# Patient Record
Sex: Male | Born: 1965 | Race: Black or African American | Hispanic: No | Marital: Single | Smoking: Current some day smoker
Health system: Southern US, Community
[De-identification: ages and names within clinical notes are randomized; demographics above are authoritative.]

## PROBLEM LIST (undated history)

## (undated) DIAGNOSIS — G709 Myoneural disorder, unspecified: Secondary | ICD-10-CM

## (undated) DIAGNOSIS — R7303 Prediabetes: Secondary | ICD-10-CM

## (undated) DIAGNOSIS — N179 Acute kidney failure, unspecified: Secondary | ICD-10-CM

## (undated) DIAGNOSIS — F319 Bipolar disorder, unspecified: Secondary | ICD-10-CM

## (undated) DIAGNOSIS — E785 Hyperlipidemia, unspecified: Secondary | ICD-10-CM

## (undated) DIAGNOSIS — F419 Anxiety disorder, unspecified: Secondary | ICD-10-CM

## (undated) DIAGNOSIS — K635 Polyp of colon: Secondary | ICD-10-CM

## (undated) DIAGNOSIS — F32A Depression, unspecified: Secondary | ICD-10-CM

## (undated) DIAGNOSIS — M1990 Unspecified osteoarthritis, unspecified site: Secondary | ICD-10-CM

## (undated) DIAGNOSIS — G629 Polyneuropathy, unspecified: Secondary | ICD-10-CM

## (undated) DIAGNOSIS — K219 Gastro-esophageal reflux disease without esophagitis: Secondary | ICD-10-CM

## (undated) DIAGNOSIS — F209 Schizophrenia, unspecified: Secondary | ICD-10-CM

## (undated) DIAGNOSIS — B1910 Unspecified viral hepatitis B without hepatic coma: Secondary | ICD-10-CM

## (undated) DIAGNOSIS — F329 Major depressive disorder, single episode, unspecified: Secondary | ICD-10-CM

## (undated) DIAGNOSIS — I10 Essential (primary) hypertension: Secondary | ICD-10-CM

## (undated) DIAGNOSIS — F250 Schizoaffective disorder, bipolar type: Secondary | ICD-10-CM

## (undated) DIAGNOSIS — Z21 Asymptomatic human immunodeficiency virus [HIV] infection status: Secondary | ICD-10-CM

## (undated) DIAGNOSIS — B20 Human immunodeficiency virus [HIV] disease: Secondary | ICD-10-CM

## (undated) HISTORY — DX: Hyperlipidemia, unspecified: E78.5

## (undated) HISTORY — PX: HEMORROIDECTOMY: SUR656

## (undated) HISTORY — DX: Polyp of colon: K63.5

## (undated) HISTORY — DX: Essential (primary) hypertension: I10

## (undated) HISTORY — PX: TUMOR REMOVAL: SHX12

## (undated) HISTORY — DX: Anxiety disorder, unspecified: F41.9

## (undated) HISTORY — PX: COLONOSCOPY: SHX174

## (undated) HISTORY — DX: Gastro-esophageal reflux disease without esophagitis: K21.9

---

## 1997-09-12 ENCOUNTER — Encounter: Payer: Self-pay | Admitting: Emergency Medicine

## 1997-09-12 ENCOUNTER — Emergency Department (HOSPITAL_COMMUNITY): Admission: EM | Admit: 1997-09-12 | Discharge: 1997-09-12 | Payer: Self-pay | Admitting: Emergency Medicine

## 2000-01-11 ENCOUNTER — Emergency Department (HOSPITAL_COMMUNITY): Admission: EM | Admit: 2000-01-11 | Discharge: 2000-01-11 | Payer: Self-pay

## 2000-03-31 ENCOUNTER — Encounter: Payer: Self-pay | Admitting: Family Medicine

## 2000-03-31 ENCOUNTER — Ambulatory Visit (HOSPITAL_COMMUNITY): Admission: RE | Admit: 2000-03-31 | Discharge: 2000-03-31 | Payer: Self-pay | Admitting: Family Medicine

## 2000-04-15 ENCOUNTER — Emergency Department (HOSPITAL_COMMUNITY): Admission: EM | Admit: 2000-04-15 | Discharge: 2000-04-15 | Payer: Self-pay

## 2000-06-28 ENCOUNTER — Encounter: Payer: Self-pay | Admitting: Emergency Medicine

## 2000-06-28 ENCOUNTER — Inpatient Hospital Stay (HOSPITAL_COMMUNITY): Admission: EM | Admit: 2000-06-28 | Discharge: 2000-06-30 | Payer: Self-pay | Admitting: Emergency Medicine

## 2000-06-28 ENCOUNTER — Encounter: Payer: Self-pay | Admitting: Internal Medicine

## 2001-01-24 ENCOUNTER — Emergency Department (HOSPITAL_COMMUNITY): Admission: EM | Admit: 2001-01-24 | Discharge: 2001-01-24 | Payer: Self-pay | Admitting: Emergency Medicine

## 2001-07-16 ENCOUNTER — Emergency Department (HOSPITAL_COMMUNITY): Admission: EM | Admit: 2001-07-16 | Discharge: 2001-07-16 | Payer: Self-pay | Admitting: Emergency Medicine

## 2001-07-16 ENCOUNTER — Encounter: Payer: Self-pay | Admitting: Emergency Medicine

## 2011-08-11 ENCOUNTER — Emergency Department (HOSPITAL_COMMUNITY)
Admission: EM | Admit: 2011-08-11 | Discharge: 2011-08-11 | Disposition: A | Discharge status: Home / Self Care | Payer: Medicaid Other | Attending: Emergency Medicine | Admitting: Emergency Medicine

## 2011-08-11 ENCOUNTER — Encounter (HOSPITAL_COMMUNITY): Payer: Self-pay | Admitting: Family Medicine

## 2011-08-11 DIAGNOSIS — Z21 Asymptomatic human immunodeficiency virus [HIV] infection status: Secondary | ICD-10-CM | POA: Insufficient documentation

## 2011-08-11 DIAGNOSIS — F259 Schizoaffective disorder, unspecified: Secondary | ICD-10-CM

## 2011-08-11 DIAGNOSIS — Z79899 Other long term (current) drug therapy: Secondary | ICD-10-CM | POA: Insufficient documentation

## 2011-08-11 DIAGNOSIS — F1410 Cocaine abuse, uncomplicated: Secondary | ICD-10-CM

## 2011-08-11 HISTORY — DX: Asymptomatic human immunodeficiency virus (hiv) infection status: Z21

## 2011-08-11 HISTORY — DX: Human immunodeficiency virus (HIV) disease: B20

## 2011-08-11 LAB — CBC WITH DIFFERENTIAL/PLATELET
Basophils Absolute: 0 10*3/uL (ref 0.0–0.1)
Basophils Relative: 0 % (ref 0–1)
Eosinophils Relative: 0 % (ref 0–5)
HCT: 41.8 % (ref 39.0–52.0)
MCHC: 35.2 g/dL (ref 30.0–36.0)
MCV: 87.3 fL (ref 78.0–100.0)
Monocytes Absolute: 0.6 10*3/uL (ref 0.1–1.0)
RDW: 12.5 % (ref 11.5–15.5)

## 2011-08-11 LAB — POCT I-STAT, CHEM 8
HCT: 44 % (ref 39.0–52.0)
Hemoglobin: 15 g/dL (ref 13.0–17.0)
Potassium: 4 mEq/L (ref 3.5–5.1)
Sodium: 142 mEq/L (ref 135–145)

## 2011-08-11 LAB — ACETAMINOPHEN LEVEL: Acetaminophen (Tylenol), Serum: <15 ug/mL (ref 10–30)

## 2011-08-11 LAB — ETHANOL: Alcohol, Ethyl (B): <11 mg/dL (ref 0–11)

## 2011-08-11 MED ORDER — EMTRICITABINE-TENOFOVIR DF 200-300 MG PO TABS
1.0000 | ORAL_TABLET | Freq: Every day | ORAL | Status: DC
  Administered 2011-08-11: 1 via ORAL
  Filled 2011-08-11: qty 1

## 2011-08-11 MED ORDER — ACETAMINOPHEN 325 MG PO TABS: 650.0000 mg | ORAL_TABLET | ORAL | Status: DC | PRN

## 2011-08-11 MED ORDER — LORAZEPAM 1 MG PO TABS
1.0000 mg | ORAL_TABLET | Freq: Three times a day (TID) | ORAL | Status: DC | PRN
  Administered 2011-08-11: 1 mg via ORAL
  Filled 2011-08-11: qty 1

## 2011-08-11 MED ORDER — RITONAVIR 100 MG PO TABS
100.0000 mg | ORAL_TABLET | Freq: Two times a day (BID) | ORAL | Status: DC
  Filled 2011-08-11 (×2): qty 1

## 2011-08-11 MED ORDER — LURASIDONE HCL 40 MG PO TABS
40.0000 mg | ORAL_TABLET | Freq: Every day | ORAL | Status: DC
  Administered 2011-08-11: 40 mg via ORAL
  Filled 2011-08-11 (×4): qty 1

## 2011-08-11 MED ORDER — IBUPROFEN 600 MG PO TABS: 600.0000 mg | ORAL_TABLET | Freq: Three times a day (TID) | ORAL | Status: DC | PRN

## 2011-08-11 MED ORDER — ONDANSETRON HCL 4 MG PO TABS: 4.0000 mg | ORAL_TABLET | Freq: Three times a day (TID) | ORAL | Status: DC | PRN

## 2011-08-11 MED ORDER — NICOTINE 21 MG/24HR TD PT24: 21.0000 mg | MEDICATED_PATCH | Freq: Every day | TRANSDERMAL | Status: DC

## 2011-08-11 MED ORDER — ATAZANAVIR SULFATE 150 MG PO CAPS
300.0000 mg | ORAL_CAPSULE | Freq: Every day | ORAL | Status: DC
Start: 2011-08-11 — End: 2011-08-11
  Administered 2011-08-11: 300 mg via ORAL
  Filled 2011-08-11 (×2): qty 2

## 2011-08-11 MED ORDER — RITONAVIR 100 MG PO TABS
100.0000 mg | ORAL_TABLET | Freq: Two times a day (BID) | ORAL | Status: DC
  Administered 2011-08-11: 100 mg via ORAL
  Filled 2011-08-11 (×3): qty 1

## 2011-08-11 NOTE — ED Notes (Signed)
Patient anxious. States that "I wanted death." States that he is hearing voices and that the police just "snatched him up."

## 2011-08-11 NOTE — ED Notes (Signed)
Pt belongings placed in locker Psych 52.

## 2011-08-11 NOTE — ED Notes (Signed)
Pt up to bathroom and giving urine sample as well.

## 2011-08-11 NOTE — ED Provider Notes (Signed)
Patient has been cleared for discharge by Dr. Lenna Sciara of psychiatry. He denies any paranoia, SI or HI.  BP 139/82  Pulse 92  Temp 98.4 F (36.9 C) (Oral)  Resp 18  SpO2 97%   Ezequiel Essex, MD 08/11/11 1145

## 2011-08-11 NOTE — ED Notes (Signed)
Patient up to the desk asking for his clothes. Explained to patient that back in this section(psych ed) that he was only allowed to wear the blue scrubs. Patient states "I want to leave." Explained to him I would let the nurse know. RN Elmyra Ricks notified, will notify patient's nurse, Kenney Houseman.

## 2011-08-11 NOTE — ED Notes (Signed)
Pt could not urinate.

## 2011-08-11 NOTE — ED Notes (Signed)
Pt just walked up to doors leading out of the Psych ED and pushed on it to try to open it. Writer explained to pt that he cannot leave without the MD's permission. Pt said he can't stay here and where's the MD. Writer ensured pt that people are working on his case.

## 2011-08-11 NOTE — ED Notes (Signed)
Pt and belongings searched and wand by security. Placed in locker 1 triage. 1 white bag and a blue back pk.

## 2011-08-11 NOTE — ED Provider Notes (Signed)
History     CSN: [id number]  Arrival date & time 08/11/11  0354   First MD Initiated Contact with Patient 08/11/11 [phone]      Chief Complaint  Patient presents with  . Medical Clearance    (Consider location/radiation/quality/duration/timing/severity/associated sxs/prior treatment) Patient is a 46 y.o. male presenting with mental health disorder. The history is provided by the patient and the police. No language interpreter was used.  Mental Health Problem The primary symptoms include hallucinations. Episode onset: unknown. Chronicity: unknown.  The hallucinations began more than 1 month ago. The hallucinations appear to have been worsening since their onset. He has auditory hallucinations.  Precipitated by: unknown. The degree of incapacity that he is experiencing as a consequence of his illness is severe. Sequelae of the illness include an inability to care for self. Additional symptoms of the illness include flight of ideas and patient exhibits inattention/distractibility. Additional symptoms of the illness do not include no anhedonia or no insomnia. He admits to suicidal ideas. He does not have a plan to commit suicide. He does not contemplate harming himself. He has not already injured self. He does not contemplate injuring another person. He has not already  injured another person. Risk factors: none.    Past Medical History  Diagnosis Date  . Human immunodeficiency virus (HIV)     Past Surgical History  Procedure Date  . Tumor removal     From Chest    No family history on file.  History  Substance Use Topics  . Smoking status: Never Smoker   . Smokeless tobacco: Not on file  . Alcohol Use: No      Review of Systems  Psychiatric/Behavioral: Positive for hallucinations. Negative for self-injury. The patient does not have insomnia.   All other systems reviewed and are negative.    Allergies  Penicillins  Home Medications   Current Outpatient Rx  Name Route  Sig Dispense Refill  . ATAZANAVIR SULFATE 300 MG PO CAPS Oral Take 300 mg by mouth daily with breakfast.    . EMTRICITABINE-TENOFOVIR 200-300 MG PO TABS Oral Take 1 tablet by mouth daily.    Marland Kitchen LURASIDONE HCL 40 MG PO TABS Oral Take 40 mg by mouth daily with breakfast.    . RITONAVIR 100 MG PO CAPS Oral Take 100 mg by mouth 2 (two) times daily.      BP 139/82  Pulse 92  Temp 98.4 F (36.9 C) (Oral)  Resp 18  SpO2 97%  Physical Exam  Constitutional: He appears well-developed and well-nourished. No distress.  HENT:  Head: Normocephalic and atraumatic.  Mouth/Throat: Oropharynx is clear and moist.  Eyes: EOM are normal. Pupils are equal, round, and reactive to light.  Neck: Normal range of motion. Neck supple.  Cardiovascular: Normal rate and regular rhythm.   Pulmonary/Chest: Effort normal and breath sounds normal. He has no wheezes. He has no rales.  Abdominal: Soft. Bowel sounds are normal. There is no tenderness. There is no rebound and no guarding.  Musculoskeletal: Normal range of motion.  Neurological: He is alert.  Skin: Skin is warm and dry.  Psychiatric: He is agitated.    ED Course  Procedures (including critical care time)   Labs Reviewed  CBC WITH DIFFERENTIAL  URINE RAPID DRUG SCREEN (HOSP PERFORMED)  ETHANOL  ACETAMINOPHEN LEVEL  SALICYLATE LEVEL   No results found.   No diagnosis found.    MDM  Will need inpatient psychiatric stabilization        Jabree Pernice K  Doyle, MD 08/11/11 [phone]

## 2011-08-11 NOTE — ED Notes (Signed)
Pt up to desk calling the [facility]

## 2011-08-11 NOTE — ED Notes (Signed)
Psychiatrist in talking with pt.

## 2011-08-11 NOTE — ED Notes (Signed)
Spoke to EDP about pt wanting to leave instead of being placed for treatment. EDP said to put him on IVC, he cannot leave. Also, spoke to ACT to let them know what the EDP said and also that the pt is concerned about losing his bed at [facility]. ACT will talk to Dr Lenna Sciara and let me know how to proceed.

## 2011-08-11 NOTE — ED Notes (Signed)
Per [employer], patient was picked up from the middle of [place] saying that "people are trying to kill me." Patient anxious.

## 2011-08-11 NOTE — ED Notes (Signed)
Palumbo, MD at bedside. 

## 2011-08-11 NOTE — ED Notes (Addendum)
Pt said he takes Norvir as well and that he has to take all three pills in order for them to work. Called EDP and he will order medication for pt. Pt also wants to call [facility] to have them hold his clothes and bed.

## 2011-08-11 NOTE — Consult Note (Signed)
Reason for Consult: Schizophrenia and cocaine abuse  Referring Physician: Dr. Leonie Green is an 46 y.o. male.  HPI: Patient was seen and chart reviewed. Patient has been suffering with the chronic schizophrenia and he he . paranoid type, who has been partially compliant with his medication. Patient wants to take a break from taking regular medications for few days at time and at the same time he want to socialize with the people who has been smoking pot or cocaine. Patient was found wandering in the middle of the street by the police, who brought him to the hospital. Patient was reportedly noncooperative and aggressive with the police officers. Patient has denied symptoms of depression, mania, anxiety and psychosis. He has no suicidal ideations or homicidal ideations. He was trying to keep himself safe by walking in the middle of the street. Patient endorses he used the Cocaine with the friends before hallucinations, paranoid and not taken medication for few days.   Patient has been receiving outpatient psychiatric services from the [facility]. He was positive for HIV for several years and been treated with medication. Patient has a sister, who is working as a Research officer, trade union but patient does not want to bother her. He was intended to live by himself, and currently staying at  [facility] for the last one week. Patient is concerned and worried about losing his position at [employer] if he stay in hospital. Patient contract for safety. He has a her mother and the 72 years old son who lives in [place]. Patient has been living in [place] since 1997. Reportedly he was on probation in the past, which he does not want to give details about it.   Past Medical History  Diagnosis Date  . Human immunodeficiency virus (HIV)     Past Surgical History  Procedure Date  . Tumor removal     From Chest    No family history on file.  Social History:  reports that he has  never smoked. He does not have any smokeless tobacco history on file. He reports that he does not drink alcohol or use illicit drugs.  Allergies:  Allergies  Allergen Reactions  . Penicillins     Medications: I have reviewed the patient's current medications.  Results for orders placed during the hospital encounter of 08/11/11 (from the past 48 hour(s))  CBC WITH DIFFERENTIAL     Status: Normal   Collection Time   08/11/11  4:50 AM      Component Value Range Comment   WBC 9.0  4.0 - 10.5 K/uL    RBC 4.79  4.22 - 5.81 MIL/uL    Hemoglobin 14.7  13.0 - 17.0 g/dL    HCT 41.8  39.0 - 52.0 %    MCV 87.3  78.0 - 100.0 fL    MCH 30.7  26.0 - 34.0 pg    MCHC 35.2  30.0 - 36.0 g/dL    RDW 12.5  11.5 - 15.5 %    Platelets 225  150 - 400 K/uL    Neutrophils Relative 75  43 - 77 %    Neutro Abs 6.8  1.7 - 7.7 K/uL    Lymphocytes Relative 18  12 - 46 %    Lymphs Abs 1.6  0.7 - 4.0 K/uL    Monocytes Relative 6  3 - 12 %    Monocytes Absolute 0.6  0.1 - 1.0 K/uL    Eosinophils Relative 0  0 -  5 %    Eosinophils Absolute 0.0  0.0 - 0.7 K/uL    Basophils Relative 0  0 - 1 %    Basophils Absolute 0.0  0.0 - 0.1 K/uL   ETHANOL     Status: Normal   Collection Time   08/11/11  4:50 AM      Component Value Range Comment   Alcohol, Ethyl (B) <11  0 - 11 mg/dL   ACETAMINOPHEN LEVEL     Status: Normal   Collection Time   08/11/11  4:50 AM      Component Value Range Comment   Acetaminophen (Tylenol), Serum <15.0  10 - 30 ug/mL   SALICYLATE LEVEL     Status: Abnormal   Collection Time   08/11/11  4:50 AM      Component Value Range Comment   Salicylate Lvl 123456 (*) 2.8 - 20.0 mg/dL   URINE RAPID DRUG SCREEN (HOSP PERFORMED)     Status: Abnormal   Collection Time   08/11/11  8:17 AM      Component Value Range Comment   Opiates NONE DETECTED  NONE DETECTED    Cocaine POSITIVE (*) NONE DETECTED    Benzodiazepines NONE DETECTED  NONE DETECTED    Amphetamines NONE DETECTED  NONE DETECTED     Tetrahydrocannabinol NONE DETECTED  NONE DETECTED    Barbiturates NONE DETECTED  NONE DETECTED     No results found.  No depression, No psychosis and Positive for illegal drug usage and non compliance Blood pressure 139/82, pulse 92, temperature 98.4 F (36.9 C), temperature source Oral, resp. rate 18, SpO2 97.00%.   Assessment/Plan: Schizoaffective disorder Cocaine abuse Partially compliant with medications  Paln; Discharge to out patient psychiatric services at [facility] and has no medication changes made during this visit. His current medication is Latuda 40mg  Qam for psychosis. No changes were made during this visit.  Juris Gosnell,JANARDHAHA R. 08/11/2011, 11:07 AM

## 2011-08-11 NOTE — ED Notes (Signed)
Pharmacy is putting in a Norvir dose to be given now.

## 2011-08-11 NOTE — BH Assessment (Signed)
Assessment Note   Dylan [name] is an 46 y.o. male. Patient reports that people from the grave are following him. Patient reports that he has not taken his medicine for 2 or 3 days. Patient was found wandering and was brought in by the police. Documentation in the file reports that he became aggressive with the police officers. Patient reports that he is not able to see the people following him. Patient states that he can only hear them. Patient denies any HI. Patient reports SI without a plan to hurt himself. Patient reports that he just wants to die. He does not want to hurt anyone else. Patient speech was rapid with flight of ideas and word salad. Patient was not able to maintain a single thought during the assessment. Patient denies any substance abuse. Patient reports receiving mental health services with a psychiatrist for medication management.   Pt had psychiatric evaluation with Dr. Louretta Shorten who recommended pt to be d/c with instructions to follow up with [facility], his current provider.  Axis I: Schizophrenia, unspecified and Polysubstance Abuse Axis II: Deferred Axis III:  Past Medical History  Diagnosis Date  . Human immunodeficiency virus (HIV)    Axis IV: economic problems, housing problems and other psychosocial or environmental problems Axis V: Please refer to Consult Note  Past Medical History:  Past Medical History  Diagnosis Date  . Human immunodeficiency virus (HIV)     Past Surgical History  Procedure Date  . 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  Social History:  reports that he has never smoked. He does not have any smokeless tobacco history on file. He reports that he does not drink alcohol or use illicit drugs.  Additional Social History:     CIWA: CIWA-Ar BP: 109/59 mmHg Pulse Rate: 76  COWS:    Allergies:  Allergies  Allergen Reactions  . Penicillins     Home Medications:  (Not in a hospital  admission)  OB/GYN Status:  No LMP for male patient.  General Assessment Data Location of Assessment: [facility] ACT Assessment: Yes Living Arrangements: Other (Comment) (Homeless - staying at [facility]) Can pt return to current living arrangement?: Yes Admission Status: Voluntary Is patient capable of signing voluntary admission?: Yes Transfer from: [facility] Referral Source: Self/Family/Friend  Education Status Is patient currently in school?: No  Risk to self Suicidal Ideation: Yes-Currently Present Suicidal Intent: Yes-Currently Present Is patient at risk for suicide?: Yes Suicidal Plan?: No-Not Currently/Within Last 6 Months Access to Means: No What has been your use of drugs/alcohol within the last 12 months?: none reported Previous Attempts/Gestures: No How many times?: 0  Other Self Harm Risks: 0 Triggers for Past Attempts: Unpredictable;Unknown Intentional Self Injurious Behavior: None Family Suicide History: Unknown Recent stressful life event(s): Other (Comment) Persecutory voices/beliefs?: Yes Depression: Yes Depression Symptoms: Fatigue;Isolating Substance abuse history and/or treatment for substance abuse?: No Suicide prevention information given to non-admitted patients: Yes  Risk to Others Homicidal Ideation: No Thoughts of Harm to Others: No Current Homicidal Intent: No Current Homicidal Plan: No Access to Homicidal Means: No Identified Victim: none reported History of harm to others?: No Assessment of Violence: On admission Violent Behavior Description: fighting with the police officer Does patient have access to weapons?: No Criminal Charges Pending?: No Does patient have a court date: No  Psychosis Hallucinations: Auditory Delusions: None noted  Mental Status Report Appear/Hygiene: Other (Comment) Eye Contact: Poor Motor Activity: Freedom  of movement Speech: Incoherent;Tangential;Word salad Level of Consciousness: Quiet/awake Mood:  Anxious;Depressed Affect: Apprehensive;Irritable Anxiety Level: Minimal Thought Processes: Circumstantial;Tangential;Flight of Ideas Judgement: Impaired Orientation: Not oriented Obsessive Compulsive Thoughts/Behaviors: None  Cognitive Functioning Concentration: Decreased Memory: Recent Impaired;Remote Impaired IQ: Average Insight: Poor Impulse Control: Poor Appetite: Fair Weight Loss: 0  Weight Gain: 0  Sleep: Decreased Total Hours of Sleep: 5  Vegetative Symptoms: None  ADLScreening [facility] Assessment Services) Patient's cognitive ability adequate to safely complete daily activities?: Yes Patient able to express need for assistance with ADLs?: Yes Independently performs ADLs?: Yes  Abuse/Neglect [facility]) Physical Abuse: Denies Verbal Abuse: Denies Sexual Abuse: Denies  Prior Inpatient Therapy Prior Inpatient Therapy: No Prior Therapy Dates: na Prior Therapy Facilty/Provider(s): na Reason for Treatment: na  Prior Outpatient Therapy Prior Outpatient Therapy: Yes Prior Therapy Dates: on going Prior Therapy Facilty/Provider(s): unable to remember Reason for Treatment: medication management   ADL Screening (condition at time of admission) Patient's cognitive ability adequate to safely complete daily activities?: Yes Patient able to express need for assistance with ADLs?: Yes Independently performs ADLs?: Yes       Abuse/Neglect Assessment (Assessment to be complete while patient is alone) Physical Abuse: Denies Verbal Abuse: Denies Sexual Abuse: Denies Values / Beliefs Cultural Requests During Hospitalization: None Spiritual Requests During Hospitalization: None        Additional Information 1:1 In Past 12 Months?: No CIRT Risk: No Elopement Risk: No Does patient have medical clearance?: Yes     Disposition:  Disposition Disposition of Patient: Referred to (Follow up with [facility]) Patient referred to: Other (Comment) (Psych MD referred pt back to  [facility])  On Site Evaluation by:   Reviewed with Physician:     Milagros Evener 08/11/2011 12:21 PM

## 2011-08-11 NOTE — BH Assessment (Signed)
Assessment Note   Dylan [name] is an 46 y.o. male.   Patient reports that people from the grave are following him.  Patient reports that he has not taken his medicine for 2 or 3 days.  Patient was found wandering and was brought in by the police.  Documentation in the file reports that he became aggressive with the police officers.  Patient reports that he is not able to see the people following him.  Patient states that he can only hear them.  Patient denies any HI. Patient reports SI without a plan to hurt himself. Patient reports that he just wants to die. He does not want to hurt anyone else. Patient speech was rapid with flight of ideas and word salad. Patient was not able to maintain a single thought during the assessment.  Patient denies any substance abuse. Patient reports receiving mental health services with a psychiatrist for medication management.   Axis I: Schizoaffective Disorder Axis II: Deferred Axis III:  Past Medical History  Diagnosis Date  . Human immunodeficiency virus (HIV)    Axis IV: economic problems, housing problems, occupational problems, other psychosocial or environmental problems, problems related to social environment, problems with access to health care services and problems with primary support group Axis V: 11-20 some danger of hurting self or others possible OR occasionally fails to maintain minimal personal hygiene OR gross impairment in communication  Past Medical History:  Past Medical History  Diagnosis Date  . Human immunodeficiency virus (HIV)     Past Surgical History  Procedure Date  . 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  Social History:  reports that he has never smoked. He does not have any smokeless tobacco history on file. He reports that he does not drink alcohol or use illicit drugs.  Additional Social History:     CIWA: CIWA-Ar BP: 139/82 mmHg Pulse Rate: 92  COWS:    Allergies:    Allergies  Allergen Reactions  . Penicillins     Home Medications:  (Not in a hospital admission)  OB/GYN Status:  No LMP for male patient.  General Assessment Data [facility] ACT Assessment: Yes Living Arrangements: Other (Comment) Can pt return to current living arrangement?: Yes Admission Status: Voluntary Is patient capable of signing voluntary admission?: Yes Transfer from: Other (Comment) Referral Source: Other  Education Status Is patient currently in school?: No  Risk to self Suicidal Ideation: Yes-Currently Present Suicidal Intent: Yes-Currently Present Is patient at risk for suicide?: Yes Suicidal Plan?: No-Not Currently/Within Last 6 Months Access to Means: No What has been your use of drugs/alcohol within the last 12 months?: none reported Previous Attempts/Gestures: No How many times?: 0  Other Self Harm Risks: 0 Triggers for Past Attempts: Unpredictable;Unknown Intentional Self Injurious Behavior: None Family Suicide History: Unknown Recent stressful life event(s): Other (Comment) Persecutory voices/beliefs?: Yes Depression: Yes Depression Symptoms: Fatigue;Isolating Substance abuse history and/or treatment for substance abuse?: No Suicide prevention information given to non-admitted patients: Yes  Risk to Others Homicidal Ideation: No Thoughts of Harm to Others: No Current Homicidal Intent: No Current Homicidal Plan: No Access to Homicidal Means: No Identified Victim: none reported History of harm to others?: No Assessment of Violence: On admission Violent Behavior Description: fighting with the police officer Does patient have access to weapons?: No Criminal Charges Pending?: No Does patient have a court date: No  Psychosis Hallucinations: Auditory Delusions: None noted  Mental Status Report Appear/Hygiene: Other (Comment) Eye Contact: Poor Speech: Incoherent;Tangential;Word salad Level of Consciousness:  Quiet/awake Mood: Anxious;Depressed Affect: Apprehensive;Irritable Anxiety Level: Minimal Thought Processes: Circumstantial;Tangential;Flight of Ideas Judgement: Impaired Orientation: Not oriented Obsessive Compulsive Thoughts/Behaviors: None  Cognitive Functioning Concentration: Decreased Memory: Recent Impaired;Remote Impaired IQ: Average Insight: Poor Impulse Control: Poor Appetite: Fair Weight Loss: 0  Weight Gain: 0  Sleep: Decreased Total Hours of Sleep: 5  Vegetative Symptoms: None  ADLScreening [facility] Assessment Services) Patient's cognitive ability adequate to safely complete daily activities?: Yes Patient able to express need for assistance with ADLs?: Yes Independently performs ADLs?: Yes  Abuse/Neglect [facility]) Physical Abuse: Denies Verbal Abuse: Denies Sexual Abuse: Denies  Prior Inpatient Therapy Prior Inpatient Therapy: No Prior Therapy Dates: na Prior Therapy Facilty/Provider(s): na Reason for Treatment: na  Prior Outpatient Therapy Prior Outpatient Therapy: Yes Prior Therapy Dates: on going Prior Therapy Facilty/Provider(s): unable to remember Reason for Treatment: medication management   ADL Screening (condition at time of admission) Patient's cognitive ability adequate to safely complete daily activities?: Yes Patient able to express need for assistance with ADLs?: Yes Independently performs ADLs?: Yes       Abuse/Neglect Assessment (Assessment to be complete while patient is alone) Physical Abuse: Denies Verbal Abuse: Denies Sexual Abuse: Denies Values / Beliefs Cultural Requests During Hospitalization: None Spiritual Requests During Hospitalization: None        Additional Information 1:1 In Past 12 Months?: No CIRT Risk: No Elopement Risk: No Does patient have medical clearance?: Yes     Disposition: Pending [facility] placement. Disposition Disposition of Patient: Referred to Patient referred to:  [facility])  On Site Evaluation by:    Reviewed with Physician:     Graciella Freer LaVerne 08/11/2011 5:16 AM

## 2011-08-17 ENCOUNTER — Encounter: Payer: Self-pay | Admitting: *Deleted

## 2011-08-17 DIAGNOSIS — Z21 Asymptomatic human immunodeficiency virus [HIV] infection status: Secondary | ICD-10-CM | POA: Insufficient documentation

## 2011-08-17 DIAGNOSIS — B20 Human immunodeficiency virus [HIV] disease: Secondary | ICD-10-CM | POA: Insufficient documentation

## 2011-08-18 ENCOUNTER — Encounter: Payer: Self-pay | Admitting: Internal Medicine

## 2011-08-18 ENCOUNTER — Ambulatory Visit (INDEPENDENT_AMBULATORY_CARE_PROVIDER_SITE_OTHER): Payer: Self-pay | Admitting: Internal Medicine

## 2011-08-18 VITALS — BP 127/83 | HR 80 | Temp 98.4°F | Ht 74.0 in | Wt 211.0 lb

## 2011-08-18 DIAGNOSIS — B20 Human immunodeficiency virus [HIV] disease: Secondary | ICD-10-CM

## 2011-08-18 DIAGNOSIS — Z113 Encounter for screening for infections with a predominantly sexual mode of transmission: Secondary | ICD-10-CM

## 2011-08-18 DIAGNOSIS — F259 Schizoaffective disorder, unspecified: Secondary | ICD-10-CM

## 2011-08-18 DIAGNOSIS — F319 Bipolar disorder, unspecified: Secondary | ICD-10-CM

## 2011-08-18 DIAGNOSIS — Z21 Asymptomatic human immunodeficiency virus [HIV] infection status: Secondary | ICD-10-CM

## 2011-08-18 DIAGNOSIS — IMO0002 Reserved for concepts with insufficient information to code with codable children: Secondary | ICD-10-CM

## 2011-08-18 LAB — CBC WITH DIFFERENTIAL/PLATELET
Basophils Relative: 0 % (ref 0–1)
Hemoglobin: 14.4 g/dL (ref 13.0–17.0)
Lymphs Abs: 1.9 10*3/uL (ref 0.7–4.0)
MCHC: 34.4 g/dL (ref 30.0–36.0)
Monocytes Relative: 9 % (ref 3–12)
Neutro Abs: 2.4 10*3/uL (ref 1.7–7.7)
Neutrophils Relative %: 50 % (ref 43–77)
RBC: 4.72 MIL/uL (ref 4.22–5.81)

## 2011-08-18 LAB — COMPREHENSIVE METABOLIC PANEL
ALT: 52 U/L (ref 0–53)
Albumin: 4 g/dL (ref 3.5–5.2)
Alkaline Phosphatase: 96 U/L (ref 39–117)
Glucose, Bld: 72 mg/dL (ref 70–99)
Potassium: 4.2 mEq/L (ref 3.5–5.3)
Sodium: 140 mEq/L (ref 135–145)
Total Protein: 7 g/dL (ref 6.0–8.3)

## 2011-08-18 MED ORDER — RITONAVIR 100 MG PO CAPS: 100.0000 mg | ORAL_CAPSULE | Freq: Two times a day (BID) | ORAL | Status: DC

## 2011-08-18 MED ORDER — ATAZANAVIR SULFATE 300 MG PO CAPS: 300.0000 mg | ORAL_CAPSULE | Freq: Every day | ORAL | Status: DC

## 2011-08-18 MED ORDER — EMTRICITABINE-TENOFOVIR DF 200-300 MG PO TABS: 1.0000 | ORAL_TABLET | Freq: Every day | ORAL | Status: DC

## 2011-08-19 LAB — HEPATITIS B CORE ANTIBODY, TOTAL: Hep B Core Total Ab: POSITIVE — AB

## 2011-08-21 DIAGNOSIS — IMO0002 Reserved for concepts with insufficient information to code with codable children: Secondary | ICD-10-CM | POA: Insufficient documentation

## 2011-08-21 LAB — HIV-1 RNA QUANT-NO REFLEX-BLD: HIV-1 RNA Quant, Log: <1.3 {Log} (ref <1.30)

## 2011-08-21 NOTE — Progress Notes (Signed)
HIV CLINIC   RFV: re-establishing care Subjective:    Patient ID: Dylan [name], male    DOB: May 03, 1965, 46 y.o.   MRN: [id number]  HPI 46 yo Male with HIV diagnosed in 1989, most recently on truvada/ATZ/r but stopped in late July when he was released from [place]. At that time his CD 4 count was 800s with undetectable VL. He also suffers from biplor/schizoaffective disorder recently seen in ED for auditory hallucinations. He is currently on lurasidone which helps manage his psychiatrict symptoms. He is to see a psychiatrist at the end of the month. He is currently living at [facility] , he can stay up to 67 days, currently been there for 3 wks. HE is going to [employer] to help with job search. He is doing well over all. He states that he has considerable back pain for DDD which he takes naprosyn. He is to meet with counselor to do ADAP paperwork.  Current Outpatient Prescriptions on File Prior to Visit  Medication Sig Dispense Refill  . lurasidone (LATUDA) 40 MG TABS Take 40 mg by mouth daily with breakfast.      - naprosyn PRN Active Ambulatory Problems    Diagnosis Date Noted  . HIV disease 08/17/2011   Resolved Ambulatory Problems    Diagnosis Date Noted  . No Resolved Ambulatory Problems   Past Medical History  Diagnosis Date  . Human immunodeficiency virus (HIV)     reports that he has never smoked. He uses smokeless tobacco. He reports that he does not drink alcohol or use illicit drugs.  family hx = diabetes  Review of Systems Review of Systems  Constitutional: Negative for fever, chills, diaphoresis, activity change, appetite change, fatigue and unexpected weight change.  HENT: Negative for congestion, sore throat, rhinorrhea, sneezing, trouble swallowing and sinus pressure.  Eyes: Negative for photophobia and visual disturbance.  Respiratory: Negative for cough, chest tightness, shortness of breath, wheezing and stridor.  Cardiovascular:  Negative for chest pain, palpitations and leg swelling.  Gastrointestinal: Negative for nausea, vomiting, abdominal pain, diarrhea, constipation, blood in stool, abdominal distention and anal bleeding.  Genitourinary: Negative for dysuria, hematuria, flank pain and difficulty urinating.  Musculoskeletal: positive for low back pain  Skin: Negative for color change, pallor, rash and wound.  Neurological: Negative for dizziness, tremors, weakness and light-headedness.  Hematological: Negative for adenopathy. Does not bruise/bleed easily.  Psychiatric/Behavioral: Negative for behavioral problems, confusion, sleep disturbance, dysphoric mood, decreased concentration and agitation.       Objective:   Physical Exam BP 127/83  Pulse 80  Temp 98.4 F (36.9 C) (Oral)  Ht 6\' 2"  (1.88 m)  Wt 211 lb (95.709 kg)  BMI 27.09 kg/m2 Physical Exam  Constitutional: He is oriented to person, place, and time. He appears well-developed and well-nourished. No distress.  HENT:  Mouth/Throat: Oropharynx is clear and moist. No oropharyngeal exudate.  Cardiovascular: Normal rate, regular rhythm and normal heart sounds. Exam reveals no gallop and no friction rub.  No murmur heard.  Pulmonary/Chest: Effort normal and breath sounds normal. No respiratory distress. He has no wheezes.  Abdominal: Soft. Bowel sounds are normal. He exhibits no distension. There is no tenderness.  Lymphadenopathy:  He has no cervical adenopathy.  Neurological: He is alert and oriented to person, place, and time.  Skin: Skin is warm and dry. No rash noted. No erythema.  Psychiatric: He has a normal mood and affect. His behavior is normal.      Assessment & Plan:  HIV = will restart is old regimen of truvada, atazanavir, ritonavir. Will get labs today including hiv genotype.  Bipolar/schizoaffective disorder = will ask patient how his psych appt went on follow up and if any new meds added on for his management  Health maintenance  = will check hepatitis a,b,c and see what he needs to be immunized against. Get flu vax at next visit   rtc in 6 wks.

## 2011-09-01 ENCOUNTER — Encounter (HOSPITAL_COMMUNITY): Payer: Self-pay | Admitting: *Deleted

## 2011-09-01 ENCOUNTER — Emergency Department (HOSPITAL_COMMUNITY)
Admission: EM | Admit: 2011-09-01 | Discharge: 2011-09-04 | Disposition: A | Discharge status: Other Acute Inpatient Hospital | Payer: Medicaid Other | Attending: Emergency Medicine | Admitting: Emergency Medicine

## 2011-09-01 DIAGNOSIS — F319 Bipolar disorder, unspecified: Secondary | ICD-10-CM | POA: Insufficient documentation

## 2011-09-01 DIAGNOSIS — F209 Schizophrenia, unspecified: Secondary | ICD-10-CM

## 2011-09-01 DIAGNOSIS — R45851 Suicidal ideations: Secondary | ICD-10-CM | POA: Insufficient documentation

## 2011-09-01 DIAGNOSIS — Z8659 Personal history of other mental and behavioral disorders: Secondary | ICD-10-CM | POA: Insufficient documentation

## 2011-09-01 DIAGNOSIS — Z21 Asymptomatic human immunodeficiency virus [HIV] infection status: Secondary | ICD-10-CM | POA: Insufficient documentation

## 2011-09-01 HISTORY — DX: Bipolar disorder, unspecified: F31.9

## 2011-09-01 HISTORY — DX: Unspecified viral hepatitis B without hepatic coma: B19.10

## 2011-09-01 HISTORY — DX: Schizophrenia, unspecified: F20.9

## 2011-09-01 LAB — COMPREHENSIVE METABOLIC PANEL
ALT: 73 U/L — ABNORMAL HIGH (ref 0–53)
AST: 129 U/L — ABNORMAL HIGH (ref 0–37)
Albumin: 3.9 g/dL (ref 3.5–5.2)
Alkaline Phosphatase: 97 U/L (ref 39–117)
Chloride: 104 mEq/L (ref 96–112)
Potassium: 4 mEq/L (ref 3.5–5.1)
Total Bilirubin: 0.7 mg/dL (ref 0.3–1.2)

## 2011-09-01 LAB — CBC
HCT: 41.5 % (ref 39.0–52.0)
Platelets: 158 10*3/uL (ref 150–400)
RDW: 13 % (ref 11.5–15.5)
WBC: 7.1 10*3/uL (ref 4.0–10.5)

## 2011-09-01 LAB — RAPID URINE DRUG SCREEN, HOSP PERFORMED
Amphetamines: NOT DETECTED
Tetrahydrocannabinol: NOT DETECTED

## 2011-09-01 MED ORDER — ATAZANAVIR SULFATE 150 MG PO CAPS
300.0000 mg | ORAL_CAPSULE | Freq: Every day | ORAL | Status: DC
  Administered 2011-09-01 – 2011-09-04 (×4): 300 mg via ORAL
  Filled 2011-09-01 (×5): qty 2

## 2011-09-01 MED ORDER — NAPROXEN 375 MG PO TABS
375.0000 mg | ORAL_TABLET | Freq: Two times a day (BID) | ORAL | Status: DC
  Administered 2011-09-01 – 2011-09-04 (×7): 375 mg via ORAL
  Filled 2011-09-01 (×8): qty 1

## 2011-09-01 MED ORDER — RITONAVIR 100 MG PO TABS
100.0000 mg | ORAL_TABLET | Freq: Every day | ORAL | Status: DC
  Administered 2011-09-02 – 2011-09-04 (×3): 100 mg via ORAL
  Filled 2011-09-01 (×4): qty 1

## 2011-09-01 MED ORDER — LURASIDONE HCL 40 MG PO TABS
40.0000 mg | ORAL_TABLET | Freq: Every day | ORAL | Status: DC
  Administered 2011-09-01 – 2011-09-04 (×4): 40 mg via ORAL
  Filled 2011-09-01 (×5): qty 1

## 2011-09-01 MED ORDER — RITONAVIR 100 MG PO TABS
100.0000 mg | ORAL_TABLET | Freq: Two times a day (BID) | ORAL | Status: DC
  Administered 2011-09-01: 100 mg via ORAL
  Filled 2011-09-01 (×2): qty 1

## 2011-09-01 MED ORDER — EMTRICITABINE-TENOFOVIR DF 200-300 MG PO TABS
1.0000 | ORAL_TABLET | Freq: Every day | ORAL | Status: DC
  Administered 2011-09-01 – 2011-09-04 (×4): 1 via ORAL
  Filled 2011-09-01 (×4): qty 1

## 2011-09-01 NOTE — ED Notes (Signed)
[id number] Expected date:09/01/11<BR> Expected time: 6:33 AM<BR> Means of arrival:Ambulance<BR> Comments:<BR> suicidal

## 2011-09-01 NOTE — ED Notes (Signed)
Pt belongings in 2 bags.   Clothing, wallet, and personal items.

## 2011-09-01 NOTE — ED Notes (Signed)
Pt. Belongings locked in lower cabinet in room 22.

## 2011-09-01 NOTE — ED Notes (Signed)
Care of pt assumed. EDP and RN in to assess pt. Pt reports SI r/t "the shadows following him." Denies HI. Sts he has been feeling this way "off and on for a few weeks" and has missed "a few of his med doses off and on."

## 2011-09-01 NOTE — ED Notes (Signed)
Per EMS called to [facility] by [employer]; [employer] had found pt in road that felt people from the graveyard was following him and to get away he had to fly; told [employer] he was going to a parking deck to fly

## 2011-09-01 NOTE — ED Provider Notes (Signed)
History     CSN: [id number]  Arrival date & time 09/01/11  [id number]   First MD Initiated Contact with Patient 09/01/11 0710      Chief Complaint  Patient presents with  . Medical Clearance    (Consider location/radiation/quality/duration/timing/severity/associated sxs/prior treatment) The history is provided by the patient and medical records.   55, old male, with history of schizophrenia, and HIV, presents to emergency department complaining of suicidal thoughts.  He, says shadows are following him and he feels as if he is to kill himself.  He was planning to jump off a parking back to kill himself.  He states that he takes antipsychotics, but has not had them recently.  Use cocaine 4 days ago, and alcohol.  2 days ago.  He denies other alcohol or drug use.  He denies recent illness.  Presently, he is asymptomatic.  Other than seeing shadows  Past Medical History  Diagnosis Date  . Human immunodeficiency virus (HIV)   . Hepatitis B   . Schizophrenia   . Bipolar 1 disorder     Past Surgical History  Procedure Date  . Tumor removal     From Chest    No family history on file.  History  Substance Use Topics  . Smoking status: Never Smoker   . Smokeless tobacco: Current User  . Alcohol Use: No     past      Review of Systems  Psychiatric/Behavioral:       Visual hallucinations SI  All other systems reviewed and are negative.    Allergies  Penicillins and Tape  Home Medications   Current Outpatient Rx  Name Route Sig Dispense Refill  . ATAZANAVIR SULFATE 300 MG PO CAPS Oral Take 1 capsule (300 mg total) by mouth daily with breakfast. 30 capsule 0  . EMTRICITABINE-TENOFOVIR 200-300 MG PO TABS Oral Take 1 tablet by mouth daily. 30 tablet 0  . LURASIDONE HCL 40 MG PO TABS Oral Take 40 mg by mouth daily with breakfast.    . NAPROXEN 375 MG PO TABS Oral Take 375 mg by mouth 2 (two) times daily with a meal. pain    . RITONAVIR 100 MG PO CAPS Oral Take 1 capsule (100  mg total) by mouth 2 (two) times daily. 60 capsule 0    BP 116/70  Pulse 85  Temp 98.4 F (36.9 C)  Resp 20  SpO2 97%  Physical Exam  Nursing note and vitals reviewed. Constitutional: He is oriented to person, place, and time. He appears well-developed and well-nourished. No distress.  HENT:  Head: Normocephalic and atraumatic.  Eyes: Conjunctivae are normal.  Neck: Normal range of motion. Neck supple.  Cardiovascular: Normal rate and intact distal pulses.   No murmur heard. Pulmonary/Chest: Effort normal and breath sounds normal.  Abdominal: Soft. He exhibits no distension. There is no tenderness.  Musculoskeletal: Normal range of motion.  Neurological: He is alert and oriented to person, place, and time.  Skin: Skin is warm and dry.  Psychiatric: He has a normal mood and affect. Thought content normal.    ED Course  Procedures (including critical care time) Acute psychosis with visual hallucinations and si.  Will do med clearance and consult psych.   Labs Reviewed  CBC  COMPREHENSIVE METABOLIC PANEL  ETHANOL  URINE RAPID DRUG SCREEN (HOSP PERFORMED)   No results found.   No diagnosis found.    MDM  Visual hallicinations SI        Barbara Cower, MD  09/01/11 0806 

## 2011-09-02 MED ORDER — LORAZEPAM 1 MG PO TABS
1.0000 mg | ORAL_TABLET | Freq: Four times a day (QID) | ORAL | Status: DC | PRN
  Administered 2011-09-02 – 2011-09-04 (×3): 1 mg via ORAL
  Filled 2011-09-02 (×3): qty 1

## 2011-09-02 NOTE — ED Notes (Signed)
Up in room pacing, reports he is having nervous energy

## 2011-09-02 NOTE — BH Assessment (Addendum)
Assessment Note   Dylan [name] is a 46 y.o. male who presents to [facility] voluntarily endorsing SI with a plan to jump off a bridge. Pt states he has been feeling this way for a few weeks. He reports he is "tired of life." Pt states he was brought to the ED by [employer] because they found him attempting to jump. He reporeated several times that he feels hopeless due to chronic illness. Pt also endorses [facility], stating he sees shadows and hears voices whispering and yelling at him. He states he is unable to make out what voices are telling him. He denies HI.   Pt endorses SA stating he used cocaine for the first time in ten years. He reports using 50 dollars worth "a few days ago." He reports he has no social supports in the area and currently lives in a shelter.   Telepsych has been requested to assist with disposition.     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 Human immunodeficiency virus (HIV)   . Hepatitis B   . Schizophrenia   . Bipolar 1 disorder    Axis IV: housing problems, other psychosocial or environmental problems and problems with primary support group Axis V: 31-40 impairment in reality testing  Past Medical History:  Past Medical History  Diagnosis Date  . Human immunodeficiency virus (HIV)   . Hepatitis B   . Schizophrenia   . Bipolar 1 disorder     Past Surgical History  Procedure Date  . 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  Social History:  reports that he has never smoked. He uses smokeless tobacco. He reports that he does not drink alcohol or use illicit drugs.  Additional Social History:  Alcohol / Drug Use History of alcohol / drug use?: Yes Substance #1 Name of Substance 1: alcohol 1 - Age of First Use: 20s 1 - Amount (size/oz): a couple of beers 1 - Frequency: 1-2 times weekly 1 - Duration: for several years 1 - Last Use / Amount:  09/01/11 Substance #2 Name of Substance 2: cocaine 2 - Age of First Use: 26 2 - Amount (size/oz): 50 dollars worth 2 - Frequency: hx of daily use, states relapsed after 10 years clean 2 - Duration: 3 days 2 - Last Use / Amount: 8/22  CIWA: CIWA-Ar BP: 123/74 mmHg Pulse Rate: 67  COWS:    Allergies:  Allergies  Allergen Reactions  . Penicillins Other (See Comments)    Childhood allergy  . Tape Rash    Home Medications:  (Not in a hospital admission)  OB/GYN Status:  No LMP for male patient.  General Assessment Data Location of Assessment: [facility] ED Living Arrangements: Other (Comment) (shelter) Can pt return to current living arrangement?: Yes Admission Status: Voluntary Is patient capable of signing voluntary admission?: Yes Transfer from: [facility] Referral Source: MD  Education Status Is patient currently in school?: No Highest grade of school patient has completed: 12  Risk to self Suicidal Ideation: Yes-Currently Present Suicidal Intent: Yes-Currently Present Is patient at risk for suicide?: Yes Suicidal Plan?: Yes-Currently Present Specify Current Suicidal Plan: plan to jump off bridge Access to Means: Yes Specify Access to Suicidal Means: bridge What has been your use of drugs/alcohol within the last 12 months?: cocaine and alcohol  Previous Attempts/Gestures: Yes How many times?: 1  Other  Self Harm Risks: none Triggers for Past Attempts: Unpredictable Intentional Self Injurious Behavior: None Family Suicide History: No Recent stressful life event(s): Other (Comment) (chronic health) Persecutory voices/beliefs?: No Depression: Yes Depression Symptoms: Despondent;Loss of interest in usual pleasures;Feeling worthless/self pity Substance abuse history and/or treatment for substance abuse?: No Suicide prevention information given to non-admitted patients: Not applicable  Risk to Others Homicidal Ideation: No Thoughts of Harm to Others: No Current  Homicidal Intent: No Current Homicidal Plan: No Access to Homicidal Means: No Identified Victim: none History of harm to others?: No Assessment of Violence: None Noted Violent Behavior Description: coopeative Does patient have access to weapons?: No Criminal Charges Pending?: Yes Describe Pending Criminal Charges: panhandling Does patient have a court date: Yes Court Date: 10/09/11  Psychosis Hallucinations: Auditory;Tactile Delusions: None noted  Mental Status Report Appear/Hygiene: Disheveled Eye Contact: Fair Motor Activity: Unremarkable Speech: Logical/coherent Level of Consciousness: Quiet/awake Mood: Depressed Affect: Appropriate to circumstance;Depressed Anxiety Level: None Thought Processes: Coherent;Relevant Judgement: Impaired Orientation: Person;Place;Time;Situation Obsessive Compulsive Thoughts/Behaviors: None  Cognitive Functioning Concentration: Normal Memory: Recent Intact;Remote Intact IQ: Average Insight: Fair Impulse Control: Fair Appetite: Fair Weight Loss: 0  Weight Gain: 0  Sleep: No Change Vegetative Symptoms: None  ADLScreening [facility] Assessment Services) Patient's cognitive ability adequate to safely complete daily activities?: Yes Patient able to express need for assistance with ADLs?: Yes Independently performs ADLs?: Yes (appropriate for developmental age)  Abuse/Neglect [facility]) Physical Abuse: Denies Verbal Abuse: Denies Sexual Abuse: Denies  Prior Inpatient Therapy Prior Inpatient Therapy: No Prior Therapy Dates: na Prior Therapy Facilty/Provider(s): na Reason for Treatment: na  Prior Outpatient Therapy Prior Outpatient Therapy: Yes Prior Therapy Dates: on going Prior Therapy Facilty/Provider(s): [facility] Reason for Treatment: medication managment  ADL Screening (condition at time of admission) Patient's cognitive ability adequate to safely complete daily activities?: Yes Patient able to express need for assistance with ADLs?:  Yes Independently performs ADLs?: Yes (appropriate for developmental age) Weakness of Legs: None Weakness of Arms/Hands: None  Home Assistive Devices/Equipment Home Assistive Devices/Equipment: None    Abuse/Neglect Assessment (Assessment to be complete while patient is alone) Physical Abuse: Denies Verbal Abuse: Denies Sexual Abuse: Denies Exploitation of patient/patient's resources: Denies Self-Neglect: Denies Values / Beliefs Cultural Requests During Hospitalization: None Spiritual Requests During Hospitalization: None   Advance Directives (For Healthcare) Advance Directive: Patient does not have advance directive;Patient would not like information Pre-existing out of facility DNR order (yellow form or pink MOST form): No Nutrition Screen- [facility] Patient's home diet: Regular Have you recently lost weight without trying?: No Have you been eating poorly because of a decreased appetite?: No Malnutrition Screening Tool Score: 0   Additional Information 1:1 In Past 12 Months?: No CIRT Risk: No Elopement Risk: No Does patient have medical clearance?: Yes     Disposition:  Disposition Disposition of Patient: Other dispositions (pending telepsych) telepsych recommends inpatient psychiatric treatment. On Site Evaluation by:   Reviewed with Physician:     Kaylyn Layer A 09/02/2011 3:43 AM

## 2011-09-02 NOTE — ED Notes (Signed)
Up to the bathroom 

## 2011-09-02 NOTE — ED Provider Notes (Addendum)
Pt resting comfortably, nad, vitals normal. Discussed w act team - awaiting placement.   Mirna Mires, MD 09/02/11 0757  Vitals normal, nad, resting. Awaiting act placement.   Mirna Mires, MD 09/04/11 [phone]

## 2011-09-03 NOTE — ED Notes (Signed)
Attempted to contact the specialist on call for the tele- psych and was unable to reach any on. Number called was busy- act team is aware. Will make day shift nurse aware

## 2011-09-03 NOTE — ED Notes (Signed)
Up to the bathroom 

## 2011-09-03 NOTE — ED Notes (Signed)
Up to the desk on the phone 

## 2011-09-03 NOTE — ED Notes (Signed)
Patient request to take a bath- gave towels and wash clothes. And changes of paper scrubs

## 2011-09-03 NOTE — ED Provider Notes (Addendum)
Pt is awake, in bed, NAD. Note stable VS.  No acute events reported and pt denies any complaints at this time.  Pt awaiting placement.  Perlie Mayo, MD 09/03/11 0819  1000.  Telepsych eval performed, pt recommended for inpt mgmnt.    Perlie Mayo, MD 09/03/11 1002

## 2011-09-04 ENCOUNTER — Encounter (HOSPITAL_COMMUNITY): Payer: Self-pay

## 2011-09-04 ENCOUNTER — Inpatient Hospital Stay (HOSPITAL_COMMUNITY)
Admission: AD | Admit: 2011-09-04 | Discharge: 2011-09-14 | DRG: 885 | Disposition: A | Discharge status: Home / Self Care | Payer: Federal, State, Local not specified - Other | Source: Ambulatory Visit | Attending: Psychiatry | Admitting: Psychiatry

## 2011-09-04 DIAGNOSIS — Z79899 Other long term (current) drug therapy: Secondary | ICD-10-CM

## 2011-09-04 DIAGNOSIS — IMO0002 Reserved for concepts with insufficient information to code with codable children: Secondary | ICD-10-CM | POA: Diagnosis present

## 2011-09-04 DIAGNOSIS — F259 Schizoaffective disorder, unspecified: Principal | ICD-10-CM | POA: Diagnosis present

## 2011-09-04 DIAGNOSIS — F1410 Cocaine abuse, uncomplicated: Secondary | ICD-10-CM | POA: Diagnosis present

## 2011-09-04 DIAGNOSIS — B1910 Unspecified viral hepatitis B without hepatic coma: Secondary | ICD-10-CM | POA: Diagnosis present

## 2011-09-04 DIAGNOSIS — B20 Human immunodeficiency virus [HIV] disease: Secondary | ICD-10-CM | POA: Diagnosis present

## 2011-09-04 DIAGNOSIS — F251 Schizoaffective disorder, depressive type: Secondary | ICD-10-CM | POA: Diagnosis present

## 2011-09-04 DIAGNOSIS — F14159 Cocaine abuse with cocaine-induced psychotic disorder, unspecified: Secondary | ICD-10-CM | POA: Diagnosis present

## 2011-09-04 MED ORDER — NICOTINE 21 MG/24HR TD PT24
21.0000 mg | MEDICATED_PATCH | Freq: Every day | TRANSDERMAL | Status: DC
  Filled 2011-09-04 (×4): qty 1

## 2011-09-04 MED ORDER — MAGNESIUM HYDROXIDE 400 MG/5ML PO SUSP
30.0000 mL | Freq: Every day | ORAL | Status: DC | PRN
  Administered 2011-09-13: 30 mL via ORAL

## 2011-09-04 MED ORDER — QUETIAPINE FUMARATE 25 MG PO TABS: 25.0000 mg | ORAL_TABLET | Freq: Four times a day (QID) | ORAL | Status: DC | PRN

## 2011-09-04 MED ORDER — ATAZANAVIR SULFATE 150 MG PO CAPS
300.0000 mg | ORAL_CAPSULE | Freq: Every day | ORAL | Status: DC
  Administered 2011-09-05 – 2011-09-14 (×10): 300 mg via ORAL
  Filled 2011-09-04 (×11): qty 2

## 2011-09-04 MED ORDER — RITONAVIR 100 MG PO CAPS
100.0000 mg | ORAL_CAPSULE | Freq: Every day | ORAL | Status: DC
  Administered 2011-09-05 – 2011-09-14 (×10): 100 mg via ORAL
  Filled 2011-09-04 (×11): qty 1

## 2011-09-04 MED ORDER — LURASIDONE HCL 40 MG PO TABS
40.0000 mg | ORAL_TABLET | Freq: Every day | ORAL | Status: DC
  Administered 2011-09-05 – 2011-09-07 (×3): 40 mg via ORAL
  Filled 2011-09-04 (×4): qty 1

## 2011-09-04 MED ORDER — ACETAMINOPHEN 325 MG PO TABS: 650.0000 mg | ORAL_TABLET | Freq: Four times a day (QID) | ORAL | Status: DC | PRN

## 2011-09-04 MED ORDER — NAPROXEN 375 MG PO TABS
375.0000 mg | ORAL_TABLET | Freq: Two times a day (BID) | ORAL | Status: DC
Start: 2011-09-05 — End: 2011-09-14
  Administered 2011-09-05 – 2011-09-14 (×17): 375 mg via ORAL
  Filled 2011-09-04 (×21): qty 1

## 2011-09-04 MED ORDER — RITONAVIR 100 MG PO CAPS
100.0000 mg | ORAL_CAPSULE | Freq: Two times a day (BID) | ORAL | Status: DC
  Filled 2011-09-04 (×3): qty 1

## 2011-09-04 MED ORDER — ALUM & MAG HYDROXIDE-SIMETH 200-200-20 MG/5ML PO SUSP
30.0000 mL | ORAL | Status: DC | PRN
  Administered 2011-09-11 – 2011-09-12 (×2): 30 mL via ORAL

## 2011-09-04 MED ORDER — TRAZODONE HCL 50 MG PO TABS
50.0000 mg | ORAL_TABLET | Freq: Every evening | ORAL | Status: DC | PRN
  Administered 2011-09-04 – 2011-09-06 (×5): 50 mg via ORAL
  Filled 2011-09-04 (×9): qty 1

## 2011-09-04 MED ORDER — EMTRICITABINE-TENOFOVIR DF 200-300 MG PO TABS
1.0000 | ORAL_TABLET | Freq: Every day | ORAL | Status: DC
  Administered 2011-09-05 – 2011-09-14 (×10): 1 via ORAL
  Filled 2011-09-04 (×11): qty 1

## 2011-09-04 NOTE — Progress Notes (Signed)
Voluntary admission for a 46 y.o. Male with flat affect, depressed mood.  Pt. Reports that he has been drinking a 6 pack of beer daily and uses cocaine.  Reports that he had a plan to commit suicide by jumping off the highest building he could find.  Reports that he sees shadows that make him afraid.  Pt. Denies SI/HI and denies Auditory hallucinations and contracts for safety.  States that he has had a prior suicide attempt.  Pt. Given food and oriented to unit.

## 2011-09-04 NOTE — BH Assessment (Signed)
Assessment Note   Dylan [name] is an 46 y.o. male that has been accepted for inpatient treatment to [facility].  Pt continues to endorse SI with a plan to jump off of a bridge. Pt is willing and agreeable to treatment for mental health and substance abuse.  Notified [name] and nursing staff who are agreeable with transfer.  All paperwork completed and faxed to [facility].    Dylan [name] is a 46 y.o. male who presents to [facility] voluntarily endorsing SI with a plan to jump off a bridge. Pt states he has been feeling this way for a few weeks. He reports he is "tired of life." Pt states he was brought to the ED by [employer] because they found him attempting to jump. He reporeated several times that he feels hopeless due to chronic illness. Pt also endorses [facility], stating he sees shadows and hears voices whispering and yelling at him. He states he is unable to make out what voices are telling him. He denies HI.  Pt endorses SA stating he used cocaine for the first time in ten years. He reports using 50 dollars worth "a few days ago." He reports he has no social supports in the area and currently lives in a shelter.      Axis I: Bipolar, Depressed and Substance Axis II: Deferred Axis III:  Past Medical History  Diagnosis Date  . Human immunodeficiency virus (HIV)   . Hepatitis B   . Schizophrenia   . Bipolar 1 disorder    Axis IV: economic problems, other psychosocial or environmental problems, problems related to legal system/crime, problems related to social environment, problems with access to health care services and problems with primary support group Axis V: 21-30 behavior considerably influenced by delusions or hallucinations OR serious impairment in judgment, communication OR inability to function in almost all areas  Past Medical History:  Past Medical History  Diagnosis Date  . Human immunodeficiency virus (HIV)   . Hepatitis B   . Schizophrenia   . Bipolar 1 disorder     Past Surgical  History  Procedure Date  . 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  Social History:  reports that he has never smoked. He uses smokeless tobacco. He reports that he does not drink alcohol or use illicit drugs.  Additional Social History:  Alcohol / Drug Use History of alcohol / drug use?: Yes Substance #1 Name of Substance 1: alcohol 1 - Age of First Use: 20s 1 - Amount (size/oz): a couple of beers 1 - Frequency: 1-2 times weekly 1 - Duration: for several years 1 - Last Use / Amount: 09/01/11 Substance #2 Name of Substance 2: cocaine 2 - Age of First Use: 26 2 - Amount (size/oz): 50 dollars worth 2 - Frequency: hx of daily use, states relapsed after 10 years clean 2 - Duration: 3 days 2 - Last Use / Amount: 8/22  CIWA: CIWA-Ar BP: 114/66 mmHg Pulse Rate: 67  COWS:    Allergies:  Allergies  Allergen Reactions  . Penicillins Other (See Comments)    Childhood allergy  . Tape Rash    Home Medications:  (Not in a hospital admission)  OB/GYN Status:  No LMP for male patient.  General Assessment Data Location of Assessment: [facility] ED ACT Assessment: Yes Living Arrangements:  (Homeless shelter) Can pt return to current living arrangement?: Yes Admission Status: Voluntary Is patient capable of signing  voluntary admission?: Yes Transfer from: Acute Hospital Referral Source: MD  Education Status Is patient currently in school?: No Highest grade of school patient has completed: 12  Risk to self Suicidal Ideation: Yes-Currently Present Suicidal Intent: Yes-Currently Present Is patient at risk for suicide?: Yes Suicidal Plan?: Yes-Currently Present Specify Current Suicidal Plan: plans to jump off a bridge Access to Means: Yes Specify Access to Suicidal Means: bridges available when not in ED What has been your use of drugs/alcohol within the last 12 months?: Cocaine and Alcohol Previous Attempts/Gestures: Yes How many times?: 1    Other Self Harm Risks: none Triggers for Past Attempts: Unpredictable Intentional Self Injurious Behavior: None Family Suicide History: No Recent stressful life event(s): Recent negative physical changes Persecutory voices/beliefs?: No Depression: Yes Depression Symptoms: Guilt;Loss of interest in usual pleasures;Feeling worthless/self pity;Feeling angry/irritable Substance abuse history and/or treatment for substance abuse?: Yes Suicide prevention information given to non-admitted patients: Not applicable  Risk to Others Homicidal Ideation: No Thoughts of Harm to Others: No Current Homicidal Intent: No Current Homicidal Plan: No Access to Homicidal Means: No Identified Victim: none per pt History of harm to others?: No Assessment of Violence: None Noted Violent Behavior Description: none per pt Does patient have access to weapons?: No Criminal Charges Pending?: Yes Describe Pending Criminal Charges: Panhandling Does patient have a court date: Yes Court Date: 10/09/11  Psychosis Hallucinations: Auditory;Tactile;With command Delusions: None noted  Mental Status Report Appear/Hygiene: Improved Eye Contact: Fair Motor Activity: Unremarkable Speech: Logical/coherent Level of Consciousness: Quiet/awake Mood: Depressed;Ambivalent;Apathetic;Worthless, low self-esteem Affect: Appropriate to circumstance;Depressed Anxiety Level: None Thought Processes: Relevant Judgement: Impaired Orientation: Person;Place;Time;Situation Obsessive Compulsive Thoughts/Behaviors: None  Cognitive Functioning Concentration: Normal Memory: Recent Intact;Remote Intact IQ: Average Insight: Fair Impulse Control: Fair Appetite: Fair Weight Loss: 0  Weight Gain: 0  Sleep: No Change Total Hours of Sleep: 5  Vegetative Symptoms: None  ADLScreening [facility] Assessment Services) Patient's cognitive ability adequate to safely complete daily activities?: Yes Patient able to express need for  assistance with ADLs?: Yes Independently performs ADLs?: Yes (appropriate for developmental age)  Abuse/Neglect [facility]) Physical Abuse: Denies Verbal Abuse: Denies Sexual Abuse: Denies  Prior Inpatient Therapy Prior Inpatient Therapy: No Prior Therapy Dates: na Prior Therapy Facilty/Provider(s): na Reason for Treatment: na  Prior Outpatient Therapy Prior Outpatient Therapy: Yes Prior Therapy Dates: on going Prior Therapy Facilty/Provider(s): [facility] Reason for Treatment: medication managment  ADL Screening (condition at time of admission) Patient's cognitive ability adequate to safely complete daily activities?: Yes Patient able to express need for assistance with ADLs?: Yes Independently performs ADLs?: Yes (appropriate for developmental age) Weakness of Legs: None Weakness of Arms/Hands: None  Home Assistive Devices/Equipment Home Assistive Devices/Equipment: None    Abuse/Neglect Assessment (Assessment to be complete while patient is alone) Physical Abuse: Denies Verbal Abuse: Denies Sexual Abuse: Denies Exploitation of patient/patient's resources: Denies Self-Neglect: Denies Values / Beliefs Cultural Requests During Hospitalization: None Spiritual Requests During Hospitalization: None   Advance Directives (For Healthcare) Advance Directive: Patient does not have advance directive;Patient would not like information Pre-existing out of facility DNR order (yellow form or pink MOST form): No Nutrition Screen- [facility] Patient's home diet: Regular Have you recently lost weight without trying?: No Have you been eating poorly because of a decreased appetite?: No Malnutrition Screening Tool Score: 0   Additional Information 1:1 In Past 12 Months?: No CIRT Risk: No Elopement Risk: No Does patient have medical clearance?: Yes     Disposition: Awaiting transfer to [place]. Disposition Disposition  of Patient: Inpatient treatment program Type of inpatient  treatment program: Adult  On Site Evaluation by:   Reviewed with Physician:     Herbert Moors 09/04/2011 5:01 PM

## 2011-09-04 NOTE — Tx Team (Signed)
Initial Interdisciplinary Treatment Plan  PATIENT STRENGTHS: (choose at least two) Ability for insight Average or above average intelligence  PATIENT STRESSORS: Financial difficulties Legal issue Substance abuse   PROBLEM LIST: Problem List/Patient Goals Date to be addressed Date deferred Reason deferred Estimated date of resolution  Substance Abuse 09/04/11     Depression 09/04/11                                                DISCHARGE CRITERIA:  Improved stabilization in mood, thinking, and/or behavior Need for constant or close observation no longer present Reduction of life-threatening or endangering symptoms to within safe limits Verbal commitment to aftercare and medication compliance  PRELIMINARY DISCHARGE PLAN: Attend 12-step recovery group Return to previous living arrangement  PATIENT/FAMIILY INVOLVEMENT: This treatment plan has been presented to and reviewed with the patient, FENDER STANSBERRY, and/or family member The patient and family have been given the opportunity to ask questions and make suggestions.  Shourya Macpherson Dawkins 09/04/2011, 7:23 PM

## 2011-09-05 DIAGNOSIS — F1410 Cocaine abuse, uncomplicated: Secondary | ICD-10-CM

## 2011-09-05 DIAGNOSIS — F259 Schizoaffective disorder, unspecified: Principal | ICD-10-CM

## 2011-09-05 DIAGNOSIS — F251 Schizoaffective disorder, depressive type: Secondary | ICD-10-CM | POA: Diagnosis present

## 2011-09-05 DIAGNOSIS — F14159 Cocaine abuse with cocaine-induced psychotic disorder, unspecified: Secondary | ICD-10-CM | POA: Diagnosis present

## 2011-09-05 LAB — TSH: TSH: 2.189 u[IU]/mL (ref 0.350–4.500)

## 2011-09-05 MED ORDER — CITALOPRAM HYDROBROMIDE 20 MG PO TABS
20.0000 mg | ORAL_TABLET | Freq: Every day | ORAL | Status: DC
  Administered 2011-09-06 – 2011-09-11 (×6): 20 mg via ORAL
  Filled 2011-09-05 (×9): qty 1

## 2011-09-05 NOTE — Progress Notes (Signed)
Psychoeducational Group Note  Date:  09/05/2011 Time:  2000  Group Topic/Focus:   Wrap-Up Group:   The focus of this group is to help patients review their daily goal of treatment and discuss progress on daily workbooks.  Participation Level: Did Not Attend  Participation Quality:  Not Applicable  Affect:  Not Applicable  Cognitive:  Not Applicable  Insight:  Not Applicable  Engagement in Group: Not Applicable  Additional Comments:   Toney Sang 09/05/2011, 8:31 PM
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
## 2011-09-05 NOTE — Progress Notes (Signed)
[facility] Group Notes:  (Counselor/Nursing/MHT/Case Management/Adjunct)  09/05/2011 1:32 PM  Type of Therapy:  Group Therapy  Participation Level:  Active  Participation Quality:  Attentive and Sharing  Affect:  Appropriate  Cognitive:  Oriented  Insight:  Good  Engagement in Group:  Good  Engagement in Therapy:  Good  Modes of Intervention:  Education, Engineer, maintenance (IT), Support and Exploration  Summary of Progress/Problems: He talked about wishing he had known about community resources before coming into the hospital. He stated that being from [place], he could just walk out the door and have resources.   Marlen Koman, Caren Griffins 09/05/2011, 1:32 PM
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
## 2011-09-05 NOTE — H&P (Signed)
Psychiatric Admission Assessment Adult  Patient Identification:  Dylan [name] Date of Evaluation:  09/05/2011 Chief Complaint:  Psychotic Disorder NOS; Cocaine Abuse  History of Present Illness:  Time was spent today discussing with the patient his current symptoms. The patient states that he having difficulty initiating and maintaining sleep and reports a good appetite. He reports moderate feelings of sadness, anhedonia and depressed mood and denies current suicidal or homicidal ideations. He states he was experiencing suicidal ideations prior to admission but states these have resolved since admission. He denies any auditory or visual hallucinations or delusional thinking but states he was experiencing auditory hallucinations in the past. He reports mild anxiety symptoms today.   The patient reports that he was recently released from prison after serving 10 years for 1st Degree Kidnapping. He states he is homeless and needs assistance with housing. He also states to using cocaine prior to admission and has pending charges for drug paraphilia and panhandling.   Past Medical History:   Past Medical History  Diagnosis Date  . Human immunodeficiency virus (HIV)   . Hepatitis B   . Schizophrenia   . Bipolar 1 disorder    Allergies:   Allergies  Allergen Reactions  . Penicillins Other (See Comments)    Childhood allergy  . Tape Rash   PTA Medications: Prescriptions prior to admission  Medication Sig Dispense Refill  . atazanavir (REYATAZ) 300 MG capsule Take 1 capsule (300 mg total) by mouth daily with breakfast.  30 capsule  0  . emtricitabine-tenofovir (TRUVADA) 200-300 MG per tablet Take 1 tablet by mouth daily.  30 tablet  0  . lurasidone (LATUDA) 40 MG TABS Take 40 mg by mouth daily with breakfast.      . naproxen (NAPROSYN) 375 MG tablet Take 375 mg by mouth 2 (two) times daily with a meal. pain      . ritonavir (NORVIR) 100 MG capsule Take 1 capsule (100 mg total) by mouth 2  (two) times daily.  60 capsule  0   Consequences of Substance Abuse: Legal Consequences:    Social History: Please see social workers assessment.l  Family History:  History reviewed. No pertinent family history.  Mental Status Examination/Evaluation:  Demographic factors: Assessment Details  Time of Assessment: Admission  Information Obtained From: Patient   Current Mental Status: Current Mental Status: (denies)  Loss Factors: Loss Factors: Legal issues;Financial problems / change in socioeconomic status  Historical Factors: Historical Factors: Prior suicide attempts  Risk Reduction Factors: Risk Reduction Factors: Religious beliefs about death   CLINICAL FACTORS:  Depression: Anhedonia  Comorbid alcohol abuse/dependence  Hopelessness  Insomnia  Alcohol/Substance Abuse/Dependencies  More than one psychiatric diagnosis  Unstable or Poor Therapeutic Relationship  Previous Psychiatric Diagnoses and Treatments  Medical Diagnoses and Treatments/Surgeries   COGNITIVE FEATURES THAT CONTRIBUTE TO RISK:  None Noted.  Current Mental Status Per Physician:   Diagnosis:  Axis I:   Schizoaffective Disorder - Depressed Type.    Cocaine Abuse.  Axis II:  Deferred. Axis III: 1.  HIV Positive.   2.  Hepatitis A Positive.   3.  Hepatitis B Positive.   4.  Degenerative Disk Disease. Axis IV: Chronic Mental Illness.  Chronic Serious Health Issues.  Unemployment.  Homelessness.  Film/video editor. Axis V:  GAF at time of admission approximately 35.  Highest GAF in the past year approximately 55.  The patient was seen today and reports the following:   ADL's: Intact.  Sleep: The patient reports to having  significant difficulty initiating and maintaining sleep.  Appetite: The patient reports a good appetite today.   Mild>(1-10) >Severe  Hopelessness (1-10): 4  Depression (1-10): 5  Anxiety (1-10): 3   Suicidal Ideation: The patient denies any suicidal ideations today.  Plan: No   Intent: No  Means: No   Homicidal Ideation: The patient denies any homicidal ideations today.  Plan: No  Intent: No.  Means: No   General Appearance/Behavior: The patient was friendly and cooperative today with this provider.  Eye Contact: Good.  Speech: Appropriate in rate and volume with no pressuring of speech noted today.  Motor Behavior: wnl.  Level of Consciousness: Alert and Oriented x 3.  Mental Status: Alert and Oriented x 3.  Mood: Moderately Depressed.  Affect: Moderately Constricted.  Anxiety Level: Mild anxiety reported today.  Thought Process: wnl.  Thought Content: The patient denies any current auditory or visual hallucinations or delusional thinking.  Perception: wnl.  Judgment: Fair to Good.  Insight: Fair to Good.  Cognition: Oriented to person, place and time.   Current Medications:   .  atazanavir  300 mg  Oral  Q breakfast   .  emtricitabine-tenofovir  1 tablet  Oral  Daily   .  lurasidone  40 mg  Oral  Q breakfast   .  naproxen  375 mg  Oral  BID WC   .  nicotine  21 mg  Transdermal  Q0600   .  ritonavir  100 mg  Oral  Q breakfast   .  traZODone  50 mg  Oral  QHS,MR X 1   .  DISCONTD: ritonavir  100 mg  Oral  BID    Review of Systems:  Neurological: No headaches, seizures or dizziness reported.  G.I.: The patient denies any constipation or stomach upset today.  Musculoskeletal: The patient reports chronic back pain from degenerative disk disease.   Time was spent today discussing with the patient his current symptoms. The patient states that he having difficulty initiating and maintaining sleep and reports a good appetite. He reports moderate feelings of sadness, anhedonia and depressed mood and denies current suicidal or homicidal ideations. He states he was experiencing suicidal ideations prior to admission but states these have resolved since admission. He denies any auditory or visual hallucinations or delusional thinking but states he was  experiencing auditory hallucinations in the past. He reports mild anxiety symptoms today.  The patient reports that he was recently released from prison after serving 10 years for 1st Degree Kidnapping. He states he is homeless and needs assistance with housing. He also states to using cocaine prior to admission and has pending charges for drug paraphilia and panhandling.   Treatment Plan Summary:  1. Daily contact with patient to assess and evaluate symptoms and progress in treatment.  2. Medication management  3. The patient will deny suicidal ideations or homicidal ideations for 48 hours prior to discharge and have a depression and anxiety rating of 3 or less. The patient will also deny any auditory or visual hallucinations or delusional thinking.  4. The patient will deny any symptoms of substance withdrawal at time of discharge.   Plan:  1. Will restart the medication atazanavir 300 mgs po q am for HIV.  2. Will restart the medication emtricitabine-tenofovir 1 tablet po q day for HIV.  3. Will restart the medication ritonavir 100 mgs po q am for HIV.  4. Will restart the medication lurasidone 40 mgs po q am for auditory  hallucinations.  5. Will continue the medication naproxen 375 mgs po BID-WC for chronic back pain.  6. Will continue the medication trazodone at 50 mgs po qhs for sleep with a repeat.  7. Will start Celexa at 20 mgs po q am for depression and anxiety.  8. Laboratory Studies reviewed.  9. Will continue to monitor.   SUICIDE RISK:  Minimal: No identifiable suicidal ideation. Patients presenting with no risk factors but with morbid ruminations; may be classified as minimal risk based on the severity of the depressive symptoms.  Current Medications:  Current Facility-Administered Medications  Medication Dose Route Frequency Provider Last Rate Last Dose  . acetaminophen (TYLENOL) tablet 650 mg  650 mg Oral Q6H PRN Laverle Hobby, PA      . alum & mag hydroxide-simeth  (MAALOX/MYLANTA) 200-200-20 MG/5ML suspension 30 mL  30 mL Oral Q4H PRN Laverle Hobby, PA      . atazanavir (REYATAZ) capsule 300 mg  300 mg Oral Q breakfast Milana Huntsman Brandey Vandalen, MD   300 mg at 09/05/11 0937  . citalopram (CELEXA) tablet 20 mg  20 mg Oral Q breakfast Milana Huntsman Robbi Scurlock, MD      . emtricitabine-tenofovir (TRUVADA) 200-300 MG per tablet 1 tablet  1 tablet Oral Daily Mellissa Kohut, MD   1 tablet at 09/05/11 [phone]  . lurasidone (LATUDA) tablet 40 mg  40 mg Oral Q breakfast Milana Huntsman Shakeena Kafer, MD   40 mg at 09/05/11 0937  . magnesium hydroxide (MILK OF MAGNESIA) suspension 30 mL  30 mL Oral Daily PRN Laverle Hobby, PA      . naproxen (NAPROSYN) tablet 375 mg  375 mg Oral BID WC Milana Huntsman Cadee Agro, MD   375 mg at 09/05/11 1706  . nicotine (NICODERM CQ - dosed in mg/24 hours) patch 21 mg  21 mg Transdermal Q0600 Milana Huntsman Catelyn Friel, MD      . ritonavir (NORVIR) capsule 100 mg  100 mg Oral Q breakfast Milana Huntsman Gennie Dib, MD   100 mg at 09/05/11 0936  . traZODone (DESYREL) tablet 50 mg  50 mg Oral QHS,MR X 1 Milana Huntsman Seanmichael Salmons, MD   50 mg at 09/04/11 2125  . DISCONTD: QUEtiapine (SEROQUEL) tablet 25 mg  25 mg Oral QID PRN Laverle Hobby, PA      . DISCONTD: ritonavir (NORVIR) capsule 100 mg  100 mg Oral BID Laverle Hobby, PA       Facility-Administered Medications Ordered in Other Encounters  Medication Dose Route Frequency Provider Last Rate Last Dose  . DISCONTD: atazanavir (REYATAZ) capsule 300 mg  300 mg Oral Q breakfast Barbara Cower, MD   300 mg at 09/04/11 0839  . DISCONTD: emtricitabine-tenofovir (TRUVADA) 200-300 MG per tablet 1 tablet  1 tablet Oral Daily Barbara Cower, MD   1 tablet at 09/04/11 [phone]  . DISCONTD: LORazepam (ATIVAN) tablet 1 mg  1 mg Oral Q6H PRN Mirna Mires, MD   1 mg at 09/04/11 0934  . DISCONTD: lurasidone (LATUDA) tablet 40 mg  40 mg Oral Q breakfast Barbara Cower, MD   40 mg at 09/04/11 0839  . DISCONTD: naproxen (NAPROSYN) tablet 375 mg  375 mg Oral  BID WC Barbara Cower, MD   375 mg at 09/04/11 1628  . DISCONTD: ritonavir (NORVIR) tablet 100 mg  100 mg Oral Q breakfast Barbara Cower, MD   100 mg at 09/04/11 [id number]    Physical Examination:  The patient was seen and evaluated in the Emergency  Department prior to his admission to [facility].  Please see the ED notes for more details.  Colton Tassin 8/27/20136:03 PM

## 2011-09-05 NOTE — Progress Notes (Signed)
Psychoeducational Group Note  Date:  09/05/2011 Time:  1100  Group Topic/Focus:  Recovery Goals:   The focus of this group is to identify appropriate goals for recovery and establish a plan to achieve them.  Participation Level:  Minimal  Participation Quality:  Drowsy and Inattentive  Affect:  Flat  Cognitive:  Oriented  Insight:  None  Engagement in Group:  None  Additional Comments:  Pt attended group this morning but appeared to be sleepy as he had a hard time staying awake.  Lukka Black E 09/05/2011, 1:33 PM

## 2011-09-05 NOTE — [facility] Suicide Risk Assessment (Signed)
Suicide Risk Assessment  Admission Assessment     Demographic factors:  Assessment Details Time of Assessment: Admission Information Obtained From: Patient Current Mental Status:  Current Mental Status:  (denies) Loss Factors:  Loss Factors: Legal issues;Financial problems / change in socioeconomic status Historical Factors:  Historical Factors: Prior suicide attempts Risk Reduction Factors:  Risk Reduction Factors: Religious beliefs about death  CLINICAL FACTORS:   Depression:   Anhedonia Comorbid alcohol abuse/dependence Hopelessness Insomnia Alcohol/Substance Abuse/Dependencies More than one psychiatric diagnosis Unstable or Poor Therapeutic Relationship Previous Psychiatric Diagnoses and Treatments Medical Diagnoses and Treatments/Surgeries  COGNITIVE FEATURES THAT CONTRIBUTE TO RISK:  None Noted.   Current Mental Status Per Physician:   Diagnosis:  Axis I: Schizoaffective Disorder - Depressed Type.  Cocaine Abuse.  The patient was seen today and reports the following:   ADL's: Intact.  Sleep: The patient reports to having significant difficulty initiating and maintaining sleep.  Appetite: The patient reports a good appetite today.   Mild>(1-10) >Severe  Hopelessness (1-10): 4  Depression (1-10): 5  Anxiety (1-10): 3   Suicidal Ideation: The patient denies any suicidal ideations today.  Plan: No  Intent: No  Means: No   Homicidal Ideation: The patient denies any homicidal ideations today.  Plan: No  Intent: No.  Means: No   General Appearance/Behavior: The patient was friendly and cooperative today with this provider.  Eye Contact: Good.  Speech: Appropriate in rate and volume with no pressuring of speech noted today.  Motor Behavior: wnl.  Level of Consciousness: Alert and Oriented x 3.  Mental Status: Alert and Oriented x 3.  Mood: Moderately Depressed.  Affect: Moderately Constricted.  Anxiety Level: Mild anxiety reported today.  Thought Process:   wnl. Thought Content: The patient denies any current auditory or visual hallucinations or delusional thinking.  Perception:  wnl.  Judgment: Fair to Good.  Insight:  Fair to Good.  Cognition: Oriented to person, place and time.   Current Medications:    . atazanavir  300 mg Oral Q breakfast  . emtricitabine-tenofovir  1 tablet Oral Daily  . lurasidone  40 mg Oral Q breakfast  . naproxen  375 mg Oral BID WC  . nicotine  21 mg Transdermal Q0600  . ritonavir  100 mg Oral Q breakfast  . traZODone  50 mg Oral QHS,MR X 1  . DISCONTD: ritonavir  100 mg Oral BID   Review of Systems:  Neurological: No headaches, seizures or dizziness reported.  G.I.: The patient denies any constipation or stomach upset today.  Musculoskeletal: The patient reports chronic back pain from degenerative disk disease.   Time was spent today discussing with the patient his current symptoms.  The patient states that he having difficulty initiating and maintaining sleep and reports a good appetite.  He reports moderate feelings of sadness, anhedonia and depressed mood and denies current suicidal or homicidal ideations.  He states he was experiencing suicidal ideations prior to admission but states these have resolved since admission.  He denies any auditory or visual hallucinations or delusional thinking but states he was experiencing auditory hallucinations in the past.  He reports mild anxiety symptoms today.  The patient reports that he was recently released from prison after serving 10 years for 1st Degree Kidnapping.  He states he is homeless and needs assistance with housing. He also states to using cocaine prior to admission and has pending charges for drug paraphilia and panhandling.  Treatment Plan Summary:  1. Daily contact with patient to assess  and evaluate symptoms and progress in treatment.  2. Medication management  3. The patient will deny suicidal ideations or homicidal ideations for 48 hours prior to  discharge and have a depression and anxiety rating of 3 or less. The patient will also deny any auditory or visual hallucinations or delusional thinking.  4. The patient will deny any symptoms of substance withdrawal at time of discharge.   Plan:  1. Will restart the medication atazanavir 300 mgs po q am for HIV. 2. Will restart the medication emtricitabine-tenofovir 1 tablet po q day for HIV. 3. Will restart the medication ritonavir 100 mgs po q am for HIV. 4. Will restart the medication lurasidone 40 mgs po q am for auditory hallucinations. 5. Will continue the medication naproxen 375 mgs po BID-WC for chronic back pain. 6. Will continue the medication trazodone at 50 mgs po qhs for sleep with a repeat. 7. Will start Celexa at 20 mgs po q am for depression and anxiety. 8. Laboratory Studies reviewed.  9. Will continue to monitor.   SUICIDE RISK:  Minimal: No identifiable suicidal ideation.  Patients presenting with no risk factors but with morbid ruminations; may be classified as minimal risk based on the severity of the depressive symptoms.   Devron Cohick 09/05/2011, 5:40 PM

## 2011-09-05 NOTE — Progress Notes (Signed)
Patient has been calm and cooperative today; he has been attending groups today.  He requests treatment for his substance abuse.  He has an intake assessment at [facility] on 9/4.  He denies any SI/HI/AVH today.  Patient rates his depression as a 5; hopelessness as a 4.  He states he can return to the homeless shelter at [facility] if he can prove that he has been hospitalized.  Continue to monitor medication management and MD orders.  Safety checks continued every 15 minutes.    Encourage patient to attend group and participate in his treatment.

## 2011-09-05 NOTE — Progress Notes (Signed)
D: Patient in room on approach sitting on the side of the bed.  Patient states his day was all right.  Patient states he is learning how to stay positive.  Patient states his depression is low.  Patient denies SI/HI and denies AVH. Patient contracts for safety on the unit.  Patient has been interacting with peers and smiling. Patient has complained of not sleeping well at night. A: Staff to monitor Q 15 mins for safety.  Scheduled medications administered per orders. Encouragement and support offered.   R: Patient remains calm, cooperative and safe on the unit.  Taking administered medications.

## 2011-09-06 NOTE — Progress Notes (Addendum)
D: Patient about on the unit today.  He presents with flat affect/depressed mood.  He said that he slept well last night.  Denies SI/HI.  He stated that his energy level is normal and that his appetite is good per self inventory.  He rates his depression as a 5/10 and feelings of hopelessness as a 2/10.  He denies A/V hallucinations.  He says that he plans to think more positive when he is discharged to better take care of hisself. A:  Offered support and encouragement.  Given meds as prescribed.  R:    Receptive to staff.  Remains on q 15 minute checks for safety.  Will continue to monitor.

## 2011-09-06 NOTE — Care Management (Signed)
Patient does not want long term treatment at this time because he has a disability appointment on 09/21/11 and anticipates having his disability started at this appointment. His disability was approved while he was incarcerated and the judge ordered benefits to be held until he was out of prison. Patient also has two court dates pending in September. He will stay at [facility] until he can make more permanent arrangements for low income housing. Foster Simpson RN MS EdS 09/06/2011  9:43 AM

## 2011-09-06 NOTE — Progress Notes (Signed)
D: Patient cooperative. Patient stated he had a good day.  Patient states the answer is no to all of the questions you are going to ask.  With clarification patient denies SI/HI and denies AVH.  Patient states today he learned more about what schizoaffective disorder is. Patient has been visible in the milieu and interacting with peers. Patient spoke briefly about when he was in prison.  Patient states he has been out for a little over a month and he was in prison for 8 years.  Patient also stated he got into an argument with his wife on the telephone because he has not been the best husband.  Patient states he is going to try to do better.        A: Staff to monitor Q 15 mins for safety.  Support and encouragement offered.  Scheduled medications administered per orders. R: Patient remains safe on the unit. Taking administered medications and patient is calm and cooperative.

## 2011-09-06 NOTE — Progress Notes (Signed)
09/06/2011         Time: 0930      Group Topic/Focus: The focus of this group is on enhancing the patient's understanding of leisure, barriers to leisure, and the importance of engaging in positive leisure activities upon discharge for improved total health.  Participation Level: Minimal  Participation Quality: Attentive  Affect: Appropriate  Cognitive: Alert   Additional Comments: Patient missed half of group as he was with his MD. Patient appeared very appropriate throughout much of group, then began making statements about having his mouth duct-taped shut in court.     Brayen Bunn 09/06/2011 11:49 AM

## 2011-09-06 NOTE — Progress Notes (Signed)
[facility] MD Progress Note  09/06/2011 12:05 PM  S: This is my second day here in this hospital. I am a recovering addict. I was sober x 10 years, then recently relapsed. I started having hallucinations because of hardship. I am homeless, and out of job. I decided to use drugs to feel better. I was seeing shadows, and hearing some voices. The hallucination has gotten better since I started on my medications. My appetite is great. I slept very well last night. My mood is positive now. I will like to go to an outpatient treatment center for recovering addict after my discharge from here. No bad thoughts (SIHI)".   Diagnosis:   Axis I: Schizoaffective disorder, depressive type, Cocaine abuse Axis II: Deferred Axis III:  Past Medical History  Diagnosis Date  . Human immunodeficiency virus (HIV)   . Hepatitis B   . Schizophrenia   . Bipolar 1 disorder    Axis IV: economic problems, housing problems, occupational problems and other psychosocial or environmental problems Axis V: 41-50 serious symptoms  ADL's:  Intact  Sleep: Good  Appetite:  Good  Suicidal Ideation: "No" Plan:  No Intent:  No Means:  no Homicidal Ideation: "No" Plan:  No Intent:  no Means:  no  AEB (as evidenced by): Per patient's reports.  Mental Status Examination/Evaluation: Objective:  Appearance: Casual  Eye Contact::  Good  Speech:  Clear and Coherent  Volume:  Normal  Mood:  "My mood is positive"  Affect:  Appropriate  Thought Process:  Coherent and Intact  Orientation:  Full  Thought Content:  Rumination, mild hallucinations  Suicidal Thoughts:  No  Homicidal Thoughts:  No  Memory:  Immediate;   Good Recent;   Good Remote;   Good  Judgement:  Fair  Insight:  Good  Psychomotor Activity:  Normal  Concentration:  Good  Recall:  Good  Akathisia:  No  Handed:  Right  AIMS (if indicated):     Assets:  Desire for Improvement  Sleep:  Number of Hours: 6.75    Vital Signs:Blood pressure 106/64, pulse  86, temperature 97.3 F (36.3 C), temperature source Oral, resp. rate 17, height 6\' 2"  (1.88 m), weight 91.627 kg (202 lb). Current Medications: Current Facility-Administered Medications  Medication Dose Route Frequency Provider Last Rate Last Dose  . acetaminophen (TYLENOL) tablet 650 mg  650 mg Oral Q6H PRN Laverle Hobby, PA      . alum & mag hydroxide-simeth (MAALOX/MYLANTA) 200-200-20 MG/5ML suspension 30 mL  30 mL Oral Q4H PRN Laverle Hobby, PA      . atazanavir (REYATAZ) capsule 300 mg  300 mg Oral Q breakfast Milana Huntsman Readling, MD   300 mg at 09/06/11 0825  . citalopram (CELEXA) tablet 20 mg  20 mg Oral Q breakfast Milana Huntsman Readling, MD   20 mg at 09/06/11 0825  . emtricitabine-tenofovir (TRUVADA) 200-300 MG per tablet 1 tablet  1 tablet Oral Daily Mellissa Kohut, MD   1 tablet at 09/06/11 0825  . lurasidone (LATUDA) tablet 40 mg  40 mg Oral Q breakfast Milana Huntsman Readling, MD   40 mg at 09/06/11 0825  . magnesium hydroxide (MILK OF MAGNESIA) suspension 30 mL  30 mL Oral Daily PRN Laverle Hobby, PA      . naproxen (NAPROSYN) tablet 375 mg  375 mg Oral BID WC Milana Huntsman Readling, MD   375 mg at 09/06/11 0825  . nicotine (NICODERM CQ - dosed in mg/24 hours) patch 21  mg  21 mg Transdermal Q0600 Milana Huntsman Readling, MD      . ritonavir (NORVIR) capsule 100 mg  100 mg Oral Q breakfast Milana Huntsman Readling, MD   100 mg at 09/06/11 0825  . traZODone (DESYREL) tablet 50 mg  50 mg Oral QHS,MR X 1 Milana Huntsman Readling, MD   50 mg at 09/05/11 2214  . DISCONTD: QUEtiapine (SEROQUEL) tablet 25 mg  25 mg Oral QID PRN Laverle Hobby, PA        Lab Results:  Results for orders placed during the hospital encounter of 09/04/11 (from the past 48 hour(s))  TSH     Status: Normal   Collection Time   09/05/11  6:08 AM      Component Value Range Comment   TSH 2.189  0.350 - 4.500 uIU/mL     Physical Findings: AIMS:  , ,  ,  ,    CIWA:  CIWA-Ar Total: 1  COWS:  COWS Total Score: 1   Treatment Plan  Summary: Daily contact with patient to assess and evaluate symptoms and progress in treatment Medication management  Plan: No changes made on the current treatment regimen. Continue current treatment plan.  Lindell Spar I 09/06/2011, 12:05 PM

## 2011-09-07 MED ORDER — DOCUSATE SODIUM 100 MG PO CAPS
100.0000 mg | ORAL_CAPSULE | ORAL | Status: DC
  Administered 2011-09-08 – 2011-09-14 (×13): 100 mg via ORAL
  Filled 2011-09-07 (×16): qty 1

## 2011-09-07 MED ORDER — LURASIDONE HCL 40 MG PO TABS
20.0000 mg | ORAL_TABLET | ORAL | Status: DC
  Administered 2011-09-08 – 2011-09-14 (×13): 20 mg via ORAL
  Filled 2011-09-07 (×15): qty 1

## 2011-09-07 MED ORDER — DSS 100 MG PO CAPS: 100.0000 mg | ORAL_CAPSULE | ORAL | Status: DC

## 2011-09-07 MED ORDER — TRAZODONE HCL 100 MG PO TABS
100.0000 mg | ORAL_TABLET | Freq: Every day | ORAL | Status: DC
  Administered 2011-09-07 – 2011-09-10 (×4): 100 mg via ORAL
  Filled 2011-09-07 (×6): qty 1

## 2011-09-07 MED ORDER — ALBUTEROL SULFATE HFA 108 (90 BASE) MCG/ACT IN AERS
2.0000 | INHALATION_SPRAY | Freq: Four times a day (QID) | RESPIRATORY_TRACT | Status: DC | PRN
  Administered 2011-09-12: 2 via RESPIRATORY_TRACT
  Filled 2011-09-07: qty 6.7

## 2011-09-07 MED ORDER — MAGNESIUM CITRATE PO SOLN
1.0000 | Freq: Once | ORAL | Status: AC
  Administered 2011-09-07: 1 via ORAL

## 2011-09-07 NOTE — Care Management (Signed)
Patient attended After-care planning group. Group discussed Leisure and Life Style activities as they relate to rediscovering joy, overcoming stress and self-esteem. Patient participated actively in discussion and seemed lucid throughout group. He was able to give and receive feedback in group without incident. Patient still plans to discharge to the [facility] with f/u at [place]. Foster Simpson RN MS EdS 09/07/2011  4:30 PM

## 2011-09-07 NOTE — Progress Notes (Signed)
[facility] MD Progress Note  09/07/2011 12:55 PM  Current Mental Status Per Physician:   Diagnosis:  Axis I: Schizoaffective Disorder - Depressed Type.  Cocaine Abuse.   The patient was seen today and reports the following:   ADL's: Intact.  Sleep: The patient reports to having significant difficulty initiating and maintaining sleep and is concerned about the dosing of his Trazodone.  Appetite: The patient reports that his appetite is improving.   Mild>(1-10) >Severe  Hopelessness (1-10): 0 Depression (1-10): 0  Anxiety (1-10): 3   Suicidal Ideation: The patient denies any suicidal ideations today.  Plan: No  Intent: No  Means: No   Homicidal Ideation: The patient denies any homicidal ideations today.  Plan: No  Intent: No.  Means: No   General Appearance/Behavior: The patient was cooperative today with this provider but with several concerns about his care.  Eye Contact: Good.  Speech: Appropriate in rate and volume with no pressuring of speech noted today.  Motor Behavior: wnl.  Level of Consciousness: Alert and Oriented x 3.  Mental Status: Alert and Oriented x 3.  Mood: Euthymic.  Affect: Mildly Constricted.  Anxiety Level: Mild anxiety reported today.  Thought Process: wnl.  Thought Content: The patient denies any auditory or visual hallucinations or delusional thinking today.  Perception: wnl.  Judgment: Fair to Good.  Insight: Fair to Good.  Cognition: Oriented to person, place and time.  Sleep:  Number of Hours: 6.25    Vital Signs:Blood pressure 126/77, pulse 82, temperature 97.1 F (36.2 C), temperature source Oral, resp. rate 16, height 6\' 2"  (1.88 m), weight 91.627 kg (202 lb).  Current Medications: Current Facility-Administered Medications  Medication Dose Route Frequency Provider Last Rate Last Dose  . acetaminophen (TYLENOL) tablet 650 mg  650 mg Oral Q6H PRN Laverle Hobby, PA      . albuterol (PROVENTIL HFA;VENTOLIN HFA) 108 (90 BASE) MCG/ACT inhaler 2  puff  2 puff Inhalation Q6H PRN Mellissa Kohut, MD      . alum & mag hydroxide-simeth (MAALOX/MYLANTA) 200-200-20 MG/5ML suspension 30 mL  30 mL Oral Q4H PRN Laverle Hobby, PA      . atazanavir (REYATAZ) capsule 300 mg  300 mg Oral Q breakfast Milana Huntsman Jeffry Vogelsang, MD   300 mg at 09/07/11 0815  . citalopram (CELEXA) tablet 20 mg  20 mg Oral Q breakfast Milana Huntsman Vaughn Beaumier, MD   20 mg at 09/07/11 0815  . docusate sodium (COLACE) capsule 100 mg  100 mg Oral BH-qamhs Del Overfelt D Juliannah Ohmann, MD      . emtricitabine-tenofovir (TRUVADA) 200-300 MG per tablet 1 tablet  1 tablet Oral Daily Mellissa Kohut, MD   1 tablet at 09/07/11 0815  . lurasidone (LATUDA) tablet 20 mg  20 mg Oral BH-qamhs Fareeha Evon D Jerome Otter, MD      . magnesium citrate solution 1 Bottle  1 Bottle Oral Once Milana Huntsman Oneal Schoenberger, MD      . magnesium hydroxide (MILK OF MAGNESIA) suspension 30 mL  30 mL Oral Daily PRN Laverle Hobby, PA      . naproxen (NAPROSYN) tablet 375 mg  375 mg Oral BID WC Milana Huntsman Bliss Behnke, MD   375 mg at 09/07/11 0815  . ritonavir (NORVIR) capsule 100 mg  100 mg Oral Q breakfast Milana Huntsman Indea Dearman, MD   100 mg at 09/07/11 0815  . traZODone (DESYREL) tablet 100 mg  100 mg Oral QHS Mellissa Kohut, MD      . DISCONTD:  lurasidone (LATUDA) tablet 40 mg  40 mg Oral Q breakfast Milana Huntsman Keyera Hattabaugh, MD   40 mg at 09/07/11 0815  . DISCONTD: nicotine (NICODERM CQ - dosed in mg/24 hours) patch 21 mg  21 mg Transdermal Q0600 Mellissa Kohut, MD      . DISCONTD: traZODone (DESYREL) tablet 50 mg  50 mg Oral QHS,MR X 1 Milana Huntsman Smt Lokey, MD   50 mg at 09/06/11 2203   Lab Results: No results found for this or any previous visit (from the past 48 hour(s)).  Review of Systems:  Neurological: No headaches, seizures or dizziness reported.  G.I.: The patient reports constipation today but with no stomach upset.  Musculoskeletal: The patient reports chronic back pain from degenerative disk disease which is under good control.   Time was spent  today discussing with the patient his current symptoms. The patient states that he having difficulty initiating and maintaining sleep and starts that he would prefer to have all of his Trazodone dosed at one time rather than with a repeat.  He states that his appetite is improving and he denies any significant feelings of sadness, anhedonia and depressed mood.  He also denies any suicidal or homicidal ideations and denies any auditory or visual hallucinations or delusional thinking.  He states that he is having mild anxiety symptoms and denies any medication related side effects or symptoms or substance withdrawal.  The patient states that he would prefer to have his Latuda split into 20 mgs po q am and hs rather than 40 mgs as one dose.  He would like to have his nighttime Trazodone combined into one dose and he would like something for constipation and as a stool softener.  He also requests an Albuterol Inhaler - prn for shortness of breath.  All of these will be ordered.   Treatment Plan Summary:  1. Daily contact with patient to assess and evaluate symptoms and progress in treatment.  2. Medication management  3. The patient will deny suicidal ideations or homicidal ideations for 48 hours prior to discharge and have a depression and anxiety rating of 3 or less. The patient will also deny any auditory or visual hallucinations or delusional thinking.  4. The patient will deny any symptoms of substance withdrawal at time of discharge.   Plan:  1. Will continue the medication atazanavir at 300 mgs po q am for HIV.  2. Will continue the medication emtricitabine-tenofovir at 1 tablet po q day for HIV.  3. Will continue the medication ritonavir at 100 mgs po q am for HIV.  4. Will continue the medication lurasidone 40 mgs po q am for auditory hallucinations.  5. Will continue the medication naproxen 375 mgs po BID-WC for chronic back pain.  6. Will change the medication Trazodone to100 mgs po qhs.  7.  Will continue the medication Celexa at 20 mgs po q am for depression and anxiety.  8. Will restart Albuterol Inhaler 2 puff q 6 hours - prn for shortness of breath. 9. Will start Colace 100 mgs po q am and hs as a stool softener. 10. Will order Mag Citrate 1 bottle po NOW for constipation. 11. Laboratory Studies reviewed.  12. Will continue to monitor.   Dylan [name] 09/07/2011, 12:55 PM

## 2011-09-07 NOTE — Progress Notes (Signed)
[facility] Group Notes:  (Counselor/Nursing/MHT/Case Management/Adjunct)  09/07/2011 2:15 PM  Type of Therapy:  Music Therapy 1:15  Participation Level:  Minimal  Participation Quality:  Drowsy  Affect:  Irritable  Cognitive:  Oriented  Insight:  Limited  Engagement in Group:  Limited  Engagement in Therapy:  Limited  Modes of Intervention:  Education, Support and Music  Summary of Progress/Problems: Patient was not engaged in group today and even at one point stated that he was not listening. He could not identify ways that he would move forward toward his future. Patient was irritable when counselor pursued identifying coping.    Eula Jaster, Caren Griffins 09/07/2011, 2:15 PM

## 2011-09-07 NOTE — Progress Notes (Signed)
Patient has been calm and cooperative.  He denies any SI/HI/AVH.  He denies any depression today; very little anxiety.  He requested an albuterol inhaler for his asthma.  He attended treatment team meeting and he will be followed up with [facility] and [facility].  He has a room at [facility].  Continue to monitor medication management and MD orders.  Safety checks completed every 15 minutes.  Collaborate with treatment team members regarding patient's POC.  Patient is interacting well with staff and peers.  His behavior is appropriate.  Encourage patient to attend groups and participate in his treatment.

## 2011-09-07 NOTE — Progress Notes (Signed)
[place] Group Notes:  (Counselor/Nursing/MHT/Case Management/Adjunct)  09/07/2011 12:53 PM  Type of Therapy:  Group Therapy  Participation Level:  Active  Participation Quality:  Attentive and Sharing  Affect:  Appropriate  Cognitive:  Oriented  Insight:  Limited  Engagement in Group:  Good  Engagement in Therapy:  Good  Modes of Intervention:  Education, Problem-solving and Support  Summary of Progress/Problems: Patient talked about while he was in prison they told him that he was not mentally ill. Resented the fact that they would not help him for 10 years. Currently, he expressed concern that his medications had been changed without telling him. Encouraged him to address this with the staff.   Dylan [name] 09/07/2011, 12:53 PM

## 2011-09-07 NOTE — Progress Notes (Signed)
[facility] Adult Inpatient Family/Significant Other Suicide Prevention Education  Suicide Prevention Education:  Patient Refusal for Family/Significant Other Suicide Prevention Education: The patient Dylan [name] has refused to provide written consent for family/significant other to be provided Family/Significant Other Suicide Prevention Education during admission and/or prior to discharge.  Physician notified. Patient denied having any support to call.  HartisCaren Griffins 09/07/2011, 12:45 PM

## 2011-09-07 NOTE — Tx Team (Signed)
Interdisciplinary Treatment Plan Update (Adult)  Date: 09/07/2011  Time Reviewed: 1000  Progress in Treatment: Attending groups: Yes Participating in groups:  Yes Taking medication as prescribed: Yes Tolerating medication:  Yes Family/Significant othe contact made:  Patient states that he does not have anyone that can be contacted. Patient understands diagnosis:  Yes Discussing patient identified problems/goals with staff:  Yes Medical problems stabilized or resolved: Started on Celexa 09/07/2011  Denies suicidal/homicidal ideation: Yes Issues/concerns per patient self-inventory:  None identified Other: N/A  New problem(s) identified: "not sleeping good"  Reason for Continuation of Hospitalization: Medication stabilization   Interventions implemented related to continuation of hospitalization: mood stabilization, medication monitoring and adjustment, group therapy and psycho education, safety checks q 15 mins  Additional comments: Started on Celexa today. Rates depression at 0, anxiety 3 and hopeless 0. Patient requested an Albuteral inhaler for SOB. Requested the Latuda be separated in to two doses. Complaints of constipation  Estimated length of stay: 1-2 days  Discharge Plan: SW is assessing for appropriate referrals.   New goal(s): N/A  Review of initial/current patient goals per problem list:   1.  Goal(s): Reduce depressive symptoms  Met:  Yes  As evidenced by: Reducing depression from a 10 to a 0 as reported by pt.   2.  Goal (s): Eliminate Suicidal Ideation  Met:  Yes  As evidenced by: Eliminate suicidal ideation.   3.  Goal(s): Reduce Psychosis  Met:  Yes  As evidenced by: Reduce psychotic symptoms to baseline, as reported by pt.        Attendees: Patient:  Dylan [name] 09/07/2011 1000  Family:     Physician:  Carloyn Jaeger, MD 09/07/2011 1000  Nursing:      Case Manager:  Arna Snipe RN [place] 09/07/2011 1000  Counselor:  Leandrew Koyanagi, MT-BC  09/07/2011 1000  Other:  Fidel Levy RN 09/07/2011 1000  Other:  Mayra Neer 09/07/2011 1000  Other:     Other:      Scribe for Treatment Team:   Arna Snipe 09/07/2011 1000

## 2011-09-07 NOTE — Progress Notes (Signed)
Psychoeducational Group Note  Date:  09/07/2011 Time:  1100  Group Topic/Focus:  Rediscovering Joy:   The focus of this group is to explore various ways to relieve stress in a positive manner.  Participation Level:  Minimal  Participation Quality:  Drowsy and Inattentive  Affect:  Flat  Cognitive:  Oriented  Insight:  None  Engagement in Group:  Limited  Additional Comments:  Pt attended group but appeared to be sleepy as he had a hard time keeping his eyes open. Dylan [name] E 09/07/2011, 12:59 PM

## 2011-09-08 NOTE — Progress Notes (Signed)
[place] Group Notes:  (Counselor/Nursing/MHT/Case Management/Adjunct)  09/08/2011 9:16 PM  Type of Therapy:  Psychoeducational Skills  Participation Level:  Active  Participation Quality:  Appropriate  Affect:  Blunted  Cognitive:  Appropriate  Insight:  Good  Engagement in Group:  Good  Engagement in Therapy:  Good  Modes of Intervention:  Education  Summary of Progress/Problems:The patient verbalized that he had a better day today since he was able to get more rest. His goal for tomorrow is to attend more groups.    Archie Balboa S 09/08/2011, 9:16 PM

## 2011-09-08 NOTE — Progress Notes (Signed)
Pt reports a decrease in appetite and he rates depression as a 3 on 1-10 scale with 10 being the most depressed. Pt denies hallucinations at the present time and reports that he had hallucinations during the night of seeing people that were not in his room. Monitored q 15 minute checks, Encouraged pt to discuss feeling and attend groups. Gave medications as ordered. Pt taking medications as ordered and tolerating well. He denies si and hi. Safety maintained on unit.

## 2011-09-08 NOTE — Progress Notes (Signed)
09/08/2011         Time: 0930      Group Topic/Focus: The focus of the group is on enhancing the patients' ability to utilize positive relaxation strategies by practicing several that can be used at discharge.  Participation Level: Did not attend  Participation Quality: Not Applicable  Affect: Not Applicable  Cognitive: Not Applicable   Additional Comments: Patient refused group.    Croix Presley 09/08/2011 10:21 AM

## 2011-09-08 NOTE — Progress Notes (Signed)
Psychoeducational Group Note  Date:  09/08/2011 Time:  1100  Group Topic/Focus:  Relapse Prevention Planning:   The focus of this group is to define relapse and discuss the need for planning to combat relapse.  Participation Level:  Did Not Attend  Participation Quality:    Affect:    Cognitive:    Insight:    Engagement in Group:    Additional Comments:  Pt was sleeping.  Pricilla Larsson M 09/08/2011, 1:36 PM

## 2011-09-08 NOTE — Progress Notes (Addendum)
Patient ID: Dylan [name], male   DOB: 18-Feb-1965, 46 y.o.   MRN: [id number]  D)  Didn't attend karaoke this evening, stated he had taken a laxative earlier(mag citrate) and didn't want to be too far from his room.Marland Kitchen Has been out on the hall pacing, but cooperative and pleasant, quiet..  Affect is rather flat and sad, interactions are minimal, but makes eye contact.   Came to med window promptly at 9 pm for his meds, didn't want to stay up, stated was ready for bed, compliant. Stated wasn't hearing voices, but that he does see people in his room sometimes. A) will continue to monitor for safety, continue POC.  R) Receptive.

## 2011-09-08 NOTE — Progress Notes (Signed)
Psychoeducational Group Note  Date:  09/08/2011 Time:  2000 Group Topic/Focus:  Karaoke   Participation Level:  Did not attend   Participation Quality:  Did not attend   Affect:  Did not attend   Cognitive:  Did not attend   Insight:  Did not attend   Engagement in Group:  Did not attend   Additional Comments:  Pt didn't attend karaoke this evening.   [name] A 09/08/2011, 1:15 AM

## 2011-09-08 NOTE — Progress Notes (Signed)
[facility] MD Progress Note  09/08/2011 4:32 PM  Current Mental Status Per Physician:  Diagnosis:  Axis I: Schizoaffective Disorder - Depressed Type.  Cocaine Abuse.   The patient was seen today and reports the following:   ADL's: Intact.  Sleep: The patient reports to sleeping "ok" last night.  Appetite: The patient reports that his appetite is improving but remains decreased.   Mild>(1-10) >Severe  Hopelessness (1-10): 0  Depression (1-10): 3  Anxiety (1-10): 0   Suicidal Ideation: The patient denies any suicidal ideations today.  Plan: No  Intent: No  Means: No   Homicidal Ideation: The patient denies any homicidal ideations today.  Plan: No  Intent: No.  Means: No   General Appearance/Behavior: The patient was cooperative today with this provider.  Eye Contact: Good.  Speech: Appropriate in rate and volume with no pressuring of speech noted today.  Motor Behavior: wnl.  Level of Consciousness: Alert and Oriented x 3.  Mental Status: Alert and Oriented x 3.  Mood: Mildly depressed.  Affect: Mildly Constricted.  Anxiety Level: No anxiety reported today.  Thought Process: wnl.  Thought Content: The patient denies any auditory or visual hallucinations or delusional thinking today.  Perception: wnl.  Judgment: Fair to Good.  Insight: Fair to Good.  Cognition: Oriented to person, place and time.  Sleep:  Number of Hours: 6.5    Vital Signs:Blood pressure 124/72, pulse 80, temperature 97.3 F (36.3 C), temperature source Oral, resp. rate 16, height 6\' 2"  (1.88 m), weight 91.627 kg (202 lb).  Current Medications: Current Facility-Administered Medications  Medication Dose Route Frequency Provider Last Rate Last Dose  . acetaminophen (TYLENOL) tablet 650 mg  650 mg Oral Q6H PRN Laverle Hobby, PA      . albuterol (PROVENTIL HFA;VENTOLIN HFA) 108 (90 BASE) MCG/ACT inhaler 2 puff  2 puff Inhalation Q6H PRN Mellissa Kohut, MD      . alum & mag hydroxide-simeth  (MAALOX/MYLANTA) 200-200-20 MG/5ML suspension 30 mL  30 mL Oral Q4H PRN Laverle Hobby, PA      . atazanavir (REYATAZ) capsule 300 mg  300 mg Oral Q breakfast Milana Huntsman Kalisha Keadle, MD   300 mg at 09/08/11 0757  . citalopram (CELEXA) tablet 20 mg  20 mg Oral Q breakfast Milana Huntsman Janeane Cozart, MD   20 mg at 09/08/11 0758  . docusate sodium (COLACE) capsule 100 mg  100 mg Oral BH-qamhs Milana Huntsman Soriya Worster, MD   100 mg at 09/08/11 0758  . emtricitabine-tenofovir (TRUVADA) 200-300 MG per tablet 1 tablet  1 tablet Oral Daily Mellissa Kohut, MD   1 tablet at 09/08/11 0758  . lurasidone (LATUDA) tablet 20 mg  20 mg Oral BH-qamhs Milana Huntsman Richardine Peppers, MD   20 mg at 09/08/11 0759  . magnesium hydroxide (MILK OF MAGNESIA) suspension 30 mL  30 mL Oral Daily PRN Laverle Hobby, PA      . naproxen (NAPROSYN) tablet 375 mg  375 mg Oral BID WC Milana Huntsman Khamia Stambaugh, MD   375 mg at 09/08/11 0758  . ritonavir (NORVIR) capsule 100 mg  100 mg Oral Q breakfast Milana Huntsman Breklyn Fabrizio, MD   100 mg at 09/08/11 0758  . traZODone (DESYREL) tablet 100 mg  100 mg Oral QHS Milana Huntsman Daijha Leggio, MD   100 mg at 09/07/11 2103   Lab Results: No results found for this or any previous visit (from the past 48 hour(s)).  Physical Findings: AIMS: Facial and Oral Movements Muscles of Facial  Expression: None, normal Lips and Perioral Area: None, normal Jaw: None, normal Tongue: None, normal,Extremity Movements Upper (arms, wrists, hands, fingers): None, normal Lower (legs, knees, ankles, toes): None, normal, Trunk Movements Neck, shoulders, hips: None, normal, Overall Severity Severity of abnormal movements (highest score from questions above): None, normal Incapacitation due to abnormal movements: None, normal Patient's awareness of abnormal movements (rate only patient's report): No Awareness, Dental Status Current problems with teeth and/or dentures?: No Does patient usually wear dentures?: No  CIWA:  CIWA-Ar Total: 1  COWS:  COWS Total Score: 1    Review of Systems:  Neurological: No headaches, seizures or dizziness reported.  G.I.: The patient reports constipation today but with no stomach upset.  Musculoskeletal: The patient reports chronic back pain from degenerative disk disease which is under good control.   Time was spent today discussing with the patient his current symptoms. The patient states that he slept "ok" last night and reports his appetite is improving.  He reports mild feelings of sadness, anhedonia and depressed mood but denies any suicidal or homicidal ideations.  He also denies any auditory or visual hallucinations or delusional thinking as well as any anxiety symptoms today.  He denies any medication related side effects or other concerns today.  Treatment Plan Summary:  1. Daily contact with patient to assess and evaluate symptoms and progress in treatment.  2. Medication management  3. The patient will deny suicidal ideations or homicidal ideations for 48 hours prior to discharge and have a depression and anxiety rating of 3 or less. The patient will also deny any auditory or visual hallucinations or delusional thinking.  4. The patient will deny any symptoms of substance withdrawal at time of discharge.   Plan:  1. Will continue the medication atazanavir at 300 mgs po q am for HIV.  2. Will continue the medication emtricitabine-tenofovir at 1 tablet po q day for HIV.  3. Will continue the medication ritonavir at 100 mgs po q am for HIV.  4. Will continue the medication lurasidone 40 mgs po q am for auditory hallucinations.  5. Will continue the medication naproxen 375 mgs po BID-WC for chronic back pain.  6. Will continue the medication Trazodone at 100 mgs po qhs.  7. Will continue the medication Celexa at 20 mgs po q am for depression and anxiety.  8. Laboratory Studies reviewed.  9. Will continue to monitor.   Verlean Allport 09/08/2011, 4:32 PM

## 2011-09-08 NOTE — Progress Notes (Signed)
[facility] Group Notes:  (Counselor/Nursing/MHT/Case Management/Adjunct)  09/08/2011 2:09 PM  Type of Therapy:  Group Therapy  Participation Level:  Did Not Attend    Dylan [name] 09/08/2011, 2:09 PM

## 2011-09-08 NOTE — Care Management (Signed)
Did not attend After-Care planning group this AM. Discharge plan remains f/u with [facility]. He will need a bus pass at discharge and a supply of medications. Patient plans to return to [employer] [employer]. Foster Simpson RN MS EdS 09/08/2011  12:34 PM

## 2011-09-09 DIAGNOSIS — B20 Human immunodeficiency virus [HIV] disease: Secondary | ICD-10-CM

## 2011-09-09 MED ORDER — QUETIAPINE FUMARATE 50 MG PO TABS
50.0000 mg | ORAL_TABLET | Freq: Once | ORAL | Status: AC
  Administered 2011-09-09: 50 mg via ORAL
  Filled 2011-09-09 (×2): qty 1

## 2011-09-09 NOTE — Progress Notes (Signed)
[facility] MD Progress Note                                         09/09/2011    Dylan [name] [birth year]    [id number] Hospital day #5  1. Human immunodeficiency virus (HIV) disease   2. Schizoaffective disorder, depressive type   3. Cocaine abuse    The patient was seen today and reports the following:  Sleep: OK Appetite:  So-so  Mild (1-10) Severe  Depression (1-10): 6 Anxiety (1-10): 7 Hopelessness (1-10): 0   Suicidal Ideation: The patient denies suicidal ideation. Plan: None Intent: None Means: None  Homicidal Ideation: The patient denies homicidal ideation. Plan: None Intent: None Means: None  Eye Contact:  good General Appearance: fairly groomed Behavior:  Cooperative  Motor Behavior: pacing in the hall ways Speech: clear and goal directed  Mental Status:  Orientation x 3 Level of Consciousness:   alert Mood: depressed Affect: flat   Thought Process: linear Thought Content:  + responding to internal stimulation AH Perception: intact  Judgment: impaired Insight: lacking Cognition: at least average  VS: height is 6\' 2"  (1.88 m) and weight is 91.627 kg (202 lb). His oral temperature is 98 F (36.7 C). His blood pressure is 107/60 and his pulse is 88. His respiration is 16.  Current Medication:  . atazanavir  300 mg Oral Q breakfast  . citalopram  20 mg Oral Q breakfast  . docusate sodium  100 mg Oral BH-qamhs  . emtricitabine-tenofovir  1 tablet Oral Daily  . lurasidone  20 mg Oral BH-qamhs  . naproxen  375 mg Oral BID WC  . ritonavir  100 mg Oral Q breakfast  . traZODone  100 mg Oral QHS    Lab results: No results found for this or any previous visit (from the past 48 hour(s)).  Group attendance: 2 ROS:    Constitutional: WDWN Adult in NAD   GI: Negative for N,V,D,C   Neuro: Negative for dizziness, blurred vision, visual changes, headaches   Resp: Negative for wheezing, SOB, cough   Cardio: Negative for CP, diaphoresis, fatigue   MSK:  Negative for joint pain, swelling, DROM, or ambulatory difficulties.  Time was spent with the patient discussing the current symptoms and the response to treatment. He states he is doing "ok" and is requesting his inhaler.  He reports no new symptoms.  Treatment Summary: 1. Admit for crisis management and stabilization. 2. Medication management to reduce current symptoms to base line and improve the     patient's overall level of functioning 3. Treat health problems as indicated. 4. Develop treatment plan to decrease risk of relapse upon discharge and the need for readmission. 5. Psycho-social education regarding relapse prevention and self care. 6. Health care follow up as needed for medical problems. 7. Restart home medications where appropriate.   Treatment Plan:  Plan:  1. Will continue the medication atazanavir at 300 mgs po q am for HIV.  2. Will continue the medication emtricitabine-tenofovir at 1 tablet po q day for HIV.  3. Will continue the medication ritonavir at 100 mgs po q am for HIV.  4. Will continue the medication lurasidone 40 mgs po q am for auditory hallucinations.  5. Will continue the medication naproxen 375 mgs po BID-WC for chronic back pain.  6. Will continue the medication Trazodone at 100 mgs po qhs.  7.  Will continue the medication Celexa at 20 mgs po q am for depression and anxiety.  8. Laboratory Studies reviewed.  9. Will continue to monitor.  Marlane Hatcher. Dylan [name] [facility] 09/09/2011

## 2011-09-09 NOTE — Progress Notes (Signed)
Psychoeducational Group Note  Date:  09/09/2011 Time:  1515  Group Topic/Focus:  Healthy Communication:   The focus of this group is to discuss communication, barriers to communication, as well as healthy ways to communicate with others.  Participation Level:  Did Not Attend  Participation Quality:    Affect:    Cognitive:    Insight:    Engagement in Group:    Additional Comments:  none  Kathlen Brunswick, Amayrani Bennick 09/09/2011, 6:55 PM

## 2011-09-09 NOTE — Progress Notes (Signed)
[place] Group Notes:  (Counselor/Nursing/MHT/Case Management/Adjunct)  09/09/2011 11:25 PM  Type of Therapy:  Psychoeducational Skills  Participation Level:  Minimal  Participation Quality:  Inattentive  Affect:  Blunted  Cognitive:  Appropriate  Insight:  Limited  Engagement in Group:  Limited  Engagement in Therapy:  Limited  Modes of Intervention:  Education  Summary of Progress/Problems: The patient was initially hesitant to respond in group. He admits to becoming agitated earlier in the day regarding playing with the piano. His goal for tomorrow is to attend more groups.    Archie Balboa S 09/09/2011, 11:25 PM

## 2011-09-09 NOTE — Progress Notes (Signed)
D   Pt has been pacing the hall and responding to internal stimuli  He has minimal interaction with others and can get argumentative at times  He responds well to redirection  He attends groups but participates minimally due to having problems focusing for long periods of time A   Verbal support given  Medications administered and effectiveness monitored  Q 15 min checks  Redirection when needed R   Pt safe at present

## 2011-09-09 NOTE — Progress Notes (Signed)
Patient ID: Dylan [name], male   DOB: 1966-01-09, 46 y.o.   MRN: [id number]  Problem: Schizoaffective disorder, Cocaine abuse  D: Pt interacting well with peers in milieu; no inappropriate behaviors noted.  A: Monitor patient Q 15 minutes for safety, encourage staff/peer interaction, administer medications as ordered.  R: Pt compliant with medications, pt bright on approach, friendly with staff/peers. Pt denies SI or plans to harm himself at this time.

## 2011-09-09 NOTE — Progress Notes (Addendum)
Psychoeducational Group Note  Date: 09/09/2011 Time: 1015  Group Topic/Focus:  Identifying Needs:   The focus of this group is to help patients identify their personal needs that have been historically problematic and identify healthy behaviors to address their needs.  Participation Level:  minimal Participation Quality: poor Affect: flat Cognitive poor  Insight:  poor  Engagement in Group: appeared interested but pt had difficulty attending and poor attention span.  Additional Comments:  [place]

## 2011-09-09 NOTE — Progress Notes (Signed)
Writer met with patient and he reports having had a good day. Patient voiced no complaints and is agreeable to taking his scheduled medications. Patient denies si/hi/a/v hall. Patient observed to have minimal interaction with peers and is isolative and guarded. Patient offered support, safety maintained on unit, will continue to monitor.

## 2011-09-10 NOTE — Progress Notes (Signed)
Psychoeducational Group Note  Date:  09/10/2011 Time:  0945 am  Group Topic/Focus:  Making Healthy Choices:   The focus of this group is to help patients identify negative/unhealthy choices they were using prior to admission and identify positive/healthier coping strategies to replace them upon discharge.  Participation Level:  Did Not Attend   Migdalia Dk 09/10/2011, 10:29 AM

## 2011-09-10 NOTE — Progress Notes (Signed)
D   Pt is less irritable and more approachable today  Discussed discharge plans with pt and he said he would be leaving Tuesday  After the holidays   He walks the halls and is more engaging in conversation   Pt does report feeling better than when he first came into the hospital A   Verbal support given  Medications administered and effectiveness monitored   Q 15 min check  R   Pt safe at present time

## 2011-09-10 NOTE — Progress Notes (Signed)
  Dylan [name] is a 46 y.o. male [id number] 01/07/1966  09/04/2011 Principal Problem:  *Schizoaffective disorder, depressive type Active Problems:  Cocaine abuse   Mental Status: Alert mood is good denies SI/HI/AVH.   Subjective/Objective:  Slept well walking the hallways. Denies a need for med change or adjustment.   Filed Vitals:   09/10/11 0759  BP: 114/72  Pulse: 76  Temp:   Resp:     Lab Results:   BMET    Component Value Date/Time   NA 140 09/01/2011 0720   K 4.0 09/01/2011 0720   CL 104 09/01/2011 0720   CO2 26 09/01/2011 0720   GLUCOSE 89 09/01/2011 0720   BUN 21 09/01/2011 0720   CREATININE 1.14 09/01/2011 0720   CREATININE 0.98 08/18/2011 1155   CALCIUM 9.2 09/01/2011 0720   GFRNONAA 76* 09/01/2011 0720   GFRAA 88* 09/01/2011 0720    Medications:  Scheduled:     . atazanavir  300 mg Oral Q breakfast  . citalopram  20 mg Oral Q breakfast  . docusate sodium  100 mg Oral BH-qamhs  . emtricitabine-tenofovir  1 tablet Oral Daily  . lurasidone  20 mg Oral BH-qamhs  . naproxen  375 mg Oral BID WC  . QUEtiapine  50 mg Oral Once  . ritonavir  100 mg Oral Q breakfast  . traZODone  100 mg Oral QHS     PRN Meds acetaminophen, albuterol, alum & mag hydroxide-simeth, magnesium hydroxide   Plan: continue current plan of care.   Azaylah Stailey,MICKIE D. 09/10/2011

## 2011-09-10 NOTE — Progress Notes (Signed)
Patient came to medication window and spoke with Probation officer. Patient reports having had a good day. Patient reported that last night he was hearing voices and had trouble sleeping but has not had that problem with voices today. Patient was pleasant and cheerful, currently denies si/hi/a/v hall. Patient informed to let writer know if he had that problem again tonight and Probation officer would contact doctor on call for an order. Support offered, safety maintained on unit, will continue to monitor.

## 2011-09-10 NOTE — Progress Notes (Signed)
Patient ID: Dylan [name], male   DOB: 1965/03/01, 46 y.o.   MRN: [id number]  [facility] Group Notes:  (Counselor/Nursing/MHT/Case Management/Adjunct)  09/10/2011 11 AM  Type of Therapy:  Aftercare Planning, Group Therapy, Dance/Movement Therapy   Participation Level:  Minimal  Participation Quality:  Drowsy  Affect:  Flat  Cognitive:  Confused  Insight:  Limited  Engagement in Group:  Limited  Engagement in Therapy:  Limited  Modes of Intervention:  Clarification, Problem-solving, Role-play, Socialization and Support  Summary of Progress/Problems: After Care: Pt. attended and participated in aftercare planning group. Pt. accepted information on suicide prevention, warning signs to look for with suicide and crisis line numbers to use. The pt. agreed to call crisis line numbers if having warning signs or having thoughts of suicide. Pt. listed their current anxiety level as a "seven."   Counseling:  Pt. Left during group then returned.  Pt. Did did not actively participate in group and did not maintain eye contact with counselor.  Pt. dd not take weekly handout but did accept information on suicide prevention and warning signs. Pt spoke about Beverly Sessions being a support that he uses in his life  [facility] 09/10/2011. 11:54 AM

## 2011-09-11 MED ORDER — TRAZODONE HCL 100 MG PO TABS
150.0000 mg | ORAL_TABLET | Freq: Every day | ORAL | Status: DC
  Administered 2011-09-11 – 2011-09-13 (×3): 150 mg via ORAL
  Filled 2011-09-11 (×4): qty 1

## 2011-09-11 MED ORDER — BENZTROPINE MESYLATE 0.5 MG PO TABS
0.5000 mg | ORAL_TABLET | ORAL | Status: DC
  Administered 2011-09-11 – 2011-09-14 (×6): 0.5 mg via ORAL
  Filled 2011-09-11 (×8): qty 1

## 2011-09-11 MED ORDER — CITALOPRAM HYDROBROMIDE 40 MG PO TABS
40.0000 mg | ORAL_TABLET | Freq: Every day | ORAL | Status: DC
  Administered 2011-09-12 – 2011-09-13 (×2): 40 mg via ORAL
  Filled 2011-09-11 (×4): qty 1

## 2011-09-11 NOTE — Progress Notes (Signed)
Psychoeducational Group Note  Date:  09/11/2011 Time:  1100  Group Topic/Focus:  Self Care:   The focus of this group is to help patients understand the importance of self-care in order to improve or restore emotional, physical, spiritual, interpersonal, and financial health.  Participation Level:  Active  Participation Quality:  Appropriate  Affect:  Appropriate  Cognitive:  Appropriate  Insight:  Good  Engagement in Group:  Good  Additional Comments:  Pt participated and processed in group.  Kathlen Brunswick, Jacqulynn Shappell 09/11/2011, 1:07 PM

## 2011-09-11 NOTE — Progress Notes (Signed)
D   Pt has been walking the hallways and talking to a certain male pt   He is pleasant on approach and cooperative with treatment  He denies suicidal and homicidal ideation and auditory and visual hallucinations A   Verbal support given   Medications administered and effectiveness monitored  Q 15 min checks R   Pt safe at present

## 2011-09-11 NOTE — Progress Notes (Signed)
Psychoeducational Group Note  Date:  09/11/2011 Time:  2000  Group Topic/Focus:  Wrap-Up Group:   The focus of this group is to help patients review their daily goal of treatment and discuss progress on daily workbooks.  Participation Level:  Minimal  Participation Quality:  Appropriate  Affect:  Appropriate  Cognitive:  Appropriate  Insight:  Good  Engagement in Group:  Good  Additional Comments:  Patient attended and participated in group tonight. He reported having a good day today. He walked, attended groups and socialized with peers. Patient advised that he takes care of his wellness by socializing, walking and sometimes eat well.  Salley Scarlet [facility] 09/11/2011, 10:33 PM

## 2011-09-11 NOTE — Progress Notes (Signed)
[place] Group Notes:  (Counselor/Nursing/MHT/Case Management/Adjunct)  09/11/2011 12:27 AM  Type of Therapy:  Psychoeducational Skills  Participation Level:  Active  Participation Quality:  Attentive  Affect:  Appropriate  Cognitive:  Appropriate  Insight:  Good  Engagement in Group:  Good  Engagement in Therapy:  Good  Modes of Intervention:  Education  Summary of Progress/Problems: We discussed this evening what the patients learned today. The pt. Verbalized that he learned the importance of listening. His goal for tomorrow  Is to attend more groups and to observe others.    Dylan [name] S 09/11/2011, 12:27 AM

## 2011-09-11 NOTE — Progress Notes (Signed)
Patient ID: Dylan [name], male   DOB: 05-01-1965, 46 y.o.   MRN: [id number] D- Patient reports he slept ffair and his appetite is improving.  His energy level is low and his ability to pay attention is improving.  His depression is a 7 and he denies hearing voices or having thoughts of self harm.  A- supported patient. R- he is expressing concern about how he will get his medications.

## 2011-09-11 NOTE — Progress Notes (Signed)
[facility] MD Progress Note  09/11/2011 2:37 PM  Current Mental Status Per Physician:  Diagnosis:  Axis I: Schizoaffective Disorder - Depressed Type.  Cocaine Abuse.   The patient was seen today and reports the following:   ADL's: Intact.  Sleep: The patient reports to "sleeping on and off" last night. Appetite: The patient reports that his appetite is improving but remains decreased.   Mild>(1-10) >Severe  Hopelessness (1-10): 0  Depression (1-10): 7  Anxiety (1-10): 0   Suicidal Ideation: The patient denies any suicidal ideations today.  Plan: No  Intent: No  Means: No   Homicidal Ideation: The patient denies any homicidal ideations today.  Plan: No  Intent: No.  Means: No   General Appearance/Behavior: The patient was cooperative today with this provider.  Eye Contact: Good.  Speech: Appropriate in rate and volume with no pressuring of speech noted today.  Motor Behavior: wnl.  Level of Consciousness: Alert and Oriented x 3.  Mental Status: Alert and Oriented x 3.  Mood: Moderately depressed.  Affect: Moderately Constricted.  Anxiety Level: No anxiety reported today.  Thought Process: wnl.  Thought Content: The patient denies any auditory or visual hallucinations or delusional thinking today.  Perception: wnl.  Judgment: Fair to Good.  Insight: Fair to Good.  Cognition: Oriented to person, place and time.  Sleep:  Number of Hours: 6.5    Vital Signs:Blood pressure 136/67, pulse 89, temperature 97.8 F (36.6 C), temperature source Oral, resp. rate 16, height 6\' 2"  (1.88 m), weight 91.627 kg (202 lb).  Current Medications: Current Facility-Administered Medications  Medication Dose Route Frequency Provider Last Rate Last Dose  . acetaminophen (TYLENOL) tablet 650 mg  650 mg Oral Q6H PRN Laverle Hobby, PA      . albuterol (PROVENTIL HFA;VENTOLIN HFA) 108 (90 BASE) MCG/ACT inhaler 2 puff  2 puff Inhalation Q6H PRN Mellissa Kohut, MD      . alum & mag hydroxide-simeth  (MAALOX/MYLANTA) 200-200-20 MG/5ML suspension 30 mL  30 mL Oral Q4H PRN Laverle Hobby, PA      . atazanavir (REYATAZ) capsule 300 mg  300 mg Oral Q breakfast Milana Huntsman Amelie Caracci, MD   300 mg at 09/11/11 0806  . benztropine (COGENTIN) tablet 0.5 mg  0.5 mg Oral BH-qamhs Alesandra Smart D Manasvi Dickard, MD      . citalopram (CELEXA) tablet 40 mg  40 mg Oral Q breakfast Milana Huntsman Tajh Livsey, MD      . docusate sodium (COLACE) capsule 100 mg  100 mg Oral BH-qamhs Milana Huntsman Fumi Guadron, MD   100 mg at 09/11/11 0807  . emtricitabine-tenofovir (TRUVADA) 200-300 MG per tablet 1 tablet  1 tablet Oral Daily Mellissa Kohut, MD   1 tablet at 09/11/11 0806  . lurasidone (LATUDA) tablet 20 mg  20 mg Oral BH-qamhs Milana Huntsman Rachell Druckenmiller, MD   20 mg at 09/11/11 0807  . magnesium hydroxide (MILK OF MAGNESIA) suspension 30 mL  30 mL Oral Daily PRN Laverle Hobby, PA      . naproxen (NAPROSYN) tablet 375 mg  375 mg Oral BID WC Milana Huntsman Hayes Czaja, MD   375 mg at 09/11/11 0807  . ritonavir (NORVIR) capsule 100 mg  100 mg Oral Q breakfast Milana Huntsman Eldoris Beiser, MD   100 mg at 09/11/11 0806  . traZODone (DESYREL) tablet 150 mg  150 mg Oral QHS Milana Huntsman Melana Hingle, MD      . DISCONTD: citalopram (CELEXA) tablet 20 mg  20 mg Oral Q breakfast  Milana Huntsman Gerhardt Gleed, MD   20 mg at 09/11/11 0807  . DISCONTD: traZODone (DESYREL) tablet 100 mg  100 mg Oral QHS Milana Huntsman Havanna Groner, MD   100 mg at 09/10/11 2114   Lab Results: No results found for this or any previous visit (from the past 48 hour(s)).  Physical Findings: AIMS: Facial and Oral Movements Muscles of Facial Expression: None, normal Lips and Perioral Area: None, normal Jaw: None, normal Tongue: None, normal,Extremity Movements Upper (arms, wrists, hands, fingers): None, normal Lower (legs, knees, ankles, toes): None, normal, Trunk Movements Neck, shoulders, hips: None, normal, Overall Severity Severity of abnormal movements (highest score from questions above): None, normal Incapacitation due to abnormal  movements: None, normal Patient's awareness of abnormal movements (rate only patient's report): No Awareness, Dental Status Current problems with teeth and/or dentures?: No Does patient usually wear dentures?: No  CIWA:  CIWA-Ar Total: 1  COWS:  COWS Total Score: 1   Review of Systems:  Neurological: No headaches, seizures or dizziness reported.  G.I.: The patient reports constipation today but with no stomach upset.  Musculoskeletal: The patient reports chronic back pain from degenerative disk disease which is under good control.   Time was spent today discussing with the patient his current symptoms. The patient states that he slept "on and off" last night and reports his appetite is improving. He reports moderate feelings of sadness, anhedonia and depressed mood but denies any suicidal or homicidal ideations. He also denies any auditory or visual hallucinations or delusional thinking as well as any anxiety symptoms today. He denies any anxiety symptoms and denies any medication related side effects today. The patient does state that he feels "antsy" today but is unsure if this is related to medication side effects.  Treatment Plan Summary:  1. Daily contact with patient to assess and evaluate symptoms and progress in treatment.  2. Medication management  3. The patient will deny suicidal ideations or homicidal ideations for 48 hours prior to discharge and have a depression and anxiety rating of 3 or less. The patient will also deny any auditory or visual hallucinations or delusional thinking.  4. The patient will deny any symptoms of substance withdrawal at time of discharge.   Plan:  1. Will continue the medication atazanavir at 300 mgs po q am for HIV.  2. Will continue the medication emtricitabine-tenofovir at 1 tablet po q day for HIV.  3. Will continue the medication ritonavir at 100 mgs po q am for HIV.  4. Will continue the medication lurasidone 40 mgs po q am for auditory  hallucinations.  5. Will continue the medication naproxen 375 mgs po BID-WC for chronic back pain.  6. Will increase the medication Trazodone to 150 mgs po qhs for sleep.  7. Will increase the medication Celexa to 40 mgs po q am for depression and anxiety.  8. Will start the medication Cogentin at 0.5 mgs po q am and hs for possible EPS. 9. Laboratory Studies reviewed.  10. Will continue to monitor.   Nautica Hotz 09/11/2011, 2:37 PM

## 2011-09-12 NOTE — Progress Notes (Signed)
Patient resting quietly with eyes closed. Respirations even and unlabored, no distress noted. Q15 minute check continues to maintain safety.

## 2011-09-12 NOTE — Tx Team (Signed)
Interdisciplinary Treatment Plan Update (Adult) Date: 09/12/2011  Time Reviewed: 9:42 AM  Progress in Treatment: Attending groups: Yes Participating in groups: Yes Taking medication as prescribed: Yes Tolerating medication: Yes Family/Significant othe contact made: Counselor to assess for appropriate contact. Patient understands diagnosis: Yes Discussing patient identified problems/goals with staff: Yes Medical problems stabilized or resolved: Yes Denies suicidal/homicidal ideation: Yes Issues/concerns per patient self-inventory: None identified Other: N/A New problem(s) identified: None Identified Reason for Continuation of Hospitalization: Anxiety Depression Hallucinations Interventions implemented related to continuation of hospitalization: mood stabilization, medication monitoring and adjustment, group therapy and psycho education, safety checks q 15 mins Additional comments: N/A Estimated length of stay:  Discharge Plan: SW is assessing for appropriate referrals.  New goal(s): N/A Review of initial/current patient goals per problem list:  1. Goal(s): Reduce depressive symptoms  Met: No  Target date: by discharge  As evidenced by: Reducing depression from a 10 to a 3 as reported by pt.  2. Goal (s): Eliminate Suicidal Ideation  Met: No  Target date: by discharge  As evidenced by: Eliminate suicidal ideation.  3. Goal(s): Reduce Psychosis  Met: No  Target date: by discharge  As evidenced by: Reduce psychotic symptoms to baseline, as reported by pt.   Attendees: Patient: Dylan [name]   Family:    Physician: Carloyn Jaeger, MD 09/12/2011 9:42 AM   Nursing:    Case Manager: Ruthe Mannan, [place] 09/12/2011 9:42 AM   Counselor: Leandrew Koyanagi, MT-BC 09/12/2011 9:42 AM   Other: Mayra Neer, RN 09/12/2011 9:42 AM   Other:    Other:    Other:    Scribe for Treatment Team:  Ruthe Mannan, [place] 09/12/2011 9:42 AM

## 2011-09-12 NOTE — Progress Notes (Signed)
D:  Behavior appropriate.  Mood depressed and sad. A:  Medications given per MD order.   Support and encouragement given throughout the day.  Safety checks continue. R:  Denied SI and HI.  Denied A/V hallucinations.  Contracts for safety.   Patient remains safe and receptive on unit.

## 2011-09-12 NOTE — Discharge Planning (Signed)
09/12/2011  SW met with patient in discharge planning group.  SW found pt. Is currently staying in [facility], and they are reportedly holding pt bed.  Pt. To go to [facility] for treatment.  Pt. May have pending legal charges. SW to continue to monitor and assess for more information.    Ruthe Mannan, LCASA 09/12/2011, 5:57 PM

## 2011-09-12 NOTE — Progress Notes (Signed)
Patient ID: Dylan [name], male   DOB: Jan 19, 1965, 46 y.o.   MRN: [id number]  Pt asleep; no s/s of distress noted.

## 2011-09-12 NOTE — Progress Notes (Signed)
[facility] MD Progress Note  09/12/2011 12:00 PM  S: "I am doing fine. My mood is getting better. I slept better last night. I think the reason was because I stayed up longer during the day. I still hear voices, a lot of chattering going on. I could not make out what they are saying. The voices are more during the day sometimes. I am learning to try to block block the voices".  Diagnosis:   Axis I: Schizoaffective disorder, depressive type Axis II: Deferred Axis III:  Past Medical History  Diagnosis Date  . Human immunodeficiency virus (HIV)   . Hepatitis B   . Schizophrenia   . Bipolar 1 disorder    Axis IV: other psychosocial or environmental problems Axis V: 41-50 serious symptoms  ADL's:  Intact  Sleep: Good  Appetite:  Good  Suicidal Ideation: "No" Plan:  No Intent:  no Means:  no Homicidal Ideation:  Plan:  No Intent:  No Means:  No  AEB (as evidenced by): Per patient's reports.  Mental Status Examination/Evaluation: Objective:  Appearance: Casual  Eye Contact::  Good  Speech:  Clear and Coherent  Volume:  Normal  Mood:  "My mood is getting better"  Affect:  Appropriate  Thought Process:  Coherent  Orientation:  Full  Thought Content:  Hallucinations: Auditory  Suicidal Thoughts:  No  Homicidal Thoughts:  No  Memory:  Immediate;   Good Recent;   Good Remote;   Good  Judgement:  Fair  Insight:  Fair  Psychomotor Activity:  Normal  Concentration:  Good  Recall:  Good  Akathisia:  No  Handed:  Right  AIMS (if indicated):     Assets:  Desire for Improvement  Sleep:  Number of Hours: 6.25    Vital Signs:Blood pressure 112/72, pulse 82, temperature 97.7 F (36.5 C), temperature source Oral, resp. rate 19, height 6\' 2"  (1.88 m), weight 91.627 kg (202 lb). Current Medications: Current Facility-Administered Medications  Medication Dose Route Frequency Provider Last Rate Last Dose  . acetaminophen (TYLENOL) tablet 650 mg  650 mg Oral Q6H PRN Laverle Hobby,  PA      . albuterol (PROVENTIL HFA;VENTOLIN HFA) 108 (90 BASE) MCG/ACT inhaler 2 puff  2 puff Inhalation Q6H PRN Mellissa Kohut, MD   2 puff at 09/12/11 0743  . alum & mag hydroxide-simeth (MAALOX/MYLANTA) 200-200-20 MG/5ML suspension 30 mL  30 mL Oral Q4H PRN Laverle Hobby, PA   30 mL at 09/11/11 2016  . atazanavir (REYATAZ) capsule 300 mg  300 mg Oral Q breakfast Milana Huntsman Readling, MD   300 mg at 09/12/11 0741  . benztropine (COGENTIN) tablet 0.5 mg  0.5 mg Oral BH-qamhs Milana Huntsman Readling, MD   0.5 mg at 09/12/11 0742  . citalopram (CELEXA) tablet 40 mg  40 mg Oral Q breakfast Milana Huntsman Readling, MD   40 mg at 09/12/11 0741  . docusate sodium (COLACE) capsule 100 mg  100 mg Oral BH-qamhs Milana Huntsman Readling, MD   100 mg at 09/12/11 0741  . emtricitabine-tenofovir (TRUVADA) 200-300 MG per tablet 1 tablet  1 tablet Oral Daily Mellissa Kohut, MD   1 tablet at 09/12/11 0742  . lurasidone (LATUDA) tablet 20 mg  20 mg Oral BH-qamhs Milana Huntsman Readling, MD   20 mg at 09/12/11 0741  . magnesium hydroxide (MILK OF MAGNESIA) suspension 30 mL  30 mL Oral Daily PRN Laverle Hobby, PA      . naproxen (NAPROSYN) tablet 375  mg  375 mg Oral BID WC Milana Huntsman Readling, MD   375 mg at 09/12/11 0741  . ritonavir (NORVIR) capsule 100 mg  100 mg Oral Q breakfast Milana Huntsman Readling, MD   100 mg at 09/12/11 0741  . traZODone (DESYREL) tablet 150 mg  150 mg Oral QHS Milana Huntsman Readling, MD   150 mg at 09/11/11 2205  . DISCONTD: citalopram (CELEXA) tablet 20 mg  20 mg Oral Q breakfast Milana Huntsman Readling, MD   20 mg at 09/11/11 0807  . DISCONTD: traZODone (DESYREL) tablet 100 mg  100 mg Oral QHS Milana Huntsman Readling, MD   100 mg at 09/10/11 2114    Lab Results: No results found for this or any previous visit (from the past 48 hour(s)).  Physical Findings: AIMS: Facial and Oral Movements Muscles of Facial Expression: None, normal Lips and Perioral Area: None, normal Jaw: None, normal Tongue: None, normal,Extremity Movements Upper  (arms, wrists, hands, fingers): None, normal Lower (legs, knees, ankles, toes): None, normal, Trunk Movements Neck, shoulders, hips: None, normal, Overall Severity Severity of abnormal movements (highest score from questions above): None, normal Incapacitation due to abnormal movements: None, normal Patient's awareness of abnormal movements (rate only patient's report): No Awareness, Dental Status Current problems with teeth and/or dentures?: No Does patient usually wear dentures?: No  CIWA:  CIWA-Ar Total: 1  COWS:  COWS Total Score: 1   Treatment Plan Summary: Daily contact with patient to assess and evaluate symptoms and progress in treatment Medication management  Plan: No new changes made on patient's current treatment regimen. Continue current treatment plan.  Lindell Spar I 09/12/2011, 12:00 PM

## 2011-09-12 NOTE — Progress Notes (Signed)
[facility] Group Notes:  (Counselor/Nursing/MHT/Case Management/Adjunct)  09/12/2011 4:58 PM  Type of Therapy:  Group Therapy  Participation Level:  None  Participation Quality:  Patient came into group at the last 10 minutes. He did not want to join discussion about recovery.     HartisCaren Griffins 09/12/2011, 4:58 PM

## 2011-09-12 NOTE — Progress Notes (Signed)
Psychoeducational Group Note  Date:  09/12/2011 Time:  0930  Group Topic/Focus:  Recovery Goals:   The focus of this group is to identify appropriate goals for recovery and establish a plan to achieve them.  Participation Level:  Minimal  Participation Quality:  Attentive  Affect:  Appropriate  Cognitive:  Appropriate  Insight:  Limited  Engagement in Group:  Limited  Additional Comments:  Pt attended group but did not participate much.  Poonam Woehrle E 09/12/2011, 11:43 AM

## 2011-09-12 NOTE — Progress Notes (Signed)
Patient ID: Dylan [name], male   DOB: 03/04/1965, 46 y.o.   MRN: [id number] D-Patient reports fair sleep and improving appetite.  He denies thoughts of self harm.  He also denies hearing voices this am, stating "I hear voices at night".  He did ask to use his inhaler this am, but no SOB noted.  A- Supported patient.  R-  Patient continues to have flat affect He frequently walks in the hall.

## 2011-09-13 MED ORDER — VENLAFAXINE HCL ER 37.5 MG PO CP24
37.5000 mg | ORAL_CAPSULE | Freq: Every day | ORAL | Status: DC
  Administered 2011-09-14: 37.5 mg via ORAL
  Filled 2011-09-13 (×2): qty 1

## 2011-09-13 NOTE — Progress Notes (Signed)
D: Pt about on the unit today.  He states he is doing OK.  "Finally slept a good nights sleep last night".  He denies SI/HI and denies A/V hallucinations.  He rates his depression as a 8/10 and hopelessness as a 6/10.  He reports that these are less after he met with treatment team knowing he can go back to the shelter and have a bed today or tomorrow.  CW to check regarding a bed there on Friday if it will be available.  He paces in the hallway a lot during the day.  Flat affect /Depressed mood A:  Offered support and encouragement.  Medications given as prescribed.  R:  Pt. receptive to staff.  Remains on q 15 minute checks for safety.  Will continue to monitor.

## 2011-09-13 NOTE — Progress Notes (Signed)
The focus of this group is to help patients review their daily goal of treatment and discuss progress on daily workbooks. Pt entered the Group late due to a recent shower, but participated once in Group. Pt reported that today was a good day because he woke up clean and sober.

## 2011-09-13 NOTE — Progress Notes (Signed)
Pt. Did not attend group and has been lying in bed this evening.  Pt. Is  Isolative. Compliant with his medications  But would not come to the medications window when encouraged to do so. Writer  Carried medications to pt. Room.

## 2011-09-13 NOTE — Discharge Planning (Signed)
09/13/2011  SW met with patient in discharge planning group.  Pt inquired about prescriptions at [facility]  SW provided patient with contact numbers for his physician to call and request his prescriptions to be sent over to [facility].  SW contacted [employer] and confirmed patient can return and stay through 10-09-11.  Pt to follow-up with [facility] for aftercare.  SW to continue to monitor and assess for referrals.  Ruthe Mannan, LCASA 09/13/2011, 5:34 PM

## 2011-09-13 NOTE — Progress Notes (Signed)
EKG completed and transmitted. Reading placed on front of chart.

## 2011-09-13 NOTE — Progress Notes (Signed)
D.  Pt. Denies SI/HI and denies A/V hallucinations.  No concerns or issues voiced.  Denies pain.  A.  Encouragement given. R. Pt. Receptive.

## 2011-09-13 NOTE — Progress Notes (Signed)
09/13/2011         Time: 0930      Group Topic/Focus: The focus of this group is on enhancing the patient's understanding of leisure, barriers to leisure, and the importance of engaging in positive leisure activities upon discharge for improved total health.  Participation Level: Active  Participation Quality: Attentive  Affect: Blunted  Cognitive: Alert   Additional Comments: Patient flat, engages in group with encouragement. Patient able to identify positive leisure interests she can engage in upon discharge.   Dylan [name] 09/13/2011 10:13 AM

## 2011-09-13 NOTE — Progress Notes (Signed)
[facility] MD Progress Note  09/13/2011 12:45 PM  Current Mental Status Per Physician:  Diagnosis:  Axis I: Schizoaffective Disorder - Depressed Type.  Cocaine Abuse.   The patient was seen today and reports the following:   ADL's: Intact.  Sleep: The patient reports to sleeping well last night for the first time since admission. Appetite: The patient reports that his appetite is improving but remains decreased.   Mild>(1-10) >Severe  Hopelessness (1-10): 0  Depression (1-10): 7  Anxiety (1-10): 0   Suicidal Ideation: The patient denies any suicidal ideations today.  Plan: No  Intent: No  Means: No   Homicidal Ideation: The patient denies any homicidal ideations today.  Plan: No  Intent: No.  Means: No   General Appearance/Behavior: The patient was friendly and cooperative today with this provider.  Eye Contact: Good.  Speech: Appropriate in rate and volume with no pressuring of speech noted today.  Motor Behavior: wnl.  Level of Consciousness: Alert and Oriented x 3.  Mental Status: Alert and Oriented x 3.  Mood: Moderately depressed.  Affect: Moderately Constricted.  Anxiety Level: No anxiety reported today.  Thought Process: wnl.  Thought Content: The patient denies any auditory or visual hallucinations or delusional thinking today.  Perception: wnl.  Judgment: Fair to Good.  Insight: Fair to Good.  Cognition: Oriented to person, place and time.  Sleep:  Number of Hours: 6.75    Vital Signs:Blood pressure 130/75, pulse 72, temperature 97.6 F (36.4 C), temperature source Oral, resp. rate 22, height 6\' 2"  (1.88 m), weight 91.627 kg (202 lb).  Current Medications: Current Facility-Administered Medications  Medication Dose Route Frequency Provider Last Rate Last Dose  . acetaminophen (TYLENOL) tablet 650 mg  650 mg Oral Q6H PRN Laverle Hobby, PA      . albuterol (PROVENTIL HFA;VENTOLIN HFA) 108 (90 BASE) MCG/ACT inhaler 2 puff  2 puff Inhalation Q6H PRN Mellissa Kohut, MD   2 puff at 09/12/11 0743  . alum & mag hydroxide-simeth (MAALOX/MYLANTA) 200-200-20 MG/5ML suspension 30 mL  30 mL Oral Q4H PRN Laverle Hobby, PA   30 mL at 09/12/11 2002  . atazanavir (REYATAZ) capsule 300 mg  300 mg Oral Q breakfast Milana Huntsman Analei Whinery, MD   300 mg at 09/13/11 0754  . benztropine (COGENTIN) tablet 0.5 mg  0.5 mg Oral BH-qamhs Milana Huntsman Starsky Nanna, MD   0.5 mg at 09/13/11 0754  . citalopram (CELEXA) tablet 40 mg  40 mg Oral Q breakfast Milana Huntsman Ac Colan, MD   40 mg at 09/13/11 0755  . docusate sodium (COLACE) capsule 100 mg  100 mg Oral BH-qamhs Milana Huntsman Namir Neto, MD   100 mg at 09/13/11 0755  . emtricitabine-tenofovir (TRUVADA) 200-300 MG per tablet 1 tablet  1 tablet Oral Daily Mellissa Kohut, MD   1 tablet at 09/13/11 0754  . lurasidone (LATUDA) tablet 20 mg  20 mg Oral BH-qamhs Milana Huntsman Sharifa Bucholz, MD   20 mg at 09/13/11 0754  . magnesium hydroxide (MILK OF MAGNESIA) suspension 30 mL  30 mL Oral Daily PRN Laverle Hobby, PA      . naproxen (NAPROSYN) tablet 375 mg  375 mg Oral BID WC Milana Huntsman Amere Bricco, MD   375 mg at 09/13/11 0754  . ritonavir (NORVIR) capsule 100 mg  100 mg Oral Q breakfast Milana Huntsman Lelani Garnett, MD   100 mg at 09/13/11 0754  . traZODone (DESYREL) tablet 150 mg  150 mg Oral QHS Mellissa Kohut, MD  150 mg at 09/12/11 2123  . venlafaxine XR (EFFEXOR-XR) 24 hr capsule 37.5 mg  37.5 mg Oral Q breakfast Mellissa Kohut, MD       Lab Results: No results found for this or any previous visit (from the past 48 hour(s)).  Physical Findings: AIMS: Facial and Oral Movements Muscles of Facial Expression: None, normal Lips and Perioral Area: None, normal Jaw: None, normal Tongue: None, normal,Extremity Movements Upper (arms, wrists, hands, fingers): None, normal Lower (legs, knees, ankles, toes): None, normal, Trunk Movements Neck, shoulders, hips: None, normal, Overall Severity Severity of abnormal movements (highest score from questions above): None,  normal Incapacitation due to abnormal movements: None, normal Patient's awareness of abnormal movements (rate only patient's report): No Awareness, Dental Status Current problems with teeth and/or dentures?: No Does patient usually wear dentures?: No  CIWA:  CIWA-Ar Total: 0  COWS:  COWS Total Score: 0   Review of Systems:  Neurological: No headaches, seizures or dizziness reported.  G.I.: The patient reports constipation today but with no stomach upset.  Musculoskeletal: The patient reports chronic back pain from degenerative disk disease which is under good control.   Time was spent today discussing with the patient his current symptoms. The patient states that he slept well last night for the first time since admission.  He reports his appetite is improving but remains decreased and reports ongoing moderate feelings of sadness, anhedonia and depressed mood.  The patient denies any suicidal or homicidal ideations and also denies any auditory or visual hallucinations or delusional thinking.  The patient also denies any anxiety symptoms today or any medication related side effects.  Discharge planning was discussed and it was agreed that the patient would return to the [facility] either Thursday or Friday of this week.  Treatment Plan Summary:  1. Daily contact with patient to assess and evaluate symptoms and progress in treatment.  2. Medication management  3. The patient will deny suicidal ideations or homicidal ideations for 48 hours prior to discharge and have a depression and anxiety rating of 3 or less. The patient will also deny any auditory or visual hallucinations or delusional thinking.  4. The patient will deny any symptoms of substance withdrawal at time of discharge.   Plan:  1. Will continue the medication atazanavir at 300 mgs po q am for HIV.  2. Will continue the medication emtricitabine-tenofovir at 1 tablet po q day for HIV.  3. Will continue the medication ritonavir  at 100 mgs po q am for HIV.  4. Will continue the medication lurasidone 40 mgs po q am for auditory hallucinations.  5. Will continue the medication naproxen 375 mgs po BID-WC for chronic back pain.  6. Will continue the medication Trazodone to 150 mgs po qhs for sleep.  7. Will continue the medication Celexa to 40 mgs po q am for depression and anxiety.  8. Will continue the medication Cogentin at 0.5 mgs po q am and hs for possible EPS.  9. Will start the medication Effexor XR at 37.5 mgs po q am to also address his depressive symptoms. 10. Laboratory Studies reviewed.  11. Will continue to monitor.   Summer Mccolgan 09/13/2011, 12:45 PM

## 2011-09-13 NOTE — Progress Notes (Signed)
[place] Group Notes:  (Counselor/Nursing/MHT/Case Management/Adjunct)  09/13/2011 1:34 PM  Type of Therapy:  Psychoeducational Skills  Participation Level:  Did Not Attend    Leandrew Koyanagi 09/13/2011, 1:34 PM

## 2011-09-14 MED ORDER — ALBUTEROL SULFATE HFA 108 (90 BASE) MCG/ACT IN AERS: 2.0000 | INHALATION_SPRAY | Freq: Four times a day (QID) | RESPIRATORY_TRACT | Status: DC | PRN

## 2011-09-14 MED ORDER — LURASIDONE HCL 20 MG PO TABS: 20.0000 mg | ORAL_TABLET | ORAL | Status: DC

## 2011-09-14 MED ORDER — RITONAVIR 100 MG PO CAPS: 100.0000 mg | ORAL_CAPSULE | Freq: Every day | ORAL | Status: DC

## 2011-09-14 MED ORDER — CITALOPRAM HYDROBROMIDE 40 MG PO TABS: 40.0000 mg | ORAL_TABLET | Freq: Every day | ORAL | Status: DC

## 2011-09-14 MED ORDER — TRAZODONE HCL 150 MG PO TABS: 150.0000 mg | ORAL_TABLET | Freq: Every day | ORAL | Status: DC

## 2011-09-14 MED ORDER — ATAZANAVIR SULFATE 300 MG PO CAPS: 300.0000 mg | ORAL_CAPSULE | Freq: Every day | ORAL | Status: DC

## 2011-09-14 MED ORDER — DSS 100 MG PO CAPS: 100.0000 mg | ORAL_CAPSULE | ORAL | Status: AC

## 2011-09-14 MED ORDER — BENZTROPINE MESYLATE 0.5 MG PO TABS: 0.5000 mg | ORAL_TABLET | ORAL | Status: DC

## 2011-09-14 MED ORDER — VENLAFAXINE HCL ER 37.5 MG PO CP24: 37.5000 mg | ORAL_CAPSULE | Freq: Every day | ORAL | Status: DC

## 2011-09-14 MED ORDER — EMTRICITABINE-TENOFOVIR DF 200-300 MG PO TABS: 1.0000 | ORAL_TABLET | Freq: Every day | ORAL | Status: DC

## 2011-09-14 MED ORDER — NAPROXEN 375 MG PO TABS: 375.0000 mg | ORAL_TABLET | Freq: Two times a day (BID) | ORAL | Status: DC

## 2011-09-14 NOTE — Progress Notes (Signed)
[facility] Group Notes:  (Counselor/Nursing/MHT/Case Management/Adjunct)  09/14/2011 12:29 PM  Type of Therapy:  Group Therapy  Participation Level:  Active  Participation Quality:  Attentive and Sharing  Affect:  Blunted  Cognitive:  Oriented  Insight:  Limited  Engagement in Group:  Good  Engagement in Therapy:  Good  Modes of Intervention:  Education, Problem-solving, Support and Exploration  Summary of Progress/Problems: Patient did not initiate interaction but was attentive. When questioned he talked about asking for help but that [facility] treated him as if he was just trying to get out of the rain instead of serious about treatment. He talked about maybe that he could ask for help sooner when he was first getting depressed, instead of waiting too long and then feeling like he was in a deep hole. Feeling encouraged by resources and discharge from the hospital.   Dylan [name] 09/14/2011, 12:29 PM

## 2011-09-14 NOTE — Progress Notes (Signed)
Pt laying in bed resting with eyes closed. Respirations even and unlabored. No distress noted.

## 2011-09-14 NOTE — Progress Notes (Signed)
Psychoeducational Group Note  Date:  09/14/2011 Time:  0930  Group Topic/Focus:  Rediscovering Joy:   The focus of this group is to explore various ways to relieve stress in a positive manner.  Participation Level:  Minimal  Participation Quality:  Appropriate  Affect:  Flat  Cognitive:  Oriented  Insight:  Limited  Engagement in Group:  Limited  Additional Comments:  Pt's participation was very minimal during group this morning.  Ninel Abdella E 09/14/2011, 11:52 AM

## 2011-09-14 NOTE — [facility] Suicide Risk Assessment (Signed)
Suicide Risk Assessment  Discharge Assessment     Demographic Factors:  Male, Low socioeconomic status and Unemployed  Mental Status Per Nursing Assessment::   On Admission:   (denies) At time of Discharge:  Time was spent today discussing with the patient his current symptoms. The patient states that he slept well last night without difficulty. He reports his appetite is good today and he reports some mild residual feelings of sadness, anhedonia and depressed mood. The patient adamantly denies any suicidal or homicidal ideations and also denies any auditory or visual hallucinations or delusional thinking. The patient also denies any anxiety symptoms today or any medication related side effects.   The patient states today that he is requesting discharge and would like a bus pass to transport him to the [facility].  This will be ordered at the patient's request.   Current Mental Status Per Physician:  Diagnosis:  Axis I: Schizoaffective Disorder - Depressed Type.  Cocaine Abuse.   The patient was seen today and reports the following:   ADL's: Intact.  Sleep: The patient reports to sleeping well last night without difficulty.  Appetite: The patient reports that his appetite is good today.   Mild>(1-10) >Severe  Hopelessness (1-10): 0  Depression (1-10): 3  Anxiety (1-10): 0   Suicidal Ideation: The patient adamantly denies any suicidal ideations today.  Plan: No  Intent: No  Means: No   Homicidal Ideation: The patient adamantly denies any homicidal ideations today.  Plan: No  Intent: No.  Means: No   General Appearance/Behavior: The patient remained friendly and cooperative today with this provider.  Eye Contact: Good.  Speech: Appropriate in rate and volume with no pressuring of speech noted today.  Motor Behavior: wnl.  Level of Consciousness: Alert and Oriented x 3.  Mental Status: Alert and Oriented x 3.  Mood: Mildly depressed.  Affect: Full today.  Anxiety  Level: No anxiety reported today.  Thought Process: wnl.  Thought Content: The patient denies any auditory or visual hallucinations or delusional thinking today.  Perception: wnl.  Judgment: Fair to Good.  Insight: Fair to Good.  Cognition: Oriented to person, place and time.   Loss Factors: The patient is homeless.  The patient is unemployed.  Serious Non-psychiatric Medical Issues.  Historical Factors: Long history of mental illness. Substance abuse issues.  Risk Reduction Factors:   Good access to healthcare.  Good knowledge of community services.  Continued Clinical Symptoms:  Alcohol/Substance Abuse/Dependencies More than one psychiatric diagnosis Previous Psychiatric Diagnoses and Treatments Medical Diagnoses and Treatments/Surgeries Schizoaffective Disorder - Depressed Type.  Discharge Diagnoses:   AXIS I:   Schizoaffective Disorder - Depressed Type.    Cocaine Abuse.  AXIS II:   Deferred. AXIS III:   1. Hepatitis B Antibody Positive.   2. HIV Positive. AXIS IV:   The patient is homeless.  The patient is unemployed.  Serious Non-psychiatric Medical Issues.  Chronic Mental Health Issues.  Substance Abuse Issues.  AXIS V:   GAF at time of admission approximately 35.  GAF at time of discharge approximately 55.  Cognitive Features That Contribute To Risk:  None Noted.    Current Medications:  Current Facility-Administered Medications   Medication  Dose  Route  Frequency  Provider  Last Rate  Last Dose   .  acetaminophen (TYLENOL) tablet 650 mg  650 mg  Oral  Q6H PRN  Laverle Hobby, PA     .  albuterol (PROVENTIL HFA;VENTOLIN HFA) 108 (90 BASE) MCG/ACT  inhaler 2 puff  2 puff  Inhalation  Q6H PRN  Mellissa Kohut, MD   2 puff at 09/12/11 0743   .  alum & mag hydroxide-simeth (MAALOX/MYLANTA) 200-200-20 MG/5ML suspension 30 mL  30 mL  Oral  Q4H PRN  Laverle Hobby, PA   30 mL at 09/12/11 2002   .  atazanavir (REYATAZ) capsule 300 mg  300 mg  Oral  Q breakfast  Milana Huntsman  Aliani Caccavale, MD   300 mg at 09/13/11 0754   .  benztropine (COGENTIN) tablet 0.5 mg  0.5 mg  Oral  BH-[facility]hs  Milana Huntsman Lee-Ann Gal, MD   0.5 mg at 09/13/11 0754   .  citalopram (CELEXA) tablet 40 mg  40 mg  Oral  Q breakfast  Milana Huntsman Jihaad Bruschi, MD   40 mg at 09/13/11 0755   .  docusate sodium (COLACE) capsule 100 mg  100 mg  Oral  BH-qamhs  Milana Huntsman Nylia Gavina, MD   100 mg at 09/13/11 0755   .  emtricitabine-tenofovir (TRUVADA) 200-300 MG per tablet 1 tablet  1 tablet  Oral  Daily  Mellissa Kohut, MD   1 tablet at 09/13/11 0754   .  lurasidone (LATUDA) tablet 20 mg  20 mg  Oral  BH-qamhs  Milana Huntsman Mccoy Testa, MD   20 mg at 09/13/11 0754   .  magnesium hydroxide (MILK OF MAGNESIA) suspension 30 mL  30 mL  Oral  Daily PRN  Laverle Hobby, PA     .  naproxen (NAPROSYN) tablet 375 mg  375 mg  Oral  BID WC  Milana Huntsman Starlin Steib, MD   375 mg at 09/13/11 0754   .  ritonavir (NORVIR) capsule 100 mg  100 mg  Oral  Q breakfast  Milana Huntsman Maecy Podgurski, MD   100 mg at 09/13/11 0754   .  traZODone (DESYREL) tablet 150 mg  150 mg  Oral  QHS  Milana Huntsman Gabryel Files, MD   150 mg at 09/12/11 2123   .  venlafaxine XR (EFFEXOR-XR) 24 hr capsule 37.5 mg  37.5 mg  Oral  Q breakfast  Mellissa Kohut, MD      Lab Results: No results found for this or any previous visit (from the past 48 hour(s)).   Physical Findings:  AIMS: Facial and Oral Movements  Muscles of Facial Expression: None, normal  Lips and Perioral Area: None, normal  Jaw: None, normal  Tongue: None, normal,Extremity Movements  Upper (arms, wrists, hands, fingers): None, normal  Lower (legs, knees, ankles, toes): None, normal, Trunk Movements  Neck, shoulders, hips: None, normal, Overall Severity  Severity of abnormal movements (highest score from questions above): None, normal  Incapacitation due to abnormal movements: None, normal  Patient's awareness of abnormal movements (rate only patient's report): No Awareness, Dental Status  Current problems with teeth and/or  dentures?: No  Does patient usually wear dentures?: No  CIWA: CIWA-Ar Total: 0  COWS: COWS Total Score: 0   Review of Systems:  Neurological: No headaches, seizures or dizziness reported.  G.I.: The patient reports constipation today but with no stomach upset.  Musculoskeletal: The patient reports chronic back pain from degenerative disk disease which is under good control.   Time was spent today discussing with the patient his current symptoms. The patient states that he slept well last night without difficulty. He reports his appetite is good today and he reports some mild residual feelings of sadness, anhedonia and depressed mood. The patient adamantly  denies any suicidal or homicidal ideations and also denies any auditory or visual hallucinations or delusional thinking. The patient also denies any anxiety symptoms today or any medication related side effects.   The patient states today that he is requesting discharge and would like a bus pass to transport him to the [facility].  This will be ordered at the patient's request.  Treatment Plan Summary:  1. Daily contact with patient to assess and evaluate symptoms and progress in treatment.  2. Medication management  3. The patient will deny suicidal ideations or homicidal ideations for 48 hours prior to discharge and have a depression and anxiety rating of 3 or less. The patient will also deny any auditory or visual hallucinations or delusional thinking.  4. The patient will deny any symptoms of substance withdrawal at time of discharge.   Plan:  1. Will continue the medication atazanavir at 300 mgs po q am for HIV.  2. Will continue the medication emtricitabine-tenofovir at 1 tablet po q day for HIV.  3. Will continue the medication ritonavir at 100 mgs po q am for HIV.  4. Will continue the medication lurasidone 40 mgs po q am for auditory hallucinations.  5. Will continue the medication naproxen 375 mgs po BID-WC for chronic back  pain.  6. Will continue the medication Trazodone to 150 mgs po qhs for sleep.  7. Will continue the medication Celexa to 40 mgs po q am for depression and anxiety.  8. Will continue the medication Cogentin at 0.5 mgs po q am and hs for possible EPS.  9. Will continue the medication Effexor XR at 37.5 mgs po q am to also address his depressive symptoms.  10. Laboratory Studies reviewed.  11. EKG reviewed. 12. Will continue to monitor.  13. Will discharge the patient today as requested to outpatient follow up.  Suicide Risk:  Minimal: No identifiable suicidal ideation.  Patients presenting with no risk factors but with morbid ruminations; may be classified as minimal risk based on the severity of the depressive symptoms  Plan Of Care/Follow-up recommendations:  Activity:  As tolerated. Diet:  Heart Healthy Diet. Other:  Please take all medications only as directed and keep all scheduled follow up appointments.  Please abstain from any use of alcohol or illicit drugs and return to your local Emergency Department should you have any thoughts of harming yourself or others.  Marcellus Pulliam 09/14/2011, 11:40 AM

## 2011-09-14 NOTE — Tx Team (Signed)
Interdisciplinary Treatment Plan Update (Adult)  Date: 09/14/2011  Time Reviewed: 9:42 AM  Progress in Treatment:  Attending groups: Yes  Participating in groups: Yes  Taking medication as prescribed: Yes  Tolerating medication: Yes  Family/Significant othe contact made: Counselor to assess for appropriate contact.  Patient understands diagnosis: Yes  Discussing patient identified problems/goals with staff: Yes  Medical problems stabilized or resolved: Yes  Denies suicidal/homicidal ideation: Yes  Issues/concerns per patient self-inventory: None identified  Other: N/A  New problem(s) identified: None Identified  Patient discharging today. Interventions implemented related to continuation of hospitalization: mood stabilization, medication monitoring and adjustment, group therapy and psycho education, safety checks q 15 mins  Additional comments: N/A  Estimated length of stay:  Discharge Plan: Pt discharging and returning to [facility].  Pt to follow-up with [facility] [place] at the [facility]  New goal(s): N/A  Review of initial/current patient goals per problem list:  1. Goal(s): Reduce depressive symptoms   Met: Yes   Target date: by discharge   As evidenced by: Reducing depression from a 10 to a 3 as reported by pt.  2. Goal (s): Eliminate Suicidal Ideation   Met: Yes   Target date: by discharge   As evidenced by: Eliminate suicidal ideation.  3. Goal(s): Reduce Psychosis   Met: Yes  Target date: by discharge   As evidenced by: Reduce psychotic symptoms to baseline, as reported by pt. : Attendees: Patient: Dylan [name]   Family:    Physician: Carloyn Jaeger, MD 09/14/2011 4:01 PM   Nursing:    Case Manager: Ruthe Mannan, [place] 09/14/2011 4:01 PM   Counselor: Leandrew Koyanagi, MT-BC 09/14/2011 4:01 PM   Other: Mayra Neer, RN 09/14/2011 4:01 PM   Other:    Other:    Other:    Scribe for Treatment Team:  Ruthe Mannan, [place] 09/14/2011  4:01 PM

## 2011-09-14 NOTE — Progress Notes (Signed)
[facility] Case Management Discharge Plan:  Will you be returning to the same living situation after discharge: Yes,    At discharge, do you have transportation home?:Yes,   via Bus Do you have the ability to pay for your medications:Yes,     Interagency Information:     Release of information consent forms completed and in the chart;  Patient's signature needed at discharge.  Patient to Follow up at:  Follow-up Information    Follow up with [facility].  This appointment is to get set up for counseling. on 09/15/2011. (8 AM; Walk-in clinic 8-12 am and 1-3 pm as needed)    Contact information:   [name] [address] [phone]      Follow up with [facility] - this appointment is for medication management on 09/15/2011. (Walk-in clinic is available Monday through Friday from 8-9 AM. Please bring all of your  discharge paperwork with you to your appointment. This will assist  you with getting your medications.)    Contact information:   [place], [place]      Follow up with [facility] on 10/02/2011. (Appt. with Dr. Graylon Good at [facility].)    Contact information:   [address], [place] [address] [phone] fax [phone]         Patient denies SI/HI:   Yes,       Safety Planning and Suicide Prevention discussed:  Yes,     Barrier to discharge identified:No.  Summary and Recommendations:   Tammy Sours 09/14/2011, 4:04 PM

## 2011-09-14 NOTE — Progress Notes (Signed)
Nursing Discharge Note:  Patient discharged to [facility] per MD order.  Patient to take bus to [facility] where a bed is being held for him today.  He received all his sample medications, prescriptions and belongings.  Discharge instructions were reviewed with patient and he understood same.  He denies SI/HI/AVH. He participated well in here treatment.  He was appreciative of staff's assistance.  Patient left ambulatory.

## 2011-09-15 NOTE — Progress Notes (Signed)
Patient Discharge Instructions:  After Visit Summary (AVS):   Faxed to:  09/15/2011 Access to EMR:  09/15/2011 Psychiatric Admission Assessment Note:   Faxed to:  09/15/2011 Access to EMR:  09/15/2011 Suicide Risk Assessment - Discharge Assessment:   Faxed to:  09/15/2011 Access to EMR:  09/15/2011 Faxed/Sent to the Next Level Care provider:  09/15/2011 Next Level Care Provider Has Access to the EMR, 09/15/2011  Records provided to [facility] - Dr. Graylon Good via [facility]/Epic access And faxed to [facility] @ [phone] And to [facility] of the [facility] @ [phone]  Jola Baptist, 09/15/2011, 5:27 PM

## 2011-10-01 NOTE — Discharge Summary (Signed)
Physician Discharge Summary Note  Patient:  Dylan [name] is an 46 y.o., male MRN:  [id number] DOB:  Nov 22, 1965 Patient phone:  [phone] (home)  Patient address:   [address] [place] [address]   Date of Admission:  09/04/2011 Date of Discharge: 09/14/2011  Discharge Diagnoses: Principal Problem:  *Schizoaffective disorder, depressive type Active Problems:  Cocaine abuse  Axis Diagnosis:  AXIS I: Schizoaffective Disorder - Depressed Type.  Cocaine Abuse.  AXIS II: Deferred.  AXIS III: 1. Hepatitis B Antibody Positive.  2. HIV Positive.  AXIS IV: The patient is homeless. The patient is unemployed. Serious Non-psychiatric Medical Issues. Chronic Mental Health Issues. Substance Abuse Issues.  AXIS V: GAF at time of admission approximately 35. GAF at time of discharge approximately 55.   Level of Care:  Inpatient Hospitalization.  Reason for Admission: Time was spent today discussing with the patient his current symptoms. The patient states that he having difficulty initiating and maintaining sleep and reports a good appetite. He reports moderate feelings of sadness, anhedonia and depressed mood and denies current suicidal or homicidal ideations. He states he was experiencing suicidal ideations prior to admission but states these have resolved since admission. He denies any auditory or visual hallucinations or delusional thinking but states he was experiencing auditory hallucinations in the past. He reports mild anxiety symptoms today.  The patient reports that he was recently released from prison after serving 10 years for 1st Degree Kidnapping. He states he is homeless and needs assistance with housing. He also states to using cocaine prior to admission and has pending charges for drug paraphilia and panhandling.   Hospital Course:   The patient attended treatment team meeting this am and met with treatment team members. The patient's symptoms, treatment plan and response to  treatment was discussed. The patient endorsed that their symptoms have improved. The patient also stated that they felt stable for discharge.  They reported that from this hospital stay they had learned many coping skills.  In other to maintain their psychiatric stability, they will continue psychiatric care on an outpatient basis. They will follow-up as outlined below.  In addition they were instructed  to take all your medications as prescribed by their mental healthcare provider and to report any adverse effects and or reactions from your medicines to their outpatient provider promptly.  The patient is also instructed and cautioned to not engage in alcohol and or illegal drug use while on prescription medicines.  In the event of worsening symptoms the patient is instructed to call the crisis hotline, 911 and or go to the nearest ED for appropriate evaluation and treatment of symptoms.   Also while a patient in this hospital, the patient received medication management for his psychiatric symptoms. They were ordered and received as outlined below:    Medication List     As of 10/01/2011  9:15 PM    TAKE these medications      Indication    albuterol 108 (90 BASE) MCG/ACT inhaler   Commonly known as: PROVENTIL HFA;VENTOLIN HFA   Inhale 2 puffs into the lungs every 6 (six) hours as needed for wheezing or shortness of breath.       atazanavir 300 MG capsule   Commonly known as: REYATAZ   Take 1 capsule (300 mg total) by mouth daily with breakfast. For viral infection.       benztropine 0.5 MG tablet   Commonly known as: COGENTIN   Take 1 tablet (0.5 mg  total) by mouth 2 (two) times daily in the am and at bedtime.. For possible EPS.       citalopram 40 MG tablet   Commonly known as: CELEXA   Take 1 tablet (40 mg total) by mouth daily with breakfast. For depression.       emtricitabine-tenofovir 200-300 MG per tablet   Commonly known as: TRUVADA   Take 1 tablet by mouth daily. For viral  infection.       Lurasidone HCl 20 MG Tabs   Take 1 tablet (20 mg total) by mouth 2 (two) times daily in the am and at bedtime.. Please take with food for auditory hallucinations.       naproxen 375 MG tablet   Commonly known as: NAPROSYN   Take 1 tablet (375 mg total) by mouth 2 (two) times daily with a meal. For back pain.       ritonavir 100 MG capsule   Commonly known as: NORVIR   Take 1 capsule (100 mg total) by mouth daily with breakfast. For viral infection.       traZODone 150 MG tablet   Commonly known as: DESYREL   Take 1 tablet (150 mg total) by mouth at bedtime. For sleep.       venlafaxine XR 37.5 MG 24 hr capsule   Commonly known as: EFFEXOR-XR   Take 1 capsule (37.5 mg total) by mouth daily with breakfast. To further address his depressive symptoms.        They were also enrolled in group counseling sessions and activities in which they participated actively.       Follow-up Information    Follow up with [facility].  This appointment is to get set up for counseling. on 09/15/2011. (8 AM; Walk-in clinic 8-12 am and 1-3 pm as needed)    Contact information:   [name] [address] [phone]      Follow up with [facility] Sessions - this appointment is for medication management on 09/15/2011. (Walk-in clinic is available Monday through Friday from 8-9 AM. Please bring all of your  discharge paperwork with you to your appointment. This will assist  you with getting your medications.)    Contact information:   [place], [place]      [facility] on 10[facility]02/2011. ([facility]    Contact information:   [place], [place] [address] [phone] fax [phone]         Upon discharge, patient adamantly denies suicidal, homicidal ideations, auditory, visual hallucinations and or delusional thinking. They left [facility] with all personal belongings  via personal transportation in no apparent distress.  Consults:  Please see electronic medical record for details.  Significant Diagnostic Studies:  Please see electronic medical record for details.  Discharge Vitals:   Blood pressure 103/66, pulse 87, temperature 97.5 F (36.4 C), temperature source Oral, resp. rate 21, height 6\' 2"  (1.88 m), weight 91.627 kg (202 lb)..  Mental Status Exam: Demographic Factors:  Male, Low socioeconomic status and Unemployed  Mental Status Per Nursing Assessment::  On Admission: (denies)  At time of Discharge: Time was spent today discussing with the patient his current symptoms. The patient states that he slept well last night without difficulty. He reports his appetite is good today and he reports some mild residual feelings of sadness, anhedonia and depressed mood. The patient adamantly denies any suicidal or homicidal ideations  and also denies any auditory or visual hallucinations or delusional thinking. The patient also denies any anxiety symptoms today or any medication related side effects.  The patient states today that he is requesting discharge and would like a bus pass to transport him to the [employer]. This will be ordered at the patient's request.  Current Mental Status Per Physician:  Diagnosis:  Axis I: Schizoaffective Disorder - Depressed Type.  Cocaine Abuse.  The patient was seen today and reports the following:  ADL's: Intact.  Sleep: The patient reports to sleeping well last night without difficulty.  Appetite: The patient reports that his appetite is good today.  Mild>(1-10) >Severe  Hopelessness (1-10): 0  Depression (1-10): 3  Anxiety (1-10): 0  Suicidal Ideation: The patient adamantly denies any suicidal ideations today.  Plan: No  Intent: No  Means: No  Homicidal Ideation: The patient adamantly denies any homicidal ideations today.  Plan: No  Intent: No.  Means: No  General Appearance/Behavior: The patient remained  friendly and cooperative today with this provider.  Eye Contact: Good.  Speech: Appropriate in rate and volume with no pressuring of speech noted today.  Motor Behavior: wnl.  Level of Consciousness: Alert and Oriented x 3.  Mental Status: Alert and Oriented x 3.  Mood: Mildly depressed.  Affect: Full today.  Anxiety Level: No anxiety reported today.  Thought Process: wnl.  Thought Content: The patient denies any auditory or visual hallucinations or delusional thinking today.  Perception: wnl.  Judgment: Fair to Good.  Insight: Fair to Good.  Cognition: Oriented to person, place and time.  Loss Factors:  The patient is homeless. The patient is unemployed. Serious Non-psychiatric Medical Issues.  Historical Factors:  Long history of mental illness. Substance abuse issues.  Risk Reduction Factors:  Good access to healthcare. Good knowledge of community services.  Continued Clinical Symptoms:  Alcohol/Substance Abuse/Dependencies  More than one psychiatric diagnosis  Previous Psychiatric Diagnoses and Treatments  Medical Diagnoses and Treatments/Surgeries  Schizoaffective Disorder - Depressed Type.  Discharge Diagnoses:  AXIS I: Schizoaffective Disorder - Depressed Type.  Cocaine Abuse.  AXIS II: Deferred.  AXIS III: 1. Hepatitis B Antibody Positive.  2. HIV Positive.  AXIS IV: The patient is homeless. The patient is unemployed. Serious Non-psychiatric Medical Issues. Chronic Mental Health Issues. Substance Abuse Issues.  AXIS V: GAF at time of admission approximately 35. GAF at time of discharge approximately 55.  Cognitive Features That Contribute To Risk:  None Noted.  Current Medications:  Current Facility-Administered Medications   Medication  Dose  Route  Frequency  Provider  Last Rate  Last Dose   .  acetaminophen (TYLENOL) tablet 650 mg  650 mg  Oral  Q6H PRN  Laverle Hobby, PA     .  albuterol (PROVENTIL HFA;VENTOLIN HFA) 108 (90 BASE) MCG/ACT inhaler 2 puff  2 puff   Inhalation  Q6H PRN  Mellissa Kohut, MD   2 puff at 09/12/11 0743   .  alum & mag hydroxide-simeth (MAALOX/MYLANTA) 200-200-20 MG/5ML suspension 30 mL  30 mL  Oral  Q4H PRN  Laverle Hobby, PA   30 mL at 09/12/11 2002   .  atazanavir (REYATAZ) capsule 300 mg  300 mg  Oral  Q breakfast  Milana Huntsman Marlina Cataldi, MD   300 mg at 09/13/11 0754   .  benztropine (COGENTIN) tablet 0.5 mg  0.5 mg  Oral  BH-qamhs  Milana Huntsman Kaylise Blakeley, MD   0.5 mg at 09/13/11 0754   .  citalopram (CELEXA) tablet 40 mg  40 mg  Oral  Q breakfast  Milana Huntsman Arrie Borrelli, MD   40 mg at 09/13/11 0755   .  docusate sodium (COLACE) capsule 100 mg  100 mg  Oral  BH-qamhs  Milana Huntsman Tiare Rohlman, MD   100 mg at 09/13/11 0755   .  emtricitabine-tenofovir (TRUVADA) 200-300 MG per tablet 1 tablet  1 tablet  Oral  Daily  Mellissa Kohut, MD   1 tablet at 09/13/11 0754   .  lurasidone (LATUDA) tablet 20 mg  20 mg  Oral  BH-qamhs  Milana Huntsman Herchel Hopkin, MD   20 mg at 09/13/11 0754   .  magnesium hydroxide (MILK OF MAGNESIA) suspension 30 mL  30 mL  Oral  Daily PRN  Laverle Hobby, PA     .  naproxen (NAPROSYN) tablet 375 mg  375 mg  Oral  BID WC  Milana Huntsman Kenyah Luba, MD   375 mg at 09/13/11 0754   .  ritonavir (NORVIR) capsule 100 mg  100 mg  Oral  Q breakfast  Milana Huntsman Felicie Kocher, MD   100 mg at 09/13/11 0754   .  traZODone (DESYREL) tablet 150 mg  150 mg  Oral  QHS  Milana Huntsman Marinell Igarashi, MD   150 mg at 09/12/11 2123   .  venlafaxine XR (EFFEXOR-XR) 24 hr capsule 37.5 mg  37.5 mg  Oral  Q breakfast  Mellissa Kohut, MD     Lab Results: No results found for this or any previous visit (from the past 48 hour(s)).  Physical Findings:  AIMS: Facial and Oral Movements  Muscles of Facial Expression: None, normal  Lips and Perioral Area: None, normal  Jaw: None, normal  Tongue: None, normal,Extremity Movements  Upper (arms, wrists, hands, fingers): None, normal  Lower (legs, knees, ankles, toes): None, normal, Trunk Movements  Neck, shoulders, hips: None, normal, Overall  Severity  Severity of abnormal movements (highest score from questions above): None, normal  Incapacitation due to abnormal movements: None, normal  Patient's awareness of abnormal movements (rate only patient's report): No Awareness, Dental Status  Current problems with teeth and/or dentures?: No  Does patient usually wear dentures?: No  CIWA: CIWA-Ar Total: 0  COWS: COWS Total Score: 0  Review of Systems:  Neurological: No headaches, seizures or dizziness reported.  G.I.: The patient reports constipation today but with no stomach upset.  Musculoskeletal: The patient reports chronic back pain from degenerative disk disease which is under good control.  Time was spent today discussing with the patient his current symptoms. The patient states that he slept well last night without difficulty. He reports his appetite is good today and he reports some mild residual feelings of sadness, anhedonia and depressed mood. The patient adamantly denies any suicidal or homicidal ideations and also denies any auditory or visual hallucinations or delusional thinking. The patient also denies any anxiety symptoms today or any medication related side effects.  The patient states today that he is requesting discharge and would like a bus pass to transport him to the [employer]. This will be ordered at the patient's request.  Treatment Plan Summary:  1. Daily contact with patient to assess and evaluate symptoms and progress in treatment.  2. Medication management  3. The patient will deny suicidal ideations or homicidal ideations for 48 hours prior to discharge and have a depression and anxiety rating of 3 or less. The patient will also deny any auditory or visual hallucinations  or delusional thinking.  4. The patient will deny any symptoms of substance withdrawal at time of discharge.  Plan:  1. Will continue the medication atazanavir at 300 mgs po q am for HIV.  2. Will continue the medication  emtricitabine-tenofovir at 1 tablet po q day for HIV.  3. Will continue the medication ritonavir at 100 mgs po q am for HIV.  4. Will continue the medication lurasidone 40 mgs po q am for auditory hallucinations.  5. Will continue the medication naproxen 375 mgs po BID-WC for chronic back pain.  6. Will continue the medication Trazodone to 150 mgs po qhs for sleep.  7. Will continue the medication Celexa to 40 mgs po q am for depression and anxiety.  8. Will continue the medication Cogentin at 0.5 mgs po q am and hs for possible EPS.  9. Will continue the medication Effexor XR at 37.5 mgs po q am to also address his depressive symptoms.  10. Laboratory Studies reviewed.  11. EKG reviewed.  12. Will continue to monitor.  13. Will discharge the patient today as requested to outpatient follow up.  Suicide Risk:  Minimal: No identifiable suicidal ideation. Patients presenting with no risk factors but with morbid ruminations; may be classified as minimal risk based on the severity of the depressive symptoms  Plan Of Care/Follow-up recommendations:  Activity: As tolerated.  Diet: Heart Healthy Diet.  Other: Please take all medications only as directed and keep all scheduled follow up appointments. Please abstain from any use of alcohol or illicit drugs and return to your local Emergency Department should you have any thoughts of harming yourself or others.  Discharge destination:  Home  Is patient on multiple antipsychotic therapies at discharge:  No  Has Patient had three or more failed trials of antipsychotic monotherapy by history: N/A Recommended Plan for Multiple Antipsychotic Therapies: N/A Discharge Orders    Future Appointments: Provider: Department: Dept Phone: Center:   10/02/2011 3:00 PM Carlyle Basques, MD [facility] [id number] [facility]     Future Orders Please Complete By Expires   Diet - low sodium heart healthy      Increase activity slowly      Discharge instructions       Comments:   Please take all medications only as directed and keep all scheduled follow up appointments.  Please abstain from any illicit drugs or alcohol and return to your local Emergency Room should you have any thoughts of harming yourself or others.       Medication List     As of 10/01/2011  9:15 PM    TAKE these medications      Indication    albuterol 108 (90 BASE) MCG/ACT inhaler   Commonly known as: PROVENTIL HFA;VENTOLIN HFA   Inhale 2 puffs into the lungs every 6 (six) hours as needed for wheezing or shortness of breath.       atazanavir 300 MG capsule   Commonly known as: REYATAZ   Take 1 capsule (300 mg total) by mouth daily with breakfast. For viral infection.       benztropine 0.5 MG tablet   Commonly known as: COGENTIN   Take 1 tablet (0.5 mg total) by mouth 2 (two) times daily in the am and at bedtime.. For possible EPS.       citalopram 40 MG tablet   Commonly known as: CELEXA   Take 1 tablet (40 mg total) by mouth daily with breakfast. For depression.  emtricitabine-tenofovir 200-300 MG per tablet   Commonly known as: TRUVADA   Take 1 tablet by mouth daily. For viral infection.       Lurasidone HCl 20 MG Tabs   Take 1 tablet (20 mg total) by mouth 2 (two) times daily in the am and at bedtime.. Please take with food for auditory hallucinations.       naproxen 375 MG tablet   Commonly known as: NAPROSYN   Take 1 tablet (375 mg total) by mouth 2 (two) times daily with a meal. For back pain.       ritonavir 100 MG capsule   Commonly known as: NORVIR   Take 1 capsule (100 mg total) by mouth daily with breakfast. For viral infection.       traZODone 150 MG tablet   Commonly known as: DESYREL   Take 1 tablet (150 mg total) by mouth at bedtime. For sleep.       venlafaxine XR 37.5 MG 24 hr capsule   Commonly known as: EFFEXOR-XR   Take 1 capsule (37.5 mg total) by mouth daily with breakfast. To further address his depressive symptoms.              Follow-up Information    Follow up with [facility].  This appointment is to get set up for counseling. on 09/15/2011. (8 AM; Walk-in clinic 8-12 am and 1-3 pm as needed)    Contact information:   [name] [address] [phone]      Follow up with [facility] Sessions - this appointment is for medication management on 09/15/2011. (Walk-in clinic is available Monday through Friday from 8-9 AM. Please bring all of your  discharge paperwork with you to your appointment. This will assist  you with getting your medications.)    Contact information:   [place], [place]      Follow up with [facility] on 10/02/2011. (Appt. with Dr. Graylon Good at [facility].)    Contact information:   [place], [place] [address] [phone] fax [phone]        Follow-up recommendations:   Activities: Resume typical activities Diet: Resume typical diet Other: Follow up with outpatient provider and report any side effects to out patient prescriber.  Comments:  Take all your medications as prescribed by your mental healthcare provider. Report any adverse effects and or reactions from your medicines to your outpatient provider promptly. Patient is instructed and cautioned to not engage in alcohol and or illegal drug use while on prescription medicines. In the event of worsening symptoms, patient is instructed to call the crisis hotline, 911 and or go to the nearest ED for appropriate evaluation and treatment of symptoms. Follow-up with your primary care provider for your other medical issues, concerns and or health care needs.  Signed: Carloyn Jaeger 10/01/2011 9:15 PM

## 2011-10-02 ENCOUNTER — Ambulatory Visit: Payer: Self-pay | Admitting: Internal Medicine

## 2011-10-02 ENCOUNTER — Telehealth: Payer: Self-pay | Admitting: *Deleted

## 2011-10-02 NOTE — Telephone Encounter (Signed)
Called and left patient a voice mail to call the clinic to reschedule his appt, he no showed today. Myrtis Hopping CMA

## 2011-10-03 ENCOUNTER — Other Ambulatory Visit: Payer: Self-pay | Admitting: *Deleted

## 2011-10-03 DIAGNOSIS — B20 Human immunodeficiency virus [HIV] disease: Secondary | ICD-10-CM

## 2011-10-03 MED ORDER — ATAZANAVIR SULFATE 300 MG PO CAPS: 300.0000 mg | ORAL_CAPSULE | Freq: Every day | ORAL | Status: DC

## 2011-10-03 MED ORDER — EMTRICITABINE-TENOFOVIR DF 200-300 MG PO TABS: 1.0000 | ORAL_TABLET | Freq: Every day | ORAL | Status: DC

## 2011-10-03 MED ORDER — RITONAVIR 100 MG PO CAPS: 100.0000 mg | ORAL_CAPSULE | Freq: Every day | ORAL | Status: DC

## 2011-11-09 ENCOUNTER — Other Ambulatory Visit: Payer: Self-pay | Admitting: Internal Medicine

## 2011-11-09 ENCOUNTER — Ambulatory Visit (INDEPENDENT_AMBULATORY_CARE_PROVIDER_SITE_OTHER): Payer: Federal, State, Local not specified - Other | Admitting: Internal Medicine

## 2011-11-09 ENCOUNTER — Encounter: Payer: Self-pay | Admitting: Internal Medicine

## 2011-11-09 VITALS — BP 135/80 | HR 89 | Temp 97.8°F | Wt 216.0 lb

## 2011-11-09 DIAGNOSIS — Z23 Encounter for immunization: Secondary | ICD-10-CM

## 2011-11-09 DIAGNOSIS — B86 Scabies: Secondary | ICD-10-CM

## 2011-11-09 DIAGNOSIS — Z21 Asymptomatic human immunodeficiency virus [HIV] infection status: Secondary | ICD-10-CM

## 2011-11-09 DIAGNOSIS — B20 Human immunodeficiency virus [HIV] disease: Secondary | ICD-10-CM

## 2011-11-09 LAB — CBC WITH DIFFERENTIAL/PLATELET
Eosinophils Relative: 6 % — ABNORMAL HIGH (ref 0–5)
HCT: 42 % (ref 39.0–52.0)
Hemoglobin: 14.9 g/dL (ref 13.0–17.0)
Lymphocytes Relative: 39 % (ref 12–46)
MCHC: 35.5 g/dL (ref 30.0–36.0)
MCV: 85.4 fL (ref 78.0–100.0)
Monocytes Absolute: 0.6 10*3/uL (ref 0.1–1.0)
Monocytes Relative: 12 % (ref 3–12)
Neutro Abs: 2.2 10*3/uL (ref 1.7–7.7)

## 2011-11-09 LAB — COMPREHENSIVE METABOLIC PANEL
Albumin: 4.1 g/dL (ref 3.5–5.2)
BUN: 12 mg/dL (ref 6–23)
Calcium: 9.4 mg/dL (ref 8.4–10.5)
Chloride: 105 mEq/L (ref 96–112)
Glucose, Bld: 123 mg/dL — ABNORMAL HIGH (ref 70–99)
Potassium: 4 mEq/L (ref 3.5–5.3)

## 2011-11-09 MED ORDER — PERMETHRIN 5 % EX CREA: TOPICAL_CREAM | Freq: Once | CUTANEOUS | Status: DC

## 2011-11-09 NOTE — Progress Notes (Signed)
HIV CLINIC NOTE   RFV: routine follow up Subjective:    Patient ID: Dylan [name], male    DOB: 25-Jul-1965, 46 y.o.   MRN: [id number]  HPI 46 yo Male with HIV diagnosed in 1989, most recently on truvada/ATZ/r but stopped in late July when he was released from [place]. At that time his CD 4 count was 800s with undetectable VL. He also suffers from biplor/schizoaffective disorder where he has been in psych ED/hospitalized for [facility]. He is doing well. Staying in hotel, trying to find place to rent. He is back with his wife but they have had to stay at different shelters when they have been homeless, now they are living at hotel. He has a pruritic rash for which he thinks he has scabies, since wife recently treated and improved but he still has pruritic rash.mostly on arms and scattered lesions on back.  Otherwise, he is doing well with his HIV meds. He states that he is tacking them routinely. Occasionally have been off a few hours.  He is learning how to prioritize his appts, and responsibilities, like picking up his disability check, paying rent, "i've been institutionalized for a while". He only has 3 days left of his psych meds and needs to get refills  Current Outpatient Prescriptions on File Prior to Visit  Medication Sig Dispense Refill  . albuterol (PROVENTIL HFA;VENTOLIN HFA) 108 (90 BASE) MCG/ACT inhaler Inhale 2 puffs into the lungs every 6 (six) hours as needed for wheezing or shortness of breath.  8.5 g  0  . atazanavir (REYATAZ) 300 MG capsule Take 1 capsule (300 mg total) by mouth daily with breakfast. For viral infection.  30 capsule  3  . benztropine (COGENTIN) 0.5 MG tablet Take 1 tablet (0.5 mg total) by mouth 2 (two) times daily in the am and at bedtime.. For possible EPS.  60 tablet  0  . citalopram (CELEXA) 40 MG tablet Take 1 tablet (40 mg total) by mouth daily with breakfast. For depression.  30 tablet  0  . emtricitabine-tenofovir (TRUVADA) 200-300 MG per tablet Take 1  tablet by mouth daily. For viral infection.  30 tablet  3  . lurasidone 20 MG TABS Take 1 tablet (20 mg total) by mouth 2 (two) times daily in the am and at bedtime.. Please take with food for auditory hallucinations.  60 tablet  0  . naproxen (NAPROSYN) 375 MG tablet Take 1 tablet (375 mg total) by mouth 2 (two) times daily with a meal. For back pain.  60 tablet  0  . ritonavir (NORVIR) 100 MG capsule Take 1 capsule (100 mg total) by mouth daily with breakfast. For viral infection.  30 capsule  3  . traZODone (DESYREL) 150 MG tablet Take 150 mg by mouth at bedtime. For sleep.      Marland Kitchen venlafaxine XR (EFFEXOR-XR) 37.5 MG 24 hr capsule Take 1 capsule (37.5 mg total) by mouth daily with breakfast. To further address his depressive symptoms.  30 capsule  0   Active Ambulatory Problems    Diagnosis Date Noted  . HIV disease 08/17/2011  . Degenerative disc disease 08/21/2011  . Schizoaffective disorder, depressive type 09/05/2011  . Cocaine abuse 09/05/2011   Resolved Ambulatory Problems    Diagnosis Date Noted  . No Resolved Ambulatory Problems   Past Medical History  Diagnosis Date  . Human immunodeficiency virus (HIV)   . Hepatitis B   . Schizophrenia   . Bipolar 1 disorder  Review of Systems 10 point ROS negative except for rash    Objective:   Physical Exam BP 135/80  Pulse 89  Temp 97.8 F (36.6 C) (Oral)  Wt 216 lb (97.977 kg) Physical Exam  Constitutional:  oriented to person, place, and time. He appears well-developed and well-nourished. No distress.  HENT:  Mouth/Throat: Oropharynx is clear and moist. No oropharyngeal exudate.  Cardiovascular: Normal rate, regular rhythm and normal heart sounds. Exam reveals no gallop and no friction rub.  No murmur heard.  Pulmonary/Chest: Effort normal and breath sounds normal. No respiratory distress. He has no wheezes.  Lymphadenopathy:  no cervical adenopathy.  Neurological: He is alert and oriented to person, place, and  time.  Skin: Scattered small 1-48mm ulcerative lesions due to excoriation. He does have scattered raised submm lesions to arms mostly. No visible lesions on back Psychiatric: He has a normal mood and affect. His behavior is normal.         Assessment & Plan:  Hiv= check labs today, continue on his current regimen of truvada boosted atazanavir.  Health maintenance = getting flu shot today  Bipolar/schizoaffective disorder = discussed that since he has a week of hiv meds, that he should prioritize going to [facility] to get his psych meds before the weekend/before running out.  Scabies = rash could be c/w scabies. Some areas not completely consistent since he has excoriated the area. will do trial of elemite, gave directions to how to use.  rtc in 3 months

## 2011-11-10 LAB — HIV-1 RNA QUANT-NO REFLEX-BLD
HIV 1 RNA Quant: 5918 copies/mL — ABNORMAL HIGH (ref <20)
HIV-1 RNA Quant, Log: 3.77 {Log} — ABNORMAL HIGH (ref <1.30)

## 2011-11-10 LAB — T-HELPER CELL (CD4) - (RCID CLINIC ONLY): CD4 T Cell Abs: 500 uL (ref 400–2700)

## 2011-12-10 ENCOUNTER — Encounter (HOSPITAL_COMMUNITY): Payer: Self-pay | Admitting: Emergency Medicine

## 2011-12-10 ENCOUNTER — Emergency Department (HOSPITAL_COMMUNITY)
Admission: EM | Admit: 2011-12-10 | Discharge: 2011-12-12 | Disposition: A | Discharge status: Home / Self Care | Payer: Medicaid Other | Attending: Emergency Medicine | Admitting: Emergency Medicine

## 2011-12-10 DIAGNOSIS — F205 Residual schizophrenia: Secondary | ICD-10-CM | POA: Insufficient documentation

## 2011-12-10 DIAGNOSIS — F3289 Other specified depressive episodes: Secondary | ICD-10-CM | POA: Insufficient documentation

## 2011-12-10 DIAGNOSIS — F329 Major depressive disorder, single episode, unspecified: Secondary | ICD-10-CM | POA: Insufficient documentation

## 2011-12-10 DIAGNOSIS — F17200 Nicotine dependence, unspecified, uncomplicated: Secondary | ICD-10-CM | POA: Insufficient documentation

## 2011-12-10 DIAGNOSIS — B20 Human immunodeficiency virus [HIV] disease: Secondary | ICD-10-CM

## 2011-12-10 DIAGNOSIS — R443 Hallucinations, unspecified: Secondary | ICD-10-CM | POA: Insufficient documentation

## 2011-12-10 DIAGNOSIS — R45851 Suicidal ideations: Secondary | ICD-10-CM

## 2011-12-10 DIAGNOSIS — Z8619 Personal history of other infectious and parasitic diseases: Secondary | ICD-10-CM | POA: Insufficient documentation

## 2011-12-10 DIAGNOSIS — F319 Bipolar disorder, unspecified: Secondary | ICD-10-CM | POA: Insufficient documentation

## 2011-12-10 DIAGNOSIS — Z79899 Other long term (current) drug therapy: Secondary | ICD-10-CM | POA: Insufficient documentation

## 2011-12-10 LAB — COMPREHENSIVE METABOLIC PANEL
CO2: 26 mEq/L (ref 19–32)
Calcium: 9.5 mg/dL (ref 8.4–10.5)
Creatinine, Ser: 0.95 mg/dL (ref 0.50–1.35)
GFR calc Af Amer: >90 mL/min (ref >90)
GFR calc non Af Amer: >90 mL/min (ref >90)
Glucose, Bld: 103 mg/dL — ABNORMAL HIGH (ref 70–99)
Total Protein: 7.5 g/dL (ref 6.0–8.3)

## 2011-12-10 LAB — CBC
HCT: 42.9 % (ref 39.0–52.0)
Hemoglobin: 14.3 g/dL (ref 13.0–17.0)
MCH: 29.4 pg (ref 26.0–34.0)
MCV: 88.3 fL (ref 78.0–100.0)
RBC: 4.86 MIL/uL (ref 4.22–5.81)

## 2011-12-10 LAB — ETHANOL: Alcohol, Ethyl (B): <11 mg/dL (ref 0–11)

## 2011-12-10 LAB — RAPID URINE DRUG SCREEN, HOSP PERFORMED: Opiates: NOT DETECTED

## 2011-12-10 LAB — SALICYLATE LEVEL: Salicylate Lvl: <2 mg/dL — ABNORMAL LOW (ref 2.8–20.0)

## 2011-12-10 MED ORDER — ATAZANAVIR SULFATE 150 MG PO CAPS
300.0000 mg | ORAL_CAPSULE | Freq: Every evening | ORAL | Status: DC
  Administered 2011-12-10: 300 mg via ORAL
  Administered 2011-12-12: 150 mg via ORAL
  Filled 2011-12-10 (×3): qty 2

## 2011-12-10 MED ORDER — EMTRICITABINE-TENOFOVIR DF 200-300 MG PO TABS
1.0000 | ORAL_TABLET | Freq: Every evening | ORAL | Status: DC
  Administered 2011-12-10 – 2011-12-12 (×2): 1 via ORAL
  Filled 2011-12-10 (×3): qty 1

## 2011-12-10 MED ORDER — NAPROXEN 375 MG PO TABS
375.0000 mg | ORAL_TABLET | Freq: Two times a day (BID) | ORAL | Status: DC
  Administered 2011-12-11 – 2011-12-12 (×4): 375 mg via ORAL
  Filled 2011-12-10 (×7): qty 1

## 2011-12-10 MED ORDER — RITONAVIR 100 MG PO CAPS
100.0000 mg | ORAL_CAPSULE | Freq: Every day | ORAL | Status: DC
  Administered 2011-12-10 – 2011-12-11 (×2): 100 mg via ORAL
  Filled 2011-12-10 (×3): qty 1

## 2011-12-10 MED ORDER — ONDANSETRON HCL 4 MG PO TABS: 4.0000 mg | ORAL_TABLET | Freq: Three times a day (TID) | ORAL | Status: DC | PRN

## 2011-12-10 MED ORDER — IBUPROFEN 600 MG PO TABS: 600.0000 mg | ORAL_TABLET | Freq: Three times a day (TID) | ORAL | Status: DC | PRN

## 2011-12-10 MED ORDER — ALBUTEROL SULFATE HFA 108 (90 BASE) MCG/ACT IN AERS: 2.0000 | INHALATION_SPRAY | Freq: Four times a day (QID) | RESPIRATORY_TRACT | Status: DC | PRN

## 2011-12-10 MED ORDER — BENZTROPINE MESYLATE 1 MG PO TABS
0.5000 mg | ORAL_TABLET | ORAL | Status: DC
  Administered 2011-12-10 – 2011-12-11 (×2): 0.5 mg via ORAL
  Administered 2011-12-11 – 2011-12-12 (×2): 1 mg via ORAL
  Filled 2011-12-10 (×4): qty 1

## 2011-12-10 MED ORDER — CITALOPRAM HYDROBROMIDE 40 MG PO TABS
40.0000 mg | ORAL_TABLET | Freq: Every day | ORAL | Status: DC
  Administered 2011-12-11: 40 mg via ORAL
  Filled 2011-12-10 (×5): qty 1

## 2011-12-10 MED ORDER — VENLAFAXINE HCL ER 37.5 MG PO CP24: 37.5000 mg | ORAL_CAPSULE | Freq: Every day | ORAL | Status: DC

## 2011-12-10 MED ORDER — QUETIAPINE FUMARATE ER 300 MG PO TB24
300.0000 mg | ORAL_TABLET | Freq: Every day | ORAL | Status: DC
  Administered 2011-12-10 – 2011-12-11 (×2): 300 mg via ORAL
  Filled 2011-12-10 (×3): qty 1

## 2011-12-10 MED ORDER — NICOTINE 21 MG/24HR TD PT24
21.0000 mg | MEDICATED_PATCH | Freq: Every day | TRANSDERMAL | Status: DC
  Administered 2011-12-10 – 2011-12-12 (×3): 21 mg via TRANSDERMAL
  Filled 2011-12-10 (×3): qty 1

## 2011-12-10 MED ORDER — ALUM & MAG HYDROXIDE-SIMETH 200-200-20 MG/5ML PO SUSP: 30.0000 mL | ORAL | Status: DC | PRN

## 2011-12-10 MED ORDER — LORAZEPAM 1 MG PO TABS
1.0000 mg | ORAL_TABLET | Freq: Three times a day (TID) | ORAL | Status: DC | PRN
  Administered 2011-12-10 – 2011-12-11 (×2): 1 mg via ORAL
  Filled 2011-12-10 (×2): qty 1

## 2011-12-10 MED ORDER — ACETAMINOPHEN 325 MG PO TABS
650.0000 mg | ORAL_TABLET | ORAL | Status: DC | PRN
  Administered 2011-12-10: 650 mg via ORAL
  Filled 2011-12-10: qty 2

## 2011-12-10 MED ORDER — ZOLPIDEM TARTRATE 5 MG PO TABS: 5.0000 mg | ORAL_TABLET | Freq: Every evening | ORAL | Status: DC | PRN

## 2011-12-10 NOTE — ED Notes (Signed)
Patient states that his sister made him come here because he having SI. The patient is talking to the "shadow people" in the room. The patient also reports that they are telling him to hurt himself. The patient denies a plan but states "I would find a plan"

## 2011-12-10 NOTE — ED Notes (Signed)
Pt reports that he feels "calm." PA made aware.

## 2011-12-10 NOTE — ED Provider Notes (Signed)
History     CSN: [id number]  Arrival date & time 12/10/11  1742   First MD Initiated Contact with Patient 12/10/11 1947      Chief Complaint  Patient presents with  . Suicidal    (Consider location/radiation/quality/duration/timing/severity/associated sxs/prior treatment) HPI Comments: Patient presents to the ED voluntarily due to having suicidal thoughts.  He reports that he was attempting to find his sister's gun in order to shoot himself.  He also reports that he wanted to shoot the "shadows" around him.  He has been seeing shadows.  He has a history of Schizophrenia and has been noncompliant with his psychiatric medications.  He reports visual hallucinations and auditory hallucinations.  He states that he is hearing voices telling him to hurt himself.  He denies HI.  He denies any recent alcohol or recreational drug use.  The history is provided by the patient.    Past Medical History  Diagnosis Date  . Human immunodeficiency virus (HIV)   . Hepatitis B   . Schizophrenia   . Bipolar 1 disorder     Past Surgical History  Procedure Date  . Tumor removal     From Chest    No family history on file.  History  Substance Use Topics  . Smoking status: Current Every Day Smoker -- 0.4 packs/day for 15 years  . Smokeless tobacco: Current User    Types: Snuff  . Alcohol Use: 21.6 oz/week    36 Cans of beer per week     Comment: past      Review of Systems  Psychiatric/Behavioral: Positive for suicidal ideas, hallucinations, sleep disturbance and dysphoric mood. Negative for confusion and self-injury. The patient is nervous/anxious.   All other systems reviewed and are negative.    Allergies  Penicillins; Latex; and Tape  Home Medications   Current Outpatient Rx  Name  Route  Sig  Dispense  Refill  . ALBUTEROL SULFATE HFA 108 (90 BASE) MCG/ACT IN AERS   Inhalation   Inhale 2 puffs into the lungs every 6 (six) hours as needed for wheezing or shortness of  breath.   8.5 g   0   . ATAZANAVIR SULFATE 300 MG PO CAPS   Oral   Take 300 mg by mouth every evening. For viral infection.         Marland Kitchen BENZTROPINE MESYLATE 0.5 MG PO TABS   Oral   Take 1 tablet (0.5 mg total) by mouth 2 (two) times daily in the am and at bedtime.. For possible EPS.   60 tablet   0   . CITALOPRAM HYDROBROMIDE 40 MG PO TABS   Oral   Take 1 tablet (40 mg total) by mouth daily with breakfast. For depression.   30 tablet   0   . EMTRICITABINE-TENOFOVIR 200-300 MG PO TABS   Oral   Take 1 tablet by mouth every evening. For viral infection.         Marland Kitchen NAPROXEN 375 MG PO TABS   Oral   Take 1 tablet (375 mg total) by mouth 2 (two) times daily with a meal. For back pain.   60 tablet   0   . QUETIAPINE FUMARATE ER 150 MG PO TB24   Oral   Take 300 mg by mouth at bedtime.         Marland Kitchen RITONAVIR 100 MG PO CAPS   Oral   Take 100 mg by mouth at bedtime. For viral infection.  BP 128/88  Pulse 66  Temp 98.5 F (36.9 C) (Oral)  Resp 18  SpO2 99%  Physical Exam  Nursing note and vitals reviewed. Constitutional: He appears well-developed and well-nourished.  HENT:  Head: Normocephalic and atraumatic.  Mouth/Throat: Oropharynx is clear and moist.  Eyes: EOM are normal.  Neck: Normal range of motion. Neck supple.  Cardiovascular: Normal rate, regular rhythm and normal heart sounds.   Pulmonary/Chest: Effort normal and breath sounds normal.  Musculoskeletal: Normal range of motion.  Neurological: He is alert.  Skin: Skin is warm and dry.  Psychiatric: His speech is normal. He is not agitated and not aggressive. Cognition and memory are normal. He exhibits a depressed mood. He expresses suicidal ideation. He expresses no homicidal ideation. He expresses suicidal plans. He expresses no homicidal plans.    ED Course  Procedures (including critical care time)  Labs Reviewed  COMPREHENSIVE METABOLIC PANEL - Abnormal; Notable for the following:     Glucose, Bld 103 (*)     All other components within normal limits  SALICYLATE LEVEL - Abnormal; Notable for the following:    Salicylate Lvl 123456 (*)     All other components within normal limits  URINE RAPID DRUG SCREEN (HOSP PERFORMED) - Abnormal; Notable for the following:    Cocaine POSITIVE (*)     All other components within normal limits  ACETAMINOPHEN LEVEL  CBC  ETHANOL   No results found.   No diagnosis found.    MDM  Patient presenting with visual hallucinations and suicidal ideation.  He had a plan to shoot himself with a gun.  ACT team has been notified.  Psych holding orders have been placed.  Med rec orders have also been placed.        Sherlyn Lees Balm, PA-C 12/11/11 [phone]

## 2011-12-11 NOTE — ED Notes (Signed)
Pt refused Truvada and Reyataz stated that "medication should be take with Norvir to work properly".

## 2011-12-11 NOTE — ED Provider Notes (Signed)
Pt resting, nad.  Discussed w act team - placement pending.   Mirna Mires, MD 12/11/11 [phone]

## 2011-12-11 NOTE — ED Notes (Signed)
Pt wandering hallway stating that he cannot stay in his room because he feels like he will go crazy. Pt informed that he will be transferred to [facility] shortly.
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
## 2011-12-12 ENCOUNTER — Inpatient Hospital Stay (HOSPITAL_COMMUNITY)
Admission: EM | Admit: 2011-12-12 | Discharge: 2011-12-19 | DRG: 885 | Disposition: A | Discharge status: Home / Self Care | Payer: Medicaid Other | Source: Ambulatory Visit | Attending: Psychiatry | Admitting: Psychiatry

## 2011-12-12 ENCOUNTER — Encounter (HOSPITAL_COMMUNITY): Payer: Self-pay

## 2011-12-12 DIAGNOSIS — M129 Arthropathy, unspecified: Secondary | ICD-10-CM | POA: Diagnosis present

## 2011-12-12 DIAGNOSIS — Z21 Asymptomatic human immunodeficiency virus [HIV] infection status: Secondary | ICD-10-CM | POA: Diagnosis present

## 2011-12-12 DIAGNOSIS — F1410 Cocaine abuse, uncomplicated: Secondary | ICD-10-CM | POA: Diagnosis present

## 2011-12-12 DIAGNOSIS — R45851 Suicidal ideations: Secondary | ICD-10-CM

## 2011-12-12 DIAGNOSIS — B1910 Unspecified viral hepatitis B without hepatic coma: Secondary | ICD-10-CM | POA: Diagnosis present

## 2011-12-12 DIAGNOSIS — IMO0002 Reserved for concepts with insufficient information to code with codable children: Secondary | ICD-10-CM

## 2011-12-12 DIAGNOSIS — F259 Schizoaffective disorder, unspecified: Principal | ICD-10-CM | POA: Diagnosis present

## 2011-12-12 DIAGNOSIS — F251 Schizoaffective disorder, depressive type: Secondary | ICD-10-CM | POA: Diagnosis present

## 2011-12-12 DIAGNOSIS — B20 Human immunodeficiency virus [HIV] disease: Secondary | ICD-10-CM

## 2011-12-12 HISTORY — DX: Depression, unspecified: F32.A

## 2011-12-12 HISTORY — DX: Unspecified osteoarthritis, unspecified site: M19.90

## 2011-12-12 HISTORY — DX: Major depressive disorder, single episode, unspecified: F32.9

## 2011-12-12 MED ORDER — NICOTINE 21 MG/24HR TD PT24
21.0000 mg | MEDICATED_PATCH | Freq: Every day | TRANSDERMAL | Status: DC
  Filled 2011-12-12: qty 1

## 2011-12-12 MED ORDER — NAPROXEN 375 MG PO TABS
375.0000 mg | ORAL_TABLET | Freq: Two times a day (BID) | ORAL | Status: DC
  Administered 2011-12-13 – 2011-12-19 (×13): 375 mg via ORAL
  Filled 2011-12-12 (×17): qty 1

## 2011-12-12 MED ORDER — CITALOPRAM HYDROBROMIDE 20 MG PO TABS
30.0000 mg | ORAL_TABLET | Freq: Every morning | ORAL | Status: DC
  Administered 2011-12-12: 40 mg via ORAL
  Filled 2011-12-12: qty 1

## 2011-12-12 MED ORDER — NAPROXEN 500 MG PO TABS
500.0000 mg | ORAL_TABLET | Freq: Once | ORAL | Status: AC
  Administered 2011-12-12: 500 mg via ORAL
  Filled 2011-12-12 (×2): qty 1

## 2011-12-12 MED ORDER — BENZTROPINE MESYLATE 0.5 MG PO TABS
0.5000 mg | ORAL_TABLET | ORAL | Status: DC
  Administered 2011-12-13: 0.5 mg via ORAL
  Filled 2011-12-12 (×4): qty 1

## 2011-12-12 MED ORDER — MAGNESIUM HYDROXIDE 400 MG/5ML PO SUSP
30.0000 mL | Freq: Every day | ORAL | Status: DC | PRN
  Administered 2011-12-15 – 2011-12-16 (×2): 30 mL via ORAL

## 2011-12-12 MED ORDER — QUETIAPINE FUMARATE ER 300 MG PO TB24
300.0000 mg | ORAL_TABLET | Freq: Every day | ORAL | Status: DC
  Administered 2011-12-12: 300 mg via ORAL
  Filled 2011-12-12 (×3): qty 1

## 2011-12-12 MED ORDER — ATAZANAVIR SULFATE 150 MG PO CAPS
300.0000 mg | ORAL_CAPSULE | Freq: Every evening | ORAL | Status: DC
  Administered 2011-12-12 – 2011-12-18 (×7): 300 mg via ORAL
  Filled 2011-12-12 (×9): qty 2

## 2011-12-12 MED ORDER — RITONAVIR 100 MG PO CAPS
100.0000 mg | ORAL_CAPSULE | Freq: Every day | ORAL | Status: DC
  Administered 2011-12-12 – 2011-12-13 (×2): 100 mg via ORAL
  Filled 2011-12-12 (×5): qty 1

## 2011-12-12 MED ORDER — ALUM & MAG HYDROXIDE-SIMETH 200-200-20 MG/5ML PO SUSP: 30.0000 mL | ORAL | Status: DC | PRN

## 2011-12-12 MED ORDER — CITALOPRAM HYDROBROMIDE 40 MG PO TABS
40.0000 mg | ORAL_TABLET | Freq: Every day | ORAL | Status: DC
  Administered 2011-12-13 – 2011-12-19 (×7): 40 mg via ORAL
  Filled 2011-12-12 (×7): qty 1
  Filled 2011-12-12: qty 7
  Filled 2011-12-12 (×2): qty 1

## 2011-12-12 MED ORDER — ACETAMINOPHEN 325 MG PO TABS: 650.0000 mg | ORAL_TABLET | Freq: Four times a day (QID) | ORAL | Status: DC | PRN

## 2011-12-12 MED ORDER — ALBUTEROL SULFATE HFA 108 (90 BASE) MCG/ACT IN AERS: 2.0000 | INHALATION_SPRAY | Freq: Four times a day (QID) | RESPIRATORY_TRACT | Status: DC | PRN

## 2011-12-12 MED ORDER — ZIPRASIDONE HCL 20 MG PO CAPS
40.0000 mg | ORAL_CAPSULE | Freq: Two times a day (BID) | ORAL | Status: DC
  Administered 2011-12-12 (×2): 40 mg via ORAL
  Filled 2011-12-12: qty 2
  Filled 2011-12-12: qty 1

## 2011-12-12 MED ORDER — EMTRICITABINE-TENOFOVIR DF 200-300 MG PO TABS
1.0000 | ORAL_TABLET | Freq: Every evening | ORAL | Status: DC
  Administered 2011-12-13 – 2011-12-18 (×6): 1 via ORAL
  Filled 2011-12-12 (×8): qty 1

## 2011-12-12 NOTE — BHH Counselor (Signed)
Patient accepted to [facility] by Nena Polio, NP to Dr. Corena Pilgrim. Patient's room assignment is 400-1. The EDP (Dr. Lita Mains) was notified of patients disposition and agreed to plan of care. Patient's nurse also notified and will call report to [phone].

## 2011-12-12 NOTE — Progress Notes (Signed)
Patient ID: Dylan [name], male   DOB: October 22, 1965, 46 y.o.   MRN: [id number]  Admission Note:   D:46 yr male who presents VC in no acute distress for the treatment of SI and Depression. Pt appears flat and depressed. Pt was calm and cooperative with admission process. Pt presents with passive SI and contracts for safety upon admission. Pt positive for AVH, Pt states he was tired of hearing the voices and seeing the shadows  . Pt has Past medical Hx  HIV, Hepatitis B,  of tumor removal from chest , Depression . Skin was assessed upon admission. POC and unit policies explained and understanding verbalized. Consents obtained. Food and fluids offered, and fluids and snacks accepted. Pt had no additional questions or concerns. Pt complained of chronic back, knee, hip pain that was 8 out of 10.   A: 15 min checks for safety started. Pt given scheduled medications. Pt given naproxen 500 mg for back, hip, and knee pain.  R: Pt is taking medication. Pt has no complaints at this time.Pt receptive to treatment and safety maintained on unit.

## 2011-12-12 NOTE — ED Provider Notes (Signed)
BP 104/69  Pulse 61  Temp 97.3 F (36.3 C) (Oral)  Resp 18  SpO2 95% Recommends IVC and inpt hospitalization per telepsych. Accepted at [facility]. Awaiting bed  Julianne Rice, MD 12/12/11 [phone]

## 2011-12-12 NOTE — ED Notes (Signed)
Call to give report, was told that pt was not d/c at this time will called me when bed is ready

## 2011-12-12 NOTE — ED Provider Notes (Signed)
Medical screening examination/treatment/procedure(s) were performed by non-physician practitioner and as supervising physician I was immediately available for consultation/collaboration.  Carmin Muskrat, MD 12/12/11 1455

## 2011-12-12 NOTE — Progress Notes (Signed)
Psychoeducational Group Note  Date:  12/12/2011 Time:  2000  Group Topic/Focus:  Wrap-Up Group:   The focus of this group is to help patients review their daily goal of treatment and discuss progress on daily workbooks.  Participation Level:  Did Not Attend  Participation Quality:  N/A Patient did not attend  Affect:    Cognitive:    Insight:    Engagement in Group:    Additional Comments:  Patient did not attend wrap-up group this evening as patient was in bed, awake and asleep, for the duration of wrap-up group tonight.  Farheen Pfahler, Mertha Finders 12/12/2011, 8:34 PM

## 2011-12-12 NOTE — Tx Team (Signed)
Initial Interdisciplinary Treatment Plan  PATIENT STRENGTHS: (choose at least two) Ability for insight Communication skills General fund of knowledge  PATIENT STRESSORS: Health problems Legal issue Marital or family conflict Substance abuse   PROBLEM LIST: Problem List/Patient Goals Date to be addressed Date deferred Reason deferred Estimated date of resolution  Suicide      Depression                                                 DISCHARGE CRITERIA:  Ability to meet basic life and health needs Improved stabilization in mood, thinking, and/or behavior Motivation to continue treatment in a less acute level of care Reduction of life-threatening or endangering symptoms to within safe limits  PRELIMINARY DISCHARGE PLAN: Attend aftercare/continuing care group  PATIENT/FAMIILY INVOLVEMENT: This treatment plan has been presented to and reviewed with the patient, Dylan [name].  The patient and family have been given the opportunity to ask questions and make suggestions.  Vonzella Nipple A 12/12/2011, 11:56 PM

## 2011-12-13 ENCOUNTER — Encounter (HOSPITAL_COMMUNITY): Payer: Self-pay | Admitting: Psychiatry

## 2011-12-13 LAB — LIPID PANEL
Cholesterol: 210 mg/dL — ABNORMAL HIGH (ref 0–200)
HDL: 38 mg/dL — ABNORMAL LOW (ref >39)
Total CHOL/HDL Ratio: 5.5 RATIO
Triglycerides: 172 mg/dL — ABNORMAL HIGH (ref <150)

## 2011-12-13 LAB — TSH: TSH: 1.291 u[IU]/mL (ref 0.350–4.500)

## 2011-12-13 MED ORDER — NICOTINE POLACRILEX 2 MG MT GUM
2.0000 mg | CHEWING_GUM | OROMUCOSAL | Status: DC | PRN
  Administered 2011-12-13 – 2011-12-19 (×12): 2 mg via ORAL

## 2011-12-13 MED ORDER — RISPERIDONE 2 MG PO TBDP
2.0000 mg | ORAL_TABLET | Freq: Every day | ORAL | Status: DC
  Administered 2011-12-13 – 2011-12-14 (×2): 2 mg via ORAL
  Filled 2011-12-13 (×3): qty 1

## 2011-12-13 MED ORDER — BENZTROPINE MESYLATE 1 MG PO TABS
1.0000 mg | ORAL_TABLET | ORAL | Status: DC
  Administered 2011-12-13 – 2011-12-19 (×12): 1 mg via ORAL
  Filled 2011-12-13 (×3): qty 1
  Filled 2011-12-13: qty 14
  Filled 2011-12-13 (×9): qty 1
  Filled 2011-12-13: qty 14
  Filled 2011-12-13 (×2): qty 1

## 2011-12-13 MED ORDER — TRAZODONE HCL 50 MG PO TABS
50.0000 mg | ORAL_TABLET | Freq: Every evening | ORAL | Status: DC | PRN
  Administered 2011-12-13 – 2011-12-14 (×4): 50 mg via ORAL
  Filled 2011-12-13 (×8): qty 1

## 2011-12-13 NOTE — Clinical Social Work Note (Signed)
[facility] Notes:  (Counselor/Nursing/MHT/Case Management/Adjunct)  11/24/2011 12:00 PM  Type of Therapy:  Group Therapy  Participation Level:  Did Not Attend  [name], Dylan Relic 11/24/2011, 12:00 PM

## 2011-12-13 NOTE — Progress Notes (Signed)
Patient ID: Dylan [name], male   DOB: 05-14-1965, 46 y.o.   MRN: [id number] D: Patient in room on approach. Pt presented with depressed mood. Passive SI and contracts for safety. Pt continues to have auditory and visual hallucination but denies HI and pain. Pt attended evening wrap up group and Interacted appropriately with peers. Calm and cooperative with assessment. No acute distressed noted at this time. Pt encouraged to come to staff with any question or concerns   A: Met with pt 1:1. Medications administered as prescribed. Safety has been maintained with Q15 minutes observation. Supported and encouragement provided to attend groups.   R: Patient remains safe. He is complaint with medications and group programming. Safety has been maintained Q15 and continue current POC
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
## 2011-12-13 NOTE — Progress Notes (Signed)
[facility] LCSW Aftercare Discharge Planning Group Note  12/13/2011 10:00 AM  Participation Quality:  Did not attend despite invitation    Trish Mage 12/13/2011, 10:00 AM

## 2011-12-13 NOTE — Progress Notes (Signed)
Pt endorse SI but contracts for safety. Pt denies HI. Pt endorse AVH, reported hearing voices and seeing shadows. Pt reports back pain (chronic). Pt has been cooperative. Compliant with meds. Minimal interaction on the unit. Pacing in the hallway. Pt  depressed, pt stating "I can't seem to live a normal life".   Medications given as ordered per MD. Verbal support given. Pt encouraged to attend groups. 15 minute checks performed for safety. Shift assessment performed.  Pt is safe. Isolative. Depressed. Pleasant.

## 2011-12-13 NOTE — Progress Notes (Signed)
Psychoeducational Group Note  Date:  12/13/2011 Time:  2000  Group Topic/Focus:  Wrap-Up Group:   The focus of this group is to help patients review their daily goal of treatment and discuss progress on daily workbooks.  Participation Level:  Active  Participation Quality:  Appropriate  Affect:  Appropriate  Cognitive:  Appropriate  Insight:  Engaged  Engagement in Group:  Engaged  Additional Comments:  Pt stated that his day has been on and off good. His goal for tomorrow is to continue to go to groups.  Wynelle Fanny R 12/13/2011, 8:45 PM

## 2011-12-13 NOTE — Treatment Plan (Signed)
Interdisciplinary Treatment Plan Update (Adult)  Date: 12/13/2011  Time Reviewed: 8:28 AM   Progress in Treatment: Attending groups: No Participating in groups: No Taking medication as prescribed: Yes Tolerating medication: Yes   Family/Significant other contact made:  No Patient understands diagnosis:  Yes  As evidenced by asking help for mood stabilization and psychosis Discussing patient identified problems/goals with staff:  Yes See below Medical problems stabilized or resolved:  Yes Denies suicidal/homicidal ideation: Yes  In tx team Issues/concerns per patient self-inventory:  Not filled out Other:  New problem(s) identified: N/A  Reason for Continuation of Hospitalization: Depression Hallucinations Medication stabilization Other; describe Paranoia  Interventions implemented related to continuation of hospitalization:  Medication stabilization encourage group attendance and participation    Additional comments:  Estimated length of stay: 3-5 days  Discharge Plan: unknown, open to referral to   New goal(s): N/A  Review of initial/current patient goals per problem list:   1.  Goal(s): Eliminate SI  Met:  Yes  Target date:12/4  As evidenced [id number] report  2.  Goal (s):Stabilize mood  Met:  No  Target date:12/9  As evidenced by: Will rate depression and anxiety at 3 or less on self inventory  3.  Goal(s): Eliminate psychosis  Met:  No  Target date:12/9  As evidenced [id number] report/observation  4.  Goal(s): Identify comprehensive sobriety, mental wellness plan  Met:  No  Target date:12/9  As evidenced [id number] report with support from CM  Attendees: Patient:  Dylan [name] 12/13/2011 8:28 AM  Family:     Physician:  Corena Pilgrim 12/13/2011 8:28 AM   Nursing:  Darrol Angel RN  12/13/2011 8:28 AM   Clinical Social Worker:  Ripley Fraise 12/13/2011 8:28 AM   Extender:  Nena Polio PA 12/13/2011 8:28 AM   Other:     Other:     Other:     Other:       Scribe for Treatment Team:   Trish Mage, 12/13/2011 8:28 AM

## 2011-12-13 NOTE — H&P (Signed)
Psychiatric Admission Assessment Adult  Patient Identification:  Dylan [name] Date of Evaluation:  12/13/2011 Chief Complaint:  Auditory, visual hallucinations, paranoid delusion, depression and suicidal ideations.  History of Present Illness: Patient is a 46 year old man, with long history of mental illness who is currently unemployed, single and a registered sex offender. Patient presents with suicidal ideations, depression, psychosis and delusions. He reports command auditory hallucinations; hearing voices telling him to take his sister's gun shoot the shadow that he is seeing and then shoot himself. He reports that he was released from [place] 16 days ago and on probation for one year. Mood Symptoms:  Appetite, Depression, Guilt, Hopelessness, Psychomotor Retardation, SI, Depression Symptoms:  anhedonia, feelings of worthlessness/guilt, suicidal thoughts with specific plan, (Hypo) Manic Symptoms:  Delusions, Hallucinations, Anxiety Symptoms:  None reported Psychotic Symptoms:  Delusions, Hallucinations: Auditory Command:  hearing voices telling him to hurt himself Visual Paranoia,  PTSD Symptoms: None reported by the patient.  Past Psychiatric History: Diagnosis:Bipolar disorder, depression  [facility]  Outpatient Care:[facility]  Substance Abuse Care:drinks alcohol occasionally, sporadic use of crack cocaine  Self-Mutilation:denies  Suicidal Attempts:denies  Violent Behaviors:denies   Past Medical History:   Past Medical History  Diagnosis Date  . Human immunodeficiency virus (HIV)   . Hepatitis B   . Arthritis     Allergies:   Allergies  Allergen Reactions  . Penicillins Other (See Comments)    Childhood allergy  . Latex Rash  . Tape Rash   PTA Medications: Prescriptions prior to admission  Medication Sig Dispense Refill  . albuterol (PROVENTIL HFA;VENTOLIN HFA) 108 (90 BASE) MCG/ACT inhaler Inhale 2 puffs into the  lungs every 6 (six) hours as needed for wheezing or shortness of breath.  8.5 g  0  . atazanavir (REYATAZ) 300 MG capsule Take 300 mg by mouth every evening. For viral infection.      . benztropine (COGENTIN) 0.5 MG tablet Take 1 tablet (0.5 mg total) by mouth 2 (two) times daily in the am and at bedtime.. For possible EPS.  60 tablet  0  . citalopram (CELEXA) 40 MG tablet Take 1 tablet (40 mg total) by mouth daily with breakfast. For depression.  30 tablet  0  . emtricitabine-tenofovir (TRUVADA) 200-300 MG per tablet Take 1 tablet by mouth every evening. For viral infection.      . naproxen (NAPROSYN) 375 MG tablet Take 1 tablet (375 mg total) by mouth 2 (two) times daily with a meal. For back pain.  60 tablet  0  . QUEtiapine Fumarate (SEROQUEL XR) 150 MG 24 hr tablet Take 300 mg by mouth at bedtime.      . ritonavir (NORVIR) 100 MG capsule Take 100 mg by mouth at bedtime. For viral infection.        Previous Psychotropic Medications:  Medication/Dose                 Substance Abuse History in the last 12 months: Substance Age of 1st Use Last Use Amount Specific Type  Nicotine      Alcohol      Cannabis      Opiates      Cocaine      Methamphetamines      LSD      Ecstasy      Benzodiazepines      Caffeine      Inhalants      Others:  Consequences of Substance Abuse: Legal Consequences:  history of being charged with disorderly conduct after alcohol intoxication  Social History: Current Place of Residence:   Place of Birth:   Family Members: Marital Status:  Single Children:  Sons:  Daughters: Relationships: Education:  [employer] Problems/Performance: Religious Beliefs/Practices: History of Abuse (Emotional/Phsycial/Sexual) Occupational Experiences; Military History:  None. Legal History: Hobbies/Interests:  Family History:  History reviewed. No pertinent family history.  Mental Status  Examination/Evaluation: Objective:  Appearance: Casual and Disheveled  Eye Contact::  Minimal  Speech:  Slow and clear  Volume:  Decreased  Mood:  Depressed  Affect:  Constricted  Thought Process:  Coherent  Orientation:  Full  Thought Content:  Delusions, Hallucinations: Auditory Visual and Paranoid Ideation  Suicidal Thoughts:  Yes.  with intent/plan  Homicidal Thoughts:  No  Memory:  Immediate;   Fair Recent;   Fair Remote;   Fair  Judgement:  Impaired  Insight:  Shallow  Psychomotor Activity:  Psychomotor Retardation  Concentration:  Fair  Recall:  Fair  Akathisia:  No  Handed:  Right  AIMS (if indicated):     Assets:  Communication Skills Desire for Improvement  Sleep:  Number of Hours: 5.75     Laboratory/X-Ray Psychological Evaluation(s)      Assessment:    AXIS I:  Schizoaffective Disorder, depressed type. Cocaine use disorder AXIS II:  Deferred AXIS III:   Past Medical History  Diagnosis Date  . Human immunodeficiency virus (HIV)   . Hepatitis B   . Arthritis    AXIS IV:  economic problems, housing problems, occupational problems, other psychosocial or environmental problems, problems related to legal system/crime and problems related to social environment AXIS V:  21-30 behavior considerably influenced by delusions or hallucinations OR serious impairment in judgment, communication OR inability to function in almost all areas  Treatment Plan/Recommendations:1. Admit for crisis management and stabilization. 2. Medication management to reduce current symptoms to base line and improve the patient's overall level of functioning 3. Treat health problems as indicated. 4. Develop treatment plan to decrease risk of relapse upon discharge and the need for readmission. 5. Psycho-social education regarding relapse prevention and self care. 6. Health care follow up as needed for medical problems. 7. Restart home medications where appropriate.    Treatment Plan  Summary: Daily contact with patient to assess and evaluate symptoms and progress in treatment Medication management Current Medications:  Current Facility-Administered Medications  Medication Dose Route Frequency Provider Last Rate Last Dose  . acetaminophen (TYLENOL) tablet 650 mg  650 mg Oral Q6H PRN Laverle Hobby, PA      . albuterol (PROVENTIL HFA;VENTOLIN HFA) 108 (90 BASE) MCG/ACT inhaler 2 puff  2 puff Inhalation Q6H PRN Laverle Hobby, PA      . alum & mag hydroxide-simeth (MAALOX/MYLANTA) 200-200-20 MG/5ML suspension 30 mL  30 mL Oral Q4H PRN Laverle Hobby, PA      . atazanavir (REYATAZ) capsule 300 mg  300 mg Oral QPM Laverle Hobby, PA   300 mg at 12/12/11 2335  . benztropine (COGENTIN) tablet 0.5 mg  0.5 mg Oral BH-qamhs Laverle Hobby, PA   0.5 mg at 12/13/11 0810  . citalopram (CELEXA) tablet 40 mg  40 mg Oral Q breakfast Laverle Hobby, PA   40 mg at 12/13/11 0810  . emtricitabine-tenofovir (TRUVADA) 200-300 MG per tablet 1 tablet  1 tablet Oral QPM Laverle Hobby, PA      . magnesium hydroxide (MILK  OF MAGNESIA) suspension 30 mL  30 mL Oral Daily PRN Laverle Hobby, PA      . naproxen (NAPROSYN) tablet 375 mg  375 mg Oral BID WC Laverle Hobby, PA   375 mg at 12/13/11 0810  . [COMPLETED] naproxen (NAPROSYN) tablet 500 mg  500 mg Oral Once Laverle Hobby, PA   500 mg at 12/12/11 2351  . nicotine polacrilex (NICORETTE) gum 2 mg  2 mg Oral PRN Lyllie Cobbins   2 mg at 12/13/11 0651  . QUEtiapine (SEROQUEL XR) 24 hr tablet 300 mg  300 mg Oral QHS Laverle Hobby, PA   300 mg at 12/12/11 2335  . ritonavir (NORVIR) capsule 100 mg  100 mg Oral QHS Laverle Hobby, PA   100 mg at 12/12/11 2335  . [DISCONTINUED] nicotine (NICODERM CQ - dosed in mg/24 hours) patch 21 mg  21 mg Transdermal Q0600 Laverle Hobby, PA       Facility-Administered Medications Ordered in Other Encounters  Medication Dose Route Frequency Provider Last Rate Last Dose  . [DISCONTINUED] acetaminophen  (TYLENOL) tablet 650 mg  650 mg Oral Q4H PRN Sherlyn Lees Wingen, PA-C   650 mg at 12/10/11 2310  . [DISCONTINUED] albuterol (PROVENTIL HFA;VENTOLIN HFA) 108 (90 BASE) MCG/ACT inhaler 2 puff  2 puff Inhalation Q6H PRN Teofilo Pod, PA-C      . [DISCONTINUED] alum & mag hydroxide-simeth (MAALOX/MYLANTA) 200-200-20 MG/5ML suspension 30 mL  30 mL Oral PRN Sherlyn Lees Wingen, PA-C      . [DISCONTINUED] atazanavir (REYATAZ) capsule 300 mg  300 mg Oral QPM Sherlyn Lees Wingen, PA-C   150 mg at 12/12/11 1756  . [DISCONTINUED] benztropine (COGENTIN) tablet 0.5 mg  0.5 mg Oral BH-qamhs Heather Van Lyons, PA-C   1 mg at 12/12/11 [id number]  . [DISCONTINUED] citalopram (CELEXA) tablet 30 mg  30 mg Oral q morning - 10a Julianne Rice, MD   40 mg at 12/12/11 0903  . [DISCONTINUED] emtricitabine-tenofovir (TRUVADA) 200-300 MG per tablet 1 tablet  1 tablet Oral QPM Teofilo Pod, PA-C   1 tablet at 12/12/11 1757  . [DISCONTINUED] ibuprofen (ADVIL,MOTRIN) tablet 600 mg  600 mg Oral Q8H PRN Sherlyn Lees Wingen, PA-C      . [DISCONTINUED] LORazepam (ATIVAN) tablet 1 mg  1 mg Oral Q8H PRN Teofilo Pod, PA-C   1 mg at 12/11/11 2138  . [DISCONTINUED] naproxen (NAPROSYN) tablet 375 mg  375 mg Oral BID WC Sherlyn Lees Wingen, PA-C   375 mg at 12/12/11 1756  . [DISCONTINUED] nicotine (NICODERM CQ - dosed in mg/24 hours) patch 21 mg  21 mg Transdermal Daily Sherlyn Lees Wingen, PA-C   21 mg at 12/12/11 1802  . [DISCONTINUED] ondansetron (ZOFRAN) tablet 4 mg  4 mg Oral Q8H PRN Sherlyn Lees Wingen, PA-C      . [DISCONTINUED] QUEtiapine (SEROQUEL XR) 24 hr tablet 300 mg  300 mg Oral QHS Teofilo Pod, PA-C   300 mg at 12/11/11 2138  . [DISCONTINUED] ritonavir (NORVIR) capsule 100 mg  100 mg Oral QHS Teofilo Pod, PA-C   100 mg at 12/11/11 2137  . [DISCONTINUED] ziprasidone (GEODON) capsule 40 mg  40 mg Oral BID WC Julianne Rice, MD   40 mg at 12/12/11 1756  . [DISCONTINUED] zolpidem (AMBIEN) tablet 5 mg  5 mg  Oral QHS PRN Teofilo Pod, PA-C        Observation Level/Precautions:  C.O.  Laboratory:  routine  Psychotherapy:  Medications:    Routine PRN Medications:  Yes  Consultations:    Discharge Concerns:    Other:     Corena Pilgrim, MD 12/4/201311:27 AM

## 2011-12-14 MED ORDER — RITONAVIR 100 MG PO CAPS
100.0000 mg | ORAL_CAPSULE | Freq: Every day | ORAL | Status: DC
  Administered 2011-12-14 – 2011-12-18 (×5): 100 mg via ORAL
  Filled 2011-12-14 (×6): qty 1

## 2011-12-14 NOTE — Progress Notes (Signed)
Patient ID: Dylan [name], male   DOB: 03/17/1965, 46 y.o.   MRN: [id number] Patient remains with depressed mood; sad and flat affect.  Patient met with MD, CW and nurse this morning.  His discharge was discussed and he was told that he has been accepted at [facility] to a 28-day program; however, his admission is not until 12-17.  Patient was asked if he could stay at his sister's until he could start the program and he said no, his sister had packed his things and he could not go back there.  Patient stated he had put a gun to his head and threatened to kill himself and did not want to talk to himself.  He reports passive SI this morning and some AH.  He denies HI.  It was suggested that he go to a shelter until he is admitted to [facility].  Patient was not receptive to that idea.  Continue to monitor medication management and MD orders.  Collaborate with treatment team members.  Safety checks continued every 15 minutes per protocol.  Patient's behavior is appropriate; encourage and support patient.

## 2011-12-14 NOTE — Progress Notes (Signed)
[facility] MD Progress Note  12/14/2011 11:35 AM Dylan [name]  MRN:  [id number] Subjective: " I am still feeling depressed and hearing voices" Diagnosis:  Axis I: Schizoaffective Disorder, depressed type                    Cocaine use disorder  ADL's:  Intact  Sleep: Fair  Appetite:  Fair  Suicidal Ideation: yes Plan:  denies  Intent:  denies Means:  denies Homicidal Ideation: no Plan:  denies Intent:  denies Means:  denies AEB (as evidenced by): Patient continue to report hearing voices telling him to hurt himself and visual hallucinations. He also reports paranoia and violent thoughts. Patient states that he is frustrated due to not been able to return home to his sister. Case manager is trying to get him placed in [facility] recovery program upon stabilization. Patient is yet committed to that plan. He is compliant with his medications and has not reported any adverse reactions to it.  Psychiatric Specialty Exam: Review of Systems  Constitutional: Negative.   HENT: Negative.   Eyes: Negative.   Respiratory: Negative.   Cardiovascular: Negative.   Gastrointestinal: Negative.   Genitourinary: Negative.   Musculoskeletal: Negative for myalgias.  Skin: Negative.   Neurological: Negative.   Endo/Heme/Allergies: Negative.   Psychiatric/Behavioral: Negative for depression, suicidal ideas, hallucinations and substance abuse. The patient does not have insomnia.     Blood pressure 106/60, pulse 53, temperature 97.3 F (36.3 C), temperature source Oral, resp. rate 16, height 6\' 1"  (1.854 m), weight 91.627 kg (202 lb).Body mass index is 26.65 kg/(m^2).  General Appearance: Casual and Fairly Groomed  Engineer, water::  Fair  Speech:  Clear and Coherent  Volume:  Decreased  Mood:  Depressed  Affect:  Restricted  Thought Process:  Coherent and Linear  Orientation:  Full (Time, Place, and Person)  Thought Content:  Delusions and Hallucinations: Auditory Visual  Suicidal Thoughts:  Yes.   without intent/plan  Homicidal Thoughts:  No  Memory:  Immediate;   Fair Recent;   Fair Remote;   Fair  Judgement:  Impaired  Insight:  Shallow  Psychomotor Activity:  Decreased  Concentration:  Fair  Recall:  Fair  Akathisia:  No  Handed:  Right  AIMS (if indicated):     Assets:  Communication Skills Desire for Improvement  Sleep:  Number of Hours: 5.75    Current Medications: Current Facility-Administered Medications  Medication Dose Route Frequency Provider Last Rate Last Dose  . acetaminophen (TYLENOL) tablet 650 mg  650 mg Oral Q6H PRN Laverle Hobby, PA      . albuterol (PROVENTIL HFA;VENTOLIN HFA) 108 (90 BASE) MCG/ACT inhaler 2 puff  2 puff Inhalation Q6H PRN Laverle Hobby, PA      . alum & mag hydroxide-simeth (MAALOX/MYLANTA) 200-200-20 MG/5ML suspension 30 mL  30 mL Oral Q4H PRN Laverle Hobby, PA      . atazanavir (REYATAZ) capsule 300 mg  300 mg Oral QPM Laverle Hobby, PA   300 mg at 12/13/11 1710  . benztropine (COGENTIN) tablet 1 mg  1 mg Oral BH-qamhs Kyeisha Janowicz   1 mg at 12/14/11 0815  . citalopram (CELEXA) tablet 40 mg  40 mg Oral Q breakfast Laverle Hobby, PA   40 mg at 12/14/11 0815  . emtricitabine-tenofovir (TRUVADA) 200-300 MG per tablet 1 tablet  1 tablet Oral QPM Laverle Hobby, PA   1 tablet at 12/13/11 1710  . magnesium hydroxide (MILK OF  MAGNESIA) suspension 30 mL  30 mL Oral Daily PRN Laverle Hobby, PA      . naproxen (NAPROSYN) tablet 375 mg  375 mg Oral BID WC Laverle Hobby, PA   375 mg at 12/14/11 0815  . nicotine polacrilex (NICORETTE) gum 2 mg  2 mg Oral PRN Caster Fayette   2 mg at 12/13/11 2227  . risperiDONE (RISPERDAL M-TABS) disintegrating tablet 2 mg  2 mg Oral QHS Kyvon Hu   2 mg at 12/13/11 2135  . ritonavir (NORVIR) capsule 100 mg  100 mg Oral QHS Laverle Hobby, PA   100 mg at 12/13/11 2135  . traZODone (DESYREL) tablet 50 mg  50 mg Oral QHS,MR X 1 Laverle Hobby, PA   50 mg at 12/13/11 2326  . [DISCONTINUED]  benztropine (COGENTIN) tablet 0.5 mg  0.5 mg Oral BH-qamhs Laverle Hobby, PA   0.5 mg at 12/13/11 0810  . [DISCONTINUED] QUEtiapine (SEROQUEL XR) 24 hr tablet 300 mg  300 mg Oral QHS Laverle Hobby, PA   300 mg at 12/12/11 2335    Lab Results:  Results for orders placed during the hospital encounter of 12/12/11 (from the past 48 hour(s))  TSH     Status: Normal   Collection Time   12/13/11  6:20 AM      Component Value Range Comment   TSH 1.291  0.350 - 4.500 uIU/mL   LIPID PANEL     Status: Abnormal   Collection Time   12/13/11  6:20 AM      Component Value Range Comment   Cholesterol 210 (*) 0 - 200 mg/dL    Triglycerides 172 (*) <150 mg/dL    HDL 38 (*) >39 mg/dL    Total CHOL/HDL Ratio 5.5      VLDL 34  0 - 40 mg/dL    LDL Cholesterol 138 (*) 0 - 99 mg/dL     Physical Findings: AIMS: Facial and Oral Movements Muscles of Facial Expression: None, normal Lips and Perioral Area: None, normal Jaw: None, normal Tongue: None, normal,Extremity Movements Upper (arms, wrists, hands, fingers): None, normal Lower (legs, knees, ankles, toes): None, normal, Trunk Movements Neck, shoulders, hips: None, normal, Overall Severity Severity of abnormal movements (highest score from questions above): None, normal Incapacitation due to abnormal movements: None, normal Patient's awareness of abnormal movements (rate only patient's report): No Awareness, Dental Status Current problems with teeth and/or dentures?: No Does patient usually wear dentures?: No  CIWA:  CIWA-Ar Total: 0  COWS:  COWS Total Score: 1   Treatment Plan Summary: Daily contact with patient to assess and evaluate symptoms and progress in treatment Medication management  Plan:  Medical Decision Making Problem Points:  Established problem, stable/improving (1), Review of last therapy session (1), Review of psycho-social stressors (1) and Self-limited or minor (1) Data Points:  Decision to obtain old records (1) Order  Aims Assessment (2) Review or order clinical lab tests (1) Review of medication regiment & side effects (2)  I certify that inpatient services furnished can reasonably be expected to improve the patient's condition.   Corena Pilgrim, MD 12/14/2011, 11:35 AM

## 2011-12-14 NOTE — Progress Notes (Signed)
Adult Psychosocial Assessment Update Interdisciplinary Team  Previous [facility] admissions/discharges:  Admissions Discharges  Date:current Date:  Date:09/04/11 Date:  Date: Date:  Date: Date:  Date: Date:   Changes since the last Psychosocial Assessment (including adherence to outpatient mental health and/or substance abuse treatment, situational issues contributing to decompensation and/or relapse). Dylan [name] has been living pillar to post.  He stays with his sister periodically until he wears out his welcome.  He says he was most recently staying there when he demanded she give him her gun so he could kill himself.  She packed his things and told him to get out.  He has not been going to follow up appointments, has not been taking his meds, and has been using cocaine and alcohol.             Discharge Plan 1. Will you be returning to the same living situation after discharge?   Yes: No:  X    If no, what is your plan? Wants to go to rehab.           2. Would you like a referral for services when you are discharged? Yes:X     If yes, for what services?  No:       See above       Summary and Recommendations (to be completed by the evaluator) Restart meds that Shana reports have worked in the past.  Audiological scientist group attendance and participation,  Refer to [employer].  Set up for follow up at mental health, identify place to go while waiting for bed at [facility].                       Signature:  Trish Mage, 12/14/2011 2:28 PM

## 2011-12-14 NOTE — Progress Notes (Signed)
[facility] LCSW Aftercare Discharge Planning Group Note  12/14/2011 9:30 AM  Participation Quality: DID NOT ATTEND GROUP.   Smart, Heather N 12/14/2011, 9:30 AM

## 2011-12-14 NOTE — Clinical Social Work Note (Signed)
[facility] Group Notes:  (Counselor/Nursing/MHT/Case Management/Adjunct)  12/14/2011 2:13 PM   Type of Therapy:  Group Therapy  Participation Level:  Active  Participation Quality:  Appropriate and Sharing  Affect:  Appropriate  Cognitive:  Alert and Appropriate  Insight:  Engaged  Engagement in Group:  Engaged  Engagement in Therapy:  Engaged  Modes of Intervention:  Activity, Discussion, Education and Support  Summary of Progress/Problems: Topic-Guided Meditation, Balance, and Discussion Activity: The topic for group was balance in life through guided meditation activity. Pt participated in meditation activity and discussed how this was helpful to him.Patient participated in group discussion-the ungame- and talked about a time in the past three months where he felt proud of himself. He felt proud for making amends with several family members and for deciding to get treatment for mental illness and substance abuse problems.    Smart, Heather N 11/23/2011, 2:34 PM

## 2011-12-14 NOTE — Progress Notes (Signed)
[facility] Group Notes:  (Counselor/Nursing/MHT/Case Management/Adjunct)  12/14/2011 11:59 AM  Type of Therapy:  Psychoeducational Skills  Participation Level:  Active  Participation Quality:  Appropriate  Affect:  Flat  Cognitive:  Delusional  Insight:  Lacking  Engagement in Group:  Limited  Engagement in Therapy:  n/a  Modes of Intervention:  Activity, Discussion and Socialization  Summary of Progress/Problems: Dylan [name] participated in sharing activity with group. Dylan [name] stated that one goal he would like to accomplish is today is to get better by taking the medication the doctor gives him and going to groups, his favorite holiday food is "sweet potato pie made by his wife, mother, or sister", and one thing that makes him unique is that he "will not give up". Dylan [name] participated in activity writing something unique about himself on a snowflake and hung it in the hall "tree".   Dylan [name] 12/14/2011, 11:59 AM

## 2011-12-14 NOTE — Progress Notes (Signed)
Patient did attend the evening karaoke group.  

## 2011-12-14 NOTE — Clinical Social Work Note (Signed)
Pt attended tx team this morning. Pt informed that he is eligible for admittance at [place]. Tx team and pt discussed living arrangement options for time between discharge at the hospital and admittance into [facility]. Pt stated that returning to his sister's home is not an option and he is unwilling to speak with her at this time. Pt reluctant to go to shelter in [facility] but after discussing lack of alternative choices, became more receptive to this option. Pt informed that he could receive bus pass and part bus money needed for travel to [name] after discharge from hospital. Pt encouraged to attend and participate in groups.

## 2011-12-14 NOTE — Progress Notes (Signed)
Patient ID: Dylan [name], male   DOB: [birth year], 46 y.o.   MRN: [id number] D: Patient in dayroom on approach. Pt presented with depressed mood and flat affect. Pt continues to have auditory hallucination and passive SI but contracts for safety.  Pt denies HI and pain. Pt attended karaoke this evening and interacted appropriately with peers.  No acute distressed noted. Cooperative with assessment. Pt encouraged to come to staff with any question or concerns  A: Met with pt 1:1. Medications administered as prescribed. Safety has been maintained with Q15 minutes observation. Supported and encouragement provided to attend groups.  R: Patient remains safe. He is complaint with medications and group programming. Safety has been maintained Q15 and continue current POC.

## 2011-12-15 MED ORDER — RISPERIDONE 2 MG PO TBDP
3.0000 mg | ORAL_TABLET | Freq: Every day | ORAL | Status: DC
  Administered 2011-12-15 – 2011-12-17 (×3): 3 mg via ORAL
  Filled 2011-12-15 (×4): qty 1

## 2011-12-15 MED ORDER — TRAZODONE HCL 100 MG PO TABS
100.0000 mg | ORAL_TABLET | Freq: Every evening | ORAL | Status: DC | PRN
  Administered 2011-12-15: 100 mg via ORAL
  Filled 2011-12-15 (×6): qty 1

## 2011-12-15 MED ORDER — RISPERIDONE 1 MG PO TBDP
1.0000 mg | ORAL_TABLET | Freq: Once | ORAL | Status: AC
  Administered 2011-12-15: 1 mg via ORAL
  Filled 2011-12-15 (×2): qty 1

## 2011-12-15 NOTE — Progress Notes (Signed)
The patient attended the A. A. Meeting on the 300 hallway.

## 2011-12-15 NOTE — Progress Notes (Signed)
[facility] Group Notes:  (Counselor/Nursing/MHT/Case Management/Adjunct)  12/15/2011 8:17 PM  Type of Therapy:  Group Therapy  Participation Level:  Active  Participation Quality:  Appropriate, Attentive and Sharing  Affect:  Appropriate  Cognitive:  Appropriate  Insight:  Good  Engagement in Group:  Engaged  Engagement in Therapy:  Engaged  Modes of Intervention:  Activity, Clarification, Socialization and Support  Summary of Progress/Problems:  Topic for group was balance and what that might look like for individuals after discharge.  Patients were given opportunity to choose a photograph from a selection and share how it relates to balance for them.  Pt choose a photograph of some freshly painted pottery and shared that he needed to create som good leisure time plans as "I've been hanging out with nothing to do and then finding things to do that are not so good for me."  Pt shared how he felt overwhelmed when released from prison after 10 years and had multiple people and places to followup with and things just felt overwhelming so he did nothing.   Lyla Glassing 12/15/2011, 8:17 PM

## 2011-12-15 NOTE — Progress Notes (Signed)
Patient ID: Dylan [name], male   DOB: 12-Oct-1965, 46 y.o.   MRN: [id number] [facility] MD Progress Note  12/15/2011 9:14 AM JELANI URVINA  MRN:  [id number] Subjective: " I did not sleep well last night and still feeling depressd" Diagnosis:  Axis I: Schizoaffective Disorder, depressed type                    Cocaine use disorder  ADL's:  Intact  Sleep: Fair  Appetite:  Fair  Suicidal Ideation: yes Plan:  denies  Intent:  denies Means:  denies Homicidal Ideation: no Plan:  denies Intent:  denies Means:  denies AEB (as evidenced by): Patient report ongoing auditory hallucinations. He reports hearing voices but the voices are no longer telling him to hurt himself. He states that he continues to see shadow. He reports paranoia and violent thoughts. He states that he is still feeling depressed, has fleeting suicidal thoughts, has no motivation and has difficulty sleeping. He continues to feel frustrated about not having a stable housing. However, patient is compliant with his medications and has not reported any adverse reactions to it.  Psychiatric Specialty Exam: Review of Systems  Constitutional: Negative.   HENT: Negative.   Eyes: Negative.   Respiratory: Negative.   Cardiovascular: Negative.   Gastrointestinal: Negative.   Genitourinary: Negative.   Musculoskeletal: Negative for myalgias.  Skin: Negative.   Neurological: Negative.   Endo/Heme/Allergies: Negative.   Psychiatric/Behavioral: Negative for depression, suicidal ideas, hallucinations and substance abuse. The patient does not have insomnia.     Blood pressure 106/60, pulse 53, temperature 97.3 F (36.3 C), temperature source Oral, resp. rate 16, height 6\' 1"  (1.854 m), weight 91.627 kg (202 lb).Body mass index is 26.65 kg/(m^2).  General Appearance: Casual and Fairly Groomed  Engineer, water::  Fair  Speech:  Clear and Coherent  Volume:  Decreased  Mood:  Depressed  Affect:  Restricted  Thought Process:  Coherent and  Linear  Orientation:  Full (Time, Place, and Person)  Thought Content:  Delusions and Hallucinations: Auditory Visual  Suicidal Thoughts:  Yes.  without intent/plan  Homicidal Thoughts:  No  Memory:  Immediate;   Fair Recent;   Fair Remote;   Fair  Judgement:  Impaired  Insight:  Shallow  Psychomotor Activity:  Decreased  Concentration:  Fair  Recall:  Fair  Akathisia:  No  Handed:  Right  AIMS (if indicated):     Assets:  Communication Skills Desire for Improvement  Sleep:  Number of Hours: 4.5    Current Medications: Current Facility-Administered Medications  Medication Dose Route Frequency Provider Last Rate Last Dose  . acetaminophen (TYLENOL) tablet 650 mg  650 mg Oral Q6H PRN Laverle Hobby, PA      . albuterol (PROVENTIL HFA;VENTOLIN HFA) 108 (90 BASE) MCG/ACT inhaler 2 puff  2 puff Inhalation Q6H PRN Laverle Hobby, PA      . alum & mag hydroxide-simeth (MAALOX/MYLANTA) 200-200-20 MG/5ML suspension 30 mL  30 mL Oral Q4H PRN Laverle Hobby, PA      . atazanavir (REYATAZ) capsule 300 mg  300 mg Oral QPM Laverle Hobby, PA   300 mg at 12/14/11 1718  . benztropine (COGENTIN) tablet 1 mg  1 mg Oral BH-qamhs Pellegrino Kennard   1 mg at 12/15/11 0757  . citalopram (CELEXA) tablet 40 mg  40 mg Oral Q breakfast Laverle Hobby, PA   40 mg at 12/15/11 0757  . emtricitabine-tenofovir (TRUVADA) 200-300 MG per  tablet 1 tablet  1 tablet Oral QPM Laverle Hobby, PA   1 tablet at 12/14/11 1718  . magnesium hydroxide (MILK OF MAGNESIA) suspension 30 mL  30 mL Oral Daily PRN Laverle Hobby, PA      . naproxen (NAPROSYN) tablet 375 mg  375 mg Oral BID WC Laverle Hobby, PA   375 mg at 12/15/11 0757  . nicotine polacrilex (NICORETTE) gum 2 mg  2 mg Oral PRN Shanterica Biehler   2 mg at 12/15/11 0757  . [COMPLETED] risperiDONE (RISPERDAL M-TABS) disintegrating tablet 1 mg  1 mg Oral Once Darrol Jump, MD   1 mg at 12/15/11 0538  . risperiDONE (RISPERDAL M-TABS) disintegrating tablet 2 mg  2  mg Oral QHS Raynell Scott   2 mg at 12/14/11 2152  . ritonavir (NORVIR) capsule 100 mg  100 mg Oral QHS Taurus Willis   100 mg at 12/14/11 1730  . traZODone (DESYREL) tablet 100 mg  100 mg Oral QHS,MR X 1 Margee Trentham      . [DISCONTINUED] ritonavir (NORVIR) capsule 100 mg  100 mg Oral QHS Laverle Hobby, PA   100 mg at 12/13/11 2135  . [DISCONTINUED] traZODone (DESYREL) tablet 50 mg  50 mg Oral QHS,MR X 1 Laverle Hobby, PA   50 mg at 12/14/11 2303    Lab Results:  No results found for this or any previous visit (from the past 48 hour(s)).  Physical Findings: AIMS: Facial and Oral Movements Muscles of Facial Expression: None, normal Lips and Perioral Area: None, normal Jaw: None, normal Tongue: None, normal,Extremity Movements Upper (arms, wrists, hands, fingers): None, normal Lower (legs, knees, ankles, toes): None, normal, Trunk Movements Neck, shoulders, hips: None, normal, Overall Severity Severity of abnormal movements (highest score from questions above): None, normal Incapacitation due to abnormal movements: None, normal Patient's awareness of abnormal movements (rate only patient's report): No Awareness, Dental Status Current problems with teeth and/or dentures?: No Does patient usually wear dentures?: No  CIWA:  CIWA-Ar Total: 0  COWS:  COWS Total Score: 1   Treatment Plan Summary: Daily contact with patient to assess and evaluate symptoms and progress in treatment Medication management  Plan: 1. I will increase Risperdal M-TAB to 3mg  twice a day to address psychosis and delusions 2. I will continue Celexa 40mg  daily for depression 3. I will continue Benztropin 1mg  twice daily for EPD prevention 4. CM will continue to work on disposition 5. Patient encourage to attend group therapy and other [place].  Medical Decision Making Problem Points:  Established problem, stable/improving (1), Review of last therapy session (1), Review of psycho-social stressors (1)  and Self-limited or minor (1) Data Points:  Decision to obtain old records (1) Order Aims Assessment (2) Review or order clinical lab tests (1) Review of medication regiment & side effects (2)  I certify that inpatient services furnished can reasonably be expected to improve the patient's condition.   Corena Pilgrim, MD 12/15/2011, 9:14 AM

## 2011-12-15 NOTE — Progress Notes (Signed)
D- Olamide is out on unit interacting with peers and attending groups. Behavior is appropriate. Compliant with scheduled medications and no prn's requested. Rates depression at 7 and hopelessness at5.  "Off and on" SI and contracts for safety.  Energy is low and low ability to pat attention.  A- Support and encouragement given.  12 step concepts reenforced.  Continue with current POC and evaluation of treatment goals. Continue 15' checks for safety. R- Remains safe. Appropriate affect/mood.

## 2011-12-15 NOTE — Progress Notes (Signed)
[place] INPATIENT:  Family/Significant Other Suicide Prevention Education  Suicide Prevention Education:  Patient Refusal for Family/Significant Other Suicide Prevention Education: The patient ZYIER VIATOR has refused to provide written consent for family/significant other to be provided Family/Significant Other Suicide Prevention Education during admission and/or prior to discharge.  Physician notified. Avitaj was staying with his sister prior to admission.  He knows he burnt that bridge, and does not want to talk to her or allow me to call either.  She is the only family member/friend that he can identify.  He is planning on staying at [facility] prior to admission to [facility] rehab.  I went over suicide prevention information with [name].  Roque Lias B 12/15/2011, 1:14 PM

## 2011-12-15 NOTE — Progress Notes (Signed)
[place] Group Notes:  (Counselor/Nursing/MHT/Case Management/Adjunct)  12/15/2011 11:43 AM  Type of Therapy:  Discharge Planning  Participation Level:  Minimal  Participation Quality:  Attentive  Affect:  Flat  Cognitive:  Oriented  Insight:  Limited  Engagement in Group:  Limited  Engagement in Therapy:  Limited  Modes of Intervention:  Clarification, Exploration and Support  Summary of Progress/Problems:  Patient shared that he is experiencing no suicidal or homicidal ideation.  He rates both his depression and anxiety at a 7 on a scale of 1 to 10 with 10 being the highest level. Dylan [name] was present and attentive for entire group.    Lyla Glassing 12/15/2011, 11:43 AM

## 2011-12-15 NOTE — Progress Notes (Signed)
Patient ID: Dylan [name], male   DOB: 14-Jul-1965, 46 y.o.   MRN: [id number] D. The patient has a flat affect and depressed mood. Spent a great deal of time talking on the phone. Requested to attend 300 hall substance abuse group this evening. A. Met with patient 1:1 to assess.  Reviewed and administered HS medications. R. Denied any suicidal ideation. Admits to sometimes still hearing voices, but denies any at present. Stated that the evening A.A. Meeting was very good and he enjoyed attending.

## 2011-12-16 DIAGNOSIS — F1410 Cocaine abuse, uncomplicated: Secondary | ICD-10-CM

## 2011-12-16 DIAGNOSIS — F259 Schizoaffective disorder, unspecified: Principal | ICD-10-CM

## 2011-12-16 MED ORDER — TRAZODONE HCL 150 MG PO TABS
150.0000 mg | ORAL_TABLET | Freq: Every evening | ORAL | Status: DC | PRN
  Administered 2011-12-16 – 2011-12-17 (×2): 150 mg via ORAL
  Filled 2011-12-16 (×7): qty 1

## 2011-12-16 NOTE — Progress Notes (Signed)
Psychoeducational Group Note  Date:  12/16/2011 Time:  1030  Group Topic/Focus:  Making Healthy Choices:   The focus of this group is to help patients identify negative/unhealthy choices they were using prior to admission and identify positive/healthier coping strategies to replace them upon discharge.  Participation Level:  Active  Participation Quality:  Appropriate and Attentive  Affect:  Appropriate  Cognitive:  Alert and Appropriate  Insight:  Developing/Improving  Engagement in Group:  Engaged  Additional Comments:  Jobe Igo 12/16/2011, 11:37 AM

## 2011-12-16 NOTE — Progress Notes (Signed)
D- Patient is out in miliue interacting with peers and attending groups with good participation. Is insightful with recovery process and reenforcement of 12 steps done. Rates depression and hopelessness at 6. Denies SI. Reports poor sleep and improving appetite. A- Positive support and feedback given. No s/s of withdrawal. Compliant with scheduled medications and no prn's required. Continue current POC and evaluation of treatment goals. Continue 15' checks for safety.  R- Remains safe. Appropriate affect/mood/behavior.

## 2011-12-16 NOTE — Clinical Social Work Note (Signed)
[place] Group Notes:  (Clinical Social Work)  12/16/2011  11:15-11:45AM  Summary of Progress/Problems:   The main focus of today's process group was for the patient to identify ways in which they have in the past sabotaged their own recovery and reasons they may have done this/what they received from doing it.  We then worked to identify a specific plan to avoid doing this when discharged from the hospital for this admission.  The patient expressed that he has two ways of sabotaging himself typically:  1) he thinks he is "alright" and stops taking his medications; and 2) he thinks he can handle just one beer and ends up escalating to excessive alcohol and drug use.  He talked about needing more than himself, and hopes to get a sponsor and attend [place] groups.  He also will consider allowing his wife to take charge of his medication and administer them to him, as he initially stated maybe he should put up a sign but acknowledged this may not be sufficient.  Type of Therapy:  Group Therapy - Process  Participation Level:  Active  Participation Quality:  Appropriate, Sharing and Supportive  Affect:  Appropriate  Cognitive:  Alert, Appropriate and Oriented  Insight:  Engaged  Engagement in Group:  Engaged  Engagement in Therapy:  Engaged  Modes of Intervention:  Clarification, Education, Limit-setting, Problem-solving, Socialization, Support and Processing   Dylan [name], [place] 12/16/2011

## 2011-12-16 NOTE — Progress Notes (Signed)
Patient ID: Dylan [name], male   DOB: 07-Jul-1965, 46 y.o.   MRN: [id number] [facility] MD Progress Note  12/16/2011 6:51 PM DEKARI KOETTING  MRN:  [id number] Subjective: " I did not sleep well last night and still feeling depressd" Diagnosis:  Axis I: Schizoaffective Disorder, depressed type                    Cocaine use disorder  ADL's:  Intact  Sleep:poor  Appetite:  Fair  Suicidal Ideation: yes Plan:  denies  Intent:  denies Means:  denies Homicidal Ideation: no Plan:  denies Intent:  denies Means:  denies AEB (as evidenced by): Patient report no auditory hallucinations now and no VH now. Reports poor sleep. Thinks he is angry today for some reason  Psychiatric Specialty Exam: Review of Systems  Constitutional: Negative.   HENT: Negative.   Eyes: Negative.   Respiratory: Negative.   Cardiovascular: Negative.   Gastrointestinal: Negative.   Genitourinary: Negative.   Musculoskeletal: Negative for myalgias.  Skin: Negative.   Neurological: Negative.   Endo/Heme/Allergies: Negative.   Psychiatric/Behavioral: Negative for depression, suicidal ideas, hallucinations and substance abuse. The patient does not have insomnia.     Blood pressure 133/69, pulse 59, temperature 97.5 F (36.4 C), temperature source Oral, resp. rate 18, height 6\' 1"  (1.854 m), weight 91.627 kg (202 lb).Body mass index is 26.65 kg/(m^2).  General Appearance: Casual and Fairly Groomed  Engineer, water::  Fair  Speech:  Clear and Coherent  Volume:  Decreased  Mood:  Depressed  Affect:  Restricted  Thought Process:  Coherent and Linear  Orientation:  Full (Time, Place, and Person)  Thought Content:  No avh  Suicidal Thoughts:  no  Homicidal Thoughts:  No  Memory:  Immediate;   Fair Recent;   Fair Remote;   Fair  Judgement:  Impaired  Insight:  Shallow  Psychomotor Activity:  Decreased  Concentration:  Fair  Recall:  Fair  Akathisia:  No  Handed:  Right  AIMS (if indicated):     Assets:   Communication Skills Desire for Improvement  Sleep:  Number of Hours: 5.5    Current Medications: Current Facility-Administered Medications  Medication Dose Route Frequency Provider Last Rate Last Dose  . acetaminophen (TYLENOL) tablet 650 mg  650 mg Oral Q6H PRN Laverle Hobby, PA      . albuterol (PROVENTIL HFA;VENTOLIN HFA) 108 (90 BASE) MCG/ACT inhaler 2 puff  2 puff Inhalation Q6H PRN Laverle Hobby, PA      . alum & mag hydroxide-simeth (MAALOX/MYLANTA) 200-200-20 MG/5ML suspension 30 mL  30 mL Oral Q4H PRN Laverle Hobby, PA      . atazanavir (REYATAZ) capsule 300 mg  300 mg Oral QPM Laverle Hobby, PA   300 mg at 12/16/11 1714  . benztropine (COGENTIN) tablet 1 mg  1 mg Oral BH-qamhs Mojeed Akintayo   1 mg at 12/16/11 0928  . citalopram (CELEXA) tablet 40 mg  40 mg Oral Q breakfast Laverle Hobby, PA   40 mg at 12/16/11 [id number]  . emtricitabine-tenofovir (TRUVADA) 200-300 MG per tablet 1 tablet  1 tablet Oral QPM Laverle Hobby, PA   1 tablet at 12/16/11 1715  . magnesium hydroxide (MILK OF MAGNESIA) suspension 30 mL  30 mL Oral Daily PRN Laverle Hobby, PA   30 mL at 12/16/11 0550  . naproxen (NAPROSYN) tablet 375 mg  375 mg Oral BID WC Laverle Hobby, PA   375  mg at 12/16/11 1715  . nicotine polacrilex (NICORETTE) gum 2 mg  2 mg Oral PRN Mojeed Akintayo   2 mg at 12/16/11 0929  . risperiDONE (RISPERDAL M-TABS) disintegrating tablet 3 mg  3 mg Oral QHS Mojeed Akintayo   3 mg at 12/15/11 2148  . ritonavir (NORVIR) capsule 100 mg  100 mg Oral QHS Mojeed Akintayo   100 mg at 12/16/11 1714  . traZODone (DESYREL) tablet 150 mg  150 mg Oral QHS,MR X 1 Raiford Simmonds, MD      . [DISCONTINUED] traZODone (DESYREL) tablet 100 mg  100 mg Oral QHS,MR X 1 Mojeed Akintayo   100 mg at 12/15/11 2149    Lab Results:  No results found for this or any previous visit (from the past 57 hour(s)).  Physical Findings: AIMS: Facial and Oral Movements Muscles of Facial Expression: None, normal Lips and  Perioral Area: None, normal Jaw: None, normal Tongue: None, normal,Extremity Movements Upper (arms, wrists, hands, fingers): None, normal Lower (legs, knees, ankles, toes): None, normal, Trunk Movements Neck, shoulders, hips: None, normal, Overall Severity Severity of abnormal movements (highest score from questions above): None, normal Incapacitation due to abnormal movements: None, normal Patient's awareness of abnormal movements (rate only patient's report): No Awareness, Dental Status Current problems with teeth and/or dentures?: No Does patient usually wear dentures?: No  CIWA:  CIWA-Ar Total: 0  COWS:  COWS Total Score: 1   Treatment Plan Summary: Daily contact with patient to assess and evaluate symptoms and progress in treatment Medication management  Plan: 1. I will increase trazoodne to 150 mg qhs for sleep.  Medical Decision Making Problem Points:  Established problem, stable/improving (1), Review of last therapy session (1), Review of psycho-social stressors (1) and Self-limited or minor (1) Data Points:  Decision to obtain old records (1) Order Aims Assessment (2) Review or order clinical lab tests (1) Review of medication regiment & side effects (2)  I certify that inpatient services furnished can reasonably be expected to improve the patient's condition.   Raiford Simmonds, MD 12/16/2011, 6:51 PM

## 2011-12-17 NOTE — Progress Notes (Signed)
Patient ID: Dylan [name], male   DOB: 1965/12/14, 46 y.o.   MRN: [id number] D. The patient has a brighter affect tonight. Interacting appropriately in the milieu. Requested to attend 400 hall substance abuse group. A. Met with patient 1:1. Discussed discharge plan. Administered HS medication. R. The patient enjoyed the A.A. Meeting. Stated the speaker was very good. Denied any suicidal ideation. Looking forward to attending [facility] after discharge. Compliant with medication.

## 2011-12-17 NOTE — Progress Notes (Signed)
Patient ID: Dylan [name], male   DOB: 24-Apr-1965, 46 y.o.   MRN: [id number] D. The patient has a blunted affect. He is concerned about where he will be staying before his bed is available at [facility]. He knows he will be going to a shelter in [facility], but doesn't know how he will get there or anything about the area. Interacting appropriately in the milieu. A. Met with patient 1:1. Gave verbal support and encouraged him to speak with his case manager tomorrow regarding his discharge arrangements. Administered medications. R. Compliant with medications. Denies any A/V hallucinations. Denies any suicidal thoughts. Attended group on the 400 hall this evening because he forgot to go to the 300 hall A.A. meeting.

## 2011-12-17 NOTE — Progress Notes (Signed)
Patient ID: Dylan [name], male   DOB: 07/29/1965, 46 y.o.   MRN: [id number] [facility] MD Progress Note  12/17/2011 5:43 PM Dylan [name]  MRN:  [id number]   Subjective:   Good sleep. Patient report no AVH today.  Thinks he is getting better.   Diagnosis:  Axis I: Schizoaffective Disorder, depressed type                    Cocaine use disorder  ADL's:  Intact  Sleep: fair  Appetite:  Fair  Suicidal Ideation: yes Plan:  denies  Intent:  denies Means:  denies Homicidal Ideation: no Plan:  denies Intent:  denies Means:  denies AEB (as evidenced by):   Psychiatric Specialty Exam: Review of Systems  Constitutional: Negative.   HENT: Negative.   Eyes: Negative.   Respiratory: Negative.   Cardiovascular: Negative.   Gastrointestinal: Negative.   Genitourinary: Negative.   Musculoskeletal: Negative for myalgias.  Skin: Negative.   Neurological: Negative.   Endo/Heme/Allergies: Negative.   Psychiatric/Behavioral: Negative for depression, suicidal ideas, hallucinations and substance abuse. The patient does not have insomnia.     Blood pressure 129/74, pulse 65, temperature 96.5 F (35.8 C), temperature source Oral, resp. rate 18, height 6\' 1"  (1.854 m), weight 91.627 kg (202 lb).Body mass index is 26.65 kg/(m^2).  General Appearance: Casual and Fairly Groomed  Engineer, water::  Fair  Speech:  Clear and Coherent  Volume:  Decreased  Mood:  Depressed  Affect:  Restricted  Thought Process:  Coherent and Linear  Orientation:  Full (Time, Place, and Person)  Thought Content:  No avh  Suicidal Thoughts:  no  Homicidal Thoughts:  No  Memory:  Immediate;   Fair Recent;   Fair Remote;   Fair  Judgement:  Impaired  Insight:  Shallow  Psychomotor Activity:  Decreased  Concentration:  Fair  Recall:  Fair  Akathisia:  No  Handed:  Right  AIMS (if indicated):     Assets:  Communication Skills Desire for Improvement  Sleep:  Number of Hours: 5.25    Current  Medications: Current Facility-Administered Medications  Medication Dose Route Frequency Provider Last Rate Last Dose  . acetaminophen (TYLENOL) tablet 650 mg  650 mg Oral Q6H PRN Laverle Hobby, PA      . albuterol (PROVENTIL HFA;VENTOLIN HFA) 108 (90 BASE) MCG/ACT inhaler 2 puff  2 puff Inhalation Q6H PRN Laverle Hobby, PA      . alum & mag hydroxide-simeth (MAALOX/MYLANTA) 200-200-20 MG/5ML suspension 30 mL  30 mL Oral Q4H PRN Laverle Hobby, PA      . atazanavir (REYATAZ) capsule 300 mg  300 mg Oral QPM Laverle Hobby, PA   300 mg at 12/17/11 1613  . benztropine (COGENTIN) tablet 1 mg  1 mg Oral BH-qamhs Mojeed Akintayo   1 mg at 12/17/11 0848  . citalopram (CELEXA) tablet 40 mg  40 mg Oral Q breakfast Laverle Hobby, PA   40 mg at 12/17/11 0848  . emtricitabine-tenofovir (TRUVADA) 200-300 MG per tablet 1 tablet  1 tablet Oral QPM Laverle Hobby, PA   1 tablet at 12/17/11 1614  . magnesium hydroxide (MILK OF MAGNESIA) suspension 30 mL  30 mL Oral Daily PRN Laverle Hobby, PA   30 mL at 12/16/11 0550  . naproxen (NAPROSYN) tablet 375 mg  375 mg Oral BID WC Laverle Hobby, PA   375 mg at 12/17/11 1614  . nicotine polacrilex (NICORETTE) gum 2 mg  2 mg Oral PRN Mojeed Akintayo   2 mg at 12/17/11 1338  . risperiDONE (RISPERDAL M-TABS) disintegrating tablet 3 mg  3 mg Oral QHS Mojeed Akintayo   3 mg at 12/16/11 2118  . ritonavir (NORVIR) capsule 100 mg  100 mg Oral QHS Mojeed Akintayo   100 mg at 12/17/11 1614  . traZODone (DESYREL) tablet 150 mg  150 mg Oral QHS,MR X 1 Raiford Simmonds, MD   150 mg at 12/16/11 2118    Lab Results:  No results found for this or any previous visit (from the past 48 hour(s)).  Physical Findings: AIMS: Facial and Oral Movements Muscles of Facial Expression: None, normal Lips and Perioral Area: None, normal Jaw: None, normal Tongue: None, normal,Extremity Movements Upper (arms, wrists, hands, fingers): None, normal Lower (legs, knees, ankles, toes): None,  normal, Trunk Movements Neck, shoulders, hips: None, normal, Overall Severity Severity of abnormal movements (highest score from questions above): None, normal Incapacitation due to abnormal movements: None, normal Patient's awareness of abnormal movements (rate only patient's report): No Awareness, Dental Status Current problems with teeth and/or dentures?: No Does patient usually wear dentures?: No  CIWA:  CIWA-Ar Total: 0  COWS:  COWS Total Score: 1   Treatment Plan Summary: Daily contact with patient to assess and evaluate symptoms and progress in treatment Medication management  Plan:   1. continue current meds  Medical Decision Making Problem Points:  Established problem, stable/improving (1), Review of last therapy session (1), Review of psycho-social stressors (1) and Self-limited or minor (1) Data Points:  Decision to obtain old records (1) Order Aims Assessment (2) Review or order clinical lab tests (1) Review of medication regiment & side effects (2)  I certify that inpatient services furnished can reasonably be expected to improve the patient's condition.   Raiford Simmonds, MD 12/17/2011, 5:43 PM

## 2011-12-17 NOTE — Progress Notes (Signed)
The focus of this group is to help patients review their daily goal of treatment and discuss progress on daily workbooks. Pt attended the evening group and actively participated in the discussion about expressing needs and finding positives.

## 2011-12-17 NOTE — Progress Notes (Signed)
Patient ID: Dylan [name], male   DOB: Jun 01, 1965, 46 y.o.   MRN: [id number] Psychoeducational Group Note  Date:  12/17/2011 Time:  1000am  Group Topic/Focus:  Making Healthy Choices:   The focus of this group is to help patients identify negative/unhealthy choices they were using prior to admission and identify positive/healthier coping strategies to replace them upon discharge.  Participation Level:  Did Not Attend  Participation Quality:    Affect:   Additional Comments:   Pricilla Larsson 12/17/2011,1:43 PM

## 2011-12-17 NOTE — Progress Notes (Addendum)
Psychoeducational Group Note  Date:  12/17/2011 Time:  0930am  Group Topic/Focus:  Making Healthy Choices:   The focus of this group is to help patients identify negative/unhealthy choices they were using prior to admission and identify positive/healthier coping strategies to replace them upon discharge.  Participation Level:  Did Not Attend  Participation Quality:    Affect:   Additional Comments: self inventory group   Pricilla Larsson 12/17/2011,10:01 AM

## 2011-12-17 NOTE — Progress Notes (Signed)
Patient ID: Dylan [name], male   DOB: 07/23/1965, 46 y.o.   MRN: [id number] 12-17-11 nursing shift note: D: pt came to the medication window this am and was asked to attend group.  A: he took his am medication with encouragement. R: he did not come to groups as requested. rn will continue to support and encourage. q 15 min checks continue.

## 2011-12-17 NOTE — Clinical Social Work Note (Signed)
[place] Group Notes: (Clinical Social Work)   12/17/2011   11:15-11:45am   Type of Therapy:  Group Therapy   Participation Level:  Did Not Attend    Selmer Dominion, LCSW 12/17/2011, 12:33 PM

## 2011-12-18 MED ORDER — TRAZODONE HCL 100 MG PO TABS
200.0000 mg | ORAL_TABLET | Freq: Every day | ORAL | Status: DC
  Administered 2011-12-18: 200 mg via ORAL
  Filled 2011-12-18 (×2): qty 2
  Filled 2011-12-18: qty 14

## 2011-12-18 MED ORDER — RISPERIDONE 2 MG PO TBDP
4.0000 mg | ORAL_TABLET | Freq: Every day | ORAL | Status: DC
  Administered 2011-12-18: 4 mg via ORAL
  Filled 2011-12-18 (×2): qty 2
  Filled 2011-12-18: qty 14

## 2011-12-18 NOTE — Progress Notes (Signed)
D: Patient sitting in the dayroom on approach.  Patient states he had a good day.  Patient states he went to his groups but did not learn anything.  Patient states no longer has anxiety or depression. Patient denies SI/HI and denies AVH.  Patient states he is ready to go home.  A: Staff to monitor Q 15 mins for safety.  Encouragement and support offered.  Scheduled medications administered per orders. R: Patient remains safe on the unit.  Patient calm, cooperative and taking administered medications.  Patient attended group tonight.

## 2011-12-18 NOTE — Progress Notes (Signed)
Psychoeducational Group Note  Date:  12/18/2011 Time:  1130   Group Topic/Focus:  Self Care:   The focus of this group is to help patients understand the importance of self-care in order to improve or restore emotional, physical, spiritual, interpersonal, and financial health.  Participation Level:  Active  Participation Quality:  Appropriate  Affect:  Appropriate  Cognitive:  Appropriate  Insight:  Improving  Engagement in Group:  Limited  Additional Comments:    [name] 12/18/2011, 7:03 PM

## 2011-12-18 NOTE — Progress Notes (Signed)
D: Pt denies SI/HI/VH. Pt endorse AH occasionally. Pt c/o of chronic back pain. Pt also c/o not sleeping well last night, reporting that he woke up at 2:30 am. Pt participated in groups today. Pt walking around with earplugs and hoodie over his head. Pt has flat affect, depressed mood and slow interaction. Pt is cooperative and pleasant to staff and others on the milieu. Pt compliant with medications and treatment. Pt reported that he cannot go live with his sister after d/c, "that's not an options".   A: Medications administered as ordered per MD. Verbal support given. Pt encouraged to attend groups. 15 minute checks performed for safety. Shift assessment performed.  R: Pt is safe. Depressed mood. Minimal interaction.

## 2011-12-18 NOTE — Progress Notes (Signed)
[place] LCSW Group Therapy  12/18/2011 2:54 PM  Type of Therapy:  Group Therapy  Participation Level:  Active  Participation Quality:  Appropriate and Attentive  Affect:  Appropriate  Cognitive:  Alert and Appropriate  Insight:  Good  Engagement in Therapy:  Engaged  Modes of Intervention:  Activity, Discussion, Problem-solving and Socialization  Summary of Progress/Problems: Today's group focused on overcoming obstacles by first identifying them, and then problem solving about what to do about them.  Dylan [name] shared that his main obstacle is taking meds outside of here.  He named challenges of running out, not going to follow up appointments, forgetting and feeling like he does not need them.  He also finally admitted that spending 10 years in prison makes it difficult to figure out how to take responsibility for self as everything was dictated for him for 10 years.  Finally, he also identified unstable living situation as another obstacle, saying that when he was released from prison, he was dropped off in front of the [facility] and told goo luck.  They had no beds.  He shared that he had recently seen on television that there are more shelters for animals than there are for humans.  Dylan [name] 12/18/2011, 2:54 PM

## 2011-12-18 NOTE — Progress Notes (Deleted)
Patient did attend the evening speaker AA meeting.  

## 2011-12-18 NOTE — Treatment Plan (Signed)
Interdisciplinary Treatment Plan Update (Adult)  Date: 12/18/2011  Time Reviewed: 10:30 AM   Progress in Treatment: Attending groups: Yes Participating in groups: Yes Taking medication as prescribed: Yes Tolerating medication: Yes   Family/Significant other contact made:   Patient understands diagnosis:  Yes Discussing patient identified problems/goals with staff:  Yes Medical problems stabilized or resolved:  Yes Denies suicidal/homicidal ideation: Yes  In tx team Issues/concerns per patient self-inventory:  Yes  States he slept poorly Other:  New problem(s) identified: N/A  Reason for Continuation of Hospitalization: Medication stabilization  Interventions implemented related to continuation of hospitalization: Increase sleep medication  Encourage group attendance and participation  Additional comments:  Estimated length of stay: 1-2 days  Discharge Plan: see below  New goal(s): Ryleigh is complaining of poor sleep  Review of initial/current patient goals per problem list:   1.  Goal(s):Eliminate SI  Met:  Yes  Target date:see previous tx plan  As evidenced by:  2.  Goal (s):Stabilize mood  Met:  Yes  Target date:12/9  As evidenced [id number] rates his depression and anxiety at 2's  3.  Goal(s):Eliminate psychosis  Met:  Yes  Target date:12/9  As evidenced [id number] states he no longer hears voices  4.  Goal(s):Identify comprehensive mental wellness and sobriety plan  Met:  Yes  Target date:12/9  As evidenced [id number] plans to [facility] the [facility] and follow up at [facility] before he gets into [place] rehab on 12/17  Attendees: Patient:  Dylan [name] 12/18/2011 10:31 AM   Family:     Physician:  Corena Pilgrim 12/18/2011 10:30 AM   Nursing: Darrol Angel RN   12/18/2011 10:30 AM   Clinical Social Worker:  Ripley Fraise 12/18/2011 10:30 AM   Extender:  Nena Polio PA 12/18/2011 10:30 AM   Other:     Other:     Other:     Other:      Scribe for  Treatment Team:   Roque Lias B, 12/18/2011 10:30 AM

## 2011-12-18 NOTE — Progress Notes (Signed)
Patient ID: Dylan [name], male   DOB: 1965/07/30, 46 y.o.   MRN: [id number] [facility] MD Progress Note  12/18/2011 10:50 AM Dylan [name]  MRN:  [id number] Subjective: " I did not sleep well last night and still feeling depressd" Diagnosis:  Axis I: Schizoaffective Disorder, depressed type                    Cocaine use disorder  ADL's:  Intact  Sleep: Fair  Appetite:  Fair  Suicidal Ideation: yes Plan:  denies  Intent:  denies Means:  denies Homicidal Ideation: no Plan:  denies Intent:  denies Means:  denies AEB (as evidenced by): Patient report hearing voices occasionally and difficulty sleeping. He denies suicidal ideations, visual hallucinations and he feels less depressed. Patient is compliant with his medications with no adverse reaction reported.   Psychiatric Specialty Exam: Review of Systems  Constitutional: Negative.   HENT: Negative.   Eyes: Negative.   Respiratory: Negative.   Cardiovascular: Negative.   Gastrointestinal: Negative.   Genitourinary: Negative.   Musculoskeletal: Negative for myalgias.  Skin: Negative.   Neurological: Negative.   Endo/Heme/Allergies: Negative.   Psychiatric/Behavioral: Positive for hallucinations. The patient has insomnia.     Blood pressure 128/62, pulse 83, temperature 98 F (36.7 C), temperature source Oral, resp. rate 20, height 6\' 1"  (1.854 m), weight 91.627 kg (202 lb).Body mass index is 26.65 kg/(m^2).  General Appearance: Casual and Fairly Groomed  Engineer, water::  Fair  Speech:  Clear and Coherent  Volume:  Decreased  Mood:  Depressed  Affect:  Restricted  Thought Process:  Coherent and Linear  Orientation:  Full (Time, Place, and Person)  Thought Content:  Delusions and Hallucinations: Auditory Visual  Suicidal Thoughts:  Yes.  without intent/plan  Homicidal Thoughts:  No  Memory:  Immediate;   Fair Recent;   Fair Remote;   Fair  Judgement:  Impaired  Insight:  Shallow  Psychomotor Activity:  Decreased   Concentration:  Fair  Recall:  Fair  Akathisia:  No  Handed:  Right  AIMS (if indicated):     Assets:  Communication Skills Desire for Improvement  Sleep:  Number of Hours: 6    Current Medications: Current Facility-Administered Medications  Medication Dose Route Frequency Provider Last Rate Last Dose  . acetaminophen (TYLENOL) tablet 650 mg  650 mg Oral Q6H PRN Laverle Hobby, PA      . albuterol (PROVENTIL HFA;VENTOLIN HFA) 108 (90 BASE) MCG/ACT inhaler 2 puff  2 puff Inhalation Q6H PRN Laverle Hobby, PA      . alum & mag hydroxide-simeth (MAALOX/MYLANTA) 200-200-20 MG/5ML suspension 30 mL  30 mL Oral Q4H PRN Laverle Hobby, PA      . atazanavir (REYATAZ) capsule 300 mg  300 mg Oral QPM Laverle Hobby, PA   300 mg at 12/17/11 1613  . benztropine (COGENTIN) tablet 1 mg  1 mg Oral BH-qamhs Keyston Ardolino   1 mg at 12/18/11 0806  . citalopram (CELEXA) tablet 40 mg  40 mg Oral Q breakfast Laverle Hobby, PA   40 mg at 12/18/11 0806  . emtricitabine-tenofovir (TRUVADA) 200-300 MG per tablet 1 tablet  1 tablet Oral QPM Laverle Hobby, PA   1 tablet at 12/17/11 1614  . magnesium hydroxide (MILK OF MAGNESIA) suspension 30 mL  30 mL Oral Daily PRN Laverle Hobby, PA   30 mL at 12/16/11 0550  . naproxen (NAPROSYN) tablet 375 mg  375 mg Oral  BID WC Laverle Hobby, PA   375 mg at 12/18/11 0806  . nicotine polacrilex (NICORETTE) gum 2 mg  2 mg Oral PRN Ellory Khurana   2 mg at 12/18/11 0926  . risperiDONE (RISPERDAL M-TABS) disintegrating tablet 4 mg  4 mg Oral QHS Timarion Agcaoili      . ritonavir (NORVIR) capsule 100 mg  100 mg Oral QHS Joane Postel   100 mg at 12/17/11 1614  . traZODone (DESYREL) tablet 200 mg  200 mg Oral QHS Kameron Glazebrook      . [DISCONTINUED] risperiDONE (RISPERDAL M-TABS) disintegrating tablet 3 mg  3 mg Oral QHS Tesla Keeler   3 mg at 12/17/11 2204  . [DISCONTINUED] traZODone (DESYREL) tablet 150 mg  150 mg Oral QHS,MR X 1 Raiford Simmonds, MD   150 mg at  12/17/11 2204    Lab Results:  No results found for this or any previous visit (from the past 48 hour(s)).  Physical Findings: AIMS: Facial and Oral Movements Muscles of Facial Expression: None, normal Lips and Perioral Area: None, normal Jaw: None, normal Tongue: None, normal,Extremity Movements Upper (arms, wrists, hands, fingers): None, normal Lower (legs, knees, ankles, toes): None, normal, Trunk Movements Neck, shoulders, hips: None, normal, Overall Severity Severity of abnormal movements (highest score from questions above): None, normal Incapacitation due to abnormal movements: None, normal Patient's awareness of abnormal movements (rate only patient's report): No Awareness, Dental Status Current problems with teeth and/or dentures?: No Does patient usually wear dentures?: No  CIWA:  CIWA-Ar Total: 0  COWS:  COWS Total Score: 1   Treatment Plan Summary: Daily contact with patient to assess and evaluate symptoms and progress in treatment Medication management  Plan: 1. I will increase Risperdal M-TAB to 4mg  daily at bedtime for psychosis. 2. I will continue Celexa 40mg  daily for depression 3. I will continue Benztropin 1mg  twice daily for EPD prevention 4. CM will continue to work on disposition 5. Patient encourage to attend group therapy and other [place].  Medical Decision Making Problem Points:  Established problem, stable/improving (1), Review of last therapy session (1), Review of psycho-social stressors (1) and Self-limited or minor (1) Data Points:  Decision to obtain old records (1) Order Aims Assessment (2) Review or order clinical lab tests (1) Review of medication regiment & side effects (2)  I certify that inpatient services furnished can reasonably be expected to improve the patient's condition.   Corena Pilgrim, MD 12/18/2011, 10:50 AM

## 2011-12-19 MED ORDER — TRAZODONE HCL 100 MG PO TABS: 200.0000 mg | ORAL_TABLET | Freq: Every day | ORAL | Status: DC

## 2011-12-19 MED ORDER — BENZTROPINE MESYLATE 1 MG PO TABS: 1.0000 mg | ORAL_TABLET | ORAL | Status: DC

## 2011-12-19 MED ORDER — CITALOPRAM HYDROBROMIDE 40 MG PO TABS: 40.0000 mg | ORAL_TABLET | Freq: Every day | ORAL | Status: DC

## 2011-12-19 MED ORDER — RISPERIDONE 4 MG PO TBDP: 4.0000 mg | ORAL_TABLET | Freq: Every day | ORAL | Status: DC

## 2011-12-19 NOTE — [facility] Suicide Risk Assessment (Signed)
Suicide Risk Assessment  Discharge Assessment     Demographic Factors:  Male  Mental Status Per Nursing Assessment::   On Admission:  Suicidal ideation indicated by patient;Suicide plan;Self-harm thoughts;Intention to act on suicide plan  Current Mental Status by Physician: patient currently denies suicidal ideations, intent and plan.  Loss Factors: Decrease in vocational status and Financial problems/change in socioeconomic status  Historical Factors: not reported by the patient.  Risk Reduction Factors:   Positive social support and Positive therapeutic relationship  Continued Clinical Symptoms:  Resolving depressive symptoms.  Cognitive Features That Contribute To Risk:  Closed-mindedness Thought constriction (tunnel vision)    Suicide Risk:  Minimal: No identifiable suicidal ideation.  Patients presenting with no risk factors but with morbid ruminations; may be classified as minimal risk based on the severity of the depressive symptoms  Discharge Diagnoses:   AXIS I:  Schizoaffective Disorder. Cocaine use disorder AXIS II:  Deferred AXIS III:   Past Medical History  Diagnosis Date  . Human immunodeficiency virus (HIV)   . Hepatitis B   . Arthritis    AXIS IV:  economic problems, housing problems, other psychosocial or environmental problems, problems related to legal system/crime and problems related to social environment AXIS V:  61-70 mild symptoms  Plan Of Care/Follow-up recommendations:  Activity:  as tolerated Diet:  healthy Tests:  routine Other:  patient to keep after care appointment and takes his medications as recommended.  Is patient on multiple antipsychotic therapies at discharge:  No   Has Patient had three or more failed trials of antipsychotic monotherapy by history:  No  Recommended Plan for Multiple Antipsychotic Therapies: N/A  Corena Pilgrim, MD 12/19/2011, 10:13 AM

## 2011-12-19 NOTE — Progress Notes (Signed)
Discharged Note: pt discharged to self. Pt received both verbal and written discharged instructions. Pt agreed to f/u treatment and medication regimen. Pt denies SI/HI/AVH. Pt denies pain and show no s/s of distress. Pt received all belongings from room and locker. Pt was given sample meds, bus pass and directions to shelter. Pt was cooperative and pleasant during session.

## 2011-12-19 NOTE — Discharge Summary (Signed)
Physician Discharge Summary Note  Patient:  Dylan [name] is an 46 y.o., male MRN:  [id number] DOB:  03/28/1965 Patient phone:  [phone] (home)  Patient address:   [address] [place] [address],   Date of Admission:  12/12/2011 Date of Discharge: 12/19/2011  Reason for Admission:  Psychosis Discharge Diagnoses: Principal Problem:  *Schizoaffective disorder, depressive type Discharge Diagnoses:  AXIS I: Schizoaffective Disorder. Cocaine use disorder  AXIS II: Deferred  AXIS III:  Past Medical History   Diagnosis  Date   .  Human immunodeficiency virus (HIV)    .  Hepatitis B    .  Arthritis    AXIS IV: economic problems, housing problems, other psychosocial or environmental problems, problems related to legal system/crime and problems related to social environment  AXIS V: 61-70 mild symptoms  Review of Systems  Constitutional: Negative.  Negative for fever, chills, weight loss, malaise/fatigue and diaphoresis.  HENT: Negative for congestion and sore throat.   Eyes: Negative for blurred vision, double vision and photophobia.  Respiratory: Negative for cough, shortness of breath and wheezing.   Cardiovascular: Negative for chest pain, palpitations and PND.  Gastrointestinal: Negative for heartburn, nausea, vomiting, abdominal pain, diarrhea and constipation.  Musculoskeletal: Negative for myalgias, joint pain and falls.  Neurological: Negative for dizziness, tingling, tremors, sensory change, speech change, focal weakness, seizures, loss of consciousness, weakness and headaches.  Endo/Heme/Allergies: Negative for polydipsia. Does not bruise/bleed easily.  Psychiatric/Behavioral: Negative for depression, suicidal ideas, hallucinations, memory loss and substance abuse. The patient is not nervous/anxious and does not have insomnia.    Level of Care:  OP  Hospital Course:  Dylan [name] was voluntarily admitted for stabilization and crisis management when he reported  to the ED stating he had suicidal ideation. He was given medical clearance and transferred to [facility].  He reported his symptoms of auditory hallucinations, depression, delusions, guilt, hopelessness, anhedonia, suicidal ideation,paranoia, and command hallucinations to harm himself.     Dylan [name] was just released from jail 16 days prior to admission and reported that he had been off of his medications. He has a long history of mental illness and is currently unemployed, single and a registered sex offender.  He is currently on probation for one year.  He is HIV +, and has a history of schizoaffective disorder, non-compliance and cocaine abuse.      His symptoms were treated with Risperdal M tabs titrated to 4mg , Celexa 40mg  for depression and Cogenting for any EPS.  He was evaluated daily by clinical providers and his response to treatment was rapid. He reported a reduction in symptoms and felt stable enough for discharge with in 72 hours. On the day of discharge he was in much improved condition, reported no SI/HI, denied AVH and was in full contact with reality.  Consults:  None  Significant Diagnostic Studies:  None  Discharge Vitals:   Blood pressure 119/73, pulse 72, temperature 97 F (36.1 C), temperature source Oral, resp. rate 20, height 6\' 1"  (1.854 m), weight 91.627 kg (202 lb). Body mass index is 26.65 kg/(m^2). Lab Results:   No results found for this or any previous visit (from the past 72 hour(s)).  Physical Findings: AIMS: Facial and Oral Movements Muscles of Facial Expression: None, normal Lips and Perioral Area: None, normal Jaw: None, normal Tongue: None, normal,Extremity Movements Upper (arms, wrists, hands, fingers): None, normal Lower (legs, knees, ankles, toes): None, normal, Trunk Movements Neck, shoulders, hips: None, normal, Overall Severity Severity of abnormal  movements (highest score from questions above): None, normal Incapacitation due to abnormal movements: None,  normal Patient's awareness of abnormal movements (rate only patient's report): No Awareness, Dental Status Current problems with teeth and/or dentures?: No Does patient usually wear dentures?: No  CIWA:  CIWA-Ar Total: 0  COWS:  COWS Total Score: 1   Psychiatric Specialty Exam: See Psychiatric Specialty Exam and Suicide Risk Assessment completed by Attending Physician prior to discharge.  Discharge destination:  Home  Is patient on multiple antipsychotic therapies at discharge:  No   Has Patient had three or more failed trials of antipsychotic monotherapy by history:  No  Recommended Plan for Multiple Antipsychotic Therapies: not applicable   Discharge Orders    Future Appointments: Provider: Department: Dept Phone: Center:   02/08/2012 9:00 AM [facility] [phone] [facility]   02/22/2012 8:45 AM Carlyle Basques, MD [facility] [phone] [facility]     Future Orders Please Complete By Expires   Diet - low sodium heart healthy      Increase activity slowly      Discharge instructions      Comments:   Take all of your medications as prescribed.  Be sure to keep ALL follow up appointments as scheduled. This is to ensure getting your refills on time to avoid any interruption in your medication.  If you find that you can not keep your appointment, call and reschedule. Be sure to tell the nurse if you will need a refill before your appointment.       Medication List     As of 12/19/2011  1:02 PM    STOP taking these medications         SEROQUEL XR 150 MG 24 hr tablet   Generic drug: QUEtiapine Fumarate      TAKE these medications      Indication    albuterol 108 (90 BASE) MCG/ACT inhaler   Commonly known as: PROVENTIL HFA;VENTOLIN HFA   Inhale 2 puffs into the lungs every 6 (six) hours as needed for wheezing or shortness of breath.       atazanavir 300 MG capsule   Commonly known as: REYATAZ    Take 300 mg by mouth every evening. For viral infection.       benztropine 1 MG tablet   Commonly known as: COGENTIN   Take 1 tablet (1 mg total) by mouth 2 (two) times daily in the am and at bedtime.. For EPS.    Indication: Extrapyramidal Reaction caused by Medications      citalopram 40 MG tablet   Commonly known as: CELEXA   Take 1 tablet (40 mg total) by mouth daily with breakfast. For depression.    Indication: Depression      emtricitabine-tenofovir 200-300 MG per tablet   Commonly known as: TRUVADA   Take 1 tablet by mouth every evening. For viral infection.       naproxen 375 MG tablet   Commonly known as: NAPROSYN   Take 1 tablet (375 mg total) by mouth 2 (two) times daily with a meal. For back pain.       risperiDONE 4 MG disintegrating tablet   Commonly known as: RISPERDAL M-TABS   Take 1 tablet (4 mg total) by mouth at bedtime. For psychosis.    Indication: Schizophrenia      ritonavir 100 MG capsule   Commonly known as: NORVIR   Take 100 mg by mouth at  bedtime. For viral infection.       traZODone 100 MG tablet   Commonly known as: DESYREL   Take 2 tablets (200 mg total) by mouth at bedtime. For insomnia.    Indication: Trouble Sleeping           Follow-up Information    Follow up with [facility]. On 12/26/2011. (Arrive at [address] [facility].  Bring a supply of meds  Call Bonney Leitz at [phone] this week to confirm that you still want the bed, that you are staying at the shleter, and that you need them to pick you up on Tues. AM)    Contact information:   [place] [phone]      Follow up with [facility]. On 12/20/2011. (10:30AM  This is where you will see a Dr for your medication.  Make sure you ask for a supply of medication as you will need to take at least a week's worth of medication at [facility].)    Contact information:   [place] [phone]      Follow up with [facility]. (This is the place I was  telling you about that has substance abuse groups every day at 9:00 run by Aaron Edelman.  )    Contact information:   [address]  [phone]         Follow-up recommendations:  As directed above  Comments:    Total Discharge Time:  >30 minutes  Signed:  Milta Deiters T. Taira Knabe PAC 12/19/2011, 1:02 PM

## 2011-12-19 NOTE — Progress Notes (Signed)
[place] Group Notes:  (Counselor/Nursing/MHT/Case Management/Adjunct)  12/19/2011 6:57 PM  Type of Therapy:  Discharge Planning  Participation Level:  Minimal  Participation Quality:  Sharing  Affect:  Flat, drowsy  Cognitive:  Oriented  Insight:  Limited  Engagement in Group:  Limited  Engagement in Therapy:  Limited  Modes of Intervention:  Exploration, Socialization and Support  Summary of Progress/Problems:  Quienton shared that he doesn't feel he is getting enough sleep as he wakes really early.  He is anticipating December 17th admit to [facility] but remains uncertain as to where to be meantime.  Pt agreed to discuss with peers and CSW if needed.    Lyla Glassing 12/19/2011, 6:57 PM

## 2011-12-19 NOTE — Progress Notes (Signed)
[facility] Group Notes:  (Counselor/Nursing/MHT/Case Management/Adjunct)  12/19/2011 6:45 PM  Type of Therapy:  Group Therapy at 11:00AM  Participation Level:  Active  Participation Quality:  Appropriate and Inattentive  Affect:  Flat  Cognitive:  Oriented  Insight:  Limited  Engagement in Group:  Limited  Engagement in Therapy:  Limited  Modes of Intervention:  Clarification, Orientation and Socialization  Summary of Progress/Problems:  Group activity involved group members choosing a question to ask from the "ungame" and later sharing answer to "What is one thing you would change in your life?" Dylan [name] shared that he could be a better husband and shared that that one change would improve his life greatly.    Dylan [name] 12/19/2011, 6:45 PM

## 2011-12-19 NOTE — Progress Notes (Signed)
Psychoeducational Group Note  Date:  12/19/2011 Time:  0930  Group Topic/Focus:  Recovery Goals:   The focus of this group is to identify appropriate goals for recovery and establish a plan to achieve them.  Participation Level:  Active  Participation Quality:  Appropriate  Affect:  Appropriate  Cognitive:  Appropriate  Insight:  Engaged  Engagement in Group:  Engaged  Additional Comments:  Pt attended group and shared positively.  Clark Clowdus E 12/19/2011, 1:42 PM

## 2011-12-19 NOTE — Progress Notes (Signed)
[facility] Adult Case Management Discharge Plan :  Will you be returning to the same living situation after discharge: No. At discharge, do you have transportation home?:Yes,  bus pass Do you have the ability to pay for your medications:Yes,  mental health  Release of information consent forms completed and in the chart;  Patient's signature needed at discharge.  Patient to Follow up at: Follow-up Information    Follow up with [facility]. On 12/26/2011. (Arrive at [address].  Bring a supply of meds  Call Bonney Leitz at [phone] this week to confirm that you still want the bed, that you are staying at the shleter, and that you need them to pick you up on Tues. AM)    Contact information:   [place] [phone]      Follow up with [facility]. On 12/20/2011. (10:30AM  This is where you will see a Dr for your medication.  Make sure you ask for a supply of medication as you will need to take at least a week's worth of medication at [facility].)    Contact information:   [place] [phone]      Follow up with [facility]. (This is the place I was telling you about that has substance abuse groups every day at 9:00 run by Aaron Edelman.  )    Contact information:   [address]  [[phone]         Patient denies SI/HI:   Yes,  Yes    Safety Planning and Suicide Prevention discussed:  Yes,  Yes  Roque Lias B 12/19/2011, 11:17 AM

## 2011-12-26 NOTE — Discharge Summary (Signed)
Seen and agreed. Philomene Haff, MD 

## 2012-02-01 ENCOUNTER — Other Ambulatory Visit: Payer: Self-pay

## 2012-02-08 ENCOUNTER — Emergency Department (HOSPITAL_COMMUNITY)
Admission: EM | Admit: 2012-02-08 | Discharge: 2012-02-08 | Disposition: A | Discharge status: Home / Self Care | Payer: Medicaid Other | Attending: Emergency Medicine | Admitting: Emergency Medicine

## 2012-02-08 ENCOUNTER — Encounter (HOSPITAL_COMMUNITY): Payer: Self-pay | Admitting: *Deleted

## 2012-02-08 ENCOUNTER — Other Ambulatory Visit (INDEPENDENT_AMBULATORY_CARE_PROVIDER_SITE_OTHER): Payer: Medicaid Other

## 2012-02-08 DIAGNOSIS — Z113 Encounter for screening for infections with a predominantly sexual mode of transmission: Secondary | ICD-10-CM

## 2012-02-08 DIAGNOSIS — Z79899 Other long term (current) drug therapy: Secondary | ICD-10-CM | POA: Insufficient documentation

## 2012-02-08 DIAGNOSIS — F17200 Nicotine dependence, unspecified, uncomplicated: Secondary | ICD-10-CM | POA: Insufficient documentation

## 2012-02-08 DIAGNOSIS — Z21 Asymptomatic human immunodeficiency virus [HIV] infection status: Secondary | ICD-10-CM | POA: Insufficient documentation

## 2012-02-08 DIAGNOSIS — M5416 Radiculopathy, lumbar region: Secondary | ICD-10-CM

## 2012-02-08 DIAGNOSIS — F3289 Other specified depressive episodes: Secondary | ICD-10-CM | POA: Insufficient documentation

## 2012-02-08 DIAGNOSIS — IMO0002 Reserved for concepts with insufficient information to code with codable children: Secondary | ICD-10-CM | POA: Insufficient documentation

## 2012-02-08 DIAGNOSIS — F329 Major depressive disorder, single episode, unspecified: Secondary | ICD-10-CM | POA: Insufficient documentation

## 2012-02-08 DIAGNOSIS — Z8739 Personal history of other diseases of the musculoskeletal system and connective tissue: Secondary | ICD-10-CM | POA: Insufficient documentation

## 2012-02-08 DIAGNOSIS — F319 Bipolar disorder, unspecified: Secondary | ICD-10-CM | POA: Insufficient documentation

## 2012-02-08 DIAGNOSIS — B20 Human immunodeficiency virus [HIV] disease: Secondary | ICD-10-CM

## 2012-02-08 DIAGNOSIS — Z8619 Personal history of other infectious and parasitic diseases: Secondary | ICD-10-CM | POA: Insufficient documentation

## 2012-02-08 DIAGNOSIS — F209 Schizophrenia, unspecified: Secondary | ICD-10-CM | POA: Insufficient documentation

## 2012-02-08 LAB — COMPREHENSIVE METABOLIC PANEL
ALT: 28 U/L (ref 0–53)
AST: 21 U/L (ref 0–37)
BUN: 13 mg/dL (ref 6–23)
CO2: 29 mEq/L (ref 19–32)
Calcium: 9.5 mg/dL (ref 8.4–10.5)
Creat: 1.06 mg/dL (ref 0.50–1.35)
Total Bilirubin: 1.2 mg/dL (ref 0.3–1.2)

## 2012-02-08 LAB — CBC WITH DIFFERENTIAL/PLATELET
Basophils Absolute: 0 10*3/uL (ref 0.0–0.1)
Eosinophils Absolute: 0.2 10*3/uL (ref 0.0–0.7)
Eosinophils Relative: 3 % (ref 0–5)
HCT: 43.4 % (ref 39.0–52.0)
MCH: 31.1 pg (ref 26.0–34.0)
MCV: 89.3 fL (ref 78.0–100.0)
Monocytes Absolute: 0.5 10*3/uL (ref 0.1–1.0)
Platelets: 211 10*3/uL (ref 150–400)
RDW: 13.9 % (ref 11.5–15.5)

## 2012-02-08 MED ORDER — METHOCARBAMOL 500 MG PO TABS: 500.0000 mg | ORAL_TABLET | Freq: Two times a day (BID) | ORAL | Status: DC

## 2012-02-08 MED ORDER — NAPROXEN 500 MG PO TABS: 500.0000 mg | ORAL_TABLET | Freq: Two times a day (BID) | ORAL | Status: DC

## 2012-02-08 MED ORDER — HYDROCODONE-ACETAMINOPHEN 5-325 MG PO TABS: 1.0000 | ORAL_TABLET | Freq: Four times a day (QID) | ORAL | Status: DC | PRN

## 2012-02-08 NOTE — ED Notes (Signed)
Pt with hx of degenerative disc disease is here for increasing back pain.  Pt states that today the pain has been worse.  No new injury or trauma.  Pt has also been having intermittent weakness in his legs for a year.  Today while outside in the cold his legs gave out (this has been occuring intermittently for a year).  Pt states that he is incontinent intermittently and this has been going on for over a year, no incontinence today. Pt is ambulatory in triage.

## 2012-02-08 NOTE — ED Provider Notes (Signed)
History    Scribed for Saddie Benders. Ghim, MD, the patient was seen in room TR09C/TR09C . This chart was scribed by Denice Bors.  CSN: [id number]  Arrival date & time 02/08/12  1444   First MD Initiated Contact with Patient 02/08/12 1610      Chief Complaint  Patient presents with  . Back Pain    (Consider location/radiation/quality/duration/timing/severity/associated sxs/prior treatment) HPI Dylan [name] is a 47 y.o. male who presents to the Emergency Department complaining of intermittent worsening left buttock pain radiating down left leg onset 15 years. Pt reports his pain is predominantly left-sided and "left leg just gave out today". Pt reports pain is increasing in frequency and severity. Pt reports pain increases when walking up and down steps. Pt reports pain is improved by nothing. Pt denies new injury or trauma. Pt denies numbness or tingling. Pt reports he ambulates well but slower due to pain. Pt reports stress incontinence with urination only. Pt reports hx of degenerative disc disease and back pain.   Past Medical History  Diagnosis Date  . Human immunodeficiency virus (HIV)   . Hepatitis B   . Schizophrenia   . Bipolar 1 disorder   . Arthritis   . Depression     Past Surgical History  Procedure Date  . Tumor removal     From Chest    No family history on file.  History  Substance Use Topics  . Smoking status: Current Every Day Smoker -- 0.4 packs/day for 15 years  . Smokeless tobacco: Current User    Types: Snuff  . Alcohol Use: 21.6 oz/week    36 Cans of beer per week     Comment: past      Review of Systems  Constitutional: Negative.   HENT: Negative.   Respiratory: Negative.   Cardiovascular: Negative.   Gastrointestinal: Negative.   Musculoskeletal: Positive for back pain.  Skin: Negative.   Neurological: Negative.   Hematological: Negative.   Psychiatric/Behavioral: Negative.   All other systems reviewed and are negative.  A  complete 10 system review of systems was obtained and all systems are negative except as noted in the HPI and PMH.    Allergies  Penicillins; Latex; and Tape  Home Medications   Current Outpatient Rx  Name  Route  Sig  Dispense  Refill  . ALBUTEROL SULFATE HFA 108 (90 BASE) MCG/ACT IN AERS   Inhalation   Inhale 2 puffs into the lungs every 6 (six) hours as needed for wheezing or shortness of breath.   8.5 g   0   . ATAZANAVIR SULFATE 300 MG PO CAPS   Oral   Take 300 mg by mouth every evening. For viral infection.         Marland Kitchen BENZTROPINE MESYLATE 1 MG PO TABS   Oral   Take 1 tablet (1 mg total) by mouth 2 (two) times daily in the am and at bedtime.. For EPS.   60 tablet   0   . CITALOPRAM HYDROBROMIDE 40 MG PO TABS   Oral   Take 1 tablet (40 mg total) by mouth daily with breakfast. For depression.   30 tablet   0   . QUETIAPINE FUMARATE ER 50 MG PO TB24   Oral   Take 100 mg by mouth at bedtime.         Marland Kitchen RITONAVIR 100 MG PO CAPS   Oral   Take 100 mg by mouth at bedtime. For viral infection.  BP 136/84  Pulse 86  Temp 97.8 F (36.6 C) (Oral)  Resp 20  Ht 6\' 1"  (1.854 m)  Wt 210 lb (95.255 kg)  BMI 27.71 kg/m2  SpO2 98%  Physical Exam  Nursing note and vitals reviewed. Constitutional: He is oriented to person, place, and time. He appears well-developed and well-nourished.  Non-toxic appearance. He does not appear ill. No distress.  HENT:  Head: Normocephalic and atraumatic.  Right Ear: External ear normal.  Left Ear: External ear normal.  Nose: Nose normal. No mucosal edema or rhinorrhea.  Mouth/Throat: Oropharynx is clear and moist and mucous membranes are normal. No dental abscesses or uvula swelling. No oropharyngeal exudate.  Eyes: Conjunctivae normal and EOM are normal. Pupils are equal, round, and reactive to light.  Neck: Normal range of motion and full passive range of motion without pain. Neck supple.       Full ROM without pain   Cardiovascular: Normal rate, regular rhythm, normal heart sounds and intact distal pulses.  Exam reveals no gallop and no friction rub.   No murmur heard. Pulmonary/Chest: Effort normal and breath sounds normal. No respiratory distress. He has no wheezes. He has no rhonchi. He has no rales. He exhibits no tenderness and no crepitus.  Abdominal: Soft. Normal appearance and bowel sounds are normal. He exhibits no distension. There is no tenderness. There is no rebound and no guarding.  Musculoskeletal: Normal range of motion. He exhibits no edema and no tenderness.       Moves all extremities well.   Lymphadenopathy:    He has no cervical adenopathy.  Neurological: He is alert and oriented to person, place, and time. He has normal strength. No cranial nerve deficit.       Speech is clear and goal oriented, follows commands Normal strength in upper and lower extremities bilaterally including dorsiflexion and plantar flexion, strong and equal grip strength Sensation normal to light and sharp touch Moves extremities without ataxia, coordination intact Normal gait Normal balance   Skin: Skin is warm, dry and intact. No rash noted. He is not diaphoretic. No erythema. No pallor.  Psychiatric: He has a normal mood and affect. His speech is normal and behavior is normal. His mood appears not anxious.    ED Course  Procedures (including critical care time)  Labs Reviewed - No data to display No results found.   No diagnosis found.  History of DDD, frequent bouts of sciatica.  Rx for NSAID, muscle relaxant.   MDM         I personally performed the services described in this documentation, which was scribed in my presence. The recorded information has been reviewed and is accurate.   Norman Herrlich, NP 02/09/12 [phone]

## 2012-02-09 LAB — HIV-1 RNA QUANT-NO REFLEX-BLD: HIV 1 RNA Quant: 107 copies/mL — ABNORMAL HIGH (ref <20)

## 2012-02-09 LAB — T-HELPER CELL (CD4) - (RCID CLINIC ONLY)
CD4 % Helper T Cell: 24 % — ABNORMAL LOW (ref 33–55)
CD4 T Cell Abs: 600 uL (ref 400–2700)

## 2012-02-11 NOTE — ED Provider Notes (Signed)
Medical screening examination/treatment/procedure(s) were performed by non-physician practitioner and as supervising physician I was immediately available for consultation/collaboration.   Saddie Benders. Kahlie Deutscher, MD 02/11/12 1630

## 2012-02-22 ENCOUNTER — Ambulatory Visit: Payer: Self-pay | Admitting: Internal Medicine

## 2012-02-29 ENCOUNTER — Ambulatory Visit: Payer: Self-pay | Admitting: Internal Medicine

## 2012-03-26 ENCOUNTER — Encounter: Payer: Self-pay | Admitting: Internal Medicine

## 2012-03-26 ENCOUNTER — Ambulatory Visit (INDEPENDENT_AMBULATORY_CARE_PROVIDER_SITE_OTHER): Payer: Medicaid Other | Admitting: Internal Medicine

## 2012-03-26 VITALS — BP 133/82 | HR 69 | Temp 97.9°F | Ht 75.0 in | Wt 217.0 lb

## 2012-03-26 DIAGNOSIS — Z0000 Encounter for general adult medical examination without abnormal findings: Secondary | ICD-10-CM

## 2012-03-26 DIAGNOSIS — M545 Low back pain, unspecified: Secondary | ICD-10-CM

## 2012-03-26 DIAGNOSIS — Z23 Encounter for immunization: Secondary | ICD-10-CM

## 2012-03-26 DIAGNOSIS — B20 Human immunodeficiency virus [HIV] disease: Secondary | ICD-10-CM

## 2012-03-26 DIAGNOSIS — B1910 Unspecified viral hepatitis B without hepatic coma: Secondary | ICD-10-CM

## 2012-03-26 LAB — BASIC METABOLIC PANEL WITH GFR
CO2: 25 mEq/L (ref 19–32)
Calcium: 9.5 mg/dL (ref 8.4–10.5)
Chloride: 103 mEq/L (ref 96–112)
Glucose, Bld: 91 mg/dL (ref 70–99)
Sodium: 135 mEq/L (ref 135–145)

## 2012-03-26 NOTE — Progress Notes (Signed)
[facility] CLIN[facility] NOTE  RFV:  Routine care ? Last seen in Oct Subjective:    Patient ID: Dylan [name], male    DOB: Nov 28, 1965, 47 y.o.   MRN: [id number]  HPI 47 yo Male with  HIV/HBV co-infection in addition to schizoaffective d/o, currently on truvada/ATVr,    Hx of ddd L4-L5 area. Also has element of sciatica, radiating down left leg. He notices pain worse when bending over, back "goes out" when in the process of sitting down.   Review of Systems     Objective:   Physical Exam BP 133/82  Pulse 69  Temp(Src) 97.9 F (36.6 C) (Oral)  Ht 6\' 3"  (1.905 m)  Wt 217 lb (98.431 kg)  BMI 27.12 kg/m2 Physical Exam  Constitutional: He is oriented to person, place, and time. He appears well-developed and well-nourished. No distress.  HENT:  Mouth/Throat: Oropharynx is clear and moist. No oropharyngeal exudate.  Cardiovascular: Normal rate, regular rhythm and normal heart sounds. Exam reveals no gallop and no friction rub.  No murmur heard.  Pulmonary/Chest: Effort normal and breath sounds normal. No respiratory distress. He has no wheezes.  Abdominal: Soft. Bowel sounds are normal. He exhibits no distension. There is no tenderness.  Lymphadenopathy:  He has no cervical adenopathy.  Neurological: He is alert and oriented to person, place, and time.  Skin: Skin is warm and dry. No rash noted. No erythema.  Psychiatric: He has a normal mood and affect. His behavior is normal.          Assessment & Plan:  HIV = continue on current ART regimen, recheck numbers today  HBV= will check HB VL  Health promotion = will give pneumococcal vaccine today  Schizoaffective disorder = xx  Low back pain = will CT scan, back pain appears to impede basic movements and ADLs. Has attempted to wear back brace without difficulty. Still continue to take narpoxyn.   [facility]-6wk.

## 2012-03-27 LAB — HIV-1 RNA QUANT-NO REFLEX-BLD: HIV-1 RNA Quant, Log: 1.36 {Log} — ABNORMAL HIGH (ref <1.30)

## 2012-03-28 LAB — HEPATITIS B DNA, ULTRAQUANTITATIVE, PCR: Hepatitis B DNA: NOT DETECTED IU/mL (ref <20)

## 2012-04-01 NOTE — Progress Notes (Signed)
HPI: Dylan [name] is a 47 y.o. male with HIV who is here for his follow up visit.   Allergies: Allergies  Allergen Reactions  . Penicillins Other (See Comments)    Childhood allergy  . Latex Rash  . Tape Rash    Vitals:    Past Medical History: Past Medical History  Diagnosis Date  . Human immunodeficiency virus (HIV)   . Hepatitis B   . Schizophrenia   . Bipolar 1 disorder   . Arthritis   . Depression     Social History: History   Social History  . Marital Status: Married    Spouse Name: N/A    Number of Children: N/A  . Years of Education: N/A   Social History Main Topics  . Smoking status: Current Every Day Smoker -- 0.40 packs/day for 15 years  . Smokeless tobacco: Current User    Types: Snuff  . Alcohol Use: No     Comment: past  . Drug Use: No     Comment: past  . Sexually Active: No   Other Topics Concern  . None   Social History Narrative  . None    Current Regimen: ATV/r + Truvada  Labs: HIV 1 RNA Quant (copies/mL)  Date Value  03/26/2012 23*  02/08/2012 107*  11/09/2011 5918*     CD4 T Cell Abs (cmm)  Date Value  02/08/2012 600   11/09/2011 500   08/18/2011 560      Hep B S Ab (no units)  Date Value  08/18/2011 NEG      Hepatitis B Surface Ag (no units)  Date Value  08/18/2011 POSITIVE*     HCV Ab (no units)  Date Value  08/18/2011 NEGATIVE     CrCl: Estimated Creatinine Clearance: 131.3 ml/min (by C-G formula based on Cr of 0.84).  Lipids:    Component Value Date/Time   CHOL 210* 12/13/2011 0620   TRIG 172* 12/13/2011 0620   HDL 38* 12/13/2011 0620   CHOLHDL 5.5 12/13/2011 0620   VLDL 34 12/13/2011 0620   LDLCALC 138* 12/13/2011 0620    Assessment: Pt is here for follow up visit. His VL looks good and CD4 is high. He had a previous bump in his VL in Oct. He was off of therapy during that time after being released from prison. Since then he is doing really well and compliance has been good. 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  Recommendations: Cont ATV/r + Truvada  Wilfred Lacy, PharmD Clinical Infectious [facility] 04/01/2012, 11:38 PM

## 2012-04-11 ENCOUNTER — Emergency Department (HOSPITAL_COMMUNITY): Payer: Medicaid Other

## 2012-04-11 ENCOUNTER — Encounter (HOSPITAL_COMMUNITY): Payer: Self-pay | Admitting: Emergency Medicine

## 2012-04-11 ENCOUNTER — Emergency Department (HOSPITAL_COMMUNITY)
Admission: EM | Admit: 2012-04-11 | Discharge: 2012-04-12 | Disposition: A | Discharge status: Other Acute Inpatient Hospital | Payer: Medicaid Other | Attending: Emergency Medicine | Admitting: Emergency Medicine

## 2012-04-11 DIAGNOSIS — R059 Cough, unspecified: Secondary | ICD-10-CM | POA: Insufficient documentation

## 2012-04-11 DIAGNOSIS — T43501A Poisoning by unspecified antipsychotics and neuroleptics, accidental (unintentional), initial encounter: Secondary | ICD-10-CM | POA: Insufficient documentation

## 2012-04-11 DIAGNOSIS — F17200 Nicotine dependence, unspecified, uncomplicated: Secondary | ICD-10-CM | POA: Insufficient documentation

## 2012-04-11 DIAGNOSIS — T43502A Poisoning by unspecified antipsychotics and neuroleptics, intentional self-harm, initial encounter: Secondary | ICD-10-CM | POA: Insufficient documentation

## 2012-04-11 DIAGNOSIS — Z8619 Personal history of other infectious and parasitic diseases: Secondary | ICD-10-CM | POA: Insufficient documentation

## 2012-04-11 DIAGNOSIS — R05 Cough: Secondary | ICD-10-CM | POA: Insufficient documentation

## 2012-04-11 DIAGNOSIS — F209 Schizophrenia, unspecified: Secondary | ICD-10-CM | POA: Insufficient documentation

## 2012-04-11 DIAGNOSIS — Z21 Asymptomatic human immunodeficiency virus [HIV] infection status: Secondary | ICD-10-CM | POA: Insufficient documentation

## 2012-04-11 DIAGNOSIS — Z79899 Other long term (current) drug therapy: Secondary | ICD-10-CM | POA: Insufficient documentation

## 2012-04-11 DIAGNOSIS — R0789 Other chest pain: Secondary | ICD-10-CM | POA: Insufficient documentation

## 2012-04-11 DIAGNOSIS — Z8659 Personal history of other mental and behavioral disorders: Secondary | ICD-10-CM | POA: Insufficient documentation

## 2012-04-11 DIAGNOSIS — F1210 Cannabis abuse, uncomplicated: Secondary | ICD-10-CM | POA: Insufficient documentation

## 2012-04-11 DIAGNOSIS — F1410 Cocaine abuse, uncomplicated: Secondary | ICD-10-CM | POA: Insufficient documentation

## 2012-04-11 DIAGNOSIS — F319 Bipolar disorder, unspecified: Secondary | ICD-10-CM | POA: Insufficient documentation

## 2012-04-11 DIAGNOSIS — T438X2A Poisoning by other psychotropic drugs, intentional self-harm, initial encounter: Secondary | ICD-10-CM | POA: Insufficient documentation

## 2012-04-11 LAB — CBC WITH DIFFERENTIAL/PLATELET
Basophils Absolute: 0 10*3/uL (ref 0.0–0.1)
Eosinophils Absolute: 0.3 10*3/uL (ref 0.0–0.7)
Eosinophils Relative: 3 % (ref 0–5)
MCH: 31.9 pg (ref 26.0–34.0)
MCV: 89.1 fL (ref 78.0–100.0)
Platelets: 169 10*3/uL (ref 150–400)
RDW: 13.3 % (ref 11.5–15.5)
WBC: 9.2 10*3/uL (ref 4.0–10.5)

## 2012-04-11 LAB — RAPID URINE DRUG SCREEN, HOSP PERFORMED
Amphetamines: NOT DETECTED
Benzodiazepines: NOT DETECTED
Opiates: NOT DETECTED

## 2012-04-11 LAB — COMPREHENSIVE METABOLIC PANEL
ALT: 27 U/L (ref 0–53)
AST: 32 U/L (ref 0–37)
Albumin: 3.7 g/dL (ref 3.5–5.2)
Calcium: 9.7 mg/dL (ref 8.4–10.5)
GFR calc Af Amer: >90 mL/min (ref >90)
Sodium: 138 mEq/L (ref 135–145)
Total Protein: 7.4 g/dL (ref 6.0–8.3)

## 2012-04-11 LAB — POCT I-STAT TROPONIN I

## 2012-04-11 LAB — ETHANOL: Alcohol, Ethyl (B): <11 mg/dL (ref 0–11)

## 2012-04-11 NOTE — ED Provider Notes (Signed)
History     CSN: [id number]  Arrival date & time 04/11/12  1147   First MD Initiated Contact with Patient 04/11/12 1156      Chief Complaint  Patient presents with  . Chest Pain  . Drug Overdose    (Consider location/radiation/quality/duration/timing/severity/associated sxs/prior treatment) The history is provided by the patient.   patient overdosed on Seroquel today. He reportedly took 10 pills of 115 mg generally Seroquel at around 10:30. He states it was done so to go to heaven. He denies other ingestion. He states he did have some chest pain. He was short. He states he has been off him. He states is gone now. He states he's had a cough. No fevers. No abdominal pain. Patient states he has used cocaine within the last week. He states he also also marijuana. He states he still wants to go to heaven.  Past Medical History  Diagnosis Date  . Human immunodeficiency virus (HIV)   . Hepatitis B   . Schizophrenia   . Bipolar 1 disorder   . Arthritis   . Depression     Past Surgical History  Procedure Laterality Date  . Tumor removal      From Chest    No family history on file.  History  Substance Use Topics  . Smoking status: Current Every Day Smoker -- 0.40 packs/day for 15 years  . Smokeless tobacco: Current User    Types: Snuff  . Alcohol Use: No     Comment: past      Review of Systems  Constitutional: Negative for activity change and appetite change.  HENT: Negative for neck stiffness.   Eyes: Negative for pain.  Respiratory: Positive for cough. Negative for chest tightness and shortness of breath.   Cardiovascular: Positive for chest pain. Negative for leg swelling.  Gastrointestinal: Negative for nausea, vomiting, abdominal pain and diarrhea.  Genitourinary: Negative for flank pain.  Musculoskeletal: Negative for back pain.  Skin: Negative for rash.  Neurological: Negative for weakness, numbness and headaches.  Psychiatric/Behavioral: Positive for suicidal  ideas. Negative for behavioral problems.    Allergies  Penicillins; Latex; and Tape  Home Medications   Current Outpatient Rx  Name  Route  Sig  Dispense  Refill  . albuterol (PROVENTIL HFA;VENTOLIN HFA) 108 (90 BASE) MCG/ACT inhaler   Inhalation   Inhale 2 puffs into the lungs every 6 (six) hours as needed for wheezing or shortness of breath.   8.5 g   0   . atazanavir (REYATAZ) 300 MG capsule   Oral   Take 300 mg by mouth every evening. For viral infection.         . benztropine (COGENTIN) 1 MG tablet   Oral   Take 1 tablet (1 mg total) by mouth 2 (two) times daily in the am and at bedtime.. For EPS.   60 tablet   0   . citalopram (CELEXA) 40 MG tablet   Oral   Take 1 tablet (40 mg total) by mouth daily with breakfast. For depression.   30 tablet   0   . HYDROcodone-acetaminophen (NORCO/VICODIN) 5-325 MG per tablet   Oral   Take 1 tablet by mouth every 6 (six) hours as needed for pain.   10 tablet   0   . methocarbamol (ROBAXIN) 500 MG tablet   Oral   Take 1 tablet (500 mg total) by mouth 2 (two) times daily.   20 tablet   0   . naproxen (NAPROSYN) 500  MG tablet   Oral   Take 1 tablet (500 mg total) by mouth 2 (two) times daily with a meal.   20 tablet   0   . QUEtiapine (SEROQUEL XR) 50 MG TB24   Oral   Take 100 mg by mouth at bedtime.         . ritonavir (NORVIR) 100 MG capsule   Oral   Take 100 mg by mouth at bedtime. For viral infection.           BP 115/66  Pulse 77  Temp(Src) 97.6 F (36.4 C) (Oral)  Resp 11  Ht 6\' 3"  (1.905 m)  Wt 217 lb (98.431 kg)  BMI 27.12 kg/m2  SpO2 97%  Physical Exam  Nursing note and vitals reviewed. Constitutional: He is oriented to person, place, and time. He appears well-developed and well-nourished.  HENT:  Head: Normocephalic and atraumatic.  Eyes: EOM are normal. Pupils are equal, round, and reactive to light.  Neck: Normal range of motion. Neck supple.  Cardiovascular: Normal rate, regular  rhythm and normal heart sounds.   No murmur heard. Pulmonary/Chest: Effort normal. He has wheezes.  Mild diffuse wheezes.  Abdominal: Soft. Bowel sounds are normal. He exhibits no distension and no mass. There is no tenderness. There is no rebound and no guarding.  Musculoskeletal: Normal range of motion. He exhibits no edema.  Neurological: He is alert and oriented to person, place, and time. No cranial nerve deficit.  Skin: Skin is warm and dry.  Psychiatric: He has a normal mood and affect.    ED Course  Procedures (including critical care time)  Labs Reviewed  COMPREHENSIVE METABOLIC PANEL - Abnormal; Notable for the following:    Alkaline Phosphatase 124 (*)    Total Bilirubin 2.2 (*)    GFR calc non Af Amer 86 (*)    All other components within normal limits  SALICYLATE LEVEL - Abnormal; Notable for the following:    Salicylate Lvl 123456 (*)    All other components within normal limits  CBC WITH DIFFERENTIAL  ACETAMINOPHEN LEVEL  ETHANOL  URINE RAPID DRUG SCREEN (HOSP PERFORMED)  POCT I-STAT TROPONIN I   Dg Chest Port 1 View  04/11/2012  *RADIOLOGY REPORT*  Clinical Data: Overdose  PORTABLE CHEST - 1 VIEW  Comparison: None.  Findings: Borderline enlarged cardiac silhouette, possibly accentuated due to decreased lung volumes and AP projection. Normal mediastinal contours.  Minimal bibasilar opacities favored to represent atelectasis.  No focal airspace opacity.  No definite pleural effusion or pneumothorax.  No evidence of edema.  No acute osseous abnormalities.  IMPRESSION: No definite acute cardiopulmonary disease on this low volume AP portable examination.   Original Report Authenticated By: Jake Seats, MD      1. Overdose, initial encounter      Date: 04/11/2012  Rate: 80  Rhythm: normal sinus rhythm  QRS Axis: normal  Intervals: normal  ST/T Wave abnormalities: normal  Conduction Disutrbances:none  Narrative Interpretation:   Old EKG Reviewed: none  available    MDM  Patient with an overdose on Seroquel. He was taken at 10:30 this morning. After discussion with poison control the patient will need a repeat EKG at 6:30. If it is still reassuring he'll be medically cleared for psychiatric placement. Patient does not appear to have worsened chest pain. EKG is reassuring and lab work is negative. Troponin, acetaminophen and salicylate are negative.         Jasper Riling. Alvino Chapel, MD 04/11/12 1530

## 2012-04-11 NOTE — ED Notes (Addendum)
Sitter remains at bedside with patient.  Suicide precautions maintained.   Patient given water per okay of MD.  Patient advised again that we need a urine specimen.  Patient claims cannot give sample at this time.

## 2012-04-11 NOTE — ED Notes (Signed)
Pt eating tray; sitter at bedside; pt alert and mentating appropriately

## 2012-04-11 NOTE — ED Provider Notes (Signed)
Repeat EKG     Date: 04/11/2012  Rate: 87  Rhythm: normal sinus rhythm  QRS Axis: normal  Intervals: normal  ST/T Wave abnormalities: normal  Conduction Disutrbances:none  Narrative Interpretation:   Old EKG Reviewed: unchanged  Will move to C pod for psych eval.      Julianne Rice, MD 04/12/12 [phone]

## 2012-04-11 NOTE — ED Notes (Signed)
Spoke with [name] from [facility] to update on pt condition. Representative states she was closing his file.

## 2012-04-11 NOTE — ED Notes (Signed)
Sitter at bedside with patient.

## 2012-04-11 NOTE — ED Notes (Signed)
Tray ordered.

## 2012-04-11 NOTE — ED Notes (Signed)
Per EMS, patient took 10 150mg  Seroquel around 1030a.m. Today.   Patient told EMS that "I just want to go to [facility]".  Patient did express intent to harm himself.   Patient was given ASA 324mg  and Zofran 4 mg en route to [facility].   Patient's chest pain resolved with EMS and patient claims chest pain gone before he came to ED.

## 2012-04-11 NOTE — ED Notes (Signed)
Poison control called to check on patient.   They had been given original information by Dr. Alvino Chapel.  Patient status unchanged.
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
## 2012-04-11 NOTE — ED Notes (Signed)
Sitter at bedside; pt resting; respirations equal and unlabored; NAD noted at this time

## 2012-04-12 ENCOUNTER — Inpatient Hospital Stay (HOSPITAL_COMMUNITY)
Admission: AD | Admit: 2012-04-12 | Discharge: 2012-04-19 | DRG: 885 | Disposition: A | Discharge status: Home / Self Care | Payer: Medicaid Other | Source: Intra-hospital | Attending: Psychiatry | Admitting: Psychiatry

## 2012-04-12 ENCOUNTER — Encounter (HOSPITAL_COMMUNITY): Payer: Self-pay | Admitting: *Deleted

## 2012-04-12 DIAGNOSIS — Z79899 Other long term (current) drug therapy: Secondary | ICD-10-CM

## 2012-04-12 DIAGNOSIS — Z21 Asymptomatic human immunodeficiency virus [HIV] infection status: Secondary | ICD-10-CM | POA: Diagnosis present

## 2012-04-12 DIAGNOSIS — B20 Human immunodeficiency virus [HIV] disease: Secondary | ICD-10-CM

## 2012-04-12 DIAGNOSIS — R45851 Suicidal ideations: Secondary | ICD-10-CM

## 2012-04-12 DIAGNOSIS — F251 Schizoaffective disorder, depressive type: Secondary | ICD-10-CM

## 2012-04-12 DIAGNOSIS — F1410 Cocaine abuse, uncomplicated: Secondary | ICD-10-CM

## 2012-04-12 DIAGNOSIS — F259 Schizoaffective disorder, unspecified: Principal | ICD-10-CM | POA: Diagnosis present

## 2012-04-12 DIAGNOSIS — B1910 Unspecified viral hepatitis B without hepatic coma: Secondary | ICD-10-CM | POA: Diagnosis present

## 2012-04-12 MED ORDER — METHOCARBAMOL 500 MG PO TABS
500.0000 mg | ORAL_TABLET | Freq: Two times a day (BID) | ORAL | Status: DC
  Administered 2012-04-12 – 2012-04-19 (×14): 500 mg via ORAL
  Filled 2012-04-12 (×19): qty 1

## 2012-04-12 MED ORDER — ATAZANAVIR SULFATE 150 MG PO CAPS
300.0000 mg | ORAL_CAPSULE | Freq: Every day | ORAL | Status: DC
  Administered 2012-04-13 – 2012-04-19 (×7): 300 mg via ORAL
  Filled 2012-04-12 (×9): qty 2

## 2012-04-12 MED ORDER — CITALOPRAM HYDROBROMIDE 40 MG PO TABS
40.0000 mg | ORAL_TABLET | Freq: Every day | ORAL | Status: DC
  Administered 2012-04-13 – 2012-04-19 (×7): 40 mg via ORAL
  Filled 2012-04-12: qty 14
  Filled 2012-04-12 (×5): qty 1
  Filled 2012-04-12: qty 14
  Filled 2012-04-12 (×3): qty 1

## 2012-04-12 MED ORDER — EMTRICITABINE-TENOFOVIR DF 200-300 MG PO TABS
1.0000 | ORAL_TABLET | Freq: Every day | ORAL | Status: DC
  Administered 2012-04-13 – 2012-04-19 (×7): 1 via ORAL
  Filled 2012-04-12 (×9): qty 1

## 2012-04-12 MED ORDER — QUETIAPINE FUMARATE ER 50 MG PO TB24
100.0000 mg | ORAL_TABLET | Freq: Every day | ORAL | Status: DC
  Filled 2012-04-12 (×3): qty 2

## 2012-04-12 MED ORDER — RITONAVIR 100 MG PO TABS
100.0000 mg | ORAL_TABLET | Freq: Every day | ORAL | Status: DC
  Administered 2012-04-13 – 2012-04-19 (×7): 100 mg via ORAL
  Filled 2012-04-12 (×9): qty 1

## 2012-04-12 MED ORDER — ALBUTEROL SULFATE HFA 108 (90 BASE) MCG/ACT IN AERS: 2.0000 | INHALATION_SPRAY | Freq: Four times a day (QID) | RESPIRATORY_TRACT | Status: DC | PRN

## 2012-04-12 MED ORDER — CITALOPRAM HYDROBROMIDE 10 MG PO TABS
40.0000 mg | ORAL_TABLET | Freq: Every day | ORAL | Status: DC
  Administered 2012-04-12: 40 mg via ORAL
  Filled 2012-04-12 (×2): qty 4

## 2012-04-12 MED ORDER — MAGNESIUM HYDROXIDE 400 MG/5ML PO SUSP: 30.0000 mL | Freq: Every day | ORAL | Status: DC | PRN

## 2012-04-12 MED ORDER — METHOCARBAMOL 500 MG PO TABS
500.0000 mg | ORAL_TABLET | Freq: Two times a day (BID) | ORAL | Status: DC
  Administered 2012-04-12: 500 mg via ORAL
  Filled 2012-04-12 (×2): qty 1

## 2012-04-12 MED ORDER — ACETAMINOPHEN 325 MG PO TABS: 650.0000 mg | ORAL_TABLET | Freq: Four times a day (QID) | ORAL | Status: DC | PRN

## 2012-04-12 MED ORDER — ATAZANAVIR SULFATE 150 MG PO CAPS
300.0000 mg | ORAL_CAPSULE | Freq: Every evening | ORAL | Status: DC
  Administered 2012-04-12: 300 mg via ORAL
  Filled 2012-04-12 (×3): qty 2

## 2012-04-12 MED ORDER — BENZTROPINE MESYLATE 1 MG PO TABS
1.0000 mg | ORAL_TABLET | ORAL | Status: DC
  Administered 2012-04-12 – 2012-04-19 (×14): 1 mg via ORAL
  Filled 2012-04-12: qty 28
  Filled 2012-04-12 (×8): qty 1
  Filled 2012-04-12 (×3): qty 28
  Filled 2012-04-12 (×9): qty 1

## 2012-04-12 MED ORDER — ALUM & MAG HYDROXIDE-SIMETH 200-200-20 MG/5ML PO SUSP
30.0000 mL | ORAL | Status: DC | PRN
  Administered 2012-04-16: 30 mL via ORAL

## 2012-04-12 MED ORDER — RITONAVIR 100 MG PO CAPS
100.0000 mg | ORAL_CAPSULE | Freq: Every day | ORAL | Status: DC
Start: 2012-04-12 — End: 2012-04-12
  Administered 2012-04-12: 100 mg via ORAL
  Filled 2012-04-12 (×3): qty 1

## 2012-04-12 MED ORDER — EMTRICITABINE-TENOFOVIR DF 200-300 MG PO TABS
1.0000 | ORAL_TABLET | Freq: Every day | ORAL | Status: DC
  Administered 2012-04-12: 1 via ORAL
  Filled 2012-04-12 (×2): qty 1

## 2012-04-12 MED ORDER — BENZTROPINE MESYLATE 1 MG PO TABS
1.0000 mg | ORAL_TABLET | ORAL | Status: DC
  Administered 2012-04-12: 1 mg via ORAL
  Filled 2012-04-12 (×2): qty 1

## 2012-04-12 NOTE — ED Provider Notes (Signed)
Filed Vitals:   04/12/12 0651  BP: 108/66  Pulse: 62  Temp: 97.7 F (36.5 C)  Resp: 17   No acute issues overnight. Patient is awaiting psychiatric placement.  Kathalene Frames, MD 04/12/12 [phone]

## 2012-04-12 NOTE — ED Notes (Signed)
Sitter went to lunch

## 2012-04-12 NOTE — Tx Team (Signed)
Initial Interdisciplinary Treatment Plan  PATIENT STRENGTHS: (choose at least two) Average or above average intelligence Capable of independent living Financial means General fund of knowledge  PATIENT STRESSORS: Health problems Legal issue Loss of anniversary of the death of his 29 month old daughter Substance abuse   PROBLEM LIST: Problem List/Patient Goals Date to be addressed Date deferred Reason deferred Estimated date of resolution  Suicide attempt and ongoing thoughts 04/12/12     Auditory hallucinations 04/12/12     Depression 04/22/12                                          DISCHARGE CRITERIA:  Improved stabilization in mood, thinking, and/or behavior Need for constant or close observation no longer present Reduction of life-threatening or endangering symptoms to within safe limits Verbal commitment to aftercare and medication compliance  PRELIMINARY DISCHARGE PLAN: Attend 12-step recovery group Outpatient therapy Return to previous living arrangement  PATIENT/FAMIILY INVOLVEMENT: This treatment plan has been presented to and reviewed with the patient, Dylan, [name] 04/12/2012, 8:14 PM
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
## 2012-04-12 NOTE — BH Assessment (Signed)
[place] Assessment Progress Note        Called area facilities to attempt to find bed availability.  Neoma Laming at [facility] reports only geriatric beds.  Left messages at [facility] and [place] at [facility] reports no sponsorship or medicaid beds.  Helene Kelp at [place] reports no available beds.  Iona Beard at [name] says there are male beds available.  Referrals faxed.

## 2012-04-12 NOTE — ED Notes (Signed)
Patient is resting comfortably. 

## 2012-04-12 NOTE — Progress Notes (Signed)
Patient was informed of unit rules/policies. Pt. Skin was searched and brought back to the unit.

## 2012-04-12 NOTE — ED Notes (Addendum)
Patient awakened from sleep for morning meds. He is cooperative. Requests to sleep a bit longer.

## 2012-04-12 NOTE — ED Notes (Signed)
Unable to give pt meds due to not available from pharmacy

## 2012-04-12 NOTE — Progress Notes (Signed)
Patient ID: Dylan [name], male   DOB: August 27, 1965, 47 y.o.   MRN: [id number] This is 47 y.o.M/ A.A./M voluntary admission with a Dx of Schizoaffective D/O, Depressive type. The patient states he over dosed on his medication because he wanted to die. He is unable to verbally elaborate at this time what led up to this suicide attempt. The patient is HIV positive and lives alone in a motel. His wife lives in an assistant living facility. He is in the process of trying to also get into an [name]. The patient admits to still feeling suicidal, but is able to contract for safety. Tomorrow is the anniversary of his only child's death a few years ago. She was 41 months old when she died related to a heart defect. The patient presents with a depressed mood and affect and is somewhat irritable. Denies frequent use of alcohol and claims he has not used cocaine recently. However, UDS is positive for cocaine. Admits to having auditory hallucinations, but is unable to express what voices are telling him. HIV medications need to be verified by pharmacy. Search and V.S. completed by day shift. Fall risk plan reviewed with the patient.

## 2012-04-12 NOTE — Progress Notes (Signed)
Patient arrived with peripheral IV still in left forearm. No documentation on IV found in computer. IV removed by RN per facility policies and procedures.

## 2012-04-12 NOTE — BH Assessment (Signed)
[place] Assessment Progress Note      Consulted with Shuvon Rankin NP re admission to the [facility] unit from [facility]. He has been accepted to a 400 hall bed when one becomes available.

## 2012-04-12 NOTE — BHH Counselor (Signed)
This patient was reviewed by the [facility] and Jimmye Norman, PA and was deferred because we do not have an appropriate bed at this time. The patient will be presented for review again when a 400 hall bed becomes available.

## 2012-04-13 DIAGNOSIS — F1910 Other psychoactive substance abuse, uncomplicated: Secondary | ICD-10-CM

## 2012-04-13 MED ORDER — TRAZODONE HCL 100 MG PO TABS
100.0000 mg | ORAL_TABLET | Freq: Every evening | ORAL | Status: DC | PRN
  Administered 2012-04-13 – 2012-04-16 (×6): 100 mg via ORAL
  Filled 2012-04-13 (×6): qty 1
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
## 2012-04-13 NOTE — Progress Notes (Addendum)
Patient ID: GEE KERBEL, male   DOB: [birth year], 47 y.o.   MRN: [id number]  D: Pt endorses SI/AVH, but contracts for safety. Pt is pleasant and cooperative. Pt complained of back pain from DDD that was 6 out of 10. Pt states voices say "stay at 45 degree angle, watch my back". Pt states he is in an environment that is very rough.   A: Pt was offered support and encouragement. Pt was given scheduled medications. Q 15 minute checks were done for safety. Pt given heat pack for pt back pain.  R: Pt is taking medication. Pt has no complaints at this time.Pt receptive to treatment and safety maintained on unit.

## 2012-04-13 NOTE — H&P (Signed)
Psychiatric Admission Assessment Adult  Patient Identification:  Dylan [name] Date of Evaluation:  04/13/2012 Chief Complaint:  Psychosis NOS   Depressive Disorder NOS   Schizoeffective   Dylan [name] is an 47 y.o. male brought to [facility] post overdose on his medications because he wanted to "go to heaven."  He reports that he overdosed because he felt like something was gnawing on his flesh. He states he had one previous attempt, but he cannot remember the method, due to the same problem. He also endorses auditory command hallucinations, but cannot what the voices are saying at this time. He endorses increased depression and denies HI.Marland Kitchen He also admits to drinking regularly, but states it's been several days since his last drink, and reports cocaine use a few times a week. He also feels like people are after him to hurt him now.   Psychiatric Specialty Exam: Physical Exam  ROS  Blood pressure 110/61, pulse 83, temperature 97.8 F (36.6 C), temperature source Oral, resp. rate 20.There is no weight on file to calculate BMI.                                              Mental Status Examination/Evaluation:  Appearance: on bed  Eye Contact::poor  Speech: normal  Volume: Normal  Mood: depressed  Affect: ristricted  Thought Process: disorganized  Orientation: Full  Thought Content: NO AVH  Suicidal Thoughts: yes  No plan  Homicidal Thoughts: no  Memory: Recent; Poor  Judgement: Impaired  Insight: Lacking  Psychomotor Activity: Normal  Concentration: Fair  Recall: Fair  Akathisia: No  Past Psychiatric History: Diagnosis: cocaine abuse  Hospitalizations: yes  Outpatient Care:unknown  Substance Abuse Care: denies  Self-Mutilation:  Suicidal Attempts:yes many  Violent Behaviors:denies   Past Medical History:   Past Medical History  Diagnosis Date  . Human immunodeficiency virus (HIV)   . Hepatitis B   . Schizophrenia   . Bipolar 1  disorder   . Arthritis   . Depression     Allergies:   Allergies  Allergen Reactions  . Penicillins Other (See Comments)    Childhood allergy  . Latex Rash  . Tape Rash   PTA Medications: Prescriptions prior to admission  Medication Sig Dispense Refill  . albuterol (PROVENTIL HFA;VENTOLIN HFA) 108 (90 BASE) MCG/ACT inhaler Inhale 2 puffs into the lungs every 6 (six) hours as needed for wheezing or shortness of breath.  8.5 g  0  . atazanavir (REYATAZ) 300 MG capsule Take 300 mg by mouth every evening. For viral infection.      . benztropine (COGENTIN) 1 MG tablet Take 1 tablet (1 mg total) by mouth 2 (two) times daily in the am and at bedtime.. For EPS.  60 tablet  0  . citalopram (CELEXA) 40 MG tablet Take 1 tablet (40 mg total) by mouth daily with breakfast. For depression.  30 tablet  0  . emtricitabine-tenofovir (TRUVADA) 200-300 MG per tablet Take 1 tablet by mouth daily.      Marland Kitchen HYDROcodone-acetaminophen (NORCO/VICODIN) 5-325 MG per tablet Take 1 tablet by mouth every 6 (six) hours as needed for pain. For pain      . methocarbamol (ROBAXIN) 500 MG tablet Take 1 tablet (500 mg total) by mouth 2 (two) times daily.  20 tablet  0  . QUEtiapine (SEROQUEL XR) 50 MG TB24 Take 100 mg  by mouth at bedtime.      . ritonavir (NORVIR) 100 MG capsule Take 100 mg by mouth every evening. For viral infection.        Previous Psychotropic Medications:  Medication/Dose                 Substance Abuse History in the last 12 months:  yes  Consequences of Substance Abuse: unable to function  Social History:  reports that he has been smoking Cigarettes.  He has a 15 pack-year smoking history. He uses smokeless tobacco. He reports that he uses illicit drugs (Cocaine). He reports that he does not drink alcohol. Additional Social History:                      Current Place of Residence:   Place of Birth:   Family Members: Marital Status:   Single Children:  Sons:  Daughters: Relationships: Education:  some school Educational Problems/Performance: Religious Beliefs/Practices: History of Abuse (Emotional/Phsycial/Sexual) Ship broker History:  None. Legal History: Hobbies/Interests:  Family History:  History reviewed. No pertinent family history.  Results for orders placed during the hospital encounter of 04/11/12 (from the past 72 hour(s))  CBC WITH DIFFERENTIAL     Status: None   Collection Time    04/11/12 12:30 PM      Result Value Range   WBC 9.2  4.0 - 10.5 K/uL   RBC 4.70  4.22 - 5.81 MIL/uL   Hemoglobin 15.0  13.0 - 17.0 g/dL   HCT 41.9  39.0 - 52.0 %   MCV 89.1  78.0 - 100.0 fL   MCH 31.9  26.0 - 34.0 pg   MCHC 35.8  30.0 - 36.0 g/dL   RDW 13.3  11.5 - 15.5 %   Platelets 169  150 - 400 K/uL   Neutrophils Relative 64  43 - 77 %   Neutro Abs 5.9  1.7 - 7.7 K/uL   Lymphocytes Relative 25  12 - 46 %   Lymphs Abs 2.3  0.7 - 4.0 K/uL   Monocytes Relative 8  3 - 12 %   Monocytes Absolute 0.7  0.1 - 1.0 K/uL   Eosinophils Relative 3  0 - 5 %   Eosinophils Absolute 0.3  0.0 - 0.7 K/uL   Basophils Relative 0  0 - 1 %   Basophils Absolute 0.0  0.0 - 0.1 K/uL  COMPREHENSIVE METABOLIC PANEL     Status: Abnormal   Collection Time    04/11/12 12:30 PM      Result Value Range   Sodium 138  135 - 145 mEq/L   Potassium 3.8  3.5 - 5.1 mEq/L   Chloride 103  96 - 112 mEq/L   CO2 26  19 - 32 mEq/L   Glucose, Bld 91  70 - 99 mg/dL   BUN 12  6 - 23 mg/dL   Creatinine, Ser 1.02  0.50 - 1.35 mg/dL   Calcium 9.7  8.4 - 10.5 mg/dL   Total Protein 7.4  6.0 - 8.3 g/dL   Albumin 3.7  3.5 - 5.2 g/dL   AST 32  0 - 37 U/L   ALT 27  0 - 53 U/L   Alkaline Phosphatase 124 (*) 39 - 117 U/L   Total Bilirubin 2.2 (*) 0.3 - 1.2 mg/dL   GFR calc non Af Amer 86 (*) >90 mL/min   GFR calc Af Amer >90  >90 mL/min   Comment:  The eGFR has been calculated     using the CKD EPI equation.     This  calculation has not been     validated in all clinical     situations.     eGFR's persistently     <90 mL/min signify     possible Chronic Kidney Disease.  ACETAMINOPHEN LEVEL     Status: None   Collection Time    04/11/12 12:30 PM      Result Value Range   Acetaminophen (Tylenol), Serum <15.0  10 - 30 ug/mL   Comment:            THERAPEUTIC CONCENTRATIONS VARY     SIGNIFICANTLY. A RANGE OF 10-30     ug/mL MAY BE AN EFFECTIVE     CONCENTRATION FOR MANY PATIENTS.     HOWEVER, SOME ARE BEST TREATED     AT CONCENTRATIONS OUTSIDE THIS     RANGE.     ACETAMINOPHEN CONCENTRATIONS     >150 ug/mL AT 4 HOURS AFTER     INGESTION AND >50 ug/mL AT 12     HOURS AFTER INGESTION ARE     OFTEN ASSOCIATED WITH TOXIC     REACTIONS.  SALICYLATE LEVEL     Status: Abnormal   Collection Time    04/11/12 12:30 PM      Result Value Range   Salicylate Lvl 123456 (*) 2.8 - 20.0 mg/dL  ETHANOL     Status: None   Collection Time    04/11/12 12:30 PM      Result Value Range   Alcohol, Ethyl (B) <11  0 - 11 mg/dL   Comment:            LOWEST DETECTABLE LIMIT FOR     SERUM ALCOHOL IS 11 mg/dL     FOR MEDICAL PURPOSES ONLY  POCT I-STAT TROPONIN I     Status: None   Collection Time    04/11/12 12:45 PM      Result Value Range   Troponin i, poc 0.01  0.00 - 0.08 ng/mL   Comment 3            Comment: Due to the release kinetics of cTnI,     a negative result within the first hours     of the onset of symptoms does not rule out     myocardial infarction with certainty.     If myocardial infarction is still suspected,     repeat the test at appropriate intervals.  URINE RAPID DRUG SCREEN (HOSP PERFORMED)     Status: Abnormal   Collection Time    04/11/12  3:58 PM      Result Value Range   Opiates NONE DETECTED  NONE DETECTED   Cocaine POSITIVE (*) NONE DETECTED   Benzodiazepines NONE DETECTED  NONE DETECTED   Amphetamines NONE DETECTED  NONE DETECTED   Tetrahydrocannabinol NONE DETECTED  NONE  DETECTED   Barbiturates NONE DETECTED  NONE DETECTED   Comment:            DRUG SCREEN FOR MEDICAL PURPOSES     ONLY.  IF CONFIRMATION IS NEEDED     FOR ANY PURPOSE, NOTIFY LAB     WITHIN 5 DAYS.                LOWEST DETECTABLE LIMITS     FOR URINE DRUG SCREEN     Drug Class       Cutoff (ng/mL)  Amphetamine      1000     Barbiturate      200     Benzodiazepine   [id number]     Tricyclics       [id number]     Opiates          300     Cocaine          300     THC              50   Psychological Evaluations:  Assessment:   AXIS I:  Substance Abuse, hx of schizoaffective d/o AXIS II:  Deferred AXIS III:   Past Medical History  Diagnosis Date  . Human immunodeficiency virus (HIV)   . Hepatitis B   . Schizophrenia   . Bipolar 1 disorder   . Arthritis   . Depression    AXIS IV:  other psychosocial or environmental problems AXIS V:  21-30 behavior considerably influenced by delusions or hallucinations OR serious impairment in judgment, communication OR inability to function in almost all areas  Treatment Plan/Recommendations:     Continue current meds  Treatment Plan Summary: Daily contact with patient to assess and evaluate symptoms and progress in treatment Current Medications:  Current Facility-Administered Medications  Medication Dose Route Frequency Provider Last Rate Last Dose  . acetaminophen (TYLENOL) tablet 650 mg  650 mg Oral Q6H PRN Shuvon Rankin, NP      . alum & mag hydroxide-simeth (MAALOX/MYLANTA) 200-200-20 MG/5ML suspension 30 mL  30 mL Oral Q4H PRN Shuvon Rankin, NP      . atazanavir (REYATAZ) capsule 300 mg  300 mg Oral Q breakfast Waylan Boga, NP      . benztropine (COGENTIN) tablet 1 mg  1 mg Oral BH-qamhs Shuvon Rankin, NP   1 mg at 04/12/12 2333  . citalopram (CELEXA) tablet 40 mg  40 mg Oral Q breakfast Shuvon Rankin, NP      . emtricitabine-tenofovir (TRUVADA) 200-300 MG per tablet 1 tablet  1 tablet Oral Daily Waylan Boga, NP      . magnesium hydroxide  (MILK OF MAGNESIA) suspension 30 mL  30 mL Oral Daily PRN Shuvon Rankin, NP      . methocarbamol (ROBAXIN) tablet 500 mg  500 mg Oral BID Shuvon Rankin, NP   500 mg at 04/12/12 2333  . ritonavir (NORVIR) tablet 100 mg  100 mg Oral Q breakfast Waylan Boga, NP      . traZODone (DESYREL) tablet 100 mg  100 mg Oral QHS PRN,MR X 1 Waylan Boga, NP   100 mg at 04/13/12 0107    Observation Level/Precautions:  15 minute checks  Laboratory:  lipid profile later if needed  Psychotherapy:    Medications:    Consultations:    Discharge Concerns:    Estimated LOS: 7 days  Other:     I certify that inpatient services furnished can reasonably be expected to improve the patient's condition.   Raiford Simmonds 4/5/201411:05 AM

## 2012-04-13 NOTE — B[facility] Group Notes (Signed)
Guadalupe Guerra LCSW Group Therapy  04/13/2012   Did not attend - came in the last 5 minutes.  Lysle Dingwall 04/13/2012, 12:20 PM

## 2012-04-13 NOTE — Progress Notes (Signed)
D   Pt was in his room this morning talking to himself and told this writer he tries to stay away from people because he thinks others are trying to hurt him   He reports poor sleep   He did come out of his room after lunch and has watched TV and appears to be less paranoid as he is interacting some with select pts   He is compliant with medications A   Verbal support given  Medications administered and effectiveness monitored  Q 15 min checks R   Pt safe at present

## 2012-04-14 MED ORDER — RISPERIDONE 0.5 MG PO TABS
0.5000 mg | ORAL_TABLET | Freq: Every day | ORAL | Status: DC
  Administered 2012-04-14 – 2012-04-15 (×2): 0.5 mg via ORAL
  Filled 2012-04-14 (×4): qty 1

## 2012-04-14 MED ORDER — NICOTINE POLACRILEX 2 MG MT GUM
2.0000 mg | CHEWING_GUM | OROMUCOSAL | Status: DC | PRN
  Administered 2012-04-14 – 2012-04-15 (×3): 2 mg via ORAL

## 2012-04-14 NOTE — Progress Notes (Signed)
Patient ID: Dylan [name], male   DOB: Dec 10, 1965, 47 y.o.   MRN: [id number]  S;  Seen today. Still thinks he feels like people after him. Less depressed now. Fair sleep but feels sleepy now.  Mental Status Examination/Evaluation:  Appearance: on bed  Eye Contact::poor  Speech: normal  Volume: Normal  Mood: depressed  Affect: ristricted  Thought Process: disorganized  Orientation: Full  Thought Content: NO AVH, paranoid Suicidal Thoughts: denies  Homicidal Thoughts: no  Memory: Recent; Poor  Judgement: Impaired  Insight: Lacking  Psychomotor Activity: Normal  Concentration: Fair  Recall: Fair  Akathisia: No   Assessment:  AXIS I: Substance Abuse, hx of schizoaffective d/o  AXIS II: Deferred  AXIS III:  Past Medical History   Diagnosis  Date   .  Human immunodeficiency virus (HIV)    .  Hepatitis B    .  Schizophrenia    .  Bipolar 1 disorder    .  Arthritis    .  Depression     AXIS IV: other psychosocial or environmental problems  AXIS V: 21-30 behavior considerably influenced by delusions or hallucinations OR serious impairment in judgment, communication OR inability to function in almost all areas   Treatment Plan/Recommendations:   Start risperidone 0.5 mg qhs for paranoid thoughts

## 2012-04-14 NOTE — Progress Notes (Signed)
Psychoeducational Group Note  Date:  04/14/2012 Time:  0945 am  Group Topic/Focus:  Making Healthy Choices:   The focus of this group is to help patients identify negative/unhealthy choices they were using prior to admission and identify positive/healthier coping strategies to replace them upon discharge.  Participation Level:  Active  Participation Quality:  Appropriate  Affect:  Appropriate  Cognitive:  Alert  Insight:  Improving  Engagement in Group:  Engaged  Additional Comments:    Migdalia Dk 04/14/2012, 10:29 AM

## 2012-04-14 NOTE — Progress Notes (Signed)
D:  Patient's self inventory sheet, patient has fair sleep, improving appetite, low energy level, improving attention span.  Rated depression, anxiety and hopelessness #10.  Denied withdrawals. SI, contracts for safety.  Has felt lightheaded, pain in past 24 hours.  "People keep following me around, shadows are here."  Denied voices at this time.  Needs assistance after discharge. A:  Medications administered per MD order.  Support given throughout day. R:  Following treatment plan.  Denied HI.  Denied auditory hallucinations.  Contracts for safety.  Stated he has seen people/shadows following him today.

## 2012-04-14 NOTE — B[facility] Group Notes (Signed)
Lamar LCSW Group Therapy  04/14/2012   Did not attend  Lysle Dingwall 04/14/2012, 12:37 PM

## 2012-04-14 NOTE — Progress Notes (Signed)
Patient ID: Dylan [name], male   DOB: [birth year], 47 y.o.   MRN: [id number] D. The patient has a depressed mood and affect. He spent the evening in the dayroom watching TV, but with minimal interaction with the milieu. He looks suspiciously at others.  A. Met with patient to assess. Reviewed and administered medications.  R. The patient states he is still experiencing auditory hallucinations. Denies any suicidal ideation at present and can verbally contract for safety.

## 2012-04-14 NOTE — Progress Notes (Signed)
D. Pt has been up and visible in the milieu today, has participated in some milieu activities. Pt presents with a somewhat paranoid thought content and thinks people are following him. Pt did receive his medications this evening without incident and has not verbalized any further complaints. A. Support and encouragement provided. R. Will continue to monitor.

## 2012-04-15 DIAGNOSIS — F259 Schizoaffective disorder, unspecified: Principal | ICD-10-CM

## 2012-04-15 DIAGNOSIS — F1410 Cocaine abuse, uncomplicated: Secondary | ICD-10-CM

## 2012-04-15 DIAGNOSIS — B20 Human immunodeficiency virus [HIV] disease: Secondary | ICD-10-CM

## 2012-04-15 NOTE — Progress Notes (Signed)
[facility] MD Progress Note  04/15/2012 5:17 PM Dylan [name]  MRN:  [id number] Subjective:  "I'm so-so." Objective: Patient is up and active in the unit miliue. This is his 3rd or 4th admission to [facility] since August or 2013. Once again Dylan [name] has been using cocaine. Once again he reported to the ED that he had taken an Overdose of Seroquel.  He was observed for 1.5 days in the ED and transferred to [facility] for further treatment and care.  Dylan [name] states " I just don't want to be here anymore."  Diagnosis:  Schizoaffective disorder, OD, HIV +, Cocaine abuse  ADL's:  Intact  Sleep: Poor  Appetite:  Poor  Suicidal Ideation:  denies Homicidal Ideation:  + "toward the voices I hear each day." AEB (as evidenced by): Patient's report of reduced symptoms.  Psychiatric Specialty Exam: Review of Systems  Constitutional: Negative.  Negative for fever, chills, weight loss, malaise/fatigue and diaphoresis.  HENT: Negative for congestion and sore throat.   Eyes: Negative for blurred vision, double vision and photophobia.  Respiratory: Negative for cough, shortness of breath and wheezing.   Cardiovascular: Negative for chest pain, palpitations and PND.  Gastrointestinal: Negative for heartburn, nausea, vomiting, abdominal pain, diarrhea and constipation.  Musculoskeletal: Negative for myalgias, joint pain and falls.  Neurological: Negative for dizziness, tingling, tremors, sensory change, speech change, focal weakness, seizures, loss of consciousness, weakness and headaches.  Endo/Heme/Allergies: Negative for polydipsia. Does not bruise/bleed easily.  Psychiatric/Behavioral: Negative for depression, suicidal ideas, hallucinations, memory loss and substance abuse. The patient is not nervous/anxious and does not have insomnia.     Blood pressure 106/61, pulse 78, temperature 97.6 F (36.4 C), temperature source Oral, resp. rate 20.There is no weight on file to calculate BMI.  General Appearance: Casual  Eye  Contact::  Fair  Speech:  Clear and Coherent  Volume:  Normal  Mood:  Depressed  Affect:  Congruent  Thought Process:  Goal Directed  Orientation:  Full (Time, Place, and Person)  Thought Content:  Paranoid Ideation  Suicidal Thoughts:  denies  Homicidal Thoughts:  Yes.  without intent/plan  Memory:  Immediate;   Fair  Judgement:  Impaired  Insight:  Lacking  Psychomotor Activity:  Normal  Concentration:  Poor  Recall:  Poor  Akathisia:  No  Handed:  Right  AIMS (if indicated):     Assets:  Social Support  Sleep:  Number of Hours: 6.5   Current Medications: Current Facility-Administered Medications  Medication Dose Route Frequency Provider Last Rate Last Dose  . acetaminophen (TYLENOL) tablet 650 mg  650 mg Oral Q6H PRN Shuvon Rankin, NP      . alum & mag hydroxide-simeth (MAALOX/MYLANTA) 200-200-20 MG/5ML suspension 30 mL  30 mL Oral Q4H PRN Shuvon Rankin, NP      . atazanavir (REYATAZ) capsule 300 mg  300 mg Oral Q breakfast Waylan Boga, NP   300 mg at 04/15/12 0810  . benztropine (COGENTIN) tablet 1 mg  1 mg Oral BH-qamhs Shuvon Rankin, NP   1 mg at 04/15/12 0810  . citalopram (CELEXA) tablet 40 mg  40 mg Oral Q breakfast Shuvon Rankin, NP   40 mg at 04/15/12 0810  . emtricitabine-tenofovir (TRUVADA) 200-300 MG per tablet 1 tablet  1 tablet Oral Daily Waylan Boga, NP   1 tablet at 04/15/12 0810  . magnesium hydroxide (MILK OF MAGNESIA) suspension 30 mL  30 mL Oral Daily PRN Shuvon Rankin, NP      . methocarbamol (ROBAXIN) tablet  500 mg  500 mg Oral BID Shuvon Rankin, NP   500 mg at 04/15/12 1703  . nicotine polacrilex (NICORETTE) gum 2 mg  2 mg Oral PRN Mojeed Akintayo   2 mg at 04/15/12 1704  . risperiDONE (RISPERDAL) tablet 0.5 mg  0.5 mg Oral QHS Raiford Simmonds, MD   0.5 mg at 04/14/12 2148  . ritonavir (NORVIR) tablet 100 mg  100 mg Oral Q breakfast Waylan Boga, NP   100 mg at 04/15/12 0810  . traZODone (DESYREL) tablet 100 mg  100 mg Oral QHS PRN,MR X 1 Waylan Boga, NP    100 mg at 04/14/12 2149    Lab Results: No results found for this or any previous visit (from the past 48 hour(s)).  Physical Findings: AIMS: Facial and Oral Movements Muscles of Facial Expression: None, normal Lips and Perioral Area: None, normal Jaw: None, normal Tongue: None, normal,Extremity Movements Upper (arms, wrists, hands, fingers): None, normal Lower (legs, knees, ankles, toes): None, normal, Trunk Movements Neck, shoulders, hips: None, normal, Overall Severity Severity of abnormal movements (highest score from questions above): None, normal Incapacitation due to abnormal movements: None, normal Patient's awareness of abnormal movements (rate only patient's report): No Awareness, Dental Status Current problems with teeth and/or dentures?: No Does patient usually wear dentures?: No  CIWA:  CIWA-Ar Total: 3 COWS:  COWS Total Score: 1  Treatment Plan Summary: Daily contact with patient to assess and evaluate symptoms and progress in treatment  Plan: 1. Continue crisis management and stabilization. 2. Medication management to reduce current symptoms to base line and improve patient's overall level of functioning 3. Treat health problems as indicated. 4. Develop treatment plan to decrease risk of relapse upon discharge and the need for     readmission. 5. Psycho-social education regarding relapse prevention and self care. 6. Health care follow up as needed for medical problems. 7. Continue home medications where appropriate. 8.ELOS: 1-2 days. 9. Recommend D/C unless patient wants residential tx for substance abuse. It is this provider's opinion that he is likely at baseline and will return to using cocaine shortly upon discharge.  It is also recommended that he will need placement elsewhere should he need admission in the near future. Medical Decision Making Problem Points:  Established problem, stable/improving (1) Data Points:  Review or order medicine tests (1)  I  certify that inpatient services furnished can reasonably be expected to improve the patient's condition.  Marlane Hatcher. Nilam Quakenbush RPAC 9:04 PM 04/15/2012

## 2012-04-15 NOTE — Progress Notes (Signed)
The focus of this group is to help patients review their daily goal of treatment and discuss progress on daily workbooks. Pt attended the evening group session and responded to all discussion prompts. Pt expressed embarrassment at having to return to [facility] "once again," but said that he had an overall good day. Pt said that he met his daily goals of eating healthy and attending all groups. The Writer offered encouragement to the Pt by reminding him that mental health treatment sometimes encompasses multiple inpatient visits and that there was no shame in it. His peers agreed and offered support as well. Pt smiled throughout group, offered constructive comments to his peers and appeared engaged in the discussion.

## 2012-04-15 NOTE — Progress Notes (Signed)
Pt sitting in the dayroom watching TV and eating a snack.  Pt reports he is doing ok this evening.  He is waiting to hear if he will get housing through a program called [facility].  He says he is a little anxious until he hears something.  He denies SI/HI this evening.  Pt voiced no other needs or concerns at this time.  Pt encouraged to make his needs known.  Support and encouragement given.  Safety maintained with q15 minute checks.

## 2012-04-15 NOTE — Tx Team (Signed)
  Interdisciplinary Treatment Plan Update   Date Reviewed:  04/15/2012  Time Reviewed:  8:35 AM  Progress in Treatment:   Attending groups: Yes Participating in groups: Yes Taking medication as prescribed: Yes  Tolerating medication: Yes Family/Significant other contact made: No  Patient understands diagnosis: Yes  As evidenced by asking for help with psychosis, depression Discussing patient identified problems/goals with staff: Yes  See initial plan Medical problems stabilized or resolved: Yes Denies suicidal/homicidal ideation: Yes  In AM group Patient has not harmed self or others: Yes  For review of initial/current patient goals, please see plan of care.  Estimated Length of Stay:  3-4 days  Reason for Continuation of Hospitalization: Depression Hallucinations Medication stabilization  New Problems/Goals identified:  N/A  Discharge Plan or Barriers:   unknown  Additional Comments: Dylan [name] is an 47 y.o. male brought to [facility] post overdose on his medications because he wanted to "go to heaven." Upon assessment, Mr Spross is softspoken and drowsy, but primarily calm and cooperative, although he becomes irritable during certain questions. He reports that he overdosed because he felt like something was gnawing on his flesh. He states he had one previous attempt, but he cannot remember the method, due to the same problem. He also endorses auditory command hallucinations, but cannot tell me what the voices are saying at this time. He endorses increased depression and denies HI, but does admit to wanting to hurt people sometimes when he becomes angry although he says his last violent episode was a long time ago. He also admits to drinking regularly, but states it's been several days since his last drink, and reports cocaine use a few times a week   Attendees:  Signature: Corena Pilgrim, MD 04/15/2012 8:35 AM   Signature: Ripley Fraise, LCSW 04/15/2012 8:35 AM  Signature: Nena Polio,  PA 04/15/2012 8:35 AM  Signature: Mayra Neer, RN 04/15/2012 8:35 AM  Signature:  04/15/2012 8:35 AM  Signature:  04/15/2012 8:35 AM  Signature:   04/15/2012 8:35 AM  Signature:    Signature:    Signature:    Signature:    Signature:    Signature:      Scribe for Treatment Team:   Ripley Fraise, LCSW  04/15/2012 8:35 AM

## 2012-04-15 NOTE — BHH Group Notes (Signed)
[facility] LCSW Aftercare Discharge Planning Group Note   04/15/2012 4:02 PM  Participation Quality:  Engaged  Mood/Affect:  Depressed  Depression Rating:  unknown  Anxiety Rating:  unknown  Thoughts of Suicide:  No Will you contract for safety?   NA  Current AVH:  No  Plan for Discharge/Comments:  Unknown at this time  Guerin states he is open to [name].  I explained he cannot get in unless he has income, and he is still appealing disability.  Is working with [employer]" and on April 12 will hear if he has been approved for rent free housing.  Transportation Means: public transportation, foot  Supports: Formal only  [place], [place] B

## 2012-04-15 NOTE — Clinical Social Work Note (Signed)
  Type of Therapy: Process Group Therapy  Participation Level:  Active  Participation Quality:  Attentive  Affect:  Flat  Cognitive:  Oriented  Insight:  Improving  Engagement in Group:  Engaged  Engagement in Therapy:  Improving  Modes of Intervention:  Activity, Clarification, Education, Problem-solving and Support  Summary of Progress/Problems: Today's group addressed the issue of overcoming obstacles.  Patients were asked to identify their biggest obstacle post d/c that stands in the way of their on-going success, and then problem solve as to how to manage this.Agron states his biggest obstacle is "myself."  He went on to talk about homelessness, relapse and anniversaries of deaths of daughter, mother and marriage as April stressors.  Finally decided that homelessness is biggest obstacle, but does not appear to be overly concerned about it.  Will hear about "[employer]" decision on the 12th about possible entry into a rent-free apartment.  Was willing to hear feedback, and appeared to enjoy giving feedback to others.       Roque Lias B 04/15/2012   4:06 PM

## 2012-04-15 NOTE — Progress Notes (Signed)
D:  Pt refused to fill out self inventory sheet.  States that he woke up at 230am and was unable to fall back asleep, states that he is not eating well, c/o back pain, denies SI/HI/AVH, states that he had VH last night but has not had any this am.  A:  Emotional support provided, Encouraged pt to continue with treatment plan and attend all group activities, q15 min checks maintained for safety, prn pain meds given per orders.  R:  Pt forwards little, minimal participation.

## 2012-04-15 NOTE — Progress Notes (Signed)
Recreation Therapy Notes  Date: 04.07.2014  Time: 9:30am  Location: [address] Day Room   Group Topic/Focus: Memory   Participation Level:  Active   Participation Quality:  Appropriate   Affect:  Euthymic   Cognitive:  Appropriate   Additional Comments: Patient completed series of stretches demonstrated by LRT. Patient participated in memory game. Patient with peers and LRT developed sequence of actions to be completed by each patient. Sequence included: pat head x2, pat chest in "X" pattern x4, pat leg, snap fingers, bump right hand on top of left hand, hands over head in sweeping motion towards back, squat, kick left leg front, kick left leg back. Patient complained of knee pain and stated "I gotta sit down." Patient completed sequence from seated position for remainder of group session. Patient actively participated in group activity. Patient interacted appropriately with peers and LRT.    Laureen Ochs Yarelin Reichardt, LRT/CTRS  Jacorie Ernsberger L 04/15/2012 1:11 PM

## 2012-04-15 NOTE — Progress Notes (Signed)
Patient ID: Dylan [name], male   DOB: 04-Aug-1965, 47 y.o.   MRN: [id number]  D: Patient lying in bed. Respirations even and non-labored. A: Staff will check q 15 minutes, follow treatment plan, and give meds as ordered. R: Appears to be sleeping.

## 2012-04-16 ENCOUNTER — Encounter: Payer: Self-pay | Admitting: *Deleted

## 2012-04-16 DIAGNOSIS — Z21 Asymptomatic human immunodeficiency virus [HIV] infection status: Secondary | ICD-10-CM

## 2012-04-16 MED ORDER — HALOPERIDOL 2 MG PO TABS
2.0000 mg | ORAL_TABLET | Freq: Two times a day (BID) | ORAL | Status: DC
  Administered 2012-04-16 – 2012-04-19 (×6): 2 mg via ORAL
  Filled 2012-04-16: qty 1
  Filled 2012-04-16: qty 28
  Filled 2012-04-16 (×3): qty 1
  Filled 2012-04-16: qty 28
  Filled 2012-04-16 (×2): qty 1
  Filled 2012-04-16: qty 28
  Filled 2012-04-16 (×2): qty 1
  Filled 2012-04-16: qty 28

## 2012-04-16 MED ORDER — HALOPERIDOL DECANOATE 100 MG/ML IM SOLN
100.0000 mg | Freq: Once | INTRAMUSCULAR | Status: AC
  Administered 2012-04-16: 100 mg via INTRAMUSCULAR
  Filled 2012-04-16: qty 1

## 2012-04-16 MED ORDER — DIPHENHYDRAMINE HCL 50 MG/ML IJ SOLN
50.0000 mg | Freq: Once | INTRAMUSCULAR | Status: AC
  Administered 2012-04-16: 50 mg via INTRAMUSCULAR
  Filled 2012-04-16: qty 1

## 2012-04-16 NOTE — Progress Notes (Signed)
Pt reports he is doing well.  He started the Haldol Dec injection today and feels this will help him stay on his medication.  Staff answered his questions regarding the med.  Pt denies SI/HI, but still hears voices off and on.  He reports he is still waiting and is anxious about the application with [employer] which he will hear about on 4/12.  Pt is pleasant/cooperative.  He voices no needs/concerns at this time.  Support/encouragement offered.  Pt makes his needs known to staff.  Safety maintained with q15 minute checks.

## 2012-04-16 NOTE — Progress Notes (Signed)
The focus of this group is to help patients review their daily goal of treatment and discuss progress on daily workbooks. Pt attended the evening group session and responded to all discussion prompts. Pt stated that he had a good day today because he was able to go to the courtyard and enjoy nature. Pt also reported benefiting from being able to pray with one of his peers on the unit today, which he found great comfort in. Pt shared that in one year he would like to be living in an apartment with his wife. Pt smiled throughout group, offered constructive comments to his peers and appeared engaged in the discussion.

## 2012-04-16 NOTE — Progress Notes (Signed)
D:Pt denies SI/HI/AVH this morning at med window. Pt wrote on his self inventory sheet that he has off and on SI. Pt contracts for safety. Pt rated depression 7/10 and hopelessness 6/10. Pt reported that he spoke with his wife on the phone and she was laughing at him. Per pt, he does not remember having any bizarre behaviors that his wife accused him of. Pt is compliant with meds and treatment.  A: Medications administered as ordered per MD. Verbal support given. Pt encouraged to attend groups. 15 minute checks performed for safety.   R: Pt is cooperative and pleasant today. Minimal interaction on unit.

## 2012-04-16 NOTE — Clinical Social Work Note (Signed)
[place] LCSW Group Therapy  04/16/2012 , 1:21 PM   Type of Therapy:  Group Therapy  Participation Level:  Active  Participation Quality:  Attentive  Affect:  Appropriate  Cognitive:  Alert  Insight:  Improving  Engagement in Therapy:  Engaged  Modes of Intervention:  Discussion, Exploration and Socialization  Summary of Progress/Problems: Today's group focused on the term Diagnosis.  Participants were asked to define the term, and then pronounce whether it is a negative, positive or neutral term.Ioanis talked about self awareness and how he needs to py attention to how he is doing; particularly noticing symptoms of his illness.  At that point, he needs to address that, and added with a smile that it usually means he needs to get back on his medication.  Is open to injectable. Had good feedback for others.  Roque Lias B 04/16/2012 , 1:21 PM

## 2012-04-16 NOTE — Progress Notes (Signed)
Patient ID: Dylan [name], male   DOB: 12/14/1965, 47 y.o.   MRN: [id number] [facility] MD Progress Note  04/16/2012 10:34 AM Dylan [name]  MRN:  [id number] Subjective:  "I am ready to take injection once every months to treat my mental condition"  Objective: Patient reports decreased craving for drugs, anxiety and auditory hallucinations. He endorsed low energy level, lack of motivation, feeling hopeless and poor concentration. He report poor compliance with his medication and follow up treatment, but he is willing to take his medication in an injectable form once a month.  Diagnosis:  Schizoaffective disorder, OD, HIV +, Cocaine abuse  ADL's:  Intact  Sleep: Fair  Appetite:  Fair  Suicidal Ideation:  denies Homicidal Ideation:  + "toward the voices I hear each day." AEB (as evidenced by): Patient's report of reduced symptoms.  Psychiatric Specialty Exam: Review of Systems  Constitutional: Negative.  Negative for fever, chills, weight loss, malaise/fatigue and diaphoresis.  HENT: Negative for congestion and sore throat.   Eyes: Negative for blurred vision, double vision and photophobia.  Respiratory: Negative for cough, shortness of breath and wheezing.   Cardiovascular: Negative for chest pain, palpitations and PND.  Gastrointestinal: Negative for heartburn, nausea, vomiting, abdominal pain, diarrhea and constipation.  Musculoskeletal: Negative for myalgias, joint pain and falls.  Neurological: Negative for dizziness, tingling, tremors, sensory change, speech change, focal weakness, seizures, loss of consciousness, weakness and headaches.  Endo/Heme/Allergies: Negative for polydipsia. Does not bruise/bleed easily.  Psychiatric/Behavioral: Positive for suicidal ideas and substance abuse. Negative for depression, hallucinations and memory loss. The patient is nervous/anxious. The patient does not have insomnia.     Blood pressure 126/70, pulse 78, temperature 98.1 F (36.7 C),  temperature source Oral, resp. rate 17.There is no weight on file to calculate BMI.  General Appearance: Casual  Eye Contact::  Fair  Speech:  Clear and Coherent  Volume:  Normal  Mood:  Depressed  Affect:  Congruent  Thought Process:  Goal Directed  Orientation:  Full (Time, Place, and Person)  Thought Content:  Paranoid Ideation  Suicidal Thoughts:  denies  Homicidal Thoughts:  Yes.  without intent/plan  Memory:  Immediate;   Fair  Judgement:  Impaired  Insight:  Lacking  Psychomotor Activity:  Normal  Concentration:  Poor  Recall:  Poor  Akathisia:  No  Handed:  Right  AIMS (if indicated):     Assets:  Social Support  Sleep:  Number of Hours: 6.5   Current Medications: Current Facility-Administered Medications  Medication Dose Route Frequency Provider Last Rate Last Dose  . acetaminophen (TYLENOL) tablet 650 mg  650 mg Oral Q6H PRN Shuvon Rankin, NP      . alum & mag hydroxide-simeth (MAALOX/MYLANTA) 200-200-20 MG/5ML suspension 30 mL  30 mL Oral Q4H PRN Shuvon Rankin, NP      . atazanavir (REYATAZ) capsule 300 mg  300 mg Oral Q breakfast Waylan Boga, NP   300 mg at 04/16/12 0805  . benztropine (COGENTIN) tablet 1 mg  1 mg Oral BH-qamhs Shuvon Rankin, NP   1 mg at 04/16/12 0807  . citalopram (CELEXA) tablet 40 mg  40 mg Oral Q breakfast Shuvon Rankin, NP   40 mg at 04/16/12 0807  . diphenhydrAMINE (BENADRYL) injection 50 mg  50 mg Intramuscular Once Kristain Filo      . emtricitabine-tenofovir (TRUVADA) 200-300 MG per tablet 1 tablet  1 tablet Oral Daily Waylan Boga, NP   1 tablet at 04/16/12 0806  . haloperidol (HALDOL)  tablet 2 mg  2 mg Oral BID Kisean Rollo      . haloperidol decanoate (HALDOL DECANOATE) 100 MG/ML injection 100 mg  100 mg Intramuscular Once Emmogene Simson      . magnesium hydroxide (MILK OF MAGNESIA) suspension 30 mL  30 mL Oral Daily PRN Shuvon Rankin, NP      . methocarbamol (ROBAXIN) tablet 500 mg  500 mg Oral BID Shuvon Rankin, NP   500 mg at  04/16/12 0805  . nicotine polacrilex (NICORETTE) gum 2 mg  2 mg Oral PRN Ivette Castronova   2 mg at 04/15/12 1704  . ritonavir (NORVIR) tablet 100 mg  100 mg Oral Q breakfast Waylan Boga, NP   100 mg at 04/16/12 0805  . traZODone (DESYREL) tablet 100 mg  100 mg Oral QHS PRN,MR X 1 Waylan Boga, NP   100 mg at 04/16/12 [id number]    Lab Results: No results found for this or any previous visit (from the past 48 hour(s)).  Physical Findings: AIMS: Facial and Oral Movements Muscles of Facial Expression: None, normal Lips and Perioral Area: None, normal Jaw: None, normal Tongue: None, normal,Extremity Movements Upper (arms, wrists, hands, fingers): None, normal Lower (legs, knees, ankles, toes): None, normal, Trunk Movements Neck, shoulders, hips: None, normal, Overall Severity Severity of abnormal movements (highest score from questions above): None, normal Incapacitation due to abnormal movements: None, normal Patient's awareness of abnormal movements (rate only patient's report): No Awareness, Dental Status Current problems with teeth and/or dentures?: No Does patient usually wear dentures?: No  CIWA:  CIWA-Ar Total: 3 COWS:  COWS Total Score: 1  Treatment Plan Summary: Daily contact with patient to assess and evaluate symptoms and progress in treatment  Plan: 1. Continue crisis management and stabilization. 2. Medication management to reduce current symptoms to base line and improve patient's overall level of functioning 3. Treat health problems as indicated. 4. Develop treatment plan to decrease risk of relapse upon discharge and the need for     readmission. 5. Psycho-social education regarding relapse prevention and self care. 6. Health care follow up as needed for medical problems. 7. Continue home medications where appropriate. 8.ELOS: 3-5 days. 9. Haldol decanoate 100mg  IM x 1 dose plus Benadryl 50mg  IM at 1145 am today for psychosis/non-compliant with oral medication.  Medical  Decision Making Problem Points:  Established problem, stable/improving (1) Data Points:  Review or order medicine tests (1)  I certify that inpatient services furnished can reasonably be expected to improve the patient's condition.  Corena Pilgrim, MD 10:34 AM 04/16/2012

## 2012-04-17 MED ORDER — TRAZODONE HCL 100 MG PO TABS
200.0000 mg | ORAL_TABLET | Freq: Every day | ORAL | Status: DC
  Administered 2012-04-17 – 2012-04-18 (×2): 200 mg via ORAL
  Filled 2012-04-17 (×2): qty 28
  Filled 2012-04-17 (×3): qty 2

## 2012-04-17 NOTE — BHH Group Notes (Signed)
[facility] LCSW Aftercare Discharge Planning Group Note   04/17/2012 11:25 AM  Participation Quality:  Engaged  Mood/Affect:  Flat  Depression Rating:  unknown  Anxiety Rating:  unknown  Thoughts of Suicide:  No Will you contract for safety?   NA  Current AVH:  No  Plan for Discharge/Comments:  Shelter or as of yet unidentified friend.  Wants referral to ACT team  Transportation Means: public transportation  Supports: wife  [place], Ernestine Mcmurray

## 2012-04-17 NOTE — Progress Notes (Signed)
Recreation Therapy Notes  Date: 04.09.2014  Time: 9:30am  Location: [address] Day Room   Group Topic/Focus: Memory   Participation Level:  Active   Participation Quality:  Appropriate   Affect:  Euthymic   Cognitive:  Oriented   Additional Comments: Patient with peers played a "Catch the Balloon" a game that requires patients to keep a beach ball afloat. Each participant is required to state someone in the groups name as they tap the ball to keep it afloat. The person's name who is called is then responsible for keeping the ball afloat and stating another member of the group's name. The game continues until the ball hits the ground. Patient successfully kept the ball afloat, patient remembered 100% of time to state peer or LRT name after tapping the ball to keep it afloat. Patient played adapted "Memory." Game was played in 5 rounds. For 1st three rounds LRT held 4 cards in her hand, for last two rounds LRT held 5 cards to be identified. Each patient got to view the cards for 5 seconds. As a group patients were responsible for identifying the cards that were shown to them. Patient contributed answers to help group identify cards held by LRT. Patient actively participated in group session.   Laureen Ochs Kaaren Nass, LRT/CTRS  Len Kluver L 04/17/2012 2:14 PM

## 2012-04-17 NOTE — Progress Notes (Signed)
Pt denies SI/HI. Per pt, he occasionally hear voices. Pt denies any visual hallucinations. Pt is compliant with treatment and attend groups. Pt expressed concerns about taking haldol po. Per pt, he may not remember to take meds everyday. Pt education reinforce on pt receiving haldol decanoate IM and that he will only need to take haldol po temporally.  Medications administered as ordered per MD. Verbal support given. Pt encouraged to attend groups. Verbal support given. 15 minute checks performed for safety.  Pt is receptive to treatment.

## 2012-04-17 NOTE — Progress Notes (Signed)
Adult Psychoeducational Group Note  Date:  04/17/2012 Time:  12:04 PM  Group Topic/Focus:  Crisis Planning:   The purpose of this group is to help patients create a crisis plan for use upon discharge or in the future, as needed.  Participation Level:  Minimal  Participation Quality:  Appropriate  Affect:  Appropriate  Cognitive:  Appropriate  Insight: Appropriate and Limited  Engagement in Group:  Engaged and Limited  Modes of Intervention:  Discussion, Education and Support  Additional Comments:  Pt shared by defining a crisis as "when the stuff hits the fan." Staff did not observe pt completing "Crisis Plan" worksheet. Pt was attentive and respectful during group conversation.  Arbie Cookey K 04/17/2012, 12:04 PM

## 2012-04-17 NOTE — Progress Notes (Signed)
D.  Pt. Pleasant and cooperative.  Attending groups.  Interacting appropriately with peers and staff.  Denies SI/HI and denies A/V hallucinations.  Compliant with medications.  Reports "feeling good" A. Encouragement and support given. R.  Pt. Receptive.

## 2012-04-17 NOTE — Progress Notes (Addendum)
Patient ID: Dylan [name], male   DOB: 1965-09-20, 47 y.o.   MRN: [id number] [facility] MD Progress Note  04/17/2012 1:46 PM Dylan [name]  MRN:  [id number] Subjective: "I'm a little drowsy today after the injection, but doing ok." "I would rather have a higher dose of Trazodone than to have to come back to nursing station an hour later."  Objective: Dylan [name] shows a new level of motivation to pursue improved mental health. He is waiting on approval from housing to get his own place and he will know about that tomorrow. He states no side effects from the medication other than a little drowsiness after the injection.Marland Kitchen  He reports his appetite is improving, his SI is decreasing, and the auditory hallucinations are decreasing.  Diagnosis:  Schizoaffective disorder, OD, HIV +, Cocaine abuse  ADL's:  Intact  Sleep: Fair  Appetite:  Fair  Suicidal Ideation:  denies Homicidal Ideation:  + "toward the voices I hear each day." AEB (as evidenced by): Patient's report of reduced symptoms.  Psychiatric Specialty Exam: Review of Systems  Constitutional: Negative.  Negative for fever, chills, weight loss, malaise/fatigue and diaphoresis.  HENT: Negative for congestion and sore throat.   Eyes: Negative for blurred vision, double vision and photophobia.  Respiratory: Negative for cough, shortness of breath and wheezing.   Cardiovascular: Negative for chest pain, palpitations and PND.  Gastrointestinal: Negative for heartburn, nausea, vomiting, abdominal pain, diarrhea and constipation.  Musculoskeletal: Negative for myalgias, joint pain and falls.  Neurological: Negative for dizziness, tingling, tremors, sensory change, speech change, focal weakness, seizures, loss of consciousness, weakness and headaches.  Endo/Heme/Allergies: Negative for polydipsia. Does not bruise/bleed easily.  Psychiatric/Behavioral: Positive for suicidal ideas and substance abuse. Negative for depression, hallucinations and memory  loss. The patient is nervous/anxious. The patient does not have insomnia.     Blood pressure 116/70, pulse 76, temperature 97.1 F (36.2 C), temperature source Oral, resp. rate 14.There is no weight on file to calculate BMI.  General Appearance: Casual  Eye Contact::  Fair  Speech:  Clear and Coherent  Volume:  Normal  Mood:  Depressed  Affect:  Congruent  Thought Process:  Goal Directed  Orientation:  Full (Time, Place, and Person)  Thought Content:  Paranoid Ideation but improving  Suicidal Thoughts:  denies  Homicidal Thoughts:  No   Memory:  Immediate;   Fair  Judgement:  Impaired  Insight:  Lacking  Psychomotor Activity:  Normal  Concentration:  Poor  Recall:  Poor  Akathisia:  No  Handed:  Right  AIMS (if indicated):     Assets:  Social Support  Sleep:  Number of Hours: 6.5   Current Medications: Current Facility-Administered Medications  Medication Dose Route Frequency Provider Last Rate Last Dose  . acetaminophen (TYLENOL) tablet 650 mg  650 mg Oral Q6H PRN Shuvon Rankin, NP      . alum & mag hydroxide-simeth (MAALOX/MYLANTA) 200-200-20 MG/5ML suspension 30 mL  30 mL Oral Q4H PRN Shuvon Rankin, NP   30 mL at 04/16/12 1726  . atazanavir (REYATAZ) capsule 300 mg  300 mg Oral Q breakfast Waylan Boga, NP   300 mg at 04/17/12 0743  . benztropine (COGENTIN) tablet 1 mg  1 mg Oral BH-qamhs Shuvon Rankin, NP   1 mg at 04/17/12 0744  . citalopram (CELEXA) tablet 40 mg  40 mg Oral Q breakfast Shuvon Rankin, NP   40 mg at 04/17/12 0743  . emtricitabine-tenofovir (TRUVADA) 200-300 MG per tablet 1 tablet  1  tablet Oral Daily Waylan Boga, NP   1 tablet at 04/17/12 0744  . haloperidol (HALDOL) tablet 2 mg  2 mg Oral BID Mojeed Akintayo   2 mg at 04/17/12 0744  . magnesium hydroxide (MILK OF MAGNESIA) suspension 30 mL  30 mL Oral Daily PRN Shuvon Rankin, NP      . methocarbamol (ROBAXIN) tablet 500 mg  500 mg Oral BID Shuvon Rankin, NP   500 mg at 04/17/12 0744  . nicotine polacrilex  (NICORETTE) gum 2 mg  2 mg Oral PRN Mojeed Akintayo   2 mg at 04/15/12 1704  . ritonavir (NORVIR) tablet 100 mg  100 mg Oral Q breakfast Waylan Boga, NP   100 mg at 04/17/12 0743  . traZODone (DESYREL) tablet 100 mg  100 mg Oral QHS PRN,MR X 1 Waylan Boga, NP   100 mg at 04/16/12 2213    Lab Results: No results found for this or any previous visit (from the past 48 hour(s)).  Physical Findings: AIMS: Facial and Oral Movements Muscles of Facial Expression: None, normal Lips and Perioral Area: None, normal Jaw: None, normal Tongue: None, normal,Extremity Movements Upper (arms, wrists, hands, fingers): None, normal Lower (legs, knees, ankles, toes): None, normal, Trunk Movements Neck, shoulders, hips: None, normal, Overall Severity Severity of abnormal movements (highest score from questions above): None, normal Incapacitation due to abnormal movements: None, normal Patient's awareness of abnormal movements (rate only patient's report): No Awareness, Dental Status Current problems with teeth and/or dentures?: No Does patient usually wear dentures?: No  CIWA:  CIWA-Ar Total: 3 COWS:  COWS Total Score: 1  Treatment Plan Summary: Daily contact with patient to assess and evaluate symptoms and progress in treatment  Plan: 1. Continue crisis management and stabilization. 2. Medication management to reduce current symptoms to base line and improve patient's overall level of functioning 3. Treat health problems as indicated. 4. Develop treatment plan to decrease risk of relapse upon discharge and the need for     readmission. 5. Psycho-social education regarding relapse prevention and self care. 6. Health care follow up as needed for medical problems. 7. Continue home medications where appropriate. 8.ELOS: 1-2 days barring side effects from IM antipsychotic medication. 9. Continue with the oral Haldol 2mg  at hs each day. 11. Will increase Trazodone to 200mg  w/no repeat. 10. Anticipate  d/c out on Friday.   Medical Decision Making Problem Points:  Established problem, stable/improving (1) Data Points:  Review or order medicine tests (1)  I certify that inpatient services furnished can reasonably be expected to improve the patient's condition.   Marlane Hatcher. Derrious Bologna RPAC 1:51 PM 04/17/2012  1:46 PM 04/17/2012

## 2012-04-17 NOTE — Progress Notes (Signed)
The focus of this group is to help patients review their daily goal of treatment and discuss progress on daily workbooks. Pt attended the evening group session and reported the he had a good day and attended all of his groups. Pt also stated that being allowed to go outside to the courtyard once again was a great highlight of his day. Pt stated that his short term goal was to be well enough to discharge by the end of the week. Pt smiled throughout group and appeared engaged.

## 2012-04-17 NOTE — BHH Group Notes (Signed)
[facility] LCSW Group Therapy  11/24/2011 12:00 PM  Type of Therapy:  Group Therapy  Participation Level:  Minimal  Participation Quality:  Drowsy  Affect:  Appropriate  Cognitive:  Lacking  Insight:  Limited  Engagement in Group:  Limited  Engagement in Therapy:  Limited  Modes of Intervention:  Discussion, Education, Socialization and Support  Summary of Progress/Problems: MHA: Patient was attentive during [place] speaker's presentation and sharing of personal experience with mental illness. He left group after about 20 minutes and did not return.    Smart, Heather N 11/24/2011, 12:00 PM

## 2012-04-18 MED ORDER — ALBUTEROL SULFATE HFA 108 (90 BASE) MCG/ACT IN AERS
2.0000 | INHALATION_SPRAY | Freq: Four times a day (QID) | RESPIRATORY_TRACT | Status: DC | PRN
  Administered 2012-04-18: 2 via RESPIRATORY_TRACT

## 2012-04-18 MED ORDER — ALBUTEROL SULFATE HFA 108 (90 BASE) MCG/ACT IN AERS
INHALATION_SPRAY | RESPIRATORY_TRACT | Status: AC
  Administered 2012-04-18: 16:00:00
  Filled 2012-04-18: qty 6.7

## 2012-04-18 NOTE — Progress Notes (Signed)
D:Pt reports that he has chattering voices that have decreased and his thoughts are slowing since taking haldol yesterday. He is calm and appropriate on the unit. A:Supported pt to discuss feelings. Gave medications as ordered and 15 minute checks. R:Pt denies si and hi. Safety maintained on the unit.

## 2012-04-18 NOTE — BHH Suicide Risk Assessment (Signed)
[place] INPATIENT:  Family/Significant Other Suicide Prevention Education  Suicide Prevention Education:  Contact Attempts: Dylan [name], spouse, [phone] has been identified by the patient as the family member/significant other with whom the patient will be residing, and identified as the person(s) who will aid the patient in the event of a mental health crisis.  With written consent from the patient, two attempts were made to provide suicide prevention education, prior to and/or following the patient's discharge.  We were unsuccessful in providing suicide prevention education.  A suicide education pamphlet was given to the patient to share with family/significant other.  Date and time of first attempt: 04/17/12 4:30 Date and time of second attempt: 04/18/2012 8:37 AM   Dylan [name] 04/18/2012, 8:36 AM

## 2012-04-18 NOTE — Clinical Social Work Note (Signed)
[place] Group Notes:  (Counselor/Nursing/MHT/Case Management/Adjunct)  11/23/2011 2:20 PM  Type of Therapy:  Group Therapy  Participation Level:  Active  Participation Quality:  Attentive  Affect:  Appropriate  Cognitive:  Oriented  Insight:  Improving  Engagement in Group:  Engaged  Engagement in Therapy:  Improving  Modes of Intervention:  Discussion, Exploration and Socialization  Summary of Progress/Problems: .balance: The topic for group was balance in life.  Pt participated in the discussion about when their life was in balance and out of balance and how this feels.  Pt discussed ways to get back in balance and short term goals they can work on to get where they want to be. Dylan [name] feels imbalanced when he is in conflict with others, and he deals with this by physically removing himself.  He gains balance through prayer and meditation.  He shared that his purpose in life is to serve God, and be an example.  He wants to do this by helping others, but also realizes he needs to have his own life together in order to help others.   Dylan [name] 11/23/2011, 2:20 PM

## 2012-04-18 NOTE — Progress Notes (Signed)
[facility] Adult Case Management Discharge Plan :  Will you be returning to the same living situation after discharge: Yes,  hotel At discharge, do you have transportation home?:Yes,  bus pass Do you have the ability to pay for your medications:Yes,  ACT team  Release of information consent forms completed and in the chart;  Patient's signature needed at discharge.  Patient to Follow up at: Follow-up Information   Follow up with [facility] Act team. (Your referral has been forwarded to the ACT team.  Call them on Monday to determine an appointment time for them to see you)    Contact information:   [place] [phone] and ask for ACT team, or [phone]      Patient denies SI/HI:   Yes,  yes    Safety Planning and Suicide Prevention discussed:  Yes,  yes  Roque Lias B 04/18/2012, 4:03 PM

## 2012-04-18 NOTE — BHH Group Notes (Signed)
[facility] LCSW Aftercare Discharge Planning Group Note   04/18/2012 1:28 PM  Participation Quality:  Engaged  Mood/Affect:  Flat  Depression Rating:  denies  Anxiety Rating:  denies  Thoughts of Suicide:  No Will you contract for safety?   NA  Current AVH:  No  Plan for Discharge/Comments:  Jeremmy states that he is in contact with the folks from [facility], and is hopeful that he will get into a hotel from here.  States he needs to leave in the AM, and is hopeful that he will be d/ced tomorrow.  Transportation [employer]: public transportation  Supports:  Wife, formal  [place], [place]

## 2012-04-18 NOTE — Progress Notes (Signed)
D: Patient resting in bed with eyes closed.  Respirations even and unlabored.  Patient appears to be in no apparent distress. A: Staff to monitor Q 15 mins for safety.   R:Patient remains safe on the unit.  

## 2012-04-18 NOTE — Progress Notes (Addendum)
Patient ID: Dylan [name], male   DOB: 11/29/1965, 47 y.o.   MRN: [id number] [facility] MD Progress Note  04/18/2012 11:45 AM Dylan [name]  MRN:  [id number] Subjective: "I'm doing well" he states. "No plan B" Objective: Met with Dylan [name] to discuss his response to medication and treatment. He notes that he is anxious as he is being evaluated for housing today. He does not have a back up plan in place if this should fall through. Today was made aware of Zalman' hx of being a registered sexual offender. He reported no new symptoms, but was willing to consider his options for a plan B should his application be rejected for some reason.   Diagnosis:  Schizoaffective disorder, OD, HIV +, Cocaine abuse  ADL's:  Intact  Sleep: Fair  Appetite:  Fair  Suicidal Ideation:  denies Homicidal Ideation:  denies AEB (as evidenced by): Patient's report of reduced symptoms.  Psychiatric Specialty Exam: Review of Systems  Constitutional: Negative.  Negative for fever, chills, weight loss, malaise/fatigue and diaphoresis.  HENT: Negative for congestion and sore throat.   Eyes: Negative for blurred vision, double vision and photophobia.  Respiratory: Negative for cough, shortness of breath and wheezing.   Cardiovascular: Negative for chest pain, palpitations and PND.  Gastrointestinal: Negative for heartburn, nausea, vomiting, abdominal pain, diarrhea and constipation.  Musculoskeletal: Negative for myalgias, joint pain and falls.  Neurological: Negative for dizziness, tingling, tremors, sensory change, speech change, focal weakness, seizures, loss of consciousness, weakness and headaches.  Endo/Heme/Allergies: Negative for polydipsia. Does not bruise/bleed easily.  Psychiatric/Behavioral: Positive for suicidal ideas and substance abuse. Negative for depression, hallucinations and memory loss. The patient is nervous/anxious. The patient does not have insomnia.     Blood pressure 113/67, pulse 87,  temperature 98.6 F (37 C), temperature source Oral, resp. rate 14.There is no weight on file to calculate BMI.  General Appearance: Casual  Eye Contact::  Fair  Speech:  Clear and Coherent  Volume:  Normal  Mood:  Depressed  Affect:  Congruent  Thought Process:  Goal Directed  Orientation:  Full (Time, Place, and Person)  Thought Content: linear  Suicidal Thoughts:  denies  Homicidal Thoughts:  No   Memory:  Immediate;   Fair  Judgement:  Impaired  Insight:  improving  Psychomotor Activity:  Normal  Concentration:  Poor  Recall:  Poor  Akathisia:  No  Handed:  Right  AIMS (if indicated):     Assets:  Social Support  Sleep:  Number of Hours: 6.75   Current Medications: Current Facility-Administered Medications  Medication Dose Route Frequency Provider Last Rate Last Dose  . acetaminophen (TYLENOL) tablet 650 mg  650 mg Oral Q6H PRN Shuvon Rankin, NP      . alum & mag hydroxide-simeth (MAALOX/MYLANTA) 200-200-20 MG/5ML suspension 30 mL  30 mL Oral Q4H PRN Shuvon Rankin, NP   30 mL at 04/16/12 1726  . atazanavir (REYATAZ) capsule 300 mg  300 mg Oral Q breakfast Waylan Boga, NP   300 mg at 04/17/12 0743  . benztropine (COGENTIN) tablet 1 mg  1 mg Oral BH-qamhs Shuvon Rankin, NP   1 mg at 04/17/12 0744  . citalopram (CELEXA) tablet 40 mg  40 mg Oral Q breakfast Shuvon Rankin, NP   40 mg at 04/17/12 0743  . emtricitabine-tenofovir (TRUVADA) 200-300 MG per tablet 1 tablet  1 tablet Oral Daily Waylan Boga, NP   1 tablet at 04/17/12 0744  . haloperidol (HALDOL) tablet 2 mg  2  mg Oral BID Mojeed Akintayo   2 mg at 04/17/12 0744  . magnesium hydroxide (MILK OF MAGNESIA) suspension 30 mL  30 mL Oral Daily PRN Shuvon Rankin, NP      . methocarbamol (ROBAXIN) tablet 500 mg  500 mg Oral BID Shuvon Rankin, NP   500 mg at 04/17/12 0744  . nicotine polacrilex (NICORETTE) gum 2 mg  2 mg Oral PRN Mojeed Akintayo   2 mg at 04/15/12 1704  . ritonavir (NORVIR) tablet 100 mg  100 mg Oral Q breakfast  Waylan Boga, NP   100 mg at 04/17/12 0743  . traZODone (DESYREL) tablet 100 mg  100 mg Oral QHS PRN,MR X 1 Waylan Boga, NP   100 mg at 04/16/12 2213    Lab Results: No results found for this or any previous visit (from the past 48 hour(s)).  Physical Findings: AIMS: Facial and Oral Movements Muscles of Facial Expression: None, normal Lips and Perioral Area: None, normal Jaw: None, normal Tongue: None, normal,Extremity Movements Upper (arms, wrists, hands, fingers): None, normal Lower (legs, knees, ankles, toes): None, normal, Trunk Movements Neck, shoulders, hips: None, normal, Overall Severity Severity of abnormal movements (highest score from questions above): None, normal Incapacitation due to abnormal movements: None, normal Patient's awareness of abnormal movements (rate only patient's report): No Awareness, Dental Status Current problems with teeth and/or dentures?: No Does patient usually wear dentures?: No  CIWA:  CIWA-Ar Total: 3 COWS:  COWS Total Score: 1  Treatment Plan Summary: Daily contact with patient to assess and evaluate symptoms and progress in treatment  Plan: 1. Continue crisis management and stabilization. 2. Medication management to reduce current symptoms to base line and improve patient's overall level of functioning 3. Treat health problems as indicated. 4. Develop treatment plan to decrease risk of relapse upon discharge and the need for     readmission. 5. Psycho-social education regarding relapse prevention and self care. 6. Health care follow up as needed for medical problems. 7. Continue home medications where appropriate. 8. ELOS: 1-2 days barring side effects from IM antipsychotic medication. 9. Continue with the oral Haldol 2mg  at hs each day. 10. Anticipate d/c out on Friday.   Medical Decision Making Problem Points:  Established problem, stable/improving (1) Data Points:  Review or order medicine tests (1)  I certify that inpatient  services furnished can reasonably be expected to improve the patient's condition.   Marlane Hatcher. Hanadi Stanly RPAC 11:45 AM 04/18/2012  11:45 AM 04/18/2012

## 2012-04-18 NOTE — Progress Notes (Signed)
Patient ID: Dylan [name], male   DOB: [birth year], 47 y.o.   MRN: [id number] D: pt. Pacing in the hall, reports "got to keep my engine running". Pt. Denies depression. A: Pt. Will be monitored q16min for safety. Pt. Encouraged to attend karaoke. R:Pt. Is safe on the unit. Pt. Attended karaoke.

## 2012-04-19 MED ORDER — HALOPERIDOL 2 MG PO TABS: 2.0000 mg | ORAL_TABLET | Freq: Two times a day (BID) | ORAL | Status: DC

## 2012-04-19 MED ORDER — BENZTROPINE MESYLATE 1 MG PO TABS: 1.0000 mg | ORAL_TABLET | ORAL | Status: DC

## 2012-04-19 MED ORDER — EMTRICITABINE-TENOFOVIR DF 200-300 MG PO TABS: 1.0000 | ORAL_TABLET | Freq: Every day | ORAL | Status: DC

## 2012-04-19 MED ORDER — TRAZODONE HCL 100 MG PO TABS: 200.0000 mg | ORAL_TABLET | Freq: Every day | ORAL | Status: DC

## 2012-04-19 MED ORDER — CITALOPRAM HYDROBROMIDE 40 MG PO TABS: 40.0000 mg | ORAL_TABLET | Freq: Every day | ORAL | Status: DC

## 2012-04-19 NOTE — Progress Notes (Signed)
Recreation Therapy Notes  Date: 04.11.2014 Time: 9:30am Location: [address] Day Room      Group Topic/Focus: Teamwork, Goal Setting, Memory  Participation Level: Active  Participation Quality: Appropriate  Affect: Euthymic to Flat  Cognitive: Oriented   Additional Comments: Activities: Stretching; "Palm a Bean Bag" & "Memory Sequence." Explain ation: Stretching - patients were asked to do a series of stretches. Patient were asked to stretch arms, back, and legs. Stretching took approximately 10 minutes. "Palm a Bean Bag" - Patients were asked to hold a bean bag in hand with palm facing up. Patients were asked to pass the bean bag to the peer to their left by first going over their head and behind their back. Patients were asked to set a goal for how fast they could complete the activity following each round. "Memory Sequence" - Patients were asked to create a sequence of actions for each member of the group to complete. The entire group had to complete the sequence before a new action as added on. Each patient got a turn to add to the sequence of actions. Patients participated from a seated position.    Patient activity participated in stretching exercises. Patient participated in "Palm a Bean Bag." Patient with peers and LRT were able to pass the bean bag around the group in 30 seconds. Patient with peers set goal of 25 and 20 seconds for following rounds. When LRT attempted to introduce a second bean bag patient stated he did not want to participate in activity any longer. Patient participated in "Memory Sequence." Patient with peers created the following sequence: Clap once, Clap twice, Pat top of head once, Snap, Pat tops of legs, Thumbs up, Shuffle feet back and forth and in a criss cross pattern on the floor, Place hands on knees and Salute. Patient did not need assistance to complete the sequence created. Patient was thoughtful about completing actions in the correct order.   Patient  smiled during the beginning of group session. Following male peer interaction with LRT and MHT patient affect changed to flat and depressed. Patient interacted appropriately with peers and LRT.   Laureen Ochs Ophia Shamoon, LRT/CTRS  Lane Hacker 04/19/2012 12:58 PM

## 2012-04-19 NOTE — Progress Notes (Signed)
Nrsg DC Pt was given DC AVS. HE stated he understood instructions, that he is neither homicidal and / or suicidal and this is what he writes on his AM self inventory, as well. HE is given sample meds from the pharmacy as well as prescriptions for continuing care. All belongings previously locked up are returned to him, he is escorted to the bldg entrance and given bus pass and DC'd to self care.

## 2012-04-19 NOTE — BHH Suicide Risk Assessment (Signed)
Suicide Risk Assessment  Discharge Assessment     Demographic Factors:  Male, Low socioeconomic status and Unemployed  Mental Status Per Nursing Assessment::   On Admission:  Self-harm thoughts  Current Mental Status by Physician: patient denies suicide ideation, intent or plan  Loss Factors: Decrease in vocational status, Decline in physical health and Financial problems/change in socioeconomic status  Historical Factors: Impulsivity  Risk Reduction Factors:   Positive social support and Positive therapeutic relationship  Continued Clinical Symptoms:  Resolving depression and psychosis  Cognitive Features That Contribute To Risk:  Closed-mindedness Polarized thinking    Suicide Risk:  Minimal: No identifiable suicidal ideation.  Patients presenting with no risk factors but with morbid ruminations; may be classified as minimal risk based on the severity of the depressive symptoms  Discharge Diagnoses:   AXIS I:  Schizoaffective disorder-depressed type  AXIS II:  Deferred AXIS III:   Past Medical History  Diagnosis Date  . Human immunodeficiency virus (HIV)   . Hepatitis B   . Schizophrenia   . Bipolar 1 disorder   . Arthritis   . Depression    AXIS IV:  economic problems, housing problems, other psychosocial or environmental problems and problems related to social environment AXIS V:  61-70 mild symptoms  Plan Of Care/Follow-up recommendations:  Activity:  as tolerated Diet:  healthy Tests:  routine Other:  patient to keep his after care appointment  Is patient on multiple antipsychotic therapies at discharge:  No   Has Patient had three or more failed trials of antipsychotic monotherapy by history:  No  Recommended Plan for Multiple Antipsychotic Therapies: N/A  Angelee Bahr,MD 04/19/2012, 9:49 AM

## 2012-04-19 NOTE — Discharge Summary (Signed)
Physician Discharge Summary Note  Patient:  Dylan [name] is an 47 y.o., male MRN:  [id number] DOB:  Nov 14, 1965 Patient phone:  [phone] (home)  Patient address:   [place]  [address],   Date of Admission:  04/12/2012 Date of Discharge: 04/19/2012  Reason for Admission:  Psychosis and Cocaine abuse  Discharge Diagnoses: Active Problems:   * No active hospital problems. *  Review of Systems  Constitutional: Negative.  Negative for fever, chills, weight loss, malaise/fatigue and diaphoresis.  HENT: Negative for congestion and sore throat.   Eyes: Negative for blurred vision, double vision and photophobia.  Respiratory: Negative for cough, shortness of breath and wheezing.   Cardiovascular: Negative for chest pain, palpitations and PND.  Gastrointestinal: Negative for heartburn, nausea, vomiting, abdominal pain, diarrhea and constipation.  Musculoskeletal: Negative for myalgias, joint pain and falls.  Neurological: Negative for dizziness, tingling, tremors, sensory change, speech change, focal weakness, seizures, loss of consciousness, weakness and headaches.  Endo/Heme/Allergies: Negative for polydipsia. Does not bruise/bleed easily.  Psychiatric/Behavioral: Negative for depression, suicidal ideas, hallucinations, memory loss and substance abuse. The patient is not nervous/anxious and does not have insomnia.   Discharge Diagnoses:  AXIS I: Schizoaffective disorder-depressed type  AXIS II: Deferred  AXIS III:  Past Medical History   Diagnosis  Date   .  Human immunodeficiency virus (HIV)    .  Hepatitis B    .  Schizophrenia    .  Bipolar 1 disorder    .  Arthritis    .  Depression    AXIS IV: economic problems, housing problems, other psychosocial or environmental problems and problems related to social environment  AXIS V: 61-70 mild symptoms  Level of Care:  OP  Hospital Course:  Dylan [name] was admitted for symptoms of psychosis after relapsing on  cocaine shortly after his last discharge from [facility]. He was given medical clearance and transferred to [facility] for further stabilization and therapy.     Dylan [name] is well known to [facility] staff due to his recent admission and history of multiple admissions. He has a history of schizoaffective disorder depressed type, cocaine abuse, and is HIV+.  Dylan [name] has a decreased capacity for delayed gratification and poor coping skills. He tends to use the hospital as a fall back place for meds and a bed when he has exhausted his current living situation or run out of money.  He reported to the ED reporting suicidal ideation, increased depression and auditory and visual hallucinations. His UDS was + for cocaine.  He was restarted on his usual medications and responded fairly quickly. Dylan [name] was cooperative and pleasant and not a problem to the staff. He did attend unit programming while on the unit.      Dylan [name] was waiting to hear about being accepted for section 8 housing while admitted but did not seem overly anxious about the possibility of being denied placement. He has a history of being a registered sexual offender.  He was asked to consider a plan "B" while waiting to hear the results for his application for housing as an exercise in preparedness, but seemed to get very little out of it.      On the day of discharge Dylan [name] was in stable condition and reported no SI/HI, and denied AVH. He was discharged with the following plan of care.   Consults:  None  Significant Diagnostic Studies:  labs: CBC-diff, CMP, UA, UDS  Discharge Vitals:   Blood pressure 113/67, pulse 87,  temperature 98.6 F (37 C), temperature source Oral, resp. rate 14. There is no weight on file to calculate BMI. Lab Results:   No results found for this or any previous visit (from the past 72 hour(s)).  Physical Findings:Discharge Diagnoses:  AXIS I: Schizoaffective disorder-depressed type  AXIS II: Deferred  AXIS III:  Past Medical History    Diagnosis  Date   .  Human immunodeficiency virus (HIV)    .  Hepatitis B    .  Schizophrenia    .  Bipolar 1 disorder    .  Arthritis    .  Depression    AXIS IV: economic problems, housing problems, other psychosocial or environmental problems and problems related to social environment  AXIS V: 61-70 mild symptoms  AIMS: Facial and Oral Movements Muscles of Facial Expression: None, normal Lips and Perioral Area: None, normal Jaw: None, normal Tongue: None, normal,Extremity Movements Upper (arms, wrists, hands, fingers): None, normal Lower (legs, knees, ankles, toes): None, normal, Trunk Movements Neck, shoulders, hips: None, normal, Overall Severity Severity of abnormal movements (highest score from questions above): None, normal Incapacitation due to abnormal movements: None, normal Patient's awareness of abnormal movements (rate only patient's report): No Awareness, Dental Status Current problems with teeth and/or dentures?: No Does patient usually wear dentures?: No  CIWA:  CIWA-Ar Total: 3 COWS:  COWS Total Score: 1  Psychiatric Specialty Exam: See Psychiatric Specialty Exam and Suicide Risk Assessment completed by Attending Physician prior to discharge.  Discharge destination:  Home  Is patient on multiple antipsychotic therapies at discharge:  No   Do you recommend tapering to monotherapy for antipsychotics?  No   Has Patient had three or more failed trials of antipsychotic monotherapy by history:  No  Recommended Plan for Multiple Antipsychotic Therapies: Not applicable  Discharge Orders   Future Appointments Provider Department Dept Phone   04/30/2012 3:45 [place] [id number] [facility] for Infectious Disease [phone]   Future Orders Complete By Expires     Diet - low sodium heart healthy  As directed     Discharge instructions  As directed     Comments:      Take all of your medications as directed. Be sure to keep all of your follow up  appointments.  If you are unable to keep your follow up appointment, call your Doctor's office to let them know, and reschedule.  Make sure that you have enough medication to last until your appointment. Be sure to get plenty of rest. Going to bed at the same time each night will help. Try to avoid sleeping during the day.  Increase your activity as tolerated. Regular exercise will help you to sleep better and improve your mental health. Eating a heart healthy diet is recommended. Try to avoid salty or fried foods. Be sure to avoid all alcohol and illegal drugs.    Increase activity slowly  As directed         Medication List    STOP taking these medications       HYDROcodone-acetaminophen 5-325 MG per tablet  Commonly known as:  NORCO/VICODIN     methocarbamol 500 MG tablet  Commonly known as:  ROBAXIN     QUEtiapine 50 MG Tb24  Commonly known as:  SEROQUEL XR      TAKE these medications     Indication   albuterol 108 (90 BASE) MCG/ACT inhaler  Commonly known as:  PROVENTIL HFA;VENTOLIN HFA  Inhale 2 puffs into  the lungs every 6 (six) hours as needed for wheezing or shortness of breath.      atazanavir 300 MG capsule  Commonly known as:  REYATAZ  Take 300 mg by mouth every evening. For viral infection.      benztropine 1 MG tablet  Commonly known as:  COGENTIN  Take 1 tablet (1 mg total) by mouth 2 (two) times daily in the am and at bedtime.. For EPS.   Indication:  Extrapyramidal Reaction caused by Medications     citalopram 40 MG tablet  Commonly known as:  CELEXA  Take 1 tablet (40 mg total) by mouth daily with breakfast. For depression.   Indication:  Depression     emtricitabine-tenofovir 200-300 MG per tablet  Commonly known as:  TRUVADA  Take 1 tablet by mouth daily.   Indication:  HIV Disease     haloperidol 2 MG tablet  Commonly known as:  HALDOL  Take 1 tablet (2 mg total) by mouth 2 (two) times daily. For psychosis.   Indication:  Psychosis     ritonavir  100 MG capsule  Commonly known as:  NORVIR  Take 100 mg by mouth every evening. For viral infection.      traZODone 100 MG tablet  Commonly known as:  DESYREL  Take 2 tablets (200 mg total) by mouth at bedtime. For insomnia.   Indication:  Trouble Sleeping           Follow-up Information   Follow up with [facility] Act team. (Your referral has been forwarded to the ACT team.  Call them on Monday to determine an appointment time for them to see you)    Contact information:   [place] [phone] and ask for ACT team, or [phone]      Follow-up recommendations:   Activities: Resume activity as tolerated. Diet: Heart healthy low sodium diet Tests: Follow up testing will be determined by your out patient provider.  Comments:   Total Discharge Time:  Greater than 30 minutes.  Signed: Nijah Orlich 04/19/2012, 7:51 AM

## 2012-04-19 NOTE — Tx Team (Signed)
  Interdisciplinary Treatment Plan Update   Date Reviewed:  04/19/2012  Time Reviewed:  10:35 AM  Progress in Treatment:   Attending groups: Yes Participating in groups: Yes Taking medication as prescribed: Yes  Tolerating medication: Yes Family/Significant other contact made: No  Patient understands diagnosis: Yes  As evidenced by asking for help with psychosis, depression Discussing patient identified problems/goals with staff: Yes  See initial plan Medical problems stabilized or resolved: Yes Denies suicidal/homicidal ideation: Yes  In tx team Patient has not harmed self or others: Yes  For review of initial/current patient goals, please see plan of care.  Estimated Length of Stay:  D/C today  Reason for Continuation of Hospitalization:   New Problems/Goals identified:  N/A  Discharge Plan or Barriers:   unknown  Additional Comments:    Attendees:  Signature: Corena Pilgrim, MD 04/19/2012 10:35 AM   Signature: Ripley Fraise, LCSW 04/19/2012 10:35 AM  Signature:  04/19/2012 10:35 AM  Signature:  04/19/2012 10:35 AM  Signature:  04/19/2012 10:35 AM  Signature:  04/19/2012 10:35 AM  Signature:   04/19/2012 10:35 AM  Signature:    Signature:    Signature:    Signature:    Signature:    Signature:      Scribe for Treatment Team:   [employer], LCSW  04/19/2012 10:35 AM

## 2012-04-24 NOTE — Discharge Summary (Signed)
Seen and agreed. Ivelis Norgard, MD 

## 2012-04-24 NOTE — Progress Notes (Signed)
Patient Discharge Instructions:  After Visit Summary (AVS):   Faxed to:  04/24/12 Discharge Summary Note:   Faxed to:  04/24/12 Psychiatric Admission Assessment Note:   Faxed to:  04/24/12 Suicide Risk Assessment - Discharge Assessment:   Faxed to:  04/24/12 Faxed/Sent to the Next Level Care provider:  04/24/12 Faxed to [name] [place], 04/24/2012, 3:55 PM

## 2012-04-25 ENCOUNTER — Other Ambulatory Visit: Payer: Self-pay | Admitting: *Deleted

## 2012-04-25 DIAGNOSIS — B20 Human immunodeficiency virus [HIV] disease: Secondary | ICD-10-CM

## 2012-04-25 MED ORDER — EMTRICITABINE-TENOFOVIR DF 200-300 MG PO TABS: 1.0000 | ORAL_TABLET | Freq: Every day | ORAL | Status: DC

## 2012-04-25 MED ORDER — ATAZANAVIR SULFATE 300 MG PO CAPS: 300.0000 mg | ORAL_CAPSULE | Freq: Every evening | ORAL | Status: AC

## 2012-04-25 MED ORDER — RITONAVIR 100 MG PO CAPS: 100.0000 mg | ORAL_CAPSULE | Freq: Every evening | ORAL | Status: AC

## 2012-04-30 ENCOUNTER — Ambulatory Visit: Payer: Self-pay | Admitting: Internal Medicine

## 2012-04-30 ENCOUNTER — Ambulatory Visit: Payer: Self-pay

## 2012-08-15 ENCOUNTER — Ambulatory Visit: Payer: Self-pay | Admitting: Internal Medicine

## 2012-08-27 ENCOUNTER — Ambulatory Visit: Payer: Self-pay | Admitting: Internal Medicine

## 2012-11-18 ENCOUNTER — Telehealth: Payer: Self-pay | Admitting: *Deleted

## 2012-11-18 NOTE — Telephone Encounter (Signed)
Requested pt call for lab and MD appt.  Number left.

## 2017-02-19 ENCOUNTER — Inpatient Hospital Stay (HOSPITAL_COMMUNITY)
Admission: EM | Admit: 2017-02-19 | Discharge: 2017-03-14 | DRG: 682 | Disposition: A | Discharge status: Home / Self Care | Payer: Self-pay | Attending: Internal Medicine | Admitting: Internal Medicine

## 2017-02-19 ENCOUNTER — Emergency Department (HOSPITAL_COMMUNITY): Payer: Self-pay

## 2017-02-19 ENCOUNTER — Encounter (HOSPITAL_COMMUNITY): Payer: Self-pay | Admitting: Nurse Practitioner

## 2017-02-19 ENCOUNTER — Other Ambulatory Visit: Payer: Self-pay

## 2017-02-19 DIAGNOSIS — E875 Hyperkalemia: Secondary | ICD-10-CM | POA: Diagnosis not present

## 2017-02-19 DIAGNOSIS — T82898A Other specified complication of vascular prosthetic devices, implants and grafts, initial encounter: Secondary | ICD-10-CM

## 2017-02-19 DIAGNOSIS — R7989 Other specified abnormal findings of blood chemistry: Secondary | ICD-10-CM

## 2017-02-19 DIAGNOSIS — F251 Schizoaffective disorder, depressive type: Secondary | ICD-10-CM | POA: Diagnosis present

## 2017-02-19 DIAGNOSIS — N186 End stage renal disease: Secondary | ICD-10-CM

## 2017-02-19 DIAGNOSIS — R1110 Vomiting, unspecified: Secondary | ICD-10-CM

## 2017-02-19 DIAGNOSIS — F14159 Cocaine abuse with cocaine-induced psychotic disorder, unspecified: Secondary | ICD-10-CM | POA: Diagnosis present

## 2017-02-19 DIAGNOSIS — B179 Acute viral hepatitis, unspecified: Secondary | ICD-10-CM | POA: Diagnosis present

## 2017-02-19 DIAGNOSIS — Z880 Allergy status to penicillin: Secondary | ICD-10-CM

## 2017-02-19 DIAGNOSIS — N179 Acute kidney failure, unspecified: Secondary | ICD-10-CM

## 2017-02-19 DIAGNOSIS — F250 Schizoaffective disorder, bipolar type: Secondary | ICD-10-CM

## 2017-02-19 DIAGNOSIS — F1410 Cocaine abuse, uncomplicated: Secondary | ICD-10-CM | POA: Diagnosis present

## 2017-02-19 DIAGNOSIS — J189 Pneumonia, unspecified organism: Secondary | ICD-10-CM | POA: Diagnosis not present

## 2017-02-19 DIAGNOSIS — G4700 Insomnia, unspecified: Secondary | ICD-10-CM | POA: Diagnosis present

## 2017-02-19 DIAGNOSIS — N2581 Secondary hyperparathyroidism of renal origin: Secondary | ICD-10-CM | POA: Diagnosis present

## 2017-02-19 DIAGNOSIS — T391X2A Poisoning by 4-Aminophenol derivatives, intentional self-harm, initial encounter: Secondary | ICD-10-CM | POA: Diagnosis present

## 2017-02-19 DIAGNOSIS — N170 Acute kidney failure with tubular necrosis: Principal | ICD-10-CM | POA: Diagnosis present

## 2017-02-19 DIAGNOSIS — D649 Anemia, unspecified: Secondary | ICD-10-CM | POA: Diagnosis not present

## 2017-02-19 DIAGNOSIS — R821 Myoglobinuria: Secondary | ICD-10-CM | POA: Diagnosis present

## 2017-02-19 DIAGNOSIS — R509 Fever, unspecified: Secondary | ICD-10-CM

## 2017-02-19 DIAGNOSIS — R34 Anuria and oliguria: Secondary | ICD-10-CM | POA: Diagnosis present

## 2017-02-19 DIAGNOSIS — Z915 Personal history of self-harm: Secondary | ICD-10-CM

## 2017-02-19 DIAGNOSIS — R45851 Suicidal ideations: Secondary | ICD-10-CM

## 2017-02-19 DIAGNOSIS — Z09 Encounter for follow-up examination after completed treatment for conditions other than malignant neoplasm: Secondary | ICD-10-CM

## 2017-02-19 DIAGNOSIS — R945 Abnormal results of liver function studies: Secondary | ICD-10-CM

## 2017-02-19 DIAGNOSIS — K5900 Constipation, unspecified: Secondary | ICD-10-CM | POA: Diagnosis not present

## 2017-02-19 DIAGNOSIS — I120 Hypertensive chronic kidney disease with stage 5 chronic kidney disease or end stage renal disease: Secondary | ICD-10-CM | POA: Diagnosis present

## 2017-02-19 DIAGNOSIS — R45850 Homicidal ideations: Secondary | ICD-10-CM | POA: Insufficient documentation

## 2017-02-19 DIAGNOSIS — Z91040 Latex allergy status: Secondary | ICD-10-CM

## 2017-02-19 DIAGNOSIS — T50902A Poisoning by unspecified drugs, medicaments and biological substances, intentional self-harm, initial encounter: Secondary | ICD-10-CM | POA: Diagnosis present

## 2017-02-19 DIAGNOSIS — B181 Chronic viral hepatitis B without delta-agent: Secondary | ICD-10-CM

## 2017-02-19 DIAGNOSIS — S36119A Unspecified injury of liver, initial encounter: Secondary | ICD-10-CM | POA: Diagnosis present

## 2017-02-19 DIAGNOSIS — Z590 Homelessness: Secondary | ICD-10-CM

## 2017-02-19 DIAGNOSIS — Z21 Asymptomatic human immunodeficiency virus [HIV] infection status: Secondary | ICD-10-CM | POA: Diagnosis present

## 2017-02-19 DIAGNOSIS — M6282 Rhabdomyolysis: Secondary | ICD-10-CM | POA: Diagnosis present

## 2017-02-19 DIAGNOSIS — Y95 Nosocomial condition: Secondary | ICD-10-CM | POA: Diagnosis not present

## 2017-02-19 DIAGNOSIS — Z992 Dependence on renal dialysis: Secondary | ICD-10-CM

## 2017-02-19 DIAGNOSIS — B20 Human immunodeficiency virus [HIV] disease: Secondary | ICD-10-CM | POA: Diagnosis present

## 2017-02-19 DIAGNOSIS — F17210 Nicotine dependence, cigarettes, uncomplicated: Secondary | ICD-10-CM | POA: Diagnosis present

## 2017-02-19 DIAGNOSIS — Z91048 Other nonmedicinal substance allergy status: Secondary | ICD-10-CM

## 2017-02-19 HISTORY — DX: Rhabdomyolysis: M62.82

## 2017-02-19 LAB — CBC WITH DIFFERENTIAL/PLATELET
BASOS ABS: 0 10*3/uL (ref 0.0–0.1)
BASOS PCT: 0 %
EOS ABS: 0 10*3/uL (ref 0.0–0.7)
Eosinophils Relative: 0 %
HCT: 46.8 % (ref 39.0–52.0)
HEMOGLOBIN: 15.6 g/dL (ref 13.0–17.0)
Lymphocytes Relative: 10 %
Lymphs Abs: 1.8 10*3/uL (ref 0.7–4.0)
MCH: 29.9 pg (ref 26.0–34.0)
MCHC: 33.3 g/dL (ref 30.0–36.0)
MCV: 89.7 fL (ref 78.0–100.0)
MONO ABS: 1.8 10*3/uL — AB (ref 0.1–1.0)
MONOS PCT: 10 %
NEUTROS ABS: 14.8 10*3/uL — AB (ref 1.7–7.7)
NEUTROS PCT: 80 %
Platelets: 208 10*3/uL (ref 150–400)
RBC: 5.22 MIL/uL (ref 4.22–5.81)
RDW: 13.2 % (ref 11.5–15.5)
WBC: 18.4 10*3/uL — ABNORMAL HIGH (ref 4.0–10.5)

## 2017-02-19 LAB — SALICYLATE LEVEL

## 2017-02-19 LAB — CK: Total CK: >50000 U/L — ABNORMAL HIGH (ref 49–397)

## 2017-02-19 LAB — URINALYSIS, ROUTINE W REFLEX MICROSCOPIC
BILIRUBIN URINE: NEGATIVE
Glucose, UA: 50 mg/dL — AB
KETONES UR: NEGATIVE mg/dL
LEUKOCYTES UA: NEGATIVE
Nitrite: POSITIVE — AB
PROTEIN: 100 mg/dL — AB
SPECIFIC GRAVITY, URINE: 1.024 (ref 1.005–1.030)
pH: 6 (ref 5.0–8.0)

## 2017-02-19 LAB — ETHANOL: Alcohol, Ethyl (B): <10 mg/dL (ref <10)

## 2017-02-19 LAB — RAPID URINE DRUG SCREEN, HOSP PERFORMED
Amphetamines: NOT DETECTED
Barbiturates: NOT DETECTED
Benzodiazepines: NOT DETECTED
COCAINE: POSITIVE — AB
OPIATES: NOT DETECTED
TETRAHYDROCANNABINOL: NOT DETECTED

## 2017-02-19 LAB — AMMONIA: AMMONIA: 43 umol/L — AB (ref 9–35)

## 2017-02-19 LAB — ACETAMINOPHEN LEVEL

## 2017-02-19 LAB — LIPASE, BLOOD: LIPASE: 64 U/L — AB (ref 11–51)

## 2017-02-19 MED ORDER — ALBUTEROL SULFATE HFA 108 (90 BASE) MCG/ACT IN AERS: 2.0000 | INHALATION_SPRAY | Freq: Four times a day (QID) | RESPIRATORY_TRACT | Status: DC | PRN

## 2017-02-19 MED ORDER — SODIUM CHLORIDE 0.9 % IV SOLN: Freq: Once | INTRAVENOUS | Status: DC

## 2017-02-19 MED ORDER — ALBUTEROL SULFATE (2.5 MG/3ML) 0.083% IN NEBU: 2.5000 mg | INHALATION_SOLUTION | Freq: Four times a day (QID) | RESPIRATORY_TRACT | Status: DC | PRN

## 2017-02-19 MED ORDER — STERILE WATER FOR INJECTION IV SOLN
INTRAVENOUS | Status: DC
  Administered 2017-02-19 – 2017-02-20 (×4): via INTRAVENOUS
  Filled 2017-02-19 (×11): qty 850

## 2017-02-19 MED ORDER — SODIUM CHLORIDE 0.9 % IV BOLUS (SEPSIS)
1000.0000 mL | Freq: Once | INTRAVENOUS | Status: AC
  Administered 2017-02-19: 1000 mL via INTRAVENOUS

## 2017-02-19 NOTE — ED Notes (Signed)
Pt attempted to urinate x2, unsuccessful. Patient refusing I&O cath.

## 2017-02-19 NOTE — H&P (Signed)
History and Physical    Dylan [name]:[id number] DOB: 1965-11-14 DOA: 02/19/2017  PCP: Carlyle Basques, MD  Patient coming from: Home  Chief Complaint: Suicidal thoughts for 3 weeks  HPI: Dylan [name] is a 52 y.o. male with medical history significant of HIV positive, schizophrenia comes in with 3 weeks of suicidal and homicidal ideations.  Patient reports he took a bunch of pills from his friend 2 days ago and has no idea what the pills were.  He has been doing a lot of cocaine lately.  He feels like he wants to kill people and kill himself.  He came to the emergency department today because his urine was very very dark and he was hardly urinated at all.  He denies any fevers pain nausea vomiting or diarrhea.  Patient found to have acute liver and kidney injury and is referred for admission for both.  Review of Systems: As per HPI otherwise 10 point review of systems negative.   Past Medical History:  Diagnosis Date  . Arthritis   . Bipolar 1 disorder ([place])   . Depression   . Hepatitis B   . Human immunodeficiency virus (HIV) (Dylan [name])   . Schizophrenia [facility])     Past Surgical History:  Procedure Laterality Date  . TUMOR REMOVAL     From Chest     reports that he has been smoking cigarettes.  He has a 15.00 pack-year smoking history. He uses smokeless tobacco. He reports that he uses drugs. Drug: Cocaine. He reports that he does not drink alcohol.  Allergies  Allergen Reactions  . Penicillins Other (See Comments)    Childhood allergy  . Latex Rash  . Tape Rash    History reviewed. No pertinent family history.  Prior to Admission medications   Medication Sig Start Date End Date Taking? Authorizing Provider  benztropine (COGENTIN) 1 MG tablet Take 1 tablet (1 mg total) by mouth 2 (two) times daily in the am and at bedtime.. To prevent drug induced involuntary movement 04/19/12  Yes Nwoko, Herbert Pun I, NP  bictegravir-emtricitabine-tenofovir AF (BIKTARVY) 50-200-25 MG TABS  tablet Take 1 tablet by mouth daily.   Yes [provider]  traZODone (DESYREL) 100 MG tablet Take 2 tablets (200 mg total) by mouth at bedtime. For insomnia. 04/19/12  Yes Mashburn, Marlane Hatcher, PA-C  albuterol (PROVENTIL HFA;VENTOLIN HFA) 108 (90 BASE) MCG/ACT inhaler Inhale 2 puffs into the lungs every 6 (six) hours as needed for wheezing or shortness of breath. 09/14/11 09/13/12  Readling, Milana Huntsman, MD  citalopram (CELEXA) 40 MG tablet Take 1 tablet (40 mg total) by mouth daily with breakfast. For depression. 04/19/12 04/19/13  Lindell Spar I, NP    Physical Exam: Vitals:   02/19/17 1721 02/19/17 1722 02/19/17 1738  BP: 125/78  117/66  Pulse: 92  (!) 110  Resp: 16  17  Temp: 98.5 F (36.9 C)  98.3 F (36.8 C)  TempSrc: Oral  Oral  SpO2: 99%  98%  Weight:  97.5 kg (215 lb)   Height:  6\' 2"  (1.88 m)       Constitutional: NAD, calm, comfortable Vitals:   02/19/17 1721 02/19/17 1722 02/19/17 1738  BP: 125/78  117/66  Pulse: 92  (!) 110  Resp: 16  17  Temp: 98.5 F (36.9 C)  98.3 F (36.8 C)  TempSrc: Oral  Oral  SpO2: 99%  98%  Weight:  97.5 kg (215 lb)   Height:  6\' 2"  (1.88 m)  Eyes: PERRL, lids and conjunctivae normal ENMT: Mucous membranes are moist. Posterior pharynx clear of any exudate or lesions.Normal dentition.  Neck: normal, supple, no masses, no thyromegaly Respiratory: clear to auscultation bilaterally, no wheezing, no crackles. Normal respiratory effort. No accessory muscle use.  Cardiovascular: Regular rate and rhythm, no murmurs / rubs / gallops. No extremity edema. 2+ pedal pulses. No carotid bruits.  Abdomen: no tenderness, no masses palpated. No hepatosplenomegaly. Bowel sounds positive.  Musculoskeletal: no clubbing / cyanosis. No joint deformity upper and lower extremities. Good ROM, no contractures. Normal muscle tone.  Skin: no rashes, lesions, ulcers. No induration Neurologic: CN 2-12 grossly intact. Sensation intact, DTR normal. Strength 5/5 in all  4.  Psychiatric: Normal judgment and insight. Alert and oriented x 3. Normal mood.    Labs on Admission: I have personally reviewed following labs and imaging studies  CBC: Recent Labs  Lab 02/19/17 1742  WBC 18.4*  NEUTROABS 14.8*  HGB 15.6  HCT 46.8  MCV 89.7  PLT 324   Basic Metabolic Panel: Recent Labs  Lab 02/19/17 1742  NA 134*  K 4.9  CL 96*  CO2 18*  GLUCOSE 90  BUN 81*  CREATININE 4.30*  CALCIUM 7.8*   GFR: Estimated Creatinine Clearance: 23.6 mL/min (A) (by C-G formula based on SCr of 4.3 mg/dL (H)). Liver Function Tests: Recent Labs  Lab 02/19/17 1742  AST 2,317*  ALT 479*  ALKPHOS 100  BILITOT 1.1  PROT 8.4*  ALBUMIN 4.5   Recent Labs  Lab 02/19/17 1847  LIPASE 64*   Recent Labs  Lab 02/19/17 2025  AMMONIA 43*   Coagulation Profile: No results for input(s): INR, PROTIME in the last 168 hours. Cardiac Enzymes: Recent Labs  Lab 02/19/17 1742  CKTOTAL >50,000*   BNP (last 3 results) No results for input(s): PROBNP in the last 8760 hours. HbA1C: No results for input(s): HGBA1C in the last 72 hours. CBG: No results for input(s): GLUCAP in the last 168 hours. Lipid Profile: No results for input(s): CHOL, HDL, LDLCALC, TRIG, CHOLHDL, LDLDIRECT in the last 72 hours. Thyroid Function Tests: No results for input(s): TSH, T4TOTAL, FREET4, T3FREE, THYROIDAB in the last 72 hours. Anemia Panel: No results for input(s): VITAMINB12, FOLATE, FERRITIN, TIBC, IRON, RETICCTPCT in the last 72 hours. Urine analysis:    Component Value Date/Time   COLORURINE AMBER (A) 02/19/2017 1742   APPEARANCEUR CLOUDY (A) 02/19/2017 1742   LABSPEC 1.024 02/19/2017 1742   PHURINE 6.0 02/19/2017 1742   GLUCOSEU 50 (A) 02/19/2017 1742   HGBUR MODERATE (A) 02/19/2017 1742   BILIRUBINUR NEGATIVE 02/19/2017 1742   KETONESUR NEGATIVE 02/19/2017 1742   PROTEINUR 100 (A) 02/19/2017 1742   NITRITE POSITIVE (A) 02/19/2017 1742   LEUKOCYTESUR NEGATIVE 02/19/2017 1742    Sepsis Labs: !!!!!!!!!!!!!!!!!!!!!!!!!!!!!!!!!!!!!!!!!!!! @LABRCNTIP (procalcitonin:4,lacticidven:4) )No results found for this or any previous visit (from the past 240 hour(s)).   Radiological Exams on Admission: Dg Chest 2 View  Result Date: 02/19/2017 CLINICAL DATA:  52 year old male with a history of renal failure and leukocytosis EXAM: CHEST  2 VIEW COMPARISON:  04/21/2012 FINDINGS: Cardiomediastinal silhouette unchanged in size and contour. No evidence of central vascular congestion. No pneumothorax or pleural effusion. No confluent airspace disease. Coarsened interstitial markings. No displaced fracture IMPRESSION: Chronic lung changes without evidence of superimposed acute cardiopulmonary disease. Electronically Signed   By: Corrie Mckusick D.O.   On: 02/19/2017 21:06   Ct Head Wo Contrast  Result Date: 02/19/2017 CLINICAL DATA:  52 year old male with altered mental  status. Suicidal ideation. HIV. EXAM: CT HEAD WITHOUT CONTRAST TECHNIQUE: Contiguous axial images were obtained from the base of the skull through the vertex without intravenous contrast. COMPARISON:  Report of noncontrast head CT 06/28/2000 (no images available). FINDINGS: Brain: Cerebral volume is within normal limits. No midline shift, ventriculomegaly, mass effect, evidence of mass lesion, intracranial hemorrhage or evidence of cortically based acute infarction. Gray-white matter differentiation is within normal limits throughout the brain. Vascular: No suspicious intracranial vascular hyperdensity. Skull: Chronic left lamina papyracea fracture. No acute osseous abnormality identified. Sinuses/Orbits: Clear, aside from left ethmoid effacement due to the chronic lamina papyracea fracture. Other: Negative visible orbit and scalp soft tissues. IMPRESSION: Negative noncontrast head CT. Electronically Signed   By: Genevie Ann M.D.   On: 02/19/2017 20:59    Old chart reviewed Case discussed with EDP Chest x-ray reviewed personally no  edema or infiltrate  Assessment/Plan 52 year old male with suicidal and homicidal ideations comes in with drug overdose causing both acute kidney injury and liver injury Principal Problem:   AKI (acute kidney injury) (HCC)-CPK levels over 50,000 along with a creatinine up to 4.5.  Normal baseline creatinine of around 1.  Bicarb drip ordered by nephrology.  Obtain nephrology consult who will see him in the morning.  Measure urinary output closely.  Patient currently not uremic.  Active Problems:   Rhabdomyolysis-CPK level over 50,000.  Serial CPK levels every 8 hours.  Aggressive IV fluids as above with bicarb.   Suicidal overdose (HCC)-patient has a Actuary and is involuntarily committed   Acute hepatitis-AST and ALT also elevated at 2317 and 479.  Likely from ingestion from 2 days ago of unknown substances.  Repeat levels in the morning.   HIV disease (HCC)-noted clarify home meds   Schizoaffective disorder, depressive type (HCC)-noted   Cocaine abuse (HCC)-check urine drug screen    DVT prophylaxis: SCDs Code Status: Full Family Communication: None Disposition Plan: Per day team Consults called: Nephrology Admission status: Admission   DAVID,RACHAL A MD [facility]  If 7PM-7AM, please contact night-coverage [email or web address] Password [facility]  02/19/2017, 10:24 PM

## 2017-02-19 NOTE — ED Provider Notes (Signed)
[facility] DEPT Provider Note   CSN: [id number] Arrival date & time: 02/19/17  1705     History   Chief Complaint No chief complaint on file.   HPI Dylan [name] is a 52 y.o. male.  HPI  Dylan [name] is a 53 y.o. male history of bipolar disorder, depression, HIV disease, hepatitis B, schizophrenia, presents to emergency department with suicidal at home with complaint of dark colored urine.  Patient states that he has been feeling suicidal homicidal for about a week.  He refuses to elaborate any further on his plan.  He states he is taking some medications for his depression and currently compliant with his medicines.  He went to [facility] today and was sent here for further evaluation.  He states he took "a whole bunch of pills" 3 days ago.  He is unable to tell me what pills they were, he states he got them from a friend.  Here he admits to doing cocaine, last cocaine use yesterday.  He also admits full compliance with his HIV medications.  Admits that he just got out of jail 2 weeks ago, he was in prison for 3 years.  He states while in prison he has been receiving all of his medications and on every 6 months.  He has no other complaints.  Patient is a poor historian   Past Medical History:  Diagnosis Date  . Arthritis   . Bipolar 1 disorder ([place])   . Depression   . Hepatitis B   . Human immunodeficiency virus (HIV) ([place])   . Schizophrenia [facility])     Patient Active Problem List   Diagnosis Date Noted  . Schizoaffective disorder, depressive type ([place]) 09/05/2011  . Cocaine abuse ([place]) 09/05/2011  . Degenerative disc disease 08/21/2011  . HIV disease ([place]) 08/17/2011    Past Surgical History:  Procedure Laterality Date  . TUMOR REMOVAL     From Chest       Home Medications    Prior to Admission medications   Medication Sig Start Date End Date Taking? Authorizing Provider  albuterol (PROVENTIL HFA;VENTOLIN HFA) 108 (90 BASE) MCG/ACT  inhaler Inhale 2 puffs into the lungs every 6 (six) hours as needed for wheezing or shortness of breath. 09/14/11 09/13/12  Readling, Milana Huntsman, MD  benztropine (COGENTIN) 1 MG tablet Take 1 tablet (1 mg total) by mouth 2 (two) times daily in the am and at bedtime.. To prevent drug induced involuntary movement 04/19/12   Lindell Spar I, NP  citalopram (CELEXA) 40 MG tablet Take 1 tablet (40 mg total) by mouth daily with breakfast. For depression. 04/19/12 04/19/13  Lindell Spar I, NP  emtricitabine-tenofovir (TRUVADA) 200-300 MG per tablet Take 1 tablet by mouth daily. 04/25/12   Carlyle Basques, MD  haloperidol (HALDOL) 2 MG tablet Take 1 tablet (2 mg total) by mouth 2 (two) times daily. For mood control 04/19/12   Lindell Spar I, NP  traZODone (DESYREL) 100 MG tablet Take 2 tablets (200 mg total) by mouth at bedtime. For insomnia. 04/19/12   Ruben Im PA-C    Family History History reviewed. No pertinent family history.  Social History Social History   Tobacco Use  . Smoking status: Current Every Day Smoker    Packs/day: 1.00    Years: 15.00    Pack years: 15.00    Types: Cigarettes  . Smokeless tobacco: Current User  Substance Use Topics  . Alcohol use: No    Alcohol/week: 0.0  oz    Comment: past  . Drug use: Yes    Types: Cocaine    Comment: past     Allergies   Penicillins; Latex; and Tape   Review of Systems Review of Systems   Physical Exam Updated Vital Signs BP 117/66 (BP Location: Left Arm)   Pulse (!) 110   Temp 98.3 F (36.8 C) (Oral)   Resp 17   Ht 6\' 2"  (1.88 m)   Wt 97.5 kg (215 lb)   SpO2 98%   BMI 27.60 kg/m   Physical Exam   ED Treatments / Results  Labs (all labs ordered are listed, but only abnormal results are displayed) Labs Reviewed  CBC WITH DIFFERENTIAL/PLATELET - Abnormal; Notable for the following components:      Result Value   WBC 18.4 (*)    Neutro Abs 14.8 (*)    Monocytes Absolute 1.8 (*)    All other components within normal  limits  COMPREHENSIVE METABOLIC PANEL - Abnormal; Notable for the following components:   Sodium 134 (*)    Chloride 96 (*)    CO2 18 (*)    BUN 81 (*)    Creatinine, Ser 4.30 (*)    Calcium 7.8 (*)    Total Protein 8.4 (*)    AST 2,317 (*)    ALT 479 (*)    GFR calc non Af Amer 15 (*)    GFR calc Af Amer 17 (*)    Anion gap 20 (*)    All other components within normal limits  URINALYSIS, ROUTINE W REFLEX MICROSCOPIC - Abnormal; Notable for the following components:   Color, Urine AMBER (*)    APPearance CLOUDY (*)    Glucose, UA 50 (*)    Hgb urine dipstick MODERATE (*)    Protein, ur 100 (*)    Nitrite POSITIVE (*)    Bacteria, UA RARE (*)    Squamous Epithelial / LPF 0-5 (*)    All other components within normal limits  RAPID URINE DRUG SCREEN, HOSP PERFORMED - Abnormal; Notable for the following components:   Cocaine POSITIVE (*)    All other components within normal limits  ACETAMINOPHEN LEVEL - Abnormal; Notable for the following components:   Acetaminophen (Tylenol), Serum <10 (*)    All other components within normal limits  AMMONIA - Abnormal; Notable for the following components:   Ammonia 43 (*)    All other components within normal limits  CK - Abnormal; Notable for the following components:   Total CK >50,000 (*)    All other components within normal limits  LIPASE, BLOOD - Abnormal; Notable for the following components:   Lipase 64 (*)    All other components within normal limits  ETHANOL  SALICYLATE LEVEL  COMPREHENSIVE METABOLIC PANEL  CBC  PROTIME-INR  CK  CK    EKG ED ECG REPORT   Date: 02/19/2017  Rate: 93  Rhythm: normal sinus rhythm  QRS Axis: normal  Intervals: normal  ST/T Wave abnormalities: normal  Conduction Disutrbances:none  Narrative Interpretation:   Old EKG Reviewed: none available  I have personally reviewed the EKG tracing and agree with the computerized printout as noted.  Radiology Dg Chest 2 View  Result Date:  02/19/2017 CLINICAL DATA:  52 year old male with a history of renal failure and leukocytosis EXAM: CHEST  2 VIEW COMPARISON:  04/21/2012 FINDINGS: Cardiomediastinal silhouette unchanged in size and contour. No evidence of central vascular congestion. No pneumothorax or pleural effusion. No confluent airspace  disease. Coarsened interstitial markings. No displaced fracture IMPRESSION: Chronic lung changes without evidence of superimposed acute cardiopulmonary disease. Electronically Signed   By: Corrie Mckusick D.O.   On: 02/19/2017 21:06   Ct Head Wo Contrast  Result Date: 02/19/2017 CLINICAL DATA:  52 year old male with altered mental status. Suicidal ideation. HIV. EXAM: CT HEAD WITHOUT CONTRAST TECHNIQUE: Contiguous axial images were obtained from the base of the skull through the vertex without intravenous contrast. COMPARISON:  Report of noncontrast head CT 06/28/2000 (no images available). FINDINGS: Brain: Cerebral volume is within normal limits. No midline shift, ventriculomegaly, mass effect, evidence of mass lesion, intracranial hemorrhage or evidence of cortically based acute infarction. Gray-white matter differentiation is within normal limits throughout the brain. Vascular: No suspicious intracranial vascular hyperdensity. Skull: Chronic left lamina papyracea fracture. No acute osseous abnormality identified. Sinuses/Orbits: Clear, aside from left ethmoid effacement due to the chronic lamina papyracea fracture. Other: Negative visible orbit and scalp soft tissues. IMPRESSION: Negative noncontrast head CT. Electronically Signed   By: Genevie Ann M.D.   On: 02/19/2017 20:59    Procedures Procedures (including critical care time)  Medications Ordered in ED Medications - No data to display   Initial Impression / Assessment and Plan / ED Course  I have reviewed the triage vital signs and the nursing notes.  Pertinent labs & imaging results that were available during my care of the patient were  reviewed by me and considered in my medical decision making (see chart for details).     Patient in emergency department with complaint of dark colored urine as well as suicidal homicidal ideations.  He apparently just got out of prison 2 weeks ago.  He has been using cocaine since then.  Denies alcohol use.  He would not admit to me his plan of suicide and refuses to tell me who he has homicidal ideations towards.  He reports full compliance with his HIV meds.  I will check urinalysis and basic labs.  8:30 PM Patient's blood work showed creatinine of 4.3, his last creatinine was normal but that was 5 years ago.  He is last blood tests that we have in his system are all from 5 years ago.  He is LFTs are elevated today.  I will add salicylate and Tylenol levels.  We will add CK level and ammonia level.  Will start IV fluids.  Urine still pending  10:06 PM CK greater than 50,000. Spoke with medicine.  To speak with nephrology regarding patient's AK I and rhabdomyolysis.  I spoke with Dr. Jimmy Footman, with nephrology, who recommended starting patient on isotonic bicarb solution, at 250 an hour, watch urine output carefully, they will consult patient in the morning.  Okay to stay at [facility].  I spoke with admitting hospitalist again, and discussed recommendations.  Vitals:   02/19/17 1721 02/19/17 1722 02/19/17 1738  BP: 125/78  117/66  Pulse: 92  (!) 110  Resp: 16  17  Temp: 98.5 F (36.9 C)  98.3 F (36.8 C)  TempSrc: Oral  Oral  SpO2: 99%  98%  Weight:  97.5 kg (215 lb)   Height:  6\' 2"  (1.88 m)      Final Clinical Impressions(s) / ED Diagnoses   Final diagnoses:  Non-traumatic rhabdomyolysis  Acute kidney injury ([place])  Elevated LFTs  Suicidal ideations  Homicidal ideations    ED Discharge Orders    None       Jeannett Senior, PA-C 02/19/17 2322    Duffy Bruce,  MD 02/20/17 0000

## 2017-02-19 NOTE — ED Triage Notes (Signed)
Patient brought in by EMS from [facility] for SI and HI. [facility] sent him to be evaluated due to patient stated he took a bunch of pills Friday that he has no clue what they were and he has done cocaine yesterday.

## 2017-02-19 NOTE — ED Notes (Addendum)
Spoke with Nunzio Cory in the lab to add on acetaminophen, salicylate and CK levels

## 2017-02-19 NOTE — ED Notes (Signed)
Patient transported to X-ray 

## 2017-02-20 ENCOUNTER — Other Ambulatory Visit: Payer: Self-pay

## 2017-02-20 ENCOUNTER — Inpatient Hospital Stay (HOSPITAL_COMMUNITY): Payer: Self-pay

## 2017-02-20 LAB — COMPREHENSIVE METABOLIC PANEL
ALBUMIN: 4.5 g/dL (ref 3.5–5.0)
ALBUMIN: 3.2 g/dL — AB (ref 3.5–5.0)
ALK PHOS: 68 U/L (ref 38–126)
ALT: 479 U/L — ABNORMAL HIGH (ref 17–63)
ALT: 359 U/L — ABNORMAL HIGH (ref 17–63)
ANION GAP: 20 — AB (ref 5–15)
AST: 2317 U/L — AB (ref 15–41)
AST: 1449 U/L — AB (ref 15–41)
Alkaline Phosphatase: 100 U/L (ref 38–126)
Anion gap: 17 — ABNORMAL HIGH (ref 5–15)
BILIRUBIN TOTAL: 1.1 mg/dL (ref 0.3–1.2)
BILIRUBIN TOTAL: 0.6 mg/dL (ref 0.3–1.2)
BUN: 81 mg/dL — AB (ref 6–20)
BUN: 53 mg/dL — AB (ref 6–20)
CALCIUM: 6.8 mg/dL — AB (ref 8.9–10.3)
CHLORIDE: 96 mmol/L — AB (ref 101–111)
CO2: 18 mmol/L — AB (ref 22–32)
CO2: 22 mmol/L (ref 22–32)
CREATININE: 5.29 mg/dL — AB (ref 0.61–1.24)
Calcium: 7.8 mg/dL — ABNORMAL LOW (ref 8.9–10.3)
Chloride: 95 mmol/L — ABNORMAL LOW (ref 101–111)
Creatinine, Ser: 4.3 mg/dL — ABNORMAL HIGH (ref 0.61–1.24)
GFR calc Af Amer: 17 mL/min — ABNORMAL LOW (ref >60)
GFR calc Af Amer: 13 mL/min — ABNORMAL LOW (ref >60)
GFR calc non Af Amer: 15 mL/min — ABNORMAL LOW (ref >60)
GFR calc non Af Amer: 11 mL/min — ABNORMAL LOW (ref >60)
GLUCOSE: 120 mg/dL — AB (ref 65–99)
Glucose, Bld: 90 mg/dL (ref 65–99)
POTASSIUM: 4.9 mmol/L (ref 3.5–5.1)
Potassium: 3.8 mmol/L (ref 3.5–5.1)
SODIUM: 134 mmol/L — AB (ref 135–145)
Sodium: 134 mmol/L — ABNORMAL LOW (ref 135–145)
TOTAL PROTEIN: 6.1 g/dL — AB (ref 6.5–8.1)
Total Protein: 8.4 g/dL — ABNORMAL HIGH (ref 6.5–8.1)

## 2017-02-20 LAB — RENAL FUNCTION PANEL
Albumin: 2.8 g/dL — ABNORMAL LOW (ref 3.5–5.0)
Anion gap: 17 — ABNORMAL HIGH (ref 5–15)
BUN: 117 mg/dL — ABNORMAL HIGH (ref 6–20)
CHLORIDE: 85 mmol/L — AB (ref 101–111)
CO2: 30 mmol/L (ref 22–32)
Calcium: 6.6 mg/dL — ABNORMAL LOW (ref 8.9–10.3)
Creatinine, Ser: 7.06 mg/dL — ABNORMAL HIGH (ref 0.61–1.24)
GFR calc Af Amer: 9 mL/min — ABNORMAL LOW (ref >60)
GFR, EST NON AFRICAN AMERICAN: 8 mL/min — AB (ref >60)
Glucose, Bld: 110 mg/dL — ABNORMAL HIGH (ref 65–99)
POTASSIUM: 3.4 mmol/L — AB (ref 3.5–5.1)
Phosphorus: 6.1 mg/dL — ABNORMAL HIGH (ref 2.5–4.6)
Sodium: 132 mmol/L — ABNORMAL LOW (ref 135–145)

## 2017-02-20 LAB — PROTIME-INR
INR: 1.02
PROTHROMBIN TIME: 13.3 s (ref 11.4–15.2)

## 2017-02-20 LAB — SODIUM, URINE, RANDOM: Sodium, Ur: 19 mmol/L

## 2017-02-20 LAB — CK
Total CK: >50000 U/L — ABNORMAL HIGH (ref 49–397)
Total CK: >50000 U/L — ABNORMAL HIGH (ref 49–397)

## 2017-02-20 LAB — URINALYSIS, ROUTINE W REFLEX MICROSCOPIC
Bilirubin Urine: NEGATIVE
GLUCOSE, UA: 50 mg/dL — AB
KETONES UR: NEGATIVE mg/dL
Leukocytes, UA: NEGATIVE
Nitrite: NEGATIVE
PROTEIN: 100 mg/dL — AB
Specific Gravity, Urine: 1.02 (ref 1.005–1.030)
pH: 5 (ref 5.0–8.0)

## 2017-02-20 LAB — CBC
HCT: 37.1 % — ABNORMAL LOW (ref 39.0–52.0)
Hemoglobin: 13.1 g/dL (ref 13.0–17.0)
MCH: 31.2 pg (ref 26.0–34.0)
MCHC: 35.3 g/dL (ref 30.0–36.0)
MCV: 88.3 fL (ref 78.0–100.0)
Platelets: 142 10*3/uL — ABNORMAL LOW (ref 150–400)
RBC: 4.2 MIL/uL — ABNORMAL LOW (ref 4.22–5.81)
RDW: 13.1 % (ref 11.5–15.5)
WBC: 12.6 10*3/uL — ABNORMAL HIGH (ref 4.0–10.5)

## 2017-02-20 LAB — MAGNESIUM: Magnesium: 2.8 mg/dL — ABNORMAL HIGH (ref 1.7–2.4)

## 2017-02-20 LAB — GLUCOSE, CAPILLARY
GLUCOSE-CAPILLARY: 107 mg/dL — AB (ref 65–99)
Glucose-Capillary: 80 mg/dL (ref 65–99)

## 2017-02-20 MED ORDER — BICTEGRAVIR-EMTRICITAB-TENOFOV 50-200-25 MG PO TABS
1.0000 | ORAL_TABLET | Freq: Every day | ORAL | Status: DC
  Administered 2017-02-20 – 2017-02-21 (×2): 1 via ORAL
  Filled 2017-02-20 (×2): qty 1

## 2017-02-20 MED ORDER — HEPARIN SODIUM (PORCINE) 5000 UNIT/ML IJ SOLN
5000.0000 [IU] | Freq: Three times a day (TID) | INTRAMUSCULAR | Status: DC
  Administered 2017-02-20 – 2017-02-21 (×4): 5000 [IU] via SUBCUTANEOUS
  Filled 2017-02-20 (×2): qty 1

## 2017-02-20 MED ORDER — STERILE WATER FOR INJECTION IV SOLN
INTRAVENOUS | Status: DC
  Administered 2017-02-20: 22:00:00 via INTRAVENOUS
  Filled 2017-02-20 (×2): qty 850

## 2017-02-20 NOTE — Progress Notes (Addendum)
PROGRESS NOTE  Dylan [name]  HEN:[id number] DOB: Nov 13, 1965 DOA: 02/19/2017 PCP: Carlyle Basques, MD  Brief Narrative:   The patient is a 52 year old male with history of HIV, schizophrenia who presented with a 3-week history of suicidal and homicidal ideation.  He took a bunch of pills for friend 2 days prior to admission and had been doing cocaine.  He felt like he wanted to kill himself and other people.  He came to the emergency department because his urine was very dark and he had not been making much urine.  He was found to have rhabdomyolysis with acute renal failure.  He was started on IV fluids but his creatinine continued to rise and he was essentially anuric.  He is being transferred to [facility] for hemodialysis.  Assessment & Plan:  AKI (acute kidney injury) ([place]) secondary to rhabdomyolysis.  CPK levels consistently over 50,000.  Patient is anuric and creatinine is rising -  Continue bicarb drip -  Daily BMP and CPK -  Strict I/O  -  RUS: no hydronephrosis -  Transfer to [facility] for hemodialysis     Suicidal overdose (HCC)-patient has a Actuary and is involuntarily committed -  Psychiatry consultation  Transaminitis likely due to muscle injury/rhabdo.  Trending down with IVF.   - tylenol and ASA levels negative  HIV disease ([place])- states he had been compliant with his HIV medications -  Biktarvy:  Although uptodate states contraindicated in renal failure, per Dr. [name], those doses of tenofovir have been studied in other drugs/conditions and are safe.  He recommended continuing Biktarvy but if there were concerns, please contact ID to construct another regimen.   Schizoaffective disorder, depressive type ([place]) -  Holding psychiatric medications pending psychiatry evaluation  Cocaine abuse ([place]), UDS positive  DVT prophylaxis:  heparin Code Status:  Full code Family Communication:  Patient alone Disposition Plan:  Transfer to [facility] for  HD   Consultants:   Nephrology, Dr. Justin Mend  Procedures:  none  Antimicrobials:  Anti-infectives (From admission, onward)   None       Subjective:  Has some muscle tenderness just above his knees and his quadriceps.  He has had a mild cough below a little bit of shortness of breath but otherwise he has been feeling okay.  He ate dinner last night and breakfast again this morning.  He has not made any urine this morning.  His urine has been very dark for several days.  Objective: Vitals:   02/20/17 0100 02/20/17 0131 02/20/17 0200 02/20/17 0258  BP: 124/82 111/75 (!) 97/59 106/69  Pulse: 87 100 84 86  Resp: (!) 23 (!) 21 19 16   Temp:    98.1 F (36.7 C)  TempSrc:    Oral  SpO2: 96% 96% 100% 96%  Weight:    101.1 kg (222 lb 14.2 oz)  Height:    6\' 2"  (1.88 m)    Intake/Output Summary (Last 24 hours) at 02/20/2017 1435 Last data filed at 02/20/2017 0600 Gross per 24 hour  Intake 4693.33 ml  Output 32 ml  Net 4661.33 ml   Filed Weights   02/19/17 1722 02/20/17 0258  Weight: 97.5 kg (215 lb) 101.1 kg (222 lb 14.2 oz)    Examination:  General exam:  Adult male.  No acute distress.  HEENT:  NCAT, MMM Respiratory system: Clear to auscultation bilaterally Cardiovascular system: Regular rate and rhythm, normal S1/S2. No murmurs, rubs, gallops or clicks.  Warm extremities Gastrointestinal  system: Normal active bowel sounds, soft, nondistended, nontender. MSK:  Normal tone and bulk, no lower extremity edema.  Mild TTP just above the knees on quads bilaterally Neuro:  Grossly intact    Data Reviewed: I have personally reviewed following labs and imaging studies  CBC: Recent Labs  Lab 02/19/17 1742 02/20/17 0425  WBC 18.4* 12.6*  NEUTROABS 14.8*  --   HGB 15.6 13.1  HCT 46.8 37.1*  MCV 89.7 88.3  PLT 208 785*   Basic Metabolic Panel: Recent Labs  Lab 02/19/17 1742 02/20/17 0425  NA 134* 134*  K 4.9 3.8  CL 96* 95*  CO2 18* 22  GLUCOSE 90 120*  BUN 81*  53*  CREATININE 4.30* 5.29*  CALCIUM 7.8* 6.8*  MG  --  2.8*   GFR: Estimated Creatinine Clearance: 21 mL/min (A) (by C-G formula based on SCr of 5.29 mg/dL (H)). Liver Function Tests: Recent Labs  Lab 02/19/17 1742 02/20/17 0425  AST 2,317* 1,449*  ALT 479* 359*  ALKPHOS 100 68  BILITOT 1.1 0.6  PROT 8.4* 6.1*  ALBUMIN 4.5 3.2*   Recent Labs  Lab 02/19/17 1847  LIPASE 64*   Recent Labs  Lab 02/19/17 2025  AMMONIA 43*   Coagulation Profile: Recent Labs  Lab 02/20/17 0425  INR 1.02   Cardiac Enzymes: Recent Labs  Lab 02/19/17 1742 02/19/17 2323 02/20/17 0425  CKTOTAL >50,000* >50,000* >50,000*   BNP (last 3 results) No results for input(s): PROBNP in the last 8760 hours. HbA1C: No results for input(s): HGBA1C in the last 72 hours. CBG: Recent Labs  Lab 02/20/17 0730 02/20/17 1208  GLUCAP 107* 80   Lipid Profile: No results for input(s): CHOL, HDL, LDLCALC, TRIG, CHOLHDL, LDLDIRECT in the last 72 hours. Thyroid Function Tests: No results for input(s): TSH, T4TOTAL, FREET4, T3FREE, THYROIDAB in the last 72 hours. Anemia Panel: No results for input(s): VITAMINB12, FOLATE, FERRITIN, TIBC, IRON, RETICCTPCT in the last 72 hours. Urine analysis:    Component Value Date/Time   COLORURINE AMBER (A) 02/20/2017 1100   APPEARANCEUR HAZY (A) 02/20/2017 1100   LABSPEC 1.020 02/20/2017 1100   PHURINE 5.0 02/20/2017 1100   GLUCOSEU 50 (A) 02/20/2017 1100   HGBUR LARGE (A) 02/20/2017 1100   BILIRUBINUR NEGATIVE 02/20/2017 1100   KETONESUR NEGATIVE 02/20/2017 1100   PROTEINUR 100 (A) 02/20/2017 1100   NITRITE NEGATIVE 02/20/2017 1100   LEUKOCYTESUR NEGATIVE 02/20/2017 1100   Sepsis Labs: @LABRCNTIP (procalcitonin:4,lacticidven:4)  )No results found for this or any previous visit (from the past 240 hour(s)).    Radiology Studies: Dg Chest 2 View  Result Date: 02/19/2017 CLINICAL DATA:  52 year old male with a history of renal failure and leukocytosis  EXAM: CHEST  2 VIEW COMPARISON:  04/21/2012 FINDINGS: Cardiomediastinal silhouette unchanged in size and contour. No evidence of central vascular congestion. No pneumothorax or pleural effusion. No confluent airspace disease. Coarsened interstitial markings. No displaced fracture IMPRESSION: Chronic lung changes without evidence of superimposed acute cardiopulmonary disease. Electronically Signed   By: Corrie Mckusick D.O.   On: 02/19/2017 21:06   Ct Head Wo Contrast  Result Date: 02/19/2017 CLINICAL DATA:  52 year old male with altered mental status. Suicidal ideation. HIV. EXAM: CT HEAD WITHOUT CONTRAST TECHNIQUE: Contiguous axial images were obtained from the base of the skull through the vertex without intravenous contrast. COMPARISON:  Report of noncontrast head CT 06/28/2000 (no images available). FINDINGS: Brain: Cerebral volume is within normal limits. No midline shift, ventriculomegaly, mass effect, evidence of mass lesion, intracranial hemorrhage or  evidence of cortically based acute infarction. Gray-white matter differentiation is within normal limits throughout the brain. Vascular: No suspicious intracranial vascular hyperdensity. Skull: Chronic left lamina papyracea fracture. No acute osseous abnormality identified. Sinuses/Orbits: Clear, aside from left ethmoid effacement due to the chronic lamina papyracea fracture. Other: Negative visible orbit and scalp soft tissues. IMPRESSION: Negative noncontrast head CT. Electronically Signed   By: Genevie Ann M.D.   On: 02/19/2017 20:59   US Renal  Result Date: 02/20/2017 CLINICAL DATA:  Acute renal failure. EXAM: RENAL / URINARY TRACT ULTRASOUND COMPLETE COMPARISON:  No recent prior. FINDINGS: Right Kidney: Length: 12.7 cm. Echogenicity within normal limits. No mass or hydronephrosis visualized. Left Kidney: Length: 12.8 cm. Echogenicity within normal limits. No mass or hydronephrosis visualized. Bladder: Foley catheter in the bladder.  Bladder is  decompressed. IMPRESSION: No acute or focal abnormality identified. Electronically Signed   By: Marcello Moores  Register   On: 02/20/2017 11:41     Scheduled Meds: Continuous Infusions: .  sodium bicarbonate (isotonic) infusion in sterile water 250 mL/hr at 02/20/17 1258     LOS: 1 day    Time spent: 30 min    Janece Canterbury, MD [facility] Pager [phone]  If 7PM-7AM, please contact night-coverage [email or web address] Password [facility] 02/20/2017, 2:35 PM

## 2017-02-20 NOTE — ED Notes (Signed)
Spoke with Government social research officer, mid-level. Mag to be added on to morning labs and will continue to monitor patient

## 2017-02-20 NOTE — Consult Note (Signed)
Referring Provider: No ref. provider found Primary Care Physician:  Carlyle Basques, MD Primary Nephrologist:     Reason for Consultation:  Acute kidney injury   Rhabdomyolysis and drug overdose  HPI:  52 year old man with schizophrenia and HIV was admitted with acute kidney injury and elevated CPK > 50000 Baseline creatinine was 1 and increased to 4.5  Continues on IV fluids 250cc/hr  Has urge to urinate  No foley catheter S He has positive history for cocaine use    Also acute liver injury with AST and ALT elevation Continues on suicidal precautions  Past Medical History:  Diagnosis Date  . Arthritis   . Bipolar 1 disorder ([place])   . Depression   . Hepatitis B   . Human immunodeficiency virus (HIV) ([place])   . Schizophrenia [facility])     Past Surgical History:  Procedure Laterality Date  . TUMOR REMOVAL     From Chest    Prior to Admission medications   Medication Sig Start Date End Date Taking? Authorizing Provider  benztropine (COGENTIN) 1 MG tablet Take 1 tablet (1 mg total) by mouth 2 (two) times daily in the am and at bedtime.. To prevent drug induced involuntary movement 04/19/12  Yes Nwoko, Herbert Pun I, NP  bictegravir-emtricitabine-tenofovir AF (BIKTARVY) 50-200-25 MG TABS tablet Take 1 tablet by mouth daily.   Yes [provider]  traZODone (DESYREL) 100 MG tablet Take 2 tablets (200 mg total) by mouth at bedtime. For insomnia. 04/19/12  Yes Mashburn, Marlane Hatcher, PA-C  albuterol (PROVENTIL HFA;VENTOLIN HFA) 108 (90 BASE) MCG/ACT inhaler Inhale 2 puffs into the lungs every 6 (six) hours as needed for wheezing or shortness of breath. 09/14/11 09/13/12  Readling, Milana Huntsman, MD  citalopram (CELEXA) 40 MG tablet Take 1 tablet (40 mg total) by mouth daily with breakfast. For depression. 04/19/12 04/19/13  Encarnacion Slates, NP    Current Facility-Administered Medications  Medication Dose Route Frequency Provider Last Rate Last Dose  . albuterol (PROVENTIL) (2.5 MG/3ML) 0.083% nebulizer  solution 2.5 mg  2.5 mg Nebulization Q6H PRN Derrill Kay A, MD      . sodium bicarbonate 150 mEq in sterile water 1,000 mL infusion   Intravenous Continuous Kirichenko, Tatyana, PA-C 250 mL/hr at 02/20/17 0855      Allergies as of 02/19/2017 - Review Complete 02/19/2017  Allergen Reaction Noted  . Penicillins Other (See Comments) 08/11/2011  . Latex Rash 12/10/2011  . Tape Rash 08/18/2011    History reviewed. No pertinent family history.  Social History   Socioeconomic History  . Marital status: Married    Spouse name: Not on file  . Number of children: Not on file  . Years of education: Not on file  . Highest education level: Not on file  Social Needs  . Financial resource strain: Not on file  . Food insecurity - worry: Not on file  . Food insecurity - inability: Not on file  . Transportation needs - medical: Not on file  . Transportation needs - non-medical: Not on file  Occupational History  . Not on file  Tobacco Use  . Smoking status: Current Every Day Smoker    Packs/day: 1.00    Years: 15.00    Pack years: 15.00    Types: Cigarettes  . Smokeless tobacco: Current User  Substance and Sexual Activity  . Alcohol use: No    Alcohol/week: 0.0 oz    Comment: past  . Drug use: Yes    Types: Cocaine  Comment: past  . Sexual activity: No  Other Topics Concern  . Not on file  Social History Narrative  . Not on file    Review of Systems: Gen: Denies any fever, chills, sweats, anorexia, fatigue, weakness, malaise, weight loss, and sleep disorder HEENT: No visual complaints, No history of Retinopathy. Normal external appearance No Epistaxis or Sore throat. No sinusitis.   CV: Denies chest pain, angina, palpitations, syncope, orthopnea, PND, peripheral edema, and claudication. Resp: Denies dyspnea at rest, dyspnea with exercise, cough, sputum, wheezing, coughing up blood, and pleurisy. GI: Denies vomiting blood, jaundice, and fecal incontinence.   Denies dysphagia  or odynophagia. GU : Denies urinary burning, blood in urine, urinary frequency, urinary hesitancy, nocturnal urination, and urinary incontinence.  No renal calculi. MS: Denies joint pain, limitation of movement, and swelling, stiffness, low back pain, extremity pain. Denies muscle weakness, cramps, atrophy.  No use of non steroidal antiinflammatory drugs. Derm: Denies rash, itching, dry skin, hives, moles, warts, or unhealing ulcers.  Psych: Denies depression, anxiety, memory loss, suicidal ideation, hallucinations, paranoia, and confusion. Heme: Denies bruising, bleeding, and enlarged lymph nodes. Neuro: No headache.  No diplopia. No dysarthria.  No dysphasia.  No history of CVA.  No Seizures. No paresthesias.  No weakness. Endocrine No DM.  No Thyroid disease.  No Adrenal disease.  Physical Exam: Vital signs in last 24 hours: Temp:  [98.1 F (36.7 C)-98.5 F (36.9 C)] 98.1 F (36.7 C) (02/12 0258) Pulse Rate:  [84-110] 86 (02/12 0258) Resp:  [16-23] 16 (02/12 0258) BP: (97-125)/(59-82) 106/69 (02/12 0258) SpO2:  [96 %-100 %] 96 % (02/12 0258) Weight:  [215 lb (97.5 kg)-222 lb 14.2 oz (101.1 kg)] 222 lb 14.2 oz (101.1 kg) (02/12 0258) Last BM Date: 02/18/17 General:   Alert,  Well-developed, well-nourished, pleasant and cooperative in NAD Head:  Normocephalic and atraumatic. Eyes:  Sclera clear, no icterus.   Conjunctiva pink. Ears:  Normal auditory acuity. Nose:  No deformity, discharge,  or lesions. Mouth:  No deformity or lesions, dentition normal. Neck:  Supple; no masses or thyromegaly. JVP not elevated Lungs:  Clear throughout to auscultation.   No wheezes, crackles, or rhonchi. No acute distress. Heart:  Regular rate and rhythm; no murmurs, clicks, rubs,  or gallops. Abdomen:  Soft, nontender and nondistended. No masses, hepatosplenomegaly or hernias noted. Normal bowel sounds, without guarding, and without rebound.   Msk:  Symmetrical without gross deformities. Normal  posture. Pulses:  No carotid, renal, femoral bruits. DP and PT symmetrical and equal Extremities:  Without clubbing or edema. Neurologic:  Alert and  oriented x4;  grossly normal neurologically. Skin:  Intact without significant lesions or rashes. Cervical Nodes:  No significant cervical adenopathy. Psych:  Alert and cooperative. Normal mood and affect.  Intake/Output from previous day: 02/11 0701 - 02/12 0700 In: 4693.3 [P.O.:960; I.V.:1683.3; IV Piggyback:2000] Out: 32 [Urine:32] Intake/Output this shift: No intake/output data recorded.  Lab Results: Recent Labs    02/19/17 1742 02/20/17 0425  WBC 18.4* 12.6*  HGB 15.6 13.1  HCT 46.8 37.1*  PLT 208 142*   BMET Recent Labs    02/19/17 1742 02/20/17 0425  NA 134* 134*  K 4.9 3.8  CL 96* 95*  CO2 18* 22  GLUCOSE 90 120*  BUN 81* 53*  CREATININE 4.30* 5.29*  CALCIUM 7.8* 6.8*   LFT Recent Labs    02/20/17 0425  PROT 6.1*  ALBUMIN 3.2*  AST 1,449*  ALT 359*  ALKPHOS 68  BILITOT 0.6  PT/INR Recent Labs    02/20/17 0425  LABPROT 13.3  INR 1.02   Hepatitis Panel No results for input(s): HEPBSAG, HCVAB, HEPAIGM, HEPBIGM in the last 72 hours.  Studies/Results: Dg Chest 2 View  Result Date: 02/19/2017 CLINICAL DATA:  52 year old male with a history of renal failure and leukocytosis EXAM: CHEST  2 VIEW COMPARISON:  04/21/2012 FINDINGS: Cardiomediastinal silhouette unchanged in size and contour. No evidence of central vascular congestion. No pneumothorax or pleural effusion. No confluent airspace disease. Coarsened interstitial markings. No displaced fracture IMPRESSION: Chronic lung changes without evidence of superimposed acute cardiopulmonary disease. Electronically Signed   By: Corrie Mckusick D.O.   On: 02/19/2017 21:06   Ct Head Wo Contrast  Result Date: 02/19/2017 CLINICAL DATA:  52 year old male with altered mental status. Suicidal ideation. HIV. EXAM: CT HEAD WITHOUT CONTRAST TECHNIQUE: Contiguous axial  images were obtained from the base of the skull through the vertex without intravenous contrast. COMPARISON:  Report of noncontrast head CT 06/28/2000 (no images available). FINDINGS: Brain: Cerebral volume is within normal limits. No midline shift, ventriculomegaly, mass effect, evidence of mass lesion, intracranial hemorrhage or evidence of cortically based acute infarction. Gray-white matter differentiation is within normal limits throughout the brain. Vascular: No suspicious intracranial vascular hyperdensity. Skull: Chronic left lamina papyracea fracture. No acute osseous abnormality identified. Sinuses/Orbits: Clear, aside from left ethmoid effacement due to the chronic lamina papyracea fracture. Other: Negative visible orbit and scalp soft tissues. IMPRESSION: Negative noncontrast head CT. Electronically Signed   By: Genevie Ann M.D.   On: 02/19/2017 20:59    Assessment/Plan:  Acute kidney injury appears to have a CPK that is elevated tandem with increase in creatinine I suspect that this represents rhabdomyolysis   I think it prudent to transfer to [facility] for acute dialysis needs  Will check urine for u/a and lytes  Continue IV fluids  Recommend Foley catheter   LOS: 1 Ravinder Hofland W @TODAY @10 :23 AM

## 2017-02-20 NOTE — ED Notes (Signed)
ED TO INPATIENT HANDOFF REPORT  Name/Age/Gender Dylan [name] 52 y.o. male  Code Status    Code Status Orders  (From admission, onward)        Start     Ordered   02/19/17 2227  Full code  Continuous     02/19/17 2227    Code Status History    Date Active Date Inactive Code Status Order ID Comments User Context   04/11/2012 19:46 04/12/2012 20:32 Full Code [id number]  Julianne Rice, MD ED   12/10/2011 20:26 12/12/2011 21:49 Full Code [id number]  Teofilo Pod, PA-C ED   08/11/2011 04:49 08/11/2011 15:23 Full Code [id number]  Palumbo-Rasch, April K, MD ED      Home/SNF/Other Home  Chief Complaint SI/HI  Level of Care/Admitting Diagnosis ED Disposition    ED Disposition Condition [place]: [facility] [[id number]]  Level of Care: Telemetry [5]  Admit to tele based on following criteria: Complex arrhythmia (Bradycardia/Tachycardia)  Diagnosis: AKI (acute kidney injury) [facility]) [[id number]]  Admitting Physician: Phillips Grout [[id number]]  Attending Physician: Derrill Kay A [[id number]]  Estimated length of stay: 3 - 4 days  Certification:: I certify this patient will need inpatient services for at least 2 midnights  PT Class (Do Not Modify): Inpatient [101]  PT Acc Code (Do Not Modify): Private [1]       Medical History Past Medical History:  Diagnosis Date  . Arthritis   . Bipolar 1 disorder ([place])   . Depression   . Hepatitis B   . Human immunodeficiency virus (HIV) ([place])   . Schizophrenia ([place])     Allergies Allergies  Allergen Reactions  . Penicillins Other (See Comments)    Childhood allergy  . Latex Rash  . Tape Rash    IV Location/Drains/Wounds Patient Lines/Drains/Airways Status   Active Line/Drains/Airways    Name:   Placement date:   Placement time:   Site:   Days:   Peripheral IV 02/19/17 Right;Anterior Forearm   02/19/17    2031    Forearm   1          Labs/Imaging Results for orders placed or performed during  the hospital encounter of 02/19/17 (from the past 48 hour(s))  CBC with Differential     Status: Abnormal   Collection Time: 02/19/17  5:42 PM  Result Value Ref Range   WBC 18.4 (H) 4.0 - 10.5 K/uL   RBC 5.22 4.22 - 5.81 MIL/uL   Hemoglobin 15.6 13.0 - 17.0 g/dL   HCT 46.8 39.0 - 52.0 %   MCV 89.7 78.0 - 100.0 fL   MCH 29.9 26.0 - 34.0 pg   MCHC 33.3 30.0 - 36.0 g/dL   RDW 13.2 11.5 - 15.5 %   Platelets 208 150 - 400 K/uL   Neutrophils Relative % 80 %   Neutro Abs 14.8 (H) 1.7 - 7.7 K/uL   Lymphocytes Relative 10 %   Lymphs Abs 1.8 0.7 - 4.0 K/uL   Monocytes Relative 10 %   Monocytes Absolute 1.8 (H) 0.1 - 1.0 K/uL   Eosinophils Relative 0 %   Eosinophils Absolute 0.0 0.0 - 0.7 K/uL   Basophils Relative 0 %   Basophils Absolute 0.0 0.0 - 0.1 K/uL    Comment: Performed at [facility], [place] [address] [place], [place] [address]  Comprehensive metabolic panel     Status: Abnormal   Collection Time: 02/19/17  5:42 PM  Result Value Ref  Range   Sodium 134 (L) 135 - 145 mmol/L   Potassium 4.9 3.5 - 5.1 mmol/L   Chloride 96 (L) 101 - 111 mmol/L   CO2 18 (L) 22 - 32 mmol/L   Glucose, Bld 90 65 - 99 mg/dL   BUN 81 (H) 6 - 20 mg/dL   Creatinine, Ser 4.30 (H) 0.61 - 1.24 mg/dL   Calcium 7.8 (L) 8.9 - 10.3 mg/dL   Total Protein 8.4 (H) 6.5 - 8.1 g/dL   Albumin 4.5 3.5 - 5.0 g/dL   AST 2,317 (H) 15 - 41 U/L   ALT 479 (H) 17 - 63 U/L   Alkaline Phosphatase 100 38 - 126 U/L   Total Bilirubin 1.1 0.3 - 1.2 mg/dL   GFR calc non Af Amer 15 (L) >60 mL/min   GFR calc Af Amer 17 (L) >60 mL/min    Comment: (NOTE) The eGFR has been calculated using the CKD EPI equation. This calculation has not been validated in all clinical situations. eGFR's persistently <60 mL/min signify possible Chronic Kidney Disease.    Anion gap 20 (H) 5 - 15    Comment: Performed at [facility], [place] [address], [place], [place] [address]  Urinalysis, Routine w reflex  microscopic     Status: Abnormal   Collection Time: 02/19/17  5:42 PM  Result Value Ref Range   Color, Urine AMBER (A) YELLOW    Comment: BIOCHEMICALS MAY BE AFFECTED BY COLOR   APPearance CLOUDY (A) CLEAR   Specific Gravity, Urine 1.024 1.005 - 1.030   pH 6.0 5.0 - 8.0   Glucose, UA 50 (A) NEGATIVE mg/dL   Hgb urine dipstick MODERATE (A) NEGATIVE   Bilirubin Urine NEGATIVE NEGATIVE   Ketones, ur NEGATIVE NEGATIVE mg/dL   Protein, ur 100 (A) NEGATIVE mg/dL   Nitrite POSITIVE (A) NEGATIVE   Leukocytes, UA NEGATIVE NEGATIVE   RBC / HPF 0-5 0 - 5 RBC/hpf   WBC, UA 0-5 0 - 5 WBC/hpf   Bacteria, UA RARE (A) NONE SEEN   Squamous Epithelial / LPF 0-5 (A) NONE SEEN   Granular Casts, UA PRESENT    Amorphous Crystal PRESENT     Comment: Performed at [facility], [place] [address], [place], [place] [address]  Rapid urine drug screen (hospital performed)     Status: Abnormal   Collection Time: 02/19/17  5:42 PM  Result Value Ref Range   Opiates NONE DETECTED NONE DETECTED   Cocaine POSITIVE (A) NONE DETECTED   Benzodiazepines NONE DETECTED NONE DETECTED   Amphetamines NONE DETECTED NONE DETECTED   Tetrahydrocannabinol NONE DETECTED NONE DETECTED   Barbiturates NONE DETECTED NONE DETECTED    Comment: (NOTE) DRUG SCREEN FOR MEDICAL PURPOSES ONLY.  IF CONFIRMATION IS NEEDED FOR ANY PURPOSE, NOTIFY LAB WITHIN 5 DAYS. LOWEST DETECTABLE LIMITS FOR URINE DRUG SCREEN Drug Class                     Cutoff (ng/mL) Amphetamine and metabolites    1000 Barbiturate and metabolites    200 Benzodiazepine                 169 Tricyclics and metabolites     300 Opiates and metabolites        300 Cocaine and metabolites        300 THC  50 Performed at [facility], [place] [address], [place], [place] [address]   CK     Status: Abnormal   Collection Time: 02/19/17  5:42 PM  Result Value Ref Range   Total CK >50,000 (H) 49 - 397 U/L     Comment: RESULTS CONFIRMED BY MANUAL DILUTION Performed at [facility], [place] [address], [place], [place] [address]   Ethanol     Status: None   Collection Time: 02/19/17  6:30 PM  Result Value Ref Range   Alcohol, Ethyl (B) <10 <10 mg/dL    Comment:        LOWEST DETECTABLE LIMIT FOR SERUM ALCOHOL IS 10 mg/dL FOR MEDICAL PURPOSES ONLY Performed at [place] [address], [place], [name] [address]   Acetaminophen level     Status: Abnormal   Collection Time: 02/19/17  6:30 PM  Result Value Ref Range   Acetaminophen (Tylenol), Serum <10 (L) 10 - 30 ug/mL    Comment:        THERAPEUTIC CONCENTRATIONS VARY SIGNIFICANTLY. A RANGE OF 10-30 ug/mL MAY BE AN EFFECTIVE CONCENTRATION FOR MANY PATIENTS. HOWEVER, SOME ARE BEST TREATED AT CONCENTRATIONS OUTSIDE THIS RANGE. ACETAMINOPHEN CONCENTRATIONS >150 ug/mL AT 4 HOURS AFTER INGESTION AND >50 ug/mL AT 12 HOURS AFTER INGESTION ARE OFTEN ASSOCIATED WITH TOXIC REACTIONS. Performed at [facility], [place] [address], [place], [name] [address]   Salicylate level     Status: None   Collection Time: 02/19/17  6:30 PM  Result Value Ref Range   Salicylate Lvl <7.1 2.8 - 30.0 mg/dL    Comment: Performed at [facility], [place] [address] [place], [name] [address]  Lipase, blood     Status: Abnormal   Collection Time: 02/19/17  6:47 PM  Result Value Ref Range   Lipase 64 (H) 11 - 51 U/L    Comment: Performed at [facility], [place] [address], [place], [place] [address]  Ammonia     Status: Abnormal   Collection Time: 02/19/17  8:25 PM  Result Value Ref Range   Ammonia 43 (H) 9 - 35 umol/L    Comment: Performed at [facility], [place] [address], [place], [place] [address]  CK     Status: Abnormal   Collection Time: 02/19/17 11:23 PM  Result Value Ref Range   Total CK >50,000 (H) 49 - 397 U/L    Comment: RESULTS CONFIRMED BY  MANUAL DILUTION Performed at [facility], [place] [address] [place], [place] [address]    Dg Chest 2 View  Result Date: 02/19/2017 CLINICAL DATA:  52 year old male with a history of renal failure and leukocytosis EXAM: CHEST  2 VIEW COMPARISON:  04/21/2012 FINDINGS: Cardiomediastinal silhouette unchanged in size and contour. No evidence of central vascular congestion. No pneumothorax or pleural effusion. No confluent airspace disease. Coarsened interstitial markings. No displaced fracture IMPRESSION: Chronic lung changes without evidence of superimposed acute cardiopulmonary disease. Electronically Signed   By: Corrie Mckusick D.O.   On: 02/19/2017 21:06   Ct Head Wo Contrast  Result Date: 02/19/2017 CLINICAL DATA:  52 year old male with altered mental status. Suicidal ideation. HIV. EXAM: CT HEAD WITHOUT CONTRAST TECHNIQUE: Contiguous axial images were obtained from the base of the skull through the vertex without intravenous contrast. COMPARISON:  Report of noncontrast head CT 06/28/2000 (no images available). FINDINGS: Brain: Cerebral volume is within normal limits. No midline shift, ventriculomegaly, mass effect, evidence of mass  lesion, intracranial hemorrhage or evidence of cortically based acute infarction. Gray-white matter differentiation is within normal limits throughout the brain. Vascular: No suspicious intracranial vascular hyperdensity. Skull: Chronic left lamina papyracea fracture. No acute osseous abnormality identified. Sinuses/Orbits: Clear, aside from left ethmoid effacement due to the chronic lamina papyracea fracture. Other: Negative visible orbit and scalp soft tissues. IMPRESSION: Negative noncontrast head CT. Electronically Signed   By: Genevie Ann M.D.   On: 02/19/2017 20:59    Pending Labs Unresulted Labs (From admission, onward)   Start     Ordered   02/20/17 0500  Comprehensive metabolic panel  Tomorrow morning,   R     02/19/17 2227   02/20/17 0500  CBC   Tomorrow morning,   R     02/19/17 2227   02/20/17 0500  Protime-INR  Tomorrow morning,   R     02/19/17 2227   02/19/17 2250  CK  Now then every 8 hours,   R     02/19/17 2249      Vitals/Pain Today's Vitals   02/20/17 0000 02/20/17 0100 02/20/17 0131 02/20/17 0200  BP: 100/61 124/82 111/75 (!) 97/59  Pulse: 94 87 100 84  Resp: 19 (!) 23 (!) 21 19  Temp:      TempSrc:      SpO2: 100% 96% 96% 100%  Weight:      Height:        Isolation Precautions No active isolations  Medications Medications  sodium bicarbonate 150 mEq in sterile water 1,000 mL infusion ( Intravenous New Bag/Given 02/19/17 2316)  albuterol (PROVENTIL) (2.5 MG/3ML) 0.083% nebulizer solution 2.5 mg (not administered)  sodium chloride 0.9 % bolus 1,000 mL (0 mLs Intravenous Stopped 02/19/17 2146)  sodium chloride 0.9 % bolus 1,000 mL (0 mLs Intravenous Stopped 02/19/17 2233)    Mobility walks

## 2017-02-20 NOTE — ED Notes (Signed)
Floor coverage paged about EKG change, waiting for call back

## 2017-02-20 NOTE — ED Notes (Signed)
Patient noted to have EKG changes @ 0150. MD paged, waiting for call back

## 2017-02-20 NOTE — Progress Notes (Signed)
Patient unable to void or feel the urge to void during this shift. Patient voided in ED 6ml. This nurse bladder scanned the patient and 24ml was noted. Will continue to monitor patient.

## 2017-02-21 DIAGNOSIS — B20 Human immunodeficiency virus [HIV] disease: Secondary | ICD-10-CM

## 2017-02-21 DIAGNOSIS — Z880 Allergy status to penicillin: Secondary | ICD-10-CM

## 2017-02-21 DIAGNOSIS — F17210 Nicotine dependence, cigarettes, uncomplicated: Secondary | ICD-10-CM

## 2017-02-21 DIAGNOSIS — R45851 Suicidal ideations: Secondary | ICD-10-CM

## 2017-02-21 DIAGNOSIS — F250 Schizoaffective disorder, bipolar type: Secondary | ICD-10-CM

## 2017-02-21 DIAGNOSIS — F1490 Cocaine use, unspecified, uncomplicated: Secondary | ICD-10-CM

## 2017-02-21 DIAGNOSIS — B1910 Unspecified viral hepatitis B without hepatic coma: Secondary | ICD-10-CM

## 2017-02-21 DIAGNOSIS — R740 Nonspecific elevation of levels of transaminase and lactic acid dehydrogenase [LDH]: Secondary | ICD-10-CM

## 2017-02-21 DIAGNOSIS — M6282 Rhabdomyolysis: Secondary | ICD-10-CM

## 2017-02-21 DIAGNOSIS — Z590 Homelessness: Secondary | ICD-10-CM

## 2017-02-21 DIAGNOSIS — G4700 Insomnia, unspecified: Secondary | ICD-10-CM

## 2017-02-21 DIAGNOSIS — F419 Anxiety disorder, unspecified: Secondary | ICD-10-CM

## 2017-02-21 DIAGNOSIS — Z91048 Other nonmedicinal substance allergy status: Secondary | ICD-10-CM

## 2017-02-21 DIAGNOSIS — K7290 Hepatic failure, unspecified without coma: Secondary | ICD-10-CM

## 2017-02-21 DIAGNOSIS — F251 Schizoaffective disorder, depressive type: Secondary | ICD-10-CM

## 2017-02-21 DIAGNOSIS — Z91040 Latex allergy status: Secondary | ICD-10-CM

## 2017-02-21 DIAGNOSIS — R450 Nervousness: Secondary | ICD-10-CM

## 2017-02-21 DIAGNOSIS — T50902D Poisoning by unspecified drugs, medicaments and biological substances, intentional self-harm, subsequent encounter: Secondary | ICD-10-CM

## 2017-02-21 DIAGNOSIS — N179 Acute kidney failure, unspecified: Secondary | ICD-10-CM

## 2017-02-21 DIAGNOSIS — Z7189 Other specified counseling: Secondary | ICD-10-CM

## 2017-02-21 DIAGNOSIS — B181 Chronic viral hepatitis B without delta-agent: Secondary | ICD-10-CM

## 2017-02-21 DIAGNOSIS — T1491XD Suicide attempt, subsequent encounter: Secondary | ICD-10-CM

## 2017-02-21 LAB — RENAL FUNCTION PANEL
ALBUMIN: 2.9 g/dL — AB (ref 3.5–5.0)
Anion gap: 19 — ABNORMAL HIGH (ref 5–15)
BUN: 131 mg/dL — ABNORMAL HIGH (ref 6–20)
CALCIUM: 6.7 mg/dL — AB (ref 8.9–10.3)
CHLORIDE: 85 mmol/L — AB (ref 101–111)
CO2: 28 mmol/L (ref 22–32)
CREATININE: 8.43 mg/dL — AB (ref 0.61–1.24)
GFR, EST AFRICAN AMERICAN: 8 mL/min — AB (ref >60)
GFR, EST NON AFRICAN AMERICAN: 6 mL/min — AB (ref >60)
Glucose, Bld: 124 mg/dL — ABNORMAL HIGH (ref 65–99)
PHOSPHORUS: 6.2 mg/dL — AB (ref 2.5–4.6)
Potassium: 4.2 mmol/L (ref 3.5–5.1)
Sodium: 132 mmol/L — ABNORMAL LOW (ref 135–145)

## 2017-02-21 LAB — CK

## 2017-02-21 LAB — HEPATIC FUNCTION PANEL
ALT: 291 U/L — ABNORMAL HIGH (ref 17–63)
AST: 744 U/L — ABNORMAL HIGH (ref 15–41)
Albumin: 3 g/dL — ABNORMAL LOW (ref 3.5–5.0)
Alkaline Phosphatase: 66 U/L (ref 38–126)
BILIRUBIN INDIRECT: 0.7 mg/dL (ref 0.3–0.9)
Bilirubin, Direct: 0.2 mg/dL (ref 0.1–0.5)
TOTAL PROTEIN: 5.8 g/dL — AB (ref 6.5–8.1)
Total Bilirubin: 0.9 mg/dL (ref 0.3–1.2)

## 2017-02-21 MED ORDER — QUETIAPINE FUMARATE 50 MG PO TABS: 100.0000 mg | ORAL_TABLET | Freq: Every day | ORAL | Status: DC

## 2017-02-21 MED ORDER — OLANZAPINE 5 MG PO TABS: 2.5000 mg | ORAL_TABLET | Freq: Every day | ORAL | Status: DC | PRN

## 2017-02-21 MED ORDER — DEXTROSE 5 % IV SOLN
120.0000 mg | Freq: Once | INTRAVENOUS | Status: AC
  Administered 2017-02-21: 120 mg via INTRAVENOUS
  Filled 2017-02-21: qty 10

## 2017-02-21 MED ORDER — LAMIVUDINE 10 MG/ML PO SOLN
50.0000 mg | Freq: Every day | ORAL | Status: DC
  Administered 2017-02-22 – 2017-03-05 (×12): 50 mg via ORAL
  Filled 2017-02-21 (×15): qty 5

## 2017-02-21 MED ORDER — ZIDOVUDINE 100 MG PO CAPS
300.0000 mg | ORAL_CAPSULE | Freq: Every day | ORAL | Status: DC
  Administered 2017-02-22 – 2017-03-14 (×21): 300 mg via ORAL
  Filled 2017-02-21 (×23): qty 3

## 2017-02-21 MED ORDER — DARUNAVIR-COBICISTAT 800-150 MG PO TABS
1.0000 | ORAL_TABLET | Freq: Every day | ORAL | Status: DC
  Administered 2017-02-22 – 2017-03-05 (×12): 1 via ORAL
  Filled 2017-02-21 (×16): qty 1

## 2017-02-21 MED ORDER — BICTEGRAVIR-EMTRICITAB-TENOFOV 50-200-25 MG PO TABS: 1.0000 | ORAL_TABLET | Freq: Every day | ORAL | Status: DC

## 2017-02-21 MED ORDER — QUETIAPINE FUMARATE 25 MG PO TABS: 25.0000 mg | ORAL_TABLET | Freq: Three times a day (TID) | ORAL | Status: DC | PRN

## 2017-02-21 MED ORDER — FUROSEMIDE 10 MG/ML IJ SOLN
120.0000 mg | Freq: Once | INTRAVENOUS | Status: AC
  Administered 2017-02-21: 120 mg via INTRAVENOUS
  Filled 2017-02-21: qty 12

## 2017-02-21 MED ORDER — OLANZAPINE 5 MG PO TABS
5.0000 mg | ORAL_TABLET | Freq: Every day | ORAL | Status: DC
  Administered 2017-02-21 – 2017-03-05 (×12): 5 mg via ORAL
  Filled 2017-02-21 (×12): qty 1

## 2017-02-21 MED ORDER — DOLUTEGRAVIR SODIUM 50 MG PO TABS: 50.0000 mg | ORAL_TABLET | Freq: Every day | ORAL | Status: DC

## 2017-02-21 NOTE — Consult Note (Signed)
Referring Provider: No ref. provider found Primary Care Physician:  Carlyle Basques, MD Primary Nephrologist:     Reason for Consultation:  Acute kidney injury   Rhabdomyolysis and drug overdose  HPI:  52 year old man with schizophrenia and HIV was admitted with acute kidney injury and elevated CPK > 50000 Baseline creatinine was 1 and increased to  Greater than 7      He has a foley and very minimal urine output   He has positive history for cocaine use    Also acute liver injury with AST and ALT elevation Continues on suicidal precautions  We shall attempt some diuresis today with lasix and see if he has a response I have stopped IV fluids as it appears he has had sufficient volume and would not continue IV bicarbonate for fear of causing a metabolic alkalosis Renal function continues to decline and dialysis may be needed, labs are pending this morning  Past Medical History:  Diagnosis Date  . Arthritis   . Bipolar 1 disorder ([place])   . Depression   . Hepatitis B   . Human immunodeficiency virus (HIV) ([place])   . Schizophrenia [facility])     Past Surgical History:  Procedure Laterality Date  . TUMOR REMOVAL     From Chest    Prior to Admission medications   Medication Sig Start Date End Date Taking? Authorizing Provider  benztropine (COGENTIN) 1 MG tablet Take 1 tablet (1 mg total) by mouth 2 (two) times daily in the am and at bedtime.. To prevent drug induced involuntary movement 04/19/12  Yes Nwoko, Herbert Pun I, NP  bictegravir-emtricitabine-tenofovir AF (BIKTARVY) 50-200-25 MG TABS tablet Take 1 tablet by mouth daily.   Yes [provider]  traZODone (DESYREL) 100 MG tablet Take 2 tablets (200 mg total) by mouth at bedtime. For insomnia. 04/19/12  Yes Mashburn, Marlane Hatcher, PA-C  albuterol (PROVENTIL HFA;VENTOLIN HFA) 108 (90 BASE) MCG/ACT inhaler Inhale 2 puffs into the lungs every 6 (six) hours as needed for wheezing or shortness of breath. 09/14/11 09/13/12  Readling, Milana Huntsman, MD   citalopram (CELEXA) 40 MG tablet Take 1 tablet (40 mg total) by mouth daily with breakfast. For depression. 04/19/12 04/19/13  Encarnacion Slates, NP    Current Facility-Administered Medications  Medication Dose Route Frequency Provider Last Rate Last Dose  . albuterol (PROVENTIL) (2.5 MG/3ML) 0.083% nebulizer solution 2.5 mg  2.5 mg Nebulization Q6H PRN Phillips Grout, MD      . bictegravir-emtricitabine-tenofovir AF (BIKTARVY) 50-200-25 MG per tablet 1 tablet  1 tablet Oral Daily Short, Mackenzie, MD   1 tablet at 02/20/17 1625  . furosemide (LASIX) 120 mg in dextrose 5 % 50 mL IVPB  120 mg Intravenous Once Edrick Oh, MD      . heparin injection 5,000 Units  5,000 Units Subcutaneous Christiana Pellant, MD   5,000 Units at 02/20/17 2208    Allergies as of 02/19/2017 - Review Complete 02/19/2017  Allergen Reaction Noted  . Penicillins Other (See Comments) 08/11/2011  . Latex Rash 12/10/2011  . Tape Rash 08/18/2011    History reviewed. No pertinent family history.  Social History   Socioeconomic History  . Marital status: Married    Spouse name: Not on file  . Number of children: Not on file  . Years of education: Not on file  . Highest education level: Not on file  Social Needs  . Financial resource strain: Not on file  . Food insecurity - worry: Not on  file  . Food insecurity - inability: Not on file  . Transportation needs - medical: Not on file  . Transportation needs - non-medical: Not on file  Occupational History  . Not on file  Tobacco Use  . Smoking status: Current Every Day Smoker    Packs/day: 1.00    Years: 15.00    Pack years: 15.00    Types: Cigarettes  . Smokeless tobacco: Current User  Substance and Sexual Activity  . Alcohol use: No    Alcohol/week: 0.0 oz    Comment: past  . Drug use: Yes    Types: Cocaine    Comment: past  . Sexual activity: No  Other Topics Concern  . Not on file  Social History Narrative  . Not on file      Physical  Exam: Vital signs in last 24 hours: Temp:  [97.9 F (36.6 C)-98.9 F (37.2 C)] 97.9 F (36.6 C) (02/13 0645) Pulse Rate:  [70-84] 70 (02/13 0645) Resp:  [16-18] 16 (02/13 0645) BP: (108-111)/(64-75) 108/71 (02/13 0645) SpO2:  [93 %-95 %] 93 % (02/13 0645) Weight:  [223 lb 12.3 oz (101.5 kg)] 223 lb 12.3 oz (101.5 kg) (02/12 2119) Last BM Date: (P) 02/18/17  Alert pleasant and non distressed this morning No JVP and normal mucus membranes CVS RRR Lungs Clear AS Soft Ext no edema    Intake/Output from previous day: 02/12 0701 - 02/13 0700 In: 4080.8 [P.O.:130; I.V.:3950.8] Out: 200 [Urine:200] Intake/Output this shift: No intake/output data recorded.  Lab Results: Recent Labs    02/19/17 1742 02/20/17 0425  WBC 18.4* 12.6*  HGB 15.6 13.1  HCT 46.8 37.1*  PLT 208 142*   BMET Recent Labs    02/19/17 1742 02/20/17 0425 02/20/17 2047  NA 134* 134* 132*  K 4.9 3.8 3.4*  CL 96* 95* 85*  CO2 18* 22 30  GLUCOSE 90 120* 110*  BUN 81* 53* 117*  CREATININE 4.30* 5.29* 7.06*  CALCIUM 7.8* 6.8* 6.6*  PHOS  --   --  6.1*   LFT Recent Labs    02/20/17 0425 02/20/17 2047  PROT 6.1*  --   ALBUMIN 3.2* 2.8*  AST 1,449*  --   ALT 359*  --   ALKPHOS 68  --   BILITOT 0.6  --    PT/INR Recent Labs    02/20/17 0425  LABPROT 13.3  INR 1.02   Hepatitis Panel No results for input(s): HEPBSAG, HCVAB, HEPAIGM, HEPBIGM in the last 72 hours.  Studies/Results: Dg Chest 2 View  Result Date: 02/19/2017 CLINICAL DATA:  52 year old male with a history of renal failure and leukocytosis EXAM: CHEST  2 VIEW COMPARISON:  04/21/2012 FINDINGS: Cardiomediastinal silhouette unchanged in size and contour. No evidence of central vascular congestion. No pneumothorax or pleural effusion. No confluent airspace disease. Coarsened interstitial markings. No displaced fracture IMPRESSION: Chronic lung changes without evidence of superimposed acute cardiopulmonary disease. Electronically  Signed   By: Corrie Mckusick D.O.   On: 02/19/2017 21:06   Ct Head Wo Contrast  Result Date: 02/19/2017 CLINICAL DATA:  52 year old male with altered mental status. Suicidal ideation. HIV. EXAM: CT HEAD WITHOUT CONTRAST TECHNIQUE: Contiguous axial images were obtained from the base of the skull through the vertex without intravenous contrast. COMPARISON:  Report of noncontrast head CT 06/28/2000 (no images available). FINDINGS: Brain: Cerebral volume is within normal limits. No midline shift, ventriculomegaly, mass effect, evidence of mass lesion, intracranial hemorrhage or evidence of cortically based acute infarction. Gray-white matter  differentiation is within normal limits throughout the brain. Vascular: No suspicious intracranial vascular hyperdensity. Skull: Chronic left lamina papyracea fracture. No acute osseous abnormality identified. Sinuses/Orbits: Clear, aside from left ethmoid effacement due to the chronic lamina papyracea fracture. Other: Negative visible orbit and scalp soft tissues. IMPRESSION: Negative noncontrast head CT. Electronically Signed   By: Genevie Ann M.D.   On: 02/19/2017 20:59   US Renal  Result Date: 02/20/2017 CLINICAL DATA:  Acute renal failure. EXAM: RENAL / URINARY TRACT ULTRASOUND COMPLETE COMPARISON:  No recent prior. FINDINGS: Right Kidney: Length: 12.7 cm. Echogenicity within normal limits. No mass or hydronephrosis visualized. Left Kidney: Length: 12.8 cm. Echogenicity within normal limits. No mass or hydronephrosis visualized. Bladder: Foley catheter in the bladder.  Bladder is decompressed. IMPRESSION: No acute or focal abnormality identified. Electronically Signed   By: Marcello Moores  Register   On: 02/20/2017 11:41    Assessment/Plan:  Acute kidney injury appears to have a CPK that is elevated tandem with increase in creatinine I suspect that this represents rhabdomyolysis  Urine showed dipstick positive blood with no rbc. No hydronephrosis. Minimal urine output although  no signs of fluid overload at this time. Try lasix to promote diuresis today  No dialysis although if renal failure continues to be dense with no urine output, I suspect that he will need dialysis at some point.   STOPPED IVF LASIX 120 mg X 1   LOS: 2 Shermika Balthaser W @TODAY @9 :54 AM

## 2017-02-21 NOTE — Consult Note (Addendum)
[place] Psychiatry Consult   Reason for Consult:  SI/HI Referring Physician:  Dr. Tyrell Antonio Patient Identification: Dylan [name] MRN:  [id number] Principal Diagnosis: Schizoaffective disorder, depressive type [facility]) Diagnosis:   Patient Active Problem List   Diagnosis Date Noted  . Acute kidney injury ([place]) [N17.9] 02/19/2017  . Rhabdomyolysis [M62.82] 02/19/2017  . Suicidal overdose ([place]) [T50.902A] 02/19/2017  . Acute hepatitis [B17.9] 02/19/2017  . Elevated LFTs [R94.5]   . Homicidal ideations [R45.850]   . Schizoaffective disorder, depressive type ([place]) [F25.1] 09/05/2011  . Cocaine abuse ([place]) [F14.10] 09/05/2011  . Degenerative disc disease [IMO0002] 08/21/2011  . HIV disease ([place]) [B20] 08/17/2011    Total Time spent with patient: 1 hour  Subjective:   Dylan [name] is a 52 y.o. male patient admitted with AKI and possible rhabdomyolysis.   HPI:   Per chart review, patient has a history of schizophrenia and reports SI and HI on admission. He reportedly took multiple pills on Friday that he received from a friend. Patient was initially sent from [facility] on 2/11 to be evaluated by psychiatry for overdose and he was incidentally found to have his current medical condition. He is under IVC. He was released from jail 2 weeks ago. He was in prison for 3 years. He has been using cocaine. Home medications include Cogentin 1 mg BID, Celexa 40 mg daily and Trazodone 200 mg qhs.   On interview, Dylan [name] reports that "people were gnawing at his flesh." He was asked to clarify and reports AH that cause him to feel suicidal and homicidal. He denies a specific target. He denies current SI or HI but does report attempting suicide a week ago by ingesting an unknown amount of a friend's medications and using cocaine. He did not tell anyone about his suicide attempt until his follow up at [facility] on 2/11. He reports feeling sick although he did not seek medical attention. He denies  current AH and is able to distract himself by listening to music or watching television. He has not been taking his psychiatric medications. He reports poor sleep and appetite. His mood has not been good but he is unable to provide specific stressors but does report homelessness when asked about his living situation.   Past Psychiatric History: Schizoaffective disorder and cocaine abuse  Risk to Self: Is patient at risk for suicide?: Yes Risk to Others:  None. Denies HI.  Prior Inpatient Therapy:  He was hospitalized 4 years ago for psychosis.  Prior Outpatient Therapy:  He is followed at [facility]. He has received monthly Haldol decanoate 100 mg in the past.     Past Medical History:  Past Medical History:  Diagnosis Date  . Arthritis   . Bipolar 1 disorder ([place])   . Depression   . Hepatitis B   . Human immunodeficiency virus (HIV) ([place])   . Schizophrenia [facility])     Past Surgical History:  Procedure Laterality Date  . TUMOR REMOVAL     From Chest   Family History: History reviewed. No pertinent family history. Family Psychiatric  History: Mother-psychosis  Social History:  Social History   Substance and Sexual Activity  Alcohol Use No  . Alcohol/week: 0.0 oz   Comment: past     Social History   Substance and Sexual Activity  Drug Use Yes  . Types: Cocaine   Comment: past    Social History   Socioeconomic History  . Marital status: Married    Spouse name: None  .  Number of children: None  . Years of education: None  . Highest education level: None  Social Needs  . Financial resource strain: None  . Food insecurity - worry: None  . Food insecurity - inability: None  . Transportation needs - medical: None  . Transportation needs - non-medical: None  Occupational History  . None  Tobacco Use  . Smoking status: Current Every Day Smoker    Packs/day: 1.00    Years: 15.00    Pack years: 15.00    Types: Cigarettes  . Smokeless tobacco: Current User  Substance and  Sexual Activity  . Alcohol use: No    Alcohol/week: 0.0 oz    Comment: past  . Drug use: Yes    Types: Cocaine    Comment: past  . Sexual activity: No  Other Topics Concern  . None  Social History Narrative  . None   Additional Social History: He is homeless x 2 months. He reports recently using cocaine but was abstinent for 4 years. He denies alcohol use.     Allergies:   Allergies  Allergen Reactions  . Penicillins Other (See Comments)    Childhood allergy  . Latex Rash  . Tape Rash    Labs:  Results for orders placed or performed during the hospital encounter of 02/19/17 (from the past 48 hour(s))  CBC with Differential     Status: Abnormal   Collection Time: 02/19/17  5:42 PM  Result Value Ref Range   WBC 18.4 (H) 4.0 - 10.5 K/uL   RBC 5.22 4.22 - 5.81 MIL/uL   Hemoglobin 15.6 13.0 - 17.0 g/dL   HCT 46.8 39.0 - 52.0 %   MCV 89.7 78.0 - 100.0 fL   MCH 29.9 26.0 - 34.0 pg   MCHC 33.3 30.0 - 36.0 g/dL   RDW 13.2 11.5 - 15.5 %   Platelets 208 150 - 400 K/uL   Neutrophils Relative % 80 %   Neutro Abs 14.8 (H) 1.7 - 7.7 K/uL   Lymphocytes Relative 10 %   Lymphs Abs 1.8 0.7 - 4.0 K/uL   Monocytes Relative 10 %   Monocytes Absolute 1.8 (H) 0.1 - 1.0 K/uL   Eosinophils Relative 0 %   Eosinophils Absolute 0.0 0.0 - 0.7 K/uL   Basophils Relative 0 %   Basophils Absolute 0.0 0.0 - 0.1 K/uL    Comment: Performed at [facility], [place] [address] [place], [place] [address]  Comprehensive metabolic panel     Status: Abnormal   Collection Time: 02/19/17  5:42 PM  Result Value Ref Range   Sodium 134 (L) 135 - 145 mmol/L   Potassium 4.9 3.5 - 5.1 mmol/L   Chloride 96 (L) 101 - 111 mmol/L   CO2 18 (L) 22 - 32 mmol/L   Glucose, Bld 90 65 - 99 mg/dL   BUN 81 (H) 6 - 20 mg/dL   Creatinine, Ser 4.30 (H) 0.61 - 1.24 mg/dL   Calcium 7.8 (L) 8.9 - 10.3 mg/dL   Total Protein 8.4 (H) 6.5 - 8.1 g/dL   Albumin 4.5 3.5 - 5.0 g/dL   AST 2,317 (H) 15 - 41 U/L     Comment: RESULTS CONFIRMED BY MANUAL DILUTION   ALT 479 (H) 17 - 63 U/L   Alkaline Phosphatase 100 38 - 126 U/L   Total Bilirubin 1.1 0.3 - 1.2 mg/dL   GFR calc non Af Amer 15 (L) >60 mL/min   GFR calc Af Amer 17 (L) >  60 mL/min    Comment: (NOTE) The eGFR has been calculated using the CKD EPI equation. This calculation has not been validated in all clinical situations. eGFR's persistently <60 mL/min signify possible Chronic Kidney Disease.    Anion gap 20 (H) 5 - 15    Comment: Performed at [facility], [place] [address], [place], [place] [address]  Urinalysis, Routine w reflex microscopic     Status: Abnormal   Collection Time: 02/19/17  5:42 PM  Result Value Ref Range   Color, Urine AMBER (A) YELLOW    Comment: BIOCHEMICALS MAY BE AFFECTED BY COLOR   APPearance CLOUDY (A) CLEAR   Specific Gravity, Urine 1.024 1.005 - 1.030   pH 6.0 5.0 - 8.0   Glucose, UA 50 (A) NEGATIVE mg/dL   Hgb urine dipstick MODERATE (A) NEGATIVE   Bilirubin Urine NEGATIVE NEGATIVE   Ketones, ur NEGATIVE NEGATIVE mg/dL   Protein, ur 100 (A) NEGATIVE mg/dL   Nitrite POSITIVE (A) NEGATIVE   Leukocytes, UA NEGATIVE NEGATIVE   RBC / HPF 0-5 0 - 5 RBC/hpf   WBC, UA 0-5 0 - 5 WBC/hpf   Bacteria, UA RARE (A) NONE SEEN   Squamous Epithelial / LPF 0-5 (A) NONE SEEN   Granular Casts, UA PRESENT    Amorphous Crystal PRESENT     Comment: Performed at [facility], [place] [address], [place], [place] [address]  Rapid urine drug screen (hospital performed)     Status: Abnormal   Collection Time: 02/19/17  5:42 PM  Result Value Ref Range   Opiates NONE DETECTED NONE DETECTED   Cocaine POSITIVE (A) NONE DETECTED   Benzodiazepines NONE DETECTED NONE DETECTED   Amphetamines NONE DETECTED NONE DETECTED   Tetrahydrocannabinol NONE DETECTED NONE DETECTED   Barbiturates NONE DETECTED NONE DETECTED    Comment: (NOTE) DRUG SCREEN FOR MEDICAL PURPOSES ONLY.  IF CONFIRMATION IS  NEEDED FOR ANY PURPOSE, NOTIFY LAB WITHIN 5 DAYS. LOWEST DETECTABLE LIMITS FOR URINE DRUG SCREEN Drug Class                     Cutoff (ng/mL) Amphetamine and metabolites    1000 Barbiturate and metabolites    200 Benzodiazepine                 299 Tricyclics and metabolites     300 Opiates and metabolites        300 Cocaine and metabolites        300 THC                            50 Performed at [facility], [place] [address] [place], [place] [address]   CK     Status: Abnormal   Collection Time: 02/19/17  5:42 PM  Result Value Ref Range   Total CK >50,000 (H) 49 - 397 U/L    Comment: RESULTS CONFIRMED BY MANUAL DILUTION Performed at [facility], [place] [address] [place],  [address]   Ethanol     Status: None   Collection Time: 02/19/17  6:30 PM  Result Value Ref Range   Alcohol, Ethyl (B) <10 <10 mg/dL    Comment:        LOWEST DETECTABLE LIMIT FOR SERUM ALCOHOL IS 10 mg/dL FOR MEDICAL PURPOSES ONLY Performed at [place] [address], [place],  [address]   Acetaminophen level  Status: Abnormal   Collection Time: 02/19/17  6:30 PM  Result Value Ref Range   Acetaminophen (Tylenol), Serum <10 (L) 10 - 30 ug/mL    Comment:        THERAPEUTIC CONCENTRATIONS VARY SIGNIFICANTLY. A RANGE OF 10-30 ug/mL MAY BE AN EFFECTIVE CONCENTRATION FOR MANY PATIENTS. HOWEVER, SOME ARE BEST TREATED AT CONCENTRATIONS OUTSIDE THIS RANGE. ACETAMINOPHEN CONCENTRATIONS >150 ug/mL AT 4 HOURS AFTER INGESTION AND >50 ug/mL AT 12 HOURS AFTER INGESTION ARE OFTEN ASSOCIATED WITH TOXIC REACTIONS. Performed at [facility], [place] [address] [place], [name] [address]   Salicylate level     Status: None   Collection Time: 02/19/17  6:30 PM  Result Value Ref Range   Salicylate Lvl <2.2 2.8 - 30.0 mg/dL    Comment: Performed at [facility], [place] [address], [place], [name]  [address]  Lipase, blood     Status: Abnormal   Collection Time: 02/19/17  6:47 PM  Result Value Ref Range   Lipase 64 (H) 11 - 51 U/L    Comment: Performed at [facility], [place] [address], [place], [place] [address]  Ammonia     Status: Abnormal   Collection Time: 02/19/17  8:25 PM  Result Value Ref Range   Ammonia 43 (H) 9 - 35 umol/L    Comment: Performed at [facility], [place] [address], [place], [place] [address]  CK     Status: Abnormal   Collection Time: 02/19/17 11:23 PM  Result Value Ref Range   Total CK >50,000 (H) 49 - 397 U/L    Comment: RESULTS CONFIRMED BY MANUAL DILUTION Performed at [facility], [place] [address], [place], [place] [address]   Comprehensive metabolic panel     Status: Abnormal   Collection Time: 02/20/17  4:25 AM  Result Value Ref Range   Sodium 134 (L) 135 - 145 mmol/L   Potassium 3.8 3.5 - 5.1 mmol/L    Comment: DELTA CHECK NOTED REPEATED TO VERIFY NO VISIBLE HEMOLYSIS    Chloride 95 (L) 101 - 111 mmol/L   CO2 22 22 - 32 mmol/L   Glucose, Bld 120 (H) 65 - 99 mg/dL   BUN 53 (H) 6 - 20 mg/dL    Comment: RESULTS CONFIRMED BY MANUAL DILUTION   Creatinine, Ser 5.29 (H) 0.61 - 1.24 mg/dL   Calcium 6.8 (L) 8.9 - 10.3 mg/dL   Total Protein 6.1 (L) 6.5 - 8.1 g/dL   Albumin 3.2 (L) 3.5 - 5.0 g/dL   AST 1,449 (H) 15 - 41 U/L   ALT 359 (H) 17 - 63 U/L   Alkaline Phosphatase 68 38 - 126 U/L   Total Bilirubin 0.6 0.3 - 1.2 mg/dL   GFR calc non Af Amer 11 (L) >60 mL/min   GFR calc Af Amer 13 (L) >60 mL/min    Comment: (NOTE) The eGFR has been calculated using the CKD EPI equation. This calculation has not been validated in all clinical situations. eGFR's persistently <60 mL/min signify possible Chronic Kidney Disease.    Anion gap 17 (H) 5 - 15    Comment: Performed at [facility], [place] [address], [place], [place] [address]  CBC     Status: Abnormal   Collection Time: 02/20/17   4:25 AM  Result Value Ref Range   WBC 12.6 (H) 4.0 - 10.5 K/uL   RBC 4.20 (L) 4.22 - 5.81 MIL/uL   Hemoglobin 13.1 13.0 - 17.0  g/dL   HCT 37.1 (L) 39.0 - 52.0 %   MCV 88.3 78.0 - 100.0 fL   MCH 31.2 26.0 - 34.0 pg   MCHC 35.3 30.0 - 36.0 g/dL   RDW 13.1 11.5 - 15.5 %   Platelets 142 (L) 150 - 400 K/uL    Comment: Performed at [facility], [place] [address], [place], [place] [address]  Protime-INR     Status: None   Collection Time: 02/20/17  4:25 AM  Result Value Ref Range   Prothrombin Time 13.3 11.4 - 15.2 seconds   INR 1.02     Comment: Performed at [facility], [place] [address], [place], [place] [address]  CK     Status: Abnormal   Collection Time: 02/20/17  4:25 AM  Result Value Ref Range   Total CK >50,000 (H) 49 - 397 U/L    Comment: RESULTS CONFIRMED BY MANUAL DILUTION Performed at [facility], [place] [address] [place], [place] [address]   Magnesium     Status: Abnormal   Collection Time: 02/20/17  4:25 AM  Result Value Ref Range   Magnesium 2.8 (H) 1.7 - 2.4 mg/dL    Comment: Performed at [facility], [place] [address] [place], [place] [address]  Glucose, capillary     Status: Abnormal   Collection Time: 02/20/17  7:30 AM  Result Value Ref Range   Glucose-Capillary 107 (H) 65 - 99 mg/dL  Urinalysis, Routine w reflex microscopic     Status: Abnormal   Collection Time: 02/20/17 11:00 AM  Result Value Ref Range   Color, Urine AMBER (A) YELLOW    Comment: BIOCHEMICALS MAY BE AFFECTED BY COLOR   APPearance HAZY (A) CLEAR   Specific Gravity, Urine 1.020 1.005 - 1.030   pH 5.0 5.0 - 8.0   Glucose, UA 50 (A) NEGATIVE mg/dL   Hgb urine dipstick LARGE (A) NEGATIVE   Bilirubin Urine NEGATIVE NEGATIVE   Ketones, ur NEGATIVE NEGATIVE mg/dL   Protein, ur 100 (A) NEGATIVE mg/dL   Nitrite NEGATIVE NEGATIVE   Leukocytes, UA NEGATIVE NEGATIVE   RBC / HPF 0-5 0 - 5 RBC/hpf   WBC, UA 6-30 0 - 5 WBC/hpf    Bacteria, UA FEW (A) NONE SEEN   Squamous Epithelial / LPF 0-5 (A) NONE SEEN    Comment: Performed at [facility], [place] [address] [place], [place] [address]  Sodium, urine, random     Status: None   Collection Time: 02/20/17 11:00 AM  Result Value Ref Range   Sodium, Ur 19 mmol/L    Comment: Performed at [place] [address], [place], [name] [address]  Glucose, capillary     Status: None   Collection Time: 02/20/17 12:08 PM  Result Value Ref Range   Glucose-Capillary 80 65 - 99 mg/dL  Renal function panel     Status: Abnormal   Collection Time: 02/20/17  8:47 PM  Result Value Ref Range   Sodium 132 (L) 135 - 145 mmol/L   Potassium 3.4 (L) 3.5 - 5.1 mmol/L   Chloride 85 (L) 101 - 111 mmol/L   CO2 30 22 - 32 mmol/L   Glucose, Bld 110 (H) 65 - 99 mg/dL   BUN 117 (H) 6 - 20 mg/dL   Creatinine, Ser 7.06 (H) 0.61 - 1.24 mg/dL   Calcium 6.6 (L) 8.9 - 10.3 mg/dL   Phosphorus 6.1 (H) 2.5 - 4.6 mg/dL   Albumin  2.8 (L) 3.5 - 5.0 g/dL   GFR calc non Af Amer 8 (L) >60 mL/min   GFR calc Af Amer 9 (L) >60 mL/min    Comment: (NOTE) The eGFR has been calculated using the CKD EPI equation. This calculation has not been validated in all clinical situations. eGFR's persistently <60 mL/min signify possible Chronic Kidney Disease.    Anion gap 17 (H) 5 - 15    Comment: Performed at [facility], [place] [address], [place], [place] [address]  Renal function panel     Status: Abnormal   Collection Time: 02/21/17  9:31 AM  Result Value Ref Range   Sodium 132 (L) 135 - 145 mmol/L   Potassium 4.2 3.5 - 5.1 mmol/L   Chloride 85 (L) 101 - 111 mmol/L   CO2 28 22 - 32 mmol/L   Glucose, Bld 124 (H) 65 - 99 mg/dL   BUN 131 (H) 6 - 20 mg/dL   Creatinine, Ser 8.43 (H) 0.61 - 1.24 mg/dL   Calcium 6.7 (L) 8.9 - 10.3 mg/dL   Phosphorus 6.2 (H) 2.5 - 4.6 mg/dL   Albumin 2.9 (L) 3.5 - 5.0 g/dL   GFR calc non Af Amer 6 (L) >60 mL/min   GFR calc Af Amer 8 (L) >60 mL/min     Comment: (NOTE) The eGFR has been calculated using the CKD EPI equation. This calculation has not been validated in all clinical situations. eGFR's persistently <60 mL/min signify possible Chronic Kidney Disease.    Anion gap 19 (H) 5 - 15    Comment: Performed at [facility], [place] [address] [place], [place] [address]    Current Facility-Administered Medications  Medication Dose Route Frequency Provider Last Rate Last Dose  . albuterol (PROVENTIL) (2.5 MG/3ML) 0.083% nebulizer solution 2.5 mg  2.5 mg Nebulization Q6H PRN Phillips Grout, MD      . bictegravir-emtricitabine-tenofovir AF (BIKTARVY) 50-200-25 MG per tablet 1 tablet  1 tablet Oral Daily Short, Mackenzie, MD   1 tablet at 02/21/17 1034  . heparin injection 5,000 Units  5,000 Units Subcutaneous Christiana Pellant, MD   5,000 Units at 02/20/17 2208    Musculoskeletal: Strength & Muscle Tone: within normal limits Gait & Station: UTA since patient was lying in bed. Patient leans: N/A  Psychiatric Specialty Exam: Physical Exam  Nursing note and vitals reviewed. Constitutional: He is oriented to person, place, and time. He appears well-developed and well-nourished.  HENT:  Head: Normocephalic and atraumatic.  Neck: Normal range of motion.  Respiratory: Effort normal.  Musculoskeletal: Normal range of motion.  Neurological: He is alert and oriented to person, place, and time.  Psychiatric: His speech is normal and behavior is normal. Thought content normal. His affect is blunt. Cognition and memory are normal. He expresses impulsivity.    Review of Systems  Constitutional: Negative for chills and fever.  Cardiovascular: Negative for chest pain.  Gastrointestinal: Positive for constipation. Negative for abdominal pain, diarrhea, nausea and vomiting.  Psychiatric/Behavioral: Positive for depression, hallucinations (AH) and substance abuse. Negative for suicidal ideas. The patient has insomnia. The patient is not  nervous/anxious.   All other systems reviewed and are negative.   Blood pressure 108/71, pulse 70, temperature 97.9 F (36.6 C), resp. rate 16, height 6' 2" (1.88 m), weight 101.5 kg (223 lb 12.3 oz), SpO2 93 %.Body mass index is 28.73 kg/m.  General Appearance: Fairly Groomed, middle aged, obese, African American male, wearing a hospital gown and lying in  bed. NAD.   Eye Contact:  Good  Speech:  Clear and Coherent and Normal Rate  Volume:  Decreased  Mood:  Depressed  Affect:  Constricted  Thought Process:  Goal Directed, Linear and Descriptions of Associations: Intact  Orientation:  Full (Time, Place, and Person)  Thought Content:  Logical and Hallucinations: Auditory  Suicidal Thoughts:  No  Homicidal Thoughts:  No  Memory:  Immediate;   Good Recent;   Good Remote;   Good  Judgement:  Fair  Insight:  Fair  Psychomotor Activity:  Normal  Concentration:  Concentration: Good and Attention Span: Good  Recall:  Good  Fund of Knowledge:  Fair  Language:  Good  Akathisia:  No  Handed:  Right  AIMS (if indicated):   N/A  Assets:  Communication Skills Desire for Improvement  ADL's:  Intact  Cognition:  WNL  Sleep:   Poor   Assessment:  Dylan [name] is a 52 y.o. male who was admitted with AKI and possible rhabdomyolysis. He has a history of schizophrenia and reported SI and HI on admission. He reports intermittent AH that have been causing him to feel suicidal and homicidal. He denies current SI or HI but reports attempting suicide last week by overdosing. He warrants inpatient psychiatric hospitalization for stabilization and treatment.   Treatment Plan Summary: -Patient warrants inpatient psychiatric hospitalization given high risk of harm to self. -Continue Engineer, materials.  -Continue home medications: Start Zyprexa 5 mg qhs and 2.5 mg daily PRN for psychosis.  -Continue to closely monitor for QTc prolongation. QTc 474 on 2/11.  -Patient is under IVC so he cannot leave the  hospital. -Will sign off on patient at this time. Please consult psychiatry again as needed.    Disposition: Recommend psychiatric Inpatient admission when medically cleared.  Faythe Dingwall, DO 02/21/2017 12:09 PM

## 2017-02-21 NOTE — Progress Notes (Signed)
PROGRESS NOTE    Dylan [name]  UUV:[id number] DOB: 06/29/1965 DOA: 02/19/2017 PCP: Carlyle Basques, MD    Brief Narrative: The patient is a 52 year old male with history of HIV, schizophrenia who presented with a 3-week history of suicidal and homicidal ideation.  He took a bunch of pills for friend 2 days prior to admission and had been doing cocaine.  He felt like he wanted to kill himself and other people.  He came to the emergency department because his urine was very dark and he had not been making much urine.  He was found to have rhabdomyolysis with acute renal failure.  He was started on IV fluids but his creatinine continued to rise and he was essentially anuric.  He is being transferred to [facility] for hemodialysis.    Assessment & Plan:   Principal Problem:   Acute kidney injury ([place]) Active Problems:   HIV disease ([place])   Schizoaffective disorder, depressive type ([place])   Cocaine abuse ([place])   Rhabdomyolysis   Suicidal overdose ([place])   Acute hepatitis  1-AKI; Secondary to Rhabdomyolysis.  -CK above 50,000.  -Nephrology consulted. IV fluids discontinue. Patient to received one dose of lasix.  -Nurse will contact Nephrology regarding need for foley  catheter.  -Renal US; negative for hydronephrosis.   Suicidal overdose;  Sitter at bedside.  Continue to have suicidal thought. No IVC paper available, SW consulted to start process.  Psych consulted.   Transaminases; could be related to muscle injury.  Check hepatitis panel.   HIV; discussed with Dr [name] ok to continue with biktarvy regarding patient renal function. He will ask pharmacy to review literature  regarding liver function. I will resume meds, and will repeat LFT in am.   Schizoaffective disorder, depressive type ([place]) -  Holding psychiatric medications pending psychiatry evaluation  Cocaine use.    DVT prophylaxis: heparin Code Status: Full code.  Family Communication: no family at  bedside.  Disposition Plan: remain inpatient. Patient with suicidal thought, renal failure, IVC paper was not completed yesterday. Will start process. Will also consult Psych.    Consultants:   Dr [employer].    Procedures:   none   Antimicrobials:  none   Subjective: He is sleepy, wake up answer few questions, close eyes again.  He still having suicidal thought. He report taking overdose of medication, he doesn't know what medications.   Objective: Vitals:   02/20/17 0258 02/20/17 1512 02/20/17 2119 02/21/17 0645  BP: 106/69 111/75 109/64 108/71  Pulse: 86 78 84 70  Resp: 16 17 18 16   Temp: 98.1 F (36.7 C) 98.2 F (36.8 C) 98.9 F (37.2 C) 97.9 F (36.6 C)  TempSrc: Oral Oral Oral   SpO2: 96% 95% 95% 93%  Weight: 101.1 kg (222 lb 14.2 oz)  101.5 kg (223 lb 12.3 oz)   Height: 6\' 2"  (1.88 m)       Intake/Output Summary (Last 24 hours) at 02/21/2017 1243 Last data filed at 02/21/2017 0612 Gross per 24 hour  Intake 4080.83 ml  Output 200 ml  Net 3880.83 ml   Filed Weights   02/19/17 1722 02/20/17 0258 02/20/17 2119  Weight: 97.5 kg (215 lb) 101.1 kg (222 lb 14.2 oz) 101.5 kg (223 lb 12.3 oz)    Examination:  General exam: NAD, sleepy  Respiratory system: Clear to auscultation. Respiratory effort normal. Cardiovascular system: S1 & S2 heard, RRR. No JVD, murmurs, rubs, gallops or clicks. No pedal edema. Gastrointestinal system: Abdomen is  nondistended, soft and nontender. No organomegaly or masses felt. Normal bowel sounds heard.  Central nervous system: sleepy   Extremities: Symmetric 5 x 5 power. Skin: No rashes, lesions or ulcers    Data Reviewed: I have personally reviewed following labs and imaging studies  CBC: Recent Labs  Lab 02/19/17 1742 02/20/17 0425  WBC 18.4* 12.6*  NEUTROABS 14.8*  --   HGB 15.6 13.1  HCT 46.8 37.1*  MCV 89.7 88.3  PLT 208 008*   Basic Metabolic Panel: Recent Labs  Lab 02/19/17 1742 02/20/17 0425 02/20/17 2047  02/21/17 0931  NA 134* 134* 132* 132*  K 4.9 3.8 3.4* 4.2  CL 96* 95* 85* 85*  CO2 18* 22 30 28   GLUCOSE 90 120* 110* 124*  BUN 81* 53* 117* 131*  CREATININE 4.30* 5.29* 7.06* 8.43*  CALCIUM 7.8* 6.8* 6.6* 6.7*  MG  --  2.8*  --   --   PHOS  --   --  6.1* 6.2*   GFR: Estimated Creatinine Clearance: 13.2 mL/min (A) (by C-G formula based on SCr of 8.43 mg/dL (H)). Liver Function Tests: Recent Labs  Lab 02/19/17 1742 02/20/17 0425 02/20/17 2047 02/21/17 0931  AST 2,317* 1,449*  --   --   ALT 479* 359*  --   --   ALKPHOS 100 68  --   --   BILITOT 1.1 0.6  --   --   PROT 8.4* 6.1*  --   --   ALBUMIN 4.5 3.2* 2.8* 2.9*   Recent Labs  Lab 02/19/17 1847  LIPASE 64*   Recent Labs  Lab 02/19/17 2025  AMMONIA 43*   Coagulation Profile: Recent Labs  Lab 02/20/17 0425  INR 1.02   Cardiac Enzymes: Recent Labs  Lab 02/19/17 1742 02/19/17 2323 02/20/17 0425 02/21/17 0931  CKTOTAL >50,000* >50,000* >50,000* >50,000*   BNP (last 3 results) No results for input(s): PROBNP in the last 8760 hours. HbA1C: No results for input(s): HGBA1C in the last 72 hours. CBG: Recent Labs  Lab 02/20/17 0730 02/20/17 1208  GLUCAP 107* 80   Lipid Profile: No results for input(s): CHOL, HDL, LDLCALC, TRIG, CHOLHDL, LDLDIRECT in the last 72 hours. Thyroid Function Tests: No results for input(s): TSH, T4TOTAL, FREET4, T3FREE, THYROIDAB in the last 72 hours. Anemia Panel: No results for input(s): VITAMINB12, FOLATE, FERRITIN, TIBC, IRON, RETICCTPCT in the last 72 hours. Sepsis Labs: No results for input(s): PROCALCITON, LATICACIDVEN in the last 168 hours.  No results found for this or any previous visit (from the past 240 hour(s)).       Radiology Studies: Dg Chest 2 View  Result Date: 02/19/2017 CLINICAL DATA:  52 year old male with a history of renal failure and leukocytosis EXAM: CHEST  2 VIEW COMPARISON:  04/21/2012 FINDINGS: Cardiomediastinal silhouette unchanged in size  and contour. No evidence of central vascular congestion. No pneumothorax or pleural effusion. No confluent airspace disease. Coarsened interstitial markings. No displaced fracture IMPRESSION: Chronic lung changes without evidence of superimposed acute cardiopulmonary disease. Electronically Signed   By: Corrie Mckusick D.O.   On: 02/19/2017 21:06   Ct Head Wo Contrast  Result Date: 02/19/2017 CLINICAL DATA:  51 year old male with altered mental status. Suicidal ideation. HIV. EXAM: CT HEAD WITHOUT CONTRAST TECHNIQUE: Contiguous axial images were obtained from the base of the skull through the vertex without intravenous contrast. COMPARISON:  Report of noncontrast head CT 06/28/2000 (no images available). FINDINGS: Brain: Cerebral volume is within normal limits. No midline shift, ventriculomegaly, mass effect,  evidence of mass lesion, intracranial hemorrhage or evidence of cortically based acute infarction. Gray-white matter differentiation is within normal limits throughout the brain. Vascular: No suspicious intracranial vascular hyperdensity. Skull: Chronic left lamina papyracea fracture. No acute osseous abnormality identified. Sinuses/Orbits: Clear, aside from left ethmoid effacement due to the chronic lamina papyracea fracture. Other: Negative visible orbit and scalp soft tissues. IMPRESSION: Negative noncontrast head CT. Electronically Signed   By: Genevie Ann M.D.   On: 02/19/2017 20:59   US Renal  Result Date: 02/20/2017 CLINICAL DATA:  Acute renal failure. EXAM: RENAL / URINARY TRACT ULTRASOUND COMPLETE COMPARISON:  No recent prior. FINDINGS: Right Kidney: Length: 12.7 cm. Echogenicity within normal limits. No mass or hydronephrosis visualized. Left Kidney: Length: 12.8 cm. Echogenicity within normal limits. No mass or hydronephrosis visualized. Bladder: Foley catheter in the bladder.  Bladder is decompressed. IMPRESSION: No acute or focal abnormality identified. Electronically Signed   By: Marcello Moores   Register   On: 02/20/2017 11:41        Scheduled Meds: . heparin injection (subcutaneous)  5,000 Units Subcutaneous Q8H   Continuous Infusions:   LOS: 2 days    Time spent: 35 minutes    Elmarie Shiley, MD [facility] Pager [phone]  If 7PM-7AM, please contact night-coverage [email or web address] Password [facility] 02/21/2017, 12:43 PM

## 2017-02-21 NOTE — Consult Note (Signed)
Date of Admission:  02/19/2017          Reason for Consult: concern about safety of ARV in setting of Rhabdomyolysis and Acute renal failure     Referring Provider: Dr. Tyrell Antonio   Assessment: 1. Rhabdomyolysis: likely due to COCAINE use +/- prescription drug overdose and being immobile for 1 day with  2. Acute Renal Failure and imminent need for HD 3. Elevated transaminases likely secondary to muscle damage rather than liver damage 4. Concern regarding risk that integrase strand transfer inhibitor (INSTI) could be involved in causing rhabdomyolysis 5. Concern re the safety of Tenofovir Alafenamide in setting of ARF 6. Drug overdose 7. Heavy cocaine use 8, Suicide attempt 9 Suicidal ideation and homicidal ideation 10. Recent incarceration 11. Homelessness   Plan: 1. DC BIKTARVY 2. Change to Prezcobix and renally dosed AZT and 3TC 3. ONce he stabilized we will need to procure him an HIV regimen using pharmaceutical assistance while applying for "Emergency ADAP" program 4. Consult to Case management 5. Needs close attention from psychiatry 6. We could work with [facility] re housing but that would be in the outpatient arena 7. Check Ultra quant RNA to assess adherence to ARV along with CD4  Principal Problem:   Schizoaffective disorder, depressive type (HCC) Active Problems:   HIV disease (HCC)   Cocaine abuse ([place])   Acute kidney injury ([place])   Rhabdomyolysis   Suicidal overdose ([place])   Acute hepatitis   Schizoaffective disorder, bipolar type ([place])   Scheduled Meds: . [START ON 02/22/2017] darunavir-cobicistat  1 tablet Oral Q breakfast  . heparin injection (subcutaneous)  5,000 Units Subcutaneous Q8H  . [START ON 02/22/2017] lamiVUDine  50 mg Oral Daily  . OLANZapine  5 mg Oral QHS  . [START ON 02/22/2017] zidovudine  300 mg Oral Daily   Continuous Infusions: PRN Meds:.albuterol, OLANZapine  HPI: Dylan [name] is a 52 y.o. male with HIV and hepatitis B co-infection,  comorbid schizo-affective disorder whom Dr. Baxter Flattery began caring for in 2013 through 2014. He has extensive NRTI and NNRTI resistance but had maintained previous healthy virological suppression on boosted Atazanavir and Truvada.   He has not been in care with [place] since 2014 having been incarcerated. He was apparently changed to [facility] while in prison which is a regimen that like his prior boosted PI regimens technically only has 2 FULLY active agents but likely was keeping his virus under control--though I do not have ready access to his prison records.  Regardless he apparently had been recently released from prison and has been homeless. Per the admitting physicians notes the patient had been having a history of 3 weeks of suicidal and homicidal ideation. He was in a hotel where he had been doing quite a large amount of cocaine. He then took a "bunch:" or handful of pills from his friend. My understanding is that this was with intention of killing himself but this is now less clear to me.   He claimed to be followed in our clinic but has not been seen since 2014. I asked him if he was given sufficient ARV for him to still be on his [facility] and he said he was and that he only missed "a day."  He also told me that he was released from prison 4 days ago which does not seem likely to be accurate.  In any case after taking pills from his friend he apparently was immobile for a day and slept on a mattress  and rested there. He then noticed his urine turn dark and he was worried and his friend brought him to the ED at [facility].  In the ED he had evidence of frankl rhabdomyolysis with CPK >50K and ARF, and elevation of transaminases.   I was initially called over the phone about the patient and told that he was a highly adherent patient who had overdosed on illicit drugs and that this had caused rhabdomyolysis. The question was whether it was safe to continue his chronic ARV which was BIKTARVY.  When hearing  about the case over the phone I felt that it was exceedingly unlikely that the patients Tenofovir Alafenamide would be at risk of causing further nephrotoxicity. This drug is a much more efficient pro-drug of TNF and unlike TDF which can be a disaster in this type of situation, TAF concentrates 90% intracellular with only 10% of 25 mg in his drug being in plasma where it would be exposed to distal tubules. In clinical trials there has been ZERO nephrotoxicity attributed to TAF. There have been a handful of case reports but they have upon my review been marked by confounders that were more likely to be to blame.  I initially said it should be find to continue the [facility] and the patient has received 2 doses here so far.   Today I was called again due to concerns about giving BIKTARVY in the context of  Renal failure and hepatic failure.  I had extensive conversations with Onnie Boer, Pharm D one of most experienced ID and HIV pharmacists at [facility] and subsequently with Edrick Oh, MD.  I personally believe that this patient's rhabdo was highly likely caused by large cocaine consumptions +/- drugs he took from friend + immobility.   That being said there have been case reports of rhabdomyolysis with RALTEGRAVIR (ISENTRESS) and I believe DOLUTEGRAVIR (TIVICAY) though I could not readily find reference to the latter. BICTEGRAVIR is very similar structurally to DOLUTEGRAVIR rather than RALTEGRAVIR.   In interests of minimizing any drugs that could be contributing to his clinical presentation I think that we should take him off the INSTI.  With re to TAF I am skeptical that this drug will cause renal toxicity.  However with discussions with Dr. Justin Mend it is becoming apparent that this patient's chances of renal recovery are very small.  In this situation I would prefer to err on the side of being cautious and simply avoiding the TAF despite the fact that it has been found to be "kidney friendly  We will  check ultra HIV quant RNA to see if he truly has been on meds as he describes which will be important to his HIV care and also informing our understanding what has happened here.  Therefore I am switching him PREZCOBIX , and renally dosed AZT and 3tC.    Review of Systems: Review of Systems  Constitutional: Positive for diaphoresis and malaise/fatigue. Negative for chills, fever and weight loss.  HENT: Negative for congestion, hearing loss, sore throat and tinnitus.   Eyes: Negative for blurred vision and double vision.  Respiratory: Negative for cough, sputum production, shortness of breath and wheezing.   Cardiovascular: Negative for chest pain, palpitations and leg swelling.  Gastrointestinal: Positive for nausea. Negative for abdominal pain, blood in stool, constipation, diarrhea, heartburn, melena and vomiting.  Genitourinary: Positive for hematuria. Negative for dysuria and flank pain.  Musculoskeletal: Positive for joint pain and myalgias. Negative for back pain and falls.  Skin: Negative for  itching and rash.  Neurological: Positive for weakness. Negative for dizziness, sensory change, focal weakness, loss of consciousness and headaches.  Endo/Heme/Allergies: Does not bruise/bleed easily.  Psychiatric/Behavioral: Positive for depression, substance abuse and suicidal ideas. Negative for memory loss. The patient is nervous/anxious and has insomnia.     Past Medical History:  Diagnosis Date  . Arthritis   . Bipolar 1 disorder ([place])   . Depression   . Hepatitis B   . Human immunodeficiency virus (HIV) ([place])   . Schizophrenia ([place])     Social History   Tobacco Use  . Smoking status: Current Every Day Smoker    Packs/day: 1.00    Years: 15.00    Pack years: 15.00    Types: Cigarettes  . Smokeless tobacco: Current User  Substance Use Topics  . Alcohol use: No    Alcohol/week: 0.0 oz    Comment: past  . Drug use: Yes    Types: Cocaine    Comment: past    History  reviewed. No pertinent family history. Allergies  Allergen Reactions  . Penicillins Other (See Comments)    Childhood allergy  . Latex Rash  . Tape Rash    OBJECTIVE: Blood pressure 127/70, pulse 73, temperature 98.8 F (37.1 C), temperature source Oral, resp. rate 18, height 6\' 2"  (1.88 m), weight 223 lb 12.3 oz (101.5 kg), SpO2 95 %.  Physical Exam  Constitutional: He is oriented to person, place, and time. No distress.  HENT:  Head: Normocephalic and atraumatic.  Right Ear: External ear normal.  Left Ear: External ear normal.  Nose: Nose normal.  Mouth/Throat: Oropharynx is clear and moist. No oropharyngeal exudate.  Eyes: Conjunctivae and EOM are normal. Pupils are equal, round, and reactive to light. No scleral icterus.  Neck: Normal range of motion. Neck supple.  Cardiovascular: Normal rate, regular rhythm and normal heart sounds. Exam reveals no gallop and no friction rub.  No murmur heard. Pulmonary/Chest: Effort normal and breath sounds normal. No respiratory distress. He has no wheezes. He has no rales.  Abdominal: Soft. Bowel sounds are normal. He exhibits no distension. There is no tenderness. There is no rebound.  Musculoskeletal: Normal range of motion. He exhibits no edema or tenderness.  Lymphadenopathy:    He has no cervical adenopathy.  Neurological: He is alert and oriented to person, place, and time. Gait normal. Coordination normal.  Skin: Skin is warm and dry. No rash noted. He is not diaphoretic. No erythema. No pallor.  Psychiatric: Memory and judgment normal. He exhibits a depressed mood. He has a flat affect.    Lab Results Lab Results  Component Value Date   WBC 12.6 (H) 02/20/2017   HGB 13.1 02/20/2017   HCT 37.1 (L) 02/20/2017   MCV 88.3 02/20/2017   PLT 142 (L) 02/20/2017    Lab Results  Component Value Date   CREATININE 8.43 (H) 02/21/2017   BUN 131 (H) 02/21/2017   NA 132 (L) 02/21/2017   K 4.2 02/21/2017   CL 85 (L) 02/21/2017   CO2  28 02/21/2017    Lab Results  Component Value Date   ALT 291 (H) 02/21/2017   AST 744 (H) 02/21/2017   ALKPHOS 66 02/21/2017   BILITOT 0.9 02/21/2017     Microbiology: No results found for this or any previous visit (from the past 240 hour(s)).  Alcide Evener, [place] [facility] [phone] pager  02/21/2017, 7:58 PM

## 2017-02-21 NOTE — Clinical Social Work Note (Addendum)
CSW received consult to IV-C patient. Appropriate paperwork completed, signed by MD, notarized and faxed to [employer]. The [facility] has been hospital to serve patient and paperwork in chart. Reviewed chart this evening and psychiatry is recommending inpatient psych. CSW will initiate search when patient medically stable for discharge.  Renelle Stegenga Givens, MSW, LCSW Licensed Clinical Social Worker [place] [phone]

## 2017-02-22 ENCOUNTER — Encounter (HOSPITAL_COMMUNITY): Payer: Self-pay | Admitting: Interventional Radiology

## 2017-02-22 ENCOUNTER — Inpatient Hospital Stay (HOSPITAL_COMMUNITY): Payer: Self-pay

## 2017-02-22 DIAGNOSIS — R45850 Homicidal ideations: Secondary | ICD-10-CM

## 2017-02-22 DIAGNOSIS — R45851 Suicidal ideations: Secondary | ICD-10-CM

## 2017-02-22 DIAGNOSIS — B169 Acute hepatitis B without delta-agent and without hepatic coma: Secondary | ICD-10-CM

## 2017-02-22 HISTORY — PX: IR FLUORO GUIDE CV LINE RIGHT: IMG2283

## 2017-02-22 HISTORY — PX: IR US GUIDE VASC ACCESS RIGHT: IMG2390

## 2017-02-22 LAB — COMPREHENSIVE METABOLIC PANEL
ALBUMIN: 2.7 g/dL — AB (ref 3.5–5.0)
ALT: 236 U/L — ABNORMAL HIGH (ref 17–63)
ANION GAP: 21 — AB (ref 5–15)
AST: 451 U/L — AB (ref 15–41)
Alkaline Phosphatase: 60 U/L (ref 38–126)
BUN: 150 mg/dL — AB (ref 6–20)
CO2: 26 mmol/L (ref 22–32)
Calcium: 6.9 mg/dL — ABNORMAL LOW (ref 8.9–10.3)
Chloride: 85 mmol/L — ABNORMAL LOW (ref 101–111)
Creatinine, Ser: 10.9 mg/dL — ABNORMAL HIGH (ref 0.61–1.24)
GFR calc Af Amer: 6 mL/min — ABNORMAL LOW (ref >60)
GFR calc non Af Amer: 5 mL/min — ABNORMAL LOW (ref >60)
GLUCOSE: 96 mg/dL (ref 65–99)
POTASSIUM: 3.9 mmol/L (ref 3.5–5.1)
SODIUM: 132 mmol/L — AB (ref 135–145)
Total Bilirubin: 0.7 mg/dL (ref 0.3–1.2)
Total Protein: 5.6 g/dL — ABNORMAL LOW (ref 6.5–8.1)

## 2017-02-22 LAB — HIV-1 RNA QUANT-NO REFLEX-BLD: LOG10 HIV-1 RNA: UNDETERMINED log10copy/mL

## 2017-02-22 LAB — CK: Total CK: >50000 U/L — ABNORMAL HIGH (ref 49–397)

## 2017-02-22 LAB — CD4/CD8 (T-HELPER/T-SUPPRESSOR CELL)
CD4 absolute: 320 /uL — ABNORMAL LOW (ref 500–1900)
CD4%: 24 % — ABNORMAL LOW (ref 30.0–60.0)
CD8 T CELL ABS: 420 /uL (ref 230–1000)
CD8tox: 32 % (ref 15.0–40.0)
RATIO: 0.76 — AB (ref 1.0–3.0)
TOTAL LYMPHOCYTE COUNT: 1310 /uL (ref 1000–4000)

## 2017-02-22 LAB — BILIRUBIN, DIRECT: Bilirubin, Direct: 0.1 mg/dL (ref 0.1–0.5)

## 2017-02-22 MED ORDER — HEPARIN SODIUM (PORCINE) 1000 UNIT/ML IJ SOLN
INTRAMUSCULAR | Status: AC
  Filled 2017-02-22: qty 1

## 2017-02-22 MED ORDER — LIDOCAINE HCL 1 % IJ SOLN
INTRAMUSCULAR | Status: DC | PRN
  Administered 2017-02-22: 10 mL

## 2017-02-22 MED ORDER — SORBITOL 70 % SOLN
30.0000 mL | Freq: Once | Status: AC
  Administered 2017-02-22: 30 mL via ORAL
  Filled 2017-02-22: qty 30

## 2017-02-22 MED ORDER — HEPARIN SODIUM (PORCINE) 5000 UNIT/ML IJ SOLN
5000.0000 [IU] | Freq: Three times a day (TID) | INTRAMUSCULAR | Status: DC
  Administered 2017-02-23 – 2017-03-03 (×7): 5000 [IU] via SUBCUTANEOUS
  Filled 2017-02-22 (×19): qty 1

## 2017-02-22 MED ORDER — LIDOCAINE HCL 1 % IJ SOLN
INTRAMUSCULAR | Status: AC
  Filled 2017-02-22: qty 20

## 2017-02-22 MED ORDER — FUROSEMIDE 10 MG/ML IJ SOLN
120.0000 mg | Freq: Two times a day (BID) | INTRAMUSCULAR | Status: DC
  Administered 2017-02-22 – 2017-02-23 (×2): 120 mg via INTRAVENOUS
  Filled 2017-02-22: qty 12
  Filled 2017-02-22 (×2): qty 10

## 2017-02-22 NOTE — Procedures (Signed)
Interventional Radiology Procedure Note  Procedure: Image guided right IJ HD temp HD catheter.  Complications: None Recommendations:  - Ok to use - Do not submerge   - Routine catheter care   Signed,  Dulcy Fanny. Earleen Newport, DO

## 2017-02-22 NOTE — Progress Notes (Signed)
[facility] ROUNDING NOTE   Subjective:     52 year old man with schizophrenia and HIV was admitted with acute kidney injury and elevated CPK > 50000 Baseline creatinine was 1 and increased to  Greater than 7      He has a foley and very minimal urine output   He has positive history for cocaine use    Also acute liver injury with AST and ALT elevation Continues on suicidal precautions   I have stopped IV fluids as it appears he has had sufficient volume and would not continue IV bicarbonate for fear of causing a metabolic alkalosis Renal function continues to decline and dialysis may be needed, I have asked for placement of catheter We shall see how labs look tomorrow I will continue lasix 120mg  IV q 12 h     Objective:  Vital signs in last 24 hours:  Temp:  [98.5 F (36.9 C)-98.9 F (37.2 C)] 98.5 F (36.9 C) (02/14 0947) Pulse Rate:  [67-73] 67 (02/14 0947) Resp:  [18] 18 (02/14 0947) BP: (127-140)/(69-76) 129/76 (02/14 0947) SpO2:  [94 %-96 %] 96 % (02/14 0947)  Weight change:  Filed Weights   02/19/17 1722 02/20/17 0258 02/20/17 2119  Weight: 215 lb (97.5 kg) 222 lb 14.2 oz (101.1 kg) 223 lb 12.3 oz (101.5 kg)    Intake/Output: I/O last 3 completed shifts: In: 2272.5 [P.O.:730; I.V.:1342.5; IV Piggyback:200] Out: 125 [Urine:125]   Intake/Output this shift:  Total I/O In: 120 [P.O.:120] Out: -   CVS- RRR RS- CTA ABD- BS present soft non-distended EXT- no edema   Basic Metabolic Panel: Recent Labs  Lab 02/19/17 1742 02/20/17 0425 02/20/17 2047 02/21/17 0931 02/22/17 0536  NA 134* 134* 132* 132* 132*  K 4.9 3.8 3.4* 4.2 3.9  CL 96* 95* 85* 85* 85*  CO2 18* 22 30 28 26   GLUCOSE 90 120* 110* 124* 96  BUN 81* 53* 117* 131* 150*  CREATININE 4.30* 5.29* 7.06* 8.43* 10.90*  CALCIUM 7.8* 6.8* 6.6* 6.7* 6.9*  MG  --  2.8*  --   --   --   PHOS  --   --  6.1* 6.2*  --     Liver Function Tests: Recent Labs  Lab 02/19/17 1742  02/20/17 0425 02/20/17 2047 02/21/17 0931 02/22/17 0536  AST 2,317* 1,449*  --  744* 451*  ALT 479* 359*  --  291* 236*  ALKPHOS 100 68  --  66 60  BILITOT 1.1 0.6  --  0.9 0.7  PROT 8.4* 6.1*  --  5.8* 5.6*  ALBUMIN 4.5 3.2* 2.8* 3.0*  2.9* 2.7*   Recent Labs  Lab 02/19/17 1847  LIPASE 64*   Recent Labs  Lab 02/19/17 2025  AMMONIA 43*    CBC: Recent Labs  Lab 02/19/17 1742 02/20/17 0425  WBC 18.4* 12.6*  NEUTROABS 14.8*  --   HGB 15.6 13.1  HCT 46.8 37.1*  MCV 89.7 88.3  PLT 208 142*    Cardiac Enzymes: Recent Labs  Lab 02/19/17 1742 02/19/17 2323 02/20/17 0425 02/21/17 0931 02/22/17 0536  CKTOTAL >50,000* >50,000* >50,000* >50,000* >50,000*    BNP: Invalid input(s): POCBNP  CBG: Recent Labs  Lab 02/20/17 0730 02/20/17 1208  GLUCAP 107* 80    Microbiology: No results found for this or any previous visit.  Coagulation Studies: Recent Labs    02/20/17 0425  LABPROT 13.3  INR 1.02    Urinalysis: Recent Labs    02/19/17 1742 02/20/17  [place] 1.024 1.020  PHURINE 6.0 5.0  GLUCOSEU 50* 50*  HGBUR MODERATE* LARGE*  BILIRUBINUR NEGATIVE NEGATIVE  KETONESUR NEGATIVE NEGATIVE  PROTEINUR 100* 100*  NITRITE POSITIVE* NEGATIVE  LEUKOCYTESUR NEGATIVE NEGATIVE      Imaging: No results found.   Medications:   . furosemide     . darunavir-cobicistat  1 tablet Oral Q breakfast  . [START ON 02/23/2017] heparin injection (subcutaneous)  5,000 Units Subcutaneous Q8H  . lamiVUDine  50 mg Oral Daily  . OLANZapine  5 mg Oral QHS  . sorbitol  30 mL Oral Once  . zidovudine  300 mg Oral Daily   albuterol, OLANZapine  Assessment/ Plan:   Acute kidney injury appears to have a CPK that is elevated tandem with increase in creatinine I suspect that this represents rhabdomyolysis  Urine showed dipstick positive blood with no rbc. No hydronephrosis. Minimal urine output although no signs of fluid overload at this  time.   Continue diuresis today with IV lasix   He now has a dialysis catheter and I shall wait until tomorrow  There are no emergent indications for dialysis  ID  Appreciate Dr [name] input  That has been very helpful     LOS: 3 Scout Gumbs W @TODAY @1 :33 PM

## 2017-02-22 NOTE — Care Management Note (Addendum)
Case Management Note  Patient Details  Name: Dylan [name] MRN: [id number] Date of Birth: 01/10/1965  Subjective/Objective:          history of HIV, schizophrenia; Admitted for rhabdomyolysis with acute renal failure.     Action/Plan: Transferred to [facility] for hemodialysis. Infectious disease following. IV-C patient. Appropriate paperwork completed, signed by MD, notarized and faxed to [employer].  PCP is Dr. Harlan Stains.  Expected Discharge Date:                  [facility]  In-House Referral:  Clinical Social Work  Discharge planning Services  CM Consult  Status of Service:  In process, will continue to follow   Additional Comments: LSCW "Lorriane Shire" following for discharge placement.  Royetta Crochet. Mady Gemma, BSN, [place] [phone]  02/22/2017, 10:54 AM

## 2017-02-22 NOTE — Progress Notes (Signed)
Subjective:  He continues to complain of pain in his legs.   Antibiotics:  Anti-infectives (From admission, onward)   Start     Dose/Rate Route Frequency Ordered Stop   02/22/17 1000  bictegravir-emtricitabine-tenofovir AF (BIKTARVY) 50-200-25 MG per tablet 1 tablet  Status:  Discontinued     1 tablet Oral Daily 02/21/17 1418 02/21/17 1618   02/22/17 1000  zidovudine (RETROVIR) capsule 300 mg     300 mg Oral Daily 02/21/17 1951     02/22/17 1000  lamiVUDine (EPIVIR) 10 MG/ML solution 50 mg     50 mg Oral Daily 02/21/17 1951     02/22/17 0800  dolutegravir (TIVICAY) tablet 50 mg  Status:  Discontinued     50 mg Oral Daily with breakfast 02/21/17 1619 02/21/17 1948   02/22/17 0800  darunavir-cobicistat (PREZCOBIX) 800-150 MG per tablet 1 tablet     1 tablet Oral Daily with breakfast 02/21/17 1619     02/20/17 1700  bictegravir-emtricitabine-tenofovir AF (BIKTARVY) 50-200-25 MG per tablet 1 tablet  Status:  Discontinued     1 tablet Oral Daily 02/20/17 1503 02/21/17 1242      Medications: Scheduled Meds: . heparin      . lidocaine      . darunavir-cobicistat  1 tablet Oral Q breakfast  . [START ON 02/23/2017] heparin injection (subcutaneous)  5,000 Units Subcutaneous Q8H  . lamiVUDine  50 mg Oral Daily  . OLANZapine  5 mg Oral QHS  . sorbitol  30 mL Oral Once  . zidovudine  300 mg Oral Daily   Continuous Infusions: . furosemide     PRN Meds:.albuterol, lidocaine, OLANZapine    Objective: Weight change:   Intake/Output Summary (Last 24 hours) at 02/22/2017 1545 Last data filed at 02/22/2017 0900 Gross per 24 hour  Intake 920 ml  Output 75 ml  Net 845 ml   Blood pressure 129/76, pulse 67, temperature 98.5 F (36.9 C), temperature source Oral, resp. rate 18, height 6\' 2"  (1.88 m), weight 223 lb 12.3 oz (101.5 kg), SpO2 96 %. Temp:  [98.5 F (36.9 C)-98.9 F (37.2 C)] 98.5 F (36.9 C) (02/14 0947) Pulse Rate:  [67-73] 67 (02/14 0947) Resp:  [18] 18 (02/14  0947) BP: (127-140)/(69-76) 129/76 (02/14 0947) SpO2:  [94 %-96 %] 96 % (02/14 0947)  Physical Exam: General: Alert and awake, oriented x3, not in any acute distress, dysphoric HEENT: anicteric sclera, , EOMI CVS regular rate, normal r,  no murmur rubs or gallops Chest: clear to auscultation bilaterally, no wheezing, rales or rhonchi Abdomen: soft nontender, nondistended, normal bowel sounds, Extremities:  Skin: no rashes Neuro: nonfocal  CBC: CBC Latest Ref Rng & Units 02/20/2017 02/19/2017 04/11/2012  WBC 4.0 - 10.5 K/uL 12.6(H) 18.4(H) 9.2  Hemoglobin 13.0 - 17.0 g/dL 13.1 15.6 15.0  Hematocrit 39.0 - 52.0 % 37.1(L) 46.8 41.9  Platelets 150 - 400 K/uL 142(L) 208 169     BMET Recent Labs    02/21/17 0931 02/22/17 0536  NA 132* 132*  K 4.2 3.9  CL 85* 85*  CO2 28 26  GLUCOSE 124* 96  BUN 131* 150*  CREATININE 8.43* 10.90*  CALCIUM 6.7* 6.9*     Liver Panel  Recent Labs    02/21/17 0931 02/22/17 0536  PROT 5.8* 5.6*  ALBUMIN 3.0*  2.9* 2.7*  AST 744* 451*  ALT 291* 236*  ALKPHOS 66 60  BILITOT 0.9 0.7  BILIDIR 0.2 0.1  IBILI 0.7  --  Sedimentation Rate No results for input(s): ESRSEDRATE in the last 72 hours. C-Reactive Protein No results for input(s): CRP in the last 72 hours.  Micro Results: No results found for this or any previous visit (from the past 720 hour(s)).  Studies/Results: No results found.    Assessment/Plan:  INTERVAL HISTORY: pt renal fxn declines further CPK still elevated   Principal Problem:   Schizoaffective disorder, depressive type (HCC) Active Problems:   HIV disease (HCC)   Cocaine abuse ([place])   Acute kidney injury ([place])   Rhabdomyolysis   Suicidal overdose ([place])   Acute hepatitis   Schizoaffective disorder, bipolar type ([place])   Acute renal failure (ARF) ([place])   Suicidal ideations   Chronic 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 coma ([place])    Dylan [name] is a 52 y.o. male with  rhabdomyolysis after cocaine use and overdose of unknown prescription, with schizoaffective disorder homicidal and suicidal ideation and HIV disease that was being managed with BIKTARVY while he was incarcerated.  1.  Rhabdomyolysis: I continue to think that cocaine is most likely the precipitant for this.  As mentioned before there have been cases of integrase strand transfer inhibitors such as raltegravir M (ISENTRESS ) dolutegravir (TIVICAY) causing this but this is a very rare phenomena.  For clarity sake I did take him off his BIKTARVY.  As discussed previously I also do not have much concerned that TAF will present sufficent active TNF to cause renal dysfunction but have also removed it w DC of BIKTARVY  I am concerned his CPK continues to be greater than 50,000.  Could he have compartment syndrome somewhere that needs surgical attention to correct this?  2.  Acute renal failure due to #1: It appears like he is heading for imminent hemodialysis.  3.  HIV disease we will continue him on PREZCOBIX renally dosed Epivir and AZT.  He was not happy about hearing that he was on AZT again though I emphasized to him that this was just for the time being until he got clarity about his trajectory of his renal disease.  I spoke with Sharyn Lull from [facility] and she and Karn Pickler will work on trying to get him HIV medication assistance program.  I will also work with [place] and them to obtain antiretrovirals for the patient's before he leaves.  At present however is not clear what regimen he will be on and I would prefer to have him on a single tablet regimen in terms of simplicity of obtaining the drug in terms of his own satisfaction.  One option might be SYMTUZA (DRV/COBI-FTC-TAF) but not until we can figure this out and that drug wont be covered by [facility] when he gets through first month  Awaiting viral load results.  4.  Homelessness: This is something that [facility] [employer] can work on as an outpatient as well although I prefer him to have some type of stable housing before a discharge if it is at all possible.  5.  Comorbid hepatitis B: There is a risk of this flaring when he is off of tenofovir.  6.  Depression with schizoaffective disorder and suicidal and homicidal ideation: Deferred to psychiatry.  He will need close follow-up for this.     LOS: 3 days   Alcide Evener 02/22/2017, 3:45 PM

## 2017-02-22 NOTE — Progress Notes (Signed)
PROGRESS NOTE    Dylan [name]  VHQ:[id number] DOB: [birth year] DOA: 02/19/2017 PCP: Carlyle Basques, MD    Brief Narrative: The patient is a 52 year old male with history of HIV, schizophrenia who presented with a 3-week history of suicidal and homicidal ideation.  He took a bunch of pills for friend 2 days prior to admission and had been doing cocaine.  He felt like he wanted to kill himself and other people.  He came to the emergency department because his urine was very dark and he had not been making much urine.  He was found to have rhabdomyolysis with acute renal failure.  He was started on IV fluids but his creatinine continued to rise and he was essentially anuric.  He is being transferred to [facility] for hemodialysis.    Assessment & Plan:   Principal Problem:   Schizoaffective disorder, depressive type ([place]) Active Problems:   HIV disease ([place])   Cocaine abuse ([place])   Acute kidney injury ([place])   Rhabdomyolysis   Suicidal overdose ([place])   Acute hepatitis   Schizoaffective disorder, bipolar type ([place])   Acute renal failure (ARF) ([place])   Suicidal ideations   Chronic 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 coma ([place])  1-AKI; Secondary to Rhabdomyolysis.  -CK above 50,000.  -Nephrology consulted. IV fluids discontinue. Patient to received one dose of lasix.  -Nurse will contact Nephrology regarding need for foley  catheter.  -Renal US; negative for hydronephrosis.  Will likely need HD.   Feet pain. ; he is complaining of feet pain. No sign or symptoms of compartment syndrome. No LE pain on range of motion. He denies calf leg pain. He has feet pain. Pulse presents,.   Suicidal overdose;  Sitter at bedside.  Continue to have suicidal thought. Patient has been IVC.  Psych consulted. Recommend inpatient psychiatric admission.  Started on zyprexa  Transaminases; could be related to muscle injury.   hepatitis panel ordered.  Trending down.   HIV;  Appreciate Dr [name] help.  Now on Prezcobix and renally dose AZT and 3 TC.  He will need Emergency ADAP program   Schizoaffective disorder, depressive type ([place]) -  started on Zyprexa  Cocaine use.    DVT prophylaxis: heparin Code Status: Full code.  Family Communication: no family at bedside.  Disposition Plan: remain inpatient. Patient with suicidal thought, renal failure, IVC paper was not completed yesterday. Will start process. Will also consult Psych.    Consultants:   Dr [employer].    Procedures:   none   Antimicrobials:  none   Subjective: He denies pain calf, or thigh. Complaints of pain in his feet  Report hallucination.   Objective: Vitals:   02/21/17 1747 02/21/17 2136 02/22/17 0526 02/22/17 0947  BP: 127/70 140/74 135/69 129/76  Pulse: 73 67 69 67  Resp: 18 18 18 18   Temp: 98.8 F (37.1 C) 98.6 F (37 C) 98.9 F (37.2 C) 98.5 F (36.9 C)  TempSrc: Oral Oral Oral Oral  SpO2: 95% 96% 94% 96%  Weight:      Height:        Intake/Output Summary (Last 24 hours) at 02/22/2017 1131 Last data filed at 02/22/2017 0900 Gross per 24 hour  Intake 920 ml  Output 75 ml  Net 845 ml   Filed Weights   02/19/17 1722 02/20/17 0258 02/20/17 2119  Weight: 97.5 kg (215 lb) 101.1 kg (222 lb 14.2 oz) 101.5 kg (223 lb 12.3 oz)  Examination:  General exam: NAD Respiratory system: CTA Cardiovascular system: S 1, S 2 RRR Gastrointestinal system: BS present, soft, nt Central nervous system: alert Extremities: Symmetric 5 x 5 power. Skin: No rashes, lesions or ulcers    Data Reviewed: I have personally reviewed following labs and imaging studies  CBC: Recent Labs  Lab 02/19/17 1742 02/20/17 0425  WBC 18.4* 12.6*  NEUTROABS 14.8*  --   HGB 15.6 13.1  HCT 46.8 37.1*  MCV 89.7 88.3  PLT 208 016*   Basic Metabolic Panel: Recent Labs  Lab 02/19/17 1742 02/20/17 0425 02/20/17 2047 02/21/17 0931 02/22/17 0536  NA 134* 134* 132* 132* 132*  K 4.9  3.8 3.4* 4.2 3.9  CL 96* 95* 85* 85* 85*  CO2 18* 22 30 28 26   GLUCOSE 90 120* 110* 124* 96  BUN 81* 53* 117* 131* 150*  CREATININE 4.30* 5.29* 7.06* 8.43* 10.90*  CALCIUM 7.8* 6.8* 6.6* 6.7* 6.9*  MG  --  2.8*  --   --   --   PHOS  --   --  6.1* 6.2*  --    GFR: Estimated Creatinine Clearance: 10.2 mL/min (A) (by C-G formula based on SCr of 10.9 mg/dL (H)). Liver Function Tests: Recent Labs  Lab 02/19/17 1742 02/20/17 0425 02/20/17 2047 02/21/17 0931 02/22/17 0536  AST 2,317* 1,449*  --  744* 451*  ALT 479* 359*  --  291* 236*  ALKPHOS 100 68  --  66 60  BILITOT 1.1 0.6  --  0.9 0.7  PROT 8.4* 6.1*  --  5.8* 5.6*  ALBUMIN 4.5 3.2* 2.8* 3.0*  2.9* 2.7*   Recent Labs  Lab 02/19/17 1847  LIPASE 64*   Recent Labs  Lab 02/19/17 2025  AMMONIA 43*   Coagulation Profile: Recent Labs  Lab 02/20/17 0425  INR 1.02   Cardiac Enzymes: Recent Labs  Lab 02/19/17 1742 02/19/17 2323 02/20/17 0425 02/21/17 0931 02/22/17 0536  CKTOTAL >50,000* >50,000* >50,000* >50,000* >50,000*   BNP (last 3 results) No results for input(s): PROBNP in the last 8760 hours. HbA1C: No results for input(s): HGBA1C in the last 72 hours. CBG: Recent Labs  Lab 02/20/17 0730 02/20/17 1208  GLUCAP 107* 80   Lipid Profile: No results for input(s): CHOL, HDL, LDLCALC, TRIG, CHOLHDL, LDLDIRECT in the last 72 hours. Thyroid Function Tests: No results for input(s): TSH, T4TOTAL, FREET4, T3FREE, THYROIDAB in the last 72 hours. Anemia Panel: No results for input(s): VITAMINB12, FOLATE, FERRITIN, TIBC, IRON, RETICCTPCT in the last 72 hours. Sepsis Labs: No results for input(s): PROCALCITON, LATICACIDVEN in the last 168 hours.  No results found for this or any previous visit (from the past 240 hour(s)).       Radiology Studies: US Renal  Result Date: 02/20/2017 CLINICAL DATA:  Acute renal failure. EXAM: RENAL / URINARY TRACT ULTRASOUND COMPLETE COMPARISON:  No recent prior. FINDINGS:  Right Kidney: Length: 12.7 cm. Echogenicity within normal limits. No mass or hydronephrosis visualized. Left Kidney: Length: 12.8 cm. Echogenicity within normal limits. No mass or hydronephrosis visualized. Bladder: Foley catheter in the bladder.  Bladder is decompressed. IMPRESSION: No acute or focal abnormality identified. Electronically Signed   By: Marcello Moores  Register   On: 02/20/2017 11:41        Scheduled Meds: . darunavir-cobicistat  1 tablet Oral Q breakfast  . [START ON 02/23/2017] heparin injection (subcutaneous)  5,000 Units Subcutaneous Q8H  . lamiVUDine  50 mg Oral Daily  . OLANZapine  5 mg Oral QHS  .  zidovudine  300 mg Oral Daily   Continuous Infusions:   LOS: 3 days    Time spent: 35 minutes    Elmarie Shiley, MD [facility] Pager [phone]  If 7PM-7AM, please contact night-coverage [email or web address] Password [facility] 02/22/2017, 11:31 AM

## 2017-02-22 NOTE — H&P (Signed)
Chief Complaint: Patient was seen in consultation today for renal failure  Referring Physician(s): Dr. Justin Mend  Supervising Physician: Corrie Mckusick  Patient Status: [facility] - In-pt  History of Present Illness: Dylan [name] is a 52 y.o. male with past medical history of Hep B, HIV, schizophrenia who was admitted for [facility] for rhabdomyolysis and acute renal failure after suicide attempt. IR consulted for temporary dialysis catheter placement.   Patient has been NPO.  He is getting heparin injections.   Past Medical History:  Diagnosis Date  . Arthritis   . Bipolar 1 disorder ([place])   . Depression   . Hepatitis B   . Human immunodeficiency virus (HIV) ([place])   . Schizophrenia [facility])     Past Surgical History:  Procedure Laterality Date  . TUMOR REMOVAL     From Chest    Allergies: Penicillins; Latex; and Tape  Medications: Prior to Admission medications   Medication Sig Start Date End Date Taking? Authorizing Provider  benztropine (COGENTIN) 1 MG tablet Take 1 tablet (1 mg total) by mouth 2 (two) times daily in the am and at bedtime.. To prevent drug induced involuntary movement 04/19/12  Yes Nwoko, Herbert Pun I, NP  bictegravir-emtricitabine-tenofovir AF (BIKTARVY) 50-200-25 MG TABS tablet Take 1 tablet by mouth daily.   Yes [provider]  traZODone (DESYREL) 100 MG tablet Take 2 tablets (200 mg total) by mouth at bedtime. For insomnia. 04/19/12  Yes Mashburn, Marlane Hatcher, PA-C  albuterol (PROVENTIL HFA;VENTOLIN HFA) 108 (90 BASE) MCG/ACT inhaler Inhale 2 puffs into the lungs every 6 (six) hours as needed for wheezing or shortness of breath. 09/14/11 09/13/12  Readling, Milana Huntsman, MD  citalopram (CELEXA) 40 MG tablet Take 1 tablet (40 mg total) by mouth daily with breakfast. For depression. 04/19/12 04/19/13  Encarnacion Slates, NP     History reviewed. No pertinent family history.  Social History   Socioeconomic History  . Marital status: Married    Spouse name: None  . Number  of children: None  . Years of education: None  . Highest education level: None  Social Needs  . Financial resource strain: None  . Food insecurity - worry: None  . Food insecurity - inability: None  . Transportation needs - medical: None  . Transportation needs - non-medical: None  Occupational History  . None  Tobacco Use  . Smoking status: Current Every Day Smoker    Packs/day: 1.00    Years: 15.00    Pack years: 15.00    Types: Cigarettes  . Smokeless tobacco: Current User  Substance and Sexual Activity  . Alcohol use: No    Alcohol/week: 0.0 oz    Comment: past  . Drug use: Yes    Types: Cocaine    Comment: past  . Sexual activity: No  Other Topics Concern  . None  Social History Narrative  . None     Review of Systems: A 12 point ROS discussed and pertinent positives are indicated in the HPI above.  All other systems are negative.  Review of Systems  Constitutional: Positive for fatigue. Negative for fever.  Respiratory: Negative for cough and shortness of breath.   Cardiovascular: Negative for chest pain.  Skin: Negative for color change, pallor and rash.  Psychiatric/Behavioral: Negative for behavioral problems and confusion.    Vital Signs: BP 129/76 (BP Location: Left Arm)   Pulse 67   Temp 98.5 F (36.9 C) (Oral)   Resp 18   Ht 6\' 2"  (1.88  m)   Wt 223 lb 12.3 oz (101.5 kg)   SpO2 96%   BMI 28.73 kg/m   Physical Exam  Constitutional: He is oriented to person, place, and time. He appears well-developed.  Cardiovascular: Normal rate.  Pulmonary/Chest: Effort normal and breath sounds normal.  Musculoskeletal: Normal range of motion.  Neurological: He is alert and oriented to person, place, and time.  Skin: Skin is warm and dry.  Psychiatric: He has a normal mood and affect. His behavior is normal. Judgment and thought content normal.  Nursing note and vitals reviewed.      Imaging: Dg Chest 2 View  Result Date: 02/19/2017 CLINICAL DATA:   52 year old male with a history of renal failure and leukocytosis EXAM: CHEST  2 VIEW COMPARISON:  04/21/2012 FINDINGS: Cardiomediastinal silhouette unchanged in size and contour. No evidence of central vascular congestion. No pneumothorax or pleural effusion. No confluent airspace disease. Coarsened interstitial markings. No displaced fracture IMPRESSION: Chronic lung changes without evidence of superimposed acute cardiopulmonary disease. Electronically Signed   By: Corrie Mckusick D.O.   On: 02/19/2017 21:06   Ct Head Wo Contrast  Result Date: 02/19/2017 CLINICAL DATA:  52 year old male with altered mental status. Suicidal ideation. HIV. EXAM: CT HEAD WITHOUT CONTRAST TECHNIQUE: Contiguous axial images were obtained from the base of the skull through the vertex without intravenous contrast. COMPARISON:  Report of noncontrast head CT 06/28/2000 (no images available). FINDINGS: Brain: Cerebral volume is within normal limits. No midline shift, ventriculomegaly, mass effect, evidence of mass lesion, intracranial hemorrhage or evidence of cortically based acute infarction. Gray-white matter differentiation is within normal limits throughout the brain. Vascular: No suspicious intracranial vascular hyperdensity. Skull: Chronic left lamina papyracea fracture. No acute osseous abnormality identified. Sinuses/Orbits: Clear, aside from left ethmoid effacement due to the chronic lamina papyracea fracture. Other: Negative visible orbit and scalp soft tissues. IMPRESSION: Negative noncontrast head CT. Electronically Signed   By: Genevie Ann M.D.   On: 02/19/2017 20:59   US Renal  Result Date: 02/20/2017 CLINICAL DATA:  Acute renal failure. EXAM: RENAL / URINARY TRACT ULTRASOUND COMPLETE COMPARISON:  No recent prior. FINDINGS: Right Kidney: Length: 12.7 cm. Echogenicity within normal limits. No mass or hydronephrosis visualized. Left Kidney: Length: 12.8 cm. Echogenicity within normal limits. No mass or hydronephrosis  visualized. Bladder: Foley catheter in the bladder.  Bladder is decompressed. IMPRESSION: No acute or focal abnormality identified. Electronically Signed   By: Marcello Moores  Register   On: 02/20/2017 11:41    Labs:  CBC: Recent Labs    02/19/17 1742 02/20/17 0425  WBC 18.4* 12.6*  HGB 15.6 13.1  HCT 46.8 37.1*  PLT 208 142*    COAGS: Recent Labs    02/20/17 0425  INR 1.02    BMP: Recent Labs    02/20/17 0425 02/20/17 2047 02/21/17 0931 02/22/17 0536  NA 134* 132* 132* 132*  K 3.8 3.4* 4.2 3.9  CL 95* 85* 85* 85*  CO2 22 30 28 26   GLUCOSE 120* 110* 124* 96  BUN 53* 117* 131* 150*  CALCIUM 6.8* 6.6* 6.7* 6.9*  CREATININE 5.29* 7.06* 8.43* 10.90*  GFRNONAA 11* 8* 6* 5*  GFRAA 13* 9* 8* 6*    LIVER FUNCTION TESTS: Recent Labs    02/19/17 1742 02/20/17 0425 02/20/17 2047 02/21/17 0931 02/22/17 0536  BILITOT 1.1 0.6  --  0.9 0.7  AST 2,317* 1,449*  --  744* 451*  ALT 479* 359*  --  291* 236*  ALKPHOS 100 68  --  66 60  PROT 8.4* 6.1*  --  5.8* 5.6*  ALBUMIN 4.5 3.2* 2.8* 3.0*  2.9* 2.7*    TUMOR MARKERS: No results for input(s): AFPTM, CEA, CA199, CHROMGRNA in the last 8760 hours.  Assessment and Plan: Acute Renal Failure Patient in need of dialysis; request made to IR to place temporary catheter.  Patient has been evaluated by Psych.  He is to IVC for recent SI attempt but appears to have decision making capacity.  Risks and benefits discussed with the patient including, but not limited to bleeding, infection, vascular injury.  All of the patient's questions were answered, patient is agreeable to proceed. Consent signed and in chart.  Thank you for this interesting consult.  I greatly enjoyed meeting ROURKE MCQUITTY and look forward to participating in their care.  A copy of this report was sent to the requesting provider on this date.  Electronically Signed: Docia Barrier, PA 02/22/2017, 11:39 AM   I spent a total of 20 Minutes    in face  to face in clinical consultation, greater than 50% of which was counseling/coordinating care for acute renal failure.

## 2017-02-23 ENCOUNTER — Inpatient Hospital Stay (HOSPITAL_COMMUNITY): Payer: Self-pay

## 2017-02-23 DIAGNOSIS — B181 Chronic viral hepatitis B without delta-agent: Secondary | ICD-10-CM

## 2017-02-23 LAB — RENAL FUNCTION PANEL
ANION GAP: 24 — AB (ref 5–15)
Albumin: 2.8 g/dL — ABNORMAL LOW (ref 3.5–5.0)
BUN: 171 mg/dL — ABNORMAL HIGH (ref 6–20)
CALCIUM: 7 mg/dL — AB (ref 8.9–10.3)
CHLORIDE: 85 mmol/L — AB (ref 101–111)
CO2: 22 mmol/L (ref 22–32)
CREATININE: 13.4 mg/dL — AB (ref 0.61–1.24)
GFR, EST AFRICAN AMERICAN: 4 mL/min — AB (ref >60)
GFR, EST NON AFRICAN AMERICAN: 4 mL/min — AB (ref >60)
Glucose, Bld: 119 mg/dL — ABNORMAL HIGH (ref 65–99)
Phosphorus: 7.3 mg/dL — ABNORMAL HIGH (ref 2.5–4.6)
Potassium: 4 mmol/L (ref 3.5–5.1)
SODIUM: 131 mmol/L — AB (ref 135–145)

## 2017-02-23 LAB — CK: CK TOTAL: 25509 U/L — AB (ref 49–397)

## 2017-02-23 MED ORDER — SIMETHICONE 80 MG PO CHEW
80.0000 mg | CHEWABLE_TABLET | Freq: Four times a day (QID) | ORAL | Status: DC | PRN
  Administered 2017-02-23 – 2017-02-25 (×4): 80 mg via ORAL
  Filled 2017-02-23 (×4): qty 1

## 2017-02-23 MED ORDER — ONDANSETRON HCL 4 MG/2ML IJ SOLN
4.0000 mg | Freq: Four times a day (QID) | INTRAMUSCULAR | Status: DC | PRN
  Administered 2017-02-23: 4 mg via INTRAVENOUS
  Filled 2017-02-23: qty 2

## 2017-02-23 MED ORDER — TRAMADOL HCL 50 MG PO TABS
50.0000 mg | ORAL_TABLET | Freq: Three times a day (TID) | ORAL | Status: DC | PRN
  Administered 2017-02-23 – 2017-03-13 (×11): 50 mg via ORAL
  Filled 2017-02-23 (×12): qty 1

## 2017-02-23 NOTE — Progress Notes (Addendum)
Subjective:  Still has pain in his feet and his calves and thighs..   Antibiotics:  Anti-infectives (From admission, onward)   Start     Dose/Rate Route Frequency Ordered Stop   02/22/17 1000  bictegravir-emtricitabine-tenofovir AF (BIKTARVY) 50-200-25 MG per tablet 1 tablet  Status:  Discontinued     1 tablet Oral Daily 02/21/17 1418 02/21/17 1618   02/22/17 1000  zidovudine (RETROVIR) capsule 300 mg     300 mg Oral Daily 02/21/17 1951     02/22/17 1000  lamiVUDine (EPIVIR) 10 MG/ML solution 50 mg     50 mg Oral Daily 02/21/17 1951     02/22/17 0800  dolutegravir (TIVICAY) tablet 50 mg  Status:  Discontinued     50 mg Oral Daily with breakfast 02/21/17 1619 02/21/17 1948   02/22/17 0800  darunavir-cobicistat (PREZCOBIX) 800-150 MG per tablet 1 tablet     1 tablet Oral Daily with breakfast 02/21/17 1619     02/20/17 1700  bictegravir-emtricitabine-tenofovir AF (BIKTARVY) 50-200-25 MG per tablet 1 tablet  Status:  Discontinued     1 tablet Oral Daily 02/20/17 1503 02/21/17 1242      Medications: Scheduled Meds: . darunavir-cobicistat  1 tablet Oral Q breakfast  . heparin injection (subcutaneous)  5,000 Units Subcutaneous Q8H  . lamiVUDine  50 mg Oral Daily  . OLANZapine  5 mg Oral QHS  . zidovudine  300 mg Oral Daily   Continuous Infusions: . furosemide 120 mg (02/23/17 0323)   PRN Meds:.albuterol, lidocaine, ondansetron (ZOFRAN) IV, simethicone, traMADol    Objective: Weight change:   Intake/Output Summary (Last 24 hours) at 02/23/2017 1444 Last data filed at 02/23/2017 1300 Gross per 24 hour  Intake 1552 ml  Output 275 ml  Net 1277 ml   Blood pressure (!) 148/75, pulse 71, temperature 98.2 F (36.8 C), temperature source Oral, resp. rate 18, height '6\' 2"'$  (1.88 m), weight 242 lb 11.6 oz (110.1 kg), SpO2 91 %. Temp:  [98.1 F (36.7 C)-98.6 F (37 C)] 98.2 F (36.8 C) (02/15 0856) Pulse Rate:  [69-71] 71 (02/15 0856) Resp:  [17-18] 18 (02/15 0856) BP:  (136-151)/(56-75) 148/75 (02/15 0856) SpO2:  [91 %-99 %] 91 % (02/15 0856) Weight:  [242 lb 11.6 oz (110.1 kg)] 242 lb 11.6 oz (110.1 kg) (02/14 2040)  Physical Exam: General: Alert and awake, oriented x3, not in any acute distress, dysphoric HEENT: anicteric sclera, , EOMI CVS regular rate, normal r,  no murmur rubs or gallops Chest: clear to auscultation bilaterally, no wheezing, rales or rhonchi Abdomen: soft nontender, nondistended, normal bowel sounds, Extremities: He is tender to palpation on his thighs and calves but no clear evidence of compartment syndrome. Skin: no rashes Neuro: nonfocal  CBC: CBC Latest Ref Rng & Units 02/20/2017 02/19/2017 04/11/2012  WBC 4.0 - 10.5 K/uL 12.6(H) 18.4(H) 9.2  Hemoglobin 13.0 - 17.0 g/dL 13.1 15.6 15.0  Hematocrit 39.0 - 52.0 % 37.1(L) 46.8 41.9  Platelets 150 - 400 K/uL 142(L) 208 169     BMET Recent Labs    02/22/17 0536 02/23/17 0626  NA 132* 131*  K 3.9 4.0  CL 85* 85*  CO2 26 22  GLUCOSE 96 119*  BUN 150* 171*  CREATININE 10.90* 13.40*  CALCIUM 6.9* 7.0*     Liver Panel  Recent Labs    02/21/17 0931 02/22/17 0536 02/23/17 0626  PROT 5.8* 5.6*  --   ALBUMIN 3.0*  2.9* 2.7* 2.8*  AST 744*  451*  --   ALT 291* 236*  --   ALKPHOS 66 60  --   BILITOT 0.9 0.7  --   BILIDIR 0.2 0.1  --   IBILI 0.7  --   --        Sedimentation Rate No results for input(s): ESRSEDRATE in the last 72 hours. C-Reactive Protein No results for input(s): CRP in the last 72 hours.  Micro Results: No results found for this or any previous visit (from the past 720 hour(s)).  Studies/Results: Dg Abd 1 View  Result Date: 02/23/2017 CLINICAL DATA:  Increased vomiting EXAM: ABDOMEN - 1 VIEW COMPARISON:  None. FINDINGS: Scattered large and small bowel gas is noted. No free air is noted. No obstructive changes are seen. No bony abnormality is noted. IMPRESSION: No acute abnormality seen. Electronically Signed   By: Inez Catalina M.D.   On:  02/23/2017 08:36   Ir Fluoro Guide Cv Line Right  Result Date: 02/22/2017 INDICATION: 52 year old male with a history of renal disease EXAM: IMAGE GUIDED TEMPORARY HEMODIALYSIS CATHETER PLACEMENT MEDICATIONS: None ANESTHESIA/SEDATION: None FLUOROSCOPY TIME:  Fluoroscopy Time: 0 minutes 6 seconds (3 mGy). COMPLICATIONS: None PROCEDURE: Informed written consent was obtained from the patient after a discussion of the risks, benefits, and alternatives to treatment. Questions regarding the procedure were encouraged and answered. The right neck was prepped with chlorhexidine in a sterile fashion, and a sterile drape was applied covering the operative field. Maximum barrier sterile technique with sterile gowns and gloves were used for the procedure. A timeout was performed prior to the initiation of the procedure. A micropuncture kit was utilized to access the right internal jugular vein under direct, real-time ultrasound guidance after the overlying soft tissues were anesthetized with 1% lidocaine with epinephrine. Ultrasound image documentation was performed. The microwire was kinked to measure appropriate catheter length. A stiff glidewire was advanced to the level of the IVC. A 20 cm hemodialysis catheter was then placed over the wire. Final catheter positioning was confirmed and documented with a spot radiographic image. The catheter aspirates and flushes normally. The catheter was flushed with appropriate volume heparin dwells. Dressings were applied. The patient tolerated the procedure well without immediate post procedural complication. IMPRESSION: Status post placement of right IJ temporary hemodialysis catheter. Catheter may be converted if need be. Signed, Dulcy Fanny. Earleen Newport, DO [facility] Radiology Electronically Signed   By: Corrie Mckusick D.O.   On: 02/22/2017 17:14   Ir US Guide Vasc Access Right  Result Date: 02/22/2017 INDICATION: 52 year old male with  a history of renal disease EXAM: IMAGE GUIDED TEMPORARY HEMODIALYSIS CATHETER PLACEMENT MEDICATIONS: None ANESTHESIA/SEDATION: None FLUOROSCOPY TIME:  Fluoroscopy Time: 0 minutes 6 seconds (3 mGy). COMPLICATIONS: None PROCEDURE: Informed written consent was obtained from the patient after a discussion of the risks, benefits, and alternatives to treatment. Questions regarding the procedure were encouraged and answered. The right neck was prepped with chlorhexidine in a sterile fashion, and a sterile drape was applied covering the operative field. Maximum barrier sterile technique with sterile gowns and gloves were used for the procedure. A timeout was performed prior to the initiation of the procedure. A micropuncture kit was utilized to access the right internal jugular vein under direct, real-time ultrasound guidance after the overlying soft tissues were anesthetized with 1% lidocaine with epinephrine. Ultrasound image documentation was performed. The microwire was kinked to measure appropriate catheter length. A stiff glidewire was advanced to the level of the IVC. A 20 cm  hemodialysis catheter was then placed over the wire. Final catheter positioning was confirmed and documented with a spot radiographic image. The catheter aspirates and flushes normally. The catheter was flushed with appropriate volume heparin dwells. Dressings were applied. The patient tolerated the procedure well without immediate post procedural complication. IMPRESSION: Status post placement of right IJ temporary hemodialysis catheter. Catheter may be converted if need be. Signed, Dulcy Fanny. Earleen Newport, DO [facility] Radiology Electronically Signed   By: Corrie Mckusick D.O.   On: 02/22/2017 17:14      Assessment/Plan:  INTERVAL HISTORY: CPK is trending down but his renal function unfortunately has not improved and he is going to have dialysis today.   Principal Problem:   Schizoaffective  disorder, depressive type ([place]) Active Problems:   HIV disease ([place])   Cocaine abuse ([place])   AKI (acute kidney injury) ([place])   Rhabdomyolysis   Suicidal overdose ([place])   Acute hepatitis   Schizoaffective disorder, bipolar type ([place])   Acute renal failure (ARF) ([place])   Suicidal ideations   Chronic 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 coma ([place])    OLUWADAMILARE TOBLER is a 52 y.o. male with rhabdomyolysis after cocaine use and overdose of unknown prescription, with schizoaffective disorder homicidal and suicidal ideation and HIV disease that was being managed with BIKTARVY while he was incarcerated.  1.  Rhabdomyolysis: I continue to think that cocaine is most likely the precipitant for this.  As mentioned before there have been cases of integrase strand transfer inhibitors such as raltegravir M (ISENTRESS ) dolutegravir (TIVICAY) causing this but this is a very rare phenomena.  For clarity sake I did take him off his BIKTARVY.  As discussed previously I also do not have much concerned that TAF will present sufficent active TNF to cause renal dysfunction but have also removed it w DC of BIKTARVY    2.  Acute renal failure due to #1: It appears like he is heading for imminent hemodialysis.  Will be important to [place] in terms of management of his HIV disease to understand whether hemodialysis will be likely a long-term need for him or whether there is any hope of renal recovery in this patient.  3.  HIV disease we will continue him on PREZCOBIX renally dosed Epivir and AZT.  He was not happy about hearing that he was on AZT again though I emphasized to him that this was just for the time being until he got clarity about his trajectory of his renal disease.  I spoke with Alroy Dust from [place] and and he will work on trying to get him HIV medication assistance program.  I will also work with [place] and them to obtain antiretrovirals for the patient's before  he leaves.  At present however is not clear what regimen he will be on and I would prefer to have him on a single tablet regimen in terms of simplicity of obtaining the drug in terms of his own satisfaction.  One option might be SYMTUZA (DRV/COBI-FTC-TAF) but not until we can figure this out and that drug wont be covered by [facility] when he gets through first month  He has VL that is <20 so he was being up front about his adherence. CD4 320 but could be depressed from acute illness  4.  Homelessness: This is something that [facility] [employer] can work on as an outpatient as well although I prefer him to have some  type of stable housing before a discharge if it is at all possible.  Can case management work on this as the inpatient it seems incredibly impractical of this guy is discharged to living on the street and expected to follow-up regularly in dialysis.  5.  Comorbid hepatitis B: He was hepatitis B surface antigen positive in 2013 there is a risk of this flaring when he is off of tenofovir.  I would treat him as a positive hepatitis B patient in terms of his dialysis.  6.  Depression with schizoaffective disorder and suicidal and homicidal ideation: Deferred to psychiatry.  He will need close follow-up for this.  I spent greater than 35 minutes with the patient including greater than 50% of time in face to face counsel of the patient re his HIV disease, ARF, rhabdo and in coordination of his care with Scarlette Calico.   Available for questions over the weekend and Dr. Baxter Flattery will take over the service on Monday    LOS: 4 days   Alcide Evener 02/23/2017, 2:44 PM

## 2017-02-23 NOTE — Clinical Social Work Note (Signed)
CSW continuing to follow patient medical progress. Patient was IV-C'd on 02/21/17 and IV-C paperwork in patient's chart. Patient is need of dialysis and per nephrology note, "Renal function continues to decline and dialysis  Will be needed" CSW will continue to monitor patient's progress, and begin search for a psych bed once patient is medically stable.  Aidah Forquer Givens, MSW, LCSW Licensed Clinical Social Worker [place] [phone]

## 2017-02-23 NOTE — Progress Notes (Signed)
PROGRESS NOTE    Dylan [name]  XBW:[id number] DOB: 1966/01/06 DOA: 02/19/2017 PCP: Carlyle Basques, MD    Brief Narrative: The patient is a 52 year old male with history of HIV, schizophrenia who presented with a 3-week history of suicidal and homicidal ideation.  He took a bunch of pills for friend 2 days prior to admission and had been doing cocaine.  He felt like he wanted to kill himself and other people.  He came to the emergency department because his urine was very dark and he had not been making much urine.  He was found to have rhabdomyolysis with acute renal failure.  He was started on IV fluids but his creatinine continued to rise and he was essentially anuric.  He is being transferred to [facility] for hemodialysis.    Assessment & Plan:   Principal Problem:   Schizoaffective disorder, depressive type ([place]) Active Problems:   HIV disease ([place])   Cocaine abuse ([place])   AKI (acute kidney injury) ([place])   Rhabdomyolysis   Suicidal overdose ([place])   Acute hepatitis   Schizoaffective disorder, bipolar type ([place])   Acute renal failure (ARF) ([place])   Suicidal ideations   Chronic 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 coma ([place])  1-AKI; Secondary to Rhabdomyolysis. Secondary to cocaine.  -CK above 50,000. ---25,000 -Nephrology consulted. IV fluids discontinue. Patient to received one dose of lasix.  -Nurse will contact Nephrology regarding need for foley  catheter.  -Renal US; negative for hydronephrosis.  -plan to start HD>   Feet pain. ; he is complaining of feet pain. No sign or symptoms of compartment syndrome. No LE pain on range of motion. He denies calf, leg pain. He has feet pain. Pulse presents,.   Suicidal overdose;  Sitter at bedside.  Continue to have suicidal thought. Patient has been IVC.  Psych consulted. Recommend inpatient psychiatric admission.  Started on zyprexa. monitor QT on EKG. QT 480, repeat EKG in am.   Transaminases; could be  related to muscle injury.   hepatitis panel ordered.  Trending down.   HIV; Appreciate Dr [name] help.  Now on Prezcobix, Epivir, Retrovir.  He will need Emergency ADAP program  HIV medications at discharge to be determine, depending on need for HD.   Schizoaffective disorder, depressive type ([place]) -  started on Zyprexa  Abdominal pain, nausea, vomiting.  KUB negative,. Had BM.  Suspect uremic symptoms.   Hepatitis B;  Cocaine use.    DVT prophylaxis: heparin Code Status: Full code.  Family Communication: no family at bedside.  Disposition Plan: remain inpatient. Patient with suicidal thought, renal failure, IVC paper was not completed yesterday. Will start process. Will also consult Psych.    Consultants:   Dr [employer].    Procedures:   none   Antimicrobials:  none   Subjective: He is sitting in chair. He is complaining of nausea and vomited today. Complaints of abdominal cramps. Denies leg pain .  Objective: Vitals:   02/22/17 2040 02/22/17 2207 02/23/17 0516 02/23/17 0856  BP: 139/74 (!) 151/56 136/71 (!) 148/75  Pulse: 70 70 69 71  Resp: '18 17 18 18  '$ Temp: 98.6 F (37 C) 98.1 F (36.7 C) 98.1 F (36.7 C) 98.2 F (36.8 C)  TempSrc: Oral Oral Oral Oral  SpO2: 92% 99% 96% 91%  Weight: 110.1 kg (242 lb 11.6 oz)     Height:        Intake/Output Summary (Last 24 hours) at 02/23/2017 1544 Last  data filed at 02/23/2017 1300 Gross per 24 hour  Intake 1552 ml  Output 275 ml  Net 1277 ml   Filed Weights   02/20/17 0258 02/20/17 2119 02/22/17 2040  Weight: 101.1 kg (222 lb 14.2 oz) 101.5 kg (223 lb 12.3 oz) 110.1 kg (242 lb 11.6 oz)    Examination:  General exam: NAD Respiratory system: CTA Cardiovascular system: S 1, S 2 RRR Gastrointestinal system: BS present, soft, nt Central nervous system: alert  Extremities: Symmetric power.  Skin: no rash     Data Reviewed: I have personally reviewed following labs and imaging studies  CBC: Recent Labs   Lab 02/19/17 1742 02/20/17 0425  WBC 18.4* 12.6*  NEUTROABS 14.8*  --   HGB 15.6 13.1  HCT 46.8 37.1*  MCV 89.7 88.3  PLT 208 174*   Basic Metabolic Panel: Recent Labs  Lab 02/20/17 0425 02/20/17 2047 02/21/17 0931 02/22/17 0536 02/23/17 0626  NA 134* 132* 132* 132* 131*  K 3.8 3.4* 4.2 3.9 4.0  CL 95* 85* 85* 85* 85*  CO2 '22 30 28 26 22  '$ GLUCOSE 120* 110* 124* 96 119*  BUN 53* 117* 131* 150* 171*  CREATININE 5.29* 7.06* 8.43* 10.90* 13.40*  CALCIUM 6.8* 6.6* 6.7* 6.9* 7.0*  MG 2.8*  --   --   --   --   PHOS  --  6.1* 6.2*  --  7.3*   GFR: Estimated Creatinine Clearance: 8.6 mL/min (A) (by C-G formula based on SCr of 13.4 mg/dL (H)). Liver Function Tests: Recent Labs  Lab 02/19/17 1742 02/20/17 0425 02/20/17 2047 02/21/17 0931 02/22/17 0536 02/23/17 0626  AST 2,317* 1,449*  --  744* 451*  --   ALT 479* 359*  --  291* 236*  --   ALKPHOS 100 68  --  66 60  --   BILITOT 1.1 0.6  --  0.9 0.7  --   PROT 8.4* 6.1*  --  5.8* 5.6*  --   ALBUMIN 4.5 3.2* 2.8* 3.0*  2.9* 2.7* 2.8*   Recent Labs  Lab 02/19/17 1847  LIPASE 64*   Recent Labs  Lab 02/19/17 2025  AMMONIA 43*   Coagulation Profile: Recent Labs  Lab 02/20/17 0425  INR 1.02   Cardiac Enzymes: Recent Labs  Lab 02/19/17 2323 02/20/17 0425 02/21/17 0931 02/22/17 0536 02/23/17 0626  CKTOTAL >50,000* >50,000* >50,000* >50,000* 25,509*   BNP (last 3 results) No results for input(s): PROBNP in the last 8760 hours. HbA1C: No results for input(s): HGBA1C in the last 72 hours. CBG: Recent Labs  Lab 02/20/17 0730 02/20/17 1208  GLUCAP 107* 80   Lipid Profile: No results for input(s): CHOL, HDL, LDLCALC, TRIG, CHOLHDL, LDLDIRECT in the last 72 hours. Thyroid Function Tests: No results for input(s): TSH, T4TOTAL, FREET4, T3FREE, THYROIDAB in the last 72 hours. Anemia Panel: No results for input(s): VITAMINB12, FOLATE, FERRITIN, TIBC, IRON, RETICCTPCT in the last 72 hours. Sepsis Labs: No  results for input(s): PROCALCITON, LATICACIDVEN in the last 168 hours.  No results found for this or any previous visit (from the past 240 hour(s)).       Radiology Studies: Dg Abd 1 View  Result Date: 02/23/2017 CLINICAL DATA:  Increased vomiting EXAM: ABDOMEN - 1 VIEW COMPARISON:  None. FINDINGS: Scattered large and small bowel gas is noted. No free air is noted. No obstructive changes are seen. No bony abnormality is noted. IMPRESSION: No acute abnormality seen. Electronically Signed   By: Linus Mako.D.  On: 02/23/2017 08:36   Ir Fluoro Guide Cv Line Right  Result Date: 02/22/2017 INDICATION: 52 year old male with a history of renal disease EXAM: IMAGE GUIDED TEMPORARY HEMODIALYSIS CATHETER PLACEMENT MEDICATIONS: None ANESTHESIA/SEDATION: None FLUOROSCOPY TIME:  Fluoroscopy Time: 0 minutes 6 seconds (3 mGy). COMPLICATIONS: None PROCEDURE: Informed written consent was obtained from the patient after a discussion of the risks, benefits, and alternatives to treatment. Questions regarding the procedure were encouraged and answered. The right neck was prepped with chlorhexidine in a sterile fashion, and a sterile drape was applied covering the operative field. Maximum barrier sterile technique with sterile gowns and gloves were used for the procedure. A timeout was performed prior to the initiation of the procedure. A micropuncture kit was utilized to access the right internal jugular vein under direct, real-time ultrasound guidance after the overlying soft tissues were anesthetized with 1% lidocaine with epinephrine. Ultrasound image documentation was performed. The microwire was kinked to measure appropriate catheter length. A stiff glidewire was advanced to the level of the IVC. A 20 cm hemodialysis catheter was then placed over the wire. Final catheter positioning was confirmed and documented with a spot radiographic image. The catheter aspirates and flushes normally. The catheter was flushed  with appropriate volume heparin dwells. Dressings were applied. The patient tolerated the procedure well without immediate post procedural complication. IMPRESSION: Status post placement of right IJ temporary hemodialysis catheter. Catheter may be converted if need be. Signed, Dulcy Fanny. Earleen Newport, DO [facility] Radiology Electronically Signed   By: Corrie Mckusick D.O.   On: 02/22/2017 17:14   Ir US Guide Vasc Access Right  Result Date: 02/22/2017 INDICATION: 52 year old male with a history of renal disease EXAM: IMAGE GUIDED TEMPORARY HEMODIALYSIS CATHETER PLACEMENT MEDICATIONS: None ANESTHESIA/SEDATION: None FLUOROSCOPY TIME:  Fluoroscopy Time: 0 minutes 6 seconds (3 mGy). COMPLICATIONS: None PROCEDURE: Informed written consent was obtained from the patient after a discussion of the risks, benefits, and alternatives to treatment. Questions regarding the procedure were encouraged and answered. The right neck was prepped with chlorhexidine in a sterile fashion, and a sterile drape was applied covering the operative field. Maximum barrier sterile technique with sterile gowns and gloves were used for the procedure. A timeout was performed prior to the initiation of the procedure. A micropuncture kit was utilized to access the right internal jugular vein under direct, real-time ultrasound guidance after the overlying soft tissues were anesthetized with 1% lidocaine with epinephrine. Ultrasound image documentation was performed. The microwire was kinked to measure appropriate catheter length. A stiff glidewire was advanced to the level of the IVC. A 20 cm hemodialysis catheter was then placed over the wire. Final catheter positioning was confirmed and documented with a spot radiographic image. The catheter aspirates and flushes normally. The catheter was flushed with appropriate volume heparin dwells. Dressings were applied. The patient tolerated the procedure well without  immediate post procedural complication. IMPRESSION: Status post placement of right IJ temporary hemodialysis catheter. Catheter may be converted if need be. Signed, Dulcy Fanny. Earleen Newport, DO [facility] Radiology Electronically Signed   By: Corrie Mckusick D.O.   On: 02/22/2017 17:14        Scheduled Meds: . darunavir-cobicistat  1 tablet Oral Q breakfast  . heparin injection (subcutaneous)  5,000 Units Subcutaneous Q8H  . lamiVUDine  50 mg Oral Daily  . OLANZapine  5 mg Oral QHS  . zidovudine  300 mg Oral Daily   Continuous Infusions: . furosemide 120 mg (  02/23/17 0323)     LOS: 4 days    Time spent: 35 minutes    Elmarie Shiley, MD [facility] Pager [phone]  If 7PM-7AM, please contact night-coverage [email or web address] Password [facility] 02/23/2017, 3:44 PM

## 2017-02-23 NOTE — Progress Notes (Signed)
[facility] ROUNDING NOTE   Subjective:     52 year old man with schizophrenia and HIV was admitted with acute kidney injury and elevated CPK > 50000 Baseline creatinine was 1 and increased to  Greater than 7      He has a foley and very minimal urine output   He has positive history for cocaine use    Also acute liver injury with AST and ALT elevation Continues on suicidal precautions   I have stopped IV fluids as it appears he has had sufficient volume and would not continue IV bicarbonate for fear of causing a metabolic alkalosis Renal function continues to decline and dialysis  Will be needed    We shall proceed with dialysis      Objective:  Vital signs in last 24 hours:  Temp:  [98.1 F (36.7 C)-98.6 F (37 C)] 98.2 F (36.8 C) (02/15 0856) Pulse Rate:  [69-71] 71 (02/15 0856) Resp:  [17-18] 18 (02/15 0856) BP: (136-151)/(56-75) 148/75 (02/15 0856) SpO2:  [91 %-99 %] 91 % (02/15 0856) Weight:  [242 lb 11.6 oz (110.1 kg)] 242 lb 11.6 oz (110.1 kg) (02/14 2040)  Weight change:  Filed Weights   02/20/17 0258 02/20/17 2119 02/22/17 2040  Weight: 222 lb 14.2 oz (101.1 kg) 223 lb 12.3 oz (101.5 kg) 242 lb 11.6 oz (110.1 kg)    Intake/Output: I/O last 3 completed shifts: In: 3536 [P.O.:1320; Other:360; IV Piggyback:112] Out: 300 [Urine:300]   Intake/Output this shift:  No intake/output data recorded.  CVS- RRR RS- CTA ABD- BS present soft non-distended EXT- no edema   Basic Metabolic Panel: Recent Labs  Lab 02/20/17 0425 02/20/17 2047 02/21/17 0931 02/22/17 0536 02/23/17 0626  NA 134* 132* 132* 132* 131*  K 3.8 3.4* 4.2 3.9 4.0  CL 95* 85* 85* 85* 85*  CO2 '22 30 28 26 22  '$ GLUCOSE 120* 110* 124* 96 119*  BUN 53* 117* 131* 150* 171*  CREATININE 5.29* 7.06* 8.43* 10.90* 13.40*  CALCIUM 6.8* 6.6* 6.7* 6.9* 7.0*  MG 2.8*  --   --   --   --   PHOS  --  6.1* 6.2*  --  7.3*    Liver Function Tests: Recent Labs  Lab 02/19/17 1742  02/20/17 0425 02/20/17 2047 02/21/17 0931 02/22/17 0536 02/23/17 0626  AST 2,317* 1,449*  --  744* 451*  --   ALT 479* 359*  --  291* 236*  --   ALKPHOS 100 68  --  66 60  --   BILITOT 1.1 0.6  --  0.9 0.7  --   PROT 8.4* 6.1*  --  5.8* 5.6*  --   ALBUMIN 4.5 3.2* 2.8* 3.0*  2.9* 2.7* 2.8*   Recent Labs  Lab 02/19/17 1847  LIPASE 64*   Recent Labs  Lab 02/19/17 2025  AMMONIA 43*    CBC: Recent Labs  Lab 02/19/17 1742 02/20/17 0425  WBC 18.4* 12.6*  NEUTROABS 14.8*  --   HGB 15.6 13.1  HCT 46.8 37.1*  MCV 89.7 88.3  PLT 208 142*    Cardiac Enzymes: Recent Labs  Lab 02/19/17 2323 02/20/17 0425 02/21/17 0931 02/22/17 0536 02/23/17 0626  CKTOTAL >50,000* >50,000* >50,000* >50,000* 25,509*    BNP: Invalid input(s): POCBNP  CBG: Recent Labs  Lab 02/20/17 0730 02/20/17 1208  GLUCAP 107* 80    Microbiology: No results found for this or any previous visit.  Coagulation Studies: No results for input(s): LABPROT, INR in the  last 72 hours.  Urinalysis: No results for input(s): COLORURINE, LABSPEC, PHURINE, GLUCOSEU, HGBUR, BILIRUBINUR, KETONESUR, PROTEINUR, UROBILINOGEN, NITRITE, LEUKOCYTESUR in the last 72 hours.  Invalid input(s): APPERANCEUR    Imaging: Dg Abd 1 View  Result Date: 02/23/2017 CLINICAL DATA:  Increased vomiting EXAM: ABDOMEN - 1 VIEW COMPARISON:  None. FINDINGS: Scattered large and small bowel gas is noted. No free air is noted. No obstructive changes are seen. No bony abnormality is noted. IMPRESSION: No acute abnormality seen. Electronically Signed   By: Inez Catalina M.D.   On: 02/23/2017 08:36   Ir Fluoro Guide Cv Line Right  Result Date: 02/22/2017 INDICATION: 52 year old male with a history of renal disease EXAM: IMAGE GUIDED TEMPORARY HEMODIALYSIS CATHETER PLACEMENT MEDICATIONS: None ANESTHESIA/SEDATION: None FLUOROSCOPY TIME:  Fluoroscopy Time: 0 minutes 6 seconds (3 mGy). COMPLICATIONS: None PROCEDURE: Informed written  consent was obtained from the patient after a discussion of the risks, benefits, and alternatives to treatment. Questions regarding the procedure were encouraged and answered. The right neck was prepped with chlorhexidine in a sterile fashion, and a sterile drape was applied covering the operative field. Maximum barrier sterile technique with sterile gowns and gloves were used for the procedure. A timeout was performed prior to the initiation of the procedure. A micropuncture kit was utilized to access the right internal jugular vein under direct, real-time ultrasound guidance after the overlying soft tissues were anesthetized with 1% lidocaine with epinephrine. Ultrasound image documentation was performed. The microwire was kinked to measure appropriate catheter length. A stiff glidewire was advanced to the level of the IVC. A 20 cm hemodialysis catheter was then placed over the wire. Final catheter positioning was confirmed and documented with a spot radiographic image. The catheter aspirates and flushes normally. The catheter was flushed with appropriate volume heparin dwells. Dressings were applied. The patient tolerated the procedure well without immediate post procedural complication. IMPRESSION: Status post placement of right IJ temporary hemodialysis catheter. Catheter may be converted if need be. Signed, Dulcy Fanny. Earleen Newport, DO [facility] Radiology Electronically Signed   By: Corrie Mckusick D.O.   On: 02/22/2017 17:14   Ir US Guide Vasc Access Right  Result Date: 02/22/2017 INDICATION: 52 year old male with a history of renal disease EXAM: IMAGE GUIDED TEMPORARY HEMODIALYSIS CATHETER PLACEMENT MEDICATIONS: None ANESTHESIA/SEDATION: None FLUOROSCOPY TIME:  Fluoroscopy Time: 0 minutes 6 seconds (3 mGy). COMPLICATIONS: None PROCEDURE: Informed written consent was obtained from the patient after a discussion of the risks, benefits, and alternatives to treatment.  Questions regarding the procedure were encouraged and answered. The right neck was prepped with chlorhexidine in a sterile fashion, and a sterile drape was applied covering the operative field. Maximum barrier sterile technique with sterile gowns and gloves were used for the procedure. A timeout was performed prior to the initiation of the procedure. A micropuncture kit was utilized to access the right internal jugular vein under direct, real-time ultrasound guidance after the overlying soft tissues were anesthetized with 1% lidocaine with epinephrine. Ultrasound image documentation was performed. The microwire was kinked to measure appropriate catheter length. A stiff glidewire was advanced to the level of the IVC. A 20 cm hemodialysis catheter was then placed over the wire. Final catheter positioning was confirmed and documented with a spot radiographic image. The catheter aspirates and flushes normally. The catheter was flushed with appropriate volume heparin dwells. Dressings were applied. The patient tolerated the procedure well without immediate post procedural complication. IMPRESSION: Status post placement of right IJ temporary  hemodialysis catheter. Catheter may be converted if need be. Signed, Dulcy Fanny. Earleen Newport, DO [facility] Radiology Electronically Signed   By: Corrie Mckusick D.O.   On: 02/22/2017 17:14     Medications:   . furosemide 120 mg (02/23/17 0323)   . darunavir-cobicistat  1 tablet Oral Q breakfast  . heparin injection (subcutaneous)  5,000 Units Subcutaneous Q8H  . lamiVUDine  50 mg Oral Daily  . OLANZapine  5 mg Oral QHS  . zidovudine  300 mg Oral Daily   albuterol, lidocaine, ondansetron (ZOFRAN) IV, simethicone, traMADol  Assessment/ Plan:   Acute kidney injury appears to have a CPK that is elevated tandem with increase in creatinine I suspect that this represents rhabdomyolysis  Urine showed dipstick positive blood with no  rbc. No hydronephrosis. Minimal urine output although no signs of fluid overload at this time.   Will stop lasix and start dialysis  Will start dialysis appreciate IR help  ID  Appreciate Dr [name] input  That has been very helpful     LOS: 4 Clyda Smyth W '@TODAY''@11'$ :44 AM

## 2017-02-24 LAB — ALT: ALT: 173 U/L — ABNORMAL HIGH (ref 17–63)

## 2017-02-24 LAB — HEPATITIS PANEL, ACUTE
HCV Ab: 0.1 s/co ratio (ref 0.0–0.9)
HEP B S AG: POSITIVE — AB
Hep A IgM: NEGATIVE
Hep B C IgM: NEGATIVE

## 2017-02-24 LAB — RENAL FUNCTION PANEL
ALBUMIN: 2.6 g/dL — AB (ref 3.5–5.0)
Albumin: 2.6 g/dL — ABNORMAL LOW (ref 3.5–5.0)
Albumin: 2.6 g/dL — ABNORMAL LOW (ref 3.5–5.0)
Anion gap: 22 — ABNORMAL HIGH (ref 5–15)
Anion gap: 23 — ABNORMAL HIGH (ref 5–15)
Anion gap: 23 — ABNORMAL HIGH (ref 5–15)
BUN: 167 mg/dL — AB (ref 6–20)
BUN: 168 mg/dL — ABNORMAL HIGH (ref 6–20)
BUN: 193 mg/dL — AB (ref 6–20)
CHLORIDE: 87 mmol/L — AB (ref 101–111)
CO2: 21 mmol/L — AB (ref 22–32)
CO2: 22 mmol/L (ref 22–32)
CO2: 23 mmol/L (ref 22–32)
CREATININE: 13.45 mg/dL — AB (ref 0.61–1.24)
Calcium: 6.9 mg/dL — ABNORMAL LOW (ref 8.9–10.3)
Calcium: 7.1 mg/dL — ABNORMAL LOW (ref 8.9–10.3)
Calcium: 7.2 mg/dL — ABNORMAL LOW (ref 8.9–10.3)
Chloride: 84 mmol/L — ABNORMAL LOW (ref 101–111)
Chloride: 89 mmol/L — ABNORMAL LOW (ref 101–111)
Creatinine, Ser: 13.56 mg/dL — ABNORMAL HIGH (ref 0.61–1.24)
Creatinine, Ser: 14.68 mg/dL — ABNORMAL HIGH (ref 0.61–1.24)
GFR calc Af Amer: 4 mL/min — ABNORMAL LOW (ref >60)
GFR calc non Af Amer: 3 mL/min — ABNORMAL LOW (ref >60)
GFR, EST AFRICAN AMERICAN: 4 mL/min — AB (ref >60)
GFR, EST AFRICAN AMERICAN: 4 mL/min — AB (ref >60)
GFR, EST NON AFRICAN AMERICAN: 4 mL/min — AB (ref >60)
GFR, EST NON AFRICAN AMERICAN: 4 mL/min — AB (ref >60)
Glucose, Bld: 102 mg/dL — ABNORMAL HIGH (ref 65–99)
Glucose, Bld: 95 mg/dL (ref 65–99)
Glucose, Bld: 97 mg/dL (ref 65–99)
PHOSPHORUS: 8.6 mg/dL — AB (ref 2.5–4.6)
POTASSIUM: 4.1 mmol/L (ref 3.5–5.1)
POTASSIUM: 4.4 mmol/L (ref 3.5–5.1)
Phosphorus: 7.6 mg/dL — ABNORMAL HIGH (ref 2.5–4.6)
Phosphorus: 7.7 mg/dL — ABNORMAL HIGH (ref 2.5–4.6)
Potassium: 4.2 mmol/L (ref 3.5–5.1)
SODIUM: 130 mmol/L — AB (ref 135–145)
Sodium: 131 mmol/L — ABNORMAL LOW (ref 135–145)
Sodium: 133 mmol/L — ABNORMAL LOW (ref 135–145)

## 2017-02-24 LAB — CBC
HEMATOCRIT: 32.5 % — AB (ref 39.0–52.0)
HEMATOCRIT: 33.3 % — AB (ref 39.0–52.0)
Hemoglobin: 11.4 g/dL — ABNORMAL LOW (ref 13.0–17.0)
Hemoglobin: 11.6 g/dL — ABNORMAL LOW (ref 13.0–17.0)
MCH: 29.9 pg (ref 26.0–34.0)
MCH: 29.9 pg (ref 26.0–34.0)
MCHC: 34.8 g/dL (ref 30.0–36.0)
MCHC: 35.1 g/dL (ref 30.0–36.0)
MCV: 85.3 fL (ref 78.0–100.0)
MCV: 85.8 fL (ref 78.0–100.0)
PLATELETS: 141 10*3/uL — AB (ref 150–400)
Platelets: 147 10*3/uL — ABNORMAL LOW (ref 150–400)
RBC: 3.81 MIL/uL — ABNORMAL LOW (ref 4.22–5.81)
RBC: 3.88 MIL/uL — ABNORMAL LOW (ref 4.22–5.81)
RDW: 12.1 % (ref 11.5–15.5)
RDW: 12.2 % (ref 11.5–15.5)
WBC: 10 10*3/uL (ref 4.0–10.5)
WBC: 7.7 10*3/uL (ref 4.0–10.5)

## 2017-02-24 LAB — CK: CK TOTAL: 12577 U/L — AB (ref 49–397)

## 2017-02-24 MED ORDER — HEPARIN SODIUM (PORCINE) 1000 UNIT/ML DIALYSIS: 1000.0000 [IU] | INTRAMUSCULAR | Status: DC | PRN

## 2017-02-24 MED ORDER — SODIUM CHLORIDE 0.9 % IV SOLN: 100.0000 mL | INTRAVENOUS | Status: DC | PRN

## 2017-02-24 MED ORDER — PENTAFLUOROPROP-TETRAFLUOROETH EX AERO: 1.0000 "application " | INHALATION_SPRAY | CUTANEOUS | Status: DC | PRN

## 2017-02-24 MED ORDER — LIDOCAINE HCL (PF) 1 % IJ SOLN: 5.0000 mL | INTRAMUSCULAR | Status: DC | PRN

## 2017-02-24 MED ORDER — LIDOCAINE-PRILOCAINE 2.5-2.5 % EX CREA: 1.0000 "application " | TOPICAL_CREAM | CUTANEOUS | Status: DC | PRN

## 2017-02-24 MED ORDER — ALTEPLASE 2 MG IJ SOLR: 2.0000 mg | Freq: Once | INTRAMUSCULAR | Status: DC | PRN

## 2017-02-24 NOTE — Progress Notes (Signed)
PROGRESS NOTE    Dylan [name]  DZH:[id number] DOB: November 17, 1965 DOA: 02/19/2017 PCP: Carlyle Basques, MD    Brief Narrative: The patient is a 52 year old male with history of HIV, schizophrenia who presented with a 3-week history of suicidal and homicidal ideation.  He took a bunch of pills for friend 2 days prior to admission and had been doing cocaine.  He felt like he wanted to kill himself and other people.  He came to the emergency department because his urine was very dark and he had not been making much urine.  He was found to have rhabdomyolysis with acute renal failure.  He was started on IV fluids but his creatinine continued to rise and he was essentially anuric.  He is being transferred to [facility] for hemodialysis.    Assessment & Plan:   Principal Problem:   Schizoaffective disorder, depressive type ([place]) Active Problems:   HIV disease ([place])   Cocaine abuse ([place])   AKI (acute kidney injury) ([place])   Rhabdomyolysis   Suicidal overdose ([place])   Acute hepatitis   Schizoaffective disorder, bipolar type ([place])   Acute renal failure (ARF) ([place])   Suicidal ideations   Chronic 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 coma ([place])  1-AKI; Secondary to Rhabdomyolysis. Secondary to cocaine.  -CK above 50,000. ---25,000 -Nephrology consulted. IV fluids discontinue. Patient to received one dose of lasix.  -Nurse will contact Nephrology regarding need for foley  catheter.  -Renal US; negative for hydronephrosis.  -had HD 2-15, plan for HD today   Feet pain. ; he is complaining of feet pain. No sign or symptoms of compartment syndrome. No LE pain on range of motion. He denies calf, leg pain. He has feet pain. Pulse presents,.   Suicidal overdose;  Sitter at bedside.  Continue to have suicidal thought. Patient has been IVC.  Psych consulted. Recommend inpatient psychiatric admission.  Started on zyprexa. monitor QT on EKG. QT 480, repeat EKG today    Transaminases; could be related to muscle injury.   hepatitis panel ordered.  Trending down.   HIV; Appreciate Dr [name] help.  Now on Prezcobix, Epivir, Retrovir.  He will need Emergency ADAP program  HIV medications at discharge to be determine, depending on need for HD.   Schizoaffective disorder, depressive type ([place]) -  started on Zyprexa  Abdominal pain, nausea, vomiting.  KUB negative,. Had BM.  Suspect uremic symptoms.  Improved.   Hepatitis B;  Cocaine use.    DVT prophylaxis: heparin Code Status: Full code.  Family Communication: no family at bedside.  Disposition Plan: remain inpatient. Patient with suicidal thought, renal failure, IVC paper was not completed yesterday. Will start process. Will also consult Psych.    Consultants:   Dr [employer].    Procedures:   none   Antimicrobials:  none   Subjective: Report improvement of abdominal pain. He report feet pain, no calf or thigh pain.   Objective: Vitals:   02/24/17 0947 02/24/17 1300 02/24/17 1319 02/24/17 1324  BP: 134/80 132/69 131/76 135/76  Pulse: 69 69 68 72  Resp: _0 Temp: 98.6 F (37 C) 98.2 F (36.8 C)    TempSrc: Oral Oral    SpO2: 99% 96%    Weight:  109.8 kg (242 lb 1 oz)    Height:        Intake/Output Summary (Last 24 hours) at 02/24/2017 1414 Last data filed at 02/24/2017 0900 Gross per 24 hour  Intake 480 ml  Output -234 ml  Net 714 ml   Filed Weights   02/22/17 2040 02/24/17 0000 02/24/17 1300  Weight: 110.1 kg (242 lb 11.6 oz) 110.3 kg (243 lb 2.7 oz) 109.8 kg (242 lb 1 oz)    Examination:  General exam: NAD Respiratory system: CTA Cardiovascular system: S 1, S 2 RRR Gastrointestinal system: BS present, soft, nt Central nervous system: alert, non focal.  Extremities: symmetric power.      Data Reviewed: I have personally reviewed following labs and imaging studies  CBC: Recent Labs  Lab 02/19/17 1742 02/20/17 0425 02/24/17 0031  02/24/17 1231  WBC 18.4* 12.6* 10.0 7.7  NEUTROABS 14.8*  --   --   --   HGB 15.6 13.1 11.4* 11.6*  HCT 46.8 37.1* 32.5* 33.3*  MCV 89.7 88.3 85.3 85.8  PLT 208 142* 147* 826*   Basic Metabolic Panel: Recent Labs  Lab 02/20/17 0425 02/20/17 2047 02/21/17 0931 02/22/17 0536 02/23/17 0626 02/24/17 0031 02/24/17 0549  NA 134* 132* 132* 132* 131* 130* 131*  133*  K 3.8 3.4* 4.2 3.9 4.0 4.4 4.1  4.2  CL 95* 85* 85* 85* 85* 84* 87*  89*  CO2 _0 21*  GLUCOSE 120* 110* 124* 96 119* 102* 97  95  BUN 53* 117* 131* 150* 171* 193* 168*  167*  CREATININE 5.29* 7.06* 8.43* 10.90* 13.40* 14.68* 13.56*  13.45*  CALCIUM 6.8* 6.6* 6.7* 6.9* 7.0* 6.9* 7.1*  7.2*  MG 2.8*  --   --   --   --   --   --   PHOS  --  6.1* 6.2*  --  7.3* 8.6* 7.6*  7.7*   GFR: Estimated Creatinine Clearance: 8.5 mL/min (A) (by C-G formula based on SCr of 13.56 mg/dL (H)). Liver Function Tests: Recent Labs  Lab 02/19/17 1742 02/20/17 0425  02/21/17 0931 02/22/17 0536 02/23/17 0626 02/24/17 0031 02/24/17 0100 02/24/17 0549  AST 2,317* 1,449*  --  744* 451*  --   --   --   --   ALT 479* 359*  --  291* 236*  --   --  173*  --   ALKPHOS 100 68  --  66 60  --   --   --   --   BILITOT 1.1 0.6  --  0.9 0.7  --   --   --   --   PROT 8.4* 6.1*  --  5.8* 5.6*  --   --   --   --   ALBUMIN 4.5 3.2*   < > 3.0*  2.9* 2.7* 2.8* 2.6*  --  2.6*  2.6*   < > = values in this interval not displayed.   Recent Labs  Lab 02/19/17 1847  LIPASE 64*   Recent Labs  Lab 02/19/17 2025  AMMONIA 43*   Coagulation Profile: Recent Labs  Lab 02/20/17 0425  INR 1.02   Cardiac Enzymes: Recent Labs  Lab 02/20/17 0425 02/21/17 0931 02/22/17 0536 02/23/17 0626 02/24/17 0549  CKTOTAL >50,000* >50,000* >50,000* 25,509* 12,577*   BNP (last 3 results) No results for input(s): PROBNP in the last 8760 hours. HbA1C: No results for input(s): HGBA1C in the last 72 hours. CBG: Recent Labs  Lab  02/20/17 0730 02/20/17 1208  GLUCAP 107* 80   Lipid Profile: No results for input(s): CHOL, HDL, LDLCALC, TRIG, CHOLHDL, LDLDIRECT in the last 72 hours. Thyroid Function Tests: No results for  input(s): TSH, T4TOTAL, FREET4, T3FREE, THYROIDAB in the last 72 hours. Anemia Panel: No results for input(s): VITAMINB12, FOLATE, FERRITIN, TIBC, IRON, RETICCTPCT in the last 72 hours. Sepsis Labs: No results for input(s): PROCALCITON, LATICACIDVEN in the last 168 hours.  No results found for this or any previous visit (from the past 240 hour(s)).       Radiology Studies: Dg Abd 1 View  Result Date: 02/23/2017 CLINICAL DATA:  Increased vomiting EXAM: ABDOMEN - 1 VIEW COMPARISON:  None. FINDINGS: Scattered large and small bowel gas is noted. No free air is noted. No obstructive changes are seen. No bony abnormality is noted. IMPRESSION: No acute abnormality seen. Electronically Signed   By: Inez Catalina M.D.   On: 02/23/2017 08:36   Ir Fluoro Guide Cv Line Right  Result Date: 02/22/2017 INDICATION: 52 year old male with a history of renal disease EXAM: IMAGE GUIDED TEMPORARY HEMODIALYSIS CATHETER PLACEMENT MEDICATIONS: None ANESTHESIA/SEDATION: None FLUOROSCOPY TIME:  Fluoroscopy Time: 0 minutes 6 seconds (3 mGy). COMPLICATIONS: None PROCEDURE: Informed written consent was obtained from the patient after a discussion of the risks, benefits, and alternatives to treatment. Questions regarding the procedure were encouraged and answered. The right neck was prepped with chlorhexidine in a sterile fashion, and a sterile drape was applied covering the operative field. Maximum barrier sterile technique with sterile gowns and gloves were used for the procedure. A timeout was performed prior to the initiation of the procedure. A micropuncture kit was utilized to access the right internal jugular vein under direct, real-time ultrasound guidance after the overlying soft tissues were anesthetized with 1% lidocaine  with epinephrine. Ultrasound image documentation was performed. The microwire was kinked to measure appropriate catheter length. A stiff glidewire was advanced to the level of the IVC. A 20 cm hemodialysis catheter was then placed over the wire. Final catheter positioning was confirmed and documented with a spot radiographic image. The catheter aspirates and flushes normally. The catheter was flushed with appropriate volume heparin dwells. Dressings were applied. The patient tolerated the procedure well without immediate post procedural complication. IMPRESSION: Status post placement of right IJ temporary hemodialysis catheter. Catheter may be converted if need be. Signed, Dulcy Fanny. Earleen Newport, DO [facility] Radiology Electronically Signed   By: Corrie Mckusick D.O.   On: 02/22/2017 17:14   Ir US Guide Vasc Access Right  Result Date: 02/22/2017 INDICATION: 52 year old male with a history of renal disease EXAM: IMAGE GUIDED TEMPORARY HEMODIALYSIS CATHETER PLACEMENT MEDICATIONS: None ANESTHESIA/SEDATION: None FLUOROSCOPY TIME:  Fluoroscopy Time: 0 minutes 6 seconds (3 mGy). COMPLICATIONS: None PROCEDURE: Informed written consent was obtained from the patient after a discussion of the risks, benefits, and alternatives to treatment. Questions regarding the procedure were encouraged and answered. The right neck was prepped with chlorhexidine in a sterile fashion, and a sterile drape was applied covering the operative field. Maximum barrier sterile technique with sterile gowns and gloves were used for the procedure. A timeout was performed prior to the initiation of the procedure. A micropuncture kit was utilized to access the right internal jugular vein under direct, real-time ultrasound guidance after the overlying soft tissues were anesthetized with 1% lidocaine with epinephrine. Ultrasound image documentation was performed. The microwire was kinked to measure appropriate  catheter length. A stiff glidewire was advanced to the level of the IVC. A 20 cm hemodialysis catheter was then placed over the wire. Final catheter positioning was confirmed and documented with a spot radiographic image. The catheter aspirates and flushes normally.  The catheter was flushed with appropriate volume heparin dwells. Dressings were applied. The patient tolerated the procedure well without immediate post procedural complication. IMPRESSION: Status post placement of right IJ temporary hemodialysis catheter. Catheter may be converted if need be. Signed, Dulcy Fanny. Earleen Newport, DO [facility] Radiology Electronically Signed   By: Corrie Mckusick D.O.   On: 02/22/2017 17:14        Scheduled Meds: . darunavir-cobicistat  1 tablet Oral Q breakfast  . heparin injection (subcutaneous)  5,000 Units Subcutaneous Q8H  . lamiVUDine  50 mg Oral Daily  . OLANZapine  5 mg Oral QHS  . zidovudine  300 mg Oral Daily   Continuous Infusions: . sodium chloride    . sodium chloride       LOS: 5 days    Time spent: 35 minutes    Elmarie Shiley, MD [facility] Pager [phone]  If 7PM-7AM, please contact night-coverage [email or web address] Password [facility] 02/24/2017, 2:14 PM

## 2017-02-24 NOTE — Progress Notes (Signed)
[facility] ROUNDING NOTE   Subjective:     52 year old man with schizophrenia and HIV was admitted with acute kidney injury and elevated CPK > 50000 Baseline creatinine was 1 and increased to  Greater than 7      He has a foley and very minimal urine output   He has positive history for cocaine use    Also acute liver injury with AST and ALT elevation Continues on suicidal precautions   I have stopped IV fluids as it appears he has had sufficient volume and would not continue IV bicarbonate for fear of causing a metabolic alkalosis Renal function continues to decline and dialysis  Will be needed    We shall proceed with dialysis x2  Clotted HD cath will need declot      Objective:  Vital signs in last 24 hours:  Temp:  [97.7 F (36.5 C)-98.6 F (37 C)] 98.6 F (37 C) (02/16 0947) Pulse Rate:  [62-74] 69 (02/16 0947) Resp:  [13-18] 18 (02/16 0947) BP: (134-149)/(68-87) 134/80 (02/16 0947) SpO2:  [95 %-99 %] 99 % (02/16 0947) Weight:  [243 lb 2.7 oz (110.3 kg)] 243 lb 2.7 oz (110.3 kg) (02/16 0000)  Weight change: 7.1 oz (0.2 kg) Filed Weights   02/20/17 2119 02/22/17 2040 02/24/17 0000  Weight: 223 lb 12.3 oz (101.5 kg) 242 lb 11.6 oz (110.1 kg) 243 lb 2.7 oz (110.3 kg)    Intake/Output: I/O last 3 completed shifts: In: 8841 [P.O.:840; Other:360; IV Piggyback:62] Out: 85 [Urine:275]   Intake/Output this shift:  Total I/O In: 240 [P.O.:240] Out: -   CVS- RRR RS- CTA ABD- BS present soft non-distended EXT- no edema   Basic Metabolic Panel: Recent Labs  Lab 02/20/17 0425 02/20/17 2047 02/21/17 0931 02/22/17 0536 02/23/17 0626 02/24/17 0031 02/24/17 0549  NA 134* 132* 132* 132* 131* 130* 131*  133*  K 3.8 3.4* 4.2 3.9 4.0 4.4 4.1  4.2  CL 95* 85* 85* 85* 85* 84* 87*  89*  CO2 '22 30 28 26 22 23 22  '$ 21*  GLUCOSE 120* 110* 124* 96 119* 102* 97  95  BUN 53* 117* 131* 150* 171* 193* 168*  167*  CREATININE 5.29* 7.06* 8.43* 10.90*  13.40* 14.68* 13.56*  13.45*  CALCIUM 6.8* 6.6* 6.7* 6.9* 7.0* 6.9* 7.1*  7.2*  MG 2.8*  --   --   --   --   --   --   PHOS  --  6.1* 6.2*  --  7.3* 8.6* 7.6*  7.7*    Liver Function Tests: Recent Labs  Lab 02/19/17 1742 02/20/17 0425  02/21/17 0931 02/22/17 0536 02/23/17 0626 02/24/17 0031 02/24/17 0100 02/24/17 0549  AST 2,317* 1,449*  --  744* 451*  --   --   --   --   ALT 479* 359*  --  291* 236*  --   --  173*  --   ALKPHOS 100 68  --  66 60  --   --   --   --   BILITOT 1.1 0.6  --  0.9 0.7  --   --   --   --   PROT 8.4* 6.1*  --  5.8* 5.6*  --   --   --   --   ALBUMIN 4.5 3.2*   < > 3.0*  2.9* 2.7* 2.8* 2.6*  --  2.6*  2.6*   < > = values in this interval not displayed.  Recent Labs  Lab 02/19/17 1847  LIPASE 64*   Recent Labs  Lab 02/19/17 2025  AMMONIA 43*    CBC: Recent Labs  Lab 02/19/17 1742 02/20/17 0425 02/24/17 0031  WBC 18.4* 12.6* 10.0  NEUTROABS 14.8*  --   --   HGB 15.6 13.1 11.4*  HCT 46.8 37.1* 32.5*  MCV 89.7 88.3 85.3  PLT 208 142* 147*    Cardiac Enzymes: Recent Labs  Lab 02/20/17 0425 02/21/17 0931 02/22/17 0536 02/23/17 0626 02/24/17 0549  CKTOTAL >50,000* >50,000* >50,000* 25,509* 12,577*    BNP: Invalid input(s): POCBNP  CBG: Recent Labs  Lab 02/20/17 0730 02/20/17 1208  GLUCAP 107* 80    Microbiology: No results found for this or any previous visit.  Coagulation Studies: No results for input(s): LABPROT, INR in the last 72 hours.  Urinalysis: No results for input(s): COLORURINE, LABSPEC, PHURINE, GLUCOSEU, HGBUR, BILIRUBINUR, KETONESUR, PROTEINUR, UROBILINOGEN, NITRITE, LEUKOCYTESUR in the last 72 hours.  Invalid input(s): APPERANCEUR    Imaging: Dg Abd 1 View  Result Date: 02/23/2017 CLINICAL DATA:  Increased vomiting EXAM: ABDOMEN - 1 VIEW COMPARISON:  None. FINDINGS: Scattered large and small bowel gas is noted. No free air is noted. No obstructive changes are seen. No bony abnormality is  noted. IMPRESSION: No acute abnormality seen. Electronically Signed   By: Inez Catalina M.D.   On: 02/23/2017 08:36   Ir Fluoro Guide Cv Line Right  Result Date: 02/22/2017 INDICATION: 52 year old male with a history of renal disease EXAM: IMAGE GUIDED TEMPORARY HEMODIALYSIS CATHETER PLACEMENT MEDICATIONS: None ANESTHESIA/SEDATION: None FLUOROSCOPY TIME:  Fluoroscopy Time: 0 minutes 6 seconds (3 mGy). COMPLICATIONS: None PROCEDURE: Informed written consent was obtained from the patient after a discussion of the risks, benefits, and alternatives to treatment. Questions regarding the procedure were encouraged and answered. The right neck was prepped with chlorhexidine in a sterile fashion, and a sterile drape was applied covering the operative field. Maximum barrier sterile technique with sterile gowns and gloves were used for the procedure. A timeout was performed prior to the initiation of the procedure. A micropuncture kit was utilized to access the right internal jugular vein under direct, real-time ultrasound guidance after the overlying soft tissues were anesthetized with 1% lidocaine with epinephrine. Ultrasound image documentation was performed. The microwire was kinked to measure appropriate catheter length. A stiff glidewire was advanced to the level of the IVC. A 20 cm hemodialysis catheter was then placed over the wire. Final catheter positioning was confirmed and documented with a spot radiographic image. The catheter aspirates and flushes normally. The catheter was flushed with appropriate volume heparin dwells. Dressings were applied. The patient tolerated the procedure well without immediate post procedural complication. IMPRESSION: Status post placement of right IJ temporary hemodialysis catheter. Catheter may be converted if need be. Signed, Dulcy Fanny. Earleen Newport, DO [facility] Electronically Signed   By: Corrie Mckusick D.O.   On: 02/22/2017  17:14   Ir US Guide Vasc Access Right  Result Date: 02/22/2017 INDICATION: 52 year old male with a history of renal disease EXAM: IMAGE GUIDED TEMPORARY HEMODIALYSIS CATHETER PLACEMENT MEDICATIONS: None ANESTHESIA/SEDATION: None FLUOROSCOPY TIME:  Fluoroscopy Time: 0 minutes 6 seconds (3 mGy). COMPLICATIONS: None PROCEDURE: Informed written consent was obtained from the patient after a discussion of the risks, benefits, and alternatives to treatment. Questions regarding the procedure were encouraged and answered. The right neck was prepped with chlorhexidine in a sterile fashion, and a sterile drape was applied covering the operative field.  Maximum barrier sterile technique with sterile gowns and gloves were used for the procedure. A timeout was performed prior to the initiation of the procedure. A micropuncture kit was utilized to access the right internal jugular vein under direct, real-time ultrasound guidance after the overlying soft tissues were anesthetized with 1% lidocaine with epinephrine. Ultrasound image documentation was performed. The microwire was kinked to measure appropriate catheter length. A stiff glidewire was advanced to the level of the IVC. A 20 cm hemodialysis catheter was then placed over the wire. Final catheter positioning was confirmed and documented with a spot radiographic image. The catheter aspirates and flushes normally. The catheter was flushed with appropriate volume heparin dwells. Dressings were applied. The patient tolerated the procedure well without immediate post procedural complication. IMPRESSION: Status post placement of right IJ temporary hemodialysis catheter. Catheter may be converted if need be. Signed, Dulcy Fanny. Earleen Newport, DO [facility] Electronically Signed   By: Corrie Mckusick D.O.   On: 02/22/2017 17:14     Medications:   . sodium chloride    . sodium chloride     . darunavir-cobicistat  1 tablet  Oral Q breakfast  . heparin injection (subcutaneous)  5,000 Units Subcutaneous Q8H  . lamiVUDine  50 mg Oral Daily  . OLANZapine  5 mg Oral QHS  . zidovudine  300 mg Oral Daily   sodium chloride, sodium chloride, albuterol, alteplase, heparin, lidocaine (PF), lidocaine, lidocaine-prilocaine, ondansetron (ZOFRAN) IV, pentafluoroprop-tetrafluoroeth, simethicone, traMADol  Assessment/ Plan:   Acute kidney injury appears to have a CPK that is elevated tandem with increase in creatinine I suspect that this represents rhabdomyolysis  Urine showed dipstick positive blood with no rbc. No hydronephrosis. Minimal urine output although no signs of fluid overload at this time.   Started dialysis  Will start dialysis appreciate IR help  ID  Appreciate Dr [name] input  That has been very helpful     LOS: 5 Ezekiel Menzer W '@TODAY''@11'$ :03 AM

## 2017-02-24 NOTE — Progress Notes (Signed)
Asked to evaluate patient for clotted temp cath.  Both ports were flushed and flushed with no difficulties.  Both pulled back as well with good blood return.  No exchange needed.  Ready for use.  Henreitta Cea 9:54 AM 02/24/2017

## 2017-02-25 LAB — RENAL FUNCTION PANEL
ANION GAP: 18 — AB (ref 5–15)
Albumin: 2.6 g/dL — ABNORMAL LOW (ref 3.5–5.0)
BUN: 141 mg/dL — ABNORMAL HIGH (ref 6–20)
CALCIUM: 7.4 mg/dL — AB (ref 8.9–10.3)
CHLORIDE: 91 mmol/L — AB (ref 101–111)
CO2: 24 mmol/L (ref 22–32)
Creatinine, Ser: 13.18 mg/dL — ABNORMAL HIGH (ref 0.61–1.24)
GFR calc non Af Amer: 4 mL/min — ABNORMAL LOW (ref >60)
GFR, EST AFRICAN AMERICAN: 4 mL/min — AB (ref >60)
GLUCOSE: 117 mg/dL — AB (ref 65–99)
POTASSIUM: 4.2 mmol/L (ref 3.5–5.1)
Phosphorus: 7.9 mg/dL — ABNORMAL HIGH (ref 2.5–4.6)
Sodium: 133 mmol/L — ABNORMAL LOW (ref 135–145)

## 2017-02-25 LAB — HEPATIC FUNCTION PANEL
ALBUMIN: 2.6 g/dL — AB (ref 3.5–5.0)
ALK PHOS: 61 U/L (ref 38–126)
ALT: 113 U/L — AB (ref 17–63)
AST: 62 U/L — AB (ref 15–41)
BILIRUBIN TOTAL: 0.8 mg/dL (ref 0.3–1.2)
Bilirubin, Direct: <0.1 mg/dL — ABNORMAL LOW (ref 0.1–0.5)
TOTAL PROTEIN: 5.8 g/dL — AB (ref 6.5–8.1)

## 2017-02-25 LAB — CK: Total CK: 18548 U/L — ABNORMAL HIGH (ref 49–397)

## 2017-02-25 NOTE — Progress Notes (Signed)
PROGRESS NOTE    Dylan [name]  PJA:[id number] DOB: [birth year] DOA: 02/19/2017 PCP: Carlyle Basques, MD    Brief Narrative: The patient is a 52 year old male with history of HIV, schizophrenia who presented with a 3-week history of suicidal and homicidal ideation.  He took a bunch of pills for friend 2 days prior to admission and had been doing cocaine.  He felt like he wanted to kill himself and other people.  He came to the emergency department because his urine was very dark and he had not been making much urine.  He was found to have rhabdomyolysis with acute renal failure.  He was started on IV fluids but his creatinine continued to rise and he was essentially anuric.  He is being transferred to [facility] for hemodialysis.    Assessment & Plan:   Principal Problem:   Schizoaffective disorder, depressive type ([employer]) Active Problems:   HIV disease ([place])   Cocaine abuse ([place])   AKI (acute kidney injury) ([place])   Rhabdomyolysis   Suicidal overdose ([place])   Acute hepatitis   Schizoaffective disorder, bipolar type ([place])   Acute renal failure (ARF) ([place])   Suicidal ideations   Chronic 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 coma ([place])  1-AKI; Secondary to Rhabdomyolysis. Secondary to cocaine.  -CK above 50,000. ---25,000--12,000---18,000 -Nephrology consulted. IV fluids discontinue. Patient to received  IV lasix.  -Renal US; negative for hydronephrosis.  -Had HD 2-15, 2-16. -Plan for HD tomorrow.   Feet pain. ; he is complaining of feet pain. No sign or symptoms of compartment syndrome. No LE pain on range of motion. He denies calf, leg pain. He has feet pain. Pulse presents,.   Suicidal overdose;  Sitter at bedside.  Patient has been IVC.  Psych consulted. Recommend inpatient psychiatric admission.  Started on zyprexa. monitor QT on EKG. QT 480, EKG 2-16; QT 470.  Monitor EKG.   Transaminases; could be related to muscle injury.   hepatitis panel ordered.    Trending down.   HIV; Appreciate Dr [name] help.  Now on Prezcobix, Epivir, Retrovir.  He will need Emergency ADAP program  HIV medications at discharge to be determine, depending on need for HD.   Schizoaffective disorder, depressive type ([place]) -  started on Zyprexa  Abdominal pain, nausea, vomiting.  KUB negative,. Had BM.  Suspect uremic symptoms.  Denies abdominal pain today /  Hepatitis B;  Cocaine use.    DVT prophylaxis: heparin Code Status: Full code.  Family Communication: no family at bedside.  Disposition Plan: IVC, needs inpatient psych facility admission, getting HD>    Consultants:   Dr [employer].    Procedures:   none   Antimicrobials:  none   Subjective: He is feeling the same as yesterday.  He denies abdominal pain.  Still with feet pain.   Objective: Vitals:   02/24/17 2131 02/25/17 0500 02/25/17 0536 02/25/17 0929  BP: (!) 142/77  139/65 136/75  Pulse: 80  75 74  Resp: (!) 24  (!) 21 18  Temp: 98.8 F (37.1 C)  98.2 F (36.8 C) 98.3 F (36.8 C)  TempSrc: Oral  Oral Oral  SpO2: 95%  96% 99%  Weight:  109.8 kg (242 lb 1 oz)    Height:        Intake/Output Summary (Last 24 hours) at 02/25/2017 1607 Last data filed at 02/25/2017 1300 Gross per 24 hour  Intake 600 ml  Output 275 ml  Net 325 ml  Filed Weights   02/24/17 1300 02/24/17 1549 02/25/17 0500  Weight: 109.8 kg (242 lb 1 oz) 109.8 kg (242 lb 1 oz) 109.8 kg (242 lb 1 oz)    Examination:  General exam: NAD Respiratory system: CTA Cardiovascular system:  S 1, S 2 RRR Gastrointestinal system: BS present, soft, nt Central nervous system: non focal.  Extremities: symmetric power.     Data Reviewed: I have personally reviewed following labs and imaging studies  CBC: Recent Labs  Lab 02/19/17 1742 02/20/17 0425 02/24/17 0031 02/24/17 1231  WBC 18.4* 12.6* 10.0 7.7  NEUTROABS 14.8*  --   --   --   HGB 15.6 13.1 11.4* 11.6*  HCT 46.8 37.1* 32.5* 33.3*  MCV 89.7  88.3 85.3 85.8  PLT 208 142* 147* 078*   Basic Metabolic Panel: Recent Labs  Lab 02/20/17 0425  02/21/17 0931 02/22/17 0536 02/23/17 0626 02/24/17 0031 02/24/17 0549 02/25/17 1005  NA 134*   < > 132* 132* 131* 130* 131*  133* 133*  K 3.8   < > 4.2 3.9 4.0 4.4 4.1  4.2 4.2  CL 95*   < > 85* 85* 85* 84* 87*  89* 91*  CO2 22   < > 28 26 22 23 22   21* 24  GLUCOSE 120*   < > 124* 96 119* 102* 97  95 117*  BUN 53*   < > 131* 150* 171* 193* 168*  167* 141*  CREATININE 5.29*   < > 8.43* 10.90* 13.40* 14.68* 13.56*  13.45* 13.18*  CALCIUM 6.8*   < > 6.7* 6.9* 7.0* 6.9* 7.1*  7.2* 7.4*  MG 2.8*  --   --   --   --   --   --   --   PHOS  --    < > 6.2*  --  7.3* 8.6* 7.6*  7.7* 7.9*   < > = values in this interval not displayed.   GFR: Estimated Creatinine Clearance: 8.7 mL/min (A) (by C-G formula based on SCr of 13.18 mg/dL (H)). Liver Function Tests: Recent Labs  Lab 02/19/17 1742 02/20/17 0425  02/21/17 0931 02/22/17 0536 02/23/17 0626 02/24/17 0031 02/24/17 0100 02/24/17 0549 02/25/17 1005  AST 2,317* 1,449*  --  744* 451*  --   --   --   --  62*  ALT 479* 359*  --  291* 236*  --   --  173*  --  113*  ALKPHOS 100 68  --  66 60  --   --   --   --  61  BILITOT 1.1 0.6  --  0.9 0.7  --   --   --   --  0.8  PROT 8.4* 6.1*  --  5.8* 5.6*  --   --   --   --  5.8*  ALBUMIN 4.5 3.2*   < > 3.0*  2.9* 2.7* 2.8* 2.6*  --  2.6*  2.6* 2.6*  2.6*   < > = values in this interval not displayed.   Recent Labs  Lab 02/19/17 1847  LIPASE 64*   Recent Labs  Lab 02/19/17 2025  AMMONIA 43*   Coagulation Profile: Recent Labs  Lab 02/20/17 0425  INR 1.02   Cardiac Enzymes: Recent Labs  Lab 02/21/17 0931 02/22/17 0536 02/23/17 0626 02/24/17 0549 02/25/17 1005  CKTOTAL >50,000* >50,000* 25,509* 12,577* 18,548*   BNP (last 3 results) No results for input(s): PROBNP in the last 8760 hours. HbA1C: No results  for input(s): HGBA1C in the last 72 hours. CBG: Recent  Labs  Lab 02/20/17 0730 02/20/17 1208  GLUCAP 107* 80   Lipid Profile: No results for input(s): CHOL, HDL, LDLCALC, TRIG, CHOLHDL, LDLDIRECT in the last 72 hours. Thyroid Function Tests: No results for input(s): TSH, T4TOTAL, FREET4, T3FREE, THYROIDAB in the last 72 hours. Anemia Panel: No results for input(s): VITAMINB12, FOLATE, FERRITIN, TIBC, IRON, RETICCTPCT in the last 72 hours. Sepsis Labs: No results for input(s): PROCALCITON, LATICACIDVEN in the last 168 hours.  No results found for this or any previous visit (from the past 240 hour(s)).       Radiology Studies: No results found.      Scheduled Meds: . darunavir-cobicistat  1 tablet Oral Q breakfast  . heparin injection (subcutaneous)  5,000 Units Subcutaneous Q8H  . lamiVUDine  50 mg Oral Daily  . OLANZapine  5 mg Oral QHS  . zidovudine  300 mg Oral Daily   Continuous Infusions:    LOS: 6 days    Time spent: 35 minutes    Elmarie Shiley, MD [facility] Pager [phone]  If 7PM-7AM, please contact night-coverage [email or web address] Password [facility] 02/25/2017, 4:07 PM

## 2017-02-25 NOTE — Progress Notes (Signed)
[facility] ROUNDING NOTE   Subjective:     52 year old man with schizophrenia and HIV was admitted with acute kidney injury and elevated CPK > 50000 Baseline creatinine was 1 and increased to  Greater than 7 :   He does not  have a foley and very minimal urine output    He has positive history for cocaine use  most likely precipitant of Rhabdomyolysis no evidence of compartment syndrome although some cases of antiretrovirals causing rhabdomyolisis Also acute liver injury with AST and ALT elevation Continues on suicidal precautions   I have stopped IV fluids as it appears he has had sufficient volume and would not continue IV bicarbonate for fear of causing a metabolic alkalosis Renal function continues to decline and dialysis x 2 administered    We shall proceed with dialysis in AM # 3       Objective:  Vital signs in last 24 hours:  Temp:  [97.7 F (36.5 C)-98.8 F (37.1 C)] 98.3 F (36.8 C) (02/17 0929) Pulse Rate:  [68-80] 74 (02/17 0929) Resp:  [18-24] 18 (02/17 0929) BP: (131-156)/(65-91) 136/75 (02/17 0929) SpO2:  [95 %-99 %] 99 % (02/17 0929) Weight:  [242 lb 1 oz (109.8 kg)] 242 lb 1 oz (109.8 kg) (02/17 0500)  Weight change: -1.6 oz (-0.5 kg) Filed Weights   02/24/17 1300 02/24/17 1549 02/25/17 0500  Weight: 242 lb 1 oz (109.8 kg) 242 lb 1 oz (109.8 kg) 242 lb 1 oz (109.8 kg)    Intake/Output: I/O last 3 completed shifts: In: 240 [P.O.:240] Out: -20 [Urine:175]   Intake/Output this shift:  Total I/O In: 360 [P.O.:360] Out: 0   CVS- RRR RS- CTA ABD- BS present soft non-distended EXT- no edema   Basic Metabolic Panel: Recent Labs  Lab 02/20/17 0425 02/20/17 2047 02/21/17 0931 02/22/17 0536 02/23/17 0626 02/24/17 0031 02/24/17 0549  NA 134* 132* 132* 132* 131* 130* 131*  133*  K 3.8 3.4* 4.2 3.9 4.0 4.4 4.1  4.2  CL 95* 85* 85* 85* 85* 84* 87*  89*  CO2 22 30 28 26 22 23 22   21*  GLUCOSE 120* 110* 124* 96 119* 102* 97   95  BUN 53* 117* 131* 150* 171* 193* 168*  167*  CREATININE 5.29* 7.06* 8.43* 10.90* 13.40* 14.68* 13.56*  13.45*  CALCIUM 6.8* 6.6* 6.7* 6.9* 7.0* 6.9* 7.1*  7.2*  MG 2.8*  --   --   --   --   --   --   PHOS  --  6.1* 6.2*  --  7.3* 8.6* 7.6*  7.7*    Liver Function Tests: Recent Labs  Lab 02/19/17 1742 02/20/17 0425  02/21/17 0931 02/22/17 0536 02/23/17 0626 02/24/17 0031 02/24/17 0100 02/24/17 0549  AST 2,317* 1,449*  --  744* 451*  --   --   --   --   ALT 479* 359*  --  291* 236*  --   --  173*  --   ALKPHOS 100 68  --  66 60  --   --   --   --   BILITOT 1.1 0.6  --  0.9 0.7  --   --   --   --   PROT 8.4* 6.1*  --  5.8* 5.6*  --   --   --   --   ALBUMIN 4.5 3.2*   < > 3.0*  2.9* 2.7* 2.8* 2.6*  --  2.6*  2.6*   < > =  values in this interval not displayed.   Recent Labs  Lab 02/19/17 1847  LIPASE 64*   Recent Labs  Lab 02/19/17 2025  AMMONIA 43*    CBC: Recent Labs  Lab 02/19/17 1742 02/20/17 0425 02/24/17 0031 02/24/17 1231  WBC 18.4* 12.6* 10.0 7.7  NEUTROABS 14.8*  --   --   --   HGB 15.6 13.1 11.4* 11.6*  HCT 46.8 37.1* 32.5* 33.3*  MCV 89.7 88.3 85.3 85.8  PLT 208 142* 147* 141*    Cardiac Enzymes: Recent Labs  Lab 02/20/17 0425 02/21/17 0931 02/22/17 0536 02/23/17 0626 02/24/17 0549  CKTOTAL >50,000* >50,000* >50,000* 25,509* 12,577*    BNP: Invalid input(s): POCBNP  CBG: Recent Labs  Lab 02/20/17 0730 02/20/17 1208  GLUCAP 107* 80    Microbiology: No results found for this or any previous visit.  Coagulation Studies: No results for input(s): LABPROT, INR in the last 72 hours.  Urinalysis: No results for input(s): COLORURINE, LABSPEC, PHURINE, GLUCOSEU, HGBUR, BILIRUBINUR, KETONESUR, PROTEINUR, UROBILINOGEN, NITRITE, LEUKOCYTESUR in the last 72 hours.  Invalid input(s): APPERANCEUR    Imaging: No results found.   Medications:    . darunavir-cobicistat  1 tablet Oral Q breakfast  . heparin injection  (subcutaneous)  5,000 Units Subcutaneous Q8H  . lamiVUDine  50 mg Oral Daily  . OLANZapine  5 mg Oral QHS  . zidovudine  300 mg Oral Daily   albuterol, lidocaine, ondansetron (ZOFRAN) IV, simethicone, traMADol  Assessment/ Plan:   Acute kidney injury appears to have a CPK that is elevated tandem with increase in creatinine I suspect that this represents rhabdomyolysis  Urine showed dipstick positive blood with no rbc. No hydronephrosis. Minimal urine output although no signs of fluid overload at this time. Etiology appears to be cocaine although consideration being given to antiretrovirals  Dialysis in AM   Dialysis 2/15 2/16  No fluid removed   Dialysis catheter placed 2/15 Temporary   ID  Appreciate Dr [name] input  That has been very helpful  Urine output poor  Will order a bladder scan  If > 300  Please cath patient and leave in foley     LOS: 6 Dylan [name] W @TODAY @10 :30 AM

## 2017-02-26 ENCOUNTER — Inpatient Hospital Stay (HOSPITAL_COMMUNITY): Payer: Self-pay

## 2017-02-26 DIAGNOSIS — F250 Schizoaffective disorder, bipolar type: Secondary | ICD-10-CM

## 2017-02-26 LAB — RENAL FUNCTION PANEL
ALBUMIN: 2.6 g/dL — AB (ref 3.5–5.0)
ANION GAP: 21 — AB (ref 5–15)
BUN: 156 mg/dL — AB (ref 6–20)
CO2: 21 mmol/L — ABNORMAL LOW (ref 22–32)
Calcium: 7.1 mg/dL — ABNORMAL LOW (ref 8.9–10.3)
Chloride: 91 mmol/L — ABNORMAL LOW (ref 101–111)
Creatinine, Ser: 14.99 mg/dL — ABNORMAL HIGH (ref 0.61–1.24)
GFR, EST AFRICAN AMERICAN: 4 mL/min — AB (ref >60)
GFR, EST NON AFRICAN AMERICAN: 3 mL/min — AB (ref >60)
Glucose, Bld: 93 mg/dL (ref 65–99)
PHOSPHORUS: 9.7 mg/dL — AB (ref 2.5–4.6)
POTASSIUM: 5.1 mmol/L (ref 3.5–5.1)
Sodium: 133 mmol/L — ABNORMAL LOW (ref 135–145)

## 2017-02-26 LAB — HEPATITIS B CORE ANTIBODY, TOTAL: HEP B C TOTAL AB: POSITIVE — AB

## 2017-02-26 LAB — HEPATIC FUNCTION PANEL
ALBUMIN: 2.6 g/dL — AB (ref 3.5–5.0)
ALT: 95 U/L — ABNORMAL HIGH (ref 17–63)
AST: 45 U/L — ABNORMAL HIGH (ref 15–41)
Alkaline Phosphatase: 69 U/L (ref 38–126)
BILIRUBIN INDIRECT: 0.5 mg/dL (ref 0.3–0.9)
Bilirubin, Direct: 0.2 mg/dL (ref 0.1–0.5)
TOTAL PROTEIN: 5.8 g/dL — AB (ref 6.5–8.1)
Total Bilirubin: 0.7 mg/dL (ref 0.3–1.2)

## 2017-02-26 LAB — HEPATITIS B SURFACE ANTIBODY, QUANTITATIVE: Hepatitis B-Post: <3.1 m[IU]/mL — ABNORMAL LOW (ref >9.9)

## 2017-02-26 LAB — CBC
HCT: 29.9 % — ABNORMAL LOW (ref 39.0–52.0)
Hemoglobin: 10.3 g/dL — ABNORMAL LOW (ref 13.0–17.0)
MCH: 29.7 pg (ref 26.0–34.0)
MCHC: 34.4 g/dL (ref 30.0–36.0)
MCV: 86.2 fL (ref 78.0–100.0)
Platelets: 150 10*3/uL (ref 150–400)
RBC: 3.47 MIL/uL — AB (ref 4.22–5.81)
RDW: 12.4 % (ref 11.5–15.5)
WBC: 7.3 10*3/uL (ref 4.0–10.5)

## 2017-02-26 LAB — CK: CK TOTAL: 3661 U/L — AB (ref 49–397)

## 2017-02-26 MED ORDER — DEXTROSE 5 % IV SOLN
500.0000 mg | Freq: Two times a day (BID) | INTRAVENOUS | Status: DC
  Administered 2017-02-26 – 2017-03-01 (×6): 500 mg via INTRAVENOUS
  Filled 2017-02-26 (×8): qty 0.5

## 2017-02-26 MED ORDER — SODIUM CHLORIDE 0.9 % IV SOLN: 2.0000 g | Freq: Three times a day (TID) | INTRAVENOUS | Status: DC

## 2017-02-26 MED ORDER — ACETAMINOPHEN 500 MG PO TABS
500.0000 mg | ORAL_TABLET | Freq: Four times a day (QID) | ORAL | Status: DC | PRN
  Filled 2017-02-26 (×2): qty 1

## 2017-02-26 MED ORDER — ACETAMINOPHEN 325 MG PO TABS
650.0000 mg | ORAL_TABLET | Freq: Once | ORAL | Status: AC
  Administered 2017-02-26: 650 mg via ORAL
  Filled 2017-02-26: qty 2

## 2017-02-26 MED ORDER — HEPARIN SODIUM (PORCINE) 1000 UNIT/ML DIALYSIS: 1000.0000 [IU] | INTRAMUSCULAR | Status: DC | PRN

## 2017-02-26 MED ORDER — LIDOCAINE-PRILOCAINE 2.5-2.5 % EX CREA: 1.0000 "application " | TOPICAL_CREAM | CUTANEOUS | Status: DC | PRN

## 2017-02-26 MED ORDER — LIDOCAINE HCL (PF) 1 % IJ SOLN: 5.0000 mL | INTRAMUSCULAR | Status: DC | PRN

## 2017-02-26 MED ORDER — VANCOMYCIN HCL 10 G IV SOLR
2000.0000 mg | Freq: Once | INTRAVENOUS | Status: AC
  Administered 2017-02-26: 2000 mg via INTRAVENOUS
  Filled 2017-02-26: qty 2000

## 2017-02-26 MED ORDER — SODIUM CHLORIDE 0.9 % IV SOLN: 100.0000 mL | INTRAVENOUS | Status: DC | PRN

## 2017-02-26 MED ORDER — ALTEPLASE 2 MG IJ SOLR: 2.0000 mg | Freq: Once | INTRAMUSCULAR | Status: DC | PRN

## 2017-02-26 MED ORDER — PENTAFLUOROPROP-TETRAFLUOROETH EX AERO: 1.0000 "application " | INHALATION_SPRAY | CUTANEOUS | Status: DC | PRN

## 2017-02-26 NOTE — Progress Notes (Signed)
Patient returned from HD with a temp. Of 101.5. MD notified. Gave verbal orders for Tylenol PO 650 mg  Once. Order placed. Will continue to monitor.

## 2017-02-26 NOTE — Progress Notes (Signed)
[facility] ROUNDING NOTE   Subjective:     52 year old man with schizophrenia and HIV was admitted with acute kidney injury and elevated CPK > 50000 Baseline creatinine was 1 and increased to  Greater than 7  He has positive history for cocaine use  most likely precipitant of Rhabdomyolysis no evidence of compartment syndrome although some cases of antiretrovirals causing rhabdomyolisis Also acute liver injury with AST and ALT elevation Continues on suicidal precautions  Renal function continues to decline and started on temp HD   Objective:  Vital signs in last 24 hours:  Temp:  [98.3 F (36.8 C)-99.6 F (37.6 C)] 99.6 F (37.6 C) (02/18 0607) Pulse Rate:  [74-80] 80 (02/18 0607) Resp:  [18] 18 (02/17 0929) BP: (136-149)/(71-78) 149/78 (02/18 0607) SpO2:  [93 %-99 %] 93 % (02/18 0607)  Weight change:  Filed Weights   02/24/17 1300 02/24/17 1549 02/25/17 0500  Weight: 109.8 kg (242 lb 1 oz) 109.8 kg (242 lb 1 oz) 109.8 kg (242 lb 1 oz)    Intake/Output: I/O last 3 completed shifts: In: 960 [P.O.:960] Out: 100 [Urine:100]   Intake/Output this shift:  No intake/output data recorded.  Alert, withdrawn CVS- RRR RS- CTA ABD- BS present soft non-distended EXT- 1+ pedal edema   Basic Metabolic Panel: Recent Labs  Lab 02/20/17 0425  02/23/17 0626 02/24/17 0031 02/24/17 0549 02/25/17 1005 02/26/17 0550  NA 134*   < > 131* 130* 131*  133* 133* 133*  K 3.8   < > 4.0 4.4 4.1  4.2 4.2 5.1  CL 95*   < > 85* 84* 87*  89* 91* 91*  CO2 22   < > 22 23 22   21* 24 21*  GLUCOSE 120*   < > 119* 102* 97  95 117* 93  BUN 53*   < > 171* 193* 168*  167* 141* 156*  CREATININE 5.29*   < > 13.40* 14.68* 13.56*  13.45* 13.18* 14.99*  CALCIUM 6.8*   < > 7.0* 6.9* 7.1*  7.2* 7.4* 7.1*  MG 2.8*  --   --   --   --   --   --   PHOS  --    < > 7.3* 8.6* 7.6*  7.7* 7.9* 9.7*   < > = values in this interval not displayed.    Liver Function Tests: Recent Labs   Lab 02/19/17 1742 02/20/17 0425  02/21/17 0931 02/22/17 0536 02/23/17 0626 02/24/17 0031 02/24/17 0100 02/24/17 0549 02/25/17 1005 02/26/17 0550  AST 2,317* 1,449*  --  744* 451*  --   --   --   --  62*  --   ALT 479* 359*  --  291* 236*  --   --  173*  --  113*  --   ALKPHOS 100 68  --  66 60  --   --   --   --  61  --   BILITOT 1.1 0.6  --  0.9 0.7  --   --   --   --  0.8  --   PROT 8.4* 6.1*  --  5.8* 5.6*  --   --   --   --  5.8*  --   ALBUMIN 4.5 3.2*   < > 3.0*  2.9* 2.7* 2.8* 2.6*  --  2.6*  2.6* 2.6*  2.6* 2.6*   < > = values in this interval not displayed.   Recent Labs  Lab 02/19/17 1847  LIPASE 64*   Recent Labs  Lab 02/19/17 2025  AMMONIA 43*    CBC: Recent Labs  Lab 02/19/17 1742 02/20/17 0425 02/24/17 0031 02/24/17 1231 02/26/17 0808  WBC 18.4* 12.6* 10.0 7.7 7.3  NEUTROABS 14.8*  --   --   --   --   HGB 15.6 13.1 11.4* 11.6* 10.3*  HCT 46.8 37.1* 32.5* 33.3* 29.9*  MCV 89.7 88.3 85.3 85.8 86.2  PLT 208 142* 147* 141* 150    Cardiac Enzymes: Recent Labs  Lab 02/22/17 0536 02/23/17 0626 02/24/17 0549 02/25/17 1005 02/26/17 0550  CKTOTAL >50,000* 25,509* 12,577* 18,548* 3,661*    BNP: Invalid input(s): POCBNP  CBG: Recent Labs  Lab 02/20/17 0730 02/20/17 1208  GLUCAP 107* 80    Microbiology: No results found for this or any previous visit.  Coagulation Studies: No results for input(s): LABPROT, INR in the last 72 hours.  Urinalysis: No results for input(s): COLORURINE, LABSPEC, PHURINE, GLUCOSEU, HGBUR, BILIRUBINUR, KETONESUR, PROTEINUR, UROBILINOGEN, NITRITE, LEUKOCYTESUR in the last 72 hours.  Invalid input(s): APPERANCEUR    Imaging: No results found.   Medications:   . sodium chloride    . sodium chloride     . darunavir-cobicistat  1 tablet Oral Q breakfast  . heparin injection (subcutaneous)  5,000 Units Subcutaneous Q8H  . lamiVUDine  50 mg Oral Daily  . OLANZapine  5 mg Oral QHS  . zidovudine  300 mg  Oral Daily   sodium chloride, sodium chloride, albuterol, alteplase, heparin, lidocaine (PF), lidocaine, lidocaine-prilocaine, ondansetron (ZOFRAN) IV, pentafluoroprop-tetrafluoroeth, simethicone, traMADol  Assessment/ Plan:   Acute kidney injury - suspect that this is due to rhabdomyolysis. Urine showed dipstick positive blood with no rbc. No hydronephrosis. Minimal urine output. Etiology of rhabdo appears to be cocaine although consideration being given to antiretrovirals. Getting HD , 3rd session today. HD again tomorrow d/t sig azotemia.   ID  Appreciate Dr [name] input    Kelly Splinter MD [facility] pgr [phone]   02/26/2017, 9:13 AM

## 2017-02-26 NOTE — Progress Notes (Signed)
Pharmacy Antibiotic Note  Dylan [name] is a 52 y.o. male admitted on 02/19/2017 with pneumonia. Patient has a PMH of schizophrenia and HIV and was admitted with acute kidney injury and elevated CPK > 50,000. Baseline creatinine ~1.  Pharmacy has been consulted for vancomycin dosing. Patient is on temporary HD. Last HD today 2/18 (3rd session) and planning to go to HD 2/19.   Plan: Vancomycin loading dose of 2,000 mg x1  Follow-up on HD schedule and vancomycin plans 2/19  Follow-up on renal function, LOT, and clinical progression  Height: 6\' 2"  (188 cm) Weight: 243 lb 13.3 oz (110.6 kg) IBW/kg (Calculated) : 82.2  Temp (24hrs), Avg:99.4 F (37.4 C), Min:97.4 F (36.3 C), Max:101.5 F (38.6 C)  Recent Labs  Lab 02/19/17 1742 02/20/17 0425  02/23/17 0626 02/24/17 0031 02/24/17 0549 02/24/17 1231 02/25/17 1005 02/26/17 0550 02/26/17 0808  WBC 18.4* 12.6*  --   --  10.0  --  7.7  --   --  7.3  CREATININE 4.30* 5.29*   < > 13.40* 14.68* 13.56*  13.45*  --  13.18* 14.99*  --    < > = values in this interval not displayed.    Estimated Creatinine Clearance: 7.7 mL/min (A) (by C-G formula based on SCr of 14.99 mg/dL (H)).    Allergies  Allergen Reactions  . Penicillins Other (See Comments)    Childhood allergy  . Latex Rash  . Tape Rash    Antimicrobials this admission: Vanc 2/18 >>   Thank you for allowing pharmacy to be a part of this patient's care.   Diana L. Kyung Rudd, PharmD, [place] PGY1 Pharmacy Resident Pager: [phone]

## 2017-02-26 NOTE — Progress Notes (Signed)
PROGRESS NOTE    Dylan [name]  OBS:[id number] DOB: Dec 26, 1965 DOA: 02/19/2017 PCP: Carlyle Basques, MD    Brief Narrative: The patient is a 52 year old male with history of HIV, schizophrenia who presented with a 3-week history of suicidal and homicidal ideation.  He took a bunch of pills for friend 2 days prior to admission and had been doing cocaine.  He felt like he wanted to kill himself and other people.  He came to the emergency department because his urine was very dark and he had not been making much urine.  He was found to have rhabdomyolysis with acute renal failure.  He was started on IV fluids but his creatinine continued to rise and he was essentially anuric.  He is being transferred to [facility] for hemodialysis.    Assessment & Plan:   Principal Problem:   Schizoaffective disorder, depressive type ([place]) Active Problems:   HIV disease ([place])   Cocaine abuse ([place])   AKI (acute kidney injury) ([place])   Rhabdomyolysis   Suicidal overdose ([place])   Acute hepatitis   Schizoaffective disorder, bipolar type ([place])   Acute renal failure (ARF) ([place])   Suicidal ideations   Chronic 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 coma ([place])  1-AKI; Secondary to Rhabdomyolysis. Secondary to cocaine.  -CK above 50,000. ---25,000--12,000---18,000 -Nephrology consulted. IV fluids discontinue. Patient to received  IV lasix.  -Renal US; negative for hydronephrosis.  -Had HD 2-15, 2-16, 2-18  PNA; Health care associated.  Patient spike fever 2-18 at 101. Report cough.  Check blood cultures.  Chest x ray left lower lobe infiltrates.  Check LFT   Suicidal overdose;  Sitter at bedside.  Patient has been IVC.  Psych consulted. Recommend inpatient psychiatric admission.  Started on zyprexa. monitor QT on EKG. QT 480, EKG 2-16; QT 470.  Monitor EKG periodically.  Will ask psych to follow up on patient.   Transaminases; could be related to muscle injury.   hepatitis panel  ordered.  Trending down.   HIV; Appreciate Dr [name] help.  Now on Prezcobix, Epivir, Retrovir.  He will need Emergency ADAP program  HIV medications at discharge to be determine, depending on need for HD.   Schizoaffective disorder, depressive type ([place]) -  started on Zyprexa.   Abdominal pain, nausea, vomiting. Resolved.  KUB negative,. Had BM.  Suspect uremic symptoms.  Denies abdominal pain today /  Hepatitis B;  Cocaine use.   Feet pain. ; he is complaining of feet pain. No sign or symptoms of compartment syndrome. No LE pain on range of motion. He denies calf, leg pain. He has feet pain. Pulse presents,.   DVT prophylaxis: heparin Code Status: Full code.  Family Communication: no family at bedside.  Disposition Plan: IVC, needs inpatient psych facility admission, getting HD>    Consultants:   Dr [employer].    Procedures:   none   Antimicrobials:  none   Subjective: He is more sleepy today. Spike fever at 101. Report cough.  Denies abdominal pain   Objective: Vitals:   02/25/17 0929 02/25/17 2102 02/26/17 0607 02/26/17 1230  BP: 136/75 138/71 (!) 149/78 (!) 166/81  Pulse: 74 74 80 80  Resp: 18   18  Temp: 98.3 F (36.8 C) 99.4 F (37.4 C) 99.6 F (37.6 C) (!) 101.5 F (38.6 C)  TempSrc: Oral Oral Oral Oral  SpO2: 99% 95% 93% 97%  Weight:      Height:  Intake/Output Summary (Last 24 hours) at 02/26/2017 1249 Last data filed at 02/26/2017 1230 Gross per 24 hour  Intake 840 ml  Output 0 ml  Net 840 ml   Filed Weights   02/24/17 1300 02/24/17 1549 02/25/17 0500  Weight: 109.8 kg (242 lb 1 oz) 109.8 kg (242 lb 1 oz) 109.8 kg (242 lb 1 oz)    Examination:  General exam: NAD Respiratory system: Normal respiratory effort. Crackles bases.  Cardiovascular system:  S 1, S 2 RRR Gastrointestinal system: BS present, soft, nt Central nervous system: sluggish., follows command.  Extremities: Symmetric power.     Data Reviewed: I have  personally reviewed following labs and imaging studies  CBC: Recent Labs  Lab 02/19/17 1742 02/20/17 0425 02/24/17 0031 02/24/17 1231 02/26/17 0808  WBC 18.4* 12.6* 10.0 7.7 7.3  NEUTROABS 14.8*  --   --   --   --   HGB 15.6 13.1 11.4* 11.6* 10.3*  HCT 46.8 37.1* 32.5* 33.3* 29.9*  MCV 89.7 88.3 85.3 85.8 86.2  PLT 208 142* 147* 141* 481   Basic Metabolic Panel: Recent Labs  Lab 02/20/17 0425  02/23/17 0626 02/24/17 0031 02/24/17 0549 02/25/17 1005 02/26/17 0550  NA 134*   < > 131* 130* 131*  133* 133* 133*  K 3.8   < > 4.0 4.4 4.1  4.2 4.2 5.1  CL 95*   < > 85* 84* 87*  89* 91* 91*  CO2 22   < > 22 23 22   21* 24 21*  GLUCOSE 120*   < > 119* 102* 97  95 117* 93  BUN 53*   < > 171* 193* 168*  167* 141* 156*  CREATININE 5.29*   < > 13.40* 14.68* 13.56*  13.45* 13.18* 14.99*  CALCIUM 6.8*   < > 7.0* 6.9* 7.1*  7.2* 7.4* 7.1*  MG 2.8*  --   --   --   --   --   --   PHOS  --    < > 7.3* 8.6* 7.6*  7.7* 7.9* 9.7*   < > = values in this interval not displayed.   GFR: Estimated Creatinine Clearance: 7.7 mL/min (A) (by C-G formula based on SCr of 14.99 mg/dL (H)). Liver Function Tests: Recent Labs  Lab 02/19/17 1742 02/20/17 0425  02/21/17 0931 02/22/17 0536 02/23/17 0626 02/24/17 0031 02/24/17 0100 02/24/17 0549 02/25/17 1005 02/26/17 0550  AST 2,317* 1,449*  --  744* 451*  --   --   --   --  62*  --   ALT 479* 359*  --  291* 236*  --   --  173*  --  113*  --   ALKPHOS 100 68  --  66 60  --   --   --   --  61  --   BILITOT 1.1 0.6  --  0.9 0.7  --   --   --   --  0.8  --   PROT 8.4* 6.1*  --  5.8* 5.6*  --   --   --   --  5.8*  --   ALBUMIN 4.5 3.2*   < > 3.0*  2.9* 2.7* 2.8* 2.6*  --  2.6*  2.6* 2.6*  2.6* 2.6*   < > = values in this interval not displayed.   Recent Labs  Lab 02/19/17 1847  LIPASE 64*   Recent Labs  Lab 02/19/17 2025  AMMONIA 43*   Coagulation Profile: Recent Labs  Lab 02/20/17 0425  INR 1.02   Cardiac Enzymes: Recent  Labs  Lab 02/22/17 0536 02/23/17 0626 02/24/17 0549 02/25/17 1005 02/26/17 0550  CKTOTAL >50,000* 25,509* 12,577* 18,548* 3,661*   BNP (last 3 results) No results for input(s): PROBNP in the last 8760 hours. HbA1C: No results for input(s): HGBA1C in the last 72 hours. CBG: Recent Labs  Lab 02/20/17 0730 02/20/17 1208  GLUCAP 107* 80   Lipid Profile: No results for input(s): CHOL, HDL, LDLCALC, TRIG, CHOLHDL, LDLDIRECT in the last 72 hours. Thyroid Function Tests: No results for input(s): TSH, T4TOTAL, FREET4, T3FREE, THYROIDAB in the last 72 hours. Anemia Panel: No results for input(s): VITAMINB12, FOLATE, FERRITIN, TIBC, IRON, RETICCTPCT in the last 72 hours. Sepsis Labs: No results for input(s): PROCALCITON, LATICACIDVEN in the last 168 hours.  No results found for this or any previous visit (from the past 240 hour(s)).       Radiology Studies: No results found.      Scheduled Meds: . darunavir-cobicistat  1 tablet Oral Q breakfast  . heparin injection (subcutaneous)  5,000 Units Subcutaneous Q8H  . lamiVUDine  50 mg Oral Daily  . OLANZapine  5 mg Oral QHS  . zidovudine  300 mg Oral Daily   Continuous Infusions:    LOS: 7 days    Time spent: 35 minutes    Elmarie Shiley, MD [facility] Pager [phone]  If 7PM-7AM, please contact night-coverage [email or web address] Password [facility] 02/26/2017, 12:49 PM

## 2017-02-26 NOTE — Progress Notes (Signed)
[facility] for Infectious Disease  Date of Admission:  02/19/2017     Total days of antibiotics 0  Patient ID: Dylan [name] is a 52 y.o. male with  Principal Problem:   Schizoaffective disorder, depressive type ([place]) Active Problems:   HIV disease ([place])   Cocaine abuse ([place])   AKI (acute kidney injury) ([place])   Rhabdomyolysis   Suicidal overdose ([place])   Acute hepatitis   Schizoaffective disorder, bipolar type ([place])   Acute renal failure (ARF) ([place])   Suicidal ideations   Chronic 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 coma ([place])   . darunavir-cobicistat  1 tablet Oral Q breakfast  . heparin injection (subcutaneous)  5,000 Units Subcutaneous Q8H  . lamiVUDine  50 mg Oral Daily  . OLANZapine  5 mg Oral QHS  . zidovudine  300 mg Oral Daily    INTERVAL HX: First HD session today. Hopeful this is not needed for long term. Not happy about regimen we need to try for HIV as of now. Psychiatric evaluation today for recent SI/HI.   Allergies  Allergen Reactions  . Penicillins Other (See Comments)    Childhood allergy  . Latex Rash  . Tape Rash    OBJECTIVE: Vitals:   02/25/17 0536 02/25/17 0929 02/25/17 2102 02/26/17 0607  BP: 139/65 136/75 138/71 (!) 149/78  Pulse: 75 74 74 80  Resp: (!) 21 18    Temp: 98.2 F (36.8 C) 98.3 F (36.8 C) 99.4 F (37.4 C) 99.6 F (37.6 C)  TempSrc: Oral Oral Oral Oral  SpO2: 96% 99% 95% 93%  Weight:      Height:       Body mass index is 31.08 kg/m.  Physical Exam  Constitutional: He is oriented to person, place, and time and well-developed, well-nourished, and in no distress.  HENT:  Mouth/Throat: No oral lesions. Normal dentition. No dental caries.  Eyes: No scleral icterus.  Cardiovascular: Normal rate, regular rhythm and normal heart sounds.  Pulmonary/Chest: Effort normal and breath sounds normal.  Abdominal: Soft. He exhibits no distension. There is no tenderness.  Lymphadenopathy:    He has  no cervical adenopathy.  Neurological: He is alert and oriented to person, place, and time.  Skin: Skin is warm and dry. No rash noted.  Psychiatric: Mood and affect normal.  Vitals reviewed.   Lab Results Lab Results  Component Value Date   WBC 7.3 02/26/2017   HGB 10.3 (L) 02/26/2017   HCT 29.9 (L) 02/26/2017   MCV 86.2 02/26/2017   PLT 150 02/26/2017    Lab Results  Component Value Date   CREATININE 14.99 (H) 02/26/2017   BUN 156 (H) 02/26/2017   NA 133 (L) 02/26/2017   K 5.1 02/26/2017   CL 91 (L) 02/26/2017   CO2 21 (L) 02/26/2017    Lab Results  Component Value Date   ALT 113 (H) 02/25/2017   AST 62 (H) 02/25/2017   ALKPHOS 61 02/25/2017   BILITOT 0.8 02/25/2017     Microbiology: No results found for this or any previous visit (from the past 240 hour(s)).           ASSESSMENT: 52 y.o. yo male with HIV infection admitted with AKI in setting of rhabdomyolysis with known cocaine use.   PLAN: 1. IVC and cannot leave AMA - will need inpatient psychiatry per psychiatry recommendations.  2. CK continues to trend down. Would assume this is more cocaine related  insult? Continue to monitor.  3. Will continue to monitor for kidney function pending any further changes with his HIV medications - hopefully he will have recovery and we can change back to STR like [place].   Janene Madeira, MSN, NP-C [facility] for [facility]ious [place] Cell: [phone] Pager: [phone]  02/26/2017  11:30 AM

## 2017-02-26 NOTE — Consult Note (Signed)
[facility] Psych Consult Progress Note  02/26/2017 2:38 PM Dylan [name]  MRN:  [id number] Subjective:   Dylan [name] was last seen on 2/13 for SI and HI. He reported a suicide attempt by overdose a week prior to admission. Zyprexa was started for psychosis and he was recommended for inpatient psychiatric hospitalization.   On interview, Dylan [name] reports intermittent SI. He last felt suicidal yesterday due to feeling depressed about his medical conditions. He denies HI or AVH. He continues to report poor sleep. Seroquel has been effective in the past. He reports that his appetite is "so so."   Principal Problem: Schizoaffective disorder, depressive type ([place]) Diagnosis:   Patient Active Problem List   Diagnosis Date Noted  . Schizoaffective disorder, bipolar type ([place]) [F25.0]   . Acute renal failure (ARF) ([place]) [N17.9]   . Suicidal ideations [R45.851]   . Chronic 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 coma (HCC) [B18.1]   . AKI (acute kidney injury) ([place]) [N17.9] 02/19/2017  . Rhabdomyolysis [M62.82] 02/19/2017  . Suicidal overdose ([place]) [T50.902A] 02/19/2017  . Acute hepatitis [B17.9] 02/19/2017  . Elevated LFTs [R94.5]   . Homicidal ideations [R45.850]   . Schizoaffective disorder, depressive type ([place]) [F25.1] 09/05/2011  . Cocaine abuse ([place]) [F14.10] 09/05/2011  . Degenerative disc disease [IMO0002] 08/21/2011  . HIV disease ([place]) [B20] 08/17/2011   Total Time spent with patient: 15 minutes  Past Psychiatric History: Schizoaffective disorder and cocaine abuse  Past Medical History:  Past Medical History:  Diagnosis Date  . Arthritis   . Bipolar 1 disorder ([place])   . Depression   . Hepatitis B   . Human immunodeficiency virus (HIV) ([place])   . Schizophrenia [facility])     Past Surgical History:  Procedure Laterality Date  . IR FLUORO GUIDE CV LINE RIGHT  02/22/2017  . IR US GUIDE VASC ACCESS RIGHT  02/22/2017  . TUMOR REMOVAL     From Chest   Family History: History  reviewed. No pertinent family history. Family Psychiatric  History: Mother-psychosis   Social History:  Social History   Substance and Sexual Activity  Alcohol Use No  . Alcohol/week: 0.0 oz   Comment: past     Social History   Substance and Sexual Activity  Drug Use Yes  . Types: Cocaine   Comment: past    Social History   Socioeconomic History  . Marital status: Married    Spouse name: None  . Number of children: None  . Years of education: None  . Highest education level: None  Social Needs  . Financial resource strain: None  . Food insecurity - worry: None  . Food insecurity - inability: None  . Transportation needs - medical: None  . Transportation needs - non-medical: None  Occupational History  . None  Tobacco Use  . Smoking status: Current Every Day Smoker    Packs/day: 1.00    Years: 15.00    Pack years: 15.00    Types: Cigarettes  . Smokeless tobacco: Current User  Substance and Sexual Activity  . Alcohol use: No    Alcohol/week: 0.0 oz    Comment: past  . Drug use: Yes    Types: Cocaine    Comment: past  . Sexual activity: No  Other Topics Concern  . None  Social History Narrative  . None    Sleep: Poor  Appetite:  Fair  Current Medications: Current Facility-Administered Medications  Medication Dose Route Frequency Provider Last Rate Last Dose  .  acetaminophen (TYLENOL) tablet 500 mg  500 mg Oral Q6H PRN Regalado, Belkys A, MD      . albuterol (PROVENTIL) (2.5 MG/3ML) 0.083% nebulizer solution 2.5 mg  2.5 mg Nebulization Q6H PRN Derrill Kay A, MD      . alteplase (CATHFLO ACTIVASE) injection 2 mg  2 mg Intracatheter Once PRN Edrick Oh, MD      . aztreonam (AZACTAM) 500 mg in dextrose 5 % 50 mL IVPB  500 mg Intravenous Q12H Regalado, Belkys A, MD      . darunavir-cobicistat (PREZCOBIX) 800-150 MG per tablet 1 tablet  1 tablet Oral Q breakfast [name], Lavell Islam, MD   1 tablet at 02/26/17 1231  . heparin injection 5,000 Units  5,000  Units Subcutaneous Q8H Saverio Danker, PA-C   5,000 Units at 02/24/17 [phone]  . lamiVUDine (EPIVIR) 10 MG/ML solution 50 mg  50 mg Oral Daily [name], Lavell Islam, MD   50 mg at 02/26/17 1230  . lidocaine (XYLOCAINE) 1 % (with pres) injection    PRN Corrie Mckusick, DO   10 mL at 02/22/17 1516  . OLANZapine (ZYPREXA) tablet 5 mg  5 mg Oral QHS Regalado, Belkys A, MD   5 mg at 02/25/17 2233  . ondansetron (ZOFRAN) injection 4 mg  4 mg Intravenous Q6H PRN Regalado, Belkys A, MD   4 mg at 02/23/17 0856  . simethicone (MYLICON) chewable tablet 80 mg  80 mg Oral Q6H PRN Regalado, Belkys A, MD   80 mg at 02/25/17 2233  . traMADol (ULTRAM) tablet 50 mg  50 mg Oral Q8H PRN Regalado, Belkys A, MD   50 mg at 02/25/17 2233  . vancomycin (VANCOCIN) 2,000 mg in sodium chloride 0.9 % 500 mL IVPB  2,000 mg Intravenous Once Bridgett Larsson, RPH      . zidovudine (RETROVIR) capsule 300 mg  300 mg Oral Daily [name], Lavell Islam, MD   300 mg at 02/26/17 1230    Lab Results:  Results for orders placed or performed during the hospital encounter of 02/19/17 (from the past 48 hour(s))  Renal function panel     Status: Abnormal   Collection Time: 02/25/17 10:05 AM  Result Value Ref Range   Sodium 133 (L) 135 - 145 mmol/L   Potassium 4.2 3.5 - 5.1 mmol/L   Chloride 91 (L) 101 - 111 mmol/L   CO2 24 22 - 32 mmol/L   Glucose, Bld 117 (H) 65 - 99 mg/dL   BUN 141 (H) 6 - 20 mg/dL   Creatinine, Ser 13.18 (H) 0.61 - 1.24 mg/dL   Calcium 7.4 (L) 8.9 - 10.3 mg/dL   Phosphorus 7.9 (H) 2.5 - 4.6 mg/dL   Albumin 2.6 (L) 3.5 - 5.0 g/dL   GFR calc non Af Amer 4 (L) >60 mL/min   GFR calc Af Amer 4 (L) >60 mL/min    Comment: (NOTE) The eGFR has been calculated using the CKD EPI equation. This calculation has not been validated in all clinical situations. eGFR's persistently <60 mL/min signify possible Chronic Kidney Disease. CORRECTED ON 02/17 AT 1129: PREVIOUSLY REPORTED AS 4    Anion gap 18 (H) 5 - 15    Comment:  Performed at [facility], [place] [address], [place], [place] [address]  CK     Status: Abnormal   Collection Time: 02/25/17 10:05 AM  Result Value Ref Range   Total CK 18,548 (H) 49 - 397 U/L    Comment: RESULTS CONFIRMED  BY MANUAL DILUTION Performed at [facility], [place] [address], [place], [place] [address]   Hepatic function panel     Status: Abnormal   Collection Time: 02/25/17 10:05 AM  Result Value Ref Range   Total Protein 5.8 (L) 6.5 - 8.1 g/dL   Albumin 2.6 (L) 3.5 - 5.0 g/dL   AST 62 (H) 15 - 41 U/L   ALT 113 (H) 17 - 63 U/L   Alkaline Phosphatase 61 38 - 126 U/L   Total Bilirubin 0.8 0.3 - 1.2 mg/dL   Bilirubin, Direct <0.1 (L) 0.1 - 0.5 mg/dL   Indirect Bilirubin NOT CALCULATED 0.3 - 0.9 mg/dL    Comment: Performed at [place] [address] [place], [place] [address]  Renal function panel     Status: Abnormal   Collection Time: 02/26/17  5:50 AM  Result Value Ref Range   Sodium 133 (L) 135 - 145 mmol/L   Potassium 5.1 3.5 - 5.1 mmol/L   Chloride 91 (L) 101 - 111 mmol/L   CO2 21 (L) 22 - 32 mmol/L   Glucose, Bld 93 65 - 99 mg/dL   BUN 156 (H) 6 - 20 mg/dL   Creatinine, Ser 14.99 (H) 0.61 - 1.24 mg/dL   Calcium 7.1 (L) 8.9 - 10.3 mg/dL   Phosphorus 9.7 (H) 2.5 - 4.6 mg/dL   Albumin 2.6 (L) 3.5 - 5.0 g/dL   GFR calc non Af Amer 3 (L) >60 mL/min   GFR calc Af Amer 4 (L) >60 mL/min    Comment: (NOTE) The eGFR has been calculated using the CKD EPI equation. This calculation has not been validated in all clinical situations. eGFR's persistently <60 mL/min signify possible Chronic Kidney Disease.    Anion gap 21 (H) 5 - 15    Comment: Performed at [facility], [place] [address], [place], [place] [address]  CK     Status: Abnormal   Collection Time: 02/26/17  5:50 AM  Result Value Ref Range   Total CK 3,661 (H) 49 - 397 U/L    Comment: Performed at [facility], [place] [address] [place], [place] [address]  CBC     Status: Abnormal    Collection Time: 02/26/17  8:08 AM  Result Value Ref Range   WBC 7.3 4.0 - 10.5 K/uL   RBC 3.47 (L) 4.22 - 5.81 MIL/uL   Hemoglobin 10.3 (L) 13.0 - 17.0 g/dL   HCT 29.9 (L) 39.0 - 52.0 %   MCV 86.2 78.0 - 100.0 fL   MCH 29.7 26.0 - 34.0 pg   MCHC 34.4 30.0 - 36.0 g/dL   RDW 12.4 11.5 - 15.5 %   Platelets 150 150 - 400 K/uL    Comment: Performed at [facility], [place] [address], [place], [place] [address]    Blood Alcohol level:  Lab Results  Component Value Date   [facility] <10 02/19/2017   ETH <11 04/11/2012    Musculoskeletal: Strength & Muscle Tone: within normal limits Gait & Station: UTA since patient was lying in bed.  Patient leans: N/A  Psychiatric Specialty Exam: Physical Exam  Nursing note and vitals reviewed. Constitutional: He is oriented to person, place, and time. He appears well-developed and well-nourished.  HENT:  Head: Normocephalic and atraumatic.  Neck: Normal range of motion.  Respiratory: Effort normal.  Musculoskeletal: Normal range of motion.  Neurological: He is alert and oriented to person, place, and time.  Skin: No  rash noted.  Psychiatric: His speech is normal and behavior is normal. Thought content normal. Cognition and memory are normal. He expresses impulsivity. He exhibits a depressed mood.    Review of Systems  Constitutional: Negative for chills and fever.  Cardiovascular: Negative for chest pain.  Gastrointestinal: Positive for constipation. Negative for abdominal pain, diarrhea, nausea and vomiting.  Psychiatric/Behavioral: Positive for depression. Negative for hallucinations, substance abuse and suicidal ideas. The patient has insomnia. The patient is not nervous/anxious.   All other systems reviewed and are negative.   Blood pressure (!) 166/81, pulse 80, temperature 99.1 F (37.3 C), temperature source Oral, resp. rate 18, height _0  (1.88 m), weight 110.6 kg (243 lb 13.3 oz), SpO2 97 %.Body mass index is 31.31 kg/m.  General  Appearance: Fairly Groomed, middle aged, obese, African American male, wearing a hospital gown and lying in bed. NAD.   Eye Contact:  Fair  Speech:  Clear and Coherent and Normal Rate  Volume:  Normal  Mood:  Depressed  Affect:  Constricted  Thought Process:  Goal Directed, Linear and Descriptions of Associations: Intact  Orientation:  Full (Time, Place, and Person)  Thought Content:  Logical  Suicidal Thoughts:  No  Homicidal Thoughts:  No  Memory:  Immediate;   Good Recent;   Good Remote;   Good  Judgement:  Fair  Insight:  Fair  Psychomotor Activity:  Normal  Concentration:  Concentration: Good and Attention Span: Good  Recall:  Good  Fund of Knowledge:  Good  Language:  Good  Akathisia:  No  Handed:  Right  AIMS (if indicated):   N/A  Assets:  Communication Skills Desire for Improvement  ADL's:  Intact  Cognition:  WNL  Sleep:   Poor    Assessment:  Dylan [name] is a 53 y.o. male who was admitted with AKI in the setting of rhabdomyolysis secondary to cocaine use. He continues to have intermittent SI and is unable to safety plan. He denies HI and psychosis today. He reports poor sleep. Seroquel has been effective for sleep in the past but it has a potential interaction with his HAART. Recommend increasing Zyprexa for sleep and psychosis.    Treatment Plan Summary: -Patient warrants inpatient psychiatric hospitalization given high risk of harm to self. -Continue bedside sitter.  -Increase Zyprexa 5 mg qhs to 10 mg qhs for psychosis and insomnia. Reviewed EKG and QTc 471 on 2/16.  -Patient is under IVC so he cannot leave the hospital. -Will sign off on patient at this time. Please consult psychiatry again as needed.     Faythe Dingwall, DO 02/26/2017, 2:38 PM

## 2017-02-26 NOTE — Progress Notes (Signed)
Bladder scan preformed with a value of 474. Patient does not have the urge to void. Transcribed order to place foley if bladder scan volume is greater than 300. Foley to be placed. Will continue to monitor.

## 2017-02-26 NOTE — Progress Notes (Signed)
Patient was able to void 250 cc and the post residual void with the bladder scanner was 0 ml. Therefore, no foley catheter is required. MD notified. No further orders. Will continue to monitor.

## 2017-02-26 NOTE — Progress Notes (Signed)
Patient refusing foley catheter to be placed. Education provided to patient. MD notified. MD stated to hold off on the foley catheter for right now to see if the patient can void on his on. If patient is able to void, foley catheter will not be necessary. Will continue to monitor.

## 2017-02-27 LAB — RENAL FUNCTION PANEL
Albumin: 2.5 g/dL — ABNORMAL LOW (ref 3.5–5.0)
Anion gap: 17 — ABNORMAL HIGH (ref 5–15)
BUN: 115 mg/dL — AB (ref 6–20)
CHLORIDE: 95 mmol/L — AB (ref 101–111)
CO2: 21 mmol/L — AB (ref 22–32)
CREATININE: 12.24 mg/dL — AB (ref 0.61–1.24)
Calcium: 7.3 mg/dL — ABNORMAL LOW (ref 8.9–10.3)
GFR calc Af Amer: 5 mL/min — ABNORMAL LOW (ref >60)
GFR calc non Af Amer: 4 mL/min — ABNORMAL LOW (ref >60)
Glucose, Bld: 103 mg/dL — ABNORMAL HIGH (ref 65–99)
Phosphorus: 8.3 mg/dL — ABNORMAL HIGH (ref 2.5–4.6)
Potassium: 4.9 mmol/L (ref 3.5–5.1)
SODIUM: 133 mmol/L — AB (ref 135–145)

## 2017-02-27 LAB — CK: Total CK: 2092 U/L — ABNORMAL HIGH (ref 49–397)

## 2017-02-27 MED ORDER — CALCIUM ACETATE (PHOS BINDER) 667 MG PO CAPS
1334.0000 mg | ORAL_CAPSULE | Freq: Three times a day (TID) | ORAL | Status: DC
  Administered 2017-02-27: 1334 mg via ORAL
  Filled 2017-02-27: qty 2

## 2017-02-27 MED ORDER — VANCOMYCIN HCL IN DEXTROSE 1-5 GM/200ML-% IV SOLN
1000.0000 mg | INTRAVENOUS | Status: DC
  Filled 2017-02-27: qty 200

## 2017-02-27 MED ORDER — RENA-VITE PO TABS
1.0000 | ORAL_TABLET | Freq: Every day | ORAL | Status: DC
  Administered 2017-02-27 – 2017-03-13 (×14): 1 via ORAL
  Filled 2017-02-27 (×14): qty 1

## 2017-02-27 MED ORDER — PRO-STAT SUGAR FREE PO LIQD
30.0000 mL | Freq: Two times a day (BID) | ORAL | Status: DC
  Administered 2017-02-27 – 2017-03-09 (×9): 30 mL via ORAL
  Filled 2017-02-27 (×17): qty 30

## 2017-02-27 MED ORDER — VANCOMYCIN HCL IN DEXTROSE 1-5 GM/200ML-% IV SOLN
1000.0000 mg | INTRAVENOUS | Status: DC
  Administered 2017-02-27: 1000 mg via INTRAVENOUS
  Filled 2017-02-27 (×2): qty 200

## 2017-02-27 NOTE — Care Management Note (Addendum)
Case Management Note  Patient Details  Name: Dylan [name] MRN: [id number] Date of Birth: 1966/01/04  Expected Discharge Date:   TBD               Expected Discharge Plan:  Psychiatric Hospital  In-House Referral:  Clinical Social Work  Discharge planning Services  CM Consult  Status of Service:  In process, will continue to follow;LSCW "Lorriane Shire" following for discharge placement.  Additional Comments: Discussed treatment plan with Dr. Tyrell Antonio "patient is not medically stable, needs nephrology clearance, remains Acute vs Chronic CKD, 2/19 BUN 115; Creatine, Ser 12.24, continues to receive HD w/  Temporary catheter access.  Has had HD x 3. PNA: On Vanc/ Aztreonam per primary; Patient spike fever 2-18 at 101;CXR left lobe infiltrates, +cough, crackles; Blood Culture pending. HIV medications at discharge to be determine, depending on need for HD".   Kristen Cardinal, RN  Nurse Case Manager [place] 02/27/2017, 3:30 PM

## 2017-02-27 NOTE — Progress Notes (Signed)
Pharmacy Antibiotic Note  DOMENICK QUEBEDEAUX is a 52 y.o. male admitted on 02/19/2017 with pneumonia. Patient has a PMH of schizophrenia and HIV and was admitted with acute kidney injury and elevated CPK > 50,000. Baseline creatinine ~1.  Pharmacy has been consulted for vancomycin dosing. Patient has been on temporary HD schedule, now appears to be Tuesday, Thursday, Saturday. Last HD today 2/18.  Plan: Vancomycin 1000 mg x1  With each full HD Follow-up on HD schedule and vancomycin plans   Follow-up on renal function, LOT, and clinical progression  Height: 6\' 2"  (188 cm) Weight: 243 lb 13.3 oz (110.6 kg) IBW/kg (Calculated) : 82.2  Temp (24hrs), Avg:99.6 F (37.6 C), Min:98.7 F (37.1 C), Max:101.5 F (38.6 C)  Recent Labs  Lab 02/24/17 0031 02/24/17 0549 02/24/17 1231 02/25/17 1005 02/26/17 0550 02/26/17 0808 02/27/17 0449  WBC 10.0  --  7.7  --   --  7.3  --   CREATININE 14.68* 13.56*  13.45*  --  13.18* 14.99*  --  12.24*    Estimated Creatinine Clearance: 9.5 mL/min (A) (by C-G formula based on SCr of 12.24 mg/dL (H)).    Allergies  Allergen Reactions  . Penicillins Other (See Comments)    Childhood allergy  . Latex Rash  . Tape Rash    Antimicrobials this admission: Vanc 2/18 >>   Thank you for allowing pharmacy to be a part of this patient's care.  Georga Bora, PharmD Clinical Pharmacist 02/27/2017 10:59 AM

## 2017-02-27 NOTE — Progress Notes (Signed)
PROGRESS NOTE    Dylan [name]  GBT:[id number] DOB: Aug 18, 1965 DOA: 02/19/2017 PCP: Dylan Basques, MD    Brief Narrative: The patient is a 52 year old male with history of HIV, schizophrenia who presented with a 3-week history of suicidal and homicidal ideation.  He took a bunch of pills for friend 2 days prior to admission and had been doing cocaine.  He felt like he wanted to kill himself and other people.  He came to the emergency department because his urine was very dark and he had not been making much urine.  He was found to have rhabdomyolysis with acute renal failure.  He was started on IV fluids but his creatinine continued to rise and he was essentially anuric.  He was transferred to [facility] for hemodialysis.  Patient was started on HD 2-15, he has received 4 treatment so far. ID was consulted and his antiretroviral regimen was change to avoid nephrotoxicity. He spike fever 2-18 and was diagnosed with PNA> he was started on vancomycin dn aztreonam 2-18. Psych is recommending inpatient psychiatric facility admission.  Will need to determine prior to transfer to psychiatric facility if patient will required long term HD. Also need to treat for PNA.     Assessment & Plan:   Principal Problem:   Schizoaffective disorder, depressive type ([place]) Active Problems:   HIV disease ([name])   Cocaine abuse ([name])   AKI (acute kidney injury) ([name])   Rhabdomyolysis   Suicidal overdose ([place])   Acute hepatitis   Schizoaffective disorder, bipolar type ([name])   Acute renal failure (ARF) ([name])   Suicidal ideations   Chronic 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 coma ([name])  1-AKI; Secondary to Rhabdomyolysis. Secondary to cocaine.  -CK above 50,000. ---25,000--12,000---18,000---2,000 -Nephrology consulted. IV fluids discontinue. Patient to received  IV lasix.  -Renal US; negative for hydronephrosis.  -Had HD 2-15, 2-16, 2-18 and again 2-19  PNA; Health care associated.   Patient spike fever 2-18 at 101. Report cough.   blood cultures pending.  Chest x ray left lower lobe infiltrates.  LFT  Trending down.   Suicidal overdose;  Sitter at bedside.  Patient has been IVC.  Psych consulted. Recommend inpatient psychiatric admission.  Started on zyprexa. monitor QT on EKG. QT 480, EKG 2-16; QT 470.  Monitor EKG periodically.  Psych recommend increase Zyprexa to 10 mg. Will repeat EKG prior to increase dose.   Transaminases; could be related to muscle injury.   hepatitis panel ordered.  Trending down.   HIV; Appreciate Dr Dylan [name] help.  Now on Prezcobix, Epivir, Retrovir.  He will need Emergency ADAP program  HIV medications at discharge to be determine, depending on need for HD.   Schizoaffective disorder, depressive type ([name]) -  started on Zyprexa.   Abdominal pain, nausea, vomiting. Resolved.  KUB negative,. Had BM.  Suspect uremic symptoms.  Denies abdominal pain today /  Hepatitis B;  Cocaine use.   Feet pain. ; he is complaining of feet pain. No sign or symptoms of compartment syndrome. No LE pain on range of motion. He denies calf, leg pain. He has feet pain. Pulse presents,.   DVT prophylaxis: heparin Code Status: Full code.  Family Communication: no family at bedside.  Disposition Plan: IVC, needs inpatient psych facility admission, getting HD>    Consultants:   Dr [employer].    Procedures:   none   Antimicrobials:  none   Subjective: He is alert, denies worsening cough. Denies abdominal  pain   Objective: Vitals:   02/27/17 1408 02/27/17 1415 02/27/17 1430 02/27/17 1500  BP: (!) 152/88 (!) 153/96 (!) 149/85 (!) 145/89  Pulse: 78 78 69 70  Resp:  16    Temp:      TempSrc:      SpO2:      Weight:      Height:        Intake/Output Summary (Last 24 hours) at 02/27/2017 1540 Last data filed at 02/27/2017 0845 Gross per 24 hour  Intake 640 ml  Output 300 ml  Net 340 ml   Filed Weights   02/25/17 0500 02/26/17  0745 02/27/17 1400  Weight: 109.8 kg (242 lb 1 oz) 110.6 kg (243 lb 13.3 oz) 111.5 kg (245 lb 13 oz)    Examination:  General exam: NAD Respiratory system: Normal respiratory effort, crackles bases.  Cardiovascular system:  S 1, S 2 RRR Gastrointestinal system: BS present, soft, nt  Central nervous system: alert, follows command  Extremities: symmetric power.     Data Reviewed: I have personally reviewed following labs and imaging studies  CBC: Recent Labs  Lab 02/24/17 0031 02/24/17 1231 02/26/17 0808  WBC 10.0 7.7 7.3  HGB 11.4* 11.6* 10.3*  HCT 32.5* 33.3* 29.9*  MCV 85.3 85.8 86.2  PLT 147* 141* 297   Basic Metabolic Panel: Recent Labs  Lab 02/24/17 0031 02/24/17 0549 02/25/17 1005 02/26/17 0550 02/27/17 0449  NA 130* 131*  133* 133* 133* 133*  K 4.4 4.1  4.2 4.2 5.1 4.9  CL 84* 87*  89* 91* 91* 95*  CO2 23 22  21* 24 21* 21*  GLUCOSE 102* 97  95 117* 93 103*  BUN 193* 168*  167* 141* 156* 115*  CREATININE 14.68* 13.56*  13.45* 13.18* 14.99* 12.24*  CALCIUM 6.9* 7.1*  7.2* 7.4* 7.1* 7.3*  PHOS 8.6* 7.6*  7.7* 7.9* 9.7* 8.3*   GFR: Estimated Creatinine Clearance: 9.5 mL/min (A) (by C-G formula based on SCr of 12.24 mg/dL (H)). Liver Function Tests: Recent Labs  Lab 02/21/17 0931 02/22/17 0536  02/24/17 0100 02/24/17 0549 02/25/17 1005 02/26/17 0550 02/26/17 1349 02/27/17 0449  AST 744* 451*  --   --   --  62*  --  45*  --   ALT 291* 236*  --  173*  --  113*  --  95*  --   ALKPHOS 66 60  --   --   --  61  --  69  --   BILITOT 0.9 0.7  --   --   --  0.8  --  0.7  --   PROT 5.8* 5.6*  --   --   --  5.8*  --  5.8*  --   ALBUMIN 3.0*  2.9* 2.7*   < >  --  2.6*  2.6* 2.6*  2.6* 2.6* 2.6* 2.5*   < > = values in this interval not displayed.   No results for input(s): LIPASE, AMYLASE in the last 168 hours. No results for input(s): AMMONIA in the last 168 hours. Coagulation Profile: No results for input(s): INR, PROTIME in the last 168  hours. Cardiac Enzymes: Recent Labs  Lab 02/23/17 0626 02/24/17 0549 02/25/17 1005 02/26/17 0550 02/27/17 0449  CKTOTAL 25,509* 98,921* 19,417* 3,661* 2,092*   BNP (last 3 results) No results for input(s): PROBNP in the last 8760 hours. HbA1C: No results for input(s): HGBA1C in the last 72 hours. CBG: No results for input(s): GLUCAP in the  last 168 hours. Lipid Profile: No results for input(s): CHOL, HDL, LDLCALC, TRIG, CHOLHDL, LDLDIRECT in the last 72 hours. Thyroid Function Tests: No results for input(s): TSH, T4TOTAL, FREET4, T3FREE, THYROIDAB in the last 72 hours. Anemia Panel: No results for input(s): VITAMINB12, FOLATE, FERRITIN, TIBC, IRON, RETICCTPCT in the last 72 hours. Sepsis Labs: No results for input(s): PROCALCITON, LATICACIDVEN in the last 168 hours.  Recent Results (from the past 240 hour(s))  Culture, blood (routine x 2)     Status: None (Preliminary result)   Collection Time: 02/26/17  2:02 PM  Result Value Ref Range Status   Specimen Description BLOOD LEFT ANTECUBITAL  Final   Special Requests   Final    BOTTLES DRAWN AEROBIC AND ANAEROBIC Blood Culture adequate volume   Culture   Final    NO GROWTH < 24 HOURS Performed at [facility], [address], [place], [place] [address]    Report Status PENDING  Incomplete  Culture, blood (routine x 2)     Status: None (Preliminary result)   Collection Time: 02/26/17  2:11 PM  Result Value Ref Range Status   Specimen Description BLOOD RIGHT ANTECUBITAL  Final   Special Requests   Final    BOTTLES DRAWN AEROBIC AND ANAEROBIC Blood Culture results may not be optimal due to an excessive volume of blood received in culture bottles   Culture   Final    NO GROWTH < 24 HOURS Performed at [facility], [place] [address] [place], [place] [address]    Report Status PENDING  Incomplete         Radiology Studies: Dg Chest Port 1 View  Result Date: 02/26/2017 CLINICAL DATA:  Fever [id number], SOB x "few  days", no CP EXAM: PORTABLE CHEST 1 VIEW COMPARISON:  02/19/2017 FINDINGS: Patient's right IJ sheath, tip overlying the level of the cavoatrial junction. The heart is mildly enlarged. There are perihilar opacifications consistent with pulmonary edema. More focal opacity in the medial left lung base may represent atelectasis, infiltrate, or confluent edema. IMPRESSION: 1. Findings consistent with mild edema. 2. Left lower lobe opacity likely representing infectious infiltrate. Electronically Signed   By: Nolon Nations M.D.   On: 02/26/2017 13:13        Scheduled Meds: . calcium acetate  1,334 mg Oral TID WC  . darunavir-cobicistat  1 tablet Oral Q breakfast  . feeding supplement (PRO-STAT SUGAR FREE 64)  30 mL Oral BID  . heparin injection (subcutaneous)  5,000 Units Subcutaneous Q8H  . lamiVUDine  50 mg Oral Daily  . multivitamin  1 tablet Oral QHS  . OLANZapine  5 mg Oral QHS  . zidovudine  300 mg Oral Daily   Continuous Infusions: . aztreonam Stopped (02/27/17 0604)  . vancomycin       LOS: 8 days    Time spent: 35 minutes    Elmarie Shiley, MD [facility] Pager [phone]  If 7PM-7AM, please contact night-coverage [email or web address] Password [facility] 02/27/2017, 3:40 PM

## 2017-02-27 NOTE — Clinical Social Work Note (Addendum)
CSW informed attending MD of bed availability for patient at [facility] psychiatric facility on Wednesday. Per MD, patient not medically stable. He is still being treated for AKI, and is on abx for a fever.  SW Tourist information centre manager contacted and updated. CSW will f/u with MD on Wednesday regarding patient's medical status before contacting [facility].  Ryenn Howeth Givens, MSW, LCSW Licensed Clinical Social Worker [place] [phone]

## 2017-02-27 NOTE — Progress Notes (Signed)
[facility] Progress Note   Subjective: IVC pt-sitter at bedside. No C/Os other than feeling weak.   Objective Vitals:   02/26/17 1230 02/26/17 1339 02/26/17 1344 02/26/17 1700  BP: (!) 166/81   133/81  Pulse: 80   80  Resp: 18   17  Temp: (!) 101.5 F (38.6 C) 99.1 F (37.3 C) 99.1 F (37.3 C) 98.7 F (37.1 C)  TempSrc: Oral Oral Oral Oral  SpO2: 97%  97% 97%  Weight:      Height:       Physical Exam General: WN,WD NAD Heart: S1,S2 RRR Lungs: CTAB A/P Abdomen: Active BS, NT, ND Extremities: No LE edema Dialysis Access: RIJ Temporary catheter   Additional Objective Labs: Basic Metabolic Panel: Recent Labs  Lab 02/25/17 1005 02/26/17 0550 02/27/17 0449  NA 133* 133* 133*  K 4.2 5.1 4.9  CL 91* 91* 95*  CO2 24 21* 21*  GLUCOSE 117* 93 103*  BUN 141* 156* 115*  CREATININE 13.18* 14.99* 12.24*  CALCIUM 7.4* 7.1* 7.3*  PHOS 7.9* 9.7* 8.3*   Liver Function Tests: Recent Labs  Lab 02/22/17 0536  02/24/17 0100  02/25/17 1005 02/26/17 0550 02/26/17 1349 02/27/17 0449  AST 451*  --   --   --  62*  --  45*  --   ALT 236*  --  173*  --  113*  --  95*  --   ALKPHOS 60  --   --   --  61  --  69  --   BILITOT 0.7  --   --   --  0.8  --  0.7  --   PROT 5.6*  --   --   --  5.8*  --  5.8*  --   ALBUMIN 2.7*   < >  --    < > 2.6*  2.6* 2.6* 2.6* 2.5*   < > = values in this interval not displayed.   No results for input(s): LIPASE, AMYLASE in the last 168 hours. CBC: Recent Labs  Lab 02/24/17 0031 02/24/17 1231 02/26/17 0808  WBC 10.0 7.7 7.3  HGB 11.4* 11.6* 10.3*  HCT 32.5* 33.3* 29.9*  MCV 85.3 85.8 86.2  PLT 147* 141* 150   Blood Culture No results found for: SDES, SPECREQUEST, CULT, REPTSTATUS  Cardiac Enzymes: Recent Labs  Lab 02/23/17 0626 02/24/17 0549 02/25/17 1005 02/26/17 0550 02/27/17 0449  CKTOTAL 25,509* 12,577* 18,548* 3,661* 2,092*   CBG: Recent Labs  Lab 02/20/17 1208  GLUCAP 80   Iron Studies: No results  for input(s): IRON, TIBC, TRANSFERRIN, FERRITIN in the last 72 hours. '@lablastinr3'$ @ Studies/Results: Dg Chest Port 1 View  Result Date: 02/26/2017 CLINICAL DATA:  Fever 344092, SOB x "few days", no CP EXAM: PORTABLE CHEST 1 VIEW COMPARISON:  02/19/2017 FINDINGS: Patient's right IJ sheath, tip overlying the level of the cavoatrial junction. The heart is mildly enlarged. There are perihilar opacifications consistent with pulmonary edema. More focal opacity in the medial left lung base may represent atelectasis, infiltrate, or confluent edema. IMPRESSION: 1. Findings consistent with mild edema. 2. Left lower lobe opacity likely representing infectious infiltrate. Electronically Signed   By: Nolon Nations M.D.   On: 02/26/2017 13:13   Medications: . aztreonam Stopped (02/27/17 0604)   . darunavir-cobicistat  1 tablet Oral Q breakfast  . heparin injection (subcutaneous)  5,000 Units Subcutaneous Q8H  . lamiVUDine  50 mg Oral Daily  . OLANZapine  5 mg Oral QHS  . zidovudine  300 mg Oral Daily     Assessment/Plan: 1. AKI 2/2 [facility] admitting CK >50000 now 2092 today. 1st HD 02/23/17. Has had HD X 3, Scr 12.2 EGFR 4-5. HD today. Start T, Th, S and PRN HD schedule. Does not appear to have excess volume on board however still positive 11.5 liters since adm. UFG 2-2.5 liters in HD as tolerated. CXR wet on 2/18, will pull fluid with next HD tomorrow.  3. Anemia - HGB 10.3. Order Iron panel. Follow trend.  4. Secondary hyperparathyroidism - Ca 6.9 C Ca 8.0 Phos 8.6 Start Calcium acetate 667 mg 2 caps PO TID AC. Check PTH.  5. HIV: ID consulted. Prezcobix, Epivir and Retrovir. Per primary 6. Nutrition - Albumin 2.6. Change to renal diet, add prostat, renal vit.  7. PNA: On Vanc/ Aztreonam per primary.  8. Schizoaffective disorder. On Zyprexa.  9. Suicide Attempt/OD-IVD. Psych consult.   Rita H. Brown NP-C 02/27/2017, 10:05 AM  [facility] [phone]  Pt seen, examined, agree w  assess/plan as above with additions as indicated.  Kelly Splinter MD [employer] pager [phone]    cell [phone] 02/27/2017, 2:51 PM

## 2017-02-27 NOTE — Progress Notes (Signed)
Patient ID: Dylan [name], male   DOB: 1965-03-08, 52 y.o.   MRN: [id number]   We were asked to transfer Dylan [name] to Behavioral Medicine Unit at [facility]. The patient has a history of schizoaffective disorder, Cocaine use disorder, recent overdose on medications and reports suicidal and homicidal ideation. The patient meets criteria for psychiatric admission. However, I do not believe that he is medically cleared. We are unable to accept a patient who requires IV medications.  As per today's note by Dr. Tyrell Antonio "He spike fever 2-18 and was diagnosed with PNA> he was started on vancomycin dn aztreonam 2-18. Psych is recommending inpatient psychiatric facility admission. Will need to determine prior to transfer to psychiatric facility if patient will required long term HD. Also need to treat for PNA".  Please contact us again if he still needs psychiatric treatment when medically stable.  Thank you, Dylan [name] . Marland Kitchen

## 2017-02-28 ENCOUNTER — Inpatient Hospital Stay
Admission: AD | Admit: 2017-02-28 | Payer: No Typology Code available for payment source | Source: Intra-hospital | Admitting: Psychiatry

## 2017-02-28 DIAGNOSIS — N171 Acute kidney failure with acute cortical necrosis: Secondary | ICD-10-CM

## 2017-02-28 LAB — RENAL FUNCTION PANEL
ANION GAP: 16 — AB (ref 5–15)
Albumin: 2.5 g/dL — ABNORMAL LOW (ref 3.5–5.0)
Albumin: 2.5 g/dL — ABNORMAL LOW (ref 3.5–5.0)
Anion gap: 14 (ref 5–15)
BUN: 74 mg/dL — ABNORMAL HIGH (ref 6–20)
BUN: 74 mg/dL — ABNORMAL HIGH (ref 6–20)
CALCIUM: 7.8 mg/dL — AB (ref 8.9–10.3)
CO2: 24 mmol/L (ref 22–32)
CO2: 23 mmol/L (ref 22–32)
CREATININE: 9.79 mg/dL — AB (ref 0.61–1.24)
Calcium: 7.7 mg/dL — ABNORMAL LOW (ref 8.9–10.3)
Chloride: 94 mmol/L — ABNORMAL LOW (ref 101–111)
Chloride: 95 mmol/L — ABNORMAL LOW (ref 101–111)
Creatinine, Ser: 9.85 mg/dL — ABNORMAL HIGH (ref 0.61–1.24)
GFR calc Af Amer: 6 mL/min — ABNORMAL LOW (ref >60)
GFR calc non Af Amer: 5 mL/min — ABNORMAL LOW (ref >60)
GFR, EST AFRICAN AMERICAN: 6 mL/min — AB (ref >60)
GFR, EST NON AFRICAN AMERICAN: 5 mL/min — AB (ref >60)
Glucose, Bld: 96 mg/dL (ref 65–99)
Glucose, Bld: 149 mg/dL — ABNORMAL HIGH (ref 65–99)
Phosphorus: 7.4 mg/dL — ABNORMAL HIGH (ref 2.5–4.6)
Phosphorus: 7.5 mg/dL — ABNORMAL HIGH (ref 2.5–4.6)
Potassium: 4.4 mmol/L (ref 3.5–5.1)
Potassium: 4.2 mmol/L (ref 3.5–5.1)
SODIUM: 134 mmol/L — AB (ref 135–145)
Sodium: 132 mmol/L — ABNORMAL LOW (ref 135–145)

## 2017-02-28 LAB — CBC
HCT: 29.5 % — ABNORMAL LOW (ref 39.0–52.0)
HCT: 29.7 % — ABNORMAL LOW (ref 39.0–52.0)
HEMOGLOBIN: 10.2 g/dL — AB (ref 13.0–17.0)
Hemoglobin: 10.5 g/dL — ABNORMAL LOW (ref 13.0–17.0)
MCH: 30.4 pg (ref 26.0–34.0)
MCH: 31.2 pg (ref 26.0–34.0)
MCHC: 34.6 g/dL (ref 30.0–36.0)
MCHC: 35.4 g/dL (ref 30.0–36.0)
MCV: 88.1 fL (ref 78.0–100.0)
MCV: 88.1 fL (ref 78.0–100.0)
PLATELETS: 142 10*3/uL — AB (ref 150–400)
Platelets: 145 10*3/uL — ABNORMAL LOW (ref 150–400)
RBC: 3.35 MIL/uL — AB (ref 4.22–5.81)
RBC: 3.37 MIL/uL — ABNORMAL LOW (ref 4.22–5.81)
RDW: 12.8 % (ref 11.5–15.5)
RDW: 12.8 % (ref 11.5–15.5)
WBC: 5.7 10*3/uL (ref 4.0–10.5)
WBC: 5.6 K/uL (ref 4.0–10.5)

## 2017-02-28 LAB — CK: Total CK: 1141 U/L — ABNORMAL HIGH (ref 49–397)

## 2017-02-28 MED ORDER — SODIUM CHLORIDE 0.9 % IV SOLN: 100.0000 mL | INTRAVENOUS | Status: DC | PRN

## 2017-02-28 MED ORDER — LIDOCAINE-PRILOCAINE 2.5-2.5 % EX CREA
1.0000 | TOPICAL_CREAM | CUTANEOUS | Status: DC | PRN
Start: 2017-02-28 — End: 2017-02-28

## 2017-02-28 MED ORDER — PENTAFLUOROPROP-TETRAFLUOROETH EX AERO: 1.0000 "application " | INHALATION_SPRAY | CUTANEOUS | Status: DC | PRN

## 2017-02-28 MED ORDER — LIDOCAINE-PRILOCAINE 2.5-2.5 % EX CREA: 1.0000 "application " | TOPICAL_CREAM | CUTANEOUS | Status: DC | PRN

## 2017-02-28 MED ORDER — HEPARIN SODIUM (PORCINE) 1000 UNIT/ML DIALYSIS
1000.0000 [IU] | INTRAMUSCULAR | Status: DC | PRN
Start: 2017-02-28 — End: 2017-02-28

## 2017-02-28 MED ORDER — HEPARIN SODIUM (PORCINE) 1000 UNIT/ML DIALYSIS: 3500.0000 [IU] | INTRAMUSCULAR | Status: DC | PRN

## 2017-02-28 MED ORDER — ALTEPLASE 2 MG IJ SOLR: 2.0000 mg | Freq: Once | INTRAMUSCULAR | Status: DC | PRN

## 2017-02-28 MED ORDER — LIDOCAINE HCL (PF) 1 % IJ SOLN
5.0000 mL | INTRAMUSCULAR | Status: DC | PRN
Start: 2017-02-28 — End: 2017-02-28

## 2017-02-28 MED ORDER — SODIUM CHLORIDE 0.9 % IV SOLN
100.0000 mL | INTRAVENOUS | Status: DC | PRN
Start: 2017-02-28 — End: 2017-02-28

## 2017-02-28 MED ORDER — HEPARIN SODIUM (PORCINE) 1000 UNIT/ML DIALYSIS: 1000.0000 [IU] | INTRAMUSCULAR | Status: DC | PRN

## 2017-02-28 MED ORDER — HEPARIN SODIUM (PORCINE) 1000 UNIT/ML DIALYSIS: 4000.0000 [IU] | INTRAMUSCULAR | Status: DC | PRN

## 2017-02-28 NOTE — Clinical Social Work Note (Addendum)
IV-C paperwork completed, signed by MD, notarized and faxed to magistrate, with copies in patient's chart. A police office has also come to the hospital to serve patient. CSW will continue to follow, provide SW interventions services as needed and will initiate psych placement search once patient determined to be medically stable.   Preslea Rhodus Givens, MSW, LCSW Licensed Clinical Social Worker [place] [phone]

## 2017-02-28 NOTE — Progress Notes (Addendum)
[facility] Progress Note   Subjective: Awake, alert, C/O feeling tired. Denies SOB. Sitter at bedside. Mood seems appropriate to occasion.   Objective Vitals:   02/27/17 1800 02/27/17 1821 02/27/17 1959 02/28/17 0552  BP: (!) 160/97 (!) 157/85 (!) 145/75 (!) 143/83  Pulse: 69 70 80 75  Resp:  13 18 17   Temp:  98.2 F (36.8 C) 99.4 F (37.4 C) 99.3 F (37.4 C)  TempSrc:  Oral    SpO2:  97% 95% 96%  Weight:  108.6 kg (239 lb 6.7 oz) 108.8 kg (239 lb 13.8 oz)   Height:       Physical Exam General: WN,WD NAD Heart: S1,S2, RRR slight JVD at 30 degrees.  Lungs: CTAB A/P Abdomen: Active BS NT,ND Extremities: No LE edema.  Dialysis Access: RIJ Temporary catheter   Additional Objective Labs: Basic Metabolic Panel: Recent Labs  Lab 02/26/17 0550 02/27/17 0449 02/28/17 0600  NA 133* 133* 134*  K 5.1 4.9 4.4  CL 91* 95* 94*  CO2 21* 21* 24  GLUCOSE 93 103* 96  BUN 156* 115* 74*  CREATININE 14.99* 12.24* 9.79*  CALCIUM 7.1* 7.3* 7.8*  PHOS 9.7* 8.3* 7.4*   Liver Function Tests: Recent Labs  Lab 02/22/17 0536  02/24/17 0100  02/25/17 1005  02/26/17 1349 02/27/17 0449 02/28/17 0600  AST 451*  --   --   --  62*  --  45*  --   --   ALT 236*  --  173*  --  113*  --  95*  --   --   ALKPHOS 60  --   --   --  61  --  69  --   --   BILITOT 0.7  --   --   --  0.8  --  0.7  --   --   PROT 5.6*  --   --   --  5.8*  --  5.8*  --   --   ALBUMIN 2.7*   < >  --    < > 2.6*  2.6*   < > 2.6* 2.5* 2.5*   < > = values in this interval not displayed.   No results for input(s): LIPASE, AMYLASE in the last 168 hours. CBC: Recent Labs  Lab 02/24/17 0031 02/24/17 1231 02/26/17 0808 02/28/17 0600  WBC 10.0 7.7 7.3 5.7  HGB 11.4* 11.6* 10.3* 10.2*  HCT 32.5* 33.3* 29.9* 29.5*  MCV 85.3 85.8 86.2 88.1  PLT 147* 141* 150 142*   Blood Culture    Component Value Date/Time   SDES BLOOD RIGHT ANTECUBITAL 02/26/2017 1411   SPECREQUEST  02/26/2017 1411    BOTTLES  DRAWN AEROBIC AND ANAEROBIC Blood Culture results may not be optimal due to an excessive volume of blood received in culture bottles   CULT  02/26/2017 1411    NO GROWTH < 24 HOURS Performed at [facility] [address], [place], [place] [address]    REPTSTATUS PENDING 02/26/2017 1411    Cardiac Enzymes: Recent Labs  Lab 02/24/17 0549 02/25/17 1005 02/26/17 0550 02/27/17 0449 02/28/17 0600  CKTOTAL 12,577* 53,646* 3,661* 2,092* 1,141*   CBG: No results for input(s): GLUCAP in the last 168 hours. Iron Studies: No results for input(s): IRON, TIBC, TRANSFERRIN, FERRITIN in the last 72 hours. @lablastinr3 @ Studies/Results: Dg Chest Port 1 View  Result Date: 02/26/2017 CLINICAL DATA:  Fever 344092, SOB x "few days", no CP EXAM: PORTABLE CHEST 1 VIEW COMPARISON:  02/19/2017 FINDINGS: Patient's right IJ sheath, tip overlying the level of the cavoatrial junction. The heart is mildly enlarged. There are perihilar opacifications consistent with pulmonary edema. More focal opacity in the medial left lung base may represent atelectasis, infiltrate, or confluent edema. IMPRESSION: 1. Findings consistent with mild edema. 2. Left lower lobe opacity likely representing infectious infiltrate. Electronically Signed   By: Nolon Nations M.D.   On: 02/26/2017 13:13   Medications: . aztreonam 500 mg (02/28/17 0710)  . vancomycin Stopped (02/27/17 1807)   . darunavir-cobicistat  1 tablet Oral Q breakfast  . feeding supplement (PRO-STAT SUGAR FREE 64)  30 mL Oral BID  . heparin injection (subcutaneous)  5,000 Units Subcutaneous Q8H  . lamiVUDine  50 mg Oral Daily  . multivitamin  1 tablet Oral QHS  . OLANZapine  5 mg Oral QHS  . zidovudine  300 mg Oral Daily     Assessment/Plan: 1. AKI 2/2 [facility] admitting CK >50000 CK total 1141 today. 1st HD 02/23/17. Start T, Th, S and PRN HD schedule. CXR 02/26/17 showed mild edema. HD 02/27/17 Pre wt 111.5 Net UF 2.0 liters Post wt 108.6. Now  positive 9879 cc since adm. Next HD 03/01/17.  Continue lowering volume as tolerated. No clear suggestion of underlying glomerular disease, such as HIV nephropathy.  UA protein is only 100, and [place] in unremarkable w/ normal sized kidneys and normal echo.  Will get urine prot:creat ratio.  Otherwise we are attributing renal failure to rhabdo w/ assoc ATN.   2. HCAP;CXR with LLL infiltrates. BC NG X 24 hrs. Tmax 99.4 yesterday. Vanc/Aztreonam per primary. Vol overload, getting vol off w HD now 3. Anemia - HGB 10.2. Order Iron panel. Follow trend.  4. Secondary hyperparathyroidism - Ca 7.8 C Ca 9 Phos 7.4 Monitor labs. No Ca products in acute phase of AKI except for correction of hyperkalemia.  5. HIV: ID consulted. Prezcobix, Epivir and Retrovir. Per primary 6. Nutrition - Albumin 2.6. Change to renal diet, add prostat, renal vit.  7. PNA: On Vanc/ Aztreonam per primary.  8. Schizoaffective disorder. On Zyprexa.  9. Suicide Attempt/OD-IVD. Psych consult. Holding transfer to psych facility until medically stable.    Rita H. Brown NP-C 02/28/2017, 7:50 AM  [facility] [phone]  Pt seen, examined, agree w assess/plan as above with additions as indicated.  Kelly Splinter MD [employer] pager [phone]    cell [phone] 02/28/2017, 2:42 PM

## 2017-02-28 NOTE — Progress Notes (Signed)
PROGRESS NOTE  Dylan [name] DGU:[id number] DOB: [birth year] DOA: 02/19/2017 PCP: Carlyle Basques, MD  HPI/Recap of past 47 hours: 52 year old male with history of HIV, schizophrenia who presented with a 3-week history of suicidal and homicidal ideation. He took a bunch of pills 2 days prior to admission and had been doing cocaine. He felt like he wanted to kill himself and other people. He came to the ER due to sig decreased urine output. In the ED, was found to have rhabdomyolysis with acute renal failure requiring HD which was started on 2/15. On 2/18 spiked a temp, found to have PNA. Psych is recommending inpatient psychiatric facility admission, once medically stable and the need for long term HD.  Today, pt denies any new complaints, no chest pain, SOB, abdominal pain, N/V/D, fever/chills.  Assessment/Plan: Principal Problem:   Schizoaffective disorder, depressive type ([place]) Active Problems:   HIV disease ([place])   Cocaine abuse ([place])   AKI (acute kidney injury) ([place])   Rhabdomyolysis   Suicidal overdose ([place])   Acute hepatitis   Schizoaffective disorder, bipolar type ([place])   Acute renal failure (ARF) ([place])   Suicidal ideations   Chronic 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 coma ([place])  Acute renal failure requiring HD Likely due to Rhabdomyolysis Vs med induced-->ATN  CK above 50,000. ---25,000--12,000---18,000---2,000-->1,141 Nephrology on board, continue HD prn Renal US; negative for hydronephrosis  Health care associated PNA Currently afebrile, with no leukocytosis Blood cultures X 2, NGTD  Chest x ray left lower lobe infiltrates Continue IV Vanc + IV Aztreonam  Suicidal overdose  Sitter at bedside, has IVC Psych consulted. Recommend inpatient psychiatric admission once stable Started on zyprexa. monitor QTc on EKG Psych recommend increase Zyprexa to 10 mg. Will repeat EKG prior to increase dose. Will hold off for now  Transaminases Trending  down Could be related to muscle injury.  Hepatitis panel ordered, pending   HIV/Hep B ID consulted to change ART meds due to ARF Now on Prezcobix, Epivir, Retrovir  He will need Emergency ADAP program  HIV medications at discharge to be determined, depending on need for HD  Schizoaffective disorder, depressive type ([place]) Started on Zyprexa  Polysubstance abuse Cocaine use, counseled against use   Code Status: Full  Family Communication: None at bedside  Disposition Plan: TBD   Consultants:  ID  Nephrology  Psych   Procedures:  None  Antimicrobials:  IV Vanc  IV Aztreonam   DVT prophylaxis:  Heparin   Objective: Vitals:   02/28/17 1630 02/28/17 1700 02/28/17 1730 02/28/17 1756  BP: (!) 152/78 (!) 156/93 (!) 158/97 (!) 145/82  Pulse: 72 69 73 78  Resp: 18 18 18 18   Temp:   97.8 F (36.6 C) 98.9 F (37.2 C)  TempSrc:   Oral Oral  SpO2:   98% 99%  Weight:   105.6 kg (232 lb 12.9 oz)   Height:        Intake/Output Summary (Last 24 hours) at 02/28/2017 1909 Last data filed at 02/28/2017 1814 Gross per 24 hour  Intake 900 ml  Output 3350 ml  Net -2450 ml   Filed Weights   02/27/17 1959 02/28/17 1315 02/28/17 1730  Weight: 108.8 kg (239 lb 13.8 oz) 108.6 kg (239 lb 6.7 oz) 105.6 kg (232 lb 12.9 oz)    Exam:   General: NAD    Cardiovascular: S1, S2 present  Respiratory: Bilateral crackles at bases   Abdomen: BS present, NT, ND, soft  Musculoskeletal: No  pedal edema noted  Skin: Normal  Psychiatry: Fair mood   Data Reviewed: CBC: Recent Labs  Lab 02/24/17 0031 02/24/17 1231 02/26/17 0808 02/28/17 0600 02/28/17 0844  WBC 10.0 7.7 7.3 5.7 5.6  HGB 11.4* 11.6* 10.3* 10.2* 10.5*  HCT 32.5* 33.3* 29.9* 29.5* 29.7*  MCV 85.3 85.8 86.2 88.1 88.1  PLT 147* 141* 150 142* 235*   Basic Metabolic Panel: Recent Labs  Lab 02/25/17 1005 02/26/17 0550 02/27/17 0449 02/28/17 0600 02/28/17 0844  NA 133* 133* 133* 134* 132*  K 4.2  5.1 4.9 4.4 4.2  CL 91* 91* 95* 94* 95*  CO2 24 21* 21* 24 23  GLUCOSE 117* 93 103* 96 149*  BUN 141* 156* 115* 74* 74*  CREATININE 13.18* 14.99* 12.24* 9.79* 9.85*  CALCIUM 7.4* 7.1* 7.3* 7.8* 7.7*  PHOS 7.9* 9.7* 8.3* 7.4* 7.5*   GFR: Estimated Creatinine Clearance: 11.5 mL/min (A) (by C-G formula based on SCr of 9.85 mg/dL (H)). Liver Function Tests: Recent Labs  Lab 02/22/17 0536  02/24/17 0100  02/25/17 1005 02/26/17 0550 02/26/17 1349 02/27/17 0449 02/28/17 0600 02/28/17 0844  AST 451*  --   --   --  62*  --  45*  --   --   --   ALT 236*  --  173*  --  113*  --  95*  --   --   --   ALKPHOS 60  --   --   --  61  --  69  --   --   --   BILITOT 0.7  --   --   --  0.8  --  0.7  --   --   --   PROT 5.6*  --   --   --  5.8*  --  5.8*  --   --   --   ALBUMIN 2.7*   < >  --    < > 2.6*  2.6* 2.6* 2.6* 2.5* 2.5* 2.5*   < > = values in this interval not displayed.   No results for input(s): LIPASE, AMYLASE in the last 168 hours. No results for input(s): AMMONIA in the last 168 hours. Coagulation Profile: No results for input(s): INR, PROTIME in the last 168 hours. Cardiac Enzymes: Recent Labs  Lab 02/24/17 0549 02/25/17 1005 02/26/17 0550 02/27/17 0449 02/28/17 0600  CKTOTAL 12,577* 57,322* 3,661* 2,092* 1,141*   BNP (last 3 results) No results for input(s): PROBNP in the last 8760 hours. HbA1C: No results for input(s): HGBA1C in the last 72 hours. CBG: No results for input(s): GLUCAP in the last 168 hours. Lipid Profile: No results for input(s): CHOL, HDL, LDLCALC, TRIG, CHOLHDL, LDLDIRECT in the last 72 hours. Thyroid Function Tests: No results for input(s): TSH, T4TOTAL, FREET4, T3FREE, THYROIDAB in the last 72 hours. Anemia Panel: No results for input(s): VITAMINB12, FOLATE, FERRITIN, TIBC, IRON, RETICCTPCT in the last 72 hours. Urine analysis:    Component Value Date/Time   COLORURINE AMBER (A) 02/20/2017 1100   APPEARANCEUR HAZY (A) 02/20/2017 1100    LABSPEC 1.020 02/20/2017 1100   PHURINE 5.0 02/20/2017 1100   GLUCOSEU 50 (A) 02/20/2017 1100   HGBUR LARGE (A) 02/20/2017 1100   BILIRUBINUR NEGATIVE 02/20/2017 1100   KETONESUR NEGATIVE 02/20/2017 1100   PROTEINUR 100 (A) 02/20/2017 1100   NITRITE NEGATIVE 02/20/2017 1100   LEUKOCYTESUR NEGATIVE 02/20/2017 1100   Sepsis Labs: @LABRCNTIP (procalcitonin:4,lacticidven:4)  ) Recent Results (from the past 240 hour(s))  Culture, blood (routine x 2)  Status: None (Preliminary result)   Collection Time: 02/26/17  2:02 PM  Result Value Ref Range Status   Specimen Description BLOOD LEFT ANTECUBITAL  Final   Special Requests   Final    BOTTLES DRAWN AEROBIC AND ANAEROBIC Blood Culture adequate volume   Culture   Final    NO GROWTH 2 DAYS Performed at [facility], [address] [place], [place] [address]    Report Status PENDING  Incomplete  Culture, blood (routine x 2)     Status: None (Preliminary result)   Collection Time: 02/26/17  2:11 PM  Result Value Ref Range Status   Specimen Description BLOOD RIGHT ANTECUBITAL  Final   Special Requests   Final    BOTTLES DRAWN AEROBIC AND ANAEROBIC Blood Culture results may not be optimal due to an excessive volume of blood received in culture bottles   Culture   Final    NO GROWTH 2 DAYS Performed at [facility], [place] [address], [place], [place] [address]    Report Status PENDING  Incomplete      Studies: No results found.  Scheduled Meds: . darunavir-cobicistat  1 tablet Oral Q breakfast  . feeding supplement (PRO-STAT SUGAR FREE 64)  30 mL Oral BID  . heparin injection (subcutaneous)  5,000 Units Subcutaneous Q8H  . lamiVUDine  50 mg Oral Daily  . multivitamin  1 tablet Oral QHS  . OLANZapine  5 mg Oral QHS  . zidovudine  300 mg Oral Daily    Continuous Infusions: . aztreonam 500 mg (02/28/17 1900)  . vancomycin Stopped (02/27/17 1807)     LOS: 9 days     Alma Friendly, MD [facility]  If 7PM-7AM, please contact night-coverage [email or web address] Password [facility] 02/28/2017, 7:09 PM

## 2017-03-01 ENCOUNTER — Inpatient Hospital Stay (HOSPITAL_COMMUNITY): Payer: Self-pay

## 2017-03-01 LAB — PROTEIN / CREATININE RATIO, URINE
CREATININE, URINE: 33.66 mg/dL
Protein Creatinine Ratio: 1.66 mg/mg{Cre} — ABNORMAL HIGH (ref 0.00–0.15)
TOTAL PROTEIN, URINE: 56 mg/dL

## 2017-03-01 LAB — CK: Total CK: 758 U/L — ABNORMAL HIGH (ref 49–397)

## 2017-03-01 LAB — RENAL FUNCTION PANEL
ALBUMIN: 2.5 g/dL — AB (ref 3.5–5.0)
ANION GAP: 15 (ref 5–15)
BUN: 54 mg/dL — ABNORMAL HIGH (ref 6–20)
CALCIUM: 7.8 mg/dL — AB (ref 8.9–10.3)
CO2: 23 mmol/L (ref 22–32)
CREATININE: 8.76 mg/dL — AB (ref 0.61–1.24)
Chloride: 95 mmol/L — ABNORMAL LOW (ref 101–111)
GFR calc Af Amer: 7 mL/min — ABNORMAL LOW (ref >60)
GFR calc non Af Amer: 6 mL/min — ABNORMAL LOW (ref >60)
GLUCOSE: 99 mg/dL (ref 65–99)
Phosphorus: 7.1 mg/dL — ABNORMAL HIGH (ref 2.5–4.6)
Potassium: 4.3 mmol/L (ref 3.5–5.1)
SODIUM: 133 mmol/L — AB (ref 135–145)

## 2017-03-01 LAB — HEPATITIS B SURFACE ANTIGEN

## 2017-03-01 LAB — HEPATITIS B SURFACE AG, CONFIRM: HBsAg Confirmation: POSITIVE — AB

## 2017-03-01 NOTE — Progress Notes (Signed)
PROGRESS NOTE  Dylan [name] BDZ:[id number] DOB: Aug 12, 1965 DOA: 02/19/2017 PCP: Carlyle Basques, MD  HPI/Recap of past 69 hours: 52 year old male with history of HIV, schizophrenia who presented with a 3-week history of suicidal and homicidal ideation. He took a bunch of pills 2 days prior to admission and had been doing cocaine. He felt like he wanted to kill himself and other people. He came to the ER due to sig decreased urine output. In the ED, was found to have rhabdomyolysis with acute renal failure requiring HD which was started on 2/15. On 2/18 spiked a temp, found to have PNA. Psych is recommending inpatient psychiatric facility admission, once medically stable and the need for long term HD.  Today, pt denies any new complaints, still reports suicide ideations, denies homicidal ideations. Denies any chest pain, SOB, fever/chills. Reports improved cough.  Assessment/Plan: Principal Problem:   Schizoaffective disorder, depressive type ([place]) Active Problems:   HIV disease ([place])   Cocaine abuse ([place])   AKI (acute kidney injury) ([place])   Rhabdomyolysis   Suicidal overdose ([place])   Acute hepatitis   Schizoaffective disorder, bipolar type ([place])   Acute renal failure (ARF) ([place])   Suicidal ideations   Chronic 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 coma ([place])  Acute renal failure requiring HD Likely due to Rhabdomyolysis Vs med induced-->ATN  CK above 50,000. ---25,000--12,000---18,000---2,000-->1,141 Nephrology on board, continue HD prn Renal US; negative for hydronephrosis  Health care associated PNA Currently afebrile, with no leukocytosis Blood cultures X 2, NGTD  Chest x ray left lower lobe infiltrates IV Vanc + IV Aztreonam discontinued by ID team  Suicidal overdose  Sitter at bedside, has IVC Psych consulted. Recommend inpatient psychiatric admission once stable Started on zyprexa. monitor QTc on EKG Psych recommend increase Zyprexa to 10 mg. Will  repeat EKG prior to increase dose. Will hold off for now  Transaminases Trended down Could be related to muscle injury.  Hepatitis panel ordered  HIV/Hep B ID consulted to change ART meds due to ARF Now on Prezcobix, Epivir, Retrovir  He will need Emergency ADAP program  HIV medications at discharge to be determined, depending on need for HD  Schizoaffective disorder, depressive type ([place]) Started on Zyprexa  Polysubstance abuse Cocaine use, counseled against use   Code Status: Full  Family Communication: None at bedside  Disposition Plan: TBD   Consultants:  ID  Nephrology  Psych   Procedures:  None  Antimicrobials: None  S/P IV Vanc and IV Aztreonam   DVT prophylaxis:  Heparin   Objective: Vitals:   02/28/17 1730 02/28/17 1756 02/28/17 2035 03/01/17 0500  BP: (!) 158/97 (!) 145/82 (!) 149/75 (!) 142/77  Pulse: 73 78 83 88  Resp: 18 18 17 18   Temp: 97.8 F (36.6 C) 98.9 F (37.2 C) 98.9 F (37.2 C) 99.4 F (37.4 C)  TempSrc: Oral Oral Oral Oral  SpO2: 98% 99% 98% 97%  Weight: 105.6 kg (232 lb 12.9 oz)  105.6 kg (232 lb 12.9 oz) 105.6 kg (232 lb 12.9 oz)  Height:        Intake/Output Summary (Last 24 hours) at 03/01/2017 1617 Last data filed at 03/01/2017 1350 Gross per 24 hour  Intake 1360 ml  Output 3375 ml  Net -2015 ml   Filed Weights   02/28/17 1730 02/28/17 2035 03/01/17 0500  Weight: 105.6 kg (232 lb 12.9 oz) 105.6 kg (232 lb 12.9 oz) 105.6 kg (232 lb 12.9 oz)    Exam:  General: NAD    Cardiovascular: S1, S2 present  Respiratory: Bilateral crackles at bases   Abdomen: BS present, NT, ND, soft  Musculoskeletal: No pedal edema noted  Skin: Normal  Psychiatry: Stable mood   Data Reviewed: CBC: Recent Labs  Lab 02/24/17 0031 02/24/17 1231 02/26/17 0808 02/28/17 0600 02/28/17 0844  WBC 10.0 7.7 7.3 5.7 5.6  HGB 11.4* 11.6* 10.3* 10.2* 10.5*  HCT 32.5* 33.3* 29.9* 29.5* 29.7*  MCV 85.3 85.8 86.2 88.1 88.1    PLT 147* 141* 150 142* 353*   Basic Metabolic Panel: Recent Labs  Lab 02/26/17 0550 02/27/17 0449 02/28/17 0600 02/28/17 0844 03/01/17 0403  NA 133* 133* 134* 132* 133*  K 5.1 4.9 4.4 4.2 4.3  CL 91* 95* 94* 95* 95*  CO2 21* 21* 24 23 23   GLUCOSE 93 103* 96 149* 99  BUN 156* 115* 74* 74* 54*  CREATININE 14.99* 12.24* 9.79* 9.85* 8.76*  CALCIUM 7.1* 7.3* 7.8* 7.7* 7.8*  PHOS 9.7* 8.3* 7.4* 7.5* 7.1*   GFR: Estimated Creatinine Clearance: 12.9 mL/min (A) (by C-G formula based on SCr of 8.76 mg/dL (H)). Liver Function Tests: Recent Labs  Lab 02/24/17 0100  02/25/17 1005  02/26/17 1349 02/27/17 0449 02/28/17 0600 02/28/17 0844 03/01/17 0403  AST  --   --  62*  --  45*  --   --   --   --   ALT 173*  --  113*  --  95*  --   --   --   --   ALKPHOS  --   --  61  --  69  --   --   --   --   BILITOT  --   --  0.8  --  0.7  --   --   --   --   PROT  --   --  5.8*  --  5.8*  --   --   --   --   ALBUMIN  --    < > 2.6*  2.6*   < > 2.6* 2.5* 2.5* 2.5* 2.5*   < > = values in this interval not displayed.   No results for input(s): LIPASE, AMYLASE in the last 168 hours. No results for input(s): AMMONIA in the last 168 hours. Coagulation Profile: No results for input(s): INR, PROTIME in the last 168 hours. Cardiac Enzymes: Recent Labs  Lab 02/25/17 1005 02/26/17 0550 02/27/17 0449 02/28/17 0600 03/01/17 0403  CKTOTAL 18,548* 3,661* 2,092* 1,141* 758*   BNP (last 3 results) No results for input(s): PROBNP in the last 8760 hours. HbA1C: No results for input(s): HGBA1C in the last 72 hours. CBG: No results for input(s): GLUCAP in the last 168 hours. Lipid Profile: No results for input(s): CHOL, HDL, LDLCALC, TRIG, CHOLHDL, LDLDIRECT in the last 72 hours. Thyroid Function Tests: No results for input(s): TSH, T4TOTAL, FREET4, T3FREE, THYROIDAB in the last 72 hours. Anemia Panel: No results for input(s): VITAMINB12, FOLATE, FERRITIN, TIBC, IRON, RETICCTPCT in the last 72  hours. Urine analysis:    Component Value Date/Time   COLORURINE AMBER (A) 02/20/2017 1100   APPEARANCEUR HAZY (A) 02/20/2017 1100   LABSPEC 1.020 02/20/2017 1100   PHURINE 5.0 02/20/2017 1100   GLUCOSEU 50 (A) 02/20/2017 1100   HGBUR LARGE (A) 02/20/2017 1100   BILIRUBINUR NEGATIVE 02/20/2017 1100   KETONESUR NEGATIVE 02/20/2017 1100   PROTEINUR 100 (A) 02/20/2017 1100   NITRITE NEGATIVE 02/20/2017 1100   LEUKOCYTESUR NEGATIVE 02/20/2017 1100  Sepsis Labs: @LABRCNTIP (procalcitonin:4,lacticidven:4)  ) Recent Results (from the past 240 hour(s))  Culture, blood (routine x 2)     Status: None (Preliminary result)   Collection Time: 02/26/17  2:02 PM  Result Value Ref Range Status   Specimen Description BLOOD LEFT ANTECUBITAL  Final   Special Requests   Final    BOTTLES DRAWN AEROBIC AND ANAEROBIC Blood Culture adequate volume   Culture   Final    NO GROWTH 2 DAYS Performed at [facility], [address] [place], [place] [address]    Report Status PENDING  Incomplete  Culture, blood (routine x 2)     Status: None (Preliminary result)   Collection Time: 02/26/17  2:11 PM  Result Value Ref Range Status   Specimen Description BLOOD RIGHT ANTECUBITAL  Final   Special Requests   Final    BOTTLES DRAWN AEROBIC AND ANAEROBIC Blood Culture results may not be optimal due to an excessive volume of blood received in culture bottles   Culture   Final    NO GROWTH 2 DAYS Performed at [facility], [place] [address], [place], [place] [address]    Report Status PENDING  Incomplete      Studies: Dg Chest Port 1 View  Result Date: 03/01/2017 CLINICAL DATA:  52 year old male with renal failure, shortness of breath, HIV. EXAM: PORTABLE CHEST 1 VIEW COMPARISON:  02/26/2017 and earlier. FINDINGS: Portable AP semi upright view at 0938 hrs. Stable right IJ approach dialysis type catheter. Improved lung volumes since 02/26/2017. Regressed pulmonary vascularity. Improved bibasilar  ventilation. No pneumothorax, pulmonary edema, pleural effusion or confluent pulmonary opacity identified. Stable borderline to mild cardiomegaly. Other mediastinal contours remain normal. Visualized tracheal air column is within normal limits. Paucity bowel gas in the upper abdomen. IMPRESSION: 1. Resolved interstitial edema and lung base hypoventilation since 02/26/2017. 2.  No acute cardiopulmonary abnormality. Electronically Signed   By: Genevie Ann M.D.   On: 03/01/2017 10:06    Scheduled Meds: . darunavir-cobicistat  1 tablet Oral Q breakfast  . feeding supplement (PRO-STAT SUGAR FREE 64)  30 mL Oral BID  . heparin injection (subcutaneous)  5,000 Units Subcutaneous Q8H  . lamiVUDine  50 mg Oral Daily  . multivitamin  1 tablet Oral QHS  . OLANZapine  5 mg Oral QHS  . zidovudine  300 mg Oral Daily    Continuous Infusions:    LOS: 10 days     Alma Friendly, MD [facility]  If 7PM-7AM, please contact night-coverage [email or web address] Password [facility] 03/01/2017, 4:17 PM

## 2017-03-01 NOTE — Progress Notes (Signed)
[facility] for Infectious Disease    Date of Admission:  02/19/2017   Total days of antibiotics 3          ID: Dylan [name] is a 52 y.o. male with  52 year old male with history of HIV, schizophrenia who presented with a 3-week history of suicidal and homicidal ideation. He took a bunch of pills 2 days prior to admission and had been doing cocaine. He felt like he wanted to kill himself and other people. He came to the ER due to sig decreased urine output. In the ED, was found to have rhabdomyolysis with acute renal failure requiring HD which was started on 2/15. On 2/18 spiked a temp, isolated but cxr found infiltrate and dx as pneumonia.   Principal Problem:   Schizoaffective disorder, depressive type ([place]) Active Problems:   HIV disease ([place])   Cocaine abuse ([place])   AKI (acute kidney injury) ([place])   Rhabdomyolysis   Suicidal overdose ([facility])   Acute hepatitis   Schizoaffective disorder, bipolar type ([place])   Acute renal failure (ARF) ([place])   Suicidal ideations   Chronic 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 coma ([place])    Subjective: Afebrile, denies cough shortness of breath.  Medications:  . darunavir-cobicistat  1 tablet Oral Q breakfast  . feeding supplement (PRO-STAT SUGAR FREE 64)  30 mL Oral BID  . heparin injection (subcutaneous)  5,000 Units Subcutaneous Q8H  . lamiVUDine  50 mg Oral Daily  . multivitamin  1 tablet Oral QHS  . OLANZapine  5 mg Oral QHS  . zidovudine  300 mg Oral Daily    Objective: Vital signs in last 24 hours: Temp:  [98.9 F (37.2 C)-99.4 F (37.4 C)] 99.4 F (37.4 C) (02/21 0500) Pulse Rate:  [78-88] 88 (02/21 0500) Resp:  [17-18] 18 (02/21 0500) BP: (142-149)/(75-82) 142/77 (02/21 0500) SpO2:  [97 %-99 %] 97 % (02/21 0500) Weight:  [232 lb 12.9 oz (105.6 kg)] 232 lb 12.9 oz (105.6 kg) (02/21 0500) Physical Exam  Constitutional: He is oriented to person, place, and time. He appears well-developed and  well-nourished. No distress.  HENT:  Mouth/Throat: Oropharynx is clear and moist. No oropharyngeal exudate.  Nekc:right sided HD catheter Cardiovascular: Normal rate, regular rhythm and normal heart sounds. Exam reveals no gallop and no friction rub.  No murmur heard.  Pulmonary/Chest: Effort normal and breath sounds normal. No respiratory distress. He has no wheezes.  Abdominal: Soft. Bowel sounds are normal. He exhibits no distension. There is no tenderness.  Lymphadenopathy:  He has no cervical adenopathy.  Neurological: He is alert and oriented to person, place, and time.  Skin: Skin is warm and dry. No rash noted. No erythema.  Psychiatric: He has a normal mood and affect. His behavior is normal.     Lab Results Recent Labs    02/28/17 0600 02/28/17 0844 03/01/17 0403  WBC 5.7 5.6  --   HGB 10.2* 10.5*  --   HCT 29.5* 29.7*  --   NA 134* 132* 133*  K 4.4 4.2 4.3  CL 94* 95* 95*  CO2 24 23 23   BUN 74* 74* 54*  CREATININE 9.79* 9.85* 8.76*   Liver Panel Recent Labs    02/28/17 0844 03/01/17 0403  ALBUMIN 2.5* 2.5*    Studies/Results: Dg Chest Port 1 View  Result Date: 03/01/2017 CLINICAL DATA:  52 year old male with renal failure, shortness of breath, HIV. EXAM: PORTABLE CHEST 1 VIEW COMPARISON:  02/26/2017 and  earlier. FINDINGS: Portable AP semi upright view at 0938 hrs. Stable right IJ approach dialysis type catheter. Improved lung volumes since 02/26/2017. Regressed pulmonary vascularity. Improved bibasilar ventilation. No pneumothorax, pulmonary edema, pleural effusion or confluent pulmonary opacity identified. Stable borderline to mild cardiomegaly. Other mediastinal contours remain normal. Visualized tracheal air column is within normal limits. Paucity bowel gas in the upper abdomen. IMPRESSION: 1. Resolved interstitial edema and lung base hypoventilation since 02/26/2017. 2.  No acute cardiopulmonary abnormality. Electronically Signed   By: Genevie Ann M.D.   On:  03/01/2017 10:06     Assessment/Plan: AKI = now on hemodialysis, hopefully will improve. Appreciate renal input/management  HIV disease= he is on a new regimen that is adjusted for his renal function. Continue with new regimen. Otherwise he is well controlled  Infiltrate on cxr = now resolved. I suspect this was pulmonary edema and not pneumonia. He has received 3 days of treatment and now improved. Will discontinue and minimize risk for cdifficile  history of schizoaffective disorder, Cocaine use disorder, recent overdose on medications and reports suicidal and homicidal ideation = once stabilized would see if he would benefit from inpatient psychiatry. Continue with zyprexa 10mg  qhs.      [facility] for Infectious Diseases Cell: [phone] Pager: [phone]  03/01/2017, 5:52 PM

## 2017-03-01 NOTE — Progress Notes (Addendum)
[facility] Progress Note   Subjective: Looks more alert, says he slept well last night. More interactive today. No new C/Os.     Objective Vitals:   02/28/17 1730 02/28/17 1756 02/28/17 2035 03/01/17 0500  BP: (!) 158/97 (!) 145/82 (!) 149/75 (!) 142/77  Pulse: 73 78 83 88  Resp: 18 18 17 18   Temp: 97.8 F (36.6 C) 98.9 F (37.2 C) 98.9 F (37.2 C) 99.4 F (37.4 C)  TempSrc: Oral Oral Oral Oral  SpO2: 98% 99% 98% 97%  Weight: 105.6 kg (232 lb 12.9 oz)  105.6 kg (232 lb 12.9 oz) 105.6 kg (232 lb 12.9 oz)  Height:       Physical Exam General: WN WD NAD Heart:S1,S2, RRR Lungs: CTAB A/P Abdomen: Active BS Extremities: No LE edema Dialysis Access: RIJ TDC drsg CDI.    Additional Objective Labs: Basic Metabolic Panel: Recent Labs  Lab 02/28/17 0600 02/28/17 0844 03/01/17 0403  NA 134* 132* 133*  K 4.4 4.2 4.3  CL 94* 95* 95*  CO2 24 23 23   GLUCOSE 96 149* 99  BUN 74* 74* 54*  CREATININE 9.79* 9.85* 8.76*  CALCIUM 7.8* 7.7* 7.8*  PHOS 7.4* 7.5* 7.1*   Liver Function Tests: Recent Labs  Lab 02/24/17 0100  02/25/17 1005  02/26/17 1349  02/28/17 0600 02/28/17 0844 03/01/17 0403  AST  --   --  62*  --  45*  --   --   --   --   ALT 173*  --  113*  --  95*  --   --   --   --   ALKPHOS  --   --  61  --  69  --   --   --   --   BILITOT  --   --  0.8  --  0.7  --   --   --   --   PROT  --   --  5.8*  --  5.8*  --   --   --   --   ALBUMIN  --    < > 2.6*  2.6*   < > 2.6*   < > 2.5* 2.5* 2.5*   < > = values in this interval not displayed.   No results for input(s): LIPASE, AMYLASE in the last 168 hours. CBC: Recent Labs  Lab 02/24/17 0031 02/24/17 1231 02/26/17 0808 02/28/17 0600 02/28/17 0844  WBC 10.0 7.7 7.3 5.7 5.6  HGB 11.4* 11.6* 10.3* 10.2* 10.5*  HCT 32.5* 33.3* 29.9* 29.5* 29.7*  MCV 85.3 85.8 86.2 88.1 88.1  PLT 147* 141* 150 142* 145*   Blood Culture    Component Value Date/Time   SDES BLOOD RIGHT ANTECUBITAL 02/26/2017  1411   SPECREQUEST  02/26/2017 1411    BOTTLES DRAWN AEROBIC AND ANAEROBIC Blood Culture results may not be optimal due to an excessive volume of blood received in culture bottles   CULT  02/26/2017 1411    NO GROWTH 2 DAYS Performed at [facility], [place] [address] [place], [place] [address]    REPTSTATUS PENDING 02/26/2017 1411    Cardiac Enzymes: Recent Labs  Lab 02/25/17 1005 02/26/17 0550 02/27/17 0449 02/28/17 0600 03/01/17 0403  CKTOTAL 18,548* 3,661* 2,092* 1,141* 758*   CBG: No results for input(s): GLUCAP in the last 168 hours. Iron Studies: No results for input(s): IRON, TIBC, TRANSFERRIN, FERRITIN in the last 72 hours. @lablastinr3 @ Studies/Results: No results found. Medications: . aztreonam 500  mg (03/01/17 0542)  . vancomycin Stopped (02/27/17 1807)   . darunavir-cobicistat  1 tablet Oral Q breakfast  . feeding supplement (PRO-STAT SUGAR FREE 64)  30 mL Oral BID  . heparin injection (subcutaneous)  5,000 Units Subcutaneous Q8H  . lamiVUDine  50 mg Oral Daily  . multivitamin  1 tablet Oral QHS  . OLANZapine  5 mg Oral QHS  . zidovudine  300 mg Oral Daily     Assessment/Plan: 1.AKI 2/2 Rhabo admitting CK >50000 CK total 1141 today. 1st HD 02/23/17. CXR 02/26/17 showed mild edema. HD 02/27/17 Pre wt 111.5 Net UF 2.0 liters Post wt 108.6. Has had serial HD now for 3 days, repeat CXR today. Scr down to 8.76 BUN 54 Next HD 03/02/17.  Avoid volume depletion as this will hinder recovery from AKI. No clear suggestion of underlying glomerular disease, such as HIV nephropathy.  UA protein is only 100, and [place] in unremarkable w/ normal sized kidneys and normal echotexture.  Will get urine prot:creat ratio.  Otherwise we are attributing renal failure to rhabdo w/ assoc ATN.   2. HCAP;CXR with LLL infiltrates. BC NG X 24 hrs. Tmax 99.4 yesterday. Vanc/Aztreonam per primary.  3. Anemia -HGB 10.5  Follow trend. 4. Secondary hyperparathyroidism -Ca 7.8 C Ca 9 Phos  7.1 Monitor labs. No Ca products in acute phase of heme pigment AKI except for correction of hyperkalemia.  5.HIV: ID consulted. Prezcobix, Epivir and Retrovir. Per primary 6. Nutrition -Albumin 2.6. Change to renal diet, add prostat, renal vit.  7. PNA: On Vanc/ Aztreonam per primary.  8. Schizoaffective disorder. On Zyprexa.  9. Suicide Attempt/OD-IVD. Psych consult.Holding transfer to psych facility until medically stable.    Rita H. Brown NP-C 03/01/2017, 9:16 AM  [facility] [phone]  Pt seen, examined and agree w A/P as above. Making more urine but has not all been recorded.  Will hold off on HD today and tomorrow, see if any signs of renal recovery.   Kelly Splinter MD [employer] pager [phone]   03/01/2017, 1:04 PM

## 2017-03-02 LAB — RENAL FUNCTION PANEL
ANION GAP: 17 — AB (ref 5–15)
Albumin: 2.5 g/dL — ABNORMAL LOW (ref 3.5–5.0)
BUN: 74 mg/dL — ABNORMAL HIGH (ref 6–20)
CALCIUM: 7.9 mg/dL — AB (ref 8.9–10.3)
CO2: 21 mmol/L — AB (ref 22–32)
Chloride: 95 mmol/L — ABNORMAL LOW (ref 101–111)
Creatinine, Ser: 11.15 mg/dL — ABNORMAL HIGH (ref 0.61–1.24)
GFR calc non Af Amer: 5 mL/min — ABNORMAL LOW (ref >60)
GFR, EST AFRICAN AMERICAN: 5 mL/min — AB (ref >60)
Glucose, Bld: 96 mg/dL (ref 65–99)
PHOSPHORUS: 9.1 mg/dL — AB (ref 2.5–4.6)
Potassium: 4.4 mmol/L (ref 3.5–5.1)
SODIUM: 133 mmol/L — AB (ref 135–145)

## 2017-03-02 LAB — CK: CK TOTAL: 565 U/L — AB (ref 49–397)

## 2017-03-02 MED ORDER — FERRIC CITRATE 1 GM 210 MG(FE) PO TABS
420.0000 mg | ORAL_TABLET | Freq: Three times a day (TID) | ORAL | Status: DC
  Administered 2017-03-02 – 2017-03-05 (×8): 420 mg via ORAL
  Filled 2017-03-02 (×12): qty 2

## 2017-03-02 MED ORDER — DM-GUAIFENESIN ER 30-600 MG PO TB12
1.0000 | ORAL_TABLET | Freq: Two times a day (BID) | ORAL | Status: DC | PRN
  Administered 2017-03-02: 1 via ORAL
  Filled 2017-03-02 (×4): qty 1

## 2017-03-02 NOTE — Progress Notes (Signed)
PROGRESS NOTE  Dylan [name] QBH:[id number] DOB: Sep 02, 1965 DOA: 02/19/2017 PCP: Carlyle Basques, MD  HPI/Recap of past 82 hours: 52 year old male with history of HIV, schizophrenia who presented with a 3-week history of suicidal and homicidal ideation. He took a bunch of pills 2 days prior to admission and had been doing cocaine. He felt like he wanted to kill himself and other people. He came to the ER due to sig decreased urine output. In the ED, was found to have rhabdomyolysis with acute renal failure requiring HD which was started on 2/15. On 2/18 spiked a temp, found to have PNA. Psych is recommending inpatient psychiatric facility admission, once medically stable and the need for long term HD.  Today, pt denies any new complaints, denies chest pain, SOB, cough, fever/chills. Reports feeling better.  Assessment/Plan: Principal Problem:   Schizoaffective disorder, depressive type ([place]) Active Problems:   HIV disease ([place])   Cocaine abuse ([place])   AKI (acute kidney injury) ([place])   Rhabdomyolysis   Suicidal overdose ([place])   Acute hepatitis   Schizoaffective disorder, bipolar type ([place])   Acute renal failure (ARF) ([place])   Suicidal ideations   Chronic 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 coma ([place])  Acute renal failure requiring HD Likely due to Rhabdomyolysis Vs med induced-->ATN  CK above 50,000. ---25,000--12,000---18,000---2,000-->1,141 Nephrology on board, continue HD prn Renal US; negative for hydronephrosis  Health care associated PNA Currently afebrile, with no leukocytosis Blood cultures X 2, NGTD  Chest x ray ??left lower lobe infiltrates IV Vanc + IV Aztreonam discontinued by ID team  Suicidal overdose  Sitter at bedside, has IVC Psych consulted. Recommend inpatient psychiatric admission once stable Started on zyprexa. monitor QTc on EKG Psych recommend increase Zyprexa to 10 mg. Will repeat EKG prior to increase dose. Will hold off for  now  Transaminases Trended down Could be related to muscle injury.  Hepatitis panel ordered  HIV/Hep B ID consulted to change ART meds due to ARF Now on Prezcobix, Epivir, Retrovir  He will need Emergency ADAP program  HIV medications at discharge to be determined, depending on need for HD  Schizoaffective disorder, depressive type ([place]) Started on Zyprexa  Polysubstance abuse Cocaine use, counseled against use   Code Status: Full  Family Communication: None at bedside  Disposition Plan: TBD   Consultants:  ID  Nephrology  Psych   Procedures:  None  Antimicrobials: None  S/P IV Vanc and IV Aztreonam   DVT prophylaxis:  Heparin   Objective: Vitals:   03/01/17 2053 03/02/17 0413 03/02/17 0630 03/02/17 0942  BP: (!) 152/82  (!) 142/71 137/77  Pulse: 76  80 79  Resp: 20  19 18   Temp: 98.5 F (36.9 C)  98.4 F (36.9 C) 98.8 F (37.1 C)  TempSrc: Oral  Oral Oral  SpO2: 100%  100% 96%  Weight: 105.2 kg (231 lb 14.8 oz) 105.2 kg (231 lb 14.8 oz)    Height:        Intake/Output Summary (Last 24 hours) at 03/02/2017 1630 Last data filed at 03/02/2017 0900 Gross per 24 hour  Intake 1004 ml  Output 1300 ml  Net -296 ml   Filed Weights   03/01/17 0500 03/01/17 2053 03/02/17 0413  Weight: 105.6 kg (232 lb 12.9 oz) 105.2 kg (231 lb 14.8 oz) 105.2 kg (231 lb 14.8 oz)    Exam:   General: NAD    Cardiovascular: S1, S2 present  Respiratory: CTA  Abdomen: BS present,  NT, ND, soft  Musculoskeletal: No pedal edema noted  Skin: Normal  Psychiatry: Stable mood   Data Reviewed: CBC: Recent Labs  Lab 02/24/17 0031 02/24/17 1231 02/26/17 0808 02/28/17 0600 02/28/17 0844  WBC 10.0 7.7 7.3 5.7 5.6  HGB 11.4* 11.6* 10.3* 10.2* 10.5*  HCT 32.5* 33.3* 29.9* 29.5* 29.7*  MCV 85.3 85.8 86.2 88.1 88.1  PLT 147* 141* 150 142* 656*   Basic Metabolic Panel: Recent Labs  Lab 02/27/17 0449 02/28/17 0600 02/28/17 0844 03/01/17 0403  03/02/17 0524  NA 133* 134* 132* 133* 133*  K 4.9 4.4 4.2 4.3 4.4  CL 95* 94* 95* 95* 95*  CO2 21* 24 23 23  21*  GLUCOSE 103* 96 149* 99 96  BUN 115* 74* 74* 54* 74*  CREATININE 12.24* 9.79* 9.85* 8.76* 11.15*  CALCIUM 7.3* 7.8* 7.7* 7.8* 7.9*  PHOS 8.3* 7.4* 7.5* 7.1* 9.1*   GFR: Estimated Creatinine Clearance: 10.1 mL/min (A) (by C-G formula based on SCr of 11.15 mg/dL (H)). Liver Function Tests: Recent Labs  Lab 02/24/17 0100  02/25/17 1005  02/26/17 1349 02/27/17 0449 02/28/17 0600 02/28/17 0844 03/01/17 0403 03/02/17 0524  AST  --   --  62*  --  45*  --   --   --   --   --   ALT 173*  --  113*  --  95*  --   --   --   --   --   ALKPHOS  --   --  61  --  69  --   --   --   --   --   BILITOT  --   --  0.8  --  0.7  --   --   --   --   --   PROT  --   --  5.8*  --  5.8*  --   --   --   --   --   ALBUMIN  --    < > 2.6*  2.6*   < > 2.6* 2.5* 2.5* 2.5* 2.5* 2.5*   < > = values in this interval not displayed.   No results for input(s): LIPASE, AMYLASE in the last 168 hours. No results for input(s): AMMONIA in the last 168 hours. Coagulation Profile: No results for input(s): INR, PROTIME in the last 168 hours. Cardiac Enzymes: Recent Labs  Lab 02/26/17 0550 02/27/17 0449 02/28/17 0600 03/01/17 0403 03/02/17 0524  CKTOTAL 3,661* 2,092* 1,141* 758* 565*   BNP (last 3 results) No results for input(s): PROBNP in the last 8760 hours. HbA1C: No results for input(s): HGBA1C in the last 72 hours. CBG: No results for input(s): GLUCAP in the last 168 hours. Lipid Profile: No results for input(s): CHOL, HDL, LDLCALC, TRIG, CHOLHDL, LDLDIRECT in the last 72 hours. Thyroid Function Tests: No results for input(s): TSH, T4TOTAL, FREET4, T3FREE, THYROIDAB in the last 72 hours. Anemia Panel: No results for input(s): VITAMINB12, FOLATE, FERRITIN, TIBC, IRON, RETICCTPCT in the last 72 hours. Urine analysis:    Component Value Date/Time   COLORURINE AMBER (A) 02/20/2017 1100    APPEARANCEUR HAZY (A) 02/20/2017 1100   LABSPEC 1.020 02/20/2017 1100   PHURINE 5.0 02/20/2017 1100   GLUCOSEU 50 (A) 02/20/2017 1100   HGBUR LARGE (A) 02/20/2017 1100   BILIRUBINUR NEGATIVE 02/20/2017 1100   KETONESUR NEGATIVE 02/20/2017 1100   PROTEINUR 100 (A) 02/20/2017 1100   NITRITE NEGATIVE 02/20/2017 1100   LEUKOCYTESUR NEGATIVE 02/20/2017 1100   Sepsis Labs: @  LABRCNTIP(procalcitonin:4,lacticidven:4)  ) Recent Results (from the past 240 hour(s))  Culture, blood (routine x 2)     Status: None (Preliminary result)   Collection Time: 02/26/17  2:02 PM  Result Value Ref Range Status   Specimen Description BLOOD LEFT ANTECUBITAL  Final   Special Requests   Final    BOTTLES DRAWN AEROBIC AND ANAEROBIC Blood Culture adequate volume   Culture   Final    NO GROWTH 4 DAYS Performed at [facility], [address], [place], [place] [address]    Report Status PENDING  Incomplete  Culture, blood (routine x 2)     Status: None (Preliminary result)   Collection Time: 02/26/17  2:11 PM  Result Value Ref Range Status   Specimen Description BLOOD RIGHT ANTECUBITAL  Final   Special Requests   Final    BOTTLES DRAWN AEROBIC AND ANAEROBIC Blood Culture results may not be optimal due to an excessive volume of blood received in culture bottles   Culture   Final    NO GROWTH 4 DAYS Performed at [facility], [place] [address], [place], [place] [address]    Report Status PENDING  Incomplete      Studies: No results found.  Scheduled Meds: . darunavir-cobicistat  1 tablet Oral Q breakfast  . feeding supplement (PRO-STAT SUGAR FREE 64)  30 mL Oral BID  . ferric citrate  420 mg Oral TID WC  . heparin injection (subcutaneous)  5,000 Units Subcutaneous Q8H  . lamiVUDine  50 mg Oral Daily  . multivitamin  1 tablet Oral QHS  . OLANZapine  5 mg Oral QHS  . zidovudine  300 mg Oral Daily    Continuous Infusions:    LOS: 11 days     Alma Friendly, MD [facility]  If 7PM-7AM, please contact night-coverage [email or web address] Password [facility] 03/02/2017, 4:30 PM

## 2017-03-02 NOTE — Clinical Social Work Note (Signed)
Officer Mali Genzlinger and another Engineer, manufacturing systems visited hospital and talked with nurse regarding patient. Per Chief Technology Officer, patient is on probation and they have to follow him through his hospitalization and psychiatric stay. Requested to be informed when patient discharges. He will send CSW information via e-mail and his phone number is [phone].  Aletheia Tangredi Givens, MSW, LCSW Licensed Clinical Social Worker [place] [phone]

## 2017-03-02 NOTE — Progress Notes (Addendum)
Subjective:   Trying to eat  lunch complains some nausea   Objective Vital signs in last 24 hours: Vitals:   03/01/17 2053 03/02/17 0413 03/02/17 0630 03/02/17 0942  BP: (!) 152/82  (!) 142/71 137/77  Pulse: 76  80 79  Resp: 20  19 18   Temp: 98.5 F (36.9 C)  98.4 F (36.9 C) 98.8 F (37.1 C)  TempSrc: Oral  Oral Oral  SpO2: 100%  100% 96%  Weight: 105.2 kg (231 lb 14.8 oz) 105.2 kg (231 lb 14.8 oz)    Height:       Weight change: -3.4 kg (-7.9 oz)  Physical Exam: General: Alert AAM NAD , Eating some lunch Heat= RRR, no rub or mur. Lungs: CTAB A/P Abdomen: Active BS Extremities: No LE edema Dialysis Access: RIJ TDC drsg CDI.   Problem/Plan: 1.AKI 2/2 [facility] admitting CK >50000CK total 1141today. 1st HD 02/23/17. CXR 02/26/17 showed mild edema. HD 02/27/17 Pre wt 111.5 Net UF 2.0 liters Post wt 108.6. Has had serial HD now for 3 days, repeat CXR yesterday = Resolution of Pul edema . Scr down to 8.76 BUN 54 /   Today  500 cc Urine outpt. BUT Scr back up to 11.15. Will need HD tomorrow most likely, orders written.  2. HCAP;CXR with LLL infiltrates.  Vanc/Aztreonam per primary. 3.Anemia -HGB 10.5  Follow trend. 4. Secondary hyperparathyroidism -Ca 7.8 C Ca 9 Phos 7.1 . Phos 9.1 today Corec Ca 10.7  Needs phos binder start Auryxia  1 ac  5.HIV: ID consulted. Prezcobix, Epivir and Retrovir. Per primary 6. Nutrition -Albumin 2.6.> 2.5  Change to renal diet, add prostat, renal vit. 7. PNA: On Vanc/ Aztreonam per primary. 8. Schizoaffective disorder. On Zyprexa. 9.Suicide Attempt/OD-IVD. Psych consult./ sitter Holding transfer to psych facility until medically stable.   Ernest Haber, PA-C [facility] [phone] 03/02/2017,1:04 PM  LOS: 11 days   Pt seen, examined, agree w assess/plan as above with additions as indicated.  Kelly Splinter MD [facility] pager [phone]    cell [phone] 03/02/2017, 1:52 PM     Labs: Basic  Metabolic Panel: Recent Labs  Lab 02/28/17 0844 03/01/17 0403 03/02/17 0524  NA 132* 133* 133*  K 4.2 4.3 4.4  CL 95* 95* 95*  CO2 23 23 21*  GLUCOSE 149* 99 96  BUN 74* 54* 74*  CREATININE 9.85* 8.76* 11.15*  CALCIUM 7.7* 7.8* 7.9*  PHOS 7.5* 7.1* 9.1*   Liver Function Tests: Recent Labs  Lab 02/24/17 0100  02/25/17 1005  02/26/17 1349  02/28/17 0844 03/01/17 0403 03/02/17 0524  AST  --   --  62*  --  45*  --   --   --   --   ALT 173*  --  113*  --  95*  --   --   --   --   ALKPHOS  --   --  61  --  69  --   --   --   --   BILITOT  --   --  0.8  --  0.7  --   --   --   --   PROT  --   --  5.8*  --  5.8*  --   --   --   --   ALBUMIN  --    < > 2.6*  2.6*   < > 2.6*   < > 2.5* 2.5* 2.5*   < > = values in this interval  not displayed.   No results for input(s): LIPASE, AMYLASE in the last 168 hours. No results for input(s): AMMONIA in the last 168 hours. CBC: Recent Labs  Lab 02/24/17 0031 02/24/17 1231 02/26/17 0808 02/28/17 0600 02/28/17 0844  WBC 10.0 7.7 7.3 5.7 5.6  HGB 11.4* 11.6* 10.3* 10.2* 10.5*  HCT 32.5* 33.3* 29.9* 29.5* 29.7*  MCV 85.3 85.8 86.2 88.1 88.1  PLT 147* 141* 150 142* 145*   Cardiac Enzymes: Recent Labs  Lab 02/26/17 0550 02/27/17 0449 02/28/17 0600 03/01/17 0403 03/02/17 0524  CKTOTAL 3,661* 2,092* 1,141* 758* 565*    Medications:  . darunavir-cobicistat  1 tablet Oral Q breakfast  . feeding supplement (PRO-STAT SUGAR FREE 64)  30 mL Oral BID  . heparin injection (subcutaneous)  5,000 Units Subcutaneous Q8H  . lamiVUDine  50 mg Oral Daily  . multivitamin  1 tablet Oral QHS  . OLANZapine  5 mg Oral QHS  . zidovudine  300 mg Oral Daily

## 2017-03-02 NOTE — Progress Notes (Signed)
[facility] for Infectious Disease    Date of Admission:  02/19/2017   Total days of antibiotics 3          ID: Dylan [name] is a 52 y.o. male with  52 year old male with history of HIV, schizophrenia who presented with a 3-week history of suicidal and homicidal ideation. He took a bunch of pills 2 days prior to admission and had been doing cocaine. He felt like he wanted to kill himself and other people. He came to the ER due to sig decreased urine output. In the ED, was found to have rhabdomyolysis with acute renal failure requiring HD which was started on 2/15. On 2/18 spiked a temp, isolated but cxr found infiltrate and dx as pneumonia.   Principal Problem:   Schizoaffective disorder, depressive type ([place]) Active Problems:   HIV disease ([place])   Cocaine abuse ([place])   AKI (acute kidney injury) ([place])   Rhabdomyolysis   Suicidal overdose ([facility])   Acute hepatitis   Schizoaffective disorder, bipolar type ([place])   Acute renal failure (ARF) ([place])   Suicidal ideations   Chronic 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 coma ([place])    Subjective: Afebrile and feeling "up and down." Walked the hall today and tells me he just voided. He has a dry cough that keeps him up at night a little. Denies chest pain, shortness of breath or fevers/chills. Some mild diarrhea today once this morning but none since. Mostly normal appetite.   CK down to 565  Medications:  . darunavir-cobicistat  1 tablet Oral Q breakfast  . feeding supplement (PRO-STAT SUGAR FREE 64)  30 mL Oral BID  . ferric citrate  420 mg Oral TID WC  . heparin injection (subcutaneous)  5,000 Units Subcutaneous Q8H  . lamiVUDine  50 mg Oral Daily  . multivitamin  1 tablet Oral QHS  . OLANZapine  5 mg Oral QHS  . zidovudine  300 mg Oral Daily    Objective: Vital signs in last 24 hours: Temp:  [98.4 F (36.9 C)-98.8 F (37.1 C)] 98.8 F (37.1 C) (02/22 0942) Pulse Rate:  [76-80] 79 (02/22 0942) Resp:   [18-20] 18 (02/22 0942) BP: (137-152)/(71-82) 137/77 (02/22 0942) SpO2:  [96 %-100 %] 96 % (02/22 0942) Weight:  [231 lb 14.8 oz (105.2 kg)] 231 lb 14.8 oz (105.2 kg) (02/22 0413) Physical Exam  Constitutional: He is oriented to person, place, and time. He appears well-developed and well-nourished. No distress.  HENT:  Mouth/Throat: Oropharynx is clear and moist. No oropharyngeal exudate.  Nekc:right sided HD catheter Cardiovascular: Normal rate, regular rhythm and normal heart sounds. Exam reveals no gallop and no friction rub.  No murmur heard.  Pulmonary/Chest: Effort normal and breath sounds normal. No respiratory distress. Slight expiratory wheeze on posterior assessment.  Abdominal: Soft. Bowel sounds are normal. He exhibits no distension. There is no tenderness.  Lymphadenopathy: He has no cervical adenopathy.  Neurological: He is alert and oriented to person, place, and time.  Skin: Skin is warm and dry. No rash noted. No erythema.  Psychiatric: He has a normal mood and affect. His behavior is normal.    Lab Results Recent Labs    02/28/17 0600 02/28/17 0844 03/01/17 0403 03/02/17 0524  WBC 5.7 5.6  --   --   HGB 10.2* 10.5*  --   --   HCT 29.5* 29.7*  --   --   NA 134* 132* 133* 133*  K 4.4 4.2  4.3 4.4  CL 94* 95* 95* 95*  CO2 24 23 23  21*  BUN 74* 74* 54* 74*  CREATININE 9.79* 9.85* 8.76* 11.15*   Liver Panel Recent Labs    03/01/17 0403 03/02/17 0524  ALBUMIN 2.5* 2.5*    Studies/Results: Dg Chest Port 1 View  Result Date: 03/01/2017 CLINICAL DATA:  52 year old male with renal failure, shortness of breath, HIV. EXAM: PORTABLE CHEST 1 VIEW COMPARISON:  02/26/2017 and earlier. FINDINGS: Portable AP semi upright view at 0938 hrs. Stable right IJ approach dialysis type catheter. Improved lung volumes since 02/26/2017. Regressed pulmonary vascularity. Improved bibasilar ventilation. No pneumothorax, pulmonary edema, pleural effusion or confluent pulmonary opacity  identified. Stable borderline to mild cardiomegaly. Other mediastinal contours remain normal. Visualized tracheal air column is within normal limits. Paucity bowel gas in the upper abdomen. IMPRESSION: 1. Resolved interstitial edema and lung base hypoventilation since 02/26/2017. 2.  No acute cardiopulmonary abnormality. Electronically Signed   By: Genevie Ann M.D.   On: 03/01/2017 10:06     Assessment/Plan: AKI/rhabdomyolosis = Creatinine back up to 11 again - now on hemodialysis, hopefully will improve as he had no evidence of prior CKD on ultrasound. Appreciate renal input/management.   HIV disease= he is on a new regimen that is adjusted for his renal function. Continue with new regimen. Otherwise he is well controlled  Cough = reports a mild non-productive cough and nasal congestion with some sinus drainage. No fevers or body aches. No chest pain. Would continue to monitor symptoms along with fever curve/wbc - no need for antibiotics presently. Advised to ambulate more frequently, flutter valve/IS for pulmonary hygiene and supportive care for symptoms.  history of schizoaffective disorder, Cocaine use disorder, recent overdose on medications and suicidal/homicidal ideation = would still continue with inpatient psychiatry admission following medical admission for care. Continue with zyprexa 10mg  qhs.  Janene Madeira, MSN, NP-C [facility] for [facility]ious [place] [facility] Cell: [phone] Pager: [phone]  03/02/2017, 3:11 PM

## 2017-03-03 LAB — RENAL FUNCTION PANEL
ALBUMIN: 2.6 g/dL — AB (ref 3.5–5.0)
ANION GAP: 18 — AB (ref 5–15)
BUN: 92 mg/dL — AB (ref 6–20)
CHLORIDE: 95 mmol/L — AB (ref 101–111)
CO2: 21 mmol/L — ABNORMAL LOW (ref 22–32)
Calcium: 7.9 mg/dL — ABNORMAL LOW (ref 8.9–10.3)
Creatinine, Ser: 12.89 mg/dL — ABNORMAL HIGH (ref 0.61–1.24)
GFR calc Af Amer: 4 mL/min — ABNORMAL LOW (ref >60)
GFR calc non Af Amer: 4 mL/min — ABNORMAL LOW (ref >60)
Glucose, Bld: 92 mg/dL (ref 65–99)
PHOSPHORUS: 10.2 mg/dL — AB (ref 2.5–4.6)
POTASSIUM: 4.5 mmol/L (ref 3.5–5.1)
Sodium: 134 mmol/L — ABNORMAL LOW (ref 135–145)

## 2017-03-03 LAB — CULTURE, BLOOD (ROUTINE X 2)
CULTURE: NO GROWTH
Culture: NO GROWTH
Special Requests: ADEQUATE

## 2017-03-03 LAB — CK: Total CK: 482 U/L — ABNORMAL HIGH (ref 49–397)

## 2017-03-03 NOTE — Progress Notes (Signed)
PROGRESS NOTE  MATTHER LABELL WGY:[id number] DOB: [birth year] DOA: 02/19/2017 PCP: Carlyle Basques, MD  HPI/Recap of past 70 hours: 52 year old male with history of HIV, schizophrenia who presented with a 3-week history of suicidal and homicidal ideation. He took a bunch of pills 2 days prior to admission and had been doing cocaine. He felt like he wanted to kill himself and other people. He came to the ER due to sig decreased urine output. In the ED, was found to have rhabdomyolysis with acute renal failure requiring HD which was started on 2/15. On 2/18 spiked a temp, found to have PNA. Psych is recommending inpatient psychiatric facility admission, once medically stable and the need for long term HD.  Today, met pt sleeping, easily arousable. Denies any new complaints, denies chest pain, SOB, cough, fever/chills.  Assessment/Plan: Principal Problem:   Schizoaffective disorder, depressive type ([place]) Active Problems:   HIV disease ([place])   Cocaine abuse ([place])   AKI (acute kidney injury) ([place])   Rhabdomyolysis   Suicidal overdose ([place])   Acute hepatitis   Schizoaffective disorder, bipolar type ([place])   Acute renal failure (ARF) ([place])   Suicidal ideations   Chronic 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 coma ([place])  Acute renal failure requiring HD Likely due to Rhabdomyolysis Vs med induced-->ATN  CK above 50,000. ---25,000--12,000---18,000---2,000-->1,141 Nephrology on board, continue HD prn Renal US; negative for hydronephrosis  Health care associated PNA Currently afebrile, with no leukocytosis Blood cultures X 2, NGTD  Chest x ray ??left lower lobe infiltrates VS atelectatsis IV Vanc + IV Aztreonam discontinued by ID team  Suicidal overdose  Sitter at bedside, has IVC Psych consulted. Recommend inpatient psychiatric admission once stable Started on zyprexa. monitor QTc on EKG Psych recommend increase Zyprexa to 10 mg. Will repeat EKG prior to increase dose.  Will hold off for now  Transaminases Trended down Could be related to muscle injury.  Hepatitis panel ordered  HIV/Hep B ID consulted to change ART meds due to ARF Now on Prezcobix, Epivir, Retrovir  He will need Emergency ADAP program  HIV medications at discharge to be determined, depending on need for HD  Schizoaffective disorder, depressive type ([place]) Started on Zyprexa  Polysubstance abuse Cocaine use, counseled against use   Code Status: Full  Family Communication: None at bedside  Disposition Plan: TBD   Consultants:  ID  Nephrology  Psych   Procedures:  None  Antimicrobials: None  S/P IV Vanc and IV Aztreonam   DVT prophylaxis:  Heparin   Objective: Vitals:   03/02/17 1826 03/02/17 2147 03/03/17 0650 03/03/17 1051  BP: (!) 158/93 (!) 154/89 138/84 131/80  Pulse: 81 81 74 79  Resp: '18 18 18 20  '$ Temp: 99.2 F (37.3 C) 98.9 F (37.2 C) 98.3 F (36.8 C) 98.1 F (36.7 C)  TempSrc: Oral Oral Oral Oral  SpO2: 100% 100% 97% 97%  Weight:      Height:        Intake/Output Summary (Last 24 hours) at 03/03/2017 1423 Last data filed at 03/03/2017 0829 Gross per 24 hour  Intake 1080 ml  Output 0 ml  Net 1080 ml   Filed Weights   03/01/17 0500 03/01/17 2053 03/02/17 0413  Weight: 105.6 kg (232 lb 12.9 oz) 105.2 kg (231 lb 14.8 oz) 105.2 kg (231 lb 14.8 oz)    Exam:   General: NAD    Cardiovascular: S1, S2 present  Respiratory: CTA  Abdomen: BS present, NT, ND, soft  Musculoskeletal: No pedal edema noted  Skin: Normal  Psychiatry: Stable mood   Data Reviewed: CBC: Recent Labs  Lab 02/26/17 0808 02/28/17 0600 02/28/17 0844  WBC 7.3 5.7 5.6  HGB 10.3* 10.2* 10.5*  HCT 29.9* 29.5* 29.7*  MCV 86.2 88.1 88.1  PLT 150 142* 361*   Basic Metabolic Panel: Recent Labs  Lab 02/28/17 0600 02/28/17 0844 03/01/17 0403 03/02/17 0524 03/03/17 0515  NA 134* 132* 133* 133* 134*  K 4.4 4.2 4.3 4.4 4.5  CL 94* 95* 95* 95* 95*   CO2 '24 23 23 '$ 21* 21*  GLUCOSE 96 149* 99 96 92  BUN 74* 74* 54* 74* 92*  CREATININE 9.79* 9.85* 8.76* 11.15* 12.89*  CALCIUM 7.8* 7.7* 7.8* 7.9* 7.9*  PHOS 7.4* 7.5* 7.1* 9.1* 10.2*   GFR: Estimated Creatinine Clearance: 8.8 mL/min (A) (by C-G formula based on SCr of 12.89 mg/dL (H)). Liver Function Tests: Recent Labs  Lab 02/25/17 1005  02/26/17 1349  02/28/17 0600 02/28/17 0844 03/01/17 0403 03/02/17 0524 03/03/17 0515  AST 62*  --  45*  --   --   --   --   --   --   ALT 113*  --  95*  --   --   --   --   --   --   ALKPHOS 61  --  69  --   --   --   --   --   --   BILITOT 0.8  --  0.7  --   --   --   --   --   --   PROT 5.8*  --  5.8*  --   --   --   --   --   --   ALBUMIN 2.6*  2.6*   < > 2.6*   < > 2.5* 2.5* 2.5* 2.5* 2.6*   < > = values in this interval not displayed.   No results for input(s): LIPASE, AMYLASE in the last 168 hours. No results for input(s): AMMONIA in the last 168 hours. Coagulation Profile: No results for input(s): INR, PROTIME in the last 168 hours. Cardiac Enzymes: Recent Labs  Lab 02/27/17 0449 02/28/17 0600 03/01/17 0403 03/02/17 0524 03/03/17 0515  CKTOTAL 2,092* 1,141* 758* 565* 482*   BNP (last 3 results) No results for input(s): PROBNP in the last 8760 hours. HbA1C: No results for input(s): HGBA1C in the last 72 hours. CBG: No results for input(s): GLUCAP in the last 168 hours. Lipid Profile: No results for input(s): CHOL, HDL, LDLCALC, TRIG, CHOLHDL, LDLDIRECT in the last 72 hours. Thyroid Function Tests: No results for input(s): TSH, T4TOTAL, FREET4, T3FREE, THYROIDAB in the last 72 hours. Anemia Panel: No results for input(s): VITAMINB12, FOLATE, FERRITIN, TIBC, IRON, RETICCTPCT in the last 72 hours. Urine analysis:    Component Value Date/Time   COLORURINE AMBER (A) 02/20/2017 1100   APPEARANCEUR HAZY (A) 02/20/2017 1100   LABSPEC 1.020 02/20/2017 1100   PHURINE 5.0 02/20/2017 1100   GLUCOSEU 50 (A) 02/20/2017 1100    HGBUR LARGE (A) 02/20/2017 1100   BILIRUBINUR NEGATIVE 02/20/2017 1100   KETONESUR NEGATIVE 02/20/2017 1100   PROTEINUR 100 (A) 02/20/2017 1100   NITRITE NEGATIVE 02/20/2017 1100   LEUKOCYTESUR NEGATIVE 02/20/2017 1100   Sepsis Labs: '@LABRCNTIP'$ (procalcitonin:4,lacticidven:4)  ) Recent Results (from the past 240 hour(s))  Culture, blood (routine x 2)     Status: None   Collection Time: 02/26/17  2:02 PM  Result Value Ref Range Status  Specimen Description BLOOD LEFT ANTECUBITAL  Final   Special Requests   Final    BOTTLES DRAWN AEROBIC AND ANAEROBIC Blood Culture adequate volume   Culture   Final    NO GROWTH 5 DAYS Performed at [facility], [address], [place], [place] [address]    Report Status 03/03/2017 FINAL  Final  Culture, blood (routine x 2)     Status: None   Collection Time: 02/26/17  2:11 PM  Result Value Ref Range Status   Specimen Description BLOOD RIGHT ANTECUBITAL  Final   Special Requests   Final    BOTTLES DRAWN AEROBIC AND ANAEROBIC Blood Culture results may not be optimal due to an excessive volume of blood received in culture bottles   Culture   Final    NO GROWTH 5 DAYS Performed at [facility], [place] [address], [place], [place] [address]    Report Status 03/03/2017 FINAL  Final      Studies: No results found.  Scheduled Meds: . darunavir-cobicistat  1 tablet Oral Q breakfast  . feeding supplement (PRO-STAT SUGAR FREE 64)  30 mL Oral BID  . ferric citrate  420 mg Oral TID WC  . heparin injection (subcutaneous)  5,000 Units Subcutaneous Q8H  . lamiVUDine  50 mg Oral Daily  . multivitamin  1 tablet Oral QHS  . OLANZapine  5 mg Oral QHS  . zidovudine  300 mg Oral Daily    Continuous Infusions:    LOS: 12 days     Alma Friendly, MD [facility]  If 7PM-7AM, please contact night-coverage [email or web address] Password [facility] 03/03/2017, 2:23 PM

## 2017-03-03 NOTE — Progress Notes (Signed)
Subjective:   Nausea resolved, feeling good, no c/o, no n/v/d, no jerking  Objective Vital signs in last 24 hours: Vitals:   03/02/17 1826 03/02/17 2147 03/03/17 0650 03/03/17 1051  BP: (!) 158/93 (!) 154/89 138/84 131/80  Pulse: 81 81 74 79  Resp: 18 18 18 20   Temp: 99.2 F (37.3 C) 98.9 F (37.2 C) 98.3 F (36.8 C) 98.1 F (36.7 C)  TempSrc: Oral Oral Oral Oral  SpO2: 100% 100% 97% 97%  Weight:      Height:       Weight change:   Physical Exam: General: Alert AAM NAD Heat= RRR, no rub or mur. Lungs: CTAB A/P Abdomen: Active BS Extremities: No LE edema Dialysis Access: RIJ TDC drsg CDI.   Problem/Plan: 1.AKI 2/2 [facility] admitting CK >50000CK now under 1000.  Started HD acutely on 2/15, has had 5 sessions, last on 2/20.  Now making urine after being anuric, so we are holding HD for now, see if he is recovering. Has temp cath in as needed for HD.  Creat 12 today, cont to hold HD for now, no gross uremic sx.  2. HCAP: sp course of IV abx (vanc/ aztreonam), dc'd on 2/21 3.Anemia -HGB 10.5  Follow trend. 4. Secondary hyperparathyroidism -started Auryxia  1 ac for high phos 5.HIV: ID consulted. Prezcobix, Epivir and Retrovir. Per primary 6. Nutrition -Albumin 2.6.> 2.5  Change to renal diet, add prostat, renal vit. 7. PNA: On Vanc/ Aztreonam per primary. 8. Schizoaffective disorder. On Zyprexa. 9.Suicide Attempt/OD-IVD. Psych consult./ sitter Holding transfer to psych facility until medically stable.    Dylan Splinter MD [employer] pgr [phone]   03/03/2017, 12:46 PM         Labs: Basic Metabolic Panel: Recent Labs  Lab 03/01/17 0403 03/02/17 0524 03/03/17 0515  NA 133* 133* 134*  K 4.3 4.4 4.5  CL 95* 95* 95*  CO2 23 21* 21*  GLUCOSE 99 96 92  BUN 54* 74* 92*  CREATININE 8.76* 11.15* 12.89*  CALCIUM 7.8* 7.9* 7.9*  PHOS 7.1* 9.1* 10.2*   Liver Function Tests: Recent Labs  Lab 02/25/17 1005  02/26/17 1349   03/01/17 0403 03/02/17 0524 03/03/17 0515  AST 62*  --  45*  --   --   --   --   ALT 113*  --  95*  --   --   --   --   ALKPHOS 61  --  69  --   --   --   --   BILITOT 0.8  --  0.7  --   --   --   --   PROT 5.8*  --  5.8*  --   --   --   --   ALBUMIN 2.6*  2.6*   < > 2.6*   < > 2.5* 2.5* 2.6*   < > = values in this interval not displayed.   No results for input(s): LIPASE, AMYLASE in the last 168 hours. No results for input(s): AMMONIA in the last 168 hours. CBC: Recent Labs  Lab 02/26/17 0808 02/28/17 0600 02/28/17 0844  WBC 7.3 5.7 5.6  HGB 10.3* 10.2* 10.5*  HCT 29.9* 29.5* 29.7*  MCV 86.2 88.1 88.1  PLT 150 142* 145*   Cardiac Enzymes: Recent Labs  Lab 02/27/17 0449 02/28/17 0600 03/01/17 0403 03/02/17 0524 03/03/17 0515  CKTOTAL 2,092* 1,141* 758* 565* 482*    Medications:  . darunavir-cobicistat  1 tablet Oral Q breakfast  .  feeding supplement (PRO-STAT SUGAR FREE 64)  30 mL Oral BID  . ferric citrate  420 mg Oral TID WC  . heparin injection (subcutaneous)  5,000 Units Subcutaneous Q8H  . lamiVUDine  50 mg Oral Daily  . multivitamin  1 tablet Oral QHS  . OLANZapine  5 mg Oral QHS  . zidovudine  300 mg Oral Daily

## 2017-03-04 LAB — RENAL FUNCTION PANEL
ANION GAP: 17 — AB (ref 5–15)
Albumin: 2.7 g/dL — ABNORMAL LOW (ref 3.5–5.0)
BUN: 107 mg/dL — ABNORMAL HIGH (ref 6–20)
CO2: 21 mmol/L — ABNORMAL LOW (ref 22–32)
Calcium: 7.9 mg/dL — ABNORMAL LOW (ref 8.9–10.3)
Chloride: 96 mmol/L — ABNORMAL LOW (ref 101–111)
Creatinine, Ser: 13.89 mg/dL — ABNORMAL HIGH (ref 0.61–1.24)
GFR calc Af Amer: 4 mL/min — ABNORMAL LOW (ref >60)
GFR, EST NON AFRICAN AMERICAN: 4 mL/min — AB (ref >60)
GLUCOSE: 89 mg/dL (ref 65–99)
PHOSPHORUS: 10 mg/dL — AB (ref 2.5–4.6)
POTASSIUM: 4.5 mmol/L (ref 3.5–5.1)
Sodium: 134 mmol/L — ABNORMAL LOW (ref 135–145)

## 2017-03-04 LAB — CK: CK TOTAL: 418 U/L — AB (ref 49–397)

## 2017-03-04 NOTE — Progress Notes (Signed)
PROGRESS NOTE  Dylan [name] IEP:[id number] DOB: December 16, 1965 DOA: 02/19/2017 PCP: Carlyle Basques, MD  HPI/Recap of past 20 hours: 52 year old male with history of HIV, schizophrenia who presented with a 3-week history of suicidal and homicidal ideation. He took a bunch of pills 2 days prior to admission and had been doing cocaine. He felt like he wanted to kill himself and other people. He came to the ER due to sig decreased urine output. In the ED, was found to have rhabdomyolysis with acute renal failure requiring HD which was started on 2/15. On 2/18 spiked a temp, found to have PNA. Psych is recommending inpatient psychiatric facility admission, once medically stable and the need for long term HD.  Today, pt reported feeling well, denies any new complaints, denies N/V, chest pain, SOB, cough, fever/chills.  Assessment/Plan: Principal Problem:   Schizoaffective disorder, depressive type ([place]) Active Problems:   HIV disease ([place])   Cocaine abuse ([place])   AKI (acute kidney injury) ([place])   Rhabdomyolysis   Suicidal overdose ([place])   Acute hepatitis   Schizoaffective disorder, bipolar type ([place])   Acute renal failure (ARF) ([place])   Suicidal ideations   Chronic 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 coma ([place])  Acute renal failure requiring HD Likely due to Rhabdomyolysis Vs med induced-->ATN  CK above 50,000. ---25,000--12,000---18,000---2,000-->1,141 Nephrology on board, continue HD prn Renal US; negative for hydronephrosis  Health care associated PNA Currently afebrile, with no leukocytosis Blood cultures X 2, NGTD  Chest x ray ??left lower lobe infiltrates VS atelectatsis IV Vanc + IV Aztreonam discontinued by ID team  Suicidal overdose  Sitter at bedside, has IVC Psych consulted. Recommend inpatient psychiatric admission once stable Started on zyprexa. monitor QTc on EKG Psych recommend increase Zyprexa to 10 mg. Will repeat EKG prior to increase dose. Will  hold off for now  Transaminases Trended down Could be related to muscle injury Hepatitis panel ordered  HIV/Hep B ID consulted to change ART meds due to ARF Now on Prezcobix, Epivir, Retrovir  He will need Emergency ADAP program  HIV medications at discharge to be determined, depending on need for HD  Schizoaffective disorder, depressive type ([place]) Started on Zyprexa  Polysubstance abuse Cocaine use, counseled against use   Code Status: Full  Family Communication: None at bedside  Disposition Plan: TBD   Consultants:  ID  Nephrology  Psych   Procedures:  None  Antimicrobials: None  S/P IV Vanc and IV Aztreonam   DVT prophylaxis:  Heparin   Objective: Vitals:   03/03/17 1733 03/03/17 2202 03/04/17 0451 03/04/17 1009  BP: (!) 145/89 (!) 149/97 (!) 154/87 (!) 141/88  Pulse: 83 83 79 77  Resp: 20 18 18 18   Temp: 98.1 F (36.7 C) 99 F (37.2 C) 98.1 F (36.7 C) 98.1 F (36.7 C)  TempSrc: Oral Oral Oral Oral  SpO2: 100% 100% 100% 100%  Weight:      Height:        Intake/Output Summary (Last 24 hours) at 03/04/2017 1538 Last data filed at 03/04/2017 1245 Gross per 24 hour  Intake 840 ml  Output 2350 ml  Net -1510 ml   Filed Weights   03/01/17 0500 03/01/17 2053 03/02/17 0413  Weight: 105.6 kg (232 lb 12.9 oz) 105.2 kg (231 lb 14.8 oz) 105.2 kg (231 lb 14.8 oz)    Exam:   General: NAD    Cardiovascular: S1, S2 present  Respiratory: CTA  Abdomen: BS present, NT, ND, soft  Musculoskeletal: No pedal edema noted  Skin: Normal  Psychiatry: Stable mood   Data Reviewed: CBC: Recent Labs  Lab 02/26/17 0808 02/28/17 0600 02/28/17 0844  WBC 7.3 5.7 5.6  HGB 10.3* 10.2* 10.5*  HCT 29.9* 29.5* 29.7*  MCV 86.2 88.1 88.1  PLT 150 142* 630*   Basic Metabolic Panel: Recent Labs  Lab 02/28/17 0844 03/01/17 0403 03/02/17 0524 03/03/17 0515 03/04/17 0507  NA 132* 133* 133* 134* 134*  K 4.2 4.3 4.4 4.5 4.5  CL 95* 95* 95* 95*  96*  CO2 23 23 21* 21* 21*  GLUCOSE 149* 99 96 92 89  BUN 74* 54* 74* 92* 107*  CREATININE 9.85* 8.76* 11.15* 12.89* 13.89*  CALCIUM 7.7* 7.8* 7.9* 7.9* 7.9*  PHOS 7.5* 7.1* 9.1* 10.2* 10.0*   GFR: Estimated Creatinine Clearance: 8.1 mL/min (A) (by C-G formula based on SCr of 13.89 mg/dL (H)). Liver Function Tests: Recent Labs  Lab 02/26/17 1349  02/28/17 0844 03/01/17 0403 03/02/17 0524 03/03/17 0515 03/04/17 0507  AST 45*  --   --   --   --   --   --   ALT 95*  --   --   --   --   --   --   ALKPHOS 69  --   --   --   --   --   --   BILITOT 0.7  --   --   --   --   --   --   PROT 5.8*  --   --   --   --   --   --   ALBUMIN 2.6*   < > 2.5* 2.5* 2.5* 2.6* 2.7*   < > = values in this interval not displayed.   No results for input(s): LIPASE, AMYLASE in the last 168 hours. No results for input(s): AMMONIA in the last 168 hours. Coagulation Profile: No results for input(s): INR, PROTIME in the last 168 hours. Cardiac Enzymes: Recent Labs  Lab 02/28/17 0600 03/01/17 0403 03/02/17 0524 03/03/17 0515 03/04/17 0507  CKTOTAL 1,141* 758* 565* 482* 418*   BNP (last 3 results) No results for input(s): PROBNP in the last 8760 hours. HbA1C: No results for input(s): HGBA1C in the last 72 hours. CBG: No results for input(s): GLUCAP in the last 168 hours. Lipid Profile: No results for input(s): CHOL, HDL, LDLCALC, TRIG, CHOLHDL, LDLDIRECT in the last 72 hours. Thyroid Function Tests: No results for input(s): TSH, T4TOTAL, FREET4, T3FREE, THYROIDAB in the last 72 hours. Anemia Panel: No results for input(s): VITAMINB12, FOLATE, FERRITIN, TIBC, IRON, RETICCTPCT in the last 72 hours. Urine analysis:    Component Value Date/Time   COLORURINE AMBER (A) 02/20/2017 1100   APPEARANCEUR HAZY (A) 02/20/2017 1100   LABSPEC 1.020 02/20/2017 1100   PHURINE 5.0 02/20/2017 1100   GLUCOSEU 50 (A) 02/20/2017 1100   HGBUR LARGE (A) 02/20/2017 1100   BILIRUBINUR NEGATIVE 02/20/2017 1100    KETONESUR NEGATIVE 02/20/2017 1100   PROTEINUR 100 (A) 02/20/2017 1100   NITRITE NEGATIVE 02/20/2017 1100   LEUKOCYTESUR NEGATIVE 02/20/2017 1100   Sepsis Labs: @LABRCNTIP (procalcitonin:4,lacticidven:4)  ) Recent Results (from the past 240 hour(s))  Culture, blood (routine x 2)     Status: None   Collection Time: 02/26/17  2:02 PM  Result Value Ref Range Status   Specimen Description BLOOD LEFT ANTECUBITAL  Final   Special Requests   Final    BOTTLES DRAWN AEROBIC AND ANAEROBIC Blood Culture adequate volume  Culture   Final    NO GROWTH 5 DAYS Performed at [facility], [place] [address] [place], [place] [address]    Report Status 03/03/2017 FINAL  Final  Culture, blood (routine x 2)     Status: None   Collection Time: 02/26/17  2:11 PM  Result Value Ref Range Status   Specimen Description BLOOD RIGHT ANTECUBITAL  Final   Special Requests   Final    BOTTLES DRAWN AEROBIC AND ANAEROBIC Blood Culture results may not be optimal due to an excessive volume of blood received in culture bottles   Culture   Final    NO GROWTH 5 DAYS Performed at [facility], [place] [address], [place], [place] [address]    Report Status 03/03/2017 FINAL  Final      Studies: No results found.  Scheduled Meds: . darunavir-cobicistat  1 tablet Oral Q breakfast  . feeding supplement (PRO-STAT SUGAR FREE 64)  30 mL Oral BID  . ferric citrate  420 mg Oral TID WC  . heparin injection (subcutaneous)  5,000 Units Subcutaneous Q8H  . lamiVUDine  50 mg Oral Daily  . multivitamin  1 tablet Oral QHS  . OLANZapine  5 mg Oral QHS  . zidovudine  300 mg Oral Daily    Continuous Infusions:    LOS: 13 days     Alma Friendly, MD [facility]  If 7PM-7AM, please contact night-coverage [email or web address] Password [facility] 03/04/2017, 3:38 PM

## 2017-03-04 NOTE — Progress Notes (Signed)
Subjective:   1800 cc UOP yesterday, creat up to 13 today, no n/v or SOB.   Objective Vital signs in last 24 hours: Vitals:   03/03/17 1733 03/03/17 2202 03/04/17 0451 03/04/17 1009  BP: (!) 145/89 (!) 149/97 (!) 154/87 (!) 141/88  Pulse: 83 83 79 77  Resp: 20 18 18 18   Temp: 98.1 F (36.7 C) 99 F (37.2 C) 98.1 F (36.7 C) 98.1 F (36.7 C)  TempSrc: Oral Oral Oral Oral  SpO2: 100% 100% 100% 100%  Weight:      Height:       Weight change:   Physical Exam: General: Alert AAM NAD Heat= RRR, no rub or mur. Lungs: CTAB A/P Abdomen: Active BS Extremities: No LE edema Dialysis Access: RIJ TDC drsg CDI.   Problem/Plan: 1.AKI 2/2 [facility] admitting CK >50000CK now under 1000.  1st HD on 2/15, had 5 sessions, last on 2/20.  Making urine now, holding dialysis, creat may be leveling off. No uremic symptoms, no HD today.  2. HCAP: sp course of IV abx (vanc/ aztreonam), dc'd 2/21 3.Anemia -HGB 10.5  Follow trend. 4. Secondary hyperparathyroidism -started Auryxia  1 ac for high phos 5.HIV: ID consulted. Prezcobix, Epivir and Retrovir. Per primary 6. Nutrition -Albumin 2.6.> 2.5  Change to renal diet, add prostat, renal vit. 7. PNA: On Vanc/ Aztreonam per primary. 8. Schizoaffective disorder. On Zyprexa. 9.Suicide Attempt/OD-IVD. Psych consult./ sitter Holding transfer to psych facility until medically stable.    Kelly Splinter MD [employer] pgr [phone]   03/04/2017, 12:38 PM         Labs: Basic Metabolic Panel: Recent Labs  Lab 03/02/17 0524 03/03/17 0515 03/04/17 0507  NA 133* 134* 134*  K 4.4 4.5 4.5  CL 95* 95* 96*  CO2 21* 21* 21*  GLUCOSE 96 92 89  BUN 74* 92* 107*  CREATININE 11.15* 12.89* 13.89*  CALCIUM 7.9* 7.9* 7.9*  PHOS 9.1* 10.2* 10.0*   Liver Function Tests: Recent Labs  Lab 02/26/17 1349  03/02/17 0524 03/03/17 0515 03/04/17 0507  AST 45*  --   --   --   --   ALT 95*  --   --   --   --   ALKPHOS 69  --   --    --   --   BILITOT 0.7  --   --   --   --   PROT 5.8*  --   --   --   --   ALBUMIN 2.6*   < > 2.5* 2.6* 2.7*   < > = values in this interval not displayed.   No results for input(s): LIPASE, AMYLASE in the last 168 hours. No results for input(s): AMMONIA in the last 168 hours. CBC: Recent Labs  Lab 02/26/17 0808 02/28/17 0600 02/28/17 0844  WBC 7.3 5.7 5.6  HGB 10.3* 10.2* 10.5*  HCT 29.9* 29.5* 29.7*  MCV 86.2 88.1 88.1  PLT 150 142* 145*   Cardiac Enzymes: Recent Labs  Lab 02/28/17 0600 03/01/17 0403 03/02/17 0524 03/03/17 0515 03/04/17 0507  CKTOTAL 1,141* 758* 565* 482* 418*    Medications:  . darunavir-cobicistat  1 tablet Oral Q breakfast  . feeding supplement (PRO-STAT SUGAR FREE 64)  30 mL Oral BID  . ferric citrate  420 mg Oral TID WC  . heparin injection (subcutaneous)  5,000 Units Subcutaneous Q8H  . lamiVUDine  50 mg Oral Daily  . multivitamin  1 tablet Oral QHS  . OLANZapine  5 mg Oral QHS  . zidovudine  300 mg Oral Daily

## 2017-03-05 LAB — RENAL FUNCTION PANEL
ANION GAP: 19 — AB (ref 5–15)
Albumin: 2.9 g/dL — ABNORMAL LOW (ref 3.5–5.0)
BUN: 118 mg/dL — AB (ref 6–20)
CHLORIDE: 98 mmol/L — AB (ref 101–111)
CO2: 20 mmol/L — ABNORMAL LOW (ref 22–32)
Calcium: 8.5 mg/dL — ABNORMAL LOW (ref 8.9–10.3)
Creatinine, Ser: 14.18 mg/dL — ABNORMAL HIGH (ref 0.61–1.24)
GFR calc Af Amer: 4 mL/min — ABNORMAL LOW (ref >60)
GFR, EST NON AFRICAN AMERICAN: 3 mL/min — AB (ref >60)
Glucose, Bld: 105 mg/dL — ABNORMAL HIGH (ref 65–99)
POTASSIUM: 4.9 mmol/L (ref 3.5–5.1)
Phosphorus: 10.4 mg/dL — ABNORMAL HIGH (ref 2.5–4.6)
Sodium: 137 mmol/L (ref 135–145)

## 2017-03-05 MED ORDER — TENOFOVIR DISOPROXIL FUMARATE 300 MG PO TABS
300.0000 mg | ORAL_TABLET | ORAL | Status: DC
  Administered 2017-03-06 – 2017-03-13 (×2): 300 mg via ORAL
  Filled 2017-03-05 (×2): qty 1

## 2017-03-05 MED ORDER — ALUMINUM HYDROXIDE GEL 320 MG/5ML PO SUSP
30.0000 mL | Freq: Three times a day (TID) | ORAL | Status: DC
  Filled 2017-03-05 (×3): qty 30

## 2017-03-05 MED ORDER — DOLUTEGRAVIR SODIUM 50 MG PO TABS
50.0000 mg | ORAL_TABLET | Freq: Every day | ORAL | Status: DC
  Administered 2017-03-06 – 2017-03-14 (×9): 50 mg via ORAL
  Filled 2017-03-05 (×9): qty 1

## 2017-03-05 MED ORDER — HEPARIN SODIUM (PORCINE) 1000 UNIT/ML DIALYSIS: 40.0000 [IU]/kg | INTRAMUSCULAR | Status: DC | PRN

## 2017-03-05 MED ORDER — ALTEPLASE 2 MG IJ SOLR
INTRAMUSCULAR | Status: AC
  Filled 2017-03-05: qty 4

## 2017-03-05 MED ORDER — ALTEPLASE 2 MG IJ SOLR
4.0000 mg | Freq: Once | INTRAMUSCULAR | Status: AC
  Administered 2017-03-05: 4 mg

## 2017-03-05 NOTE — Progress Notes (Signed)
Patient ID: Dylan [name], male   DOB: 02-Jun-1965, 52 y.o.   MRN: [id number] [facility] Progress Note   Assessment/ Plan:   1.  Acute kidney injury: Secondary to rhabdomyolysis/pigment associated nephropathy.  Over the weekend, appeared to have increasing urine output however clearance is still lagging.  He does have some subtle uremic symptoms with dysgeusia and poor appetite that remained persistent.  Plan to undertake hemodialysis today for clearance without any ultrafiltration.  Continue daily lab monitoring, he is euvolemic on physical exam and does not have any asterixis. 2.  Healthcare associated pneumonia: Status post completion of intravenous vancomycin and aztreonam-stop date was 03/01/17.  Currently remains afebrile and without cough/shortness of breath. 3.  HIV infection: On antiretroviral therapy, management/adjustments per primary service and infectious diseases. 4.  Schizoaffective disorder: On Zyprexa and currently with a sitter by bedside due to suicidal attempt with overdosing.  Awaiting medical stability prior to transfer to inpatient psychiatry unit. 5.  Hyperphosphatemia: Secondary to acute kidney injury, will discontinue Auryxia and start him on aluminum hydroxide for the next 3 days.  Subjective:   Reports to be feeling fair-still having problems with appetite and poor taste in his mouth.  Denies any abnormal limb jerking movements, shortness of breath or chest pain.   Objective:   BP (!) 145/86 (BP Location: Right Arm)   Pulse 81   Temp 98.8 F (37.1 C) (Oral)   Resp 20   Ht 6\' 2"  (1.88 m)   Wt 232 lb 2.3 oz (105.3 kg)   SpO2 100%   BMI 29.81 kg/m   Intake/Output Summary (Last 24 hours) at 03/05/2017 1233 Last data filed at 03/05/2017 0836 Gross per 24 hour  Intake 1440 ml  Output 950 ml  Net 490 ml   Weight change:   Physical Exam: Gen: Comfortably resting in bed, watching television.  Sitter at bedside. CVS: Pulse regular rhythm, normal  rate, S1 and S2 normal Resp: Coarse breath sounds bilaterally, no distinct rales or rhonchi Abd: Soft, flat, nontender Ext: No lower extremity edema, right IJ temporary catheter in place.  Imaging: No results found.  Labs: BMET Recent Labs  Lab 02/28/17 0600 02/28/17 0844 03/01/17 0403 03/02/17 0524 03/03/17 0515 03/04/17 0507 03/05/17 0548  NA 134* 132* 133* 133* 134* 134* 137  K 4.4 4.2 4.3 4.4 4.5 4.5 4.9  CL 94* 95* 95* 95* 95* 96* 98*  CO2 24 23 23  21* 21* 21* 20*  GLUCOSE 96 149* 99 96 92 89 105*  BUN 74* 74* 54* 74* 92* 107* 118*  CREATININE 9.79* 9.85* 8.76* 11.15* 12.89* 13.89* 14.18*  CALCIUM 7.8* 7.7* 7.8* 7.9* 7.9* 7.9* 8.5*  PHOS 7.4* 7.5* 7.1* 9.1* 10.2* 10.0* 10.4*   CBC Recent Labs  Lab 02/28/17 0600 02/28/17 0844  WBC 5.7 5.6  HGB 10.2* 10.5*  HCT 29.5* 29.7*  MCV 88.1 88.1  PLT 142* 145*    Medications:    . darunavir-cobicistat  1 tablet Oral Q breakfast  . feeding supplement (PRO-STAT SUGAR FREE 64)  30 mL Oral BID  . ferric citrate  420 mg Oral TID WC  . heparin injection (subcutaneous)  5,000 Units Subcutaneous Q8H  . multivitamin  1 tablet Oral QHS  . OLANZapine  5 mg Oral QHS  . [START ON 03/06/2017] tenofovir  300 mg Oral Weekly  . zidovudine  300 mg Oral Daily   Elmarie Shiley, MD 03/05/2017, 12:33 PM

## 2017-03-05 NOTE — Progress Notes (Signed)
[facility] for Infectious Disease    Date of Admission:  02/19/2017   Total days of antibiotics 3          ID: Dylan [name] is a 52 y.o. male with  52 year old male with history of HIV, schizophrenia who presented with a 3-week history of suicidal and homicidal ideation. He took a bunch of pills 2 days prior to admission and had been doing cocaine. He felt like he wanted to kill himself and other people. He came to the ER due to sig decreased urine output; found to have rhabdomyolysis with ARF requiring HD which started on 2/15. On 2/18 spiked a temp, isolated but cxr found infiltrate and dx as pneumonia.   Principal Problem:   Schizoaffective disorder, depressive type ([place]) Active Problems:   HIV disease ([place])   Cocaine abuse ([place])   AKI (acute kidney injury) ([place])   Rhabdomyolysis   Suicidal overdose (H[facility])   Acute hepatitis   Schizoaffective disorder, bipolar type ([place])   Acute renal failure (ARF) ([place])   Suicidal ideations   Chronic 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 coma ([place])    Subjective: Feeling OK - not sleeping well here in the hospital. He has been making more urine so tells me that the nephrology team are holding off on HD for now. No fevers or chills.   Medications:  . darunavir-cobicistat  1 tablet Oral Q breakfast  . feeding supplement (PRO-STAT SUGAR FREE 64)  30 mL Oral BID  . ferric citrate  420 mg Oral TID WC  . heparin injection (subcutaneous)  5,000 Units Subcutaneous Q8H  . lamiVUDine  50 mg Oral Daily  . multivitamin  1 tablet Oral QHS  . OLANZapine  5 mg Oral QHS  . zidovudine  300 mg Oral Daily    Objective: Vital signs in last 24 hours: Temp:  [98.6 F (37 C)-98.8 F (37.1 C)] 98.8 F (37.1 C) (02/25 0856) Pulse Rate:  [56-81] 81 (02/25 0856) Resp:  [18-20] 20 (02/25 0856) BP: (142-157)/(86-89) 145/86 (02/25 0856) SpO2:  [98 %-100 %] 100 % (02/25 0856) Weight:  [232 lb 2.3 oz (105.3 kg)] 232 lb 2.3 oz (105.3 kg)  (02/24 2134)   Physical Exam  Constitutional: He is oriented to person, place, and time. He appears well-developed and well-nourished.  Seated comfortably in chair during visit.   HENT:  Mouth/Throat: Oropharynx is clear and moist and mucous membranes are normal. Normal dentition. No dental abscesses.  Cardiovascular: Normal rate, regular rhythm and normal heart sounds.  Pulmonary/Chest: Effort normal and breath sounds normal.  Abdominal: Soft. He exhibits no distension. There is no tenderness.  Lymphadenopathy:    He has no cervical adenopathy.  Neurological: He is alert and oriented to person, place, and time.  Skin: Skin is warm and dry. No rash noted.  Psychiatric: He has a normal mood and affect. Judgment and thought content normal.  In good spirits today and engaged in care discussion.   Vitals reviewed.    Lab Results Recent Labs    03/04/17 0507 03/05/17 0548  NA 134* 137  K 4.5 4.9  CL 96* 98*  CO2 21* 20*  BUN 107* 118*  CREATININE 13.89* 14.18*   Liver Panel Recent Labs    03/04/17 0507 03/05/17 0548  ALBUMIN 2.7* 2.9*    Studies/Results: No results found.   Assessment/Plan: AKI/rhabdomyolosis =  Originally thought to be side effect to medication however likely due to dose effect of inapropriate  administration of his medications + cocaine. Will add back tenofovir DF and stop lamivudine (inactive for his hiv) to provide full regimen for and cover his Hep B. Planning HD today - making more urine but clearance is not improved yet. Planning HD today per nephrology note.   HIV disease (R-M184V, Y181C) =  Recently suppressed on Biktarvy prior to changing regimen to accomodate for HD.  Continue to use renally dosed regimen as we continue to be encouraged for recovery.  -continue zidovudine 300mg  QD -Stop lamivudine --> start weekly tenofovir DF tomorrow  -start daily dolutegravir tomorrow   Chronic Hep B Infection =  Will check DNA today - may need to  continue to follow for flare. Worry about resistance to lamivudine. Will begin tenofovir DF 300mg  weekly to treat HIV and Hep B.   history of schizoaffective disorder, Cocaine use disorder, recent overdose on medications and suicidal/homicidal ideation =  would still continue with inpatient psychiatry admission following medical admission for care. Continue with zyprexa 10mg  qhs.  Janene Madeira, MSN, NP-C [facility] for Infectious [place] Cell: [phone] Pager: [phone]  03/05/2017, 11:29 AM

## 2017-03-05 NOTE — Progress Notes (Signed)
PROGRESS NOTE  Dylan [name] WJX:[id number] DOB: [birth year] DOA: 02/19/2017 PCP: Carlyle Basques, MD  HPI/Recap of past 29 hours: 52 year old male with history of HIV, schizophrenia who presented with a 3-week history of suicidal and homicidal ideation. He took a bunch of pills 2 days prior to admission and had been doing cocaine. He felt like he wanted to kill himself and other people. He came to the ER due to sig decreased urine output. In the ED, was found to have rhabdomyolysis with acute renal failure requiring HD which was started on 2/15. On 2/18 spiked a temp, found to have PNA. Psych is recommending inpatient psychiatric facility admission, once medically stable and the need for long term HD.  Today, pt reported feeling well, denies any new complaints, denies N/V, chest pain, SOB  Assessment/Plan: Principal Problem:   Schizoaffective disorder, depressive type ([place]) Active Problems:   HIV disease ([place])   Cocaine abuse ([place])   AKI (acute kidney injury) ([place])   Rhabdomyolysis   Suicidal overdose ([place])   Acute hepatitis   Schizoaffective disorder, bipolar type ([place])   Acute renal failure (ARF) ([place])   Suicidal ideations   Chronic 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 coma ([place])  Acute renal failure requiring HD Likely due to Rhabdomyolysis Vs med induced-->ATN  CK above 50,000. ---25,000--12,000---18,000---2,000-->1,141 Nephrology on board, continue HD prn Renal US; negative for hydronephrosis  Health care associated PNA Currently afebrile, with no leukocytosis Blood cultures X 2, NGTD  Chest x ray ??left lower lobe infiltrates VS atelectatsis IV Vanc + IV Aztreonam discontinued by ID team  Suicidal overdose  Sitter at bedside, has IVC Psych consulted. Recommend inpatient psychiatric admission once stable Started on zyprexa. monitor QTc on EKG Psych on board  HIV/Hep B ID consulted to change ART meds due to ARF HIV medications at discharge to be  determined by ID depending on need for HD  Schizoaffective disorder, depressive type ([place]) Started on Zyprexa  Polysubstance abuse Cocaine use, counseled against use   Code Status: Full  Family Communication: None at bedside  Disposition Plan: TBD   Consultants:  ID  Nephrology  Psych   Procedures:  None  Antimicrobials: None  S/P IV Vanc and IV Aztreonam   DVT prophylaxis:  Heparin   Objective: Vitals:   03/05/17 1330 03/05/17 1400 03/05/17 1430 03/05/17 1500  BP: (!) 152/89 (!) 141/93 (!) 151/80 (!) 146/82  Pulse: 78 78 77 73  Resp: 18 18 18 18   Temp:      TempSrc:      SpO2:      Weight:      Height:        Intake/Output Summary (Last 24 hours) at 03/05/2017 1618 Last data filed at 03/05/2017 0836 Gross per 24 hour  Intake 1080 ml  Output 450 ml  Net 630 ml   Filed Weights   03/02/17 0413 03/04/17 2134 03/05/17 1310  Weight: 105.2 kg (231 lb 14.8 oz) 105.3 kg (232 lb 2.3 oz) 101.5 kg (223 lb 12.3 oz)    Exam:   General: NAD    Cardiovascular: S1, S2 present  Respiratory: CTA  Abdomen: BS present, NT, ND, soft  Musculoskeletal: No pedal edema noted  Skin: Normal  Psychiatry: Stable mood   Data Reviewed: CBC: Recent Labs  Lab 02/28/17 0600 02/28/17 0844  WBC 5.7 5.6  HGB 10.2* 10.5*  HCT 29.5* 29.7*  MCV 88.1 88.1  PLT 142* 213*   Basic Metabolic Panel: Recent Labs  Lab 03/01/17 0403 03/02/17 0524 03/03/17 0515 03/04/17 0507 03/05/17 0548  NA 133* 133* 134* 134* 137  K 4.3 4.4 4.5 4.5 4.9  CL 95* 95* 95* 96* 98*  CO2 23 21* 21* 21* 20*  GLUCOSE 99 96 92 89 105*  BUN 54* 74* 92* 107* 118*  CREATININE 8.76* 11.15* 12.89* 13.89* 14.18*  CALCIUM 7.8* 7.9* 7.9* 7.9* 8.5*  PHOS 7.1* 9.1* 10.2* 10.0* 10.4*   GFR: Estimated Creatinine Clearance: 7.8 mL/min (A) (by C-G formula based on SCr of 14.18 mg/dL (H)). Liver Function Tests: Recent Labs  Lab 03/01/17 0403 03/02/17 0524 03/03/17 0515 03/04/17 0507  03/05/17 0548  ALBUMIN 2.5* 2.5* 2.6* 2.7* 2.9*   No results for input(s): LIPASE, AMYLASE in the last 168 hours. No results for input(s): AMMONIA in the last 168 hours. Coagulation Profile: No results for input(s): INR, PROTIME in the last 168 hours. Cardiac Enzymes: Recent Labs  Lab 02/28/17 0600 03/01/17 0403 03/02/17 0524 03/03/17 0515 03/04/17 0507  CKTOTAL 1,141* 758* 565* 482* 418*   BNP (last 3 results) No results for input(s): PROBNP in the last 8760 hours. HbA1C: No results for input(s): HGBA1C in the last 72 hours. CBG: No results for input(s): GLUCAP in the last 168 hours. Lipid Profile: No results for input(s): CHOL, HDL, LDLCALC, TRIG, CHOLHDL, LDLDIRECT in the last 72 hours. Thyroid Function Tests: No results for input(s): TSH, T4TOTAL, FREET4, T3FREE, THYROIDAB in the last 72 hours. Anemia Panel: No results for input(s): VITAMINB12, FOLATE, FERRITIN, TIBC, IRON, RETICCTPCT in the last 72 hours. Urine analysis:    Component Value Date/Time   COLORURINE AMBER (A) 02/20/2017 1100   APPEARANCEUR HAZY (A) 02/20/2017 1100   LABSPEC 1.020 02/20/2017 1100   PHURINE 5.0 02/20/2017 1100   GLUCOSEU 50 (A) 02/20/2017 1100   HGBUR LARGE (A) 02/20/2017 1100   BILIRUBINUR NEGATIVE 02/20/2017 1100   KETONESUR NEGATIVE 02/20/2017 1100   PROTEINUR 100 (A) 02/20/2017 1100   NITRITE NEGATIVE 02/20/2017 1100   LEUKOCYTESUR NEGATIVE 02/20/2017 1100   Sepsis Labs: @LABRCNTIP (procalcitonin:4,lacticidven:4)  ) Recent Results (from the past 240 hour(s))  Culture, blood (routine x 2)     Status: None   Collection Time: 02/26/17  2:02 PM  Result Value Ref Range Status   Specimen Description BLOOD LEFT ANTECUBITAL  Final   Special Requests   Final    BOTTLES DRAWN AEROBIC AND ANAEROBIC Blood Culture adequate volume   Culture   Final    NO GROWTH 5 DAYS Performed at [facility], [place] [address] [place], [place] [address]    Report Status 03/03/2017 FINAL  Final    Culture, blood (routine x 2)     Status: None   Collection Time: 02/26/17  2:11 PM  Result Value Ref Range Status   Specimen Description BLOOD RIGHT ANTECUBITAL  Final   Special Requests   Final    BOTTLES DRAWN AEROBIC AND ANAEROBIC Blood Culture results may not be optimal due to an excessive volume of blood received in culture bottles   Culture   Final    NO GROWTH 5 DAYS Performed at [facility], [name] [address] [place],  [address]    Report Status 03/03/2017 FINAL  Final      Studies: No results found.  Scheduled Meds: . alteplase  4 mg Intracatheter Once  . aluminum hydroxide  30 mL Oral TID  . [START ON 03/06/2017] dolutegravir  50 mg Oral Daily  . feeding supplement (PRO-STAT SUGAR FREE 64)  30 mL Oral BID  . heparin injection (subcutaneous)  5,000 Units Subcutaneous Q8H  . multivitamin  1 tablet Oral QHS  . OLANZapine  5 mg Oral QHS  . [START ON 03/06/2017] tenofovir  300 mg Oral Q Tue  . zidovudine  300 mg Oral Daily    Continuous Infusions:    LOS: 14 days     Alma Friendly, MD [facility]  If 7PM-7AM, please contact night-coverage [email or web address] Password [facility] 03/05/2017, 4:18 PM

## 2017-03-06 LAB — RENAL FUNCTION PANEL
ANION GAP: 15 (ref 5–15)
Albumin: 2.8 g/dL — ABNORMAL LOW (ref 3.5–5.0)
BUN: 71 mg/dL — ABNORMAL HIGH (ref 6–20)
CO2: 24 mmol/L (ref 22–32)
Calcium: 8.8 mg/dL — ABNORMAL LOW (ref 8.9–10.3)
Chloride: 99 mmol/L — ABNORMAL LOW (ref 101–111)
Creatinine, Ser: 9.52 mg/dL — ABNORMAL HIGH (ref 0.61–1.24)
GFR, EST AFRICAN AMERICAN: 6 mL/min — AB (ref >60)
GFR, EST NON AFRICAN AMERICAN: 6 mL/min — AB (ref >60)
Glucose, Bld: 97 mg/dL (ref 65–99)
PHOSPHORUS: 7.9 mg/dL — AB (ref 2.5–4.6)
POTASSIUM: 4.3 mmol/L (ref 3.5–5.1)
Sodium: 138 mmol/L (ref 135–145)

## 2017-03-06 LAB — HEPATITIS B DNA, ULTRAQUANTITATIVE, PCR: HBV DNA SERPL PCR-LOG IU: UNDETERMINED {Log_IU}/mL

## 2017-03-06 MED ORDER — OLANZAPINE 5 MG PO TABS
10.0000 mg | ORAL_TABLET | Freq: Every day | ORAL | Status: DC
  Administered 2017-03-06 – 2017-03-13 (×8): 10 mg via ORAL
  Filled 2017-03-06 (×9): qty 2

## 2017-03-06 MED ORDER — ALUMINUM HYDROXIDE GEL 320 MG/5ML PO SUSP
30.0000 mL | Freq: Three times a day (TID) | ORAL | Status: AC
  Administered 2017-03-06 – 2017-03-08 (×6): 30 mL via ORAL
  Filled 2017-03-06 (×11): qty 30

## 2017-03-06 MED ORDER — AMLODIPINE BESYLATE 10 MG PO TABS
10.0000 mg | ORAL_TABLET | Freq: Every day | ORAL | Status: DC
  Administered 2017-03-07 – 2017-03-09 (×3): 10 mg via ORAL
  Filled 2017-03-06 (×4): qty 1

## 2017-03-06 MED ORDER — AMLODIPINE BESYLATE 5 MG PO TABS
5.0000 mg | ORAL_TABLET | Freq: Every day | ORAL | Status: DC
  Administered 2017-03-06: 5 mg via ORAL
  Filled 2017-03-06: qty 1

## 2017-03-06 NOTE — Progress Notes (Signed)
PROGRESS NOTE  Dylan [name] HDQ:[id number] DOB: 27-Jun-1965 DOA: 02/19/2017 PCP: Carlyle Basques, MD  HPI/Recap of past 26 hours: 52 year old male with history of HIV, schizophrenia who presented with a 3-week history of suicidal and homicidal ideation. He took a bunch of pills 2 days prior to admission and had been doing cocaine. He felt like he wanted to kill himself and other people. He came to the ER due to sig decreased urine output. In the ED, was found to have rhabdomyolysis with acute renal failure requiring HD which was started on 2/15. On 2/18 spiked a temp, found to have PNA. Psych is recommending inpatient psychiatric facility admission, once medically stable and the need for long term HD.  Today, pt reported feeling well, denies any new complaints, denies N/V, chest pain, SOB  Assessment/Plan: Principal Problem:   Schizoaffective disorder, depressive type ([place]) Active Problems:   HIV disease ([place])   Cocaine abuse ([place])   AKI (acute kidney injury) ([place])   Rhabdomyolysis   Suicidal overdose ([place])   Acute hepatitis   Schizoaffective disorder, bipolar type ([place])   Acute renal failure (ARF) ([place])   Suicidal ideations   Chronic 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 coma ([place])  Acute renal failure requiring HD Likely due to Rhabdomyolysis Vs med induced-->ATN  CK above 50,000. ---25,000--12,000---18,000---2,000-->1,141 Nephrology on board, continue HD prn Renal US; negative for hydronephrosis  Health care associated PNA Currently afebrile, with no leukocytosis Blood cultures X 2, NGTD  Chest x ray ??left lower lobe infiltrates VS atelectatsis IV Vanc + IV Aztreonam discontinued by ID team  Suicidal overdose  No longer suicidal Psych on board: D/C suicidal precautions, cleared. No inpt psych hospitalization needed  Started on zyprexa. monitor QTc on EKG Psych on board  HIV/Hep B ID consulted to change ART meds due to ARF HIV medications at  discharge to be determined by ID depending on need for HD  Schizoaffective disorder, depressive type ([place]) Started on Zyprexa  Polysubstance abuse Cocaine use, counseled against use   Code Status: Full  Family Communication: None at bedside  Disposition Plan: TBD   Consultants:  ID  Nephrology  Psych   Procedures:  None  Antimicrobials: None  S/P IV Vanc and IV Aztreonam   DVT prophylaxis:  Heparin   Objective: Vitals:   03/05/17 2132 03/06/17 0614 03/06/17 1050 03/06/17 1553  BP: (!) 145/90 (!) 140/95 (!) 141/75 (!) 147/86  Pulse: 81 85 83 83  Resp: 19 18 18 18   Temp: 98.2 F (36.8 C) 99 F (37.2 C) 98.1 F (36.7 C) 98.2 F (36.8 C)  TempSrc: Oral Oral Oral Oral  SpO2: 100% 99% 100% 100%  Weight:      Height:        Intake/Output Summary (Last 24 hours) at 03/06/2017 1617 Last data filed at 03/06/2017 1254 Gross per 24 hour  Intake 980 ml  Output 1400 ml  Net -420 ml   Filed Weights   03/04/17 2134 03/05/17 1310 03/05/17 1725  Weight: 105.3 kg (232 lb 2.3 oz) 101.5 kg (223 lb 12.3 oz) 101.5 kg (223 lb 12.3 oz)    Exam:   General: NAD    Cardiovascular: S1, S2 present  Respiratory: CTA  Abdomen: BS present, NT, ND, soft  Musculoskeletal: No pedal edema noted  Skin: Normal  Psychiatry: Stable mood   Data Reviewed: CBC: Recent Labs  Lab 02/28/17 0600 02/28/17 0844  WBC 5.7 5.6  HGB 10.2* 10.5*  HCT 29.5* 29.7*  MCV 88.1 88.1  PLT 142* 009*   Basic Metabolic Panel: Recent Labs  Lab 03/02/17 0524 03/03/17 0515 03/04/17 0507 03/05/17 0548 03/06/17 0648  NA 133* 134* 134* 137 138  K 4.4 4.5 4.5 4.9 4.3  CL 95* 95* 96* 98* 99*  CO2 21* 21* 21* 20* 24  GLUCOSE 96 92 89 105* 97  BUN 74* 92* 107* 118* 71*  CREATININE 11.15* 12.89* 13.89* 14.18* 9.52*  CALCIUM 7.9* 7.9* 7.9* 8.5* 8.8*  PHOS 9.1* 10.2* 10.0* 10.4* 7.9*   GFR: Estimated Creatinine Clearance: 11.7 mL/min (A) (by C-G formula based on SCr of 9.52 mg/dL  (H)). Liver Function Tests: Recent Labs  Lab 03/02/17 0524 03/03/17 0515 03/04/17 0507 03/05/17 0548 03/06/17 0648  ALBUMIN 2.5* 2.6* 2.7* 2.9* 2.8*   No results for input(s): LIPASE, AMYLASE in the last 168 hours. No results for input(s): AMMONIA in the last 168 hours. Coagulation Profile: No results for input(s): INR, PROTIME in the last 168 hours. Cardiac Enzymes: Recent Labs  Lab 02/28/17 0600 03/01/17 0403 03/02/17 0524 03/03/17 0515 03/04/17 0507  CKTOTAL 1,141* 758* 565* 482* 418*   BNP (last 3 results) No results for input(s): PROBNP in the last 8760 hours. HbA1C: No results for input(s): HGBA1C in the last 72 hours. CBG: No results for input(s): GLUCAP in the last 168 hours. Lipid Profile: No results for input(s): CHOL, HDL, LDLCALC, TRIG, CHOLHDL, LDLDIRECT in the last 72 hours. Thyroid Function Tests: No results for input(s): TSH, T4TOTAL, FREET4, T3FREE, THYROIDAB in the last 72 hours. Anemia Panel: No results for input(s): VITAMINB12, FOLATE, FERRITIN, TIBC, IRON, RETICCTPCT in the last 72 hours. Urine analysis:    Component Value Date/Time   COLORURINE AMBER (A) 02/20/2017 1100   APPEARANCEUR HAZY (A) 02/20/2017 1100   LABSPEC 1.020 02/20/2017 1100   PHURINE 5.0 02/20/2017 1100   GLUCOSEU 50 (A) 02/20/2017 1100   HGBUR LARGE (A) 02/20/2017 1100   BILIRUBINUR NEGATIVE 02/20/2017 1100   KETONESUR NEGATIVE 02/20/2017 1100   PROTEINUR 100 (A) 02/20/2017 1100   NITRITE NEGATIVE 02/20/2017 1100   LEUKOCYTESUR NEGATIVE 02/20/2017 1100   Sepsis Labs: @LABRCNTIP (procalcitonin:4,lacticidven:4)  ) Recent Results (from the past 240 hour(s))  Culture, blood (routine x 2)     Status: None   Collection Time: 02/26/17  2:02 PM  Result Value Ref Range Status   Specimen Description BLOOD LEFT ANTECUBITAL  Final   Special Requests   Final    BOTTLES DRAWN AEROBIC AND ANAEROBIC Blood Culture adequate volume   Culture   Final    NO GROWTH 5 DAYS Performed at  [facility], [place] [address], [place], [place] [address]    Report Status 03/03/2017 FINAL  Final  Culture, blood (routine x 2)     Status: None   Collection Time: 02/26/17  2:11 PM  Result Value Ref Range Status   Specimen Description BLOOD RIGHT ANTECUBITAL  Final   Special Requests   Final    BOTTLES DRAWN AEROBIC AND ANAEROBIC Blood Culture results may not be optimal due to an excessive volume of blood received in culture bottles   Culture   Final    NO GROWTH 5 DAYS Performed at [facility], [place] [address] [place],  [address]    Report Status 03/03/2017 FINAL  Final      Studies: No results found.  Scheduled Meds: . aluminum hydroxide  30 mL Oral TID  . [START ON 03/07/2017] amLODipine  10 mg Oral Daily  .  dolutegravir  50 mg Oral Daily  . feeding supplement (PRO-STAT SUGAR FREE 64)  30 mL Oral BID  . heparin injection (subcutaneous)  5,000 Units Subcutaneous Q8H  . multivitamin  1 tablet Oral QHS  . OLANZapine  10 mg Oral QHS  . tenofovir  300 mg Oral Q Tue  . zidovudine  300 mg Oral Daily    Continuous Infusions:    LOS: 15 days     Alma Friendly, MD [facility]  If 7PM-7AM, please contact night-coverage [email or web address] Password [facility] 03/06/2017, 4:17 PM

## 2017-03-06 NOTE — Consult Note (Signed)
[facility] Psych Consult Progress Note  03/06/2017 2:16 PM JAYSHON DOMMER  MRN:  [id number] Subjective:  Mr. Mcclenton reports ongoing depression but denies SI. He is unable to identify reasons why he is no longer suicidal. He reports last having suicidal thoughts two days ago. He denies HI or AVH. He reports poor sleep and good appetite.   Principal Problem: Schizoaffective disorder, depressive type ([place]) Diagnosis:   Patient Active Problem List   Diagnosis Date Noted  . Schizoaffective disorder, bipolar type ([place]) [F25.0]   . Acute renal failure (ARF) ([place]) [N17.9]   . Suicidal ideations [R45.851]   . Chronic 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 coma (HCC) [B18.1]   . AKI (acute kidney injury) ([place]) [N17.9] 02/19/2017  . Rhabdomyolysis [M62.82] 02/19/2017  . Suicidal overdose ([place]) [T50.902A] 02/19/2017  . Acute hepatitis [B17.9] 02/19/2017  . Elevated LFTs [R94.5]   . Homicidal ideations [R45.850]   . Schizoaffective disorder, depressive type ([place]) [F25.1] 09/05/2011  . Cocaine abuse ([place]) [F14.10] 09/05/2011  . Degenerative disc disease [IMO0002] 08/21/2011  . HIV disease ([place]) [B20] 08/17/2011   Total Time spent with patient: 15 minutes  Past Psychiatric History: Schizoaffective disorder and cocaine abuse  Past Medical History:  Past Medical History:  Diagnosis Date  . Arthritis   . Bipolar 1 disorder ([place])   . Depression   . Hepatitis B   . Human immunodeficiency virus (HIV) ([place])   . Schizophrenia [facility])     Past Surgical History:  Procedure Laterality Date  . IR FLUORO GUIDE CV LINE RIGHT  02/22/2017  . IR US GUIDE VASC ACCESS RIGHT  02/22/2017  . TUMOR REMOVAL     From Chest   Family History: History reviewed. No pertinent family history. Family Psychiatric  History: Mother-psychosis  Social History:  Social History   Substance and Sexual Activity  Alcohol Use No  . Alcohol/week: 0.0 oz   Comment: past     Social History   Substance and Sexual  Activity  Drug Use Yes  . Types: Cocaine   Comment: past    Social History   Socioeconomic History  . Marital status: Married    Spouse name: None  . Number of children: None  . Years of education: None  . Highest education level: None  Social Needs  . Financial resource strain: None  . Food insecurity - worry: None  . Food insecurity - inability: None  . Transportation needs - medical: None  . Transportation needs - non-medical: None  Occupational History  . None  Tobacco Use  . Smoking status: Current Every Day Smoker    Packs/day: 1.00    Years: 15.00    Pack years: 15.00    Types: Cigarettes  . Smokeless tobacco: Current User  Substance and Sexual Activity  . Alcohol use: No    Alcohol/week: 0.0 oz    Comment: past  . Drug use: Yes    Types: Cocaine    Comment: past  . Sexual activity: No  Other Topics Concern  . None  Social History Narrative  . None    Sleep: Poor  Appetite:  Good  Current Medications: Current Facility-Administered Medications  Medication Dose Route Frequency Provider Last Rate Last Dose  . acetaminophen (TYLENOL) tablet 500 mg  500 mg Oral Q6H PRN Regalado, Belkys A, MD      . albuterol (PROVENTIL) (2.5 MG/3ML) 0.083% nebulizer solution 2.5 mg  2.5 mg Nebulization Q6H PRN Phillips Grout, MD      .  aluminum hydroxide (AMPHOJEL/ALTERNAGEL) suspension 30 mL  30 mL Oral TID Elmarie Shiley, MD   30 mL at 03/06/17 0923  . amLODipine (NORVASC) tablet 5 mg  5 mg Oral Daily Alma Friendly, MD   5 mg at 03/06/17 1055  . dextromethorphan-guaiFENesin (MUCINEX DM) 30-600 MG per 12 hr tablet 1 tablet  1 tablet Oral BID PRN Vertis Kelch, NP   1 tablet at 03/02/17 2137  . dolutegravir (TIVICAY) tablet 50 mg  50 mg Oral Daily [place] Callas, NP   50 mg at 03/06/17 0924  . feeding supplement (PRO-STAT SUGAR FREE 64) liquid 30 mL  30 mL Oral BID Valentina Gu, NP   30 mL at 03/03/17 1006  . heparin injection 5,000 Units  5,000 Units  Subcutaneous Q8H Saverio Danker, PA-C   5,000 Units at 03/03/17 1325  . multivitamin (RENA-VIT) tablet 1 tablet  1 tablet Oral QHS Valentina Gu, NP   1 tablet at 03/05/17 2128  . OLANZapine (ZYPREXA) tablet 5 mg  5 mg Oral QHS Regalado, Belkys A, MD   5 mg at 03/05/17 2128  . ondansetron (ZOFRAN) injection 4 mg  4 mg Intravenous Q6H PRN Regalado, Belkys A, MD   4 mg at 02/23/17 0856  . simethicone (MYLICON) chewable tablet 80 mg  80 mg Oral Q6H PRN Regalado, Belkys A, MD   80 mg at 02/25/17 2233  . tenofovir (VIREAD) tablet 300 mg  300 mg Oral Q Tue [place]ony Callas, NP   300 mg at 03/06/17 0630  . traMADol (ULTRAM) tablet 50 mg  50 mg Oral Q8H PRN Regalado, Belkys A, MD   50 mg at 03/05/17 1740  . zidovudine (RETROVIR) capsule 300 mg  300 mg Oral Daily [name], Lavell Islam, MD   300 mg at 03/06/17 1601    Lab Results:  Results for orders placed or performed during the hospital encounter of 02/19/17 (from the past 48 hour(s))  Renal function panel     Status: Abnormal   Collection Time: 03/05/17  5:48 AM  Result Value Ref Range   Sodium 137 135 - 145 mmol/L   Potassium 4.9 3.5 - 5.1 mmol/L   Chloride 98 (L) 101 - 111 mmol/L   CO2 20 (L) 22 - 32 mmol/L   Glucose, Bld 105 (H) 65 - 99 mg/dL   BUN 118 (H) 6 - 20 mg/dL   Creatinine, Ser 14.18 (H) 0.61 - 1.24 mg/dL   Calcium 8.5 (L) 8.9 - 10.3 mg/dL   Phosphorus 10.4 (H) 2.5 - 4.6 mg/dL   Albumin 2.9 (L) 3.5 - 5.0 g/dL   GFR calc non Af Amer 3 (L) >60 mL/min   GFR calc Af Amer 4 (L) >60 mL/min    Comment: (NOTE) The eGFR has been calculated using the CKD EPI equation. This calculation has not been validated in all clinical situations. eGFR's persistently <60 mL/min signify possible Chronic Kidney Disease.    Anion gap 19 (H) 5 - 15    Comment: Performed at [facility], [place] [address], [place], [place] [address]  Renal function panel     Status: Abnormal   Collection Time: 03/06/17  6:48 AM  Result Value Ref Range    Sodium 138 135 - 145 mmol/L   Potassium 4.3 3.5 - 5.1 mmol/L   Chloride 99 (L) 101 - 111 mmol/L   CO2 24 22 - 32 mmol/L   Glucose, Bld 97 65 - 99 mg/dL   BUN  71 (H) 6 - 20 mg/dL   Creatinine, Ser 9.52 (H) 0.61 - 1.24 mg/dL   Calcium 8.8 (L) 8.9 - 10.3 mg/dL   Phosphorus 7.9 (H) 2.5 - 4.6 mg/dL   Albumin 2.8 (L) 3.5 - 5.0 g/dL   GFR calc non Af Amer 6 (L) >60 mL/min   GFR calc Af Amer 6 (L) >60 mL/min    Comment: (NOTE) The eGFR has been calculated using the CKD EPI equation. This calculation has not been validated in all clinical situations. eGFR's persistently <60 mL/min signify possible Chronic Kidney Disease.    Anion gap 15 5 - 15    Comment: Performed at [facility] [address] [place], [place] [address]    Blood Alcohol level:  Lab Results  Component Value Date   [facility] <10 02/19/2017   ETH <11 04/11/2012   Musculoskeletal: Strength & Muscle Tone: within normal limits Gait & Station: normal Patient leans: N/A  Psychiatric Specialty Exam: Physical Exam  Nursing note and vitals reviewed. Constitutional: He is oriented to person, place, and time. He appears well-developed and well-nourished.  HENT:  Head: Normocephalic and atraumatic.  Neck: Normal range of motion.  Respiratory: Effort normal.  Musculoskeletal: Normal range of motion.  Neurological: He is alert and oriented to person, place, and time.  Skin: No rash noted.  Psychiatric: His speech is normal and behavior is normal. Judgment and thought content normal. Cognition and memory are normal. He exhibits a depressed mood.    Review of Systems  Psychiatric/Behavioral: Positive for depression. Negative for hallucinations, substance abuse and suicidal ideas. The patient has insomnia. The patient is not nervous/anxious.   All other systems reviewed and are negative.   Blood pressure (!) 141/75, pulse 83, temperature 98.1 F (36.7 C), temperature source Oral, resp. rate 18, height _0  (1.88 m),  weight 101.5 kg (223 lb 12.3 oz), SpO2 100 %.Body mass index is 28.73 kg/m.  General Appearance: Well Groomed, middle aged, African American male, wearing paper hospital scrubs and sitting on a bench. NAD.   Eye Contact:  Good  Speech:  Blocked and Normal Rate  Volume:  Normal  Mood:  Depressed  Affect:  Constricted  Thought Process:  Goal Directed, Linear and Descriptions of Associations: Intact  Orientation:  Full (Time, Place, and Person)  Thought Content:  Logical  Suicidal Thoughts:  No  Homicidal Thoughts:  No  Memory:  Immediate;   Good Recent;   Good Remote;   Good  Judgement:  Fair  Insight:  Fair  Psychomotor Activity:  Normal  Concentration:  Concentration: Good and Attention Span: Good  Recall:  Good  Fund of Knowledge:  Good  Language:  Good  Akathisia:  No  Handed:  Right  AIMS (if indicated):   N/A  Assets:  Communication Skills  ADL's:  Intact  Cognition:  WNL  Sleep:   Poor   Assessment:  AKSHATH MCCAREY is a 52 y.o. male who was admitted with AKI in the setting of rhabdomyolysis secondary to cocaine use. He denies SI for the past 2 days although he reports ongoing depressed mood. He is able to safety plan and agrees to follow up with [facility] for further medication management. He denies HI or AVH. He no longer warrants inpatient psychiatric hospitalization.    Treatment Plan Summary: -Increase Zyprexa 5 mg qhs to 10 mg qhs for psychosis and insomnia.  -Patient is psychiatrically cleared. Please discontinue suicide precautions.  -Psychiatry will sign off on patient  at this time. Please consult psychiatry again as needed.   Faythe Dingwall, DO 03/06/2017, 2:16 PM

## 2017-03-06 NOTE — Progress Notes (Signed)
Subjective:  Reports to be feeling better and tolerated hemodialysis yesterday without problems  Objective Vital signs in last 24 hours: Vitals:   03/05/17 1906 03/05/17 2132 03/06/17 0614 03/06/17 1050  BP: (!) 149/86 (!) 145/90 (!) 140/95 (!) 141/75  Pulse: 86 81 85 83  Resp: 18 19 18 18   Temp: 98 F (36.7 C) 98.2 F (36.8 C) 99 F (37.2 C) 98.1 F (36.7 C)  TempSrc: Oral Oral Oral Oral  SpO2: 100% 100% 99% 100%  Weight:      Height:       Weight change: -3.8 kg (-6 oz)  Physical Exam: General: Comfortably sitting up in recliner, just came from ambulating around hallway Heart: Pulse regular rhythm, normal rate, S1 and S2 normal Lungs: Clear to auscultation, no rales/rhonchi Abdomen: Soft, obese, nontender Extremities: Dialysis Access: Right IJ temporary hemodialysis catheter   Problem/Plan: 1. Acute kidney injury: Secondary to rhabdomyolysis/pigment associated nephropathy. Urine output nonoliguric with labs continued to show a lack of renal recovery as yet. Underwent hemodialysis yesterday because of development of subtle uremic symptoms. No acute indications for dialysis today. 2. Healthcare associated pneumonia - he is status post completion of intravenous vancomycin and aztreonam and remains afebrile without any hypoxia/cough. 3. HIV infection - on antiretroviral therapy 4. Hyperphosphatemia - secondary to acute kidney injury/impaired phosphorus excretion-started on aluminum hydroxide 5. Hypertension - blood pressure mildly elevated, continue to monitor to decide on the need for anti-hypertensive therapy titration. 6. Schizoaffective disorder with suicidal ideation: Sitter at bedside with suicidal precautions  Labs: Basic Metabolic Panel: Recent Labs  Lab 03/04/17 0507 03/05/17 0548 03/06/17 0648  NA 134* 137 138  K 4.5 4.9 4.3  CL 96* 98* 99*  CO2 21* 20* 24  GLUCOSE 89 105* 97  BUN 107* 118* 71*  CREATININE 13.89* 14.18* 9.52*  CALCIUM 7.9* 8.5* 8.8*  PHOS  10.0* 10.4* 7.9*   Liver Function Tests: Recent Labs  Lab 03/04/17 0507 03/05/17 0548 03/06/17 0648  ALBUMIN 2.7* 2.9* 2.8*   No results for input(s): LIPASE, AMYLASE in the last 168 hours. No results for input(s): AMMONIA in the last 168 hours. CBC: Recent Labs  Lab 02/28/17 0600 02/28/17 0844  WBC 5.7 5.6  HGB 10.2* 10.5*  HCT 29.5* 29.7*  MCV 88.1 88.1  PLT 142* 145*   Cardiac Enzymes: Recent Labs  Lab 02/28/17 0600 03/01/17 0403 03/02/17 0524 03/03/17 0515 03/04/17 0507  CKTOTAL 1,141* 758* 565* 482* 418*   CBG: No results for input(s): GLUCAP in the last 168 hours.  Studies/Results: No results found. Medications:  . aluminum hydroxide  30 mL Oral TID  . amLODipine  5 mg Oral Daily  . dolutegravir  50 mg Oral Daily  . feeding supplement (PRO-STAT SUGAR FREE 64)  30 mL Oral BID  . heparin injection (subcutaneous)  5,000 Units Subcutaneous Q8H  . multivitamin  1 tablet Oral QHS  . OLANZapine  5 mg Oral QHS  . tenofovir  300 mg Oral Q Tue  . zidovudine  300 mg Oral Daily    Elmarie Shiley MD [facility]. Office # [phone] Pager # [phone] 12:27 PM

## 2017-03-06 NOTE — Clinical Social Work Note (Signed)
Patient re-evaluated by psychiatrist and per her evaluation of patient, "He denies HI or AVH. He no longer warrants inpatient psychiatric hospitalization". It was also determined that patient no longer needs a suicide sitter. Notice of comrmitment change completed, signed by MD and faxed to [place].  Allecia Bells Givens, MSW, LCSW Licensed Clinical Social Worker [facility] [phone]

## 2017-03-07 DIAGNOSIS — N186 End stage renal disease: Secondary | ICD-10-CM

## 2017-03-07 DIAGNOSIS — Z992 Dependence on renal dialysis: Secondary | ICD-10-CM

## 2017-03-07 LAB — RENAL FUNCTION PANEL
ALBUMIN: 3.1 g/dL — AB (ref 3.5–5.0)
ANION GAP: 13 (ref 5–15)
BUN: 81 mg/dL — ABNORMAL HIGH (ref 6–20)
CALCIUM: 9.2 mg/dL (ref 8.9–10.3)
CO2: 24 mmol/L (ref 22–32)
Chloride: 101 mmol/L (ref 101–111)
Creatinine, Ser: 9.68 mg/dL — ABNORMAL HIGH (ref 0.61–1.24)
GFR calc non Af Amer: 5 mL/min — ABNORMAL LOW (ref >60)
GFR, EST AFRICAN AMERICAN: 6 mL/min — AB (ref >60)
Glucose, Bld: 107 mg/dL — ABNORMAL HIGH (ref 65–99)
PHOSPHORUS: 7.8 mg/dL — AB (ref 2.5–4.6)
Potassium: 4.6 mmol/L (ref 3.5–5.1)
Sodium: 138 mmol/L (ref 135–145)

## 2017-03-07 MED ORDER — HEPARIN SODIUM (PORCINE) 1000 UNIT/ML DIALYSIS: 40.0000 [IU]/kg | INTRAMUSCULAR | Status: DC | PRN

## 2017-03-07 NOTE — Procedures (Signed)
Patient seen on Hemodialysis. QB 400, UF goal keeping even Treatment adjusted as needed.  Elmarie Shiley MD [facility]. Office # [phone] Pager # [phone] 2:41 PM

## 2017-03-07 NOTE — Consult Note (Signed)
[facility] [place] HISTORY AND PHYSICAL   History of Present Illness:  Patient is a 52 y.o. year old male who presents for placement of a permanent hemodialysis access. The patient is right handed.  The patient is currently on hemodialysis. He is using a temporary catheter The cause of renal failure is thought to be secondary to rhabdo.  Other chronic medical problems include HIV and pneumonia both of which are stable.  He denies history of IV drug abuse.  Past Medical History:  Diagnosis Date  . Arthritis   . Bipolar 1 disorder ([place])   . Depression   . Hepatitis B   . Human immunodeficiency virus (HIV) ([place])   . Schizophrenia [facility])     Past Surgical History:  Procedure Laterality Date  . IR FLUORO GUIDE CV LINE RIGHT  02/22/2017  . IR US GUIDE VASC ACCESS RIGHT  02/22/2017  . TUMOR REMOVAL     From Chest     Social History Social History   Tobacco Use  . Smoking status: Current Every Day Smoker    Packs/day: 1.00    Years: 15.00    Pack years: 15.00    Types: Cigarettes  . Smokeless tobacco: Current User  Substance Use Topics  . Alcohol use: No    Alcohol/week: 0.0 oz    Comment: past  . Drug use: Yes    Types: Cocaine    Comment: past    Family History History reviewed. No pertinent family history.  Allergies  Allergies  Allergen Reactions  . Penicillins Other (See Comments)    Childhood allergy  . Latex Rash  . Tape Rash     Current Facility-Administered Medications  Medication Dose Route Frequency Provider Last Rate Last Dose  . acetaminophen (TYLENOL) tablet 500 mg  500 mg Oral Q6H PRN Regalado, Belkys A, MD      . albuterol (PROVENTIL) (2.5 MG/3ML) 0.083% nebulizer solution 2.5 mg  2.5 mg Nebulization Q6H PRN Derrill Kay A, MD      . aluminum hydroxide (AMPHOJEL/ALTERNAGEL) suspension 30 mL  30 mL Oral TID Elmarie Shiley, MD   30 mL at 03/07/17 1102  . amLODipine (NORVASC) tablet 10 mg  10 mg Oral Daily Alma Friendly, MD    10 mg at 03/07/17 1103  . dextromethorphan-guaiFENesin (MUCINEX DM) 30-600 MG per 12 hr tablet 1 tablet  1 tablet Oral BID PRN Vertis Kelch, NP   1 tablet at 03/02/17 2137  . dolutegravir (TIVICAY) tablet 50 mg  50 mg Oral Daily [place] Callas, NP   50 mg at 03/07/17 1053  . feeding supplement (PRO-STAT SUGAR FREE 64) liquid 30 mL  30 mL Oral BID Valentina Gu, NP   30 mL at 03/07/17 1053  . heparin injection 5,000 Units  5,000 Units Subcutaneous Q8H Saverio Danker, PA-C   5,000 Units at 03/03/17 1325  . multivitamin (RENA-VIT) tablet 1 tablet  1 tablet Oral QHS Valentina Gu, NP   1 tablet at 03/06/17 2222  . OLANZapine (ZYPREXA) tablet 10 mg  10 mg Oral QHS Alma Friendly, MD   10 mg at 03/06/17 2222  . ondansetron (ZOFRAN) injection 4 mg  4 mg Intravenous Q6H PRN Regalado, Belkys A, MD   4 mg at 02/23/17 0856  . simethicone (MYLICON) chewable tablet 80 mg  80 mg Oral Q6H PRN Regalado, Belkys A, MD   80 mg at 02/25/17 2233  . tenofovir (VIREAD) tablet 300 mg  300 mg  Oral Q Hansel Feinstein, NP   300 mg at 03/06/17 0076  . traMADol (ULTRAM) tablet 50 mg  50 mg Oral Q8H PRN Regalado, Belkys A, MD   50 mg at 03/05/17 1740  . zidovudine (RETROVIR) capsule 300 mg  300 mg Oral Daily [name], Lavell Islam, MD   300 mg at 03/07/17 1053    ROS:   General:  No weight loss, Fever, chills  HEENT: No recent headaches, no nasal bleeding, no visual changes, no sore throat  Neurologic: No dizziness, blackouts, seizures. No recent symptoms of stroke or mini- stroke. No recent episodes of slurred speech, or temporary blindness.  Cardiac: No recent episodes of chest pain/pressure, no shortness of breath at rest.  + shortness of breath with exertion.  Denies history of atrial fibrillation or irregular heartbeat  Vascular: No history of rest pain in feet.  No history of claudication.  No history of non-healing ulcer, No history of DVT   Pulmonary: No home oxygen, no  productive cough, no hemoptysis,  No asthma or wheezing  Musculoskeletal:  [ ]  Arthritis, [ ]  Low back pain,  [ ]  Joint pain  Hematologic:No history of hypercoagulable state.  No history of easy bleeding.  No history of anemia  Gastrointestinal: No hematochezia or melena,  No gastroesophageal reflux, no trouble swallowing  Urinary: [ ]  chronic Kidney disease, [ x] on HD - [ ]  MWF or [ ]  TTHS, [ ]  Burning with urination, [ ]  Frequent urination, [ ]  Difficulty urinating;   Skin: No rashes  Psychological: No history of anxiety,  No history of depression, awaiting psych admission when medically stable   Physical Examination  Vitals:   03/07/17 1410 03/07/17 1415 03/07/17 1500 03/07/17 1530  BP: 139/80 134/83 137/83 136/86  Pulse: 87 86 85 85  Resp: 15 16 16 15   Temp:      TempSrc:      SpO2:      Weight:      Height:        Body mass index is 27.43 kg/m.  General:  Alert and oriented, no acute distress HEENT: Normal Neck: No JVD Pulmonary: Clear to auscultation bilaterally Cardiac: Regular Rate and Rhythm  Gastrointestinal: Soft, non-tender, non-distended, no mass Skin: No rash Extremity Pulses:  2+ radial, brachial pulses bilaterally, IV left forearm Musculoskeletal: No deformity or edema  Neurologic: Upper and lower extremity motor 5/5 and symmetric  DATA:  CBC    Component Value Date/Time   WBC 5.6 02/28/2017 0844   RBC 3.37 (L) 02/28/2017 0844   HGB 10.5 (L) 02/28/2017 0844   HCT 29.7 (L) 02/28/2017 0844   PLT 145 (L) 02/28/2017 0844   MCV 88.1 02/28/2017 0844   MCH 31.2 02/28/2017 0844   MCHC 35.4 02/28/2017 0844   RDW 12.8 02/28/2017 0844   LYMPHSABS 1.8 02/19/2017 1742   MONOABS 1.8 (H) 02/19/2017 1742   EOSABS 0.0 02/19/2017 1742   BASOSABS 0.0 02/19/2017 1742    BMET    Component Value Date/Time   NA 138 03/07/2017 0524   K 4.6 03/07/2017 0524   CL 101 03/07/2017 0524   CO2 24 03/07/2017 0524   GLUCOSE 107 (H) 03/07/2017 0524   BUN 81 (H)  03/07/2017 0524   CREATININE 9.68 (H) 03/07/2017 0524   CREATININE 0.84 03/26/2012 1047   CALCIUM 9.2 03/07/2017 0524   GFRNONAA 5 (L) 03/07/2017 0524   GFRNONAA >89 03/26/2012 1047   GFRAA 6 (L) 03/07/2017 0524   GFRAA >  89 03/26/2012 1047     ASSESSMENT: Pt needs long term HD access and tunneled catheter.  Vein map is pending.  Most likely will use left arm.  Will remove IVs   PLAN:  Left arm Access and tunneled catheter Tuesday 03/13/17  Ruta Hinds, MD [facility] [place] Office: [phone] Pager: [phone]

## 2017-03-07 NOTE — Progress Notes (Signed)
Patient ID: Dylan [name], male   DOB: Aug 17, 1965, 52 y.o.   MRN: [id number] [facility] Progress Note   Assessment/ Plan:   1.  Acute kidney injury: Secondary to rhabdomyolysis/pigment associated nephropathy.  Charted UOP likely inaccurate per patient but labs continue to show persistent AKI without renal recovery yet. Plan for HD today (without UF) for clearance and continued monitoring for recovery.  2.  Healthcare associated pneumonia: Status post completion of intravenous vancomycin and aztreonam-stop date was 03/01/17.  Currently remains afebrile and without cough/shortness of breath. 3.  HIV infection: On antiretroviral therapy, management/adjustments per primary service and infectious diseases. 4.  Schizoaffective disorder: On Zyprexa and currently with a sitter by bedside awaiting medical stability prior to transfer to inpatient psychiatry unit. 5.  Hyperphosphatemia: Secondary to acute kidney injury, continue aluminum hydroxide -3 day course to stop today vs tomorrow.  Subjective:   Denies any chest pain or shortness of breath, some problems with poor sleep overnight and mild dysgeusia noted.   Objective:   BP 139/86 (BP Location: Right Arm)   Pulse 84   Temp 98 F (36.7 C) (Oral)   Resp 18   Ht 6\' 2"  (1.88 m)   Wt 101.3 kg (223 lb 5.2 oz)   SpO2 100%   BMI 28.67 kg/m   Intake/Output Summary (Last 24 hours) at 03/07/2017 1050 Last data filed at 03/07/2017 0612 Gross per 24 hour  Intake 700 ml  Output 700 ml  Net 0 ml   Weight change: -0.2 kg (-7.1 oz)  Physical Exam: Gen: Comfortably resting in bed, watching television.   CVS: Pulse regular rhythm, normal rate, S1 and S2 normal Resp: Clear to auscultation bilaterally, no audible rales or rhonchi Abd: Soft, flat, nontender Ext: No lower extremity edema, right IJ temporary catheter in place.  Imaging: No results found.  Labs: BMET Recent Labs  Lab 03/01/17 0403 03/02/17 0488 03/03/17 0515  03/04/17 0507 03/05/17 0548 03/06/17 0648 03/07/17 0524  NA 133* 133* 134* 134* 137 138 138  K 4.3 4.4 4.5 4.5 4.9 4.3 4.6  CL 95* 95* 95* 96* 98* 99* 101  CO2 23 21* 21* 21* 20* 24 24  GLUCOSE 99 96 92 89 105* 97 107*  BUN 54* 74* 92* 107* 118* 71* 81*  CREATININE 8.76* 11.15* 12.89* 13.89* 14.18* 9.52* 9.68*  CALCIUM 7.8* 7.9* 7.9* 7.9* 8.5* 8.8* 9.2  PHOS 7.1* 9.1* 10.2* 10.0* 10.4* 7.9* 7.8*   CBC No results for input(s): WBC, NEUTROABS, HGB, HCT, MCV, PLT in the last 168 hours.  Medications:    . aluminum hydroxide  30 mL Oral TID  . amLODipine  10 mg Oral Daily  . dolutegravir  50 mg Oral Daily  . feeding supplement (PRO-STAT SUGAR FREE 64)  30 mL Oral BID  . heparin injection (subcutaneous)  5,000 Units Subcutaneous Q8H  . multivitamin  1 tablet Oral QHS  . OLANZapine  10 mg Oral QHS  . tenofovir  300 mg Oral Q Tue  . zidovudine  300 mg Oral Daily   Elmarie Shiley, MD 03/07/2017, 10:50 AM

## 2017-03-07 NOTE — Progress Notes (Signed)
PROGRESS NOTE    Dylan [name]  QQV:[id number] DOB: [birth year] DOA: 02/19/2017 PCP: Carlyle Basques, MD     Brief Narrative:  Dylan [name] is a 52 year old male with history of HIV, schizophrenia who presented with a 3-week history of suicidal and homicidal ideation. He took a bunch of pills 2 days prior to admission and had been doing cocaine. He felt like he wanted to kill himself and other people. He came to the ER due to significantly decreased urine output. In the ED, he was found to have rhabdomyolysis with acute renal failure requiring HD which was started on 2/15. On 2/18, he spiked a fever and was found to have pneumonia. He has been evaluated by infectious disease for HIV medication. Psych was consulted and patient now cleared from psych standpoint.   Assessment & Plan:   Principal Problem:   AKI (acute kidney injury) ([place]) Active Problems:   HIV disease ([place])   Schizoaffective disorder, depressive type ([place])   Cocaine abuse ([place])   Rhabdomyolysis   Suicidal overdose ([place])   Acute hepatitis   Schizoaffective disorder, bipolar type ([place])   Acute renal failure (ARF) ([place])   Suicidal ideations   Chronic 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 coma ([place])   Acute renal failure requiring HD -Secondary to rhabdomyolysis/pigment associated nephropathy  -CK above 50,000--> 25,000--> 12,000--> 18,000--> 2,000--> 1,141 -Renal US; negative for hydronephrosis -Nephrology on board, continue HD   Healthcare associated pneumonia -Completed IV Vanc + IV Aztreonam  Suicidal ideation / Schizoaffective disorder -Started on zyprexa. monitor QTc on EKG -Psych consulted and patient is cleared   HIV/Chronic hep B -Now on tenofovir DF weekly, dolutegravir daily, zidovudine daily, stop lamivudine  -ID following   HTN -Continue amlodipine   Polysubstance abuse -Cocaine use, counseled against use    DVT prophylaxis: subq hep Code Status: full Family  Communication: no family at bedside Disposition Plan: pending improvement   Consultants:   ID  Nephrology   Procedures:   None   Antimicrobials:  Anti-infectives (From admission, onward)   Start     Dose/Rate Route Frequency Ordered Stop   03/06/17 1000  tenofovir (VIREAD) tablet 300 mg     300 mg Oral Every Tue 03/05/17 1204     03/06/17 1000  dolutegravir (TIVICAY) tablet 50 mg     50 mg Oral Daily 03/05/17 1344     02/27/17 1200  vancomycin (VANCOCIN) IVPB 1000 mg/200 mL premix  Status:  Discontinued     1,000 mg 200 mL/hr over 60 Minutes Intravenous Every Tue (Hemodialysis) 02/27/17 1034 02/27/17 1055   02/27/17 1200  vancomycin (VANCOCIN) IVPB 1000 mg/200 mL premix  Status:  Discontinued     1,000 mg 200 mL/hr over 60 Minutes Intravenous Every T-Th-Sa (Hemodialysis) 02/27/17 1055 03/01/17 1110   02/26/17 1500  aztreonam (AZACTAM) 500 mg in dextrose 5 % 50 mL IVPB  Status:  Discontinued     500 mg 100 mL/hr over 30 Minutes Intravenous Every 12 hours 02/26/17 1418 03/01/17 1110   02/26/17 1400  aztreonam (AZACTAM) 2 g in sodium chloride 0.9 % 100 mL IVPB  Status:  Discontinued     2 g 200 mL/hr over 30 Minutes Intravenous Every 8 hours 02/26/17 1349 02/26/17 1419   02/26/17 1400  vancomycin (VANCOCIN) 2,000 mg in sodium chloride 0.9 % 500 mL IVPB     2,000 mg 250 mL/hr over 120 Minutes Intravenous  Once 02/26/17 1403 02/26/17 2252   02/22/17 1000  bictegravir-emtricitabine-tenofovir  AF (BIKTARVY) 50-200-25 MG per tablet 1 tablet  Status:  Discontinued     1 tablet Oral Daily 02/21/17 1418 02/21/17 1618   02/22/17 1000  zidovudine (RETROVIR) capsule 300 mg     300 mg Oral Daily 02/21/17 1951     02/22/17 1000  lamiVUDine (EPIVIR) 10 MG/ML solution 50 mg  Status:  Discontinued     50 mg Oral Daily 02/21/17 1951 03/05/17 1204   02/22/17 0800  dolutegravir (TIVICAY) tablet 50 mg  Status:  Discontinued     50 mg Oral Daily with breakfast 02/21/17 1619 02/21/17 1948    02/22/17 0800  darunavir-cobicistat (PREZCOBIX) 800-150 MG per tablet 1 tablet  Status:  Discontinued     1 tablet Oral Daily with breakfast 02/21/17 1619 03/05/17 1344   02/20/17 1700  bictegravir-emtricitabine-tenofovir AF (BIKTARVY) 50-200-25 MG per tablet 1 tablet  Status:  Discontinued     1 tablet Oral Daily 02/20/17 1503 02/21/17 1242       Subjective: No complaints this morning. Feeling well overall. No chest pain, shortness of breath.   Objective: Vitals:   03/06/17 1553 03/06/17 2045 03/07/17 0605 03/07/17 0900  BP: (!) 147/86 (!) 147/91 138/87 139/86  Pulse: 83 87 81 84  Resp: 18 19 17 18   Temp: 98.2 F (36.8 C) 98.6 F (37 C) 98.1 F (36.7 C) 98 F (36.7 C)  TempSrc: Oral Oral Oral Oral  SpO2: 100% 99% 99% 100%  Weight:  101.3 kg (223 lb 5.2 oz)    Height:        Intake/Output Summary (Last 24 hours) at 03/07/2017 1213 Last data filed at 03/07/2017 0612 Gross per 24 hour  Intake 600 ml  Output 0 ml  Net 600 ml   Filed Weights   03/05/17 1310 03/05/17 1725 03/06/17 2045  Weight: 101.5 kg (223 lb 12.3 oz) 101.5 kg (223 lb 12.3 oz) 101.3 kg (223 lb 5.2 oz)    Examination:  General exam: Appears calm and comfortable  Respiratory system: Clear to auscultation. Respiratory effort normal. Cardiovascular system: S1 & S2 heard, RRR. No JVD, murmurs, rubs, gallops or clicks. No pedal edema. Gastrointestinal system: Abdomen is nondistended, soft and nontender. No organomegaly or masses felt. Normal bowel sounds heard. Central nervous system: Alert and oriented. No focal neurological deficits. Extremities: Symmetric 5 x 5 power. Skin: No rashes, lesions or ulcers Psychiatry: Judgement and insight appear stable. Flat affect.   Data Reviewed: I have personally reviewed following labs and imaging studies  CBC: No results for input(s): WBC, NEUTROABS, HGB, HCT, MCV, PLT in the last 168 hours. Basic Metabolic Panel: Recent Labs  Lab 03/03/17 0515 03/04/17 0507  03/05/17 0548 03/06/17 0648 03/07/17 0524  NA 134* 134* 137 138 138  K 4.5 4.5 4.9 4.3 4.6  CL 95* 96* 98* 99* 101  CO2 21* 21* 20* 24 24  GLUCOSE 92 89 105* 97 107*  BUN 92* 107* 118* 71* 81*  CREATININE 12.89* 13.89* 14.18* 9.52* 9.68*  CALCIUM 7.9* 7.9* 8.5* 8.8* 9.2  PHOS 10.2* 10.0* 10.4* 7.9* 7.8*   GFR: Estimated Creatinine Clearance: 11.5 mL/min (A) (by C-G formula based on SCr of 9.68 mg/dL (H)). Liver Function Tests: Recent Labs  Lab 03/03/17 0515 03/04/17 0507 03/05/17 0548 03/06/17 0648 03/07/17 0524  ALBUMIN 2.6* 2.7* 2.9* 2.8* 3.1*   No results for input(s): LIPASE, AMYLASE in the last 168 hours. No results for input(s): AMMONIA in the last 168 hours. Coagulation Profile: No results for input(s): INR, PROTIME in  the last 168 hours. Cardiac Enzymes: Recent Labs  Lab 03/01/17 0403 03/02/17 0524 03/03/17 0515 03/04/17 0507  CKTOTAL 758* 565* 482* 418*   BNP (last 3 results) No results for input(s): PROBNP in the last 8760 hours. HbA1C: No results for input(s): HGBA1C in the last 72 hours. CBG: No results for input(s): GLUCAP in the last 168 hours. Lipid Profile: No results for input(s): CHOL, HDL, LDLCALC, TRIG, CHOLHDL, LDLDIRECT in the last 72 hours. Thyroid Function Tests: No results for input(s): TSH, T4TOTAL, FREET4, T3FREE, THYROIDAB in the last 72 hours. Anemia Panel: No results for input(s): VITAMINB12, FOLATE, FERRITIN, TIBC, IRON, RETICCTPCT in the last 72 hours. Sepsis Labs: No results for input(s): PROCALCITON, LATICACIDVEN in the last 168 hours.  Recent Results (from the past 240 hour(s))  Culture, blood (routine x 2)     Status: None   Collection Time: 02/26/17  2:02 PM  Result Value Ref Range Status   Specimen Description BLOOD LEFT ANTECUBITAL  Final   Special Requests   Final    BOTTLES DRAWN AEROBIC AND ANAEROBIC Blood Culture adequate volume   Culture   Final    NO GROWTH 5 DAYS Performed at [facility], [address], [place], [place] [address]    Report Status 03/03/2017 FINAL  Final  Culture, blood (routine x 2)     Status: None   Collection Time: 02/26/17  2:11 PM  Result Value Ref Range Status   Specimen Description BLOOD RIGHT ANTECUBITAL  Final   Special Requests   Final    BOTTLES DRAWN AEROBIC AND ANAEROBIC Blood Culture results may not be optimal due to an excessive volume of blood received in culture bottles   Culture   Final    NO GROWTH 5 DAYS Performed at [facility], [place] [address], [place], [place] [address]    Report Status 03/03/2017 FINAL  Final       Radiology Studies: No results found.    Scheduled Meds: . aluminum hydroxide  30 mL Oral TID  . amLODipine  10 mg Oral Daily  . dolutegravir  50 mg Oral Daily  . feeding supplement (PRO-STAT SUGAR FREE 64)  30 mL Oral BID  . heparin injection (subcutaneous)  5,000 Units Subcutaneous Q8H  . multivitamin  1 tablet Oral QHS  . OLANZapine  10 mg Oral QHS  . tenofovir  300 mg Oral Q Tue  . zidovudine  300 mg Oral Daily   Continuous Infusions:   LOS: 16 days    Time spent: 40 minutes   Dessa Phi, DO [facility] [email or web address] Password [facility] 03/07/2017, 12:13 PM

## 2017-03-08 ENCOUNTER — Inpatient Hospital Stay (HOSPITAL_COMMUNITY): Payer: Self-pay

## 2017-03-08 ENCOUNTER — Telehealth: Payer: Self-pay | Admitting: Pharmacist Clinician (PhC)/ Clinical Pharmacy Specialist

## 2017-03-08 DIAGNOSIS — N186 End stage renal disease: Secondary | ICD-10-CM

## 2017-03-08 DIAGNOSIS — Z992 Dependence on renal dialysis: Secondary | ICD-10-CM

## 2017-03-08 LAB — RENAL FUNCTION PANEL
ANION GAP: 12 (ref 5–15)
Albumin: 3.1 g/dL — ABNORMAL LOW (ref 3.5–5.0)
BUN: 50 mg/dL — ABNORMAL HIGH (ref 6–20)
CHLORIDE: 100 mmol/L — AB (ref 101–111)
CO2: 26 mmol/L (ref 22–32)
Calcium: 9.3 mg/dL (ref 8.9–10.3)
Creatinine, Ser: 6.43 mg/dL — ABNORMAL HIGH (ref 0.61–1.24)
GFR calc non Af Amer: 9 mL/min — ABNORMAL LOW (ref >60)
GFR, EST AFRICAN AMERICAN: 10 mL/min — AB (ref >60)
Glucose, Bld: 129 mg/dL — ABNORMAL HIGH (ref 65–99)
Phosphorus: 5.3 mg/dL — ABNORMAL HIGH (ref 2.5–4.6)
Potassium: 3.9 mmol/L (ref 3.5–5.1)
Sodium: 138 mmol/L (ref 135–145)

## 2017-03-08 LAB — CBC
HCT: 31.8 % — ABNORMAL LOW (ref 39.0–52.0)
Hemoglobin: 10.6 g/dL — ABNORMAL LOW (ref 13.0–17.0)
MCH: 29.9 pg (ref 26.0–34.0)
MCHC: 33.3 g/dL (ref 30.0–36.0)
MCV: 89.8 fL (ref 78.0–100.0)
Platelets: 155 10*3/uL (ref 150–400)
RBC: 3.54 MIL/uL — AB (ref 4.22–5.81)
RDW: 12.7 % (ref 11.5–15.5)
WBC: 6.2 10*3/uL (ref 4.0–10.5)

## 2017-03-08 NOTE — Progress Notes (Signed)
PROGRESS NOTE    Dylan [name]  FWY:[id number] DOB: 1965-09-17 DOA: 02/19/2017 PCP: Carlyle Basques, MD     Brief Narrative:  Dylan [name] is a 52 year old male with history of HIV, schizophrenia who presented with a 3-week history of suicidal and homicidal ideation. He took a bunch of pills 2 days prior to admission and had been doing cocaine. He felt like he wanted to kill himself and other people. He came to the ER due to significantly decreased urine output. In the ED, he was found to have rhabdomyolysis with acute renal failure requiring HD which was started on 2/15. On 2/18, he spiked a fever and was found to have pneumonia. He has been evaluated by infectious disease for HIV medication. Psych was consulted and patient now cleared from psych standpoint.   Assessment & Plan:   Principal Problem:   AKI (acute kidney injury) ([place]) Active Problems:   HIV disease ([place])   Schizoaffective disorder, depressive type ([place])   Cocaine abuse ([place])   Rhabdomyolysis   Suicidal overdose ([place])   Acute hepatitis   Schizoaffective disorder, bipolar type ([place])   Acute renal failure (ARF) ([place])   Suicidal ideations   Chronic 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 coma ([place])   Acute renal failure requiring HD -Secondary to rhabdomyolysis/pigment associated nephropathy  -CK above 50,000--> 25,000--> 12,000--> 18,000--> 2,000--> 1,141 -Renal US; negative for hydronephrosis -Nephrology on board, continue HD  -Vascular surgery planning for tunneled catheter and left arm AV fistula 3/4 with Dr. Bridgett Larsson   Healthcare associated pneumonia -Completed IV Vanc + IV Aztreonam  Suicidal ideation / Schizoaffective disorder -Started on zyprexa. monitor QTc on EKG -Psych consulted and patient is cleared   HIV/Chronic hep B -Now on tenofovir DF weekly, dolutegravir daily, zidovudine daily, stop lamivudine  -ID following   HTN -Continue amlodipine   Polysubstance abuse -Cocaine use,  counseled against use    DVT prophylaxis: subq hep Code Status: full Family Communication: no family at bedside, called sister without answer, left voicemail  Disposition Plan: pending surgical procedure next week    Consultants:   ID  Nephrology   Vascular surgery   Procedures:   None   Antimicrobials:  Anti-infectives (From admission, onward)   Start     Dose/Rate Route Frequency Ordered Stop   03/06/17 1000  tenofovir (VIREAD) tablet 300 mg     300 mg Oral Every Tue 03/05/17 1204     03/06/17 1000  dolutegravir (TIVICAY) tablet 50 mg     50 mg Oral Daily 03/05/17 1344     02/27/17 1200  vancomycin (VANCOCIN) IVPB 1000 mg/200 mL premix  Status:  Discontinued     1,000 mg 200 mL/hr over 60 Minutes Intravenous Every Tue (Hemodialysis) 02/27/17 1034 02/27/17 1055   02/27/17 1200  vancomycin (VANCOCIN) IVPB 1000 mg/200 mL premix  Status:  Discontinued     1,000 mg 200 mL/hr over 60 Minutes Intravenous Every T-Th-Sa (Hemodialysis) 02/27/17 1055 03/01/17 1110   02/26/17 1500  aztreonam (AZACTAM) 500 mg in dextrose 5 % 50 mL IVPB  Status:  Discontinued     500 mg 100 mL/hr over 30 Minutes Intravenous Every 12 hours 02/26/17 1418 03/01/17 1110   02/26/17 1400  aztreonam (AZACTAM) 2 g in sodium chloride 0.9 % 100 mL IVPB  Status:  Discontinued     2 g 200 mL/hr over 30 Minutes Intravenous Every 8 hours 02/26/17 1349 02/26/17 1419   02/26/17 1400  vancomycin (VANCOCIN) 2,000 mg in  sodium chloride 0.9 % 500 mL IVPB     2,000 mg 250 mL/hr over 120 Minutes Intravenous  Once 02/26/17 1403 02/26/17 2252   02/22/17 1000  bictegravir-emtricitabine-tenofovir AF (BIKTARVY) 50-200-25 MG per tablet 1 tablet  Status:  Discontinued     1 tablet Oral Daily 02/21/17 1418 02/21/17 1618   02/22/17 1000  zidovudine (RETROVIR) capsule 300 mg     300 mg Oral Daily 02/21/17 1951     02/22/17 1000  lamiVUDine (EPIVIR) 10 MG/ML solution 50 mg  Status:  Discontinued     50 mg Oral Daily 02/21/17  1951 03/05/17 1204   02/22/17 0800  dolutegravir (TIVICAY) tablet 50 mg  Status:  Discontinued     50 mg Oral Daily with breakfast 02/21/17 1619 02/21/17 1948   02/22/17 0800  darunavir-cobicistat (PREZCOBIX) 800-150 MG per tablet 1 tablet  Status:  Discontinued     1 tablet Oral Daily with breakfast 02/21/17 1619 03/05/17 1344   02/20/17 1700  bictegravir-emtricitabine-tenofovir AF (BIKTARVY) 50-200-25 MG per tablet 1 tablet  Status:  Discontinued     1 tablet Oral Daily 02/20/17 1503 02/21/17 1242       Subjective: Doing well today. Complained of left toes being numb. No chest pain, SOB   Objective: Vitals:   03/07/17 1810 03/07/17 2032 03/08/17 0451 03/08/17 0900  BP: 132/81 127/77 123/71 132/65  Pulse: 83 99 83 88  Resp: 17 16 16 17   Temp: 98.4 F (36.9 C) 99 F (37.2 C) 98.8 F (37.1 C) 98.9 F (37.2 C)  TempSrc: Oral Oral Oral Oral  SpO2: 95% 97% 97% 99%  Weight: 96.9 kg (213 lb 10 oz) 96.9 kg (213 lb 10 oz)    Height:        Intake/Output Summary (Last 24 hours) at 03/08/2017 1103 Last data filed at 03/08/2017 0900 Gross per 24 hour  Intake 720 ml  Output 1050 ml  Net -330 ml   Filed Weights   03/07/17 1400 03/07/17 1810 03/07/17 2032  Weight: 96.9 kg (213 lb 10 oz) 96.9 kg (213 lb 10 oz) 96.9 kg (213 lb 10 oz)    Examination: General exam: Appears calm and comfortable  Respiratory system: Clear to auscultation. Respiratory effort normal. Cardiovascular system: S1 & S2 heard, RRR. No JVD, murmurs, rubs, gallops or clicks. No pedal edema. Gastrointestinal system: Abdomen is nondistended, soft and nontender. No organomegaly or masses felt. Normal bowel sounds heard. Central nervous system: Alert and oriented. No focal neurological deficits. Extremities: Symmetric 5 x 5 power. Left foot examined, palpable pulses, warm and dry without lesions  Skin: No rashes, lesions or ulcers Psychiatry: Judgement and insight appear normal. Mood & affect appropriate.    Data  Reviewed: I have personally reviewed following labs and imaging studies  CBC: Recent Labs  Lab 03/08/17 0456  WBC 6.2  HGB 10.6*  HCT 31.8*  MCV 89.8  PLT 151   Basic Metabolic Panel: Recent Labs  Lab 03/04/17 0507 03/05/17 0548 03/06/17 0648 03/07/17 0524 03/08/17 0456  NA 134* 137 138 138 138  K 4.5 4.9 4.3 4.6 3.9  CL 96* 98* 99* 101 100*  CO2 21* 20* 24 24 26   GLUCOSE 89 105* 97 107* 129*  BUN 107* 118* 71* 81* 50*  CREATININE 13.89* 14.18* 9.52* 9.68* 6.43*  CALCIUM 7.9* 8.5* 8.8* 9.2 9.3  PHOS 10.0* 10.4* 7.9* 7.8* 5.3*   GFR: Estimated Creatinine Clearance: 15.8 mL/min (A) (by C-G formula based on SCr of 6.43 mg/dL (H)). Liver  Function Tests: Recent Labs  Lab 03/04/17 0507 03/05/17 0548 03/06/17 0648 03/07/17 0524 03/08/17 0456  ALBUMIN 2.7* 2.9* 2.8* 3.1* 3.1*   No results for input(s): LIPASE, AMYLASE in the last 168 hours. No results for input(s): AMMONIA in the last 168 hours. Coagulation Profile: No results for input(s): INR, PROTIME in the last 168 hours. Cardiac Enzymes: Recent Labs  Lab 03/02/17 0524 03/03/17 0515 03/04/17 0507  CKTOTAL 565* 482* 418*   BNP (last 3 results) No results for input(s): PROBNP in the last 8760 hours. HbA1C: No results for input(s): HGBA1C in the last 72 hours. CBG: No results for input(s): GLUCAP in the last 168 hours. Lipid Profile: No results for input(s): CHOL, HDL, LDLCALC, TRIG, CHOLHDL, LDLDIRECT in the last 72 hours. Thyroid Function Tests: No results for input(s): TSH, T4TOTAL, FREET4, T3FREE, THYROIDAB in the last 72 hours. Anemia Panel: No results for input(s): VITAMINB12, FOLATE, FERRITIN, TIBC, IRON, RETICCTPCT in the last 72 hours. Sepsis Labs: No results for input(s): PROCALCITON, LATICACIDVEN in the last 168 hours.  Recent Results (from the past 240 hour(s))  Culture, blood (routine x 2)     Status: None   Collection Time: 02/26/17  2:02 PM  Result Value Ref Range Status   Specimen  Description BLOOD LEFT ANTECUBITAL  Final   Special Requests   Final    BOTTLES DRAWN AEROBIC AND ANAEROBIC Blood Culture adequate volume   Culture   Final    NO GROWTH 5 DAYS Performed at [facility], [address] [place], [place] [address]    Report Status 03/03/2017 FINAL  Final  Culture, blood (routine x 2)     Status: None   Collection Time: 02/26/17  2:11 PM  Result Value Ref Range Status   Specimen Description BLOOD RIGHT ANTECUBITAL  Final   Special Requests   Final    BOTTLES DRAWN AEROBIC AND ANAEROBIC Blood Culture results may not be optimal due to an excessive volume of blood received in culture bottles   Culture   Final    NO GROWTH 5 DAYS Performed at [facility], [place] [address] [place], [place] [address]    Report Status 03/03/2017 FINAL  Final       Radiology Studies: No results found.    Scheduled Meds: . aluminum hydroxide  30 mL Oral TID  . amLODipine  10 mg Oral Daily  . dolutegravir  50 mg Oral Daily  . feeding supplement (PRO-STAT SUGAR FREE 64)  30 mL Oral BID  . heparin injection (subcutaneous)  5,000 Units Subcutaneous Q8H  . multivitamin  1 tablet Oral QHS  . OLANZapine  10 mg Oral QHS  . tenofovir  300 mg Oral Q Tue  . zidovudine  300 mg Oral Daily   Continuous Infusions:   LOS: 17 days    Time spent: 30 minutes   Dessa Phi, DO [facility] [email or web address] Password [facility] 03/08/2017, 11:03 AM

## 2017-03-08 NOTE — Progress Notes (Signed)
Vein map pending He is posted for Monday 03/12/17 with Dr Bridgett Larsson for tunneled catheter and left arm AV fistula  Ruta Hinds, MD [facility] [place]: [phone] Pager: [phone]

## 2017-03-08 NOTE — Telephone Encounter (Signed)
HIV Genotype Composite Data Genotype Dates:   Mutations in Bold impact drug susceptibility RT Mutations V75I, M184V,  V108I, Y181C, H221Y  PI Mutations None?  Integrase Mutations None?   Interpretation of Genotype Data per [employer] HIV Database Nucleoside RTIs  abacavir (ABC) Low-Level Resistance zidovudine (AZT) Susceptible emtricitabine (FTC) High-Level Resistance lamivudine (3TC) High-Level Resistance tenofovir (TDF) Susceptible   Non-Nucleoside RTIs  doravirine (DOR) Intermediate Resistance efavirenz (EFV) Intermediate Resistance etravirine (ETR) Intermediate Resistance nevirapine (NVP) High-Level Resistance rilpivirine (RPV) High-Level Resistance   Protease Inhibitors     Integrase Inhibitors     Previous regimen: Truvada + Atazanavir/ritonavir

## 2017-03-08 NOTE — Progress Notes (Signed)
Upper Extremity Vein Map    Right Cephalic  Segment Diameter Depth Comment  1. Axilla 1.13mm mm   2. Mid upper arm 1.76mm mm thickening  3. Above AC 0.15mm mm thickening  4. In [facility] 1.55mm mm thickening  5. Below AC 1.62mm mm   6. Mid forearm 4.31mm mm thrombus  7. Wrist mm mm    mm mm    mm mm    mm mm    Right Basilic  Segment Diameter Depth Comment  1. Axilla mm mm   2. Mid upper arm 1.24mm 2.79mm   3. Above [facility] 1.81mm mm tortuous  4. In AC mm mm   5. Below AC mm mm   6. Mid forearm mm mm   7. Wrist mm mm    mm mm    mm mm    mm mm    Left Cephalic  Segment Diameter Depth Comment  1. Axilla 1.83mm 2.55mm   2. Mid upper arm 1.48mm 2.88mm   3. Above AC 1.65mm mm thickening  4. In [facility] 2.69mm mm thickening  5. Below AC 2.45mm mm   6. Mid forearm 1.34mm mm thrombus  7. Wrist mm mm    mm mm    mm mm    mm mm    Left Basilic  Segment Diameter Depth Comment  1. Axilla mm mm   2. Mid upper arm 1.25mm 1.82mm thickening  3. Above [facility] 2.73mm 2.80mm thickening  4. In [facility] 2.53mm mm   5. Below AC mm mm   6. Mid forearm mm mm   7. Wrist mm mm    mm mm    mm mm    mm mm    Landry Mellow, RDMS, RVT

## 2017-03-08 NOTE — Progress Notes (Signed)
Subjective:  No cos / Tolerated HD yest with  R Ij Temp. VVS seeing for perm cath  And L Arm AVF  03/12/17  Posted  Dr Bridgett Larsson . Educational  TV channels  con cering ESRD / Dialysis shown to Pt .to view . Needs Sw inpt  Homeless   Objective Vital signs in last 24 hours: Vitals:   03/07/17 1810 03/07/17 2032 03/08/17 0451 03/08/17 0900  BP: 132/81 127/77 123/71 132/65  Pulse: 83 99 83 88  Resp: 17 16 16 17   Temp: 98.4 F (36.9 C) 99 F (37.2 C) 98.8 F (37.1 C) 98.9 F (37.2 C)  TempSrc: Oral Oral Oral Oral  SpO2: 95% 97% 97% 99%  Weight: 96.9 kg (213 lb 10 oz) 96.9 kg (213 lb 10 oz)    Height:       Weight change: -4.4 kg (-11.2 oz)  Physical Exam: Gen: walking halls earlier now   watching television.  NAD  CVS: Pulse regular rhythm, normal rate, S1 and S2 normal Resp: Clear to auscultation bilaterally, no audible rales or rhonchi Abd: Soft, flat, nontender Ext: No lower extremity edema, right IJ temporary catheter in place.    Problem/Plan:  1.  Acute kidney injury: Secondary to rhabdomyolysis/pigment associated nephropathy.  Charted UOP likely inaccurate per patient but labs continue to show persistent AKI without renal recovery yet. Had Plan for HD yesterday   (without UF) for clearance and continued monitoring for recovery.  2.  Healthcare associated pneumonia: Status post completion of intravenous vancomycin and aztreonam-stop date was 03/01/17.  Currently remains afebrile and without cough/shortness of breath. 3.  HIV infection: On antiretroviral therapy, management/adjustments per primary service and infectious diseases. 4.  Schizoaffective disorder: On Zyprexa and currently with a sitter by bedside awaiting medical stability prior to transfer to inpatient psychiatry unit. 5.  Hyperphosphatemia: Secondary to acute kidney injury, continue aluminum hydroxide -3 day course to stop today vs tomorrow/ noted Phos improving    Ernest Haber, PA-C [facility]  [phone] 03/08/2017,10:53 AM  LOS: 17 days   Labs: Basic Metabolic Panel: Recent Labs  Lab 03/06/17 0648 03/07/17 0524 03/08/17 0456  NA 138 138 138  K 4.3 4.6 3.9  CL 99* 101 100*  CO2 24 24 26   GLUCOSE 97 107* 129*  BUN 71* 81* 50*  CREATININE 9.52* 9.68* 6.43*  CALCIUM 8.8* 9.2 9.3  PHOS 7.9* 7.8* 5.3*   Liver Function Tests: Recent Labs  Lab 03/06/17 0648 03/07/17 0524 03/08/17 0456  ALBUMIN 2.8* 3.1* 3.1*   No results for input(s): LIPASE, AMYLASE in the last 168 hours. No results for input(s): AMMONIA in the last 168 hours. CBC: Recent Labs  Lab 03/08/17 0456  WBC 6.2  HGB 10.6*  HCT 31.8*  MCV 89.8  PLT 155   Cardiac Enzymes: Recent Labs  Lab 03/02/17 0524 03/03/17 0515 03/04/17 0507  CKTOTAL 565* 482* 418*   CBG: No results for input(s): GLUCAP in the last 168 hours.  Studies/Results: No results found. Medications:  . aluminum hydroxide  30 mL Oral TID  . amLODipine  10 mg Oral Daily  . dolutegravir  50 mg Oral Daily  . feeding supplement (PRO-STAT SUGAR FREE 64)  30 mL Oral BID  . heparin injection (subcutaneous)  5,000 Units Subcutaneous Q8H  . multivitamin  1 tablet Oral QHS  . OLANZapine  10 mg Oral QHS  . tenofovir  300 mg Oral Q Tue  . zidovudine  300 mg Oral Daily

## 2017-03-09 LAB — RENAL FUNCTION PANEL
ALBUMIN: 3.2 g/dL — AB (ref 3.5–5.0)
ANION GAP: 15 (ref 5–15)
BUN: 62 mg/dL — ABNORMAL HIGH (ref 6–20)
CO2: 24 mmol/L (ref 22–32)
CREATININE: 6.74 mg/dL — AB (ref 0.61–1.24)
Calcium: 9.7 mg/dL (ref 8.9–10.3)
Chloride: 99 mmol/L — ABNORMAL LOW (ref 101–111)
GFR calc non Af Amer: 8 mL/min — ABNORMAL LOW (ref >60)
GFR, EST AFRICAN AMERICAN: 10 mL/min — AB (ref >60)
GLUCOSE: 154 mg/dL — AB (ref 65–99)
PHOSPHORUS: 5.8 mg/dL — AB (ref 2.5–4.6)
Potassium: 3.6 mmol/L (ref 3.5–5.1)
SODIUM: 138 mmol/L (ref 135–145)

## 2017-03-09 LAB — CBC
HCT: 32.9 % — ABNORMAL LOW (ref 39.0–52.0)
HEMOGLOBIN: 10.9 g/dL — AB (ref 13.0–17.0)
MCH: 30.2 pg (ref 26.0–34.0)
MCHC: 33.1 g/dL (ref 30.0–36.0)
MCV: 91.1 fL (ref 78.0–100.0)
Platelets: 163 10*3/uL (ref 150–400)
RBC: 3.61 MIL/uL — ABNORMAL LOW (ref 4.22–5.81)
RDW: 12.8 % (ref 11.5–15.5)
WBC: 6.2 10*3/uL (ref 4.0–10.5)

## 2017-03-09 MED ORDER — POLYETHYLENE GLYCOL 3350 17 G PO PACK: 17.0000 g | PACK | Freq: Every day | ORAL | Status: DC | PRN

## 2017-03-09 MED ORDER — AMLODIPINE BESYLATE 10 MG PO TABS
10.0000 mg | ORAL_TABLET | Freq: Every day | ORAL | Status: DC
  Administered 2017-03-10 – 2017-03-13 (×4): 10 mg via ORAL
  Filled 2017-03-09 (×4): qty 1

## 2017-03-09 MED ORDER — DOCUSATE SODIUM 100 MG PO CAPS
100.0000 mg | ORAL_CAPSULE | Freq: Every day | ORAL | Status: DC | PRN
  Administered 2017-03-12 – 2017-03-13 (×2): 100 mg via ORAL
  Filled 2017-03-09 (×2): qty 1

## 2017-03-09 MED ORDER — POLYETHYLENE GLYCOL 3350 17 G PO PACK: 17.0000 g | PACK | Freq: Every day | ORAL | Status: DC

## 2017-03-09 MED ORDER — CLINDAMYCIN PHOSPHATE 600 MG/50ML IV SOLN: 600.0000 mg | INTRAVENOUS | Status: DC

## 2017-03-09 NOTE — Progress Notes (Signed)
Pt has watched all Renal education videos as ordered. Notable knowledge deficit regarding the renal diet. New order placed for consult to the dietician per scope. Dorthey Sawyer, RN

## 2017-03-09 NOTE — Clinical Social Work Note (Signed)
CSW continuing to follow patient's progress and he is not yet ready for discharge. CSW will follow-up with patient regarding shelter and housing resources prior to his discharge.  Elfrieda Espino Givens, MSW, LCSW Licensed Clinical Social Worker [place] [phone]

## 2017-03-09 NOTE — Progress Notes (Signed)
Vein map reviewed small poor quality veins both arms.  Will need AV graft on Monday with catheter.  Ruta Hinds, MD [facility] Office: [phone] Pager: [phone]

## 2017-03-09 NOTE — Progress Notes (Signed)
Subjective:  Co some constipation , no bm 3 days / last HD wed 03/07/17/ vein map done / avf/pcath  For mon 03/12/17  Objective Vital signs in last 24 hours: Vitals:   03/08/17 1718 03/08/17 2040 03/09/17 0445 03/09/17 0908  BP: 132/81 140/89 133/84 128/78  Pulse: 93 91 83 84  Resp: 18 18 18 18   Temp: 98.6 F (37 C) 98 F (36.7 C) 98.3 F (36.8 C) 98.2 F (36.8 C)  TempSrc: Oral Oral Oral Oral  SpO2: 100% 100% 99% 100%  Weight:  96.9 kg (213 lb 10 oz)    Height:       Weight change: 0 kg (0 lb)  Physical Exam: Gen: Alert Ox3. NAD  CVS: Pulse regular rhythm, normal rate, S1 and S2 normal Resp: Clear to auscultationbilaterally, no audiblerales or rhonchi Abd: Soft, flat, nontender/ Nondistended  Ext: No lower extremity edema, right IJ temporary catheter in place.    Problem/Plan:  1. Acute kidney injury:Secondary to rhabdomyolysis/pigment associated nephropathy. Charted UOP likely inaccurate per patient but labs continue to show persistent AKI without renal recovery yet. Last HD wed 03/07/17 . VVS seeing for p. Cath / avf  03/12/17  /  2. Healthcare associated pneumonia: Status post completion of intravenous vancomycin and aztreonam-stop date was 03/01/17. Currently remains afebrile and without cough/shortness of breath. 3   HTN/VOL - hd min uf  ,on Amlodipine 10 mg q day >make hs for hd bp  support 4. HIV infection:On antiretroviral therapy, management/adjustments per primary service and infectious diseases. 5 Schizoaffective disorder:On Zyprexa and currently with a sitter by bedside awaiting medical stability prior to transfer to inpatient psychiatry unit. 6. Hyperphosphatemia: Secondary to acute kidney injury, continue aluminum hydroxide-3 daycourse to stop today vs tomorrow/ noted Phos improving  7.8 > 5.8  7. Anemia Of ESRD = hgb 10.9 no esa needed 8 Substance abuse =COCAINE pos   02/19/17 admit    Ernest Haber, PA-C [facility]  [phone] 03/09/2017,10:27 AM  LOS: 18 days   Labs: Basic Metabolic Panel: Recent Labs  Lab 03/07/17 0524 03/08/17 0456 03/09/17 0506  NA 138 138 138  K 4.6 3.9 3.6  CL 101 100* 99*  CO2 24 26 24   GLUCOSE 107* 129* 154*  BUN 81* 50* 62*  CREATININE 9.68* 6.43* 6.74*  CALCIUM 9.2 9.3 9.7  PHOS 7.8* 5.3* 5.8*   Liver Function Tests: Recent Labs  Lab 03/07/17 0524 03/08/17 0456 03/09/17 0506  ALBUMIN 3.1* 3.1* 3.2*   No results for input(s): LIPASE, AMYLASE in the last 168 hours. No results for input(s): AMMONIA in the last 168 hours. CBC: Recent Labs  Lab 03/08/17 0456 03/09/17 0506  WBC 6.2 6.2  HGB 10.6* 10.9*  HCT 31.8* 32.9*  MCV 89.8 91.1  PLT 155 163   Cardiac Enzymes: Recent Labs  Lab 03/03/17 0515 03/04/17 0507  CKTOTAL 482* 418*   CBG: No results for input(s): GLUCAP in the last 168 hours.  Studies/Results: No results found. Medications: . [START ON 03/12/2017] clindamycin (CLEOCIN) IV     . amLODipine  10 mg Oral Daily  . dolutegravir  50 mg Oral Daily  . feeding supplement (PRO-STAT SUGAR FREE 64)  30 mL Oral BID  . heparin injection (subcutaneous)  5,000 Units Subcutaneous Q8H  . multivitamin  1 tablet Oral QHS  . OLANZapine  10 mg Oral QHS  . tenofovir  300 mg Oral Q Tue  . zidovudine  300 mg Oral Daily

## 2017-03-09 NOTE — Progress Notes (Signed)
PROGRESS NOTE    BRUNO LEACH  ZOX:[id number] DOB: 12/03/1965 DOA: 02/19/2017 PCP: Carlyle Basques, MD     Brief Narrative:  [name] Dylan is a 52 year old male with history of HIV, schizophrenia who presented with a 3-week history of suicidal and homicidal ideation. He took a bunch of pills 2 days prior to admission and had been doing cocaine. He felt like he wanted to kill himself and other people. He came to the ER due to significantly decreased urine output. In the ED, he was found to have rhabdomyolysis with acute renal failure requiring HD which was started on 2/15. On 2/18, he spiked a fever and was found to have pneumonia. He has been evaluated by infectious disease for HIV medication. Psych was consulted and patient now cleared from psych standpoint.   Assessment & Plan:   Principal Problem:   AKI (acute kidney injury) ([place]) Active Problems:   HIV disease ([place])   Schizoaffective disorder, depressive type ([place])   Cocaine abuse ([place])   Rhabdomyolysis   Suicidal overdose ([place])   Acute hepatitis   Schizoaffective disorder, bipolar type ([place])   Acute renal failure (ARF) ([place])   Suicidal ideations   Chronic 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 coma ([place])   Acute renal failure requiring HD -Secondary to rhabdomyolysis/pigment associated nephropathy  -CK above 50,000--> 25,000--> 12,000--> 18,000--> 2,000--> 1,141 -Renal US; negative for hydronephrosis -Nephrology on board, continue HD  -Vascular surgery planning for tunneled catheter and left arm AV fistula 3/4 with Dr. Bridgett Larsson   Healthcare associated pneumonia -Completed IV Vanc + IV Aztreonam  Suicidal ideation / Schizoaffective disorder -Started on zyprexa. monitor QTc on EKG -Psych consulted and patient is cleared   HIV/Chronic hep B -Now on tenofovir DF weekly, dolutegravir daily, zidovudine daily, stop lamivudine  -ID following   HTN -Continue amlodipine  -BP stable   Polysubstance  abuse -Cocaine use, counseled against use    DVT prophylaxis: subq hep Code Status: full Family Communication: no family at bedside Disposition Plan: pending surgical procedure next week    Consultants:   ID  Nephrology   Vascular surgery   Procedures:   None    Subjective: No complaints, eating breakfast at bedside. No chest pain or SOB. Mild constipation.   Objective: Vitals:   03/08/17 1718 03/08/17 2040 03/09/17 0445 03/09/17 0908  BP: 132/81 140/89 133/84 128/78  Pulse: 93 91 83 84  Resp: 18 18 18 18   Temp: 98.6 F (37 C) 98 F (36.7 C) 98.3 F (36.8 C) 98.2 F (36.8 C)  TempSrc: Oral Oral Oral Oral  SpO2: 100% 100% 99% 100%  Weight:  96.9 kg (213 lb 10 oz)    Height:        Intake/Output Summary (Last 24 hours) at 03/09/2017 1107 Last data filed at 03/09/2017 0446 Gross per 24 hour  Intake 240 ml  Output 400 ml  Net -160 ml   Filed Weights   03/07/17 1810 03/07/17 2032 03/08/17 2040  Weight: 96.9 kg (213 lb 10 oz) 96.9 kg (213 lb 10 oz) 96.9 kg (213 lb 10 oz)    Examination: General exam: Appears calm and comfortable  Respiratory system: Clear to auscultation. Respiratory effort normal. Cardiovascular system: S1 & S2 heard, RRR. No JVD, murmurs, rubs, gallops or clicks. No pedal edema. Gastrointestinal system: Abdomen is nondistended, soft and nontender. No organomegaly or masses felt. Normal bowel sounds heard. Central nervous system: Alert and oriented. No focal neurological deficits. Extremities: Symmetric 5 x  5 power. Skin: No rashes, lesions or ulcers Psychiatry: Judgement and insight appear stable. Mood & affect flat.    Data Reviewed: I have personally reviewed following labs and imaging studies  CBC: Recent Labs  Lab 03/08/17 0456 03/09/17 0506  WBC 6.2 6.2  HGB 10.6* 10.9*  HCT 31.8* 32.9*  MCV 89.8 91.1  PLT 155 491   Basic Metabolic Panel: Recent Labs  Lab 03/05/17 0548 03/06/17 0648 03/07/17 0524 03/08/17 0456  03/09/17 0506  NA 137 138 138 138 138  K 4.9 4.3 4.6 3.9 3.6  CL 98* 99* 101 100* 99*  CO2 20* 24 24 26 24   GLUCOSE 105* 97 107* 129* 154*  BUN 118* 71* 81* 50* 62*  CREATININE 14.18* 9.52* 9.68* 6.43* 6.74*  CALCIUM 8.5* 8.8* 9.2 9.3 9.7  PHOS 10.4* 7.9* 7.8* 5.3* 5.8*   GFR: Estimated Creatinine Clearance: 15.1 mL/min (A) (by C-G formula based on SCr of 6.74 mg/dL (H)). Liver Function Tests: Recent Labs  Lab 03/05/17 0548 03/06/17 7915 03/07/17 0524 03/08/17 0456 03/09/17 0506  ALBUMIN 2.9* 2.8* 3.1* 3.1* 3.2*   No results for input(s): LIPASE, AMYLASE in the last 168 hours. No results for input(s): AMMONIA in the last 168 hours. Coagulation Profile: No results for input(s): INR, PROTIME in the last 168 hours. Cardiac Enzymes: Recent Labs  Lab 03/03/17 0515 03/04/17 0507  CKTOTAL 482* 418*   BNP (last 3 results) No results for input(s): PROBNP in the last 8760 hours. HbA1C: No results for input(s): HGBA1C in the last 72 hours. CBG: No results for input(s): GLUCAP in the last 168 hours. Lipid Profile: No results for input(s): CHOL, HDL, LDLCALC, TRIG, CHOLHDL, LDLDIRECT in the last 72 hours. Thyroid Function Tests: No results for input(s): TSH, T4TOTAL, FREET4, T3FREE, THYROIDAB in the last 72 hours. Anemia Panel: No results for input(s): VITAMINB12, FOLATE, FERRITIN, TIBC, IRON, RETICCTPCT in the last 72 hours. Sepsis Labs: No results for input(s): PROCALCITON, LATICACIDVEN in the last 168 hours.  No results found for this or any previous visit (from the past 240 hour(s)).     Radiology Studies: No results found.    Scheduled Meds: . [START ON 03/10/2017] amLODipine  10 mg Oral QHS  . dolutegravir  50 mg Oral Daily  . feeding supplement (PRO-STAT SUGAR FREE 64)  30 mL Oral BID  . heparin injection (subcutaneous)  5,000 Units Subcutaneous Q8H  . multivitamin  1 tablet Oral QHS  . OLANZapine  10 mg Oral QHS  . tenofovir  300 mg Oral Q Tue  . zidovudine   300 mg Oral Daily   Continuous Infusions: . [START ON 03/12/2017] clindamycin (CLEOCIN) IV       LOS: 18 days    Time spent: 20 minutes   Dessa Phi, DO [facility] [email or web address] Password [email or web address] 03/09/2017, 11:07 AM

## 2017-03-10 LAB — RENAL FUNCTION PANEL
ANION GAP: 14 (ref 5–15)
Albumin: 3.1 g/dL — ABNORMAL LOW (ref 3.5–5.0)
BUN: 69 mg/dL — ABNORMAL HIGH (ref 6–20)
CALCIUM: 10.3 mg/dL (ref 8.9–10.3)
CHLORIDE: 100 mmol/L — AB (ref 101–111)
CO2: 24 mmol/L (ref 22–32)
Creatinine, Ser: 6.33 mg/dL — ABNORMAL HIGH (ref 0.61–1.24)
GFR calc non Af Amer: 9 mL/min — ABNORMAL LOW (ref >60)
GFR, EST AFRICAN AMERICAN: 11 mL/min — AB (ref >60)
Glucose, Bld: 105 mg/dL — ABNORMAL HIGH (ref 65–99)
Phosphorus: 7.1 mg/dL — ABNORMAL HIGH (ref 2.5–4.6)
Potassium: 4 mmol/L (ref 3.5–5.1)
Sodium: 138 mmol/L (ref 135–145)

## 2017-03-10 MED ORDER — SEVELAMER CARBONATE 800 MG PO TABS
800.0000 mg | ORAL_TABLET | Freq: Three times a day (TID) | ORAL | Status: DC
  Administered 2017-03-10 – 2017-03-14 (×13): 800 mg via ORAL
  Filled 2017-03-10 (×14): qty 1

## 2017-03-10 MED ORDER — BOOST / RESOURCE BREEZE PO LIQD CUSTOM
1.0000 | Freq: Three times a day (TID) | ORAL | Status: DC
  Administered 2017-03-11 – 2017-03-13 (×7): 1 via ORAL
  Filled 2017-03-10 (×16): qty 1

## 2017-03-10 NOTE — Progress Notes (Signed)
[facility] Progress Note   Subjective:  Appears comfortable in bed.  Has no complaints this morning.  No UOP recorded overnight  Last HD Wed 2/27. For perm access placement Monday   Objective Vitals:   03/09/17 0908 03/09/17 1759 03/09/17 2049 03/10/17 0455  BP: 128/78 (!) 141/77 129/86 128/72  Pulse: 84 95 94 84  Resp: 18 18 20 19   Temp: 98.2 F (36.8 C) 98.4 F (36.9 C) 98.5 F (36.9 C) 98.6 F (37 C)  TempSrc: Oral Oral Oral Oral  SpO2: 100% 100% 97% 98%  Weight:   96.7 kg (213 lb 3 oz)   Height:       Physical Exam General: WNWD male NAD Heart: RRR Lungs: CTAB Abdomen: soft NT Extremities: No LE edema Dialysis access: R IJ temp cath    Assessment/Plan: 1. Acute kidney injury:Secondary to rhabdomyolysis/pigment associated nephropathy. Charted UOP likely inaccurate per patient but labs continue to show persistent AKI without renal recovery yet. Last HD wed 03/07/17 . VVS following for TDC/AVG placement on Monday 3/4  2. Healthcare associated pneumonia: Status post completion of intravenous vancomycin and aztreonam-stop date was 03/01/17. Currently remains afebrile and without cough/shortness of breath. 3   HTN/VOL - hd min uf  ,on Amlodipine 10 mg q day >make hs for hd bp  support 4. HIV infection:On antiretroviral therapy, management/adjustments per primary service and infectious diseases. 5 Schizoaffective disorder:On Zyprexa awaiting medical stability prior to transfer to inpatient psychiatry unit. 6. Hyperphosphatemia: Secondary to acute kidney injury, s/p 3 day course aluminum hydroxide. Phos elevated - will add Renvela binder   7. Anemia Of ESRD  Hgb 10.9 no esa needed 8 Substance abuse cocaine+ on    02/19/17 admit     Lynnda Child PA-C [facility] Pager [phone] 03/10/2017,9:33 AM  LOS: 19 days   Additional Objective Labs: Basic Metabolic Panel: Recent Labs  Lab 03/08/17 0456 03/09/17 0506  03/10/17 0619  NA 138 138 138  K 3.9 3.6 4.0  CL 100* 99* 100*  CO2 26 24 24   GLUCOSE 129* 154* 105*  BUN 50* 62* 69*  CREATININE 6.43* 6.74* 6.33*  CALCIUM 9.3 9.7 10.3  PHOS 5.3* 5.8* 7.1*   CBC: Recent Labs  Lab 03/08/17 0456 03/09/17 0506  WBC 6.2 6.2  HGB 10.6* 10.9*  HCT 31.8* 32.9*  MCV 89.8 91.1  PLT 155 163   Blood Culture    Component Value Date/Time   SDES BLOOD RIGHT ANTECUBITAL 02/26/2017 1411   SPECREQUEST  02/26/2017 1411    BOTTLES DRAWN AEROBIC AND ANAEROBIC Blood Culture results may not be optimal due to an excessive volume of blood received in culture bottles   CULT  02/26/2017 1411    NO GROWTH 5 DAYS Performed at [facility], [place] [address] [place], [place] [address]    REPTSTATUS 03/03/2017 FINAL 02/26/2017 1411    Cardiac Enzymes: Recent Labs  Lab 03/04/17 0507  CKTOTAL 418*   CBG: No results for input(s): GLUCAP in the last 168 hours. Iron Studies: No results for input(s): IRON, TIBC, TRANSFERRIN, FERRITIN in the last 72 hours. Lab Results  Component Value Date   INR 1.02 02/20/2017   Medications: . [START ON 03/12/2017] clindamycin (CLEOCIN) IV     . amLODipine  10 mg Oral QHS  . dolutegravir  50 mg Oral Daily  . feeding supplement (PRO-STAT SUGAR FREE 64)  30 mL Oral BID  . heparin injection (subcutaneous)  5,000 Units Subcutaneous Q8H  .  multivitamin  1 tablet Oral QHS  . OLANZapine  10 mg Oral QHS  . tenofovir  300 mg Oral Q Tue  . zidovudine  300 mg Oral Daily

## 2017-03-10 NOTE — Progress Notes (Signed)
Initial Nutrition Assessment  DOCUMENTATION CODES:   Not applicable  INTERVENTION:    Boost Breeze po TID, each supplement provides 250 kcal and 9 grams of protein  Renal diet education provided  NUTRITION DIAGNOSIS:   Inadequate oral intake related to poor appetite as evidenced by per patient/family report.  GOAL:   Patient will meet greater than or equal to 90% of their needs  MONITOR:   PO intake, Supplement acceptance, Labs, Weight trends  REASON FOR ASSESSMENT:     Diet education(Renal diet)  ASSESSMENT:   52 yo male with PMH of HIV, hepatitis B, schizophrenia, bipolar 1 disorder, arthritis, depression who was admitted on 2/11 with a 3 week history of suicidal and homicidal ideation; found to have rhabdomyolysis, acute renal failure requiring HD, and PNA.   Received MD consult for diet education because patient is new to HD. Per review of Nephrology notes, renal function is improving and patient may not require permanent HD access. He has been receiving HD since 2/15.  Provided Renal Pyramid handout to patient. Explained why diet restrictions are needed and provided lists of foods to limit/avoid that are high potassium, sodium, and phosphorus. Provided specific recommendations on safer alternatives of these foods.   Patient reports that he has been eating ~50% of his meals. He agreed to try a PO supplement to improve oral intake. No weight loss reported.  NUTRITION - FOCUSED PHYSICAL EXAM:    Most Recent Value  Orbital Region  No depletion  Upper Arm Region  No depletion  Thoracic and Lumbar Region  No depletion  Buccal Region  No depletion  Temple Region  No depletion  Clavicle Bone Region  No depletion  Clavicle and Acromion Bone Region  No depletion  Scapular Bone Region  No depletion  Dorsal Hand  No depletion  Patellar Region  No depletion  Anterior Thigh Region  No depletion  Posterior Calf Region  No depletion  Edema (RD Assessment)  Mild  Hair   Reviewed  Eyes  Reviewed  Mouth  Reviewed  Skin  Reviewed  Nails  Reviewed       Diet Order:  Diet renal with fluid restriction Fluid restriction: 1200 mL Fluid; Room service appropriate? Yes; Fluid consistency: Thin Diet NPO time specified  EDUCATION NEEDS:   Education needs have been addressed(renal diet education provided)  Skin:  Skin Assessment: Reviewed RN Assessment  Last BM:  2/27  Height:   Ht Readings from Last 1 Encounters:  02/20/17 6\' 2"  (1.88 m)    Weight:   Wt Readings from Last 1 Encounters:  03/09/17 213 lb 3 oz (96.7 kg)    Ideal Body Weight:  86.4 kg  BMI:  Body mass index is 27.37 kg/m.  Estimated Nutritional Needs:   Kcal:  2400-2600  Protein:  110-130 gm  Fluid:  UOP + 500 ml    Molli Barrows, RD, LDN, [place] Pager [phone] After Hours Pager [phone]

## 2017-03-10 NOTE — Progress Notes (Signed)
PROGRESS NOTE    Dylan [name]  BLT:[id number] DOB: Mar 14, 1965 DOA: 02/19/2017 PCP: Carlyle Basques, MD     Brief Narrative:  Dylan [name] is a 52 year old male with history of HIV, schizophrenia who presented with a 3-week history of suicidal and homicidal ideation. He took a bunch of pills 2 days prior to admission and had been doing cocaine. He felt like he wanted to kill himself and other people. He came to the ER due to significantly decreased urine output. In the ED, he was found to have rhabdomyolysis with acute renal failure requiring HD which was started on 2/15. On 2/18, he spiked a fever and was found to have pneumonia. He has been evaluated by infectious disease for HIV medication. Psych was consulted and patient now cleared from psych standpoint.   Assessment & Plan:   Principal Problem:   AKI (acute kidney injury) ([place]) Active Problems:   HIV disease ([place])   Schizoaffective disorder, depressive type ([place])   Cocaine abuse ([place])   Rhabdomyolysis   Suicidal overdose ([place])   Acute hepatitis   Schizoaffective disorder, bipolar type ([place])   Acute renal failure (ARF) ([place])   Suicidal ideations   Chronic 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 coma ([place])   Acute renal failure requiring HD -Secondary to rhabdomyolysis/pigment associated nephropathy  -CK above 50,000--> 25,000--> 12,000--> 18,000--> 2,000--> 1,141 -Renal US; negative for hydronephrosis -Nephrology on board, continue HD  -Vascular surgery planning for tunneled catheter and left arm AV fistula 3/4 with Dr. Bridgett Larsson   Healthcare associated pneumonia -Completed IV Vanc + IV Aztreonam  Suicidal ideation / Schizoaffective disorder -Started on zyprexa. monitor QTc on EKG -Psych consulted and patient is cleared   HIV/Chronic hep B -Now on tenofovir DF weekly, dolutegravir daily, zidovudine daily, stop lamivudine  -ID following   HTN -Continue amlodipine  -BP stable   Polysubstance  abuse -Cocaine use, counseled against use    DVT prophylaxis: subq hep Code Status: full Family Communication: no family at bedside Disposition Plan: pending surgical procedure next week    Consultants:   ID  Nephrology   Vascular surgery   Procedures:   None    Subjective: No new issues or complaints. Doing well.   Objective: Vitals:   03/09/17 1759 03/09/17 2049 03/10/17 0455 03/10/17 1006  BP: (!) 141/77 129/86 128/72 128/75  Pulse: 95 94 84 86  Resp: 18 20 19 18   Temp: 98.4 F (36.9 C) 98.5 F (36.9 C) 98.6 F (37 C) 97.8 F (36.6 C)  TempSrc: Oral Oral Oral Oral  SpO2: 100% 97% 98% 97%  Weight:  96.7 kg (213 lb 3 oz)    Height:        Intake/Output Summary (Last 24 hours) at 03/10/2017 1033 Last data filed at 03/10/2017 0900 Gross per 24 hour  Intake 1080 ml  Output 0 ml  Net 1080 ml   Filed Weights   03/07/17 2032 03/08/17 2040 03/09/17 2049  Weight: 96.9 kg (213 lb 10 oz) 96.9 kg (213 lb 10 oz) 96.7 kg (213 lb 3 oz)    Examination: General exam: Appears calm and comfortable  Respiratory system: Clear to auscultation. Respiratory effort normal. Cardiovascular system: S1 & S2 heard, RRR. No JVD, murmurs, rubs, gallops or clicks. No pedal edema. Gastrointestinal system: Abdomen is nondistended, soft and nontender. No organomegaly or masses felt. Normal bowel sounds heard. Central nervous system: Alert and oriented. No focal neurological deficits. Extremities: Symmetric 5 x 5 power. Skin: No rashes,  lesions or ulcers Psychiatry: Judgement and insight appear normal. Mood & affect appropriate.    Data Reviewed: I have personally reviewed following labs and imaging studies  CBC: Recent Labs  Lab 03/08/17 0456 03/09/17 0506  WBC 6.2 6.2  HGB 10.6* 10.9*  HCT 31.8* 32.9*  MCV 89.8 91.1  PLT 155 626   Basic Metabolic Panel: Recent Labs  Lab 03/06/17 0648 03/07/17 0524 03/08/17 0456 03/09/17 0506 03/10/17 0619  NA 138 138 138 138 138   K 4.3 4.6 3.9 3.6 4.0  CL 99* 101 100* 99* 100*  CO2 24 24 26 24 24   GLUCOSE 97 107* 129* 154* 105*  BUN 71* 81* 50* 62* 69*  CREATININE 9.52* 9.68* 6.43* 6.74* 6.33*  CALCIUM 8.8* 9.2 9.3 9.7 10.3  PHOS 7.9* 7.8* 5.3* 5.8* 7.1*   GFR: Estimated Creatinine Clearance: 16.1 mL/min (A) (by C-G formula based on SCr of 6.33 mg/dL (H)). Liver Function Tests: Recent Labs  Lab 03/06/17 9485 03/07/17 0524 03/08/17 0456 03/09/17 0506 03/10/17 0619  ALBUMIN 2.8* 3.1* 3.1* 3.2* 3.1*   No results for input(s): LIPASE, AMYLASE in the last 168 hours. No results for input(s): AMMONIA in the last 168 hours. Coagulation Profile: No results for input(s): INR, PROTIME in the last 168 hours. Cardiac Enzymes: Recent Labs  Lab 03/04/17 0507  CKTOTAL 418*   BNP (last 3 results) No results for input(s): PROBNP in the last 8760 hours. HbA1C: No results for input(s): HGBA1C in the last 72 hours. CBG: No results for input(s): GLUCAP in the last 168 hours. Lipid Profile: No results for input(s): CHOL, HDL, LDLCALC, TRIG, CHOLHDL, LDLDIRECT in the last 72 hours. Thyroid Function Tests: No results for input(s): TSH, T4TOTAL, FREET4, T3FREE, THYROIDAB in the last 72 hours. Anemia Panel: No results for input(s): VITAMINB12, FOLATE, FERRITIN, TIBC, IRON, RETICCTPCT in the last 72 hours. Sepsis Labs: No results for input(s): PROCALCITON, LATICACIDVEN in the last 168 hours.  No results found for this or any previous visit (from the past 240 hour(s)).     Radiology Studies: No results found.    Scheduled Meds: . amLODipine  10 mg Oral QHS  . dolutegravir  50 mg Oral Daily  . feeding supplement (PRO-STAT SUGAR FREE 64)  30 mL Oral BID  . heparin injection (subcutaneous)  5,000 Units Subcutaneous Q8H  . multivitamin  1 tablet Oral QHS  . OLANZapine  10 mg Oral QHS  . sevelamer carbonate  800 mg Oral TID WC  . tenofovir  300 mg Oral Q Tue  . zidovudine  300 mg Oral Daily   Continuous  Infusions: . [START ON 03/12/2017] clindamycin (CLEOCIN) IV       LOS: 19 days    Time spent: 20 minutes   Dessa Phi, DO [facility] [email or web address] Password [facility] 03/10/2017, 10:33 AM

## 2017-03-11 LAB — RENAL FUNCTION PANEL
ANION GAP: 15 (ref 5–15)
Albumin: 3.3 g/dL — ABNORMAL LOW (ref 3.5–5.0)
BUN: 70 mg/dL — ABNORMAL HIGH (ref 6–20)
CALCIUM: 10.3 mg/dL (ref 8.9–10.3)
CHLORIDE: 101 mmol/L (ref 101–111)
CO2: 22 mmol/L (ref 22–32)
Creatinine, Ser: 5.75 mg/dL — ABNORMAL HIGH (ref 0.61–1.24)
GFR calc non Af Amer: 10 mL/min — ABNORMAL LOW (ref >60)
GFR, EST AFRICAN AMERICAN: 12 mL/min — AB (ref >60)
GLUCOSE: 128 mg/dL — AB (ref 65–99)
Phosphorus: 6.5 mg/dL — ABNORMAL HIGH (ref 2.5–4.6)
Potassium: 3.6 mmol/L (ref 3.5–5.1)
SODIUM: 138 mmol/L (ref 135–145)

## 2017-03-11 MED ORDER — POLYETHYLENE GLYCOL 3350 17 G PO PACK
17.0000 g | PACK | Freq: Every day | ORAL | Status: DC
  Administered 2017-03-11 – 2017-03-14 (×4): 17 g via ORAL
  Filled 2017-03-11 (×4): qty 1

## 2017-03-11 NOTE — Progress Notes (Signed)
PROGRESS NOTE    Dylan [name]  RKY:[id number] DOB: 12/03/1965 DOA: 02/19/2017 PCP: Carlyle Basques, MD     Brief Narrative:  Dylan [name] is a 52 year old male with history of HIV, schizophrenia who presented with a 3-week history of suicidal and homicidal ideation. He took a bunch of pills 2 days prior to admission and had been doing cocaine. He felt like he wanted to kill himself and other people. He came to the ER due to significantly decreased urine output. In the ED, he was found to have rhabdomyolysis with acute renal failure requiring HD which was started on 2/15. On 2/18, he spiked a fever and was found to have pneumonia. He has been evaluated by infectious disease for HIV medication. Psych was consulted and patient now cleared from psych standpoint. Due to need for HD, vascular surgery was consulted and planned for fistula and tunneled catheter placement on 3/4.   Assessment & Plan:   Principal Problem:   AKI (acute kidney injury) ([place]) Active Problems:   HIV disease ([place])   Schizoaffective disorder, depressive type ([place])   Cocaine abuse ([place])   Rhabdomyolysis   Suicidal overdose ([place])   Acute hepatitis   Schizoaffective disorder, bipolar type ([place])   Acute renal failure (ARF) ([place])   Suicidal ideations   Chronic 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 coma ([place])   Acute renal failure requiring HD -Secondary to rhabdomyolysis/pigment associated nephropathy  -CK above 50,000--> 25,000--> 12,000--> 18,000--> 2,000--> 1,141 -Renal US; negative for hydronephrosis -Nephrology on board, continue HD  -Vascular surgery planning for tunneled catheter and left arm AV fistula 3/4 with Dr. Bridgett Larsson. Now with Cr improvement, Nephrology consider if cancel surgery or not   Healthcare associated pneumonia -Completed IV Vanc + IV Aztreonam  Suicidal ideation / Schizoaffective disorder -Started on zyprexa. monitor QTc on EKG -Psych consulted and patient is cleared    HIV/Chronic hep B -Now on tenofovir DF weekly, dolutegravir daily, zidovudine daily, stop lamivudine  -ID following   HTN -Continue amlodipine  -BP stable today   Polysubstance abuse -Cocaine use, counseled against use  Constipation -Ordered bowel regimen prn     DVT prophylaxis: subq hep Code Status: full Family Communication: no family at bedside Disposition Plan: pending surgical procedure tomorrow    Consultants:   ID  Nephrology   Vascular surgery   Procedures:   None    Subjective: Constipated, otherwise doing well without complaints of chest pain, SOB, N/V.   Objective: Vitals:   03/10/17 2129 03/11/17 0500 03/11/17 0550 03/11/17 0929  BP: 131/89  119/74 112/80  Pulse: 91  86 90  Resp: 18  18 18   Temp: 99 F (37.2 C)  98.5 F (36.9 C) 98.5 F (36.9 C)  TempSrc: Oral  Oral Oral  SpO2: 100%  96% 97%  Weight: 94.7 kg (208 lb 11.2 oz) 94.7 kg (208 lb 12.4 oz)    Height:        Intake/Output Summary (Last 24 hours) at 03/11/2017 1040 Last data filed at 03/11/2017 0900 Gross per 24 hour  Intake 1200 ml  Output 0 ml  Net 1200 ml   Filed Weights   03/09/17 2049 03/10/17 2129 03/11/17 0500  Weight: 96.7 kg (213 lb 3 oz) 94.7 kg (208 lb 11.2 oz) 94.7 kg (208 lb 12.4 oz)    Examination: General exam: Appears calm and comfortable  Respiratory system: Clear to auscultation. Respiratory effort normal. Cardiovascular system: S1 & S2 heard, RRR. No JVD, murmurs, rubs,  gallops or clicks. No pedal edema. Gastrointestinal system: Abdomen is nondistended, soft and nontender. No organomegaly or masses felt. Normal bowel sounds heard. Central nervous system: Alert and oriented. No focal neurological deficits. Extremities: Symmetric 5 x 5 power. Skin: No rashes, lesions or ulcers Psychiatry: Stable    Data Reviewed: I have personally reviewed following labs and imaging studies  CBC: Recent Labs  Lab 03/08/17 0456 03/09/17 0506  WBC 6.2 6.2  HGB  10.6* 10.9*  HCT 31.8* 32.9*  MCV 89.8 91.1  PLT 155 568   Basic Metabolic Panel: Recent Labs  Lab 03/07/17 0524 03/08/17 0456 03/09/17 0506 03/10/17 0619 03/11/17 0615  NA 138 138 138 138 138  K 4.6 3.9 3.6 4.0 3.6  CL 101 100* 99* 100* 101  CO2 24 26 24 24 22   GLUCOSE 107* 129* 154* 105* 128*  BUN 81* 50* 62* 69* 70*  CREATININE 9.68* 6.43* 6.74* 6.33* 5.75*  CALCIUM 9.2 9.3 9.7 10.3 10.3  PHOS 7.8* 5.3* 5.8* 7.1* 6.5*   GFR: Estimated Creatinine Clearance: 17.7 mL/min (A) (by C-G formula based on SCr of 5.75 mg/dL (H)). Liver Function Tests: Recent Labs  Lab 03/07/17 0524 03/08/17 0456 03/09/17 0506 03/10/17 0619 03/11/17 0615  ALBUMIN 3.1* 3.1* 3.2* 3.1* 3.3*   No results for input(s): LIPASE, AMYLASE in the last 168 hours. No results for input(s): AMMONIA in the last 168 hours. Coagulation Profile: No results for input(s): INR, PROTIME in the last 168 hours. Cardiac Enzymes: No results for input(s): CKTOTAL, CKMB, CKMBINDEX, TROPONINI in the last 168 hours. BNP (last 3 results) No results for input(s): PROBNP in the last 8760 hours. HbA1C: No results for input(s): HGBA1C in the last 72 hours. CBG: No results for input(s): GLUCAP in the last 168 hours. Lipid Profile: No results for input(s): CHOL, HDL, LDLCALC, TRIG, CHOLHDL, LDLDIRECT in the last 72 hours. Thyroid Function Tests: No results for input(s): TSH, T4TOTAL, FREET4, T3FREE, THYROIDAB in the last 72 hours. Anemia Panel: No results for input(s): VITAMINB12, FOLATE, FERRITIN, TIBC, IRON, RETICCTPCT in the last 72 hours. Sepsis Labs: No results for input(s): PROCALCITON, LATICACIDVEN in the last 168 hours.  No results found for this or any previous visit (from the past 240 hour(s)).     Radiology Studies: No results found.    Scheduled Meds: . amLODipine  10 mg Oral QHS  . dolutegravir  50 mg Oral Daily  . feeding supplement  1 Container Oral TID BM  . heparin injection (subcutaneous)   5,000 Units Subcutaneous Q8H  . multivitamin  1 tablet Oral QHS  . OLANZapine  10 mg Oral QHS  . polyethylene glycol  17 g Oral Daily  . sevelamer carbonate  800 mg Oral TID WC  . tenofovir  300 mg Oral Q Tue  . zidovudine  300 mg Oral Daily   Continuous Infusions: . [START ON 03/12/2017] clindamycin (CLEOCIN) IV       LOS: 20 days    Time spent: 20 minutes   Dessa Phi, DO [facility] [email or web address] Password [email or web address] 03/11/2017, 10:40 AM

## 2017-03-11 NOTE — Progress Notes (Signed)
   Daily Progress Note   Access plans canceled per Renal.  Available as needed.   Adele Barthel, MD, FACS [facility] Office: [phone] Pager: [phone]  03/11/2017, 6:56 PM

## 2017-03-11 NOTE — Progress Notes (Signed)
[facility] Progress Note   Subjective:  Appears comfortable in bed.  Has no complaints this morning.  Cr improving. He reports increased UOP  Objective Vitals:   03/10/17 2129 03/11/17 0500 03/11/17 0550 03/11/17 0929  BP: 131/89  119/74 112/80  Pulse: 91  86 90  Resp: 18  18 18   Temp: 99 F (37.2 C)  98.5 F (36.9 C) 98.5 F (36.9 C)  TempSrc: Oral  Oral Oral  SpO2: 100%  96% 97%  Weight: 94.7 kg (208 lb 11.2 oz) 94.7 kg (208 lb 12.4 oz)    Height:       Physical Exam General: WNWD male NAD Heart: RRR Lungs: CTAB Abdomen: soft NT Extremities: No LE edema Dialysis access: R IJ temp cath    Assessment/Plan: 1. Acute kidney injury:Secondary to rhabdomyolysis/pigment associated nephropathy. Charted UOP likely inaccurate per patient but labs continue to show persistent AKI. Last HD wed 03/07/17 . [facility] following for TDC/AVG placement on Monday 3/4 but may decide to cancel surgery if creatinine continues to trend down.  2. Healthcare associated pneumonia: Status post completion of intravenous vancomycin and aztreonam-stop date was 03/01/17. Currently remains afebrile and without cough/shortness of breath. 3   HTN/VOL - ,on Amlodipine 10 mg q day >make hs for hd bp  support 4. HIV infection:On antiretroviral therapy, management/adjustments per primary service and infectious diseases. 5 Schizoaffective disorder:On Zyprexa awaiting medical stability prior to transfer to inpatient psychiatry unit. 6. Hyperphosphatemia: Secondary to acute kidney injury, s/p 3 day course aluminum hydroxide. Phos elevated - will add Renvela binder   7. Anemia Of ESRD  Hgb 10.9 no esa needed 8 Substance abuse cocaine+ on    02/19/17 admit     Lynnda Child PA-C [facility] Pager [phone] 03/11/2017,10:08 AM  LOS: 20 days   Additional Objective Labs: Basic Metabolic Panel: Recent Labs  Lab 03/09/17 0506 03/10/17 0619 03/11/17 0615  NA 138 138 138   K 3.6 4.0 3.6  CL 99* 100* 101  CO2 24 24 22   GLUCOSE 154* 105* 128*  BUN 62* 69* 70*  CREATININE 6.74* 6.33* 5.75*  CALCIUM 9.7 10.3 10.3  PHOS 5.8* 7.1* 6.5*   CBC: Recent Labs  Lab 03/08/17 0456 03/09/17 0506  WBC 6.2 6.2  HGB 10.6* 10.9*  HCT 31.8* 32.9*  MCV 89.8 91.1  PLT 155 163   Blood Culture    Component Value Date/Time   SDES BLOOD RIGHT ANTECUBITAL 02/26/2017 1411   SPECREQUEST  02/26/2017 1411    BOTTLES DRAWN AEROBIC AND ANAEROBIC Blood Culture results may not be optimal due to an excessive volume of blood received in culture bottles   CULT  02/26/2017 1411    NO GROWTH 5 DAYS Performed at [facility], [place] [address], [place], [place] [address]    REPTSTATUS 03/03/2017 FINAL 02/26/2017 1411    Cardiac Enzymes: No results for input(s): CKTOTAL, CKMB, CKMBINDEX, TROPONINI in the last 168 hours. CBG: No results for input(s): GLUCAP in the last 168 hours. [place] Studies: No results for input(s): IRON, TIBC, TRANSFERRIN, FERRITIN in the last 72 hours. Lab Results  Component Value Date   INR 1.02 02/20/2017   Medications: . [START ON 03/12/2017] clindamycin (CLEOCIN) IV     . amLODipine  10 mg Oral QHS  . dolutegravir  50 mg Oral Daily  . feeding supplement  1 Container Oral TID BM  . heparin injection (subcutaneous)  5,000 Units Subcutaneous Q8H  . multivitamin  1 tablet Oral  QHS  . OLANZapine  10 mg Oral QHS  . polyethylene glycol  17 g Oral Daily  . sevelamer carbonate  800 mg Oral TID WC  . tenofovir  300 mg Oral Q Tue  . zidovudine  300 mg Oral Daily

## 2017-03-12 ENCOUNTER — Encounter (HOSPITAL_COMMUNITY)
Admission: EM | Disposition: A | Discharge status: Home / Self Care | Payer: Self-pay | Source: Home / Self Care | Attending: Internal Medicine

## 2017-03-12 ENCOUNTER — Ambulatory Visit: Payer: Self-pay | Admitting: Internal Medicine

## 2017-03-12 LAB — BASIC METABOLIC PANEL
ANION GAP: 14 (ref 5–15)
BUN: 72 mg/dL — ABNORMAL HIGH (ref 6–20)
CALCIUM: 10 mg/dL (ref 8.9–10.3)
CO2: 22 mmol/L (ref 22–32)
Chloride: 101 mmol/L (ref 101–111)
Creatinine, Ser: 5.21 mg/dL — ABNORMAL HIGH (ref 0.61–1.24)
GFR, EST AFRICAN AMERICAN: 13 mL/min — AB (ref >60)
GFR, EST NON AFRICAN AMERICAN: 12 mL/min — AB (ref >60)
Glucose, Bld: 104 mg/dL — ABNORMAL HIGH (ref 65–99)
Potassium: 4.1 mmol/L (ref 3.5–5.1)
Sodium: 137 mmol/L (ref 135–145)

## 2017-03-12 LAB — CBC
HCT: 32.5 % — ABNORMAL LOW (ref 39.0–52.0)
Hemoglobin: 10.9 g/dL — ABNORMAL LOW (ref 13.0–17.0)
MCH: 30.1 pg (ref 26.0–34.0)
MCHC: 33.5 g/dL (ref 30.0–36.0)
MCV: 89.8 fL (ref 78.0–100.0)
Platelets: 159 10*3/uL (ref 150–400)
RBC: 3.62 MIL/uL — ABNORMAL LOW (ref 4.22–5.81)
RDW: 12.5 % (ref 11.5–15.5)
WBC: 6.6 10*3/uL (ref 4.0–10.5)

## 2017-03-12 SURGERY — INSERTION OF DIALYSIS CATHETER
Anesthesia: Monitor Anesthesia Care

## 2017-03-12 MED ORDER — SORBITOL 70 % SOLN
30.0000 mL | Freq: Three times a day (TID) | Status: DC
  Administered 2017-03-12 – 2017-03-14 (×7): 30 mL via ORAL
  Filled 2017-03-12 (×7): qty 30

## 2017-03-12 MED ORDER — SENNA 8.6 MG PO TABS
1.0000 | ORAL_TABLET | Freq: Every day | ORAL | Status: DC
  Administered 2017-03-12 – 2017-03-14 (×4): 8.6 mg via ORAL
  Filled 2017-03-12 (×4): qty 1

## 2017-03-12 NOTE — Progress Notes (Addendum)
Subjective:  Mild constipation ,tolerareting brk, no sob , noted Scr / GFR improving HD  P.cath / access canceled   , last HD Wed 03/07/17  discussed with pt   Objective Vital signs in last 24 hours: Vitals:   03/11/17 2140 03/12/17 0439 03/12/17 0509 03/12/17 0906  BP: 130/84  102/60 134/81  Pulse: 97  81 88  Resp: 17  16 16   Temp: 97.8 F (36.6 C)  98.4 F (36.9 C) 98 F (36.7 C)  TempSrc: Oral  Oral Oral  SpO2: 100%  98% 100%  Weight: 94.6 kg (208 lb 9.6 oz) 94.6 kg (208 lb 8.9 oz)    Height:       Weight change: -0.045 kg (-1.6 oz)  Physical Exam: General: laert in bed NAD  OX3,  Heart: RRR no m, r, g  Lungs: CAT   Abdomen: BS pos , soft , nt, nd  Extremities: no pedal edema  Dialysis Access: R IJ Tem cath    Problem/Plan: 1. Acute kidney injury:Secondary to rhabdomyolysis/pigment associated nephropathy. Charted UOP  But  inaccurate per patient/ however  Labs now show improving Scr 5.21 this am < 5.75 ( 3/03)< 6.33 ( 3/02)< 6.74 ( 2/28 ) CW persistent AKI.  Last HD wed 03/07/17 . TDC/AVG placement today canceled . Pull temp cath if  am lab ok  2. Healthcare associated pneumonia: Status post completion of intravenous vancomycin and aztreonam-stop date was 03/01/17. Currently remains afebrile and without cough/shortness of breath. 3 HTN/VOL - ,on Amlodipine 10 mg q day >make hs for hd bpsupport 4. HIV infection:On antiretroviral therapy, management/adjustments per primary service and infectious diseases. 5Schizoaffective disorder:On Zyprexa awaiting medical stability prior to transfer to inpatient psychiatry unit. 6. Hyperphosphatemia: Secondary to acute kidney injury, s/p 3 day course aluminum hydroxide. Phos elevated -6.5  added Renvela binder 3/02  7. Anemia Of ESRD  Hgb 10.9 no esa needed 8 Substance abuse cocaine+ on  02/19/17 admit / Dw pt and BP  control with CKD progression if BP uncontrolled with substance abuse  9 Constipation  = Sorbitol now/prn / am  Miralax    Ernest Haber, PA-C [place] [phone] 03/12/2017,10:16 AM  LOS: 21 days   Pt seen, examined and agree w A/P as above.  Kelly Splinter MD [facility] pager [phone]   03/12/2017, 12:19 PM    Labs: Basic Metabolic Panel: Recent Labs  Lab 03/09/17 0506 03/10/17 0619 03/11/17 0615 03/12/17 0524  NA 138 138 138 137  K 3.6 4.0 3.6 4.1  CL 99* 100* 101 101  CO2 24 24 22 22   GLUCOSE 154* 105* 128* 104*  BUN 62* 69* 70* 72*  CREATININE 6.74* 6.33* 5.75* 5.21*  CALCIUM 9.7 10.3 10.3 10.0  PHOS 5.8* 7.1* 6.5*  --    Liver Function Tests: Recent Labs  Lab 03/09/17 0506 03/10/17 0619 03/11/17 0615  ALBUMIN 3.2* 3.1* 3.3*   No results for input(s): LIPASE, AMYLASE in the last 168 hours. No results for input(s): AMMONIA in the last 168 hours. CBC: Recent Labs  Lab 03/08/17 0456 03/09/17 0506 03/12/17 0524  WBC 6.2 6.2 6.6  HGB 10.6* 10.9* 10.9*  HCT 31.8* 32.9* 32.5*  MCV 89.8 91.1 89.8  PLT 155 163 159   Cardiac Enzymes: No results for input(s): CKTOTAL, CKMB, CKMBINDEX, TROPONINI in the last 168 hours. CBG: No results for input(s): GLUCAP in the last 168 hours.  Studies/Results: No results found. Medications: . clindamycin (CLEOCIN) IV     .  amLODipine  10 mg Oral QHS  . dolutegravir  50 mg Oral Daily  . feeding supplement  1 Container Oral TID BM  . heparin injection (subcutaneous)  5,000 Units Subcutaneous Q8H  . multivitamin  1 tablet Oral QHS  . OLANZapine  10 mg Oral QHS  . polyethylene glycol  17 g Oral Daily  . sevelamer carbonate  800 mg Oral TID WC  . tenofovir  300 mg Oral Q Tue  . zidovudine  300 mg Oral Daily

## 2017-03-12 NOTE — Progress Notes (Signed)
PROGRESS NOTE    Dylan [name]  SEG:[id number] DOB: 1965-11-02 DOA: 02/19/2017 PCP: Carlyle Basques, MD     Brief Narrative:  Dylan [name] is a 52 year old male with history of HIV, schizophrenia who presented with a 3-week history of suicidal and homicidal ideation. He took a bunch of pills 2 days prior to admission and had been doing cocaine. He felt like he wanted to kill himself and other people. He came to the ER due to significantly decreased urine output. In the ED, he was found to have rhabdomyolysis with acute renal failure requiring HD which was started on 2/15. On 2/18, he spiked a fever and was found to have pneumonia. He has been evaluated by infectious disease for HIV medication. Psych was consulted and patient now cleared from psych standpoint. Due to need for HD, vascular surgery was consulted and planned for fistula and tunneled catheter placement on 3/4. As patient had continued improvement of kidney function, surgical procedure was canceled as he had no further need for HD.   Assessment & Plan:   Principal Problem:   AKI (acute kidney injury) ([place]) Active Problems:   HIV disease ([place])   Schizoaffective disorder, depressive type ([place])   Cocaine abuse ([place])   Rhabdomyolysis   Suicidal overdose ([place])   Acute hepatitis   Schizoaffective disorder, bipolar type ([place])   Acute renal failure (ARF) ([place])   Suicidal ideations   Chronic 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 coma ([place])   Acute renal failure requiring HD -Secondary to rhabdomyolysis/pigment associated nephropathy  -CK above 50,000--> 25,000--> 12,000--> 18,000--> 2,000--> 1,141 -Renal US; negative for hydronephrosis -Nephrology following  -Surgical procedure for permanent HD access canceled   Healthcare associated pneumonia -Completed IV Vanc + IV Aztreonam  Suicidal ideation / Schizoaffective disorder -Started on zyprexa. monitor QTc on EKG -Psych consulted and patient is cleared    -Stable   HIV/Chronic hep B -Now on tenofovir DF weekly, dolutegravir daily, zidovudine daily, stop lamivudine   HTN -Continue amlodipine  -BP stable this morning   Polysubstance abuse -Cocaine use, counseled against use  Constipation -Ordered bowel regimen prn     DVT prophylaxis: subq hep Code Status: full Family Communication: no family at bedside Disposition Plan: pending Nephrology plan. SW to provide resources for discharge as patient homeless    Consultants:   ID  Nephrology   Vascular surgery   Procedures:   None    Subjective: No new complaints, issues, events. Doing well.   Objective: Vitals:   03/11/17 2140 03/12/17 0439 03/12/17 0509 03/12/17 0906  BP: 130/84  102/60 134/81  Pulse: 97  81 88  Resp: 17  16 16   Temp: 97.8 F (36.6 C)  98.4 F (36.9 C) 98 F (36.7 C)  TempSrc: Oral  Oral Oral  SpO2: 100%  98% 100%  Weight: 94.6 kg (208 lb 9.6 oz) 94.6 kg (208 lb 8.9 oz)    Height:        Intake/Output Summary (Last 24 hours) at 03/12/2017 1019 Last data filed at 03/12/2017 0930 Gross per 24 hour  Intake 1542 ml  Output 1400 ml  Net 142 ml   Filed Weights   03/11/17 0500 03/11/17 2140 03/12/17 0439  Weight: 94.7 kg (208 lb 12.4 oz) 94.6 kg (208 lb 9.6 oz) 94.6 kg (208 lb 8.9 oz)   Examination: General exam: Appears calm and comfortable  Respiratory system: Clear to auscultation. Respiratory effort normal. Cardiovascular system: S1 & S2 heard, RRR. No JVD,  murmurs, rubs, gallops or clicks. No pedal edema. Gastrointestinal system: Abdomen is nondistended, soft and nontender. No organomegaly or masses felt. Normal bowel sounds heard. Central nervous system: Alert and oriented. No focal neurological deficits. Extremities: Symmetric 5 x 5 power. Skin: No rashes, lesions or ulcers Psychiatry: Judgement and insight appear stable    Data Reviewed: I have personally reviewed following labs and imaging studies  CBC: Recent Labs  Lab  03/08/17 0456 03/09/17 0506 03/12/17 0524  WBC 6.2 6.2 6.6  HGB 10.6* 10.9* 10.9*  HCT 31.8* 32.9* 32.5*  MCV 89.8 91.1 89.8  PLT 155 163 709   Basic Metabolic Panel: Recent Labs  Lab 03/07/17 0524 03/08/17 0456 03/09/17 0506 03/10/17 0619 03/11/17 0615 03/12/17 0524  NA 138 138 138 138 138 137  K 4.6 3.9 3.6 4.0 3.6 4.1  CL 101 100* 99* 100* 101 101  CO2 24 26 24 24 22 22   GLUCOSE 107* 129* 154* 105* 128* 104*  BUN 81* 50* 62* 69* 70* 72*  CREATININE 9.68* 6.43* 6.74* 6.33* 5.75* 5.21*  CALCIUM 9.2 9.3 9.7 10.3 10.3 10.0  PHOS 7.8* 5.3* 5.8* 7.1* 6.5*  --    GFR: Estimated Creatinine Clearance: 19.5 mL/min (A) (by C-G formula based on SCr of 5.21 mg/dL (H)). Liver Function Tests: Recent Labs  Lab 03/07/17 0524 03/08/17 0456 03/09/17 0506 03/10/17 0619 03/11/17 0615  ALBUMIN 3.1* 3.1* 3.2* 3.1* 3.3*   No results for input(s): LIPASE, AMYLASE in the last 168 hours. No results for input(s): AMMONIA in the last 168 hours. Coagulation Profile: No results for input(s): INR, PROTIME in the last 168 hours. Cardiac Enzymes: No results for input(s): CKTOTAL, CKMB, CKMBINDEX, TROPONINI in the last 168 hours. BNP (last 3 results) No results for input(s): PROBNP in the last 8760 hours. HbA1C: No results for input(s): HGBA1C in the last 72 hours. CBG: No results for input(s): GLUCAP in the last 168 hours. Lipid Profile: No results for input(s): CHOL, HDL, LDLCALC, TRIG, CHOLHDL, LDLDIRECT in the last 72 hours. Thyroid Function Tests: No results for input(s): TSH, T4TOTAL, FREET4, T3FREE, THYROIDAB in the last 72 hours. Anemia Panel: No results for input(s): VITAMINB12, FOLATE, FERRITIN, TIBC, IRON, RETICCTPCT in the last 72 hours. Sepsis Labs: No results for input(s): PROCALCITON, LATICACIDVEN in the last 168 hours.  No results found for this or any previous visit (from the past 240 hour(s)).     Radiology Studies: No results found.    Scheduled Meds: .  amLODipine  10 mg Oral QHS  . dolutegravir  50 mg Oral Daily  . feeding supplement  1 Container Oral TID BM  . heparin injection (subcutaneous)  5,000 Units Subcutaneous Q8H  . multivitamin  1 tablet Oral QHS  . OLANZapine  10 mg Oral QHS  . polyethylene glycol  17 g Oral Daily  . sevelamer carbonate  800 mg Oral TID WC  . tenofovir  300 mg Oral Q Tue  . zidovudine  300 mg Oral Daily   Continuous Infusions: . clindamycin (CLEOCIN) IV       LOS: 21 days    Time spent: 20 minutes   Dessa Phi, DO [facility] [email or web address] Password [email or web address] 03/12/2017, 10:19 AM

## 2017-03-13 LAB — BASIC METABOLIC PANEL
Anion gap: 12 (ref 5–15)
BUN: 62 mg/dL — AB (ref 6–20)
CALCIUM: 9.9 mg/dL (ref 8.9–10.3)
CO2: 24 mmol/L (ref 22–32)
CREATININE: 4.3 mg/dL — AB (ref 0.61–1.24)
Chloride: 102 mmol/L (ref 101–111)
GFR calc Af Amer: 17 mL/min — ABNORMAL LOW (ref >60)
GFR calc non Af Amer: 15 mL/min — ABNORMAL LOW (ref >60)
GLUCOSE: 100 mg/dL — AB (ref 65–99)
Potassium: 4 mmol/L (ref 3.5–5.1)
Sodium: 138 mmol/L (ref 135–145)

## 2017-03-13 NOTE — Care Management Note (Signed)
Case Management Note Previous Note Created by Priscille Heidelberg  Patient Details  Name: Dylan [name] MRN: [id number] Date of Birth: 1965-09-10  Subjective/Objective:          history of HIV, schizophrenia; Admitted for rhabdomyolysis with acute renal failure.     Action/Plan: Transferred to [facility] for hemodialysis. Infectious disease following. IV-C patient. Appropriate paperwork completed, signed by MD, notarized and faxed to [employer].  PCP is Dr. Harlan Stains.  Expected Discharge Date:                  [facility]:  [facility]  In-House Referral:  Clinical Social Work  Discharge planning Services  CM Consult  Status of Service:  In process, will continue to follow   Additional Comments: 03/13/2017 Pt has been cleared by pysch and will not need psych facility at discharge.  CSW will provide pt resources for homelessness. Pt in agreement for CM to set up PCP appt with [facility]  CM arranged PCP appt with [facility] for 03/23/17 at 9am.  Pt confirmed that he gets his medications for HIV at the I&D clinic.  02/22/17 LSCW "Lorriane Shire" following for discharge placement.  Royetta Crochet. Mady Gemma, BSN, [place] [phone]  03/13/2017, 1:51 PM

## 2017-03-13 NOTE — Care Management Note (Addendum)
Case Management Note Previous Note Created by Priscille Heidelberg  Patient Details  Name: Dylan [name] MRN: [id number] Date of Birth: [birth year]  Subjective/Objective:          history of HIV, schizophrenia; Admitted for rhabdomyolysis with acute renal failure.     Action/Plan: Transferred to [facility] for hemodialysis. Infectious disease following. IV-C patient. Appropriate paperwork completed, signed by MD, notarized and faxed to [employer].  PCP is Dr. Harlan Stains.  Expected Discharge Date:                  [facility]:  [facility]  In-House Referral:  Clinical Social Work  Discharge planning Services  CM Consult  Status of Service:  In process, will continue to follow   Additional Comments: 03/13/2017 Pt has been cleared by pysch and will not need psych facility at discharge.  CSW will provide pt resources for homelessness. Pt in agreement for CM to set up PCP appt with [facility].  CM arranged PCP appt with [facility] for 03/23/17 at 9am.  Pt confirmed that he gets his medications for HIV at the I&D clinic.  Pt no longer requiring HD.  Per attending; only new medication at discharge in Zyprexa - CM provided [place] letter to bedside nurse.  CM explained to pt that HIV meds can not be MATCHED and would have to be obtained from I&D clinic.    02/22/17 LSCW "Lorriane Shire" following for discharge placement.  Royetta Crochet. Mady Gemma, BSN, [place] [phone]  03/13/2017, 2:02 PM

## 2017-03-13 NOTE — Progress Notes (Signed)
PROGRESS NOTE    Dylan [name]  UDJ:[id number] DOB: Apr 30, 1965 DOA: 02/19/2017 PCP: Carlyle Basques, MD     Brief Narrative:  Dylan [name] is a 52 year old male with history of HIV, schizophrenia who presented with a 3-week history of suicidal and homicidal ideation. He took a bunch of pills 2 days prior to admission and had been doing cocaine. He felt like he wanted to kill himself and other people. He came to the ER due to significantly decreased urine output. In the ED, he was found to have rhabdomyolysis with acute renal failure requiring HD which was started on 2/15. On 2/18, he spiked a fever and was found to have pneumonia. He has been evaluated by infectious disease for HIV medication. Psych was consulted and patient now cleared from psych standpoint. Due to need for HD, vascular surgery was consulted and planned for fistula and tunneled catheter placement on 3/4. As patient had continued improvement of kidney function, surgical procedure was canceled as he had no further need for HD.   Assessment & Plan:   Principal Problem:   AKI (acute kidney injury) ([place]) Active Problems:   HIV disease ([place])   Schizoaffective disorder, depressive type ([place])   Cocaine abuse ([place])   Rhabdomyolysis   Suicidal overdose ([place])   Acute hepatitis   Schizoaffective disorder, bipolar type ([place])   Acute renal failure (ARF) ([place])   Suicidal ideations   Chronic 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 coma ([place])   Acute renal failure requiring HD -Secondary to rhabdomyolysis/pigment associated nephropathy  -CK above 50,000--> 25,000--> 12,000--> 18,000--> 2,000--> 1,141 -Renal US; negative for hydronephrosis -Nephrology following  -Last HD 2/27 and now Cr improving  -Surgical procedure for permanent HD access canceled   Healthcare associated pneumonia -Completed IV Vanc + IV Aztreonam  Suicidal ideation / Schizoaffective disorder -Started on zyprexa. Monitor QTc -Psych  consulted and patient is cleared from psych stand point  -Stable   HIV/Chronic hep B -Now on tenofovir DF weekly every Tuesday, dolutegravir daily, zidovudine daily, stop lamivudine   HTN -Continue amlodipine  -BP stable today   Polysubstance abuse -Cocaine use, counseled against use  Constipation -Ordered bowel regimen prn     DVT prophylaxis: subq hep Code Status: full Family Communication: no family at bedside Disposition Plan: pending Nephrology plan and sign off. SW to provide resources for discharge as patient homeless    Consultants:   ID  Nephrology   Vascular surgery   Procedures:   None    Subjective: No new issues. Continues to be constipation without BM. No nausea, vomiting, abdominal pain.   Objective: Vitals:   03/12/17 2052 03/13/17 0445 03/13/17 0500 03/13/17 0822  BP: 137/89 115/76  128/72  Pulse: 98 87  88  Resp: 18 17  18   Temp: 98 F (36.7 C) 98.3 F (36.8 C)  98.5 F (36.9 C)  TempSrc: Oral Oral  Oral  SpO2: 99% 95%  98%  Weight:   94.4 kg (208 lb 1.8 oz)   Height:        Intake/Output Summary (Last 24 hours) at 03/13/2017 1054 Last data filed at 03/13/2017 0858 Gross per 24 hour  Intake 300 ml  Output 2100 ml  Net -1800 ml   Filed Weights   03/11/17 2140 03/12/17 0439 03/13/17 0500  Weight: 94.6 kg (208 lb 9.6 oz) 94.6 kg (208 lb 8.9 oz) 94.4 kg (208 lb 1.8 oz)   Examination: General exam: Appears calm and comfortable  Respiratory system:  Clear to auscultation. Respiratory effort normal. Cardiovascular system: S1 & S2 heard, RRR. No JVD, murmurs, rubs, gallops or clicks. No pedal edema. Gastrointestinal system: Abdomen is nondistended, soft and nontender. No organomegaly or masses felt. Normal bowel sounds heard. Central nervous system: Alert and oriented Extremities: Symmetric 5 x 5 power. Skin: No rashes, lesions or ulcers Psychiatry: Judgement and insight appear stable    Data Reviewed: I have personally reviewed  following labs and imaging studies  CBC: Recent Labs  Lab 03/08/17 0456 03/09/17 0506 03/12/17 0524  WBC 6.2 6.2 6.6  HGB 10.6* 10.9* 10.9*  HCT 31.8* 32.9* 32.5*  MCV 89.8 91.1 89.8  PLT 155 163 660   Basic Metabolic Panel: Recent Labs  Lab 03/07/17 0524 03/08/17 0456 03/09/17 0506 03/10/17 0619 03/11/17 0615 03/12/17 0524 03/13/17 0803  NA 138 138 138 138 138 137 138  K 4.6 3.9 3.6 4.0 3.6 4.1 4.0  CL 101 100* 99* 100* 101 101 102  CO2 24 26 24 24 22 22 24   GLUCOSE 107* 129* 154* 105* 128* 104* 100*  BUN 81* 50* 62* 69* 70* 72* 62*  CREATININE 9.68* 6.43* 6.74* 6.33* 5.75* 5.21* 4.30*  CALCIUM 9.2 9.3 9.7 10.3 10.3 10.0 9.9  PHOS 7.8* 5.3* 5.8* 7.1* 6.5*  --   --    GFR: Estimated Creatinine Clearance: 23.6 mL/min (A) (by C-G formula based on SCr of 4.3 mg/dL (H)). Liver Function Tests: Recent Labs  Lab 03/07/17 0524 03/08/17 0456 03/09/17 0506 03/10/17 0619 03/11/17 0615  ALBUMIN 3.1* 3.1* 3.2* 3.1* 3.3*   No results for input(s): LIPASE, AMYLASE in the last 168 hours. No results for input(s): AMMONIA in the last 168 hours. Coagulation Profile: No results for input(s): INR, PROTIME in the last 168 hours. Cardiac Enzymes: No results for input(s): CKTOTAL, CKMB, CKMBINDEX, TROPONINI in the last 168 hours. BNP (last 3 results) No results for input(s): PROBNP in the last 8760 hours. HbA1C: No results for input(s): HGBA1C in the last 72 hours. CBG: No results for input(s): GLUCAP in the last 168 hours. Lipid Profile: No results for input(s): CHOL, HDL, LDLCALC, TRIG, CHOLHDL, LDLDIRECT in the last 72 hours. Thyroid Function Tests: No results for input(s): TSH, T4TOTAL, FREET4, T3FREE, THYROIDAB in the last 72 hours. Anemia Panel: No results for input(s): VITAMINB12, FOLATE, FERRITIN, TIBC, IRON, RETICCTPCT in the last 72 hours. Sepsis Labs: No results for input(s): PROCALCITON, LATICACIDVEN in the last 168 hours.  No results found for this or any  previous visit (from the past 240 hour(s)).     Radiology Studies: No results found.    Scheduled Meds: . amLODipine  10 mg Oral QHS  . dolutegravir  50 mg Oral Daily  . feeding supplement  1 Container Oral TID BM  . heparin injection (subcutaneous)  5,000 Units Subcutaneous Q8H  . multivitamin  1 tablet Oral QHS  . OLANZapine  10 mg Oral QHS  . polyethylene glycol  17 g Oral Daily  . senna  1 tablet Oral Daily  . sevelamer carbonate  800 mg Oral TID WC  . sorbitol  30 mL Oral TID  . tenofovir  300 mg Oral Q Tue  . zidovudine  300 mg Oral Daily   Continuous Infusions:    LOS: 22 days    Time spent: 20 minutes   Dessa Phi, DO [facility] [email or web address] Password [facility] 03/13/2017, 10:54 AM

## 2017-03-13 NOTE — Progress Notes (Addendum)
Subjective:  No cos / no results with sorbitol ,but no abd discomfort  Or N/V  Objective Vital signs in last 24 hours: Vitals:   03/12/17 2052 03/13/17 0445 03/13/17 0500 03/13/17 0822  BP: 137/89 115/76  128/72  Pulse: 98 87  88  Resp: 18 17  18   Temp: 98 F (36.7 C) 98.3 F (36.8 C)  98.5 F (36.9 C)  TempSrc: Oral Oral  Oral  SpO2: 99% 95%  98%  Weight:   94.4 kg (208 lb 1.8 oz)   Height:       Weight change: -0.22 kg (-7.8 oz)  Physical Exam: General: laert in bed NAD  OX3,  Heart: RRR no m, r, g  Lungs: CAT   Abdomen: BS pos , soft , nt, nd  Extremities: no pedal edema  Dialysis Access: R IJ Tem cath    Problem/Plan: 1. Acute kidney injury:Secondary to rhabdomyolysis/pigment associated nephropathy.  Recovery phase now, last HD 2/27. Creat down to 4.3.  Will have HD cath removed, should see continued improvement.  Will sign off. 2. Healthcare associated pneumonia: Status post completion of intravenous vancomycin and aztreonam-stop date was 03/01/17. Currently remains afebrile and without cough/shortness of breath. 3 HTN/VOL - ,on Amlodipine 10 mg q  hs  stable / no uop recorded  4. HIV infection:On antiretroviral therapy, management/adjustments per primary service and infectious diseases. 5Schizoaffective disorder:On Zyprexa awaiting medical stability prior to transfer to inpatient psychiatry unit. 6. Hyperphosphatemia: Secondary to acute kidney injury, s/p 3 day course aluminum hydroxide. Phos elevated -6.5  added Renvela binder 3/02 7. 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  Kelly Splinter  MD [facility] pager [phone]    cell [phone] 03/13/2017, 12:54 PM     Labs: Basic Metabolic Panel: Recent Labs  Lab 03/09/17 0506 03/10/17 0619 03/11/17 0615 03/12/17 0524 03/13/17 0803  NA 138 138 138 137 138  K 3.6 4.0 3.6 4.1 4.0  CL 99* 100* 101 101 102  CO2 24 24 22 22 24   GLUCOSE 154* 105* 128* 104* 100*  BUN 62* 69* 70* 72* 62*  CREATININE 6.74* 6.33* 5.75* 5.21* 4.30*  CALCIUM 9.7 10.3 10.3 10.0 9.9  PHOS 5.8* 7.1* 6.5*  --   --    Liver Function Tests: Recent Labs  Lab 03/09/17 0506 03/10/17 0619 03/11/17 0615  ALBUMIN 3.2* 3.1* 3.3*   No results for input(s): LIPASE, AMYLASE in the last 168 hours. No results for input(s): AMMONIA in the last 168 hours. CBC: Recent Labs  Lab 03/08/17 0456 03/09/17 0506 03/12/17 0524  WBC 6.2 6.2 6.6  HGB 10.6* 10.9* 10.9*  HCT 31.8* 32.9* 32.5*  MCV 89.8 91.1 89.8  PLT 155 163 159   Cardiac Enzymes: No results for input(s): CKTOTAL, CKMB, CKMBINDEX, TROPONINI in the last 168 hours. CBG: No results for input(s): GLUCAP in the last 168 hours.  Studies/Results: No results found. Medications:  . amLODipine  10 mg Oral QHS  . dolutegravir  50 mg Oral Daily  . feeding supplement  1 Container Oral TID BM  . heparin injection (subcutaneous)  5,000 Units Subcutaneous Q8H  .  multivitamin  1 tablet Oral QHS  . OLANZapine  10 mg Oral QHS  . polyethylene glycol  17 g Oral Daily  . senna  1 tablet Oral Daily  . sevelamer carbonate  800 mg Oral TID WC  . sorbitol  30 mL Oral TID  . tenofovir  300 mg Oral Q Tue  . zidovudine  300 mg Oral Daily

## 2017-03-14 LAB — BASIC METABOLIC PANEL
ANION GAP: 12 (ref 5–15)
BUN: 54 mg/dL — AB (ref 6–20)
CHLORIDE: 103 mmol/L (ref 101–111)
CO2: 24 mmol/L (ref 22–32)
Calcium: 9.7 mg/dL (ref 8.9–10.3)
Creatinine, Ser: 3.89 mg/dL — ABNORMAL HIGH (ref 0.61–1.24)
GFR calc Af Amer: 19 mL/min — ABNORMAL LOW (ref >60)
GFR, EST NON AFRICAN AMERICAN: 17 mL/min — AB (ref >60)
Glucose, Bld: 110 mg/dL — ABNORMAL HIGH (ref 65–99)
POTASSIUM: 3.9 mmol/L (ref 3.5–5.1)
Sodium: 139 mmol/L (ref 135–145)

## 2017-03-14 MED ORDER — POLYETHYLENE GLYCOL 3350 17 G PO PACK: 17.0000 g | PACK | Freq: Every day | ORAL | 0 refills | Status: DC | PRN

## 2017-03-14 MED ORDER — AMLODIPINE BESYLATE 10 MG PO TABS: 10.0000 mg | ORAL_TABLET | Freq: Every day | ORAL | 0 refills | Status: DC

## 2017-03-14 MED ORDER — OLANZAPINE 10 MG PO TABS: 10.0000 mg | ORAL_TABLET | Freq: Every day | ORAL | 0 refills | Status: DC

## 2017-03-14 MED FILL — OLANZapine 10 MG TABS: 10 | 30 days supply | Qty: 30 | Fill #0

## 2017-03-14 MED FILL — AMLODIPINE BESYLATE 10 MG T: 10 | 30 days supply | Qty: 30 | Fill #0

## 2017-03-14 NOTE — Clinical Social Work Note (Signed)
CSW contacted Mali Genzlinger, PPO II with [place] D[employer] to inform him that patient is discharging today. Mali reported that he has talked with patient and he has instructions to come directly to see him at [address] after discharge. Nurse informed of call with probation officer and provided with bus ticket to give to patient after eats lunch and is ready to leave.  Temple Ewart Givens, MSW, LCSW Licensed Clinical Social Worker [place] [phone]

## 2017-03-14 NOTE — Progress Notes (Signed)
Patient discharged at 1:35 pm, bus pass provided, medication provided, IV removed, tele box returned.

## 2017-03-15 ENCOUNTER — Telehealth: Payer: Self-pay | Admitting: *Deleted

## 2017-03-15 NOTE — Telephone Encounter (Signed)
Perfect thank you!

## 2017-03-15 NOTE — Telephone Encounter (Signed)
-----   Message from [place], [place] sent at 03/14/2017 10:47 AM EST ----- Patient missed appointment this week and will be discharged today at some point. Can we please make a new appointment for him?  Thank you!

## 2017-03-15 NOTE — Telephone Encounter (Signed)
Unable to contact patient. He was discharged before I saw this message, no phone number listed in chart. Patient was instructed at discharge by discharging provider and nurse to follow up at [facility] for his medications. Landis Gandy, RN

## 2017-03-20 ENCOUNTER — Ambulatory Visit (INDEPENDENT_AMBULATORY_CARE_PROVIDER_SITE_OTHER): Payer: Self-pay | Admitting: Internal Medicine

## 2017-03-20 ENCOUNTER — Encounter: Payer: Self-pay | Admitting: Internal Medicine

## 2017-03-20 ENCOUNTER — Telehealth: Payer: Self-pay | Admitting: *Deleted

## 2017-03-20 VITALS — BP 117/80 | HR 118 | Temp 98.0°F | Ht 74.0 in | Wt 217.0 lb

## 2017-03-20 DIAGNOSIS — Z915 Personal history of self-harm: Secondary | ICD-10-CM

## 2017-03-20 DIAGNOSIS — Z9151 Personal history of suicidal behavior: Secondary | ICD-10-CM

## 2017-03-20 DIAGNOSIS — B20 Human immunodeficiency virus [HIV] disease: Secondary | ICD-10-CM

## 2017-03-20 DIAGNOSIS — N179 Acute kidney failure, unspecified: Secondary | ICD-10-CM

## 2017-03-20 NOTE — Telephone Encounter (Signed)
Are we keeping him on Bicktarvy and if so can I send in refills to [facility]?

## 2017-03-20 NOTE — Progress Notes (Signed)
RFV: follow up for hiv disease   Patient ID: Dylan [name], male   DOB: 05/06/1965, 52 y.o.   MRN: [id number]  HPI 52yo M with hiv disease, who was admitted for si, overdose on his hiv medications sustained rhabdomyolysis and temporary aki requiring HD but now regaining kidney function.  Outpatient Encounter Medications as of 03/20/2017  Medication Sig  . amLODipine (NORVASC) 10 MG tablet Take 1 tablet (10 mg total) by mouth at bedtime.  . bictegravir-emtricitabine-tenofovir AF (BIKTARVY) 50-200-25 MG TABS tablet Take 1 tablet by mouth daily.  Marland Kitchen OLANZapine (ZYPREXA) 10 MG tablet Take 1 tablet (10 mg total) by mouth at bedtime.  Marland Kitchen albuterol (PROVENTIL HFA;VENTOLIN HFA) 108 (90 BASE) MCG/ACT inhaler Inhale 2 puffs into the lungs every 6 (six) hours as needed for wheezing or shortness of breath.  . polyethylene glycol (MIRALAX / GLYCOLAX) packet Take 17 g by mouth daily as needed.  . traZODone (DESYREL) 100 MG tablet Take 2 tablets (200 mg total) by mouth at bedtime. For insomnia. (Patient not taking: Reported on 03/20/2017)   No facility-administered encounter medications on file as of 03/20/2017.      Patient Active Problem List   Diagnosis Date Noted  . Schizoaffective disorder, bipolar type ([place])   . Acute renal failure (ARF) ([place])   . Suicidal ideations   . Chronic 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 coma ([place])   . AKI (acute kidney injury) ([place]) 02/19/2017  . Rhabdomyolysis 02/19/2017  . Suicidal overdose ([place]) 02/19/2017  . Acute hepatitis 02/19/2017  . Elevated LFTs   . Homicidal ideations   . Schizoaffective disorder, depressive type ([place]) 09/05/2011  . Cocaine abuse ([place]) 09/05/2011  . Degenerative disc disease 08/21/2011  . HIV disease ([place]) 08/17/2011     Health Maintenance Due  Topic Date Due  . TETANUS/TDAP  08/01/1984  . COLONOSCOPY  08/02/2015     Review of Systems No thoughts of harm to self or others. 12 point ros is negative Physical  Exam   Ht 6\' 2"  (1.88 m)   Wt 217 lb (98.4 kg)   BMI 27.86 kg/m   Physical Exam  Constitutional: He is oriented to person, place, and time. He appears well-developed and well-nourished. No distress.  HENT:  Mouth/Throat: Oropharynx is clear and moist. No oropharyngeal exudate.  Cardiovascular: Normal rate, regular rhythm and normal heart sounds. Exam reveals no gallop and no friction rub.  No murmur heard.  Pulmonary/Chest: Effort normal and breath sounds normal. No respiratory distress. He has no wheezes.  Abdominal: Soft. Bowel sounds are normal. He exhibits no distension. There is no tenderness.  Lymphadenopathy:  He has no cervical adenopathy.  Neurological: He is alert and oriented to person, place, and time.  Skin: Skin is warm and dry. No rash noted. No erythema.  Psychiatric: He has a normal mood and affect. His behavior is normal.    Lab Results  Component Value Date   CD4TCELL 24 (L) 02/08/2012   Lab Results  Component Value Date   CD4TABS 600 02/08/2012   CD4TABS 500 11/09/2011   CD4TABS 560 08/18/2011   Lab Results  Component Value Date   HIV1RNAQUANT <20 02/21/2017   Lab Results  Component Value Date   HEPBSAB NEG 08/18/2011   Lab Results  Component Value Date   LABRPR NON REAC 08/18/2011    CBC Lab Results  Component Value Date   WBC 6.6 03/12/2017   RBC 3.62 (L) 03/12/2017   HGB 10.9 (L) 03/12/2017  HCT 32.5 (L) 03/12/2017   PLT 159 03/12/2017   MCV 89.8 03/12/2017   MCH 30.1 03/12/2017   MCHC 33.5 03/12/2017   RDW 12.5 03/12/2017   LYMPHSABS 1.8 02/19/2017   MONOABS 1.8 (H) 02/19/2017   EOSABS 0.0 02/19/2017    BMET Lab Results  Component Value Date   NA 139 03/14/2017   K 3.9 03/14/2017   CL 103 03/14/2017   CO2 24 03/14/2017   GLUCOSE 110 (H) 03/14/2017   BUN 54 (H) 03/14/2017   CREATININE 3.89 (H) 03/14/2017   CALCIUM 9.7 03/14/2017   GFRNONAA 17 (L) 03/14/2017   GFRAA 19 (L) 03/14/2017      Assessment and Plan HIV  disease = continue on biktary  aki = will repeat his bmp to see that his cr function continues to improve and no need for dose adjustment  Food insecurities Will give supplemental food  History of suicide attempt = continue to be in care with [facility]/mental health services  Homelessness = will have him see if any resources available through [facility]

## 2017-03-21 LAB — BASIC METABOLIC PANEL
BUN/Creatinine Ratio: 9 (calc) (ref 6–22)
BUN: 20 mg/dL (ref 7–25)
CALCIUM: 9.1 mg/dL (ref 8.6–10.3)
CO2: 23 mmol/L (ref 20–32)
CREATININE: 2.35 mg/dL — AB (ref 0.70–1.33)
Chloride: 102 mmol/L (ref 98–110)
Glucose, Bld: 90 mg/dL (ref 65–99)
POTASSIUM: 4.1 mmol/L (ref 3.5–5.3)
SODIUM: 136 mmol/L (ref 135–146)

## 2017-03-23 ENCOUNTER — Ambulatory Visit: Payer: Self-pay | Admitting: Family Medicine

## 2017-03-23 ENCOUNTER — Emergency Department (HOSPITAL_COMMUNITY)
Admission: EM | Admit: 2017-03-23 | Discharge: 2017-03-24 | Disposition: A | Discharge status: Rehabilitation Facility | Payer: Self-pay | Attending: Emergency Medicine | Admitting: Emergency Medicine

## 2017-03-23 ENCOUNTER — Emergency Department (HOSPITAL_COMMUNITY): Payer: Self-pay

## 2017-03-23 ENCOUNTER — Encounter (HOSPITAL_COMMUNITY): Payer: Self-pay | Admitting: Emergency Medicine

## 2017-03-23 ENCOUNTER — Other Ambulatory Visit: Payer: Self-pay

## 2017-03-23 DIAGNOSIS — R609 Edema, unspecified: Secondary | ICD-10-CM

## 2017-03-23 DIAGNOSIS — Z79899 Other long term (current) drug therapy: Secondary | ICD-10-CM | POA: Insufficient documentation

## 2017-03-23 DIAGNOSIS — R2243 Localized swelling, mass and lump, lower limb, bilateral: Secondary | ICD-10-CM | POA: Insufficient documentation

## 2017-03-23 DIAGNOSIS — F17210 Nicotine dependence, cigarettes, uncomplicated: Secondary | ICD-10-CM | POA: Insufficient documentation

## 2017-03-23 DIAGNOSIS — Z21 Asymptomatic human immunodeficiency virus [HIV] infection status: Secondary | ICD-10-CM | POA: Insufficient documentation

## 2017-03-23 DIAGNOSIS — Z91040 Latex allergy status: Secondary | ICD-10-CM | POA: Insufficient documentation

## 2017-03-23 NOTE — ED Triage Notes (Signed)
Patient is here to be medically cleared for [facility]. Patient states that both his legs are swollen and it hurts to walk.

## 2017-03-24 ENCOUNTER — Encounter (HOSPITAL_COMMUNITY): Payer: Self-pay | Admitting: Emergency Medicine

## 2017-03-24 ENCOUNTER — Other Ambulatory Visit: Payer: Self-pay

## 2017-03-24 LAB — COMPREHENSIVE METABOLIC PANEL
ALT: 33 U/L (ref 17–63)
AST: 25 U/L (ref 15–41)
Albumin: 3.5 g/dL (ref 3.5–5.0)
Alkaline Phosphatase: 116 U/L (ref 38–126)
Anion gap: 9 (ref 5–15)
BUN: 26 mg/dL — ABNORMAL HIGH (ref 6–20)
CHLORIDE: 108 mmol/L (ref 101–111)
CO2: 23 mmol/L (ref 22–32)
CREATININE: 1.91 mg/dL — AB (ref 0.61–1.24)
Calcium: 9.1 mg/dL (ref 8.9–10.3)
GFR calc Af Amer: 45 mL/min — ABNORMAL LOW (ref >60)
GFR, EST NON AFRICAN AMERICAN: 39 mL/min — AB (ref >60)
Glucose, Bld: 104 mg/dL — ABNORMAL HIGH (ref 65–99)
Potassium: 3.7 mmol/L (ref 3.5–5.1)
Sodium: 140 mmol/L (ref 135–145)
Total Bilirubin: 0.2 mg/dL — ABNORMAL LOW (ref 0.3–1.2)
Total Protein: 7.6 g/dL (ref 6.5–8.1)

## 2017-03-24 LAB — I-STAT CHEM 8, ED
BUN: 24 mg/dL — ABNORMAL HIGH (ref 6–20)
CREATININE: 1.8 mg/dL — AB (ref 0.61–1.24)
Calcium, Ion: 1.19 mmol/L (ref 1.15–1.40)
Chloride: 106 mmol/L (ref 101–111)
Glucose, Bld: 102 mg/dL — ABNORMAL HIGH (ref 65–99)
HCT: 28 % — ABNORMAL LOW (ref 39.0–52.0)
HEMOGLOBIN: 9.5 g/dL — AB (ref 13.0–17.0)
Potassium: 3.7 mmol/L (ref 3.5–5.1)
Sodium: 142 mmol/L (ref 135–145)
TCO2: 24 mmol/L (ref 22–32)

## 2017-03-24 LAB — CBC WITH DIFFERENTIAL/PLATELET
BASOS ABS: 0.1 10*3/uL (ref 0.0–0.1)
BASOS PCT: 1 %
Eosinophils Absolute: 0.2 10*3/uL (ref 0.0–0.7)
Eosinophils Relative: 3 %
HEMATOCRIT: 26.7 % — AB (ref 39.0–52.0)
HEMOGLOBIN: 9 g/dL — AB (ref 13.0–17.0)
Lymphocytes Relative: 41 %
Lymphs Abs: 2.5 10*3/uL (ref 0.7–4.0)
MCH: 30.6 pg (ref 26.0–34.0)
MCHC: 33.7 g/dL (ref 30.0–36.0)
MCV: 90.8 fL (ref 78.0–100.0)
MONO ABS: 0.7 10*3/uL (ref 0.1–1.0)
Monocytes Relative: 11 %
NEUTROS ABS: 2.6 10*3/uL (ref 1.7–7.7)
Neutrophils Relative %: 44 %
Platelets: 242 10*3/uL (ref 150–400)
RBC: 2.94 MIL/uL — ABNORMAL LOW (ref 4.22–5.81)
RDW: 13.6 % (ref 11.5–15.5)
WBC: 6 10*3/uL (ref 4.0–10.5)

## 2017-03-24 LAB — RAPID URINE DRUG SCREEN, HOSP PERFORMED
AMPHETAMINES: NOT DETECTED
BARBITURATES: NOT DETECTED
BENZODIAZEPINES: NOT DETECTED
COCAINE: NOT DETECTED
Opiates: NOT DETECTED
TETRAHYDROCANNABINOL: NOT DETECTED

## 2017-03-24 LAB — ETHANOL: Alcohol, Ethyl (B): <10 mg/dL (ref <10)

## 2017-03-24 LAB — CK: Total CK: 456 U/L — ABNORMAL HIGH (ref 49–397)

## 2017-03-24 LAB — SALICYLATE LEVEL

## 2017-03-24 LAB — ACETAMINOPHEN LEVEL: Acetaminophen (Tylenol), Serum: <10 ug/mL — ABNORMAL LOW (ref 10–30)

## 2017-03-24 NOTE — ED Notes (Signed)
Called [facility] to pick up patient. Beverly Sessions will call with transportation for patient.

## 2017-03-24 NOTE — ED Provider Notes (Signed)
[facility] DEPT Provider Note   CSN: [id number] Arrival date & time: 03/23/17  2123     History   Chief Complaint Chief Complaint  Patient presents with  . Leg Pain    HPI Dylan [name] is a 52 y.o. male.  The history is provided by the patient.  Leg Pain   This is a new problem. The current episode started more than 1 week ago. The problem occurs constantly. The problem has not changed since onset.Pain location: B feet and ankles. The quality of the pain is described as aching. The pain is moderate. Pertinent negatives include no numbness. He has tried nothing for the symptoms. The treatment provided no relief. There has been no history of extremity trauma. Family history is significant for no rheumatoid arthritis.  Patient who was admitted for AKI states he has had swelling since prior to discharge.  Cr. Has improved for 13 to 1.8.  Patient is not icing or elevating the legs and was sent from [facility] for evaluation.    Past Medical History:  Diagnosis Date  . Arthritis   . Bipolar 1 disorder ([place])   . Depression   . Hepatitis B   . Human immunodeficiency virus (HIV) ([place])   . Schizophrenia [facility])     Patient Active Problem List   Diagnosis Date Noted  . Schizoaffective disorder, bipolar type ([place])   . Acute renal failure (ARF) ([place])   . Suicidal ideations   . Chronic 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 coma ([place])   . AKI (acute kidney injury) ([place]) 02/19/2017  . Rhabdomyolysis 02/19/2017  . Suicidal overdose ([place]) 02/19/2017  . Acute hepatitis 02/19/2017  . Elevated LFTs   . Homicidal ideations   . Schizoaffective disorder, depressive type ([place]) 09/05/2011  . Cocaine abuse ([place]) 09/05/2011  . Degenerative disc disease 08/21/2011  . HIV disease ([place]) 08/17/2011    Past Surgical History:  Procedure Laterality Date  . IR FLUORO GUIDE CV LINE RIGHT  02/22/2017  . IR US GUIDE VASC ACCESS RIGHT  02/22/2017  . TUMOR  REMOVAL     From Chest       Home Medications    Prior to Admission medications   Medication Sig Start Date End Date Taking? Authorizing Provider  albuterol (PROVENTIL HFA;VENTOLIN HFA) 108 (90 BASE) MCG/ACT inhaler Inhale 2 puffs into the lungs every 6 (six) hours as needed for wheezing or shortness of breath. 09/14/11 09/13/12  Readling, Milana Huntsman, MD  amLODipine (NORVASC) 10 MG tablet Take 1 tablet (10 mg total) by mouth at bedtime. 03/14/17   Domenic Polite, MD  bictegravir-emtricitabine-tenofovir AF (BIKTARVY) 50-200-25 MG TABS tablet Take 1 tablet by mouth daily.    [provider]  OLANZapine (ZYPREXA) 10 MG tablet Take 1 tablet (10 mg total) by mouth at bedtime. 03/14/17   Domenic Polite, MD  polyethylene glycol [facility] / Floria Raveling) packet Take 17 g by mouth daily as needed. 03/14/17   Domenic Polite, MD  traZODone (DESYREL) 100 MG tablet Take 2 tablets (200 mg total) by mouth at bedtime. For insomnia. Patient not taking: Reported on 03/20/2017 04/19/12   Pamelia Hoit    Family History History reviewed. No pertinent family history.  Social History Social History   Tobacco Use  . Smoking status: Current Every Day Smoker    Packs/day: 1.00    Years: 15.00    Pack years: 15.00    Types: Cigarettes  . Smokeless tobacco: Current User  Substance Use Topics  . Alcohol use: No    Alcohol/week: 0.0 oz    Comment: past  . Drug use: Yes    Types: Cocaine    Comment: past     Allergies   Penicillins; Latex; and Tape   Review of Systems Review of Systems  Respiratory: Negative for chest tightness and shortness of breath.   Cardiovascular: Positive for leg swelling. Negative for chest pain and palpitations.  Genitourinary: Negative for flank pain.  Neurological: Negative for numbness.  All other systems reviewed and are negative.    Physical Exam Updated Vital Signs BP (!) 147/93 (BP Location: Left Arm)   Pulse 77   Temp 98.3 F (36.8 C) (Oral)   Resp  20   Ht _0  (1.88 m)   Wt 99.8 kg (220 lb)   SpO2 99%   BMI 28.25 kg/m   Physical Exam  Constitutional: He is oriented to person, place, and time. He appears well-developed and well-nourished. No distress.  HENT:  Head: Normocephalic and atraumatic.  Mouth/Throat: No oropharyngeal exudate.  Eyes: Conjunctivae are normal. Pupils are equal, round, and reactive to light.  Neck: Normal range of motion. Neck supple.  Cardiovascular: Normal rate, regular rhythm, normal heart sounds and intact distal pulses.  Pulmonary/Chest: Effort normal and breath sounds normal. No stridor. He has no wheezes. He has no rales.  Abdominal: Soft. Bowel sounds are normal. He exhibits no mass. There is no tenderness. There is no rebound and no guarding.  Musculoskeletal: He exhibits edema.       Right knee: Normal.       Left knee: Normal.       Right ankle: Normal. Achilles tendon normal.       Left ankle: Normal. Achilles tendon normal.       Right lower leg: He exhibits edema.       Left lower leg: He exhibits edema.       Right foot: There is swelling. There is normal range of motion, no tenderness, no bony tenderness, normal capillary refill, no crepitus, no deformity and no laceration.       Left foot: There is swelling. There is normal range of motion, no tenderness, no bony tenderness, normal capillary refill, no crepitus, no deformity and no laceration.  Dependent, non pitting to the shins.  No warmth, no erythema, no wounds, all compartments are soft.    Neurological: He is alert and oriented to person, place, and time.  Skin: Skin is warm and dry. Capillary refill takes less than 2 seconds.     ED Treatments / Results  Labs (all labs ordered are listed, but only abnormal results are displayed)  Results for orders placed or performed during the hospital encounter of 03/23/17  CBC with Differential/Platelet  Result Value Ref Range   WBC 6.0 4.0 - 10.5 K/uL   RBC 2.94 (L) 4.22 - 5.81 MIL/uL     Hemoglobin 9.0 (L) 13.0 - 17.0 g/dL   HCT 26.7 (L) 39.0 - 52.0 %   MCV 90.8 78.0 - 100.0 fL   MCH 30.6 26.0 - 34.0 pg   MCHC 33.7 30.0 - 36.0 g/dL   RDW 13.6 11.5 - 15.5 %   Platelets 242 150 - 400 K/uL   Neutrophils Relative % 44 %   Neutro Abs 2.6 1.7 - 7.7 K/uL   Lymphocytes Relative 41 %   Lymphs Abs 2.5 0.7 - 4.0 K/uL   Monocytes Relative 11 %   Monocytes Absolute 0.7  0.1 - 1.0 K/uL   Eosinophils Relative 3 %   Eosinophils Absolute 0.2 0.0 - 0.7 K/uL   Basophils Relative 1 %   Basophils Absolute 0.1 0.0 - 0.1 K/uL  Comprehensive metabolic panel  Result Value Ref Range   Sodium 140 135 - 145 mmol/L   Potassium 3.7 3.5 - 5.1 mmol/L   Chloride 108 101 - 111 mmol/L   CO2 23 22 - 32 mmol/L   Glucose, Bld 104 (H) 65 - 99 mg/dL   BUN 26 (H) 6 - 20 mg/dL   Creatinine, Ser 1.91 (H) 0.61 - 1.24 mg/dL   Calcium 9.1 8.9 - 10.3 mg/dL   Total Protein 7.6 6.5 - 8.1 g/dL   Albumin 3.5 3.5 - 5.0 g/dL   AST 25 15 - 41 U/L   ALT 33 17 - 63 U/L   Alkaline Phosphatase 116 38 - 126 U/L   Total Bilirubin 0.2 (L) 0.3 - 1.2 mg/dL   GFR calc non Af Amer 39 (L) >60 mL/min   GFR calc Af Amer 45 (L) >60 mL/min   Anion gap 9 5 - 15  Salicylate level  Result Value Ref Range   Salicylate Lvl <4.0 2.8 - 30.0 mg/dL  Acetaminophen level  Result Value Ref Range   Acetaminophen (Tylenol), Serum <10 (L) 10 - 30 ug/mL  Ethanol  Result Value Ref Range   Alcohol, Ethyl (B) <10 <10 mg/dL  Urine rapid drug screen (hosp performed)  Result Value Ref Range   Opiates NONE DETECTED NONE DETECTED   Cocaine NONE DETECTED NONE DETECTED   Benzodiazepines NONE DETECTED NONE DETECTED   Amphetamines NONE DETECTED NONE DETECTED   Tetrahydrocannabinol NONE DETECTED NONE DETECTED   Barbiturates NONE DETECTED NONE DETECTED  CK  Result Value Ref Range   Total CK 456 (H) 49 - 397 U/L  I-Stat Chem 8, ED  Result Value Ref Range   Sodium 142 135 - 145 mmol/L   Potassium 3.7 3.5 - 5.1 mmol/L   Chloride 106 101 -  111 mmol/L   BUN 24 (H) 6 - 20 mg/dL   Creatinine, Ser 1.80 (H) 0.61 - 1.24 mg/dL   Glucose, Bld 102 (H) 65 - 99 mg/dL   Calcium, Ion 1.19 1.15 - 1.40 mmol/L   TCO2 24 22 - 32 mmol/L   Hemoglobin 9.5 (L) 13.0 - 17.0 g/dL   HCT 28.0 (L) 39.0 - 52.0 %   Dg Chest 2 View  Result Date: 03/23/2017 CLINICAL DATA:  Leg swelling query fluid retention. EXAM: CHEST - 2 VIEW COMPARISON:  03/01/2017 FINDINGS: The heart size and mediastinal contours are within normal limits. Both lungs are clear. The visualized skeletal structures are unremarkable. IMPRESSION: No active cardiopulmonary disease. Electronically Signed   By: Ashley Royalty M.D.   On: 03/23/2017 23:49   Dg Abd 1 View  Result Date: 02/23/2017 CLINICAL DATA:  Increased vomiting EXAM: ABDOMEN - 1 VIEW COMPARISON:  None. FINDINGS: Scattered large and small bowel gas is noted. No free air is noted. No obstructive changes are seen. No bony abnormality is noted. IMPRESSION: No acute abnormality seen. Electronically Signed   By: Inez Catalina M.D.   On: 02/23/2017 08:36   Ir Fluoro Guide Cv Line Right  Result Date: 02/22/2017 INDICATION: 52 year old male with a history of renal disease EXAM: IMAGE GUIDED TEMPORARY HEMODIALYSIS CATHETER PLACEMENT MEDICATIONS: None ANESTHESIA/SEDATION: None FLUOROSCOPY TIME:  Fluoroscopy Time: 0 minutes 6 seconds (3 mGy). COMPLICATIONS: None PROCEDURE: Informed written consent was obtained from the patient  after a discussion of the risks, benefits, and alternatives to treatment. Questions regarding the procedure were encouraged and answered. The right neck was prepped with chlorhexidine in a sterile fashion, and a sterile drape was applied covering the operative field. Maximum barrier sterile technique with sterile gowns and gloves were used for the procedure. A timeout was performed prior to the initiation of the procedure. A micropuncture kit was utilized to access the right internal jugular vein under direct, real-time  ultrasound guidance after the overlying soft tissues were anesthetized with 1% lidocaine with epinephrine. Ultrasound image documentation was performed. The microwire was kinked to measure appropriate catheter length. A stiff glidewire was advanced to the level of the IVC. A 20 cm hemodialysis catheter was then placed over the wire. Final catheter positioning was confirmed and documented with a spot radiographic image. The catheter aspirates and flushes normally. The catheter was flushed with appropriate volume heparin dwells. Dressings were applied. The patient tolerated the procedure well without immediate post procedural complication. IMPRESSION: Status post placement of right IJ temporary hemodialysis catheter. Catheter may be converted if need be. Signed, Dulcy Fanny. Earleen Newport, DO [facility] Electronically Signed   By: Corrie Mckusick D.O.   On: 02/22/2017 17:14   Ir US Guide Vasc Access Right  Result Date: 02/22/2017 INDICATION: 52 year old male with a history of renal disease EXAM: IMAGE GUIDED TEMPORARY HEMODIALYSIS CATHETER PLACEMENT MEDICATIONS: None ANESTHESIA/SEDATION: None FLUOROSCOPY TIME:  Fluoroscopy Time: 0 minutes 6 seconds (3 mGy). COMPLICATIONS: None PROCEDURE: Informed written consent was obtained from the patient after a discussion of the risks, benefits, and alternatives to treatment. Questions regarding the procedure were encouraged and answered. The right neck was prepped with chlorhexidine in a sterile fashion, and a sterile drape was applied covering the operative field. Maximum barrier sterile technique with sterile gowns and gloves were used for the procedure. A timeout was performed prior to the initiation of the procedure. A micropuncture kit was utilized to access the right internal jugular vein under direct, real-time ultrasound guidance after the overlying soft tissues were anesthetized with 1% lidocaine with epinephrine.  Ultrasound image documentation was performed. The microwire was kinked to measure appropriate catheter length. A stiff glidewire was advanced to the level of the IVC. A 20 cm hemodialysis catheter was then placed over the wire. Final catheter positioning was confirmed and documented with a spot radiographic image. The catheter aspirates and flushes normally. The catheter was flushed with appropriate volume heparin dwells. Dressings were applied. The patient tolerated the procedure well without immediate post procedural complication. IMPRESSION: Status post placement of right IJ temporary hemodialysis catheter. Catheter may be converted if need be. Signed, Dulcy Fanny. Earleen Newport, DO [facility] Electronically Signed   By: Corrie Mckusick D.O.   On: 02/22/2017 17:14   Dg Chest Port 1 View  Result Date: 03/01/2017 CLINICAL DATA:  52 year old male with renal failure, shortness of breath, HIV. EXAM: PORTABLE CHEST 1 VIEW COMPARISON:  02/26/2017 and earlier. FINDINGS: Portable AP semi upright view at 0938 hrs. Stable right IJ approach dialysis type catheter. Improved lung volumes since 02/26/2017. Regressed pulmonary vascularity. Improved bibasilar ventilation. No pneumothorax, pulmonary edema, pleural effusion or confluent pulmonary opacity identified. Stable borderline to mild cardiomegaly. Other mediastinal contours remain normal. Visualized tracheal air column is within normal limits. Paucity bowel gas in the upper abdomen. IMPRESSION: 1. Resolved interstitial edema and lung base hypoventilation since 02/26/2017. 2.  No acute cardiopulmonary abnormality. Electronically Signed  By: Genevie Ann M.D.   On: 03/01/2017 10:06   Dg Chest Port 1 View  Result Date: 02/26/2017 CLINICAL DATA:  Fever [id number], SOB x "few days", no CP EXAM: PORTABLE CHEST 1 VIEW COMPARISON:  02/19/2017 FINDINGS: Patient's right IJ sheath, tip overlying the level of the cavoatrial junction. The  heart is mildly enlarged. There are perihilar opacifications consistent with pulmonary edema. More focal opacity in the medial left lung base may represent atelectasis, infiltrate, or confluent edema. IMPRESSION: 1. Findings consistent with mild edema. 2. Left lower lobe opacity likely representing infectious infiltrate. Electronically Signed   By: Nolon Nations M.D.   On: 02/26/2017 13:13    Radiology Dg Chest 2 View  Result Date: 03/23/2017 CLINICAL DATA:  Leg swelling query fluid retention. EXAM: CHEST - 2 VIEW COMPARISON:  03/01/2017 FINDINGS: The heart size and mediastinal contours are within normal limits. Both lungs are clear. The visualized skeletal structures are unremarkable. IMPRESSION: No active cardiopulmonary disease. Electronically Signed   By: Ashley Royalty M.D.   On: 03/23/2017 23:49    Procedures Procedures (including critical care time)  Medications Ordered in ED Medications - No data to display    Final Clinical Impressions(s) / ED Diagnoses   The patient is not in rhabdomyolysis.  Kidney function continues to improves.  There is no CHF.  Also negative Homan's sign and no signs of infection or compartment syndrome.  There are no indications for admission at this time. Given kidney issues I do not believe diuretics are prudent.  Recommend Ice, elevation, and compression stockings and close follow up with his PMD.    Return for weakness, numbness, changes in vision or speech, fevers >100.4 unrelieved by medication, shortness of breath, intractable vomiting, or diarrhea, abdominal pain, Inability to tolerate liquids or food, cough, altered mental status or any concerns. No signs of systemic illness or infection. The patient is nontoxic-appearing on exam and vital signs are within normal limits.   I have reviewed the triage vital signs and the nursing notes. Pertinent labs &imaging results that were available during my care of the patient were reviewed by me and  considered in my medical decision making (see chart for details).  After history, exam, and medical workup I feel the patient has been appropriately medically screened and is safe for discharge home. Pertinent diagnoses were discussed with the patient. Patient was given return precautions.    Lyndle Pang, MD 03/24/17 7622

## 2017-03-24 NOTE — BH Assessment (Signed)
Spoke to Dylan Highland, RN who said Pt does not need TTS and already has a bed at [facility] when medically cleared.   Orpah Greek Anson Fret, LPC, [facility], [facility] Triage Specialist [phone]

## 2017-03-28 NOTE — Discharge Summary (Signed)
Physician Discharge Summary  Dylan [name] MVE:[id number] DOB: 04-25-1965 DOA: 02/19/2017  PCP: Carlyle Basques, MD  Admit date: 02/19/2017 Discharge date: 03/14/2017  Time spent: 35 minutes  Recommendations for Outpatient Follow-up:  1. [facility], please check CBC at FU 2. [facility] Dr.Snider 3/12   Discharge Diagnoses:  Principal Problem:   AKI (acute kidney injury) ([place]) Active Problems:   HIV disease ([place])   Schizoaffective disorder, depressive type ([place])   Cocaine abuse ([place])   Rhabdomyolysis   Suicidal overdose ([place])   Acute hepatitis   Schizoaffective disorder, bipolar type ([place])   Acute renal failure (ARF) ([place])   Suicidal ideations   Chronic 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 coma ([place])   Discharge Condition: stable  Diet recommendation: low sodium  Filed Weights   03/11/17 2140 03/12/17 0439 03/13/17 0500  Weight: 94.6 kg (208 lb 9.6 oz) 94.6 kg (208 lb 8.9 oz) 94.4 kg (208 lb 1.8 oz)    History of present illness:  Dylan [name] is a 52 year old male with history of HIV, schizophrenia who presented with a 3-week history of suicidal and homicidal ideation. He took a bunch of pills 2 days prior to admission and had been doing cocaine. He felt like he wanted to kill himself and other people. He came to the ER due to significantly decreased urine output. In the ED, he was found to have rhabdomyolysis with acute renal failure requiring HD which was started on 2/15  Hospital Course:  Acute renal failure requiring HD -Secondary to rhabdomyolysis/pigment associated nephropathy  -required temporary Hemodialysis, last HD on 2/27  -CK above 50,000--> 25,000--> 12,000--> 18,000--> 2,000--> 1,141 -Renal US; negative for hydronephrosis -Nephrology followed pt through admission  -Last HD 2/27 and now Cr improving  -Surgical procedure for permanent HD access canceled -discharged at creatinine of 3.8 which is trending down nicely daily,  made FU with PCP for labs in 1 week  Healthcare associated pneumonia -Completed IV Vanc + IV Aztreonam -stable and improved now  Suicidal ideation/ Schizoaffective disorder -Started on zyprexa.  -Psych consulted and patient is cleared from psych stand point  -Stable   HIV/Chronic hep B -Now on tenofovir DF weekly every Tuesday, dolutegravir daily, zidovudine daily, stopped lamivudine  -requested ID office for quick FU with Dr.Snider, missed last appt  HTN -Continue amlodipine  -BP stable now   Polysubstance abuse -Cocaine use, counseled against use  Constipation -Ordered bowel regimen prn       Discharge Exam: Vitals:   03/13/17 2122 03/14/17 0538  BP: 130/82 128/78  Pulse: 99 98  Resp: 18 18  Temp: 98.7 F (37.1 C) 98.4 F (36.9 C)  SpO2: 100% 100%    General: AAOx3 Cardiovascular: S1S2/RRR Respiratory: CTAB  Discharge Instructions   Discharge Instructions    Diet - low sodium heart healthy   Complete by:  As directed    Increase activity slowly   Complete by:  As directed      Allergies as of 03/14/2017      Reactions   Penicillins Other (See Comments)   Childhood allergy   Latex Rash   Tape Rash      Medication List    STOP taking these medications   benztropine 1 MG tablet Commonly known as:  COGENTIN   citalopram 40 MG tablet Commonly known as:  CELEXA     TAKE these medications   albuterol 108 (90 Base) MCG/ACT inhaler Commonly known as:  PROVENTIL HFA;VENTOLIN HFA  Inhale 2 puffs into the lungs every 6 (six) hours as needed for wheezing or shortness of breath.   amLODipine 10 MG tablet Commonly known as:  NORVASC Take 1 tablet (10 mg total) by mouth at bedtime.   BIKTARVY 50-200-25 MG Tabs tablet Generic drug:  bictegravir-emtricitabine-tenofovir AF Take 1 tablet by mouth daily.   OLANZapine 10 MG tablet Commonly known as:  ZYPREXA Take 1 tablet (10 mg total) by mouth at bedtime.   polyethylene glycol  packet Commonly known as:  MIRALAX / GLYCOLAX Take 17 g by mouth daily as needed.   traZODone 100 MG tablet Commonly known as:  DESYREL Take 2 tablets (200 mg total) by mouth at bedtime. For insomnia.      Allergies  Allergen Reactions  . Penicillins Other (See Comments)    Childhood allergy  . Latex Rash  . Tape Rash   Follow-up Information    Schedule an appointment as soon as possible for a visit with Carlyle Basques, MD.   Specialty:  Infectious Diseases Why:  Appointment date 03/20/2017 3:15p Contact information: [address] [name] [address] [phone]        [facility] Follow up on 03/23/2017.   Why:   PCP at 9 am Contact information: [place] [address]           The results of significant diagnostics from this hospitalization (including imaging, microbiology, ancillary and laboratory) are listed below for reference.    Significant Diagnostic Studies: Dg Chest 2 View  Result Date: 03/23/2017 CLINICAL DATA:  Leg swelling query fluid retention. EXAM: CHEST - 2 VIEW COMPARISON:  03/01/2017 FINDINGS: The heart size and mediastinal contours are within normal limits. Both lungs are clear. The visualized skeletal structures are unremarkable. IMPRESSION: No active cardiopulmonary disease. Electronically Signed   By: Ashley Royalty M.D.   On: 03/23/2017 23:49   Dg Chest Port 1 View  Result Date: 03/01/2017 CLINICAL DATA:  52 year old male with renal failure, shortness of breath, HIV. EXAM: PORTABLE CHEST 1 VIEW COMPARISON:  02/26/2017 and earlier. FINDINGS: Portable AP semi upright view at 0938 hrs. Stable right IJ approach dialysis type catheter. Improved lung volumes since 02/26/2017. Regressed pulmonary vascularity. Improved bibasilar ventilation. No pneumothorax, pulmonary edema, pleural effusion or confluent pulmonary opacity identified. Stable borderline to mild cardiomegaly. Other mediastinal  contours remain normal. Visualized tracheal air column is within normal limits. Paucity bowel gas in the upper abdomen. IMPRESSION: 1. Resolved interstitial edema and lung base hypoventilation since 02/26/2017. 2.  No acute cardiopulmonary abnormality. Electronically Signed   By: Genevie Ann M.D.   On: 03/01/2017 10:06    Microbiology: No results found for this or any previous visit (from the past 240 hour(s)).   Labs: Basic Metabolic Panel: Recent Labs  Lab 03/24/17 0023 03/24/17 0032  NA 140 142  K 3.7 3.7  CL 108 106  CO2 23  --   GLUCOSE 104* 102*  BUN 26* 24*  CREATININE 1.91* 1.80*  CALCIUM 9.1  --    Liver Function Tests: Recent Labs  Lab 03/24/17 0023  AST 25  ALT 33  ALKPHOS 116  BILITOT 0.2*  PROT 7.6  ALBUMIN 3.5   No results for input(s): LIPASE, AMYLASE in the last 168 hours. No results for input(s): AMMONIA in the last 168 hours. CBC: Recent Labs  Lab 03/24/17 0023 03/24/17 0032  WBC 6.0  --   NEUTROABS 2.6  --   HGB  9.0* 9.5*  HCT 26.7* 28.0*  MCV 90.8  --   PLT 242  --    Cardiac Enzymes: Recent Labs  Lab 03/24/17 0023  CKTOTAL 456*   BNP: BNP (last 3 results) No results for input(s): BNP in the last 8760 hours.  ProBNP (last 3 results) No results for input(s): PROBNP in the last 8760 hours.  CBG: No results for input(s): GLUCAP in the last 168 hours.     Signed:  Domenic Polite MD.  [facility] 03/28/2017, 4:07 PM

## 2017-03-29 ENCOUNTER — Encounter: Payer: Self-pay | Admitting: Internal Medicine

## 2017-03-30 ENCOUNTER — Other Ambulatory Visit: Payer: Self-pay | Admitting: *Deleted

## 2017-03-30 DIAGNOSIS — B20 Human immunodeficiency virus [HIV] disease: Secondary | ICD-10-CM

## 2017-03-30 MED ORDER — BICTEGRAVIR-EMTRICITAB-TENOFOV 50-200-25 MG PO TABS: 1.0000 | ORAL_TABLET | Freq: Every day | ORAL | 5 refills | Status: DC

## 2017-03-30 NOTE — Telephone Encounter (Signed)
Yes. Keeping on biktarvy.

## 2017-03-30 NOTE — Telephone Encounter (Signed)
Medication sent to pharmacy  

## 2017-04-10 ENCOUNTER — Ambulatory Visit (INDEPENDENT_AMBULATORY_CARE_PROVIDER_SITE_OTHER): Payer: Self-pay | Admitting: Internal Medicine

## 2017-04-10 ENCOUNTER — Encounter: Payer: Self-pay | Admitting: Internal Medicine

## 2017-04-10 VITALS — BP 138/86 | HR 102 | Temp 97.8°F | Ht 74.0 in | Wt 225.0 lb

## 2017-04-10 DIAGNOSIS — N179 Acute kidney failure, unspecified: Secondary | ICD-10-CM

## 2017-04-10 DIAGNOSIS — R600 Localized edema: Secondary | ICD-10-CM

## 2017-04-10 DIAGNOSIS — F251 Schizoaffective disorder, depressive type: Secondary | ICD-10-CM

## 2017-04-10 DIAGNOSIS — B20 Human immunodeficiency virus [HIV] disease: Secondary | ICD-10-CM

## 2017-04-10 LAB — COMPLETE METABOLIC PANEL WITH GFR
AG RATIO: 1.2 (calc) (ref 1.0–2.5)
ALKALINE PHOSPHATASE (APISO): 95 U/L (ref 40–115)
ALT: 21 U/L (ref 9–46)
AST: 22 U/L (ref 10–35)
Albumin: 3.9 g/dL (ref 3.6–5.1)
BUN: 13 mg/dL (ref 7–25)
CHLORIDE: 105 mmol/L (ref 98–110)
CO2: 26 mmol/L (ref 20–32)
Calcium: 9.4 mg/dL (ref 8.6–10.3)
Creat: 1.32 mg/dL (ref 0.70–1.33)
GFR, Est African American: 72 mL/min/{1.73_m2} (ref >=60)
GFR, Est Non African American: 62 mL/min/{1.73_m2} (ref >=60)
GLOBULIN: 3.2 g/dL (ref 1.9–3.7)
Glucose, Bld: 98 mg/dL (ref 65–99)
POTASSIUM: 4.2 mmol/L (ref 3.5–5.3)
SODIUM: 139 mmol/L (ref 135–146)
Total Bilirubin: 0.3 mg/dL (ref 0.2–1.2)
Total Protein: 7.1 g/dL (ref 6.1–8.1)

## 2017-04-10 NOTE — Progress Notes (Signed)
RFV: follow up for hiv disease  Patient ID: Dylan [name], male   DOB: 1965/10/16, 52 y.o.   MRN: [id number]  HPI Dylan [name] is a 52yo M with HIV disease, HTN, bipolar-schizoaffective disorder and recent SI hospitalization associated with rhabdo plus AKI with temp HD needs. He has been out of the hospital for nearly a month, but has had a ED visit for leg pain and swelling in mid March. He states that he notices that he has frequent urination lately, but denies any dysuria. He still has some lower extremity swelling  When he was incarcerated he had [place] of liver, found to have fatty liver disease, but he is known to have hep B S Ag +, hep B Core Ab+. Hep B VL negative (FEb 2019)  He remains homeless, has ankle monitor.  Outpatient Encounter Medications as of 04/10/2017  Medication Sig  . albuterol (PROVENTIL HFA;VENTOLIN HFA) 108 (90 BASE) MCG/ACT inhaler Inhale 2 puffs into the lungs every 6 (six) hours as needed for wheezing or shortness of breath.  Marland Kitchen amLODipine (NORVASC) 10 MG tablet Take 1 tablet (10 mg total) by mouth at bedtime.  . bictegravir-emtricitabine-tenofovir AF (BIKTARVY) 50-200-25 MG TABS tablet Take 1 tablet by mouth daily.  Marland Kitchen OLANZapine (ZYPREXA) 10 MG tablet Take 1 tablet (10 mg total) by mouth at bedtime.  . polyethylene glycol (MIRALAX / GLYCOLAX) packet Take 17 g by mouth daily as needed.  . traZODone (DESYREL) 100 MG tablet Take 2 tablets (200 mg total) by mouth at bedtime. For insomnia. (Patient not taking: Reported on 03/20/2017)   No facility-administered encounter medications on file as of 04/10/2017.      Patient Active Problem List   Diagnosis Date Noted  . Schizoaffective disorder, bipolar type ([place])   . Acute renal failure (ARF) ([place])   . Suicidal ideations   . 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 AKI (acute kidney injury) ([place]) 02/19/2017  . Rhabdomyolysis 02/19/2017  . Suicidal overdose ([place]) 02/19/2017  . Acute hepatitis  02/19/2017  . Elevated LFTs   . Homicidal ideations   . Schizoaffective disorder, depressive type ([place]) 09/05/2011  . Cocaine abuse ([place]) 09/05/2011  . Degenerative disc disease 08/21/2011  . HIV disease ([place]) 08/17/2011     Health Maintenance Due  Topic Date Due  . TETANUS/TDAP  08/01/1984  . COLONOSCOPY  08/02/2015    Social History   Tobacco Use  . Smoking status: Current Every Day Smoker    Packs/day: 1.00    Years: 15.00    Pack years: 15.00    Types: Cigarettes  . Smokeless tobacco: Current User  Substance Use Topics  . Alcohol use: No    Alcohol/week: 0.0 oz    Comment: past  . Drug use: Yes    Types: Cocaine    Comment: past    Review of Systems Per hpi otherwise 12 point ros is negative Physical Exam  BP 138/86   Pulse (!) 102   Temp 97.8 F (36.6 C) (Oral)   Ht 6\' 2"  (1.88 m)   Wt 225 lb (102.1 kg)   BMI 28.89 kg/m  Physical Exam  Constitutional: He is oriented to person, place, and time. He appears well-developed and well-nourished. No distress.  HENT:  Mouth/Throat: Oropharynx is clear and moist. No oropharyngeal exudate.  Cardiovascular: Normal rate, regular rhythm and normal heart sounds. Exam reveals no gallop and no friction rub.  No murmur heard.  Pulmonary/Chest: Effort normal and breath  sounds normal. No respiratory distress. He has no wheezes.  Ext: +1 edema Neurological: He is alert and oriented to person, place, and time.  Skin: Skin is warm and dry. No rash noted. No erythema.  Psychiatric: He has a normal mood and affect. His behavior is normal.    Lab Results  Component Value Date   CD4TCELL 24 (L) 02/08/2012   Lab Results  Component Value Date   CD4TABS 600 02/08/2012   CD4TABS 500 11/09/2011   CD4TABS 560 08/18/2011   Lab Results  Component Value Date   HIV1RNAQUANT <20 02/21/2017   Lab Results  Component Value Date   HEPBSAB NEG 08/18/2011   Lab Results  Component Value Date   LABRPR NON REAC 08/18/2011     CBC Lab Results  Component Value Date   WBC 6.0 03/24/2017   RBC 2.94 (L) 03/24/2017   HGB 9.5 (L) 03/24/2017   HCT 28.0 (L) 03/24/2017   PLT 242 03/24/2017   MCV 90.8 03/24/2017   MCH 30.6 03/24/2017   MCHC 33.7 03/24/2017   RDW 13.6 03/24/2017   LYMPHSABS 2.5 03/24/2017   MONOABS 0.7 03/24/2017   EOSABS 0.2 03/24/2017    BMET Lab Results  Component Value Date   NA 142 03/24/2017   K 3.7 03/24/2017   CL 106 03/24/2017   CO2 23 03/24/2017   GLUCOSE 102 (H) 03/24/2017   BUN 24 (H) 03/24/2017   CREATININE 1.80 (H) 03/24/2017   CALCIUM 9.1 03/24/2017   GFRNONAA 39 (L) 03/24/2017   GFRAA 45 (L) 03/24/2017      Assessment and Plan  HIV disease = well-controlled in February. Needs bus pass to get to pharmacy to pick up his medications. Continue with excellent adherence  AKI 2/2 rhabdo/ATN = steadily improving, wondering if he is having post-ATN diuresis type phenomenon vs incontinence. Will repeat his BMP. Does not appear to have urgency or dysuria.  will have him do ua and urine culture  Chronic hep B = will check hepatic function. He is hep A immune.  LE edema = likely still having element of fluid retention from previous hospitalization and kidney dysfunction. May need to do 4-5 days of diuresis. Await to see results of bmp.Recommend to get him compression stockings. He is size 13 foot  Bipolar-schizoaffective disorder = continue to seek care at [facility]

## 2017-05-16 ENCOUNTER — Ambulatory Visit: Payer: Self-pay | Admitting: Internal Medicine

## 2017-05-17 ENCOUNTER — Other Ambulatory Visit: Payer: Self-pay

## 2017-05-17 ENCOUNTER — Emergency Department (HOSPITAL_COMMUNITY)
Admission: EM | Admit: 2017-05-17 | Discharge: 2017-05-19 | Disposition: A | Discharge status: Home / Self Care | Payer: Self-pay | Attending: Emergency Medicine | Admitting: Emergency Medicine

## 2017-05-17 ENCOUNTER — Encounter (HOSPITAL_COMMUNITY): Payer: Self-pay | Admitting: Emergency Medicine

## 2017-05-17 DIAGNOSIS — F14159 Cocaine abuse with cocaine-induced psychotic disorder, unspecified: Secondary | ICD-10-CM | POA: Diagnosis present

## 2017-05-17 DIAGNOSIS — F1910 Other psychoactive substance abuse, uncomplicated: Secondary | ICD-10-CM | POA: Insufficient documentation

## 2017-05-17 DIAGNOSIS — Z79899 Other long term (current) drug therapy: Secondary | ICD-10-CM | POA: Insufficient documentation

## 2017-05-17 DIAGNOSIS — F250 Schizoaffective disorder, bipolar type: Secondary | ICD-10-CM | POA: Insufficient documentation

## 2017-05-17 DIAGNOSIS — B20 Human immunodeficiency virus [HIV] disease: Secondary | ICD-10-CM | POA: Insufficient documentation

## 2017-05-17 DIAGNOSIS — R440 Auditory hallucinations: Secondary | ICD-10-CM

## 2017-05-17 DIAGNOSIS — N189 Chronic kidney disease, unspecified: Secondary | ICD-10-CM | POA: Insufficient documentation

## 2017-05-17 DIAGNOSIS — F1414 Cocaine abuse with cocaine-induced mood disorder: Secondary | ICD-10-CM | POA: Diagnosis present

## 2017-05-17 DIAGNOSIS — R45851 Suicidal ideations: Secondary | ICD-10-CM | POA: Insufficient documentation

## 2017-05-17 DIAGNOSIS — F17210 Nicotine dependence, cigarettes, uncomplicated: Secondary | ICD-10-CM | POA: Insufficient documentation

## 2017-05-17 LAB — COMPREHENSIVE METABOLIC PANEL
ALT: 31 U/L (ref 17–63)
AST: 34 U/L (ref 15–41)
Albumin: 3.9 g/dL (ref 3.5–5.0)
Alkaline Phosphatase: 87 U/L (ref 38–126)
Anion gap: 8 (ref 5–15)
BUN: 21 mg/dL — AB (ref 6–20)
CHLORIDE: 107 mmol/L (ref 101–111)
CO2: 23 mmol/L (ref 22–32)
CREATININE: 1.27 mg/dL — AB (ref 0.61–1.24)
Calcium: 9.3 mg/dL (ref 8.9–10.3)
GFR calc Af Amer: >60 mL/min (ref >60)
GFR calc non Af Amer: >60 mL/min (ref >60)
Glucose, Bld: 98 mg/dL (ref 65–99)
Potassium: 3.7 mmol/L (ref 3.5–5.1)
SODIUM: 138 mmol/L (ref 135–145)
Total Bilirubin: 0.4 mg/dL (ref 0.3–1.2)
Total Protein: 7.7 g/dL (ref 6.5–8.1)

## 2017-05-17 LAB — CBC
HCT: 36.9 % — ABNORMAL LOW (ref 39.0–52.0)
HEMOGLOBIN: 12.3 g/dL — AB (ref 13.0–17.0)
MCH: 31.5 pg (ref 26.0–34.0)
MCHC: 33.3 g/dL (ref 30.0–36.0)
MCV: 94.6 fL (ref 78.0–100.0)
Platelets: 226 10*3/uL (ref 150–400)
RBC: 3.9 MIL/uL — AB (ref 4.22–5.81)
RDW: 14.7 % (ref 11.5–15.5)
WBC: 6.4 10*3/uL (ref 4.0–10.5)

## 2017-05-17 LAB — RAPID URINE DRUG SCREEN, HOSP PERFORMED
Amphetamines: NOT DETECTED
Barbiturates: NOT DETECTED
Benzodiazepines: NOT DETECTED
Cocaine: POSITIVE — AB
Opiates: NOT DETECTED
TETRAHYDROCANNABINOL: NOT DETECTED

## 2017-05-17 LAB — ACETAMINOPHEN LEVEL: Acetaminophen (Tylenol), Serum: <10 ug/mL — ABNORMAL LOW (ref 10–30)

## 2017-05-17 LAB — SALICYLATE LEVEL: Salicylate Lvl: <7 mg/dL (ref 2.8–30.0)

## 2017-05-17 LAB — ETHANOL: Alcohol, Ethyl (B): <10 mg/dL (ref <10)

## 2017-05-17 MED ORDER — TRAZODONE HCL 100 MG PO TABS
200.0000 mg | ORAL_TABLET | Freq: Every day | ORAL | Status: DC
  Administered 2017-05-17 – 2017-05-18 (×2): 200 mg via ORAL
  Filled 2017-05-17 (×2): qty 2

## 2017-05-17 MED ORDER — OLANZAPINE 10 MG PO TABS
10.0000 mg | ORAL_TABLET | Freq: Every day | ORAL | Status: DC
  Administered 2017-05-17: 10 mg via ORAL
  Filled 2017-05-17: qty 1

## 2017-05-17 MED ORDER — ALBUTEROL SULFATE HFA 108 (90 BASE) MCG/ACT IN AERS
2.0000 | INHALATION_SPRAY | Freq: Four times a day (QID) | RESPIRATORY_TRACT | Status: DC | PRN
Start: 2017-05-17 — End: 2017-05-19

## 2017-05-17 MED ORDER — AMLODIPINE BESYLATE 5 MG PO TABS
10.0000 mg | ORAL_TABLET | Freq: Every day | ORAL | Status: DC
  Administered 2017-05-17 – 2017-05-18 (×2): 10 mg via ORAL
  Filled 2017-05-17 (×2): qty 2

## 2017-05-17 MED ORDER — BICTEGRAVIR-EMTRICITAB-TENOFOV 50-200-25 MG PO TABS
1.0000 | ORAL_TABLET | Freq: Every day | ORAL | Status: DC
  Administered 2017-05-18 – 2017-05-19 (×2): 1 via ORAL
  Filled 2017-05-17 (×2): qty 1

## 2017-05-17 NOTE — ED Triage Notes (Signed)
Patient here from home with complaints of suicidal ideation and hallucinations. Reports that the voices are telling him to kill himself. Normally takes haldol and has not taken medication since Sunday.

## 2017-05-17 NOTE — ED Notes (Signed)
Bed: [facility] Expected date:  Expected time:  Means of arrival:  Comments: 

## 2017-05-17 NOTE — ED Provider Notes (Signed)
[facility] DEPT Provider Note   CSN: [id number] Arrival date & time: 05/17/17  1653    History   Chief Complaint Chief Complaint  Patient presents with  . Suicidal  . Hallucinations    HPI Dylan [name] is a 52 y.o. male who presents with SI and hallucinations. PMH significant for HIV disease, HTN, bipolar-schizoaffective disorder, cocaine abuse, Hep B, hx of AKI due to rhabdomyolysis in February.  He states for the past 5 days he has been off of his antipsychotic medication.  He states that someone has been putting "voodoo" into the medicine therefore he has not been taking it.  He does state that he thinks it helped him though. He reports SI stating that he has a plan to jump off a parking deck.  He has command hallucinations telling him to do that and to hurt others.  He states he is homeless and has been drinking alcohol and using cocaine to help with quieting the voices.  He called the crisis hotline earlier today and was picked up by [employer] and brought to the hospital.  He reports ongoing swelling in his feet but is much better than when he was hospitalized back in February. It gets better with elevation and worse when he's on his feet all day.  He also has chronic sores on his feet due to the swelling.  He is compliant with his HIV medication.  He denies headache, chest pain, shortness of breath, abdominal pain.  He follows [facility] for his psychiatric needs.  HPI  Past Medical History:  Diagnosis Date  . Arthritis   . Bipolar 1 disorder ([place])   . Depression   . Hepatitis B   . Human immunodeficiency virus (HIV) ([place])   . Schizophrenia [facility])     Patient Active Problem List   Diagnosis Date Noted  . Schizoaffective disorder, bipolar type ([place])   . Acute renal failure (ARF) ([place])   . Suicidal ideations   . Chronic 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 coma ([place])   . AKI (acute kidney injury) ([place]) 02/19/2017  . Rhabdomyolysis  02/19/2017  . Suicidal overdose ([place]) 02/19/2017  . Acute hepatitis 02/19/2017  . Elevated LFTs   . Homicidal ideations   . Schizoaffective disorder, depressive type ([place]) 09/05/2011  . Cocaine abuse ([place]) 09/05/2011  . Degenerative disc disease 08/21/2011  . HIV disease ([place]) 08/17/2011    Past Surgical History:  Procedure Laterality Date  . IR FLUORO GUIDE CV LINE RIGHT  02/22/2017  . IR US GUIDE VASC ACCESS RIGHT  02/22/2017  . TUMOR REMOVAL     From Chest        Home Medications    Prior to Admission medications   Medication Sig Start Date End Date Taking? Authorizing Provider  albuterol (PROVENTIL HFA;VENTOLIN HFA) 108 (90 BASE) MCG/ACT inhaler Inhale 2 puffs into the lungs every 6 (six) hours as needed for wheezing or shortness of breath. 09/14/11 09/13/12  Readling, Milana Huntsman, MD  amLODipine (NORVASC) 10 MG tablet Take 1 tablet (10 mg total) by mouth at bedtime. 03/14/17   Domenic Polite, MD  bictegravir-emtricitabine-tenofovir AF (BIKTARVY) 50-200-25 MG TABS tablet Take 1 tablet by mouth daily. 03/30/17   Carlyle Basques, MD  OLANZapine (ZYPREXA) 10 MG tablet Take 1 tablet (10 mg total) by mouth at bedtime. Patient not taking: Reported on 04/10/2017 03/14/17   Domenic Polite, MD  polyethylene glycol [facility] Floria Raveling) packet Take 17 g by mouth daily as needed.  03/14/17   Domenic Polite, MD  traZODone (DESYREL) 100 MG tablet Take 2 tablets (200 mg total) by mouth at bedtime. For insomnia. Patient not taking: Reported on 03/20/2017 04/19/12   Pamelia Hoit    Family History No family history on file.  Social History Social History   Tobacco Use  . Smoking status: Current Every Day Smoker    Packs/day: 1.00    Years: 15.00    Pack years: 15.00    Types: Cigarettes  . Smokeless tobacco: Current User  Substance Use Topics  . Alcohol use: No    Alcohol/week: 0.0 oz    Comment: past  . Drug use: Yes    Types: Cocaine    Comment: past     Allergies     Penicillins; Latex; and Tape   Review of Systems Review of Systems  Constitutional: Negative for fever.  Respiratory: Negative for shortness of breath.   Cardiovascular: Positive for leg swelling. Negative for chest pain.  Gastrointestinal: Negative for abdominal pain.  Genitourinary: Negative for dysuria.  Musculoskeletal: Negative for gait problem.  Neurological: Negative for headaches.  Psychiatric/Behavioral: Positive for dysphoric mood, hallucinations and suicidal ideas. Negative for self-injury.  All other systems reviewed and are negative.    Physical Exam Updated Vital Signs BP 132/76 (BP Location: Left Arm)   Pulse 89   Temp 98.5 F (36.9 C) (Oral)   Resp 19   SpO2 99%   Physical Exam  Constitutional: He is oriented to person, place, and time. He appears well-developed and well-nourished. No distress.  HENT:  Head: Normocephalic and atraumatic.  Eyes: Pupils are equal, round, and reactive to light. Conjunctivae are normal. Right eye exhibits no discharge. Left eye exhibits no discharge. No scleral icterus.  Neck: Normal range of motion.  Cardiovascular: Normal rate and regular rhythm.  Pulmonary/Chest: Effort normal and breath sounds normal. No respiratory distress.  Abdominal: Soft. Bowel sounds are normal. He exhibits no distension. There is no tenderness.  Musculoskeletal:  Trace peripheral edema.  Multiple calluses on his feet  Neurological: He is alert and oriented to person, place, and time.  Skin: Skin is warm and dry.  Psychiatric: He has a normal mood and affect. His speech is normal and behavior is normal. He expresses suicidal ideation. He expresses suicidal plans.  Nursing note and vitals reviewed.    ED Treatments / Results  Labs (all labs ordered are listed, but only abnormal results are displayed) Labs Reviewed  COMPREHENSIVE METABOLIC PANEL - Abnormal; Notable for the following components:      Result Value   BUN 21 (*)    Creatinine, Ser  1.27 (*)    All other components within normal limits  ACETAMINOPHEN LEVEL - Abnormal; Notable for the following components:   Acetaminophen (Tylenol), Serum <10 (*)    All other components within normal limits  CBC - Abnormal; Notable for the following components:   RBC 3.90 (*)    Hemoglobin 12.3 (*)    HCT 36.9 (*)    All other components within normal limits  ETHANOL  SALICYLATE LEVEL  RAPID URINE DRUG SCREEN, HOSP PERFORMED    EKG None  Radiology No results found.  Procedures Procedures (including critical care time)  Medications Ordered in ED Medications - No data to display   Initial Impression / Assessment and Plan / ED Course  I have reviewed the triage vital signs and the nursing notes.  Pertinent labs & imaging results that were available during my care  of the patient were reviewed by me and considered in my medical decision making (see chart for details).  52 year old male presents with SI and command auditory hallucinations. He reports being off his meds. Vitals are normal. His exam is unremarkable. CBC is remarkable for mild anemia. CMp is remarkable for elevated SCr. This is improved from his last value (was 1.32 on 4/2). Tylenol and ASA levels are normal. ETOH level is normal. UDS is pending however pt admits to drug use. Pt medically cleared for TTS.  Final Clinical Impressions(s) / ED Diagnoses   Final diagnoses:  Chronic kidney disease, unspecified CKD stage  Polysubstance abuse [facility])  Auditory hallucinations  Suicidal ideations    ED Discharge Orders    None       Recardo Evangelist, PA-C 05/17/17 1950    Duffy Bruce, MD 05/20/17 1253

## 2017-05-17 NOTE — BH Assessment (Addendum)
Assessment Note  Dylan [name] is an 52 y.o. male, who presents voluntary and unaccompanied to [facility]. Clinician asked the pt, "what brought you to the hospital?" Pt reported, he has been suicidal for three days. Pt reported, hearing voices telling him to hit someone in the head and kill himself. Pt reported, having a plan to jump off a parking deck. Pt reported, he has been hearing voices for years.  Pt reported, he can get weapons. Pt denies, self-injurious behaviors.   Pt denies abuse. Pt reported, taking a few sniffs of cocaine, 3-4 days ago and drinking a beer, today, to calm the voices. Pt reported, it helped a little. Pt reported, being linked to [facility] for medication management and counseling. Pt reported, he has been off is medications since Sunday (05/13/2017). Pt reported, he is prescribed Haldol and can not remember the other medication. Pt reported, having a medication appointment on 05/21/2017. Pt has previous inpatient admissions at [facility], [facility] and [facility] for hallucinations and suicidal thoughts.    Pt presents alert in scrubs with logical/coherent speech. Pt's eye contact was fair. Pt's mood was sad/helpless. Pt's affect was flat. Pt's thought process was coherent/relevant. Pt's judgement was partial. Pt was oriented x4. Pt's concentration was normal. Pt's insight and impulse control are fair. Pt reported, if discharged from [facility] he could not contract for safety. Pt reported, if inpatient treatment is recommended he would sign-in voluntarily.   Diagnosis: F33.3 Major Depressive Disorder, recurrent, with psychotic features.  Past Medical History:  Past Medical History:  Diagnosis Date  . Arthritis   . Bipolar 1 disorder ([place])   . Depression   . Hepatitis B   . Human immunodeficiency virus (HIV) ([place])   . Schizophrenia [facility])     Past Surgical History:  Procedure Laterality Date  . IR FLUORO GUIDE CV LINE RIGHT  02/22/2017  . IR US GUIDE VASC ACCESS RIGHT   02/22/2017  . TUMOR REMOVAL     From Chest    Family History: No family history on file.  Social History:  reports that he has been smoking cigarettes.  He has a 15.00 pack-year smoking history. He uses smokeless tobacco. He reports that he has current or past drug history. Drug: Cocaine. He reports that he does not drink alcohol.  Additional Social History:  Alcohol / Drug Use Pain Medications: See MAR Prescriptions: See MAR Over the Counter: See MAR History of alcohol / drug use?: Yes(Pt's UDS is pending. ) Substance #1 Name of Substance 1: Cocaine.  1 - Age of First Use: UTA 1 - Amount (size/oz): Pt reported, taking a few sniffs, 3-4 days ago.  1 - Frequency: UTA 1 - Duration: UTA 1 - Last Use / Amount: Pt reporte, 3-4 days ago.  Substance #2 Name of Substance 2: Alcohol.  2 - Age of First Use: UTA 2 - Amount (size/oz): Pt reported, drinking a beer, today.  2 - Frequency: UTA 2 - Duration: UTA 2 - Last Use / Amount: Pt reported, today.  CIWA: CIWA-Ar BP: (!) 146/89 Pulse Rate: 78 COWS:    Allergies:  Allergies  Allergen Reactions  . Penicillins Other (See Comments)    Childhood allergy  . Latex Rash  . Tape Rash    Home Medications:  (Not in a hospital admission)  OB/GYN Status:  No LMP for male patient.  General Assessment Data [facility] Assessment: In system Is this a Tele or Face-to-Face Assessment?: Face-to-Face Is  this an Initial Assessment or a Re-assessment for this encounter?: Initial Assessment Marital status: Separated Living Arrangements: Other (Comment)(Homeless. ) Can pt return to current living arrangement?: Yes Admission Status: Voluntary Is patient capable of signing voluntary admission?: Yes Referral Source: Self/Family/Friend Insurance type: Self-pay.      Crisis Care Plan Living Arrangements: Other (Comment)(Homeless. ) Legal Guardian: Other:(Self. ) Name of Psychiatrist: [facility].  Name of Therapist:  Beverly Sessions.   Education Status Is patient currently in school?: No Is the patient employed, unemployed or receiving disability?: Unemployed(Pt reported, his disability is suppose to get reinstated. )  Risk to self with the past 6 months Suicidal Ideation: Yes-Currently Present Has patient been a risk to self within the past 6 months prior to admission? : Yes Suicidal Intent: Yes-Currently Present Has patient had any suicidal intent within the past 6 months prior to admission? : Yes Is patient at risk for suicide?: Yes Suicidal Plan?: Yes-Currently Present Has patient had any suicidal plan within the past 6 months prior to admission? : Yes Specify Current Suicidal Plan: Pt reported, jumping off a parking deck,. Access to Means: Yes Specify Access to Suicidal Means: Pt has access to a parking deck.  What has been your use of drugs/alcohol within the last 12 months?: UDS is pending. Previous Attempts/Gestures: Yes How many times?: (Pt is unsure. ) Other Self Harm Risks: Pt denies.  Triggers for Past Attempts: Unknown Intentional Self Injurious Behavior: None(Pt denies. ) Family Suicide History: No Recent stressful life event(s): Other (Comment)(Homelessness, not eating. ) Persecutory voices/beliefs?: Yes Depression: Yes Depression Symptoms: Feeling angry/irritable, Feeling worthless/self pity, Loss of interest in usual pleasures, Guilt, Fatigue, Isolating Substance abuse history and/or treatment for substance abuse?: Yes Suicide prevention information given to non-admitted patients: Not applicable  Risk to Others within the past 6 months Homicidal Ideation: No Does patient have any lifetime risk of violence toward others beyond the six months prior to admission? : No(Pt denies. ) Thoughts of Harm to Others: Yes-Currently Present Comment - Thoughts of Harm to Others: Pt reported, the voices tell him to hurt others.  Current Homicidal Intent: No Current Homicidal Plan: No Access to  Homicidal Means: No Identified Victim: "others."  History of harm to others?: No(Pt denies. ) Assessment of Violence: None Noted Violent Behavior Description: NA Does patient have access to weapons?: Yes (Comment)(Pt reported, he can get weapons. ) Criminal Charges Pending?: No Does patient have a court date: No Is patient on probation?: Yes(Pt is on parole/probation until November 2019.)  Psychosis Hallucinations: Auditory Delusions: None noted  Mental Status Report Appearance/Hygiene: In scrubs Eye Contact: Fair Motor Activity: Unremarkable Speech: Logical/coherent Level of Consciousness: Alert Mood: Sad, Helpless Affect: Flat Anxiety Level: None Thought Processes: Coherent, Relevant Judgement: Partial Orientation: Person, Place, Time, Situation Obsessive Compulsive Thoughts/Behaviors: None  Cognitive Functioning Concentration: Normal Memory: Recent Intact Is patient IDD: No Is patient DD?: No Insight: Fair Impulse Control: Fair Appetite: Fair Have you had any weight changes? : Loss Amount of the weight change? (lbs): (Pt is unsure. ) Sleep: Decreased Total Hours of Sleep: (Pt reported, she hasn't slept. ) Vegetative Symptoms: None  ADLScreening [facility] Assessment Services) Patient's cognitive ability adequate to safely complete daily activities?: Yes Patient able to express need for assistance with ADLs?: Yes Independently performs ADLs?: Yes (appropriate for developmental age)  Prior Inpatient Therapy Prior Inpatient Therapy: Yes Prior Therapy Dates: UTA Prior Therapy Facilty/Provider(s): [facility], [facility], [facility], and [facility].  Reason for Treatment: Halluncinations and SI.  Prior  Outpatient Therapy Prior Outpatient Therapy: Yes Prior Therapy Dates: Current Prior Therapy Facilty/Provider(s): [facility]. Reason for Treatment: Medication management and counseling. Does patient have an ACCT team?: No Does patient have Intensive In-House  Services?  : No Does patient have [facility] services? : Yes Does patient have P4CC services?: No  ADL Screening (condition at time of admission) Patient's cognitive ability adequate to safely complete daily activities?: Yes Is the patient deaf or have difficulty hearing?: No Does the patient have difficulty seeing, even when wearing glasses/contacts?: Yes(Pt wears glasses. ) Does the patient have difficulty concentrating, remembering, or making decisions?: Yes Patient able to express need for assistance with ADLs?: Yes Does the patient have difficulty dressing or bathing?: No Independently performs ADLs?: Yes (appropriate for developmental age) Does the patient have difficulty walking or climbing stairs?: No Weakness of Legs: Both(Pt reported, having bad knees.) Weakness of Arms/Hands: None  Home Assistive Devices/Equipment Home Assistive Devices/Equipment: Eyeglasses    Abuse/Neglect Assessment (Assessment to be complete while patient is alone) Abuse/Neglect Assessment Can Be Completed: Yes Physical Abuse: Denies(Pt denies. ) Verbal Abuse: Denies(Pt denies. ) Sexual Abuse: Denies(Pt denies. ) Exploitation of patient/patient's resources: Denies(Pt denies. ) Self-Neglect: Denies(Pt denies. )     Advance Directives (For Healthcare) Does Patient Have a Medical Advance Directive?: No Would patient like information on creating a medical advance directive?: No - Patient declined    Additional Information 1:1 In Past 12 Months?: No CIRT Risk: No Elopement Risk: No Does patient have medical clearance?: Yes     Disposition: [place], NP recommends inpatient treatment. Disposition discussed with Claiborne Billings, [place] and Darryll Capers, RN. Per Herbert Spires, [facility] no appropriate beds available.   Disposition Initial Assessment Completed for this Encounter: Yes Disposition of Patient: (inpatient treatment.) Patient refused recommended treatment: No Mode of transportation if patient is discharged?: N/A  On Site  Evaluation by:  Vertell Novak, MS, LPC, CRC Reviewed with Physician: Claiborne Billings, [place] and Farris Has, NP.    Vertell Novak 05/17/2017 9:48 PM   Vertell Novak, MS, [facility], [facility] Triage Specialist [phone]

## 2017-05-17 NOTE — ED Notes (Signed)
Bed: [facility] Expected date:  Expected time:  Means of arrival:  Comments: [place]

## 2017-05-17 NOTE — ED Notes (Signed)
Pt A&O x 3, no distress noted, calm & cooperative.  Presents with SI, plan to take overdose of pills and jump off [place].  Pt admits to taking pills as SI attempt 2 years ago.  Denies HI, admits to AVH, hearing voices stating that he needs to die and seeing shadows.  Feeling hopeless.  Pt states people on the street are working [employer]ood[employer] on his medicine, so he stopped taking his Haldol x 5 days ago.  Pt reports diagnosed with Schizoaffective DO, Manic Depression and Bipolar DO.  Monitoring for safety, Q 15 min checks in effect.

## 2017-05-18 DIAGNOSIS — F1414 Cocaine abuse with cocaine-induced mood disorder: Secondary | ICD-10-CM

## 2017-05-18 DIAGNOSIS — F251 Schizoaffective disorder, depressive type: Secondary | ICD-10-CM

## 2017-05-18 DIAGNOSIS — R45851 Suicidal ideations: Secondary | ICD-10-CM

## 2017-05-18 DIAGNOSIS — G4700 Insomnia, unspecified: Secondary | ICD-10-CM

## 2017-05-18 DIAGNOSIS — F17210 Nicotine dependence, cigarettes, uncomplicated: Secondary | ICD-10-CM

## 2017-05-18 MED ORDER — FLUOXETINE HCL 10 MG PO CAPS
10.0000 mg | ORAL_CAPSULE | Freq: Every day | ORAL | Status: DC
  Administered 2017-05-18 – 2017-05-19 (×2): 10 mg via ORAL
  Filled 2017-05-18 (×2): qty 1

## 2017-05-18 MED ORDER — OLANZAPINE 10 MG PO TABS
10.0000 mg | ORAL_TABLET | Freq: Two times a day (BID) | ORAL | Status: DC
  Administered 2017-05-18 – 2017-05-19 (×2): 10 mg via ORAL
  Filled 2017-05-18 (×2): qty 1

## 2017-05-18 NOTE — ED Notes (Signed)
Pt sleeping at present, A&O x 3, no distress noted, calm & cooperative. Denies SI at present.  Monitoring for safety, Q 15 min checks in effect.

## 2017-05-18 NOTE — ED Notes (Signed)
Patient is alert and oriented x 4.  Presents with calm affect and depressed mood.  Denies suicidal thoughts, auditory and visual hallucinations.  Medications given as prescribed.  Routine safety checks maintained.  Patient remained in bed most of this shift.  Patient is safe on the unit.

## 2017-05-18 NOTE — ED Notes (Signed)
Tech Shunta at bedside to observe pt while ankle bracelet charges for 90 min.

## 2017-05-18 NOTE — Consult Note (Addendum)
[facility] Consult   Reason for Consult:  Suicidal ideations after using cocaine Referring Physician:  EDP Patient Identification: Dylan [name] MRN:  [id number] Principal Diagnosis: Cocaine abuse with cocaine-induced mood disorder [facility]) Diagnosis:   Patient Active Problem List   Diagnosis Date Noted  . Cocaine abuse with cocaine-induced mood disorder ([place]) [F14.14] 05/18/2017    Priority: High  . Schizoaffective disorder, bipolar type ([place]) [F25.0]   . Acute renal failure (ARF) ([place]) [N17.9]   . Suicidal ideations [R45.851]   . Chronic 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 coma (HCC) [B18.1]   . AKI (acute kidney injury) ([place]) [N17.9] 02/19/2017  . Rhabdomyolysis [M62.82] 02/19/2017  . Suicidal overdose ([place]) [T50.902A] 02/19/2017  . Acute hepatitis [B17.9] 02/19/2017  . Elevated LFTs [R94.5]   . Homicidal ideations [R45.850]   . Schizoaffective disorder, depressive type ([place]) [F25.1] 09/05/2011  . Cocaine abuse ([place]) [F14.10] 09/05/2011  . Degenerative disc disease [IMO0002] 08/21/2011  . HIV disease ([place]) [B20] 08/17/2011    Total Time spent with patient: 45 minutes  Subjective:   Dylan [name] is a 52 y.o. male patient's medications will be restarted and reevaluated in the am for stabilization.  HPI:  52 yo male who presented to the ED under the influence of cocaine and having suicidal ideations.  On assessment, he continues to want to sleep but said he was suicidal with some passive, intermittent suicidal ideations.  No hallucinations, homicidal ideations, or withdrawal symptoms.  He has been off his medications for the past 5-6 days due to a cocaine binge.  Dylan [name] is a patient at [facility].  His medications will be restarted and a Peer Support consult placed, discharge tomorrow if stable.  Past Psychiatric History: schizoaffective disorder, substance abuse  Risk to Self: None Risk to Others: None Prior Inpatient Therapy: Prior Inpatient  Therapy: Yes Prior Therapy Dates: UTA Prior Therapy Facilty/Provider(s): [facility], [place], [facility], and [facility].  Reason for Treatment: Halluncinations and SI. Prior Outpatient Therapy: Prior Outpatient Therapy: Yes Prior Therapy Dates: Current Prior Therapy Facilty/Provider(s): [facility]. Reason for Treatment: Medication management and counseling. Does patient have an ACCT team?: No Does patient have Intensive In-House Services?  : No Does patient have [facility] services? : Yes Does patient have P4CC services?: No  Past Medical History:  Past Medical History:  Diagnosis Date  . Arthritis   . Bipolar 1 disorder ([place])   . Depression   . Hepatitis B   . Human immunodeficiency virus (HIV) ([place])   . Schizophrenia [facility])     Past Surgical History:  Procedure Laterality Date  . IR FLUORO GUIDE CV LINE RIGHT  02/22/2017  . IR US GUIDE VASC ACCESS RIGHT  02/22/2017  . TUMOR REMOVAL     From Chest   Family History: No family history on file. Family Psychiatric  History: none Social History:  Social History   Substance and Sexual Activity  Alcohol Use No  . Alcohol/week: 0.0 oz   Comment: past     Social History   Substance and Sexual Activity  Drug Use Yes  . Types: Cocaine   Comment: past    Social History   Socioeconomic History  . Marital status: Married    Spouse name: Not on file  . Number of children: Not on file  . Years of education: Not on file  . Highest education level: Not on file  Occupational History  . Not on file  Social Needs  . Emergency planning/management officer  strain: Not on file  . Food insecurity:    Worry: Not on file    Inability: Not on file  . Transportation needs:    Medical: Not on file    Non-medical: Not on file  Tobacco Use  . Smoking status: Current Every Day Smoker    Packs/day: 1.00    Years: 15.00    Pack years: 15.00    Types: Cigarettes  . Smokeless tobacco: Current User  Substance and Sexual Activity  . Alcohol use: No     Alcohol/week: 0.0 oz    Comment: past  . Drug use: Yes    Types: Cocaine    Comment: past  . Sexual activity: Never  Lifestyle  . Physical activity:    Days per week: Not on file    Minutes per session: Not on file  . Stress: Not on file  Relationships  . Social connections:    Talks on phone: Not on file    Gets together: Not on file    Attends religious service: Not on file    Active member of club or organization: Not on file    Attends meetings of clubs or organizations: Not on file    Relationship status: Not on file  Other Topics Concern  . Not on file  Social History Narrative  . Not on file   Additional Social History: N/A    Allergies:   Allergies  Allergen Reactions  . Penicillins Other (See Comments)    Childhood allergy  . Latex Rash  . Tape Rash    Labs:  Results for orders placed or performed during the hospital encounter of 05/17/17 (from the past 48 hour(s))  Comprehensive metabolic panel     Status: Abnormal   Collection Time: 05/17/17  6:17 PM  Result Value Ref Range   Sodium 138 135 - 145 mmol/L   Potassium 3.7 3.5 - 5.1 mmol/L   Chloride 107 101 - 111 mmol/L   CO2 23 22 - 32 mmol/L   Glucose, Bld 98 65 - 99 mg/dL   BUN 21 (H) 6 - 20 mg/dL   Creatinine, Ser 1.27 (H) 0.61 - 1.24 mg/dL   Calcium 9.3 8.9 - 10.3 mg/dL   Total Protein 7.7 6.5 - 8.1 g/dL   Albumin 3.9 3.5 - 5.0 g/dL   AST 34 15 - 41 U/L   ALT 31 17 - 63 U/L   Alkaline Phosphatase 87 38 - 126 U/L   Total Bilirubin 0.4 0.3 - 1.2 mg/dL   GFR calc non Af Amer >60 >60 mL/min   GFR calc Af Amer >60 >60 mL/min    Comment: (NOTE) The eGFR has been calculated using the CKD EPI equation. This calculation has not been validated in all clinical situations. eGFR's persistently <60 mL/min signify possible Chronic Kidney Disease.    Anion gap 8 5 - 15    Comment: Performed at [facility], [place] [address] [place], [place] [address]  Ethanol     Status: None    Collection Time: 05/17/17  6:17 PM  Result Value Ref Range   Alcohol, Ethyl (B) <10 <10 mg/dL    Comment:        LOWEST DETECTABLE LIMIT FOR SERUM ALCOHOL IS 10 mg/dL FOR MEDICAL PURPOSES ONLY Performed at [facility], [place] [address] [place], [place] [address]   Salicylate level     Status: None   Collection Time: 05/17/17  6:17 PM  Result Value Ref Range  Salicylate Lvl <9.5 2.8 - 30.0 mg/dL    Comment: Performed at [facility], [place] [address] [place], [name] [address]  Acetaminophen level     Status: Abnormal   Collection Time: 05/17/17  6:17 PM  Result Value Ref Range   Acetaminophen (Tylenol), Serum <10 (L) 10 - 30 ug/mL    Comment:        THERAPEUTIC CONCENTRATIONS VARY SIGNIFICANTLY. A RANGE OF 10-30 ug/mL MAY BE AN EFFECTIVE CONCENTRATION FOR MANY PATIENTS. HOWEVER, SOME ARE BEST TREATED AT CONCENTRATIONS OUTSIDE THIS RANGE. ACETAMINOPHEN CONCENTRATIONS >150 ug/mL AT 4 HOURS AFTER INGESTION AND >50 ug/mL AT 12 HOURS AFTER INGESTION ARE OFTEN ASSOCIATED WITH TOXIC REACTIONS. Performed at [facility], [place] [address], [place], [place] [address]   cbc     Status: Abnormal   Collection Time: 05/17/17  6:17 PM  Result Value Ref Range   WBC 6.4 4.0 - 10.5 K/uL   RBC 3.90 (L) 4.22 - 5.81 MIL/uL   Hemoglobin 12.3 (L) 13.0 - 17.0 g/dL   HCT 36.9 (L) 39.0 - 52.0 %   MCV 94.6 78.0 - 100.0 fL   MCH 31.5 26.0 - 34.0 pg   MCHC 33.3 30.0 - 36.0 g/dL   RDW 14.7 11.5 - 15.5 %   Platelets 226 150 - 400 K/uL    Comment: Performed at [facility], [place] [address], [place], [place] [address]  Rapid urine drug screen (hospital performed)     Status: Abnormal   Collection Time: 05/17/17  7:58 PM  Result Value Ref Range   Opiates NONE DETECTED NONE DETECTED   Cocaine POSITIVE (A) NONE DETECTED   Benzodiazepines NONE DETECTED NONE DETECTED   Amphetamines NONE DETECTED NONE DETECTED    Tetrahydrocannabinol NONE DETECTED NONE DETECTED   Barbiturates NONE DETECTED NONE DETECTED    Comment: (NOTE) DRUG SCREEN FOR MEDICAL PURPOSES ONLY.  IF CONFIRMATION IS NEEDED FOR ANY PURPOSE, NOTIFY LAB WITHIN 5 DAYS. LOWEST DETECTABLE LIMITS FOR URINE DRUG SCREEN Drug Class                     Cutoff (ng/mL) Amphetamine and metabolites    1000 Barbiturate and metabolites    200 Benzodiazepine                 952 Tricyclics and metabolites     300 Opiates and metabolites        300 Cocaine and metabolites        300 THC                            50 Performed at [facility], [place] [address], [place], [place] [address]     Current Facility-Administered Medications  Medication Dose Route Frequency Provider Last Rate Last Dose  . albuterol (PROVENTIL HFA;VENTOLIN HFA) 108 (90 Base) MCG/ACT inhaler 2 puff  2 puff Inhalation Q6H PRN Recardo Evangelist, PA-C      . amLODipine (NORVASC) tablet 10 mg  10 mg Oral QHS Recardo Evangelist, PA-C   10 mg at 05/17/17 2133  . bictegravir-emtricitabine-tenofovir AF (BIKTARVY) 50-200-25 MG per tablet 1 tablet  1 tablet Oral Daily Recardo Evangelist, PA-C      . FLUoxetine (PROZAC) capsule 10 mg  10 mg Oral Daily Patrecia Pour, NP      . OLANZapine (ZYPREXA) tablet 10 mg  10 mg Oral BID Patrecia Pour, NP      .  traZODone (DESYREL) tablet 200 mg  200 mg Oral QHS Recardo Evangelist, PA-C   200 mg at 05/17/17 2134   Current Outpatient Medications  Medication Sig Dispense Refill  . amLODipine (NORVASC) 10 MG tablet Take 1 tablet (10 mg total) by mouth at bedtime. 30 tablet 0  . bictegravir-emtricitabine-tenofovir AF (BIKTARVY) 50-200-25 MG TABS tablet Take 1 tablet by mouth daily. 30 tablet 5  . haloperidol (HALDOL) 10 MG tablet Take 10 mg by mouth at bedtime.    . haloperidol (HALDOL) 5 MG tablet Take 5 mg by mouth 4 (four) times daily.    Marland Kitchen OLANZapine (ZYPREXA) 10 MG tablet Take 1 tablet (10 mg total) by mouth at bedtime. 30  tablet 0  . polyethylene glycol (MIRALAX / GLYCOLAX) packet Take 17 g by mouth daily as needed. 10 each 0  . traZODone (DESYREL) 100 MG tablet Take 2 tablets (200 mg total) by mouth at bedtime. For insomnia. 30 tablet 0  . albuterol (PROVENTIL HFA;VENTOLIN HFA) 108 (90 BASE) MCG/ACT inhaler Inhale 2 puffs into the lungs every 6 (six) hours as needed for wheezing or shortness of breath. 8.5 g 0    Musculoskeletal: Strength & Muscle Tone: within normal limits Gait & Station: normal Patient leans: N/A  Psychiatric Specialty Exam: Physical Exam  Nursing note and vitals reviewed. Constitutional: He is oriented to person, place, and time. He appears well-developed and well-nourished.  HENT:  Head: Normocephalic and atraumatic.  Neck: Normal range of motion.  Respiratory: Effort normal.  Musculoskeletal: Normal range of motion.  Neurological: He is alert and oriented to person, place, and time.  Psychiatric: His speech is normal and behavior is normal. Judgment normal. Cognition and memory are normal. He exhibits a depressed mood. He expresses suicidal ideation.    Review of Systems  Psychiatric/Behavioral: Positive for depression, substance abuse and suicidal ideas.  All other systems reviewed and are negative.   Blood pressure 110/67, pulse 60, temperature 98.4 F (36.9 C), temperature source Oral, resp. rate 14, SpO2 97 %.There is no height or weight on file to calculate BMI.  General Appearance: Casual  Eye Contact:  Fair  Speech:  Normal Rate  Volume:  Decreased  Mood:  Depressed  Affect:  Congruent  Thought Process:  Coherent and Descriptions of Associations: Intact  Orientation:  Full (Time, Place, and Person)  Thought Content:  Rumination  Suicidal Thoughts:  Yes.  without intent/plan  Homicidal Thoughts:  No  Memory:  Immediate;   Fair Recent;   Fair Remote;   Fair  Judgement:  Fair  Insight:  Fair  Psychomotor Activity:  Decreased  Concentration:  Concentration: Fair  and Attention Span: Fair  Recall:  [employer] of Knowledge:  Fair  Language:  Good  Akathisia:  No  Handed:  Right  AIMS (if indicated):   N/A  Assets:  Leisure Time Physical Health Resilience  ADL's:  Intact  Cognition:  WNL  Sleep:   N/A     Treatment Plan Summary: Daily contact with patient to assess and evaluate symptoms and progress in treatment, Medication management and Plan cocaine abuse with cocaine induced mood disorder:  -Peer support consult placed  Schizoaffective disorder, depressed type: -Started Zyprexa 10 mg BID for psychosis -Started Prozac 10 mg daily for depression  Insomnia: -Restated his Trazodone 200 mg at bedtime for sleep  Disposition: Supportive therapy provided about ongoing stressors.  Waylan Boga, NP 05/18/2017 10:48 AM   Patient seen face-to-face for psychiatric evaluation, chart reviewed and case  discussed with the physician extender and developed treatment plan. Reviewed the information documented and agree with the treatment plan.  Buford Dresser, DO 05/19/17 12:58 AM

## 2017-05-19 DIAGNOSIS — R450 Nervousness: Secondary | ICD-10-CM

## 2017-05-19 DIAGNOSIS — B20 Human immunodeficiency virus [HIV] disease: Secondary | ICD-10-CM

## 2017-05-19 DIAGNOSIS — F419 Anxiety disorder, unspecified: Secondary | ICD-10-CM

## 2017-05-19 DIAGNOSIS — F329 Major depressive disorder, single episode, unspecified: Secondary | ICD-10-CM

## 2017-05-19 DIAGNOSIS — R4587 Impulsiveness: Secondary | ICD-10-CM

## 2017-05-19 DIAGNOSIS — Z653 Problems related to other legal circumstances: Secondary | ICD-10-CM

## 2017-05-19 MED ORDER — RISPERIDONE 1 MG PO TABS
1.0000 mg | ORAL_TABLET | Freq: Two times a day (BID) | ORAL | Status: DC
  Administered 2017-05-19: 1 mg via ORAL
  Filled 2017-05-19: qty 1

## 2017-05-19 MED ORDER — RISPERIDONE 1 MG PO TABS: 1.0000 mg | ORAL_TABLET | Freq: Two times a day (BID) | ORAL | 0 refills | Status: DC

## 2017-05-19 NOTE — Discharge Instructions (Signed)
Follow up with:  [facility] [place], [place] [address] [phone] Hour of operations: Monday-Friday, 8:30-5 p.m. Description: A variety of behavioral health services are offered, including: Open Access  Outpatient Therapy & Psychiatric Services  Assertive Community Treatment Team (ACTT) (adults only)  [facility] (children & adults)  [facility] (children & adults)  Peer Support Services If you need [facility] services, please contact our referral department at [phone]

## 2017-05-19 NOTE — Consult Note (Addendum)
[facility] Consult   Reason for Consult:  Suicidal ideation Referring Physician:  EDP Patient Identification: Dylan [name] MRN:  [id number] Principal Diagnosis: Cocaine abuse with cocaine-induced psychotic disorder [facility]) Diagnosis:   Patient Active Problem List   Diagnosis Date Noted  . Cocaine abuse with cocaine-induced mood disorder ([place]) [F14.14] 05/18/2017  . Schizoaffective disorder, bipolar type ([place]) [F25.0]   . Acute renal failure (ARF) ([place]) [N17.9]   . Suicidal ideations [R45.851]   . Chronic 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 coma (HCC) [B18.1]   . AKI (acute kidney injury) ([place]) [N17.9] 02/19/2017  . Rhabdomyolysis [M62.82] 02/19/2017  . Suicidal overdose ([place]) [T50.902A] 02/19/2017  . Acute hepatitis [B17.9] 02/19/2017  . Elevated LFTs [R94.5]   . Homicidal ideations [R45.850]   . Schizoaffective disorder, depressive type ([place]) [F25.1] 09/05/2011  . Cocaine abuse with cocaine-induced psychotic disorder ([place]) [F14.159] 09/05/2011  . Degenerative disc disease [IMO0002] 08/21/2011  . HIV disease ([place]) [B20] 08/17/2011    Total Time spent with patient: 45 minutes  Subjective:   Dylan [name] is a 52 y.o. male patient admitted with .  HPI:  Pt was seen and chart reviewed with treatment team and Dr Darleene Cleaver.  Pt denies suicidal/homicidal ideation, denies auditory/visual hallucinations and does not appear to be responding to internal stimuli. Pt stated he stopped taking his antipsychotic medications 5 days ago because someone told him there was voodoo in them. Pt has been compliant with his HIV medications. Pt stated he goes to [facility] for his medications but he does not like to take Haldol. Pt is on probation and wearing an ankle monitor. Pt has been rude and verbally abusive to the staff. Pt also managed to sneak his cell phone into his room.  Pt lives alone. Pt's UDS positive for cocaine, BAL negative. Pt is stable and psychiatrically  clear for discharge.   Past Psychiatric History: As above  Risk to Self: None Risk to Others: None Prior Inpatient Therapy: Prior Inpatient Therapy: Yes Prior Therapy Dates: UTA Prior Therapy Facilty/Provider(s): [facility], [place], [facility], and [facility].  Reason for Treatment: Halluncinations and SI. Prior Outpatient Therapy: Prior Outpatient Therapy: Yes Prior Therapy Dates: Current Prior Therapy Facilty/Provider(s): [facility]. Reason for Treatment: Medication management and counseling. Does patient have an ACCT team?: No Does patient have Intensive In-House Services?  : No Does patient have [facility] services? : Yes Does patient have P4CC services?: No  Past Medical History:  Past Medical History:  Diagnosis Date  . Arthritis   . Bipolar 1 disorder ([place])   . Depression   . Hepatitis B   . Human immunodeficiency virus (HIV) ([place])   . Schizophrenia [facility])     Past Surgical History:  Procedure Laterality Date  . IR FLUORO GUIDE CV LINE RIGHT  02/22/2017  . IR US GUIDE VASC ACCESS RIGHT  02/22/2017  . TUMOR REMOVAL     From Chest   Family History: No family history on file. Family Psychiatric  History: Unknown Social History:  Social History   Substance and Sexual Activity  Alcohol Use No  . Alcohol/week: 0.0 oz   Comment: past     Social History   Substance and Sexual Activity  Drug Use Yes  . Types: Cocaine   Comment: past    Social History   Socioeconomic History  . Marital status: Married    Spouse name: Not on file  . Number of children: Not on file  . Years of education:  Not on file  . Highest education level: Not on file  Occupational History  . Not on file  Social Needs  . Financial resource strain: Not on file  . Food insecurity:    Worry: Not on file    Inability: Not on file  . Transportation needs:    Medical: Not on file    Non-medical: Not on file  Tobacco Use  . Smoking status: Current Every Day Smoker    Packs/day: 1.00     Years: 15.00    Pack years: 15.00    Types: Cigarettes  . Smokeless tobacco: Current User  Substance and Sexual Activity  . Alcohol use: No    Alcohol/week: 0.0 oz    Comment: past  . Drug use: Yes    Types: Cocaine    Comment: past  . Sexual activity: Never  Lifestyle  . Physical activity:    Days per week: Not on file    Minutes per session: Not on file  . Stress: Not on file  Relationships  . Social connections:    Talks on phone: Not on file    Gets together: Not on file    Attends religious service: Not on file    Active member of club or organization: Not on file    Attends meetings of clubs or organizations: Not on file    Relationship status: Not on file  Other Topics Concern  . Not on file  Social History Narrative  . Not on file   Additional Social History:    Allergies:   Allergies  Allergen Reactions  . Penicillins Other (See Comments)    Childhood allergy  . Latex Rash  . Tape Rash    Labs:  Results for orders placed or performed during the hospital encounter of 05/17/17 (from the past 48 hour(s))  Comprehensive metabolic panel     Status: Abnormal   Collection Time: 05/17/17  6:17 PM  Result Value Ref Range   Sodium 138 135 - 145 mmol/L   Potassium 3.7 3.5 - 5.1 mmol/L   Chloride 107 101 - 111 mmol/L   CO2 23 22 - 32 mmol/L   Glucose, Bld 98 65 - 99 mg/dL   BUN 21 (H) 6 - 20 mg/dL   Creatinine, Ser 1.27 (H) 0.61 - 1.24 mg/dL   Calcium 9.3 8.9 - 10.3 mg/dL   Total Protein 7.7 6.5 - 8.1 g/dL   Albumin 3.9 3.5 - 5.0 g/dL   AST 34 15 - 41 U/L   ALT 31 17 - 63 U/L   Alkaline Phosphatase 87 38 - 126 U/L   Total Bilirubin 0.4 0.3 - 1.2 mg/dL   GFR calc non Af Amer >60 >60 mL/min   GFR calc Af Amer >60 >60 mL/min    Comment: (NOTE) The eGFR has been calculated using the CKD EPI equation. This calculation has not been validated in all clinical situations. eGFR's persistently <60 mL/min signify possible Chronic Kidney Disease.    Anion gap  8 5 - 15    Comment: Performed at [facility], [place] [address], [place], [place] [address]  Ethanol     Status: None   Collection Time: 05/17/17  6:17 PM  Result Value Ref Range   Alcohol, Ethyl (B) <10 <10 mg/dL    Comment:        LOWEST DETECTABLE LIMIT FOR SERUM ALCOHOL IS 10 mg/dL FOR MEDICAL PURPOSES ONLY Performed at [facility], [place] [address] [place],  [place] [address]   Salicylate level     Status: None   Collection Time: 05/17/17  6:17 PM  Result Value Ref Range   Salicylate Lvl <9.3 2.8 - 30.0 mg/dL    Comment: Performed at [facility], [place] [address], [place], [name] [address]  Acetaminophen level     Status: Abnormal   Collection Time: 05/17/17  6:17 PM  Result Value Ref Range   Acetaminophen (Tylenol), Serum <10 (L) 10 - 30 ug/mL    Comment:        THERAPEUTIC CONCENTRATIONS VARY SIGNIFICANTLY. A RANGE OF 10-30 ug/mL MAY BE AN EFFECTIVE CONCENTRATION FOR MANY PATIENTS. HOWEVER, SOME ARE BEST TREATED AT CONCENTRATIONS OUTSIDE THIS RANGE. ACETAMINOPHEN CONCENTRATIONS >150 ug/mL AT 4 HOURS AFTER INGESTION AND >50 ug/mL AT 12 HOURS AFTER INGESTION ARE OFTEN ASSOCIATED WITH TOXIC REACTIONS. Performed at [facility], [place] [address], [place], [place] [address]   cbc     Status: Abnormal   Collection Time: 05/17/17  6:17 PM  Result Value Ref Range   WBC 6.4 4.0 - 10.5 K/uL   RBC 3.90 (L) 4.22 - 5.81 MIL/uL   Hemoglobin 12.3 (L) 13.0 - 17.0 g/dL   HCT 36.9 (L) 39.0 - 52.0 %   MCV 94.6 78.0 - 100.0 fL   MCH 31.5 26.0 - 34.0 pg   MCHC 33.3 30.0 - 36.0 g/dL   RDW 14.7 11.5 - 15.5 %   Platelets 226 150 - 400 K/uL    Comment: Performed at [facility], [place] [address] [place], [place] [address]  Rapid urine drug screen (hospital performed)     Status: Abnormal   Collection Time: 05/17/17  7:58 PM  Result Value Ref Range   Opiates NONE DETECTED NONE DETECTED    Cocaine POSITIVE (A) NONE DETECTED   Benzodiazepines NONE DETECTED NONE DETECTED   Amphetamines NONE DETECTED NONE DETECTED   Tetrahydrocannabinol NONE DETECTED NONE DETECTED   Barbiturates NONE DETECTED NONE DETECTED    Comment: (NOTE) DRUG SCREEN FOR MEDICAL PURPOSES ONLY.  IF CONFIRMATION IS NEEDED FOR ANY PURPOSE, NOTIFY LAB WITHIN 5 DAYS. LOWEST DETECTABLE LIMITS FOR URINE DRUG SCREEN Drug Class                     Cutoff (ng/mL) Amphetamine and metabolites    1000 Barbiturate and metabolites    200 Benzodiazepine                 623 Tricyclics and metabolites     300 Opiates and metabolites        300 Cocaine and metabolites        300 THC                            50 Performed at [facility], [place] [address] [place], [place] [address]     Current Facility-Administered Medications  Medication Dose Route Frequency Provider Last Rate Last Dose  . albuterol (PROVENTIL HFA;VENTOLIN HFA) 108 (90 Base) MCG/ACT inhaler 2 puff  2 puff Inhalation Q6H PRN Recardo Evangelist, PA-C      . amLODipine (NORVASC) tablet 10 mg  10 mg Oral QHS Recardo Evangelist, PA-C   10 mg at 05/18/17 2112  . bictegravir-emtricitabine-tenofovir AF (BIKTARVY) 50-200-25 MG per tablet 1 tablet  1 tablet Oral Daily Recardo Evangelist, PA-C   1 tablet at 05/19/17 1101  . FLUoxetine (PROZAC) capsule 10  mg  10 mg Oral Daily Patrecia Pour, NP   10 mg at 05/19/17 1101  . risperiDONE (RISPERDAL) tablet 1 mg  1 mg Oral BID Nelta Caudill, MD   1 mg at 05/19/17 1102  . traZODone (DESYREL) tablet 200 mg  200 mg Oral QHS Recardo Evangelist, PA-C   200 mg at 05/18/17 2112   Current Outpatient Medications  Medication Sig Dispense Refill  . amLODipine (NORVASC) 10 MG tablet Take 1 tablet (10 mg total) by mouth at bedtime. 30 tablet 0  . bictegravir-emtricitabine-tenofovir AF (BIKTARVY) 50-200-25 MG TABS tablet Take 1 tablet by mouth daily. 30 tablet 5  . haloperidol (HALDOL) 10 MG tablet Take 10  mg by mouth at bedtime.    . haloperidol (HALDOL) 5 MG tablet Take 5 mg by mouth 4 (four) times daily.    Marland Kitchen OLANZapine (ZYPREXA) 10 MG tablet Take 1 tablet (10 mg total) by mouth at bedtime. 30 tablet 0  . polyethylene glycol (MIRALAX / GLYCOLAX) packet Take 17 g by mouth daily as needed. 10 each 0  . traZODone (DESYREL) 100 MG tablet Take 2 tablets (200 mg total) by mouth at bedtime. For insomnia. 30 tablet 0  . albuterol (PROVENTIL HFA;VENTOLIN HFA) 108 (90 BASE) MCG/ACT inhaler Inhale 2 puffs into the lungs every 6 (six) hours as needed for wheezing or shortness of breath. 8.5 g 0    Musculoskeletal: Strength & Muscle Tone: within normal limits Gait & Station: normal Patient leans: N/A  Psychiatric Specialty Exam: Physical Exam  Constitutional: He is oriented to person, place, and time. He appears well-developed.  HENT:  Head: Normocephalic.  Respiratory: Effort normal.  Musculoskeletal: Normal range of motion.  Neurological: He is alert and oriented to person, place, and time.  Psychiatric: His speech is normal and behavior is normal. Thought content normal. His mood appears anxious. Cognition and memory are normal. He expresses impulsivity. He exhibits a depressed mood.    Review of Systems  Psychiatric/Behavioral: Positive for depression and substance abuse. Negative for hallucinations, memory loss and suicidal ideas. The patient is nervous/anxious. The patient does not have insomnia.   All other systems reviewed and are negative.   Blood pressure 110/73, pulse 63, temperature 97.6 F (36.4 C), temperature source Oral, resp. rate 18, SpO2 98 %.There is no height or weight on file to calculate BMI.  General Appearance: Casual  Eye Contact:  Fair  Speech:  Clear and Coherent  Volume:  Normal  Mood:  Anxious and Depressed  Affect:  Congruent and Depressed  Thought Process:  Coherent and Linear  Orientation:  Full (Time, Place, and Person)  Thought Content:  Logical  Suicidal  Thoughts:  No  Homicidal Thoughts:  No  Memory:  Immediate;   Good Recent;   Good Remote;   Fair  Judgement:  Impaired  Insight:  Shallow  Psychomotor Activity:  Normal  Concentration:  Concentration: Good and Attention Span: Good  Recall:  Good  Fund of Knowledge:  Good  Language:  Good  Akathisia:  No  Handed:  Right  AIMS (if indicated):     Assets:  Communication Skills Housing Social Support  ADL's:  Intact  Cognition:  WNL  Sleep:        Treatment Plan Summary: Plan Cocaine abuse with cocaine-induced psychotic disorder [facility])  Discharge Home Take all medications as prescribed Follow up with [facility] for medication management. Avoid the use of alcohol and illicit drugs.  Disposition: No evidence of imminent risk to self  or others at present.   Patient does not meet criteria for psychiatric inpatient admission. Supportive therapy provided about ongoing stressors. Discussed crisis plan, support from social network, calling 911, coming to the Emergency Department, and calling Suicide Hotline.  Ethelene Hal, NP 05/19/2017 11:08 AM  Patient seen face-to-face for psychiatric evaluation, chart reviewed and case discussed with the physician extender and developed treatment plan. Reviewed the information documented and agree with the treatment plan. Corena Pilgrim, MD

## 2017-05-19 NOTE — ED Notes (Signed)
Patient resting in bed, cooperative with medication administration.  Requested snack which was provided.  Alert and oriented.

## 2017-05-19 NOTE — [facility] Suicide Risk Assessment (Signed)
Suicide Risk Assessment  Discharge Assessment   [facility] Discharge Suicide Risk Assessment   Principal Problem: Cocaine abuse with cocaine-induced psychotic disorder [facility]) Discharge Diagnoses:  Patient Active Problem List   Diagnosis Date Noted  . Cocaine abuse with cocaine-induced mood disorder ([place]) [F14.14] 05/18/2017  . Schizoaffective disorder, bipolar type ([place]) [F25.0]   . Acute renal failure (ARF) ([place]) [N17.9]   . Suicidal ideations [R45.851]   . Chronic 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 coma (HCC) [B18.1]   . AKI (acute kidney injury) ([place]) [N17.9] 02/19/2017  . Rhabdomyolysis [M62.82] 02/19/2017  . Suicidal overdose ([place]) [T50.902A] 02/19/2017  . Acute hepatitis [B17.9] 02/19/2017  . Elevated LFTs [R94.5]   . Homicidal ideations [R45.850]   . Schizoaffective disorder, depressive type ([place]) [F25.1] 09/05/2011  . Cocaine abuse with cocaine-induced psychotic disorder ([place]) [F14.159] 09/05/2011  . Degenerative disc disease [IMO0002] 08/21/2011  . HIV disease ([place]) [B20] 08/17/2011    Total Time spent with patient: 45 minutes  Musculoskeletal: Strength & Muscle Tone: within normal limits Gait & Station: normal Patient leans: N/A  Psychiatric Specialty Exam: Physical Exam  Constitutional: He is oriented to person, place, and time. He appears well-developed.  HENT:  Head: Normocephalic.  Respiratory: Effort normal.  Musculoskeletal: Normal range of motion.  Neurological: He is alert and oriented to person, place, and time.  Psychiatric: His speech is normal and behavior is normal. Thought content normal. His mood appears anxious. Cognition and memory are normal. He expresses impulsivity. He exhibits a depressed mood.   Review of Systems  Psychiatric/Behavioral: Positive for depression and substance abuse. Negative for hallucinations, memory loss and suicidal ideas. The patient is nervous/anxious. The patient does not have insomnia.   All other systems  reviewed and are negative.  Blood pressure 110/73, pulse 63, temperature 97.6 F (36.4 C), temperature source Oral, resp. rate 18, SpO2 98 %.There is no height or weight on file to calculate BMI. General Appearance: Casual Eye Contact:  Fair Speech:  Clear and Coherent Volume:  Normal Mood:  Anxious and Depressed Affect:  Congruent and Depressed Thought Process:  Coherent and Linear Orientation:  Full (Time, Place, and Person) Thought Content:  Logical Suicidal Thoughts:  No Homicidal Thoughts:  No Memory:  Immediate;   Good Recent;   Good Remote;   Fair Judgement:  Impaired Insight:  Shallow Psychomotor Activity:  Normal Concentration:  Concentration: Good and Attention Span: Good Recall:  Good Fund of Knowledge:  Good Language:  Good Akathisia:  No Handed:  Right AIMS (if indicated):    Assets:  Communication Skills Housing Social Support ADL's:  Intact Cognition:  WNL   Mental Status Per Nursing Assessment::   On Admission:   Suicidal ideation while high on cocaine  Demographic Factors:  Male, Low socioeconomic status and Living alone  Loss Factors: Legal issues and Financial problems/change in socioeconomic status  Historical Factors: Impulsivity  Risk Reduction Factors:   Sense of responsibility to family  Continued Clinical Symptoms:  Bipolar Disorder:   Depressive phase Depression:   Impulsivity Alcohol/Substance Abuse/Dependencies  Cognitive Features That Contribute To Risk:  Closed-mindedness    Suicide Risk:  Minimal: No identifiable suicidal ideation.  Patients presenting with no risk factors but with morbid ruminations; may be classified as minimal risk based on the severity of the depressive symptoms    Plan Of Care/Follow-up recommendations:  Activity:  as tolerated Diet:  Heart Healthy  Ethelene Hal, NP 05/19/2017, 11:25 AM

## 2017-05-19 NOTE — ED Notes (Signed)
Pt came to nurses station demanding charger for ankle bracelet. This Radiographer, therapeutic from locker #34. This Probation officer and RN Caren Griffins) entered pts room,  gave the pt a choice to either sit in front of nurses station or in chair in room where he could be clearly monitored on video. Pt became verbally aggressive towards this writer stating "I'm not sitting in that hard ass chair for 2 hours. Go ahead and discharge me! I dont want to be here anyway!" This Probation officer let pt know that the doctor had to see him before he could be discharged. Pt eventually sat in chair in room, plugged charger in and was monitored via video monitoring and 15 minute checks.

## 2017-05-19 NOTE — ED Notes (Signed)
Pt moved from chair to bed while charging ankle bracelet. This Probation officer respectfully asked pt to move back to chair, pt stated "I'm laying in this mother fucking bed." RN notified of situation.

## 2017-05-19 NOTE — ED Notes (Signed)
This Probation officer respectfully approached pt stating the guidelines of the unit, as cell phones are considered contraband and pts are not able to have them. This Probation officer respectfully and politely asked the pt to hand over his cell phone. Pt was respectful and handed this writer his cell phone. This Probation officer secured cell phone in locker #34 with pts other belongings. RN was notified.

## 2017-05-19 NOTE — ED Notes (Signed)
Pt observed on video monitoring checking cell phone while in bathroom. RN notified.

## 2017-06-19 ENCOUNTER — Other Ambulatory Visit: Payer: Self-pay | Admitting: *Deleted

## 2017-06-19 ENCOUNTER — Encounter: Payer: Self-pay | Admitting: Internal Medicine

## 2017-06-19 ENCOUNTER — Ambulatory Visit (INDEPENDENT_AMBULATORY_CARE_PROVIDER_SITE_OTHER): Payer: Self-pay | Admitting: Internal Medicine

## 2017-06-19 VITALS — BP 144/90 | HR 92 | Temp 98.6°F | Wt 214.0 lb

## 2017-06-19 DIAGNOSIS — A630 Anogenital (venereal) warts: Secondary | ICD-10-CM

## 2017-06-19 DIAGNOSIS — Z23 Encounter for immunization: Secondary | ICD-10-CM

## 2017-06-19 DIAGNOSIS — R768 Other specified abnormal immunological findings in serum: Secondary | ICD-10-CM

## 2017-06-19 DIAGNOSIS — B20 Human immunodeficiency virus [HIV] disease: Secondary | ICD-10-CM

## 2017-06-19 DIAGNOSIS — Z915 Personal history of self-harm: Secondary | ICD-10-CM

## 2017-06-19 DIAGNOSIS — Z9151 Personal history of suicidal behavior: Secondary | ICD-10-CM

## 2017-06-19 NOTE — Patient Instructions (Signed)
[facility] phone number is [phone] to reschedule

## 2017-06-19 NOTE — Progress Notes (Signed)
Patient ID: Dylan [name], male   DOB: 07-12-1965, 52 y.o.   MRN: [id number]  HPI Dylan [name] is a 52yo M with HIV disease, depression. Recently hospitalized for SI, AKI 2/2 rhabdo where he had temp HD now his kidney function returned to baseline. He reports taking his medications daily though he is not taking his mental health meds. No recent illness. Remains homeless.  Outpatient Encounter Medications as of 06/19/2017  Medication Sig  . bictegravir-emtricitabine-tenofovir AF (BIKTARVY) 50-200-25 MG TABS tablet Take 1 tablet by mouth daily.  Marland Kitchen albuterol (PROVENTIL HFA;VENTOLIN HFA) 108 (90 BASE) MCG/ACT inhaler Inhale 2 puffs into the lungs every 6 (six) hours as needed for wheezing or shortness of breath.  Marland Kitchen amLODipine (NORVASC) 10 MG tablet Take 1 tablet (10 mg total) by mouth at bedtime. (Patient not taking: Reported on 06/19/2017)  . haloperidol (HALDOL) 10 MG tablet Take 10 mg by mouth at bedtime.  . haloperidol (HALDOL) 5 MG tablet Take 5 mg by mouth 4 (four) times daily.  Marland Kitchen OLANZapine (ZYPREXA) 10 MG tablet Take 1 tablet (10 mg total) by mouth at bedtime. (Patient not taking: Reported on 06/19/2017)  . polyethylene glycol (MIRALAX / GLYCOLAX) packet Take 17 g by mouth daily as needed. (Patient not taking: Reported on 06/19/2017)  . risperiDONE (RISPERDAL) 1 MG tablet Take 1 tablet (1 mg total) by mouth 2 (two) times daily. (Patient not taking: Reported on 06/19/2017)  . traZODone (DESYREL) 100 MG tablet Take 2 tablets (200 mg total) by mouth at bedtime. For insomnia. (Patient not taking: Reported on 06/19/2017)   No facility-administered encounter medications on file as of 06/19/2017.      Patient Active Problem List   Diagnosis Date Noted  . Cocaine abuse with cocaine-induced mood disorder ([place]) 05/18/2017  . Schizoaffective disorder, bipolar type ([place])   . Acute renal failure (ARF) ([place])   . Suicidal ideations   . Chronic 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 coma ([place])     . AKI (acute kidney injury) ([place]) 02/19/2017  . Rhabdomyolysis 02/19/2017  . Suicidal overdose ([place]) 02/19/2017  . Acute hepatitis 02/19/2017  . Elevated LFTs   . Homicidal ideations   . Schizoaffective disorder, depressive type ([place]) 09/05/2011  . 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 Degenerative disc disease 08/21/2011  . HIV disease ([place]) 08/17/2011     Health Maintenance Due  Topic Date Due  . TETANUS/TDAP  08/01/1984  . COLONOSCOPY  08/02/2015    Social History   Tobacco Use  . Smoking status: Current Every Day Smoker    Packs/day: 1.00    Years: 15.00    Pack years: 15.00    Types: Cigarettes  . Smokeless tobacco: Current User  Substance Use Topics  . Alcohol use: No    Comment: past  . Drug use: Yes    Types: Cocaine    Comment: past    Review of Systems Review of Systems  Constitutional: Negative for fever, chills, diaphoresis, activity change, appetite change, fatigue and unexpected weight change.  HENT: Negative for congestion, sore throat, rhinorrhea, sneezing, trouble swallowing and sinus pressure.  Eyes: Negative for photophobia and visual disturbance.  Respiratory: Negative for cough, chest tightness, shortness of breath, wheezing and stridor.  Cardiovascular: Negative for chest pain, palpitations and leg swelling.  Gastrointestinal: Negative for nausea, vomiting, abdominal pain, diarrhea, constipation, blood in stool, abdominal distention and anal bleeding.  Genitourinary: Negative for dysuria, hematuria, flank pain and difficulty urinating.  Musculoskeletal:  Negative for myalgias, back pain, joint swelling, arthralgias and gait problem.  Skin: Negative for color change, pallor, rash and wound.  Neurological: Negative for dizziness, tremors, weakness and light-headedness.  Hematological: Negative for adenopathy. Does not bruise/bleed easily.  Psychiatric/Behavioral: Negative for behavioral problems, confusion, sleep  disturbance, dysphoric mood, decreased concentration and agitation.    Physical Exam   BP (!) 144/90   Pulse 92   Temp 98.6 F (37 C) (Oral)   Wt 214 lb (97.1 kg)   BMI 27.48 kg/m   Physical Exam  Constitutional: He is oriented to person, place, and time. He appears well-developed and well-nourished. No distress.  HENT:  Mouth/Throat: Oropharynx is clear and moist. No oropharyngeal exudate.  Cardiovascular: Normal rate, regular rhythm and normal heart sounds. Exam reveals no gallop and no friction rub.  No murmur heard.  Pulmonary/Chest: Effort normal and breath sounds normal. No respiratory distress. He has no wheezes.  Abdominal: Soft. Bowel sounds are normal. He exhibits no distension. There is no tenderness.  Lymphadenopathy:  He has no cervical adenopathy.  Neurological: He is alert and oriented to person, place, and time.  Skin: Skin is warm and dry. No rash noted. No erythema.  Psychiatric: He has a normal mood and affect. His behavior is normal.    Lab Results  Component Value Date   CD4TCELL 24 (L) 02/08/2012   Lab Results  Component Value Date   CD4TABS 600 02/08/2012   CD4TABS 500 11/09/2011   CD4TABS 560 08/18/2011   Lab Results  Component Value Date   HIV1RNAQUANT <20 02/21/2017   Lab Results  Component Value Date   HEPBSAB NEG 08/18/2011   Lab Results  Component Value Date   LABRPR NON REAC 08/18/2011    CBC Lab Results  Component Value Date   WBC 6.4 05/17/2017   RBC 3.90 (L) 05/17/2017   HGB 12.3 (L) 05/17/2017   HCT 36.9 (L) 05/17/2017   PLT 226 05/17/2017   MCV 94.6 05/17/2017   MCH 31.5 05/17/2017   MCHC 33.3 05/17/2017   RDW 14.7 05/17/2017   LYMPHSABS 2.5 03/24/2017   MONOABS 0.7 03/24/2017   EOSABS 0.2 03/24/2017    BMET Lab Results  Component Value Date   NA 138 05/17/2017   K 3.7 05/17/2017   CL 107 05/17/2017   CO2 23 05/17/2017   GLUCOSE 98 05/17/2017   BUN 21 (H) 05/17/2017   CREATININE 1.27 (H) 05/17/2017    CALCIUM 9.3 05/17/2017   GFRNONAA >60 05/17/2017   GFRAA >60 05/17/2017      Assessment and Plan  hiv disease = will check his CD 4 count and VL anticipate to continue with biktarvy  Hx of anal warts = has appt with anchor study at [facility] it appears in the system that needed to be rescheduled. Will give him # to call  Hep B S Ag+, Core Ab + = will check hep B Surface ab. Will give hep A due to homelessness  aki from rhabdo/overdose = appears that he is much improved and back to baseline  Health maintenance = will give hep A #1 and prevnar #13 today  Suicide ideation, hx of depression = not taking his meds. Recommended to continue to see [facility]

## 2017-06-20 ENCOUNTER — Other Ambulatory Visit: Payer: Self-pay

## 2017-07-24 ENCOUNTER — Other Ambulatory Visit: Payer: Self-pay

## 2017-07-24 DIAGNOSIS — B20 Human immunodeficiency virus [HIV] disease: Secondary | ICD-10-CM

## 2017-07-25 LAB — T-HELPER CELL (CD4) - (RCID CLINIC ONLY)
CD4 T CELL ABS: 700 /uL (ref 400–2700)
CD4 T CELL HELPER: 32 % — AB (ref 33–55)

## 2017-07-25 LAB — HEPATITIS B SURFACE ANTIBODY,QUALITATIVE: Hep B S Ab: NONREACTIVE

## 2017-07-26 LAB — HIV-1 RNA QUANT-NO REFLEX-BLD
HIV 1 RNA Quant: <20 copies/mL
HIV-1 RNA Quant, Log: <1.3 Log copies/mL

## 2017-07-31 ENCOUNTER — Ambulatory Visit: Payer: Self-pay | Admitting: Internal Medicine

## 2017-08-08 ENCOUNTER — Encounter: Payer: Self-pay | Admitting: Internal Medicine

## 2017-08-13 ENCOUNTER — Ambulatory Visit (INDEPENDENT_AMBULATORY_CARE_PROVIDER_SITE_OTHER): Payer: Self-pay | Admitting: Internal Medicine

## 2017-08-13 ENCOUNTER — Encounter: Payer: Self-pay | Admitting: Internal Medicine

## 2017-08-13 VITALS — BP 116/71 | HR 106 | Temp 97.6°F | Ht 75.0 in | Wt 213.0 lb

## 2017-08-13 DIAGNOSIS — F251 Schizoaffective disorder, depressive type: Secondary | ICD-10-CM

## 2017-08-13 DIAGNOSIS — B20 Human immunodeficiency virus [HIV] disease: Secondary | ICD-10-CM

## 2017-08-13 DIAGNOSIS — N182 Chronic kidney disease, stage 2 (mild): Secondary | ICD-10-CM

## 2017-08-13 LAB — BASIC METABOLIC PANEL
BUN: 18 mg/dL (ref 7–25)
CHLORIDE: 103 mmol/L (ref 98–110)
CO2: 28 mmol/L (ref 20–32)
CREATININE: 1.33 mg/dL (ref 0.70–1.33)
Calcium: 9.9 mg/dL (ref 8.6–10.3)
Glucose, Bld: 116 mg/dL — ABNORMAL HIGH (ref 65–99)
POTASSIUM: 4.1 mmol/L (ref 3.5–5.3)
SODIUM: 138 mmol/L (ref 135–146)

## 2017-08-13 NOTE — Progress Notes (Signed)
EUM:PNTIRW up for hiv disease  Patient ID: Dylan [name], male   DOB: 21-Oct-1965, 52 y.o.   MRN: [id number]  HPI  52 yo M HIV disease, depression hx of SI, led to rhabdo and AKI had temp HD, but now kidney function regained. Cr stable at 1.3. He is doing well with his medication adherence. Follows up with his mental health counselors. Denies any issues presently. No new partners.  Outpatient Encounter Medications as of 08/13/2017  Medication Sig  . bictegravir-emtricitabine-tenofovir AF (BIKTARVY) 50-200-25 MG TABS tablet Take 1 tablet by mouth daily.  . hydrOXYzine (ATARAX/VISTARIL) 25 MG tablet Take 25 mg by mouth 3 (three) times daily as needed.  Marland Kitchen QUEtiapine (SEROQUEL) 200 MG tablet Take 200 mg by mouth at bedtime.  . traZODone (DESYREL) 100 MG tablet Take 2 tablets (200 mg total) by mouth at bedtime. For insomnia.  Marland Kitchen albuterol (PROVENTIL HFA;VENTOLIN HFA) 108 (90 BASE) MCG/ACT inhaler Inhale 2 puffs into the lungs every 6 (six) hours as needed for wheezing or shortness of breath.  Marland Kitchen amLODipine (NORVASC) 10 MG tablet Take 1 tablet (10 mg total) by mouth at bedtime. (Patient not taking: Reported on 08/13/2017)  . haloperidol (HALDOL) 10 MG tablet Take 10 mg by mouth at bedtime.  . haloperidol (HALDOL) 5 MG tablet Take 5 mg by mouth 4 (four) times daily.  Marland Kitchen OLANZapine (ZYPREXA) 10 MG tablet Take 1 tablet (10 mg total) by mouth at bedtime. (Patient not taking: Reported on 08/13/2017)  . polyethylene glycol (MIRALAX / GLYCOLAX) packet Take 17 g by mouth daily as needed. (Patient not taking: Reported on 08/13/2017)  . risperiDONE (RISPERDAL) 1 MG tablet Take 1 tablet (1 mg total) by mouth 2 (two) times daily. (Patient not taking: Reported on 08/13/2017)   No facility-administered encounter medications on file as of 08/13/2017.      Patient Active Problem List   Diagnosis Date Noted  . Cocaine abuse with cocaine-induced mood disorder ([place]) 05/18/2017  . Schizoaffective disorder, bipolar type  ([place])   . Acute renal failure (ARF) ([place])   . Suicidal ideations   . Chronic 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 coma ([place])   . AKI (acute kidney injury) ([place]) 02/19/2017  . Rhabdomyolysis 02/19/2017  . Suicidal overdose ([place]) 02/19/2017  . Acute hepatitis 02/19/2017  . Elevated LFTs   . Homicidal ideations   . Schizoaffective disorder, depressive type ([place]) 09/05/2011  . 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 Degenerative disc disease 08/21/2011  . HIV disease ([place]) 08/17/2011     Health Maintenance Due  Topic Date Due  . TETANUS/TDAP  08/01/1984  . COLONOSCOPY  08/02/2015  . INFLUENZA VACCINE  08/09/2017    Social History   Tobacco Use  . Smoking status: Current Every Day Smoker    Packs/day: 1.00    Years: 15.00    Pack years: 15.00    Types: Cigarettes  . Smokeless tobacco: Current User  Substance Use Topics  . Alcohol use: No    Comment: past  . Drug use: Yes    Types: Cocaine    Comment: past   Review of Systems 12 point ros is negative Physical Exam   BP 116/71   Pulse (!) 106   Temp 97.6 F (36.4 C)   Ht 6\' 3"  (1.905 m)   Wt 213 lb (96.6 kg)   BMI 26.62 kg/m   Physical Exam  Constitutional: He is oriented to person, place, and time. He appears well-developed  and well-nourished. No distress.  HENT:  Mouth/Throat: Oropharynx is clear and moist. No oropharyngeal exudate.  Cardiovascular: Normal rate, regular rhythm and normal heart sounds. Exam reveals no gallop and no friction rub.  No murmur heard.  Pulmonary/Chest: Effort normal and breath sounds normal. No respiratory distress. He has no wheezes.  Abdominal: Soft. Bowel sounds are normal. He exhibits no distension. There is no tenderness.  Lymphadenopathy:  He has no cervical adenopathy.  Neurological: He is alert and oriented to person, place, and time.  Skin: Skin is warm and dry. No rash noted. No erythema.  Psychiatric: He has a normal mood  and affect. His behavior is normal.     Lab Results  Component Value Date   CD4TCELL 32 (L) 07/24/2017   Lab Results  Component Value Date   CD4TABS 700 07/24/2017   CD4TABS 600 02/08/2012   CD4TABS 500 11/09/2011   Lab Results  Component Value Date   HIV1RNAQUANT <20 NOT DETECTED 07/24/2017   Lab Results  Component Value Date   HEPBSAB NON-REACTIVE 07/24/2017   Lab Results  Component Value Date   LABRPR NON REAC 08/18/2011    CBC Lab Results  Component Value Date   WBC 6.4 05/17/2017   RBC 3.90 (L) 05/17/2017   HGB 12.3 (L) 05/17/2017   HCT 36.9 (L) 05/17/2017   PLT 226 05/17/2017   MCV 94.6 05/17/2017   MCH 31.5 05/17/2017   MCHC 33.3 05/17/2017   RDW 14.7 05/17/2017   LYMPHSABS 2.5 03/24/2017   MONOABS 0.7 03/24/2017   EOSABS 0.2 03/24/2017    BMET Lab Results  Component Value Date   NA 138 05/17/2017   K 3.7 05/17/2017   CL 107 05/17/2017   CO2 23 05/17/2017   GLUCOSE 98 05/17/2017   BUN 21 (H) 05/17/2017   CREATININE 1.27 (H) 05/17/2017   CALCIUM 9.3 05/17/2017   GFRNONAA >60 05/17/2017   GFRAA >60 05/17/2017      Assessment and Plan  - hiv = well controlled. 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

## 2017-08-16 ENCOUNTER — Encounter: Payer: Self-pay | Admitting: Internal Medicine

## 2017-10-14 ENCOUNTER — Other Ambulatory Visit: Payer: Self-pay | Admitting: Internal Medicine

## 2017-10-14 DIAGNOSIS — B20 Human immunodeficiency virus [HIV] disease: Secondary | ICD-10-CM

## 2017-10-23 ENCOUNTER — Ambulatory Visit (INDEPENDENT_AMBULATORY_CARE_PROVIDER_SITE_OTHER): Payer: Self-pay | Admitting: Internal Medicine

## 2017-10-23 VITALS — BP 124/77 | HR 84 | Temp 97.9°F | Ht 74.0 in | Wt 224.0 lb

## 2017-10-23 DIAGNOSIS — K760 Fatty (change of) liver, not elsewhere classified: Secondary | ICD-10-CM

## 2017-10-23 DIAGNOSIS — B20 Human immunodeficiency virus [HIV] disease: Secondary | ICD-10-CM

## 2017-10-23 DIAGNOSIS — Z23 Encounter for immunization: Secondary | ICD-10-CM

## 2017-10-23 NOTE — Progress Notes (Signed)
Flu vaccine administered today. Patient tolerated well. Patient would like to follow up with [facility], but does not have time today. Provided Rollene Fare with facesheet to contact patient. S.Indigo Chaddock, LPN

## 2017-10-23 NOTE — Progress Notes (Signed)
RFV: follow up for hiv disease  Patient ID: Dylan [name], male   DOB: 1965-02-17, 52 y.o.   MRN: [id number]  HPI Dylan [name] is a 52yo M with hiv disease, on biktarvy. Doing well on regimen. He has questions regarding intimacy with his significant other and her concerns about having baby with hip. He is overall doing well. He is excieted that he has a new apartment  Outpatient Encounter Medications as of 10/23/2017  Medication Sig  . amLODipine (NORVASC) 10 MG tablet Take 1 tablet (10 mg total) by mouth at bedtime.  Marland Kitchen BIKTARVY 50-200-25 MG TABS tablet TAKE 1 TABLET BY MOUTH DAILY  . haloperidol (HALDOL) 10 MG tablet Take 10 mg by mouth at bedtime.  . haloperidol (HALDOL) 5 MG tablet Take 5 mg by mouth 4 (four) times daily.  . hydrOXYzine (ATARAX/VISTARIL) 25 MG tablet Take 25 mg by mouth 3 (three) times daily as needed.  Marland Kitchen OLANZapine (ZYPREXA) 10 MG tablet Take 1 tablet (10 mg total) by mouth at bedtime.  . polyethylene glycol (MIRALAX / GLYCOLAX) packet Take 17 g by mouth daily as needed.  Marland Kitchen QUEtiapine (SEROQUEL) 200 MG tablet Take 200 mg by mouth at bedtime.  . risperiDONE (RISPERDAL) 1 MG tablet Take 1 tablet (1 mg total) by mouth 2 (two) times daily.  . traZODone (DESYREL) 100 MG tablet Take 2 tablets (200 mg total) by mouth at bedtime. For insomnia.  Marland Kitchen albuterol (PROVENTIL HFA;VENTOLIN HFA) 108 (90 BASE) MCG/ACT inhaler Inhale 2 puffs into the lungs every 6 (six) hours as needed for wheezing or shortness of breath.   No facility-administered encounter medications on file as of 10/23/2017.      Patient Active Problem List   Diagnosis Date Noted  . Cocaine abuse with cocaine-induced mood disorder ([place]) 05/18/2017  . Schizoaffective disorder, bipolar type ([place])   . Acute renal failure (ARF) ([place])   . Suicidal ideations   . Chronic 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 coma ([place])   . AKI (acute kidney injury) ([place]) 02/19/2017  . Rhabdomyolysis 02/19/2017  . Suicidal  overdose ([place]) 02/19/2017  . Acute hepatitis 02/19/2017  . Elevated LFTs   . Homicidal ideations   . Schizoaffective disorder, depressive type ([place]) 09/05/2011  . 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 Degenerative disc disease 08/21/2011  . HIV disease ([place]) 08/17/2011     Health Maintenance Due  Topic Date Due  . TETANUS/TDAP  08/01/1984  . COLONOSCOPY  08/02/2015  . INFLUENZA VACCINE  08/09/2017     Review of Systems  Physical Exam   BP 124/77   Pulse 84   Temp 97.9 F (36.6 C)   Ht 6\' 2"  (1.88 m)   Wt 224 lb (101.6 kg)   BMI 28.76 kg/m   Physical Exam  Constitutional:  oriented to person, place, and time. appears well-developed and well-nourished. No distress.  HENT: [place]/AT, PERRLA, no scleral icterus Mouth/Throat: Oropharynx is clear and moist. No oropharyngeal exudate.  Cardiovascular: Normal rate, regular rhythm and normal heart sounds. Exam reveals no gallop and no friction rub.  No murmur heard.  Pulmonary/Chest: Effort normal and breath sounds normal. No respiratory distress.  has no wheezes.  Neck = supple, no nuchal rigidity Abdominal: Soft. Bowel sounds are normal.  exhibits no distension. There is no tenderness.  Lymphadenopathy: no cervical adenopathy. No axillary adenopathy Neurological: alert and oriented to person, place, and time.  Skin: Skin is warm and dry. No rash noted. No erythema.  Psychiatric: a normal mood and affect.  behavior is normal.   Lab Results  Component Value Date   CD4TCELL 32 (L) 07/24/2017   Lab Results  Component Value Date   CD4TABS 700 07/24/2017   CD4TABS 600 02/08/2012   CD4TABS 500 11/09/2011   Lab Results  Component Value Date   HIV1RNAQUANT <20 NOT DETECTED 07/24/2017   Lab Results  Component Value Date   HEPBSAB NON-REACTIVE 07/24/2017   Lab Results  Component Value Date   LABRPR NON REAC 08/18/2011    CBC Lab Results  Component Value Date   WBC 6.4 05/17/2017    RBC 3.90 (L) 05/17/2017   HGB 12.3 (L) 05/17/2017   HCT 36.9 (L) 05/17/2017   PLT 226 05/17/2017   MCV 94.6 05/17/2017   MCH 31.5 05/17/2017   MCHC 33.3 05/17/2017   RDW 14.7 05/17/2017   LYMPHSABS 2.5 03/24/2017   MONOABS 0.7 03/24/2017   EOSABS 0.2 03/24/2017    BMET Lab Results  Component Value Date   NA 138 08/13/2017   K 4.1 08/13/2017   CL 103 08/13/2017   CO2 28 08/13/2017   GLUCOSE 116 (H) 08/13/2017   BUN 18 08/13/2017   CREATININE 1.33 08/13/2017   CALCIUM 9.9 08/13/2017   GFRNONAA >60 05/17/2017   GFRAA >60 05/17/2017      Assessment and Plan hiv disease= will check labs today. Continue on biktarvy  Health maintenance = will get Flu today  ?fatty liver hx = will check labs

## 2017-10-25 LAB — COMPLETE METABOLIC PANEL WITH GFR
AG Ratio: 1.4 (calc) (ref 1.0–2.5)
ALKALINE PHOSPHATASE (APISO): 76 U/L (ref 40–115)
ALT: 23 U/L (ref 9–46)
AST: 24 U/L (ref 10–35)
Albumin: 4.3 g/dL (ref 3.6–5.1)
BUN: 14 mg/dL (ref 7–25)
CALCIUM: 9.9 mg/dL (ref 8.6–10.3)
CHLORIDE: 103 mmol/L (ref 98–110)
CO2: 27 mmol/L (ref 20–32)
Creat: 1.15 mg/dL (ref 0.70–1.33)
GFR, Est African American: 84 mL/min/{1.73_m2} (ref >=60)
GFR, Est Non African American: 73 mL/min/{1.73_m2} (ref >=60)
GLOBULIN: 3.1 g/dL (ref 1.9–3.7)
Glucose, Bld: 94 mg/dL (ref 65–99)
Potassium: 4.2 mmol/L (ref 3.5–5.3)
Sodium: 137 mmol/L (ref 135–146)
Total Bilirubin: 0.4 mg/dL (ref 0.2–1.2)
Total Protein: 7.4 g/dL (ref 6.1–8.1)

## 2017-10-25 LAB — CBC WITH DIFFERENTIAL/PLATELET
BASOS PCT: 0.5 %
Basophils Absolute: 39 cells/uL (ref 0–200)
EOS PCT: 5.1 %
Eosinophils Absolute: 393 cells/uL (ref 15–500)
HEMATOCRIT: 40.9 % (ref 38.5–50.0)
Hemoglobin: 14.2 g/dL (ref 13.2–17.1)
LYMPHS ABS: 3072 {cells}/uL (ref 850–3900)
MCH: 31.3 pg (ref 27.0–33.0)
MCHC: 34.7 g/dL (ref 32.0–36.0)
MCV: 90.3 fL (ref 80.0–100.0)
MPV: 11.7 fL (ref 7.5–12.5)
Monocytes Relative: 8.8 %
NEUTROS ABS: 3519 {cells}/uL (ref 1500–7800)
NEUTROS PCT: 45.7 %
PLATELETS: 207 10*3/uL (ref 140–400)
RBC: 4.53 10*6/uL (ref 4.20–5.80)
RDW: 14 % (ref 11.0–15.0)
Total Lymphocyte: 39.9 %
WBC mixed population: 678 cells/uL (ref 200–950)
WBC: 7.7 10*3/uL (ref 3.8–10.8)

## 2017-10-25 LAB — T-HELPER CELL (CD4) - (RCID CLINIC ONLY)
CD4 T CELL HELPER: 35 % (ref 33–55)
CD4 T Cell Abs: 1100 /uL (ref 400–2700)

## 2017-10-25 LAB — HIV-1 RNA QUANT-NO REFLEX-BLD
HIV 1 RNA Quant: <20 copies/mL — AB
HIV-1 RNA Quant, Log: <1.3 Log copies/mL — AB

## 2018-01-15 ENCOUNTER — Ambulatory Visit: Payer: Self-pay | Admitting: Infectious Diseases

## 2018-01-23 ENCOUNTER — Ambulatory Visit (INDEPENDENT_AMBULATORY_CARE_PROVIDER_SITE_OTHER): Payer: Self-pay | Admitting: Internal Medicine

## 2018-01-23 VITALS — Wt 244.0 lb

## 2018-01-23 DIAGNOSIS — G588 Other specified mononeuropathies: Secondary | ICD-10-CM

## 2018-01-23 DIAGNOSIS — Z23 Encounter for immunization: Secondary | ICD-10-CM

## 2018-01-23 DIAGNOSIS — B20 Human immunodeficiency virus [HIV] disease: Secondary | ICD-10-CM

## 2018-01-23 MED ORDER — GABAPENTIN 300 MG PO CAPS: 300.0000 mg | ORAL_CAPSULE | Freq: Two times a day (BID) | ORAL | 3 refills | Status: DC

## 2018-01-23 NOTE — Progress Notes (Signed)
Patient ID: Dylan [name], male   DOB: [birth year], 53 y.o.   MRN: [id number]  HPI Dylan [name] is a 53yo m with hiv disease. Hx of depresssion. Doing well with hiv adherence to meds. Doing well except that he stepped on nail roughly 4 days ago. Prior to stepping on a nail, he mentions that he has had increasing neuropathy to both feet Outpatient Encounter Medications as of 01/23/2018  Medication Sig  . albuterol (PROVENTIL HFA;VENTOLIN HFA) 108 (90 BASE) MCG/ACT inhaler Inhale 2 puffs into the lungs every 6 (six) hours as needed for wheezing or shortness of breath.  Marland Kitchen amLODipine (NORVASC) 10 MG tablet Take 1 tablet (10 mg total) by mouth at bedtime.  Marland Kitchen BIKTARVY 50-200-25 MG TABS tablet TAKE 1 TABLET BY MOUTH DAILY  . haloperidol (HALDOL) 10 MG tablet Take 10 mg by mouth at bedtime.  . haloperidol (HALDOL) 5 MG tablet Take 5 mg by mouth 4 (four) times daily.  . hydrOXYzine (ATARAX/VISTARIL) 25 MG tablet Take 25 mg by mouth 3 (three) times daily as needed.  Marland Kitchen OLANZapine (ZYPREXA) 10 MG tablet Take 1 tablet (10 mg total) by mouth at bedtime.  . polyethylene glycol (MIRALAX / GLYCOLAX) packet Take 17 g by mouth daily as needed.  Marland Kitchen QUEtiapine (SEROQUEL) 200 MG tablet Take 200 mg by mouth at bedtime.  . risperiDONE (RISPERDAL) 1 MG tablet Take 1 tablet (1 mg total) by mouth 2 (two) times daily.  . traZODone (DESYREL) 100 MG tablet Take 2 tablets (200 mg total) by mouth at bedtime. For insomnia.   No facility-administered encounter medications on file as of 01/23/2018.      Patient Active Problem List   Diagnosis Date Noted  . Cocaine abuse with cocaine-induced mood disorder ([place]) 05/18/2017  . Schizoaffective disorder, bipolar type ([place])   . Acute renal failure (ARF) ([place])   . Suicidal ideations   . Chronic 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 coma ([place])   . AKI (acute kidney injury) ([place]) 02/19/2017  . Rhabdomyolysis 02/19/2017  . Suicidal overdose ([place]) 02/19/2017  . Acute  hepatitis 02/19/2017  . Elevated LFTs   . Homicidal ideations   . Schizoaffective disorder, depressive type ([place]) 09/05/2011  . 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 Degenerative disc disease 08/21/2011  . HIV disease ([place]) 08/17/2011     Health Maintenance Due  Topic Date Due  . TETANUS/TDAP  08/01/1984  . COLONOSCOPY  08/02/2015     Review of Systems Review of Systems  Constitutional: Negative for fever, chills, diaphoresis, activity change, appetite change, fatigue and unexpected weight change.  HENT: Negative for congestion, sore throat, rhinorrhea, sneezing, trouble swallowing and sinus pressure.  Eyes: Negative for photophobia and visual disturbance.  Respiratory: Negative for cough, chest tightness, shortness of breath, wheezing and stridor.  Cardiovascular: Negative for chest pain, palpitations and leg swelling.  Gastrointestinal: Negative for nausea, vomiting, abdominal pain, diarrhea, constipation, blood in stool, abdominal distention and anal bleeding.  Genitourinary: Negative for dysuria, hematuria, flank pain and difficulty urinating.  Musculoskeletal: Negative for myalgias, back pain, joint swelling, arthralgias and gait problem.  Skin: Negative for color change, pallor, rash and wound.  Neurological: Negative for dizziness, tremors, weakness and light-headedness.  Hematological: Negative for adenopathy. Does not bruise/bleed easily.  Psychiatric/Behavioral: Negative for behavioral problems, confusion, sleep disturbance, dysphoric mood, decreased concentration and agitation.    Physical Exam   Wt 244 lb (110.7 kg)   BMI 31.33 kg/m  Physical Exam  Constitutional: He is oriented to person, place, and time. He appears well-developed and well-nourished. No distress.  HENT:  Mouth/Throat: Oropharynx is clear and moist. No oropharyngeal exudate.  Cardiovascular: Normal rate, regular rhythm and normal heart sounds. Exam reveals no  gallop and no friction rub.  No murmur heard.  Pulmonary/Chest: Effort normal and breath sounds normal. No respiratory distress. He has no wheezes.  Abdominal: Soft. Bowel sounds are normal. He exhibits no distension. There is no tenderness.  Lymphadenopathy:  He has no cervical adenopathy.  Neurological: He is alert and oriented to person, place, and time.  Skin: Skin is warm and dry. No rash noted. No erythema. Unable to really see any puncture site on foot Psychiatric: He has a normal mood and affect. His behavior is normal.     Lab Results  Component Value Date   CD4TCELL 35 10/23/2017   Lab Results  Component Value Date   CD4TABS 1,100 10/23/2017   CD4TABS 700 07/24/2017   CD4TABS 600 02/08/2012   Lab Results  Component Value Date   HIV1RNAQUANT <20 DETECTED (A) 10/23/2017   Lab Results  Component Value Date   HEPBSAB NON-REACTIVE 07/24/2017   Lab Results  Component Value Date   LABRPR NON REAC 08/18/2011    CBC Lab Results  Component Value Date   WBC 7.7 10/23/2017   RBC 4.53 10/23/2017   HGB 14.2 10/23/2017   HCT 40.9 10/23/2017   PLT 207 10/23/2017   MCV 90.3 10/23/2017   MCH 31.3 10/23/2017   MCHC 34.7 10/23/2017   RDW 14.0 10/23/2017   LYMPHSABS 3,072 10/23/2017   MONOABS 0.7 03/24/2017   EOSABS 393 10/23/2017    BMET Lab Results  Component Value Date   NA 137 10/23/2017   K 4.2 10/23/2017   CL 103 10/23/2017   CO2 27 10/23/2017   GLUCOSE 94 10/23/2017   BUN 14 10/23/2017   CREATININE 1.15 10/23/2017   CALCIUM 9.9 10/23/2017   GFRNONAA 73 10/23/2017   GFRAA 84 10/23/2017      Assessment and Plan  Will give tdap given he was stepped on nail  hiv disease= well controlled. Continue on current regimen  Depression =  Continue on current meds from [facility]  Peripheral neuropathy = will check vitamin deficiency, rpr. As other causes for neuropathy and not assume is just due to hiv rtc in 3 months

## 2018-01-25 LAB — RPR: RPR Ser Ql: NONREACTIVE

## 2018-01-25 LAB — VITAMIN B12: Vitamin B-12: 522 pg/mL (ref 200–1100)

## 2018-01-25 LAB — VITAMIN B1: Vitamin B1 (Thiamine): <6 nmol/L — ABNORMAL LOW (ref 8–30)

## 2018-01-28 ENCOUNTER — Telehealth: Payer: Self-pay | Admitting: *Deleted

## 2018-01-28 DIAGNOSIS — E519 Thiamine deficiency, unspecified: Secondary | ICD-10-CM

## 2018-01-28 MED ORDER — THIAMINE HCL 50 MG PO TABS: 50.0000 mg | ORAL_TABLET | Freq: Every day | ORAL | 0 refills | Status: DC

## 2018-01-28 NOTE — Telephone Encounter (Signed)
Per Baxter Flattery request called the patient to let him know he is vit B 1 deficient and she wants him to take Thiamine 5 mg daily for 30 days. Patient request to send Rx to [facility]. He advised will have to call back to schedule the follow up lab.  Dose of Thiamine was changed after review of available medications per Baxter Flattery to 50 mg.

## 2018-01-28 NOTE — Telephone Encounter (Signed)
-----   Message from Carlyle Basques, MD sent at 01/25/2018  3:25 PM EST ----- Can you call him to let him know that he has a thiamine (vit B1) deficiency.   Can you send in rx for thiamine 5mg  daily x 30d and we see him back in 4 wk.

## 2018-02-21 ENCOUNTER — Ambulatory Visit (INDEPENDENT_AMBULATORY_CARE_PROVIDER_SITE_OTHER): Payer: Self-pay | Admitting: Internal Medicine

## 2018-02-21 ENCOUNTER — Ambulatory Visit (INDEPENDENT_AMBULATORY_CARE_PROVIDER_SITE_OTHER): Payer: Self-pay | Admitting: Licensed Clinical Social Worker

## 2018-02-21 ENCOUNTER — Encounter: Payer: Self-pay | Admitting: Internal Medicine

## 2018-02-21 DIAGNOSIS — F251 Schizoaffective disorder, depressive type: Secondary | ICD-10-CM

## 2018-02-21 DIAGNOSIS — B20 Human immunodeficiency virus [HIV] disease: Secondary | ICD-10-CM

## 2018-02-21 DIAGNOSIS — B181 Chronic viral hepatitis B without delta-agent: Secondary | ICD-10-CM

## 2018-02-21 MED ORDER — GABAPENTIN 300 MG PO CAPS: 300.0000 mg | ORAL_CAPSULE | Freq: Three times a day (TID) | ORAL | 3 refills | Status: DC

## 2018-02-21 NOTE — Progress Notes (Signed)
RFV: follow up for hiv disease  Patient ID: Dylan [name], male   DOB: Jun 06, 1965, 53 y.o.   MRN: [id number]  HPI Dylan [name] is a 53yo M with hx of schizoaffective disorder/bipolar, with well controlled hiv disease. He reports that he has had difficulty getting connected into [facility] due to construction? He is starting to feel paranoid and would like to see counseling in clinic as well. He is still maintaining to take his hiv medications daily.  Soc hx: no recent sexual activity  Outpatient Encounter Medications as of 02/21/2018  Medication Sig  . amLODipine (NORVASC) 10 MG tablet Take 1 tablet (10 mg total) by mouth at bedtime.  Marland Kitchen BIKTARVY 50-200-25 MG TABS tablet TAKE 1 TABLET BY MOUTH DAILY  . gabapentin (NEURONTIN) 300 MG capsule Take 1 capsule (300 mg total) by mouth 3 (three) times daily.  . haloperidol (HALDOL) 10 MG tablet Take 10 mg by mouth at bedtime.  . haloperidol (HALDOL) 5 MG tablet Take 5 mg by mouth 4 (four) times daily.  . hydrOXYzine (ATARAX/VISTARIL) 25 MG tablet Take 25 mg by mouth 3 (three) times daily as needed.  Marland Kitchen OLANZapine (ZYPREXA) 10 MG tablet Take 1 tablet (10 mg total) by mouth at bedtime.  . polyethylene glycol (MIRALAX / GLYCOLAX) packet Take 17 g by mouth daily as needed.  Marland Kitchen QUEtiapine (SEROQUEL) 200 MG tablet Take 200 mg by mouth at bedtime.  . risperiDONE (RISPERDAL) 1 MG tablet Take 1 tablet (1 mg total) by mouth 2 (two) times daily.  Marland Kitchen thiamine 50 MG tablet Take 1 tablet (50 mg total) by mouth daily.  . traZODone (DESYREL) 100 MG tablet Take 2 tablets (200 mg total) by mouth at bedtime. For insomnia.  . [DISCONTINUED] gabapentin (NEURONTIN) 300 MG capsule Take 1 capsule (300 mg total) by mouth 2 (two) times daily.  Marland Kitchen albuterol (PROVENTIL HFA;VENTOLIN HFA) 108 (90 BASE) MCG/ACT inhaler Inhale 2 puffs into the lungs every 6 (six) hours as needed for wheezing or shortness of breath.   No facility-administered encounter medications on file as of 02/21/2018.       Patient Active Problem List   Diagnosis Date Noted  . Cocaine abuse with cocaine-induced mood disorder ([place]) 05/18/2017  . Schizoaffective disorder, bipolar type ([place])   . Acute renal failure (ARF) ([place])   . Suicidal ideations   . Chronic 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 coma ([place])   . AKI (acute kidney injury) ([place]) 02/19/2017  . Rhabdomyolysis 02/19/2017  . Suicidal overdose ([place]) 02/19/2017  . Acute hepatitis 02/19/2017  . Elevated LFTs   . Homicidal ideations   . Schizoaffective disorder, depressive type ([place]) 09/05/2011  . Cocaine abuse with cocaine-induced psychotic disorder ([place]) 09/05/2011  . Degenerative disc disease 08/21/2011  . HIV disease ([place]) 08/17/2011     Health Maintenance Due  Topic Date Due  . COLONOSCOPY  08/02/2015     Review of Systems 12 point ros is negative, is otherwise negative. + paranoia + increasing numbness to feet.  Physical Exam   BP 133/87   Pulse (!) 105   Temp 98.3 F (36.8 C)   Wt 225 lb (102.1 kg)   BMI 28.89 kg/m   Physical Exam  Constitutional: He is oriented to person, place, and time. He appears well-developed and well-nourished. No distress.  HENT:  Mouth/Throat: Oropharynx is clear and moist. No oropharyngeal exudate.  Cardiovascular: Normal rate, regular rhythm and normal heart sounds. Exam reveals no gallop and no friction rub.  No  murmur heard.  Pulmonary/Chest: Effort normal and breath sounds normal. No respiratory distress. He has no wheezes.   Lymphadenopathy:  He has no cervical adenopathy.  Neurological: He is alert and oriented to person, place, and time.  Skin: Skin is warm and dry. No rash noted. No erythema.  Psychiatric: He is guarded during this visit   Lab Results  Component Value Date   CD4TCELL 35 10/23/2017   Lab Results  Component Value Date   CD4TABS 1,100 10/23/2017   CD4TABS 700 07/24/2017   CD4TABS 600 02/08/2012   Lab Results  Component Value Date   HIV1RNAQUANT  <20 DETECTED (A) 10/23/2017   Lab Results  Component Value Date   HEPBSAB NON-REACTIVE 07/24/2017   Lab Results  Component Value Date   LABRPR NON-REACTIVE 01/23/2018    CBC Lab Results  Component Value Date   WBC 7.7 10/23/2017   RBC 4.53 10/23/2017   HGB 14.2 10/23/2017   HCT 40.9 10/23/2017   PLT 207 10/23/2017   MCV 90.3 10/23/2017   MCH 31.3 10/23/2017   MCHC 34.7 10/23/2017   RDW 14.0 10/23/2017   LYMPHSABS 3,072 10/23/2017   MONOABS 0.7 03/24/2017   EOSABS 393 10/23/2017    BMET Lab Results  Component Value Date   NA 137 10/23/2017   K 4.2 10/23/2017   CL 103 10/23/2017   CO2 27 10/23/2017   GLUCOSE 94 10/23/2017   BUN 14 10/23/2017   CREATININE 1.15 10/23/2017   CALCIUM 9.9 10/23/2017   GFRNONAA 73 10/23/2017   GFRAA 84 10/23/2017      Assessment and Plan  hiv - doing well  Paranoia = seeing regina. Also need ot see how to get back into seeing [facility] quickly  Neuropathy = increasing gabapentin

## 2018-02-21 NOTE — BH Specialist Note (Signed)
Integrated Behavioral Health Initial Visit  MRN: [id number] Name: Dylan [name]  Number of [place] Clinician visits:: 1/6 Session Start time:  3:10pm  Session End time: 3:28pm Total time: 15 minutes  Type of Service: [place] Interpretor:No. Interpretor Name and Language: n/a   Warm Hand Off Completed.       SUBJECTIVE: Dylan [name] is a 53 y.o. male accompanied by self Patient was referred by Dr. Baxter Flattery for depression and psychosis. Patient reports the following symptoms/concerns: depressed mood, flat affect, low energy and motivation, irritability, hearing voices, anxiety/paranoia Duration of problem: 1 month; Severity of problem: moderate  OBJECTIVE: Mood: Depressed and Affect: Depressed Risk of harm to self or others: No plan to harm self or others; history of plan/attempt, but none currently  LIFE CONTEXT: Patient had been seeing mental health professionals at [facility], and receiving Haldol shots there. For the past month or so he has not been able to see counselor there and has trouble getting there/going in due to all the construction going on. Patient reports he feels the Haldol working somewhat, but he still is paranoid.  GOALS ADDRESSED: Patient will: 1. Reduce symptoms of: depression  INTERVENTIONS: Interventions utilized: Motivational Interviewing and Supportive Counseling  ASSESSMENT: Patient currently experiencing  depressed mood, flat affect, low energy and motivation, irritability, hearing voices, anxiety/paranoia and mood instability. He reports a history of psychosis along with his depressed moods. A history of Schizoaffective Disorder, Depressive Type is present in his file. This is the most accurate diagnosis at this time. Counselor will continue to assess for appropriateness.   Counselor educated patient on counseling services available at [facility], including scope of practice, scheduling, and  financial accessibility. Patient and counselor discussed how counseling might be helpful for patient. Counselor guided patient to identify goals for his sessions. Patient would like to be less anxious and more able to function in the world. He would also like to feel better about himself and have more energy. Counselor and patient explored patient's supports and strengths that can help him move toward these goals.   Patient may benefit from CBT and Reality Therapy sessions weekly.  PLAN: 1. Follow up with behavioral health clinician on : 03/05/18  Lillie Fragmin, LCSW

## 2018-03-03 DIAGNOSIS — N186 End stage renal disease: Secondary | ICD-10-CM | POA: Insufficient documentation

## 2018-03-03 DIAGNOSIS — Z992 Dependence on renal dialysis: Secondary | ICD-10-CM

## 2018-03-05 ENCOUNTER — Institutional Professional Consult (permissible substitution): Payer: Self-pay | Admitting: Licensed Clinical Social Worker

## 2018-03-05 ENCOUNTER — Encounter: Payer: Self-pay | Admitting: Internal Medicine

## 2018-03-12 ENCOUNTER — Telehealth: Payer: Self-pay | Admitting: *Deleted

## 2018-03-12 NOTE — Telephone Encounter (Signed)
Lanelle Bal called from [facility] while with patient, asking if his missed counseling appointment could be moved to just before his appointmetn with Dr Baxter Flattery 3/31. Please contact patient/Natalie and reschedule if this is inappropriate. Landis Gandy, RN

## 2018-03-14 NOTE — Telephone Encounter (Signed)
That's actually perfect. Thanks!

## 2018-04-05 ENCOUNTER — Other Ambulatory Visit: Payer: Self-pay | Admitting: Internal Medicine

## 2018-04-05 DIAGNOSIS — B20 Human immunodeficiency virus [HIV] disease: Secondary | ICD-10-CM

## 2018-04-09 ENCOUNTER — Ambulatory Visit: Payer: Self-pay | Admitting: Licensed Clinical Social Worker

## 2018-04-09 ENCOUNTER — Ambulatory Visit: Payer: Self-pay | Admitting: Internal Medicine

## 2018-04-10 ENCOUNTER — Other Ambulatory Visit: Payer: Self-pay

## 2018-04-10 ENCOUNTER — Ambulatory Visit (INDEPENDENT_AMBULATORY_CARE_PROVIDER_SITE_OTHER): Payer: Self-pay | Admitting: Licensed Clinical Social Worker

## 2018-04-10 DIAGNOSIS — F251 Schizoaffective disorder, depressive type: Secondary | ICD-10-CM

## 2018-04-10 NOTE — Progress Notes (Signed)
Integrated Behavioral Health Visit via Telemedicine (Telephone)  04/10/2018 Dylan [name] [id number]   Session Start time: 2:10pm  Session End time: 2:40pm Total time: 30 minutes  Type of Visit: Telephonic Patient location: Patient's home [facility] Provider location: [facility] All persons participating in visit: Patient and Counselor  Confirmed patient's address: Yes  Confirmed patient's phone number: Yes  Any changes to demographics: No   Confirmed patient's insurance: Yes  Any changes to patient's insurance: Yes. Patient was just reapproved for disability and will get his Medicaid card soon  Discussed confidentiality: Yes    The following statements were read to the patient and/or legal guardian that are established with the [facility] Provider.  "The purpose of this phone visit is to provide behavioral health care while limiting exposure to the coronavirus (COVID19).  There is a possibility of technology failure and discussed alternative modes of communication if that failure occurs."  "By engaging in this telephone visit, you consent to the provision of healthcare.  Additionally, you authorize for your insurance to be billed for the services provided during this telephone visit."   Patient and/or legal guardian consented to telephone visit: Yes   PRESENTING CONCERNS: Patient and/or family reports the following symptoms/concerns: paranoia, possible auditory hallucinations, inability to sleep, very high anxiety Duration of problem: ; Severity of problem: moderate  STRENGTHS (Protective Factors/Coping Skills): Family support (brother), some insight into illness, desire for progress, motivation to be involved in care  GOALS ADDRESSED: Patient will: 1.  Reduce symptoms of: anxiety and and paranoia   INTERVENTIONS: Interventions utilized:  Solution-Focused Strategies, Mindfulness or Relaxation Training and Supportive Counseling  ASSESSMENT: Patient currently experiencing  paranoia, insomnia, and increased anxiety. He also reports that he sometimes thinks he hears a crowd outside his home, but is too afraid to look through the window and see if anyone is there. Patient believes this is probably a function of his paranoid thoughts, but is not sure. Counselor commended patient for this insight, and assisted him in problem-solving for this thought. Patient indicates that after this session he hopes to feel better to the extent that he can go for a walk outside for the exercise and fresh air, but maybe that will also prove to him that there is no crowd there. Counselor guided patient to explore his other symptoms. Patient indicates that he is not sleeping much at all, as he has trouble getting his mind relaxed enough to fall asleep. Even when he can, patient reports that he wakes up afraid and worrying throughout the night. Counselor and patient explored ways that lack of sleep can impact mood and psychosis. Counselor educated patient on ways to soothe the mind before trying to sleep, including breathwork, yoga nidra, and sensory focused guided thoughts. Patient and counselor processed ways to address paranoia during the day, including reality checking with brother when he calls each day, writing down and (later) refuting paranoid thoughts. Patient was able to practice focused breathing at the end of the session and stated he was going to take his walk now that he was calmer.   Patient may benefit from continued sessions every other week, to include relaxation training as needed.  PLAN: 1. Follow up with behavioral health clinician on : 05/06/18 at [facility]

## 2018-04-11 ENCOUNTER — Other Ambulatory Visit: Payer: Self-pay

## 2018-04-11 ENCOUNTER — Ambulatory Visit (INDEPENDENT_AMBULATORY_CARE_PROVIDER_SITE_OTHER): Payer: Medicaid Other

## 2018-04-11 ENCOUNTER — Encounter (HOSPITAL_COMMUNITY): Payer: Self-pay

## 2018-04-11 ENCOUNTER — Ambulatory Visit (HOSPITAL_COMMUNITY)
Admission: EM | Admit: 2018-04-11 | Discharge: 2018-04-11 | Disposition: A | Discharge status: Home / Self Care | Payer: Medicaid Other | Attending: Family Medicine | Admitting: Family Medicine

## 2018-04-11 DIAGNOSIS — S99921A Unspecified injury of right foot, initial encounter: Secondary | ICD-10-CM | POA: Diagnosis not present

## 2018-04-11 DIAGNOSIS — S9031XA Contusion of right foot, initial encounter: Secondary | ICD-10-CM | POA: Diagnosis not present

## 2018-04-11 DIAGNOSIS — W19XXXA Unspecified fall, initial encounter: Secondary | ICD-10-CM | POA: Diagnosis not present

## 2018-04-11 DIAGNOSIS — W11XXXA Fall on and from ladder, initial encounter: Secondary | ICD-10-CM

## 2018-04-11 DIAGNOSIS — S92321A Displaced fracture of second metatarsal bone, right foot, initial encounter for closed fracture: Secondary | ICD-10-CM

## 2018-04-11 NOTE — ED Triage Notes (Signed)
Patient presents to Urgent Care with complaints of right foot pain since sliding down a ladder this afternoon while at work, pt states he was on the second floor and fell all the way to the wooden floor below. Patient states he will be filing for worker's comp.

## 2018-04-11 NOTE — ED Provider Notes (Signed)
[facility]   [id number] 04/11/18 Arrival Time: 1607  ASSESSMENT & PLAN:  1. Contusion of right foot, initial encounter   2. Closed fracture of second metatarsal bone of right foot, initial encounter    I have personally viewed the imaging studies ordered this visit. Suspect proximal 2nd metatarsal fracture.  Orders Placed This Encounter  Procedures  . DG Foot Complete Right  . Crutches  . Apply cam walker   Prefers OTC analgesics as needed. Work note with restrictions provided.   Follow-up Information    Schedule an appointment as soon as possible for a visit  with [facility].   Specialty:  Specialist Contact information: [address] [name] [address] [phone]          Reviewed expectations re: course of current medical issues. Questions answered. Outlined signs and symptoms indicating need for more acute intervention. Patient verbalized understanding. After Visit Summary given.  SUBJECTIVE: History from: patient. Dylan [name] is a 53 y.o. male who reports standing on a ladder approx two stories up; ladder slid down to the ground as he was hanging on; noticed immediate and fairly persistent R foot pain; able to bear weight but with pain. No extremity sensation changes or weakness. No swelling noted. No OTC analgesics taken. No specific alleviating factors reported. No h/o R foot injury. No back pain reported.  Past Surgical History:  Procedure Laterality Date  . IR FLUORO GUIDE CV LINE RIGHT  02/22/2017  . IR US GUIDE VASC ACCESS RIGHT  02/22/2017  . TUMOR REMOVAL     From Chest     ROS: As per HPI. All other systems negative.   OBJECTIVE:  Vitals:   04/11/18 1744  BP: 136/83  Pulse: 100  Resp: 17  Temp: 98.5 F (36.9 C)  TempSrc: Oral  SpO2: 96%    General appearance: alert; no distress HEENT: [place]; AT Neck: supple with FROM Lungs: unlabored respirations; CTAB Extremities: . RLE: warm and well perfused; poorly  localized moderate tenderness over right proximal and mid foot; without gross deformities; with mild swelling; with no bruising; ROM: normal at ankle with reported mild discomfort of proximal foot; no fifth metatarsal tenderness; no malleolar tenderness CV: brisk extremity capillary refill of RLE; 2+ DP and PT pulse of RLE. Back: no midline spinal tenderness Skin: warm and dry; no visible rashes Neurologic: unable to assess gait secondary to foot injury (in wheelchair); normal reflexes of RLE and LLE; normal sensation of RLE and LLE; normal strength of RLE and LLE Psychological: alert and cooperative; normal mood and affect  Imaging: Dg Foot Complete Right  Result Date: 04/11/2018 CLINICAL DATA:  [place]. Pain. EXAM: RIGHT FOOT COMPLETE - 3+ VIEW COMPARISON:  None. FINDINGS: There is a possible base second metatarsal fracture, seen best on the oblique view. No talus or calcaneus fracture can be seen. No other metatarsal fracture is seen. IMPRESSION: Possible second metatarsal base fracture.  See arrow. Electronically Signed   By: Staci Righter M.D.   On: 04/11/2018 18:35     Allergies  Allergen Reactions  . Penicillins Other (See Comments)    Childhood allergy  . Latex Rash  . Tape Rash    Past Medical History:  Diagnosis Date  . Arthritis   . Bipolar 1 disorder ([place])   . Depression   . Hepatitis B   . Human immunodeficiency virus (HIV) ([place])   . Schizophrenia [facility])    Social History   Socioeconomic History  . Marital  status: Married    Spouse name: Not on file  . Number of children: Not on file  . Years of education: Not on file  . Highest education level: Not on file  Occupational History  . Not on file  Social Needs  . Financial resource strain: Not on file  . Food insecurity:    Worry: Not on file    Inability: Not on file  . Transportation needs:    Medical: Not on file    Non-medical: Not on file  Tobacco Use  . Smoking status: Current Every Day Smoker     Packs/day: 0.50    Years: 15.00    Pack years: 7.50    Types: Cigarettes  . Smokeless tobacco: Current User  Substance and Sexual Activity  . Alcohol use: No    Comment: past  . Drug use: Yes    Types: Cocaine    Comment: past  . Sexual activity: Yes    Comment: condoms given  Lifestyle  . Physical activity:    Days per week: Not on file    Minutes per session: Not on file  . Stress: Not on file  Relationships  . Social connections:    Talks on phone: Not on file    Gets together: Not on file    Attends religious service: Not on file    Active member of club or organization: Not on file    Attends meetings of clubs or organizations: Not on file    Relationship status: Not on file  Other Topics Concern  . Not on file  Social History Narrative  . Not on file   FH: Question of HTN  Past Surgical History:  Procedure Laterality Date  . IR FLUORO GUIDE CV LINE RIGHT  02/22/2017  . IR US GUIDE VASC ACCESS RIGHT  02/22/2017  . TUMOR REMOVAL     From Chest      Vanessa Kick, MD 04/16/18 [phone]

## 2018-04-30 ENCOUNTER — Other Ambulatory Visit: Payer: Self-pay | Admitting: Internal Medicine

## 2018-04-30 DIAGNOSIS — B20 Human immunodeficiency virus [HIV] disease: Secondary | ICD-10-CM

## 2018-05-06 ENCOUNTER — Ambulatory Visit: Payer: Self-pay | Admitting: Licensed Clinical Social Worker

## 2018-05-15 ENCOUNTER — Ambulatory Visit (INDEPENDENT_AMBULATORY_CARE_PROVIDER_SITE_OTHER): Payer: Medicaid Other | Admitting: Family Medicine

## 2018-05-15 ENCOUNTER — Ambulatory Visit (INDEPENDENT_AMBULATORY_CARE_PROVIDER_SITE_OTHER): Payer: Medicaid Other

## 2018-05-15 ENCOUNTER — Encounter: Payer: Self-pay | Admitting: Family Medicine

## 2018-05-15 ENCOUNTER — Other Ambulatory Visit: Payer: Self-pay

## 2018-05-15 DIAGNOSIS — M79671 Pain in right foot: Secondary | ICD-10-CM

## 2018-05-15 NOTE — Progress Notes (Signed)
Office Visit Note   Patient: Dylan [name]           Date of Birth: Mar 19, 1965           MRN: [id number] Visit Date: 05/15/2018 Requested by: Marliss Coots, NP [place], [place] [address] PCP: Marliss Coots, NP  Subjective: Chief Complaint  Patient presents with  . Right Foot - Pain, Injury    DOI 04/11/2018 [place] 2 stories - was on a ladder putting in a window. When the holder of the ladder walked away, the ladder and the patient fell. Went to [facility], in cam walker, PWB.    HPI: He is a 53 year old with right foot pain.  On April 2 he was up on a ladder, but bottom and of the ladder slid outward in the top and came down so that he landed prone on the ground.  He felt immediate pain in his right foot, unable to bear weight.  He went to the ER where x-rays showed a possible fracture of the proximal second metatarsal.  He was placed in a fracture boot and now presents for follow-up.  Pain is better but still not gone.  Complains of pain across the dorsal lateral aspect of his foot and on the heel.              ROS: He has multiple health issues listed in his chart.  All other systems were reviewed and are negative.  Objective: Vital Signs: There were no vitals taken for this visit.  Physical Exam:  General:  Alert and oriented, in no acute distress. Pulm:  Breathing unlabored. Psy:  Normal mood, congruent affect. Skin: No skin breakdown. Right foot: No tenderness around the ankle.  There is some tenderness on the plantar aspect of his heel.  He is also tender on the dorsal lateral foot diffusely near the third through fifth proximal metatarsals.  There is no point tenderness at any area, but he is most tender near the proximal fifth metatarsal.  Imaging: X-rays right foot: The previously seen avulsion fragment at the base of the second metatarsal appears to be gone/healed.  There is persistently widened Lisfranc joint, but did not do standing bilateral AP views for  comparison since he does not have significant pain there.  No other acute abnormality seen, no fracture at the proximal fifth metatarsal.    Assessment & Plan: 1.  1 month status post fall with improved but persistent right foot pain, probably due to sprain injury.  Cannot rule out injury to the Lisfranc joint. -We will wean from fracture boot and start doing Thera-Band exercises for strengthening.  If after 2 to 3 weeks he is not noticing significant improvement he will call me and we will order MRI of his foot.     Procedures: No procedures performed  No notes on file     PMFS History: Patient Active Problem List   Diagnosis Date Noted  . ESRD needing dialysis ([place]) 03/03/2018  . Cocaine abuse with cocaine-induced mood disorder ([place]) 05/18/2017  . Schizoaffective disorder, bipolar type ([place])   . Acute renal failure (ARF) ([place])   . Suicidal ideations   . Chronic 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 coma ([place])   . AKI (acute kidney injury) ([place]) 02/19/2017  . Rhabdomyolysis 02/19/2017  . Suicidal overdose ([place]) 02/19/2017  . Acute hepatitis 02/19/2017  . Elevated LFTs   . Homicidal ideations   . Schizoaffective disorder, depressive type (  [place]) 09/05/2011  . Cocaine abuse with cocaine-induced psychotic disorder ([place]) 09/05/2011  . Degenerative disc disease 08/21/2011  . HIV disease ([place]) 08/17/2011   Past Medical History:  Diagnosis Date  . Arthritis   . Bipolar 1 disorder ([place])   . Depression   . Hepatitis B   . Human immunodeficiency virus (HIV) ([place])   . Schizophrenia ([place])     History reviewed. No pertinent family history.  Past Surgical History:  Procedure Laterality Date  . IR FLUORO GUIDE CV LINE RIGHT  02/22/2017  . IR US GUIDE VASC ACCESS RIGHT  02/22/2017  . TUMOR REMOVAL     From Chest   Social History   Occupational History  . Not on file  Tobacco Use  . Smoking status: Current Every Day Smoker    Packs/day: 0.50    Years: 15.00     Pack years: 7.50    Types: Cigarettes  . Smokeless tobacco: Current User  Substance and Sexual Activity  . Alcohol use: No    Comment: past  . Drug use: Yes    Types: Cocaine    Comment: past  . Sexual activity: Yes    Comment: condoms given

## 2018-05-27 ENCOUNTER — Other Ambulatory Visit: Payer: Self-pay

## 2018-05-27 ENCOUNTER — Other Ambulatory Visit: Payer: Self-pay | Admitting: Internal Medicine

## 2018-05-27 ENCOUNTER — Encounter: Payer: Self-pay | Admitting: Family Medicine

## 2018-05-27 ENCOUNTER — Ambulatory Visit: Payer: Medicaid Other | Attending: Family Medicine | Admitting: Family Medicine

## 2018-05-27 DIAGNOSIS — B20 Human immunodeficiency virus [HIV] disease: Secondary | ICD-10-CM | POA: Diagnosis not present

## 2018-05-27 DIAGNOSIS — S92324D Nondisplaced fracture of second metatarsal bone, right foot, subsequent encounter for fracture with routine healing: Secondary | ICD-10-CM

## 2018-05-27 DIAGNOSIS — F250 Schizoaffective disorder, bipolar type: Secondary | ICD-10-CM | POA: Diagnosis not present

## 2018-05-27 NOTE — Progress Notes (Signed)
Virtual Visit via Telephone Note  I connected with Dylan [name], on 05/27/2018 at 10:48 AM by telephone due to the COVID-19 pandemic and verified that I am speaking with the correct person using two identifiers.   Consent: I discussed the limitations, risks, security and privacy concerns of performing an evaluation and management service by telephone and the availability of in person appointments. I also discussed with the patient that there may be a patient responsible charge related to this service. The patient expressed understanding and agreed to proceed.   Location of Patient: Work  Biomedical scientist of Provider: Clinic   Persons participating in Telemedicine visit: Dylan [name] Dr. Felecia Shelling     History of Present Illness: [name] is a 53 year old male with a history of HIV, hypertension previously followed by Marliss Coots at the [facility] seen today to establish care. 6 weeks ago he was seen at the urgent care center for close fracture of second metatarsal bone of his right foot which occurred after the ladder he was standing on slipped with resulting right foot pain and inability to bear weight. He was last seen by his orthopedic-Dr. hilts on 05/15/2018 at which time imaging revealed no fracture and plan was to wean him off the fracture boot, commenced therapy and exercises for strengthening and MRI of the right foot if symptoms persist.  Today he informs me pain is a 4/10.  He notices swelling and pain as the day progresses but when he soaks his foot at the end of the day in Epsom salt swelling resolves. With regards to his hypertension he no longer takes amlodipine and states his last blood pressure checked at the [facility] was 130/80. His HIV is managed by [facility]-Dr. Graylon Good and he endorses compliance with his medications. For his schizoaffective disorder he is currently not under the care of psychiatry but was previously followed by [facility]; he has been unable  to make it there due to construction but promises to do so.  He has remained compliant with his psychotropic medications  which have been prescribed by his previous PCP.  He also had a telemedicine visit with the LCSW from [place].   Past Medical History:  Diagnosis Date  . Arthritis   . Bipolar 1 disorder ([place])   . Depression   . Hepatitis B   . Human immunodeficiency virus (HIV) ([place])   . Schizophrenia ([place])    Allergies  Allergen Reactions  . Penicillins Other (See Comments)    Childhood allergy  . Latex Rash  . Tape Rash    Current Outpatient Medications on File Prior to Visit  Medication Sig Dispense Refill  . BIKTARVY 50-200-25 MG TABS tablet TAKE 1 TABLET BY MOUTH DAILY 30 tablet 0  . gabapentin (NEURONTIN) 300 MG capsule Take 1 capsule (300 mg total) by mouth 3 (three) times daily. 90 capsule 3  . hydrOXYzine (ATARAX/VISTARIL) 25 MG tablet Take 25 mg by mouth 3 (three) times daily as needed.    Marland Kitchen QUEtiapine (SEROQUEL) 200 MG tablet Take 200 mg by mouth at bedtime.    . thiamine 50 MG tablet Take 1 tablet (50 mg total) by mouth daily. 30 tablet 0  . traZODone (DESYREL) 100 MG tablet Take 2 tablets (200 mg total) by mouth at bedtime. For insomnia. 30 tablet 0  . albuterol (PROVENTIL HFA;VENTOLIN HFA) 108 (90 BASE) MCG/ACT inhaler Inhale 2 puffs into the lungs every 6 (six) hours as needed for wheezing or shortness of breath. 8.5 g  0  . amLODipine (NORVASC) 10 MG tablet Take 1 tablet (10 mg total) by mouth at bedtime. (Patient not taking: Reported on 05/27/2018) 30 tablet 0  . haloperidol (HALDOL) 10 MG tablet Take 10 mg by mouth at bedtime.    . haloperidol (HALDOL) 5 MG tablet Take 5 mg by mouth 4 (four) times daily.    Marland Kitchen OLANZapine (ZYPREXA) 10 MG tablet Take 1 tablet (10 mg total) by mouth at bedtime. (Patient not taking: Reported on 05/27/2018) 30 tablet 0  . polyethylene glycol (MIRALAX / GLYCOLAX) packet Take 17 g by mouth daily as needed. (Patient not taking: Reported on  05/27/2018) 10 each 0  . risperiDONE (RISPERDAL) 1 MG tablet Take 1 tablet (1 mg total) by mouth 2 (two) times daily. (Patient not taking: Reported on 05/27/2018) 30 tablet 0   No current facility-administered medications on file prior to visit.     Observations/Objective: Weak, alert, oriented x3 Not in acute distress  Assessment and Plan: 1. Schizoaffective disorder, bipolar type ([place]) Continue psychotropic medications Strongly encouraged to make an appointment with [facility]  2. HIV disease ([place]) Currently on antiretroviral therapy  3. Closed nondisplaced fracture of second metatarsal bone of right foot with routine healing, subsequent encounter Pain is minimal at this time Elevate feet as much as possible Continue strengthening exercises and keep appointment with orthopedics.   Follow Up Instructions: Return in about 3 months (around 08/27/2018).     I discussed the assessment and treatment plan with the patient. The patient was provided an opportunity to ask questions and all were answered. The patient agreed with the plan and demonstrated an understanding of the instructions.   The patient was advised to call back or seek an in-person evaluation if the symptoms worsen or if the condition fails to improve as anticipated.     I provided 20 minutes total of non-face-to-face time during this encounter including median intraservice time, reviewing previous notes, labs, imaging, medications and explaining diagnosis and management.     Charlott Rakes, MD, FAAFP. [facility] and [place], [place]   05/27/2018, 10:48 AM

## 2018-05-27 NOTE — Progress Notes (Signed)
Patient has been called and DOB has been verified. Patient has been screened and transferred to PCP to start phone visit.     

## 2018-06-01 ENCOUNTER — Encounter: Payer: Self-pay | Admitting: Family Medicine

## 2018-06-01 DIAGNOSIS — M79671 Pain in right foot: Secondary | ICD-10-CM

## 2018-06-03 ENCOUNTER — Other Ambulatory Visit: Payer: Self-pay

## 2018-06-04 ENCOUNTER — Telehealth: Payer: Self-pay | Admitting: *Deleted

## 2018-06-04 NOTE — Telephone Encounter (Signed)
Hi, Dylan [name]. We are open for visits Monday - Thursday until July.  You are due for labs and follow up with Dr. Baxter Flattery.  Please call [phone] to schedule or you can reply back with the best contact number to reach you. Thank you! Dylan [name] ===View-only below this line===   ----- Message -----    From: Dylan [name]    Sent: 06/03/2018  8:30 PM EDT      To: Patient Appointment Schedule Request Message List Subject: RE: Appointment Request  Appointment Request From: Dylan [name]  With Provider: Carlyle Basques, MD [facility] for Infectious Disease]  Preferred Date Range: 06/07/2018 - 06/07/2018  Preferred Times: Friday Afternoon  Reason for visit: Office Visit  Comments: Cd4 viral load thiamine B1 checked

## 2018-06-05 ENCOUNTER — Other Ambulatory Visit: Payer: Self-pay

## 2018-06-05 DIAGNOSIS — B20 Human immunodeficiency virus [HIV] disease: Secondary | ICD-10-CM

## 2018-06-05 DIAGNOSIS — Z113 Encounter for screening for infections with a predominantly sexual mode of transmission: Secondary | ICD-10-CM

## 2018-06-05 DIAGNOSIS — Z79899 Other long term (current) drug therapy: Secondary | ICD-10-CM

## 2018-06-05 NOTE — Telephone Encounter (Signed)
Patient returned call. LPN scheduled new appointment for labs and over due provider appointment. Eugenia Mcalpine, LPN

## 2018-06-06 ENCOUNTER — Other Ambulatory Visit (HOSPITAL_COMMUNITY)
Admission: RE | Admit: 2018-06-06 | Discharge: 2018-06-06 | Disposition: A | Discharge status: Home / Self Care | Payer: Medicaid Other | Source: Ambulatory Visit | Attending: Internal Medicine | Admitting: Internal Medicine

## 2018-06-06 ENCOUNTER — Other Ambulatory Visit: Payer: Self-pay

## 2018-06-06 ENCOUNTER — Other Ambulatory Visit: Payer: Self-pay | Admitting: *Deleted

## 2018-06-06 ENCOUNTER — Other Ambulatory Visit: Payer: Medicaid Other

## 2018-06-06 DIAGNOSIS — Z113 Encounter for screening for infections with a predominantly sexual mode of transmission: Secondary | ICD-10-CM | POA: Diagnosis present

## 2018-06-06 DIAGNOSIS — B20 Human immunodeficiency virus [HIV] disease: Secondary | ICD-10-CM

## 2018-06-06 DIAGNOSIS — Z79899 Other long term (current) drug therapy: Secondary | ICD-10-CM

## 2018-06-06 DIAGNOSIS — E519 Thiamine deficiency, unspecified: Secondary | ICD-10-CM

## 2018-06-07 LAB — URINE CYTOLOGY ANCILLARY ONLY
Chlamydia: NEGATIVE
Neisseria Gonorrhea: NEGATIVE

## 2018-06-07 LAB — T-HELPER CELL (CD4) - (RCID CLINIC ONLY)
CD4 % Helper T Cell: 34 % (ref 33–65)
CD4 T Cell Abs: 942 /uL (ref 400–1790)

## 2018-06-11 LAB — CBC WITH DIFFERENTIAL/PLATELET
Absolute Monocytes: 435 cells/uL (ref 200–950)
Basophils Absolute: 32 cells/uL (ref 0–200)
Basophils Relative: 0.5 %
Eosinophils Absolute: 230 cells/uL (ref 15–500)
Eosinophils Relative: 3.6 %
HCT: 42.6 % (ref 38.5–50.0)
Hemoglobin: 15 g/dL (ref 13.2–17.1)
Lymphs Abs: 2803 cells/uL (ref 850–3900)
MCH: 32 pg (ref 27.0–33.0)
MCHC: 35.2 g/dL (ref 32.0–36.0)
MCV: 90.8 fL (ref 80.0–100.0)
MPV: 11.6 fL (ref 7.5–12.5)
Monocytes Relative: 6.8 %
Neutro Abs: 2899 cells/uL (ref 1500–7800)
Neutrophils Relative %: 45.3 %
Platelets: 176 10*3/uL (ref 140–400)
RBC: 4.69 10*6/uL (ref 4.20–5.80)
RDW: 13.3 % (ref 11.0–15.0)
Total Lymphocyte: 43.8 %
WBC: 6.4 10*3/uL (ref 3.8–10.8)

## 2018-06-11 LAB — COMPREHENSIVE METABOLIC PANEL
AG Ratio: 1.4 (calc) (ref 1.0–2.5)
ALT: 32 U/L (ref 9–46)
AST: 28 U/L (ref 10–35)
Albumin: 4.2 g/dL (ref 3.6–5.1)
Alkaline phosphatase (APISO): 82 U/L (ref 35–144)
BUN: 12 mg/dL (ref 7–25)
CO2: 21 mmol/L (ref 20–32)
Calcium: 9.6 mg/dL (ref 8.6–10.3)
Chloride: 105 mmol/L (ref 98–110)
Creat: 1.22 mg/dL (ref 0.70–1.33)
Globulin: 2.9 g/dL (calc) (ref 1.9–3.7)
Glucose, Bld: 128 mg/dL — ABNORMAL HIGH (ref 65–99)
Potassium: 4.2 mmol/L (ref 3.5–5.3)
Sodium: 137 mmol/L (ref 135–146)
Total Bilirubin: 0.3 mg/dL (ref 0.2–1.2)
Total Protein: 7.1 g/dL (ref 6.1–8.1)

## 2018-06-11 LAB — LIPID PANEL
Cholesterol: 231 mg/dL — ABNORMAL HIGH (ref <200)
HDL: 34 mg/dL — ABNORMAL LOW (ref >=40)
LDL Cholesterol (Calc): 149 mg/dL (calc) — ABNORMAL HIGH
Non-HDL Cholesterol (Calc): 197 mg/dL (calc) — ABNORMAL HIGH (ref <130)
Total CHOL/HDL Ratio: 6.8 (calc) — ABNORMAL HIGH (ref <5.0)
Triglycerides: 310 mg/dL — ABNORMAL HIGH (ref <150)

## 2018-06-11 LAB — RPR: RPR Ser Ql: NONREACTIVE

## 2018-06-11 LAB — VITAMIN B1: Vitamin B1 (Thiamine): 17 nmol/L (ref 8–30)

## 2018-06-11 MED ORDER — THIAMINE HCL 50 MG PO TABS: 50.0000 mg | ORAL_TABLET | Freq: Every day | ORAL | 0 refills | Status: DC

## 2018-06-12 LAB — HIV-1 RNA QUANT-NO REFLEX-BLD
HIV 1 RNA Quant: 102 copies/mL — ABNORMAL HIGH
HIV-1 RNA Quant, Log: 2.01 Log copies/mL — ABNORMAL HIGH

## 2018-06-18 ENCOUNTER — Encounter: Payer: Self-pay | Admitting: Family Medicine

## 2018-06-18 DIAGNOSIS — M79671 Pain in right foot: Secondary | ICD-10-CM

## 2018-06-20 ENCOUNTER — Encounter: Payer: Self-pay | Admitting: Internal Medicine

## 2018-06-20 ENCOUNTER — Other Ambulatory Visit: Payer: Self-pay

## 2018-06-20 ENCOUNTER — Ambulatory Visit (INDEPENDENT_AMBULATORY_CARE_PROVIDER_SITE_OTHER): Payer: Medicaid Other | Admitting: Internal Medicine

## 2018-06-20 VITALS — Wt 235.0 lb

## 2018-06-20 DIAGNOSIS — N182 Chronic kidney disease, stage 2 (mild): Secondary | ICD-10-CM

## 2018-06-20 DIAGNOSIS — B20 Human immunodeficiency virus [HIV] disease: Secondary | ICD-10-CM

## 2018-06-20 DIAGNOSIS — E7800 Pure hypercholesterolemia, unspecified: Secondary | ICD-10-CM

## 2018-06-20 MED ORDER — ROSUVASTATIN CALCIUM 20 MG PO TABS: 20.0000 mg | ORAL_TABLET | Freq: Every day | ORAL | 11 refills | Status: DC

## 2018-06-20 NOTE — Progress Notes (Signed)
RFV: follow up for   Patient ID: Dylan [name], male   DOB: 1965-07-27, 53 y.o.   MRN: [id number]  HPI  Dylan [name] is a 53yo M with hiv disease, well controlled. Continues to take biktarvy daily. He reports that he did go back to see his mom in [place] last month since his mom had covid-19+ went to visit her 3 weeks after she released from hospital.  His girlfriend is also hiv +, undetectable  Outpatient Encounter Medications as of 06/20/2018  Medication Sig  . BIKTARVY 50-200-25 MG TABS tablet TAKE 1 TABLET BY MOUTH DAILY  . gabapentin (NEURONTIN) 300 MG capsule Take 1 capsule (300 mg total) by mouth 3 (three) times daily.  . hydrOXYzine (ATARAX/VISTARIL) 25 MG tablet Take 25 mg by mouth 3 (three) times daily as needed.  Marland Kitchen QUEtiapine (SEROQUEL) 200 MG tablet Take 200 mg by mouth at bedtime.  . thiamine 50 MG tablet Take 1 tablet (50 mg total) by mouth daily.  . traZODone (DESYREL) 100 MG tablet Take 2 tablets (200 mg total) by mouth at bedtime. For insomnia.  Marland Kitchen albuterol (PROVENTIL HFA;VENTOLIN HFA) 108 (90 BASE) MCG/ACT inhaler Inhale 2 puffs into the lungs every 6 (six) hours as needed for wheezing or shortness of breath.  . [DISCONTINUED] amLODipine (NORVASC) 10 MG tablet Take 1 tablet (10 mg total) by mouth at bedtime. (Patient not taking: Reported on 05/27/2018)  . [DISCONTINUED] haloperidol (HALDOL) 10 MG tablet Take 10 mg by mouth at bedtime.  . [DISCONTINUED] haloperidol (HALDOL) 5 MG tablet Take 5 mg by mouth 4 (four) times daily.  . [DISCONTINUED] OLANZapine (ZYPREXA) 10 MG tablet Take 1 tablet (10 mg total) by mouth at bedtime. (Patient not taking: Reported on 05/27/2018)  . [DISCONTINUED] polyethylene glycol (MIRALAX / GLYCOLAX) packet Take 17 g by mouth daily as needed. (Patient not taking: Reported on 05/27/2018)  . [DISCONTINUED] risperiDONE (RISPERDAL) 1 MG tablet Take 1 tablet (1 mg total) by mouth 2 (two) times daily. (Patient not taking: Reported on 05/27/2018)   No  facility-administered encounter medications on file as of 06/20/2018.      Patient Active Problem List   Diagnosis Date Noted  . ESRD needing dialysis ([place]) 03/03/2018  . Cocaine abuse with cocaine-induced mood disorder ([place]) 05/18/2017  . Schizoaffective disorder, bipolar type ([place])   . Acute renal failure (ARF) ([place])   . Suicidal ideations   . Chronic 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 coma ([place])   . AKI (acute kidney injury) ([place]) 02/19/2017  . Rhabdomyolysis 02/19/2017  . Suicidal overdose ([place]) 02/19/2017  . Acute hepatitis 02/19/2017  . Elevated LFTs   . Homicidal ideations   . Schizoaffective disorder, depressive type ([place]) 09/05/2011  . 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 Degenerative disc disease 08/21/2011  . HIV disease ([place]) 08/17/2011     Health Maintenance Due  Topic Date Due  . COLONOSCOPY  08/02/2015    Social History   Tobacco Use  . Smoking status: Current Every Day Smoker    Packs/day: 0.50    Years: 15.00    Pack years: 7.50    Types: Cigarettes  . Smokeless tobacco: Current User  Substance Use Topics  . Alcohol use: No    Comment: past  . Drug use: Yes    Types: Cocaine    Comment: past   Review of Systems Review of Systems  Constitutional: Negative for fever, chills, diaphoresis, activity change, appetite change, fatigue and unexpected weight change.  HENT: Negative for congestion, sore throat, rhinorrhea, sneezing, trouble swallowing and sinus pressure.  Eyes: Negative for photophobia and visual disturbance.  Respiratory: Negative for cough, chest tightness, shortness of breath, wheezing and stridor.  Cardiovascular: Negative for chest pain, palpitations and leg swelling.  Gastrointestinal: Negative for nausea, vomiting, abdominal pain, diarrhea, constipation, blood in stool, abdominal distention and anal bleeding.  Genitourinary: Negative for dysuria, hematuria, flank pain and difficulty  urinating.  Musculoskeletal: Negative for myalgias, back pain, joint swelling, arthralgias and gait problem.  Skin: Negative for color change, pallor, rash and wound.  Neurological: Negative for dizziness, tremors, weakness and light-headedness.  Hematological: Negative for adenopathy. Does not bruise/bleed easily.  Psychiatric/Behavioral: Negative for behavioral problems, confusion, sleep disturbance, dysphoric mood, decreased concentration and agitation.    Physical Exam  Wt 235 lb (106.6 kg)   BMI 30.17 kg/m  Physical Exam  Constitutional: He is oriented to person, place, and time. He appears well-developed and well-nourished. No distress.  HENT:  Mouth/Throat: Oropharynx is clear and moist. No oropharyngeal exudate.  Cardiovascular: Normal rate, regular rhythm and normal heart sounds. Exam reveals no gallop and no friction rub.  No murmur heard.  Pulmonary/Chest: Effort normal and breath sounds normal. No respiratory distress. He has no wheezes.  Abdominal: Soft. Bowel sounds are normal. He exhibits no distension. There is no tenderness.  Lymphadenopathy:  He has no cervical adenopathy.  Neurological: He is alert and oriented to person, place, and time.  Skin: Skin is warm and dry. No rash noted. No erythema.  Psychiatric: He has a normal mood and affect. His behavior is normal.     Lab Results  Component Value Date   CD4TCELL 34 06/06/2018   Lab Results  Component Value Date   CD4TABS 942 06/06/2018   CD4TABS 1,100 10/23/2017   CD4TABS 700 07/24/2017   Lab Results  Component Value Date   HIV1RNAQUANT 102 (H) 06/06/2018   Lab Results  Component Value Date   HEPBSAB NON-REACTIVE 07/24/2017   Lab Results  Component Value Date   LABRPR NON-REACTIVE 06/06/2018    CBC Lab Results  Component Value Date   WBC 6.4 06/06/2018   RBC 4.69 06/06/2018   HGB 15.0 06/06/2018   HCT 42.6 06/06/2018   PLT 176 06/06/2018   MCV 90.8 06/06/2018   MCH 32.0 06/06/2018   MCHC  35.2 06/06/2018   RDW 13.3 06/06/2018   LYMPHSABS 2,803 06/06/2018   MONOABS 0.7 03/24/2017   EOSABS 230 06/06/2018    BMET Lab Results  Component Value Date   NA 137 06/06/2018   K 4.2 06/06/2018   CL 105 06/06/2018   CO2 21 06/06/2018   GLUCOSE 128 (H) 06/06/2018   BUN 12 06/06/2018   CREATININE 1.22 06/06/2018   CALCIUM 9.6 06/06/2018   GFRNONAA 73 10/23/2017   GFRAA 84 10/23/2017      Assessment and Plan  Hypercholesteremia = - diet modification - start on rosuvastatin  hiv disease with small Viral load blip, = continue on biktarvy but plan in 3 weeks to repeat to see that its improve -back to being undetectable  ckd 2= cr is WNL  Hemoglobin a1c today

## 2018-06-21 LAB — HEMOGLOBIN A1C
Hgb A1c MFr Bld: 5.7 % of total Hgb — ABNORMAL HIGH (ref <5.7)
Mean Plasma Glucose: 117 (calc)
eAG (mmol/L): 6.5 (calc)

## 2018-06-23 ENCOUNTER — Encounter: Payer: Self-pay | Admitting: Family Medicine

## 2018-06-24 ENCOUNTER — Other Ambulatory Visit: Payer: Self-pay | Admitting: Family Medicine

## 2018-06-24 ENCOUNTER — Other Ambulatory Visit: Payer: Self-pay | Admitting: Internal Medicine

## 2018-06-24 DIAGNOSIS — M25561 Pain in right knee: Secondary | ICD-10-CM

## 2018-06-26 ENCOUNTER — Other Ambulatory Visit: Payer: Self-pay

## 2018-06-26 ENCOUNTER — Ambulatory Visit (INDEPENDENT_AMBULATORY_CARE_PROVIDER_SITE_OTHER): Payer: Medicaid Other | Admitting: Family Medicine

## 2018-06-26 ENCOUNTER — Encounter: Payer: Self-pay | Admitting: Family Medicine

## 2018-06-26 DIAGNOSIS — G8929 Other chronic pain: Secondary | ICD-10-CM | POA: Diagnosis not present

## 2018-06-26 DIAGNOSIS — M25562 Pain in left knee: Secondary | ICD-10-CM | POA: Diagnosis not present

## 2018-06-26 DIAGNOSIS — M79671 Pain in right foot: Secondary | ICD-10-CM

## 2018-06-26 NOTE — Progress Notes (Signed)
Office Visit Note   Patient: Dylan [name]           Date of Birth: 12/28/1965           MRN: [id number] Visit Date: 06/26/2018 Requested by: Charlott Rakes, MD [place],  [place] [id number] PCP: Charlott Rakes, MD  Subjective: Chief Complaint  Patient presents with  . Left Knee - Pain    Knee has "always been bad" but it has been hurting worse since he broke his right foot 04/11/2018. He has been putting more of his weight on his left leg. Lateral aspect of knee swells.    HPI: He is here with 2 concerns.  His right foot continues to bother him more than 2 months status post sprain.  Pain on the medial side of his midfoot, makes him walk with a limp.  His left knee is been bothering him due to limping from his foot injury.  While standing intermittent problems with his knee, he has had cortisone injections in the past and had x-rays done within the last year which were read as normal.  [place].  Popping and occasional locking.               ROS: Chills.  All other systems were reviewed and are negative.  Objective: Vital Signs: There were no vitals taken for this visit.  Physical Exam:  General:  Alert and oriented, in no acute distress. Pulm:  Breathing unlabored. Psy:  Normal mood, congruent affect. Skin: no rash on the skin, no erythema. Left knee: 1-2+ effusion with no warmth.  Ligaments feel stable.  2+ patellofemoral crepitus with no pain on patellar compression.  Exquisite tenderness over the lateral joint line with pain and a palpable click on McMurray's. Right foot: Remains tender near the Lisfranc joint.    Imaging: None today.  Assessment & Plan: 1.  Persistent right foot pain 2 months status post injury, concerning for Lisfranc ligament tear. -MRI to evaluate.  2.  Chronic intermittent left knee pain worsened after injury, suspicious for lateral meniscus tear. -MRI to evaluate, arthroscopic debridement if indicated.      Procedures: No procedures performed  No notes on file     PMFS History: Patient Active Problem List   Diagnosis Date Noted  . ESRD needing dialysis ([place]) 03/03/2018  . Cocaine abuse with cocaine-induced mood disorder ([place]) 05/18/2017  . Schizoaffective disorder, bipolar type ([place])   . Acute renal failure (ARF) ([place])   . Suicidal ideations   . Chronic 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 coma ([place])   . AKI (acute kidney injury) ([place]) 02/19/2017  . Rhabdomyolysis 02/19/2017  . Suicidal overdose ([place]) 02/19/2017  . Acute hepatitis 02/19/2017  . Elevated LFTs   . Homicidal ideations   . Schizoaffective disorder, depressive type ([place]) 09/05/2011  . 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 Degenerative disc disease 08/21/2011  . HIV disease ([place]) 08/17/2011   Past Medical History:  Diagnosis Date  . Arthritis   . Bipolar 1 disorder ([place])   . Depression   . Hepatitis B   . Human immunodeficiency virus (HIV) ([place])   . Schizophrenia ([place])     History reviewed. No pertinent family history.  Past Surgical History:  Procedure Laterality Date  . IR FLUORO GUIDE CV LINE RIGHT  02/22/2017  . IR US GUIDE VASC ACCESS RIGHT  02/22/2017  . TUMOR REMOVAL     From  Chest   Social History   Occupational History  . Not on file  Tobacco Use  . Smoking status: Current Every Day Smoker    Packs/day: 0.50    Years: 15.00    Pack years: 7.50    Types: Cigarettes  . Smokeless tobacco: Current User  Substance and Sexual Activity  . Alcohol use: No    Comment: past  . Drug use: Yes    Types: Cocaine    Comment: past  . Sexual activity: Yes    Comment: condoms given

## 2018-07-01 ENCOUNTER — Ambulatory Visit
Admission: RE | Admit: 2018-07-01 | Discharge: 2018-07-01 | Disposition: A | Discharge status: Home / Self Care | Payer: Medicaid Other | Source: Ambulatory Visit | Attending: Family Medicine | Admitting: Family Medicine

## 2018-07-01 ENCOUNTER — Other Ambulatory Visit: Payer: Self-pay

## 2018-07-01 DIAGNOSIS — M79671 Pain in right foot: Secondary | ICD-10-CM

## 2018-07-02 ENCOUNTER — Telehealth: Payer: Self-pay | Admitting: Family Medicine

## 2018-07-02 NOTE — Telephone Encounter (Signed)
Foot MRI shows bone edema in several bones of the midfoot, probably a stress reaction due to the sprain injury.  Fortunately the ligaments don't appear to be torn, so no surgery needed.    Knee MRI is not available yet.

## 2018-07-07 ENCOUNTER — Encounter: Payer: Self-pay | Admitting: Family Medicine

## 2018-07-08 ENCOUNTER — Encounter: Payer: Self-pay | Admitting: Family Medicine

## 2018-07-10 ENCOUNTER — Other Ambulatory Visit: Payer: Self-pay

## 2018-07-10 ENCOUNTER — Ambulatory Visit: Payer: Medicaid Other | Attending: Family Medicine | Admitting: Family Medicine

## 2018-07-10 ENCOUNTER — Encounter: Payer: Self-pay | Admitting: Family Medicine

## 2018-07-10 DIAGNOSIS — F17200 Nicotine dependence, unspecified, uncomplicated: Secondary | ICD-10-CM | POA: Insufficient documentation

## 2018-07-10 DIAGNOSIS — Z760 Encounter for issue of repeat prescription: Secondary | ICD-10-CM

## 2018-07-10 DIAGNOSIS — Z720 Tobacco use: Secondary | ICD-10-CM | POA: Diagnosis not present

## 2018-07-10 DIAGNOSIS — S92324D Nondisplaced fracture of second metatarsal bone, right foot, subsequent encounter for fracture with routine healing: Secondary | ICD-10-CM | POA: Diagnosis not present

## 2018-07-10 DIAGNOSIS — N528 Other male erectile dysfunction: Secondary | ICD-10-CM

## 2018-07-10 MED ORDER — SILDENAFIL CITRATE 50 MG PO TABS: 50.0000 mg | ORAL_TABLET | Freq: Every day | ORAL | 1 refills | Status: DC | PRN

## 2018-07-10 NOTE — Progress Notes (Signed)
Virtual Visit via Telephone Note  I connected with [name], on 07/10/2018 at 3:18 PM by telephone due to the COVID-19 pandemic and verified that I am speaking with the correct person using two identifiers.   Consent: I discussed the limitations, risks, security and privacy concerns of performing an evaluation and management service by telephone and the availability of in person appointments. I also discussed with the patient that there may be a patient responsible charge related to this service. The patient expressed understanding and agreed to proceed.   Location of Patient: On the bus  Location of Provider: Clinic   Persons participating in Telemedicine visit: [place]-CMA Dr. Felecia Shelling     History of Present Illness: Dylan [name] is a 53 year old male with a history of HIV, hypertension, schizoaffective disorder who is seen for an acute visit today complaining of erectile dysfunction and is requesting medication for this.  Symptoms commenced about 2 months ago. He does have a right foot second metatarsal fracture which is followed by orthopedics and he is also being evaluated for left knee pain and has upcoming appointment for an MRI. He did follow-up with psychiatry at [facility] and is being prescribed psychotropic medications. He has no additional concerns today. He currently smokes and is using patches and has nicotine gums with the aim of quitting.   Past Medical History:  Diagnosis Date  . Arthritis   . Bipolar 1 disorder ([place])   . Depression   . Hepatitis B   . Human immunodeficiency virus (HIV) ([place])   . Schizophrenia ([place])    Allergies  Allergen Reactions  . Penicillins Other (See Comments)    Childhood allergy  . Latex Rash  . Tape Rash    Current Outpatient Medications on File Prior to Visit  Medication Sig Dispense Refill  . BIKTARVY 50-200-25 MG TABS tablet TAKE 1 TABLET BY MOUTH DAILY 30 tablet 1  . gabapentin  (NEURONTIN) 300 MG capsule TAKE 1 CAPSULE(300 MG) BY MOUTH THREE TIMES DAILY 90 capsule 1  . hydrOXYzine (ATARAX/VISTARIL) 25 MG tablet Take 25 mg by mouth 3 (three) times daily as needed.    Marland Kitchen QUEtiapine (SEROQUEL) 200 MG tablet Take 200 mg by mouth at bedtime.    . rosuvastatin (CRESTOR) 20 MG tablet Take 1 tablet (20 mg total) by mouth daily. 30 tablet 11  . thiamine 50 MG tablet Take 1 tablet (50 mg total) by mouth daily. 30 tablet 0  . traZODone (DESYREL) 100 MG tablet Take 2 tablets (200 mg total) by mouth at bedtime. For insomnia. 30 tablet 0  . albuterol (PROVENTIL HFA;VENTOLIN HFA) 108 (90 BASE) MCG/ACT inhaler Inhale 2 puffs into the lungs every 6 (six) hours as needed for wheezing or shortness of breath. 8.5 g 0   No current facility-administered medications on file prior to visit.     Observations/Objective: Awake, alert, oriented x3 Not in acute distress  Assessment and Plan: 1. Other male erectile dysfunction - sildenafil (VIAGRA) 50 MG tablet; Take 1 tablet (50 mg total) by mouth daily as needed for erectile dysfunction. At least 24 hours between doses  Dispense: 10 tablet; Refill: 1  2. Closed nondisplaced fracture of second metatarsal bone of right foot with routine healing, subsequent encounter Management as per Dr. hilts-orthopedic  3. Tobacco abuse Discussed smoking cessation and he is using patches are working on quitting   Follow Up Instructions: Keep previously scheduled appointment for follow-up of medical conditions   I discussed the assessment and  treatment plan with the patient. The patient was provided an opportunity to ask questions and all were answered. The patient agreed with the plan and demonstrated an understanding of the instructions.   The patient was advised to call back or seek an in-person evaluation if the symptoms worsen or if the condition fails to improve as anticipated.     I provided 11 minutes total of non-face-to-face time during  this encounter including median intraservice time, reviewing previous notes, labs, imaging, medications, management and patient verbalized understanding.     Charlott Rakes, MD, FAAFP. [place] [facility] [place], [place]   07/10/2018, 3:18 PM

## 2018-07-10 NOTE — Progress Notes (Signed)
Patient has been called and DOB has been verified. Patient has been screened and transferred to PCP to start phone visit.    Patient is having trouble with ED

## 2018-07-15 ENCOUNTER — Encounter: Payer: Self-pay | Admitting: Family Medicine

## 2018-07-16 ENCOUNTER — Ambulatory Visit (INDEPENDENT_AMBULATORY_CARE_PROVIDER_SITE_OTHER): Payer: Medicaid Other | Admitting: Family Medicine

## 2018-07-16 ENCOUNTER — Encounter: Payer: Self-pay | Admitting: Family Medicine

## 2018-07-16 ENCOUNTER — Other Ambulatory Visit: Payer: Self-pay

## 2018-07-16 DIAGNOSIS — M25562 Pain in left knee: Secondary | ICD-10-CM

## 2018-07-16 DIAGNOSIS — G8929 Other chronic pain: Secondary | ICD-10-CM | POA: Diagnosis not present

## 2018-07-16 NOTE — Progress Notes (Signed)
Office Visit Note   Patient: Dylan [name]           Date of Birth: [birth year]           MRN: [id number] Visit Date: 07/16/2018 Requested by: Charlott Rakes, MD [place],  [place] [id number] PCP: Charlott Rakes, MD  Subjective: Chief Complaint  Patient presents with  . Left Knee - Pain    Cortisone injection    HPI: He is here with worsening left knee pain.  He is scheduled for an MRI scan next week, but he is having trouble bearing weight and wonders if he could try a cortisone injection.              ROS: No fevers or chills.  All other systems were reviewed and are negative.  Objective: Vital Signs: There were no vitals taken for this visit.  Physical Exam:  General:  Alert and oriented, in no acute distress. Pulm:  Breathing unlabored. Psy:  Normal mood, congruent affect. Skin: No rash or erythema. Left knee: 1+ effusion with no warmth, tender to palpation across the medial and lateral joint line.  Full active extension, flexion of about 120 degrees.  Imaging: None today.  Assessment & Plan: 1.  Worsening left knee pain, question internal derangement -Cortisone injection today.  MRI next week presuming he is still having pain.     Procedures: Left knee steroid injection: After sterile prep with Betadine, injected 3 cc 1% lidocaine without epinephrine, then using an 18-gauge needle attempted to aspirate his knee but no fluid was obtained.  I used ultrasound to attempt to guide the needle but he has significant synovial thickening with only a small amount of synovial fluid in the superolateral joint recess.  Injected 40 mg methylprednisolone.    PMFS History: Patient Active Problem List   Diagnosis Date Noted  . Tobacco abuse 07/10/2018  . ESRD needing dialysis ([place]) 03/03/2018  . Cocaine abuse with cocaine-induced mood disorder ([place]) 05/18/2017  . Schizoaffective disorder, bipolar type ([place])   . Acute renal failure (ARF) ([place])   . Suicidal  ideations   . Chronic 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 coma ([place])   . AKI (acute kidney injury) ([place]) 02/19/2017  . Rhabdomyolysis 02/19/2017  . Suicidal overdose ([place]) 02/19/2017  . Acute hepatitis 02/19/2017  . Elevated LFTs   . Homicidal ideations   . Schizoaffective disorder, depressive type ([place]) 09/05/2011  . 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 Degenerative disc disease 08/21/2011  . HIV disease ([place]) 08/17/2011   Past Medical History:  Diagnosis Date  . Arthritis   . Bipolar 1 disorder ([place])   . Depression   . Hepatitis B   . Human immunodeficiency virus (HIV) ([place])   . Schizophrenia ([place])     History reviewed. No pertinent family history.  Past Surgical History:  Procedure Laterality Date  . IR FLUORO GUIDE CV LINE RIGHT  02/22/2017  . IR US GUIDE VASC ACCESS RIGHT  02/22/2017  . TUMOR REMOVAL     From Chest   Social History   Occupational History  . Not on file  Tobacco Use  . Smoking status: Current Every Day Smoker    Packs/day: 0.50    Years: 15.00    Pack years: 7.50    Types: Cigarettes  . Smokeless tobacco: Current User  Substance and Sexual Activity  . Alcohol use: No    Comment: past  . Drug use: Yes  Types: Cocaine    Comment: past  . Sexual activity: Yes    Comment: condoms given

## 2018-07-23 ENCOUNTER — Encounter: Payer: Self-pay | Admitting: Family Medicine

## 2018-07-23 ENCOUNTER — Other Ambulatory Visit: Payer: Medicaid Other

## 2018-07-23 DIAGNOSIS — G8929 Other chronic pain: Secondary | ICD-10-CM

## 2018-07-23 DIAGNOSIS — M25562 Pain in left knee: Secondary | ICD-10-CM

## 2018-07-24 ENCOUNTER — Encounter: Payer: Self-pay | Admitting: Family Medicine

## 2018-07-24 ENCOUNTER — Other Ambulatory Visit: Payer: Self-pay | Admitting: Internal Medicine

## 2018-07-24 DIAGNOSIS — B20 Human immunodeficiency virus [HIV] disease: Secondary | ICD-10-CM

## 2018-07-25 ENCOUNTER — Ambulatory Visit: Payer: Medicaid Other

## 2018-07-25 ENCOUNTER — Other Ambulatory Visit: Payer: Self-pay

## 2018-07-25 ENCOUNTER — Encounter: Payer: Self-pay | Admitting: Family Medicine

## 2018-07-25 ENCOUNTER — Other Ambulatory Visit: Payer: Self-pay | Admitting: Family Medicine

## 2018-07-25 DIAGNOSIS — N528 Other male erectile dysfunction: Secondary | ICD-10-CM

## 2018-07-25 DIAGNOSIS — G8929 Other chronic pain: Secondary | ICD-10-CM

## 2018-07-25 MED ORDER — SILDENAFIL CITRATE 50 MG PO TABS: 50.0000 mg | ORAL_TABLET | Freq: Every day | ORAL | 1 refills | Status: DC | PRN

## 2018-07-25 NOTE — Telephone Encounter (Signed)
Please assist with this mychart concern

## 2018-07-26 ENCOUNTER — Telehealth: Payer: Self-pay | Admitting: Family Medicine

## 2018-07-26 NOTE — Telephone Encounter (Signed)
Patient is requesting the Viagra via mychart.

## 2018-07-26 NOTE — Telephone Encounter (Signed)
Hopefully, this will help for getting his insurance company to authorize his MRI.

## 2018-07-26 NOTE — Telephone Encounter (Signed)
X-rays of the left knee obtained July 25, 2018, reviewed reveal moderate medial compartment narrowing, periarticular spurring in the lateral compartment and in the patellofemoral joint.  No definite loose body seen.  We will proceed with MRI scan as scheduled.

## 2018-07-29 ENCOUNTER — Encounter: Payer: Self-pay | Admitting: Family Medicine

## 2018-07-30 NOTE — Telephone Encounter (Signed)
Faxed clinicals to [employer] for further review, faxed to [phone]

## 2018-08-13 ENCOUNTER — Encounter: Payer: Self-pay | Admitting: Family Medicine

## 2018-08-21 ENCOUNTER — Other Ambulatory Visit: Payer: Self-pay | Admitting: Internal Medicine

## 2018-08-27 ENCOUNTER — Encounter: Payer: Self-pay | Admitting: Family Medicine

## 2018-08-28 ENCOUNTER — Telehealth: Payer: Self-pay | Admitting: Family Medicine

## 2018-08-28 DIAGNOSIS — G8929 Other chronic pain: Secondary | ICD-10-CM

## 2018-08-28 DIAGNOSIS — M25562 Pain in left knee: Secondary | ICD-10-CM

## 2018-08-28 NOTE — Telephone Encounter (Signed)
Cortisone injection didn't help his knee pain.  Can hardly walk due to pain.  Will order MRI.

## 2018-08-30 ENCOUNTER — Other Ambulatory Visit: Payer: Self-pay | Admitting: Family Medicine

## 2018-08-30 DIAGNOSIS — M25562 Pain in left knee: Secondary | ICD-10-CM

## 2018-08-30 DIAGNOSIS — G8929 Other chronic pain: Secondary | ICD-10-CM

## 2018-08-30 NOTE — Telephone Encounter (Signed)
Pt was denied by [employer] again, sent inbasket message to [place] and [facility] advising needs P2P review.

## 2018-09-03 ENCOUNTER — Encounter: Payer: Self-pay | Admitting: Family Medicine

## 2018-09-03 ENCOUNTER — Ambulatory Visit: Payer: Medicaid Other | Attending: Family Medicine | Admitting: Family Medicine

## 2018-09-03 ENCOUNTER — Other Ambulatory Visit: Payer: Self-pay

## 2018-09-03 VITALS — BP 128/70 | HR 80 | Temp 98.2°F | Ht 74.0 in | Wt 243.0 lb

## 2018-09-03 DIAGNOSIS — I10 Essential (primary) hypertension: Secondary | ICD-10-CM | POA: Diagnosis not present

## 2018-09-03 DIAGNOSIS — F250 Schizoaffective disorder, bipolar type: Secondary | ICD-10-CM | POA: Diagnosis not present

## 2018-09-03 DIAGNOSIS — M1990 Unspecified osteoarthritis, unspecified site: Secondary | ICD-10-CM | POA: Diagnosis not present

## 2018-09-03 DIAGNOSIS — Z21 Asymptomatic human immunodeficiency virus [HIV] infection status: Secondary | ICD-10-CM | POA: Insufficient documentation

## 2018-09-03 DIAGNOSIS — M25562 Pain in left knee: Secondary | ICD-10-CM | POA: Insufficient documentation

## 2018-09-03 DIAGNOSIS — Z1211 Encounter for screening for malignant neoplasm of colon: Secondary | ICD-10-CM

## 2018-09-03 DIAGNOSIS — Z23 Encounter for immunization: Secondary | ICD-10-CM | POA: Insufficient documentation

## 2018-09-03 DIAGNOSIS — S92324D Nondisplaced fracture of second metatarsal bone, right foot, subsequent encounter for fracture with routine healing: Secondary | ICD-10-CM

## 2018-09-03 DIAGNOSIS — R600 Localized edema: Secondary | ICD-10-CM | POA: Diagnosis not present

## 2018-09-03 DIAGNOSIS — Z79899 Other long term (current) drug therapy: Secondary | ICD-10-CM | POA: Insufficient documentation

## 2018-09-03 DIAGNOSIS — G8929 Other chronic pain: Secondary | ICD-10-CM | POA: Diagnosis not present

## 2018-09-03 NOTE — Progress Notes (Signed)
Patient is having pain in left knee and right foot.

## 2018-09-03 NOTE — Patient Instructions (Signed)
Chronic Knee Pain, Adult Knee pain that lasts longer than 3 months is called chronic knee pain. You may have pain in one or both knees. Symptoms of chronic knee pain may also include swelling and stiffness. The most common cause is age-related wear and tear (osteoarthritis) of your knee joint. Many conditions can cause chronic knee pain. Treatment depends on the cause. The main treatments are physical therapy and weight loss. It may also be treated with medicines, injections, a knee sleeve or brace, and by using crutches. Rest, ice, compression (pressure), and elevation (RICE) therapy may also be recommended. Follow these instructions at home: If you have a knee sleeve or brace:   Wear it as told by your doctor. Remove it only as told by your doctor.  Loosen it if your toes: ? Tingle. ? Become numb. ? Turn cold and blue.  Keep it clean.  If the sleeve or brace is not waterproof: ? Do not let it get wet. ? Remove it if told by your doctor, or cover it with a watertight covering when you take a bath or shower. Managing pain, stiffness, and swelling      If told, put heat on your knee. Do this as often as told by your doctor. Use the heat source that your doctor recommends, such as a moist heat pack or a heating pad. ? If you have a removable sleeve or brace, remove it as told by your doctor. ? Place a towel between your skin and the heat source. ? Leave the heat on for 20-30 minutes. ? Remove the heat if your skin turns bright red. This is very important if you are unable to feel pain, heat, or cold. You may have a greater risk of getting burned.  If told, put ice on your knee. ? If you have a removable sleeve or brace, remove it as told by your doctor. ? Put ice in a plastic bag. ? Place a towel between your skin and the bag. ? Leave the ice on for 20 minutes, 2-3 times a day.  Move your toes often.  Raise (elevate) the injured area above the level of your heart while you are  sitting or lying down. Activity  Avoid activities where both feet leave the ground at the same time (high-impact activities). Examples are running, jumping rope, and doing jumping jacks.  Return to your normal activities as told by your doctor. Ask your doctor what activities are safe for you.  Follow the exercise plan that your doctor makes for you. Your doctor may suggest that you: ? Avoid activities that make knee pain worse. You may need to change the exercises that you do, the sports that you participate in, or your job duties. ? Wear shoes with cushioned soles. ? Avoid high-impact activities or sports that require running and sudden changes in direction. ? Do exercises or physical therapy as told by your doctor. Physical therapy is planned to match your needs and abilities. ? Do exercises that increase your balance and strength, such as tai chi and yoga.  Do not use your injured knee to support your body weight until your doctor says that you can. Use crutches, a cane, or a walker, as told by your doctor. General instructions  Take over-the-counter and prescription medicines only as told by your doctor.  If you are overweight, work with your doctor and a food expert (dietitian) to set goals to lose weight. Being overweight can make your knee hurt more.  Do   not use any products that contain nicotine or tobacco, such as cigarettes, e-cigarettes, and chewing tobacco. If you need help quitting, ask your doctor.  Keep all follow-up visits as told by your doctor. This is important. Contact a doctor if:  You have knee pain that is not getting better or gets worse.  You are not able to do your exercises due to knee pain. Get help right away if:  Your knee swells and the swelling becomes worse.  You cannot move your knee.  You have very bad knee pain. Summary  Knee pain that lasts more than 3 months is considered chronic knee pain.  The main treatments for chronic knee pain are  physical therapy and weight loss. You may also need to take medicines, wear a knee sleeve or brace, use crutches, and put ice or heat on your knee.  Lose weight if you are overweight. Work with your doctor and a food expert (dietitian) to help you set goals to lose weight. Being overweight can make your knee hurt more.  Work with a physical therapist to make a safe exercise program, as told by your doctor. This information is not intended to replace advice given to you by your health care provider. Make sure you discuss any questions you have with your health care provider. Document Released: 03/07/2018 Document Revised: 03/07/2018 Document Reviewed: 03/07/2018 Elsevier Patient Education  2020 Elsevier Inc.  

## 2018-09-03 NOTE — Progress Notes (Signed)
Subjective:  Patient ID: Dylan [name], male    DOB: 06/21/1965  Age: 53 y.o. MRN: [id number]  CC:  Chief Complaint  Patient presents with  . Knee Pain     HPI DAQUARIUS FITZSIMMONS is a 53 year old male with a history of HIV, hypertension, schizoaffective disorder, chronic left knee pain, right second metatarsal fracture who presents today for a follow-up visit. He continues to experience left knee pain and is status post steroid injection by orthopedics Dr. Junius Roads on 07/16/2018; arthrocentesis revealed a dry tap.  For his right second toe metatarsal fracture he was initially in a boot which he is now not wearing but experiences pain which radiates from his big toe and first webspace proximally towards the dorsum of his foot. MRI of his left knee ordered however denied by insurance and he would need to undergo PT which starts next month.  He has associated left knee edema and use of Aleve has provided some relief of his knee pain but not of his right foot pain. His schizoaffective disorder is managed by psychiatry at [facility], [place] is managed by infectious disease-Dr. Karolee Ohs. He is requesting a refill of Viagra as he was unable to afford 4 tablets from what was prescribed last week-reviewed his med list and informed that he does have a refill at the pharmacy.  Past Medical History:  Diagnosis Date  . Arthritis   . Bipolar 1 disorder ([place])   . Depression   . Hepatitis B   . Human immunodeficiency virus (HIV) ([place])   . Schizophrenia [facility])     Past Surgical History:  Procedure Laterality Date  . IR FLUORO GUIDE CV LINE RIGHT  02/22/2017  . IR US GUIDE VASC ACCESS RIGHT  02/22/2017  . TUMOR REMOVAL     From Chest    History reviewed. No pertinent family history.  Allergies  Allergen Reactions  . Penicillins Other (See Comments)    Childhood allergy  . Latex Rash  . Tape Rash    Outpatient Medications Prior to Visit  Medication Sig Dispense Refill  . BIKTARVY 50-200-25 MG  TABS tablet TAKE 1 TABLET BY MOUTH DAILY 30 tablet 3  . hydrOXYzine (ATARAX/VISTARIL) 25 MG tablet Take 25 mg by mouth 3 (three) times daily as needed.    Marland Kitchen QUEtiapine (SEROQUEL) 200 MG tablet Take 200 mg by mouth at bedtime.    . rosuvastatin (CRESTOR) 20 MG tablet Take 1 tablet (20 mg total) by mouth daily. 30 tablet 11  . sildenafil (VIAGRA) 50 MG tablet Take 1 tablet (50 mg total) by mouth daily as needed for erectile dysfunction. At least 24 hours between doses 10 tablet 1  . traZODone (DESYREL) 100 MG tablet Take 2 tablets (200 mg total) by mouth at bedtime. For insomnia. 30 tablet 0  . albuterol (PROVENTIL HFA;VENTOLIN HFA) 108 (90 BASE) MCG/ACT inhaler Inhale 2 puffs into the lungs every 6 (six) hours as needed for wheezing or shortness of breath. 8.5 g 0  . gabapentin (NEURONTIN) 300 MG capsule TAKE 1 CAPSULE(300 MG) BY MOUTH THREE TIMES DAILY 90 capsule 1  . thiamine 50 MG tablet Take 1 tablet (50 mg total) by mouth daily. (Patient not taking: Reported on 09/03/2018) 30 tablet 0   No facility-administered medications prior to visit.      ROS Review of Systems  Constitutional: Negative for activity change and appetite change.  HENT: Negative for sinus pressure and sore throat.   Eyes: Negative for visual disturbance.  Respiratory: Negative  for cough, chest tightness and shortness of breath.   Cardiovascular: Negative for chest pain and leg swelling.  Gastrointestinal: Negative for abdominal distention, abdominal pain, constipation and diarrhea.  Endocrine: Negative.   Genitourinary: Negative for dysuria.  Musculoskeletal:       See HPI  Skin: Negative for rash.  Allergic/Immunologic: Negative.   Neurological: Negative for weakness, light-headedness and numbness.  Psychiatric/Behavioral: Negative for behavioral problems, dysphoric mood and suicidal ideas.    Objective:  BP 128/70   Pulse 80   Temp 98.2 F (36.8 C) (Oral)   Ht 6\' 2"  (1.88 m)   Wt 243 lb (110.2 kg)   SpO2  96%   BMI 31.20 kg/m   BP/Weight 09/03/2018 [id number]  Systolic BP 0000000 - [id number]  Diastolic BP 70 - 83  Wt. (Lbs) 243 235 -  BMI 31.2 30.17 -  Some encounter information is confidential and restricted. Go to Review Flowsheets activity to see all data.      Physical Exam  CMP Latest Ref Rng & Units 06/06/2018 10/23/2017 08/13/2017  Glucose 65 - 99 mg/dL 128(H) 94 116(H)  BUN 7 - 25 mg/dL 12 14 18   Creatinine 0.70 - 1.33 mg/dL 1.22 1.15 1.33  Sodium 135 - 146 mmol/L 137 137 138  Potassium 3.5 - 5.3 mmol/L 4.2 4.2 4.1  Chloride 98 - 110 mmol/L 105 103 103  CO2 20 - 32 mmol/L 21 27 28   Calcium 8.6 - 10.3 mg/dL 9.6 9.9 9.9  Total Protein 6.1 - 8.1 g/dL 7.1 7.4 -  Total Bilirubin 0.2 - 1.2 mg/dL 0.3 0.4 -  Alkaline Phos 38 - 126 U/L - - -  AST 10 - 35 U/L 28 24 -  ALT 9 - 46 U/L 32 23 -    Lipid Panel     Component Value Date/Time   CHOL 231 (H) 06/06/2018 1406   TRIG 310 (H) 06/06/2018 1406   HDL 34 (L) 06/06/2018 1406   CHOLHDL 6.8 (H) 06/06/2018 1406   VLDL 34 12/13/2011 0620   LDLCALC 149 (H) 06/06/2018 1406    CBC    Component Value Date/Time   WBC 6.4 06/06/2018 1406   RBC 4.69 06/06/2018 1406   HGB 15.0 06/06/2018 1406   HCT 42.6 06/06/2018 1406   PLT 176 06/06/2018 1406   MCV 90.8 06/06/2018 1406   MCH 32.0 06/06/2018 1406   MCHC 35.2 06/06/2018 1406   RDW 13.3 06/06/2018 1406   LYMPHSABS 2,803 06/06/2018 1406   MONOABS 0.7 03/24/2017 0023   EOSABS 230 06/06/2018 1406   BASOSABS 32 06/06/2018 1406    Lab Results  Component Value Date   HGBA1C 5.7 (H) 06/20/2018    Assessment & Plan:   1. Chronic pain of left knee Status post corticosteroid injection with no much improvement Continue with knee brace and Aleve which provides some relief Commence PT next month  2. Screening for colon cancer - Ambulatory referral to Gastroenterology  3. Closed nondisplaced fracture of second metatarsal bone of right foot with routine healing, subsequent  encounter Ongoing pain Management as per orthopedics  4. Schizoaffective disorder, bipolar type ([place]) Stable; management as per psychiatry   Health Care Maintenance: See #2 above; flu shot today No orders of the defined types were placed in this encounter.   Follow-up: Return in about 6 months (around 03/06/2019) for medical conditions.       Charlott Rakes, MD, FAAFP. [facility], [place]   09/03/2018, 2:19  PM

## 2018-09-04 ENCOUNTER — Encounter: Payer: Self-pay | Admitting: Gastroenterology

## 2018-09-10 ENCOUNTER — Other Ambulatory Visit: Payer: Self-pay

## 2018-09-10 DIAGNOSIS — B20 Human immunodeficiency virus [HIV] disease: Secondary | ICD-10-CM

## 2018-09-11 ENCOUNTER — Other Ambulatory Visit: Payer: Self-pay

## 2018-09-11 ENCOUNTER — Other Ambulatory Visit: Payer: Medicaid Other

## 2018-09-11 DIAGNOSIS — B20 Human immunodeficiency virus [HIV] disease: Secondary | ICD-10-CM

## 2018-09-12 LAB — T-HELPER CELLS (CD4) COUNT (NOT AT ARMC)
CD4 % Helper T Cell: 35 % (ref 33–65)
CD4 T Cell Abs: 1136 /uL (ref 400–1790)

## 2018-09-14 LAB — COMPREHENSIVE METABOLIC PANEL
AG Ratio: 1.6 (calc) (ref 1.0–2.5)
ALT: 40 U/L (ref 9–46)
AST: 35 U/L (ref 10–35)
Albumin: 4.5 g/dL (ref 3.6–5.1)
Alkaline phosphatase (APISO): 68 U/L (ref 35–144)
BUN: 20 mg/dL (ref 7–25)
CO2: 25 mmol/L (ref 20–32)
Calcium: 9.9 mg/dL (ref 8.6–10.3)
Chloride: 104 mmol/L (ref 98–110)
Creat: 1.24 mg/dL (ref 0.70–1.33)
Globulin: 2.9 g/dL (calc) (ref 1.9–3.7)
Glucose, Bld: 80 mg/dL (ref 65–99)
Potassium: 4.3 mmol/L (ref 3.5–5.3)
Sodium: 137 mmol/L (ref 135–146)
Total Bilirubin: 0.4 mg/dL (ref 0.2–1.2)
Total Protein: 7.4 g/dL (ref 6.1–8.1)

## 2018-09-14 LAB — HIV-1 RNA QUANT-NO REFLEX-BLD
HIV 1 RNA Quant: <20 copies/mL
HIV-1 RNA Quant, Log: <1.3 Log copies/mL

## 2018-09-14 LAB — CBC WITH DIFFERENTIAL/PLATELET
Absolute Monocytes: 589 cells/uL (ref 200–950)
Basophils Absolute: 42 cells/uL (ref 0–200)
Basophils Relative: 0.5 %
Eosinophils Absolute: 349 cells/uL (ref 15–500)
Eosinophils Relative: 4.2 %
HCT: 44.8 % (ref 38.5–50.0)
Hemoglobin: 15.3 g/dL (ref 13.2–17.1)
Lymphs Abs: 3453 cells/uL (ref 850–3900)
MCH: 32.2 pg (ref 27.0–33.0)
MCHC: 34.2 g/dL (ref 32.0–36.0)
MCV: 94.3 fL (ref 80.0–100.0)
MPV: 11.8 fL (ref 7.5–12.5)
Monocytes Relative: 7.1 %
Neutro Abs: 3868 cells/uL (ref 1500–7800)
Neutrophils Relative %: 46.6 %
Platelets: 184 10*3/uL (ref 140–400)
RBC: 4.75 10*6/uL (ref 4.20–5.80)
RDW: 13.3 % (ref 11.0–15.0)
Total Lymphocyte: 41.6 %
WBC: 8.3 10*3/uL (ref 3.8–10.8)

## 2018-09-17 ENCOUNTER — Ambulatory Visit: Payer: Medicaid Other | Admitting: Physical Therapy

## 2018-09-23 ENCOUNTER — Ambulatory Visit (INDEPENDENT_AMBULATORY_CARE_PROVIDER_SITE_OTHER): Payer: Medicaid Other | Admitting: Internal Medicine

## 2018-09-23 ENCOUNTER — Other Ambulatory Visit: Payer: Self-pay

## 2018-09-23 ENCOUNTER — Encounter: Payer: Self-pay | Admitting: Internal Medicine

## 2018-09-23 VITALS — BP 126/81 | HR 88 | Temp 98.1°F

## 2018-09-23 DIAGNOSIS — B20 Human immunodeficiency virus [HIV] disease: Secondary | ICD-10-CM

## 2018-09-23 DIAGNOSIS — N182 Chronic kidney disease, stage 2 (mild): Secondary | ICD-10-CM | POA: Diagnosis not present

## 2018-09-23 NOTE — Progress Notes (Signed)
Patient ID: Dylan [name], male   DOB: 1965/12/02, 53 y.o.   MRN: [id number]  HPI  53yo M with hiv disease, CD 4 count of 112[address]/VL<20, not missing any doses of biktarvy. Overall in good health. He works part time, roughly 3 hrs in the am, wears masks.  Denies any flu like illness  Outpatient Encounter Medications as of 09/23/2018  Medication Sig  . BIKTARVY 50-200-25 MG TABS tablet TAKE 1 TABLET BY MOUTH DAILY  . hydrOXYzine (ATARAX/VISTARIL) 25 MG tablet Take 25 mg by mouth 3 (three) times daily as needed.  Marland Kitchen QUEtiapine (SEROQUEL) 200 MG tablet Take 200 mg by mouth at bedtime.  . rosuvastatin (CRESTOR) 20 MG tablet Take 1 tablet (20 mg total) by mouth daily.  . sildenafil (VIAGRA) 50 MG tablet Take 1 tablet (50 mg total) by mouth daily as needed for erectile dysfunction. At least 24 hours between doses  . traZODone (DESYREL) 100 MG tablet Take 2 tablets (200 mg total) by mouth at bedtime. For insomnia.  Marland Kitchen albuterol (PROVENTIL HFA;VENTOLIN HFA) 108 (90 BASE) MCG/ACT inhaler Inhale 2 puffs into the lungs every 6 (six) hours as needed for wheezing or shortness of breath.  . thiamine 50 MG tablet Take 1 tablet (50 mg total) by mouth daily. (Patient not taking: Reported on 09/03/2018)  . [DISCONTINUED] gabapentin (NEURONTIN) 300 MG capsule TAKE 1 CAPSULE(300 MG) BY MOUTH THREE TIMES DAILY (Patient not taking: Reported on 09/23/2018)   No facility-administered encounter medications on file as of 09/23/2018.      Patient Active Problem List   Diagnosis Date Noted  . Tobacco abuse 07/10/2018  . ESRD needing dialysis ([place]) 03/03/2018  . Cocaine abuse with cocaine-induced mood disorder ([place]) 05/18/2017  . Schizoaffective disorder, bipolar type ([place])   . Acute renal failure (ARF) ([place])   . Suicidal ideations   . Chronic 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 coma ([place])   . AKI (acute kidney injury) ([place]) 02/19/2017  . Rhabdomyolysis 02/19/2017  . Suicidal overdose ([place])  02/19/2017  . Acute hepatitis 02/19/2017  . Elevated LFTs   . Homicidal ideations   . Schizoaffective disorder, depressive type ([place]) 09/05/2011  . 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 Degenerative disc disease 08/21/2011  . HIV disease ([place]) 08/17/2011     Health Maintenance Due  Topic Date Due  . COLONOSCOPY  08/02/2015    Social History   Tobacco Use  . Smoking status: Current Every Day Smoker    Packs/day: 0.50    Years: 15.00    Pack years: 7.50    Types: Cigarettes  . Smokeless tobacco: Current User  . Tobacco comment: has patches when he is ready to quit  Substance Use Topics  . Alcohol use: Yes    Comment: occassional use on weekends  . Drug use: Not Currently    Types: Cocaine    Comment: past   Review of Systems Review of Systems  Constitutional: Negative for fever, chills, diaphoresis, activity change, appetite change, fatigue and unexpected weight change.  HENT: Negative for congestion, sore throat, rhinorrhea, sneezing, trouble swallowing and sinus pressure.  Eyes: Negative for photophobia and visual disturbance.  Respiratory: Negative for cough, chest tightness, shortness of breath, wheezing and stridor.  Cardiovascular: Negative for chest pain, palpitations and leg swelling.  Gastrointestinal: Negative for nausea, vomiting, abdominal pain, diarrhea, constipation, blood in stool, abdominal distention and anal bleeding.  Genitourinary: Negative for dysuria, hematuria, flank pain and difficulty urinating.  Musculoskeletal:  Negative for myalgias, back pain, joint swelling, arthralgias and gait problem.  Skin: Negative for color change, pallor, rash and wound.  Neurological: Negative for dizziness, tremors, weakness and light-headedness.  Hematological: Negative for adenopathy. Does not bruise/bleed easily.  Psychiatric/Behavioral: Negative for behavioral problems, confusion, sleep disturbance, dysphoric mood, decreased  concentration and agitation.    Physical Exam   BP 126/81   Pulse 88   Temp 98.1 F (36.7 C) (Oral)   SpO2 95%   Physical Exam  Constitutional: He is oriented to person, place, and time. He appears well-developed and well-nourished. No distress.  HENT:  Mouth/Throat: Oropharynx is clear and moist. No oropharyngeal exudate.  Cardiovascular: Normal rate, regular rhythm and normal heart sounds. Exam reveals no gallop and no friction rub.  No murmur heard.  Pulmonary/Chest: Effort normal and breath sounds normal. No respiratory distress. He has no wheezes.  Lymphadenopathy:  He has no cervical adenopathy.  Neurological: He is alert and oriented to person, place, and time.  Skin: Skin is warm and dry. No rash noted. No erythema.  Psychiatric: He has a normal mood and affect. His behavior is normal.    Lab Results  Component Value Date   CD4TCELL 35 09/11/2018   Lab Results  Component Value Date   CD4TABS 1,136 09/11/2018   CD4TABS 942 06/06/2018   CD4TABS 1,100 10/23/2017   Lab Results  Component Value Date   HIV1RNAQUANT <20 NOT DETECTED 09/11/2018   Lab Results  Component Value Date   HEPBSAB NON-REACTIVE 07/24/2017   Lab Results  Component Value Date   LABRPR NON-REACTIVE 06/06/2018    CBC Lab Results  Component Value Date   WBC 8.3 09/11/2018   RBC 4.75 09/11/2018   HGB 15.3 09/11/2018   HCT 44.8 09/11/2018   PLT 184 09/11/2018   MCV 94.3 09/11/2018   MCH 32.2 09/11/2018   MCHC 34.2 09/11/2018   RDW 13.3 09/11/2018   LYMPHSABS 3,453 09/11/2018   MONOABS 0.7 03/24/2017   EOSABS 349 09/11/2018    BMET Lab Results  Component Value Date   NA 137 09/11/2018   K 4.3 09/11/2018   CL 104 09/11/2018   CO2 25 09/11/2018   GLUCOSE 80 09/11/2018   BUN 20 09/11/2018   CREATININE 1.24 09/11/2018   CALCIUM 9.9 09/11/2018   GFRNONAA 73 10/23/2017   GFRAA 84 10/23/2017      Assessment and Plan  hiv disease= well controlled. Continue on biktarvy  ckd  2/long term medication management = cr is stable. Will not need any changes in ART  Health maintenance = Already received flu shot

## 2018-09-25 ENCOUNTER — Encounter: Payer: Self-pay | Admitting: Physical Therapy

## 2018-09-25 ENCOUNTER — Ambulatory Visit: Payer: Medicaid Other | Attending: Family Medicine | Admitting: Physical Therapy

## 2018-09-25 ENCOUNTER — Other Ambulatory Visit: Payer: Self-pay

## 2018-09-25 DIAGNOSIS — M6281 Muscle weakness (generalized): Secondary | ICD-10-CM | POA: Diagnosis present

## 2018-09-25 DIAGNOSIS — M25562 Pain in left knee: Secondary | ICD-10-CM | POA: Diagnosis not present

## 2018-09-25 DIAGNOSIS — M25662 Stiffness of left knee, not elsewhere classified: Secondary | ICD-10-CM | POA: Insufficient documentation

## 2018-09-25 DIAGNOSIS — G8929 Other chronic pain: Secondary | ICD-10-CM | POA: Diagnosis present

## 2018-09-25 NOTE — Therapy (Signed)
[place], [name], [address] Phone: [phone]   Fax:  [phone]  Physical Therapy Evaluation  Patient Details  Name: Dylan [name] MRN: [id number] Date of Birth: Aug 03, 1965 Referring Provider (PT): Eunice Blase, MD   Encounter Date: 09/25/2018  PT End of Session - 09/25/18 1725    Visit Number  1    Number of Visits  4    Date for PT Re-Evaluation  11/20/18    Authorization Type  MCD, submitted for 3 visits on 09/25/2018    PT Start Time  1530    PT Stop Time  1612    PT Time Calculation (min)  42 min    Activity Tolerance  Patient tolerated treatment well    Behavior During Therapy  [facility] for tasks assessed/performed       Past Medical History:  Diagnosis Date  . Arthritis   . Bipolar 1 disorder ([place])   . Depression   . Hepatitis B   . Human immunodeficiency virus (HIV) ([place])   . Schizophrenia [facility])     Past Surgical History:  Procedure Laterality Date  . IR FLUORO GUIDE CV LINE RIGHT  02/22/2017  . IR US GUIDE VASC ACCESS RIGHT  02/22/2017  . TUMOR REMOVAL     From Chest    There were no vitals filed for this visit.   Subjective Assessment - 09/25/18 1534    Subjective  Pt arriving to therpay reporting chronic left knee pain. Pt reporting that since he broke his R ankle on 04/11/2018 his L knee has been bothering him. Pt reporting that he feels like he was putting too much weight on his left knee while his R foot was injured and it aggrivated his knee. Pt reporting that before the R foot injury he was having knee problems especially going up and down stairs. Pt reporting pain of 4/10. pt reporting it can increase to 8-10/10. Riding a car for long periods makes pain worse. Pt also reporting walking long periods makes pain worse.    Pertinent History  arthritis, bipolar, depression, HIV, schizophrenia    Limitations  Sitting;Standing;Walking;House hold activities    How long can you sit  comfortably?  1 hour    How long can you stand comfortably?  15 minutes    How long can you walk comfortably?  1 hour    Diagnostic tests  X-ray    Patient Stated Goals  I want to get back to more active lifestyle, I want to exercise    Currently in Pain?  Yes    Pain Score  4     Pain Location  Knee    Pain Orientation  Left    Pain Descriptors / Indicators  Aching    Pain Type  Chronic pain    Pain Onset  More than a month ago    Pain Frequency  Intermittent    Aggravating Factors   sitting prolonged, standing prolonged, walking more than 1-2 hours    Pain Relieving Factors  over the counter meds helping (Aleve), ice    Multiple Pain Sites  No         [facility] PT Assessment - 09/25/18 0001      Assessment   Medical Diagnosis  Left knee pain    Referring Provider (PT)  Eunice Blase, MD    Onset Date/Surgical Date  --   6 months ago it worsened   Hand Dominance  Right  Prior Therapy  no      Precautions   Precautions  None      Restrictions   Weight Bearing Restrictions  No      Balance Screen   Has the patient fallen in the past 6 months  No    Is the patient reluctant to leave their home because of a fear of falling?   No      Prior Function   Level of Independence  Independent      Cognition   Overall Cognitive Status  Within Functional Limits for tasks assessed      Observation/Other Assessments-Edema    Edema  Circumferential      Circumferential Edema   Circumferential - Right  42.5 cm mid patella    Circumferential - Left   44.5 cm mid patella      Posture/Postural Control   Posture/Postural Control  No significant limitations      ROM / Strength   AROM / PROM / Strength  AROM;Strength      AROM   AROM Assessment Site  Knee    Right/Left Knee  Right;Left    Right Knee Extension  0    Right Knee Flexion  124    Left Knee Extension  0    Left Knee Flexion  110      Strength   Strength Assessment Site  Knee    Right/Left Knee  Right;Left     Right Knee Flexion  5/5    Right Knee Extension  5/5    Left Knee Flexion  4/5    Left Knee Extension  4/5      Flexibility   Soft Tissue Assessment /Muscle Length  yes    Hamstrings  R: 80 degrees, L: 68 degrees    ITB  tightness noted      Palpation   Patella mobility  + patella compression test on L     Palpation comment  TTP on medial and lateral knee joint      Special Tests    Special Tests  Meniscus Tests    Meniscus Tests  other      other   Findings  Negative    Side  Right;Left    Comments  Anterior/Posterior Drawer and Medial/Lateral Drawer      Ambulation/Gait   Ambulation/Gait  Yes    Ambulation/Gait Assistance  7: Independent    Ambulation Distance (Feet)  100 Feet    Assistive device  None    Gait Pattern  Within Functional Limits;Step-through pattern                Objective measurements completed on examination: See above findings.              PT Education - 09/25/18 1721    Person(s) Educated  Patient    Methods  Handout;Explanation;Demonstration    Comprehension  Verbalized understanding;Returned demonstration       PT Short Term Goals - 09/25/18 1739      PT SHORT TERM GOAL #1   Title  Pt will be independent in his HEP and progression.    Baseline  initial HEP issued today    Time  3    Period  Weeks    Status  New    Target Date  10/16/18      PT SHORT TERM GOAL #2   Title  Pt will be able to improve his hamstring flexibility to >/= 80 degrees on the L LE  in order to imrpove function.    Baseline  L: 68 degrees    Time  3    Period  Weeks    Status  New    Target Date  10/16/18      PT SHORT TERM GOAL #3   Title  Pt will be able to perform full squat with pain </= 3/10 in order to perform household activities.    Baseline  pt reporting increaesed pain > 6-7/10.    Time  4    Period  Weeks    Status  New    Target Date  10/23/18                Plan - 09/25/18 1744    Clinical Impression Statement   Pt arriving to [facility]pay reporting left knee pain of 4/10 at rest. Pt with negative drawer test in L knee. Pt with limited hamstring flexibility and edema noted in L knee compared to R. Pt with mild weakness of 4/5 in L knee. Skilled PT needed to address pt's impairments with the below interventions.    Personal Factors and Comorbidities  Comorbidity 2    Comorbidities  arthritis, bipolar, depression, HIV, schizophrenia,R foot fx 04/11/2018    Examination-Activity Limitations  Squat    Examination-Participation Restrictions  Community Activity;Driving;Yard Work    Stability/Clinical Decision Making  Stable/Uncomplicated    Clinical Decision Making  Low    Rehab Potential  Good    PT Frequency  2x / week    PT Duration  6 weeks    PT Treatment/Interventions  Cryotherapy;Electrical Stimulation;Moist Heat;Ultrasound;Gait training;Stair training;Balance training;Therapeutic exercise;Therapeutic activities;Functional mobility training;Neuromuscular re-education;Patient/family education;Manual techniques;Taping;Passive range of motion;Dry needling    PT Next Visit Plan  LE strengtheing, Manual therapy, Modalities as needed.    PT Home Exercise Plan  Access Code: [email or web address]    Consulted and Agree with Plan of Care  Patient       Patient will benefit from skilled therapeutic intervention in order to improve the following deficits and impairments:  Difficulty walking, Decreased range of motion, Decreased strength, Increased edema, Impaired flexibility  Visit Diagnosis: Chronic pain of left knee  Muscle weakness (generalized)  Stiffness of left knee, not elsewhere classified     Problem List Patient Active Problem List   Diagnosis Date Noted  . Tobacco abuse 07/10/2018  . ESRD needing dialysis ([place]) 03/03/2018  . Cocaine abuse with cocaine-induced mood disorder ([place]) 05/18/2017  . Schizoaffective disorder, bipolar type ([place])   . Acute renal failure (ARF) ([place])   . Suicidal ideations   . Chronic  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 coma ([place])   . AKI (acute kidney injury) ([place]) 02/19/2017  . Rhabdomyolysis 02/19/2017  . Suicidal overdose ([place]) 02/19/2017  . Acute hepatitis 02/19/2017  . Elevated LFTs   . Homicidal ideations   . Schizoaffective disorder, depressive type ([place]) 09/05/2011  . 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 Degenerative disc disease 08/21/2011  . HIV disease ([place]) 08/17/2011    Oretha Caprice , PT 09/25/2018, 6:05 PM  [facility] [address] [place], [name], [address] Phone: [phone]   Fax:  [phone]  Name: Dylan [name] MRN: [id number] Date of Birth: Feb 05, 1965

## 2018-09-30 ENCOUNTER — Other Ambulatory Visit: Payer: Medicaid Other

## 2018-10-02 ENCOUNTER — Ambulatory Visit: Payer: Medicaid Other | Admitting: Gastroenterology

## 2018-10-08 ENCOUNTER — Other Ambulatory Visit: Payer: Self-pay

## 2018-10-08 ENCOUNTER — Encounter: Payer: Self-pay | Admitting: Physical Therapy

## 2018-10-08 ENCOUNTER — Ambulatory Visit: Payer: Medicaid Other | Admitting: Physical Therapy

## 2018-10-08 DIAGNOSIS — M25662 Stiffness of left knee, not elsewhere classified: Secondary | ICD-10-CM

## 2018-10-08 DIAGNOSIS — M25562 Pain in left knee: Secondary | ICD-10-CM | POA: Diagnosis not present

## 2018-10-08 DIAGNOSIS — M6281 Muscle weakness (generalized): Secondary | ICD-10-CM

## 2018-10-08 DIAGNOSIS — G8929 Other chronic pain: Secondary | ICD-10-CM

## 2018-10-08 NOTE — Patient Instructions (Signed)
       Voncille Lo, PT Certified Exercise Expert for the Aging Adult  10/08/18 4:13 PM Phone: [phone] Fax: [phone]

## 2018-10-08 NOTE — Therapy (Signed)
[place], [name], [address] Phone: [phone]   Fax:  [phone]  Physical Therapy Treatment  Patient Details  Name: Dylan [name] MRN: [id number] Date of Birth: November 04, 1965 Referring Provider (PT): Eunice Blase, MD   Encounter Date: 10/08/2018  PT End of Session - 10/08/18 1618    Visit Number  2    Number of Visits  4    Date for PT Re-Evaluation  11/20/18    Authorization Type  MCD, submitted for 3 visits on 09/25/2018    PT Start Time  1545    PT Stop Time  1615    PT Time Calculation (min)  30 min    Activity Tolerance  Patient tolerated treatment well    Behavior During Therapy  [facility] for tasks assessed/performed       Past Medical History:  Diagnosis Date  . Arthritis   . Bipolar 1 disorder ([place])   . Depression   . Hepatitis B   . Human immunodeficiency virus (HIV) ([place])   . Schizophrenia [facility])     Past Surgical History:  Procedure Laterality Date  . IR FLUORO GUIDE CV LINE RIGHT  02/22/2017  . IR US GUIDE VASC ACCESS RIGHT  02/22/2017  . TUMOR REMOVAL     From Chest    There were no vitals filed for this visit.  Subjective Assessment - 10/08/18 1549    Subjective  I had pain this morning going up and down steps.    Pertinent History  arthritis, bipolar, depression, HIV, schizophrenia    Pain Score  5     Pain Location  Knee    Pain Orientation  Left    Pain Descriptors / Indicators  Aching    Pain Type  Chronic pain    Pain Onset  More than a month ago    Pain Frequency  Intermittent                       [facility] Adult PT Treatment/Exercise - 10/08/18 0001      Knee/Hip Exercises: Stretches   Active Hamstring Stretch  2 reps;30 seconds;Left    Other Knee/Hip Stretches  utilized 30# deadlift 2 x 10 from floor with hip bend emphasis  also from 6 inch step added stretch to hamstrings with 30 # KB x 5      Knee/Hip Exercises: Standing   Lateral Step Up  3  sets;10 reps;Hand Hold: 1;Step Height: 6"    Forward Step Up  3 sets;Hand Hold: 1;10 reps;Step Height: 6"    Wall Squat  15 reps;2 sets;10 seconds    Other Standing Knee Exercises  sink squat 10 x 3   felt good to knees     Other Standing Knee Exercises  hips against wall and bil DF( tibialis anterior strength)  Using sliders adductor slides 10 x 1 RT and 10 x 1 left     Knee/Hip Exercises: Supine   Quad Sets  2 sets;10 reps    Straight Leg Raises  2 sets;10 reps             PT Education - 10/08/18 1615    Education Details  added standing knee closed chain exercises to HEP    Person(s) Educated  Patient    Methods  Explanation;Demonstration;Tactile cues;Verbal cues;Handout    Comprehension  Verbalized understanding;Returned demonstration       PT Short Term Goals - 09/25/18 1739  PT SHORT TERM GOAL #1   Title  Pt will be independent in his HEP and progression.    Baseline  initial HEP issued today    Time  3    Period  Weeks    Status  New    Target Date  10/16/18      PT SHORT TERM GOAL #2   Title  Pt will be able to improve his hamstring flexibility to >/= 80 degrees on the L LE in order to imrpove function.    Baseline  L: 68 degrees    Time  3    Period  Weeks    Status  New    Target Date  10/16/18      PT SHORT TERM GOAL #3   Title  Pt will be able to perform full squat with pain </= 3/10 in order to perform household activities.    Baseline  pt reporting increaesed pain > 6-7/10.    Time  4    Period  Weeks    Status  New    Target Date  10/23/18               Plan - 10/08/18 1619    Clinical Impression Statement  Pt arriving to PT and ready to work 5/10 pain at rest.  Pt HEP added with standing, stepping and pt was able to perform and stated he felt looser after exericises.  Pt realized he had an MD virtual appt and spent last part of session. talking with MD so shorter session today.    Personal Factors and Comorbidities  Comorbidity 2     Comorbidities  arthritis, bipolar, depression, HIV, schizophrenia,R foot fx 04/11/2018    Examination-Activity Limitations  Squat    Examination-Participation Restrictions  Community Activity;Driving;Yard Work    Stability/Clinical Decision Making  Stable/Uncomplicated    Clinical Decision Making  Low    Rehab Potential  Good    PT Frequency  2x / week    PT Duration  6 weeks    PT Treatment/Interventions  Cryotherapy;Electrical Stimulation;Moist Heat;Ultrasound;Gait training;Stair training;Balance training;Therapeutic exercise;Therapeutic activities;Functional mobility training;Neuromuscular re-education;Patient/family education;Manual techniques;Taping;Passive range of motion;Dry needling    PT Next Visit Plan  LE strengtheing, Manual therapy, Modalities as needed.    PT Home Exercise Plan  Access Code [facility]    Consulted and Agree with Plan of Care  Patient       Patient will benefit from skilled therapeutic intervention in order to improve the following deficits and impairments:  Difficulty walking, Decreased range of motion, Decreased strength, Increased edema, Impaired flexibility  Visit Diagnosis: Chronic pain of left knee  Muscle weakness (generalized)  Stiffness of left knee, not elsewhere classified     Problem List Patient Active Problem List   Diagnosis Date Noted  . Tobacco abuse 07/10/2018  . ESRD needing dialysis ([place]) 03/03/2018  . Cocaine abuse with cocaine-induced mood disorder ([place]) 05/18/2017  . Schizoaffective disorder, bipolar type ([place])   . Acute renal failure (ARF) ([place])   . Suicidal ideations   . Chronic 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 coma ([place])   . AKI (acute kidney injury) ([place]) 02/19/2017  . Rhabdomyolysis 02/19/2017  . Suicidal overdose ([place]) 02/19/2017  . Acute hepatitis 02/19/2017  . Elevated LFTs   . Homicidal ideations   . Schizoaffective disorder, depressive type ([place]) 09/05/2011  . Cocaine abuse with cocaine-induced  psychotic disorder ([place]) 09/05/2011  . Degenerative disc disease 08/21/2011  . HIV disease ([place]) 08/17/2011  Voncille Lo, PT Certified Exercise Expert for the Aging Adult  10/08/18 4:29 PM Phone: [phone] Fax: [phone]  [facility] [address] [place], [name], [address] Phone: [phone]   Fax:  [phone]  Name: Dylan [name] MRN: [id number] Date of Birth: September 18, 1965

## 2018-10-15 ENCOUNTER — Encounter: Payer: Self-pay | Admitting: Physical Therapy

## 2018-10-15 ENCOUNTER — Ambulatory Visit: Payer: Medicaid Other | Attending: Family Medicine | Admitting: Physical Therapy

## 2018-10-15 ENCOUNTER — Other Ambulatory Visit: Payer: Self-pay

## 2018-10-15 DIAGNOSIS — M25662 Stiffness of left knee, not elsewhere classified: Secondary | ICD-10-CM | POA: Diagnosis present

## 2018-10-15 DIAGNOSIS — M6281 Muscle weakness (generalized): Secondary | ICD-10-CM | POA: Insufficient documentation

## 2018-10-15 DIAGNOSIS — M25562 Pain in left knee: Secondary | ICD-10-CM | POA: Diagnosis not present

## 2018-10-15 DIAGNOSIS — G8929 Other chronic pain: Secondary | ICD-10-CM

## 2018-10-15 NOTE — Therapy (Signed)
[place], [name], [address] Phone: [phone]   Fax:  [phone]  Physical Therapy Treatment  Patient Details  Name: Dylan [name] MRN: [id number] Date of Birth: 09-02-1965 Referring Provider (PT): Eunice Blase, MD   Encounter Date: 10/15/2018  PT End of Session - 10/15/18 1638    Visit Number  3    Number of Visits  4    Date for PT Re-Evaluation  11/20/18    Authorization Type  MCD, submitted for 3 visits on 09/25/2018    PT Start Time  1546    PT Stop Time  1637    PT Time Calculation (min)  51 min    Activity Tolerance  Patient tolerated treatment well    Behavior During Therapy  [facility] for tasks assessed/performed       Past Medical History:  Diagnosis Date  . Arthritis   . Bipolar 1 disorder ([place])   . Depression   . Hepatitis B   . Human immunodeficiency virus (HIV) ([place])   . Schizophrenia [facility])     Past Surgical History:  Procedure Laterality Date  . IR FLUORO GUIDE CV LINE RIGHT  02/22/2017  . IR US GUIDE VASC ACCESS RIGHT  02/22/2017  . TUMOR REMOVAL     From Chest    There were no vitals filed for this visit.  Subjective Assessment - 10/15/18 1550    Subjective  I still have pain going up and down steps    Pertinent History  arthritis, bipolar, depression, HIV, schizophrenia    Limitations  Sitting;Standing;Walking;House hold activities    Patient Stated Goals  I want to get back to more active lifestyle, I want to exercise    Currently in Pain?  Yes    Pain Score  7     Pain Location  Knee    Pain Orientation  Left    Pain Descriptors / Indicators  Aching    Pain Type  Chronic pain    Pain Onset  More than a month ago    Multiple Pain Sites  Yes    Pain Score  8    Pain Location  Foot    Pain Orientation  Right    Pain Descriptors / Indicators  Stabbing    Pain Type  Chronic pain                       [facility] Adult PT Treatment/Exercise - 10/15/18 0001       Knee/Hip Exercises: Stretches   Other Knee/Hip Stretches  utilized 30# deadlift 2 x 10 from floor with hip bend emphasis  also from 6 inch step added stretch to hamstrings with 30 # KB x 5      Knee/Hip Exercises: Machines for Strengthening   Cybex Leg Press  6 plates 120lb 2 x 10      Knee/Hip Exercises: Standing   Lateral Step Up  10 reps;Hand Hold: 1;2 sets;Step Height: 8"    Lateral Step Up Limitations  carrying 25# KB    Forward Step Up  Hand Hold: 1;10 reps;2 sets;Step Height: 8"    Forward Step Up Limitations  carrying 25# KB    Wall Squat  15 reps;2 sets;10 seconds    Other Standing Knee Exercises  goblet squat with 25 # KB    Other Standing Knee Exercises  hips against wall and bil DF( tibialis anterior strength)      Modalities  Modalities  Moist Heat      Moist Heat Therapy   Number Minutes Moist Heat  12 Minutes    Moist Heat Location  Knee   LT     Manual Therapy   Manual Therapy  Soft tissue mobilization    Manual therapy comments  skilled palpation with TPDN    Soft tissue mobilization  IASTYM LT Quadriceps and vastus lateralis       Trigger Point Dry Needling - 10/15/18 0001    Consent Given?  Yes    Education Handout Provided  Yes    Muscles Treated Lower Quadrant  Quadriceps;Vastus lateralis    Other Dry Needling  LT side only immediate relief     Quadriceps Response  Twitch response elicited;Palpable increased muscle length    Vastus lateralis Response  Palpable increased muscle length             PT Short Term Goals - 09/25/18 1739      PT SHORT TERM GOAL #1   Title  Pt will be independent in his HEP and progression.    Baseline  initial HEP issued today    Time  3    Period  Weeks    Status  New    Target Date  10/16/18      PT SHORT TERM GOAL #2   Title  Pt will be able to improve his hamstring flexibility to >/= 80 degrees on the L LE in order to imrpove function.    Baseline  L: 68 degrees    Time  3    Period  Weeks     Status  New    Target Date  10/16/18      PT SHORT TERM GOAL #3   Title  Pt will be able to perform full squat with pain </= 3/10 in order to perform household activities.    Baseline  pt reporting increaesed pain > 6-7/10.    Time  4    Period  Weeks    Status  New    Target Date  10/23/18               Plan - 10/15/18 1639    Clinical Impression Statement  Pt arriving to clinic with 7/10 LT knee pain,  Pt complied with all exericises and carrying heavy wt to challenge knee strength.  Pt still with trigger point pain in LT ant and superior patellar pain/quad pain.  Pt consented to TPDN and had good twitch response and immediate pain relief. Pt progressing with knee strength and able to tolerate 120# leg press and using 25# KB with step ups.  Will continue POC for next visit before resubmitting for additional visit after goals assessed    Personal Factors and Comorbidities  Comorbidity 2    Comorbidities  arthritis, bipolar, depression, HIV, schizophrenia,R foot fx 04/11/2018    Examination-Activity Limitations  Squat    Examination-Participation Restrictions  Community Activity;Driving;Yard Work    Stability/Clinical Decision Making  Stable/Uncomplicated    Clinical Decision Making  Low    Rehab Potential  Good    PT Frequency  2x / week    PT Treatment/Interventions  Cryotherapy;Electrical Stimulation;Moist Heat;Ultrasound;Gait training;Stair training;Balance training;Therapeutic exercise;Therapeutic activities;Functional mobility training;Neuromuscular re-education;Patient/family education;Manual techniques;Taping;Passive range of motion;Dry needling    PT Next Visit Plan  LE strengtheing, Manual therapy, Modalities as needed. assess goals and make LTG's and recertify    PT Home Exercise Plan  Access Code [facility]  Patient will benefit from skilled therapeutic intervention in order to improve the following deficits and impairments:  Difficulty walking, Decreased range of  motion, Decreased strength, Increased edema, Impaired flexibility  Visit Diagnosis: Chronic pain of left knee  Muscle weakness (generalized)  Stiffness of left knee, not elsewhere classified     Problem List Patient Active Problem List   Diagnosis Date Noted  . Tobacco abuse 07/10/2018  . ESRD needing dialysis ([place]) 03/03/2018  . Cocaine abuse with cocaine-induced mood disorder ([place]) 05/18/2017  . Schizoaffective disorder, bipolar type ([place])   . Acute renal failure (ARF) ([place])   . Suicidal ideations   . Chronic 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 coma ([place])   . AKI (acute kidney injury) ([place]) 02/19/2017  . Rhabdomyolysis 02/19/2017  . Suicidal overdose ([place]) 02/19/2017  . Acute hepatitis 02/19/2017  . Elevated LFTs   . Homicidal ideations   . Schizoaffective disorder, depressive type ([place]) 09/05/2011  . 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 Degenerative disc disease 08/21/2011  . HIV disease ([place]) 08/17/2011   Voncille Lo, PT Certified Exercise Expert for the Aging Adult  10/15/18 4:45 PM Phone: [phone] Fax: [facility] [address] [place], [name], [address] Phone: [phone]   Fax:  [phone]  Name: Dylan [name] MRN: [id number] Date of Birth: 12-Jun-1965

## 2018-10-15 NOTE — Patient Instructions (Signed)
Trigger Point Dry Needling  . What is Trigger Point Dry Needling (DN)? o DN is a physical therapy technique used to treat muscle pain and dysfunction. Specifically, DN helps deactivate muscle trigger points (muscle knots).  o A thin filiform needle is used to penetrate the skin and stimulate the underlying trigger point. The goal is for a local twitch response (LTR) to occur and for the trigger point to relax. No medication of any kind is injected during the procedure.   . What Does Trigger Point Dry Needling Feel Like?  o The procedure feels different for each individual patient. Some patients report that they do not actually feel the needle enter the skin and overall the process is not painful. Very mild bleeding may occur. However, many patients feel a deep cramping in the muscle in which the needle was inserted. This is the local twitch response.   Marland Kitchen How Will I feel after the treatment? o Soreness is normal, and the onset of soreness may not occur for a few hours. Typically this soreness does not last longer than two days.  o Bruising is uncommon, however; ice can be used to decrease any possible bruising.  o In rare cases feeling tired or nauseous after the treatment is normal. In addition, your symptoms may get worse before they get better, this period will typically not last longer than 24 hours.   . What Can I do After My Treatment? o Increase your hydration by drinking more water for the next 24 hours. o You may place ice or heat on the areas treated that have become sore, however, do not use heat on inflamed or bruised areas. Heat often brings more relief post needling. o You can continue your regular activities, but vigorous activity is not recommended initially after the treatment for 24 hours. o DN is best combined with other physical therapy such as strengthening, stretching, and other therapies.    Voncille Lo, PT Certified Exercise Expert for the Aging Adult  10/15/18 4:18  PM Phone: [phone] Fax: [phone]

## 2018-10-20 IMAGING — CR DG CHEST 2V
2 series · 2 of 2 positions shown · non-contrast
Comparison: 03/01/2017

CLINICAL DATA: Leg swelling query fluid retention.

EXAM:
CHEST - 2 VIEW

[w chest lat]
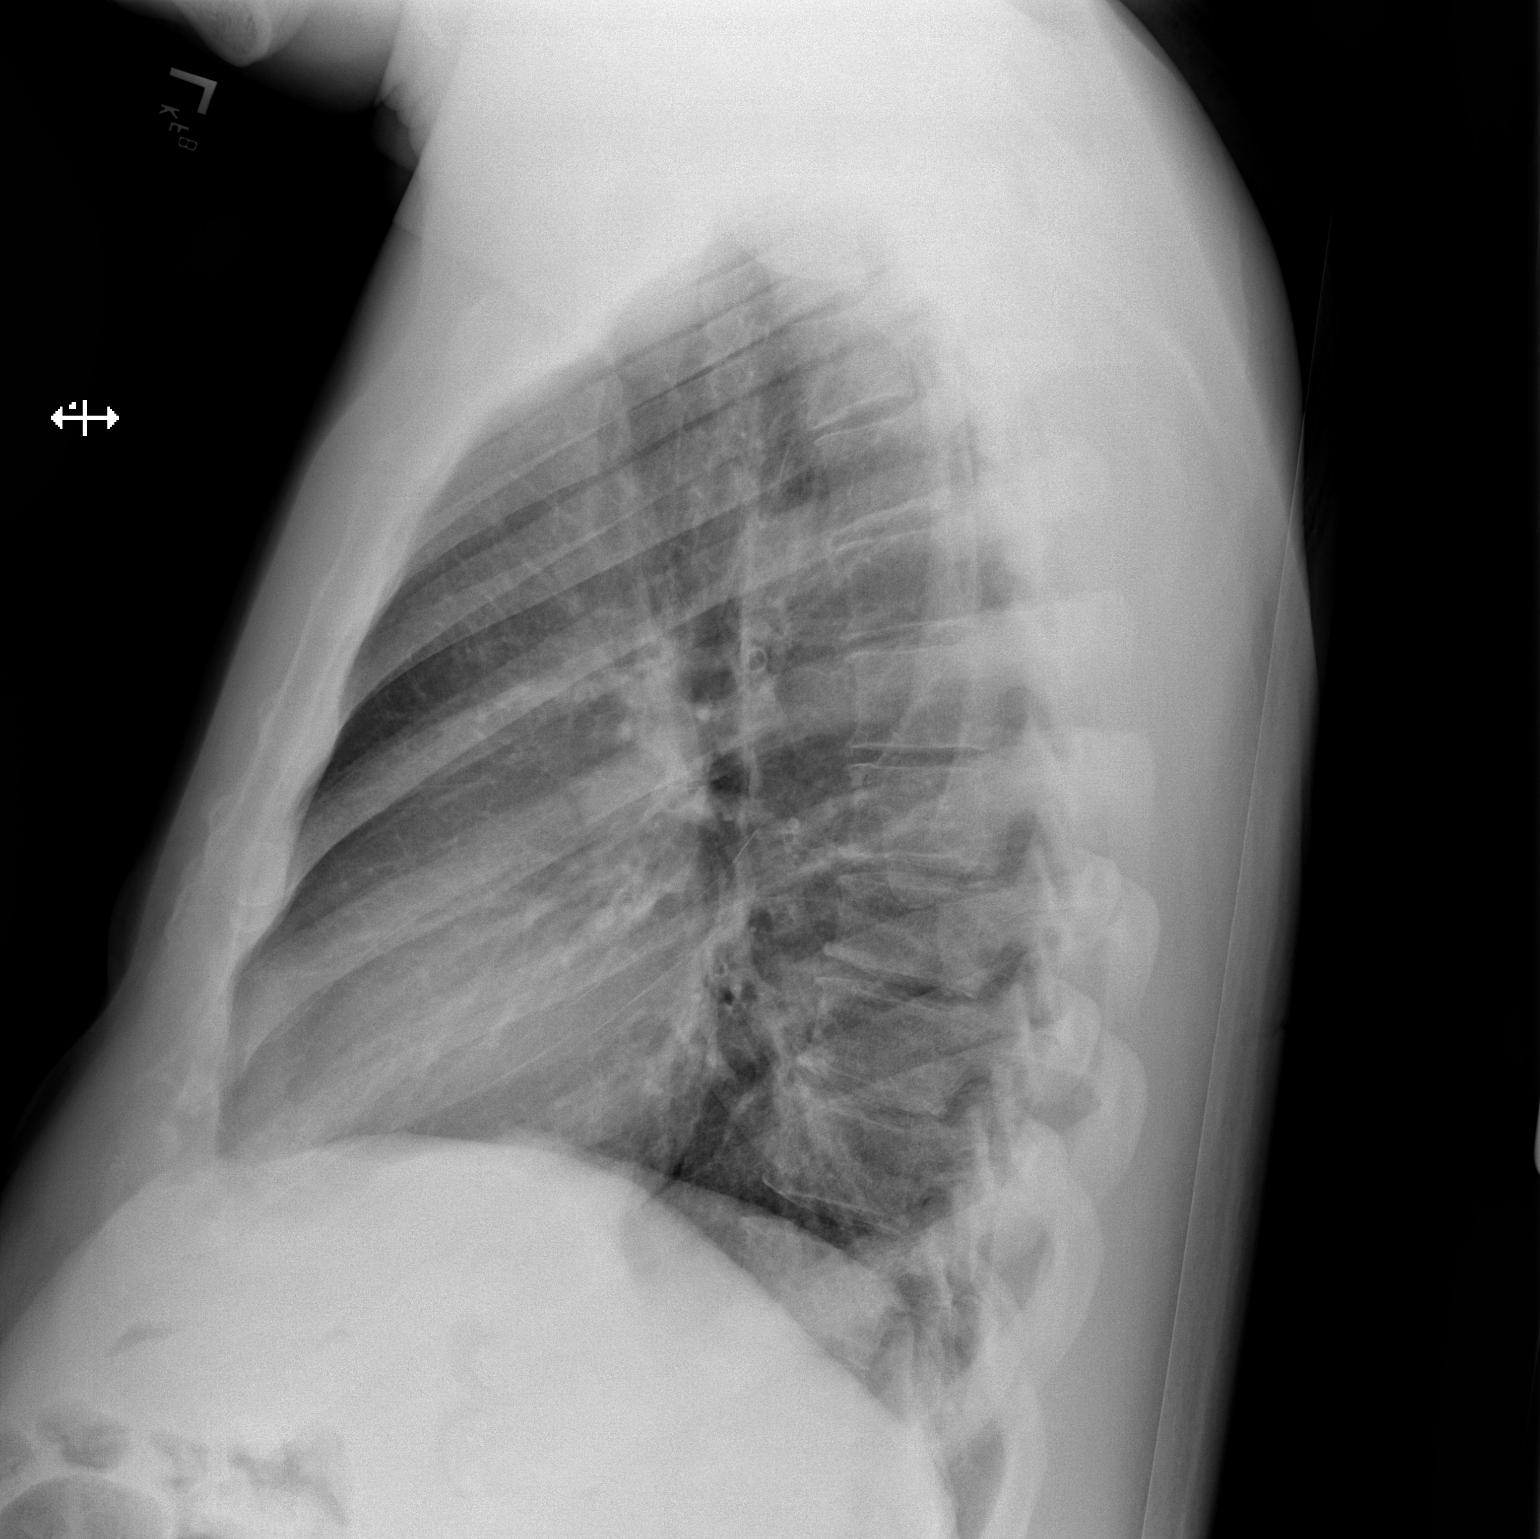

[x chest ap]
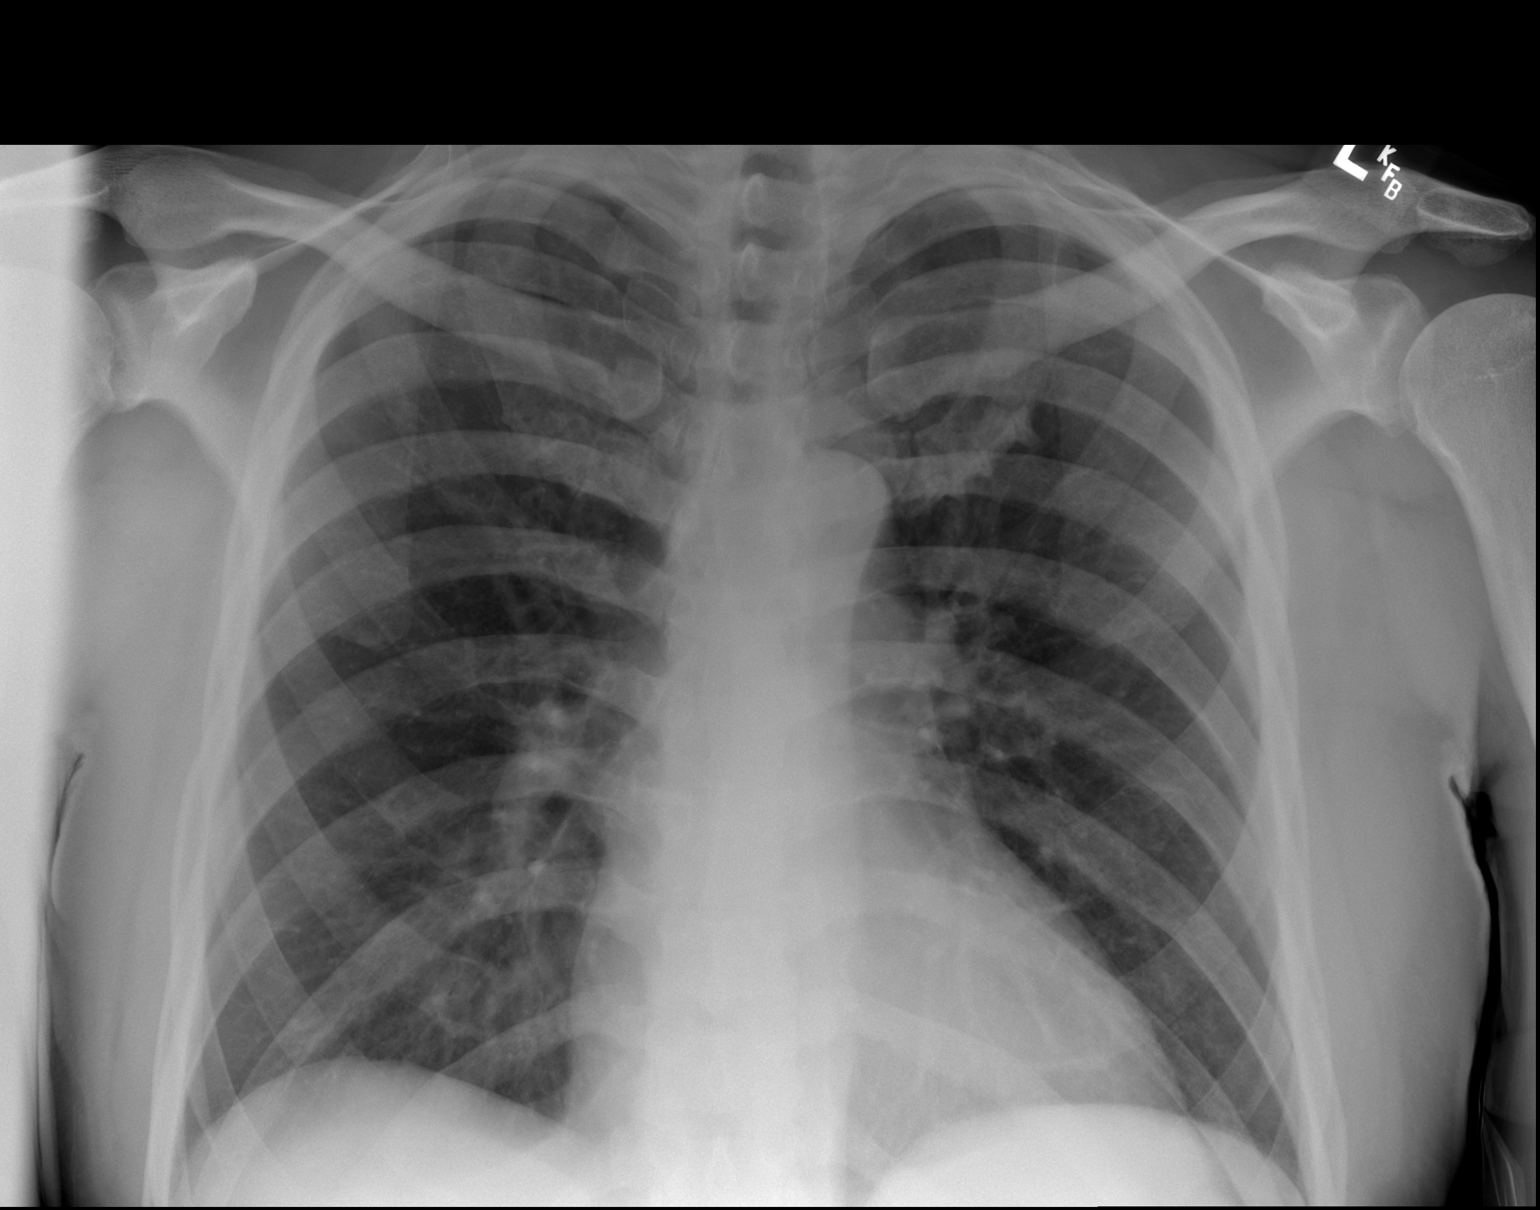

[2 of 2 positions shown; findings below may reference images not displayed]

FINDINGS: The heart size and mediastinal contours are within normal limits.
Both lungs are clear. The visualized skeletal structures are
unremarkable.
IMPRESSION: No active cardiopulmonary disease.

## 2018-10-21 ENCOUNTER — Encounter: Payer: Self-pay | Admitting: Family Medicine

## 2018-10-22 ENCOUNTER — Ambulatory Visit: Payer: Medicaid Other | Admitting: Physical Therapy

## 2018-10-22 ENCOUNTER — Telehealth: Payer: Self-pay | Admitting: Physical Therapy

## 2018-10-22 NOTE — Telephone Encounter (Signed)
LM stating pt missed 3:45 pm appt today and he has no appt on schedule. Pt has 3 additional appt approved until 11-11-18 and he should call [facility] at [place], [facility] Cert[facility] the A[facility] Adult  10/22/18 4:28 PM Phone: [phone] Fax: [phone]

## 2018-10-22 NOTE — Telephone Encounter (Signed)
Patient is requesting Votaren gel for Arthritis

## 2018-10-24 ENCOUNTER — Other Ambulatory Visit: Payer: Self-pay | Admitting: Family Medicine

## 2018-10-24 MED ORDER — DICLOFENAC SODIUM 1 % TD GEL: 4.0000 g | Freq: Four times a day (QID) | TRANSDERMAL | 1 refills | Status: DC

## 2018-11-05 ENCOUNTER — Other Ambulatory Visit: Payer: Self-pay

## 2018-11-05 ENCOUNTER — Encounter: Payer: Self-pay | Admitting: Gastroenterology

## 2018-11-05 ENCOUNTER — Ambulatory Visit: Payer: Medicaid Other | Admitting: Gastroenterology

## 2018-11-05 VITALS — BP 120/80 | HR 96 | Temp 98.3°F | Ht 74.0 in | Wt 240.0 lb

## 2018-11-05 DIAGNOSIS — Z1159 Encounter for screening for other viral diseases: Secondary | ICD-10-CM

## 2018-11-05 DIAGNOSIS — Z1211 Encounter for screening for malignant neoplasm of colon: Secondary | ICD-10-CM

## 2018-11-05 MED ORDER — SUPREP BOWEL PREP KIT 17.5-3.13-1.6 GM/177ML PO SOLN: 1.0000 | ORAL | 0 refills | Status: DC

## 2018-11-05 NOTE — Patient Instructions (Signed)
If you are age 53 or older, your body mass index should be between 23-30. Your Body mass index is 30.81 kg/m. If this is out of the aforementioned range listed, please consider follow up with your Primary Care Provider.  If you are age 51 or younger, your body mass index should be between 19-25. Your Body mass index is 30.81 kg/m. If this is out of the aformentioned range listed, please consider follow up with your Primary Care Provider.    You have been scheduled for a colonoscopy. Please follow written instructions given to you at your visit today.  Please pick up your prep supplies at the pharmacy within the next 1-3 days. If you use inhalers (even only as needed), please bring them with you on the day of your procedure. Your physician has requested that you go to www.startemmi.com and enter the access code given to you at your visit today. This web site gives a general overview about your procedure. However, you should still follow specific instructions given to you by our office regarding your preparation for the procedure.  Thank you for entrusting me with your care and choosing [facility].  Dr Ardis Hughs

## 2018-11-05 NOTE — Progress Notes (Signed)
HPI: This is a very pleasant 53 year old man who was referred to me by Charlott Rakes, MD  to discuss colon cancer screening  Chief complaint is history of colon polyps, family history of colon cancer  His maternal aunt had colon cancer diagnosed sometime in her 51s.  He started having colonoscopies when he was quite young it sounds like.  Initially it was for hemorrhoids but then he tells me that polyps were found even when he was in his 23s.  He believes he has had 3 or 4 colonoscopies over the past 15 or 20 years.  The majority of these were all done in [place] where polyps were removed.  He does not recall the name of the doctor that did them, the name of the hospital where they were done, or really even [place]  He does recall being told that he needed a colonoscopy every few years, possibly every 5 to 7 years.   He has had no overt bleeding, no changes in his bowels.  His weight is overall stable.  No serious abdominal pains    Old Data Reviewed: Labs September 2020 show normal CBC, normal complete metabolic profile      Review of systems: Pertinent positive and negative review of systems were noted in the above HPI section. All other review negative.   Past Medical History:  Diagnosis Date  . Anxiety   . Arthritis   . Bipolar 1 disorder ([place])   . Colon polyps   . Depression   . Hepatitis B   . Human immunodeficiency virus (HIV) ([place])   . Schizophren[facility])     Past Surgical History:  Procedure Laterality Date  . IR FLUORO GUIDE CV LINE RIGHT  02/22/2017  . IR US GUIDE VASC ACCESS RIGHT  02/22/2017  . TUMOR REMOVAL     From Chest    Current Outpatient Medications  Medication Sig Dispense Refill  . BIKTARVY 50-200-25 MG TABS tablet TAKE 1 TABLET BY MOUTH DAILY 30 tablet 3  . diclofenac sodium (VOLTAREN) 1 % GEL Apply 4 g topically 4 (four) times daily. 100 g 1  . hydrOXYzine (ATARAX/VISTARIL) 25 MG tablet  Take 25 mg by mouth 3 (three) times daily as needed.    Marland Kitchen QUEtiapine (SEROQUEL) 200 MG tablet Take 200 mg by mouth at bedtime.    . rosuvastatin (CRESTOR) 20 MG tablet Take 1 tablet (20 mg total) by mouth daily. 30 tablet 11  . sildenafil (VIAGRA) 50 MG tablet Take 1 tablet (50 mg total) by mouth daily as needed for erectile dysfunction. At least 24 hours between doses 10 tablet 1  . thiamine 50 MG tablet Take 1 tablet (50 mg total) by mouth daily. 30 tablet 0  . traZODone (DESYREL) 100 MG tablet Take 2 tablets (200 mg total) by mouth at bedtime. For insomnia. 30 tablet 0   No current facility-administered medications for this visit.     Allergies as of 11/05/2018 - Review Complete 11/05/2018  Allergen Reaction Noted  . Penicillins Other (See Comments) 08/11/2011  . Latex Rash 12/10/2011  . Tape Rash 08/18/2011    Family History  Problem Relation Age of Onset  . CAD Mother   . Diabetes Father   . Colon cancer Maternal Aunt 26  . Diabetes Maternal Aunt   . Heart disease Maternal Uncle     Social History   Socioeconomic History  . Marital status: Married    Spouse name: Not on  file  . Number of children: 1  . Years of education: Not on file  . Highest education level: Not on file  Occupational History  . Not on file  Social Needs  . Financial resource strain: Not on file  . Food insecurity    Worry: Not on file    Inability: Not on file  . Transportation needs    Medical: Not on file    Non-medical: Not on file  Tobacco Use  . Smoking status: Current Every Day Smoker    Packs/day: 0.50    Years: 15.00    Pack years: 7.50    Types: Cigarettes  . Smokeless tobacco: Never Used  . Tobacco comment: has patches when he is ready to quit  Substance and Sexual Activity  . Alcohol use: Yes    Comment: occassional use on weekends  . Drug use: Not Currently    Types: Cocaine    Comment: past  . Sexual activity: Yes    Partners: Female    Comment: condoms given   Lifestyle  . Physical activity    Days per week: Not on file    Minutes per session: Not on file  . Stress: Not on file  Relationships  . Social Herbalist on phone: Not on file    Gets together: Not on file    Attends religious service: Not on file    Active member of club or organization: Not on file    Attends meetings of clubs or organizations: Not on file    Relationship status: Not on file  . Intimate partner violence    Fear of current or ex partner: Not on file    Emotionally abused: Not on file    Physically abused: Not on file    Forced sexual activity: Not on file  Other Topics Concern  . Not on file  Social History Narrative  . Not on file     Physical Exam: BP 120/80 (BP Location: Left Arm, Patient Position: Sitting, Cuff Size: Normal)   Pulse 96   Temp 98.3 F (36.8 C)   Ht 6\' 2"  (1.88 m) Comment: height measured without shoes  Wt 240 lb (108.9 kg)   BMI 30.81 kg/m  Constitutional: generally well-appearing Psychiatric: alert and oriented x3 Eyes: extraocular movements intact Mouth: oral pharynx moist, no lesions Neck: supple no lymphadenopathy Cardiovascular: heart regular rate and rhythm Lungs: clear to auscultation bilaterally Abdomen: soft, nontender, nondistended, no obvious ascites, no peritoneal signs, normal bowel sounds Extremities: no lower extremity edema bilaterally Skin: no lesions on visible extremities   Assessment and plan: 53 y.o. male with single second-degree relative with colon cancer, history of colon polyps.  I explained to him that a single second-degree relative with colon cancer does not necessarily increase his own risk for colon cancer.  We also discussed the fact that there are multiple different kinds of colon polyps and it would be greatly helpful if we knew exactly how many and what type of polyps he has had removed from his colon.  Unfortunately these were done in [place] and he does not recall the doctor,  the hospital, the location of where any of these previous colonoscopies were done.  He does tell me that he had a colonoscopy most recently, about 6 or 7 years ago, at [facility] and it was completely normal.  He is going to search through some of his records to see if he can find any clues as to colonoscopies,  pathology reports relates to any of the polyps that he has had removed.  We will help if needed.  For now going to schedule him for a colonoscopy to be done at his soonest convenience and if it is normal and we have no actual data proving that he has had precancerous polyps removed from his colon then I would likely recommend repeat colonoscopy at 10-year interval.    Please see the "Patient Instructions" section for addition details about the plan.   Owens Loffler, MD [facility] 11/05/2018, 2:49 PM  Cc: Charlott Rakes, MD

## 2018-11-08 ENCOUNTER — Encounter: Payer: Self-pay | Admitting: Family Medicine

## 2018-11-08 DIAGNOSIS — M79671 Pain in right foot: Secondary | ICD-10-CM

## 2018-11-11 ENCOUNTER — Telehealth: Payer: Self-pay

## 2018-11-11 NOTE — Telephone Encounter (Signed)
I left patient a detailed message to call me back. Dr Ardis Hughs wants to know if he has any other way for [place] to get his previous GI records because [facility] cannot locate any. When he calls back I will confirm his information to make sure all is correct.

## 2018-11-19 ENCOUNTER — Ambulatory Visit (INDEPENDENT_AMBULATORY_CARE_PROVIDER_SITE_OTHER): Payer: Medicaid Other | Admitting: Family Medicine

## 2018-11-19 ENCOUNTER — Ambulatory Visit: Payer: Self-pay

## 2018-11-19 ENCOUNTER — Other Ambulatory Visit: Payer: Self-pay

## 2018-11-19 ENCOUNTER — Encounter: Payer: Self-pay | Admitting: Family Medicine

## 2018-11-19 DIAGNOSIS — M79671 Pain in right foot: Secondary | ICD-10-CM

## 2018-11-19 NOTE — Progress Notes (Signed)
Office Visit Note   Patient: Dylan [name]           Date of Birth: 1965/08/03           MRN: [id number] Visit Date: 11/19/2018 Requested by: Charlott Rakes, MD [place],  [place] [id number] PCP: Charlott Rakes, MD  Subjective: Chief Complaint  Patient presents with  . Right Foot - Pain    DOI 04/11/18 Swelling has gotten better in the foot, but pain has always been there.    HPI: He is here with persistent right foot pain.  Is been about 7 months since his injury.  MRI scan in June showed stress reaction at the proximal second metatarsal with avulsion fracture at the base of the second metatarsal.  No ligamentous injury was seen.  He has tried fracture boot and postop shoe.  He continues to have pain with certain activities, pushing off with his foot or pain when for starting to walk after sitting for a while.  It is affecting his ability to work.              ROS: No fevers or chills.  All other systems were reviewed and are negative.  Objective: Vital Signs: There were no vitals taken for this visit.  Physical Exam:  General:  Alert and oriented, in no acute distress. Pulm:  Breathing unlabored. Psy:  Normal mood, congruent affect. Skin: No rash or bruising. Right foot: Slightly tender to palpation between the second and first MTP joints, and also more proximally near the Lisfranc joint.  No pain with manipulation of the midfoot.  No pain with dorsiflexion, plantarflexion or eversion of the foot against resistance or with flexion or extension of the toes.  Imaging: X-rays right foot: Slight widening of the Lisfranc joint which is unchanged.  These were nonweightbearing x-rays.  Previous avulsion fracture is probably healed.  Assessment & Plan: 1.  Chronic right foot pain 48-month status post injury -We will try physical therapy.  If not improved after 4 to 6 weeks, he will contact me and we will order another MRI scan possibly with arthrogram at the Lisfranc  joint.     Procedures: No procedures performed  No notes on file     PMFS History: Patient Active Problem List   Diagnosis Date Noted  . Tobacco abuse 07/10/2018  . ESRD needing dialysis ([place]) 03/03/2018  . Cocaine abuse with cocaine-induced mood disorder ([place]) 05/18/2017  . Schizoaffective disorder, bipolar type ([place])   . Acute renal failure (ARF) ([place])   . Suicidal ideations   . Chronic 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 coma ([place])   . AKI (acute kidney injury) ([place]) 02/19/2017  . Rhabdomyolysis 02/19/2017  . Suicidal overdose ([place]) 02/19/2017  . Acute hepatitis 02/19/2017  . Elevated LFTs   . Homicidal ideations   . Schizoaffective disorder, depressive type ([place]) 09/05/2011  . 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 Degenerative disc disease 08/21/2011  . HIV disease ([place]) 08/17/2011   Past Medical History:  Diagnosis Date  . Anxiety   . Arthritis   . Bipolar 1 disorder ([place])   . Colon polyps   . Depression   . Hepatitis B   . Human immunodeficiency virus (HIV) ([place])   . Schizophrenia ([place])     Family History  Problem Relation Age of Onset  . CAD Mother   . Diabetes Father   . Colon cancer Maternal Aunt 52  . Diabetes  Maternal Aunt   . Heart disease Maternal Uncle     Past Surgical History:  Procedure Laterality Date  . IR FLUORO GUIDE CV LINE RIGHT  02/22/2017  . IR US GUIDE VASC ACCESS RIGHT  02/22/2017  . TUMOR REMOVAL     From Chest   Social History   Occupational History  . Not on file  Tobacco Use  . Smoking status: Current Every Day Smoker    Packs/day: 0.50    Years: 15.00    Pack years: 7.50    Types: Cigarettes  . Smokeless tobacco: Never Used  . Tobacco comment: has patches when he is ready to quit  Substance and Sexual Activity  . Alcohol use: Yes    Comment: occassional use on weekends  . Drug use: Not Currently    Types: Cocaine    Comment: past  . Sexual activity: Yes     Partners: Female    Comment: condoms given

## 2018-11-20 NOTE — Telephone Encounter (Signed)
Dr Ardis Hughs I have called multiple hospitals in [place]. I spoke with [name] and he said all he remembers is that it was on [employer] he thinks and that the hospital went out of business.  I asked him when was he there and he said in the 79's. One of the hospitals I called said they only keep records for 10 years.

## 2018-11-20 NOTE — Telephone Encounter (Signed)
Hospital called back states pt has never been seen there

## 2018-11-21 NOTE — Telephone Encounter (Signed)
Patient informed. 

## 2018-11-21 NOTE — Telephone Encounter (Signed)
Ok, thanks  We will continue with plans for colonoscopy later this month in [place] (already scheduled) and I will advise him about further colon cancer screening or polyp surveillance based on those results.  Thanks

## 2018-11-25 ENCOUNTER — Other Ambulatory Visit: Payer: Self-pay | Admitting: Internal Medicine

## 2018-11-25 DIAGNOSIS — B20 Human immunodeficiency virus [HIV] disease: Secondary | ICD-10-CM

## 2018-12-04 ENCOUNTER — Other Ambulatory Visit: Payer: Self-pay | Admitting: Gastroenterology

## 2018-12-06 LAB — SARS CORONAVIRUS 2 (TAT 6-24 HRS): SARS Coronavirus 2: NEGATIVE

## 2018-12-09 ENCOUNTER — Encounter: Payer: Self-pay | Admitting: Gastroenterology

## 2018-12-09 ENCOUNTER — Ambulatory Visit (AMBULATORY_SURGERY_CENTER): Payer: Medicaid Other | Admitting: Gastroenterology

## 2018-12-09 ENCOUNTER — Other Ambulatory Visit: Payer: Self-pay

## 2018-12-09 VITALS — BP 103/67 | HR 78 | Temp 98.6°F | Resp 15 | Ht 74.0 in | Wt 240.0 lb

## 2018-12-09 DIAGNOSIS — Z86010 Personal history of colonic polyps: Secondary | ICD-10-CM

## 2018-12-09 DIAGNOSIS — Z1211 Encounter for screening for malignant neoplasm of colon: Secondary | ICD-10-CM

## 2018-12-09 MED ORDER — SODIUM CHLORIDE 0.9 % IV SOLN: 500.0000 mL | Freq: Once | INTRAVENOUS | Status: DC

## 2018-12-09 NOTE — Progress Notes (Signed)
To PACU, VSS. Report to [email or web address] 

## 2018-12-09 NOTE — Progress Notes (Signed)
Temp-lc  V/s-cw 

## 2018-12-09 NOTE — Op Note (Signed)
[place] Patient Name: Dylan [name] Procedure Date: 12/09/2018 2:27 PM MRN: [id number] Endoscopist: Milus Banister , MD Age: 53 Referring MD:  Date of Birth: 08/21/1965 Gender: Male Account #: [id number] Procedure:                Colonoscopy Indications:              High risk colon cancer surveillance: Personal                            history of colonic polyps; out of state                            colonoscopies, unable to obtain those records Medicines:                Monitored Anesthesia Care Procedure:                Pre-Anesthesia Assessment:                           - Prior to the procedure, a History and Physical                            was performed, and patient medications and                            allergies were reviewed. The patient's tolerance of                            previous anesthesia was also reviewed. The risks                            and benefits of the procedure and the sedation                            options and risks were discussed with the patient.                            All questions were answered, and informed consent                            was obtained. Prior Anticoagulants: The patient has                            taken no previous anticoagulant or antiplatelet                            agents. ASA Grade Assessment: II - A patient with                            mild systemic disease. After reviewing the risks                            and benefits, the patient was deemed in  satisfactory condition to undergo the procedure.                           After obtaining informed consent, the colonoscope                            was passed under direct vision. Throughout the                            procedure, the patient's blood pressure, pulse, and                            oxygen saturations were monitored continuously. The                            Colonoscope was introduced  through the anus and                            advanced to the the cecum, identified by                            appendiceal orifice and ileocecal valve. The                            colonoscopy was performed without difficulty. The                            patient tolerated the procedure well. The quality                            of the bowel preparation was good. The ileocecal                            valve, appendiceal orifice, and rectum were                            photographed. Scope In: 2:50:49 PM Scope Out: 3:03:25 PM Scope Withdrawal Time: 0 hours 7 minutes 53 seconds  Total Procedure Duration: 0 hours 12 minutes 36 seconds  Findings:                 The entire examined colon appeared normal on direct                            and retroflexion views. Complications:            No immediate complications. Estimated blood loss:                            None. Estimated Blood Loss:     Estimated blood loss: none. Impression:               - The entire examined colon is normal on direct and                            retroflexion views.                           -  No polyps or cancers. Recommendation:           - Patient has a contact number available for                            emergencies. The signs and symptoms of potential                            delayed complications were discussed with the                            patient. Return to normal activities tomorrow.                            Written discharge instructions were provided to the                            patient.                           - Resume previous diet.                           - Continue present medications.                           - Repeat colonoscopy in 10 years for screening. Milus Banister, MD 12/09/2018 3:07:53 PM This report has been signed electronically.

## 2018-12-09 NOTE — Patient Instructions (Signed)
Please, read the handouts given to you by your recovery room nurse.  Thank-you for choosing [place] for your healthcare needs today.  YOU HAD AN ENDOSCOPIC PROCEDURE TODAY AT [facility]:   Refer to the procedure report that was given to you for any specific questions about what was found during the examination.  If the procedure report does not answer your questions, please call your gastroenterologist to clarify.  If you requested that your care partner not be given the details of your procedure findings, then the procedure report has been included in a sealed envelope for you to review at your convenience later.  YOU SHOULD EXPECT: Some feelings of bloating in the abdomen. Passage of more gas than usual.  Walking can help get rid of the air that was put into your GI tract during the procedure and reduce the bloating. If you had a lower endoscopy (such as a colonoscopy or flexible sigmoidoscopy) you may notice spotting of blood in your stool or on the toilet paper. If you underwent a bowel prep for your procedure, you may not have a normal bowel movement for a few days.  Please Note:  You might notice some irritation and congestion in your nose or some drainage.  This is from the oxygen used during your procedure.  There is no need for concern and it should clear up in a day or so.  SYMPTOMS TO REPORT IMMEDIATELY:   Following lower endoscopy (colonoscopy or flexible sigmoidoscopy):  Excessive amounts of blood in the stool  Significant tenderness or worsening of abdominal pains  Swelling of the abdomen that is new, acute  Fever of 100F or higher   For urgent or emergent issues, a gastroenterologist can be reached at any hour by calling [phone].   DIET:  We do recommend a small meal at first, but then you may proceed to your regular diet.  Drink plenty of fluids but you should avoid alcoholic beverages for 24 hours.  ACTIVITY:  You should plan to take it easy for the rest  of today and you should NOT DRIVE or use heavy machinery until tomorrow (because of the sedation medicines used during the test).    FOLLOW UP: Our staff will call the number listed on your records 48-72 hours following your procedure to check on you and address any questions or concerns that you may have regarding the information given to you following your procedure. If we do not reach you, we will leave a message.  We will attempt to reach you two times.  During this call, we will ask if you have developed any symptoms of COVID 19. If you develop any symptoms (ie: fever, flu-like symptoms, shortness of breath, cough etc.) before then, please call [phone].  If you test positive for Covid 19 in the 2 weeks post procedure, please call and report this information to [place].     SIGNATURES/CONFIDENTIALITY: You and/or your care partner have signed paperwork which will be entered into your electronic medical record.  These signatures attest to the fact that that the information above on your After Visit Summary has been reviewed and is understood.  Full responsibility of the confidentiality of this discharge information lies with you and/or your care-partner.

## 2018-12-11 ENCOUNTER — Telehealth: Payer: Self-pay

## 2018-12-11 NOTE — Telephone Encounter (Signed)
  Follow up Call-  Call back number 12/09/2018  Post procedure Call Back phone  # [phone]  Permission to leave phone message Yes  Some recent data might be hidden     Patient questions:  Do you have a fever, pain , or abdominal swelling? No. Pain Score  0 *  Have you tolerated food without any problems? Yes.    Have you been able to return to your normal activities? Yes.    Do you have any questions about your discharge instructions: Diet   No. Medications  No. Follow up visit  No.  Do you have questions or concerns about your Care? No.  Actions: * If pain score is 4 or above: No action needed, pain <4.  1. Have you developed a fever since your procedure? No  2.   Have you had an respiratory symptoms (SOB or cough) since your procedure? No  3.   Have you tested positive for COVID 19 since your procedure No  4.   Have you had any family members/close contacts diagnosed with the COVID 19 since your procedure?  No   If yes to any of these questions please route to Joylene John, RN and Alphonsa Gin, RN.

## 2018-12-17 ENCOUNTER — Encounter: Payer: Self-pay | Admitting: Family Medicine

## 2019-01-09 ENCOUNTER — Other Ambulatory Visit: Payer: Self-pay

## 2019-01-09 ENCOUNTER — Ambulatory Visit: Payer: Medicaid Other | Attending: Family Medicine | Admitting: Physical Therapy

## 2019-01-09 DIAGNOSIS — M6281 Muscle weakness (generalized): Secondary | ICD-10-CM | POA: Diagnosis present

## 2019-01-09 DIAGNOSIS — M79671 Pain in right foot: Secondary | ICD-10-CM

## 2019-01-09 DIAGNOSIS — R2689 Other abnormalities of gait and mobility: Secondary | ICD-10-CM | POA: Diagnosis present

## 2019-01-09 DIAGNOSIS — M25671 Stiffness of right ankle, not elsewhere classified: Secondary | ICD-10-CM | POA: Diagnosis present

## 2019-01-09 NOTE — Therapy (Signed)
[place], [name], [address] Phone: [phone]   Fax:  [phone]  Physical Therapy Evaluation  Patient Details  Name: Dylan [name] MRN: [id number] Date of Birth: [birth year] Referring Provider (PT): Eunice Blase, MD   Encounter Date: 01/09/2019  PT End of Session - 01/09/19 1523    Visit Number  1    Number of Visits  8    Date for PT Re-Evaluation  03/06/19   resubmit MCD after 4th visit   Authorization Type  MCD    PT Start Time  1216    PT Stop Time  1300    PT Time Calculation (min)  44 min    Activity Tolerance  Patient tolerated treatment well    Behavior During Therapy  [facility] for tasks assessed/performed       Past Medical History:  Diagnosis Date  . Anxiety   . Arthritis   . Bipolar 1 disorder ([place])   . Colon polyps   . Depression   . GERD (gastroesophageal reflux disease)   . Hepatitis B   . Human immunodeficiency virus (HIV) ([place])   . Hyperlipidemia   . Hypertension   . Schizophrenia [facility])     Past Surgical History:  Procedure Laterality Date  . IR FLUORO GUIDE CV LINE RIGHT  02/22/2017  . IR US GUIDE VASC ACCESS RIGHT  02/22/2017  . TUMOR REMOVAL     From Chest    There were no vitals filed for this visit.   Subjective Assessment - 01/09/19 1222    Subjective  Patient reports he broke his foot back in April when he fell off a ladder. He was in a boot for about a month. He still has swelling on top of the foot and pain with walking and standing extended periods. He was in therapy a few months for his left knee and it is doing well.    Pertinent History  arthritis, bipolar, depression, HIV, schizophrenia    Limitations  Standing;Walking;House hold activities    How long can you sit comfortably?  No limitation    How long can you stand comfortably?  45 minutes -1 hour    How long can you walk comfortably?  1 hour    Diagnostic tests  X-ray, MRI    Patient Stated Goals   Patient reports he wants to get back to speed walking    Currently in Pain?  Yes    Pain Score  7     Pain Location  Foot    Pain Orientation  Right    Pain Descriptors / Indicators  Stabbing    Pain Type  Chronic pain    Pain Onset  More than a month ago    Pain Frequency  Constant    Aggravating Factors   Standing, walking    Pain Relieving Factors  Ankle exercises (has not tried ice or heat to this point)    Effect of Pain on Daily Activities  Patient is limited at work and with exercise         [facility] PT Assessment - 01/09/19 0001      Assessment   Medical Diagnosis  Right foot pain    Referring Provider (PT)  Eunice Blase, MD    Onset Date/Surgical Date  04/11/18    Hand Dominance  Right    Next MD Visit  Not scheduled    Prior Therapy  Yes- left knee      Precautions  Precautions  None      Restrictions   Weight Bearing Restrictions  No      Balance Screen   Has the patient fallen in the past 6 months  No    Has the patient had a decrease in activity level because of a fear of falling?   No    Is the patient reluctant to leave their home because of a fear of falling?   No      Prior Function   Level of Independence  Independent      Cognition   Overall Cognitive Status  Within Functional Limits for tasks assessed      Observation/Other Assessments   Observations  Patient appears in no apparent distress    Focus on Therapeutic Outcomes (FOTO)   NA- MCD    Other Surveys   Lower Extremity Functional Scale    Lower Extremity Functional Scale   33      Sensation   Light Touch  Appears Intact      Posture/Postural Control   Posture Comments  Patient stands with left foot turned out greater than right, greater arch collapse on left with flexible pes planus      ROM / Strength   AROM / PROM / Strength  AROM;Strength      AROM   AROM Assessment Site  Ankle    Right/Left Ankle  Left    Left Ankle Dorsiflexion  4    Left Ankle Plantar Flexion  45    Left  Ankle Inversion  25    Left Ankle Eversion  10      Strength   Strength Assessment Site  Ankle    Right/Left Ankle  Left    Left Ankle Dorsiflexion  4+/5    Left Ankle Plantar Flexion  3-/5   secondary to pain   Left Ankle Inversion  4/5    Left Ankle Eversion  4/5      Flexibility   Soft Tissue Assessment /Muscle Length  yes   gastroc/soleus     Palpation   Palpation comment  TTP between 1st and 2nd metatarsal on left, Lisfranc region      Transfers   Transfers  Independent with all Transfers      Ambulation/Gait   Ambulation/Gait  Yes    Ambulation/Gait Assistance  7: Independent    Gait Comments  Patient exhibits greater toe out on left, increased foot pronation                Objective measurements completed on examination: See above findings.      [facility] Adult PT Treatment/Exercise - 01/09/19 0001      Exercises   Exercises  Ankle      Ankle Exercises: Stretches   Soleus Stretch  2 reps;20 seconds    Soleus Stretch Limitations  standing, cued to keep foot point straight forward    Gastroc Stretch  2 reps;20 seconds    Gastroc Stretch Limitations  standing, cued to keep foot point straight forward      Ankle Exercises: Seated   Towel Crunch  --   20 reps   Towel Crunch Limitations  cued for technique    Heel Raises  15 reps   ball squeeze between heels   Heel Raises Limitations  cued for technique with tennis ball    Other Seated Ankle Exercises  Ankle inversion (post. tib) with yellow band sitting in figure-4 position x10      Ankle Exercises: Supine  T-Band  4-way with red band x12 each - cued for technique             PT Education - 01/09/19 1350    Education Details  Exam findings, POC, HEP    Person(s) Educated  Patient    Methods  Explanation;Demonstration;Tactile cues;Verbal cues;Handout    Comprehension  Verbalized understanding;Returned demonstration;Verbal cues required;Tactile cues required;Need further instruction        PT Short Term Goals - 01/09/19 1519      PT SHORT TERM GOAL #1   Title  Pt will be independent in his HEP and progression.    Baseline  initial HEP issued today    Time  4    Period  Weeks    Status  New    Target Date  02/06/19      PT SHORT TERM GOAL #2   Title  Patient will be able to perform double leg heel raise with </= 2-3/10 pain level to indicate improved strength and allow for better walking ability    Baseline  Unable    Time  4    Period  Weeks    Status  New    Target Date  02/06/19      PT SHORT TERM GOAL #3   Title  Patien will exhibit improve DF AROM to >/=8/10 deg to allow for improve walking ability    Baseline  4 deg    Time  4    Period  Weeks    Status  New    Target Date  02/06/19      PT SHORT TERM GOAL #4   Title  Patient will report improvement in functional level to >/= 40/80 LEFS.    Baseline  33/80    Time  4    Period  Weeks    Status  New    Target Date  02/06/19        PT Long Term Goals - 01/09/19 1522      PT LONG TERM GOAL #1   Title  Establish at re-authorization             Plan - 01/09/19 1352    Clinical Impression Statement  Patient presents to PT with report of chronic left foot pain that occured when he fractured his 2nd metatarsal after falling off a ladder and landing on his flat foot. He continues to have pain mostly around the lisfranc region with pressing down such as raising up on toes and he exhibits strength motion deficits that are contributing to gait deviations. He would benefit from continued skilled PT to address his impairments focused on improving ankle motion ands strength/arch control and allowing him to return to walking without pain or limitation.    Personal Factors and Comorbidities  Time since onset of injury/illness/exacerbation;Comorbidity 3+    Comorbidities  arthritis, bipolar, depression, HIV, schizophrenia, left knee pain    Examination-Activity Limitations  Locomotion Level     Examination-Participation Restrictions  Community Activity;Driving;Yard Work    Stability/Clinical Decision Making  Stable/Uncomplicated    Clinical Decision Making  Low    Rehab Potential  Good    PT Frequency  1x / week    PT Duration  8 weeks    PT Treatment/Interventions  Cryotherapy;Electrical Stimulation;Moist Heat;Gait training;Stair training;Balance training;Therapeutic exercise;Therapeutic activities;Functional mobility training;Neuromuscular re-education;Patient/family education;Manual techniques;Taping;Passive range of motion;Dry needling;ADLs/Self Care Home Management;Joint Manipulations;Vasopneumatic Device    PT Next Visit Plan  Assess HEP and progress PRN, calf stretching, ankle  and foot intrinsic/post. tib strengthening, progress to heel raises as able    PT Home Exercise Plan  Access Code: [email or web address]; 4-way banded ankle with red, seated heel raises with tennis ball squeeze between heels, towel scrunches, seated inversion with yellow in figure-4, standing gastroc/soleus stretch    Consulted and Agree with Plan of Care  Patient       Patient will benefit from skilled therapeutic intervention in order to improve the following deficits and impairments:  Abnormal gait, Difficulty walking, Decreased activity tolerance, Pain, Decreased balance, Impaired flexibility, Decreased strength, Postural dysfunction  Visit Diagnosis: Pain in right foot  Stiffness of right ankle, not elsewhere classified  Muscle weakness (generalized)  Other abnormalities of gait and mobility     Problem List Patient Active Problem List   Diagnosis Date Noted  . Tobacco abuse 07/10/2018  . ESRD needing dialysis ([place]) 03/03/2018  . Cocaine abuse with cocaine-induced mood disorder ([place]) 05/18/2017  . Schizoaffective disorder, bipolar type ([place])   . Acute renal failure (ARF) ([place])   . Suicidal ideations   . Chronic 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 coma ([place])   . AKI (acute kidney  injury) ([place]) 02/19/2017  . Rhabdomyolysis 02/19/2017  . Suicidal overdose ([place]) 02/19/2017  . Acute hepatitis 02/19/2017  . Elevated LFTs   . Homicidal ideations   . Schizoaffective disorder, depressive type ([place]) 09/05/2011  . 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 Degenerative disc disease 08/21/2011  . HIV disease ([place]) 08/17/2011    Hilda Blades, PT, DPT, LAT, ATC 01/09/19  3:27 PM Phone: [phone] Fax: [facility] [address] [place], [name], [address] Phone: [phone]   Fax:  [phone]  Name: Dylan [name] MRN: [id number] Date of Birth: Jun 12, 1965

## 2019-01-09 NOTE — Patient Instructions (Signed)
Access Code: [email or web address]  URL: [email or web address]  Date: 01/09/2019  Prepared by: Hilda Blades   Exercises Gastroc Stretch on Wall - 2 reps - 30 seconds hold - 2-3x daily Soleus Stretch on Wall - 2 reps - 30 seconds hold - 2-3x daily Long Sitting Ankle Dorsiflexion with Anchored Resistance - 12 reps - 2 sets - 1x daily Long Sitting Ankle Plantar Flexion with Resistance - 12 reps - 2 sets - 1x daily Long Sitting Ankle Inversion with Resistance - 12 reps - 2 sets - 1x daily Long Sitting Ankle Eversion with Resistance - 12 reps - 2 sets - 1x daily Seated Heel Raise - 15 reps - 2 sets - 1x daily Seated Figure 4 Ankle Inversion with Resistance - 10 reps - 2 sets  - 1x daily Towel Scrunches - 20 reps - 3 sets - 1x daily

## 2019-01-19 ENCOUNTER — Encounter: Payer: Self-pay | Admitting: Family Medicine

## 2019-01-23 ENCOUNTER — Encounter: Payer: Self-pay | Admitting: Physical Therapy

## 2019-01-23 ENCOUNTER — Other Ambulatory Visit: Payer: Self-pay

## 2019-01-23 ENCOUNTER — Ambulatory Visit: Payer: Medicaid Other | Attending: Family Medicine | Admitting: Physical Therapy

## 2019-01-23 DIAGNOSIS — M79671 Pain in right foot: Secondary | ICD-10-CM | POA: Diagnosis present

## 2019-01-23 DIAGNOSIS — M25671 Stiffness of right ankle, not elsewhere classified: Secondary | ICD-10-CM | POA: Diagnosis present

## 2019-01-23 DIAGNOSIS — M6281 Muscle weakness (generalized): Secondary | ICD-10-CM | POA: Insufficient documentation

## 2019-01-23 DIAGNOSIS — R2689 Other abnormalities of gait and mobility: Secondary | ICD-10-CM | POA: Insufficient documentation

## 2019-01-23 NOTE — Patient Instructions (Signed)
Access Code: [email or web address]  URL: [email or web address]  Date: 01/23/2019  Prepared by: Hilda Blades   Exercises Gastroc Stretch on Wall - 2 reps - 30 seconds hold - 2-3x daily Soleus Stretch on Wall - 2 reps - 30 seconds hold - 2-3x daily Long Sitting Ankle Dorsiflexion with Anchored Resistance - 12 reps - 2 sets - 1x daily Long Sitting Ankle Plantar Flexion with Resistance - 12 reps - 2 sets - 1x daily Long Sitting Ankle Inversion with Resistance - 12 reps - 2 sets - 1x daily Long Sitting Ankle Eversion with Resistance - 12 reps - 2 sets - 1x daily Standing Heel Raise - 10 reps - 3 sets - 1x daily Seated Heel Raise - 15 reps - 2 sets - 1x daily Seated Figure 4 Ankle Inversion with Resistance - 10 reps - 2 sets - 1x daily Towel Scrunches - 20 reps - 3 sets - 1x daily

## 2019-01-23 NOTE — Therapy (Signed)
[place], [name], [address] Phone: [phone]   Fax:  [phone]  Physical Therapy Treatment  Patient Details  Name: Dylan [name] MRN: [id number] Date of Birth: 1965-06-21 Referring Provider (PT): Eunice Blase, MD   Encounter Date: 01/23/2019  PT End of Session - 01/23/19 1456    Visit Number  2    Number of Visits  8    Date for PT Re-Evaluation  03/06/19    Authorization Type  MCD    PT Start Time  [id number]    PT Stop Time  1527    PT Time Calculation (min)  42 min    Activity Tolerance  Patient tolerated treatment well    Behavior During Therapy  [facility] for tasks assessed/performed       Past Medical History:  Diagnosis Date  . Anxiety   . Arthritis   . Bipolar 1 disorder ([place])   . Colon polyps   . Depression   . GERD (gastroesophageal reflux disease)   . Hepatitis B   . Human immunodeficiency virus (HIV) ([place])   . Hyperlipidemia   . Hypertension   . Schizophrenia [facility])     Past Surgical History:  Procedure Laterality Date  . IR FLUORO GUIDE CV LINE RIGHT  02/22/2017  . IR US GUIDE VASC ACCESS RIGHT  02/22/2017  . TUMOR REMOVAL     From Chest    There were no vitals filed for this visit.  Subjective Assessment - 01/23/19 1447    Subjective  Patient reports his right foot is still about the same. He notes that he still has trouble with walking and when he hits the brakes in his car.    Currently in Pain?  Yes    Pain Score  4    states earlier today it was a 7/10   Pain Location  Foot    Pain Orientation  Right    Pain Descriptors / Indicators  Stabbing    Pain Type  Chronic pain    Pain Onset  More than a month ago    Pain Frequency  Constant                       [facility] Adult PT Treatment/Exercise - 01/23/19 0001      Exercises   Exercises  Ankle      Manual Therapy   Manual Therapy  Taping    Manual therapy comments  Arch taping on right using leukotape       Ankle Exercises: Stretches   Soleus Stretch  2 reps;20 seconds    Soleus Stretch Limitations  standing    Gastroc Stretch  2 reps;20 seconds    Gastroc Stretch Limitations  standing      Ankle Exercises: Seated   Towel Crunch  --   2x20   BAPS  Level 3    BAPS Limitations  DF, PF, Inv, Ev, circles cw, ccw      Ankle Exercises: Supine   T-Band  4-way with green x15 each - VC for technique             PT Education - 01/23/19 1450    Education Details  HEP, pronation control insoles for arch support    Person(s) Educated  Patient    Methods  Explanation;Demonstration;Verbal cues;Handout    Comprehension  Verbalized understanding;Returned demonstration;Verbal cues required;Need further instruction       PT Short Term Goals - 01/09/19  [place] #1   Title  Pt will be independent in his HEP and progression.    Baseline  initial HEP issued today    Time  4    Period  Weeks    Status  New    Target Date  02/06/19      PT SHORT TERM GOAL #2   Title  Patient will be able to perform double leg heel raise with </= 2-3/10 pain level to indicate improved strength and allow for better walking ability    Baseline  Unable    Time  4    Period  Weeks    Status  New    Target Date  02/06/19      PT SHORT TERM GOAL #3   Title  Patien will exhibit improve DF AROM to >/=8/10 deg to allow for improve walking ability    Baseline  4 deg    Time  4    Period  Weeks    Status  New    Target Date  02/06/19      PT SHORT TERM GOAL #4   Title  Patient will report improvement in functional level to >/= 40/80 LEFS.    Baseline  33/80    Time  4    Period  Weeks    Status  New    Target Date  02/06/19        PT Long Term Goals - 01/09/19 1522      PT LONG TERM GOAL #1   Title  Establish at re-authorization            Plan - 01/23/19 1528    Clinical Impression Statement  Patient continues to report stabbing pain in midfoot region with weight  bearing. He responded well to the arch taping this visit and was given options for arch support insoles in order to control excessive pronation on right side. His strengthening was progressed this visit with good tolerance. He would benefit from continued skilled PT to progress his arch control and ankle strength/mobility to reduce pain with weight bearing tasks.    PT Treatment/Interventions  Cryotherapy;Electrical Stimulation;Moist Heat;Gait training;Stair training;Balance training;Therapeutic exercise;Therapeutic activities;Functional mobility training;Neuromuscular re-education;Patient/family education;Manual techniques;Taping;Passive range of motion;Dry needling;ADLs/Self Care Home Management;Joint Manipulations;Vasopneumatic Device    PT Next Visit Plan  Assess HEP and progress PRN, calf stretching, ankle and foot intrinsic/post. tib strengthening, progress to heel raises as able    PT Home Exercise Plan  Access Code: [email or web address]; 4-way banded ankle with red, standing and seated heel raises with tennis ball squeeze between heels, towel scrunches, seated inversion with yellow in figure-4, standing gastroc/soleus stretch    Consulted and Agree with Plan of Care  Patient       Patient will benefit from skilled therapeutic intervention in order to improve the following deficits and impairments:  Abnormal gait, Difficulty walking, Decreased activity tolerance, Pain, Decreased balance, Impaired flexibility, Decreased strength, Postural dysfunction  Visit Diagnosis: Pain in right foot  Stiffness of right ankle, not elsewhere classified  Muscle weakness (generalized)  Other abnormalities of gait and mobility     Problem List Patient Active Problem List   Diagnosis Date Noted  . Tobacco abuse 07/10/2018  . ESRD needing dialysis ([place]) 03/03/2018  . Cocaine abuse with cocaine-induced mood disorder ([place]) 05/18/2017  . Schizoaffective disorder, bipolar type ([place])   . Acute renal failure (ARF)  ([place])   . Suicidal ideations   . Chronic  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 coma ([place])   . AKI (acute kidney injury) ([place]) 02/19/2017  . Rhabdomyolysis 02/19/2017  . Suicidal overdose ([place]) 02/19/2017  . Acute hepatitis 02/19/2017  . Elevated LFTs   . Homicidal ideations   . Schizoaffective disorder, depressive type ([place]) 09/05/2011  . 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 Degenerative disc disease 08/21/2011  . HIV disease ([place]) 08/17/2011    Hilda Blades, PT, DPT, LAT, ATC 01/23/19  3:45 PM Phone: [phone] Fax: [facility] [address] [place], [name], [address] Phone: [phone]   Fax:  [phone]  Name: Dylan [name] MRN: [id number] Date of Birth: Jun 30, 1965

## 2019-01-27 ENCOUNTER — Ambulatory Visit: Payer: Medicaid Other | Admitting: Physical Therapy

## 2019-01-27 ENCOUNTER — Other Ambulatory Visit: Payer: Self-pay

## 2019-01-27 ENCOUNTER — Encounter: Payer: Self-pay | Admitting: Physical Therapy

## 2019-01-27 DIAGNOSIS — R2689 Other abnormalities of gait and mobility: Secondary | ICD-10-CM

## 2019-01-27 DIAGNOSIS — M79671 Pain in right foot: Secondary | ICD-10-CM | POA: Diagnosis not present

## 2019-01-27 DIAGNOSIS — M6281 Muscle weakness (generalized): Secondary | ICD-10-CM

## 2019-01-27 DIAGNOSIS — M25671 Stiffness of right ankle, not elsewhere classified: Secondary | ICD-10-CM

## 2019-01-27 NOTE — Therapy (Signed)
[place], [name], [address] Phone: [phone]   Fax:  [phone]  Physical Therapy Treatment  Patient Details  Name: Dylan [name] MRN: [id number] Date of Birth: 1965/08/25 Referring Provider (PT): Eunice Blase, MD   Encounter Date: 01/27/2019  PT End of Session - 01/27/19 1406    Visit Number  3    Number of Visits  8    Date for PT Re-Evaluation  03/06/19    Authorization Type  MCD    Authorization Time Period  01/23/2019-02/12/2019    Authorization - Visit Number  2    Authorization - Number of Visits  3    PT Start Time  1400    PT Stop Time  [id number]    PT Time Calculation (min)  42 min    Activity Tolerance  Patient tolerated treatment well    Behavior During Therapy  [facility] for tasks assessed/performed       Past Medical History:  Diagnosis Date  . Anxiety   . Arthritis   . Bipolar 1 disorder ([place])   . Colon polyps   . Depression   . GERD (gastroesophageal reflux disease)   . Hepatitis B   . Human immunodeficiency virus (HIV) ([place])   . Hyperlipidemia   . Hypertension   . Schizophrenia [facility])     Past Surgical History:  Procedure Laterality Date  . IR FLUORO GUIDE CV LINE RIGHT  02/22/2017  . IR US GUIDE VASC ACCESS RIGHT  02/22/2017  . TUMOR REMOVAL     From Chest    There were no vitals filed for this visit.  Subjective Assessment - 01/27/19 1404    Subjective  Patient reports he feels his foot is trying to get better. The tape from last visit helped and allowed his foot feel more stable.    Currently in Pain?  Yes    Pain Score  2     Pain Location  Foot    Pain Orientation  Right    Pain Descriptors / Indicators  Stabbing    Pain Type  Chronic pain    Pain Onset  More than a month ago    Pain Frequency  Constant                       [facility] Adult PT Treatment/Exercise - 01/27/19 0001      Neuro Re-ed    Neuro Re-ed Details   Single leg and tandem stance on  Airex      Exercises   Exercises  Ankle      Manual Therapy   Manual Therapy  Taping    Manual therapy comments  Arch taping on right using leukotape      Ankle Exercises: Stretches   Gastroc Stretch  3 reps;30 seconds    Gastroc Stretch Limitations  slant board      Ankle Exercises: Standing   Heel Raises  10 reps   4 sets   Heel Raises Limitations  2 sets flat surface, 2 sets on step; ball squeeze between heels             PT Education - 01/27/19 1406    Education Details  HEP, insoles, balance    Person(s) Educated  Patient    Methods  Explanation;Demonstration;Verbal cues    Comprehension  Verbalized understanding;Returned demonstration;Verbal cues required;Need further instruction       PT Short Term Goals - 01/09/19 1519  PT SHORT TERM GOAL #1   Title  Pt will be independent in his HEP and progression.    Baseline  initial HEP issued today    Time  4    Period  Weeks    Status  New    Target Date  02/06/19      PT SHORT TERM GOAL #2   Title  Patient will be able to perform double leg heel raise with </= 2-3/10 pain level to indicate improved strength and allow for better walking ability    Baseline  Unable    Time  4    Period  Weeks    Status  New    Target Date  02/06/19      PT SHORT TERM GOAL #3   Title  Patien will exhibit improve DF AROM to >/=8/10 deg to allow for improve walking ability    Baseline  4 deg    Time  4    Period  Weeks    Status  New    Target Date  02/06/19      PT SHORT TERM GOAL #4   Title  Patient will report improvement in functional level to >/= 40/80 LEFS.    Baseline  33/80    Time  4    Period  Weeks    Status  New    Target Date  02/06/19        PT Long Term Goals - 01/09/19 1522      PT LONG TERM GOAL #1   Title  Establish at re-authorization            Plan - 01/27/19 1407    Clinical Impression Statement  Patient is progressing well with his strengthening exercises and continues to report  benefit with arch taping to reduce pain and improve support. Today's visit incorporated more balance traning as patient exhibits impaired balance bilaterally. He did not report any increased pain this visit. Patient would benefit from continued skilled PT to progress his ankle strength and control.    PT Treatment/Interventions  Cryotherapy;Electrical Stimulation;Moist Heat;Gait training;Stair training;Balance training;Therapeutic exercise;Therapeutic activities;Functional mobility training;Neuromuscular re-education;Patient/family education;Manual techniques;Taping;Passive range of motion;Dry needling;ADLs/Self Care Home Management;Joint Manipulations;Vasopneumatic Device    PT Next Visit Plan  Assess HEP and progress PRN, calf stretching, ankle and foot intrinsic/post. tib strengthening, progress to heel raises as able    PT Home Exercise Plan  Access Code: [email or web address]; 4-way banded ankle with red, standing and seated heel raises with tennis ball squeeze between heels, towel scrunches, seated inversion with yellow in figure-4, standing gastroc/soleus stretch    Consulted and Agree with Plan of Care  Patient       Patient will benefit from skilled therapeutic intervention in order to improve the following deficits and impairments:  Abnormal gait, Difficulty walking, Decreased activity tolerance, Pain, Decreased balance, Impaired flexibility, Decreased strength, Postural dysfunction  Visit Diagnosis: Pain in right foot  Stiffness of right ankle, not elsewhere classified  Muscle weakness (generalized)  Other abnormalities of gait and mobility     Problem List Patient Active Problem List   Diagnosis Date Noted  . Tobacco abuse 07/10/2018  . ESRD needing dialysis ([place]) 03/03/2018  . Cocaine abuse with cocaine-induced mood disorder ([place]) 05/18/2017  . Schizoaffective disorder, bipolar type ([place])   . Acute renal failure (ARF) ([place])   . Suicidal ideations   . Chronic viral hepatitis B without  delta agent and without coma ([place])   . AKI (acute kidney injury) ([place])  02/19/2017  . Rhabdomyolysis 02/19/2017  . Suicidal overdose ([place]) 02/19/2017  . Acute hepatitis 02/19/2017  . Elevated LFTs   . Homicidal ideations   . Schizoaffective disorder, depressive type ([place]) 09/05/2011  . 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 Degenerative disc disease 08/21/2011  . HIV disease ([place]) 08/17/2011    Hilda Blades, PT, DPT, LAT, ATC 01/27/19  3:17 PM Phone: [phone] Fax: [facility] [address] [place], [name], [address] Phone: [phone]   Fax:  [phone]  Name: Dylan [name] MRN: [id number] Date of Birth: 14-Dec-1965

## 2019-01-27 NOTE — Patient Instructions (Signed)
Access Code: [email or web address]  URL: [email or web address]  Date: 01/27/2019  Prepared by: Hilda Blades   Exercises Gastroc Stretch on Wall - 2 reps - 30 seconds hold - 2-3x daily Soleus Stretch on Wall - 2 reps - 30 seconds hold - 2-3x daily Long Sitting Ankle Dorsiflexion with Anchored Resistance - 12 reps - 2 sets - 1x daily Long Sitting Ankle Plantar Flexion with Resistance - 12 reps - 2 sets - 1x daily Long Sitting Ankle Inversion with Resistance - 12 reps - 2 sets - 1x daily Long Sitting Ankle Eversion with Resistance - 12 reps - 2 sets - 1x daily Standing Heel Raise - 10 reps - 3 sets - 1x daily Seated Heel Raise - 15 reps - 2 sets - 1x daily Seated Figure 4 Ankle Inversion with Resistance - 10 reps - 2 sets  - 1x daily Towel Scrunches - 20 reps - 3 sets - 1x daily Standing Tandem Balance with Counter Support - 3 sets - 30 seconds hold - 1x daily

## 2019-02-03 ENCOUNTER — Ambulatory Visit: Payer: Medicaid Other | Admitting: Physical Therapy

## 2019-02-10 ENCOUNTER — Other Ambulatory Visit: Payer: Self-pay

## 2019-02-10 ENCOUNTER — Ambulatory Visit: Payer: Medicaid Other | Attending: Family Medicine | Admitting: Physical Therapy

## 2019-02-10 ENCOUNTER — Encounter: Payer: Self-pay | Admitting: Physical Therapy

## 2019-02-10 DIAGNOSIS — M6281 Muscle weakness (generalized): Secondary | ICD-10-CM

## 2019-02-10 DIAGNOSIS — M25671 Stiffness of right ankle, not elsewhere classified: Secondary | ICD-10-CM | POA: Diagnosis present

## 2019-02-10 DIAGNOSIS — R2689 Other abnormalities of gait and mobility: Secondary | ICD-10-CM | POA: Diagnosis present

## 2019-02-10 DIAGNOSIS — M79671 Pain in right foot: Secondary | ICD-10-CM

## 2019-02-10 NOTE — Therapy (Addendum)
[place], [name], [address] Phone: [phone]   Fax:  [phone]  Physical Therapy Treatment / Discharge  Patient Details  Name: Dylan [name] MRN: [id number] Date of Birth: 1965/02/19 Referring Provider (PT): Eunice Blase, MD   Encounter Date: 02/10/2019    Past Medical History:  Diagnosis Date  . Anxiety   . Arthritis   . Bipolar 1 disorder ([place])   . Colon polyps   . Depression   . GERD (gastroesophageal reflux disease)   . Hepatitis B   . Human immunodeficiency virus (HIV) ([place])   . Hyperlipidemia   . Hypertension   . Schizophrenia [facility])     Past Surgical History:  Procedure Laterality Date  . IR FLUORO GUIDE CV LINE RIGHT  02/22/2017  . IR US GUIDE VASC ACCESS RIGHT  02/22/2017  . TUMOR REMOVAL     From Chest    There were no vitals filed for this visit.                              PT Short Term Goals - 02/10/19 1436      PT SHORT TERM GOAL #1   Title  Pt will be independent in his HEP and progression.    Baseline  initial HEP issued today    Time  4    Period  Weeks    Status  On-going    Target Date  02/06/19      PT SHORT TERM GOAL #2   Title  Patient will be able to perform double leg heel raise with </= 2-3/10 pain level to indicate improved strength and allow for better walking ability    Baseline  Able to perform but reports 4/10 pain level    Time  4    Period  Weeks    Status  Partially Met    Target Date  02/06/19      PT SHORT TERM GOAL #3   Title  Patien will exhibit improve DF AROM to >/=8/10 deg to allow for improve walking ability    Baseline  6 deg    Time  4    Period  Weeks    Status  Partially Met    Target Date  02/06/19      PT SHORT TERM GOAL #4   Title  Patient will report improvement in functional level to >/= 40/80 LEFS.    Baseline  36/80    Time  4    Period  Weeks    Status  Partially Met    Target Date   02/06/19        PT Long Term Goals - 01/09/19 1522      PT LONG TERM GOAL #1   Title  Establish at re-authorization              Patient will benefit from skilled therapeutic intervention in order to improve the following deficits and impairments:  Abnormal gait, Difficulty walking, Decreased activity tolerance, Pain, Decreased balance, Impaired flexibility, Decreased strength, Postural dysfunction  Visit Diagnosis: Pain in right foot  Stiffness of right ankle, not elsewhere classified  Muscle weakness (generalized)  Other abnormalities of gait and mobility     Problem List Patient Active Problem List   Diagnosis Date Noted  . Tobacco abuse 07/10/2018  . ESRD needing dialysis ([place]) 03/03/2018  . Cocaine abuse with cocaine-induced mood disorder ([place]) 05/18/2017  .  Schizoaffective disorder, bipolar type ([place])   . Acute renal failure (ARF) ([place])   . Suicidal ideations   . Chronic 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 coma ([place])   . AKI (acute kidney injury) ([place]) 02/19/2017  . Rhabdomyolysis 02/19/2017  . Suicidal overdose ([place]) 02/19/2017  . Acute hepatitis 02/19/2017  . Elevated LFTs   . Homicidal ideations   . Schizoaffective disorder, depressive type ([place]) 09/05/2011  . 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 Degenerative disc disease 08/21/2011  . HIV disease ([place]) 08/17/2011   PHYSICAL THERAPY DISCHARGE SUMMARY  Visits from Start of Care: 4  Current functional level related to goals / functional outcomes: See above   Remaining deficits: See above   Education / Equipment: HEP Plan: Patient agrees to discharge.  Patient goals were not met. Patient is being discharged due to not returning since the last visit.  ?????      Hilda Blades, PT, DPT, LAT, ATC 05/14/19  8:43 AM Phone: [phone] Fax: [place] [facility] [address] [place], [name], [address] Phone: [phone]   Fax:  [phone]  Name: YOHANNES WAIBEL MRN: [id number] Date of Birth: Apr 25, 1965

## 2019-02-13 ENCOUNTER — Encounter: Payer: Self-pay | Admitting: Family Medicine

## 2019-02-13 ENCOUNTER — Ambulatory Visit: Payer: Medicaid Other | Attending: Family Medicine | Admitting: Family Medicine

## 2019-02-13 ENCOUNTER — Other Ambulatory Visit: Payer: Self-pay

## 2019-02-13 ENCOUNTER — Ambulatory Visit: Payer: Medicaid Other

## 2019-02-13 DIAGNOSIS — E7800 Pure hypercholesterolemia, unspecified: Secondary | ICD-10-CM

## 2019-02-13 DIAGNOSIS — G629 Polyneuropathy, unspecified: Secondary | ICD-10-CM | POA: Diagnosis not present

## 2019-02-13 DIAGNOSIS — S92324D Nondisplaced fracture of second metatarsal bone, right foot, subsequent encounter for fracture with routine healing: Secondary | ICD-10-CM | POA: Diagnosis not present

## 2019-02-13 DIAGNOSIS — N528 Other male erectile dysfunction: Secondary | ICD-10-CM

## 2019-02-13 MED ORDER — GABAPENTIN 300 MG PO CAPS: 300.0000 mg | ORAL_CAPSULE | Freq: Two times a day (BID) | ORAL | 3 refills | Status: DC

## 2019-02-13 MED ORDER — SILDENAFIL CITRATE 50 MG PO TABS: 50.0000 mg | ORAL_TABLET | Freq: Every day | ORAL | 1 refills | Status: DC | PRN

## 2019-02-13 NOTE — Progress Notes (Signed)
Virtual Visit via Telephone Note  I connected with Dylan [name], on 02/13/2019 at 8:41 AM by telephone due to the COVID-19 pandemic and verified that I am speaking with the correct person using two identifiers.   Consent: I discussed the limitations, risks, security and privacy concerns of performing an evaluation and management service by telephone and the availability of in person appointments. I also discussed with the patient that there may be a patient responsible charge related to this service. The patient expressed understanding and agreed to proceed.   Location of Patient: Work  Biomedical scientist of Provider: Clinic   Persons participating in Telemedicine visit: Dylan [name]-CMA Dr. Margarita [name]     History of Present Illness: Dylan [name] is a 54 year old male with a history of HIV (on antiretroviral therapy), hypertension, schizoaffective disorder, chronic left knee pain, right second metatarsal fracture who presents today for a follow-up visit.  PT has not been too beneficial with regards to the pain in his R foot and he is waiting to see his Orthopedic for a follow up. He does have neuropathy in his toes and this is asscoiated with pain and has been present since 2019. He is wondering if this is related to his antiretroviral medications  He is compliant with his statin and is requesting a refill. He is  followed by Infectious disease for management of his HIV.  Past Medical History:  Diagnosis Date  . Anxiety   . Arthritis   . Bipolar 1 disorder ([place])   . Colon polyps   . Depression   . GERD (gastroesophageal reflux disease)   . Hepatitis B   . Human immunodeficiency virus (HIV) ([place])   . Hyperlipidemia   . Hypertension   . Schizophrenia ([place])    Allergies  Allergen Reactions  . Penicillins Other (See Comments)    Childhood allergy  . Latex Rash  . Tape Rash    Current Outpatient Medications on File Prior to Visit  Medication Sig Dispense  Refill  . BIKTARVY 50-200-25 MG TABS tablet TAKE 1 TABLET BY MOUTH DAILY 30 tablet 3  . diclofenac sodium (VOLTAREN) 1 % GEL Apply 4 g topically 4 (four) times daily. 100 g 1  . hydrOXYzine (ATARAX/VISTARIL) 25 MG tablet Take 25 mg by mouth 3 (three) times daily as needed.    Marland Kitchen QUEtiapine (SEROQUEL) 200 MG tablet Take 200 mg by mouth at bedtime.    . rosuvastatin (CRESTOR) 20 MG tablet Take 1 tablet (20 mg total) by mouth daily. 30 tablet 11  . sildenafil (VIAGRA) 50 MG tablet Take 1 tablet (50 mg total) by mouth daily as needed for erectile dysfunction. At least 24 hours between doses 10 tablet 1  . traZODone (DESYREL) 100 MG tablet Take 2 tablets (200 mg total) by mouth at bedtime. For insomnia. 30 tablet 0  . thiamine 50 MG tablet Take 1 tablet (50 mg total) by mouth daily. (Patient not taking: Reported on 12/09/2018) 30 tablet 0   Current Facility-Administered Medications on File Prior to Visit  Medication Dose Route Frequency Provider Last Rate Last Admin  . 0.9 %  sodium chloride infusion  500 mL Intravenous Once Milus Banister, MD        Observations/Objective: Alert, awake, oriented x3 Not in acute distress  Lab Results  Component Value Date   HGBA1C 5.7 (H) 06/20/2018   Lipid Panel     Component Value Date/Time   CHOL 158 02/13/2019 1128   TRIG 112 02/13/2019 1128  HDL 41 02/13/2019 1128   CHOLHDL 3.9 02/13/2019 1128   CHOLHDL 6.8 (H) 06/06/2018 1406   VLDL 34 12/13/2011 0620   LDLCALC 97 02/13/2019 1128   LDLCALC 149 (H) 06/06/2018 1406   LABVLDL 20 02/13/2019 1128     Assessment and Plan:  1. Other male erectile dysfunction Stable - sildenafil (VIAGRA) 50 MG tablet; Take 1 tablet (50 mg total) by mouth daily as needed for erectile dysfunction. At least 24 hours between doses  Dispense: 10 tablet; Refill: 1  2. Neuropathy Uncontrolled Increased Gabapentin dose Will evaluate for diabetes and also check B12 level - gabapentin (NEURONTIN) 300 MG capsule; Take  1 capsule (300 mg total) by mouth 2 (two) times daily.  Dispense: 60 capsule; Refill: 3 - Vitamin B12 - Hemoglobin A1c  3. Closed nondisplaced fracture of second metatarsal bone of right foot with routine healing, subsequent encounter Uncontrolled PT has been ineffective Follow up with Orthopedics  4. Hypercholesterolemia Controlled Counseled on low cholesterol diet - CMP14+EGFR - Lipid panel    I discussed the assessment and treatment plan with the patient. The patient was provided an opportunity to ask questions and all were answered. The patient agreed with the plan and demonstrated an understanding of the instructions.   The patient was advised to call back or seek an in-person evaluation if the symptoms worsen or if the condition fails to improve as anticipated.     I provided 16 minutes total of non-face-to-face time during this encounter including median intraservice time, reviewing previous notes, investigations, ordering medications, medical decision making, coordinating care and patient verbalized understanding at the end of the visit.     Charlott Rakes, MD, FAAFP. [facility] and [place] [facility], [place]   02/13/2019, 8:41 AM

## 2019-02-13 NOTE — Progress Notes (Signed)
Patient has been called and DOB has been verified. Patient has been screened and transferred to PCP to start phone visit.    Numbness and pain in toes. Refill for Viagra to [facility] on [address].

## 2019-02-14 LAB — CMP14+EGFR
ALT: 62 IU/L — ABNORMAL HIGH (ref 0–44)
AST: 46 IU/L — ABNORMAL HIGH (ref 0–40)
Albumin/Globulin Ratio: 1.6 (ref 1.2–2.2)
Albumin: 4.6 g/dL (ref 3.8–4.9)
Alkaline Phosphatase: 85 IU/L (ref 39–117)
BUN/Creatinine Ratio: 14 (ref 9–20)
BUN: 18 mg/dL (ref 6–24)
Bilirubin Total: 0.3 mg/dL (ref 0.0–1.2)
CO2: 18 mmol/L — ABNORMAL LOW (ref 20–29)
Calcium: 9.8 mg/dL (ref 8.7–10.2)
Chloride: 105 mmol/L (ref 96–106)
Creatinine, Ser: 1.25 mg/dL (ref 0.76–1.27)
GFR calc Af Amer: 76 mL/min/{1.73_m2} (ref >59)
GFR calc non Af Amer: 65 mL/min/{1.73_m2} (ref >59)
Globulin, Total: 2.8 g/dL (ref 1.5–4.5)
Glucose: 93 mg/dL (ref 65–99)
Potassium: 4.5 mmol/L (ref 3.5–5.2)
Sodium: 137 mmol/L (ref 134–144)
Total Protein: 7.4 g/dL (ref 6.0–8.5)

## 2019-02-14 LAB — HEMOGLOBIN A1C
Est. average glucose Bld gHb Est-mCnc: 128 mg/dL
Hgb A1c MFr Bld: 6.1 % — ABNORMAL HIGH (ref 4.8–5.6)

## 2019-02-14 LAB — LIPID PANEL
Chol/HDL Ratio: 3.9 ratio (ref 0.0–5.0)
Cholesterol, Total: 158 mg/dL (ref 100–199)
HDL: 41 mg/dL (ref >39)
LDL Chol Calc (NIH): 97 mg/dL (ref 0–99)
Triglycerides: 112 mg/dL (ref 0–149)
VLDL Cholesterol Cal: 20 mg/dL (ref 5–40)

## 2019-02-14 LAB — VITAMIN B12: Vitamin B-12: 464 pg/mL (ref 232–1245)

## 2019-03-01 IMAGING — CR DG CHEST 2V
2 series · 2 of 2 positions shown · non-contrast
Comparison: 04/21/2012

CLINICAL DATA: 51-year-old male with a history of renal failure and
leukocytosis

EXAM:
CHEST  2 VIEW

[w chest lat]
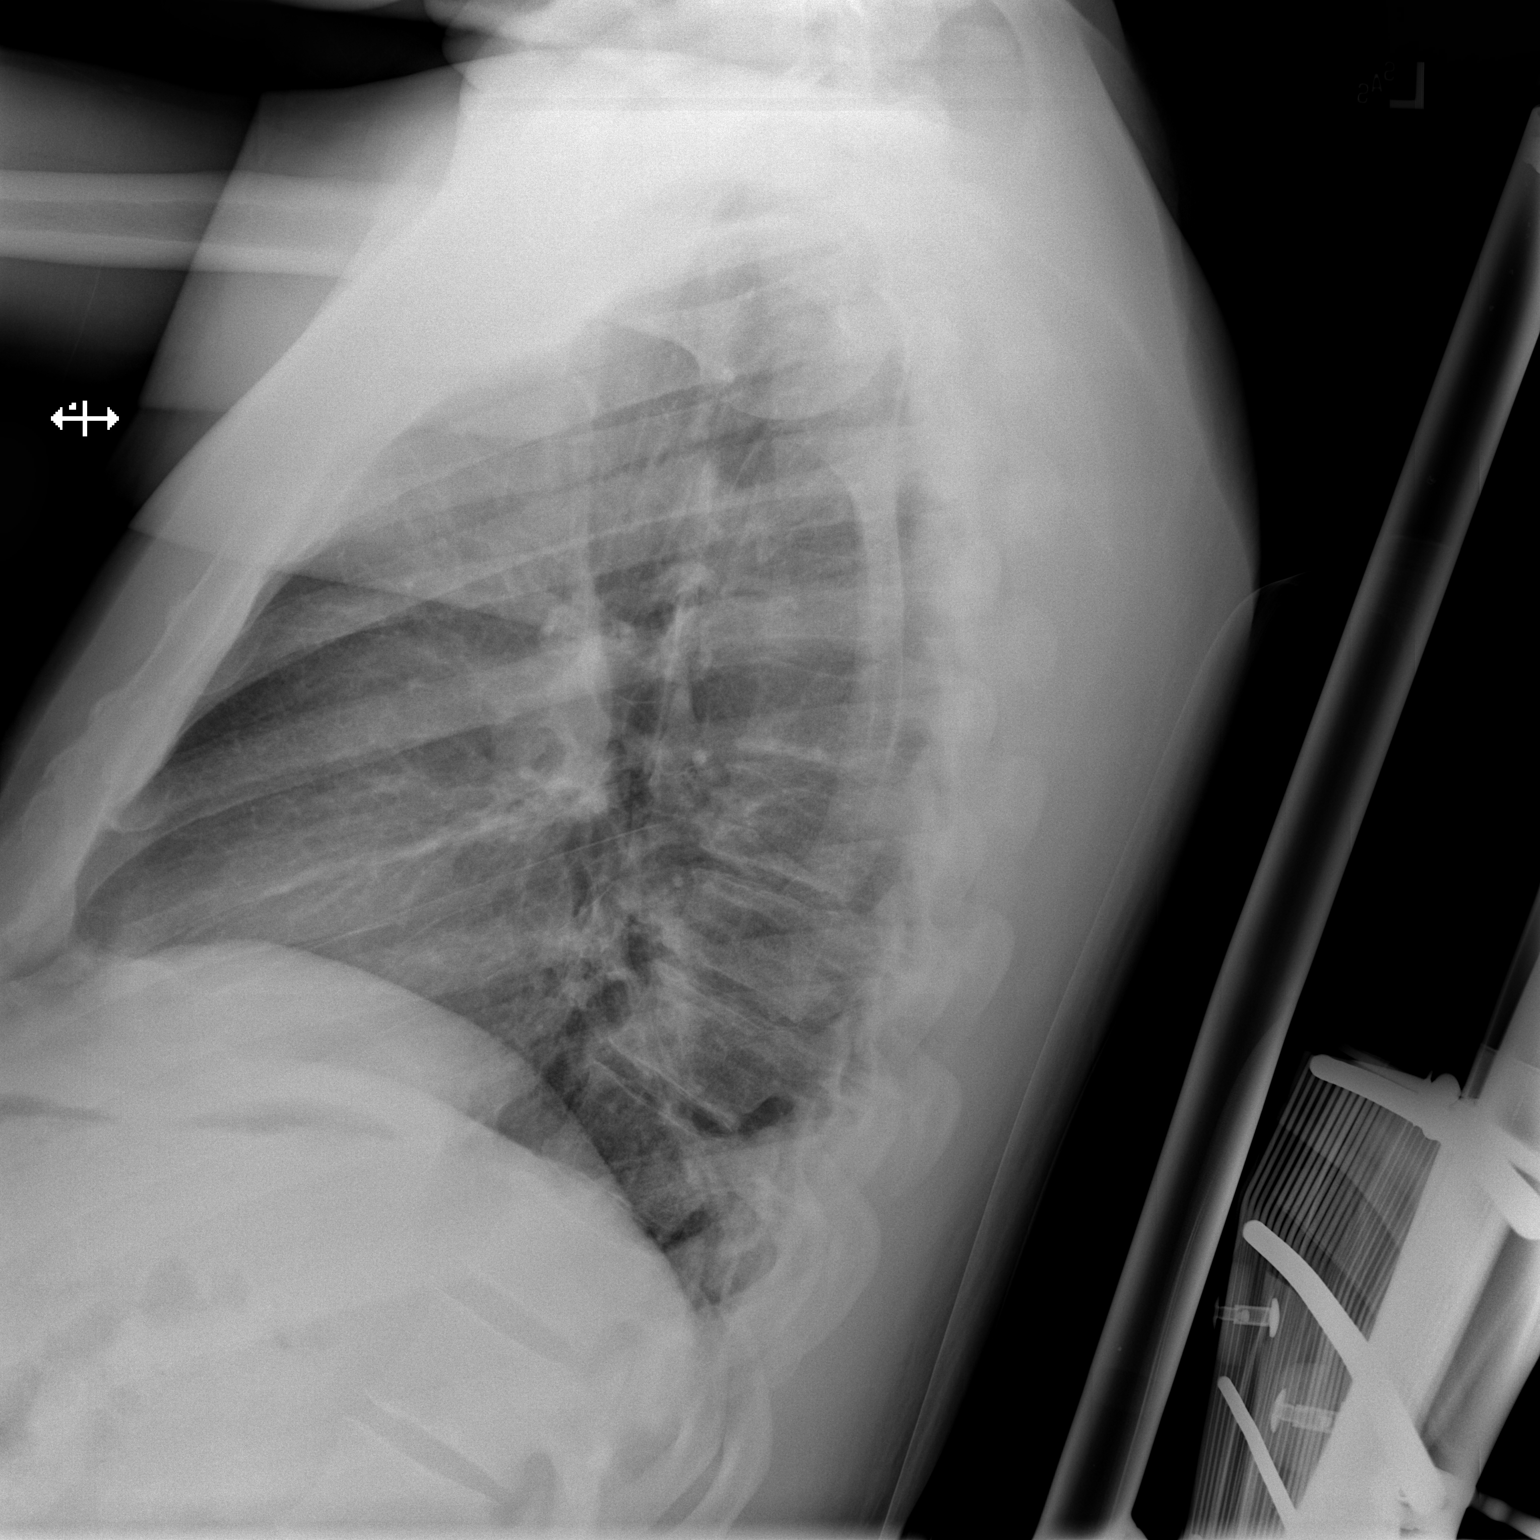

[x chest ap]
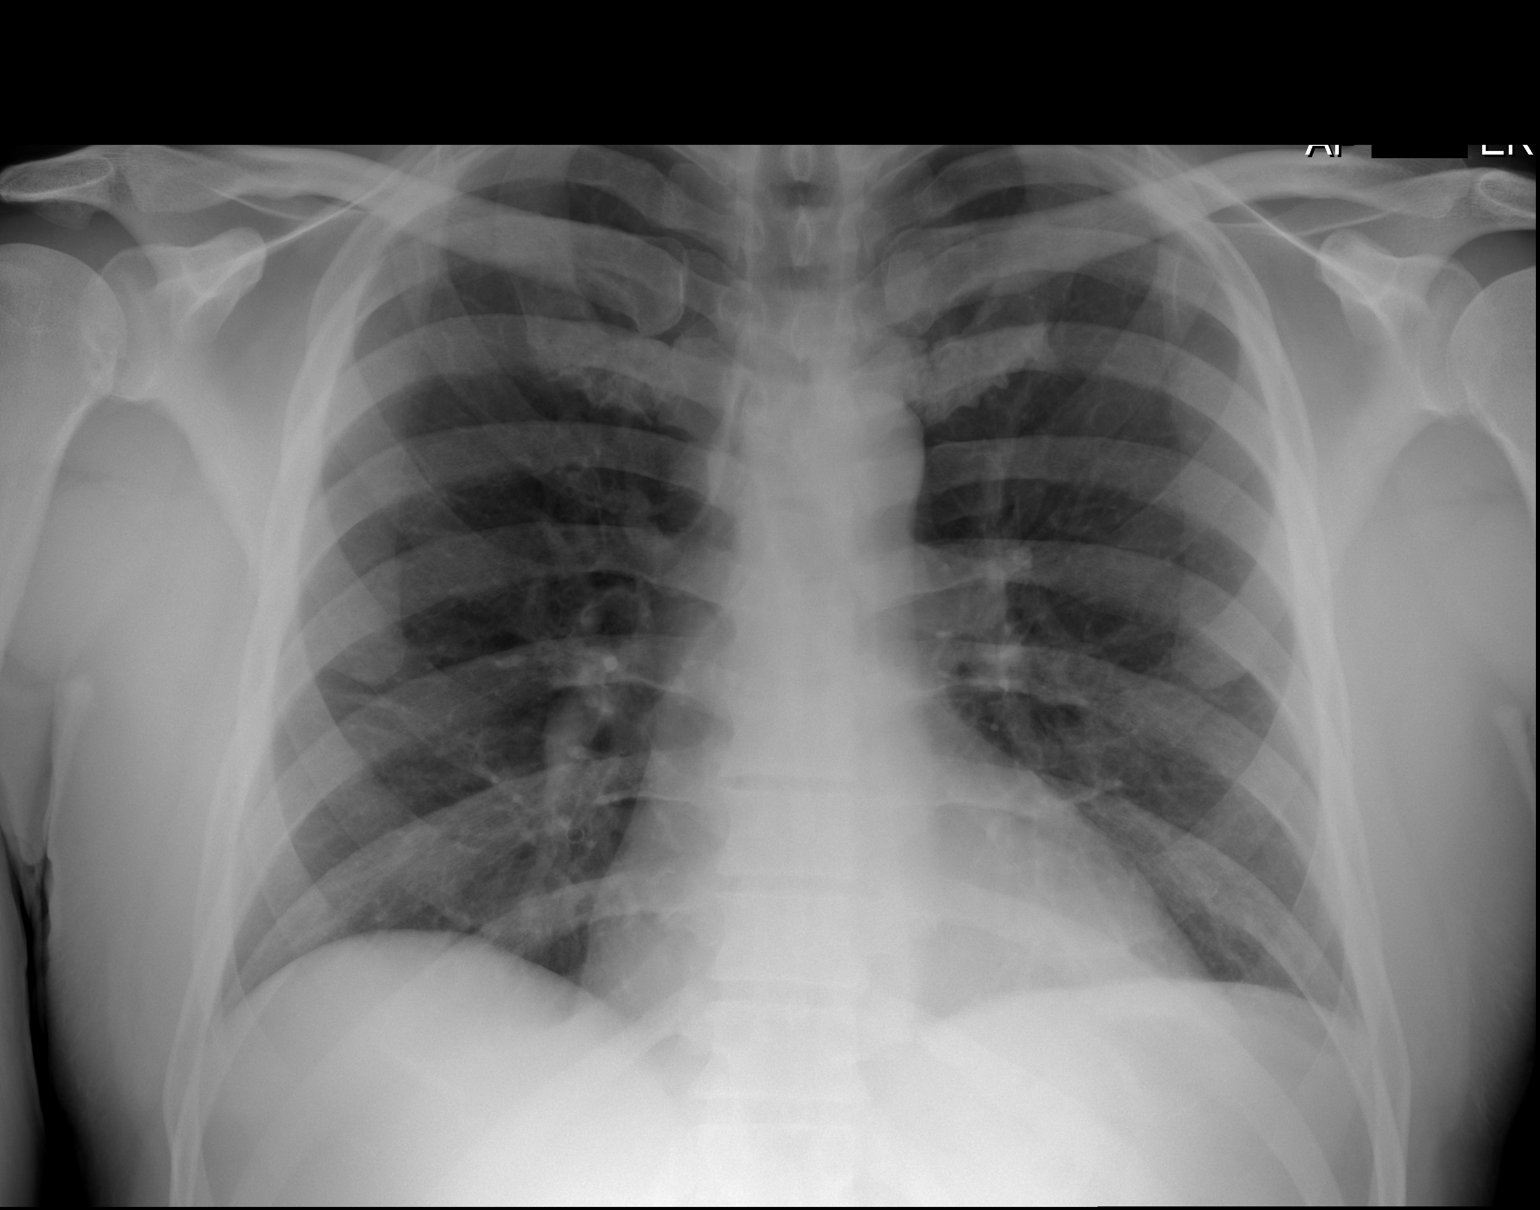

[2 of 2 positions shown; findings below may reference images not displayed]

FINDINGS: Cardiomediastinal silhouette unchanged in size and contour. No
evidence of central vascular congestion. No pneumothorax or pleural
effusion. No confluent airspace disease. Coarsened interstitial
markings.

No displaced fracture
IMPRESSION: Chronic lung changes without evidence of superimposed acute
cardiopulmonary disease.

## 2019-03-04 IMAGING — XA IR FLUORO GUIDE CV LINE*R*
1 series · 1 of 1 positions shown · non-contrast
Comparison: none

INDICATION: 51-year-old male with a history of renal disease

[Series 1: fl angio · 1 of 1 slices shown]
[im 1/1]
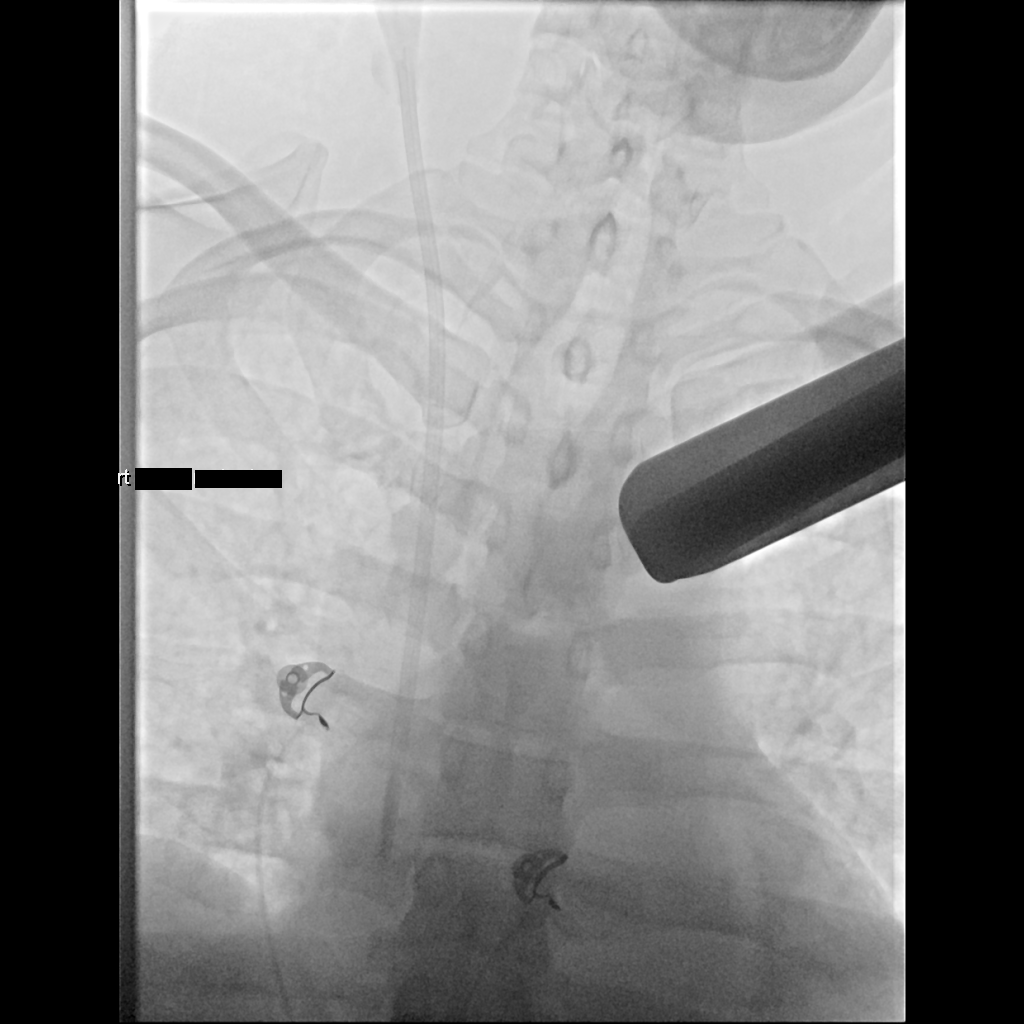

[1 of 1 positions shown; findings below may reference images not displayed]

EXAM:
IMAGE GUIDED TEMPORARY HEMODIALYSIS CATHETER PLACEMENT

MEDICATIONS:
None

ANESTHESIA/SEDATION:
None

FLUOROSCOPY TIME:  Fluoroscopy Time: 0 minutes 6 seconds (3 mGy).

COMPLICATIONS:
None

PROCEDURE:
Informed written consent was obtained from the patient after a
discussion of the risks, benefits, and alternatives to treatment.
Questions regarding the procedure were encouraged and answered. The
right neck was prepped with chlorhexidine in a sterile fashion, and
a sterile drape was applied covering the operative field. Maximum
barrier sterile technique with sterile gowns and gloves were used
for the procedure. A timeout was performed prior to the initiation
of the procedure.

A micropuncture kit was utilized to access the right internal
jugular vein under direct, real-time ultrasound guidance after the
overlying soft tissues were anesthetized with 1% lidocaine with
epinephrine. Ultrasound image documentation was performed. The
microwire was kinked to measure appropriate catheter length. A stiff
glidewire was advanced to the level of the IVC. A 20 cm hemodialysis
catheter was then placed over the wire.

Final catheter positioning was confirmed and documented with a spot
radiographic image. The catheter aspirates and flushes normally. The
catheter was flushed with appropriate volume heparin dwells.

Dressings were applied. The patient tolerated the procedure well
without immediate post procedural complication.
IMPRESSION: Status post placement of right IJ temporary hemodialysis catheter.
Catheter may be converted if need be.

## 2019-03-13 ENCOUNTER — Ambulatory Visit (INDEPENDENT_AMBULATORY_CARE_PROVIDER_SITE_OTHER)
Admission: RE | Admit: 2019-03-13 | Discharge: 2019-03-13 | Disposition: A | Discharge status: Home / Self Care | Payer: Medicaid Other | Source: Ambulatory Visit

## 2019-03-13 ENCOUNTER — Other Ambulatory Visit: Payer: Self-pay

## 2019-03-13 DIAGNOSIS — R112 Nausea with vomiting, unspecified: Secondary | ICD-10-CM | POA: Diagnosis not present

## 2019-03-13 DIAGNOSIS — R1084 Generalized abdominal pain: Secondary | ICD-10-CM | POA: Diagnosis not present

## 2019-03-13 MED ORDER — DICYCLOMINE HCL 20 MG PO TABS: 20.0000 mg | ORAL_TABLET | Freq: Two times a day (BID) | ORAL | 0 refills | Status: DC

## 2019-03-13 MED ORDER — ONDANSETRON 4 MG PO TBDP: 4.0000 mg | ORAL_TABLET | Freq: Three times a day (TID) | ORAL | 0 refills | Status: DC | PRN

## 2019-03-13 NOTE — ED Provider Notes (Signed)
Virtual Visit via Video Note:  Dylan [name]  initiated request for Telemedicine visit with [facility] team. I connected with Dylan [name]  on 03/13/2019 at 11:56 AM  for a synchronized telemedicine visit using a video enabled HIPPA compliant telemedicine application. I verified that I am speaking with Dylan [name]  using two identifiers. Dylan July, NP  was physically located in a [facility] Urgent care site and Dylan [name] was located at a different location.   The limitations of evaluation and management by telemedicine as well as the availability of in-person appointments were discussed. Patient was informed that he  may incur a bill ( including co-pay) for this virtual visit encounter. Dylan [name]  expressed understanding and gave verbal consent to proceed with virtual visit.     History of Present Illness:Dylan [name]  is a 54 y.o. male presents with stomach pain, nausea, vomiting and diarrhea. This started at 4 am. Feels like its improving some. Having some stomach cramping. Drank some OJ and held this down.  No hx of stomach issues in the past. Denies fever. He made tacos at home with avocados. Believes that it may be the avocados. He has not had these before.    Past Medical History:  Diagnosis Date  . Anxiety   . Arthritis   . Bipolar 1 disorder ([place])   . Colon polyps   . Depression   . GERD (gastroesophageal reflux disease)   . Hepatitis B   . Human immunodeficiency virus (HIV) ([place])   . Hyperlipidemia   . Hypertension   . Schizophrenia ([place])     Allergies  Allergen Reactions  . Penicillins Other (See Comments)    Childhood allergy  . Latex Rash  . Tape Rash        Observations/Objective: VITALS: Per patient if applicable, see vitals. GENERAL: Alert, appears well and in no acute distress. HEENT: Atraumatic, conjunctiva clear, no obvious abnormalities on inspection of external nose and ears. NECK: Normal movements of the head and  neck. CARDIOPULMONARY: No increased WOB. Speaking in clear sentences. I:E ratio WNL.  MS: Moves all visible extremities without noticeable abnormality. PSYCH: Pleasant and cooperative, well-groomed. Speech normal rate and rhythm. Affect is appropriate. Insight and judgement are appropriate. Attention is focused, linear, and appropriate.  NEURO: CN grossly intact. Oriented as arrived to appointment on time with no prompting. Moves both UE equally.  SKIN: No obvious lesions, wounds, erythema, or cyanosis noted on face or hands.     Assessment and Plan: Most likely gastroenteritis Will treat his symptoms with Zofran for nausea, vomiting and Bentyl for stomach cramping.   Follow Up Instructions: Recommend follow-up in person for any continued or worsening problems   I discussed the assessment and treatment plan with the patient. The patient was provided an opportunity to ask questions and all were answered. The patient agreed with the plan and demonstrated an understanding of the instructions.   The patient was advised to call back or seek an in-person evaluation if the symptoms worsen or if the condition fails to improve as anticipated.    Dylan July, NP  03/13/2019 11:56 AM         Dylan July, NP 03/13/19 1156

## 2019-03-13 NOTE — Discharge Instructions (Addendum)
Take the medication as prescribed Sip fluids to stay hydrated Follow up as needed for continued or worsening symptoms

## 2019-03-24 ENCOUNTER — Other Ambulatory Visit: Payer: Self-pay

## 2019-03-24 ENCOUNTER — Other Ambulatory Visit: Payer: Medicaid Other

## 2019-03-24 DIAGNOSIS — B20 Human immunodeficiency virus [HIV] disease: Secondary | ICD-10-CM

## 2019-03-25 LAB — T-HELPER CELL (CD4) - (RCID CLINIC ONLY)
CD4 % Helper T Cell: 34 % (ref 33–65)
CD4 T Cell Abs: 1176 /uL (ref 400–1790)

## 2019-03-26 LAB — HIV-1 RNA QUANT-NO REFLEX-BLD
HIV 1 RNA Quant: 38 copies/mL — ABNORMAL HIGH
HIV-1 RNA Quant, Log: 1.58 Log copies/mL — ABNORMAL HIGH

## 2019-04-06 ENCOUNTER — Encounter: Payer: Self-pay | Admitting: Family Medicine

## 2019-04-06 DIAGNOSIS — M79671 Pain in right foot: Secondary | ICD-10-CM

## 2019-04-08 ENCOUNTER — Encounter: Payer: Medicaid Other | Admitting: Internal Medicine

## 2019-04-15 ENCOUNTER — Ambulatory Visit (INDEPENDENT_AMBULATORY_CARE_PROVIDER_SITE_OTHER): Payer: Medicaid Other | Admitting: Internal Medicine

## 2019-04-15 ENCOUNTER — Other Ambulatory Visit: Payer: Self-pay | Admitting: Family Medicine

## 2019-04-15 ENCOUNTER — Other Ambulatory Visit: Payer: Self-pay

## 2019-04-15 ENCOUNTER — Encounter: Payer: Self-pay | Admitting: Internal Medicine

## 2019-04-15 VITALS — BP 138/83 | HR 90 | Temp 98.0°F | Wt 238.0 lb

## 2019-04-15 DIAGNOSIS — B20 Human immunodeficiency virus [HIV] disease: Secondary | ICD-10-CM | POA: Diagnosis not present

## 2019-04-15 DIAGNOSIS — F251 Schizoaffective disorder, depressive type: Secondary | ICD-10-CM

## 2019-04-15 DIAGNOSIS — M79671 Pain in right foot: Secondary | ICD-10-CM

## 2019-04-15 DIAGNOSIS — N182 Chronic kidney disease, stage 2 (mild): Secondary | ICD-10-CM | POA: Diagnosis not present

## 2019-04-15 NOTE — Progress Notes (Signed)
RFV: follow up for hiv disease  Patient ID: Dylan [name], male   DOB: Sep 17, 1965, 54 y.o.   MRN: [id number]  HPI Dylan [name] is a 54yo M with well controlled hiv disease, currently on Biktarvy (missed 2 dose since he was out of town visiting his brother. Has 2nd vaccine tomorrow. Doing well with exception to finding out that his girlfriend who is recently diagnosed with colon cancer  Outpatient Encounter Medications as of 04/15/2019  Medication Sig  . BIKTARVY 50-200-25 MG TABS tablet TAKE 1 TABLET BY MOUTH DAILY  . gabapentin (NEURONTIN) 300 MG capsule Take 1 capsule (300 mg total) by mouth 2 (two) times daily.  . hydrOXYzine (ATARAX/VISTARIL) 25 MG tablet Take 25 mg by mouth 3 (three) times daily as needed.  . sildenafil (VIAGRA) 50 MG tablet Take 1 tablet (50 mg total) by mouth daily as needed for erectile dysfunction. At least 24 hours between doses  . traZODone (DESYREL) 100 MG tablet Take 2 tablets (200 mg total) by mouth at bedtime. For insomnia.  Marland Kitchen diclofenac sodium (VOLTAREN) 1 % GEL Apply 4 g topically 4 (four) times daily. (Patient not taking: Reported on 04/15/2019)  . dicyclomine (BENTYL) 20 MG tablet Take 1 tablet (20 mg total) by mouth 2 (two) times daily. (Patient not taking: Reported on 04/15/2019)  . ondansetron (ZOFRAN ODT) 4 MG disintegrating tablet Take 1 tablet (4 mg total) by mouth every 8 (eight) hours as needed for nausea or vomiting. (Patient not taking: Reported on 04/15/2019)  . QUEtiapine (SEROQUEL) 200 MG tablet Take 200 mg by mouth at bedtime.  . rosuvastatin (CRESTOR) 20 MG tablet Take 1 tablet (20 mg total) by mouth daily. (Patient not taking: Reported on 04/15/2019)  . thiamine 50 MG tablet Take 1 tablet (50 mg total) by mouth daily. (Patient not taking: Reported on 12/09/2018)   Facility-Administered Encounter Medications as of 04/15/2019  Medication  . 0.9 %  sodium chloride infusion     Patient Active Problem List   Diagnosis Date Noted  . Tobacco abuse  07/10/2018  . ESRD needing dialysis ([place]) 03/03/2018  . Cocaine abuse with cocaine-induced mood disorder ([place]) 05/18/2017  . Schizoaffective disorder, bipolar type ([place])   . Acute renal failure (ARF) ([place])   . Suicidal ideations   . Chronic 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 coma ([place])   . AKI (acute kidney injury) ([place]) 02/19/2017  . Rhabdomyolysis 02/19/2017  . Suicidal overdose ([place]) 02/19/2017  . Acute hepatitis 02/19/2017  . Elevated LFTs   . Homicidal ideations   . Schizoaffective disorder, depressive type ([place]) 09/05/2011  . 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 Degenerative disc disease 08/21/2011  . HIV disease ([place]) 08/17/2011   Social History   Tobacco Use  . Smoking status: Current Every Day Smoker    Packs/day: 0.50    Years: 15.00    Pack years: 7.50    Types: Cigarettes  . Smokeless tobacco: Never Used  . Tobacco comment: has patches when he is ready to quit  Substance Use Topics  . Alcohol use: Yes    Comment: occassional use on weekends  . Drug use: Not Currently    Types: Cocaine    Comment: past    There are no preventive care reminders to display for this patient.   Review of Systems   Constitutional: Negative for fever, chills, diaphoresis, activity change, appetite change, fatigue and unexpected weight change.  HENT: Negative for congestion, sore throat, rhinorrhea, sneezing, trouble  swallowing and sinus pressure.  Eyes: Negative for photophobia and visual disturbance.  Respiratory: Negative for cough, chest tightness, shortness of breath, wheezing and stridor.  Cardiovascular: Negative for chest pain, palpitations and leg swelling.  Gastrointestinal: Negative for nausea, vomiting, abdominal pain, diarrhea, constipation, blood in stool, abdominal distention and anal bleeding.  Genitourinary: Negative for dysuria, hematuria, flank pain and difficulty urinating.  Musculoskeletal: Negative for  myalgias, back pain, joint swelling, arthralgias and gait problem.  Skin: Negative for color change, pallor, rash and wound.  Neurological: Negative for dizziness, tremors, weakness and light-headedness.  Hematological: Negative for adenopathy. Does not bruise/bleed easily.  Psychiatric/Behavioral: Negative for behavioral problems, confusion, sleep disturbance, dysphoric mood, decreased concentration and agitation.    Physical Exam   BP 138/83   Pulse 90   Temp 98 F (36.7 C) (Oral)   Wt 238 lb (108 kg)   BMI 30.56 kg/m   Physical Exam  Constitutional: He is oriented to person, place, and time. He appears well-developed and well-nourished. No distress.  HENT:  Mouth/Throat: Oropharynx is clear and moist. No oropharyngeal exudate.  Cardiovascular: Normal rate, regular rhythm and normal heart sounds. Exam reveals no gallop and no friction rub.  No murmur heard.  Pulmonary/Chest: Effort normal and breath sounds normal. No respiratory distress. He has no wheezes. .  Lymphadenopathy:  He has no cervical adenopathy.  Neurological: He is alert and oriented to person, place, and time.  Skin: Skin is warm and dry. No rash noted. No erythema.  Psychiatric: He has a normal mood and affect. His behavior is normal.    Lab Results  Component Value Date   CD4TCELL 34 03/24/2019   Lab Results  Component Value Date   CD4TABS 1,176 03/24/2019   CD4TABS 1,136 09/11/2018   CD4TABS 942 06/06/2018   Lab Results  Component Value Date   HIV1RNAQUANT 38 (H) 03/24/2019   Lab Results  Component Value Date   HEPBSAB NON-REACTIVE 07/24/2017   Lab Results  Component Value Date   LABRPR NON-REACTIVE 06/06/2018    CBC Lab Results  Component Value Date   WBC 8.3 09/11/2018   RBC 4.75 09/11/2018   HGB 15.3 09/11/2018   HCT 44.8 09/11/2018   PLT 184 09/11/2018   MCV 94.3 09/11/2018   MCH 32.2 09/11/2018   MCHC 34.2 09/11/2018   RDW 13.3 09/11/2018   LYMPHSABS 3,453 09/11/2018   MONOABS  0.7 03/24/2017   EOSABS 349 09/11/2018    BMET Lab Results  Component Value Date   NA 137 02/13/2019   K 4.5 02/13/2019   CL 105 02/13/2019   CO2 18 (L) 02/13/2019   GLUCOSE 93 02/13/2019   BUN 18 02/13/2019   CREATININE 1.25 02/13/2019   CALCIUM 9.8 02/13/2019   GFRNONAA 65 02/13/2019   GFRAA 76 02/13/2019      Assessment and Plan  HIV disease = well controlled. Continue on biktarvy  Long term medication management/CKD 2 = cr is stable.  Schizoaffective disorder = stable  Health maintenance = finish out covid vaccine series

## 2019-04-17 ENCOUNTER — Other Ambulatory Visit: Payer: Medicaid Other

## 2019-04-17 ENCOUNTER — Other Ambulatory Visit: Payer: Self-pay

## 2019-04-17 DIAGNOSIS — B20 Human immunodeficiency virus [HIV] disease: Secondary | ICD-10-CM

## 2019-04-17 MED ORDER — BIKTARVY 50-200-25 MG PO TABS: 1.0000 | ORAL_TABLET | Freq: Every day | ORAL | 5 refills | Status: DC

## 2019-04-30 ENCOUNTER — Telehealth: Payer: Self-pay | Admitting: Family Medicine

## 2019-04-30 ENCOUNTER — Other Ambulatory Visit: Payer: Self-pay

## 2019-04-30 ENCOUNTER — Ambulatory Visit: Payer: Medicaid Other

## 2019-04-30 DIAGNOSIS — M79671 Pain in right foot: Secondary | ICD-10-CM

## 2019-04-30 NOTE — Telephone Encounter (Signed)
Three-view x-rays right foot are notable for degenerative change at the second and third TMT joints.  There is joint space narrowing and periarticular spurring.  No definite stress fracture seen.

## 2019-04-30 NOTE — Progress Notes (Signed)
Right foot xray taken today per the patient's insurance. Dr. Junius Roads has reviewed it and made a notation in the chart.

## 2019-05-16 ENCOUNTER — Encounter: Payer: Self-pay | Admitting: Family Medicine

## 2019-05-16 ENCOUNTER — Telehealth: Payer: Self-pay | Admitting: Orthopedic Surgery

## 2019-05-16 NOTE — Telephone Encounter (Signed)
Called patient left message on voicemail to return call to schedule an appointment with Dr. Sharol Given for chronic foot pain

## 2019-05-22 ENCOUNTER — Encounter: Payer: Self-pay | Admitting: Orthopedic Surgery

## 2019-05-22 ENCOUNTER — Other Ambulatory Visit: Payer: Self-pay

## 2019-05-22 ENCOUNTER — Ambulatory Visit: Payer: Medicaid Other | Admitting: Orthopedic Surgery

## 2019-05-22 DIAGNOSIS — S93324A Dislocation of tarsometatarsal joint of right foot, initial encounter: Secondary | ICD-10-CM

## 2019-05-26 ENCOUNTER — Encounter: Payer: Self-pay | Admitting: Orthopedic Surgery

## 2019-05-26 NOTE — Progress Notes (Signed)
Office Visit Note   Patient: Dylan [name]           Date of Birth: Jul 11, 1965           MRN: [id number] Visit Date: 05/22/2019              Requested by: Charlott Rakes, MD [place],  [place] [address] PCP: Charlott Rakes, MD  Chief Complaint  Patient presents with  . Right Foot - Pain      HPI: Patient is a 54 year old gentleman who sustained a Lisfranc injury from a fall from a ladder last year.  Patient states that anti-inflammatories have not helped he states his pain is a 10 out of 10 he has undergone good conservative therapy with immobilization without resolution of his symptoms.  Patient does smoke daily.  Assessment & Plan: Visit Diagnoses:  1. Lisfranc dislocation, right, initial encounter     Plan: Discussed the importance of smoking cessation.  Discussed that with the persistent pain and dislocation through the Lisfranc complex his best option is to proceed with open reduction internal fixation and fusion across the Lisfranc complex.  Risks and benefits of surgery were discussed including risk of the wound not healing need for additional surgery.  Patient states he understands wished to proceed at this time.  Follow-Up Instructions: Return in about 2 weeks (around 06/05/2019).   Ortho Exam  Patient is alert, oriented, no adenopathy, well-dressed, normal affect, normal respiratory effort. Examination patient has a strong dorsalis pedis pulse.  He has pain with distraction across the Lisfranc complex.  Review of the radiographs shows dislocation across the Lisfranc ligament he is point tender to palpation at this location.  Imaging: No results found. No images are attached to the encounter.  Labs: Lab Results  Component Value Date   HGBA1C 6.1 (H) 02/13/2019   HGBA1C 5.7 (H) 06/20/2018   REPTSTATUS 03/03/2017 FINAL 02/26/2017   CULT  02/26/2017    NO GROWTH 5 DAYS Performed at [facility], [place] [address], [place], [place]  [address]      Lab Results  Component Value Date   ALBUMIN 4.6 02/13/2019   ALBUMIN 3.9 05/17/2017   ALBUMIN 3.5 03/24/2017    Lab Results  Component Value Date   MG 2.8 (H) 02/20/2017   No results found for: VD25OH  No results found for: PREALBUMIN CBC EXTENDED Latest Ref Rng & Units 09/11/2018 06/06/2018 10/23/2017  WBC 3.8 - 10.8 Thousand/uL 8.3 6.4 7.7  RBC 4.20 - 5.80 Million/uL 4.75 4.69 4.53  HGB 13.2 - 17.1 g/dL 15.3 15.0 14.2  HCT 38.5 - 50.0 % 44.8 42.6 40.9  PLT 140 - 400 Thousand/uL 184 176 207  NEUTROABS 1,500 - 7,800 cells/uL 3,868 2,899 3,519  LYMPHSABS 850 - 3,900 cells/uL 3,453 2,803 3,072     There is no height or weight on file to calculate BMI.  Orders:  No orders of the defined types were placed in this encounter.  No orders of the defined types were placed in this encounter.    Procedures: No procedures performed  Clinical Data: No additional findings.  ROS:  All other systems negative, except as noted in the HPI. Review of Systems  Objective: Vital Signs: There were no vitals taken for this visit.  Specialty Comments:  No specialty comments available.  PMFS History: Patient Active Problem List   Diagnosis Date Noted  . Tobacco abuse 07/10/2018  . ESRD needing dialysis ([place]) 03/03/2018  . Cocaine abuse  with cocaine-induced mood disorder ([place]) 05/18/2017  . Schizoaffective disorder, bipolar type ([place])   . Acute renal failure (ARF) ([place])   . Suicidal ideations   . Chronic 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 coma ([place])   . AKI (acute kidney injury) ([place]) 02/19/2017  . Rhabdomyolysis 02/19/2017  . Suicidal overdose ([place]) 02/19/2017  . Acute hepatitis 02/19/2017  . Elevated LFTs   . Homicidal ideations   . Schizoaffective disorder, depressive type ([place]) 09/05/2011  . 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 Degenerative disc disease 08/21/2011  . HIV disease ([place]) 08/17/2011   Past  Medical History:  Diagnosis Date  . Anxiety   . Arthritis   . Bipolar 1 disorder ([place])   . Colon polyps   . Depression   . GERD (gastroesophageal reflux disease)   . Hepatitis B   . Human immunodeficiency virus (HIV) ([place])   . Hyperlipidemia   . Hypertension   . Schizophrenia ([place])     Family History  Problem Relation Age of Onset  . CAD Mother   . Diabetes Father   . Colon cancer Maternal Aunt 31  . Diabetes Maternal Aunt   . Heart disease Maternal Uncle   . Esophageal cancer Neg Hx   . Rectal cancer Neg Hx   . Stomach cancer Neg Hx     Past Surgical History:  Procedure Laterality Date  . IR FLUORO GUIDE CV LINE RIGHT  02/22/2017  . IR US GUIDE VASC ACCESS RIGHT  02/22/2017  . TUMOR REMOVAL     From Chest   Social History   Occupational History  . Not on file  Tobacco Use  . Smoking status: Current Every Day Smoker    Packs/day: 0.50    Years: 15.00    Pack years: 7.50    Types: Cigarettes  . Smokeless tobacco: Never Used  . Tobacco comment: has patches when he is ready to quit  Substance and Sexual Activity  . Alcohol use: Yes    Comment: occassional use on weekends  . Drug use: Not Currently    Types: Cocaine    Comment: past  . Sexual activity: Yes    Partners: Female    Comment: condoms given

## 2019-05-27 ENCOUNTER — Encounter: Payer: Self-pay | Admitting: Orthopedic Surgery

## 2019-06-08 ENCOUNTER — Encounter: Payer: Self-pay | Admitting: Family Medicine

## 2019-06-19 ENCOUNTER — Other Ambulatory Visit: Payer: Self-pay | Admitting: Family Medicine

## 2019-06-19 DIAGNOSIS — G629 Polyneuropathy, unspecified: Secondary | ICD-10-CM

## 2019-06-25 ENCOUNTER — Other Ambulatory Visit: Payer: Self-pay

## 2019-06-25 MED ORDER — ROSUVASTATIN CALCIUM 20 MG PO TABS: 20.0000 mg | ORAL_TABLET | Freq: Every day | ORAL | 4 refills | Status: DC

## 2019-07-02 ENCOUNTER — Ambulatory Visit: Payer: Medicaid Other | Admitting: Family Medicine

## 2019-07-11 ENCOUNTER — Ambulatory Visit (HOSPITAL_COMMUNITY)
Admission: EM | Admit: 2019-07-11 | Discharge: 2019-07-12 | Disposition: A | Discharge status: Home / Self Care | Payer: Medicaid Other | Attending: Psychiatry | Admitting: Psychiatry

## 2019-07-11 ENCOUNTER — Other Ambulatory Visit: Payer: Self-pay

## 2019-07-11 DIAGNOSIS — Z21 Asymptomatic human immunodeficiency virus [HIV] infection status: Secondary | ICD-10-CM | POA: Insufficient documentation

## 2019-07-11 DIAGNOSIS — R45851 Suicidal ideations: Secondary | ICD-10-CM | POA: Insufficient documentation

## 2019-07-11 DIAGNOSIS — Z79899 Other long term (current) drug therapy: Secondary | ICD-10-CM | POA: Insufficient documentation

## 2019-07-11 DIAGNOSIS — Z20822 Contact with and (suspected) exposure to covid-19: Secondary | ICD-10-CM | POA: Insufficient documentation

## 2019-07-11 DIAGNOSIS — F250 Schizoaffective disorder, bipolar type: Secondary | ICD-10-CM | POA: Insufficient documentation

## 2019-07-11 DIAGNOSIS — F17210 Nicotine dependence, cigarettes, uncomplicated: Secondary | ICD-10-CM | POA: Insufficient documentation

## 2019-07-11 DIAGNOSIS — F419 Anxiety disorder, unspecified: Secondary | ICD-10-CM | POA: Insufficient documentation

## 2019-07-11 LAB — POC SARS CORONAVIRUS 2 AG: SARS Coronavirus 2 Ag: NEGATIVE

## 2019-07-11 LAB — LIPID PANEL
Cholesterol: 198 mg/dL (ref 0–200)
HDL: 52 mg/dL (ref >40)
LDL Cholesterol: 136 mg/dL — ABNORMAL HIGH (ref 0–99)
Total CHOL/HDL Ratio: 3.8 RATIO
Triglycerides: 52 mg/dL (ref <150)
VLDL: 10 mg/dL (ref 0–40)

## 2019-07-11 LAB — COMPREHENSIVE METABOLIC PANEL
ALT: 54 U/L — ABNORMAL HIGH (ref 0–44)
AST: 38 U/L (ref 15–41)
Albumin: 4.5 g/dL (ref 3.5–5.0)
Alkaline Phosphatase: 86 U/L (ref 38–126)
Anion gap: 9 (ref 5–15)
BUN: 15 mg/dL (ref 6–20)
CO2: 24 mmol/L (ref 22–32)
Calcium: 9.7 mg/dL (ref 8.9–10.3)
Chloride: 103 mmol/L (ref 98–111)
Creatinine, Ser: 0.88 mg/dL (ref 0.61–1.24)
GFR calc Af Amer: >60 mL/min (ref >60)
GFR calc non Af Amer: >60 mL/min (ref >60)
Glucose, Bld: 93 mg/dL (ref 70–99)
Potassium: 3.7 mmol/L (ref 3.5–5.1)
Sodium: 136 mmol/L (ref 135–145)
Total Bilirubin: 0.8 mg/dL (ref 0.3–1.2)
Total Protein: 7.9 g/dL (ref 6.5–8.1)

## 2019-07-11 LAB — CBC WITH DIFFERENTIAL/PLATELET
Abs Immature Granulocytes: 0.05 10*3/uL (ref 0.00–0.07)
Basophils Absolute: 0 10*3/uL (ref 0.0–0.1)
Basophils Relative: 0 %
Eosinophils Absolute: 0 10*3/uL (ref 0.0–0.5)
Eosinophils Relative: 0 %
HCT: 44.7 % (ref 39.0–52.0)
Hemoglobin: 14.9 g/dL (ref 13.0–17.0)
Immature Granulocytes: 1 %
Lymphocytes Relative: 17 %
Lymphs Abs: 1.8 10*3/uL (ref 0.7–4.0)
MCH: 30.8 pg (ref 26.0–34.0)
MCHC: 33.3 g/dL (ref 30.0–36.0)
MCV: 92.5 fL (ref 80.0–100.0)
Monocytes Absolute: 0.6 10*3/uL (ref 0.1–1.0)
Monocytes Relative: 6 %
Neutro Abs: 8.2 10*3/uL — ABNORMAL HIGH (ref 1.7–7.7)
Neutrophils Relative %: 76 %
Platelets: 180 10*3/uL (ref 150–400)
RBC: 4.83 MIL/uL (ref 4.22–5.81)
RDW: 13 % (ref 11.5–15.5)
WBC: 10.6 10*3/uL — ABNORMAL HIGH (ref 4.0–10.5)
nRBC: 0 % (ref 0.0–0.2)

## 2019-07-11 LAB — POCT URINE DRUG SCREEN - MANUAL ENTRY (I-SCREEN)
POC Amphetamine UR: NOT DETECTED
POC Buprenorphine (BUP): NOT DETECTED
POC Cocaine UR: POSITIVE — AB
POC Marijuana UR: NOT DETECTED
POC Methadone UR: NOT DETECTED
POC Methamphetamine UR: NOT DETECTED
POC Morphine: NOT DETECTED
POC Oxazepam (BZO): NOT DETECTED
POC Oxycodone UR: NOT DETECTED
POC Secobarbital (BAR): NOT DETECTED

## 2019-07-11 LAB — HEMOGLOBIN A1C
Hgb A1c MFr Bld: 6 % — ABNORMAL HIGH (ref 4.8–5.6)
Mean Plasma Glucose: 125.5 mg/dL

## 2019-07-11 LAB — ETHANOL: Alcohol, Ethyl (B): <10 mg/dL (ref <10)

## 2019-07-11 LAB — TSH: TSH: 0.91 u[IU]/mL (ref 0.350–4.500)

## 2019-07-11 MED ORDER — ACETAMINOPHEN 325 MG PO TABS: 650.0000 mg | ORAL_TABLET | Freq: Four times a day (QID) | ORAL | Status: DC | PRN

## 2019-07-11 MED ORDER — QUETIAPINE FUMARATE 50 MG PO TABS
50.0000 mg | ORAL_TABLET | Freq: Two times a day (BID) | ORAL | Status: DC
  Administered 2019-07-11 (×2): 50 mg via ORAL
  Filled 2019-07-11 (×3): qty 1

## 2019-07-11 MED ORDER — QUETIAPINE FUMARATE 200 MG PO TABS
200.0000 mg | ORAL_TABLET | Freq: Every day | ORAL | Status: DC
  Administered 2019-07-11: 200 mg via ORAL
  Filled 2019-07-11: qty 1

## 2019-07-11 MED ORDER — ALUM & MAG HYDROXIDE-SIMETH 200-200-20 MG/5ML PO SUSP: 30.0000 mL | ORAL | Status: DC | PRN

## 2019-07-11 MED ORDER — MAGNESIUM HYDROXIDE 400 MG/5ML PO SUSP: 30.0000 mL | Freq: Every day | ORAL | Status: DC | PRN

## 2019-07-11 MED ORDER — ROSUVASTATIN CALCIUM 20 MG PO TABS
20.0000 mg | ORAL_TABLET | Freq: Every day | ORAL | Status: DC
  Administered 2019-07-11 – 2019-07-12 (×2): 20 mg via ORAL
  Filled 2019-07-11 (×3): qty 1

## 2019-07-11 MED ORDER — GABAPENTIN 300 MG PO CAPS
300.0000 mg | ORAL_CAPSULE | Freq: Two times a day (BID) | ORAL | Status: DC
  Administered 2019-07-11 – 2019-07-12 (×3): 300 mg via ORAL
  Filled 2019-07-11 (×3): qty 1

## 2019-07-11 MED ORDER — HYDROXYZINE HCL 25 MG PO TABS: 25.0000 mg | ORAL_TABLET | Freq: Three times a day (TID) | ORAL | Status: DC | PRN

## 2019-07-11 MED ORDER — BICTEGRAVIR-EMTRICITAB-TENOFOV 50-200-25 MG PO TABS
1.0000 | ORAL_TABLET | Freq: Every day | ORAL | Status: DC
  Administered 2019-07-11 – 2019-07-12 (×2): 1 via ORAL
  Filled 2019-07-11 (×2): qty 1

## 2019-07-11 NOTE — ED Notes (Signed)
Belongings in locker 26

## 2019-07-11 NOTE — ED Provider Notes (Signed)
Behavioral Health Admission H&P [facility] & OBS)  Date: 07/11/19 Patient Name: Dylan [name] MRN: [id number] Chief Complaint: No chief complaint on file.     Diagnoses:  Final diagnoses:  None    HPI: Patient is Dylan [name] 54 year old male that presented as a walk-in to the [facility] reporting suicidal ideations.  Patient continues to report that he is having hallucinations and states that he feels that someone is trying to rape him.  Patient also reports that he feels like there is something pulling at him and he feels like the floor is going out from underneath him.  Patient reports that he is on Seroquel that used to be prescribed to him through [place] but he has not been on it for about a week.  He reports taking 200 mg at bedtime. Patient presents talking loudly and reporting various hallucinations as well as suicidal ideations.  However patient does not appear to be actively psychotic.  Patient when being asked direct questions is able to redirect and answer questions appropriately.  Patient seems to be self exacerbating his symptoms for an admission.  Based on the patient's reports of not taking his medication for approximately a week we will restart his Seroquel 200 mg p.o. nightly and will also provide him with 50 mg p.o. twice daily.  Patient will be admitted to the continuous observation unit at the [facility].  [place] 2-9:    Office Visit from 09/03/2018 in [facility] Office Visit from 02/21/2018 in [facility] for Infectious Disease Office Visit from 03/26/2012 in [facility] for Infectious Disease  Thoughts that you would be better off dead, or of hurting yourself in some way Not at all Several days Not at all  PHQ-9 Total Score 15 9 7         Total Time spent with patient: 30 minutes  Musculoskeletal  Strength & Muscle Tone: within normal limits Gait & Station: normal Patient leans: N/A  Psychiatric Specialty Exam   Presentation General Appearance: Disheveled  Eye Contact:No data recorded Speech:No data recorded Speech Volume:No data recorded Handedness:No data recorded  Mood and Affect  Mood:No data recorded Affect:No data recorded  Thought Process  Thought Processes:No data recorded Descriptions of Associations:No data recorded Orientation:No data recorded Thought Content:No data recorded Hallucinations:No data recorded Ideas of Reference:No data recorded Suicidal Thoughts:No data recorded Homicidal Thoughts:No data recorded  Sensorium  Memory:No data recorded Judgment:No data recorded Insight:No data recorded  Executive Functions  Concentration:No data recorded Attention Span:No data recorded Recall:No data recorded Fund of Knowledge:No data recorded Language:No data recorded  Psychomotor Activity  Psychomotor Activity:No data recorded  Assets  Assets:No data recorded  Sleep  Sleep:No data recorded  Physical Exam Vitals and nursing note reviewed.  Constitutional:      Appearance: He is well-developed.  Cardiovascular:     Rate and Rhythm: Normal rate.  Pulmonary:     Effort: Pulmonary effort is normal.  Musculoskeletal:        General: Normal range of motion.  Skin:    General: Skin is warm.  Neurological:     Mental Status: He is alert and oriented to person, place, and time.    Review of Systems  Constitutional: Negative.   HENT: Negative.   Eyes: Negative.   Respiratory: Negative.   Cardiovascular: Negative.   Gastrointestinal: Negative.   Genitourinary: Negative.   Musculoskeletal: Negative.   Skin: Negative.   Neurological: Negative.   Endo/Heme/Allergies: Negative.  Psychiatric/Behavioral: Positive for depression, hallucinations and suicidal ideas.    Blood pressure 130/83, pulse 89, temperature 97.8 F (36.6 C), temperature source Skin, resp. rate 16, height 6\' 3"  (1.905 m), weight 230 lb (104.3 kg), SpO2 98 %. Body mass index is 28.75  kg/m.  Past Psychiatric History: Schizoaffective, cocaine abuse, suicidal and homicidal ideations   Is the patient at risk to self? Yes  Has the patient been a risk to self in the past 6 months? No .    Has the patient been a risk to self within the distant past? No   Is the patient a risk to others? No   Has the patient been a risk to others in the past 6 months? No   Has the patient been a risk to others within the distant past? Yes   Past Medical History:  Past Medical History:  Diagnosis Date  . Anxiety   . Arthritis   . Bipolar 1 disorder ([place])   . Colon polyps   . Depression   . GERD (gastroesophageal reflux disease)   . Hepatitis B   . Human immunodeficiency virus (HIV) ([place])   . Hyperlipidemia   . Hypertension   . Schizophrenia [facility])     Past Surgical History:  Procedure Laterality Date  . IR FLUORO GUIDE CV LINE RIGHT  02/22/2017  . IR US GUIDE VASC ACCESS RIGHT  02/22/2017  . TUMOR REMOVAL     From Chest    Family History:  Family History  Problem Relation Age of Onset  . CAD Mother   . Diabetes Father   . Colon cancer Maternal Aunt 48  . Diabetes Maternal Aunt   . Heart disease Maternal Uncle   . Esophageal cancer Neg Hx   . Rectal cancer Neg Hx   . Stomach cancer Neg Hx     Social History:  Social History   Socioeconomic History  . Marital status: Married    Spouse name: Not on file  . Number of children: 1  . Years of education: Not on file  . Highest education level: Not on file  Occupational History  . Not on file  Tobacco Use  . Smoking status: Current Every Day Smoker    Packs/day: 0.50    Years: 15.00    Pack years: 7.50    Types: Cigarettes  . Smokeless tobacco: Never Used  . Tobacco comment: has patches when he is ready to quit  Vaping Use  . Vaping Use: Never used  Substance and Sexual Activity  . Alcohol use: Yes    Comment: occassional use on weekends  . Drug use: Not Currently    Types: Cocaine    Comment: past  . Sexual  activity: Yes    Partners: Female    Comment: condoms given  Other Topics Concern  . Not on file  Social History Narrative  . Not on file   Social Determinants of Health   Financial Resource Strain:   . Difficulty of Paying Living Expenses:   Food Insecurity:   . Worried About Charity fundraiser in the Last Year:   . Arboriculturist in the Last Year:   Transportation Needs:   . Film/video editor (Medical):   Marland Kitchen Lack of Transportation (Non-Medical):   Physical Activity:   . Days of Exercise per Week:   . Minutes of Exercise per Session:   Stress:   . Feeling of Stress :   Social Connections:   .  Frequency of Communication with Friends and Family:   . Frequency of Social Gatherings with Friends and Family:   . Attends Religious Services:   . Active Member of Clubs or Organizations:   . Attends Archivist Meetings:   Marland Kitchen Marital Status:   Intimate Partner Violence:   . Fear of Current or Ex-Partner:   . Emotionally Abused:   Marland Kitchen Physically Abused:   . Sexually Abused:     SDOH:  SDOH Screenings   Alcohol Screen:   . Last Alcohol Screening Score (AUDIT):   Depression (PHQ2-9): Low Risk   . PHQ-2 Score: 1  Financial Resource Strain:   . Difficulty of Paying Living Expenses:   Food Insecurity:   . Worried About Charity fundraiser in the Last Year:   . [place] in the Last Year:   Housing:   . Last Housing Risk Score:   Physical Activity:   . Days of Exercise per Week:   . Minutes of Exercise per Session:   Social Connections:   . Frequency of Communication with Friends and Family:   . Frequency of Social Gatherings with Friends and Family:   . Attends Religious Services:   . Active Member of Clubs or Organizations:   . Attends Archivist Meetings:   Marland Kitchen Marital Status:   Stress:   . Feeling of Stress :   Tobacco Use: High Risk  . Smoking Tobacco Use: Current Every Day Smoker  . Smokeless Tobacco Use: Never Used  Transportation  Needs:   . Film/video editor (Medical):   Marland Kitchen Lack of Transportation (Non-Medical):     Last Labs:  Lab on 03/24/2019  Component Date Value Ref Range Status  . HIV 1 RNA Quant 03/24/2019 38* NOT DETECT copies/mL Final  . HIV-1 RNA Quant, Log 03/24/2019 1.58* NOT DETECT Log copies/mL Final   Comment: . This test was performed using Real-Time Polymerase Chain Reaction. . Reportable Range: 20 copies/mL to 10,000,000 copies/mL (1.30 log copies/mL to 7.00 log copies/mL).   . CD4 T Cell Abs 03/24/2019 1,176  400 - 1,790 /uL Final  . CD4 % Helper T Cell 03/24/2019 34  33 - 65 % Final   Performed at [facility], [place] [address], [place], [place] [address]  Office Visit on 02/13/2019  Component Date Value Ref Range Status  . Vitamin B-12 02/13/2019 464  232 - 1,245 pg/mL Final  . Hgb A1c MFr Bld 02/13/2019 6.1* 4.8 - 5.6 % Final   Comment:          Prediabetes: 5.7 - 6.4          Diabetes: >6.4          Glycemic control for adults with diabetes: <7.0   . Est. average glucose Bld gHb Est-m* 02/13/2019 128  mg/dL Final  . Glucose 02/13/2019 93  65 - 99 mg/dL Final  . BUN 02/13/2019 18  6 - 24 mg/dL Final  . Creatinine, Ser 02/13/2019 1.25  0.76 - 1.27 mg/dL Final  . GFR calc non Af Amer 02/13/2019 65  >59 mL/min/1.73 Final  . GFR calc Af Amer 02/13/2019 76  >59 mL/min/1.73 Final  . BUN/Creatinine Ratio 02/13/2019 14  9 - 20 Final  . Sodium 02/13/2019 137  134 - 144 mmol/L Final  . Potassium 02/13/2019 4.5  3.5 - 5.2 mmol/L Final  . Chloride 02/13/2019 105  96 - 106 mmol/L Final  . CO2 02/13/2019 18* 20 - 29 mmol/L  Final  . Calcium 02/13/2019 9.8  8.7 - 10.2 mg/dL Final  . Total Protein 02/13/2019 7.4  6.0 - 8.5 g/dL Final  . Albumin 02/13/2019 4.6  3.8 - 4.9 g/dL Final  . Globulin, Total 02/13/2019 2.8  1.5 - 4.5 g/dL Final  . Albumin/Globulin Ratio 02/13/2019 1.6  1.2 - 2.2 Final  . Bilirubin Total 02/13/2019 0.3  0.0 - 1.2 mg/dL Final  . Alkaline Phosphatase  02/13/2019 85  39 - 117 IU/L Final  . AST 02/13/2019 46* 0 - 40 IU/L Final  . ALT 02/13/2019 62* 0 - 44 IU/L Final  . Cholesterol, Total 02/13/2019 158  100 - 199 mg/dL Final  . Triglycerides 02/13/2019 112  0 - 149 mg/dL Final  . HDL 02/13/2019 41  >39 mg/dL Final  . VLDL Cholesterol Cal 02/13/2019 20  5 - 40 mg/dL Final  . LDL Chol Calc (NIH) 02/13/2019 97  0 - 99 mg/dL Final  . Chol/HDL Ratio 02/13/2019 3.9  0.0 - 5.0 ratio Final   Comment:                                   T. Chol/HDL Ratio                                             Men  Women                               1/2 Avg.Risk  3.4    3.3                                   Avg.Risk  5.0    4.4                                2X Avg.Risk  9.6    7.1                                3X Avg.Risk 23.4   11.0     Allergies: Penicillins, Latex, and Tape  PTA Medications: (Not in a hospital admission)   Medical Decision Making  Patient has labs and Covid ordered Place in [facility] continuous obs unit Medications restarted Seroquel 200 mg PO QHS and 50 mg PO BID    Recommendations  Based on my evaluation the patient does not appear to have an emergency medical condition.  [place], FNP 07/11/19  11:27 AM

## 2019-07-11 NOTE — ED Notes (Signed)
Pt A&O x 4, sleeping at present, no distress noted,calm & cooperative at present,  Monitoring for safety.

## 2019-07-11 NOTE — ED Notes (Signed)
Pt resting with eyes closed. Lying on back snoring. Observed rise and fall of chest. Monitor for safety on unit

## 2019-07-11 NOTE — BH Assessment (Signed)
Comprehensive Clinical Assessment (CCA) Screening, Triage and Referral Note  07/11/2019 Dylan [name] [id number] Patient presents this date voluntary with ongoing S/I. Patient voices multiple plans to self harm and is making bizarre statements in reference to a plan to take his life stating he "would like to cut his arm off but it would grow back." Patient renders limited history on arrival due to current altered mental state. Patient states he has been receiving services from [facility] who assists with medication management for symptoms associated with his Schizoaffective disorder. Patient reports he last met with that provider one week ago for therapy although states he has not taken his medication/s in five days. Patient when questioned in reference to why he is currently non compliant with his medications cannot provide a answer. Patient is observed to be very tangential and difficult to redirect at times. History is limited per chart review. Patient reports sporadic SA use to include "having a beer every once in a while" with last reported use on 07/10/19 when he reported he "drank a beer." Patient also states he "took a couple percocet/s yesterday" although does not provide details. Patient reports a prior attempt to self harm although due to current mental state patient renders limited history in reference to that event. Patient will not answer orientation questions. Patient denies any H/I although reports active AVH stating he "sees and hears shadow people." Patient's demeanor is observed to change as the assessment progresses with patient becoming more agitated and seems to be responding to internal stimuli. Patient is observed to be having conversations with his "shadow people" when questioned. Patient keeps repeating he "just wants to die and be in the darkness forever."   Patient is observed to be disorganized and memory/thoughts are not intact.  Patient will not answer orientation questions and  appears to be responding to internal stimuli. Case was staffed with Money NP who recommended patient be observed and monitored. Patient will be evaluated for possible medication interventions.            Visit Diagnosis: Schizoaffective D/O (per notes)     ICD-10-CM   1. Schizoaffective disorder, bipolar type (HCC)  F25.0     Patient Reported Information How did you hear about [place]? Self   Referral name: Self   Referral phone number: No data recorded Whom do you see for routine medical problems? I don't have a doctor   Practice/Facility Name: No data recorded  Practice/Facility Phone Number: No data recorded  Name of Contact: No data recorded  Contact Number: No data recorded  Contact Fax Number: No data recorded  Prescriber Name: No data recorded  Prescriber Address (if known): No data recorded What Is the Reason for Your Visit/Call Today? S/I and ongoing AVH  How Long Has This Been Causing You Problems? <Week  Have You Recently Been in Any Inpatient Treatment (Hospital/Detox/Crisis Center/28-Day Program)? No   Name/Location of Program/Hospital:No data recorded  How Long Were You There? No data recorded  When Were You Discharged? No data recorded Have You Ever Received Services From [facility] Before? No   Who Do You See at [facility]? No data recorded Have You Recently Had Any Thoughts About Hurting Yourself? Yes   Are You Planning to Commit Suicide/Harm Yourself At This time?  Yes  Have you Recently Had Thoughts About Hurting Someone Karolee Ohs? No   Explanation: No data recorded Have You Used Any Alcohol or Drugs in the Past 24 Hours? No   How Long Ago Did  You Use Drugs or Alcohol?  No data recorded  What Did You Use and How Much? No data recorded What Do You Feel Would Help You the Most Today? Medication  Do You Currently Have a Therapist/Psychiatrist? Yes   Name of Therapist/Psychiatrist: [facility]   Have You Been Recently Discharged From Any Office Practice or  Programs? No   Explanation of Discharge From Practice/Program:  No data recorded    CCA Screening Triage Referral Assessment Type of Contact: Face-to-Face   Is this Initial or Reassessment? No data recorded  Date Telepsych consult ordered in [facility]:  No data recorded  Time Telepsych consult ordered in [facility]:  No data recorded Patient Reported Information Reviewed? Yes   Patient Left Without Being Seen? No data recorded  Reason for Not Completing Assessment: No data recorded Collateral Involvement: NA  Does Patient Have a Turner? No data recorded  Name and Contact of Legal Guardian:  No data recorded If Minor and Not Living with Parent(s), Who has Custody? NA  Is CPS involved or ever been involved? Never  Is APS involved or ever been involved? Never  Patient Determined To Be At Risk for Harm To Self or Others Based on Review of Patient Reported Information or Presenting Complaint? Yes, for Self-Harm   Method: No data recorded  Availability of Means: No data recorded  Intent: No data recorded  Notification Required: No data recorded  Additional Information for Danger to Others Potential:  No data recorded  Additional Comments for Danger to Others Potential:  No data recorded  Are There Guns or Other Weapons in Your Home?  No data recorded   Types of Guns/Weapons: No data recorded   Are These Weapons Safely Secured?                              No data recorded   Who Could Verify You Are Able To Have These Secured:    No data recorded Do You Have any Outstanding Charges, Pending Court Dates, Parole/Probation? No data recorded Contacted To Inform of Risk of Harm To Self or Others: No data recorded Location of Assessment: [facility] Assessment Services  Does Patient Present under Involuntary Commitment? No   IVC Papers Initial File Date: No data recorded  [place] of Residence: [place]  Patient Currently Receiving the Following Services: No data  recorded  Determination of Need: No data recorded  Options For Referral: No data recorded  Mamie Nick, LCAS

## 2019-07-11 NOTE — ED Notes (Signed)
Pt resting with eyes closed. Rise and fall of chest noted. Laying on right side. Will continue to monitor for safety

## 2019-07-12 MED ORDER — QUETIAPINE FUMARATE 200 MG PO TABS: 200.0000 mg | ORAL_TABLET | Freq: Every day | ORAL | 0 refills | Status: DC

## 2019-07-12 NOTE — ED Provider Notes (Signed)
FBC/OBS ASAP Discharge Summary  Date and Time: 07/12/2019 11:49 AM  Name: Dylan [name]  MRN:  [id number]   Discharge Diagnoses:  Final diagnoses:  Schizoaffective disorder, bipolar type ([place])    Subjective: " I was having hallucinations"   Stay Summary: Patient presented to [facility] as a walk-in with complaints of hallucinations and the feeling someone was raping him. He also stated he was suicidal on admission.  He stated he normally goes to [facility] to get his mental health medications but due to his therapist being on maternity leave he has not had his Seroquel for the past week or so. He received Seroquel 200 mg p.o. at bedtime while at [facility]. Today he stated the voices are better and he is no longer suicidal. He denies homicidal ideation and denies a plan or intent of suicide. He denies visual and tactile hallucinations. He has been calm and cooperative while under observation. He slept well. He has a job and a place to live. He was provided with a 14 days supply of Seroquel 200 mg at bedtime, sent electronically to [facility] on [address]. He will follow up with [facility] after discharge.  His UDS was positive for Cocaine on admission. Patient stated he does not know how that happened. Patient is no a danger to self or others. He is requesting a work note for his job. Patient is stable and ready for discharge home.   Total Time spent with patient: 30 minutes  Past Psychiatric History: Schizophrenia Depression Bipolar 1 Disorder HIV Hepatitis C Past Medical History:  Past Medical History:  Diagnosis Date  . Anxiety   . Arthritis   . Bipolar 1 disorder ([place])   . Colon polyps   . Depression   . GERD (gastroesophageal reflux disease)   . Hepatitis B   . Human immunodeficiency virus (HIV) ([place])   . Hyperlipidemia   . Hypertension   . Schizophrenia [facility])     Past Surgical History:  Procedure Laterality Date  . IR FLUORO GUIDE CV LINE RIGHT  02/22/2017  . IR US GUIDE VASC ACCESS  RIGHT  02/22/2017  . TUMOR REMOVAL     From Chest   Family History:  Family History  Problem Relation Age of Onset  . CAD Mother   . Diabetes Father   . Colon cancer Maternal Aunt 72  . Diabetes Maternal Aunt   . Heart disease Maternal Uncle   . Esophageal cancer Neg Hx   . Rectal cancer Neg Hx   . Stomach cancer Neg Hx    Family Psychiatric History: Unknown Social History:  Social History   Substance and Sexual Activity  Alcohol Use Yes   Comment: occassional use on weekends     Social History   Substance and Sexual Activity  Drug Use Not Currently  . Types: Cocaine   Comment: past    Social History   Socioeconomic History  . Marital status: Married    Spouse name: Not on file  . Number of children: 1  . Years of education: Not on file  . Highest education level: Not on file  Occupational History  . Not on file  Tobacco Use  . Smoking status: Current Every Day Smoker    Packs/day: 0.50    Years: 15.00    Pack years: 7.50    Types: Cigarettes  . Smokeless tobacco: Never Used  . Tobacco comment: has patches when he is ready to quit  Vaping Use  . Vaping Use: Never used  Substance and Sexual Activity  . Alcohol use: Yes    Comment: occassional use on weekends  . Drug use: Not Currently    Types: Cocaine    Comment: past  . Sexual activity: Yes    Partners: Female    Comment: condoms given  Other Topics Concern  . Not on file  Social History Narrative  . Not on file   Social Determinants of Health   Financial Resource Strain:   . Difficulty of Paying Living Expenses:   Food Insecurity:   . Worried About Charity fundraiser in the Last Year:   . Arboriculturist in the Last Year:   Transportation Needs:   . Film/video editor (Medical):   Marland Kitchen Lack of Transportation (Non-Medical):   Physical Activity:   . Days of Exercise per Week:   . Minutes of Exercise per Session:   Stress:   . Feeling of Stress :   Social Connections:   . Frequency of  Communication with Friends and Family:   . Frequency of Social Gatherings with Friends and Family:   . Attends Religious Services:   . Active Member of Clubs or Organizations:   . Attends Archivist Meetings:   Marland Kitchen Marital Status:    SDOH:  SDOH Screenings   Alcohol Screen:   . Last Alcohol Screening Score (AUDIT):   Depression (PHQ2-9): Low Risk   . PHQ-2 Score: 1  Financial Resource Strain:   . Difficulty of Paying Living Expenses:   Food Insecurity:   . Worried About Charity fundraiser in the Last Year:   . [place] in the Last Year:   Housing:   . Last Housing Risk Score:   Physical Activity:   . Days of Exercise per Week:   . Minutes of Exercise per Session:   Social Connections:   . Frequency of Communication with Friends and Family:   . Frequency of Social Gatherings with Friends and Family:   . Attends Religious Services:   . Active Member of Clubs or Organizations:   . Attends Archivist Meetings:   Marland Kitchen Marital Status:   Stress:   . Feeling of Stress :   Tobacco Use: High Risk  . Smoking Tobacco Use: Current Every Day Smoker  . Smokeless Tobacco Use: Never Used  Transportation Needs:   . Film/video editor (Medical):   Marland Kitchen Lack of Transportation (Non-Medical):     Has this patient used any form of tobacco in the last 30 days? (Cigarettes, Smokeless Tobacco, Cigars, and/or Pipes) Prescription not provided because: Patient declined  Current Medications:  Current Facility-Administered Medications  Medication Dose Route Frequency Provider Last Rate Last Admin  . acetaminophen (TYLENOL) tablet 650 mg  650 mg Oral Q6H PRN Money, Lowry Ram, FNP      . alum & mag hydroxide-simeth (MAALOX/MYLANTA) 200-200-20 MG/5ML suspension 30 mL  30 mL Oral Q4H PRN Money, Lowry Ram, FNP      . bictegravir-emtricitabine-tenofovir AF (BIKTARVY) 50-200-25 MG per tablet 1 tablet  1 tablet Oral Daily Money, Dwane Andres, FNP   1 tablet at 07/12/19 1045  . gabapentin  (NEURONTIN) capsule 300 mg  300 mg Oral BID Money, [name] B, FNP   300 mg at 07/12/19 1044  . hydrOXYzine (ATARAX/VISTARIL) tablet 25 mg  25 mg Oral TID PRN Money, Lowry Ram, FNP      . magnesium hydroxide (MILK OF MAGNESIA) suspension 30 mL  30 mL Oral Daily PRN  Money, Lowry Ram, FNP      . QUEtiapine (SEROQUEL) tablet 200 mg  200 mg Oral QHS Money, [name] B, FNP   200 mg at 07/11/19 2300  . QUEtiapine (SEROQUEL) tablet 50 mg  50 mg Oral BID Money, Lowry Ram, FNP   50 mg at 07/11/19 2301  . rosuvastatin (CRESTOR) tablet 20 mg  20 mg Oral Daily Money, Jeffie Spivack, FNP   20 mg at 07/11/19 1307   Current Outpatient Medications  Medication Sig Dispense Refill  . bictegravir-emtricitabine-tenofovir AF (BIKTARVY) 50-200-25 MG TABS tablet Take 1 tablet by mouth daily. 30 tablet 5  . gabapentin (NEURONTIN) 300 MG capsule TAKE 1 CAPSULE(300 MG) BY MOUTH TWICE DAILY (Patient taking differently: Take 300 mg by mouth 2 (two) times daily. ) 60 capsule 3  . hydrOXYzine (ATARAX/VISTARIL) 25 MG tablet Take 25 mg by mouth 2 (two) times daily before lunch and supper.     . rosuvastatin (CRESTOR) 20 MG tablet Take 1 tablet (20 mg total) by mouth daily. 30 tablet 4  . traZODone (DESYREL) 100 MG tablet Take 2 tablets (200 mg total) by mouth at bedtime. For insomnia. 30 tablet 0  . QUEtiapine (SEROQUEL) 200 MG tablet Take 1 tablet (200 mg total) by mouth at bedtime. 14 tablet 0    PTA Medications: (Not in a hospital admission)   Musculoskeletal  Strength & Muscle Tone: within normal limits Gait & Station: normal Patient leans: N/A  Psychiatric Specialty Exam  Presentation  General Appearance: Disheveled  Eye Contact:No data recorded Speech:No data recorded Speech Volume:No data recorded Handedness:No data recorded  Mood and Affect  Mood:No data recorded Affect:No data recorded  Thought Process  Thought Processes:No data recorded Descriptions of Associations:No data recorded Orientation:No data  recorded Thought Content:No data recorded Hallucinations:No data recorded Ideas of Reference:No data recorded Suicidal Thoughts:No data recorded Homicidal Thoughts:No data recorded  Sensorium  Memory:No data recorded Judgment:No data recorded Insight:No data recorded  Executive Functions  Concentration:No data recorded Attention Span:No data recorded Recall:No data recorded Fund of Knowledge:No data recorded Language:No data recorded  Psychomotor Activity  Psychomotor Activity:No data recorded  Assets  Assets:No data recorded  Sleep  Sleep:No data recorded  Physical Exam  Physical Exam ROS Blood pressure 108/74, pulse 75, temperature 98.2 F (36.8 C), temperature source Skin, resp. rate 18, height 6\' 3"  (1.905 m), weight 230 lb (104.3 kg), SpO2 99 %. Body mass index is 28.75 kg/m.  Demographic Factors:  Male, Low socioeconomic status and Living alone  Loss Factors: Financial problems/change in socioeconomic status  Historical Factors: Substance abuse  Risk Reduction Factors:   Sense of responsibility to family  Continued Clinical Symptoms:  Bipolar Disorder:   Bipolar II Depression:   Comorbid alcohol abuse/dependence Alcohol/Substance Abuse/Dependencies Schizophrenia:   Paranoid or undifferentiated type More than one psychiatric diagnosis Previous Psychiatric Diagnoses and Treatments  Cognitive Features That Contribute To Risk:  Closed-mindedness    Suicide Risk:  Minimal: No identifiable suicidal ideation.  Patients presenting with no risk factors but with morbid ruminations; may be classified as minimal risk based on the severity of the depressive symptoms  Plan Of Care/Follow-up recommendations:  Activity:  As tolerated Diet:  Heart Healthy  Disposition: Discharge [place], NP 07/12/2019, 11:49 AM

## 2019-07-12 NOTE — ED Notes (Signed)
Pt is preparing for DC.

## 2019-07-12 NOTE — ED Notes (Signed)
Pt A&O x 4, sleeping at present, no distress noted, calm & cooperative.  Monitoring for safety.

## 2019-07-12 NOTE — ED Notes (Signed)
Pt resting at present, no distress noted, resting comfortably, monitoring for safety.

## 2019-07-12 NOTE — ED Notes (Signed)
Pt alert and oriented on the unit and denies SI/HI, A/VH. Education, support and encouragement provided. Medications administered per MD orders. No reactions/side effects to medicine noted. Pt denies any concerns at this time and verbally contracts for safety. Pt ambulating on the unit with no issues. Pt remains safe on the unit.

## 2019-07-12 NOTE — ED Notes (Signed)
D: Pt alert and oriented on the unit.   A: Education, support, and encouragement provided. Discharge summary, medications and follow up appointments reviewed with pt. Suicide prevention resources provided and pt's belongings in locker returned.   R: Pt denies SI/HI, A/VH, or any concerns at this time. Pt ambulatory on and off unit. Pt discharged to lobby.

## 2019-07-12 NOTE — Discharge Instructions (Addendum)
Discharge Home.

## 2019-07-17 ENCOUNTER — Encounter: Payer: Self-pay | Admitting: Orthopedic Surgery

## 2019-07-22 ENCOUNTER — Encounter: Payer: Self-pay | Admitting: Family Medicine

## 2019-07-22 ENCOUNTER — Other Ambulatory Visit: Payer: Self-pay

## 2019-07-22 ENCOUNTER — Ambulatory Visit: Payer: Medicaid Other | Attending: Family Medicine | Admitting: Family Medicine

## 2019-07-22 VITALS — BP 105/66 | HR 76 | Ht 74.0 in | Wt 237.4 lb

## 2019-07-22 DIAGNOSIS — Z833 Family history of diabetes mellitus: Secondary | ICD-10-CM | POA: Insufficient documentation

## 2019-07-22 DIAGNOSIS — K219 Gastro-esophageal reflux disease without esophagitis: Secondary | ICD-10-CM | POA: Diagnosis not present

## 2019-07-22 DIAGNOSIS — Z91040 Latex allergy status: Secondary | ICD-10-CM | POA: Diagnosis not present

## 2019-07-22 DIAGNOSIS — I10 Essential (primary) hypertension: Secondary | ICD-10-CM | POA: Insufficient documentation

## 2019-07-22 DIAGNOSIS — Z8249 Family history of ischemic heart disease and other diseases of the circulatory system: Secondary | ICD-10-CM | POA: Insufficient documentation

## 2019-07-22 DIAGNOSIS — H6123 Impacted cerumen, bilateral: Secondary | ICD-10-CM | POA: Insufficient documentation

## 2019-07-22 DIAGNOSIS — R7303 Prediabetes: Secondary | ICD-10-CM | POA: Insufficient documentation

## 2019-07-22 DIAGNOSIS — F250 Schizoaffective disorder, bipolar type: Secondary | ICD-10-CM

## 2019-07-22 DIAGNOSIS — Z79899 Other long term (current) drug therapy: Secondary | ICD-10-CM | POA: Diagnosis not present

## 2019-07-22 DIAGNOSIS — E785 Hyperlipidemia, unspecified: Secondary | ICD-10-CM | POA: Diagnosis not present

## 2019-07-22 DIAGNOSIS — M1990 Unspecified osteoarthritis, unspecified site: Secondary | ICD-10-CM | POA: Insufficient documentation

## 2019-07-22 DIAGNOSIS — Z21 Asymptomatic human immunodeficiency virus [HIV] infection status: Secondary | ICD-10-CM | POA: Diagnosis not present

## 2019-07-22 DIAGNOSIS — Z880 Allergy status to penicillin: Secondary | ICD-10-CM | POA: Diagnosis not present

## 2019-07-22 DIAGNOSIS — G629 Polyneuropathy, unspecified: Secondary | ICD-10-CM | POA: Diagnosis not present

## 2019-07-22 MED ORDER — GABAPENTIN 300 MG PO CAPS: 600.0000 mg | ORAL_CAPSULE | Freq: Every day | ORAL | 3 refills | Status: DC

## 2019-07-22 NOTE — Patient Instructions (Signed)

## 2019-07-22 NOTE — Progress Notes (Signed)
Subjective:  Patient ID: Dylan [name], male    DOB: Apr 09, 1965  Age: 54 y.o. MRN: [id number]  CC: Foot Pain   HPI [name] [facility] a22 year old male with a history of HIV (on antiretroviral therapy), hypertension, schizoaffectivedisorder, chronic left knee pain, right second metatarsal fracture who presents today for a follow-up visit.  His neuropathy in his feet is uncontrolled and screening for diabetes had revealed A1c of 6.0.  He has been on gabapentin which was increased at his last office visit to 300 mg twice daily. Gabapentin made him sedated and he cut back on Gabapentin to 300 mg at bedtime. He is on Biktarvy for HIV  Goes to [place] for mental health care  L ear felt clogged and he used an OTC steroid neurolytic with some improvement but he does still hear some muffled sounds in his left ear.  Denies presence of ear pain. Past Medical History:  Diagnosis Date  . Anxiety   . Arthritis   . Bipolar 1 disorder ([place])   . Colon polyps   . Depression   . GERD (gastroesophageal reflux disease)   . Hepatitis B   . Human immunodeficiency virus (HIV) ([place])   . Hyperlipidemia   . Hypertension   . Schizophrenia [facility])     Past Surgical History:  Procedure Laterality Date  . IR FLUORO GUIDE CV LINE RIGHT  02/22/2017  . IR US GUIDE VASC ACCESS RIGHT  02/22/2017  . TUMOR REMOVAL     From Chest    Family History  Problem Relation Age of Onset  . CAD Mother   . Diabetes Father   . Colon cancer Maternal Aunt 20  . Diabetes Maternal Aunt   . Heart disease Maternal Uncle   . Esophageal cancer Neg Hx   . Rectal cancer Neg Hx   . Stomach cancer Neg Hx     Allergies  Allergen Reactions  . Penicillins Other (See Comments)    Childhood allergy  . Latex Rash  . Tape Rash    Outpatient Medications Prior to Visit  Medication Sig Dispense Refill  . QUEtiapine (SEROQUEL) 200 MG tablet Take 1 tablet (200 mg total) by mouth at bedtime. 14 tablet 0  . traZODone  (DESYREL) 100 MG tablet Take 2 tablets (200 mg total) by mouth at bedtime. For insomnia. 30 tablet 0   No facility-administered medications prior to visit.     ROS Review of Systems  Constitutional: Negative for activity change and appetite change.  HENT: Negative for sinus pressure and sore throat.   Eyes: Negative for visual disturbance.  Respiratory: Negative for cough, chest tightness and shortness of breath.   Cardiovascular: Negative for chest pain and leg swelling.  Gastrointestinal: Negative for abdominal distention, abdominal pain, constipation and diarrhea.  Endocrine: Negative.   Genitourinary: Negative for dysuria.  Musculoskeletal: Negative for joint swelling and myalgias.  Skin: Negative for rash.  Allergic/Immunologic: Negative.   Neurological: Positive for numbness. Negative for weakness and light-headedness.  Psychiatric/Behavioral: Negative for dysphoric mood and suicidal ideas.    Objective:  BP 105/66   Pulse 76   Ht 6\' 2"  (1.88 m)   Wt 237 lb 6.4 oz (107.7 kg)   SpO2 98%   BMI 30.48 kg/m   BP/Weight 07/22/2019 04/15/2019 00/17/4944  Systolic BP 967 591 638  Diastolic BP 66 83 67  Wt. (Lbs) 237.4 238 240  BMI 30.48 30.56 30.81  Some encounter information is confidential and restricted. Go to Review Flowsheets activity to  see all data.      Physical Exam Constitutional:      Appearance: He is well-developed.  HENT:     Ears:     Comments: Cerumen impacting both tympanic membranes Neck:     Vascular: No JVD.  Cardiovascular:     Rate and Rhythm: Normal rate.     Heart sounds: Normal heart sounds. No murmur heard.   Pulmonary:     Effort: Pulmonary effort is normal.     Breath sounds: Normal breath sounds. No wheezing or rales.  Chest:     Chest wall: No tenderness.  Abdominal:     General: Bowel sounds are normal. There is no distension.     Palpations: Abdomen is soft. There is no mass.     Tenderness: There is no abdominal tenderness.    Musculoskeletal:        General: Normal range of motion.     Right lower leg: No edema.     Left lower leg: No edema.  Neurological:     Mental Status: He is alert and oriented to person, place, and time.  Psychiatric:        Mood and Affect: Mood normal.     CMP Latest Ref Rng & Units 07/11/2019 02/13/2019 09/11/2018  Glucose 70 - 99 mg/dL 93 93 80  BUN 6 - 20 mg/dL 15 18 20   Creatinine 0.61 - 1.24 mg/dL 0.88 1.25 1.24  Sodium 135 - 145 mmol/L 136 137 137  Potassium 3.5 - 5.1 mmol/L 3.7 4.5 4.3  Chloride 98 - 111 mmol/L 103 105 104  CO2 22 - 32 mmol/L 24 18(L) 25  Calcium 8.9 - 10.3 mg/dL 9.7 9.8 9.9  Total Protein 6.5 - 8.1 g/dL 7.9 7.4 7.4  Total Bilirubin 0.3 - 1.2 mg/dL 0.8 0.3 0.4  Alkaline Phos 38 - 126 U/L 86 85 -  AST 15 - 41 U/L 38 46(H) 35  ALT 0 - 44 U/L 54(H) 62(H) 40    Lipid Panel     Component Value Date/Time   CHOL 198 07/11/2019 1140   CHOL 158 02/13/2019 1128   TRIG 52 07/11/2019 1140   HDL 52 07/11/2019 1140   HDL 41 02/13/2019 1128   CHOLHDL 3.8 07/11/2019 1140   VLDL 10 07/11/2019 1140   LDLCALC 136 (H) 07/11/2019 1140   LDLCALC 97 02/13/2019 1128   LDLCALC 149 (H) 06/06/2018 1406    CBC    Component Value Date/Time   WBC 10.6 (H) 07/11/2019 1140   RBC 4.83 07/11/2019 1140   HGB 14.9 07/11/2019 1140   HCT 44.7 07/11/2019 1140   PLT 180 07/11/2019 1140   MCV 92.5 07/11/2019 1140   MCH 30.8 07/11/2019 1140   MCHC 33.3 07/11/2019 1140   RDW 13.0 07/11/2019 1140   LYMPHSABS 1.8 07/11/2019 1140   MONOABS 0.6 07/11/2019 1140   EOSABS 0.0 07/11/2019 1140   BASOSABS 0.0 07/11/2019 1140    Lab Results  Component Value Date   HGBA1C 6.0 (H) 07/11/2019    Assessment & Plan:  1. Bilateral impacted cerumen He is in a hurry today but we will schedule him on the nurse visit for bilateral irrigation  2. Prediabetes Prediabetes with A1c of 6.0 We have provided education on lifestyle modification and diabetic diet  3. Schizoaffective disorder,  bipolar type (HCC) Stable Currently on Seroquel and trazodone Managed by [facility]  4. Neuropathy Uncontrolled Peripheral neuropathy is a side effect of Biktarvy which is his antiretroviral medication Increase gabapentin  to 2 tablets at bedtime If symptoms persist we may have to switch him to Lyrica given the problems he is having with sedation on gabapentin - gabapentin (NEURONTIN) 300 MG capsule; Take 2 capsules (600 mg total) by mouth at bedtime.  Dispense: 60 capsule; Refill: 3   Meds ordered this encounter  Medications  . gabapentin (NEURONTIN) 300 MG capsule    Sig: Take 2 capsules (600 mg total) by mouth at bedtime.    Dispense:  60 capsule    Refill:  3    Follow-up: Return in about 3 months (around 10/22/2019) for Chronic disease management.       Charlott Rakes, MD, FAAFP. [facility] and [place]t [place], [place]   07/22/2019, 1:07 PM

## 2019-07-22 NOTE — Progress Notes (Signed)
States that he is having very bad neuropathy pain in his feet and toes.  Cant hear out of left ear, tries OTC ear drops and he feel a little difference.

## 2019-07-24 ENCOUNTER — Other Ambulatory Visit: Payer: Self-pay

## 2019-07-24 ENCOUNTER — Ambulatory Visit: Payer: Medicaid Other | Attending: Family Medicine

## 2019-07-24 VITALS — Temp 98.1°F

## 2019-07-24 DIAGNOSIS — H6123 Impacted cerumen, bilateral: Secondary | ICD-10-CM | POA: Diagnosis not present

## 2019-07-24 NOTE — Progress Notes (Signed)
Pt is here as instructed by PCP for ear irrigation.  DOB and Name verified  Ceruminous is noted.  Wax is removed with the use of the ear irrigator. Gravity was used, pt had head tilted during procedure. Procedure well tolerated. No pain or dizziness verbalized during or after the. Instructions for home care to prevent wax buildup are given. Verbalized understanding

## 2019-07-30 ENCOUNTER — Other Ambulatory Visit: Payer: Self-pay | Admitting: Physician Assistant

## 2019-08-01 ENCOUNTER — Telehealth: Payer: Self-pay

## 2019-08-01 NOTE — Telephone Encounter (Signed)
Please cx surgery for 08/06/19 The lisfranc joint fusion. The pt's mother has passed away and he will let us know when he is able to reschedule.

## 2019-08-06 ENCOUNTER — Encounter (HOSPITAL_COMMUNITY): Admission: RE | Payer: Self-pay | Source: Home / Self Care

## 2019-08-06 ENCOUNTER — Ambulatory Visit (HOSPITAL_COMMUNITY): Admission: RE | Admit: 2019-08-06 | Payer: Medicaid Other | Source: Home / Self Care | Admitting: Orthopedic Surgery

## 2019-08-06 SURGERY — FUSION, JOINT, FOOT
Anesthesia: Choice | Site: Foot | Laterality: Right

## 2019-08-13 ENCOUNTER — Telehealth: Payer: Self-pay

## 2019-08-13 NOTE — Telephone Encounter (Signed)
Received call today from Jens Som at [facility] with some questions regarding patient's diagnosis. States in order for patient to get enrolled with them office needs to state patient is asymptomatic and has limitation that interrupt him from working.  Program is help find employment. Asked caller to fax forms for MD to review. Will have provider review.  [place]

## 2019-09-02 ENCOUNTER — Encounter: Payer: Self-pay | Admitting: Orthopedic Surgery

## 2019-09-09 ENCOUNTER — Other Ambulatory Visit: Payer: Self-pay | Admitting: Physician Assistant

## 2019-09-10 ENCOUNTER — Other Ambulatory Visit: Payer: Self-pay

## 2019-09-10 HISTORY — PX: FOOT ARTHRODESIS: SHX1655

## 2019-09-12 ENCOUNTER — Encounter (HOSPITAL_BASED_OUTPATIENT_CLINIC_OR_DEPARTMENT_OTHER): Payer: Self-pay | Admitting: Orthopedic Surgery

## 2019-09-12 ENCOUNTER — Other Ambulatory Visit (HOSPITAL_COMMUNITY): Payer: Medicaid Other

## 2019-09-12 NOTE — Progress Notes (Signed)
Patient's pre-op call was completed and when I told him he had to go to [employer] for covid he said he had no car and could not go. I asked if he had a friend or family member to take him and he said no. I explained that he had to have a covid test before surgery and he states that it was not possible for him to go at this time and hung up on me. I called and left [name] at Dr Jess Barters office a message to let her know that patient will need a covid test before surgery. Dylan [name]

## 2019-09-16 ENCOUNTER — Ambulatory Visit (HOSPITAL_BASED_OUTPATIENT_CLINIC_OR_DEPARTMENT_OTHER): Admission: RE | Admit: 2019-09-16 | Payer: Medicaid Other | Source: Ambulatory Visit | Admitting: Orthopedic Surgery

## 2019-09-16 ENCOUNTER — Other Ambulatory Visit: Payer: Self-pay | Admitting: Physician Assistant

## 2019-09-16 ENCOUNTER — Encounter (HOSPITAL_BASED_OUTPATIENT_CLINIC_OR_DEPARTMENT_OTHER): Admission: RE | Payer: Self-pay | Source: Ambulatory Visit

## 2019-09-16 SURGERY — FUSION, JOINT, FOOT
Anesthesia: Choice | Site: Foot | Laterality: Right

## 2019-09-16 NOTE — Telephone Encounter (Signed)
Dr. Jess Barters patient

## 2019-09-16 NOTE — Progress Notes (Addendum)
I was unable to reach Mr. Hepburn on his phone- VM full, I sent SMS # x 2.  I was able to speak to Mr Peach's sister, Olivia Mackie, she took the information and will notify [place].  No food after midnight, no gum or hard candy. Patient may drink clear liquids until 1100 - I listed Water, Gatorade, Soda, Tea, Apple or Cranberry juice only.  Arrive at 1100. Shower, wear clean clothes, brush teeth, no lotions , powders, colognes, jewelry, prercings.no valuables. DO not take any medications, we do not know what he takes. Has to have a driver home and someone to stay with him for the 1st 24 hours after surgery. No smoking.

## 2019-09-17 ENCOUNTER — Encounter (HOSPITAL_COMMUNITY)
Admission: RE | Disposition: A | Discharge status: Home / Self Care | Payer: Self-pay | Source: Home / Self Care | Attending: Orthopedic Surgery

## 2019-09-17 ENCOUNTER — Ambulatory Visit (HOSPITAL_COMMUNITY): Payer: Medicaid Other | Admitting: Anesthesiology

## 2019-09-17 ENCOUNTER — Ambulatory Visit (HOSPITAL_COMMUNITY): Payer: Medicaid Other

## 2019-09-17 ENCOUNTER — Other Ambulatory Visit: Payer: Self-pay

## 2019-09-17 ENCOUNTER — Ambulatory Visit (HOSPITAL_COMMUNITY)
Admission: RE | Admit: 2019-09-17 | Discharge: 2019-09-17 | Disposition: A | Discharge status: Home / Self Care | Payer: Medicaid Other | Attending: Orthopedic Surgery | Admitting: Orthopedic Surgery

## 2019-09-17 ENCOUNTER — Encounter (HOSPITAL_COMMUNITY): Payer: Self-pay | Admitting: Orthopedic Surgery

## 2019-09-17 DIAGNOSIS — W11XXXA Fall on and from ladder, initial encounter: Secondary | ICD-10-CM | POA: Insufficient documentation

## 2019-09-17 DIAGNOSIS — M1990 Unspecified osteoarthritis, unspecified site: Secondary | ICD-10-CM | POA: Diagnosis not present

## 2019-09-17 DIAGNOSIS — Z91040 Latex allergy status: Secondary | ICD-10-CM | POA: Insufficient documentation

## 2019-09-17 DIAGNOSIS — Z20822 Contact with and (suspected) exposure to covid-19: Secondary | ICD-10-CM | POA: Insufficient documentation

## 2019-09-17 DIAGNOSIS — Z8249 Family history of ischemic heart disease and other diseases of the circulatory system: Secondary | ICD-10-CM | POA: Diagnosis not present

## 2019-09-17 DIAGNOSIS — Z21 Asymptomatic human immunodeficiency virus [HIV] infection status: Secondary | ICD-10-CM | POA: Insufficient documentation

## 2019-09-17 DIAGNOSIS — Z880 Allergy status to penicillin: Secondary | ICD-10-CM | POA: Diagnosis not present

## 2019-09-17 DIAGNOSIS — S93324A Dislocation of tarsometatarsal joint of right foot, initial encounter: Secondary | ICD-10-CM

## 2019-09-17 DIAGNOSIS — M19079 Primary osteoarthritis, unspecified ankle and foot: Secondary | ICD-10-CM

## 2019-09-17 DIAGNOSIS — Z888 Allergy status to other drugs, medicaments and biological substances status: Secondary | ICD-10-CM | POA: Diagnosis not present

## 2019-09-17 DIAGNOSIS — F209 Schizophrenia, unspecified: Secondary | ICD-10-CM | POA: Diagnosis not present

## 2019-09-17 DIAGNOSIS — F17210 Nicotine dependence, cigarettes, uncomplicated: Secondary | ICD-10-CM | POA: Insufficient documentation

## 2019-09-17 DIAGNOSIS — I10 Essential (primary) hypertension: Secondary | ICD-10-CM | POA: Insufficient documentation

## 2019-09-17 DIAGNOSIS — F319 Bipolar disorder, unspecified: Secondary | ICD-10-CM | POA: Insufficient documentation

## 2019-09-17 HISTORY — DX: Polyneuropathy, unspecified: G62.9

## 2019-09-17 HISTORY — PX: FOOT ARTHRODESIS: SHX1655

## 2019-09-17 HISTORY — DX: Myoneural disorder, unspecified: G70.9

## 2019-09-17 HISTORY — DX: Prediabetes: R73.03

## 2019-09-17 LAB — COMPREHENSIVE METABOLIC PANEL
ALT: 49 U/L — ABNORMAL HIGH (ref 0–44)
AST: 40 U/L (ref 15–41)
Albumin: 4 g/dL (ref 3.5–5.0)
Alkaline Phosphatase: 77 U/L (ref 38–126)
Anion gap: 10 (ref 5–15)
BUN: 11 mg/dL (ref 6–20)
CO2: 23 mmol/L (ref 22–32)
Calcium: 9.4 mg/dL (ref 8.9–10.3)
Chloride: 105 mmol/L (ref 98–111)
Creatinine, Ser: 1.07 mg/dL (ref 0.61–1.24)
GFR calc Af Amer: >60 mL/min (ref >60)
GFR calc non Af Amer: >60 mL/min (ref >60)
Glucose, Bld: 97 mg/dL (ref 70–99)
Potassium: 4.1 mmol/L (ref 3.5–5.1)
Sodium: 138 mmol/L (ref 135–145)
Total Bilirubin: 0.7 mg/dL (ref 0.3–1.2)
Total Protein: 7.5 g/dL (ref 6.5–8.1)

## 2019-09-17 LAB — SARS CORONAVIRUS 2 BY RT PCR (HOSPITAL ORDER, PERFORMED IN ~~LOC~~ HOSPITAL LAB): SARS Coronavirus 2: NEGATIVE

## 2019-09-17 LAB — CBC
HCT: 48.7 % (ref 39.0–52.0)
Hemoglobin: 16.1 g/dL (ref 13.0–17.0)
MCH: 31.1 pg (ref 26.0–34.0)
MCHC: 33.1 g/dL (ref 30.0–36.0)
MCV: 94 fL (ref 80.0–100.0)
Platelets: 178 10*3/uL (ref 150–400)
RBC: 5.18 MIL/uL (ref 4.22–5.81)
RDW: 12.9 % (ref 11.5–15.5)
WBC: 5.7 10*3/uL (ref 4.0–10.5)
nRBC: 0 % (ref 0.0–0.2)

## 2019-09-17 LAB — GLUCOSE, CAPILLARY: Glucose-Capillary: 99 mg/dL (ref 70–99)

## 2019-09-17 SURGERY — FUSION, JOINT, FOOT
Anesthesia: General | Site: Foot | Laterality: Right

## 2019-09-17 MED ORDER — CHLORHEXIDINE GLUCONATE 0.12 % MT SOLN
OROMUCOSAL | Status: AC
  Administered 2019-09-17: 15 mL via OROMUCOSAL
  Filled 2019-09-17: qty 15

## 2019-09-17 MED ORDER — 0.9 % SODIUM CHLORIDE (POUR BTL) OPTIME
TOPICAL | Status: DC | PRN
  Administered 2019-09-17: 1000 mL

## 2019-09-17 MED ORDER — PHENYLEPHRINE 40 MCG/ML (10ML) SYRINGE FOR IV PUSH (FOR BLOOD PRESSURE SUPPORT)
PREFILLED_SYRINGE | INTRAVENOUS | Status: DC | PRN
  Administered 2019-09-17: 120 ug via INTRAVENOUS
  Administered 2019-09-17: 80 ug via INTRAVENOUS
  Administered 2019-09-17: 120 ug via INTRAVENOUS

## 2019-09-17 MED ORDER — DEXAMETHASONE SODIUM PHOSPHATE 4 MG/ML IJ SOLN
INTRAMUSCULAR | Status: DC | PRN
  Administered 2019-09-17: 10 mg via INTRAVENOUS

## 2019-09-17 MED ORDER — ACETAMINOPHEN 500 MG PO TABS: 1000.0000 mg | ORAL_TABLET | Freq: Once | ORAL | Status: AC

## 2019-09-17 MED ORDER — CELECOXIB 200 MG PO CAPS: 200.0000 mg | ORAL_CAPSULE | Freq: Once | ORAL | Status: AC

## 2019-09-17 MED ORDER — MIDAZOLAM HCL 2 MG/2ML IJ SOLN
INTRAMUSCULAR | Status: AC
  Administered 2019-09-17: 2 mg via INTRAVENOUS
  Filled 2019-09-17: qty 2

## 2019-09-17 MED ORDER — PROPOFOL 10 MG/ML IV BOLUS
INTRAVENOUS | Status: DC | PRN
  Administered 2019-09-17 (×2): 200 mg via INTRAVENOUS

## 2019-09-17 MED ORDER — ORAL CARE MOUTH RINSE: 15.0000 mL | Freq: Once | OROMUCOSAL | Status: AC

## 2019-09-17 MED ORDER — ONDANSETRON HCL 4 MG/2ML IJ SOLN
INTRAMUSCULAR | Status: DC | PRN
  Administered 2019-09-17: 4 mg via INTRAVENOUS

## 2019-09-17 MED ORDER — PROMETHAZINE HCL 25 MG/ML IJ SOLN: 6.2500 mg | INTRAMUSCULAR | Status: DC | PRN

## 2019-09-17 MED ORDER — LACTATED RINGERS IV SOLN: INTRAVENOUS | Status: DC

## 2019-09-17 MED ORDER — CELECOXIB 200 MG PO CAPS
ORAL_CAPSULE | ORAL | Status: AC
  Administered 2019-09-17: 200 mg via ORAL
  Filled 2019-09-17: qty 1

## 2019-09-17 MED ORDER — FENTANYL CITRATE (PF) 100 MCG/2ML IJ SOLN: 100.0000 ug | INTRAMUSCULAR | Status: AC

## 2019-09-17 MED ORDER — CHLORHEXIDINE GLUCONATE 0.12 % MT SOLN: 15.0000 mL | Freq: Once | OROMUCOSAL | Status: AC

## 2019-09-17 MED ORDER — MIDAZOLAM HCL 2 MG/2ML IJ SOLN: 2.0000 mg | INTRAMUSCULAR | Status: AC

## 2019-09-17 MED ORDER — CLINDAMYCIN PHOSPHATE 900 MG/50ML IV SOLN
INTRAVENOUS | Status: AC
  Filled 2019-09-17: qty 50

## 2019-09-17 MED ORDER — FENTANYL CITRATE (PF) 100 MCG/2ML IJ SOLN
INTRAMUSCULAR | Status: AC
  Administered 2019-09-17: 100 ug via INTRAVENOUS
  Filled 2019-09-17: qty 2

## 2019-09-17 MED ORDER — LIDOCAINE 2% (20 MG/ML) 5 ML SYRINGE
INTRAMUSCULAR | Status: DC | PRN
  Administered 2019-09-17: 100 mg via INTRAVENOUS

## 2019-09-17 MED ORDER — ACETAMINOPHEN 500 MG PO TABS
ORAL_TABLET | ORAL | Status: AC
  Administered 2019-09-17: 1000 mg via ORAL
  Filled 2019-09-17: qty 2

## 2019-09-17 MED ORDER — FENTANYL CITRATE (PF) 100 MCG/2ML IJ SOLN: 25.0000 ug | INTRAMUSCULAR | Status: DC | PRN

## 2019-09-17 MED ORDER — OXYCODONE-ACETAMINOPHEN 5-325 MG PO TABS: 1.0000 | ORAL_TABLET | ORAL | 0 refills | Status: DC | PRN

## 2019-09-17 MED ORDER — CLINDAMYCIN PHOSPHATE 900 MG/50ML IV SOLN
900.0000 mg | INTRAVENOUS | Status: AC
  Administered 2019-09-17: 900 mg via INTRAVENOUS

## 2019-09-17 MED ORDER — FENTANYL CITRATE (PF) 250 MCG/5ML IJ SOLN
INTRAMUSCULAR | Status: DC | PRN
Start: 2019-09-17 — End: 2019-09-17
  Administered 2019-09-17: 100 ug via INTRAVENOUS

## 2019-09-17 SURGICAL SUPPLY — 56 items
BANDAGE ESMARK 6X9 LF (GAUZE/BANDAGES/DRESSINGS) IMPLANT
BIT DRILL 3.0 6IN (DRILL) ×1 IMPLANT
BLADE LONG MED 31X9 (MISCELLANEOUS) ×2 IMPLANT
BLADE SAW SGTL HD 18.5X60.5X1. (BLADE) ×2 IMPLANT
BLADE SURG 10 STRL SS (BLADE) IMPLANT
BNDG CMPR 9X6 STRL LF SNTH (GAUZE/BANDAGES/DRESSINGS)
BNDG COHESIVE 3X5 TAN STRL LF (GAUZE/BANDAGES/DRESSINGS) ×2 IMPLANT
BNDG COHESIVE 4X5 TAN STRL (GAUZE/BANDAGES/DRESSINGS) ×2 IMPLANT
BNDG ESMARK 6X9 LF (GAUZE/BANDAGES/DRESSINGS)
BNDG GAUZE ELAST 4 BULKY (GAUZE/BANDAGES/DRESSINGS) ×2 IMPLANT
COTTON STERILE ROLL (GAUZE/BANDAGES/DRESSINGS) ×2 IMPLANT
COVER MAYO STAND STRL (DRAPES) IMPLANT
COVER SURGICAL LIGHT HANDLE (MISCELLANEOUS) ×4 IMPLANT
COVER WAND RF STERILE (DRAPES) ×2 IMPLANT
DRAPE INCISE IOBAN 66X45 STRL (DRAPES) ×2 IMPLANT
DRAPE OEC MINIVIEW 54X84 (DRAPES) IMPLANT
DRAPE U-SHAPE 47X51 STRL (DRAPES) ×2 IMPLANT
DRILL 3.0 6IN (DRILL) ×2
DRSG ADAPTIC 3X8 NADH LF (GAUZE/BANDAGES/DRESSINGS) ×2 IMPLANT
DURAPREP 26ML APPLICATOR (WOUND CARE) ×2 IMPLANT
ELECT REM PT RETURN 9FT ADLT (ELECTROSURGICAL) ×2
ELECTRODE REM PT RTRN 9FT ADLT (ELECTROSURGICAL) ×1 IMPLANT
GAUZE PETROLATUM 1 X8 (GAUZE/BANDAGES/DRESSINGS) ×2 IMPLANT
GAUZE SPONGE 4X4 12PLY STRL (GAUZE/BANDAGES/DRESSINGS) ×2 IMPLANT
GAUZE SPONGE 4X4 12PLY STRL LF (GAUZE/BANDAGES/DRESSINGS) ×2 IMPLANT
GLOVE BIOGEL PI IND STRL 6.5 (GLOVE) ×1 IMPLANT
GLOVE BIOGEL PI IND STRL 7.0 (GLOVE) ×1 IMPLANT
GLOVE BIOGEL PI IND STRL 7.5 (GLOVE) ×1 IMPLANT
GLOVE BIOGEL PI IND STRL 9 (GLOVE) ×2 IMPLANT
GLOVE BIOGEL PI INDICATOR 6.5 (GLOVE) ×1
GLOVE BIOGEL PI INDICATOR 7.0 (GLOVE) ×1
GLOVE BIOGEL PI INDICATOR 7.5 (GLOVE) ×1
GLOVE BIOGEL PI INDICATOR 9 (GLOVE) ×2
GLOVE SURG ORTHO 9.0 STRL STRW (GLOVE) ×2 IMPLANT
GOWN STRL REUS W/ TWL XL LVL3 (GOWN DISPOSABLE) ×3 IMPLANT
GOWN STRL REUS W/TWL XL LVL3 (GOWN DISPOSABLE) ×6
GUIDEWIRE 1.6 6IN (WIRE) ×10 IMPLANT
KIT BASIN OR (CUSTOM PROCEDURE TRAY) ×2 IMPLANT
KIT TURNOVER KIT B (KITS) ×2 IMPLANT
MANIFOLD NEPTUNE II (INSTRUMENTS) ×2 IMPLANT
NS IRRIG 1000ML POUR BTL (IV SOLUTION) ×2 IMPLANT
PACK ORTHO EXTREMITY (CUSTOM PROCEDURE TRAY) ×2 IMPLANT
PAD ARMBOARD 7.5X6 YLW CONV (MISCELLANEOUS) ×4 IMPLANT
PAD CAST 4YDX4 CTTN HI CHSV (CAST SUPPLIES) ×1 IMPLANT
PADDING CAST COTTON 4X4 STRL (CAST SUPPLIES) ×2
SCREW CANN SHT HDLS 4X42 (Screw) ×2 IMPLANT
SCREW CANN SHT HDLS 4X44 (Screw) ×2 IMPLANT
SCREW CANN SHT HDLS 4X48 (Screw) ×2 IMPLANT
SPONGE LAP 18X18 RF (DISPOSABLE) ×2 IMPLANT
SUCTION FRAZIER HANDLE 10FR (MISCELLANEOUS) ×2
SUCTION TUBE FRAZIER 10FR DISP (MISCELLANEOUS) ×1 IMPLANT
SUT ETHILON 2 0 PSLX (SUTURE) ×10 IMPLANT
TOWEL GREEN STERILE (TOWEL DISPOSABLE) ×2 IMPLANT
TOWEL GREEN STERILE FF (TOWEL DISPOSABLE) ×2 IMPLANT
TUBE CONNECTING 12X1/4 (SUCTIONS) ×2 IMPLANT
WATER STERILE IRR 1000ML POUR (IV SOLUTION) ×2 IMPLANT

## 2019-09-17 NOTE — Anesthesia Postprocedure Evaluation (Signed)
Anesthesia Post Note  Patient: Dylan [name]  Procedure(s) Performed: FUSION RIGHT LISFRANC JOINT (Right Foot)     Patient location during evaluation: PACU Anesthesia Type: General Level of consciousness: sedated Pain management: pain level controlled Vital Signs Assessment: post-procedure vital signs reviewed and stable Respiratory status: spontaneous breathing and respiratory function stable Cardiovascular status: stable Postop Assessment: no apparent nausea or vomiting Anesthetic complications: no   No complications documented.  Last Vitals:  Vitals:   09/17/19 1415 09/17/19 1426  BP:  (!) 130/91  Pulse: 80 75  Resp: 11   Temp:  36.4 C  SpO2: 97% 97%    Last Pain:  Vitals:   09/17/19 1426  TempSrc:   PainSc: 0-No pain                 Anise Harbin DANIEL

## 2019-09-17 NOTE — Op Note (Signed)
09/17/2019  1:42 PM  PATIENT:  Dylan [name]    PRE-OPERATIVE DIAGNOSIS:  Right Lisfranc Dislocation  POST-OPERATIVE DIAGNOSIS:  Same  PROCEDURE:  FUSION RIGHT LISFRANC JOINT including the base of the first metatarsal medial cuneiform C-arm fluoroscopy   SURGEON:  Newt Minion, MD  PHYSICIAN ASSISTANT:None ANESTHESIA:   General  PREOPERATIVE INDICATIONS:  Dylan [name] is a  54 y.o. male with a diagnosis of Right Lisfranc Dislocation who failed conservative measures and elected for surgical management.    The risks benefits and alternatives were discussed with the patient preoperatively including but not limited to the risks of infection, bleeding, nerve injury, cardiopulmonary complications, the need for revision surgery, among others, and the patient was willing to proceed.  OPERATIVE IMPLANTS: [employer] headless compression screws  @ENCIMAGES @  OPERATIVE FINDINGS: C-arm fluoroscopy verified reduction across the Lisfranc complex  OPERATIVE PROCEDURE: Patient was brought the operating room the underwent general anesthetic after a regional block.  After adequate levels anesthesia were obtained patient's left lower extremity was prepped using DuraPrep draped into a sterile field a timeout was called.  A dorsal incision was made over the medial column between the interval of the anterior tibial tendon and the EHL this was carried down to the MTP joint.  The joint was unstable.  Subperiosteal dissection was used to prepare the joint and an oscillating saw was used to resect the bone perpendicular to the long axis of the medial column a curette osteotome and rondure were used to further debride the joint.  The joint was reduced and a K wire was inserted across the joint C-arm fluoroscopy verified alignment and a 42 mm headless compression screw was placed.  C-arm fluoroscopy verified reduction across the joint.  The Lisfranc ligamentous complex was debrided a guidewire was then  inserted from the medial cuneiform through the base of the second metatarsal C-arm fluoroscopy verified alignment and a 44 mm headless compression screw was placed C-arm fluoroscopy verified alignment in AP and lateral planes.  The wound was irrigated with normal saline incision was closed using 2-0 nylon sterile dressing was applied patient was extubated taken the PACU in stable condition   DISCHARGE PLANNING:  Antibiotic duration: Preoperative antibiotics  Weightbearing: Nonweightbearing on the right  Pain medication: Prescription for Percocet  Dressing care/ Wound VAC: Follow-up in 1 week to change the dressing  Ambulatory devices: Crutches  Discharge to: Home.  Follow-up: In the office 1 week post operative.

## 2019-09-17 NOTE — Transfer of Care (Signed)
Immediate Anesthesia Transfer of Care Note  Patient: Dylan [name]  Procedure(s) Performed: FUSION RIGHT LISFRANC JOINT (Right Foot)  Patient Location: PACU  Anesthesia Type:General and GA combined with regional for post-op pain  Level of Consciousness: drowsy and patient cooperative  Airway & Oxygen Therapy: Patient Spontanous Breathing  Post-op Assessment: Report given to RN, Post -op Vital signs reviewed and stable and Patient moving all extremities X 4  Post vital signs: Reviewed and stable  Last Vitals:  Vitals Value Taken Time  BP 129/81 09/17/19 1341  Temp    Pulse 75 09/17/19 1341  Resp 13 09/17/19 1341  SpO2 100 % 09/17/19 1341  Vitals shown include unvalidated device data.  Last Pain:  Vitals:   09/17/19 1209  TempSrc:   PainSc: 0-No pain      Patients Stated Pain Goal: 4 (85/92/76 [id number])  Complications: No complications documented.

## 2019-09-17 NOTE — Anesthesia Preprocedure Evaluation (Addendum)
Anesthesia Evaluation  Patient identified by MRN, date of birth, ID band Patient awake    Reviewed: Allergy & Precautions, NPO status , Patient's Chart, lab work & pertinent test results  Airway Mallampati: II  TM Distance: >3 FB Neck ROM: Full    Dental no notable dental hx. (+) Dental Advisory Given   Pulmonary Current Smoker,    Pulmonary exam normal        Cardiovascular hypertension, Normal cardiovascular exam     Neuro/Psych PSYCHIATRIC DISORDERS Anxiety Depression Bipolar Disorder Schizophrenia negative neurological ROS     GI/Hepatic Neg liver ROS, GERD  ,  Endo/Other  negative endocrine ROS  Renal/GU Renal disease     Musculoskeletal negative musculoskeletal ROS (+)   Abdominal   Peds  Hematology  (+) HIV,   Anesthesia Other Findings   Reproductive/Obstetrics                            Anesthesia Physical Anesthesia Plan  ASA: III  Anesthesia Plan: General   Post-op Pain Management:  Regional for Post-op pain   Induction: Intravenous  PONV Risk Score and Plan: 1 and Ondansetron and Dexamethasone  Airway Management Planned: LMA  Additional Equipment:   Intra-op Plan:   Post-operative Plan: Extubation in OR  Informed Consent: I have reviewed the patients History and Physical, chart, labs and discussed the procedure including the risks, benefits and alternatives for the proposed anesthesia with the patient or authorized representative who has indicated his/her understanding and acceptance.     Dental advisory given  Plan Discussed with: CRNA, Anesthesiologist and Surgeon  Anesthesia Plan Comments:        Anesthesia Quick Evaluation

## 2019-09-17 NOTE — Anesthesia Procedure Notes (Signed)
Procedure Name: LMA Insertion Date/Time: 09/17/2019 12:33 PM Performed by: Darletta Moll, CRNA Pre-anesthesia Checklist: Patient identified, Emergency Drugs available, Suction available and Patient being monitored Patient Re-evaluated:Patient Re-evaluated prior to induction Oxygen Delivery Method: Circle system utilized Preoxygenation: Pre-oxygenation with 100% oxygen Induction Type: IV induction Ventilation: Mask ventilation without difficulty LMA: LMA inserted LMA Size: 5.0 Number of attempts: 1 Placement Confirmation: positive ETCO2,  breath sounds checked- equal and bilateral and CO2 detector Tube secured with: Tape Dental Injury: Teeth and Oropharynx as per pre-operative assessment

## 2019-09-17 NOTE — H&P (Signed)
Dylan [name] is an 53 y.o. male.   Chief Complaint: Right Foot pain HPI: Patient is a 54 year old gentleman who sustained a Lisfranc injury from a fall from a ladder last year.  Patient states that anti-inflammatories have not helped he states his pain is a 10 out of 10 he has undergone good conservative therapy with immobilization without resolution of his symptoms.  Past Medical History:  Diagnosis Date  . Anxiety   . Arthritis   . Bipolar 1 disorder ([place])   . Colon polyps   . Depression   . GERD (gastroesophageal reflux disease)   . Hepatitis B   . Human immunodeficiency virus (HIV) ([place])   . Hyperlipidemia   . Hypertension   . Schizophrenia [facility])     Past Surgical History:  Procedure Laterality Date  . HEMORROIDECTOMY    . IR FLUORO GUIDE CV LINE RIGHT  02/22/2017  . IR US GUIDE VASC ACCESS RIGHT  02/22/2017  . TUMOR REMOVAL     From Chest    Family History  Problem Relation Age of Onset  . CAD Mother   . Diabetes Father   . Colon cancer Maternal Aunt 101  . Diabetes Maternal Aunt   . Heart disease Maternal Uncle   . Esophageal cancer Neg Hx   . Rectal cancer Neg Hx   . Stomach cancer Neg Hx    Social History:  reports that he has been smoking cigarettes. He has a 15.00 pack-year smoking history. He has never used smokeless tobacco. He reports current alcohol use. He reports previous drug use. Drugs: Cocaine and Marijuana.  Allergies:  Allergies  Allergen Reactions  . Penicillins Other (See Comments)    Childhood allergy  . Latex Rash  . Tape Rash    No medications prior to admission.    No results found for this or any previous visit (from the past 48 hour(s)). No results found.  Review of Systems  All other systems reviewed and are negative.   There were no vitals taken for this visit. Physical Exam  Patient is alert, oriented, no adenopathy, well-dressed, normal affect, normal respiratory effort. Examination patient has a strong dorsalis pedis  pulse.  He has pain with distraction across the Lisfranc complex.  Review of the radiographs shows dislocation across the Lisfranc ligament he is point tender to palpation at this location.Heart RRR Lungs Clear Assessment/Plan 1. Lisfranc dislocation, right, initial encounter     Plan: Discussed the importance of smoking cessation.  Discussed that with the persistent pain and dislocation through the Lisfranc complex his best option is to proceed with open reduction internal fixation and fusion across the Lisfranc complex.  Risks and benefits of surgery were discussed including risk of the wound not healing need for additional surgery.  Patient states he understands wished to proceed at this time.   Bevely Palmer Luv Mish, PA 09/17/2019, 6:56 AM

## 2019-09-17 NOTE — Progress Notes (Signed)
Orthopedic Tech Progress Note Patient Details:  Dylan [name] [birth year] [id number] Applied CAM WALKER to patient in PACU Ortho Devices Type of Ortho Device: CAM walker, Crutches Ortho Device/Splint Location: RLE Ortho Device/Splint Interventions: Ordered, Application   Post Interventions Patient Tolerated: Well Instructions Provided: Poper ambulation with device   [name] 09/17/2019, 2:38 PM

## 2019-09-18 ENCOUNTER — Telehealth: Payer: Self-pay | Admitting: Orthopedic Surgery

## 2019-09-18 ENCOUNTER — Encounter: Payer: Self-pay | Admitting: Orthopedic Surgery

## 2019-09-18 NOTE — Telephone Encounter (Signed)
Called patient per mychart message to schedule a post op visit with [facility] or [place]. Patient's mail box is full could not leave message    Sent SMS message for a return call

## 2019-09-19 ENCOUNTER — Encounter (HOSPITAL_COMMUNITY): Payer: Self-pay | Admitting: Orthopedic Surgery

## 2019-09-24 ENCOUNTER — Ambulatory Visit (INDEPENDENT_AMBULATORY_CARE_PROVIDER_SITE_OTHER): Payer: Medicaid Other | Admitting: Family

## 2019-09-24 ENCOUNTER — Encounter: Payer: Self-pay | Admitting: Family

## 2019-09-24 ENCOUNTER — Ambulatory Visit (INDEPENDENT_AMBULATORY_CARE_PROVIDER_SITE_OTHER): Payer: Medicaid Other

## 2019-09-24 VITALS — Ht 74.0 in | Wt 235.0 lb

## 2019-09-24 DIAGNOSIS — S93324A Dislocation of tarsometatarsal joint of right foot, initial encounter: Secondary | ICD-10-CM

## 2019-09-24 MED ORDER — OXYCODONE HCL 5 MG PO TABS: 5.0000 mg | ORAL_TABLET | Freq: Four times a day (QID) | ORAL | 0 refills | Status: DC | PRN

## 2019-09-24 NOTE — Progress Notes (Signed)
Post-Op Visit Note   Patient: Dylan [name]           Date of Birth: 01-23-1965           MRN: [id number] Visit Date: 09/24/2019 PCP: Charlott Rakes, MD  Chief Complaint:  Chief Complaint  Patient presents with  . Right Foot - Routine Post Op    09/17/19 right fusion Lisfranc joint     HPI:  HPI The patient is a 54 year old gentleman who presents status post right foot fusion of his Lisfranc joint 1 week ago.  Surgical dressing removed today.  He has been nonweightbearing with crutches  Ortho Exam Incision well approximated sutures there is scant bloody drainage moderate swelling of the foot and ankle no surrounding erythema warmth or sign of infection  Visit Diagnoses:  1. Lisfranc dislocation, right, initial encounter     Plan:   His fracture boot was broken down and ill fitting provided with a new cam walker today.  He will continue nonweightbearing with his crutches.  He will follow-up in the office in 2 weeks.  Begin daily Dial soap cleansing.  Dry dressing changes.  Follow-Up Instructions: Return in about 2 weeks (around 10/08/2019).   Imaging: XR Foot Complete Right  Result Date: 09/24/2019 Radiographs of the right foot show stable alignment of fixation hardware; Lisfranc fusion.  No complicating features.   Orders:  Orders Placed This Encounter  Procedures  . XR Foot Complete Right   Meds ordered this encounter  Medications  . oxyCODONE (ROXICODONE) 5 MG immediate release tablet    Sig: Take 1 tablet (5 mg total) by mouth every 6 (six) hours as needed for severe pain.    Dispense:  30 tablet    Refill:  0     PMFS History: Patient Active Problem List   Diagnosis Date Noted  . Lisfranc dislocation, right, initial encounter   . Prediabetes 07/22/2019  . Tobacco abuse 07/10/2018  . ESRD needing dialysis ([place]) 03/03/2018  . Cocaine abuse with cocaine-induced mood disorder ([place]) 05/18/2017  . Schizoaffective disorder, bipolar type ([place])   . Acute renal  failure (ARF) ([place])   . Suicidal ideations   . Chronic 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 coma ([place])   . AKI (acute kidney injury) ([place]) 02/19/2017  . Rhabdomyolysis 02/19/2017  . Suicidal overdose ([place]) 02/19/2017  . Acute hepatitis 02/19/2017  . Elevated LFTs   . Homicidal ideations   . Schizoaffective disorder, depressive type ([place]) 09/05/2011  . 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 Degenerative disc disease 08/21/2011  . HIV disease ([place]) 08/17/2011   Past Medical History:  Diagnosis Date  . Anxiety   . Arthritis   . Bipolar 1 disorder ([place])   . Colon polyps   . Depression   . GERD (gastroesophageal reflux disease)   . Hepatitis B   . Human immunodeficiency virus (HIV) ([place])   . Hyperlipidemia   . Hypertension   . Neuromuscular disorder ([facility])    neuropathy  . Neuropathy   . Pre-diabetes   . Schizophrenia ([place])     Family History  Problem Relation Age of Onset  . CAD Mother   . Diabetes Father   . Colon cancer Maternal Aunt 71  . Diabetes Maternal Aunt   . Heart disease Maternal Uncle   . Esophageal cancer Neg Hx   . Rectal cancer Neg Hx   . Stomach cancer Neg Hx     Past Surgical History:  Procedure Laterality Date  . FOOT ARTHRODESIS Right 09/17/2019   Procedure: FUSION RIGHT LISFRANC JOINT;  Surgeon: Newt Minion, MD;  Location: [place];  Service: Orthopedics;  Laterality: Right;  . HEMORROIDECTOMY    . IR FLUORO GUIDE CV LINE RIGHT  02/22/2017  . IR US GUIDE VASC ACCESS RIGHT  02/22/2017  . TUMOR REMOVAL     From Chest   Social History   Occupational History  . Not on file  Tobacco Use  . Smoking status: Current Every Day Smoker    Packs/day: 0.50    Years: 15.00    Pack years: 7.50    Types: Cigarettes  . Smokeless tobacco: Never Used  . Tobacco comment: has patches when he is ready to quit  Vaping Use  . Vaping Use: Never used  Substance and Sexual Activity  . Alcohol use: Yes    Comment:  occassional use on weekends  . Drug use: Not Currently    Types: Cocaine, Marijuana    Comment: last used cocaine and marijuana "years ago"  . Sexual activity: Yes    Partners: Female    Comment: condoms given

## 2019-10-02 ENCOUNTER — Encounter (INDEPENDENT_AMBULATORY_CARE_PROVIDER_SITE_OTHER): Payer: Self-pay

## 2019-10-08 ENCOUNTER — Encounter: Payer: Self-pay | Admitting: Radiology

## 2019-10-08 ENCOUNTER — Ambulatory Visit (INDEPENDENT_AMBULATORY_CARE_PROVIDER_SITE_OTHER): Payer: Medicaid Other

## 2019-10-08 ENCOUNTER — Ambulatory Visit (INDEPENDENT_AMBULATORY_CARE_PROVIDER_SITE_OTHER): Payer: Self-pay | Admitting: Family

## 2019-10-08 ENCOUNTER — Encounter: Payer: Self-pay | Admitting: Family

## 2019-10-08 VITALS — Ht 74.0 in | Wt 235.0 lb

## 2019-10-08 DIAGNOSIS — M79671 Pain in right foot: Secondary | ICD-10-CM

## 2019-10-08 NOTE — Progress Notes (Signed)
Post-Op Visit Note   Patient: Dylan [name]           Date of Birth: 1965/11/15           MRN: [id number] Visit Date: 10/08/2019 PCP: Charlott Rakes, MD  Chief Complaint:  Chief Complaint  Patient presents with  . Right Foot - Routine Post Op, Wound Check    HPI:  HPI The patient is a 54 year old gentleman seen 3 weeks status post right lisfranc dislocation repair.  The patient has been full weightbearing in a Cam walker he does carry crutches well he ambulates.  He remains out of work.  He reports mild pain that worsens over the course of the day some mild swelling.  Some sensitivity in his toes.  Ortho Exam Incision well-healed.  Sutures harvested today without incident there is no erythema warmth no gaping no drainage  Visit Diagnoses:  1. Pain in right foot     Plan: Advance weightbearing as tolerated in cam walker he will follow-up once more in 3 weeks.  Anticipate releasing him to regular shoewear at that time  Follow-Up Instructions: Return in about 19 days (around 10/27/2019).   Imaging: No results found.  Orders:  Orders Placed This Encounter  Procedures  . XR Foot Complete Right   No orders of the defined types were placed in this encounter.    PMFS History: Patient Active Problem List   Diagnosis Date Noted  . Lisfranc dislocation, right, initial encounter   . Prediabetes 07/22/2019  . Tobacco abuse 07/10/2018  . ESRD needing dialysis ([place]) 03/03/2018  . Cocaine abuse with cocaine-induced mood disorder ([place]) 05/18/2017  . Schizoaffective disorder, bipolar type ([place])   . Acute renal failure (ARF) ([place])   . Suicidal ideations   . Chronic 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 coma ([place])   . AKI (acute kidney injury) ([place]) 02/19/2017  . Rhabdomyolysis 02/19/2017  . Suicidal overdose ([place]) 02/19/2017  . Acute hepatitis 02/19/2017  . Elevated LFTs   . Homicidal ideations   . Schizoaffective disorder, depressive type ([place]) 09/05/2011  .  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 Degenerative disc disease 08/21/2011  . HIV disease ([place]) 08/17/2011   Past Medical History:  Diagnosis Date  . Anxiety   . Arthritis   . Bipolar 1 disorder ([place])   . Colon polyps   . Depression   . GERD (gastroesophageal reflux disease)   . Hepatitis B   . Human immunodeficiency virus (HIV) ([place])   . Hyperlipidemia   . Hypertension   . Neuromuscular disorder ([facility])    neuropathy  . Neuropathy   . Pre-diabetes   . Schizophrenia ([place])     Family History  Problem Relation Age of Onset  . CAD Mother   . Diabetes Father   . Colon cancer Maternal Aunt 80  . Diabetes Maternal Aunt   . Heart disease Maternal Uncle   . Esophageal cancer Neg Hx   . Rectal cancer Neg Hx   . Stomach cancer Neg Hx     Past Surgical History:  Procedure Laterality Date  . FOOT ARTHRODESIS Right 09/17/2019   Procedure: FUSION RIGHT LISFRANC JOINT;  Surgeon: Newt Minion, MD;  Location: [place];  Service: Orthopedics;  Laterality: Right;  . HEMORROIDECTOMY    . IR FLUORO GUIDE CV LINE RIGHT  02/22/2017  . IR US GUIDE VASC ACCESS RIGHT  02/22/2017  . TUMOR REMOVAL     From Chest  Social History   Occupational History  . Not on file  Tobacco Use  . Smoking status: Current Every Day Smoker    Packs/day: 0.50    Years: 15.00    Pack years: 7.50    Types: Cigarettes  . Smokeless tobacco: Never Used  . Tobacco comment: has patches when he is ready to quit  Vaping Use  . Vaping Use: Never used  Substance and Sexual Activity  . Alcohol use: Yes    Comment: occassional use on weekends  . Drug use: Not Currently    Types: Cocaine, Marijuana    Comment: last used cocaine and marijuana "years ago"  . Sexual activity: Yes    Partners: Female    Comment: condoms given

## 2019-10-13 ENCOUNTER — Other Ambulatory Visit: Payer: Self-pay

## 2019-10-13 DIAGNOSIS — Z113 Encounter for screening for infections with a predominantly sexual mode of transmission: Secondary | ICD-10-CM

## 2019-10-13 DIAGNOSIS — B20 Human immunodeficiency virus [HIV] disease: Secondary | ICD-10-CM

## 2019-10-15 ENCOUNTER — Other Ambulatory Visit: Payer: Medicaid Other

## 2019-10-15 ENCOUNTER — Other Ambulatory Visit: Payer: Self-pay

## 2019-10-15 DIAGNOSIS — B20 Human immunodeficiency virus [HIV] disease: Secondary | ICD-10-CM

## 2019-10-15 DIAGNOSIS — Z113 Encounter for screening for infections with a predominantly sexual mode of transmission: Secondary | ICD-10-CM

## 2019-10-16 LAB — T-HELPER CELL (CD4) - (RCID CLINIC ONLY)
CD4 % Helper T Cell: 35 % (ref 33–65)
CD4 T Cell Abs: 1126 /uL (ref 400–1790)

## 2019-10-18 LAB — HIV-1 RNA QUANT-NO REFLEX-BLD
HIV 1 RNA Quant: <20 Copies/mL — ABNORMAL HIGH
HIV-1 RNA Quant, Log: <1.3 Log cps/mL — ABNORMAL HIGH

## 2019-10-18 LAB — RPR: RPR Ser Ql: NONREACTIVE

## 2019-10-22 ENCOUNTER — Telehealth: Payer: Medicaid Other | Admitting: Family Medicine

## 2019-10-22 ENCOUNTER — Encounter: Payer: Self-pay | Admitting: Family

## 2019-10-22 ENCOUNTER — Ambulatory Visit: Payer: Self-pay

## 2019-10-22 ENCOUNTER — Ambulatory Visit (INDEPENDENT_AMBULATORY_CARE_PROVIDER_SITE_OTHER): Payer: Medicaid Other | Admitting: Family

## 2019-10-22 VITALS — Ht 74.0 in | Wt 235.0 lb

## 2019-10-22 DIAGNOSIS — S93324D Dislocation of tarsometatarsal joint of right foot, subsequent encounter: Secondary | ICD-10-CM

## 2019-10-22 MED ORDER — OXYCODONE HCL 5 MG PO TABS
5.0000 mg | ORAL_TABLET | Freq: Three times a day (TID) | ORAL | 0 refills | Status: DC | PRN
Start: 2019-10-22 — End: 2019-11-10

## 2019-10-22 NOTE — Progress Notes (Signed)
Office Visit Note   Patient: Dylan [name]           Date of Birth: May 23, 1965           MRN: [id number] Visit Date: 10/22/2019              Requested by: Charlott Rakes, MD [place],  [place] [id number] PCP: Charlott Rakes, MD  Chief Complaint  Patient presents with  . Right Foot - Follow-up      HPI: The patient is a 54 year old gentleman seen status post Lisfranc dislocation repair of his right foot he complains of occasional pain pain shooting out of his great toe some dorsal foot pain especially with prolonged weightbearing.  He has been in his cam walker full weightbearing no issues in the cam walker has tried walking without the boot states he has pain.  He is wondering when he will be able to get his MMI  Assessment & Plan: Visit Diagnoses:  1. Dislocation of tarsometatarsal joint of right foot, subsequent encounter     Plan: Continue with Cam walker may weight-bear in the cam as tolerated he will follow up once more in 2 to 3 weeks anticipate advancing to regular shoewear at that time discussed that he may be most comfortable stiff shoe wear for several weeks discussed that we will give him his MMI at 3 months postop at the earliest.  Follow-Up Instructions: Return in about 2 weeks (around 11/05/2019).   Ortho Exam  Patient is alert, oriented, no adenopathy, well-dressed, normal affect, normal respiratory effort. On examination of the right foot incision is well-healed there is mild swelling no erythema no gaping no sign of infection  Imaging: No results found. No images are attached to the encounter.  Labs: Lab Results  Component Value Date   HGBA1C 6.0 (H) 07/11/2019   HGBA1C 6.1 (H) 02/13/2019   HGBA1C 5.7 (H) 06/20/2018   REPTSTATUS 03/03/2017 FINAL 02/26/2017   CULT  02/26/2017    NO GROWTH 5 DAYS Performed at [facility], [place] [address], [place],  [address]      Lab Results  Component Value Date   ALBUMIN 4.0  09/17/2019   ALBUMIN 4.5 07/11/2019   ALBUMIN 4.6 02/13/2019    Lab Results  Component Value Date   MG 2.8 (H) 02/20/2017   No results found for: VD25OH  No results found for: PREALBUMIN CBC EXTENDED Latest Ref Rng & Units 09/17/2019 07/11/2019 09/11/2018  WBC 4.0 - 10.5 K/uL 5.7 10.6(H) 8.3  RBC 4.22 - 5.81 MIL/uL 5.18 4.83 4.75  HGB 13.0 - 17.0 g/dL 16.1 14.9 15.3  HCT 39 - 52 % 48.7 44.7 44.8  PLT 150 - 400 K/uL 178 180 184  NEUTROABS 1.7 - 7.7 K/uL - 8.2(H) 3,868  LYMPHSABS 0.7 - 4.0 K/uL - 1.8 3,453     Body mass index is 30.17 kg/m.  Orders:  Orders Placed This Encounter  Procedures  . XR Foot Complete Right   Meds ordered this encounter  Medications  . oxyCODONE (ROXICODONE) 5 MG immediate release tablet    Sig: Take 1 tablet (5 mg total) by mouth 3 (three) times daily as needed for severe pain.    Dispense:  30 tablet    Refill:  0     Procedures: No procedures performed  Clinical Data: No additional findings.  ROS:  All other systems negative, except as noted in the HPI. Review of Systems  Constitutional: Negative for chills and  fever.  Musculoskeletal: Positive for arthralgias.  Skin: Negative for wound.    Objective: Vital Signs: Ht 6\' 2"  (1.88 m)   Wt 235 lb (106.6 kg)   BMI 30.17 kg/m   Specialty Comments:  No specialty comments available.  PMFS History: Patient Active Problem List   Diagnosis Date Noted  . Lisfranc dislocation, right, initial encounter   . Prediabetes 07/22/2019  . Tobacco abuse 07/10/2018  . ESRD needing dialysis ([place]) 03/03/2018  . Cocaine abuse with cocaine-induced mood disorder ([place]) 05/18/2017  . Schizoaffective disorder, bipolar type ([place])   . Acute renal failure (ARF) ([place])   . Suicidal ideations   . Chronic 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 coma ([place])   . AKI (acute kidney injury) ([place]) 02/19/2017  . Rhabdomyolysis 02/19/2017  . Suicidal overdose ([place]) 02/19/2017  . Acute hepatitis  02/19/2017  . Elevated LFTs   . Homicidal ideations   . Schizoaffective disorder, depressive type ([place]) 09/05/2011  . 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 Degenerative disc disease 08/21/2011  . HIV disease ([place]) 08/17/2011   Past Medical History:  Diagnosis Date  . Anxiety   . Arthritis   . Bipolar 1 disorder ([facility])   . Colon polyps   . Depression   . GERD (gastroesophageal reflux disease)   . Hepatitis B   . Human immunodeficiency virus (HIV) ([place])   . Hyperlipidemia   . Hypertension   . Neuromuscular disorder ([facility])    neuropathy  . Neuropathy   . Pre-diabetes   . Schizophrenia ([place])     Family History  Problem Relation Age of Onset  . CAD Mother   . Diabetes Father   . Colon cancer Maternal Aunt 57  . Diabetes Maternal Aunt   . Heart disease Maternal Uncle   . Esophageal cancer Neg Hx   . Rectal cancer Neg Hx   . Stomach cancer Neg Hx     Past Surgical History:  Procedure Laterality Date  . FOOT ARTHRODESIS Right 09/17/2019   Procedure: FUSION RIGHT LISFRANC JOINT;  Surgeon: Newt Minion, MD;  Location: [facility];  Service: Orthopedics;  Laterality: Right;  . HEMORROIDECTOMY    . IR FLUORO GUIDE CV LINE RIGHT  02/22/2017  . IR US GUIDE VASC ACCESS RIGHT  02/22/2017  . TUMOR REMOVAL     From Chest   Social History   Occupational History  . Not on file  Tobacco Use  . Smoking status: Current Every Day Smoker    Packs/day: 0.50    Years: 15.00    Pack years: 7.50    Types: Cigarettes  . Smokeless tobacco: Never Used  . Tobacco comment: has patches when he is ready to quit  Vaping Use  . Vaping Use: Never used  Substance and Sexual Activity  . Alcohol use: Yes    Comment: occassional use on weekends  . Drug use: Not Currently    Types: Cocaine, Marijuana    Comment: last used cocaine and marijuana "years ago"  . Sexual activity: Yes    Partners: Female    Comment: condoms given

## 2019-10-23 ENCOUNTER — Telehealth: Payer: Self-pay

## 2019-10-23 NOTE — Telephone Encounter (Signed)
Prior auth through [facility] portal obtained for pt's oxycodone 5 mg rx today,.

## 2019-10-29 ENCOUNTER — Encounter: Payer: Medicaid Other | Admitting: Internal Medicine

## 2019-11-03 ENCOUNTER — Ambulatory Visit: Payer: Medicaid Other | Admitting: Physician Assistant

## 2019-11-09 ENCOUNTER — Encounter (HOSPITAL_COMMUNITY): Payer: Self-pay | Admitting: Emergency Medicine

## 2019-11-09 ENCOUNTER — Ambulatory Visit (HOSPITAL_COMMUNITY)
Admission: EM | Admit: 2019-11-09 | Discharge: 2019-11-10 | Disposition: A | Discharge status: Home / Self Care | Payer: Medicaid Other | Attending: Family | Admitting: Family

## 2019-11-09 ENCOUNTER — Other Ambulatory Visit: Payer: Self-pay

## 2019-11-09 DIAGNOSIS — F251 Schizoaffective disorder, depressive type: Secondary | ICD-10-CM | POA: Diagnosis not present

## 2019-11-09 DIAGNOSIS — F1414 Cocaine abuse with cocaine-induced mood disorder: Secondary | ICD-10-CM | POA: Diagnosis not present

## 2019-11-09 DIAGNOSIS — Z20822 Contact with and (suspected) exposure to covid-19: Secondary | ICD-10-CM | POA: Diagnosis not present

## 2019-11-09 LAB — COMPREHENSIVE METABOLIC PANEL
ALT: 53 U/L — ABNORMAL HIGH (ref 0–44)
AST: 91 U/L — ABNORMAL HIGH (ref 15–41)
Albumin: 4.5 g/dL (ref 3.5–5.0)
Alkaline Phosphatase: 91 U/L (ref 38–126)
Anion gap: 10 (ref 5–15)
BUN: 21 mg/dL — ABNORMAL HIGH (ref 6–20)
CO2: 23 mmol/L (ref 22–32)
Calcium: 9.9 mg/dL (ref 8.9–10.3)
Chloride: 103 mmol/L (ref 98–111)
Creatinine, Ser: 1.09 mg/dL (ref 0.61–1.24)
GFR, Estimated: >60 mL/min (ref >60)
Glucose, Bld: 101 mg/dL — ABNORMAL HIGH (ref 70–99)
Potassium: 3.9 mmol/L (ref 3.5–5.1)
Sodium: 136 mmol/L (ref 135–145)
Total Bilirubin: 0.8 mg/dL (ref 0.3–1.2)
Total Protein: 7.8 g/dL (ref 6.5–8.1)

## 2019-11-09 LAB — CBC WITH DIFFERENTIAL/PLATELET
Abs Immature Granulocytes: 0.03 10*3/uL (ref 0.00–0.07)
Basophils Absolute: 0 10*3/uL (ref 0.0–0.1)
Basophils Relative: 0 %
Eosinophils Absolute: 0.1 10*3/uL (ref 0.0–0.5)
Eosinophils Relative: 1 %
HCT: 43.5 % (ref 39.0–52.0)
Hemoglobin: 14.7 g/dL (ref 13.0–17.0)
Immature Granulocytes: 0 %
Lymphocytes Relative: 22 %
Lymphs Abs: 2.3 10*3/uL (ref 0.7–4.0)
MCH: 31.5 pg (ref 26.0–34.0)
MCHC: 33.8 g/dL (ref 30.0–36.0)
MCV: 93.1 fL (ref 80.0–100.0)
Monocytes Absolute: 1.1 10*3/uL — ABNORMAL HIGH (ref 0.1–1.0)
Monocytes Relative: 11 %
Neutro Abs: 7.1 10*3/uL (ref 1.7–7.7)
Neutrophils Relative %: 66 %
Platelets: 202 10*3/uL (ref 150–400)
RBC: 4.67 MIL/uL (ref 4.22–5.81)
RDW: 13.4 % (ref 11.5–15.5)
WBC: 10.7 10*3/uL — ABNORMAL HIGH (ref 4.0–10.5)
nRBC: 0 % (ref 0.0–0.2)

## 2019-11-09 LAB — HEMOGLOBIN A1C
Hgb A1c MFr Bld: 5.9 % — ABNORMAL HIGH (ref 4.8–5.6)
Mean Plasma Glucose: 122.63 mg/dL

## 2019-11-09 LAB — LIPID PANEL
Cholesterol: 211 mg/dL — ABNORMAL HIGH (ref 0–200)
HDL: 47 mg/dL (ref >40)
LDL Cholesterol: 146 mg/dL — ABNORMAL HIGH (ref 0–99)
Total CHOL/HDL Ratio: 4.5 RATIO
Triglycerides: 92 mg/dL (ref <150)
VLDL: 18 mg/dL (ref 0–40)

## 2019-11-09 LAB — ETHANOL: Alcohol, Ethyl (B): <10 mg/dL (ref <10)

## 2019-11-09 LAB — RESPIRATORY PANEL BY RT PCR (FLU A&B, COVID)
Influenza A by PCR: NEGATIVE
Influenza B by PCR: NEGATIVE
SARS Coronavirus 2 by RT PCR: NEGATIVE

## 2019-11-09 LAB — TSH: TSH: 0.931 u[IU]/mL (ref 0.350–4.500)

## 2019-11-09 LAB — POC SARS CORONAVIRUS 2 AG: SARS Coronavirus 2 Ag: NEGATIVE

## 2019-11-09 MED ORDER — QUETIAPINE FUMARATE 50 MG PO TABS
50.0000 mg | ORAL_TABLET | Freq: Two times a day (BID) | ORAL | Status: DC
  Administered 2019-11-09 – 2019-11-10 (×3): 50 mg via ORAL
  Filled 2019-11-09 (×4): qty 1

## 2019-11-09 MED ORDER — MAGNESIUM HYDROXIDE 400 MG/5ML PO SUSP: 30.0000 mL | Freq: Every day | ORAL | Status: DC | PRN

## 2019-11-09 MED ORDER — TRAZODONE HCL 50 MG PO TABS
50.0000 mg | ORAL_TABLET | Freq: Every evening | ORAL | Status: DC | PRN
  Administered 2019-11-09: 50 mg via ORAL
  Filled 2019-11-09: qty 1

## 2019-11-09 MED ORDER — TRAZODONE HCL 50 MG PO TABS: 50.0000 mg | ORAL_TABLET | Freq: Every evening | ORAL | Status: DC | PRN

## 2019-11-09 MED ORDER — HYDROXYZINE HCL 25 MG PO TABS
25.0000 mg | ORAL_TABLET | ORAL | Status: DC | PRN
  Administered 2019-11-09: 25 mg via ORAL
  Filled 2019-11-09: qty 1

## 2019-11-09 MED ORDER — ALUM & MAG HYDROXIDE-SIMETH 200-200-20 MG/5ML PO SUSP: 30.0000 mL | ORAL | Status: DC | PRN

## 2019-11-09 MED ORDER — ACETAMINOPHEN 325 MG PO TABS
650.0000 mg | ORAL_TABLET | Freq: Four times a day (QID) | ORAL | Status: DC | PRN
  Administered 2019-11-09 (×2): 650 mg via ORAL
  Filled 2019-11-09 (×2): qty 2

## 2019-11-09 MED ORDER — BICTEGRAVIR-EMTRICITAB-TENOFOV 50-200-25 MG PO TABS
1.0000 | ORAL_TABLET | Freq: Once | ORAL | Status: AC
  Administered 2019-11-09: 1 via ORAL
  Filled 2019-11-09: qty 1

## 2019-11-09 MED ORDER — GABAPENTIN 600 MG PO TABS
300.0000 mg | ORAL_TABLET | Freq: Two times a day (BID) | ORAL | Status: DC
  Administered 2019-11-09 – 2019-11-10 (×3): 300 mg via ORAL
  Filled 2019-11-09 (×4): qty 1

## 2019-11-09 NOTE — ED Notes (Signed)
[facility] called for STAT lab pickup

## 2019-11-09 NOTE — ED Provider Notes (Addendum)
Behavioral Health Admission H&P [facility] & OBS)  Date: 11/09/19 Patient Name: Dylan [name] MRN: [id number] Chief Complaint:   Evaluation:  Dylan [name] presented to behavioral health [place] as a walk-in accompanied by [employer].  Reports bizarre behavior, auditory and visual hallucinations.  Patient observed sitting in assessment room responding to internal stimuli.  Denying suicidal or homicidal ideations during this assessment. Dylan [name] reports that he needs help for substance abuse.  Was reported for TTS assessment patient was having suicidal ideation with plan and intent.  Patient is disorganized,tangential and paranoid but redirectable. Reported he has been off his medications and is unable to recall the name of medicaitons.   Per admission assessment note: Patient was brought to the [facility] by the police.  When asked why he is here, patient states, "I have skin mites trying to draw blood out of my veins and I feel like blowing my brains out."  Patient states that he has three prior suicide attempts and his last was "earlier" this year.  Patient states that he attempted to overdose.  Patient states that he has been admitted to [facility] in the past, but cannot identify when it was.  Patient states that he was being seen at [facility] in the past, but states that he has not been seen there nor has he been on medications in "awhile."  Patient states that he has been diagnosed with schizoaffective disorder, depression and anxiety.  Patient denies HI, but states that he hears voices that are trying to fornicate with his body and states that he feels like he is being raped.  He states that he sees people climbing the stairs that are not there.  Patient states that he has been abusing alcohol and cocaine "occasionally."  He states that he first started using cocaine in his thirties and states that he last used cocaine by snorting a couple lines 3 days ago.  Patient states that he drinks "occasionally" and states that he started drinking  at age 63 and states that he drank a beer last night.  Chief Complaint  Patient presents with  . Suicidal  . Hallucinations     Diagnoses:  Final diagnoses:  None    HPI:   PHQ 2-9:    ED from 11/09/2019 in [facility] Office Visit from 07/22/2019 in [place] Office Visit from 09/03/2018 in [place]  Thoughts that you would be better off dead, or of hurting yourself in some way More than half the days Not at all Not at all  PHQ-9 Total Score 19 15 15         Total Time spent with patient: 15 minutes  Musculoskeletal  Strength & Muscle Tone: within normal limits Gait & Station: unsteady Patient leans: N/A  Psychiatric Specialty Exam  Presentation General Appearance: Bizarre;Appropriate for Environment  Eye Contact:Minimal  Speech:Clear and Coherent  Speech Volume:Normal  Handedness:Right   Mood and Affect  Mood:Anxious;Depressed;Dysphoric  Affect:Congruent   Thought Process  Thought Processes:Disorganized  Descriptions of Associations:Loose  Orientation:Full (Time, Place and Person)  Thought Content:Illogical;Tangential;Paranoid Ideation  Hallucinations:Hallucinations: Auditory  Ideas of Reference:Paranoia  Suicidal Thoughts:Suicidal Thoughts: No  Homicidal Thoughts:Homicidal Thoughts: No   Sensorium  Memory:Immediate Poor;Recent Poor;Remote Poor  Judgment:Fair  Insight:Fair   Executive Functions  Concentration:Fair  Attention Span:Fair  [place]   Psychomotor Activity  Psychomotor Activity:Psychomotor Activity: Extrapyramidal Side Effects (EPS) Extrapyramidal Side Effects (EPS): Other (comment) AIMS Completed?: No  Assets  Assets:Communication Skills;Financial Resources/Insurance   Sleep  Sleep:Sleep: Fair   Physical Exam Vitals reviewed.  Psychiatric:        Attention and  Perception: He perceives auditory hallucinations.        Mood and Affect: Mood is anxious.        Behavior: Behavior is agitated.        Thought Content: Thought content is paranoid. Thought content includes suicidal ideation. Thought content includes suicidal plan.        Cognition and Memory: Memory is impaired.    ROS  Blood pressure 133/78, pulse 91, temperature 97.7 F (36.5 C), temperature source Tympanic, resp. rate 18, height 6\' 2"  (1.88 m), weight 230 lb (104.3 kg), SpO2 98 %. Body mass index is 29.53 kg/m.  Past Psychiatric History:   Is the patient at risk to self? No  Has the patient been a risk to self in the past 6 months? No .    Has the patient been a risk to self within the distant past? No   Is the patient a risk to others? No   Has the patient been a risk to others in the past 6 months? No   Has the patient been a risk to others within the distant past? No   Past Medical History:  Past Medical History:  Diagnosis Date  . Anxiety   . Arthritis   . Bipolar 1 disorder ([place])   . Colon polyps   . Depression   . GERD (gastroesophageal reflux disease)   . Hepatitis B   . Human immunodeficiency virus (HIV) ([place])   . Hyperlipidemia   . Hypertension   . Neuromuscular disorder ([facility])    neuropathy  . Neuropathy   . Pre-diabetes   . Schizophrenia [facility])     Past Surgical History:  Procedure Laterality Date  . FOOT ARTHRODESIS Right 09/17/2019   Procedure: FUSION RIGHT LISFRANC JOINT;  Surgeon: Newt Minion, MD;  Location: [place];  Service: Orthopedics;  Laterality: Right;  . HEMORROIDECTOMY    . IR FLUORO GUIDE CV LINE RIGHT  02/22/2017  . IR US GUIDE VASC ACCESS RIGHT  02/22/2017  . TUMOR REMOVAL     From Chest    Family History:  Family History  Problem Relation Age of Onset  . CAD Mother   . Diabetes Father   . Colon cancer Maternal Aunt 23  . Diabetes Maternal Aunt   . Heart disease Maternal Uncle   . Esophageal cancer Neg Hx   . Rectal cancer Neg Hx    . Stomach cancer Neg Hx     Social History:  Social History   Socioeconomic History  . Marital status: Married    Spouse name: Not on file  . Number of children: 1  . Years of education: Not on file  . Highest education level: Not on file  Occupational History  . Not on file  Tobacco Use  . Smoking status: Current Every Day Smoker    Packs/day: 0.50    Years: 15.00    Pack years: 7.50    Types: Cigarettes  . Smokeless tobacco: Never Used  . Tobacco comment: has patches when he is ready to quit  Vaping Use  . Vaping Use: Never used  Substance and Sexual Activity  . Alcohol use: Yes    Comment: occassional use on weekends  . Drug use: Not Currently    Types: Cocaine, Marijuana    Comment: last used cocaine and marijuana "  years ago"  . Sexual activity: Yes    Partners: Female    Comment: condoms given  Other Topics Concern  . Not on file  Social History Narrative  . Not on file   Social Determinants of Health   Financial Resource Strain:   . Difficulty of Paying Living Expenses: Not on file  Food Insecurity:   . Worried About Charity fundraiser in the Last Year: Not on file  . Ran Out of Food in the Last Year: Not on file  Transportation Needs:   . Lack of Transportation (Medical): Not on file  . Lack of Transportation (Non-Medical): Not on file  Physical Activity:   . Days of Exercise per Week: Not on file  . Minutes of Exercise per Session: Not on file  Stress:   . Feeling of Stress : Not on file  Social Connections:   . Frequency of Communication with Friends and Family: Not on file  . Frequency of Social Gatherings with Friends and Family: Not on file  . Attends Religious Services: Not on file  . Active Member of Clubs or Organizations: Not on file  . Attends Archivist Meetings: Not on file  . Marital Status: Not on file  Intimate Partner Violence:   . Fear of Current or Ex-Partner: Not on file  . Emotionally Abused: Not on file  .  Physically Abused: Not on file  . Sexually Abused: Not on file    SDOH:  SDOH Screenings   Alcohol Screen:   . Last Alcohol Screening Score (AUDIT): Not on file  Depression (PHQ2-9): Medium Risk  . PHQ-2 Score: 19  Financial Resource Strain:   . Difficulty of Paying Living Expenses: Not on file  Food Insecurity:   . Worried About Charity fundraiser in the Last Year: Not on file  . Ran Out of Food in the Last Year: Not on file  Housing:   . Last Housing Risk Score: Not on file  Physical Activity:   . Days of Exercise per Week: Not on file  . Minutes of Exercise per Session: Not on file  Social Connections:   . Frequency of Communication with Friends and Family: Not on file  . Frequency of Social Gatherings with Friends and Family: Not on file  . Attends Religious Services: Not on file  . Active Member of Clubs or Organizations: Not on file  . Attends Archivist Meetings: Not on file  . Marital Status: Not on file  Stress:   . Feeling of Stress : Not on file  Tobacco Use: High Risk  . Smoking Tobacco Use: Current Every Day Smoker  . Smokeless Tobacco Use: Never Used  Transportation Needs:   . Film/video editor (Medical): Not on file  . Lack of Transportation (Non-Medical): Not on file    Last Labs:  Admission on 11/09/2019  Component Date Value Ref Range Status  . SARS Coronavirus 2 by RT PCR 11/09/2019 NEGATIVE  NEGATIVE Final   Comment: (NOTE) SARS-CoV-2 target nucleic acids are NOT DETECTED.  The SARS-CoV-2 RNA is generally detectable in upper respiratoy specimens during the acute phase of infection. The lowest concentration of SARS-CoV-2 viral copies this assay can detect is 131 copies/mL. A negative result does not preclude SARS-Cov-2 infection and should not be used as the sole basis for treatment or other patient management decisions. A negative result may occur with  improper specimen collection/handling, submission of specimen other than  nasopharyngeal swab, presence of  viral mutation(s) within the areas targeted by this assay, and inadequate number of viral copies (<131 copies/mL). A negative result must be combined with clinical observations, patient history, and epidemiological information. The expected result is Negative.  Fact Sheet for Patients:  PinkCheek.be  Fact Sheet for Healthcare Providers:  GravelBags.it  This test is no                          t yet approved or cleared by the [place] FDA and  has been authorized for detection and/or diagnosis of SARS-CoV-2 by FDA under an Emergency Use Authorization (EUA). This EUA will remain  in effect (meaning this test can be used) for the duration of the COVID-19 declaration under Section 564(b)(1) of the Act, 21 U.S.C. section 360bbb-3(b)(1), unless the authorization is terminated or revoked sooner.    . Influenza A by PCR 11/09/2019 NEGATIVE  NEGATIVE Final  . Influenza B by PCR 11/09/2019 NEGATIVE  NEGATIVE Final   Comment: (NOTE) The Xpert Xpress SARS-CoV-2/FLU/RSV assay is intended as an aid in  the diagnosis of influenza from Nasopharyngeal swab specimens and  should not be used as a sole basis for treatment. Nasal washings and  aspirates are unacceptable for Xpert Xpress SARS-CoV-2/FLU/RSV  testing.  Fact Sheet for Patients: PinkCheek.be  Fact Sheet for Healthcare Providers: GravelBags.it  This test is not yet approved or cleared by the [place] FDA and  has been authorized for detection and/or diagnosis of SARS-CoV-2 by  FDA under an Emergency Use Authorization (EUA). This EUA will remain  in effect (meaning this test can be used) for the duration of the  Covid-19 declaration under Section 564(b)(1) of the Act, 21  U.S.C. section 360bbb-3(b)(1), unless the authorization is  terminated or revoked. Performed at [facility], [place] [address], [place], [place] [address]   . WBC 11/09/2019 10.7* 4.0 - 10.5 K/uL Final  . RBC 11/09/2019 4.67  4.22 - 5.81 MIL/uL Final  . Hemoglobin 11/09/2019 14.7  13.0 - 17.0 g/dL Final  . HCT 11/09/2019 43.5  39 - 52 % Final  . MCV 11/09/2019 93.1  80.0 - 100.0 fL Final  . MCH 11/09/2019 31.5  26.0 - 34.0 pg Final  . MCHC 11/09/2019 33.8  30.0 - 36.0 g/dL Final  . RDW 11/09/2019 13.4  11.5 - 15.5 % Final  . Platelets 11/09/2019 202  150 - 400 K/uL Final  . nRBC 11/09/2019 0.0  0.0 - 0.2 % Final  . Neutrophils Relative % 11/09/2019 66  % Final  . Neutro Abs 11/09/2019 7.1  1.7 - 7.7 K/uL Final  . Lymphocytes Relative 11/09/2019 22  % Final  . Lymphs Abs 11/09/2019 2.3  0.7 - 4.0 K/uL Final  . Monocytes Relative 11/09/2019 11  % Final  . Monocytes Absolute 11/09/2019 1.1* 0.1 - 1.0 K/uL Final  . Eosinophils Relative 11/09/2019 1  % Final  . Eosinophils Absolute 11/09/2019 0.1  0.0 - 0.5 K/uL Final  . Basophils Relative 11/09/2019 0  % Final  . Basophils Absolute 11/09/2019 0.0  0.0 - 0.1 K/uL Final  . Immature Granulocytes 11/09/2019 0  % Final  . Abs Immature Granulocytes 11/09/2019 0.03  0.00 - 0.07 K/uL Final   Performed at [facility], [place] [address], [place], [place] [address]  . Hgb A1c MFr Bld 11/09/2019 5.9* 4.8 - 5.6 % Final   Comment: (NOTE) Pre diabetes:  5.7%-6.4%  Diabetes:              >6.4%  Glycemic control for   <7.0% adults with diabetes   . Mean Plasma Glucose 11/09/2019 122.63  mg/dL Final   Performed at [facility] [address] [place], [place] [address]  . Sodium 11/09/2019 136  135 - 145 mmol/L Final  . Potassium 11/09/2019 3.9  3.5 - 5.1 mmol/L Final  . Chloride 11/09/2019 103  98 - 111 mmol/L Final  . CO2 11/09/2019 23  22 - 32 mmol/L Final  . Glucose, Bld 11/09/2019 101* 70 - 99 mg/dL Final   Glucose reference range applies only to samples taken after fasting for at least 8 hours.  . BUN 11/09/2019 21* 6 -  20 mg/dL Final  . Creatinine, Ser 11/09/2019 1.09  0.61 - 1.24 mg/dL Final  . Calcium 11/09/2019 9.9  8.9 - 10.3 mg/dL Final  . Total Protein 11/09/2019 7.8  6.5 - 8.1 g/dL Final  . Albumin 11/09/2019 4.5  3.5 - 5.0 g/dL Final  . AST 11/09/2019 91* 15 - 41 U/L Final  . ALT 11/09/2019 53* 0 - 44 U/L Final  . Alkaline Phosphatase 11/09/2019 91  38 - 126 U/L Final  . Total Bilirubin 11/09/2019 0.8  0.3 - 1.2 mg/dL Final  . GFR, Estimated 11/09/2019 >60  >60 mL/min Final   Comment: (NOTE) Calculated using the CKD-EPI Creatinine Equation (2021)   . Anion gap 11/09/2019 10  5 - 15 Final   Performed at [facility], [place] [address], [place], [place] [address]  . TSH 11/09/2019 0.931  0.350 - 4.500 uIU/mL Final   Comment: Performed by a 3rd Generation assay with a functional sensitivity of <=0.01 uIU/mL. Performed at [facility], [place] [address], [place], [place] [address]   . Alcohol, Ethyl (B) 11/09/2019 <10  <10 mg/dL Final   Comment: (NOTE) Lowest detectable limit for serum alcohol is 10 mg/dL.  For medical purposes only. Performed at [facility], [place] [address], [place],  [address]   . SARS Coronavirus 2 Ag 11/09/2019 NEGATIVE  NEGATIVE Final   Comment: (NOTE) SARS-CoV-2 antigen NOT DETECTED.   Negative results are presumptive.  Negative results do not preclude SARS-CoV-2 infection and should not be used as the sole basis for treatment or other patient management decisions, including infection  control decisions, particularly in the presence of clinical signs and  symptoms consistent with COVID-19, or in those who have been in contact with the virus.  Negative results must be combined with clinical observations, patient history, and epidemiological information. The expected result is Negative.  Fact Sheet for Patients: PodPark.tn  Fact Sheet for Healthcare Providers: GiftContent.is   This  test is not yet approved or cleared by the [place] FDA and  has been authorized for detection and/or diagnosis of SARS-CoV-2 by FDA under an Emergency Use Authorization (EUA).  This EUA will remain in effect (meaning this test can be used) for the duration of  the C                          OVID-19 declaration under Section 564(b)(1) of the Act, 21 U.S.C. section 360bbb-3(b)(1), unless the authorization is terminated or revoked sooner.    . Cholesterol 11/09/2019 211* 0 - 200 mg/dL Final  . Triglycerides 11/09/2019 92  <150 mg/dL Final  . HDL 11/09/2019 47  >40 mg/dL Final  .  Total CHOL/HDL Ratio 11/09/2019 4.5  RATIO Final  . VLDL 11/09/2019 18  0 - 40 mg/dL Final  . LDL Cholesterol 11/09/2019 146* 0 - 99 mg/dL Final   Comment:        Total Cholesterol/HDL:CHD Risk Coronary Heart Disease Risk Table                     Men   Women  1/2 Average Risk   3.4   3.3  Average Risk       5.0   4.4  2 X Average Risk   9.6   7.1  3 X Average Risk  23.4   11.0        Use the calculated Patient Ratio above and the CHD Risk Table to determine the patient's CHD Risk.        ATP III CLASSIFICATION (LDL):  <100     mg/dL   Optimal  100-129  mg/dL   Near or Above                    Optimal  130-159  mg/dL   Borderline  160-189  mg/dL   High  >190     mg/dL   Very High Performed at [facility] [address], [place], [place] [address]   Appointment on 10/15/2019  Component Date Value Ref Range Status  . RPR Ser Ql 10/15/2019 NON-REACTIVE  NON-REACTI Final  . CD4 T Cell Abs 10/15/2019 1,126  400 - 1,790 /uL Final  . CD4 % Helper T Cell 10/15/2019 35  33 - 65 % Final   Performed at [facility], [place] [address] [place], [place] [address]  . HIV 1 RNA Quant 10/15/2019 <20* Copies/mL Final   HIV-1 RNA Detected  . HIV-1 RNA Quant, Log 10/15/2019 <1.30* Log cps/mL Final   Comment: HIV-1 RNA Detected . HIV-1 RNA was detected below 20 copies/mL. Viral  nucleic acid detected below this level cannot be quantified by the assay. . . Reference Range:                           Not Detected     copies/mL                           Not Detected Log copies/mL . Marland Kitchen The test was performed using Real-Time Polymerase Chain Reaction. . . Reportable Range: 20 copies/mL to 10,000,000 copies/mL (1.30 Log copies/mL to 7.00 Log copies/mL). .   Admission on 09/17/2019, Discharged on 09/17/2019  Component Date Value Ref Range Status  . SARS Coronavirus 2 09/17/2019 NEGATIVE  NEGATIVE Final   Comment: (NOTE) SARS-CoV-2 target nucleic acids are NOT DETECTED.  The SARS-CoV-2 RNA is generally detectable in upper and lower respiratory specimens during the acute phase of infection. The lowest concentration of SARS-CoV-2 viral copies this assay can detect is 250 copies / mL. A negative result does not preclude SARS-CoV-2 infection and should not be used as the sole basis for treatment or other patient management decisions.  A negative result may occur with improper specimen collection / handling, submission of specimen other than nasopharyngeal swab, presence of viral mutation(s) within the areas targeted by this assay, and inadequate number of viral copies (<250 copies / mL). A negative result must be combined with clinical observations, patient history, and epidemiological information.  Fact Sheet for Patients:   StrictlyIdeas.no  Fact Sheet for Healthcare Providers: BankingDealers.co.za  This test is not yet approved or                           cleared by the [place] FDA and has been authorized for detection and/or diagnosis of SARS-CoV-2 by FDA under an Emergency Use Authorization (EUA).  This EUA will remain in effect (meaning this test can be used) for the duration of the COVID-19 declaration under Section 564(b)(1) of the Act, 21 U.S.C. section 360bbb-3(b)(1), unless the authorization  is terminated or revoked sooner.  Performed at [facility], [place] [address], [place], [place] [address]   . Sodium 09/17/2019 138  135 - 145 mmol/L Final  . Potassium 09/17/2019 4.1  3.5 - 5.1 mmol/L Final  . Chloride 09/17/2019 105  98 - 111 mmol/L Final  . CO2 09/17/2019 23  22 - 32 mmol/L Final  . Glucose, Bld 09/17/2019 97  70 - 99 mg/dL Final   Glucose reference range applies only to samples taken after fasting for at least 8 hours.  . BUN 09/17/2019 11  6 - 20 mg/dL Final  . Creatinine, Ser 09/17/2019 1.07  0.61 - 1.24 mg/dL Final  . Calcium 09/17/2019 9.4  8.9 - 10.3 mg/dL Final  . Total Protein 09/17/2019 7.5  6.5 - 8.1 g/dL Final  . Albumin 09/17/2019 4.0  3.5 - 5.0 g/dL Final  . AST 09/17/2019 40  15 - 41 U/L Final  . ALT 09/17/2019 49* 0 - 44 U/L Final  . Alkaline Phosphatase 09/17/2019 77  38 - 126 U/L Final  . Total Bilirubin 09/17/2019 0.7  0.3 - 1.2 mg/dL Final  . GFR calc non Af Amer 09/17/2019 >60  >60 mL/min Final  . GFR calc Af Amer 09/17/2019 >60  >60 mL/min Final  . Anion gap 09/17/2019 10  5 - 15 Final   Performed at [facility] [address] [place], [place] [address]  . WBC 09/17/2019 5.7  4.0 - 10.5 K/uL Final  . RBC 09/17/2019 5.18  4.22 - 5.81 MIL/uL Final  . Hemoglobin 09/17/2019 16.1  13.0 - 17.0 g/dL Final  . HCT 09/17/2019 48.7  39 - 52 % Final  . MCV 09/17/2019 94.0  80.0 - 100.0 fL Final  . MCH 09/17/2019 31.1  26.0 - 34.0 pg Final  . MCHC 09/17/2019 33.1  30.0 - 36.0 g/dL Final  . RDW 09/17/2019 12.9  11.5 - 15.5 % Final  . Platelets 09/17/2019 178  150 - 400 K/uL Final  . nRBC 09/17/2019 0.0  0.0 - 0.2 % Final   Performed at [facility], [place] [address] [place], [place] [address]  . Glucose-Capillary 09/17/2019 99  70 - 99 mg/dL Final   Glucose reference range applies only to samples taken after fasting for at least 8 hours.  Admission on 07/11/2019, Discharged on 07/12/2019  Component Date Value Ref Range Status  . WBC  07/11/2019 10.6* 4.0 - 10.5 K/uL Final  . RBC 07/11/2019 4.83  4.22 - 5.81 MIL/uL Final  . Hemoglobin 07/11/2019 14.9  13.0 - 17.0 g/dL Final  . HCT 07/11/2019 44.7  39 - 52 % Final  . MCV 07/11/2019 92.5  80.0 - 100.0 fL Final  . MCH 07/11/2019 30.8  26.0 - 34.0 pg Final  . MCHC 07/11/2019 33.3  30.0 - 36.0 g/dL Final  . RDW 07/11/2019 13.0  11.5 - 15.5 % Final  .  Platelets 07/11/2019 180  150 - 400 K/uL Final  . nRBC 07/11/2019 0.0  0.0 - 0.2 % Final  . Neutrophils Relative % 07/11/2019 76  % Final  . Neutro Abs 07/11/2019 8.2* 1.7 - 7.7 K/uL Final  . Lymphocytes Relative 07/11/2019 17  % Final  . Lymphs Abs 07/11/2019 1.8  0.7 - 4.0 K/uL Final  . Monocytes Relative 07/11/2019 6  % Final  . Monocytes Absolute 07/11/2019 0.6  0.1 - 1.0 K/uL Final  . Eosinophils Relative 07/11/2019 0  % Final  . Eosinophils Absolute 07/11/2019 0.0  0.0 - 0.5 K/uL Final  . Basophils Relative 07/11/2019 0  % Final  . Basophils Absolute 07/11/2019 0.0  0.0 - 0.1 K/uL Final  . Immature Granulocytes 07/11/2019 1  % Final  . Abs Immature Granulocytes 07/11/2019 0.05  0.00 - 0.07 K/uL Final   Performed at [facility], [place] [address], [place], [place] [address]  . Sodium 07/11/2019 136  135 - 145 mmol/L Final  . Potassium 07/11/2019 3.7  3.5 - 5.1 mmol/L Final  . Chloride 07/11/2019 103  98 - 111 mmol/L Final  . CO2 07/11/2019 24  22 - 32 mmol/L Final  . Glucose, Bld 07/11/2019 93  70 - 99 mg/dL Final   Glucose reference range applies only to samples taken after fasting for at least 8 hours.  . BUN 07/11/2019 15  6 - 20 mg/dL Final  . Creatinine, Ser 07/11/2019 0.88  0.61 - 1.24 mg/dL Final  . Calcium 07/11/2019 9.7  8.9 - 10.3 mg/dL Final  . Total Protein 07/11/2019 7.9  6.5 - 8.1 g/dL Final  . Albumin 07/11/2019 4.5  3.5 - 5.0 g/dL Final  . AST 07/11/2019 38  15 - 41 U/L Final  . ALT 07/11/2019 54* 0 - 44 U/L Final  . Alkaline Phosphatase 07/11/2019 86  38 - 126 U/L Final  . Total Bilirubin  07/11/2019 0.8  0.3 - 1.2 mg/dL Final  . GFR calc non Af Amer 07/11/2019 >60  >60 mL/min Final  . GFR calc Af Amer 07/11/2019 >60  >60 mL/min Final  . Anion gap 07/11/2019 9  5 - 15 Final   Performed at [facility], [place] [address], [place], [place] [address]  . Hgb A1c MFr Bld 07/11/2019 6.0* 4.8 - 5.6 % Final   Comment: (NOTE) Pre diabetes:          5.7%-6.4%  Diabetes:              >6.4%  Glycemic control for   <7.0% adults with diabetes   . Mean Plasma Glucose 07/11/2019 125.5  mg/dL Final   Performed at [facility] [address] [place], [place] [address]  . Alcohol, Ethyl (B) 07/11/2019 <10  <10 mg/dL Final   Comment: (NOTE) Lowest detectable limit for serum alcohol is 10 mg/dL.  For medical purposes only. Performed at [facility], [place] [address] [place], [place] [address]   . TSH 07/11/2019 0.910  0.350 - 4.500 uIU/mL Final   Comment: Performed by a 3rd Generation assay with a functional sensitivity of <=0.01 uIU/mL. Performed at [facility], [place] [address], [place], [place] [address]   . POC Amphetamine UR 07/11/2019 None Detected  None Detected Final  . POC Secobarbital (BAR) 07/11/2019 None Detected  None Detected Final  . POC Buprenorphine (BUP) 07/11/2019 None Detected  None Detected Final  . POC Oxazepam (BZO) 07/11/2019 None Detected  None  Detected Final  . POC Cocaine UR 07/11/2019 Positive* None Detected Final  . POC Methamphetamine UR 07/11/2019 None Detected  None Detected Final  . POC Morphine 07/11/2019 None Detected  None Detected Final  . POC Oxycodone UR 07/11/2019 None Detected  None Detected Final  . POC Methadone UR 07/11/2019 None Detected  None Detected Final  . POC Marijuana UR 07/11/2019 None Detected  None Detected Final  . SARS Coronavirus 2 Ag 07/11/2019 NEGATIVE  NEGATIVE Final   Performed at [facility], [place] [address], [place], [place] [address]  . Cholesterol 07/11/2019 198  0 - 200 mg/dL Final  .  Triglycerides 07/11/2019 52  <150 mg/dL Final  . HDL 07/11/2019 52  >40 mg/dL Final  . Total CHOL/HDL Ratio 07/11/2019 3.8  RATIO Final  . VLDL 07/11/2019 10  0 - 40 mg/dL Final  . LDL Cholesterol 07/11/2019 136* 0 - 99 mg/dL Final   Comment:        Total Cholesterol/HDL:CHD Risk Coronary Heart Disease Risk Table                     Men   Women  1/2 Average Risk   3.4   3.3  Average Risk       5.0   4.4  2 X Average Risk   9.6   7.1  3 X Average Risk  23.4   11.0        Use the calculated Patient Ratio above and the CHD Risk Table to determine the patient's CHD Risk.        ATP III CLASSIFICATION (LDL):  <100     mg/dL   Optimal  100-129  mg/dL   Near or Above                    Optimal  130-159  mg/dL   Borderline  160-189  mg/dL   High  >190     mg/dL   Very High Performed at [facility] [address], [place], [place] [address]     Allergies: Penicillins, Latex, and Tape  PTA Medications: (Not in a hospital admission)   Medical Decision Making  Patient to be reassessed, continue overnight observation -Restarted home medications    Recommendations  Based on my evaluation the patient does not appear to have an emergency medical condition.  Derrill Center, NP 11/09/19  12:45 PM

## 2019-11-09 NOTE — ED Notes (Signed)
Patient given sandwich and soda. 

## 2019-11-09 NOTE — ED Notes (Signed)
Pt asleep with even and unlabored respirations. No distress or discomfort noted. Pt remains safe on the unit. Will continue to monitor. 

## 2019-11-09 NOTE — ED Notes (Signed)
Pt brought in by [employer], presents with suicidal ideations, plan to shoot self in the head.  No access to guns in home.  Auditory, visual hallucinations.  Pt reports seeing, gigamytes gnawing on his skin he reports.  Denies HI.  Pt states he has a walking boot, related to surgery on rt foot in September.  Pt reports he has not had doses of  Seroquel in mos.  Pt calm & cooperative at present. No distress noted.

## 2019-11-09 NOTE — ED Notes (Signed)
Pt stated did not have to urinate at this time. Will try to collect urine sample at later time

## 2019-11-09 NOTE — ED Triage Notes (Signed)
[employer] transport, pt presents with suicidal ideations and AVH.
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
## 2019-11-09 NOTE — BH Assessment (Signed)
Per Ricky Ala, NP patient to remain in observation overnight. Pending am psych evaluation.

## 2019-11-09 NOTE — ED Notes (Signed)
Patient belongings placed in locker # [phone]

## 2019-11-09 NOTE — ED Notes (Signed)
Blood drawn using 23g needle from pt left arm ac. No issues noted. Pressure bandage applied. Will continue to monitor for safety

## 2019-11-09 NOTE — ED Notes (Addendum)
Pt arrived voluntarily via [employer]. Pt assisted with ambulation because of surgery he had on his left foot 2 months ago. Pt denies SI/HI and AVH. Pt took a shower when he got onto the unit and ambulated on his own and walked slowly to and from the bathroom. Pt is pleasant and cooperative. Education, support and encouragement provided. Pt denies any concerns at this time.  Pt remains safe on the unit.

## 2019-11-10 LAB — PROLACTIN: Prolactin: 2.6 ng/mL — ABNORMAL LOW (ref 4.0–15.2)

## 2019-11-10 MED ORDER — TRAZODONE HCL 100 MG PO TABS: 50.0000 mg | ORAL_TABLET | Freq: Every day | ORAL | 0 refills | Status: DC

## 2019-11-10 MED ORDER — GABAPENTIN 300 MG PO CAPS: 300.0000 mg | ORAL_CAPSULE | Freq: Two times a day (BID) | ORAL | 0 refills | Status: DC

## 2019-11-10 MED ORDER — GABAPENTIN 300 MG PO CAPS: 300.0000 mg | ORAL_CAPSULE | Freq: Two times a day (BID) | ORAL | Status: DC

## 2019-11-10 MED ORDER — QUETIAPINE FUMARATE 50 MG PO TABS: 50.0000 mg | ORAL_TABLET | Freq: Two times a day (BID) | ORAL | 0 refills | Status: DC

## 2019-11-10 MED ORDER — HYDROXYZINE HCL 25 MG PO TABS: 25.0000 mg | ORAL_TABLET | ORAL | 0 refills | Status: DC | PRN

## 2019-11-10 NOTE — ED Notes (Signed)
Nurse Discharge Note:  D:Patient denies SI/HI/AVH at this time. Pt appears calm and cooperative, and no distress noted.  A: All Personal items in locker returned to pt. Pt given AVS/ Follow-Up/Prescriptions.  Pt escorted off unit to meet safe transport driver.  R:  Pt States he will comply with outpatient services, and take medications as prescribed.

## 2019-11-10 NOTE — ED Notes (Signed)
Patient has some aggressive tones. Patient expressing dissatisfaction with service> Patient states he want to be discharged

## 2019-11-10 NOTE — ED Notes (Signed)
Pt sleeping@this time. Breathing even and unlabored. Will continue to monitor for safety 

## 2019-11-10 NOTE — Discharge Instructions (Addendum)
Patient is instructed prior to discharge to: Take all medications as prescribed by his/her mental healthcare provider. Report any adverse effects and or reactions from the medicines to his/her outpatient provider promptly. Patient has been instructed & cautioned: To not engage in alcohol and or illegal drug use while on prescription medicines. In the event of worsening symptoms, patient is instructed to call the crisis hotline, 911 and or go to the nearest ED for appropriate evaluation and treatment of symptoms. To follow-up with his/her primary care provider for your other medical issues, concerns and or health care needs.      You have an appointment to begin Chemical Dependency Intensive Outpatient therapy (CDIOP) on 11/30 at 3pm at [facility] 2nd floor [address] [place], [place]  [phone]

## 2019-11-10 NOTE — ED Notes (Signed)
Patient sitting on bed with eyes opened. No distress noted. Monitoring continues.

## 2019-11-10 NOTE — ED Provider Notes (Signed)
FBC/OBS ASAP Discharge Summary  Date and Time: 11/10/2019 11:19 AM  Name: Dylan [name]  MRN:  [id number]   Discharge Diagnoses:  Final diagnoses:  Schizoaffective disorder, depressive type ([place])  Cocaine abuse with cocaine-induced mood disorder ([place])    Subjective: Patient reports today that he is feeling much better and that he was restarted his medications.  He states that he used to go to [facility] but has not heard from them.  He reports that he has [employer] [employer] and that they used to supply him with his medications.  He states that he has a place to live he just has not had his medications.  He states that he follows up with his infectious disease doctor for his [place].  Patient denies having any suicidal or homicidal ideations and denies any hallucinations.  Patient reports that he needs to get back on his services for substance abuse.  He is informed of the open access hours as well as the CD IOP at the [facility].  Patient reports that he would like to get involved with all of those services.  Stay Summary: Patient is a 54 year old male who presented to the [facility] as a walk-in accompanied by law enforcement with reports of bizarre behavior and reporting auditory and visual hallucinations.  Patient was observed responding to internal stimuli, but the patient denied any suicidal or homicidal ideations.  Patient also reported recent substance use of cocaine.  Patient was admitted to the continuous observation unit for overnight assessment.  This morning the patient reports that he is feeling much better and denies any suicidal or homicidal ideations and denies any hallucinations.  Patient reports that he was going to [facility] and needs to be restarted on his medications.  He was unaware of him needing to [facility].  Patient was started on his Neurontin 300 mg p.o. 3 times daily, Seroquel 50 mg p.o. twice daily and was continued on his Biktarvy.  Patient requested prescriptions for  his medications and states that he will follow-up at the [facility] open access and also requested a CD IOP appointment.  Patient was provided with open access hours as well as an appointment was made for him for CD IOP on 12/09/2019 at 3 PM.  Patient's medications were E prescribed to pharmacy of choice.  Patient reported that he receives his [place] for his HIV from his infectious disease doctor.  Patient continued denying any suicidal or homicidal ideations and denied any hallucinations.  Total Time spent with patient: 30 minutes  Past Psychiatric History: Bipolar 1 disorder, anxiety, MDD, schizophrenia, cocaine abuse Past Medical History:  Past Medical History:  Diagnosis Date   Anxiety    Arthritis    Bipolar 1 disorder ([place])    Colon polyps    Depression    GERD (gastroesophageal reflux disease)    Hepatitis B    Human immunodeficiency virus (HIV) ([place])    Hyperlipidemia    Hypertension    Neuromuscular disorder ([place])    neuropathy   Neuropathy    Pre-diabetes    Schizophrenia ([place])     Past Surgical History:  Procedure Laterality Date   FOOT ARTHRODESIS Right 09/17/2019   Procedure: FUSION RIGHT LISFRANC JOINT;  Surgeon: Newt Minion, MD;  Location: [facility];  Service: Orthopedics;  Laterality: Right;   HEMORROIDECTOMY     IR FLUORO GUIDE CV LINE RIGHT  02/22/2017   IR US GUIDE VASC ACCESS RIGHT  02/22/2017   TUMOR REMOVAL  From Chest   Family History:  Family History  Problem Relation Age of Onset   CAD Mother    Diabetes Father    Colon cancer Maternal Aunt 60   Diabetes Maternal Aunt    Heart disease Maternal Uncle    Esophageal cancer Neg Hx    Rectal cancer Neg Hx    Stomach cancer Neg Hx    Family Psychiatric History: None reported Social History:  Social History   Substance and Sexual Activity  Alcohol Use Yes   Comment: occassional use on weekends     Social History   Substance and Sexual Activity  Drug Use Not Currently    Types: Cocaine, Marijuana   Comment: last used cocaine and marijuana "years ago"    Social History   Socioeconomic History   Marital status: Married    Spouse name: Not on file   Number of children: 1   Years of education: Not on file   Highest education level: Not on file  Occupational History   Not on file  Tobacco Use   Smoking status: Current Every Day Smoker    Packs/day: 0.50    Years: 15.00    Pack years: 7.50    Types: Cigarettes   Smokeless tobacco: Never Used   Tobacco comment: has patches when he is ready to quit  Vaping Use   Vaping Use: Never used  Substance and Sexual Activity   Alcohol use: Yes    Comment: occassional use on weekends   Drug use: Not Currently    Types: Cocaine, Marijuana    Comment: last used cocaine and marijuana "years ago"   Sexual activity: Yes    Partners: Female    Comment: condoms given  Other Topics Concern   Not on file  Social History Narrative   Not on file   Social Determinants of Health   Financial Resource Strain:    Difficulty of Paying Living Expenses: Not on file  Food Insecurity:    Worried About Charity fundraiser in the Last Year: Not on file   [employer] of Food in the Last Year: Not on file  Transportation Needs:    Lack of Transportation (Medical): Not on file   Lack of Transportation (Non-Medical): Not on file  Physical Activity:    Days of Exercise per Week: Not on file   Minutes of Exercise per Session: Not on file  Stress:    Feeling of Stress : Not on file  Social Connections:    Frequency of Communication with Friends and Family: Not on file   Frequency of Social Gatherings with Friends and Family: Not on file   Attends Religious Services: Not on file   Active Member of Clubs or Organizations: Not on file   Attends Archivist Meetings: Not on file   Marital Status: Not on file   SDOH:  SDOH Screenings   Alcohol Screen:    Last Alcohol Screening Score  (AUDIT): Not on file  Depression (PHQ2-9): Medium Risk   PHQ-2 Score: 19  Financial Resource Strain:    Difficulty of Paying Living Expenses: Not on file  Food Insecurity:    Worried About Charity fundraiser in the Last Year: Not on file   [employer] of Food in the Last Year: Not on file  Housing:    Last Housing Risk Score: Not on file  Physical Activity:    Days of Exercise per Week: Not on file   Minutes of  Exercise per Session: Not on file  Social Connections:    Frequency of Communication with Friends and Family: Not on file   Frequency of Social Gatherings with Friends and Family: Not on file   Attends Religious Services: Not on file   Active Member of Clubs or Organizations: Not on file   Attends Archivist Meetings: Not on file   Marital Status: Not on file  Stress:    Feeling of Stress : Not on file  Tobacco Use: High Risk   Smoking Tobacco Use: Current Every Day Smoker   Smokeless Tobacco Use: Never Used  Transportation Needs:    Film/video editor (Medical): Not on file   Lack of Transportation (Non-Medical): Not on file    Has this patient used any form of tobacco in the last 30 days? (Cigarettes, Smokeless Tobacco, Cigars, and/or Pipes) A prescription for an FDA-approved tobacco cessation medication was offered at discharge and the patient refused  Current Medications:  Current Facility-Administered Medications  Medication Dose Route Frequency Provider Last Rate Last Admin   acetaminophen (TYLENOL) tablet 650 mg  650 mg Oral Q6H PRN Derrill Center, NP   650 mg at 11/09/19 2033   gabapentin (NEURONTIN) capsule 300 mg  300 mg Oral BID Derrill Center, NP       hydrOXYzine (ATARAX/VISTARIL) tablet 25 mg  25 mg Oral Q4H PRN Derrill Center, NP   25 mg at 11/09/19 1136   QUEtiapine (SEROQUEL) tablet 50 mg  50 mg Oral BID Derrill Center, NP   50 mg at 11/10/19 0917   traZODone (DESYREL) tablet 50 mg  50 mg Oral QHS,MR X 1 Derrill Center, NP       Current Outpatient Medications  Medication Sig Dispense Refill   bictegravir-emtricitabine-tenofovir AF (BIKTARVY) 50-200-25 MG TABS tablet Take 1 tablet by mouth daily.      rosuvastatin (CRESTOR) 20 MG tablet Take 20 mg by mouth daily.     gabapentin (NEURONTIN) 300 MG capsule Take 1 capsule (300 mg total) by mouth 2 (two) times daily. 60 capsule 0   hydrOXYzine (ATARAX/VISTARIL) 25 MG tablet Take 1 tablet (25 mg total) by mouth every 4 (four) hours as needed for anxiety. 30 tablet 0   QUEtiapine (SEROQUEL) 50 MG tablet Take 1 tablet (50 mg total) by mouth 2 (two) times daily. 60 tablet 0   traZODone (DESYREL) 100 MG tablet Take 0.5 tablets (50 mg total) by mouth at bedtime. 30 tablet 0    PTA Medications: (Not in a hospital admission)   Musculoskeletal  Strength & Muscle Tone: within normal limits Gait & Station: unsteady Patient leans: N/A  Psychiatric Specialty Exam  Presentation  General Appearance: Appropriate for Environment;Casual  Eye Contact:Good  Speech:Clear and Coherent;Normal Rate  Speech Volume:Normal  Handedness:Right   Mood and Affect  Mood:Depressed  Affect:Appropriate;Congruent   Thought Process  Thought Processes:Coherent  Descriptions of Associations:Intact  Orientation:Full (Time, Place and Person)  Thought Content:WDL  Hallucinations:Hallucinations: None  Ideas of Reference:None  Suicidal Thoughts:Suicidal Thoughts: No  Homicidal Thoughts:Homicidal Thoughts: No   Sensorium  Memory:Immediate Good;Recent Good;Remote Good  Judgment:Fair  Insight:Fair   Executive Functions  Concentration:Good  Attention Span:Good  Gobles of Knowledge:Good  Language:Good   Psychomotor Activity  Psychomotor Activity:Psychomotor Activity: Normal Extrapyramidal Side Effects (EPS): Other (comment) AIMS Completed?: No   Assets  Assets:Communication Skills;Desire for Improvement;Financial  Resources/Insurance;Housing;Social Support;Transportation   Sleep  Sleep:Sleep: Good   Physical Exam  Physical Exam Vitals and  nursing note reviewed.  Constitutional:      Appearance: He is well-developed.  HENT:     Head: Normocephalic.  Eyes:     Pupils: Pupils are equal, round, and reactive to light.  Cardiovascular:     Rate and Rhythm: Normal rate.  Pulmonary:     Effort: Pulmonary effort is normal.  Musculoskeletal:        General: Normal range of motion.  Neurological:     Mental Status: He is alert and oriented to person, place, and time.    Review of Systems  Constitutional: Negative.   HENT: Negative.   Eyes: Negative.   Respiratory: Negative.   Cardiovascular: Negative.   Gastrointestinal: Negative.   Genitourinary: Negative.   Musculoskeletal: Negative.   Skin: Negative.   Neurological: Negative.   Endo/Heme/Allergies: Negative.   Psychiatric/Behavioral: Positive for substance abuse.   Blood pressure 98/71, pulse (!) 55, temperature (!) 97.5 F (36.4 C), temperature source Oral, resp. rate 18, height 6\' 2"  (1.88 m), weight 230 lb (104.3 kg), SpO2 98 %. Body mass index is 29.53 kg/m.  Demographic Factors:  Male  Loss Factors: Decline in physical health  Historical Factors: NA  Risk Reduction Factors:   Sense of responsibility to family, Employed, Living with another person, especially a relative, Positive social support and Positive therapeutic relationship  Continued Clinical Symptoms:  Alcohol/Substance Abuse/Dependencies Previous Psychiatric Diagnoses and Treatments  Cognitive Features That Contribute To Risk:  None    Suicide Risk:  Mild:  Suicidal ideation of limited frequency, intensity, duration, and specificity.  There are no identifiable plans, no associated intent, mild dysphoria and related symptoms, good self-control (both objective and subjective assessment), few other risk factors, and identifiable protective factors, including  available and accessible social support.  Plan Of Care/Follow-up recommendations:  Continue activity as tolerated. Continue diet as recommended by your PCP. Ensure to keep all appointments with outpatient providers.  Disposition: Discharge home  Dylan [name], [place] 11/10/2019, 11:19 AM

## 2019-11-10 NOTE — ED Notes (Signed)
Patient resting in bed with eyes opened. Monitoring continues.

## 2019-11-10 NOTE — ED Notes (Signed)
Pt resting on pull out with eyes closed unlabored respirations through night in no acute distress. Safety maintained.

## 2019-11-12 ENCOUNTER — Ambulatory Visit: Payer: Medicaid Other | Admitting: Family

## 2019-11-18 ENCOUNTER — Ambulatory Visit (INDEPENDENT_AMBULATORY_CARE_PROVIDER_SITE_OTHER): Payer: Self-pay | Admitting: Physician Assistant

## 2019-11-18 ENCOUNTER — Encounter: Payer: Self-pay | Admitting: Physician Assistant

## 2019-11-18 VITALS — Ht 74.0 in | Wt 230.0 lb

## 2019-11-18 DIAGNOSIS — S93324D Dislocation of tarsometatarsal joint of right foot, subsequent encounter: Secondary | ICD-10-CM

## 2019-11-18 NOTE — Progress Notes (Signed)
Office Visit Note   Patient: Dylan [name]           Date of Birth: 04-Oct-1965           MRN: [id number] Visit Date: 11/18/2019              Requested by: Charlott Rakes, MD [place],  [place] [id number] PCP: Charlott Rakes, MD  Chief Complaint  Patient presents with  . Right Foot - Routine Post Op    09/17/19 fusion right Lisfranc joint       HPI: Patient presents today he is 2 months status post right foot fusion for Lisfranc injury.  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  He still has to use his cam walker boot.  This is usually after being active in the boot.  He is full weightbearing.  He also complains of numbness and decreased motion of his toes.  Assessment & Plan: Visit Diagnoses: No diagnosis found.  Plan: Status post above.  I had a long discussion with him today.  I believe it is too early in his recovery to do a MMI and disability rating.  I do not think he is ready to go back to work on ladders or uneven ground.  I did give him a prescription for custom orthotics arch supports for his shoes.  Given the nature of his fusion and injury I think this will be something he will continue to need.  He will follow-up with Dr. Sharol Given in 1 month.  At that time x-rays of his right foot should be taken  Follow-Up Instructions: No follow-ups on file.   Ortho Exam  Patient is alert, oriented, no adenopathy, well-dressed, normal affect, normal respiratory effort.  Focused examination of his foot demonstrates well-healed surgical incision.  Mild soft tissue swelling.  He has some sensitivity in his lesser toes to light touch.  Also some stiffness with movement of the lesser toes.  They are warm and pink.  He does have a palpable dorsalis pedis pulse.  Compartments are soft and compressible   Imaging: No results found. No images are attached to the encounter.  Labs: Lab Results  Component Value Date   HGBA1C 5.9 (H) 11/09/2019   HGBA1C 6.0 (H) 07/11/2019    HGBA1C 6.1 (H) 02/13/2019   REPTSTATUS 03/03/2017 FINAL 02/26/2017   CULT  02/26/2017    NO GROWTH 5 DAYS Performed at [facility], [place] [address] [place], [place] [address]      Lab Results  Component Value Date   ALBUMIN 4.5 11/09/2019   ALBUMIN 4.0 09/17/2019   ALBUMIN 4.5 07/11/2019    Lab Results  Component Value Date   MG 2.8 (H) 02/20/2017   No results found for: VD25OH  No results found for: PREALBUMIN CBC EXTENDED Latest Ref Rng & Units 11/09/2019 09/17/2019 07/11/2019  WBC 4.0 - 10.5 K/uL 10.7(H) 5.7 10.6(H)  RBC 4.22 - 5.81 MIL/uL 4.67 5.18 4.83  HGB 13.0 - 17.0 g/dL 14.7 16.1 14.9  HCT 39 - 52 % 43.5 48.7 44.7  PLT 150 - 400 K/uL 202 178 180  NEUTROABS 1.7 - 7.7 K/uL 7.1 - 8.2(H)  LYMPHSABS 0.7 - 4.0 K/uL 2.3 - 1.8     Body mass index is 29.53 kg/m.  Orders:  No orders of the defined types were placed in this encounter.  No orders of the defined types were placed in this encounter.    Procedures: No procedures performed  Clinical  Data: No additional findings.  ROS:  All other systems negative, except as noted in the HPI. Review of Systems  Objective: Vital Signs: Ht 6\' 2"  (1.88 m)   Wt 230 lb (104.3 kg)   BMI 29.53 kg/m   Specialty Comments:  No specialty comments available.  PMFS History: Patient Active Problem List   Diagnosis Date Noted  . Lisfranc dislocation, right, initial encounter   . Prediabetes 07/22/2019  . Tobacco abuse 07/10/2018  . ESRD needing dialysis ([place]) 03/03/2018  . Cocaine abuse with cocaine-induced mood disorder ([place]) 05/18/2017  . Schizoaffective disorder, bipolar type ([place])   . Acute renal failure (ARF) ([place])   . Suicidal ideations   . Chronic 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 coma ([place])   . AKI (acute kidney injury) ([place]) 02/19/2017  . Rhabdomyolysis 02/19/2017  . Suicidal overdose ([place]) 02/19/2017  . Acute hepatitis 02/19/2017  . Elevated LFTs   . Homicidal ideations   .  Schizoaffective disorder, depressive type ([place]) 09/05/2011  . Cocaine abuse with cocaine-induced psychotic disorder ([place]) 09/05/2011  . Degenerative disc disease 08/21/2011  . HIV disease ([place]) 08/17/2011   Past Medical History:  Diagnosis Date  . Anxiety   . Arthritis   . Bipolar 1 disorder ([place])   . Colon polyps   . Depression   . GERD (gastroesophageal reflux disease)   . Hepatitis B   . Human immunodeficiency virus (HIV) ([place])   . Hyperlipidemia   . Hypertension   . Neuromuscular disorder ([facility])    neuropathy  . Neuropathy   . Pre-diabetes   . Schizophrenia ([place])     Family History  Problem Relation Age of Onset  . CAD Mother   . Diabetes Father   . Colon cancer Maternal Aunt 64  . Diabetes Maternal Aunt   . Heart disease Maternal Uncle   . Esophageal cancer Neg Hx   . Rectal cancer Neg Hx   . Stomach cancer Neg Hx     Past Surgical History:  Procedure Laterality Date  . FOOT ARTHRODESIS Right 09/17/2019   Procedure: FUSION RIGHT LISFRANC JOINT;  Surgeon: Newt Minion, MD;  Location: [facility];  Service: Orthopedics;  Laterality: Right;  . HEMORROIDECTOMY    . IR FLUORO GUIDE CV LINE RIGHT  02/22/2017  . IR US GUIDE VASC ACCESS RIGHT  02/22/2017  . TUMOR REMOVAL     From Chest   Social History   Occupational History  . Not on file  Tobacco Use  . Smoking status: Current Every Day Smoker    Packs/day: 0.50    Years: 15.00    Pack years: 7.50    Types: Cigarettes  . Smokeless tobacco: Never Used  . Tobacco comment: has patches when he is ready to quit  Vaping Use  . Vaping Use: Never used  Substance and Sexual Activity  . Alcohol use: Yes    Comment: occassional use on weekends  . Drug use: Not Currently    Types: Cocaine, Marijuana    Comment: last used cocaine and marijuana "years ago"  . Sexual activity: Yes    Partners: Female    Comment: condoms given

## 2019-11-19 ENCOUNTER — Encounter: Payer: Self-pay | Admitting: Family Medicine

## 2019-11-19 ENCOUNTER — Ambulatory Visit: Payer: Medicaid Other | Attending: Family Medicine | Admitting: Family Medicine

## 2019-11-19 ENCOUNTER — Other Ambulatory Visit: Payer: Self-pay

## 2019-11-19 DIAGNOSIS — F250 Schizoaffective disorder, bipolar type: Secondary | ICD-10-CM | POA: Diagnosis not present

## 2019-11-19 DIAGNOSIS — G629 Polyneuropathy, unspecified: Secondary | ICD-10-CM

## 2019-11-19 DIAGNOSIS — Z720 Tobacco use: Secondary | ICD-10-CM

## 2019-11-19 DIAGNOSIS — E7800 Pure hypercholesterolemia, unspecified: Secondary | ICD-10-CM

## 2019-11-19 MED ORDER — ROSUVASTATIN CALCIUM 20 MG PO TABS: 20.0000 mg | ORAL_TABLET | Freq: Every day | ORAL | 3 refills | Status: DC

## 2019-11-19 MED ORDER — BUPROPION HCL ER (XL) 150 MG PO TB24: 150.0000 mg | ORAL_TABLET | Freq: Every day | ORAL | 3 refills | Status: DC

## 2019-11-19 MED ORDER — GABAPENTIN 300 MG PO CAPS: 600.0000 mg | ORAL_CAPSULE | Freq: Two times a day (BID) | ORAL | 3 refills | Status: DC

## 2019-11-19 NOTE — Progress Notes (Signed)
Virtual Visit via Telephone Note  I connected with Dylan [name], on 11/19/2019 at 9:46 AM by telephone due to the COVID-19 pandemic and verified that I am speaking with the correct person using two identifiers.   Consent: I discussed the limitations, risks, security and privacy concerns of performing an evaluation and management service by telephone and the availability of in person appointments. I also discussed with the patient that there may be a patient responsible charge related to this service. The patient expressed understanding and agreed to proceed.   Location of Patient: Home  Location of Provider: Clinic   Persons participating in Telemedicine visit: Zeev Deakins [place]-CMA Dr. Margarita Rana     History of Present Illness: Dylan [name] a48 year old male with a history of HIV(on antiretroviral therapy), hypertension, schizoaffectivedisorder, chronic left knee pain, right second metatarsal fracture status post right foot fusion in 09/2019. He complains of worsening neuropathy since his surgery and has had to increase his dose of gabapentin.  2 capsules of Gabapentin bid has been more effective than the 2 capsules daily which he was prescribed.   Both of his sisters had a 'heart attack' and he is worried that he could have a heart attack.  He has no chest pain, dyspnea.  His lipid panel from last month revealed elevated cholesterol for which he was commenced on Crestor. Smokes 1 pack of cig in a day and a half Complains of patches being too expensive but he is willing to try oral pills.  For his schizoaffective disorder, he will be seeing [facility] at the end of this month as he had to transfer from [place] due to distance.  Past Medical History:  Diagnosis Date  . Anxiety   . Arthritis   . Bipolar 1 disorder ([place])   . Colon polyps   . Depression   . GERD (gastroesophageal reflux disease)   . Hepatitis B   . Human immunodeficiency virus (HIV) ([place])   .  Hyperlipidemia   . Hypertension   . Neuromuscular disorder ([facility])    neuropathy  . Neuropathy   . Pre-diabetes   . Schizophrenia ([place])    Allergies  Allergen Reactions  . Penicillins Other (See Comments)    Childhood allergy  . Latex Rash  . Tape Rash    Current Outpatient Medications on File Prior to Visit  Medication Sig Dispense Refill  . bictegravir-emtricitabine-tenofovir AF (BIKTARVY) 50-200-25 MG TABS tablet Take 1 tablet by mouth daily.     Marland Kitchen gabapentin (NEURONTIN) 300 MG capsule Take 1 capsule (300 mg total) by mouth 2 (two) times daily. 60 capsule 0  . hydrOXYzine (ATARAX/VISTARIL) 25 MG tablet Take 1 tablet (25 mg total) by mouth every 4 (four) hours as needed for anxiety. 30 tablet 0  . QUEtiapine (SEROQUEL) 50 MG tablet Take 1 tablet (50 mg total) by mouth 2 (two) times daily. 60 tablet 0  . rosuvastatin (CRESTOR) 20 MG tablet Take 20 mg by mouth daily.    . traZODone (DESYREL) 100 MG tablet Take 0.5 tablets (50 mg total) by mouth at bedtime. 30 tablet 0   No current facility-administered medications on file prior to visit.    Observations/Objective: Awake, alert, oriented x3 Not in acute distress  Assessment and Plan: 1. Pure hypercholesterolemia Uncontrolled Counseled on low-cholesterol diet Refilled Crestor Lipid panel at next visit in 3 months - rosuvastatin (CRESTOR) 20 MG tablet; Take 1 tablet (20 mg total) by mouth daily.  Dispense: 30 tablet; Refill: 3  2. Neuropathy  Uncontrolled Advised to increase gabapentin to 2 capsules twice daily which has been effective for him His antiretroviral therapy could also be contributing to his neuropathy - gabapentin (NEURONTIN) 300 MG capsule; Take 2 capsules (600 mg total) by mouth 2 (two) times daily.  Dispense: 120 capsule; Refill: 3  3. Schizoaffective disorder, bipolar type ([place]) Stable Management as per psych  4. Tobacco abuse Spent 3 minutes counseling on smoking cessation and he is willing to work on  quitting Placed on bupropion - buPROPion (WELLBUTRIN XL) 150 MG 24 hr tablet; Take 1 tablet (150 mg total) by mouth daily.  Dispense: 30 tablet; Refill: 3   Follow Up Instructions: 3 months for chronic disease management   I discussed the assessment and treatment plan with the patient. The patient was provided an opportunity to ask questions and all were answered. The patient agreed with the plan and demonstrated an understanding of the instructions.   The patient was advised to call back or seek an in-person evaluation if the symptoms worsen or if the condition fails to improve as anticipated.     I provided 17 minutes total of non-face-to-face time during this encounter including median intraservice time, reviewing previous notes, investigations, ordering medications, medical decision making, coordinating care and patient verbalized understanding at the end of the visit.     Charlott Rakes, MD, FAAFP. [facility] and [place] [place], [place]   11/19/2019, 9:46 AM

## 2019-11-19 NOTE — Progress Notes (Signed)
Wants to get cholesterol checked.  Having neuropathy pain.

## 2019-11-20 ENCOUNTER — Telehealth: Payer: Self-pay | Admitting: *Deleted

## 2019-11-20 NOTE — Telephone Encounter (Signed)
Patient called to schedule flu and covid booster vaccines. He would like both on the same day, needs 9am appointment due to work schedule.  Patient scheduled for this week 11/12 at 9:00. Landis Gandy, RN

## 2019-11-21 ENCOUNTER — Ambulatory Visit: Payer: Medicaid Other

## 2019-12-01 ENCOUNTER — Other Ambulatory Visit: Payer: Self-pay

## 2019-12-01 ENCOUNTER — Telehealth (INDEPENDENT_AMBULATORY_CARE_PROVIDER_SITE_OTHER): Payer: Medicaid Other | Admitting: Internal Medicine

## 2019-12-01 ENCOUNTER — Encounter: Payer: Self-pay | Admitting: Internal Medicine

## 2019-12-01 DIAGNOSIS — B20 Human immunodeficiency virus [HIV] disease: Secondary | ICD-10-CM

## 2019-12-01 NOTE — Progress Notes (Signed)
ZOX:WRUEAV up for hiv disease  Patient ID: Dylan [name], male   DOB: [birth year], 54 y.o.   MRN: [id number]  HPI Alcario is a 54yo M with well controlled hiv disease, CD 4 count of 1126/VL<20 (oct 2021) taking biktarvy routinely  Outpatient Encounter Medications as of 12/01/2019  Medication Sig   bictegravir-emtricitabine-tenofovir AF (BIKTARVY) 50-200-25 MG TABS tablet Take 1 tablet by mouth daily.    buPROPion (WELLBUTRIN XL) 150 MG 24 hr tablet Take 1 tablet (150 mg total) by mouth daily.   gabapentin (NEURONTIN) 300 MG capsule Take 2 capsules (600 mg total) by mouth 2 (two) times daily.   hydrOXYzine (ATARAX/VISTARIL) 25 MG tablet Take 1 tablet (25 mg total) by mouth every 4 (four) hours as needed for anxiety.   QUEtiapine (SEROQUEL) 50 MG tablet Take 1 tablet (50 mg total) by mouth 2 (two) times daily.   rosuvastatin (CRESTOR) 20 MG tablet Take 1 tablet (20 mg total) by mouth daily.   traZODone (DESYREL) 100 MG tablet Take 0.5 tablets (50 mg total) by mouth at bedtime.   No facility-administered encounter medications on file as of 12/01/2019.     Patient Active Problem List   Diagnosis Date Noted   Lisfranc dislocation, right, initial encounter    Prediabetes 07/22/2019   Tobacco abuse 07/10/2018   ESRD needing dialysis (HCC) 03/03/2018   Cocaine abuse with cocaine-induced mood disorder (HCC) 05/18/2017   Schizoaffective disorder, bipolar type (HCC)    Acute renal failure (ARF) (HCC)    Suicidal ideations    Chronic 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 coma (HCC)    AKI (acute kidney injury) (HCC) 02/19/2017   Rhabdomyolysis 02/19/2017   Suicidal overdose (HCC) 02/19/2017   Acute hepatitis 02/19/2017   Elevated LFTs    Homicidal ideations    Schizoaffective disorder, depressive type (HCC) 09/05/2011   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   Lab Results  Component Value Date   CD4TCELL 35 10/15/2019   Lab Results  Component Value Date   CD4TABS 1,126 10/15/2019   CD4TABS 1,176 03/24/2019   CD4TABS 1,136 09/11/2018   Lab Results  Component Value Date   HIV1RNAQUANT <20 (H) 10/15/2019   Lab Results  Component Value Date   HEPBSAB NON-REACTIVE 07/24/2017   Lab Results  Component Value Date   LABRPR NON-REACTIVE 10/15/2019    CBC Lab Results  Component Value Date   WBC 10.7 (H) 11/09/2019   RBC 4.67 11/09/2019   HGB 14.7 11/09/2019   HCT 43.5 11/09/2019   PLT 202 11/09/2019   MCV 93.1 11/09/2019   MCH 31.5 11/09/2019   MCHC 33.8 11/09/2019   RDW 13.4 11/09/2019   LYMPHSABS 2.3 11/09/2019   MONOABS 1.1 (H) 11/09/2019   EOSABS 0.1 11/09/2019    BMET Lab Results  Component Value Date   NA 136 11/09/2019   K 3.9 11/09/2019   CL 103 11/09/2019   CO2 23 11/09/2019   GLUCOSE 101 (H) 11/09/2019   BUN 21 (H) 11/09/2019   CREATININE 1.09 11/09/2019   CALCIUM 9.9 11/09/2019   GFRNONAA >60 11/09/2019   GFRAA >60 09/17/2019      Assessment and Plan   -------------------------no show--

## 2019-12-03 ENCOUNTER — Encounter: Payer: Self-pay | Admitting: Orthopedic Surgery

## 2019-12-09 ENCOUNTER — Ambulatory Visit (INDEPENDENT_AMBULATORY_CARE_PROVIDER_SITE_OTHER): Payer: Medicaid Other | Admitting: Behavioral Health

## 2019-12-09 ENCOUNTER — Ambulatory Visit (INDEPENDENT_AMBULATORY_CARE_PROVIDER_SITE_OTHER): Payer: Medicaid Other

## 2019-12-09 ENCOUNTER — Ambulatory Visit (INDEPENDENT_AMBULATORY_CARE_PROVIDER_SITE_OTHER): Payer: Medicaid Other | Admitting: Orthopedic Surgery

## 2019-12-09 ENCOUNTER — Other Ambulatory Visit: Payer: Self-pay

## 2019-12-09 DIAGNOSIS — S93324A Dislocation of tarsometatarsal joint of right foot, initial encounter: Secondary | ICD-10-CM

## 2019-12-09 DIAGNOSIS — M25562 Pain in left knee: Secondary | ICD-10-CM

## 2019-12-09 DIAGNOSIS — F1414 Cocaine abuse with cocaine-induced mood disorder: Secondary | ICD-10-CM | POA: Diagnosis not present

## 2019-12-09 NOTE — Progress Notes (Signed)
Surgery in September 3 screws in right foot - hurt on the job  SOC: [place]

## 2019-12-12 ENCOUNTER — Ambulatory Visit: Payer: Medicaid Other

## 2019-12-23 ENCOUNTER — Ambulatory Visit (INDEPENDENT_AMBULATORY_CARE_PROVIDER_SITE_OTHER): Payer: Medicaid Other | Admitting: Physician Assistant

## 2019-12-23 ENCOUNTER — Other Ambulatory Visit: Payer: Self-pay

## 2019-12-23 ENCOUNTER — Encounter (HOSPITAL_COMMUNITY): Payer: Self-pay | Admitting: Physician Assistant

## 2019-12-23 VITALS — BP 143/85 | HR 87 | Temp 98.3°F | Ht 73.0 in | Wt 239.0 lb

## 2019-12-23 DIAGNOSIS — F99 Mental disorder, not otherwise specified: Secondary | ICD-10-CM

## 2019-12-23 DIAGNOSIS — F250 Schizoaffective disorder, bipolar type: Secondary | ICD-10-CM | POA: Diagnosis not present

## 2019-12-23 DIAGNOSIS — F5105 Insomnia due to other mental disorder: Secondary | ICD-10-CM | POA: Diagnosis not present

## 2019-12-23 DIAGNOSIS — F411 Generalized anxiety disorder: Secondary | ICD-10-CM

## 2019-12-23 MED ORDER — QUETIAPINE FUMARATE 50 MG PO TABS: 50.0000 mg | ORAL_TABLET | Freq: Two times a day (BID) | ORAL | 2 refills | Status: DC

## 2019-12-23 MED ORDER — TRAZODONE HCL 100 MG PO TABS: 100.0000 mg | ORAL_TABLET | Freq: Every day | ORAL | 2 refills | Status: DC

## 2019-12-23 MED ORDER — HYDROXYZINE HCL 25 MG PO TABS: 25.0000 mg | ORAL_TABLET | Freq: Three times a day (TID) | ORAL | 1 refills | Status: DC | PRN
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
## 2019-12-30 ENCOUNTER — Encounter: Payer: Self-pay | Admitting: Orthopedic Surgery

## 2019-12-30 NOTE — Progress Notes (Signed)
Office Visit Note   Patient: Dylan [name]           Date of Birth: Apr 08, 1965           MRN: [id number] Visit Date: 12/09/2019              Requested by: Charlott Rakes, MD [place],  [place] [id number] PCP: Charlott Rakes, MD  Chief Complaint  Patient presents with  . Right Foot - Routine Post Op    09/17/19 Lisfranc fusion   . Left Knee - Pain      HPI: Patient is a 54 year old gentleman who presents in follow-up about 3 months out from Lisfranc fracture dislocation fusion on 09/17/2019.  Patient complains of pain in the ankle and toes not specifically at the fusion site.  Patient is full weightbearing in regular shoes has pain with prolonged activity swelling with increased activities and has medial sided numbness to his foot.  Patient also complains of left knee pain with medial sided pain stiffness on and off states he has episodes of giving way with stiffness after prolonged rest.  Patient states he has tried injections of the left knee without relief.  Assessment & Plan: Visit Diagnoses:  1. Acute pain of left knee   2. Lisfranc dislocation, right, initial encounter     Plan: Patient has reached his maximal medical improvement for the Lisfranc fracture dislocation.  In review of the [place] industrial guidelines for permanent partial impairment patient's permanent partial impairment of the foot would be a total of 20%.  This is a combination of the impairment of the foot and ankle which is 10% impairment of the foot for decreased subtalar motion of 20 degrees inversion and eversion, and 10% disability ankle for range of motion from 90 to 120 degrees.  Follow-Up Instructions: No follow-ups on file.   Ortho Exam  Patient is alert, oriented, no adenopathy, well-dressed, normal affect, normal respiratory effort. Examination patient has varus alignment to left knee with pain to palpation of the medial joint line crepitation with range of motion  collateral cruciates are stable.  Radiograph shows bony spurs in all 3 compartments.  Patient has stiffness of the right ankle with dorsiflexion only to neutral he has subtalar motion of 20 degrees.  He has pain to palpation beneath the metatarsal heads secondary to the Achilles tightness and Achilles stretching was demonstrated and recommended.  Imaging: No results found. No images are attached to the encounter.  Labs: Lab Results  Component Value Date   HGBA1C 5.9 (H) 11/09/2019   HGBA1C 6.0 (H) 07/11/2019   HGBA1C 6.1 (H) 02/13/2019   REPTSTATUS 03/03/2017 FINAL 02/26/2017   CULT  02/26/2017    NO GROWTH 5 DAYS Performed at [facility], [place] [address] [place], [place] [address]      Lab Results  Component Value Date   ALBUMIN 4.5 11/09/2019   ALBUMIN 4.0 09/17/2019   ALBUMIN 4.5 07/11/2019    Lab Results  Component Value Date   MG 2.8 (H) 02/20/2017   No results found for: VD25OH  No results found for: PREALBUMIN CBC EXTENDED Latest Ref Rng & Units 11/09/2019 09/17/2019 07/11/2019  WBC 4.0 - 10.5 K/uL 10.7(H) 5.7 10.6(H)  RBC 4.22 - 5.81 MIL/uL 4.67 5.18 4.83  HGB 13.0 - 17.0 g/dL 14.7 16.1 14.9  HCT 39.0 - 52.0 % 43.5 48.7 44.7  PLT 150 - 400 K/uL 202 178 180  NEUTROABS 1.7 - 7.7 K/uL 7.1 -  8.2(H)  LYMPHSABS 0.7 - 4.0 K/uL 2.3 - 1.8     There is no height or weight on file to calculate BMI.  Orders:  Orders Placed This Encounter  Procedures  . XR Knee 1-2 Views Left  . XR Foot Complete Right   No orders of the defined types were placed in this encounter.    Procedures: No procedures performed  Clinical Data: No additional findings.  ROS:  All other systems negative, except as noted in the HPI. Review of Systems  Objective: Vital Signs: There were no vitals taken for this visit.  Specialty Comments:  No specialty comments available.  PMFS History: Patient Active Problem List   Diagnosis Date Noted  . Lisfranc dislocation, right,  initial encounter   . Prediabetes 07/22/2019  . Tobacco abuse 07/10/2018  . ESRD needing dialysis ([place]) 03/03/2018  . Cocaine abuse with cocaine-induced mood disorder ([place]) 05/18/2017  . Schizoaffective disorder, bipolar type ([place])   . Acute renal failure (ARF) ([place])   . Suicidal ideations   . Chronic 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 coma ([place])   . AKI (acute kidney injury) ([place]) 02/19/2017  . Rhabdomyolysis 02/19/2017  . Suicidal overdose ([place]) 02/19/2017  . Acute hepatitis 02/19/2017  . Elevated LFTs   . Homicidal ideations   . Schizoaffective disorder, depressive type ([place]) 09/05/2011  . Cocaine abuse with cocaine-induced psychotic disorder ([place]) 09/05/2011  . Degenerative disc disease 08/21/2011  . HIV disease ([place]) 08/17/2011   Past Medical History:  Diagnosis Date  . Anxiety   . Arthritis   . Bipolar 1 disorder ([place])   . Colon polyps   . Depression   . GERD (gastroesophageal reflux disease)   . Hepatitis B   . Human immunodeficiency virus (HIV) ([place])   . Hyperlipidemia   . Hypertension   . Neuromuscular disorder (HCC)    neuropathy  . Neuropathy   . Pre-diabetes   . Schizophrenia ([place])     Family History  Problem Relation Age of Onset  . CAD Mother   . Diabetes Father   . Colon cancer Maternal Aunt 50  . Diabetes Maternal Aunt   . Heart disease Maternal Uncle   . Esophageal cancer Neg Hx   . Rectal cancer Neg Hx   . Stomach cancer Neg Hx     Past Surgical History:  Procedure Laterality Date  . FOOT ARTHRODESIS Right 09/17/2019   Procedure: FUSION RIGHT LISFRANC JOINT;  Surgeon: Newt Minion, MD;  Location: [facility];  Service: Orthopedics;  Laterality: Right;  . HEMORROIDECTOMY    . IR FLUORO GUIDE CV LINE RIGHT  02/22/2017  . IR US GUIDE VASC ACCESS RIGHT  02/22/2017  . TUMOR REMOVAL     From Chest   Social History   Occupational History  . Not on file  Tobacco Use  . Smoking status: Current Every Day Smoker    Packs/day: 0.50     Years: 15.00    Pack years: 7.50    Types: Cigarettes  . Smokeless tobacco: Never Used  . Tobacco comment: has patches when he is ready to quit  Vaping Use  . Vaping Use: Never used  Substance and Sexual Activity  . Alcohol use: Yes    Comment: occassional use on weekends  . Drug use: Not Currently    Types: Cocaine, Marijuana    Comment: last used cocaine and marijuana "years ago"  . Sexual activity: Yes    Partners: Female    Comment:  condoms given

## 2020-01-02 ENCOUNTER — Other Ambulatory Visit: Payer: Self-pay | Admitting: Internal Medicine

## 2020-01-05 ENCOUNTER — Telehealth: Payer: Self-pay

## 2020-01-05 NOTE — Telephone Encounter (Signed)
Left patient a voice mail to call back to reschedule an appointment with Dr. Baxter Flattery

## 2020-01-06 ENCOUNTER — Ambulatory Visit (HOSPITAL_COMMUNITY): Payer: Medicaid Other | Admitting: Behavioral Health

## 2020-01-15 ENCOUNTER — Encounter: Payer: Self-pay | Admitting: Orthopedic Surgery

## 2020-01-15 ENCOUNTER — Encounter: Payer: Self-pay | Admitting: Physician Assistant

## 2020-01-15 ENCOUNTER — Ambulatory Visit (INDEPENDENT_AMBULATORY_CARE_PROVIDER_SITE_OTHER): Payer: Medicaid Other | Admitting: Physician Assistant

## 2020-01-15 ENCOUNTER — Other Ambulatory Visit: Payer: Self-pay

## 2020-01-15 DIAGNOSIS — M25562 Pain in left knee: Secondary | ICD-10-CM

## 2020-01-15 MED ORDER — MELOXICAM 7.5 MG PO TABS: 7.5000 mg | ORAL_TABLET | Freq: Every day | ORAL | 1 refills | Status: DC

## 2020-01-15 MED ORDER — METHYLPREDNISOLONE ACETATE 40 MG/ML IJ SUSP
40.0000 mg | INTRAMUSCULAR | Status: AC | PRN
  Administered 2020-01-15: 40 mg via INTRA_ARTICULAR

## 2020-01-15 MED ORDER — LIDOCAINE HCL 1 % IJ SOLN
5.0000 mL | INTRAMUSCULAR | Status: AC | PRN
  Administered 2020-01-15: 5 mL

## 2020-01-15 NOTE — Progress Notes (Signed)
Office Visit Note   Patient: Dylan [name]           Date of Birth: 12-27-1965           MRN: [id number] Visit Date: 01/15/2020              Requested by: Charlott Rakes, MD [place],  [place] [address] PCP: Charlott Rakes, MD  Chief Complaint  Patient presents with  . Right Foot - Follow-up    09/17/19 Fusion right Lisfranc  . Left Knee - Pain      HPI:  Patient is a pleasant 55 year old gentleman with a history of an ORIF work-related injury to his midfoot on the right.  He also has some chronic pain on the left knee.  He has been told in the past that he needs a knee replacement and that he has bone spurs.  He has returned to work and is on his feet for 8 hours a day.  He has not yet gotten his orthotic for the right foot Assessment & Plan: Visit Diagnoses: No diagnosis found.  Plan: We could try another injection in his knee and this is what he would like to do.  I would like for him to try working with his orthotic.  He will follow-up in 1 month. We will try him on a course of Mobic.  He we have told him he is not to take any other anti-inflammatories while he is taking this Follow-Up Instructions: No follow-ups on file.   Ortho Exam  Patient is alert, oriented, no adenopathy, well-dressed, normal affect, normal respiratory effort. Left knee he does have some varus malalignment.  No effusion no erythema no swelling he is tender on the medial lateral joint line. Right foot well-healed surgical incisions overall well-maintained arch he has minimal tenderness to palpation today.  Imaging: No results found. No images are attached to the encounter.  Labs: Lab Results  Component Value Date   HGBA1C 5.9 (H) 11/09/2019   HGBA1C 6.0 (H) 07/11/2019   HGBA1C 6.1 (H) 02/13/2019   REPTSTATUS 03/03/2017 FINAL 02/26/2017   CULT  02/26/2017    NO GROWTH 5 DAYS Performed at [facility], [place] [address] [place], [place] [address]      Lab Results   Component Value Date   ALBUMIN 4.5 11/09/2019   ALBUMIN 4.0 09/17/2019   ALBUMIN 4.5 07/11/2019    Lab Results  Component Value Date   MG 2.8 (H) 02/20/2017   No results found for: VD25OH  No results found for: PREALBUMIN CBC EXTENDED Latest Ref Rng & Units 11/09/2019 09/17/2019 07/11/2019  WBC 4.0 - 10.5 K/uL 10.7(H) 5.7 10.6(H)  RBC 4.22 - 5.81 MIL/uL 4.67 5.18 4.83  HGB 13.0 - 17.0 g/dL 14.7 16.1 14.9  HCT 39.0 - 52.0 % 43.5 48.7 44.7  PLT 150 - 400 K/uL 202 178 180  NEUTROABS 1.7 - 7.7 K/uL 7.1 - 8.2(H)  LYMPHSABS 0.7 - 4.0 K/uL 2.3 - 1.8     There is no height or weight on file to calculate BMI.  Orders:  No orders of the defined types were placed in this encounter.  No orders of the defined types were placed in this encounter.    Procedures: Large Joint Inj on 01/15/2020 1:32 PM Indications: pain and diagnostic evaluation Details: 22 G 1.5 in needle, anteromedial approach  Arthrogram: No  Medications: 40 mg methylPREDNISolone acetate 40 MG/ML; 5 mL lidocaine 1 % Outcome: tolerated well, no immediate  complications Procedure, treatment alternatives, risks and benefits explained, specific risks discussed. Consent was given by the patient.      Clinical Data: No additional findings.  ROS:  All other systems negative, except as noted in the HPI. Review of Systems  Objective: Vital Signs: There were no vitals taken for this visit.  Specialty Comments:  No specialty comments available.  PMFS History: Patient Active Problem List   Diagnosis Date Noted  . Lisfranc dislocation, right, initial encounter   . Prediabetes 07/22/2019  . Tobacco abuse 07/10/2018  . ESRD needing dialysis ([place]) 03/03/2018  . Cocaine abuse with cocaine-induced mood disorder ([place]) 05/18/2017  . Schizoaffective disorder, bipolar type ([place])   . Acute renal failure (ARF) ([place])   . Suicidal ideations   . Chronic 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 coma ([place])   . AKI  (acute kidney injury) ([place]) 02/19/2017  . Rhabdomyolysis 02/19/2017  . Suicidal overdose ([place]) 02/19/2017  . Acute hepatitis 02/19/2017  . Elevated LFTs   . Homicidal ideations   . Schizoaffective disorder, depressive type ([place]) 09/05/2011  . 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 Degenerative disc disease 08/21/2011  . HIV disease ([place]) 08/17/2011   Past Medical History:  Diagnosis Date  . Anxiety   . Arthritis   . Bipolar 1 disorder ([place])   . Colon polyps   . Depression   . GERD (gastroesophageal reflux disease)   . Hepatitis B   . Human immunodeficiency virus (HIV) ([place])   . Hyperlipidemia   . Hypertension   . Neuromuscular disorder (HCC)    neuropathy  . Neuropathy   . Pre-diabetes   . Schizophrenia ([place])     Family History  Problem Relation Age of Onset  . CAD Mother   . Diabetes Father   . Colon cancer Maternal Aunt 67  . Diabetes Maternal Aunt   . Heart disease Maternal Uncle   . Esophageal cancer Neg Hx   . Rectal cancer Neg Hx   . Stomach cancer Neg Hx     Past Surgical History:  Procedure Laterality Date  . FOOT ARTHRODESIS Right 09/17/2019   Procedure: FUSION RIGHT LISFRANC JOINT;  Surgeon: Newt Minion, MD;  Location: [facility];  Service: Orthopedics;  Laterality: Right;  . HEMORROIDECTOMY    . IR FLUORO GUIDE CV LINE RIGHT  02/22/2017  . IR US GUIDE VASC ACCESS RIGHT  02/22/2017  . TUMOR REMOVAL     From Chest   Social History   Occupational History  . Not on file  Tobacco Use  . Smoking status: Current Every Day Smoker    Packs/day: 0.50    Years: 15.00    Pack years: 7.50    Types: Cigarettes  . Smokeless tobacco: Never Used  . Tobacco comment: has patches when he is ready to quit  Vaping Use  . Vaping Use: Never used  Substance and Sexual Activity  . Alcohol use: Yes    Comment: occassional use on weekends  . Drug use: Not Currently    Types: Cocaine, Marijuana    Comment: last used cocaine and marijuana  "years ago"  . Sexual activity: Yes    Partners: Female    Comment: condoms given

## 2020-01-28 ENCOUNTER — Telehealth: Payer: Self-pay | Admitting: Orthopedic Surgery

## 2020-01-28 NOTE — Telephone Encounter (Signed)
Rx written holding for signature tomorrow will call pt to advise.

## 2020-01-28 NOTE — Telephone Encounter (Signed)
Patient called advised he lost the Rx he was given for [facility]. Patient asked if he can get another Rx and he will pick it up. The number to contact patient is [phone]

## 2020-02-03 ENCOUNTER — Encounter (HOSPITAL_COMMUNITY): Payer: Self-pay | Admitting: Physician Assistant

## 2020-02-03 ENCOUNTER — Telehealth (INDEPENDENT_AMBULATORY_CARE_PROVIDER_SITE_OTHER): Payer: Medicaid Other | Admitting: Physician Assistant

## 2020-02-03 ENCOUNTER — Other Ambulatory Visit: Payer: Self-pay

## 2020-02-03 DIAGNOSIS — F5105 Insomnia due to other mental disorder: Secondary | ICD-10-CM

## 2020-02-03 DIAGNOSIS — F411 Generalized anxiety disorder: Secondary | ICD-10-CM | POA: Insufficient documentation

## 2020-02-03 DIAGNOSIS — F99 Mental disorder, not otherwise specified: Secondary | ICD-10-CM | POA: Diagnosis not present

## 2020-02-03 DIAGNOSIS — F250 Schizoaffective disorder, bipolar type: Secondary | ICD-10-CM | POA: Diagnosis not present

## 2020-02-03 HISTORY — DX: Insomnia due to other mental disorder: F51.05

## 2020-02-03 MED ORDER — TRAZODONE HCL 100 MG PO TABS: 100.0000 mg | ORAL_TABLET | Freq: Every day | ORAL | 2 refills | Status: DC

## 2020-02-03 MED ORDER — QUETIAPINE FUMARATE 50 MG PO TABS: 50.0000 mg | ORAL_TABLET | Freq: Two times a day (BID) | ORAL | 2 refills | Status: DC

## 2020-02-03 NOTE — Progress Notes (Signed)
[facility] MD/PA/NP OP Progress Note  Virtual Visit via Telephone Note  I connected with Dylan [name] on 02/03/20 at  2:00 PM EST by telephone and verified that I am speaking with the correct person using two identifiers.  Location: Patient: Home Provider: Clinic   I discussed the limitations, risks, security and privacy concerns of performing an evaluation and management service by telephone and the availability of in person appointments. I also discussed with the patient that there may be a patient responsible charge related to this service. The patient expressed understanding and agreed to proceed.  Follow Up Instructions:    I discussed the assessment and treatment plan with the patient. The patient was provided an opportunity to ask questions and all were answered. The patient agreed with the plan and demonstrated an understanding of the instructions.   The patient was advised to call back or seek an in-person evaluation if the symptoms worsen or if the condition fails to improve as anticipated.  I provided 23 minutes of non-face-to-face time during this encounter.   Dylan Mood, PA   02/03/2020 4:48 PM Dylan [name]  MRN:  [id number]  Chief Complaint: Follow-up and medication management  HPI:   Dylan Smeby. [name] is a 55 year old male with a past psychiatric history significant for schizoaffective bipolar disorder, insomnia, and generalized anxiety disorder who presents to [facility] via virtual telephone visit for follow-up and medication management.  Patient is currently being managed on the following medications:  Quetiapine 50 mg 2 times daily, once in the morning/once at night Trazodone 100 mg at bedtime Hydroxyzine 25 mg 3 times daily as needed  Patient has no issues or concerns with his current regimen of medications.  Patient denies the need for dosage adjustments at this time. Patient is requesting refills on his Quetiapine  and trazodone after the conclusion of the encounter. Patient has no other concerns today.  Patient is pleasant, calm, cooperative, and fully engaged in conversation during the encounter.  Patient states that his [name] occasionally has its ups and downs but for the most part, his [name] has been mostly steady.  Patient reports experiencing paranoia when around people at times but he is still able to get his responsibilities completed.  Patient denies suicidal or homicidal ideations.  He denies current auditory or visual hallucinations, however, he endorses having auditory hallucinations occasionally in the morning or at night.  Whenever he experiences auditory hallucinations, patient states that he will either stay in one spot in the apartment until the voices stop or he will pick up a book and read.    Patient endorses good sleep and receives on average 8 hours of sleep a day.  Patient endorses poor appetite but manages to have 2 meals a day with snacking in between.  Patient denies current alcohol use and has been off alcohol for roughly a month.  Patient shows interest in being a part of an alcohol outpatient treatment program.  Patient denies tobacco use but states that he has been vaping lately.  Patient reports that he recently relapsed on cocaine a month and a half ago. Patient states that he relapsed on cocaine due to the voices comparing him to do cocaine. Patient would like to get in contact with his therapist to include his name and the outpatient treatment program.  Visit Diagnosis:    ICD-10-CM   1. Schizoaffective disorder, bipolar type ([place])  F25.0   2. Generalized anxiety disorder  F41.1  3. Insomnia due to other mental disorder  F51.05    F99     Past Psychiatric History:  Schizoaffective disorder, bipolar type Anxiety Insomnia  Past Medical History:  Past Medical History:  Diagnosis Date  . Anxiety   . Arthritis   . Bipolar 1 disorder ([place])   . Colon polyps   . Depression    . GERD (gastroesophageal reflux disease)   . Hepatitis B   . Human immunodeficiency virus (HIV) ([place])   . Hyperlipidemia   . Hypertension   . Neuromuscular disorder (HCC)    neuropathy  . Neuropathy   . Pre-diabetes   . Schizophrenia [facility])     Past Surgical History:  Procedure Laterality Date  . FOOT ARTHRODESIS Right 09/17/2019   Procedure: FUSION RIGHT LISFRANC JOINT;  Surgeon: Newt Minion, MD;  Location: [facility];  Service: Orthopedics;  Laterality: Right;  . HEMORROIDECTOMY    . IR FLUORO GUIDE CV LINE RIGHT  02/22/2017  . IR US GUIDE VASC ACCESS RIGHT  02/22/2017  . TUMOR REMOVAL     From Chest    Family Psychiatric History:  Mother - patient is unsure but he states she does talk to herself a lot Uncle (maternal side) - Talks to himself and prefers to live on the street  Family History:  Family History  Problem Relation Age of Onset  . CAD Mother   . Diabetes Father   . Colon cancer Maternal Aunt 77  . Diabetes Maternal Aunt   . Heart disease Maternal Uncle   . Esophageal cancer Neg Hx   . Rectal cancer Neg Hx   . Stomach cancer Neg Hx     Social History:  Social History   Socioeconomic History  . Marital status: Married    Spouse name: Not on file  . Number of children: 1  . Years of education: Not on file  . Highest education level: Not on file  Occupational History  . Not on file  Tobacco Use  . Smoking status: Current Every Day Smoker    Packs/day: 0.50    Years: 15.00    Pack years: 7.50    Types: Cigarettes  . Smokeless tobacco: Never Used  . Tobacco comment: has patches when he is ready to quit  Vaping Use  . Vaping Use: Never used  Substance and Sexual Activity  . Alcohol use: Yes    Comment: occassional use on weekends  . Drug use: Not Currently    Types: Cocaine, Marijuana    Comment: last used cocaine and marijuana "years ago"  . Sexual activity: Yes    Partners: Female    Comment: condoms given  Other Topics Concern  . Not on file   Social History Narrative  . Not on file   Social Determinants of Health   Financial Resource Strain: Not on file  Food Insecurity: Not on file  Transportation Needs: Not on file  Physical Activity: Not on file  Stress: Not on file  Social Connections: Not on file    Allergies:  Allergies  Allergen Reactions  . Penicillins Other (See Comments)    Childhood allergy  . Latex Rash  . Tape Rash    Metabolic Disorder Labs: Lab Results  Component Value Date   HGBA1C 5.9 (H) 11/09/2019   MPG 122.63 11/09/2019   MPG 125.5 07/11/2019   Lab Results  Component Value Date   PROLACTIN 2.6 (L) 11/09/2019   Lab Results  Component Value Date   CHOL  211 (H) 11/09/2019   TRIG 92 11/09/2019   HDL 47 11/09/2019   CHOLHDL 4.5 11/09/2019   VLDL 18 11/09/2019   LDLCALC 146 (H) 11/09/2019   LDLCALC 136 (H) 07/11/2019   Lab Results  Component Value Date   TSH 0.931 11/09/2019   TSH 0.910 07/11/2019    Therapeutic Level Labs: No results found for: LITHIUM No results found for: VALPROATE No components found for:  CBMZ  Current Medications: Current Outpatient Medications  Medication Sig Dispense Refill  . BIKTARVY 50-200-25 MG TABS tablet TAKE 1 TABLET BY MOUTH DAILY 30 tablet 0  . buPROPion (WELLBUTRIN XL) 150 MG 24 hr tablet Take 1 tablet (150 mg total) by mouth daily. 30 tablet 3  . gabapentin (NEURONTIN) 300 MG capsule Take 2 capsules (600 mg total) by mouth 2 (two) times daily. 120 capsule 3  . hydrOXYzine (ATARAX/VISTARIL) 25 MG tablet Take 1 tablet (25 mg total) by mouth 3 (three) times daily as needed for anxiety. 90 tablet 1  . meloxicam (MOBIC) 7.5 MG tablet Take 1 tablet (7.5 mg total) by mouth daily. 30 tablet 1  . QUEtiapine (SEROQUEL) 50 MG tablet Take 1 tablet (50 mg total) by mouth 2 (two) times daily. 60 tablet 2  . rosuvastatin (CRESTOR) 20 MG tablet Take 1 tablet (20 mg total) by mouth daily. 30 tablet 3  . traZODone (DESYREL) 100 MG tablet Take 1 tablet (100  mg total) by mouth at bedtime. 30 tablet 2   No current facility-administered medications for this visit.     Musculoskeletal: Strength & Muscle Tone: Unable to assess due to telemedicine visit [place]: Unable to assess due to telemedicine visit Patient leans:  Unable to assess due to telemedicine visit  Psychiatric Specialty Exam: Review of Systems  Psychiatric/Behavioral: Negative for confusion, decreased concentration, dysphoric [name], hallucinations (Patient denies current hallucinations but he does endorse some auditory hallucinations that occur either in the morning or night. Patient states that his auditory hallucinations can be command in nature.), sleep disturbance and suicidal ideas. The patient is not nervous/anxious and is not hyperactive.     There were no vitals taken for this visit.There is no height or weight on file to calculate BMI.  General Appearance: Unable to assess due to telemedicine visit  Eye Contact:  Unable to assess due to telemedicine visit  Speech:  Clear and Coherent and Normal Rate  Volume:  Normal  [name]:  Euthymic  Affect:  Appropriate  Thought Process:  Coherent and Descriptions of Associations: Intact  Orientation:  Full (Time, Place, and Person)  Thought Content: WDL   Suicidal Thoughts:  No  Homicidal Thoughts:  No  Memory:  Immediate;   Good Recent;   Good Remote;   Good  Judgement:  Good  Insight:  Good  Psychomotor Activity:  Normal  Concentration:  Concentration: Good and Attention Span: Good  Recall:  Good  Fund of Knowledge: Good  Language: Good  Akathisia:  NA  Handed:  Right  AIMS (if indicated): not done  Assets:  Communication Skills Desire for Improvement Housing Intimacy Social Support Vocational/Educational  ADL's:  Intact  Cognition: WNL  Sleep:  Good   Screenings: AUDIT   Flowsheet Row Admission (Discharged) from 12/12/2011 in [facility]  Alcohol Use Disorder  Identification Test Final Score (AUDIT) 8    GAD-7   Flowsheet Row Office Visit from 07/22/2019 in [facility] Office Visit from 09/03/2018 in [facility]  Total GAD-7 Score 19 19    PHQ2-9   Flowsheet Row ED from 11/09/2019 in [facility] Office Visit from 07/22/2019 in [facility] Office Visit from 09/23/2018 in [facility] for [facility]ious Disease Office Visit from 09/03/2018 in [facility] Office Visit from 06/20/2018 in [facility] for Infectious Disease  PHQ-2 Total Score '6 4 1 4 1  '$ PHQ-9 Total Score 19 15 - 15 -       Assessment and Plan:  Quillian Quince. Diclemente is a 55 year old male with a past psychiatric history significant for schizoaffective bipolar disorder, insomnia, and generalized anxiety disorder who presents to [facility] via virtual telephone visit for follow-up and medication management. Patient has no issues or concerns with his current regimen of medications.  Patient denies the need for dosage adjustments at this time.  Patient is requesting refills on his quetiapine and trazodone.  Patient's medications will be e-prescribed to pharmacy of choice.  Patient states that he would like to include himself in the outpatient treatment program.  Patient will be scheduled an appointment with his clinical social worker, Daymon Larsen, LCSW.  1. Schizoaffective disorder, bipolar type (HCC)  - QUEtiapine (SEROQUEL) 50 MG tablet; Take 1 tablet (50 mg total) by mouth 2 (two) times daily.  Dispense: 60 tablet; Refill: 2  2. Generalized anxiety disorder Patient to continue taking hydroxyzine 25 mg 3 times daily as needed for the management of his anxiety  3. Insomnia due to other mental disorder  - traZODone (DESYREL) 100 MG tablet; Take 1 tablet (100 mg total) by mouth at  bedtime.  Dispense: 30 tablet; Refill: 2  Patient to follow up in 6-weeks  Dylan Mood, PA 02/03/2020, 4:48 PM

## 2020-02-05 ENCOUNTER — Other Ambulatory Visit: Payer: Medicaid Other

## 2020-02-06 ENCOUNTER — Other Ambulatory Visit: Payer: Self-pay | Admitting: Internal Medicine

## 2020-02-07 ENCOUNTER — Encounter: Payer: Self-pay | Admitting: Family Medicine

## 2020-02-09 ENCOUNTER — Other Ambulatory Visit: Payer: Self-pay | Admitting: Family Medicine

## 2020-02-09 MED ORDER — OMEPRAZOLE 20 MG PO CPDR: 20.0000 mg | DELAYED_RELEASE_CAPSULE | Freq: Every day | ORAL | 3 refills | Status: DC

## 2020-02-12 ENCOUNTER — Other Ambulatory Visit: Payer: Self-pay

## 2020-02-12 ENCOUNTER — Ambulatory Visit (HOSPITAL_COMMUNITY): Payer: Medicaid Other | Admitting: Behavioral Health

## 2020-02-12 DIAGNOSIS — F1414 Cocaine abuse with cocaine-induced mood disorder: Secondary | ICD-10-CM

## 2020-02-19 ENCOUNTER — Ambulatory Visit: Payer: Medicaid Other | Admitting: Internal Medicine

## 2020-02-26 ENCOUNTER — Ambulatory Visit (HOSPITAL_COMMUNITY): Payer: Medicaid Other | Admitting: Behavioral Health

## 2020-02-26 ENCOUNTER — Other Ambulatory Visit: Payer: Self-pay

## 2020-02-26 DIAGNOSIS — B20 Human immunodeficiency virus [HIV] disease: Secondary | ICD-10-CM

## 2020-02-26 DIAGNOSIS — Z113 Encounter for screening for infections with a predominantly sexual mode of transmission: Secondary | ICD-10-CM

## 2020-02-29 ENCOUNTER — Other Ambulatory Visit: Payer: Self-pay | Admitting: Internal Medicine

## 2020-02-29 DIAGNOSIS — B20 Human immunodeficiency virus [HIV] disease: Secondary | ICD-10-CM

## 2020-03-01 ENCOUNTER — Other Ambulatory Visit: Payer: Self-pay

## 2020-03-01 ENCOUNTER — Encounter: Payer: Self-pay | Admitting: *Deleted

## 2020-03-06 ENCOUNTER — Ambulatory Visit (HOSPITAL_COMMUNITY)
Admission: EM | Admit: 2020-03-06 | Discharge: 2020-03-06 | Disposition: A | Discharge status: Home / Self Care | Payer: Medicaid Other | Attending: Emergency Medicine | Admitting: Emergency Medicine

## 2020-03-06 ENCOUNTER — Other Ambulatory Visit: Payer: Self-pay

## 2020-03-06 ENCOUNTER — Encounter (HOSPITAL_COMMUNITY): Payer: Self-pay

## 2020-03-06 ENCOUNTER — Telehealth (HOSPITAL_COMMUNITY): Payer: Self-pay | Admitting: Behavioral Health

## 2020-03-06 DIAGNOSIS — M545 Low back pain, unspecified: Secondary | ICD-10-CM

## 2020-03-06 LAB — POCT URINALYSIS DIPSTICK, ED / UC
Bilirubin Urine: NEGATIVE
Glucose, UA: NEGATIVE mg/dL
Hgb urine dipstick: NEGATIVE
Ketones, ur: NEGATIVE mg/dL
Leukocytes,Ua: NEGATIVE
Nitrite: NEGATIVE
Protein, ur: NEGATIVE mg/dL
Specific Gravity, Urine: 1.025 (ref 1.005–1.030)
Urobilinogen, UA: 0.2 mg/dL (ref 0.0–1.0)
pH: 6 (ref 5.0–8.0)

## 2020-03-06 MED ORDER — METHOCARBAMOL 500 MG PO TABS: 500.0000 mg | ORAL_TABLET | Freq: Two times a day (BID) | ORAL | 0 refills | Status: DC | PRN

## 2020-03-06 NOTE — Discharge Instructions (Addendum)
Take the muscle relaxer as needed for muscle spasm; Do not drive, operate machinery, or drink alcohol with this medication as it can cause drowsiness.   Follow up with your primary care provider or an orthopedist if your symptoms are not improving.     

## 2020-03-06 NOTE — ED Triage Notes (Signed)
Pt reports left sided lower back pain x 3 days. States he is worry as he had kidney failure and was in dialisys x 1 month (2019).Denies dysuria, increased urinary frequency, fever, chills.
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
## 2020-03-06 NOTE — Progress Notes (Signed)
Dylan [name] is a 55 y.o. male patient who was scheduled to present today for a virtual f/u appointment with this Probation officer. Clt was unable to access the virtual video link; therefore, a telephone call visit was conducted between client and this Probation officer. Clt reported to this writer that he used "$1,000.00 worth of cocaine yesterday and drank a small bottle of [place]". He reports having an increased heart rate and reports he "blacked out last night". Therapist urged clt to present to a local ER for medical clearance. He denied having any current physical withdrawal symptoms; however, ct agreed to present to he ER [place]).   Therapist informed clt that she would follow-up with client at [phone] in 2 hours to assess for safety - clt accepted.  Virtual Visit via Telephone Note  I connected with Dylan [name] on 03/06/20 at 10:00 AM EST by telephone and verified that I am speaking with the correct person using two identifiers.  Location: Patient: [name], [name] Provider: Clinical Home - [facility]   I discussed the limitations, risks, security and privacy concerns of performing an evaluation and management service by telephone and the availability of in person appointments. I also discussed with the patient that there may be a patient responsible charge related to this service. The patient expressed understanding and agreed to proceed.  I discussed the assessment and treatment plan with the patient. The patient was provided an opportunity to ask questions and all were answered. The patient agreed with the plan and demonstrated an understanding of the instructions.   The patient was advised to call back or seek an in-person evaluation if the symptoms worsen or if the condition fails to improve as anticipated.  I provided 30 minutes of non-face-to-face time during this encounter.   Meshach Perry S Damoney Julia, Counselor        Allyne Gee, Counselor

## 2020-03-06 NOTE — ED Provider Notes (Signed)
[place]    CSN: [id number] Arrival date & time: 03/06/20  1100      History   Chief Complaint Chief Complaint  Patient presents with  . Back Pain    HPI Dylan [name] is a 55 y.o. male.  Patient presents with left lower back pain x3 days. No falls or injury. He would like to have his urine checked as he has history of kidney failure. He denies fever, chills, dysuria, hematuria, rash, redness, bruising, numbness, weakness, saddle anesthesia, loss of bowel/bladder control, or other symptoms. His medical history includes schizoaffective disorder, cocaine abuse, suicidal overdose, homicidal ideation, tobacco abuse, anxiety, bipolar 1 disorder, HIV, DDD, rhabdomyolysis, hepatitis, ESRD needing dialysis.  The history is provided by the patient and medical records.    Past Medical History:  Diagnosis Date  . Anxiety   . Arthritis   . Bipolar 1 disorder ([place])   . Colon polyps   . Depression   . GERD (gastroesophageal reflux disease)   . Hepatitis B   . Human immunodeficiency virus (HIV) ([place])   . Hyperlipidemia   . Hypertension   . Neuromuscular disorder (HCC)    neuropathy  . Neuropathy   . Pre-diabetes   . Schizophrenia [facility])     Patient Active Problem List   Diagnosis Date Noted  . Generalized anxiety disorder 02/03/2020  . Insomnia due to other mental disorder 02/03/2020  . Lisfranc dislocation, right, initial encounter   . Prediabetes 07/22/2019  . Tobacco abuse 07/10/2018  . ESRD needing dialysis ([place]) 03/03/2018  . Cocaine abuse with cocaine-induced mood disorder ([place]) 05/18/2017  . Schizoaffective disorder, bipolar type ([place])   . Acute renal failure (ARF) ([place])   . Suicidal ideations   . Chronic 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 coma ([place])   . AKI (acute kidney injury) ([place]) 02/19/2017  . Rhabdomyolysis 02/19/2017  . Suicidal overdose ([place]) 02/19/2017  . Acute hepatitis 02/19/2017  . Elevated LFTs   . Homicidal ideations   .  Schizoaffective disorder, depressive type ([place]) 09/05/2011  . 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 Degenerative disc disease 08/21/2011  . HIV disease ([place]) 08/17/2011    Past Surgical History:  Procedure Laterality Date  . FOOT ARTHRODESIS Right 09/17/2019   Procedure: FUSION RIGHT LISFRANC JOINT;  Surgeon: Newt Minion, MD;  Location: [place] [facility];  Service: Orthopedics;  Laterality: Right;  . HEMORROIDECTOMY    . IR FLUORO GUIDE CV LINE RIGHT  02/22/2017  . IR US GUIDE VASC ACCESS RIGHT  02/22/2017  . TUMOR REMOVAL     From Chest       Home Medications    Prior to Admission medications   Medication Sig Start Date End Date Taking? Authorizing Provider  methocarbamol (ROBAXIN) 500 MG tablet Take 1 tablet (500 mg total) by mouth 2 (two) times daily as needed for muscle spasms. 03/06/20  Yes Sharion Balloon, NP  BIKTARVY 50-200-25 MG TABS tablet TAKE 1 TABLET BY MOUTH DAILY 03/01/20   Carlyle Basques, MD  buPROPion (WELLBUTRIN XL) 150 MG 24 hr tablet Take 1 tablet (150 mg total) by mouth daily. 11/19/19   Charlott Rakes, MD  gabapentin (NEURONTIN) 300 MG capsule Take 2 capsules (600 mg total) by mouth 2 (two) times daily. 11/19/19   Charlott Rakes, MD  hydrOXYzine (ATARAX/VISTARIL) 25 MG tablet Take 1 tablet (25 mg total) by mouth 3 (three) times daily as needed for anxiety. 12/23/19   [name], PA  meloxicam (MOBIC) 7.5 MG tablet Take 1 tablet (7.5 mg total) by mouth daily. 01/15/20   Persons, Bevely Palmer, PA  omeprazole (PRILOSEC) 20 MG capsule Take 1 capsule (20 mg total) by mouth daily. 02/09/20   Charlott Rakes, MD  QUEtiapine (SEROQUEL) 50 MG tablet Take 1 tablet (50 mg total) by mouth 2 (two) times daily. 02/03/20   Nwoko, Terese Door, PA  rosuvastatin (CRESTOR) 20 MG tablet Take 1 tablet (20 mg total) by mouth daily. 11/19/19   Charlott Rakes, MD  traZODone (DESYREL) 100 MG tablet Take 1 tablet (100 mg total) by mouth at bedtime. 02/03/20   [name], PA    Family History Family History  Problem Relation Age of Onset  . CAD Mother   . Diabetes Father   . Colon cancer Maternal Aunt 42  . Diabetes Maternal Aunt   . Heart disease Maternal Uncle   . Esophageal cancer Neg Hx   . Rectal cancer Neg Hx   . Stomach cancer Neg Hx     Social History Social History   Tobacco Use  . Smoking status: Current Every Day Smoker    Packs/day: 0.50    Years: 15.00    Pack years: 7.50    Types: Cigarettes  . Smokeless tobacco: Never Used  . Tobacco comment: has patches when he is ready to quit  Vaping Use  . Vaping Use: Never used  Substance Use Topics  . Alcohol use: Yes    Comment: occassional use on weekends  . Drug use: Not Currently    Types: Cocaine, Marijuana    Comment: last used cocaine and marijuana "years ago"     Allergies   Penicillins, Latex, and Tape   Review of Systems Review of Systems  Constitutional: Negative for chills and fever.  HENT: Negative for ear pain and sore throat.   Eyes: Negative for pain and visual disturbance.  Respiratory: Negative for cough and shortness of breath.   Cardiovascular: Negative for chest pain and palpitations.  Gastrointestinal: Negative for abdominal pain and vomiting.  Genitourinary: Negative for dysuria and hematuria.  Musculoskeletal: Positive for back pain. Negative for arthralgias.  Skin: Negative for color change and rash.  Neurological: Negative for seizures and syncope.  All other systems reviewed and are negative.    Physical Exam Triage Vital Signs ED Triage Vitals  Enc Vitals Group     BP 03/06/20 1141 127/73     Pulse Rate 03/06/20 1141 68     Resp 03/06/20 1141 19     Temp 03/06/20 1141 97.7 F (36.5 C)     Temp Source 03/06/20 1141 Oral     SpO2 03/06/20 1141 98 %     Weight --      Height --      Head Circumference --      Peak Flow --      Pain Score 03/06/20 1139 10     Pain Loc --      Pain Edu? --      Excl. in [place]? --    No  data found.  Updated Vital Signs BP 127/73 (BP Location: Right Arm)   Pulse 68   Temp 97.7 F (36.5 C) (Oral)   Resp 19   SpO2 98%   Visual Acuity Right Eye Distance:   Left Eye Distance:   Bilateral Distance:    Right Eye Near:   Left Eye Near:    Bilateral Near:     Physical Exam Vitals  and nursing note reviewed.  Constitutional:      General: He is not in acute distress.    Appearance: He is well-developed and well-nourished. He is not ill-appearing.  HENT:     Head: Normocephalic and atraumatic.     Mouth/Throat:     Mouth: Mucous membranes are moist.  Eyes:     Conjunctiva/sclera: Conjunctivae normal.  Cardiovascular:     Rate and Rhythm: Normal rate and regular rhythm.     Heart sounds: Normal heart sounds.  Pulmonary:     Effort: Pulmonary effort is normal. No respiratory distress.     Breath sounds: Normal breath sounds.  Abdominal:     Palpations: Abdomen is soft.     Tenderness: There is no abdominal tenderness. There is no right CVA tenderness, left CVA tenderness, guarding or rebound.  Musculoskeletal:        General: No swelling, tenderness, deformity or edema. Normal range of motion.     Cervical back: Neck supple.  Skin:    General: Skin is warm and dry.     Capillary Refill: Capillary refill takes less than 2 seconds.     Findings: No bruising, erythema, lesion or rash.  Neurological:     General: No focal deficit present.     Mental Status: He is alert and oriented to person, place, and time.     Sensory: No sensory deficit.     Motor: No weakness.     Gait: Gait normal.     Comments: Negative straight leg raise.  Psychiatric:        Mood and Affect: Mood and affect and mood normal.        Behavior: Behavior normal.      UC Treatments / Results  Labs (all labs ordered are listed, but only abnormal results are displayed) Labs Reviewed  POCT URINALYSIS DIPSTICK, ED / UC    EKG   Radiology No results found.  Procedures Procedures  (including critical care time)  Medications Ordered in UC Medications - No data to display  Initial Impression / Assessment and Plan / UC Course  I have reviewed the triage vital signs and the nursing notes.  Pertinent labs & imaging results that were available during my care of the patient were reviewed by me and considered in my medical decision making (see chart for details).   Acute left lower back pain without sciatica. Patient is well-appearing and his exam is reassuring. Urine normal. Due to his history of kidney failure, no NSAIDs prescribed today. Treating with short course of Robaxin. Precautions for drowsiness with this medication discussed. Instructed patient to follow-up with his PCP or an orthopedist if his symptoms are not improving. He agrees to plan of care.   Final Clinical Impressions(s) / UC Diagnoses   Final diagnoses:  Acute left-sided low back pain without sciatica     Discharge Instructions     Take the muscle relaxer as needed for muscle spasm; Do not drive, operate machinery, or drink alcohol with this medication as it can cause drowsiness.   Follow up with your primary care provider or an orthopedist if your symptoms are not improving.        ED Prescriptions    Medication Sig Dispense Auth. Provider   methocarbamol (ROBAXIN) 500 MG tablet Take 1 tablet (500 mg total) by mouth 2 (two) times daily as needed for muscle spasms. 10 tablet Sharion Balloon, NP     I have reviewed the PDMP during this encounter.  Sharion Balloon, NP 03/06/20 1239

## 2020-03-07 ENCOUNTER — Encounter (HOSPITAL_COMMUNITY): Payer: Self-pay | Admitting: Emergency Medicine

## 2020-03-07 ENCOUNTER — Inpatient Hospital Stay (HOSPITAL_COMMUNITY)
Admission: RE | Admit: 2020-03-07 | Discharge: 2020-03-10 | DRG: 885 | Payer: Medicaid Other | Source: Intra-hospital | Attending: Psychiatry | Admitting: Psychiatry

## 2020-03-07 ENCOUNTER — Emergency Department (HOSPITAL_COMMUNITY): Admission: EM | Admit: 2020-03-07 | Payer: Medicaid Other | Source: Home / Self Care

## 2020-03-07 ENCOUNTER — Emergency Department (HOSPITAL_COMMUNITY)
Admission: EM | Admit: 2020-03-07 | Discharge: 2020-03-07 | Disposition: A | Discharge status: Other Acute Inpatient Hospital | Payer: Medicaid Other | Attending: Emergency Medicine | Admitting: Emergency Medicine

## 2020-03-07 DIAGNOSIS — Z720 Tobacco use: Secondary | ICD-10-CM

## 2020-03-07 DIAGNOSIS — Z91040 Latex allergy status: Secondary | ICD-10-CM | POA: Diagnosis not present

## 2020-03-07 DIAGNOSIS — F5105 Insomnia due to other mental disorder: Secondary | ICD-10-CM

## 2020-03-07 DIAGNOSIS — I120 Hypertensive chronic kidney disease with stage 5 chronic kidney disease or end stage renal disease: Secondary | ICD-10-CM | POA: Insufficient documentation

## 2020-03-07 DIAGNOSIS — Z992 Dependence on renal dialysis: Secondary | ICD-10-CM | POA: Insufficient documentation

## 2020-03-07 DIAGNOSIS — Z21 Asymptomatic human immunodeficiency virus [HIV] infection status: Secondary | ICD-10-CM | POA: Insufficient documentation

## 2020-03-07 DIAGNOSIS — R45851 Suicidal ideations: Secondary | ICD-10-CM | POA: Insufficient documentation

## 2020-03-07 DIAGNOSIS — Z20822 Contact with and (suspected) exposure to covid-19: Secondary | ICD-10-CM | POA: Diagnosis present

## 2020-03-07 DIAGNOSIS — F329 Major depressive disorder, single episode, unspecified: Secondary | ICD-10-CM | POA: Insufficient documentation

## 2020-03-07 DIAGNOSIS — Y900 Blood alcohol level of less than 20 mg/100 ml: Secondary | ICD-10-CM | POA: Insufficient documentation

## 2020-03-07 DIAGNOSIS — G4700 Insomnia, unspecified: Secondary | ICD-10-CM | POA: Diagnosis present

## 2020-03-07 DIAGNOSIS — F250 Schizoaffective disorder, bipolar type: Secondary | ICD-10-CM | POA: Diagnosis present

## 2020-03-07 DIAGNOSIS — K219 Gastro-esophageal reflux disease without esophagitis: Secondary | ICD-10-CM | POA: Diagnosis present

## 2020-03-07 DIAGNOSIS — B20 Human immunodeficiency virus [HIV] disease: Secondary | ICD-10-CM | POA: Diagnosis present

## 2020-03-07 DIAGNOSIS — F1420 Cocaine dependence, uncomplicated: Secondary | ICD-10-CM | POA: Diagnosis not present

## 2020-03-07 DIAGNOSIS — F209 Schizophrenia, unspecified: Secondary | ICD-10-CM | POA: Diagnosis not present

## 2020-03-07 DIAGNOSIS — Z9114 Patient's other noncompliance with medication regimen: Secondary | ICD-10-CM

## 2020-03-07 DIAGNOSIS — R443 Hallucinations, unspecified: Secondary | ICD-10-CM | POA: Diagnosis present

## 2020-03-07 DIAGNOSIS — F32A Depression, unspecified: Secondary | ICD-10-CM

## 2020-03-07 DIAGNOSIS — E785 Hyperlipidemia, unspecified: Secondary | ICD-10-CM | POA: Diagnosis present

## 2020-03-07 DIAGNOSIS — F251 Schizoaffective disorder, depressive type: Principal | ICD-10-CM | POA: Diagnosis present

## 2020-03-07 DIAGNOSIS — N186 End stage renal disease: Secondary | ICD-10-CM | POA: Diagnosis not present

## 2020-03-07 DIAGNOSIS — F17210 Nicotine dependence, cigarettes, uncomplicated: Secondary | ICD-10-CM | POA: Diagnosis present

## 2020-03-07 DIAGNOSIS — K760 Fatty (change of) liver, not elsewhere classified: Secondary | ICD-10-CM | POA: Diagnosis present

## 2020-03-07 DIAGNOSIS — R7303 Prediabetes: Secondary | ICD-10-CM | POA: Diagnosis present

## 2020-03-07 DIAGNOSIS — F1413 Cocaine abuse, unspecified with withdrawal: Secondary | ICD-10-CM | POA: Diagnosis present

## 2020-03-07 DIAGNOSIS — E7800 Pure hypercholesterolemia, unspecified: Secondary | ICD-10-CM

## 2020-03-07 LAB — RAPID URINE DRUG SCREEN, HOSP PERFORMED
Amphetamines: NOT DETECTED
Barbiturates: NOT DETECTED
Benzodiazepines: NOT DETECTED
Cocaine: POSITIVE — AB
Opiates: NOT DETECTED
Tetrahydrocannabinol: POSITIVE — AB

## 2020-03-07 LAB — SALICYLATE LEVEL: Salicylate Lvl: <7 mg/dL — ABNORMAL LOW (ref 7.0–30.0)

## 2020-03-07 LAB — COMPREHENSIVE METABOLIC PANEL
ALT: 36 U/L (ref 0–44)
AST: 34 U/L (ref 15–41)
Albumin: 4.5 g/dL (ref 3.5–5.0)
Alkaline Phosphatase: 91 U/L (ref 38–126)
Anion gap: 10 (ref 5–15)
BUN: 14 mg/dL (ref 6–20)
CO2: 26 mmol/L (ref 22–32)
Calcium: 9.7 mg/dL (ref 8.9–10.3)
Chloride: 101 mmol/L (ref 98–111)
Creatinine, Ser: 1.1 mg/dL (ref 0.61–1.24)
GFR, Estimated: >60 mL/min (ref >60)
Glucose, Bld: 104 mg/dL — ABNORMAL HIGH (ref 70–99)
Potassium: 3.7 mmol/L (ref 3.5–5.1)
Sodium: 137 mmol/L (ref 135–145)
Total Bilirubin: 0.8 mg/dL (ref 0.3–1.2)
Total Protein: 8.2 g/dL — ABNORMAL HIGH (ref 6.5–8.1)

## 2020-03-07 LAB — CBC
HCT: 46.3 % (ref 39.0–52.0)
Hemoglobin: 15.2 g/dL (ref 13.0–17.0)
MCH: 30.8 pg (ref 26.0–34.0)
MCHC: 32.8 g/dL (ref 30.0–36.0)
MCV: 93.7 fL (ref 80.0–100.0)
Platelets: 212 10*3/uL (ref 150–400)
RBC: 4.94 MIL/uL (ref 4.22–5.81)
RDW: 12.8 % (ref 11.5–15.5)
WBC: 6.5 10*3/uL (ref 4.0–10.5)
nRBC: 0 % (ref 0.0–0.2)

## 2020-03-07 LAB — RESP PANEL BY RT-PCR (FLU A&B, COVID) ARPGX2
Influenza A by PCR: NEGATIVE
Influenza B by PCR: NEGATIVE
SARS Coronavirus 2 by RT PCR: NEGATIVE

## 2020-03-07 LAB — ETHANOL: Alcohol, Ethyl (B): <10 mg/dL (ref <10)

## 2020-03-07 LAB — ACETAMINOPHEN LEVEL: Acetaminophen (Tylenol), Serum: <10 ug/mL — ABNORMAL LOW (ref 10–30)

## 2020-03-07 MED ORDER — BUPROPION HCL ER (XL) 150 MG PO TB24
150.0000 mg | ORAL_TABLET | Freq: Every day | ORAL | Status: DC
  Administered 2020-03-07: 150 mg via ORAL
  Filled 2020-03-07: qty 1

## 2020-03-07 MED ORDER — BICTEGRAVIR-EMTRICITAB-TENOFOV 50-200-25 MG PO TABS: 1.0000 | ORAL_TABLET | Freq: Every day | ORAL | Status: DC

## 2020-03-07 MED ORDER — METHOCARBAMOL 500 MG PO TABS: 500.0000 mg | ORAL_TABLET | Freq: Once | ORAL | Status: DC

## 2020-03-07 MED ORDER — HYDROXYZINE HCL 25 MG PO TABS: 25.0000 mg | ORAL_TABLET | Freq: Three times a day (TID) | ORAL | Status: DC | PRN

## 2020-03-07 MED ORDER — QUETIAPINE FUMARATE 50 MG PO TABS
50.0000 mg | ORAL_TABLET | Freq: Two times a day (BID) | ORAL | Status: DC
  Administered 2020-03-07: 50 mg via ORAL
  Filled 2020-03-07: qty 1

## 2020-03-07 MED ORDER — TRAZODONE HCL 100 MG PO TABS
100.0000 mg | ORAL_TABLET | Freq: Every day | ORAL | Status: DC
  Administered 2020-03-07: 100 mg via ORAL
  Filled 2020-03-07: qty 1

## 2020-03-07 NOTE — BH Assessment (Signed)
Attempted assessment.  Cart is not picking up.  Texted RN.

## 2020-03-07 NOTE — BH Assessment (Signed)
Pt consented for clinician to contact his fiance' ([name], [phone]) to gather additional information. The phone rang once and went to voicemail. Clinician left a HIPPA compliant voice message with call back information.    Vertell Novak, [place], [facility], [facility] Triage Specialist [phone]

## 2020-03-07 NOTE — ED Provider Notes (Signed)
[facility] DEPT Provider Note   CSN: [id number] Arrival date & time: 03/07/20  1050     History Chief Complaint  Patient presents with  . Suicidal  . Hallucinations    Dylan [name] is a 55 y.o. male.  Patient with hx mental health illness, presents indicating in past few days increasing feelings of depression, and thoughts of suicide, stating he fashioned a rope to choke himself today. Symptoms gradual onset, mod-severe, constant, felt worse today. Denies attempting to harm self. Denies ingestion. States 'lots of things' contributing to symptoms, and adds that he feels the devil is chasing him. States compliant w home meds. Denies headache. No chest pain or sob. No abd pain or nvd. Denies fever or chills.   The history is provided by the patient.       Past Medical History:  Diagnosis Date  . Anxiety   . Arthritis   . Bipolar 1 disorder ([place])   . Colon polyps   . Depression   . GERD (gastroesophageal reflux disease)   . Hepatitis B   . Human immunodeficiency virus (HIV) ([place])   . Hyperlipidemia   . Hypertension   . Neuromuscular disorder (HCC)    neuropathy  . Neuropathy   . Pre-diabetes   . Schizophrenia [facility])     Patient Active Problem List   Diagnosis Date Noted  . Generalized anxiety disorder 02/03/2020  . Insomnia due to other mental disorder 02/03/2020  . Lisfranc dislocation, right, initial encounter   . Prediabetes 07/22/2019  . Tobacco abuse 07/10/2018  . ESRD needing dialysis ([place]) 03/03/2018  . Cocaine abuse with cocaine-induced mood disorder ([place]) 05/18/2017  . Schizoaffective disorder, bipolar type ([place])   . Acute renal failure (ARF) ([place])   . Suicidal ideations   . Chronic 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 coma ([place])   . AKI (acute kidney injury) ([place]) 02/19/2017  . Rhabdomyolysis 02/19/2017  . Suicidal overdose ([place]) 02/19/2017  . Acute hepatitis 02/19/2017  . Elevated LFTs   . Homicidal  ideations   . Schizoaffective disorder, depressive type ([place]) 09/05/2011  . 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 Degenerative disc disease 08/21/2011  . HIV disease ([place]) 08/17/2011    Past Surgical History:  Procedure Laterality Date  . FOOT ARTHRODESIS Right 09/17/2019   Procedure: FUSION RIGHT LISFRANC JOINT;  Surgeon: Newt Minion, MD;  Location: [facility];  Service: Orthopedics;  Laterality: Right;  . HEMORROIDECTOMY    . IR FLUORO GUIDE CV LINE RIGHT  02/22/2017  . IR US GUIDE VASC ACCESS RIGHT  02/22/2017  . TUMOR REMOVAL     From Chest       Family History  Problem Relation Age of Onset  . CAD Mother   . Diabetes Father   . Colon cancer Maternal Aunt 40  . Diabetes Maternal Aunt   . Heart disease Maternal Uncle   . Esophageal cancer Neg Hx   . Rectal cancer Neg Hx   . Stomach cancer Neg Hx     Social History   Tobacco Use  . Smoking status: Current Every Day Smoker    Packs/day: 0.50    Years: 15.00    Pack years: 7.50    Types: Cigarettes  . Smokeless tobacco: Never Used  . Tobacco comment: has patches when he is ready to quit  Vaping Use  . Vaping Use: Never used  Substance Use Topics  . Alcohol use: Yes  Comment: occassional use on weekends  . Drug use: Not Currently    Types: Cocaine, Marijuana    Comment: last used cocaine and marijuana "years ago"    Home Medications Prior to Admission medications   Medication Sig Start Date End Date Taking? Authorizing Provider  BIKTARVY 50-200-25 MG TABS tablet TAKE 1 TABLET BY MOUTH DAILY 03/01/20   Carlyle Basques, MD  buPROPion (WELLBUTRIN XL) 150 MG 24 hr tablet Take 1 tablet (150 mg total) by mouth daily. 11/19/19   Charlott Rakes, MD  gabapentin (NEURONTIN) 300 MG capsule Take 2 capsules (600 mg total) by mouth 2 (two) times daily. 11/19/19   Charlott Rakes, MD  hydrOXYzine (ATARAX/VISTARIL) 25 MG tablet Take 1 tablet (25 mg total) by mouth 3 (three) times daily as  needed for anxiety. 12/23/19   Nwoko, Terese Door, PA  meloxicam (MOBIC) 7.5 MG tablet Take 1 tablet (7.5 mg total) by mouth daily. 01/15/20   Persons, Bevely Palmer, PA  methocarbamol (ROBAXIN) 500 MG tablet Take 1 tablet (500 mg total) by mouth 2 (two) times daily as needed for muscle spasms. 03/06/20   Sharion Balloon, NP  omeprazole (PRILOSEC) 20 MG capsule Take 1 capsule (20 mg total) by mouth daily. 02/09/20   Charlott Rakes, MD  QUEtiapine (SEROQUEL) 50 MG tablet Take 1 tablet (50 mg total) by mouth 2 (two) times daily. 02/03/20   Nwoko, Terese Door, PA  rosuvastatin (CRESTOR) 20 MG tablet Take 1 tablet (20 mg total) by mouth daily. 11/19/19   Charlott Rakes, MD  traZODone (DESYREL) 100 MG tablet Take 1 tablet (100 mg total) by mouth at bedtime. 02/03/20   [name], PA    Allergies    Penicillins, Latex, and Tape  Review of Systems   Review of Systems  Constitutional: Negative for fever.  HENT: Negative for sore throat.   Eyes: Negative for visual disturbance.  Respiratory: Negative for cough and shortness of breath.   Cardiovascular: Negative for chest pain.  Gastrointestinal: Negative for abdominal pain, diarrhea and vomiting.  Genitourinary: Negative for flank pain.  Musculoskeletal: Negative for back pain and neck pain.  Skin: Negative for rash.  Neurological: Negative for headaches.  Hematological: Does not bruise/bleed easily.  Psychiatric/Behavioral: Positive for dysphoric mood and suicidal ideas. Negative for confusion.    Physical Exam Updated Vital Signs BP 135/86 (BP Location: Left Arm)   Pulse 91   Temp 98 F (36.7 C) (Oral)   Resp 20   SpO2 97%   Physical Exam Vitals and nursing note reviewed.  Constitutional:      Appearance: Normal appearance. He is well-developed.  HENT:     Head: Atraumatic.     Nose: Nose normal.     Mouth/Throat:     Mouth: Mucous membranes are moist.     Pharynx: Oropharynx is clear.  Eyes:     General: No scleral icterus.     Conjunctiva/sclera: Conjunctivae normal.     Pupils: Pupils are equal, round, and reactive to light.  Neck:     Trachea: No tracheal deviation.  Cardiovascular:     Rate and Rhythm: Normal rate and regular rhythm.     Pulses: Normal pulses.     Heart sounds: Normal heart sounds. No murmur heard. No friction rub. No gallop.   Pulmonary:     Effort: Pulmonary effort is normal. No accessory muscle usage or respiratory distress.     Breath sounds: Normal breath sounds.  Abdominal:     General: Bowel sounds  are normal. There is no distension.     Palpations: Abdomen is soft.     Tenderness: There is no abdominal tenderness. There is no guarding.  Genitourinary:    Comments: No cva tenderness. Musculoskeletal:        General: No swelling.     Cervical back: Normal range of motion and neck supple. No rigidity.  Skin:    General: Skin is warm and dry.     Findings: No rash.  Neurological:     Mental Status: He is alert.     Comments: Alert, speech clear. Steady gait.   Psychiatric:     Comments: Depressed mood. +SI.      ED Results / Procedures / Treatments   Labs (all labs ordered are listed, but only abnormal results are displayed) Results for orders placed or performed during the hospital encounter of 03/07/20  Comprehensive metabolic panel  Result Value Ref Range   Sodium 137 135 - 145 mmol/L   Potassium 3.7 3.5 - 5.1 mmol/L   Chloride 101 98 - 111 mmol/L   CO2 26 22 - 32 mmol/L   Glucose, Bld 104 (H) 70 - 99 mg/dL   BUN 14 6 - 20 mg/dL   Creatinine, Ser 1.10 0.61 - 1.24 mg/dL   Calcium 9.7 8.9 - 10.3 mg/dL   Total Protein 8.2 (H) 6.5 - 8.1 g/dL   Albumin 4.5 3.5 - 5.0 g/dL   AST 34 15 - 41 U/L   ALT 36 0 - 44 U/L   Alkaline Phosphatase 91 38 - 126 U/L   Total Bilirubin 0.8 0.3 - 1.2 mg/dL   GFR, Estimated >60 >60 mL/min   Anion gap 10 5 - 15  Ethanol  Result Value Ref Range   Alcohol, Ethyl (B) Q000111Q Q000111Q mg/dL  Salicylate level  Result Value Ref Range    Salicylate Lvl Q000111Q (L) 7.0 - 30.0 mg/dL  Acetaminophen level  Result Value Ref Range   Acetaminophen (Tylenol), Serum <10 (L) 10 - 30 ug/mL  cbc  Result Value Ref Range   WBC 6.5 4.0 - 10.5 K/uL   RBC 4.94 4.22 - 5.81 MIL/uL   Hemoglobin 15.2 13.0 - 17.0 g/dL   HCT 46.3 39.0 - 52.0 %   MCV 93.7 80.0 - 100.0 fL   MCH 30.8 26.0 - 34.0 pg   MCHC 32.8 30.0 - 36.0 g/dL   RDW 12.8 11.5 - 15.5 %   Platelets 212 150 - 400 K/uL   nRBC 0.0 0.0 - 0.2 %    EKG None  Radiology No results found.  Procedures Procedures   Medications Ordered in ED Medications - No data to display  ED Course  I have reviewed the triage vital signs and the nursing notes.  Pertinent labs & imaging results that were available during my care of the patient were reviewed by me and considered in my medical decision making (see chart for details).    MDM Rules/Calculators/A&P                         Labs sent.  [facility] team consulted.   Reviewed nursing notes and prior charts for additional history.   Labs reviewed/interpreted by me - chem normal.  The patient has been placed in psychiatric observation due to the need to provide a safe environment for the patient while obtaining psychiatric consultation and evaluation, as well as ongoing medical and medication management to treat the patient's condition.  Disposition per [facility] team.   Med rec not yet done - will need home meds continued once med rec done.       Final Clinical Impression(s) / ED Diagnoses Final diagnoses:  None    Rx / DC Orders ED Discharge Orders    None       Lajean Saver, MD 03/07/20 1355
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
## 2020-03-07 NOTE — BH Assessment (Signed)
Disposition: Leandro Reasoner, FNP recommends inpatient treatment. Per [name], RN pt has been accepted to [facility]. Pt has been assigned to room/bed: 501-1. Pt can come anytime. Attending physician: [name]. Disposition dicussed with [name], RN via secure chat and [name].     Flowsheet Row ED from 03/07/2020 in [place] DEPT ED from 03/06/2020 in [facility] ED from 11/09/2019 in [place] High Risk No Risk High Risk     Pt is in the hall but is in the process of being transferred to [facility] for inpatient treatment.   Vertell Novak, [place], [facility], [facility] Triage Specialist [phone]

## 2020-03-07 NOTE — ED Provider Notes (Signed)
Per TTS, patient has been accepted at the behavioral health [facility] for admission.   Daleen Bo, MD 03/07/20 [id number]

## 2020-03-07 NOTE — ED Triage Notes (Signed)
Patient here from home reporting suicidal thoughts that started yesterday. Reports trying to hang self this morning. No bruises not to neck. Hallucinations - "the devil is chasing me".

## 2020-03-07 NOTE — BH Assessment (Signed)
Clinician spoke to [facility] at [phone] if she can put the pt in a private room to complete his TTS assessment, clinician to call in 10 minutes.   Clinician called the cart 10 minutes later however there was no answer. Clinic an to check back.    Vertell Novak, [place], [facility], [facility] Triage Specialist [phone]

## 2020-03-08 ENCOUNTER — Encounter (HOSPITAL_COMMUNITY): Payer: Self-pay | Admitting: Psychiatry

## 2020-03-08 ENCOUNTER — Other Ambulatory Visit: Payer: Self-pay

## 2020-03-08 DIAGNOSIS — F209 Schizophrenia, unspecified: Secondary | ICD-10-CM | POA: Diagnosis present

## 2020-03-08 DIAGNOSIS — F250 Schizoaffective disorder, bipolar type: Secondary | ICD-10-CM | POA: Diagnosis not present

## 2020-03-08 LAB — LIPID PANEL
Cholesterol: 254 mg/dL — ABNORMAL HIGH (ref 0–200)
HDL: 41 mg/dL (ref >40)
LDL Cholesterol: 172 mg/dL — ABNORMAL HIGH (ref 0–99)
Total CHOL/HDL Ratio: 6.2 RATIO
Triglycerides: 204 mg/dL — ABNORMAL HIGH (ref <150)
VLDL: 41 mg/dL — ABNORMAL HIGH (ref 0–40)

## 2020-03-08 LAB — HEMOGLOBIN A1C
Hgb A1c MFr Bld: 6 % — ABNORMAL HIGH (ref 4.8–5.6)
Mean Plasma Glucose: 125.5 mg/dL

## 2020-03-08 LAB — TSH: TSH: 1.727 u[IU]/mL (ref 0.350–4.500)

## 2020-03-08 MED ORDER — NICOTINE POLACRILEX 2 MG MT GUM
2.0000 mg | CHEWING_GUM | OROMUCOSAL | Status: DC | PRN
  Administered 2020-03-09 (×2): 2 mg via ORAL

## 2020-03-08 MED ORDER — GABAPENTIN 300 MG PO CAPS
600.0000 mg | ORAL_CAPSULE | Freq: Two times a day (BID) | ORAL | Status: DC
  Administered 2020-03-08 – 2020-03-10 (×5): 600 mg via ORAL
  Filled 2020-03-08 (×8): qty 2

## 2020-03-08 MED ORDER — BICTEGRAVIR-EMTRICITAB-TENOFOV 50-200-25 MG PO TABS
1.0000 | ORAL_TABLET | Freq: Every day | ORAL | Status: DC
  Administered 2020-03-08 – 2020-03-09 (×2): 1 via ORAL
  Filled 2020-03-08 (×4): qty 1

## 2020-03-08 MED ORDER — FOLIC ACID 1 MG PO TABS
1.0000 mg | ORAL_TABLET | Freq: Every day | ORAL | Status: DC
  Administered 2020-03-08 – 2020-03-10 (×3): 1 mg via ORAL
  Filled 2020-03-08 (×5): qty 1

## 2020-03-08 MED ORDER — HYDROXYZINE HCL 25 MG PO TABS: 25.0000 mg | ORAL_TABLET | Freq: Three times a day (TID) | ORAL | Status: DC | PRN

## 2020-03-08 MED ORDER — MELOXICAM 7.5 MG PO TABS
7.5000 mg | ORAL_TABLET | Freq: Every day | ORAL | Status: DC
  Administered 2020-03-08 – 2020-03-10 (×3): 7.5 mg via ORAL
  Filled 2020-03-08 (×5): qty 1

## 2020-03-08 MED ORDER — PANTOPRAZOLE SODIUM 40 MG PO TBEC
40.0000 mg | DELAYED_RELEASE_TABLET | Freq: Every day | ORAL | Status: DC
  Administered 2020-03-08 – 2020-03-10 (×4): 40 mg via ORAL
  Filled 2020-03-08 (×5): qty 1
  Filled 2020-03-08: qty 14
  Filled 2020-03-08: qty 1

## 2020-03-08 MED ORDER — TRAZODONE HCL 100 MG PO TABS
100.0000 mg | ORAL_TABLET | Freq: Every day | ORAL | Status: DC
  Administered 2020-03-08 – 2020-03-09 (×2): 100 mg via ORAL
  Filled 2020-03-08 (×3): qty 1
  Filled 2020-03-08: qty 14

## 2020-03-08 MED ORDER — ROSUVASTATIN CALCIUM 20 MG PO TABS
20.0000 mg | ORAL_TABLET | Freq: Every day | ORAL | Status: DC
  Administered 2020-03-08 – 2020-03-09 (×2): 20 mg via ORAL
  Filled 2020-03-08: qty 14
  Filled 2020-03-08 (×4): qty 1

## 2020-03-08 MED ORDER — LORAZEPAM 1 MG PO TABS: 1.0000 mg | ORAL_TABLET | Freq: Four times a day (QID) | ORAL | Status: DC | PRN

## 2020-03-08 MED ORDER — QUETIAPINE FUMARATE 50 MG PO TABS
50.0000 mg | ORAL_TABLET | Freq: Every day | ORAL | Status: DC
  Filled 2020-03-08: qty 1

## 2020-03-08 MED ORDER — METHOCARBAMOL 500 MG PO TABS
500.0000 mg | ORAL_TABLET | Freq: Two times a day (BID) | ORAL | Status: DC | PRN
  Administered 2020-03-08: 500 mg via ORAL
  Filled 2020-03-08: qty 1

## 2020-03-08 MED ORDER — BUPROPION HCL ER (XL) 150 MG PO TB24
150.0000 mg | ORAL_TABLET | Freq: Every day | ORAL | Status: DC
  Administered 2020-03-08 – 2020-03-09 (×2): 150 mg via ORAL
  Filled 2020-03-08: qty 1
  Filled 2020-03-08: qty 14
  Filled 2020-03-08 (×3): qty 1

## 2020-03-08 MED ORDER — ACETAMINOPHEN 325 MG PO TABS
650.0000 mg | ORAL_TABLET | Freq: Four times a day (QID) | ORAL | Status: DC | PRN
  Administered 2020-03-08 (×2): 650 mg via ORAL
  Filled 2020-03-08 (×2): qty 2

## 2020-03-08 MED ORDER — QUETIAPINE FUMARATE 100 MG PO TABS
100.0000 mg | ORAL_TABLET | Freq: Every day | ORAL | Status: DC
  Administered 2020-03-08 – 2020-03-09 (×2): 100 mg via ORAL
  Filled 2020-03-08 (×3): qty 1
  Filled 2020-03-08: qty 14

## 2020-03-08 MED ORDER — THIAMINE HCL 100 MG PO TABS
100.0000 mg | ORAL_TABLET | Freq: Every day | ORAL | Status: DC
  Administered 2020-03-08 – 2020-03-10 (×3): 100 mg via ORAL
  Filled 2020-03-08 (×5): qty 1

## 2020-03-08 NOTE — Progress Notes (Deleted)
Patient refused stat lab for TSH, she reported that she is fine and does not need it done even after writer explained to her the reason for the lab ordered.

## 2020-03-08 NOTE — Plan of Care (Signed)
  Problem: Education: Goal: Knowledge of [facility] [facility] Education information/materials will improve Outcome: Progressing Goal: Emotional status will improve Outcome: Progressing Goal: Mental status will improve Outcome: Progressing Goal: Verbalization of understanding the information provided will improve Outcome: Progressing   

## 2020-03-08 NOTE — Progress Notes (Signed)
Adult Psychoeducational Group Note  Date:  03/08/2020 Time:  11:25 PM  Group Topic/Focus:  Wrap-Up Group:   The focus of this group is to help patients review their daily goal of treatment and discuss progress on daily workbooks.  Participation Level:  Active  Participation Quality:  Appropriate  Affect:  Anxious and Depressed  Cognitive:  Disorganized  Insight: Appropriate  Engagement in Group:  Limited  Modes of Intervention:  Discussion  Additional Comments: Pt is a new admission.  Pt stated his goal for today was to focus on his treatment plan. Pt stated he accomplished his goal today. Pt stated he was able to talk with his doctor and social worker about his care today. Pt rated his overall day an 5 out of 10. Pt stated he was able to contact his wife today, which improved his day. Pt stated he felt better about himself today. Pt stated he was glad he reach out for help and was admitted here. Pt stated his relationship with his family and support system needs to be improved. Pt stated he was able to attend all meals. Pt stated he took all medications provided today. Pt stated his appetite was fair today. Pt rated sleep last night was fair. Pt stated the goal for tonight was to get some rest tonight. Pt stated he was in some physical pain today. Pt stated he had moderate pain in his right and left feet and back. Pt rated the pain a level 5 on the pain rating scale. Pt deny visual hallucinations. Pt admitted to experiencing some auditory issues tonight. Pt nurse was updated on situation. Pt denies thoughts of harming himself or others. Pt stated he would alert staff if anything changes.   Candy Sledge 03/08/2020, 11:25 PM

## 2020-03-08 NOTE — Progress Notes (Signed)
Patient is a 55 year old male admitted voluntarily for SI and attempted to hang self with an extension cord reported by patient's fiancee. Pt reports drinking liquor along with cocaine use on Friday, He reports that he recently had a dream with his daughter, father and aunt all in this dream. They are all deceased and he reports this dream pushed him out there to drinking and using cocaine again.Pt reports auditory ( muffled sounds) and visual (shadows) hallucinations. Patient reports having had surgery in September on his right foot, He has 3 screws and reports that it has been swollen and causes constant pain. Skin assessment completed, patient oriented to unit and meal provided. 15 min checks for safety.

## 2020-03-08 NOTE — Progress Notes (Signed)
Recreation Therapy Notes  Date: 2.28.22 Time: 1000 Location: 500 [address] Dayroom  Group Topic: Coping Skills  Goal Area(s) Addresses:  Patient will identify positive coping skills. Patient will identify benefits of using coping skills post d/c.  Intervention: Blank mind map  Activity: Mind Map.  LRT and patients will fill in the first 8 slots (anxiety, depression, stress, anger, sadness, dealing family/friends, pain and work) together.  Patients would then have time to individually come up with at least 3 coping skills for each area.  Patients would then come back together and LRT will write patient responses on board.  Education:Coping Skills, Discharge Planning.   Education Outcome: Acknowledges understanding/In group clarification offered/Needs additional education.   Clinical Observations/Feedback: Pt did not attend group session.    Victorino Sparrow, LRT/CTRS         Victorino Sparrow A 03/08/2020 12:10 PM

## 2020-03-08 NOTE — Tx Team (Signed)
Initial Treatment Plan 03/08/2020 1:15 AM Dylan [name] [id number]    PATIENT STRESSORS: Health problems - foot surgery pxs (September) Multiple losses- daughter and father several years ago and aunt last year    PATIENT STRENGTHS: Ability for insight Capable of independent living Motivation for treatment/growth   PATIENT IDENTIFIED PROBLEMS: SI  Substance and ETOH abuse                   DISCHARGE CRITERIA:  Improved stabilization in mood, thinking, and/or behavior  PRELIMINARY DISCHARGE PLAN: Outpatient therapy Return to previous living arrangement  PATIENT/FAMILY INVOLVEMENT: This treatment plan has been presented to and reviewed with the patient, Dylan [name], and/or family member.  The patient and family have been given the opportunity to ask questions and make suggestions.  Aurora Mask, RN 03/08/2020, 1:15 AM
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
## 2020-03-08 NOTE — [facility] Group Notes (Signed)
LCSW Group Therapy Note 03/08/2020 1:15pm  Type of Therapy and Topic:  Group Therapy:  Setting Goals  Participation Level:  Active  Description of Group: In this process group, patients discussed using strengths to work toward goals and address challenges.  Patients identified two positive things about themselves and one goal they were working on.  Patients were given the opportunity to share openly and support each other's plan for self-empowerment.  The group discussed the value of gratitude and were encouraged to have a daily reflection of positive characteristics or circumstances.  Patients were encouraged to identify a plan to utilize their strengths to work on current challenges and goals.  Therapeutic Goals 1. Patient will verbalize personal strengths/positive qualities and relate how these can assist with achieving desired personal goals 2. Patients will verbalize affirmation of peers plans for personal change and goal setting 3. Patients will explore the value of gratitude and positive focus as related to successful achievement of goals 4. Patients will verbalize a plan for regular reinforcement of personal positive qualities and circumstances.  Summary of Patient Progress: Patient attended and participant in group.  Patient shared that boundaries are a weakness of his and a barrier to moving forward in reaching his goals.    Therapeutic Modalities Cognitive Behavioral Therapy Motivational Interviewing    Vassie Moselle, LCSW 03/08/2020 2:49 PM

## 2020-03-08 NOTE — Tx Team (Signed)
Interdisciplinary Treatment and Diagnostic Plan Update  03/08/2020 Time of Session: 10:00am TRUST LEH MRN: [id number]  Principal Diagnosis: <principal problem not specified>  Secondary Diagnoses: Active Problems:   Schizophrenia ([place])   Current Medications:  Current Facility-Administered Medications  Medication Dose Route Frequency Provider Last Rate Last Admin  . acetaminophen (TYLENOL) tablet 650 mg  650 mg Oral Q6H PRN Ajibola, Ene A, NP   650 mg at 03/08/20 1205  . bictegravir-emtricitabine-tenofovir AF (BIKTARVY) 50-200-25 MG per tablet 1 tablet  1 tablet Oral Daily Prescilla Sours, PA-C   1 tablet at 03/08/20 1204  . buPROPion (WELLBUTRIN XL) 24 hr tablet 150 mg  150 mg Oral Daily Lovena Le, Cody W, PA-C      . folic acid (FOLVITE) tablet 1 mg  1 mg Oral Daily Sharma Covert, MD   1 mg at 03/08/20 1204  . gabapentin (NEURONTIN) capsule 600 mg  600 mg Oral BID Prescilla Sours, PA-C   600 mg at 03/08/20 1204  . hydrOXYzine (ATARAX/VISTARIL) tablet 25 mg  25 mg Oral TID PRN Prescilla Sours, PA-C      . LORazepam (ATIVAN) tablet 1 mg  1 mg Oral Q6H PRN Sharma Covert, MD      . meloxicam [facility]) tablet 7.5 mg  7.5 mg Oral Daily Margorie John W, PA-C   7.5 mg at 03/08/20 1204  . methocarbamol (ROBAXIN) tablet 500 mg  500 mg Oral BID PRN Prescilla Sours, PA-C   500 mg at 03/08/20 0030  . nicotine polacrilex (NICORETTE) gum 2 mg  2 mg Oral PRN Margorie John W, PA-C      . pantoprazole (PROTONIX) EC tablet 40 mg  40 mg Oral Daily Margorie John W, PA-C   40 mg at 03/08/20 0030  . QUEtiapine (SEROQUEL) tablet 100 mg  100 mg Oral QHS Sharma Covert, MD      . rosuvastatin (CRESTOR) tablet 20 mg  20 mg Oral Daily Margorie John W, PA-C   20 mg at 03/08/20 1204  . thiamine tablet 100 mg  100 mg Oral Daily Sharma Covert, MD   100 mg at 03/08/20 1204  . traZODone (DESYREL) tablet 100 mg  100 mg Oral QHS Prescilla Sours, PA-C       PTA Medications: Medications Prior to Admission   Medication Sig Dispense Refill Last Dose  . BIKTARVY 50-200-25 MG TABS tablet TAKE 1 TABLET BY MOUTH DAILY 30 tablet 0   . buPROPion (WELLBUTRIN XL) 150 MG 24 hr tablet Take 1 tablet (150 mg total) by mouth daily. 30 tablet 3   . gabapentin (NEURONTIN) 300 MG capsule Take 2 capsules (600 mg total) by mouth 2 (two) times daily. 120 capsule 3   . hydrOXYzine (ATARAX/VISTARIL) 25 MG tablet Take 1 tablet (25 mg total) by mouth 3 (three) times daily as needed for anxiety. 90 tablet 1   . meloxicam (MOBIC) 7.5 MG tablet Take 1 tablet (7.5 mg total) by mouth daily. 30 tablet 1   . methocarbamol (ROBAXIN) 500 MG tablet Take 1 tablet (500 mg total) by mouth 2 (two) times daily as needed for muscle spasms. 10 tablet 0   . omeprazole (PRILOSEC) 20 MG capsule Take 1 capsule (20 mg total) by mouth daily. 30 capsule 3   . QUEtiapine (SEROQUEL) 50 MG tablet Take 1 tablet (50 mg total) by mouth 2 (two) times daily. 60 tablet 2   . rosuvastatin (CRESTOR) 20 MG tablet Take 1 tablet (20 mg  total) by mouth daily. 30 tablet 3   . traZODone (DESYREL) 100 MG tablet Take 1 tablet (100 mg total) by mouth at bedtime. 30 tablet 2     Patient Stressors:    Patient Strengths: Ability for insight Capable of independent living Motivation for treatment/growth  Treatment Modalities: Medication Management, Group therapy, Case management,  1 to 1 session with clinician, Psychoeducation, Recreational therapy.   Physician Treatment Plan for Primary Diagnosis: <principal problem not specified> Long Term Goal(s): Improvement in symptoms so as ready for discharge Improvement in symptoms so as ready for discharge   Short Term Goals: Ability to identify changes in lifestyle to reduce recurrence of condition will improve Ability to verbalize feelings will improve Ability to disclose and discuss suicidal ideas Ability to demonstrate self-control will improve Ability to identify and develop effective coping behaviors will  improve Ability to maintain clinical measurements within normal limits will improve Compliance with prescribed medications will improve Ability to identify triggers associated with substance abuse/mental health issues will improve Ability to identify changes in lifestyle to reduce recurrence of condition will improve Ability to verbalize feelings will improve Ability to disclose and discuss suicidal ideas Ability to demonstrate self-control will improve Ability to identify and develop effective coping behaviors will improve Ability to maintain clinical measurements within normal limits will improve Compliance with prescribed medications will improve Ability to identify triggers associated with substance abuse/mental health issues will improve  Medication Management: Evaluate patient's response, side effects, and tolerance of medication regimen.  Therapeutic Interventions: 1 to 1 sessions, Unit Group sessions and Medication administration.  Evaluation of Outcomes: Not Met  Physician Treatment Plan for Secondary Diagnosis: Active Problems:   Schizophrenia ([place])  Long Term Goal(s): Improvement in symptoms so as ready for discharge Improvement in symptoms so as ready for discharge   Short Term Goals: Ability to identify changes in lifestyle to reduce recurrence of condition will improve Ability to verbalize feelings will improve Ability to disclose and discuss suicidal ideas Ability to demonstrate self-control will improve Ability to identify and develop effective coping behaviors will improve Ability to maintain clinical measurements within normal limits will improve Compliance with prescribed medications will improve Ability to identify triggers associated with substance abuse/mental health issues will improve Ability to identify changes in lifestyle to reduce recurrence of condition will improve Ability to verbalize feelings will improve Ability to disclose and discuss suicidal  ideas Ability to demonstrate self-control will improve Ability to identify and develop effective coping behaviors will improve Ability to maintain clinical measurements within normal limits will improve Compliance with prescribed medications will improve Ability to identify triggers associated with substance abuse/mental health issues will improve     Medication Management: Evaluate patient's response, side effects, and tolerance of medication regimen.  Therapeutic Interventions: 1 to 1 sessions, Unit Group sessions and Medication administration.  Evaluation of Outcomes: Not Met   RN Treatment Plan for Primary Diagnosis: <principal problem not specified> Long Term Goal(s): Knowledge of disease and therapeutic regimen to maintain health will improve  Short Term Goals: Ability to remain free from injury will improve, Ability to verbalize frustration and anger appropriately will improve, Ability to identify and develop effective coping behaviors will improve and Compliance with prescribed medications will improve  Medication Management: RN will administer medications as ordered by provider, will assess and evaluate patient's response and provide education to patient for prescribed medication. RN will report any adverse and/or side effects to prescribing provider.  Therapeutic Interventions: 1 on 1 counseling sessions, Psychoeducation,  Medication administration, Evaluate responses to treatment, Monitor vital signs and CBGs as ordered, Perform/monitor CIWA, COWS, AIMS and Fall Risk screenings as ordered, Perform wound care treatments as ordered.  Evaluation of Outcomes: Not Met   LCSW Treatment Plan for Primary Diagnosis: <principal problem not specified> Long Term Goal(s): Safe transition to appropriate next level of care at discharge, Engage patient in therapeutic group addressing interpersonal concerns.  Short Term Goals: Engage patient in aftercare planning with referrals and resources,  Increase social support, Increase ability to appropriately verbalize feelings, Facilitate patient progression through stages of change regarding substance use diagnoses and concerns, Identify triggers associated with mental health/substance abuse issues and Increase skills for wellness and recovery  Therapeutic Interventions: Assess for all discharge needs, 1 to 1 time with Social worker, Explore available resources and support systems, Assess for adequacy in community support network, Educate family and significant other(s) on suicide prevention, Complete Psychosocial Assessment, Interpersonal group therapy.  Evaluation of Outcomes: Not Met   Progress in Treatment: Attending groups: Yes. Participating in groups: Yes. Taking medication as prescribed: Yes. Toleration medication: Yes. Family/Significant other contact made: No, will contact:  if consent is provided Patient understands diagnosis: Yes. Discussing patient identified problems/goals with staff: Yes. Medical problems stabilized or resolved: Yes. Denies suicidal/homicidal ideation: Yes. Issues/concerns per patient self-inventory: No.   New problem(s) identified: No, Describe:  none  New Short Term/Long Term Goal(s): detox, medication management for mood stabilization; elimination of SI thoughts; development of comprehensive mental wellness/sobriety plan  Patient Goals:  "To get therapy, medicine, and rehab"   Discharge Plan or Barriers: Patient recently admitted. CSW will continue to follow and assess for appropriate referrals and possible discharge planning.    Reason for Continuation of Hospitalization: Depression Medication stabilization Suicidal ideation Withdrawal symptoms  Estimated Length of Stay: 3-5 days  Attendees: Patient: Dylan [name] 03/08/2020   Physician: Myles Lipps, MD 03/08/2020   Nursing:  03/08/2020   RN Care Manager: 03/08/2020  Social Worker: Darletta Moll, LCSW 03/08/2020   Recreational Therapist:   03/08/2020   Other:  03/08/2020   Other:  03/08/2020  Other: 03/08/2020     Scribe for Treatment Team: Vassie Moselle, LCSW 03/08/2020 2:09 PM

## 2020-03-08 NOTE — Progress Notes (Signed)
Recreation Therapy Notes  INPATIENT RECREATION THERAPY ASSESSMENT  Patient Details Name: DEONTAE BOXBERGER MRN: [id number] DOB: 06-25-1965 Today's Date: 03/08/2020       Information Obtained From: Patient  Able to Participate in Assessment/Interview: Yes  Patient Presentation: Alert  Reason for Admission (Per Patient): Other (Comments) (Pt stated mental health issues.)  Patient Stressors: Relationship (Fiance)  Coping Skills:   Isolation,Self-Injury,TV,Sports,Avoidance,Arguments,Aggression,Music,Meditate,Substance Abuse,Talk,Prayer,Other (Comment),Read,Hot Bath/Shower Production manager)  Leisure Interests (2+):  Individual - Reading,Individual - Other (Comment) (Watch movies/sports)  Frequency of Recreation/Participation: Weekly  Awareness of Community Resources:  Yes  Community Resources:  Park  Current Use: No  If no, Barriers?: Other (Comment) (Recent surgery on foot)  Expressed Interest in [place]: No  County of Residence:  [place]  Patient Main Form of Transportation: Musician  Patient Strengths:  Help others; Goof heart  Patient Identified Areas of Improvement:  Better verbal communication  Patient Goal for Hospitalization:  "stay on medications and get into drug rehab"  Current SI (including self-harm):  No  Current HI:  No  Current AVH: No  Staff Intervention Plan: Group Attendance,Collaborate with Interdisciplinary Treatment Team  Consent to Intern Participation: N/A     Victorino Sparrow, LRT/CTRS   Victorino Sparrow A 03/08/2020, 12:44 PM

## 2020-03-08 NOTE — Progress Notes (Signed)
Progress note    03/08/20 1205  Psych Admission Type (Psych Patients Only)  Admission Status Voluntary  Psychosocial Assessment  Patient Complaints Anxiety;Depression;Sadness;Worrying  Eye Contact Brief  Facial Expression Anxious;Sullen;Sad;Worried  Affect Anxious;Depressed;Sad;Sullen  Theatre stage manager;Unsteady  Appearance/Hygiene In scrubs  Behavior Characteristics Cooperative;Appropriate to situation;Anxious;Fidgety  Mood Depressed;Anxious;Sad;Sullen;Pleasant  Thought Process  Coherency WDL  Content Blaming self  Delusions None reported or observed  Perception Hallucinations  Hallucination Auditory;Visual  Judgment Poor  Confusion None  Danger to Self  Current suicidal ideation? Denies  Self-Injurious Behavior No self-injurious ideation or behavior indicators observed or expressed   Danger to Others  Danger to Others None reported or observed

## 2020-03-08 NOTE — Progress Notes (Signed)
Pt continues to be passive SI / AH . Pt visible in the dayroom much of the evening.     03/08/20 2100  Psych Admission Type (Psych Patients Only)  Admission Status Voluntary  Psychosocial Assessment  Patient Complaints Anxiety;Depression  Eye Contact Brief  Facial Expression Anxious;Sullen;Sad;Worried  Affect Anxious;Depressed;Sad;Sullen  Theatre stage manager;Unsteady  Appearance/Hygiene In scrubs  Behavior Characteristics Cooperative  Mood Anxious;Depressed  Thought Process  Coherency WDL  Content Blaming self  Delusions None reported or observed  Perception Hallucinations  Hallucination Auditory;Visual  Judgment Poor  Confusion None  Danger to Self  Current suicidal ideation? Passive  Self-Injurious Behavior Some self-injurious ideation observed or expressed.  No lethal plan expressed   Agreement Not to Harm Self Yes  Description of Agreement verbally agrees  Danger to Others  Danger to Others None reported or observed
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
## 2020-03-09 DIAGNOSIS — K760 Fatty (change of) liver, not elsewhere classified: Secondary | ICD-10-CM | POA: Diagnosis present

## 2020-03-09 DIAGNOSIS — F250 Schizoaffective disorder, bipolar type: Secondary | ICD-10-CM | POA: Diagnosis not present

## 2020-03-09 DIAGNOSIS — F209 Schizophrenia, unspecified: Secondary | ICD-10-CM | POA: Diagnosis not present

## 2020-03-09 MED ORDER — ROSUVASTATIN CALCIUM 20 MG PO TABS: 20.0000 mg | ORAL_TABLET | Freq: Every day | ORAL | 0 refills | Status: DC

## 2020-03-09 MED ORDER — MAGNESIUM CITRATE PO SOLN
1.0000 | Freq: Once | ORAL | Status: AC
  Administered 2020-03-09: 1 via ORAL
  Filled 2020-03-09: qty 296

## 2020-03-09 MED ORDER — ALUM & MAG HYDROXIDE-SIMETH 200-200-20 MG/5ML PO SUSP: 30.0000 mL | ORAL | Status: DC | PRN

## 2020-03-09 MED ORDER — DOCUSATE SODIUM 100 MG PO CAPS
100.0000 mg | ORAL_CAPSULE | Freq: Every day | ORAL | Status: DC
  Administered 2020-03-09 – 2020-03-10 (×2): 100 mg via ORAL
  Filled 2020-03-09 (×5): qty 1

## 2020-03-09 MED ORDER — PANTOPRAZOLE SODIUM 40 MG PO TBEC: 40.0000 mg | DELAYED_RELEASE_TABLET | Freq: Every day | ORAL | 0 refills | Status: DC

## 2020-03-09 MED ORDER — MAGNESIUM HYDROXIDE 400 MG/5ML PO SUSP: 30.0000 mL | Freq: Every day | ORAL | Status: DC | PRN

## 2020-03-09 MED ORDER — BISACODYL 5 MG PO TBEC: 5.0000 mg | DELAYED_RELEASE_TABLET | Freq: Every day | ORAL | Status: DC | PRN

## 2020-03-09 MED ORDER — BUPROPION HCL ER (XL) 150 MG PO TB24: 150.0000 mg | ORAL_TABLET | Freq: Every day | ORAL | 0 refills | Status: DC

## 2020-03-09 MED ORDER — BIKTARVY 50-200-25 MG PO TABS: 1.0000 | ORAL_TABLET | Freq: Every day | ORAL | 0 refills | Status: DC

## 2020-03-09 MED ORDER — QUETIAPINE FUMARATE 100 MG PO TABS: 100.0000 mg | ORAL_TABLET | Freq: Every day | ORAL | 0 refills | Status: DC

## 2020-03-09 MED ORDER — TRAZODONE HCL 100 MG PO TABS: 100.0000 mg | ORAL_TABLET | Freq: Every day | ORAL | 0 refills | Status: DC

## 2020-03-09 NOTE — Plan of Care (Signed)
  Problem: Activity: Goal: Interest or engagement in activities will improve Outcome: Progressing Goal: Sleeping patterns will improve Outcome: Progressing   Problem: Coping: Goal: Ability to verbalize frustrations and anger appropriately will improve Outcome: Progressing

## 2020-03-09 NOTE — Progress Notes (Signed)
Adult Psychoeducational Group Note  Date:  03/09/2020 Time:  10:33 PM  Group Topic/Focus:  Wrap-Up Group:   The focus of this group is to help patients review their daily goal of treatment and discuss progress on daily workbooks.  Participation Level:  Active  Participation Quality:  Appropriate  Affect:  Appropriate  Cognitive:  Appropriate  Insight: Appropriate  Engagement in Group:  Developing/Improving  Modes of Intervention:  Discussion  Additional Comments:  Pt stated his goal for today was to focus on his treatment plan and talk with his doctor about his aftercare plan. Pt stated he accomplished his goal today. Pt stated he was able to talk with his doctor and social worker about his care today. Pt stated he will discharge on 03/10/2020 and go to [place]. Pt rated his overall day a 10. Pt stated been able to contact his wife today improved his day. Pt stated his wife brought up clothes for him to have at [facility]. Pt stated he felt better about himself today. Pt stated his relationship with his family and support system needs to be improved. Pt stated he was able to attend all meals. Pt stated he took all medications provided today. Pt stated his appetite was pretty good today. Pt rated sleep last night was pretty good. Pt stated the goal for tonight was to get some rest. Pt stated he was no physical pain today. Pt deny auditory or visual hallucinations. Pt denies thoughts of harming himself or others. Pt stated he would alert staff if anything changes.  Candy Sledge 03/09/2020, 10:33 PM

## 2020-03-09 NOTE — Progress Notes (Signed)
Progress note    03/09/20 1040  Psych Admission Type (Psych Patients Only)  Admission Status Voluntary  Psychosocial Assessment  Patient Complaints Anxiety;Depression  Eye Contact Fair  Facial Expression Anxious;Sullen;Sad  Affect Anxious;Depressed;Sad;Sullen  Theatre stage manager;Unsteady  Appearance/Hygiene Unremarkable  Behavior Characteristics Cooperative;Appropriate to situation;Anxious  Mood Depressed;Anxious;Sad;Sullen;Pleasant  Thought Pension scheme manager thinking  Content Blaming self  Delusions None reported or observed  Perception Hallucinations  Hallucination Auditory;Visual  Judgment Poor  Confusion None  Danger to Self  Current suicidal ideation? Denies  Self-Injurious Behavior No self-injurious ideation or behavior indicators observed or expressed   Danger to Others  Danger to Others None reported or observed

## 2020-03-09 NOTE — Discharge Summary (Signed)
Physician Discharge Summary Note  Patient:  Dylan [name] is an 55 y.o., male MRN:  [id number] DOB:  March 25, 1965 Patient phone:  [phone] (home)  Patient address:   [place] [address],  Total Time spent with patient: 30 minutes  Date of Admission:  03/07/2020 Date of Discharge: 03/10/2020  Reason for Admission: (From MD's admission note):  Patient is a 55 year old male with a reported past psychiatric history significant for schizoaffective disorder who presented to the [facility] emergency department on 03/07/2020 with suicidal ideation. He stated he was trying to hang himself in the a.m. of 2/27. Surprisingly the patient had presented earlier to [facility] health urgent care on 2/26 secondary to left-sided lower back pain. Review of the electronic medical record revealed that he complained of feeling depressed, thoughts of suicide, and that he had fashioned a rope to choke himself on the day of admission. He denied attempting to harm himself. He denied any overdose. He admitted to auditory hallucinations as well as "the devil is chasing me". The patient admitted to using $80 worth of cocaine essentially on a daily basis. He also admitted to drinking "a lot" of Cognac.He stated he had been previously prescribed psychiatric medications, but was not taking them regularly. Collateral information from the patient's fianc stated that the patient has been talking about suicide often. He keeps saying that he does not want to be alive anymore. She stated that she caught him trying to hang himself with a rope on the Friday prior to admission. She also stated that he had complained of auditory hallucinations telling him to jump off a bridge, and that he had been using cocaine and marijuana. He had last seen his outpatient provider on 02/03/2020 at the [facility]. At that time he was on Seroquel, trazodone and hydroxyzine. He  stated that the Seroquel makes him drowsy during the day, and just wants to take it at night. He does have a history of HIV, and apparently has not been compliant with his HIV medications. He had last been seen at the [facility] behavioral health urgent care center for a walk-in appointment on 07/11/2019. He admitted to auditory hallucinations at that time. He has been followed by [facility] in the past, but at that time he had lost his therapist because she had gone out on maternity leave. He had apparently been prescribed Seroquel 200 mg p.o. nightly at that time. His drug screen was positive for cocaine at that time as well. He was discharged home with outpatient follow-up scheduled. He was admitted to the hospital for evaluation and stabilization.  Principal Problem: Schizoaffective disorder, bipolar type [facility]) Discharge Diagnoses: Active Problems:   Schizophrenia [facility])  Past Psychiatric History: Patient stated he had been admitted to the psychiatric hospital before, but had not been admitted to our facility.  He has been followed by [facility] as an outpatient for the last 3 years, but has not seen them in a "while".  His previous medications include Seroquel, hydroxyzine, Neurontin.  He is now being followed at the behavioral health urgent care center.  He was last seen there on 02/03/2020.  Past Medical History:  Past Medical History:  Diagnosis Date  . Anxiety   . Arthritis   . Bipolar 1 disorder ([place])   . Colon polyps   . Depression   . GERD (gastroesophageal reflux disease)   . Hepatitis B   . Human immunodeficiency virus (HIV) ([place])   .  Hyperlipidemia   . Hypertension   . Neuromuscular disorder (HCC)    neuropathy  . Neuropathy   . Pre-diabetes   . Schizophrenia [facility])     Past Surgical History:  Procedure Laterality Date  . FOOT ARTHRODESIS Right 09/17/2019   Procedure: FUSION RIGHT LISFRANC JOINT;  Surgeon: Newt Minion, MD;  Location: [facility];  Service: Orthopedics;   Laterality: Right;  . FOOT ARTHRODESIS Right 09/2019  . HEMORROIDECTOMY    . IR FLUORO GUIDE CV LINE RIGHT  02/22/2017  . IR US GUIDE VASC ACCESS RIGHT  02/22/2017  . TUMOR REMOVAL     From Chest   Family History:  Family History  Problem Relation Age of Onset  . CAD Mother   . Diabetes Father   . Colon cancer Maternal Aunt 67  . Diabetes Maternal Aunt   . Heart disease Maternal Uncle   . Esophageal cancer Neg Hx   . Rectal cancer Neg Hx   . Stomach cancer Neg Hx    Family Psychiatric  History: Unspecified psychiatric illness in mother as well as uncle. Social History:  Social History   Substance and Sexual Activity  Alcohol Use Yes   Comment: occassional use on weekends     Social History   Substance and Sexual Activity  Drug Use Yes  . Types: Cocaine, Marijuana   Comment: last use was Friday 03/05/2020    Social History   Socioeconomic History  . Marital status: Married    Spouse name: Not on file  . Number of children: 1  . Years of education: Not on file  . Highest education level: Not on file  Occupational History  . Not on file  Tobacco Use  . Smoking status: Current Every Day Smoker    Packs/day: 0.50    Years: 15.00    Pack years: 7.50    Types: Cigarettes  . Smokeless tobacco: Never Used  . Tobacco comment: has patches when he is ready to quit  Vaping Use  . Vaping Use: Some days  . Substances: Nicotine  Substance and Sexual Activity  . Alcohol use: Yes    Comment: occassional use on weekends  . Drug use: Yes    Types: Cocaine, Marijuana    Comment: last use was Friday 03/05/2020  . Sexual activity: Yes    Partners: Female    Birth control/protection: Condom    Comment: condoms given  Other Topics Concern  . Not on file  Social History Narrative  . Not on file   Social Determinants of Health   Financial Resource Strain: Not on file  Food Insecurity: Not on file  Transportation Needs: Not on file  Physical Activity: Not on file  Stress:  Not on file  Social Connections: Not on file    Hospital Course:  After the above admission evaluation, Zamari's presenting symptoms were noted. He was recommended for mood stabilization treatments. The medication regimen targeting those presenting symptoms were discussed with him/her & initiated with his consent. His UDS on arrival to the ED was positive for cocaine and THC. Alcohol level was negative. He was medicated, stabilized & discharged on the medications as listed on his discharge medication lists below. Besides the mood stabilization treatments, Treshawn was also enrolled & participated in the group counseling sessions being offered & held on this unit. He has been working on Radiographer, therapeutic. He presented no other significant pre-existing medical issues that required treatment. He tolerated his treatment regimen without any adverse effects or  reactions reported.   During the course of his hospitalization, the 15-minute checks were adequate to ensure patient's safety. Chao did not display any dangerous, violent or suicidal behavior on the unit.  He interacted with patients & staff appropriately, participated appropriately in the group sessions/therapies. His medications were addressed & adjusted to meet his/her needs. He was recommended for outpatient follow-up care & medication management upon discharge to assure continuity of care & mood stability.  At the time of discharge patient is not reporting any acute suicidal/homicidal ideations. He feels more confident about his/her self-care & in managing his mental health. He currently denies any new issues or concerns. Education and supportive counseling provided throughout his hospital stay & upon discharge.   Today upon his/her discharge evaluation with the attending psychiatrist, Grier shares he is doing well. He denies any other specific concerns. He is sleeping well. His appetite is good. He denies other physical complaints. He denies AH/VH,  delusional thoughts or paranoia. He does not appear to be responding to any internal stimuli. He feels that his medications have been helpful & is in agreement to continue his current treatment regimen as recommended. He was able to engage in safety planning including plan to return to [facility] or contact emergency services if he feels unable to maintain his/her own safety or the safety of others. Pt had no further questions, comments, or concerns. He left [facility] with all personal belongings in no apparent distress. Transportation per [employer]. He will be transported to [facility] treatment facility for continued substance abuse treatment.   Physical Findings: AIMS:  , ,  ,  ,    CIWA:    COWS:     Musculoskeletal: Strength & Muscle Tone: within normal limits Gait & Station: normal Patient leans: N/A  Psychiatric Specialty Exam: See MD's discharge SRA Physical Exam Constitutional:      Appearance: Normal appearance.  HENT:     Head: Normocephalic.  Musculoskeletal:        General: Normal range of motion.     Cervical back: Normal range of motion.  Neurological:     Mental Status: He is alert and oriented to person, place, and time.  Psychiatric:        Attention and Perception: Attention normal.        Mood and Affect: Mood is depressed.        Speech: Speech normal.        Behavior: Behavior normal. Behavior is cooperative.        Thought Content: Thought content is not paranoid or delusional. Thought content does not include homicidal or suicidal ideation. Thought content does not include suicidal plan.        Cognition and Memory: Cognition normal.     Review of Systems  Constitutional: Negative for activity change and appetite change.  Respiratory: Negative for chest tightness and shortness of breath.   Cardiovascular: Negative for chest pain.  Gastrointestinal: Negative for abdominal pain.  Neurological: Negative for facial asymmetry and headaches.    Blood  pressure 110/68, pulse 79, temperature 97.6 F (36.4 C), temperature source Oral, resp. rate 18, height '6\' 2"'$  (1.88 m), weight 104.8 kg, SpO2 99 %.Body mass index is 29.66 kg/m.  Sleep:  Number of Hours: 6.5     Have you used any form of tobacco in the last 30 days? (Cigarettes, Smokeless Tobacco, Cigars, and/or Pipes): Yes  Has this patient used any form of tobacco in the last 30 days? (Cigarettes, Smokeless Tobacco, Cigars,  and/or Pipes) Yes, Yes, A prescription for an FDA-approved tobacco cessation medication was offered at discharge and the patient refused  Blood Alcohol level:  Lab Results  Component Value Date   [facility] <10 03/07/2020   ETH <10 [id number]    Metabolic Disorder Labs:  Lab Results  Component Value Date   HGBA1C 6.0 (H) 03/08/2020   MPG 125.5 03/08/2020   MPG 122.63 11/09/2019   Lab Results  Component Value Date   PROLACTIN 2.6 (L) 11/09/2019   Lab Results  Component Value Date   CHOL 254 (H) 03/08/2020   TRIG 204 (H) 03/08/2020   HDL 41 03/08/2020   CHOLHDL 6.2 03/08/2020   VLDL 41 (H) 03/08/2020   LDLCALC 172 (H) 03/08/2020   LDLCALC 146 (H) 11/09/2019    See Psychiatric Specialty Exam and Suicide Risk Assessment completed by Attending Physician prior to discharge.  Discharge destination:  [facility]  Is patient on multiple antipsychotic therapies at discharge:  No   Has Patient had three or more failed trials of antipsychotic monotherapy by history:  No  Recommended Plan for Multiple Antipsychotic Therapies: NA  Discharge Instructions    Diet - low sodium heart healthy   Complete by: As directed    Increase activity slowly   Complete by: As directed      Allergies as of 03/09/2020      Reactions   Penicillins Other (See Comments)   Childhood allergy   Latex Rash   Tape Rash      Medication List    STOP taking these medications   gabapentin 300 MG capsule Commonly known as: NEURONTIN   hydrOXYzine 25 MG tablet Commonly  known as: ATARAX/VISTARIL   meloxicam 7.5 MG tablet Commonly known as: Mobic   methocarbamol 500 MG tablet Commonly known as: ROBAXIN   omeprazole 20 MG capsule Commonly known as: PRILOSEC Replaced by: pantoprazole 40 MG tablet     TAKE these medications     Indication  Biktarvy 50-200-25 MG Tabs tablet Generic drug: bictegravir-emtricitabine-tenofovir AF Take 1 tablet by mouth daily. Start taking on: March 10, 2020  Indication: HIV Disease   buPROPion 150 MG 24 hr tablet Commonly known as: WELLBUTRIN XL Take 1 tablet (150 mg total) by mouth daily.  Indication: Major Depressive Disorder   pantoprazole 40 MG tablet Commonly known as: PROTONIX Take 1 tablet (40 mg total) by mouth daily. Replaces: omeprazole 20 MG capsule  Indication: Gastroesophageal Reflux Disease   QUEtiapine 100 MG tablet Commonly known as: SEROQUEL Take 1 tablet (100 mg total) by mouth at bedtime. What changed:   medication strength  how much to take  when to take this  Indication: Major Depressive Disorder   rosuvastatin 20 MG tablet Commonly known as: CRESTOR Take 1 tablet (20 mg total) by mouth daily.  Indication: High Amount of Fats in the Blood   traZODone 100 MG tablet Commonly known as: DESYREL Take 1 tablet (100 mg total) by mouth at bedtime.  Indication: [place] Follow up.   Why: sees Dr. Denita Lung information: [place] [phone] [place] Follow up on 03/16/2020.   Specialty: Behavioral Health Why: You have an appointment for therapy on 03/16/20 at 2:00 pm.  You also have anappointment for medication management on 03/17/20 at 11:00 am. These will be Virtual appointments. Contact  information: [place] [phone]              Follow-up recommendations:   Activity:  as tolerated Diet:  Heart healthy  Comments:  Prescriptions given at discharge. Patient received a 14 day sample of his medications per [facility] requirements for admission.  Patient agreeable to plan.  Given opportunity to ask questions.  Appears to feel comfortable with discharge denies any current suicidal or homicidal thoughts.   Patient is instructed prior to discharge to: Take all medications as prescribed by his/her mental healthcare provider. Report any adverse effects and or reactions from the medicines to his/her outpatient provider promptly. Patient has been instructed & cautioned: To not engage in alcohol and or illegal drug use while on prescription medicines. In the event of worsening symptoms, patient is instructed to call the crisis hotline, 911 and or go to the nearest ED for appropriate evaluation and treatment of symptoms. To follow-up with his/her primary care provider for your other medical issues, concerns and or health care needs.  Signed: Ethelene Hal, NP 03/10/2020, 11:04 AM
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
## 2020-03-09 NOTE — BHH Suicide Risk Assessment (Signed)
[facility] Discharge Suicide Risk Assessment   Principal Problem: <principal problem not specified> Discharge Diagnoses: Active Problems:   Schizophrenia [facility])  Patient is a 55 year old male with a longstanding history of schizoaffective disorder who was transferred from [place] long ED for stabilization and treatment along with helping him with his suicidal ideation.  Patient reports that he is participating in groups, taking his medications as prescribed, as that his mood is improved, denies any hallucinations, any thoughts of self-harm or harm to others.  Patient states that he does want to going to residential treatment, is happy that he is gotten accepted. Total Time spent with patient: 30 minutes  Musculoskeletal: Strength & Muscle Tone: within normal limits Gait & Station: normal Patient leans: N/A  Psychiatric Specialty Exam: Review of Systems  Blood pressure 110/68, pulse 79, temperature 97.6 F (36.4 C), temperature source Oral, resp. rate 18, height '6\' 2"'$  (1.88 m), weight 104.8 kg, SpO2 99 %.Body mass index is 29.66 kg/m.  General Appearance: Casual  Eye Contact::  Fair  Speech:  Clear and Coherent and Normal Rate409  Volume:  Normal  Mood:  Euthymic  Affect:  Appropriate and Congruent  Thought Process:  Coherent, Goal Directed and Descriptions of Associations: Intact  Orientation:  Full (Time, Place, and Person)  Thought Content:  Logical and Rumination  Suicidal Thoughts:  No  Homicidal Thoughts:  No  Memory:  Immediate;   Fair Recent;   Fair Remote;   Fair  Judgement:  Intact  Insight:  Fair  Psychomotor Activity:  Normal  Concentration:  Fair  Recall:  [employer] of Knowledge:Fair  Language: Fair  Akathisia:  No  Handed:  Right  AIMS (if indicated):     Assets:  Desire for Improvement Housing Social Support  Sleep:  Number of Hours: 6.5  Cognition: WNL  ADL's:  Intact   Mental Status Per Nursing Assessment::   On Admission:  Suicidal ideation indicated by  patient,Self-harm thoughts,Suicide plan  Demographic Factors:  Male  Loss Factors: Loss of significant relationship and Decline in physical health  Historical Factors: Family history of mental illness or substance abuse  Risk Reduction Factors:   Positive social support  Continued Clinical Symptoms:  Alcohol/Substance Abuse/Dependencies More than one psychiatric diagnosis Previous Psychiatric Diagnoses and Treatments Medical Diagnoses and Treatments/Surgeries  Cognitive Features That Contribute To Risk:  None    Suicide Risk:  Minimal: No identifiable suicidal ideation.  Patients presenting with no risk factors but with morbid ruminations; may be classified as minimal risk based on the severity of the depressive symptoms   Follow-up [place] Follow up.   Why: sees Dr. Denita Lung information: [place] [phone] [place] Follow up on 03/16/2020.   Specialty: Behavioral Health Why: You have an appointment for therapy on 03/16/20 at 2:00 pm.  You also have anappointment for medication management on 03/17/20 at 11:00 am. These will be Virtual appointments. Contact information: [place] [phone]              Plan Of Care/Follow-up recommendations:  Activity:  as tolerated Diet:  Heart healthy diet Other:  Patient to attend the residential program  Hampton Abbot, MD 03/09/2020, 3:24 PM

## 2020-03-09 NOTE — BHH Counselor (Signed)
This patient has been accepted to [facility] and can arrive on Wednesday 03/11/19 by [place] MSW, [place] Worker  [facility]

## 2020-03-09 NOTE — BHH Suicide Risk Assessment (Signed)
Dylan [name] INPATIENT:  Family/Significant Other Suicide Prevention Education    Suicide Prevention Education: Education Completed; Dylan [name] Friday [phone]), has been identified by the patient as the family member/significant other with whom the patient will be residing, and identified as the person(s) who will aid the patient in the event of a mental health crisis (suicidal ideations/suicide attempt).  With written consent from the patient, the family member/significant other has been provided the following suicide prevention education, prior to the and/or following the discharge of the patient.  The suicide prevention education provided includes the following:  Suicide risk factors  Suicide prevention and interventions  National Suicide Hotline telephone number  [facility] assessment telephone number  [facility] Emergency Assistance [place] and/or Residential Mobile Crisis Unit telephone number   Request made of family/significant other to:  Remove weapons (e.g., guns, rifles, knives), all items previously/currently identified as safety concern.    Remove drugs/medications (over-the-counter, prescriptions, illicit drugs), all items previously/currently identified as a safety concern.   The family member/significant other verbalizes understanding of the suicide prevention education information provided.  The family member/significant other agrees to remove the items of safety concern listed above.   CSW spoke with this pt's fiance who stated this patient has had a lot of stress recently. She stated he has been struggling to find a job and has been turned down due to his legal history. He lost his daughter in [id number] and the anniversary of this is coming up. He also has been concerned about his mother who recently moved to [facility].   Pt's fiance is agreeable to discharge plans of this patient discharging to rehab.    Dylan [name] MSW, LCSW Clincal  Social Worker  [facility]

## 2020-03-09 NOTE — Progress Notes (Signed)
   03/09/20 0500  Sleep  Number of Hours 6.5

## 2020-03-09 NOTE — Progress Notes (Signed)
Recreation Therapy Notes  Date: 03/09/2020 Time: 10:00am Location: [address] Dayroom                                                   Group Topic/Focus: Emotional Expression    Goal Area(s) Addresses:  Patient will be able to identify a variety of emotions.. Patient will successfully share benefits of healthy expression of emotions. Patient will successfully follow instructions on 1st prompt.      Behavioral Response: Attentive, Interactive   Intervention: Artistic Self-expression   Activity: Emotions in Color. Patient provided a simplistic emotions worksheet, displaying 16 blank squares with varying emotions labeled beside them (for example happiness, sadness, anxiety, love and pride). Patient was asked to identify a color they felt represented each emotion listed. Patient was then provided the 'Emotions Within Me' worksheet, that has a large blank heart on it. Patients were asked to fill in the heart shaped worksheet in the colors identifies on the emotions worksheet to represent the colors within them. Patients were given colored pencils and markers to complete the assignment. Patients were given the opportunity to share their completed assignment with each other. Patients were debriefed on the concept of having accurate words to described their emotions, and knowing what makes them feel a certain way so they can communicate effectively with others.   Education: Interior and spatial designer, Communication, Discharge planning   Education Outcome: Acknowledges understanding  Clinical Observations/Feedback: Pt joined session late endorsing constipation. LRT empathized with pt and encouraged them to consult with RN. Pt accepted recommendation and left to speak with staff at nursing station. Pt returned to dayroom and was attentive to group topic, openly contributing to facilitated discussion of emotional wellness and coping skills to address negative feelings. Pt explained that they are challenged  by "impatience" particularly with their significant other a thome. Pt partially finished art activity and was left crayons in the dayroom if interested in independent completion for further self-expression.     Fabiola Backer, LRT/CTRS Bjorn Loser Blessed Girdner 03/09/2020, 3:40 PM

## 2020-03-09 NOTE — Progress Notes (Signed)
[facility] MD Progress Note  03/09/2020 2:39 PM Dylan [name]  MRN:  [id number] Subjective:  "I feel much better today. I am constipated but I took a laxative."   Objective: Dylan [name] is a 55 year old male with a reported past psychiatric history significant for schizoaffective disorder who presented to the [facility] emergency department on 03/07/2020 with suicidal ideation and a plan to hang himself. Patient had also presented to [facility] urgent care on 2/26 secondary to left-sided lower back pain. Review of the electronic medical record revealed that he complained of feeling depressed, thoughts of suicide, and that he had fashioned a rope to choke himself on the day of admission. He denied attempting to harm himself. He denied any overdose. He admitted to auditory hallucinations as well as "the devil is chasing me". The patient admitted to using $80 worth of cocaine essentially on a daily basis. He also admitted to drinking "a lot" of Cogn[place].He stated he had been previously prescribed psychiatric medications, but was not taking them regularly.  Today, patient was seen and evaluated, chart reviewed and case discussed with the treatment team. Patient stated he slept poorly last night because the pillow was not right. He stated his appetite is good. He stated he is constipated but did get a laxative today. He denies SI/HI/AVH, paranoia and delusions. He is taking his medications without complaint of side effects. He is attending group therapy and interacting appropriately with staff and peers. He is interested in residential substance abuse treatment for his cocaine abuse. He has been offered abed a [facility] for 03/10/20. Encouragement and support provided.  Principal Problem: <principal problem not specified> Diagnosis: Active Problems:   Schizophrenia ([place])  Total Time spent with patient: 25 minutes  Past Psychiatric History: Patient stated he had been admitted to the  psychiatric hospital before, but had not been admitted to our facility.  He has been followed by [facility] as an outpatient for the last 3 years, but has not seen them in a "while".  His previous medications include Seroquel, hydroxyzine, Neurontin.  He is now being followed at the behavioral health urgent care center.  He was last seen there on 02/03/2020.   Past Medical History:  Past Medical History:  Diagnosis Date  . Anxiety   . Arthritis   . Bipolar 1 disorder ([place])   . Colon polyps   . Depression   . GERD (gastroesophageal reflux disease)   . Hepatitis B   . Human immunodeficiency virus (HIV) ([place])   . Hyperlipidemia   . Hypertension   . Neuromuscular disorder (HCC)    neuropathy  . Neuropathy   . Pre-diabetes   . Schizophrenia [facility])     Past Surgical History:  Procedure Laterality Date  . FOOT ARTHRODESIS Right 09/17/2019   Procedure: FUSION RIGHT LISFRANC JOINT;  Surgeon: Newt Minion, MD;  Location: [facility];  Service: Orthopedics;  Laterality: Right;  . FOOT ARTHRODESIS Right 09/2019  . HEMORROIDECTOMY    . IR FLUORO GUIDE CV LINE RIGHT  02/22/2017  . IR US GUIDE VASC ACCESS RIGHT  02/22/2017  . TUMOR REMOVAL     From Chest   Family History:  Family History  Problem Relation Age of Onset  . CAD Mother   . Diabetes Father   . Colon cancer Maternal Aunt 12  . Diabetes Maternal Aunt   . Heart disease Maternal Uncle   . Esophageal cancer Neg Hx   . Rectal cancer Neg Hx   .  Stomach cancer Neg Hx    Family Psychiatric  History: Unspecified psychiatric illness in mother as well as uncle. Social History:  Social History   Substance and Sexual Activity  Alcohol Use Yes   Comment: occassional use on weekends     Social History   Substance and Sexual Activity  Drug Use Yes  . Types: Cocaine, Marijuana   Comment: last use was Friday 03/05/2020    Social History   Socioeconomic History  . Marital status: Married    Spouse name: Not on file  . Number of children:  1  . Years of education: Not on file  . Highest education level: Not on file  Occupational History  . Not on file  Tobacco Use  . Smoking status: Current Every Day Smoker    Packs/day: 0.50    Years: 15.00    Pack years: 7.50    Types: Cigarettes  . Smokeless tobacco: Never Used  . Tobacco comment: has patches when he is ready to quit  Vaping Use  . Vaping Use: Some days  . Substances: Nicotine  Substance and Sexual Activity  . Alcohol use: Yes    Comment: occassional use on weekends  . Drug use: Yes    Types: Cocaine, Marijuana    Comment: last use was Friday 03/05/2020  . Sexual activity: Yes    Partners: Female    Birth control/protection: Condom    Comment: condoms given  Other Topics Concern  . Not on file  Social History Narrative  . Not on file   Social Determinants of Health   Financial Resource Strain: Not on file  Food Insecurity: Not on file  Transportation Needs: Not on file  Physical Activity: Not on file  Stress: Not on file  Social Connections: Not on file   Additional Social History:    Sleep: Fair  Appetite:  Good  Current Medications: Current Facility-Administered Medications  Medication Dose Route Frequency Provider Last Rate Last Admin  . acetaminophen (TYLENOL) tablet 650 mg  650 mg Oral Q6H PRN Aj[place]ola, Ene A, NP   650 mg at 03/08/20 1205  . alum & mag hydroxide-simeth (MAALOX/MYLANTA) 200-200-20 MG/5ML suspension 30 mL  30 mL Oral Q4H PRN Ethelene Hal, NP      . bictegravir-emtricitabine-tenofovir AF (BIKTARVY) 50-200-25 MG per tablet 1 tablet  1 tablet Oral Daily Prescilla Sours, PA-C   1 tablet at 03/09/20 1036  . bisacodyl (DULCOLAX) EC tablet 5 mg  5 mg Oral Daily PRN Ethelene Hal, NP      . buPROPion (WELLBUTRIN XL) 24 hr tablet 150 mg  150 mg Oral Daily Margorie John W, PA-C   150 mg at 03/08/20 2105  . docusate sodium (COLACE) capsule 100 mg  100 mg Oral Daily Ethelene Hal, NP   100 mg at 03/09/20 1130  .  folic acid (FOLVITE) tablet 1 mg  1 mg Oral Daily Sharma Covert, MD   1 mg at 03/09/20 1036  . gabapentin (NEURONTIN) capsule 600 mg  600 mg Oral BID Prescilla Sours, PA-C   600 mg at 03/09/20 1036  . hydrOXYzine (ATARAX/VISTARIL) tablet 25 mg  25 mg Oral TID PRN Prescilla Sours, PA-C      . LORazepam (ATIVAN) tablet 1 mg  1 mg Oral Q6H PRN Sharma Covert, MD      . magnesium hydroxide (MILK OF MAGNESIA) suspension 30 mL  30 mL Oral Daily PRN Ethelene Hal, NP      .  meloxicam (MOBIC) tablet 7.5 mg  7.5 mg Oral Daily Margorie John W, PA-C   7.5 mg at 03/09/20 1036  . methocarbamol (ROBAXIN) tablet 500 mg  500 mg Oral BID PRN Prescilla Sours, PA-C   500 mg at 03/08/20 0030  . nicotine polacrilex (NICORETTE) gum 2 mg  2 mg Oral PRN Prescilla Sours, PA-C   2 mg at 03/09/20 1131  . pantoprazole (PROTONIX) EC tablet 40 mg  40 mg Oral Daily Prescilla Sours, PA-C   40 mg at 03/08/20 2105  . QUEtiapine (SEROQUEL) tablet 100 mg  100 mg Oral QHS Sharma Covert, MD   100 mg at 03/08/20 2105  . rosuvastatin (CRESTOR) tablet 20 mg  20 mg Oral Daily Margorie John W, PA-C   20 mg at 03/09/20 1036  . thiamine tablet 100 mg  100 mg Oral Daily Sharma Covert, MD   100 mg at 03/09/20 1036  . traZODone (DESYREL) tablet 100 mg  100 mg Oral QHS Margorie John W, PA-C   100 mg at 03/08/20 2105    Lab Results:  Results for orders placed or performed during the hospital encounter of 03/07/20 (from the past 48 hour(s))  Lipid panel     Status: Abnormal   Collection Time: 03/08/20  6:05 PM  Result Value Ref Range   Cholesterol 254 (H) 0 - 200 mg/dL   Triglycerides 204 (H) <150 mg/dL   HDL 41 >40 mg/dL   Total CHOL/HDL Ratio 6.2 RATIO   VLDL 41 (H) 0 - 40 mg/dL   LDL Cholesterol 172 (H) 0 - 99 mg/dL    Comment:        Total Cholesterol/HDL:CHD Risk Coronary Heart Disease Risk Table                     Men   Women  1/2 Average Risk   3.4   3.3  Average Risk       5.0   4.4  2 X Average Risk   9.6    7.1  3 X Average Risk  23.4   11.0        Use the calculated Patient Ratio above and the CHD Risk Table to determine the patient's CHD Risk.        ATP III CLASSIFICATION (LDL):  <100     mg/dL   Optimal  100-129  mg/dL   Near or Above                    Optimal  130-159  mg/dL   Borderline  160-189  mg/dL   High  >190     mg/dL   Very High Performed at [facility] [address] [place], [place] [address]   Hemoglobin A1c     Status: Abnormal   Collection Time: 03/08/20  6:05 PM  Result Value Ref Range   Hgb A1c MFr Bld 6.0 (H) 4.8 - 5.6 %    Comment: (NOTE) Pre diabetes:          5.7%-6.4%  Diabetes:              >6.4%  Glycemic control for   <7.0% adults with diabetes    Mean Plasma Glucose 125.5 mg/dL    Comment: Performed at [facility] [address] [place], [place] [address]  TSH     Status: None   Collection Time: 03/08/20  6:05 PM  Result Value Ref  Range   TSH 1.727 0.350 - 4.500 uIU/mL    Comment: Performed by a 3rd Generation assay with a functional sensitivity of <=0.01 uIU/mL. Performed at [facility], [place] [address], [place], [place] [address]     Blood Alcohol level:  Lab Results  Component Value Date   [facility] <10 03/07/2020   ETH <10 [id number]    Metabolic Disorder Labs: Lab Results  Component Value Date   HGBA1C 6.0 (H) 03/08/2020   MPG 125.5 03/08/2020   MPG 122.63 11/09/2019   Lab Results  Component Value Date   PROLACTIN 2.6 (L) 11/09/2019   Lab Results  Component Value Date   CHOL 254 (H) 03/08/2020   TRIG 204 (H) 03/08/2020   HDL 41 03/08/2020   CHOLHDL 6.2 03/08/2020   VLDL 41 (H) 03/08/2020   LDLCALC 172 (H) 03/08/2020   LDLCALC 146 (H) 11/09/2019    Physical Findings: AIMS:  , ,  ,  ,    CIWA:    COWS:     Musculoskeletal: Strength & Muscle Tone: within normal limits Gait & Station: normal Patient leans: N/A  Psychiatric Specialty Exam: Physical  Exam Constitutional:      Appearance: Normal appearance.  HENT:     Head: Normocephalic.  Musculoskeletal:        General: Normal range of motion.     Cervical back: Normal range of motion.  Neurological:     Mental Status: He is alert and oriented to person, place, and time.  Psychiatric:        Attention and Perception: Attention normal.        Mood and Affect: Mood is anxious and depressed.        Speech: Speech normal.        Behavior: Behavior normal. Behavior is cooperative.        Thought Content: Thought content is not paranoid or delusional. Thought content does not include homicidal or suicidal ideation. Thought content does not include homicidal or suicidal plan.        Cognition and Memory: Cognition normal.     Review of Systems  Blood pressure 110/68, pulse 79, temperature 97.6 F (36.4 C), temperature source Oral, resp. rate 18, height '6\' 2"'$  (1.88 m), weight 104.8 kg, SpO2 99 %.Body mass index is 29.66 kg/m.  General Appearance: Disheveled  Eye Contact:  Fair  Speech:  Clear and Coherent and Normal Rate  Volume:  Normal  Mood:  Anxious and Depressed  Affect:  Congruent and Depressed  Thought Process:  Coherent and Descriptions of Associations: Intact  Orientation:  Full (Time, Place, and Person)  Thought Content:  Logical and Hallucinations: None, denies  Suicidal Thoughts:  No, denies  Homicidal Thoughts:  denies  Memory:  Immediate;   Fair Recent;   Fair Remote;   Fair  Judgement:  Intact  Insight:  Fair  Psychomotor Activity:  Normal  Concentration:  Concentration: Fair and Attention Span: Fair  Recall:  [employer] of Knowledge:  Fair  Language:  Good  Akathisia:  No  Handed:  Right  AIMS (if indicated):     Assets:  Communication Skills Desire for Improvement Resilience  ADL's:  Intact  Cognition:  WNL  Sleep:  Number of Hours: 6.5     Treatment Plan Summary: Daily contact with patient to assess and evaluate symptoms and progress in  treatment and Medication management   Schizoaffective Disorder:  Seroquel 100 mg PO  At bedtime Wellbutrin XL 150 mg PO  daily  HIV: Biktarvy 50-200-25 mg PO daily  Hyperlipidemia: Crestor 20 mg PO daily  Cocaine abuse/withdrawal: Gabapentin 600 mg PO twice daily  Insomnia:  Trazodone 100 mg PO at bedtime  GERD:  Protonix 40 mg PO daily   15 minute safety checks Encourage participation in group therapy Discharge 03/10/2020 to [place], NP 03/09/2020, 2:39 PM

## 2020-03-09 NOTE — Progress Notes (Signed)
   03/09/20 2000  Psych Admission Type (Psych Patients Only)  Admission Status Voluntary  Psychosocial Assessment  Patient Complaints Anxiety;Depression  Eye Contact Fair  Facial Expression Anxious;Sullen;Sad  Affect Anxious;Depressed;Sad;Sullen  Theatre stage manager;Unsteady  Appearance/Hygiene Unremarkable  Behavior Characteristics Cooperative;Appropriate to situation  Mood Depressed;Anxious  Thought Process  Coherency Concrete thinking  Content Blaming self  Delusions None reported or observed  Perception Hallucinations  Hallucination Auditory;Visual  Judgment Poor  Confusion None  Danger to Self  Current suicidal ideation? Denies  Self-Injurious Behavior No self-injurious ideation or behavior indicators observed or expressed   Danger to Others  Danger to Others None reported or observed

## 2020-03-10 LAB — PROLACTIN: Prolactin: 12.1 ng/mL (ref 4.0–15.2)

## 2020-03-10 NOTE — Progress Notes (Signed)
D: Pt A & O X 3. Denies SI, HI, AVH and pain at this time. D/C to [facility] (rehab) as ordered. Picked up in front of facility by [employer]. A: D/C instructions reviewed with pt including prescriptions, medication samples. Compliance encouraged. All belongings from lockers 7 & 10 given to pt at time of departure. Scheduled medications given with verbal education and effects monitored. Safety checks maintained without incident till time of d/c.  R: Pt receptive to care. Compliant with medications when offered. Denies adverse drug reactions when assessed. Verbalized understanding related to d/c instructions. Signed belonging sheet in agreement with items received from locker. Ambulatory with a steady gait. Appears to be in no physical distress at time of departure.

## 2020-03-10 NOTE — Plan of Care (Signed)
  Problem: Communication Goal: STG - Patient will identify 2 ways of improving communication post d/c within 5 recreation therapy group sessions Description: STG - Patient will identify 2 ways of improving communication post d/c within 5 recreation therapy group sessions 03/10/2020 1453 by Oron Westrup, Bjorn Loser, LRT Outcome: Adequate for Discharge 03/10/2020 1450 by Chrisopher Pustejovsky, Bjorn Loser, LRT Outcome: Adequate for Discharge Note: Pt attended recreation therapy group session offered on unit x1. Pt receptive to education and activity addressing emotional expression and effective ways to communicate needs and feelings to others within their support system. Pt progressing toward goal at time of discharge.

## 2020-03-10 NOTE — Progress Notes (Signed)
   03/10/20 0500  Sleep  Number of Hours 6.25

## 2020-03-10 NOTE — Progress Notes (Signed)
  [facility] Adult Case Management Discharge Plan :  Will you be returning to the same living situation after discharge:  No. Will be transferring to rehab At discharge, do you have transportation home?: No. Safe Transport to be arranged Do you have the ability to pay for your medications: Yes,  has insurance  Release of information consent forms completed and in the chart;  Patient's signature needed at discharge.  Patient to Follow up at:  Follow-up [facility] Follow up.   Why: sees Dr. Denita Lung information: [place] [phone] [place] Follow up on 03/16/2020.   Specialty: Behavioral Health Why: You have an appointment for therapy on 03/16/20 at 2:00 pm.  You also have anappointment for medication management on 03/17/20 at 11:00 am. These will be Virtual appointments. Contact information: [place] [phone]              Next level of care provider has access to [place] and Suicide Prevention discussed: Yes,  with fiance  Have you used any form of tobacco in the last 30 days? (Cigarettes, Smokeless Tobacco, Cigars, and/or Pipes): Yes  Has patient been referred to the Quitline?: Patient refused referral  Patient has been referred for addiction treatment: Yes  Vassie Moselle, LCSW 03/10/2020, 8:27 AM

## 2020-03-10 NOTE — Progress Notes (Signed)
Recreation Therapy Notes  INPATIENT RECREATION TR PLAN  Patient Details Name: Dylan [name] MRN: [id number] DOB: 04-17-1965 Today's Date: 03/10/2020  Rec Therapy Plan Is patient appropriate for Therapeutic Recreation?: Yes Treatment times per week: about 3 days Estimated Length of Stay: 5-7 days TR Treatment/Interventions: Group participation (Comment)  Discharge Criteria Pt will be discharged from therapy if:: Discharged Treatment plan/goals/alternatives discussed and agreed upon by:: Patient/family  Discharge Summary Short term goals set: Patient will identify 2 ways of improving communication post d/c within 5 recreation therapy group sessions Short term goals met: Adequate for discharge Progress toward goals comments: Groups attended Which groups?: Other (Comment) (Emotion Expression) Reason goals not met: N/A; Refer to LRT plan of care note Therapeutic equipment acquired: None Reason patient discharged from therapy: Discharge from hospital Pt/family agrees with progress & goals achieved: Yes Date patient discharged from therapy: 03/10/20   Fabiola Backer, LRT/CTRS Bjorn Loser Horner 03/10/2020, 2:55 PM

## 2020-03-12 ENCOUNTER — Telehealth: Payer: Self-pay | Admitting: Family Medicine

## 2020-03-12 MED ORDER — GABAPENTIN 300 MG PO CAPS: 300.0000 mg | ORAL_CAPSULE | Freq: Every day | ORAL | 3 refills | Status: DC

## 2020-03-12 NOTE — Telephone Encounter (Signed)
Done

## 2020-03-12 NOTE — Telephone Encounter (Signed)
Pt requesting Gabapentin refill.

## 2020-03-12 NOTE — Telephone Encounter (Signed)
Copied from [place] [id number]. Topic: General - Other >> Mar 11, 2020  9:44 AM Tessa Lerner A wrote: Silva Bandy with [facility] has reached out on behalf of patient regarding medication   Patient would like to be represcribed gabapentin (NEURONTIN) 300 MG capsule which has been discontinued by the hospital  If this is prescribed, it is requested that the prescription be sent to [facility] in [facility]  Please contact to advise

## 2020-03-12 NOTE — Telephone Encounter (Signed)
Pt was called and a VM was left  informing pt of medication being sent to pharmacy

## 2020-03-15 ENCOUNTER — Ambulatory Visit: Payer: Self-pay | Admitting: Internal Medicine

## 2020-03-16 ENCOUNTER — Other Ambulatory Visit: Payer: Self-pay

## 2020-03-16 ENCOUNTER — Ambulatory Visit (HOSPITAL_COMMUNITY): Payer: Medicaid Other | Admitting: Behavioral Health

## 2020-03-17 ENCOUNTER — Ambulatory Visit: Payer: Medicaid Other | Attending: Family Medicine | Admitting: Family Medicine

## 2020-03-17 ENCOUNTER — Telehealth (INDEPENDENT_AMBULATORY_CARE_PROVIDER_SITE_OTHER): Payer: Medicaid Other | Admitting: Physician Assistant

## 2020-03-17 ENCOUNTER — Other Ambulatory Visit: Payer: Self-pay | Admitting: Family Medicine

## 2020-03-17 ENCOUNTER — Encounter: Payer: Self-pay | Admitting: Family Medicine

## 2020-03-17 ENCOUNTER — Other Ambulatory Visit: Payer: Self-pay

## 2020-03-17 VITALS — BP 115/69 | HR 76 | Ht 74.0 in | Wt 244.4 lb

## 2020-03-17 DIAGNOSIS — Z888 Allergy status to other drugs, medicaments and biological substances status: Secondary | ICD-10-CM | POA: Diagnosis not present

## 2020-03-17 DIAGNOSIS — F99 Mental disorder, not otherwise specified: Secondary | ICD-10-CM

## 2020-03-17 DIAGNOSIS — Z880 Allergy status to penicillin: Secondary | ICD-10-CM | POA: Diagnosis not present

## 2020-03-17 DIAGNOSIS — F1910 Other psychoactive substance abuse, uncomplicated: Secondary | ICD-10-CM | POA: Diagnosis not present

## 2020-03-17 DIAGNOSIS — G8929 Other chronic pain: Secondary | ICD-10-CM | POA: Insufficient documentation

## 2020-03-17 DIAGNOSIS — E782 Mixed hyperlipidemia: Secondary | ICD-10-CM | POA: Diagnosis not present

## 2020-03-17 DIAGNOSIS — G629 Polyneuropathy, unspecified: Secondary | ICD-10-CM | POA: Diagnosis not present

## 2020-03-17 DIAGNOSIS — Z981 Arthrodesis status: Secondary | ICD-10-CM | POA: Diagnosis not present

## 2020-03-17 DIAGNOSIS — F5105 Insomnia due to other mental disorder: Secondary | ICD-10-CM

## 2020-03-17 DIAGNOSIS — F250 Schizoaffective disorder, bipolar type: Secondary | ICD-10-CM

## 2020-03-17 DIAGNOSIS — Z79899 Other long term (current) drug therapy: Secondary | ICD-10-CM | POA: Diagnosis not present

## 2020-03-17 DIAGNOSIS — Z21 Asymptomatic human immunodeficiency virus [HIV] infection status: Secondary | ICD-10-CM | POA: Insufficient documentation

## 2020-03-17 DIAGNOSIS — M25562 Pain in left knee: Secondary | ICD-10-CM | POA: Insufficient documentation

## 2020-03-17 DIAGNOSIS — F411 Generalized anxiety disorder: Secondary | ICD-10-CM | POA: Diagnosis not present

## 2020-03-17 MED ORDER — GABAPENTIN 300 MG PO CAPS: 300.0000 mg | ORAL_CAPSULE | Freq: Every day | ORAL | 3 refills | Status: DC

## 2020-03-17 MED ORDER — GABAPENTIN 300 MG PO CAPS
ORAL_CAPSULE | ORAL | 0 refills | Status: DC
Start: 2020-03-17 — End: 2020-06-22

## 2020-03-17 MED ORDER — TRAZODONE HCL 150 MG PO TABS: 150.0000 mg | ORAL_TABLET | Freq: Every day | ORAL | 0 refills | Status: DC

## 2020-03-17 MED ORDER — HYDROXYZINE HCL 25 MG PO TABS: 25.0000 mg | ORAL_TABLET | Freq: Three times a day (TID) | ORAL | 0 refills | Status: DC | PRN

## 2020-03-17 NOTE — Patient Instructions (Signed)
Neuropathic Pain Neuropathic pain is pain caused by damage to the nerves that are responsible for certain sensations in your body (sensory nerves). The pain can be caused by:  Damage to the sensory nerves that send signals to your spinal cord and brain (peripheral nervous system).  Damage to the sensory nerves in your brain or spinal cord (central nervous system). Neuropathic pain can make you more sensitive to pain. Even a minor sensation can feel very painful. This is usually a long-term condition that can be difficult to treat. The type of pain differs from person to person. It may:  Start suddenly (acute), or it may develop slowly and last for a long time (chronic).  Come and go as damaged nerves heal, or it may stay at the same level for years.  Cause emotional distress, loss of sleep, and a lower quality of life. What are the causes? The most common cause of this condition is diabetes. Many other diseases and conditions can also cause neuropathic pain. Causes of neuropathic pain can be classified as:  Toxic. This is caused by medicines and chemicals. The most common cause of toxic neuropathic pain is damage from cancer treatments (chemotherapy).  Metabolic. This can be caused by: ? Diabetes. This is the most common disease that damages the nerves. ? Lack of vitamin B from long-term alcohol abuse.  Traumatic. Any injury that cuts, crushes, or stretches a nerve can cause damage and pain. A common example is feeling pain after losing an arm or leg (phantom limb pain).  Compression-related. If a sensory nerve gets trapped or compressed for a long period of time, the blood supply to the nerve can be cut off.  Vascular. Many blood vessel diseases can cause neuropathic pain by decreasing blood supply and oxygen to nerves.  Autoimmune. This type of pain results from diseases in which the body's defense system (immune system) mistakenly attacks sensory nerves. Examples of autoimmune diseases  that can cause neuropathic pain include lupus and multiple sclerosis.  Infectious. Many types of viral infections can damage sensory nerves and cause pain. Shingles infection is a common cause of this type of pain.  Inherited. Neuropathic pain can be a symptom of many diseases that are passed down through families (genetic). What increases the risk? You are more likely to develop this condition if:  You have diabetes.  You smoke.  You drink too much alcohol.  You are taking certain medicines, including medicines that kill cancer cells (chemotherapy) or that treat immune system disorders. What are the signs or symptoms? The main symptom is pain. Neuropathic pain is often described as:  Burning.  Shock-like.  Stinging.  Hot or cold.  Itching. How is this diagnosed? No single test can diagnose neuropathic pain. It is diagnosed based on:  Physical exam and your symptoms. Your health care provider will ask you about your pain. You may be asked to use a pain scale to describe how bad your pain is.  Tests. These may be done to see if you have a high sensitivity to pain and to help find the cause and location of any sensory nerve damage. They include: ? Nerve conduction studies to test how well nerve signals travel through your sensory nerves (electrodiagnostic testing). ? Stimulating your sensory nerves through electrodes on your skin and measuring the response in your spinal cord and brain (somatosensory evoked potential).  Imaging studies, such as: ? X-rays. ? CT scan. ? MRI. How is this treated? Treatment for neuropathic pain may change   over time. You may need to try different treatment options or a combination of treatments. Some options include:  Treating the underlying cause of the neuropathy, such as diabetes, kidney disease, or vitamin deficiencies.  Stopping medicines that can cause neuropathy, such as chemotherapy.  Medicine to relieve pain. Medicines may  include: ? Prescription or over-the-counter pain medicine. ? Anti-seizure medicine. ? Antidepressant medicines. ? Pain-relieving patches that are applied to painful areas of skin. ? A medicine to numb the area (local anesthetic), which can be injected as a nerve block.  Transcutaneous nerve stimulation. This uses electrical currents to block painful nerve signals. The treatment is painless.  Alternative treatments, such as: ? Acupuncture. ? Meditation. ? Massage. ? Physical therapy. ? Pain management programs. ? Counseling. Follow these instructions at home: Medicines  Take over-the-counter and prescription medicines only as told by your health care provider.  Do not drive or use heavy machinery while taking prescription pain medicine.  If you are taking prescription pain medicine, take actions to prevent or treat constipation. Your health care provider may recommend that you: ? Drink enough fluid to keep your urine pale yellow. ? Eat foods that are high in fiber, such as fresh fruits and vegetables, whole grains, and beans. ? Limit foods that are high in fat and processed sugars, such as fried or sweet foods. ? Take an over-the-counter or prescription medicine for constipation.   Lifestyle  Have a good support system at home.  Consider joining a chronic pain support group.  Do not use any products that contain nicotine or tobacco, such as cigarettes and e-cigarettes. If you need help quitting, ask your health care provider.  Do not drink alcohol.   General instructions  Learn as much as you can about your condition.  Work closely with all your health care providers to find the treatment plan that works best for you.  Ask your health care provider what activities are safe for you.  Keep all follow-up visits as told by your health care provider. This is important. Contact a health care provider if:  Your pain treatments are not working.  You are having side effects  from your medicines.  You are struggling with tiredness (fatigue), mood changes, depression, or anxiety. Summary  Neuropathic pain is pain caused by damage to the nerves that are responsible for certain sensations in your body (sensory nerves).  Neuropathic pain may come and go as damaged nerves heal, or it may stay at the same level for years.  Neuropathic pain is usually a long-term condition that can be difficult to treat. Consider joining a chronic pain support group. This information is not intended to replace advice given to you by your health care provider. Make sure you discuss any questions you have with your health care provider. Document Revised: 04/18/2018 Document Reviewed: 01/12/2017 Elsevier Patient Education  2021 [employer].

## 2020-03-17 NOTE — Progress Notes (Signed)
Having neuropathy pains. Pt has paperwork.

## 2020-03-17 NOTE — Telephone Encounter (Signed)
notifiedChristie, pharmacy technician at [facility] the pt will not be picking up the medication from this pharmacy; she verbalized understanding.

## 2020-03-17 NOTE — Telephone Encounter (Signed)
Dylan [name] with [name] has reached out on behalf of patient regarding medication is calling in to request to have Rx for gabapentin (NEURONTIN) 300 MG capsule sent in to the correct pharmacy. Pt is currently sent to a [facility]. Pt is unable to pick up. [facility] will deliver to pt at the Day center.   Pharmacy: [place]

## 2020-03-17 NOTE — Telephone Encounter (Addendum)
Per previous encounter, "Phyllis with [facility] has reached out on behalf of patient regarding medication is calling in to request to have Rx for gabapentin (NEURONTIN) 300 MG capsule sent in to the correct pharmacy. Pt is currently sent to a [facility]. Pt is unable to pick up. [facility] will deliver to pt at the Day center. Pharmacy: [facility]"; spoke with Trenda Moots, LPN; she says the phone number is the pharmacy is  [phone]; the address is [address], [place] [address] Requested Prescriptions  Pending Prescriptions Disp Refills  . gabapentin (NEURONTIN) 300 MG capsule 60 capsule 3    Sig: Take 1 capsule (300 mg total) by mouth at bedtime.     Neurology: Anticonvulsants - gabapentin Passed - 03/17/2020  1:10 PM      Passed - Valid encounter within last 12 months    Recent Outpatient Visits          3 months ago Pure hypercholesterolemia   [place], Enobong, MD   7 months ago Bilateral impacted cerumen   [place], Enobong, MD   1 year ago Neuropathy   [place], Enobong, MD   1 year ago Screening for colon cancer   [place], Enobong, MD   1 year ago Other male erectile dysfunction   [place], Charlane Ferretti, MD      Future Appointments            Today Charlott Rakes, MD [place]

## 2020-03-17 NOTE — Progress Notes (Signed)
[place] MD/PA/NP OP Progress Note  Virtual Visit via Telephone Note  I connected with Dylan [name] on 03/18/20 at  3:30 PM EST by telephone and verified that I am speaking with the correct person using two identifiers.  Location: Patient: [facility] Provider: Clinic   I discussed the limitations, risks, security and privacy concerns of performing an evaluation and management service by telephone and the availability of in person appointments. I also discussed with the patient that there may be a patient responsible charge related to this service. The patient expressed understanding and agreed to proceed.  Follow Up Instructions:   I discussed the assessment and treatment plan with the patient. The patient was provided an opportunity to ask questions and all were answered. The patient agreed with the plan and demonstrated an understanding of the instructions.   The patient was advised to call back or seek an in-person evaluation if the symptoms worsen or if the condition fails to improve as anticipated.  I provided 21 minutes of non-face-to-face time during this encounter.  [name], PA   03/18/2020 4:41 AM Dylan [name]  MRN:  [id number]  Chief Complaint: Follow-up and medication management  HPI:   Dylan [name]. [name] is a 55 year old male with a past psychiatric history significant for schizoaffective disorder (bipolar type), insomnia, and generalized anxiety disorder who presents to [facility] via virtual telephone visit for follow-up and medication management. Patient is currently being managed on the following medications:  Bupropion 150 mg 24-hour tablet Quetiapine 100 mg at bedtime Trazodone 100 mg at bedtime  Patient was recently seen at [facility] on 03/07/2020 presenting to [facility] Emergency Department due to suicidal ideation with a plan to hang himself.  Patient was discharged on 03/10/2020 and was placed on  bupropion 150 mg 24-hour tablet.  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  Patient is currently at [facility] receiving treatment for his substance abuse/relapse. Patient states that he was admitted to the [facility] program on 03/10/2020.  Patient reports that he is doing well and has no issues with his current regimen of medications.  Patient reports that he experiences neuropathy in his toes with the pain occurring mostly at night.  Patient reports his neuropathic pain a 9 out of 10.  Patient states that he recently got in contact with his primary care provider and was informed about Cymbalta having efficacy in managing neuropathic pain.  Patient is interested in being placed on Cymbalta sometime in the near future for his depression and neuropathic pain.  Patient is interested in being placed back on hydroxyzine for the management of anxiety.  Patient is pleasant, calm, cooperative, and fully engaged in conversation during the encounter.  Patient reports that he is in a good mood and does not feel like a danger to himself.  Patient denies suicidal and homicidal ideations.  Patient further denies auditory or visual hallucinations.  Patient endorses fair sleep and receives on average 5 hours of sleep due to disturbance from his neuropathic pain he experiences in his toes.  Patient endorses good appetite and eats on average 3 meals per day.  Patient denies alcohol consumption, tobacco use, and illicit drug use.   Visit Diagnosis:    ICD-10-CM   1. Generalized anxiety disorder  F41.1 hydrOXYzine (ATARAX/VISTARIL) 25 MG tablet  2. Schizoaffective disorder, bipolar type ([place])  F25.0   3.  Insomnia due to other mental disorder  F51.05 traZODone (DESYREL) 150 MG tablet   F99     Past Psychiatric History:  Schizoaffective disorder, bipolar type Anxiety Insomnia  Past Medical History:  Past Medical History:   Diagnosis Date  . Anxiety   . Arthritis   . Bipolar 1 disorder ([place])   . Colon polyps   . Depression   . GERD (gastroesophageal reflux disease)   . Hepatitis B   . Human immunodeficiency virus (HIV) ([place])   . Hyperlipidemia   . Hypertension   . Neuromuscular disorder (HCC)    neuropathy  . Neuropathy   . Pre-diabetes   . Schizophrenia [facility])     Past Surgical History:  Procedure Laterality Date  . FOOT ARTHRODESIS Right 09/17/2019   Procedure: FUSION RIGHT LISFRANC JOINT;  Surgeon: Newt Minion, MD;  Location: [facility];  Service: Orthopedics;  Laterality: Right;  . FOOT ARTHRODESIS Right 09/2019  . HEMORROIDECTOMY    . IR FLUORO GUIDE CV LINE RIGHT  02/22/2017  . IR US GUIDE VASC ACCESS RIGHT  02/22/2017  . TUMOR REMOVAL     From Chest    Family Psychiatric History:  Mother - patient is unsure but he states she does talk to herself a lot Uncle (maternal side) - Talks to himself and prefers to live on the street  Family History:  Family History  Problem Relation Age of Onset  . CAD Mother   . Diabetes Father   . Colon cancer Maternal Aunt 88  . Diabetes Maternal Aunt   . Heart disease Maternal Uncle   . Esophageal cancer Neg Hx   . Rectal cancer Neg Hx   . Stomach cancer Neg Hx     Social History:  Social History   Socioeconomic History  . Marital status: Married    Spouse name: Not on file  . Number of children: 1  . Years of education: Not on file  . Highest education level: Not on file  Occupational History  . Not on file  Tobacco Use  . Smoking status: Current Every Day Smoker    Packs/day: 0.50    Years: 15.00    Pack years: 7.50    Types: Cigarettes  . Smokeless tobacco: Never Used  . Tobacco comment: has patches when he is ready to quit  Vaping Use  . Vaping Use: Some days  . Substances: Nicotine  Substance and Sexual Activity  . Alcohol use: Yes    Comment: occassional use on weekends  . Drug use: Yes    Types: Cocaine, Marijuana    Comment:  last use was Friday 03/05/2020  . Sexual activity: Yes    Partners: Female    Birth control/protection: Condom    Comment: condoms given  Other Topics Concern  . Not on file  Social History Narrative  . Not on file   Social Determinants of Health   Financial Resource Strain: Not on file  Food Insecurity: Not on file  Transportation Needs: Not on file  Physical Activity: Not on file  Stress: Not on file  Social Connections: Not on file    Allergies:  Allergies  Allergen Reactions  . Penicillins Other (See Comments)    Childhood allergy  . Latex Rash  . Tape Rash    Metabolic Disorder Labs: Lab Results  Component Value Date   HGBA1C 6.0 (H) 03/08/2020   MPG 125.5 03/08/2020   MPG 122.63 11/09/2019   Lab Results  Component Value Date  PROLACTIN 12.1 03/08/2020   PROLACTIN 2.6 (L) 11/09/2019   Lab Results  Component Value Date   CHOL 254 (H) 03/08/2020   TRIG 204 (H) 03/08/2020   HDL 41 03/08/2020   CHOLHDL 6.2 03/08/2020   VLDL 41 (H) 03/08/2020   LDLCALC 172 (H) 03/08/2020   LDLCALC 146 (H) 11/09/2019   Lab Results  Component Value Date   TSH 1.727 03/08/2020   TSH 0.931 11/09/2019    Therapeutic Level Labs: No results found for: LITHIUM No results found for: VALPROATE No components found for:  CBMZ  Current Medications: Current Outpatient Medications  Medication Sig Dispense Refill  . hydrOXYzine (ATARAX/VISTARIL) 25 MG tablet Take 1 tablet (25 mg total) by mouth 3 (three) times daily as needed for anxiety. 75 tablet 0  . bictegravir-emtricitabine-tenofovir AF (BIKTARVY) 50-200-25 MG TABS tablet Take 1 tablet by mouth daily. 30 tablet 0  . buPROPion (WELLBUTRIN XL) 150 MG 24 hr tablet Take 1 tablet (150 mg total) by mouth daily. 30 tablet 0  . gabapentin (NEURONTIN) 300 MG capsule Take orally 2 caps ('600mg'$ ) in the morning and 3 caps ('900mg'$ ) in the evening 150 capsule 0  . pantoprazole (PROTONIX) 40 MG tablet Take 1 tablet (40 mg total) by mouth  daily. 30 tablet 0  . QUEtiapine (SEROQUEL) 100 MG tablet Take 1 tablet (100 mg total) by mouth at bedtime. 30 tablet 0  . rosuvastatin (CRESTOR) 20 MG tablet Take 1 tablet (20 mg total) by mouth daily. 30 tablet 0  . traZODone (DESYREL) 150 MG tablet Take 1 tablet (150 mg total) by mouth at bedtime. 30 tablet 0   No current facility-administered medications for this visit.     Musculoskeletal: Strength & Muscle Tone: Unable to assess due to telemedicine visit [place]: Unable to assess due to telemedicine visit Patient leans: Unable to assess due to telemedicine visit  Psychiatric Specialty Exam: Review of Systems  Psychiatric/Behavioral: Positive for sleep disturbance. Negative for agitation, decreased concentration, dysphoric mood, hallucinations, self-injury and suicidal ideas. The patient is nervous/anxious. The patient is not hyperactive.     There were no vitals taken for this visit.There is no height or weight on file to calculate BMI.  General Appearance: Unable to assess due to telemedicine visit  Eye Contact:  Unable to assess due to telemedicine visit  Speech:  Clear and Coherent and Normal Rate  Volume:  Normal  Mood:  Anxious and Euthymic  Affect:  Appropriate and Congruent  Thought Process:  Coherent, Goal Directed and Descriptions of Associations: Intact  Orientation:  Full (Time, Place, and Person)  Thought Content: WDL and Logical   Suicidal Thoughts:  No  Homicidal Thoughts:  No  Memory:  Immediate;   Good Recent;   Good Remote;   Good  Judgement:  Fair  Insight:  Fair  Psychomotor Activity:  Normal  Concentration:  Concentration: Good and Attention Span: Good  Recall:  Good  Fund of Knowledge: Fair  Language: Good  Akathisia:  NA  Handed:  Right  AIMS (if indicated): not done  Assets:  Communication Skills Desire for Improvement Housing Intimacy Social Support Vocational/Educational  ADL's:  Intact  Cognition: WNL  Sleep:  Fair    Screenings: AIMS   [place] Admission (Discharged) from 03/07/2020 in [place] [facility]  AIMS Total Score 0    AUDIT   Flowsheet Row Admission (Discharged) from 03/07/2020 in [place] [facility] Admission (Discharged) from 12/12/2011 in [place]  INPATIENT ADULT 400B  Alcohol Use Disorder Identification Test Final Score (AUDIT) 13 8    GAD-7   Flowsheet Row Office Visit from 03/17/2020 in [place] Office Visit from 07/22/2019 in [facility] Office Visit from 09/03/2018 in [place]  Total GAD-7 Score '16 19 19    '$ PHQ2-9   [place] Office Visit from 03/17/2020 in [place] ED from 11/09/2019 in [facility] Office Visit from 07/22/2019 in [place] Office Visit from 09/23/2018 in [facility] Office Visit from 09/03/2018 in [place]  PHQ-2 Total Score '1 6 4 1 4  '$ PHQ-9 Total Score '9 19 15 '$ - 15    Flowsheet Row Video Visit from 03/17/2020 in [facility] Most recent reading at 03/17/2020  3:48 PM Admission (Discharged) from 03/07/2020 in [place] [facility] Most recent reading at 03/08/2020 12:07 AM ED from 03/07/2020 in [place] DEPT Most recent reading at 03/07/2020 11:02 AM  C-SSRS RISK CATEGORY Moderate Risk High Risk High Risk       Assessment and Plan:   Alvin Saracco. Fenerty is a 55 year old male with a past psychiatric history significant for schizoaffective disorder (bipolar type), insomnia, and generalized anxiety disorder who presents to [facility] via virtual telephone visit for follow-up and medication management.  Patient is currently at  [facility] receiving treatment for recent relapse with marijuana, cocaine, and alcohol use.  Prior to being admitted to [facility], patient was admitted to [facility] for suicidal ideations and was discharged on his current regimen of medications as well as bupropion 150 mg 24-hour tablet.  Patient reports no issues or concerns with his current regimen of medication and is interested in being placed on hydroxyzine for the management of his anxiety.  Patient also reports disturbances in his sleep due to his neuropathic pain.  Patient was recommended increasing his trazodone dose from 100 mg to 150 mg for the management of his sleep.  Patient was agreeable to recommendations.  Patient's medications will be e-prescribed to pharmacy of choice.  1. Generalized anxiety disorder  - hydrOXYzine (ATARAX/VISTARIL) 25 MG tablet; Take 1 tablet (25 mg total) by mouth 3 (three) times daily as needed for anxiety.  Dispense: 75 tablet; Refill: 0  2. Schizoaffective disorder, bipolar type ([place]) Patient to continue taking quetiapine 100 mg at bedtime for the management of his schizoaffective disorder  3. Insomnia due to other mental disorder  - traZODone (DESYREL) 150 MG tablet; Take 1 tablet (150 mg total) by mouth at bedtime.  Dispense: 30 tablet; Refill: 0  Patient to follow up in 6 weeks  [name], PA 03/18/2020, 4:41 AM

## 2020-03-17 NOTE — Progress Notes (Signed)
Subjective:  Patient ID: Dylan [name], male    DOB: 02-10-1965  Age: 55 y.o. MRN: [id number]  CC: Hospitalization Follow-up   HPI Dylan [name] is a8 year old male with a history of HIV(on antiretroviral therapy), hypertension, schizoaffectivedisorder, substance abuse, chronic left knee pain, right second metatarsal fracture status post right foot fusion in 09/2019. Hospitalized from 03/07/2020 through 03/10/2020 for suicidal ideations.  Urine drug screen was positive for cocaine and THC, alcohol level was normal.  He was commenced on psychotropic medications.  During his hospitalization his cholesterol came back elevated and he was commenced on Crestor. He was scheduled to follow-up at [facility] on 03/16/2020.  Currently at [facility] for drug rehab until 04/06/20.  He informs me today that his appointment at the [facility] was supposed to be today. Last recreational drug use was 2 weeks ago per patient.  His Neuropathy in his feet is worse at night and is more of pain now.  I had prescribed him gabapentin 600 mg twice daily which he states he has been compliant with. Past Medical History:  Diagnosis Date  . Anxiety   . Arthritis   . Bipolar 1 disorder ([place])   . Colon polyps   . Depression   . GERD (gastroesophageal reflux disease)   . Hepatitis B   . Human immunodeficiency virus (HIV) ([place])   . Hyperlipidemia   . Hypertension   . Neuromuscular disorder (HCC)    neuropathy  . Neuropathy   . Pre-diabetes   . Schizophrenia [facility])     Past Surgical History:  Procedure Laterality Date  . FOOT ARTHRODESIS Right 09/17/2019   Procedure: FUSION RIGHT LISFRANC JOINT;  Surgeon: Newt Minion, MD;  Location: [facility];  Service: Orthopedics;  Laterality: Right;  . FOOT ARTHRODESIS Right 09/2019  . HEMORROIDECTOMY    . IR FLUORO GUIDE CV LINE RIGHT  02/22/2017  . IR US GUIDE VASC ACCESS RIGHT  02/22/2017  . TUMOR REMOVAL     From Chest     Family History  Problem Relation Age of Onset  . CAD Mother   . Diabetes Father   . Colon cancer Maternal Aunt 14  . Diabetes Maternal Aunt   . Heart disease Maternal Uncle   . Esophageal cancer Neg Hx   . Rectal cancer Neg Hx   . Stomach cancer Neg Hx     Allergies  Allergen Reactions  . Penicillins Other (See Comments)    Childhood allergy  . Latex Rash  . Tape Rash    Outpatient Medications Prior to Visit  Medication Sig Dispense Refill  . bictegravir-emtricitabine-tenofovir AF (BIKTARVY) 50-200-25 MG TABS tablet Take 1 tablet by mouth daily. 30 tablet 0  . buPROPion (WELLBUTRIN XL) 150 MG 24 hr tablet Take 1 tablet (150 mg total) by mouth daily. 30 tablet 0  . pantoprazole (PROTONIX) 40 MG tablet Take 1 tablet (40 mg total) by mouth daily. 30 tablet 0  . QUEtiapine (SEROQUEL) 100 MG tablet Take 1 tablet (100 mg total) by mouth at bedtime. 30 tablet 0  . rosuvastatin (CRESTOR) 20 MG tablet Take 1 tablet (20 mg total) by mouth daily. 30 tablet 0  . traZODone (DESYREL) 100 MG tablet Take 1 tablet (100 mg total) by mouth at bedtime. 30 tablet 0  . gabapentin (NEURONTIN) 300 MG capsule Take 1 capsule (300 mg total) by mouth at bedtime. (Patient not taking: Reported on 03/17/2020) 60 capsule 3   No facility-administered medications prior  to visit.     ROS Review of Systems  Constitutional: Negative for activity change and appetite change.  HENT: Negative for sinus pressure and sore throat.   Eyes: Negative for visual disturbance.  Respiratory: Negative for cough, chest tightness and shortness of breath.   Cardiovascular: Negative for chest pain and leg swelling.  Gastrointestinal: Negative for abdominal distention, abdominal pain, constipation and diarrhea.  Endocrine: Negative.   Genitourinary: Negative for dysuria.  Musculoskeletal: Negative for joint swelling and myalgias.  Skin: Negative for rash.  Allergic/Immunologic: Negative.   Neurological: Positive for  numbness. Negative for weakness and light-headedness.  Psychiatric/Behavioral: Negative for dysphoric mood and suicidal ideas.    Objective:  BP 115/69   Pulse 76   Ht '6\' 2"'$  (1.88 m)   Wt 244 lb 6.4 oz (110.9 kg)   SpO2 98%   BMI 31.38 kg/m   BP/Weight 03/17/2020 03/10/2020 [id number]  Systolic BP [id number] -  Diastolic BP 69 70 -  Wt. (Lbs) 244.4 - 231  BMI 31.38 - -      Physical Exam Constitutional:      Appearance: He is well-developed.  Neck:     Vascular: No JVD.  Cardiovascular:     Rate and Rhythm: Normal rate.     Heart sounds: Normal heart sounds. No murmur heard.   Pulmonary:     Effort: Pulmonary effort is normal.     Breath sounds: Normal breath sounds. No wheezing or rales.  Chest:     Chest wall: No tenderness.  Abdominal:     General: Bowel sounds are normal. There is no distension.     Palpations: Abdomen is soft. There is no mass.     Tenderness: There is no abdominal tenderness.  Musculoskeletal:        General: Normal range of motion.     Right lower leg: No edema.     Left lower leg: No edema.  Neurological:     Mental Status: He is alert and oriented to person, place, and time.  Psychiatric:        Mood and Affect: Mood normal.     CMP Latest Ref Rng & Units 03/07/2020 11/09/2019 09/17/2019  Glucose 70 - 99 mg/dL 104(H) 101(H) 97  BUN 6 - 20 mg/dL 14 21(H) 11  Creatinine 0.61 - 1.24 mg/dL 1.10 1.09 1.07  Sodium 135 - 145 mmol/L 137 136 138  Potassium 3.5 - 5.1 mmol/L 3.7 3.9 4.1  Chloride 98 - 111 mmol/L 101 103 105  CO2 22 - 32 mmol/L '26 23 23  '$ Calcium 8.9 - 10.3 mg/dL 9.7 9.9 9.4  Total Protein 6.5 - 8.1 g/dL 8.2(H) 7.8 7.5  Total Bilirubin 0.3 - 1.2 mg/dL 0.8 0.8 0.7  Alkaline Phos 38 - 126 U/L 91 91 77  AST 15 - 41 U/L 34 91(H) 40  ALT 0 - 44 U/L 36 53(H) 49(H)    Lipid Panel     Component Value Date/Time   CHOL 254 (H) 03/08/2020 1805   CHOL 158 02/13/2019 1128   TRIG 204 (H) 03/08/2020 1805   HDL 41 03/08/2020 1805   HDL 41  02/13/2019 1128   CHOLHDL 6.2 03/08/2020 1805   VLDL 41 (H) 03/08/2020 1805   LDLCALC 172 (H) 03/08/2020 1805   LDLCALC 97 02/13/2019 1128   LDLCALC 149 (H) 06/06/2018 1406    CBC    Component Value Date/Time   WBC 6.5 03/07/2020 1247   RBC 4.94 03/07/2020 1247   HGB 15.2  03/07/2020 1247   HCT 46.3 03/07/2020 1247   PLT 212 03/07/2020 1247   MCV 93.7 03/07/2020 1247   MCH 30.8 03/07/2020 1247   MCHC 32.8 03/07/2020 1247   RDW 12.8 03/07/2020 1247   LYMPHSABS 2.3 11/09/2019 1000   MONOABS 1.1 (H) 11/09/2019 1000   EOSABS 0.1 11/09/2019 1000   BASOSABS 0.0 11/09/2019 1000    Lab Results  Component Value Date   HGBA1C 6.0 (H) 03/08/2020    Assessment & Plan:  1. Schizoaffective disorder, bipolar type ([place]) Stable Currently on anti-psychotics Follow-up with outpatient psych  2. Neuropathy Uncontrolled Likely medication induced from his antiretroviral therapy Increased evening dose of gabapentin He may benefit from addition of Cymbalta and has been advised to discuss this with his psychiatrist - gabapentin (NEURONTIN) 300 MG capsule; Take orally 2 caps ('600mg'$ ) in the morning and 3 caps ('900mg'$ ) in the evening  Dispense: 150 capsule; Refill: 0  3. Mixed hyperlipidemia Uncontrolled Currently on Crestor Low-cholesterol diet  4. Substance abuse ([place]) Currently in inpatient rehab He desires to continue with outpatient rehab once discharged We will discuss this with his psychiatrist.      Meds ordered this encounter  Medications  . gabapentin (NEURONTIN) 300 MG capsule    Sig: Take orally 2 caps ('600mg'$ ) in the morning and 3 caps ('900mg'$ ) in the evening    Dispense:  150 capsule    Refill:  0    Increase dose    Follow-up: Return in about 6 months (around 09/17/2020) for Chronic disease management.       Charlott Rakes, MD, FAAFP. [facility] and [place], [place]   03/17/2020, 2:46 PM

## 2020-03-18 ENCOUNTER — Encounter (HOSPITAL_COMMUNITY): Payer: Self-pay | Admitting: Physician Assistant

## 2020-03-24 ENCOUNTER — Other Ambulatory Visit (HOSPITAL_COMMUNITY)
Admission: RE | Admit: 2020-03-24 | Discharge: 2020-03-24 | Disposition: A | Discharge status: Home / Self Care | Payer: Medicaid Other | Source: Ambulatory Visit | Attending: Internal Medicine | Admitting: Internal Medicine

## 2020-03-24 ENCOUNTER — Other Ambulatory Visit: Payer: Medicaid Other

## 2020-03-24 ENCOUNTER — Other Ambulatory Visit: Payer: Self-pay

## 2020-03-24 DIAGNOSIS — B20 Human immunodeficiency virus [HIV] disease: Secondary | ICD-10-CM

## 2020-03-24 DIAGNOSIS — Z113 Encounter for screening for infections with a predominantly sexual mode of transmission: Secondary | ICD-10-CM | POA: Insufficient documentation

## 2020-03-25 LAB — URINE CYTOLOGY ANCILLARY ONLY
Chlamydia: NEGATIVE
Comment: NEGATIVE
Comment: NORMAL
Neisseria Gonorrhea: NEGATIVE

## 2020-03-25 LAB — T-HELPER CELL (CD4) - (RCID CLINIC ONLY)
CD4 % Helper T Cell: 37 % (ref 33–65)
CD4 T Cell Abs: 797 /uL (ref 400–1790)

## 2020-03-28 LAB — COMPLETE METABOLIC PANEL WITH GFR
AG Ratio: 1.5 (calc) (ref 1.0–2.5)
ALT: 28 U/L (ref 9–46)
AST: 24 U/L (ref 10–35)
Albumin: 4.3 g/dL (ref 3.6–5.1)
Alkaline phosphatase (APISO): 100 U/L (ref 35–144)
BUN: 10 mg/dL (ref 7–25)
CO2: 29 mmol/L (ref 20–32)
Calcium: 9.8 mg/dL (ref 8.6–10.3)
Chloride: 102 mmol/L (ref 98–110)
Creat: 1.21 mg/dL (ref 0.70–1.33)
GFR, Est African American: 78 mL/min/{1.73_m2} (ref >=60)
GFR, Est Non African American: 67 mL/min/{1.73_m2} (ref >=60)
Globulin: 2.8 g/dL (calc) (ref 1.9–3.7)
Glucose, Bld: 86 mg/dL (ref 65–99)
Potassium: 4.6 mmol/L (ref 3.5–5.3)
Sodium: 139 mmol/L (ref 135–146)
Total Bilirubin: 0.3 mg/dL (ref 0.2–1.2)
Total Protein: 7.1 g/dL (ref 6.1–8.1)

## 2020-03-28 LAB — CBC WITH DIFFERENTIAL/PLATELET
Absolute Monocytes: 449 cells/uL (ref 200–950)
Basophils Absolute: 27 cells/uL (ref 0–200)
Basophils Relative: 0.4 %
Eosinophils Absolute: 710 cells/uL — ABNORMAL HIGH (ref 15–500)
Eosinophils Relative: 10.6 %
HCT: 43.1 % (ref 38.5–50.0)
Hemoglobin: 14.7 g/dL (ref 13.2–17.1)
Lymphs Abs: 2137 cells/uL (ref 850–3900)
MCH: 30.9 pg (ref 27.0–33.0)
MCHC: 34.1 g/dL (ref 32.0–36.0)
MCV: 90.7 fL (ref 80.0–100.0)
MPV: 10.9 fL (ref 7.5–12.5)
Monocytes Relative: 6.7 %
Neutro Abs: 3377 cells/uL (ref 1500–7800)
Neutrophils Relative %: 50.4 %
Platelets: 189 10*3/uL (ref 140–400)
RBC: 4.75 10*6/uL (ref 4.20–5.80)
RDW: 12.7 % (ref 11.0–15.0)
Total Lymphocyte: 31.9 %
WBC: 6.7 10*3/uL (ref 3.8–10.8)

## 2020-03-28 LAB — HIV-1 RNA QUANT-NO REFLEX-BLD
HIV 1 RNA Quant: 28 Copies/mL — ABNORMAL HIGH
HIV-1 RNA Quant, Log: 1.44 Log cps/mL — ABNORMAL HIGH

## 2020-04-05 ENCOUNTER — Telehealth (INDEPENDENT_AMBULATORY_CARE_PROVIDER_SITE_OTHER): Payer: Medicaid Other | Admitting: Internal Medicine

## 2020-04-05 ENCOUNTER — Encounter: Payer: Self-pay | Admitting: Internal Medicine

## 2020-04-05 ENCOUNTER — Other Ambulatory Visit: Payer: Self-pay

## 2020-04-05 DIAGNOSIS — G629 Polyneuropathy, unspecified: Secondary | ICD-10-CM | POA: Diagnosis not present

## 2020-04-05 DIAGNOSIS — E7800 Pure hypercholesterolemia, unspecified: Secondary | ICD-10-CM

## 2020-04-05 DIAGNOSIS — N182 Chronic kidney disease, stage 2 (mild): Secondary | ICD-10-CM | POA: Diagnosis not present

## 2020-04-05 DIAGNOSIS — B20 Human immunodeficiency virus [HIV] disease: Secondary | ICD-10-CM

## 2020-04-05 MED ORDER — ROSUVASTATIN CALCIUM 20 MG PO TABS: 20.0000 mg | ORAL_TABLET | Freq: Every day | ORAL | 11 refills | Status: DC

## 2020-04-05 MED ORDER — BIKTARVY 50-200-25 MG PO TABS: 1.0000 | ORAL_TABLET | Freq: Every day | ORAL | 11 refills | Status: DC

## 2020-04-05 MED ORDER — ROSUVASTATIN CALCIUM 20 MG PO TABS: 20.0000 mg | ORAL_TABLET | Freq: Every day | ORAL | 6 refills | Status: DC

## 2020-04-05 MED ORDER — BIKTARVY 50-200-25 MG PO TABS: 1.0000 | ORAL_TABLET | Freq: Every day | ORAL | 6 refills | Status: DC

## 2020-04-05 NOTE — Progress Notes (Signed)
Virtual Visit via Telephone Note  I connected with [name] on 04/05/20 at  9:30 AM EDT by telephone and verified that I am speaking with the correct person using two identifiers.  Location: Patient:  At house Provider: in clinic   I discussed the limitations, risks, security and privacy concerns of performing an evaluation and management service by telephone and the availability of in person appointments. I also discussed with the patient that there may be a patient responsible charge related to this service. The patient expressed understanding and agreed to proceed.   History of Present Illness:   finished 28 day rehab, recently. Doing better. Only has had etoh once since then but trying to keep sober. He states that in terms of hiv adherence, has done well. Has missed 2 doses  ROS:12 point ros is negative except for  Arthritis to foot and knee, may need surgery-sees dr duda Neuropathy +  IMMS: uptodate on covid - has had 3 doses.  Observations/Objective: fluent  Assessment and Plan: hiv disease= well controlled, but CD 4 count down during this visit. We will still continue with biktarvy. Repeat labs in 3 months. 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 Per his PCP who is managing. Recommend to check vitamin deficiency, given hx of etoh use  Arthritis = to follow up with dr duda Follow Up Instructions: 3 months, for testing  And visit   I discussed the assessment and treatment plan with the patient. The patient was provided an opportunity to ask questions and all were answered. The patient agreed with the plan and demonstrated an understanding of the instructions.   The patient was advised to call back or seek an in-person evaluation if the symptoms worsen or if the condition fails to improve as anticipated.  I provided 10 minutes of non-face-to-face time during this  encounter.   Carlyle Basques, MD

## 2020-04-12 ENCOUNTER — Ambulatory Visit (HOSPITAL_COMMUNITY): Payer: Medicaid Other | Admitting: Behavioral Health

## 2020-04-14 ENCOUNTER — Inpatient Hospital Stay: Payer: Self-pay | Admitting: Family Medicine

## 2020-04-27 ENCOUNTER — Encounter (HOSPITAL_COMMUNITY): Payer: Self-pay

## 2020-04-27 ENCOUNTER — Emergency Department (HOSPITAL_COMMUNITY): Payer: Medicaid Other

## 2020-04-27 ENCOUNTER — Emergency Department (HOSPITAL_COMMUNITY)
Admission: EM | Admit: 2020-04-27 | Discharge: 2020-04-27 | Disposition: A | Discharge status: Other Acute Inpatient Hospital | Payer: Medicaid Other | Attending: Emergency Medicine | Admitting: Emergency Medicine

## 2020-04-27 ENCOUNTER — Other Ambulatory Visit: Payer: Self-pay

## 2020-04-27 DIAGNOSIS — I120 Hypertensive chronic kidney disease with stage 5 chronic kidney disease or end stage renal disease: Secondary | ICD-10-CM | POA: Insufficient documentation

## 2020-04-27 DIAGNOSIS — N186 End stage renal disease: Secondary | ICD-10-CM | POA: Diagnosis not present

## 2020-04-27 DIAGNOSIS — Z21 Asymptomatic human immunodeficiency virus [HIV] infection status: Secondary | ICD-10-CM | POA: Insufficient documentation

## 2020-04-27 DIAGNOSIS — F1910 Other psychoactive substance abuse, uncomplicated: Secondary | ICD-10-CM | POA: Diagnosis not present

## 2020-04-27 DIAGNOSIS — F17290 Nicotine dependence, other tobacco product, uncomplicated: Secondary | ICD-10-CM | POA: Diagnosis not present

## 2020-04-27 DIAGNOSIS — Z20822 Contact with and (suspected) exposure to covid-19: Secondary | ICD-10-CM | POA: Diagnosis not present

## 2020-04-27 DIAGNOSIS — F209 Schizophrenia, unspecified: Secondary | ICD-10-CM | POA: Diagnosis not present

## 2020-04-27 DIAGNOSIS — R45851 Suicidal ideations: Secondary | ICD-10-CM | POA: Insufficient documentation

## 2020-04-27 DIAGNOSIS — Z992 Dependence on renal dialysis: Secondary | ICD-10-CM | POA: Diagnosis not present

## 2020-04-27 DIAGNOSIS — R443 Hallucinations, unspecified: Secondary | ICD-10-CM

## 2020-04-27 DIAGNOSIS — Z046 Encounter for general psychiatric examination, requested by authority: Secondary | ICD-10-CM | POA: Diagnosis present

## 2020-04-27 LAB — RAPID URINE DRUG SCREEN, HOSP PERFORMED
Amphetamines: NOT DETECTED
Barbiturates: NOT DETECTED
Benzodiazepines: NOT DETECTED
Cocaine: POSITIVE — AB
Opiates: NOT DETECTED
Tetrahydrocannabinol: NOT DETECTED

## 2020-04-27 LAB — CBC WITH DIFFERENTIAL/PLATELET
Abs Immature Granulocytes: 0.04 10*3/uL (ref 0.00–0.07)
Basophils Absolute: 0 10*3/uL (ref 0.0–0.1)
Basophils Relative: 0 %
Eosinophils Absolute: 0 10*3/uL (ref 0.0–0.5)
Eosinophils Relative: 0 %
HCT: 42.8 % (ref 39.0–52.0)
Hemoglobin: 14.6 g/dL (ref 13.0–17.0)
Immature Granulocytes: 0 %
Lymphocytes Relative: 15 %
Lymphs Abs: 2 10*3/uL (ref 0.7–4.0)
MCH: 31.1 pg (ref 26.0–34.0)
MCHC: 34.1 g/dL (ref 30.0–36.0)
MCV: 91.3 fL (ref 80.0–100.0)
Monocytes Absolute: 1 10*3/uL (ref 0.1–1.0)
Monocytes Relative: 8 %
Neutro Abs: 10.4 10*3/uL — ABNORMAL HIGH (ref 1.7–7.7)
Neutrophils Relative %: 77 %
Platelets: 195 10*3/uL (ref 150–400)
RBC: 4.69 MIL/uL (ref 4.22–5.81)
RDW: 13.1 % (ref 11.5–15.5)
WBC: 13.5 10*3/uL — ABNORMAL HIGH (ref 4.0–10.5)
nRBC: 0 % (ref 0.0–0.2)

## 2020-04-27 LAB — COMPREHENSIVE METABOLIC PANEL
ALT: 119 U/L — ABNORMAL HIGH (ref 0–44)
AST: 383 U/L — ABNORMAL HIGH (ref 15–41)
Albumin: 4.7 g/dL (ref 3.5–5.0)
Alkaline Phosphatase: 86 U/L (ref 38–126)
Anion gap: 9 (ref 5–15)
BUN: 23 mg/dL — ABNORMAL HIGH (ref 6–20)
CO2: 25 mmol/L (ref 22–32)
Calcium: 9.8 mg/dL (ref 8.9–10.3)
Chloride: 104 mmol/L (ref 98–111)
Creatinine, Ser: 1.25 mg/dL — ABNORMAL HIGH (ref 0.61–1.24)
GFR, Estimated: >60 mL/min (ref >60)
Glucose, Bld: 115 mg/dL — ABNORMAL HIGH (ref 70–99)
Potassium: 4.1 mmol/L (ref 3.5–5.1)
Sodium: 138 mmol/L (ref 135–145)
Total Bilirubin: 1 mg/dL (ref 0.3–1.2)
Total Protein: 8 g/dL (ref 6.5–8.1)

## 2020-04-27 LAB — RESP PANEL BY RT-PCR (FLU A&B, COVID) ARPGX2
Influenza A by PCR: NEGATIVE
Influenza B by PCR: NEGATIVE
SARS Coronavirus 2 by RT PCR: NEGATIVE

## 2020-04-27 LAB — ACETAMINOPHEN LEVEL: Acetaminophen (Tylenol), Serum: <10 ug/mL — ABNORMAL LOW (ref 10–30)

## 2020-04-27 LAB — SALICYLATE LEVEL: Salicylate Lvl: <7 mg/dL — ABNORMAL LOW (ref 7.0–30.0)

## 2020-04-27 LAB — ETHANOL: Alcohol, Ethyl (B): <10 mg/dL (ref <10)

## 2020-04-27 MED ORDER — HYDROXYZINE HCL 25 MG PO TABS
25.0000 mg | ORAL_TABLET | Freq: Three times a day (TID) | ORAL | Status: DC | PRN
  Administered 2020-04-27: 25 mg via ORAL
  Filled 2020-04-27: qty 1

## 2020-04-27 MED ORDER — BICTEGRAVIR-EMTRICITAB-TENOFOV 50-200-25 MG PO TABS
1.0000 | ORAL_TABLET | Freq: Every day | ORAL | Status: DC
  Administered 2020-04-27: 1 via ORAL
  Filled 2020-04-27: qty 1

## 2020-04-27 MED ORDER — ACETAMINOPHEN 325 MG PO TABS
650.0000 mg | ORAL_TABLET | Freq: Once | ORAL | Status: AC
  Administered 2020-04-27: 650 mg via ORAL
  Filled 2020-04-27: qty 2

## 2020-04-27 MED ORDER — QUETIAPINE FUMARATE 100 MG PO TABS
100.0000 mg | ORAL_TABLET | Freq: Every day | ORAL | Status: DC
  Administered 2020-04-27 (×2): 100 mg via ORAL
  Filled 2020-04-27 (×2): qty 1

## 2020-04-27 MED ORDER — GABAPENTIN 100 MG PO CAPS
100.0000 mg | ORAL_CAPSULE | Freq: Three times a day (TID) | ORAL | Status: DC
Start: 2020-04-27 — End: 2020-04-28
  Administered 2020-04-27 (×3): 100 mg via ORAL
  Filled 2020-04-27 (×3): qty 1

## 2020-04-27 MED ORDER — TRAZODONE HCL 100 MG PO TABS
150.0000 mg | ORAL_TABLET | Freq: Every day | ORAL | Status: DC
  Administered 2020-04-27: 150 mg via ORAL
  Filled 2020-04-27: qty 1

## 2020-04-27 NOTE — ED Provider Notes (Signed)
[facility] DEPT Provider Note   CSN: [id number] Arrival date & time: 04/27/20  0133     History Chief Complaint  Patient presents with  . Suicidal    Dylan [name] is a 55 y.o. male.  HPI  Patient is 55 year old male with past medical history of HIV on Biktarvy, hep B, bipolar 1, anxiety, schizoaffective disorder  Patient is presented today states that he has not been taking his medications.  He was brought in by [employer] found sitting on the edge of a bridge with hallucinations of entities pulling on his skin called "skinamites" who also speak to him.  He tells me that he felt his only way of getting rid of the skinamites was to jump off the bridge.  He denies any itching.  States he has not had any skin discomfort presently.  He states his right foot sometimes bothers him and somewhat achy presently but states no recent trauma or injuries.  He had surgery/plates and screws 4 years ago.  He endorses cocaine and marijuana use.  States that he last used cocaine 2 days ago.  Denies SI although he also states that he would jump off the bridge to avoid the skinamites.  Denies HI.     Past Medical History:  Diagnosis Date  . Anxiety   . Arthritis   . Bipolar 1 disorder ([place])   . Colon polyps   . Depression   . GERD (gastroesophageal reflux disease)   . Hepatitis B   . Human immunodeficiency virus (HIV) ([place])   . Hyperlipidemia   . Hypertension   . Neuromuscular disorder (HCC)    neuropathy  . Neuropathy   . Pre-diabetes   . Schizophrenia [facility])     Patient Active Problem List   Diagnosis Date Noted  . Fatty liver 03/09/2020  . Schizophrenia ([place]) 03/08/2020  . Generalized anxiety disorder 02/03/2020  . Insomnia due to other mental disorder 02/03/2020  . Lisfranc dislocation, right, initial encounter   . Prediabetes 07/22/2019  . Tobacco abuse 07/10/2018  . ESRD needing dialysis ([place]) 03/03/2018  . Schizoaffective disorder, bipolar type  ([place])   . Acute renal failure (ARF) ([place])   . Suicidal ideations   . Chronic 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 coma ([place])   . AKI (acute kidney injury) ([place]) 02/19/2017  . Rhabdomyolysis 02/19/2017  . Suicidal overdose ([place]) 02/19/2017  . Acute hepatitis 02/19/2017  . Elevated LFTs   . Schizoaffective disorder, depressive type ([place]) 09/05/2011  . Degenerative disc disease 08/21/2011  . HIV disease ([place]) 08/17/2011    Past Surgical History:  Procedure Laterality Date  . FOOT ARTHRODESIS Right 09/17/2019   Procedure: FUSION RIGHT LISFRANC JOINT;  Surgeon: Newt Minion, MD;  Location: [facility];  Service: Orthopedics;  Laterality: Right;  . FOOT ARTHRODESIS Right 09/2019  . HEMORROIDECTOMY    . IR FLUORO GUIDE CV LINE RIGHT  02/22/2017  . IR US GUIDE VASC ACCESS RIGHT  02/22/2017  . TUMOR REMOVAL     From Chest       Family History  Problem Relation Age of Onset  . CAD Mother   . Diabetes Father   . Colon cancer Maternal Aunt 65  . Diabetes Maternal Aunt   . Heart disease Maternal Uncle   . Esophageal cancer Neg Hx   . Rectal cancer Neg Hx   . Stomach cancer Neg Hx     Social History   Tobacco Use  . Smoking status:  Current Every Day Smoker    Packs/day: 0.50    Years: 15.00    Pack years: 7.50    Types: E-cigarettes  . Smokeless tobacco: Never Used  . Tobacco comment: has patches when he is ready to quit  Vaping Use  . Vaping Use: Some days  . Substances: Nicotine  Substance Use Topics  . Alcohol use: Yes    Comment: occassional use on weekends  . Drug use: Yes    Types: Cocaine, Marijuana    Comment: last use was Friday 03/05/2020    Home Medications Prior to Admission medications   Medication Sig Start Date End Date Taking? Authorizing Provider  bictegravir-emtricitabine-tenofovir AF (BIKTARVY) 50-200-25 MG TABS tablet Take 1 tablet by mouth daily. 04/05/20  Yes Carlyle Basques, MD  gabapentin (NEURONTIN) 300 MG capsule Take orally 2 caps  ('600mg'$ ) in the morning and 3 caps ('900mg'$ ) in the evening 03/17/20  Yes Charlott Rakes, MD  hydrOXYzine (ATARAX/VISTARIL) 25 MG tablet Take 1 tablet (25 mg total) by mouth 3 (three) times daily as needed for anxiety. 03/17/20  Yes Nwoko, Uchenna E, PA  QUEtiapine (SEROQUEL) 100 MG tablet Take 1 tablet (100 mg total) by mouth at bedtime. 03/09/20  Yes Ethelene Hal, NP  rosuvastatin (CRESTOR) 20 MG tablet Take 1 tablet (20 mg total) by mouth daily. 04/05/20  Yes Carlyle Basques, MD  traZODone (DESYREL) 150 MG tablet Take 1 tablet (150 mg total) by mouth at bedtime. 03/17/20  Yes Nwoko, Uchenna E, PA    Allergies    Penicillin g, Penicillins, Latex, and Tape  Review of Systems   Review of Systems  Constitutional: Negative for chills and fever.  HENT: Negative for congestion.   Respiratory: Negative for shortness of breath.   Cardiovascular: Negative for chest pain.  Gastrointestinal: Negative for abdominal pain.  Musculoskeletal: Negative for neck pain.  Psychiatric/Behavioral: Positive for hallucinations and suicidal ideas. The patient is nervous/anxious.     Physical Exam Updated Vital Signs BP (!) 139/96   Pulse 96   Temp 98.1 F (36.7 C)   Resp 18   SpO2 96%   Physical Exam Vitals and nursing note reviewed.  Constitutional:      General: He is not in acute distress.    Appearance: Normal appearance. He is not ill-appearing.  HENT:     Head: Normocephalic and atraumatic.  Eyes:     General: No scleral icterus.       Right eye: No discharge.        Left eye: No discharge.     Conjunctiva/sclera: Conjunctivae normal.  Cardiovascular:     Rate and Rhythm: Normal rate.  Pulmonary:     Effort: Pulmonary effort is normal.     Breath sounds: No stridor.  Musculoskeletal:     Comments: Remote fully healed surgical scar of right foot over the dorsum of the foot.  There is no significant swelling redness and there is no warmth to touch.  There is some mild tenderness to  palpation.  Neurological:     Mental Status: He is alert and oriented to person, place, and time. Mental status is at baseline.  Psychiatric:     Comments: Seems to be actively hallucinating.  Suicidal because of his hallucinations.     ED Results / Procedures / Treatments   Labs (all labs ordered are listed, but only abnormal results are displayed) Labs Reviewed  COMPREHENSIVE METABOLIC PANEL - Abnormal; Notable for the following components:      Result  Value   Glucose, Bld 115 (*)    BUN 23 (*)    Creatinine, Ser 1.25 (*)    AST 383 (*)    ALT 119 (*)    All other components within normal limits  RAPID URINE DRUG SCREEN, HOSP PERFORMED - Abnormal; Notable for the following components:   Cocaine POSITIVE (*)    All other components within normal limits  CBC WITH DIFFERENTIAL/PLATELET - Abnormal; Notable for the following components:   WBC 13.5 (*)    Neutro Abs 10.4 (*)    All other components within normal limits  SALICYLATE LEVEL - Abnormal; Notable for the following components:   Salicylate Lvl Q000111Q (*)    All other components within normal limits  ACETAMINOPHEN LEVEL - Abnormal; Notable for the following components:   Acetaminophen (Tylenol), Serum <10 (*)    All other components within normal limits  RESP PANEL BY RT-PCR (FLU A&B, COVID) [id number]  ETHANOL    EKG EKG Interpretation  Date/Time:  Tuesday April 27 2020 02:56:20 EDT Ventricular Rate:  76 PR Interval:  162 QRS Duration: 100 QT Interval:  422 QTC Calculation: 475 R Axis:   60 Text Interpretation: Sinus rhythm Atrial premature complexes Probable left ventricular hypertrophy Minimal ST elevation, lateral leads When compared with ECG of 11/09/2019, Premature atrial complexes are now present Confirmed by Delora Fuel ([id number]) on 04/27/2020 4:22:35 AM   Radiology DG Foot Complete Right  Result Date: 04/27/2020 CLINICAL DATA:  Midfoot pain EXAM: RIGHT FOOT COMPLETE - 3+ VIEW COMPARISON:  None. FINDINGS:  Postoperative changes at the 1st and 2nd tarsal metatarsal joints. Lucency around the screw suggests loosening. No acute fracture, subluxation or dislocation. Soft tissues are intact. Joint spaces maintained. IMPRESSION: Screws within the first and 2nd metatarsals and medial cuneiform with lucency around the screw suggesting loosening. No acute fracture. Electronically Signed   By: Rolm Baptise M.D.   On: 04/27/2020 02:48    Procedures Procedures   Medications Ordered in ED Medications  bictegravir-emtricitabine-tenofovir AF (BIKTARVY) 50-200-25 MG per tablet 1 tablet (has no administration in time range)  gabapentin (NEURONTIN) capsule 100 mg (has no administration in time range)  hydrOXYzine (ATARAX/VISTARIL) tablet 25 mg (has no administration in time range)  QUEtiapine (SEROQUEL) tablet 100 mg (100 mg Oral Given 04/27/20 0321)  traZODone (DESYREL) tablet 150 mg (has no administration in time range)    ED Course  I have reviewed the triage vital signs and the nursing notes.  Pertinent labs & imaging results that were available during my care of the patient were reviewed by me and considered in my medical decision making (see chart for details).  Clinical Course as of 04/27/20 0427  Tue Apr 27, 2020  0425 CBC with mild leukocytosis likely demarginalization from cocaine use physiologic stress.  CMP with creatinine with no significant changes from prior.  Mild transaminitis AST 2 times ALT likely this is alcohol related. [WF]  [id number] Urine drug screen positive for cocaine. [WF]  Q000111Q Ethanol, salicylate, Tylenol negative [WF]    Clinical Course User Index [WF] Tedd Sias, PA   MDM Rules/Calculators/A&P                          Patient is 55 year old male schizoaffective bipolar HIV. Brought in by [employer].  Hallucinating.  States that he would jump off bridge in order to escape his hallucinations.  Right foot x-ray with no acute fracture.   Screws and plates in place.  EKG no  significant changes from prior.  QTC less than 500.   IVC forms completed by myself and Dr. Dina Rich.  Patient placed in involuntary hold.  Diet order placed regular food.  No medications ordered.  Patient is under IVC.  Medically cleared at this time.  Awaiting TTS consultation.  Final Clinical Impression(s) / ED Diagnoses Final diagnoses:  Hallucination    Rx / DC Orders ED Discharge Orders    None       Tedd Sias, [place] 04/27/20 0427    Merryl Hacker, MD 04/27/20 [id number]

## 2020-04-27 NOTE — ED Notes (Signed)
Pt having auditory and visual hallucinations. Pt is speaking to himself in the hallway

## 2020-04-27 NOTE — ED Notes (Signed)
Call to [facility] for transport to [facility] they are not allowed to transport out of [place] per dispatch message left with [facility] for transport in the AM will notify [facility] psych unit of the circumstances. Mat RN  Charge RN [facility] notified that Mr Quintero may not be transported until 04/28/2020

## 2020-04-27 NOTE — ED Triage Notes (Signed)
Pt arrives via [employer] after being found sitting on the side of a bridge after the "skinamites were instructing him to stab himself 14 times".

## 2020-04-27 NOTE — ED Notes (Signed)
Pt wanded by security. Pt is now in bathroom changing into scrubs. Belongings placed in designated cabinet at nurses station
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
## 2020-04-27 NOTE — BH Assessment (Addendum)
[facility] Assessment Progress Note  Per Thomes Lolling, NP, this pt requires psychiatric hospitalization.  Pt presents under IVC initiated by EDP Thayer Jew, MD and IVC documents have been sent to [facility]  At 14:14 this Probation officer contacted Nash Mantis, RN, [facility] to submit pt for consideration for admission to [facility].  Decision is pending as of this writing.   Jalene Mullet, [place] Behavioral Health Coordinator [phone]   Addendum:  Irine Seal reports that [facility] will not be able to accommodate pt today, but will reconsider tomorrow.  At 14:26 I contacted Windell Hummingbird, LMFT for consideration at [facility].  Their decision is pending as of this writing.  Jalene Mullet, [place] Coordinator [phone]

## 2020-04-27 NOTE — BH Assessment (Signed)
Requested nursing Tilda Franco, RN) to place TTS machine in patient's room for initial assessment.

## 2020-04-27 NOTE — BH Assessment (Signed)
4/19: TTS assessment completed. Disposition pending provider recommendations.

## 2020-04-27 NOTE — Care Management (Signed)
Pt has been accepted at the [facility] and can arrive tonight (Tuesday) after 2000. Room: 307 Accepting: Dr. Domingo Cocking Attending: Dr. Domingo Cocking [phone]  Writer informed the RN through secure chat.

## 2020-04-27 NOTE — ED Notes (Signed)
[facility] [employer] [facility]  ARM

## 2020-04-27 NOTE — BH Assessment (Signed)
Pt has been accepted at the [facility] and can arrive tonight (Tuesday) after 2000.   Room: 307 Accepting: Dr. Domingo Cocking Attending: Dr. Domingo Cocking [phone]  This information was relayed to pt's providers at 1719.

## 2020-04-28 ENCOUNTER — Other Ambulatory Visit: Payer: Self-pay

## 2020-04-28 ENCOUNTER — Encounter: Payer: Self-pay | Admitting: Behavioral Health

## 2020-04-28 ENCOUNTER — Inpatient Hospital Stay
Admission: EM | Admit: 2020-04-28 | Discharge: 2020-04-29 | DRG: 885 | Disposition: A | Discharge status: Home / Self Care | Payer: Medicaid Other | Source: Ambulatory Visit | Attending: Behavioral Health | Admitting: Behavioral Health

## 2020-04-28 ENCOUNTER — Telehealth (HOSPITAL_COMMUNITY): Payer: Medicaid Other | Admitting: Physician Assistant

## 2020-04-28 DIAGNOSIS — F5105 Insomnia due to other mental disorder: Secondary | ICD-10-CM

## 2020-04-28 DIAGNOSIS — F3164 Bipolar disorder, current episode mixed, severe, with psychotic features: Secondary | ICD-10-CM | POA: Insufficient documentation

## 2020-04-28 DIAGNOSIS — Z91040 Latex allergy status: Secondary | ICD-10-CM

## 2020-04-28 DIAGNOSIS — R7989 Other specified abnormal findings of blood chemistry: Secondary | ICD-10-CM | POA: Diagnosis present

## 2020-04-28 DIAGNOSIS — Z79899 Other long term (current) drug therapy: Secondary | ICD-10-CM

## 2020-04-28 DIAGNOSIS — Z833 Family history of diabetes mellitus: Secondary | ICD-10-CM | POA: Diagnosis not present

## 2020-04-28 DIAGNOSIS — K219 Gastro-esophageal reflux disease without esophagitis: Secondary | ICD-10-CM | POA: Diagnosis present

## 2020-04-28 DIAGNOSIS — Z91048 Other nonmedicinal substance allergy status: Secondary | ICD-10-CM | POA: Diagnosis not present

## 2020-04-28 DIAGNOSIS — I10 Essential (primary) hypertension: Secondary | ICD-10-CM | POA: Diagnosis present

## 2020-04-28 DIAGNOSIS — Z880 Allergy status to penicillin: Secondary | ICD-10-CM | POA: Diagnosis not present

## 2020-04-28 DIAGNOSIS — F1410 Cocaine abuse, uncomplicated: Secondary | ICD-10-CM | POA: Diagnosis present

## 2020-04-28 DIAGNOSIS — Z9114 Patient's other noncompliance with medication regimen: Secondary | ICD-10-CM

## 2020-04-28 DIAGNOSIS — F419 Anxiety disorder, unspecified: Secondary | ICD-10-CM | POA: Diagnosis present

## 2020-04-28 DIAGNOSIS — Z8249 Family history of ischemic heart disease and other diseases of the circulatory system: Secondary | ICD-10-CM

## 2020-04-28 DIAGNOSIS — G629 Polyneuropathy, unspecified: Secondary | ICD-10-CM | POA: Diagnosis present

## 2020-04-28 DIAGNOSIS — Z21 Asymptomatic human immunodeficiency virus [HIV] infection status: Secondary | ICD-10-CM | POA: Diagnosis present

## 2020-04-28 DIAGNOSIS — M1990 Unspecified osteoarthritis, unspecified site: Secondary | ICD-10-CM | POA: Diagnosis present

## 2020-04-28 DIAGNOSIS — Z62810 Personal history of physical and sexual abuse in childhood: Secondary | ICD-10-CM | POA: Diagnosis present

## 2020-04-28 DIAGNOSIS — B181 Chronic viral hepatitis B without delta-agent: Secondary | ICD-10-CM | POA: Diagnosis present

## 2020-04-28 DIAGNOSIS — R45851 Suicidal ideations: Secondary | ICD-10-CM | POA: Diagnosis present

## 2020-04-28 DIAGNOSIS — E785 Hyperlipidemia, unspecified: Secondary | ICD-10-CM | POA: Diagnosis present

## 2020-04-28 DIAGNOSIS — Z9151 Personal history of suicidal behavior: Secondary | ICD-10-CM

## 2020-04-28 DIAGNOSIS — R7303 Prediabetes: Secondary | ICD-10-CM | POA: Diagnosis present

## 2020-04-28 DIAGNOSIS — B20 Human immunodeficiency virus [HIV] disease: Secondary | ICD-10-CM | POA: Diagnosis present

## 2020-04-28 DIAGNOSIS — F251 Schizoaffective disorder, depressive type: Secondary | ICD-10-CM | POA: Diagnosis present

## 2020-04-28 DIAGNOSIS — F411 Generalized anxiety disorder: Secondary | ICD-10-CM | POA: Diagnosis present

## 2020-04-28 LAB — LIPID PANEL
Cholesterol: 177 mg/dL (ref 0–200)
HDL: 55 mg/dL (ref >40)
LDL Cholesterol: 101 mg/dL — ABNORMAL HIGH (ref 0–99)
Total CHOL/HDL Ratio: 3.2 RATIO
Triglycerides: 105 mg/dL (ref <150)
VLDL: 21 mg/dL (ref 0–40)

## 2020-04-28 LAB — HEPATITIS PANEL, ACUTE
HCV Ab: NONREACTIVE
Hep A IgM: NONREACTIVE
Hep B C IgM: NONREACTIVE
Hepatitis B Surface Ag: REACTIVE — AB

## 2020-04-28 LAB — HEMOGLOBIN A1C
Hgb A1c MFr Bld: 6.2 % — ABNORMAL HIGH (ref 4.8–5.6)
Mean Plasma Glucose: 131.24 mg/dL

## 2020-04-28 MED ORDER — HYDROXYZINE HCL 25 MG PO TABS
25.0000 mg | ORAL_TABLET | Freq: Three times a day (TID) | ORAL | Status: DC | PRN
  Administered 2020-04-28: 25 mg via ORAL
  Filled 2020-04-28: qty 1

## 2020-04-28 MED ORDER — ROSUVASTATIN CALCIUM 20 MG PO TABS
20.0000 mg | ORAL_TABLET | Freq: Every day | ORAL | Status: DC
  Administered 2020-04-28 – 2020-04-29 (×2): 20 mg via ORAL
  Filled 2020-04-28 (×2): qty 1

## 2020-04-28 MED ORDER — GABAPENTIN 300 MG PO CAPS
600.0000 mg | ORAL_CAPSULE | Freq: Every morning | ORAL | Status: DC
  Administered 2020-04-28 – 2020-04-29 (×2): 600 mg via ORAL
  Filled 2020-04-28: qty 2

## 2020-04-28 MED ORDER — DULOXETINE HCL 30 MG PO CPEP: 30.0000 mg | ORAL_CAPSULE | Freq: Every day | ORAL | Status: DC

## 2020-04-28 MED ORDER — IBUPROFEN 600 MG PO TABS
600.0000 mg | ORAL_TABLET | Freq: Four times a day (QID) | ORAL | Status: DC | PRN
  Administered 2020-04-28: 600 mg via ORAL
  Filled 2020-04-28: qty 1

## 2020-04-28 MED ORDER — GABAPENTIN 300 MG PO CAPS
900.0000 mg | ORAL_CAPSULE | Freq: Every day | ORAL | Status: DC
  Administered 2020-04-28: 900 mg via ORAL
  Filled 2020-04-28 (×2): qty 3

## 2020-04-28 MED ORDER — QUETIAPINE FUMARATE 200 MG PO TABS
200.0000 mg | ORAL_TABLET | Freq: Every day | ORAL | Status: DC
  Administered 2020-04-28: 200 mg via ORAL
  Filled 2020-04-28: qty 1

## 2020-04-28 MED ORDER — NICOTINE POLACRILEX 2 MG MT GUM: 2.0000 mg | CHEWING_GUM | OROMUCOSAL | Status: DC | PRN

## 2020-04-28 MED ORDER — MAGNESIUM HYDROXIDE 400 MG/5ML PO SUSP: 30.0000 mL | Freq: Every day | ORAL | Status: DC | PRN

## 2020-04-28 MED ORDER — LIDOCAINE 5 % EX OINT
TOPICAL_OINTMENT | Freq: Two times a day (BID) | CUTANEOUS | Status: DC | PRN
  Filled 2020-04-28: qty 35.44

## 2020-04-28 MED ORDER — TRAZODONE HCL 50 MG PO TABS
150.0000 mg | ORAL_TABLET | Freq: Every day | ORAL | Status: DC
  Administered 2020-04-28: 150 mg via ORAL
  Filled 2020-04-28: qty 1

## 2020-04-28 MED ORDER — BICTEGRAVIR-EMTRICITAB-TENOFOV 50-200-25 MG PO TABS
1.0000 | ORAL_TABLET | Freq: Every day | ORAL | Status: DC
  Administered 2020-04-28 – 2020-04-29 (×2): 1 via ORAL
  Filled 2020-04-28 (×2): qty 1

## 2020-04-28 MED ORDER — ZIPRASIDONE MESYLATE 20 MG IM SOLR: 20.0000 mg | Freq: Four times a day (QID) | INTRAMUSCULAR | Status: DC | PRN

## 2020-04-28 MED ORDER — ALUM & MAG HYDROXIDE-SIMETH 200-200-20 MG/5ML PO SUSP: 30.0000 mL | ORAL | Status: DC | PRN

## 2020-04-28 MED ORDER — QUETIAPINE FUMARATE 100 MG PO TABS: 100.0000 mg | ORAL_TABLET | Freq: Every day | ORAL | Status: DC

## 2020-04-28 NOTE — BHH Suicide Risk Assessment (Signed)
[place] INPATIENT:  Family/Significant Other Suicide Prevention Education  Suicide Prevention Education:  Contact Attempts: Dylan [name] [phone]), has been identified by the patient as the family member/significant other with whom the patient will be residing, and identified as the person(s) who will aid the patient in the event of a mental health crisis.  With written consent from the patient, two attempts were made to provide suicide prevention education, prior to and/or following the patient's discharge.  We were unsuccessful in providing suicide prevention education.  A suicide education pamphlet was given to the patient to share with family/significant other.  Date and time of first attempt: 04/28/20 at 4:10PM Date and time of second attempt: Second attempt will need to be made.   HIPPA compliant voicemail left with contact information for follow up.   Dylan [name] 04/28/2020, 4:11 PM
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
## 2020-04-28 NOTE — Progress Notes (Signed)
This Probation officer spoke with Dylan [name] and was informed that patient's cell phone has still not been found and that the nurse, who documented patient's belongings, has not responded. This Probation officer next called Security at [employer] and they pointed me in the direction of [employer], a courier service, that the hospital utilizes. This Probation officer spoke with a team member of [employer] around 1610 and set up a 1000 pick up time for patient's knee brace and possible cell phone, if it turns up between now and the morning. This Soil scientist, as well as, patient of these findings. Patient stated "I don't care about the knee brace, that's a $1,000 phone. What would you do if I said I lost your car". This Probation officer explained to patient that I understand his frustration and concerns, however, I have been working on trying to locate his belongings. Patient also asked what would happen if he is discharged and he still does not have his phone. This Probation officer explained that he would be given contact information to [facility] ED. Patient also apologized for yelling at his nurse earlier.

## 2020-04-28 NOTE — Progress Notes (Signed)
Patient admitted from [facility], report received from [place], [place]. Patient calm and cooperative during assessment denying SI/HI/ Patient endorses AVH stating it comes and goes. Patient skin assessment completed with Britt Bolognese, RN. Pt has scar on top of left foot from a previous surgery when he fell off a ladder and broke his foot. Patient oriented to the unit and his room. Patient given support, education, and encouragement to be active in his treatment plan. Patient being monitored Q 15 minutes for safety per unit protocol. Pt remains safe on the unit.
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
## 2020-04-28 NOTE — BHH Suicide Risk Assessment (Signed)
[place] INPATIENT:  Family/Significant Other Suicide Prevention Education  Suicide Prevention Education:  Education Completed; Dylan [name] [phone]), has been identified by the patient as the family member/significant other with whom the patient will be residing, and identified as the person(s) who will aid the patient in the event of a mental health crisis (suicidal ideations/suicide attempt).  With written consent from the patient, the family member/significant other has been provided the following suicide prevention education, prior to the and/or following the discharge of the patient.  The suicide prevention education provided includes the following:  Suicide risk factors  Suicide prevention and interventions  National Suicide Hotline telephone number  [facility] assessment telephone number  [facility] Emergency Assistance [place] and/or Residential Mobile Crisis Unit telephone number  Request made of family/significant other to:  Remove weapons (e.g., guns, rifles, knives), all items previously/currently identified as safety concern.    Remove drugs/medications (over-the-counter, prescriptions, illicit drugs), all items previously/currently identified as a safety concern.  The family member/significant other verbalizes understanding of the suicide prevention education information provided.  The family member/significant other agrees to remove the items of safety concern listed above.  Friday stated that she did not know that pt was in the hospital. She inquired how he was doing, of which she was informed. She denied any fears for his safety or that of others, and him having any access to weapons.    [name] 04/28/2020, 4:34 PM

## 2020-04-28 NOTE — Plan of Care (Signed)
Has been in the milieu, cooperative and getting along with peers. Denying thoughts of self-harm. Denying hallucinations. Received HS medications and requested lidocaine creme, pain 8/10. Encouragements and support provided. Safety monitored as expected.

## 2020-04-28 NOTE — Progress Notes (Signed)
Recreation Therapy Notes  INPATIENT RECREATION TR PLAN  Patient Details Name: Dylan [name] MRN: [id number] DOB: 01-28-1965 Today's Date: 04/28/2020  Rec Therapy Plan Is patient appropriate for Therapeutic Recreation?: Yes Treatment times per week: at least 3 Estimated Length of Stay: 5-7 days TR Treatment/Interventions: Group participation (Comment)  Discharge Criteria Pt will be discharged from therapy if:: Discharged Treatment plan/goals/alternatives discussed and agreed upon by:: Patient/family  Discharge Summary     Asad Keeven 04/28/2020, 12:08 PM
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
## 2020-04-28 NOTE — Progress Notes (Signed)
Recreation Therapy Notes   Date: 04/28/2020  Time: 9:45 am   Location: Craft room     Behavioral response: N/A   Intervention Topic: Stress   Discussion/Intervention: Patient did not attend group.   Clinical Observations/Feedback:  Patient did not attend group.   Dung Prien LRT/CTRS        Marlyn Rabine 04/28/2020 11:34 AM

## 2020-04-28 NOTE — Progress Notes (Signed)
Pt appears sullen but is pleasant and cooperative. He complained of pain in his right foot and requested a walker. Pt is a low fall risk. He also complained of feeling "pinching" on his skin and hearing negative voices. He is seen interacting safely on the milieu. He denies SI and HI. He said he felt anxious this morning 10/10 but it was later relieved with vistaril. Will continue to monitor with 15 minute checks.

## 2020-04-28 NOTE — Progress Notes (Addendum)
This Probation officer was informed that patient stated that he was missing his cell phone and knee brace. Patient was admitted today from [facility] ED. This Probation officer called and spoke with Marzetta Board, Agricultural consultant at [facility] ED around 1245 pm. Marzetta Board stated to this Probation officer that she found his knee brace, but did not come across patient's cell phone. This Probation officer asked Marzetta Board what normally happens when a patient's belongings are left behind and how would patient get his things. Marzetta Board stated that she would speak to Security and give me a call back, however, this writer has not heard back from [place] as of yet.

## 2020-04-28 NOTE — Tx Team (Signed)
Interdisciplinary Treatment and Diagnostic Plan Update  04/28/2020 Time of Session: 9:00AM Dylan BRADSHER MRN: [id number]  Principal Diagnosis: <principal problem not specified>  Secondary Diagnoses: Active Problems:   Bipolar affective disorder, mixed, severe, with psychotic behavior ([place])   Current Medications:  Current Facility-Administered Medications  Medication Dose Route Frequency Provider Last Rate Last Admin  . alum & mag hydroxide-simeth (MAALOX/MYLANTA) 200-200-20 MG/5ML suspension 30 mL  30 mL Oral Q4H PRN Salley Scarlet, MD      . bictegravir-emtricitabine-tenofovir AF (BIKTARVY) 50-200-25 MG per tablet 1 tablet  1 tablet Oral Daily Salley Scarlet, MD   1 tablet at 04/28/20 0804  . gabapentin (NEURONTIN) capsule 600 mg  600 mg Oral q AM Salley Scarlet, MD   600 mg at 04/28/20 0804  . gabapentin (NEURONTIN) capsule 900 mg  900 mg Oral QHS Salley Scarlet, MD      . hydrOXYzine (ATARAX/VISTARIL) tablet 25 mg  25 mg Oral TID PRN Salley Scarlet, MD   25 mg at 04/28/20 0805  . ibuprofen (ADVIL) tablet 600 mg  600 mg Oral Q6H PRN Salley Scarlet, MD   600 mg at 04/28/20 0805  . lidocaine (XYLOCAINE) 5 % ointment   Topical BID PRN Salley Scarlet, MD      . magnesium hydroxide (MILK OF MAGNESIA) suspension 30 mL  30 mL Oral Daily PRN Salley Scarlet, MD      . nicotine polacrilex (NICORETTE) gum 2 mg  2 mg Oral Q4H PRN Salley Scarlet, MD      . QUEtiapine (SEROQUEL) tablet 100 mg  100 mg Oral QHS Salley Scarlet, MD      . rosuvastatin (CRESTOR) tablet 20 mg  20 mg Oral Daily Salley Scarlet, MD   20 mg at 04/28/20 0804  . traZODone (DESYREL) tablet 150 mg  150 mg Oral QHS Salley Scarlet, MD      . ziprasidone (GEODON) injection 20 mg  20 mg Intramuscular Q6H PRN Salley Scarlet, MD       PTA Medications: Medications Prior to Admission  Medication Sig Dispense Refill Last Dose  . bictegravir-emtricitabine-tenofovir AF (BIKTARVY) 50-200-25 MG TABS tablet Take 1  tablet by mouth daily. 30 tablet 6   . gabapentin (NEURONTIN) 300 MG capsule Take orally 2 caps (670m) in the morning and 3 caps (907m in the evening 150 capsule 0   . hydrOXYzine (ATARAX/VISTARIL) 25 MG tablet Take 1 tablet (25 mg total) by mouth 3 (three) times daily as needed for anxiety. 75 tablet 0   . QUEtiapine (SEROQUEL) 100 MG tablet Take 1 tablet (100 mg total) by mouth at bedtime. 30 tablet 0   . rosuvastatin (CRESTOR) 20 MG tablet Take 1 tablet (20 mg total) by mouth daily. 30 tablet 6   . traZODone (DESYREL) 150 MG tablet Take 1 tablet (150 mg total) by mouth at bedtime. 30 tablet 0     Patient Stressors: Medication change or noncompliance Substance abuse  Patient Strengths: Ability for insight Motivation for treatment/growth  Treatment Modalities: Medication Management, Group therapy, Case management,  1 to 1 session with clinician, Psychoeducation, Recreational therapy.   Physician Treatment Plan for Primary Diagnosis: <principal problem not specified> Long Term Goal(s):     Short Term Goals:    Medication Management: Evaluate patient's response, side effects, and tolerance of medication regimen.  Therapeutic Interventions: 1 to 1 sessions, Unit Group sessions and Medication administration.  Evaluation of Outcomes: Not Met  Physician  Treatment Plan for Secondary Diagnosis: Active Problems:   Bipolar affective disorder, mixed, severe, with psychotic behavior ([place])  Long Term Goal(s):     Short Term Goals:       Medication Management: Evaluate patient's response, side effects, and tolerance of medication regimen.  Therapeutic Interventions: 1 to 1 sessions, Unit Group sessions and Medication administration.  Evaluation of Outcomes: Not Met   RN Treatment Plan for Primary Diagnosis: <principal problem not specified> Long Term Goal(s): Knowledge of disease and therapeutic regimen to maintain health will improve  Short Term Goals: Ability to remain free from  injury will improve, Ability to verbalize frustration and anger appropriately will improve, Ability to demonstrate self-control, Ability to participate in decision making will improve, Ability to verbalize feelings will improve, Ability to disclose and discuss suicidal ideas, Ability to identify and develop effective coping behaviors will improve and Compliance with prescribed medications will improve  Medication Management: RN will administer medications as ordered by provider, will assess and evaluate patient's response and provide education to patient for prescribed medication. RN will report any adverse and/or side effects to prescribing provider.  Therapeutic Interventions: 1 on 1 counseling sessions, Psychoeducation, Medication administration, Evaluate responses to treatment, Monitor vital signs and CBGs as ordered, Perform/monitor CIWA, COWS, AIMS and Fall Risk screenings as ordered, Perform wound care treatments as ordered.  Evaluation of Outcomes: Not Met   LCSW Treatment Plan for Primary Diagnosis: <principal problem not specified> Long Term Goal(s): Safe transition to appropriate next level of care at discharge, Engage patient in therapeutic group addressing interpersonal concerns.  Short Term Goals: Engage patient in aftercare planning with referrals and resources, Increase social support, Increase ability to appropriately verbalize feelings, Increase emotional regulation, Facilitate acceptance of mental health diagnosis and concerns, Identify triggers associated with mental health/substance abuse issues and Increase skills for wellness and recovery  Therapeutic Interventions: Assess for all discharge needs, 1 to 1 time with Social worker, Explore available resources and support systems, Assess for adequacy in community support network, Educate family and significant other(s) on suicide prevention, Complete Psychosocial Assessment, Interpersonal group therapy.  Evaluation of Outcomes: Not  Met   Progress in Treatment: Attending groups: No. Participating in groups: No. Taking medication as prescribed: Yes. Toleration medication: Yes. Family/Significant other contact made: No, will contact:  when given permission. Patient understands diagnosis: Yes. Discussing patient identified problems/goals with staff: Yes. Medical problems stabilized or resolved: No. Denies suicidal/homicidal ideation: Yes. Issues/concerns per patient self-inventory: No. Other: None.  New problem(s) identified: No, Describe:  none.  New Short Term/Long Term Goal(s): elimination of symptoms of psychosis, medication management for mood stabilization; elimination of SI thoughts; development of comprehensive mental wellness/sobriety plan.  Patient Goals: "I don't know." He expressed desire to stop feeling tactile hallucinations and to stop hearing the voices.   Discharge Plan or Barriers: CSW will assist pt with development of an appropriate discharge/aftercare plan.  Reason for Continuation of Hospitalization: Hallucinations Medical Issues Medication stabilization Suicidal ideation  Estimated Length of Stay:  1-7 days  Attendees: Patient: Dylan [name] 04/28/2020 10:37 AM  Physician: Selina Cooley, MD 04/28/2020 10:37 AM  Nursing: Amie Critchley, RN 04/28/2020 10:37 AM  RN Care Manager: 04/28/2020 10:37 AM  Social Worker: Chalmers Guest. Guerry Bruin, MSW, [place], [place] 04/28/2020 10:37 AM  Recreational Therapist: Devin Going, LRT  04/28/2020 10:37 AM  Other:  04/28/2020 10:37 AM  Other:  04/28/2020 10:37 AM  Other: 04/28/2020 10:37 AM    Scribe for Treatment Team: [name], LCSW  04/28/2020 10:37 AM

## 2020-04-28 NOTE — Tx Team (Signed)
Initial Treatment Plan 04/28/2020 12:30 AM AADAM ARGUDO [id number]    PATIENT STRESSORS: Medication change or noncompliance Substance abuse   PATIENT STRENGTHS: Ability for insight Motivation for treatment/growth   PATIENT IDENTIFIED PROBLEMS: Psychosis  Depression  Substance Abuse                 DISCHARGE CRITERIA:  Improved stabilization in mood, thinking, and/or behavior Verbal commitment to aftercare and medication compliance  PRELIMINARY DISCHARGE PLAN: Outpatient therapy Return to previous living arrangement  PATIENT/FAMILY INVOLVEMENT: This treatment plan has been presented to and reviewed with the patient, Dylan [name]. The patient has been given the opportunity to ask questions and make suggestions.  Mallie Darting, RN 04/28/2020, 12:30 AM

## 2020-04-28 NOTE — Plan of Care (Signed)
Patient new to the unit tonight, hasn't had time to progress  Problem: Education: Goal: Knowledge of [facility] Education information/materials will improve Outcome: Not Progressing Goal: Emotional status will improve Outcome: Not Progressing Goal: Mental status will improve Outcome: Not Progressing Goal: Verbalization of understanding the information provided will improve Outcome: Not Progressing   Problem: Safety: Goal: Periods of time without injury will increase Outcome: Not Progressing   Problem: Education: Goal: Will be free of psychotic symptoms Outcome: Not Progressing Goal: Knowledge of the prescribed therapeutic regimen will improve Outcome: Not Progressing   Problem: Safety: Goal: Ability to redirect hostility and anger into socially appropriate behaviors will improve Outcome: Not Progressing Goal: Ability to remain free from injury will improve Outcome: Not Progressing
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
## 2020-04-28 NOTE — BHH Group Notes (Signed)
LCSW Group Therapy Note  04/28/2020 3:07 PM  Type of Therapy/Topic:  Group Therapy:  Emotion Regulation  Participation Level:  Did Not Attend   Description of Group:   The purpose of this group is to assist patients in learning to regulate negative emotions and experience positive emotions. Patients will be guided to discuss ways in which they have been vulnerable to their negative emotions. These vulnerabilities will be juxtaposed with experiences of positive emotions or situations, and patients will be challenged to use positive emotions to combat negative ones. Special emphasis will be placed on coping with negative emotions in conflict situations, and patients will process healthy conflict resolution skills.  Therapeutic Goals: 1. Patient will identify two positive emotions or experiences to reflect on in order to balance out negative emotions 2. Patient will label two or more emotions that they find the most difficult to experience 3. Patient will demonstrate positive conflict resolution skills through discussion and/or role plays  Summary of Patient Progress: X  Therapeutic Modalities:   Cognitive Behavioral Therapy Feelings Identification Dialectical Behavioral Therapy  Jaymin Waln R. Guerry Bruin, MSW, [place], [place] 04/28/2020 3:07 PM

## 2020-04-28 NOTE — Progress Notes (Signed)
Recreation Therapy Notes  INPATIENT RECREATION THERAPY ASSESSMENT  Patient Details Name: Dylan [name] MRN: [id number] DOB: [birth year] Today's Date: 04/28/2020       Information Obtained From: Patient  Able to Participate in Assessment/Interview: Yes  Patient Presentation: Responsive  Reason for Admission (Per Patient): Active Symptoms,Med Non-Compliance  Patient Stressors:    Coping Skills:   Impulsivity  Leisure Interests (2+):  Exercise - Judie Grieve - TV,Music - Listen (Walk dog)  Frequency of Recreation/Participation: Weekly  Awareness of Community Resources:  Yes  Community Resources:  [employer]  Current Use: No  If no, Barriers?: Financial,Attitudinal  Expressed Interest in [employer] Information: No  County of Residence:  [place]  Patient Main Form of Transportation: Musician  Patient Strengths:  Good heart  Patient Identified Areas of Improvement:  Stop yelling at people  Patient Goal for Hospitalization:  Get back on track with my medication  Current SI (including self-harm):  No  Current HI:  No  Current AVH: No  Staff Intervention Plan: Group Attendance,Collaborate with Interdisciplinary Treatment Team  Consent to Intern Participation: N/A  Dylan [name] 04/28/2020, 12:07 PM

## 2020-04-29 DIAGNOSIS — F251 Schizoaffective disorder, depressive type: Secondary | ICD-10-CM | POA: Diagnosis not present

## 2020-04-29 MED ORDER — QUETIAPINE FUMARATE 200 MG PO TABS: 200.0000 mg | ORAL_TABLET | Freq: Every day | ORAL | 1 refills | Status: DC

## 2020-04-29 NOTE — Discharge Summary (Signed)
Physician Discharge Summary Note  Patient:  Dylan [name] is an 55 y.o., male MRN:  [id number] DOB:  1965-11-26 Patient phone:  [phone] (home)  Patient address:   [address] [place] [address],  Total Time spent with patient: 35 minutes- 25 minutes face-to-face contact with patient, 10 minutes documentation, coordination of care, scripts   Date of Admission:  04/28/2020 Date of Discharge: 04/29/2020  Reason for Admission:  55 year old male with history of HIV on Biktarvy, chronic hepatitits B, schizoaffective disorder and anxiety presenting with suicidal ideations with plan to jump off a bridge.  Principal Problem: Schizoaffective disorder, depressive type [facility]) Discharge Diagnoses: Principal Problem:   Schizoaffective disorder, depressive type ([place]) Active Problems:   HIV disease ([place])   Elevated LFTs   Chronic 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 coma (HCC)   Generalized anxiety disorder   Insomnia due to other mental disorder   Past Psychiatric History:  Schizoaffective disorder, anxiety. He has two prior hospitalizations, last in Feb 2022. He was previously on seroquel, hydroxyzine, neurontin, and wellbutrin. Last seen by outpatient provider on 03/17/20. Three previous suicide attempts via overdose.   Past Medical History:  Past Medical History:  Diagnosis Date  . Anxiety   . Arthritis   . Bipolar 1 disorder ([place])   . Colon polyps   . Depression   . GERD (gastroesophageal reflux disease)   . Hepatitis B   . Human immunodeficiency virus (HIV) ([place])   . Hyperlipidemia   . Hypertension   . Neuromuscular disorder (HCC)    neuropathy  . Neuropathy   . Pre-diabetes   . Schizophrenia [facility])     Past Surgical History:  Procedure Laterality Date  . FOOT ARTHRODESIS Right 09/17/2019   Procedure: FUSION RIGHT LISFRANC JOINT;  Surgeon: Newt Minion, MD;  Location: [facility];  Service: Orthopedics;  Laterality: Right;  . FOOT ARTHRODESIS Right 09/2019   . HEMORROIDECTOMY    . IR FLUORO GUIDE CV LINE RIGHT  02/22/2017  . IR US GUIDE VASC ACCESS RIGHT  02/22/2017  . TUMOR REMOVAL     From Chest   Family History:  Family History  Problem Relation Age of Onset  . CAD Mother   . Diabetes Father   . Colon cancer Maternal Aunt 53  . Diabetes Maternal Aunt   . Heart disease Maternal Uncle   . Esophageal cancer Neg Hx   . Rectal cancer Neg Hx   . Stomach cancer Neg Hx    Family Psychiatric  History: Unspecified psychiatric illness in mother and uncle Social History:  Social History   Substance and Sexual Activity  Alcohol Use Yes   Comment: occassional use on weekends     Social History   Substance and Sexual Activity  Drug Use Yes  . Types: Cocaine, Marijuana   Comment: last use was Friday 03/05/2020    Social History   Socioeconomic History  . Marital status: Married    Spouse name: Not on file  . Number of children: 1  . Years of education: Not on file  . Highest education level: Not on file  Occupational History  . Not on file  Tobacco Use  . Smoking status: Current Every Day Smoker    Packs/day: 0.50    Years: 15.00    Pack years: 7.50    Types: E-cigarettes  . Smokeless tobacco: Never Used  . Tobacco comment: has patches when he is ready to quit  Vaping Use  .  Vaping Use: Some days  . Substances: Nicotine  Substance and Sexual Activity  . Alcohol use: Yes    Comment: occassional use on weekends  . Drug use: Yes    Types: Cocaine, Marijuana    Comment: last use was Friday 03/05/2020  . Sexual activity: Yes    Partners: Female    Birth control/protection: Condom    Comment: condoms given  Other Topics Concern  . Not on file  Social History Narrative  . Not on file   Social Determinants of Health   Financial Resource Strain: Not on file  Food Insecurity: Not on file  Transportation Needs: Not on file  Physical Activity: Not on file  Stress: Not on file  Social Connections: Not on file     Hospital Course:   55 year old male with history of HIV on Biktarvy, chronic hepatitits B, schizoaffective disorder and anxiety presenting with suicidal ideations with plan to jump off a bridge. Worsening mood and hallucinations occurred in the context of medication noncompliance and illicit substance abuse of cocaine. Hallucinations and suicidal ideations resolved after period of sobriety and restarting home medications. Seroquel was increased to 200 mg QHS for hallucinations and mood. Liver enzymes noted to be elevated above baseline, but repeat hepatitis panel negative. Likely elevated secondary to drug use and chronic underlying hepatitis B. Advise to avoid hepatotoxic medications such as Tylenol upon discharge. He denies suicidal ideations, homicidal ideations, visual hallucinations, and auditory hallucinations. He feels his depression is also improved. He is future oriented with plan to return to fiance, find his lost phone, and go to the [place] on vacation in June.   Physical Findings: AIMS: Facial and Oral Movements Muscles of Facial Expression: None, normal Lips and Perioral Area: None, normal Jaw: None, normal Tongue: None, normal,Extremity Movements Upper (arms, wrists, hands, fingers): None, normal Lower (legs, knees, ankles, toes): None, normal, Trunk Movements Neck, shoulders, hips: None, normal, Overall Severity Severity of abnormal movements (highest score from questions above): None, normal Incapacitation due to abnormal movements: None, normal Patient's awareness of abnormal movements (rate only patient's report): No Awareness, Dental Status Current problems with teeth and/or dentures?: No Does patient usually wear dentures?: No  CIWA:  CIWA-Ar Total: 0 COWS:  COWS Total Score: 0  Musculoskeletal: Strength & Muscle Tone: within normal limits Gait & Station: normal Patient leans: N/A  Psychiatric Specialty Exam: General Appearance: Casual  Eye Contact::   Fair  Speech:  Clear and Coherent and Normal Rate  Volume:  Normal  Mood:  Euthymic  Affect:  Congruent  Thought Process:  Coherent and Linear  Orientation:  Full (Time, Place, and Person)  Thought Content:  Logical  Suicidal Thoughts:  No  Homicidal Thoughts:  No  Memory:  Immediate;   Fair Recent;   Fair Remote;   Fair  Judgement:  Intact  Insight:  Fair  Psychomotor Activity:  Normal  Concentration:  Fair  Recall:  [employer] of Knowledge:Fair  Language: Fair  Akathisia:  Negative  Handed:  Right  AIMS (if indicated):     Assets:  Communication Skills Desire for Improvement Financial Resources/Insurance Housing Intimacy Leisure Time Resilience Social Support Transportation  Sleep:  Number of Hours: 8  Cognition: WNL  ADL's:  Intact      Physical Exam: Physical Exam Vitals and nursing note reviewed.  Constitutional:      Appearance: Normal appearance.  HENT:     Head: Normocephalic and atraumatic.     Right Ear: External ear normal.  Left Ear: External ear normal.     Nose: Nose normal.     Mouth/Throat:     Mouth: Mucous membranes are moist.     Pharynx: Oropharynx is clear.  Eyes:     Extraocular Movements: Extraocular movements intact.     Conjunctiva/sclera: Conjunctivae normal.     Pupils: Pupils are equal, round, and reactive to light.  Cardiovascular:     Rate and Rhythm: Normal rate.     Pulses: Normal pulses.  Pulmonary:     Effort: Pulmonary effort is normal.     Breath sounds: Normal breath sounds.  Abdominal:     General: Abdomen is flat.     Palpations: Abdomen is soft.  Musculoskeletal:     Cervical back: Normal range of motion and neck supple.     Comments: Swelling to right foot improved, ambulating with less of a limp today  Skin:    General: Skin is warm and dry.  Neurological:     General: No focal deficit present.     Mental Status: He is alert and oriented to person, place, and time.  Psychiatric:        Mood and  Affect: Mood normal.        Behavior: Behavior normal.        Thought Content: Thought content normal.        Judgment: Judgment normal.    Review of Systems  Constitutional: Negative.   HENT: Negative.   Eyes: Negative.   Respiratory: Negative.   Cardiovascular: Negative.   Gastrointestinal: Negative.   Endocrine: Negative.   Genitourinary: Negative.   Musculoskeletal: Negative.   Skin: Negative.   Allergic/Immunologic: Positive for environmental allergies. Negative for food allergies.  Neurological: Negative.   Hematological: Negative.   Psychiatric/Behavioral: Negative for hallucinations, self-injury, sleep disturbance and suicidal ideas.  Blood pressure 125/67, pulse 70, temperature 97.8 F (36.6 C), temperature source Oral, resp. rate 17, height '6\' 2"'$  (1.88 m), weight 105.2 kg, SpO2 98 %. Body mass index is 29.79 kg/m.   Have you used any form of tobacco in the last 30 days? (Cigarettes, Smokeless Tobacco, Cigars, and/or Pipes): Yes  Has this patient used any form of tobacco in the last 30 days? (Cigarettes, Smokeless Tobacco, Cigars, and/or Pipes)  Yes, A prescription for an FDA-approved tobacco cessation medication was offered at discharge and the patient refused  Blood Alcohol level:  Lab Results  Component Value Date   [facility] <10 04/27/2020   ETH <10 [id number]    Metabolic Disorder Labs:  Lab Results  Component Value Date   HGBA1C 6.2 (H) 04/28/2020   MPG 131.24 04/28/2020   MPG 125.5 03/08/2020   Lab Results  Component Value Date   PROLACTIN 12.1 03/08/2020   PROLACTIN 2.6 (L) 11/09/2019   Lab Results  Component Value Date   CHOL 177 04/28/2020   TRIG 105 04/28/2020   HDL 55 04/28/2020   CHOLHDL 3.2 04/28/2020   VLDL 21 04/28/2020   LDLCALC 101 (H) 04/28/2020   LDLCALC 172 (H) 03/08/2020    See Psychiatric Specialty Exam and Suicide Risk Assessment completed by Attending Physician prior to discharge.  Discharge destination:  Home  Is patient on  multiple antipsychotic therapies at discharge:  No   Has Patient had three or more failed trials of antipsychotic monotherapy by history:  No  Recommended Plan for Multiple Antipsychotic Therapies: NA  Discharge Instructions    Diet - low sodium heart healthy   Complete by: As directed  Increase activity slowly   Complete by: As directed      Allergies as of 04/29/2020      Reactions   Penicillin G    Penicillins Other (See Comments)   Childhood allergy   Latex Rash   Tape Rash      Medication List    TAKE these medications     Indication  Biktarvy 50-200-25 MG Tabs tablet Generic drug: bictegravir-emtricitabine-tenofovir AF Take 1 tablet by mouth daily.  Indication: HIV Disease   gabapentin 300 MG capsule Commonly known as: NEURONTIN Take orally 2 caps ('600mg'$ ) in the morning and 3 caps ('900mg'$ ) in the evening  Indication: Fibromyalgia Syndrome   hydrOXYzine 25 MG tablet Commonly known as: ATARAX/VISTARIL Take 1 tablet (25 mg total) by mouth 3 (three) times daily as needed for anxiety.  Indication: Feeling Anxious   QUEtiapine 200 MG tablet Commonly known as: SEROQUEL Take 1 tablet (200 mg total) by mouth at bedtime. What changed:   medication strength  how much to take  Indication: Major Depressive Disorder   rosuvastatin 20 MG tablet Commonly known as: CRESTOR Take 1 tablet (20 mg total) by mouth daily.  Indication: High Amount of Fats in the Blood   traZODone 150 MG tablet Commonly known as: DESYREL Take 1 tablet (150 mg total) by mouth at bedtime.  Indication: Network engineer  (From admission, onward)         Start     Ordered   04/28/20 1603  For home use only DME Walker rolling  Once       Question Answer Comment  Walker: With 5 Inch Wheels   Patient needs a walker to treat with the following condition Status post right foot surgery      04/28/20 1604          Follow-up [facility] .   Specialty: Urgent Care Contact information: [place] [phone]              Follow-up recommendations:  Activity:  as tolerated Diet:  low sodium heart healthy diet  Comments:  Printed 30-day script with 1 refill provided for increase dose of Seroquel  Signed: Salley Scarlet, MD 04/29/2020, 11:07 AM

## 2020-04-29 NOTE — Plan of Care (Signed)
Pt rates depression and anxiety both 2/10. Pt denies SI, HI and VH. Pt endorses AH. Pt was educated on care plan and verbalizes understanding. Collier Bullock RN Problem: Education: Goal: Knowledge of [facility] Education information/materials will improve Outcome: Progressing Goal: Emotional status will improve Outcome: Progressing Goal: Mental status will improve Outcome: Progressing Goal: Verbalization of understanding the information provided will improve Outcome: Progressing   Problem: Safety: Goal: Periods of time without injury will increase Outcome: Progressing   Problem: Education: Goal: Will be free of psychotic symptoms Outcome: Progressing Goal: Knowledge of the prescribed therapeutic regimen will improve Outcome: Progressing   Problem: Safety: Goal: Ability to redirect hostility and anger into socially appropriate behaviors will improve Outcome: Progressing Goal: Ability to remain free from injury will improve Outcome: Progressing

## 2020-04-29 NOTE — Progress Notes (Signed)
Pt refused to sign the valuable sheet staing that he was going to be receiving all items that he came with. Dylan [name]

## 2020-04-29 NOTE — Progress Notes (Signed)
Pt was educated on prescriptions and follow up care. Pt questions were answered and pt verbalized understanding and did not voice any concerns. Pt's belongings were returned. Pt was safely discharged to [facility] by [facility]. Patient was not observed to be in any distress at time of discharge.

## 2020-04-29 NOTE — Progress Notes (Signed)
Pt refused medications this morning a second time. MD aware. Collier Bullock RN

## 2020-04-29 NOTE — Progress Notes (Addendum)
Knee brace was brought to the unit. MD and pt were notified. Pt did not want it use it. Collier Bullock RN

## 2020-04-29 NOTE — BHH Suicide Risk Assessment (Signed)
[facility] Discharge Suicide Risk Assessment   Principal Problem: Schizoaffective disorder, depressive type ([place]) Discharge Diagnoses: Principal Problem:   Schizoaffective disorder, depressive type ([place]) Active Problems:   HIV disease ([place])   Elevated LFTs   Chronic 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 coma (HCC)   Generalized anxiety disorder   Insomnia due to other mental disorder   Total Time spent with patient: 35 minutes- 25 minutes face-to-face contact with patient, 10 minutes documentation, coordination of care, scripts   Musculoskeletal: Strength & Muscle Tone: within normal limits Gait & Station: normal Patient leans: N/A  Psychiatric Specialty Exam: Review of Systems  Constitutional: Negative.   HENT: Negative.   Eyes: Negative.   Respiratory: Negative.   Cardiovascular: Negative.   Gastrointestinal: Negative.   Endocrine: Negative.   Genitourinary: Negative.   Musculoskeletal: Negative.   Skin: Negative.   Allergic/Immunologic: Positive for environmental allergies. Negative for food allergies.  Neurological: Negative.   Hematological: Negative.   Psychiatric/Behavioral: Negative for hallucinations, self-injury, sleep disturbance and suicidal ideas.    Blood pressure 125/67, pulse 70, temperature 97.8 F (36.6 C), temperature source Oral, resp. rate 17, height '6\' 2"'$  (1.88 m), weight 105.2 kg, SpO2 98 %.Body mass index is 29.79 kg/m.  General Appearance: Casual  Eye Contact::  Fair  Speech:  Clear and Coherent and Normal Rate  Volume:  Normal  Mood:  Euthymic  Affect:  Congruent  Thought Process:  Coherent and Linear  Orientation:  Full (Time, Place, and Person)  Thought Content:  Logical  Suicidal Thoughts:  No  Homicidal Thoughts:  No  Memory:  Immediate;   Fair Recent;   Fair Remote;   Fair  Judgement:  Intact  Insight:  Fair  Psychomotor Activity:  Normal  Concentration:  Fair  Recall:  [employer] of Knowledge:Fair  Language: Fair   Akathisia:  Negative  Handed:  Right  AIMS (if indicated):     Assets:  Communication Skills Desire for Improvement Financial Resources/Insurance Housing Intimacy Leisure Time Resilience Social Support Transportation  Sleep:  Number of Hours: 8  Cognition: WNL  ADL's:  Intact   Mental Status Per Nursing Assessment::   On Admission:  NA  Demographic Factors:  Male  Loss Factors: NA  Historical Factors: Impulsivity  Risk Reduction Factors:   Sense of responsibility to family, Living with another person, especially a relative, Positive social support, Positive therapeutic relationship and Positive coping skills or problem solving skills  Continued Clinical Symptoms:  Schizophrenia:   Paranoid or undifferentiated type Previous Psychiatric Diagnoses and Treatments Medical Diagnoses and Treatments/Surgeries  Cognitive Features That Contribute To Risk:  None    Suicide Risk:  Minimal: No identifiable suicidal ideation.  Patients presenting with no risk factors but with morbid ruminations; may be classified as minimal risk based on the severity of the depressive symptoms    Plan Of Care/Follow-up recommendations:  Activity:  as tolerated Diet:  low sodium heart healthy diet  Salley Scarlet, MD 04/29/2020, 10:47 AM

## 2020-04-29 NOTE — Progress Notes (Signed)
  [facility] Adult Case Management Discharge Plan :  Will you be returning to the same living situation after discharge:  Yes,  pt's home At discharge, do you have transportation home?: Yes,  [facility] Do you have the ability to pay for your medications: Yes,  Insurance-medicaid  Release of information consent forms completed and in the chart;  Patient's signature needed at discharge.  Patient to Follow up at:  [facility] Follow up on 06/03/2020.   Specialty: Urgent Care Why: Go to appointment scheduled Thursday May 26th 1:30pm with Dr. Ileene Musa (in-person) Contact information: [place] [phone]              Next level of care provider has access to [place] and Suicide Prevention discussed: Yes,  pt's partner  Have you used any form of tobacco in the last 30 days? (Cigarettes, Smokeless Tobacco, Cigars, and/or Pipes): Yes  Has patient been referred to the Quitline?: Patient refused referral  Patient has been referred for addiction treatment: Pt. refused referral  Adalene Gulotta A [place], [place] 04/29/2020, 11:25 AM

## 2020-04-29 NOTE — Progress Notes (Signed)
Recreation Therapy Notes  Date: 04/29/2020  Time: 10:00 am   Location: Craft room     Behavioral response: N/A   Intervention Topic: Emotions    Discussion/Intervention: Patient did not attend group.   Clinical Observations/Feedback:  Patient did not attend group.   Romy Ipock LRT/CTRS        Santonio Speakman 04/29/2020 11:44 AM

## 2020-04-30 ENCOUNTER — Encounter: Payer: Self-pay | Admitting: Orthopedic Surgery

## 2020-06-03 ENCOUNTER — Encounter (HOSPITAL_COMMUNITY): Payer: Medicaid Other | Admitting: Physician Assistant

## 2020-06-08 ENCOUNTER — Encounter: Payer: Self-pay | Admitting: Orthopedic Surgery

## 2020-06-08 ENCOUNTER — Encounter: Payer: Self-pay | Admitting: Family Medicine

## 2020-06-10 ENCOUNTER — Ambulatory Visit: Payer: Self-pay | Admitting: Orthopedic Surgery

## 2020-06-21 ENCOUNTER — Encounter: Payer: Self-pay | Admitting: Family Medicine

## 2020-06-21 ENCOUNTER — Other Ambulatory Visit: Payer: Self-pay | Admitting: Family Medicine

## 2020-06-21 MED ORDER — DULOXETINE HCL 60 MG PO CPEP: 60.0000 mg | ORAL_CAPSULE | Freq: Every day | ORAL | 3 refills | Status: DC

## 2020-06-22 ENCOUNTER — Other Ambulatory Visit: Payer: Self-pay | Admitting: Family Medicine

## 2020-06-22 DIAGNOSIS — F99 Mental disorder, not otherwise specified: Secondary | ICD-10-CM

## 2020-06-22 DIAGNOSIS — F5105 Insomnia due to other mental disorder: Secondary | ICD-10-CM

## 2020-06-22 DIAGNOSIS — G629 Polyneuropathy, unspecified: Secondary | ICD-10-CM

## 2020-06-22 MED ORDER — GABAPENTIN 300 MG PO CAPS: ORAL_CAPSULE | ORAL | 3 refills | Status: DC

## 2020-07-02 ENCOUNTER — Other Ambulatory Visit: Payer: Self-pay

## 2020-07-02 DIAGNOSIS — B20 Human immunodeficiency virus [HIV] disease: Secondary | ICD-10-CM

## 2020-07-02 DIAGNOSIS — Z113 Encounter for screening for infections with a predominantly sexual mode of transmission: Secondary | ICD-10-CM

## 2020-07-06 ENCOUNTER — Other Ambulatory Visit: Payer: Medicaid Other

## 2020-07-06 ENCOUNTER — Other Ambulatory Visit (HOSPITAL_COMMUNITY)
Admission: RE | Admit: 2020-07-06 | Discharge: 2020-07-06 | Disposition: A | Discharge status: Home / Self Care | Payer: Medicaid Other | Source: Ambulatory Visit | Attending: Internal Medicine | Admitting: Internal Medicine

## 2020-07-06 ENCOUNTER — Other Ambulatory Visit: Payer: Self-pay

## 2020-07-06 DIAGNOSIS — Z113 Encounter for screening for infections with a predominantly sexual mode of transmission: Secondary | ICD-10-CM

## 2020-07-06 DIAGNOSIS — B20 Human immunodeficiency virus [HIV] disease: Secondary | ICD-10-CM

## 2020-07-07 ENCOUNTER — Other Ambulatory Visit: Payer: Self-pay | Admitting: Family Medicine

## 2020-07-07 ENCOUNTER — Encounter: Payer: Self-pay | Admitting: Family Medicine

## 2020-07-07 LAB — URINE CYTOLOGY ANCILLARY ONLY
Chlamydia: NEGATIVE
Comment: NEGATIVE
Comment: NORMAL
Neisseria Gonorrhea: NEGATIVE

## 2020-07-07 LAB — T-HELPER CELL (CD4) - (RCID CLINIC ONLY)
CD4 % Helper T Cell: 38 % (ref 33–65)
CD4 T Cell Abs: 648 /uL (ref 400–1790)

## 2020-07-07 MED ORDER — SILDENAFIL CITRATE 50 MG PO TABS
50.0000 mg | ORAL_TABLET | Freq: Every day | ORAL | 1 refills | Status: DC | PRN
Start: 2020-07-07 — End: 2020-10-26

## 2020-07-09 LAB — COMPLETE METABOLIC PANEL WITH GFR
AG Ratio: 1.5 (calc) (ref 1.0–2.5)
ALT: 33 U/L (ref 9–46)
AST: 32 U/L (ref 10–35)
Albumin: 4.1 g/dL (ref 3.6–5.1)
Alkaline phosphatase (APISO): 87 U/L (ref 35–144)
BUN: 15 mg/dL (ref 7–25)
CO2: 24 mmol/L (ref 20–32)
Calcium: 9.2 mg/dL (ref 8.6–10.3)
Chloride: 107 mmol/L (ref 98–110)
Creat: 1.06 mg/dL (ref 0.70–1.33)
GFR, Est African American: 92 mL/min/{1.73_m2} (ref >=60)
GFR, Est Non African American: 79 mL/min/{1.73_m2} (ref >=60)
Globulin: 2.7 g/dL (calc) (ref 1.9–3.7)
Glucose, Bld: 95 mg/dL (ref 65–99)
Potassium: 4.3 mmol/L (ref 3.5–5.3)
Sodium: 138 mmol/L (ref 135–146)
Total Bilirubin: 0.4 mg/dL (ref 0.2–1.2)
Total Protein: 6.8 g/dL (ref 6.1–8.1)

## 2020-07-09 LAB — CBC WITH DIFFERENTIAL/PLATELET
Absolute Monocytes: 351 cells/uL (ref 200–950)
Basophils Absolute: 18 cells/uL (ref 0–200)
Basophils Relative: 0.4 %
Eosinophils Absolute: 171 cells/uL (ref 15–500)
Eosinophils Relative: 3.8 %
HCT: 41 % (ref 38.5–50.0)
Hemoglobin: 13.7 g/dL (ref 13.2–17.1)
Lymphs Abs: 1778 cells/uL (ref 850–3900)
MCH: 30.4 pg (ref 27.0–33.0)
MCHC: 33.4 g/dL (ref 32.0–36.0)
MCV: 91.1 fL (ref 80.0–100.0)
MPV: 11.7 fL (ref 7.5–12.5)
Monocytes Relative: 7.8 %
Neutro Abs: 2183 cells/uL (ref 1500–7800)
Neutrophils Relative %: 48.5 %
Platelets: 163 10*3/uL (ref 140–400)
RBC: 4.5 10*6/uL (ref 4.20–5.80)
RDW: 12.9 % (ref 11.0–15.0)
Total Lymphocyte: 39.5 %
WBC: 4.5 10*3/uL (ref 3.8–10.8)

## 2020-07-09 LAB — RPR: RPR Ser Ql: NONREACTIVE

## 2020-07-09 LAB — HIV-1 RNA QUANT-NO REFLEX-BLD
HIV 1 RNA Quant: NOT DETECTED Copies/mL
HIV-1 RNA Quant, Log: NOT DETECTED Log cps/mL

## 2020-07-14 ENCOUNTER — Encounter: Payer: Self-pay | Admitting: Internal Medicine

## 2020-07-14 ENCOUNTER — Ambulatory Visit (INDEPENDENT_AMBULATORY_CARE_PROVIDER_SITE_OTHER): Payer: Medicaid Other | Admitting: Internal Medicine

## 2020-07-14 ENCOUNTER — Other Ambulatory Visit: Payer: Self-pay

## 2020-07-14 VITALS — Wt 233.0 lb

## 2020-07-14 DIAGNOSIS — G629 Polyneuropathy, unspecified: Secondary | ICD-10-CM

## 2020-07-14 NOTE — Progress Notes (Signed)
RFV: follow up for hiv disease  Patient ID: Dylan [name], male   DOB: Sep 15, 1965, 55 y.o.   MRN: [id number]  HPI 55yo M with HIV disease, CD 4 count  Peripheral neuropathy improved Weight loss-intentional  Doing well  Went on cruise  Outpatient Encounter Medications as of 07/14/2020  Medication Sig   bictegravir-emtricitabine-tenofovir AF (BIKTARVY) 50-200-25 MG TABS tablet Take 1 tablet by mouth daily.   DULoxetine (CYMBALTA) 60 MG capsule Take 1 capsule (60 mg total) by mouth daily. For neuropathy   gabapentin (NEURONTIN) 300 MG capsule Take orally 2 caps ('600mg'$ ) in the morning and 3 caps ('900mg'$ ) in the evening   hydrOXYzine (ATARAX/VISTARIL) 25 MG tablet Take 1 tablet (25 mg total) by mouth 3 (three) times daily as needed for anxiety.   QUEtiapine (SEROQUEL) 200 MG tablet Take 1 tablet (200 mg total) by mouth at bedtime.   rosuvastatin (CRESTOR) 20 MG tablet Take 1 tablet (20 mg total) by mouth daily.   sildenafil (VIAGRA) 50 MG tablet Take 1 tablet (50 mg total) by mouth daily as needed for erectile dysfunction. At least 24 hours between doses   traZODone (DESYREL) 150 MG tablet Take 1 tablet (150 mg total) by mouth at bedtime.   No facility-administered encounter medications on file as of 07/14/2020.     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 coma ([place])    AKI (acute kidney injury) ([place]) 02/19/2017   Rhabdomyolysis  02/19/2017   Suicidal overdose ([place]) 02/19/2017   Acute hepatitis 02/19/2017   Elevated LFTs    Schizoaffective disorder, depressive type ([place]) 09/05/2011   Degenerative disc disease 08/21/2011   HIV disease ([place]) 08/17/2011     Health Maintenance Due  Topic Date Due   Pneumococcal Vaccine 50-4 Years old (1 - PCV) Never done   Zoster Vaccines- Shingrix (1 of 2) Never done   COVID-19 Vaccine (4 - Booster for Pfizer series) 03/23/2020     Review of Systems  Physical Exam   Wt 233 lb (105.7 kg)   BMI 29.92 kg/m    Lab Results  Component Value Date   CD4TCELL 38 07/06/2020   Lab Results  Component Value Date   CD4TABS 648 07/06/2020   CD4TABS 797 03/24/2020   CD4TABS 1,126 10/15/2019   Lab Results  Component Value Date   HIV1RNAQUANT Not Detected 07/06/2020   Lab Results  Component Value Date   HEPBSAB NON-REACTIVE 07/24/2017   Lab Results  Component Value Date   LABRPR NON-REACTIVE 07/06/2020    CBC Lab Results  Component Value Date   WBC 4.5 07/06/2020   RBC 4.50 07/06/2020   HGB 13.7 07/06/2020   HCT 41.0 07/06/2020   PLT 163 07/06/2020   MCV 91.1 07/06/2020   MCH 30.4 07/06/2020   MCHC 33.4 07/06/2020   RDW 12.9 07/06/2020   LYMPHSABS 1,778 07/06/2020   MONOABS 1.0 04/27/2020   EOSABS 171 07/06/2020    BMET Lab Results  Component Value Date   NA 138 07/06/2020   K 4.3 07/06/2020   CL 107 07/06/2020   CO2 24 07/06/2020   GLUCOSE 95 07/06/2020   BUN 15 07/06/2020   CREATININE 1.06 07/06/2020   CALCIUM 9.2 07/06/2020   GFRNONAA 79 07/06/2020   GFRAA 92 07/06/2020      Assessment and Plan  Refill crestor Has had 3 covid vaccines.   Peripheral neuropathy = initially had noticed when he had AKI hospitalization, but worsening over the year. Will do labs to ensure that it is not due to hormone or thyroid deficiency. Gabapentin and cymbalta

## 2020-07-15 LAB — THYROID PANEL WITH TSH
Free Thyroxine Index: 1.9 (ref 1.4–3.8)
T3 Uptake: 29 % (ref 22–35)
T4, Total: 6.4 ug/dL (ref 4.9–10.5)
TSH: 1.59 mIU/L (ref 0.40–4.50)

## 2020-07-15 LAB — VITAMIN B12: Vitamin B-12: 403 pg/mL (ref 200–1100)

## 2020-07-15 LAB — SEDIMENTATION RATE: Sed Rate: 2 mm/h (ref 0–20)

## 2020-07-25 ENCOUNTER — Encounter: Payer: Self-pay | Admitting: Orthopedic Surgery

## 2020-07-28 ENCOUNTER — Ambulatory Visit (INDEPENDENT_AMBULATORY_CARE_PROVIDER_SITE_OTHER): Payer: Medicaid Other

## 2020-07-28 ENCOUNTER — Ambulatory Visit (INDEPENDENT_AMBULATORY_CARE_PROVIDER_SITE_OTHER): Payer: Medicaid Other | Admitting: Physician Assistant

## 2020-07-28 ENCOUNTER — Encounter: Payer: Self-pay | Admitting: Physician Assistant

## 2020-07-28 DIAGNOSIS — S93324A Dislocation of tarsometatarsal joint of right foot, initial encounter: Secondary | ICD-10-CM

## 2020-07-28 DIAGNOSIS — M79671 Pain in right foot: Secondary | ICD-10-CM

## 2020-07-28 DIAGNOSIS — S93324D Dislocation of tarsometatarsal joint of right foot, subsequent encounter: Secondary | ICD-10-CM

## 2020-07-28 MED ORDER — MELOXICAM 7.5 MG PO TABS: 7.5000 mg | ORAL_TABLET | Freq: Every day | ORAL | 0 refills | Status: DC

## 2020-07-28 MED ORDER — MELOXICAM 15 MG PO TABS: 15.0000 mg | ORAL_TABLET | Freq: Every day | ORAL | 0 refills | Status: DC

## 2020-07-28 NOTE — Progress Notes (Signed)
Office Visit Note   Patient: Dylan [name]           Date of Birth: 12/23/1965           MRN: [id number] Visit Date: 07/28/2020              Requested by: Charlott Rakes, MD [place],  [place] [address] PCP: Charlott Rakes, MD  No chief complaint on file.     HPI: Patient is a pleasant 55 year old gentleman who is status post fusion right Lisfranc joint last year.  He states that he has some good days and some bad days.  He also continues to complain of swelling.  He focuses his issues over the medial side of his foot.  He also is concerned that one of his toes seems discolored.  Assessment & Plan: Visit Diagnoses:  1. Lisfranc dislocation, right, initial encounter   2. Dislocation of tarsometatarsal joint of right foot, subsequent encounter   3. Right foot pain     Plan: Patient will require a custom arch support given the nature of his fusion.  I think he does have a fibrinous effusion.  He would like to try orthotics first which she never did get previously.  We will follow-up if these do not help.  Also ordered some meloxicam  Follow-Up Instructions: No follow-ups on file.   Ortho Exam  Patient is alert, oriented, no adenopathy, well-dressed, normal affect, normal respiratory effort. Right foot: No swelling no erythema no cellulitis well-healed surgical incision he has an easily palpable dorsalis pedis pulse.  Toe in question no necrosis brisk capillary refill of 2 seconds.  Toe is warm.  Just slight discoloration.  He is nontender to palpation today.  No signs of infection  Imaging: No results found. No images are attached to the encounter.  Labs: Lab Results  Component Value Date   HGBA1C 6.2 (H) 04/28/2020   HGBA1C 6.0 (H) 03/08/2020   HGBA1C 5.9 (H) 11/09/2019   ESRSEDRATE 2 07/14/2020   REPTSTATUS 03/03/2017 FINAL 02/26/2017   CULT  02/26/2017    NO GROWTH 5 DAYS Performed at [facility], [place] [address] [place], [place]  [address]      Lab Results  Component Value Date   ALBUMIN 4.7 04/27/2020   ALBUMIN 4.5 03/07/2020   ALBUMIN 4.5 11/09/2019    Lab Results  Component Value Date   MG 2.8 (H) 02/20/2017   No results found for: VD25OH  No results found for: PREALBUMIN CBC EXTENDED Latest Ref Rng & Units 07/06/2020 04/27/2020 03/24/2020  WBC 3.8 - 10.8 Thousand/uL 4.5 13.5(H) 6.7  RBC 4.20 - 5.80 Million/uL 4.50 4.69 4.75  HGB 13.2 - 17.1 g/dL 13.7 14.6 14.7  HCT 38.5 - 50.0 % 41.0 42.8 43.1  PLT 140 - 400 Thousand/uL 163 195 189  NEUTROABS 1,500 - 7,800 cells/uL 2,183 10.4(H) 3,377  LYMPHSABS 850 - 3,900 cells/uL 1,778 2.0 2,137     There is no height or weight on file to calculate BMI.  Orders:  Orders Placed This Encounter  Procedures   XR Foot Complete Right   Meds ordered this encounter  Medications   DISCONTD: meloxicam (MOBIC) 15 MG tablet    Sig: Take 1 tablet (15 mg total) by mouth daily.    Dispense:  30 tablet    Refill:  0   meloxicam (MOBIC) 7.5 MG tablet    Sig: Take 1 tablet (7.5 mg total) by mouth daily.    Dispense:  30 tablet    Refill:  0     Procedures: No procedures performed  Clinical Data: No additional findings.  ROS:  All other systems negative, except as noted in the HPI. Review of Systems  Objective: Vital Signs: There were no vitals taken for this visit.  Specialty Comments:  No specialty comments available.  PMFS History: Patient Active Problem List   Diagnosis Date Noted   Bipolar affective disorder, mixed, severe, with psychotic behavior ([place]) 04/28/2020   Fatty liver 03/09/2020   Schizophrenia ([place]) 03/08/2020   Generalized anxiety disorder 02/03/2020   Insomnia due to other mental disorder 02/03/2020   Lisfranc dislocation, right, initial encounter    Prediabetes 07/22/2019   Tobacco abuse 07/10/2018   ESRD needing dialysis ([place]) 03/03/2018   Schizoaffective disorder, bipolar type ([place])    Acute renal failure (ARF) (HCC)     Suicidal ideations    Chronic 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 coma (HCC)    AKI (acute kidney injury) ([place]) 02/19/2017   Rhabdomyolysis 02/19/2017   Suicidal overdose ([place]) 02/19/2017   Acute hepatitis 02/19/2017   Elevated LFTs    Schizoaffective disorder, depressive type ([place]) 09/05/2011   Degenerative disc disease 08/21/2011   HIV disease ([place]) 08/17/2011   Past Medical History:  Diagnosis Date   Anxiety    Arthritis    Bipolar 1 disorder (HCC)    Colon polyps    Depression    GERD (gastroesophageal reflux disease)    Hepatitis B    Human immunodeficiency virus (HIV) ([place])    Hyperlipidemia    Hypertension    Neuromuscular disorder ([place])    neuropathy   Neuropathy    Pre-diabetes    Schizophrenia ([place])     Family History  Problem Relation Age of Onset   CAD Mother    Diabetes Father    Colon cancer Maternal Aunt 60   Diabetes Maternal Aunt    Heart disease Maternal Uncle    Esophageal cancer Neg Hx    Rectal cancer Neg Hx    Stomach cancer Neg Hx     Past Surgical History:  Procedure Laterality Date   FOOT ARTHRODESIS Right 09/17/2019   Procedure: FUSION RIGHT LISFRANC JOINT;  Surgeon: Newt Minion, MD;  Location: [facility];  Service: Orthopedics;  Laterality: Right;   FOOT ARTHRODESIS Right 09/2019   HEMORROIDECTOMY     IR FLUORO GUIDE CV LINE RIGHT  02/22/2017   IR US GUIDE VASC ACCESS RIGHT  02/22/2017   TUMOR REMOVAL     From Chest   Social History   Occupational History   Not on file  Tobacco Use   Smoking status: Every Day    Packs/day: 0.50    Years: 15.00    Pack years: 7.50    Types: E-cigarettes, Cigarettes   Smokeless tobacco: Never   Tobacco comments:    has patches when he is ready to quit  Vaping Use   Vaping Use: Some days   Substances: Nicotine  Substance and Sexual Activity   Alcohol use: Yes    Comment: occassional use on weekends   Drug use: Yes    Types: Cocaine, Marijuana    Comment: last use was Friday  03/05/2020   Sexual activity: Yes    Partners: Female    Birth control/protection: Condom    Comment: condoms given

## 2020-07-29 ENCOUNTER — Ambulatory Visit: Payer: Medicaid Other | Admitting: Orthopedic Surgery

## 2020-08-24 ENCOUNTER — Encounter (HOSPITAL_COMMUNITY): Payer: Self-pay

## 2020-08-24 ENCOUNTER — Emergency Department (HOSPITAL_COMMUNITY)
Admission: EM | Admit: 2020-08-24 | Discharge: 2020-08-24 | Disposition: A | Discharge status: Home / Self Care | Payer: Medicaid Other | Attending: Emergency Medicine | Admitting: Emergency Medicine

## 2020-08-24 ENCOUNTER — Other Ambulatory Visit: Payer: Self-pay

## 2020-08-24 DIAGNOSIS — N186 End stage renal disease: Secondary | ICD-10-CM | POA: Diagnosis not present

## 2020-08-24 DIAGNOSIS — I120 Hypertensive chronic kidney disease with stage 5 chronic kidney disease or end stage renal disease: Secondary | ICD-10-CM | POA: Diagnosis not present

## 2020-08-24 DIAGNOSIS — R0789 Other chest pain: Secondary | ICD-10-CM | POA: Insufficient documentation

## 2020-08-24 DIAGNOSIS — Z21 Asymptomatic human immunodeficiency virus [HIV] infection status: Secondary | ICD-10-CM | POA: Diagnosis not present

## 2020-08-24 DIAGNOSIS — R45851 Suicidal ideations: Secondary | ICD-10-CM | POA: Insufficient documentation

## 2020-08-24 DIAGNOSIS — F17210 Nicotine dependence, cigarettes, uncomplicated: Secondary | ICD-10-CM | POA: Diagnosis not present

## 2020-08-24 DIAGNOSIS — R079 Chest pain, unspecified: Secondary | ICD-10-CM | POA: Diagnosis present

## 2020-08-24 DIAGNOSIS — Z79899 Other long term (current) drug therapy: Secondary | ICD-10-CM | POA: Insufficient documentation

## 2020-08-24 DIAGNOSIS — Y900 Blood alcohol level of less than 20 mg/100 ml: Secondary | ICD-10-CM | POA: Diagnosis not present

## 2020-08-24 DIAGNOSIS — Z91040 Latex allergy status: Secondary | ICD-10-CM | POA: Diagnosis not present

## 2020-08-24 DIAGNOSIS — Z20822 Contact with and (suspected) exposure to covid-19: Secondary | ICD-10-CM | POA: Diagnosis not present

## 2020-08-24 DIAGNOSIS — Z992 Dependence on renal dialysis: Secondary | ICD-10-CM | POA: Diagnosis not present

## 2020-08-24 LAB — CBC WITH DIFFERENTIAL/PLATELET
Abs Immature Granulocytes: 0.04 10*3/uL (ref 0.00–0.07)
Basophils Absolute: 0 10*3/uL (ref 0.0–0.1)
Basophils Relative: 0 %
Eosinophils Absolute: 0 10*3/uL (ref 0.0–0.5)
Eosinophils Relative: 0 %
HCT: 46 % (ref 39.0–52.0)
Hemoglobin: 15 g/dL (ref 13.0–17.0)
Immature Granulocytes: 0 %
Lymphocytes Relative: 20 %
Lymphs Abs: 2.3 10*3/uL (ref 0.7–4.0)
MCH: 30.4 pg (ref 26.0–34.0)
MCHC: 32.6 g/dL (ref 30.0–36.0)
MCV: 93.3 fL (ref 80.0–100.0)
Monocytes Absolute: 0.8 10*3/uL (ref 0.1–1.0)
Monocytes Relative: 7 %
Neutro Abs: 8.3 10*3/uL — ABNORMAL HIGH (ref 1.7–7.7)
Neutrophils Relative %: 73 %
Platelets: 217 10*3/uL (ref 150–400)
RBC: 4.93 MIL/uL (ref 4.22–5.81)
RDW: 13 % (ref 11.5–15.5)
WBC: 11.5 10*3/uL — ABNORMAL HIGH (ref 4.0–10.5)
nRBC: 0 % (ref 0.0–0.2)

## 2020-08-24 LAB — COMPREHENSIVE METABOLIC PANEL
ALT: 47 U/L — ABNORMAL HIGH (ref 0–44)
AST: 49 U/L — ABNORMAL HIGH (ref 15–41)
Albumin: 4.8 g/dL (ref 3.5–5.0)
Alkaline Phosphatase: 90 U/L (ref 38–126)
Anion gap: 9 (ref 5–15)
BUN: 15 mg/dL (ref 6–20)
CO2: 26 mmol/L (ref 22–32)
Calcium: 9.8 mg/dL (ref 8.9–10.3)
Chloride: 103 mmol/L (ref 98–111)
Creatinine, Ser: 1.21 mg/dL (ref 0.61–1.24)
GFR, Estimated: >60 mL/min (ref >60)
Glucose, Bld: 88 mg/dL (ref 70–99)
Potassium: 4.3 mmol/L (ref 3.5–5.1)
Sodium: 138 mmol/L (ref 135–145)
Total Bilirubin: 0.7 mg/dL (ref 0.3–1.2)
Total Protein: 8.7 g/dL — ABNORMAL HIGH (ref 6.5–8.1)

## 2020-08-24 LAB — RESP PANEL BY RT-PCR (FLU A&B, COVID) ARPGX2
Influenza A by PCR: NEGATIVE
Influenza B by PCR: NEGATIVE
SARS Coronavirus 2 by RT PCR: NEGATIVE

## 2020-08-24 LAB — ETHANOL: Alcohol, Ethyl (B): <10 mg/dL (ref <10)

## 2020-08-24 LAB — TROPONIN I (HIGH SENSITIVITY): Troponin I (High Sensitivity): 5 ng/L (ref <18)

## 2020-08-24 LAB — ACETAMINOPHEN LEVEL: Acetaminophen (Tylenol), Serum: <10 ug/mL — ABNORMAL LOW (ref 10–30)

## 2020-08-24 NOTE — ED Triage Notes (Signed)
Pt BIB [facility]. Pt states that he wants to kill himself today. He states that [place]. Pt took a molly on Saturday and his hallucinations got worse.

## 2020-08-24 NOTE — ED Provider Notes (Signed)
[facility] DEPT Provider Note   CSN: [id number] Arrival date & time: 08/24/20  0150     History Chief Complaint  Patient presents with   Suicidal   Chest Pain    Dylan [name] is a 55 y.o. male presenting for suicidal thoughts and chest pain.  Patient states he has had chest pain "since he woke up in the basement."  He states he took a bunch of pills, woke up in somebody's basement not his own.  He does not know how he got there.  He does not know what day it was that he woke up.  Pain is been constant since.  He denies associated shortness of breath, cough, nausea, vomiting, diaphoresis.  No history of similar.  Additionally, patient reports he is suicidal.  He states "I just do not want to be here."  He is thought about shooting himself in the head or jumping off the parking deck.  He does not take anything for mental health.  Does not have a therapist or counselor.  Additional history obtained per chart review.  Per chart review, patient with a history of anxiety, bipolar, GERD, HIV, hypertension, hyperlipidemia, prediabetes, schizophrenia  HPI     Past Medical History:  Diagnosis Date   Anxiety    Arthritis    Bipolar 1 disorder ([place])    Colon polyps    Depression    GERD (gastroesophageal reflux disease)    Hepatitis B    Human immunodeficiency virus (HIV) ([place])    Hyperlipidemia    Hypertension    Neuromuscular disorder ([place])    neuropathy   Neuropathy    Pre-diabetes    Schizophrenia ([place])     Patient Active Problem List   Diagnosis Date Noted   Bipolar affective disorder, mixed, severe, with psychotic behavior ([place]) 04/28/2020   Fatty liver 03/09/2020   Schizophrenia ([place]) 03/08/2020   Generalized anxiety disorder 02/03/2020   Insomnia due to other mental disorder 02/03/2020   Lisfranc dislocation, right, initial encounter    Prediabetes 07/22/2019   Tobacco abuse 07/10/2018   ESRD needing dialysis ([place]) 03/03/2018    Schizoaffective disorder, bipolar type ([place])    Acute renal failure (ARF) ([place])    Suicidal ideations    Chronic 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 coma ([place])    AKI (acute kidney injury) ([place]) 02/19/2017   Rhabdomyolysis 02/19/2017   Suicidal overdose ([place]) 02/19/2017   Acute hepatitis 02/19/2017   Elevated LFTs    Schizoaffective disorder, depressive type ([place]) 09/05/2011   Degenerative disc disease 08/21/2011   HIV disease ([place]) 08/17/2011    Past Surgical History:  Procedure Laterality Date   FOOT ARTHRODESIS Right 09/17/2019   Procedure: FUSION RIGHT LISFRANC JOINT;  Surgeon: Newt Minion, MD;  Location: [facility];  Service: Orthopedics;  Laterality: Right;   FOOT ARTHRODESIS Right 09/2019   HEMORROIDECTOMY     IR FLUORO GUIDE CV LINE RIGHT  02/22/2017   IR US GUIDE VASC ACCESS RIGHT  02/22/2017   TUMOR REMOVAL     From Chest       Family History  Problem Relation Age of Onset   CAD Mother    Diabetes Father    Colon cancer Maternal Aunt 60   Diabetes Maternal Aunt    Heart disease Maternal Uncle    Esophageal cancer Neg Hx    Rectal cancer Neg Hx    Stomach cancer Neg Hx     Social History  Tobacco Use   Smoking status: Every Day    Packs/day: 0.50    Years: 15.00    Pack years: 7.50    Types: E-cigarettes, Cigarettes   Smokeless tobacco: Never   Tobacco comments:    has patches when he is ready to quit  Vaping Use   Vaping Use: Some days   Substances: Nicotine  Substance Use Topics   Alcohol use: Yes    Comment: occassional use on weekends   Drug use: Yes    Types: Cocaine, Marijuana    Comment: last use was Friday 03/05/2020    Home Medications Prior to Admission medications   Medication Sig Start Date End Date Taking? Authorizing Provider  bictegravir-emtricitabine-tenofovir AF (BIKTARVY) 50-200-25 MG TABS tablet Take 1 tablet by mouth daily. 04/05/20  Yes Carlyle Basques, MD  DULoxetine (CYMBALTA) 60 MG capsule Take 1 capsule (60 mg  total) by mouth daily. For neuropathy 06/21/20  Yes Charlott Rakes, MD  gabapentin (NEURONTIN) 300 MG capsule Take orally 2 caps ('600mg'$ ) in the morning and 3 caps ('900mg'$ ) in the evening 06/22/20  Yes Charlott Rakes, MD  hydrOXYzine (ATARAX/VISTARIL) 25 MG tablet Take 1 tablet (25 mg total) by mouth 3 (three) times daily as needed for anxiety. 03/17/20  Yes Nwoko, Terese Door, PA  meloxicam (MOBIC) 7.5 MG tablet Take 1 tablet (7.5 mg total) by mouth daily. 07/28/20  Yes Persons, Bevely Palmer, PA  QUEtiapine (SEROQUEL) 200 MG tablet Take 1 tablet (200 mg total) by mouth at bedtime. 04/29/20  Yes Salley Scarlet, MD  rosuvastatin (CRESTOR) 20 MG tablet Take 1 tablet (20 mg total) by mouth daily. 04/05/20  Yes Carlyle Basques, MD  sildenafil (VIAGRA) 50 MG tablet Take 1 tablet (50 mg total) by mouth daily as needed for erectile dysfunction. At least 24 hours between doses 07/07/20  Yes Newlin, Charlane Ferretti, MD  traZODone (DESYREL) 150 MG tablet Take 1 tablet (150 mg total) by mouth at bedtime. 03/17/20  Yes Nwoko, Uchenna E, PA    Allergies    Penicillin g, Penicillins, Latex, and Tape  Review of Systems   Review of Systems  Cardiovascular:  Positive for chest pain.  Psychiatric/Behavioral:  Positive for suicidal ideas.   All other systems reviewed and are negative.  Physical Exam Updated Vital Signs There were no vitals taken for this visit.  Physical Exam Vitals and nursing note reviewed.  Constitutional:      General: He is not in acute distress.    Appearance: Normal appearance.     Comments: nontoxic  HENT:     Head: Normocephalic and atraumatic.  Eyes:     Conjunctiva/sclera: Conjunctivae normal.     Pupils: Pupils are equal, round, and reactive to light.  Cardiovascular:     Rate and Rhythm: Normal rate and regular rhythm.     Pulses: Normal pulses.  Pulmonary:     Effort: Pulmonary effort is normal. No respiratory distress.     Breath sounds: Normal breath sounds. No wheezing.      Comments: Speaking in full sentences.  Clear lung sounds in all fields. Chest:     Chest wall: No tenderness.  Abdominal:     General: There is no distension.     Palpations: Abdomen is soft. There is no mass.     Tenderness: There is no abdominal tenderness. There is no guarding or rebound.  Musculoskeletal:        General: Normal range of motion.     Cervical back: Normal range of  motion and neck supple.  Skin:    General: Skin is warm and dry.     Capillary Refill: Capillary refill takes less than 2 seconds.  Neurological:     Mental Status: He is alert and oriented to person, place, and time.  Psychiatric:        Attention and Perception: He does not perceive auditory or visual hallucinations.        Mood and Affect: Mood normal. Affect is flat.        Speech: He is noncommunicative.        Behavior: Behavior normal.        Thought Content: Thought content includes suicidal ideation. Thought content does not include homicidal ideation. Thought content includes suicidal plan. Thought content does not include homicidal plan.     Comments: Suicidal thoughts with a plan.    ED Results / Procedures / Treatments   Labs (all labs ordered are listed, but only abnormal results are displayed) Labs Reviewed  CBC WITH DIFFERENTIAL/PLATELET - Abnormal; Notable for the following components:      Result Value   WBC 11.5 (*)    Neutro Abs 8.3 (*)    All other components within normal limits  COMPREHENSIVE METABOLIC PANEL  URINALYSIS, ROUTINE W REFLEX MICROSCOPIC  ETHANOL  ACETAMINOPHEN LEVEL  RAPID URINE DRUG SCREEN, HOSP PERFORMED  TROPONIN I (HIGH SENSITIVITY)    EKG None  Radiology No results found.  Procedures Procedures   Medications Ordered in ED Medications - No data to display  ED Course  I have reviewed the triage vital signs and the nursing notes.  Pertinent labs & imaging results that were available during my care of the patient were reviewed by me and  considered in my medical decision making (see chart for details).    MDM Rules/Calculators/A&P                           Patient presenting for evaluation of suicidal thoughts.  He also also reporting chest pain which sounds very atypical.  However considering his new chest pain, will obtain cardiac work-up for medical clearance prior to behavioral health evaluation.   Labs interpreted by me, overall reassuring.  Troponin negative.  EKG is nonischemic.  At this time, patient is medically cleared for psychiatric evaluation.   The patient has been placed in psychiatric observation due to the need to provide a safe environment for the patient while obtaining psychiatric consultation and evaluation, as well as ongoing medical and medication management to treat the patient's condition.  The patient has not been placed under full IVC at this time.   Final Clinical Impression(s) / ED Diagnoses Final diagnoses:  None    Rx / DC Orders ED Discharge Orders     None        Franchot Heidelberg, PA-C 08/24/20 0442    Mesner, Corene Cornea, MD 08/24/20 0505
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
## 2020-08-24 NOTE — Discharge Instructions (Addendum)
For your behavioral health needs, you are advised to follow up with the Partial Hospitalization Program (PHP) at the [facility].  This program meets Monday - Friday from 9:00 am - 1:00 pm.  Due to Covid-19 this program is currently at capacity.  If you have any questions, contact Loistine Chance, [facility] at the phone number indicated below:       [facility]. [address] [place], [place] [address]      Contact person: Loistine Chance, [facility]      [phone]

## 2020-09-21 ENCOUNTER — Encounter (INDEPENDENT_AMBULATORY_CARE_PROVIDER_SITE_OTHER): Payer: Self-pay

## 2020-10-25 ENCOUNTER — Other Ambulatory Visit (HOSPITAL_COMMUNITY)
Admission: EM | Admit: 2020-10-25 | Discharge: 2020-10-31 | Disposition: A | Discharge status: Home / Self Care | Payer: Medicaid Other | Attending: Psychiatry | Admitting: Psychiatry

## 2020-10-25 DIAGNOSIS — F99 Mental disorder, not otherwise specified: Secondary | ICD-10-CM

## 2020-10-25 DIAGNOSIS — F17210 Nicotine dependence, cigarettes, uncomplicated: Secondary | ICD-10-CM | POA: Insufficient documentation

## 2020-10-25 DIAGNOSIS — F251 Schizoaffective disorder, depressive type: Secondary | ICD-10-CM | POA: Insufficient documentation

## 2020-10-25 DIAGNOSIS — Z20822 Contact with and (suspected) exposure to covid-19: Secondary | ICD-10-CM | POA: Insufficient documentation

## 2020-10-25 DIAGNOSIS — F250 Schizoaffective disorder, bipolar type: Secondary | ICD-10-CM | POA: Diagnosis present

## 2020-10-25 DIAGNOSIS — F5105 Insomnia due to other mental disorder: Secondary | ICD-10-CM | POA: Insufficient documentation

## 2020-10-25 DIAGNOSIS — F1994 Other psychoactive substance use, unspecified with psychoactive substance-induced mood disorder: Secondary | ICD-10-CM | POA: Insufficient documentation

## 2020-10-25 DIAGNOSIS — F411 Generalized anxiety disorder: Secondary | ICD-10-CM | POA: Insufficient documentation

## 2020-10-25 DIAGNOSIS — G629 Polyneuropathy, unspecified: Secondary | ICD-10-CM

## 2020-10-25 DIAGNOSIS — R45851 Suicidal ideations: Secondary | ICD-10-CM | POA: Insufficient documentation

## 2020-10-25 NOTE — BH Assessment (Signed)
Comprehensive Clinical Assessment (CCA) Note  10/26/2020 Dylan [name] [id number]  Discharge Disposition: Lindon Romp, NP, reviewed pt's chart and information and met with pt face-to-face and determined pt meets criteria for continuous assessment. Pt will be assessed overnight and re-assessed by psychiatry in the morning.  The patient demonstrates the following risk factors for suicide: Chronic risk factors for suicide include: psychiatric disorder of Schizoaffective disorder, depressive type and previous suicide attempts the most recent today . Acute risk factors for suicide include: loss (financial, interpersonal, professional). Protective factors for this patient include: positive social support. Considering these factors, the overall suicide risk at this point appears to be high. Patient is not appropriate for outpatient follow up.  Therefore, a 1:1 sitter is recommended for suicide precautions.  [place] ED from 10/25/2020 in [facility] ED from 08/24/2020 in [place] DEPT Admission (Discharged) from 04/28/2020 in [place] CATEGORY High Risk High Risk Moderate Risk     Chief Complaint:  Chief Complaint  Patient presents with   Suicidal   Visit Diagnosis: F25.1, Schizoaffective disorder, depressive type  CCA Screening, Triage and Referral (STR) Dylan [name] is a 55 year old patient who was brought to the [facility]) by [employer] due to pt going to the top of a parking deck with the intention to jump. Pt states, "I'm real paranoid, I haven't taken my meds in about a month. My daughter passed away 2 years ago and my mom passed away 1 year ago. My neighborhood isn't safe. I'm in mental and spiritual pain." Pt states he had been taking Seroquel, Trazadone, and Ativan; he states he was in [place] for about 6 months and that, upon returning home about 6 weeks ago, has not had an  opportunity to arrange to be seen at the [facility] like he had been prior to moving to [place]. Pt acknowledges he's experiencing SI and states he has a hx of SI; he states he's attempted to kill himself in the past and that he currently has a plan to jump off of the parking garage (the police brought him to the [facility] from there). Pt shares he's been hospitalized in the past, though he can't remember the last time. Pt denies HI, NSSIB, access to guns, and engagement with the legal system. Pt states he experiences AVTH. He states he typically drinks 2-3 12-ounce beers/day and that he was clean from cocaine for 6 months until 4 days ago when he relapsed on $10 worth.  Pt is oriented x5. His recent/remote memory is intact. Pt was cooperative throughout the assessment process. Pt's insight, judgement, and impulse control is impaired at this time.  Patient Reported Information How did you hear about [place]? Legal System  What Is the Reason for Your Visit/Call Today? Pt states, "I'm real paranoid, I ahvne't taken my meds in abouta month. My daughter passed away 2 years ago and my mom passed away 1 year ago. My neighborhood isn't safe. I'm in mental and spiritual pain." Pt states he had been taking Seroquel, Trazadone, and Ativan; he states he was in [place] for about 6 months and that, upon returning home about 6 weeks ago, has not had an opportunity to arrange to be seen at the [facility] like he had been prior to moving to [place]. Pt acknowledges he's experiencing SI and states he has a hx of Si; he states he's attempted to kill himself in the past and that he currently  has a plan to jump off of the parking garage (the police brought him to the [facility] from there). Pt shares he's been hospitalized in the past, though he can't remember the last time. Pt denies HI, NSSIB, access to guns, and engagement with the legal system. Pt states he experiences AVTH. He states he typically drinks 2-3 12-ounce beers/day and that he was clean from  cocaine for 6 months until 4 days ago when he relapsed on $10 worth.  How Long Has This Been Causing You Problems? 1 wk - 1 month  What Do You Feel Would Help You the Most Today? Treatment for Depression or other mood problem; Medication(s)   Have You Recently Had Any Thoughts About Hurting Yourself? Yes  Are You Planning to Commit Suicide/Harm Yourself At This time? Yes   Have you Recently Had Thoughts About Hurting Someone Dylan [name]? No  Are You Planning to Harm Someone at This Time? No  Explanation: No data recorded  Have You Used Any Alcohol or Drugs in the Past 24 Hours? Yes  How Long Ago Did You Use Drugs or Alcohol? 0000 (states that he drank last night)  What Did You Use and How Much? 1 12-ounce beer   Do You Currently Have a Therapist/Psychiatrist? No  Name of Therapist/Psychiatrist: No data recorded  Have You Been Recently Discharged From Any Office Practice or Programs? No  Explanation of Discharge From Practice/Program: No data recorded    CCA Screening Triage Referral Assessment Type of Contact: Face-to-Face  Telemedicine Service Delivery:   Is this Initial or Reassessment? No data recorded Date Telepsych consult ordered in [facility]:  No data recorded Time Telepsych consult ordered in [facility]:  No data recorded Location of Assessment: [facility] Assessment Services  Provider Location: [facility] Assessment Services   Collateral Involvement: None at this time   Does Patient Have a Whitney? No data recorded Name and Contact of Legal Guardian: No data recorded If Minor and Not Living with Parent(s), Who has Custody? N/A  Is CPS involved or ever been involved? Never  Is APS involved or ever been involved? Never   Patient Determined To Be At Risk for Harm To Self or Others Based on Review of Patient Reported Information or Presenting Complaint? Yes, for Self-Harm  Method: No data recorded Availability of Means: No data recorded Intent: No data  recorded Notification Required: No data recorded Additional Information for Danger to Others Potential: No data recorded Additional Comments for Danger to Others Potential: No data recorded Are There Guns or Other Weapons in Your Home? No data recorded Types of Guns/Weapons: No data recorded Are These Weapons Safely Secured?                            No data recorded Who Could Verify You Are Able To Have These Secured: No data recorded Do You Have any Outstanding Charges, Pending Court Dates, Parole/Probation? No data recorded Contacted To Inform of Risk of Harm To Self or Others: Law Enforcement ([employer] is aware)    Does Patient Present under Involuntary Commitment? No  IVC Papers Initial File Date: No data recorded  [place] of Residence: [place]   Patient Currently Receiving the Following Services: Not Receiving Services   Determination of Need: Urgent (48 hours)   Options For Referral: [facility]; Medication Management; Outpatient Therapy     CCA Biopsychosocial Patient Reported Schizophrenia/Schizoaffective Diagnosis in Past: Yes   Strengths: Pt  is able to identify his thoughts, feelings, and concerns. He would like to be put back on medication.   Mental Health Symptoms Depression:   Worthlessness; Hopelessness; Sleep (too much or little); Irritability; Difficulty Concentrating; Tearfulness   Duration of Depressive symptoms:  Duration of Depressive Symptoms: Greater than two weeks   Mania:   N/A   Anxiety:    Tension; Worrying   Psychosis:   Hallucinations   Duration of Psychotic symptoms:  Duration of Psychotic Symptoms: Greater than six months   Trauma:   Difficulty staying/falling asleep; Avoids reminders of event   Obsessions:   None   Compulsions:   None   Inattention:   Forgetful; Loses things; Disorganized   Hyperactivity/Impulsivity:   None   Oppositional/Defiant Behaviors:   Easily annoyed; Argumentative   Emotional  Irregularity:   Potentially harmful impulsivity; Recurrent suicidal behaviors/gestures/threats   Other Mood/Personality Symptoms:   None noted    Mental Status Exam Appearance and self-care  Stature:   Tall   Weight:   Average weight   Clothing:   Casual   Grooming:   Normal   Cosmetic use:   None   Posture/gait:   Normal   Motor activity:   Restless   Sensorium  Attention:   Normal   Concentration:   Normal   Orientation:   X5   Recall/memory:   Normal   Affect and Mood  Affect:   Depressed   Mood:   Depressed   Relating  Eye contact:   Normal   Facial expression:   Depressed   Attitude toward examiner:   Cooperative   Thought and Language  Speech flow:  Clear and Coherent   Thought content:   Appropriate to Mood and Circumstances   Preoccupation:   Suicide   Hallucinations:   Auditory; Tactile; Visual   Organization:  No data recorded  [employer] of Knowledge:   Average   Intelligence:   Average   Abstraction:   Normal   Judgement:   Impaired   Reality Testing:   Realistic   Insight:   Fair   Decision Making:   Impulsive   Social Functioning  Social Maturity:   Impulsive   Social Judgement:   Naive   Stress  Stressors:   Grief/losses   Coping Ability:   Programme researcher, broadcasting/film/video Deficits:   Decision making   Supports:   Friends/Service system     Religion: Religion/Spirituality Are You A Religious Person?: No How Might This Affect Treatment?: Not assessed  Leisure/Recreation: Leisure / Recreation Do You Have Hobbies?: Yes Leisure and Hobbies: "Streaming shows, catching up on those, reading is something have stopped doing but I like reading."  Exercise/Diet: Exercise/Diet Do You Exercise?: No Have You Gained or Lost A Significant Amount of Weight in the Past Six Months?: No Do You Follow a Special Diet?: No Do You Have Any Trouble Sleeping?: Yes Explanation of Sleeping  Difficulties: Pt shares he has been having difficulties sleeping; he had been prescribed Trazodone, which he states had helped.   CCA Employment/Education Employment/Work Situation: Employment / Work Technical sales engineer: On disability Work Stressors: N/A Why is Patient on Disability: Physical concerns How Long has Patient Been on Disability: Not assessed Patient's Job has Been Impacted by Current Illness: No Has Patient ever Been in the [employer]?: No  Education: Education Is Patient Currently Attending School?: No Last Grade Completed: 12 Did You Attend College?: Yes What Type of College Degree Do you Have?: [employer]  of [place], Nursing. Did You Have An Individualized Education Program (IIEP): No Did You Have Any Difficulty At School?: No Patient's Education Has Been Impacted by Current Illness: No   CCA Family/Childhood History Family and Relationship History:    Childhood History:     Child/Adolescent Assessment:     CCA Substance Use Alcohol/Drug Use: Alcohol / Drug Use Pain Medications: See MAR Prescriptions: See MAR Over the Counter: See MAR History of alcohol / drug use?: Yes Longest period of sobriety (when/how long): 2 years Negative Consequences of Use: Financial, Legal Withdrawal Symptoms: None Substance #1 Name of Substance 1: Cocaine 1 - Age of First Use: Unknown 1 - Amount (size/oz): $10 1 - Frequency: Pt recently relapsed once after being clean for 6 months 1 - Duration: 1x 1 - Last Use / Amount: 4 days ago 1 - Method of Aquiring: Illegal purchase 1- Route of Use: Not assessed Substance #2 Name of Substance 2: EtOH 2 - Age of First Use: Not assessed 2 - Amount (size/oz): 2-3 12-ounce beers 2 - Frequency: Daily 2 - Duration: Ongoing 2 - Last Use / Amount: Today 2 - Method of Aquiring: Purchase 2 - Route of Substance Use: Oral                     ASAM's:  Six Dimensions of Multidimensional Assessment  Dimension 1:  Acute  Intoxication and/or Withdrawal Potential:   Dimension 1:  Description of individual's past and current experiences of substance use and withdrawal: Pt denies  Dimension 2:  Biomedical Conditions and Complications:   Dimension 2:  Description of patient's biomedical conditions and  complications: Pt denies  Dimension 3:  Emotional, Behavioral, or Cognitive Conditions and Complications:  Dimension 3:  Description of emotional, behavioral, or cognitive conditions and complications: Pt is experiencing SI and AVTH  Dimension 4:  Readiness to Change:  Dimension 4:  Description of Readiness to Change criteria: Pt does not believe he has a problem with EtOH, stating, "i drink it like Pepsi - it doesn't get me drunk, I just like the taste."  Dimension 5:  Relapse, Continued use, or Continued Problem Potential:  Dimension 5:  Relapse, continued use, or continued problem potential critiera description: Pt recently relapsed on cocaine after 6 months of sobriety  Dimension 6:  Recovery/Living Environment:  Dimension 6:  Recovery/Iiving environment criteria description: Pt has a "friend" living with him who sells cocaine  ASAM Severity Score: ASAM's Severity Rating Score: 8  ASAM Recommended Level of Treatment: ASAM Recommended Level of Treatment: Level I Outpatient Treatment   Substance use Disorder (SUD) Substance Use Disorder (SUD)  Checklist Symptoms of Substance Use: Evidence of tolerance, Persistent desire or unsuccessful efforts to cut down or control use, Presence of craving or strong urge to use, Substance(s) often taken in larger amounts or over longer times than was intended  Recommendations for Services/Supports/Treatments: Recommendations for Services/Supports/Treatments Recommendations For Services/Supports/Treatments: Other (Comment), Individual Therapy, Medication Management ([place] Continuous Assessment)  Discharge Disposition: Discharge Disposition Medical Exam completed: Yes Disposition of  Patient: Admit Mode of transportation if patient is discharged/movement?: N/A  Lindon Romp, NP, reviewed pt's chart and information and met with pt face-to-face and determined pt meets criteria for continuous assessment. Pt will be assessed overnight and re-assessed by psychiatry in the morning.  DSM5 Diagnoses: Patient Active Problem List   Diagnosis Date Noted   Bipolar affective disorder, mixed, severe, with psychotic behavior ([place]) 04/28/2020   Fatty liver 03/09/2020   Schizophrenia ([place])  03/08/2020   Generalized anxiety disorder 02/03/2020   Insomnia due to other mental disorder 02/03/2020   Lisfranc dislocation, right, initial encounter    Prediabetes 07/22/2019   Tobacco abuse 07/10/2018   ESRD needing dialysis ([place]) 03/03/2018   Schizoaffective disorder, bipolar type ([place])    Acute renal failure (ARF) ([place])    Suicidal ideations    Chronic 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 coma ([place])    AKI (acute kidney injury) ([place]) 02/19/2017   Rhabdomyolysis 02/19/2017   Suicidal overdose ([place]) 02/19/2017   Acute hepatitis 02/19/2017   Elevated LFTs    Schizoaffective disorder, depressive type ([place]) 09/05/2011   Degenerative disc disease 08/21/2011   HIV disease ([place]) 08/17/2011     Referrals to Alternative Service(s): Referred to Alternative Service(s):   Place:   Date:   Time:    Referred to Alternative Service(s):   Place:   Date:   Time:    Referred to Alternative Service(s):   Place:   Date:   Time:    Referred to Alternative Service(s):   Place:   Date:   Time:     Dannielle Burn, LMFT

## 2020-10-26 ENCOUNTER — Encounter (HOSPITAL_COMMUNITY): Payer: Self-pay | Admitting: Emergency Medicine

## 2020-10-26 DIAGNOSIS — F411 Generalized anxiety disorder: Secondary | ICD-10-CM | POA: Diagnosis not present

## 2020-10-26 DIAGNOSIS — F1994 Other psychoactive substance use, unspecified with psychoactive substance-induced mood disorder: Secondary | ICD-10-CM | POA: Diagnosis not present

## 2020-10-26 DIAGNOSIS — F251 Schizoaffective disorder, depressive type: Secondary | ICD-10-CM | POA: Diagnosis not present

## 2020-10-26 DIAGNOSIS — F5105 Insomnia due to other mental disorder: Secondary | ICD-10-CM | POA: Diagnosis not present

## 2020-10-26 LAB — LIPID PANEL
Cholesterol: 153 mg/dL (ref 0–200)
HDL: 43 mg/dL (ref >40)
LDL Cholesterol: 94 mg/dL (ref 0–99)
Total CHOL/HDL Ratio: 3.6 RATIO
Triglycerides: 80 mg/dL (ref <150)
VLDL: 16 mg/dL (ref 0–40)

## 2020-10-26 LAB — COMPREHENSIVE METABOLIC PANEL
ALT: 38 U/L (ref 0–44)
AST: 34 U/L (ref 15–41)
Albumin: 4.1 g/dL (ref 3.5–5.0)
Alkaline Phosphatase: 91 U/L (ref 38–126)
Anion gap: 10 (ref 5–15)
BUN: 15 mg/dL (ref 6–20)
CO2: 21 mmol/L — ABNORMAL LOW (ref 22–32)
Calcium: 9.7 mg/dL (ref 8.9–10.3)
Chloride: 105 mmol/L (ref 98–111)
Creatinine, Ser: 1.16 mg/dL (ref 0.61–1.24)
GFR, Estimated: >60 mL/min (ref >60)
Glucose, Bld: 151 mg/dL — ABNORMAL HIGH (ref 70–99)
Potassium: 3.8 mmol/L (ref 3.5–5.1)
Sodium: 136 mmol/L (ref 135–145)
Total Bilirubin: 0.7 mg/dL (ref 0.3–1.2)
Total Protein: 6.9 g/dL (ref 6.5–8.1)

## 2020-10-26 LAB — POCT URINE DRUG SCREEN - MANUAL ENTRY (I-SCREEN)
POC Amphetamine UR: NOT DETECTED
POC Buprenorphine (BUP): NOT DETECTED
POC Cocaine UR: POSITIVE — AB
POC Marijuana UR: NOT DETECTED
POC Methadone UR: NOT DETECTED
POC Methamphetamine UR: NOT DETECTED
POC Morphine: NOT DETECTED
POC Oxazepam (BZO): POSITIVE — AB
POC Oxycodone UR: NOT DETECTED
POC Secobarbital (BAR): NOT DETECTED

## 2020-10-26 LAB — CBC WITH DIFFERENTIAL/PLATELET
Abs Immature Granulocytes: 0 10*3/uL (ref 0.00–0.07)
Basophils Absolute: 0 10*3/uL (ref 0.0–0.1)
Basophils Relative: 0 %
Eosinophils Absolute: 0.1 10*3/uL (ref 0.0–0.5)
Eosinophils Relative: 1 %
HCT: 41.4 % (ref 39.0–52.0)
Hemoglobin: 14.1 g/dL (ref 13.0–17.0)
Immature Granulocytes: 0 %
Lymphocytes Relative: 36 %
Lymphs Abs: 2.8 10*3/uL (ref 0.7–4.0)
MCH: 30.8 pg (ref 26.0–34.0)
MCHC: 34.1 g/dL (ref 30.0–36.0)
MCV: 90.4 fL (ref 80.0–100.0)
Monocytes Absolute: 0.4 10*3/uL (ref 0.1–1.0)
Monocytes Relative: 5 %
Neutro Abs: 4.4 10*3/uL (ref 1.7–7.7)
Neutrophils Relative %: 58 %
Platelets: 218 10*3/uL (ref 150–400)
RBC: 4.58 MIL/uL (ref 4.22–5.81)
RDW: 12.6 % (ref 11.5–15.5)
WBC: 7.7 10*3/uL (ref 4.0–10.5)
nRBC: 0 % (ref 0.0–0.2)

## 2020-10-26 LAB — RESP PANEL BY RT-PCR (FLU A&B, COVID) ARPGX2
Influenza A by PCR: NEGATIVE
Influenza B by PCR: NEGATIVE
SARS Coronavirus 2 by RT PCR: NEGATIVE

## 2020-10-26 LAB — HEMOGLOBIN A1C
Hgb A1c MFr Bld: 6 % — ABNORMAL HIGH (ref 4.8–5.6)
Mean Plasma Glucose: 125.5 mg/dL

## 2020-10-26 LAB — TSH: TSH: 0.512 u[IU]/mL (ref 0.350–4.500)

## 2020-10-26 LAB — POC SARS CORONAVIRUS 2 AG -  ED: SARS Coronavirus 2 Ag: NEGATIVE

## 2020-10-26 LAB — ETHANOL: Alcohol, Ethyl (B): <10 mg/dL (ref <10)

## 2020-10-26 MED ORDER — GABAPENTIN 300 MG PO CAPS
600.0000 mg | ORAL_CAPSULE | Freq: Two times a day (BID) | ORAL | Status: DC
  Administered 2020-10-26 – 2020-10-31 (×12): 600 mg via ORAL
  Filled 2020-10-26 (×11): qty 2
  Filled 2020-10-26: qty 6

## 2020-10-26 MED ORDER — DULOXETINE HCL 60 MG PO CPEP: 60.0000 mg | ORAL_CAPSULE | Freq: Every day | ORAL | 3 refills | Status: DC

## 2020-10-26 MED ORDER — QUETIAPINE FUMARATE 200 MG PO TABS: 200.0000 mg | ORAL_TABLET | Freq: Every day | ORAL | 1 refills | Status: DC

## 2020-10-26 MED ORDER — ALUM & MAG HYDROXIDE-SIMETH 200-200-20 MG/5ML PO SUSP
30.0000 mL | ORAL | Status: DC | PRN
Start: 2020-10-26 — End: 2020-10-31

## 2020-10-26 MED ORDER — QUETIAPINE FUMARATE 200 MG PO TABS
200.0000 mg | ORAL_TABLET | Freq: Every day | ORAL | Status: DC
  Administered 2020-10-26 – 2020-10-27 (×3): 200 mg via ORAL
  Filled 2020-10-26 (×3): qty 1

## 2020-10-26 MED ORDER — ACETAMINOPHEN 325 MG PO TABS: 650.0000 mg | ORAL_TABLET | Freq: Four times a day (QID) | ORAL | Status: DC | PRN

## 2020-10-26 MED ORDER — MAGNESIUM HYDROXIDE 400 MG/5ML PO SUSP
30.0000 mL | Freq: Every day | ORAL | Status: DC | PRN
Start: 2020-10-26 — End: 2020-10-31

## 2020-10-26 MED ORDER — MELOXICAM 7.5 MG PO TABS
7.5000 mg | ORAL_TABLET | Freq: Every day | ORAL | Status: DC
  Administered 2020-10-26 – 2020-10-31 (×6): 7.5 mg via ORAL
  Filled 2020-10-26 (×6): qty 1

## 2020-10-26 MED ORDER — BICTEGRAVIR-EMTRICITAB-TENOFOV 50-200-25 MG PO TABS
1.0000 | ORAL_TABLET | Freq: Every day | ORAL | Status: DC
  Administered 2020-10-26 – 2020-10-31 (×6): 1 via ORAL
  Filled 2020-10-26 (×6): qty 1

## 2020-10-26 MED ORDER — ALUM & MAG HYDROXIDE-SIMETH 200-200-20 MG/5ML PO SUSP: 30.0000 mL | ORAL | Status: DC | PRN

## 2020-10-26 MED ORDER — HYDROXYZINE HCL 25 MG PO TABS: 25.0000 mg | ORAL_TABLET | Freq: Three times a day (TID) | ORAL | Status: DC | PRN

## 2020-10-26 MED ORDER — MAGNESIUM HYDROXIDE 400 MG/5ML PO SUSP: 30.0000 mL | Freq: Every day | ORAL | Status: DC | PRN

## 2020-10-26 MED ORDER — TRAZODONE HCL 150 MG PO TABS: 150.0000 mg | ORAL_TABLET | Freq: Every day | ORAL | 0 refills | Status: DC

## 2020-10-26 MED ORDER — HYDROXYZINE HCL 25 MG PO TABS: 25.0000 mg | ORAL_TABLET | Freq: Three times a day (TID) | ORAL | 0 refills | Status: DC | PRN

## 2020-10-26 MED ORDER — TRAZODONE HCL 150 MG PO TABS
150.0000 mg | ORAL_TABLET | Freq: Every day | ORAL | Status: DC
  Administered 2020-10-26 – 2020-10-30 (×6): 150 mg via ORAL
  Filled 2020-10-26 (×6): qty 1

## 2020-10-26 MED ORDER — ROSUVASTATIN CALCIUM 20 MG PO TABS
20.0000 mg | ORAL_TABLET | Freq: Every day | ORAL | Status: DC
  Administered 2020-10-26 – 2020-10-31 (×6): 20 mg via ORAL
  Filled 2020-10-26 (×6): qty 1

## 2020-10-26 MED ORDER — DULOXETINE HCL 60 MG PO CPEP
60.0000 mg | ORAL_CAPSULE | Freq: Every day | ORAL | Status: DC
  Administered 2020-10-26 – 2020-10-31 (×6): 60 mg via ORAL
  Filled 2020-10-26 (×6): qty 1

## 2020-10-26 NOTE — ED Provider Notes (Signed)
Behavioral Health Admission [facility])  Date: 10/26/20 Patient Name: Dylan [name] MRN: [id number] Chief Complaint:  Chief Complaint  Patient presents with   Suicidal      Diagnoses:  Final diagnoses:  Schizoaffective disorder, depressive type ([place])  Generalized anxiety disorder  Insomnia due to other mental disorder    HPI: Dylan [name] 55 y.o. male admitted to [facility] for safety and stabilization after presenting with complaints of suicidal ideation with a plan to jump off of a parking day.  Dylan [name], 55 y.o., male patient seen face to face by this provider, consulted with Dr. Ernie Hew and chart reviewed on 10/26/20.  On evaluation Dylan [name] reports hat he is not feeling well and reports that he came to urgent care because he was having thoughts of jumping off a parking deck that was stopped when police showed.  Patient denies any outpatient psychiatric services at this time and reports he has not taken any psychotropic medication for 5 days prior to coming to urgent care. Patient continues to endorse suicidal ideation with intent and plan.  Patient would not elaborate on questions or answer questions dealing with auditory/visual hallucinations or paranoia.  Patient has not provided a urine sample for urine drug screen or urinalysis. During evaluation Dylan [name] is laying in bed in and out of sleep in no acute distress.  He is oriented x 4 and calm.  His mood is irritable and dysphoric with congruent affect.  He has normal speech, and behavior.  Objectively there is no evidence of psychosis/mania or delusional thinking; but patient states he is having auditory/visual hallucinations but will not elaborate.  He is able to converse coherently, goal directed thoughts, no distractibility, or pre-occupation when awake    PHQ 2-9:  [place] ED from 10/25/2020 in [facility] Office Visit from 03/17/2020 in [place] ED from 11/09/2019 in [facility]  Thoughts that you would be better off dead, or of hurting yourself in some way More than half the days Not at all More than half the days  PHQ-9 Total Score '15 9 19       '$ Flowsheet Row ED from 10/25/2020 in [facility] ED from 08/24/2020 in [place] DEPT Admission (Discharged) from 04/28/2020 in [facility] High Risk High Risk Moderate Risk        Total Time spent with patient: 30 minutes  Musculoskeletal  Strength & Muscle Tone: within normal limits Gait & Station: normal Patient leans: N/A  Psychiatric Specialty Exam  Presentation General Appearance: Appropriate for Environment  Eye Contact:None  Speech:Clear and Coherent  Speech Volume:Normal  Handedness:Right   Mood and Affect  Mood:Irritable  Affect:Congruent   Thought Process  Thought Processes:Coherent  Descriptions of Associations:Intact  Orientation:Full (Time, Place and Person)  Thought Content:WDL  Diagnosis of Schizophrenia or Schizoaffective disorder in past: Yes  Duration of Psychotic Symptoms: Greater than six months  Hallucinations:Hallucinations: None Description of Auditory Hallucinations: does not want to discuss Description of Visual Hallucinations: shadow figures  Ideas of Reference:None  Suicidal Thoughts:Suicidal Thoughts: Yes, Active SI Active Intent and/or Plan: With Intent; With Plan (Patient states that he was going to jump off parking deck but police came)  Homicidal Thoughts:Homicidal Thoughts: No   Sensorium  Memory:Immediate Good; Recent Good  Judgment:Intact  Insight:Fair   Executive Functions  Concentration:Fair  Attention Span:Fair  Recall:Fair; Good  Fund of Knowledge:Fair; Good  Language:Fair; Good   Psychomotor Activity  Psychomotor Activity:Psychomotor Activity:  Normal   Assets  Assets:Communication Skills; Desire for Improvement   Sleep  Sleep:Sleep: Good   Nutritional Assessment (For OBS and FBC admissions only) Has the patient had a weight loss or gain of 10 pounds or more in the last 3 months?: No Has the patient had a decrease in food intake/or appetite?: No Does the patient have dental problems?: No Does the patient have eating habits or behaviors that may be indicators of an eating disorder including binging or inducing vomiting?: No Has the patient recently lost weight without trying?: 0 Has the patient been eating poorly because of a decreased appetite?: 0 Malnutrition Screening Tool Score: 0    Physical Exam Constitutional:      General: He is not in acute distress.    Appearance: He is not ill-appearing, toxic-appearing or diaphoretic.  HENT:     Head: Normocephalic.     Right Ear: External ear normal.     Left Ear: External ear normal.  Eyes:     Pupils: Pupils are equal, round, and reactive to light.  Cardiovascular:     Rate and Rhythm: Normal rate.  Pulmonary:     Effort: Pulmonary effort is normal. No respiratory distress.  Musculoskeletal:        General: Normal range of motion.  Skin:    General: Skin is warm and dry.  Neurological:     Mental Status: He is alert and oriented to person, place, and time.  Psychiatric:        Mood and Affect: Mood is anxious and depressed.        Speech: Speech normal.        Behavior: Behavior is cooperative.        Thought Content: Thought content is paranoid. Thought content includes suicidal ideation. Thought content does not include homicidal ideation. Thought content includes suicidal plan.   Review of Systems  Constitutional:  Negative for chills, diaphoresis, fever, malaise/fatigue and weight loss.  HENT:  Negative for congestion.   Respiratory:  Negative for cough and shortness of breath.   Cardiovascular:  Negative for chest pain and palpitations.   Gastrointestinal:  Negative for diarrhea, nausea and vomiting.  Neurological:  Negative for dizziness and seizures.  Psychiatric/Behavioral:  Positive for depression and suicidal ideas. Negative for hallucinations, memory loss and substance abuse. The patient has insomnia. The patient is not nervous/anxious.   All other systems reviewed and are negative.  Blood pressure 119/86, pulse 64, temperature 98.4 F (36.9 C), temperature source Oral, resp. rate 18, SpO2 98 %. There is no height or weight on file to calculate BMI.  Past Psychiatric History: See Problem list   Is the patient at risk to self? Yes  Has the patient been a risk to self in the past 6 months? Yes .    Has the patient been a risk to self within the distant past? Yes   Is the patient a risk to others? No   Has the patient been a risk to others in the past 6 months? No   Has the patient been a risk to others within the distant past? No   Past Medical History:  Past Medical History:  Diagnosis Date   Anxiety    Arthritis    Bipolar 1 disorder ([place])    Colon polyps    Depression    GERD (gastroesophageal reflux disease)    Hepatitis B  Human immunodeficiency virus (HIV) ([place])    Hyperlipidemia    Hypertension    Neuromuscular disorder ([place])    neuropathy   Neuropathy    Pre-diabetes    Schizophrenia [facility])     Past Surgical History:  Procedure Laterality Date   FOOT ARTHRODESIS Right 09/17/2019   Procedure: FUSION RIGHT LISFRANC JOINT;  Surgeon: Newt Minion, MD;  Location: [facility];  Service: Orthopedics;  Laterality: Right;   FOOT ARTHRODESIS Right 09/2019   HEMORROIDECTOMY     IR FLUORO GUIDE CV LINE RIGHT  02/22/2017   IR US GUIDE VASC ACCESS RIGHT  02/22/2017   TUMOR REMOVAL     From Chest    Family History:  Family History  Problem Relation Age of Onset   CAD Mother    Diabetes Father    Colon cancer Maternal Aunt 60   Diabetes Maternal Aunt    Heart disease Maternal Uncle    Esophageal cancer  Neg Hx    Rectal cancer Neg Hx    Stomach cancer Neg Hx     Social History:  Social History   Socioeconomic History   Marital status: Married    Spouse name: Not on file   Number of children: 1   Years of education: Not on file   Highest education level: Not on file  Occupational History   Not on file  Tobacco Use   Smoking status: Every Day    Packs/day: 0.50    Years: 15.00    Pack years: 7.50    Types: E-cigarettes, Cigarettes   Smokeless tobacco: Never   Tobacco comments:    has patches when he is ready to quit  Vaping Use   Vaping Use: Some days   Substances: Nicotine  Substance and Sexual Activity   Alcohol use: Yes    Comment: occassional use on weekends   Drug use: Yes    Types: Cocaine, Marijuana    Comment: last use was Friday 03/05/2020   Sexual activity: Yes    Partners: Female    Birth control/protection: Condom    Comment: condoms given  Other Topics Concern   Not on file  Social History Narrative   Not on file   Social Determinants of Health   Financial Resource Strain: Not on file  Food Insecurity: Not on file  Transportation Needs: Not on file  Physical Activity: Not on file  Stress: Not on file  Social Connections: Not on file  Intimate Partner Violence: Not on file    SDOH:  SDOH Screenings   Alcohol Screen: Low Risk    Last Alcohol Screening Score (AUDIT): 3  Depression (PHQ2-9): Medium Risk   PHQ-2 Score: 15  Financial Resource Strain: Not on file  Food Insecurity: Not on file  Housing: Not on file  Physical Activity: Not on file  Social Connections: Not on file  Stress: Not on file  Tobacco Use: High Risk   Smoking Tobacco Use: Every Day   Smokeless Tobacco Use: Never  Transportation Needs: Not on file    Last Labs:  Admission on 10/25/2020  Component Date Value Ref Range Status   SARS Coronavirus 2 by RT PCR 10/26/2020 NEGATIVE  NEGATIVE Final   Comment: (NOTE) SARS-CoV-2 target nucleic acids are NOT DETECTED.  The  SARS-CoV-2 RNA is generally detectable in upper respiratory specimens during the acute phase of infection. The lowest concentration of SARS-CoV-2 viral copies this assay can detect is 138 copies/mL. A negative result does not preclude SARS-Cov-2 infection  and should not be used as the sole basis for treatment or other patient management decisions. A negative result may occur with  improper specimen collection/handling, submission of specimen other than nasopharyngeal swab, presence of viral mutation(s) within the areas targeted by this assay, and inadequate number of viral copies(<138 copies/mL). A negative result must be combined with clinical observations, patient history, and epidemiological information. The expected result is Negative.  Fact Sheet for Patients:  EntrepreneurPulse.com.au  Fact Sheet for Healthcare Providers:  IncredibleEmployment.be  This test is no                          t yet approved or cleared by the [place] FDA and  has been authorized for detection and/or diagnosis of SARS-CoV-2 by FDA under an Emergency Use Authorization (EUA). This EUA will remain  in effect (meaning this test can be used) for the duration of the COVID-19 declaration under Section 564(b)(1) of the Act, 21 U.S.C.section 360bbb-3(b)(1), unless the authorization is terminated  or revoked sooner.       Influenza A by PCR 10/26/2020 NEGATIVE  NEGATIVE Final   Influenza B by PCR 10/26/2020 NEGATIVE  NEGATIVE Final   Comment: (NOTE) The Xpert Xpress SARS-CoV-2/FLU/RSV plus assay is intended as an aid in the diagnosis of influenza from Nasopharyngeal swab specimens and should not be used as a sole basis for treatment. Nasal washings and aspirates are unacceptable for Xpert Xpress SARS-CoV-2/FLU/RSV testing.  Fact Sheet for Patients: EntrepreneurPulse.com.au  Fact Sheet for Healthcare  Providers: IncredibleEmployment.be  This test is not yet approved or cleared by the [place] FDA and has been authorized for detection and/or diagnosis of SARS-CoV-2 by FDA under an Emergency Use Authorization (EUA). This EUA will remain in effect (meaning this test can be used) for the duration of the COVID-19 declaration under Section 564(b)(1) of the Act, 21 U.S.C. section 360bbb-3(b)(1), unless the authorization is terminated or revoked.  Performed at [facility], [place] [address], [place], [name] [address]    SARS Coronavirus 2 Ag 10/26/2020 Negative  Negative Preliminary   WBC 10/26/2020 7.7  4.0 - 10.5 K/uL Final   RBC 10/26/2020 4.58  4.22 - 5.81 MIL/uL Final   Hemoglobin 10/26/2020 14.1  13.0 - 17.0 g/dL Final   HCT 10/26/2020 41.4  39.0 - 52.0 % Final   MCV 10/26/2020 90.4  80.0 - 100.0 fL Final   MCH 10/26/2020 30.8  26.0 - 34.0 pg Final   MCHC 10/26/2020 34.1  30.0 - 36.0 g/dL Final   RDW 10/26/2020 12.6  11.5 - 15.5 % Final   Platelets 10/26/2020 218  150 - 400 K/uL Final   nRBC 10/26/2020 0.0  0.0 - 0.2 % Final   Neutrophils Relative % 10/26/2020 58  % Final   Neutro Abs 10/26/2020 4.4  1.7 - 7.7 K/uL Final   Lymphocytes Relative 10/26/2020 36  % Final   Lymphs Abs 10/26/2020 2.8  0.7 - 4.0 K/uL Final   Monocytes Relative 10/26/2020 5  % Final   Monocytes Absolute 10/26/2020 0.4  0.1 - 1.0 K/uL Final   Eosinophils Relative 10/26/2020 1  % Final   Eosinophils Absolute 10/26/2020 0.1  0.0 - 0.5 K/uL Final   Basophils Relative 10/26/2020 0  % Final   Basophils Absolute 10/26/2020 0.0  0.0 - 0.1 K/uL Final   Immature Granulocytes 10/26/2020 0  % Final   Abs Immature Granulocytes 10/26/2020 0.00  0.00 - 0.07 K/uL Final  Performed at [facility], [place] [address] [place], [name] [address]   Sodium 10/26/2020 136  135 - 145 mmol/L Final   Potassium 10/26/2020 3.8  3.5 - 5.1 mmol/L Final   Chloride 10/26/2020 105  98 - 111 mmol/L  Final   CO2 10/26/2020 21 (A) 22 - 32 mmol/L Final   Glucose, Bld 10/26/2020 151 (A) 70 - 99 mg/dL Final   Glucose reference range applies only to samples taken after fasting for at least 8 hours.   BUN 10/26/2020 15  6 - 20 mg/dL Final   Creatinine, Ser 10/26/2020 1.16  0.61 - 1.24 mg/dL Final   Calcium 10/26/2020 9.7  8.9 - 10.3 mg/dL Final   Total Protein 10/26/2020 6.9  6.5 - 8.1 g/dL Final   Albumin 10/26/2020 4.1  3.5 - 5.0 g/dL Final   AST 10/26/2020 34  15 - 41 U/L Final   ALT 10/26/2020 38  0 - 44 U/L Final   Alkaline Phosphatase 10/26/2020 91  38 - 126 U/L Final   Total Bilirubin 10/26/2020 0.7  0.3 - 1.2 mg/dL Final   GFR, Estimated 10/26/2020 >60  >60 mL/min Final   Comment: (NOTE) Calculated using the CKD-EPI Creatinine Equation (2021)    Anion gap 10/26/2020 10  5 - 15 Final   Performed at [facility] [facility] [place] [address] [place], [name] [address]   Hgb A1c MFr Bld 10/26/2020 6.0 (A) 4.8 - 5.6 % Final   Comment: (NOTE) Pre diabetes:          5.7%-6.4%  Diabetes:              >6.4%  Glycemic control for   <7.0% adults with diabetes    Mean Plasma Glucose 10/26/2020 125.5  mg/dL Final   Performed at [facility], [place] [address], [place], [place] [address]   Alcohol, Ethyl (B) 10/26/2020 <10  <10 mg/dL Final   Comment: (NOTE) Lowest detectable limit for serum alcohol is 10 mg/dL.  For medical purposes only. Performed at [facility], [place] [address] [place], [place] [address]    Cholesterol 10/26/2020 153  0 - 200 mg/dL Final   Triglycerides 10/26/2020 80  <150 mg/dL Final   HDL 10/26/2020 43  >40 mg/dL Final   Total CHOL/HDL Ratio 10/26/2020 3.6  RATIO Final   VLDL 10/26/2020 16  0 - 40 mg/dL Final   LDL Cholesterol 10/26/2020 94  0 - 99 mg/dL Final   Comment:        Total Cholesterol/HDL:CHD Risk Coronary Heart Disease Risk Table                     Men   Women  1/2 Average Risk   3.4   3.3  Average Risk       5.0   4.4  2 X Average Risk    9.6   7.1  3 X Average Risk  23.4   11.0        Use the calculated Patient Ratio above and the CHD Risk Table to determine the patient's CHD Risk.        ATP III CLASSIFICATION (LDL):  <100     mg/dL   Optimal  100-129  mg/dL   Near or Above                    Optimal  130-159  mg/dL   Borderline  160-189  mg/dL   High  >  190     mg/dL   Very High Performed at [facility], [place] [address], [place], [place] [address]    TSH 10/26/2020 0.512  0.350 - 4.500 uIU/mL Final   Comment: Performed by a 3rd Generation assay with a functional sensitivity of <=0.01 uIU/mL. Performed at [facility], [place] [address], [place], [place] [address]   Admission on 08/24/2020, Discharged on 08/24/2020  Component Date Value Ref Range Status   WBC 08/24/2020 11.5 (A) 4.0 - 10.5 K/uL Final   RBC 08/24/2020 4.93  4.22 - 5.81 MIL/uL Final   Hemoglobin 08/24/2020 15.0  13.0 - 17.0 g/dL Final   HCT 08/24/2020 46.0  39.0 - 52.0 % Final   MCV 08/24/2020 93.3  80.0 - 100.0 fL Final   MCH 08/24/2020 30.4  26.0 - 34.0 pg Final   MCHC 08/24/2020 32.6  30.0 - 36.0 g/dL Final   RDW 08/24/2020 13.0  11.5 - 15.5 % Final   Platelets 08/24/2020 217  150 - 400 K/uL Final   nRBC 08/24/2020 0.0  0.0 - 0.2 % Final   Neutrophils Relative % 08/24/2020 73  % Final   Neutro Abs 08/24/2020 8.3 (A) 1.7 - 7.7 K/uL Final   Lymphocytes Relative 08/24/2020 20  % Final   Lymphs Abs 08/24/2020 2.3  0.7 - 4.0 K/uL Final   Monocytes Relative 08/24/2020 7  % Final   Monocytes Absolute 08/24/2020 0.8  0.1 - 1.0 K/uL Final   Eosinophils Relative 08/24/2020 0  % Final   Eosinophils Absolute 08/24/2020 0.0  0.0 - 0.5 K/uL Final   Basophils Relative 08/24/2020 0  % Final   Basophils Absolute 08/24/2020 0.0  0.0 - 0.1 K/uL Final   Immature Granulocytes 08/24/2020 0  % Final   Abs Immature Granulocytes 08/24/2020 0.04  0.00 - 0.07 K/uL Final   Performed at [facility], [place] [address] [place], [name]  [address]   Sodium 08/24/2020 138  135 - 145 mmol/L Final   Potassium 08/24/2020 4.3  3.5 - 5.1 mmol/L Final   Chloride 08/24/2020 103  98 - 111 mmol/L Final   CO2 08/24/2020 26  22 - 32 mmol/L Final   Glucose, Bld 08/24/2020 88  70 - 99 mg/dL Final   Glucose reference range applies only to samples taken after fasting for at least 8 hours.   BUN 08/24/2020 15  6 - 20 mg/dL Final   Creatinine, Ser 08/24/2020 1.21  0.61 - 1.24 mg/dL Final   Calcium 08/24/2020 9.8  8.9 - 10.3 mg/dL Final   Total Protein 08/24/2020 8.7 (A) 6.5 - 8.1 g/dL Final   Albumin 08/24/2020 4.8  3.5 - 5.0 g/dL Final   AST 08/24/2020 49 (A) 15 - 41 U/L Final   ALT 08/24/2020 47 (A) 0 - 44 U/L Final   Alkaline Phosphatase 08/24/2020 90  38 - 126 U/L Final   Total Bilirubin 08/24/2020 0.7  0.3 - 1.2 mg/dL Final   GFR, Estimated 08/24/2020 >60  >60 mL/min Final   Comment: (NOTE) Calculated using the CKD-EPI Creatinine Equation (2021)    Anion gap 08/24/2020 9  5 - 15 Final   Performed at [facility], [place] [address], [place], [place] [address]   Alcohol, Ethyl (B) 08/24/2020 <10  <10 mg/dL Final   Comment: (NOTE) Lowest detectable limit for serum alcohol is 10 mg/dL.  For medical purposes only. Performed at [facility], [place] [address], [place], [place] [address]  Acetaminophen (Tylenol), Serum 08/24/2020 <10 (A) 10 - 30 ug/mL Final   Comment: (NOTE) Therapeutic concentrations vary significantly. A range of 10-30 ug/mL  may be an effective concentration for many patients. However, some  are best treated at concentrations outside of this range. Acetaminophen concentrations >150 ug/mL at 4 hours after ingestion  and >50 ug/mL at 12 hours after ingestion are often associated with  toxic reactions.  Performed at [facility], [place] [address], [name], [name] [address]    Troponin I (High Sensitivity) 08/24/2020 5  <18 ng/L Final   Comment: (NOTE) Elevated  high sensitivity troponin I (hsTnI) values and significant  changes across serial measurements may suggest ACS but many other  chronic and acute conditions are known to elevate hsTnI results.  Refer to the "Links" section for chest pain algorithms and additional  guidance. Performed at [facility], [place] [address], [place], [place] [address]    SARS Coronavirus 2 by RT PCR 08/24/2020 NEGATIVE  NEGATIVE Final   Comment: (NOTE) SARS-CoV-2 target nucleic acids are NOT DETECTED.  The SARS-CoV-2 RNA is generally detectable in upper respiratory specimens during the acute phase of infection. The lowest concentration of SARS-CoV-2 viral copies this assay can detect is 138 copies/mL. A negative result does not preclude SARS-Cov-2 infection and should not be used as the sole basis for treatment or other patient management decisions. A negative result may occur with  improper specimen collection/handling, submission of specimen other than nasopharyngeal swab, presence of viral mutation(s) within the areas targeted by this assay, and inadequate number of viral copies(<138 copies/mL). A negative result must be combined with clinical observations, patient history, and epidemiological information. The expected result is Negative.  Fact Sheet for Patients:  EntrepreneurPulse.com.au  Fact Sheet for Healthcare Providers:  IncredibleEmployment.be  This test is no                          t yet approved or cleared by the [place] FDA and  has been authorized for detection and/or diagnosis of SARS-CoV-2 by FDA under an Emergency Use Authorization (EUA). This EUA will remain  in effect (meaning this test can be used) for the duration of the COVID-19 declaration under Section 564(b)(1) of the Act, 21 U.S.C.section 360bbb-3(b)(1), unless the authorization is terminated  or revoked sooner.       Influenza A by PCR 08/24/2020 NEGATIVE   NEGATIVE Final   Influenza B by PCR 08/24/2020 NEGATIVE  NEGATIVE Final   Comment: (NOTE) The Xpert Xpress SARS-CoV-2/FLU/RSV plus assay is intended as an aid in the diagnosis of influenza from Nasopharyngeal swab specimens and should not be used as a sole basis for treatment. Nasal washings and aspirates are unacceptable for Xpert Xpress SARS-CoV-2/FLU/RSV testing.  Fact Sheet for Patients: EntrepreneurPulse.com.au  Fact Sheet for Healthcare Providers: IncredibleEmployment.be  This test is not yet approved or cleared by the [place] FDA and has been authorized for detection and/or diagnosis of SARS-CoV-2 by FDA under an Emergency Use Authorization (EUA). This EUA will remain in effect (meaning this test can be used) for the duration of the COVID-19 declaration under Section 564(b)(1) of the Act, 21 U.S.C. section 360bbb-3(b)(1), unless the authorization is terminated or revoked.  Performed at [facility], [place] [address] [place], [place] [address]   Office Visit on 07/14/2020  Component Date Value Ref Range Status   Vitamin B-12 07/14/2020 403  200 - 1,100 pg/mL Final  Sed Rate 07/14/2020 2  0 - 20 mm/h Final   T3 Uptake 07/14/2020 29  22 - 35 % Final   T4, Total 07/14/2020 6.4  4.9 - 10.5 mcg/dL Final   Free Thyroxine Index 07/14/2020 1.9  1.4 - 3.8 Final   TSH 07/14/2020 1.59  0.40 - 4.50 mIU/L Final  Appointment on 07/06/2020  Component Date Value Ref Range Status   RPR Ser Ql 07/06/2020 NON-REACTIVE  NON-REACTIVE Final   CD4 T Cell Abs 07/06/2020 648  400 - 1,790 /uL Final   CD4 % Helper T Cell 07/06/2020 38  33 - 65 % Final   Performed at [facility], [place] [address] [place], [place] [address]   HIV 1 RNA Quant 07/06/2020 Not Detected  Copies/mL Final   HIV-1 RNA Quant, Log 07/06/2020 Not Detected  Log cps/mL Final   Comment: . Reference Range:                           Not Detected      copies/mL                           Not Detected Log copies/mL . Marland Kitchen The test was performed using Real-Time Polymerase Chain Reaction. . . Reportable Range: 20 copies/mL to 10,000,000 copies/mL (1.30 Log copies/mL to 7.00 Log copies/mL). .    Glucose, Bld 07/06/2020 95  65 - 99 mg/dL Final   Comment: .            Fasting reference interval .    BUN 07/06/2020 15  7 - 25 mg/dL Final   Creat 07/06/2020 1.06  0.70 - 1.33 mg/dL Final   Comment: For patients >80 years of age, the reference limit for Creatinine is approximately 13% higher for people identified as African-American. .    GFR, Est Non African American 07/06/2020 79  > OR = 60 mL/min/1.68m Final   GFR, Est African American 07/06/2020 92  > OR = 60 mL/min/1.751mFinal   BUN/Creatinine Ratio [id number] APPLICABLE  6 - 22 (calc) Final   Sodium 07/06/2020 138  135 - 146 mmol/L Final   Potassium 07/06/2020 4.3  3.5 - 5.3 mmol/L Final   Chloride 07/06/2020 107  98 - 110 mmol/L Final   CO2 07/06/2020 24  20 - 32 mmol/L Final   Calcium 07/06/2020 9.2  8.6 - 10.3 mg/dL Final   Total Protein 07/06/2020 6.8  6.1 - 8.1 g/dL Final   Albumin 07/06/2020 4.1  3.6 - 5.1 g/dL Final   Globulin 07/06/2020 2.7  1.9 - 3.7 g/dL (calc) Final   AG Ratio 07/06/2020 1.5  1.0 - 2.5 (calc) Final   Total Bilirubin 07/06/2020 0.4  0.2 - 1.2 mg/dL Final   Alkaline phosphatase (APISO) 07/06/2020 87  35 - 144 U/L Final   AST 07/06/2020 32  10 - 35 U/L Final   ALT 07/06/2020 33  9 - 46 U/L Final   WBC 07/06/2020 4.5  3.8 - 10.8 Thousand/uL Final   RBC 07/06/2020 4.50  4.20 - 5.80 Million/uL Final   Hemoglobin 07/06/2020 13.7  13.2 - 17.1 g/dL Final   HCT 07/06/2020 41.0  38.5 - 50.0 % Final   MCV 07/06/2020 91.1  80.0 - 100.0 fL Final   MCH 07/06/2020 30.4  27.0 - 33.0 pg Final   MCHC 07/06/2020 33.4  32.0 - 36.0 g/dL Final   RDW 07/06/2020 12.9  11.0 - 15.0 % Final   Platelets 07/06/2020 163  140 - 400 Thousand/uL Final   MPV 07/06/2020 11.7   7.5 - 12.5 fL Final   Neutro Abs 07/06/2020 2,183  1,500 - 7,800 cells/uL Final   Lymphs Abs 07/06/2020 1,778  850 - 3,900 cells/uL Final   Absolute Monocytes 07/06/2020 351  200 - 950 cells/uL Final   Eosinophils Absolute 07/06/2020 171  15 - 500 cells/uL Final   Basophils Absolute 07/06/2020 18  0 - 200 cells/uL Final   Neutrophils Relative % 07/06/2020 48.5  % Final   Total Lymphocyte 07/06/2020 39.5  % Final   Monocytes Relative 07/06/2020 7.8  % Final   Eosinophils Relative 07/06/2020 3.8  % Final   Basophils Relative 07/06/2020 0.4  % Final   Neisseria Gonorrhea 07/06/2020 Negative   Final   Chlamydia 07/06/2020 Negative   Final   Comment 07/06/2020 Normal Reference Ranger Chlamydia - Negative   Final   Comment 07/06/2020 Normal Reference Range Neisseria Gonorrhea - Negative   Final  Admission on 04/28/2020, Discharged on 04/29/2020  Component Date Value Ref Range Status   Cholesterol 04/28/2020 177  0 - 200 mg/dL Final   Triglycerides 04/28/2020 105  <150 mg/dL Final   HDL 04/28/2020 55  >40 mg/dL Final   Total CHOL/HDL Ratio 04/28/2020 3.2  RATIO Final   VLDL 04/28/2020 21  0 - 40 mg/dL Final   LDL Cholesterol 04/28/2020 101 (A) 0 - 99 mg/dL Final   Comment:        Total Cholesterol/HDL:CHD Risk Coronary Heart Disease Risk Table                     Men   Women  1/2 Average Risk   3.4   3.3  Average Risk       5.0   4.4  2 X Average Risk   9.6   7.1  3 X Average Risk  23.4   11.0        Use the calculated Patient Ratio above and the CHD Risk Table to determine the patient's CHD Risk.        ATP III CLASSIFICATION (LDL):  <100     mg/dL   Optimal  100-129  mg/dL   Near or Above                    Optimal  130-159  mg/dL   Borderline  160-189  mg/dL   High  >190     mg/dL   Very High Performed at [facility], [place], [name] [address]    Hgb A1c MFr Bld 04/28/2020 6.2 (A) 4.8 - 5.6 % Final   Comment: (NOTE) Pre diabetes:           5.7%-6.4%  Diabetes:              >6.4%  Glycemic control for   <7.0% adults with diabetes    Mean Plasma Glucose 04/28/2020 131.24  mg/dL Final   Performed at [facility] [address] [place], [place] [address]   Hepatitis B Surface Ag 04/28/2020 Reactive (A) NON REACTIVE Final   Sample Positive for HBsAg. Result confirmed by neutralization.   HCV Ab 04/28/2020 NON REACTIVE  NON REACTIVE Final   Comment: (NOTE) Nonreactive HCV antibody screen is consistent with no HCV infections,  unless recent infection is suspected or other evidence exists to indicate HCV infection.  Hep A IgM 04/28/2020 NON REACTIVE  NON REACTIVE Final   Hep B C IgM 04/28/2020 NON REACTIVE  NON REACTIVE Final   Performed at [facility], [place] [address] [place], [place] [address]  Admission on 04/27/2020, Discharged on 04/27/2020  Component Date Value Ref Range Status   SARS Coronavirus 2 by RT PCR 04/27/2020 NEGATIVE  NEGATIVE Final   Comment: (NOTE) SARS-CoV-2 target nucleic acids are NOT DETECTED.  The SARS-CoV-2 RNA is generally detectable in upper respiratory specimens during the acute phase of infection. The lowest concentration of SARS-CoV-2 viral copies this assay can detect is 138 copies/mL. A negative result does not preclude SARS-Cov-2 infection and should not be used as the sole basis for treatment or other patient management decisions. A negative result may occur with  improper specimen collection/handling, submission of specimen other than nasopharyngeal swab, presence of viral mutation(s) within the areas targeted by this assay, and inadequate number of viral copies(<138 copies/mL). A negative result must be combined with clinical observations, patient history, and epidemiological information. The expected result is Negative.  Fact Sheet for Patients:  EntrepreneurPulse.com.au  Fact Sheet for Healthcare Providers:   IncredibleEmployment.be  This test is no                          t yet approved or cleared by the [place] FDA and  has been authorized for detection and/or diagnosis of SARS-CoV-2 by FDA under an Emergency Use Authorization (EUA). This EUA will remain  in effect (meaning this test can be used) for the duration of the COVID-19 declaration under Section 564(b)(1) of the Act, 21 U.S.C.section 360bbb-3(b)(1), unless the authorization is terminated  or revoked sooner.       Influenza A by PCR 04/27/2020 NEGATIVE  NEGATIVE Final   Influenza B by PCR 04/27/2020 NEGATIVE  NEGATIVE Final   Comment: (NOTE) The Xpert Xpress SARS-CoV-2/FLU/RSV plus assay is intended as an aid in the diagnosis of influenza from Nasopharyngeal swab specimens and should not be used as a sole basis for treatment. Nasal washings and aspirates are unacceptable for Xpert Xpress SARS-CoV-2/FLU/RSV testing.  Fact Sheet for Patients: EntrepreneurPulse.com.au  Fact Sheet for Healthcare Providers: IncredibleEmployment.be  This test is not yet approved or cleared by the [place] FDA and has been authorized for detection and/or diagnosis of SARS-CoV-2 by FDA under an Emergency Use Authorization (EUA). This EUA will remain in effect (meaning this test can be used) for the duration of the COVID-19 declaration under Section 564(b)(1) of the Act, 21 U.S.C. section 360bbb-3(b)(1), unless the authorization is terminated or revoked.  Performed at [facility], [place] [address], [place], [name] [address]    Sodium 04/27/2020 138  135 - 145 mmol/L Final   Potassium 04/27/2020 4.1  3.5 - 5.1 mmol/L Final   Chloride 04/27/2020 104  98 - 111 mmol/L Final   CO2 04/27/2020 25  22 - 32 mmol/L Final   Glucose, Bld 04/27/2020 115 (A) 70 - 99 mg/dL Final   Glucose reference range applies only to samples taken after fasting for at least 8 hours.    BUN 04/27/2020 23 (A) 6 - 20 mg/dL Final   Creatinine, Ser 04/27/2020 1.25 (A) 0.61 - 1.24 mg/dL Final   Calcium 04/27/2020 9.8  8.9 - 10.3 mg/dL Final   Total Protein 04/27/2020 8.0  6.5 - 8.1 g/dL Final   Albumin 04/27/2020 4.7  3.5 - 5.0 g/dL Final   AST  04/27/2020 383 (A) 15 - 41 U/L Final   ALT 04/27/2020 119 (A) 0 - 44 U/L Final   Alkaline Phosphatase 04/27/2020 86  38 - 126 U/L Final   Total Bilirubin 04/27/2020 1.0  0.3 - 1.2 mg/dL Final   GFR, Estimated 04/27/2020 >60  >60 mL/min Final   Comment: (NOTE) Calculated using the CKD-EPI Creatinine Equation (2021)    Anion gap 04/27/2020 9  5 - 15 Final   Performed at [facility], [place] [address] [place], [name] [address]   Alcohol, Ethyl (B) 04/27/2020 <10  <10 mg/dL Final   Comment: (NOTE) Lowest detectable limit for serum alcohol is 10 mg/dL.  For medical purposes only. Performed at [facility], [place] [address], [place], [name] [address]    Opiates 04/27/2020 NONE DETECTED  NONE DETECTED Final   Cocaine 04/27/2020 POSITIVE (A) NONE DETECTED Final   Benzodiazepines 04/27/2020 NONE DETECTED  NONE DETECTED Final   Amphetamines 04/27/2020 NONE DETECTED  NONE DETECTED Final   Tetrahydrocannabinol 04/27/2020 NONE DETECTED  NONE DETECTED Final   Barbiturates 04/27/2020 NONE DETECTED  NONE DETECTED Final   Comment: (NOTE) DRUG SCREEN FOR MEDICAL PURPOSES ONLY.  IF CONFIRMATION IS NEEDED FOR ANY PURPOSE, NOTIFY LAB WITHIN 5 DAYS.  LOWEST DETECTABLE LIMITS FOR URINE DRUG SCREEN Drug Class                     Cutoff (ng/mL) Amphetamine and metabolites    1000 Barbiturate and metabolites    200 Benzodiazepine                 [id number] Tricyclics and metabolites     300 Opiates and metabolites        300 Cocaine and metabolites        300 THC                            50 Performed at [facility], [place] [address], [place], [name] [address]    WBC 04/27/2020 13.5 (A) 4.0 -  10.5 K/uL Final   RBC 04/27/2020 4.69  4.22 - 5.81 MIL/uL Final   Hemoglobin 04/27/2020 14.6  13.0 - 17.0 g/dL Final   HCT 04/27/2020 42.8  39.0 - 52.0 % Final   MCV 04/27/2020 91.3  80.0 - 100.0 fL Final   MCH 04/27/2020 31.1  26.0 - 34.0 pg Final   MCHC 04/27/2020 34.1  30.0 - 36.0 g/dL Final   RDW 04/27/2020 13.1  11.5 - 15.5 % Final   Platelets 04/27/2020 195  150 - 400 K/uL Final   nRBC 04/27/2020 0.0  0.0 - 0.2 % Final   Neutrophils Relative % 04/27/2020 77  % Final   Neutro Abs 04/27/2020 10.4 (A) 1.7 - 7.7 K/uL Final   Lymphocytes Relative 04/27/2020 15  % Final   Lymphs Abs 04/27/2020 2.0  0.7 - 4.0 K/uL Final   Monocytes Relative 04/27/2020 8  % Final   Monocytes Absolute 04/27/2020 1.0  0.1 - 1.0 K/uL Final   Eosinophils Relative 04/27/2020 0  % Final   Eosinophils Absolute 04/27/2020 0.0  0.0 - 0.5 K/uL Final   Basophils Relative 04/27/2020 0  % Final   Basophils Absolute 04/27/2020 0.0  0.0 - 0.1 K/uL Final   Immature Granulocytes 04/27/2020 0  % Final   Abs Immature Granulocytes 04/27/2020 0.04  0.00 - 0.07 K/uL Final   Performed at [facility],  [place] [address], [place], [name] [id number]   Salicylate Lvl 99991111 <7.0 (A) 7.0 - 30.0 mg/dL Final   Performed at [facility] [address], [place], [name] [address]   Acetaminophen (Tylenol), Serum 04/27/2020 <10 (A) 10 - 30 ug/mL Final   Comment: (NOTE) Therapeutic concentrations vary significantly. A range of 10-30 ug/mL  may be an effective concentration for many patients. However, some  are best treated at concentrations outside of this range. Acetaminophen concentrations >150 ug/mL at 4 hours after ingestion  and >50 ug/mL at 12 hours after ingestion are often associated with  toxic reactions.  Performed at [facility], [place] [address], [place], [place] [address]     Allergies: Penicillins, Latex, and Tape  PTA Medications: (Not in a hospital  admission)   Medical Decision Making  Patient admitted to [facility] unit for safety and stabilization Clinical Course as of 10/26/20 1617  Tue Oct 26, 2020  0244 Lipid panel Lipid Panel unremarkable [JB]  0244 Hemoglobin A1C(!): 6.0 Slightly elevated, consistent with previous results [JB]  0244 Alcohol, Ethyl (B): <10 [JB]  0245 Glucose(!): 151 Glucose 151-CMP otherwise unremarkable [JB]  0245 TSH: 0.512 [JB]  0245 SARS Coronavirus 2 by RT PCR: NEGATIVE [JB]  0245 CBC with Differential/Platelet CBC unremarkable [JB]  0549 EKG 12-Lead EKG similar to EKG from 08/24/2020  Vent. rate 67 BPM PR interval 168 ms QRS duration 102 ms QT/QTcB 440/464 ms P-R-T axes 52 53 42 Sinus rhythm with Premature atrial complexes with Abberant conduction Minimal voltage criteria for LVH, may be normal variant ( Sokolow-Lyon ) Borderline ECG [JB]    Clinical Course User Index [JB] Rozetta Nunnery, NP    Recommendations  Based on my evaluation the patient does not appear to have an emergency medical condition. [place] admission  Kaysia Willard, NP 10/26/20  4:17 PM

## 2020-10-26 NOTE — ED Provider Notes (Addendum)
Behavioral Health Admission H&P [facility] & OBS)  Date: 10/26/20 Patient Name: Dylan [name] MRN: [id number] Chief Complaint:  Chief Complaint  Patient presents with  . Suicidal      Diagnoses:  Final diagnoses:  Schizoaffective disorder, depressive type ([place])    HPI: Dylan [name] is a 55 y.o. male with a history of schizoaffective disorder who presents to [facility] voluntarily with law enforcement due to to [place]. Patient reports feeling paranoid that people are watching him.  Patient states he has not taken his medication in approximately 1 month.  He states that he went to [place] for several months to help take care of his mother.  He states since he returned to [facility] about 6 weeks ago he has not had the opportunity to see a behavioral health provider.  Patient states that he is suicidal with a plan to jump from a parking garage.  Patient denies homicidal ideations.  He reports auditory hallucinations of voices that he does not want to discuss at this time.  He reports visual hallucinations of shadowy figures.  He does not appear to be responding to internal stimuli.  He reports that he drinks 2 to 3 12 ounce beers per day.  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  Patient was last inpatient at [facility] in April 2022.  At that time he was treated for schizoaffective disorder and GAD.  He was discharged on quetiapine 200 mg nightly, trazodone 150 mg nightly, and hydroxyzine 25 mg 3 times daily as needed.  Patient reports that he also takes gabapentin, Biktarvy, and Ativan.  On evaluation patient is alert and oriented x 4, pleasant, and cooperative. Speech is clear and coherent. Mood is depressed and affect is congruent with mood. Thought process is coherent and thought content is logical. He reports auditory hallucinations of voices that he does not want to discuss at this time.   He reports visual hallucinations of shadowy figures.  He does not appear to be responding to internal stimuli. He reports feeling paranoid that people are watching him. Reports SI with plan to jump from parking garage. Denies homicidal ideations.     PHQ 2-9:  [place] Office Visit from 03/17/2020 in [place] ED from 11/09/2019 in [facility] Office Visit from 07/22/2019 in [place]  Thoughts that you would be better off dead, or of hurting yourself in some way Not at all More than half the days Not at all  PHQ-9 Total Score '9 19 15       '$ Flowsheet Row ED from 08/24/2020 in [place] DEPT Admission (Discharged) from 04/28/2020 in [place] ED from 04/27/2020 in [place] DEPT  C-SSRS RISK CATEGORY High Risk Moderate Risk High Risk        Total Time spent with patient: 45 minutes  Musculoskeletal  Strength & Muscle Tone: within normal limits Gait & Station: normal Patient leans: N/A  Psychiatric Specialty Exam  Presentation General Appearance: Casual; Appropriate for Environment  Eye Contact:Good  Speech:Clear and Coherent; Normal Rate  Speech Volume:Normal  Handedness:Right   Mood and Affect  Mood:Depressed  Affect:Congruent   Thought Process  Thought Processes:Coherent  Descriptions of Associations:Intact  Orientation:Full (Time, Place and Person)  Thought Content:Logical  Diagnosis of Schizophrenia or Schizoaffective disorder in past: Yes  Duration  of Psychotic Symptoms: Greater than six months  Hallucinations:Hallucinations: Auditory; Visual Description of Auditory Hallucinations: does not want to discuss Description of Visual Hallucinations: shadow figures  Ideas of Reference:None  Suicidal Thoughts:Suicidal Thoughts: Yes, Active SI Active Intent and/or Plan: With Intent;  With Plan; With Means to Carry Out  Homicidal Thoughts:Homicidal Thoughts: No   Sensorium  Memory:Immediate Good; Recent Good; Remote Good  Judgment:Impaired  Insight:Fair   Executive Functions  Concentration:Fair  Attention Span:Fair  [place]  Language:Good   Psychomotor Activity  Psychomotor Activity:Psychomotor Activity: Normal   Assets  Assets:Communication Skills; Desire for Improvement; Financial Resources/Insurance; Housing; Resilience   Sleep  Sleep:Sleep: Fair   Nutritional Assessment (For OBS and FBC admissions only) Has the patient had a weight loss or gain of 10 pounds or more in the last 3 months?: No Has the patient had a decrease in food intake/or appetite?: No Does the patient have dental problems?: No Does the patient have eating habits or behaviors that may be indicators of an eating disorder including binging or inducing vomiting?: No Has the patient recently lost weight without trying?: 0 Has the patient been eating poorly because of a decreased appetite?: 0 Malnutrition Screening Tool Score: 0    Physical Exam Constitutional:      General: He is not in acute distress.    Appearance: He is not ill-appearing, toxic-appearing or diaphoretic.  HENT:     Head: Normocephalic.     Right Ear: External ear normal.     Left Ear: External ear normal.  Eyes:     Pupils: Pupils are equal, round, and reactive to light.  Cardiovascular:     Rate and Rhythm: Normal rate.  Pulmonary:     Effort: Pulmonary effort is normal. No respiratory distress.  Musculoskeletal:        General: Normal range of motion.  Skin:    General: Skin is warm and dry.  Neurological:     Mental Status: He is alert and oriented to person, place, and time.  Psychiatric:        Mood and Affect: Mood is anxious and depressed.        Speech: Speech normal.        Behavior: Behavior is cooperative.        Thought Content: Thought content is  paranoid. Thought content includes suicidal ideation. Thought content does not include homicidal ideation. Thought content includes suicidal plan.   Review of Systems  Constitutional:  Negative for chills, diaphoresis, fever, malaise/fatigue and weight loss.  HENT:  Negative for congestion.   Respiratory:  Negative for cough and shortness of breath.   Cardiovascular:  Negative for chest pain and palpitations.  Gastrointestinal:  Negative for diarrhea, nausea and vomiting.  Neurological:  Negative for dizziness and seizures.  Psychiatric/Behavioral:  Positive for depression and suicidal ideas. Negative for hallucinations, memory loss and substance abuse. The patient has insomnia. The patient is not nervous/anxious.   All other systems reviewed and are negative.  Blood pressure 129/89, pulse 71, temperature 98.2 F (36.8 C), temperature source Oral, resp. rate 18, SpO2 99 %. There is no height or weight on file to calculate BMI.  Past Psychiatric History:  Patient was last inpatient at [facility] in April 2022.  At that time he was treated for schizoaffective disorder and GAD.  He was discharged on quetiapine 200 mg nightly, trazodone 150 mg nightly, and hydroxyzine 25 mg 3 times daily as needed.  Patient reports that  he also takes gabapentin, Biktarvy, and Ativan.  Is the patient at risk to self? Yes  Has the patient been a risk to self in the past 6 months? Yes .    Has the patient been a risk to self within the distant past? Yes   Is the patient a risk to others? No   Has the patient been a risk to others in the past 6 months? No   Has the patient been a risk to others within the distant past? No   Past Medical History:  Past Medical History:  Diagnosis Date  . Anxiety   . Arthritis   . Bipolar 1 disorder ([place])   . Colon polyps   . Depression   . GERD (gastroesophageal reflux disease)   . Hepatitis B   . Human immunodeficiency virus (HIV) ([place])   .  Hyperlipidemia   . Hypertension   . Neuromuscular disorder (HCC)    neuropathy  . Neuropathy   . Pre-diabetes   . Schizophrenia [facility])     Past Surgical History:  Procedure Laterality Date  . FOOT ARTHRODESIS Right 09/17/2019   Procedure: FUSION RIGHT LISFRANC JOINT;  Surgeon: Newt Minion, MD;  Location: [place];  Service: Orthopedics;  Laterality: Right;  . FOOT ARTHRODESIS Right 09/2019  . HEMORROIDECTOMY    . IR FLUORO GUIDE CV LINE RIGHT  02/22/2017  . IR US GUIDE VASC ACCESS RIGHT  02/22/2017  . TUMOR REMOVAL     From Chest    Family History:  Family History  Problem Relation Age of Onset  . CAD Mother   . Diabetes Father   . Colon cancer Maternal Aunt 31  . Diabetes Maternal Aunt   . Heart disease Maternal Uncle   . Esophageal cancer Neg Hx   . Rectal cancer Neg Hx   . Stomach cancer Neg Hx     Social History:  Social History   Socioeconomic History  . Marital status: Married    Spouse name: Not on file  . Number of children: 1  . Years of education: Not on file  . Highest education level: Not on file  Occupational History  . Not on file  Tobacco Use  . Smoking status: Every Day    Packs/day: 0.50    Years: 15.00    Pack years: 7.50    Types: E-cigarettes, Cigarettes  . Smokeless tobacco: Never  . Tobacco comments:    has patches when he is ready to quit  Vaping Use  . Vaping Use: Some days  . Substances: Nicotine  Substance and Sexual Activity  . Alcohol use: Yes    Comment: occassional use on weekends  . Drug use: Yes    Types: Cocaine, Marijuana    Comment: last use was Friday 03/05/2020  . Sexual activity: Yes    Partners: Female    Birth control/protection: Condom    Comment: condoms given  Other Topics Concern  . Not on file  Social History Narrative  . Not on file   Social Determinants of Health   Financial Resource Strain: Not on file  Food Insecurity: Not on file  Transportation Needs: Not on file  Physical Activity: Not on file   Stress: Not on file  Social Connections: Not on file  Intimate Partner Violence: Not on file    SDOH:  SDOH Screenings   Alcohol Screen: Low Risk   . Last Alcohol Screening Score (AUDIT): 3  Depression (PHQ2-9): Low Risk   . PHQ-2  Score: 0  Financial Resource Strain: Not on file  Food Insecurity: Not on file  Housing: Not on file  Physical Activity: Not on file  Social Connections: Not on file  Stress: Not on file  Tobacco Use: High Risk  . Smoking Tobacco Use: Every Day  . Smokeless Tobacco Use: Never  Transportation Needs: Not on file    Last Labs:  Admission on 08/24/2020, Discharged on 08/24/2020  Component Date Value Ref Range Status  . WBC 08/24/2020 11.5 (A) 4.0 - 10.5 K/uL Final  . RBC 08/24/2020 4.93  4.22 - 5.81 MIL/uL Final  . Hemoglobin 08/24/2020 15.0  13.0 - 17.0 g/dL Final  . HCT 08/24/2020 46.0  39.0 - 52.0 % Final  . MCV 08/24/2020 93.3  80.0 - 100.0 fL Final  . MCH 08/24/2020 30.4  26.0 - 34.0 pg Final  . MCHC 08/24/2020 32.6  30.0 - 36.0 g/dL Final  . RDW 08/24/2020 13.0  11.5 - 15.5 % Final  . Platelets 08/24/2020 217  150 - 400 K/uL Final  . nRBC 08/24/2020 0.0  0.0 - 0.2 % Final  . Neutrophils Relative % 08/24/2020 73  % Final  . Neutro Abs 08/24/2020 8.3 (A) 1.7 - 7.7 K/uL Final  . Lymphocytes Relative 08/24/2020 20  % Final  . Lymphs Abs 08/24/2020 2.3  0.7 - 4.0 K/uL Final  . Monocytes Relative 08/24/2020 7  % Final  . Monocytes Absolute 08/24/2020 0.8  0.1 - 1.0 K/uL Final  . Eosinophils Relative 08/24/2020 0  % Final  . Eosinophils Absolute 08/24/2020 0.0  0.0 - 0.5 K/uL Final  . Basophils Relative 08/24/2020 0  % Final  . Basophils Absolute 08/24/2020 0.0  0.0 - 0.1 K/uL Final  . Immature Granulocytes 08/24/2020 0  % Final  . Abs Immature Granulocytes 08/24/2020 0.04  0.00 - 0.07 K/uL Final   Performed at [facility], [place] [address] [place], [place] [address]  . Sodium 08/24/2020 138  135 - 145 mmol/L Final  .  Potassium 08/24/2020 4.3  3.5 - 5.1 mmol/L Final  . Chloride 08/24/2020 103  98 - 111 mmol/L Final  . CO2 08/24/2020 26  22 - 32 mmol/L Final  . Glucose, Bld 08/24/2020 88  70 - 99 mg/dL Final   Glucose reference range applies only to samples taken after fasting for at least 8 hours.  . BUN 08/24/2020 15  6 - 20 mg/dL Final  . Creatinine, Ser 08/24/2020 1.21  0.61 - 1.24 mg/dL Final  . Calcium 08/24/2020 9.8  8.9 - 10.3 mg/dL Final  . Total Protein 08/24/2020 8.7 (A) 6.5 - 8.1 g/dL Final  . Albumin 08/24/2020 4.8  3.5 - 5.0 g/dL Final  . AST 08/24/2020 49 (A) 15 - 41 U/L Final  . ALT 08/24/2020 47 (A) 0 - 44 U/L Final  . Alkaline Phosphatase 08/24/2020 90  38 - 126 U/L Final  . Total Bilirubin 08/24/2020 0.7  0.3 - 1.2 mg/dL Final  . GFR, Estimated 08/24/2020 >60  >60 mL/min Final   Comment: (NOTE) Calculated using the CKD-EPI Creatinine Equation (2021)   . Anion gap 08/24/2020 9  5 - 15 Final   Performed at [facility], [place] [address] [place], [place] [address]  . Alcohol, Ethyl (B) 08/24/2020 <10  <10 mg/dL Final   Comment: (NOTE) Lowest detectable limit for serum alcohol is 10 mg/dL.  For medical purposes only. Performed at [facility], [place] [address] [place], [place] [address]   .  Acetaminophen (Tylenol), Serum 08/24/2020 <10 (A) 10 - 30 ug/mL Final   Comment: (NOTE) Therapeutic concentrations vary significantly. A range of 10-30 ug/mL  may be an effective concentration for many patients. However, some  are best treated at concentrations outside of this range. Acetaminophen concentrations >150 ug/mL at 4 hours after ingestion  and >50 ug/mL at 12 hours after ingestion are often associated with  toxic reactions.  Performed at [facility], [place] [address], [place], [place] [address]   . Troponin I (High Sensitivity) 08/24/2020 5  <18 ng/L Final   Comment: (NOTE) Elevated high sensitivity troponin I (hsTnI) values  and significant  changes across serial measurements may suggest ACS but many other  chronic and acute conditions are known to elevate hsTnI results.  Refer to the "Links" section for chest pain algorithms and additional  guidance. Performed at [facility], [place] [address] [place], [place] [address]   . SARS Coronavirus 2 by RT PCR 08/24/2020 NEGATIVE  NEGATIVE Final   Comment: (NOTE) SARS-CoV-2 target nucleic acids are NOT DETECTED.  The SARS-CoV-2 RNA is generally detectable in upper respiratory specimens during the acute phase of infection. The lowest concentration of SARS-CoV-2 viral copies this assay can detect is 138 copies/mL. A negative result does not preclude SARS-Cov-2 infection and should not be used as the sole basis for treatment or other patient management decisions. A negative result may occur with  improper specimen collection/handling, submission of specimen other than nasopharyngeal swab, presence of viral mutation(s) within the areas targeted by this assay, and inadequate number of viral copies(<138 copies/mL). A negative result must be combined with clinical observations, patient history, and epidemiological information. The expected result is Negative.  Fact Sheet for Patients:  EntrepreneurPulse.com.au  Fact Sheet for Healthcare Providers:  IncredibleEmployment.be  This test is no                          t yet approved or cleared by the [place] FDA and  has been authorized for detection and/or diagnosis of SARS-CoV-2 by FDA under an Emergency Use Authorization (EUA). This EUA will remain  in effect (meaning this test can be used) for the duration of the COVID-19 declaration under Section 564(b)(1) of the Act, 21 U.S.C.section 360bbb-3(b)(1), unless the authorization is terminated  or revoked sooner.      . Influenza A by PCR 08/24/2020 NEGATIVE  NEGATIVE Final  . Influenza B by PCR  08/24/2020 NEGATIVE  NEGATIVE Final   Comment: (NOTE) The Xpert Xpress SARS-CoV-2/FLU/RSV plus assay is intended as an aid in the diagnosis of influenza from Nasopharyngeal swab specimens and should not be used as a sole basis for treatment. Nasal washings and aspirates are unacceptable for Xpert Xpress SARS-CoV-2/FLU/RSV testing.  Fact Sheet for Patients: EntrepreneurPulse.com.au  Fact Sheet for Healthcare Providers: IncredibleEmployment.be  This test is not yet approved or cleared by the [place] FDA and has been authorized for detection and/or diagnosis of SARS-CoV-2 by FDA under an Emergency Use Authorization (EUA). This EUA will remain in effect (meaning this test can be used) for the duration of the COVID-19 declaration under Section 564(b)(1) of the Act, 21 U.S.C. section 360bbb-3(b)(1), unless the authorization is terminated or revoked.  Performed at [facility], [place] [address], [place], [place] [address]   Office Visit on 07/14/2020  Component Date Value Ref Range Status  . Vitamin B-12 07/14/2020 403  200 - 1,100 pg/mL Final  .  Sed Rate 07/14/2020 2  0 - 20 mm/h Final  . T3 Uptake 07/14/2020 29  22 - 35 % Final  . T4, Total 07/14/2020 6.4  4.9 - 10.5 mcg/dL Final  . Free Thyroxine Index 07/14/2020 1.9  1.4 - 3.8 Final  . TSH 07/14/2020 1.59  0.40 - 4.50 mIU/L Final  Appointment on 07/06/2020  Component Date Value Ref Range Status  . RPR Ser Ql 07/06/2020 NON-REACTIVE  NON-REACTIVE Final  . CD4 T Cell Abs 07/06/2020 648  400 - 1,790 /uL Final  . CD4 % Helper T Cell 07/06/2020 38  33 - 65 % Final   Performed at [facility], [place] [address], [place], [place] [address]  . HIV 1 RNA Quant 07/06/2020 Not Detected  Copies/mL Final  . HIV-1 RNA Quant, Log 07/06/2020 Not Detected  Log cps/mL Final   Comment: . Reference Range:                           Not Detected     copies/mL                            Not Detected Log copies/mL . Marland Kitchen The test was performed using Real-Time Polymerase Chain Reaction. . . Reportable Range: 20 copies/mL to 10,000,000 copies/mL (1.30 Log copies/mL to 7.00 Log copies/mL). .   . Glucose, Bld 07/06/2020 95  65 - 99 mg/dL Final   Comment: .            Fasting reference interval .   . BUN 07/06/2020 15  7 - 25 mg/dL Final  . Creat 07/06/2020 1.06  0.70 - 1.33 mg/dL Final   Comment: For patients >49 years of age, the reference limit for Creatinine is approximately 13% higher for people identified as African-American. .   . GFR, Est Non African American 07/06/2020 79  > OR = 60 mL/min/1.48m Final  . GFR, Est African American 07/06/2020 92  > OR = 60 mL/min/1.728mFinal  . BUN/Creatinine Ratio [id number] APPLICABLE  6 - 22 (calc) Final  . Sodium 07/06/2020 138  135 - 146 mmol/L Final  . Potassium 07/06/2020 4.3  3.5 - 5.3 mmol/L Final  . Chloride 07/06/2020 107  98 - 110 mmol/L Final  . CO2 07/06/2020 24  20 - 32 mmol/L Final  . Calcium 07/06/2020 9.2  8.6 - 10.3 mg/dL Final  . Total Protein 07/06/2020 6.8  6.1 - 8.1 g/dL Final  . Albumin 07/06/2020 4.1  3.6 - 5.1 g/dL Final  . Globulin 07/06/2020 2.7  1.9 - 3.7 g/dL (calc) Final  . AG Ratio 07/06/2020 1.5  1.0 - 2.5 (calc) Final  . Total Bilirubin 07/06/2020 0.4  0.2 - 1.2 mg/dL Final  . Alkaline phosphatase (APISO) 07/06/2020 87  35 - 144 U/L Final  . AST 07/06/2020 32  10 - 35 U/L Final  . ALT 07/06/2020 33  9 - 46 U/L Final  . WBC 07/06/2020 4.5  3.8 - 10.8 Thousand/uL Final  . RBC 07/06/2020 4.50  4.20 - 5.80 Million/uL Final  . Hemoglobin 07/06/2020 13.7  13.2 - 17.1 g/dL Final  . HCT 07/06/2020 41.0  38.5 - 50.0 % Final  . MCV 07/06/2020 91.1  80.0 - 100.0 fL Final  . MCH 07/06/2020 30.4  27.0 - 33.0 pg Final  . MCHC 07/06/2020 33.4  32.0 - 36.0 g/dL Final  . RDW 07/06/2020 12.9  11.0 - 15.0 % Final  . Platelets 07/06/2020 163  140 - 400 Thousand/uL Final  . MPV 07/06/2020  11.7  7.5 - 12.5 fL Final  . Neutro Abs 07/06/2020 2,183  1,500 - 7,800 cells/uL Final  . Lymphs Abs 07/06/2020 1,778  850 - 3,900 cells/uL Final  . Absolute Monocytes 07/06/2020 351  200 - 950 cells/uL Final  . Eosinophils Absolute 07/06/2020 171  15 - 500 cells/uL Final  . Basophils Absolute 07/06/2020 18  0 - 200 cells/uL Final  . Neutrophils Relative % 07/06/2020 48.5  % Final  . Total Lymphocyte 07/06/2020 39.5  % Final  . Monocytes Relative 07/06/2020 7.8  % Final  . Eosinophils Relative 07/06/2020 3.8  % Final  . Basophils Relative 07/06/2020 0.4  % Final  . Neisseria Gonorrhea 07/06/2020 Negative   Final  . Chlamydia 07/06/2020 Negative   Final  . Comment 07/06/2020 Normal Reference Ranger Chlamydia - Negative   Final  . Comment 07/06/2020 Normal Reference Range Neisseria Gonorrhea - Negative   Final  Admission on 04/28/2020, Discharged on 04/29/2020  Component Date Value Ref Range Status  . Cholesterol 04/28/2020 177  0 - 200 mg/dL Final  . Triglycerides 04/28/2020 105  <150 mg/dL Final  . HDL 04/28/2020 55  >40 mg/dL Final  . Total CHOL/HDL Ratio 04/28/2020 3.2  RATIO Final  . VLDL 04/28/2020 21  0 - 40 mg/dL Final  . LDL Cholesterol 04/28/2020 101 (A) 0 - 99 mg/dL Final   Comment:        Total Cholesterol/HDL:CHD Risk Coronary Heart Disease Risk Table                     Men   Women  1/2 Average Risk   3.4   3.3  Average Risk       5.0   4.4  2 X Average Risk   9.6   7.1  3 X Average Risk  23.4   11.0        Use the calculated Patient Ratio above and the CHD Risk Table to determine the patient's CHD Risk.        ATP III CLASSIFICATION (LDL):  <100     mg/dL   Optimal  100-129  mg/dL   Near or Above                    Optimal  130-159  mg/dL   Borderline  160-189  mg/dL   High  >190     mg/dL   Very High Performed at [facility], [address], [place], [place] [address]   . Hgb A1c MFr Bld 04/28/2020 6.2 (A) 4.8 - 5.6 % Final   Comment: (NOTE) Pre  diabetes:          5.7%-6.4%  Diabetes:              >6.4%  Glycemic control for   <7.0% adults with diabetes   . Mean Plasma Glucose 04/28/2020 131.24  mg/dL Final   Performed at [facility] [address], [place], [place] [address]  . Hepatitis B Surface Ag 04/28/2020 Reactive (A) NON REACTIVE Final   Sample Positive for HBsAg. Result confirmed by neutralization.  Marland Kitchen HCV Ab 04/28/2020 NON REACTIVE  NON REACTIVE Final   Comment: (NOTE) Nonreactive HCV antibody screen is consistent with no HCV infections,  unless recent infection is suspected or other evidence exists to indicate HCV infection.    Marland Kitchen  Hep A IgM 04/28/2020 NON REACTIVE  NON REACTIVE Final  . Hep B C IgM 04/28/2020 NON REACTIVE  NON REACTIVE Final   Performed at [facility], [place] [address] [place], [place] [address]  Admission on 04/27/2020, Discharged on 04/27/2020  Component Date Value Ref Range Status  . SARS Coronavirus 2 by RT PCR 04/27/2020 NEGATIVE  NEGATIVE Final   Comment: (NOTE) SARS-CoV-2 target nucleic acids are NOT DETECTED.  The SARS-CoV-2 RNA is generally detectable in upper respiratory specimens during the acute phase of infection. The lowest concentration of SARS-CoV-2 viral copies this assay can detect is 138 copies/mL. A negative result does not preclude SARS-Cov-2 infection and should not be used as the sole basis for treatment or other patient management decisions. A negative result may occur with  improper specimen collection/handling, submission of specimen other than nasopharyngeal swab, presence of viral mutation(s) within the areas targeted by this assay, and inadequate number of viral copies(<138 copies/mL). A negative result must be combined with clinical observations, patient history, and epidemiological information. The expected result is Negative.  Fact Sheet for Patients:  EntrepreneurPulse.com.au  Fact Sheet for Healthcare Providers:   IncredibleEmployment.be  This test is no                          t yet approved or cleared by the [place] FDA and  has been authorized for detection and/or diagnosis of SARS-CoV-2 by FDA under an Emergency Use Authorization (EUA). This EUA will remain  in effect (meaning this test can be used) for the duration of the COVID-19 declaration under Section 564(b)(1) of the Act, 21 U.S.C.section 360bbb-3(b)(1), unless the authorization is terminated  or revoked sooner.      . Influenza A by PCR 04/27/2020 NEGATIVE  NEGATIVE Final  . Influenza B by PCR 04/27/2020 NEGATIVE  NEGATIVE Final   Comment: (NOTE) The Xpert Xpress SARS-CoV-2/FLU/RSV plus assay is intended as an aid in the diagnosis of influenza from Nasopharyngeal swab specimens and should not be used as a sole basis for treatment. Nasal washings and aspirates are unacceptable for Xpert Xpress SARS-CoV-2/FLU/RSV testing.  Fact Sheet for Patients: EntrepreneurPulse.com.au  Fact Sheet for Healthcare Providers: IncredibleEmployment.be  This test is not yet approved or cleared by the [place] FDA and has been authorized for detection and/or diagnosis of SARS-CoV-2 by FDA under an Emergency Use Authorization (EUA). This EUA will remain in effect (meaning this test can be used) for the duration of the COVID-19 declaration under Section 564(b)(1) of the Act, 21 U.S.C. section 360bbb-3(b)(1), unless the authorization is terminated or revoked.  Performed at [facility], [place] [address], [place],  [address]   . Sodium 04/27/2020 138  135 - 145 mmol/L Final  . Potassium 04/27/2020 4.1  3.5 - 5.1 mmol/L Final  . Chloride 04/27/2020 104  98 - 111 mmol/L Final  . CO2 04/27/2020 25  22 - 32 mmol/L Final  . Glucose, Bld 04/27/2020 115 (A) 70 - 99 mg/dL Final   Glucose reference range applies only to samples taken after fasting for at least 8  hours.  . BUN 04/27/2020 23 (A) 6 - 20 mg/dL Final  . Creatinine, Ser 04/27/2020 1.25 (A) 0.61 - 1.24 mg/dL Final  . Calcium 04/27/2020 9.8  8.9 - 10.3 mg/dL Final  . Total Protein 04/27/2020 8.0  6.5 - 8.1 g/dL Final  . Albumin 04/27/2020 4.7  3.5 - 5.0 g/dL Final  . AST  04/27/2020 383 (A) 15 - 41 U/L Final  . ALT 04/27/2020 119 (A) 0 - 44 U/L Final  . Alkaline Phosphatase 04/27/2020 86  38 - 126 U/L Final  . Total Bilirubin 04/27/2020 1.0  0.3 - 1.2 mg/dL Final  . GFR, Estimated 04/27/2020 >60  >60 mL/min Final   Comment: (NOTE) Calculated using the CKD-EPI Creatinine Equation (2021)   . Anion gap 04/27/2020 9  5 - 15 Final   Performed at [facility], [place] [address], [place], [place] [address]  . Alcohol, Ethyl (B) 04/27/2020 <10  <10 mg/dL Final   Comment: (NOTE) Lowest detectable limit for serum alcohol is 10 mg/dL.  For medical purposes only. Performed at [facility], [place] [address], [place], [place] [address]   . Opiates 04/27/2020 NONE DETECTED  NONE DETECTED Final  . Cocaine 04/27/2020 POSITIVE (A) NONE DETECTED Final  . Benzodiazepines 04/27/2020 NONE DETECTED  NONE DETECTED Final  . Amphetamines 04/27/2020 NONE DETECTED  NONE DETECTED Final  . Tetrahydrocannabinol 04/27/2020 NONE DETECTED  NONE DETECTED Final  . Barbiturates 04/27/2020 NONE DETECTED  NONE DETECTED Final   Comment: (NOTE) DRUG SCREEN FOR MEDICAL PURPOSES ONLY.  IF CONFIRMATION IS NEEDED FOR ANY PURPOSE, NOTIFY LAB WITHIN 5 DAYS.  LOWEST DETECTABLE LIMITS FOR URINE DRUG SCREEN Drug Class                     Cutoff (ng/mL) Amphetamine and metabolites    1000 Barbiturate and metabolites    200 Benzodiazepine                 [id number] Tricyclics and metabolites     300 Opiates and metabolites        300 Cocaine and metabolites        300 THC                            50 Performed at [facility], [place] [address] [place], [place] [address]   .  WBC 04/27/2020 13.5 (A) 4.0 - 10.5 K/uL Final  . RBC 04/27/2020 4.69  4.22 - 5.81 MIL/uL Final  . Hemoglobin 04/27/2020 14.6  13.0 - 17.0 g/dL Final  . HCT 04/27/2020 42.8  39.0 - 52.0 % Final  . MCV 04/27/2020 91.3  80.0 - 100.0 fL Final  . MCH 04/27/2020 31.1  26.0 - 34.0 pg Final  . MCHC 04/27/2020 34.1  30.0 - 36.0 g/dL Final  . RDW 04/27/2020 13.1  11.5 - 15.5 % Final  . Platelets 04/27/2020 195  150 - 400 K/uL Final  . nRBC 04/27/2020 0.0  0.0 - 0.2 % Final  . Neutrophils Relative % 04/27/2020 77  % Final  . Neutro Abs 04/27/2020 10.4 (A) 1.7 - 7.7 K/uL Final  . Lymphocytes Relative 04/27/2020 15  % Final  . Lymphs Abs 04/27/2020 2.0  0.7 - 4.0 K/uL Final  . Monocytes Relative 04/27/2020 8  % Final  . Monocytes Absolute 04/27/2020 1.0  0.1 - 1.0 K/uL Final  . Eosinophils Relative 04/27/2020 0  % Final  . Eosinophils Absolute 04/27/2020 0.0  0.0 - 0.5 K/uL Final  . Basophils Relative 04/27/2020 0  % Final  . Basophils Absolute 04/27/2020 0.0  0.0 - 0.1 K/uL Final  . Immature Granulocytes 04/27/2020 0  % Final  . Abs Immature Granulocytes 04/27/2020 0.04  0.00 - 0.07 K/uL Final   Performed at [facility],  [place] [address] [place], [place] [address]  . Salicylate Lvl 99991111 <7.0 (A) 7.0 - 30.0 mg/dL Final   Performed at [place] [address], [place], [place] [address]  . Acetaminophen (Tylenol), Serum 04/27/2020 <10 (A) 10 - 30 ug/mL Final   Comment: (NOTE) Therapeutic concentrations vary significantly. A range of 10-30 ug/mL  may be an effective concentration for many patients. However, some  are best treated at concentrations outside of this range. Acetaminophen concentrations >150 ug/mL at 4 hours after ingestion  and >50 ug/mL at 12 hours after ingestion are often associated with  toxic reactions.  Performed at [facility], [place] [address], [place], [place] [address]     Allergies: Penicillin g,  Penicillins, Latex, and Tape  PTA Medications:  Current Outpatient Medications  Medication Instructions  . bictegravir-emtricitabine-tenofovir AF (BIKTARVY) 50-200-25 MG TABS tablet 1 tablet, Oral, Daily  . DULoxetine (CYMBALTA) 60 mg, Oral, Daily, For neuropathy  . gabapentin (NEURONTIN) 300 MG capsule Take orally 2 caps ('600mg'$ ) in the morning and 3 caps ('900mg'$ ) in the evening  . hydrOXYzine (ATARAX/VISTARIL) 25 mg, Oral, 3 times daily PRN  . meloxicam (MOBIC) 7.5 mg, Oral, Daily  . QUEtiapine (SEROQUEL) 200 mg, Oral, Daily at bedtime  . rosuvastatin (CRESTOR) 20 mg, Oral, Daily  . sildenafil (VIAGRA) 50 mg, Oral, Daily PRN, At least 24 hours between doses  . traZODone (DESYREL) 150 mg, Oral, Daily at bedtime     Medical Decision Making  Admit to continuous assessment for crisis stabilization  Continue home medications: . bictegravir-emtricitabine-tenofovir AF  1 tablet Oral Daily  . DULoxetine  60 mg Oral Daily  . gabapentin  600 mg Oral BID  . meloxicam  7.5 mg Oral Daily  . QUEtiapine  200 mg Oral QHS  . rosuvastatin  20 mg Oral Daily  . traZODone  150 mg Oral QHS   PRN Meds:.acetaminophen, alum & mag hydroxide-simeth, hydrOXYzine, magnesium hydroxide   Lab Orders         Resp Panel by RT-PCR (Flu A&B, Covid) Nasopharyngeal Swab         CBC with Differential/Platelet         Comprehensive metabolic panel         Hemoglobin A1c         Ethanol         Lipid panel         TSH         POC SARS Coronavirus 2 Ag-ED - Nasal Swab         POCT Urine Drug Screen - (ICup)      Clinical Course as of 10/26/20 0550  Tue Oct 26, 2020  0244 Lipid panel Lipid Panel unremarkable [JB]  0244 Hemoglobin A1C(!): 6.0 Slightly elevated, consistent with previous results [JB]  0244 Alcohol, Ethyl (B): <10 [JB]  0245 Glucose(!): 151 Glucose 151-CMP otherwise unremarkable [JB]  0245 TSH: 0.512 [JB]  0245 SARS Coronavirus 2 by RT PCR: NEGATIVE [JB]  0245 CBC with  Differential/Platelet CBC unremarkable [JB]  0549 EKG 12-Lead EKG similar to EKG from 08/24/2020  Vent. rate 67 BPM PR interval 168 ms QRS duration 102 ms QT/QTcB 440/464 ms P-R-T axes 52 53 42 Sinus rhythm with Premature atrial complexes with Abberant conduction Minimal voltage criteria for LVH, may be normal variant ( Sokolow-Lyon ) Borderline ECG [JB]    Clinical Course User Index [JB] Rozetta Nunnery, NP    Recommendations  Based on my evaluation the patient  does not appear to have an emergency medical condition.  Rozetta Nunnery, NP 10/26/20  12:30 AM

## 2020-10-26 NOTE — ED Notes (Signed)
Patient received asleep in bed without distress or complaint.  Will continue to monitor and provide safe environment.

## 2020-10-26 NOTE — Progress Notes (Signed)
Patient asleep all day without complaint.  Ate a small amount earlier.  Patient is now being transferred to the [facility] for further treatment and evaluation.

## 2020-10-26 NOTE — ED Notes (Signed)
Pt resting in bed. A&O x4, calm and cooperative. Pt denies current SI/HI/AVH. Pt denies any current needs. No signs of acute distress noted. Will continue to monitor for safety.

## 2020-10-26 NOTE — ED Provider Notes (Signed)
   Dylan [name] 55 y.o. male admitted to [facility] for safety and stabilization after presenting with complaints of suicidal ideation with a plan to jump off of a parking day.    Dylan [name], 55 y.o., male patient seen face to face by this provider, consulted with Dr. Ernie Hew and chart reviewed on 10/26/20.  On evaluation Dylan [name] reports hat he is not feeling well and reports that he came to urgent care because he was having thoughts of jumping off a parking deck that was stopped when police showed.  Patient denies any outpatient psychiatric services at this time and reports he has not taken any psychotropic medication for 5 days prior to coming to urgent care. Patient continues to endorse suicidal ideation with intent and plan.  Patient would not elaborate on questions or answer questions dealing with auditory/visual hallucinations or paranoia.  Patient has not provided a urine sample for urine drug screen or urinalysis. During evaluation Dylan [name] is laying in bed in and out of sleep in no acute distress.  He is oriented x 4 and calm.  His mood is irritable and dysphoric with congruent affect.  He has normal speech, and behavior.  Objectively there is no evidence of psychosis/mania or delusional thinking; but patient states he is having auditory/visual hallucinations but will not elaborate.  He is able to converse coherently, goal directed thoughts, no distractibility, or pre-occupation when awake     Medications have been restarted  bictegravir-emtricitabine-tenofovir AF  1 tablet Oral Daily   DULoxetine  60 mg Oral Daily   gabapentin  600 mg Oral BID   meloxicam  7.5 mg Oral Daily   QUEtiapine  200 mg Oral QHS   rosuvastatin  20 mg Oral Daily   traZODone  150 mg Oral QHS    Labs reviewed Lab Orders         Resp Panel by RT-PCR (Flu A&B, Covid) Nasopharyngeal Swab         CBC with Differential/Platelet         Comprehensive metabolic panel         Hemoglobin A1c          Ethanol         Lipid panel         TSH         POC SARS Coronavirus 2 Ag-ED - Nasal Swab         POCT Urine Drug Screen - (ICup)      Waiting for patient to provide urine sample for urinalysis and UDS  Recommendation:   Based on my evaluation the patient does not appear to have an emergency medical condition  Admit to facility based crisis.  Patient continues to endorse suicidal ideation and not contracting for safety.

## 2020-10-26 NOTE — ED Notes (Signed)
Pt A&O x 4, presents with suicidal ideations, plan to jump off parking deck.  Pt brought in by police.  REcent stressors,  my daughter passed away 2 years ago and my mom passed away 1 year ago.  Pt calm & cooperative, no distress noted. Skin search completed, MOnitoring for safety.

## 2020-10-26 NOTE — ED Notes (Signed)
Pt did not attend AA/NA group.

## 2020-10-26 NOTE — ED Notes (Signed)
Pt sleeping@this time. Breathing even and unlabored. Will continue to monitor for safety 

## 2020-10-26 NOTE — Discharge Instructions (Addendum)

## 2020-10-26 NOTE — ED Notes (Signed)
Pt transferred from [facility] endorsing SI currently, no plan. Pt states, "I'm tired of going thru this emotional pain. I'm alone in this world. My daughter died 2 years ago and my mother died a year ago. I have nobody". Support provided. Pt cooperative throughout interview process. Skin assessment completed. Oriented to unit. Meal and drink offered. At [facility], pt continue to endorse SI but able to contract for safety. Will monitor for safety.

## 2020-10-26 NOTE — ED Provider Notes (Signed)
[facility] Admission Suicide Risk Assessment   Nursing information obtained from:   Patient Demographic factors:   [facility] Current Mental Status:   Alert and oriented Loss Factors:   N/A Historical Factors:   polysubstance abuse, schizoaffective disorder Risk Reduction Factors:   Suicidal ideation with plan and intent  Total Time spent with patient: 30 minutes Principal Problem: Schizoaffective disorder, bipolar type ([place]) Diagnosis:  Principal Problem:   Schizoaffective disorder, bipolar type ([place]) Active Problems:   Substance induced mood disorder ([place])  Subjective Data: Patient states he continues to be suicidal with plan to jump off parking deck  [name] 55 y.o. male admitted to [facility] for safety and stabilization after presenting with complaints of suicidal ideation with a plan to jump off of a parking day.  [name], 55 y.o., male patient seen face to face by this provider, consulted with Dr. Ernie Hew and chart reviewed on 10/26/20.  On evaluation Deandrea Baria reports hat he is not feeling well and reports that he came to urgent care because he was having thoughts of jumping off a parking deck that was stopped when police showed.  Patient denies any outpatient psychiatric services at this time and reports he has not taken any psychotropic medication for 5 days prior to coming to urgent care. Patient continues to endorse suicidal ideation with intent and plan.  Patient would not elaborate on questions or answer questions dealing with auditory/visual hallucinations or paranoia.  Patient has not provided a urine sample for urine drug screen or urinalysis. During evaluation Emrik Plyler is laying in bed in and out of sleep in no acute distress.  He is oriented x 4 and calm.  His mood is irritable and dysphoric with congruent affect.  He has normal speech, and behavior.  Objectively there is no evidence of psychosis/mania or delusional thinking; but patient states he is  having auditory/visual hallucinations but will not elaborate.  He is able to converse coherently, goal directed thoughts, no distractibility, or pre-occupation when awake    Continued Clinical Symptoms:    The "Alcohol Use Disorders Identification Test", Guidelines for Use in Primary Care, Second Edition.  World Pharmacologist [facility]). Score between 0-7:  no or low risk or alcohol related problems. Score between 8-15:  moderate risk of alcohol related problems. Score between 16-19:  high risk of alcohol related problems. Score 20 or above:  warrants further diagnostic evaluation for alcohol dependence and treatment.   CLINICAL FACTORS:   Depression:   Severe Alcohol/Substance Abuse/Dependencies Previous Psychiatric Diagnoses and Treatments   Musculoskeletal: Strength & Muscle Tone: within normal limits Gait & Station: normal Patient leans: N/A  Psychiatric Specialty Exam:  Presentation  General Appearance: Appropriate for Environment  Eye Contact:None  Speech:Clear and Coherent  Speech Volume:Normal  Handedness:Right   Mood and Affect  Mood:Irritable  Affect:Congruent   Thought Process  Thought Processes:Coherent  Descriptions of Associations:Intact  Orientation:Full (Time, Place and Person)  Thought Content:WDL  History of Schizophrenia/Schizoaffective disorder:Yes  Duration of Psychotic Symptoms:Greater than six months  Hallucinations:Hallucinations: None Description of Auditory Hallucinations: does not want to discuss Description of Visual Hallucinations: shadow figures  Ideas of Reference:None  Suicidal Thoughts:Suicidal Thoughts: Yes, Active SI Active Intent and/or Plan: With Intent; With Plan (Patient states that he was going to jump off parking deck but police came)  Homicidal Thoughts:Homicidal Thoughts: No   Sensorium  Memory:Immediate Good; Recent Good  Judgment:Intact  Insight:Fair   Executive Functions   Concentration:Fair  Attention Span:Fair  Recall:Fair;  [place]; Good  Language:Fair; Good   Psychomotor Activity  Psychomotor Activity:Psychomotor Activity: Normal   Assets  Assets:Communication Skills; Desire for Improvement   Sleep  Sleep:Sleep: Good    Physical Exam: Physical Exam Constitutional:      General: He is not in acute distress.    Appearance: He is not ill-appearing, toxic-appearing or diaphoretic.  HENT:     Head: Normocephalic.     Right Ear: External ear normal.     Left Ear: External ear normal.  Eyes:     Pupils: Pupils are equal, round, and reactive to light.  Cardiovascular:     Rate and Rhythm: Normal rate.  Pulmonary:     Effort: Pulmonary effort is normal. No respiratory distress.  Musculoskeletal:        General: Normal range of motion.  Skin:    General: Skin is warm and dry.  Neurological:     Mental Status: He is alert and oriented to person, place, and time.  Psychiatric:        Mood and Affect: Mood is anxious and depressed.        Speech: Speech normal.        Behavior: Behavior is cooperative.        Thought Content: Thought content is paranoid. Thought content includes suicidal ideation. Thought content does not include homicidal ideation. Thought content includes suicidal plan.   Review of Systems  Constitutional:  Negative for chills, diaphoresis, fever, malaise/fatigue and weight loss.  HENT:  Negative for congestion.   Respiratory:  Negative for cough and shortness of breath.   Cardiovascular:  Negative for chest pain and palpitations.  Gastrointestinal:  Negative for diarrhea, nausea and vomiting.  Neurological:  Negative for dizziness and seizures.  Psychiatric/Behavioral:  Positive for depression and suicidal ideas. Negative for hallucinations, memory loss and substance abuse. The patient has insomnia. The patient is not nervous/anxious.   All other systems reviewed and are negative. Blood  pressure 119/86, pulse 64, temperature 98.4 F (36.9 C), temperature source Oral, resp. rate 18, SpO2 98 %. There is no height or weight on file to calculate BMI.   COGNITIVE FEATURES THAT CONTRIBUTE TO RISK:  None    SUICIDE RISK:   Moderate:  Frequent suicidal ideation with limited intensity, and duration, some specificity in terms of plans, no associated intent, good self-control, limited dysphoria/symptomatology, some risk factors present, and identifiable protective factors, including available and accessible social support.  PLAN OF CARE: Admit to [facility] for safety and stabilization  I certify that inpatient services furnished can reasonably be expected to improve the patient's condition.   Aundra Espin, NP 10/26/2020, 4:20 PM

## 2020-10-27 DIAGNOSIS — F411 Generalized anxiety disorder: Secondary | ICD-10-CM | POA: Diagnosis not present

## 2020-10-27 DIAGNOSIS — F251 Schizoaffective disorder, depressive type: Secondary | ICD-10-CM | POA: Diagnosis not present

## 2020-10-27 DIAGNOSIS — F1994 Other psychoactive substance use, unspecified with psychoactive substance-induced mood disorder: Secondary | ICD-10-CM | POA: Diagnosis not present

## 2020-10-27 DIAGNOSIS — F5105 Insomnia due to other mental disorder: Secondary | ICD-10-CM | POA: Diagnosis not present

## 2020-10-27 MED ORDER — NICOTINE 14 MG/24HR TD PT24
14.0000 mg | MEDICATED_PATCH | Freq: Every day | TRANSDERMAL | Status: DC
  Filled 2020-10-27 (×2): qty 1

## 2020-10-27 NOTE — Group Note (Signed)
Group Topic: Wellness  Group Date: 10/27/2020 Start Time: 1000 End Time: [place] Facilitators: Laury Axon E  Department: [facility]  Number of Participants: 5  Group Focus: personal responsibility Treatment Modality:  Behavior Modification Therapy Interventions utilized were assignment, patient education, and problem solving Purpose: enhance coping skills and reinforce self-care  Name: Dylan [name] Date of Birth: 12-17-1965  MR: [id number]    Level of Participation: minimal Quality of Participation: cooperative Interactions with others: n/a Mood/Affect: appropriate Triggers (if applicable): n/a Cognition: coherent/clear Progress: Minimal Response: n/a Plan: follow-up needed  Patients Problems:  Patient Active Problem List   Diagnosis Date Noted   Substance induced mood disorder ([place]) 10/26/2020   Bipolar affective disorder, mixed, severe, with psychotic behavior ([place]) 04/28/2020   Fatty liver 03/09/2020   Schizophrenia ([place]) 03/08/2020   Generalized anxiety disorder 02/03/2020   Insomnia due to other mental disorder 02/03/2020   Lisfranc dislocation, right, initial encounter    Prediabetes 07/22/2019   Tobacco abuse 07/10/2018   ESRD needing dialysis ([place]) 03/03/2018   Schizoaffective disorder, bipolar type ([place])    Acute renal failure (ARF) ([place])    Suicidal ideations    Chronic 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 coma ([place])    AKI (acute kidney injury) ([place]) 02/19/2017   Rhabdomyolysis 02/19/2017   Suicidal overdose ([place]) 02/19/2017   Acute hepatitis 02/19/2017   Elevated LFTs    Schizoaffective disorder, depressive type ([place]) 09/05/2011   Degenerative disc disease 08/21/2011   HIV disease ([place]) 08/17/2011

## 2020-10-27 NOTE — ED Notes (Signed)
Pt attended wrap up group 

## 2020-10-27 NOTE — ED Notes (Signed)
Pt asleep in bed. Respirations even and unlabored. Will continue to monitor for safety. ?

## 2020-10-27 NOTE — ED Provider Notes (Signed)
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 Final   Potassium 07/06/2020 4.3  3.5 - 5.3 mmol/L Final   Chloride 07/06/2020 107  98 - 110 mmol/L Final   CO2 07/06/2020 24  20 - 32 mmol/L Final   Calcium 07/06/2020 9.2  8.6 - 10.3 mg/dL Final   Total Protein 07/06/2020 6.8  6.1 - 8.1 g/dL Final   Albumin 07/06/2020 4.1  3.6 - 5.1 g/dL Final   Globulin 07/06/2020 2.7  1.9 - 3.7 g/dL (calc) Final   AG Ratio 07/06/2020 1.5  1.0 - 2.5 (calc) Final   Total Bilirubin  07/06/2020 0.4  0.2 - 1.2 mg/dL Final   Alkaline phosphatase (APISO) 07/06/2020 87  35 - 144 U/L Final   AST 07/06/2020 32  10 - 35 U/L Final   ALT 07/06/2020 33  9 - 46 U/L Final   WBC 07/06/2020 4.5  3.8 - 10.8 Thousand/uL Final   RBC 07/06/2020 4.50  4.20 - 5.80 Million/uL Final   Hemoglobin 07/06/2020 13.7  13.2 - 17.1 g/dL Final   HCT 07/06/2020 41.0  38.5 - 50.0 % Final   MCV 07/06/2020 91.1  80.0 - 100.0 fL Final   MCH 07/06/2020 30.4  27.0 - 33.0 pg Final   MCHC 07/06/2020 33.4  32.0 - 36.0 g/dL Final   RDW 07/06/2020 12.9  11.0 - 15.0 % Final   Platelets 07/06/2020 163  140 - 400 Thousand/uL Final   MPV 07/06/2020 11.7  7.5 - 12.5 fL Final   Neutro Abs 07/06/2020 2,183  1,500 - 7,800 cells/uL Final   Lymphs Abs 07/06/2020 1,778  850 - 3,900 cells/uL Final   Absolute Monocytes 07/06/2020 351  200 - 950 cells/uL Final   Eosinophils Absolute 07/06/2020 171  15 - 500 cells/uL Final   Basophils Absolute 07/06/2020 18  0 - 200 cells/uL Final   Neutrophils Relative % 07/06/2020 48.5  % Final   Total Lymphocyte 07/06/2020 39.5  % Final   Monocytes Relative 07/06/2020 7.8  % Final   Eosinophils Relative 07/06/2020 3.8  % Final   Basophils Relative 07/06/2020 0.4  % Final   Neisseria Gonorrhea 07/06/2020 Negative   Final   Chlamydia 07/06/2020 Negative   Final   Comment 07/06/2020 Normal Reference [place] Chlamydia - Negative   Final   Comment 07/06/2020 Normal Reference Range Neisseria Gonorrhea - Negative   Final  Admission on 04/28/2020, Discharged on 04/29/2020  Component Date Value Ref Range Status   Cholesterol 04/28/2020 177  0 - 200 mg/dL Final   Triglycerides 04/28/2020 105  <150 mg/dL Final   HDL 04/28/2020 55  >40 mg/dL Final   Total CHOL/HDL Ratio 04/28/2020 3.2  RATIO Final   VLDL 04/28/2020 21  0 - 40 mg/dL Final   LDL Cholesterol 04/28/2020 101 (A) 0 - 99 mg/dL Final   Comment:        Total Cholesterol/HDL:CHD Risk Coronary Heart Disease Risk Table                      Men   Women  1/2 Average Risk   3.4   3.3  Average Risk       5.0   4.4  2 X Average Risk   9.6   7.1  3 X Average Risk  23.4   11.0        Use the calculated Patient Ratio above and the CHD Risk Table to determine the patient's CHD Risk.  ATP III CLASSIFICATION (LDL):  <100     mg/dL   Optimal  100-129  mg/dL   Near or Above                    Optimal  130-159  mg/dL   Borderline  160-189  mg/dL   High  >190     mg/dL   Very High Performed at [facility], [place], [name] [address]    Hgb A1c MFr Bld 04/28/2020 6.2 (A) 4.8 - 5.6 % Final   Comment: (NOTE) Pre diabetes:          5.7%-6.4%  Diabetes:              >6.4%  Glycemic control for   <7.0% adults with diabetes    Mean Plasma Glucose 04/28/2020 131.24  mg/dL Final   Performed at [place] [facility] [address] [place], [place] [address]   Hepatitis B Surface Ag 04/28/2020 Reactive (A) NON REACTIVE Final   Sample Positive for HBsAg. Result confirmed by neutralization.   HCV Ab 04/28/2020 NON REACTIVE  NON REACTIVE Final   Comment: (NOTE) Nonreactive HCV antibody screen is consistent with no HCV infections,  unless recent infection is suspected or other evidence exists to indicate HCV infection.     Hep A IgM 04/28/2020 NON REACTIVE  NON REACTIVE Final   Hep B C IgM 04/28/2020 NON REACTIVE  NON REACTIVE Final   Performed at [facility], [place] [address] [place],  [address]    Blood Alcohol level:  Lab Results  Component Value Date   ETH <10 10/26/2020   ETH <10 [id number]    Metabolic Disorder Labs: Lab Results  Component Value Date   HGBA1C 6.0 (H) 10/26/2020   MPG 125.5 10/26/2020   MPG 131.24 04/28/2020   Lab Results  Component Value Date   PROLACTIN 12.1 03/08/2020   PROLACTIN 2.6 (L) 11/09/2019   Lab Results  Component Value Date   CHOL 153 10/26/2020   TRIG 80 10/26/2020   HDL 43 10/26/2020   CHOLHDL 3.6 10/26/2020   VLDL 16 10/26/2020    LDLCALC 94 10/26/2020   LDLCALC 101 (H) 04/28/2020    Therapeutic Lab Levels: No results found for: LITHIUM No results found for: VALPROATE No components found for:  CBMZ  Physical Findings   AIMS    Flowsheet Row Admission (Discharged) from 04/28/2020 in [place] Admission (Discharged) from 03/07/2020 in [place] [facility]B  AIMS Total Score 0 0      AUDIT    Flowsheet Row Admission (Discharged) from 04/28/2020 in [place] Admission (Discharged) from 03/07/2020 in [place] [facility] Admission (Discharged) from 12/12/2011 in [place] [facility]  Alcohol Use Disorder Identification Test Final Score (AUDIT) '3 13 8      '$ GAD-7    Flowsheet Row Office Visit from 03/17/2020 in [place] Office Visit from 07/22/2019 in [place] Office Visit from 09/03/2018 in [place]  Total GAD-7 Score '16 19 19      '$ PHQ2-9    [place] ED from 10/25/2020 in [facility] Video Visit from 04/05/2020 in [facility] Office Visit from 03/17/2020 in [place] ED from 11/09/2019 in [facility] Office Visit from  07/22/2019 in [place]  PHQ-2 Total Score 5 0 '1 6 4  '$ PHQ-9 Total Score 15 -- '9 19 15      '$ Flowsheet Row ED from 10/25/2020 in [facility] ED from 08/24/2020 in [place] DEPT Admission (Discharged) from 04/28/2020 in [place] CATEGORY Low Risk High Risk Moderate Risk        Musculoskeletal  Strength & Muscle Tone: within normal limits Gait & Station: normal Patient leans: N/A  Psychiatric Specialty Exam  Presentation   General Appearance: Appropriate for Environment; Casual  Eye Contact:Good  Speech:Clear and Coherent; Normal Rate  Speech Volume:Normal  Handedness:Right   Mood and Affect  Mood:-- ("so-so")  Affect:Appropriate; Congruent; Constricted (dysphoric)   Thought Process  Thought Processes:Coherent; Goal Directed; Linear  Descriptions of Associations:Intact  Orientation:Full (Time, Place and Person)  Thought Content:WDL; Logical  Diagnosis of Schizophrenia or Schizoaffective disorder in past: Yes  Duration of Psychotic Symptoms: Greater than six months   Hallucinations:Hallucinations: None Description of Auditory Hallucinations: does not want to discuss Description of Visual Hallucinations: shadow figures  Ideas of Reference:None  Suicidal Thoughts:Suicidal Thoughts: Yes, Passive ("less thoughts")   Homicidal Thoughts:Homicidal Thoughts: No   Sensorium  Memory:Immediate Good; Recent Good; Remote Good  Judgment:Fair  Insight:Fair   Executive Functions  Concentration:Good  Attention Span:Good  [place] of Knowledge:Good  Language:Good   Psychomotor Activity  Psychomotor Activity:Psychomotor Activity: Normal   Assets  Assets:Communication Skills; Desire for Improvement; Housing; Resilience   Sleep  Sleep:Sleep: Good   Nutritional Assessment (For OBS and FBC admissions only) Has the patient had a weight loss or gain of 10 pounds or more in the last 3 months?: No Has the patient had a decrease in food intake/or appetite?: No Does the patient have dental problems?: No Does the patient have eating habits or behaviors that may be indicators of an eating disorder including binging or inducing vomiting?: No Has the patient recently lost weight without trying?: 0 Has the patient been eating poorly because of a decreased appetite?: 0 Malnutrition Screening Tool Score: 0    Physical Exam  Physical Exam Constitutional:      Appearance:  Normal appearance. He is normal weight.  HENT:     Head: Normocephalic and atraumatic.  Eyes:     Extraocular Movements: Extraocular movements intact.  Pulmonary:     Effort: Pulmonary effort is normal.  Neurological:     General: No focal deficit present.     Mental Status: He is alert and oriented to person, place, and time.  Psychiatric:        Attention and Perception: Attention and perception normal.        Speech: Speech normal.        Behavior: Behavior normal. Behavior is cooperative.        Thought Content: Thought content normal.   Review of Systems  Constitutional:  Negative for chills and fever.  HENT:  Negative for hearing loss.   Eyes:  Negative for discharge and redness.  Respiratory:  Negative for cough.   Cardiovascular:  Negative for chest pain.  Gastrointestinal:  Negative for abdominal pain.  Musculoskeletal:  Negative for myalgias.  Neurological:  Negative for headaches.  Psychiatric/Behavioral:  Positive for depression, substance abuse and suicidal ideas. Negative for hallucinations.   Blood pressure 107/70, pulse 60, temperature 98.4 F (36.9 C), temperature source Oral, resp. rate 18, SpO2 97 %. There is no height  or weight on file to calculate BMI.  Treatment Plan Summary: 55 year old man with a history of schizoaffective disorder, depressive type and substance use who presented to the [facility] for suicidal thoughts and plan to jump from a parking garage on 10/18..  Patient was admitted to the [facility] for treatment on 10/18.  UDS+cocaine and benzodiazepines. Patient was restarted on his home medications upon admission to the [facility] which were continued at the [facility].  Patient indicates that he continues to have passive SI today does not feel safe outside the hospital setting.  Patient remains appropriate for treatment at the [facility] at this time.   Schizoaffective disorder, depressive type -continue seroquel 200 mg qhs -continue cymbalta 60 mg daily -cotninue trazodone 150  gm qhs prn  Polysubstance abuse -UDS+cocaine and BZD -pt expresses interest in IOP for SA  Neuropathy -cotninute gabapentin 600 mg BID -cymbalta also has pain properties to assist with neuropathic pain -mobic 7.5 mg daily  HIV -continue biktarvy  HLD -continue crestor 20 mg  Dispo: Ongoing.  Patient expresses interest in intensive outpatient substance use programs; however, is not currently interested in residential treatment programs.  Social work assisting with disposition.   Ival Bible, MD 10/27/2020 11:43 AM

## 2020-10-27 NOTE — ED Notes (Signed)
Pt is in the dining room eating a snack watching television.

## 2020-10-27 NOTE — Clinical Social Work Psych Note (Signed)
Cognitive Distortions & Restructuring  Cognitive Distortions & Cognitive Restructuring (CBT & DBT Skills)  Date: 10/27/20  Type of Therapy/Therapeutic Modalities:  Participation Level: Did Not Attend  Objective: The purpose of this group is to discuss and assist patients in identifying cognitive distortions (negative thinking patterns) that can influence their emotions and behaviors. Facilitators will guide conversations that discuss how these cognitive distortions contribute to common disorders such as anxiety and depression.   Therapeutic Goals:  Patient will identify negative thinking patterns they currently experience and how those thoughts influence their behaviors.  Patient will begin to explore the possible misinformation and/or traumatic experiences that influence their negative thought patterns.  Patient will explore the foundations and techniques of cognitive restructuring by reviewing CBT and DBT techniques.  Patient will discuss the   Summary of Patient's Progress:    Dylan [name] did not participate in the group session.

## 2020-10-27 NOTE — Progress Notes (Signed)
Pt is awake, alert and oriented. Pt endorses passive SI. Pt stated " I always think about it." Denies any plan or intent. Pt verbally contracts for safety on the unit. Administered scheduled meds with no incident. Pt denies pain and current HI/AVH. Staff will monitor for pt's safety.

## 2020-10-27 NOTE — ED Notes (Signed)
Snacks given 
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
## 2020-10-27 NOTE — Progress Notes (Signed)
Pt is currently asleep. Respirations are even and unlabored. No signs of acute distress noted. Monitoring for pt's safety.

## 2020-10-28 DIAGNOSIS — F411 Generalized anxiety disorder: Secondary | ICD-10-CM | POA: Diagnosis not present

## 2020-10-28 DIAGNOSIS — F5105 Insomnia due to other mental disorder: Secondary | ICD-10-CM | POA: Diagnosis not present

## 2020-10-28 DIAGNOSIS — F1994 Other psychoactive substance use, unspecified with psychoactive substance-induced mood disorder: Secondary | ICD-10-CM | POA: Diagnosis not present

## 2020-10-28 DIAGNOSIS — F251 Schizoaffective disorder, depressive type: Secondary | ICD-10-CM | POA: Diagnosis not present

## 2020-10-28 MED ORDER — QUETIAPINE FUMARATE 50 MG PO TABS
250.0000 mg | ORAL_TABLET | Freq: Every day | ORAL | Status: DC
  Administered 2020-10-28 – 2020-10-30 (×3): 250 mg via ORAL
  Filled 2020-10-28 (×3): qty 1

## 2020-10-28 MED ORDER — MENTHOL 3 MG MT LOZG
1.0000 | LOZENGE | OROMUCOSAL | Status: DC | PRN
  Administered 2020-10-28 – 2020-10-30 (×4): 3 mg via ORAL
  Filled 2020-10-28 (×2): qty 9
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
## 2020-10-28 NOTE — Progress Notes (Signed)
Patient in dining room having breakfast.  Social, pleasant.

## 2020-10-28 NOTE — ED Notes (Signed)
PT is in bed sleeping, no distress noted, will continue to monitor patient for safety

## 2020-10-28 NOTE — Progress Notes (Signed)
Patient resting on bed.  Respirations even and unlabored.  Continue to monitor for safety.

## 2020-10-28 NOTE — ED Provider Notes (Signed)
Behavioral Health Progress Note  Date and Time: 10/28/2020 2:06 PM Name: Kingsley Sklar MRN:  [id number]  Subjective: Patient chart reviewed-patient has been medication compliant, has been attending some groups, has been appropriate with staff and peers on the unit.    Patient seen this morning.  Patient reports that he is currently.  experiencing a sore throat and requests medication for assistance.  Patient denies congestion, shortness of breath, fever, cough.  Patient states that he had been around somebody who had a cold prior to presentation to the hospital.  Patient describes his mood is "unsure".  He rates it a 3 out of 10 (10 being the best).  Patient states that he was experiencing auditory hallucinations last night that were instructing him to harm himself or other people.  Patient declines to provide further details of the hallucinations at this time; however, patient denies SI/HI/AVH present.  Patient does not appear objectively psychotic. Discussed with patient about increasing Seroquel dose to assist with hallucinations-patient is amenable.  Patient states that he would be interested in possibly going to an [facility] at discharge instead of returning to his apartment due to people being in his apartment that he does not want there.  Patient is still unsure if he will take legal action against these individuals ; such as, calling  the police to help remove him from the premises as they are not on the lease and have no legal standing to be there  Patient states that he plans on calling his case manager today to discuss housing options.  Patient states he would prefer to go directly from the [facility] to an [facility] if possible.   Diagnosis:  Final diagnoses:  Schizoaffective disorder, depressive type ([place])  Generalized anxiety disorder  Insomnia due to other mental disorder    Total Time spent with patient: 20 minutes  Past Psychiatric History: as per H&P Past Medical History:  Past  Medical History:  Diagnosis Date   Anxiety    Arthritis    Bipolar 1 disorder ([place])    Colon polyps    Depression    GERD (gastroesophageal reflux disease)    Hepatitis B    Human immunodeficiency virus (HIV) ([place])    Hyperlipidemia    Hypertension    Neuromuscular disorder ([place])    neuropathy   Neuropathy    Pre-diabetes    Schizophrenia ([place])     Past Surgical History:  Procedure Laterality Date   FOOT ARTHRODESIS Right 09/17/2019   Procedure: FUSION RIGHT LISFRANC JOINT;  Surgeon: Newt Minion, MD;  Location: [facility];  Service: Orthopedics;  Laterality: Right;   FOOT ARTHRODESIS Right 09/2019   HEMORROIDECTOMY     IR FLUORO GUIDE CV LINE RIGHT  02/22/2017   IR US GUIDE VASC ACCESS RIGHT  02/22/2017   TUMOR REMOVAL     From Chest   Family History:  Family History  Problem Relation Age of Onset   CAD Mother    Diabetes Father    Colon cancer Maternal Aunt 60   Diabetes Maternal Aunt    Heart disease Maternal Uncle    Esophageal cancer Neg Hx    Rectal cancer Neg Hx    Stomach cancer Neg Hx    Family Psychiatric  History: as per H&P Social History:  Social History   Substance and Sexual Activity  Alcohol Use Yes   Comment: occassional use on weekends     Social History   Substance and Sexual Activity  Drug Use  Yes   Types: Cocaine, Marijuana   Comment: last use was Friday 03/05/2020    Social History   Socioeconomic History   Marital status: Married    Spouse name: Not on file   Number of children: 1   Years of education: Not on file   Highest education level: Not on file  Occupational History   Not on file  Tobacco Use   Smoking status: Every Day    Packs/day: 0.50    Years: 15.00    Pack years: 7.50    Types: E-cigarettes, Cigarettes   Smokeless tobacco: Never   Tobacco comments:    has patches when he is ready to quit  Vaping Use   Vaping Use: Some days   Substances: Nicotine  Substance and Sexual Activity   Alcohol use: Yes    Comment:  occassional use on weekends   Drug use: Yes    Types: Cocaine, Marijuana    Comment: last use was Friday 03/05/2020   Sexual activity: Yes    Partners: Female    Birth control/protection: Condom    Comment: condoms given  Other Topics Concern   Not on file  Social History Narrative   Not on file   Social Determinants of Health   Financial Resource Strain: Not on file  Food Insecurity: Not on file  Transportation Needs: Not on file  Physical Activity: Not on file  Stress: Not on file  Social Connections: Not on file   SDOH:  SDOH Screenings   Alcohol Screen: Low Risk    Last Alcohol Screening Score (AUDIT): 3  Depression (PHQ2-9): Medium Risk   PHQ-2 Score: 15  Financial Resource Strain: Not on file  Food Insecurity: Not on file  Housing: Not on file  Physical Activity: Not on file  Social Connections: Not on file  Stress: Not on file  Tobacco Use: High Risk   Smoking Tobacco Use: Every Day   Smokeless Tobacco Use: Never   Passive Exposure: Not on file  Transportation Needs: Not on file   Additional Social History:    Pain Medications: See MAR Prescriptions: See MAR Over the Counter: See MAR History of alcohol / drug use?: Yes Longest period of sobriety (when/how long): 2 years Negative Consequences of Use: Financial, Legal Withdrawal Symptoms: None Name of Substance 1: Cocaine 1 - Age of First Use: Unknown 1 - Amount (size/oz): $10 1 - Frequency: Pt recently relapsed once after being clean for 6 months 1 - Duration: 1x 1 - Last Use / Amount: 4 days ago 1 - Method of Aquiring: Illegal purchase 1- Route of Use: Not assessed Name of Substance 2: EtOH 2 - Age of First Use: Not assessed 2 - Amount (size/oz): 2-3 12-ounce beers 2 - Frequency: Daily 2 - Duration: Ongoing 2 - Last Use / Amount: Today 2 - Method of Aquiring: Purchase 2 - Route of Substance Use: Oral                Sleep: Fair  Appetite:  Fair  Current Medications:  Current  Facility-Administered Medications  Medication Dose Route Frequency Provider Last Rate Last Admin   acetaminophen (TYLENOL) tablet 650 mg  650 mg Oral Q6H PRN Rozetta Nunnery, NP       alum & mag hydroxide-simeth (MAALOX/MYLANTA) 200-200-20 MG/5ML suspension 30 mL  30 mL Oral Q4H PRN Lindon Romp A, NP       bictegravir-emtricitabine-tenofovir AF (BIKTARVY) 50-200-25 MG per tablet 1 tablet  1 tablet Oral Daily Gwenlyn Found,  Rinaldo Ratel, NP   1 tablet at 10/28/20 0955   DULoxetine (CYMBALTA) DR capsule 60 mg  60 mg Oral Daily Lindon Romp A, NP   60 mg at 10/28/20 [id number]   gabapentin (NEURONTIN) capsule 600 mg  600 mg Oral BID Lindon Romp A, NP   600 mg at 10/28/20 [id number]   hydrOXYzine (ATARAX/VISTARIL) tablet 25 mg  25 mg Oral TID PRN Rozetta Nunnery, NP       magnesium hydroxide (MILK OF MAGNESIA) suspension 30 mL  30 mL Oral Daily PRN Rozetta Nunnery, NP       meloxicam (MOBIC) tablet 7.5 mg  7.5 mg Oral Daily Lindon Romp A, NP   7.5 mg at 10/28/20 [id number]   menthol-cetylpyridinium (CEPACOL) lozenge 3 mg  1 lozenge Oral PRN Ival Bible, MD       nicotine (NICODERM CQ - dosed in mg/24 hours) patch 14 mg  14 mg Transdermal Daily Ival Bible, MD       QUEtiapine (SEROQUEL) tablet 250 mg  250 mg Oral QHS Ival Bible, MD       rosuvastatin (CRESTOR) tablet 20 mg  20 mg Oral Daily Lindon Romp A, NP   20 mg at 10/28/20 [id number]   traZODone (DESYREL) tablet 150 mg  150 mg Oral QHS Lindon Romp A, NP   150 mg at 10/27/20 2122   Current Outpatient Medications  Medication Sig Dispense Refill   bictegravir-emtricitabine-tenofovir AF (BIKTARVY) 50-200-25 MG TABS tablet Take 1 tablet by mouth daily. 30 tablet 6   gabapentin (NEURONTIN) 300 MG capsule Take orally 2 caps ('600mg'$ ) in the morning and 3 caps ('900mg'$ ) in the evening 150 capsule 3   meloxicam (MOBIC) 7.5 MG tablet Take 1 tablet (7.5 mg total) by mouth daily. 30 tablet 0   rosuvastatin (CRESTOR) 20 MG tablet Take 1 tablet (20 mg total) by mouth  daily. 30 tablet 6   DULoxetine (CYMBALTA) 60 MG capsule Take 1 capsule (60 mg total) by mouth daily. For neuropathy 30 capsule 3   hydrOXYzine (ATARAX/VISTARIL) 25 MG tablet Take 1 tablet (25 mg total) by mouth 3 (three) times daily as needed for anxiety. 75 tablet 0   QUEtiapine (SEROQUEL) 200 MG tablet Take 1 tablet (200 mg total) by mouth at bedtime. 30 tablet 1   traZODone (DESYREL) 150 MG tablet Take 1 tablet (150 mg total) by mouth at bedtime. 30 tablet 0    Labs  Lab Results:  Admission on 10/25/2020  Component Date Value Ref Range Status   SARS Coronavirus 2 by RT PCR 10/26/2020 NEGATIVE  NEGATIVE Final   Comment: (NOTE) SARS-CoV-2 target nucleic acids are NOT DETECTED.  The SARS-CoV-2 RNA is generally detectable in upper respiratory specimens during the acute phase of infection. The lowest concentration of SARS-CoV-2 viral copies this assay can detect is 138 copies/mL. A negative result does not preclude SARS-Cov-2 infection and should not be used as the sole basis for treatment or other patient management decisions. A negative result may occur with  improper specimen collection/handling, submission of specimen other than nasopharyngeal swab, presence of viral mutation(s) within the areas targeted by this assay, and inadequate number of viral copies(<138 copies/mL). A negative result must be combined with clinical observations, patient history, and epidemiological information. The expected result is Negative.  Fact Sheet for Patients:  EntrepreneurPulse.com.au  Fact Sheet for Healthcare Providers:  IncredibleEmployment.be  This test is no  t yet approved or cleared by the [place] and  has been authorized for detection and/or diagnosis of SARS-CoV-2 by FDA under an Emergency Use Authorization (EUA). This EUA will remain  in effect (meaning this test can be used) for the duration of the COVID-19  declaration under Section 564(b)(1) of the Act, 21 U.S.C.section 360bbb-3(b)(1), unless the authorization is terminated  or revoked sooner.       Influenza A by PCR 10/26/2020 NEGATIVE  NEGATIVE Final   Influenza B by PCR 10/26/2020 NEGATIVE  NEGATIVE Final   Comment: (NOTE) The Xpert Xpress SARS-CoV-2/FLU/RSV plus assay is intended as an aid in the diagnosis of influenza from Nasopharyngeal swab specimens and should not be used as a sole basis for treatment. Nasal washings and aspirates are unacceptable for Xpert Xpress SARS-CoV-2/FLU/RSV testing.  Fact Sheet for Patients: EntrepreneurPulse.com.au  Fact Sheet for Healthcare Providers: IncredibleEmployment.be  This test is not yet approved or cleared by the [place] FDA and has been authorized for detection and/or diagnosis of SARS-CoV-2 by FDA under an Emergency Use Authorization (EUA). This EUA will remain in effect (meaning this test can be used) for the duration of the COVID-19 declaration under Section 564(b)(1) of the Act, 21 U.S.C. section 360bbb-3(b)(1), unless the authorization is terminated or revoked.  Performed at [facility], [place] [address] [place], [name] [address]    SARS Coronavirus 2 Ag 10/26/2020 Negative  Negative Preliminary   WBC 10/26/2020 7.7  4.0 - 10.5 K/uL Final   RBC 10/26/2020 4.58  4.22 - 5.81 MIL/uL Final   Hemoglobin 10/26/2020 14.1  13.0 - 17.0 g/dL Final   HCT 10/26/2020 41.4  39.0 - 52.0 % Final   MCV 10/26/2020 90.4  80.0 - 100.0 fL Final   MCH 10/26/2020 30.8  26.0 - 34.0 pg Final   MCHC 10/26/2020 34.1  30.0 - 36.0 g/dL Final   RDW 10/26/2020 12.6  11.5 - 15.5 % Final   Platelets 10/26/2020 218  150 - 400 K/uL Final   nRBC 10/26/2020 0.0  0.0 - 0.2 % Final   Neutrophils Relative % 10/26/2020 58  % Final   Neutro Abs 10/26/2020 4.4  1.7 - 7.7 K/uL Final   Lymphocytes Relative 10/26/2020 36  % Final   Lymphs Abs 10/26/2020 2.8  0.7 - 4.0  K/uL Final   Monocytes Relative 10/26/2020 5  % Final   Monocytes Absolute 10/26/2020 0.4  0.1 - 1.0 K/uL Final   Eosinophils Relative 10/26/2020 1  % Final   Eosinophils Absolute 10/26/2020 0.1  0.0 - 0.5 K/uL Final   Basophils Relative 10/26/2020 0  % Final   Basophils Absolute 10/26/2020 0.0  0.0 - 0.1 K/uL Final   Immature Granulocytes 10/26/2020 0  % Final   Abs Immature Granulocytes 10/26/2020 0.00  0.00 - 0.07 K/uL Final   Performed at [facility], [place] [address], [place], [name] [address]   Sodium 10/26/2020 136  135 - 145 mmol/L Final   Potassium 10/26/2020 3.8  3.5 - 5.1 mmol/L Final   Chloride 10/26/2020 105  98 - 111 mmol/L Final   CO2 10/26/2020 21 (A)  22 - 32 mmol/L Final   Glucose, Bld 10/26/2020 151 (A)  70 - 99 mg/dL Final   Glucose reference range applies only to samples taken after fasting for at least 8 hours.   BUN 10/26/2020 15  6 - 20 mg/dL Final   Creatinine, Ser 10/26/2020 1.16  0.61 - 1.24 mg/dL Final  Calcium 10/26/2020 9.7  8.9 - 10.3 mg/dL Final   Total Protein 10/26/2020 6.9  6.5 - 8.1 g/dL Final   Albumin 10/26/2020 4.1  3.5 - 5.0 g/dL Final   AST 10/26/2020 34  15 - 41 U/L Final   ALT 10/26/2020 38  0 - 44 U/L Final   Alkaline Phosphatase 10/26/2020 91  38 - 126 U/L Final   Total Bilirubin 10/26/2020 0.7  0.3 - 1.2 mg/dL Final   GFR, Estimated 10/26/2020 >60  >60 mL/min Final   Comment: (NOTE) Calculated using the CKD-EPI Creatinine Equation (2021)    Anion gap 10/26/2020 10  5 - 15 Final   Performed at [facility], [place] [address] [place], [name] [address]   Hgb A1c MFr Bld 10/26/2020 6.0 (A)  4.8 - 5.6 % Final   Comment: (NOTE) Pre diabetes:          5.7%-6.4%  Diabetes:              >6.4%  Glycemic control for   <7.0% adults with diabetes    Mean Plasma Glucose 10/26/2020 125.5  mg/dL Final   Performed at [facility], [place] [address], [place], [place] [address]   Alcohol, Ethyl (B) 10/26/2020 <10  <10 mg/dL Final    Comment: (NOTE) Lowest detectable limit for serum alcohol is 10 mg/dL.  For medical purposes only. Performed at [facility], [place] [address], [place], [place] [address]    Cholesterol 10/26/2020 153  0 - 200 mg/dL Final   Triglycerides 10/26/2020 80  <150 mg/dL Final   HDL 10/26/2020 43  >40 mg/dL Final   Total CHOL/HDL Ratio 10/26/2020 3.6  RATIO Final   VLDL 10/26/2020 16  0 - 40 mg/dL Final   LDL Cholesterol 10/26/2020 94  0 - 99 mg/dL Final   Comment:        Total Cholesterol/HDL:CHD Risk Coronary Heart Disease Risk Table                     Men   Women  1/2 Average Risk   3.4   3.3  Average Risk       5.0   4.4  2 X Average Risk   9.6   7.1  3 X Average Risk  23.4   11.0        Use the calculated Patient Ratio above and the CHD Risk Table to determine the patient's CHD Risk.        ATP III CLASSIFICATION (LDL):  <100     mg/dL   Optimal  100-129  mg/dL   Near or Above                    Optimal  130-159  mg/dL   Borderline  160-189  mg/dL   High  >190     mg/dL   Very High Performed at [facility] [address] [place], [place] [address]    TSH 10/26/2020 0.512  0.350 - 4.500 uIU/mL Final   Comment: Performed by a 3rd Generation assay with a functional sensitivity of <=0.01 uIU/mL. Performed at [facility], [place] [address], [place], [name] [address]    POC Amphetamine UR 10/26/2020 None Detected  NONE DETECTED (Cut Off Level 1000 ng/mL) Final   POC Secobarbital (BAR) 10/26/2020 None Detected  NONE DETECTED (Cut Off Level 300 ng/mL) Final   POC Buprenorphine (BUP) 10/26/2020 None Detected  NONE DETECTED (  Cut Off Level 10 ng/mL) Final   POC Oxazepam (BZO) 10/26/2020 Positive (A)  NONE DETECTED (Cut Off Level 300 ng/mL) Final   POC Cocaine UR 10/26/2020 Positive (A)  NONE DETECTED (Cut Off Level 300 ng/mL) Final   POC Methamphetamine UR 10/26/2020 None Detected  NONE DETECTED (Cut Off Level 1000 ng/mL) Final   POC Morphine 10/26/2020 None Detected   NONE DETECTED (Cut Off Level 300 ng/mL) Final   POC Oxycodone UR 10/26/2020 None Detected  NONE DETECTED (Cut Off Level 100 ng/mL) Final   POC Methadone UR 10/26/2020 None Detected  NONE DETECTED (Cut Off Level 300 ng/mL) Final   POC Marijuana UR 10/26/2020 None Detected  NONE DETECTED (Cut Off Level 50 ng/mL) Final  Admission on 08/24/2020, Discharged on 08/24/2020  Component Date Value Ref Range Status   WBC 08/24/2020 11.5 (A)  4.0 - 10.5 K/uL Final   RBC 08/24/2020 4.93  4.22 - 5.81 MIL/uL Final   Hemoglobin 08/24/2020 15.0  13.0 - 17.0 g/dL Final   HCT 08/24/2020 46.0  39.0 - 52.0 % Final   MCV 08/24/2020 93.3  80.0 - 100.0 fL Final   MCH 08/24/2020 30.4  26.0 - 34.0 pg Final   MCHC 08/24/2020 32.6  30.0 - 36.0 g/dL Final   RDW 08/24/2020 13.0  11.5 - 15.5 % Final   Platelets 08/24/2020 217  150 - 400 K/uL Final   nRBC 08/24/2020 0.0  0.0 - 0.2 % Final   Neutrophils Relative % 08/24/2020 73  % Final   Neutro Abs 08/24/2020 8.3 (A)  1.7 - 7.7 K/uL Final   Lymphocytes Relative 08/24/2020 20  % Final   Lymphs Abs 08/24/2020 2.3  0.7 - 4.0 K/uL Final   Monocytes Relative 08/24/2020 7  % Final   Monocytes Absolute 08/24/2020 0.8  0.1 - 1.0 K/uL Final   Eosinophils Relative 08/24/2020 0  % Final   Eosinophils Absolute 08/24/2020 0.0  0.0 - 0.5 K/uL Final   Basophils Relative 08/24/2020 0  % Final   Basophils Absolute 08/24/2020 0.0  0.0 - 0.1 K/uL Final   Immature Granulocytes 08/24/2020 0  % Final   Abs Immature Granulocytes 08/24/2020 0.04  0.00 - 0.07 K/uL Final   Performed at [facility], [place] [address], [place], [name] [address]   Sodium 08/24/2020 138  135 - 145 mmol/L Final   Potassium 08/24/2020 4.3  3.5 - 5.1 mmol/L Final   Chloride 08/24/2020 103  98 - 111 mmol/L Final   CO2 08/24/2020 26  22 - 32 mmol/L Final   Glucose, Bld 08/24/2020 88  70 - 99 mg/dL Final   Glucose reference range applies only to samples taken after fasting for at least 8 hours.    BUN 08/24/2020 15  6 - 20 mg/dL Final   Creatinine, Ser 08/24/2020 1.21  0.61 - 1.24 mg/dL Final   Calcium 08/24/2020 9.8  8.9 - 10.3 mg/dL Final   Total Protein 08/24/2020 8.7 (A)  6.5 - 8.1 g/dL Final   Albumin 08/24/2020 4.8  3.5 - 5.0 g/dL Final   AST 08/24/2020 49 (A)  15 - 41 U/L Final   ALT 08/24/2020 47 (A)  0 - 44 U/L Final   Alkaline Phosphatase 08/24/2020 90  38 - 126 U/L Final   Total Bilirubin 08/24/2020 0.7  0.3 - 1.2 mg/dL Final   GFR, Estimated 08/24/2020 >60  >60 mL/min Final   Comment: (NOTE) Calculated using the CKD-EPI Creatinine Equation (2021)    Anion gap  08/24/2020 9  5 - 15 Final   Performed at [facility], [place] [address], [place], [place] [address]   Alcohol, Ethyl (B) 08/24/2020 <10  <10 mg/dL Final   Comment: (NOTE) Lowest detectable limit for serum alcohol is 10 mg/dL.  For medical purposes only. Performed at [facility], [place] [address], [place], [name] [address]    Acetaminophen (Tylenol), Serum 08/24/2020 <10 (A)  10 - 30 ug/mL Final   Comment: (NOTE) Therapeutic concentrations vary significantly. A range of 10-30 ug/mL  may be an effective concentration for many patients. However, some  are best treated at concentrations outside of this range. Acetaminophen concentrations >150 ug/mL at 4 hours after ingestion  and >50 ug/mL at 12 hours after ingestion are often associated with  toxic reactions.  Performed at [facility], [place] [address], [place], [name] [address]    Troponin I (High Sensitivity) 08/24/2020 5  <18 ng/L Final   Comment: (NOTE) Elevated high sensitivity troponin I (hsTnI) values and significant  changes across serial measurements may suggest ACS but many other  chronic and acute conditions are known to elevate hsTnI results.  Refer to the "Links" section for chest pain algorithms and additional  guidance. Performed at [facility], [place] [address] [place], [place] [address]    SARS Coronavirus 2 by RT PCR 08/24/2020 NEGATIVE  NEGATIVE Final   Comment: (NOTE) SARS-CoV-2 target nucleic acids are NOT DETECTED.  The SARS-CoV-2 RNA is generally detectable in upper respiratory specimens during the acute phase of infection. The lowest concentration of SARS-CoV-2 viral copies this assay can detect is 138 copies/mL. A negative result does not preclude SARS-Cov-2 infection and should not be used as the sole basis for treatment or other patient management decisions. A negative result may occur with  improper specimen collection/handling, submission of specimen other than nasopharyngeal swab, presence of viral mutation(s) within the areas targeted by this assay, and inadequate number of viral copies(<138 copies/mL). A negative result must be combined with clinical observations, patient history, and epidemiological information. The expected result is Negative.  Fact Sheet for Patients:  EntrepreneurPulse.com.au  Fact Sheet for Healthcare Providers:  IncredibleEmployment.be  This test is no                          t yet approved or cleared by the [place] FDA and  has been authorized for detection and/or diagnosis of SARS-CoV-2 by FDA under an Emergency Use Authorization (EUA). This EUA will remain  in effect (meaning this test can be used) for the duration of the COVID-19 declaration under Section 564(b)(1) of the Act, 21 U.S.C.section 360bbb-3(b)(1), unless the authorization is terminated  or revoked sooner.       Influenza A by PCR 08/24/2020 NEGATIVE  NEGATIVE Final   Influenza B by PCR 08/24/2020 NEGATIVE  NEGATIVE Final   Comment: (NOTE) The Xpert Xpress SARS-CoV-2/FLU/RSV plus assay is intended as an aid in the diagnosis of influenza from Nasopharyngeal swab specimens and should not be used as a sole basis for treatment. Nasal washings and aspirates are unacceptable for Xpert Xpress  SARS-CoV-2/FLU/RSV testing.  Fact Sheet for Patients: EntrepreneurPulse.com.au  Fact Sheet for Healthcare Providers: IncredibleEmployment.be  This test is not yet approved or cleared by the [place] FDA and has been authorized for detection and/or diagnosis of SARS-CoV-2 by FDA under an Emergency Use Authorization (EUA). This EUA will remain in effect (meaning this  test can be used) for the duration of the COVID-19 declaration under Section 564(b)(1) of the Act, 21 U.S.C. section 360bbb-3(b)(1), unless the authorization is terminated or revoked.  Performed at [facility], [place] [address] [place], [place] [address]   Office Visit on 07/14/2020  Component Date Value Ref Range Status   Vitamin B-12 07/14/2020 403  200 - 1,100 pg/mL Final   Sed Rate 07/14/2020 2  0 - 20 mm/h Final   T3 Uptake 07/14/2020 29  22 - 35 % Final   T4, Total 07/14/2020 6.4  4.9 - 10.5 mcg/dL Final   Free Thyroxine Index 07/14/2020 1.9  1.4 - 3.8 Final   TSH 07/14/2020 1.59  0.40 - 4.50 mIU/L Final  Appointment on 07/06/2020  Component Date Value Ref Range Status   RPR Ser Ql 07/06/2020 NON-REACTIVE  NON-REACTIVE Final   CD4 T Cell Abs 07/06/2020 648  400 - 1,790 /uL Final   CD4 % Helper T Cell 07/06/2020 38  33 - 65 % Final   Performed at [facility], [place] [address] [place], [place] [address]   HIV 1 RNA Quant 07/06/2020 Not Detected  Copies/mL Final   HIV-1 RNA Quant, Log 07/06/2020 Not Detected  Log cps/mL Final   Comment: . Reference Range:                           Not Detected     copies/mL                           Not Detected Log copies/mL . Marland Kitchen The test was performed using Real-Time Polymerase Chain Reaction. . . Reportable Range: 20 copies/mL to 10,000,000 copies/mL (1.30 Log copies/mL to 7.00 Log copies/mL). .    Glucose, Bld 07/06/2020 95  65 - 99 mg/dL Final   Comment: .            Fasting reference  interval .    BUN 07/06/2020 15  7 - 25 mg/dL Final   Creat 07/06/2020 1.06  0.70 - 1.33 mg/dL Final   Comment: For patients >35 years of age, the reference limit for Creatinine is approximately 13% higher for people identified as African-American. .    GFR, Est Non African American 07/06/2020 79  > OR = 60 mL/min/1.76m Final   GFR, Est African American 07/06/2020 92  > OR = 60 mL/min/1.750mFinal   BUN/Creatinine Ratio [id number] APPLICABLE  6 - 22 (calc) Final   Sodium 07/06/2020 138  135 - 146 mmol/L Final   Potassium 07/06/2020 4.3  3.5 - 5.3 mmol/L Final   Chloride 07/06/2020 107  98 - 110 mmol/L Final   CO2 07/06/2020 24  20 - 32 mmol/L Final   Calcium 07/06/2020 9.2  8.6 - 10.3 mg/dL Final   Total Protein 07/06/2020 6.8  6.1 - 8.1 g/dL Final   Albumin 07/06/2020 4.1  3.6 - 5.1 g/dL Final   Globulin 07/06/2020 2.7  1.9 - 3.7 g/dL (calc) Final   AG Ratio 07/06/2020 1.5  1.0 - 2.5 (calc) Final   Total Bilirubin 07/06/2020 0.4  0.2 - 1.2 mg/dL Final   Alkaline phosphatase (APISO) 07/06/2020 87  35 - 144 U/L Final   AST 07/06/2020 32  10 - 35 U/L Final   ALT 07/06/2020 33  9 - 46 U/L Final   WBC 07/06/2020 4.5  3.8 - 10.8 Thousand/uL Final  RBC 07/06/2020 4.50  4.20 - 5.80 Million/uL Final   Hemoglobin 07/06/2020 13.7  13.2 - 17.1 g/dL Final   HCT 07/06/2020 41.0  38.5 - 50.0 % Final   MCV 07/06/2020 91.1  80.0 - 100.0 fL Final   MCH 07/06/2020 30.4  27.0 - 33.0 pg Final   MCHC 07/06/2020 33.4  32.0 - 36.0 g/dL Final   RDW 07/06/2020 12.9  11.0 - 15.0 % Final   Platelets 07/06/2020 163  140 - 400 Thousand/uL Final   MPV 07/06/2020 11.7  7.5 - 12.5 fL Final   Neutro Abs 07/06/2020 2,183  1,500 - 7,800 cells/uL Final   Lymphs Abs 07/06/2020 1,778  850 - 3,900 cells/uL Final   Absolute Monocytes 07/06/2020 351  200 - 950 cells/uL Final   Eosinophils Absolute 07/06/2020 171  15 - 500 cells/uL Final   Basophils Absolute 07/06/2020 18  0 - 200 cells/uL Final   Neutrophils  Relative % 07/06/2020 48.5  % Final   Total Lymphocyte 07/06/2020 39.5  % Final   Monocytes Relative 07/06/2020 7.8  % Final   Eosinophils Relative 07/06/2020 3.8  % Final   Basophils Relative 07/06/2020 0.4  % Final   Neisseria Gonorrhea 07/06/2020 Negative   Final   Chlamydia 07/06/2020 Negative   Final   Comment 07/06/2020 Normal Reference [place] Chlamydia - Negative   Final   Comment 07/06/2020 Normal Reference Range Neisseria Gonorrhea - Negative   Final  Admission on 04/28/2020, Discharged on 04/29/2020  Component Date Value Ref Range Status   Cholesterol 04/28/2020 177  0 - 200 mg/dL Final   Triglycerides 04/28/2020 105  <150 mg/dL Final   HDL 04/28/2020 55  >40 mg/dL Final   Total CHOL/HDL Ratio 04/28/2020 3.2  RATIO Final   VLDL 04/28/2020 21  0 - 40 mg/dL Final   LDL Cholesterol 04/28/2020 101 (A)  0 - 99 mg/dL Final   Comment:        Total Cholesterol/HDL:CHD Risk Coronary Heart Disease Risk Table                     Men   Women  1/2 Average Risk   3.4   3.3  Average Risk       5.0   4.4  2 X Average Risk   9.6   7.1  3 X Average Risk  23.4   11.0        Use the calculated Patient Ratio above and the CHD Risk Table to determine the patient's CHD Risk.        ATP III CLASSIFICATION (LDL):  <100     mg/dL   Optimal  100-129  mg/dL   Near or Above                    Optimal  130-159  mg/dL   Borderline  160-189  mg/dL   High  >190     mg/dL   Very High Performed at [facility], [place], [name] [address]    Hgb A1c MFr Bld 04/28/2020 6.2 (A)  4.8 - 5.6 % Final   Comment: (NOTE) Pre diabetes:          5.7%-6.4%  Diabetes:              >6.4%  Glycemic control for   <7.0% adults with diabetes    Mean Plasma Glucose 04/28/2020 131.24  mg/dL Final   Performed at [facility], [address] [place], [name] [address]   Hepatitis B Surface Ag 04/28/2020 Reactive (A)  NON REACTIVE Final   Sample Positive for HBsAg. Result confirmed  by neutralization.   HCV Ab 04/28/2020 NON REACTIVE  NON REACTIVE Final   Comment: (NOTE) Nonreactive HCV antibody screen is consistent with no HCV infections,  unless recent infection is suspected or other evidence exists to indicate HCV infection.     Hep A IgM 04/28/2020 NON REACTIVE  NON REACTIVE Final   Hep B C IgM 04/28/2020 NON REACTIVE  NON REACTIVE Final   Performed at [facility], [place] [address] [place], [place] [address]    Blood Alcohol level:  Lab Results  Component Value Date   ETH <10 10/26/2020   ETH <10 [id number]    Metabolic Disorder Labs: Lab Results  Component Value Date   HGBA1C 6.0 (H) 10/26/2020   MPG 125.5 10/26/2020   MPG 131.24 04/28/2020   Lab Results  Component Value Date   PROLACTIN 12.1 03/08/2020   PROLACTIN 2.6 (L) 11/09/2019   Lab Results  Component Value Date   CHOL 153 10/26/2020   TRIG 80 10/26/2020   HDL 43 10/26/2020   CHOLHDL 3.6 10/26/2020   VLDL 16 10/26/2020   LDLCALC 94 10/26/2020   LDLCALC 101 (H) 04/28/2020    Therapeutic Lab Levels: No results found for: LITHIUM No results found for: VALPROATE No components found for:  CBMZ  Physical Findings   AIMS    Flowsheet Row Admission (Discharged) from 04/28/2020 in [facility] Admission (Discharged) from 03/07/2020 in [facility]  AIMS Total Score 0 0      AUDIT    Flowsheet Row Admission (Discharged) from 04/28/2020 in [facility] Admission (Discharged) from 03/07/2020 in [facility] Admission (Discharged) from 12/12/2011 in [facility]  Alcohol Use Disorder Identification Test Final Score (AUDIT) '3 13 8      '$ GAD-7    Flowsheet Row Office Visit from 03/17/2020 in [facility] Office Visit from 07/22/2019 in [facility] Office Visit from 09/03/2018 in [place] [facility]  Total GAD-7 Score '16 19 19      '$ PHQ2-9    Flowsheet Row ED from 10/25/2020 in [facility] Video Visit from 04/05/2020 in [facility] Office Visit from 03/17/2020 in [facility] ED from 11/09/2019 in [facility] Office Visit from 07/22/2019 in [facility]  PHQ-2 Total Score 5 0 '1 6 4  '$ PHQ-9 Total Score 15 -- '9 19 15      '$ Flowsheet Row ED from 10/25/2020 in [facility] ED from 08/24/2020 in [facility] DEPT Admission (Discharged) from 04/28/2020 in [facility] Low Risk High Risk Moderate Risk        Musculoskeletal  Strength & Muscle Tone: within normal limits Gait & Station: normal Patient leans: N/A  Psychiatric Specialty Exam  Presentation  General Appearance: Appropriate for Environment; Casual  Eye Contact:Good  Speech:Clear and Coherent; Normal Rate  Speech Volume:Normal  Handedness:Right   Mood and Affect  Mood:-- (3/10)  Affect:Appropriate; Congruent; Constricted; Other (comment) (dysphoric)   Thought Process  Thought Processes:Coherent; Goal Directed; Linear  Descriptions of Associations:Intact  Orientation:Full (Time, Place and Person)  Thought Content:WDL; Logical  Diagnosis of Schizophrenia or Schizoaffective disorder in past: No  Duration of Psychotic Symptoms: Greater than six months   Hallucinations:Hallucinations: None; Auditory Description of Auditory Hallucinations: AH of voices telling him to harm others and  himself, last night  Ideas of Reference:None  Suicidal Thoughts:Suicidal Thoughts: Yes, Passive ("less thoughts")   Homicidal Thoughts:Homicidal Thoughts: No   Sensorium  Memory:Immediate Good; Recent Good; Remote  Good  Judgment:Fair  Insight:Fair   Executive Functions  Concentration:Good  Attention Span:Good  [place] of Knowledge:Good  Language:Good   Psychomotor Activity  Psychomotor Activity:Psychomotor Activity: Normal   Assets  Assets:Communication Skills; Desire for Improvement; Resilience   Sleep  Sleep:Sleep: Fair   No data recorded   Physical Exam  Physical Exam Constitutional:      Appearance: Normal appearance. He is normal weight.  HENT:     Head: Normocephalic and atraumatic.  Eyes:     Extraocular Movements: Extraocular movements intact.     Conjunctiva/sclera: Conjunctivae normal.  Pulmonary:     Effort: Pulmonary effort is normal.  Neurological:     General: No focal deficit present.     Mental Status: He is alert and oriented to person, place, and time.  Psychiatric:        Attention and Perception: Attention and perception normal.        Speech: Speech normal.        Behavior: Behavior normal. Behavior is cooperative.        Thought Content: Thought content normal.   Review of Systems  Constitutional:  Negative for chills and fever.  HENT:  Positive for sore throat. Negative for congestion and hearing loss.   Eyes:  Negative for discharge and redness.  Respiratory:  Negative for cough and shortness of breath.   Cardiovascular:  Negative for chest pain.  Gastrointestinal:  Negative for abdominal pain.  Musculoskeletal:  Negative for myalgias.  Neurological:  Negative for headaches.  Psychiatric/Behavioral:  Positive for depression and substance abuse. Negative for hallucinations and suicidal ideas.   Blood pressure 100/61, pulse 76, temperature (!) 97.3 F (36.3 C), temperature source Oral, resp. rate 20, SpO2 98 %. There is no height or weight on file to calculate BMI.  Treatment Plan Summary: 55 year old man with a history of schizoaffective disorder, depressive type and substance use who presented to the [facility] for suicidal  thoughts and plan to jump from a parking garage on 10/18..  Patient was admitted to the [facility] for treatment on 10/18.  UDS+cocaine and benzodiazepines. Patient was restarted on his home medications upon admission to the [facility] which were continued at the [facility].   Today patient reported experiencing auditory hallucinations last night that instructed for him to hurt himself and other people.  Patient denies SI/HI/AVH today.  Discussed increasing Seroquel dose-patient amenable.  Patient remains appropriate for treatment at the [facility] at this time.   Schizoaffective disorder, depressive type -increase seroquel 250 from 200  mg qhs (10/20) -continue cymbalta 60 mg daily -cotninue trazodone 150 gm qhs prn  Polysubstance abuse -UDS+cocaine and BZD -pt expresses interest in IOP for SA  Neuropathy -cotninute gabapentin 600 mg BID -cymbalta also has pain properties to assist with neuropathic pain -mobic 7.5 mg daily  HIV -continue biktarvy  HLD -continue crestor 20 mg  Dispo: Ongoing.  Patient expresses interest in intensive outpatient substance use programs; however, is not currently interested in residential treatment programs.  Social work assisting with disposition.   Ival Bible, MD 10/28/2020 2:06 PM

## 2020-10-28 NOTE — Group Note (Signed)
Group Topic: Relapse and Recovery  Group Date: 10/28/2020 Start Time: 1010 End Time: Cataract Facilitators: Laury Axon E  Department: [facility]  Number of Participants: 6  Group Focus: goals/reality orientation, personal responsibility, and problem solving Treatment Modality:  Solution-Focused Therapy Interventions utilized were patient education and problem solving Purpose: enhance coping skills, express feelings, relapse prevention strategies, and trigger / craving management  Name: Dylan [name] Date of Birth: January 17, 1965  MR: [id number]    Level of Participation: minimal Quality of Participation: cooperative Interactions with others: n/a Mood/Affect: appropriate Triggers (if applicable): n/a Cognition: coherent/clear Progress: Minimal Response: n/a Plan: follow-up needed  Patients Problems:  Patient Active Problem List   Diagnosis Date Noted   Substance induced mood disorder ([place]) 10/26/2020   Bipolar affective disorder, mixed, severe, with psychotic behavior ([place]) 04/28/2020   Fatty liver 03/09/2020   Schizophrenia ([place]) 03/08/2020   Generalized anxiety disorder 02/03/2020   Insomnia due to other mental disorder 02/03/2020   Lisfranc dislocation, right, initial encounter    Prediabetes 07/22/2019   Tobacco abuse 07/10/2018   ESRD needing dialysis ([place]) 03/03/2018   Schizoaffective disorder, bipolar type ([place])    Acute renal failure (ARF) ([place])    Suicidal ideations    Chronic 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 coma ([place])    AKI (acute kidney injury) ([place]) 02/19/2017   Rhabdomyolysis 02/19/2017   Suicidal overdose ([place]) 02/19/2017   Acute hepatitis 02/19/2017   Elevated LFTs    Schizoaffective disorder, depressive type ([place]) 09/05/2011   Degenerative disc disease 08/21/2011   HIV disease ([place]) 08/17/2011

## 2020-10-28 NOTE — Progress Notes (Signed)
Patient visible on unit.  Watching television and interacting with peers. Continues to complain of sore throat.  Given Cepacol lozenge.  Continue to monitor for safety.

## 2020-10-28 NOTE — ED Notes (Signed)
Alert and oriented x4 , in dayroom at onset of shift some interaction with peers , calm and cooperative on approach , denies SI / HI

## 2020-10-28 NOTE — ED Notes (Signed)
Lying in bed quietly , respirations even and unlabored , no distress noted , will continue to monitor

## 2020-10-28 NOTE — ED Notes (Signed)
Snacks given 

## 2020-10-29 DIAGNOSIS — F5105 Insomnia due to other mental disorder: Secondary | ICD-10-CM | POA: Diagnosis not present

## 2020-10-29 DIAGNOSIS — F1994 Other psychoactive substance use, unspecified with psychoactive substance-induced mood disorder: Secondary | ICD-10-CM | POA: Diagnosis not present

## 2020-10-29 DIAGNOSIS — F411 Generalized anxiety disorder: Secondary | ICD-10-CM | POA: Diagnosis not present

## 2020-10-29 DIAGNOSIS — F251 Schizoaffective disorder, depressive type: Secondary | ICD-10-CM | POA: Diagnosis not present

## 2020-10-29 MED ORDER — PHENOL 1.4 % MT LIQD
1.0000 | OROMUCOSAL | Status: DC | PRN
  Administered 2020-10-29 – 2020-10-30 (×3): 1 via OROMUCOSAL
  Filled 2020-10-29: qty 177

## 2020-10-29 MED ORDER — GUAIFENESIN ER 600 MG PO TB12
600.0000 mg | ORAL_TABLET | Freq: Two times a day (BID) | ORAL | Status: DC | PRN
  Administered 2020-10-29: 600 mg via ORAL
  Filled 2020-10-29: qty 1

## 2020-10-29 NOTE — ED Provider Notes (Signed)
Behavioral Health Progress Note  Date and Time: 10/29/2020 12:59 PM Name: Dylan [name] MRN:  [id number]  Subjective: Patient chart reviewed-patient has been medication compliant, has been attending groups, has been appropriate with staff and peers on the unit.    Patient seen this morning on the unit-he is calm, cooperative and pleasant.  Patient states that he continues to have a sore throat despite having lozenges available.  He states that he has had a sore throat for about 3 days.  He reports some congestion this morning; denies fever, cough, shortness of breath.  Patient reports that he slept well yesterday with increased dose of Seroquel and denies experiencing any hallucinations.  He describes his mood is "hopeful".  Affect is much brighter than yesterday.  Patient states that he attempted to call his case manager yesterday but was unable to reach her.  He states he has been in contact with [facility] and discusses that this is where he would like to discharge.  Patient denies SI/HI/AVH.  Patient states that groups have been going well and he has found them to be helpful.  Patient states that he has an interview with [facility] either today or tomorrow; he states that the interviews are usually on Sundays however [facility] staff have agreed to do interview early.     Diagnosis:  Final diagnoses:  Schizoaffective disorder, depressive type ([place])  Generalized anxiety disorder  Insomnia due to other mental disorder    Total Time spent with patient: 15 minutes  Past Psychiatric History: as per H&P Past Medical History:  Past Medical History:  Diagnosis Date   Anxiety    Arthritis    Bipolar 1 disorder ([place])    Colon polyps    Depression    GERD (gastroesophageal reflux disease)    Hepatitis B    Human immunodeficiency virus (HIV) ([place])    Hyperlipidemia    Hypertension    Neuromuscular disorder ([place])    neuropathy   Neuropathy    Pre-diabetes    Schizophrenia ([place])      Past Surgical History:  Procedure Laterality Date   FOOT ARTHRODESIS Right 09/17/2019   Procedure: FUSION RIGHT LISFRANC JOINT;  Surgeon: Newt Minion, MD;  Location: [facility];  Service: Orthopedics;  Laterality: Right;   FOOT ARTHRODESIS Right 09/2019   HEMORROIDECTOMY     IR FLUORO GUIDE CV LINE RIGHT  02/22/2017   IR US GUIDE VASC ACCESS RIGHT  02/22/2017   TUMOR REMOVAL     From Chest   Family History:  Family History  Problem Relation Age of Onset   CAD Mother    Diabetes Father    Colon cancer Maternal Aunt 60   Diabetes Maternal Aunt    Heart disease Maternal Uncle    Esophageal cancer Neg Hx    Rectal cancer Neg Hx    Stomach cancer Neg Hx    Family Psychiatric  History: as per H&P Social History:  Social History   Substance and Sexual Activity  Alcohol Use Yes   Comment: occassional use on weekends     Social History   Substance and Sexual Activity  Drug Use Yes   Types: Cocaine, Marijuana   Comment: last use was Friday 03/05/2020    Social History   Socioeconomic History   Marital status: Married    Spouse name: Not on file   Number of children: 1   Years of education: Not on file   Highest education level: Not on file  Occupational History   Not on file  Tobacco Use   Smoking status: Every Day    Packs/day: 0.50    Years: 15.00    Pack years: 7.50    Types: E-cigarettes, Cigarettes   Smokeless tobacco: Never   Tobacco comments:    has patches when he is ready to quit  Vaping Use   Vaping Use: Some days   Substances: Nicotine  Substance and Sexual Activity   Alcohol use: Yes    Comment: occassional use on weekends   Drug use: Yes    Types: Cocaine, Marijuana    Comment: last use was Friday 03/05/2020   Sexual activity: Yes    Partners: Female    Birth control/protection: Condom    Comment: condoms given  Other Topics Concern   Not on file  Social History Narrative   Not on file   Social Determinants of Health   Financial Resource  Strain: Not on file  Food Insecurity: Not on file  Transportation Needs: Not on file  Physical Activity: Not on file  Stress: Not on file  Social Connections: Not on file   SDOH:  SDOH Screenings   Alcohol Screen: Low Risk    Last Alcohol Screening Score (AUDIT): 3  Depression (PHQ2-9): Medium Risk   PHQ-2 Score: 15  Financial Resource Strain: Not on file  Food Insecurity: Not on file  Housing: Not on file  Physical Activity: Not on file  Social Connections: Not on file  Stress: Not on file  Tobacco Use: High Risk   Smoking Tobacco Use: Every Day   Smokeless Tobacco Use: Never   Passive Exposure: Not on file  Transportation Needs: Not on file   Additional Social History:    Pain Medications: See MAR Prescriptions: See MAR Over the Counter: See MAR History of alcohol / drug use?: Yes Longest period of sobriety (when/how long): 2 years Negative Consequences of Use: Financial, Legal Withdrawal Symptoms: None Name of Substance 1: Cocaine 1 - Age of First Use: Unknown 1 - Amount (size/oz): $10 1 - Frequency: Pt recently relapsed once after being clean for 6 months 1 - Duration: 1x 1 - Last Use / Amount: 4 days ago 1 - Method of Aquiring: Illegal purchase 1- Route of Use: Not assessed Name of Substance 2: EtOH 2 - Age of First Use: Not assessed 2 - Amount (size/oz): 2-3 12-ounce beers 2 - Frequency: Daily 2 - Duration: Ongoing 2 - Last Use / Amount: Today 2 - Method of Aquiring: Purchase 2 - Route of Substance Use: Oral                Sleep: Fair  Appetite:  Fair  Current Medications:  Current Facility-Administered Medications  Medication Dose Route Frequency Provider Last Rate Last Admin   acetaminophen (TYLENOL) tablet 650 mg  650 mg Oral Q6H PRN Rozetta Nunnery, NP       alum & mag hydroxide-simeth (MAALOX/MYLANTA) 200-200-20 MG/5ML suspension 30 mL  30 mL Oral Q4H PRN Lindon Romp A, NP       bictegravir-emtricitabine-tenofovir AF (BIKTARVY) 50-200-25  MG per tablet 1 tablet  1 tablet Oral Daily Lindon Romp A, NP   1 tablet at 10/29/20 1003   DULoxetine (CYMBALTA) DR capsule 60 mg  60 mg Oral Daily Lindon Romp A, NP   60 mg at 10/29/20 1003   gabapentin (NEURONTIN) capsule 600 mg  600 mg Oral BID Lindon Romp A, NP   600 mg at 10/29/20 1004  hydrOXYzine (ATARAX/VISTARIL) tablet 25 mg  25 mg Oral TID PRN Lindon Romp A, NP       magnesium hydroxide (MILK OF MAGNESIA) suspension 30 mL  30 mL Oral Daily PRN Lindon Romp A, NP       meloxicam (MOBIC) tablet 7.5 mg  7.5 mg Oral Daily Lindon Romp A, NP   7.5 mg at 10/29/20 1003   menthol-cetylpyridinium (CEPACOL) lozenge 3 mg  1 lozenge Oral PRN Ival Bible, MD   3 mg at 10/29/20 0825   nicotine (NICODERM CQ - dosed in mg/24 hours) patch 14 mg  14 mg Transdermal Daily Ival Bible, MD       phenol (CHLORASEPTIC) mouth spray 1 spray  1 spray Mouth/Throat PRN Ival Bible, MD   1 spray at 10/29/20 1028   QUEtiapine (SEROQUEL) tablet 250 mg  250 mg Oral QHS Ival Bible, MD   250 mg at 10/28/20 2127   rosuvastatin (CRESTOR) tablet 20 mg  20 mg Oral Daily Lindon Romp A, NP   20 mg at 10/29/20 1003   traZODone (DESYREL) tablet 150 mg  150 mg Oral QHS Lindon Romp A, NP   150 mg at 10/28/20 2127   Current Outpatient Medications  Medication Sig Dispense Refill   bictegravir-emtricitabine-tenofovir AF (BIKTARVY) 50-200-25 MG TABS tablet Take 1 tablet by mouth daily. 30 tablet 6   gabapentin (NEURONTIN) 300 MG capsule Take orally 2 caps (600mg ) in the morning and 3 caps (900mg ) in the evening 150 capsule 3   meloxicam (MOBIC) 7.5 MG tablet Take 1 tablet (7.5 mg total) by mouth daily. 30 tablet 0   rosuvastatin (CRESTOR) 20 MG tablet Take 1 tablet (20 mg total) by mouth daily. 30 tablet 6   DULoxetine (CYMBALTA) 60 MG capsule Take 1 capsule (60 mg total) by mouth daily. For neuropathy 30 capsule 3   hydrOXYzine (ATARAX/VISTARIL) 25 MG tablet Take 1 tablet (25 mg total) by  mouth 3 (three) times daily as needed for anxiety. 75 tablet 0   QUEtiapine (SEROQUEL) 200 MG tablet Take 1 tablet (200 mg total) by mouth at bedtime. 30 tablet 1   traZODone (DESYREL) 150 MG tablet Take 1 tablet (150 mg total) by mouth at bedtime. 30 tablet 0    Labs  Lab Results:  Admission on 10/25/2020  Component Date Value Ref Range Status   SARS Coronavirus 2 by RT PCR 10/26/2020 NEGATIVE  NEGATIVE Final   Comment: (NOTE) SARS-CoV-2 target nucleic acids are NOT DETECTED.  The SARS-CoV-2 RNA is generally detectable in upper respiratory specimens during the acute phase of infection. The lowest concentration of SARS-CoV-2 viral copies this assay can detect is 138 copies/mL. A negative result does not preclude SARS-Cov-2 infection and should not be used as the sole basis for treatment or other patient management decisions. A negative result may occur with  improper specimen collection/handling, submission of specimen other than nasopharyngeal swab, presence of viral mutation(s) within the areas targeted by this assay, and inadequate number of viral copies(<138 copies/mL). A negative result must be combined with clinical observations, patient history, and epidemiological information. The expected result is Negative.  Fact Sheet for Patients:  EntrepreneurPulse.com.au  Fact Sheet for Healthcare Providers:  IncredibleEmployment.be  This test is no                          t yet approved or cleared by the [place] FDA and  has been authorized for detection  and/or diagnosis of SARS-CoV-2 by FDA under an Emergency Use Authorization (EUA). This EUA will remain  in effect (meaning this test can be used) for the duration of the COVID-19 declaration under Section 564(b)(1) of the Act, 21 U.S.C.section 360bbb-3(b)(1), unless the authorization is terminated  or revoked sooner.       Influenza A by PCR 10/26/2020 NEGATIVE  NEGATIVE Final    Influenza B by PCR 10/26/2020 NEGATIVE  NEGATIVE Final   Comment: (NOTE) The Xpert Xpress SARS-CoV-2/FLU/RSV plus assay is intended as an aid in the diagnosis of influenza from Nasopharyngeal swab specimens and should not be used as a sole basis for treatment. Nasal washings and aspirates are unacceptable for Xpert Xpress SARS-CoV-2/FLU/RSV testing.  Fact Sheet for Patients: EntrepreneurPulse.com.au  Fact Sheet for Healthcare Providers: IncredibleEmployment.be  This test is not yet approved or cleared by the [place] FDA and has been authorized for detection and/or diagnosis of SARS-CoV-2 by FDA under an Emergency Use Authorization (EUA). This EUA will remain in effect (meaning this test can be used) for the duration of the COVID-19 declaration under Section 564(b)(1) of the Act, 21 U.S.C. section 360bbb-3(b)(1), unless the authorization is terminated or revoked.  Performed at [facility], [place] [address], [place], [name] [address]    SARS Coronavirus 2 Ag 10/26/2020 Negative  Negative Preliminary   WBC 10/26/2020 7.7  4.0 - 10.5 K/uL Final   RBC 10/26/2020 4.58  4.22 - 5.81 MIL/uL Final   Hemoglobin 10/26/2020 14.1  13.0 - 17.0 g/dL Final   HCT 10/26/2020 41.4  39.0 - 52.0 % Final   MCV 10/26/2020 90.4  80.0 - 100.0 fL Final   MCH 10/26/2020 30.8  26.0 - 34.0 pg Final   MCHC 10/26/2020 34.1  30.0 - 36.0 g/dL Final   RDW 10/26/2020 12.6  11.5 - 15.5 % Final   Platelets 10/26/2020 218  150 - 400 K/uL Final   nRBC 10/26/2020 0.0  0.0 - 0.2 % Final   Neutrophils Relative % 10/26/2020 58  % Final   Neutro Abs 10/26/2020 4.4  1.7 - 7.7 K/uL Final   Lymphocytes Relative 10/26/2020 36  % Final   Lymphs Abs 10/26/2020 2.8  0.7 - 4.0 K/uL Final   Monocytes Relative 10/26/2020 5  % Final   Monocytes Absolute 10/26/2020 0.4  0.1 - 1.0 K/uL Final   Eosinophils Relative 10/26/2020 1  % Final   Eosinophils Absolute 10/26/2020 0.1  0.0 - 0.5  K/uL Final   Basophils Relative 10/26/2020 0  % Final   Basophils Absolute 10/26/2020 0.0  0.0 - 0.1 K/uL Final   Immature Granulocytes 10/26/2020 0  % Final   Abs Immature Granulocytes 10/26/2020 0.00  0.00 - 0.07 K/uL Final   Performed at [facility], [place] [address], [place], [name] [address]   Sodium 10/26/2020 136  135 - 145 mmol/L Final   Potassium 10/26/2020 3.8  3.5 - 5.1 mmol/L Final   Chloride 10/26/2020 105  98 - 111 mmol/L Final   CO2 10/26/2020 21 (A)  22 - 32 mmol/L Final   Glucose, Bld 10/26/2020 151 (A)  70 - 99 mg/dL Final   Glucose reference range applies only to samples taken after fasting for at least 8 hours.   BUN 10/26/2020 15  6 - 20 mg/dL Final   Creatinine, Ser 10/26/2020 1.16  0.61 - 1.24 mg/dL Final   Calcium 10/26/2020 9.7  8.9 - 10.3 mg/dL Final   Total Protein 10/26/2020 6.9  6.5 - 8.1 g/dL Final   Albumin 10/26/2020 4.1  3.5 - 5.0 g/dL Final   AST 10/26/2020 34  15 - 41 U/L Final   ALT 10/26/2020 38  0 - 44 U/L Final   Alkaline Phosphatase 10/26/2020 91  38 - 126 U/L Final   Total Bilirubin 10/26/2020 0.7  0.3 - 1.2 mg/dL Final   GFR, Estimated 10/26/2020 >60  >60 mL/min Final   Comment: (NOTE) Calculated using the CKD-EPI Creatinine Equation (2021)    Anion gap 10/26/2020 10  5 - 15 Final   Performed at [facility], [place] [address] [place], [name] [address]   Hgb A1c MFr Bld 10/26/2020 6.0 (A)  4.8 - 5.6 % Final   Comment: (NOTE) Pre diabetes:          5.7%-6.4%  Diabetes:              >6.4%  Glycemic control for   <7.0% adults with diabetes    Mean Plasma Glucose 10/26/2020 125.5  mg/dL Final   Performed at [facility], [place] [address] [place], [place] [address]   Alcohol, Ethyl (B) 10/26/2020 <10  <10 mg/dL Final   Comment: (NOTE) Lowest detectable limit for serum alcohol is 10 mg/dL.  For medical purposes only. Performed at [facility], [place] [address], [place], [place] [address]    Cholesterol 10/26/2020  153  0 - 200 mg/dL Final   Triglycerides 10/26/2020 80  <150 mg/dL Final   HDL 10/26/2020 43  >40 mg/dL Final   Total CHOL/HDL Ratio 10/26/2020 3.6  RATIO Final   VLDL 10/26/2020 16  0 - 40 mg/dL Final   LDL Cholesterol 10/26/2020 94  0 - 99 mg/dL Final   Comment:        Total Cholesterol/HDL:CHD Risk Coronary Heart Disease Risk Table                     Men   Women  1/2 Average Risk   3.4   3.3  Average Risk       5.0   4.4  2 X Average Risk   9.6   7.1  3 X Average Risk  23.4   11.0        Use the calculated Patient Ratio above and the CHD Risk Table to determine the patient's CHD Risk.        ATP III CLASSIFICATION (LDL):  <100     mg/dL   Optimal  100-129  mg/dL   Near or Above                    Optimal  130-159  mg/dL   Borderline  160-189  mg/dL   High  >190     mg/dL   Very High Performed at [place] [address] [place], [place] [address]    TSH 10/26/2020 0.512  0.350 - 4.500 uIU/mL Final   Comment: Performed by a 3rd Generation assay with a functional sensitivity of <=0.01 uIU/mL. Performed at [facility], [place] [address], [place], [name] [address]    POC Amphetamine UR 10/26/2020 None Detected  NONE DETECTED (Cut Off Level 1000 ng/mL) Final   POC Secobarbital (BAR) 10/26/2020 None Detected  NONE DETECTED (Cut Off Level 300 ng/mL) Final   POC Buprenorphine (BUP) 10/26/2020 None Detected  NONE DETECTED (Cut Off Level 10 ng/mL) Final   POC Oxazepam (BZO) 10/26/2020 Positive (A)  NONE  DETECTED (Cut Off Level 300 ng/mL) Final   POC Cocaine UR 10/26/2020 Positive (A)  NONE DETECTED (Cut Off Level 300 ng/mL) Final   POC Methamphetamine UR 10/26/2020 None Detected  NONE DETECTED (Cut Off Level 1000 ng/mL) Final   POC Morphine 10/26/2020 None Detected  NONE DETECTED (Cut Off Level 300 ng/mL) Final   POC Oxycodone UR 10/26/2020 None Detected  NONE DETECTED (Cut Off Level 100 ng/mL) Final   POC Methadone UR 10/26/2020 None Detected  NONE DETECTED (Cut Off  Level 300 ng/mL) Final   POC Marijuana UR 10/26/2020 None Detected  NONE DETECTED (Cut Off Level 50 ng/mL) Final  Admission on 08/24/2020, Discharged on 08/24/2020  Component Date Value Ref Range Status   WBC 08/24/2020 11.5 (A)  4.0 - 10.5 K/uL Final   RBC 08/24/2020 4.93  4.22 - 5.81 MIL/uL Final   Hemoglobin 08/24/2020 15.0  13.0 - 17.0 g/dL Final   HCT 08/24/2020 46.0  39.0 - 52.0 % Final   MCV 08/24/2020 93.3  80.0 - 100.0 fL Final   MCH 08/24/2020 30.4  26.0 - 34.0 pg Final   MCHC 08/24/2020 32.6  30.0 - 36.0 g/dL Final   RDW 08/24/2020 13.0  11.5 - 15.5 % Final   Platelets 08/24/2020 217  150 - 400 K/uL Final   nRBC 08/24/2020 0.0  0.0 - 0.2 % Final   Neutrophils Relative % 08/24/2020 73  % Final   Neutro Abs 08/24/2020 8.3 (A)  1.7 - 7.7 K/uL Final   Lymphocytes Relative 08/24/2020 20  % Final   Lymphs Abs 08/24/2020 2.3  0.7 - 4.0 K/uL Final   Monocytes Relative 08/24/2020 7  % Final   Monocytes Absolute 08/24/2020 0.8  0.1 - 1.0 K/uL Final   Eosinophils Relative 08/24/2020 0  % Final   Eosinophils Absolute 08/24/2020 0.0  0.0 - 0.5 K/uL Final   Basophils Relative 08/24/2020 0  % Final   Basophils Absolute 08/24/2020 0.0  0.0 - 0.1 K/uL Final   Immature Granulocytes 08/24/2020 0  % Final   Abs Immature Granulocytes 08/24/2020 0.04  0.00 - 0.07 K/uL Final   Performed at [facility], [place] [address] [place], [name] [address]   Sodium 08/24/2020 138  135 - 145 mmol/L Final   Potassium 08/24/2020 4.3  3.5 - 5.1 mmol/L Final   Chloride 08/24/2020 103  98 - 111 mmol/L Final   CO2 08/24/2020 26  22 - 32 mmol/L Final   Glucose, Bld 08/24/2020 88  70 - 99 mg/dL Final   Glucose reference range applies only to samples taken after fasting for at least 8 hours.   BUN 08/24/2020 15  6 - 20 mg/dL Final   Creatinine, Ser 08/24/2020 1.21  0.61 - 1.24 mg/dL Final   Calcium 08/24/2020 9.8  8.9 - 10.3 mg/dL Final   Total Protein 08/24/2020 8.7 (A)  6.5 - 8.1 g/dL Final    Albumin 08/24/2020 4.8  3.5 - 5.0 g/dL Final   AST 08/24/2020 49 (A)  15 - 41 U/L Final   ALT 08/24/2020 47 (A)  0 - 44 U/L Final   Alkaline Phosphatase 08/24/2020 90  38 - 126 U/L Final   Total Bilirubin 08/24/2020 0.7  0.3 - 1.2 mg/dL Final   GFR, Estimated 08/24/2020 >60  >60 mL/min Final   Comment: (NOTE) Calculated using the CKD-EPI Creatinine Equation (2021)    Anion gap 08/24/2020 9  5 - 15 Final   Performed at [facility], [address] [place], [place] [address]   Alcohol, Ethyl (B) 08/24/2020 <10  <10 mg/dL Final   Comment: (NOTE) Lowest detectable limit for serum alcohol is 10 mg/dL.  For medical purposes only. Performed at [facility], [place] [address] [place], [name] [address]    Acetaminophen (Tylenol), Serum 08/24/2020 <10 (A)  10 - 30 ug/mL Final   Comment: (NOTE) Therapeutic concentrations vary significantly. A range of 10-30 ug/mL  may be an effective concentration for many patients. However, some  are best treated at concentrations outside of this range. Acetaminophen concentrations >150 ug/mL at 4 hours after ingestion  and >50 ug/mL at 12 hours after ingestion are often associated with  toxic reactions.  Performed at [facility], [place] [address] [place], [name] [address]    Troponin I (High Sensitivity) 08/24/2020 5  <18 ng/L Final   Comment: (NOTE) Elevated high sensitivity troponin I (hsTnI) values and significant  changes across serial measurements may suggest ACS but many other  chronic and acute conditions are known to elevate hsTnI results.  Refer to the "Links" section for chest pain algorithms and additional  guidance. Performed at [facility], [place] [address], [place], [place] [address]    SARS Coronavirus 2 by RT PCR 08/24/2020 NEGATIVE  NEGATIVE Final   Comment: (NOTE) SARS-CoV-2 target nucleic acids are NOT DETECTED.  The SARS-CoV-2 RNA is generally  detectable in upper respiratory specimens during the acute phase of infection. The lowest concentration of SARS-CoV-2 viral copies this assay can detect is 138 copies/mL. A negative result does not preclude SARS-Cov-2 infection and should not be used as the sole basis for treatment or other patient management decisions. A negative result may occur with  improper specimen collection/handling, submission of specimen other than nasopharyngeal swab, presence of viral mutation(s) within the areas targeted by this assay, and inadequate number of viral copies(<138 copies/mL). A negative result must be combined with clinical observations, patient history, and epidemiological information. The expected result is Negative.  Fact Sheet for Patients:  EntrepreneurPulse.com.au  Fact Sheet for Healthcare Providers:  IncredibleEmployment.be  This test is no                          t yet approved or cleared by the [place] FDA and  has been authorized for detection and/or diagnosis of SARS-CoV-2 by FDA under an Emergency Use Authorization (EUA). This EUA will remain  in effect (meaning this test can be used) for the duration of the COVID-19 declaration under Section 564(b)(1) of the Act, 21 U.S.C.section 360bbb-3(b)(1), unless the authorization is terminated  or revoked sooner.       Influenza A by PCR 08/24/2020 NEGATIVE  NEGATIVE Final   Influenza B by PCR 08/24/2020 NEGATIVE  NEGATIVE Final   Comment: (NOTE) The Xpert Xpress SARS-CoV-2/FLU/RSV plus assay is intended as an aid in the diagnosis of influenza from Nasopharyngeal swab specimens and should not be used as a sole basis for treatment. Nasal washings and aspirates are unacceptable for Xpert Xpress SARS-CoV-2/FLU/RSV testing.  Fact Sheet for Patients: EntrepreneurPulse.com.au  Fact Sheet for Healthcare Providers: IncredibleEmployment.be  This test is not  yet approved or cleared by the [place] FDA and has been authorized for detection and/or diagnosis of SARS-CoV-2 by FDA under an Emergency Use Authorization (EUA). This EUA will remain in effect (meaning this test can be used) for the duration of the COVID-19 declaration under Section 564(b)(1) of the  Act, 21 U.S.C. section 360bbb-3(b)(1), unless the authorization is terminated or revoked.  Performed at [facility], [place] [address], [place], [place] [address]   Office Visit on 07/14/2020  Component Date Value Ref Range Status   Vitamin B-12 07/14/2020 403  200 - 1,100 pg/mL Final   Sed Rate 07/14/2020 2  0 - 20 mm/h Final   T3 Uptake 07/14/2020 29  22 - 35 % Final   T4, Total 07/14/2020 6.4  4.9 - 10.5 mcg/dL Final   Free Thyroxine Index 07/14/2020 1.9  1.4 - 3.8 Final   TSH 07/14/2020 1.59  0.40 - 4.50 mIU/L Final  Appointment on 07/06/2020  Component Date Value Ref Range Status   RPR Ser Ql 07/06/2020 NON-REACTIVE  NON-REACTIVE Final   CD4 T Cell Abs 07/06/2020 648  400 - 1,790 /uL Final   CD4 % Helper T Cell 07/06/2020 38  33 - 65 % Final   Performed at [facility], [place] [address], [place], [place] [address]   HIV 1 RNA Quant 07/06/2020 Not Detected  Copies/mL Final   HIV-1 RNA Quant, Log 07/06/2020 Not Detected  Log cps/mL Final   Comment: . Reference Range:                           Not Detected     copies/mL                           Not Detected Log copies/mL . Marland Kitchen The test was performed using Real-Time Polymerase Chain Reaction. . . Reportable Range: 20 copies/mL to 10,000,000 copies/mL (1.30 Log copies/mL to 7.00 Log copies/mL). .    Glucose, Bld 07/06/2020 95  65 - 99 mg/dL Final   Comment: .            Fasting reference interval .    BUN 07/06/2020 15  7 - 25 mg/dL Final   Creat 07/06/2020 1.06  0.70 - 1.33 mg/dL Final   Comment: For patients >54 years of age, the reference limit for Creatinine is approximately 13%  higher for people identified as African-American. .    GFR, Est Non African American 07/06/2020 79  > OR = 60 mL/min/1.69m2 Final   GFR, Est African American 07/06/2020 92  > OR = 60 mL/min/1.21m2 Final   BUN/Creatinine Ratio 18/56/3149 NOT APPLICABLE  6 - 22 (calc) Final   Sodium 07/06/2020 138  135 - 146 mmol/L Final   Potassium 07/06/2020 4.3  3.5 - 5.3 mmol/L Final   Chloride 07/06/2020 107  98 - 110 mmol/L Final   CO2 07/06/2020 24  20 - 32 mmol/L Final   Calcium 07/06/2020 9.2  8.6 - 10.3 mg/dL Final   Total Protein 07/06/2020 6.8  6.1 - 8.1 g/dL Final   Albumin 07/06/2020 4.1  3.6 - 5.1 g/dL Final   Globulin 07/06/2020 2.7  1.9 - 3.7 g/dL (calc) Final   AG Ratio 07/06/2020 1.5  1.0 - 2.5 (calc) Final   Total Bilirubin 07/06/2020 0.4  0.2 - 1.2 mg/dL Final   Alkaline phosphatase (APISO) 07/06/2020 87  35 - 144 U/L Final   AST 07/06/2020 32  10 - 35 U/L Final   ALT 07/06/2020 33  9 - 46 U/L Final   WBC 07/06/2020 4.5  3.8 - 10.8 Thousand/uL Final   RBC 07/06/2020 4.50  4.20 - 5.80 Million/uL Final   Hemoglobin 07/06/2020 13.7  13.2 -  17.1 g/dL Final   HCT 07/06/2020 41.0  38.5 - 50.0 % Final   MCV 07/06/2020 91.1  80.0 - 100.0 fL Final   MCH 07/06/2020 30.4  27.0 - 33.0 pg Final   MCHC 07/06/2020 33.4  32.0 - 36.0 g/dL Final   RDW 07/06/2020 12.9  11.0 - 15.0 % Final   Platelets 07/06/2020 163  140 - 400 Thousand/uL Final   MPV 07/06/2020 11.7  7.5 - 12.5 fL Final   Neutro Abs 07/06/2020 2,183  1,500 - 7,800 cells/uL Final   Lymphs Abs 07/06/2020 1,778  850 - 3,900 cells/uL Final   Absolute Monocytes 07/06/2020 351  200 - 950 cells/uL Final   Eosinophils Absolute 07/06/2020 171  15 - 500 cells/uL Final   Basophils Absolute 07/06/2020 18  0 - 200 cells/uL Final   Neutrophils Relative % 07/06/2020 48.5  % Final   Total Lymphocyte 07/06/2020 39.5  % Final   Monocytes Relative 07/06/2020 7.8  % Final   Eosinophils Relative 07/06/2020 3.8  % Final   Basophils Relative  07/06/2020 0.4  % Final   Neisseria Gonorrhea 07/06/2020 Negative   Final   Chlamydia 07/06/2020 Negative   Final   Comment 07/06/2020 Normal Reference [place] Chlamydia - Negative   Final   Comment 07/06/2020 Normal Reference Range Neisseria Gonorrhea - Negative   Final    Blood Alcohol level:  Lab Results  Component Value Date   ETH <10 10/26/2020   ETH <10 44/31/5400    Metabolic Disorder Labs: Lab Results  Component Value Date   HGBA1C 6.0 (H) 10/26/2020   MPG 125.5 10/26/2020   MPG 131.24 04/28/2020   Lab Results  Component Value Date   PROLACTIN 12.1 03/08/2020   PROLACTIN 2.6 (L) 11/09/2019   Lab Results  Component Value Date   CHOL 153 10/26/2020   TRIG 80 10/26/2020   HDL 43 10/26/2020   CHOLHDL 3.6 10/26/2020   VLDL 16 10/26/2020   LDLCALC 94 10/26/2020   LDLCALC 101 (H) 04/28/2020    Therapeutic Lab Levels: No results found for: LITHIUM No results found for: VALPROATE No components found for:  CBMZ  Physical Findings   AIMS    Flowsheet Row Admission (Discharged) from 04/28/2020 in [facility] Admission (Discharged) from 03/07/2020 in [facility]  AIMS Total Score 0 0      AUDIT    Flowsheet Row Admission (Discharged) from 04/28/2020 in [facility] Admission (Discharged) from 03/07/2020 in [facility] Admission (Discharged) from 12/12/2011 in [facility]  Alcohol Use Disorder Identification Test Final Score (AUDIT) 3 13 8       GAD-7    Flowsheet Row Office Visit from 03/17/2020 in [facility] Office Visit from 07/22/2019 in [facility] Office Visit from 09/03/2018 in [facility]  Total GAD-7 Score 16 19 19       PHQ2-9    Flowsheet Row ED from 10/25/2020 in [facility] Video Visit from  04/05/2020 in [facility] Office Visit from 03/17/2020 in [place] ED from 11/09/2019 in [facility] Office Visit from 07/22/2019 in [place]  PHQ-2 Total Score 5 0 1 6 4   PHQ-9 Total Score 15 -- 9 19 15       Flowsheet Row ED from  10/25/2020 in [facility] ED from 08/24/2020 in [facility] DEPT Admission (Discharged) from 04/28/2020 in [place] Low Risk High Risk Moderate Risk        Musculoskeletal  Strength & Muscle Tone: within normal limits Gait & Station: normal Patient leans: N/A  Psychiatric Specialty Exam  Presentation  General Appearance: Appropriate for Environment; Casual  Eye Contact:Good  Speech:Clear and Coherent; Normal Rate  Speech Volume:Normal  Handedness:Right   Mood and Affect  Mood:-- ("hopeful")  Affect:Appropriate; Congruent   Thought Process  Thought Processes:Coherent; Goal Directed; Linear  Descriptions of Associations:Intact  Orientation:Full (Time, Place and Person)  Thought Content:WDL; Logical  Diagnosis of Schizophrenia or Schizoaffective disorder in past: No  Duration of Psychotic Symptoms: Greater than six months   Hallucinations:Hallucinations: None Description of Auditory Hallucinations: AH of voices telling him to harm others and  himself, last night  Ideas of Reference:None  Suicidal Thoughts:Suicidal Thoughts: Yes, Passive ("less thoughts")   Homicidal Thoughts:Homicidal Thoughts: No   Sensorium  Memory:Immediate Good; Recent Good; Remote Good  Judgment:Fair  Insight:Fair   Executive Functions  Concentration:Good  Attention Span:Good  [place] of Knowledge:Good  Language:Good   Psychomotor Activity  Psychomotor Activity:Psychomotor Activity: Normal   Assets   Assets:Communication Skills; Desire for Improvement; Resilience   Sleep  Sleep:Sleep: Good   No data recorded   Physical Exam  Physical Exam Constitutional:      Appearance: Normal appearance. He is normal weight.  HENT:     Head: Normocephalic and atraumatic.  Eyes:     Extraocular Movements: Extraocular movements intact.     Conjunctiva/sclera: Conjunctivae normal.  Pulmonary:     Effort: Pulmonary effort is normal.  Neurological:     General: No focal deficit present.     Mental Status: He is alert and oriented to person, place, and time.  Psychiatric:        Attention and Perception: Attention and perception normal.        Speech: Speech normal.        Behavior: Behavior normal. Behavior is cooperative.        Thought Content: Thought content normal.   Review of Systems  Constitutional:  Negative for chills and fever.  HENT:  Positive for congestion and sore throat. Negative for hearing loss.   Eyes:  Negative for discharge and redness.  Respiratory:  Negative for cough, shortness of breath and wheezing.   Cardiovascular:  Negative for chest pain.  Gastrointestinal:  Negative for abdominal pain, nausea and vomiting.  Musculoskeletal:  Negative for myalgias.  Neurological:  Negative for headaches.  Psychiatric/Behavioral:  Positive for substance abuse. Negative for hallucinations and suicidal ideas.   Blood pressure (!) 144/83, pulse 73, temperature 98.8 F (37.1 C), temperature source Oral, resp. rate 18, SpO2 99 %. There is no height or weight on file to calculate BMI.  Treatment Plan Summary: 55 year old man with a history of schizoaffective disorder, depressive type and substance use who presented to the [facility] for suicidal thoughts and plan to jump from a parking garage on 10/18..  Patient was admitted to the [facility] for treatment on 10/18.  UDS+cocaine and benzodiazepines. Patient was restarted on his home medications upon admission to the [facility] which were continued at  the [facility].   Today patient denied SI/HI. Tolerated increased dose of seroquel well and denies AVH. Patient reporting ongoing sore throat and newly developed congestion despite lozenge  use-added chloroseptic spray and mucinex  Patient remains appropriate for treatment at the [facility] at this time for continued stabilization   Schizoaffective disorder, depressive type -continue seroquel 250 from 200  mg qhs (10/20) -continue cymbalta 60 mg daily -cotninue trazodone 150 gm qhs prn  Polysubstance abuse -UDS+cocaine and BZD -pt expresses interest in IOP for SA  Neuropathy -cotninute gabapentin 600 mg BID -cymbalta also has pain properties to assist with neuropathic pain -mobic 7.5 mg daily  HIV -continue biktarvy  HLD -continue crestor 20 mg  Sore throat/congestion -lozenges (ordered 10/20) -start chloroseptic spray (10/21) -start mucinex (10/21)  Dispo: Ongoing.  SW assisting. Likely will discharge to an [facility] on Monday or over the weekend if there is availability. Patient will need samples and scripts at time of discharge.   Ival Bible, MD 10/29/2020 12:59 PM

## 2020-10-29 NOTE — Progress Notes (Signed)
Patient out on unit and attending group.  Continues to report "sore throat".  Cepacol spray ordered.  Patient teaching done regarding new medication.  Patient able to return demonstrate use.  Patient also reported no BM X3 days.  Refused Milk of Magnesia.  "Later."  Denied SI, HI, AVH.  Rated anxiety and depression 8/10.  Continue to monitor for safety.

## 2020-10-29 NOTE — Progress Notes (Signed)
Patient sleeping.  Respirations even and unlabored.  Continue to monitor for safety.

## 2020-10-29 NOTE — Clinical Social Work Psych Note (Addendum)
CSW Update  Rainey reports he does not feel well today. He presented with a flat, depressed affect and congruent mood.   He complained of a sore throat and shared that it was challenging swallowing or talking. He reports his throat has been sore for about 3 days and that he was prescribed lozenges to help ease the pain, however he reports he did not feel that they were effective.   Dr. Serafina Mitchell, MD made aware and added additional treatment for sore throat.   Timoteo reports he could not determine if his mood had improved due to the sickness, however he did deny having any SI, HI or AVH. Kayan shared that he plans to discharge to an [place], instead of his apartment, to ensure he is in a positive and beneficial environment for recovery.   Tareek was provided a list of additional [employer]. Conner reports he has an interview Sunday via phone with a potential [employer] placement. If he does not find placement prior to Monday, Derrian will discharge back to his previous address.   Bartholomew will follow up with [facility] for outpatient medication management and therapy services. He was also provided with additional resources for potential substance abuse services, such as counseling. CSW listed walk-in hours for [facility] in [place]' chart for continuity of care.   Per Dr. Serafina Mitchell, MD It is expected for [facility] to discharge on Sunday, 10/31/2020 or Monday, 11/01/2020.   [facility] did not express any additional questions or concerns at this time.    CSW will continue to follow.    Radonna Ricker, MSW, LCSW Clinical Education officer, museum ([place]) [facility]

## 2020-10-29 NOTE — ED Notes (Signed)
Pt attended group 

## 2020-10-29 NOTE — ED Notes (Signed)
Pt given breakfast.

## 2020-10-29 NOTE — ED Notes (Signed)
Pt eating dinner

## 2020-10-29 NOTE — ED Notes (Signed)
Appears to be resting quietly , eyes closed , respirations even and unlabored , no distress noted. Will continue to monitor

## 2020-10-29 NOTE — ED Notes (Signed)
Pt eating lunch

## 2020-10-29 NOTE — Group Note (Signed)
Group Topic: Wellness  Group Date: 10/29/2020 Start Time: 1000 End Time: 1100 Facilitators: Hayes Ludwig  Department: [facility]  Number of Participants: 7  Group Focus: other wellness Treatment Modality:  Patient-Centered Therapy Interventions utilized were assignment Purpose: increase insight  Name: Dylan [name] Date of Birth: 02-Aug-1965  MR: [id number]    Level of Participation: active Quality of Participation: cooperative Interactions with others: gave feedback Mood/Affect: appropriate Triggers (if applicable): n/a Cognition: coherent/clear Progress: Minimal Response: n/a Plan: referral / recommendations  Patients Problems:  Patient Active Problem List   Diagnosis Date Noted   Substance induced mood disorder ([place]) 10/26/2020   Bipolar affective disorder, mixed, severe, with psychotic behavior ([place]) 04/28/2020   Fatty liver 03/09/2020   Schizophrenia ([place]) 03/08/2020   Generalized anxiety disorder 02/03/2020   Insomnia due to other mental disorder 02/03/2020   Lisfranc dislocation, right, initial encounter    Prediabetes 07/22/2019   Tobacco abuse 07/10/2018   ESRD needing dialysis ([place]) 03/03/2018   Schizoaffective disorder, bipolar type ([place])    Acute renal failure (ARF) ([place])    Suicidal ideations    Chronic 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 coma ([place])    AKI (acute kidney injury) ([place]) 02/19/2017   Rhabdomyolysis 02/19/2017   Suicidal overdose ([place]) 02/19/2017   Acute hepatitis 02/19/2017   Elevated LFTs    Schizoaffective disorder, depressive type ([place]) 09/05/2011   Degenerative disc disease 08/21/2011   HIV disease ([place]) 08/17/2011

## 2020-10-30 DIAGNOSIS — F411 Generalized anxiety disorder: Secondary | ICD-10-CM | POA: Diagnosis not present

## 2020-10-30 DIAGNOSIS — F1994 Other psychoactive substance use, unspecified with psychoactive substance-induced mood disorder: Secondary | ICD-10-CM | POA: Diagnosis not present

## 2020-10-30 DIAGNOSIS — F251 Schizoaffective disorder, depressive type: Secondary | ICD-10-CM | POA: Diagnosis not present

## 2020-10-30 DIAGNOSIS — F5105 Insomnia due to other mental disorder: Secondary | ICD-10-CM | POA: Diagnosis not present

## 2020-10-30 LAB — GROUP A STREP BY PCR: Group A Strep by PCR: NOT DETECTED

## 2020-10-30 NOTE — ED Provider Notes (Signed)
Behavioral Health Progress Note  Date and Time: 10/30/2020 11:03 AM Name: Dylan [name] MRN:  [id number]  Subjective:  Dylan [name], 55 y.o., male patient seen face to face by this provider, chart reviewed and discussed with Dr. Serafina Mitchell on 10/30/20.  On evaluation Dylan [name] reports, "my depression has improved my throat feels like it is getting worse".  On today's assessment patient is in sitting position in no acute distress.  He is fairly groomed and makes good eye contact.  He is calm and cooperative.  He is alert and oriented x4.  He is pleasant.  Endorses some depression but states it has improved.  His affect is dysphoric.  Denies any concerns with appetite or sleep.  He does not appear to be responding to internal/external stimuli.  Denies visual hallucinations.  Endorses auditory hallucinations states, "I do not like to talk about those".  States his voices are not command, but would not elaborate on what the voices say.  Denies homicidal ideations.  Endorses passive suicidal ideations.  States he does not have a plan, intent, or access to means.  States they are thoughts that just come and go.  Patient is tolerating medications without any adverse reactions.  He is attending and participating in group sessions. Patient's feelings and progress discussed.    Patient continues to complain of sore throat.  States it has worsened throughout the night.  He is utilizing lozenges and spray. States, states it is hard for him to swallow pills.  Throat was examined it is red and there is yellow feel around his uvula.  Strep PCR test performed, awaiting results.   Diagnosis:  Final diagnoses:  Schizoaffective disorder, depressive type ([place])  Generalized anxiety disorder  Insomnia due to other mental disorder    Total Time spent with patient: 30 minutes  Past Psychiatric History: see H&P Past Medical History:  Past Medical History:  Diagnosis Date   Anxiety    Arthritis    Bipolar 1  disorder ([place])    Colon polyps    Depression    GERD (gastroesophageal reflux disease)    Hepatitis B    Human immunodeficiency virus (HIV) ([place])    Hyperlipidemia    Hypertension    Neuromuscular disorder ([place])    neuropathy   Neuropathy    Pre-diabetes    Schizophrenia ([place])     Past Surgical History:  Procedure Laterality Date   FOOT ARTHRODESIS Right 09/17/2019   Procedure: FUSION RIGHT LISFRANC JOINT;  Surgeon: Newt Minion, MD;  Location: [facility];  Service: Orthopedics;  Laterality: Right;   FOOT ARTHRODESIS Right 09/2019   HEMORROIDECTOMY     IR FLUORO GUIDE CV LINE RIGHT  02/22/2017   IR US GUIDE VASC ACCESS RIGHT  02/22/2017   TUMOR REMOVAL     From Chest   Family History:  Family History  Problem Relation Age of Onset   CAD Mother    Diabetes Father    Colon cancer Maternal Aunt 60   Diabetes Maternal Aunt    Heart disease Maternal Uncle    Esophageal cancer Neg Hx    Rectal cancer Neg Hx    Stomach cancer Neg Hx    Family Psychiatric  History: See H&P Social History:  Social History   Substance and Sexual Activity  Alcohol Use Yes   Comment: occassional use on weekends     Social History   Substance and Sexual Activity  Drug Use Yes   Types: Cocaine, Marijuana   Comment:  last use was Friday 03/05/2020    Social History   Socioeconomic History   Marital status: Married    Spouse name: Not on file   Number of children: 1   Years of education: Not on file   Highest education level: Not on file  Occupational History   Not on file  Tobacco Use   Smoking status: Every Day    Packs/day: 0.50    Years: 15.00    Pack years: 7.50    Types: E-cigarettes, Cigarettes   Smokeless tobacco: Never   Tobacco comments:    has patches when he is ready to quit  Vaping Use   Vaping Use: Some days   Substances: Nicotine  Substance and Sexual Activity   Alcohol use: Yes    Comment: occassional use on weekends   Drug use: Yes    Types: Cocaine, Marijuana     Comment: last use was Friday 03/05/2020   Sexual activity: Yes    Partners: Female    Birth control/protection: Condom    Comment: condoms given  Other Topics Concern   Not on file  Social History Narrative   Not on file   Social Determinants of Health   Financial Resource Strain: Not on file  Food Insecurity: Not on file  Transportation Needs: Not on file  Physical Activity: Not on file  Stress: Not on file  Social Connections: Not on file   SDOH:  SDOH Screenings   Alcohol Screen: Low Risk    Last Alcohol Screening Score (AUDIT): 3  Depression (PHQ2-9): Medium Risk   PHQ-2 Score: 15  Financial Resource Strain: Not on file  Food Insecurity: Not on file  Housing: Not on file  Physical Activity: Not on file  Social Connections: Not on file  Stress: Not on file  Tobacco Use: High Risk   Smoking Tobacco Use: Every Day   Smokeless Tobacco Use: Never   Passive Exposure: Not on file  Transportation Needs: Not on file   Additional Social History:    Pain Medications: See MAR Prescriptions: See MAR Over the Counter: See MAR History of alcohol / drug use?: Yes Longest period of sobriety (when/how long): 2 years Negative Consequences of Use: Financial, Legal Withdrawal Symptoms: None Name of Substance 1: Cocaine 1 - Age of First Use: Unknown 1 - Amount (size/oz): $10 1 - Frequency: Pt recently relapsed once after being clean for 6 months 1 - Duration: 1x 1 - Last Use / Amount: 4 days ago 1 - Method of Aquiring: Illegal purchase 1- Route of Use: Not assessed Name of Substance 2: EtOH 2 - Age of First Use: Not assessed 2 - Amount (size/oz): 2-3 12-ounce beers 2 - Frequency: Daily 2 - Duration: Ongoing 2 - Last Use / Amount: Today 2 - Method of Aquiring: Purchase 2 - Route of Substance Use: Oral      Sleep: Good  Appetite:  Good  Current Medications:  Current Facility-Administered Medications  Medication Dose Route Frequency Provider Last Rate Last Admin    acetaminophen (TYLENOL) tablet 650 mg  650 mg Oral Q6H PRN Rozetta Nunnery, NP       alum & mag hydroxide-simeth (MAALOX/MYLANTA) 200-200-20 MG/5ML suspension 30 mL  30 mL Oral Q4H PRN Lindon Romp A, NP       bictegravir-emtricitabine-tenofovir AF (BIKTARVY) 50-200-25 MG per tablet 1 tablet  1 tablet Oral Daily Lindon Romp A, NP   1 tablet at 10/29/20 1003   DULoxetine (CYMBALTA) DR capsule 60 mg  60 mg Oral Daily Lindon Romp A, NP   60 mg at 10/29/20 1003   gabapentin (NEURONTIN) capsule 600 mg  600 mg Oral BID Lindon Romp A, NP   600 mg at 10/29/20 2154   guaiFENesin (MUCINEX) 12 hr tablet 600 mg  600 mg Oral BID PRN Ival Bible, MD   600 mg at 10/29/20 1405   hydrOXYzine (ATARAX/VISTARIL) tablet 25 mg  25 mg Oral TID PRN Rozetta Nunnery, NP       magnesium hydroxide (MILK OF MAGNESIA) suspension 30 mL  30 mL Oral Daily PRN Lindon Romp A, NP       meloxicam (MOBIC) tablet 7.5 mg  7.5 mg Oral Daily Lindon Romp A, NP   7.5 mg at 10/29/20 1003   menthol-cetylpyridinium (CEPACOL) lozenge 3 mg  1 lozenge Oral PRN Ival Bible, MD   3 mg at 10/30/20 1023   nicotine (NICODERM CQ - dosed in mg/24 hours) patch 14 mg  14 mg Transdermal Daily Ival Bible, MD       phenol (CHLORASEPTIC) mouth spray 1 spray  1 spray Mouth/Throat PRN Ival Bible, MD   1 spray at 10/30/20 1014   QUEtiapine (SEROQUEL) tablet 250 mg  250 mg Oral QHS Ival Bible, MD   250 mg at 10/29/20 2153   rosuvastatin (CRESTOR) tablet 20 mg  20 mg Oral Daily Lindon Romp A, NP   20 mg at 10/29/20 1003   traZODone (DESYREL) tablet 150 mg  150 mg Oral QHS Lindon Romp A, NP   150 mg at 10/29/20 2154   Current Outpatient Medications  Medication Sig Dispense Refill   bictegravir-emtricitabine-tenofovir AF (BIKTARVY) 50-200-25 MG TABS tablet Take 1 tablet by mouth daily. 30 tablet 6   gabapentin (NEURONTIN) 300 MG capsule Take orally 2 caps (600mg ) in the morning and 3 caps (900mg ) in the evening  150 capsule 3   meloxicam (MOBIC) 7.5 MG tablet Take 1 tablet (7.5 mg total) by mouth daily. 30 tablet 0   rosuvastatin (CRESTOR) 20 MG tablet Take 1 tablet (20 mg total) by mouth daily. 30 tablet 6   DULoxetine (CYMBALTA) 60 MG capsule Take 1 capsule (60 mg total) by mouth daily. For neuropathy 30 capsule 3   hydrOXYzine (ATARAX/VISTARIL) 25 MG tablet Take 1 tablet (25 mg total) by mouth 3 (three) times daily as needed for anxiety. 75 tablet 0   QUEtiapine (SEROQUEL) 200 MG tablet Take 1 tablet (200 mg total) by mouth at bedtime. 30 tablet 1   traZODone (DESYREL) 150 MG tablet Take 1 tablet (150 mg total) by mouth at bedtime. 30 tablet 0    Labs  Lab Results:  Admission on 10/25/2020  Component Date Value Ref Range Status   SARS Coronavirus 2 by RT PCR 10/26/2020 NEGATIVE  NEGATIVE Final   Comment: (NOTE) SARS-CoV-2 target nucleic acids are NOT DETECTED.  The SARS-CoV-2 RNA is generally detectable in upper respiratory specimens during the acute phase of infection. The lowest concentration of SARS-CoV-2 viral copies this assay can detect is 138 copies/mL. A negative result does not preclude SARS-Cov-2 infection and should not be used as the sole basis for treatment or other patient management decisions. A negative result may occur with  improper specimen collection/handling, submission of specimen other than nasopharyngeal swab, presence of viral mutation(s) within the areas targeted by this assay, and inadequate number of viral copies(<138 copies/mL). A negative result must be combined with clinical observations, patient history, and epidemiological information. The  expected result is Negative.  Fact Sheet for Patients:  EntrepreneurPulse.com.au  Fact Sheet for Healthcare Providers:  IncredibleEmployment.be  This test is no                          t yet approved or cleared by the [place] FDA and  has been authorized for detection and/or  diagnosis of SARS-CoV-2 by FDA under an Emergency Use Authorization (EUA). This EUA will remain  in effect (meaning this test can be used) for the duration of the COVID-19 declaration under Section 564(b)(1) of the Act, 21 U.S.C.section 360bbb-3(b)(1), unless the authorization is terminated  or revoked sooner.       Influenza A by PCR 10/26/2020 NEGATIVE  NEGATIVE Final   Influenza B by PCR 10/26/2020 NEGATIVE  NEGATIVE Final   Comment: (NOTE) The Xpert Xpress SARS-CoV-2/FLU/RSV plus assay is intended as an aid in the diagnosis of influenza from Nasopharyngeal swab specimens and should not be used as a sole basis for treatment. Nasal washings and aspirates are unacceptable for Xpert Xpress SARS-CoV-2/FLU/RSV testing.  Fact Sheet for Patients: EntrepreneurPulse.com.au  Fact Sheet for Healthcare Providers: IncredibleEmployment.be  This test is not yet approved or cleared by the [place] FDA and has been authorized for detection and/or diagnosis of SARS-CoV-2 by FDA under an Emergency Use Authorization (EUA). This EUA will remain in effect (meaning this test can be used) for the duration of the COVID-19 declaration under Section 564(b)(1) of the Act, 21 U.S.C. section 360bbb-3(b)(1), unless the authorization is terminated or revoked.  Performed at [facility], [place] [address] [place], [name] [address]    SARS Coronavirus 2 Ag 10/26/2020 Negative  Negative Preliminary   WBC 10/26/2020 7.7  4.0 - 10.5 K/uL Final   RBC 10/26/2020 4.58  4.22 - 5.81 MIL/uL Final   Hemoglobin 10/26/2020 14.1  13.0 - 17.0 g/dL Final   HCT 10/26/2020 41.4  39.0 - 52.0 % Final   MCV 10/26/2020 90.4  80.0 - 100.0 fL Final   MCH 10/26/2020 30.8  26.0 - 34.0 pg Final   MCHC 10/26/2020 34.1  30.0 - 36.0 g/dL Final   RDW 10/26/2020 12.6  11.5 - 15.5 % Final   Platelets 10/26/2020 218  150 - 400 K/uL Final   nRBC 10/26/2020 0.0  0.0 - 0.2 % Final    Neutrophils Relative % 10/26/2020 58  % Final   Neutro Abs 10/26/2020 4.4  1.7 - 7.7 K/uL Final   Lymphocytes Relative 10/26/2020 36  % Final   Lymphs Abs 10/26/2020 2.8  0.7 - 4.0 K/uL Final   Monocytes Relative 10/26/2020 5  % Final   Monocytes Absolute 10/26/2020 0.4  0.1 - 1.0 K/uL Final   Eosinophils Relative 10/26/2020 1  % Final   Eosinophils Absolute 10/26/2020 0.1  0.0 - 0.5 K/uL Final   Basophils Relative 10/26/2020 0  % Final   Basophils Absolute 10/26/2020 0.0  0.0 - 0.1 K/uL Final   Immature Granulocytes 10/26/2020 0  % Final   Abs Immature Granulocytes 10/26/2020 0.00  0.00 - 0.07 K/uL Final   Performed at [facility], [place] [address] [place], [name] [address]   Sodium 10/26/2020 136  135 - 145 mmol/L Final   Potassium 10/26/2020 3.8  3.5 - 5.1 mmol/L Final   Chloride 10/26/2020 105  98 - 111 mmol/L Final   CO2 10/26/2020 21 (A)  22 - 32 mmol/L Final   Glucose, Bld 10/26/2020  151 (A)  70 - 99 mg/dL Final   Glucose reference range applies only to samples taken after fasting for at least 8 hours.   BUN 10/26/2020 15  6 - 20 mg/dL Final   Creatinine, Ser 10/26/2020 1.16  0.61 - 1.24 mg/dL Final   Calcium 10/26/2020 9.7  8.9 - 10.3 mg/dL Final   Total Protein 10/26/2020 6.9  6.5 - 8.1 g/dL Final   Albumin 10/26/2020 4.1  3.5 - 5.0 g/dL Final   AST 10/26/2020 34  15 - 41 U/L Final   ALT 10/26/2020 38  0 - 44 U/L Final   Alkaline Phosphatase 10/26/2020 91  38 - 126 U/L Final   Total Bilirubin 10/26/2020 0.7  0.3 - 1.2 mg/dL Final   GFR, Estimated 10/26/2020 >60  >60 mL/min Final   Comment: (NOTE) Calculated using the CKD-EPI Creatinine Equation (2021)    Anion gap 10/26/2020 10  5 - 15 Final   Performed at [facility] [address], [place], [name] [address]   Hgb A1c MFr Bld 10/26/2020 6.0 (A)  4.8 - 5.6 % Final   Comment: (NOTE) Pre diabetes:          5.7%-6.4%  Diabetes:              >6.4%  Glycemic control for   <7.0% adults with diabetes     Mean Plasma Glucose 10/26/2020 125.5  mg/dL Final   Performed at [facility], [place] [address], [place], [place] [address]   Alcohol, Ethyl (B) 10/26/2020 <10  <10 mg/dL Final   Comment: (NOTE) Lowest detectable limit for serum alcohol is 10 mg/dL.  For medical purposes only. Performed at [facility], [place] [address] [place], [place] [address]    Cholesterol 10/26/2020 153  0 - 200 mg/dL Final   Triglycerides 10/26/2020 80  <150 mg/dL Final   HDL 10/26/2020 43  >40 mg/dL Final   Total CHOL/HDL Ratio 10/26/2020 3.6  RATIO Final   VLDL 10/26/2020 16  0 - 40 mg/dL Final   LDL Cholesterol 10/26/2020 94  0 - 99 mg/dL Final   Comment:        Total Cholesterol/HDL:CHD Risk Coronary Heart Disease Risk Table                     Men   Women  1/2 Average Risk   3.4   3.3  Average Risk       5.0   4.4  2 X Average Risk   9.6   7.1  3 X Average Risk  23.4   11.0        Use the calculated Patient Ratio above and the CHD Risk Table to determine the patient's CHD Risk.        ATP III CLASSIFICATION (LDL):  <100     mg/dL   Optimal  100-129  mg/dL   Near or Above                    Optimal  130-159  mg/dL   Borderline  160-189  mg/dL   High  >190     mg/dL   Very High Performed at [place] [address] [place],  [address]    TSH 10/26/2020 0.512  0.350 - 4.500 uIU/mL Final   Comment: Performed by a 3rd Generation assay with a functional sensitivity of <=0.01 uIU/mL. Performed at [facility], [place]  [address], [place], [name] [address]    POC Amphetamine UR 10/26/2020 None Detected  NONE DETECTED (Cut Off Level 1000 ng/mL) Final   POC Secobarbital (BAR) 10/26/2020 None Detected  NONE DETECTED (Cut Off Level 300 ng/mL) Final   POC Buprenorphine (BUP) 10/26/2020 None Detected  NONE DETECTED (Cut Off Level 10 ng/mL) Final   POC Oxazepam (BZO) 10/26/2020 Positive (A)  NONE DETECTED (Cut Off Level 300 ng/mL) Final   POC Cocaine UR 10/26/2020 Positive (A)   NONE DETECTED (Cut Off Level 300 ng/mL) Final   POC Methamphetamine UR 10/26/2020 None Detected  NONE DETECTED (Cut Off Level 1000 ng/mL) Final   POC Morphine 10/26/2020 None Detected  NONE DETECTED (Cut Off Level 300 ng/mL) Final   POC Oxycodone UR 10/26/2020 None Detected  NONE DETECTED (Cut Off Level 100 ng/mL) Final   POC Methadone UR 10/26/2020 None Detected  NONE DETECTED (Cut Off Level 300 ng/mL) Final   POC Marijuana UR 10/26/2020 None Detected  NONE DETECTED (Cut Off Level 50 ng/mL) Final  Admission on 08/24/2020, Discharged on 08/24/2020  Component Date Value Ref Range Status   WBC 08/24/2020 11.5 (A)  4.0 - 10.5 K/uL Final   RBC 08/24/2020 4.93  4.22 - 5.81 MIL/uL Final   Hemoglobin 08/24/2020 15.0  13.0 - 17.0 g/dL Final   HCT 08/24/2020 46.0  39.0 - 52.0 % Final   MCV 08/24/2020 93.3  80.0 - 100.0 fL Final   MCH 08/24/2020 30.4  26.0 - 34.0 pg Final   MCHC 08/24/2020 32.6  30.0 - 36.0 g/dL Final   RDW 08/24/2020 13.0  11.5 - 15.5 % Final   Platelets 08/24/2020 217  150 - 400 K/uL Final   nRBC 08/24/2020 0.0  0.0 - 0.2 % Final   Neutrophils Relative % 08/24/2020 73  % Final   Neutro Abs 08/24/2020 8.3 (A)  1.7 - 7.7 K/uL Final   Lymphocytes Relative 08/24/2020 20  % Final   Lymphs Abs 08/24/2020 2.3  0.7 - 4.0 K/uL Final   Monocytes Relative 08/24/2020 7  % Final   Monocytes Absolute 08/24/2020 0.8  0.1 - 1.0 K/uL Final   Eosinophils Relative 08/24/2020 0  % Final   Eosinophils Absolute 08/24/2020 0.0  0.0 - 0.5 K/uL Final   Basophils Relative 08/24/2020 0  % Final   Basophils Absolute 08/24/2020 0.0  0.0 - 0.1 K/uL Final   Immature Granulocytes 08/24/2020 0  % Final   Abs Immature Granulocytes 08/24/2020 0.04  0.00 - 0.07 K/uL Final   Performed at [facility], [place] [address] [place], [name] [address]   Sodium 08/24/2020 138  135 - 145 mmol/L Final   Potassium 08/24/2020 4.3  3.5 - 5.1 mmol/L Final   Chloride 08/24/2020 103  98 - 111 mmol/L Final    CO2 08/24/2020 26  22 - 32 mmol/L Final   Glucose, Bld 08/24/2020 88  70 - 99 mg/dL Final   Glucose reference range applies only to samples taken after fasting for at least 8 hours.   BUN 08/24/2020 15  6 - 20 mg/dL Final   Creatinine, Ser 08/24/2020 1.21  0.61 - 1.24 mg/dL Final   Calcium 08/24/2020 9.8  8.9 - 10.3 mg/dL Final   Total Protein 08/24/2020 8.7 (A)  6.5 - 8.1 g/dL Final   Albumin 08/24/2020 4.8  3.5 - 5.0 g/dL Final   AST 08/24/2020 49 (A)  15 - 41 U/L Final   ALT 08/24/2020 47 (A)  0 - 44 U/L Final  Alkaline Phosphatase 08/24/2020 90  38 - 126 U/L Final   Total Bilirubin 08/24/2020 0.7  0.3 - 1.2 mg/dL Final   GFR, Estimated 08/24/2020 >60  >60 mL/min Final   Comment: (NOTE) Calculated using the CKD-EPI Creatinine Equation (2021)    Anion gap 08/24/2020 9  5 - 15 Final   Performed at [facility], [place] [address], [place], [place] [address]   Alcohol, Ethyl (B) 08/24/2020 <10  <10 mg/dL Final   Comment: (NOTE) Lowest detectable limit for serum alcohol is 10 mg/dL.  For medical purposes only. Performed at [facility], [place] [address], [place], [name] [address]    Acetaminophen (Tylenol), Serum 08/24/2020 <10 (A)  10 - 30 ug/mL Final   Comment: (NOTE) Therapeutic concentrations vary significantly. A range of 10-30 ug/mL  may be an effective concentration for many patients. However, some  are best treated at concentrations outside of this range. Acetaminophen concentrations >150 ug/mL at 4 hours after ingestion  and >50 ug/mL at 12 hours after ingestion are often associated with  toxic reactions.  Performed at [facility], [place] [address], [place], [name] [address]    Troponin I (High Sensitivity) 08/24/2020 5  <18 ng/L Final   Comment: (NOTE) Elevated high sensitivity troponin I (hsTnI) values and significant  changes across serial measurements may suggest ACS but many other  chronic and acute conditions  are known to elevate hsTnI results.  Refer to the "Links" section for chest pain algorithms and additional  guidance. Performed at [facility], [place] [address] [place], [place] [address]    SARS Coronavirus 2 by RT PCR 08/24/2020 NEGATIVE  NEGATIVE Final   Comment: (NOTE) SARS-CoV-2 target nucleic acids are NOT DETECTED.  The SARS-CoV-2 RNA is generally detectable in upper respiratory specimens during the acute phase of infection. The lowest concentration of SARS-CoV-2 viral copies this assay can detect is 138 copies/mL. A negative result does not preclude SARS-Cov-2 infection and should not be used as the sole basis for treatment or other patient management decisions. A negative result may occur with  improper specimen collection/handling, submission of specimen other than nasopharyngeal swab, presence of viral mutation(s) within the areas targeted by this assay, and inadequate number of viral copies(<138 copies/mL). A negative result must be combined with clinical observations, patient history, and epidemiological information. The expected result is Negative.  Fact Sheet for Patients:  EntrepreneurPulse.com.au  Fact Sheet for Healthcare Providers:  IncredibleEmployment.be  This test is no                          t yet approved or cleared by the [place] FDA and  has been authorized for detection and/or diagnosis of SARS-CoV-2 by FDA under an Emergency Use Authorization (EUA). This EUA will remain  in effect (meaning this test can be used) for the duration of the COVID-19 declaration under Section 564(b)(1) of the Act, 21 U.S.C.section 360bbb-3(b)(1), unless the authorization is terminated  or revoked sooner.       Influenza A by PCR 08/24/2020 NEGATIVE  NEGATIVE Final   Influenza B by PCR 08/24/2020 NEGATIVE  NEGATIVE Final   Comment: (NOTE) The Xpert Xpress SARS-CoV-2/FLU/RSV plus assay is intended as an  aid in the diagnosis of influenza from Nasopharyngeal swab specimens and should not be used as a sole basis for treatment. Nasal washings and aspirates are unacceptable for Xpert Xpress SARS-CoV-2/FLU/RSV testing.  Fact Sheet for Patients:  EntrepreneurPulse.com.au  Fact Sheet for Healthcare Providers: IncredibleEmployment.be  This test is not yet approved or cleared by the [place] FDA and has been authorized for detection and/or diagnosis of SARS-CoV-2 by FDA under an Emergency Use Authorization (EUA). This EUA will remain in effect (meaning this test can be used) for the duration of the COVID-19 declaration under Section 564(b)(1) of the Act, 21 U.S.C. section 360bbb-3(b)(1), unless the authorization is terminated or revoked.  Performed at [facility], [place] [address] [place], [place] [address]   Office Visit on 07/14/2020  Component Date Value Ref Range Status   Vitamin B-12 07/14/2020 403  200 - 1,100 pg/mL Final   Sed Rate 07/14/2020 2  0 - 20 mm/h Final   T3 Uptake 07/14/2020 29  22 - 35 % Final   T4, Total 07/14/2020 6.4  4.9 - 10.5 mcg/dL Final   Free Thyroxine Index 07/14/2020 1.9  1.4 - 3.8 Final   TSH 07/14/2020 1.59  0.40 - 4.50 mIU/L Final  Appointment on 07/06/2020  Component Date Value Ref Range Status   RPR Ser Ql 07/06/2020 NON-REACTIVE  NON-REACTIVE Final   CD4 T Cell Abs 07/06/2020 648  400 - 1,790 /uL Final   CD4 % Helper T Cell 07/06/2020 38  33 - 65 % Final   Performed at [facility], [place] [address], [place], [place] [address]   HIV 1 RNA Quant 07/06/2020 Not Detected  Copies/mL Final   HIV-1 RNA Quant, Log 07/06/2020 Not Detected  Log cps/mL Final   Comment: . Reference Range:                           Not Detected     copies/mL                           Not Detected Log copies/mL . Marland Kitchen The test was performed using Real-Time Polymerase Chain Reaction. . . Reportable  Range: 20 copies/mL to 10,000,000 copies/mL (1.30 Log copies/mL to 7.00 Log copies/mL). .    Glucose, Bld 07/06/2020 95  65 - 99 mg/dL Final   Comment: .            Fasting reference interval .    BUN 07/06/2020 15  7 - 25 mg/dL Final   Creat 07/06/2020 1.06  0.70 - 1.33 mg/dL Final   Comment: For patients >41 years of age, the reference limit for Creatinine is approximately 13% higher for people identified as African-American. .    GFR, Est Non African American 07/06/2020 79  > OR = 60 mL/min/1.106m2 Final   GFR, Est African American 07/06/2020 92  > OR = 60 mL/min/1.32m2 Final   BUN/Creatinine Ratio 07/05/9483 NOT APPLICABLE  6 - 22 (calc) Final   Sodium 07/06/2020 138  135 - 146 mmol/L Final   Potassium 07/06/2020 4.3  3.5 - 5.3 mmol/L Final   Chloride 07/06/2020 107  98 - 110 mmol/L Final   CO2 07/06/2020 24  20 - 32 mmol/L Final   Calcium 07/06/2020 9.2  8.6 - 10.3 mg/dL Final   Total Protein 07/06/2020 6.8  6.1 - 8.1 g/dL Final   Albumin 07/06/2020 4.1  3.6 - 5.1 g/dL Final   Globulin 07/06/2020 2.7  1.9 - 3.7 g/dL (calc) Final   AG Ratio 07/06/2020 1.5  1.0 - 2.5 (calc) Final   Total Bilirubin 07/06/2020 0.4  0.2 - 1.2 mg/dL Final  Alkaline phosphatase (APISO) 07/06/2020 87  35 - 144 U/L Final   AST 07/06/2020 32  10 - 35 U/L Final   ALT 07/06/2020 33  9 - 46 U/L Final   WBC 07/06/2020 4.5  3.8 - 10.8 Thousand/uL Final   RBC 07/06/2020 4.50  4.20 - 5.80 Million/uL Final   Hemoglobin 07/06/2020 13.7  13.2 - 17.1 g/dL Final   HCT 07/06/2020 41.0  38.5 - 50.0 % Final   MCV 07/06/2020 91.1  80.0 - 100.0 fL Final   MCH 07/06/2020 30.4  27.0 - 33.0 pg Final   MCHC 07/06/2020 33.4  32.0 - 36.0 g/dL Final   RDW 07/06/2020 12.9  11.0 - 15.0 % Final   Platelets 07/06/2020 163  140 - 400 Thousand/uL Final   MPV 07/06/2020 11.7  7.5 - 12.5 fL Final   Neutro Abs 07/06/2020 2,183  1,500 - 7,800 cells/uL Final   Lymphs Abs 07/06/2020 1,778  850 - 3,900 cells/uL Final   Absolute  Monocytes 07/06/2020 351  200 - 950 cells/uL Final   Eosinophils Absolute 07/06/2020 171  15 - 500 cells/uL Final   Basophils Absolute 07/06/2020 18  0 - 200 cells/uL Final   Neutrophils Relative % 07/06/2020 48.5  % Final   Total Lymphocyte 07/06/2020 39.5  % Final   Monocytes Relative 07/06/2020 7.8  % Final   Eosinophils Relative 07/06/2020 3.8  % Final   Basophils Relative 07/06/2020 0.4  % Final   Neisseria Gonorrhea 07/06/2020 Negative   Final   Chlamydia 07/06/2020 Negative   Final   Comment 07/06/2020 Normal Reference [place] Chlamydia - Negative   Final   Comment 07/06/2020 Normal Reference Range Neisseria Gonorrhea - Negative   Final    Blood Alcohol level:  Lab Results  Component Value Date   ETH <10 10/26/2020   ETH <10 16/60/6301    Metabolic Disorder Labs: Lab Results  Component Value Date   HGBA1C 6.0 (H) 10/26/2020   MPG 125.5 10/26/2020   MPG 131.24 04/28/2020   Lab Results  Component Value Date   PROLACTIN 12.1 03/08/2020   PROLACTIN 2.6 (L) 11/09/2019   Lab Results  Component Value Date   CHOL 153 10/26/2020   TRIG 80 10/26/2020   HDL 43 10/26/2020   CHOLHDL 3.6 10/26/2020   VLDL 16 10/26/2020   LDLCALC 94 10/26/2020   LDLCALC 101 (H) 04/28/2020    Therapeutic Lab Levels: No results found for: LITHIUM No results found for: VALPROATE No components found for:  CBMZ  Physical Findings   AIMS    Flowsheet Row Admission (Discharged) from 04/28/2020 in [facility] Admission (Discharged) from 03/07/2020 in [facility]  AIMS Total Score 0 0      AUDIT    Flowsheet Row Admission (Discharged) from 04/28/2020 in [facility] Admission (Discharged) from 03/07/2020 in [facility] Admission (Discharged) from 12/12/2011 in [facility]  Alcohol Use Disorder Identification Test Final Score (AUDIT) 3 13 8        GAD-7    Flowsheet Row Office Visit from 03/17/2020 in [facility] Office Visit from 07/22/2019 in [facility] Office Visit from 09/03/2018 in [facility]  Total GAD-7 Score 16 19 19       PHQ2-9    [place] ED from 10/25/2020 in [facility] Video Visit from 04/05/2020 in [place]  [place] for Infectious Disease Office Visit from 03/17/2020 in [facility] ED from 11/09/2019 in [facility] Office Visit from 07/22/2019 in [facility]  PHQ-2 Total Score 5 0 1 6 4   PHQ-9 Total Score 15 -- 9 19 15       Flowsheet Row ED from 10/25/2020 in [facility] ED from 08/24/2020 in [facility] DEPT Admission (Discharged) from 04/28/2020 in [facility] CATEGORY Low Risk High Risk Moderate Risk        Musculoskeletal  Strength & Muscle Tone: within normal limits Gait & Station: normal Patient leans: N/A  Psychiatric Specialty Exam  Presentation  General Appearance: Appropriate for Environment; Fairly Groomed  Eye Contact:Good  Speech:Clear and Coherent; Normal Rate  Speech Volume:Normal  Handedness:Right   Mood and Affect  Mood:Depressed  Affect:Appropriate; Congruent   Thought Process  Thought Processes:Coherent  Descriptions of Associations:Intact  Orientation:Full (Time, Place and Person)  Thought Content:Logical  Diagnosis of Schizophrenia or Schizoaffective disorder in past: No  Duration of Psychotic Symptoms: Greater than six months   Hallucinations:Hallucinations: Auditory Description of Auditory Hallucinations: hears voices, states they are better, will not state what voices are saying  Ideas of Reference:None  Suicidal Thoughts:Suicidal Thoughts: Yes,  Passive SI Passive Intent and/or Plan: Without Intent; Without Plan; Without Means to Carry Out  Homicidal Thoughts:Homicidal Thoughts: No   Sensorium  Memory:Immediate Good; Recent Good; Remote Good  Judgment:Fair  Insight:Fair   Executive Functions  Concentration:Good  Attention Span:Good  Recall:Good  Fund of Knowledge:Good  Language:Good   Psychomotor Activity  Psychomotor Activity:Psychomotor Activity: Normal   Assets  Assets:Communication Skills; Desire for Improvement; Leisure Time; Physical Health   Sleep  Sleep:Sleep: Good Number of Hours of Sleep: 8   No data recorded  Physical Exam  Physical Exam Vitals and nursing note reviewed.  Constitutional:      Appearance: He is well-developed.  HENT:     Head: Normocephalic and atraumatic.  Eyes:     General:        Right eye: No discharge.        Left eye: No discharge.     Conjunctiva/sclera: Conjunctivae normal.  Cardiovascular:     Rate and Rhythm: Normal rate.     Heart sounds: No murmur heard. Pulmonary:     Effort: Pulmonary effort is normal. No respiratory distress.  Musculoskeletal:        General: Normal range of motion.     Cervical back: Normal range of motion.  Skin:    Coloration: Skin is not jaundiced or pale.  Neurological:     Mental Status: He is alert and oriented to person, place, and time.  Psychiatric:        Attention and Perception: Attention and perception normal.        Mood and Affect: Mood is depressed.        Speech: Speech normal.        Behavior: Behavior is cooperative.        Thought Content: Thought content includes suicidal ideation. Thought content does not include suicidal plan.        Cognition and Memory: Cognition normal.        Judgment: Judgment is impulsive.   Review of Systems  Constitutional: Negative.  Negative for fever.  HENT:  Positive for sore throat.   Eyes: Negative.   Respiratory: Negative.  Negative for cough.  Cardiovascular:  Negative.  Negative for chest pain.  Gastrointestinal: Negative.   Musculoskeletal: Negative.   Skin: Negative.   Neurological: Negative.   Psychiatric/Behavioral:  Positive for depression and suicidal ideas.   Blood pressure 106/66, pulse (!) 18, temperature (!) 97.1 F (36.2 C), temperature source Oral, resp. rate 18, SpO2 98 %. There is no height or weight on file to calculate BMI.  Treatment Plan Summary: Daily contact with patient to assess and evaluate symptoms and progress in treatment and Medication management  Disposition ongoing.  Continue to monitor patient.  Patient has a interview with [facility] on 10/31/2020 at 1800.  Likely discharge either 10/23 or 10/24.  Patient complains of sore throat with redness and yellow film on uvula-strep test performed- waiting results.   No medication changes at this time.  Lab work and chart reviewed. Test Revonda Humphrey, NP 10/30/2020 11:03 AM

## 2020-10-30 NOTE — Progress Notes (Signed)
Throat swab collected and DASH called.

## 2020-10-30 NOTE — ED Notes (Signed)
Pt asleep in bed. Respirations even and unlabored with no signs of acute distress. Will continue to monitor for safety.

## 2020-10-30 NOTE — Progress Notes (Signed)
Patient complaining of sore throat worse than yesterday.  Patient did not want to take AM medications at this time.  Given Cepacol per eMAR.  Throat reddened, swollen, and yellow exudate noted on uvula.  Provider notified.

## 2020-10-30 NOTE — ED Notes (Signed)
Pt given snack. 

## 2020-10-30 NOTE — ED Notes (Signed)
Pt eating lunch

## 2020-10-30 NOTE — ED Notes (Signed)
Pt eating breakfast 

## 2020-10-30 NOTE — Group Note (Signed)
Group Topic: Social Support  Group Date: 10/30/2020 Start Time: 1093 End Time: 1145 Facilitators: Hayes Ludwig  Department: [facility]  Number of Participants: 5  Group Focus: other social support Treatment Modality:  Patient-Centered Therapy Interventions utilized were support Purpose: Identify and build support system  Name: Dylan [name] Date of Birth: Jun 01, 1965  MR: [id number]    Level of Participation: active Quality of Participation: attentive Interactions with others: gave feedback Mood/Affect: positive Triggers (if applicable): n/a Cognition: coherent/clear Progress: Gaining insight Response: n/a Plan: patient will be encouraged to continue to utilize his support systems.  Patients Problems:  Patient Active Problem List   Diagnosis Date Noted   Substance induced mood disorder ([place]) 10/26/2020   Bipolar affective disorder, mixed, severe, with psychotic behavior ([place]) 04/28/2020   Fatty liver 03/09/2020   Schizophrenia ([place]) 03/08/2020   Generalized anxiety disorder 02/03/2020   Insomnia due to other mental disorder 02/03/2020   Lisfranc dislocation, right, initial encounter    Prediabetes 07/22/2019   Tobacco abuse 07/10/2018   ESRD needing dialysis ([place]) 03/03/2018   Schizoaffective disorder, bipolar type ([place])    Acute renal failure (ARF) ([place])    Suicidal ideations    Chronic 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 coma ([place])    AKI (acute kidney injury) ([place]) 02/19/2017   Rhabdomyolysis 02/19/2017   Suicidal overdose ([place]) 02/19/2017   Acute hepatitis 02/19/2017   Elevated LFTs    Schizoaffective disorder, depressive type ([place]) 09/05/2011   Degenerative disc disease 08/21/2011   HIV disease ([place]) 08/17/2011

## 2020-10-30 NOTE — ED Notes (Addendum)
Pt sitting in [facility] dining room watching TV during shift assessment. Calm and cooperative and denies SI, HI, and AVH. Pt A&Ox4 with no signs of acute distress. Pt requested and received shower shoes/supplies for cleaning self. Will continue to monitor for safety.

## 2020-10-30 NOTE — ED Notes (Signed)
Dylan [name] is currently sleeping no distress was noted respirations 16 and easy.

## 2020-10-31 DIAGNOSIS — F251 Schizoaffective disorder, depressive type: Secondary | ICD-10-CM | POA: Diagnosis not present

## 2020-10-31 DIAGNOSIS — F411 Generalized anxiety disorder: Secondary | ICD-10-CM | POA: Diagnosis not present

## 2020-10-31 DIAGNOSIS — F1994 Other psychoactive substance use, unspecified with psychoactive substance-induced mood disorder: Secondary | ICD-10-CM | POA: Diagnosis not present

## 2020-10-31 DIAGNOSIS — F5105 Insomnia due to other mental disorder: Secondary | ICD-10-CM | POA: Diagnosis not present

## 2020-10-31 MED ORDER — GUAIFENESIN ER 600 MG PO TB12: 600.0000 mg | ORAL_TABLET | Freq: Two times a day (BID) | ORAL | 0 refills | Status: DC | PRN

## 2020-10-31 MED ORDER — MENTHOL 3 MG MT LOZG: 1.0000 | LOZENGE | OROMUCOSAL | 12 refills | Status: DC | PRN

## 2020-10-31 MED ORDER — GABAPENTIN 300 MG PO CAPS: 600.0000 mg | ORAL_CAPSULE | Freq: Two times a day (BID) | ORAL | 0 refills | Status: DC

## 2020-10-31 MED ORDER — TRAZODONE HCL 150 MG PO TABS
150.0000 mg | ORAL_TABLET | Freq: Every day | ORAL | 0 refills | Status: DC
Start: 2020-10-31 — End: 2021-01-11

## 2020-10-31 MED ORDER — PHENOL 1.4 % MT LIQD: 1.0000 | OROMUCOSAL | 0 refills | Status: DC | PRN

## 2020-10-31 MED ORDER — QUETIAPINE FUMARATE 50 MG PO TABS: 250.0000 mg | ORAL_TABLET | Freq: Every day | ORAL | 0 refills | Status: DC

## 2020-10-31 MED ORDER — DULOXETINE HCL 60 MG PO CPEP: 60.0000 mg | ORAL_CAPSULE | Freq: Every day | ORAL | 0 refills | Status: DC

## 2020-10-31 MED ORDER — GABAPENTIN 300 MG PO CAPS
ORAL_CAPSULE | ORAL | 0 refills | Status: DC
Start: 2020-10-31 — End: 2021-01-11

## 2020-10-31 MED ORDER — HYDROXYZINE HCL 25 MG PO TABS
25.0000 mg | ORAL_TABLET | Freq: Three times a day (TID) | ORAL | 0 refills | Status: DC | PRN
Start: 2020-10-31 — End: 2021-01-11

## 2020-10-31 NOTE — ED Notes (Signed)
Pt eating lunch

## 2020-10-31 NOTE — ED Notes (Signed)
Pt eating dinner

## 2020-10-31 NOTE — ED Notes (Signed)
Pt eating breakfast 

## 2020-10-31 NOTE — ED Provider Notes (Signed)
[facility] Discharge Suicide Risk Assessment   Principal Problem: Schizoaffective disorder, bipolar type [facility]) Discharge Diagnoses: Principal Problem:   Schizoaffective disorder, bipolar type ([place]) Active Problems:   Substance induced mood disorder ([place])  Subjective:  Dylan [name], 56 y.o., male patient seen face to face by this provider, chart reviewed and discussed with Dr. Serafina Mitchell 10/31/20.  Patient informed this Probation officer that he called for his intake interview with [place] and he was informed that the interview was in person.  Patient asked if he could be discharged and transferred ported to [place].   On evaluation Dylan [name] is sitting in his bed in no acute distress.  He is calm/cooperative alert/oriented x 4.  States his depression has improved.  Denies anxiety.  Reports his mood is euthymic with congruent affect.  He does not appear to be responding to internal/external stimuli.  Denies auditory and visual hallucinations.  Denies suicidal/homicidal ideations.  Denies access to firearms/weapons.  Patient contracts for safety at this time.  Denies access to firearms/weapons.  Patient reports eating/sleeping without difficulty, tolerating medications without adverse reactions, and attending/participating in group sessions.  He denies any current withdrawal symptoms.  Denies any health concerns. Reassurance, support, and encouragement provided Total Time spent with patient: 30 minutes  Musculoskeletal: Strength & Muscle Tone: within normal limits Gait & Station: normal Patient leans: N/A  Psychiatric Specialty Exam  Presentation  General Appearance: Appropriate for Environment; Casual  Eye Contact:Good  Speech:Clear and Coherent; Normal Rate  Speech Volume:Normal  Handedness:Right   Mood and Affect  Mood:Depressed  Duration of Depression Symptoms: Greater than two weeks  Affect:Appropriate; Congruent   Thought Process  Thought Processes:Coherent  Descriptions  of Associations:Intact  Orientation:None  Thought Content:Logical  History of Schizophrenia/Schizoaffective disorder:No  Duration of Psychotic Symptoms:Greater than six months  Hallucinations:Hallucinations: None Description of Auditory Hallucinations: hears voices, states they are better, will not state what voices are saying  Ideas of Reference:None  Suicidal Thoughts:Suicidal Thoughts: No SI Passive Intent and/or Plan: Without Intent; Without Plan; Without Means to Carry Out  Homicidal Thoughts:Homicidal Thoughts: No   Sensorium  Memory:Immediate Good; Recent Good; Remote Good  Judgment:Good  Insight:Good   Executive Functions  Concentration:Good  Attention Span:Good  [place] of Knowledge:Good  Language:Good   Psychomotor Activity  Psychomotor Activity:Psychomotor Activity: Normal   Assets  Assets:Communication Skills; Desire for Improvement; Leisure Time; Physical Health; Resilience; Transportation   Sleep  Sleep:Sleep: Good Number of Hours of Sleep: 8   Physical Exam: Physical Examsee discharge summary ROSsee discharge summary Blood pressure 110/73, pulse 70, temperature 97.9 F (36.6 C), temperature source Oral, resp. rate 20, SpO2 98 %. There is no height or weight on file to calculate BMI.  Mental Status Per Nursing Assessment::   On Admission:   per Earleen Newport NP on 10/26/2020 admission h&p Dylan [name], 55 y.o., male patient seen face to face by this provider, consulted with Dr. Ernie Hew and chart reviewed on 10/26/20.  On evaluation Dylan [name] reports hat he is not feeling well and reports that he came to urgent care because he was having thoughts of jumping off a parking deck that was stopped when police showed.  Patient denies any outpatient psychiatric services at this time and reports he has not taken any psychotropic medication for 5 days prior to coming to urgent care. Patient continues to endorse suicidal  ideation with intent and plan.  Patient would not elaborate on questions or answer questions dealing with auditory/visual hallucinations or paranoia.  Patient has not provided a urine sample for urine drug screen or urinalysis. During evaluation Dylan [name] is laying in bed in and out of sleep in no acute distress.  He is oriented x 4 and calm.  His mood is irritable and dysphoric with congruent affect.  He has normal speech, and behavior.  Objectively there is no evidence of psychosis/mania or delusional thinking; but patient states he is having auditory/visual hallucinations but will not elaborate.  He is able to converse coherently, goal directed thoughts, no distractibility, or pre-occupation when awake      Demographic Factors:  Male and Low socioeconomic status  Loss Factors: Financial problems/change in socioeconomic status  Historical Factors: Impulsivity  Risk Reduction Factors:   Religious beliefs about death, Positive social support, Positive therapeutic relationship, and Positive coping skills or problem solving skills  Continued Clinical Symptoms:  Severe Anxiety and/or Agitation Depression:   Comorbid alcohol abuse/dependence Impulsivity Alcohol/Substance Abuse/Dependencies  Cognitive Features That Contribute To Risk:  None    Suicide Risk:  Minimal: No identifiable suicidal ideation.  Patients presenting with no risk factors but with morbid ruminations; may be classified as minimal risk based on the severity of the depressive symptoms   Follow-up [facility] .   Specialty: Urgent Care Why: Please go during walk in hours to establish outpatient psychiatric services.   Medication Management Walk-In Hours: Monday-Friday from 8:00am-11:00am. Please arrive between 7:30am-7:45am to ensure you are seen, as patients are seen on a first come, first served basis.   Therapy Walk-In Hours: Monday-Wednesday from 8:00am-until slots  are full. Please arrive between 7:30am-7:45am to ensure you are seen, as patients are seen on a first come, first served basis.   Friday from 1:00pm-5:00pm Contact information: [place]        Alcohol and Drug Services. Call.   Why: Please contact to establish outpatient substance abuse services. Be sure to have any discharge paperwork from this encounter, inclduing a list of medications. Contact information: [place], [place] [address] Office: [phone]  Fax: [phone]        [facility] Follow up.   Why: Please contact regarding additional outpatient substance use services, such as intensive substance abuse counseling (SA-IOP). Please have any discharge paperwork from this encounter available. Contact information: [address] [place], [place] [address]   (p) [phone]  (f) [phone]                Plan Of Care/Follow-up recommendations:  Activity:  as tolerated  Diet:  regular  Discharge patient.    He was provided with 2-week supply prescription for gabapentin, Seroquel, Cymbalta, trazodone.  Reports he has enough Biktarvy medication and does not need a prescription.   Patient provided taxi voucher and was transported to the [facility] for an intake interview.   Provided resources for same health, alcohol and drug services, [facility] including open access walk-in hours on the second floor.     No evidence of imminent risk to self or others at present.    Patient does not meet criteria for psychiatric inpatient admission. Discussed crisis plan, support from social network, calling 911, coming to the Emergency Department, and calling Suicide Hotline.  Revonda Humphrey, NP 10/31/2020, 6:01 PM

## 2020-10-31 NOTE — ED Provider Notes (Signed)
FBC/OBS ASAP Discharge Summary  Date and Time: 10/31/2020 5:59 PM  Name: Dylan [name]  MRN:  [id number]   Discharge Diagnoses:  Final diagnoses:  Schizoaffective disorder, depressive type ([place])  Generalized anxiety disorder  Insomnia due to other mental disorder    Subjective: Dylan [name], 55 y.o., male patient seen face to face by this provider, chart reviewed and discussed with Dr. Serafina Mitchell 10/31/20.  Patient informed this Probation officer that he called for his intake interview with [facility] and he was informed that the interview was in person.  Patient asked if he could be discharged and transferred ported to [place].  On evaluation Conroy Goracke is sitting in his bed in no acute distress.  He is calm/cooperative alert/oriented x 4.  States his depression has improved.  Denies anxiety.  Reports his mood is euthymic with congruent affect.  He does not appear to be responding to internal/external stimuli.  Denies auditory and visual hallucinations.  Denies suicidal/homicidal ideations.  Denies access to firearms/weapons.  Patient contracts for safety at this time.  Denies access to firearms/weapons.  Patient reports eating/sleeping without difficulty, tolerating medications without adverse reactions, and attending/participating in group sessions.  He denies any current withdrawal symptoms.  Denies any health concerns. Reassurance, support, and encouragement provided.   Patient declined for this writer to contact any collateral.  Stay Summary: Patient was admitted to the [facility] for suicidal ideations, substance abuse and crisis management on 10/26/2020.  Patient's home medications were restarted: Gabapentin 600 mg in the a.m. and 900 mg in p.m., Biktarvy 50-2 100-25 mg tablets 1 tablet daily, Cymbalta 60 mg daily, Vistaril 25 mg 3 times daily as needed.  Seroquel was increased to 250 mg nightly.  Reports he needs enough medication for 2 weeks.  That would give him enough time to get an appointment  with his infectious disease doctor Dr. [name].  He was provided with 2-week supply prescription for gabapentin, Seroquel, Cymbalta, trazodone.  Reports he has enough Biktarvy medication and does not need a prescription.  Patient tolerated medications without adverse reactions while on the unit.  He is improvement in symptom reduction was monitored by staff.  He was offered substance abuse treatment resources for residential and outpatient treatment, and homeless shelters. Patient accepted resources but states he is not interested.  States he is only interested in staying at the [facility] at this time. he was provided with education on schizoaffective disorder.  He was provided a taxi voucher and was transported to [facility] to make his intake interview.  Total Time spent with patient: 30 minutes  Past Psychiatric History: See admission H&P  Past Medical History:  Past Medical History:  Diagnosis Date   Anxiety    Arthritis    Bipolar 1 disorder ([place])    Colon polyps    Depression    GERD (gastroesophageal reflux disease)    Hepatitis B    Human immunodeficiency virus (HIV) ([place])    Hyperlipidemia    Hypertension    Neuromuscular disorder ([place])    neuropathy   Neuropathy    Pre-diabetes    Schizophrenia ([place])     Past Surgical History:  Procedure Laterality Date   FOOT ARTHRODESIS Right 09/17/2019   Procedure: FUSION RIGHT LISFRANC JOINT;  Surgeon: Newt Minion, MD;  Location: [facility];  Service: Orthopedics;  Laterality: Right;   FOOT ARTHRODESIS Right 09/2019   HEMORROIDECTOMY     IR FLUORO GUIDE CV LINE RIGHT  02/22/2017   IR US  GUIDE VASC ACCESS RIGHT  02/22/2017   TUMOR REMOVAL     From Chest   Family History:  Family History  Problem Relation Age of Onset   CAD Mother    Diabetes Father    Colon cancer Maternal Aunt 60   Diabetes Maternal Aunt    Heart disease Maternal Uncle    Esophageal cancer Neg Hx    Rectal cancer Neg Hx    Stomach cancer Neg Hx     Family Psychiatric History: See admission H&P Social History:  Social History   Substance and Sexual Activity  Alcohol Use Yes   Comment: occassional use on weekends     Social History   Substance and Sexual Activity  Drug Use Yes   Types: Cocaine, Marijuana   Comment: last use was Friday 03/05/2020    Social History   Socioeconomic History   Marital status: Married    Spouse name: Not on file   Number of children: 1   Years of education: Not on file   Highest education level: Not on file  Occupational History   Not on file  Tobacco Use   Smoking status: Every Day    Packs/day: 0.50    Years: 15.00    Pack years: 7.50    Types: E-cigarettes, Cigarettes   Smokeless tobacco: Never   Tobacco comments:    has patches when he is ready to quit  Vaping Use   Vaping Use: Some days   Substances: Nicotine  Substance and Sexual Activity   Alcohol use: Yes    Comment: occassional use on weekends   Drug use: Yes    Types: Cocaine, Marijuana    Comment: last use was Friday 03/05/2020   Sexual activity: Yes    Partners: Female    Birth control/protection: Condom    Comment: condoms given  Other Topics Concern   Not on file  Social History Narrative   Not on file   Social Determinants of Health   Financial Resource Strain: Not on file  Food Insecurity: Not on file  Transportation Needs: Not on file  Physical Activity: Not on file  Stress: Not on file  Social Connections: Not on file   SDOH:  SDOH Screenings   Alcohol Screen: Low Risk    Last Alcohol Screening Score (AUDIT): 3  Depression (PHQ2-9): Medium Risk   PHQ-2 Score: 15  Financial Resource Strain: Not on file  Food Insecurity: Not on file  Housing: Not on file  Physical Activity: Not on file  Social Connections: Not on file  Stress: Not on file  Tobacco Use: High Risk   Smoking Tobacco Use: Every Day   Smokeless Tobacco Use: Never   Passive Exposure: Not on file  Transportation Needs: Not on file     Tobacco Cessation:  A prescription for an FDA-approved tobacco cessation medication was offered at discharge and the patient refused  Current Medications:  Current Facility-Administered Medications  Medication Dose Route Frequency Provider Last Rate Last Admin   acetaminophen (TYLENOL) tablet 650 mg  650 mg Oral Q6H PRN Rozetta Nunnery, NP       alum & mag hydroxide-simeth (MAALOX/MYLANTA) 200-200-20 MG/5ML suspension 30 mL  30 mL Oral Q4H PRN Lindon Romp A, NP       bictegravir-emtricitabine-tenofovir AF (BIKTARVY) 50-200-25 MG per tablet 1 tablet  1 tablet Oral Daily Lindon Romp A, NP   1 tablet at 10/31/20 0941   DULoxetine (CYMBALTA) DR capsule 60 mg  60 mg Oral  Daily Lindon Romp A, NP   60 mg at 10/31/20 0941   gabapentin (NEURONTIN) capsule 600 mg  600 mg Oral BID Lindon Romp A, NP   600 mg at 10/31/20 0941   guaiFENesin (MUCINEX) 12 hr tablet 600 mg  600 mg Oral BID PRN Ival Bible, MD   600 mg at 10/29/20 1405   hydrOXYzine (ATARAX/VISTARIL) tablet 25 mg  25 mg Oral TID PRN Rozetta Nunnery, NP       magnesium hydroxide (MILK OF MAGNESIA) suspension 30 mL  30 mL Oral Daily PRN Lindon Romp A, NP       meloxicam (MOBIC) tablet 7.5 mg  7.5 mg Oral Daily Lindon Romp A, NP   7.5 mg at 10/31/20 0941   menthol-cetylpyridinium (CEPACOL) lozenge 3 mg  1 lozenge Oral PRN Ival Bible, MD   3 mg at 10/30/20 1023   nicotine (NICODERM CQ - dosed in mg/24 hours) patch 14 mg  14 mg Transdermal Daily Ival Bible, MD       phenol (CHLORASEPTIC) mouth spray 1 spray  1 spray Mouth/Throat PRN Ival Bible, MD   1 spray at 10/30/20 1014   QUEtiapine (SEROQUEL) tablet 250 mg  250 mg Oral QHS Ival Bible, MD   250 mg at 10/30/20 2143   rosuvastatin (CRESTOR) tablet 20 mg  20 mg Oral Daily Lindon Romp A, NP   20 mg at 10/31/20 0941   traZODone (DESYREL) tablet 150 mg  150 mg Oral QHS Lindon Romp A, NP   150 mg at 10/30/20 2142   Current Outpatient  Medications  Medication Sig Dispense Refill   bictegravir-emtricitabine-tenofovir AF (BIKTARVY) 50-200-25 MG TABS tablet Take 1 tablet by mouth daily. 30 tablet 6   meloxicam (MOBIC) 7.5 MG tablet Take 1 tablet (7.5 mg total) by mouth daily. 30 tablet 0   rosuvastatin (CRESTOR) 20 MG tablet Take 1 tablet (20 mg total) by mouth daily. 30 tablet 6   DULoxetine (CYMBALTA) 60 MG capsule Take 1 capsule (60 mg total) by mouth daily. For neuropathy 14 capsule 0   gabapentin (NEURONTIN) 300 MG capsule Take orally 2 caps (600mg ) in the morning and 3 caps (900mg ) in the evening 70 capsule 0   gabapentin (NEURONTIN) 300 MG capsule Take 2 capsules (600 mg total) by mouth 2 (two) times daily. 14 capsule 0   guaiFENesin (MUCINEX) 600 MG 12 hr tablet Take 1 tablet (600 mg total) by mouth 2 (two) times daily as needed for cough or to loosen phlegm (congestion). 14 tablet 0   hydrOXYzine (ATARAX/VISTARIL) 25 MG tablet Take 1 tablet (25 mg total) by mouth 3 (three) times daily as needed for anxiety. 42 tablet 0   menthol-cetylpyridinium (CEPACOL) 3 MG lozenge Take 1 lozenge (3 mg total) by mouth as needed for sore throat (sore throat). 100 tablet 12   phenol (CHLORASEPTIC) 1.4 % LIQD Use as directed 1 spray in the mouth or throat as needed for throat irritation / pain.  0   QUEtiapine (SEROQUEL) 200 MG tablet Take 1 tablet (200 mg total) by mouth at bedtime. 30 tablet 1   QUEtiapine (SEROQUEL) 50 MG tablet Take 5 tablets (250 mg total) by mouth at bedtime. 70 tablet 0   traZODone (DESYREL) 150 MG tablet Take 1 tablet (150 mg total) by mouth at bedtime. 30 tablet 0    PTA Medications: (Not in a hospital admission)   Musculoskeletal  Strength & Muscle Tone: within normal limits Gait & Station:  normal Patient leans: N/A  Psychiatric Specialty Exam  Presentation  General Appearance: Appropriate for Environment; Casual  Eye Contact:Good  Speech:Clear and Coherent; Normal Rate  Speech  Volume:Normal  Handedness:Right   Mood and Affect  Mood:Depressed  Affect:Appropriate; Congruent   Thought Process  Thought Processes:Coherent  Descriptions of Associations:Intact  Orientation:None  Thought Content:Logical  Diagnosis of Schizophrenia or Schizoaffective disorder in past: No  Duration of Psychotic Symptoms: Greater than six months   Hallucinations:Hallucinations: None Description of Auditory Hallucinations: hears voices, states they are better, will not state what voices are saying  Ideas of Reference:None  Suicidal Thoughts:Suicidal Thoughts: No SI Passive Intent and/or Plan: Without Intent; Without Plan; Without Means to Carry Out  Homicidal Thoughts:Homicidal Thoughts: No   Sensorium  Memory:Immediate Good; Recent Good; Remote Good  Judgment:Good  Insight:Good   Executive Functions  Concentration:Good  Attention Span:Good  [place] of Knowledge:Good  Language:Good   Psychomotor Activity  Psychomotor Activity:Psychomotor Activity: Normal   Assets  Assets:Communication Skills; Desire for Improvement; Leisure Time; Physical Health; Resilience; Transportation   Sleep  Sleep:Sleep: Good Number of Hours of Sleep: 8   No data recorded  Physical Exam  Physical Exam Vitals and nursing note reviewed.  Constitutional:      Appearance: Normal appearance. He is well-developed.  HENT:     Head: Normocephalic.  Eyes:     Conjunctiva/sclera: Conjunctivae normal.  Cardiovascular:     Rate and Rhythm: Normal rate and regular rhythm.  Pulmonary:     Effort: Pulmonary effort is normal. No respiratory distress.  Musculoskeletal:     Cervical back: Normal range of motion.  Skin:    Coloration: Skin is not jaundiced or pale.  Neurological:     Mental Status: He is alert and oriented to person, place, and time.  Psychiatric:        Attention and Perception: Attention and perception normal.        Mood and Affect: Mood and  affect normal.        Speech: Speech normal.        Behavior: Behavior normal.        Thought Content: Thought content normal.        Cognition and Memory: Cognition normal.        Judgment: Judgment is impulsive.   Review of Systems  Constitutional: Negative.   HENT: Negative.    Eyes: Negative.   Respiratory: Negative.    Cardiovascular: Negative.   Musculoskeletal: Negative.   Skin: Negative.   Neurological: Negative.   Blood pressure 110/73, pulse 70, temperature 97.9 F (36.6 C), temperature source Oral, resp. rate 20, SpO2 98 %. There is no height or weight on file to calculate BMI.  Demographic Factors:  Male and Low socioeconomic status  Loss Factors: Financial problems/change in socioeconomic status  Historical Factors: Impulsivity  Risk Reduction Factors:   Sense of responsibility to family, Positive social support, Positive therapeutic relationship, and Positive coping skills or problem solving skills  Continued Clinical Symptoms:  Severe Anxiety and/or Agitation Depression:   Comorbid alcohol abuse/dependence Impulsivity Alcohol/Substance Abuse/Dependencies  Cognitive Features That Contribute To Risk:  None    Suicide Risk:  Minimal: No identifiable suicidal ideation.  Patients presenting with no risk factors but with morbid ruminations; may be classified as minimal risk based on the severity of the depressive symptoms  Plan Of Care/Follow-up recommendations:  Activity:  as tolerated  Diet:  regular  Disposition:   Discharge patient.   He was provided with  2-week supply prescription for gabapentin, Seroquel, Cymbalta, trazodone.  Reports he has enough Biktarvy medication and does not need a prescription.  Patient provided taxi voucher and was transported to the [facility] for an intake interview.  Provided resources for same health, alcohol and drug services, [facility] including open access walk-in hours on the second  floor.   No evidence of imminent risk to self or others at present.    Patient does not meet criteria for psychiatric inpatient admission. Discussed crisis plan, support from social network, calling 911, coming to the Emergency Department, and calling Suicide Hotline.   Revonda Humphrey, NP 10/31/2020, 5:59 PM

## 2020-10-31 NOTE — Progress Notes (Signed)
Patient sitting in dayroom watching television with a peer.  Cooperative, calm.  Will continue to monitor for safety.

## 2020-10-31 NOTE — Progress Notes (Signed)
AVS and Rxs reviewed with patient.  Patient ambulated independently to lobby without issue.  All belongings returned and belonging sheet signed.  Patient stated he feels safe and ready for discharge.  Discharged via cab to [facility].

## 2020-10-31 NOTE — ED Notes (Signed)
Pt asleep in bed. Respirations even and unlabored with no signs of acute distress. Will continue to monitor for safety.

## 2020-10-31 NOTE — ED Notes (Signed)
Patient out on unit.  Ate breakfast, but slept through group.  Patient pleasant, cooperative.  Medication compliant.  Denied SI, HI, AVH.  Rated anxiety and depression 8/10.  Continue to monitor for safety.

## 2020-10-31 NOTE — Progress Notes (Deleted)
Dylan [name] received his AVS, questions answered and he was discharged without incident.

## 2020-12-21 ENCOUNTER — Other Ambulatory Visit: Payer: Self-pay

## 2020-12-21 ENCOUNTER — Ambulatory Visit (INDEPENDENT_AMBULATORY_CARE_PROVIDER_SITE_OTHER): Payer: Medicaid Other

## 2020-12-21 ENCOUNTER — Encounter: Payer: Self-pay | Admitting: Orthopedic Surgery

## 2020-12-21 ENCOUNTER — Telehealth: Payer: Self-pay

## 2020-12-21 ENCOUNTER — Ambulatory Visit (INDEPENDENT_AMBULATORY_CARE_PROVIDER_SITE_OTHER): Payer: Medicaid Other | Admitting: Orthopedic Surgery

## 2020-12-21 ENCOUNTER — Ambulatory Visit: Payer: Medicaid Other | Admitting: Internal Medicine

## 2020-12-21 DIAGNOSIS — M1712 Unilateral primary osteoarthritis, left knee: Secondary | ICD-10-CM

## 2020-12-21 DIAGNOSIS — M25562 Pain in left knee: Secondary | ICD-10-CM

## 2020-12-21 DIAGNOSIS — M79671 Pain in right foot: Secondary | ICD-10-CM

## 2020-12-21 NOTE — Progress Notes (Signed)
Office Visit Note   Patient: Dylan [name]           Date of Birth: 1965/07/07           MRN: [id number] Visit Date: 12/21/2020              Requested by: Charlott Rakes, MD [place],  [place] [id number] PCP: Charlott Rakes, MD  Chief Complaint  Patient presents with   Right Foot - Follow-up    Right Lisfranc fusion 2021   Left Knee - Pain      HPI: Patient is a 55 year old gentleman who presents for 2 issues.  #1 he is over a year out from right Lisfranc fusion.  Patient states he has been wearing custom orthotics but still has pain across the midfoot with activities of daily living.  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  He has tried steroid injections tried anti-inflammatories without relief.  Assessment & Plan: Visit Diagnoses:  1. Pain in right foot   2. Acute pain of left knee   3. Unilateral primary osteoarthritis, left knee     Plan: Recommended an over-the-counter sole orthotic for the right foot as well as a carbon fiber plate discussed that if this does not work we could proceed with revision fusion.  Due to the tricompartmental arthritis of the left knee and failure of conservative care patient would like to proceed with a total knee arthroplasty.  Risks and benefits were discussed including the effort required for rehab.  Patient states he understands and wished to proceed with a left total knee arthroplasty.  Follow-Up Instructions: Return if symptoms worsen or fail to improve.   Ortho Exam  Patient is alert, oriented, no adenopathy, well-dressed, normal affect, normal respiratory effort. Examination patient has varus alignment of the left knee worse than the right with an antalgic gait he has crepitation with range of  motion of the left knee collaterals and cruciates are stable he is tender to palpation the patellofemoral joint as well as medial lateral joint line.  Examination the right foot there is swelling around the midfoot but no signs of infection.  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  There is lucency around the screws.  No hardware failure.  No images are attached to the encounter.  Labs: Lab Results  Component Value Date   HGBA1C 6.0 (H) 10/26/2020   HGBA1C 6.2 (H) 04/28/2020   HGBA1C 6.0 (H) 03/08/2020   ESRSEDRATE 2 07/14/2020   REPTSTATUS 03/03/2017 FINAL 02/26/2017   CULT  02/26/2017    NO GROWTH 5 DAYS Performed at [facility], [place] [address] [place], [place] [address]      Lab Results  Component Value Date   ALBUMIN 4.1 10/26/2020   ALBUMIN 4.8 08/24/2020   ALBUMIN 4.7 04/27/2020    Lab Results  Component Value Date   MG 2.8 (H) 02/20/2017   No results found for: VD25OH  No results found for: PREALBUMIN CBC EXTENDED Latest Ref Rng & Units 10/26/2020 08/24/2020 07/06/2020  WBC 4.0 - 10.5 K/uL 7.7 11.5(H) 4.5  RBC 4.22 - 5.81 MIL/uL 4.58 4.93 4.50  HGB 13.0 - 17.0 g/dL 14.1 15.0 13.7  HCT 39.0 - 52.0 % 41.4 46.0 41.0  PLT 150 - 400 K/uL 218 217 163  NEUTROABS 1.7 - 7.7 K/uL 4.4 8.3(H) 2,183  LYMPHSABS 0.7 - 4.0 K/uL 2.8 2.3 1,778     There is no height or weight on file to calculate BMI.  Orders:  Orders Placed This Encounter  Procedures   XR Foot Complete Right   XR Knee 1-2 Views Left   No orders of the defined types were placed in this encounter.    Procedures: No procedures performed  Clinical Data: No additional findings.  ROS:  All other systems negative, except as noted in the HPI. Review of Systems  Objective: Vital Signs: There were no vitals taken for this visit.  Specialty Comments:  No specialty  comments available.  PMFS History: Patient Active Problem List   Diagnosis Date Noted   Substance induced mood disorder ([place]) 10/26/2020   Bipolar affective disorder, mixed, severe, with psychotic behavior ([place]) 04/28/2020   Fatty liver 03/09/2020   Schizophrenia ([place]) 03/08/2020   Generalized anxiety disorder 02/03/2020   Insomnia due to other mental disorder 02/03/2020   Lisfranc dislocation, right, initial encounter    Prediabetes 07/22/2019   Tobacco abuse 07/10/2018   ESRD needing dialysis ([place]) 03/03/2018   Schizoaffective disorder, bipolar type ([place])    Acute renal failure (ARF) (HCC)    Suicidal ideations    Chronic 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 coma (HCC)    AKI (acute kidney injury) ([place]) 02/19/2017   Rhabdomyolysis 02/19/2017   Suicidal overdose ([place]) 02/19/2017   Acute hepatitis 02/19/2017   Elevated LFTs    Schizoaffective disorder, depressive type ([place]) 09/05/2011   Degenerative disc disease 08/21/2011   HIV disease ([place]) 08/17/2011   Past Medical History:  Diagnosis Date   Anxiety    Arthritis    Bipolar 1 disorder (HCC)    Colon polyps    Depression    GERD (gastroesophageal reflux disease)    Hepatitis B    Human immunodeficiency virus (HIV) ([place])    Hyperlipidemia    Hypertension    Neuromuscular disorder ([place])    neuropathy   Neuropathy    Pre-diabetes    Schizophrenia ([place])     Family History  Problem Relation Age of Onset   CAD Mother    Diabetes Father    Colon cancer Maternal Aunt 60   Diabetes Maternal Aunt    Heart disease Maternal Uncle    Esophageal cancer Neg Hx    Rectal cancer Neg Hx    Stomach cancer Neg Hx     Past Surgical History:  Procedure Laterality Date   FOOT ARTHRODESIS Right 09/17/2019   Procedure: FUSION RIGHT LISFRANC JOINT;  Surgeon: Newt Minion, MD;  Location: [facility];  Service: Orthopedics;  Laterality: Right;   FOOT ARTHRODESIS Right 09/2019   HEMORROIDECTOMY     IR FLUORO GUIDE CV LINE  RIGHT  02/22/2017   IR US GUIDE VASC ACCESS RIGHT  02/22/2017   TUMOR REMOVAL     From Chest   Social History   Occupational History   Not on file  Tobacco Use   Smoking status: Every Day    Packs/day: 0.50    Years: 15.00    Pack years: 7.50    Types: E-cigarettes, Cigarettes   Smokeless tobacco: Never   Tobacco comments:    has patches when he is ready to quit  Vaping Use  Vaping Use: Some days   Substances: Nicotine  Substance and Sexual Activity   Alcohol use: Yes    Comment: occassional use on weekends   Drug use: Yes    Types: Cocaine, Marijuana    Comment: last use was Friday 03/05/2020   Sexual activity: Yes    Partners: Female    Birth control/protection: Condom    Comment: condoms given

## 2020-12-21 NOTE — Telephone Encounter (Signed)
Called patient to see if he will be able to make it to today's appointment, call could not be completed.   Beryle Flock, RN

## 2020-12-28 ENCOUNTER — Encounter: Payer: Self-pay | Admitting: Internal Medicine

## 2021-01-02 ENCOUNTER — Emergency Department (HOSPITAL_COMMUNITY)
Admission: EM | Admit: 2021-01-02 | Discharge: 2021-01-03 | Disposition: A | Discharge status: Home / Self Care | Payer: Medicaid Other | Attending: Emergency Medicine | Admitting: Emergency Medicine

## 2021-01-02 ENCOUNTER — Other Ambulatory Visit: Payer: Self-pay

## 2021-01-02 DIAGNOSIS — F17210 Nicotine dependence, cigarettes, uncomplicated: Secondary | ICD-10-CM | POA: Diagnosis not present

## 2021-01-02 DIAGNOSIS — I120 Hypertensive chronic kidney disease with stage 5 chronic kidney disease or end stage renal disease: Secondary | ICD-10-CM | POA: Diagnosis not present

## 2021-01-02 DIAGNOSIS — R112 Nausea with vomiting, unspecified: Secondary | ICD-10-CM | POA: Insufficient documentation

## 2021-01-02 DIAGNOSIS — Z992 Dependence on renal dialysis: Secondary | ICD-10-CM | POA: Diagnosis not present

## 2021-01-02 DIAGNOSIS — N186 End stage renal disease: Secondary | ICD-10-CM | POA: Diagnosis not present

## 2021-01-02 DIAGNOSIS — R1011 Right upper quadrant pain: Secondary | ICD-10-CM | POA: Diagnosis not present

## 2021-01-02 DIAGNOSIS — Z79899 Other long term (current) drug therapy: Secondary | ICD-10-CM | POA: Insufficient documentation

## 2021-01-02 DIAGNOSIS — Z21 Asymptomatic human immunodeficiency virus [HIV] infection status: Secondary | ICD-10-CM | POA: Diagnosis not present

## 2021-01-02 DIAGNOSIS — Z91040 Latex allergy status: Secondary | ICD-10-CM | POA: Insufficient documentation

## 2021-01-02 NOTE — ED Triage Notes (Signed)
Pt c/o abd pain, nausea, vomiting, and lightheaded for the past couple of days.

## 2021-01-03 ENCOUNTER — Emergency Department (HOSPITAL_COMMUNITY): Payer: Medicaid Other

## 2021-01-03 LAB — COMPREHENSIVE METABOLIC PANEL
ALT: 41 U/L (ref 0–44)
AST: 27 U/L (ref 15–41)
Albumin: 4.1 g/dL (ref 3.5–5.0)
Alkaline Phosphatase: 110 U/L (ref 38–126)
Anion gap: 6 (ref 5–15)
BUN: 12 mg/dL (ref 6–20)
CO2: 30 mmol/L (ref 22–32)
Calcium: 10 mg/dL (ref 8.9–10.3)
Chloride: 101 mmol/L (ref 98–111)
Creatinine, Ser: 1.12 mg/dL (ref 0.61–1.24)
GFR, Estimated: >60 mL/min (ref >60)
Glucose, Bld: 123 mg/dL — ABNORMAL HIGH (ref 70–99)
Potassium: 4.2 mmol/L (ref 3.5–5.1)
Sodium: 137 mmol/L (ref 135–145)
Total Bilirubin: 0.5 mg/dL (ref 0.3–1.2)
Total Protein: 7.6 g/dL (ref 6.5–8.1)

## 2021-01-03 LAB — URINALYSIS, ROUTINE W REFLEX MICROSCOPIC
Bilirubin Urine: NEGATIVE
Glucose, UA: NEGATIVE mg/dL
Hgb urine dipstick: NEGATIVE
Ketones, ur: NEGATIVE mg/dL
Leukocytes,Ua: NEGATIVE
Nitrite: NEGATIVE
Protein, ur: NEGATIVE mg/dL
Specific Gravity, Urine: 1.015 (ref 1.005–1.030)
pH: 8 (ref 5.0–8.0)

## 2021-01-03 LAB — CBC
HCT: 46 % (ref 39.0–52.0)
Hemoglobin: 15.4 g/dL (ref 13.0–17.0)
MCH: 30.6 pg (ref 26.0–34.0)
MCHC: 33.5 g/dL (ref 30.0–36.0)
MCV: 91.3 fL (ref 80.0–100.0)
Platelets: 231 10*3/uL (ref 150–400)
RBC: 5.04 MIL/uL (ref 4.22–5.81)
RDW: 12.8 % (ref 11.5–15.5)
WBC: 10.4 10*3/uL (ref 4.0–10.5)
nRBC: 0 % (ref 0.0–0.2)

## 2021-01-03 LAB — LIPASE, BLOOD: Lipase: 44 U/L (ref 11–51)

## 2021-01-03 MED ORDER — ALUM & MAG HYDROXIDE-SIMETH 200-200-20 MG/5ML PO SUSP
30.0000 mL | Freq: Once | ORAL | Status: AC
  Administered 2021-01-03: 09:00:00 30 mL via ORAL
  Filled 2021-01-03: qty 30

## 2021-01-03 NOTE — Discharge Instructions (Addendum)
Your ultrasound shows gallstones but without acute infection.  Please return for worsening pain fever inability to eat or drink.  Typically her gallbladder contracts if you smell or eat fatty foods.  Please try and abstain from high fat diet for at least the next few days.  The other possibility is that you have reflux disease.  Try pepcid or tagamet up to twice a day.  Try to avoid things that may make this worse, most commonly these are spicy foods tomato based products fatty foods chocolate and peppermint.  Alcohol and tobacco can also make this worse.   Your ultrasound showed a abnormal finding at the head of the pancreas.  This is something that you need to notify her family doctor about and they can choose to image it with a CAT scan or MRI if they feel is warranted.

## 2021-01-03 NOTE — ED Provider Notes (Signed)
[facility] EMERGENCY DEPARTMENT Provider Note   CSN: [id number] Arrival date & time: 01/02/21  2141     History Chief Complaint  Patient presents with   Nausea   Abdominal Pain    Dylan [name] is a 55 y.o. male.  55 yo M with a chief complaints of right upper quadrant abdominal discomfort.  Going on for a couple days now.  Seems like every time he is something to eat or drink he gets some discomfort to that area and gets nauseated.  Has had some emesis but not very frequently.  Denies fevers.  Has had some diaphoresis every once while with this.  Has never had this happen before.  The history is provided by the patient.  Abdominal Pain Pain location:  RUQ Pain quality: aching   Pain radiates to:  Does not radiate Pain severity:  Moderate Onset quality:  Gradual Duration:  2 days Timing:  Constant Progression:  Worsening Chronicity:  New Relieved by:  Nothing Worsened by:  Nothing Ineffective treatments:  None tried Associated symptoms: nausea and vomiting   Associated symptoms: no chest pain, no chills, no diarrhea, no fever and no shortness of breath       Past Medical History:  Diagnosis Date   Anxiety    Arthritis    Bipolar 1 disorder (HCC)    Colon polyps    Depression    GERD (gastroesophageal reflux disease)    Hepatitis B    Human immunodeficiency virus (HIV) ([place])    Hyperlipidemia    Hypertension    Neuromuscular disorder ([place])    neuropathy   Neuropathy    Pre-diabetes    Schizophrenia ([place])     Patient Active Problem List   Diagnosis Date Noted   Substance induced mood disorder ([place]) 10/26/2020   Bipolar affective disorder, mixed, severe, with psychotic behavior ([place]) 04/28/2020   Fatty liver 03/09/2020   Schizophrenia ([place]) 03/08/2020   Generalized anxiety disorder 02/03/2020   Insomnia due to other mental disorder 02/03/2020   Lisfranc dislocation, right, initial encounter    Prediabetes 07/22/2019   Tobacco abuse  07/10/2018   ESRD needing dialysis ([place]) 03/03/2018   Schizoaffective disorder, bipolar type ([place])    Acute renal failure (ARF) ([place])    Suicidal ideations    Chronic 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 coma ([place])    AKI (acute kidney injury) ([place]) 02/19/2017   Rhabdomyolysis 02/19/2017   Suicidal overdose ([place]) 02/19/2017   Acute hepatitis 02/19/2017   Elevated LFTs    Schizoaffective disorder, depressive type ([place]) 09/05/2011   Degenerative disc disease 08/21/2011   HIV disease ([place]) 08/17/2011    Past Surgical History:  Procedure Laterality Date   FOOT ARTHRODESIS Right 09/17/2019   Procedure: FUSION RIGHT LISFRANC JOINT;  Surgeon: Newt Minion, MD;  Location: [place];  Service: Orthopedics;  Laterality: Right;   FOOT ARTHRODESIS Right 09/2019   HEMORROIDECTOMY     IR FLUORO GUIDE CV LINE RIGHT  02/22/2017   IR US GUIDE VASC ACCESS RIGHT  02/22/2017   TUMOR REMOVAL     From Chest       Family History  Problem Relation Age of Onset   CAD Mother    Diabetes Father    Colon cancer Maternal Aunt 60   Diabetes Maternal Aunt    Heart disease Maternal Uncle    Esophageal cancer Neg Hx    Rectal cancer Neg Hx    Stomach cancer Neg Hx  Social History   Tobacco Use   Smoking status: Every Day    Packs/day: 0.50    Years: 15.00    Pack years: 7.50    Types: E-cigarettes, Cigarettes   Smokeless tobacco: Never   Tobacco comments:    has patches when he is ready to quit  Vaping Use   Vaping Use: Some days   Substances: Nicotine  Substance Use Topics   Alcohol use: Yes    Comment: occassional use on weekends   Drug use: Yes    Types: Cocaine, Marijuana    Comment: last use was Friday 03/05/2020    Home Medications Prior to Admission medications   Medication Sig Start Date End Date Taking? Authorizing Provider  bictegravir-emtricitabine-tenofovir AF (BIKTARVY) 50-200-25 MG TABS tablet Take 1 tablet by mouth daily. 04/05/20   Carlyle Basques, MD   DULoxetine (CYMBALTA) 60 MG capsule Take 1 capsule (60 mg total) by mouth daily. For neuropathy 10/31/20   Revonda Humphrey, NP  gabapentin (NEURONTIN) 300 MG capsule Take orally 2 caps (600mg ) in the morning and 3 caps (900mg ) in the evening 10/31/20   Revonda Humphrey, NP  gabapentin (NEURONTIN) 300 MG capsule Take 2 capsules (600 mg total) by mouth 2 (two) times daily. 10/31/20   Revonda Humphrey, NP  guaiFENesin (MUCINEX) 600 MG 12 hr tablet Take 1 tablet (600 mg total) by mouth 2 (two) times daily as needed for cough or to loosen phlegm (congestion). 10/31/20   Revonda Humphrey, NP  hydrOXYzine (ATARAX/VISTARIL) 25 MG tablet Take 1 tablet (25 mg total) by mouth 3 (three) times daily as needed for anxiety. 10/31/20   Revonda Humphrey, NP  meloxicam (MOBIC) 7.5 MG tablet Take 1 tablet (7.5 mg total) by mouth daily. 07/28/20   Persons, Bevely Palmer, PA  menthol-cetylpyridinium (CEPACOL) 3 MG lozenge Take 1 lozenge (3 mg total) by mouth as needed for sore throat (sore throat). 10/31/20   Revonda Humphrey, NP  phenol (CHLORASEPTIC) 1.4 % LIQD Use as directed 1 spray in the mouth or throat as needed for throat irritation / pain. 10/31/20   Revonda Humphrey, NP  QUEtiapine (SEROQUEL) 200 MG tablet Take 1 tablet (200 mg total) by mouth at bedtime. 10/26/20   Rankin, Shuvon B, NP  QUEtiapine (SEROQUEL) 50 MG tablet Take 5 tablets (250 mg total) by mouth at bedtime. 10/31/20   Revonda Humphrey, NP  rosuvastatin (CRESTOR) 20 MG tablet Take 1 tablet (20 mg total) by mouth daily. 04/05/20   Carlyle Basques, MD  traZODone (DESYREL) 150 MG tablet Take 1 tablet (150 mg total) by mouth at bedtime. 10/31/20   Revonda Humphrey, NP    Allergies    Penicillins, Latex, and Tape  Review of Systems   Review of Systems  Constitutional:  Positive for diaphoresis. Negative for chills and fever.  HENT:  Negative for congestion and facial swelling.   Eyes:  Negative for discharge and visual disturbance.   Respiratory:  Negative for shortness of breath.   Cardiovascular:  Negative for chest pain and palpitations.  Gastrointestinal:  Positive for abdominal pain, nausea and vomiting. Negative for diarrhea.  Musculoskeletal:  Negative for arthralgias and myalgias.  Skin:  Negative for color change and rash.  Neurological:  Negative for tremors, syncope and headaches.  Psychiatric/Behavioral:  Negative for confusion and dysphoric mood.    Physical Exam Updated Vital Signs BP 101/73 (BP Location: Right Arm)    Pulse 64    Temp 98.4 F (36.9  C) (Oral)    Resp 15    Ht 6\' 2"  (1.88 m)    Wt 104.3 kg    SpO2 99%    BMI 29.53 kg/m   Physical Exam Vitals and nursing note reviewed.  Constitutional:      Appearance: He is well-developed.  HENT:     Head: Normocephalic and atraumatic.  Eyes:     Pupils: Pupils are equal, round, and reactive to light.  Neck:     Vascular: No JVD.  Cardiovascular:     Rate and Rhythm: Normal rate and regular rhythm.     Heart sounds: No murmur heard.   No friction rub. No gallop.  Pulmonary:     Effort: No respiratory distress.     Breath sounds: No wheezing.  Abdominal:     General: There is no distension.     Tenderness: There is abdominal tenderness in the right upper quadrant. There is no guarding or rebound.     Comments: Pain with palpation in the right upper quadrant.  Negative Murphy's.  Musculoskeletal:        General: Normal range of motion.     Cervical back: Normal range of motion and neck supple.  Skin:    Coloration: Skin is not pale.     Findings: No rash.  Neurological:     Mental Status: He is alert and oriented to person, place, and time.  Psychiatric:        Behavior: Behavior normal.    ED Results / Procedures / Treatments   Labs (all labs ordered are listed, but only abnormal results are displayed) Labs Reviewed  COMPREHENSIVE METABOLIC PANEL - Abnormal; Notable for the following components:      Result Value   Glucose, Bld  123 (*)    All other components within normal limits  URINALYSIS, ROUTINE W REFLEX MICROSCOPIC - Abnormal; Notable for the following components:   APPearance HAZY (*)    All other components within normal limits  LIPASE, BLOOD  CBC    EKG None  Radiology US Abdomen Limited RUQ (LIVER/GB)  Result Date: 01/03/2021 CLINICAL DATA:  Right upper quadrant pain x3 days EXAM: ULTRASOUND ABDOMEN LIMITED RIGHT UPPER QUADRANT COMPARISON:  None. FINDINGS: Gallbladder: Layering gallstones measuring up to 7 mm. No gallbladder wall thickening or pericholecystic fluid. Negative sonographic Murphy's sign. Common bile duct: Diameter: 4 mm Liver: No focal lesion identified. Within normal limits in parenchymal echogenicity. Portal vein is patent on color Doppler imaging with normal direction of blood flow towards the liver. Other: Suspected 2.0 x 1.8 x 2.6 cm solid lesion along the pancreatic head. IMPRESSION: Cholelithiasis, without associated sonographic findings to suggest acute cholecystitis. Suspected 2.6 cm solid lesion along the pancreatic head, poorly evaluated on ultrasound. Consider CT or MRI abdomen with/without contrast (pancreas protocol) for further evaluation. Electronically Signed   By: Julian Hy M.D.   On: 01/03/2021 10:01    Procedures Procedures   Medications Ordered in ED Medications  alum & mag hydroxide-simeth (MAALOX/MYLANTA) 200-200-20 MG/5ML suspension 30 mL (30 mLs Oral Given 01/03/21 1224)    ED Course  I have reviewed the triage vital signs and the nursing notes.  Pertinent labs & imaging results that were available during my care of the patient were reviewed by me and considered in my medical decision making (see chart for details).    MDM Rules/Calculators/A&P  55 yo M with a chief complaints of right upper quadrant abdominal discomfort.  Going on for couple days now.  Has some discomfort to the right upper quadrant on exam.  LFTs and lipase  are unremarkable.  No leukocytosis not febrile here.  We will obtain a right upper quadrant ultrasound.  Ultrasound with cholelithiasis without acute cholecystitis.  Also incidentally noted cyst to the head of the pancreas.  I discussed results with the patient.  We will have him follow-up with his family doctor.  Given general surgery follow-up as well.  10:52 AM:  I have discussed the diagnosis/risks/treatment options with the patient and believe the pt to be eligible for discharge home to follow-up with PCP. We also discussed returning to the ED immediately if new or worsening sx occur. We discussed the sx which are most concerning (e.g., sudden worsening pain, fever, inability to tolerate by mouth) that necessitate immediate return. Medications administered to the patient during their visit and any new prescriptions provided to the patient are listed below.  Medications given during this visit Medications  alum & mag hydroxide-simeth (MAALOX/MYLANTA) 200-200-20 MG/5ML suspension 30 mL (30 mLs Oral Given 01/03/21 0916)     The patient appears reasonably screen and/or stabilized for discharge and I doubt any other medical condition or other [facility] requiring further screening, evaluation, or treatment in the ED at this time prior to discharge.      Final Clinical Impression(s) / ED Diagnoses Final diagnoses:  RUQ abdominal pain    Rx / DC Orders ED Discharge Orders     None        Deno Etienne, DO 01/03/21 1052

## 2021-01-06 ENCOUNTER — Ambulatory Visit (INDEPENDENT_AMBULATORY_CARE_PROVIDER_SITE_OTHER): Payer: Medicaid Other

## 2021-01-06 ENCOUNTER — Other Ambulatory Visit: Payer: Self-pay

## 2021-01-06 ENCOUNTER — Other Ambulatory Visit: Payer: Medicaid Other

## 2021-01-06 DIAGNOSIS — Z23 Encounter for immunization: Secondary | ICD-10-CM | POA: Diagnosis present

## 2021-01-06 DIAGNOSIS — B20 Human immunodeficiency virus [HIV] disease: Secondary | ICD-10-CM

## 2021-01-06 NOTE — Progress Notes (Signed)
° °  Covid-19 Vaccination Clinic  Name:  Montrey Buist    MRN: [id number] DOB: 1965-12-25  01/06/2021  Mr. Fleig was observed post Covid-19 immunization for 15 minutes without incident. He was provided with Vaccine Information Sheet and instruction to access the V-Safe system.   Mr. Broad was instructed to call 911 with any severe reactions post vaccine: Difficulty breathing  Swelling of face and throat  A fast heartbeat  A bad rash all over body  Dizziness and weakness   Immunizations Administered     Name Date Dose VIS Date Route   Pfizer Covid-19 Vaccine Bivalent Booster 01/06/2021  3:47 PM 0.3 mL 09/08/2020 Intramuscular   Manufacturer: [place]   Lot: [id number]   NDC: [place]

## 2021-01-07 ENCOUNTER — Other Ambulatory Visit (HOSPITAL_COMMUNITY)
Admission: EM | Admit: 2021-01-07 | Discharge: 2021-01-11 | Disposition: A | Discharge status: Home / Self Care | Payer: Medicaid Other | Attending: Urology | Admitting: Urology

## 2021-01-07 ENCOUNTER — Ambulatory Visit: Payer: Medicaid Other | Admitting: Family Medicine

## 2021-01-07 DIAGNOSIS — F251 Schizoaffective disorder, depressive type: Secondary | ICD-10-CM | POA: Insufficient documentation

## 2021-01-07 DIAGNOSIS — R45851 Suicidal ideations: Secondary | ICD-10-CM | POA: Insufficient documentation

## 2021-01-07 DIAGNOSIS — E7800 Pure hypercholesterolemia, unspecified: Secondary | ICD-10-CM

## 2021-01-07 DIAGNOSIS — Z20822 Contact with and (suspected) exposure to covid-19: Secondary | ICD-10-CM | POA: Insufficient documentation

## 2021-01-07 DIAGNOSIS — F411 Generalized anxiety disorder: Secondary | ICD-10-CM

## 2021-01-07 DIAGNOSIS — F99 Mental disorder, not otherwise specified: Secondary | ICD-10-CM

## 2021-01-07 DIAGNOSIS — F5105 Insomnia due to other mental disorder: Secondary | ICD-10-CM

## 2021-01-07 LAB — POCT URINE DRUG SCREEN - MANUAL ENTRY (I-SCREEN)
POC Amphetamine UR: NOT DETECTED
POC Buprenorphine (BUP): NOT DETECTED
POC Cocaine UR: POSITIVE — AB
POC Marijuana UR: NOT DETECTED
POC Methadone UR: NOT DETECTED
POC Methamphetamine UR: NOT DETECTED
POC Morphine: NOT DETECTED
POC Oxazepam (BZO): NOT DETECTED
POC Oxycodone UR: NOT DETECTED
POC Secobarbital (BAR): NOT DETECTED

## 2021-01-07 LAB — T-HELPER CELL (CD4) - (RCID CLINIC ONLY)
CD4 % Helper T Cell: 36 % (ref 33–65)
CD4 T Cell Abs: 910 /uL (ref 400–1790)

## 2021-01-07 LAB — POC SARS CORONAVIRUS 2 AG: SARSCOV2ONAVIRUS 2 AG: NEGATIVE

## 2021-01-07 LAB — POC SARS CORONAVIRUS 2 AG -  ED: SARS Coronavirus 2 Ag: NEGATIVE

## 2021-01-07 MED ORDER — BICTEGRAVIR-EMTRICITAB-TENOFOV 50-200-25 MG PO TABS
1.0000 | ORAL_TABLET | Freq: Every day | ORAL | Status: DC
  Administered 2021-01-08 – 2021-01-11 (×4): 1 via ORAL
  Filled 2021-01-07: qty 14
  Filled 2021-01-07 (×4): qty 1

## 2021-01-07 MED ORDER — ALUM & MAG HYDROXIDE-SIMETH 200-200-20 MG/5ML PO SUSP: 30.0000 mL | ORAL | Status: DC | PRN

## 2021-01-07 MED ORDER — GABAPENTIN 300 MG PO CAPS
600.0000 mg | ORAL_CAPSULE | Freq: Every morning | ORAL | Status: DC
  Administered 2021-01-08 – 2021-01-11 (×4): 600 mg via ORAL
  Filled 2021-01-07 (×3): qty 2
  Filled 2021-01-07: qty 70
  Filled 2021-01-07: qty 2

## 2021-01-07 MED ORDER — MAGNESIUM HYDROXIDE 400 MG/5ML PO SUSP: 30.0000 mL | Freq: Every day | ORAL | Status: DC | PRN

## 2021-01-07 MED ORDER — HYDROXYZINE HCL 25 MG PO TABS: 25.0000 mg | ORAL_TABLET | Freq: Three times a day (TID) | ORAL | Status: DC | PRN

## 2021-01-07 MED ORDER — QUETIAPINE FUMARATE 50 MG PO TABS
250.0000 mg | ORAL_TABLET | Freq: Every day | ORAL | Status: DC
  Administered 2021-01-07: 22:00:00 250 mg via ORAL
  Filled 2021-01-07: qty 1

## 2021-01-07 MED ORDER — ACETAMINOPHEN 325 MG PO TABS: 650.0000 mg | ORAL_TABLET | Freq: Four times a day (QID) | ORAL | Status: DC | PRN

## 2021-01-07 MED ORDER — TRAZODONE HCL 50 MG PO TABS
50.0000 mg | ORAL_TABLET | Freq: Every evening | ORAL | Status: DC | PRN
Start: 2021-01-07 — End: 2021-01-07

## 2021-01-07 MED ORDER — ROSUVASTATIN CALCIUM 20 MG PO TABS
20.0000 mg | ORAL_TABLET | Freq: Every day | ORAL | Status: DC
  Administered 2021-01-08 – 2021-01-11 (×4): 20 mg via ORAL
  Filled 2021-01-07 (×4): qty 1
  Filled 2021-01-07: qty 14

## 2021-01-07 MED ORDER — GABAPENTIN 300 MG PO CAPS
900.0000 mg | ORAL_CAPSULE | Freq: Every day | ORAL | Status: DC
  Administered 2021-01-07 – 2021-01-10 (×3): 900 mg via ORAL
  Filled 2021-01-07 (×4): qty 3

## 2021-01-07 MED ORDER — DULOXETINE HCL 60 MG PO CPEP
60.0000 mg | ORAL_CAPSULE | Freq: Every day | ORAL | Status: DC
  Administered 2021-01-08 – 2021-01-09 (×2): 60 mg via ORAL
  Filled 2021-01-07 (×2): qty 1

## 2021-01-07 MED ORDER — TRAZODONE HCL 150 MG PO TABS
150.0000 mg | ORAL_TABLET | Freq: Every day | ORAL | Status: DC
  Administered 2021-01-07 – 2021-01-10 (×3): 150 mg via ORAL
  Filled 2021-01-07: qty 1
  Filled 2021-01-07: qty 14
  Filled 2021-01-07 (×3): qty 1

## 2021-01-07 NOTE — ED Notes (Signed)
Patient has been brought on the unit and familiarized with unit. Pt has been given a roast beef sandwich and drink

## 2021-01-07 NOTE — ED Notes (Signed)
Pt is lying in bed quietly, no distress noted, will continue to monitor patient for safety

## 2021-01-07 NOTE — ED Notes (Signed)
PT on the unit awaiting  2hr PCR covid results then patient will be admitted to [facility]

## 2021-01-07 NOTE — ED Notes (Signed)
Deliah Boston has been called to pick up patients lab work to take over to the [employer] cone lab
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
## 2021-01-07 NOTE — ED Provider Notes (Signed)
Behavioral Health Admission [facility]&P [facility] & OBS)  Date: 01/07/21 Patient Name: Dylan [name] MRN: [id number] Chief Complaint:  Chief Complaint  Patient presents with   Depression   Suicidal      Diagnoses:  Final diagnoses:  Schizoaffective disorder, depressive type ([place])  Suicidal ideation    HPI: Dylan [name] is a 55 year old male with psychiatric history of schizoaffective disorder depressive type, generalized anxiety, suicidal ideation substance abuse, and substance-induced mood disorder.  Patient presented voluntarily to [facility] via Event organiser.  Patient presented with chief complaint of worsening depression and suicidal ideation; patient states "I don't wanna be here no more."  Patient was seen face-to-face and his chart was reviewed by this nurse practitioner.  On assessment, patient is sitting in assessment room in no apparent distress, he is alert and oriented x4; he is calm and cooperative.  Patient states his mood is "very depressed" and his affect is congruent with his stated mood.  His thought process is coherent and relevant.  Patient denies any medical complaints at this time.  Patient reports that he is experiencing worsening depression and having thoughts of harming himself.  Patient is endorsing depressive symptoms of crying spells, poor focus, racing thoughts, hopelessness, poor sleep, poor appetite and worthlessness. patient states he was in his apartment earlier today and became very depressed.  Patient reports that he walked to downtown [place] and wanted to jump off a parking deck but was stopped by the security guard. Patient identify lost a family members including his daughter that passed away approximately 8 months ago as his main trigger. Patient also reports that he has not been taking his psychotropics. He report that he ran out off his medications 1 month ago and has not been able to schedule appointment/return to [facility] for medication refill.   Patient endorses  suicidal ideation with plan to jump off a parking deck.  Patient has history of multiple suicidal attempts via overdose and wanting to jump off parking deck.  Patient endorses auditory hallucinations of "hearing voices that are yelling and screaming, telling me to kill my dog, and bust out windows."  Patient endorses visual hallucination of " seeing someone climbed up windows to my apartment."  Patient endorses paranoia of someone trying to climb into his apartment through the window on the 4th floor of his apartment building.  He reports that he "duct taped" his apartment window to prevent people from breaking in.  Patient reports that he drank 2 cans of beer this morning 01/07/21, used cocaine 3 to 4 days ago and that he uses marijuana 4-5 times per month.  He will not elaborate further on quantity/frequency of substance use.   PHQ 2-9:  [place] ED from 10/25/2020 in [facility] Office Visit from 03/17/2020 in [place] ED from 11/09/2019 in [facility]  Thoughts that you would be better off dead, or of hurting yourself in some way More than half the days Not at all More than half the days  PHQ-9 Total Score 15 9 19        Flowsheet Row ED from 01/07/2021 in [facility] ED from 01/02/2021 in [place] ED from 10/25/2020 in [place] High Risk No Risk Low Risk        Total Time spent with patient: 20 minutes  Musculoskeletal  Strength & Muscle Tone: within normal limits Gait &  Station: normal Patient leans: Right  Psychiatric Specialty Exam  Presentation General Appearance: Appropriate for Environment  Eye Contact:Good  Speech:Clear and Coherent  Speech Volume:Normal  Handedness:Right   Mood and Affect  Mood:Depressed  Affect:Congruent   Thought Process   Thought Processes:Coherent  Descriptions of Associations:Intact  Orientation:Full (Time, Place and Person)  Thought Content:Logical  Diagnosis of Schizophrenia or Schizoaffective disorder in past: Yes  Duration of Psychotic Symptoms: Greater than six months  Hallucinations:Hallucinations: Auditory; Tactile; Visual Description of Auditory Hallucinations: hears yelling and screamin and voices telling him to kill his dog and "bust" out windows in his apartment. Description of Visual Hallucinations: sees a person trying to climb into his apartment through a window on the 4th floor on his building  Ideas of Reference:None  Suicidal Thoughts:Suicidal Thoughts: Yes, Active SI Active Intent and/or Plan: With Plan  Homicidal Thoughts:Homicidal Thoughts: No   Sensorium  Memory:Immediate Good; Recent Good; Remote Fair  Judgment:Fair  Insight:Good   Executive Functions  Concentration:Good  Attention Span:Good  [place] of Knowledge:Good  Language:Good   Psychomotor Activity  Psychomotor Activity:Psychomotor Activity: Normal   Assets  Assets:Communication Skills; Desire for Improvement; Housing; Physical Health   Sleep  Sleep:Sleep: Poor Number of Hours of Sleep: 3 (3-4 hours per night)   Nutritional Assessment (For OBS and FBC admissions only) Has the patient had a weight loss or gain of 10 pounds or more in the last 3 months?: Yes Has the patient had a decrease in food intake/or appetite?: Yes Does the patient have dental problems?: No Does the patient have eating habits or behaviors that may be indicators of an eating disorder including binging or inducing vomiting?: No Has the patient recently lost weight without trying?: 1 Has the patient been eating poorly because of a decreased appetite?: 0 Malnutrition Screening Tool Score: 1    Physical Exam Vitals and nursing note reviewed.  Constitutional:      General: He is not in acute distress.     Appearance: He is well-developed. He is not ill-appearing or toxic-appearing.  HENT:     Head: Normocephalic and atraumatic.  Eyes:     Conjunctiva/sclera: Conjunctivae normal.  Cardiovascular:     Rate and Rhythm: Normal rate.  Pulmonary:     Effort: Pulmonary effort is normal. No respiratory distress.  Abdominal:     General: There is no distension.     Tenderness: There is no abdominal tenderness.  Musculoskeletal:        General: Normal range of motion.     Cervical back: Neck supple.  Skin:    General: Skin is warm.     Capillary Refill: Capillary refill takes less than 2 seconds.  Neurological:     Mental Status: He is alert and oriented to person, place, and time.  Psychiatric:        Attention and Perception: Attention normal. He is attentive. He perceives auditory and visual hallucinations.        Mood and Affect: Mood is depressed. Affect is tearful.        Speech: Speech normal.        Behavior: Behavior normal.        Thought Content: Thought content includes suicidal ideation.        Cognition and Memory: Cognition normal.        Judgment: Judgment normal.   Review of Systems  Constitutional: Negative.   HENT: Negative.    Eyes: Negative.   Respiratory: Negative.    Cardiovascular: Negative.  Gastrointestinal: Negative.   Genitourinary: Negative.   Musculoskeletal: Negative.   Skin: Negative.   Neurological: Negative.   Endo/Heme/Allergies: Negative.   Psychiatric/Behavioral:  Positive for depression, hallucinations, substance abuse and suicidal ideas. The patient is nervous/anxious.    Blood pressure 128/85, pulse 81, temperature 98.2 F (36.8 C), temperature source Oral, resp. rate 18, SpO2 97 %. There is no height or weight on file to calculate BMI.  Past Psychiatric History: Schizoaffective, Depressive type, generalized anxiety disorder, substance abuse, substance-induced mood disorder, and suicidal ideation/attempt  Is the patient at risk to self?   Yes  Has the patient been a risk to self in the past 6 months?  Yes.    Has the patient been a risk to self within the distant past?  Yes Is the patient a risk to others?  No Has the patient been a risk to others in the past 6 months?  Not Has the patient been a risk to others within the distant past? No   Past Medical History:  Past Medical History:  Diagnosis Date   Anxiety    Arthritis    Bipolar 1 disorder ([place])    Colon polyps    Depression    GERD (gastroesophageal reflux disease)    Hepatitis B    Human immunodeficiency virus (HIV) ([place])    Hyperlipidemia    Hypertension    Neuromuscular disorder ([place])    neuropathy   Neuropathy    Pre-diabetes    Schizophrenia ([place])     Past Surgical History:  Procedure Laterality Date   FOOT ARTHRODESIS Right 09/17/2019   Procedure: FUSION RIGHT LISFRANC JOINT;  Surgeon: Newt Minion, MD;  Location: [facility];  Service: Orthopedics;  Laterality: Right;   FOOT ARTHRODESIS Right 09/2019   HEMORROIDECTOMY     IR FLUORO GUIDE CV LINE RIGHT  02/22/2017   IR US GUIDE VASC ACCESS RIGHT  02/22/2017   TUMOR REMOVAL     From Chest    Family History:  Family History  Problem Relation Age of Onset   CAD Mother    Diabetes Father    Colon cancer Maternal Aunt 60   Diabetes Maternal Aunt    Heart disease Maternal Uncle    Esophageal cancer Neg Hx    Rectal cancer Neg Hx    Stomach cancer Neg Hx     Social History:  Social History   Socioeconomic History   Marital status: Married    Spouse name: Not on file   Number of children: 1   Years of education: Not on file   Highest education level: Not on file  Occupational History   Not on file  Tobacco Use   Smoking status: Every Day    Packs/day: 0.50    Years: 15.00    Pack years: 7.50    Types: E-cigarettes, Cigarettes   Smokeless tobacco: Never   Tobacco comments:    has patches when he is ready to quit  Vaping Use   Vaping Use: Some days   Substances: Nicotine  Substance  and Sexual Activity   Alcohol use: Yes    Comment: occassional use on weekends   Drug use: Yes    Types: Cocaine, Marijuana    Comment: last use was Friday 03/05/2020   Sexual activity: Yes    Partners: Female    Birth control/protection: Condom    Comment: condoms given  Other Topics Concern   Not on file  Social History Narrative   Not on file  Social Determinants of Health   Financial Resource Strain: Not on file  Food Insecurity: Not on file  Transportation Needs: Not on file  Physical Activity: Not on file  Stress: Not on file  Social Connections: Not on file  Intimate Partner Violence: Not on file    SDOH:  SDOH Screenings   Alcohol Screen: Low Risk    Last Alcohol Screening Score (AUDIT): 3  Depression (PHQ2-9): Medium Risk   PHQ-2 Score: 15  Financial Resource Strain: Not on file  Food Insecurity: Not on file  Housing: Not on file  Physical Activity: Not on file  Social Connections: Not on file  Stress: Not on file  Tobacco Use: High Risk   Smoking Tobacco Use: Every Day   Smokeless Tobacco Use: Never   Passive Exposure: Not on file  Transportation Needs: Not on file    Last Labs:  Admission on 01/07/2021  Component Date Value Ref Range Status   POC Amphetamine UR 01/07/2021 None Detected  NONE DETECTED (Cut Off Level 1000 ng/mL) Final   POC Secobarbital (BAR) 01/07/2021 None Detected  NONE DETECTED (Cut Off Level 300 ng/mL) Final   POC Buprenorphine (BUP) 01/07/2021 None Detected  NONE DETECTED (Cut Off Level 10 ng/mL) Final   POC Oxazepam (BZO) 01/07/2021 None Detected  NONE DETECTED (Cut Off Level 300 ng/mL) Final   POC Cocaine UR 01/07/2021 Positive (A)  NONE DETECTED (Cut Off Level 300 ng/mL) Final   POC Methamphetamine UR 01/07/2021 None Detected  NONE DETECTED (Cut Off Level 1000 ng/mL) Final   POC Morphine 01/07/2021 None Detected  NONE DETECTED (Cut Off Level 300 ng/mL) Final   POC Oxycodone UR 01/07/2021 None Detected  NONE DETECTED (Cut Off  Level 100 ng/mL) Final   POC Methadone UR 01/07/2021 None Detected  NONE DETECTED (Cut Off Level 300 ng/mL) Final   POC Marijuana UR 01/07/2021 None Detected  NONE DETECTED (Cut Off Level 50 ng/mL) Final   SARSCOV2ONAVIRUS 2 AG 01/07/2021 NEGATIVE  NEGATIVE Final   Comment: (NOTE) SARS-CoV-2 antigen NOT DETECTED.   Negative results are presumptive.  Negative results do not preclude SARS-CoV-2 infection and should not be used as the sole basis for treatment or other patient management decisions, including infection  control decisions, particularly in the presence of clinical signs and  symptoms consistent with COVID-19, or in those who have been in contact with the virus.  Negative results must be combined with clinical observations, patient history, and epidemiological information. The expected result is Negative.  Fact Sheet for Patients: HandmadeRecipes.com.cy  Fact Sheet for Healthcare Providers: FuneralLife.at  This test is not yet approved or cleared by the [place] FDA and  has been authorized for detection and/or diagnosis of SARS-CoV-2 by FDA under an Emergency Use Authorization (EUA).  This EUA will remain in effect (meaning this test can be used) for the duration of  the COV                          ID-19 declaration under Section 564(b)(1) of the Act, 21 U.S.C. section 360bbb-3(b)(1), unless the authorization is terminated or revoked sooner.     SARS Coronavirus 2 Ag 01/07/2021 Negative  Negative Preliminary  Appointment on 01/06/2021  Component Date Value Ref Range Status   CD4 T Cell Abs 01/06/2021 910  400 - 1,790 /uL Final   CD4 % Helper T Cell 01/06/2021 36  33 - 65 % Final   Performed at [facility],  [place] [address] [place], [place] [address]  Admission on 01/02/2021, Discharged on 01/03/2021  Component Date Value Ref Range Status   Lipase 01/03/2021 44  11 - 51 U/L Final   Performed at  [facility], [place] [address] [place], [name] [address]   Sodium 01/03/2021 137  135 - 145 mmol/L Final   Potassium 01/03/2021 4.2  3.5 - 5.1 mmol/L Final   Chloride 01/03/2021 101  98 - 111 mmol/L Final   CO2 01/03/2021 30  22 - 32 mmol/L Final   Glucose, Bld 01/03/2021 123 (H)  70 - 99 mg/dL Final   Glucose reference range applies only to samples taken after fasting for at least 8 hours.   BUN 01/03/2021 12  6 - 20 mg/dL Final   Creatinine, Ser 01/03/2021 1.12  0.61 - 1.24 mg/dL Final   Calcium 01/03/2021 10.0  8.9 - 10.3 mg/dL Final   Total Protein 01/03/2021 7.6  6.5 - 8.1 g/dL Final   Albumin 01/03/2021 4.1  3.5 - 5.0 g/dL Final   AST 01/03/2021 27  15 - 41 U/L Final   ALT 01/03/2021 41  0 - 44 U/L Final   Alkaline Phosphatase 01/03/2021 110  38 - 126 U/L Final   Total Bilirubin 01/03/2021 0.5  0.3 - 1.2 mg/dL Final   GFR, Estimated 01/03/2021 >60  >60 mL/min Final   Comment: (NOTE) Calculated using the CKD-EPI Creatinine Equation (2021)    Anion gap 01/03/2021 6  5 - 15 Final   Performed at [facility] [address] [place], [name] [address]   WBC 01/03/2021 10.4  4.0 - 10.5 K/uL Final   RBC 01/03/2021 5.04  4.22 - 5.81 MIL/uL Final   Hemoglobin 01/03/2021 15.4  13.0 - 17.0 g/dL Final   HCT 01/03/2021 46.0  39.0 - 52.0 % Final   MCV 01/03/2021 91.3  80.0 - 100.0 fL Final   MCH 01/03/2021 30.6  26.0 - 34.0 pg Final   MCHC 01/03/2021 33.5  30.0 - 36.0 g/dL Final   RDW 01/03/2021 12.8  11.5 - 15.5 % Final   Platelets 01/03/2021 231  150 - 400 K/uL Final   nRBC 01/03/2021 0.0  0.0 - 0.2 % Final   Performed at [facility] [address], [place], [name] [address]   Color, Urine 01/03/2021 YELLOW  YELLOW Final   APPearance 01/03/2021 HAZY (A)  CLEAR Final   Specific Gravity, Urine 01/03/2021 1.015  1.005 - 1.030 Final   pH 01/03/2021 8.0  5.0 - 8.0 Final   Glucose, UA 01/03/2021 NEGATIVE  NEGATIVE mg/dL Final   Hgb urine dipstick 01/03/2021 NEGATIVE   NEGATIVE Final   Bilirubin Urine 01/03/2021 NEGATIVE  NEGATIVE Final   Ketones, ur 01/03/2021 NEGATIVE  NEGATIVE mg/dL Final   Protein, ur 01/03/2021 NEGATIVE  NEGATIVE mg/dL Final   Nitrite 01/03/2021 NEGATIVE  NEGATIVE Final   Leukocytes,Ua 01/03/2021 NEGATIVE  NEGATIVE Final   Comment: Microscopic not done on urines with negative protein, blood, leukocytes, nitrite, or glucose < 500 mg/dL. Performed at [facility], [place] [address], [place], [place] [address]   Admission on 10/25/2020, Discharged on 10/31/2020  Component Date Value Ref Range Status   SARS Coronavirus 2 by RT PCR 10/26/2020 NEGATIVE  NEGATIVE Final   Comment: (NOTE) SARS-CoV-2 target nucleic acids are NOT DETECTED.  The SARS-CoV-2 RNA is generally detectable in upper respiratory specimens during the acute phase of infection. The lowest concentration of SARS-CoV-2 viral copies this  assay can detect is 138 copies/mL. A negative result does not preclude SARS-Cov-2 infection and should not be used as the sole basis for treatment or other patient management decisions. A negative result may occur with  improper specimen collection/handling, submission of specimen other than nasopharyngeal swab, presence of viral mutation(s) within the areas targeted by this assay, and inadequate number of viral copies(<138 copies/mL). A negative result must be combined with clinical observations, patient history, and epidemiological information. The expected result is Negative.  Fact Sheet for Patients:  EntrepreneurPulse.com.au  Fact Sheet for Healthcare Providers:  IncredibleEmployment.be  This test is no                          t yet approved or cleared by the [place] FDA and  has been authorized for detection and/or diagnosis of SARS-CoV-2 by FDA under an Emergency Use Authorization (EUA). This EUA will remain  in effect (meaning this test can be used) for the duration of  the COVID-19 declaration under Section 564(b)(1) of the Act, 21 U.S.C.section 360bbb-3(b)(1), unless the authorization is terminated  or revoked sooner.       Influenza A by PCR 10/26/2020 NEGATIVE  NEGATIVE Final   Influenza B by PCR 10/26/2020 NEGATIVE  NEGATIVE Final   Comment: (NOTE) The Xpert Xpress SARS-CoV-2/FLU/RSV plus assay is intended as an aid in the diagnosis of influenza from Nasopharyngeal swab specimens and should not be used as a sole basis for treatment. Nasal washings and aspirates are unacceptable for Xpert Xpress SARS-CoV-2/FLU/RSV testing.  Fact Sheet for Patients: EntrepreneurPulse.com.au  Fact Sheet for Healthcare Providers: IncredibleEmployment.be  This test is not yet approved or cleared by the [place] FDA and has been authorized for detection and/or diagnosis of SARS-CoV-2 by FDA under an Emergency Use Authorization (EUA). This EUA will remain in effect (meaning this test can be used) for the duration of the COVID-19 declaration under Section 564(b)(1) of the Act, 21 U.S.C. section 360bbb-3(b)(1), unless the authorization is terminated or revoked.  Performed at [facility], [name] [address] [place], [name] [address]    SARS Coronavirus 2 Ag 10/26/2020 Negative  Negative Preliminary   WBC 10/26/2020 7.7  4.0 - 10.5 K/uL Final   RBC 10/26/2020 4.58  4.22 - 5.81 MIL/uL Final   Hemoglobin 10/26/2020 14.1  13.0 - 17.0 g/dL Final   HCT 10/26/2020 41.4  39.0 - 52.0 % Final   MCV 10/26/2020 90.4  80.0 - 100.0 fL Final   MCH 10/26/2020 30.8  26.0 - 34.0 pg Final   MCHC 10/26/2020 34.1  30.0 - 36.0 g/dL Final   RDW 10/26/2020 12.6  11.5 - 15.5 % Final   Platelets 10/26/2020 218  150 - 400 K/uL Final   nRBC 10/26/2020 0.0  0.0 - 0.2 % Final   Neutrophils Relative % 10/26/2020 58  % Final   Neutro Abs 10/26/2020 4.4  1.7 - 7.7 K/uL Final   Lymphocytes Relative 10/26/2020 36  % Final   Lymphs Abs 10/26/2020  2.8  0.7 - 4.0 K/uL Final   Monocytes Relative 10/26/2020 5  % Final   Monocytes Absolute 10/26/2020 0.4  0.1 - 1.0 K/uL Final   Eosinophils Relative 10/26/2020 1  % Final   Eosinophils Absolute 10/26/2020 0.1  0.0 - 0.5 K/uL Final   Basophils Relative 10/26/2020 0  % Final   Basophils Absolute 10/26/2020 0.0  0.0 - 0.1 K/uL Final   Immature Granulocytes 10/26/2020 0  %  Final   Abs Immature Granulocytes 10/26/2020 0.00  0.00 - 0.07 K/uL Final   Performed at [facility], [place] [address] [place], [name] [address]   Sodium 10/26/2020 136  135 - 145 mmol/L Final   Potassium 10/26/2020 3.8  3.5 - 5.1 mmol/L Final   Chloride 10/26/2020 105  98 - 111 mmol/L Final   CO2 10/26/2020 21 (L)  22 - 32 mmol/L Final   Glucose, Bld 10/26/2020 151 (H)  70 - 99 mg/dL Final   Glucose reference range applies only to samples taken after fasting for at least 8 hours.   BUN 10/26/2020 15  6 - 20 mg/dL Final   Creatinine, Ser 10/26/2020 1.16  0.61 - 1.24 mg/dL Final   Calcium 10/26/2020 9.7  8.9 - 10.3 mg/dL Final   Total Protein 10/26/2020 6.9  6.5 - 8.1 g/dL Final   Albumin 10/26/2020 4.1  3.5 - 5.0 g/dL Final   AST 10/26/2020 34  15 - 41 U/L Final   ALT 10/26/2020 38  0 - 44 U/L Final   Alkaline Phosphatase 10/26/2020 91  38 - 126 U/L Final   Total Bilirubin 10/26/2020 0.7  0.3 - 1.2 mg/dL Final   GFR, Estimated 10/26/2020 >60  >60 mL/min Final   Comment: (NOTE) Calculated using the CKD-EPI Creatinine Equation (2021)    Anion gap 10/26/2020 10  5 - 15 Final   Performed at [facility] [address], [place], [name] [address]   Hgb A1c MFr Bld 10/26/2020 6.0 (H)  4.8 - 5.6 % Final   Comment: (NOTE) Pre diabetes:          5.7%-6.4%  Diabetes:              >6.4%  Glycemic control for   <7.0% adults with diabetes    Mean Plasma Glucose 10/26/2020 125.5  mg/dL Final   Performed at [facility], [place] [address] [place], [place] [address]   Alcohol, Ethyl (B) 10/26/2020 <10  <10  mg/dL Final   Comment: (NOTE) Lowest detectable limit for serum alcohol is 10 mg/dL.  For medical purposes only. Performed at [facility], [place] [address] [place], [place] [address]    Cholesterol 10/26/2020 153  0 - 200 mg/dL Final   Triglycerides 10/26/2020 80  <150 mg/dL Final   HDL 10/26/2020 43  >40 mg/dL Final   Total CHOL/HDL Ratio 10/26/2020 3.6  RATIO Final   VLDL 10/26/2020 16  0 - 40 mg/dL Final   LDL Cholesterol 10/26/2020 94  0 - 99 mg/dL Final   Comment:        Total Cholesterol/HDL:CHD Risk Coronary Heart Disease Risk Table                     Men   Women  1/2 Average Risk   3.4   3.3  Average Risk       5.0   4.4  2 X Average Risk   9.6   7.1  3 X Average Risk  23.4   11.0        Use the calculated Patient Ratio above and the CHD Risk Table to determine the patient's CHD Risk.        ATP III CLASSIFICATION (LDL):  <100     mg/dL   Optimal  100-129  mg/dL   Near or Above  Optimal  130-159  mg/dL   Borderline  160-189  mg/dL   High  >190     mg/dL   Very High Performed at [facility] [address] [place], [place] [address]    TSH 10/26/2020 0.512  0.350 - 4.500 uIU/mL Final   Comment: Performed by a 3rd Generation assay with a functional sensitivity of <=0.01 uIU/mL. Performed at [facility], [place] [address], [place], [name] [address]    POC Amphetamine UR 10/26/2020 None Detected  NONE DETECTED (Cut Off Level 1000 ng/mL) Final   POC Secobarbital (BAR) 10/26/2020 None Detected  NONE DETECTED (Cut Off Level 300 ng/mL) Final   POC Buprenorphine (BUP) 10/26/2020 None Detected  NONE DETECTED (Cut Off Level 10 ng/mL) Final   POC Oxazepam (BZO) 10/26/2020 Positive (A)  NONE DETECTED (Cut Off Level 300 ng/mL) Final   POC Cocaine UR 10/26/2020 Positive (A)  NONE DETECTED (Cut Off Level 300 ng/mL) Final   POC Methamphetamine UR 10/26/2020 None Detected  NONE DETECTED (Cut Off Level 1000 ng/mL) Final   POC Morphine  10/26/2020 None Detected  NONE DETECTED (Cut Off Level 300 ng/mL) Final   POC Oxycodone UR 10/26/2020 None Detected  NONE DETECTED (Cut Off Level 100 ng/mL) Final   POC Methadone UR 10/26/2020 None Detected  NONE DETECTED (Cut Off Level 300 ng/mL) Final   POC Marijuana UR 10/26/2020 None Detected  NONE DETECTED (Cut Off Level 50 ng/mL) Final   Group A Strep by PCR 10/30/2020 NOT DETECTED  NOT DETECTED Final   Performed at [facility], [place] [address], [place], [place] [address]  Admission on 08/24/2020, Discharged on 08/24/2020  Component Date Value Ref Range Status   WBC 08/24/2020 11.5 (H)  4.0 - 10.5 K/uL Final   RBC 08/24/2020 4.93  4.22 - 5.81 MIL/uL Final   Hemoglobin 08/24/2020 15.0  13.0 - 17.0 g/dL Final   HCT 08/24/2020 46.0  39.0 - 52.0 % Final   MCV 08/24/2020 93.3  80.0 - 100.0 fL Final   MCH 08/24/2020 30.4  26.0 - 34.0 pg Final   MCHC 08/24/2020 32.6  30.0 - 36.0 g/dL Final   RDW 08/24/2020 13.0  11.5 - 15.5 % Final   Platelets 08/24/2020 217  150 - 400 K/uL Final   nRBC 08/24/2020 0.0  0.0 - 0.2 % Final   Neutrophils Relative % 08/24/2020 73  % Final   Neutro Abs 08/24/2020 8.3 (H)  1.7 - 7.7 K/uL Final   Lymphocytes Relative 08/24/2020 20  % Final   Lymphs Abs 08/24/2020 2.3  0.7 - 4.0 K/uL Final   Monocytes Relative 08/24/2020 7  % Final   Monocytes Absolute 08/24/2020 0.8  0.1 - 1.0 K/uL Final   Eosinophils Relative 08/24/2020 0  % Final   Eosinophils Absolute 08/24/2020 0.0  0.0 - 0.5 K/uL Final   Basophils Relative 08/24/2020 0  % Final   Basophils Absolute 08/24/2020 0.0  0.0 - 0.1 K/uL Final   Immature Granulocytes 08/24/2020 0  % Final   Abs Immature Granulocytes 08/24/2020 0.04  0.00 - 0.07 K/uL Final   Performed at [facility], [place] [address] [place], [name] [address]   Sodium 08/24/2020 138  135 - 145 mmol/L Final   Potassium 08/24/2020 4.3  3.5 - 5.1 mmol/L Final   Chloride 08/24/2020 103  98 - 111 mmol/L Final   CO2 08/24/2020  26  22 - 32 mmol/L Final   Glucose, Bld 08/24/2020 88  70 - 99 mg/dL Final   Glucose reference range applies only to samples taken after fasting for at least 8 hours.   BUN 08/24/2020 15  6 - 20 mg/dL Final   Creatinine, Ser 08/24/2020 1.21  0.61 - 1.24 mg/dL Final   Calcium 08/24/2020 9.8  8.9 - 10.3 mg/dL Final   Total Protein 08/24/2020 8.7 (H)  6.5 - 8.1 g/dL Final   Albumin 08/24/2020 4.8  3.5 - 5.0 g/dL Final   AST 08/24/2020 49 (H)  15 - 41 U/L Final   ALT 08/24/2020 47 (H)  0 - 44 U/L Final   Alkaline Phosphatase 08/24/2020 90  38 - 126 U/L Final   Total Bilirubin 08/24/2020 0.7  0.3 - 1.2 mg/dL Final   GFR, Estimated 08/24/2020 >60  >60 mL/min Final   Comment: (NOTE) Calculated using the CKD-EPI Creatinine Equation (2021)    Anion gap 08/24/2020 9  5 - 15 Final   Performed at [facility], [place] [address], [place], [place] [address]   Alcohol, Ethyl (B) 08/24/2020 <10  <10 mg/dL Final   Comment: (NOTE) Lowest detectable limit for serum alcohol is 10 mg/dL.  For medical purposes only. Performed at [facility], [place] [address], [place], [name] [address]    Acetaminophen (Tylenol), Serum 08/24/2020 <10 (L)  10 - 30 ug/mL Final   Comment: (NOTE) Therapeutic concentrations vary significantly. A range of 10-30 ug/mL  may be an effective concentration for many patients. However, some  are best treated at concentrations outside of this range. Acetaminophen concentrations >150 ug/mL at 4 hours after ingestion  and >50 ug/mL at 12 hours after ingestion are often associated with  toxic reactions.  Performed at [facility], [place] [address], [place], [name] [address]    Troponin I (High Sensitivity) 08/24/2020 5  <18 ng/L Final   Comment: (NOTE) Elevated high sensitivity troponin I (hsTnI) values and significant  changes across serial measurements may suggest ACS but many other  chronic and acute conditions are known to  elevate hsTnI results.  Refer to the "Links" section for chest pain algorithms and additional  guidance. Performed at [facility], [place] [address], [place], [place] [address]    SARS Coronavirus 2 by RT PCR 08/24/2020 NEGATIVE  NEGATIVE Final   Comment: (NOTE) SARS-CoV-2 target nucleic acids are NOT DETECTED.  The SARS-CoV-2 RNA is generally detectable in upper respiratory specimens during the acute phase of infection. The lowest concentration of SARS-CoV-2 viral copies this assay can detect is 138 copies/mL. A negative result does not preclude SARS-Cov-2 infection and should not be used as the sole basis for treatment or other patient management decisions. A negative result may occur with  improper specimen collection/handling, submission of specimen other than nasopharyngeal swab, presence of viral mutation(s) within the areas targeted by this assay, and inadequate number of viral copies(<138 copies/mL). A negative result must be combined with clinical observations, patient history, and epidemiological information. The expected result is Negative.  Fact Sheet for Patients:  EntrepreneurPulse.com.au  Fact Sheet for Healthcare Providers:  IncredibleEmployment.be  This test is no                          t yet approved or cleared by the [place] FDA and  has been authorized for detection and/or diagnosis of SARS-CoV-2 by FDA under an Emergency Use Authorization (EUA). This EUA will remain  in effect (meaning this test  can be used) for the duration of the COVID-19 declaration under Section 564(b)(1) of the Act, 21 U.S.C.section 360bbb-3(b)(1), unless the authorization is terminated  or revoked sooner.       Influenza A by PCR 08/24/2020 NEGATIVE  NEGATIVE Final   Influenza B by PCR 08/24/2020 NEGATIVE  NEGATIVE Final   Comment: (NOTE) The Xpert Xpress SARS-CoV-2/FLU/RSV plus assay is intended as an aid in the  diagnosis of influenza from Nasopharyngeal swab specimens and should not be used as a sole basis for treatment. Nasal washings and aspirates are unacceptable for Xpert Xpress SARS-CoV-2/FLU/RSV testing.  Fact Sheet for Patients: EntrepreneurPulse.com.au  Fact Sheet for Healthcare Providers: IncredibleEmployment.be  This test is not yet approved or cleared by the [place] FDA and has been authorized for detection and/or diagnosis of SARS-CoV-2 by FDA under an Emergency Use Authorization (EUA). This EUA will remain in effect (meaning this test can be used) for the duration of the COVID-19 declaration under Section 564(b)(1) of the Act, 21 U.S.C. section 360bbb-3(b)(1), unless the authorization is terminated or revoked.  Performed at [facility], [place] [address] [place], [place] [address]   Office Visit on 07/14/2020  Component Date Value Ref Range Status   Vitamin B-12 07/14/2020 403  200 - 1,100 pg/mL Final   Sed Rate 07/14/2020 2  0 - 20 mm/h Final   T3 Uptake 07/14/2020 29  22 - 35 % Final   T4, Total 07/14/2020 6.4  4.9 - 10.5 mcg/dL Final   Free Thyroxine Index 07/14/2020 1.9  1.4 - 3.8 Final   TSH 07/14/2020 1.59  0.40 - 4.50 mIU/L Final    Allergies: Penicillins, Latex, and Tape  PTA Medications: (Not in a hospital admission)   Medical Decision Making  Patient will be admitted to [facility] for crisis stabilization and safety -Continue home medication -Obtain labs/reviewed available results -Monitor for safety    Recommendations  Based on my evaluation the patient does not appear to have an emergency medical condition.  Ophelia Shoulder, NP 01/07/21  10:20 PM

## 2021-01-08 ENCOUNTER — Other Ambulatory Visit: Payer: Self-pay

## 2021-01-08 DIAGNOSIS — R45851 Suicidal ideations: Secondary | ICD-10-CM | POA: Diagnosis not present

## 2021-01-08 DIAGNOSIS — Z20822 Contact with and (suspected) exposure to covid-19: Secondary | ICD-10-CM | POA: Diagnosis not present

## 2021-01-08 DIAGNOSIS — F251 Schizoaffective disorder, depressive type: Secondary | ICD-10-CM | POA: Diagnosis not present

## 2021-01-08 LAB — LIPID PANEL
Cholesterol: 266 mg/dL — ABNORMAL HIGH (ref 0–200)
HDL: 59 mg/dL (ref >40)
LDL Cholesterol: 191 mg/dL — ABNORMAL HIGH (ref 0–99)
Total CHOL/HDL Ratio: 4.5 RATIO
Triglycerides: 81 mg/dL (ref <150)
VLDL: 16 mg/dL (ref 0–40)

## 2021-01-08 LAB — CBC WITH DIFFERENTIAL/PLATELET
Abs Immature Granulocytes: 0.03 10*3/uL (ref 0.00–0.07)
Basophils Absolute: 0 10*3/uL (ref 0.0–0.1)
Basophils Relative: 0 %
Eosinophils Absolute: 0 10*3/uL (ref 0.0–0.5)
Eosinophils Relative: 0 %
HCT: 47.4 % (ref 39.0–52.0)
Hemoglobin: 15.4 g/dL (ref 13.0–17.0)
Immature Granulocytes: 0 %
Lymphocytes Relative: 22 %
Lymphs Abs: 2.3 10*3/uL (ref 0.7–4.0)
MCH: 29.7 pg (ref 26.0–34.0)
MCHC: 32.5 g/dL (ref 30.0–36.0)
MCV: 91.3 fL (ref 80.0–100.0)
Monocytes Absolute: 0.7 10*3/uL (ref 0.1–1.0)
Monocytes Relative: 7 %
Neutro Abs: 7.4 10*3/uL (ref 1.7–7.7)
Neutrophils Relative %: 71 %
Platelets: 193 10*3/uL (ref 150–400)
RBC: 5.19 MIL/uL (ref 4.22–5.81)
RDW: 13.1 % (ref 11.5–15.5)
WBC: 10.5 10*3/uL (ref 4.0–10.5)
nRBC: 0 % (ref 0.0–0.2)

## 2021-01-08 LAB — COMPREHENSIVE METABOLIC PANEL
ALT: 57 U/L — ABNORMAL HIGH (ref 0–44)
ALT: 56 U/L — ABNORMAL HIGH (ref 0–44)
AST: 102 U/L — ABNORMAL HIGH (ref 15–41)
AST: 109 U/L — ABNORMAL HIGH (ref 15–41)
Albumin: 4.8 g/dL (ref 3.5–5.0)
Albumin: 4 g/dL (ref 3.5–5.0)
Alkaline Phosphatase: 129 U/L — ABNORMAL HIGH (ref 38–126)
Alkaline Phosphatase: 105 U/L (ref 38–126)
Anion gap: 11 (ref 5–15)
Anion gap: 11 (ref 5–15)
BUN: 19 mg/dL (ref 6–20)
BUN: 22 mg/dL — ABNORMAL HIGH (ref 6–20)
CO2: 24 mmol/L (ref 22–32)
CO2: 23 mmol/L (ref 22–32)
Calcium: 9.9 mg/dL (ref 8.9–10.3)
Calcium: 9.4 mg/dL (ref 8.9–10.3)
Chloride: 98 mmol/L (ref 98–111)
Chloride: 103 mmol/L (ref 98–111)
Creatinine, Ser: 1.26 mg/dL — ABNORMAL HIGH (ref 0.61–1.24)
Creatinine, Ser: 1.19 mg/dL (ref 0.61–1.24)
GFR, Estimated: >60 mL/min (ref >60)
GFR, Estimated: >60 mL/min (ref >60)
Glucose, Bld: 103 mg/dL — ABNORMAL HIGH (ref 70–99)
Glucose, Bld: 123 mg/dL — ABNORMAL HIGH (ref 70–99)
Potassium: 4.1 mmol/L (ref 3.5–5.1)
Potassium: 3.7 mmol/L (ref 3.5–5.1)
Sodium: 133 mmol/L — ABNORMAL LOW (ref 135–145)
Sodium: 137 mmol/L (ref 135–145)
Total Bilirubin: 0.8 mg/dL (ref 0.3–1.2)
Total Bilirubin: 0.9 mg/dL (ref 0.3–1.2)
Total Protein: 8.4 g/dL — ABNORMAL HIGH (ref 6.5–8.1)
Total Protein: 7.2 g/dL (ref 6.5–8.1)

## 2021-01-08 LAB — RESP PANEL BY RT-PCR (FLU A&B, COVID) ARPGX2
Influenza A by PCR: NEGATIVE
Influenza B by PCR: NEGATIVE
SARS Coronavirus 2 by RT PCR: NEGATIVE

## 2021-01-08 LAB — ETHANOL: Alcohol, Ethyl (B): <10 mg/dL (ref <10)

## 2021-01-08 MED ORDER — NICOTINE 21 MG/24HR TD PT24
21.0000 mg | MEDICATED_PATCH | Freq: Once | TRANSDERMAL | Status: AC
  Administered 2021-01-08: 21 mg via TRANSDERMAL
  Filled 2021-01-08: qty 1

## 2021-01-08 MED ORDER — QUETIAPINE FUMARATE 100 MG PO TABS
250.0000 mg | ORAL_TABLET | Freq: Every day | ORAL | Status: DC
  Administered 2021-01-08 – 2021-01-10 (×3): 250 mg via ORAL
  Filled 2021-01-08: qty 3
  Filled 2021-01-08: qty 35
  Filled 2021-01-08: qty 3
  Filled 2021-01-08: qty 70
  Filled 2021-01-08 (×2): qty 3

## 2021-01-08 MED ORDER — NICOTINE POLACRILEX 2 MG MT GUM
2.0000 mg | CHEWING_GUM | Freq: Three times a day (TID) | OROMUCOSAL | Status: DC | PRN
  Administered 2021-01-08 – 2021-01-10 (×2): 2 mg via ORAL
  Filled 2021-01-08: qty 20
  Filled 2021-01-08 (×2): qty 1

## 2021-01-08 NOTE — ED Notes (Signed)
Appears to be resting quietly at this time , eyes closed , respirations even and unlabored , no distress noted , will continue to monitor for safety .

## 2021-01-08 NOTE — ED Notes (Signed)
Pt in room asleep.  Breathing is unlabored and even. Will continue to monitor for safety.

## 2021-01-08 NOTE — ED Notes (Signed)
Pt was given [place] sandwich and apple juice. Pt also requested some scrubs to put on

## 2021-01-08 NOTE — ED Notes (Addendum)
Pt admitted to [facility] due to [place] with plan to jump off parking deck and AVH. Pt continues to endorse SI but states that he does not have a plan. Pt denies current HI/AVH. Pt A&O x4, cooperative but irritable during admission process and skin assessment. Pt ambulated independently to unit, gait is slow and cautious and pt reports having "screws in right foot." Pt oriented to unit/staff. No signs of acute distress noted. Will continue to monitor for safety.

## 2021-01-08 NOTE — ED Notes (Signed)
Sitting in dayroom eating breakfast.  No complaints of pain or discomfort at this time.  Will continue to monitor for safety.

## 2021-01-08 NOTE — ED Notes (Signed)
Alert and oriented x4 , lying in bed awake , reports feelings of anxiety and depression , denies SI / HI and AVH , somewhat irritabile but generally cooperative , no behavioral or management issues at this time

## 2021-01-08 NOTE — ED Notes (Signed)
Patient remains asleep in bed without complaint or distress.

## 2021-01-08 NOTE — ED Notes (Signed)
Pt asleep in bed. Respirations even and unlabored. Will continue to monitor for safety. ?

## 2021-01-08 NOTE — ED Provider Notes (Signed)
Behavioral Health Progress Note  Date and Time: 01/08/2021 11:51 AM Name: Dylan [name] MRN:  [id number]  Subjective:   Pt seen in late AM, around 11. Had to be awoken for interview, is drowsy through interview and falls asleep multiple times. He is fully oriented.  We discussed his recent engagement in health-promoting behaviors, namely compliance with his antiretroviral medication. He endorses worsening depression over last several months, but acknowledges that things were better before he ran out of his meds. Depression additionally triggered by loss of daughter and aunt within the last year  It sounds like he had been compliant with all meds other than his quetiapine and trazodone; pt considers his duloxetine and gabapentin medications for his ft (likely prescribed for neuropathy). Attempted to discuss increase of medication to further target depression but pt nods off.   Endorsed unknown VH last night; refused to discuss content. Became agitated with this question and further questions about psych history stating "I've said all of this before, it should be in my chart". Also upset about missing breakfast and focused on this through interview.   Endorsed ongoing SI, denies HI and AH. Endorsed VH as above. Oversedated and not open to d/c trazodone or make PRN (states normally takes seroquel around dinner and trazodone before bed).   Diagnosis:  Final diagnoses:  Schizoaffective disorder, depressive type ([place])  Suicidal ideation    Total Time spent with patient: 20 minutes  Past Psychiatric History:   Schizoaffective disorder per chart; pt not amenable to f/u questions.  Unclear how often he has been using substances recently. Was able to state he drinks occasionally and has never had any sort of w/d. Does vape an unknown amount daily, amenable to nicotine patch.   Past Medical History:  Past Medical History:  Diagnosis Date   Anxiety    Arthritis    Bipolar 1 disorder ([place])     Colon polyps    Depression    GERD (gastroesophageal reflux disease)    Hepatitis B    Human immunodeficiency virus (HIV) ([place])    Hyperlipidemia    Hypertension    Neuromuscular disorder ([place])    neuropathy   Neuropathy    Pre-diabetes    Schizophrenia ([place])     Past Surgical History:  Procedure Laterality Date   FOOT ARTHRODESIS Right 09/17/2019   Procedure: FUSION RIGHT LISFRANC JOINT;  Surgeon: Newt Minion, MD;  Location: [facility];  Service: Orthopedics;  Laterality: Right;   FOOT ARTHRODESIS Right 09/2019   HEMORROIDECTOMY     IR FLUORO GUIDE CV LINE RIGHT  02/22/2017   IR US GUIDE VASC ACCESS RIGHT  02/22/2017   TUMOR REMOVAL     From Chest   Family History:  Family History  Problem Relation Age of Onset   CAD Mother    Diabetes Father    Colon cancer Maternal Aunt 60   Diabetes Maternal Aunt    Heart disease Maternal Uncle    Esophageal cancer Neg Hx    Rectal cancer Neg Hx    Stomach cancer Neg Hx    Family Psychiatric  History: Unknown Social History:  Social History   Substance and Sexual Activity  Alcohol Use Yes   Comment: occassional use on weekends     Social History   Substance and Sexual Activity  Drug Use Yes   Types: Cocaine, Marijuana   Comment: last use was Friday 03/05/2020    Social History   Socioeconomic History   Marital status: Married  Spouse name: Not on file   Number of children: 1   Years of education: Not on file   Highest education level: Not on file  Occupational History   Not on file  Tobacco Use   Smoking status: Every Day    Packs/day: 0.50    Years: 15.00    Pack years: 7.50    Types: E-cigarettes, Cigarettes   Smokeless tobacco: Never   Tobacco comments:    has patches when he is ready to quit  Vaping Use   Vaping Use: Some days   Substances: Nicotine  Substance and Sexual Activity   Alcohol use: Yes    Comment: occassional use on weekends   Drug use: Yes    Types: Cocaine, Marijuana    Comment: last use  was Friday 03/05/2020   Sexual activity: Yes    Partners: Female    Birth control/protection: Condom    Comment: condoms given  Other Topics Concern   Not on file  Social History Narrative   Not on file   Social Determinants of Health   Financial Resource Strain: Not on file  Food Insecurity: Not on file  Transportation Needs: Not on file  Physical Activity: Not on file  Stress: Not on file  Social Connections: Not on file   SDOH:  SDOH Screenings   Alcohol Screen: Low Risk    Last Alcohol Screening Score (AUDIT): 3  Depression (PHQ2-9): Medium Risk   PHQ-2 Score: 15  Financial Resource Strain: Not on file  Food Insecurity: Not on file  Housing: Not on file  Physical Activity: Not on file  Social Connections: Not on file  Stress: Not on file  Tobacco Use: High Risk   Smoking Tobacco Use: Every Day   Smokeless Tobacco Use: Never   Passive Exposure: Not on file  Transportation Needs: Not on file   Additional Social History:    Pain Medications: See MAR Prescriptions: See MAR Over the Counter: See MAR History of alcohol / drug use?: Yes Longest period of sobriety (when/how long): 2 years Negative Consequences of Use: Financial, Legal Withdrawal Symptoms: None Name of Substance 1: Alcohol 1 - Age of First Use: Adolescent 1 - Amount (size/oz): 1-2 beers 1 - Frequency: 1-2 times per week 1 - Duration: Ongoing 1 - Last Use / Amount: 12/28/2020, 2 cans of beer 1 - Method of Aquiring: Store 1- Route of Use: Oral ingestion Name of Substance 2: Marijuana 2 - Age of First Use: 16 2 - Amount (size/oz): 1-2 joints 2 - Frequency: 4-5 times per month 2 - Duration: Ongoing 2 - Last Use / Amount: 3 days ago 2 - Method of Aquiring: unknown 2 - Route of Substance Use: Smoking Name of Substance 3: Cocaine 3 - Age of First Use: unknown 3 - Amount (size/oz): Varies 3 - Frequency: Approximately once per month 3 - Duration: Ongoing 3 - Last Use / Amount: 3 days ago 3 -  Method of Aquiring: unknown 3 - Route of Substance Use: Inhaling              Sleep:  too much  Appetite:  Good  Current Medications:  Current Facility-Administered Medications  Medication Dose Route Frequency Provider Last Rate Last Admin   acetaminophen (TYLENOL) tablet 650 mg  650 mg Oral Q6H PRN Ajibola, Ene A, NP       alum & mag hydroxide-simeth (MAALOX/MYLANTA) 200-200-20 MG/5ML suspension 30 mL  30 mL Oral Q4H PRN Ajibola, Ene A, NP  bictegravir-emtricitabine-tenofovir AF (BIKTARVY) 50-200-25 MG per tablet 1 tablet  1 tablet Oral Daily Ajibola, Ene A, NP   1 tablet at 01/08/21 0917   DULoxetine (CYMBALTA) DR capsule 60 mg  60 mg Oral Daily Ajibola, Ene A, NP   60 mg at 01/08/21 4742   gabapentin (NEURONTIN) capsule 600 mg  600 mg Oral q AM Ajibola, Ene A, NP   600 mg at 01/08/21 0919   gabapentin (NEURONTIN) capsule 900 mg  900 mg Oral QHS Ajibola, Ene A, NP   900 mg at 01/07/21 2224   hydrOXYzine (ATARAX) tablet 25 mg  25 mg Oral TID PRN Ajibola, Ene A, NP       magnesium hydroxide (MILK OF MAGNESIA) suspension 30 mL  30 mL Oral Daily PRN Ajibola, Ene A, NP       nicotine (NICODERM CQ - dosed in mg/24 hours) patch 21 mg  21 mg Transdermal Once Jamilee Lafosse A       nicotine polacrilex (NICORETTE) gum 2 mg  2 mg Oral TID PRN Armari Fussell A       QUEtiapine (SEROQUEL) tablet 250 mg  250 mg Oral QHS Ajibola, Ene A, NP   250 mg at 01/07/21 2225   rosuvastatin (CRESTOR) tablet 20 mg  20 mg Oral Daily Ajibola, Ene A, NP   20 mg at 01/08/21 0917   traZODone (DESYREL) tablet 150 mg  150 mg Oral QHS Ajibola, Ene A, NP   150 mg at 01/07/21 2224   Current Outpatient Medications  Medication Sig Dispense Refill   bictegravir-emtricitabine-tenofovir AF (BIKTARVY) 50-200-25 MG TABS tablet Take 1 tablet by mouth daily. 30 tablet 6   DULoxetine (CYMBALTA) 60 MG capsule Take 1 capsule (60 mg total) by mouth daily. For neuropathy 14 capsule 0   gabapentin (NEURONTIN) 300  MG capsule Take orally 2 caps (600mg ) in the morning and 3 caps (900mg ) in the evening 70 capsule 0   gabapentin (NEURONTIN) 300 MG capsule Take 2 capsules (600 mg total) by mouth 2 (two) times daily. 14 capsule 0   guaiFENesin (MUCINEX) 600 MG 12 hr tablet Take 1 tablet (600 mg total) by mouth 2 (two) times daily as needed for cough or to loosen phlegm (congestion). 14 tablet 0   hydrOXYzine (ATARAX/VISTARIL) 25 MG tablet Take 1 tablet (25 mg total) by mouth 3 (three) times daily as needed for anxiety. 42 tablet 0   meloxicam (MOBIC) 7.5 MG tablet Take 1 tablet (7.5 mg total) by mouth daily. 30 tablet 0   menthol-cetylpyridinium (CEPACOL) 3 MG lozenge Take 1 lozenge (3 mg total) by mouth as needed for sore throat (sore throat). 100 tablet 12   phenol (CHLORASEPTIC) 1.4 % LIQD Use as directed 1 spray in the mouth or throat as needed for throat irritation / pain.  0   QUEtiapine (SEROQUEL) 200 MG tablet Take 1 tablet (200 mg total) by mouth at bedtime. 30 tablet 1   QUEtiapine (SEROQUEL) 50 MG tablet Take 5 tablets (250 mg total) by mouth at bedtime. 70 tablet 0   rosuvastatin (CRESTOR) 20 MG tablet Take 1 tablet (20 mg total) by mouth daily. 30 tablet 6   traZODone (DESYREL) 150 MG tablet Take 1 tablet (150 mg total) by mouth at bedtime. 30 tablet 0    Labs  Lab Results:  Admission on 01/07/2021  Component Date Value Ref Range Status   SARS Coronavirus 2 by RT PCR 01/07/2021 NEGATIVE  NEGATIVE Final   Comment: (NOTE) SARS-CoV-2 target nucleic acids  are NOT DETECTED.  The SARS-CoV-2 RNA is generally detectable in upper respiratory specimens during the acute phase of infection. The lowest concentration of SARS-CoV-2 viral copies this assay can detect is 138 copies/mL. A negative result does not preclude SARS-Cov-2 infection and should not be used as the sole basis for treatment or other patient management decisions. A negative result may occur with  improper specimen collection/handling,  submission of specimen other than nasopharyngeal swab, presence of viral mutation(s) within the areas targeted by this assay, and inadequate number of viral copies(<138 copies/mL). A negative result must be combined with clinical observations, patient history, and epidemiological information. The expected result is Negative.  Fact Sheet for Patients:  EntrepreneurPulse.com.au  Fact Sheet for Healthcare Providers:  IncredibleEmployment.be  This test is no                          t yet approved or cleared by the [place] FDA and  has been authorized for detection and/or diagnosis of SARS-CoV-2 by FDA under an Emergency Use Authorization (EUA). This EUA will remain  in effect (meaning this test can be used) for the duration of the COVID-19 declaration under Section 564(b)(1) of the Act, 21 U.S.C.section 360bbb-3(b)(1), unless the authorization is terminated  or revoked sooner.       Influenza A by PCR 01/07/2021 NEGATIVE  NEGATIVE Final   Influenza B by PCR 01/07/2021 NEGATIVE  NEGATIVE Final   Comment: (NOTE) The Xpert Xpress SARS-CoV-2/FLU/RSV plus assay is intended as an aid in the diagnosis of influenza from Nasopharyngeal swab specimens and should not be used as a sole basis for treatment. Nasal washings and aspirates are unacceptable for Xpert Xpress SARS-CoV-2/FLU/RSV testing.  Fact Sheet for Patients: EntrepreneurPulse.com.au  Fact Sheet for Healthcare Providers: IncredibleEmployment.be  This test is not yet approved or cleared by the [place] FDA and has been authorized for detection and/or diagnosis of SARS-CoV-2 by FDA under an Emergency Use Authorization (EUA). This EUA will remain in effect (meaning this test can be used) for the duration of the COVID-19 declaration under Section 564(b)(1) of the Act, 21 U.S.C. section 360bbb-3(b)(1), unless the authorization is terminated  or revoked.  Performed at [facility], [place] [address], [place], [name] [address]    WBC 01/07/2021 10.5  4.0 - 10.5 K/uL Final   RBC 01/07/2021 5.19  4.22 - 5.81 MIL/uL Final   Hemoglobin 01/07/2021 15.4  13.0 - 17.0 g/dL Final   HCT 01/07/2021 47.4  39.0 - 52.0 % Final   MCV 01/07/2021 91.3  80.0 - 100.0 fL Final   MCH 01/07/2021 29.7  26.0 - 34.0 pg Final   MCHC 01/07/2021 32.5  30.0 - 36.0 g/dL Final   RDW 01/07/2021 13.1  11.5 - 15.5 % Final   Platelets 01/07/2021 193  150 - 400 K/uL Final   nRBC 01/07/2021 0.0  0.0 - 0.2 % Final   Neutrophils Relative % 01/07/2021 71  % Final   Neutro Abs 01/07/2021 7.4  1.7 - 7.7 K/uL Final   Lymphocytes Relative 01/07/2021 22  % Final   Lymphs Abs 01/07/2021 2.3  0.7 - 4.0 K/uL Final   Monocytes Relative 01/07/2021 7  % Final   Monocytes Absolute 01/07/2021 0.7  0.1 - 1.0 K/uL Final   Eosinophils Relative 01/07/2021 0  % Final   Eosinophils Absolute 01/07/2021 0.0  0.0 - 0.5 K/uL Final   Basophils Relative 01/07/2021 0  % Final  Basophils Absolute 01/07/2021 0.0  0.0 - 0.1 K/uL Final   Immature Granulocytes 01/07/2021 0  % Final   Abs Immature Granulocytes 01/07/2021 0.03  0.00 - 0.07 K/uL Final   Performed at [facility], [place] [address], [place], [name] [address]   Sodium 01/07/2021 133 (L)  135 - 145 mmol/L Final   Potassium 01/07/2021 4.1  3.5 - 5.1 mmol/L Final   Chloride 01/07/2021 98  98 - 111 mmol/L Final   CO2 01/07/2021 24  22 - 32 mmol/L Final   Glucose, Bld 01/07/2021 103 (H)  70 - 99 mg/dL Final   Glucose reference range applies only to samples taken after fasting for at least 8 hours.   BUN 01/07/2021 19  6 - 20 mg/dL Final   Creatinine, Ser 01/07/2021 1.26 (H)  0.61 - 1.24 mg/dL Final   Calcium 01/07/2021 9.9  8.9 - 10.3 mg/dL Final   Total Protein 01/07/2021 8.4 (H)  6.5 - 8.1 g/dL Final   Albumin 01/07/2021 4.8  3.5 - 5.0 g/dL Final   AST 01/07/2021 102 (H)  15 - 41 U/L Final   ALT 01/07/2021 57 (H)  0  - 44 U/L Final   Alkaline Phosphatase 01/07/2021 129 (H)  38 - 126 U/L Final   Total Bilirubin 01/07/2021 0.8  0.3 - 1.2 mg/dL Final   GFR, Estimated 01/07/2021 >60  >60 mL/min Final   Comment: (NOTE) Calculated using the CKD-EPI Creatinine Equation (2021)    Anion gap 01/07/2021 11  5 - 15 Final   Performed at [facility] [address], [place], [place] [address]   Alcohol, Ethyl (B) 01/07/2021 <10  <10 mg/dL Final   Comment: (NOTE) Lowest detectable limit for serum alcohol is 10 mg/dL.  For medical purposes only. Performed at [facility], [place] [address] [place], [place] [address]    Cholesterol 01/07/2021 266 (H)  0 - 200 mg/dL Final   Triglycerides 01/07/2021 81  <150 mg/dL Final   HDL 01/07/2021 59  >40 mg/dL Final   Total CHOL/HDL Ratio 01/07/2021 4.5  RATIO Final   VLDL 01/07/2021 16  0 - 40 mg/dL Final   LDL Cholesterol 01/07/2021 191 (H)  0 - 99 mg/dL Final   Comment:        Total Cholesterol/HDL:CHD Risk Coronary Heart Disease Risk Table                     Men   Women  1/2 Average Risk   3.4   3.3  Average Risk       5.0   4.4  2 X Average Risk   9.6   7.1  3 X Average Risk  23.4   11.0        Use the calculated Patient Ratio above and the CHD Risk Table to determine the patient's CHD Risk.        ATP III CLASSIFICATION (LDL):  <100     mg/dL   Optimal  100-129  mg/dL   Near or Above                    Optimal  130-159  mg/dL   Borderline  160-189  mg/dL   High  >190     mg/dL   Very High Performed at [facility] [address], [place], [place] [address]    POC Amphetamine UR 01/07/2021 None Detected  NONE DETECTED (Cut Off Level 1000  ng/mL) Final   POC Secobarbital (BAR) 01/07/2021 None Detected  NONE DETECTED (Cut Off Level 300 ng/mL) Final   POC Buprenorphine (BUP) 01/07/2021 None Detected  NONE DETECTED (Cut Off Level 10 ng/mL) Final   POC Oxazepam (BZO) 01/07/2021 None Detected  NONE DETECTED (Cut Off Level 300 ng/mL) Final    POC Cocaine UR 01/07/2021 Positive (A)  NONE DETECTED (Cut Off Level 300 ng/mL) Final   POC Methamphetamine UR 01/07/2021 None Detected  NONE DETECTED (Cut Off Level 1000 ng/mL) Final   POC Morphine 01/07/2021 None Detected  NONE DETECTED (Cut Off Level 300 ng/mL) Final   POC Oxycodone UR 01/07/2021 None Detected  NONE DETECTED (Cut Off Level 100 ng/mL) Final   POC Methadone UR 01/07/2021 None Detected  NONE DETECTED (Cut Off Level 300 ng/mL) Final   POC Marijuana UR 01/07/2021 None Detected  NONE DETECTED (Cut Off Level 50 ng/mL) Final   SARSCOV2ONAVIRUS 2 AG 01/07/2021 NEGATIVE  NEGATIVE Final   Comment: (NOTE) SARS-CoV-2 antigen NOT DETECTED.   Negative results are presumptive.  Negative results do not preclude SARS-CoV-2 infection and should not be used as the sole basis for treatment or other patient management decisions, including infection  control decisions, particularly in the presence of clinical signs and  symptoms consistent with COVID-19, or in those who have been in contact with the virus.  Negative results must be combined with clinical observations, patient history, and epidemiological information. The expected result is Negative.  Fact Sheet for Patients: HandmadeRecipes.com.cy  Fact Sheet for Healthcare Providers: FuneralLife.at  This test is not yet approved or cleared by the [place] FDA and  has been authorized for detection and/or diagnosis of SARS-CoV-2 by FDA under an Emergency Use Authorization (EUA).  This EUA will remain in effect (meaning this test can be used) for the duration of  the COV                          ID-19 declaration under Section 564(b)(1) of the Act, 21 U.S.C. section 360bbb-3(b)(1), unless the authorization is terminated or revoked sooner.     SARS Coronavirus 2 Ag 01/07/2021 Negative  Negative Preliminary  Appointment on 01/06/2021  Component Date Value Ref Range Status   CD4 T Cell  Abs 01/06/2021 910  400 - 1,790 /uL Final   CD4 % Helper T Cell 01/06/2021 36  33 - 65 % Final   Performed at [facility], [place] [address], [place], [place] [address]  Admission on 01/02/2021, Discharged on 01/03/2021  Component Date Value Ref Range Status   Lipase 01/03/2021 44  11 - 51 U/L Final   Performed at [facility], [place] [address], [place], [name] [address]   Sodium 01/03/2021 137  135 - 145 mmol/L Final   Potassium 01/03/2021 4.2  3.5 - 5.1 mmol/L Final   Chloride 01/03/2021 101  98 - 111 mmol/L Final   CO2 01/03/2021 30  22 - 32 mmol/L Final   Glucose, Bld 01/03/2021 123 (H)  70 - 99 mg/dL Final   Glucose reference range applies only to samples taken after fasting for at least 8 hours.   BUN 01/03/2021 12  6 - 20 mg/dL Final   Creatinine, Ser 01/03/2021 1.12  0.61 - 1.24 mg/dL Final   Calcium 01/03/2021 10.0  8.9 - 10.3 mg/dL Final   Total Protein 01/03/2021 7.6  6.5 - 8.1 g/dL Final   Albumin 01/03/2021 4.1  3.5 - 5.0  g/dL Final   AST 01/03/2021 27  15 - 41 U/L Final   ALT 01/03/2021 41  0 - 44 U/L Final   Alkaline Phosphatase 01/03/2021 110  38 - 126 U/L Final   Total Bilirubin 01/03/2021 0.5  0.3 - 1.2 mg/dL Final   GFR, Estimated 01/03/2021 >60  >60 mL/min Final   Comment: (NOTE) Calculated using the CKD-EPI Creatinine Equation (2021)    Anion gap 01/03/2021 6  5 - 15 Final   Performed at [facility], [place] [address], [place], [name] [address]   WBC 01/03/2021 10.4  4.0 - 10.5 K/uL Final   RBC 01/03/2021 5.04  4.22 - 5.81 MIL/uL Final   Hemoglobin 01/03/2021 15.4  13.0 - 17.0 g/dL Final   HCT 01/03/2021 46.0  39.0 - 52.0 % Final   MCV 01/03/2021 91.3  80.0 - 100.0 fL Final   MCH 01/03/2021 30.6  26.0 - 34.0 pg Final   MCHC 01/03/2021 33.5  30.0 - 36.0 g/dL Final   RDW 01/03/2021 12.8  11.5 - 15.5 % Final   Platelets 01/03/2021 231  150 - 400 K/uL Final   nRBC 01/03/2021 0.0  0.0 - 0.2 % Final   Performed at [facility] [address], [place], [name] [address]   Color, Urine 01/03/2021 YELLOW  YELLOW Final   APPearance 01/03/2021 HAZY (A)  CLEAR Final   Specific Gravity, Urine 01/03/2021 1.015  1.005 - 1.030 Final   pH 01/03/2021 8.0  5.0 - 8.0 Final   Glucose, UA 01/03/2021 NEGATIVE  NEGATIVE mg/dL Final   Hgb urine dipstick 01/03/2021 NEGATIVE  NEGATIVE Final   Bilirubin Urine 01/03/2021 NEGATIVE  NEGATIVE Final   Ketones, ur 01/03/2021 NEGATIVE  NEGATIVE mg/dL Final   Protein, ur 01/03/2021 NEGATIVE  NEGATIVE mg/dL Final   Nitrite 01/03/2021 NEGATIVE  NEGATIVE Final   Leukocytes,Ua 01/03/2021 NEGATIVE  NEGATIVE Final   Comment: Microscopic not done on urines with negative protein, blood, leukocytes, nitrite, or glucose < 500 mg/dL. Performed at [facility], [place] [address] [place], [place] [address]   Admission on 10/25/2020, Discharged on 10/31/2020  Component Date Value Ref Range Status   SARS Coronavirus 2 by RT PCR 10/26/2020 NEGATIVE  NEGATIVE Final   Comment: (NOTE) SARS-CoV-2 target nucleic acids are NOT DETECTED.  The SARS-CoV-2 RNA is generally detectable in upper respiratory specimens during the acute phase of infection. The lowest concentration of SARS-CoV-2 viral copies this assay can detect is 138 copies/mL. A negative result does not preclude SARS-Cov-2 infection and should not be used as the sole basis for treatment or other patient management decisions. A negative result may occur with  improper specimen collection/handling, submission of specimen other than nasopharyngeal swab, presence of viral mutation(s) within the areas targeted by this assay, and inadequate number of viral copies(<138 copies/mL). A negative result must be combined with clinical observations, patient history, and epidemiological information. The expected result is Negative.  Fact Sheet for Patients:  EntrepreneurPulse.com.au  Fact Sheet for Healthcare Providers:   IncredibleEmployment.be  This test is no                          t yet approved or cleared by the [place] FDA and  has been authorized for detection and/or diagnosis of SARS-CoV-2 by FDA under an Emergency Use Authorization (EUA). This EUA will remain  in effect (meaning this test can be used) for the duration of the  COVID-19 declaration under Section 564(b)(1) of the Act, 21 U.S.C.section 360bbb-3(b)(1), unless the authorization is terminated  or revoked sooner.       Influenza A by PCR 10/26/2020 NEGATIVE  NEGATIVE Final   Influenza B by PCR 10/26/2020 NEGATIVE  NEGATIVE Final   Comment: (NOTE) The Xpert Xpress SARS-CoV-2/FLU/RSV plus assay is intended as an aid in the diagnosis of influenza from Nasopharyngeal swab specimens and should not be used as a sole basis for treatment. Nasal washings and aspirates are unacceptable for Xpert Xpress SARS-CoV-2/FLU/RSV testing.  Fact Sheet for Patients: EntrepreneurPulse.com.au  Fact Sheet for Healthcare Providers: IncredibleEmployment.be  This test is not yet approved or cleared by the [place] FDA and has been authorized for detection and/or diagnosis of SARS-CoV-2 by FDA under an Emergency Use Authorization (EUA). This EUA will remain in effect (meaning this test can be used) for the duration of the COVID-19 declaration under Section 564(b)(1) of the Act, 21 U.S.C. section 360bbb-3(b)(1), unless the authorization is terminated or revoked.  Performed at [facility], [place] [address], [place], [name] [address]    SARS Coronavirus 2 Ag 10/26/2020 Negative  Negative Preliminary   WBC 10/26/2020 7.7  4.0 - 10.5 K/uL Final   RBC 10/26/2020 4.58  4.22 - 5.81 MIL/uL Final   Hemoglobin 10/26/2020 14.1  13.0 - 17.0 g/dL Final   HCT 10/26/2020 41.4  39.0 - 52.0 % Final   MCV 10/26/2020 90.4  80.0 - 100.0 fL Final   MCH 10/26/2020 30.8  26.0 - 34.0 pg Final   MCHC  10/26/2020 34.1  30.0 - 36.0 g/dL Final   RDW 10/26/2020 12.6  11.5 - 15.5 % Final   Platelets 10/26/2020 218  150 - 400 K/uL Final   nRBC 10/26/2020 0.0  0.0 - 0.2 % Final   Neutrophils Relative % 10/26/2020 58  % Final   Neutro Abs 10/26/2020 4.4  1.7 - 7.7 K/uL Final   Lymphocytes Relative 10/26/2020 36  % Final   Lymphs Abs 10/26/2020 2.8  0.7 - 4.0 K/uL Final   Monocytes Relative 10/26/2020 5  % Final   Monocytes Absolute 10/26/2020 0.4  0.1 - 1.0 K/uL Final   Eosinophils Relative 10/26/2020 1  % Final   Eosinophils Absolute 10/26/2020 0.1  0.0 - 0.5 K/uL Final   Basophils Relative 10/26/2020 0  % Final   Basophils Absolute 10/26/2020 0.0  0.0 - 0.1 K/uL Final   Immature Granulocytes 10/26/2020 0  % Final   Abs Immature Granulocytes 10/26/2020 0.00  0.00 - 0.07 K/uL Final   Performed at [facility], [place] [address] [place], [name] [address]   Sodium 10/26/2020 136  135 - 145 mmol/L Final   Potassium 10/26/2020 3.8  3.5 - 5.1 mmol/L Final   Chloride 10/26/2020 105  98 - 111 mmol/L Final   CO2 10/26/2020 21 (L)  22 - 32 mmol/L Final   Glucose, Bld 10/26/2020 151 (H)  70 - 99 mg/dL Final   Glucose reference range applies only to samples taken after fasting for at least 8 hours.   BUN 10/26/2020 15  6 - 20 mg/dL Final   Creatinine, Ser 10/26/2020 1.16  0.61 - 1.24 mg/dL Final   Calcium 10/26/2020 9.7  8.9 - 10.3 mg/dL Final   Total Protein 10/26/2020 6.9  6.5 - 8.1 g/dL Final   Albumin 10/26/2020 4.1  3.5 - 5.0 g/dL Final   AST 10/26/2020 34  15 - 41 U/L Final   ALT 10/26/2020  38  0 - 44 U/L Final   Alkaline Phosphatase 10/26/2020 91  38 - 126 U/L Final   Total Bilirubin 10/26/2020 0.7  0.3 - 1.2 mg/dL Final   GFR, Estimated 10/26/2020 >60  >60 mL/min Final   Comment: (NOTE) Calculated using the CKD-EPI Creatinine Equation (2021)    Anion gap 10/26/2020 10  5 - 15 Final   Performed at [facility] [address], [place], [name] [address]   Hgb A1c MFr Bld  10/26/2020 6.0 (H)  4.8 - 5.6 % Final   Comment: (NOTE) Pre diabetes:          5.7%-6.4%  Diabetes:              >6.4%  Glycemic control for   <7.0% adults with diabetes    Mean Plasma Glucose 10/26/2020 125.5  mg/dL Final   Performed at [facility], [place] [address] [place], [place] [address]   Alcohol, Ethyl (B) 10/26/2020 <10  <10 mg/dL Final   Comment: (NOTE) Lowest detectable limit for serum alcohol is 10 mg/dL.  For medical purposes only. Performed at [facility], [place] [address] [place], [place] [address]    Cholesterol 10/26/2020 153  0 - 200 mg/dL Final   Triglycerides 10/26/2020 80  <150 mg/dL Final   HDL 10/26/2020 43  >40 mg/dL Final   Total CHOL/HDL Ratio 10/26/2020 3.6  RATIO Final   VLDL 10/26/2020 16  0 - 40 mg/dL Final   LDL Cholesterol 10/26/2020 94  0 - 99 mg/dL Final   Comment:        Total Cholesterol/HDL:CHD Risk Coronary Heart Disease Risk Table                     Men   Women  1/2 Average Risk   3.4   3.3  Average Risk       5.0   4.4  2 X Average Risk   9.6   7.1  3 X Average Risk  23.4   11.0        Use the calculated Patient Ratio above and the CHD Risk Table to determine the patient's CHD Risk.        ATP III CLASSIFICATION (LDL):  <100     mg/dL   Optimal  100-129  mg/dL   Near or Above                    Optimal  130-159  mg/dL   Borderline  160-189  mg/dL   High  >190     mg/dL   Very High Performed at [facility] [address], [place], [place] [address]    TSH 10/26/2020 0.512  0.350 - 4.500 uIU/mL Final   Comment: Performed by a 3rd Generation assay with a functional sensitivity of <=0.01 uIU/mL. Performed at [facility], [place] [address], [place], [name] [address]    POC Amphetamine UR 10/26/2020 None Detected  NONE DETECTED (Cut Off Level 1000 ng/mL) Final   POC Secobarbital (BAR) 10/26/2020 None Detected  NONE DETECTED (Cut Off Level 300 ng/mL) Final   POC Buprenorphine (BUP) 10/26/2020 None Detected   NONE DETECTED (Cut Off Level 10 ng/mL) Final   POC Oxazepam (BZO) 10/26/2020 Positive (A)  NONE DETECTED (Cut Off Level 300 ng/mL) Final   POC Cocaine UR 10/26/2020 Positive (A)  NONE DETECTED (Cut Off Level 300 ng/mL) Final   POC Methamphetamine UR 10/26/2020  None Detected  NONE DETECTED (Cut Off Level 1000 ng/mL) Final   POC Morphine 10/26/2020 None Detected  NONE DETECTED (Cut Off Level 300 ng/mL) Final   POC Oxycodone UR 10/26/2020 None Detected  NONE DETECTED (Cut Off Level 100 ng/mL) Final   POC Methadone UR 10/26/2020 None Detected  NONE DETECTED (Cut Off Level 300 ng/mL) Final   POC Marijuana UR 10/26/2020 None Detected  NONE DETECTED (Cut Off Level 50 ng/mL) Final   Group A Strep by PCR 10/30/2020 NOT DETECTED  NOT DETECTED Final   Performed at [facility] [address] [place], [place] [address]  Admission on 08/24/2020, Discharged on 08/24/2020  Component Date Value Ref Range Status   WBC 08/24/2020 11.5 (H)  4.0 - 10.5 K/uL Final   RBC 08/24/2020 4.93  4.22 - 5.81 MIL/uL Final   Hemoglobin 08/24/2020 15.0  13.0 - 17.0 g/dL Final   HCT 08/24/2020 46.0  39.0 - 52.0 % Final   MCV 08/24/2020 93.3  80.0 - 100.0 fL Final   MCH 08/24/2020 30.4  26.0 - 34.0 pg Final   MCHC 08/24/2020 32.6  30.0 - 36.0 g/dL Final   RDW 08/24/2020 13.0  11.5 - 15.5 % Final   Platelets 08/24/2020 217  150 - 400 K/uL Final   nRBC 08/24/2020 0.0  0.0 - 0.2 % Final   Neutrophils Relative % 08/24/2020 73  % Final   Neutro Abs 08/24/2020 8.3 (H)  1.7 - 7.7 K/uL Final   Lymphocytes Relative 08/24/2020 20  % Final   Lymphs Abs 08/24/2020 2.3  0.7 - 4.0 K/uL Final   Monocytes Relative 08/24/2020 7  % Final   Monocytes Absolute 08/24/2020 0.8  0.1 - 1.0 K/uL Final   Eosinophils Relative 08/24/2020 0  % Final   Eosinophils Absolute 08/24/2020 0.0  0.0 - 0.5 K/uL Final   Basophils Relative 08/24/2020 0  % Final   Basophils Absolute 08/24/2020 0.0  0.0 - 0.1 K/uL Final   Immature Granulocytes  08/24/2020 0  % Final   Abs Immature Granulocytes 08/24/2020 0.04  0.00 - 0.07 K/uL Final   Performed at [facility], [place] [address] [place], [name] [address]   Sodium 08/24/2020 138  135 - 145 mmol/L Final   Potassium 08/24/2020 4.3  3.5 - 5.1 mmol/L Final   Chloride 08/24/2020 103  98 - 111 mmol/L Final   CO2 08/24/2020 26  22 - 32 mmol/L Final   Glucose, Bld 08/24/2020 88  70 - 99 mg/dL Final   Glucose reference range applies only to samples taken after fasting for at least 8 hours.   BUN 08/24/2020 15  6 - 20 mg/dL Final   Creatinine, Ser 08/24/2020 1.21  0.61 - 1.24 mg/dL Final   Calcium 08/24/2020 9.8  8.9 - 10.3 mg/dL Final   Total Protein 08/24/2020 8.7 (H)  6.5 - 8.1 g/dL Final   Albumin 08/24/2020 4.8  3.5 - 5.0 g/dL Final   AST 08/24/2020 49 (H)  15 - 41 U/L Final   ALT 08/24/2020 47 (H)  0 - 44 U/L Final   Alkaline Phosphatase 08/24/2020 90  38 - 126 U/L Final   Total Bilirubin 08/24/2020 0.7  0.3 - 1.2 mg/dL Final   GFR, Estimated 08/24/2020 >60  >60 mL/min Final   Comment: (NOTE) Calculated using the CKD-EPI Creatinine Equation (2021)    Anion gap 08/24/2020 9  5 - 15 Final   Performed at [facility], [address] [place], [place] [address]   Alcohol, Ethyl (B) 08/24/2020 <10  <10 mg/dL Final   Comment: (NOTE) Lowest detectable limit for serum alcohol is 10 mg/dL.  For medical purposes only. Performed at [facility], [place] [address], [place], [name] [address]    Acetaminophen (Tylenol), Serum 08/24/2020 <10 (L)  10 - 30 ug/mL Final   Comment: (NOTE) Therapeutic concentrations vary significantly. A range of 10-30 ug/mL  may be an effective concentration for many patients. However, some  are best treated at concentrations outside of this range. Acetaminophen concentrations >150 ug/mL at 4 hours after ingestion  and >50 ug/mL at 12 hours after ingestion are often associated with  toxic  reactions.  Performed at [facility], [place] [address], [place], [name] [address]    Troponin I (High Sensitivity) 08/24/2020 5  <18 ng/L Final   Comment: (NOTE) Elevated high sensitivity troponin I (hsTnI) values and significant  changes across serial measurements may suggest ACS but many other  chronic and acute conditions are known to elevate hsTnI results.  Refer to the "Links" section for chest pain algorithms and additional  guidance. Performed at [facility], [place] [address], [place],  [address]    SARS Coronavirus 2 by RT PCR 08/24/2020 NEGATIVE  NEGATIVE Final   Comment: (NOTE) SARS-CoV-2 target nucleic acids are NOT DETECTED.  The SARS-CoV-2 RNA is generally detectable in upper respiratory specimens during the acute phase of infection. The lowest concentration of SARS-CoV-2 viral copies this assay can detect is 138 copies/mL. A negative result does not preclude SARS-Cov-2 infection and should not be used as the sole basis for treatment or other patient management decisions. A negative result may occur with  improper specimen collection/handling, submission of specimen other than nasopharyngeal swab, presence of viral mutation(s) within the areas targeted by this assay, and inadequate number of viral copies(<138 copies/mL). A negative result must be combined with clinical observations, patient history, and epidemiological information. The expected result is Negative.  Fact Sheet for Patients:  EntrepreneurPulse.com.au  Fact Sheet for Healthcare Providers:  IncredibleEmployment.be  This test is no                          t yet approved or cleared by the [place] FDA and  has been authorized for detection and/or diagnosis of SARS-CoV-2 by FDA under an Emergency Use Authorization (EUA). This EUA will remain  in effect (meaning this test can be used) for the duration of the COVID-19  declaration under Section 564(b)(1) of the Act, 21 U.S.C.section 360bbb-3(b)(1), unless the authorization is terminated  or revoked sooner.       Influenza A by PCR 08/24/2020 NEGATIVE  NEGATIVE Final   Influenza B by PCR 08/24/2020 NEGATIVE  NEGATIVE Final   Comment: (NOTE) The Xpert Xpress SARS-CoV-2/FLU/RSV plus assay is intended as an aid in the diagnosis of influenza from Nasopharyngeal swab specimens and should not be used as a sole basis for treatment. Nasal washings and aspirates are unacceptable for Xpert Xpress SARS-CoV-2/FLU/RSV testing.  Fact Sheet for Patients: EntrepreneurPulse.com.au  Fact Sheet for Healthcare Providers: IncredibleEmployment.be  This test is not yet approved or cleared by the [place] FDA and has been authorized for detection and/or diagnosis of SARS-CoV-2 by FDA under an Emergency Use Authorization (EUA). This EUA will remain in effect (meaning this test can be used) for the duration of the COVID-19 declaration under Section 564(b)(1) of the Act, 21  U.S.C. section 360bbb-3(b)(1), unless the authorization is terminated or revoked.  Performed at [facility], [place] [address] [place], [place] [address]   Office Visit on 07/14/2020  Component Date Value Ref Range Status   Vitamin B-12 07/14/2020 403  200 - 1,100 pg/mL Final   Sed Rate 07/14/2020 2  0 - 20 mm/h Final   T3 Uptake 07/14/2020 29  22 - 35 % Final   T4, Total 07/14/2020 6.4  4.9 - 10.5 mcg/dL Final   Free Thyroxine Index 07/14/2020 1.9  1.4 - 3.8 Final   TSH 07/14/2020 1.59  0.40 - 4.50 mIU/L Final    Blood Alcohol level:  Lab Results  Component Value Date   ETH <10 01/07/2021   ETH <10 15/05/6977    Metabolic Disorder Labs: Lab Results  Component Value Date   HGBA1C 6.0 (H) 10/26/2020   MPG 125.5 10/26/2020   MPG 131.24 04/28/2020   Lab Results  Component Value Date   PROLACTIN 12.1 03/08/2020   PROLACTIN 2.6  (L) 11/09/2019   Lab Results  Component Value Date   CHOL 266 (H) 01/07/2021   TRIG 81 01/07/2021   HDL 59 01/07/2021   CHOLHDL 4.5 01/07/2021   VLDL 16 01/07/2021   LDLCALC 191 (H) 01/07/2021   LDLCALC 94 10/26/2020    Therapeutic Lab Levels: No results found for: LITHIUM No results found for: VALPROATE No components found for:  CBMZ  Physical Findings   AIMS    Flowsheet Row Admission (Discharged) from 04/28/2020 in [facility] Admission (Discharged) from 03/07/2020 in [facility]  AIMS Total Score 0 0      AUDIT    Flowsheet Row Admission (Discharged) from 04/28/2020 in [facility] Admission (Discharged) from 03/07/2020 in [facility] Admission (Discharged) from 12/12/2011 in [facility]  Alcohol Use Disorder Identification Test Final Score (AUDIT) 3 13 8       GAD-7    Flowsheet Row Office Visit from 03/17/2020 in [facility] Office Visit from 07/22/2019 in [facility] Office Visit from 09/03/2018 in [facility]  Total GAD-7 Score 16 19 19       PHQ2-9    [place] [facility] ED from 10/25/2020 in [facility] Video Visit from 04/05/2020 in [facility] Office Visit from 03/17/2020 in [place] ED from 11/09/2019 in [facility] Office Visit from 07/22/2019 in [place]  PHQ-2 Total Score 5 0 1 6 4   PHQ-9 Total Score 15 -- 9 19 15       Flowsheet Row ED from 01/07/2021 in [facility] ED from 01/02/2021 in [facility] ED from 10/25/2020 in [facility] High Risk No Risk Low Risk         Musculoskeletal  Strength & Muscle Tone: within normal limits Gait & Station:  sitting in bed with good axial tone Patient leans: N/A  Psychiatric Specialty Exam  Presentation  General Appearance: Appropriate for Environment; Disheveled  Eye Contact:Good  Speech:Clear and Coherent  Speech Volume:Normal  Handedness:Right   Mood and Affect  Mood:Depressed  Affect:Congruent   Thought Process  Thought Processes:Coherent  Descriptions of Associations:Intact  Orientation:Full (Time, Place and Person)  Thought Content:-- (significant  for suicidal ideation without plan)  Diagnosis of Schizophrenia or Schizoaffective disorder in past: Yes  Duration of Psychotic Symptoms: Greater than six months   Hallucinations:Hallucinations: -- (last reported VH of last night) Description of Auditory Hallucinations: refused to discuss content of VH Description of Visual Hallucinations: sees a person trying to climb into his apartment through a window on the 4th floor on his building  Ideas of Reference:None  Suicidal Thoughts:Suicidal Thoughts: Yes, Passive SI Active Intent and/or Plan: With Plan SI Passive Intent and/or Plan: Without Means to Carry Out  Homicidal Thoughts:Homicidal Thoughts: No   Sensorium  Memory:Immediate Good; Recent Good; Remote Fair  Judgment:Poor  Insight:Good   Executive Functions  Concentration:Poor (fell asleep multiple times in short interview)  Attention Span:Poor (fell asleep multiple times in short interview)  [place]  Language:Good   Psychomotor Activity  Psychomotor Activity:Psychomotor Activity: Normal   Assets  Assets:Communication Skills; Desire for Improvement; Housing   Sleep  Sleep:Sleep: Good Number of Hours of Sleep: 0 (many)   Nutritional Assessment (For OBS and FBC admissions only) Has the patient had a weight loss or gain of 10 pounds or more in the last 3 months?: Yes Has the  patient had a decrease in food intake/or appetite?: Yes Does the patient have dental problems?: No Does the patient have eating habits or behaviors that may be indicators of an eating disorder including binging or inducing vomiting?: No Has the patient recently lost weight without trying?: 1 Has the patient been eating poorly because of a decreased appetite?: 0 Malnutrition Screening Tool Score: 1    Physical Exam  Physical Exam Constitutional:      Comments: Sedated   HENT:     Head: Normocephalic.  Pulmonary:     Effort: Pulmonary effort is normal.  Neurological:     Mental Status: He is oriented to person, place, and time.   ROS Blood pressure 102/66, pulse 84, temperature 97.7 F (36.5 C), temperature source Temporal, resp. rate 18, SpO2 99 %. There is no height or weight on file to calculate BMI.  Treatment Plan Summary: Daily contact with patient to assess and evaluate symptoms and progress in treatment and Medication management Needs diagnostic interview at some point when can better tolerate it  Restart medications to include:  bictegravir-emtricitabine-tenofovir AF  1 tablet Oral Daily   DULoxetine  60 mg Oral Daily   gabapentin  600 mg Oral q AM   gabapentin  900 mg Oral QHS   nicotine  21 mg Transdermal Once   QUEtiapine  250 mg Oral q1800   rosuvastatin  20 mg Oral Daily   traZODone  150 mg Oral QHS     Karsen Fellows A [place] Finder 01/08/2021 11:51 AM

## 2021-01-09 DIAGNOSIS — R45851 Suicidal ideations: Secondary | ICD-10-CM | POA: Diagnosis not present

## 2021-01-09 DIAGNOSIS — F251 Schizoaffective disorder, depressive type: Secondary | ICD-10-CM | POA: Diagnosis not present

## 2021-01-09 DIAGNOSIS — Z20822 Contact with and (suspected) exposure to covid-19: Secondary | ICD-10-CM | POA: Diagnosis not present

## 2021-01-09 LAB — HIV-1 RNA QUANT-NO REFLEX-BLD
HIV 1 RNA Quant: <20 Copies/mL — ABNORMAL HIGH
HIV-1 RNA Quant, Log: <1.3 Log cps/mL — ABNORMAL HIGH

## 2021-01-09 MED ORDER — DULOXETINE HCL 30 MG PO CPEP
90.0000 mg | ORAL_CAPSULE | Freq: Every day | ORAL | Status: DC
  Administered 2021-01-10 – 2021-01-11 (×2): 90 mg via ORAL
  Filled 2021-01-09 (×2): qty 3
  Filled 2021-01-09: qty 42

## 2021-01-09 NOTE — ED Provider Notes (Signed)
Behavioral Health Progress Note  Date and Time: 01/09/2021 12:39 PM Name: Dylan [name] MRN:  [id number]  Subjective:  Pt is seen in late AM. He is much more willing to engage in interview. He reports that he is doing "so-so" and is lying in bed thinking about "stuff" (yesterday had been oversedated). He is mainly thinking about his future and discusses a desire to move to [place] to be in a smaller community, away from his dealer, and [place]. He has no family or support system there but has a sister who vacations there frequently. He is currently ambivalent to suicide, weighing above plan against ongoing passive suicidal thoughts. He denies HI. Had a hallucination last night as he was going to sleep; has not had hallucinations in the day for 2-3 days now. He was amenable to inc of cymbalta after discussion of r/b/se and education on dual use of this medication as tx for both neuropathy and depression. Having some nicotine cravings but wants to wait out current dose of patch. Main other concerns today are talking to SW to figure out how to evict his dealer from  his apartment (has been staying around 30 days) and getting eventual f/u with Dr. Sharol Given for surgery on his foot. Appetite is good. Complained of poor sleep but spent most of yesterday sleeping.   Diagnosis:  Final diagnoses:  Schizoaffective disorder, depressive type ([place])  Suicidal ideation    Total Time spent with patient: 20 minutes  Past Psychiatric History: Schizoaffective disorder vs substance induced mood disorder.  Past Medical History:  Past Medical History:  Diagnosis Date   Anxiety    Arthritis    Bipolar 1 disorder ([place])    Colon polyps    Depression    GERD (gastroesophageal reflux disease)    Hepatitis B    Human immunodeficiency virus (HIV) ([place])    Hyperlipidemia    Hypertension    Neuromuscular disorder ([place])    neuropathy   Neuropathy    Pre-diabetes    Schizophrenia ([place])     Past  Surgical History:  Procedure Laterality Date   FOOT ARTHRODESIS Right 09/17/2019   Procedure: FUSION RIGHT LISFRANC JOINT;  Surgeon: Newt Minion, MD;  Location: [facility];  Service: Orthopedics;  Laterality: Right;   FOOT ARTHRODESIS Right 09/2019   HEMORROIDECTOMY     IR FLUORO GUIDE CV LINE RIGHT  02/22/2017   IR US GUIDE VASC ACCESS RIGHT  02/22/2017   TUMOR REMOVAL     From Chest   Family History:  Family History  Problem Relation Age of Onset   CAD Mother    Diabetes Father    Colon cancer Maternal Aunt 60   Diabetes Maternal Aunt    Heart disease Maternal Uncle    Esophageal cancer Neg Hx    Rectal cancer Neg Hx    Stomach cancer Neg Hx    Family Psychiatric  History: Unknown Social History:  Social History   Substance and Sexual Activity  Alcohol Use Yes   Comment: occassional use on weekends     Social History   Substance and Sexual Activity  Drug Use Yes   Types: Cocaine, Marijuana   Comment: last use was Friday 03/05/2020    Social History   Socioeconomic History   Marital status: Married    Spouse name: Not on file   Number of children: 1   Years of education: Not on file   Highest education level: Not on file  Occupational  History   Not on file  Tobacco Use   Smoking status: Every Day    Packs/day: 0.50    Years: 15.00    Pack years: 7.50    Types: E-cigarettes, Cigarettes   Smokeless tobacco: Never   Tobacco comments:    has patches when he is ready to quit  Vaping Use   Vaping Use: Some days   Substances: Nicotine  Substance and Sexual Activity   Alcohol use: Yes    Comment: occassional use on weekends   Drug use: Yes    Types: Cocaine, Marijuana    Comment: last use was Friday 03/05/2020   Sexual activity: Yes    Partners: Female    Birth control/protection: Condom    Comment: condoms given  Other Topics Concern   Not on file  Social History Narrative   Not on file   Social Determinants of Health   Financial Resource Strain: Not on  file  Food Insecurity: Not on file  Transportation Needs: Not on file  Physical Activity: Not on file  Stress: Not on file  Social Connections: Not on file   SDOH:  SDOH Screenings   Alcohol Screen: Low Risk    Last Alcohol Screening Score (AUDIT): 3  Depression (PHQ2-9): Medium Risk   PHQ-2 Score: 15  Financial Resource Strain: Not on file  Food Insecurity: Not on file  Housing: Not on file  Physical Activity: Not on file  Social Connections: Not on file  Stress: Not on file  Tobacco Use: High Risk   Smoking Tobacco Use: Every Day   Smokeless Tobacco Use: Never   Passive Exposure: Not on file  Transportation Needs: Not on file   Additional Social History:    Pain Medications: See MAR Prescriptions: See MAR Over the Counter: See MAR History of alcohol / drug use?: Yes Longest period of sobriety (when/how long): 2 years Negative Consequences of Use: Financial, Legal Withdrawal Symptoms: None Name of Substance 1: Alcohol 1 - Age of First Use: Adolescent 1 - Amount (size/oz): 1-2 beers 1 - Frequency: 1-2 times per week 1 - Duration: Ongoing 1 - Last Use / Amount: 12/28/2020, 2 cans of beer 1 - Method of Aquiring: Store 1- Route of Use: Oral ingestion Name of Substance 2: Marijuana 2 - Age of First Use: 16 2 - Amount (size/oz): 1-2 joints 2 - Frequency: 4-5 times per month 2 - Duration: Ongoing 2 - Last Use / Amount: 3 days ago 2 - Method of Aquiring: unknown 2 - Route of Substance Use: Smoking Name of Substance 3: Cocaine 3 - Age of First Use: unknown 3 - Amount (size/oz): Varies 3 - Frequency: Approximately once per month 3 - Duration: Ongoing 3 - Last Use / Amount: 3 days ago 3 - Method of Aquiring: unknown 3 - Route of Substance Use: Inhaling              Sleep: Poor  Appetite:  Good  Current Medications:  Current Facility-Administered Medications  Medication Dose Route Frequency Provider Last Rate Last Admin   acetaminophen (TYLENOL) tablet  650 mg  650 mg Oral Q6H PRN Ajibola, Ene A, NP       alum & mag hydroxide-simeth (MAALOX/MYLANTA) 200-200-20 MG/5ML suspension 30 mL  30 mL Oral Q4H PRN Ajibola, Ene A, NP       bictegravir-emtricitabine-tenofovir AF (BIKTARVY) 50-200-25 MG per tablet 1 tablet  1 tablet Oral Daily Ajibola, Ene A, NP   1 tablet at 01/09/21 0940   [START  ON 01/10/2021] DULoxetine (CYMBALTA) DR capsule 90 mg  90 mg Oral Daily Daelen Belvedere A       gabapentin (NEURONTIN) capsule 600 mg  600 mg Oral q AM Ajibola, Ene A, NP   600 mg at 01/09/21 0940   gabapentin (NEURONTIN) capsule 900 mg  900 mg Oral QHS Ajibola, Ene A, NP   900 mg at 01/07/21 2224   hydrOXYzine (ATARAX) tablet 25 mg  25 mg Oral TID PRN Ajibola, Ene A, NP       magnesium hydroxide (MILK OF MAGNESIA) suspension 30 mL  30 mL Oral Daily PRN Ajibola, Ene A, NP       nicotine polacrilex (NICORETTE) gum 2 mg  2 mg Oral TID PRN Jodeen Mclin A   2 mg at 01/08/21 1227   QUEtiapine (SEROQUEL) tablet 250 mg  250 mg Oral q1800 Ramesses Crampton A   250 mg at 01/08/21 1723   rosuvastatin (CRESTOR) tablet 20 mg  20 mg Oral Daily Ajibola, Ene A, NP   20 mg at 01/09/21 0940   traZODone (DESYREL) tablet 150 mg  150 mg Oral QHS Ajibola, Ene A, NP   150 mg at 01/07/21 2224   Current Outpatient Medications  Medication Sig Dispense Refill   gabapentin (NEURONTIN) 300 MG capsule Take orally 2 caps (600mg ) in the morning and 3 caps (900mg ) in the evening 70 capsule 0   hydrOXYzine (ATARAX/VISTARIL) 25 MG tablet Take 1 tablet (25 mg total) by mouth 3 (three) times daily as needed for anxiety. 42 tablet 0   meloxicam (MOBIC) 7.5 MG tablet Take 1 tablet (7.5 mg total) by mouth daily. (Patient taking differently: Take 7.5 mg by mouth daily as needed. Foot pain) 30 tablet 0   QUEtiapine (SEROQUEL) 50 MG tablet Take 5 tablets (250 mg total) by mouth at bedtime. 70 tablet 0   rosuvastatin (CRESTOR) 20 MG tablet Take 1 tablet (20 mg total) by mouth daily. 30 tablet 6    traZODone (DESYREL) 150 MG tablet Take 1 tablet (150 mg total) by mouth at bedtime. 30 tablet 0   bictegravir-emtricitabine-tenofovir AF (BIKTARVY) 50-200-25 MG TABS tablet Take 1 tablet by mouth daily. 30 tablet 6    Labs  Lab Results:  Admission on 01/07/2021  Component Date Value Ref Range Status   SARS Coronavirus 2 by RT PCR 01/07/2021 NEGATIVE  NEGATIVE Final   Comment: (NOTE) SARS-CoV-2 target nucleic acids are NOT DETECTED.  The SARS-CoV-2 RNA is generally detectable in upper respiratory specimens during the acute phase of infection. The lowest concentration of SARS-CoV-2 viral copies this assay can detect is 138 copies/mL. A negative result does not preclude SARS-Cov-2 infection and should not be used as the sole basis for treatment or other patient management decisions. A negative result may occur with  improper specimen collection/handling, submission of specimen other than nasopharyngeal swab, presence of viral mutation(s) within the areas targeted by this assay, and inadequate number of viral copies(<138 copies/mL). A negative result must be combined with clinical observations, patient history, and epidemiological information. The expected result is Negative.  Fact Sheet for Patients:  EntrepreneurPulse.com.au  Fact Sheet for Healthcare Providers:  IncredibleEmployment.be  This test is no                          t yet approved or cleared by the [place] FDA and  has been authorized for detection and/or diagnosis of SARS-CoV-2 by FDA under an Emergency Use Authorization (EUA).  This EUA will remain  in effect (meaning this test can be used) for the duration of the COVID-19 declaration under Section 564(b)(1) of the Act, 21 U.S.C.section 360bbb-3(b)(1), unless the authorization is terminated  or revoked sooner.       Influenza A by PCR 01/07/2021 NEGATIVE  NEGATIVE Final   Influenza B by PCR 01/07/2021 NEGATIVE  NEGATIVE  Final   Comment: (NOTE) The Xpert Xpress SARS-CoV-2/FLU/RSV plus assay is intended as an aid in the diagnosis of influenza from Nasopharyngeal swab specimens and should not be used as a sole basis for treatment. Nasal washings and aspirates are unacceptable for Xpert Xpress SARS-CoV-2/FLU/RSV testing.  Fact Sheet for Patients: EntrepreneurPulse.com.au  Fact Sheet for Healthcare Providers: IncredibleEmployment.be  This test is not yet approved or cleared by the [place] FDA and has been authorized for detection and/or diagnosis of SARS-CoV-2 by FDA under an Emergency Use Authorization (EUA). This EUA will remain in effect (meaning this test can be used) for the duration of the COVID-19 declaration under Section 564(b)(1) of the Act, 21 U.S.C. section 360bbb-3(b)(1), unless the authorization is terminated or revoked.  Performed at [facility], [place] [address], [place], [name] [address]    WBC 01/07/2021 10.5  4.0 - 10.5 K/uL Final   RBC 01/07/2021 5.19  4.22 - 5.81 MIL/uL Final   Hemoglobin 01/07/2021 15.4  13.0 - 17.0 g/dL Final   HCT 01/07/2021 47.4  39.0 - 52.0 % Final   MCV 01/07/2021 91.3  80.0 - 100.0 fL Final   MCH 01/07/2021 29.7  26.0 - 34.0 pg Final   MCHC 01/07/2021 32.5  30.0 - 36.0 g/dL Final   RDW 01/07/2021 13.1  11.5 - 15.5 % Final   Platelets 01/07/2021 193  150 - 400 K/uL Final   nRBC 01/07/2021 0.0  0.0 - 0.2 % Final   Neutrophils Relative % 01/07/2021 71  % Final   Neutro Abs 01/07/2021 7.4  1.7 - 7.7 K/uL Final   Lymphocytes Relative 01/07/2021 22  % Final   Lymphs Abs 01/07/2021 2.3  0.7 - 4.0 K/uL Final   Monocytes Relative 01/07/2021 7  % Final   Monocytes Absolute 01/07/2021 0.7  0.1 - 1.0 K/uL Final   Eosinophils Relative 01/07/2021 0  % Final   Eosinophils Absolute 01/07/2021 0.0  0.0 - 0.5 K/uL Final   Basophils Relative 01/07/2021 0  % Final   Basophils Absolute 01/07/2021 0.0  0.0 - 0.1 K/uL Final    Immature Granulocytes 01/07/2021 0  % Final   Abs Immature Granulocytes 01/07/2021 0.03  0.00 - 0.07 K/uL Final   Performed at [facility], [place] [address] [place], [name] [address]   Sodium 01/07/2021 133 (L)  135 - 145 mmol/L Final   Potassium 01/07/2021 4.1  3.5 - 5.1 mmol/L Final   Chloride 01/07/2021 98  98 - 111 mmol/L Final   CO2 01/07/2021 24  22 - 32 mmol/L Final   Glucose, Bld 01/07/2021 103 (H)  70 - 99 mg/dL Final   Glucose reference range applies only to samples taken after fasting for at least 8 hours.   BUN 01/07/2021 19  6 - 20 mg/dL Final   Creatinine, Ser 01/07/2021 1.26 (H)  0.61 - 1.24 mg/dL Final   Calcium 01/07/2021 9.9  8.9 - 10.3 mg/dL Final   Total Protein 01/07/2021 8.4 (H)  6.5 - 8.1 g/dL Final   Albumin 01/07/2021 4.8  3.5 - 5.0 g/dL Final   AST 01/07/2021 102 (  H)  15 - 41 U/L Final   ALT 01/07/2021 57 (H)  0 - 44 U/L Final   Alkaline Phosphatase 01/07/2021 129 (H)  38 - 126 U/L Final   Total Bilirubin 01/07/2021 0.8  0.3 - 1.2 mg/dL Final   GFR, Estimated 01/07/2021 >60  >60 mL/min Final   Comment: (NOTE) Calculated using the CKD-EPI Creatinine Equation (2021)    Anion gap 01/07/2021 11  5 - 15 Final   Performed at [facility], [place] [address] [place], [place] [address]   Alcohol, Ethyl (B) 01/07/2021 <10  <10 mg/dL Final   Comment: (NOTE) Lowest detectable limit for serum alcohol is 10 mg/dL.  For medical purposes only. Performed at [facility], [place] [address] [place], [place] [address]    Cholesterol 01/07/2021 266 (H)  0 - 200 mg/dL Final   Triglycerides 01/07/2021 81  <150 mg/dL Final   HDL 01/07/2021 59  >40 mg/dL Final   Total CHOL/HDL Ratio 01/07/2021 4.5  RATIO Final   VLDL 01/07/2021 16  0 - 40 mg/dL Final   LDL Cholesterol 01/07/2021 191 (H)  0 - 99 mg/dL Final   Comment:        Total Cholesterol/HDL:CHD Risk Coronary Heart Disease Risk Table                     Men   Women  1/2 Average Risk   3.4   3.3   Average Risk       5.0   4.4  2 X Average Risk   9.6   7.1  3 X Average Risk  23.4   11.0        Use the calculated Patient Ratio above and the CHD Risk Table to determine the patient's CHD Risk.        ATP III CLASSIFICATION (LDL):  <100     mg/dL   Optimal  100-129  mg/dL   Near or Above                    Optimal  130-159  mg/dL   Borderline  160-189  mg/dL   High  >190     mg/dL   Very High Performed at [facility] [address], [place], [name] [address]    POC Amphetamine UR 01/07/2021 None Detected  NONE DETECTED (Cut Off Level 1000 ng/mL) Final   POC Secobarbital (BAR) 01/07/2021 None Detected  NONE DETECTED (Cut Off Level 300 ng/mL) Final   POC Buprenorphine (BUP) 01/07/2021 None Detected  NONE DETECTED (Cut Off Level 10 ng/mL) Final   POC Oxazepam (BZO) 01/07/2021 None Detected  NONE DETECTED (Cut Off Level 300 ng/mL) Final   POC Cocaine UR 01/07/2021 Positive (A)  NONE DETECTED (Cut Off Level 300 ng/mL) Final   POC Methamphetamine UR 01/07/2021 None Detected  NONE DETECTED (Cut Off Level 1000 ng/mL) Final   POC Morphine 01/07/2021 None Detected  NONE DETECTED (Cut Off Level 300 ng/mL) Final   POC Oxycodone UR 01/07/2021 None Detected  NONE DETECTED (Cut Off Level 100 ng/mL) Final   POC Methadone UR 01/07/2021 None Detected  NONE DETECTED (Cut Off Level 300 ng/mL) Final   POC Marijuana UR 01/07/2021 None Detected  NONE DETECTED (Cut Off Level 50 ng/mL) Final   SARSCOV2ONAVIRUS 2 AG 01/07/2021 NEGATIVE  NEGATIVE Final   Comment: (NOTE) SARS-CoV-2 antigen NOT DETECTED.   Negative results are presumptive.  Negative results do not preclude SARS-CoV-2 infection  and should not be used as the sole basis for treatment or other patient management decisions, including infection  control decisions, particularly in the presence of clinical signs and  symptoms consistent with COVID-19, or in those who have been in contact with the virus.  Negative results must be combined  with clinical observations, patient history, and epidemiological information. The expected result is Negative.  Fact Sheet for Patients: HandmadeRecipes.com.cy  Fact Sheet for Healthcare Providers: FuneralLife.at  This test is not yet approved or cleared by the [place] FDA and  has been authorized for detection and/or diagnosis of SARS-CoV-2 by FDA under an Emergency Use Authorization (EUA).  This EUA will remain in effect (meaning this test can be used) for the duration of  the COV                          ID-19 declaration under Section 564(b)(1) of the Act, 21 U.S.C. section 360bbb-3(b)(1), unless the authorization is terminated or revoked sooner.     SARS Coronavirus 2 Ag 01/07/2021 Negative  Negative Preliminary   Sodium 01/08/2021 137  135 - 145 mmol/L Final   Potassium 01/08/2021 3.7  3.5 - 5.1 mmol/L Final   Chloride 01/08/2021 103  98 - 111 mmol/L Final   CO2 01/08/2021 23  22 - 32 mmol/L Final   Glucose, Bld 01/08/2021 123 (H)  70 - 99 mg/dL Final   Glucose reference range applies only to samples taken after fasting for at least 8 hours.   BUN 01/08/2021 22 (H)  6 - 20 mg/dL Final   Creatinine, Ser 01/08/2021 1.19  0.61 - 1.24 mg/dL Final   Calcium 01/08/2021 9.4  8.9 - 10.3 mg/dL Final   Total Protein 01/08/2021 7.2  6.5 - 8.1 g/dL Final   Albumin 01/08/2021 4.0  3.5 - 5.0 g/dL Final   AST 01/08/2021 109 (H)  15 - 41 U/L Final   ALT 01/08/2021 56 (H)  0 - 44 U/L Final   Alkaline Phosphatase 01/08/2021 105  38 - 126 U/L Final   Total Bilirubin 01/08/2021 0.9  0.3 - 1.2 mg/dL Final   GFR, Estimated 01/08/2021 >60  >60 mL/min Final   Comment: (NOTE) Calculated using the CKD-EPI Creatinine Equation (2021)    Anion gap 01/08/2021 11  5 - 15 Final   Performed at [facility] [address] [place], [place] [address]  Appointment on 01/06/2021  Component Date Value Ref Range Status   CD4 T Cell Abs 01/06/2021  910  400 - 1,790 /uL Final   CD4 % Helper T Cell 01/06/2021 36  33 - 65 % Final   Performed at [facility], [place] [address] [place], [place] [address]   HIV 1 RNA Quant 01/06/2021 <20 (H)  Copies/mL Final   HIV-1 RNA Detected   HIV-1 RNA Quant, Log 01/06/2021 <1.30 (H)  Log cps/mL Final   Comment: HIV-1 RNA Detected . HIV-1 RNA was detected below 20 copies/mL. Viral nucleic acid detected below this level cannot be quantified by the assay. . . Reference Range:                           Not Detected     copies/mL                           Not Detected Log copies/mL . Marland Kitchen The  test was performed using Real-Time Polymerase Chain Reaction. . . Reportable Range: 20 copies/mL to 10,000,000 copies/mL (1.30 Log copies/mL to 7.00 Log copies/mL). .   Admission on 01/02/2021, Discharged on 01/03/2021  Component Date Value Ref Range Status   Lipase 01/03/2021 44  11 - 51 U/L Final   Performed at [facility], [place] [address], [place], [name] [address]   Sodium 01/03/2021 137  135 - 145 mmol/L Final   Potassium 01/03/2021 4.2  3.5 - 5.1 mmol/L Final   Chloride 01/03/2021 101  98 - 111 mmol/L Final   CO2 01/03/2021 30  22 - 32 mmol/L Final   Glucose, Bld 01/03/2021 123 (H)  70 - 99 mg/dL Final   Glucose reference range applies only to samples taken after fasting for at least 8 hours.   BUN 01/03/2021 12  6 - 20 mg/dL Final   Creatinine, Ser 01/03/2021 1.12  0.61 - 1.24 mg/dL Final   Calcium 01/03/2021 10.0  8.9 - 10.3 mg/dL Final   Total Protein 01/03/2021 7.6  6.5 - 8.1 g/dL Final   Albumin 01/03/2021 4.1  3.5 - 5.0 g/dL Final   AST 01/03/2021 27  15 - 41 U/L Final   ALT 01/03/2021 41  0 - 44 U/L Final   Alkaline Phosphatase 01/03/2021 110  38 - 126 U/L Final   Total Bilirubin 01/03/2021 0.5  0.3 - 1.2 mg/dL Final   GFR, Estimated 01/03/2021 >60  >60 mL/min Final   Comment: (NOTE) Calculated using the CKD-EPI Creatinine Equation (2021)    Anion gap  01/03/2021 6  5 - 15 Final   Performed at [facility] [address], [place], [name] [address]   WBC 01/03/2021 10.4  4.0 - 10.5 K/uL Final   RBC 01/03/2021 5.04  4.22 - 5.81 MIL/uL Final   Hemoglobin 01/03/2021 15.4  13.0 - 17.0 g/dL Final   HCT 01/03/2021 46.0  39.0 - 52.0 % Final   MCV 01/03/2021 91.3  80.0 - 100.0 fL Final   MCH 01/03/2021 30.6  26.0 - 34.0 pg Final   MCHC 01/03/2021 33.5  30.0 - 36.0 g/dL Final   RDW 01/03/2021 12.8  11.5 - 15.5 % Final   Platelets 01/03/2021 231  150 - 400 K/uL Final   nRBC 01/03/2021 0.0  0.0 - 0.2 % Final   Performed at [facility] [address] [place], [name] [address]   Color, Urine 01/03/2021 YELLOW  YELLOW Final   APPearance 01/03/2021 HAZY (A)  CLEAR Final   Specific Gravity, Urine 01/03/2021 1.015  1.005 - 1.030 Final   pH 01/03/2021 8.0  5.0 - 8.0 Final   Glucose, UA 01/03/2021 NEGATIVE  NEGATIVE mg/dL Final   Hgb urine dipstick 01/03/2021 NEGATIVE  NEGATIVE Final   Bilirubin Urine 01/03/2021 NEGATIVE  NEGATIVE Final   Ketones, ur 01/03/2021 NEGATIVE  NEGATIVE mg/dL Final   Protein, ur 01/03/2021 NEGATIVE  NEGATIVE mg/dL Final   Nitrite 01/03/2021 NEGATIVE  NEGATIVE Final   Leukocytes,Ua 01/03/2021 NEGATIVE  NEGATIVE Final   Comment: Microscopic not done on urines with negative protein, blood, leukocytes, nitrite, or glucose < 500 mg/dL. Performed at [facility], [place] [address] [place],  [address]   Admission on 10/25/2020, Discharged on 10/31/2020  Component Date Value Ref Range Status   SARS Coronavirus 2 by RT PCR 10/26/2020 NEGATIVE  NEGATIVE Final   Comment: (NOTE) SARS-CoV-2 target nucleic acids are NOT DETECTED.  The SARS-CoV-2 RNA is generally detectable  in upper respiratory specimens during the acute phase of infection. The lowest concentration of SARS-CoV-2 viral copies this assay can detect is 138 copies/mL. A negative result does not preclude SARS-Cov-2 infection and should not be  used as the sole basis for treatment or other patient management decisions. A negative result may occur with  improper specimen collection/handling, submission of specimen other than nasopharyngeal swab, presence of viral mutation(s) within the areas targeted by this assay, and inadequate number of viral copies(<138 copies/mL). A negative result must be combined with clinical observations, patient history, and epidemiological information. The expected result is Negative.  Fact Sheet for Patients:  EntrepreneurPulse.com.au  Fact Sheet for Healthcare Providers:  IncredibleEmployment.be  This test is no                          t yet approved or cleared by the [place] FDA and  has been authorized for detection and/or diagnosis of SARS-CoV-2 by FDA under an Emergency Use Authorization (EUA). This EUA will remain  in effect (meaning this test can be used) for the duration of the COVID-19 declaration under Section 564(b)(1) of the Act, 21 U.S.C.section 360bbb-3(b)(1), unless the authorization is terminated  or revoked sooner.       Influenza A by PCR 10/26/2020 NEGATIVE  NEGATIVE Final   Influenza B by PCR 10/26/2020 NEGATIVE  NEGATIVE Final   Comment: (NOTE) The Xpert Xpress SARS-CoV-2/FLU/RSV plus assay is intended as an aid in the diagnosis of influenza from Nasopharyngeal swab specimens and should not be used as a sole basis for treatment. Nasal washings and aspirates are unacceptable for Xpert Xpress SARS-CoV-2/FLU/RSV testing.  Fact Sheet for Patients: EntrepreneurPulse.com.au  Fact Sheet for Healthcare Providers: IncredibleEmployment.be  This test is not yet approved or cleared by the [place] FDA and has been authorized for detection and/or diagnosis of SARS-CoV-2 by FDA under an Emergency Use Authorization (EUA). This EUA will remain in effect (meaning this test can be used) for the  duration of the COVID-19 declaration under Section 564(b)(1) of the Act, 21 U.S.C. section 360bbb-3(b)(1), unless the authorization is terminated or revoked.  Performed at [facility], [place] [address] [place], [name] [address]    SARS Coronavirus 2 Ag 10/26/2020 Negative  Negative Preliminary   WBC 10/26/2020 7.7  4.0 - 10.5 K/uL Final   RBC 10/26/2020 4.58  4.22 - 5.81 MIL/uL Final   Hemoglobin 10/26/2020 14.1  13.0 - 17.0 g/dL Final   HCT 10/26/2020 41.4  39.0 - 52.0 % Final   MCV 10/26/2020 90.4  80.0 - 100.0 fL Final   MCH 10/26/2020 30.8  26.0 - 34.0 pg Final   MCHC 10/26/2020 34.1  30.0 - 36.0 g/dL Final   RDW 10/26/2020 12.6  11.5 - 15.5 % Final   Platelets 10/26/2020 218  150 - 400 K/uL Final   nRBC 10/26/2020 0.0  0.0 - 0.2 % Final   Neutrophils Relative % 10/26/2020 58  % Final   Neutro Abs 10/26/2020 4.4  1.7 - 7.7 K/uL Final   Lymphocytes Relative 10/26/2020 36  % Final   Lymphs Abs 10/26/2020 2.8  0.7 - 4.0 K/uL Final   Monocytes Relative 10/26/2020 5  % Final   Monocytes Absolute 10/26/2020 0.4  0.1 - 1.0 K/uL Final   Eosinophils Relative 10/26/2020 1  % Final   Eosinophils Absolute 10/26/2020 0.1  0.0 - 0.5 K/uL Final   Basophils Relative 10/26/2020 0  % Final  Basophils Absolute 10/26/2020 0.0  0.0 - 0.1 K/uL Final   Immature Granulocytes 10/26/2020 0  % Final   Abs Immature Granulocytes 10/26/2020 0.00  0.00 - 0.07 K/uL Final   Performed at [facility], [place] [address] [place], [name] [address]   Sodium 10/26/2020 136  135 - 145 mmol/L Final   Potassium 10/26/2020 3.8  3.5 - 5.1 mmol/L Final   Chloride 10/26/2020 105  98 - 111 mmol/L Final   CO2 10/26/2020 21 (L)  22 - 32 mmol/L Final   Glucose, Bld 10/26/2020 151 (H)  70 - 99 mg/dL Final   Glucose reference range applies only to samples taken after fasting for at least 8 hours.   BUN 10/26/2020 15  6 - 20 mg/dL Final   Creatinine, Ser 10/26/2020 1.16  0.61 - 1.24 mg/dL Final   Calcium  10/26/2020 9.7  8.9 - 10.3 mg/dL Final   Total Protein 10/26/2020 6.9  6.5 - 8.1 g/dL Final   Albumin 10/26/2020 4.1  3.5 - 5.0 g/dL Final   AST 10/26/2020 34  15 - 41 U/L Final   ALT 10/26/2020 38  0 - 44 U/L Final   Alkaline Phosphatase 10/26/2020 91  38 - 126 U/L Final   Total Bilirubin 10/26/2020 0.7  0.3 - 1.2 mg/dL Final   GFR, Estimated 10/26/2020 >60  >60 mL/min Final   Comment: (NOTE) Calculated using the CKD-EPI Creatinine Equation (2021)    Anion gap 10/26/2020 10  5 - 15 Final   Performed at [place] [address] [place], [name] [address]   Hgb A1c MFr Bld 10/26/2020 6.0 (H)  4.8 - 5.6 % Final   Comment: (NOTE) Pre diabetes:          5.7%-6.4%  Diabetes:              >6.4%  Glycemic control for   <7.0% adults with diabetes    Mean Plasma Glucose 10/26/2020 125.5  mg/dL Final   Performed at [facility], [place] [address], [place], [place] [address]   Alcohol, Ethyl (B) 10/26/2020 <10  <10 mg/dL Final   Comment: (NOTE) Lowest detectable limit for serum alcohol is 10 mg/dL.  For medical purposes only. Performed at [facility], [place] [address], [place], [place] [address]    Cholesterol 10/26/2020 153  0 - 200 mg/dL Final   Triglycerides 10/26/2020 80  <150 mg/dL Final   HDL 10/26/2020 43  >40 mg/dL Final   Total CHOL/HDL Ratio 10/26/2020 3.6  RATIO Final   VLDL 10/26/2020 16  0 - 40 mg/dL Final   LDL Cholesterol 10/26/2020 94  0 - 99 mg/dL Final   Comment:        Total Cholesterol/HDL:CHD Risk Coronary Heart Disease Risk Table                     Men   Women  1/2 Average Risk   3.4   3.3  Average Risk       5.0   4.4  2 X Average Risk   9.6   7.1  3 X Average Risk  23.4   11.0        Use the calculated Patient Ratio above and the CHD Risk Table to determine the patient's CHD Risk.        ATP III CLASSIFICATION (LDL):  <100     mg/dL   Optimal  100-129  mg/dL   Near  or Above                    Optimal  130-159  mg/dL    Borderline  160-189  mg/dL   High  >190     mg/dL   Very High Performed at [facility] [address], [place], [place] [address]    TSH 10/26/2020 0.512  0.350 - 4.500 uIU/mL Final   Comment: Performed by a 3rd Generation assay with a functional sensitivity of <=0.01 uIU/mL. Performed at [facility], [place] [address], [place], [name] [address]    POC Amphetamine UR 10/26/2020 None Detected  NONE DETECTED (Cut Off Level 1000 ng/mL) Final   POC Secobarbital (BAR) 10/26/2020 None Detected  NONE DETECTED (Cut Off Level 300 ng/mL) Final   POC Buprenorphine (BUP) 10/26/2020 None Detected  NONE DETECTED (Cut Off Level 10 ng/mL) Final   POC Oxazepam (BZO) 10/26/2020 Positive (A)  NONE DETECTED (Cut Off Level 300 ng/mL) Final   POC Cocaine UR 10/26/2020 Positive (A)  NONE DETECTED (Cut Off Level 300 ng/mL) Final   POC Methamphetamine UR 10/26/2020 None Detected  NONE DETECTED (Cut Off Level 1000 ng/mL) Final   POC Morphine 10/26/2020 None Detected  NONE DETECTED (Cut Off Level 300 ng/mL) Final   POC Oxycodone UR 10/26/2020 None Detected  NONE DETECTED (Cut Off Level 100 ng/mL) Final   POC Methadone UR 10/26/2020 None Detected  NONE DETECTED (Cut Off Level 300 ng/mL) Final   POC Marijuana UR 10/26/2020 None Detected  NONE DETECTED (Cut Off Level 50 ng/mL) Final   Group A Strep by PCR 10/30/2020 NOT DETECTED  NOT DETECTED Final   Performed at [facility], [place] [address] [place],  [address]  Admission on 08/24/2020, Discharged on 08/24/2020  Component Date Value Ref Range Status   WBC 08/24/2020 11.5 (H)  4.0 - 10.5 K/uL Final   RBC 08/24/2020 4.93  4.22 - 5.81 MIL/uL Final   Hemoglobin 08/24/2020 15.0  13.0 - 17.0 g/dL Final   HCT 08/24/2020 46.0  39.0 - 52.0 % Final   MCV 08/24/2020 93.3  80.0 - 100.0 fL Final   MCH 08/24/2020 30.4  26.0 - 34.0 pg Final   MCHC 08/24/2020 32.6  30.0 - 36.0 g/dL Final   RDW 08/24/2020 13.0  11.5 - 15.5 % Final   Platelets 08/24/2020  217  150 - 400 K/uL Final   nRBC 08/24/2020 0.0  0.0 - 0.2 % Final   Neutrophils Relative % 08/24/2020 73  % Final   Neutro Abs 08/24/2020 8.3 (H)  1.7 - 7.7 K/uL Final   Lymphocytes Relative 08/24/2020 20  % Final   Lymphs Abs 08/24/2020 2.3  0.7 - 4.0 K/uL Final   Monocytes Relative 08/24/2020 7  % Final   Monocytes Absolute 08/24/2020 0.8  0.1 - 1.0 K/uL Final   Eosinophils Relative 08/24/2020 0  % Final   Eosinophils Absolute 08/24/2020 0.0  0.0 - 0.5 K/uL Final   Basophils Relative 08/24/2020 0  % Final   Basophils Absolute 08/24/2020 0.0  0.0 - 0.1 K/uL Final   Immature Granulocytes 08/24/2020 0  % Final   Abs Immature Granulocytes 08/24/2020 0.04  0.00 - 0.07 K/uL Final   Performed at [facility], [place] [address] [place], [name] [address]   Sodium 08/24/2020 138  135 - 145 mmol/L Final   Potassium 08/24/2020 4.3  3.5 - 5.1 mmol/L Final   Chloride 08/24/2020 103  98 -  111 mmol/L Final   CO2 08/24/2020 26  22 - 32 mmol/L Final   Glucose, Bld 08/24/2020 88  70 - 99 mg/dL Final   Glucose reference range applies only to samples taken after fasting for at least 8 hours.   BUN 08/24/2020 15  6 - 20 mg/dL Final   Creatinine, Ser 08/24/2020 1.21  0.61 - 1.24 mg/dL Final   Calcium 08/24/2020 9.8  8.9 - 10.3 mg/dL Final   Total Protein 08/24/2020 8.7 (H)  6.5 - 8.1 g/dL Final   Albumin 08/24/2020 4.8  3.5 - 5.0 g/dL Final   AST 08/24/2020 49 (H)  15 - 41 U/L Final   ALT 08/24/2020 47 (H)  0 - 44 U/L Final   Alkaline Phosphatase 08/24/2020 90  38 - 126 U/L Final   Total Bilirubin 08/24/2020 0.7  0.3 - 1.2 mg/dL Final   GFR, Estimated 08/24/2020 >60  >60 mL/min Final   Comment: (NOTE) Calculated using the CKD-EPI Creatinine Equation (2021)    Anion gap 08/24/2020 9  5 - 15 Final   Performed at [facility], [place] [address] [place], [place] [address]   Alcohol, Ethyl (B) 08/24/2020 <10  <10 mg/dL Final   Comment: (NOTE) Lowest detectable limit  for serum alcohol is 10 mg/dL.  For medical purposes only. Performed at [facility], [place] [address], [place], [name] [address]    Acetaminophen (Tylenol), Serum 08/24/2020 <10 (L)  10 - 30 ug/mL Final   Comment: (NOTE) Therapeutic concentrations vary significantly. A range of 10-30 ug/mL  may be an effective concentration for many patients. However, some  are best treated at concentrations outside of this range. Acetaminophen concentrations >150 ug/mL at 4 hours after ingestion  and >50 ug/mL at 12 hours after ingestion are often associated with  toxic reactions.  Performed at [facility], [place] [address] [place], [name] [address]    Troponin I (High Sensitivity) 08/24/2020 5  <18 ng/L Final   Comment: (NOTE) Elevated high sensitivity troponin I (hsTnI) values and significant  changes across serial measurements may suggest ACS but many other  chronic and acute conditions are known to elevate hsTnI results.  Refer to the "Links" section for chest pain algorithms and additional  guidance. Performed at [facility], [place] [address], [place], [place] [address]    SARS Coronavirus 2 by RT PCR 08/24/2020 NEGATIVE  NEGATIVE Final   Comment: (NOTE) SARS-CoV-2 target nucleic acids are NOT DETECTED.  The SARS-CoV-2 RNA is generally detectable in upper respiratory specimens during the acute phase of infection. The lowest concentration of SARS-CoV-2 viral copies this assay can detect is 138 copies/mL. A negative result does not preclude SARS-Cov-2 infection and should not be used as the sole basis for treatment or other patient management decisions. A negative result may occur with  improper specimen collection/handling, submission of specimen other than nasopharyngeal swab, presence of viral mutation(s) within the areas targeted by this assay, and inadequate number of viral copies(<138 copies/mL). A negative result must be  combined with clinical observations, patient history, and epidemiological information. The expected result is Negative.  Fact Sheet for Patients:  EntrepreneurPulse.com.au  Fact Sheet for Healthcare Providers:  IncredibleEmployment.be  This test is no                          t yet approved or cleared by the [place] FDA and  has been authorized for detection  and/or diagnosis of SARS-CoV-2 by FDA under an Emergency Use Authorization (EUA). This EUA will remain  in effect (meaning this test can be used) for the duration of the COVID-19 declaration under Section 564(b)(1) of the Act, 21 U.S.C.section 360bbb-3(b)(1), unless the authorization is terminated  or revoked sooner.       Influenza A by PCR 08/24/2020 NEGATIVE  NEGATIVE Final   Influenza B by PCR 08/24/2020 NEGATIVE  NEGATIVE Final   Comment: (NOTE) The Xpert Xpress SARS-CoV-2/FLU/RSV plus assay is intended as an aid in the diagnosis of influenza from Nasopharyngeal swab specimens and should not be used as a sole basis for treatment. Nasal washings and aspirates are unacceptable for Xpert Xpress SARS-CoV-2/FLU/RSV testing.  Fact Sheet for Patients: EntrepreneurPulse.com.au  Fact Sheet for Healthcare Providers: IncredibleEmployment.be  This test is not yet approved or cleared by the [place] FDA and has been authorized for detection and/or diagnosis of SARS-CoV-2 by FDA under an Emergency Use Authorization (EUA). This EUA will remain in effect (meaning this test can be used) for the duration of the COVID-19 declaration under Section 564(b)(1) of the Act, 21 U.S.C. section 360bbb-3(b)(1), unless the authorization is terminated or revoked.  Performed at [facility], [place] [address], [place], [place] [address]   Office Visit on 07/14/2020  Component Date Value Ref Range Status   Vitamin B-12 07/14/2020 403  200 - 1,100  pg/mL Final   Sed Rate 07/14/2020 2  0 - 20 mm/h Final   T3 Uptake 07/14/2020 29  22 - 35 % Final   T4, Total 07/14/2020 6.4  4.9 - 10.5 mcg/dL Final   Free Thyroxine Index 07/14/2020 1.9  1.4 - 3.8 Final   TSH 07/14/2020 1.59  0.40 - 4.50 mIU/L Final    Blood Alcohol level:  Lab Results  Component Value Date   ETH <10 01/07/2021   ETH <10 40/08/6759    Metabolic Disorder Labs: Lab Results  Component Value Date   HGBA1C 6.0 (H) 10/26/2020   MPG 125.5 10/26/2020   MPG 131.24 04/28/2020   Lab Results  Component Value Date   PROLACTIN 12.1 03/08/2020   PROLACTIN 2.6 (L) 11/09/2019   Lab Results  Component Value Date   CHOL 266 (H) 01/07/2021   TRIG 81 01/07/2021   HDL 59 01/07/2021   CHOLHDL 4.5 01/07/2021   VLDL 16 01/07/2021   LDLCALC 191 (H) 01/07/2021   LDLCALC 94 10/26/2020    Therapeutic Lab Levels: No results found for: LITHIUM No results found for: VALPROATE No components found for:  CBMZ  Physical Findings   AIMS    Flowsheet Row Admission (Discharged) from 04/28/2020 in [facility] Admission (Discharged) from 03/07/2020 in [facility]  AIMS Total Score 0 0      AUDIT    Flowsheet Row Admission (Discharged) from 04/28/2020 in [facility] Admission (Discharged) from 03/07/2020 in [facility] Admission (Discharged) from 12/12/2011 in [facility]  Alcohol Use Disorder Identification Test Final Score (AUDIT) 3 13 8       GAD-7    Flowsheet Row Office Visit from 03/17/2020 in [place] Office Visit from 07/22/2019 in [facility] Office Visit from 09/03/2018 in [facility]  Total GAD-7 Score 16 19 19       PHQ2-9    [facility] from 10/25/2020 in [facility] Video Visit from 04/05/2020 in  [facility] Office Visit from 03/17/2020 in [facility] ED from 11/09/2019 in [facility] Office Visit from 07/22/2019 in [facility]  PHQ-2 Total Score 5 0 1 6 4   PHQ-9 Total Score 15 -- 9 19 15       Flowsheet Row ED from 01/07/2021 in [facility] ED from 01/02/2021 in [facility] from 10/25/2020 in [place] [facility] CATEGORY High Risk No Risk Low Risk        Musculoskeletal  Strength & Muscle Tone: within normal limits Gait & Station:  slow, halting Patient leans: N/A  Psychiatric Specialty Exam  Presentation  General Appearance: Appropriate for Environment  Eye Contact:Good  Speech:Clear and Coherent  Speech Volume:Normal  Handedness:Right   Mood and Affect  Mood:-- (Depressed)  Affect:Congruent   Thought Process  Thought Processes:Coherent  Descriptions of Associations:Intact  Orientation:Full (Time, Place and Person)  Thought Content:Rumination (ambivalence towards SI (passive), generally future oriented)  Diagnosis of Schizophrenia or Schizoaffective disorder in past: Yes  Duration of Psychotic Symptoms: Greater than six months   Hallucinations:Hallucinations: Auditory (Endorsing hypnogogic hallucinations only) Description of Auditory Hallucinations: People he knows saying unkind things  Ideas of Reference:None  Suicidal Thoughts:Suicidal Thoughts: Yes, Passive SI Passive Intent and/or Plan: Without Plan  Homicidal Thoughts:Homicidal Thoughts: No   Sensorium  Memory:Immediate Good; Recent Good; Remote Fair  Judgment:Poor  Insight:Fair   Executive Functions  Concentration:Fair  Attention Span:Fair  [place] of Knowledge:Good  Language:Good   Psychomotor Activity  Psychomotor Activity:Psychomotor  Activity: Normal   Assets  Assets:Communication Skills; Desire for Improvement; Housing   Sleep  Sleep:Sleep: -- (Pt reported poor sleep) Number of Hours of Sleep: 0 (many)   No data recorded  Physical Exam  Physical Exam ROS Blood pressure 134/79, pulse 100, temperature 97.7 F (36.5 C), temperature source Temporal, resp. rate 18, SpO2 97 %. There is no height or weight on file to calculate BMI.  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 Now with some improvement in mood, currently ambivalent about suicide but largely future oriented on interview. Compliant with all meds except for seroquel and trazodone.   Treatment Plan Summary: Daily contact with patient to assess and evaluate symptoms and progress in treatment and Medication management Increase cymbalta to 90 mg to better target depression as appears to be at least partially effective  Continue other medications as below:  bictegravir-emtricitabine-tenofovir AF  1 tablet Oral Daily   [START ON 01/10/2021] DULoxetine  90 mg Oral Daily   gabapentin  600 mg Oral q AM   gabapentin  900 mg Oral QHS   QUEtiapine  250 mg Oral q1800   rosuvastatin  20 mg Oral Daily   traZODone  150 mg Oral QHS   After federal holiday, have SW discuss options to evict landlord Place ortho referral to Dr. Sharol Given prior to dc.    Kerrie [place] Vienne Corcoran 01/09/2021 12:39 PM

## 2021-01-09 NOTE — ED Notes (Signed)
Patient declined to attend group due to him wanting to sleep even after encouragement from staff.

## 2021-01-09 NOTE — ED Notes (Signed)
Pt sleeping in no acute distress. RR even and unlabored. Safety maintained. 

## 2021-01-09 NOTE — ED Notes (Signed)
Appears to be resting quietly at this time , eyes closed , respirations even and unlabored , no distress noted . Will continue to monitor for safety

## 2021-01-09 NOTE — ED Notes (Signed)
Pt is currently sleeping, no distress noted, environmental check complete, will continue to monitor patient for safety. ? ?

## 2021-01-09 NOTE — ED Notes (Signed)
Patient A&Ox4. Denies intent to harm self/others when asked. Denies A/VH. Patient denies any physical complaints when asked. No acute distress noted. Routine safety checks conducted according to facility protocol. Encouraged patient to notify staff if thoughts of harm toward self or others arise. Patient verbalize understanding and agreement. Patient remains safe and patient verbally contracts for safety at this time. Will continue to monitor.

## 2021-01-09 NOTE — ED Notes (Signed)
PT is in the dining room eating a snack, no distress noted. Will continue to monitor patient for safety.

## 2021-01-10 DIAGNOSIS — Z20822 Contact with and (suspected) exposure to covid-19: Secondary | ICD-10-CM | POA: Diagnosis not present

## 2021-01-10 DIAGNOSIS — R45851 Suicidal ideations: Secondary | ICD-10-CM | POA: Diagnosis not present

## 2021-01-10 DIAGNOSIS — F251 Schizoaffective disorder, depressive type: Secondary | ICD-10-CM | POA: Diagnosis not present

## 2021-01-10 MED ORDER — FAMOTIDINE 10 MG PO TABS
10.0000 mg | ORAL_TABLET | Freq: Once | ORAL | Status: AC
  Administered 2021-01-10: 10 mg via ORAL
  Filled 2021-01-10: qty 1

## 2021-01-10 MED ORDER — ONDANSETRON 4 MG PO TBDP
4.0000 mg | ORAL_TABLET | Freq: Once | ORAL | Status: AC
  Administered 2021-01-10: 4 mg via ORAL
  Filled 2021-01-10: qty 1

## 2021-01-10 MED ORDER — PANTOPRAZOLE SODIUM 20 MG PO TBEC
20.0000 mg | DELAYED_RELEASE_TABLET | Freq: Every day | ORAL | Status: DC
  Administered 2021-01-11: 20 mg via ORAL
  Filled 2021-01-10: qty 1
  Filled 2021-01-10: qty 14

## 2021-01-10 NOTE — ED Notes (Signed)
Pt is sleeping at present. No acute distress noted. Respirations are even and unlabored. Will continue to monitor for safety.

## 2021-01-10 NOTE — ED Notes (Signed)
Patient denies [place]. Patient is cooperative and interacts well with staff. Respiratory is even and unlabored. No distress noted. Patient resting in bed at present. Patient stated no complaints at present. will continue to monitor for safety.

## 2021-01-10 NOTE — ED Notes (Signed)
Patient is asleep at present without distress or complaint.  Will monitor.

## 2021-01-10 NOTE — ED Notes (Signed)
Medication for the acid reflux given as ordered by provider.

## 2021-01-10 NOTE — ED Notes (Signed)
Pt is currently sleeping, no distress noted, environmental check complete, will continue to monitor patient for safety. ? ?

## 2021-01-10 NOTE — Progress Notes (Signed)
Patient is calm and pleasant on unit.  Asks to have needs met appropriately.  He has eaten well and has slept on and off all day.  No somatic complaints.  Denies avh shi or plan.  Will continue to monitor.

## 2021-01-10 NOTE — ED Notes (Signed)
Pt was vomiting in he bathroom, nurse asked patient how he was doing and patient stated he has bad acid reflux. Provider Lindon Romp NP notified
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
## 2021-01-10 NOTE — ED Provider Notes (Signed)
Behavioral Health Progress Note  Date and Time: 01/10/2021 1:17 PM Name: Dylan [name] MRN:  [id number]  Subjective:   56 year old male with psychiatric history of schizoaffective disorder depressive type, generalized anxiety, suicidal ideation substance abuse, and substance-induced mood disorder.  Patient presented voluntarily to [facility] via law enforcement on 01/07/21. With worsening depression and suicidal ideation with a plan to jump off a parking garage. Patient was admitted to the [facility] for continued treatment.  Patient seen and chart reviewed-patient has been medication compliant has been appropriate with staff and peers on the unit.  Patient interviewed this morning in his room.  On approach, patient is lying in bed in no acute distress.  Upon introduction, patient is amenable to engage in interview this morning.  Patient states that he has been "thinking a lot" and "trying to get my life back on track".  Patient recalls previous hospitalization at the [facility] in October and states upon discharge he stayed the [place] for a while but reports that he ultimately left the [facility] because "that place was crazy".  Patient states that he then returned to his apartment where an individual has been reportedly residing illegally using drugs.  Patient describes similar situation that he described during last admission at the [facility]; patient states that a male moved into his apartment and has been supplying him with drugs and has refused to leave when asked.  Patient states that this individual's name is not on the lease but that he refuses to leave when he is asked that he has a key to enter the apartment.  Patient discusses difficulty dealing with this individuals residing illegally in his  apartment.  Patient expresses that he may need to get the police involved to remove him from the premises (this was discussed during last admission although he declined to get law enforcement involved at this time).  Patient  states that he is interested in inpatient rehab; however, has concerns about his pet dog.  Patient states that he has a dog and knows that it would not be able to come with him to rehab; however, he states that his niece may be able to watch the dog. Encouraged patient to reach out to his niece regarding possibly housing the dog while he attends rehab.  He reports passive SI without intent or plan.  He reports passive HI directed toward the individual who is moved into his apartment and declines to leave.  He denies AVH.  Patient states that he last heard voices prior to admission to the [facility] which sound like "yelling and screaming" and are described as sounding like his mother.  Discussed with patient that he received increased Cymbalta dose today for the first time and that affects will continue to be monitored.  Patient verbalized understanding and expressed desire for residential rehab.  Diagnosis:  Final diagnoses:  Schizoaffective disorder, depressive type ([place])  Suicidal ideation    Total Time spent with patient: 20 minutes  Past Psychiatric History: schizoaffective disorder, substance use Past Medical History:  Past Medical History:  Diagnosis Date   Anxiety    Arthritis    Bipolar 1 disorder ([place])    Colon polyps    Depression    GERD (gastroesophageal reflux disease)    Hepatitis B    Human immunodeficiency virus (HIV) ([place])    Hyperlipidemia    Hypertension    Neuromuscular disorder ([place])    neuropathy   Neuropathy    Pre-diabetes    Schizophrenia ([place])  Past Surgical History:  Procedure Laterality Date   FOOT ARTHRODESIS Right 09/17/2019   Procedure: FUSION RIGHT LISFRANC JOINT;  Surgeon: Newt Minion, MD;  Location: [place];  Service: Orthopedics;  Laterality: Right;   FOOT ARTHRODESIS Right 09/2019   HEMORROIDECTOMY     IR FLUORO GUIDE CV LINE RIGHT  02/22/2017   IR US GUIDE VASC ACCESS RIGHT  02/22/2017   TUMOR REMOVAL     From Chest   Family History:  Family  History  Problem Relation Age of Onset   CAD Mother    Diabetes Father    Colon cancer Maternal Aunt 60   Diabetes Maternal Aunt    Heart disease Maternal Uncle    Esophageal cancer Neg Hx    Rectal cancer Neg Hx    Stomach cancer Neg Hx    Family Psychiatric  History: see H&P Social History:  Social History   Substance and Sexual Activity  Alcohol Use Yes   Comment: occassional use on weekends     Social History   Substance and Sexual Activity  Drug Use Yes   Types: Cocaine, Marijuana   Comment: last use was Friday 03/05/2020    Social History   Socioeconomic History   Marital status: Married    Spouse name: Not on file   Number of children: 1   Years of education: Not on file   Highest education level: Not on file  Occupational History   Not on file  Tobacco Use   Smoking status: Every Day    Packs/day: 0.50    Years: 15.00    Pack years: 7.50    Types: E-cigarettes, Cigarettes   Smokeless tobacco: Never   Tobacco comments:    has patches when he is ready to quit  Vaping Use   Vaping Use: Some days   Substances: Nicotine  Substance and Sexual Activity   Alcohol use: Yes    Comment: occassional use on weekends   Drug use: Yes    Types: Cocaine, Marijuana    Comment: last use was Friday 03/05/2020   Sexual activity: Yes    Partners: Female    Birth control/protection: Condom    Comment: condoms given  Other Topics Concern   Not on file  Social History Narrative   Not on file   Social Determinants of Health   Financial Resource Strain: Not on file  Food Insecurity: Not on file  Transportation Needs: Not on file  Physical Activity: Not on file  Stress: Not on file  Social Connections: Not on file   SDOH:  SDOH Screenings   Alcohol Screen: Low Risk    Last Alcohol Screening Score (AUDIT): 3  Depression (PHQ2-9): Medium Risk   PHQ-2 Score: 15  Financial Resource Strain: Not on file  Food Insecurity: Not on file  Housing: Not on file   Physical Activity: Not on file  Social Connections: Not on file  Stress: Not on file  Tobacco Use: High Risk   Smoking Tobacco Use: Every Day   Smokeless Tobacco Use: Never   Passive Exposure: Not on file  Transportation Needs: Not on file   Additional Social History:    Pain Medications: See MAR Prescriptions: See MAR Over the Counter: See MAR History of alcohol / drug use?: Yes Longest period of sobriety (when/how long): 2 years Negative Consequences of Use: Financial, Legal Withdrawal Symptoms: None Name of Substance 1: Alcohol 1 - Age of First Use: Adolescent 1 - Amount (size/oz): 1-2 beers 1 -  Frequency: 1-2 times per week 1 - Duration: Ongoing 1 - Last Use / Amount: 12/28/2020, 2 cans of beer 1 - Method of Aquiring: Store 1- Route of Use: Oral ingestion Name of Substance 2: Marijuana 2 - Age of First Use: 16 2 - Amount (size/oz): 1-2 joints 2 - Frequency: 4-5 times per month 2 - Duration: Ongoing 2 - Last Use / Amount: 3 days ago 2 - Method of Aquiring: unknown 2 - Route of Substance Use: Smoking Name of Substance 3: Cocaine 3 - Age of First Use: unknown 3 - Amount (size/oz): Varies 3 - Frequency: Approximately once per month 3 - Duration: Ongoing 3 - Last Use / Amount: 3 days ago 3 - Method of Aquiring: unknown 3 - Route of Substance Use: Inhaling              Sleep: Fair  Appetite:  Fair  Current Medications:  Current Facility-Administered Medications  Medication Dose Route Frequency Provider Last Rate Last Admin   acetaminophen (TYLENOL) tablet 650 mg  650 mg Oral Q6H PRN Ajibola, Ene A, NP       alum & mag hydroxide-simeth (MAALOX/MYLANTA) 200-200-20 MG/5ML suspension 30 mL  30 mL Oral Q4H PRN Ajibola, Ene A, NP       bictegravir-emtricitabine-tenofovir AF (BIKTARVY) 50-200-25 MG per tablet 1 tablet  1 tablet Oral Daily Ajibola, Ene A, NP   1 tablet at 01/10/21 0914   DULoxetine (CYMBALTA) DR capsule 90 mg  90 mg Oral Daily Cinderella, Margaret A    90 mg at 01/10/21 0914   gabapentin (NEURONTIN) capsule 600 mg  600 mg Oral q AM Ajibola, Ene A, NP   600 mg at 01/10/21 0915   gabapentin (NEURONTIN) capsule 900 mg  900 mg Oral QHS Ajibola, Ene A, NP   900 mg at 01/09/21 2137   hydrOXYzine (ATARAX) tablet 25 mg  25 mg Oral TID PRN Ajibola, Ene A, NP       magnesium hydroxide (MILK OF MAGNESIA) suspension 30 mL  30 mL Oral Daily PRN Ajibola, Ene A, NP       nicotine polacrilex (NICORETTE) gum 2 mg  2 mg Oral TID PRN Cinderella, Margaret A   2 mg at 01/10/21 1132   QUEtiapine (SEROQUEL) tablet 250 mg  250 mg Oral q1800 Cinderella, Margaret A   250 mg at 01/09/21 1723   rosuvastatin (CRESTOR) tablet 20 mg  20 mg Oral Daily Ajibola, Ene A, NP   20 mg at 01/10/21 0915   traZODone (DESYREL) tablet 150 mg  150 mg Oral QHS Ajibola, Ene A, NP   150 mg at 01/09/21 2137   Current Outpatient Medications  Medication Sig Dispense Refill   gabapentin (NEURONTIN) 300 MG capsule Take orally 2 caps (600mg ) in the morning and 3 caps (900mg ) in the evening 70 capsule 0   hydrOXYzine (ATARAX/VISTARIL) 25 MG tablet Take 1 tablet (25 mg total) by mouth 3 (three) times daily as needed for anxiety. 42 tablet 0   meloxicam (MOBIC) 7.5 MG tablet Take 1 tablet (7.5 mg total) by mouth daily. (Patient taking differently: Take 7.5 mg by mouth daily as needed. Foot pain) 30 tablet 0   QUEtiapine (SEROQUEL) 50 MG tablet Take 5 tablets (250 mg total) by mouth at bedtime. 70 tablet 0   rosuvastatin (CRESTOR) 20 MG tablet Take 1 tablet (20 mg total) by mouth daily. 30 tablet 6   traZODone (DESYREL) 150 MG tablet Take 1 tablet (150 mg total) by mouth at bedtime.  30 tablet 0   bictegravir-emtricitabine-tenofovir AF (BIKTARVY) 50-200-25 MG TABS tablet Take 1 tablet by mouth daily. 30 tablet 6    Labs  Lab Results:  Admission on 01/07/2021  Component Date Value Ref Range Status   SARS Coronavirus 2 by RT PCR 01/07/2021 NEGATIVE  NEGATIVE Final   Comment: (NOTE) SARS-CoV-2  target nucleic acids are NOT DETECTED.  The SARS-CoV-2 RNA is generally detectable in upper respiratory specimens during the acute phase of infection. The lowest concentration of SARS-CoV-2 viral copies this assay can detect is 138 copies/mL. A negative result does not preclude SARS-Cov-2 infection and should not be used as the sole basis for treatment or other patient management decisions. A negative result may occur with  improper specimen collection/handling, submission of specimen other than nasopharyngeal swab, presence of viral mutation(s) within the areas targeted by this assay, and inadequate number of viral copies(<138 copies/mL). A negative result must be combined with clinical observations, patient history, and epidemiological information. The expected result is Negative.  Fact Sheet for Patients:  EntrepreneurPulse.com.au  Fact Sheet for Healthcare Providers:  IncredibleEmployment.be  This test is no                          t yet approved or cleared by the [place] FDA and  has been authorized for detection and/or diagnosis of SARS-CoV-2 by FDA under an Emergency Use Authorization (EUA). This EUA will remain  in effect (meaning this test can be used) for the duration of the COVID-19 declaration under Section 564(b)(1) of the Act, 21 U.S.C.section 360bbb-3(b)(1), unless the authorization is terminated  or revoked sooner.       Influenza A by PCR 01/07/2021 NEGATIVE  NEGATIVE Final   Influenza B by PCR 01/07/2021 NEGATIVE  NEGATIVE Final   Comment: (NOTE) The Xpert Xpress SARS-CoV-2/FLU/RSV plus assay is intended as an aid in the diagnosis of influenza from Nasopharyngeal swab specimens and should not be used as a sole basis for treatment. Nasal washings and aspirates are unacceptable for Xpert Xpress SARS-CoV-2/FLU/RSV testing.  Fact Sheet for Patients: EntrepreneurPulse.com.au  Fact Sheet for Healthcare  Providers: IncredibleEmployment.be  This test is not yet approved or cleared by the [place] FDA and has been authorized for detection and/or diagnosis of SARS-CoV-2 by FDA under an Emergency Use Authorization (EUA). This EUA will remain in effect (meaning this test can be used) for the duration of the COVID-19 declaration under Section 564(b)(1) of the Act, 21 U.S.C. section 360bbb-3(b)(1), unless the authorization is terminated or revoked.  Performed at [facility], [place] [address], [place], [name] [address]    WBC 01/07/2021 10.5  4.0 - 10.5 K/uL Final   RBC 01/07/2021 5.19  4.22 - 5.81 MIL/uL Final   Hemoglobin 01/07/2021 15.4  13.0 - 17.0 g/dL Final   HCT 01/07/2021 47.4  39.0 - 52.0 % Final   MCV 01/07/2021 91.3  80.0 - 100.0 fL Final   MCH 01/07/2021 29.7  26.0 - 34.0 pg Final   MCHC 01/07/2021 32.5  30.0 - 36.0 g/dL Final   RDW 01/07/2021 13.1  11.5 - 15.5 % Final   Platelets 01/07/2021 193  150 - 400 K/uL Final   nRBC 01/07/2021 0.0  0.0 - 0.2 % Final   Neutrophils Relative % 01/07/2021 71  % Final   Neutro Abs 01/07/2021 7.4  1.7 - 7.7 K/uL Final   Lymphocytes Relative 01/07/2021 22  % Final   Lymphs Abs 01/07/2021  2.3  0.7 - 4.0 K/uL Final   Monocytes Relative 01/07/2021 7  % Final   Monocytes Absolute 01/07/2021 0.7  0.1 - 1.0 K/uL Final   Eosinophils Relative 01/07/2021 0  % Final   Eosinophils Absolute 01/07/2021 0.0  0.0 - 0.5 K/uL Final   Basophils Relative 01/07/2021 0  % Final   Basophils Absolute 01/07/2021 0.0  0.0 - 0.1 K/uL Final   Immature Granulocytes 01/07/2021 0  % Final   Abs Immature Granulocytes 01/07/2021 0.03  0.00 - 0.07 K/uL Final   Performed at [facility], [place] [address] [place], [name] [address]   Sodium 01/07/2021 133 (L)  135 - 145 mmol/L Final   Potassium 01/07/2021 4.1  3.5 - 5.1 mmol/L Final   Chloride 01/07/2021 98  98 - 111 mmol/L Final   CO2 01/07/2021 24  22 - 32 mmol/L Final   Glucose, Bld  01/07/2021 103 (H)  70 - 99 mg/dL Final   Glucose reference range applies only to samples taken after fasting for at least 8 hours.   BUN 01/07/2021 19  6 - 20 mg/dL Final   Creatinine, Ser 01/07/2021 1.26 (H)  0.61 - 1.24 mg/dL Final   Calcium 01/07/2021 9.9  8.9 - 10.3 mg/dL Final   Total Protein 01/07/2021 8.4 (H)  6.5 - 8.1 g/dL Final   Albumin 01/07/2021 4.8  3.5 - 5.0 g/dL Final   AST 01/07/2021 102 (H)  15 - 41 U/L Final   ALT 01/07/2021 57 (H)  0 - 44 U/L Final   Alkaline Phosphatase 01/07/2021 129 (H)  38 - 126 U/L Final   Total Bilirubin 01/07/2021 0.8  0.3 - 1.2 mg/dL Final   GFR, Estimated 01/07/2021 >60  >60 mL/min Final   Comment: (NOTE) Calculated using the CKD-EPI Creatinine Equation (2021)    Anion gap 01/07/2021 11  5 - 15 Final   Performed at [facility] [address], [place], [place] [address]   Alcohol, Ethyl (B) 01/07/2021 <10  <10 mg/dL Final   Comment: (NOTE) Lowest detectable limit for serum alcohol is 10 mg/dL.  For medical purposes only. Performed at [facility], [place] [address], [place], [place] [address]    Cholesterol 01/07/2021 266 (H)  0 - 200 mg/dL Final   Triglycerides 01/07/2021 81  <150 mg/dL Final   HDL 01/07/2021 59  >40 mg/dL Final   Total CHOL/HDL Ratio 01/07/2021 4.5  RATIO Final   VLDL 01/07/2021 16  0 - 40 mg/dL Final   LDL Cholesterol 01/07/2021 191 (H)  0 - 99 mg/dL Final   Comment:        Total Cholesterol/HDL:CHD Risk Coronary Heart Disease Risk Table                     Men   Women  1/2 Average Risk   3.4   3.3  Average Risk       5.0   4.4  2 X Average Risk   9.6   7.1  3 X Average Risk  23.4   11.0        Use the calculated Patient Ratio above and the CHD Risk Table to determine the patient's CHD Risk.        ATP III CLASSIFICATION (LDL):  <100     mg/dL   Optimal  100-129  mg/dL   Near or Above  Optimal  130-159  mg/dL   Borderline  160-189  mg/dL   High  >190     mg/dL   Very  High Performed at [facility] [address] [place], [name] [address]    POC Amphetamine UR 01/07/2021 None Detected  NONE DETECTED (Cut Off Level 1000 ng/mL) Final   POC Secobarbital (BAR) 01/07/2021 None Detected  NONE DETECTED (Cut Off Level 300 ng/mL) Final   POC Buprenorphine (BUP) 01/07/2021 None Detected  NONE DETECTED (Cut Off Level 10 ng/mL) Final   POC Oxazepam (BZO) 01/07/2021 None Detected  NONE DETECTED (Cut Off Level 300 ng/mL) Final   POC Cocaine UR 01/07/2021 Positive (A)  NONE DETECTED (Cut Off Level 300 ng/mL) Final   POC Methamphetamine UR 01/07/2021 None Detected  NONE DETECTED (Cut Off Level 1000 ng/mL) Final   POC Morphine 01/07/2021 None Detected  NONE DETECTED (Cut Off Level 300 ng/mL) Final   POC Oxycodone UR 01/07/2021 None Detected  NONE DETECTED (Cut Off Level 100 ng/mL) Final   POC Methadone UR 01/07/2021 None Detected  NONE DETECTED (Cut Off Level 300 ng/mL) Final   POC Marijuana UR 01/07/2021 None Detected  NONE DETECTED (Cut Off Level 50 ng/mL) Final   SARSCOV2ONAVIRUS 2 AG 01/07/2021 NEGATIVE  NEGATIVE Final   Comment: (NOTE) SARS-CoV-2 antigen NOT DETECTED.   Negative results are presumptive.  Negative results do not preclude SARS-CoV-2 infection and should not be used as the sole basis for treatment or other patient management decisions, including infection  control decisions, particularly in the presence of clinical signs and  symptoms consistent with COVID-19, or in those who have been in contact with the virus.  Negative results must be combined with clinical observations, patient history, and epidemiological information. The expected result is Negative.  Fact Sheet for Patients: HandmadeRecipes.com.cy  Fact Sheet for Healthcare Providers: FuneralLife.at  This test is not yet approved or cleared by the [place] FDA and  has been authorized for detection and/or diagnosis of SARS-CoV-2  by FDA under an Emergency Use Authorization (EUA).  This EUA will remain in effect (meaning this test can be used) for the duration of  the COV                          ID-19 declaration under Section 564(b)(1) of the Act, 21 U.S.C. section 360bbb-3(b)(1), unless the authorization is terminated or revoked sooner.     SARS Coronavirus 2 Ag 01/07/2021 Negative  Negative Preliminary   Sodium 01/08/2021 137  135 - 145 mmol/L Final   Potassium 01/08/2021 3.7  3.5 - 5.1 mmol/L Final   Chloride 01/08/2021 103  98 - 111 mmol/L Final   CO2 01/08/2021 23  22 - 32 mmol/L Final   Glucose, Bld 01/08/2021 123 (H)  70 - 99 mg/dL Final   Glucose reference range applies only to samples taken after fasting for at least 8 hours.   BUN 01/08/2021 22 (H)  6 - 20 mg/dL Final   Creatinine, Ser 01/08/2021 1.19  0.61 - 1.24 mg/dL Final   Calcium 01/08/2021 9.4  8.9 - 10.3 mg/dL Final   Total Protein 01/08/2021 7.2  6.5 - 8.1 g/dL Final   Albumin 01/08/2021 4.0  3.5 - 5.0 g/dL Final   AST 01/08/2021 109 (H)  15 - 41 U/L Final   ALT 01/08/2021 56 (H)  0 - 44 U/L Final   Alkaline Phosphatase 01/08/2021 105  38 - 126 U/L Final  Total Bilirubin 01/08/2021 0.9  0.3 - 1.2 mg/dL Final   GFR, Estimated 01/08/2021 >60  >60 mL/min Final   Comment: (NOTE) Calculated using the CKD-EPI Creatinine Equation (2021)    Anion gap 01/08/2021 11  5 - 15 Final   Performed at [facility], [place] [address], [place], [place] [address]  Appointment on 01/06/2021  Component Date Value Ref Range Status   CD4 T Cell Abs 01/06/2021 910  400 - 1,790 /uL Final   CD4 % Helper T Cell 01/06/2021 36  33 - 65 % Final   Performed at [facility], [place] [address], [place], [place] [address]   HIV 1 RNA Quant 01/06/2021 <20 (H)  Copies/mL Final   HIV-1 RNA Detected   HIV-1 RNA Quant, Log 01/06/2021 <1.30 (H)  Log cps/mL Final   Comment: HIV-1 RNA Detected . HIV-1 RNA was detected below 20 copies/mL. Viral  nucleic acid detected below this level cannot be quantified by the assay. . . Reference Range:                           Not Detected     copies/mL                           Not Detected Log copies/mL . Marland Kitchen The test was performed using Real-Time Polymerase Chain Reaction. . . Reportable Range: 20 copies/mL to 10,000,000 copies/mL (1.30 Log copies/mL to 7.00 Log copies/mL). .   Admission on 01/02/2021, Discharged on 01/03/2021  Component Date Value Ref Range Status   Lipase 01/03/2021 44  11 - 51 U/L Final   Performed at [facility], [place] [address] [place], [name] [address]   Sodium 01/03/2021 137  135 - 145 mmol/L Final   Potassium 01/03/2021 4.2  3.5 - 5.1 mmol/L Final   Chloride 01/03/2021 101  98 - 111 mmol/L Final   CO2 01/03/2021 30  22 - 32 mmol/L Final   Glucose, Bld 01/03/2021 123 (H)  70 - 99 mg/dL Final   Glucose reference range applies only to samples taken after fasting for at least 8 hours.   BUN 01/03/2021 12  6 - 20 mg/dL Final   Creatinine, Ser 01/03/2021 1.12  0.61 - 1.24 mg/dL Final   Calcium 01/03/2021 10.0  8.9 - 10.3 mg/dL Final   Total Protein 01/03/2021 7.6  6.5 - 8.1 g/dL Final   Albumin 01/03/2021 4.1  3.5 - 5.0 g/dL Final   AST 01/03/2021 27  15 - 41 U/L Final   ALT 01/03/2021 41  0 - 44 U/L Final   Alkaline Phosphatase 01/03/2021 110  38 - 126 U/L Final   Total Bilirubin 01/03/2021 0.5  0.3 - 1.2 mg/dL Final   GFR, Estimated 01/03/2021 >60  >60 mL/min Final   Comment: (NOTE) Calculated using the CKD-EPI Creatinine Equation (2021)    Anion gap 01/03/2021 6  5 - 15 Final   Performed at [facility] [address] [place], [name] [address]   WBC 01/03/2021 10.4  4.0 - 10.5 K/uL Final   RBC 01/03/2021 5.04  4.22 - 5.81 MIL/uL Final   Hemoglobin 01/03/2021 15.4  13.0 - 17.0 g/dL Final   HCT 01/03/2021 46.0  39.0 - 52.0 % Final   MCV 01/03/2021 91.3  80.0 - 100.0 fL Final   MCH 01/03/2021 30.6  26.0 - 34.0 pg Final  MCHC  01/03/2021 33.5  30.0 - 36.0 g/dL Final   RDW 01/03/2021 12.8  11.5 - 15.5 % Final   Platelets 01/03/2021 231  150 - 400 K/uL Final   nRBC 01/03/2021 0.0  0.0 - 0.2 % Final   Performed at [facility], [place] [address], [place], [name] [address]   Color, Urine 01/03/2021 YELLOW  YELLOW Final   APPearance 01/03/2021 HAZY (A)  CLEAR Final   Specific Gravity, Urine 01/03/2021 1.015  1.005 - 1.030 Final   pH 01/03/2021 8.0  5.0 - 8.0 Final   Glucose, UA 01/03/2021 NEGATIVE  NEGATIVE mg/dL Final   Hgb urine dipstick 01/03/2021 NEGATIVE  NEGATIVE Final   Bilirubin Urine 01/03/2021 NEGATIVE  NEGATIVE Final   Ketones, ur 01/03/2021 NEGATIVE  NEGATIVE mg/dL Final   Protein, ur 01/03/2021 NEGATIVE  NEGATIVE mg/dL Final   Nitrite 01/03/2021 NEGATIVE  NEGATIVE Final   Leukocytes,Ua 01/03/2021 NEGATIVE  NEGATIVE Final   Comment: Microscopic not done on urines with negative protein, blood, leukocytes, nitrite, or glucose < 500 mg/dL. Performed at [facility], [place] [address] [place], [place] [address]   Admission on 10/25/2020, Discharged on 10/31/2020  Component Date Value Ref Range Status   SARS Coronavirus 2 by RT PCR 10/26/2020 NEGATIVE  NEGATIVE Final   Comment: (NOTE) SARS-CoV-2 target nucleic acids are NOT DETECTED.  The SARS-CoV-2 RNA is generally detectable in upper respiratory specimens during the acute phase of infection. The lowest concentration of SARS-CoV-2 viral copies this assay can detect is 138 copies/mL. A negative result does not preclude SARS-Cov-2 infection and should not be used as the sole basis for treatment or other patient management decisions. A negative result may occur with  improper specimen collection/handling, submission of specimen other than nasopharyngeal swab, presence of viral mutation(s) within the areas targeted by this assay, and inadequate number of viral copies(<138 copies/mL). A negative result must be combined with clinical observations,  patient history, and epidemiological information. The expected result is Negative.  Fact Sheet for Patients:  EntrepreneurPulse.com.au  Fact Sheet for Healthcare Providers:  IncredibleEmployment.be  This test is no                          t yet approved or cleared by the [place] FDA and  has been authorized for detection and/or diagnosis of SARS-CoV-2 by FDA under an Emergency Use Authorization (EUA). This EUA will remain  in effect (meaning this test can be used) for the duration of the COVID-19 declaration under Section 564(b)(1) of the Act, 21 U.S.C.section 360bbb-3(b)(1), unless the authorization is terminated  or revoked sooner.       Influenza A by PCR 10/26/2020 NEGATIVE  NEGATIVE Final   Influenza B by PCR 10/26/2020 NEGATIVE  NEGATIVE Final   Comment: (NOTE) The Xpert Xpress SARS-CoV-2/FLU/RSV plus assay is intended as an aid in the diagnosis of influenza from Nasopharyngeal swab specimens and should not be used as a sole basis for treatment. Nasal washings and aspirates are unacceptable for Xpert Xpress SARS-CoV-2/FLU/RSV testing.  Fact Sheet for Patients: EntrepreneurPulse.com.au  Fact Sheet for Healthcare Providers: IncredibleEmployment.be  This test is not yet approved or cleared by the [place] FDA and has been authorized for detection and/or diagnosis of SARS-CoV-2 by FDA under an Emergency Use Authorization (EUA). This EUA will remain in effect (meaning this test can be used) for the duration of the COVID-19 declaration under Section 564(b)(1) of the Act, 21 U.S.C.  section 360bbb-3(b)(1), unless the authorization is terminated or revoked.  Performed at [facility], [place] [address], [place], [name] [address]    SARS Coronavirus 2 Ag 10/26/2020 Negative  Negative Preliminary   WBC 10/26/2020 7.7  4.0 - 10.5 K/uL Final   RBC 10/26/2020 4.58  4.22 - 5.81 MIL/uL Final    Hemoglobin 10/26/2020 14.1  13.0 - 17.0 g/dL Final   HCT 10/26/2020 41.4  39.0 - 52.0 % Final   MCV 10/26/2020 90.4  80.0 - 100.0 fL Final   MCH 10/26/2020 30.8  26.0 - 34.0 pg Final   MCHC 10/26/2020 34.1  30.0 - 36.0 g/dL Final   RDW 10/26/2020 12.6  11.5 - 15.5 % Final   Platelets 10/26/2020 218  150 - 400 K/uL Final   nRBC 10/26/2020 0.0  0.0 - 0.2 % Final   Neutrophils Relative % 10/26/2020 58  % Final   Neutro Abs 10/26/2020 4.4  1.7 - 7.7 K/uL Final   Lymphocytes Relative 10/26/2020 36  % Final   Lymphs Abs 10/26/2020 2.8  0.7 - 4.0 K/uL Final   Monocytes Relative 10/26/2020 5  % Final   Monocytes Absolute 10/26/2020 0.4  0.1 - 1.0 K/uL Final   Eosinophils Relative 10/26/2020 1  % Final   Eosinophils Absolute 10/26/2020 0.1  0.0 - 0.5 K/uL Final   Basophils Relative 10/26/2020 0  % Final   Basophils Absolute 10/26/2020 0.0  0.0 - 0.1 K/uL Final   Immature Granulocytes 10/26/2020 0  % Final   Abs Immature Granulocytes 10/26/2020 0.00  0.00 - 0.07 K/uL Final   Performed at [facility], [place] [address], [place], [name] [address]   Sodium 10/26/2020 136  135 - 145 mmol/L Final   Potassium 10/26/2020 3.8  3.5 - 5.1 mmol/L Final   Chloride 10/26/2020 105  98 - 111 mmol/L Final   CO2 10/26/2020 21 (L)  22 - 32 mmol/L Final   Glucose, Bld 10/26/2020 151 (H)  70 - 99 mg/dL Final   Glucose reference range applies only to samples taken after fasting for at least 8 hours.   BUN 10/26/2020 15  6 - 20 mg/dL Final   Creatinine, Ser 10/26/2020 1.16  0.61 - 1.24 mg/dL Final   Calcium 10/26/2020 9.7  8.9 - 10.3 mg/dL Final   Total Protein 10/26/2020 6.9  6.5 - 8.1 g/dL Final   Albumin 10/26/2020 4.1  3.5 - 5.0 g/dL Final   AST 10/26/2020 34  15 - 41 U/L Final   ALT 10/26/2020 38  0 - 44 U/L Final   Alkaline Phosphatase 10/26/2020 91  38 - 126 U/L Final   Total Bilirubin 10/26/2020 0.7  0.3 - 1.2 mg/dL Final   GFR, Estimated 10/26/2020 >60  >60 mL/min Final   Comment:  (NOTE) Calculated using the CKD-EPI Creatinine Equation (2021)    Anion gap 10/26/2020 10  5 - 15 Final   Performed at [place] [address], [place], [name] [address]   Hgb A1c MFr Bld 10/26/2020 6.0 (H)  4.8 - 5.6 % Final   Comment: (NOTE) Pre diabetes:          5.7%-6.4%  Diabetes:              >6.4%  Glycemic control for   <7.0% adults with diabetes    Mean Plasma Glucose 10/26/2020 125.5  mg/dL Final   Performed at [facility], [place] [address], [place], [place] [address]  Alcohol, Ethyl (B) 10/26/2020 <10  <10 mg/dL Final   Comment: (NOTE) Lowest detectable limit for serum alcohol is 10 mg/dL.  For medical purposes only. Performed at [facility], [place] [address], [place], [place] [address]    Cholesterol 10/26/2020 153  0 - 200 mg/dL Final   Triglycerides 10/26/2020 80  <150 mg/dL Final   HDL 10/26/2020 43  >40 mg/dL Final   Total CHOL/HDL Ratio 10/26/2020 3.6  RATIO Final   VLDL 10/26/2020 16  0 - 40 mg/dL Final   LDL Cholesterol 10/26/2020 94  0 - 99 mg/dL Final   Comment:        Total Cholesterol/HDL:CHD Risk Coronary Heart Disease Risk Table                     Men   Women  1/2 Average Risk   3.4   3.3  Average Risk       5.0   4.4  2 X Average Risk   9.6   7.1  3 X Average Risk  23.4   11.0        Use the calculated Patient Ratio above and the CHD Risk Table to determine the patient's CHD Risk.        ATP III CLASSIFICATION (LDL):  <100     mg/dL   Optimal  100-129  mg/dL   Near or Above                    Optimal  130-159  mg/dL   Borderline  160-189  mg/dL   High  >190     mg/dL   Very High Performed at [place] [address], [place], [place] [address]    TSH 10/26/2020 0.512  0.350 - 4.500 uIU/mL Final   Comment: Performed by a 3rd Generation assay with a functional sensitivity of <=0.01 uIU/mL. Performed at [facility], [place] [address] [place], [name] [address]    POC Amphetamine UR 10/26/2020 None  Detected  NONE DETECTED (Cut Off Level 1000 ng/mL) Final   POC Secobarbital (BAR) 10/26/2020 None Detected  NONE DETECTED (Cut Off Level 300 ng/mL) Final   POC Buprenorphine (BUP) 10/26/2020 None Detected  NONE DETECTED (Cut Off Level 10 ng/mL) Final   POC Oxazepam (BZO) 10/26/2020 Positive (A)  NONE DETECTED (Cut Off Level 300 ng/mL) Final   POC Cocaine UR 10/26/2020 Positive (A)  NONE DETECTED (Cut Off Level 300 ng/mL) Final   POC Methamphetamine UR 10/26/2020 None Detected  NONE DETECTED (Cut Off Level 1000 ng/mL) Final   POC Morphine 10/26/2020 None Detected  NONE DETECTED (Cut Off Level 300 ng/mL) Final   POC Oxycodone UR 10/26/2020 None Detected  NONE DETECTED (Cut Off Level 100 ng/mL) Final   POC Methadone UR 10/26/2020 None Detected  NONE DETECTED (Cut Off Level 300 ng/mL) Final   POC Marijuana UR 10/26/2020 None Detected  NONE DETECTED (Cut Off Level 50 ng/mL) Final   Group A Strep by PCR 10/30/2020 NOT DETECTED  NOT DETECTED Final   Performed at [facility], [place] [address], [place], [place] [address]  Admission on 08/24/2020, Discharged on 08/24/2020  Component Date Value Ref Range Status   WBC 08/24/2020 11.5 (H)  4.0 - 10.5 K/uL Final   RBC 08/24/2020 4.93  4.22 - 5.81 MIL/uL Final   Hemoglobin 08/24/2020 15.0  13.0 - 17.0 g/dL Final   HCT 08/24/2020 46.0  39.0 - 52.0 %  Final   MCV 08/24/2020 93.3  80.0 - 100.0 fL Final   MCH 08/24/2020 30.4  26.0 - 34.0 pg Final   MCHC 08/24/2020 32.6  30.0 - 36.0 g/dL Final   RDW 08/24/2020 13.0  11.5 - 15.5 % Final   Platelets 08/24/2020 217  150 - 400 K/uL Final   nRBC 08/24/2020 0.0  0.0 - 0.2 % Final   Neutrophils Relative % 08/24/2020 73  % Final   Neutro Abs 08/24/2020 8.3 (H)  1.7 - 7.7 K/uL Final   Lymphocytes Relative 08/24/2020 20  % Final   Lymphs Abs 08/24/2020 2.3  0.7 - 4.0 K/uL Final   Monocytes Relative 08/24/2020 7  % Final   Monocytes Absolute 08/24/2020 0.8  0.1 - 1.0 K/uL Final   Eosinophils Relative 08/24/2020 0   % Final   Eosinophils Absolute 08/24/2020 0.0  0.0 - 0.5 K/uL Final   Basophils Relative 08/24/2020 0  % Final   Basophils Absolute 08/24/2020 0.0  0.0 - 0.1 K/uL Final   Immature Granulocytes 08/24/2020 0  % Final   Abs Immature Granulocytes 08/24/2020 0.04  0.00 - 0.07 K/uL Final   Performed at [facility], [place] [address] [place], [name] [address]   Sodium 08/24/2020 138  135 - 145 mmol/L Final   Potassium 08/24/2020 4.3  3.5 - 5.1 mmol/L Final   Chloride 08/24/2020 103  98 - 111 mmol/L Final   CO2 08/24/2020 26  22 - 32 mmol/L Final   Glucose, Bld 08/24/2020 88  70 - 99 mg/dL Final   Glucose reference range applies only to samples taken after fasting for at least 8 hours.   BUN 08/24/2020 15  6 - 20 mg/dL Final   Creatinine, Ser 08/24/2020 1.21  0.61 - 1.24 mg/dL Final   Calcium 08/24/2020 9.8  8.9 - 10.3 mg/dL Final   Total Protein 08/24/2020 8.7 (H)  6.5 - 8.1 g/dL Final   Albumin 08/24/2020 4.8  3.5 - 5.0 g/dL Final   AST 08/24/2020 49 (H)  15 - 41 U/L Final   ALT 08/24/2020 47 (H)  0 - 44 U/L Final   Alkaline Phosphatase 08/24/2020 90  38 - 126 U/L Final   Total Bilirubin 08/24/2020 0.7  0.3 - 1.2 mg/dL Final   GFR, Estimated 08/24/2020 >60  >60 mL/min Final   Comment: (NOTE) Calculated using the CKD-EPI Creatinine Equation (2021)    Anion gap 08/24/2020 9  5 - 15 Final   Performed at [facility], [place] [address], [place], [place] [address]   Alcohol, Ethyl (B) 08/24/2020 <10  <10 mg/dL Final   Comment: (NOTE) Lowest detectable limit for serum alcohol is 10 mg/dL.  For medical purposes only. Performed at [facility], [place] [address] [place], [name] [address]    Acetaminophen (Tylenol), Serum 08/24/2020 <10 (L)  10 - 30 ug/mL Final   Comment: (NOTE) Therapeutic concentrations vary significantly. A range of 10-30 ug/mL  may be an effective concentration for many patients. However, some  are best treated at  concentrations outside of this range. Acetaminophen concentrations >150 ug/mL at 4 hours after ingestion  and >50 ug/mL at 12 hours after ingestion are often associated with  toxic reactions.  Performed at [facility], [place] [address], [place], [name] [address]    Troponin I (High Sensitivity) 08/24/2020 5  <18 ng/L Final   Comment: (NOTE) Elevated high sensitivity troponin I (hsTnI) values and significant  changes across serial  measurements may suggest ACS but many other  chronic and acute conditions are known to elevate hsTnI results.  Refer to the "Links" section for chest pain algorithms and additional  guidance. Performed at [facility], [place] [address] [place], [place] [address]    SARS Coronavirus 2 by RT PCR 08/24/2020 NEGATIVE  NEGATIVE Final   Comment: (NOTE) SARS-CoV-2 target nucleic acids are NOT DETECTED.  The SARS-CoV-2 RNA is generally detectable in upper respiratory specimens during the acute phase of infection. The lowest concentration of SARS-CoV-2 viral copies this assay can detect is 138 copies/mL. A negative result does not preclude SARS-Cov-2 infection and should not be used as the sole basis for treatment or other patient management decisions. A negative result may occur with  improper specimen collection/handling, submission of specimen other than nasopharyngeal swab, presence of viral mutation(s) within the areas targeted by this assay, and inadequate number of viral copies(<138 copies/mL). A negative result must be combined with clinical observations, patient history, and epidemiological information. The expected result is Negative.  Fact Sheet for Patients:  EntrepreneurPulse.com.au  Fact Sheet for Healthcare Providers:  IncredibleEmployment.be  This test is no                          t yet approved or cleared by the [place] FDA and  has been authorized for detection  and/or diagnosis of SARS-CoV-2 by FDA under an Emergency Use Authorization (EUA). This EUA will remain  in effect (meaning this test can be used) for the duration of the COVID-19 declaration under Section 564(b)(1) of the Act, 21 U.S.C.section 360bbb-3(b)(1), unless the authorization is terminated  or revoked sooner.       Influenza A by PCR 08/24/2020 NEGATIVE  NEGATIVE Final   Influenza B by PCR 08/24/2020 NEGATIVE  NEGATIVE Final   Comment: (NOTE) The Xpert Xpress SARS-CoV-2/FLU/RSV plus assay is intended as an aid in the diagnosis of influenza from Nasopharyngeal swab specimens and should not be used as a sole basis for treatment. Nasal washings and aspirates are unacceptable for Xpert Xpress SARS-CoV-2/FLU/RSV testing.  Fact Sheet for Patients: EntrepreneurPulse.com.au  Fact Sheet for Healthcare Providers: IncredibleEmployment.be  This test is not yet approved or cleared by the [place] FDA and has been authorized for detection and/or diagnosis of SARS-CoV-2 by FDA under an Emergency Use Authorization (EUA). This EUA will remain in effect (meaning this test can be used) for the duration of the COVID-19 declaration under Section 564(b)(1) of the Act, 21 U.S.C. section 360bbb-3(b)(1), unless the authorization is terminated or revoked.  Performed at [facility], [place] [address] [place], [place] [address]   Office Visit on 07/14/2020  Component Date Value Ref Range Status   Vitamin B-12 07/14/2020 403  200 - 1,100 pg/mL Final   Sed Rate 07/14/2020 2  0 - 20 mm/h Final   T3 Uptake 07/14/2020 29  22 - 35 % Final   T4, Total 07/14/2020 6.4  4.9 - 10.5 mcg/dL Final   Free Thyroxine Index 07/14/2020 1.9  1.4 - 3.8 Final   TSH 07/14/2020 1.59  0.40 - 4.50 mIU/L Final    Blood Alcohol level:  Lab Results  Component Value Date   [facility] <10 01/07/2021   ETH <10 81/27/5170    Metabolic Disorder Labs: Lab Results   Component Value Date   HGBA1C 6.0 (H) 10/26/2020   MPG 125.5 10/26/2020   MPG 131.24 04/28/2020   Lab Results  Component Value Date   PROLACTIN 12.1 03/08/2020   PROLACTIN 2.6 (L) 11/09/2019   Lab Results  Component Value Date   CHOL 266 (H) 01/07/2021   TRIG 81 01/07/2021   HDL 59 01/07/2021   CHOLHDL 4.5 01/07/2021   VLDL 16 01/07/2021   LDLCALC 191 (H) 01/07/2021   LDLCALC 94 10/26/2020    Therapeutic Lab Levels: No results found for: LITHIUM No results found for: VALPROATE No components found for:  CBMZ  Physical Findings   AIMS    Flowsheet Row Admission (Discharged) from 04/28/2020 in [place] Admission (Discharged) from 03/07/2020 in [facility]  AIMS Total Score 0 0      AUDIT    Flowsheet Row Admission (Discharged) from 04/28/2020 in [place] Admission (Discharged) from 03/07/2020 in [facility] [facility] Admission (Discharged) from 12/12/2011 in [place] [facility]  Alcohol Use Disorder Identification Test Final Score (AUDIT) 3 13 8       GAD-7    Flowsheet Row Office Visit from 03/17/2020 in [place] Office Visit from 07/22/2019 in [facility] Office Visit from 09/03/2018 in [facility]  Total GAD-7 Score 16 19 19       PHQ2-9    [place] ED from 10/25/2020 in [facility] Video Visit from 04/05/2020 in [facility] Office Visit from 03/17/2020 in [place] ED from 11/09/2019 in [facility] Office Visit from 07/22/2019 in [place]  PHQ-2 Total Score 5 0 1 6 4   PHQ-9 Total Score 15 -- 9 19 15       Flowsheet Row ED from 01/07/2021 in [facility] ED  from 01/02/2021 in [place] ED from 10/25/2020 in [place] High Risk No Risk Low Risk        Musculoskeletal  Strength & Muscle Tone:  did not formally assess; observed patient going from laying in bed to sitting up in bed without issue Gait & Station:  did not assess; patient laying in bed during interview Patient leans: N/A  Psychiatric Specialty Exam  Presentation  General Appearance: Appropriate for Environment; Casual  Eye Contact:Fair  Speech:Clear and Coherent; Normal Rate  Speech Volume:Normal  Handedness:Right   Mood and Affect  Mood:Dysphoric  Affect:Appropriate; Congruent   Thought Process  Thought Processes:Coherent; Goal Directed; Linear  Descriptions of Associations:Intact  Orientation:Full (Time, Place and Person)  Thought Content:Logical; Rumination (ruminating on difficult living situation)  Diagnosis of Schizophrenia or Schizoaffective disorder in past: Yes  Duration of Psychotic Symptoms: Greater than six months   Hallucinations:Hallucinations: None (denies current; states that he last heard voices prior to admission to the [facility]) Description of Auditory Hallucinations: People he knows saying unkind things  Ideas of Reference:None  Suicidal Thoughts:Suicidal Thoughts: Yes, Passive SI Passive Intent and/or Plan: Without Plan  Homicidal Thoughts:Homicidal Thoughts: Yes, Passive (towards drug dealer who is living in his apartment illegally) HI Passive Intent and/or Plan: Without Intent; Without Plan   Sensorium  Memory:Recent Good; Immediate Good; Remote Fair  Judgment:Other (comment) (improving)  Insight:Fair   Executive Functions  Concentration:Fair  Attention Span:Fair  [place] of Knowledge:Good  Language:Good   Psychomotor Activity  Psychomotor Activity:Psychomotor Activity: Normal   Assets  Assets:Communication  Skills; Desire for Improvement; Resilience; Social Support; Housing   Sleep  Sleep:Sleep: Fair   No data recorded  Physical Exam  Physical Exam Constitutional:      Appearance: Normal appearance. He is normal weight.  HENT:     Head: Normocephalic and atraumatic.  Eyes:     Extraocular Movements: Extraocular movements intact.  Pulmonary:     Effort: Pulmonary effort is normal.  Neurological:     General: No focal deficit present.     Mental Status: He is alert and oriented to person, place, and time.  Psychiatric:        Attention and Perception: Attention and perception normal.        Speech: Speech normal.        Behavior: Behavior normal. Behavior is cooperative.        Thought Content: Thought content normal.   Review of Systems  Constitutional:  Negative for chills and fever.  HENT:  Negative for hearing loss.   Eyes:  Negative for discharge and redness.  Respiratory:  Negative for cough.   Cardiovascular:  Negative for chest pain.  Gastrointestinal:  Negative for abdominal pain.  Musculoskeletal:  Negative for myalgias.  Neurological:  Negative for headaches.  Psychiatric/Behavioral:  Positive for depression, substance abuse and suicidal ideas. Negative for hallucinations.        Passive SI without plan or intent   Blood pressure 92/65, pulse 67, temperature (!) 97.1 F (36.2 C), temperature source Temporal, resp. rate 16, SpO2 98 %. There is no height or weight on file to calculate BMI.  Treatment Plan Summary:  56 year old man with a history of schizoaffective disorder, depressive type and substance use who presented to the [facility] for suicidal thoughts and plan to jump from a parking deck on 01/07/21 and was admitted to the [facility].  UDS+cocaine. Patient was restarted on his home medications upon admission to the [facility] which were continued at the [facility].    Today patient reports passive SI without plan and passive HI without a plan directed towards the individual who has  been reportedly illegally staying in his apartment doing drugs . Patient tolerating increased cymbalta dose well and expresses interest in residential rehab. SW to assist. Patient remains appropriate for continued treatment at the [facility].    Schizoaffective disorder, depressive type -continue seroquel 250 mg whs -continue cymbalta 90 mg daily (increased on 1/2) -cotninue trazodone 150 gm qhs prn   Polysubstance abuse -UDS+cocaine -pt expresses interest in residential substance use treatment. SW to assist   Neuropathy -continue home gabapentin 600 mg qam/900 mg qhs -cymbalta also has pain properties to assist with neuropathic pain   HIV -continue biktarvy   HLD -continue crestor 20 mg      Dispo: Ongoing.  SW assisting-pt interested in residential rehab. Will need referral to Dr. Sharol Given prior to dc.   Ival Bible, MD 01/10/2021 1:17 PM

## 2021-01-11 DIAGNOSIS — R45851 Suicidal ideations: Secondary | ICD-10-CM | POA: Diagnosis not present

## 2021-01-11 DIAGNOSIS — Z20822 Contact with and (suspected) exposure to covid-19: Secondary | ICD-10-CM | POA: Diagnosis not present

## 2021-01-11 DIAGNOSIS — F251 Schizoaffective disorder, depressive type: Secondary | ICD-10-CM | POA: Diagnosis not present

## 2021-01-11 MED ORDER — DULOXETINE HCL 30 MG PO CPEP
90.0000 mg | ORAL_CAPSULE | Freq: Every day | ORAL | 1 refills | Status: DC
Start: 2021-01-12 — End: 2021-03-13

## 2021-01-11 MED ORDER — TRAZODONE HCL 150 MG PO TABS: 150.0000 mg | ORAL_TABLET | Freq: Every day | ORAL | 1 refills | Status: DC

## 2021-01-11 MED ORDER — PANTOPRAZOLE SODIUM 20 MG PO TBEC: 20.0000 mg | DELAYED_RELEASE_TABLET | Freq: Every day | ORAL | 1 refills | Status: DC

## 2021-01-11 MED ORDER — GABAPENTIN 300 MG PO CAPS: 900.0000 mg | ORAL_CAPSULE | Freq: Every day | ORAL | 1 refills | Status: DC

## 2021-01-11 MED ORDER — QUETIAPINE FUMARATE 50 MG PO TABS: 250.0000 mg | ORAL_TABLET | Freq: Every day | ORAL | 1 refills | Status: DC

## 2021-01-11 MED ORDER — ROSUVASTATIN CALCIUM 20 MG PO TABS: 20.0000 mg | ORAL_TABLET | Freq: Every day | ORAL | 1 refills | Status: DC

## 2021-01-11 MED ORDER — GABAPENTIN 300 MG PO CAPS: 600.0000 mg | ORAL_CAPSULE | Freq: Every morning | ORAL | 1 refills | Status: DC

## 2021-01-11 MED ORDER — HYDROXYZINE HCL 25 MG PO TABS: 25.0000 mg | ORAL_TABLET | Freq: Three times a day (TID) | ORAL | 1 refills | Status: DC | PRN

## 2021-01-11 MED ORDER — BIKTARVY 50-200-25 MG PO TABS: 1.0000 | ORAL_TABLET | Freq: Every day | ORAL | 1 refills | Status: DC

## 2021-01-11 MED ORDER — NICOTINE POLACRILEX 2 MG MT GUM: 2.0000 mg | CHEWING_GUM | Freq: Three times a day (TID) | OROMUCOSAL | 1 refills | Status: DC | PRN

## 2021-01-11 NOTE — ED Provider Notes (Addendum)
FBC/OBS ASAP Discharge Summary  Date and Time: 01/11/2021 1:12 PM  Name: Dylan [name]  MRN:  [id number]   Discharge Diagnoses:  Final diagnoses:  Schizoaffective disorder, depressive type ([place])  Suicidal ideation    Subjective:   Patient seen and chart reviewed.  Patient denied SI/HI/AVH.  He reported sleeping well last night and reports having a good appetite.  Discussed with patient that he was accepted to [facility] for substance use treatment tomorrow 1/4.  Patient verbalized understanding but requested that he be discharged today so that he could gather clothes and personal belongings that he could bring with him to the treatment facility.  Patient continued to express concerns over the individual who is reportedly residing in his apartment illegally.  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  Patient verbalized understanding and indicated that is what he was going to do so.  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  Recommended that patient involve law enforcement for the removal of this individual and ,if necessary, to obtain a restraining order on this individual if he felt threatened.  Patient verbalized understanding.  Recommended that patient remain in the [facility] to allow for transportation [facility]; however patient declined citing that he does not have anyone who could bring him personal items once he arrives there and would like to collect these items from his home and present tomorrow morning on his own to [facility].  Information was provided to patient prior to discharge about transportation services that could assist him with getting to day [facility] tomorrow  morning for substance use treatment.  Stay Summary:  56 year old male with psychiatric history of schizoaffective disorder depressive type, generalized anxiety, suicidal ideation substance abuse, and substance-induced mood disorder.  Patient presented voluntarily to [facility] via law enforcement on 01/07/21. With worsening depression and suicidal ideation with a plan to jump off a parking garage. Patient was admitted to the [facility] for continued treatment.  Upon admission, patient was restarted on his reported home medications of Biktarvy, duloxetine 60 mg daily, gabapentin 600 mg every morning/gabapentin 900 mg nightly, quetiapine 250 mg nightly, rosuvastatin 20 mg daily, trazodone 150 mg daily.  Duloxetine was increased to 90 mg on 01/10/21 for continued symptoms of low mood. Patient tolerated increased dose without se/ae. See above for interview date of discharge-patient denied SI/HI/AVH and requested discharge home so that he could gather his belongings and present today [facility] tomorrow morning 1/4 with is personal items.  Patient was discharged with 14-day samples as well as 30-day prescription with 1 refill.  .  Emotional and mental status and improvement in these areas  was monitored by daily by  clinical staff.  Patient was appropriate with staff and peers on the unit throughout his stay and was medication compliant.   Upon completion of this admission the [facility] was both mentally and medically stable for discharge denying suicidal/homicidal ideation, symptoms that would be consistent with psychosis (AVH, IOR, paranoia, etc).   On my interview today, day of discharge, , patient is in NAD, alert, oriented, calm, cooperative, and attentive, with normal affect, speech, and behavior. Objectively, there is no evidence of psychosis/ mania (able to converse coherently,  linear and goal directed thought, no RIS, no distractibility, not pre-occupied, no FOI, etc) nor depression to the point of suicidality (able to  concentrate, affect full and reactive, speech normal r/v/t, no psychomotor retardation/agitation, etc).  Overall, patient appears to be at the point, in the absence of inhibiting or disinhibiting symptoms, where he can successfully move to lesser restrictive setting for care.    Total Time spent with patient: 20 minutes  Past Psychiatric History: affective disorder, substance use Past Medical History:  Past Medical History:  Diagnosis Date   Anxiety    Arthritis    Bipolar 1 disorder ([place])    Colon polyps    Depression    GERD (gastroesophageal reflux disease)    Hepatitis B    Human immunodeficiency virus (HIV) ([place])    Hyperlipidemia    Hypertension    Neuromuscular disorder ([place])    neuropathy   Neuropathy    Pre-diabetes    Schizophrenia ([place])     Past Surgical History:  Procedure Laterality Date   FOOT ARTHRODESIS Right 09/17/2019   Procedure: FUSION RIGHT LISFRANC JOINT;  Surgeon: Newt Minion, MD;  Location: [place];  Service: Orthopedics;  Laterality: Right;   FOOT ARTHRODESIS Right 09/2019   HEMORROIDECTOMY     IR FLUORO GUIDE CV LINE RIGHT  02/22/2017   IR US GUIDE VASC ACCESS RIGHT  02/22/2017   TUMOR REMOVAL     From Chest   Family History:  Family History  Problem Relation Age of Onset   CAD Mother    Diabetes Father    Colon cancer Maternal Aunt 60   Diabetes Maternal Aunt    Heart disease Maternal Uncle    Esophageal cancer Neg Hx    Rectal cancer Neg Hx    Stomach cancer Neg Hx    Family Psychiatric History: see H&P Social History:  Social History   Substance and Sexual Activity  Alcohol Use Yes   Comment: occassional use on weekends     Social History   Substance and Sexual Activity  Drug Use Yes   Types: Cocaine, Marijuana   Comment: last use was Friday 03/05/2020    Social History   Socioeconomic History   Marital status: Married    Spouse name: Not on file   Number of children: 1   Years of education: Not on file   Highest  education level: Not on file  Occupational History   Not on file  Tobacco Use   Smoking status: Every Day    Packs/day: 0.50    Years: 15.00    Pack years: 7.50    Types: E-cigarettes, Cigarettes   Smokeless tobacco: Never   Tobacco comments:    has patches when he is ready to quit  Vaping Use   Vaping Use: Some days   Substances: Nicotine  Substance and Sexual Activity   Alcohol use: Yes    Comment: occassional use on weekends   Drug use: Yes    Types: Cocaine, Marijuana    Comment: last use was Friday 03/05/2020   Sexual activity: Yes    Partners: Female    Birth control/protection: Condom    Comment: condoms given  Other Topics Concern   Not on file  Social History Narrative   Not on file   Social Determinants of Health   Financial Resource Strain: Not on file  Food Insecurity: Not on file  Transportation Needs: Not on file  Physical Activity: Not on file  Stress: Not on file  Social Connections: Not on file   SDOH:  SDOH Screenings   Alcohol Screen: Low Risk    Last Alcohol Screening Score (AUDIT): 3  Depression (PHQ2-9): Medium Risk   PHQ-2 Score: 15  Financial Resource Strain: Not on file  Food Insecurity: Not on file  Housing: Not on file  Physical Activity: Not on file  Social Connections: Not on file  Stress: Not on file  Tobacco Use: High Risk   Smoking Tobacco Use: Every Day   Smokeless Tobacco Use: Never   Passive Exposure: Not on file  Transportation Needs: Not on file    Tobacco Cessation:  A prescription for an FDA-approved tobacco cessation medication provided at discharge  Current Medications:  Current Facility-Administered Medications  Medication Dose Route Frequency Provider Last Rate Last Admin   acetaminophen (TYLENOL) tablet 650 mg  650 mg Oral Q6H PRN Ajibola, Ene A, NP       alum & mag hydroxide-simeth (MAALOX/MYLANTA) 200-200-20 MG/5ML suspension 30 mL  30 mL Oral Q4H PRN Ajibola, Ene A, NP        bictegravir-emtricitabine-tenofovir AF (BIKTARVY) 50-200-25 MG per tablet 1 tablet  1 tablet Oral Daily Ajibola, Ene A, NP   1 tablet at 01/11/21 1036   DULoxetine (CYMBALTA) DR capsule 90 mg  90 mg Oral Daily Cinderella, Margaret A   90 mg at 01/11/21 1035   gabapentin (NEURONTIN) capsule 600 mg  600 mg Oral q AM Ajibola, Ene A, NP   600 mg at 01/11/21 1036   gabapentin (NEURONTIN) capsule 900 mg  900 mg Oral QHS Ajibola, Ene A, NP   900 mg at 01/10/21 2138   hydrOXYzine (ATARAX) tablet 25 mg  25 mg Oral TID PRN Ajibola, Ene A, NP       magnesium hydroxide (MILK OF MAGNESIA) suspension 30 mL  30 mL Oral Daily PRN Ajibola, Ene A, NP       nicotine polacrilex (NICORETTE) gum 2 mg  2 mg Oral TID PRN Cinderella, Margaret A   2 mg at 01/10/21 1132   pantoprazole (PROTONIX) EC tablet 20 mg  20 mg Oral Daily Lindon Romp A, NP   20 mg at 01/11/21 1036   QUEtiapine (SEROQUEL) tablet 250 mg  250 mg Oral q1800 Cinderella, Margaret A   250 mg at 01/10/21 1912   rosuvastatin (CRESTOR) tablet 20 mg  20 mg Oral Daily Ajibola, Ene A, NP   20 mg at 01/11/21 1035   traZODone (DESYREL) tablet 150 mg  150 mg Oral QHS Ajibola, Ene A, NP   150 mg at 01/10/21 2137   Current Outpatient Medications  Medication Sig Dispense Refill   [START ON 01/12/2021] bictegravir-emtricitabine-tenofovir AF (BIKTARVY) 50-200-25 MG TABS tablet Take 1 tablet by mouth daily. 30 tablet 1   [START ON 01/12/2021] DULoxetine (CYMBALTA) 30 MG capsule Take 3 capsules (90 mg total) by mouth daily. 90 capsule 1   [START ON 01/12/2021] gabapentin (NEURONTIN) 300 MG capsule Take 2 capsules (600 mg total) by mouth in the morning. 30 capsule 1   gabapentin (NEURONTIN) 300 MG capsule Take 3 capsules (900 mg total) by mouth at bedtime. 30 capsule 1   hydrOXYzine (ATARAX) 25 MG tablet Take 1 tablet (25 mg total) by mouth 3 (three) times daily as needed for anxiety. 60 tablet 1   nicotine polacrilex (NICORETTE) 2 MG gum Take 1 each (2 mg total) by mouth 3  (three) times daily as needed for smoking cessation. 100 tablet 1   [START ON 01/12/2021] pantoprazole (  PROTONIX) 20 MG tablet Take 1 tablet (20 mg total) by mouth daily. 30 tablet 1   QUEtiapine (SEROQUEL) 50 MG tablet Take 5 tablets (250 mg total) by mouth daily at 6 PM. 150 tablet 1   rosuvastatin (CRESTOR) 20 MG tablet Take 1 tablet (20 mg total) by mouth daily. 30 tablet 1   traZODone (DESYREL) 150 MG tablet Take 1 tablet (150 mg total) by mouth at bedtime. 30 tablet 1    PTA Medications: (Not in a hospital admission)   Musculoskeletal  Strength & Muscle Tone: within normal limits Gait & Station:  slow, with limp but ambulatory  Patient leans: N/A  Psychiatric Specialty Exam  Presentation  General Appearance: Appropriate for Environment; Casual  Eye Contact:Good  Speech:Clear and Coherent; Normal Rate  Speech Volume:Normal  Handedness:Right   Mood and Affect  Mood:Euthymic ("alright")  Affect:Appropriate; Congruent   Thought Process  Thought Processes:Coherent; Goal Directed; Linear  Descriptions of Associations:Intact  Orientation:Full (Time, Place and Person)  Thought Content:WDL; Logical; Rumination (ruminative on difficult living situation)  Diagnosis of Schizophrenia or Schizoaffective disorder in past: Yes  Duration of Psychotic Symptoms: Greater than six months   Hallucinations:Hallucinations: None  Ideas of Reference:None  Suicidal Thoughts:Suicidal Thoughts: No  Homicidal Thoughts:Homicidal Thoughts: No HI Passive Intent and/or Plan: Without Intent; Without Plan   Sensorium  Memory:Immediate Good; Remote Good; Recent Good  Judgment:Fair  Insight:Fair   Executive Functions  Concentration:Good  Attention Span:Good  Recall:Good  Fund of Knowledge:Good  Language:Good   Psychomotor Activity  Psychomotor Activity:Psychomotor Activity: Normal   Assets  Assets:Communication Skills; Desire for Improvement; Resilience; Social Support;  Housing   Sleep  Sleep:Sleep: Good   No data recorded  Physical Exam  Physical Exam Constitutional:      Appearance: Normal appearance. He is normal weight.  HENT:     Head: Normocephalic and atraumatic.  Eyes:     Extraocular Movements: Extraocular movements intact.  Pulmonary:     Effort: Pulmonary effort is normal.  Neurological:     General: No focal deficit present.     Mental Status: He is alert and oriented to person, place, and time.  Psychiatric:        Attention and Perception: Attention and perception normal.        Speech: Speech normal.        Behavior: Behavior normal. Behavior is cooperative.        Thought Content: Thought content normal.   Review of Systems  Constitutional:  Negative for chills and fever.  HENT:  Negative for hearing loss.   Eyes:  Negative for discharge and redness.  Respiratory:  Negative for cough.   Cardiovascular:  Negative for chest pain.  Gastrointestinal:  Negative for abdominal pain.  Musculoskeletal:  Negative for myalgias.  Neurological:  Negative for headaches.  Psychiatric/Behavioral:  Positive for substance abuse. Negative for depression, hallucinations and suicidal ideas.   Blood pressure 108/78, pulse 65, temperature 98.7 F (37.1 C), temperature source Oral, resp. rate 16, SpO2 100 %. There is no height or weight on file to calculate BMI.  Demographic Factors:  Male, Low socioeconomic status, and Living alone  Loss Factors: Legal issues and Financial problems/change in socioeconomic status  Historical Factors: Impulsivity  Risk Reduction Factors:   Living with another person, especially a relative, Positive social support, and Positive coping skills or problem solving skills  Continued Clinical Symptoms:  Depression:   Comorbid alcohol abuse/dependence Alcohol/Substance Abuse/Dependencies  Cognitive Features That Contribute To Risk:  None  Suicide Risk:  Minimal: No identifiable suicidal ideation.   Patients presenting with no risk factors but with morbid ruminations; may be classified as minimal risk based on the severity of the depressive symptoms  Plan Of Care/Follow-up recommendations:  Activity:  as tolerated Diet:  regular Other:    Take all medications as prescribed by his/her mental healthcare provider. Report any adverse effects and or reactions from the medicines to your outpatient provider promptly. Do not engage in alcohol and or illegal drug use while on prescription medicines. In the event of worsening symptoms, call the crisis hotline, 911 and or go to the nearest ED for appropriate evaluation and treatment of symptoms. follow-up with your primary care provider for your other medical issues, concerns and or health care needs.  Allergies as of 01/11/2021       Reactions   Penicillins Other (See Comments)   Childhood allergy   Latex Rash   Tape Rash        Medication List     STOP taking these medications    meloxicam 7.5 MG tablet Commonly known as: Mobic       TAKE these medications    Biktarvy 50-200-25 MG Tabs tablet Generic drug: bictegravir-emtricitabine-tenofovir AF Take 1 tablet by mouth daily. Start taking on: January 12, 2021   DULoxetine 30 MG capsule Commonly known as: CYMBALTA Take 3 capsules (90 mg total) by mouth daily. Start taking on: January 12, 2021 What changed:  medication strength how much to take additional instructions   gabapentin 300 MG capsule Commonly known as: NEURONTIN Take 3 capsules (900 mg total) by mouth at bedtime. What changed:  how much to take when to take this   gabapentin 300 MG capsule Commonly known as: NEURONTIN Take 2 capsules (600 mg total) by mouth in the morning. Start taking on: January 12, 2021 What changed:  how much to take how to take this when to take this additional instructions   hydrOXYzine 25 MG tablet Commonly known as: ATARAX Take 1 tablet (25 mg total) by mouth 3 (three) times  daily as needed for anxiety.   nicotine polacrilex 2 MG gum Commonly known as: NICORETTE Take 1 each (2 mg total) by mouth 3 (three) times daily as needed for smoking cessation.   pantoprazole 20 MG tablet Commonly known as: PROTONIX Take 1 tablet (20 mg total) by mouth daily. Start taking on: January 12, 2021   QUEtiapine 50 MG tablet Commonly known as: SEROQUEL Take 5 tablets (250 mg total) by mouth daily at 6 PM. What changed: when to take this   rosuvastatin 20 MG tablet Commonly known as: CRESTOR Take 1 tablet (20 mg total) by mouth daily.   traZODone 150 MG tablet Commonly known as: DESYREL Take 1 tablet (150 mg total) by mouth at bedtime.        Patient provided with 14-day samples of above medications.  Patient also provided with printed scripts for 30 days worth of medications with 1 refill.  Disposition: self care; patient reported that he planned to present to [facility] tomorrow for substance use treatment  Ival Bible, MD 01/11/2021, 1:12 PM

## 2021-01-11 NOTE — ED Notes (Signed)
Patient denied SI, HI, AVH.  Requesting and given toothpaste.  Stated not quite ready to take morning medications, but will let staff know when he is ready.  Continue to monitor for safety.

## 2021-01-11 NOTE — Progress Notes (Signed)
All belongings returned and belongings sheet signed.  Patient ambulated independently to lobby without issue.  Patient discharged to home by safe transport.  Patient discharged in stable condition; no acute distress noted.

## 2021-01-11 NOTE — ED Notes (Signed)
Pt is sleeping in the bed. Respirations are even and unlabored. No acute distress noted. Will continue to monitor for safety.

## 2021-01-11 NOTE — ED Notes (Signed)
Pt is currently sleeping, no distress noted, environmental check complete, will continue to monitor patient for safety. ? ?

## 2021-01-11 NOTE — Discharge Instructions (Signed)
Take all medications as prescribed by his/her mental healthcare provider. Report any adverse effects and or reactions from the medicines to your outpatient provider promptly. Do not engage in alcohol and or illegal drug use while on prescription medicines. In the event of worsening symptoms, call the crisis hotline, 911 and or go to the nearest ED for appropriate evaluation and treatment of symptoms. follow-up with your primary care provider for your other medical issues, concerns and or health care needs.    Please come to [facility] (this facility) during walk in hours for appointment with psychiatrist for further medication management and for therapists for therapy.    Walk in hours are 8-11 AM Monday through Thursday for medication management.Therapy walk in hours are Monday-Wednesday 8 AM-1PM.   It is first come, first -serve; it is best to arrive by 7:00 AM.   On Friday from 1 pm to 4 pm for therapy intake only. Please arrive by 12:00 pm as it is  first come, first -serve.    When you arrive please go upstairs for your appointment. If you are unsure of where to go, inform the front desk that you are here for a walk in appointment and they will assist you with directions upstairs.  Address:  [address] in [place], [place] Ph: [phone]

## 2021-01-11 NOTE — Progress Notes (Signed)
AVS, including RX, medication samples and follow up appointments/resources, reviewed with patient.  All questions answered.  Patient verbalized understanding of information presented.  Patient denied SI, HI, AVH.  Stated they felt safe and ready for discharge.

## 2021-01-11 NOTE — Group Note (Signed)
Group Topic: Overcoming Obstacles  Group Date: 01/11/2021 Start Time: [place] Time: 1145 Facilitators: Levonne Hubert, NT  Department: [facility]  Number of Participants: 4  Group Focus: activities of daily living skills, co-dependency, coping skills, depression, and diagnosis education Treatment Modality:  Behavior Modification Therapy and Individual Therapy Interventions utilized were clarification, problem solving, story telling, and support Purpose: enhance coping skills, express feelings, express irrational fears, improve communication skills, increase insight, and regain self-worth  Name: Dylan [name] Date of Birth: 1965-02-05  MR: [id number]    Level of Participation: active Quality of Participation: attentive, cooperative, and engaged Interactions with others: gave feedback Mood/Affect: brightens with interaction Triggers (if applicable): N/A Cognition: coherent/clear and insightful Progress: Moderate Response: Well Plan: follow-up needed  Patients Problems:  Patient Active Problem List   Diagnosis Date Noted   Substance induced mood disorder ([place]) 10/26/2020   Bipolar affective disorder, mixed, severe, with psychotic behavior ([place]) 04/28/2020   Fatty liver 03/09/2020   Schizophrenia ([place]) 03/08/2020   Generalized anxiety disorder 02/03/2020   Insomnia due to other mental disorder 02/03/2020   Lisfranc dislocation, right, initial encounter    Prediabetes 07/22/2019   Tobacco abuse 07/10/2018   ESRD needing dialysis ([place]) 03/03/2018   Schizoaffective disorder, bipolar type ([place])    Acute renal failure (ARF) ([place])    Suicidal ideations    Chronic 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 coma ([place])    AKI (acute kidney injury) ([place]) 02/19/2017   Rhabdomyolysis 02/19/2017   Suicidal overdose ([place]) 02/19/2017   Acute hepatitis 02/19/2017   Elevated LFTs    Schizoaffective disorder, depressive type ([place]) 09/05/2011    Degenerative disc disease 08/21/2011   HIV disease ([place]) 08/17/2011

## 2021-01-17 ENCOUNTER — Ambulatory Visit: Payer: Medicaid Other | Admitting: Physician Assistant

## 2021-01-17 ENCOUNTER — Other Ambulatory Visit: Payer: Self-pay

## 2021-01-17 VITALS — BP 140/86 | HR 86 | Temp 98.2°F | Resp 18 | Ht 74.0 in | Wt 235.0 lb

## 2021-01-17 DIAGNOSIS — F1510 Other stimulant abuse, uncomplicated: Secondary | ICD-10-CM

## 2021-01-17 DIAGNOSIS — F411 Generalized anxiety disorder: Secondary | ICD-10-CM

## 2021-01-17 DIAGNOSIS — R9389 Abnormal findings on diagnostic imaging of other specified body structures: Secondary | ICD-10-CM

## 2021-01-17 DIAGNOSIS — E559 Vitamin D deficiency, unspecified: Secondary | ICD-10-CM

## 2021-01-17 DIAGNOSIS — K869 Disease of pancreas, unspecified: Secondary | ICD-10-CM | POA: Diagnosis not present

## 2021-01-17 DIAGNOSIS — R7989 Other specified abnormal findings of blood chemistry: Secondary | ICD-10-CM

## 2021-01-17 DIAGNOSIS — F1010 Alcohol abuse, uncomplicated: Secondary | ICD-10-CM | POA: Insufficient documentation

## 2021-01-17 DIAGNOSIS — B20 Human immunodeficiency virus [HIV] disease: Secondary | ICD-10-CM | POA: Diagnosis not present

## 2021-01-17 DIAGNOSIS — K769 Liver disease, unspecified: Secondary | ICD-10-CM

## 2021-01-17 DIAGNOSIS — F3164 Bipolar disorder, current episode mixed, severe, with psychotic features: Secondary | ICD-10-CM | POA: Diagnosis not present

## 2021-01-17 DIAGNOSIS — R7303 Prediabetes: Secondary | ICD-10-CM

## 2021-01-17 MED ORDER — HYDROXYZINE HCL 25 MG PO TABS: 25.0000 mg | ORAL_TABLET | Freq: Three times a day (TID) | ORAL | 1 refills | Status: DC | PRN

## 2021-01-17 NOTE — Progress Notes (Signed)
Patient has eaten and taken medication today. Patient denies pain at this time.

## 2021-01-17 NOTE — Patient Instructions (Signed)
We will work on authorization for the imaging to be completed on your pancreas.  We will call you as soon as we have an appointment scheduled for you.  I did add hydroxyzine to your medication list so you may use it every 8 hours as needed.  You will follow-up with the mobile medicine unit in approximately 3 to 4 weeks.  We will call you with today's lab results.  Kennieth Rad, PA-C Physician Assistant [facility] [email or web address]   Health Maintenance, Male Adopting a healthy lifestyle and getting preventive care are important in promoting health and wellness. Ask your health care provider about: The right schedule for you to have regular tests and exams. Things you can do on your own to prevent diseases and keep yourself healthy. What should I know about diet, weight, and exercise? Eat a healthy diet  Eat a diet that includes plenty of vegetables, fruits, low-fat dairy products, and lean protein. Do not eat a lot of foods that are high in solid fats, added sugars, or sodium. Maintain a healthy weight Body mass index (BMI) is a measurement that can be used to identify possible weight problems. It estimates body fat based on height and weight. Your health care provider can help determine your BMI and help you achieve or maintain a healthy weight. Get regular exercise Get regular exercise. This is one of the most important things you can do for your health. Most adults should: Exercise for at least 150 minutes each week. The exercise should increase your heart rate and make you sweat (moderate-intensity exercise). Do strengthening exercises at least twice a week. This is in addition to the moderate-intensity exercise. Spend less time sitting. Even light physical activity can be beneficial. Watch cholesterol and blood lipids Have your blood tested for lipids and cholesterol at 56 years of age, then have this test every 5  years. You may need to have your cholesterol levels checked more often if: Your lipid or cholesterol levels are high. You are older than 56 years of age. You are at high risk for heart disease. What should I know about cancer screening? Many types of cancers can be detected early and may often be prevented. Depending on your health history and family history, you may need to have cancer screening at various ages. This may include screening for: Colorectal cancer. Prostate cancer. Skin cancer. Lung cancer. What should I know about heart disease, diabetes, and high blood pressure? Blood pressure and heart disease High blood pressure causes heart disease and increases the risk of stroke. This is more likely to develop in people who have high blood pressure readings or are overweight. Talk with your health care provider about your target blood pressure readings. Have your blood pressure checked: Every 3-5 years if you are 31-46 years of age. Every year if you are 42 years old or older. If you are between the ages of 74 and 72 and are a current or former smoker, ask your health care provider if you should have a one-time screening for abdominal aortic aneurysm (AAA). Diabetes Have regular diabetes screenings. This checks your fasting blood sugar level. Have the screening done: Once every three years after age 59 if you are at a normal weight and have a low risk for diabetes. More often and at a younger age if you are overweight or have a high risk for diabetes. What should I know about preventing infection? Hepatitis B If you have a higher risk for hepatitis  B, you should be screened for this virus. Talk with your health care provider to find out if you are at risk for hepatitis B infection. Hepatitis C Blood testing is recommended for: Everyone born from [birth year] through 1965. Anyone with known risk factors for hepatitis C. Sexually transmitted infections (STIs) You should be screened each  year for STIs, including gonorrhea and chlamydia, if: You are sexually active and are younger than 56 years of age. You are older than 56 years of age and your health care provider tells you that you are at risk for this type of infection. Your sexual activity has changed since you were last screened, and you are at increased risk for chlamydia or gonorrhea. Ask your health care provider if you are at risk. Ask your health care provider about whether you are at high risk for HIV. Your health care provider may recommend a prescription medicine to help prevent HIV infection. If you choose to take medicine to prevent HIV, you should first get tested for HIV. You should then be tested every 3 months for as long as you are taking the medicine. Follow these instructions at home: Alcohol use Do not drink alcohol if your health care provider tells you not to drink. If you drink alcohol: Limit how much you have to 0-2 drinks a day. Know how much alcohol is in your drink. In the [place] one drink equals one 12 oz bottle of beer (355 mL), one 5 oz glass of wine (148 mL), or one 1 oz glass of hard liquor (44 mL). Lifestyle Do not use any products that contain nicotine or tobacco. These products include cigarettes, chewing tobacco, and vaping devices, such as e-cigarettes. If you need help quitting, ask your health care provider. Do not use street drugs. Do not share needles. Ask your health care provider for help if you need support or information about quitting drugs. General instructions Schedule regular health, dental, and eye exams. Stay current with your vaccines. Tell your health care provider if: You often feel depressed. You have ever been abused or do not feel safe at home. Summary Adopting a healthy lifestyle and getting preventive care are important in promoting health and wellness. Follow your health care provider's instructions about healthy diet, exercising, and getting tested or screened  for diseases. Follow your health care provider's instructions on monitoring your cholesterol and blood pressure. This information is not intended to replace advice given to you by your health care provider. Make sure you discuss any questions you have with your health care provider. Document Revised: 05/17/2020 Document Reviewed: 05/17/2020 Elsevier Patient Education  [place].

## 2021-01-17 NOTE — Progress Notes (Signed)
Established Patient Office Visit  Subjective:  Patient ID: Dylan [name], male    DOB: April 25, 1965  Age: 56 y.o. MRN: [id number]  CC:  Chief Complaint  Patient presents with   Medication Management    HPI Dylan [name] states that he is currently being treated for substance abuse at [facility] residential treatment center, states that he arrived approximately 5 days ago and does plan on going to long-term care after he completes his program.  States that he has been having depressed moods, states that he has had thoughts that he would be better off dead, adamantly denies any plan of self-harm, denies any HI.  States that he was not consistent to his behavioral health medications prior to his hospitalization at the beginning of this year.  Reports that he has noticed some improvement since restarting and being consistent to medications.  Reports that he was prescribed hydroxyzine, but has not needed it.  Reports sleep is good, appetite is good.  States that while he was hospitalized upper quadrant abdominal pain which has since resolved since taking Protonix on a daily basis, however he is concerned due to ultrasound showing suspected solid lesion on the pancreatic head.   IMPRESSION: Cholelithiasis, without associated sonographic findings to suggest acute cholecystitis.   Suspected 2.6 cm solid lesion along the pancreatic head, poorly evaluated on ultrasound. Consider CT or MRI abdomen with/without contrast (pancreas protocol) for further evaluation.     Electronically Signed   By: Julian Hy M.D.   On: 01/03/2021 10:01  Also endorses significant history of HIV for which he is followed by infectious disease.  States that he did go into kidney failure approximately 3 years ago but it has since then resolved.    Past Medical History:  Diagnosis Date   Anxiety    Arthritis    Bipolar 1 disorder ([place])    Colon polyps    Depression    GERD (gastroesophageal reflux  disease)    Hepatitis B    Human immunodeficiency virus (HIV) ([place])    Hyperlipidemia    Hypertension    Neuromuscular disorder ([place])    neuropathy   Neuropathy    Pre-diabetes    Schizophrenia ([place])     Past Surgical History:  Procedure Laterality Date   FOOT ARTHRODESIS Right 09/17/2019   Procedure: FUSION RIGHT LISFRANC JOINT;  Surgeon: Newt Minion, MD;  Location: [facility];  Service: Orthopedics;  Laterality: Right;   FOOT ARTHRODESIS Right 09/2019   HEMORROIDECTOMY     IR FLUORO GUIDE CV LINE RIGHT  02/22/2017   IR US GUIDE VASC ACCESS RIGHT  02/22/2017   TUMOR REMOVAL     From Chest    Family History  Problem Relation Age of Onset   CAD Mother    Diabetes Father    Colon cancer Maternal Aunt 60   Diabetes Maternal Aunt    Heart disease Maternal Uncle    Esophageal cancer Neg Hx    Rectal cancer Neg Hx    Stomach cancer Neg Hx     Social History   Socioeconomic History   Marital status: Married    Spouse name: Not on file   Number of children: 1   Years of education: Not on file   Highest education level: Not on file  Occupational History   Not on file  Tobacco Use   Smoking status: Every Day    Packs/day: 0.50    Years: 15.00    Pack years: 7.50  Types: E-cigarettes, Cigarettes   Smokeless tobacco: Never   Tobacco comments:    has patches when he is ready to quit  Vaping Use   Vaping Use: Some days   Substances: Nicotine  Substance and Sexual Activity   Alcohol use: Yes    Comment: occassional use on weekends   Drug use: Yes    Types: Cocaine, Marijuana    Comment: last use was Friday 03/05/2020   Sexual activity: Yes    Partners: Female    Birth control/protection: Condom    Comment: condoms given  Other Topics Concern   Not on file  Social History Narrative   Not on file   Social Determinants of Health   Financial Resource Strain: Not on file  Food Insecurity: Not on file  Transportation Needs: Not on file  Physical Activity: Not on  file  Stress: Not on file  Social Connections: Not on file  Intimate Partner Violence: Not on file    Outpatient Medications Prior to Visit  Medication Sig Dispense Refill   bictegravir-emtricitabine-tenofovir AF (BIKTARVY) 50-200-25 MG TABS tablet Take 1 tablet by mouth daily. 30 tablet 1   DULoxetine (CYMBALTA) 30 MG capsule Take 3 capsules (90 mg total) by mouth daily. 90 capsule 1   gabapentin (NEURONTIN) 300 MG capsule Take 2 capsules (600 mg total) by mouth in the morning. 30 capsule 1   gabapentin (NEURONTIN) 300 MG capsule Take 3 capsules (900 mg total) by mouth at bedtime. 30 capsule 1   nicotine polacrilex (NICORETTE) 2 MG gum Take 1 each (2 mg total) by mouth 3 (three) times daily as needed for smoking cessation. 100 tablet 1   pantoprazole (PROTONIX) 20 MG tablet Take 1 tablet (20 mg total) by mouth daily. 30 tablet 1   QUEtiapine (SEROQUEL) 50 MG tablet Take 5 tablets (250 mg total) by mouth daily at 6 PM. 150 tablet 1   rosuvastatin (CRESTOR) 20 MG tablet Take 1 tablet (20 mg total) by mouth daily. 30 tablet 1   traZODone (DESYREL) 150 MG tablet Take 1 tablet (150 mg total) by mouth at bedtime. 30 tablet 1   hydrOXYzine (ATARAX) 25 MG tablet Take 1 tablet (25 mg total) by mouth 3 (three) times daily as needed for anxiety. 60 tablet 1   No facility-administered medications prior to visit.    Allergies  Allergen Reactions   Penicillins Other (See Comments)    Childhood allergy   Latex Rash   Tape Rash    ROS Review of Systems  Constitutional: Negative.   HENT: Negative.    Eyes: Negative.   Respiratory:  Negative for shortness of breath.   Cardiovascular:  Negative for chest pain.  Endocrine: Negative.   Genitourinary: Negative.   Musculoskeletal: Negative.   Skin: Negative.   Allergic/Immunologic: Negative.   Neurological: Negative.   Hematological: Negative.   Psychiatric/Behavioral:  Positive for dysphoric mood. Negative for self-injury, sleep disturbance  and suicidal ideas. The patient is not nervous/anxious.      Objective:    Physical Exam Vitals and nursing note reviewed.  Constitutional:      Appearance: Normal appearance.  HENT:     Head: Normocephalic and atraumatic.     Right Ear: External ear normal.     Left Ear: External ear normal.     Nose: Nose normal.     Mouth/Throat:     Mouth: Mucous membranes are moist.     Pharynx: Oropharynx is clear.  Eyes:     Extraocular Movements:  Extraocular movements intact.     Conjunctiva/sclera: Conjunctivae normal.     Pupils: Pupils are equal, round, and reactive to light.  Cardiovascular:     Rate and Rhythm: Normal rate and regular rhythm.     Pulses: Normal pulses.     Heart sounds: Normal heart sounds.  Pulmonary:     Effort: Pulmonary effort is normal.     Breath sounds: Normal breath sounds.  Musculoskeletal:     Cervical back: Normal range of motion and neck supple.  Neurological:     Mental Status: He is alert.  Psychiatric:        Attention and Perception: Attention normal.        Mood and Affect: Mood is depressed.        Speech: Speech normal.        Behavior: Behavior normal. Behavior is cooperative.        Thought Content: Thought content does not include homicidal or suicidal plan.        Cognition and Memory: Cognition and memory normal.        Judgment: Judgment normal.    BP 140/86 (BP Location: Left Arm, Patient Position: Sitting, Cuff Size: Normal)    Pulse 86    Temp 98.2 F (36.8 C) (Oral)    Resp 18    Ht 6\' 2"  (1.88 m)    Wt 235 lb (106.6 kg)    SpO2 98%    BMI 30.17 kg/m  Wt Readings from Last 3 Encounters:  01/17/21 235 lb (106.6 kg)  01/02/21 230 lb (104.3 kg)  07/14/20 233 lb (105.7 kg)     Health Maintenance Due  Topic Date Due   Zoster Vaccines- Shingrix (1 of 2) Never done    There are no preventive care reminders to display for this patient.  Lab Results  Component Value Date   TSH 0.512 10/26/2020   Lab Results  Component  Value Date   WBC 10.5 01/07/2021   HGB 15.4 01/07/2021   HCT 47.4 01/07/2021   MCV 91.3 01/07/2021   PLT 193 01/07/2021   Lab Results  Component Value Date   NA 137 01/08/2021   K 3.7 01/08/2021   CO2 23 01/08/2021   GLUCOSE 123 (H) 01/08/2021   BUN 22 (H) 01/08/2021   CREATININE 1.19 01/08/2021   BILITOT 0.9 01/08/2021   ALKPHOS 105 01/08/2021   AST 109 (H) 01/08/2021   ALT 56 (H) 01/08/2021   PROT 7.2 01/08/2021   ALBUMIN 4.0 01/08/2021   CALCIUM 9.4 01/08/2021   ANIONGAP 11 01/08/2021   Lab Results  Component Value Date   CHOL 266 (H) 01/07/2021   Lab Results  Component Value Date   HDL 59 01/07/2021   Lab Results  Component Value Date   LDLCALC 191 (H) 01/07/2021   Lab Results  Component Value Date   TRIG 81 01/07/2021   Lab Results  Component Value Date   CHOLHDL 4.5 01/07/2021   Lab Results  Component Value Date   HGBA1C 6.0 (H) 10/26/2020      Assessment & Plan:   Problem List Items Addressed This Visit       Digestive   Lesion of pancreas   Relevant Orders   CT PANCREAS ABD W/WO     Other   HIV disease (HCC)   Elevated LFTs   Relevant Orders   Comprehensive metabolic panel   Prediabetes   Generalized anxiety disorder   Relevant Medications   hydrOXYzine (ATARAX) 25 MG  tablet   Other Relevant Orders   Vitamin D, 25-hydroxy   Bipolar affective disorder, mixed, severe, with psychotic behavior ([place]) - Primary   Relevant Orders   Vitamin D, 25-hydroxy   Alcohol abuse   Methamphetamine abuse ([place])    Meds ordered this encounter  Medications   hydrOXYzine (ATARAX) 25 MG tablet    Sig: Take 1 tablet (25 mg total) by mouth 3 (three) times daily as needed for anxiety.    Dispense:  60 tablet    Refill:  1    Order Specific Question:   Supervising Provider    Answer:   WRIGHT, PATRICK E [[id number]]  1. Bipolar affective disorder, mixed, severe, with psychotic behavior ([place]) Patient restarted medications 2 weeks ago, continue current  regimen at this time, no refills needed today  Patient to follow-up with mobile unit in 3 to 4 weeks.  Patient understands and agrees - Vitamin D, 25-hydroxy  2. Generalized anxiety disorder Hydroxyzine as needed - Vitamin D, 25-hydroxy - hydrOXYzine (ATARAX) 25 MG tablet; Take 1 tablet (25 mg total) by mouth 3 (three) times daily as needed for anxiety.  Dispense: 60 tablet; Refill: 1  3. Lesion of pancreas  - CT PANCREAS ABD W/WO; Future  4. HIV disease ([place]) Continue follow-up with infectious disease  5. Prediabetes A1c 5.9.  Patient education given on low sugar diet  6. Alcohol abuse Currently in substance abuse treatment program  7. Methamphetamine abuse ([place]) Currently in substance abuse treatment program  8. Elevated LFT's - Comprehensive metabolic panel   I have reviewed the patient's medical history (PMH, PSH, Social History, Family History, Medications, and allergies) , and have been updated if relevant. I spent 30 minutes reviewing chart and  face to face time with patient.    Follow-up: Return if symptoms worsen or fail to improve.    Loraine Grip Mayers, PA-C

## 2021-01-18 DIAGNOSIS — E559 Vitamin D deficiency, unspecified: Secondary | ICD-10-CM | POA: Insufficient documentation

## 2021-01-18 LAB — COMPREHENSIVE METABOLIC PANEL
ALT: 31 IU/L (ref 0–44)
AST: 22 IU/L (ref 0–40)
Albumin/Globulin Ratio: 1.5 (ref 1.2–2.2)
Albumin: 4.2 g/dL (ref 3.8–4.9)
Alkaline Phosphatase: 104 IU/L (ref 44–121)
BUN/Creatinine Ratio: 12 (ref 9–20)
BUN: 12 mg/dL (ref 6–24)
Bilirubin Total: <0.2 mg/dL (ref 0.0–1.2)
CO2: 22 mmol/L (ref 20–29)
Calcium: 9.4 mg/dL (ref 8.7–10.2)
Chloride: 103 mmol/L (ref 96–106)
Creatinine, Ser: 1.03 mg/dL (ref 0.76–1.27)
Globulin, Total: 2.8 g/dL (ref 1.5–4.5)
Glucose: 93 mg/dL (ref 70–99)
Potassium: 4.7 mmol/L (ref 3.5–5.2)
Sodium: 139 mmol/L (ref 134–144)
Total Protein: 7 g/dL (ref 6.0–8.5)
eGFR: 86 mL/min/{1.73_m2} (ref >59)

## 2021-01-18 LAB — VITAMIN D 25 HYDROXY (VIT D DEFICIENCY, FRACTURES): Vit D, 25-Hydroxy: 14.4 ng/mL — ABNORMAL LOW (ref 30.0–100.0)

## 2021-01-18 MED ORDER — VITAMIN D (ERGOCALCIFEROL) 1.25 MG (50000 UNIT) PO CAPS: 50000.0000 [IU] | ORAL_CAPSULE | ORAL | 2 refills | Status: DC

## 2021-01-18 NOTE — Addendum Note (Signed)
Addended by: Kennieth Rad on: 01/18/2021 08:33 AM   Modules accepted: Orders

## 2021-01-19 ENCOUNTER — Telehealth (HOSPITAL_COMMUNITY): Payer: Self-pay | Admitting: Family Medicine

## 2021-01-19 NOTE — BH Assessment (Signed)
Care Management - [place] Follow Up Discharges   Writer attempted to make contact with patient today and was unsuccessful.  Writer left a HIPPA compliant voice message.   Per chart review, patient was provided with substance abuse outpatient resources.

## 2021-01-19 NOTE — Progress Notes (Signed)
Patients friend will deliver the message to return a phone call to [facility] regarding results and appt.

## 2021-01-20 ENCOUNTER — Other Ambulatory Visit: Payer: Self-pay

## 2021-01-20 ENCOUNTER — Ambulatory Visit (INDEPENDENT_AMBULATORY_CARE_PROVIDER_SITE_OTHER): Payer: Medicaid Other | Admitting: Internal Medicine

## 2021-01-20 ENCOUNTER — Telehealth: Payer: Self-pay

## 2021-01-20 VITALS — BP 129/73 | HR 89 | Resp 16 | Ht 74.0 in | Wt 240.0 lb

## 2021-01-20 DIAGNOSIS — B20 Human immunodeficiency virus [HIV] disease: Secondary | ICD-10-CM

## 2021-01-20 DIAGNOSIS — K219 Gastro-esophageal reflux disease without esophagitis: Secondary | ICD-10-CM

## 2021-01-20 DIAGNOSIS — F251 Schizoaffective disorder, depressive type: Secondary | ICD-10-CM

## 2021-01-20 DIAGNOSIS — K2100 Gastro-esophageal reflux disease with esophagitis, without bleeding: Secondary | ICD-10-CM | POA: Diagnosis not present

## 2021-01-20 MED ORDER — PANTOPRAZOLE SODIUM 20 MG PO TBEC: 20.0000 mg | DELAYED_RELEASE_TABLET | Freq: Two times a day (BID) | ORAL | 6 refills | Status: DC

## 2021-01-20 MED ORDER — BIKTARVY 50-200-25 MG PO TABS: 1.0000 | ORAL_TABLET | Freq: Every day | ORAL | 11 refills | Status: DC

## 2021-01-20 NOTE — Progress Notes (Signed)
Patient ID: Dylan [name], male   DOB: 1965/09/19, 56 y.o.   MRN: [id number]  HPI [name] is a 56yo M with well controlled hiv disease, who was hospitalized on christmas for  epigastric pain, presumably thought to have gallstone. Did find to have some transaminitis and also pancreatic head lesion not previously known. Awaiting to get MRI.  Uptodate on covid vaccines Unable to do cabenuva since RPV resistance  Neuropathy improved with current gabapentin  Outpatient Encounter Medications as of 01/20/2021  Medication Sig   bictegravir-emtricitabine-tenofovir AF (BIKTARVY) 50-200-25 MG TABS tablet Take 1 tablet by mouth daily.   DULoxetine (CYMBALTA) 30 MG capsule Take 3 capsules (90 mg total) by mouth daily.   gabapentin (NEURONTIN) 300 MG capsule Take 2 capsules (600 mg total) by mouth in the morning.   gabapentin (NEURONTIN) 300 MG capsule Take 3 capsules (900 mg total) by mouth at bedtime.   hydrOXYzine (ATARAX) 25 MG tablet Take 1 tablet (25 mg total) by mouth 3 (three) times daily as needed for anxiety.   nicotine polacrilex (NICORETTE) 2 MG gum Take 1 each (2 mg total) by mouth 3 (three) times daily as needed for smoking cessation.   pantoprazole (PROTONIX) 20 MG tablet Take 1 tablet (20 mg total) by mouth daily.   QUEtiapine (SEROQUEL) 50 MG tablet Take 5 tablets (250 mg total) by mouth daily at 6 PM.   rosuvastatin (CRESTOR) 20 MG tablet Take 1 tablet (20 mg total) by mouth daily.   traZODone (DESYREL) 150 MG tablet Take 1 tablet (150 mg total) by mouth at bedtime.   Vitamin D, Ergocalciferol, (DRISDOL) 1.25 MG (50000 UNIT) CAPS capsule Take 1 capsule (50,000 Units total) by mouth every 7 (seven) days.   No facility-administered encounter medications on file as of 01/20/2021.     Patient Active Problem List   Diagnosis Date Noted   Vitamin D deficiency 01/18/2021   Alcohol abuse 01/17/2021   Methamphetamine abuse ([place]) 01/17/2021   Lesion of pancreas 01/17/2021   Substance  induced mood disorder ([place]) 10/26/2020   Bipolar affective disorder, mixed, severe, with psychotic behavior ([place]) 04/28/2020   Fatty liver 03/09/2020   Schizophrenia ([place]) 03/08/2020   Generalized anxiety disorder 02/03/2020   Insomnia due to other mental disorder 02/03/2020   Lisfranc dislocation, right, initial encounter    Prediabetes 07/22/2019   Tobacco abuse 07/10/2018   ESRD needing dialysis ([place]) 03/03/2018   Schizoaffective disorder, bipolar type ([place])    Acute renal failure (ARF) ([place])    Suicidal ideations    Chronic 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 coma ([place])    AKI (acute kidney injury) ([place]) 02/19/2017   Rhabdomyolysis 02/19/2017   Suicidal overdose ([place]) 02/19/2017   Acute hepatitis 02/19/2017   Elevated LFTs    Schizoaffective disorder, depressive type ([place]) 09/05/2011   Degenerative disc disease 08/21/2011   HIV disease ([place]) 08/17/2011     Health Maintenance Due  Topic Date Due   Zoster Vaccines- Shingrix (1 of 2) Never done     Review of Systems Review of Systems  Constitutional: Negative for fever, chills, diaphoresis, activity change, appetite change, fatigue and unexpected weight change.  HENT: Negative for congestion, sore throat, rhinorrhea, sneezing, trouble swallowing and sinus pressure.  Eyes: Negative for photophobia and visual disturbance.  Respiratory: Negative for cough, chest tightness, shortness of breath, wheezing and stridor.  Cardiovascular: Negative for chest pain, palpitations and leg swelling.  Gastrointestinal: Negative for nausea, vomiting, abdominal pain, diarrhea, constipation, blood in stool,  abdominal distention and anal bleeding.  Genitourinary: Negative for dysuria, hematuria, flank pain and difficulty urinating.  Musculoskeletal: Negative for myalgias, back pain, joint swelling, arthralgias and gait problem.  Skin: Negative for color change, pallor, rash and wound.  Neurological: Negative for dizziness, tremors,  weakness and light-headedness.  Hematological: Negative for adenopathy. Does not bruise/bleed easily.  Psychiatric/Behavioral: Negative for behavioral problems, confusion, sleep disturbance, dysphoric mood, decreased concentration and agitation.   Physical Exam   BP 129/73    Pulse 89    Resp 16    Ht 6\' 2"  (1.88 m)    Wt 240 lb (108.9 kg)    SpO2 98%    BMI 30.81 kg/m   Physical Exam  Constitutional: He is oriented to person, place, and time. He appears well-developed and well-nourished. No distress.  HENT:  Mouth/Throat: Oropharynx is clear and moist. No oropharyngeal exudate.  Cardiovascular: Normal rate, regular rhythm and normal heart sounds. Exam reveals no gallop and no friction rub.  No murmur heard.  Pulmonary/Chest: Effort normal and breath sounds normal. No respiratory distress. He has no wheezes.  Abdominal: Soft. Bowel sounds are normal. He exhibits no distension. There is no tenderness.  Lymphadenopathy:  He has no cervical adenopathy.  Neurological: He is alert and oriented to person, place, and time.  Skin: Skin is warm and dry. No rash noted. No erythema.  Psychiatric: He has a normal mood and affect. His behavior is normal.   Lab Results  Component Value Date   CD4TCELL 36 01/06/2021   Lab Results  Component Value Date   CD4TABS 910 01/06/2021   CD4TABS 648 07/06/2020   CD4TABS 797 03/24/2020   Lab Results  Component Value Date   HIV1RNAQUANT <20 (H) 01/06/2021   Lab Results  Component Value Date   HEPBSAB NON-REACTIVE 07/24/2017   Lab Results  Component Value Date   LABRPR NON-REACTIVE 07/06/2020    CBC Lab Results  Component Value Date   WBC 10.5 01/07/2021   RBC 5.19 01/07/2021   HGB 15.4 01/07/2021   HCT 47.4 01/07/2021   PLT 193 01/07/2021   MCV 91.3 01/07/2021   MCH 29.7 01/07/2021   MCHC 32.5 01/07/2021   RDW 13.1 01/07/2021   LYMPHSABS 2.3 01/07/2021   MONOABS 0.7 01/07/2021   EOSABS 0.0 01/07/2021    BMET Lab Results   Component Value Date   NA 139 01/17/2021   K 4.7 01/17/2021   CL 103 01/17/2021   CO2 22 01/17/2021   GLUCOSE 93 01/17/2021   BUN 12 01/17/2021   CREATININE 1.03 01/17/2021   CALCIUM 9.4 01/17/2021   GFRNONAA >60 01/08/2021   GFRAA 92 07/06/2020      Assessment and Plan  Pancreatic lesion = getting abdominal mri. Pending insurance approval  GERD = still having reflux at night, will refer to GI. Also will do trial of BID pantoprazole  Substance abuse=  Continue on [facility]  Neuropathy - continue with gabapentin At [facility] for the present moment

## 2021-01-20 NOTE — Telephone Encounter (Signed)
Received call from [facility] with [facility] requesting refill of seroquel be sent to [facility]. Secure chat sent to Dr. Baxter Flattery. Awaiting response.   Beryle Flock, RN

## 2021-01-21 MED ORDER — QUETIAPINE FUMARATE 50 MG PO TABS: 250.0000 mg | ORAL_TABLET | Freq: Every day | ORAL | 1 refills | Status: DC

## 2021-01-21 NOTE — Telephone Encounter (Signed)
Okay to refill seroquel 250 mg per Dr. Baxter Flattery.  Beryle Flock, RN

## 2021-01-21 NOTE — Addendum Note (Signed)
Addended by: Lucie Leather D on: 01/21/2021 02:48 PM   Modules accepted: Orders

## 2021-01-24 ENCOUNTER — Ambulatory Visit: Payer: Medicaid Other | Admitting: Internal Medicine

## 2021-01-26 ENCOUNTER — Telehealth: Payer: Self-pay | Admitting: *Deleted

## 2021-01-26 NOTE — Telephone Encounter (Signed)
-----   Message from Kennieth Rad, [place] sent at 01/18/2021  8:33 AM EST ----- Please call patient and let him know that his kidney function is within normal limits and his liver function has returned to within normal limits.  His vitamin D levels are low.  He needs to take 50,000 units once a week for the next 12 weeks.  Prescription sent to his pharmacy.

## 2021-01-26 NOTE — Telephone Encounter (Signed)
MA spoke with RN Phyilis to share patients results.

## 2021-01-28 ENCOUNTER — Telehealth: Payer: Self-pay | Admitting: Family Medicine

## 2021-01-28 NOTE — Telephone Encounter (Signed)
Copied from [place] [id number]. Topic: General - Other >> Jan 28, 2021 11:26 AM Tessa Lerner A wrote: Reason for CRM: Silva Bandy with [facility] has called requesting clarity from Dr. Margarita Rana or clinical staff on the patient's current prescription for DULoxetine   Silva Bandy has stressed the urgency of a response to prevent a potential med error   The documentation related to this prescription has been very confusing for members of [facility]'s staff   Please contact further when possible

## 2021-01-28 NOTE — Telephone Encounter (Signed)
Dylan [name] was called and a VM was left informing her to return phone call.  Crm has been created for PEC to give instruction on Cymbalta medication.

## 2021-01-31 NOTE — Telephone Encounter (Signed)
Dylan [name] called and has questions after I relayed the information on chart.   Pt initially was taking 3 30mg  of duloxetine, they want to know if he needs to be on 60 or 90 mg a day. They just want to know which one, they will need a Rx for 90 if so. Please advise   Please send to [facility] in [place].

## 2021-02-01 ENCOUNTER — Encounter (HOSPITAL_COMMUNITY): Payer: Self-pay

## 2021-02-01 ENCOUNTER — Other Ambulatory Visit: Payer: Self-pay

## 2021-02-01 ENCOUNTER — Ambulatory Visit (HOSPITAL_COMMUNITY)
Admission: RE | Admit: 2021-02-01 | Discharge: 2021-02-01 | Disposition: A | Discharge status: Home / Self Care | Payer: Medicaid Other | Source: Ambulatory Visit | Attending: Physician Assistant | Admitting: Physician Assistant

## 2021-02-01 DIAGNOSIS — K869 Disease of pancreas, unspecified: Secondary | ICD-10-CM | POA: Diagnosis present

## 2021-02-01 MED ORDER — IOHEXOL 300 MG/ML  SOLN
100.0000 mL | Freq: Once | INTRAMUSCULAR | Status: AC | PRN
  Administered 2021-02-01: 09:00:00 100 mL via INTRAVENOUS

## 2021-02-01 MED ORDER — SODIUM CHLORIDE (PF) 0.9 % IJ SOLN
INTRAMUSCULAR | Status: AC
  Filled 2021-02-01: qty 50

## 2021-02-02 DIAGNOSIS — K769 Liver disease, unspecified: Secondary | ICD-10-CM | POA: Insufficient documentation

## 2021-02-02 DIAGNOSIS — R9389 Abnormal findings on diagnostic imaging of other specified body structures: Secondary | ICD-10-CM | POA: Insufficient documentation

## 2021-02-02 NOTE — Addendum Note (Signed)
Addended by: Kennieth Rad on: 02/02/2021 03:08 PM   Modules accepted: Orders

## 2021-02-02 NOTE — Telephone Encounter (Signed)
LVM- informed the dosage is 90 mg, to take 3 po of Cymbalta.   Instructed to call pharmacy that he took printed script to request the refill. It may be that the pharmacy will need to transfer the refill to preferred pharmacy.

## 2021-02-03 ENCOUNTER — Telehealth: Payer: Self-pay | Admitting: *Deleted

## 2021-02-03 NOTE — Telephone Encounter (Signed)
MA left VM with person who answered and shared patient would be back in an hour. MA informed person of office closing and attempting to reach patient on this coming Monday.

## 2021-02-03 NOTE — Telephone Encounter (Signed)
-----   Message from Kennieth Rad, [place] sent at 02/02/2021  3:08 PM EST ----- Please call patient and let him know that the CT of his abdomen did not show any abnormalities of his pancreas.  The radiologist does recommend further follow-up with MRI of liver to be completed.  He did also have some small clusters of density in his right lower lung lobe, he does need to make sure that he follows up with the mobile unit or his primary care provider for further evaluation.

## 2021-02-07 ENCOUNTER — Telehealth: Payer: Self-pay | Admitting: *Deleted

## 2021-02-07 NOTE — Telephone Encounter (Signed)
RN Phyillis is aware of patients results and needing to keep PCP appointment 03/23/21 for additional imaging and further evaluation.

## 2021-02-07 NOTE — Telephone Encounter (Signed)
-----   Message from Kennieth Rad, [place] sent at 02/02/2021  3:08 PM EST ----- Please call patient and let him know that the CT of his abdomen did not show any abnormalities of his pancreas.  The radiologist does recommend further follow-up with MRI of liver to be completed.  He did also have some small clusters of density in his right lower lung lobe, he does need to make sure that he follows up with the mobile unit or his primary care provider for further evaluation.

## 2021-02-10 ENCOUNTER — Encounter: Payer: Self-pay | Admitting: Family Medicine

## 2021-02-15 ENCOUNTER — Encounter: Payer: Self-pay | Admitting: Internal Medicine

## 2021-02-17 ENCOUNTER — Telehealth: Payer: Self-pay

## 2021-02-17 NOTE — Telephone Encounter (Signed)
Pt has been scheduled and informed of appointment date and time.

## 2021-02-22 ENCOUNTER — Other Ambulatory Visit (HOSPITAL_COMMUNITY): Payer: Self-pay

## 2021-02-26 ENCOUNTER — Ambulatory Visit (HOSPITAL_COMMUNITY)
Admission: RE | Admit: 2021-02-26 | Discharge: 2021-02-26 | Disposition: A | Discharge status: Home / Self Care | Payer: Medicaid Other | Source: Ambulatory Visit | Attending: Physician Assistant | Admitting: Physician Assistant

## 2021-02-26 ENCOUNTER — Other Ambulatory Visit: Payer: Self-pay

## 2021-02-26 DIAGNOSIS — K769 Liver disease, unspecified: Secondary | ICD-10-CM | POA: Diagnosis not present

## 2021-02-26 MED ORDER — GADOBUTROL 1 MMOL/ML IV SOLN
9.6000 mL | Freq: Once | INTRAVENOUS | Status: AC | PRN
  Administered 2021-02-26: 9.6 mL via INTRAVENOUS

## 2021-02-28 ENCOUNTER — Telehealth: Payer: Self-pay | Admitting: *Deleted

## 2021-02-28 NOTE — Telephone Encounter (Signed)
-----   Message from Kennieth Rad, [place] sent at 02/28/2021 10:42 AM EST ----- Please call patient and let him know that his MRI for the questionable liver lesion did not show any suspicious mass in the liver or pancreas, a moderately prominent lymph node was noted near the liver, did recommend a follow-up CAT scan of the abdomen in 6 months to ensure stability of the lymph node.

## 2021-02-28 NOTE — Telephone Encounter (Signed)
MA UTR patient x2 with VM left for return phone call to [facility] at [phone].

## 2021-03-04 ENCOUNTER — Other Ambulatory Visit (HOSPITAL_COMMUNITY): Payer: Self-pay

## 2021-03-09 ENCOUNTER — Encounter: Payer: Self-pay | Admitting: Orthopedic Surgery

## 2021-03-09 ENCOUNTER — Encounter: Payer: Self-pay | Admitting: Internal Medicine

## 2021-03-10 ENCOUNTER — Encounter: Payer: Self-pay | Admitting: Internal Medicine

## 2021-03-10 ENCOUNTER — Other Ambulatory Visit: Payer: Self-pay

## 2021-03-10 DIAGNOSIS — B20 Human immunodeficiency virus [HIV] disease: Secondary | ICD-10-CM

## 2021-03-10 MED ORDER — BIKTARVY 50-200-25 MG PO TABS: 1.0000 | ORAL_TABLET | Freq: Every day | ORAL | 3 refills | Status: DC

## 2021-03-10 NOTE — Telephone Encounter (Signed)
Pt is s/p over 1 year lisfranc fusion right foot. OTC sole orthotic and carbon fiber plate recommended for use if doesn't work recommended proceed with revision fusion per last OV. ?Left knee s/p steroid shots and NSAIDS, was recommended at last OV for total knee arthroplasty. Please advise. ?Last seen in office 12/21/20 ?

## 2021-03-13 ENCOUNTER — Ambulatory Visit (HOSPITAL_COMMUNITY)
Admission: EM | Admit: 2021-03-13 | Discharge: 2021-03-15 | Disposition: A | Discharge status: Other Acute Inpatient Hospital | Payer: Medicaid Other | Attending: Behavioral Health | Admitting: Behavioral Health

## 2021-03-13 DIAGNOSIS — Z602 Problems related to living alone: Secondary | ICD-10-CM | POA: Insufficient documentation

## 2021-03-13 DIAGNOSIS — Z9151 Personal history of suicidal behavior: Secondary | ICD-10-CM | POA: Insufficient documentation

## 2021-03-13 DIAGNOSIS — F1290 Cannabis use, unspecified, uncomplicated: Secondary | ICD-10-CM | POA: Insufficient documentation

## 2021-03-13 DIAGNOSIS — F419 Anxiety disorder, unspecified: Secondary | ICD-10-CM | POA: Insufficient documentation

## 2021-03-13 DIAGNOSIS — F17290 Nicotine dependence, other tobacco product, uncomplicated: Secondary | ICD-10-CM | POA: Insufficient documentation

## 2021-03-13 DIAGNOSIS — R45850 Homicidal ideations: Secondary | ICD-10-CM | POA: Insufficient documentation

## 2021-03-13 DIAGNOSIS — B20 Human immunodeficiency virus [HIV] disease: Secondary | ICD-10-CM | POA: Insufficient documentation

## 2021-03-13 DIAGNOSIS — Z20822 Contact with and (suspected) exposure to covid-19: Secondary | ICD-10-CM | POA: Insufficient documentation

## 2021-03-13 DIAGNOSIS — F319 Bipolar disorder, unspecified: Secondary | ICD-10-CM | POA: Insufficient documentation

## 2021-03-13 DIAGNOSIS — F22 Delusional disorders: Secondary | ICD-10-CM | POA: Insufficient documentation

## 2021-03-13 DIAGNOSIS — R45851 Suicidal ideations: Secondary | ICD-10-CM | POA: Insufficient documentation

## 2021-03-13 DIAGNOSIS — R450 Nervousness: Secondary | ICD-10-CM | POA: Insufficient documentation

## 2021-03-13 DIAGNOSIS — F3130 Bipolar disorder, current episode depressed, mild or moderate severity, unspecified: Secondary | ICD-10-CM

## 2021-03-13 DIAGNOSIS — Z9114 Patient's other noncompliance with medication regimen: Secondary | ICD-10-CM | POA: Insufficient documentation

## 2021-03-13 DIAGNOSIS — Z560 Unemployment, unspecified: Secondary | ICD-10-CM | POA: Insufficient documentation

## 2021-03-13 DIAGNOSIS — M79606 Pain in leg, unspecified: Secondary | ICD-10-CM | POA: Insufficient documentation

## 2021-03-13 DIAGNOSIS — Z818 Family history of other mental and behavioral disorders: Secondary | ICD-10-CM | POA: Insufficient documentation

## 2021-03-13 DIAGNOSIS — R4587 Impulsiveness: Secondary | ICD-10-CM | POA: Insufficient documentation

## 2021-03-13 DIAGNOSIS — F1010 Alcohol abuse, uncomplicated: Secondary | ICD-10-CM | POA: Insufficient documentation

## 2021-03-13 DIAGNOSIS — F251 Schizoaffective disorder, depressive type: Secondary | ICD-10-CM | POA: Insufficient documentation

## 2021-03-13 DIAGNOSIS — F1410 Cocaine abuse, uncomplicated: Secondary | ICD-10-CM | POA: Insufficient documentation

## 2021-03-13 DIAGNOSIS — R451 Restlessness and agitation: Secondary | ICD-10-CM | POA: Insufficient documentation

## 2021-03-13 DIAGNOSIS — Y900 Blood alcohol level of less than 20 mg/100 ml: Secondary | ICD-10-CM | POA: Insufficient documentation

## 2021-03-13 DIAGNOSIS — Z5986 Financial insecurity: Secondary | ICD-10-CM | POA: Insufficient documentation

## 2021-03-13 DIAGNOSIS — Z813 Family history of other psychoactive substance abuse and dependence: Secondary | ICD-10-CM | POA: Insufficient documentation

## 2021-03-13 DIAGNOSIS — Z79899 Other long term (current) drug therapy: Secondary | ICD-10-CM | POA: Insufficient documentation

## 2021-03-13 LAB — POCT URINE DRUG SCREEN - MANUAL ENTRY (I-SCREEN)
POC Amphetamine UR: NOT DETECTED
POC Buprenorphine (BUP): NOT DETECTED
POC Cocaine UR: POSITIVE — AB
POC Marijuana UR: NOT DETECTED
POC Methadone UR: NOT DETECTED
POC Methamphetamine UR: NOT DETECTED
POC Morphine: NOT DETECTED
POC Oxazepam (BZO): NOT DETECTED
POC Oxycodone UR: NOT DETECTED
POC Secobarbital (BAR): NOT DETECTED

## 2021-03-13 LAB — RESP PANEL BY RT-PCR (FLU A&B, COVID) ARPGX2
Influenza A by PCR: NEGATIVE
Influenza B by PCR: NEGATIVE
SARS Coronavirus 2 by RT PCR: NEGATIVE

## 2021-03-13 LAB — COMPREHENSIVE METABOLIC PANEL
ALT: 101 U/L — ABNORMAL HIGH (ref 0–44)
AST: 409 U/L — ABNORMAL HIGH (ref 15–41)
Albumin: 5 g/dL (ref 3.5–5.0)
Alkaline Phosphatase: 103 U/L (ref 38–126)
Anion gap: 11 (ref 5–15)
BUN: 28 mg/dL — ABNORMAL HIGH (ref 6–20)
CO2: 25 mmol/L (ref 22–32)
Calcium: 10.1 mg/dL (ref 8.9–10.3)
Chloride: 99 mmol/L (ref 98–111)
Creatinine, Ser: 1.07 mg/dL (ref 0.61–1.24)
GFR, Estimated: >60 mL/min (ref >60)
Glucose, Bld: 77 mg/dL (ref 70–99)
Potassium: 4.3 mmol/L (ref 3.5–5.1)
Sodium: 135 mmol/L (ref 135–145)
Total Bilirubin: 0.7 mg/dL (ref 0.3–1.2)
Total Protein: 8.4 g/dL — ABNORMAL HIGH (ref 6.5–8.1)

## 2021-03-13 LAB — CBC WITH DIFFERENTIAL/PLATELET
Abs Immature Granulocytes: 0.05 10*3/uL (ref 0.00–0.07)
Basophils Absolute: 0 10*3/uL (ref 0.0–0.1)
Basophils Relative: 0 %
Eosinophils Absolute: 0 10*3/uL (ref 0.0–0.5)
Eosinophils Relative: 0 %
HCT: 46.9 % (ref 39.0–52.0)
Hemoglobin: 15.7 g/dL (ref 13.0–17.0)
Immature Granulocytes: 0 %
Lymphocytes Relative: 20 %
Lymphs Abs: 2.5 10*3/uL (ref 0.7–4.0)
MCH: 30.3 pg (ref 26.0–34.0)
MCHC: 33.5 g/dL (ref 30.0–36.0)
MCV: 90.4 fL (ref 80.0–100.0)
Monocytes Absolute: 1 10*3/uL (ref 0.1–1.0)
Monocytes Relative: 8 %
Neutro Abs: 9.1 10*3/uL — ABNORMAL HIGH (ref 1.7–7.7)
Neutrophils Relative %: 72 %
Platelets: 228 10*3/uL (ref 150–400)
RBC: 5.19 MIL/uL (ref 4.22–5.81)
RDW: 13.6 % (ref 11.5–15.5)
WBC: 12.7 10*3/uL — ABNORMAL HIGH (ref 4.0–10.5)
nRBC: 0 % (ref 0.0–0.2)

## 2021-03-13 LAB — HEMOGLOBIN A1C
Hgb A1c MFr Bld: 5.7 % — ABNORMAL HIGH (ref 4.8–5.6)
Mean Plasma Glucose: 116.89 mg/dL

## 2021-03-13 LAB — TSH: TSH: 1.051 u[IU]/mL (ref 0.350–4.500)

## 2021-03-13 LAB — POC SARS CORONAVIRUS 2 AG: SARSCOV2ONAVIRUS 2 AG: NEGATIVE

## 2021-03-13 LAB — LIPID PANEL
Cholesterol: 196 mg/dL (ref 0–200)
HDL: 56 mg/dL (ref >40)
LDL Cholesterol: 128 mg/dL — ABNORMAL HIGH (ref 0–99)
Total CHOL/HDL Ratio: 3.5 RATIO
Triglycerides: 62 mg/dL (ref <150)
VLDL: 12 mg/dL (ref 0–40)

## 2021-03-13 LAB — POC SARS CORONAVIRUS 2 AG -  ED: SARS Coronavirus 2 Ag: NEGATIVE

## 2021-03-13 LAB — ETHANOL: Alcohol, Ethyl (B): <10 mg/dL (ref <10)

## 2021-03-13 MED ORDER — BICTEGRAVIR-EMTRICITAB-TENOFOV 50-200-25 MG PO TABS
1.0000 | ORAL_TABLET | Freq: Every day | ORAL | Status: DC
  Administered 2021-03-13 – 2021-03-15 (×3): 1 via ORAL
  Filled 2021-03-13 (×3): qty 1

## 2021-03-13 MED ORDER — MAGNESIUM HYDROXIDE 400 MG/5ML PO SUSP: 30.0000 mL | Freq: Every day | ORAL | Status: DC | PRN

## 2021-03-13 MED ORDER — DULOXETINE HCL 30 MG PO CPEP
30.0000 mg | ORAL_CAPSULE | Freq: Every day | ORAL | Status: DC
  Administered 2021-03-13 – 2021-03-15 (×3): 30 mg via ORAL
  Filled 2021-03-13 (×3): qty 1

## 2021-03-13 MED ORDER — GABAPENTIN 300 MG PO CAPS
300.0000 mg | ORAL_CAPSULE | Freq: Two times a day (BID) | ORAL | Status: DC
  Administered 2021-03-13 – 2021-03-15 (×5): 300 mg via ORAL
  Filled 2021-03-13 (×5): qty 1

## 2021-03-13 MED ORDER — HYDROXYZINE HCL 25 MG PO TABS
25.0000 mg | ORAL_TABLET | Freq: Three times a day (TID) | ORAL | Status: DC | PRN
  Administered 2021-03-13 – 2021-03-14 (×2): 25 mg via ORAL
  Filled 2021-03-13 (×2): qty 1

## 2021-03-13 MED ORDER — ALUM & MAG HYDROXIDE-SIMETH 200-200-20 MG/5ML PO SUSP
30.0000 mL | ORAL | Status: DC | PRN
  Administered 2021-03-15: 30 mL via ORAL
  Filled 2021-03-13: qty 30

## 2021-03-13 MED ORDER — TRAZODONE HCL 50 MG PO TABS
50.0000 mg | ORAL_TABLET | Freq: Every evening | ORAL | Status: DC | PRN
Start: 2021-03-13 — End: 2021-03-15
  Administered 2021-03-14: 50 mg via ORAL
  Filled 2021-03-13: qty 1

## 2021-03-13 MED ORDER — QUETIAPINE FUMARATE 100 MG PO TABS: 100.0000 mg | ORAL_TABLET | Freq: Every day | ORAL | Status: DC

## 2021-03-13 MED ORDER — ROSUVASTATIN CALCIUM 20 MG PO TABS
20.0000 mg | ORAL_TABLET | Freq: Every day | ORAL | Status: DC
  Administered 2021-03-13 – 2021-03-15 (×3): 20 mg via ORAL
  Filled 2021-03-13 (×3): qty 1

## 2021-03-13 MED ORDER — ACETAMINOPHEN 325 MG PO TABS
650.0000 mg | ORAL_TABLET | Freq: Four times a day (QID) | ORAL | Status: DC | PRN
Start: 2021-03-13 — End: 2021-03-15
  Administered 2021-03-13 – 2021-03-14 (×2): 650 mg via ORAL
  Filled 2021-03-13 (×2): qty 2

## 2021-03-13 MED ORDER — QUETIAPINE FUMARATE 100 MG PO TABS
100.0000 mg | ORAL_TABLET | Freq: Every day | ORAL | Status: DC
  Administered 2021-03-13 – 2021-03-14 (×2): 100 mg via ORAL
  Filled 2021-03-13 (×3): qty 1

## 2021-03-13 MED ORDER — NICOTINE 14 MG/24HR TD PT24
14.0000 mg | MEDICATED_PATCH | Freq: Every day | TRANSDERMAL | Status: DC
  Administered 2021-03-13 – 2021-03-15 (×3): 14 mg via TRANSDERMAL
  Filled 2021-03-13 (×3): qty 1

## 2021-03-13 NOTE — ED Notes (Signed)
Pt was lying in bed complaining that his legs hurt and that he was scheduled to have them amputated. This nurse asked patient what is wrong with his legs pt states they dont work and stopped working today ?

## 2021-03-13 NOTE — ED Notes (Signed)
[facility] called to collect STAT specimens to deliver to [facility]. ?
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
## 2021-03-13 NOTE — Progress Notes (Signed)
?   03/13/21 0726  ?[place] Triage Screening (Walk-ins at [facility] only)  ?How Did You Hear About [place]? Legal System  ?What Is the Reason for Your Visit/Call Today? SI, Hearing Voices  ?How Long Has This Been Causing You Problems? 1 wk - 1 month  ?Have You Recently Had Any Thoughts About Hurting Yourself? Yes  ?How long ago did you have thoughts about hurting yourself? week  ?Are You Planning to Commit Suicide/Harm Yourself At This time? No  ?Have you Recently Had Thoughts About [place]? No  ?Are You Planning To Harm Someone At This Time? No  ?Are you currently experiencing any auditory, visual or other hallucinations? Yes  ?Please explain the hallucinations you are currently experiencing: Pt reports the voices are telling him to cut his heart out.  ?Have You Used Any Alcohol or Drugs in the Past 24 Hours? Yes  ?How long ago did you use Drugs or Alcohol? 12 hours ago, Alcohol and Cocaine  ?What Did You Use and How Much? Alcohol [name]- 4 shots)  and Cocaine  ?Do you have any current medical co-morbidities that require immediate attention? No  ?Clinician description of patient physical appearance/behavior: Cooperative  ?What Do You Feel Would Help You the Most Today? Alcohol or Drug Use Treatment;Treatment for Depression or other mood problem  ?If access to [facility] was not available, would you have sought care in the Emergency Department? Yes  ?Determination of Need Urgent (48 hours)  ?Options For Referral Medication Management;BH Urgent Care  ? ? ?

## 2021-03-13 NOTE — ED Provider Notes (Signed)
Behavioral Health Admission [facility])  Date: 03/13/21 Patient Name: Dylan [name] MRN: [id number] Chief Complaint:  Chief Complaint  Patient presents with   Suicidal      Diagnoses:  Final diagnoses:  None    HPI: Dylan [name] is a 56 year old male patient who presents to the [facility] behavioral health urgent care voluntarily accompanied by law enforcement with a chief complaint of suicidal ideations with a plan to shoot himself with a double barrel shotgun. Patient denies a past psychiatric history significant for schizoaffective disorder.  Patient endorses suicidal ideations with a plan to shoot himself with a double barrel shotgun. He denies attempting suicide and reports a history of suicide years ago by overdosing on pills. He endorses homicidal ideations "sometimes" but does not specify a specific person. He endorses auditory hallucinations that he describes as voices telling him to take a pair scissors and cut his heart out. He states that this morning when he called the police he had a pair of scissors against his heart. He states that last week he had a knife against his heart. He denies visual hallucinations. There is no objective indication that the patient is currently responding to internal or external stimuli.  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 He reports a sobriety of 2 months. He reports drinking 4 shots of [name] and using cocaine on Friday. He reports vaping nicotine everyday.   He reports that he resides at an [employer] [facility] on [address] in [place]. He states that he works part-time at [employer]. He states that he hasn't been able to follow up here at the [facility] outpatient because they keep telling him to walk-in at 7:30 am and he can't make that time. He states that he's been off of his Seroquel, trazodone, gabapentin, Cymbalta, since the last time he was here. Per chart review patient was admitted to the [facility] in  December 2022. Patient states that he has been off his Biktarvy medication for a week.   Per chart review [facility] on 01/07/21: Upon admission, patient was restarted on his reported home medications of Biktarvy, duloxetine 60 mg daily, gabapentin 600 mg every morning/gabapentin 900 mg nightly, quetiapine 250 mg nightly, rosuvastatin 20 mg daily, trazodone 150 mg daily. Duloxetine was increased to 90 mg on 01/10/21.   PHQ 2-9:  [facility] Office Visit from 01/17/2021 in [facility] ED from 10/25/2020 in [facility] Office Visit from 03/17/2020 in [facility]  Thoughts that you would be better off dead, or of hurting yourself in some way More than half the days More than half the days Not at all  PHQ-9 Total Score $[id number]'18 15 9       '[id number]$ Flowsheet Row ED from 01/07/2021 in [facility] from 01/02/2021 in [place] ED from 10/25/2020 in [facility] High Risk No Risk Low Risk        Total Time spent with patient: 30 minutes  Musculoskeletal  Strength & Muscle Tone: within normal limits Gait & Station: normal Patient leans: N/A  Psychiatric Specialty Exam  Presentation General Appearance: Appropriate for Environment; Casual  Eye Contact:Fair  Speech:Clear and Coherent  Speech Volume:Normal  Handedness:Right   Mood and Affect  Mood:Depressed  Affect:Appropriate   Thought Process  Thought Processes:Coherent; Goal Directed  Descriptions of Associations:Intact  Orientation:Full (Time, Place and Person)  Thought Content:Logical; WDL  Diagnosis of Schizophrenia or Schizoaffective disorder in past: Yes  Duration of Psychotic Symptoms: Greater than six months  Hallucinations:Hallucinations: Auditory  Ideas of Reference:Paranoia  Suicidal Thoughts:Suicidal Thoughts: Yes, Active  Homicidal Thoughts:No  data recorded  Sensorium  Memory:Immediate Fair; Remote Fair; Recent Fair  Judgment:Intact  Insight:Present   Executive Functions  Concentration:Fair  Attention Span:Fair  [place]   Psychomotor Activity  Psychomotor Activity:Psychomotor Activity: Normal   Assets  Assets:Communication Skills; Desire for Improvement; Housing; Leisure Time; Intimacy; Physical Health; Social Support; Catering manager   Sleep  Sleep:Sleep: Poor   No data recorded  Physical Exam Constitutional:      Appearance: Normal appearance.  HENT:     Head: Normocephalic.     Nose: Nose normal.  Eyes:     Conjunctiva/sclera: Conjunctivae normal.  Cardiovascular:     Rate and Rhythm: Normal rate.  Pulmonary:     Effort: Pulmonary effort is normal.  Musculoskeletal:        General: Normal range of motion.     Cervical back: Normal range of motion.  Neurological:     Mental Status: He is alert and oriented to person, place, and time.   Review of Systems  Constitutional: Negative.   HENT: Negative.    Eyes: Negative.   Respiratory: Negative.    Cardiovascular: Negative.   Gastrointestinal: Negative.   Genitourinary: Negative.   Musculoskeletal: Negative.   Skin: Negative.   Neurological: Negative.   Endo/Heme/Allergies: Negative.    Blood pressure 128/85, pulse 98, temperature 97.8 F (36.6 C), temperature source Oral, resp. rate 19, SpO2 100 %. There is no height or weight on file to calculate BMI.  Past Psychiatric History: hx of substance use (cocaine & alcohol), schizoaffective dx, depression, bipolar   Is the patient at risk to self? Yes  Has the patient been a risk to self in the past 6 months? Yes .    Has the patient been a risk to self within the distant past? Yes   Is the patient a risk to others? Yes   Has the patient been a risk to others in the past 6 months? No   Has the patient been a risk to others within  the distant past? No   Past Medical History:  Past Medical History:  Diagnosis Date   Anxiety    Arthritis    Bipolar 1 disorder ([place])    Colon polyps    Depression    GERD (gastroesophageal reflux disease)    Hepatitis B    Human immunodeficiency virus (HIV) ([place])    Hyperlipidemia    Hypertension    Neuromuscular disorder ([place])    neuropathy   Neuropathy    Pre-diabetes    Schizophrenia ([place])     Past Surgical History:  Procedure Laterality Date   FOOT ARTHRODESIS Right 09/17/2019   Procedure: FUSION RIGHT LISFRANC JOINT;  Surgeon: Newt Minion, MD;  Location: [facility];  Service: Orthopedics;  Laterality: Right;   FOOT ARTHRODESIS Right 09/2019   HEMORROIDECTOMY     IR FLUORO GUIDE CV LINE RIGHT  02/22/2017   IR US GUIDE VASC ACCESS RIGHT  02/22/2017   TUMOR REMOVAL     From Chest    Family History:  Family History  Problem Relation Age of Onset   CAD Mother    Diabetes Father    Colon cancer Maternal Aunt 60   Diabetes Maternal Aunt    Heart disease Maternal  Uncle    Esophageal cancer Neg Hx    Rectal cancer Neg Hx    Stomach cancer Neg Hx     Social History:  Social History   Socioeconomic History   Marital status: Married    Spouse name: Not on file   Number of children: 1   Years of education: Not on file   Highest education level: Not on file  Occupational History   Not on file  Tobacco Use   Smoking status: Every Day    Packs/day: 0.50    Years: 15.00    Pack years: 7.50    Types: E-cigarettes, Cigarettes   Smokeless tobacco: Never   Tobacco comments:    has patches when he is ready to quit  Vaping Use   Vaping Use: Some days   Substances: Nicotine  Substance and Sexual Activity   Alcohol use: Yes    Comment: occassional use on weekends   Drug use: Yes    Types: Cocaine, Marijuana    Comment: last use was Friday 03/05/2020   Sexual activity: Yes    Partners: Female    Birth control/protection: Condom    Comment: condoms given  Other  Topics Concern   Not on file  Social History Narrative   Not on file   Social Determinants of Health   Financial Resource Strain: Not on file  Food Insecurity: Not on file  Transportation Needs: Not on file  Physical Activity: Not on file  Stress: Not on file  Social Connections: Not on file  Intimate Partner Violence: Not on file    SDOH:  SDOH Screenings   Alcohol Screen: Low Risk    Last Alcohol Screening Score (AUDIT): 3  Depression (PHQ2-9): Low Risk    PHQ-2 Score: 0  Financial Resource Strain: Not on file  Food Insecurity: Not on file  Housing: Not on file  Physical Activity: Not on file  Social Connections: Not on file  Stress: Not on file  Tobacco Use: High Risk   Smoking Tobacco Use: Every Day   Smokeless Tobacco Use: Never   Passive Exposure: Not on file  Transportation Needs: Not on file    Last Labs:  Office Visit on 01/17/2021  Component Date Value Ref Range Status   Glucose 01/17/2021 93  70 - 99 mg/dL Final   BUN 01/17/2021 12  6 - 24 mg/dL Final   Creatinine, Ser 01/17/2021 1.03  0.76 - 1.27 mg/dL Final   eGFR 01/17/2021 86  >59 mL/min/1.73 Final   BUN/Creatinine Ratio 01/17/2021 12  9 - 20 Final   Sodium 01/17/2021 139  134 - 144 mmol/L Final   Potassium 01/17/2021 4.7  3.5 - 5.2 mmol/L Final   Chloride 01/17/2021 103  96 - 106 mmol/L Final   CO2 01/17/2021 22  20 - 29 mmol/L Final   Calcium 01/17/2021 9.4  8.7 - 10.2 mg/dL Final   Total Protein 01/17/2021 7.0  6.0 - 8.5 g/dL Final   Albumin 01/17/2021 4.2  3.8 - 4.9 g/dL Final   Globulin, Total 01/17/2021 2.8  1.5 - 4.5 g/dL Final   Albumin/Globulin Ratio 01/17/2021 1.5  1.2 - 2.2 Final   Bilirubin Total 01/17/2021 <0.2  0.0 - 1.2 mg/dL Final   Alkaline Phosphatase 01/17/2021 104  44 - 121 IU/L Final   AST 01/17/2021 22  0 - 40 IU/L Final   ALT 01/17/2021 31  0 - 44 IU/L Final   Vit D, 25-Hydroxy 01/17/2021 14.4 (L)  30.0 - 100.0  ng/mL Final   Comment: Vitamin D deficiency has been defined  by the [place] practice guideline as a level of serum 25-OH vitamin D less than 20 ng/mL (1,2). The Endocrine Society went on to further define vitamin D insufficiency as a level between 21 and 29 ng/mL (2). 1. IOM (Institute of Medicine). 2010. Dietary reference    intakes for calcium and D. [place]: The    [employer]. 2. Holick MF, Binkley Malott, Bischoff-Ferrari HA, et al.    Evaluation, treatment, and prevention of vitamin D    deficiency: an Endocrine Society clinical practice    guideline. JCEM. 2011 Jul; 96(7):1911-30.   Admission on 01/07/2021, Discharged on 01/11/2021  Component Date Value Ref Range Status   SARS Coronavirus 2 by RT PCR 01/07/2021 NEGATIVE  NEGATIVE Final   Comment: (NOTE) SARS-CoV-2 target nucleic acids are NOT DETECTED.  The SARS-CoV-2 RNA is generally detectable in upper respiratory specimens during the acute phase of infection. The lowest concentration of SARS-CoV-2 viral copies this assay can detect is 138 copies/mL. A negative result does not preclude SARS-Cov-2 infection and should not be used as the sole basis for treatment or other patient management decisions. A negative result may occur with  improper specimen collection/handling, submission of specimen other than nasopharyngeal swab, presence of viral mutation(s) within the areas targeted by this assay, and inadequate number of viral copies(<138 copies/mL). A negative result must be combined with clinical observations, patient history, and epidemiological information. The expected result is Negative.  Fact Sheet for Patients:  EntrepreneurPulse.com.au  Fact Sheet for Healthcare Providers:  IncredibleEmployment.be  This test is no                          t yet approved or cleared by the [place] FDA and  has been authorized for detection and/or diagnosis of SARS-CoV-2 by FDA under an Emergency  Use Authorization (EUA). This EUA will remain  in effect (meaning this test can be used) for the duration of the COVID-19 declaration under Section 564(b)(1) of the Act, 21 U.S.C.section 360bbb-3(b)(1), unless the authorization is terminated  or revoked sooner.       Influenza A by PCR 01/07/2021 NEGATIVE  NEGATIVE Final   Influenza B by PCR 01/07/2021 NEGATIVE  NEGATIVE Final   Comment: (NOTE) The Xpert Xpress SARS-CoV-2/FLU/RSV plus assay is intended as an aid in the diagnosis of influenza from Nasopharyngeal swab specimens and should not be used as a sole basis for treatment. Nasal washings and aspirates are unacceptable for Xpert Xpress SARS-CoV-2/FLU/RSV testing.  Fact Sheet for Patients: EntrepreneurPulse.com.au  Fact Sheet for Healthcare Providers: IncredibleEmployment.be  This test is not yet approved or cleared by the [place] FDA and has been authorized for detection and/or diagnosis of SARS-CoV-2 by FDA under an Emergency Use Authorization (EUA). This EUA will remain in effect (meaning this test can be used) for the duration of the COVID-19 declaration under Section 564(b)(1) of the Act, 21 U.S.C. section 360bbb-3(b)(1), unless the authorization is terminated or revoked.  Performed at [facility], [place] [address], [place], [name] [address]    WBC 01/07/2021 10.5  4.0 - 10.5 K/uL Final   RBC 01/07/2021 5.19  4.22 - 5.81 MIL/uL Final   Hemoglobin 01/07/2021 15.4  13.0 - 17.0 g/dL Final   HCT 01/07/2021 47.4  39.0 - 52.0 % Final   MCV 01/07/2021 91.3  80.0 - 100.0 fL  Final   MCH 01/07/2021 29.7  26.0 - 34.0 pg Final   MCHC 01/07/2021 32.5  30.0 - 36.0 g/dL Final   RDW 01/07/2021 13.1  11.5 - 15.5 % Final   Platelets 01/07/2021 193  150 - 400 K/uL Final   nRBC 01/07/2021 0.0  0.0 - 0.2 % Final   Neutrophils Relative % 01/07/2021 71  % Final   Neutro Abs 01/07/2021 7.4  1.7 - 7.7 K/uL Final   Lymphocytes Relative  01/07/2021 22  % Final   Lymphs Abs 01/07/2021 2.3  0.7 - 4.0 K/uL Final   Monocytes Relative 01/07/2021 7  % Final   Monocytes Absolute 01/07/2021 0.7  0.1 - 1.0 K/uL Final   Eosinophils Relative 01/07/2021 0  % Final   Eosinophils Absolute 01/07/2021 0.0  0.0 - 0.5 K/uL Final   Basophils Relative 01/07/2021 0  % Final   Basophils Absolute 01/07/2021 0.0  0.0 - 0.1 K/uL Final   Immature Granulocytes 01/07/2021 0  % Final   Abs Immature Granulocytes 01/07/2021 0.03  0.00 - 0.07 K/uL Final   Performed at [facility], [place] [address] [place], [name] [address]   Sodium 01/07/2021 133 (L)  135 - 145 mmol/L Final   Potassium 01/07/2021 4.1  3.5 - 5.1 mmol/L Final   Chloride 01/07/2021 98  98 - 111 mmol/L Final   CO2 01/07/2021 24  22 - 32 mmol/L Final   Glucose, Bld 01/07/2021 103 (H)  70 - 99 mg/dL Final   Glucose reference range applies only to samples taken after fasting for at least 8 hours.   BUN 01/07/2021 19  6 - 20 mg/dL Final   Creatinine, Ser 01/07/2021 1.26 (H)  0.61 - 1.24 mg/dL Final   Calcium 01/07/2021 9.9  8.9 - 10.3 mg/dL Final   Total Protein 01/07/2021 8.4 (H)  6.5 - 8.1 g/dL Final   Albumin 01/07/2021 4.8  3.5 - 5.0 g/dL Final   AST 01/07/2021 102 (H)  15 - 41 U/L Final   ALT 01/07/2021 57 (H)  0 - 44 U/L Final   Alkaline Phosphatase 01/07/2021 129 (H)  38 - 126 U/L Final   Total Bilirubin 01/07/2021 0.8  0.3 - 1.2 mg/dL Final   GFR, Estimated 01/07/2021 >60  >60 mL/min Final   Comment: (NOTE) Calculated using the CKD-EPI Creatinine Equation (2021)    Anion gap 01/07/2021 11  5 - 15 Final   Performed at [facility] [address] [place], [name] [address]   Alcohol, Ethyl (B) 01/07/2021 <10  <10 mg/dL Final   Comment: (NOTE) Lowest detectable limit for serum alcohol is 10 mg/dL.  For medical purposes only. Performed at [facility], [place] [address], [place], [place] [address]    Cholesterol 01/07/2021 266 (H)  0 - 200 mg/dL Final    Triglycerides 01/07/2021 81  <150 mg/dL Final   HDL 01/07/2021 59  >40 mg/dL Final   Total CHOL/HDL Ratio 01/07/2021 4.5  RATIO Final   VLDL 01/07/2021 16  0 - 40 mg/dL Final   LDL Cholesterol 01/07/2021 191 (H)  0 - 99 mg/dL Final   Comment:        Total Cholesterol/HDL:CHD Risk Coronary Heart Disease Risk Table                     Men   Women  1/2 Average Risk   3.4   3.3  Average Risk  5.0   4.4  2 X Average Risk   9.6   7.1  3 X Average Risk  23.4   11.0        Use the calculated Patient Ratio above and the CHD Risk Table to determine the patient's CHD Risk.        ATP III CLASSIFICATION (LDL):  <100     mg/dL   Optimal  100-129  mg/dL   Near or Above                    Optimal  130-159  mg/dL   Borderline  160-189  mg/dL   High  >190     mg/dL   Very High Performed at [facility] [address], [place], [name] [address]    POC Amphetamine UR 01/07/2021 None Detected  NONE DETECTED (Cut Off Level 1000 ng/mL) Final   POC Secobarbital (BAR) 01/07/2021 None Detected  NONE DETECTED (Cut Off Level 300 ng/mL) Final   POC Buprenorphine (BUP) 01/07/2021 None Detected  NONE DETECTED (Cut Off Level 10 ng/mL) Final   POC Oxazepam (BZO) 01/07/2021 None Detected  NONE DETECTED (Cut Off Level 300 ng/mL) Final   POC Cocaine UR 01/07/2021 Positive (A)  NONE DETECTED (Cut Off Level 300 ng/mL) Final   POC Methamphetamine UR 01/07/2021 None Detected  NONE DETECTED (Cut Off Level 1000 ng/mL) Final   POC Morphine 01/07/2021 None Detected  NONE DETECTED (Cut Off Level 300 ng/mL) Final   POC Oxycodone UR 01/07/2021 None Detected  NONE DETECTED (Cut Off Level 100 ng/mL) Final   POC Methadone UR 01/07/2021 None Detected  NONE DETECTED (Cut Off Level 300 ng/mL) Final   POC Marijuana UR 01/07/2021 None Detected  NONE DETECTED (Cut Off Level 50 ng/mL) Final   SARSCOV2ONAVIRUS 2 AG 01/07/2021 NEGATIVE  NEGATIVE Final   Comment: (NOTE) SARS-CoV-2 antigen NOT DETECTED.   Negative results  are presumptive.  Negative results do not preclude SARS-CoV-2 infection and should not be used as the sole basis for treatment or other patient management decisions, including infection  control decisions, particularly in the presence of clinical signs and  symptoms consistent with COVID-19, or in those who have been in contact with the virus.  Negative results must be combined with clinical observations, patient history, and epidemiological information. The expected result is Negative.  Fact Sheet for Patients: HandmadeRecipes.com.cy  Fact Sheet for Healthcare Providers: FuneralLife.at  This test is not yet approved or cleared by the [place] FDA and  has been authorized for detection and/or diagnosis of SARS-CoV-2 by FDA under an Emergency Use Authorization (EUA).  This EUA will remain in effect (meaning this test can be used) for the duration of  the COV                          ID-19 declaration under Section 564(b)(1) of the Act, 21 U.S.C. section 360bbb-3(b)(1), unless the authorization is terminated or revoked sooner.     SARS Coronavirus 2 Ag 01/07/2021 Negative  Negative Preliminary   Sodium 01/08/2021 137  135 - 145 mmol/L Final   Potassium 01/08/2021 3.7  3.5 - 5.1 mmol/L Final   Chloride 01/08/2021 103  98 - 111 mmol/L Final   CO2 01/08/2021 23  22 - 32 mmol/L Final   Glucose, Bld 01/08/2021 123 (H)  70 - 99 mg/dL Final   Glucose reference range applies only to samples taken after fasting for at least  8 hours.   BUN 01/08/2021 22 (H)  6 - 20 mg/dL Final   Creatinine, Ser 01/08/2021 1.19  0.61 - 1.24 mg/dL Final   Calcium 01/08/2021 9.4  8.9 - 10.3 mg/dL Final   Total Protein 01/08/2021 7.2  6.5 - 8.1 g/dL Final   Albumin 01/08/2021 4.0  3.5 - 5.0 g/dL Final   AST 01/08/2021 109 (H)  15 - 41 U/L Final   ALT 01/08/2021 56 (H)  0 - 44 U/L Final   Alkaline Phosphatase 01/08/2021 105  38 - 126 U/L Final   Total Bilirubin  01/08/2021 0.9  0.3 - 1.2 mg/dL Final   GFR, Estimated 01/08/2021 >60  >60 mL/min Final   Comment: (NOTE) Calculated using the CKD-EPI Creatinine Equation (2021)    Anion gap 01/08/2021 11  5 - 15 Final   Performed at [facility] [address] [place], [place] [address]  Appointment on 01/06/2021  Component Date Value Ref Range Status   CD4 T Cell Abs 01/06/2021 910  400 - 1,790 /uL Final   CD4 % Helper T Cell 01/06/2021 36  33 - 65 % Final   Performed at [facility], [place] [address], [place], [place] [address]   HIV 1 RNA Quant 01/06/2021 <20 (H)  Copies/mL Final   HIV-1 RNA Detected   HIV-1 RNA Quant, Log 01/06/2021 <1.30 (H)  Log cps/mL Final   Comment: HIV-1 RNA Detected . HIV-1 RNA was detected below 20 copies/mL. Viral nucleic acid detected below this level cannot be quantified by the assay. . . Reference Range:                           Not Detected     copies/mL                           Not Detected Log copies/mL . Marland Kitchen The test was performed using Real-Time Polymerase Chain Reaction. . . Reportable Range: 20 copies/mL to 10,000,000 copies/mL (1.30 Log copies/mL to 7.00 Log copies/mL). .   Admission on 01/02/2021, Discharged on 01/03/2021  Component Date Value Ref Range Status   Lipase 01/03/2021 44  11 - 51 U/L Final   Performed at [facility], [place] [address], [place], [name] [address]   Sodium 01/03/2021 137  135 - 145 mmol/L Final   Potassium 01/03/2021 4.2  3.5 - 5.1 mmol/L Final   Chloride 01/03/2021 101  98 - 111 mmol/L Final   CO2 01/03/2021 30  22 - 32 mmol/L Final   Glucose, Bld 01/03/2021 123 (H)  70 - 99 mg/dL Final   Glucose reference range applies only to samples taken after fasting for at least 8 hours.   BUN 01/03/2021 12  6 - 20 mg/dL Final   Creatinine, Ser 01/03/2021 1.12  0.61 - 1.24 mg/dL Final   Calcium 01/03/2021 10.0  8.9 - 10.3 mg/dL Final   Total Protein 01/03/2021 7.6  6.5 - 8.1 g/dL Final    Albumin 01/03/2021 4.1  3.5 - 5.0 g/dL Final   AST 01/03/2021 27  15 - 41 U/L Final   ALT 01/03/2021 41  0 - 44 U/L Final   Alkaline Phosphatase 01/03/2021 110  38 - 126 U/L Final   Total Bilirubin 01/03/2021 0.5  0.3 - 1.2 mg/dL Final   GFR, Estimated 01/03/2021 >60  >60 mL/min Final   Comment: (NOTE) Calculated using the  CKD-EPI Creatinine Equation (2021)    Anion gap 01/03/2021 6  5 - 15 Final   Performed at [facility], [place] [address] [place], [name] [address]   WBC 01/03/2021 10.4  4.0 - 10.5 K/uL Final   RBC 01/03/2021 5.04  4.22 - 5.81 MIL/uL Final   Hemoglobin 01/03/2021 15.4  13.0 - 17.0 g/dL Final   HCT 01/03/2021 46.0  39.0 - 52.0 % Final   MCV 01/03/2021 91.3  80.0 - 100.0 fL Final   MCH 01/03/2021 30.6  26.0 - 34.0 pg Final   MCHC 01/03/2021 33.5  30.0 - 36.0 g/dL Final   RDW 01/03/2021 12.8  11.5 - 15.5 % Final   Platelets 01/03/2021 231  150 - 400 K/uL Final   nRBC 01/03/2021 0.0  0.0 - 0.2 % Final   Performed at [facility] [address] [place], [name] [address]   Color, Urine 01/03/2021 YELLOW  YELLOW Final   APPearance 01/03/2021 HAZY (A)  CLEAR Final   Specific Gravity, Urine 01/03/2021 1.015  1.005 - 1.030 Final   pH 01/03/2021 8.0  5.0 - 8.0 Final   Glucose, UA 01/03/2021 NEGATIVE  NEGATIVE mg/dL Final   Hgb urine dipstick 01/03/2021 NEGATIVE  NEGATIVE Final   Bilirubin Urine 01/03/2021 NEGATIVE  NEGATIVE Final   Ketones, ur 01/03/2021 NEGATIVE  NEGATIVE mg/dL Final   Protein, ur 01/03/2021 NEGATIVE  NEGATIVE mg/dL Final   Nitrite 01/03/2021 NEGATIVE  NEGATIVE Final   Leukocytes,Ua 01/03/2021 NEGATIVE  NEGATIVE Final   Comment: Microscopic not done on urines with negative protein, blood, leukocytes, nitrite, or glucose < 500 mg/dL. Performed at [facility], [place] [address], [place], [place] [address]   Admission on 10/25/2020, Discharged on 10/31/2020  Component Date Value Ref Range Status   SARS Coronavirus 2 by RT PCR 10/26/2020  NEGATIVE  NEGATIVE Final   Comment: (NOTE) SARS-CoV-2 target nucleic acids are NOT DETECTED.  The SARS-CoV-2 RNA is generally detectable in upper respiratory specimens during the acute phase of infection. The lowest concentration of SARS-CoV-2 viral copies this assay can detect is 138 copies/mL. A negative result does not preclude SARS-Cov-2 infection and should not be used as the sole basis for treatment or other patient management decisions. A negative result may occur with  improper specimen collection/handling, submission of specimen other than nasopharyngeal swab, presence of viral mutation(s) within the areas targeted by this assay, and inadequate number of viral copies(<138 copies/mL). A negative result must be combined with clinical observations, patient history, and epidemiological information. The expected result is Negative.  Fact Sheet for Patients:  EntrepreneurPulse.com.au  Fact Sheet for Healthcare Providers:  IncredibleEmployment.be  This test is no                          t yet approved or cleared by the [place] FDA and  has been authorized for detection and/or diagnosis of SARS-CoV-2 by FDA under an Emergency Use Authorization (EUA). This EUA will remain  in effect (meaning this test can be used) for the duration of the COVID-19 declaration under Section 564(b)(1) of the Act, 21 U.S.C.section 360bbb-3(b)(1), unless the authorization is terminated  or revoked sooner.       Influenza A by PCR 10/26/2020 NEGATIVE  NEGATIVE Final   Influenza B by PCR 10/26/2020 NEGATIVE  NEGATIVE Final   Comment: (NOTE) The Xpert Xpress SARS-CoV-2/FLU/RSV plus assay is intended as an aid in the diagnosis of influenza  from Nasopharyngeal swab specimens and should not be used as a sole basis for treatment. Nasal washings and aspirates are unacceptable for Xpert Xpress SARS-CoV-2/FLU/RSV testing.  Fact Sheet for  Patients: EntrepreneurPulse.com.au  Fact Sheet for Healthcare Providers: IncredibleEmployment.be  This test is not yet approved or cleared by the [place] FDA and has been authorized for detection and/or diagnosis of SARS-CoV-2 by FDA under an Emergency Use Authorization (EUA). This EUA will remain in effect (meaning this test can be used) for the duration of the COVID-19 declaration under Section 564(b)(1) of the Act, 21 U.S.C. section 360bbb-3(b)(1), unless the authorization is terminated or revoked.  Performed at [facility], [place] [address], [place], [name] [address]    SARS Coronavirus 2 Ag 10/26/2020 Negative  Negative Preliminary   WBC 10/26/2020 7.7  4.0 - 10.5 K/uL Final   RBC 10/26/2020 4.58  4.22 - 5.81 MIL/uL Final   Hemoglobin 10/26/2020 14.1  13.0 - 17.0 g/dL Final   HCT 10/26/2020 41.4  39.0 - 52.0 % Final   MCV 10/26/2020 90.4  80.0 - 100.0 fL Final   MCH 10/26/2020 30.8  26.0 - 34.0 pg Final   MCHC 10/26/2020 34.1  30.0 - 36.0 g/dL Final   RDW 10/26/2020 12.6  11.5 - 15.5 % Final   Platelets 10/26/2020 218  150 - 400 K/uL Final   nRBC 10/26/2020 0.0  0.0 - 0.2 % Final   Neutrophils Relative % 10/26/2020 58  % Final   Neutro Abs 10/26/2020 4.4  1.7 - 7.7 K/uL Final   Lymphocytes Relative 10/26/2020 36  % Final   Lymphs Abs 10/26/2020 2.8  0.7 - 4.0 K/uL Final   Monocytes Relative 10/26/2020 5  % Final   Monocytes Absolute 10/26/2020 0.4  0.1 - 1.0 K/uL Final   Eosinophils Relative 10/26/2020 1  % Final   Eosinophils Absolute 10/26/2020 0.1  0.0 - 0.5 K/uL Final   Basophils Relative 10/26/2020 0  % Final   Basophils Absolute 10/26/2020 0.0  0.0 - 0.1 K/uL Final   Immature Granulocytes 10/26/2020 0  % Final   Abs Immature Granulocytes 10/26/2020 0.00  0.00 - 0.07 K/uL Final   Performed at [facility], [place] [address] [place], [name] [address]   Sodium 10/26/2020 136  135 - 145 mmol/L Final   Potassium  10/26/2020 3.8  3.5 - 5.1 mmol/L Final   Chloride 10/26/2020 105  98 - 111 mmol/L Final   CO2 10/26/2020 21 (L)  22 - 32 mmol/L Final   Glucose, Bld 10/26/2020 151 (H)  70 - 99 mg/dL Final   Glucose reference range applies only to samples taken after fasting for at least 8 hours.   BUN 10/26/2020 15  6 - 20 mg/dL Final   Creatinine, Ser 10/26/2020 1.16  0.61 - 1.24 mg/dL Final   Calcium 10/26/2020 9.7  8.9 - 10.3 mg/dL Final   Total Protein 10/26/2020 6.9  6.5 - 8.1 g/dL Final   Albumin 10/26/2020 4.1  3.5 - 5.0 g/dL Final   AST 10/26/2020 34  15 - 41 U/L Final   ALT 10/26/2020 38  0 - 44 U/L Final   Alkaline Phosphatase 10/26/2020 91  38 - 126 U/L Final   Total Bilirubin 10/26/2020 0.7  0.3 - 1.2 mg/dL Final   GFR, Estimated 10/26/2020 >60  >60 mL/min Final   Comment: (NOTE) Calculated using the CKD-EPI Creatinine Equation (2021)    Anion gap 10/26/2020 10  5 - 15 Final  Performed at [facility], [place] [address] [place], [name] [address]   Hgb A1c MFr Bld 10/26/2020 6.0 (H)  4.8 - 5.6 % Final   Comment: (NOTE) Pre diabetes:          5.7%-6.4%  Diabetes:              >6.4%  Glycemic control for   <7.0% adults with diabetes    Mean Plasma Glucose 10/26/2020 125.5  mg/dL Final   Performed at [facility], [place] [address], [place], [place] [address]   Alcohol, Ethyl (B) 10/26/2020 <10  <10 mg/dL Final   Comment: (NOTE) Lowest detectable limit for serum alcohol is 10 mg/dL.  For medical purposes only. Performed at [facility], [place] [address] [place], [place] [address]    Cholesterol 10/26/2020 153  0 - 200 mg/dL Final   Triglycerides 10/26/2020 80  <150 mg/dL Final   HDL 10/26/2020 43  >40 mg/dL Final   Total CHOL/HDL Ratio 10/26/2020 3.6  RATIO Final   VLDL 10/26/2020 16  0 - 40 mg/dL Final   LDL Cholesterol 10/26/2020 94  0 - 99 mg/dL Final   Comment:        Total Cholesterol/HDL:CHD Risk Coronary Heart Disease Risk Table                     Men    Women  1/2 Average Risk   3.4   3.3  Average Risk       5.0   4.4  2 X Average Risk   9.6   7.1  3 X Average Risk  23.4   11.0        Use the calculated Patient Ratio above and the CHD Risk Table to determine the patient's CHD Risk.        ATP III CLASSIFICATION (LDL):  <100     mg/dL   Optimal  100-129  mg/dL   Near or Above                    Optimal  130-159  mg/dL   Borderline  160-189  mg/dL   High  >190     mg/dL   Very High Performed at [place] [address], [place], [place] [address]    TSH 10/26/2020 0.512  0.350 - 4.500 uIU/mL Final   Comment: Performed by a 3rd Generation assay with a functional sensitivity of <=0.01 uIU/mL. Performed at [facility], [place] [address] [place], [name] [address]    POC Amphetamine UR 10/26/2020 None Detected  NONE DETECTED (Cut Off Level 1000 ng/mL) Final   POC Secobarbital (BAR) 10/26/2020 None Detected  NONE DETECTED (Cut Off Level 300 ng/mL) Final   POC Buprenorphine (BUP) 10/26/2020 None Detected  NONE DETECTED (Cut Off Level 10 ng/mL) Final   POC Oxazepam (BZO) 10/26/2020 Positive (A)  NONE DETECTED (Cut Off Level 300 ng/mL) Final   POC Cocaine UR 10/26/2020 Positive (A)  NONE DETECTED (Cut Off Level 300 ng/mL) Final   POC Methamphetamine UR 10/26/2020 None Detected  NONE DETECTED (Cut Off Level 1000 ng/mL) Final   POC Morphine 10/26/2020 None Detected  NONE DETECTED (Cut Off Level 300 ng/mL) Final   POC Oxycodone UR 10/26/2020 None Detected  NONE DETECTED (Cut Off Level 100 ng/mL) Final   POC Methadone UR 10/26/2020 None Detected  NONE DETECTED (Cut Off Level 300 ng/mL) Final   POC Marijuana UR 10/26/2020  None Detected  NONE DETECTED (Cut Off Level 50 ng/mL) Final   Group A Strep by PCR 10/30/2020 NOT DETECTED  NOT DETECTED Final   Performed at [facility], [place] [address] [place], [place] [address]    Allergies: Penicillins, Latex, and Tape  PTA Medications: (Not in a hospital admission)   Medical  Decision Making  Patient admitted to the continuous assessment for overnight observation for safety, mood stabilization, and restart psychotropics. Patient to be re-evaluated on 03/14/2021. Patient will need a scheduled appointment for [facility] outpatient prior to discharge.   Labs:  Lab Orders         Resp Panel by RT-PCR (Flu A&B, Covid) Nasopharyngeal Swab         CBC with Differential/Platelet         Comprehensive metabolic panel         Hemoglobin A1c         Ethanol         Lipid panel         TSH         POCT Urine Drug Screen - ([facility]up)         POC SARS Coronavirus 2 Ag-ED - Nasal Swab         POC SARS Coronavirus 2 Ag    EKG  Medications: (adjustments made due to patient being off medications).   Restart Cymbalta at 30 mg p.o. daily  Restart home med Biktarvy 50/200/25 mg per 1 tablet daily for HIV Restart gabapentin at 300 mg p.o. twice daily Restart Seroquel at 100 mg p.o. nightly Restart Crestor 20 mg p.o. daily Restart trazodone at 50 mg p.o. nightly as needed for sleep  Recommendations  Based on my evaluation the patient does not appear to have an emergency medical condition.  Thurza Kwiecinski L, NP 03/13/21  8:12 AM

## 2021-03-13 NOTE — ED Notes (Signed)
Pt awake and alert he has flat affect but good eye contact. Pt sitting up in bed. Reports that he has been having thoughts of self harm. Denies SI or AVH at this time.  Pt was searched with no contraband found. Pt given breakfast.  No distress noted.   ?

## 2021-03-13 NOTE — ED Notes (Signed)
Pt is currently sleeping, no distress noted, environmental check complete, will continue to monitor patient for safety. ? ?

## 2021-03-13 NOTE — BH Assessment (Signed)
Dylan [name], Urgent; 56 years old presents voluntarily to [facility] via Event organiser and unaccompanied.  Pt reports SI with a plan to use a double barrel shotgun to blow his head off. Pt reports that he is hearing voices, "the voices are telling me to cut my heart out with scissors".  Pt denies HI.  Pt admits to [place] diagnosis; also, reports currently not taken medication.  MSE signed by patient. ?

## 2021-03-13 NOTE — ED Notes (Signed)
Pt has been having verbal outburst stating "I can't walk"..."somebody cut off my legs".  Pt is redirectable and compliant with taking prescribed medication.  He is curretly laying down.  Staff will continue to monitor for safety.  ?

## 2021-03-14 NOTE — ED Notes (Signed)
Patient sitting on bed. Voices no concerns. Monitoring continues. ?

## 2021-03-14 NOTE — ED Notes (Signed)
Pt is currently sleeping, no distress noted, environmental check complete, will continue to monitor patient for safety. ? ?

## 2021-03-14 NOTE — Progress Notes (Signed)
Patient has been denied by [facility] and [facility] due to no appropriate beds available. Patient meets [facility] inpatient criteria per Dr. Serafina Mitchell. Patient has been faxed out to the following facilities:  ? ?[facility]  [address] [name] [address] [phone]  ?[facility]  [address], [name] [address] [phone]  ?[place]  [address] [place] [address] [phone]  ?[place] [name] [address] [phone]  ?[facility]  [place], [place] [address] [phone]  ?[facility]  [place], [name] [address] [phone]  ?[place] [name] [address] [phone]  ?[facility]  [address] [name] [address] [phone]  ?[facility]  [address] [place] [address] [phone]  ? ?Mariea Clonts, MSW, LCSW-A  ?2:48 PM 03/14/2021   ?

## 2021-03-14 NOTE — ED Notes (Signed)
Patient resting on bed with eyes closed. Respirations even and non labored. No distress noted. Monitoring continues. ?

## 2021-03-14 NOTE — ED Notes (Signed)
Patient alert and verbal. Patient denies SI/HI and A/V/H. Patient voices no concerns. He interacts with peers and staff appropriately. Monitoring continues. ?

## 2021-03-14 NOTE — ED Provider Notes (Signed)
Behavioral Health Progress Note  Date and Time: 03/14/2021 7:24 PM Name: Dylan [name] MRN:  [id number]  Subjective:   Dylan [name], 56 y.o., male patient who initially presents to [facility] on 03/13/2021 with suicidal ideations with a plan to shoot himself with a double barrel shotgun.  Recommendation was made by Dr. Leverne Humbles for inpatient psychiatric treatment.  Patient seen face to face by this provider, Dr. Serafina Mitchell; and  chart reviewed on 03/14/21.   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  He has been drinking 4 shots of liquor daily and uses cocaine once.  He endorses daily nicotine use.  Upon admission patient's BAL less than 10.  UDS positive for cocaine.  Patient's home medications: Biktarvy, Cymbalta, gabapentin, Seroquel, and Crestor were restarted upon admission and he is tolerating without any adverse reactions.  He denies any alcohol withdrawal symptoms.  He denies any seizure or DTs from alcohol withdrawal.  During evaluation Dylan [name] is laying in his bed asleep.  He is easily awakened.  He is alert/oriented x4. Patient is speaking in a clear tone at moderate volume, and normal pace; with fair eye contact.  He continues to endorse suicidal ideations.  When asked about a specific plan he states, "overdose would probably be the easiest way".  He cannot contract for safety.  He denies homicidal ideations.  He endorses auditory hallucinations of hearing a voice that tells him to hurt himself.  He denies visual hallucinations.  Discussed inpatient psychiatric admission with patient.  He is in agreement.     Diagnosis:  Final diagnoses:  None    Total Time spent with patient: 30 minutes  Past Psychiatric History: See H&P Past Medical History:  Past Medical History:  Diagnosis Date   Anxiety    Arthritis    Bipolar 1 disorder ([place])    Colon polyps    Depression    GERD (gastroesophageal reflux disease)    Hepatitis B    Human immunodeficiency  virus (HIV) ([place])    Hyperlipidemia    Hypertension    Neuromuscular disorder ([place])    neuropathy   Neuropathy    Pre-diabetes    Schizophrenia ([place])     Past Surgical History:  Procedure Laterality Date   FOOT ARTHRODESIS Right 09/17/2019   Procedure: FUSION RIGHT LISFRANC JOINT;  Surgeon: Newt Minion, MD;  Location: [facility];  Service: Orthopedics;  Laterality: Right;   FOOT ARTHRODESIS Right 09/2019   HEMORROIDECTOMY     IR FLUORO GUIDE CV LINE RIGHT  02/22/2017   IR US GUIDE VASC ACCESS RIGHT  02/22/2017   TUMOR REMOVAL     From Chest   Family History:  Family History  Problem Relation Age of Onset   CAD Mother    Diabetes Father    Colon cancer Maternal Aunt 60   Diabetes Maternal Aunt    Heart disease Maternal Uncle    Esophageal cancer Neg Hx    Rectal cancer Neg Hx    Stomach cancer Neg Hx    Family Psychiatric  History: See H&P Social History:  Social History   Substance and Sexual Activity  Alcohol Use Yes   Comment: occassional use on weekends     Social History   Substance and Sexual Activity  Drug Use Yes   Types: Cocaine, Marijuana   Comment: last use was Friday 03/05/2020    Social History   Socioeconomic History   Marital status: Married    Spouse name:  Not on file   Number of children: 1   Years of education: Not on file   Highest education level: Not on file  Occupational History   Not on file  Tobacco Use   Smoking status: Every Day    Packs/day: 0.50    Years: 15.00    Pack years: 7.50    Types: E-cigarettes, Cigarettes   Smokeless tobacco: Never   Tobacco comments:    has patches when he is ready to quit  Vaping Use   Vaping Use: Some days   Substances: Nicotine  Substance and Sexual Activity   Alcohol use: Yes    Comment: occassional use on weekends   Drug use: Yes    Types: Cocaine, Marijuana    Comment: last use was Friday 03/05/2020   Sexual activity: Yes    Partners: Female    Birth control/protection: Condom     Comment: condoms given  Other Topics Concern   Not on file  Social History Narrative   Not on file   Social Determinants of Health   Financial Resource Strain: Not on file  Food Insecurity: Not on file  Transportation Needs: Not on file  Physical Activity: Not on file  Stress: Not on file  Social Connections: Not on file   SDOH:  SDOH Screenings   Alcohol Screen: Low Risk    Last Alcohol Screening Score (AUDIT): 3  Depression (PHQ2-9): Low Risk    PHQ-2 Score: 0  Financial Resource Strain: Not on file  Food Insecurity: Not on file  Housing: Not on file  Physical Activity: Not on file  Social Connections: Not on file  Stress: Not on file  Tobacco Use: High Risk   Smoking Tobacco Use: Every Day   Smokeless Tobacco Use: Never   Passive Exposure: Not on file  Transportation Needs: Not on file   Additional Social History:    Pain Medications: See MAR Prescriptions: See MAR Over the Counter: See MAR Longest period of sobriety (when/how long): 2 months Negative Consequences of Use: Financial, Legal Withdrawal Symptoms: Aggressive/Assaultive, Agitation Name of Substance 1: Alcohol 1 - Age of First Use: 13 1 - Amount (size/oz): 4 shots 1 - Frequency: daily 1 - Duration: ongoing 1 - Last Use / Amount: 03/13/21 1 - Method of Aquiring: UTA 1- Route of Use: drinking Name of Substance 2: cocaine 2 - Age of First Use: 55 2 - Amount (size/oz): UTA 2 - Frequency: UTA 2 - Duration: UTA 2 - Last Use / Amount: 03/13/21 2 - Method of Aquiring: UTA 2 - Route of Substance Use: Snort                Sleep: Fair  Appetite:  Fair  Current Medications:  Current Facility-Administered Medications  Medication Dose Route Frequency Provider Last Rate Last Admin   acetaminophen (TYLENOL) tablet 650 mg  650 mg Oral Q6H PRN White, Patrice L, NP   650 mg at 03/14/21 1016   alum & mag hydroxide-simeth (MAALOX/MYLANTA) 200-200-20 MG/5ML suspension 30 mL  30 mL Oral Q4H PRN White,  Patrice L, NP       bictegravir-emtricitabine-tenofovir AF (BIKTARVY) 50-200-25 MG per tablet 1 tablet  1 tablet Oral Daily White, Patrice L, NP   1 tablet at 03/14/21 0909   DULoxetine (CYMBALTA) DR capsule 30 mg  30 mg Oral Daily White, Patrice L, NP   30 mg at 03/14/21 0909   gabapentin (NEURONTIN) capsule 300 mg  300 mg Oral BID White, Mellody Life, NP  300 mg at 03/14/21 9892   hydrOXYzine (ATARAX) tablet 25 mg  25 mg Oral TID PRN Darrol Angel L, NP   25 mg at 03/13/21 1429   magnesium hydroxide (MILK OF MAGNESIA) suspension 30 mL  30 mL Oral Daily PRN White, Patrice L, NP       nicotine (NICODERM CQ - dosed in mg/24 hours) patch 14 mg  14 mg Transdermal Daily White, Patrice L, NP   14 mg at 03/14/21 0910   QUEtiapine (SEROQUEL) tablet 100 mg  100 mg Oral QHS White, Patrice L, NP   100 mg at 03/13/21 2125   rosuvastatin (CRESTOR) tablet 20 mg  20 mg Oral Daily White, Patrice L, NP   20 mg at 03/14/21 1194   traZODone (DESYREL) tablet 50 mg  50 mg Oral QHS PRN White, Patrice L, NP       Current Outpatient Medications  Medication Sig Dispense Refill   bictegravir-emtricitabine-tenofovir AF (BIKTARVY) 50-200-25 MG TABS tablet Take 1 tablet by mouth daily. 30 tablet 3   DULoxetine (CYMBALTA) 60 MG capsule Take 60 mg by mouth daily.     gabapentin (NEURONTIN) 300 MG capsule Take 2 capsules (600 mg total) by mouth in the morning. 30 capsule 1   gabapentin (NEURONTIN) 300 MG capsule Take 3 capsules (900 mg total) by mouth at bedtime. 30 capsule 1   hydrOXYzine (ATARAX) 25 MG tablet Take 1 tablet (25 mg total) by mouth 3 (three) times daily as needed for anxiety. 60 tablet 1   QUEtiapine (SEROQUEL) 50 MG tablet Take 5 tablets (250 mg total) by mouth daily at 6 PM. 150 tablet 1   rosuvastatin (CRESTOR) 20 MG tablet Take 1 tablet (20 mg total) by mouth daily. 30 tablet 1   traZODone (DESYREL) 150 MG tablet Take 1 tablet (150 mg total) by mouth at bedtime. 30 tablet 1    Labs  Lab Results:   Admission on 03/13/2021  Component Date Value Ref Range Status   SARS Coronavirus 2 by RT PCR 03/13/2021 NEGATIVE  NEGATIVE Final   Comment: (NOTE) SARS-CoV-2 target nucleic acids are NOT DETECTED.  The SARS-CoV-2 RNA is generally detectable in upper respiratory specimens during the acute phase of infection. The lowest concentration of SARS-CoV-2 viral copies this assay can detect is 138 copies/mL. A negative result does not preclude SARS-Cov-2 infection and should not be used as the sole basis for treatment or other patient management decisions. A negative result may occur with  improper specimen collection/handling, submission of specimen other than nasopharyngeal swab, presence of viral mutation(s) within the areas targeted by this assay, and inadequate number of viral copies(<138 copies/mL). A negative result must be combined with clinical observations, patient history, and epidemiological information. The expected result is Negative.  Fact Sheet for Patients:  EntrepreneurPulse.com.au  Fact Sheet for Healthcare Providers:  IncredibleEmployment.be  This test is no                          t yet approved or cleared by the [place] FDA and  has been authorized for detection and/or diagnosis of SARS-CoV-2 by FDA under an Emergency Use Authorization (EUA). This EUA will remain  in effect (meaning this test can be used) for the duration of the COVID-19 declaration under Section 564(b)(1) of the Act, 21 U.S.C.section 360bbb-3(b)(1), unless the authorization is terminated  or revoked sooner.       Influenza A by PCR 03/13/2021 NEGATIVE  NEGATIVE Final  Influenza B by PCR 03/13/2021 NEGATIVE  NEGATIVE Final   Comment: (NOTE) The Xpert Xpress SARS-CoV-2/FLU/RSV plus assay is intended as an aid in the diagnosis of influenza from Nasopharyngeal swab specimens and should not be used as a sole basis for treatment. Nasal washings  and aspirates are unacceptable for Xpert Xpress SARS-CoV-2/FLU/RSV testing.  Fact Sheet for Patients: EntrepreneurPulse.com.au  Fact Sheet for Healthcare Providers: IncredibleEmployment.be  This test is not yet approved or cleared by the [place] FDA and has been authorized for detection and/or diagnosis of SARS-CoV-2 by FDA under an Emergency Use Authorization (EUA). This EUA will remain in effect (meaning this test can be used) for the duration of the COVID-19 declaration under Section 564(b)(1) of the Act, 21 U.S.C. section 360bbb-3(b)(1), unless the authorization is terminated or revoked.  Performed at [facility], [place] [address], [place], [name] [address]    WBC 03/13/2021 12.7 (H)  4.0 - 10.5 K/uL Final   RBC 03/13/2021 5.19  4.22 - 5.81 MIL/uL Final   Hemoglobin 03/13/2021 15.7  13.0 - 17.0 g/dL Final   HCT 03/13/2021 46.9  39.0 - 52.0 % Final   MCV 03/13/2021 90.4  80.0 - 100.0 fL Final   MCH 03/13/2021 30.3  26.0 - 34.0 pg Final   MCHC 03/13/2021 33.5  30.0 - 36.0 g/dL Final   RDW 03/13/2021 13.6  11.5 - 15.5 % Final   Platelets 03/13/2021 228  150 - 400 K/uL Final   nRBC 03/13/2021 0.0  0.0 - 0.2 % Final   Neutrophils Relative % 03/13/2021 72  % Final   Neutro Abs 03/13/2021 9.1 (H)  1.7 - 7.7 K/uL Final   Lymphocytes Relative 03/13/2021 20  % Final   Lymphs Abs 03/13/2021 2.5  0.7 - 4.0 K/uL Final   Monocytes Relative 03/13/2021 8  % Final   Monocytes Absolute 03/13/2021 1.0  0.1 - 1.0 K/uL Final   Eosinophils Relative 03/13/2021 0  % Final   Eosinophils Absolute 03/13/2021 0.0  0.0 - 0.5 K/uL Final   Basophils Relative 03/13/2021 0  % Final   Basophils Absolute 03/13/2021 0.0  0.0 - 0.1 K/uL Final   Immature Granulocytes 03/13/2021 0  % Final   Abs Immature Granulocytes 03/13/2021 0.05  0.00 - 0.07 K/uL Final   Performed at [facility], [place] [address], [place], [name] [address]   Sodium 03/13/2021 135  135  - 145 mmol/L Final   Potassium 03/13/2021 4.3  3.5 - 5.1 mmol/L Final   Chloride 03/13/2021 99  98 - 111 mmol/L Final   CO2 03/13/2021 25  22 - 32 mmol/L Final   Glucose, Bld 03/13/2021 77  70 - 99 mg/dL Final   Glucose reference range applies only to samples taken after fasting for at least 8 hours.   BUN 03/13/2021 28 (H)  6 - 20 mg/dL Final   Creatinine, Ser 03/13/2021 1.07  0.61 - 1.24 mg/dL Final   Calcium 03/13/2021 10.1  8.9 - 10.3 mg/dL Final   Total Protein 03/13/2021 8.4 (H)  6.5 - 8.1 g/dL Final   Albumin 03/13/2021 5.0  3.5 - 5.0 g/dL Final   AST 03/13/2021 409 (H)  15 - 41 U/L Final   ALT 03/13/2021 101 (H)  0 - 44 U/L Final   Alkaline Phosphatase 03/13/2021 103  38 - 126 U/L Final   Total Bilirubin 03/13/2021 0.7  0.3 - 1.2 mg/dL Final   GFR, Estimated 03/13/2021 >60  >60 mL/min Final  Comment: (NOTE) Calculated using the CKD-EPI Creatinine Equation (2021)    Anion gap 03/13/2021 11  5 - 15 Final   Performed at [facility], [place] [address] [place], [name] [address]   Hgb A1c MFr Bld 03/13/2021 5.7 (H)  4.8 - 5.6 % Final   Comment: (NOTE) Pre diabetes:          5.7%-6.4%  Diabetes:              >6.4%  Glycemic control for   <7.0% adults with diabetes    Mean Plasma Glucose 03/13/2021 116.89  mg/dL Final   Performed at [facility], [place] [address] [place], [place] [address]   Alcohol, Ethyl (B) 03/13/2021 <10  <10 mg/dL Final   Comment: (NOTE) Lowest detectable limit for serum alcohol is 10 mg/dL.  For medical purposes only. Performed at [facility], [place] [address], [place], [place] [address]    TSH 03/13/2021 1.051  0.350 - 4.500 uIU/mL Final   Comment: Performed by a 3rd Generation assay with a functional sensitivity of <=0.01 uIU/mL. Performed at [facility], [place] [address], [place], [name] [address]    POC Amphetamine UR 03/13/2021 None Detected  NONE DETECTED (Cut Off Level 1000 ng/mL) Final   POC Secobarbital (BAR)  03/13/2021 None Detected  NONE DETECTED (Cut Off Level 300 ng/mL) Final   POC Buprenorphine (BUP) 03/13/2021 None Detected  NONE DETECTED (Cut Off Level 10 ng/mL) Final   POC Oxazepam (BZO) 03/13/2021 None Detected  NONE DETECTED (Cut Off Level 300 ng/mL) Final   POC Cocaine UR 03/13/2021 Positive (A)  NONE DETECTED (Cut Off Level 300 ng/mL) Final   POC Methamphetamine UR 03/13/2021 None Detected  NONE DETECTED (Cut Off Level 1000 ng/mL) Final   POC Morphine 03/13/2021 None Detected  NONE DETECTED (Cut Off Level 300 ng/mL) Final   POC Oxycodone UR 03/13/2021 None Detected  NONE DETECTED (Cut Off Level 100 ng/mL) Final   POC Methadone UR 03/13/2021 None Detected  NONE DETECTED (Cut Off Level 300 ng/mL) Final   POC Marijuana UR 03/13/2021 None Detected  NONE DETECTED (Cut Off Level 50 ng/mL) Final   SARS Coronavirus 2 Ag 03/13/2021 Negative  Negative Final   SARSCOV2ONAVIRUS 2 AG 03/13/2021 NEGATIVE  NEGATIVE Final   Comment: (NOTE) SARS-CoV-2 antigen NOT DETECTED.   Negative results are presumptive.  Negative results do not preclude SARS-CoV-2 infection and should not be used as the sole basis for treatment or other patient management decisions, including infection  control decisions, particularly in the presence of clinical signs and  symptoms consistent with COVID-19, or in those who have been in contact with the virus.  Negative results must be combined with clinical observations, patient history, and epidemiological information. The expected result is Negative.  Fact Sheet for Patients: HandmadeRecipes.com.cy  Fact Sheet for Healthcare Providers: FuneralLife.at  This test is not yet approved or cleared by the [place] FDA and  has been authorized for detection and/or diagnosis of SARS-CoV-2 by FDA under an Emergency Use Authorization (EUA).  This EUA will remain in effect (meaning this test can be used) for the duration of  the COV                           ID-19 declaration under Section 564(b)(1) of the Act, 21 U.S.C. section 360bbb-3(b)(1), unless the authorization is terminated or revoked sooner.     Cholesterol 03/13/2021 196  0 - 200 mg/dL Final   Triglycerides 03/13/2021 62  <150 mg/dL Final   HDL 03/13/2021 56  >40 mg/dL Final   Total CHOL/HDL Ratio 03/13/2021 3.5  RATIO Final   VLDL 03/13/2021 12  0 - 40 mg/dL Final   LDL Cholesterol 03/13/2021 128 (H)  0 - 99 mg/dL Final   Comment:        Total Cholesterol/HDL:CHD Risk Coronary Heart Disease Risk Table                     Men   Women  1/2 Average Risk   3.4   3.3  Average Risk       5.0   4.4  2 X Average Risk   9.6   7.1  3 X Average Risk  23.4   11.0        Use the calculated Patient Ratio above and the CHD Risk Table to determine the patient's CHD Risk.        ATP III CLASSIFICATION (LDL):  <100     mg/dL   Optimal  100-129  mg/dL   Near or Above                    Optimal  130-159  mg/dL   Borderline  160-189  mg/dL   High  >190     mg/dL   Very High Performed at [facility] [address], [place], [place] [address]   Office Visit on 01/17/2021  Component Date Value Ref Range Status   Glucose 01/17/2021 93  70 - 99 mg/dL Final   BUN 01/17/2021 12  6 - 24 mg/dL Final   Creatinine, Ser 01/17/2021 1.03  0.76 - 1.27 mg/dL Final   eGFR 01/17/2021 86  >59 mL/min/1.73 Final   BUN/Creatinine Ratio 01/17/2021 12  9 - 20 Final   Sodium 01/17/2021 139  134 - 144 mmol/L Final   Potassium 01/17/2021 4.7  3.5 - 5.2 mmol/L Final   Chloride 01/17/2021 103  96 - 106 mmol/L Final   CO2 01/17/2021 22  20 - 29 mmol/L Final   Calcium 01/17/2021 9.4  8.7 - 10.2 mg/dL Final   Total Protein 01/17/2021 7.0  6.0 - 8.5 g/dL Final   Albumin 01/17/2021 4.2  3.8 - 4.9 g/dL Final   Globulin, Total 01/17/2021 2.8  1.5 - 4.5 g/dL Final   Albumin/Globulin Ratio 01/17/2021 1.5  1.2 - 2.2 Final   Bilirubin Total 01/17/2021 <0.2  0.0 - 1.2 mg/dL Final    Alkaline Phosphatase 01/17/2021 104  44 - 121 IU/L Final   AST 01/17/2021 22  0 - 40 IU/L Final   ALT 01/17/2021 31  0 - 44 IU/L Final   Vit D, 25-Hydroxy 01/17/2021 14.4 (L)  30.0 - 100.0 ng/mL Final   Comment: Vitamin D deficiency has been defined by the [place] practice guideline as a level of serum 25-OH vitamin D less than 20 ng/mL (1,2). The Endocrine Society went on to further define vitamin D insufficiency as a level between 21 and 29 ng/mL (2). 1. IOM (Institute of Medicine). 2010. Dietary reference    intakes for calcium and D. [place]: The    [employer]. 2. Holick MF, Binkley [place], Bischoff-Ferrari HA, et al.    Evaluation, treatment, and prevention of vitamin D    deficiency: an Endocrine Society clinical practice    guideline. JCEM. 2011 Jul; 96(7):1911-30.  Admission on 01/07/2021, Discharged on 01/11/2021  Component Date Value Ref Range Status   SARS Coronavirus 2 by RT PCR 01/07/2021 NEGATIVE  NEGATIVE Final   Comment: (NOTE) SARS-CoV-2 target nucleic acids are NOT DETECTED.  The SARS-CoV-2 RNA is generally detectable in upper respiratory specimens during the acute phase of infection. The lowest concentration of SARS-CoV-2 viral copies this assay can detect is 138 copies/mL. A negative result does not preclude SARS-Cov-2 infection and should not be used as the sole basis for treatment or other patient management decisions. A negative result may occur with  improper specimen collection/handling, submission of specimen other than nasopharyngeal swab, presence of viral mutation(s) within the areas targeted by this assay, and inadequate number of viral copies(<138 copies/mL). A negative result must be combined with clinical observations, patient history, and epidemiological information. The expected result is Negative.  Fact Sheet for Patients:  EntrepreneurPulse.com.au  Fact Sheet for  Healthcare Providers:  IncredibleEmployment.be  This test is no                          t yet approved or cleared by the [place] FDA and  has been authorized for detection and/or diagnosis of SARS-CoV-2 by FDA under an Emergency Use Authorization (EUA). This EUA will remain  in effect (meaning this test can be used) for the duration of the COVID-19 declaration under Section 564(b)(1) of the Act, 21 U.S.C.section 360bbb-3(b)(1), unless the authorization is terminated  or revoked sooner.       Influenza A by PCR 01/07/2021 NEGATIVE  NEGATIVE Final   Influenza B by PCR 01/07/2021 NEGATIVE  NEGATIVE Final   Comment: (NOTE) The Xpert Xpress SARS-CoV-2/FLU/RSV plus assay is intended as an aid in the diagnosis of influenza from Nasopharyngeal swab specimens and should not be used as a sole basis for treatment. Nasal washings and aspirates are unacceptable for Xpert Xpress SARS-CoV-2/FLU/RSV testing.  Fact Sheet for Patients: EntrepreneurPulse.com.au  Fact Sheet for Healthcare Providers: IncredibleEmployment.be  This test is not yet approved or cleared by the [place] FDA and has been authorized for detection and/or diagnosis of SARS-CoV-2 by FDA under an Emergency Use Authorization (EUA). This EUA will remain in effect (meaning this test can be used) for the duration of the COVID-19 declaration under Section 564(b)(1) of the Act, 21 U.S.C. section 360bbb-3(b)(1), unless the authorization is terminated or revoked.  Performed at [facility], [place] [address], [place], [name] [address]    WBC 01/07/2021 10.5  4.0 - 10.5 K/uL Final   RBC 01/07/2021 5.19  4.22 - 5.81 MIL/uL Final   Hemoglobin 01/07/2021 15.4  13.0 - 17.0 g/dL Final   HCT 01/07/2021 47.4  39.0 - 52.0 % Final   MCV 01/07/2021 91.3  80.0 - 100.0 fL Final   MCH 01/07/2021 29.7  26.0 - 34.0 pg Final   MCHC 01/07/2021 32.5  30.0 - 36.0 g/dL Final    RDW 01/07/2021 13.1  11.5 - 15.5 % Final   Platelets 01/07/2021 193  150 - 400 K/uL Final   nRBC 01/07/2021 0.0  0.0 - 0.2 % Final   Neutrophils Relative % 01/07/2021 71  % Final   Neutro Abs 01/07/2021 7.4  1.7 - 7.7 K/uL Final   Lymphocytes Relative 01/07/2021 22  % Final   Lymphs Abs 01/07/2021 2.3  0.7 - 4.0 K/uL Final   Monocytes Relative 01/07/2021 7  % Final   Monocytes Absolute 01/07/2021 0.7  0.1 - 1.0  K/uL Final   Eosinophils Relative 01/07/2021 0  % Final   Eosinophils Absolute 01/07/2021 0.0  0.0 - 0.5 K/uL Final   Basophils Relative 01/07/2021 0  % Final   Basophils Absolute 01/07/2021 0.0  0.0 - 0.1 K/uL Final   Immature Granulocytes 01/07/2021 0  % Final   Abs Immature Granulocytes 01/07/2021 0.03  0.00 - 0.07 K/uL Final   Performed at [facility], [place] [address], [place], [name] [address]   Sodium 01/07/2021 133 (L)  135 - 145 mmol/L Final   Potassium 01/07/2021 4.1  3.5 - 5.1 mmol/L Final   Chloride 01/07/2021 98  98 - 111 mmol/L Final   CO2 01/07/2021 24  22 - 32 mmol/L Final   Glucose, Bld 01/07/2021 103 (H)  70 - 99 mg/dL Final   Glucose reference range applies only to samples taken after fasting for at least 8 hours.   BUN 01/07/2021 19  6 - 20 mg/dL Final   Creatinine, Ser 01/07/2021 1.26 (H)  0.61 - 1.24 mg/dL Final   Calcium 01/07/2021 9.9  8.9 - 10.3 mg/dL Final   Total Protein 01/07/2021 8.4 (H)  6.5 - 8.1 g/dL Final   Albumin 01/07/2021 4.8  3.5 - 5.0 g/dL Final   AST 01/07/2021 102 (H)  15 - 41 U/L Final   ALT 01/07/2021 57 (H)  0 - 44 U/L Final   Alkaline Phosphatase 01/07/2021 129 (H)  38 - 126 U/L Final   Total Bilirubin 01/07/2021 0.8  0.3 - 1.2 mg/dL Final   GFR, Estimated 01/07/2021 >60  >60 mL/min Final   Comment: (NOTE) Calculated using the CKD-EPI Creatinine Equation (2021)    Anion gap 01/07/2021 11  5 - 15 Final   Performed at [facility] [address] [place], [place] [address]   Alcohol, Ethyl (B) 01/07/2021 <10  <10  mg/dL Final   Comment: (NOTE) Lowest detectable limit for serum alcohol is 10 mg/dL.  For medical purposes only. Performed at [facility], [place] [address], [place], [place] [address]    Cholesterol 01/07/2021 266 (H)  0 - 200 mg/dL Final   Triglycerides 01/07/2021 81  <150 mg/dL Final   HDL 01/07/2021 59  >40 mg/dL Final   Total CHOL/HDL Ratio 01/07/2021 4.5  RATIO Final   VLDL 01/07/2021 16  0 - 40 mg/dL Final   LDL Cholesterol 01/07/2021 191 (H)  0 - 99 mg/dL Final   Comment:        Total Cholesterol/HDL:CHD Risk Coronary Heart Disease Risk Table                     Men   Women  1/2 Average Risk   3.4   3.3  Average Risk       5.0   4.4  2 X Average Risk   9.6   7.1  3 X Average Risk  23.4   11.0        Use the calculated Patient Ratio above and the CHD Risk Table to determine the patient's CHD Risk.        ATP III CLASSIFICATION (LDL):  <100     mg/dL   Optimal  100-129  mg/dL   Near or Above                    Optimal  130-159  mg/dL   Borderline  160-189  mg/dL   High  >190  mg/dL   Very High Performed at [facility], [place] [address], [place], [name] [address]    POC Amphetamine UR 01/07/2021 None Detected  NONE DETECTED (Cut Off Level 1000 ng/mL) Final   POC Secobarbital (BAR) 01/07/2021 None Detected  NONE DETECTED (Cut Off Level 300 ng/mL) Final   POC Buprenorphine (BUP) 01/07/2021 None Detected  NONE DETECTED (Cut Off Level 10 ng/mL) Final   POC Oxazepam (BZO) 01/07/2021 None Detected  NONE DETECTED (Cut Off Level 300 ng/mL) Final   POC Cocaine UR 01/07/2021 Positive (A)  NONE DETECTED (Cut Off Level 300 ng/mL) Final   POC Methamphetamine UR 01/07/2021 None Detected  NONE DETECTED (Cut Off Level 1000 ng/mL) Final   POC Morphine 01/07/2021 None Detected  NONE DETECTED (Cut Off Level 300 ng/mL) Final   POC Oxycodone UR 01/07/2021 None Detected  NONE DETECTED (Cut Off Level 100 ng/mL) Final   POC Methadone UR 01/07/2021 None Detected  NONE DETECTED  (Cut Off Level 300 ng/mL) Final   POC Marijuana UR 01/07/2021 None Detected  NONE DETECTED (Cut Off Level 50 ng/mL) Final   SARSCOV2ONAVIRUS 2 AG 01/07/2021 NEGATIVE  NEGATIVE Final   Comment: (NOTE) SARS-CoV-2 antigen NOT DETECTED.   Negative results are presumptive.  Negative results do not preclude SARS-CoV-2 infection and should not be used as the sole basis for treatment or other patient management decisions, including infection  control decisions, particularly in the presence of clinical signs and  symptoms consistent with COVID-19, or in those who have been in contact with the virus.  Negative results must be combined with clinical observations, patient history, and epidemiological information. The expected result is Negative.  Fact Sheet for Patients: HandmadeRecipes.com.cy  Fact Sheet for Healthcare Providers: FuneralLife.at  This test is not yet approved or cleared by the [place] FDA and  has been authorized for detection and/or diagnosis of SARS-CoV-2 by FDA under an Emergency Use Authorization (EUA).  This EUA will remain in effect (meaning this test can be used) for the duration of  the COV                          ID-19 declaration under Section 564(b)(1) of the Act, 21 U.S.C. section 360bbb-3(b)(1), unless the authorization is terminated or revoked sooner.     SARS Coronavirus 2 Ag 01/07/2021 Negative  Negative Preliminary   Sodium 01/08/2021 137  135 - 145 mmol/L Final   Potassium 01/08/2021 3.7  3.5 - 5.1 mmol/L Final   Chloride 01/08/2021 103  98 - 111 mmol/L Final   CO2 01/08/2021 23  22 - 32 mmol/L Final   Glucose, Bld 01/08/2021 123 (H)  70 - 99 mg/dL Final   Glucose reference range applies only to samples taken after fasting for at least 8 hours.   BUN 01/08/2021 22 (H)  6 - 20 mg/dL Final   Creatinine, Ser 01/08/2021 1.19  0.61 - 1.24 mg/dL Final   Calcium 01/08/2021 9.4  8.9 - 10.3 mg/dL Final   Total  Protein 01/08/2021 7.2  6.5 - 8.1 g/dL Final   Albumin 01/08/2021 4.0  3.5 - 5.0 g/dL Final   AST 01/08/2021 109 (H)  15 - 41 U/L Final   ALT 01/08/2021 56 (H)  0 - 44 U/L Final   Alkaline Phosphatase 01/08/2021 105  38 - 126 U/L Final   Total Bilirubin 01/08/2021 0.9  0.3 - 1.2 mg/dL Final   GFR, Estimated 01/08/2021 >60  >60 mL/min Final  Comment: (NOTE) Calculated using the CKD-EPI Creatinine Equation (2021)    Anion gap 01/08/2021 11  5 - 15 Final   Performed at [facility], [place] [address], [place], [place] [address]  Appointment on 01/06/2021  Component Date Value Ref Range Status   CD4 T Cell Abs 01/06/2021 910  400 - 1,790 /uL Final   CD4 % Helper T Cell 01/06/2021 36  33 - 65 % Final   Performed at [facility], [place] [address], [place], [place] [address]   HIV 1 RNA Quant 01/06/2021 <20 (H)  Copies/mL Final   HIV-1 RNA Detected   HIV-1 RNA Quant, Log 01/06/2021 <1.30 (H)  Log cps/mL Final   Comment: HIV-1 RNA Detected . HIV-1 RNA was detected below 20 copies/mL. Viral nucleic acid detected below this level cannot be quantified by the assay. . . Reference Range:                           Not Detected     copies/mL                           Not Detected Log copies/mL . Marland Kitchen The test was performed using Real-Time Polymerase Chain Reaction. . . Reportable Range: 20 copies/mL to 10,000,000 copies/mL (1.30 Log copies/mL to 7.00 Log copies/mL). .   Admission on 01/02/2021, Discharged on 01/03/2021  Component Date Value Ref Range Status   Lipase 01/03/2021 44  11 - 51 U/L Final   Performed at [facility], [place] [address] [place], [name] [address]   Sodium 01/03/2021 137  135 - 145 mmol/L Final   Potassium 01/03/2021 4.2  3.5 - 5.1 mmol/L Final   Chloride 01/03/2021 101  98 - 111 mmol/L Final   CO2 01/03/2021 30  22 - 32 mmol/L Final   Glucose, Bld 01/03/2021 123 (H)  70 - 99 mg/dL Final   Glucose reference range applies only to  samples taken after fasting for at least 8 hours.   BUN 01/03/2021 12  6 - 20 mg/dL Final   Creatinine, Ser 01/03/2021 1.12  0.61 - 1.24 mg/dL Final   Calcium 01/03/2021 10.0  8.9 - 10.3 mg/dL Final   Total Protein 01/03/2021 7.6  6.5 - 8.1 g/dL Final   Albumin 01/03/2021 4.1  3.5 - 5.0 g/dL Final   AST 01/03/2021 27  15 - 41 U/L Final   ALT 01/03/2021 41  0 - 44 U/L Final   Alkaline Phosphatase 01/03/2021 110  38 - 126 U/L Final   Total Bilirubin 01/03/2021 0.5  0.3 - 1.2 mg/dL Final   GFR, Estimated 01/03/2021 >60  >60 mL/min Final   Comment: (NOTE) Calculated using the CKD-EPI Creatinine Equation (2021)    Anion gap 01/03/2021 6  5 - 15 Final   Performed at [place] [address], [place], [name] [address]   WBC 01/03/2021 10.4  4.0 - 10.5 K/uL Final   RBC 01/03/2021 5.04  4.22 - 5.81 MIL/uL Final   Hemoglobin 01/03/2021 15.4  13.0 - 17.0 g/dL Final   HCT 01/03/2021 46.0  39.0 - 52.0 % Final   MCV 01/03/2021 91.3  80.0 - 100.0 fL Final   MCH 01/03/2021 30.6  26.0 - 34.0 pg Final   MCHC 01/03/2021 33.5  30.0 - 36.0 g/dL Final   RDW 01/03/2021 12.8  11.5 - 15.5 % Final  Platelets 01/03/2021 231  150 - 400 K/uL Final   nRBC 01/03/2021 0.0  0.0 - 0.2 % Final   Performed at [facility], [place] [address] [place], [name] [address]   Color, Urine 01/03/2021 YELLOW  YELLOW Final   APPearance 01/03/2021 HAZY (A)  CLEAR Final   Specific Gravity, Urine 01/03/2021 1.015  1.005 - 1.030 Final   pH 01/03/2021 8.0  5.0 - 8.0 Final   Glucose, UA 01/03/2021 NEGATIVE  NEGATIVE mg/dL Final   Hgb urine dipstick 01/03/2021 NEGATIVE  NEGATIVE Final   Bilirubin Urine 01/03/2021 NEGATIVE  NEGATIVE Final   Ketones, ur 01/03/2021 NEGATIVE  NEGATIVE mg/dL Final   Protein, ur 01/03/2021 NEGATIVE  NEGATIVE mg/dL Final   Nitrite 01/03/2021 NEGATIVE  NEGATIVE Final   Leukocytes,Ua 01/03/2021 NEGATIVE  NEGATIVE Final   Comment: Microscopic not done on urines with negative protein, blood,  leukocytes, nitrite, or glucose < 500 mg/dL. Performed at [facility], [place] [address] [place], [place] [address]   Admission on 10/25/2020, Discharged on 10/31/2020  Component Date Value Ref Range Status   SARS Coronavirus 2 by RT PCR 10/26/2020 NEGATIVE  NEGATIVE Final   Comment: (NOTE) SARS-CoV-2 target nucleic acids are NOT DETECTED.  The SARS-CoV-2 RNA is generally detectable in upper respiratory specimens during the acute phase of infection. The lowest concentration of SARS-CoV-2 viral copies this assay can detect is 138 copies/mL. A negative result does not preclude SARS-Cov-2 infection and should not be used as the sole basis for treatment or other patient management decisions. A negative result may occur with  improper specimen collection/handling, submission of specimen other than nasopharyngeal swab, presence of viral mutation(s) within the areas targeted by this assay, and inadequate number of viral copies(<138 copies/mL). A negative result must be combined with clinical observations, patient history, and epidemiological information. The expected result is Negative.  Fact Sheet for Patients:  EntrepreneurPulse.com.au  Fact Sheet for Healthcare Providers:  IncredibleEmployment.be  This test is no                          t yet approved or cleared by the [place] FDA and  has been authorized for detection and/or diagnosis of SARS-CoV-2 by FDA under an Emergency Use Authorization (EUA). This EUA will remain  in effect (meaning this test can be used) for the duration of the COVID-19 declaration under Section 564(b)(1) of the Act, 21 U.S.C.section 360bbb-3(b)(1), unless the authorization is terminated  or revoked sooner.       Influenza A by PCR 10/26/2020 NEGATIVE  NEGATIVE Final   Influenza B by PCR 10/26/2020 NEGATIVE  NEGATIVE Final   Comment: (NOTE) The Xpert Xpress SARS-CoV-2/FLU/RSV plus assay is intended as an  aid in the diagnosis of influenza from Nasopharyngeal swab specimens and should not be used as a sole basis for treatment. Nasal washings and aspirates are unacceptable for Xpert Xpress SARS-CoV-2/FLU/RSV testing.  Fact Sheet for Patients: EntrepreneurPulse.com.au  Fact Sheet for Healthcare Providers: IncredibleEmployment.be  This test is not yet approved or cleared by the [place] FDA and has been authorized for detection and/or diagnosis of SARS-CoV-2 by FDA under an Emergency Use Authorization (EUA). This EUA will remain in effect (meaning this test can be used) for the duration of the COVID-19 declaration under Section 564(b)(1) of the Act, 21 U.S.C. section 360bbb-3(b)(1), unless the authorization is terminated or revoked.  Performed at [facility], [place] [address] [place], [name]  [address]    SARS Coronavirus 2 Ag 10/26/2020 Negative  Negative Preliminary   WBC 10/26/2020 7.7  4.0 - 10.5 K/uL Final   RBC 10/26/2020 4.58  4.22 - 5.81 MIL/uL Final   Hemoglobin 10/26/2020 14.1  13.0 - 17.0 g/dL Final   HCT 10/26/2020 41.4  39.0 - 52.0 % Final   MCV 10/26/2020 90.4  80.0 - 100.0 fL Final   MCH 10/26/2020 30.8  26.0 - 34.0 pg Final   MCHC 10/26/2020 34.1  30.0 - 36.0 g/dL Final   RDW 10/26/2020 12.6  11.5 - 15.5 % Final   Platelets 10/26/2020 218  150 - 400 K/uL Final   nRBC 10/26/2020 0.0  0.0 - 0.2 % Final   Neutrophils Relative % 10/26/2020 58  % Final   Neutro Abs 10/26/2020 4.4  1.7 - 7.7 K/uL Final   Lymphocytes Relative 10/26/2020 36  % Final   Lymphs Abs 10/26/2020 2.8  0.7 - 4.0 K/uL Final   Monocytes Relative 10/26/2020 5  % Final   Monocytes Absolute 10/26/2020 0.4  0.1 - 1.0 K/uL Final   Eosinophils Relative 10/26/2020 1  % Final   Eosinophils Absolute 10/26/2020 0.1  0.0 - 0.5 K/uL Final   Basophils Relative 10/26/2020 0  % Final   Basophils Absolute 10/26/2020 0.0  0.0 - 0.1 K/uL Final   Immature Granulocytes  10/26/2020 0  % Final   Abs Immature Granulocytes 10/26/2020 0.00  0.00 - 0.07 K/uL Final   Performed at [facility], [place] [address] [place], [name] [address]   Sodium 10/26/2020 136  135 - 145 mmol/L Final   Potassium 10/26/2020 3.8  3.5 - 5.1 mmol/L Final   Chloride 10/26/2020 105  98 - 111 mmol/L Final   CO2 10/26/2020 21 (L)  22 - 32 mmol/L Final   Glucose, Bld 10/26/2020 151 (H)  70 - 99 mg/dL Final   Glucose reference range applies only to samples taken after fasting for at least 8 hours.   BUN 10/26/2020 15  6 - 20 mg/dL Final   Creatinine, Ser 10/26/2020 1.16  0.61 - 1.24 mg/dL Final   Calcium 10/26/2020 9.7  8.9 - 10.3 mg/dL Final   Total Protein 10/26/2020 6.9  6.5 - 8.1 g/dL Final   Albumin 10/26/2020 4.1  3.5 - 5.0 g/dL Final   AST 10/26/2020 34  15 - 41 U/L Final   ALT 10/26/2020 38  0 - 44 U/L Final   Alkaline Phosphatase 10/26/2020 91  38 - 126 U/L Final   Total Bilirubin 10/26/2020 0.7  0.3 - 1.2 mg/dL Final   GFR, Estimated 10/26/2020 >60  >60 mL/min Final   Comment: (NOTE) Calculated using the CKD-EPI Creatinine Equation (2021)    Anion gap 10/26/2020 10  5 - 15 Final   Performed at [facility] [address], [place], [name] [address]   Hgb A1c MFr Bld 10/26/2020 6.0 (H)  4.8 - 5.6 % Final   Comment: (NOTE) Pre diabetes:          5.7%-6.4%  Diabetes:              >6.4%  Glycemic control for   <7.0% adults with diabetes    Mean Plasma Glucose 10/26/2020 125.5  mg/dL Final   Performed at [facility], [place] [address], [place], [place] [address]   Alcohol, Ethyl (B) 10/26/2020 <10  <10 mg/dL Final   Comment: (NOTE) Lowest detectable limit for serum alcohol is 10 mg/dL.  For medical purposes only. Performed at [facility], [place] [address], [place], [place] [address]    Cholesterol 10/26/2020 153  0 - 200 mg/dL Final   Triglycerides 10/26/2020 80  <150 mg/dL Final   HDL 10/26/2020 43  >40 mg/dL Final   Total CHOL/HDL Ratio  10/26/2020 3.6  RATIO Final   VLDL 10/26/2020 16  0 - 40 mg/dL Final   LDL Cholesterol 10/26/2020 94  0 - 99 mg/dL Final   Comment:        Total Cholesterol/HDL:CHD Risk Coronary Heart Disease Risk Table                     Men   Women  1/2 Average Risk   3.4   3.3  Average Risk       5.0   4.4  2 X Average Risk   9.6   7.1  3 X Average Risk  23.4   11.0        Use the calculated Patient Ratio above and the CHD Risk Table to determine the patient's CHD Risk.        ATP III CLASSIFICATION (LDL):  <100     mg/dL   Optimal  100-129  mg/dL   Near or Above                    Optimal  130-159  mg/dL   Borderline  160-189  mg/dL   High  >190     mg/dL   Very High Performed at [facility] [address] [place], [place] [address]    TSH 10/26/2020 0.512  0.350 - 4.500 uIU/mL Final   Comment: Performed by a 3rd Generation assay with a functional sensitivity of <=0.01 uIU/mL. Performed at [facility], [place] [address], [place], [name] [address]    POC Amphetamine UR 10/26/2020 None Detected  NONE DETECTED (Cut Off Level 1000 ng/mL) Final   POC Secobarbital (BAR) 10/26/2020 None Detected  NONE DETECTED (Cut Off Level 300 ng/mL) Final   POC Buprenorphine (BUP) 10/26/2020 None Detected  NONE DETECTED (Cut Off Level 10 ng/mL) Final   POC Oxazepam (BZO) 10/26/2020 Positive (A)  NONE DETECTED (Cut Off Level 300 ng/mL) Final   POC Cocaine UR 10/26/2020 Positive (A)  NONE DETECTED (Cut Off Level 300 ng/mL) Final   POC Methamphetamine UR 10/26/2020 None Detected  NONE DETECTED (Cut Off Level 1000 ng/mL) Final   POC Morphine 10/26/2020 None Detected  NONE DETECTED (Cut Off Level 300 ng/mL) Final   POC Oxycodone UR 10/26/2020 None Detected  NONE DETECTED (Cut Off Level 100 ng/mL) Final   POC Methadone UR 10/26/2020 None Detected  NONE DETECTED (Cut Off Level 300 ng/mL) Final   POC Marijuana UR 10/26/2020 None Detected  NONE DETECTED (Cut Off Level 50 ng/mL) Final   Group A Strep by  PCR 10/30/2020 NOT DETECTED  NOT DETECTED Final   Performed at [facility], [place] [address], [place], [place] [address]    Blood Alcohol level:  Lab Results  Component Value Date   [facility] <10 03/13/2021   ETH <10 42/35/3614    Metabolic Disorder Labs: Lab Results  Component Value Date   HGBA1C 5.7 (H) 03/13/2021   MPG 116.89 03/13/2021   MPG 125.5 10/26/2020   Lab Results  Component Value Date   PROLACTIN 12.1 03/08/2020   PROLACTIN 2.6 (L) 11/09/2019   Lab Results  Component Value Date  CHOL 196 03/13/2021   TRIG 62 03/13/2021   HDL 56 03/13/2021   CHOLHDL 3.5 03/13/2021   VLDL 12 03/13/2021   LDLCALC 128 (H) 03/13/2021   LDLCALC 191 (H) 01/07/2021    Therapeutic Lab Levels: No results found for: LITHIUM No results found for: VALPROATE No components found for:  CBMZ  Physical Findings   AIMS    Flowsheet Row Admission (Discharged) from 04/28/2020 in [place] Admission (Discharged) from 03/07/2020 in [facility]  AIMS Total Score 0 0      AUDIT    Flowsheet Row Admission (Discharged) from 04/28/2020 in [place] Admission (Discharged) from 03/07/2020 in [facility] Admission (Discharged) from 12/12/2011 in [facility]  Alcohol Use Disorder Identification Test Final Score (AUDIT) _0 GAD-7    Flowsheet Row Office Visit from 01/17/2021 in [facility] Office Visit from 03/17/2020 in [facility] Office Visit from 07/22/2019 in [place] Office Visit from 09/03/2018 in [facility]  Total GAD-7 Score _1 PHQ2-9    [place] Office Visit from 01/20/2021 in [facility] Office Visit from 01/17/2021 in [facility] ED from 10/25/2020 in [facility] Video Visit from 04/05/2020 in [facility] for Infectious Disease Office Visit from 03/17/2020 in [place]  PHQ-2 Total Score 0 4 5 0 1  PHQ-9 Total Score -- 18 15 -- 9      [place] ED from 03/13/2021 in [facility] ED from 01/07/2021 in [facility] ED from 01/02/2021 in [place] CATEGORY High Risk High Risk No Risk        Musculoskeletal  Strength & Muscle Tone: within normal limits Gait & Station: normal Patient leans: N/A  Psychiatric Specialty Exam  Presentation  General Appearance: Appropriate for Environment; Casual  Eye Contact:Fair  Speech:Clear and Coherent; Normal Rate  Speech Volume:Normal  Handedness:Right   Mood and Affect  Mood:Depressed; Hopeless  Affect:Appropriate   Thought Process  Thought Processes:Coherent  Descriptions of Associations:Intact  Orientation:Full (Time, Place and Person)  Thought Content:Logical  Diagnosis of Schizophrenia or Schizoaffective disorder in past: Yes  Duration of Psychotic Symptoms: Greater than six months   Hallucinations:Hallucinations: Auditory Description of Auditory Hallucinations: hears a voice telling him to kill himself  Ideas of Reference:None  Suicidal Thoughts:Suicidal Thoughts: Yes, Active SI Active Intent and/or Plan: With Intent; With Plan; With Means to [place]  Homicidal Thoughts:Homicidal Thoughts: No   Sensorium  Memory:Immediate Good; Recent Good; Remote Good  Judgment:Poor  Insight:Lacking   Executive Functions  Concentration:Good  Attention Span:Good  [place] of Knowledge:Good  Language:Good   Psychomotor Activity  Psychomotor Activity:Psychomotor Activity: Normal   Assets  Assets:Communication Skills; Desire for Improvement; Financial Resources/Insurance; Housing; Physical  Health; Resilience   Sleep  Sleep:Sleep: Poor   No data recorded  Physical Exam  Physical Exam Vitals and nursing note reviewed.  Constitutional:      Appearance: Normal appearance. He is well-developed.  HENT:     Head: Normocephalic and atraumatic.  Eyes:     General:        Right eye: No discharge.  Left eye: No discharge.     Conjunctiva/sclera: Conjunctivae normal.  Cardiovascular:     Rate and Rhythm: Normal rate.  Pulmonary:     Effort: Pulmonary effort is normal. No respiratory distress.  Musculoskeletal:        General: Normal range of motion.     Cervical back: Neck supple.  Skin:    Coloration: Skin is not jaundiced or pale.  Neurological:     Mental Status: He is alert and oriented to person, place, and time.  Psychiatric:        Attention and Perception: Attention normal. He perceives auditory hallucinations.        Mood and Affect: Mood normal.        Speech: Speech normal.        Behavior: Behavior normal. Behavior is cooperative.        Thought Content: Thought content includes suicidal ideation. Thought content includes suicidal plan.        Cognition and Memory: Cognition normal.        Judgment: Judgment is impulsive.   Review of Systems  Constitutional: Negative.   HENT: Negative.    Eyes: Negative.   Respiratory: Negative.    Cardiovascular: Negative.   Musculoskeletal: Negative.   Skin: Negative.   Neurological: Negative.   Psychiatric/Behavioral:  Positive for depression, hallucinations, substance abuse and suicidal ideas.   Blood pressure 104/64, pulse 78, temperature 97.7 F (36.5 C), temperature source Oral, resp. rate 18, SpO2 100 %. There is no height or weight on file to calculate BMI.  Treatment Plan Summary:  Patient meets criteria for inpatient psychiatric admission.  [facility] notified and there are no available beds.  Social work notified and patient has been faxed out.  Daily contact with patient to assess and evaluate  symptoms and progress in treatment and Medication management  Revonda Humphrey, NP 03/14/2021 7:24 PM

## 2021-03-14 NOTE — ED Notes (Signed)
Patient had shower, complaining because he could not get full scrubs, the patient wore his own clothes up until now with no complaint. Explained to him there were no bottom scubs right now, they have not come back after being sent out. I did give him a top, but he was still not happy said he will not give [name] a good review? And he said I was being sarcastic because I told him the truth? He simply did not like the answer I gave. ? ?

## 2021-03-14 NOTE — ED Notes (Signed)
Patient has been sleeping most of the day today and yesterday. He just now got up to go to the bathroom. ?

## 2021-03-14 NOTE — ED Notes (Signed)
Pt A&O x 4, no distress noted, resting at present.  Monitoring for safety. ?

## 2021-03-14 NOTE — Progress Notes (Signed)
Hourly environmental rounding completed. Pt resting in his recliner with even and unlabored breathing.  Colored pencils removed for the night and placed behind nurse's station. ?

## 2021-03-14 NOTE — ED Notes (Signed)
Pt sleeping at present, no distress noted. Respirations even & unlabored.  Monitoring for safety. 

## 2021-03-14 NOTE — Progress Notes (Signed)
Hourly environmental rounding completed.  Trash removed from bedside. Pt asked for and received a [employer] Kris[employer] treat. ?

## 2021-03-15 ENCOUNTER — Other Ambulatory Visit: Payer: Self-pay

## 2021-03-15 ENCOUNTER — Encounter (HOSPITAL_COMMUNITY): Payer: Self-pay | Admitting: Psychiatry

## 2021-03-15 ENCOUNTER — Inpatient Hospital Stay (HOSPITAL_COMMUNITY)
Admission: AD | Admit: 2021-03-15 | Discharge: 2021-03-21 | DRG: 885 | Disposition: A | Discharge status: Home / Self Care | Payer: Medicaid Other | Source: Intra-hospital | Attending: Emergency Medicine | Admitting: Emergency Medicine

## 2021-03-15 DIAGNOSIS — Z880 Allergy status to penicillin: Secondary | ICD-10-CM | POA: Diagnosis not present

## 2021-03-15 DIAGNOSIS — G8929 Other chronic pain: Secondary | ICD-10-CM | POA: Diagnosis present

## 2021-03-15 DIAGNOSIS — K219 Gastro-esophageal reflux disease without esophagitis: Secondary | ICD-10-CM | POA: Diagnosis present

## 2021-03-15 DIAGNOSIS — Z720 Tobacco use: Secondary | ICD-10-CM | POA: Diagnosis present

## 2021-03-15 DIAGNOSIS — F250 Schizoaffective disorder, bipolar type: Secondary | ICD-10-CM | POA: Diagnosis present

## 2021-03-15 DIAGNOSIS — Z91048 Other nonmedicinal substance allergy status: Secondary | ICD-10-CM

## 2021-03-15 DIAGNOSIS — B20 Human immunodeficiency virus [HIV] disease: Secondary | ICD-10-CM | POA: Diagnosis present

## 2021-03-15 DIAGNOSIS — Z833 Family history of diabetes mellitus: Secondary | ICD-10-CM | POA: Diagnosis not present

## 2021-03-15 DIAGNOSIS — F1010 Alcohol abuse, uncomplicated: Secondary | ICD-10-CM | POA: Diagnosis present

## 2021-03-15 DIAGNOSIS — Z800 Family history of malignant neoplasm of digestive organs: Secondary | ICD-10-CM | POA: Diagnosis not present

## 2021-03-15 DIAGNOSIS — F251 Schizoaffective disorder, depressive type: Secondary | ICD-10-CM | POA: Diagnosis present

## 2021-03-15 DIAGNOSIS — Z8719 Personal history of other diseases of the digestive system: Secondary | ICD-10-CM

## 2021-03-15 DIAGNOSIS — F1590 Other stimulant use, unspecified, uncomplicated: Secondary | ICD-10-CM | POA: Diagnosis present

## 2021-03-15 DIAGNOSIS — F411 Generalized anxiety disorder: Secondary | ICD-10-CM | POA: Diagnosis present

## 2021-03-15 DIAGNOSIS — F17200 Nicotine dependence, unspecified, uncomplicated: Secondary | ICD-10-CM | POA: Diagnosis present

## 2021-03-15 DIAGNOSIS — R45850 Homicidal ideations: Secondary | ICD-10-CM | POA: Diagnosis present

## 2021-03-15 DIAGNOSIS — F17290 Nicotine dependence, other tobacco product, uncomplicated: Secondary | ICD-10-CM | POA: Diagnosis present

## 2021-03-15 DIAGNOSIS — Z96652 Presence of left artificial knee joint: Secondary | ICD-10-CM | POA: Diagnosis present

## 2021-03-15 DIAGNOSIS — R45851 Suicidal ideations: Secondary | ICD-10-CM | POA: Diagnosis present

## 2021-03-15 DIAGNOSIS — F319 Bipolar disorder, unspecified: Secondary | ICD-10-CM | POA: Diagnosis present

## 2021-03-15 DIAGNOSIS — Z8249 Family history of ischemic heart disease and other diseases of the circulatory system: Secondary | ICD-10-CM | POA: Diagnosis not present

## 2021-03-15 DIAGNOSIS — E559 Vitamin D deficiency, unspecified: Secondary | ICD-10-CM | POA: Diagnosis present

## 2021-03-15 DIAGNOSIS — K8020 Calculus of gallbladder without cholecystitis without obstruction: Principal | ICD-10-CM | POA: Diagnosis present

## 2021-03-15 DIAGNOSIS — Z818 Family history of other mental and behavioral disorders: Secondary | ICD-10-CM

## 2021-03-15 DIAGNOSIS — M1990 Unspecified osteoarthritis, unspecified site: Secondary | ICD-10-CM | POA: Diagnosis present

## 2021-03-15 DIAGNOSIS — Z634 Disappearance and death of family member: Secondary | ICD-10-CM

## 2021-03-15 DIAGNOSIS — E785 Hyperlipidemia, unspecified: Secondary | ICD-10-CM | POA: Diagnosis present

## 2021-03-15 DIAGNOSIS — F410 Panic disorder [episodic paroxysmal anxiety] without agoraphobia: Secondary | ICD-10-CM | POA: Diagnosis present

## 2021-03-15 DIAGNOSIS — I10 Essential (primary) hypertension: Secondary | ICD-10-CM | POA: Diagnosis present

## 2021-03-15 DIAGNOSIS — B1910 Unspecified viral hepatitis B without hepatic coma: Secondary | ICD-10-CM | POA: Diagnosis present

## 2021-03-15 DIAGNOSIS — M797 Fibromyalgia: Secondary | ICD-10-CM | POA: Diagnosis present

## 2021-03-15 DIAGNOSIS — R7303 Prediabetes: Secondary | ICD-10-CM | POA: Diagnosis present

## 2021-03-15 DIAGNOSIS — G629 Polyneuropathy, unspecified: Secondary | ICD-10-CM | POA: Diagnosis present

## 2021-03-15 DIAGNOSIS — Z823 Family history of stroke: Secondary | ICD-10-CM

## 2021-03-15 DIAGNOSIS — Z91040 Latex allergy status: Secondary | ICD-10-CM

## 2021-03-15 DIAGNOSIS — Z20822 Contact with and (suspected) exposure to covid-19: Secondary | ICD-10-CM | POA: Diagnosis present

## 2021-03-15 DIAGNOSIS — F1410 Cocaine abuse, uncomplicated: Secondary | ICD-10-CM | POA: Diagnosis present

## 2021-03-15 DIAGNOSIS — F3130 Bipolar disorder, current episode depressed, mild or moderate severity, unspecified: Secondary | ICD-10-CM | POA: Insufficient documentation

## 2021-03-15 DIAGNOSIS — R41843 Psychomotor deficit: Secondary | ICD-10-CM | POA: Diagnosis present

## 2021-03-15 LAB — POC SARS CORONAVIRUS 2 AG: SARSCOV2ONAVIRUS 2 AG: NEGATIVE

## 2021-03-15 LAB — POC SARS CORONAVIRUS 2 AG -  ED: SARS Coronavirus 2 Ag: NEGATIVE

## 2021-03-15 MED ORDER — MAGNESIUM HYDROXIDE 400 MG/5ML PO SUSP: 30.0000 mL | Freq: Every day | ORAL | Status: DC | PRN

## 2021-03-15 MED ORDER — TRAZODONE HCL 50 MG PO TABS
50.0000 mg | ORAL_TABLET | Freq: Every evening | ORAL | Status: DC | PRN
Start: 2021-03-15 — End: 2021-03-21

## 2021-03-15 MED ORDER — DULOXETINE HCL 30 MG PO CPEP: 30.0000 mg | ORAL_CAPSULE | Freq: Every day | ORAL | 3 refills | Status: DC

## 2021-03-15 MED ORDER — BICTEGRAVIR-EMTRICITAB-TENOFOV 50-200-25 MG PO TABS
1.0000 | ORAL_TABLET | Freq: Every day | ORAL | Status: DC
  Administered 2021-03-16 – 2021-03-21 (×6): 1 via ORAL
  Filled 2021-03-15 (×8): qty 1

## 2021-03-15 MED ORDER — GABAPENTIN 300 MG PO CAPS: 300.0000 mg | ORAL_CAPSULE | Freq: Two times a day (BID) | ORAL | Status: DC

## 2021-03-15 MED ORDER — QUETIAPINE FUMARATE 100 MG PO TABS
100.0000 mg | ORAL_TABLET | Freq: Every day | ORAL | Status: DC
  Administered 2021-03-15 – 2021-03-16 (×2): 100 mg via ORAL
  Filled 2021-03-15 (×4): qty 1

## 2021-03-15 MED ORDER — OLANZAPINE 10 MG PO TBDP: 10.0000 mg | ORAL_TABLET | Freq: Three times a day (TID) | ORAL | Status: DC | PRN

## 2021-03-15 MED ORDER — ALUM & MAG HYDROXIDE-SIMETH 200-200-20 MG/5ML PO SUSP
30.0000 mL | ORAL | Status: DC | PRN
  Administered 2021-03-16 – 2021-03-20 (×2): 30 mL via ORAL
  Filled 2021-03-15 (×2): qty 30

## 2021-03-15 MED ORDER — QUETIAPINE FUMARATE 100 MG PO TABS: 100.0000 mg | ORAL_TABLET | Freq: Every day | ORAL | Status: DC

## 2021-03-15 MED ORDER — ACETAMINOPHEN 325 MG PO TABS
650.0000 mg | ORAL_TABLET | Freq: Four times a day (QID) | ORAL | Status: DC | PRN
  Administered 2021-03-15 – 2021-03-16 (×2): 650 mg via ORAL
  Filled 2021-03-15 (×2): qty 2

## 2021-03-15 MED ORDER — ZIPRASIDONE MESYLATE 20 MG IM SOLR: 20.0000 mg | INTRAMUSCULAR | Status: DC | PRN

## 2021-03-15 MED ORDER — DULOXETINE HCL 30 MG PO CPEP
30.0000 mg | ORAL_CAPSULE | Freq: Every day | ORAL | Status: DC
  Administered 2021-03-16: 30 mg via ORAL
  Filled 2021-03-15 (×3): qty 1

## 2021-03-15 MED ORDER — LORAZEPAM 1 MG PO TABS: 1.0000 mg | ORAL_TABLET | ORAL | Status: DC | PRN

## 2021-03-15 MED ORDER — TRAZODONE HCL 50 MG PO TABS
50.0000 mg | ORAL_TABLET | Freq: Every evening | ORAL | Status: DC | PRN
  Administered 2021-03-15 – 2021-03-20 (×3): 50 mg via ORAL
  Filled 2021-03-15 (×3): qty 1

## 2021-03-15 MED ORDER — BIKTARVY 50-200-25 MG PO TABS: 1.0000 | ORAL_TABLET | Freq: Every day | ORAL | Status: DC

## 2021-03-15 MED ORDER — HYDROXYZINE HCL 25 MG PO TABS
25.0000 mg | ORAL_TABLET | Freq: Three times a day (TID) | ORAL | Status: DC | PRN
  Administered 2021-03-15 – 2021-03-17 (×3): 25 mg via ORAL
  Filled 2021-03-15 (×3): qty 1

## 2021-03-15 MED ORDER — GABAPENTIN 300 MG PO CAPS
300.0000 mg | ORAL_CAPSULE | Freq: Two times a day (BID) | ORAL | Status: DC
  Administered 2021-03-15 – 2021-03-17 (×4): 300 mg via ORAL
  Filled 2021-03-15 (×6): qty 1

## 2021-03-15 NOTE — ED Notes (Signed)
Pt was given a hot meal for lunch. ?

## 2021-03-15 NOTE — ED Notes (Signed)
Pt continues to sleep quietly. Will continue to monitor. ?

## 2021-03-15 NOTE — ED Notes (Signed)
Pt sleeping at present.  No distress noted.  Monitoring for safety. 

## 2021-03-15 NOTE — Progress Notes (Signed)
Patient resting in reclined chair, respirations even and  unlabored. Nursing staff will continue to monitor. ?

## 2021-03-15 NOTE — Discharge Instructions (Signed)
Transfer to [facility] for inpatient psychiatric bed availability  ?

## 2021-03-15 NOTE — ED Notes (Signed)
Pt had lunch but afterwards complained about it. He ate the entire roast beef and gravy but complained that he couldn't eat gravy. ?

## 2021-03-15 NOTE — ED Notes (Signed)
Pt is currently using the phone. ?

## 2021-03-15 NOTE — ED Notes (Signed)
Pt was given cereal, milk, muffin and juice for breakfast. ?

## 2021-03-15 NOTE — ED Provider Notes (Signed)
FBC/OBS ASAP Discharge Summary  Date and Time: 03/15/2021 2:43 PM  Name: Lincoln Ginley  MRN:  [id number]   Discharge Diagnoses:  Final diagnoses:  None    Subjective:  [name], 56 y.o., male patient who initially presents to [facility] on 03/13/2021 with suicidal ideations with a plan to shoot himself with a double barrel shotgun.  Recommendation was made by Dr. Leverne Humbles for inpatient psychiatric treatment. He has remained in the continuous assessment unit while awaiting inpatient psychiatric bed availability. Patient seen face to face by this provider, Dr. Serafina Mitchell; and  chart reviewed on 03/15/21.     He relapsed on alcohol and cocaine last Friday.  He has been drinking 4 shots of liquor daily and uses cocaine once.  He endorses daily nicotine use.  Upon admission patient's BAL less than 10. UDS positive for cocaine  During evaluation Henrique Parekh is is alert/oriented x 4. His speech is at a moderate pace and tone. He is making fair eye contact. He is sitting in his bed during the assessment. States he feels agitated and more anxious today. He continues to endorse depression and suicidal thoughts. States, "I don't want to be in this world any more". He denies any specific suicide plan at the time. He can not contract for safety. He is complaining of leg pain. Educated that Gabapentin had been started on admission. Will discontinue Crestor for now due to possible cause of leg pain. Patients Lipid panel on admission was WNL except LDL was 128. Patient is able to ambulate with out any assistance and denies any recent falls. He denies AVH states the last time he heard voices that "tell me kill myself" was last night. He denies VH and HI. He continues to deny any withdrawal symptoms and is tolerating medications with out any adverse reactions.     Stay Summary:   Jamare Vanatta remained cooperative while on the unit. He exhibited no unsafe behaviors and has been complaint with medications. He continues  to meet the criteria for inpatient psychiatric admission. [facility] notified and patient was accepted.   Total Time spent with patient: 30 minutes  Past Psychiatric History: see H&P Past Medical History:  Past Medical History:  Diagnosis Date   Anxiety    Arthritis    Bipolar 1 disorder ([place])    Colon polyps    Depression    GERD (gastroesophageal reflux disease)    Hepatitis B    Human immunodeficiency virus (HIV) ([place])    Hyperlipidemia    Hypertension    Neuromuscular disorder ([place])    neuropathy   Neuropathy    Pre-diabetes    Schizophrenia ([place])     Past Surgical History:  Procedure Laterality Date   FOOT ARTHRODESIS Right 09/17/2019   Procedure: FUSION RIGHT LISFRANC JOINT;  Surgeon: Newt Minion, MD;  Location: [facility];  Service: Orthopedics;  Laterality: Right;   FOOT ARTHRODESIS Right 09/2019   HEMORROIDECTOMY     IR FLUORO GUIDE CV LINE RIGHT  02/22/2017   IR US GUIDE VASC ACCESS RIGHT  02/22/2017   TUMOR REMOVAL     From Chest   Family History:  Family History  Problem Relation Age of Onset   CAD Mother    Diabetes Father    Colon cancer Maternal Aunt 60   Diabetes Maternal Aunt    Heart disease Maternal Uncle    Esophageal cancer Neg Hx    Rectal cancer Neg Hx    Stomach cancer Neg Hx  Family Psychiatric History: See H&P Social History:  Social History   Substance and Sexual Activity  Alcohol Use Yes   Comment: occassional use on weekends     Social History   Substance and Sexual Activity  Drug Use Yes   Types: Cocaine, Marijuana   Comment: last use was Friday 03/05/2020    Social History   Socioeconomic History   Marital status: Married    Spouse name: Not on file   Number of children: 1   Years of education: Not on file   Highest education level: Not on file  Occupational History   Not on file  Tobacco Use   Smoking status: Every Day    Packs/day: 0.50    Years: 15.00    Pack years: 7.50    Types: E-cigarettes, Cigarettes    Smokeless tobacco: Never   Tobacco comments:    has patches when he is ready to quit  Vaping Use   Vaping Use: Some days   Substances: Nicotine  Substance and Sexual Activity   Alcohol use: Yes    Comment: occassional use on weekends   Drug use: Yes    Types: Cocaine, Marijuana    Comment: last use was Friday 03/05/2020   Sexual activity: Yes    Partners: Female    Birth control/protection: Condom    Comment: condoms given  Other Topics Concern   Not on file  Social History Narrative   Not on file   Social Determinants of Health   Financial Resource Strain: Not on file  Food Insecurity: Not on file  Transportation Needs: Not on file  Physical Activity: Not on file  Stress: Not on file  Social Connections: Not on file   SDOH:  SDOH Screenings   Alcohol Screen: Low Risk    Last Alcohol Screening Score (AUDIT): 3  Depression (PHQ2-9): Low Risk    PHQ-2 Score: 0  Financial Resource Strain: Not on file  Food Insecurity: Not on file  Housing: Not on file  Physical Activity: Not on file  Social Connections: Not on file  Stress: Not on file  Tobacco Use: High Risk   Smoking Tobacco Use: Every Day   Smokeless Tobacco Use: Never   Passive Exposure: Not on file  Transportation Needs: Not on file    Tobacco Cessation:  Prescription not provided because: patient being transferred to [facility]   Current Medications:  Current Facility-Administered Medications  Medication Dose Route Frequency Provider Last Rate Last Admin   acetaminophen (TYLENOL) tablet 650 mg  650 mg Oral Q6H PRN White, Patrice L, NP   650 mg at 03/14/21 1016   alum & mag hydroxide-simeth (MAALOX/MYLANTA) 200-200-20 MG/5ML suspension 30 mL  30 mL Oral Q4H PRN White, Patrice L, NP   30 mL at 03/15/21 1249   bictegravir-emtricitabine-tenofovir AF (BIKTARVY) 50-200-25 MG per tablet 1 tablet  1 tablet Oral Daily White, Patrice L, NP   1 tablet at 03/15/21 0851   DULoxetine (CYMBALTA) DR capsule 30 mg  30 mg Oral  Daily White, Patrice L, NP   30 mg at 03/15/21 0851   gabapentin (NEURONTIN) capsule 300 mg  300 mg Oral BID White, Patrice L, NP   300 mg at 03/15/21 0851   hydrOXYzine (ATARAX) tablet 25 mg  25 mg Oral TID PRN White, Patrice L, NP   25 mg at 03/14/21 2104   magnesium hydroxide (MILK OF MAGNESIA) suspension 30 mL  30 mL Oral Daily PRN Marissa Calamity, NP  nicotine (NICODERM CQ - dosed in mg/24 hours) patch 14 mg  14 mg Transdermal Daily White, Patrice L, NP   14 mg at 03/15/21 0854   QUEtiapine (SEROQUEL) tablet 100 mg  100 mg Oral QHS White, Patrice L, NP   100 mg at 03/14/21 2104   rosuvastatin (CRESTOR) tablet 20 mg  20 mg Oral Daily White, Patrice L, NP   20 mg at 03/15/21 0851   traZODone (DESYREL) tablet 50 mg  50 mg Oral QHS PRN White, Patrice L, NP   50 mg at 03/14/21 2104   Current Outpatient Medications  Medication Sig Dispense Refill   hydrOXYzine (ATARAX) 25 MG tablet Take 1 tablet (25 mg total) by mouth 3 (three) times daily as needed for anxiety. 60 tablet 1   rosuvastatin (CRESTOR) 20 MG tablet Take 1 tablet (20 mg total) by mouth daily. 30 tablet 1   [START ON 03/16/2021] bictegravir-emtricitabine-tenofovir AF (BIKTARVY) 50-200-25 MG TABS tablet Take 1 tablet by mouth daily. 30 tablet    [START ON 03/16/2021] DULoxetine (CYMBALTA) 30 MG capsule Take 1 capsule (30 mg total) by mouth daily.  3   gabapentin (NEURONTIN) 300 MG capsule Take 1 capsule (300 mg total) by mouth 2 (two) times daily.     QUEtiapine (SEROQUEL) 100 MG tablet Take 1 tablet (100 mg total) by mouth at bedtime.     traZODone (DESYREL) 50 MG tablet Take 1 tablet (50 mg total) by mouth at bedtime as needed for sleep.      PTA Medications: (Not in a hospital admission)   Musculoskeletal  Strength & Muscle Tone: within normal limits Gait & Station: normal Patient leans: N/A  Psychiatric Specialty Exam  Presentation  General Appearance: Appropriate for Environment; Casual  Eye  Contact:Fair  Speech:Clear and Coherent; Normal Rate  Speech Volume:Normal  Handedness:Right   Mood and Affect  Mood:Depressed; Anxious; Hopeless  Affect:Congruent   Thought Process  Thought Processes:Coherent  Descriptions of Associations:Intact  Orientation:Full (Time, Place and Person)  Thought Content:Logical  Diagnosis of Schizophrenia or Schizoaffective disorder in past: Yes  Duration of Psychotic Symptoms: Greater than six months   Hallucinations:Hallucinations: Auditory Description of Auditory Hallucinations: hears voices telling him to kill himslef, last hear voices last night  Ideas of Reference:None  Suicidal Thoughts:Suicidal Thoughts: Yes, Active SI Active Intent and/or Plan: With Intent; With Plan; With Means to Carry Out  Homicidal Thoughts:Homicidal Thoughts: No   Sensorium  Memory:Immediate Good; Recent Good; Remote Good  Judgment:Poor  Insight:Poor   Executive Functions  Concentration:Good  Attention Span:Good  [place] of Knowledge:Good  Language:Good   Psychomotor Activity  Psychomotor Activity:Psychomotor Activity: Normal   Assets  Assets:Communication Skills; Desire for Improvement; Financial Resources/Insurance; Physical Health; Resilience   Sleep  Sleep:Sleep: Fair   No data recorded  Physical Exam  Physical Exam Vitals and nursing note reviewed.  Constitutional:      General: He is not in acute distress.    Appearance: He is well-developed.  HENT:     Head: Normocephalic and atraumatic.  Eyes:     General:        Right eye: No discharge.        Left eye: No discharge.     Conjunctiva/sclera: Conjunctivae normal.  Cardiovascular:     Rate and Rhythm: Normal rate.  Pulmonary:     Effort: Pulmonary effort is normal. No respiratory distress.  Musculoskeletal:        General: Normal range of motion.     Cervical back:  Normal range of motion.  Skin:    Coloration: Skin is not jaundiced or pale.   Neurological:     Mental Status: He is alert and oriented to person, place, and time.  Psychiatric:        Attention and Perception: Attention normal. He perceives auditory hallucinations.        Mood and Affect: Mood is anxious and depressed.        Speech: Speech normal.        Behavior: Behavior normal. Behavior is cooperative.        Thought Content: Thought content includes suicidal ideation. Thought content includes suicidal plan.        Cognition and Memory: Cognition normal.        Judgment: Judgment is impulsive.   Review of Systems  Constitutional: Negative.   HENT: Negative.    Eyes: Negative.   Respiratory: Negative.    Cardiovascular: Negative.   Genitourinary: Negative.   Musculoskeletal: Negative.   Skin: Negative.   Neurological: Negative.   Psychiatric/Behavioral:  Positive for depression, hallucinations and suicidal ideas. The patient is nervous/anxious.   Blood pressure 107/67, pulse 69, temperature 98 F (36.7 C), temperature source Oral, resp. rate 18, SpO2 99 %. There is no height or weight on file to calculate BMI.  Demographic Factors:  Male, Low socioeconomic status, Living alone, and Unemployed  Loss Factors: Financial problems/change in socioeconomic status  Historical Factors: Family history of mental illness or substance abuse and Impulsivity  Risk Reduction Factors:   Positive coping skills or problem solving skills  Continued Clinical Symptoms:  Severe Anxiety and/or Agitation Depression:   Comorbid alcohol abuse/dependence Hopelessness Impulsivity Alcohol/Substance Abuse/Dependencies Schizophrenia:   Depressive state More than one psychiatric diagnosis Previous Psychiatric Diagnoses and Treatments  Cognitive Features That Contribute To Risk:  None    Suicide Risk:  Severe:  Frequent, intense, and enduring suicidal ideation, specific plan, no subjective intent, but some objective markers of intent (i.e., choice of lethal method), the  method is accessible, some limited preparatory behavior, evidence of impaired self-control, severe dysphoria/symptomatology, multiple risk factors present, and few if any protective factors, particularly a lack of social support.  Plan Of Care/Follow-up recommendations:  Activity:  as tolerated  Diet:  regular   Disposition: Patient continues to meet criteria for inpatient psychiatric admission. Discharge Patient and readmit to [facility].  Admission orders placed.  Revonda Humphrey, NP 03/15/2021, 2:43 PM

## 2021-03-15 NOTE — Progress Notes (Signed)
Nursing report given to [facility], [place] at [facility]. ?
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
## 2021-03-15 NOTE — Progress Notes (Signed)
[facility] LCSW Progress Note ?  ?03/15/2021    1:45 PM ? ?Dylan [name]  ? ?[id number]  ? ?Type of Contact and Topic:  Psychiatric Bed Placement  ? ?Pt accepted to [facility]   ? ?Patient meets inpatient criteria per Dr. Serafina Mitchell   ? ?The attending provider will be Janine Limbo, MD  ? ?Call report to [phone]   ? ?Jerry Caras, RN @ [facility] notified.    ? ?Pt scheduled  to arrive at [facility] at 1500.  ? ? ?Mariea Clonts, MSW, LCSW-A  ?1:46 PM 03/15/2021   ?  ? ?  ?  ? ? ? ? ?  ?

## 2021-03-15 NOTE — Progress Notes (Signed)
Patient complaining of the food in the urgent care. Stating he can't eat the hot meals because the gravy, although the patient did eat the meal. Patient asked to order some food. Patient very demanding, does not appear  to be depressed, or responding to internal stimuli. ?

## 2021-03-16 ENCOUNTER — Encounter (HOSPITAL_COMMUNITY): Payer: Self-pay

## 2021-03-16 DIAGNOSIS — F319 Bipolar disorder, unspecified: Secondary | ICD-10-CM

## 2021-03-16 DIAGNOSIS — F1410 Cocaine abuse, uncomplicated: Secondary | ICD-10-CM | POA: Diagnosis present

## 2021-03-16 DIAGNOSIS — F1590 Other stimulant use, unspecified, uncomplicated: Secondary | ICD-10-CM | POA: Diagnosis present

## 2021-03-16 MED ORDER — IBUPROFEN 400 MG PO TABS
400.0000 mg | ORAL_TABLET | Freq: Four times a day (QID) | ORAL | Status: DC | PRN
  Administered 2021-03-17 – 2021-03-19 (×4): 400 mg via ORAL
  Filled 2021-03-16 (×4): qty 1

## 2021-03-16 NOTE — Progress Notes (Signed)
DAR NOTE: Patient presents with irritable affect and depressed mood.  Denies suicidal thoughts, auditory and visual hallucinations.  Rates depression at 0, hopelessness at 0, and anxiety at 0.  Maintained on routine safety checks.  Medications given as prescribed.  Support and encouragement offered as needed.  States goal for today is "medication management."  Patient visible in milieu with minimal interaction.  Patient is safe on and off the unit.  Offered no complaint.   ? ?

## 2021-03-16 NOTE — Group Note (Signed)
Recreation Therapy Group Note ? ? ?Group Topic:Stress Management  ?Group Date: 03/16/2021 ?Start Time: [phone] ?End Time: 0923 ?Facilitators: Victorino Sparrow, LRT,CTRS ?Location: [facility] ? ? ?Goal Area(s) Addresses:  ?Patient will identify positive stress management techniques. ?Patient will identify benefits of using stress management post d/c. ? ?Group Description:  Meditation.  LRT played a meditation that focused on letting go of the past and not letting it inhibit progress moving forward.  It also focused on not dwelling on things that you can't change because they have already happened.  Patients were to listen to the meditation and follow along as it played to fully engage.  ? ? ?Affect/Mood: Appropriate ?  ?Participation Level: Active ?  ?Participation Quality: Independent ?  ?Behavior: Attentive  ?  ?Speech/Thought Process: Focused ?  ?Insight: Good ?  ?Judgement: Good ?  ?Modes of Intervention: Meditation ?  ?Patient Response to Interventions:  Engaged ?  ?Education Outcome: ? Acknowledges education and In group clarification offered   ? ?Clinical Observations/Individualized Feedback: Pt attended and participated in group session.  ? ? ?Plan: Continue to engage patient in RT group sessions 2-3x/week. ? ? ?Victorino Sparrow, LRT,CTRS ?03/16/2021 1:15 PM ?
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
## 2021-03-16 NOTE — BH IP Treatment Plan (Signed)
Interdisciplinary Treatment and Diagnostic Plan Update ? ?03/16/2021 ?Time of Session: 9:00am  ?Dylan [name] ?MRN: [id number] ? ?Principal Diagnosis: Schizoaffective disorder, bipolar type ([place]) ? ?Secondary Diagnoses: Principal Problem: ?  Schizoaffective disorder, bipolar type ([place]) ?Active Problems: ?  Tobacco abuse ?  Generalized anxiety disorder ?  Alcohol abuse ?  Vitamin D deficiency ?  Cocaine abuse ([place]) ? ? ?Current Medications:  ?Current Facility-Administered Medications  ?Medication Dose Route Frequency Provider Last Rate Last Admin  ? acetaminophen (TYLENOL) tablet 650 mg  650 mg Oral Q6H PRN Revonda Humphrey, NP   650 mg at 03/16/21 1333  ? alum & mag hydroxide-simeth (MAALOX/MYLANTA) 200-200-20 MG/5ML suspension 30 mL  30 mL Oral Q4H PRN Revonda Humphrey, NP      ? bictegravir-emtricitabine-tenofovir AF (BIKTARVY) 50-200-25 MG per tablet 1 tablet  1 tablet Oral Daily Revonda Humphrey, NP   1 tablet at 03/16/21 0827  ? gabapentin (NEURONTIN) capsule 300 mg  300 mg Oral BID Revonda Humphrey, NP   300 mg at 03/16/21 0600  ? hydrOXYzine (ATARAX) tablet 25 mg  25 mg Oral TID PRN Revonda Humphrey, NP   25 mg at 03/15/21 2128  ? OLANZapine zydis (ZYPREXA) disintegrating tablet 10 mg  10 mg Oral Q8H PRN Revonda Humphrey, NP      ? And  ? LORazepam (ATIVAN) tablet 1 mg  1 mg Oral PRN Revonda Humphrey, NP      ? And  ? ziprasidone (GEODON) injection 20 mg  20 mg Intramuscular PRN Revonda Humphrey, NP      ? magnesium hydroxide (MILK OF MAGNESIA) suspension 30 mL  30 mL Oral Daily PRN Revonda Humphrey, NP      ? QUEtiapine (SEROQUEL) tablet 100 mg  100 mg Oral QHS Revonda Humphrey, NP   100 mg at 03/15/21 2128  ? traZODone (DESYREL) tablet 50 mg  50 mg Oral QHS PRN Revonda Humphrey, NP   50 mg at 03/15/21 2130  ? ?PTA Medications: ?Medications Prior to Admission  ?Medication Sig Dispense Refill Last Dose  ? bictegravir-emtricitabine-tenofovir AF (BIKTARVY) 50-200-25 MG TABS tablet Take 1  tablet by mouth daily. 30 tablet    ? DULoxetine (CYMBALTA) 30 MG capsule Take 1 capsule (30 mg total) by mouth daily.  3   ? gabapentin (NEURONTIN) 300 MG capsule Take 1 capsule (300 mg total) by mouth 2 (two) times daily.     ? hydrOXYzine (ATARAX) 25 MG tablet Take 1 tablet (25 mg total) by mouth 3 (three) times daily as needed for anxiety. 60 tablet 1   ? QUEtiapine (SEROQUEL) 100 MG tablet Take 1 tablet (100 mg total) by mouth at bedtime.     ? rosuvastatin (CRESTOR) 20 MG tablet Take 1 tablet (20 mg total) by mouth daily. 30 tablet 1   ? traZODone (DESYREL) 50 MG tablet Take 1 tablet (50 mg total) by mouth at bedtime as needed for sleep.     ? ? ?Patient Stressors:   ? ?Patient Strengths:   ? ?Treatment Modalities: Medication Management, Group therapy, Case management,  ?1 to 1 session with clinician, Psychoeducation, Recreational therapy. ? ? ?Physician Treatment Plan for Primary Diagnosis: Schizoaffective disorder, bipolar type ([place]) ?Long Term Goal(s):    ? ?Short Term Goals:   ? ?Medication Management: Evaluate patient's response, side effects, and tolerance of medication regimen. ? ?Therapeutic Interventions: 1 to 1 sessions, Unit Group sessions and Medication administration. ? ?Evaluation of Outcomes: Not Met ? ?  Physician Treatment Plan for Secondary Diagnosis: Principal Problem: ?  Schizoaffective disorder, bipolar type ([place]) ?Active Problems: ?  Tobacco abuse ?  Generalized anxiety disorder ?  Alcohol abuse ?  Vitamin D deficiency ?  Cocaine abuse ([place]) ? ?Long Term Goal(s):    ? ?Short Term Goals:      ? ?Medication Management: Evaluate patient's response, side effects, and tolerance of medication regimen. ? ?Therapeutic Interventions: 1 to 1 sessions, Unit Group sessions and Medication administration. ? ?Evaluation of Outcomes: Not Met ? ? ?RN Treatment Plan for Primary Diagnosis: Schizoaffective disorder, bipolar type ([place]) ?Long Term Goal(s): Knowledge of disease and therapeutic regimen to maintain  health will improve ? ?Short Term Goals: Ability to remain free from injury will improve, Ability to participate in decision making will improve, Ability to verbalize feelings will improve, Ability to disclose and discuss suicidal ideas, and Ability to identify and develop effective coping behaviors will improve ? ?Medication Management: RN will administer medications as ordered by provider, will assess and evaluate patient's response and provide education to patient for prescribed medication. RN will report any adverse and/or side effects to prescribing provider. ? ?Therapeutic Interventions: 1 on 1 counseling sessions, Psychoeducation, Medication administration, Evaluate responses to treatment, Monitor vital signs and CBGs as ordered, Perform/monitor CIWA, COWS, AIMS and Fall Risk screenings as ordered, Perform wound care treatments as ordered. ? ?Evaluation of Outcomes: Not Met ? ? ?LCSW Treatment Plan for Primary Diagnosis: Schizoaffective disorder, bipolar type ([place]) ?Long Term Goal(s): Safe transition to appropriate next level of care at discharge, Engage patient in therapeutic group addressing interpersonal concerns. ? ?Short Term Goals: Engage patient in aftercare planning with referrals and resources, Increase social support, Increase emotional regulation, Facilitate acceptance of mental health diagnosis and concerns, Identify triggers associated with mental health/substance abuse issues, and Increase skills for wellness and recovery ? ?Therapeutic Interventions: Assess for all discharge needs, 1 to 1 time with Education officer, museum, Explore available resources and support systems, Assess for adequacy in community support network, Educate family and significant other(s) on suicide prevention, Complete Psychosocial Assessment, Interpersonal group therapy. ? ?Evaluation of Outcomes: Not Met ? ? ?Progress in Treatment: ?Attending groups: Yes. ?Participating in groups: Yes. ?Taking medication as prescribed:  Yes. ?Toleration medication: Yes. ?Family/Significant other contact made: Yes, individual(s) contacted:  If consents are provided  ?Patient understands diagnosis: Yes. ?Discussing patient identified problems/goals with staff: Yes. ?Medical problems stabilized or resolved: Yes. ?Denies suicidal/homicidal ideation: Yes. ?Issues/concerns per patient self-inventory: No. ? ? ?New problem(s) identified: No, Describe:  None  ? ?New Short Term/Long Term Goal(s): medication stabilization, elimination of SI thoughts, development of comprehensive mental wellness plan.  ? ?Patient Goals: "To stay focused and in recovery" ? ?Discharge Plan or Barriers: Patient recently admitted. CSW will continue to follow and assess for appropriate referrals and possible discharge planning.  ? ?Reason for Continuation of Hospitalization: Depression ?Hallucinations ?Homicidal ideation ?Medication stabilization ?Suicidal ideation ? ?Estimated Length of Stay: 3 to 5 days  ? ? ?Scribe for Treatment Team: ?Darleen Crocker, Latanya Presser ?03/16/2021 ?2:22 PM ?

## 2021-03-16 NOTE — Group Note (Signed)
LCSW Aftercare Discharge Planning Group Note ? ? ?Type of Group and Topic: Psychoeducational Group: Discharge Planning ? ?Participation Level: Active ? ?Description of Group ? ?Discharge planning group reviews patient's anticipated discharge plans and assists patients to anticipate and address any barriers to wellness/recovery in the community. Suicide prevention education is reviewed with patients in group. ? ?Therapeutic Goals ? ?1. Patients will state their anticipated discharge plan and mental health aftercare ? ?2. Patients will identify potential barriers to wellness in the community setting ? ?3. Patients will engage in problem solving, solution focused discussion of ways to anticipate and address barriers to wellness/recovery ? ? ?Plan for Discharge/Comments: Patient participated appropriately in group.  Patient identified that he would like to work on letting go of relationships that haven't been healthy in his life.  Patient identified his NA meetings as supportive and that he would continue to follow recommendations upon discharge.  ? ? ?Brently Voorhis, LCSW, LCAS ?Clincal Social Worker  ?[facility] ? ? ?
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
## 2021-03-16 NOTE — Progress Notes (Signed)
?   03/15/21 2040  ?Psych Admission Type (Psych Patients Only)  ?Admission Status Voluntary  ?Psychosocial Assessment  ?Patient Complaints Anxiety;Depression  ?Eye Contact Brief  ?Facial Expression Flat  ?Affect Depressed;Anxious  ?Speech Logical/coherent  ?Interaction Minimal  ?Motor Activity Slow  ?Appearance/Hygiene Unremarkable  ?Behavior Characteristics Cooperative;Guarded;Anxious  ?Mood Depressed;Anxious  ?Thought Process  ?Coherency WDL  ?Content WDL  ?Delusions None reported or observed  ?Perception Hallucinations  ?Hallucination Auditory  ?Judgment UTA  ?Confusion None  ?Danger to Self  ?Current suicidal ideation? Denies  ?Danger to Others  ?Danger to Others None reported or observed  ? ?Pt seen first in bed sleeping and then in dayroom. Pt denies SI, HI, VH. Pt endorses AH but doesn't elaborate on their nature. Just stated that they are present. Pt rates pain 10/10 in his right foot as severe neuropathy. Pt rates anxiety and depression both 8/10. Pt stated that he is used to taking Gabapentin 300 mg during the day and 600 mg at night. Pt PTA med list says 300 mg twice daily. Pt encouraged to reach out to provider in the morning to verify the dosage given to him by his provider. Pt stated that he has been on this dose regimen for the past 6 months.  ?

## 2021-03-16 NOTE — [facility] Group Notes (Addendum)
Adult Psychoeducational Group Note ? ?Date:  03/16/2021 ?Time:  11:13 AM ? ?Group Topic/Focus:  ?Goals Group:   The focus of this group is to help patients establish daily goals to achieve during treatment and discuss how the patient can incorporate goal setting into their daily lives to aide in recovery. ? ?Participation Level:  Active ? ?Participation Quality:  Appropriate ? ?Affect:  Appropriate ? ?Cognitive:  Appropriate ? ?Insight: Appropriate ? ?Engagement in Group:  Engaged ? ?Modes of Intervention:  Discussion ? ?Additional Comments:  Patient did not attend morning orientation group.  ?Iona Coach Rasaan Brotherton ?04/17/163, 11:13 AM ?

## 2021-03-17 LAB — CBC WITH DIFFERENTIAL/PLATELET
Abs Immature Granulocytes: 0.01 10*3/uL (ref 0.00–0.07)
Basophils Absolute: 0 10*3/uL (ref 0.0–0.1)
Basophils Relative: 1 %
Eosinophils Absolute: 0.2 10*3/uL (ref 0.0–0.5)
Eosinophils Relative: 4 %
HCT: 43.6 % (ref 39.0–52.0)
Hemoglobin: 14.4 g/dL (ref 13.0–17.0)
Immature Granulocytes: 0 %
Lymphocytes Relative: 48 %
Lymphs Abs: 2.6 10*3/uL (ref 0.7–4.0)
MCH: 30.4 pg (ref 26.0–34.0)
MCHC: 33 g/dL (ref 30.0–36.0)
MCV: 92.2 fL (ref 80.0–100.0)
Monocytes Absolute: 0.4 10*3/uL (ref 0.1–1.0)
Monocytes Relative: 7 %
Neutro Abs: 2.1 10*3/uL (ref 1.7–7.7)
Neutrophils Relative %: 40 %
Platelets: 200 10*3/uL (ref 150–400)
RBC: 4.73 MIL/uL (ref 4.22–5.81)
RDW: 13.2 % (ref 11.5–15.5)
WBC: 5.2 10*3/uL (ref 4.0–10.5)
nRBC: 0 % (ref 0.0–0.2)

## 2021-03-17 LAB — LIPASE, BLOOD: Lipase: 62 U/L — ABNORMAL HIGH (ref 11–51)

## 2021-03-17 LAB — HEPATIC FUNCTION PANEL
ALT: 104 U/L — ABNORMAL HIGH (ref 0–44)
AST: 81 U/L — ABNORMAL HIGH (ref 15–41)
Albumin: 4.3 g/dL (ref 3.5–5.0)
Alkaline Phosphatase: 90 U/L (ref 38–126)
Bilirubin, Direct: 0.1 mg/dL (ref 0.0–0.2)
Indirect Bilirubin: 0.2 mg/dL — ABNORMAL LOW (ref 0.3–0.9)
Total Bilirubin: 0.3 mg/dL (ref 0.3–1.2)
Total Protein: 7.7 g/dL (ref 6.5–8.1)

## 2021-03-17 LAB — LIPID PANEL
Cholesterol: 189 mg/dL (ref 0–200)
HDL: 44 mg/dL (ref >40)
LDL Cholesterol: 75 mg/dL (ref 0–99)
Total CHOL/HDL Ratio: 4.3 RATIO
Triglycerides: 349 mg/dL — ABNORMAL HIGH (ref <150)
VLDL: 70 mg/dL — ABNORMAL HIGH (ref 0–40)

## 2021-03-17 LAB — VITAMIN B12: Vitamin B-12: 287 pg/mL (ref 180–914)

## 2021-03-17 MED ORDER — NICOTINE 14 MG/24HR TD PT24
14.0000 mg | MEDICATED_PATCH | Freq: Every day | TRANSDERMAL | Status: DC
  Administered 2021-03-17: 12:00:00 14 mg via TRANSDERMAL
  Filled 2021-03-17 (×7): qty 1

## 2021-03-17 MED ORDER — QUETIAPINE FUMARATE 200 MG PO TABS
200.0000 mg | ORAL_TABLET | Freq: Every day | ORAL | Status: DC
  Administered 2021-03-17 – 2021-03-20 (×4): 200 mg via ORAL
  Filled 2021-03-17 (×6): qty 1

## 2021-03-17 MED ORDER — QUETIAPINE FUMARATE 50 MG PO TABS
150.0000 mg | ORAL_TABLET | Freq: Every day | ORAL | Status: DC
  Filled 2021-03-17 (×2): qty 1

## 2021-03-17 MED ORDER — GABAPENTIN 300 MG PO CAPS
300.0000 mg | ORAL_CAPSULE | Freq: Three times a day (TID) | ORAL | Status: DC
  Administered 2021-03-17 – 2021-03-21 (×9): 300 mg via ORAL
  Filled 2021-03-17 (×17): qty 1

## 2021-03-17 MED ORDER — TRAZODONE HCL 100 MG PO TABS
100.0000 mg | ORAL_TABLET | Freq: Every day | ORAL | Status: DC
  Administered 2021-03-17 – 2021-03-20 (×4): 100 mg via ORAL
  Filled 2021-03-17 (×6): qty 1

## 2021-03-17 NOTE — [facility] Suicide Risk Assessment (Signed)
[facility] INPATIENT:  Family/Significant Other Suicide Prevention Education ? ?Suicide Prevention Education:  ?Patient Refusal for Family/Significant Other Suicide Prevention Education: The patient Dylan [name] has refused to provide written consent for family/significant other to be provided Family/Significant Other Suicide Prevention Education during admission and/or prior to discharge.  Physician notified. ? ?Royce Sciara E Jamiaya Bina ?03/17/2021, 3:44 PM ?
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
## 2021-03-17 NOTE — Progress Notes (Signed)
?   03/16/21 2100  ?Psych Admission Type (Psych Patients Only)  ?Admission Status Voluntary  ?Psychosocial Assessment  ?Patient Complaints Depression  ?Eye Contact Brief  ?Facial Expression Flat  ?Affect Anxious  ?Speech Logical/coherent  ?Interaction Assertive  ?Motor Activity Slow  ?Appearance/Hygiene Improved  ?Behavior Characteristics Cooperative;Appropriate to situation  ?Mood Depressed  ?Thought Process  ?Coherency WDL  ?Content WDL  ?Delusions None reported or observed  ?Perception WDL  ?Hallucination None reported or observed  ?Judgment Poor  ?Confusion None  ?Danger to Self  ?Current suicidal ideation? Denies  ?Danger to Others  ?Danger to Others None reported or observed  ? ? ?

## 2021-03-17 NOTE — [facility] Group Notes (Signed)
[facility] Group Notes:  (Nursing/MHT/Case Management/Adjunct) ? ?Date:  03/17/2021  ?Time:  8:51 PM ? ?Type of Therapy:  Group Therapy ? ?Participation Level:  Active ? ?Participation Quality:  Appropriate ? ?Affect:  Appropriate ? ?Cognitive:  Appropriate ? ?Insight:  Appropriate ? ?Engagement in Group:  Engaged ? ?Modes of Intervention:  Discussion ? ?Summary of Progress/Problems: ? ?Dylan [name] ?03/17/2021, 8:51 PM ?

## 2021-03-17 NOTE — Progress Notes (Signed)
[facility] MD Progress Note  03/17/2021 12:21 PM Dylan [name]  MRN:  [id number] Subjective:  Patient is a 56 year old male with past psychiatric history of schizoaffective disorder bipolar type, GAD, alcohol abuse, tobacco abuse, cocaine abuse and past medical history of HIV, hep B, degenerative disc disease, history of rhabdomyolysis, prediabetes and liver lesion who initially presented to [facility] on 03/13/2021 with suicidal ideations with a plan to shoot himself with a double barrel shotgun and auditory hallucinations.  Chart review from last 24 hours-The patient's chart was reviewed and nursing notes were reviewed. Patient discussed in progression rounds with treatment team. MAR was reviewed and Pt is complaint  with scheduled medications and required PRN medications hydroxyzine x 1 , Maalox x1 and trazodone x 1 yesterday.  Pt is seen and examined today. Pt states his mood is up and down . He states that he still feels depressed when he thinks about bad thoughts.  He still reports anxiety but it is improving.  He did not sleep well last night.  He states it took a while for him to fall asleep and he slept at 2 PM.  Pt states his appetite is good .  He is reporting pain in his foot and states the pain worsens as the day goes by.  He states that he called his surgeon Dr. Sharol Given who told him that he will need repeat surgery and he should call back to schedule for his surgery.  Discussed that he needs a walker that will help him with walking.  I told him that I will ask the nursing staff if they can provide him a walker.  Discussed that we will increase his medications to help him with depression, anxiety and sleep.  He verbalizes understanding.  Currently, Pt denies any suicidal ideation, homicidal ideation and, visual and auditory hallucination. Pt denies any physical symptoms other than pain in his foot. Pt denies any medication side effects and has been tolerating it well. Pt denies any concerns.   Principal  Problem: Schizoaffective disorder, bipolar type ([place]) Diagnosis: Principal Problem:   Schizoaffective disorder, bipolar type ([place]) Active Problems:   Tobacco abuse   Generalized anxiety disorder   Alcohol abuse   Vitamin D deficiency   Cocaine abuse ([place])  Total Time spent with patient: 30 minutes I personally spent 30 minutes on the unit in direct patient care. The direct patient care time included face-to-face time with the patient, reviewing the patient's chart, communicating with other professionals, and coordinating care. Greater than 50% of this time was spent in counseling or coordinating care with the patient regarding goals of hospitalization, psycho-education, and discharge planning needs.  Past Psychiatric History: see H&P  Past Medical History:  Past Medical History:  Diagnosis Date   Anxiety    Arthritis    Bipolar 1 disorder ([place])    Colon polyps    Depression    GERD (gastroesophageal reflux disease)    Hepatitis B    Human immunodeficiency virus (HIV) ([place])    Hyperlipidemia    Hypertension    Neuromuscular disorder ([place])    neuropathy   Neuropathy    Pre-diabetes    Schizophrenia ([place])     Past Surgical History:  Procedure Laterality Date   FOOT ARTHRODESIS Right 09/17/2019   Procedure: FUSION RIGHT LISFRANC JOINT;  Surgeon: Newt Minion, MD;  Location: [facility];  Service: Orthopedics;  Laterality: Right;   FOOT ARTHRODESIS Right 09/2019   HEMORROIDECTOMY     IR FLUORO GUIDE  CV LINE RIGHT  02/22/2017   IR US GUIDE VASC ACCESS RIGHT  02/22/2017   TUMOR REMOVAL     From Chest   Family History:  Family History  Problem Relation Age of Onset   CAD Mother    Diabetes Father    Colon cancer Maternal Aunt 60   Diabetes Maternal Aunt    Heart disease Maternal Uncle    Esophageal cancer Neg Hx    Rectal cancer Neg Hx    Stomach cancer Neg Hx    Family Psychiatric  History: see H&P Social History:  Social History   Substance and Sexual Activity  Alcohol  Use Yes   Comment: occassional drinker     Social History   Substance and Sexual Activity  Drug Use Yes   Types: Cocaine   Comment: last use was Friday 03/05/2020    Social History   Socioeconomic History   Marital status: Married    Spouse name: Not on file   Number of children: 1   Years of education: Not on file   Highest education level: Not on file  Occupational History   Not on file  Tobacco Use   Smoking status: Every Day    Packs/day: 0.50    Years: 25.00    Pack years: 12.50    Types: E-cigarettes, Cigarettes   Smokeless tobacco: Never   Tobacco comments:    has patches when he is ready to quit  Vaping Use   Vaping Use: Every day   Start date: 12/09/2020   Substances: Nicotine  Substance and Sexual Activity   Alcohol use: Yes    Comment: occassional drinker   Drug use: Yes    Types: Cocaine    Comment: last use was Friday 03/05/2020   Sexual activity: Yes    Partners: Female    Comment: condoms given  Other Topics Concern   Not on file  Social History Narrative   Not on file   Social Determinants of Health   Financial Resource Strain: Not on file  Food Insecurity: Not on file  Transportation Needs: Not on file  Physical Activity: Not on file  Stress: Not on file  Social Connections: Not on file   Additional Social History:                         Sleep: Fair  Appetite:  Good  Current Medications: Current Facility-Administered Medications  Medication Dose Route Frequency Provider Last Rate Last Admin   alum & mag hydroxide-simeth (MAALOX/MYLANTA) 200-200-20 MG/5ML suspension 30 mL  30 mL Oral Q4H PRN Revonda Humphrey, NP   30 mL at 03/16/21 1717   bictegravir-emtricitabine-tenofovir AF (BIKTARVY) 50-200-25 MG per tablet 1 tablet  1 tablet Oral Daily Revonda Humphrey, NP   1 tablet at 03/17/21 0825   gabapentin (NEURONTIN) capsule 300 mg  300 mg Oral TID Armando Reichert, MD       hydrOXYzine (ATARAX) tablet 25 mg  25 mg Oral TID PRN  Revonda Humphrey, NP   25 mg at 03/16/21 2133   ibuprofen (ADVIL) tablet 400 mg  400 mg Oral Q6H PRN Massengill, Ovid Curd, MD   400 mg at 03/17/21 0827   OLANZapine zydis (ZYPREXA) disintegrating tablet 10 mg  10 mg Oral Q8H PRN Revonda Humphrey, NP       And   LORazepam (ATIVAN) tablet 1 mg  1 mg Oral PRN Revonda Humphrey, NP  And   ziprasidone (GEODON) injection 20 mg  20 mg Intramuscular PRN Revonda Humphrey, NP       magnesium hydroxide (MILK OF MAGNESIA) suspension 30 mL  30 mL Oral Daily PRN Revonda Humphrey, NP       nicotine (NICODERM CQ - dosed in mg/24 hours) patch 14 mg  14 mg Transdermal Daily Massengill, Nathan, MD   14 mg at 03/17/21 1157   QUEtiapine (SEROQUEL) tablet 150 mg  150 mg Oral QHS Armando Reichert, MD       traZODone (DESYREL) tablet 100 mg  100 mg Oral QHS Armando Reichert, MD       traZODone (DESYREL) tablet 50 mg  50 mg Oral QHS PRN Revonda Humphrey, NP   50 mg at 03/16/21 2133    Lab Results:  Results for orders placed or performed during the hospital encounter of 03/13/21 (from the past 48 hour(s))  POC SARS Coronavirus 2 Ag-ED - Nasal Swab     Status: Normal   Collection Time: 03/15/21  1:46 PM  Result Value Ref Range   SARS Coronavirus 2 Ag Negative Negative  POC SARS Coronavirus 2 Ag     Status: None   Collection Time: 03/15/21  1:52 PM  Result Value Ref Range   SARSCOV2ONAVIRUS 2 AG NEGATIVE NEGATIVE    Comment: (NOTE) SARS-CoV-2 antigen NOT DETECTED.   Negative results are presumptive.  Negative results do not preclude SARS-CoV-2 infection and should not be used as the sole basis for treatment or other patient management decisions, including infection  control decisions, particularly in the presence of clinical signs and  symptoms consistent with COVID-19, or in those who have been in contact with the virus.  Negative results must be combined with clinical observations, patient history, and epidemiological information. The expected result  is Negative.  Fact Sheet for Patients: HandmadeRecipes.com.cy  Fact Sheet for Healthcare Providers: FuneralLife.at  This test is not yet approved or cleared by the [place] FDA and  has been authorized for detection and/or diagnosis of SARS-CoV-2 by FDA under an Emergency Use Authorization (EUA).  This EUA will remain in effect (meaning this test can be used) for the duration of  the COV ID-19 declaration under Section 564(b)(1) of the Act, 21 U.S.C. section 360bbb-3(b)(1), unless the authorization is terminated or revoked sooner.      Blood Alcohol level:  Lab Results  Component Value Date   ETH <10 03/13/2021   ETH <10 11/02/8525    Metabolic Disorder Labs: Lab Results  Component Value Date   HGBA1C 5.7 (H) 03/13/2021   MPG 116.89 03/13/2021   MPG 125.5 10/26/2020   Lab Results  Component Value Date   PROLACTIN 12.1 03/08/2020   PROLACTIN 2.6 (L) 11/09/2019   Lab Results  Component Value Date   CHOL 196 03/13/2021   TRIG 62 03/13/2021   HDL 56 03/13/2021   CHOLHDL 3.5 03/13/2021   VLDL 12 03/13/2021   LDLCALC 128 (H) 03/13/2021   LDLCALC 191 (H) 01/07/2021    Physical Findings: AIMS:  , ,  ,  ,    CIWA:    COWS:     Musculoskeletal: Strength & Muscle Tone: within normal limits Gait & Station:  some problem with walking due to Foot injury Patient leans: N/A  Psychiatric Specialty Exam:  Presentation  General Appearance: Appropriate for Environment; Casual  Eye Contact:Fair  Speech:Clear and Coherent  Speech Volume:Normal  Handedness:Right   Mood and Affect  Mood:Depressed; Anxious  Affect:Congruent;  Constricted   Thought Process  Thought Processes:Coherent  Descriptions of Associations:Intact  Orientation:Full (Time, Place and Person)  Thought Content:Logical  History of Schizophrenia/Schizoaffective disorder:Yes  Duration of Psychotic Symptoms:Greater than six  months  Hallucinations:Hallucinations: None Description of Auditory Hallucinations: NOne  Ideas of Reference:None  Suicidal Thoughts:Suicidal Thoughts: No  Homicidal Thoughts:Homicidal Thoughts: No   Sensorium  Memory:Immediate Good; Recent Fair  Judgment:Poor  Insight:Fair   Executive Functions  Concentration:Fair  Attention Span:Good  [place] of Knowledge:Good  Language:Good   Psychomotor Activity  Psychomotor Activity:Psychomotor Activity: Normal   Assets  Assets:Communication Skills; Desire for Improvement; Financial Resources/Insurance; Physical Health; Resilience   Sleep  Sleep:Sleep: Fair    Physical Exam: Physical Exam Review of Systems  Constitutional:  Negative for chills and fever.  Respiratory:  Negative for shortness of breath.   Cardiovascular:  Negative for chest pain.  Gastrointestinal:  Negative for abdominal pain, diarrhea, nausea and vomiting.  Musculoskeletal:        Foot pain   Neurological:  Negative for dizziness and headaches.  Psychiatric/Behavioral:  Positive for depression and substance abuse. Negative for hallucinations and suicidal ideas. The patient is nervous/anxious and has insomnia.   Blood pressure 120/77, pulse 63, temperature 97.7 F (36.5 C), temperature source Oral, resp. rate 18, height 6\' 2"  (1.88 m), weight 105.2 kg, SpO2 96 %. Body mass index is 29.79 kg/m.   Treatment Plan Summary: Patient is a 56 year old male with past psychiatric history of schizoaffective disorder bipolar type, GAD, alcohol abuse, tobacco abuse, cocaine abuse and past medical history of HIV, hep B, degenerative disc disease, history of rhabdomyolysis, prediabetes and liver lesion who initially presented to [facility] on 03/13/2021 with suicidal ideations with a plan to shoot himself with a double barrel shotgun and auditory hallucinations.  Patient is still feeling depressed and anxious with poor sleep.  Also complaining of pain in  foot due to history of injury.  Will increase Seroquel to help with sleep and gabapentin to help with foot pain.  Will add scheduled trazodone to help with sleep.  Patient will need a walker to decrease weightbearing on injured foot.  We will continue to monitor symptoms. Daily contact with patient to assess and evaluate symptoms and progress in treatment   Labs reviewed  Pending labs vitamin B12 level, RPR, lipid panel, lipase, hepatitis panel acute, hepatic function panel, CBC   Schizoaffective disorder-bipolar type current episode depressed GAD -Continue to hold Cymbalta due to high liver enzymes. -Increase home Seroquel 150 mg nightly -Increase gabapentin 300 mg 3 times daily -Continue agitation protocol with Zyprexa/Ativan/Geodon -Start trazodone 100 mg nightly.  Cocaine abuse  alcohol use -Encouraged cessation.   Medical issues HIV Continue Biktarvy   Hep B Elevated liver enzymes -Trend liver enzymes  -hepatitis panel acute, hepatic function panel ordered-pending -Do not give Tylenol due to high liver enzymes.   Foot injury and pain  -Patient to make appointment for foot surgery -Follow-up with Dr. Sharol Given on outpatient basis -Order walker -Gabapentin increased which also helps with foot pain -Patient can take Advil as needed.  No Tylenol due to high liver enzymes  PRN's  -Continue Advil 400 mg every 6 hours as needed for fever, headache, pain -Continue Milk of Magnesia 30 ml PRN Daily for Constipation. -Continue Maalox/Mylanta 30 ml Q4H PRN for Indigestion. -Continue Hydroxyzine 25 mg TID PRN for Anxiety. -Continue Trazodone 50 mg QHS PRN for sleep in addition to scheduled trazodone 100 mg nightly.  Armando Reichert, MD PGY  2 03/17/2021, 12:21 PM

## 2021-03-17 NOTE — Progress Notes (Addendum)
D. Pt complained of poor sleep last night due to right foot pain. Pt agreeable to walker to assist with walking. Pt rated his depression and anxiety both 5/10, and denied SI/HI and A/VH. Pt has been visible in the milieu interacting appropriately with peers, and observed attending groups. A. Labs and vitals monitored. Pt given and educated on medications. Pt supported emotionally and encouraged to express concerns and ask questions.   ?R. Pt remains safe with 15 minute checks. Will continue POC. ? ?  ?

## 2021-03-18 LAB — RPR: RPR Ser Ql: NONREACTIVE

## 2021-03-18 MED ORDER — QUETIAPINE FUMARATE ER 50 MG PO TB24
50.0000 mg | ORAL_TABLET | Freq: Every day | ORAL | Status: DC
  Administered 2021-03-19 – 2021-03-21 (×2): 50 mg via ORAL
  Filled 2021-03-18 (×4): qty 1

## 2021-03-18 MED ORDER — PRAZOSIN HCL 1 MG PO CAPS
1.0000 mg | ORAL_CAPSULE | Freq: Every day | ORAL | Status: DC
Start: 2021-03-18 — End: 2021-03-21
  Administered 2021-03-18 – 2021-03-20 (×3): 1 mg via ORAL
  Filled 2021-03-18 (×5): qty 1

## 2021-03-18 NOTE — Progress Notes (Signed)
Pt denies SI/HI/AVH and verbally agrees to approach staff if these become apparent or before harming themselves/others. Rates depression 8/10. Rates anxiety 7/10. Rates pain 10/10 in foot. Pt stated that after he was given the heat pack, it made his foot feel so much better. Pt has been out of room for a lot of the day. Pt sang in [place] and he enjoyed it. Scheduled medications administered to pt, per MD orders. RN provided support and encouragement to pt. Q15 min safety checks implemented and continued. Pt safe on the unit. RN will continue to monitor and intervene as needed.  ? 03/18/21 0830  ?Psych Admission Type (Psych Patients Only)  ?Admission Status Voluntary  ?Psychosocial Assessment  ?Patient Complaints Anxiety;Depression  ?Eye Contact Fair  ?Facial Expression Flat;Sad  ?Affect Sad;Flat  ?Speech Logical/coherent  ?Interaction Assertive  ?Motor Activity Slow  ?Appearance/Hygiene Unremarkable  ?Behavior Characteristics Cooperative;Appropriate to situation;Anxious  ?Mood Depressed;Pleasant;Sad  ?Thought Process  ?Coherency WDL  ?Content WDL  ?Delusions None reported or observed  ?Perception WDL  ?Hallucination None reported or observed  ?Judgment Limited  ?Confusion None  ?Danger to Self  ?Current suicidal ideation? Denies  ?Danger to Others  ?Danger to Others None reported or observed  ? ? ?

## 2021-03-18 NOTE — Group Note (Signed)
Type of Therapy and Topic:  Group Therapy:  Self-Esteem ?  ?Participation Level:  Active ? ?Description of Group: ?This group addressed positive self-esteem. Patients were given a worksheet with a blank shield. Patients were asked what a shield is and when it is used. Patients were asked to list, draw, or write protective factors in the their lives on their shields. Patients discussed the words, ideas and drawings that they put on their shield. Patients were encouraged to have a daily reflection of positive characteristics/ protective factors. ? ?Therapeutic Goals ?Patient will verbalize two of their positive qualities ?Patient will demonstrate insight but naming social supports in their lives ?Patient will verbalize their feelings when voicing positive self affirmations and when voicing positive affirmations of others ?Patients will discuss the potential positive impact on their wellness/recovery of focusing on positive traits of self and others. ? ?Summary of Patient Progress: Patient actively and appropriately participated in group.  Patient reports that a support in his life is his sister.  Patient participated in activity to identify protective factors in her life.  Patient was open and provided feedback to peers regarding group topic.  ? ? ?Gethsemane Fischler, LCSW, LCAS ?Clincal Social Worker  ?[facility] ? ? ?

## 2021-03-18 NOTE — Progress Notes (Signed)
?   03/18/21 2143  ?Psych Admission Type (Psych Patients Only)  ?Admission Status Voluntary  ?Psychosocial Assessment  ?Patient Complaints Anxiety;Depression  ?Eye Contact Fair  ?Facial Expression Sad  ?Affect Sad;Flat  ?Speech Logical/coherent  ?Interaction Assertive  ?Motor Activity Slow  ?Appearance/Hygiene Unremarkable  ?Behavior Characteristics Cooperative;Appropriate to situation  ?Mood Depressed;Sad  ?Thought Process  ?Coherency WDL  ?Content WDL  ?Delusions None reported or observed  ?Perception WDL  ?Hallucination None reported or observed  ?Judgment Limited  ?Confusion None  ?Danger to Self  ?Current suicidal ideation? Denies  ?Danger to Others  ?Danger to Others None reported or observed  ? ? ?

## 2021-03-18 NOTE — Progress Notes (Signed)
[facility] MD Progress Note  03/18/2021 4:26 PM Dylan [name]  MRN:  [id number] Subjective:  Patient is a 56 year old male with past psychiatric history of schizoaffective disorder bipolar type, GAD, alcohol abuse, tobacco abuse, cocaine abuse and past medical history of HIV, hep B, degenerative disc disease, history of rhabdomyolysis, prediabetes and liver lesion who initially presented to [place] on 03/13/2021 with suicidal ideations with a plan to shoot himself with a double barrel shotgun and auditory hallucinations.  Chart review from last 24 hours-The patient's chart was reviewed and nursing notes were reviewed. Patient discussed in progression rounds with treatment team. MAR was reviewed and Pt is complaint  with scheduled medications except he refused nicotine patch today and required PRN medications Atarax x1, and Advil x 2.  Pt is seen today. Pt states his mood is better.  He states he was not feeling great this morning due to weather.  He states whenever it is rainy he feels more pain in his foot.  He is able to walk better with the help of walker. Discussed that we also increased gabapentin  3 times daily which will also helped with his foot pain.  He states his doctor will schedule him for surgery soon. He slept well last night with scheduled trazodone and Seroquel.  He reports stable appetite.  He states he feels less anxious today and denies any alcohol or cocaine cravings. Currently, Pt denies any active or passive suicidal ideation, homicidal ideation and, visual and auditory hallucination. Pt denies any physical symptoms other than pain in his foot.  He has been attending and participating in groups. Pt denies any physical symptoms other than pain in his foot. Pt denies any medication side effects and has been tolerating it well. Pt denies any concerns. Later this afternoon, attending talked to patient that he was given Seroquel 50 mg this morning accidentally.  He stated that he liked morning dose  of Seroquel as it reduced his anxiety.  He is requesting to continue morning dose of Seroquel. Principal Problem: Schizoaffective disorder, bipolar type ([place]) Diagnosis: Principal Problem:   Schizoaffective disorder, bipolar type ([place]) Active Problems:   Tobacco abuse   Generalized anxiety disorder   Alcohol abuse   Vitamin D deficiency   Cocaine abuse ([place])  Total Time spent with patient: 30 minutes I personally spent 30 minutes on the unit in direct patient care. The direct patient care time included face-to-face time with the patient, reviewing the patient's chart, communicating with other professionals, and coordinating care. Greater than 50% of this time was spent in counseling or coordinating care with the patient regarding goals of hospitalization, psycho-education, and discharge planning needs.  Past Psychiatric History: see H&P  Past Medical History:  Past Medical History:  Diagnosis Date   Anxiety    Arthritis    Bipolar 1 disorder ([place])    Colon polyps    Depression    GERD (gastroesophageal reflux disease)    Hepatitis B    Human immunodeficiency virus (HIV) ([place])    Hyperlipidemia    Hypertension    Neuromuscular disorder ([place])    neuropathy   Neuropathy    Pre-diabetes    Schizophrenia ([place])     Past Surgical History:  Procedure Laterality Date   FOOT ARTHRODESIS Right 09/17/2019   Procedure: FUSION RIGHT LISFRANC JOINT;  Surgeon: Newt Minion, MD;  Location: [place];  Service: Orthopedics;  Laterality: Right;   FOOT ARTHRODESIS Right 09/2019   HEMORROIDECTOMY     IR  FLUORO GUIDE CV LINE RIGHT  02/22/2017   IR US GUIDE VASC ACCESS RIGHT  02/22/2017   TUMOR REMOVAL     From Chest   Family History:  Family History  Problem Relation Age of Onset   CAD Mother    Diabetes Father    Colon cancer Maternal Aunt 60   Diabetes Maternal Aunt    Heart disease Maternal Uncle    Esophageal cancer Neg Hx    Rectal cancer Neg Hx    Stomach cancer Neg Hx    Family  Psychiatric  History: see H&P Social History:  Social History   Substance and Sexual Activity  Alcohol Use Yes   Comment: occassional drinker     Social History   Substance and Sexual Activity  Drug Use Yes   Types: Cocaine   Comment: last use was Friday 03/05/2020    Social History   Socioeconomic History   Marital status: Married    Spouse name: Not on file   Number of children: 1   Years of education: Not on file   Highest education level: Not on file  Occupational History   Not on file  Tobacco Use   Smoking status: Every Day    Packs/day: 0.50    Years: 25.00    Pack years: 12.50    Types: E-cigarettes, Cigarettes   Smokeless tobacco: Never   Tobacco comments:    has patches when he is ready to quit  Vaping Use   Vaping Use: Every day   Start date: 12/09/2020   Substances: Nicotine  Substance and Sexual Activity   Alcohol use: Yes    Comment: occassional drinker   Drug use: Yes    Types: Cocaine    Comment: last use was Friday 03/05/2020   Sexual activity: Yes    Partners: Female    Comment: condoms given  Other Topics Concern   Not on file  Social History Narrative   Not on file   Social Determinants of Health   Financial Resource Strain: Not on file  Food Insecurity: Not on file  Transportation Needs: Not on file  Physical Activity: Not on file  Stress: Not on file  Social Connections: Not on file   Additional Social History:                         Sleep: Good  Appetite:  Good  Current Medications: Current Facility-Administered Medications  Medication Dose Route Frequency Provider Last Rate Last Admin   alum & mag hydroxide-simeth (MAALOX/MYLANTA) 200-200-20 MG/5ML suspension 30 mL  30 mL Oral Q4H PRN Revonda Humphrey, NP   30 mL at 03/16/21 1717   bictegravir-emtricitabine-tenofovir AF (BIKTARVY) 50-200-25 MG per tablet 1 tablet  1 tablet Oral Daily Revonda Humphrey, NP   1 tablet at 03/18/21 0900   gabapentin (NEURONTIN)  capsule 300 mg  300 mg Oral TID Armando Reichert, MD   300 mg at 03/18/21 1139   hydrOXYzine (ATARAX) tablet 25 mg  25 mg Oral TID PRN Revonda Humphrey, NP   25 mg at 03/17/21 1823   ibuprofen (ADVIL) tablet 400 mg  400 mg Oral Q6H PRN Massengill, Ovid Curd, MD   400 mg at 03/18/21 0907   OLANZapine zydis (ZYPREXA) disintegrating tablet 10 mg  10 mg Oral Q8H PRN Revonda Humphrey, NP       And   LORazepam (ATIVAN) tablet 1 mg  1 mg Oral PRN Revonda Humphrey, NP  And   ziprasidone (GEODON) injection 20 mg  20 mg Intramuscular PRN Revonda Humphrey, NP       magnesium hydroxide (MILK OF MAGNESIA) suspension 30 mL  30 mL Oral Daily PRN Revonda Humphrey, NP       nicotine (NICODERM CQ - dosed in mg/24 hours) patch 14 mg  14 mg Transdermal Daily Massengill, Ovid Curd, MD   14 mg at 03/17/21 1157   prazosin (MINIPRESS) capsule 1 mg  1 mg Oral QHS Armando Reichert, MD       [START ON 03/19/2021] QUEtiapine (SEROQUEL XR) 24 hr tablet 50 mg  50 mg Oral Daily Armando Reichert, MD       QUEtiapine (SEROQUEL) tablet 200 mg  200 mg Oral QHS Massengill, Ovid Curd, MD   200 mg at 03/17/21 2115   traZODone (DESYREL) tablet 100 mg  100 mg Oral QHS Armando Reichert, MD   100 mg at 03/17/21 2115   traZODone (DESYREL) tablet 50 mg  50 mg Oral QHS PRN Revonda Humphrey, NP   50 mg at 03/16/21 2133    Lab Results:  Results for orders placed or performed during the hospital encounter of 03/15/21 (from the past 48 hour(s))  Hepatic function panel     Status: Abnormal   Collection Time: 03/17/21  6:34 PM  Result Value Ref Range   Total Protein 7.7 6.5 - 8.1 g/dL   Albumin 4.3 3.5 - 5.0 g/dL   AST 81 (H) 15 - 41 U/L   ALT 104 (H) 0 - 44 U/L   Alkaline Phosphatase 90 38 - 126 U/L   Total Bilirubin 0.3 0.3 - 1.2 mg/dL   Bilirubin, Direct 0.1 0.0 - 0.2 mg/dL   Indirect Bilirubin 0.2 (L) 0.3 - 0.9 mg/dL    Comment: Performed at [facility], [place] [address], [place], [place] [address]  Hepatitis panel,  acute     Status: Abnormal   Collection Time: 03/17/21  6:34 PM  Result Value Ref Range   Hepatitis B Surface Ag Reactive (A) NON REACTIVE    Comment: Sample Positive for HBsAg. Result confirmed by neutralization.   HCV Ab NON REACTIVE NON REACTIVE    Comment: (NOTE) Nonreactive HCV antibody screen is consistent with no HCV infections,  unless recent infection is suspected or other evidence exists to indicate HCV infection.     Hep A IgM NON REACTIVE NON REACTIVE   Hep B C IgM NON REACTIVE NON REACTIVE    Comment: Performed at [facility], [place] [address] [place], [place] [address]  Lipase, blood     Status: Abnormal   Collection Time: 03/17/21  6:34 PM  Result Value Ref Range   Lipase 62 (H) 11 - 51 U/L    Comment: Performed at [facility], [place] [address], [place], [place] [address]  Lipid panel     Status: Abnormal   Collection Time: 03/17/21  6:34 PM  Result Value Ref Range   Cholesterol 189 0 - 200 mg/dL   Triglycerides 349 (H) <150 mg/dL   HDL 44 >40 mg/dL   Total CHOL/HDL Ratio 4.3 RATIO   VLDL 70 (H) 0 - 40 mg/dL   LDL Cholesterol 75 0 - 99 mg/dL    Comment:        Total Cholesterol/HDL:CHD Risk Coronary Heart Disease Risk Table                     Men   Women  1/2 Average Risk   3.4   3.3  Average Risk       5.0   4.4  2 X Average Risk   9.6   7.1  3 X Average Risk  23.4   11.0        Use the calculated Patient Ratio above and the CHD Risk Table to determine the patient's CHD Risk.        ATP III CLASSIFICATION (LDL):  <100     mg/dL   Optimal  100-129  mg/dL   Near or Above                    Optimal  130-159  mg/dL   Borderline  160-189  mg/dL   High  >190     mg/dL   Very High Performed at [place] [address], [place], [place] [address]   RPR     Status: None   Collection Time: 03/17/21  6:34 PM  Result Value Ref Range   RPR Ser Ql NON REACTIVE NON REACTIVE    Comment: Performed at [facility], [place] [address] [place], [place] [address]  Vitamin B12     Status: None   Collection Time: 03/17/21  6:34 PM  Result Value Ref Range   Vitamin B-12 287 180 - 914 pg/mL    Comment: (NOTE) This assay is not validated for testing neonatal or myeloproliferative syndrome specimens for Vitamin B12 levels. Performed at [facility], [place] [address], [place], [place] [address]   CBC with Differential/Platelet     Status: None   Collection Time: 03/17/21  6:34 PM  Result Value Ref Range   WBC 5.2 4.0 - 10.5 K/uL   RBC 4.73 4.22 - 5.81 MIL/uL   Hemoglobin 14.4 13.0 - 17.0 g/dL   HCT 43.6 39.0 - 52.0 %   MCV 92.2 80.0 - 100.0 fL   MCH 30.4 26.0 - 34.0 pg   MCHC 33.0 30.0 - 36.0 g/dL   RDW 13.2 11.5 - 15.5 %   Platelets 200 150 - 400 K/uL   nRBC 0.0 0.0 - 0.2 %   Neutrophils Relative % 40 %   Neutro Abs 2.1 1.7 - 7.7 K/uL   Lymphocytes Relative 48 %   Lymphs Abs 2.6 0.7 - 4.0 K/uL   Monocytes Relative 7 %   Monocytes Absolute 0.4 0.1 - 1.0 K/uL   Eosinophils Relative 4 %   Eosinophils Absolute 0.2 0.0 - 0.5 K/uL   Basophils Relative 1 %   Basophils Absolute 0.0 0.0 - 0.1 K/uL   Immature Granulocytes 0 %   Abs Immature Granulocytes 0.01 0.00 - 0.07 K/uL    Comment: Performed at [facility], [place] [address], [place], [place] [address]    Blood Alcohol level:  Lab Results  Component Value Date   [facility] <10 03/13/2021   ETH <10 29/56/2130    Metabolic Disorder Labs: Lab Results  Component Value Date   HGBA1C 5.7 (H) 03/13/2021   MPG 116.89 03/13/2021   MPG 125.5 10/26/2020   Lab Results  Component Value Date   PROLACTIN 12.1 03/08/2020   PROLACTIN 2.6 (L) 11/09/2019   Lab Results  Component Value Date   CHOL 189 03/17/2021   TRIG 349 (H) 03/17/2021   HDL 44 03/17/2021   CHOLHDL 4.3 03/17/2021   VLDL 70 (H) 03/17/2021   LDLCALC 75 03/17/2021   LDLCALC 128 (H) 03/13/2021  Physical Findings: AIMS: Facial and Oral  Movements Muscles of Facial Expression: None, normal Lips and Perioral Area: None, normal Jaw: None, normal Tongue: None, normal,Extremity Movements Upper (arms, wrists, hands, fingers): None, normal Lower (legs, knees, ankles, toes): None, normal, Trunk Movements Neck, shoulders, hips: None, normal, Overall Severity Severity of abnormal movements (highest score from questions above): None, normal Incapacitation due to abnormal movements: None, normal Patient's awareness of abnormal movements (rate only patient's report): No Awareness, Dental Status Current problems with teeth and/or dentures?: No Does patient usually wear dentures?: No  CIWA:    COWS:     Musculoskeletal: Strength & Muscle Tone: within normal limits Gait & Station:  Currently using walker, some problem with walking without walker due to Foot injury  Patient leans: N/A  Psychiatric Specialty Exam:  Presentation  General Appearance: Casual  Eye Contact:Fair  Speech:Clear and Coherent  Speech Volume:Normal  Handedness:Right   Mood and Affect  Mood:Dysphoric; Anxious  Affect:Congruent; Constricted   Thought Process  Thought Processes:Coherent  Descriptions of Associations:Intact  Orientation:Full (Time, Place and Person)  Thought Content:Logical  History of Schizophrenia/Schizoaffective disorder:Yes  Duration of Psychotic Symptoms:Greater than six months  Hallucinations:Hallucinations: None Description of Auditory Hallucinations: None  Ideas of Reference:None  Suicidal Thoughts:Suicidal Thoughts: No  Homicidal Thoughts:Homicidal Thoughts: No   Sensorium  Memory:Immediate Good; Remote Good  Judgment:Poor  Insight:Fair   Executive Functions  Concentration:Fair  Attention Span:Good  [place] of Knowledge:Good  Language:Good   Psychomotor Activity  Psychomotor Activity:Psychomotor Activity: Normal   Assets  Assets:Communication Skills; Desire for Improvement;  Financial Resources/Insurance; Physical Health; Resilience   Sleep  Sleep:Sleep: Good    Physical Exam: Physical Exam Review of Systems  Constitutional:  Negative for chills and fever.  Respiratory:  Negative for shortness of breath.   Cardiovascular:  Negative for chest pain.  Gastrointestinal:  Negative for abdominal pain, diarrhea, nausea and vomiting.  Musculoskeletal:        Foot pain   Neurological:  Negative for dizziness and headaches.  Psychiatric/Behavioral:  Positive for depression and substance abuse. Negative for hallucinations and suicidal ideas. The patient is nervous/anxious. The patient does not have insomnia.   Blood pressure 125/78, pulse 68, temperature 97.7 F (36.5 C), temperature source Oral, resp. rate 18, height 6\' 2"  (1.88 m), weight 105.2 kg, SpO2 96 %. Body mass index is 29.79 kg/m.   Treatment Plan Summary: Patient is a 56 year old male with past psychiatric history of schizoaffective disorder bipolar type, GAD, alcohol abuse, tobacco abuse, cocaine abuse and past medical history of HIV, hep B, degenerative disc disease, history of rhabdomyolysis, prediabetes and liver lesion who initially presented to [place] on 03/13/2021 with suicidal ideations with a plan to shoot himself with a double barrel shotgun and auditory hallucinations.  Patient is feeling less anxious and depressed.  Sleep has improved.  Patient has been using walker to reduce weightbearing on injured foot.  Liver enzymes trending down.  We will continue to monitor symptoms. Daily contact with patient to assess and evaluate symptoms and progress in treatment   Labs reviewed  New labs :vitamin B12 level (WNL), RPR (NR), lipid panel (triglycerides 349, LDL 75, VLDL 70, cholesterol 189), lipase (high at 62), hepatitis panel acute (hep B surface antigen reactive), hepatic function panel (AST 81/ALT 104 trending down), CBC- within normal limits   Schizoaffective disorder-bipolar type current  episode depressed GAD -Continue to hold Cymbalta due to high liver enzymes. -Continue home Seroquel 200 mg nightly.  NS reported that  patient got extra 50 mg Seroquel this morning accidentally. -Start Seroquel XL 50 mg daily. -Continue gabapentin 300 mg 3 times daily -Continue agitation protocol with Zyprexa/Ativan/Geodon -Continue trazodone 100 mg nightly.  Cocaine abuse  alcohol use -Encouraged cessation.   Medical issues HIV Continue Biktarvy   Hep B Elevated liver enzymes -Trend liver enzymes  -Hepatitis panel acute (positive for hepatitis B surface antigen, hepatic function panel ordered-liver enzymes trending down -Do not give Tylenol due to high liver enzymes.   Foot injury and pain  -Patient to make appointment for foot surgery -Follow-up with Dr. Sharol Given on outpatient basis -Continue gabapentin  -Patient can take Advil as needed.  No Tylenol due to high liver enzymes  PRN's  -Continue Advil 400 mg every 6 hours as needed for fever, headache, pain -Continue Milk of Magnesia 30 ml PRN Daily for Constipation. -Continue Maalox/Mylanta 30 ml Q4H PRN for Indigestion. -Continue Hydroxyzine 25 mg TID PRN for Anxiety. -Continue Trazodone 50 mg QHS PRN for sleep in addition to scheduled trazodone 100 mg nightly.  Armando Reichert, MD PGY 2 03/18/2021, 4:26 PM

## 2021-03-18 NOTE — B[facility] Counselor (Signed)
CSW spoke with patient regarding discharge planning.  Patient aware that he has appointments set up with [facility] but that he will probably need to go to walk in hours before his appointments to get a refill in medications.  Patient was hoping to have a timeline for when he would be released.  CSW encouraged him to speak with doctor.  ? ? ?Leanndra Pember, LCSW, LCAS ?Clincal Social Worker  ?[facility] ? ?

## 2021-03-19 ENCOUNTER — Inpatient Hospital Stay (HOSPITAL_COMMUNITY): Payer: Medicaid Other

## 2021-03-19 ENCOUNTER — Encounter (HOSPITAL_COMMUNITY): Payer: Self-pay | Admitting: Psychiatry

## 2021-03-19 ENCOUNTER — Other Ambulatory Visit: Payer: Self-pay

## 2021-03-19 LAB — CBC WITH DIFFERENTIAL/PLATELET
Abs Immature Granulocytes: 0.03 10*3/uL (ref 0.00–0.07)
Basophils Absolute: 0 10*3/uL (ref 0.0–0.1)
Basophils Relative: 0 %
Eosinophils Absolute: 0.1 10*3/uL (ref 0.0–0.5)
Eosinophils Relative: 1 %
HCT: 43.6 % (ref 39.0–52.0)
Hemoglobin: 14.6 g/dL (ref 13.0–17.0)
Immature Granulocytes: 0 %
Lymphocytes Relative: 11 %
Lymphs Abs: 1 10*3/uL (ref 0.7–4.0)
MCH: 30.5 pg (ref 26.0–34.0)
MCHC: 33.5 g/dL (ref 30.0–36.0)
MCV: 91 fL (ref 80.0–100.0)
Monocytes Absolute: 0.5 10*3/uL (ref 0.1–1.0)
Monocytes Relative: 6 %
Neutro Abs: 7.6 10*3/uL (ref 1.7–7.7)
Neutrophils Relative %: 82 %
Platelets: 174 10*3/uL (ref 150–400)
RBC: 4.79 MIL/uL (ref 4.22–5.81)
RDW: 13.2 % (ref 11.5–15.5)
WBC: 9.3 10*3/uL (ref 4.0–10.5)
nRBC: 0 % (ref 0.0–0.2)

## 2021-03-19 LAB — COMPREHENSIVE METABOLIC PANEL
ALT: 79 U/L — ABNORMAL HIGH (ref 0–44)
AST: 45 U/L — ABNORMAL HIGH (ref 15–41)
Albumin: 4 g/dL (ref 3.5–5.0)
Alkaline Phosphatase: 78 U/L (ref 38–126)
Anion gap: 9 (ref 5–15)
BUN: 15 mg/dL (ref 6–20)
CO2: 24 mmol/L (ref 22–32)
Calcium: 9.2 mg/dL (ref 8.9–10.3)
Chloride: 104 mmol/L (ref 98–111)
Creatinine, Ser: 1.12 mg/dL (ref 0.61–1.24)
GFR, Estimated: >60 mL/min (ref >60)
Glucose, Bld: 103 mg/dL — ABNORMAL HIGH (ref 70–99)
Potassium: 4.4 mmol/L (ref 3.5–5.1)
Sodium: 137 mmol/L (ref 135–145)
Total Bilirubin: 0.4 mg/dL (ref 0.3–1.2)
Total Protein: 7.3 g/dL (ref 6.5–8.1)

## 2021-03-19 LAB — URINALYSIS, ROUTINE W REFLEX MICROSCOPIC
Bilirubin Urine: NEGATIVE
Glucose, UA: NEGATIVE mg/dL
Hgb urine dipstick: NEGATIVE
Ketones, ur: NEGATIVE mg/dL
Leukocytes,Ua: NEGATIVE
Nitrite: NEGATIVE
Protein, ur: NEGATIVE mg/dL
Specific Gravity, Urine: 1.016 (ref 1.005–1.030)
pH: 5 (ref 5.0–8.0)

## 2021-03-19 LAB — LIPASE, BLOOD: Lipase: 41 U/L (ref 11–51)

## 2021-03-19 LAB — HEPATITIS PANEL, ACUTE
HCV Ab: NONREACTIVE
Hep A IgM: NONREACTIVE
Hep B C IgM: NONREACTIVE
Hepatitis B Surface Ag: REACTIVE — AB

## 2021-03-19 MED ORDER — LOPERAMIDE HCL 2 MG PO CAPS
2.0000 mg | ORAL_CAPSULE | Freq: Once | ORAL | Status: AC
  Administered 2021-03-20: 2 mg via ORAL
  Filled 2021-03-19 (×2): qty 1

## 2021-03-19 MED ORDER — ONDANSETRON 4 MG PO TBDP
4.0000 mg | ORAL_TABLET | Freq: Four times a day (QID) | ORAL | Status: DC | PRN
  Administered 2021-03-19 – 2021-03-21 (×2): 4 mg via ORAL
  Filled 2021-03-19 (×3): qty 1

## 2021-03-19 MED ORDER — IOHEXOL 300 MG/ML  SOLN
100.0000 mL | Freq: Once | INTRAMUSCULAR | Status: AC | PRN
  Administered 2021-03-19: 100 mL via INTRAVENOUS

## 2021-03-19 NOTE — Progress Notes (Signed)
?   03/19/21 1950  ?Psych Admission Type (Psych Patients Only)  ?Admission Status Voluntary  ?Psychosocial Assessment  ?Patient Complaints Irritability;Other (Comment) ?(nausea, vomiting, diarrhea)  ?Eye Contact Fair  ?Facial Expression Animated  ?Affect Irritable  ?Speech Logical/coherent  ?Interaction Assertive  ?Motor Activity Other (Comment) ?(wnl)  ?Appearance/Hygiene Unremarkable  ?Behavior Characteristics Cooperative;Irritable  ?Mood Irritable  ?Thought Process  ?Coherency Circumstantial  ?Content Blaming others  ?Delusions None reported or observed  ?Perception Hallucinations  ?Hallucination Auditory  ?Judgment Poor  ?Confusion None  ?Danger to Self  ?Current suicidal ideation? Denies  ?Danger to Others  ?Danger to Others None reported or observed  ? ?Pt seen in his room. Pt laying in bed irritable because he has vomited and said no nurse came to check on him. Explained that orders for medication were being obtained for him. Pt denies SI, HI, VH. Pt endorses AH still being present. Pt rates chronic foot pain (after surgery) in right foot 10/10. Pt denies anxiety and depression. Pt believes he ate something for one of the meals today that is affecting his digestive system.  ?

## 2021-03-19 NOTE — ED Triage Notes (Signed)
Pt brought over from [facility]. Pt reports generalized abdominal pain and nausea since this morning. Pt denies V/D, blood in stool, and urinary problems.  ?

## 2021-03-19 NOTE — Discharge Summary (Signed)
Physician Discharge Summary Note  Patient:  Dylan [name] is an 56 y.o., male MRN:  [id number] DOB:  Jun 04, 1965 Patient phone:  [phone] (home)  Patient address:   [address] [id number],  Total Time spent with patient: 15 minutes  Date of Admission:  03/15/2021 Date of Discharge: 03/19/2021 (sending to ER day)  Reason for Admission:   History of Present Illness: Patient is a 56 year old male with past psychiatric history of schizoaffective disorder bipolar type, GAD, alcohol abuse, tobacco abuse, cocaine abuse and past medical history of HIV, hep B, degenerative disc disease, history of rhabdomyolysis, prediabetes and liver lesion who initially presented to [facility] on 03/13/2021 with suicidal ideations with a plan to shoot himself with a double barrel shotgun and auditory hallucinations.  Patient was assessed by psychiatry and observed for 2 days at [facility] observation unit and then transferred to [facility] inpatient adult unit on 03/15/2021.   Principal Problem: Schizoaffective disorder, bipolar type [facility]) Discharge Diagnoses: Principal Problem:   Schizoaffective disorder, bipolar type ([place]) Active Problems:   Tobacco abuse   Generalized anxiety disorder   Alcohol abuse   Vitamin D deficiency   Cocaine abuse ([place])   Past Psychiatric History:  Previous Psych Diagnoses: Schizoaffective disorder bipolar type Prior inpatient treatment: Multiple previous psychiatric admissions with last admission at [facility] on 04/28/2020 where he was admitted for suicidal thoughts and was discharged on gabapentin 600 mg daily and 900 mg nightly, Seroquel 200 mg nightly, trazodone 150 mg nightly.  His home Cymbalta was held during last hospitalization due to high liver enzymes. Current/prior outpatient treatment: Gabapentin 600 mg daily and 900 mg nightly, Seroquel 200 mg nightly, trazodone 150 mg nightly.  His home Cymbalta 30 mg was held during last hospitalization due to high liver  enzymes. Prior rehab hx: He was at [facility] x 2 months before this hospitalization Psychotherapy hx: Received few months of psychotherapy at [facility]. History of suicide: Multiple suicidal attempts with last suicide attempt last year.  Reports suicidal attempts with overdose and 1 time he went to the top of apartment building. History of homicide: None Psychiatric medication compliance history: He has been off medication for last 2 weeks due to change in insurance and not able to fill his medication. Neuromodulation history: None Current Psychiatrist: Last refill received from [facility].  Few refills received from [facility]. Current therapist: None  Past Medical History:  Past Medical History:  Diagnosis Date   Anxiety    Arthritis    Bipolar 1 disorder ([place])    Colon polyps    Depression    GERD (gastroesophageal reflux disease)    Hepatitis B    Human immunodeficiency virus (HIV) ([place])    Hyperlipidemia    Hypertension    Neuromuscular disorder ([place])    neuropathy   Neuropathy    Pre-diabetes    Schizophrenia ([place])     Past Surgical History:  Procedure Laterality Date   FOOT ARTHRODESIS Right 09/17/2019   Procedure: FUSION RIGHT LISFRANC JOINT;  Surgeon: Newt Minion, MD;  Location: [facility];  Service: Orthopedics;  Laterality: Right;   FOOT ARTHRODESIS Right 09/2019   HEMORROIDECTOMY     IR FLUORO GUIDE CV LINE RIGHT  02/22/2017   IR US GUIDE VASC ACCESS RIGHT  02/22/2017   TUMOR REMOVAL     From Chest   Family History:  Family History  Problem Relation Age of Onset   CAD Mother    Diabetes Father  Colon cancer Maternal Aunt 60   Diabetes Maternal Aunt    Heart disease Maternal Uncle    Esophageal cancer Neg Hx    Rectal cancer Neg Hx    Stomach cancer Neg Hx    Family Psychiatric  History:  Medical: Father died in 04/18/00 due to stroke Psych: Mom has mental issues -but he does not know diagnosis.  Uncle has?  Schizophrenia.   No suicidal attempt in the  family Substance use family hx: Unknown Social History:  Social History   Substance and Sexual Activity  Alcohol Use Yes   Comment: occassional drinker     Social History   Substance and Sexual Activity  Drug Use Yes   Types: Cocaine   Comment: last use was Friday 03/05/2020    Social History   Socioeconomic History   Marital status: Married    Spouse name: Not on file   Number of children: 1   Years of education: Not on file   Highest education level: Not on file  Occupational History   Not on file  Tobacco Use   Smoking status: Every Day    Packs/day: 0.50    Years: 25.00    Pack years: 12.50    Types: E-cigarettes, Cigarettes   Smokeless tobacco: Never   Tobacco comments:    has patches when he is ready to quit  Vaping Use   Vaping Use: Every day   Start date: 12/09/2020   Substances: Nicotine  Substance and Sexual Activity   Alcohol use: Yes    Comment: occassional drinker   Drug use: Yes    Types: Cocaine    Comment: last use was Friday 03/05/2020   Sexual activity: Yes    Partners: Female    Comment: condoms given  Other Topics Concern   Not on file  Social History Narrative   Not on file   Social Determinants of Health   Financial Resource Strain: Not on file  Food Insecurity: Not on file  Transportation Needs: Not on file  Physical Activity: Not on file  Stress: Not on file  Social Connections: Not on file    Hospital Course:   During the course of patient's hospitalization, the 15-minute checks were adequate to ensure patient's safety. Patient did not exhibit erratic or aggressive behavior and was compliant with scheduled medication and did not require seclusion or restraint, attended groups and was social with peers and staff. He was on the 300 general population hall. He was medication compliant and appropriate in interviews.    Today upon evaluation, the patient gives a mood of "hurting" likely due to acute stomach pain. Patient requesting to  go to ER for acute stomach pain. He has history of gall stones, hepatitis B, states this pain is atypical. He attempted to eat at breakfast. Got a bite or two of cereal and was unable to tolerate and returned to unit with severe pain. He allowed exam of abdomen. No SI/HI/AH/VH.   Belly was soft, but guarding present. No rebound. Pain per-umbilical. No masses. No apparent organomegaly, but exam limited by pain. No apparent cyanosis, sweating.    Physical Findings: AIMS: Facial and Oral Movements Muscles of Facial Expression: None, normal Lips and Perioral Area: None, normal Jaw: None, normal Tongue: None, normal,Extremity Movements Upper (arms, wrists, hands, fingers): None, normal Lower (legs, knees, ankles, toes): None, normal, Trunk Movements Neck, shoulders, hips: None, normal, Overall Severity Severity of abnormal movements (highest score from questions above): None, normal Incapacitation due to  abnormal movements: None, normal Patient's awareness of abnormal movements (rate only patient's report): No Awareness, Dental Status Current problems with teeth and/or dentures?: No Does patient usually wear dentures?: No  CIWA:    COWS:     Musculoskeletal: Strength & Muscle Tone: within normal limits Gait & Station:  not observed Patient leans:  lying uncomfortable   Psychiatric Specialty Exam:  Presentation  General Appearance: Casual  Eye Contact:Fair  Speech:Clear and Coherent  Speech Volume:Normal  Handedness:Right   Mood and Affect  Mood:Dysphoric; Anxious  Affect:Congruent; Constricted   Thought Process  Thought Processes:Coherent  Descriptions of Associations:Intact  Orientation:Full (Time, Place and Person)  Thought Content:Logical  History of Schizophrenia/Schizoaffective disorder:Yes  Duration of Psychotic Symptoms:Greater than six months  Hallucinations:Hallucinations: None Description of Auditory Hallucinations: None  Ideas of  Reference:None  Suicidal Thoughts:Suicidal Thoughts: No  Homicidal Thoughts:Homicidal Thoughts: No   Sensorium  Memory:Immediate Good; Remote Good  Judgment:Poor  Insight:Fair   Executive Functions  Concentration:Fair  Attention Span:Good  [place] of Knowledge:Good  Language:Good   Psychomotor Activity  Psychomotor Activity:Psychomotor Activity: Normal   Assets  Assets:Communication Skills; Desire for Improvement; Financial Resources/Insurance; Physical Health; Resilience   Sleep  Sleep:Sleep: Good    Physical Exam: Physical Exam Vitals and nursing note reviewed.  Constitutional:      General: He is in acute distress.     Appearance: He is ill-appearing.  HENT:     Head: Normocephalic.  Eyes:     Extraocular Movements: Extraocular movements intact.  Cardiovascular:     Rate and Rhythm: Normal rate.  Abdominal:     General: There is no distension.     Palpations: Abdomen is soft. There is no mass.     Tenderness: There is abdominal tenderness. There is guarding. There is no rebound.  Musculoskeletal:        General: Normal range of motion.     Cervical back: Normal range of motion.  Skin:    General: Skin is warm.  Neurological:     General: No focal deficit present.     Mental Status: He is alert and oriented to person, place, and time.   Review of Systems  Constitutional:  Negative for chills and fever.  HENT:  Negative for hearing loss.   Eyes:  Negative for blurred vision.  Respiratory:  Negative for cough.   Cardiovascular:  Negative for chest pain.  Gastrointestinal:  Positive for abdominal pain and nausea. Negative for vomiting.       Peri-umbilical pain  Skin:  Negative for rash.  Psychiatric/Behavioral:  Negative for hallucinations and suicidal ideas.   Blood pressure (!) 141/77, pulse 78, temperature 97.7 F (36.5 C), temperature source Oral, resp. rate 18, height 6\' 2"  (1.88 m), weight 105.2 kg, SpO2 96 %. Body mass index  is 29.79 kg/m.   Social History   Tobacco Use  Smoking Status Every Day   Packs/day: 0.50   Years: 25.00   Pack years: 12.50   Types: E-cigarettes, Cigarettes  Smokeless Tobacco Never  Tobacco Comments   has patches when he is ready to quit   Tobacco Cessation:  A prescription for an FDA-approved tobacco cessation medication provided at discharge   Blood Alcohol level:  Lab Results  Component Value Date   [facility] <10 03/13/2021   ETH <10 84/16/6063    Metabolic Disorder Labs:  Lab Results  Component Value Date   HGBA1C 5.7 (H) 03/13/2021   MPG 116.89 03/13/2021   MPG 125.5 10/26/2020  Lab Results  Component Value Date   PROLACTIN 12.1 03/08/2020   PROLACTIN 2.6 (L) 11/09/2019   Lab Results  Component Value Date   CHOL 189 03/17/2021   TRIG 349 (H) 03/17/2021   HDL 44 03/17/2021   CHOLHDL 4.3 03/17/2021   VLDL 70 (H) 03/17/2021   LDLCALC 75 03/17/2021   LDLCALC 128 (H) 03/13/2021    See Psychiatric Specialty Exam and Suicide Risk Assessment completed by Attending Physician prior to discharge.  Discharge destination:  Other:  to ER  Is patient on multiple antipsychotic therapies at discharge:  No        Follow-up Information     [facility] Follow up.   Specialty: Hospice and Palliative Medicine Why: Please call personally to schedule an appointment with this provider for grief/bereavement therapy services. Contact information: [address] [place]. Go on 04/28/2021.   Specialty: Behavioral Health Why: You have an appointment for medication management services on 04/28/21 at 2:30 pm. You also have an appointment for therapy on 05/17/21 at 1:00 pm; (These appointments will be held in person).  * If you need to be seen sooner, please go to this provider during walk in hours:  Monday through Wednesday, from 7:30 am to 10:30 am. Contact information: [address] [place]                Follow-up recommendations:  Activity:  per ER   Signed: Maida Sale, MD 03/19/2021, 7:49 AM

## 2021-03-19 NOTE — ED Notes (Signed)
Pt given urinal and informed of need for urine sample  

## 2021-03-19 NOTE — Progress Notes (Addendum)
D: Patient sent to the [facility] today for severe abdominal pain. Patient had a CT scan of abdomen completed. He denies any thoughts of self harm; he denies AVH. Patient states he feels better now and is currently eating his lunch. He does not appear to be responding to internal stimuli. Patient is pleasant with staff and peers.  ? ?A: Continue to monitor medication management and MD orders.  Safety checks completed every 15 minutes per protocol.  Offer support and encouragement as needed. ? ?R: Patient is receptive to staff; his behavior is appropriate.   ? ? 03/19/21 0900  ?Psych Admission Type (Psych Patients Only)  ?Admission Status Voluntary  ?Psychosocial Assessment  ?Patient Complaints Anxiety  ?Eye Contact Fair  ?Facial Expression Animated  ?Affect Apprehensive  ?Speech Logical/coherent  ?Interaction Assertive  ?Motor Activity Slow  ?Appearance/Hygiene Unremarkable  ?Behavior Characteristics Cooperative  ?Mood Sullen  ?Thought Process  ?Coherency WDL  ?Content WDL  ?Delusions None reported or observed  ?Perception WDL  ?Hallucination None reported or observed  ?Judgment Limited  ?Confusion None  ?Danger to Self  ?Current suicidal ideation? Denies  ?Danger to Others  ?Danger to Others None reported or observed  ? ? ?

## 2021-03-19 NOTE — Progress Notes (Signed)
Rounded on pt to see how his nausea was progressing. Pt says nausea is decreasing. Pt says he has had one bout of diarrhea as well. VS assessed. Provider notified. Pt told to let this writer know if there are any other incidences of diarrhea this evening. Pt verbalized understanding. ?

## 2021-03-19 NOTE — Progress Notes (Addendum)
Pt using call bell from room at 1930. Pt states he has been vomiting in the trash can. Provider still here so asked for medication for nausea. Given verbal order for Zofran PRN. Pt irritable. "I hit that bell 3 times and no one has come to my room. I don't want nothing else from [facility]. I'm gonna tell everyone about them." Pt upset and using expletives. Explained to pt that this Probation officer was speaking with provider and obtaining medication for him.  ? ?Update: Pt vomited again. Believes it was something he ate. "I shouldn't have eaten that dinner." Pt given Gatorade and is laying down. ?

## 2021-03-19 NOTE — Progress Notes (Signed)
Patient ID: Dylan [name], male   DOB: 1965-08-23, 56 y.o.   MRN: [id number] ?Patient is on the way back from [facility]. He is afebrile and denies any chills, chest pain, SOB or headache. Patient has a hx of gallstones; however, no obstruction noted in the [facility]. He denies any thoughts of self harm. He denies AVH. Patient's pain was subsiding by the time he left for the ED.  ?

## 2021-03-19 NOTE — ED Notes (Signed)
Pt is unable to provide a urine sample at  this time, pt started that he try but was unsuccessful.  ?

## 2021-03-19 NOTE — ED Notes (Addendum)
Safe transport called to take patient and sitter back to [facility]. ?

## 2021-03-19 NOTE — Progress Notes (Signed)
Psychoeducational Group Note ? ?Date:  03/19/2021 ?Time:  2046 ? ?Group Topic/Focus:  ?Wrap-Up Group:   The focus of this group is to help patients review their daily goal of treatment and discuss progress on daily workbooks. ? ?Participation Level: Did Not Attend ? ?Participation Quality:  Not Applicable ? ?Affect:  Not Applicable ? ?Cognitive:  Not Applicable ? ?Insight:  Not Applicable ? ?Engagement in Group: Not Applicable ? ?Additional Comments:  The patient did not attend group this evening.  ? ?Archie Balboa S ?03/19/2021, 8:45 PM  ?

## 2021-03-19 NOTE — [facility] Group Notes (Signed)
Psychoeducational Group Note ? ? ? ?Date:03/19/21 ?Time: 1300-1400 ? ? ? ?Purpose of Group: . The group focus' on teaching patients on how to identify their needs and their Life Skills:  A group where two lists are made. What people need and what are things that we do that are unhealthy. The lists are developed by the patients and it is explained that we often do the actions that are not healthy to get our list of needs met. ? ?Goal:: to develop the coping skills needed to get their needs met ? ?Participation Level:  Active ? ?Participation Quality:  Appropriate ? ?Affect:  Appropriate ? ?Cognitive:  Oriented ? ?Insight:  Improving ? ?Engagement in Group:  Engaged ? ?Additional Comments: Rates energy at an 8/10. Participated in the group and added to the list that was gathered ? ?Dylan [name] A ? ?

## 2021-03-19 NOTE — Discharge Instructions (Addendum)
Your labs today were overall reassuring.  Your CT does show that you have some gall stones however your gallbladder itself is not infected nor does appear obstructed.  Fortunately, your pain resolved with a GI cocktail and with some time.  If you develop pain again that is quite severe or does not improve after about 5 hours, please return again to the ED. ?

## 2021-03-19 NOTE — Progress Notes (Signed)
Patient ID: Dylan [name], male   DOB: [birth year], 56 y.o.   MRN: [id number] ?Patient hit the call bell 3x this morning. Every time I went to his room to see what he wanted, he described severe abdominal pain. He states, "I have gall stones. I think I ate too much chocolate yesterday." He states he ate "a little bit" of breakfast. I explained that MD will need to assess him before he is sent to the ED and the only person that can send him is the provider. Patient was irritable and adamant that he "needs to go to the ED." Patient was transported by [facility] with [facility] accompanying him at 0815. Medications were held due to nausea and severe abdominal pain. Charge RN Precious Bard was notified at [facility] before transport. ?

## 2021-03-19 NOTE — ED Provider Notes (Signed)
[facility] DEPT Provider Note   CSN: [id number] Arrival date & time: 03/19/21  2778      Chief Complaint  Patient presents with   bipolar   Abdominal Pain    Dylan [name] is a 56 y.o. male who presents here from the [facility] for evaluation of generalized abdominal pain that started suddenly this morning.  He also has significant nausea although he denies vomiting and diarrhea.  Also denies urinary symptoms.  Patient states he has a history of gallstones and believes that this may be the cause.  Pain is starting to subside by the time he was evaluated here in the emergency department.  He denies fever, chills, chest pain, shortness of breath and headache.   Abdominal Pain     Home Medications Prior to Admission medications   Medication Sig Start Date End Date Taking? Authorizing Provider  bictegravir-emtricitabine-tenofovir AF (BIKTARVY) 50-200-25 MG TABS tablet Take 1 tablet by mouth daily. 03/16/21   Revonda Humphrey, NP  DULoxetine (CYMBALTA) 30 MG capsule Take 1 capsule (30 mg total) by mouth daily. 03/16/21   Revonda Humphrey, NP  gabapentin (NEURONTIN) 300 MG capsule Take 1 capsule (300 mg total) by mouth 2 (two) times daily. 03/15/21   Revonda Humphrey, NP  hydrOXYzine (ATARAX) 25 MG tablet Take 1 tablet (25 mg total) by mouth 3 (three) times daily as needed for anxiety. 01/17/21   Mayers, Cari S, PA-C  QUEtiapine (SEROQUEL) 100 MG tablet Take 1 tablet (100 mg total) by mouth at bedtime. 03/15/21   Revonda Humphrey, NP  rosuvastatin (CRESTOR) 20 MG tablet Take 1 tablet (20 mg total) by mouth daily. 01/11/21   Ival Bible, MD  traZODone (DESYREL) 50 MG tablet Take 1 tablet (50 mg total) by mouth at bedtime as needed for sleep. 03/15/21   Revonda Humphrey, NP      Allergies    Penicillins, Latex, and Tape    Review of Systems   Review of Systems  Gastrointestinal:  Positive for abdominal pain.   Physical Exam Updated Vital Signs BP (!)  135/92    Pulse 89    Temp 97.6 F (36.4 C) (Oral)    Resp 18    Ht 6\' 2"  (1.88 m)    Wt 105.2 kg    SpO2 97%    BMI 29.79 kg/m  Physical Exam Vitals and nursing note reviewed.  Constitutional:      General: He is not in acute distress.    Appearance: He is not ill-appearing.  HENT:     Head: Atraumatic.  Eyes:     Conjunctiva/sclera: Conjunctivae normal.  Cardiovascular:     Rate and Rhythm: Normal rate and regular rhythm.     Pulses: Normal pulses.     Heart sounds: No murmur heard. Pulmonary:     Effort: Pulmonary effort is normal. No respiratory distress.     Breath sounds: Normal breath sounds.  Abdominal:     General: Abdomen is flat. There is distension.     Palpations: Abdomen is soft.     Comments: Abdomen feels mildly distended without evidence of fluid overload.  Negative RUQ tenderness, negative Murphy sign.  Reports that pain feels slightly better after palpation around the umbilical region.  Musculoskeletal:        General: Normal range of motion.     Cervical back: Normal range of motion.  Skin:    General: Skin is warm and dry.     Capillary  Refill: Capillary refill takes less than 2 seconds.  Neurological:     General: No focal deficit present.     Mental Status: He is alert.  Psychiatric:        Mood and Affect: Mood normal.    ED Results / Procedures / Treatments   Labs (all labs ordered are listed, but only abnormal results are displayed) Labs Reviewed  HEPATIC FUNCTION PANEL - Abnormal; Notable for the following components:      Result Value   AST 81 (*)    ALT 104 (*)    Indirect Bilirubin 0.2 (*)    All other components within normal limits  HEPATITIS PANEL, ACUTE - Abnormal; Notable for the following components:   Hepatitis B Surface Ag Reactive (*)    All other components within normal limits  LIPASE, BLOOD - Abnormal; Notable for the following components:   Lipase 62 (*)    All other components within normal limits  LIPID PANEL - Abnormal;  Notable for the following components:   Triglycerides 349 (*)    VLDL 70 (*)    All other components within normal limits  COMPREHENSIVE METABOLIC PANEL - Abnormal; Notable for the following components:   Glucose, Bld 103 (*)    AST 45 (*)    ALT 79 (*)    All other components within normal limits  RPR  VITAMIN B12  CBC WITH DIFFERENTIAL/PLATELET  CBC WITH DIFFERENTIAL/PLATELET  URINALYSIS, ROUTINE W REFLEX MICROSCOPIC  LIPASE, BLOOD    EKG None  Radiology CT ABDOMEN PELVIS W CONTRAST  Result Date: 03/19/2021 CLINICAL DATA:  Upper abdominal pain. EXAM: CT ABDOMEN AND PELVIS WITH CONTRAST TECHNIQUE: Multidetector CT imaging of the abdomen and pelvis was performed using the standard protocol following bolus administration of intravenous contrast. RADIATION DOSE REDUCTION: This exam was performed according to the departmental dose-optimization program which includes automated exposure control, adjustment of the mA and/or kV according to patient size and/or use of iterative reconstruction technique. CONTRAST:  170mL OMNIPAQUE IOHEXOL 300 MG/ML  SOLN COMPARISON:  MRI abdomen 02/26/2021.  CT abdomen 02/01/2021. FINDINGS: Lower chest: Clear lung bases. Hepatobiliary: No focal liver abnormality is seen. Unchanged tiny stone in the gallbladder. No biliary dilatation. Pancreas: Unremarkable. Spleen: Unremarkable. Adrenals/Urinary Tract: Unremarkable adrenal glands. No renal calculi or hydronephrosis. Unchanged subcentimeter hypodensity in the right kidney compatible with a cyst. Unremarkable bladder. Stomach/Bowel: The stomach is moderately distended by fluid. There is no evidence of bowel obstruction or inflammation. The appendix is unremarkable. Vascular/Lymphatic: Normal caliber of the abdominal aorta. Unchanged 2.3 x 1.4 cm focus of soft tissue anterior to the caudate lobe of the liver characterized as a lymph node on MRI. No enlarged or suspicious lymph nodes elsewhere in the abdomen or pelvis.  Reproductive: Unremarkable prostate. Other: No ascites or pneumoperitoneum. Musculoskeletal: Disc degeneration at L4-5 and L5-S1. IMPRESSION: 1. No acute abnormality identified in the abdomen or pelvis. 2. Cholelithiasis. 3. Unchanged mildly enlarged periportal lymph node. Electronically Signed   By: Logan Bores M.D.   On: 03/19/2021 11:57    Procedures Procedures    Medications Ordered in ED Medications  alum & mag hydroxide-simeth (MAALOX/MYLANTA) 200-200-20 MG/5ML suspension 30 mL (30 mLs Oral Given 03/16/21 1717)  magnesium hydroxide (MILK OF MAGNESIA) suspension 30 mL (has no administration in time range)  bictegravir-emtricitabine-tenofovir AF (BIKTARVY) 50-200-25 MG per tablet 1 tablet (0 tablets Oral Hold 03/19/21 0804)  hydrOXYzine (ATARAX) tablet 25 mg (25 mg Oral Given 03/17/21 1823)  traZODone (DESYREL) tablet 50 mg (50  mg Oral Given 03/16/21 2133)  OLANZapine zydis (ZYPREXA) disintegrating tablet 10 mg (has no administration in time range)    And  LORazepam (ATIVAN) tablet 1 mg (has no administration in time range)    And  ziprasidone (GEODON) injection 20 mg (has no administration in time range)  ibuprofen (ADVIL) tablet 400 mg (400 mg Oral Given 03/18/21 0907)  nicotine (NICODERM CQ - dosed in mg/24 hours) patch 14 mg (0 mg Transdermal Hold 03/19/21 0805)  traZODone (DESYREL) tablet 100 mg (100 mg Oral Given 03/18/21 2143)  gabapentin (NEURONTIN) capsule 300 mg (0 mg Oral Hold 03/19/21 1104)  QUEtiapine (SEROQUEL) tablet 200 mg (200 mg Oral Given 03/18/21 2143)  QUEtiapine (SEROQUEL XR) 24 hr tablet 50 mg (0 mg Oral Hold 03/19/21 0806)  prazosin (MINIPRESS) capsule 1 mg (1 mg Oral Given 03/18/21 2143)  iohexol (OMNIPAQUE) 300 MG/ML solution 100 mL (100 mLs Intravenous Contrast Given 03/19/21 1118)    ED Course/ Medical Decision Making/ A&P Clinical Course as of 03/19/21 1214  Sat Mar 19, 2021  0930 Hepatitis panel, acute(!) [EC]    Clinical Course User Index [EC] Tonye Pearson,  PA-C                           Medical Decision Making Amount and/or Complexity of Data Reviewed Labs: ordered. Decision-making details documented in ED Course. Radiology: ordered.  Risk Prescription drug management.   History:  Per HPI Social determinants of health: History of bipolar currently admitted to [facility]  Initial impression:  This patient presents to the ED for concern of abdominal pain, this involves an extensive number of treatment options, and is a complaint that carries with it a high risk of complications and morbidity.   The differential diagnosis for generalized abdominal pain includes, but is not limited to AAA, gastroenteritis, appendicitis, Bowel obstruction, Bowel perforation. Gastroparesis, DKA, Hernia, Inflammatory bowel disease, mesenteric ischemia, pancreatitis, peritonitis SBP, volvulus. 56 year old male in no acute distress, nontoxic-appearing.  Abdomen appears slightly distended but he is without right upper quadrant tenderness.  He does have some pain of the umbilical region that is slightly alleviated upon palpation.  Will obtain labs and CT abdomen.   Lab Tests and EKG:  I Ordered, reviewed, and interpreted labs and EKG.  The pertinent results include:  CBC, UA, lipase and CMP unremarkable   Imaging Studies ordered:  I ordered imaging studies including  CT abdomen with cholelithiasis without cholecystitis, no acute cause of abdominal pain I independently visualized and interpreted imaging and I agree with the radiologist interpretation.    Disposition:  After consideration of the diagnostic results, physical exam, history and the patients response to treatment feel that the patent would benefit from discharge.   Cholelithiasis without cholecystitis: Patient symptoms resolved spontaneously over time while here in the ED.  CT scan and labs were reassuring.  Given that his pain is resolved and his normal work-up, no additional diagnostics are required  at this time.  Patient is stable to discharge back to [facility].  All questions were asked and answered.  He was in stable condition at discharge.  } Final Clinical Impression(s) / ED Diagnoses Final diagnoses:  Calculus of gallbladder without cholecystitis without obstruction    Rx / DC Orders ED Discharge Orders     None         Tonye Pearson, PA-C 03/19/21 1217    Lorelle Gibbs, DO 03/19/21 1540

## 2021-03-20 DIAGNOSIS — F250 Schizoaffective disorder, bipolar type: Principal | ICD-10-CM

## 2021-03-20 MED ORDER — LOPERAMIDE HCL 2 MG PO CAPS
2.0000 mg | ORAL_CAPSULE | Freq: Once | ORAL | Status: AC
  Administered 2021-03-20: 2 mg via ORAL
  Filled 2021-03-20 (×2): qty 1

## 2021-03-20 MED ORDER — LOPERAMIDE HCL 2 MG PO CAPS
4.0000 mg | ORAL_CAPSULE | Freq: Four times a day (QID) | ORAL | Status: DC | PRN
  Administered 2021-03-20: 4 mg via ORAL
  Filled 2021-03-20: qty 2

## 2021-03-20 MED ORDER — DICYCLOMINE HCL 20 MG PO TABS
20.0000 mg | ORAL_TABLET | Freq: Once | ORAL | Status: AC | PRN
  Administered 2021-03-21: 20 mg via ORAL
  Filled 2021-03-20: qty 1

## 2021-03-20 NOTE — [facility] Group Notes (Signed)
LCSW Group Therapy Note ? ?No social work group was held today due to a scheduling conflict.  Another licensed group was held by an Therapist, sports. ? ?Selmer Dominion, LCSW ?10/30/2020 ?1:52 PM  ?  ? ?

## 2021-03-20 NOTE — Progress Notes (Signed)
Pt again vomiting. Pt has been drinking Gatorade to stay hydrated (about 72 oz). Provider notified. Will defer to re-evaluation from day shift provider. Pt VSS. Pt assisted to clean up and is laying down. Will continue to monitor. ?

## 2021-03-20 NOTE — Progress Notes (Signed)
Patient rated his day as a 6 or 7 since he is feeling less sick to his stomach. He did not share a goal for tomorrow.  ?

## 2021-03-20 NOTE — [facility] Group Notes (Signed)
Adult Psychoeducational Group Not ?Date:  03/20/2021 ?Time:  9509-3267 ?Group Topic/Focus: PROGRESSIVE RELAXATION. A group where deep breathing is taught and tensing and relaxation muscle groups is used. Imagery is used as well.  Pts are asked to imagine 3 pillars that hold them up when they are not able to hold themselves up and to share that with the group. ? ?Participation Level:  Active ? ?Participation Quality:  Appropriate ? ?Affect:  Appropriate ? ?Cognitive:  Oriented ? ?Insight: Improving ? ?Engagement in Group:  Engaged ? ?Modes of Intervention:  Activity, Discussion, Education, and Support ? ?Additional Comments:  was not able to give an energy level. Pt only stated faith as something that holds him up. ? ?Dylan [name] A ? ?

## 2021-03-20 NOTE — Progress Notes (Addendum)
D: Patient c/o nausea and bouts of diarrhea. Patient was given 2nd dose of immodium this morning; held scheduled medications due to the nausea. Patient states that he wants to be "transferred out" of [facility] system. Patient was transferred to the ED yesterday for a workup for severe abdominal pain. He was sent back around 1200 and patient ate lunch and dinner, even though he states he has no appetite. He is irritable with staff; demanding "broth and jello" (both of which we do not stock). Nursing staff unaware of what his housing situation is; he may be offered to discharge [facility] if he adamant about leaving this facility. Patient is currently in relaxation group. Patient ambulated to dayroom without rolling walker. He denies any thoughts of self harm; he does not appear to be responding to internal stimuli. ? ?A: Continue to monitor medication management and MD orders.  Safety checks completed every 15 minutes per protocol.  Offer support and encouragement as needed. ? ?R: Patient is irritable with staff; he is demanding to leave. ? ? 03/20/21 0800  ?Psych Admission Type (Psych Patients Only)  ?Admission Status Voluntary  ?Psychosocial Assessment  ?Patient Complaints Irritability  ?Eye Contact Avoids  ?Facial Expression Angry  ?Affect Irritable  ?Speech Logical/coherent  ?Interaction Demanding  ?Motor Activity Other (Comment) ?(in bed)  ?Appearance/Hygiene Unremarkable  ?Behavior Characteristics Irritable  ?Mood Irritable  ?Thought Process  ?Coherency Circumstantial  ?Content Blaming others  ?Delusions None reported or observed  ?Perception WDL  ?Hallucination None reported or observed  ?Judgment Poor  ?Confusion None  ?Danger to Self  ?Current suicidal ideation? Denies  ?Danger to Others  ?Danger to Others None reported or observed  ? ? ?

## 2021-03-20 NOTE — Progress Notes (Signed)
?   03/20/21 2132  ?Psych Admission Type (Psych Patients Only)  ?Admission Status Voluntary  ?Psychosocial Assessment  ?Patient Complaints None  ?Eye Contact Brief;Fair  ?Facial Expression Animated  ?Affect Appropriate to circumstance  ?Speech Logical/coherent  ?Interaction Assertive  ?Motor Activity Other (Comment) ?(WDL)  ?Appearance/Hygiene Unremarkable  ?Behavior Characteristics Cooperative;Appropriate to situation  ?Mood Pleasant  ?Thought Process  ?Coherency WDL  ?Content Blaming others  ?Delusions None reported or observed  ?Perception WDL  ?Hallucination None reported or observed  ?Judgment Poor  ?Confusion None  ?Danger to Self  ?Current suicidal ideation? Denies  ?Danger to Others  ?Danger to Others None reported or observed  ? ? ?

## 2021-03-20 NOTE — H&P (Signed)
Psychiatric Admission Assessment Adult  Patient Identification: Dylan [name] MRN:  [id number] Date of Evaluation:  03/20/2021 Chief Complaint:  Calculus of gallbladder without cholecystitis without obstruction [K80.20] Bipolar 1 disorder, depressed (Dylan [name]) [F31.9] Principal Diagnosis: Schizoaffective disorder, bipolar type ([name]) Diagnosis:  Principal Problem:   Schizoaffective disorder, bipolar type (Dylan [name]) Active Problems:   Tobacco abuse   Generalized anxiety disorder   Alcohol abuse   Vitamin D deficiency   Cocaine abuse (Dylan [name])  History of Present Illness: Patient is a 56 year old male with past psychiatric history of schizoaffective disorder bipolar type, GAD, alcohol abuse, tobacco abuse, cocaine abuse and past medical history of HIV, hep B, degenerative disc disease, history of rhabdomyolysis, prediabetes and liver lesion who initially presented to [name] on 03/13/2021 with suicidal ideations with a plan to shoot himself with a double barrel shotgun and auditory hallucinations.  Patient was assessed by psychiatry and observed for 2 days at [name] observation unit and then transferred to [name] inpatient adult unit on 03/15/2021.    Cymbalta was held due to elevated liver enzymes. Seroquel and gabapentin were increased for improved mood and anxiety control. His mood did show significant improvement, and anxiety also showed improvement in a few days. By Friday, it was postulated that the patient would likely be appropriate for a discharge on Monday. On Saturday, however, the patient complained of severe peri-umbilical pain, and was concerned for acute abdomen. He was sent to the ER (and discharge summary noted per 03/19/21). He was medically cleared and returned same day, thoughts to have Cholelithiasis without cholecystitis. He went on to develop vomiting and diarrhea after arriving back on the unit, no fever, no cough, nasal congestion. He denies hallucinations, thoughts of harm to self or others. He  is irritable about 10 min delay between pressing call bell and response, [facility] because "I don't trust the [facility]". After a few minutes of discussion, he is willing to stay, as he does not have a hospital in mind that he wants to transfer to at this time.  Associated Signs/Symptoms: Depression Symptoms:  insomnia, psychomotor retardation, fatigue, difficulty concentrating, anxiety, loss of energy/fatigue, Duration of Depression Symptoms: Greater than two weeks  (Hypo) Manic Symptoms:  Irritable Mood, Anxiety Symptoms:   na Psychotic Symptoms:   na PTSD Symptoms: Negative Total Time spent with patient: 30 minutes  Past Psychiatric History:  Previous Psych Diagnoses: Schizoaffective disorder bipolar type Prior inpatient treatment: Multiple previous psychiatric admissions with last admission at [name] on 04/28/2020 where he was admitted for suicidal thoughts and was discharged on gabapentin 600 mg daily and 900 mg nightly, Seroquel 200 mg nightly, trazodone 150 mg nightly.  His home Cymbalta was held during last hospitalization due to high liver enzymes. Current/prior outpatient treatment: Gabapentin 600 mg daily and 900 mg nightly, Seroquel 200 mg nightly, trazodone 150 mg nightly.  His home Cymbalta 30 mg was held during last hospitalization due to high liver enzymes. Prior rehab hx: He was at [name] before this hospitalization Psychotherapy hx: Received few months of psychotherapy at [name]. History of suicide: Multiple suicidal attempts with last suicide attempt last year.  Reports suicidal attempts with overdose and 1 time he went to the top of apartment building. History of homicide: None Psychiatric medication compliance history: He has been off medication for last 2 weeks due to change in insurance and not able to fill his medication. Neuromodulation history: None Current Psychiatrist: Last refill  received from  [name].  Few refills received from [name]. Current therapist: None  Is the patient at risk to self? No.  Has the patient been a risk to self in the past 6 months? Yes.    Has the patient been a risk to self within the distant past? Yes.    Is the patient a risk to others? No.  Has the patient been a risk to others in the past 6 months? No.  Has the patient been a risk to others within the distant past? No.   Prior Inpatient Therapy:   Prior Outpatient Therapy:    Alcohol Screening: 1. How often do you have a drink containing alcohol?: Monthly or less 2. How many drinks containing alcohol do you have on a typical day when you are drinking?: 3 or 4 3. How often do you have six or more drinks on one occasion?: Less than monthly AUDIT-C Score: 3 4. How often during the last year have you found that you were not able to stop drinking once you had started?: Never 5. How often during the last year have you failed to do what was normally expected from you because of drinking?: Never 6. How often during the last year have you needed a first drink in the morning to get yourself going after a heavy drinking session?: Never 7. How often during the last year have you had a feeling of guilt of remorse after drinking?: Never 8. How often during the last year have you been unable to remember what happened the night before because you had been drinking?: Never 9. Have you or someone else been injured as a result of your drinking?: No 10. Has a relative or friend or a doctor or another health worker been concerned about your drinking or suggested you cut down?: No Alcohol Use Disorder Identification Test Final Score (AUDIT): 3 Substance Abuse History in the last 12 months:  Yes.   Consequences of Substance Abuse: Medical Consequences:  HIV Legal Consequences:    Family Consequences:    Previous Psychotropic Medications: Yes  Psychological Evaluations: Yes  Past Medical History:  Past Medical History:   Diagnosis Date   Anxiety    Arthritis    Bipolar 1 disorder ([place])    Colon polyps    Depression    GERD (gastroesophageal reflux disease)    Hepatitis B    Human immunodeficiency virus (HIV) ([place])    Hyperlipidemia    Hypertension    Neuromuscular disorder ([place])    neuropathy   Neuropathy    Pre-diabetes    Schizophrenia ([place])     Past Surgical History:  Procedure Laterality Date   FOOT ARTHRODESIS Right 09/17/2019   Procedure: FUSION RIGHT LISFRANC JOINT;  Surgeon: Newt Minion, MD;  Location: [place];  Service: Orthopedics;  Laterality: Right;   FOOT ARTHRODESIS Right 09/2019   HEMORROIDECTOMY     IR FLUORO GUIDE CV LINE RIGHT  02/22/2017   IR US GUIDE VASC ACCESS RIGHT  02/22/2017   TUMOR REMOVAL     From Chest   Family History:  Family History  Problem Relation Age of Onset   CAD Mother    Diabetes Father    Colon cancer Maternal Aunt 60   Diabetes Maternal Aunt    Heart disease Maternal Uncle    Esophageal cancer Neg Hx    Rectal cancer Neg Hx    Stomach cancer Neg Hx    Family Psychiatric  History: Medical: Father died  in 2002 due to stroke Psych: Mom has mental issues -but he does not know diagnosis.  Uncle has?  Schizophrenia.   No suicidal attempt in the family Substance use family hx: Unknown Tobacco Screening:   Social History:  Social History   Substance and Sexual Activity  Alcohol Use Yes   Comment: occassional drinker     Social History   Substance and Sexual Activity  Drug Use Yes   Types: Cocaine   Comment: last use was Friday 03/05/2020    Additional Social History: Marital status: Long term relationship Long term relationship, how long?: 3-4 years What types of issues is patient dealing with in the relationship?: Communication issues, differences in views and opinions, patient also states that he is working on creating boudaries due to his motivation to stay sober Additional relationship information: does not identify Are you sexually  active?: Yes What is your sexual orientation?: heterosexual Has your sexual activity been affected by drugs, alcohol, medication, or emotional stress?: Denies Does patient have children?: Yes How many children?: 1 How is patient's relationship with their children?: Deceased                         Allergies:   Allergies  Allergen Reactions   Penicillins Other (See Comments)    Childhood allergy   Latex Rash   Tape Rash   Lab Results:  Results for orders placed or performed during the hospital encounter of 03/15/21 (from the past 48 hour(s))  Comprehensive metabolic panel     Status: Abnormal   Collection Time: 03/19/21  9:05 AM  Result Value Ref Range   Sodium 137 135 - 145 mmol/L   Potassium 4.4 3.5 - 5.1 mmol/L   Chloride 104 98 - 111 mmol/L   CO2 24 22 - 32 mmol/L   Glucose, Bld 103 (H) 70 - 99 mg/dL    Comment: Glucose reference range applies only to samples taken after fasting for at least 8 hours.   BUN 15 6 - 20 mg/dL   Creatinine, Ser 1.12 0.61 - 1.24 mg/dL   Calcium 9.2 8.9 - 10.3 mg/dL   Total Protein 7.3 6.5 - 8.1 g/dL   Albumin 4.0 3.5 - 5.0 g/dL   AST 45 (H) 15 - 41 U/L   ALT 79 (H) 0 - 44 U/L   Alkaline Phosphatase 78 38 - 126 U/L   Total Bilirubin 0.4 0.3 - 1.2 mg/dL   GFR, Estimated >60 >60 mL/min    Comment: (NOTE) Calculated using the CKD-EPI Creatinine Equation (2021)    Anion gap 9 5 - 15    Comment: Performed at [facility], [place] [address], [place], [place] [address]  CBC with Differential     Status: None   Collection Time: 03/19/21  9:05 AM  Result Value Ref Range   WBC 9.3 4.0 - 10.5 K/uL   RBC 4.79 4.22 - 5.81 MIL/uL   Hemoglobin 14.6 13.0 - 17.0 g/dL   HCT 43.6 39.0 - 52.0 %   MCV 91.0 80.0 - 100.0 fL   MCH 30.5 26.0 - 34.0 pg   MCHC 33.5 30.0 - 36.0 g/dL   RDW 13.2 11.5 - 15.5 %   Platelets 174 150 - 400 K/uL   nRBC 0.0 0.0 - 0.2 %   Neutrophils Relative % 82 %   Neutro Abs 7.6 1.7 - 7.7 K/uL    Lymphocytes Relative 11 %   Lymphs Abs 1.0 0.7 -  4.0 K/uL   Monocytes Relative 6 %   Monocytes Absolute 0.5 0.1 - 1.0 K/uL   Eosinophils Relative 1 %   Eosinophils Absolute 0.1 0.0 - 0.5 K/uL   Basophils Relative 0 %   Basophils Absolute 0.0 0.0 - 0.1 K/uL   Immature Granulocytes 0 %   Abs Immature Granulocytes 0.03 0.00 - 0.07 K/uL    Comment: Performed at [facility], [place] [address] [place], [name] [address]  Lipase, blood     Status: None   Collection Time: 03/19/21  9:05 AM  Result Value Ref Range   Lipase 41 11 - 51 U/L    Comment: Performed at [facility], [place] [address], [place], [place] [address]  Urinalysis, Routine w reflex microscopic Urine, Clean Catch     Status: None   Collection Time: 03/19/21 11:34 AM  Result Value Ref Range   Color, Urine YELLOW YELLOW   APPearance CLEAR CLEAR   Specific Gravity, Urine 1.016 1.005 - 1.030   pH 5.0 5.0 - 8.0   Glucose, UA NEGATIVE NEGATIVE mg/dL   Hgb urine dipstick NEGATIVE NEGATIVE   Bilirubin Urine NEGATIVE NEGATIVE   Ketones, ur NEGATIVE NEGATIVE mg/dL   Protein, ur NEGATIVE NEGATIVE mg/dL   Nitrite NEGATIVE NEGATIVE   Leukocytes,Ua NEGATIVE NEGATIVE    Comment: Performed at [facility], [place] [address] [place], [place] [address]    Blood Alcohol level:  Lab Results  Component Value Date   [facility] <10 03/13/2021   ETH <10 93/81/8299    Metabolic Disorder Labs:  Lab Results  Component Value Date   HGBA1C 5.7 (H) 03/13/2021   MPG 116.89 03/13/2021   MPG 125.5 10/26/2020   Lab Results  Component Value Date   PROLACTIN 12.1 03/08/2020   PROLACTIN 2.6 (L) 11/09/2019   Lab Results  Component Value Date   CHOL 189 03/17/2021   TRIG 349 (H) 03/17/2021   HDL 44 03/17/2021   CHOLHDL 4.3 03/17/2021   VLDL 70 (H) 03/17/2021   LDLCALC 75 03/17/2021   LDLCALC 128 (H) 03/13/2021    Current Medications: Current Facility-Administered Medications  Medication  Dose Route Frequency Provider Last Rate Last Admin   alum & mag hydroxide-simeth (MAALOX/MYLANTA) 200-200-20 MG/5ML suspension 30 mL  30 mL Oral Q4H PRN Revonda Humphrey, NP   30 mL at 03/16/21 1717   bictegravir-emtricitabine-tenofovir AF (BIKTARVY) 50-200-25 MG per tablet 1 tablet  1 tablet Oral Daily Revonda Humphrey, NP   1 tablet at 03/20/21 1019   dicyclomine (BENTYL) tablet 20 mg  20 mg Oral Once PRN [place]ola, Ene A, NP       gabapentin (NEURONTIN) capsule 300 mg  300 mg Oral TID Armando Reichert, MD   300 mg at 03/20/21 1134   hydrOXYzine (ATARAX) tablet 25 mg  25 mg Oral TID PRN Revonda Humphrey, NP   25 mg at 03/17/21 1823   ibuprofen (ADVIL) tablet 400 mg  400 mg Oral Q6H PRN Massengill, Ovid Curd, MD   400 mg at 03/19/21 1715   OLANZapine zydis (ZYPREXA) disintegrating tablet 10 mg  10 mg Oral Q8H PRN Revonda Humphrey, NP       And   LORazepam (ATIVAN) tablet 1 mg  1 mg Oral PRN Revonda Humphrey, NP       And   ziprasidone (GEODON) injection 20 mg  20 mg Intramuscular PRN Revonda Humphrey, NP       magnesium hydroxide (MILK OF MAGNESIA)  suspension 30 mL  30 mL Oral Daily PRN Revonda Humphrey, NP       nicotine (NICODERM CQ - dosed in mg/24 hours) patch 14 mg  14 mg Transdermal Daily Massengill, Nathan, MD   14 mg at 03/17/21 1157   ondansetron (ZOFRAN-ODT) disintegrating tablet 4 mg  4 mg Oral Q6H PRN Maida Sale, MD   4 mg at 03/19/21 1950   prazosin (MINIPRESS) capsule 1 mg  1 mg Oral QHS Armando Reichert, MD   1 mg at 03/19/21 2106   QUEtiapine (SEROQUEL XR) 24 hr tablet 50 mg  50 mg Oral Daily Armando Reichert, MD   50 mg at 03/19/21 1254   QUEtiapine (SEROQUEL) tablet 200 mg  200 mg Oral QHS Massengill, Ovid Curd, MD   200 mg at 03/19/21 2106   traZODone (DESYREL) tablet 100 mg  100 mg Oral QHS Armando Reichert, MD   100 mg at 03/19/21 2106   traZODone (DESYREL) tablet 50 mg  50 mg Oral QHS PRN Revonda Humphrey, NP   50 mg at 03/20/21 3299   PTA Medications: Medications  Prior to Admission  Medication Sig Dispense Refill Last Dose   bictegravir-emtricitabine-tenofovir AF (BIKTARVY) 50-200-25 MG TABS tablet Take 1 tablet by mouth daily. 30 tablet     DULoxetine (CYMBALTA) 30 MG capsule Take 1 capsule (30 mg total) by mouth daily.  3    gabapentin (NEURONTIN) 300 MG capsule Take 1 capsule (300 mg total) by mouth 2 (two) times daily.      hydrOXYzine (ATARAX) 25 MG tablet Take 1 tablet (25 mg total) by mouth 3 (three) times daily as needed for anxiety. 60 tablet 1    QUEtiapine (SEROQUEL) 100 MG tablet Take 1 tablet (100 mg total) by mouth at bedtime.      rosuvastatin (CRESTOR) 20 MG tablet Take 1 tablet (20 mg total) by mouth daily. 30 tablet 1    traZODone (DESYREL) 50 MG tablet Take 1 tablet (50 mg total) by mouth at bedtime as needed for sleep.       Musculoskeletal: Strength & Muscle Tone: within normal limits Gait & Station: normal Patient leans: N/A            Psychiatric Specialty Exam:  Presentation  General Appearance: Casual  Eye Contact:Fair  Speech:Clear and Coherent  Speech Volume:Normal  Handedness:Right   Mood and Affect  Mood:Dysphoric; Anxious  Affect:Congruent; Constricted   Thought Process  Thought Processes:Coherent  Duration of Psychotic Symptoms: Greater than six months  Past Diagnosis of Schizophrenia or Psychoactive disorder: Yes  Descriptions of Associations:Intact  Orientation:Full (Time, Place and Person)  Thought Content:Logical  Hallucinations:No data recorded Ideas of Reference:None  Suicidal Thoughts:No data recorded Homicidal Thoughts:No data recorded  Sensorium  Memory:Immediate Good; Remote Good  Judgment:Poor  Insight:Fair   Executive Functions  Concentration:Fair  Attention Span:Good  [place] of Knowledge:Good  Language:Good   Psychomotor Activity  Psychomotor Activity:No data recorded  Assets  Assets:Communication Skills; Desire for Improvement;  Financial Resources/Insurance; Physical Health; Resilience   Sleep  Sleep:No data recorded   Physical Exam: Physical Exam ROS Blood pressure (!) 148/73, pulse (!) 107, temperature 98.5 F (36.9 C), temperature source Oral, resp. rate 18, height 6\' 2"  (1.88 m), weight 105.2 kg, SpO2 98 %. Body mass index is 29.79 kg/m.  Treatment Plan Summary: Daily contact with patient to assess and evaluate symptoms and progress in treatment and Medication management  Observation Level/Precautions:  Detox 15 minute checks  Laboratory:  CBC Chemistry  Profile  Psychotherapy:  group, milieu  Medications:  continue previous, add zofran  Consultations:  ?internal medicine  Discharge Concerns:  primary care  Estimated LOS: 3-5  Other:     Physician Treatment Plan for Primary Diagnosis: Schizoaffective disorder, bipolar type ([place]) Long Term Goal(s): Improvement in symptoms so as ready for discharge  Short Term Goals: Ability to identify changes in lifestyle to reduce recurrence of condition will improve, Ability to verbalize feelings will improve, Ability to disclose and discuss suicidal ideas, Ability to demonstrate self-control will improve, Ability to identify and develop effective coping behaviors will improve, Ability to maintain clinical measurements within normal limits will improve, Compliance with prescribed medications will improve, and Ability to identify triggers associated with substance abuse/mental health issues will improve  Physician Treatment Plan for Secondary Diagnosis: Principal Problem:   Schizoaffective disorder, bipolar type ([place]) Active Problems:   Tobacco abuse   Generalized anxiety disorder   Alcohol abuse   Vitamin D deficiency   Cocaine abuse ([place])  Long Term Goal(s): Improvement in symptoms so as ready for discharge  Short Term Goals: Ability to identify changes in lifestyle to reduce recurrence of condition will improve, Ability to verbalize feelings will improve,  Ability to disclose and discuss suicidal ideas, Ability to demonstrate self-control will improve, Ability to identify and develop effective coping behaviors will improve, Ability to maintain clinical measurements within normal limits will improve, Compliance with prescribed medications will improve, and Ability to identify triggers associated with substance abuse/mental health issues will improve  I certify that inpatient services furnished can reasonably be expected to improve the patient's condition.    Maida Sale, MD 3/12/20233:50 PM

## 2021-03-20 NOTE — [facility] Group Notes (Signed)
Adult Psychoeducational Group  ?Date:  03/20/2021 ?Time:  0900-1045 ?Group Topic/Focus: Continuation of the group from Saturday. Looking at the lists that were created and talking about what needs to be done with the homework of 30 positives about themselves.  ?                                   Talking about taking their power back and helping themselves to develop a positive self esteem. ?     ?Participation Quality: did not attend ? ?Katheryn Culliton A ? ?

## 2021-03-20 NOTE — Progress Notes (Signed)
Pt at nurse's station c/o diarrhea again. Given dose of Imodium ordered by provider. Pt denies any stomach pains right now even though report 30 minutes ago was that he was experiencing stomach pain. Pt says he probably won't eat any solid food today. Agreed that this is a good idea until the cause of his emesis/diarrhea is discovered. ?

## 2021-03-20 NOTE — Progress Notes (Signed)
Patient ID: Dylan [name], male   DOB: [birth year], 56 y.o.   MRN: [id number] ?Patient continues to ring the call button several times this morning. Gave him a 2nd dose of immodium. Patient asked for jello and broth. Informed him that we do not have either items on the adult unit. Gave patient ginger ale and asked him to take small sips. Informed provider of patient's gastro symptoms. Patient yelled, "I want to be transferred out of here." Patient also informed [facility] that he wanted to be transferred "out of the [facility] system.:" Patient will be informed he can leave AMA since he is voluntary. Patient was living in an [facility] before admission. Not sure if he can return to same living arrangements.  ?

## 2021-03-20 NOTE — Progress Notes (Signed)
Pt came out of room after  having diarrhea and requested medication he had been given last night for this same thing.  When RN went to pull medication it was found that this had been a one time order.  Pt  extremely angry over this, Pt states loudly  "are you just going to wait until I shit myself to give me medication?"  This writer apologized to Pt and told him we would get the order and bring the medication to him in his room.  Pt then returned to room.  RN and this Probation officer went to NP and explained situation and order was immediately put in for Pt.  Medication pulled and taken to Pt immediately.  Pt stated "I just didn't want to shit myself, after what I went through last night".  Pt calmer and resting in bed, will continue to monitor.   ?
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
## 2021-03-20 NOTE — Progress Notes (Signed)
Pt reported to tech that he had another bout of diarrhea. Provider notified. Pt laying back down.  ?

## 2021-03-21 ENCOUNTER — Encounter (HOSPITAL_COMMUNITY): Payer: Self-pay

## 2021-03-21 MED ORDER — TRAZODONE HCL 100 MG PO TABS: 100.0000 mg | ORAL_TABLET | Freq: Every day | ORAL | 0 refills | Status: DC

## 2021-03-21 MED ORDER — QUETIAPINE FUMARATE ER 50 MG PO TB24: 50.0000 mg | ORAL_TABLET | Freq: Every day | ORAL | 0 refills | Status: DC

## 2021-03-21 MED ORDER — BIKTARVY 50-200-25 MG PO TABS: 1.0000 | ORAL_TABLET | Freq: Every day | ORAL | 0 refills | Status: DC

## 2021-03-21 MED ORDER — NICOTINE 14 MG/24HR TD PT24: 14.0000 mg | MEDICATED_PATCH | Freq: Every day | TRANSDERMAL | 0 refills | Status: DC

## 2021-03-21 MED ORDER — HYDROXYZINE HCL 25 MG PO TABS: 25.0000 mg | ORAL_TABLET | Freq: Three times a day (TID) | ORAL | 0 refills | Status: DC | PRN

## 2021-03-21 MED ORDER — PRAZOSIN HCL 1 MG PO CAPS: 1.0000 mg | ORAL_CAPSULE | Freq: Every day | ORAL | 0 refills | Status: DC

## 2021-03-21 MED ORDER — GABAPENTIN 300 MG PO CAPS: 300.0000 mg | ORAL_CAPSULE | Freq: Three times a day (TID) | ORAL | 0 refills | Status: DC

## 2021-03-21 MED ORDER — QUETIAPINE FUMARATE 200 MG PO TABS: 200.0000 mg | ORAL_TABLET | Freq: Every day | ORAL | 0 refills | Status: DC

## 2021-03-21 NOTE — BH IP Treatment Plan (Signed)
Interdisciplinary Treatment and Diagnostic Plan Update  03/21/2021 Time of Session: 10:15am Dylan [name] MRN: [id number]  Principal Diagnosis: Schizoaffective disorder, bipolar type ([place])  Secondary Diagnoses: Principal Problem:   Schizoaffective disorder, bipolar type ([place]) Active Problems:   Tobacco abuse   Generalized anxiety disorder   Alcohol abuse   Vitamin D deficiency   Cocaine abuse ([place])   Current Medications:  Current Facility-Administered Medications  Medication Dose Route Frequency Provider Last Rate Last Admin   alum & mag hydroxide-simeth (MAALOX/MYLANTA) 200-200-20 MG/5ML suspension 30 mL  30 mL Oral Q4H PRN Revonda Humphrey, NP   30 mL at 03/20/21 1815   bictegravir-emtricitabine-tenofovir AF (BIKTARVY) 50-200-25 MG per tablet 1 tablet  1 tablet Oral Daily Revonda Humphrey, NP   1 tablet at 03/21/21 0806   gabapentin (NEURONTIN) capsule 300 mg  300 mg Oral TID Armando Reichert, MD   300 mg at 03/21/21 0806   hydrOXYzine (ATARAX) tablet 25 mg  25 mg Oral TID PRN Revonda Humphrey, NP   25 mg at 03/17/21 1823   ibuprofen (ADVIL) tablet 400 mg  400 mg Oral Q6H PRN Janine Limbo, MD   400 mg at 03/19/21 1715   loperamide (IMODIUM) capsule 4 mg  4 mg Oral Q6H PRN Bobbitt, Shalon E, NP   4 mg at 03/20/21 2317   OLANZapine zydis (ZYPREXA) disintegrating tablet 10 mg  10 mg Oral Q8H PRN Revonda Humphrey, NP       And   LORazepam (ATIVAN) tablet 1 mg  1 mg Oral PRN Revonda Humphrey, NP       And   ziprasidone (GEODON) injection 20 mg  20 mg Intramuscular PRN Revonda Humphrey, NP       magnesium hydroxide (MILK OF MAGNESIA) suspension 30 mL  30 mL Oral Daily PRN Revonda Humphrey, NP       nicotine (NICODERM CQ - dosed in mg/24 hours) patch 14 mg  14 mg Transdermal Daily Massengill, Nathan, MD   14 mg at 03/17/21 1157   ondansetron (ZOFRAN-ODT) disintegrating tablet 4 mg  4 mg Oral Q6H PRN Maida Sale, MD   4 mg at 03/21/21 0110   prazosin (MINIPRESS)  capsule 1 mg  1 mg Oral QHS Doda, Edd Arbour, MD   1 mg at 03/20/21 2132   QUEtiapine (SEROQUEL XR) 24 hr tablet 50 mg  50 mg Oral Daily Armando Reichert, MD   50 mg at 03/21/21 0806   QUEtiapine (SEROQUEL) tablet 200 mg  200 mg Oral QHS Massengill, Ovid Curd, MD   200 mg at 03/20/21 2132   traZODone (DESYREL) tablet 100 mg  100 mg Oral QHS Armando Reichert, MD   100 mg at 03/20/21 2132   traZODone (DESYREL) tablet 50 mg  50 mg Oral QHS PRN Revonda Humphrey, NP   50 mg at 03/20/21 9470   PTA Medications: Medications Prior to Admission  Medication Sig Dispense Refill Last Dose   bictegravir-emtricitabine-tenofovir AF (BIKTARVY) 50-200-25 MG TABS tablet Take 1 tablet by mouth daily. 30 tablet     DULoxetine (CYMBALTA) 30 MG capsule Take 1 capsule (30 mg total) by mouth daily.  3    gabapentin (NEURONTIN) 300 MG capsule Take 1 capsule (300 mg total) by mouth 2 (two) times daily.      QUEtiapine (SEROQUEL) 100 MG tablet Take 1 tablet (100 mg total) by mouth at bedtime.      rosuvastatin (CRESTOR) 20 MG tablet Take 1 tablet (20 mg total) by mouth  daily. 30 tablet 1    traZODone (DESYREL) 50 MG tablet Take 1 tablet (50 mg total) by mouth at bedtime as needed for sleep.      [DISCONTINUED] hydrOXYzine (ATARAX) 25 MG tablet Take 1 tablet (25 mg total) by mouth 3 (three) times daily as needed for anxiety. 60 tablet 1     Patient Stressors:    Patient Strengths:    Treatment Modalities: Medication Management, Group therapy, Case management,  1 to 1 session with clinician, Psychoeducation, Recreational therapy.   Physician Treatment Plan for Primary Diagnosis: Schizoaffective disorder, bipolar type ([place]) Long Term Goal(s): Improvement in symptoms so as ready for discharge   Short Term Goals: Ability to identify changes in lifestyle to reduce recurrence of condition will improve Ability to verbalize feelings will improve Ability to disclose and discuss suicidal ideas Ability to demonstrate self-control  will improve Ability to identify and develop effective coping behaviors will improve Ability to maintain clinical measurements within normal limits will improve Compliance with prescribed medications will improve Ability to identify triggers associated with substance abuse/mental health issues will improve  Medication Management: Evaluate patient's response, side effects, and tolerance of medication regimen.  Therapeutic Interventions: 1 to 1 sessions, Unit Group sessions and Medication administration.  Evaluation of Outcomes: Adequate for Discharge  Physician Treatment Plan for Secondary Diagnosis: Principal Problem:   Schizoaffective disorder, bipolar type ([place]) Active Problems:   Tobacco abuse   Generalized anxiety disorder   Alcohol abuse   Vitamin D deficiency   Cocaine abuse ([place])  Long Term Goal(s): Improvement in symptoms so as ready for discharge   Short Term Goals: Ability to identify changes in lifestyle to reduce recurrence of condition will improve Ability to verbalize feelings will improve Ability to disclose and discuss suicidal ideas Ability to demonstrate self-control will improve Ability to identify and develop effective coping behaviors will improve Ability to maintain clinical measurements within normal limits will improve Compliance with prescribed medications will improve Ability to identify triggers associated with substance abuse/mental health issues will improve     Medication Management: Evaluate patient's response, side effects, and tolerance of medication regimen.  Therapeutic Interventions: 1 to 1 sessions, Unit Group sessions and Medication administration.  Evaluation of Outcomes: Adequate for Discharge   RN Treatment Plan for Primary Diagnosis: Schizoaffective disorder, bipolar type ([place]) Long Term Goal(s): Knowledge of disease and therapeutic regimen to maintain health will improve  Short Term Goals: Ability to remain free from injury will  improve, Ability to verbalize frustration and anger appropriately will improve, Ability to identify and develop effective coping behaviors will improve, and Compliance with prescribed medications will improve  Medication Management: RN will administer medications as ordered by provider, will assess and evaluate patient's response and provide education to patient for prescribed medication. RN will report any adverse and/or side effects to prescribing provider.  Therapeutic Interventions: 1 on 1 counseling sessions, Psychoeducation, Medication administration, Evaluate responses to treatment, Monitor vital signs and CBGs as ordered, Perform/monitor CIWA, COWS, AIMS and Fall Risk screenings as ordered, Perform wound care treatments as ordered.  Evaluation of Outcomes: Adequate for Discharge   LCSW Treatment Plan for Primary Diagnosis: Schizoaffective disorder, bipolar type ([place]) Long Term Goal(s): Safe transition to appropriate next level of care at discharge, Engage patient in therapeutic group addressing interpersonal concerns.  Short Term Goals: Engage patient in aftercare planning with referrals and resources, Increase social support, Increase ability to appropriately verbalize feelings, Identify triggers associated with mental health/substance abuse issues, and Increase skills for  wellness and recovery  Therapeutic Interventions: Assess for all discharge needs, 1 to 1 time with Social worker, Explore available resources and support systems, Assess for adequacy in community support network, Educate family and significant other(s) on suicide prevention, Complete Psychosocial Assessment, Interpersonal group therapy.  Evaluation of Outcomes: Adequate for Discharge   Progress in Treatment: Attending groups: Yes. Participating in groups: Yes. Taking medication as prescribed: Yes. Toleration medication: Yes. Family/Significant other contact made: Yes, individual(s) contacted:  If consents are  provided  Patient understands diagnosis: Yes. Discussing patient identified problems/goals with staff: Yes. Medical problems stabilized or resolved: Yes. Denies suicidal/homicidal ideation: Yes. Issues/concerns per patient self-inventory: No.     New problem(s) identified: No, Describe:  None    New Short Term/Long Term Goal(s): medication stabilization, elimination of SI thoughts, development of comprehensive mental wellness plan.    Patient Goals: "To stay focused and in recovery"   Discharge Plan or Barriers: Patient is to return to [facility] and is to follow up at [facility]    Reason for Continuation of Hospitalization:  Medication stabilization    Estimated Length of Stay: Adequate for discharge   Scribe for Treatment Team: Vassie Moselle, LCSW 03/21/2021 10:34 AM

## 2021-03-21 NOTE — Discharge Summary (Signed)
Physician Discharge Summary Note  Patient:  Dylan [name] is an 56 y.o., male MRN:  [id number] DOB:  Aug 03, 1965 Patient phone:  [phone] (home)  Patient address:   [address] [address],  Total Time spent with patient: 30 minutes  Date of Admission:  03/15/2021 Date of Discharge: 03/21/21  Reason for Admission:   Patient is a 56 year old male with past psychiatric history of schizoaffective disorder bipolar type, GAD, alcohol abuse, tobacco abuse, cocaine abuse and past medical history of HIV, hep B, degenerative disc disease, history of rhabdomyolysis, prediabetes and liver lesion who initially presented to [facility] on 03/13/2021 with suicidal ideations with a plan to shoot himself with a double barrel shotgun and auditory hallucinations. Principal Problem: Schizoaffective disorder, bipolar type [facility]) Discharge Diagnoses: Principal Problem:   Schizoaffective disorder, bipolar type ([place]) Active Problems:   Tobacco abuse   Generalized anxiety disorder   Alcohol abuse   Vitamin D deficiency   Cocaine abuse ([place])   Past Psychiatric History: Previous Psych Diagnoses: Schizoaffective disorder bipolar type Prior inpatient treatment: Multiple previous psychiatric admissions with last admission at [facility] on 04/28/2020 where he was admitted for suicidal thoughts and was discharged on gabapentin 600 mg daily and 900 mg nightly, Seroquel 200 mg nightly, trazodone 150 mg nightly.  His home Cymbalta was held during last hospitalization due to high liver enzymes. Current/prior outpatient treatment: Gabapentin 600 mg daily and 900 mg nightly, Seroquel 200 mg nightly, trazodone 150 mg nightly.  His home Cymbalta 30 mg was held during last hospitalization due to high liver enzymes. Prior rehab hx: He was at [facility] x 2 months before this hospitalization Psychotherapy hx: Received few months of psychotherapy at [facility]. History of suicide: Multiple suicidal attempts with last  suicide attempt last year.  Reports suicidal attempts with overdose and 1 time he went to the top of apartment building. History of homicide: None Psychiatric medication compliance history: He has been off medication for last 2 weeks due to change in insurance and not able to fill his medication. Neuromodulation history: None Current Psychiatrist: Last refill received from [facility].  Few refills received from [facility]. Current therapist: None    Past Medical History:  Past Medical History:  Diagnosis Date   Anxiety    Arthritis    Bipolar 1 disorder ([place])    Colon polyps    Depression    GERD (gastroesophageal reflux disease)    Hepatitis B    Human immunodeficiency virus (HIV) ([place])    Hyperlipidemia    Hypertension    Neuromuscular disorder ([place])    neuropathy   Neuropathy    Pre-diabetes    Schizophrenia ([place])     Past Surgical History:  Procedure Laterality Date   FOOT ARTHRODESIS Right 09/17/2019   Procedure: FUSION RIGHT LISFRANC JOINT;  Surgeon: Newt Minion, MD;  Location: [place];  Service: Orthopedics;  Laterality: Right;   FOOT ARTHRODESIS Right 09/2019   HEMORROIDECTOMY     IR FLUORO GUIDE CV LINE RIGHT  02/22/2017   IR US GUIDE VASC ACCESS RIGHT  02/22/2017   TUMOR REMOVAL     From Chest   Family History:  Family History  Problem Relation Age of Onset   CAD Mother    Diabetes Father    Colon cancer Maternal Aunt 60   Diabetes Maternal Aunt    Heart disease Maternal Uncle    Esophageal cancer Neg Hx    Rectal cancer Neg Hx  Stomach cancer Neg Hx    Family Psychiatric  History: Medical: Father died in April 23, 2000 due to stroke Psych: Mom has mental issues -but he does not know diagnosis.  Uncle has?  Schizophrenia.   No suicidal attempt in the family Substance use family hx: Unknown Social History:  Social History   Substance and Sexual Activity  Alcohol Use Yes   Comment: occassional drinker     Social History   Substance and Sexual Activity  Drug Use  Yes   Types: Cocaine   Comment: last use was Friday 03/05/2020    Social History   Socioeconomic History   Marital status: Married    Spouse name: Not on file   Number of children: 1   Years of education: Not on file   Highest education level: Not on file  Occupational History   Not on file  Tobacco Use   Smoking status: Every Day    Packs/day: 0.50    Years: 25.00    Pack years: 12.50    Types: E-cigarettes, Cigarettes   Smokeless tobacco: Never   Tobacco comments:    has patches when he is ready to quit  Vaping Use   Vaping Use: Every day   Start date: 12/09/2020   Substances: Nicotine  Substance and Sexual Activity   Alcohol use: Yes    Comment: occassional drinker   Drug use: Yes    Types: Cocaine    Comment: last use was Friday 03/05/2020   Sexual activity: Yes    Partners: Female    Comment: condoms given  Other Topics Concern   Not on file  Social History Narrative   Not on file   Social Determinants of Health   Financial Resource Strain: Not on file  Food Insecurity: Not on file  Transportation Needs: Not on file  Physical Activity: Not on file  Stress: Not on file  Social Connections: Not on file    Hospital Course: Patient is a 56 year old male with past psychiatric history of schizoaffective disorder bipolar type, GAD, alcohol abuse, tobacco abuse, cocaine abuse and past medical history of HIV, hep B, degenerative disc disease, history of rhabdomyolysis, prediabetes and liver lesion who initially presented to [facility] on 03/13/2021 with suicidal ideations with a plan to shoot himself with a double barrel shotgun and auditory hallucinations.   HOSPITAL COURSE:   During the patient's hospitalization, patient had extensive initial psychiatric evaluation, and follow-up psychiatric evaluations every day.   Psychiatric diagnoses provided upon initial assessment:  Schizoaffective disorder-bipolar type current episode depressed GAD Cocaine abuse  Alcohol  use Patient's psychiatric medications were adjusted on admission:  Cymbalta was held due to high liver enzymes. -Continued on home Seroquel 100 mg nightly -Continued on gabapentin 300 mg twice daily   During the hospitalization, other adjustments were made to the patient's psychiatric medication regimen:  -Seroquel was increased to 200 mg nightly.  -Started on Seroquel XR 50 mg daily.   -Started on trazodone 100 mg nightly with trazodone 50 mg as needed for sleep. -Started on Minipress 1 mg nightly for nightmares. -Neurontin was increased to 300 mg 3 times daily for foot pain and anxiety.   Patient's care was discussed during the interdisciplinary team meeting every day during the hospitalization.   The patient denies having side effects to prescribed psychiatric medication.   Gradually, patient started adjusting to milieu. The patient was evaluated each day by a clinical provider to ascertain response to treatment. Improvement was noted by the patient's  report of decreasing symptoms, improved sleep and appetite, affect, medication tolerance, behavior, and participation in unit programming.  Patient was asked each day to complete a self inventory noting mood, mental status, pain, new symptoms, anxiety and concerns.   Symptoms were reported as significantly decreased or resolved completely by discharge.  The patient reports that their mood is stable.  The patient denied having suicidal thoughts for more than 48 hours prior to discharge.  Patient denies having homicidal thoughts.  Patient denies having auditory hallucinations.  Patient denies any visual hallucinations or other symptoms of psychosis.  The patient was motivated to continue taking medication with a goal of continued improvement in mental health.    The patient reports their target psychiatric symptoms of depression, anxiety, poor sleep, SI and auditory and visual hallucinations responded well to the psychiatric medications, and the  patient reports overall benefit other psychiatric hospitalization. Supportive psychotherapy was provided to the patient. The patient also participated in regular group therapy while hospitalized. Coping skills, problem solving as well as relaxation therapies were also part of the unit programming. -During this hospitalization, patient developed abdominal pain and was subsequently sent to ED for diagnosis and management.  CT abdomen pelvis with contrast was done and showed cholelithiasis, unchanged mildly enlarged periportal lymph node.  Patient was sent back to [facility].    Labs were reviewed with the patient, and abnormal results were discussed with the patient.  Triglycerides 349, VLDL 70, elevated AST 45, ALT 79 (LFTs on admission was AST 409, ALT 101), hep B surface antigen- reactive, HbA1c 5.7 CT abdomen pelvis with contrast was done and showed cholelithiasis, unchanged mildly enlarged periportal lymph node.    The patient is able to verbalize their individual safety plan to this provider.   # It is recommended to the patient to continue psychiatric medications as prescribed, after discharge from the hospital.     # It is recommended to the patient to follow up with your outpatient psychiatric provider and PCP.   # It was discussed with the patient, the impact of alcohol, drugs, tobacco have been there overall psychiatric and medical wellbeing, and total abstinence from substance use was recommended the patient.ed.   # Prescriptions provided or sent directly to preferred pharmacy at discharge. Patient agreeable to plan. Given opportunity to ask questions. Appears to feel comfortable with discharge.    # In the event of worsening symptoms, the patient is instructed to call the crisis hotline, 911 and or go to the nearest ED for appropriate evaluation and treatment of symptoms. To follow-up with primary care provider for other medical issues, concerns and or health care needs   #Patient seen by this  MD. At time of discharge, consistently refuted any suicidal ideation, intention or plan, denies any Self harm urges. Denies any A/VH and no delusions were elicited and does not seem to be responding to internal stimuli. During assessment the patient is able to verbalize safety plan to use on return home. Patient verbalizes intent to be compliant with medication and outpatient services.    # Patient was discharged [facility] with a plan to follow up as noted below.   Physical Findings: AIMS: Facial and Oral Movements Muscles of Facial Expression: None, normal Lips and Perioral Area: None, normal Jaw: None, normal Tongue: None, normal,Extremity Movements Upper (arms, wrists, hands, fingers): None, normal Lower (legs, knees, ankles, toes): None, normal, Trunk Movements Neck, shoulders, hips: None, normal, Overall Severity Severity of abnormal movements (highest score from questions above):  None, normal Incapacitation due to abnormal movements: None, normal Patient's awareness of abnormal movements (rate only patient's report): No Awareness, Dental Status Current problems with teeth and/or dentures?: No Does patient usually wear dentures?: No  CIWA:    COWS:    Musculoskeletal: Strength & Muscle Tone: within normal limits Gait & Station:  Proofreader Patient leans: N/A    Psychiatric Specialty Exam:  Presentation  General Appearance: Casual  Eye Contact:Fair  Speech:Clear and Coherent  Speech Volume:Normal  Handedness:Right   Mood and Affect  Mood:Euthymic  Affect:Congruent   Thought Process  Thought Processes:Coherent  Descriptions of Associations:Intact  Orientation:Full (Time, Place and Person)  Thought Content:Logical  History of Schizophrenia/Schizoaffective disorder:Yes  Duration of Psychotic Symptoms:Greater than six months  Hallucinations:Hallucinations: None Description of Auditory Hallucinations: none  Ideas of Reference:None  Suicidal  Thoughts:Suicidal Thoughts: No  Homicidal Thoughts:Homicidal Thoughts: No   Sensorium  Memory:Immediate Good; Remote Good  Judgment:Fair (Improving)  Insight:Fair   Executive Functions  Concentration:Good  Attention Span:Good  [place] of Knowledge:Good  Language:Good   Psychomotor Activity  Psychomotor Activity:Psychomotor Activity: Normal   Assets  Assets:Communication Skills; Desire for Improvement; Financial Resources/Insurance; Physical Health; Resilience   Sleep  Sleep:Sleep: Fair    Physical Exam: Physical Exam ROS Blood pressure 131/85, pulse 93, temperature 98.2 F (36.8 C), temperature source Oral, resp. rate 18, height 6\' 2"  (1.88 m), weight 105.2 kg, SpO2 98 %. Body mass index is 29.79 kg/m.   Social History   Tobacco Use  Smoking Status Every Day   Packs/day: 0.50   Years: 25.00   Pack years: 12.50   Types: E-cigarettes, Cigarettes  Smokeless Tobacco Never  Tobacco Comments   has patches when he is ready to quit   Tobacco Cessation:  A prescription for an FDA-approved tobacco cessation medication provided at discharge   Blood Alcohol level:  Lab Results  Component Value Date   [facility] <10 03/13/2021   ETH <10 33/54/5625    Metabolic Disorder Labs:  Lab Results  Component Value Date   HGBA1C 5.7 (H) 03/13/2021   MPG 116.89 03/13/2021   MPG 125.5 10/26/2020   Lab Results  Component Value Date   PROLACTIN 12.1 03/08/2020   PROLACTIN 2.6 (L) 11/09/2019   Lab Results  Component Value Date   CHOL 189 03/17/2021   TRIG 349 (H) 03/17/2021   HDL 44 03/17/2021   CHOLHDL 4.3 03/17/2021   VLDL 70 (H) 03/17/2021   LDLCALC 75 03/17/2021   LDLCALC 128 (H) 03/13/2021    See Psychiatric Specialty Exam and Suicide Risk Assessment completed by Attending Physician prior to discharge.  Discharge destination:  Other:  [place]  Is patient on multiple antipsychotic therapies at discharge:  No   Has Patient had three or  more failed trials of antipsychotic monotherapy by history:  No  Recommended Plan for Multiple Antipsychotic Therapies: NA  Discharge Instructions     Diet - low sodium heart healthy   Complete by: As directed    Increase activity slowly   Complete by: As directed       Allergies as of 03/21/2021       Reactions   Penicillins Other (See Comments)   Childhood allergy   Latex Rash   Tape Rash        Medication List     STOP taking these medications    DULoxetine 30 MG capsule Commonly known as: CYMBALTA   rosuvastatin 20 MG tablet Commonly known as: CRESTOR  TAKE these medications      Indication  Biktarvy 50-200-25 MG Tabs tablet Generic drug: bictegravir-emtricitabine-tenofovir AF Take 1 tablet by mouth daily. Start taking on: March 22, 2021  Indication: HIV Disease   gabapentin 300 MG capsule Commonly known as: NEURONTIN Take 1 capsule (300 mg total) by mouth 3 (three) times daily. What changed: when to take this  Indication: Fibromyalgia Syndrome   hydrOXYzine 25 MG tablet Commonly known as: ATARAX Take 1 tablet (25 mg total) by mouth 3 (three) times daily as needed for anxiety.  Indication: Feeling Anxious   nicotine 14 mg/24hr patch Commonly known as: NICODERM CQ - dosed in mg/24 hours Place 1 patch (14 mg total) onto the skin daily. Start taking on: March 22, 2021  Indication: Nicotine Addiction   prazosin 1 MG capsule Commonly known as: MINIPRESS Take 1 capsule (1 mg total) by mouth at bedtime.  Indication: High Blood Pressure Disorder, Frightening Dreams   QUEtiapine 200 MG tablet Commonly known as: SEROQUEL Take 1 tablet (200 mg total) by mouth at bedtime. What changed:  medication strength how much to take  Indication: Schizophrenia   QUEtiapine 50 MG Tb24 24 hr tablet Commonly known as: SEROQUEL XR Take 1 tablet (50 mg total) by mouth daily. Start taking on: March 22, 2021 What changed: You were already taking a  medication with the same name, and this prescription was added. Make sure you understand how and when to take each.  Indication: Schizophrenia   traZODone 100 MG tablet Commonly known as: DESYREL Take 1 tablet (100 mg total) by mouth at bedtime. What changed:  medication strength how much to take when to take this reasons to take this  Indication: [place] Follow up.   Specialty: Hospice and Palliative Medicine Why: Please call personally to schedule an appointment with this provider for grief/bereavement therapy services. Contact information: [address] [place]. Go on 04/28/2021.   Specialty: Behavioral Health Why: You have an appointment for medication management services on 04/28/21 at 2:30 pm. You also have an appointment for therapy on 05/17/21 at 1:00 pm; (These appointments will be held in person).  * If you need to be seen sooner, please go to this provider during walk in hours:  Monday through Wednesday, from 7:30 am to 10:30 am. Contact information: [facility] [facility] [place]                Follow-up recommendations:  Activity: as tolerated   Diet: heart healthy   Other: -Follow-up with your outpatient psychiatric provider -instructions on appointment date, time, and address (location) are provided to you in discharge paperwork.   -Take your psychiatric medications as prescribed at discharge - instructions are provided to you in the discharge paperwork   -Follow-up with outpatient primary care doctor and other specialists -for management of chronic medical disease, including: Hepatitis B, HIV, cholelithiasis Elevated liver enzymes.  Follow-up with Dr. Sharol Given for foot pain/injury and follow-up for any surgery needs.  Preventative care.   -Testing: Follow-up with outpatient  provider for abnormal lab and imaging results:  Triglycerides 349, VLDL 70, elevated AST 45, ALT 79 (LFTs on admission was AST 409, ALT 101), hep B surface antigen- reactive, HbA1c 5.7 CT abdomen pelvis with contrast was done and showed cholelithiasis, unchanged mildly enlarged periportal lymph  node.   -Recommend abstinence from alcohol, tobacco, and other illicit drug use at discharge.    -If your psychiatric symptoms recur, worsen, or if you have side effects to your psychiatric medications, call your outpatient psychiatric provider, 911, [phone] or go to the nearest emergency department.   -If suicidal thoughts recur, call your outpatient psychiatric provider, 911, [phone] or go to the nearest emergency department.  Signed: Armando Reichert, MD 03/21/2021, 5:20 PM
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
## 2021-03-21 NOTE — Progress Notes (Signed)
Pt states that he had diarrhea at approximately 0015 and then once again at about 0045. The patient is now requesting something for pain (belly ache). ?

## 2021-03-21 NOTE — Progress Notes (Signed)
Patient ID: Dylan [name], male   DOB: Feb 10, 1965, 56 y.o.   MRN: [id number] ? ? ?Pt ambulatory, alert, and oriented X4 on and off the unit. Education, support, and encouragement provided. Discharge summary/AVS, prescriptions, medications, and follow up appointments reviewed with pt and a copy of the AVS was given to pt. Suicide prevention resources provided. Pt's belongings in locker #7 returned and belongings sheet signed. Pt denies SI/HI, AVH, pain, or any concerns at this time. Pt discharged to lobby to be transported to his destination via [place]. ?

## 2021-03-21 NOTE — Progress Notes (Signed)
?  [facility] Adult Case Management Discharge Plan : ? ?Will you be returning to the same living situation after discharge:  Yes,  Patient to return to [place] sober living environment ?At discharge, do you have transportation home?: Yes,  Patient to return home via [facility].    ?Do you have the ability to pay for your medications: Yes,  [place] Medicaid ? ?Release of information consent forms completed and in the chart;  Patient's signature needed at discharge. ? ?Patient to Follow up at: ? Follow-up Information   ? ? [facility] Follow up.   ?Specialty: Hospice and Palliative Medicine ?Why: Please call personally to schedule an appointment with this provider for grief/bereavement therapy services. ?Contact information: ?[address] ?[place] ?[phone] ? ?  ?  ? ? [place]. Go on 04/28/2021.   ?Specialty: Behavioral Health ?Why: You have an appointment for medication management services on 04/28/21 at 2:30 pm. You also have an appointment for therapy on 05/17/21 at 1:00 pm; (These appointments will be held in person).  * If you need to be seen sooner, please go to this provider during walk in hours:  Monday through Wednesday, from 7:30 am to 10:30 am. ?Contact information: ?[address] ?[place] [address] ?[phone] ? ?  ?  ? ?  ?  ? ?  ? ?Next level of care provider has access to [place] ? ?Safety Planning and Suicide Prevention discussed: No. SPE completed with patient, declined consent for CSW team to contact collateral.  ?  ?Has patient been referred to the Quitline?: Yes, faxed on 03/21/21 ? ?Patient has been referred for addiction treatment: Yes Patient to return to sober living environment w/ outpatient substance use resources.  ? ?Durenda Hurt, LCSWA ?03/21/2021, 10:03 AM ?

## 2021-03-21 NOTE — Progress Notes (Signed)
MHT alerted this writer than Pt had vomited and had diarrhea again.  Pt has order for zofran and bentyl so these were taken to Pt as soon as he came out of bathroom.  Pt states "I feel so weak".  Pt taken a fresh pticher of water as well as a cup of ice.  Pt instructed to sit down on floor and/or call for assistance if he feels like  he is going to fall when getting up for the bathroom. NP notified, will continue to monitor closely.   ?

## 2021-03-21 NOTE — Progress Notes (Signed)
Patient just vomited twice in his restroom.  ?

## 2021-03-23 ENCOUNTER — Other Ambulatory Visit: Payer: Self-pay

## 2021-03-23 ENCOUNTER — Encounter: Payer: Self-pay | Admitting: Family Medicine

## 2021-03-23 ENCOUNTER — Ambulatory Visit: Payer: Medicaid Other | Attending: Family Medicine | Admitting: Family Medicine

## 2021-03-23 VITALS — BP 146/89 | HR 73 | Ht 74.0 in | Wt 236.4 lb

## 2021-03-23 DIAGNOSIS — R7989 Other specified abnormal findings of blood chemistry: Secondary | ICD-10-CM | POA: Diagnosis not present

## 2021-03-23 DIAGNOSIS — R935 Abnormal findings on diagnostic imaging of other abdominal regions, including retroperitoneum: Secondary | ICD-10-CM | POA: Insufficient documentation

## 2021-03-23 DIAGNOSIS — I10 Essential (primary) hypertension: Secondary | ICD-10-CM | POA: Diagnosis not present

## 2021-03-23 DIAGNOSIS — F1910 Other psychoactive substance abuse, uncomplicated: Secondary | ICD-10-CM | POA: Insufficient documentation

## 2021-03-23 DIAGNOSIS — Z9151 Personal history of suicidal behavior: Secondary | ICD-10-CM | POA: Insufficient documentation

## 2021-03-23 DIAGNOSIS — L603 Nail dystrophy: Secondary | ICD-10-CM | POA: Insufficient documentation

## 2021-03-23 DIAGNOSIS — E782 Mixed hyperlipidemia: Secondary | ICD-10-CM | POA: Insufficient documentation

## 2021-03-23 DIAGNOSIS — Z21 Asymptomatic human immunodeficiency virus [HIV] infection status: Secondary | ICD-10-CM | POA: Insufficient documentation

## 2021-03-23 DIAGNOSIS — Z79899 Other long term (current) drug therapy: Secondary | ICD-10-CM | POA: Insufficient documentation

## 2021-03-23 DIAGNOSIS — F250 Schizoaffective disorder, bipolar type: Secondary | ICD-10-CM | POA: Diagnosis not present

## 2021-03-23 DIAGNOSIS — G8929 Other chronic pain: Secondary | ICD-10-CM | POA: Diagnosis not present

## 2021-03-23 DIAGNOSIS — E785 Hyperlipidemia, unspecified: Secondary | ICD-10-CM | POA: Diagnosis not present

## 2021-03-23 DIAGNOSIS — M25562 Pain in left knee: Secondary | ICD-10-CM | POA: Insufficient documentation

## 2021-03-23 NOTE — Progress Notes (Signed)
? ?Subjective:  ?Patient ID: Dylan [name], male    DOB: 10-18-1965  Age: 56 y.o. MRN: [id number] ? ?CC: Cyst ? ? ?HPI ?Dylan [name] is a 56 y.o. year old male with a history of HIV (on antiretroviral therapy), hypertension, schizoaffective disorder, substance abuse, chronic left knee pain, right second metatarsal fracture status post right foot fusion in 09/2019, hyperlipidemia. ?He was hospitalized last week for suicidal ideation.  Of note last year he also had a hospitalization for same.  He has had previous suicidal attempt with overdose per hospital notes. ?His Cymbalta was held due to elevated LFTs and he was stabilized and subsequently discharged with an appointment follow-up with [facility] behavioral health on 04/28/2021. ? ?Interval History: ?He feels good today but is concerned about abnormal MRI findings which he has had.  He has no abdominal pain or other GI symptoms. ?MRI abdomen and pelvis revealed from 02/2021: ?IMPRESSION: ?The soft tissue structure anterior to the caudate lobe of the liver ?appears to be a nonspecific mildly prominent lymph node. No ?suspicious mass identified in the liver or pancreas. Consider ?follow-up CT abdomen in 6 months to ensure stability. ? ?He states his statin was also discontinued during hospitalization and he is currently not on any medication for cholesterol. ?Past Medical History:  ?Diagnosis Date  ? Anxiety   ? Arthritis   ? Bipolar 1 disorder ([place])   ? Colon polyps   ? Depression   ? GERD (gastroesophageal reflux disease)   ? Hepatitis B   ? Human immunodeficiency virus (HIV) ([place])   ? Hyperlipidemia   ? Hypertension   ? Neuromuscular disorder ([place])   ? neuropathy  ? Neuropathy   ? Pre-diabetes   ? Schizophrenia ([place])   ? ? ?Past Surgical History:  ?Procedure Laterality Date  ? FOOT ARTHRODESIS Right 09/17/2019  ? Procedure: FUSION RIGHT LISFRANC JOINT;  Surgeon: Newt Minion, MD;  Location: [facility];  Service: Orthopedics;  Laterality: Right;  ? FOOT ARTHRODESIS  Right 09/2019  ? HEMORROIDECTOMY    ? IR FLUORO GUIDE CV LINE RIGHT  02/22/2017  ? IR US GUIDE VASC ACCESS RIGHT  02/22/2017  ? TUMOR REMOVAL    ? From Chest  ? ? ?Family History  ?Problem Relation Age of Onset  ? CAD Mother   ? Diabetes Father   ? Colon cancer Maternal Aunt 50  ? Diabetes Maternal Aunt   ? Heart disease Maternal Uncle   ? Esophageal cancer Neg Hx   ? Rectal cancer Neg Hx   ? Stomach cancer Neg Hx   ? ? ?Social History  ? ?Socioeconomic History  ? Marital status: Married  ?  Spouse name: Not on file  ? Number of children: 1  ? Years of education: Not on file  ? Highest education level: Not on file  ?Occupational History  ? Not on file  ?Tobacco Use  ? Smoking status: Every Day  ?  Packs/day: 0.50  ?  Years: 25.00  ?  Pack years: 12.50  ?  Types: E-cigarettes, Cigarettes  ? Smokeless tobacco: Never  ? Tobacco comments:  ?  has patches when he is ready to quit  ?Vaping Use  ? Vaping Use: Every day  ? Start date: 12/09/2020  ? Substances: Nicotine  ?Substance and Sexual Activity  ? Alcohol use: Yes  ?  Comment: occassional drinker  ? Drug use: Yes  ?  Types: Cocaine  ?  Comment: last use was Friday 03/05/2020  ? Sexual activity:  Yes  ?  Partners: Female  ?  Comment: condoms given  ?Other Topics Concern  ? Not on file  ?Social History Narrative  ? Not on file  ? ?Social Determinants of Health  ? ?Financial Resource Strain: Not on file  ?Food Insecurity: Not on file  ?Transportation Needs: Not on file  ?Physical Activity: Not on file  ?Stress: Not on file  ?Social Connections: Not on file  ? ? ?Allergies  ?Allergen Reactions  ? Penicillins Other (See Comments)  ?  Childhood allergy  ? Latex Rash  ? Tape Rash  ? ? ?Outpatient Medications Prior to Visit  ?Medication Sig Dispense Refill  ? bictegravir-emtricitabine-tenofovir AF (BIKTARVY) 50-200-25 MG TABS tablet Take 1 tablet by mouth daily. 30 tablet 0  ? gabapentin (NEURONTIN) 300 MG capsule Take 1 capsule (300 mg total) by mouth 3 (three) times daily. 90  capsule 0  ? hydrOXYzine (ATARAX) 25 MG tablet Take 1 tablet (25 mg total) by mouth 3 (three) times daily as needed for anxiety. 30 tablet 0  ? nicotine (NICODERM CQ - DOSED IN MG/24 HOURS) 14 mg/24hr patch Place 1 patch (14 mg total) onto the skin daily. 28 patch 0  ? prazosin (MINIPRESS) 1 MG capsule Take 1 capsule (1 mg total) by mouth at bedtime. 30 capsule 0  ? QUEtiapine (SEROQUEL XR) 50 MG TB24 24 hr tablet Take 1 tablet (50 mg total) by mouth daily. 30 tablet 0  ? QUEtiapine (SEROQUEL) 200 MG tablet Take 1 tablet (200 mg total) by mouth at bedtime. 30 tablet 0  ? traZODone (DESYREL) 100 MG tablet Take 1 tablet (100 mg total) by mouth at bedtime. 30 tablet 0  ? ?No facility-administered medications prior to visit.  ? ? ? ?ROS ?Review of Systems  ?Constitutional:  Negative for activity change and appetite change.  ?HENT:  Negative for sinus pressure and sore throat.   ?Eyes:  Negative for visual disturbance.  ?Respiratory:  Negative for cough, chest tightness and shortness of breath.   ?Cardiovascular:  Negative for chest pain and leg swelling.  ?Gastrointestinal:  Negative for abdominal distention, abdominal pain, constipation and diarrhea.  ?Endocrine: Negative.   ?Genitourinary:  Negative for dysuria.  ?Musculoskeletal:  Negative for joint swelling and myalgias.  ?Skin:  Negative for rash.  ?Allergic/Immunologic: Negative.   ?Neurological:  Negative for weakness, light-headedness and numbness.  ?Psychiatric/Behavioral:  Negative for dysphoric mood and suicidal ideas.   ? ?Objective:  ?BP (!) 146/89   Pulse 73   Ht 6\' 2"  (1.88 m)   Wt 236 lb 6.4 oz (107.2 kg)   SpO2 98%   BMI 30.35 kg/m?  ? ?BP/Weight 03/23/2021 03/20/2021 03/15/2021  ?Systolic BP 497 026 378  ?Diastolic BP 89 85 67  ?Wt. (Lbs) 236.4 - 232  ?BMI 30.35 - 29.79  ? ? ? ? ?Physical Exam ?Constitutional:   ?   Appearance: He is well-developed.  ?Cardiovascular:  ?   Rate and Rhythm: Normal rate.  ?   Heart sounds: Normal heart sounds. No murmur  heard. ?Pulmonary:  ?   Effort: Pulmonary effort is normal.  ?   Breath sounds: Normal breath sounds. No wheezing or rales.  ?Chest:  ?   Chest wall: No tenderness.  ?Abdominal:  ?   General: Bowel sounds are normal. There is no distension.  ?   Palpations: Abdomen is soft. There is no mass.  ?   Tenderness: There is no abdominal tenderness.  ?Musculoskeletal:     ?  General: Normal range of motion.  ?   Right lower leg: No edema.  ?   Left lower leg: No edema.  ?Neurological:  ?   Mental Status: He is alert and oriented to person, place, and time.  ?Psychiatric:     ?   Mood and Affect: Mood normal.  ? ? ?CMP Latest Ref Rng & Units 03/19/2021 03/17/2021 03/13/2021  ?Glucose 70 - 99 mg/dL 103(H) - 77  ?BUN 6 - 20 mg/dL 15 - 28(H)  ?Creatinine 0.61 - 1.24 mg/dL 1.12 - 1.07  ?Sodium 135 - 145 mmol/L 137 - 135  ?Potassium 3.5 - 5.1 mmol/L 4.4 - 4.3  ?Chloride 98 - 111 mmol/L 104 - 99  ?CO2 22 - 32 mmol/L 24 - 25  ?Calcium 8.9 - 10.3 mg/dL 9.2 - 10.1  ?Total Protein 6.5 - 8.1 g/dL 7.3 7.7 8.4(H)  ?Total Bilirubin 0.3 - 1.2 mg/dL 0.4 0.3 0.7  ?Alkaline Phos 38 - 126 U/L 78 90 103  ?AST 15 - 41 U/L 45(H) 81(H) 409(H)  ?ALT 0 - 44 U/L 79(H) 104(H) 101(H)  ? ? ?Lipid Panel  ?   ?Component Value Date/Time  ? CHOL 189 03/17/2021 1834  ? CHOL 158 02/13/2019 1128  ? TRIG 349 (H) 03/17/2021 1834  ? HDL 44 03/17/2021 1834  ? HDL 41 02/13/2019 1128  ? CHOLHDL 4.3 03/17/2021 1834  ? VLDL 70 (H) 03/17/2021 1834  ? [place] 75 03/17/2021 1834  ? [place] 97 02/13/2019 1128  ? LDLCALC 149 (H) 06/06/2018 1406  ? ? ?CBC ?   ?Component Value Date/Time  ? WBC 9.3 03/19/2021 0905  ? RBC 4.79 03/19/2021 0905  ? HGB 14.6 03/19/2021 0905  ? HCT 43.6 03/19/2021 0905  ? PLT 174 03/19/2021 0905  ? MCV 91.0 03/19/2021 0905  ? MCH 30.5 03/19/2021 0905  ? MCHC 33.5 03/19/2021 0905  ? RDW 13.2 03/19/2021 0905  ? LYMPHSABS 1.0 03/19/2021 0905  ? MONOABS 0.5 03/19/2021 0905  ? EOSABS 0.1 03/19/2021 0905  ? BASOSABS 0.0 03/19/2021 0905  ? ? ?Lab Results   ?Component Value Date  ? HGBA1C 5.7 (H) 03/13/2021  ? ? ?The 10-year ASCVD risk score (Arnett DK, et al., 2019) is: 14.4% ?  Values used to calculate the score: ?    Age: 71 years ?    Sex: Male ?    Is Non-Hisp

## 2021-03-30 ENCOUNTER — Telehealth (HOSPITAL_COMMUNITY): Payer: Self-pay | Admitting: Family Medicine

## 2021-03-30 NOTE — BH Assessment (Signed)
Care Management - Follow Up [place] Discharges  ? ?Patient has been placed in an inpatient psychiatric hospital ([facility]) on 03-15-21 ?

## 2021-04-02 ENCOUNTER — Encounter: Payer: Self-pay | Admitting: Internal Medicine

## 2021-04-04 ENCOUNTER — Other Ambulatory Visit: Payer: Self-pay

## 2021-04-04 ENCOUNTER — Ambulatory Visit: Payer: Medicaid Other | Admitting: Gastroenterology

## 2021-04-04 MED ORDER — BIKTARVY 50-200-25 MG PO TABS: 1.0000 | ORAL_TABLET | Freq: Every day | ORAL | 5 refills | Status: DC

## 2021-04-04 NOTE — Progress Notes (Deleted)
Review of pertinent gastrointestinal problems: ?1.  Routine risk for colon cancer.  Colonoscopy 11/2018 Dr. Ardis Hughs found no polyps.  Aunt with colon cancer.  Several colonoscopies prior to 2020 at outside facilities and he believes polyps were removed.  We tried several times to get those records without success.  Recall colonoscopy 11/2028 ?

## 2021-04-12 ENCOUNTER — Ambulatory Visit (INDEPENDENT_AMBULATORY_CARE_PROVIDER_SITE_OTHER): Payer: Medicaid Other | Admitting: Podiatry

## 2021-04-12 DIAGNOSIS — B351 Tinea unguium: Secondary | ICD-10-CM | POA: Diagnosis not present

## 2021-04-12 MED ORDER — CICLOPIROX 8 % EX SOLN: Freq: Every day | CUTANEOUS | 2 refills | Status: DC

## 2021-04-12 NOTE — Patient Instructions (Signed)
Ciclopirox nail solution ?What is this medication? ?CICLOPIROX (sye kloe PEER ox) NAIL SOLUTION is an antifungal medicine. It used to treat fungal infections of the nails. ?This medicine may be used for other purposes; ask your health care provider or pharmacist if you have questions. ?COMMON BRAND NAME(S): Ciclodan Nail Solution, CNL8, Penlac ?What should I tell my care team before I take this medication? ?They need to know if you have any of these conditions: ?diabetes mellitus ?history of seizures ?HIV infection ?immune system problems or organ transplant ?large areas of burned or damaged skin ?peripheral vascular disease or poor circulation ?taking corticosteroid medication (including steroid inhalers, cream, or lotion) ?an unusual or allergic reaction to ciclopirox, isopropyl alcohol, other medicines, foods, dyes, or preservatives ?pregnant or trying to get pregnant ?breast-feeding ?How should I use this medication? ?This medicine is for external use only. Follow the directions that come with this medicine exactly. Wash and dry your hands before use. Avoid contact with the eyes, mouth or nose. If you do get this medicine in your eyes, rinse out with plenty of cool tap water. Contact your doctor or health care professional if eye irritation occurs. Use at regular intervals. Do not use your medicine more often than directed. Finish the full course prescribed by your doctor or health care professional even if you think you are better. Do not stop using except on your doctor's advice. ?Talk to your pediatrician regarding the use of this medicine in children. While this medicine may be prescribed for children as young as 12 years for selected conditions, precautions do apply. ?Overdosage: If you think you have taken too much of this medicine contact a poison control center or emergency room at once. ?NOTE: This medicine is only for you. Do not share this medicine with others. ?What if I miss a dose? ?If you miss a  dose, use it as soon as you can. If it is almost time for your next dose, use only that dose. Do not use double or extra doses. ?What may interact with this medication? ?Interactions are not expected. Do not use any other skin products without telling your doctor or health care professional. ?This list may not describe all possible interactions. Give your health care provider a list of all the medicines, herbs, non-prescription drugs, or dietary supplements you use. Also tell them if you smoke, drink alcohol, or use illegal drugs. Some items may interact with your medicine. ?What should I watch for while using this medication? ?Tell your doctor or health care professional if your symptoms get worse. Four to six months of treatment may be needed for the nail(s) to improve. Some people may not achieve a complete cure or clearing of the nails by this time. ?Tell your doctor or health care professional if you develop sores or blisters that do not heal properly. If your nail infection returns after stopping using this product, contact your doctor or health care professional. ?What side effects may I notice from receiving this medication? ?Side effects that you should report to your doctor or health care professional as soon as possible: ?allergic reactions like skin rash, itching or hives, swelling of the face, lips, or tongue ?severe irritation, redness, burning, blistering, peeling, swelling, oozing ?Side effects that usually do not require medical attention (report to your doctor or health care professional if they continue or are bothersome): ?mild reddening of the skin ?nail discoloration ?temporary burning or mild stinging at the site of application ?This list may not describe all possible  side effects. Call your doctor for medical advice about side effects. You may report side effects to FDA at 1-800-FDA-1088. ?Where should I keep my medication? ?Keep out of the reach of children. ?Store at room temperature  between 15 and 30 degrees C (59 and 86 degrees F). Do not freeze. Protect from light by storing the bottle in the carton after every use. This medicine is flammable. Keep away from heat and flame. Throw away any unused medicine after the expiration date. ?NOTE: This sheet is a summary. It may not cover all possible information. If you have questions about this medicine, talk to your doctor, pharmacist, or health care provider. ?? 2022 Elsevier/Gold Standard (2007-05-15 00:00:00) ? ?

## 2021-04-19 DIAGNOSIS — B351 Tinea unguium: Secondary | ICD-10-CM | POA: Insufficient documentation

## 2021-04-19 NOTE — Progress Notes (Signed)
Subjective:  ? ?Patient ID: Dylan [name], male   DOB: 56 y.o.   MRN: [id number]  ? ?HPI ?56 year old male presents the office today for concerns of nail issues.  He states that his right foot nails 3, 4, 5 are becoming thickened elongated and he is having difficulty trimming them given the thickening and elongation.  No swelling redness or any drainage to the toenail sites.  Does have a history of a Lisfranc injury on the right foot.  He has been wearing Dr. Felicie Morn over-the-counter inserts.  Asked what other inserts to wear as well. ? ? ?Review of Systems  ?All other systems reviewed and are negative. ? ?Past Medical History:  ?Diagnosis Date  ? Anxiety   ? Arthritis   ? Bipolar 1 disorder ([place])   ? Colon polyps   ? Depression   ? GERD (gastroesophageal reflux disease)   ? Hepatitis B   ? Human immunodeficiency virus (HIV) ([place])   ? Hyperlipidemia   ? Hypertension   ? Neuromuscular disorder ([place])   ? neuropathy  ? Neuropathy   ? Pre-diabetes   ? Schizophrenia ([place])   ? ? ?Past Surgical History:  ?Procedure Laterality Date  ? FOOT ARTHRODESIS Right 09/17/2019  ? Procedure: FUSION RIGHT LISFRANC JOINT;  Surgeon: Newt Minion, MD;  Location: [facility];  Service: Orthopedics;  Laterality: Right;  ? FOOT ARTHRODESIS Right 09/2019  ? HEMORROIDECTOMY    ? IR FLUORO GUIDE CV LINE RIGHT  02/22/2017  ? IR US GUIDE VASC ACCESS RIGHT  02/22/2017  ? TUMOR REMOVAL    ? From Chest  ? ? ? ?Current Outpatient Medications:  ?  ciclopirox (PENLAC) 8 % solution, Apply topically at bedtime. Apply over nail and surrounding skin. Apply daily over previous coat. After seven (7) days, may remove with alcohol and continue cycle., Disp: 6.6 mL, Rfl: 2 ?  bictegravir-emtricitabine-tenofovir AF (BIKTARVY) 50-200-25 MG TABS tablet, Take 1 tablet by mouth daily., Disp: 30 tablet, Rfl: 5 ?  gabapentin (NEURONTIN) 300 MG capsule, Take 1 capsule (300 mg total) by mouth 3 (three) times daily., Disp: 90 capsule, Rfl: 0 ?  hydrOXYzine (ATARAX) 25 MG  tablet, Take 1 tablet (25 mg total) by mouth 3 (three) times daily as needed for anxiety., Disp: 30 tablet, Rfl: 0 ?  nicotine (NICODERM CQ - DOSED IN MG/24 HOURS) 14 mg/24hr patch, Place 1 patch (14 mg total) onto the skin daily., Disp: 28 patch, Rfl: 0 ?  prazosin (MINIPRESS) 1 MG capsule, Take 1 capsule (1 mg total) by mouth at bedtime., Disp: 30 capsule, Rfl: 0 ?  QUEtiapine (SEROQUEL XR) 50 MG TB24 24 hr tablet, Take 1 tablet (50 mg total) by mouth daily., Disp: 30 tablet, Rfl: 0 ?  QUEtiapine (SEROQUEL) 200 MG tablet, Take 1 tablet (200 mg total) by mouth at bedtime., Disp: 30 tablet, Rfl: 0 ?  traZODone (DESYREL) 100 MG tablet, Take 1 tablet (100 mg total) by mouth at bedtime., Disp: 30 tablet, Rfl: 0 ? ?Allergies  ?Allergen Reactions  ? Penicillins Other (See Comments)  ?  Childhood allergy  ? Latex Rash  ? Tape Rash  ? ? ? ? ? ?   ?Objective:  ?Physical Exam  ?General: AAO x3, NAD ? ?Dermatological: Nails are hypertrophic, dystrophic with yellow, brown discoloration.  Most notably on the right third through fifth nails of all the nails are discolored and dystrophic.  No open lesions. ? ?Vascular: Dorsalis Pedis artery and Posterior Tibial artery pedal pulses are 2/4 bilateral  with immedate capillary fill time. Pedal hair growth present. No varicosities and no lower extremity edema present bilateral. There is no pain with calf compression, swelling, warmth, erythema.  ? ?Neruologic: Grossly intact via light touch bilateral.  ? ?Musculoskeletal: Deformity noted along the right Lisfranc joint but no significant discomfort noted on this area.. Muscular strength 5/5 in all groups tested bilateral. ? ?Gait: Unassisted, Nonantalgic.  ? ? ?   ?Assessment:  ? ?Onychomycosis, history of Lisfranc Injury ? ?   ?Plan:  ?-Treatment options discussed including all alternatives, risks, and complications ?-Etiology of symptoms were discussed ?-Courtesy debride the nails with any complications or bleeding.  Discussed oral,  topical treatments.  Prescribed Penlac.   ?-Discussed various inserts including super feet, power steps, aetrex.  Also discussed supportive shoe gear. ? ?Trula Slade DPM ? ?   ? ?

## 2021-04-26 ENCOUNTER — Ambulatory Visit: Payer: Medicaid Other | Attending: Family Medicine

## 2021-04-26 ENCOUNTER — Other Ambulatory Visit: Payer: Medicaid Other

## 2021-04-26 DIAGNOSIS — R7989 Other specified abnormal findings of blood chemistry: Secondary | ICD-10-CM

## 2021-04-28 ENCOUNTER — Ambulatory Visit (INDEPENDENT_AMBULATORY_CARE_PROVIDER_SITE_OTHER): Payer: Medicaid Other | Admitting: Student in an Organized Health Care Education/Training Program

## 2021-04-28 ENCOUNTER — Encounter (HOSPITAL_COMMUNITY): Payer: Self-pay | Admitting: Student in an Organized Health Care Education/Training Program

## 2021-04-28 VITALS — BP 125/84 | HR 98 | Ht 74.0 in | Wt 232.0 lb

## 2021-04-28 DIAGNOSIS — F99 Mental disorder, not otherwise specified: Secondary | ICD-10-CM | POA: Diagnosis not present

## 2021-04-28 DIAGNOSIS — F5105 Insomnia due to other mental disorder: Secondary | ICD-10-CM | POA: Diagnosis not present

## 2021-04-28 DIAGNOSIS — F250 Schizoaffective disorder, bipolar type: Secondary | ICD-10-CM | POA: Diagnosis not present

## 2021-04-28 DIAGNOSIS — F411 Generalized anxiety disorder: Secondary | ICD-10-CM | POA: Diagnosis not present

## 2021-04-28 MED ORDER — HYDROXYZINE HCL 25 MG PO TABS: 25.0000 mg | ORAL_TABLET | Freq: Three times a day (TID) | ORAL | 0 refills | Status: DC | PRN

## 2021-04-28 MED ORDER — GABAPENTIN 300 MG PO CAPS: 300.0000 mg | ORAL_CAPSULE | Freq: Three times a day (TID) | ORAL | 0 refills | Status: DC

## 2021-04-28 MED ORDER — QUETIAPINE FUMARATE ER 50 MG PO TB24: 50.0000 mg | ORAL_TABLET | Freq: Every day | ORAL | 0 refills | Status: DC

## 2021-04-28 MED ORDER — QUETIAPINE FUMARATE 200 MG PO TABS: 200.0000 mg | ORAL_TABLET | Freq: Every day | ORAL | 0 refills | Status: DC

## 2021-04-28 MED ORDER — TRAZODONE HCL 100 MG PO TABS: 100.0000 mg | ORAL_TABLET | Freq: Every day | ORAL | 0 refills | Status: DC

## 2021-04-28 NOTE — Progress Notes (Addendum)
Psychiatric Initial Adult Assessment  ? ?Patient Identification: Dylan [name] ?MRN:  [id number] ?Date of Evaluation:  04/28/2021 ?Referral Source: [facility] ?Chief Complaint:   ?Chief Complaint  ?Patient presents with  ? New Patient (Initial Visit)  ?  MED MGMT  ? ?Visit Diagnosis:  ?  ICD-10-CM   ?1. Schizoaffective disorder, bipolar type ([place])  F25.0 QUEtiapine (SEROQUEL XR) 50 MG TB24 24 hr tablet  ?  QUEtiapine (SEROQUEL) 200 MG tablet  ?  ?2. Generalized anxiety disorder  F41.1 gabapentin (NEURONTIN) 300 MG capsule  ?  hydrOXYzine (ATARAX) 25 MG tablet  ?  ?3. Insomnia due to other mental disorder  F51.05 traZODone (DESYREL) 100 MG tablet  ? F99   ?  ? ? ?History of Present Illness:   ?Deral Schellenberg is a 56 yr old male who presents to establish care after discharge from [facility] and medication management.  PPHx is significant for Schizoaffective Disorder, Bipolar type, GAD, Insomnia, Polysubstance use (Cocaine, EtOH), multiple suicide attempts (OD, held gun to his head, plan to jump off a parking deck), and multiple hospitalizations (latest [facility] 3/23). ? ?He reports that since his hospitalization he has been doing better.  He reports that prior to his hospitalization he had done cocaine and drank EtOH.  He reports that his medications have been doing well for him.  He reports that he broke up with his girlfriend because she had been saying that just one drink would be fine and he states he knows that it always starts with a drink and it goes bad from there.  He reports not seeing things for a long time.  He reports occasionally hearing things when he gets stuck in his own head and over thinks things. ? ?He reports a history of Schizoaffective disorder, Bipolar type, anxiety, and depression.  He reports substance abuse in the past of EtOH and Cocaine but reports no use since his last hospitalization.  He reports multiple hospitalizations the most recent being [facility] 3/23.  He reports multiple suicide attempts (OD,  held gun to his head, plan to jump off a parking deck).  He reports a PMH significant for HIV ? ?He reports that he has not drank EtOH since his hospitalization.  He reports no substance use since his hospitalization.  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  He report currently attending NA meetings and having a sponsor.  He currently lives at an [place] and that it has been good for him. ? ?Discussed with him that since he has been doing well with his current medications we will keep them where they currently are.  He reports not taking the Prazosin any more because he is no longer having any nightmares. ? ?He reports no SI, HI, or AVH.  He reports his appetite is doing good and his sleep is doing good.  Discussed with him what to do in the event of a future crisis.  Discussed that he can return to [facility], go to the [facility], go to the nearest ED, or call [phone].   He reported understanding and had no concerns. ? ? ?Associated Signs/Symptoms: ?Depression Symptoms:   Reports no symptoms currently ?(Hypo) Manic Symptoms:   Reports no symptoms currently ?Anxiety Symptoms:   Reports no symptoms currently ?Psychotic Symptoms:   Reports no symptoms currently ?PTSD Symptoms: ?Negative ? ?Past Psychiatric History: Schizoaffective Disorder, Bipolar type, GAD, Insomnia, Polysubstance use (Cocaine, EtOH), multiple suicide attempts (OD, held  gun to his head, plan to jump off a parking deck), and multiple hospitalizations (latest [facility] 3/23). ? ?Previous Psychotropic Medications: Yes  ? ?Substance Abuse History in the last 12 months:  Yes.   ? ?Consequences of Substance Abuse: ?Medical Consequences:  Inpatient Psych hospitalization ? ?Past Medical History:  ?Past Medical History:  ?Diagnosis Date  ? Anxiety   ? Arthritis   ? Bipolar 1 disorder ([place])   ? Colon polyps   ? Depression   ? GERD (gastroesophageal reflux disease)   ? Hepatitis B   ? Human immunodeficiency  virus (HIV) ([place])   ? Hyperlipidemia   ? Hypertension   ? Neuromuscular disorder ([place])   ? neuropathy  ? Neuropathy   ? Pre-diabetes   ? Schizophrenia ([place])   ?  ?Past Surgical History:  ?Procedure Laterality Date  ? FOOT ARTHRODESIS Right 09/17/2019  ? Procedure: FUSION RIGHT LISFRANC JOINT;  Surgeon: Newt Minion, MD;  Location: [facility];  Service: Orthopedics;  Laterality: Right;  ? FOOT ARTHRODESIS Right 09/2019  ? HEMORROIDECTOMY    ? IR FLUORO GUIDE CV LINE RIGHT  02/22/2017  ? IR US GUIDE VASC ACCESS RIGHT  02/22/2017  ? TUMOR REMOVAL    ? From Chest  ? ? ?Family Psychiatric History: Mother: Schizophrenic (undiagnosed) ?Maternal Uncle: Schizophrenic  ?Uncles and brother: Substance use ? ?Family History:  ?Family History  ?Problem Relation Age of Onset  ? CAD Mother   ? Diabetes Father   ? Colon cancer Maternal Aunt 40  ? Diabetes Maternal Aunt   ? Heart disease Maternal Uncle   ? Esophageal cancer Neg Hx   ? Rectal cancer Neg Hx   ? Stomach cancer Neg Hx   ? ? ?Social History:   ?Social History  ? ?Socioeconomic History  ? Marital status: Married  ?  Spouse name: Not on file  ? Number of children: 1  ? Years of education: Not on file  ? Highest education level: Not on file  ?Occupational History  ? Not on file  ?Tobacco Use  ? Smoking status: Every Day  ?  Packs/day: 0.50  ?  Years: 25.00  ?  Pack years: 12.50  ?  Types: E-cigarettes, Cigarettes  ? Smokeless tobacco: Never  ? Tobacco comments:  ?  has patches when he is ready to quit  ?Vaping Use  ? Vaping Use: Every day  ? Start date: 12/09/2020  ? Substances: Nicotine  ?Substance and Sexual Activity  ? Alcohol use: Yes  ?  Comment: occassional drinker  ? Drug use: Yes  ?  Types: Cocaine  ?  Comment: last use was Friday 03/05/2020  ? Sexual activity: Yes  ?  Partners: Female  ?  Comment: condoms given  ?Other Topics Concern  ? Not on file  ?Social History Narrative  ? Not on file  ? ?Social Determinants of Health  ? ?Financial Resource Strain: Not on file  ?Food  Insecurity: Not on file  ?Transportation Needs: Not on file  ?Physical Activity: Not on file  ?Stress: Not on file  ?Social Connections: Not on file  ? ? ?Additional Social History: Lives in an [place].  Works as a Astronomer at [employer].  ? ?Allergies:   ?Allergies  ?Allergen Reactions  ? Penicillins Other (See Comments)  ?  Childhood allergy  ? Latex Rash  ? Tape Rash  ? ? ?Metabolic Disorder Labs: ?Lab Results  ?Component Value Date  ? HGBA1C 5.7 (H) 03/13/2021  ?  MPG 116.89 03/13/2021  ? MPG 125.5 10/26/2020  ? ?Lab Results  ?Component Value Date  ? PROLACTIN 12.1 03/08/2020  ? PROLACTIN 2.6 (L) 11/09/2019  ? ?Lab Results  ?Component Value Date  ? CHOL 189 03/17/2021  ? TRIG 349 (H) 03/17/2021  ? HDL 44 03/17/2021  ? CHOLHDL 4.3 03/17/2021  ? VLDL 70 (H) 03/17/2021  ? [place] 75 03/17/2021  ? LDLCALC 128 (H) 03/13/2021  ? ?Lab Results  ?Component Value Date  ? TSH 1.051 03/13/2021  ? ? ?Therapeutic Level Labs: ?No results found for: LITHIUM ?No results found for: CBMZ ?No results found for: VALPROATE ? ?Current Medications: ?Current Outpatient Medications  ?Medication Sig Dispense Refill  ? bictegravir-emtricitabine-tenofovir AF (BIKTARVY) 50-200-25 MG TABS tablet Take 1 tablet by mouth daily. 30 tablet 5  ? ciclopirox (PENLAC) 8 % solution Apply topically at bedtime. Apply over nail and surrounding skin. Apply daily over previous coat. After seven (7) days, may remove with alcohol and continue cycle. 6.6 mL 2  ? nicotine (NICODERM CQ - DOSED IN MG/24 HOURS) 14 mg/24hr patch Place 1 patch (14 mg total) onto the skin daily. 28 patch 0  ? gabapentin (NEURONTIN) 300 MG capsule Take 1 capsule (300 mg total) by mouth 3 (three) times daily. 90 capsule 0  ? hydrOXYzine (ATARAX) 25 MG tablet Take 1 tablet (25 mg total) by mouth 3 (three) times daily as needed for anxiety. 30 tablet 0  ? QUEtiapine (SEROQUEL XR) 50 MG TB24 24 hr tablet Take 1 tablet (50 mg total) by mouth daily. 30 tablet 0  ?  QUEtiapine (SEROQUEL) 200 MG tablet Take 1 tablet (200 mg total) by mouth at bedtime. 30 tablet 0  ? traZODone (DESYREL) 100 MG tablet Take 1 tablet (100 mg total) by mouth at bedtime. 30 tablet 0  ? ?No current fac

## 2021-04-29 ENCOUNTER — Encounter: Payer: Self-pay | Admitting: Family Medicine

## 2021-05-03 LAB — HEPATITIS A ANTIBODY, IGM

## 2021-05-03 LAB — CMP14+EGFR
ALT: 39 IU/L (ref 0–44)
AST: 31 IU/L (ref 0–40)
Albumin/Globulin Ratio: 1.7 (ref 1.2–2.2)
Albumin: 4.8 g/dL (ref 3.8–4.9)
Alkaline Phosphatase: 116 IU/L (ref 44–121)
BUN/Creatinine Ratio: 17 (ref 9–20)
BUN: 17 mg/dL (ref 6–24)
Bilirubin Total: 0.2 mg/dL (ref 0.0–1.2)
CO2: 23 mmol/L (ref 20–29)
Calcium: 9.8 mg/dL (ref 8.7–10.2)
Chloride: 101 mmol/L (ref 96–106)
Creatinine, Ser: 0.99 mg/dL (ref 0.76–1.27)
Globulin, Total: 2.8 g/dL (ref 1.5–4.5)
Glucose: 101 mg/dL — ABNORMAL HIGH (ref 70–99)
Potassium: 4.4 mmol/L (ref 3.5–5.2)
Sodium: 138 mmol/L (ref 134–144)
Total Protein: 7.6 g/dL (ref 6.0–8.5)
eGFR: 90 mL/min/{1.73_m2} (ref >59)

## 2021-05-03 LAB — ALPHA-1-ANTITRYPSIN: A-1 Antitrypsin: 135 mg/dL (ref 101–187)

## 2021-05-03 LAB — GAMMA GT: GGT: 43 IU/L (ref 0–65)

## 2021-05-03 LAB — CERULOPLASMIN: Ceruloplasmin: 21.3 mg/dL (ref 16.0–31.0)

## 2021-05-03 LAB — MITOCHONDRIAL ANTIBODIES: Mitochondrial Ab: <20 Units (ref 0.0–20.0)

## 2021-05-03 LAB — HEPATITIS A ANTIBODY, TOTAL

## 2021-05-03 LAB — HEPATITIS B SURFACE ANTIGEN

## 2021-05-04 ENCOUNTER — Encounter: Payer: Self-pay | Admitting: Family Medicine

## 2021-05-05 ENCOUNTER — Ambulatory Visit: Payer: Medicaid Other | Admitting: Internal Medicine

## 2021-05-17 ENCOUNTER — Other Ambulatory Visit: Payer: Self-pay | Admitting: Family Medicine

## 2021-05-17 ENCOUNTER — Encounter (HOSPITAL_COMMUNITY): Payer: Self-pay

## 2021-05-17 ENCOUNTER — Ambulatory Visit (HOSPITAL_COMMUNITY): Payer: Medicaid Other | Admitting: Licensed Clinical Social Worker

## 2021-05-17 MED ORDER — ROSUVASTATIN CALCIUM 10 MG PO TABS
10.0000 mg | ORAL_TABLET | Freq: Every day | ORAL | 1 refills | Status: DC
Start: 2021-05-17 — End: 2021-09-02

## 2021-06-13 ENCOUNTER — Telehealth: Payer: Self-pay | Admitting: Orthopedic Surgery

## 2021-06-13 NOTE — Telephone Encounter (Signed)
Called pt 1X and left vm pt has left mychart message need an appt for left knee pain and needing an xray with Dr. Sharol Given

## 2021-06-16 ENCOUNTER — Encounter (HOSPITAL_COMMUNITY): Payer: Medicaid Other | Admitting: Student in an Organized Health Care Education/Training Program

## 2021-06-25 ENCOUNTER — Encounter (HOSPITAL_COMMUNITY): Payer: Self-pay

## 2021-06-25 ENCOUNTER — Ambulatory Visit (HOSPITAL_COMMUNITY)
Admission: EM | Admit: 2021-06-25 | Discharge: 2021-06-25 | Disposition: A | Discharge status: Home / Self Care | Payer: Medicaid Other | Attending: Physician Assistant | Admitting: Physician Assistant

## 2021-06-25 DIAGNOSIS — M79671 Pain in right foot: Secondary | ICD-10-CM

## 2021-06-25 DIAGNOSIS — G8929 Other chronic pain: Secondary | ICD-10-CM | POA: Diagnosis not present

## 2021-06-25 DIAGNOSIS — M25562 Pain in left knee: Secondary | ICD-10-CM

## 2021-06-25 DIAGNOSIS — L97511 Non-pressure chronic ulcer of other part of right foot limited to breakdown of skin: Secondary | ICD-10-CM

## 2021-06-25 MED ORDER — MUPIROCIN 2 % EX OINT: 1.0000 | TOPICAL_OINTMENT | Freq: Every day | CUTANEOUS | 0 refills | Status: DC

## 2021-06-25 MED ORDER — TRAMADOL HCL 50 MG PO TABS: 50.0000 mg | ORAL_TABLET | Freq: Two times a day (BID) | ORAL | 0 refills | Status: AC | PRN

## 2021-06-25 MED ORDER — DOXYCYCLINE HYCLATE 100 MG PO CAPS: 100.0000 mg | ORAL_CAPSULE | Freq: Two times a day (BID) | ORAL | 0 refills | Status: DC

## 2021-06-25 NOTE — Discharge Instructions (Signed)
I think that your knee pain is related to arthritis.  I have called in a couple doses of tramadol which will help with your pain.  I would recommend you take them primarily at night.  These will make you sleepy so do not drive or drink alcohol with taking them.  Follow-up with orthopedic as we discussed.  Continue over-the-counter medications as needed for pain relief.  I believe that your foot pain is related to your gait but also the ulcer that we found on your foot.  Please keep this area clean and apply Bactroban ointment twice daily.  Start doxycycline 100 mg twice daily for 10 days.  Stay out of the sun while on this medication.  I do recommend you follow-up with podiatry; call to schedule an appointment.  Use postop shoe as needed for comfort.  If wound enlarges or you develop any fever, nausea, vomiting, body aches, weakness you need to be seen immediately.

## 2021-06-25 NOTE — ED Provider Notes (Signed)
[place]    CSN: [id number] Arrival date & time: 06/25/21  1316      History   Chief Complaint Chief Complaint  Patient presents with   Knee Pain   Leg Pain    HPI Dylan [name] is a 56 y.o. male.   Patient presents today with several concerns.  He reports a prolonged history of left knee pain that has worsened over the past several months.  He has seen an orthopedist in the past who recommended replacement but reports that he is not at a point where he could seriously consider this procedure.  He reports that pain increases to 10 with certain movements, described as a tightening/sharp pain deep to patella, no aggravating or alleviating factors notified.  He has tried ibuprofen and meloxicam without improvement.  Denies previous surgery involving knee.  He is able to ambulate but finds that his gait is abnormal as result of pain.  In addition, patient reports a prolonged history of right foot pain.  He reports that this has worsened since the knee pain has begun and believes it is partially related to his abnormal gait.  He reports pain is primarily localized to first MTP and into the great toe.  He does have a history of surgical procedure including orthopedic hardware due to previous Lisfranc injury.  He reports that pain is rated 10 on a 0-10 pain scale, described as aching, no aggravating or alleviating factors identified.  He is able to ambulate despite symptoms but it is difficult.    Past Medical History:  Diagnosis Date   Anxiety    Arthritis    Bipolar 1 disorder ([place])    Colon polyps    Depression    GERD (gastroesophageal reflux disease)    Hepatitis B    Human immunodeficiency virus (HIV) ([place])    Hyperlipidemia    Hypertension    Neuromuscular disorder ([place])    neuropathy   Neuropathy    Pre-diabetes    Schizophrenia ([place])     Patient Active Problem List   Diagnosis Date Noted   Onychomycosis 04/19/2021   Cocaine abuse ([place]) 03/16/2021    Bipolar I disorder, most recent episode depressed ([place]) 03/15/2021   Bipolar 1 disorder, depressed ([place]) 03/15/2021   Nodular radiologic density 02/02/2021   Lesion of liver 02/02/2021   Liver disease 02/02/2021   Vitamin D deficiency 01/18/2021   Alcohol abuse 01/17/2021   Methamphetamine abuse ([place]) 01/17/2021   Lesion of pancreas 01/17/2021   Substance induced mood disorder ([place]) 10/26/2020   Fatty liver 03/09/2020   Schizophrenia ([place]) 03/08/2020   Generalized anxiety disorder 02/03/2020   Insomnia due to other mental disorder 02/03/2020   Lisfranc dislocation, right, initial encounter    Prediabetes 07/22/2019   Tobacco abuse 07/10/2018   ESRD needing dialysis ([place]) 03/03/2018   Schizoaffective disorder, bipolar type ([place])    Acute renal failure (ARF) ([place])    Suicidal ideations    Chronic 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 coma ([place])    AKI (acute kidney injury) ([place]) 02/19/2017   Rhabdomyolysis 02/19/2017   Suicidal overdose ([place]) 02/19/2017   Acute hepatitis 02/19/2017   Elevated LFTs    Schizoaffective disorder, depressive type ([place]) 09/05/2011   Degenerative disc disease 08/21/2011   HIV disease ([place]) 08/17/2011    Past Surgical History:  Procedure Laterality Date   FOOT ARTHRODESIS Right 09/17/2019   Procedure: FUSION RIGHT LISFRANC JOINT;  Surgeon: Newt Minion, MD;  Location: [facility];  Service: Orthopedics;  Laterality: Right;   FOOT ARTHRODESIS Right 09/2019   HEMORROIDECTOMY     IR FLUORO GUIDE CV LINE RIGHT  02/22/2017   IR US GUIDE VASC ACCESS RIGHT  02/22/2017   TUMOR REMOVAL     From Chest       Home Medications    Prior to Admission medications   Medication Sig Start Date End Date Taking? Authorizing Provider  doxycycline (VIBRAMYCIN) 100 MG capsule Take 1 capsule (100 mg total) by mouth 2 (two) times daily. 06/25/21  Yes Tyronza Happe, Junie Panning K, PA-C  mupirocin ointment (BACTROBAN) 2 % Apply 1 Application topically daily. 06/25/21  Yes Tanner Vigna,  Dannika Hilgeman K, PA-C  traMADol (ULTRAM) 50 MG tablet Take 1 tablet (50 mg total) by mouth every 12 (twelve) hours as needed for up to 2 days. 06/25/21 06/27/21 Yes Traver Meckes K, PA-C  bictegravir-emtricitabine-tenofovir AF (BIKTARVY) 50-200-25 MG TABS tablet Take 1 tablet by mouth daily. 04/04/21   Carlyle Basques, MD  ciclopirox (PENLAC) 8 % solution Apply topically at bedtime. Apply over nail and surrounding skin. Apply daily over previous coat. After seven (7) days, may remove with alcohol and continue cycle. 04/12/21   Trula Slade, DPM  gabapentin (NEURONTIN) 300 MG capsule Take 1 capsule (300 mg total) by mouth 3 (three) times daily. 04/28/21   Briant Cedar, MD  hydrOXYzine (ATARAX) 25 MG tablet Take 1 tablet (25 mg total) by mouth 3 (three) times daily as needed for anxiety. 04/28/21   Briant Cedar, MD  nicotine (NICODERM CQ - DOSED IN MG/24 HOURS) 14 mg/24hr patch Place 1 patch (14 mg total) onto the skin daily. 03/22/21   Massengill, Ovid Curd, MD  QUEtiapine (SEROQUEL XR) 50 MG TB24 24 hr tablet Take 1 tablet (50 mg total) by mouth daily. 04/28/21   Briant Cedar, MD  QUEtiapine (SEROQUEL) 200 MG tablet Take 1 tablet (200 mg total) by mouth at bedtime. 04/28/21   Briant Cedar, MD  rosuvastatin (CRESTOR) 10 MG tablet Take 1 tablet (10 mg total) by mouth daily. 05/17/21   Charlott Rakes, MD  traZODone (DESYREL) 100 MG tablet Take 1 tablet (100 mg total) by mouth at bedtime. 04/28/21   Briant Cedar, MD    Family History Family History  Problem Relation Age of Onset   CAD Mother    Diabetes Father    Colon cancer Maternal Aunt 60   Diabetes Maternal Aunt    Heart disease Maternal Uncle    Esophageal cancer Neg Hx    Rectal cancer Neg Hx    Stomach cancer Neg Hx     Social History Social History   Tobacco Use   Smoking status: Every Day    Packs/day: 0.50    Years: 25.00    Total pack years: 12.50    Types: E-cigarettes, Cigarettes   Smokeless  tobacco: Never   Tobacco comments:    has patches when he is ready to quit  Vaping Use   Vaping Use: Every day   Start date: 12/09/2020   Substances: Nicotine  Substance Use Topics   Alcohol use: Yes    Comment: occassional drinker   Drug use: Yes    Types: Cocaine    Comment: last use was Friday 03/05/2020     Allergies   Penicillins, Latex, and Tape   Review of Systems Review of Systems  Constitutional:  Positive for activity change. Negative for appetite change, fatigue and fever.  Respiratory:  Negative for cough and shortness  of breath.   Cardiovascular:  Negative for chest pain.  Gastrointestinal:  Negative for abdominal pain, diarrhea, nausea and vomiting.  Musculoskeletal:  Positive for arthralgias, gait problem, joint swelling and myalgias. Negative for back pain.  Skin:  Negative for color change and wound.  Neurological:  Negative for dizziness, weakness, light-headedness, numbness and headaches.     Physical Exam Triage Vital Signs ED Triage Vitals [06/25/21 1351]  Enc Vitals Group     BP 99/71     Pulse Rate 78     Resp 18     Temp 97.6 F (36.4 C)     Temp Source Oral     SpO2 99 %     Weight      Height      Head Circumference      Peak Flow      Pain Score 5     Pain Loc      Pain Edu?      Excl. in [place]?    No data found.  Updated Vital Signs BP 99/71 (BP Location: Left Arm)   Pulse 78   Temp 97.6 F (36.4 C) (Oral)   Resp 18   SpO2 99%   Visual Acuity Right Eye Distance:   Left Eye Distance:   Bilateral Distance:    Right Eye Near:   Left Eye Near:    Bilateral Near:     Physical Exam Vitals reviewed.  Constitutional:      General: He is awake.     Appearance: Normal appearance. He is well-developed. He is not ill-appearing.     Comments: Very pleasant male appears stated age in no acute distress sitting comfortably in exam room  HENT:     Head: Normocephalic and atraumatic.  Cardiovascular:     Rate and Rhythm: Normal rate  and regular rhythm.     Heart sounds: Normal heart sounds, S1 normal and S2 normal. No murmur heard. Pulmonary:     Effort: Pulmonary effort is normal.     Breath sounds: Normal breath sounds. No stridor. No wheezing, rhonchi or rales.     Comments: Clear to auscultation bilaterally Musculoskeletal:     Left knee: No swelling, deformity or bony tenderness. Normal range of motion. Tenderness present over the medial joint line and lateral joint line. No LCL laxity, MCL laxity, ACL laxity or PCL laxity.    Comments: Left knee: Tenderness palpation at inferior joint line.  Pain with flexion and extension.  Strength 5/5.  No ligamentous laxity on exam.  Feet:     Right foot:     Skin integrity: Ulcer and skin breakdown present.     Comments: Ulceration with associated serous drainage noted right third toe.  Foot neurovascularly intact. Neurological:     Mental Status: He is alert.  Psychiatric:        Behavior: Behavior is cooperative.      UC Treatments / Results  Labs (all labs ordered are listed, but only abnormal results are displayed) Labs Reviewed - No data to display  EKG   Radiology No results found.  Procedures Procedures (including critical care time)  Medications Ordered in UC Medications - No data to display  Initial Impression / Assessment and Plan / UC Course  I have reviewed the triage vital signs and the nursing notes.  Pertinent labs & imaging results that were available during my care of the patient were reviewed by me and considered in my medical decision making (see chart for  details).     No indication for x-ray of knee given patient reports a prolonged history of symptoms without recent trauma or severe worsening of pain.  Discussed that pain is likely related to known osteoarthritis and encouraged him to follow-up with orthopedics for further evaluation and management.  He does report severe pain and was given 4 doses of tramadol to be used as needed.   He is unable to take NSAIDs due to concurrent use of Biktarvy and unable to take Tylenol due to history of hepatitis.  He was prescribed tramadol and denies any history of seizure.  He has safely taken this medication several years ago according to EMR.  Discussed that this is sedating and he should not drive or drink alcohol with taking it.  Discussed that he should not combine this with trazodone and reports that he takes this very infrequently as needed for insomnia will not take the medications together.  Discussed alarm symptoms that warrant emergent evaluation.  Suspect that foot pain is related to combination of factors including osteoarthritis related to previous surgery, change in gait related to chronic knee pain, as well as ulcer.  Upon examination ulcerated lesion was noted limited to skin breakdown on pad of third toe.  Maceration and serous drainage was noted in the surrounding area.  Patient was unaware of this and so area was cleaned with chlorhexidine and alcohol in clinic and dressed.  We will start doxycycline and Bactroban.  Discussed the importance of wound care and he was given contact information for podiatry with instruction to call to schedule an appointment.  He was placed in a postop shoe for comfort and support.  Discussed that if he develops any worsening symptoms including enlarging lesion or any systemic symptoms he needs to be seen immediately.  Strict return precautions given.  Work excuse note provided.  Final Clinical Impressions(s) / UC Diagnoses   Final diagnoses:  Chronic pain of left knee  Right foot pain  Ulcer of right foot, limited to breakdown of skin [facility])     Discharge Instructions      I think that your knee pain is related to arthritis.  I have called in a couple doses of tramadol which will help with your pain.  I would recommend you take them primarily at night.  These will make you sleepy so do not drive or drink alcohol with taking them.  Follow-up  with orthopedic as we discussed.  Continue over-the-counter medications as needed for pain relief.  I believe that your foot pain is related to your gait but also the ulcer that we found on your foot.  Please keep this area clean and apply Bactroban ointment twice daily.  Start doxycycline 100 mg twice daily for 10 days.  Stay out of the sun while on this medication.  I do recommend you follow-up with podiatry; call to schedule an appointment.  Use postop shoe as needed for comfort.  If wound enlarges or you develop any fever, nausea, vomiting, body aches, weakness you need to be seen immediately.     ED Prescriptions     Medication Sig Dispense Auth. Provider   traMADol (ULTRAM) 50 MG tablet Take 1 tablet (50 mg total) by mouth every 12 (twelve) hours as needed for up to 2 days. 4 tablet Anandi Abramo K, PA-C   mupirocin ointment (BACTROBAN) 2 % Apply 1 Application topically daily. 22 g Zeeva Courser K, PA-C   doxycycline (VIBRAMYCIN) 100 MG capsule Take 1 capsule (100  mg total) by mouth 2 (two) times daily. 20 capsule Calandria Mullings K, PA-C      I have reviewed the PDMP during this encounter.   Terrilee Croak, PA-C 06/25/21 1451

## 2021-06-25 NOTE — ED Triage Notes (Signed)
Pt reports bilateral knee pain for several weeks. He reports some leg pain as well.

## 2021-07-02 ENCOUNTER — Inpatient Hospital Stay (HOSPITAL_COMMUNITY)
Admission: EM | Admit: 2021-07-02 | Discharge: 2021-07-05 | DRG: 683 | Disposition: A | Discharge status: Other Acute Inpatient Hospital | Payer: Medicaid Other | Attending: Internal Medicine | Admitting: Internal Medicine

## 2021-07-02 ENCOUNTER — Encounter (HOSPITAL_COMMUNITY): Payer: Self-pay

## 2021-07-02 ENCOUNTER — Other Ambulatory Visit: Payer: Self-pay

## 2021-07-02 ENCOUNTER — Ambulatory Visit (HOSPITAL_COMMUNITY)
Admission: EM | Admit: 2021-07-02 | Discharge: 2021-07-02 | Disposition: A | Discharge status: Other Acute Inpatient Hospital | Payer: Medicaid Other | Source: Home / Self Care

## 2021-07-02 DIAGNOSIS — Z21 Asymptomatic human immunodeficiency virus [HIV] infection status: Secondary | ICD-10-CM | POA: Insufficient documentation

## 2021-07-02 DIAGNOSIS — N179 Acute kidney failure, unspecified: Secondary | ICD-10-CM | POA: Diagnosis not present

## 2021-07-02 DIAGNOSIS — M6282 Rhabdomyolysis: Secondary | ICD-10-CM | POA: Diagnosis present

## 2021-07-02 DIAGNOSIS — B181 Chronic viral hepatitis B without delta-agent: Secondary | ICD-10-CM | POA: Diagnosis present

## 2021-07-02 DIAGNOSIS — B20 Human immunodeficiency virus [HIV] disease: Secondary | ICD-10-CM | POA: Diagnosis not present

## 2021-07-02 DIAGNOSIS — F251 Schizoaffective disorder, depressive type: Secondary | ICD-10-CM | POA: Insufficient documentation

## 2021-07-02 DIAGNOSIS — K219 Gastro-esophageal reflux disease without esophagitis: Secondary | ICD-10-CM | POA: Diagnosis present

## 2021-07-02 DIAGNOSIS — K769 Liver disease, unspecified: Secondary | ICD-10-CM | POA: Insufficient documentation

## 2021-07-02 DIAGNOSIS — F319 Bipolar disorder, unspecified: Secondary | ICD-10-CM | POA: Diagnosis present

## 2021-07-02 DIAGNOSIS — R748 Abnormal levels of other serum enzymes: Secondary | ICD-10-CM | POA: Insufficient documentation

## 2021-07-02 DIAGNOSIS — Z91148 Patient's other noncompliance with medication regimen for other reason: Secondary | ICD-10-CM

## 2021-07-02 DIAGNOSIS — Z833 Family history of diabetes mellitus: Secondary | ICD-10-CM

## 2021-07-02 DIAGNOSIS — F1010 Alcohol abuse, uncomplicated: Secondary | ICD-10-CM | POA: Diagnosis present

## 2021-07-02 DIAGNOSIS — Z20822 Contact with and (suspected) exposure to covid-19: Secondary | ICD-10-CM | POA: Insufficient documentation

## 2021-07-02 DIAGNOSIS — Z79899 Other long term (current) drug therapy: Secondary | ICD-10-CM

## 2021-07-02 DIAGNOSIS — R45850 Homicidal ideations: Secondary | ICD-10-CM | POA: Diagnosis present

## 2021-07-02 DIAGNOSIS — F17210 Nicotine dependence, cigarettes, uncomplicated: Secondary | ICD-10-CM | POA: Diagnosis present

## 2021-07-02 DIAGNOSIS — I10 Essential (primary) hypertension: Secondary | ICD-10-CM | POA: Diagnosis present

## 2021-07-02 DIAGNOSIS — L97519 Non-pressure chronic ulcer of other part of right foot with unspecified severity: Secondary | ICD-10-CM | POA: Diagnosis present

## 2021-07-02 DIAGNOSIS — F419 Anxiety disorder, unspecified: Secondary | ICD-10-CM | POA: Diagnosis present

## 2021-07-02 DIAGNOSIS — F32A Depression, unspecified: Secondary | ICD-10-CM | POA: Diagnosis present

## 2021-07-02 DIAGNOSIS — R7401 Elevation of levels of liver transaminase levels: Secondary | ICD-10-CM | POA: Diagnosis present

## 2021-07-02 DIAGNOSIS — Z9151 Personal history of suicidal behavior: Secondary | ICD-10-CM

## 2021-07-02 DIAGNOSIS — R45851 Suicidal ideations: Secondary | ICD-10-CM | POA: Insufficient documentation

## 2021-07-02 DIAGNOSIS — Z800 Family history of malignant neoplasm of digestive organs: Secondary | ICD-10-CM

## 2021-07-02 DIAGNOSIS — Z8249 Family history of ischemic heart disease and other diseases of the circulatory system: Secondary | ICD-10-CM

## 2021-07-02 DIAGNOSIS — R7989 Other specified abnormal findings of blood chemistry: Secondary | ICD-10-CM | POA: Diagnosis present

## 2021-07-02 DIAGNOSIS — F1410 Cocaine abuse, uncomplicated: Secondary | ICD-10-CM | POA: Insufficient documentation

## 2021-07-02 DIAGNOSIS — E785 Hyperlipidemia, unspecified: Secondary | ICD-10-CM | POA: Diagnosis present

## 2021-07-02 DIAGNOSIS — F17290 Nicotine dependence, other tobacco product, uncomplicated: Secondary | ICD-10-CM | POA: Diagnosis present

## 2021-07-02 DIAGNOSIS — R7303 Prediabetes: Secondary | ICD-10-CM | POA: Diagnosis present

## 2021-07-02 DIAGNOSIS — F1590 Other stimulant use, unspecified, uncomplicated: Secondary | ICD-10-CM | POA: Diagnosis present

## 2021-07-02 LAB — COMPREHENSIVE METABOLIC PANEL
ALT: 159 U/L — ABNORMAL HIGH (ref 0–44)
ALT: 165 U/L — ABNORMAL HIGH (ref 0–44)
AST: 499 U/L — ABNORMAL HIGH (ref 15–41)
AST: 457 U/L — ABNORMAL HIGH (ref 15–41)
Albumin: 4.5 g/dL (ref 3.5–5.0)
Albumin: 3.9 g/dL (ref 3.5–5.0)
Alkaline Phosphatase: 103 U/L (ref 38–126)
Alkaline Phosphatase: 91 U/L (ref 38–126)
Anion gap: 9 (ref 5–15)
Anion gap: 10 (ref 5–15)
BUN: 17 mg/dL (ref 6–20)
BUN: 22 mg/dL — ABNORMAL HIGH (ref 6–20)
CO2: 24 mmol/L (ref 22–32)
CO2: 25 mmol/L (ref 22–32)
Calcium: 9.7 mg/dL (ref 8.9–10.3)
Calcium: 9.3 mg/dL (ref 8.9–10.3)
Chloride: 102 mmol/L (ref 98–111)
Chloride: 102 mmol/L (ref 98–111)
Creatinine, Ser: 1.12 mg/dL (ref 0.61–1.24)
Creatinine, Ser: 1.26 mg/dL — ABNORMAL HIGH (ref 0.61–1.24)
GFR, Estimated: >60 mL/min (ref >60)
GFR, Estimated: >60 mL/min (ref >60)
Glucose, Bld: 101 mg/dL — ABNORMAL HIGH (ref 70–99)
Glucose, Bld: 104 mg/dL — ABNORMAL HIGH (ref 70–99)
Potassium: 3.6 mmol/L (ref 3.5–5.1)
Potassium: 3.5 mmol/L (ref 3.5–5.1)
Sodium: 135 mmol/L (ref 135–145)
Sodium: 137 mmol/L (ref 135–145)
Total Bilirubin: 0.8 mg/dL (ref 0.3–1.2)
Total Bilirubin: 1 mg/dL (ref 0.3–1.2)
Total Protein: 8 g/dL (ref 6.5–8.1)
Total Protein: 6.9 g/dL (ref 6.5–8.1)

## 2021-07-02 LAB — ACETAMINOPHEN LEVEL: Acetaminophen (Tylenol), Serum: <10 ug/mL — ABNORMAL LOW (ref 10–30)

## 2021-07-02 LAB — CBC WITH DIFFERENTIAL/PLATELET
Abs Immature Granulocytes: 0.02 10*3/uL (ref 0.00–0.07)
Basophils Absolute: 0 10*3/uL (ref 0.0–0.1)
Basophils Relative: 0 %
Eosinophils Absolute: 0.1 10*3/uL (ref 0.0–0.5)
Eosinophils Relative: 1 %
HCT: 45 % (ref 39.0–52.0)
Hemoglobin: 15.4 g/dL (ref 13.0–17.0)
Immature Granulocytes: 0 %
Lymphocytes Relative: 20 %
Lymphs Abs: 2 10*3/uL (ref 0.7–4.0)
MCH: 30.9 pg (ref 26.0–34.0)
MCHC: 34.2 g/dL (ref 30.0–36.0)
MCV: 90.4 fL (ref 80.0–100.0)
Monocytes Absolute: 0.7 10*3/uL (ref 0.1–1.0)
Monocytes Relative: 7 %
Neutro Abs: 7.3 10*3/uL (ref 1.7–7.7)
Neutrophils Relative %: 72 %
Platelets: 147 10*3/uL — ABNORMAL LOW (ref 150–400)
RBC: 4.98 MIL/uL (ref 4.22–5.81)
RDW: 12.6 % (ref 11.5–15.5)
WBC: 10.1 10*3/uL (ref 4.0–10.5)
nRBC: 0 % (ref 0.0–0.2)

## 2021-07-02 LAB — RESP PANEL BY RT-PCR (FLU A&B, COVID) ARPGX2
Influenza A by PCR: NEGATIVE
Influenza B by PCR: NEGATIVE
SARS Coronavirus 2 by RT PCR: NEGATIVE

## 2021-07-02 LAB — CBC
HCT: 42.4 % (ref 39.0–52.0)
Hemoglobin: 14.3 g/dL (ref 13.0–17.0)
MCH: 30.8 pg (ref 26.0–34.0)
MCHC: 33.7 g/dL (ref 30.0–36.0)
MCV: 91.2 fL (ref 80.0–100.0)
Platelets: 197 10*3/uL (ref 150–400)
RBC: 4.65 MIL/uL (ref 4.22–5.81)
RDW: 12.8 % (ref 11.5–15.5)
WBC: 6.6 10*3/uL (ref 4.0–10.5)
nRBC: 0 % (ref 0.0–0.2)

## 2021-07-02 LAB — RAPID URINE DRUG SCREEN, HOSP PERFORMED
Amphetamines: NOT DETECTED
Barbiturates: NOT DETECTED
Benzodiazepines: NOT DETECTED
Cocaine: POSITIVE — AB
Opiates: NOT DETECTED
Tetrahydrocannabinol: NOT DETECTED

## 2021-07-02 LAB — ETHANOL
Alcohol, Ethyl (B): <10 mg/dL (ref <10)
Alcohol, Ethyl (B): <10 mg/dL (ref <10)

## 2021-07-02 LAB — SALICYLATE LEVEL: Salicylate Lvl: <7 mg/dL — ABNORMAL LOW (ref 7.0–30.0)

## 2021-07-02 LAB — POC SARS CORONAVIRUS 2 AG: SARSCOV2ONAVIRUS 2 AG: NEGATIVE

## 2021-07-02 MED ORDER — HYDROXYZINE HCL 25 MG PO TABS: 25.0000 mg | ORAL_TABLET | Freq: Three times a day (TID) | ORAL | Status: DC | PRN

## 2021-07-02 MED ORDER — BICTEGRAVIR-EMTRICITAB-TENOFOV 50-200-25 MG PO TABS
1.0000 | ORAL_TABLET | Freq: Every day | ORAL | Status: DC
  Administered 2021-07-02: 1 via ORAL
  Filled 2021-07-02: qty 1

## 2021-07-02 MED ORDER — ALUM & MAG HYDROXIDE-SIMETH 200-200-20 MG/5ML PO SUSP
30.0000 mL | Freq: Once | ORAL | Status: AC
  Administered 2021-07-02: 30 mL via ORAL
  Filled 2021-07-02: qty 30

## 2021-07-02 MED ORDER — QUETIAPINE FUMARATE 200 MG PO TABS: 200.0000 mg | ORAL_TABLET | Freq: Every day | ORAL | Status: DC

## 2021-07-02 MED ORDER — TRAZODONE HCL 100 MG PO TABS: 100.0000 mg | ORAL_TABLET | Freq: Every day | ORAL | Status: DC

## 2021-07-02 NOTE — ED Notes (Signed)
Pt resting at this time.  Breathing even and unlabored.   Staff will continue to monitor for safety.

## 2021-07-02 NOTE — Progress Notes (Signed)
Writer saw a piece of cloth hanging in pt's pocket. Pt said  it was "handkerchief " and that he uses it to blow his nose. Pt was informed that he cannot have it on the unit for safety reason and staff will give him tissue for his nose. Pt then stated "you think I will hang myself with it?" Writer tried to explain that it is unit rule and is for safety of others also. Pt became loud and yelled  "I will not give it up." Pt was informed that provider will be notified. Pt was irritable and was not able to redirect. Patrice, NP notified in person. Security was also notified. Pt later gave the "handkerchief " to another nurse. Staff will monitor for pt's safety.

## 2021-07-02 NOTE — ED Provider Notes (Signed)
[facility] Continuous Assessment Admission H&P  Date: 07/02/21 Patient Name: Dylan [name] MRN: [id number]    Diagnoses:  Final diagnoses:  Suicidal ideation  Schizoaffective disorder, depressive type [facility])    HPI: Dylan [name] is a 56 year old male patient who presents to the [facility] behavioral health urgent care voluntary as a walk-in accompanied by EMS with a chief complaint SI. Patient has a past psychiatric history significant for schizophrenia disorder, schizoaffective disorder, substance-induced mood disorder, and polysubstance abuse.   Patient seen and evaluated face-to-face by his provider, chart reviewed. On evaluation, patient is alert and oriented x4. His thought process is logical and relevant. His speech is clear and coherent. His mood is euthymic and affect is congruent. He is noted to be dressed in his work clothes. No evidence that he appears to be responding to internal or external stimuli during the assessment.  On approach, patient states that he was trying to go to sleep forever and not wake up. He reports feeling this way for years. He states that he attempted to inhale paint last night but woke up this morning. He states that the episode was triggered by him seeing images of his deceased daughter last night in his dreams. He states that his daughter died at 4 years ago from what appears to be cardiac complications. He states that he tried to cut himself 2 weeks ago but did not have the right knife. He endorses passive suicidal ideations at this time and describes it as "a little bit, wanting to float away."  He denies a suicide plan at this time. He endorses homicidal ideations towards no specific person and states that he pleads the fifth when asked towards who and states that he only have thoughts. He currently denies auditory or visual hallucination but reports past experiences a few days ago of seeing people who appeared to be looking through his window and on  his roof. He reports using cocaine once 3 days ago and states that he relapsed. He reports drinking alcohol and states that he drank a beer last night. He reports currently living at an [place] house. He reports that he works part-time for [employer]. He reports that he receives outpatient services here at the clinic. He states that he is currently prescribed trazodone, Seroquel, and Vistaril, for his mental health and Biktarvy for HIV. He states that he had a follow up appointment last week but missed the appointment because he was tied up. He states that he needs a medication refills because he has one bottle of medications left. He denies legal issues at this time. He states that he plans to attend college in August for culinary school.   PHQ 2-9:     Flowsheet Row ED from 07/02/2021 in [facility] ED from 06/25/2021 in [facility] ED to Hosp-Admission (Discharged) [facility]  C-SSRS RISK CATEGORY Low Risk No Risk No Risk        Total Time spent with patient: 45 minutes  Musculoskeletal  Strength & Muscle Tone: within normal limits Gait & Station: normal Patient leans: N/A  Psychiatric Specialty Exam  Presentation General Appearance: Disheveled  Eye Contact:Fair  Speech:Clear and Coherent  Speech Volume:Normal  Handedness:Right   Mood and Affect  Mood:Euthymic  Affect:Congruent; Appropriate   Thought Process  Thought Processes:Coherent; Goal Directed  Descriptions of Associations:Intact  Orientation:Full (Time, Place and Person)  Thought Content:Logical  Diagnosis of Schizophrenia or  Schizoaffective disorder in past: Yes  Duration of Psychotic Symptoms: Greater than six months  Hallucinations:Hallucinations: None  Ideas of Reference:None  Suicidal Thoughts:Suicidal Thoughts: Yes, Passive  Homicidal Thoughts:Homicidal Thoughts: Yes, Passive   Sensorium   Memory:Immediate Fair; Recent Fair; Remote Fair  Judgment:Poor  Insight:Lacking   Executive Functions  Concentration:Fair  Attention Span:Fair  Recall:Fair  Fund of Knowledge:Fair  Language:Fair   Psychomotor Activity  Psychomotor Activity:Psychomotor Activity: Normal   Assets  Assets:Communication Skills; Desire for Improvement; Financial Resources/Insurance; Housing; Leisure Time; Physical Health; Talents/Skills   Sleep  Sleep:Sleep: Poor   Nutritional Assessment (For OBS and FBC admissions only) Has the patient had a weight loss or gain of 10 pounds or more in the last 3 months?: No Has the patient had a decrease in food intake/or appetite?: No Does the patient have dental problems?: No Does the patient have eating habits or behaviors that may be indicators of an eating disorder including binging or inducing vomiting?: No Has the patient recently lost weight without trying?: 0 Has the patient been eating poorly because of a decreased appetite?: 0 Malnutrition Screening Tool Score: 0    Physical Exam HENT:     Head: Normocephalic.     Nose: Nose normal.  Eyes:     Conjunctiva/sclera: Conjunctivae normal.  Cardiovascular:     Rate and Rhythm: Normal rate.  Pulmonary:     Effort: Pulmonary effort is normal.  Musculoskeletal:        General: Normal range of motion.     Cervical back: Normal range of motion.  Neurological:     Mental Status: He is alert and oriented to person, place, and time.    Review of Systems  Constitutional: Negative.   HENT: Negative.    Eyes: Negative.   Respiratory: Negative.    Cardiovascular: Negative.   Gastrointestinal: Negative.   Genitourinary: Negative.   Skin: Negative.   Neurological: Negative.   Endo/Heme/Allergies: Negative.    Blood pressure 119/60, pulse 100, temperature 97.8 F (36.6 C), temperature source Oral, resp. rate 18, SpO2 97 %. There is no height or weight on file to calculate BMI.  Past  Psychiatric History: history of schizophrenia, hospitalized at [facility] March 2023.   Is the patient at risk to self? Yes  Has the patient been a risk to self in the past 6 months? Yes .    Has the patient been a risk to self within the distant past? Yes   Is the patient a risk to others? Yes   Has the patient been a risk to others in the past 6 months? No   Has the patient been a risk to others within the distant past? Yes   Past Medical History:  Past Medical History:  Diagnosis Date   Anxiety    Arthritis    Bipolar 1 disorder (HCC)    Colon polyps    Depression    GERD (gastroesophageal reflux disease)    Hepatitis B    Human immunodeficiency virus (HIV) (HCC)    Hyperlipidemia    Hypertension    Neuromuscular disorder (HCC)    neuropathy   Neuropathy    Pre-diabetes    Schizophrenia (HCC)     Past Surgical History:  Procedure Laterality Date   FOOT ARTHRODESIS Right 09/17/2019   Procedure: FUSION RIGHT LISFRANC JOINT;  Surgeon: Nadara Mustard, MD;  Location: [facility] OR;  Service: Orthopedics;  Laterality: Right;   FOOT ARTHRODESIS Right 09/2019   HEMORROIDECTOMY     IR  FLUORO GUIDE CV LINE RIGHT  02/22/2017   IR US GUIDE VASC ACCESS RIGHT  02/22/2017   TUMOR REMOVAL     From Chest    Family History:  Family History  Problem Relation Age of Onset   CAD Mother    Diabetes Father    Colon cancer Maternal Aunt 60   Diabetes Maternal Aunt    Heart disease Maternal Uncle    Esophageal cancer Neg Hx    Rectal cancer Neg Hx    Stomach cancer Neg Hx     Social History:  Social History   Socioeconomic History   Marital status: Married    Spouse name: Not on file   Number of children: 1   Years of education: Not on file   Highest education level: Not on file  Occupational History   Not on file  Tobacco Use   Smoking status: Every Day    Packs/day: 0.50    Years: 25.00    Total pack years: 12.50    Types: E-cigarettes, Cigarettes   Smokeless tobacco: Never   Tobacco  comments:    has patches when he is ready to quit  Vaping Use   Vaping Use: Every day   Start date: 12/09/2020   Substances: Nicotine  Substance and Sexual Activity   Alcohol use: Yes    Comment: occassional drinker   Drug use: Yes    Types: Cocaine    Comment: last use was Friday 03/05/2020   Sexual activity: Yes    Partners: Female    Comment: condoms given  Other Topics Concern   Not on file  Social History Narrative   Not on file   Social Determinants of Health   Financial Resource Strain: Not on file  Food Insecurity: Not on file  Transportation Needs: Not on file  Physical Activity: Not on file  Stress: Not on file  Social Connections: Not on file  Intimate Partner Violence: Not on file    SDOH:  SDOH Screenings   Alcohol Screen: Low Risk  (03/15/2021)   Alcohol Screen    Last Alcohol Screening Score (AUDIT): 3  Depression (PHQ2-9): Medium Risk (07/02/2021)   Depression (PHQ2-9)    PHQ-2 Score: 7  Financial Resource Strain: Not on file  Food Insecurity: Not on file  Housing: Not on file  Physical Activity: Not on file  Social Connections: Not on file  Stress: Not on file  Tobacco Use: High Risk (06/25/2021)   Patient History    Smoking Tobacco Use: Every Day    Smokeless Tobacco Use: Never    Passive Exposure: Not on file  Transportation Needs: Not on file    Last Labs:  Appointment on 04/26/2021  Component Date Value Ref Range Status   Glucose 04/26/2021 101 (H)  70 - 99 mg/dL Final   BUN 21/30/8657 17  6 - 24 mg/dL Final   Creatinine, Ser 04/26/2021 0.99  0.76 - 1.27 mg/dL Final   eGFR 84/69/6295 90  >59 mL/min/1.73 Final   BUN/Creatinine Ratio 04/26/2021 17  9 - 20 Final   Sodium 04/26/2021 138  134 - 144 mmol/L Final   Potassium 04/26/2021 4.4  3.5 - 5.2 mmol/L Final   Chloride 04/26/2021 101  96 - 106 mmol/L Final   CO2 04/26/2021 23  20 - 29 mmol/L Final   Calcium 04/26/2021 9.8  8.7 - 10.2 mg/dL Final   Total Protein 28/41/3244 7.6  6.0 - 8.5  g/dL Final   Albumin 01/11/7251 4.8  3.8 - 4.9 g/dL Final   Globulin, Total 04/26/2021 2.8  1.5 - 4.5 g/dL Final   Albumin/Globulin Ratio 04/26/2021 1.7  1.2 - 2.2 Final   Bilirubin Total 04/26/2021 0.2  0.0 - 1.2 mg/dL Final   Alkaline Phosphatase 04/26/2021 116  44 - 121 IU/L Final   AST 04/26/2021 31  0 - 40 IU/L Final   ALT 04/26/2021 39  0 - 44 IU/L Final   A-1 Antitrypsin 04/26/2021 135  101 - 187 mg/dL Final   Hepatitis B Surface Ag 04/26/2021 CANCELED   Final-Edited   Comment: [facility] was unable to collect sufficient specimen to perform the following test(s), and is providing the patient with re-collection instructions.  Result canceled by the ancillary.    Hep A IgM 04/26/2021 CANCELED   Final-Edited   Comment: [facility] was unable to collect sufficient specimen to perform the following test(s), and is providing the patient with re-collection instructions.  Result canceled by the ancillary.    hep A Total Ab 04/26/2021 CANCELED   Final-Edited   Comment: [facility] was unable to collect sufficient specimen to perform the following test(s), and is providing the patient with re-collection instructions.  Result canceled by the ancillary.    GGT 04/26/2021 43  0 - 65 IU/L Final   Mitochondrial Ab 04/26/2021 <20.0  0.0 - 20.0 Units Final   Comment:                                 Negative    0.0 - 20.0                                 Equivocal  20.1 - 24.9                                 Positive         >24.9 Mitochondrial (M2) Antibodies are found in 90-96% of patients with primary biliary cirrhosis.    Ceruloplasmin 04/26/2021 21.3  16.0 - 31.0 mg/dL Final  Admission on 72/53/6644, Discharged on 03/21/2021  Component Date Value Ref Range Status   Total Protein 03/17/2021 7.7  6.5 - 8.1 g/dL Final   Albumin 03/47/4259 4.3  3.5 - 5.0 g/dL Final   AST 56/38/7564 81 (H)  15 - 41 U/L Final   ALT 03/17/2021 104 (H)  0 - 44 U/L Final   Alkaline Phosphatase 03/17/2021 90  38 -  126 U/L Final   Total Bilirubin 03/17/2021 0.3  0.3 - 1.2 mg/dL Final   Bilirubin, Direct 03/17/2021 0.1  0.0 - 0.2 mg/dL Final   Indirect Bilirubin 03/17/2021 0.2 (L)  0.3 - 0.9 mg/dL Final   Performed at [facility], [address], [place], [place] [address]   Hepatitis B Surface Ag 03/17/2021 Reactive (A)  NON REACTIVE Final   Comment: Sample Positive for HBsAg. Result confirmed by neutralization. HEALTH DEPARTMENT NOTIFIED    HCV Ab 03/17/2021 NON REACTIVE  NON REACTIVE Final   Comment: (NOTE) Nonreactive HCV antibody screen is consistent with no HCV infections,  unless recent infection is suspected or other evidence exists to indicate HCV infection.     Hep A IgM 03/17/2021 NON REACTIVE  NON REACTIVE Final   Hep B C IgM 03/17/2021 NON REACTIVE  NON REACTIVE Final   Performed  at [facility], [address], [place], [place] [address]   Lipase 03/17/2021 62 (H)  11 - 51 U/L Final   Performed at [facility], [address] [place], [place] [address]   Cholesterol 03/17/2021 189  0 - 200 mg/dL Final   Triglycerides 29/56/2130 349 (H)  <150 mg/dL Final   HDL 86/57/8469 44  >40 mg/dL Final   Total CHOL/HDL Ratio 03/17/2021 4.3  RATIO Final   VLDL 03/17/2021 70 (H)  0 - 40 mg/dL Final   LDL Cholesterol 03/17/2021 75  0 - 99 mg/dL Final   Comment:        Total Cholesterol/HDL:CHD Risk Coronary Heart Disease Risk Table                     Men   Women  1/2 Average Risk   3.4   3.3  Average Risk       5.0   4.4  2 X Average Risk   9.6   7.1  3 X Average Risk  23.4   11.0        Use the calculated Patient Ratio above and the CHD Risk Table to determine the patient's CHD Risk.        ATP III CLASSIFICATION (LDL):  <100     mg/dL   Optimal  629-528  mg/dL   Near or Above                    Optimal  130-159  mg/dL   Borderline  413-244  mg/dL   High  >010     mg/dL   Very High Performed at [facility], [address] [place], [place] [address]    RPR Ser Ql 03/17/2021 NON REACTIVE  NON REACTIVE Final   Performed at [facility], [address], [place], [place] [address]   Vitamin B-12 03/17/2021 287  180 - 914 pg/mL Final   Comment: (NOTE) This assay is not validated for testing neonatal or myeloproliferative syndrome specimens for Vitamin B12 levels. Performed at [facility], [address] [place], [place] [address]    WBC 03/17/2021 5.2  4.0 - 10.5 K/uL Final   RBC 03/17/2021 4.73  4.22 - 5.81 MIL/uL Final   Hemoglobin 03/17/2021 14.4  13.0 - 17.0 g/dL Final   HCT 59/56/3875 43.6  39.0 - 52.0 % Final   MCV 03/17/2021 92.2  80.0 - 100.0 fL Final   MCH 03/17/2021 30.4  26.0 - 34.0 pg Final   MCHC 03/17/2021 33.0  30.0 - 36.0 g/dL Final   RDW 64/33/2951 13.2  11.5 - 15.5 % Final   Platelets 03/17/2021 200  150 - 400 K/uL Final   nRBC 03/17/2021 0.0  0.0 - 0.2 % Final   Neutrophils Relative % 03/17/2021 40  % Final   Neutro Abs 03/17/2021 2.1  1.7 - 7.7 K/uL Final   Lymphocytes Relative 03/17/2021 48  % Final   Lymphs Abs 03/17/2021 2.6  0.7 - 4.0 K/uL Final   Monocytes Relative 03/17/2021 7  % Final   Monocytes Absolute 03/17/2021 0.4  0.1 - 1.0 K/uL Final   Eosinophils Relative 03/17/2021 4  % Final   Eosinophils Absolute 03/17/2021 0.2  0.0 - 0.5 K/uL Final   Basophils Relative 03/17/2021 1  % Final   Basophils Absolute 03/17/2021 0.0  0.0 - 0.1 K/uL Final   Immature Granulocytes 03/17/2021 0  %  Final   Abs Immature Granulocytes 03/17/2021 0.01  0.00 - 0.07 K/uL Final   Performed at [facility], [address], [place], [place] [address]   Sodium 03/19/2021 137  135 - 145 mmol/L Final   Potassium 03/19/2021 4.4  3.5 - 5.1 mmol/L Final   Chloride 03/19/2021 104  98 - 111 mmol/L Final   CO2 03/19/2021 24  22 - 32 mmol/L Final   Glucose, Bld 03/19/2021 103 (H)  70 - 99 mg/dL Final   Glucose reference range applies only to samples taken  after fasting for at least 8 hours.   BUN 03/19/2021 15  6 - 20 mg/dL Final   Creatinine, Ser 03/19/2021 1.12  0.61 - 1.24 mg/dL Final   Calcium 56/21/3086 9.2  8.9 - 10.3 mg/dL Final   Total Protein 57/84/6962 7.3  6.5 - 8.1 g/dL Final   Albumin 95/28/4132 4.0  3.5 - 5.0 g/dL Final   AST 44/01/270 45 (H)  15 - 41 U/L Final   ALT 03/19/2021 79 (H)  0 - 44 U/L Final   Alkaline Phosphatase 03/19/2021 78  38 - 126 U/L Final   Total Bilirubin 03/19/2021 0.4  0.3 - 1.2 mg/dL Final   GFR, Estimated 03/19/2021 >60  >60 mL/min Final   Comment: (NOTE) Calculated using the CKD-EPI Creatinine Equation (2021)    Anion gap 03/19/2021 9  5 - 15 Final   Performed at [facility], [address], [place], [place] [address]   WBC 03/19/2021 9.3  4.0 - 10.5 K/uL Final   RBC 03/19/2021 4.79  4.22 - 5.81 MIL/uL Final   Hemoglobin 03/19/2021 14.6  13.0 - 17.0 g/dL Final   HCT 40/34/7425 43.6  39.0 - 52.0 % Final   MCV 03/19/2021 91.0  80.0 - 100.0 fL Final   MCH 03/19/2021 30.5  26.0 - 34.0 pg Final   MCHC 03/19/2021 33.5  30.0 - 36.0 g/dL Final   RDW 95/63/8756 13.2  11.5 - 15.5 % Final   Platelets 03/19/2021 174  150 - 400 K/uL Final   nRBC 03/19/2021 0.0  0.0 - 0.2 % Final   Neutrophils Relative % 03/19/2021 82  % Final   Neutro Abs 03/19/2021 7.6  1.7 - 7.7 K/uL Final   Lymphocytes Relative 03/19/2021 11  % Final   Lymphs Abs 03/19/2021 1.0  0.7 - 4.0 K/uL Final   Monocytes Relative 03/19/2021 6  % Final   Monocytes Absolute 03/19/2021 0.5  0.1 - 1.0 K/uL Final   Eosinophils Relative 03/19/2021 1  % Final   Eosinophils Absolute 03/19/2021 0.1  0.0 - 0.5 K/uL Final   Basophils Relative 03/19/2021 0  % Final   Basophils Absolute 03/19/2021 0.0  0.0 - 0.1 K/uL Final   Immature Granulocytes 03/19/2021 0  % Final   Abs Immature Granulocytes 03/19/2021 0.03  0.00 - 0.07 K/uL Final   Performed at [facility], [address], [place], [place] [address]   Color,  Urine 03/19/2021 YELLOW  YELLOW Final   APPearance 03/19/2021 CLEAR  CLEAR Final   Specific Gravity, Urine 03/19/2021 1.016  1.005 - 1.030 Final   pH 03/19/2021 5.0  5.0 - 8.0 Final   Glucose, UA 03/19/2021 NEGATIVE  NEGATIVE mg/dL Final   Hgb urine dipstick 03/19/2021 NEGATIVE  NEGATIVE Final   Bilirubin Urine 03/19/2021 NEGATIVE  NEGATIVE Final   Ketones, ur 03/19/2021 NEGATIVE  NEGATIVE mg/dL Final   Protein, ur 51/88/4166 NEGATIVE  NEGATIVE mg/dL Final  Nitrite 03/19/2021 NEGATIVE  NEGATIVE Final   Leukocytes,Ua 03/19/2021 NEGATIVE  NEGATIVE Final   Performed at [facility], [address] [place], [place] [address]   Lipase 03/19/2021 41  11 - 51 U/L Final   Performed at [facility], [address] [place], [place] [address]  Admission on 03/13/2021, Discharged on 03/15/2021  Component Date Value Ref Range Status   SARS Coronavirus 2 by RT PCR 03/13/2021 NEGATIVE  NEGATIVE Final   Comment: (NOTE) SARS-CoV-2 target nucleic acids are NOT DETECTED.  The SARS-CoV-2 RNA is generally detectable in upper respiratory specimens during the acute phase of infection. The lowest concentration of SARS-CoV-2 viral copies this assay can detect is 138 copies/mL. A negative result does not preclude SARS-Cov-2 infection and should not be used as the sole basis for treatment or other patient management decisions. A negative result may occur with  improper specimen collection/handling, submission of specimen other than nasopharyngeal swab, presence of viral mutation(s) within the areas targeted by this assay, and inadequate number of viral copies(<138 copies/mL). A negative result must be combined with clinical observations, patient history, and epidemiological information. The expected result is Negative.  Fact Sheet for Patients:  BloggerCourse.com  Fact Sheet for Healthcare Providers:   SeriousBroker.it  This test is no                          t yet approved or cleared by the [place] FDA and  has been authorized for detection and/or diagnosis of SARS-CoV-2 by FDA under an Emergency Use Authorization (EUA). This EUA will remain  in effect (meaning this test can be used) for the duration of the COVID-19 declaration under Section 564(b)(1) of the Act, 21 U.S.C.section 360bbb-3(b)(1), unless the authorization is terminated  or revoked sooner.       Influenza A by PCR 03/13/2021 NEGATIVE  NEGATIVE Final   Influenza B by PCR 03/13/2021 NEGATIVE  NEGATIVE Final   Comment: (NOTE) The Xpert Xpress SARS-CoV-2/FLU/RSV plus assay is intended as an aid in the diagnosis of influenza from Nasopharyngeal swab specimens and should not be used as a sole basis for treatment. Nasal washings and aspirates are unacceptable for Xpert Xpress SARS-CoV-2/FLU/RSV testing.  Fact Sheet for Patients: BloggerCourse.com  Fact Sheet for Healthcare Providers: SeriousBroker.it  This test is not yet approved or cleared by the [place] FDA and has been authorized for detection and/or diagnosis of SARS-CoV-2 by FDA under an Emergency Use Authorization (EUA). This EUA will remain in effect (meaning this test can be used) for the duration of the COVID-19 declaration under Section 564(b)(1) of the Act, 21 U.S.C. section 360bbb-3(b)(1), unless the authorization is terminated or revoked.  Performed at [facility], [address] [place], [place] [address]    WBC 03/13/2021 12.7 (H)  4.0 - 10.5 K/uL Final   RBC 03/13/2021 5.19  4.22 - 5.81 MIL/uL Final   Hemoglobin 03/13/2021 15.7  13.0 - 17.0 g/dL Final   HCT 36/64/4034 46.9  39.0 - 52.0 % Final   MCV 03/13/2021 90.4  80.0 - 100.0 fL Final   MCH 03/13/2021 30.3  26.0 - 34.0 pg Final   MCHC 03/13/2021 33.5  30.0 - 36.0 g/dL Final   RDW 74/25/9563 13.6   11.5 - 15.5 % Final   Platelets 03/13/2021 228  150 - 400 K/uL Final   nRBC 03/13/2021 0.0  0.0 - 0.2 % Final   Neutrophils Relative %  03/13/2021 72  % Final   Neutro Abs 03/13/2021 9.1 (H)  1.7 - 7.7 K/uL Final   Lymphocytes Relative 03/13/2021 20  % Final   Lymphs Abs 03/13/2021 2.5  0.7 - 4.0 K/uL Final   Monocytes Relative 03/13/2021 8  % Final   Monocytes Absolute 03/13/2021 1.0  0.1 - 1.0 K/uL Final   Eosinophils Relative 03/13/2021 0  % Final   Eosinophils Absolute 03/13/2021 0.0  0.0 - 0.5 K/uL Final   Basophils Relative 03/13/2021 0  % Final   Basophils Absolute 03/13/2021 0.0  0.0 - 0.1 K/uL Final   Immature Granulocytes 03/13/2021 0  % Final   Abs Immature Granulocytes 03/13/2021 0.05  0.00 - 0.07 K/uL Final   Performed at [facility], [address] [place], [place] [address]   Sodium 03/13/2021 135  135 - 145 mmol/L Final   Potassium 03/13/2021 4.3  3.5 - 5.1 mmol/L Final   Chloride 03/13/2021 99  98 - 111 mmol/L Final   CO2 03/13/2021 25  22 - 32 mmol/L Final   Glucose, Bld 03/13/2021 77  70 - 99 mg/dL Final   Glucose reference range applies only to samples taken after fasting for at least 8 hours.   BUN 03/13/2021 28 (H)  6 - 20 mg/dL Final   Creatinine, Ser 03/13/2021 1.07  0.61 - 1.24 mg/dL Final   Calcium 13/24/4010 10.1  8.9 - 10.3 mg/dL Final   Total Protein 27/25/3664 8.4 (H)  6.5 - 8.1 g/dL Final   Albumin 40/34/7425 5.0  3.5 - 5.0 g/dL Final   AST 95/63/8756 409 (H)  15 - 41 U/L Final   ALT 03/13/2021 101 (H)  0 - 44 U/L Final   Alkaline Phosphatase 03/13/2021 103  38 - 126 U/L Final   Total Bilirubin 03/13/2021 0.7  0.3 - 1.2 mg/dL Final   GFR, Estimated 03/13/2021 >60  >60 mL/min Final   Comment: (NOTE) Calculated using the CKD-EPI Creatinine Equation (2021)    Anion gap 03/13/2021 11  5 - 15 Final   Performed at [facility], [address], [place], [place] [address]   Hgb A1c MFr Bld 03/13/2021 5.7 (H)  4.8 - 5.6 % Final   Comment:  (NOTE) Pre diabetes:          5.7%-6.4%  Diabetes:              >6.4%  Glycemic control for   <7.0% adults with diabetes    Mean Plasma Glucose 03/13/2021 116.89  mg/dL Final   Performed at [facility], [address], [place], [place] [address]   Alcohol, Ethyl (B) 03/13/2021 <10  <10 mg/dL Final   Comment: (NOTE) Lowest detectable limit for serum alcohol is 10 mg/dL.  For medical purposes only. Performed at [facility], [address], [place], [place] [address]    TSH 03/13/2021 1.051  0.350 - 4.500 uIU/mL Final   Comment: Performed by a 3rd Generation assay with a functional sensitivity of <=0.01 uIU/mL. Performed at [facility], [address], [place], [place] [address]    POC Amphetamine UR 03/13/2021 None Detected  NONE DETECTED (Cut Off Level 1000 ng/mL) Final   POC Secobarbital (BAR) 03/13/2021 None Detected  NONE DETECTED (Cut Off Level 300 ng/mL) Final   POC Buprenorphine (BUP) 03/13/2021 None Detected  NONE DETECTED (Cut Off Level 10 ng/mL) Final   POC Oxazepam (BZO) 03/13/2021 None Detected  NONE DETECTED (Cut Off Level  300 ng/mL) Final   POC Cocaine UR 03/13/2021 Positive (A)  NONE DETECTED (Cut Off Level 300 ng/mL) Final   POC Methamphetamine UR 03/13/2021 None Detected  NONE DETECTED (Cut Off Level 1000 ng/mL) Final   POC Morphine 03/13/2021 None Detected  NONE DETECTED (Cut Off Level 300 ng/mL) Final   POC Oxycodone UR 03/13/2021 None Detected  NONE DETECTED (Cut Off Level 100 ng/mL) Final   POC Methadone UR 03/13/2021 None Detected  NONE DETECTED (Cut Off Level 300 ng/mL) Final   POC Marijuana UR 03/13/2021 None Detected  NONE DETECTED (Cut Off Level 50 ng/mL) Final   SARS Coronavirus 2 Ag 03/13/2021 Negative  Negative Final   SARSCOV2ONAVIRUS 2 AG 03/13/2021 NEGATIVE  NEGATIVE Final   Comment: (NOTE) SARS-CoV-2 antigen NOT DETECTED.   Negative results are presumptive.  Negative results do not preclude SARS-CoV-2 infection and should not  be used as the sole basis for treatment or other patient management decisions, including infection  control decisions, particularly in the presence of clinical signs and  symptoms consistent with COVID-19, or in those who have been in contact with the virus.  Negative results must be combined with clinical observations, patient history, and epidemiological information. The expected result is Negative.  Fact Sheet for Patients: https://www.jennings-kim.com/  Fact Sheet for Healthcare Providers: https://alexander-rogers.biz/  This test is not yet approved or cleared by the [place] FDA and  has been authorized for detection and/or diagnosis of SARS-CoV-2 by FDA under an Emergency Use Authorization (EUA).  This EUA will remain in effect (meaning this test can be used) for the duration of  the COV                          ID-19 declaration under Section 564(b)(1) of the Act, 21 U.S.C. section 360bbb-3(b)(1), unless the authorization is terminated or revoked sooner.     Cholesterol 03/13/2021 196  0 - 200 mg/dL Final   Triglycerides 40/98/1191 62  <150 mg/dL Final   HDL 47/82/9562 56  >40 mg/dL Final   Total CHOL/HDL Ratio 03/13/2021 3.5  RATIO Final   VLDL 03/13/2021 12  0 - 40 mg/dL Final   LDL Cholesterol 03/13/2021 128 (H)  0 - 99 mg/dL Final   Comment:        Total Cholesterol/HDL:CHD Risk Coronary Heart Disease Risk Table                     Men   Women  1/2 Average Risk   3.4   3.3  Average Risk       5.0   4.4  2 X Average Risk   9.6   7.1  3 X Average Risk  23.4   11.0        Use the calculated Patient Ratio above and the CHD Risk Table to determine the patient's CHD Risk.        ATP III CLASSIFICATION (LDL):  <100     mg/dL   Optimal  130-865  mg/dL   Near or Above                    Optimal  130-159  mg/dL   Borderline  784-696  mg/dL   High  >295     mg/dL   Very High Performed at [facility], [address]  [place], [place] [address]    SARS Coronavirus 2 Ag 03/15/2021 Negative  Negative Final  SARSCOV2ONAVIRUS 2 AG 03/15/2021 NEGATIVE  NEGATIVE Final   Comment: (NOTE) SARS-CoV-2 antigen NOT DETECTED.   Negative results are presumptive.  Negative results do not preclude SARS-CoV-2 infection and should not be used as the sole basis for treatment or other patient management decisions, including infection  control decisions, particularly in the presence of clinical signs and  symptoms consistent with COVID-19, or in those who have been in contact with the virus.  Negative results must be combined with clinical observations, patient history, and epidemiological information. The expected result is Negative.  Fact Sheet for Patients: https://www.jennings-kim.com/  Fact Sheet for Healthcare Providers: https://alexander-rogers.biz/  This test is not yet approved or cleared by the [place] FDA and  has been authorized for detection and/or diagnosis of SARS-CoV-2 by FDA under an Emergency Use Authorization (EUA).  This EUA will remain in effect (meaning this test can be used) for the duration of  the COV                          ID-19 declaration under Section 564(b)(1) of the Act, 21 U.S.C. section 360bbb-3(b)(1), unless the authorization is terminated or revoked sooner.    Office Visit on 01/17/2021  Component Date Value Ref Range Status   Glucose 01/17/2021 93  70 - 99 mg/dL Final   BUN 81/19/1478 12  6 - 24 mg/dL Final   Creatinine, Ser 01/17/2021 1.03  0.76 - 1.27 mg/dL Final   eGFR 29/56/2130 86  >59 mL/min/1.73 Final   BUN/Creatinine Ratio 01/17/2021 12  9 - 20 Final   Sodium 01/17/2021 139  134 - 144 mmol/L Final   Potassium 01/17/2021 4.7  3.5 - 5.2 mmol/L Final   Chloride 01/17/2021 103  96 - 106 mmol/L Final   CO2 01/17/2021 22  20 - 29 mmol/L Final   Calcium 01/17/2021 9.4  8.7 - 10.2 mg/dL Final   Total Protein 86/57/8469 7.0  6.0 - 8.5 g/dL Final    Albumin 62/95/2841 4.2  3.8 - 4.9 g/dL Final   Globulin, Total 01/17/2021 2.8  1.5 - 4.5 g/dL Final   Albumin/Globulin Ratio 01/17/2021 1.5  1.2 - 2.2 Final   Bilirubin Total 01/17/2021 <0.2  0.0 - 1.2 mg/dL Final   Alkaline Phosphatase 01/17/2021 104  44 - 121 IU/L Final   AST 01/17/2021 22  0 - 40 IU/L Final   ALT 01/17/2021 31  0 - 44 IU/L Final   Vit D, 25-Hydroxy 01/17/2021 14.4 (L)  30.0 - 100.0 ng/mL Final   Comment: Vitamin D deficiency has been defined by the Institute of Medicine and an Endocrine Society practice guideline as a level of serum 25-OH vitamin D less than 20 ng/mL (1,2). The Endocrine Society went on to further define vitamin D insufficiency as a level between 21 and 29 ng/mL (2). 1. IOM (Institute of Medicine). 2010. Dietary reference    intakes for calcium and D. [place]: The    [employer]. 2. Holick MF, Binkley [place], Bischoff-Ferrari HA, et al.    Evaluation, treatment, and prevention of vitamin D    deficiency: an Endocrine Society clinical practice    guideline. JCEM. 2011 Jul; 96(7):1911-30.   Admission on 01/07/2021, Discharged on 01/11/2021  Component Date Value Ref Range Status   SARS Coronavirus 2 by RT PCR 01/07/2021 NEGATIVE  NEGATIVE Final   Comment: (NOTE) SARS-CoV-2 target nucleic acids are NOT DETECTED.  The SARS-CoV-2 RNA is generally detectable in upper respiratory specimens during  the acute phase of infection. The lowest concentration of SARS-CoV-2 viral copies this assay can detect is 138 copies/mL. A negative result does not preclude SARS-Cov-2 infection and should not be used as the sole basis for treatment or other patient management decisions. A negative result may occur with  improper specimen collection/handling, submission of specimen other than nasopharyngeal swab, presence of viral mutation(s) within the areas targeted by this assay, and inadequate number of viral copies(<138 copies/mL). A negative result must  be combined with clinical observations, patient history, and epidemiological information. The expected result is Negative.  Fact Sheet for Patients:  BloggerCourse.com  Fact Sheet for Healthcare Providers:  SeriousBroker.it  This test is no                          t yet approved or cleared by the [place] FDA and  has been authorized for detection and/or diagnosis of SARS-CoV-2 by FDA under an Emergency Use Authorization (EUA). This EUA will remain  in effect (meaning this test can be used) for the duration of the COVID-19 declaration under Section 564(b)(1) of the Act, 21 U.S.C.section 360bbb-3(b)(1), unless the authorization is terminated  or revoked sooner.       Influenza A by PCR 01/07/2021 NEGATIVE  NEGATIVE Final   Influenza B by PCR 01/07/2021 NEGATIVE  NEGATIVE Final   Comment: (NOTE) The Xpert Xpress SARS-CoV-2/FLU/RSV plus assay is intended as an aid in the diagnosis of influenza from Nasopharyngeal swab specimens and should not be used as a sole basis for treatment. Nasal washings and aspirates are unacceptable for Xpert Xpress SARS-CoV-2/FLU/RSV testing.  Fact Sheet for Patients: BloggerCourse.com  Fact Sheet for Healthcare Providers: SeriousBroker.it  This test is not yet approved or cleared by the [place] FDA and has been authorized for detection and/or diagnosis of SARS-CoV-2 by FDA under an Emergency Use Authorization (EUA). This EUA will remain in effect (meaning this test can be used) for the duration of the COVID-19 declaration under Section 564(b)(1) of the Act, 21 U.S.C. section 360bbb-3(b)(1), unless the authorization is terminated or revoked.  Performed at [facility], [address] [place], [place] [address]    WBC 01/07/2021 10.5  4.0 - 10.5 K/uL Final   RBC 01/07/2021 5.19  4.22 - 5.81 MIL/uL Final   Hemoglobin 01/07/2021  15.4  13.0 - 17.0 g/dL Final   HCT 19/14/7829 47.4  39.0 - 52.0 % Final   MCV 01/07/2021 91.3  80.0 - 100.0 fL Final   MCH 01/07/2021 29.7  26.0 - 34.0 pg Final   MCHC 01/07/2021 32.5  30.0 - 36.0 g/dL Final   RDW 56/21/3086 13.1  11.5 - 15.5 % Final   Platelets 01/07/2021 193  150 - 400 K/uL Final   nRBC 01/07/2021 0.0  0.0 - 0.2 % Final   Neutrophils Relative % 01/07/2021 71  % Final   Neutro Abs 01/07/2021 7.4  1.7 - 7.7 K/uL Final   Lymphocytes Relative 01/07/2021 22  % Final   Lymphs Abs 01/07/2021 2.3  0.7 - 4.0 K/uL Final   Monocytes Relative 01/07/2021 7  % Final   Monocytes Absolute 01/07/2021 0.7  0.1 - 1.0 K/uL Final   Eosinophils Relative 01/07/2021 0  % Final   Eosinophils Absolute 01/07/2021 0.0  0.0 - 0.5 K/uL Final   Basophils Relative 01/07/2021 0  % Final   Basophils Absolute 01/07/2021 0.0  0.0 - 0.1 K/uL Final   Immature Granulocytes 01/07/2021  0  % Final   Abs Immature Granulocytes 01/07/2021 0.03  0.00 - 0.07 K/uL Final   Performed at [facility], [address], [place], [place] [address]   Sodium 01/07/2021 133 (L)  135 - 145 mmol/L Final   Potassium 01/07/2021 4.1  3.5 - 5.1 mmol/L Final   Chloride 01/07/2021 98  98 - 111 mmol/L Final   CO2 01/07/2021 24  22 - 32 mmol/L Final   Glucose, Bld 01/07/2021 103 (H)  70 - 99 mg/dL Final   Glucose reference range applies only to samples taken after fasting for at least 8 hours.   BUN 01/07/2021 19  6 - 20 mg/dL Final   Creatinine, Ser 01/07/2021 1.26 (H)  0.61 - 1.24 mg/dL Final   Calcium 60/45/4098 9.9  8.9 - 10.3 mg/dL Final   Total Protein 11/91/4782 8.4 (H)  6.5 - 8.1 g/dL Final   Albumin 95/62/1308 4.8  3.5 - 5.0 g/dL Final   AST 65/78/4696 102 (H)  15 - 41 U/L Final   ALT 01/07/2021 57 (H)  0 - 44 U/L Final   Alkaline Phosphatase 01/07/2021 129 (H)  38 - 126 U/L Final   Total Bilirubin 01/07/2021 0.8  0.3 - 1.2 mg/dL Final   GFR, Estimated 01/07/2021 >60  >60 mL/min Final   Comment:  (NOTE) Calculated using the CKD-EPI Creatinine Equation (2021)    Anion gap 01/07/2021 11  5 - 15 Final   Performed at [facility], [address] [place], [place] [address]   Alcohol, Ethyl (B) 01/07/2021 <10  <10 mg/dL Final   Comment: (NOTE) Lowest detectable limit for serum alcohol is 10 mg/dL.  For medical purposes only. Performed at [facility], [address], [place], [place] [address]    Cholesterol 01/07/2021 266 (H)  0 - 200 mg/dL Final   Triglycerides 40/10/2723 81  <150 mg/dL Final   HDL 36/64/4034 59  >40 mg/dL Final   Total CHOL/HDL Ratio 01/07/2021 4.5  RATIO Final   VLDL 01/07/2021 16  0 - 40 mg/dL Final   LDL Cholesterol 01/07/2021 191 (H)  0 - 99 mg/dL Final   Comment:        Total Cholesterol/HDL:CHD Risk Coronary Heart Disease Risk Table                     Men   Women  1/2 Average Risk   3.4   3.3  Average Risk       5.0   4.4  2 X Average Risk   9.6   7.1  3 X Average Risk  23.4   11.0        Use the calculated Patient Ratio above and the CHD Risk Table to determine the patient's CHD Risk.        ATP III CLASSIFICATION (LDL):  <100     mg/dL   Optimal  742-595  mg/dL   Near or Above                    Optimal  130-159  mg/dL   Borderline  638-756  mg/dL   High  >433     mg/dL   Very High Performed at [facility], [address] [place], [place] [address]    POC Amphetamine UR 01/07/2021 None Detected  NONE DETECTED (Cut Off Level 1000 ng/mL) Final   POC Secobarbital (BAR) 01/07/2021 None Detected  NONE DETECTED (Cut Off  Level 300 ng/mL) Final   POC Buprenorphine (BUP) 01/07/2021 None Detected  NONE DETECTED (Cut Off Level 10 ng/mL) Final   POC Oxazepam (BZO) 01/07/2021 None Detected  NONE DETECTED (Cut Off Level 300 ng/mL) Final   POC Cocaine UR 01/07/2021 Positive (A)  NONE DETECTED (Cut Off Level 300 ng/mL) Final   POC Methamphetamine UR 01/07/2021 None Detected  NONE DETECTED (Cut Off Level 1000 ng/mL) Final   POC  Morphine 01/07/2021 None Detected  NONE DETECTED (Cut Off Level 300 ng/mL) Final   POC Oxycodone UR 01/07/2021 None Detected  NONE DETECTED (Cut Off Level 100 ng/mL) Final   POC Methadone UR 01/07/2021 None Detected  NONE DETECTED (Cut Off Level 300 ng/mL) Final   POC Marijuana UR 01/07/2021 None Detected  NONE DETECTED (Cut Off Level 50 ng/mL) Final   SARSCOV2ONAVIRUS 2 AG 01/07/2021 NEGATIVE  NEGATIVE Final   Comment: (NOTE) SARS-CoV-2 antigen NOT DETECTED.   Negative results are presumptive.  Negative results do not preclude SARS-CoV-2 infection and should not be used as the sole basis for treatment or other patient management decisions, including infection  control decisions, particularly in the presence of clinical signs and  symptoms consistent with COVID-19, or in those who have been in contact with the virus.  Negative results must be combined with clinical observations, patient history, and epidemiological information. The expected result is Negative.  Fact Sheet for Patients: https://www.jennings-kim.com/  Fact Sheet for Healthcare Providers: https://alexander-rogers.biz/  This test is not yet approved or cleared by the [place] FDA and  has been authorized for detection and/or diagnosis of SARS-CoV-2 by FDA under an Emergency Use Authorization (EUA).  This EUA will remain in effect (meaning this test can be used) for the duration of  the COV                          ID-19 declaration under Section 564(b)(1) of the Act, 21 U.S.C. section 360bbb-3(b)(1), unless the authorization is terminated or revoked sooner.     SARS Coronavirus 2 Ag 01/07/2021 Negative  Negative Preliminary   Sodium 01/08/2021 137  135 - 145 mmol/L Final   Potassium 01/08/2021 3.7  3.5 - 5.1 mmol/L Final   Chloride 01/08/2021 103  98 - 111 mmol/L Final   CO2 01/08/2021 23  22 - 32 mmol/L Final   Glucose, Bld 01/08/2021 123 (H)  70 - 99 mg/dL Final   Glucose reference  range applies only to samples taken after fasting for at least 8 hours.   BUN 01/08/2021 22 (H)  6 - 20 mg/dL Final   Creatinine, Ser 01/08/2021 1.19  0.61 - 1.24 mg/dL Final   Calcium 87/56/4332 9.4  8.9 - 10.3 mg/dL Final   Total Protein 95/18/8416 7.2  6.5 - 8.1 g/dL Final   Albumin 60/63/0160 4.0  3.5 - 5.0 g/dL Final   AST 10/93/2355 109 (H)  15 - 41 U/L Final   ALT 01/08/2021 56 (H)  0 - 44 U/L Final   Alkaline Phosphatase 01/08/2021 105  38 - 126 U/L Final   Total Bilirubin 01/08/2021 0.9  0.3 - 1.2 mg/dL Final   GFR, Estimated 01/08/2021 >60  >60 mL/min Final   Comment: (NOTE) Calculated using the CKD-EPI Creatinine Equation (2021)    Anion gap 01/08/2021 11  5 - 15 Final   Performed at [facility], [address], [place], [place] [address]  Appointment on 01/06/2021  Component Date Value Ref Range Status  CD4 T Cell Abs 01/06/2021 910  400 - 1,790 /uL Final   CD4 % Helper T Cell 01/06/2021 36  33 - 65 % Final   Performed at [facility], [address], [place], [place] [address]   HIV 1 RNA Quant 01/06/2021 <20 (H)  Copies/mL Final   HIV-1 RNA Detected   HIV-1 RNA Quant, Log 01/06/2021 <1.30 (H)  Log cps/mL Final   Comment: HIV-1 RNA Detected . HIV-1 RNA was detected below 20 copies/mL. Viral nucleic acid detected below this level cannot be quantified by the assay. . . Reference Range:                           Not Detected     copies/mL                           Not Detected Log copies/mL . Marland Kitchen The test was performed using Real-Time Polymerase Chain Reaction. . . Reportable Range: 20 copies/mL to 10,000,000 copies/mL (1.30 Log copies/mL to 7.00 Log copies/mL). .   Admission on 01/02/2021, Discharged on 01/03/2021  Component Date Value Ref Range Status   Lipase 01/03/2021 44  11 - 51 U/L Final   Performed at [facility], [address], [place], [place] [address]   Sodium 01/03/2021 137  135 - 145 mmol/L Final   Potassium  01/03/2021 4.2  3.5 - 5.1 mmol/L Final   Chloride 01/03/2021 101  98 - 111 mmol/L Final   CO2 01/03/2021 30  22 - 32 mmol/L Final   Glucose, Bld 01/03/2021 123 (H)  70 - 99 mg/dL Final   Glucose reference range applies only to samples taken after fasting for at least 8 hours.   BUN 01/03/2021 12  6 - 20 mg/dL Final   Creatinine, Ser 01/03/2021 1.12  0.61 - 1.24 mg/dL Final   Calcium 57/84/6962 10.0  8.9 - 10.3 mg/dL Final   Total Protein 95/28/4132 7.6  6.5 - 8.1 g/dL Final   Albumin 44/01/270 4.1  3.5 - 5.0 g/dL Final   AST 53/66/4403 27  15 - 41 U/L Final   ALT 01/03/2021 41  0 - 44 U/L Final   Alkaline Phosphatase 01/03/2021 110  38 - 126 U/L Final   Total Bilirubin 01/03/2021 0.5  0.3 - 1.2 mg/dL Final   GFR, Estimated 01/03/2021 >60  >60 mL/min Final   Comment: (NOTE) Calculated using the CKD-EPI Creatinine Equation (2021)    Anion gap 01/03/2021 6  5 - 15 Final   Performed at [facility], [address] [place], [place] [address]   WBC 01/03/2021 10.4  4.0 - 10.5 K/uL Final   RBC 01/03/2021 5.04  4.22 - 5.81 MIL/uL Final   Hemoglobin 01/03/2021 15.4  13.0 - 17.0 g/dL Final   HCT 95/63/8756 46.0  39.0 - 52.0 % Final   MCV 01/03/2021 91.3  80.0 - 100.0 fL Final   MCH 01/03/2021 30.6  26.0 - 34.0 pg Final   MCHC 01/03/2021 33.5  30.0 - 36.0 g/dL Final   RDW 43/32/9518 12.8  11.5 - 15.5 % Final   Platelets 01/03/2021 231  150 - 400 K/uL Final   nRBC 01/03/2021 0.0  0.0 - 0.2 % Final   Performed at [facility], [address] [place], [place] [address]   Color, Urine 01/03/2021 YELLOW  YELLOW Final   APPearance 01/03/2021 HAZY (  A)  CLEAR Final   Specific Gravity, Urine 01/03/2021 1.015  1.005 - 1.030 Final   pH 01/03/2021 8.0  5.0 - 8.0 Final   Glucose, UA 01/03/2021 NEGATIVE  NEGATIVE mg/dL Final   Hgb urine dipstick 01/03/2021 NEGATIVE  NEGATIVE Final   Bilirubin Urine 01/03/2021 NEGATIVE  NEGATIVE Final   Ketones, ur 01/03/2021 NEGATIVE  NEGATIVE mg/dL Final    Protein, ur 98/11/9145 NEGATIVE  NEGATIVE mg/dL Final   Nitrite 82/95/6213 NEGATIVE  NEGATIVE Final   Leukocytes,Ua 01/03/2021 NEGATIVE  NEGATIVE Final   Comment: Microscopic not done on urines with negative protein, blood, leukocytes, nitrite, or glucose < 500 mg/dL. Performed at [facility], [address], [place], [place] [address]     Allergies: Penicillins, Latex, and Tape  PTA Medications: (Not in a hospital admission)   Medical Decision Making  Patient admitted to there [facility] continuous assessment for  mood stabilization and safety.  Patient is voluntary.   Labs ordered:  Lab Orders         Resp Panel by RT-PCR (Flu A&B, Covid) Anterior Nasal Swab         CBC with Differential/Platelet         Comprehensive metabolic panel         Ethanol         POCT Urine Drug Screen - (I-Screen)         POC SARS Coronavirus 2 Ag-ED - Nasal Swab    EKG  Medications:  Restart home medication Seroquel 200 mg po QHS for schizoaffective  Restart home medication Biktarvy 50/200/25 mg tablet daily for HIV Restart on medication Vistaril 25 mg 3 times daily as needed for anxiety Restart home medication trazodone 100 mg p.o. daily by mouth at bedtime for sleep  Recommendations  Based on my evaluation the patient does not appear to have an emergency medical condition.  Francisca Harbuck L, NP 07/02/21  10:35 AM

## 2021-07-02 NOTE — ED Triage Notes (Signed)
Pt presents to [facility] voluntarily escorted by EMS. Per EMT, pt was complaining of SI with a plan. Pt states he was on the way to work today and was not feeling well so he went back to his hotel. Pt states he is still experiencing grief from his daughter passing away several years ago. Pt states he was hearing voices earlier today, "I want to get these voices out of my head". Pt states he wants to see his provider upstairs in the outpatient but he was informed that the outpatient is closed on weekends. Pt continues to endorse SI with a plan to cut his wrist. Pt denies auditory/visual hallucinations currently. Pt denies HI.

## 2021-07-03 DIAGNOSIS — Z79899 Other long term (current) drug therapy: Secondary | ICD-10-CM | POA: Diagnosis not present

## 2021-07-03 DIAGNOSIS — B181 Chronic viral hepatitis B without delta-agent: Secondary | ICD-10-CM

## 2021-07-03 DIAGNOSIS — F1010 Alcohol abuse, uncomplicated: Secondary | ICD-10-CM

## 2021-07-03 DIAGNOSIS — Z20822 Contact with and (suspected) exposure to covid-19: Secondary | ICD-10-CM | POA: Diagnosis present

## 2021-07-03 DIAGNOSIS — F1410 Cocaine abuse, uncomplicated: Secondary | ICD-10-CM

## 2021-07-03 DIAGNOSIS — R7989 Other specified abnormal findings of blood chemistry: Secondary | ICD-10-CM | POA: Insufficient documentation

## 2021-07-03 DIAGNOSIS — Z91148 Patient's other noncompliance with medication regimen for other reason: Secondary | ICD-10-CM | POA: Diagnosis not present

## 2021-07-03 DIAGNOSIS — R45850 Homicidal ideations: Secondary | ICD-10-CM | POA: Diagnosis present

## 2021-07-03 DIAGNOSIS — Z8249 Family history of ischemic heart disease and other diseases of the circulatory system: Secondary | ICD-10-CM | POA: Diagnosis not present

## 2021-07-03 DIAGNOSIS — R7303 Prediabetes: Secondary | ICD-10-CM | POA: Diagnosis present

## 2021-07-03 DIAGNOSIS — B20 Human immunodeficiency virus [HIV] disease: Secondary | ICD-10-CM | POA: Diagnosis present

## 2021-07-03 DIAGNOSIS — M6282 Rhabdomyolysis: Secondary | ICD-10-CM | POA: Diagnosis present

## 2021-07-03 DIAGNOSIS — F32A Depression, unspecified: Secondary | ICD-10-CM | POA: Diagnosis present

## 2021-07-03 DIAGNOSIS — F251 Schizoaffective disorder, depressive type: Secondary | ICD-10-CM | POA: Diagnosis present

## 2021-07-03 DIAGNOSIS — K219 Gastro-esophageal reflux disease without esophagitis: Secondary | ICD-10-CM | POA: Diagnosis present

## 2021-07-03 DIAGNOSIS — F419 Anxiety disorder, unspecified: Secondary | ICD-10-CM | POA: Diagnosis present

## 2021-07-03 DIAGNOSIS — R45851 Suicidal ideations: Secondary | ICD-10-CM

## 2021-07-03 DIAGNOSIS — E785 Hyperlipidemia, unspecified: Secondary | ICD-10-CM | POA: Diagnosis present

## 2021-07-03 DIAGNOSIS — N179 Acute kidney failure, unspecified: Secondary | ICD-10-CM | POA: Diagnosis present

## 2021-07-03 DIAGNOSIS — Z9151 Personal history of suicidal behavior: Secondary | ICD-10-CM | POA: Diagnosis not present

## 2021-07-03 DIAGNOSIS — Z833 Family history of diabetes mellitus: Secondary | ICD-10-CM | POA: Diagnosis not present

## 2021-07-03 DIAGNOSIS — L97519 Non-pressure chronic ulcer of other part of right foot with unspecified severity: Secondary | ICD-10-CM | POA: Diagnosis present

## 2021-07-03 DIAGNOSIS — F319 Bipolar disorder, unspecified: Secondary | ICD-10-CM | POA: Diagnosis not present

## 2021-07-03 DIAGNOSIS — I10 Essential (primary) hypertension: Secondary | ICD-10-CM | POA: Diagnosis present

## 2021-07-03 DIAGNOSIS — R7401 Elevation of levels of liver transaminase levels: Secondary | ICD-10-CM | POA: Diagnosis present

## 2021-07-03 DIAGNOSIS — Z800 Family history of malignant neoplasm of digestive organs: Secondary | ICD-10-CM | POA: Diagnosis not present

## 2021-07-03 DIAGNOSIS — F17210 Nicotine dependence, cigarettes, uncomplicated: Secondary | ICD-10-CM | POA: Diagnosis present

## 2021-07-03 LAB — COMPREHENSIVE METABOLIC PANEL
ALT: 149 U/L — ABNORMAL HIGH (ref 0–44)
AST: 349 U/L — ABNORMAL HIGH (ref 15–41)
Albumin: 3.4 g/dL — ABNORMAL LOW (ref 3.5–5.0)
Alkaline Phosphatase: 77 U/L (ref 38–126)
Anion gap: 7 (ref 5–15)
BUN: 21 mg/dL — ABNORMAL HIGH (ref 6–20)
CO2: 23 mmol/L (ref 22–32)
Calcium: 8.8 mg/dL — ABNORMAL LOW (ref 8.9–10.3)
Chloride: 105 mmol/L (ref 98–111)
Creatinine, Ser: 1.1 mg/dL (ref 0.61–1.24)
GFR, Estimated: >60 mL/min (ref >60)
Glucose, Bld: 114 mg/dL — ABNORMAL HIGH (ref 70–99)
Potassium: 3.8 mmol/L (ref 3.5–5.1)
Sodium: 135 mmol/L (ref 135–145)
Total Bilirubin: 0.6 mg/dL (ref 0.3–1.2)
Total Protein: 6.5 g/dL (ref 6.5–8.1)

## 2021-07-03 LAB — CK: Total CK: 19220 U/L — ABNORMAL HIGH (ref 49–397)

## 2021-07-03 MED ORDER — ENOXAPARIN SODIUM 40 MG/0.4ML IJ SOSY
40.0000 mg | PREFILLED_SYRINGE | INTRAMUSCULAR | Status: DC
  Administered 2021-07-03 – 2021-07-04 (×2): 40 mg via SUBCUTANEOUS
  Filled 2021-07-03 (×3): qty 0.4

## 2021-07-03 MED ORDER — LORAZEPAM 2 MG/ML IJ SOLN: 1.0000 mg | Freq: Four times a day (QID) | INTRAMUSCULAR | Status: DC | PRN

## 2021-07-03 MED ORDER — ONDANSETRON HCL 4 MG/2ML IJ SOLN: 4.0000 mg | Freq: Four times a day (QID) | INTRAMUSCULAR | Status: DC | PRN

## 2021-07-03 MED ORDER — HYDROXYZINE HCL 25 MG PO TABS
25.0000 mg | ORAL_TABLET | Freq: Three times a day (TID) | ORAL | Status: DC | PRN
  Filled 2021-07-03 (×2): qty 1

## 2021-07-03 MED ORDER — LACTATED RINGERS IV SOLN: INTRAVENOUS | Status: DC

## 2021-07-03 MED ORDER — TRAZODONE HCL 50 MG PO TABS
100.0000 mg | ORAL_TABLET | Freq: Every day | ORAL | Status: DC
  Administered 2021-07-03 – 2021-07-04 (×2): 100 mg via ORAL
  Filled 2021-07-03 (×2): qty 2

## 2021-07-03 MED ORDER — ONDANSETRON HCL 4 MG PO TABS: 4.0000 mg | ORAL_TABLET | Freq: Four times a day (QID) | ORAL | Status: DC | PRN

## 2021-07-03 MED ORDER — ACETAMINOPHEN 650 MG RE SUPP: 650.0000 mg | Freq: Four times a day (QID) | RECTAL | Status: DC | PRN

## 2021-07-03 MED ORDER — ACETAMINOPHEN 325 MG PO TABS
650.0000 mg | ORAL_TABLET | Freq: Four times a day (QID) | ORAL | Status: DC | PRN
  Administered 2021-07-03: 650 mg via ORAL
  Filled 2021-07-03 (×2): qty 2

## 2021-07-03 MED ORDER — LACTATED RINGERS IV BOLUS
1000.0000 mL | Freq: Once | INTRAVENOUS | Status: AC
  Administered 2021-07-03: 1000 mL via INTRAVENOUS

## 2021-07-03 MED ORDER — BICTEGRAVIR-EMTRICITAB-TENOFOV 50-200-25 MG PO TABS
1.0000 | ORAL_TABLET | Freq: Every day | ORAL | Status: DC
  Administered 2021-07-03 – 2021-07-04 (×2): 1 via ORAL
  Filled 2021-07-03 (×4): qty 1

## 2021-07-03 MED ORDER — QUETIAPINE FUMARATE 100 MG PO TABS
200.0000 mg | ORAL_TABLET | Freq: Every day | ORAL | Status: DC
  Administered 2021-07-03 – 2021-07-04 (×2): 200 mg via ORAL
  Filled 2021-07-03: qty 1
  Filled 2021-07-03 (×2): qty 2

## 2021-07-03 NOTE — Assessment & Plan Note (Signed)
Never started doxycycline for this. But doesn't look particularly infected to me today. Will get wound care to look at this.

## 2021-07-03 NOTE — Assessment & Plan Note (Addendum)
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 1. IVF 2. Strict intake and output 3. Daily CMP 4. Daily CPKs

## 2021-07-03 NOTE — Assessment & Plan Note (Addendum)
Patient is IVCd to [facility] Psych consult.

## 2021-07-03 NOTE — Assessment & Plan Note (Signed)
>>  ASSESSMENT AND PLAN FOR TRANSAMINITIS WRITTEN ON 07/03/2021  3:48 AM BY GARDNER, JARED M, DO  LFT elevations most likely secondary to rhabdo. Monitor CMP daily. History of LFT elevations in past as well. 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 Liver 'lesion' in Jan of this year, ended up just being a lymph node on MRI in Feb.  Should be due for repeat CT AP anyhow though, consider ordering this once we are confident his renal function is stable / not going to develop AKI from rhabdo.

## 2021-07-03 NOTE — Assessment & Plan Note (Addendum)
Cocaine positive UDS. Ordering PRN ativan.

## 2021-07-03 NOTE — Assessment & Plan Note (Signed)
-   Continue HAART 

## 2021-07-04 DIAGNOSIS — M6282 Rhabdomyolysis: Secondary | ICD-10-CM | POA: Diagnosis not present

## 2021-07-04 DIAGNOSIS — F1010 Alcohol abuse, uncomplicated: Secondary | ICD-10-CM | POA: Diagnosis not present

## 2021-07-04 DIAGNOSIS — F319 Bipolar disorder, unspecified: Secondary | ICD-10-CM | POA: Diagnosis not present

## 2021-07-04 DIAGNOSIS — B181 Chronic viral hepatitis B without delta-agent: Secondary | ICD-10-CM | POA: Diagnosis not present

## 2021-07-04 LAB — COMPREHENSIVE METABOLIC PANEL
ALT: 123 U/L — ABNORMAL HIGH (ref 0–44)
AST: 194 U/L — ABNORMAL HIGH (ref 15–41)
Albumin: 3 g/dL — ABNORMAL LOW (ref 3.5–5.0)
Alkaline Phosphatase: 69 U/L (ref 38–126)
Anion gap: 7 (ref 5–15)
BUN: 12 mg/dL (ref 6–20)
CO2: 26 mmol/L (ref 22–32)
Calcium: 9 mg/dL (ref 8.9–10.3)
Chloride: 106 mmol/L (ref 98–111)
Creatinine, Ser: 1.04 mg/dL (ref 0.61–1.24)
GFR, Estimated: >60 mL/min (ref >60)
Glucose, Bld: 121 mg/dL — ABNORMAL HIGH (ref 70–99)
Potassium: 3.7 mmol/L (ref 3.5–5.1)
Sodium: 139 mmol/L (ref 135–145)
Total Bilirubin: 0.5 mg/dL (ref 0.3–1.2)
Total Protein: 5.6 g/dL — ABNORMAL LOW (ref 6.5–8.1)

## 2021-07-04 LAB — CK: Total CK: 8621 U/L — ABNORMAL HIGH (ref 49–397)

## 2021-07-04 MED ORDER — DICLOFENAC SODIUM 1 % EX GEL
4.0000 g | Freq: Four times a day (QID) | CUTANEOUS | Status: DC
Start: 2021-07-04 — End: 2021-07-05
  Administered 2021-07-04 – 2021-07-05 (×3): 4 g via TOPICAL
  Filled 2021-07-04: qty 100

## 2021-07-04 MED ORDER — LORAZEPAM 2 MG/ML IJ SOLN
1.0000 mg | INTRAMUSCULAR | Status: DC | PRN
  Administered 2021-07-04: 1 mg via INTRAVENOUS
  Filled 2021-07-04: qty 1

## 2021-07-04 MED ORDER — NICOTINE 21 MG/24HR TD PT24
21.0000 mg | MEDICATED_PATCH | Freq: Every day | TRANSDERMAL | Status: DC
  Administered 2021-07-04: 21 mg via TRANSDERMAL
  Filled 2021-07-04 (×2): qty 1

## 2021-07-05 DIAGNOSIS — F319 Bipolar disorder, unspecified: Secondary | ICD-10-CM | POA: Diagnosis not present

## 2021-07-05 DIAGNOSIS — B181 Chronic viral hepatitis B without delta-agent: Secondary | ICD-10-CM | POA: Diagnosis not present

## 2021-07-05 DIAGNOSIS — M6282 Rhabdomyolysis: Secondary | ICD-10-CM | POA: Diagnosis not present

## 2021-07-05 DIAGNOSIS — F1010 Alcohol abuse, uncomplicated: Secondary | ICD-10-CM | POA: Diagnosis not present

## 2021-07-05 LAB — COMPREHENSIVE METABOLIC PANEL
ALT: 113 U/L — ABNORMAL HIGH (ref 0–44)
AST: 122 U/L — ABNORMAL HIGH (ref 15–41)
Albumin: 3.1 g/dL — ABNORMAL LOW (ref 3.5–5.0)
Alkaline Phosphatase: 73 U/L (ref 38–126)
Anion gap: 8 (ref 5–15)
BUN: 10 mg/dL (ref 6–20)
CO2: 26 mmol/L (ref 22–32)
Calcium: 9.2 mg/dL (ref 8.9–10.3)
Chloride: 104 mmol/L (ref 98–111)
Creatinine, Ser: 1.05 mg/dL (ref 0.61–1.24)
GFR, Estimated: >60 mL/min (ref >60)
Glucose, Bld: 105 mg/dL — ABNORMAL HIGH (ref 70–99)
Potassium: 3.9 mmol/L (ref 3.5–5.1)
Sodium: 138 mmol/L (ref 135–145)
Total Bilirubin: 0.5 mg/dL (ref 0.3–1.2)
Total Protein: 5.8 g/dL — ABNORMAL LOW (ref 6.5–8.1)

## 2021-07-05 LAB — CK: Total CK: 3647 U/L — ABNORMAL HIGH (ref 49–397)

## 2021-07-05 NOTE — TOC Transition Note (Addendum)
Transition of Care [facility]) - CM/SW Discharge Note   Patient Details  Name: Dylan [name] MRN: [id number] Date of Birth: 1966/01/05  [facility] CM/SW Contact:  Ralene Bathe, LCSWA Phone Number: 07/05/2021, 11:07 AM   Clinical Narrative:    Patient discharging to [facility].  Accepting MD is Dr. Forrestine Him.  Pt will go to [facility] [address]. Call to report is [phone].  Sheriff's Department has been called to transport.     Final next level of care: [facility] Barriers to Discharge: No Barriers Identified   Patient Goals and CMS Choice Patient states their goals for this hospitalization and ongoing recovery are:: To go home.      Discharge Placement              Patient chooses bed at:  [facility]) Patient to be transferred to facility by: Sheriff's office   Patient and family notified of of transfer: 07/05/21  Discharge Plan and Services                                     Social Determinants of Health (SDOH) Interventions     Readmission Risk Interventions     No data to display

## 2021-07-05 NOTE — Progress Notes (Signed)
Patient refused morning meds and dressing change for right great toe.  Patient states that he doesn't want to be "bothered".  MD aware.

## 2021-07-05 NOTE — Progress Notes (Signed)
Notified by patient sitter that patient is concerned about missing cell phone.  Reviewed patient belongings and notes for any information about a cell phone.  Per ED notes on 07/03/21, ED RN made attempts to get patient belongings from [facility] urgent care locker.  Patient belongings arrived but no phone was found.  Attempted to locate cell phone by calling [facility] UC security.  Security checked lockers and surrounding areas and were unable to located cell phone.  Per patient, he had an iphone 14 pro max purple and brown wallet with 1 credit card and ID at time of check-in to [facility].  He has not seen belongings since then.  Patient notified unable to lcoate items but both [facility] and [facility] ED departments have been notified along with security.

## 2021-07-05 NOTE — Progress Notes (Signed)
Report called to [name] nurse Greer Pickerel.  AVS reviewed including medications.  All questions and concerns answered.  Patient informed of pending transport to [facility].

## 2021-07-07 ENCOUNTER — Telehealth: Payer: Self-pay | Admitting: *Deleted

## 2021-07-07 NOTE — Telephone Encounter (Signed)
Transition Care Management Unsuccessful Follow-up Telephone Call  Date of discharge and from where:  07/05/21 [facility]  Attempts:  1st Attempt  Reason for unsuccessful TCM follow-up call:  Left voice message   Hubert Azure RN, MSN RN Care Management Coordinator  [phone] [email or web address]

## 2021-08-28 ENCOUNTER — Ambulatory Visit (HOSPITAL_COMMUNITY)
Admission: EM | Admit: 2021-08-28 | Discharge: 2021-08-30 | Disposition: A | Discharge status: Other Acute Inpatient Hospital | Payer: Medicaid Other | Attending: Nurse Practitioner | Admitting: Nurse Practitioner

## 2021-08-28 DIAGNOSIS — F250 Schizoaffective disorder, bipolar type: Secondary | ICD-10-CM | POA: Insufficient documentation

## 2021-08-28 DIAGNOSIS — F1914 Other psychoactive substance abuse with psychoactive substance-induced mood disorder: Secondary | ICD-10-CM | POA: Diagnosis not present

## 2021-08-28 DIAGNOSIS — F411 Generalized anxiety disorder: Secondary | ICD-10-CM | POA: Diagnosis not present

## 2021-08-28 DIAGNOSIS — Z79899 Other long term (current) drug therapy: Secondary | ICD-10-CM | POA: Insufficient documentation

## 2021-08-28 DIAGNOSIS — R45851 Suicidal ideations: Secondary | ICD-10-CM | POA: Insufficient documentation

## 2021-08-28 DIAGNOSIS — Z91148 Patient's other noncompliance with medication regimen for other reason: Secondary | ICD-10-CM | POA: Insufficient documentation

## 2021-08-28 DIAGNOSIS — Z9151 Personal history of suicidal behavior: Secondary | ICD-10-CM | POA: Insufficient documentation

## 2021-08-28 DIAGNOSIS — Z20822 Contact with and (suspected) exposure to covid-19: Secondary | ICD-10-CM | POA: Insufficient documentation

## 2021-08-28 DIAGNOSIS — F251 Schizoaffective disorder, depressive type: Secondary | ICD-10-CM

## 2021-08-28 LAB — COMPREHENSIVE METABOLIC PANEL
ALT: 40 U/L (ref 0–44)
AST: 38 U/L (ref 15–41)
Albumin: 4.4 g/dL (ref 3.5–5.0)
Alkaline Phosphatase: 83 U/L (ref 38–126)
Anion gap: 8 (ref 5–15)
BUN: 14 mg/dL (ref 6–20)
CO2: 27 mmol/L (ref 22–32)
Calcium: 9.7 mg/dL (ref 8.9–10.3)
Chloride: 102 mmol/L (ref 98–111)
Creatinine, Ser: 1.1 mg/dL (ref 0.61–1.24)
GFR, Estimated: >60 mL/min (ref >60)
Glucose, Bld: 79 mg/dL (ref 70–99)
Potassium: 3.8 mmol/L (ref 3.5–5.1)
Sodium: 137 mmol/L (ref 135–145)
Total Bilirubin: 0.8 mg/dL (ref 0.3–1.2)
Total Protein: 7.7 g/dL (ref 6.5–8.1)

## 2021-08-28 LAB — CBC WITH DIFFERENTIAL/PLATELET
Abs Immature Granulocytes: 0.04 10*3/uL (ref 0.00–0.07)
Basophils Absolute: 0 10*3/uL (ref 0.0–0.1)
Basophils Relative: 0 %
Eosinophils Absolute: 0 10*3/uL (ref 0.0–0.5)
Eosinophils Relative: 0 %
HCT: 42.6 % (ref 39.0–52.0)
Hemoglobin: 14.3 g/dL (ref 13.0–17.0)
Immature Granulocytes: 0 %
Lymphocytes Relative: 21 %
Lymphs Abs: 2.4 10*3/uL (ref 0.7–4.0)
MCH: 30.8 pg (ref 26.0–34.0)
MCHC: 33.6 g/dL (ref 30.0–36.0)
MCV: 91.6 fL (ref 80.0–100.0)
Monocytes Absolute: 0.9 10*3/uL (ref 0.1–1.0)
Monocytes Relative: 8 %
Neutro Abs: 7.8 10*3/uL — ABNORMAL HIGH (ref 1.7–7.7)
Neutrophils Relative %: 71 %
Platelets: 219 10*3/uL (ref 150–400)
RBC: 4.65 MIL/uL (ref 4.22–5.81)
RDW: 13.2 % (ref 11.5–15.5)
WBC: 11.2 10*3/uL — ABNORMAL HIGH (ref 4.0–10.5)
nRBC: 0 % (ref 0.0–0.2)

## 2021-08-28 LAB — LIPID PANEL
Cholesterol: 244 mg/dL — ABNORMAL HIGH (ref 0–200)
HDL: 51 mg/dL (ref >40)
LDL Cholesterol: 180 mg/dL — ABNORMAL HIGH (ref 0–99)
Total CHOL/HDL Ratio: 4.8 RATIO
Triglycerides: 65 mg/dL (ref <150)
VLDL: 13 mg/dL (ref 0–40)

## 2021-08-28 LAB — RESP PANEL BY RT-PCR (FLU A&B, COVID) ARPGX2
Influenza A by PCR: NEGATIVE
Influenza B by PCR: NEGATIVE
SARS Coronavirus 2 by RT PCR: NEGATIVE

## 2021-08-28 LAB — POC SARS CORONAVIRUS 2 AG: SARSCOV2ONAVIRUS 2 AG: NEGATIVE

## 2021-08-28 LAB — POC SARS CORONAVIRUS 2 AG (NURSE ORDER)

## 2021-08-28 MED ORDER — GABAPENTIN 300 MG PO CAPS
300.0000 mg | ORAL_CAPSULE | Freq: Three times a day (TID) | ORAL | Status: DC
  Administered 2021-08-28 – 2021-08-30 (×8): 300 mg via ORAL
  Filled 2021-08-28 (×8): qty 1

## 2021-08-28 MED ORDER — MAGNESIUM HYDROXIDE 400 MG/5ML PO SUSP: 30.0000 mL | Freq: Every day | ORAL | Status: DC | PRN

## 2021-08-28 MED ORDER — QUETIAPINE FUMARATE 200 MG PO TABS
200.0000 mg | ORAL_TABLET | Freq: Every day | ORAL | Status: DC
  Administered 2021-08-28: 200 mg via ORAL
  Filled 2021-08-28: qty 1

## 2021-08-28 MED ORDER — HYDROXYZINE HCL 25 MG PO TABS
25.0000 mg | ORAL_TABLET | Freq: Three times a day (TID) | ORAL | Status: DC | PRN
  Administered 2021-08-28: 25 mg via ORAL
  Filled 2021-08-28: qty 1

## 2021-08-28 MED ORDER — OLANZAPINE 5 MG PO TBDP
5.0000 mg | ORAL_TABLET | Freq: Three times a day (TID) | ORAL | Status: DC | PRN
  Administered 2021-08-30: 5 mg via ORAL
  Filled 2021-08-28: qty 1

## 2021-08-28 MED ORDER — ROSUVASTATIN CALCIUM 5 MG PO TABS
10.0000 mg | ORAL_TABLET | Freq: Every day | ORAL | Status: DC
  Administered 2021-08-28 – 2021-08-30 (×3): 10 mg via ORAL
  Filled 2021-08-28 (×3): qty 2

## 2021-08-28 MED ORDER — LORAZEPAM 1 MG PO TABS: 1.0000 mg | ORAL_TABLET | ORAL | Status: DC | PRN

## 2021-08-28 MED ORDER — BICTEGRAVIR-EMTRICITAB-TENOFOV 50-200-25 MG PO TABS
1.0000 | ORAL_TABLET | Freq: Every day | ORAL | Status: DC
  Administered 2021-08-28 – 2021-08-30 (×3): 1 via ORAL
  Filled 2021-08-28 (×3): qty 1

## 2021-08-28 MED ORDER — TRAZODONE HCL 50 MG PO TABS
50.0000 mg | ORAL_TABLET | Freq: Every evening | ORAL | Status: DC | PRN
  Filled 2021-08-28: qty 1

## 2021-08-28 MED ORDER — TRAZODONE HCL 100 MG PO TABS
100.0000 mg | ORAL_TABLET | Freq: Every day | ORAL | Status: DC
  Administered 2021-08-28 – 2021-08-29 (×2): 100 mg via ORAL
  Filled 2021-08-28 (×2): qty 1

## 2021-08-28 MED ORDER — LORAZEPAM 1 MG PO TABS
1.0000 mg | ORAL_TABLET | ORAL | Status: AC
  Administered 2021-08-28: 1 mg via ORAL
  Filled 2021-08-28: qty 1

## 2021-08-28 MED ORDER — ACETAMINOPHEN 325 MG PO TABS
650.0000 mg | ORAL_TABLET | Freq: Four times a day (QID) | ORAL | Status: DC | PRN
  Administered 2021-08-30: 650 mg via ORAL
  Filled 2021-08-28: qty 2

## 2021-08-28 MED ORDER — ALUM & MAG HYDROXIDE-SIMETH 200-200-20 MG/5ML PO SUSP: 30.0000 mL | ORAL | Status: DC | PRN

## 2021-08-28 MED ORDER — ZIPRASIDONE MESYLATE 20 MG IM SOLR: 20.0000 mg | INTRAMUSCULAR | Status: DC | PRN

## 2021-08-28 NOTE — Progress Notes (Signed)
   08/28/21 0558  [place] Triage Screening (Walk-ins at [facility] only)  How Did You Hear About [place]? Legal System  What Is the Reason for Your Visit/Call Today? Pt brought voluntarily to [facility] via Event organiser. Pt has a diagnosis of schizoaffective disorder and says he ran out of psychiatric medication last month. He appears paranoid and delusional, believing that "skinamites" were in his house and threatening to cut his heart out with a kitchen knife. He reports trying to escape them and walking downtown. He says he was going to jump from the top of a hotel downtown but was told to leave. He then was going to jump from the top of a parking deck but security intervened. Pt says he was walking in the road so the skinamites could not get to him. Law enforcement reports they picked up Pt walking "half naked" in the street.  How Long Has This Been Causing You Problems? 1 wk - 1 month  Have You Recently Had Any Thoughts About Hurting Yourself? Yes  How long ago did you have thoughts about hurting yourself? Today  Are You Planning to Commit Suicide/Harm Yourself At This time? Yes  Have you Recently Had Thoughts About Hurting Someone [name]? No  Are You Planning To Harm Someone At This Time? No  Are you currently experiencing any auditory, visual or other hallucinations? Yes  Please explain the hallucinations you are currently experiencing: Pt reports hearing voices telling him to kill himself.  Have You Used Any Alcohol or Drugs in the Past 24 Hours? No  Do you have any current medical co-morbidities that require immediate attention? No  Clinician description of patient physical appearance/behavior: Pt is disheveled, alert and oriented x4. Pt speaks in a clear tone, at moderate volume and normal pace. Motor behavior appears restless. Eye contact is good. Pt's mood is depressed and anxious, affect is labile. Thought process is coherent with delusional content. Pt appears to be responding to internal stimuli, at times  yelling at people while alone in the assessment room.  What Do You Feel Would Help You the Most Today? Treatment for Depression or other mood problem;Medication(s)  If access to [facility]gent [facility] was not available, would you have sought care in the Emergency Department? Yes  Determination of Need Emergent (2 hours)  Options For Referral Inpatient Hospitalization;Outpatient Therapy;BH Urgent Care;Medication Management

## 2021-08-28 NOTE — ED Notes (Signed)
Patient lying in bed sleeping, no distress noted, will continue to monitor patient for safety.

## 2021-08-28 NOTE — ED Notes (Signed)
Patient is currently lying in bed quietly, no distress noted

## 2021-08-28 NOTE — ED Notes (Signed)
PT has been given a snack of cheezits and cranberry juice

## 2021-08-28 NOTE — ED Provider Notes (Signed)
[facility] Continuous Assessment Admission H&P  Date: 08/28/21 Patient Name: Dylan [name] MRN: [id number] Chief Complaint: delusional and paranoia Chief Complaint  Patient presents with   Delusional   Suicidal      Diagnoses:  Final diagnoses:  Schizoaffective disorder, depressive type ([place])   HPI: Dylan [name] is a 56 year old male with a history of schizoaffective disorder depressive type, generalized anxiety disorder substance abuse, substance-induced mood disorder and suicidal ideation presenting to the [place] via [employer]. 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  Patient states that he was at home tonight and he was sharpening his chef knives and the "skinmikes" were there trying to cut his heart out of chest. Patient states he ran out of house and down the street and to a parking garage and a Animal nutritionist saw him sitting on the ledge, going to jump and security told he needed to leave. It was unclear how patient became involved with [employer].   Patient endorses using cocaine and marijuana with last cocaine use 2 days ago. Pt endorses poor sleep in the context of cocaine use, suicidal ideation and depression. Patient presents with rambling, pressured speech, is very tangential and appears delusional and paranoid with a labile mood and congruent affect. Patient alone in the evaluation room can be heard talking to himself. Patient states that he has been out of medications since July due to not having any refills. Patient has had 6 ED visits in the last six months. Patient states that he wants to restart his medications.   Patient states that several of his family members passed away during covid and also his daughter passed away and he made reference to wanting to join her. Patient will be admitted to [facility] continuous assessment for crisis management, safety and stabilization.  No current facility-administered medications on  file prior to encounter.   Current Outpatient Medications on File Prior to Encounter  Medication Sig Dispense Refill   bictegravir-emtricitabine-tenofovir AF (BIKTARVY) 50-200-25 MG TABS tablet Take 1 tablet by mouth daily. 30 tablet 5   gabapentin (NEURONTIN) 300 MG capsule Take 1 capsule (300 mg total) by mouth 3 (three) times daily. 90 capsule 0   hydrOXYzine (ATARAX) 25 MG tablet Take 1 tablet (25 mg total) by mouth 3 (three) times daily as needed for anxiety. 30 tablet 0   mupirocin ointment (BACTROBAN) 2 % Apply 1 Application topically daily. 22 g 0   QUEtiapine (SEROQUEL) 200 MG tablet Take 1 tablet (200 mg total) by mouth at bedtime. 30 tablet 0   rosuvastatin (CRESTOR) 10 MG tablet Take 1 tablet (10 mg total) by mouth daily. 90 tablet 1   traZODone (DESYREL) 100 MG tablet Take 1 tablet (100 mg total) by mouth at bedtime. 30 tablet 0     PHQ 2-9:  Flowsheet Row ED from 07/02/2021 in [facility] Office Visit from 03/23/2021 in [place] Office Visit from 01/17/2021 in [facility]  Thoughts that you would be better off dead, or of hurting yourself in some way Several days Not at all More than half the days  PHQ-9 Total Score $RemoveBef'7 10 18       'FHarvizXBS$ Flowsheet Row ED from 08/28/2021 in [facility] Most recent reading at 08/28/2021  6:45 AM ED to Hosp-Admission (Discharged) from 07/02/2021 in [place] Most recent  reading at 07/03/2021 12:00 PM ED from 07/02/2021 in [facility] Most recent reading at 07/02/2021 10:10 AM  C-SSRS RISK CATEGORY High Risk High Risk Low Risk        Total Time spent with patient: 30 minutes  Musculoskeletal  Strength & Muscle Tone: within normal limits Gait & Station: normal Patient leans: N/A  Psychiatric Specialty Exam  Presentation General Appearance: Disheveled  Eye Contact:Fair  Speech:Pressured;  Garbled  Speech Volume:Increased  Handedness:Right   Mood and Affect  Mood:Anxious  Affect:Labile   Thought Process  Thought Processes:Disorganized  Descriptions of Associations:Tangential  Orientation:Partial  Thought Content:Delusions; Tangential; Scattered  Diagnosis of Schizophrenia or Schizoaffective disorder in past: Yes  Duration of Psychotic Symptoms: Greater than six months  Hallucinations:Hallucinations: Visual Description of Visual Hallucinations: someone was trying to cut his heart out of his chest  Ideas of Reference:Paranoia  Suicidal Thoughts:Suicidal Thoughts: Yes, Passive SI Passive Intent and/or Plan: Without Intent; Without Plan  Homicidal Thoughts:Homicidal Thoughts: No   Sensorium  Memory:Immediate Poor; Remote Poor  Judgment:Impaired  Insight:Lacking   Executive Functions  Concentration:Poor  Attention Span:Fair  Recall:Fair  Fund of Knowledge:Fair  Language:Fair   Psychomotor Activity  Psychomotor Activity:Psychomotor Activity: Normal   Assets  Assets:Desire for Improvement; Housing; Physical Health; Financial Resources/Insurance   Sleep  Sleep:Sleep: Poor Number of Hours of Sleep: -1   Nutritional Assessment (For OBS and FBC admissions only) Has the patient had a weight loss or gain of 10 pounds or more in the last 3 months?: No Has the patient had a decrease in food intake/or appetite?: No Does the patient have dental problems?: No Does the patient have eating habits or behaviors that may be indicators of an eating disorder including binging or inducing vomiting?: No Has the patient recently lost weight without trying?: 0 Has the patient been eating poorly because of a decreased appetite?: 0 Malnutrition Screening Tool Score: 0    Physical Exam HENT:     Head: Normocephalic and atraumatic.     Nose: Nose normal.  Eyes:     Pupils: Pupils are equal, round, and reactive to light.  Cardiovascular:     Rate and  Rhythm: Normal rate.  Pulmonary:     Effort: Pulmonary effort is normal.  Abdominal:     General: Abdomen is flat.  Musculoskeletal:        General: Normal range of motion.     Cervical back: Normal range of motion.  Skin:    General: Skin is warm.  Neurological:     Mental Status: He is alert and oriented to person, place, and time.  Psychiatric:        Attention and Perception: Attention normal.        Mood and Affect: Mood is anxious and depressed. Affect is labile.        Speech: Speech is rapid and pressured and tangential.        Behavior: Behavior is cooperative.        Thought Content: Thought content is paranoid and delusional. Thought content includes suicidal ideation.        Cognition and Memory: Cognition is impaired.        Judgment: Judgment is impulsive.    Review of Systems  Constitutional: Negative.   HENT: Negative.    Eyes: Negative.   Respiratory: Negative.    Cardiovascular: Negative.   Genitourinary: Negative.   Musculoskeletal: Negative.   Skin: Negative.   Neurological: Negative.   Endo/Heme/Allergies: Negative.  Psychiatric/Behavioral:  Positive for depression, hallucinations and suicidal ideas. The patient is nervous/anxious.     Blood pressure (!) 138/96, pulse 80, temperature 99.1 F (37.3 C), temperature source Oral, resp. rate 20, SpO2 100 %. There is no height or weight on file to calculate BMI.  Past Psychiatric History: [facility] [place]  Is the patient at risk to self? Yes  Has the patient been a risk to self in the past 6 months? Yes .    Has the patient been a risk to self within the distant past? Yes   Is the patient a risk to others? No   Has the patient been a risk to others in the past 6 months? No   Has the patient been a risk to others within the distant past? No   Past Medical History:  Past Medical History:  Diagnosis Date   Anxiety    Arthritis    Bipolar 1 disorder ([place])    Colon polyps    Depression    GERD  (gastroesophageal reflux disease)    Hepatitis B    Human immunodeficiency virus (HIV) ([place])    Hyperlipidemia    Hypertension    Neuromuscular disorder ([place])    neuropathy   Neuropathy    Pre-diabetes    Schizophrenia ([place])     Past Surgical History:  Procedure Laterality Date   FOOT ARTHRODESIS Right 09/17/2019   Procedure: FUSION RIGHT LISFRANC JOINT;  Surgeon: Newt Minion, MD;  Location: [place];  Service: Orthopedics;  Laterality: Right;   FOOT ARTHRODESIS Right 09/2019   HEMORROIDECTOMY     IR FLUORO GUIDE CV LINE RIGHT  02/22/2017   IR US GUIDE VASC ACCESS RIGHT  02/22/2017   TUMOR REMOVAL     From Chest    Family History:  Family History  Problem Relation Age of Onset   CAD Mother    Diabetes Father    Colon cancer Maternal Aunt 60   Diabetes Maternal Aunt    Heart disease Maternal Uncle    Esophageal cancer Neg Hx    Rectal cancer Neg Hx    Stomach cancer Neg Hx     Social History:  Social History   Socioeconomic History   Marital status: Married    Spouse name: Not on file   Number of children: 1   Years of education: Not on file   Highest education level: Not on file  Occupational History   Not on file  Tobacco Use   Smoking status: Every Day    Packs/day: 0.50    Years: 25.00    Total pack years: 12.50    Types: E-cigarettes, Cigarettes   Smokeless tobacco: Never   Tobacco comments:    has patches when he is ready to quit  Vaping Use   Vaping Use: Every day   Start date: 12/09/2020   Substances: Nicotine  Substance and Sexual Activity   Alcohol use: Yes    Comment: occassional drinker   Drug use: Yes    Types: Cocaine    Comment: last use was Friday 03/05/2020   Sexual activity: Yes    Partners: Female    Comment: condoms given  Other Topics Concern   Not on file  Social History Narrative   Not on file   Social Determinants of Health   Financial Resource Strain: Not on file  Food Insecurity: Not on file  Transportation Needs: Not on  file  Physical Activity: Not on file  Stress: Not on file  Social Connections: Not on file  Intimate Partner Violence: Not on file    SDOH:  SDOH Screenings   Alcohol Screen: Low Risk  (03/15/2021)   Alcohol Screen    Last Alcohol Screening Score (AUDIT): 3  Depression (PHQ2-9): Medium Risk (07/02/2021)   Depression (PHQ2-9)    PHQ-2 Score: 7  Financial Resource Strain: Not on file  Food Insecurity: Not on file  Housing: Not on file  Physical Activity: Not on file  Social Connections: Not on file  Stress: Not on file  Tobacco Use: High Risk (07/02/2021)   Patient History    Smoking Tobacco Use: Every Day    Smokeless Tobacco Use: Never    Passive Exposure: Not on file  Transportation Needs: Not on file    Last Labs:  Admission on 07/02/2021, Discharged on 07/05/2021  Component Date Value Ref Range Status   Sodium 07/02/2021 137  135 - 145 mmol/L Final   Potassium 07/02/2021 3.5  3.5 - 5.1 mmol/L Final   Chloride 07/02/2021 102  98 - 111 mmol/L Final   CO2 07/02/2021 25  22 - 32 mmol/L Final   Glucose, Bld 07/02/2021 104 (H)  70 - 99 mg/dL Final   Glucose reference range applies only to samples taken after fasting for at least 8 hours.   BUN 07/02/2021 22 (H)  6 - 20 mg/dL Final   Creatinine, Ser 07/02/2021 1.26 (H)  0.61 - 1.24 mg/dL Final   Calcium 07/02/2021 9.3  8.9 - 10.3 mg/dL Final   Total Protein 07/02/2021 6.9  6.5 - 8.1 g/dL Final   Albumin 07/02/2021 3.9  3.5 - 5.0 g/dL Final   AST 07/02/2021 457 (H)  15 - 41 U/L Final   ALT 07/02/2021 165 (H)  0 - 44 U/L Final   Alkaline Phosphatase 07/02/2021 91  38 - 126 U/L Final   Total Bilirubin 07/02/2021 1.0  0.3 - 1.2 mg/dL Final   GFR, Estimated 07/02/2021 >60  >60 mL/min Final   Comment: (NOTE) Calculated using the CKD-EPI Creatinine Equation (2021)    Anion gap 07/02/2021 10  5 - 15 Final   Performed at [facility], [place] [address], [place], [place] [address]   Alcohol, Ethyl (B) 07/02/2021 <10  <10 mg/dL  Final   Comment: (NOTE) Lowest detectable limit for serum alcohol is 10 mg/dL.  For medical purposes only. Performed at [facility], [place] [address] [place], [name] [address]    Salicylate Lvl 00/17/4944 <7.0 (L)  7.0 - 30.0 mg/dL Final   Performed at [place] [address], [place], [name] [address]   Acetaminophen (Tylenol), Serum 07/02/2021 <10 (L)  10 - 30 ug/mL Final   Comment: (NOTE) Therapeutic concentrations vary significantly. A range of 10-30 ug/mL  may be an effective concentration for many patients. However, some  are best treated at concentrations outside of this range. Acetaminophen concentrations >150 ug/mL at 4 hours after ingestion  and >50 ug/mL at 12 hours after ingestion are often associated with  toxic reactions.  Performed at [facility], [place] [address], [place], [name] [address]    WBC 07/02/2021 6.6  4.0 - 10.5 K/uL Final   RBC 07/02/2021 4.65  4.22 - 5.81 MIL/uL Final   Hemoglobin 07/02/2021 14.3  13.0 - 17.0 g/dL Final   HCT 07/02/2021 42.4  39.0 - 52.0 % Final   MCV 07/02/2021 91.2  80.0 - 100.0 fL Final   MCH 07/02/2021 30.8  26.0 -  34.0 pg Final   MCHC 07/02/2021 33.7  30.0 - 36.0 g/dL Final   RDW 95/39/8405 12.8  11.5 - 15.5 % Final   Platelets 07/02/2021 197  150 - 400 K/uL Final   nRBC 07/02/2021 0.0  0.0 - 0.2 % Final   Performed at [facility], [address] [place], [place] [address]   Opiates 07/02/2021 NONE DETECTED  NONE DETECTED Final   Cocaine 07/02/2021 POSITIVE (A)  NONE DETECTED Final   Benzodiazepines 07/02/2021 NONE DETECTED  NONE DETECTED Final   Amphetamines 07/02/2021 NONE DETECTED  NONE DETECTED Final   Tetrahydrocannabinol 07/02/2021 NONE DETECTED  NONE DETECTED Final   Barbiturates 07/02/2021 NONE DETECTED  NONE DETECTED Final   Comment: (NOTE) DRUG SCREEN FOR MEDICAL PURPOSES ONLY.  IF CONFIRMATION IS NEEDED FOR ANY PURPOSE, NOTIFY LAB WITHIN 5 DAYS.  LOWEST DETECTABLE LIMITS FOR  URINE DRUG SCREEN Drug Class                     Cutoff (ng/mL) Amphetamine and metabolites    1000 Barbiturate and metabolites    200 Benzodiazepine                 200 Tricyclics and metabolites     300 Opiates and metabolites        300 Cocaine and metabolites        300 THC                            50 Performed at [facility], [address] [place], [place] [address]    Total CK 07/03/2021 19,220 (H)  49 - 397 U/L Final   Comment: RESULTS CONFIRMED BY MANUAL DILUTION Performed at [facility], [address] [place], [place] [address]    Sodium 07/03/2021 135  135 - 145 mmol/L Final   Potassium 07/03/2021 3.8  3.5 - 5.1 mmol/L Final   Chloride 07/03/2021 105  98 - 111 mmol/L Final   CO2 07/03/2021 23  22 - 32 mmol/L Final   Glucose, Bld 07/03/2021 114 (H)  70 - 99 mg/dL Final   Glucose reference range applies only to samples taken after fasting for at least 8 hours.   BUN 07/03/2021 21 (H)  6 - 20 mg/dL Final   Creatinine, Ser 07/03/2021 1.10  0.61 - 1.24 mg/dL Final   Calcium 07/98/1747 8.8 (L)  8.9 - 10.3 mg/dL Final   Total Protein 86/89/7693 6.5  6.5 - 8.1 g/dL Final   Albumin 00/88/9013 3.4 (L)  3.5 - 5.0 g/dL Final   AST 85/52/8942 349 (H)  15 - 41 U/L Final   ALT 07/03/2021 149 (H)  0 - 44 U/L Final   Alkaline Phosphatase 07/03/2021 77  38 - 126 U/L Final   Total Bilirubin 07/03/2021 0.6  0.3 - 1.2 mg/dL Final   GFR, Estimated 07/03/2021 >60  >60 mL/min Final   Comment: (NOTE) Calculated using the CKD-EPI Creatinine Equation (2021)    Anion gap 07/03/2021 7  5 - 15 Final   Performed at [facility], [address] [place], [place] [address]   Sodium 07/04/2021 139  135 - 145 mmol/L Final   Potassium 07/04/2021 3.7  3.5 - 5.1 mmol/L Final   Chloride 07/04/2021 106  98 - 111 mmol/L Final   CO2 07/04/2021 26  22 - 32 mmol/L Final   Glucose, Bld 07/04/2021 121 (H)  70 - 99 mg/dL Final   Glucose reference range applies only to samples taken  after fasting for at least 8 hours.   BUN 07/04/2021 12  6 - 20 mg/dL Final   Creatinine, Ser 07/04/2021 1.04  0.61 - 1.24 mg/dL Final   Calcium 07/04/2021 9.0  8.9 - 10.3 mg/dL Final   Total Protein 07/04/2021 5.6 (L)  6.5 - 8.1 g/dL Final   Albumin 07/04/2021 3.0 (L)  3.5 - 5.0 g/dL Final   AST 07/04/2021 194 (H)  15 - 41 U/L Final   ALT 07/04/2021 123 (H)  0 - 44 U/L Final   Alkaline Phosphatase 07/04/2021 69  38 - 126 U/L Final   Total Bilirubin 07/04/2021 0.5  0.3 - 1.2 mg/dL Final   GFR, Estimated 07/04/2021 >60  >60 mL/min Final   Comment: (NOTE) Calculated using the CKD-EPI Creatinine Equation (2021)    Anion gap 07/04/2021 7  5 - 15 Final   Performed at [place] [address], [place], [name] [address]   Total CK 07/04/2021 8,621 (H)  49 - 397 U/L Final   Comment: RESULTS CONFIRMED BY MANUAL DILUTION Performed at [facility], [place] [address], [place], [name] [address]    Sodium 07/05/2021 138  135 - 145 mmol/L Final   Potassium 07/05/2021 3.9  3.5 - 5.1 mmol/L Final   Chloride 07/05/2021 104  98 - 111 mmol/L Final   CO2 07/05/2021 26  22 - 32 mmol/L Final   Glucose, Bld 07/05/2021 105 (H)  70 - 99 mg/dL Final   Glucose reference range applies only to samples taken after fasting for at least 8 hours.   BUN 07/05/2021 10  6 - 20 mg/dL Final   Creatinine, Ser 07/05/2021 1.05  0.61 - 1.24 mg/dL Final   Calcium 07/05/2021 9.2  8.9 - 10.3 mg/dL Final   Total Protein 07/05/2021 5.8 (L)  6.5 - 8.1 g/dL Final   Albumin 07/05/2021 3.1 (L)  3.5 - 5.0 g/dL Final   AST 07/05/2021 122 (H)  15 - 41 U/L Final   ALT 07/05/2021 113 (H)  0 - 44 U/L Final   Alkaline Phosphatase 07/05/2021 73  38 - 126 U/L Final   Total Bilirubin 07/05/2021 0.5  0.3 - 1.2 mg/dL Final   GFR, Estimated 07/05/2021 >60  >60 mL/min Final   Comment: (NOTE) Calculated using the CKD-EPI Creatinine Equation (2021)    Anion gap 07/05/2021 8  5 - 15 Final   Performed at [facility],  [place] [address], [place], [name] [address]   Total CK 07/05/2021 3,647 (H)  49 - 397 U/L Final   Performed at [place] [address] [place], [place] [address]  Admission on 07/02/2021, Discharged on 07/02/2021  Component Date Value Ref Range Status   SARS Coronavirus 2 by RT PCR 07/02/2021 NEGATIVE  NEGATIVE Final   Comment: (NOTE) SARS-CoV-2 target nucleic acids are NOT DETECTED.  The SARS-CoV-2 RNA is generally detectable in upper respiratory specimens during the acute phase of infection. The lowest concentration of SARS-CoV-2 viral copies this assay can detect is 138 copies/mL. A negative result does not preclude SARS-Cov-2 infection and should not be used as the sole basis for treatment or other patient management decisions. A negative result may occur with  improper specimen collection/handling, submission of specimen other than nasopharyngeal swab, presence of viral mutation(s) within the areas targeted by this assay, and inadequate number of viral copies(<138 copies/mL). A  negative result must be combined with clinical observations, patient history, and epidemiological information. The expected result is Negative.  Fact Sheet for Patients:  BloggerCourse.com  Fact Sheet for Healthcare Providers:  SeriousBroker.it  This test is no                          t yet approved or cleared by the [place] FDA and  has been authorized for detection and/or diagnosis of SARS-CoV-2 by FDA under an Emergency Use Authorization (EUA). This EUA will remain  in effect (meaning this test can be used) for the duration of the COVID-19 declaration under Section 564(b)(1) of the Act, 21 U.S.C.section 360bbb-3(b)(1), unless the authorization is terminated  or revoked sooner.       Influenza A by PCR 07/02/2021 NEGATIVE  NEGATIVE Final   Influenza B by PCR 07/02/2021 NEGATIVE  NEGATIVE Final   Comment: (NOTE) The Xpert Xpress  SARS-CoV-2/FLU/RSV plus assay is intended as an aid in the diagnosis of influenza from Nasopharyngeal swab specimens and should not be used as a sole basis for treatment. Nasal washings and aspirates are unacceptable for Xpert Xpress SARS-CoV-2/FLU/RSV testing.  Fact Sheet for Patients: BloggerCourse.com  Fact Sheet for Healthcare Providers: SeriousBroker.it  This test is not yet approved or cleared by the [place] FDA and has been authorized for detection and/or diagnosis of SARS-CoV-2 by FDA under an Emergency Use Authorization (EUA). This EUA will remain in effect (meaning this test can be used) for the duration of the COVID-19 declaration under Section 564(b)(1) of the Act, 21 U.S.C. section 360bbb-3(b)(1), unless the authorization is terminated or revoked.  Performed at [facility], [address], [place], [place] [address]    WBC 07/02/2021 10.1  4.0 - 10.5 K/uL Final   RBC 07/02/2021 4.98  4.22 - 5.81 MIL/uL Final   Hemoglobin 07/02/2021 15.4  13.0 - 17.0 g/dL Final   HCT 39/79/5369 45.0  39.0 - 52.0 % Final   MCV 07/02/2021 90.4  80.0 - 100.0 fL Final   MCH 07/02/2021 30.9  26.0 - 34.0 pg Final   MCHC 07/02/2021 34.2  30.0 - 36.0 g/dL Final   RDW 22/30/0979 12.6  11.5 - 15.5 % Final   Platelets 07/02/2021 147 (L)  150 - 400 K/uL Final   nRBC 07/02/2021 0.0  0.0 - 0.2 % Final   Neutrophils Relative % 07/02/2021 72  % Final   Neutro Abs 07/02/2021 7.3  1.7 - 7.7 K/uL Final   Lymphocytes Relative 07/02/2021 20  % Final   Lymphs Abs 07/02/2021 2.0  0.7 - 4.0 K/uL Final   Monocytes Relative 07/02/2021 7  % Final   Monocytes Absolute 07/02/2021 0.7  0.1 - 1.0 K/uL Final   Eosinophils Relative 07/02/2021 1  % Final   Eosinophils Absolute 07/02/2021 0.1  0.0 - 0.5 K/uL Final   Basophils Relative 07/02/2021 0  % Final   Basophils Absolute 07/02/2021 0.0  0.0 - 0.1 K/uL Final   Immature Granulocytes 07/02/2021 0  %  Final   Abs Immature Granulocytes 07/02/2021 0.02  0.00 - 0.07 K/uL Final   Performed at [facility], [address], [place], [place] [address]   Sodium 07/02/2021 135  135 - 145 mmol/L Final   Potassium 07/02/2021 3.6  3.5 - 5.1 mmol/L Final   Chloride 07/02/2021 102  98 - 111 mmol/L Final   CO2 07/02/2021 24  22 - 32 mmol/L Final  Glucose, Bld 07/02/2021 101 (H)  70 - 99 mg/dL Final   Glucose reference range applies only to samples taken after fasting for at least 8 hours.   BUN 07/02/2021 17  6 - 20 mg/dL Final   Creatinine, Ser 07/02/2021 1.12  0.61 - 1.24 mg/dL Final   Calcium 07/02/2021 9.7  8.9 - 10.3 mg/dL Final   Total Protein 07/02/2021 8.0  6.5 - 8.1 g/dL Final   Albumin 07/02/2021 4.5  3.5 - 5.0 g/dL Final   AST 07/02/2021 499 (H)  15 - 41 U/L Final   ALT 07/02/2021 159 (H)  0 - 44 U/L Final   Alkaline Phosphatase 07/02/2021 103  38 - 126 U/L Final   Total Bilirubin 07/02/2021 0.8  0.3 - 1.2 mg/dL Final   GFR, Estimated 07/02/2021 >60  >60 mL/min Final   Comment: (NOTE) Calculated using the CKD-EPI Creatinine Equation (2021)    Anion gap 07/02/2021 9  5 - 15 Final   Performed at [facility] [address] [place], [place] [address]   Alcohol, Ethyl (B) 07/02/2021 <10  <10 mg/dL Final   Comment: (NOTE) Lowest detectable limit for serum alcohol is 10 mg/dL.  For medical purposes only. Performed at [facility], [place] [address] [place], [place] [address]    SARSCOV2ONAVIRUS 2 AG 07/02/2021 NEGATIVE  NEGATIVE Final   Comment: (NOTE) SARS-CoV-2 antigen NOT DETECTED.   Negative results are presumptive.  Negative results do not preclude SARS-CoV-2 infection and should not be used as the sole basis for treatment or other patient management decisions, including infection  control decisions, particularly in the presence of clinical signs and  symptoms consistent with COVID-19, or in those who have been in contact with the virus.  Negative results  must be combined with clinical observations, patient history, and epidemiological information. The expected result is Negative.  Fact Sheet for Patients: HandmadeRecipes.com.cy  Fact Sheet for Healthcare Providers: FuneralLife.at  This test is not yet approved or cleared by the [place] FDA and  has been authorized for detection and/or diagnosis of SARS-CoV-2 by FDA under an Emergency Use Authorization (EUA).  This EUA will remain in effect (meaning this test can be used) for the duration of  the COV                          ID-19 declaration under Section 564(b)(1) of the Act, 21 U.S.C. section 360bbb-3(b)(1), unless the authorization is terminated or revoked sooner.    Appointment on 04/26/2021  Component Date Value Ref Range Status   Glucose 04/26/2021 101 (H)  70 - 99 mg/dL Final   BUN 04/26/2021 17  6 - 24 mg/dL Final   Creatinine, Ser 04/26/2021 0.99  0.76 - 1.27 mg/dL Final   eGFR 04/26/2021 90  >59 mL/min/1.73 Final   BUN/Creatinine Ratio 04/26/2021 17  9 - 20 Final   Sodium 04/26/2021 138  134 - 144 mmol/L Final   Potassium 04/26/2021 4.4  3.5 - 5.2 mmol/L Final   Chloride 04/26/2021 101  96 - 106 mmol/L Final   CO2 04/26/2021 23  20 - 29 mmol/L Final   Calcium 04/26/2021 9.8  8.7 - 10.2 mg/dL Final   Total Protein 04/26/2021 7.6  6.0 - 8.5 g/dL Final   Albumin 04/26/2021 4.8  3.8 - 4.9 g/dL Final   Globulin, Total 04/26/2021 2.8  1.5 - 4.5 g/dL Final   Albumin/Globulin Ratio 04/26/2021 1.7  1.2 - 2.2  Final   Bilirubin Total 04/26/2021 0.2  0.0 - 1.2 mg/dL Final   Alkaline Phosphatase 04/26/2021 116  44 - 121 IU/L Final   AST 04/26/2021 31  0 - 40 IU/L Final   ALT 04/26/2021 39  0 - 44 IU/L Final   A-1 Antitrypsin 04/26/2021 135  101 - 187 mg/dL Final   Hepatitis B Surface Ag 04/26/2021 CANCELED   Final-Edited   Comment: [facility] was unable to collect sufficient specimen to perform the following test(s), and is  providing the patient with re-collection instructions.  Result canceled by the ancillary.    Hep A IgM 04/26/2021 CANCELED   Final-Edited   Comment: [facility] was unable to collect sufficient specimen to perform the following test(s), and is providing the patient with re-collection instructions.  Result canceled by the ancillary.    hep A Total Ab 04/26/2021 CANCELED   Final-Edited   Comment: [facility] was unable to collect sufficient specimen to perform the following test(s), and is providing the patient with re-collection instructions.  Result canceled by the ancillary.    GGT 04/26/2021 43  0 - 65 IU/L Final   Mitochondrial Ab 04/26/2021 <20.0  0.0 - 20.0 Units Final   Comment:                                 Negative    0.0 - 20.0                                 Equivocal  20.1 - 24.9                                 Positive         >24.9 Mitochondrial (M2) Antibodies are found in 90-96% of patients with primary biliary cirrhosis.    Ceruloplasmin 04/26/2021 21.3  16.0 - 31.0 mg/dL Final  Admission on 48/35/5997, Discharged on 03/21/2021  Component Date Value Ref Range Status   Total Protein 03/17/2021 7.7  6.5 - 8.1 g/dL Final   Albumin 68/23/5775 4.3  3.5 - 5.0 g/dL Final   AST 61/97/1856 81 (H)  15 - 41 U/L Final   ALT 03/17/2021 104 (H)  0 - 44 U/L Final   Alkaline Phosphatase 03/17/2021 90  38 - 126 U/L Final   Total Bilirubin 03/17/2021 0.3  0.3 - 1.2 mg/dL Final   Bilirubin, Direct 03/17/2021 0.1  0.0 - 0.2 mg/dL Final   Indirect Bilirubin 03/17/2021 0.2 (L)  0.3 - 0.9 mg/dL Final   Performed at [facility], [address] [place], [place] [address]   Hepatitis B Surface Ag 03/17/2021 Reactive (A)  NON REACTIVE Final   Comment: Sample Positive for HBsAg. Result confirmed by neutralization. HEALTH DEPARTMENT NOTIFIED    HCV Ab 03/17/2021 NON REACTIVE  NON REACTIVE Final   Comment: (NOTE) Nonreactive HCV antibody screen is consistent with no HCV  infections,  unless recent infection is suspected or other evidence exists to indicate HCV infection.     Hep A IgM 03/17/2021 NON REACTIVE  NON REACTIVE Final   Hep B C IgM 03/17/2021 NON REACTIVE  NON REACTIVE Final   Performed at [facility], [address] [place], [place] [address]   Lipase 03/17/2021 62 (H)  11 - 51 U/L Final  Performed at [facility], [place] [address], [place], [place] [address]   Cholesterol 03/17/2021 189  0 - 200 mg/dL Final   Triglycerides 03/17/2021 349 (H)  <150 mg/dL Final   HDL 03/17/2021 44  >40 mg/dL Final   Total CHOL/HDL Ratio 03/17/2021 4.3  RATIO Final   VLDL 03/17/2021 70 (H)  0 - 40 mg/dL Final   LDL Cholesterol 03/17/2021 75  0 - 99 mg/dL Final   Comment:        Total Cholesterol/HDL:CHD Risk Coronary Heart Disease Risk Table                     Men   Women  1/2 Average Risk   3.4   3.3  Average Risk       5.0   4.4  2 X Average Risk   9.6   7.1  3 X Average Risk  23.4   11.0        Use the calculated Patient Ratio above and the CHD Risk Table to determine the patient's CHD Risk.        ATP III CLASSIFICATION (LDL):  <100     mg/dL   Optimal  100-129  mg/dL   Near or Above                    Optimal  130-159  mg/dL   Borderline  160-189  mg/dL   High  >190     mg/dL   Very High Performed at [place] [address] [place], [name] [address]    RPR Ser Ql 03/17/2021 NON REACTIVE  NON REACTIVE Final   Performed at [facility], [place] [address], [place], [place] [address]   Vitamin B-12 03/17/2021 287  180 - 914 pg/mL Final   Comment: (NOTE) This assay is not validated for testing neonatal or myeloproliferative syndrome specimens for Vitamin B12 levels. Performed at [facility], [place] [address], [place], [name] [address]    WBC 03/17/2021 5.2  4.0 - 10.5 K/uL Final   RBC 03/17/2021 4.73  4.22 - 5.81 MIL/uL Final   Hemoglobin 03/17/2021 14.4  13.0 - 17.0  g/dL Final   HCT 03/17/2021 43.6  39.0 - 52.0 % Final   MCV 03/17/2021 92.2  80.0 - 100.0 fL Final   MCH 03/17/2021 30.4  26.0 - 34.0 pg Final   MCHC 03/17/2021 33.0  30.0 - 36.0 g/dL Final   RDW 03/17/2021 13.2  11.5 - 15.5 % Final   Platelets 03/17/2021 200  150 - 400 K/uL Final   nRBC 03/17/2021 0.0  0.0 - 0.2 % Final   Neutrophils Relative % 03/17/2021 40  % Final   Neutro Abs 03/17/2021 2.1  1.7 - 7.7 K/uL Final   Lymphocytes Relative 03/17/2021 48  % Final   Lymphs Abs 03/17/2021 2.6  0.7 - 4.0 K/uL Final   Monocytes Relative 03/17/2021 7  % Final   Monocytes Absolute 03/17/2021 0.4  0.1 - 1.0 K/uL Final   Eosinophils Relative 03/17/2021 4  % Final   Eosinophils Absolute 03/17/2021 0.2  0.0 - 0.5 K/uL Final   Basophils Relative 03/17/2021 1  % Final   Basophils Absolute 03/17/2021 0.0  0.0 - 0.1 K/uL Final   Immature Granulocytes 03/17/2021 0  % Final   Abs Immature Granulocytes 03/17/2021 0.01  0.00 - 0.07 K/uL Final   Performed at [facility], [address]  [place], [name] [address]   Sodium 03/19/2021 137  135 - 145 mmol/L Final   Potassium 03/19/2021 4.4  3.5 - 5.1 mmol/L Final   Chloride 03/19/2021 104  98 - 111 mmol/L Final   CO2 03/19/2021 24  22 - 32 mmol/L Final   Glucose, Bld 03/19/2021 103 (H)  70 - 99 mg/dL Final   Glucose reference range applies only to samples taken after fasting for at least 8 hours.   BUN 03/19/2021 15  6 - 20 mg/dL Final   Creatinine, Ser 03/19/2021 1.12  0.61 - 1.24 mg/dL Final   Calcium 03/19/2021 9.2  8.9 - 10.3 mg/dL Final   Total Protein 03/19/2021 7.3  6.5 - 8.1 g/dL Final   Albumin 03/19/2021 4.0  3.5 - 5.0 g/dL Final   AST 03/19/2021 45 (H)  15 - 41 U/L Final   ALT 03/19/2021 79 (H)  0 - 44 U/L Final   Alkaline Phosphatase 03/19/2021 78  38 - 126 U/L Final   Total Bilirubin 03/19/2021 0.4  0.3 - 1.2 mg/dL Final   GFR, Estimated 03/19/2021 >60  >60 mL/min Final   Comment: (NOTE) Calculated using the CKD-EPI  Creatinine Equation (2021)    Anion gap 03/19/2021 9  5 - 15 Final   Performed at [facility], [place] [address], [place], [name] [address]   WBC 03/19/2021 9.3  4.0 - 10.5 K/uL Final   RBC 03/19/2021 4.79  4.22 - 5.81 MIL/uL Final   Hemoglobin 03/19/2021 14.6  13.0 - 17.0 g/dL Final   HCT 03/19/2021 43.6  39.0 - 52.0 % Final   MCV 03/19/2021 91.0  80.0 - 100.0 fL Final   MCH 03/19/2021 30.5  26.0 - 34.0 pg Final   MCHC 03/19/2021 33.5  30.0 - 36.0 g/dL Final   RDW 03/19/2021 13.2  11.5 - 15.5 % Final   Platelets 03/19/2021 174  150 - 400 K/uL Final   nRBC 03/19/2021 0.0  0.0 - 0.2 % Final   Neutrophils Relative % 03/19/2021 82  % Final   Neutro Abs 03/19/2021 7.6  1.7 - 7.7 K/uL Final   Lymphocytes Relative 03/19/2021 11  % Final   Lymphs Abs 03/19/2021 1.0  0.7 - 4.0 K/uL Final   Monocytes Relative 03/19/2021 6  % Final   Monocytes Absolute 03/19/2021 0.5  0.1 - 1.0 K/uL Final   Eosinophils Relative 03/19/2021 1  % Final   Eosinophils Absolute 03/19/2021 0.1  0.0 - 0.5 K/uL Final   Basophils Relative 03/19/2021 0  % Final   Basophils Absolute 03/19/2021 0.0  0.0 - 0.1 K/uL Final   Immature Granulocytes 03/19/2021 0  % Final   Abs Immature Granulocytes 03/19/2021 0.03  0.00 - 0.07 K/uL Final   Performed at [facility], [place] [address] [place], [place] [address]   Color, Urine 03/19/2021 YELLOW  YELLOW Final   APPearance 03/19/2021 CLEAR  CLEAR Final   Specific Gravity, Urine 03/19/2021 1.016  1.005 - 1.030 Final   pH 03/19/2021 5.0  5.0 - 8.0 Final   Glucose, UA 03/19/2021 NEGATIVE  NEGATIVE mg/dL Final   Hgb urine dipstick 03/19/2021 NEGATIVE  NEGATIVE Final   Bilirubin Urine 03/19/2021 NEGATIVE  NEGATIVE Final   Ketones, ur 03/19/2021 NEGATIVE  NEGATIVE mg/dL Final   Protein, ur 03/19/2021 NEGATIVE  NEGATIVE mg/dL Final   Nitrite 03/19/2021 NEGATIVE  NEGATIVE Final   Leukocytes,Ua 03/19/2021 NEGATIVE  NEGATIVE Final   Performed at [facility], [place] [address] [place], [name] [address]   Lipase 03/19/2021 41  11 - 51 U/L Final   Performed at [facility], [place] [address], [place], [place] [address]  Admission on 03/13/2021, Discharged on 03/15/2021  Component Date Value Ref Range Status   SARS Coronavirus 2 by RT PCR 03/13/2021 NEGATIVE  NEGATIVE Final   Comment: (NOTE) SARS-CoV-2 target nucleic acids are NOT DETECTED.  The SARS-CoV-2 RNA is generally detectable in upper respiratory specimens during the acute phase of infection. The lowest concentration of SARS-CoV-2 viral copies this assay can detect is 138 copies/mL. A negative result does not preclude SARS-Cov-2 infection and should not be used as the sole basis for treatment or other patient management decisions. A negative result may occur with  improper specimen collection/handling, submission of specimen other than nasopharyngeal swab, presence of viral mutation(s) within the areas targeted by this assay, and inadequate number of viral copies(<138 copies/mL). A negative result must be combined with clinical observations, patient history, and epidemiological information. The expected result is Negative.  Fact Sheet for Patients:  EntrepreneurPulse.com.au  Fact Sheet for Healthcare Providers:  IncredibleEmployment.be  This test is no                          t yet approved or cleared by the [place] FDA and  has been authorized for detection and/or diagnosis of SARS-CoV-2 by FDA under an Emergency Use Authorization (EUA). This EUA will remain  in effect (meaning this test can be used) for the duration of the COVID-19 declaration under Section 564(b)(1) of the Act, 21 U.S.C.section 360bbb-3(b)(1), unless the authorization is terminated  or revoked sooner.       Influenza A by PCR 03/13/2021 NEGATIVE  NEGATIVE Final   Influenza B by PCR 03/13/2021 NEGATIVE  NEGATIVE Final    Comment: (NOTE) The Xpert Xpress SARS-CoV-2/FLU/RSV plus assay is intended as an aid in the diagnosis of influenza from Nasopharyngeal swab specimens and should not be used as a sole basis for treatment. Nasal washings and aspirates are unacceptable for Xpert Xpress SARS-CoV-2/FLU/RSV testing.  Fact Sheet for Patients: EntrepreneurPulse.com.au  Fact Sheet for Healthcare Providers: IncredibleEmployment.be  This test is not yet approved or cleared by the [place] FDA and has been authorized for detection and/or diagnosis of SARS-CoV-2 by FDA under an Emergency Use Authorization (EUA). This EUA will remain in effect (meaning this test can be used) for the duration of the COVID-19 declaration under Section 564(b)(1) of the Act, 21 U.S.C. section 360bbb-3(b)(1), unless the authorization is terminated or revoked.  Performed at [facility], [place] [address] [place], [name] [address]    WBC 03/13/2021 12.7 (H)  4.0 - 10.5 K/uL Final   RBC 03/13/2021 5.19  4.22 - 5.81 MIL/uL Final   Hemoglobin 03/13/2021 15.7  13.0 - 17.0 g/dL Final   HCT 03/13/2021 46.9  39.0 - 52.0 % Final   MCV 03/13/2021 90.4  80.0 - 100.0 fL Final   MCH 03/13/2021 30.3  26.0 - 34.0 pg Final   MCHC 03/13/2021 33.5  30.0 - 36.0 g/dL Final   RDW 03/13/2021 13.6  11.5 - 15.5 % Final   Platelets 03/13/2021 228  150 - 400 K/uL Final   nRBC 03/13/2021 0.0  0.0 - 0.2 % Final   Neutrophils Relative % 03/13/2021 72  % Final   Neutro Abs 03/13/2021 9.1 (H)  1.7 - 7.7 K/uL Final   Lymphocytes Relative 03/13/2021 20  %  Final   Lymphs Abs 03/13/2021 2.5  0.7 - 4.0 K/uL Final   Monocytes Relative 03/13/2021 8  % Final   Monocytes Absolute 03/13/2021 1.0  0.1 - 1.0 K/uL Final   Eosinophils Relative 03/13/2021 0  % Final   Eosinophils Absolute 03/13/2021 0.0  0.0 - 0.5 K/uL Final   Basophils Relative 03/13/2021 0  % Final   Basophils Absolute 03/13/2021 0.0  0.0 - 0.1 K/uL Final    Immature Granulocytes 03/13/2021 0  % Final   Abs Immature Granulocytes 03/13/2021 0.05  0.00 - 0.07 K/uL Final   Performed at [facility], [place] [address], [place], [name] [address]   Sodium 03/13/2021 135  135 - 145 mmol/L Final   Potassium 03/13/2021 4.3  3.5 - 5.1 mmol/L Final   Chloride 03/13/2021 99  98 - 111 mmol/L Final   CO2 03/13/2021 25  22 - 32 mmol/L Final   Glucose, Bld 03/13/2021 77  70 - 99 mg/dL Final   Glucose reference range applies only to samples taken after fasting for at least 8 hours.   BUN 03/13/2021 28 (H)  6 - 20 mg/dL Final   Creatinine, Ser 03/13/2021 1.07  0.61 - 1.24 mg/dL Final   Calcium 03/13/2021 10.1  8.9 - 10.3 mg/dL Final   Total Protein 03/13/2021 8.4 (H)  6.5 - 8.1 g/dL Final   Albumin 03/13/2021 5.0  3.5 - 5.0 g/dL Final   AST 03/13/2021 409 (H)  15 - 41 U/L Final   ALT 03/13/2021 101 (H)  0 - 44 U/L Final   Alkaline Phosphatase 03/13/2021 103  38 - 126 U/L Final   Total Bilirubin 03/13/2021 0.7  0.3 - 1.2 mg/dL Final   GFR, Estimated 03/13/2021 >60  >60 mL/min Final   Comment: (NOTE) Calculated using the CKD-EPI Creatinine Equation (2021)    Anion gap 03/13/2021 11  5 - 15 Final   Performed at [facility] [address] [place], [name] [address]   Hgb A1c MFr Bld 03/13/2021 5.7 (H)  4.8 - 5.6 % Final   Comment: (NOTE) Pre diabetes:          5.7%-6.4%  Diabetes:              >6.4%  Glycemic control for   <7.0% adults with diabetes    Mean Plasma Glucose 03/13/2021 116.89  mg/dL Final   Performed at [facility], [place] [address], [place], [place] [address]   Alcohol, Ethyl (B) 03/13/2021 <10  <10 mg/dL Final   Comment: (NOTE) Lowest detectable limit for serum alcohol is 10 mg/dL.  For medical purposes only. Performed at [facility], [place] [address], [place], [place] [address]    TSH 03/13/2021 1.051  0.350 - 4.500 uIU/mL Final   Comment: Performed by a 3rd Generation assay with a functional sensitivity of  <=0.01 uIU/mL. Performed at [facility], [place] [address] [place], [name] [address]    POC Amphetamine UR 03/13/2021 None Detected  NONE DETECTED (Cut Off Level 1000 ng/mL) Final   POC Secobarbital (BAR) 03/13/2021 None Detected  NONE DETECTED (Cut Off Level 300 ng/mL) Final   POC Buprenorphine (BUP) 03/13/2021 None Detected  NONE DETECTED (Cut Off Level 10 ng/mL) Final   POC Oxazepam (BZO) 03/13/2021 None Detected  NONE DETECTED (Cut Off Level 300 ng/mL) Final   POC Cocaine UR 03/13/2021 Positive (A)  NONE DETECTED (Cut Off Level 300 ng/mL) Final   POC Methamphetamine UR  03/13/2021 None Detected  NONE DETECTED (Cut Off Level 1000 ng/mL) Final   POC Morphine 03/13/2021 None Detected  NONE DETECTED (Cut Off Level 300 ng/mL) Final   POC Oxycodone UR 03/13/2021 None Detected  NONE DETECTED (Cut Off Level 100 ng/mL) Final   POC Methadone UR 03/13/2021 None Detected  NONE DETECTED (Cut Off Level 300 ng/mL) Final   POC Marijuana UR 03/13/2021 None Detected  NONE DETECTED (Cut Off Level 50 ng/mL) Final   SARS Coronavirus 2 Ag 03/13/2021 Negative  Negative Final   SARSCOV2ONAVIRUS 2 AG 03/13/2021 NEGATIVE  NEGATIVE Final   Comment: (NOTE) SARS-CoV-2 antigen NOT DETECTED.   Negative results are presumptive.  Negative results do not preclude SARS-CoV-2 infection and should not be used as the sole basis for treatment or other patient management decisions, including infection  control decisions, particularly in the presence of clinical signs and  symptoms consistent with COVID-19, or in those who have been in contact with the virus.  Negative results must be combined with clinical observations, patient history, and epidemiological information. The expected result is Negative.  Fact Sheet for Patients: HandmadeRecipes.com.cy  Fact Sheet for Healthcare Providers: FuneralLife.at  This test is not yet approved or cleared by the [place] FDA  and  has been authorized for detection and/or diagnosis of SARS-CoV-2 by FDA under an Emergency Use Authorization (EUA).  This EUA will remain in effect (meaning this test can be used) for the duration of  the COV                          ID-19 declaration under Section 564(b)(1) of the Act, 21 U.S.C. section 360bbb-3(b)(1), unless the authorization is terminated or revoked sooner.     Cholesterol 03/13/2021 196  0 - 200 mg/dL Final   Triglycerides 03/13/2021 62  <150 mg/dL Final   HDL 03/13/2021 56  >40 mg/dL Final   Total CHOL/HDL Ratio 03/13/2021 3.5  RATIO Final   VLDL 03/13/2021 12  0 - 40 mg/dL Final   LDL Cholesterol 03/13/2021 128 (H)  0 - 99 mg/dL Final   Comment:        Total Cholesterol/HDL:CHD Risk Coronary Heart Disease Risk Table                     Men   Women  1/2 Average Risk   3.4   3.3  Average Risk       5.0   4.4  2 X Average Risk   9.6   7.1  3 X Average Risk  23.4   11.0        Use the calculated Patient Ratio above and the CHD Risk Table to determine the patient's CHD Risk.        ATP III CLASSIFICATION (LDL):  <100     mg/dL   Optimal  100-129  mg/dL   Near or Above                    Optimal  130-159  mg/dL   Borderline  160-189  mg/dL   High  >190     mg/dL   Very High Performed at [facility] [address], [place],  [address]    SARS Coronavirus 2 Ag 03/15/2021 Negative  Negative Final   SARSCOV2ONAVIRUS 2 AG 03/15/2021 NEGATIVE  NEGATIVE Final   Comment: (NOTE) SARS-CoV-2 antigen NOT DETECTED.   Negative results are presumptive.  Negative results  do not preclude SARS-CoV-2 infection and should not be used as the sole basis for treatment or other patient management decisions, including infection  control decisions, particularly in the presence of clinical signs and  symptoms consistent with COVID-19, or in those who have been in contact with the virus.  Negative results must be combined with clinical observations, patient  history, and epidemiological information. The expected result is Negative.  Fact Sheet for Patients: HandmadeRecipes.com.cy  Fact Sheet for Healthcare Providers: FuneralLife.at  This test is not yet approved or cleared by the [place] FDA and  has been authorized for detection and/or diagnosis of SARS-CoV-2 by FDA under an Emergency Use Authorization (EUA).  This EUA will remain in effect (meaning this test can be used) for the duration of  the COV                          ID-19 declaration under Section 564(b)(1) of the Act, 21 U.S.C. section 360bbb-3(b)(1), unless the authorization is terminated or revoked sooner.      Allergies: Penicillins, Pork-derived products, Latex, and Tape  PTA Medications: (Not in a hospital admission)   Medical Decision Making  Dene Nazir is a 56 year old male with a history of schizoaffective disorder depressive type, generalized anxiety disorder substance abuse, substance-induced mood disorder and suicidal ideation presenting with paranoia, and delusions.  Recommendations  Based on my evaluation the patient does not appear to have an emergency medical condition.Patient will be admitted to [facility] continuous assessment for crisis management, safety and stabilization.  Lucia Bitter, NP 08/28/21  7:32 AM

## 2021-08-28 NOTE — ED Notes (Signed)
Patient is currently sitting in bed quietly no distress noted, will continue to monitor patient for safety.

## 2021-08-28 NOTE — ED Notes (Signed)
Patient refused EKG he states he does not like doing them

## 2021-08-28 NOTE — ED Notes (Addendum)
Unable to obtain urine from patient at this time for UDS as he states he had already used the bathroom on himself prior to the assessment starting
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
## 2021-08-28 NOTE — ED Notes (Signed)
Patient sleeping with no signs of distress noted - will continue to monitor for safety 

## 2021-08-28 NOTE — ED Notes (Signed)
Pt resting at present, no distress noted, calm & cooperative.  Monitoring for safety.

## 2021-08-28 NOTE — ED Notes (Signed)
Pt was given roast beef, veggies, and juice for lunch.

## 2021-08-29 MED ORDER — PALIPERIDONE ER 6 MG PO TB24
6.0000 mg | ORAL_TABLET | Freq: Every day | ORAL | Status: DC
  Administered 2021-08-29: 6 mg via ORAL
  Filled 2021-08-29: qty 1

## 2021-08-29 MED ORDER — TRIPLE ANTIBIOTIC 3.5-400-5000 EX OINT
1.0000 | TOPICAL_OINTMENT | Freq: Two times a day (BID) | CUTANEOUS | Status: DC
Start: 2021-08-29 — End: 2021-08-30
  Administered 2021-08-29 – 2021-08-30 (×2): 1 via TOPICAL
  Filled 2021-08-29 (×2): qty 1

## 2021-08-29 NOTE — ED Notes (Signed)
Pt reports sore toe on right foot.  Middle toe underside has red area present.  Provider made aware.

## 2021-08-29 NOTE — ED Notes (Signed)
Pt provided breakfast.  Currently denies SI but reports he has passive thoughts without a plan.  Pt denies pain, HI, and AVH.  Breathing is even and unlabored. Will continue to monitor for safety.

## 2021-08-29 NOTE — ED Provider Notes (Signed)
Behavioral Health Progress Note  Date and Time: 08/29/2021 4:21 PM Name: Dylan [name] MRN:  [id number]  Subjective:   Dylan [name] is a 56 year old male with a past psychiatric history of schizoaffective disorder bipolar type, with multiple previous admissions.  The patient presented to the [facility] behavioral health urgent care voluntarily via the [facility] on 5/09 with suicidal thoughts.  On assessment this morning, the patient exhibits a linear and logical thought process.  He does not appear to be responding to internal stimuli.  However, he does endorse significant suicidal thoughts.  When asked for more details the patient says "no comment".  The patient reports that he could not sleep the night of 8/19.  He states "I kept hearing voices that kept me awake".  Because of this, he went walking, but he states he did not bring any shoes.  He states that he was walking barefoot on the street for some time before the [place] came.  He states that they had a friendly interaction and that they asked that he come to the [facility] behavioral health urgent care.  The patient reports living in an [place].  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  He reports that he has seen Dr. Kai Levins at the [facility].  He states that he has been medication noncompliant since July.    He reports that he seldom uses alcohol.  He states that he vapes nicotine frequently.  He states that he binges on crack periodically.  He states that this is a very destructive habit.  The patient reports significant suicidal thoughts.  He states that he would rather not be alive.  However, he refuses to disclose whether or not he has a plan to do anything to harm himself.  The patient denies experiencing any auditory or visual  hallucinations.   Diagnosis:  Final diagnoses:  Schizoaffective disorder, depressive type ([place])    Total Time spent with patient: 15 minutes  Past Psychiatric History: as above Past Medical History:  Past Medical History:  Diagnosis Date   Anxiety    Arthritis    Bipolar 1 disorder ([place])    Colon polyps    Depression    GERD (gastroesophageal reflux disease)    Hepatitis B    Human immunodeficiency virus (HIV) ([place])    Hyperlipidemia    Hypertension    Neuromuscular disorder ([place])    neuropathy   Neuropathy    Pre-diabetes    Schizophrenia ([place])     Past Surgical History:  Procedure Laterality Date   FOOT ARTHRODESIS Right 09/17/2019   Procedure: FUSION RIGHT LISFRANC JOINT;  Surgeon: Newt Minion, MD;  Location: [place];  Service: Orthopedics;  Laterality: Right;   FOOT ARTHRODESIS Right 09/2019   HEMORROIDECTOMY     IR FLUORO GUIDE CV LINE RIGHT  02/22/2017   IR US GUIDE VASC ACCESS RIGHT  02/22/2017   TUMOR REMOVAL     From Chest   Family History:  Family History  Problem Relation Age of Onset   CAD Mother    Diabetes Father    Colon cancer Maternal Aunt 60   Diabetes Maternal Aunt    Heart disease Maternal Uncle    Esophageal cancer Neg Hx    Rectal cancer Neg Hx    Stomach cancer Neg Hx  Family Psychiatric  History: as above Social History:  Social History   Substance and Sexual Activity  Alcohol Use Yes   Comment: occassional drinker     Social History   Substance and Sexual Activity  Drug Use Yes   Types: Cocaine   Comment: last use was Friday 03/05/2020    Social History   Socioeconomic History   Marital status: Married    Spouse name: Not on file   Number of children: 1   Years of education: Not on file   Highest education level: Not on file  Occupational History   Not on file  Tobacco Use   Smoking status: Every Day    Packs/day: 0.50    Years: 25.00    Total pack years: 12.50    Types: E-cigarettes, Cigarettes   Smokeless  tobacco: Never   Tobacco comments:    has patches when he is ready to quit  Vaping Use   Vaping Use: Every day   Start date: 12/09/2020   Substances: Nicotine  Substance and Sexual Activity   Alcohol use: Yes    Comment: occassional drinker   Drug use: Yes    Types: Cocaine    Comment: last use was Friday 03/05/2020   Sexual activity: Yes    Partners: Female    Comment: condoms given  Other Topics Concern   Not on file  Social History Narrative   Not on file   Social Determinants of Health   Financial Resource Strain: Not on file  Food Insecurity: Not on file  Transportation Needs: Not on file  Physical Activity: Not on file  Stress: Not on file  Social Connections: Not on file   SDOH:  SDOH Screenings   Alcohol Screen: Low Risk  (03/15/2021)   Alcohol Screen    Last Alcohol Screening Score (AUDIT): 3  Depression (PHQ2-9): Medium Risk (07/02/2021)   Depression (PHQ2-9)    PHQ-2 Score: 7  Financial Resource Strain: Not on file  Food Insecurity: Not on file  Housing: Not on file  Physical Activity: Not on file  Social Connections: Not on file  Stress: Not on file  Tobacco Use: High Risk (07/02/2021)   Patient History    Smoking Tobacco Use: Every Day    Smokeless Tobacco Use: Never    Passive Exposure: Not on file  Transportation Needs: Not on file   Additional Social History:    Pain Medications: See MAR Prescriptions: See MAR Over the Counter: See MAR History of alcohol / drug use?: Yes Longest period of sobriety (when/how long): 2- 3 months Negative Consequences of Use: Financial, Legal Withdrawal Symptoms: Aggressive/Assaultive, Agitation   Name of Substance 2: Cocaine 2 - Age of First Use: unknown 2 - Amount (size/oz): Varies 2 - Frequency: Approximately 2 times per month 2 - Duration: Ongoing 2 - Last Use / Amount: 08/26/2021 2 - Method of Aquiring: Friend 2 - Route of Substance Use: Inhalation Name of Substance 3: alcohol 3 - Age of First Use:  unknown 3 - Amount (size/oz): 1-2 shots of liquor 3 - Frequency: unknown 3 - Duration: ongoing 3 - Last Use / Amount: 08/26/2021 3 - Method of Aquiring: purchase 3 - Route of Substance Use: Oral ingestion              Sleep: Good  Appetite:  Fair  Current Medications:  Current Facility-Administered Medications  Medication Dose Route Frequency Provider Last Rate Last Admin   acetaminophen (TYLENOL) tablet 650 mg  650 mg Oral  Q6H PRN Bobbitt, Shalon E, NP       alum & mag hydroxide-simeth (MAALOX/MYLANTA) 200-200-20 MG/5ML suspension 30 mL  30 mL Oral Q4H PRN Bobbitt, Shalon E, NP       bictegravir-emtricitabine-tenofovir AF (BIKTARVY) 50-200-25 MG per tablet 1 tablet  1 tablet Oral Daily Bobbitt, Shalon E, NP   1 tablet at 08/29/21 5284   gabapentin (NEURONTIN) capsule 300 mg  300 mg Oral TID Bobbitt, Shalon E, NP   300 mg at 08/29/21 1324   hydrOXYzine (ATARAX) tablet 25 mg  25 mg Oral TID PRN Bobbitt, Shalon E, NP   25 mg at 08/28/21 0932   OLANZapine zydis (ZYPREXA) disintegrating tablet 5 mg  5 mg Oral Q8H PRN Bobbitt, Shalon E, NP       And   LORazepam (ATIVAN) tablet 1 mg  1 mg Oral PRN Bobbitt, Shalon E, NP       And   ziprasidone (GEODON) injection 20 mg  20 mg Intramuscular PRN Bobbitt, Shalon E, NP       magnesium hydroxide (MILK OF MAGNESIA) suspension 30 mL  30 mL Oral Daily PRN Bobbitt, Shalon E, NP       paliperidone (INVEGA) 24 hr tablet 6 mg  6 mg Oral QHS Corky Sox, MD       rosuvastatin (CRESTOR) tablet 10 mg  10 mg Oral Daily Bobbitt, Shalon E, NP   10 mg at 08/29/21 0925   traZODone (DESYREL) tablet 100 mg  100 mg Oral QHS Bobbitt, Shalon E, NP   100 mg at 08/28/21 2137   traZODone (DESYREL) tablet 50 mg  50 mg Oral QHS PRN Bobbitt, Shalon E, NP       Current Outpatient Medications  Medication Sig Dispense Refill   bictegravir-emtricitabine-tenofovir AF (BIKTARVY) 50-200-25 MG TABS tablet Take 1 tablet by mouth daily. 30 tablet 5   gabapentin  (NEURONTIN) 300 MG capsule Take 1 capsule (300 mg total) by mouth 3 (three) times daily. 90 capsule 0   hydrOXYzine (ATARAX) 25 MG tablet Take 1 tablet (25 mg total) by mouth 3 (three) times daily as needed for anxiety. 30 tablet 0   mupirocin ointment (BACTROBAN) 2 % Apply 1 Application topically daily. 22 g 0   QUEtiapine (SEROQUEL XR) 50 MG TB24 24 hr tablet Take 50 mg by mouth daily.     rosuvastatin (CRESTOR) 10 MG tablet Take 1 tablet (10 mg total) by mouth daily. 90 tablet 1   traZODone (DESYREL) 100 MG tablet Take 1 tablet (100 mg total) by mouth at bedtime. 30 tablet 0    Labs  Lab Results:  Admission on 08/28/2021  Component Date Value Ref Range Status   SARS Coronavirus 2 by RT PCR 08/28/2021 NEGATIVE  NEGATIVE Final   Comment: (NOTE) SARS-CoV-2 target nucleic acids are NOT DETECTED.  The SARS-CoV-2 RNA is generally detectable in upper respiratory specimens during the acute phase of infection. The lowest concentration of SARS-CoV-2 viral copies this assay can detect is 138 copies/mL. A negative result does not preclude SARS-Cov-2 infection and should not be used as the sole basis for treatment or other patient management decisions. A negative result may occur with  improper specimen collection/handling, submission of specimen other than nasopharyngeal swab, presence of viral mutation(s) within the areas targeted by this assay, and inadequate number of viral copies(<138 copies/mL). A negative result must be combined with clinical observations, patient history, and epidemiological information. The expected result is Negative.  Fact Sheet for Patients:  EntrepreneurPulse.com.au  Fact Sheet for Healthcare Providers:  IncredibleEmployment.be  This test is no                          t yet approved or cleared by the [place] FDA and  has been authorized for detection and/or diagnosis of SARS-CoV-2 by FDA under an Emergency Use  Authorization (EUA). This EUA will remain  in effect (meaning this test can be used) for the duration of the COVID-19 declaration under Section 564(b)(1) of the Act, 21 U.S.C.section 360bbb-3(b)(1), unless the authorization is terminated  or revoked sooner.       Influenza A by PCR 08/28/2021 NEGATIVE  NEGATIVE Final   Influenza B by PCR 08/28/2021 NEGATIVE  NEGATIVE Final   Comment: (NOTE) The Xpert Xpress SARS-CoV-2/FLU/RSV plus assay is intended as an aid in the diagnosis of influenza from Nasopharyngeal swab specimens and should not be used as a sole basis for treatment. Nasal washings and aspirates are unacceptable for Xpert Xpress SARS-CoV-2/FLU/RSV testing.  Fact Sheet for Patients: EntrepreneurPulse.com.au  Fact Sheet for Healthcare Providers: IncredibleEmployment.be  This test is not yet approved or cleared by the [place] FDA and has been authorized for detection and/or diagnosis of SARS-CoV-2 by FDA under an Emergency Use Authorization (EUA). This EUA will remain in effect (meaning this test can be used) for the duration of the COVID-19 declaration under Section 564(b)(1) of the Act, 21 U.S.C. section 360bbb-3(b)(1), unless the authorization is terminated or revoked.  Performed at [facility], [place] [address], [place], [name] [address]    WBC 08/28/2021 11.2 (H)  4.0 - 10.5 K/uL Final   RBC 08/28/2021 4.65  4.22 - 5.81 MIL/uL Final   Hemoglobin 08/28/2021 14.3  13.0 - 17.0 g/dL Final   HCT 08/28/2021 42.6  39.0 - 52.0 % Final   MCV 08/28/2021 91.6  80.0 - 100.0 fL Final   MCH 08/28/2021 30.8  26.0 - 34.0 pg Final   MCHC 08/28/2021 33.6  30.0 - 36.0 g/dL Final   RDW 08/28/2021 13.2  11.5 - 15.5 % Final   Platelets 08/28/2021 219  150 - 400 K/uL Final   nRBC 08/28/2021 0.0  0.0 - 0.2 % Final   Neutrophils Relative % 08/28/2021 71  % Final   Neutro Abs 08/28/2021 7.8 (H)  1.7 - 7.7 K/uL Final   Lymphocytes Relative  08/28/2021 21  % Final   Lymphs Abs 08/28/2021 2.4  0.7 - 4.0 K/uL Final   Monocytes Relative 08/28/2021 8  % Final   Monocytes Absolute 08/28/2021 0.9  0.1 - 1.0 K/uL Final   Eosinophils Relative 08/28/2021 0  % Final   Eosinophils Absolute 08/28/2021 0.0  0.0 - 0.5 K/uL Final   Basophils Relative 08/28/2021 0  % Final   Basophils Absolute 08/28/2021 0.0  0.0 - 0.1 K/uL Final   Immature Granulocytes 08/28/2021 0  % Final   Abs Immature Granulocytes 08/28/2021 0.04  0.00 - 0.07 K/uL Final   Performed at [facility], [place] [address], [place], [name] [address]   Sodium 08/28/2021 137  135 - 145 mmol/L Final   Potassium 08/28/2021 3.8  3.5 - 5.1 mmol/L Final   Chloride 08/28/2021 102  98 - 111 mmol/L Final   CO2 08/28/2021 27  22 - 32 mmol/L Final   Glucose, Bld 08/28/2021 79  70 - 99 mg/dL Final   Glucose reference range applies only to samples taken after fasting for at least  8 hours.   BUN 08/28/2021 14  6 - 20 mg/dL Final   Creatinine, Ser 08/28/2021 1.10  0.61 - 1.24 mg/dL Final   Calcium 08/28/2021 9.7  8.9 - 10.3 mg/dL Final   Total Protein 08/28/2021 7.7  6.5 - 8.1 g/dL Final   Albumin 08/28/2021 4.4  3.5 - 5.0 g/dL Final   AST 08/28/2021 38  15 - 41 U/L Final   ALT 08/28/2021 40  0 - 44 U/L Final   Alkaline Phosphatase 08/28/2021 83  38 - 126 U/L Final   Total Bilirubin 08/28/2021 0.8  0.3 - 1.2 mg/dL Final   GFR, Estimated 08/28/2021 >60  >60 mL/min Final   Comment: (NOTE) Calculated using the CKD-EPI Creatinine Equation (2021)    Anion gap 08/28/2021 8  5 - 15 Final   Performed at [facility], [place] [address], [place], [place] [address]   SARSCOV2ONAVIRUS 2 AG 08/28/2021 NEGATIVE  NEGATIVE Final   Comment: (NOTE) SARS-CoV-2 antigen NOT DETECTED.   Negative results are presumptive.  Negative results do not preclude SARS-CoV-2 infection and should not be used as the sole basis for treatment or other patient management decisions, including infection  control  decisions, particularly in the presence of clinical signs and  symptoms consistent with COVID-19, or in those who have been in contact with the virus.  Negative results must be combined with clinical observations, patient history, and epidemiological information. The expected result is Negative.  Fact Sheet for Patients: HandmadeRecipes.com.cy  Fact Sheet for Healthcare Providers: FuneralLife.at  This test is not yet approved or cleared by the [place] FDA and  has been authorized for detection and/or diagnosis of SARS-CoV-2 by FDA under an Emergency Use Authorization (EUA).  This EUA will remain in effect (meaning this test can be used) for the duration of  the COV                          ID-19 declaration under Section 564(b)(1) of the Act, 21 U.S.C. section 360bbb-3(b)(1), unless the authorization is terminated or revoked sooner.     Cholesterol 08/28/2021 244 (H)  0 - 200 mg/dL Final   Triglycerides 08/28/2021 65  <150 mg/dL Final   HDL 08/28/2021 51  >40 mg/dL Final   Total CHOL/HDL Ratio 08/28/2021 4.8  RATIO Final   VLDL 08/28/2021 13  0 - 40 mg/dL Final   LDL Cholesterol 08/28/2021 180 (H)  0 - 99 mg/dL Final   Comment:        Total Cholesterol/HDL:CHD Risk Coronary Heart Disease Risk Table                     Men   Women  1/2 Average Risk   3.4   3.3  Average Risk       5.0   4.4  2 X Average Risk   9.6   7.1  3 X Average Risk  23.4   11.0        Use the calculated Patient Ratio above and the CHD Risk Table to determine the patient's CHD Risk.        ATP III CLASSIFICATION (LDL):  <100     mg/dL   Optimal  100-129  mg/dL   Near or Above                    Optimal  130-159  mg/dL   Borderline  160-189  mg/dL  High  >190     mg/dL   Very High Performed at [place] [address], [place], [place] [address]   Admission on 07/02/2021, Discharged on 07/05/2021  Component Date Value Ref Range Status    Sodium 07/02/2021 137  135 - 145 mmol/L Final   Potassium 07/02/2021 3.5  3.5 - 5.1 mmol/L Final   Chloride 07/02/2021 102  98 - 111 mmol/L Final   CO2 07/02/2021 25  22 - 32 mmol/L Final   Glucose, Bld 07/02/2021 104 (H)  70 - 99 mg/dL Final   Glucose reference range applies only to samples taken after fasting for at least 8 hours.   BUN 07/02/2021 22 (H)  6 - 20 mg/dL Final   Creatinine, Ser 07/02/2021 1.26 (H)  0.61 - 1.24 mg/dL Final   Calcium 07/02/2021 9.3  8.9 - 10.3 mg/dL Final   Total Protein 07/02/2021 6.9  6.5 - 8.1 g/dL Final   Albumin 07/02/2021 3.9  3.5 - 5.0 g/dL Final   AST 07/02/2021 457 (H)  15 - 41 U/L Final   ALT 07/02/2021 165 (H)  0 - 44 U/L Final   Alkaline Phosphatase 07/02/2021 91  38 - 126 U/L Final   Total Bilirubin 07/02/2021 1.0  0.3 - 1.2 mg/dL Final   GFR, Estimated 07/02/2021 >60  >60 mL/min Final   Comment: (NOTE) Calculated using the CKD-EPI Creatinine Equation (2021)    Anion gap 07/02/2021 10  5 - 15 Final   Performed at [facility], [place] [address], [place], [place] [address]   Alcohol, Ethyl (B) 07/02/2021 <10  <10 mg/dL Final   Comment: (NOTE) Lowest detectable limit for serum alcohol is 10 mg/dL.  For medical purposes only. Performed at [facility], [place] [address] [place], [name] [address]    Salicylate Lvl 67/67/2094 <7.0 (L)  7.0 - 30.0 mg/dL Final   Performed at [facility] [address] [place], [name] [address]   Acetaminophen (Tylenol), Serum 07/02/2021 <10 (L)  10 - 30 ug/mL Final   Comment: (NOTE) Therapeutic concentrations vary significantly. A range of 10-30 ug/mL  may be an effective concentration for many patients. However, some  are best treated at concentrations outside of this range. Acetaminophen concentrations >150 ug/mL at 4 hours after ingestion  and >50 ug/mL at 12 hours after ingestion are often associated with  toxic reactions.  Performed at [facility], [place] [address]  [place], [name] [address]    WBC 07/02/2021 6.6  4.0 - 10.5 K/uL Final   RBC 07/02/2021 4.65  4.22 - 5.81 MIL/uL Final   Hemoglobin 07/02/2021 14.3  13.0 - 17.0 g/dL Final   HCT 07/02/2021 42.4  39.0 - 52.0 % Final   MCV 07/02/2021 91.2  80.0 - 100.0 fL Final   MCH 07/02/2021 30.8  26.0 - 34.0 pg Final   MCHC 07/02/2021 33.7  30.0 - 36.0 g/dL Final   RDW 07/02/2021 12.8  11.5 - 15.5 % Final   Platelets 07/02/2021 197  150 - 400 K/uL Final   nRBC 07/02/2021 0.0  0.0 - 0.2 % Final   Performed at [facility] [address], [place], [name] [address]   Opiates 07/02/2021 NONE DETECTED  NONE DETECTED Final   Cocaine 07/02/2021 POSITIVE (A)  NONE DETECTED Final   Benzodiazepines 07/02/2021 NONE DETECTED  NONE DETECTED Final   Amphetamines 07/02/2021 NONE DETECTED  NONE DETECTED Final   Tetrahydrocannabinol 07/02/2021 NONE  DETECTED  NONE DETECTED Final   Barbiturates 07/02/2021 NONE DETECTED  NONE DETECTED Final   Comment: (NOTE) DRUG SCREEN FOR MEDICAL PURPOSES ONLY.  IF CONFIRMATION IS NEEDED FOR ANY PURPOSE, NOTIFY LAB WITHIN 5 DAYS.  LOWEST DETECTABLE LIMITS FOR URINE DRUG SCREEN Drug Class                     Cutoff (ng/mL) Amphetamine and metabolites    1000 Barbiturate and metabolites    200 Benzodiazepine                 200 Tricyclics and metabolites     300 Opiates and metabolites        300 Cocaine and metabolites        300 THC                            50 Performed at [facility], [address] [place], [place] [address]    Total CK 07/03/2021 19,220 (H)  49 - 397 U/L Final   Comment: RESULTS CONFIRMED BY MANUAL DILUTION Performed at [facility], [address], [place], [place] [address]    Sodium 07/03/2021 135  135 - 145 mmol/L Final   Potassium 07/03/2021 3.8  3.5 - 5.1 mmol/L Final   Chloride 07/03/2021 105  98 - 111 mmol/L Final   CO2 07/03/2021 23  22 - 32 mmol/L Final   Glucose, Bld 07/03/2021 114 (H)  70 - 99 mg/dL Final   Glucose  reference range applies only to samples taken after fasting for at least 8 hours.   BUN 07/03/2021 21 (H)  6 - 20 mg/dL Final   Creatinine, Ser 07/03/2021 1.10  0.61 - 1.24 mg/dL Final   Calcium 26/99/8837 8.8 (L)  8.9 - 10.3 mg/dL Final   Total Protein 11/10/1756 6.5  6.5 - 8.1 g/dL Final   Albumin 17/99/7970 3.4 (L)  3.5 - 5.0 g/dL Final   AST 62/06/7938 349 (H)  15 - 41 U/L Final   ALT 07/03/2021 149 (H)  0 - 44 U/L Final   Alkaline Phosphatase 07/03/2021 77  38 - 126 U/L Final   Total Bilirubin 07/03/2021 0.6  0.3 - 1.2 mg/dL Final   GFR, Estimated 07/03/2021 >60  >60 mL/min Final   Comment: (NOTE) Calculated using the CKD-EPI Creatinine Equation (2021)    Anion gap 07/03/2021 7  5 - 15 Final   Performed at [facility], [address] [place], [place] [address]   Sodium 07/04/2021 139  135 - 145 mmol/L Final   Potassium 07/04/2021 3.7  3.5 - 5.1 mmol/L Final   Chloride 07/04/2021 106  98 - 111 mmol/L Final   CO2 07/04/2021 26  22 - 32 mmol/L Final   Glucose, Bld 07/04/2021 121 (H)  70 - 99 mg/dL Final   Glucose reference range applies only to samples taken after fasting for at least 8 hours.   BUN 07/04/2021 12  6 - 20 mg/dL Final   Creatinine, Ser 07/04/2021 1.04  0.61 - 1.24 mg/dL Final   Calcium 12/35/6268 9.0  8.9 - 10.3 mg/dL Final   Total Protein 53/95/9206 5.6 (L)  6.5 - 8.1 g/dL Final   Albumin 46/31/8081 3.0 (L)  3.5 - 5.0 g/dL Final   AST 38/40/2011 194 (H)  15 - 41 U/L Final   ALT 07/04/2021 123 (H)  0 - 44 U/L Final  Alkaline Phosphatase 07/04/2021 69  38 - 126 U/L Final   Total Bilirubin 07/04/2021 0.5  0.3 - 1.2 mg/dL Final   GFR, Estimated 07/04/2021 >60  >60 mL/min Final   Comment: (NOTE) Calculated using the CKD-EPI Creatinine Equation (2021)    Anion gap 07/04/2021 7  5 - 15 Final   Performed at [facility] [address] [place], [name] [address]   Total CK 07/04/2021 8,621 (H)  49 - 397 U/L Final   Comment: RESULTS CONFIRMED BY MANUAL  DILUTION Performed at [facility], [place] [address] [place], [name] [address]    Sodium 07/05/2021 138  135 - 145 mmol/L Final   Potassium 07/05/2021 3.9  3.5 - 5.1 mmol/L Final   Chloride 07/05/2021 104  98 - 111 mmol/L Final   CO2 07/05/2021 26  22 - 32 mmol/L Final   Glucose, Bld 07/05/2021 105 (H)  70 - 99 mg/dL Final   Glucose reference range applies only to samples taken after fasting for at least 8 hours.   BUN 07/05/2021 10  6 - 20 mg/dL Final   Creatinine, Ser 07/05/2021 1.05  0.61 - 1.24 mg/dL Final   Calcium 07/05/2021 9.2  8.9 - 10.3 mg/dL Final   Total Protein 07/05/2021 5.8 (L)  6.5 - 8.1 g/dL Final   Albumin 07/05/2021 3.1 (L)  3.5 - 5.0 g/dL Final   AST 07/05/2021 122 (H)  15 - 41 U/L Final   ALT 07/05/2021 113 (H)  0 - 44 U/L Final   Alkaline Phosphatase 07/05/2021 73  38 - 126 U/L Final   Total Bilirubin 07/05/2021 0.5  0.3 - 1.2 mg/dL Final   GFR, Estimated 07/05/2021 >60  >60 mL/min Final   Comment: (NOTE) Calculated using the CKD-EPI Creatinine Equation (2021)    Anion gap 07/05/2021 8  5 - 15 Final   Performed at [facility], [place] [address], [place], [name] [address]   Total CK 07/05/2021 3,647 (H)  49 - 397 U/L Final   Performed at [facility] [address], [place], [place] [address]  Admission on 07/02/2021, Discharged on 07/02/2021  Component Date Value Ref Range Status   SARS Coronavirus 2 by RT PCR 07/02/2021 NEGATIVE  NEGATIVE Final   Comment: (NOTE) SARS-CoV-2 target nucleic acids are NOT DETECTED.  The SARS-CoV-2 RNA is generally detectable in upper respiratory specimens during the acute phase of infection. The lowest concentration of SARS-CoV-2 viral copies this assay can detect is 138 copies/mL. A negative result does not preclude SARS-Cov-2 infection and should not be used as the sole basis for treatment or other patient management decisions. A negative result may occur with  improper specimen collection/handling,  submission of specimen other than nasopharyngeal swab, presence of viral mutation(s) within the areas targeted by this assay, and inadequate number of viral copies(<138 copies/mL). A negative result must be combined with clinical observations, patient history, and epidemiological information. The expected result is Negative.  Fact Sheet for Patients:  EntrepreneurPulse.com.au  Fact Sheet for Healthcare Providers:  IncredibleEmployment.be  This test is no                          t yet approved or cleared by the [place] FDA and  has been authorized for detection and/or diagnosis of SARS-CoV-2 by FDA under an Emergency Use Authorization (EUA). This EUA will remain  in effect (meaning this test can be used) for the  duration of the COVID-19 declaration under Section 564(b)(1) of the Act, 21 U.S.C.section 360bbb-3(b)(1), unless the authorization is terminated  or revoked sooner.       Influenza A by PCR 07/02/2021 NEGATIVE  NEGATIVE Final   Influenza B by PCR 07/02/2021 NEGATIVE  NEGATIVE Final   Comment: (NOTE) The Xpert Xpress SARS-CoV-2/FLU/RSV plus assay is intended as an aid in the diagnosis of influenza from Nasopharyngeal swab specimens and should not be used as a sole basis for treatment. Nasal washings and aspirates are unacceptable for Xpert Xpress SARS-CoV-2/FLU/RSV testing.  Fact Sheet for Patients: EntrepreneurPulse.com.au  Fact Sheet for Healthcare Providers: IncredibleEmployment.be  This test is not yet approved or cleared by the [place] FDA and has been authorized for detection and/or diagnosis of SARS-CoV-2 by FDA under an Emergency Use Authorization (EUA). This EUA will remain in effect (meaning this test can be used) for the duration of the COVID-19 declaration under Section 564(b)(1) of the Act, 21 U.S.C. section 360bbb-3(b)(1), unless the authorization is terminated  or revoked.  Performed at [facility], [place] [address] [place], [name] [address]    WBC 07/02/2021 10.1  4.0 - 10.5 K/uL Final   RBC 07/02/2021 4.98  4.22 - 5.81 MIL/uL Final   Hemoglobin 07/02/2021 15.4  13.0 - 17.0 g/dL Final   HCT 07/02/2021 45.0  39.0 - 52.0 % Final   MCV 07/02/2021 90.4  80.0 - 100.0 fL Final   MCH 07/02/2021 30.9  26.0 - 34.0 pg Final   MCHC 07/02/2021 34.2  30.0 - 36.0 g/dL Final   RDW 07/02/2021 12.6  11.5 - 15.5 % Final   Platelets 07/02/2021 147 (L)  150 - 400 K/uL Final   nRBC 07/02/2021 0.0  0.0 - 0.2 % Final   Neutrophils Relative % 07/02/2021 72  % Final   Neutro Abs 07/02/2021 7.3  1.7 - 7.7 K/uL Final   Lymphocytes Relative 07/02/2021 20  % Final   Lymphs Abs 07/02/2021 2.0  0.7 - 4.0 K/uL Final   Monocytes Relative 07/02/2021 7  % Final   Monocytes Absolute 07/02/2021 0.7  0.1 - 1.0 K/uL Final   Eosinophils Relative 07/02/2021 1  % Final   Eosinophils Absolute 07/02/2021 0.1  0.0 - 0.5 K/uL Final   Basophils Relative 07/02/2021 0  % Final   Basophils Absolute 07/02/2021 0.0  0.0 - 0.1 K/uL Final   Immature Granulocytes 07/02/2021 0  % Final   Abs Immature Granulocytes 07/02/2021 0.02  0.00 - 0.07 K/uL Final   Performed at [facility], [place] [address] [place], [name] [address]   Sodium 07/02/2021 135  135 - 145 mmol/L Final   Potassium 07/02/2021 3.6  3.5 - 5.1 mmol/L Final   Chloride 07/02/2021 102  98 - 111 mmol/L Final   CO2 07/02/2021 24  22 - 32 mmol/L Final   Glucose, Bld 07/02/2021 101 (H)  70 - 99 mg/dL Final   Glucose reference range applies only to samples taken after fasting for at least 8 hours.   BUN 07/02/2021 17  6 - 20 mg/dL Final   Creatinine, Ser 07/02/2021 1.12  0.61 - 1.24 mg/dL Final   Calcium 07/02/2021 9.7  8.9 - 10.3 mg/dL Final   Total Protein 07/02/2021 8.0  6.5 - 8.1 g/dL Final   Albumin 07/02/2021 4.5  3.5 - 5.0 g/dL Final   AST 07/02/2021 499 (H)  15 - 41 U/L Final   ALT 07/02/2021 159 (H)  0 - 44  U/L Final   Alkaline Phosphatase 07/02/2021 103  38 - 126 U/L Final   Total Bilirubin 07/02/2021 0.8  0.3 - 1.2 mg/dL Final   GFR, Estimated 07/02/2021 >60  >60 mL/min Final   Comment: (NOTE) Calculated using the CKD-EPI Creatinine Equation (2021)    Anion gap 07/02/2021 9  5 - 15 Final   Performed at [place] [address], [place], [place] [address]   Alcohol, Ethyl (B) 07/02/2021 <10  <10 mg/dL Final   Comment: (NOTE) Lowest detectable limit for serum alcohol is 10 mg/dL.  For medical purposes only. Performed at [facility], [place] [address] [place], [place] [address]    SARSCOV2ONAVIRUS 2 AG 07/02/2021 NEGATIVE  NEGATIVE Final   Comment: (NOTE) SARS-CoV-2 antigen NOT DETECTED.   Negative results are presumptive.  Negative results do not preclude SARS-CoV-2 infection and should not be used as the sole basis for treatment or other patient management decisions, including infection  control decisions, particularly in the presence of clinical signs and  symptoms consistent with COVID-19, or in those who have been in contact with the virus.  Negative results must be combined with clinical observations, patient history, and epidemiological information. The expected result is Negative.  Fact Sheet for Patients: HandmadeRecipes.com.cy  Fact Sheet for Healthcare Providers: FuneralLife.at  This test is not yet approved or cleared by the [place] FDA and  has been authorized for detection and/or diagnosis of SARS-CoV-2 by FDA under an Emergency Use Authorization (EUA).  This EUA will remain in effect (meaning this test can be used) for the duration of  the COV                          ID-19 declaration under Section 564(b)(1) of the Act, 21 U.S.C. section 360bbb-3(b)(1), unless the authorization is terminated or revoked sooner.    Appointment on 04/26/2021  Component Date Value Ref Range Status   Glucose  04/26/2021 101 (H)  70 - 99 mg/dL Final   BUN 04/26/2021 17  6 - 24 mg/dL Final   Creatinine, Ser 04/26/2021 0.99  0.76 - 1.27 mg/dL Final   eGFR 04/26/2021 90  >59 mL/min/1.73 Final   BUN/Creatinine Ratio 04/26/2021 17  9 - 20 Final   Sodium 04/26/2021 138  134 - 144 mmol/L Final   Potassium 04/26/2021 4.4  3.5 - 5.2 mmol/L Final   Chloride 04/26/2021 101  96 - 106 mmol/L Final   CO2 04/26/2021 23  20 - 29 mmol/L Final   Calcium 04/26/2021 9.8  8.7 - 10.2 mg/dL Final   Total Protein 04/26/2021 7.6  6.0 - 8.5 g/dL Final   Albumin 04/26/2021 4.8  3.8 - 4.9 g/dL Final   Globulin, Total 04/26/2021 2.8  1.5 - 4.5 g/dL Final   Albumin/Globulin Ratio 04/26/2021 1.7  1.2 - 2.2 Final   Bilirubin Total 04/26/2021 0.2  0.0 - 1.2 mg/dL Final   Alkaline Phosphatase 04/26/2021 116  44 - 121 IU/L Final   AST 04/26/2021 31  0 - 40 IU/L Final   ALT 04/26/2021 39  0 - 44 IU/L Final   A-1 Antitrypsin 04/26/2021 135  101 - 187 mg/dL Final   Hepatitis B Surface Ag 04/26/2021 CANCELED   Final-Edited   Comment: [facility] was unable to collect sufficient specimen to perform the following test(s), and is providing the patient with re-collection instructions.  Result canceled by the ancillary.    Hep A IgM 04/26/2021 CANCELED  Final-Edited   Comment: [facility] was unable to collect sufficient specimen to perform the following test(s), and is providing the patient with re-collection instructions.  Result canceled by the ancillary.    hep A Total Ab 04/26/2021 CANCELED   Final-Edited   Comment: [facility] was unable to collect sufficient specimen to perform the following test(s), and is providing the patient with re-collection instructions.  Result canceled by the ancillary.    GGT 04/26/2021 43  0 - 65 IU/L Final   Mitochondrial Ab 04/26/2021 <20.0  0.0 - 20.0 Units Final   Comment:                                 Negative    0.0 - 20.0                                 Equivocal  20.1 - 24.9                                  Positive         >24.9 Mitochondrial (M2) Antibodies are found in 90-96% of patients with primary biliary cirrhosis.    Ceruloplasmin 04/26/2021 21.3  16.0 - 31.0 mg/dL Final  Admission on 03/15/2021, Discharged on 03/21/2021  Component Date Value Ref Range Status   Total Protein 03/17/2021 7.7  6.5 - 8.1 g/dL Final   Albumin 03/17/2021 4.3  3.5 - 5.0 g/dL Final   AST 03/17/2021 81 (H)  15 - 41 U/L Final   ALT 03/17/2021 104 (H)  0 - 44 U/L Final   Alkaline Phosphatase 03/17/2021 90  38 - 126 U/L Final   Total Bilirubin 03/17/2021 0.3  0.3 - 1.2 mg/dL Final   Bilirubin, Direct 03/17/2021 0.1  0.0 - 0.2 mg/dL Final   Indirect Bilirubin 03/17/2021 0.2 (L)  0.3 - 0.9 mg/dL Final   Performed at [facility], [place] [address] [place], [place] [address]   Hepatitis B Surface Ag 03/17/2021 Reactive (A)  NON REACTIVE Final   Comment: Sample Positive for HBsAg. Result confirmed by neutralization. HEALTH DEPARTMENT NOTIFIED    HCV Ab 03/17/2021 NON REACTIVE  NON REACTIVE Final   Comment: (NOTE) Nonreactive HCV antibody screen is consistent with no HCV infections,  unless recent infection is suspected or other evidence exists to indicate HCV infection.     Hep A IgM 03/17/2021 NON REACTIVE  NON REACTIVE Final   Hep B C IgM 03/17/2021 NON REACTIVE  NON REACTIVE Final   Performed at [facility], [place] [address], [place], [name] [address]   Lipase 03/17/2021 62 (H)  11 - 51 U/L Final   Performed at [facility], [place] [address], [place], [place] [address]   Cholesterol 03/17/2021 189  0 - 200 mg/dL Final   Triglycerides 03/17/2021 349 (H)  <150 mg/dL Final   HDL 03/17/2021 44  >40 mg/dL Final   Total CHOL/HDL Ratio 03/17/2021 4.3  RATIO Final   VLDL 03/17/2021 70 (H)  0 - 40 mg/dL Final   LDL Cholesterol 03/17/2021 75  0 - 99 mg/dL Final   Comment:        Total Cholesterol/HDL:CHD Risk Coronary Heart Disease Risk Table  Men   Women  1/2 Average Risk   3.4   3.3  Average Risk       5.0   4.4  2 X Average Risk   9.6   7.1  3 X Average Risk  23.4   11.0        Use the calculated Patient Ratio above and the CHD Risk Table to determine the patient's CHD Risk.        ATP III CLASSIFICATION (LDL):  <100     mg/dL   Optimal  100-129  mg/dL   Near or Above                    Optimal  130-159  mg/dL   Borderline  160-189  mg/dL   High  >190     mg/dL   Very High Performed at [place] [address] [place], [name] [address]    RPR Ser Ql 03/17/2021 NON REACTIVE  NON REACTIVE Final   Performed at [facility], [place] [address] [place], [place] [address]   Vitamin B-12 03/17/2021 287  180 - 914 pg/mL Final   Comment: (NOTE) This assay is not validated for testing neonatal or myeloproliferative syndrome specimens for Vitamin B12 levels. Performed at [facility], [place] [address] [place], [name] [address]    WBC 03/17/2021 5.2  4.0 - 10.5 K/uL Final   RBC 03/17/2021 4.73  4.22 - 5.81 MIL/uL Final   Hemoglobin 03/17/2021 14.4  13.0 - 17.0 g/dL Final   HCT 03/17/2021 43.6  39.0 - 52.0 % Final   MCV 03/17/2021 92.2  80.0 - 100.0 fL Final   MCH 03/17/2021 30.4  26.0 - 34.0 pg Final   MCHC 03/17/2021 33.0  30.0 - 36.0 g/dL Final   RDW 03/17/2021 13.2  11.5 - 15.5 % Final   Platelets 03/17/2021 200  150 - 400 K/uL Final   nRBC 03/17/2021 0.0  0.0 - 0.2 % Final   Neutrophils Relative % 03/17/2021 40  % Final   Neutro Abs 03/17/2021 2.1  1.7 - 7.7 K/uL Final   Lymphocytes Relative 03/17/2021 48  % Final   Lymphs Abs 03/17/2021 2.6  0.7 - 4.0 K/uL Final   Monocytes Relative 03/17/2021 7  % Final   Monocytes Absolute 03/17/2021 0.4  0.1 - 1.0 K/uL Final   Eosinophils Relative 03/17/2021 4  % Final   Eosinophils Absolute 03/17/2021 0.2  0.0 - 0.5 K/uL Final   Basophils Relative 03/17/2021 1  % Final   Basophils Absolute 03/17/2021 0.0  0.0 - 0.1 K/uL  Final   Immature Granulocytes 03/17/2021 0  % Final   Abs Immature Granulocytes 03/17/2021 0.01  0.00 - 0.07 K/uL Final   Performed at [facility], [place] [address] [place], [name] [address]   Sodium 03/19/2021 137  135 - 145 mmol/L Final   Potassium 03/19/2021 4.4  3.5 - 5.1 mmol/L Final   Chloride 03/19/2021 104  98 - 111 mmol/L Final   CO2 03/19/2021 24  22 - 32 mmol/L Final   Glucose, Bld 03/19/2021 103 (H)  70 - 99 mg/dL Final   Glucose reference range applies only to samples taken after fasting for at least 8 hours.   BUN 03/19/2021 15  6 - 20 mg/dL Final   Creatinine, Ser 03/19/2021 1.12  0.61 - 1.24 mg/dL Final   Calcium 03/19/2021 9.2  8.9 - 10.3 mg/dL Final   Total Protein  03/19/2021 7.3  6.5 - 8.1 g/dL Final   Albumin 03/19/2021 4.0  3.5 - 5.0 g/dL Final   AST 03/19/2021 45 (H)  15 - 41 U/L Final   ALT 03/19/2021 79 (H)  0 - 44 U/L Final   Alkaline Phosphatase 03/19/2021 78  38 - 126 U/L Final   Total Bilirubin 03/19/2021 0.4  0.3 - 1.2 mg/dL Final   GFR, Estimated 03/19/2021 >60  >60 mL/min Final   Comment: (NOTE) Calculated using the CKD-EPI Creatinine Equation (2021)    Anion gap 03/19/2021 9  5 - 15 Final   Performed at [facility], [place] [address], [place], [name] [address]   WBC 03/19/2021 9.3  4.0 - 10.5 K/uL Final   RBC 03/19/2021 4.79  4.22 - 5.81 MIL/uL Final   Hemoglobin 03/19/2021 14.6  13.0 - 17.0 g/dL Final   HCT 03/19/2021 43.6  39.0 - 52.0 % Final   MCV 03/19/2021 91.0  80.0 - 100.0 fL Final   MCH 03/19/2021 30.5  26.0 - 34.0 pg Final   MCHC 03/19/2021 33.5  30.0 - 36.0 g/dL Final   RDW 03/19/2021 13.2  11.5 - 15.5 % Final   Platelets 03/19/2021 174  150 - 400 K/uL Final   nRBC 03/19/2021 0.0  0.0 - 0.2 % Final   Neutrophils Relative % 03/19/2021 82  % Final   Neutro Abs 03/19/2021 7.6  1.7 - 7.7 K/uL Final   Lymphocytes Relative 03/19/2021 11  % Final   Lymphs Abs 03/19/2021 1.0  0.7 - 4.0 K/uL Final    Monocytes Relative 03/19/2021 6  % Final   Monocytes Absolute 03/19/2021 0.5  0.1 - 1.0 K/uL Final   Eosinophils Relative 03/19/2021 1  % Final   Eosinophils Absolute 03/19/2021 0.1  0.0 - 0.5 K/uL Final   Basophils Relative 03/19/2021 0  % Final   Basophils Absolute 03/19/2021 0.0  0.0 - 0.1 K/uL Final   Immature Granulocytes 03/19/2021 0  % Final   Abs Immature Granulocytes 03/19/2021 0.03  0.00 - 0.07 K/uL Final   Performed at [facility], [place] [address], [place], [place] [address]   Color, Urine 03/19/2021 YELLOW  YELLOW Final   APPearance 03/19/2021 CLEAR  CLEAR Final   Specific Gravity, Urine 03/19/2021 1.016  1.005 - 1.030 Final   pH 03/19/2021 5.0  5.0 - 8.0 Final   Glucose, UA 03/19/2021 NEGATIVE  NEGATIVE mg/dL Final   Hgb urine dipstick 03/19/2021 NEGATIVE  NEGATIVE Final   Bilirubin Urine 03/19/2021 NEGATIVE  NEGATIVE Final   Ketones, ur 03/19/2021 NEGATIVE  NEGATIVE mg/dL Final   Protein, ur 03/19/2021 NEGATIVE  NEGATIVE mg/dL Final   Nitrite 03/19/2021 NEGATIVE  NEGATIVE Final   Leukocytes,Ua 03/19/2021 NEGATIVE  NEGATIVE Final   Performed at [facility], [place] [address], [place], [name] [address]   Lipase 03/19/2021 41  11 - 51 U/L Final   Performed at [facility], [place] [address] [place], [place] [address]  Admission on 03/13/2021, Discharged on 03/15/2021  Component Date Value Ref Range Status   SARS Coronavirus 2 by RT PCR 03/13/2021 NEGATIVE  NEGATIVE Final   Comment: (NOTE) SARS-CoV-2 target nucleic acids are NOT DETECTED.  The SARS-CoV-2 RNA is generally detectable in upper respiratory specimens during the acute phase of infection. The lowest concentration of SARS-CoV-2 viral copies this assay can detect is 138 copies/mL. A negative result does not preclude SARS-Cov-2 infection and should not be used as the sole basis  for treatment or other patient management decisions. A negative result may occur with   improper specimen collection/handling, submission of specimen other than nasopharyngeal swab, presence of viral mutation(s) within the areas targeted by this assay, and inadequate number of viral copies(<138 copies/mL). A negative result must be combined with clinical observations, patient history, and epidemiological information. The expected result is Negative.  Fact Sheet for Patients:  BloggerCourse.com  Fact Sheet for Healthcare Providers:  SeriousBroker.it  This test is no                          t yet approved or cleared by the [place] FDA and  has been authorized for detection and/or diagnosis of SARS-CoV-2 by FDA under an Emergency Use Authorization (EUA). This EUA will remain  in effect (meaning this test can be used) for the duration of the COVID-19 declaration under Section 564(b)(1) of the Act, 21 U.S.C.section 360bbb-3(b)(1), unless the authorization is terminated  or revoked sooner.       Influenza A by PCR 03/13/2021 NEGATIVE  NEGATIVE Final   Influenza B by PCR 03/13/2021 NEGATIVE  NEGATIVE Final   Comment: (NOTE) The Xpert Xpress SARS-CoV-2/FLU/RSV plus assay is intended as an aid in the diagnosis of influenza from Nasopharyngeal swab specimens and should not be used as a sole basis for treatment. Nasal washings and aspirates are unacceptable for Xpert Xpress SARS-CoV-2/FLU/RSV testing.  Fact Sheet for Patients: BloggerCourse.com  Fact Sheet for Healthcare Providers: SeriousBroker.it  This test is not yet approved or cleared by the [place] FDA and has been authorized for detection and/or diagnosis of SARS-CoV-2 by FDA under an Emergency Use Authorization (EUA). This EUA will remain in effect (meaning this test can be used) for the duration of the COVID-19 declaration under Section 564(b)(1) of the Act, 21 U.S.C. section 360bbb-3(b)(1), unless  the authorization is terminated or revoked.  Performed at [facility], [address], [place], [place] [address]    WBC 03/13/2021 12.7 (H)  4.0 - 10.5 K/uL Final   RBC 03/13/2021 5.19  4.22 - 5.81 MIL/uL Final   Hemoglobin 03/13/2021 15.7  13.0 - 17.0 g/dL Final   HCT 52/38/7583 46.9  39.0 - 52.0 % Final   MCV 03/13/2021 90.4  80.0 - 100.0 fL Final   MCH 03/13/2021 30.3  26.0 - 34.0 pg Final   MCHC 03/13/2021 33.5  30.0 - 36.0 g/dL Final   RDW 23/19/2477 13.6  11.5 - 15.5 % Final   Platelets 03/13/2021 228  150 - 400 K/uL Final   nRBC 03/13/2021 0.0  0.0 - 0.2 % Final   Neutrophils Relative % 03/13/2021 72  % Final   Neutro Abs 03/13/2021 9.1 (H)  1.7 - 7.7 K/uL Final   Lymphocytes Relative 03/13/2021 20  % Final   Lymphs Abs 03/13/2021 2.5  0.7 - 4.0 K/uL Final   Monocytes Relative 03/13/2021 8  % Final   Monocytes Absolute 03/13/2021 1.0  0.1 - 1.0 K/uL Final   Eosinophils Relative 03/13/2021 0  % Final   Eosinophils Absolute 03/13/2021 0.0  0.0 - 0.5 K/uL Final   Basophils Relative 03/13/2021 0  % Final   Basophils Absolute 03/13/2021 0.0  0.0 - 0.1 K/uL Final   Immature Granulocytes 03/13/2021 0  % Final   Abs Immature Granulocytes 03/13/2021 0.05  0.00 - 0.07 K/uL Final   Performed at [facility], [address], [place], [place] [address]  Sodium 03/13/2021 135  135 - 145 mmol/L Final   Potassium 03/13/2021 4.3  3.5 - 5.1 mmol/L Final   Chloride 03/13/2021 99  98 - 111 mmol/L Final   CO2 03/13/2021 25  22 - 32 mmol/L Final   Glucose, Bld 03/13/2021 77  70 - 99 mg/dL Final   Glucose reference range applies only to samples taken after fasting for at least 8 hours.   BUN 03/13/2021 28 (H)  6 - 20 mg/dL Final   Creatinine, Ser 03/13/2021 1.07  0.61 - 1.24 mg/dL Final   Calcium 48/39/1262 10.1  8.9 - 10.3 mg/dL Final   Total Protein 13/20/0050 8.4 (H)  6.5 - 8.1 g/dL Final   Albumin 58/02/1549 5.0  3.5 - 5.0 g/dL Final   AST 73/01/9605 409 (H)  15 - 41 U/L  Final   ALT 03/13/2021 101 (H)  0 - 44 U/L Final   Alkaline Phosphatase 03/13/2021 103  38 - 126 U/L Final   Total Bilirubin 03/13/2021 0.7  0.3 - 1.2 mg/dL Final   GFR, Estimated 03/13/2021 >60  >60 mL/min Final   Comment: (NOTE) Calculated using the CKD-EPI Creatinine Equation (2021)    Anion gap 03/13/2021 11  5 - 15 Final   Performed at [facility], [address] [place], [place] [address]   Hgb A1c MFr Bld 03/13/2021 5.7 (H)  4.8 - 5.6 % Final   Comment: (NOTE) Pre diabetes:          5.7%-6.4%  Diabetes:              >6.4%  Glycemic control for   <7.0% adults with diabetes    Mean Plasma Glucose 03/13/2021 116.89  mg/dL Final   Performed at [facility], [address] [place], [place] [address]   Alcohol, Ethyl (B) 03/13/2021 <10  <10 mg/dL Final   Comment: (NOTE) Lowest detectable limit for serum alcohol is 10 mg/dL.  For medical purposes only. Performed at [facility], [address] [place], [place] [address]    TSH 03/13/2021 1.051  0.350 - 4.500 uIU/mL Final   Comment: Performed by a 3rd Generation assay with a functional sensitivity of <=0.01 uIU/mL. Performed at [facility], [address] [place], [place] [address]    POC Amphetamine UR 03/13/2021 None Detected  NONE DETECTED (Cut Off Level 1000 ng/mL) Final   POC Secobarbital (BAR) 03/13/2021 None Detected  NONE DETECTED (Cut Off Level 300 ng/mL) Final   POC Buprenorphine (BUP) 03/13/2021 None Detected  NONE DETECTED (Cut Off Level 10 ng/mL) Final   POC Oxazepam (BZO) 03/13/2021 None Detected  NONE DETECTED (Cut Off Level 300 ng/mL) Final   POC Cocaine UR 03/13/2021 Positive (A)  NONE DETECTED (Cut Off Level 300 ng/mL) Final   POC Methamphetamine UR 03/13/2021 None Detected  NONE DETECTED (Cut Off Level 1000 ng/mL) Final   POC Morphine 03/13/2021 None Detected  NONE DETECTED (Cut Off Level 300 ng/mL) Final   POC Oxycodone UR 03/13/2021 None Detected  NONE DETECTED (Cut Off Level  100 ng/mL) Final   POC Methadone UR 03/13/2021 None Detected  NONE DETECTED (Cut Off Level 300 ng/mL) Final   POC Marijuana UR 03/13/2021 None Detected  NONE DETECTED (Cut Off Level 50 ng/mL) Final   SARS Coronavirus 2 Ag 03/13/2021 Negative  Negative Final   SARSCOV2ONAVIRUS 2 AG 03/13/2021 NEGATIVE  NEGATIVE Final   Comment: (NOTE) SARS-CoV-2 antigen NOT DETECTED.   Negative results are presumptive.  Negative results do not preclude SARS-CoV-2 infection and should not be used as the sole basis for treatment or other patient management decisions, including infection  control decisions, particularly in the presence of clinical signs and  symptoms consistent with COVID-19, or in those who have been in contact with the virus.  Negative results must be combined with clinical observations, patient history, and epidemiological information. The expected result is Negative.  Fact Sheet for Patients: HandmadeRecipes.com.cy  Fact Sheet for Healthcare Providers: FuneralLife.at  This test is not yet approved or cleared by the [place] FDA and  has been authorized for detection and/or diagnosis of SARS-CoV-2 by FDA under an Emergency Use Authorization (EUA).  This EUA will remain in effect (meaning this test can be used) for the duration of  the COV                          ID-19 declaration under Section 564(b)(1) of the Act, 21 U.S.C. section 360bbb-3(b)(1), unless the authorization is terminated or revoked sooner.     Cholesterol 03/13/2021 196  0 - 200 mg/dL Final   Triglycerides 03/13/2021 62  <150 mg/dL Final   HDL 03/13/2021 56  >40 mg/dL Final   Total CHOL/HDL Ratio 03/13/2021 3.5  RATIO Final   VLDL 03/13/2021 12  0 - 40 mg/dL Final   LDL Cholesterol 03/13/2021 128 (H)  0 - 99 mg/dL Final   Comment:        Total Cholesterol/HDL:CHD Risk Coronary Heart Disease Risk Table                     Men   Women  1/2 Average Risk   3.4    3.3  Average Risk       5.0   4.4  2 X Average Risk   9.6   7.1  3 X Average Risk  23.4   11.0        Use the calculated Patient Ratio above and the CHD Risk Table to determine the patient's CHD Risk.        ATP III CLASSIFICATION (LDL):  <100     mg/dL   Optimal  100-129  mg/dL   Near or Above                    Optimal  130-159  mg/dL   Borderline  160-189  mg/dL   High  >190     mg/dL   Very High Performed at [place] [address], [place], [place] [address]    SARS Coronavirus 2 Ag 03/15/2021 Negative  Negative Final   SARSCOV2ONAVIRUS 2 AG 03/15/2021 NEGATIVE  NEGATIVE Final   Comment: (NOTE) SARS-CoV-2 antigen NOT DETECTED.   Negative results are presumptive.  Negative results do not preclude SARS-CoV-2 infection and should not be used as the sole basis for treatment or other patient management decisions, including infection  control decisions, particularly in the presence of clinical signs and  symptoms consistent with COVID-19, or in those who have been in contact with the virus.  Negative results must be combined with clinical observations, patient history, and epidemiological information. The expected result is Negative.  Fact Sheet for Patients: HandmadeRecipes.com.cy  Fact Sheet for Healthcare Providers: FuneralLife.at  This test is not yet approved or cleared by the [place] FDA and  has been authorized for detection and/or diagnosis of SARS-CoV-2 by FDA under an Emergency Use Authorization (EUA).  This EUA will remain in effect (meaning this test can be used) for the duration of  the COV                          ID-19 declaration under Section 564(b)(1) of the Act, 21 U.S.C. section 360bbb-3(b)(1), unless the authorization is terminated or revoked sooner.      Blood Alcohol level:  Lab Results  Component Value Date   ETH <10 07/02/2021   ETH <10 54/62/7035    Metabolic Disorder  Labs: Lab Results  Component Value Date   HGBA1C 5.7 (H) 03/13/2021   MPG 116.89 03/13/2021   MPG 125.5 10/26/2020   Lab Results  Component Value Date   PROLACTIN 12.1 03/08/2020   PROLACTIN 2.6 (L) 11/09/2019   Lab Results  Component Value Date   CHOL 244 (H) 08/28/2021   TRIG 65 08/28/2021   HDL 51 08/28/2021   CHOLHDL 4.8 08/28/2021   VLDL 13 08/28/2021   LDLCALC 180 (H) 08/28/2021   LDLCALC 75 03/17/2021    Therapeutic Lab Levels: No results found for: "LITHIUM" No results found for: "VALPROATE" No results found for: "CBMZ"  Physical Findings   AIMS    Flowsheet Row ED to Hosp-Admission (Discharged) from 03/15/2021 in [place] [facility] Admission (Discharged) from 04/28/2020 in [place] Admission (Discharged) from 03/07/2020 in [place] [facility]B  AIMS Total Score 0 0 0      AUDIT    Flowsheet Row ED to Hosp-Admission (Discharged) from 03/15/2021 in [place] [facility] Admission (Discharged) from 04/28/2020 in [place] Admission (Discharged) from 03/07/2020 in [place] [facility] Admission (Discharged) from 12/12/2011 in [place] [facility]  Alcohol Use Disorder Identification Test Final Score (AUDIT) $RemoveBefor'3 3 13 8      '[id number]$ GAD-7    Flowsheet Row Office Visit from 03/23/2021 in [place] Office Visit from 01/17/2021 in [place] [facility] Office Visit from 03/17/2020 in [place] Office Visit from 07/22/2019 in [place] Office Visit from 09/03/2018 in [place]  Total GAD-7 Score $RemoveBef'14 9 16 19 19      'iGHMysjCjK$ PHQ2-9    [place] ED from 07/02/2021 in [facility] Office Visit from 03/23/2021 in [place] Office Visit from  01/20/2021 in [facility] for [facility]ious Disease Office Visit from 01/17/2021 in [place] [facility] ED from 10/25/2020 in [facility]  PHQ-2 Total Score 2 2 0 4 5  PHQ-9 Total Score 7 10 -- 18 [place] ED from 08/28/2021 in [facility] Most recent reading at 08/28/2021 10:15 AM ED to Hosp-Admission (Discharged) from 07/02/2021 in [place] Most recent reading at 07/03/2021 12:00 PM ED from 07/02/2021 in [facility] Most recent reading at 07/02/2021 10:10 AM  C-SSRS RISK CATEGORY High Risk High Risk Low Risk        Musculoskeletal  Strength & Muscle Tone: within normal limits Gait & Station: normal Patient leans: N/A  Psychiatric Specialty Exam  Presentation  General Appearance: Casual  Eye Contact:Fair  Speech:Clear and Coherent  Speech Volume:Normal  Handedness:Right   Mood and Affect  Mood:Depressed  Affect:Congruent  Thought Process  Thought Processes:Coherent; Linear  Descriptions of Associations:Intact  Orientation:Full (Time, Place and Person)  Thought Content:Logical  Diagnosis of Schizophrenia or Schizoaffective disorder in past: Yes  Duration of Psychotic Symptoms: Greater than six months   Hallucinations: AH of indistinct voices Ideas of Reference:None  Suicidal Thoughts: yes, refuses to discuss whether or not he has a plan Homicidal Thoughts:Homicidal Thoughts: No   Sensorium  Memory:Immediate Good; Recent Good; Remote Good  Judgment:Poor  Insight:Fair   Executive Functions  Concentration:Fair  Attention Span:Fair  [employer]   Psychomotor Activity  Psychomotor Activity:Psychomotor Activity: Normal   Assets  Assets:Communication Skills; Desire for Improvement; Financial Resources/Insurance; Housing   Sleep  Sleep: good  Nutritional Assessment (For  OBS and FBC admissions only) Has the patient had a weight loss or gain of 10 pounds or more in the last 3 months?: No Has the patient had a decrease in food intake/or appetite?: No Does the patient have dental problems?: No Does the patient have eating habits or behaviors that may be indicators of an eating disorder including binging or inducing vomiting?: No Has the patient recently lost weight without trying?: 0 Has the patient been eating poorly because of a decreased appetite?: 0 Malnutrition Screening Tool Score: 0    Physical Exam  Physical Exam Constitutional:      Appearance: the patient is not toxic-appearing.  Pulmonary:     Effort: Pulmonary effort is normal.  Neurological:     General: No focal deficit present.     Mental Status: the patient is alert and oriented to person, place, and time.   Review of Systems  Respiratory:  Negative for shortness of breath.   Cardiovascular:  Negative for chest pain.  Gastrointestinal:  Negative for abdominal pain, constipation, diarrhea, nausea and vomiting.  Neurological:  Negative for headaches.   Blood pressure 110/64, pulse 95, temperature 98.4 F (36.9 C), temperature source Oral, resp. rate 17, SpO2 100 %. There is no height or weight on file to calculate BMI.  Treatment Plan Summary: Daily contact with patient to assess and evaluate symptoms and progress in treatment and Medication management  Status: voluntary admission to the observation unit Dispo: recommend inpatient placement, patient has been faxed out per LCSW  SAFD-Bipolar type -Discontinue Seroquel 200 mg nightly -Start Invega 6 mg QHS tonight, with plan for Invega sustenna -Continue Trazodone 100 mg QHS  Labs: elevated LDL of 180 (nonfasting?)    Corky Sox, MD 08/29/2021 4:21 PM

## 2021-08-29 NOTE — ED Notes (Signed)
Per admitting RN, patient refused EKG on admission - sleeping at this time - will pass to day shift that when he wakes up to get urine and EKG

## 2021-08-29 NOTE — ED Notes (Signed)
Received patient this PM. Patient is watching TV. No acute distress noted Patient respirations are even and unlabored. Will continue to monitor for safety.

## 2021-08-29 NOTE — ED Notes (Signed)
Pt refuse vitals  

## 2021-08-29 NOTE — ED Notes (Signed)
Pt sleeping at present, no distress noted.  Monitoring for safety. 

## 2021-08-29 NOTE — ED Notes (Signed)
Pt is in the bed sleeping. Respirations are even and unlabored. No acute distress noted. Will continue to monitor for safety. 

## 2021-08-30 ENCOUNTER — Encounter (HOSPITAL_COMMUNITY): Payer: Self-pay | Admitting: Student

## 2021-08-30 ENCOUNTER — Other Ambulatory Visit: Payer: Self-pay

## 2021-08-30 ENCOUNTER — Inpatient Hospital Stay (HOSPITAL_COMMUNITY)
Admission: AD | Admit: 2021-08-30 | Discharge: 2021-09-02 | DRG: 885 | Disposition: A | Discharge status: Home / Self Care | Payer: Medicaid Other | Source: Intra-hospital | Attending: Psychiatry | Admitting: Psychiatry

## 2021-08-30 DIAGNOSIS — M797 Fibromyalgia: Secondary | ICD-10-CM | POA: Diagnosis present

## 2021-08-30 DIAGNOSIS — Z818 Family history of other mental and behavioral disorders: Secondary | ICD-10-CM | POA: Diagnosis not present

## 2021-08-30 DIAGNOSIS — B20 Human immunodeficiency virus [HIV] disease: Secondary | ICD-10-CM | POA: Diagnosis present

## 2021-08-30 DIAGNOSIS — F1410 Cocaine abuse, uncomplicated: Secondary | ICD-10-CM | POA: Diagnosis present

## 2021-08-30 DIAGNOSIS — F419 Anxiety disorder, unspecified: Secondary | ICD-10-CM | POA: Diagnosis present

## 2021-08-30 DIAGNOSIS — I10 Essential (primary) hypertension: Secondary | ICD-10-CM | POA: Diagnosis present

## 2021-08-30 DIAGNOSIS — K30 Functional dyspepsia: Secondary | ICD-10-CM | POA: Diagnosis present

## 2021-08-30 DIAGNOSIS — R45851 Suicidal ideations: Secondary | ICD-10-CM | POA: Diagnosis present

## 2021-08-30 DIAGNOSIS — F250 Schizoaffective disorder, bipolar type: Principal | ICD-10-CM | POA: Diagnosis present

## 2021-08-30 DIAGNOSIS — Z79899 Other long term (current) drug therapy: Secondary | ICD-10-CM

## 2021-08-30 DIAGNOSIS — F17210 Nicotine dependence, cigarettes, uncomplicated: Secondary | ICD-10-CM | POA: Diagnosis present

## 2021-08-30 DIAGNOSIS — G4700 Insomnia, unspecified: Secondary | ICD-10-CM | POA: Diagnosis present

## 2021-08-30 DIAGNOSIS — F411 Generalized anxiety disorder: Secondary | ICD-10-CM

## 2021-08-30 MED ORDER — ROSUVASTATIN CALCIUM 10 MG PO TABS
10.0000 mg | ORAL_TABLET | Freq: Every day | ORAL | Status: DC
  Administered 2021-08-31 – 2021-09-02 (×3): 10 mg via ORAL
  Filled 2021-08-30 (×6): qty 1

## 2021-08-30 MED ORDER — ACETAMINOPHEN 325 MG PO TABS
650.0000 mg | ORAL_TABLET | Freq: Four times a day (QID) | ORAL | Status: DC | PRN
  Administered 2021-08-30 – 2021-08-31 (×2): 650 mg via ORAL
  Filled 2021-08-30 (×2): qty 2

## 2021-08-30 MED ORDER — BICTEGRAVIR-EMTRICITAB-TENOFOV 50-200-25 MG PO TABS
1.0000 | ORAL_TABLET | Freq: Every day | ORAL | Status: DC
  Administered 2021-08-31 – 2021-09-02 (×3): 1 via ORAL
  Filled 2021-08-30 (×7): qty 1

## 2021-08-30 MED ORDER — GABAPENTIN 300 MG PO CAPS
300.0000 mg | ORAL_CAPSULE | Freq: Three times a day (TID) | ORAL | Status: DC
  Administered 2021-08-30 – 2021-09-02 (×8): 300 mg via ORAL
  Filled 2021-08-30 (×16): qty 1

## 2021-08-30 MED ORDER — PALIPERIDONE ER 6 MG PO TB24
6.0000 mg | ORAL_TABLET | Freq: Every day | ORAL | Status: DC
  Administered 2021-08-30: 6 mg via ORAL
  Filled 2021-08-30 (×3): qty 1

## 2021-08-30 MED ORDER — TRAZODONE HCL 100 MG PO TABS
100.0000 mg | ORAL_TABLET | Freq: Every day | ORAL | Status: DC
  Administered 2021-08-30 – 2021-08-31 (×2): 100 mg via ORAL
  Filled 2021-08-30 (×5): qty 1

## 2021-08-30 MED ORDER — ALUM & MAG HYDROXIDE-SIMETH 200-200-20 MG/5ML PO SUSP
30.0000 mL | ORAL | Status: DC | PRN
  Administered 2021-08-30: 30 mL via ORAL
  Filled 2021-08-30: qty 30

## 2021-08-30 MED ORDER — PALIPERIDONE ER 6 MG PO TB24: 6.0000 mg | ORAL_TABLET | Freq: Every day | ORAL | Status: DC

## 2021-08-30 MED ORDER — MUPIROCIN 2 % EX OINT
1.0000 | TOPICAL_OINTMENT | Freq: Every day | CUTANEOUS | Status: DC
  Administered 2021-09-01 – 2021-09-02 (×2): 1 via TOPICAL
  Filled 2021-08-30: qty 22

## 2021-08-30 MED ORDER — NICOTINE 21 MG/24HR TD PT24
21.0000 mg | MEDICATED_PATCH | Freq: Every day | TRANSDERMAL | Status: DC
  Filled 2021-08-30: qty 1

## 2021-08-30 MED ORDER — HYDROXYZINE HCL 25 MG PO TABS: 25.0000 mg | ORAL_TABLET | Freq: Three times a day (TID) | ORAL | Status: DC | PRN

## 2021-08-30 MED ORDER — MAGNESIUM HYDROXIDE 400 MG/5ML PO SUSP: 30.0000 mL | Freq: Every day | ORAL | Status: DC | PRN

## 2021-08-30 NOTE — Discharge Instructions (Addendum)
Transfer to behavioral health hospital

## 2021-08-30 NOTE — ED Notes (Signed)
Breakfast given.  

## 2021-08-30 NOTE — Progress Notes (Signed)
The focus of this group is to help patients review their daily goal of treatment and discuss progress on daily workbooks.  Pt attended the evening group and responded to all discussion prompts from the [place]. Pt shared that today was a good day on the unit, the highlight of which was "being glad to be here."  Pt told that his goals this week included getting his medications straightened out, finding a new place to live, and getting back to school as soon as possible.  Pt rated his day a 4 out of 10 and his affect was appropriate.

## 2021-08-30 NOTE — Tx Team (Signed)
Initial Treatment Plan 08/30/2021 5:54 PM Dylan [name] RCB:[id number]    PATIENT STRESSORS: Financial difficulties   Health problems   Substance abuse     PATIENT STRENGTHS: Communication skills  Motivation for treatment/growth    PATIENT IDENTIFIED PROBLEMS: "Get my mind right"  "Find placement"  Substance Abuse  Suicidal thoughts               DISCHARGE CRITERIA:  Ability to meet basic life and health needs Motivation to continue treatment in a less acute level of care  PRELIMINARY DISCHARGE PLAN: Attend aftercare/continuing care group Outpatient therapy Placement in alternative living arrangements  PATIENT/FAMILY INVOLVEMENT: This treatment plan has been presented to and reviewed with the patient, Dylan [name], and/or family member.  The patient and family have been given the opportunity to ask questions and make suggestions.  Vela Prose, RN 08/30/2021, 5:54 PM

## 2021-08-30 NOTE — ED Notes (Signed)
Pt is in the bed sleeping. Respirations are even and unlabored. No acute distress noted. Will continue to monitor for safety. 

## 2021-08-30 NOTE — ED Provider Notes (Signed)
FBC/OBS ASAP Discharge Summary  Date and Time: 08/30/2021 6:03 PM  Name: Dylan [name]  MRN:  [id number]   Discharge Diagnoses:  Final diagnoses:  Schizoaffective disorder, bipolar type [facility])    Subjective:  Dylan [name] is a 56 year old male with a past psychiatric history of schizoaffective disorder bipolar type, with multiple previous admissions.  The patient presented to the [facility] behavioral health urgent care voluntarily via the [facility] on 8/01 with suicidal thoughts.  The patient reported that he could not sleep the night of his presentation (8/19). He states "I kept hearing voices that kept me awake".  (He reports noncompliance with his Seroquel 200 mg QHS). Because of this, he went walking, but he states he did not bring any shoes.  He states that he was walking barefoot on the street for some time before the [facility] came.  He states that they had a friendly interaction and that they asked that he come to the [facility] behavioral health urgent care.  The patient reports living in an [place].  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  He reports that he has seen Dr. Kai Levins at the [facility] He states that he has been medication noncompliant since July.   The patient's nightly Seroquel was discontinued in favor of starting Invega p.o.,  With the intention of transitioning to [place].  On assessment this morning, the patient reports that his sleep was "okay".  He reports worrying about several different things.  He states that he yelled expletives at a member of his [place] yesterday evening.  He states that he is unsure if he has a place to go.  When asked about suicidal thoughts he states "no comment".  He denies experiencing homicidal thoughts.  He reports hearing auditory  hallucinations last night.  Stay Summary: No major behavioral issues were noted.  Total Time spent with patient: 15 minutes  Past Psychiatric History: as above Past Medical History:  Past Medical History:  Diagnosis Date   Anxiety    Arthritis    Bipolar 1 disorder ([place])    Colon polyps    Depression    GERD (gastroesophageal reflux disease)    Hepatitis B    Human immunodeficiency virus (HIV) ([place])    Hyperlipidemia    Hypertension    Neuromuscular disorder ([place])    neuropathy   Neuropathy    Pre-diabetes    Schizophrenia ([place])     Past Surgical History:  Procedure Laterality Date   FOOT ARTHRODESIS Right 09/17/2019   Procedure: FUSION RIGHT LISFRANC JOINT;  Surgeon: Newt Minion, MD;  Location: [place];  Service: Orthopedics;  Laterality: Right;   FOOT ARTHRODESIS Right 09/2019   HEMORROIDECTOMY     IR FLUORO GUIDE CV LINE RIGHT  02/22/2017   IR US GUIDE VASC ACCESS RIGHT  02/22/2017   TUMOR REMOVAL     From Chest   Family History:  Family History  Problem Relation Age of Onset   CAD Mother    Diabetes Father    Colon cancer Maternal Aunt 60   Diabetes Maternal Aunt    Heart disease Maternal Uncle    Esophageal cancer Neg Hx    Rectal cancer Neg Hx    Stomach cancer Neg Hx    Family Psychiatric History: as per H and P  Social History:  Social History   Substance and Sexual Activity  Alcohol Use Yes   Comment: occassional drinker     Social History   Substance and Sexual Activity  Drug Use Yes   Types: Cocaine   Comment: last use was Friday 03/05/2020    Social History   Socioeconomic History   Marital status: Married    Spouse name: Not on file   Number of children: 1   Years of education: Not on file   Highest education level: Not on file  Occupational History   Not on file  Tobacco Use   Smoking status: Every Day    Packs/day: 0.50    Years: 25.00    Total pack years: 12.50    Types: E-cigarettes, Cigarettes   Smokeless tobacco: Never    Tobacco comments:    has patches when he is ready to quit  Vaping Use   Vaping Use: Every day   Start date: 12/09/2020   Substances: Nicotine  Substance and Sexual Activity   Alcohol use: Yes    Comment: occassional drinker   Drug use: Yes    Types: Cocaine    Comment: last use was Friday 03/05/2020   Sexual activity: Yes    Partners: Female    Comment: condoms given  Other Topics Concern   Not on file  Social History Narrative   Not on file   Social Determinants of Health   Financial Resource Strain: Not on file  Food Insecurity: Not on file  Transportation Needs: Not on file  Physical Activity: Not on file  Stress: Not on file  Social Connections: Not on file   SDOH:  SDOH Screenings   Alcohol Screen: Low Risk  (03/15/2021)   Alcohol Screen    Last Alcohol Screening Score (AUDIT): 3  Depression (PHQ2-9): Medium Risk (07/02/2021)   Depression (PHQ2-9)    PHQ-2 Score: 7  Financial Resource Strain: Not on file  Food Insecurity: Not on file  Housing: Not on file  Physical Activity: Not on file  Social Connections: Not on file  Stress: Not on file  Tobacco Use: High Risk (08/30/2021)   Patient History    Smoking Tobacco Use: Every Day    Smokeless Tobacco Use: Never    Passive Exposure: Not on file  Transportation Needs: Not on file    Tobacco Cessation:  Prescription not provided because: transfer  Current Medications:  No current facility-administered medications for this encounter.   No current outpatient medications on file.   Facility-Administered Medications Ordered in Other Encounters  Medication Dose Route Frequency Provider Last Rate Last Admin   acetaminophen (TYLENOL) tablet 650 mg  650 mg Oral Q6H PRN Corky Sox, MD       alum & mag hydroxide-simeth (MAALOX/MYLANTA) 200-200-20 MG/5ML suspension 30 mL  30 mL Oral Q4H PRN Corky Sox, MD       bictegravir-emtricitabine-tenofovir AF (BIKTARVY) 50-200-25 MG per tablet 1 tablet  1 tablet Oral  Daily Corky Sox, MD       gabapentin (NEURONTIN) capsule 300 mg  300 mg Oral TID Corky Sox, MD       hydrOXYzine (ATARAX) tablet 25 mg  25 mg Oral TID PRN Corky Sox, MD       magnesium hydroxide (MILK OF MAGNESIA) suspension 30 mL  30 mL Oral Daily PRN Corky Sox, MD       mupirocin ointment (BACTROBAN) 2 % 1 Application  1 Application Topical Daily Corky Sox, MD       [  START ON 08/31/2021] nicotine (NICODERM CQ - dosed in mg/24 hours) patch 21 mg  21 mg Transdermal Q0600 Corky Sox, MD       paliperidone (INVEGA) 24 hr tablet 6 mg  6 mg Oral QHS Corky Sox, MD       rosuvastatin (CRESTOR) tablet 10 mg  10 mg Oral Daily Corky Sox, MD       traZODone (DESYREL) tablet 100 mg  100 mg Oral QHS Corky Sox, MD        PTA Medications: (Not in a hospital admission)      07/02/2021   10:10 AM 03/23/2021    1:57 PM 01/20/2021    3:49 PM  Depression screen PHQ 2/9  Decreased Interest 1 1 0  Down, Depressed, Hopeless 1 1 0  PHQ - 2 Score 2 2 0  Altered sleeping 1 3   Tired, decreased energy 1 2   Change in appetite 1 0   Feeling bad or failure about yourself  1 1   Trouble concentrating 0 2   Moving slowly or fidgety/restless 0 0   Suicidal thoughts 1 0   PHQ-9 Score 7 10   Difficult doing work/chores Somewhat difficult      Flowsheet Row Admission (Current) from 08/30/2021 in [place] 400B ED from 08/28/2021 in [facility] ED to Hosp-Admission (Discharged) from 07/02/2021 in [place] No Risk High Risk High Risk       Musculoskeletal  Strength & Muscle Tone: within normal limits Gait & Station: normal Patient leans: N/A  Psychiatric Specialty Exam  Presentation  General Appearance: Casual  Eye Contact:Fair  Speech:Clear and Coherent  Speech Volume:Normal  Handedness:Right   Mood and Affect   Mood:Depressed  Affect:Congruent   Thought Process  Thought Processes:Coherent; Linear  Descriptions of Associations:Intact  Orientation:Full (Time, Place and Person)  Thought Content:Logical  Diagnosis of Schizophrenia or Schizoaffective disorder in past: Yes  Duration of Psychotic Symptoms: Greater than six months   Hallucinations:Hallucinations: Auditory Description of Auditory Hallucinations: indistinct voices  Ideas of Reference:None  Suicidal Thoughts: patient refuses to answer Homicidal Thoughts:Homicidal Thoughts: No   Sensorium  Memory:Immediate Good; Recent Good; Remote Good  Judgment:Poor  Insight:Fair   Executive Functions  Concentration:Fair  Attention Span:Fair  [place]   Psychomotor Activity  Psychomotor Activity:Psychomotor Activity: Normal   Assets  Assets:Communication Skills; Desire for Improvement; Financial Resources/Insurance; Housing   Sleep  Sleep:Sleep: Fair   Nutritional Assessment (For OBS and FBC admissions only) Has the patient recently lost weight without trying?: 0 Has the patient been eating poorly because of a decreased appetite?: 0 Malnutrition Screening Tool Score: 0    Physical Exam  Physical Exam Constitutional:      Appearance: the patient is not toxic-appearing.  Pulmonary:     Effort: Pulmonary effort is normal.  Neurological:     General: No focal deficit present.     Mental Status: the patient is alert and oriented to person, place, and time.   Review of Systems  Respiratory:  Negative for shortness of breath.   Cardiovascular:  Negative for chest pain.  Gastrointestinal:  Negative for abdominal pain, constipation, diarrhea, nausea and vomiting.  Neurological:  Negative for headaches.   Blood pressure 114/74, pulse 64, temperature 98.2 F (36.8 C), resp. rate 18, SpO2 100 %. There is no height or weight on file to calculate BMI.  Demographic  Factors:  Male  Loss Factors: NA  Historical Factors: Prior suicide attempts and Impulsivity  Risk Reduction Factors:   Positive coping skills or problem solving skills  Continued Clinical Symptoms:  Hallucinations, depression  Cognitive Features That Contribute To Risk:  None    Suicide Risk:  Moderate: Patient presented with identifiable suicidal ideation and presently refuses to answer questions about his suicidality.  Expect this risk assessment to improve with further medication management.   Plan Of Care/Follow-up recommendations:  Per [facility]  Disposition: Transfer to [facility]  Corky Sox, MD 08/30/2021, 6:03 PM

## 2021-08-30 NOTE — ED Notes (Signed)
Report given to [place] at [place] adult unit

## 2021-08-30 NOTE — Progress Notes (Signed)
Admission Note: Patient is a 56 year old male admitted voluntarily to the unit from [facility] for auditory hallucination, suicidal thoughts and substance abuse.  Reports hearing voices and has no place to stay.  Stated he is here to get his mind right and to find placement.  Patient presents with anxious affect and mood.  Admission plan of care reviewed, consent signed.  Skin and personal belongings completed.  Dark discoloration noted on right middle toe.  No contraband found.  Patient oriented to the unit, staff and room.  Routine safety checks initiated.  Verbalizes understanding of unit rules/protocols.  Patient is safe on the unit.

## 2021-08-31 ENCOUNTER — Encounter (HOSPITAL_COMMUNITY): Payer: Self-pay

## 2021-08-31 LAB — URINALYSIS, ROUTINE W REFLEX MICROSCOPIC
Bilirubin Urine: NEGATIVE
Glucose, UA: NEGATIVE mg/dL
Hgb urine dipstick: NEGATIVE
Ketones, ur: NEGATIVE mg/dL
Leukocytes,Ua: NEGATIVE
Nitrite: NEGATIVE
Protein, ur: NEGATIVE mg/dL
Specific Gravity, Urine: 1.009 (ref 1.005–1.030)
pH: 7 (ref 5.0–8.0)

## 2021-08-31 LAB — RAPID URINE DRUG SCREEN, HOSP PERFORMED
Amphetamines: NOT DETECTED
Barbiturates: NOT DETECTED
Benzodiazepines: NOT DETECTED
Cocaine: NOT DETECTED
Opiates: NOT DETECTED
Tetrahydrocannabinol: NOT DETECTED

## 2021-08-31 LAB — TSH: TSH: 2.828 u[IU]/mL (ref 0.350–4.500)

## 2021-08-31 MED ORDER — QUETIAPINE FUMARATE 50 MG PO TABS: 50.0000 mg | ORAL_TABLET | Freq: Every day | ORAL | Status: DC

## 2021-08-31 MED ORDER — QUETIAPINE FUMARATE 100 MG PO TABS
100.0000 mg | ORAL_TABLET | Freq: Every day | ORAL | Status: DC
Start: 2021-08-31 — End: 2021-09-01
  Administered 2021-08-31: 100 mg via ORAL
  Filled 2021-08-31 (×3): qty 1

## 2021-08-31 MED ORDER — HYDROXYZINE HCL 25 MG PO TABS
25.0000 mg | ORAL_TABLET | Freq: Three times a day (TID) | ORAL | Status: DC | PRN
  Administered 2021-08-31 (×2): 25 mg via ORAL
  Filled 2021-08-31 (×2): qty 1

## 2021-08-31 MED ORDER — NICOTINE POLACRILEX 2 MG MT GUM
2.0000 mg | CHEWING_GUM | OROMUCOSAL | Status: DC | PRN
  Administered 2021-08-31: 2 mg via ORAL
  Filled 2021-08-31: qty 1

## 2021-08-31 NOTE — BH IP Treatment Plan (Signed)
Interdisciplinary Treatment and Diagnostic Plan Update  08/31/2021 Time of Session: 9:15am  Dylan [name] MRN: [id number]  Principal Diagnosis: Schizoaffective disorder, bipolar type (HCC)  Secondary Diagnoses: Principal Problem:   Schizoaffective disorder, bipolar type (HCC)   Current Medications:  Current Facility-Administered Medications  Medication Dose Route Frequency Provider Last Rate Last Admin   acetaminophen (TYLENOL) tablet 650 mg  650 mg Oral Q6H PRN Gabrielle, Nick, MD   650 mg at 08/31/21 1135   alum & mag hydroxide-simeth (MAALOX/MYLANTA) 200-200-20 MG/5ML suspension 30 mL  30 mL Oral Q4H PRN Gabrielle, Nick, MD   30 mL at 08/30/21 2332   bictegravir-emtricitabine-tenofovir AF (BIKTARVY) 50-200-25 MG per tablet 1 tablet  1 tablet Oral Daily Gabrielle, Nick, MD   1 tablet at 08/31/21 0816   gabapentin (NEURONTIN) capsule 300 mg  300 mg Oral TID Gabrielle, Nick, MD   300 mg at 08/31/21 1134   hydrOXYzine (ATARAX) tablet 25 mg  25 mg Oral TID PRN Gabrielle, Nick, MD   25 mg at 08/31/21 0818   magnesium hydroxide (MILK OF MAGNESIA) suspension 30 mL  30 mL Oral Daily PRN Gabrielle, Nick, MD       mupirocin ointment (BACTROBAN) 2 % 1 Application  1 Application Topical Daily Gabrielle, Nick, MD       nicotine polacrilex (NICORETTE) gum 2 mg  2 mg Oral PRN Attiah, Nadir, MD   2 mg at 08/31/21 0818   QUEtiapine (SEROQUEL) tablet 100 mg  100 mg Oral QHS Nwoko, Agnes I, NP       rosuvastatin (CRESTOR) tablet 10 mg  10 mg Oral Daily Gabrielle, Nick, MD   10 mg at 08/31/21 0817   traZODone (DESYREL) tablet 100 mg  100 mg Oral QHS Gabrielle, Nick, MD   100 mg at 08/30/21 2105   PTA Medications: Medications Prior to Admission  Medication Sig Dispense Refill Last Dose   bictegravir-emtricitabine-tenofovir AF (BIKTARVY) 50-200-25 MG TABS tablet Take 1 tablet by mouth daily. 30 tablet 5    gabapentin (NEURONTIN) 300 MG capsule Take 1 capsule (300 mg total) by mouth 3 (three) times daily.  90 capsule 0    hydrOXYzine (ATARAX) 25 MG tablet Take 1 tablet (25 mg total) by mouth 3 (three) times daily as needed for anxiety. 30 tablet 0    mupirocin ointment (BACTROBAN) 2 % Apply 1 Application topically daily. 22 g 0    paliperidone (INVEGA) 6 MG 24 hr tablet Take 1 tablet (6 mg total) by mouth at bedtime.      rosuvastatin (CRESTOR) 10 MG tablet Take 1 tablet (10 mg total) by mouth daily. 90 tablet 1    traZODone (DESYREL) 100 MG tablet Take 1 tablet (100 mg total) by mouth at bedtime. 30 tablet 0     Patient Stressors: Financial difficulties   Health problems   Substance abuse    Patient Strengths: Communication skills  Motivation for treatment/growth   Treatment Modalities: Medication Management, Group therapy, Case management,  1 to 1 session with clinician, Psychoeducation, Recreational therapy.   Physician Treatment Plan for Primary Diagnosis: Schizoaffective disorder, bipolar type (HCC) Long Term Goal(s): Improvement in symptoms so as ready for discharge   Short Term Goals: Ability to identify and develop effective coping behaviors will improve Ability to maintain clinical measurements within normal limits will improve Compliance with prescribed medications will improve Ability to identify triggers associated with substance abuse/mental health issues will improve Ability to identify changes in lifestyle to reduce recurrence of condition will improve Ability to   verbalize feelings will improve Ability to disclose and discuss suicidal ideas Ability to demonstrate self-control will improve  Medication Management: Evaluate patient's response, side effects, and tolerance of medication regimen.  Therapeutic Interventions: 1 to 1 sessions, Unit Group sessions and Medication administration.  Evaluation of Outcomes: Not Met  Physician Treatment Plan for Secondary Diagnosis: Principal Problem:   Schizoaffective disorder, bipolar type ([place])  Long Term Goal(s): Improvement  in symptoms so as ready for discharge   Short Term Goals: Ability to identify and develop effective coping behaviors will improve Ability to maintain clinical measurements within normal limits will improve Compliance with prescribed medications will improve Ability to identify triggers associated with substance abuse/mental health issues will improve Ability to identify changes in lifestyle to reduce recurrence of condition will improve Ability to verbalize feelings will improve Ability to disclose and discuss suicidal ideas Ability to demonstrate self-control will improve     Medication Management: Evaluate patient's response, side effects, and tolerance of medication regimen.  Therapeutic Interventions: 1 to 1 sessions, Unit Group sessions and Medication administration.  Evaluation of Outcomes: Not Met   RN Treatment Plan for Primary Diagnosis: Schizoaffective disorder, bipolar type ([place]) Long Term Goal(s): Knowledge of disease and therapeutic regimen to maintain health will improve  Short Term Goals: Ability to remain free from injury will improve, Ability to participate in decision making will improve, Ability to verbalize feelings will improve, Ability to disclose and discuss suicidal ideas, and Ability to identify and develop effective coping behaviors will improve  Medication Management: RN will administer medications as ordered by provider, will assess and evaluate patient's response and provide education to patient for prescribed medication. RN will report any adverse and/or side effects to prescribing provider.  Therapeutic Interventions: 1 on 1 counseling sessions, Psychoeducation, Medication administration, Evaluate responses to treatment, Monitor vital signs and CBGs as ordered, Perform/monitor CIWA, COWS, AIMS and Fall Risk screenings as ordered, Perform wound care treatments as ordered.  Evaluation of Outcomes: Not Met   LCSW Treatment Plan for Primary Diagnosis:  Schizoaffective disorder, bipolar type ([place]) Long Term Goal(s): Safe transition to appropriate next level of care at discharge, Engage patient in therapeutic group addressing interpersonal concerns.  Short Term Goals: Engage patient in aftercare planning with referrals and resources, Increase social support, Increase emotional regulation, Facilitate acceptance of mental health diagnosis and concerns, Identify triggers associated with mental health/substance abuse issues, and Increase skills for wellness and recovery  Therapeutic Interventions: Assess for all discharge needs, 1 to 1 time with Social worker, Explore available resources and support systems, Assess for adequacy in community support network, Educate family and significant other(s) on suicide prevention, Complete Psychosocial Assessment, Interpersonal group therapy.  Evaluation of Outcomes: Not Met   Progress in Treatment: Attending groups: No. Participating in groups: No. Taking medication as prescribed: Yes. Toleration medication: Yes. Family/Significant other contact made: No, will contact:  Patient declined consents  Patient understands diagnosis: No. Discussing patient identified problems/goals with staff: Yes. Medical problems stabilized or resolved: Yes. Denies suicidal/homicidal ideation: Yes. Issues/concerns per patient self-inventory: No.   New problem(s) identified: No, Describe:  None   New Short Term/Long Term Goal(s): medication stabilization, elimination of SI thoughts, development of comprehensive mental wellness plan.   Patient Goals: "Get back on the right medications and into a boarding house".   Discharge Plan or Barriers: Patient recently admitted. CSW will continue to follow and assess for appropriate referrals and possible discharge planning.   Reason for Continuation of Hospitalization: Anxiety Hallucinations Suicidal ideation  Estimated Length of Stay: 3 to 7 days   Last 3 [place] Suicide  Severity Risk Score: Flowsheet Row Admission (Current) from 08/30/2021 in [place] [facility] ED from 08/28/2021 in [facility] ED to Hosp-Admission (Discharged) from 07/02/2021 in [place] No Risk High Risk High Risk       Last PHQ 2/9 Scores:    07/02/2021   10:10 AM 03/23/2021    1:57 PM 01/20/2021    3:49 PM  Depression screen PHQ 2/9  Decreased Interest 1 1 0  Down, Depressed, Hopeless 1 1 0  PHQ - 2 Score 2 2 0  Altered sleeping 1 3   Tired, decreased energy 1 2   Change in appetite 1 0   Feeling bad or failure about yourself  1 1   Trouble concentrating 0 2   Moving slowly or fidgety/restless 0 0   Suicidal thoughts 1 0   PHQ-9 Score 7 10   Difficult doing work/chores Somewhat difficult      Scribe for Treatment Team: Carney Harder 08/31/2021 2:33 PM

## 2021-08-31 NOTE — BHH Suicide Risk Assessment (Signed)
[place] INPATIENT:  Family/Significant Other Suicide Prevention Education  Suicide Prevention Education:  Patient Refusal for Family/Significant Other Suicide Prevention Education: The patient Dylan [name] has refused to provide written consent for family/significant other to be provided Family/Significant Other Suicide Prevention Education during admission and/or prior to discharge.  Physician notified.  Frutoso Chase [name] 08/31/2021, 10:46 AM

## 2021-08-31 NOTE — Progress Notes (Signed)
Recreation Therapy Notes  INPATIENT RECREATION THERAPY ASSESSMENT  Patient Details Name: Dylan [name] MRN: [id number] DOB: [birth year] Today's Date: 08/31/2021       Information Obtained From: Patient  Able to Participate in Assessment/Interview: Yes  Patient Presentation: Alert, Resistant  Reason for Admission (Per Patient): Other (Comments) ("ask the last person")  Patient Stressors: Other (Comment) ("everything")  Coping Skills:   TV, Music, Exercise, Meditate, Prayer, Hot Bath/Shower  Leisure Interests (2+):  Individual - TV  Frequency of Recreation/Participation: Other (Comment) (Daily)  Awareness of Community Resources:  Yes  Community Resources:  Restaurants, [employer], Art therapist  Current Use:    If no, Barriers?:    Expressed Interest in [place] of Residence:  [place]  Patient Main Form of Transportation: Diplomatic Services operational officer  Patient Strengths:  "I don't know"  Patient Identified Areas of Improvement:  No  Patient Goal for Hospitalization:  refused to answer  Current SI (including self-harm):   (refused to answer)  Current HI:   (refused to answer)  Current AVH:  (refused to answer)  Staff Intervention Plan: Group Attendance, Collaborate with Interdisciplinary Treatment Team  Consent to Intern Participation: N/A   Victorino Sparrow, Vickki Muff, [place] 08/31/2021, 1:26 PM

## 2021-08-31 NOTE — BHH Counselor (Signed)
CSW provided the Pt with a packet that contains information including shelter and housing resources, free and reduced price food information, clothing resources, crisis center information, a [employer] card, and suicide prevention information.   

## 2021-08-31 NOTE — Progress Notes (Signed)
The focus of this group is to help patients review their daily goal of treatment and discuss progress on daily workbooks.  Pt attended the evening group and responded to all discussion prompts from the [place]. Pt shared that today was a good day on the unit, the highlight of which were conversations he had with dayshift MHTs. "They really got me."  Pt told that, upon discharge, he planned to stay well by stopping to think before he acts in hopes of making better decisions. "I'm gonna give it up to God before I do something stupid."  Pt rated his day a 10 out of 10 and his affect was appropriate.

## 2021-08-31 NOTE — Group Note (Signed)
LCSW Group Therapy Note   Group Date: 08/31/2021 Start Time: 1300 End Time: 1400  Type of Therapy and Topic:  Group Therapy:  Self-Care after Hospitalization  Participation Level:  Did Not Attend   Description of Group This process group involved patients discussing how they plan to take care of themselves in a better manner when they get home from the hospital.  The Patients participated in a 10-Point Check-In where they went through the steps in an activity that includes naming and finding things from all 5 senses. The Patients also incorporated deep breathing into this activity.  We discussed a variety of other means of self-care which had a large range from hygiene activities to eating to setting boundaries to participating in peer support groups.   Therapeutic Goals Patient will identify and describe self-care activities to deliberately plan to use upon hospital discharge Patient will participate in generating additional ideas about healthy self-care options when they return to the community Patients will be supportive of one another and receive support from others   Summary of Patient Progress:  Did not attend     Therapeutic Modalities Brief Solution-Focused Therapy Psychoeducation  Darleen Crocker, [place] 08/31/2021  1:51 PM

## 2021-08-31 NOTE — BHH Suicide Risk Assessment (Signed)
Suicide Risk Assessment  Admission Assessment    [facility] Admission Suicide Risk Assessment   Nursing information obtained from:  Patient  Demographic factors:  Male, Low socioeconomic status, Unemployed  Current Mental Status:  Self-harm thoughts  Loss Factors:  Financial problems / change in socioeconomic status, Decrease in vocational status, Decline in physical health  Historical Factors:  Impulsivity  Risk Reduction Factors:  NA  Total Time spent with patient: 1 hour  Principal Problem: Schizoaffective disorder, bipolar type ([place])  Diagnosis:  Principal Problem:   Schizoaffective disorder, bipolar type ([place])  Subjective Data: See H&P.  Continued Clinical Symptoms:    The "Alcohol Use Disorders Identification Test", Guidelines for Use in Primary Care, Second Edition.  [employer] [facility]). Score between 0-7:  no or low risk or alcohol related problems. Score between 8-15:  moderate risk of alcohol related problems. Score between 16-19:  high risk of alcohol related problems. Score 20 or above:  warrants further diagnostic evaluation for alcohol dependence and treatment.  CLINICAL FACTORS:   Bipolar Disorder:   Mixed State Alcohol/Substance Abuse/Dependencies More than one psychiatric diagnosis Previous Psychiatric Diagnoses and Treatments Medical Diagnoses and Treatments/Surgeries   Musculoskeletal: Strength & Muscle Tone: within normal limits Gait & Station: normal Patient leans: N/A  Psychiatric Specialty Exam:  Presentation  General Appearance: Disheveled  Eye Contact:Good  Speech:Clear and Coherent; Normal Rate  Speech Volume:Normal  Handedness:Right   Mood and Affect  Mood:Anxious; Depressed  Affect:Congruent   Thought Process  Thought Processes:Coherent; Goal Directed  Descriptions of Associations:Intact  Orientation:Full (Time, Place and Person)  Thought Content:Logical  History of Schizophrenia/Schizoaffective  disorder:Yes  Duration of Psychotic Symptoms:Greater than six months  Hallucinations:Hallucinations: None Description of Auditory Hallucinations: NA Description of Visual Hallucinations: NA  Ideas of Reference:None  Suicidal Thoughts:Suicidal Thoughts: No SI Passive Intent and/or Plan: Without Intent; Without Plan; Without Means to Carry Out; Without Access to Means  Homicidal Thoughts:Homicidal Thoughts: No   Sensorium  Memory:Recent Good; Remote Fair; Immediate Good  Judgment:Fair  Insight:Fair   Executive Functions  Concentration:Fair  Attention Span:Fair  [place]  Language:Good   Psychomotor Activity  Psychomotor Activity:Psychomotor Activity: Normal   Assets  Assets:Communication Skills; Desire for Improvement; Financial Resources/Insurance; Physical Health; Resilience   Sleep  Sleep:Sleep: Poor Number of Hours of Sleep: 4   Physical Exam: See H&P.  Blood pressure 108/73, pulse 83, temperature 97.6 F (36.4 C), temperature source Oral, resp. rate 18, height 6\' 2"  (1.88 m), weight 104.3 kg, SpO2 97 %. Body mass index is 29.53 kg/m.  COGNITIVE FEATURES THAT CONTRIBUTE TO RISK:  Closed-mindedness, Loss of executive function, Polarized thinking, and Thought constriction (tunnel vision)    SUICIDE RISK:   Severe:  Frequent, intense, and enduring suicidal ideation, specific plan, no subjective intent, but some objective markers of intent (i.e., choice of lethal method), the method is accessible, some limited preparatory behavior, evidence of impaired self-control, severe dysphoria/symptomatology, multiple risk factors present, and few if any protective factors, particularly a lack of social support.  PLAN OF CARE: See H&P  I certify that inpatient services furnished can reasonably be expected to improve the patient's condition.   Lindell Spar, NP, pmhnp, fnp-bc 08/31/2021, 2:02 PM

## 2021-08-31 NOTE — Progress Notes (Signed)
D: Pt alert and oriented. Pt rates his anxiety/depression 7/10 at time of assessment -2105. Pt rates his R foot pain 7/10 at time of assessment -2105. Pt denies experiencing any SI/HI, or AVH at this time, described his mood at time of assessment as "Unsure."    A: Scheduled medications administered, 2105 - Tylenol 650 mg po prn given for c/o right foot pain 7/10, 2332 - Maalox/Mylanta 30 ml Susp given for c/o indigestion. Prns noted as effective at reassessment. Support and encouragement provided. Routine safety checks conducted q15 minutes.  R: No adverse drug reactions noted. Pt verbally contracts for safety, remains complaint with medications, interacts appropriately with others on the unit. Pt remains safe at this time.

## 2021-08-31 NOTE — H&P (Signed)
Psychiatric Admission Assessment Adult  Patient Identification: Dylan [name]  MRN:  [id number]  Date of Evaluation:  08/31/2021  Chief Complaint: Suicidal gestures with plans to cut his heart out with a knife or jump off a parking deck to his death.   Principal Diagnosis: Schizoaffective disorder, bipolar type ([place])  Diagnosis:  Principal Problem:   Schizoaffective disorder, bipolar type ([place])  History of Present Illness: This is one of several admission assessments from this [facility] for this 56 year old AA male with prior hx of Schizoaffective disorder & stimulant (cocaine) use disorder. He was known in this [facility] & in the other [place] symptoms worsening symptoms of his mental illness usually associated with medication non-compliance & ongoing substance use. He was admitted to the [facility] this time around from the [facility] with complaint of suicidal ideations/gestures with plans to cut out his heart with a knife or jump off a parking deck to his death. He was brought to the [facility] for evaluation of his symptoms. After his initial psychiatric evaluation at [facility], Quinto was transferred to the [facility] for further psychiatric evaluation/mood stabilization treatments. His UDS was positive for cocaine. During this evaluation, Wildon reports,   "The police made me get in their car the other day & they took me to the behavioral urgent care place. On that day, I had run out of the house I was staying at to the streets. A cop saw me, asked me what & where I was going? I told him that I needed to get a room to get away. I had just started hearing voices at the house where I was staying at. The voices has been going on since last month. I don't know why the voices came on & I was not able to make out what the voices were saying. After I got to the [facility], they checked me out & told me I was gonna come to this hospital. I think what happened was, I was on medications. They were helping me, but I ran out. I went to [facility] to ask  for refills, I was told that I did not have any refills that they will contact my doctor for refills. They pharmacy never heard from my doctor. I have not taken my medicines since the end of July. The medicines were helping me. They include Seroquel, Hydroxyzine & Trazodone). The medicines they put me on here ain't helping me. I took them last night & I did not sleep at all. I need to get back on my Seroquel, trazodone & hydroxyzine). I was doing well on these medicines until I ran out.  I ain't using cocaine no more. The last time I used cocaine was last May. I have had mental illness since the 90s. After discharge, I would like to get into another [facility] or a boarding house. My depression & anxiety right now are bot #10".   Associated Signs/Symptoms: Depression Symptoms:  depressed mood, insomnia, psychomotor retardation, difficulty concentrating, anxiety,  Duration of Depression Symptoms: Greater than two weeks  (Hypo) Manic Symptoms:  Hallucinations, Impulsivity, Irritable Mood, Labiality of Mood,  Anxiety Symptoms:  Excessive Worry,  Psychotic Symptoms:  Hallucinations: Auditory. Patient says he is unable to make out what the voices are saying.  PTSD Symptoms: Negative  Total Time spent with patient: 1 hour  Past Psychiatric History: Schizoaffective disorder bipolar type Prior inpatient treatment: Multiple previous psychiatric admissions with last admission here at  [facility] in March of this year, 2023, [facility] on 04/28/2020  Prior rehab hx: Was living at an [facility] prior to this hospitalization. Psychotherapy hx: Received few months of psychotherapy at [facility]. History of suicide: Multiple suicidal attempts with last suicide attempt last year.  Reports suicidal attempts with overdose and 1 time he went to the top of apartment building. History of homicide: None Psychiatric medication compliance history: Has hx of non-adherent to medication managemnt.   Is the patient at risk to  self? No.  Has the patient been a risk to self in the past 6 months? Yes.    Has the patient been a risk to self within the distant past? Yes.    Is the patient a risk to others? No.  Has the patient been a risk to others in the past 6 months? No.  Has the patient been a risk to others within the distant past? No.   Prior Inpatient Therapy: [facility] x multiple times. Prior Outpatient Therapy: [facility]  Alcohol Screening: Reports occasional use of alcohol  Substance Abuse History in the last 12 months:  Yes.    Consequences of Substance Abuse: Discussed with patient during this admission evaluation. Medical Consequences:  Liver damage, Possible death by overdose Legal Consequences:  Arrests, jail time, Loss of driving privilege. Family Consequences:  Family discord, divorce and or separation.   Previous Psychotropic Medications: Yes  (Seroquel, Hydroxyzine, Trazodone).  Psychological Evaluations: No   Past Medical History:  Past Medical History:  Diagnosis Date   Anxiety    Arthritis    Bipolar 1 disorder ([place])    Colon polyps    Depression    GERD (gastroesophageal reflux disease)    Hepatitis B    Human immunodeficiency virus (HIV) ([place])    Hyperlipidemia    Hypertension    Neuromuscular disorder ([place])    neuropathy   Neuropathy    Pre-diabetes    Schizophrenia ([place])     Past Surgical History:  Procedure Laterality Date   FOOT ARTHRODESIS Right 09/17/2019   Procedure: FUSION RIGHT LISFRANC JOINT;  Surgeon: Newt Minion, MD;  Location: [place];  Service: Orthopedics;  Laterality: Right;   FOOT ARTHRODESIS Right 09/2019   HEMORROIDECTOMY     IR FLUORO GUIDE CV LINE RIGHT  02/22/2017   IR US GUIDE VASC ACCESS RIGHT  02/22/2017   TUMOR REMOVAL     From Chest   Family History:  Family History  Problem Relation Age of Onset   CAD Mother    Diabetes Father    Colon cancer Maternal Aunt 60   Diabetes Maternal Aunt    Heart disease Maternal Uncle    Esophageal cancer Neg Hx     Rectal cancer Neg Hx    Stomach cancer Neg Hx    Family Psychiatric  History: Medical: Father died in 2000/03/27 due to stroke Psych: Mom has mental issues -but he does not know diagnosis.  Uncle has?  Schizophrenia.  No suicidal attempt in the family Substance use family hx: Unknown  Tobacco Screening: "I smoke about a pack of cigarettes daily".  Social History: Married, had 1 daughter (deceased), employed, currently homeless". Social History   Substance and Sexual Activity  Alcohol Use Yes   Comment: occassional drinker     Social History   Substance and Sexual Activity  Drug Use Yes   Types: Cocaine   Comment: last use was Friday 03/05/2020    Additional Social History:  Allergies:   Allergies  Allergen Reactions   Penicillins Other (See Comments)    Childhood  allergy   Pork-Derived Products Other (See Comments)    Patient preference   Latex Rash   Tape Rash   Lab Results:  Results for orders placed or performed during the hospital encounter of 08/30/21 (from the past 48 hour(s))  TSH     Status: None   Collection Time: 08/31/21  6:42 AM  Result Value Ref Range   TSH 2.828 0.350 - 4.500 uIU/mL    Comment: Performed by a 3rd Generation assay with a functional sensitivity of <=0.01 uIU/mL. Performed at [facility], [place] [address], [place], [place] [address]    Blood Alcohol level:  Lab Results  Component Value Date   ETH <10 07/02/2021   ETH <10 90/24/0973   Metabolic Disorder Labs:  Lab Results  Component Value Date   HGBA1C 5.7 (H) 03/13/2021   MPG 116.89 03/13/2021   MPG 125.5 10/26/2020   Lab Results  Component Value Date   PROLACTIN 12.1 03/08/2020   PROLACTIN 2.6 (L) 11/09/2019   Lab Results  Component Value Date   CHOL 244 (H) 08/28/2021   TRIG 65 08/28/2021   HDL 51 08/28/2021   CHOLHDL 4.8 08/28/2021   VLDL 13 08/28/2021   LDLCALC 180 (H) 08/28/2021   LDLCALC 75 03/17/2021   Current Medications: Current  Facility-Administered Medications  Medication Dose Route Frequency Provider Last Rate Last Admin   acetaminophen (TYLENOL) tablet 650 mg  650 mg Oral Q6H PRN Corky Sox, MD   650 mg at 08/31/21 1135   alum & mag hydroxide-simeth (MAALOX/MYLANTA) 200-200-20 MG/5ML suspension 30 mL  30 mL Oral Q4H PRN Corky Sox, MD   30 mL at 08/30/21 2332   bictegravir-emtricitabine-tenofovir AF (BIKTARVY) 50-200-25 MG per tablet 1 tablet  1 tablet Oral Daily Corky Sox, MD   1 tablet at 08/31/21 0816   gabapentin (NEURONTIN) capsule 300 mg  300 mg Oral TID Corky Sox, MD   300 mg at 08/31/21 1134   hydrOXYzine (ATARAX) tablet 25 mg  25 mg Oral TID PRN Corky Sox, MD   25 mg at 08/31/21 0818   magnesium hydroxide (MILK OF MAGNESIA) suspension 30 mL  30 mL Oral Daily PRN Corky Sox, MD       mupirocin ointment (BACTROBAN) 2 % 1 Application  1 Application Topical Daily Corky Sox, MD       nicotine polacrilex (NICORETTE) gum 2 mg  2 mg Oral PRN Winfred Leeds, Nadir, MD   2 mg at 08/31/21 0818   QUEtiapine (SEROQUEL) tablet 100 mg  100 mg Oral QHS Jaselle Pryer, Herbert Pun I, NP       rosuvastatin (CRESTOR) tablet 10 mg  10 mg Oral Daily Corky Sox, MD   10 mg at 08/31/21 0817   traZODone (DESYREL) tablet 100 mg  100 mg Oral QHS Corky Sox, MD   100 mg at 08/30/21 2105   PTA Medications: Medications Prior to Admission  Medication Sig Dispense Refill Last Dose   bictegravir-emtricitabine-tenofovir AF (BIKTARVY) 50-200-25 MG TABS tablet Take 1 tablet by mouth daily. 30 tablet 5    gabapentin (NEURONTIN) 300 MG capsule Take 1 capsule (300 mg total) by mouth 3 (three) times daily. 90 capsule 0    hydrOXYzine (ATARAX) 25 MG tablet Take 1 tablet (25 mg total) by mouth 3 (three) times daily as needed for anxiety. 30 tablet 0    mupirocin ointment (BACTROBAN) 2 % Apply 1 Application topically daily. 22 g 0    paliperidone (INVEGA) 6 MG 24 hr tablet Take 1 tablet (  6 mg total) by mouth at bedtime.       rosuvastatin (CRESTOR) 10 MG tablet Take 1 tablet (10 mg total) by mouth daily. 90 tablet 1    traZODone (DESYREL) 100 MG tablet Take 1 tablet (100 mg total) by mouth at bedtime. 30 tablet 0    Musculoskeletal: Strength & Muscle Tone: within normal limits Gait & Station: normal Patient leans: N/A  Psychiatric Specialty Exam:  Presentation  General Appearance: Disheveled  Eye Contact:Good  Speech:Clear and Coherent; Normal Rate  Speech Volume:Normal  Handedness:Right  Mood and Affect  Mood:Anxious; Depressed  Affect:Congruent  Thought Process  Thought Processes:Coherent; Goal Directed  Duration of Psychotic Symptoms: Greater than six months  Past Diagnosis of Schizophrenia or Psychoactive disorder: Yes  Descriptions of Associations:Intact  Orientation:Full (Time, Place and Person)  Thought Content:Logical  Hallucinations:Hallucinations: None Description of Auditory Hallucinations: NA Description of Visual Hallucinations: NA  Ideas of Reference:None  Suicidal Thoughts:Suicidal Thoughts: No SI Passive Intent and/or Plan: Without Intent; Without Plan; Without Means to Carry Out; Without Access to Means  Homicidal Thoughts:Homicidal Thoughts: No   Sensorium  Memory:Recent Good; Remote Fair; Immediate Good  Judgment:Fair  Insight:Fair   Executive Functions  Concentration:Fair  Attention Span:Fair  [place]  Language:Good  Psychomotor Activity  Psychomotor Activity:Psychomotor Activity: Normal   Assets  Assets:Communication Skills; Desire for Improvement; Financial Resources/Insurance; Physical Health; Resilience  Sleep  Sleep:Sleep: Poor Number of Hours of Sleep: 4  Physical Exam: Physical Exam Vitals and nursing note reviewed.  HENT:     Nose: Nose normal.     Mouth/Throat:     Pharynx: Oropharynx is clear.  Eyes:     Pupils: Pupils are equal, round, and reactive to light.  Cardiovascular:     Rate and  Rhythm: Normal rate.     Pulses: Normal pulses.  Pulmonary:     Effort: Pulmonary effort is normal.  Genitourinary:    Comments: Deferred Musculoskeletal:        General: Normal range of motion.     Cervical back: Normal range of motion.  Skin:    General: Skin is warm and dry.  Neurological:     General: No focal deficit present.     Mental Status: He is alert and oriented to person, place, and time.    Review of Systems  Constitutional:  Negative for chills, diaphoresis and fever.  HENT:  Negative for congestion and sore throat.   Respiratory:  Negative for cough, shortness of breath and wheezing.   Cardiovascular:  Negative for chest pain and palpitations.  Gastrointestinal:  Negative for abdominal pain, constipation, diarrhea, heartburn, nausea and vomiting.  Musculoskeletal:  Negative for joint pain and myalgias.  Skin: Negative.   Neurological:  Negative for dizziness, tingling, tremors, sensory change, speech change, focal weakness, seizures, loss of consciousness, weakness and headaches.  Endo/Heme/Allergies:        Allergies: PCN.  Food allergies: Pork.   Other allergies: Latex, tape.   Psychiatric/Behavioral:  Positive for depression and substance abuse (UDS (+) for cocaine.). Negative for memory loss. The patient is nervous/anxious and has insomnia.    Blood pressure 108/73, pulse 83, temperature 97.6 F (36.4 C), temperature source Oral, resp. rate 18, height 6\' 2"  (1.88 m), weight 104.3 kg, SpO2 97 %. Body mass index is 29.53 kg/m.  Treatment Plan Summary: Daily contact with patient to assess and evaluate symptoms and progress in treatment and Medication management.    PLAN Safety and Monitoring: Voluntary admission to  inpatient psychiatric unit for safety, stabilization and treatment Daily contact with patient to assess and evaluate symptoms and progress in treatment Patient's case to be discussed in multi-disciplinary team meeting Observation Level : q15  minute checks Vital signs: q12 hours Precautions: Safety  Long Term Goal(s): Improvement in symptoms so as ready for discharge  Diagnoses.  Principal Problem:   Schizoaffective disorder, bipolar type ([place])  Medications (Home meds restarted) -RestartedSeroquel 100 mg po Q bedtime for psychosis -Restarted Trazodone 100 mg nightly PRN for insomnia -Continue Gabapentin 300 mg po tid for agitation. -Continue Hydroxyzine 25 mg po tid prn for anxiety.  -Restarted Nicorette gum 2 mg prn for nicotine withdrawal.   Other medical issues. -Rosuvastatin 10 mg po Q evening s for hyperlipidemia.  -Continue Bactroban ointment, apply to affected area daily.. -Restarted Biktarvy 50-200-25 mg po daily for HIV disease.   Other PRNS -Continue Tylenol 650 mg every 6 hours PRN for mild pain -Continue Maalox 30 ml Q 4 hrs PRN for indigestion -Continue MOM 30 ml po Q 6 hrs for constipation  Discharge Planning: Social work and case management to assist with discharge planning and identification of hospital follow-up needs prior to discharge Estimated LOS: 5-7 days Discharge Concerns: Need to establish a safety plan; Medication compliance and effectiveness Discharge Goals: Return home with outpatient referrals for mental health follow-up including medication management/psychotherapy   Observation Level/Precautions:  15 minute checks  Laboratory:   Per ED, current lab results reviewed. Will obtain U/A   Psychotherapy: Enrolled in the group, milieu  Medications: See MAR.  Consultations: As needed.  Discharge Concerns: Safety, mood stability, maintaining sobriety.  Estimated LOS: 3-5 days  Other: NA    Physician Treatment Plan for Primary Diagnosis: Schizoaffective disorder, bipolar type ([place])  Long Term Goal(s): Improvement in symptoms so as ready for discharge  Short Term Goals: Ability to identify changes in lifestyle to reduce recurrence of condition will improve, Ability to verbalize feelings  will improve, Ability to disclose and discuss suicidal ideas, and Ability to demonstrate self-control will improve  Physician Treatment Plan for Secondary Diagnosis: Principal Problem:   Schizoaffective disorder, bipolar type ([place])  Long Term Goal(s): Improvement in symptoms so as ready for discharge  Short Term Goals: Ability to identify and develop effective coping behaviors will improve, Ability to maintain clinical measurements within normal limits will improve, Compliance with prescribed medications will improve, and Ability to identify triggers associated with substance abuse/mental health issues will improve  I certify that inpatient services furnished can reasonably be expected to improve the patient's condition.    Lindell Spar, NP, pmhnp, fnp-bc. 8/23/202312:59 PM

## 2021-08-31 NOTE — Progress Notes (Signed)
D- Patient alert and oriented x4. Patient in depressed mood.  Denies SI, HI, AVH. Pt anxious and stated he wants to be able to sleep tonight. Pt states he has not yet had a good night sleep.    A- Scheduled medications administered to patient, per MD orders.Routine safety checks conducted every 15 minutes. Pt given PRN medications for insomnia to assist with complaint. Patient informed to notify staff with problems or concerns.   R- Patient compliant with medications and verbalized understanding treatment plan. Patient receptive, calm, and cooperative.    08/31/21 2050  Psych Admission Type (Psych Patients Only)  Admission Status Voluntary  Psychosocial Assessment  Patient Complaints Anxiety;Insomnia;Depression  Eye Contact Fair  Facial Expression Animated  Affect Depressed  Speech Logical/coherent  Interaction Forwards little  Motor Activity Other (Comment) (WDL)  Appearance/Hygiene Unremarkable  Behavior Characteristics Cooperative  Mood Depressed  Thought Process  Coherency WDL  Content WDL  Delusions None reported or observed  Perception WDL  Hallucination None reported or observed  Judgment Impaired  Confusion None  Danger to Self  Current suicidal ideation? Denies  Danger to Others  Danger to Others None reported or observed

## 2021-08-31 NOTE — Progress Notes (Signed)
Pt denies SI/HI/AVH and verbally agrees to approach staff if these become apparent or before harming themselves/others. Rates depression 10/10. Rates anxiety 7/10. Rates pain 7/10. Pt wrote that his goal is, "I don't know" and "just need sleep." Pt slept for the morning but has been up for the rest of the day for the most part. Pt has been getting along well with other pts. Pt was stating at one point that he wanted to leave because he was afraid that other people at [facility] would steal his stuff. We were able to get the number to the house and they were able to take care of his things. Pt stated that he felt a lot better. Scheduled medications administered to pt, per MD orders. RN provided support and encouragement to pt. Q15 min safety checks implemented and continued. Pt safe on the unit. RN will continue to monitor and intervene as needed.   08/31/21 0818  Psych Admission Type (Psych Patients Only)  Admission Status Voluntary  Psychosocial Assessment  Patient Complaints Anxiety;Depression  Eye Contact Fair  Facial Expression Animated  Affect Anxious;Appropriate to circumstance  Speech Logical/coherent  Interaction Assertive  Motor Activity Slow  Appearance/Hygiene Unremarkable  Behavior Characteristics Cooperative;Appropriate to situation;Anxious  Mood Anxious  Thought Process  Coherency WDL  Content WDL  Delusions None reported or observed  Perception WDL  Hallucination None reported or observed  Judgment Impaired  Confusion None  Danger to Self  Current suicidal ideation? Denies  Danger to Others  Danger to Others None reported or observed
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
## 2021-09-01 LAB — HEMOGLOBIN A1C
Hgb A1c MFr Bld: 5.8 % — ABNORMAL HIGH (ref 4.8–5.6)
Mean Plasma Glucose: 119.76 mg/dL

## 2021-09-01 MED ORDER — TRAZODONE HCL 150 MG PO TABS
150.0000 mg | ORAL_TABLET | Freq: Every day | ORAL | Status: DC
  Administered 2021-09-01: 150 mg via ORAL
  Filled 2021-09-01 (×4): qty 1

## 2021-09-01 MED ORDER — TRIAMCINOLONE ACETONIDE 0.1 % EX OINT
TOPICAL_OINTMENT | Freq: Two times a day (BID) | CUTANEOUS | Status: DC
  Filled 2021-09-01 (×2): qty 15

## 2021-09-01 MED ORDER — QUETIAPINE FUMARATE 50 MG PO TABS
150.0000 mg | ORAL_TABLET | Freq: Every day | ORAL | Status: DC
  Administered 2021-09-01: 150 mg via ORAL
  Filled 2021-09-01 (×4): qty 1
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
## 2021-09-01 NOTE — Progress Notes (Signed)
[facility] MD Progress Note  09/01/2021 9:06 AM Dylan [name]  MRN:  [id number]  Reason for admission: 56 year old AA male with prior hx of Schizoaffective disorder & stimulant (cocaine) use disorder. He was known in this [facility] & in the other [place] symptoms worsening symptoms of his mental illness usually associated with medication non-compliance & ongoing substance use. He was admitted to the [facility] this time around from the [facility] with complaint of suicidal ideations/gestures with plans to cut out his heart with a knife or jump off a parking deck to his death. He was brought to the [facility] for evaluation of his symptoms. After his initial psychiatric evaluation at [facility], Dylan [name] was transferred to the [facility] for further psychiatric evaluation/mood stabilization treatments. His UDS was positive for cocaine.  24 hour chart review: BP for the past 24 hrs have been mostly WNL.  Patient's symptoms seem to be improving gradually. Pt is continuing to attend unit group sessions, and as per nursing documentation, he is continuing to be interactive. Pt has continued to be compliant with all scheduled medications over the past 24 hrs.  Daily notes: Dylan [name] is seen in his room. He presents alert, oriented & aware of situation. He presents with a good affect, good eye contact & verbally responsive. He is visible on the unit, attending group sessions. He reports today that he is feeling a lot better now that he is back on his medications. He expressed that he does not need any Seroquel in the mornings as it has the tendency to make him drowsy. He says he does well on Seroquel 150 mg & Trazodone 150 mg at bedtime respectively. He continues to endorse some auditory hallucination which he say are not really bothersome as he has the tendency to "X" them out. He report improved mood. He complained about his discolored right toe nails & worried that they may get worse. He was ordered Bactroban application, but declines to use it. Assessment  reveals what seem like a growth of fungi to his right toes. He is started on Triamcinolone ointment for it to be applied bid. Dylan [name] reports sleeping well. He reports good appetite. He denies any SIHI, VH, delusional thoughts or paranoia. He does not appear to be responding to any internal stimuli. His Seroquel & Trazodone were increased to 150 mg respectively to start to night. Reviewed current lab results, no changes. Vital signs remain stable. Will continue current plan of care as already in progress.  Principal Problem: Schizoaffective disorder, bipolar type ([place])  Diagnosis: Principal Problem:   Schizoaffective disorder, bipolar type ([place])  Total Time spent with patient:  35 minutes  Past Psychiatric History: See H&P.  Past Medical History:  Past Medical History:  Diagnosis Date   Anxiety    Arthritis    Bipolar 1 disorder ([place])    Colon polyps    Depression    GERD (gastroesophageal reflux disease)    Hepatitis B    Human immunodeficiency virus (HIV) ([place])    Hyperlipidemia    Hypertension    Neuromuscular disorder ([place])    neuropathy   Neuropathy    Pre-diabetes    Schizophrenia ([place])     Past Surgical History:  Procedure Laterality Date   FOOT ARTHRODESIS Right 09/17/2019   Procedure: FUSION RIGHT LISFRANC JOINT;  Surgeon: Newt Minion, MD;  Location: [place];  Service: Orthopedics;  Laterality: Right;   FOOT ARTHRODESIS Right 09/2019   HEMORROIDECTOMY     IR FLUORO GUIDE CV LINE  RIGHT  02/22/2017   IR US GUIDE VASC ACCESS RIGHT  02/22/2017   TUMOR REMOVAL     From Chest   Family History:  Family History  Problem Relation Age of Onset   CAD Mother    Diabetes Father    Colon cancer Maternal Aunt 60   Diabetes Maternal Aunt    Heart disease Maternal Uncle    Esophageal cancer Neg Hx    Rectal cancer Neg Hx    Stomach cancer Neg Hx    Family Psychiatric  History: See H&P.   Social History:  Social History   Substance and Sexual Activity  Alcohol Use Yes    Comment: occassional drinker     Social History   Substance and Sexual Activity  Drug Use Yes   Types: Cocaine   Comment: last use was Friday 03/05/2020    Social History   Socioeconomic History   Marital status: Married    Spouse name: Not on file   Number of children: 1   Years of education: Not on file   Highest education level: Not on file  Occupational History   Not on file  Tobacco Use   Smoking status: Every Day    Packs/day: 0.50    Years: 25.00    Total pack years: 12.50    Types: E-cigarettes, Cigarettes   Smokeless tobacco: Never   Tobacco comments:    has patches when he is ready to quit  Vaping Use   Vaping Use: Every day   Start date: 12/09/2020   Substances: Nicotine  Substance and Sexual Activity   Alcohol use: Yes    Comment: occassional drinker   Drug use: Yes    Types: Cocaine    Comment: last use was Friday 03/05/2020   Sexual activity: Yes    Partners: Female    Comment: condoms given  Other Topics Concern   Not on file  Social History Narrative   Not on file   Social Determinants of Health   Financial Resource Strain: Not on file  Food Insecurity: Not on file  Transportation Needs: Not on file  Physical Activity: Not on file  Stress: Not on file  Social Connections: Not on file   Additional Social History:   Sleep: Good  Appetite:  Good  Current Medications: Current Facility-Administered Medications  Medication Dose Route Frequency Provider Last Rate Last Admin   acetaminophen (TYLENOL) tablet 650 mg  650 mg Oral Q6H PRN Corky Sox, MD   650 mg at 08/31/21 1135   alum & mag hydroxide-simeth (MAALOX/MYLANTA) 200-200-20 MG/5ML suspension 30 mL  30 mL Oral Q4H PRN Corky Sox, MD   30 mL at 08/30/21 2332   bictegravir-emtricitabine-tenofovir AF (BIKTARVY) 50-200-25 MG per tablet 1 tablet  1 tablet Oral Daily Corky Sox, MD   1 tablet at 09/01/21 0803   gabapentin (NEURONTIN) capsule 300 mg  300 mg Oral TID Corky Sox, MD   300 mg at 09/01/21 0803   hydrOXYzine (ATARAX) tablet 25 mg  25 mg Oral TID PRN Corky Sox, MD   25 mg at 08/31/21 2113   magnesium hydroxide (MILK OF MAGNESIA) suspension 30 mL  30 mL Oral Daily PRN Corky Sox, MD       mupirocin ointment (BACTROBAN) 2 % 1 Application  1 Application Topical Daily Corky Sox, MD   1 Application at 11/91/47 0804   nicotine polacrilex (NICORETTE) gum 2 mg  2 mg Oral PRN Dian Situ, MD   2 mg at  08/31/21 0818   QUEtiapine (SEROQUEL) tablet 100 mg  100 mg Oral QHS Clementina Mareno I, NP   100 mg at 08/31/21 2052   rosuvastatin (CRESTOR) tablet 10 mg  10 mg Oral Daily Corky Sox, MD   10 mg at 09/01/21 0803   traZODone (DESYREL) tablet 100 mg  100 mg Oral QHS Corky Sox, MD   100 mg at 08/31/21 2053   Lab Results:  Results for orders placed or performed during the hospital encounter of 08/30/21 (from the past 48 hour(s))  TSH     Status: None   Collection Time: 08/31/21  6:42 AM  Result Value Ref Range   TSH 2.828 0.350 - 4.500 uIU/mL    Comment: Performed by a 3rd Generation assay with a functional sensitivity of <=0.01 uIU/mL. Performed at [facility], [place] [address], [place], [place] [address]   Rapid urine drug screen (hospital performed) not at [facility]     Status: None   Collection Time: 08/31/21 12:30 PM  Result Value Ref Range   Opiates NONE DETECTED NONE DETECTED   Cocaine NONE DETECTED NONE DETECTED   Benzodiazepines NONE DETECTED NONE DETECTED   Amphetamines NONE DETECTED NONE DETECTED   Tetrahydrocannabinol NONE DETECTED NONE DETECTED   Barbiturates NONE DETECTED NONE DETECTED    Comment: (NOTE) DRUG SCREEN FOR MEDICAL PURPOSES ONLY.  IF CONFIRMATION IS NEEDED FOR ANY PURPOSE, NOTIFY LAB WITHIN 5 DAYS.  LOWEST DETECTABLE LIMITS FOR URINE DRUG SCREEN Drug Class                     Cutoff (ng/mL) Amphetamine and metabolites    1000 Barbiturate and metabolites    200 Benzodiazepine                  712 Tricyclics and metabolites     300 Opiates and metabolites        300 Cocaine and metabolites        300 THC                            50 Performed at [facility], [place] [address], [place], [place] [address]   Urinalysis, Routine w reflex microscopic Urine, Clean Catch     Status: Abnormal   Collection Time: 08/31/21 12:30 PM  Result Value Ref Range   Color, Urine STRAW (A) YELLOW   APPearance CLEAR CLEAR   Specific Gravity, Urine 1.009 1.005 - 1.030   pH 7.0 5.0 - 8.0   Glucose, UA NEGATIVE NEGATIVE mg/dL   Hgb urine dipstick NEGATIVE NEGATIVE   Bilirubin Urine NEGATIVE NEGATIVE   Ketones, ur NEGATIVE NEGATIVE mg/dL   Protein, ur NEGATIVE NEGATIVE mg/dL   Nitrite NEGATIVE NEGATIVE   Leukocytes,Ua NEGATIVE NEGATIVE    Comment: Performed at [facility] [address], [place], [place] [address]   Blood Alcohol level:  Lab Results  Component Value Date   [facility] <10 07/02/2021   ETH <10 25/05/3974   Metabolic Disorder Labs: Lab Results  Component Value Date   HGBA1C 5.7 (H) 03/13/2021   MPG 116.89 03/13/2021   MPG 125.5 10/26/2020   Lab Results  Component Value Date   PROLACTIN 12.1 03/08/2020   PROLACTIN 2.6 (L) 11/09/2019   Lab Results  Component Value Date   CHOL 244 (H) 08/28/2021   TRIG 65 08/28/2021   HDL 51 08/28/2021   CHOLHDL 4.8 08/28/2021   VLDL 13  08/28/2021   LDLCALC 180 (H) 08/28/2021   LDLCALC 75 03/17/2021   Physical Findings: AIMS: Facial and Oral Movements Muscles of Facial Expression: None, normal Lips and Perioral Area: None, normal Jaw: None, normal Tongue: None, normal,Extremity Movements Upper (arms, wrists, hands, fingers): None, normal Lower (legs, knees, ankles, toes): None, normal, Trunk Movements Neck, shoulders, hips: None, normal, Overall Severity Severity of abnormal movements (highest score from questions above): None, normal Incapacitation due to abnormal movements: None,  normal Patient's awareness of abnormal movements (rate only patient's report): No Awareness, Dental Status Current problems with teeth and/or dentures?: No Does patient usually wear dentures?: No  CIWA:    COWS:     Musculoskeletal: Strength & Muscle Tone: within normal limits Gait & Station: normal Patient leans: N/A  Psychiatric Specialty Exam:  Presentation  General Appearance: Disheveled  Eye Contact:Good  Speech:Clear and Coherent; Normal Rate  Speech Volume:Normal  Handedness:Right  Mood and Affect  Mood:Anxious; Depressed  Affect:Congruent  Thought Process  Thought Processes:Coherent; Goal Directed  Descriptions of Associations:Intact  Orientation:Full (Time, Place and Person)  Thought Content:Logical  History of Schizophrenia/Schizoaffective disorder:Yes  Duration of Psychotic Symptoms:Greater than six months  Hallucinations:Hallucinations: None Description of Auditory Hallucinations: NA Description of Visual Hallucinations: NA  Ideas of Reference:None  Suicidal Thoughts:Suicidal Thoughts: No SI Passive Intent and/or Plan: Without Intent; Without Plan; Without Means to Carry Out; Without Access to Means  Homicidal Thoughts:Homicidal Thoughts: No  Sensorium  Memory:Recent Good; Remote Fair; Immediate Good  Judgment:Fair  Insight:Fair  Executive Functions  Concentration:Fair  Attention Span:Fair  [place]  Language:Good  Psychomotor Activity  Psychomotor Activity:Psychomotor Activity: Normal  Assets  Assets:Communication Skills; Desire for Improvement; Financial Resources/Insurance; Physical Health; Resilience  Sleep  Sleep:Sleep: Poor Number of Hours of Sleep: 4  Physical Exam: Physical Exam Vitals and nursing note reviewed.  HENT:     Head: Normocephalic.     Nose: Nose normal.     Mouth/Throat:     Pharynx: Oropharynx is clear.  Eyes:     Pupils: Pupils are equal, round, and reactive to  light.  Cardiovascular:     Rate and Rhythm: Normal rate.     Pulses: Normal pulses.  Pulmonary:     Effort: Pulmonary effort is normal.  Musculoskeletal:        General: Normal range of motion.     Cervical back: Normal range of motion.  Skin:    General: Skin is warm and dry.  Neurological:     General: No focal deficit present.     Mental Status: He is alert and oriented to person, place, and time.    Review of Systems  Constitutional:  Negative for chills and fever.  Respiratory:  Negative for cough, shortness of breath and wheezing.   Cardiovascular:  Negative for chest pain and palpitations.  Gastrointestinal:  Negative for abdominal pain, constipation, diarrhea, heartburn, nausea and vomiting.  Musculoskeletal:  Negative for joint pain and myalgias.  Neurological:  Negative for dizziness, tingling, tremors, sensory change, speech change, focal weakness, seizures, loss of consciousness, weakness and headaches.  Psychiatric/Behavioral:  Positive for hallucinations ("I do X the voices out".) and substance abuse (Hx. cocaine use disorder.). Negative for depression, memory loss and suicidal ideas. The patient is not nervous/anxious and does not have insomnia.    Blood pressure 119/79, pulse 69, temperature 97.6 F (36.4 C), temperature source Oral, resp. rate 18, height 6\' 2"  (1.88 m), weight 104.3 kg, SpO2 97 %. Body mass index is  29.53 kg/m.  Treatment Plan Summary: Daily contact with patient to assess and evaluate symptoms and progress in treatment and Medication management.  Continue inpatient hospitalization.  Will continue today 09/01/2021 plan as below except where it is noted.     PLAN Safety and Monitoring: Voluntary admission to inpatient psychiatric unit for safety, stabilization and treatment Patient's case discussed in multi-disciplinary team meeting Observation Level : q15 minute checks Vital signs: q12 hours Precautions: Safety   Long Term Goal(s):  Improvement in symptoms so as ready for discharge   Diagnoses.  Principal Problem:   Schizoaffective disorder, bipolar type (HCC)   Medications (Home meds restarted) -Increased Seroquel from 100 mg to 150 mg po Q bedtime for psychosis -Increased Trazodone from 100 mg to 150 mg po nightly PRN for insomnia -Continue Gabapentin 300 mg po tid for agitation. -Continue Hydroxyzine 25 mg po tid prn for anxiety.  -Continue Nicorette gum 2 mg prn for nicotine withdrawal.    Other medical issues. -Continue Rosuvastatin 10 mg po Q evening s for hyperlipidemia.  -Continue triamcinolone ointment, apply to affected area daily.. -Continue Biktarvy 50-200-25 mg po daily for HIV disease.    Other PRNS -Continue Tylenol 650 mg every 6 hours PRN for mild pain -Continue Maalox 30 ml Q 4 hrs PRN for indigestion -Continue MOM 30 ml po Q 6 hrs for constipation   Discharge Planning: Social work and case management to assist with discharge planning and identification of hospital follow-up needs prior to discharge Estimated LOS: 5-7 days Discharge Concerns: Need to establish a safety plan; Medication compliance and effectiveness Discharge Goals: Return home with outpatient referrals for mental health follow-up including medication management/psychotherapy     Lindell Spar, NP, pmhnp, fnp-bc 09/01/2021, 9:06 AM

## 2021-09-01 NOTE — BHH Group Notes (Signed)
Adult Psychoeducational Group Note  Date:  09/01/2021 Time:  2:32 PM  Group Topic/Focus:  Wellness Toolbox:   The focus of this group is to discuss various aspects of wellness, balancing those aspects and exploring ways to increase the ability to experience wellness.  Patients will create a wellness toolbox for use upon discharge.  Participation Level:  Active  Participation Quality:  Attentive  Affect:  Appropriate  Cognitive:  Appropriate  Insight: Appropriate  Engagement in Group:  Engaged  Modes of Intervention:  Activity  Additional Comments:  Patient attended and participated in therapeutic group activity.  Annie Sable 09/01/2021, 2:32 PM

## 2021-09-01 NOTE — BHH Group Notes (Signed)
Pt attended and contributed to group 

## 2021-09-02 DIAGNOSIS — F250 Schizoaffective disorder, bipolar type: Principal | ICD-10-CM

## 2021-09-02 MED ORDER — ROSUVASTATIN CALCIUM 10 MG PO TABS: 10.0000 mg | ORAL_TABLET | Freq: Every day | ORAL | 1 refills | Status: DC

## 2021-09-02 MED ORDER — QUETIAPINE FUMARATE 150 MG PO TABS: 150.0000 mg | ORAL_TABLET | Freq: Every day | ORAL | 0 refills | Status: DC

## 2021-09-02 MED ORDER — GABAPENTIN 300 MG PO CAPS: 300.0000 mg | ORAL_CAPSULE | Freq: Three times a day (TID) | ORAL | 0 refills | Status: DC

## 2021-09-02 MED ORDER — HYDROXYZINE HCL 25 MG PO TABS: 25.0000 mg | ORAL_TABLET | Freq: Three times a day (TID) | ORAL | 0 refills | Status: DC | PRN

## 2021-09-02 MED ORDER — TRAZODONE HCL 150 MG PO TABS
150.0000 mg | ORAL_TABLET | Freq: Every day | ORAL | 0 refills | Status: DC
Start: 2021-09-02 — End: 2022-04-04

## 2021-09-02 MED ORDER — NICOTINE POLACRILEX 2 MG MT GUM
2.0000 mg | CHEWING_GUM | OROMUCOSAL | 0 refills | Status: DC | PRN
Start: 2021-09-02 — End: 2021-11-08

## 2021-09-02 MED ORDER — TRIAMCINOLONE ACETONIDE 0.1 % EX OINT: TOPICAL_OINTMENT | Freq: Two times a day (BID) | CUTANEOUS | 0 refills | Status: DC

## 2021-09-02 NOTE — Progress Notes (Signed)
Dylan [name]  D/C'd Home per MD order.  Discussed with the patient and all questions fully answered. [facility] Discharge Suicide Risk Assessment  given to patient at discharge, and he denies SI/HI/AVH.    An After Visit Summary was printed and given to the patient. Patient received prescription.  D/c education completed with patient including follow up instructions, medication list, d/c activities limitations if indicated, with other d/c instructions as indicated by MD - patient able to verbalize understanding, all questions fully answered.   Patient instructed to return to ED, call 911, or call MD for any changes in condition.   Patient escorted to the main entrance, and D/C home via private auto.  Dylan [name] [name] 09/02/2021 1:20 PM

## 2021-09-02 NOTE — BHH Suicide Risk Assessment (Signed)
Suicide Risk Assessment  Discharge Assessment    [facility] Discharge Suicide Risk Assessment  Principal Problem: Schizoaffective disorder, bipolar type [facility])  Discharge Diagnoses: Principal Problem:   Schizoaffective disorder, bipolar type ([place])  Total Time spent with patient:  Greater than 30 minutes  Musculoskeletal: Strength & Muscle Tone: within normal limits Gait & Station: normal Patient leans: N/A  Psychiatric Specialty Exam  Presentation  General Appearance: Appropriate for Environment; Casual; Fairly Groomed  Eye Contact:Good  Speech:Clear and Coherent; Normal Rate  Speech Volume:Normal  Handedness:Right   Mood and Affect  Mood:Euthymic  Duration of Depression Symptoms: Greater than two weeks  Affect:Appropriate; Congruent   Thought Process  Thought Processes:Coherent; Linear; Goal Directed  Descriptions of Associations:Intact  Orientation:Full (Time, Place and Person)  Thought Content:Logical  History of Schizophrenia/Schizoaffective disorder:Yes  Duration of Psychotic Symptoms:Greater than six months  Hallucinations:Hallucinations: None Description of Auditory Hallucinations: NA Description of Visual Hallucinations: NA  Ideas of Reference:None  Suicidal Thoughts:Suicidal Thoughts: No SI Passive Intent and/or Plan: Without Intent; Without Plan; Without Means to Carry Out; Without Access to Means  Homicidal Thoughts:Homicidal Thoughts: No   Sensorium  Memory:Recent Good; Immediate Good; Remote Good  Judgment:Fair  Insight:Good   Executive Functions  Concentration:Good  Attention Span:Good  [place] of Knowledge:Good  Language:Good   Psychomotor Activity  Psychomotor Activity:Psychomotor Activity: Normal  Assets  Assets:Communication Skills; Desire for Improvement; Financial Resources/Insurance; Housing; Resilience; Social Support; Physical Health  Sleep  Sleep:Sleep: Good Number of Hours of Sleep: 7  Physical  Exam:  Blood pressure (!) 146/88, pulse 68, temperature 97.6 F (36.4 C), temperature source Oral, resp. rate 18, height 6\' 2"  (1.88 m), weight 104.3 kg, SpO2 97 %. Body mass index is 29.53 kg/m.  Mental Status Per Nursing Assessment::   On Admission:  Self-harm thoughts  Demographic Factors:  Adolescent or young adult and Low socioeconomic status  Loss Factors: Financial problems/change in socioeconomic status  Historical Factors: Impulsivity  Risk Reduction Factors:   Religious beliefs about death, Living with another person, especially a relative, Positive social support, Positive therapeutic relationship, and Positive coping skills or problem solving skills  Continued Clinical Symptoms:  Bipolar Disorder:   Mixed State Alcohol/Substance Abuse/Dependencies More than one psychiatric diagnosis Previous Psychiatric Diagnoses and Treatments  Cognitive Features That Contribute To Risk:  Closed-mindedness, Polarized thinking, and Thought constriction (tunnel vision)    Suicide Risk:  Minimal: No identifiable suicidal ideation.  Patients presenting with no risk factors but with morbid ruminations; may be classified as minimal risk based on the severity of the depressive symptoms   Follow-up Information     Charlott Rakes, MD Follow up on 09/26/2021.   Specialty: Family Medicine Why: You have an appointment on 09/26/21 at 1:30 pm. Contact information: [place] [address] [phone]         [facility]. Go to.   Specialty: Behavioral Health Why: As your account is inactive, you will need to go to this provider for a re-assessment, to obtain therapy and medication management services on Monday or Wednesday, arrive by 7:30 am.  Services are provided on a first come, first served basis. Contact information: [place] [phone]               Plan Of Care/Follow-up  recommendations:  See the follow-up recommendations above.  Lindell Spar, NP, pmhnp, fnp-bc. 09/02/2021, 12:02 PM

## 2021-09-02 NOTE — BHH Group Notes (Signed)
Adult Psychoeducational Group Note  Date:  09/02/2021 Time:  10:14 AM  Group Topic/Focus:  Goals Group:   The focus of this group is to help patients establish daily goals to achieve during treatment and discuss how the patient can incorporate goal setting into their daily lives to aide in recovery.  Participation Level:  Active  Participation Quality:  Appropriate  Affect:  Appropriate  Cognitive:  Appropriate  Insight: Appropriate  Engagement in Group:  Engaged  Modes of Intervention:  Discussion    Dub Mikes 09/02/2021, 10:14 AM

## 2021-09-02 NOTE — Progress Notes (Signed)
Chaplain engaged [place] in a conversation and provided reflective listening as he shared about his interests, his goals and his past work experience.  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  Chaplain provided emotional and spiritual support.  [address] [place] Pager, [phone]

## 2021-09-02 NOTE — Discharge Summary (Signed)
Physician Discharge Summary Note  Patient:  Dylan [name] is an 56 y.o., male  MRN:  [id number]  DOB:  1965-10-06  Patient phone:  [phone] (home)   Patient address:   [address] [place] [address],   Total Time spent with patient:  Greater than 30 minutes  Date of Admission:  08/30/2021  Date of Discharge: 09-02-21  Reason for Admission: Suicidal ideations/gestures with plans to cut out his heart with a knife or jump off a parking deck to his death.   Principal Problem: Schizoaffective disorder, bipolar type [facility])  Discharge Diagnoses: Principal Problem:   Schizoaffective disorder, bipolar type [facility])  Past Psychiatric History: Schizoaffective disorder bipolar type   Past Medical History:  Past Medical History:  Diagnosis Date   Anxiety    Arthritis    Bipolar 1 disorder ([place])    Colon polyps    Depression    GERD (gastroesophageal reflux disease)    Hepatitis B    Human immunodeficiency virus (HIV) ([place])    Hyperlipidemia    Hypertension    Neuromuscular disorder ([place])    neuropathy   Neuropathy    Pre-diabetes    Schizophrenia ([place])     Past Surgical History:  Procedure Laterality Date   FOOT ARTHRODESIS Right 09/17/2019   Procedure: FUSION RIGHT LISFRANC JOINT;  Surgeon: Newt Minion, MD;  Location: [place];  Service: Orthopedics;  Laterality: Right;   FOOT ARTHRODESIS Right 09/2019   HEMORROIDECTOMY     IR FLUORO GUIDE CV LINE RIGHT  02/22/2017   IR US GUIDE VASC ACCESS RIGHT  02/22/2017   TUMOR REMOVAL     From Chest   Family History:  Family History  Problem Relation Age of Onset   CAD Mother    Diabetes Father    Colon cancer Maternal Aunt 60   Diabetes Maternal Aunt    Heart disease Maternal Uncle    Esophageal cancer Neg Hx    Rectal cancer Neg Hx    Stomach cancer Neg Hx    Family Psychiatric  History: Medical: Father died in March 27, 2000 due to stroke Psych:  Mom has mental issues -but Dylan [name] does not know diagnosis.   Uncle has?   Schizophrenia.  No suicidal attempt in the family Substance use family hx: Unknown  Social History:  Social History   Substance and Sexual Activity  Alcohol Use Yes   Comment: occassional drinker     Social History   Substance and Sexual Activity  Drug Use Yes   Types: Cocaine   Comment: last use was Friday 03/05/2020    Social History   Socioeconomic History   Marital status: Married    Spouse name: Not on file   Number of children: 1   Years of education: Not on file   Highest education level: Not on file  Occupational History   Not on file  Tobacco Use   Smoking status: Every Day    Packs/day: 0.50    Years: 25.00    Total pack years: 12.50    Types: E-cigarettes, Cigarettes   Smokeless tobacco: Never   Tobacco comments:    has patches when Dylan [name] is ready to quit  Vaping Use   Vaping Use: Every day   Start date: 12/09/2020   Substances: Nicotine  Substance and Sexual Activity   Alcohol use: Yes    Comment: occassional drinker   Drug use: Yes    Types: Cocaine    Comment: last use was Friday 03/05/2020  Sexual activity: Yes    Partners: Female    Comment: condoms given  Other Topics Concern   Not on file  Social History Narrative   Not on file   Social Determinants of Health   Financial Resource Strain: Not on file  Food Insecurity: Not on file  Transportation Needs: Not on file  Physical Activity: Not on file  Stress: Not on file  Social Connections: Not on file   Hospital Course: (Per admission evaluation notes): 56 year old AA male with prior hx of Schizoaffective disorder & stimulant (cocaine) use disorder. Dylan [name] was known in this [facility] & in the other [place] symptoms worsening symptoms of his mental illness usually associated with medication non-compliance & ongoing substance use. Dylan [name] was admitted to the [facility] this time around from the [facility] with complaint of suicidal ideations/gestures with plans to cut out his heart with a knife or jump off a parking  deck to his death. Dylan [name] was brought to the [facility] for evaluation of his symptoms. After his initial psychiatric evaluation at [name], Dylan [name] was transferred to the [facility] for further psychiatric evaluation/mood stabilization treatments. His UDS was positive for cocaine.  Prior to this discharge, Dylan [name] was seen & evaluated for mental health stability. The current laboratory findings were reviewed (stable), nurses notes & vital signs were reviewed as well. There are no current mental health or medical issues that should prevent this discharge at this time. Patient is being discharged to continue mental health care as noted below.  This is one of several psychiatric admissions/discharge summaries from this [place] for this 56 year old AA male with hx of chronic mental illness, stimulant use disorder & multiple psychiatric admissions. Dylan [name] is known in this [facility] for worsening symptoms of Schizoaffective disorder, bipolar-type. Dylan [name] has been tried on multiple psychotropic medications for his symptoms & Dylan [name] is known to be non-compliant to his treatment regimen pre-disposing him to relapses/recurrent of symptoms & frequent hospitalizations. Dylan [name] was brought to the [facility] this time around for evaluation & treatment for suicidal ideations/gestures with plans to cut out his heart with a knife or jump off a parking deck to his death.   After evaluation of his presenting symptoms this time around, Dylan [name] was recommended for mood stabilization treatments by his treatment team. The medication regimen for his presenting symptoms were discussed & with his consent initiated. Dylan [name] received, stabilized & was discharged on the medications as listed below on his discharge medication lists. Dylan [name] was also enrolled & participated in the group counseling sessions being offered & held on this unit. Dylan [name] learned coping skills. Dylan [name] presented on this admission, other minor medical conditions that required treatment & monitoring. Dylan [name] was  treated & discharged on the medications for those health issues. Dylan [name] tolerated his treatment regimen without any adverse effects or reactions reported.  Dylan [name]'s symptoms responded well to his treatment regimen warranting this discharge. Dylan [name] is also mentally & medically stable & agreeable to this discharge. Dylan [name] mentioned during his admission assessment that one of his needs is finding another [place] to stay. Dylan [name] was able to get accepted at the [facility] as his new residence after discharge. Dylan [name] seems excited & relieved that Dylan [name] is able to find another suitable sober living environment for himself.    During the course of his hospitalization, the 15-minute checks were adequate to ensure his safety.  Patient did not display any dangerous, violent or suicidal behavior on the unit. Dylan [name] interacted with  patients & staff appropriately. Dylan [name] participated appropriately in the group sessions/therapies. His medications were addressed & adjusted to meet his needs. Dylan [name] was recommended for outpatient follow-up care & medication management upon discharge to assure his continuity of care.  At the time of discharge, patient is not reporting any acute suicidal/homicidal ideations. Dylan [name] currently denies any new issues or concerns. Education and supportive counseling provided throughout her hospital stay & upon discharge.  Today upon his discharge evaluation with his treatments team, Dylan [name] shares Dylan [name] is doing well. Dylan [name] denies any other specific concerns. Dylan [name] is sleeping well. His appetite is good. Dylan [name] denies other physical complaints. Dylan [name] denies AH/VH, delusional thoughts or paranoia. Dylan [name] does not appear to be responding to any internal stimuli. Dylan [name] feels that his medications have been helpful & is in agreement to continue his current treatment regimen as recommended. Dylan [name] was able to engage in safety planning including plan to return to [facility] or contact emergency services if Dylan [name] feels unable to maintain his own safety or the  safety of others. Pt had no further questions, comments, or concerns. Dylan [name] left [facility] with all personal belongings in no apparent distress. Transportation per the [facility].  Physical Findings: AIMS: Facial and Oral Movements Muscles of Facial Expression: None, normal Lips and Perioral Area: None, normal Jaw: None, normal Tongue: None, normal,Extremity Movements Upper (arms, wrists, hands, fingers): None, normal Lower (legs, knees, ankles, toes): None, normal, Trunk Movements Neck, shoulders, hips: None, normal, Overall Severity Severity of abnormal movements (highest score from questions above): None, normal Incapacitation due to abnormal movements: None, normal Patient's awareness of abnormal movements (rate only patient's report): No Awareness, Dental Status Current problems with teeth and/or dentures?: No Does patient usually wear dentures?: No   CIWA:    COWS:     Musculoskeletal: Strength & Muscle Tone: within normal limits Gait & Station:  Proofreader Patient leans: N/A   Psychiatric Specialty Exam:  Presentation  General Appearance: Disheveled  Eye Contact:Good  Speech:Clear and Coherent; Normal Rate  Speech Volume:Normal  Handedness:Right   Mood and Affect  Mood:Anxious; Depressed  Affect:Congruent   Thought Process  Thought Processes:Coherent; Goal Directed  Descriptions of Associations:Intact  Orientation:Full (Time, Place and Person)  Thought Content:Logical  History of Schizophrenia/Schizoaffective disorder:Yes  Duration of Psychotic Symptoms:Greater than six months  Hallucinations:No data recorded  Ideas of Reference:None  Suicidal Thoughts:No data recorded  Homicidal Thoughts:No data recorded  Sensorium  Memory:Recent Good; Remote Fair; Immediate Good  Judgment:Fair  Insight:Fair  Executive Functions  Concentration:Fair  Attention Span:Fair  [place]  Language:Good  Psychomotor Activity  Psychomotor Activity:No data recorded   Assets  Assets:Communication Skills; Desire for Improvement; Financial Resources/Insurance; Physical Health; Resilience   Sleep  Sleep:No data recorded   Physical Exam: Physical Exam Vitals and nursing note reviewed.  HENT:     Nose: Nose normal.     Mouth/Throat:     Pharynx: Oropharynx is clear.  Cardiovascular:     Rate and Rhythm: Normal rate.     Pulses: Normal pulses.  Pulmonary:     Effort: Pulmonary effort is normal.  Genitourinary:    Comments: Deferred Musculoskeletal:        General: Normal range of motion.     Cervical back: Normal range of motion.  Skin:    General: Skin is warm and dry.  Neurological:     General: No focal deficit present.     Mental Status: Dylan [name] is alert and oriented  to person, place, and time. Mental status is at baseline.    Review of Systems  Constitutional:  Negative for chills, diaphoresis and fever.  HENT:  Negative for congestion and sore throat.   Respiratory:  Negative for cough, shortness of breath and wheezing.   Cardiovascular:  Negative for chest pain and palpitations.  Gastrointestinal:  Negative for abdominal pain, blood in stool, constipation, diarrhea, heartburn, melena, nausea and vomiting.  Genitourinary:  Negative for dysuria.  Musculoskeletal:  Negative for joint pain and myalgias.  Endo/Heme/Allergies:        Allergies: PCN.  Food allergy: Port.   Other: Latex, tape.   Blood pressure (!) 146/88, pulse 68, temperature 97.6 F (36.4 C), temperature source Oral, resp. rate 18, height 6\' 2"  (1.88 m), weight 104.3 kg, SpO2 97 %. Body mass index is 29.53 kg/m.  Social History   Tobacco Use  Smoking Status Every Day   Packs/day: 0.50   Years: 25.00   Total pack years: 12.50   Types: E-cigarettes, Cigarettes  Smokeless Tobacco Never  Tobacco Comments   has patches when Dylan [name] is ready to quit   Tobacco Cessation:   An FDA-approved tobacco cessation medication recommended at discharge  Blood Alcohol level:  Lab Results  Component Value Date   [facility] <10 07/02/2021   ETH <10 02/72/5366   Metabolic Disorder Labs:  Lab Results  Component Value Date   HGBA1C 5.8 (H) 09/01/2021   MPG 119.76 09/01/2021   MPG 116.89 03/13/2021   Lab Results  Component Value Date   PROLACTIN 12.1 03/08/2020   PROLACTIN 2.6 (L) 11/09/2019   Lab Results  Component Value Date   CHOL 244 (H) 08/28/2021   TRIG 65 08/28/2021   HDL 51 08/28/2021   CHOLHDL 4.8 08/28/2021   VLDL 13 08/28/2021   LDLCALC 180 (H) 08/28/2021   LDLCALC 75 03/17/2021   See Psychiatric Specialty Exam and Suicide Risk Assessment completed by Attending Physician prior to discharge.  Discharge destination:  Other:  [facility]  ([facility]).  Is patient on multiple antipsychotic therapies at discharge:  No   Has Patient had three or more failed trials of antipsychotic monotherapy by history:  No  Recommended Plan for Multiple Antipsychotic Therapies: NA  Allergies as of 09/02/2021       Reactions   Penicillins Other (See Comments)   Childhood allergy   Pork-derived Products Other (See Comments)   Patient preference   Latex Rash   Tape Rash        Medication List     STOP taking these medications    mupirocin ointment 2 % Commonly known as: BACTROBAN   paliperidone 6 MG 24 hr tablet Commonly known as: INVEGA       TAKE these medications      Indication  Biktarvy 50-200-25 MG Tabs tablet Generic drug: bictegravir-emtricitabine-tenofovir AF Take 1 tablet by mouth daily.  Indication: HIV Disease   gabapentin 300 MG capsule Commonly known as: NEURONTIN Take 1 capsule (300 mg total) by mouth 3 (three) times daily. For pain What changed: additional instructions  Indication: Fibromyalgia Syndrome   hydrOXYzine 25 MG tablet Commonly known as: ATARAX Take 1 tablet (25 mg total) by mouth 3 (three) times  daily as needed for anxiety.  Indication: Feeling Anxious   nicotine polacrilex 2 MG gum Commonly known as: NICORETTE Take 1 each (2 mg total) by mouth as needed. (May buy from over the counter): For smoking cessation.  Indication: Nicotine Addiction  QUEtiapine Fumarate 150 MG Tabs Take 150 mg by mouth at bedtime. For mood control  Indication: Mood control   rosuvastatin 10 MG tablet Commonly known as: Crestor Take 1 tablet (10 mg total) by mouth daily. For high cholesterol What changed: additional instructions  Indication: Inherited Homozygous Hypercholesterolemia, High Amount of Fats in the Blood, High Amount of Triglycerides in the Blood   traZODone 150 MG tablet Commonly known as: DESYREL Take 1 tablet (150 mg total) by mouth at bedtime. For sleep What changed:  medication strength how much to take additional instructions  Indication: Trouble Sleeping   triamcinolone ointment 0.1 % Commonly known as: KENALOG Apply topically 2 (two) times daily. For fungal growth to right toes.  Indication: Fungal growth to to right toes.        Follow-up Information     Charlott Rakes, MD Follow up on 09/26/2021.   Specialty: Family Medicine Why: You have an appointment on 09/26/21 at 1:30 pm. Contact information: [place] [address] [phone]         [facility]. Go to.   Specialty: Behavioral Health Why: As your account is inactive, you will need to go to this provider for a re-assessment, to obtain therapy and medication management services on Monday or Wednesday, arrive by 7:30 am.  Services are provided on a first come, first served basis. Contact information: [address] [place] [phone]                Follow-up recommendations: Activity:  As tolerated Diet: As recommended by your primary care doctor. Keep all scheduled follow-up appointments as recommended.    Comment: Comments: Patient is recommended to follow-up care on an outpatient basis as noted above. Prescriptions sent to pt's pharmacy of choice at discharge.   Patient agreeable to plan.   Given opportunity to ask questions.   Appears to feel comfortable with discharge denies any current suicidal or homicidal thought. Patient is also instructed prior to discharge to: Take all medications as prescribed by his/her mental healthcare provider. Report any adverse effects and or reactions from the medicines to his/her outpatient provider promptly. Patient has been instructed & cautioned: To not engage in alcohol and or illegal drug use while on prescription medicines. In the event of worsening symptoms, patient is instructed to call the crisis hotline, 911 and or go to the nearest ED for appropriate evaluation and treatment of symptoms. To follow-up with his/her primary care provider for your other medical issues, concerns and or health care needs.    Signed: Lindell Spar, NP, pmhnp, fnp-bc. 09/02/2021, 11:49 AM

## 2021-09-02 NOTE — Progress Notes (Signed)
  [facility] Adult Case Management Discharge Plan :  Will you be returning to the same living situation after discharge:  No. [facility]  At discharge, do you have transportation home?: Yes,  [facility] employee Do you have the ability to pay for your medications: Yes,  Medicaid   Release of information consent forms completed and in the chart;  Patient's signature needed at discharge.  Patient to Follow up at:  Follow-up Information     Charlott Rakes, MD Follow up on 09/26/2021.   Specialty: Family Medicine Why: You have an appointment on 09/26/21 at 1:30 pm. Contact information: [place] [address] [phone]         [facility]. Go to.   Specialty: Behavioral Health Why: As your account is inactive, you will need to go to this provider for a re-assessment, to obtain therapy and medication management services on Monday or Wednesday, arrive by 7:30 am.  Services are provided on a first come, first served basis. Contact information: [place] [phone]                Next level of care provider has access to [place] and Suicide Prevention discussed: Yes,  with patient      Has patient been referred to the Quitline?: N/A patient is not a smoker  Patient has been referred for addiction treatment: Yes, [facility], patient was provided with [place] [facility] listings as well.   Darleen Crocker, LCSWA 09/02/2021, 11:57 AM

## 2021-09-04 ENCOUNTER — Encounter: Payer: Self-pay | Admitting: Internal Medicine

## 2021-09-04 ENCOUNTER — Encounter: Payer: Self-pay | Admitting: Podiatry

## 2021-09-05 NOTE — Telephone Encounter (Signed)
Can some one schedule him please?

## 2021-09-26 ENCOUNTER — Telehealth: Payer: Medicaid Other | Admitting: Family Medicine

## 2021-10-13 ENCOUNTER — Ambulatory Visit: Payer: Medicaid Other | Admitting: Podiatry

## 2021-10-28 ENCOUNTER — Other Ambulatory Visit: Payer: Self-pay

## 2021-10-28 ENCOUNTER — Encounter (HOSPITAL_COMMUNITY): Payer: Self-pay | Admitting: Emergency Medicine

## 2021-10-28 ENCOUNTER — Emergency Department (HOSPITAL_COMMUNITY): Payer: Medicaid Other

## 2021-10-28 ENCOUNTER — Emergency Department (HOSPITAL_COMMUNITY)
Admission: EM | Admit: 2021-10-28 | Discharge: 2021-10-28 | Discharge status: Against Medical Advice | Payer: Medicaid Other | Attending: Emergency Medicine | Admitting: Emergency Medicine

## 2021-10-28 DIAGNOSIS — R61 Generalized hyperhidrosis: Secondary | ICD-10-CM | POA: Diagnosis not present

## 2021-10-28 DIAGNOSIS — R0602 Shortness of breath: Secondary | ICD-10-CM | POA: Diagnosis not present

## 2021-10-28 DIAGNOSIS — R110 Nausea: Secondary | ICD-10-CM | POA: Diagnosis not present

## 2021-10-28 DIAGNOSIS — Z5321 Procedure and treatment not carried out due to patient leaving prior to being seen by health care provider: Secondary | ICD-10-CM | POA: Insufficient documentation

## 2021-10-28 DIAGNOSIS — R0789 Other chest pain: Secondary | ICD-10-CM | POA: Diagnosis present

## 2021-10-28 LAB — BASIC METABOLIC PANEL
Anion gap: 14 (ref 5–15)
BUN: 18 mg/dL (ref 6–20)
CO2: 23 mmol/L (ref 22–32)
Calcium: 10 mg/dL (ref 8.9–10.3)
Chloride: 101 mmol/L (ref 98–111)
Creatinine, Ser: 1.09 mg/dL (ref 0.61–1.24)
GFR, Estimated: >60 mL/min (ref >60)
Glucose, Bld: 104 mg/dL — ABNORMAL HIGH (ref 70–99)
Potassium: 3.7 mmol/L (ref 3.5–5.1)
Sodium: 138 mmol/L (ref 135–145)

## 2021-10-28 LAB — CBC
HCT: 43.3 % (ref 39.0–52.0)
Hemoglobin: 14.6 g/dL (ref 13.0–17.0)
MCH: 30.9 pg (ref 26.0–34.0)
MCHC: 33.7 g/dL (ref 30.0–36.0)
MCV: 91.7 fL (ref 80.0–100.0)
Platelets: 218 10*3/uL (ref 150–400)
RBC: 4.72 MIL/uL (ref 4.22–5.81)
RDW: 12.9 % (ref 11.5–15.5)
WBC: 9.7 10*3/uL (ref 4.0–10.5)
nRBC: 0 % (ref 0.0–0.2)

## 2021-10-28 LAB — TROPONIN I (HIGH SENSITIVITY): Troponin I (High Sensitivity): 8 ng/L (ref <18)

## 2021-10-28 NOTE — ED Notes (Signed)
Pt denies CP. But endorses weak legs. Pt states wants to leave, IV taken out. Pt left.

## 2021-10-28 NOTE — ED Triage Notes (Signed)
Per [facility] pt reports smoking crack today (first time in 6 months) while standing at bus stop had chest pain and diaphoresis. Given 324 aspirin 1 nitro with improvement. 18G LAC.

## 2021-10-28 NOTE — ED Provider Triage Note (Signed)
Emergency Medicine Provider Triage Evaluation Note  Dylan [name] , a 55 y.o. male  was evaluated in triage.  Pt complains of chest pain.  Patient reports that 45 minutes prior to chest pain onset he had smoked crack cocaine which he had done for the first time in 6 months.  The patient states that after he smoked crack cocaine, 45 minutes past the patient became all of a sudden diaphoretic and had sudden onset of left-sided chest pain that did not radiate.  Patient does endorse shortness of breath with his chest pain.  Patient states that this lasted for about 5 minutes, then dissipated.  Patient states pain is 8 out of 10 currently.  Patient denies any vomiting, fevers.  Patient does endorse one episode of nausea.  Review of Systems  Positive:  Negative:   Physical Exam  BP 113/70   Pulse 99   Temp 98 F (36.7 C) (Oral)   Resp 18   Ht 6\' 2"  (1.88 m)   Wt 102.1 kg   SpO2 97%   BMI 28.89 kg/m  Gen:   Awake, no distress   Resp:  Normal effort  MSK:   Moves extremities without difficulty  Other:    Medical Decision Making  Medically screening exam initiated at 5:39 PM.  Appropriate orders placed.  Dylan [name] was informed that the remainder of the evaluation will be completed by another provider, this initial triage assessment does not replace that evaluation, and the importance of remaining in the ED until their evaluation is complete.     Dylan Cecil, PA-C 10/28/21 1740

## 2021-10-28 NOTE — ED Notes (Signed)
Pt. Denies any chest pain, no sob noted.

## 2021-11-06 ENCOUNTER — Encounter: Payer: Self-pay | Admitting: Internal Medicine

## 2021-11-06 ENCOUNTER — Encounter: Payer: Self-pay | Admitting: Family Medicine

## 2021-11-08 ENCOUNTER — Ambulatory Visit: Payer: Self-pay

## 2021-11-08 ENCOUNTER — Ambulatory Visit (INDEPENDENT_AMBULATORY_CARE_PROVIDER_SITE_OTHER): Payer: Medicaid Other

## 2021-11-08 ENCOUNTER — Encounter: Payer: Self-pay | Admitting: Internal Medicine

## 2021-11-08 ENCOUNTER — Ambulatory Visit (INDEPENDENT_AMBULATORY_CARE_PROVIDER_SITE_OTHER): Payer: Medicaid Other | Admitting: Internal Medicine

## 2021-11-08 ENCOUNTER — Ambulatory Visit (INDEPENDENT_AMBULATORY_CARE_PROVIDER_SITE_OTHER): Payer: Medicaid Other | Admitting: Family

## 2021-11-08 ENCOUNTER — Other Ambulatory Visit: Payer: Self-pay

## 2021-11-08 VITALS — BP 124/83 | HR 59 | Temp 97.2°F | Ht 74.0 in | Wt 228.0 lb

## 2021-11-08 DIAGNOSIS — G629 Polyneuropathy, unspecified: Secondary | ICD-10-CM

## 2021-11-08 DIAGNOSIS — F411 Generalized anxiety disorder: Secondary | ICD-10-CM | POA: Diagnosis not present

## 2021-11-08 DIAGNOSIS — Z23 Encounter for immunization: Secondary | ICD-10-CM

## 2021-11-08 DIAGNOSIS — M79671 Pain in right foot: Secondary | ICD-10-CM

## 2021-11-08 DIAGNOSIS — B20 Human immunodeficiency virus [HIV] disease: Secondary | ICD-10-CM | POA: Diagnosis present

## 2021-11-08 DIAGNOSIS — M1712 Unilateral primary osteoarthritis, left knee: Secondary | ICD-10-CM | POA: Diagnosis not present

## 2021-11-08 MED ORDER — BIKTARVY 50-200-25 MG PO TABS
1.0000 | ORAL_TABLET | Freq: Every day | ORAL | 11 refills | Status: DC
Start: 2021-11-08 — End: 2022-11-22

## 2021-11-08 MED ORDER — ROSUVASTATIN CALCIUM 10 MG PO TABS: 10.0000 mg | ORAL_TABLET | Freq: Every day | ORAL | 3 refills | Status: DC

## 2021-11-08 MED ORDER — GABAPENTIN 300 MG PO CAPS: 600.0000 mg | ORAL_CAPSULE | Freq: Three times a day (TID) | ORAL | 0 refills | Status: DC

## 2021-11-08 NOTE — Progress Notes (Unsigned)
Office Visit Note   Patient: Dylan [name]           Date of Birth: 1965/04/19           MRN: [id number] Visit Date: 11/08/2021              Requested by: Charlott Rakes, MD [place],  [place] [address] PCP: Charlott Rakes, MD  No chief complaint on file.     HPI: The patient is a 56 year old gentleman who comes in today for 2 separate issues he does have history of left knee pain this has been ongoing for years he has previously been offered total knee arthroplasty he today is ready to schedule this.  Complaining of significant pain with weightbearing and his activities of daily living locking and catching.  Also having foot pain he did have Lisfranc fracture repair of the right foot several years ago he has had pain since he states he feels like the hardware is coming out.  He would like to discuss possible surgical intervention  Dr. Sharol Given has offered revision fusion in the past  Assessment & Plan: Visit Diagnoses:  1. Pain in right foot   2. Unilateral primary osteoarthritis, left knee     Plan: He would like to first begin with total knee arthroplasty on the left we will set this up at his convenience, ASAP.  He would like to address his right foot after his total knee.  Again discussed stiff walking issues for the right foot  Follow-Up Instructions: No follow-ups on file.   Left Knee Exam   Tenderness  The patient is experiencing tenderness in the medial joint line.  Range of Motion  The patient has normal left knee ROM.  Tests  Varus: negative Valgus: negative  Other  Erythema: absent Effusion: no effusion present      Patient is alert, oriented, no adenopathy, well-dressed, normal affect, normal respiratory effort. Right foot with no edema no erythema well-healed surgical incision there is no impending skin breakdown no open area.  Tender diffusely throughout the midfoot. pain with range of motion of the IP joint of the great  toe  Imaging: No results found. No images are attached to the encounter.  Labs: Lab Results  Component Value Date   HGBA1C 5.8 (H) 09/01/2021   HGBA1C 5.7 (H) 03/13/2021   HGBA1C 6.0 (H) 10/26/2020   ESRSEDRATE 2 07/14/2020   REPTSTATUS 03/03/2017 FINAL 02/26/2017   CULT  02/26/2017    NO GROWTH 5 DAYS Performed at [facility], [place] [address] [place], [place] [address]      Lab Results  Component Value Date   ALBUMIN 4.4 08/28/2021   ALBUMIN 3.1 (L) 07/05/2021   ALBUMIN 3.0 (L) 07/04/2021    Lab Results  Component Value Date   MG 2.8 (H) 02/20/2017   Lab Results  Component Value Date   VD25OH 14.4 (L) 01/17/2021    No results found for: "PREALBUMIN"    Latest Ref Rng & Units 10/28/2021    5:40 PM 08/28/2021    8:09 AM 07/02/2021    9:14 PM  CBC EXTENDED  WBC 4.0 - 10.5 K/uL 9.7  11.2  6.6   RBC 4.22 - 5.81 MIL/uL 4.72  4.65  4.65   Hemoglobin 13.0 - 17.0 g/dL 14.6  14.3  14.3   HCT 39.0 - 52.0 % 43.3  42.6  42.4   Platelets 150 - 400 K/uL 218  219  197  NEUT# 1.7 - 7.7 K/uL  7.8    Lymph# 0.7 - 4.0 K/uL  2.4       There is no height or weight on file to calculate BMI.  Orders:  Orders Placed This Encounter  Procedures   XR Foot Complete Right   XR Knee 1-2 Views Left   No orders of the defined types were placed in this encounter.    Procedures: No procedures performed  Clinical Data: No additional findings.  ROS:  All other systems negative, except as noted in the HPI. Review of Systems  Objective: Vital Signs: There were no vitals taken for this visit.  Specialty Comments:  No specialty comments available.  PMFS History: Patient Active Problem List   Diagnosis Date Noted   Transaminitis 07/03/2021   Right foot ulcer ([place]) 07/03/2021   Onychomycosis 04/19/2021   Cocaine abuse ([place]) 03/16/2021   Bipolar I disorder, most recent episode depressed ([place]) 03/15/2021   Bipolar 1 disorder, depressed ([place]) 03/15/2021   Nodular  radiologic density 02/02/2021   Lesion of liver 02/02/2021   Liver disease 02/02/2021   Vitamin D deficiency 01/18/2021   Alcohol abuse 01/17/2021   Methamphetamine abuse ([place]) 01/17/2021   Lesion of pancreas 01/17/2021   Substance induced mood disorder ([place]) 10/26/2020   Fatty liver 03/09/2020   Schizophrenia ([place]) 03/08/2020   Generalized anxiety disorder 02/03/2020   Insomnia due to other mental disorder 02/03/2020   Lisfranc dislocation, right, initial encounter    Prediabetes 07/22/2019   Tobacco abuse 07/10/2018   Schizoaffective disorder, bipolar type ([place])    Acute renal failure (ARF) (HCC)    Suicidal ideations    Chronic 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 coma ([place])    AKI (acute kidney injury) ([place]) 02/19/2017   Rhabdomyolysis 02/19/2017   Suicidal overdose ([place]) 02/19/2017   Acute hepatitis 02/19/2017   Elevated LFTs    Schizoaffective disorder, depressive type ([place]) 09/05/2011   Degenerative disc disease 08/21/2011   HIV disease ([place]) 08/17/2011   Past Medical History:  Diagnosis Date   Anxiety    Arthritis    Bipolar 1 disorder ([place])    Colon polyps    Depression    GERD (gastroesophageal reflux disease)    Hepatitis B    Human immunodeficiency virus (HIV) ([place])    Hyperlipidemia    Hypertension    Neuromuscular disorder ([place])    neuropathy   Neuropathy    Pre-diabetes    Schizophrenia ([place])     Family History  Problem Relation Age of Onset   CAD Mother    Diabetes Father    Colon cancer Maternal Aunt 60   Diabetes Maternal Aunt    Heart disease Maternal Uncle    Esophageal cancer Neg Hx    Rectal cancer Neg Hx    Stomach cancer Neg Hx     Past Surgical History:  Procedure Laterality Date   FOOT ARTHRODESIS Right 09/17/2019   Procedure: FUSION RIGHT LISFRANC JOINT;  Surgeon: Newt Minion, MD;  Location: [facility];  Service: Orthopedics;  Laterality: Right;   FOOT ARTHRODESIS Right 09/2019   HEMORROIDECTOMY     IR FLUORO GUIDE CV  LINE RIGHT  02/22/2017   IR US GUIDE VASC ACCESS RIGHT  02/22/2017   TUMOR REMOVAL     From Chest   Social History   Occupational History   Not on file  Tobacco Use   Smoking status: Every Day    Packs/day: 0.50    Years:  25.00    Total pack years: 12.50    Types: E-cigarettes, Cigarettes   Smokeless tobacco: Never   Tobacco comments:    Vapes daily  Vaping Use   Vaping Use: Every day   Start date: 12/09/2020   Substances: Nicotine  Substance and Sexual Activity   Alcohol use: Yes    Comment: occassional drinker   Drug use: Yes    Types: Cocaine    Comment: last use was Friday 03/05/2020   Sexual activity: Yes    Partners: Female

## 2021-11-08 NOTE — Progress Notes (Signed)
RFV: follow up from having COVID-19  Patient ID: Dylan [name], male   DOB: 1965/03/05, 56 y.o.   MRN: [id number]  HPI Dylan [name] is a 56yo M with HIV disease on biktarvy. 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 Dylan [name] is fully immunized, with booster. However, 2wks ago, Dylan [name] had moderate case of covid-19. Did not require hospitalization but still had chills, cough, fatigue that has has persisted for several weeks.  In the last 30 days, Dylan [name] has missed 3 doses of biktarvy due to being so busy  ROS: Neuropathy of feet worsening  Sochx: Contemplating withdrawal from school since missing so many culinary labs, difficulty to keep track.  Outpatient Encounter Medications as of 11/08/2021  Medication Sig   bictegravir-emtricitabine-tenofovir AF (BIKTARVY) 50-200-25 MG TABS tablet Take 1 tablet by mouth daily.   gabapentin (NEURONTIN) 300 MG capsule Take 1 capsule (300 mg total) by mouth 3 (three) times daily. For pain   hydrOXYzine (ATARAX) 25 MG tablet Take 1 tablet (25 mg total) by mouth 3 (three) times daily as needed for anxiety.   QUEtiapine Fumarate 150 MG TABS Take 150 mg by mouth at bedtime. For mood control   rosuvastatin (CRESTOR) 10 MG tablet Take 1 tablet (10 mg total) by mouth daily. For high cholesterol   traZODone (DESYREL) 150 MG tablet Take 1 tablet (150 mg total) by mouth at bedtime. For sleep   nicotine polacrilex (NICORETTE) 2 MG gum Take 1 each (2 mg total) by mouth as needed. (May buy from over the counter): For smoking cessation. (Patient not taking: Reported on 11/08/2021)   triamcinolone ointment (KENALOG) 0.1 % Apply topically 2 (two) times daily. For fungal growth to right toes. (Patient not taking: Reported on 11/08/2021)   No facility-administered encounter medications on file as of 11/08/2021.     Patient Active Problem List   Diagnosis Date Noted   Transaminitis 07/03/2021    Right foot ulcer ([place]) 07/03/2021   Onychomycosis 04/19/2021   Cocaine abuse ([place]) 03/16/2021   Bipolar I disorder, most recent episode depressed ([place]) 03/15/2021   Bipolar 1 disorder, depressed ([place]) 03/15/2021   Nodular radiologic density 02/02/2021   Lesion of liver 02/02/2021   Liver disease 02/02/2021   Vitamin D deficiency 01/18/2021   Alcohol abuse 01/17/2021   Methamphetamine abuse ([place]) 01/17/2021   Lesion of pancreas 01/17/2021   Substance induced mood disorder ([place]) 10/26/2020   Fatty liver 03/09/2020   Schizophrenia ([place]) 03/08/2020   Generalized anxiety disorder 02/03/2020   Insomnia due to other mental disorder 02/03/2020   Lisfranc dislocation, right, initial encounter    Prediabetes 07/22/2019   Tobacco abuse 07/10/2018   Schizoaffective disorder, bipolar type ([place])    Acute renal failure (ARF) ([place])    Suicidal ideations    Chronic 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 coma ([place])    AKI (acute kidney injury) ([place]) 02/19/2017   Rhabdomyolysis 02/19/2017   Suicidal overdose ([place]) 02/19/2017   Acute hepatitis 02/19/2017   Elevated LFTs    Schizoaffective disorder, depressive type ([place]) 09/05/2011   Degenerative disc disease 08/21/2011   HIV disease ([place]) 08/17/2011     Health Maintenance Due  Topic Date Due   Zoster Vaccines- Shingrix (1 of 2) Never done   COVID-19 Vaccine (5 - Pfizer risk series) 03/03/2021   INFLUENZA VACCINE  08/09/2021    Physical Exam   BP 124/83   Pulse (!) 59  Temp (!) 97.2 F (36.2 C) (Temporal)   Ht 6\' 2"  (1.88 m)   Wt 228 lb (103.4 kg)   SpO2 99%   BMI 29.27 kg/m   Physical Exam  Constitutional: Dylan [name] is oriented to person, place, and time. Dylan [name] appears well-developed and well-nourished. No distress.  HENT:  Mouth/Throat: Oropharynx is clear and moist. No oropharyngeal exudate.  Cardiovascular: Normal rate, regular rhythm and normal heart sounds. Exam reveals no gallop and no friction rub.  No murmur heard.   Pulmonary/Chest: Effort normal and breath sounds normal. No respiratory distress. Dylan [name] has no wheezes.  Abdominal: Soft. Bowel sounds are normal. Dylan [name] exhibits no distension. There is no tenderness.  Lymphadenopathy:  Dylan [name] has no cervical adenopathy.  Neurological: Dylan [name] is alert and oriented to person, place, and time.  Skin: Skin is warm and dry. No rash noted. No erythema.  Psychiatric: Dylan [name] has a normal mood and affect. His behavior is normal.   Lab Results  Component Value Date   CD4TCELL 36 01/06/2021   Lab Results  Component Value Date   CD4TABS 910 01/06/2021   CD4TABS 648 07/06/2020   CD4TABS 797 03/24/2020   Lab Results  Component Value Date   HIV1RNAQUANT <20 (H) 01/06/2021   Lab Results  Component Value Date   HEPBSAB NON-REACTIVE 07/24/2017   Lab Results  Component Value Date   LABRPR NON REACTIVE 03/17/2021    CBC Lab Results  Component Value Date   WBC 9.7 10/28/2021   RBC 4.72 10/28/2021   HGB 14.6 10/28/2021   HCT 43.3 10/28/2021   PLT 218 10/28/2021   MCV 91.7 10/28/2021   MCH 30.9 10/28/2021   MCHC 33.7 10/28/2021   RDW 12.9 10/28/2021   LYMPHSABS 2.4 08/28/2021   MONOABS 0.9 08/28/2021   EOSABS 0.0 08/28/2021    BMET Lab Results  Component Value Date   NA 138 10/28/2021   K 3.7 10/28/2021   CL 101 10/28/2021   CO2 23 10/28/2021   GLUCOSE 104 (H) 10/28/2021   BUN 18 10/28/2021   CREATININE 1.09 10/28/2021   CALCIUM 10.0 10/28/2021   GFRNONAA >60 10/28/2021   GFRAA 92 07/06/2020      Assessment and Plan COVID-19 recovering = will provide a Note for school  HIV Disease= Check HIV labs but concern that it will be dedictable since Dylan [name] reports missing many doses throughout the year. Cabenuva not possible due to RPV resistance   Peripheral neuropathy= Increase neurontin dose  Health maintenance= Flu shot today; defer covid-19 new booster   Anxiety = trying to continue with balance work and life stress. Reiterated importance to take meds  daily

## 2021-11-09 ENCOUNTER — Encounter: Payer: Self-pay | Admitting: Family

## 2021-11-09 LAB — T-HELPER CELL (CD4) - (RCID CLINIC ONLY)
CD4 % Helper T Cell: 37 % (ref 33–65)
CD4 T Cell Abs: 638 /uL (ref 400–1790)

## 2021-11-10 LAB — COMPLETE METABOLIC PANEL WITH GFR
AG Ratio: 1.6 (calc) (ref 1.0–2.5)
ALT: 48 U/L — ABNORMAL HIGH (ref 9–46)
AST: 28 U/L (ref 10–35)
Albumin: 4.4 g/dL (ref 3.6–5.1)
Alkaline phosphatase (APISO): 82 U/L (ref 35–144)
BUN: 19 mg/dL (ref 7–25)
CO2: 28 mmol/L (ref 20–32)
Calcium: 9.3 mg/dL (ref 8.6–10.3)
Chloride: 106 mmol/L (ref 98–110)
Creat: 1.08 mg/dL (ref 0.70–1.30)
Globulin: 2.8 g/dL (calc) (ref 1.9–3.7)
Glucose, Bld: 96 mg/dL (ref 65–99)
Potassium: 4.4 mmol/L (ref 3.5–5.3)
Sodium: 139 mmol/L (ref 135–146)
Total Bilirubin: 0.3 mg/dL (ref 0.2–1.2)
Total Protein: 7.2 g/dL (ref 6.1–8.1)
eGFR: 81 mL/min/{1.73_m2} (ref >=60)

## 2021-11-10 LAB — CBC WITH DIFFERENTIAL/PLATELET
Absolute Monocytes: 456 cells/uL (ref 200–950)
Basophils Absolute: 19 cells/uL (ref 0–200)
Basophils Relative: 0.4 %
Eosinophils Absolute: 160 cells/uL (ref 15–500)
Eosinophils Relative: 3.4 %
HCT: 41 % (ref 38.5–50.0)
Hemoglobin: 14 g/dL (ref 13.2–17.1)
Lymphs Abs: 1880 cells/uL (ref 850–3900)
MCH: 30.9 pg (ref 27.0–33.0)
MCHC: 34.1 g/dL (ref 32.0–36.0)
MCV: 90.5 fL (ref 80.0–100.0)
MPV: 11 fL (ref 7.5–12.5)
Monocytes Relative: 9.7 %
Neutro Abs: 2186 cells/uL (ref 1500–7800)
Neutrophils Relative %: 46.5 %
Platelets: 234 10*3/uL (ref 140–400)
RBC: 4.53 10*6/uL (ref 4.20–5.80)
RDW: 12.3 % (ref 11.0–15.0)
Total Lymphocyte: 40 %
WBC: 4.7 10*3/uL (ref 3.8–10.8)

## 2021-11-10 LAB — HIV-1 RNA QUANT-NO REFLEX-BLD
HIV 1 RNA Quant: 21 Copies/mL — ABNORMAL HIGH
HIV-1 RNA Quant, Log: 1.32 Log cps/mL — ABNORMAL HIGH

## 2021-11-10 LAB — RPR: RPR Ser Ql: NONREACTIVE

## 2021-11-16 ENCOUNTER — Encounter: Payer: Self-pay | Admitting: Nurse Practitioner

## 2021-11-16 ENCOUNTER — Ambulatory Visit (INDEPENDENT_AMBULATORY_CARE_PROVIDER_SITE_OTHER): Payer: Medicaid Other | Admitting: Nurse Practitioner

## 2021-11-16 VITALS — BP 117/75 | HR 63 | Temp 98.0°F | Wt 227.4 lb

## 2021-11-16 DIAGNOSIS — R5382 Chronic fatigue, unspecified: Secondary | ICD-10-CM

## 2021-11-16 DIAGNOSIS — R9431 Abnormal electrocardiogram [ECG] [EKG]: Secondary | ICD-10-CM

## 2021-11-16 DIAGNOSIS — E663 Overweight: Secondary | ICD-10-CM | POA: Diagnosis not present

## 2021-11-16 DIAGNOSIS — F1410 Cocaine abuse, uncomplicated: Secondary | ICD-10-CM

## 2021-11-16 DIAGNOSIS — E559 Vitamin D deficiency, unspecified: Secondary | ICD-10-CM | POA: Diagnosis not present

## 2021-11-16 NOTE — Assessment & Plan Note (Signed)
Noted on recent EKG Shows possible left Atrial  enlargement, prolonged QT Due to his complaints of shortness of breath on exertion and family history of heart disease will refer patient to cardiology for further evaluation.  Currently denies chest pain, syncope, edema.

## 2021-11-16 NOTE — Assessment & Plan Note (Addendum)
Fatigue Since he had COVID 2 months ago. Recent chest xray showed No acute cardiopulmonary process Recent CBC and CMP was normal. Has history of vitamin D deficiency we will check vitamin D levels. Patient encouraged to continue to exercise regularly and stay active.  Lab Results  Component Value Date   WBC 4.7 11/08/2021   HGB 14.0 11/08/2021   HCT 41.0 11/08/2021   MCV 90.5 11/08/2021   PLT 234 11/08/2021    Lab Results  Component Value Date   NA 139 11/08/2021   K 4.4 11/08/2021   CO2 28 11/08/2021   GLUCOSE 96 11/08/2021   BUN 19 11/08/2021   CREATININE 1.08 11/08/2021   CALCIUM 9.3 11/08/2021   EGFR 81 11/08/2021   GFRNONAA >60 10/28/2021    Lab Results  Component Value Date   WBC 4.7 11/08/2021   HGB 14.0 11/08/2021   HCT 41.0 11/08/2021   MCV 90.5 11/08/2021   PLT 234 11/08/2021

## 2021-11-16 NOTE — Assessment & Plan Note (Addendum)
Wt Readings from Last 3 Encounters:  11/16/21 227 lb 6.4 oz (103.1 kg)  11/08/21 228 lb (103.4 kg)  10/28/21 225 lb (102.1 kg)  Patient counseled on low-carb modified diet He was encouraged to continue to stay active and exercise regularly.  States that he does a lot of weight lifting and stays active.

## 2021-11-16 NOTE — Progress Notes (Signed)
 Established Patient Office Visit  Subjective:  Patient ID: Dylan [name], male    DOB: 02/01/1965  Age: 56 y.o. MRN: [id number]  CC:  Chief Complaint  Patient presents with   covid  follow up    Also has concerns about his heart    HPI Dylan [name] is a 56 y.o. male with past medical history of acute hepatitis, vitamin D deficiency, schizophrenia, cocaine abuse, HIV who presents for complaints of fatigue since he tested positive for COVID 2 months ago , stated that he was evaluated at an urgent care in [place].  He currently denies chest pain, cough, fever, chills, sore throat.  States that he is up-to-date with his COVID vaccines. Patient of DR Newlin  Patient also complains of shortness of breath when climbing the stairs or bending down to pick up things, this has been going on for many years, he denies chest pain, edema, syncope.  Has history of cocaine abuse states that his last cocaine use was about 2 months ago.  Has family history of heart disease, is worried about recent EKG done at the ED  that showed left atrial enlargement.   Due for shingles vaccine need for shingles vaccine discussed patient encouraged to get the vaccine at his pharmacy.   .   Past Medical History:  Diagnosis Date   Anxiety    Arthritis    Bipolar 1 disorder (HCC)    Colon polyps    Depression    GERD (gastroesophageal reflux disease)    Hepatitis B    Human immunodeficiency virus (HIV) (HCC)    Hyperlipidemia    Hypertension    Neuromuscular disorder (HCC)    neuropathy   Neuropathy    Pre-diabetes    Schizophrenia (HCC)     Past Surgical History:  Procedure Laterality Date   FOOT ARTHRODESIS Right 09/17/2019   Procedure: FUSION RIGHT LISFRANC JOINT;  Surgeon: Duda, Marcus V, MD;  Location: [facility];  Service: Orthopedics;  Laterality: Right;   FOOT ARTHRODESIS Right 09/2019   HEMORROIDECTOMY     IR FLUORO GUIDE CV LINE RIGHT  02/22/2017   IR US GUIDE VASC ACCESS RIGHT  02/22/2017   TUMOR  REMOVAL     From Chest    Family History  Problem Relation Age of Onset   CAD Mother    Diabetes Father    Stroke Father    Colon cancer Maternal Aunt 60   Diabetes Maternal Aunt    Heart disease Maternal Uncle    Heart attack Maternal Uncle    Esophageal cancer Neg Hx    Rectal cancer Neg Hx    Stomach cancer Neg Hx     Social History   Socioeconomic History   Marital status: Married    Spouse name: Not on file   Number of children: 1   Years of education: Not on file   Highest education level: Not on file  Occupational History   Not on file  Tobacco Use   Smoking status: Every Day    Packs/day: 0.50    Years: 25.00    Total pack years: 12.50    Types: E-cigarettes, Cigarettes   Smokeless tobacco: Never   Tobacco comments:    Vapes daily  Vaping Use   Vaping Use: Every day   Start date: 12/09/2020   Substances: Nicotine  Substance and Sexual Activity   Alcohol use: Yes    Comment: occassional drinker   Drug use: Yes    Types: Cocaine      Comment: last use was Friday 03/05/2020   Sexual activity: Yes    Partners: Female  Other Topics Concern   Not on file  Social History Narrative   Lives home alone.    Social Determinants of Health   Financial Resource Strain: Not on file  Food Insecurity: Not on file  Transportation Needs: Not on file  Physical Activity: Not on file  Stress: Not on file  Social Connections: Not on file  Intimate Partner Violence: Not on file    Outpatient Medications Prior to Visit  Medication Sig Dispense Refill   bictegravir-emtricitabine-tenofovir AF (BIKTARVY) 50-200-25 MG TABS tablet Take 1 tablet by mouth daily. 30 tablet 11   gabapentin (NEURONTIN) 300 MG capsule Take 2 capsules (600 mg total) by mouth 3 (three) times daily. For pain 180 capsule 0   hydrOXYzine (ATARAX) 25 MG tablet Take 1 tablet (25 mg total) by mouth 3 (three) times daily as needed for anxiety. 75 tablet 0   QUEtiapine Fumarate 150 MG TABS Take 150 mg by  mouth at bedtime. For mood control 30 tablet 0   rosuvastatin (CRESTOR) 10 MG tablet Take 1 tablet (10 mg total) by mouth daily. For high cholesterol 90 tablet 3   traZODone (DESYREL) 150 MG tablet Take 1 tablet (150 mg total) by mouth at bedtime. For sleep 30 tablet 0   triamcinolone ointment (KENALOG) 0.1 % Apply topically 2 (two) times daily. For fungal growth to right toes. 30 g 0   No facility-administered medications prior to visit.    Allergies  Allergen Reactions   Penicillins Other (See Comments)    Childhood allergy   Pork-Derived Products Other (See Comments)    Patient preference   Latex Rash   Tape Rash    ROS Review of Systems  Constitutional:  Positive for fatigue. Negative for activity change, chills, diaphoresis, fever and unexpected weight change.  Respiratory:  Positive for shortness of breath. Negative for apnea, cough, choking, wheezing and stridor.   Cardiovascular: Negative.  Negative for chest pain, palpitations and leg swelling.  Gastrointestinal:  Negative for abdominal distention and abdominal pain.  Musculoskeletal: Negative.   Neurological:  Negative for dizziness, facial asymmetry and headaches.  Psychiatric/Behavioral:  Negative for agitation, behavioral problems, confusion and decreased concentration.       Objective:    Physical Exam Constitutional:      General: He is not in acute distress.    Appearance: Normal appearance. He is not ill-appearing, toxic-appearing or diaphoretic.  Eyes:     General: No scleral icterus.       Right eye: No discharge.        Left eye: No discharge.     Extraocular Movements: Extraocular movements intact.     Conjunctiva/sclera: Conjunctivae normal.     Pupils: Pupils are equal, round, and reactive to light.  Cardiovascular:     Rate and Rhythm: Normal rate and regular rhythm.     Pulses: Normal pulses.     Heart sounds: Normal heart sounds. No murmur heard.    No friction rub. No gallop.  Pulmonary:      Effort: Pulmonary effort is normal. No respiratory distress.     Breath sounds: Normal breath sounds. No stridor. No wheezing, rhonchi or rales.  Chest:     Chest wall: No tenderness.  Abdominal:     General: There is no distension.     Palpations: Abdomen is soft.     Tenderness: There is no abdominal tenderness.  Musculoskeletal:          General: No swelling.     Right lower leg: No edema.     Left lower leg: No edema.  Skin:    General: Skin is warm and dry.     Capillary Refill: Capillary refill takes less than 2 seconds.  Neurological:     Mental Status: He is alert and oriented to person, place, and time.     Cranial Nerves: No cranial nerve deficit.     Motor: No weakness.     Gait: Gait normal.  Psychiatric:        Mood and Affect: Mood normal.        Behavior: Behavior normal.        Thought Content: Thought content normal.        Judgment: Judgment normal.     BP 117/75   Pulse 63   Temp 98 F (36.7 C)   Wt 227 lb 6.4 oz (103.1 kg)   SpO2 99%   BMI 29.20 kg/m  Wt Readings from Last 3 Encounters:  11/16/21 227 lb 6.4 oz (103.1 kg)  11/08/21 228 lb (103.4 kg)  10/28/21 225 lb (102.1 kg)    Lab Results  Component Value Date   TSH 2.828 08/31/2021   Lab Results  Component Value Date   WBC 4.7 11/08/2021   HGB 14.0 11/08/2021   HCT 41.0 11/08/2021   MCV 90.5 11/08/2021   PLT 234 11/08/2021   Lab Results  Component Value Date   NA 139 11/08/2021   K 4.4 11/08/2021   CO2 28 11/08/2021   GLUCOSE 96 11/08/2021   BUN 19 11/08/2021   CREATININE 1.08 11/08/2021   BILITOT 0.3 11/08/2021   ALKPHOS 83 08/28/2021   AST 28 11/08/2021   ALT 48 (H) 11/08/2021   PROT 7.2 11/08/2021   ALBUMIN 4.4 08/28/2021   CALCIUM 9.3 11/08/2021   ANIONGAP 14 10/28/2021   EGFR 81 11/08/2021   Lab Results  Component Value Date   CHOL 244 (H) 08/28/2021   Lab Results  Component Value Date   HDL 51 08/28/2021   Lab Results  Component Value Date   LDLCALC 180 (H)  08/28/2021   Lab Results  Component Value Date   TRIG 65 08/28/2021   Lab Results  Component Value Date   CHOLHDL 4.8 08/28/2021   Lab Results  Component Value Date   HGBA1C 5.8 (H) 09/01/2021      Assessment & Plan:   Problem List Items Addressed This Visit       Other   Cocaine abuse (HCC) (Chronic)    States that his last cocaine use was 2 months ago. Need to avoid using cocaine due to risk of sudden cardiac arrest, stroke discussed with patient he verbalized understanding      Vitamin D deficiency   Relevant Orders   Vitamin D, 25-hydroxy   Chronic fatigue - Primary    Fatigue Since he had COVID 2 months ago. Recent chest xray showed No acute cardiopulmonary process Recent CBC and CMP was normal. Has history of vitamin D deficiency we will check vitamin D levels. Patient encouraged to continue to exercise regularly and stay active.  Lab Results  Component Value Date   WBC 4.7 11/08/2021   HGB 14.0 11/08/2021   HCT 41.0 11/08/2021   MCV 90.5 11/08/2021   PLT 234 11/08/2021    Lab Results  Component Value Date   NA 139 11/08/2021   K 4.4 11/08/2021   CO2 28 11/08/2021   GLUCOSE 96 11/08/2021     BUN 19 11/08/2021   CREATININE 1.08 11/08/2021   CALCIUM 9.3 11/08/2021   EGFR 81 11/08/2021   GFRNONAA >60 10/28/2021    Lab Results  Component Value Date   WBC 4.7 11/08/2021   HGB 14.0 11/08/2021   HCT 41.0 11/08/2021   MCV 90.5 11/08/2021   PLT 234 11/08/2021         Relevant Orders   Vitamin D, 25-hydroxy   Abnormal EKG    Noted on recent EKG Shows possible left Atrial  enlargement, prolonged QT Due to his complaints of shortness of breath on exertion and family history of heart disease will refer patient to cardiology for further evaluation.  Currently denies chest pain, syncope, edema.        Relevant Orders   Ambulatory referral to Cardiology   Overweight (BMI 25.0-29.9)    Wt Readings from Last 3 Encounters:  11/16/21 227 lb 6.4 oz  (103.1 kg)  11/08/21 228 lb (103.4 kg)  10/28/21 225 lb (102.1 kg)  Patient counseled on low-carb modified diet He was encouraged to continue to stay active and exercise regularly.  States that he does a lot of weight lifting and stays active.       No orders of the defined types were placed in this encounter.   Follow-up: No follow-ups on file.     R , FNP 

## 2021-11-16 NOTE — Patient Instructions (Addendum)
Please get your shingles vaccine at the pharmacy. Please follow up with your PCP in 3 months   It is important that you exercise regularly at least 30 minutes 5 times a week as tolerated  Think about what you will eat, plan ahead. Choose " clean, green, fresh or frozen" over canned, processed or packaged foods which are more sugary, salty and fatty. 70 to 75% of food eaten should be vegetables and fruit. Three meals at set times with snacks allowed between meals, but they must be fruit or vegetables. Aim to eat over a 12 hour period , example 7 am to 7 pm, and STOP after  your last meal of the day. Drink water,generally about 64 ounces per day, no other drink is as healthy. Fruit juice is best enjoyed in a healthy way, by EATING the fruit.  Thanks for choosing [facility] we consider it a privelige to serve you.

## 2021-11-16 NOTE — Assessment & Plan Note (Signed)
States that his last cocaine use was 2 months ago. Need to avoid using cocaine due to risk of sudden cardiac arrest, stroke discussed with patient he verbalized understanding

## 2021-11-17 ENCOUNTER — Other Ambulatory Visit: Payer: Self-pay

## 2021-11-17 DIAGNOSIS — E559 Vitamin D deficiency, unspecified: Secondary | ICD-10-CM

## 2021-11-21 ENCOUNTER — Encounter: Payer: Self-pay | Admitting: Orthopedic Surgery

## 2021-11-21 NOTE — Progress Notes (Unsigned)
Cardiology Office Note:    Date:  11/21/2021   ID:  Dylan [name], DOB November 20, 1965, MRN [id number]  PCP:  Charlott Rakes, MD   [facility] Providers Cardiologist:  None { Click to update primary MD,subspecialty MD or APP then REFRESH:1}    Referring MD: Charlott Rakes, MD   No chief complaint on file. ***  History of Present Illness:    Dylan [name] is a 56 y.o. male with a hx of bipolar disorder, GERD, HIV, schizophrenia, referral for LAE and SOB.    Smoking ***  No prior cardiac dx hx  ECG-10/23. NSR, LAE, LVH, Qtc 495 ms  Past Medical History:  Diagnosis Date   Anxiety    Arthritis    Bipolar 1 disorder ([place])    Colon polyps    Depression    GERD (gastroesophageal reflux disease)    Hepatitis B    Human immunodeficiency virus (HIV) ([place])    Hyperlipidemia    Hypertension    Neuromuscular disorder ([place])    neuropathy   Neuropathy    Pre-diabetes    Schizophrenia ([place])     Past Surgical History:  Procedure Laterality Date   FOOT ARTHRODESIS Right 09/17/2019   Procedure: FUSION RIGHT LISFRANC JOINT;  Surgeon: Newt Minion, MD;  Location: [facility];  Service: Orthopedics;  Laterality: Right;   FOOT ARTHRODESIS Right 09/2019   HEMORROIDECTOMY     IR FLUORO GUIDE CV LINE RIGHT  02/22/2017   IR US GUIDE VASC ACCESS RIGHT  02/22/2017   TUMOR REMOVAL     From Chest    Current Medications: No outpatient medications have been marked as taking for the 11/22/21 encounter (Appointment) with Janina Mayo, MD.     Allergies:   Penicillins, Pork-derived products, Latex, and Tape   Social History   Socioeconomic History   Marital status: Married    Spouse name: Not on file   Number of children: 1   Years of education: Not on file   Highest education level: Not on file  Occupational History   Not on file  Tobacco Use   Smoking status: Every Day    Packs/day: 0.50    Years: 25.00    Total pack years: 12.50    Types: E-cigarettes, Cigarettes    Smokeless tobacco: Never   Tobacco comments:    Vapes daily  Vaping Use   Vaping Use: Every day   Start date: 12/09/2020   Substances: Nicotine  Substance and Sexual Activity   Alcohol use: Yes    Comment: occassional drinker   Drug use: Yes    Types: Cocaine    Comment: last use was Friday 03/05/2020   Sexual activity: Yes    Partners: Female  Other Topics Concern   Not on file  Social History Narrative   Lives home alone.    Social Determinants of Health   Financial Resource Strain: Not on file  Food Insecurity: Not on file  Transportation Needs: Not on file  Physical Activity: Not on file  Stress: Not on file  Social Connections: Not on file     Family History: The patient's ***family history includes CAD in his mother; Colon cancer (age of onset: 44) in his maternal aunt; Diabetes in his father and maternal aunt; Heart attack in his maternal uncle; Heart disease in his maternal uncle; Stroke in his father. There is no history of Esophageal cancer, Rectal cancer, or Stomach cancer.  ROS:   Please see the history of present illness.    ***  All other systems reviewed and are negative.  EKGs/Labs/Other Studies Reviewed:    The following studies were reviewed today: ***  EKG:  EKG is *** ordered today.  The ekg ordered today demonstrates ***  Recent Labs: 08/31/2021: TSH 2.828 11/08/2021: ALT 48; BUN 19; Creat 1.08; Hemoglobin 14.0; Platelets 234; Potassium 4.4; Sodium 139  Recent Lipid Panel    Component Value Date/Time   CHOL 244 (H) 08/28/2021 0809   CHOL 158 02/13/2019 1128   TRIG 65 08/28/2021 0809   HDL 51 08/28/2021 0809   HDL 41 02/13/2019 1128   CHOLHDL 4.8 08/28/2021 0809   VLDL 13 08/28/2021 0809   LDLCALC 180 (H) 08/28/2021 0809   LDLCALC 97 02/13/2019 1128   LDLCALC 149 (H) 06/06/2018 1406     Risk Assessment/Calculations:   {Does this patient have ATRIAL FIBRILLATION?:[phone]}  No BP recorded.  {Refresh Note OR Click here to enter BP   :1}***         Physical Exam:    VS:  There were no vitals taken for this visit.    Wt Readings from Last 3 Encounters:  11/16/21 227 lb 6.4 oz (103.1 kg)  11/08/21 228 lb (103.4 kg)  10/28/21 225 lb (102.1 kg)     GEN: *** Well nourished, well developed in no acute distress HEENT: Normal NECK: No JVD; No carotid bruits LYMPHATICS: No lymphadenopathy CARDIAC: ***RRR, no murmurs, rubs, gallops RESPIRATORY:  Clear to auscultation without rales, wheezing or rhonchi  ABDOMEN: Soft, non-tender, non-distended MUSCULOSKELETAL:  No edema; No deformity  SKIN: Warm and dry NEUROLOGIC:  Alert and oriented x 3 PSYCHIATRIC:  Normal affect   ASSESSMENT:    SOB   Qtc- benign; eligible for antipsychotics as needed.  PLAN:    In order of problems listed above:  ***      {Are you ordering a CV Procedure (e.g. stress test, cath, DCCV, TEE, etc)?   Press F2        :[id number]}    Medication Adjustments/Labs and Tests Ordered: Current medicines are reviewed at length with the patient today.  Concerns regarding medicines are outlined above.  No orders of the defined types were placed in this encounter.  No orders of the defined types were placed in this encounter.   There are no Patient Instructions on file for this visit.   Signed, Janina Mayo, MD  11/21/2021 3:54 PM    [place]

## 2021-11-22 ENCOUNTER — Encounter: Payer: Self-pay | Admitting: Internal Medicine

## 2021-11-22 ENCOUNTER — Ambulatory Visit: Payer: Medicaid Other | Attending: Internal Medicine | Admitting: Internal Medicine

## 2021-11-22 VITALS — BP 108/72 | HR 72 | Ht 74.0 in | Wt 229.0 lb

## 2021-11-22 DIAGNOSIS — Z79899 Other long term (current) drug therapy: Secondary | ICD-10-CM | POA: Insufficient documentation

## 2021-11-22 MED ORDER — ROSUVASTATIN CALCIUM 20 MG PO TABS: 20.0000 mg | ORAL_TABLET | Freq: Every day | ORAL | 3 refills | Status: DC

## 2021-11-22 NOTE — Patient Instructions (Signed)
Medication Instructions:  Increase Crestor from 10 mg to 20 mg ( Take 1 Tablet Daily). *If you need a refill on your cardiac medications before your next appointment, please call your pharmacy*   Lab Work: Lipid panel 3 months If you have labs (blood work) drawn today and your tests are completely normal, you will receive your results only by: [place] (if you have MyChart) OR A paper copy in the mail If you have any lab test that is abnormal or we need to change your treatment, we will call you to review the results.   Testing/Procedures: No Testing   Follow-Up: At [facility], you and your health needs are our priority.  As part of our continuing mission to provide you with exceptional heart care, we have created designated Provider Care Teams.  These Care Teams include your primary Cardiologist (physician) and Advanced Practice Providers (APPs -  Physician Assistants and Nurse Practitioners) who all work together to provide you with the care you need, when you need it.  We recommend signing up for the patient portal called "MyChart".  Sign up information is provided on this After Visit Summary.  MyChart is used to connect with patients for Virtual Visits (Telemedicine).  Patients are able to view lab/test results, encounter notes, upcoming appointments, etc.  Non-urgent messages can be sent to your provider as well.   To learn more about what you can do with MyChart, go to [email or web address].    Your next appointment:   Follow up As Needed  The format for your next appointment:   In Person  Provider:   Janina Mayo, MD

## 2021-11-26 ENCOUNTER — Encounter (HOSPITAL_COMMUNITY): Payer: Self-pay | Admitting: Emergency Medicine

## 2021-11-26 ENCOUNTER — Other Ambulatory Visit: Payer: Self-pay

## 2021-11-26 ENCOUNTER — Emergency Department (HOSPITAL_COMMUNITY)
Admission: EM | Admit: 2021-11-26 | Discharge: 2021-11-28 | Discharge status: Against Medical Advice | Payer: Medicaid Other | Attending: Emergency Medicine | Admitting: Emergency Medicine

## 2021-11-26 DIAGNOSIS — F250 Schizoaffective disorder, bipolar type: Secondary | ICD-10-CM | POA: Diagnosis not present

## 2021-11-26 DIAGNOSIS — Z91040 Latex allergy status: Secondary | ICD-10-CM | POA: Insufficient documentation

## 2021-11-26 DIAGNOSIS — T391X2A Poisoning by 4-Aminophenol derivatives, intentional self-harm, initial encounter: Secondary | ICD-10-CM | POA: Insufficient documentation

## 2021-11-26 DIAGNOSIS — X838XXA Intentional self-harm by other specified means, initial encounter: Secondary | ICD-10-CM | POA: Diagnosis not present

## 2021-11-26 DIAGNOSIS — T50902A Poisoning by unspecified drugs, medicaments and biological substances, intentional self-harm, initial encounter: Secondary | ICD-10-CM | POA: Diagnosis not present

## 2021-11-26 DIAGNOSIS — R109 Unspecified abdominal pain: Secondary | ICD-10-CM | POA: Insufficient documentation

## 2021-11-26 HISTORY — DX: Poisoning by unspecified drugs, medicaments and biological substances, intentional self-harm, initial encounter: T50.902A

## 2021-11-26 LAB — COMPREHENSIVE METABOLIC PANEL
ALT: 54 U/L — ABNORMAL HIGH (ref 0–44)
AST: 41 U/L (ref 15–41)
Albumin: 3.3 g/dL — ABNORMAL LOW (ref 3.5–5.0)
Alkaline Phosphatase: 65 U/L (ref 38–126)
Anion gap: 3 — ABNORMAL LOW (ref 5–15)
BUN: 15 mg/dL (ref 6–20)
CO2: 23 mmol/L (ref 22–32)
Calcium: 7.4 mg/dL — ABNORMAL LOW (ref 8.9–10.3)
Chloride: 114 mmol/L — ABNORMAL HIGH (ref 98–111)
Creatinine, Ser: 0.64 mg/dL (ref 0.61–1.24)
GFR, Estimated: >60 mL/min (ref >60)
Glucose, Bld: 82 mg/dL (ref 70–99)
Potassium: 3.2 mmol/L — ABNORMAL LOW (ref 3.5–5.1)
Sodium: 140 mmol/L (ref 135–145)
Total Bilirubin: 0.8 mg/dL (ref 0.3–1.2)
Total Protein: 6.5 g/dL (ref 6.5–8.1)

## 2021-11-26 LAB — RAPID URINE DRUG SCREEN, HOSP PERFORMED
Amphetamines: NOT DETECTED
Barbiturates: NOT DETECTED
Benzodiazepines: NOT DETECTED
Cocaine: POSITIVE — AB
Opiates: NOT DETECTED
Tetrahydrocannabinol: NOT DETECTED

## 2021-11-26 LAB — ETHANOL: Alcohol, Ethyl (B): <10 mg/dL (ref <10)

## 2021-11-26 LAB — SALICYLATE LEVEL: Salicylate Lvl: <7 mg/dL — ABNORMAL LOW (ref 7.0–30.0)

## 2021-11-26 LAB — CBC
HCT: 42.4 % (ref 39.0–52.0)
Hemoglobin: 14 g/dL (ref 13.0–17.0)
MCH: 30.7 pg (ref 26.0–34.0)
MCHC: 33 g/dL (ref 30.0–36.0)
MCV: 93 fL (ref 80.0–100.0)
Platelets: 184 10*3/uL (ref 150–400)
RBC: 4.56 MIL/uL (ref 4.22–5.81)
RDW: 13.2 % (ref 11.5–15.5)
WBC: 7 10*3/uL (ref 4.0–10.5)
nRBC: 0 % (ref 0.0–0.2)

## 2021-11-26 LAB — ACETAMINOPHEN LEVEL: Acetaminophen (Tylenol), Serum: <10 ug/mL — ABNORMAL LOW (ref 10–30)

## 2021-11-26 MED ORDER — QUETIAPINE FUMARATE 150 MG PO TABS: 150.0000 mg | ORAL_TABLET | Freq: Every day | ORAL | Status: DC

## 2021-11-26 MED ORDER — ZOLPIDEM TARTRATE 5 MG PO TABS
5.0000 mg | ORAL_TABLET | Freq: Every evening | ORAL | Status: DC | PRN
  Administered 2021-11-26: 5 mg via ORAL
  Filled 2021-11-26: qty 1

## 2021-11-26 MED ORDER — ROSUVASTATIN CALCIUM 20 MG PO TABS
20.0000 mg | ORAL_TABLET | Freq: Every day | ORAL | Status: DC
  Administered 2021-11-27: 20 mg via ORAL
  Filled 2021-11-26: qty 1

## 2021-11-26 MED ORDER — TRAZODONE HCL 100 MG PO TABS
150.0000 mg | ORAL_TABLET | Freq: Every day | ORAL | Status: DC
  Administered 2021-11-26 – 2021-11-27 (×2): 150 mg via ORAL
  Filled 2021-11-26 (×2): qty 2

## 2021-11-26 MED ORDER — ALUM & MAG HYDROXIDE-SIMETH 200-200-20 MG/5ML PO SUSP: 30.0000 mL | Freq: Four times a day (QID) | ORAL | Status: DC | PRN

## 2021-11-26 MED ORDER — LIDOCAINE 5 % EX PTCH
1.0000 | MEDICATED_PATCH | CUTANEOUS | Status: DC
  Administered 2021-11-26 – 2021-11-27 (×2): 1 via TRANSDERMAL
  Filled 2021-11-26 (×2): qty 1

## 2021-11-26 MED ORDER — ONDANSETRON HCL 4 MG PO TABS: 4.0000 mg | ORAL_TABLET | Freq: Three times a day (TID) | ORAL | Status: DC | PRN

## 2021-11-26 MED ORDER — QUETIAPINE FUMARATE 50 MG PO TABS
150.0000 mg | ORAL_TABLET | Freq: Every day | ORAL | Status: DC
  Administered 2021-11-26 – 2021-11-27 (×2): 150 mg via ORAL
  Filled 2021-11-26 (×2): qty 1

## 2021-11-26 MED ORDER — BICTEGRAVIR-EMTRICITAB-TENOFOV 50-200-25 MG PO TABS
1.0000 | ORAL_TABLET | Freq: Every day | ORAL | Status: DC
  Filled 2021-11-26 (×2): qty 1

## 2021-11-26 MED ORDER — POTASSIUM CHLORIDE CRYS ER 20 MEQ PO TBCR
40.0000 meq | EXTENDED_RELEASE_TABLET | Freq: Once | ORAL | Status: AC
  Administered 2021-11-26: 40 meq via ORAL
  Filled 2021-11-26: qty 2

## 2021-11-26 MED ORDER — HYDROXYZINE HCL 25 MG PO TABS
25.0000 mg | ORAL_TABLET | Freq: Three times a day (TID) | ORAL | Status: DC | PRN
  Administered 2021-11-27: 25 mg via ORAL
  Filled 2021-11-26: qty 1

## 2021-11-26 MED ORDER — NICOTINE 21 MG/24HR TD PT24
21.0000 mg | MEDICATED_PATCH | Freq: Every day | TRANSDERMAL | Status: DC
  Administered 2021-11-26 – 2021-11-27 (×2): 21 mg via TRANSDERMAL
  Filled 2021-11-26 (×2): qty 1

## 2021-11-26 MED ORDER — GABAPENTIN 300 MG PO CAPS
600.0000 mg | ORAL_CAPSULE | Freq: Three times a day (TID) | ORAL | Status: DC
  Administered 2021-11-26 – 2021-11-27 (×3): 600 mg via ORAL
  Filled 2021-11-26 (×3): qty 2

## 2021-11-26 NOTE — ED Triage Notes (Signed)
Patient presents due to a suicide attempt.He tried to commit suicide by jumping from a bridge, but was unable to bring himself to do it. Next, the patient bought a bottle of tylenol in an attempt to commit suicide with it in the woods. He believes he took about 8 pills. The dosage is unknown. He now complains of bilateral upper quadrant abdominal pain. EMS reports patient experiences auditory hallucinations that keep telling him to go back to the bridge. Patient admits to cocaine use 24 hours ago.     HX: PACs, SI  EMS vitals: 96 CBG 140/90 BP 76 HR 99% SPO2 on room air

## 2021-11-26 NOTE — Consult Note (Signed)
Telepsych Consultation   Reason for Consult:  Telepsych Assessment for suicide attempt Referring Physician:  Sherwood Gambler, MD  Location of Patient:    [facility]  Location of Provider: Other: [facility]  Patient Identification: Dylan [name] MRN:  [id number] Principal Diagnosis: Suicide attempt by acetaminophen overdose [facility]) Diagnosis:  Principal Problem:   Suicide attempt by acetaminophen overdose [facility]) Active Problems:   Schizoaffective disorder, bipolar type ([place])   Total Time spent with patient: 30 minutes  Subjective:   Dylan [name] is a 56 y.o. male patient admitted with unwitnessed suicide attempt via tylenol overdose.  His admission UDS was positive for cocaine.   HPI:   Patient seen via telepsych by this provider; chart reviewed and consulted with Dr. Dwyane Dee on 11/26/21.  On evaluation Dylan [name] reports  he attempted to end his life via tylenol overdose.  He reports being triggered by memories of his late daughter.  His daughter was 3 when she passed away,October is the anniversary month of her death.  Pt reports he's had thoughts of self harm for a few months that his exacerbated a few weeks ago.  Historically, he wanted to jump off  a bridge but could not bring himself to do this so he purchased tylenol and took approximately 8 pills, unknown dosage. Pt endorses recreational cocaine and alcohol usage.  Last usage was 3 days prior.   Pt reports a hx for bipolar schizoaffective disorder, and states he's followed by [facility] for medication management.  Unfortunately, he reports he stopped taking seroquel, hydroxyzine and trazodone about one month ago but cannot explain why.  Pt reports his medications help improve his mood when he takes them.      Reviewed labs: K+ is low at 3.2-received Potassium 65mEQ supplementation; LFT are WNL with the exception of ALT which is 54U/L but still within limits to restart psychiatric medications.   During evaluation Dylan [name] is  seated upright on the hospital bed; He is alert/oriented x 4; appears anxious, depressed but cooperative; and mood congruent with affect.  Patient is speaking in a clear tone at moderate volume, and normal pace; with fair contact.  His thought  process is coherent and relevant; There is no indication that he is currently responding to internal/external stimuli or experiencing delusional thought content.  Patient endorses suicidal ideation and recent suicide attempt.  He denies homicidal ideation, psychosis, and paranoia.  Patient has remained cooperative throughout assessment and has answered questions appropriately.    Per ED Provider Admission Assessment 11/26/2021: History      Chief Complaint  Patient presents with   Abdominal Pain   Suicide Attempt      Dylan [name] is a 56 y.o. male.   HPI 55 year old presents with suicidal ideation and attempt.  He has been feeling this way since he had a dream about his deceased daughter a few days ago.  Whenever he feels this way he just wants to be with her.  He thought about jumping off a bridge yesterday but there were too many people around.  Instead he bought a small bottle of Tylenol and took what he estimates was around 8 tablets last night.  This was around 5 PM.  Had some stomach discomfort this morning and so he came for evaluation.  He still feels like he is worthless and does not want to live. Past Psychiatric History: Schizoaffective disorder, bipolar type; cocaine use.  Pt was recently admitted to [facility] 2 months prior for GAD.   Risk  to Self:  yes Risk to Others:  no Prior Inpatient Therapy: yes  Prior Outpatient Therapy:  yes, [facility]  Past Medical History:  Past Medical History:  Diagnosis Date   Anxiety    Arthritis    Bipolar 1 disorder ([place])    Colon polyps    Depression    GERD (gastroesophageal reflux disease)    Hepatitis B    Human immunodeficiency virus (HIV) ([place])    Hyperlipidemia    Hypertension    Neuromuscular  disorder ([place])    neuropathy   Neuropathy    Pre-diabetes    Schizophrenia ([place])     Past Surgical History:  Procedure Laterality Date   FOOT ARTHRODESIS Right 09/17/2019   Procedure: FUSION RIGHT LISFRANC JOINT;  Surgeon: Newt Minion, MD;  Location: [facility];  Service: Orthopedics;  Laterality: Right;   FOOT ARTHRODESIS Right 09/2019   HEMORROIDECTOMY     IR FLUORO GUIDE CV LINE RIGHT  02/22/2017   IR US GUIDE VASC ACCESS RIGHT  02/22/2017   TUMOR REMOVAL     From Chest   Family History:  Family History  Problem Relation Age of Onset   CAD Mother    Diabetes Father    Stroke Father    Colon cancer Maternal Aunt 60   Diabetes Maternal Aunt    Heart disease Maternal Uncle    Heart attack Maternal Uncle    Esophageal cancer Neg Hx    Rectal cancer Neg Hx    Stomach cancer Neg Hx    Family Psychiatric  History: deerred Social History:  Social History   Substance and Sexual Activity  Alcohol Use Yes   Comment: occassional drinker     Social History   Substance and Sexual Activity  Drug Use Yes   Types: Cocaine   Comment: last use was Friday 03/05/2020    Social History   Socioeconomic History   Marital status: Married    Spouse name: Not on file   Number of children: 1   Years of education: Not on file   Highest education level: Not on file  Occupational History   Not on file  Tobacco Use   Smoking status: Every Day    Packs/day: 0.50    Years: 25.00    Total pack years: 12.50    Types: E-cigarettes, Cigarettes   Smokeless tobacco: Never   Tobacco comments:    Vapes daily  Vaping Use   Vaping Use: Every day   Start date: 12/09/2020   Substances: Nicotine  Substance and Sexual Activity   Alcohol use: Yes    Comment: occassional drinker   Drug use: Yes    Types: Cocaine    Comment: last use was Friday 03/05/2020   Sexual activity: Yes    Partners: Female  Other Topics Concern   Not on file  Social History Narrative   Lives home alone.    Social  Determinants of Health   Financial Resource Strain: Not on file  Food Insecurity: Not on file  Transportation Needs: Not on file  Physical Activity: Not on file  Stress: Not on file  Social Connections: Not on file   Additional Social History:    Allergies:   Allergies  Allergen Reactions   Penicillins Other (See Comments)    Childhood allergy   Pork-Derived Products Other (See Comments)    Patient preference   Latex Rash   Tape Rash    Labs:  Results for orders placed or performed during the  hospital encounter of 11/26/21 (from the past 48 hour(s))  Comprehensive metabolic panel     Status: Abnormal   Collection Time: 11/26/21 11:51 AM  Result Value Ref Range   Sodium 140 135 - 145 mmol/L   Potassium 3.2 (L) 3.5 - 5.1 mmol/L   Chloride 114 (H) 98 - 111 mmol/L   CO2 23 22 - 32 mmol/L   Glucose, Bld 82 70 - 99 mg/dL    Comment: Glucose reference range applies only to samples taken after fasting for at least 8 hours.   BUN 15 6 - 20 mg/dL   Creatinine, Ser 0.64 0.61 - 1.24 mg/dL   Calcium 7.4 (L) 8.9 - 10.3 mg/dL   Total Protein 6.5 6.5 - 8.1 g/dL   Albumin 3.3 (L) 3.5 - 5.0 g/dL   AST 41 15 - 41 U/L   ALT 54 (H) 0 - 44 U/L   Alkaline Phosphatase 65 38 - 126 U/L   Total Bilirubin 0.8 0.3 - 1.2 mg/dL   GFR, Estimated >60 >60 mL/min    Comment: (NOTE) Calculated using the CKD-EPI Creatinine Equation (2021)    Anion gap 3 (L) 5 - 15    Comment: Performed at [facility], [place] [address], [place], [place] [address]  cbc     Status: None   Collection Time: 11/26/21 11:51 AM  Result Value Ref Range   WBC 7.0 4.0 - 10.5 K/uL   RBC 4.56 4.22 - 5.81 MIL/uL   Hemoglobin 14.0 13.0 - 17.0 g/dL   HCT 42.4 39.0 - 52.0 %   MCV 93.0 80.0 - 100.0 fL   MCH 30.7 26.0 - 34.0 pg   MCHC 33.0 30.0 - 36.0 g/dL   RDW 13.2 11.5 - 15.5 %   Platelets 184 150 - 400 K/uL   nRBC 0.0 0.0 - 0.2 %    Comment: Performed at [facility], [place] [address] [place], [place] [address]  Ethanol     Status: None   Collection Time: 11/26/21 11:54 AM  Result Value Ref Range   Alcohol, Ethyl (B) <10 <10 mg/dL    Comment: (NOTE) Lowest detectable limit for serum alcohol is 10 mg/dL.  For medical purposes only. Performed at [facility], [place] [address], [place], [place] [address]   Salicylate level     Status: Abnormal   Collection Time: 11/26/21 11:54 AM  Result Value Ref Range   Salicylate Lvl <5.6 (L) 7.0 - 30.0 mg/dL    Comment: Performed at [facility], [place] [address], [place], [place] [address]  Acetaminophen level     Status: Abnormal   Collection Time: 11/26/21 11:54 AM  Result Value Ref Range   Acetaminophen (Tylenol), Serum <10 (L) 10 - 30 ug/mL    Comment: (NOTE) Therapeutic concentrations vary significantly. A range of 10-30 ug/mL  may be an effective concentration for many patients. However, some  are best treated at concentrations outside of this range. Acetaminophen concentrations >150 ug/mL at 4 hours after ingestion  and >50 ug/mL at 12 hours after ingestion are often associated with  toxic reactions.  Performed at [facility], [place] [address] [place], [place] [address]   Rapid urine drug screen (hospital performed)     Status: Abnormal   Collection Time: 11/26/21 12:26 PM  Result Value Ref Range   Opiates NONE DETECTED NONE DETECTED   Cocaine POSITIVE (A) NONE DETECTED   Benzodiazepines NONE DETECTED NONE DETECTED  Amphetamines NONE DETECTED NONE DETECTED   Tetrahydrocannabinol NONE DETECTED NONE DETECTED   Barbiturates NONE DETECTED NONE DETECTED    Comment: (NOTE) DRUG SCREEN FOR MEDICAL PURPOSES ONLY.  IF CONFIRMATION IS NEEDED FOR ANY PURPOSE, NOTIFY LAB WITHIN 5 DAYS.  LOWEST DETECTABLE LIMITS FOR URINE DRUG SCREEN Drug Class                     Cutoff (ng/mL) Amphetamine and metabolites    1000 Barbiturate and metabolites    200 Benzodiazepine                  200 Opiates and metabolites        300 Cocaine and metabolites        300 THC                            50 Performed at [facility], [place] [address], [place], [name] [address]     Medications:  Current Facility-Administered Medications  Medication Dose Route Frequency Provider Last Rate Last Admin   alum & mag hydroxide-simeth (MAALOX/MYLANTA) 200-200-20 MG/5ML suspension 30 mL  30 mL Oral Q6H PRN Sherwood Gambler, MD       nicotine (NICODERM CQ - dosed in mg/24 hours) patch 21 mg  21 mg Transdermal Daily Sherwood Gambler, MD   21 mg at 11/26/21 1312   ondansetron (ZOFRAN) tablet 4 mg  4 mg Oral Q8H PRN Sherwood Gambler, MD       zolpidem (AMBIEN) tablet 5 mg  5 mg Oral QHS PRN Sherwood Gambler, MD       Current Outpatient Medications  Medication Sig Dispense Refill   ALEVE 220 MG tablet Take 220-440 mg by mouth 2 (two) times daily as needed (for pain).     bictegravir-emtricitabine-tenofovir AF (BIKTARVY) 50-200-25 MG TABS tablet Take 1 tablet by mouth daily. 30 tablet 11   diclofenac Sodium (VOLTAREN) 1 % GEL Apply 2-4 g topically See admin instructions. Apply 2-4 grams topically to the right foot/ankle three to four times a day for pain     gabapentin (NEURONTIN) 300 MG capsule Take 2 capsules (600 mg total) by mouth 3 (three) times daily. For pain (Patient taking differently: Take 600 mg by mouth See admin instructions. Take 600 mg by mouth in the morning, afternoon, and at bedtime) 180 capsule 0   hydrOXYzine (ATARAX) 25 MG tablet Take 1 tablet (25 mg total) by mouth 3 (three) times daily as needed for anxiety. 75 tablet 0   Nicotine (NICODERM CQ TD) Place 1 patch onto the skin daily as needed (for vaping/smoking cessation while hospitalized).     QUEtiapine (SEROQUEL) 50 MG tablet Take 150 mg by mouth at bedtime.     rosuvastatin (CRESTOR) 10 MG tablet Take 10 mg by mouth at bedtime.     traZODone (DESYREL) 150 MG tablet Take 1 tablet (150 mg total)  by mouth at bedtime. For sleep (Patient taking differently: Take 150 mg by mouth at bedtime as needed for sleep.) 30 tablet 0   QUEtiapine Fumarate 150 MG TABS Take 150 mg by mouth at bedtime. For mood control (Patient not taking: Reported on 11/26/2021) 30 tablet 0   rosuvastatin (CRESTOR) 20 MG tablet Take 1 tablet (20 mg total) by mouth daily. (Patient not taking: Reported on 11/26/2021) 90 tablet 3   triamcinolone ointment (KENALOG) 0.1 % Apply topically 2 (two) times daily. For fungal growth to right toes. (  Patient not taking: Reported on 11/26/2021) 30 g 0    Musculoskeletal: pt moves all extremities and ambulates independently.  Strength & Muscle Tone: within normal limits Gait & Station: normal Patient leans: N/A  Psychiatric Specialty Exam:  Presentation  General Appearance:  Fairly Groomed  Eye Contact: Fair  Speech: Clear and Coherent; Slow  Speech Volume: Decreased  Handedness: Right   Mood and Affect  Mood: Anxious; Depressed  Affect: Congruent; Depressed; Constricted   Thought Process  Thought Processes: Coherent  Descriptions of Associations:Intact  Orientation:Full (Time, Place and Person)  Thought Content:Illogical  History of Schizophrenia/Schizoaffective disorder:Yes  Duration of Psychotic Symptoms:Greater than six months  Hallucinations:Hallucinations: None  Ideas of Reference:None  Suicidal Thoughts:Suicidal Thoughts: Yes, Active SI Active Intent and/or Plan: With Intent; With Plan; With Means to Carry Out  Homicidal Thoughts:Homicidal Thoughts: No   Sensorium  Memory: Immediate Good; Recent Good; Remote Good  Judgment: Poor (impulsive AEB recent suicide attempt and cocaine use)  Insight: Fair   Community education officer  Concentration: Fair  Attention Span: Fair  Recall: Roel Cluck of Knowledge: Fair  Language: Good   Psychomotor Activity  Psychomotor Activity:Psychomotor Activity: Normal   Assets   Assets: Communication Skills; Housing   Sleep  Sleep:Number of Hours of Sleep: 5    Physical Exam: Physical Exam Vitals and nursing note reviewed.  Neurological:     General: No focal deficit present.     Mental Status: He is alert and oriented to person, place, and time.  Psychiatric:        Attention and Perception: Attention normal. He is attentive. He does not perceive auditory or visual hallucinations.        Mood and Affect: Mood is anxious and depressed.        Speech: Speech normal.        Behavior: Behavior normal. Behavior is cooperative.        Thought Content: Thought content is not paranoid or delusional. Thought content includes suicidal ideation. Thought content does not include homicidal ideation. Thought content includes suicidal plan. Thought content does not include homicidal plan.        Cognition and Memory: Cognition and memory normal.        Judgment: Judgment is impulsive and inappropriate.    Review of Systems  Constitutional: Negative.   HENT: Negative.    Eyes: Negative.   Respiratory: Negative.    Cardiovascular: Negative.   Gastrointestinal:  Negative for abdominal pain (was present on admission but currently denies).  Genitourinary: Negative.   Musculoskeletal: Negative.   Skin: Negative.   Neurological: Negative.   Endo/Heme/Allergies: Negative.   Psychiatric/Behavioral:  Positive for depression, substance abuse and suicidal ideas. Negative for hallucinations and memory loss. The patient is nervous/anxious.    Blood pressure (!) 136/92, pulse 71, temperature 98 F (36.7 C), temperature source Oral, resp. rate 13, SpO2 98 %. There is no height or weight on file to calculate BMI.  Treatment Plan Summary: Patient presents voluntarily with reports of suicide attempt tylenol overdose.  On assessment today, pt reports he intended to end his life, triggered by the anniversary of his daughter's death.  At present, he cannot contract for safety, has  poor judgment and lacks insight.  He is alert and oriented, does not appear psychotic and is not responding to internal stimulus.   Daily contact with patient to assess and evaluate symptoms and progress in treatment and Medication management  Pending updated EKG to rule out prolonged QT intervals, can  restart psychiatric home medications.   Disposition: Recommend psychiatric Inpatient admission when medically cleared. [facility] is currently reviewing for admission.    This service was provided via telemedicine using a 2-way, interactive audio and video technology.  Names of all persons participating in this telemedicine service and their role in this encounter. Name: [name] Role: Patient  Name: Merlyn Lot Role: [place]  Name: Hampton Abbot Role: Psychiatrist    Mallie Darting, NP 11/26/2021 5:00 PM

## 2021-11-26 NOTE — ED Notes (Signed)
Pt changed into purple scrubs. Pt belongings placed in nurse's station cabinet 9-12. Pt has two belongings bags.

## 2021-11-26 NOTE — ED Provider Notes (Signed)
[facility] DEPT Provider Note   CSN: [id number] Arrival date & time: 11/26/21  1136     History  Chief Complaint  Patient presents with   Abdominal Pain   Suicide Attempt    Dylan [name] is a 56 y.o. male.  HPI 56 year old presents with suicidal ideation and attempt.  He has been feeling this way since he had a dream about his deceased daughter a few days ago.  Whenever he feels this way he just wants to be with her.  He thought about jumping off a bridge yesterday but there were too many people around.  Instead he bought a small bottle of Tylenol and took what he estimates was around 8 tablets last night.  This was around 5 PM.  Had some stomach discomfort this morning and so he came for evaluation.  He still feels like he is worthless and does not want to live.  Home Medications Prior to Admission medications   Medication Sig Start Date End Date Taking? Authorizing Provider  bictegravir-emtricitabine-tenofovir AF (BIKTARVY) 50-200-25 MG TABS tablet Take 1 tablet by mouth daily. 11/08/21   Carlyle Basques, MD  gabapentin (NEURONTIN) 300 MG capsule Take 2 capsules (600 mg total) by mouth 3 (three) times daily. For pain Patient taking differently: Take 600 mg by mouth 3 (three) times daily. 11/08/21   Carlyle Basques, MD  hydrOXYzine (ATARAX) 25 MG tablet Take 1 tablet (25 mg total) by mouth 3 (three) times daily as needed for anxiety. 09/02/21   Lindell Spar I, NP  QUEtiapine Fumarate 150 MG TABS Take 150 mg by mouth at bedtime. For mood control 09/02/21   Lindell Spar I, NP  rosuvastatin (CRESTOR) 10 MG tablet Take 10 mg by mouth daily.    [provider]  rosuvastatin (CRESTOR) 20 MG tablet Take 1 tablet (20 mg total) by mouth daily. Patient not taking: Reported on 11/26/2021 11/22/21 02/20/22  Janina Mayo, MD  traZODone (DESYREL) 150 MG tablet Take 1 tablet (150 mg total) by mouth at bedtime. For sleep Patient taking differently: Take 150  mg by mouth at bedtime as needed for sleep. 09/02/21   Lindell Spar I, NP  triamcinolone ointment (KENALOG) 0.1 % Apply topically 2 (two) times daily. For fungal growth to right toes. 09/02/21   Lindell Spar I, NP      Allergies    Penicillins, Pork-derived products, Latex, and Tape    Review of Systems   Review of Systems  Cardiovascular:  Negative for chest pain.  Gastrointestinal:  Positive for abdominal pain and nausea. Negative for vomiting.  Psychiatric/Behavioral:  Positive for suicidal ideas.     Physical Exam Updated Vital Signs BP (!) 136/92   Pulse 71   Temp 98 F (36.7 C) (Oral)   Resp 13   SpO2 98%  Physical Exam Vitals and nursing note reviewed.  Constitutional:      Appearance: He is well-developed.  HENT:     Head: Normocephalic and atraumatic.  Cardiovascular:     Rate and Dylan: Normal rate and regular Dylan.     Heart sounds: Normal heart sounds.  Pulmonary:     Effort: Pulmonary effort is normal.     Breath sounds: Normal breath sounds.  Abdominal:     Palpations: Abdomen is soft.     Tenderness: There is no abdominal tenderness.  Skin:    General: Skin is warm and dry.  Neurological:     Mental Status: He is alert.  Psychiatric:  Thought Content: Thought content includes suicidal ideation.     ED Results / Procedures / Treatments   Labs (all labs ordered are listed, but only abnormal results are displayed) Labs Reviewed  COMPREHENSIVE METABOLIC PANEL - Abnormal; Notable for the following components:      Result Value   Potassium 3.2 (*)    Chloride 114 (*)    Calcium 7.4 (*)    Albumin 3.3 (*)    ALT 54 (*)    Anion gap 3 (*)    All other components within normal limits  SALICYLATE LEVEL - Abnormal; Notable for the following components:   Salicylate Lvl <9.0 (*)    All other components within normal limits  ACETAMINOPHEN LEVEL - Abnormal; Notable for the following components:   Acetaminophen (Tylenol), Serum <10 (*)    All  other components within normal limits  RAPID URINE DRUG SCREEN, HOSP PERFORMED - Abnormal; Notable for the following components:   Cocaine POSITIVE (*)    All other components within normal limits  ETHANOL  CBC    EKG None  Radiology No results found.  Procedures Procedures    Medications Ordered in ED Medications  potassium chloride SA (KLOR-CON M) CR tablet 40 mEq (has no administration in time range)  zolpidem (AMBIEN) tablet 5 mg (has no administration in time range)  nicotine (NICODERM CQ - dosed in mg/24 hours) patch 21 mg (has no administration in time range)  alum & mag hydroxide-simeth (MAALOX/MYLANTA) 200-200-20 MG/5ML suspension 30 mL (has no administration in time range)  ondansetron (ZOFRAN) tablet 4 mg (has no administration in time range)    ED Course/ Medical Decision Making/ A&P                           Medical Decision Making Amount and/or Complexity of Data Reviewed Labs: ordered.  Risk OTC drugs. Prescription drug management.   Patient presents after intentional drug overdose.  From a Tylenol overdose perspective, his Tylenol level is normal and LFTs are unremarkable besides an ALT of 54 which is not far off baseline.  I highly doubt liver injury.  We will replace his potassium for a mildly low potassium.  He notes that he had cocaine 3 days ago.  He has a known history of schizoaffective disorder.  At this point he seems medically stable for psychiatric disposition and psychiatry has been consulted.        Final Clinical Impression(s) / ED Diagnoses Final diagnoses:  Intentional drug overdose, initial encounter [facility])    Rx / DC Orders ED Discharge Orders     None         Sherwood Gambler, MD 11/26/21 1537

## 2021-11-26 NOTE — ED Notes (Signed)
Patient reports wanting to fall asleep and not wake up after having a dream about his deceased daughter. He says he just wanted to stay with her.

## 2021-11-27 NOTE — ED Notes (Signed)
Pt refused vital signs at this time.

## 2021-11-27 NOTE — Progress Notes (Addendum)
Per Dylan [name] admissions, pt has been accepted to [facility] [place] [facility] unit. Accepting provider is Dr. Kathlene Cote. Patient can arrive today. Number for report is [phone]. Acceptance pending the patient being able to bring supply of HIV medications due to the facility unable to provide medication.   Glennie Isle, MSW, LCSW-A Phone: [phone] Disposition/TOC

## 2021-11-27 NOTE — Progress Notes (Signed)
CSW spoke with Dylan [name] with [name] who advised that the patient is under further review.  Dylan [name], MSW, Dylan [name] Phone: [phone] Disposition/TOC

## 2021-11-27 NOTE — ED Notes (Signed)
Patient is up eating breakfast and lunch meals now

## 2021-11-27 NOTE — ED Notes (Signed)
Patient became very upset after asking sitter a question and being instructed to return to room.  Patient yelling at sitter and stating that "he is grown up and not a prisoner."  Security and charge RN speaking to patient at this time.  Patient able to be calmed and apologized for behavior

## 2021-11-27 NOTE — Progress Notes (Signed)
Patient was declined for acceptance at [facility], currently no appropriate beds.  Glennie Isle, MSW, [facility] Compton Phone: [phone] Disposition/TOC

## 2021-11-27 NOTE — ED Notes (Signed)
Patient asleep for breakfast also pt refusing vitals pt safe and at asleep at this time

## 2021-11-27 NOTE — Progress Notes (Signed)
Per Shnese Mills, NP, patient meets criteria for inpatient treatment. There are no available beds at [facility] today. CSW faxed referrals to the following facilities for review:  [facility]  Pending - Request Sent N/A [address] [place] [address] [phone] --  [facility]  Pending - Request Sent N/A [address] [place] [address] [phone] --  [facility]  Pending - Request Sent N/A [address] [place] [address] [phone] --  [facility]  Pending - Request Sent N/A [address], [place] [address] [phone] --  [facility]  Pending - Request Sent N/A [address], [place] [address] [phone] --  [facility]  Pending - Request Sent N/A [address] [place] [address] [phone] --  [facility]  Pending - Request Sent N/A [address] [place] [address] [phone] --  [facility]  Pending - Request Sent N/A [address] [place] [address] [phone] --  [facility]  Pending - Request Sent N/A [address] [place] [address] [phone] --  [facility]  Pending - Request Sent N/A [address], [place] [address] [phone] --  [facility]  Pending - Request Sent N/A [address], [place] [address] [phone] --  [facility]  Pending - Request Sent N/A [address] [place] [address] [phone] --  [facility]  Pending - Request Sent N/A [address], [place] [address] [phone] --  [facility]  Pending - Request Sent N/A [address], [place]  [address] [phone] --   TTS will continue to seek bed placement.  Violetta Lavalle, MSW, LCSW-A, LCAS Phone: [phone] Disposition/TOC  

## 2021-11-28 NOTE — ED Notes (Signed)
Pt upset that he was going to be moved to the hallway.  Pt then asking to leave. Pt is denying SI and HI at this time. Pt is not IVC'd. Pt given belongings and ambulated out of dept with security.

## 2021-11-28 NOTE — ED Provider Notes (Signed)
Emergency Medicine Observation Re-evaluation Note  Dylan [name] is a 56 y.o. male, seen on rounds today.  Pt initially presented to the ED for complaints of Abdominal Pain and Suicide Attempt Currently, the patient is waiting to be placed as an inpatient..  Patient is very angry this morning.  He states staff has been rude to him.  Does not want to stay in the hospital.  He denies any thoughts of harming himself or harming anyone else  Physical Exam  BP 125/77 (BP Location: Right Arm)   Pulse 64   Temp (!) 97.3 F (36.3 C) (Oral)   Resp 16   SpO2 97%  Physical Exam General: Awake alert standing at the bedside Cardiac: No rate Lungs: Respiratory difficulty Psych: Angry  ED Course / MDM  EKG:   I have reviewed the labs performed to date as well as medications administered while in observation.  Recent changes in the last 24 hours include frustration this morning.  Plan   Patient was seen by psychiatry.  He was initially evaluated on November 18.  Plan was for psychiatric admission.  According to the last social worker note the patient was post to be transferred to [facility] yesterday afternoon.  Patient is still here in the emergency room this morning.  Patient denies suicidal ideation.  He is frustrated and does not want to stay here any longer.  Patient is not under IVC.  Based on my exam at this time he does not meet criteria for IVC   Dorie Rank, MD 11/28/21 [phone]

## 2021-11-29 ENCOUNTER — Ambulatory Visit (INDEPENDENT_AMBULATORY_CARE_PROVIDER_SITE_OTHER): Payer: Medicaid Other | Admitting: Orthopedic Surgery

## 2021-11-29 DIAGNOSIS — M1712 Unilateral primary osteoarthritis, left knee: Secondary | ICD-10-CM

## 2021-11-30 NOTE — Telephone Encounter (Signed)
Do you need a sheet?

## 2021-12-09 NOTE — Pre-Procedure Instructions (Signed)
Surgical Instructions    Your procedure is scheduled on December 14, 2021.  Report to [facility] Main Entrance "A" at 6:30 A.M., then check in with the Admitting office.  Call this number if you have problems the morning of surgery:  [phone]   If you have any questions prior to your surgery date call [phone]: Open Monday-Friday 8am-4pm  If you experience any cold or flu symptoms such as cough, fever, chills, shortness of breath, etc. between now and your scheduled surgery, please notify us at the above number     Remember:  Do not eat after midnight the night before your surgery  You may drink clear liquids until 5:30 AM the morning of your surgery.   Clear liquids allowed are: Water, Non-Citrus Juices (without pulp), Carbonated Beverages, Clear Tea, Black Coffee Only (NO MILK, CREAM OR POWDERED CREAMER of any kind), and Gatorade.      Take these medicines the morning of surgery with A SIP OF WATER:  bictegravir-emtricitabine-tenofovir AF (BIKTARVY)   gabapentin (NEURONTIN)   rosuvastatin (CRESTOR)   hydrOXYzine (ATARAX) - may take if needed     As of today, STOP taking any Aspirin (unless otherwise instructed by your surgeon) Aleve, Naproxen, Ibuprofen, Motrin, Advil, Goody's, BC's, all herbal medications, fish oil, and all vitamins. This includes your medication: diclofenac Sodium (VOLTAREN) GEL                      Do NOT Smoke (Tobacco/Vaping) for 24 hours prior to your procedure.  If you use a CPAP at night, you may bring your mask/headgear for your overnight stay.   Contacts, glasses, piercing's, hearing aid's, dentures or partials may not be worn into surgery, please bring cases for these belongings.    For patients admitted to the hospital, discharge time will be determined by your treatment team.   Patients discharged the day of surgery will not be allowed to drive home, and someone needs to stay with them for 24 hours.  SURGICAL WAITING ROOM  VISITATION Patients having surgery or a procedure may have no more than 2 support people in the waiting area - these visitors may rotate.   Children under the age of 55 must have an adult with them who is not the patient. If the patient needs to stay at the hospital during part of their recovery, the visitor guidelines for inpatient rooms apply. Pre-op nurse will coordinate an appropriate time for 1 support person to accompany patient in pre-op.  This support person may not rotate.   Please refer to the [facility] website for the visitor guidelines for Inpatients (after your surgery is over and you are in a regular room).    Special instructions:   Dylan [name]- Preparing For Surgery  Before surgery, you can play an important role. Because skin is not sterile, your skin needs to be as free of germs as possible. You can reduce the number of germs on your skin by washing with CHG (chlorahexidine gluconate) Soap before surgery.  CHG is an antiseptic cleaner which kills germs and bonds with the skin to continue killing germs even after washing.    Oral Hygiene is also important to reduce your risk of infection.  Remember - BRUSH YOUR TEETH THE MORNING OF SURGERY WITH YOUR REGULAR TOOTHPASTE  Please do not use if you have an allergy to CHG or antibacterial soaps. If your skin becomes reddened/irritated stop using the CHG.  Do not shave (including legs and underarms) for  at least 48 hours prior to first CHG shower. It is OK to shave your face.  Please follow these instructions carefully.   Shower the NIGHT BEFORE SURGERY and the MORNING OF SURGERY  If you chose to wash your hair, wash your hair first as usual with your normal shampoo.  After you shampoo, rinse your hair and body thoroughly to remove the shampoo.  Use CHG Soap as you would any other liquid soap. You can apply CHG directly to the skin and wash gently with a scrungie or a clean washcloth.   Apply the CHG Soap to your body ONLY  FROM THE NECK DOWN.  Do not use on open wounds or open sores. Avoid contact with your eyes, ears, mouth and genitals (private parts). Wash Face and genitals (private parts)  with your normal soap.   Wash thoroughly, paying special attention to the area where your surgery will be performed.  Thoroughly rinse your body with warm water from the neck down.  DO NOT shower/wash with your normal soap after using and rinsing off the CHG Soap.  Pat yourself dry with a CLEAN TOWEL.  Wear CLEAN PAJAMAS to bed the night before surgery  Place CLEAN SHEETS on your bed the night before your surgery  DO NOT SLEEP WITH PETS.   Day of Surgery: Take a shower with CHG soap. Do not wear jewelry or makeup Do not wear lotions, powders, colognes, or deodorant. Men may shave face and neck. Do not bring valuables to the hospital.  [facility] is not responsible for any belongings or valuables. Wear Clean/Comfortable clothing the morning of surgery Remember to brush your teeth WITH YOUR REGULAR TOOTHPASTE.   Please read over the following fact sheets that you were given.    If you received a COVID test during your pre-op visit  it is requested that you wear a mask when out in public, stay away from anyone that may not be feeling well and notify your surgeon if you develop symptoms. If you have been in contact with anyone that has tested positive in the last 10 days please notify you surgeon.

## 2021-12-12 ENCOUNTER — Other Ambulatory Visit: Payer: Self-pay

## 2021-12-12 ENCOUNTER — Encounter (HOSPITAL_COMMUNITY)
Admission: RE | Admit: 2021-12-12 | Discharge: 2021-12-12 | Disposition: A | Discharge status: Home / Self Care | Payer: Medicaid Other | Source: Ambulatory Visit | Attending: Orthopedic Surgery | Admitting: Orthopedic Surgery

## 2021-12-12 ENCOUNTER — Encounter (HOSPITAL_COMMUNITY): Payer: Self-pay

## 2021-12-12 VITALS — BP 114/89 | HR 66 | Temp 98.1°F | Resp 18 | Ht 74.0 in | Wt 234.0 lb

## 2021-12-12 DIAGNOSIS — Z01818 Encounter for other preprocedural examination: Secondary | ICD-10-CM | POA: Insufficient documentation

## 2021-12-12 LAB — SURGICAL PCR SCREEN
MRSA, PCR: NEGATIVE
Staphylococcus aureus: NEGATIVE

## 2021-12-12 NOTE — Progress Notes (Signed)
PCP - Charlott Rakes, MD Cardiologist - Phineas Inches, MD  PPM/ICD - n/a  Chest x-ray - 10/28/21 EKG - 10/31/21 Stress Test - denies ECHO - denies Cardiac Cath - denies  Sleep Study - denies CPAP - denies  Last dose of GLP1 agonist-  n/a GLP1 instructions: n/a  Blood Thinner Instructions: n/a Aspirin Instructions: n/a  ERAS Protcol -Clear liquids until 0530 DOS. PRE-SURGERY Ensure or G2- none ordered.   COVID TEST- n/a   Anesthesia review: No  Patient denies shortness of breath, fever, cough and chest pain at PAT appointment   All instructions explained to the patient, with a verbal understanding of the material. Patient agrees to go over the instructions while at home for a better understanding. Patient also instructed to self quarantine after being tested for COVID-19. The opportunity to ask questions was provided.

## 2021-12-13 MED ORDER — TRANEXAMIC ACID 1000 MG/10ML IV SOLN
2000.0000 mg | INTRAVENOUS | Status: DC
  Filled 2021-12-13: qty 20

## 2021-12-13 NOTE — Anesthesia Preprocedure Evaluation (Signed)
Anesthesia Evaluation  Patient identified by MRN, date of birth, ID band Patient awake    Reviewed: Allergy & Precautions, NPO status , Patient's Chart, lab work & pertinent test results  History of Anesthesia Complications Negative for: history of anesthetic complications  Airway Mallampati: II   Neck ROM: Full    Dental no notable dental hx. (+) Dental Advisory Given   Pulmonary Current Smoker and Patient abstained from smoking.   Pulmonary exam normal        Cardiovascular hypertension, Normal cardiovascular exam     Neuro/Psych  PSYCHIATRIC DISORDERS Anxiety Depression Bipolar Disorder Schizophrenia  negative neurological ROS     GI/Hepatic Neg liver ROS,GERD  ,,  Endo/Other  negative endocrine ROS    Renal/GU Renal disease     Musculoskeletal negative musculoskeletal ROS (+)    Abdominal   Peds  Hematology  (+) HIV  Anesthesia Other Findings   Reproductive/Obstetrics                             Anesthesia Physical Anesthesia Plan  ASA: 3  Anesthesia Plan: General   Post-op Pain Management:  Regional for Post-op pain and Tylenol PO (pre-op)* and Regional block*   Induction: Intravenous  PONV Risk Score and Plan: 2 and Ondansetron, Dexamethasone and Midazolam  Airway Management Planned: LMA  Additional Equipment:   Intra-op Plan:   Post-operative Plan: Extubation in OR  Informed Consent: I have reviewed the patients History and Physical, chart, labs and discussed the procedure including the risks, benefits and alternatives for the proposed anesthesia with the patient or authorized representative who has indicated his/her understanding and acceptance.     Dental advisory given  Plan Discussed with: Anesthesiologist and CRNA  Anesthesia Plan Comments:         Anesthesia Quick Evaluation

## 2021-12-14 ENCOUNTER — Other Ambulatory Visit: Payer: Self-pay

## 2021-12-14 ENCOUNTER — Observation Stay (HOSPITAL_COMMUNITY)
Admission: RE | Admit: 2021-12-14 | Discharge: 2021-12-16 | Disposition: A | Discharge status: Home / Self Care | Payer: Medicaid Other | Attending: Orthopedic Surgery | Admitting: Orthopedic Surgery

## 2021-12-14 ENCOUNTER — Encounter (HOSPITAL_COMMUNITY): Payer: Self-pay | Admitting: Orthopedic Surgery

## 2021-12-14 ENCOUNTER — Ambulatory Visit (HOSPITAL_BASED_OUTPATIENT_CLINIC_OR_DEPARTMENT_OTHER): Payer: Medicaid Other | Admitting: Certified Registered Nurse Anesthetist

## 2021-12-14 ENCOUNTER — Encounter (HOSPITAL_COMMUNITY)
Admission: RE | Disposition: A | Discharge status: Home / Self Care | Payer: Self-pay | Source: Home / Self Care | Attending: Orthopedic Surgery

## 2021-12-14 ENCOUNTER — Ambulatory Visit (HOSPITAL_COMMUNITY): Payer: Medicaid Other | Admitting: Certified Registered Nurse Anesthetist

## 2021-12-14 DIAGNOSIS — M1712 Unilateral primary osteoarthritis, left knee: Secondary | ICD-10-CM

## 2021-12-14 DIAGNOSIS — B20 Human immunodeficiency virus [HIV] disease: Secondary | ICD-10-CM | POA: Diagnosis not present

## 2021-12-14 DIAGNOSIS — N289 Disorder of kidney and ureter, unspecified: Secondary | ICD-10-CM

## 2021-12-14 DIAGNOSIS — F17210 Nicotine dependence, cigarettes, uncomplicated: Secondary | ICD-10-CM

## 2021-12-14 DIAGNOSIS — Z96652 Presence of left artificial knee joint: Secondary | ICD-10-CM

## 2021-12-14 DIAGNOSIS — Z79899 Other long term (current) drug therapy: Secondary | ICD-10-CM | POA: Diagnosis not present

## 2021-12-14 DIAGNOSIS — I10 Essential (primary) hypertension: Secondary | ICD-10-CM

## 2021-12-14 HISTORY — PX: TOTAL KNEE ARTHROPLASTY: SHX125

## 2021-12-14 HISTORY — DX: Unilateral primary osteoarthritis, left knee: M17.12

## 2021-12-14 SURGERY — ARTHROPLASTY, KNEE, TOTAL
Anesthesia: General | Site: Knee | Laterality: Left

## 2021-12-14 MED ORDER — DOCUSATE SODIUM 100 MG PO CAPS
100.0000 mg | ORAL_CAPSULE | Freq: Two times a day (BID) | ORAL | Status: DC
  Administered 2021-12-14 – 2021-12-16 (×4): 100 mg via ORAL
  Filled 2021-12-14 (×4): qty 1

## 2021-12-14 MED ORDER — DIPHENHYDRAMINE HCL 50 MG/ML IJ SOLN
INTRAMUSCULAR | Status: AC
  Filled 2021-12-14: qty 1

## 2021-12-14 MED ORDER — ACETAMINOPHEN 500 MG PO TABS
1000.0000 mg | ORAL_TABLET | Freq: Once | ORAL | Status: AC
  Administered 2021-12-14: 1000 mg via ORAL
  Filled 2021-12-14: qty 2

## 2021-12-14 MED ORDER — ONDANSETRON HCL 4 MG/2ML IJ SOLN
INTRAMUSCULAR | Status: DC | PRN
  Administered 2021-12-14: 4 mg via INTRAVENOUS

## 2021-12-14 MED ORDER — PROPOFOL 10 MG/ML IV BOLUS
INTRAVENOUS | Status: AC
  Filled 2021-12-14: qty 20

## 2021-12-14 MED ORDER — DEXAMETHASONE SODIUM PHOSPHATE 10 MG/ML IJ SOLN
INTRAMUSCULAR | Status: DC | PRN
  Administered 2021-12-14: 5 mg

## 2021-12-14 MED ORDER — ONDANSETRON HCL 4 MG PO TABS: 4.0000 mg | ORAL_TABLET | Freq: Four times a day (QID) | ORAL | Status: DC | PRN

## 2021-12-14 MED ORDER — TRAZODONE HCL 50 MG PO TABS
150.0000 mg | ORAL_TABLET | Freq: Every day | ORAL | Status: DC
  Administered 2021-12-14 – 2021-12-15 (×2): 150 mg via ORAL
  Filled 2021-12-14 (×2): qty 3

## 2021-12-14 MED ORDER — KETAMINE HCL 50 MG/5ML IJ SOSY
PREFILLED_SYRINGE | INTRAMUSCULAR | Status: AC
  Filled 2021-12-14: qty 5

## 2021-12-14 MED ORDER — ACETAMINOPHEN 325 MG PO TABS
325.0000 mg | ORAL_TABLET | Freq: Four times a day (QID) | ORAL | Status: DC | PRN
  Administered 2021-12-15 – 2021-12-16 (×2): 650 mg via ORAL
  Filled 2021-12-14 (×2): qty 2

## 2021-12-14 MED ORDER — TRANEXAMIC ACID 1000 MG/10ML IV SOLN
INTRAVENOUS | Status: DC | PRN
  Administered 2021-12-14: 2000 mg via TOPICAL

## 2021-12-14 MED ORDER — POLYETHYLENE GLYCOL 3350 17 G PO PACK: 17.0000 g | PACK | Freq: Every day | ORAL | Status: DC | PRN

## 2021-12-14 MED ORDER — MENTHOL 3 MG MT LOZG: 1.0000 | LOZENGE | OROMUCOSAL | Status: DC | PRN

## 2021-12-14 MED ORDER — FENTANYL CITRATE (PF) 100 MCG/2ML IJ SOLN
INTRAMUSCULAR | Status: AC
  Filled 2021-12-14: qty 2

## 2021-12-14 MED ORDER — FENTANYL CITRATE (PF) 250 MCG/5ML IJ SOLN
INTRAMUSCULAR | Status: DC | PRN
  Administered 2021-12-14: 100 ug via INTRAVENOUS
  Administered 2021-12-14 (×2): 50 ug via INTRAVENOUS
  Administered 2021-12-14: 100 ug via INTRAVENOUS

## 2021-12-14 MED ORDER — CHLORHEXIDINE GLUCONATE 0.12 % MT SOLN
OROMUCOSAL | Status: AC
  Administered 2021-12-14: 15 mL
  Filled 2021-12-14: qty 15

## 2021-12-14 MED ORDER — BICTEGRAVIR-EMTRICITAB-TENOFOV 50-200-25 MG PO TABS
1.0000 | ORAL_TABLET | Freq: Every day | ORAL | Status: DC
  Administered 2021-12-15 – 2021-12-16 (×2): 1 via ORAL
  Filled 2021-12-14 (×3): qty 1

## 2021-12-14 MED ORDER — FENTANYL CITRATE (PF) 100 MCG/2ML IJ SOLN
25.0000 ug | INTRAMUSCULAR | Status: DC | PRN
  Administered 2021-12-14 (×2): 50 ug via INTRAVENOUS

## 2021-12-14 MED ORDER — TRANEXAMIC ACID-NACL 1000-0.7 MG/100ML-% IV SOLN
1000.0000 mg | INTRAVENOUS | Status: AC
  Administered 2021-12-14: 1000 mg via INTRAVENOUS
  Filled 2021-12-14: qty 100

## 2021-12-14 MED ORDER — AMISULPRIDE (ANTIEMETIC) 5 MG/2ML IV SOLN: 10.0000 mg | Freq: Once | INTRAVENOUS | Status: DC | PRN

## 2021-12-14 MED ORDER — METOCLOPRAMIDE HCL 5 MG PO TABS: 5.0000 mg | ORAL_TABLET | Freq: Three times a day (TID) | ORAL | Status: DC | PRN

## 2021-12-14 MED ORDER — SODIUM CHLORIDE 0.9 % IR SOLN
Status: DC | PRN
  Administered 2021-12-14: 3000 mL

## 2021-12-14 MED ORDER — PROMETHAZINE HCL 25 MG/ML IJ SOLN: 6.2500 mg | INTRAMUSCULAR | Status: DC | PRN

## 2021-12-14 MED ORDER — ROPIVACAINE HCL 7.5 MG/ML IJ SOLN
INTRAMUSCULAR | Status: DC | PRN
  Administered 2021-12-14: 20 mL via PERINEURAL

## 2021-12-14 MED ORDER — CHLORHEXIDINE GLUCONATE 0.12 % MT SOLN: 15.0000 mL | Freq: Once | OROMUCOSAL | Status: AC

## 2021-12-14 MED ORDER — SODIUM CHLORIDE 0.9 % IV SOLN: INTRAVENOUS | Status: DC

## 2021-12-14 MED ORDER — LIDOCAINE 2% (20 MG/ML) 5 ML SYRINGE
INTRAMUSCULAR | Status: DC | PRN
  Administered 2021-12-14: 60 mg via INTRAVENOUS

## 2021-12-14 MED ORDER — LACTATED RINGERS IV SOLN: INTRAVENOUS | Status: DC | PRN

## 2021-12-14 MED ORDER — ONDANSETRON HCL 4 MG/2ML IJ SOLN: 4.0000 mg | Freq: Four times a day (QID) | INTRAMUSCULAR | Status: DC | PRN

## 2021-12-14 MED ORDER — CEFAZOLIN SODIUM-DEXTROSE 1-4 GM/50ML-% IV SOLN
1.0000 g | Freq: Four times a day (QID) | INTRAVENOUS | Status: AC
  Administered 2021-12-14 (×2): 1 g via INTRAVENOUS
  Filled 2021-12-14 (×2): qty 50

## 2021-12-14 MED ORDER — GABAPENTIN 300 MG PO CAPS
600.0000 mg | ORAL_CAPSULE | Freq: Three times a day (TID) | ORAL | Status: DC
  Administered 2021-12-14 – 2021-12-16 (×6): 600 mg via ORAL
  Filled 2021-12-14 (×6): qty 2

## 2021-12-14 MED ORDER — FENTANYL CITRATE (PF) 250 MCG/5ML IJ SOLN
INTRAMUSCULAR | Status: AC
  Filled 2021-12-14: qty 5

## 2021-12-14 MED ORDER — CEFAZOLIN SODIUM-DEXTROSE 2-4 GM/100ML-% IV SOLN
2.0000 g | INTRAVENOUS | Status: AC
  Administered 2021-12-14: 2 g via INTRAVENOUS
  Filled 2021-12-14: qty 100

## 2021-12-14 MED ORDER — METOCLOPRAMIDE HCL 5 MG/ML IJ SOLN: 5.0000 mg | Freq: Three times a day (TID) | INTRAMUSCULAR | Status: DC | PRN

## 2021-12-14 MED ORDER — LACTATED RINGERS IV SOLN: INTRAVENOUS | Status: DC

## 2021-12-14 MED ORDER — PHENYLEPHRINE HCL-NACL 20-0.9 MG/250ML-% IV SOLN
INTRAVENOUS | Status: DC | PRN
  Administered 2021-12-14: 30 ug/min via INTRAVENOUS

## 2021-12-14 MED ORDER — OXYCODONE HCL 5 MG PO TABS
5.0000 mg | ORAL_TABLET | ORAL | Status: DC | PRN
  Administered 2021-12-15: 10 mg via ORAL
  Filled 2021-12-14 (×2): qty 2

## 2021-12-14 MED ORDER — MAGNESIUM CITRATE PO SOLN: 1.0000 | Freq: Once | ORAL | Status: DC | PRN

## 2021-12-14 MED ORDER — HYDROMORPHONE HCL 1 MG/ML IJ SOLN
0.5000 mg | INTRAMUSCULAR | Status: DC | PRN
  Administered 2021-12-14 (×2): 0.5 mg via INTRAVENOUS

## 2021-12-14 MED ORDER — BISACODYL 10 MG RE SUPP: 10.0000 mg | Freq: Every day | RECTAL | Status: DC | PRN

## 2021-12-14 MED ORDER — OXYCODONE HCL 5 MG PO TABS
10.0000 mg | ORAL_TABLET | ORAL | Status: DC | PRN
  Administered 2021-12-14 (×2): 10 mg via ORAL
  Administered 2021-12-15 – 2021-12-16 (×3): 15 mg via ORAL
  Administered 2021-12-16 (×2): 10 mg via ORAL
  Filled 2021-12-14: qty 2
  Filled 2021-12-14 (×2): qty 3
  Filled 2021-12-14: qty 2
  Filled 2021-12-14 (×2): qty 3

## 2021-12-14 MED ORDER — ROSUVASTATIN CALCIUM 20 MG PO TABS
20.0000 mg | ORAL_TABLET | Freq: Every day | ORAL | Status: DC
  Administered 2021-12-15 – 2021-12-16 (×2): 20 mg via ORAL
  Filled 2021-12-14 (×2): qty 1

## 2021-12-14 MED ORDER — QUETIAPINE FUMARATE 50 MG PO TABS
150.0000 mg | ORAL_TABLET | Freq: Every day | ORAL | Status: DC
  Administered 2021-12-14 – 2021-12-15 (×2): 150 mg via ORAL
  Filled 2021-12-14 (×3): qty 3

## 2021-12-14 MED ORDER — ASPIRIN 325 MG PO TBEC
325.0000 mg | DELAYED_RELEASE_TABLET | Freq: Every day | ORAL | Status: DC
  Administered 2021-12-15 – 2021-12-16 (×2): 325 mg via ORAL
  Filled 2021-12-14 (×2): qty 1

## 2021-12-14 MED ORDER — HYDROXYZINE HCL 25 MG PO TABS: 25.0000 mg | ORAL_TABLET | Freq: Three times a day (TID) | ORAL | Status: DC | PRN

## 2021-12-14 MED ORDER — HYDROMORPHONE HCL 1 MG/ML IJ SOLN
0.5000 mg | INTRAMUSCULAR | Status: DC | PRN
  Administered 2021-12-15 (×2): 1 mg via INTRAVENOUS
  Filled 2021-12-14 (×3): qty 1

## 2021-12-14 MED ORDER — 0.9 % SODIUM CHLORIDE (POUR BTL) OPTIME
TOPICAL | Status: DC | PRN
  Administered 2021-12-14: 1000 mL

## 2021-12-14 MED ORDER — KETAMINE HCL 10 MG/ML IJ SOLN
INTRAMUSCULAR | Status: DC | PRN
  Administered 2021-12-14: 30 mg via INTRAVENOUS
  Administered 2021-12-14: 10 mg via INTRAVENOUS

## 2021-12-14 MED ORDER — PHENOL 1.4 % MT LIQD: 1.0000 | OROMUCOSAL | Status: DC | PRN

## 2021-12-14 MED ORDER — MIDAZOLAM HCL 2 MG/2ML IJ SOLN
INTRAMUSCULAR | Status: DC | PRN
  Administered 2021-12-14: 2 mg via INTRAVENOUS

## 2021-12-14 MED ORDER — HYDROMORPHONE HCL 1 MG/ML IJ SOLN
INTRAMUSCULAR | Status: AC
  Administered 2021-12-14: 1 mg
  Filled 2021-12-14: qty 1

## 2021-12-14 MED ORDER — DIPHENHYDRAMINE HCL 50 MG/ML IJ SOLN
50.0000 mg | Freq: Once | INTRAMUSCULAR | Status: AC
  Administered 2021-12-14: 50 mg via INTRAVENOUS

## 2021-12-14 MED ORDER — ORAL CARE MOUTH RINSE: 15.0000 mL | Freq: Once | OROMUCOSAL | Status: AC

## 2021-12-14 MED ORDER — PROPOFOL 10 MG/ML IV BOLUS
INTRAVENOUS | Status: DC | PRN
  Administered 2021-12-14: 200 mg via INTRAVENOUS

## 2021-12-14 MED ORDER — GLYCOPYRROLATE PF 0.2 MG/ML IJ SOSY
PREFILLED_SYRINGE | INTRAMUSCULAR | Status: DC | PRN
  Administered 2021-12-14: .1 mg via INTRAVENOUS

## 2021-12-14 MED ORDER — MIDAZOLAM HCL 2 MG/2ML IJ SOLN
INTRAMUSCULAR | Status: AC
  Filled 2021-12-14: qty 2

## 2021-12-14 SURGICAL SUPPLY — 60 items
BAG COUNTER SPONGE SURGICOUNT (BAG) ×1 IMPLANT
BAG SPNG CNTER NS LX DISP (BAG) ×1
BLADE SAG 18X100X1.27 (BLADE) IMPLANT
BLADE SAW SAG HD 89.5X25X.94 (BLADE) IMPLANT
BLADE SAW SGTL 73X25 THK (BLADE) IMPLANT
BLADE SURG 21 STRL SS (BLADE) ×2 IMPLANT
BNDG COHESIVE 6X5 TAN NS LF (GAUZE/BANDAGES/DRESSINGS) IMPLANT
BNDG COHESIVE 6X5 TAN STRL LF (GAUZE/BANDAGES/DRESSINGS) ×2 IMPLANT
BNDG GAUZE DERMACEA FLUFF 4 (GAUZE/BANDAGES/DRESSINGS) IMPLANT
BNDG GZE DERMACEA 4 6PLY (GAUZE/BANDAGES/DRESSINGS) ×1
BOWL SMART MIX CTS (DISPOSABLE) ×1 IMPLANT
BSPLAT TIB G 2 PG STRL KN LT (Knees) ×1 IMPLANT
COMP FEMORAL SZ 10 COBALT LEFT (Joint) ×1 IMPLANT
COMP PATELLAR 10X35 METAL (Joint) ×1 IMPLANT
COMP TIB PORS SZ G LT (Knees) ×1 IMPLANT
COMPONENT FEMRL SZ10 COBALT LT (Joint) IMPLANT
COMPONENT PATELLAR 10X35 METAL (Joint) IMPLANT
COMPONET TIB PORS SZ G LT (Knees) IMPLANT
COOLER ICEMAN CLASSIC (MISCELLANEOUS) ×1 IMPLANT
COVER SURGICAL LIGHT HANDLE (MISCELLANEOUS) ×1 IMPLANT
CUFF TOURN SGL QUICK 34 (TOURNIQUET CUFF) ×1
CUFF TOURN SGL QUICK 42 (TOURNIQUET CUFF) IMPLANT
CUFF TRNQT CYL 34X4.125X (TOURNIQUET CUFF) ×1 IMPLANT
DRAPE EXTREMITY T 121X128X90 (DISPOSABLE) ×1 IMPLANT
DRAPE HALF SHEET 40X57 (DRAPES) ×2 IMPLANT
DRAPE U-SHAPE 47X51 STRL (DRAPES) ×1 IMPLANT
DRSG ADAPTIC 3X8 NADH LF (GAUZE/BANDAGES/DRESSINGS) IMPLANT
DURAPREP 26ML APPLICATOR (WOUND CARE) ×1 IMPLANT
ELECT REM PT RETURN 9FT ADLT (ELECTROSURGICAL) ×1
ELECTRODE REM PT RTRN 9FT ADLT (ELECTROSURGICAL) ×1 IMPLANT
FACESHIELD WRAPAROUND (MASK) ×1 IMPLANT
FACESHIELD WRAPAROUND OR TEAM (MASK) ×1 IMPLANT
GAUZE PAD ABD 8X10 STRL (GAUZE/BANDAGES/DRESSINGS) ×1 IMPLANT
GAUZE SPONGE 4X4 12PLY STRL (GAUZE/BANDAGES/DRESSINGS) IMPLANT
GLOVE SURG ORTHO 9.0 STRL STRW (GLOVE) ×1 IMPLANT
GOWN STRL REUS W/ TWL XL LVL3 (GOWN DISPOSABLE) ×2 IMPLANT
GOWN STRL REUS W/TWL XL LVL3 (GOWN DISPOSABLE) ×2
HANDPIECE INTERPULSE COAX TIP (DISPOSABLE) ×1
HDLS TROCR DRIL PIN KNEE 75 (PIN) ×1
INSERT ARTISURF S8-11 18X22X14 (Insert) IMPLANT
KIT BASIN OR (CUSTOM PROCEDURE TRAY) ×1 IMPLANT
KIT TURNOVER KIT B (KITS) ×1 IMPLANT
MANIFOLD NEPTUNE II (INSTRUMENTS) ×1 IMPLANT
NS IRRIG 1000ML POUR BTL (IV SOLUTION) ×1 IMPLANT
PACK TOTAL JOINT (CUSTOM PROCEDURE TRAY) ×1 IMPLANT
PAD ABD 8X10 STRL (GAUZE/BANDAGES/DRESSINGS) IMPLANT
PAD ARMBOARD 7.5X6 YLW CONV (MISCELLANEOUS) ×1 IMPLANT
PIN DRILL HDLS TROCAR 75 4PK (PIN) IMPLANT
SCREW FEMALE HEX FIX 25X2.5 (ORTHOPEDIC DISPOSABLE SUPPLIES) IMPLANT
SET HNDPC FAN SPRY TIP SCT (DISPOSABLE) ×1 IMPLANT
STAPLER VISISTAT 35W (STAPLE) ×1 IMPLANT
SUCTION FRAZIER HANDLE 10FR (MISCELLANEOUS)
SUCTION TUBE FRAZIER 10FR DISP (MISCELLANEOUS) IMPLANT
SUT VIC AB 0 CT1 27 (SUTURE) ×2
SUT VIC AB 0 CT1 27XBRD ANBCTR (SUTURE) ×1 IMPLANT
SUT VIC AB 1 CTX 27 (SUTURE) IMPLANT
SUT VIC AB 1 CTX 36 (SUTURE)
SUT VIC AB 1 CTX36XBRD ANBCTR (SUTURE) IMPLANT
TOWEL GREEN STERILE (TOWEL DISPOSABLE) ×1 IMPLANT
TOWEL GREEN STERILE FF (TOWEL DISPOSABLE) ×1 IMPLANT

## 2021-12-14 NOTE — Discharge Instructions (Signed)
INSTRUCTIONS AFTER JOINT REPLACEMENT  ° °Remove items at home which could result in a fall. This includes throw rugs or furniture in walking pathways °ICE to the affected joint every three hours while awake for 30 minutes at a time, for at least the first 3-5 days, and then as needed for pain and swelling.  Continue to use ice for pain and swelling. You may notice swelling that will progress down to the foot and ankle.  This is normal after surgery.  Elevate your leg when you are not up walking on it.   °Continue to use the breathing machine you got in the hospital (incentive spirometer) which will help keep your temperature down.  It is common for your temperature to cycle up and down following surgery, especially at night when you are not up moving around and exerting yourself.  The breathing machine keeps your lungs expanded and your temperature down. ° ° °DIET:  As you were doing prior to hospitalization, we recommend a well-balanced diet. ° °DRESSING / WOUND CARE / SHOWERING ° °You may change your dressing 3-5 days after surgery.  Then change the dressing every day with sterile gauze.  Please use good hand washing techniques before changing the dressing.  Do not use any lotions or creams on the incision until instructed by your surgeon. ° °ACTIVITY ° °Increase activity slowly as tolerated, but follow the weight bearing instructions below.   °No driving for 6 weeks or until further direction given by your physician.  You cannot drive while taking narcotics.  °No lifting or carrying greater than 10 lbs. until further directed by your surgeon. °Avoid periods of inactivity such as sitting longer than an hour when not asleep. This helps prevent blood clots.  °You may return to work once you are authorized by your doctor.  ° ° ° °WEIGHT BEARING  ° °Weight bearing as tolerated with assist device (walker, cane, etc) as directed, use it as long as suggested by your surgeon or therapist, typically at least 4-6  weeks. ° ° °EXERCISES ° °Results after joint replacement surgery are often greatly improved when you follow the exercise, range of motion and muscle strengthening exercises prescribed by your doctor. Safety measures are also important to protect the joint from further injury. Any time any of these exercises cause you to have increased pain or swelling, decrease what you are doing until you are comfortable again and then slowly increase them. If you have problems or questions, call your caregiver or physical therapist for advice.  ° °Rehabilitation is important following a joint replacement. After just a few days of immobilization, the muscles of the leg can become weakened and shrink (atrophy).  These exercises are designed to build up the tone and strength of the thigh and leg muscles and to improve motion. Often times heat used for twenty to thirty minutes before working out will loosen up your tissues and help with improving the range of motion but do not use heat for the first two weeks following surgery (sometimes heat can increase post-operative swelling).  ° °These exercises can be done on a training (exercise) mat, on the floor, on a table or on a bed. Use whatever works the best and is most comfortable for you.    Use music or television while you are exercising so that the exercises are a pleasant break in your day. This will make your life better with the exercises acting as a break in your routine that you can look forward   to.   Perform all exercises about fifteen times, three times per day or as directed.  You should exercise both the operative leg and the other leg as well. ° °Exercises include: °  °Quad Sets - Tighten up the muscle on the front of the thigh (Quad) and hold for 5-10 seconds.   °Straight Leg Raises - With your knee straight (if you were given a brace, keep it on), lift the leg to 60 degrees, hold for 3 seconds, and slowly lower the leg.  Perform this exercise against resistance later as  your leg gets stronger.  °Leg Slides: Lying on your back, slowly slide your foot toward your buttocks, bending your knee up off the floor (only go as far as is comfortable). Then slowly slide your foot back down until your leg is flat on the floor again.  °Angel Wings: Lying on your back spread your legs to the side as far apart as you can without causing discomfort.  °Hamstring Strength:  Lying on your back, push your heel against the floor with your leg straight by tightening up the muscles of your buttocks.  Repeat, but this time bend your knee to a comfortable angle, and push your heel against the floor.  You may put a pillow under the heel to make it more comfortable if necessary.  ° °A rehabilitation program following joint replacement surgery can speed recovery and prevent re-injury in the future due to weakened muscles. Contact your doctor or a physical therapist for more information on knee rehabilitation.  ° ° °CONSTIPATION ° °Constipation is defined medically as fewer than three stools per week and severe constipation as less than one stool per week.  Even if you have a regular bowel pattern at home, your normal regimen is likely to be disrupted due to multiple reasons following surgery.  Combination of anesthesia, postoperative narcotics, change in appetite and fluid intake all can affect your bowels.  ° °YOU MUST use at least one of the following options; they are listed in order of increasing strength to get the job done.  They are all available over the counter, and you may need to use some, POSSIBLY even all of these options:   ° °Drink plenty of fluids (prune juice may be helpful) and high fiber foods °Colace 100 mg by mouth twice a day  °Senokot for constipation as directed and as needed Dulcolax (bisacodyl), take with full glass of water  °Miralax (polyethylene glycol) once or twice a day as needed. ° °If you have tried all these things and are unable to have a bowel movement in the first 3-4 days  after surgery call either your surgeon or your primary doctor.   ° °If you experience loose stools or diarrhea, hold the medications until you stool forms back up.  If your symptoms do not get better within 1 week or if they get worse, check with your doctor.  If you experience "the worst abdominal pain ever" or develop nausea or vomiting, please contact the office immediately for further recommendations for treatment. ° ° °ITCHING:  If you experience itching with your medications, try taking only a single pain pill, or even half a pain pill at a time.  You can also use Benadryl over the counter for itching or also to help with sleep.  ° °TED HOSE STOCKINGS:  Use stockings on both legs until for at least 2 weeks or as directed by physician office. They may be removed at night for sleeping. ° °  MEDICATIONS:  See your medication summary on the “After Visit Summary” that nursing will review with you.  You may have some home medications which will be placed on hold until you complete the course of blood thinner medication.  It is important for you to complete the blood thinner medication as prescribed. ° °PRECAUTIONS:  If you experience chest pain or shortness of breath - call 911 immediately for transfer to the hospital emergency department.  ° °If you develop a fever greater that 101 F, purulent drainage from wound, increased redness or drainage from wound, foul odor from the wound/dressing, or calf pain - CONTACT YOUR SURGEON.   °                                                °FOLLOW-UP APPOINTMENTS:  If you do not already have a post-op appointment, please call the office for an appointment to be seen by your surgeon.  Guidelines for how soon to be seen are listed in your “After Visit Summary”, but are typically between 1-4 weeks after surgery. ° °OTHER INSTRUCTIONS:  ° °Knee Replacement:  Do not place pillow under knee, focus on keeping the knee straight while resting. CPM instructions: 0-90 degrees, 2 hours in the  morning, 2 hours in the afternoon, and 2 hours in the evening. Place foam block, curve side up under heel at all times except when in CPM or when walking.  DO NOT modify, tear, cut, or change the foam block in any way. ° °POST-OPERATIVE OPIOID TAPER INSTRUCTIONS: °It is important to wean off of your opioid medication as soon as possible. If you do not need pain medication after your surgery it is ok to stop day one. °Opioids include: °Codeine, Hydrocodone(Norco, Vicodin), Oxycodone(Percocet, oxycontin) and hydromorphone amongst others.  °Long term and even short term use of opiods can cause: °Increased pain response °Dependence °Constipation °Depression °Respiratory depression °And more.  °Withdrawal symptoms can include °Flu like symptoms °Nausea, vomiting °And more °Techniques to manage these symptoms °Hydrate well °Eat regular healthy meals °Stay active °Use relaxation techniques(deep breathing, meditating, yoga) °Do Not substitute Alcohol to help with tapering °If you have been on opioids for less than two weeks and do not have pain than it is ok to stop all together.  °Plan to wean off of opioids °This plan should start within one week post op of your joint replacement. °Maintain the same interval or time between taking each dose and first decrease the dose.  °Cut the total daily intake of opioids by one tablet each day °Next start to increase the time between doses. °The last dose that should be eliminated is the evening dose.  ° °MAKE SURE YOU:  °Understand these instructions.  °Get help right away if you are not doing well or get worse.  ° ° °Thank you for letting us be a part of your medical care team.  It is a privilege we respect greatly.  We hope these instructions will help you stay on track for a fast and full recovery!  ° ° ° ° °

## 2021-12-14 NOTE — Transfer of Care (Signed)
Immediate Anesthesia Transfer of Care Note  Patient: Dylan [name]  Procedure(s) Performed: LEFT TOTAL KNEE ARTHROPLASTY (Left: Knee)  Patient Location: PACU  Anesthesia Type:General  Level of Consciousness: drowsy and patient cooperative  Airway & Oxygen Therapy: Patient Spontanous Breathing  Post-op Assessment: Report given to RN, Post -op Vital signs reviewed and stable, and Patient moving all extremities X 4  Post vital signs: Reviewed and stable  Last Vitals:  Vitals Value Taken Time  BP 156/100 12/14/21 1015  Temp    Pulse 86 12/14/21 1016  Resp 13 12/14/21 1016  SpO2 97 % 12/14/21 1016  Vitals shown include unvalidated device data.  Last Pain:  Vitals:   12/14/21 0739  TempSrc:   PainSc: 8       Patients Stated Pain Goal: 3 (29/47/65 4650)  Complications: No notable events documented.

## 2021-12-14 NOTE — Anesthesia Procedure Notes (Signed)
Anesthesia Regional Block: Adductor canal block   Pre-Anesthetic Checklist: , timeout performed,  Correct Patient, Correct Site, Correct Laterality,  Correct Procedure, Correct Position, site marked,  Risks and benefits discussed,  Surgical consent,  Pre-op evaluation,  At surgeon's request and post-op pain management  Laterality: Left  Prep: chloraprep       Needles:  Injection technique: Single-shot  Needle Type: Stimulator Needle - 80     Needle Length: 10cm  Needle Gauge: 21     Additional Needles:   Narrative:  Start time: 12/14/2021 7:52 AM End time: 12/14/2021 8:02 AM Injection made incrementally with aspirations every 5 mL.  Performed by: Personally  Anesthesiologist: Duane Boston, MD

## 2021-12-14 NOTE — Evaluation (Signed)
Physical Therapy Evaluation Patient Details Name: Dylan [name] MRN: [id number] DOB: Oct 03, 1965 Today's Date: 12/14/2021  History of Present Illness  Pt is 56 yo male admitted 12/14/21 for L TKA.  Pt with hx including but not limited to drug OD 11/26/21, substance abuse (cocaine, methamphetamine, EtOH), bipolar, schizoaffective disorder, chronic fatigue, Lisfranc dislocation R with arthrodesis 09/17/19; suicidal ideations, DDD, and arthritis  Clinical Impression  Pt is s/p TKA resulting in the deficits listed below (see PT Problem List). At baseline, pt active and independent. Pt seen for evaluation on POD #0.  He was able to complete bed mobility with light min A for L LE and min guard for standing and ambulation 60' with RW.  Pt had limited knee flexion due to pain, but pain control was good with mobility. Pt expected to progress well with therapy. Pt will benefit from skilled PT to increase their independence and safety with mobility to allow discharge to the venue listed below.         Recommendations for follow up therapy are one component of a multi-disciplinary discharge planning process, led by the attending physician.  Recommendations may be updated based on patient status, additional functional criteria and insurance authorization.  Follow Up Recommendations Follow physician's recommendations for discharge plan and follow up therapies      Assistance Recommended at Discharge Intermittent Supervision/Assistance  Patient can return home with the following  A little help with walking and/or transfers;A little help with bathing/dressing/bathroom;Assistance with cooking/housework;Help with stairs or ramp for entrance    Equipment Recommendations Rolling walker (2 wheels)  Recommendations for Other Services       Functional Status Assessment Patient has had a recent decline in their functional status and demonstrates the ability to make significant improvements in function in a reasonable  and predictable amount of time.     Precautions / Restrictions Precautions Precautions: Fall Restrictions Weight Bearing Restrictions: Yes LLE Weight Bearing: Weight bearing as tolerated      Mobility  Bed Mobility Overal bed mobility: Needs Assistance Bed Mobility: Supine to Sit, Sit to Supine     Supine to sit: Min assist Sit to supine: Min assist   General bed mobility comments: light min A for L LE    Transfers Overall transfer level: Needs assistance Equipment used: Rolling walker (2 wheels) Transfers: Sit to/from Stand Sit to Stand: Min guard           General transfer comment: min guard for safety with cues for hand placement and L LE management; able to rise from low surface    Ambulation/Gait Ambulation/Gait assistance: Min guard Gait Distance (Feet): 60 Feet Assistive device: Rolling walker (2 wheels) Gait Pattern/deviations: Step-to pattern, Decreased stride length, Decreased weight shift to left Gait velocity: decreased     General Gait Details: Initial cues for sequencing , RW use and proximity  Science writer    Modified Rankin (Stroke Patients Only)       Balance Overall balance assessment: Needs assistance Sitting-balance support: No upper extremity supported Sitting balance-Leahy Scale: Normal     Standing balance support: Bilateral upper extremity supported, No upper extremity supported Standing balance-Leahy Scale: Fair Standing balance comment: RW to ambulate but could static stand without support                             Pertinent Vitals/Pain Pain Assessment Pain Assessment: 0-10 Pain  Score: 2  Pain Location: L knee Pain Descriptors / Indicators: Discomfort Pain Intervention(s): Limited activity within patient's tolerance, Monitored during session    Home Living Family/patient expects to be discharged to:: Private residence Living Arrangements: Other (Comment)  (roomates) Available Help at Discharge: Friend(s);Available 24 hours/day Type of Home: House Home Access: Level entry (little hill but no steps)       Home Layout: Laundry or work area in basement;Able to live on main level with bedroom/bathroom;Two level Home Equipment: None      Prior Function Prior Level of Function : Independent/Modified Independent;Driving;Working/employed             Mobility Comments: walks without AD;  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)       Hand Dominance        Extremity/Trunk Assessment   Upper Extremity Assessment Upper Extremity Assessment: Overall WFL for tasks assessed    Lower Extremity Assessment Lower Extremity Assessment: LLE deficits/detail;RLE deficits/detail RLE Deficits / Details: Expected post op changes; ROM : ankle and hip WFL, knee 5 to 40 degrees limited by pain; MMT: ankle 5/5, knee and hip 3/5 not further tested LLE Deficits / Details: ROM WFL except limited at ankle from prior fusion; MMT 5/5; strong throughout    Cervical / Trunk Assessment Cervical / Trunk Assessment: Normal  Communication   Communication: No difficulties  Cognition Arousal/Alertness: Awake/alert Behavior During Therapy: WFL for tasks assessed/performed Overall Cognitive Status: Within Functional Limits for tasks assessed                                          General Comments General comments (skin integrity, edema, etc.): VSS    Exercises     Assessment/Plan    PT Assessment Patient needs continued PT services  PT Problem List Decreased strength;Pain;Decreased range of motion;Decreased activity tolerance;Decreased knowledge of use of DME;Decreased balance;Decreased mobility;Decreased knowledge of precautions       PT Treatment Interventions DME instruction;Gait training;Therapeutic exercise;Balance training;Stair training;Functional mobility training;Therapeutic activities;Patient/family  education;Modalities    PT Goals (Current goals can be found in the Care Plan section)  Acute Rehab PT Goals Patient Stated Goal: return home; 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 PT Goal Formulation: With patient Time For Goal Achievement: 12/28/21 Potential to Achieve Goals: Good    Frequency 7X/week     Co-evaluation               AM-PAC PT "6 Clicks" Mobility  Outcome Measure Help needed turning from your back to your side while in a flat bed without using bedrails?: None Help needed moving from lying on your back to sitting on the side of a flat bed without using bedrails?: A Little Help needed moving to and from a bed to a chair (including a wheelchair)?: A Little Help needed standing up from a chair using your arms (e.g., wheelchair or bedside chair)?: A Little Help needed to walk in hospital room?: A Little Help needed climbing 3-5 steps with a railing? : A Little 6 Click Score: 19    End of Session Equipment Utilized During Treatment: Gait belt Activity Tolerance: Patient tolerated treatment well Patient left: in bed;with call bell/phone within reach;with bed alarm set (no SCD in room; ice cuff resumed) Nurse Communication: Mobility status PT Visit Diagnosis: Other abnormalities of gait and mobility (R26.89);Muscle weakness (generalized) (M62.81)  Time: 5583-1674 PT Time Calculation (min) (ACUTE ONLY): 27 min   Charges:   PT Evaluation $PT Eval Low Complexity: 1 Low PT Treatments $Gait Training: 8-22 mins        Abran Richard, PT Acute Rehab [facility] 12/14/2021, 2:22 PM

## 2021-12-14 NOTE — H&P (Addendum)
TOTAL KNEE ADMISSION H&P  Patient is being admitted for left total knee arthroplasty.  Subjective:  Chief Complaint:left knee pain.  HPI: Dylan [name], 56 y.o. male, has a history of pain and functional disability in the left knee due to arthritis and has failed non-surgical conservative treatments for greater than 12 weeks to includeNSAID's and/or analgesics, corticosteriod injections, supervised PT with diminished ADL's post treatment, use of assistive devices, weight reduction as appropriate, and activity modification.  Onset of symptoms was gradual, starting 8 years ago with gradually worsening course since that time. The patient noted no past surgery on the left knee(s).  Patient currently rates pain in the left knee(s) at 8 out of 10 with activity. Patient has night pain, worsening of pain with activity and weight bearing, pain that interferes with activities of daily living, pain with passive range of motion, crepitus, and joint swelling.  Patient has evidence of subchondral cysts, subchondral sclerosis, periarticular osteophytes, joint subluxation, and joint space narrowing by imaging studies. This patient has had avascular necrosis of the knee. There is no active infection.  Patient Active Problem List   Diagnosis Date Noted   Intentional drug overdose ([place]) 11/26/2021   Chronic fatigue 11/16/2021   Abnormal EKG 11/16/2021   Overweight (BMI 25.0-29.9) 11/16/2021   Transaminitis 07/03/2021   Right foot ulcer ([place]) 07/03/2021   Onychomycosis 04/19/2021   Cocaine abuse ([place]) 03/16/2021   Bipolar I disorder, most recent episode depressed ([place]) 03/15/2021   Bipolar 1 disorder, depressed ([place]) 03/15/2021   Nodular radiologic density 02/02/2021   Lesion of liver 02/02/2021   Liver disease 02/02/2021   Vitamin D deficiency 01/18/2021   Alcohol abuse 01/17/2021   Methamphetamine abuse ([place]) 01/17/2021   Lesion of pancreas 01/17/2021   Substance induced mood disorder ([place]) 10/26/2020    Fatty liver 03/09/2020   Schizophrenia ([place]) 03/08/2020   Generalized anxiety disorder 02/03/2020   Insomnia due to other mental disorder 02/03/2020   Lisfranc dislocation, right, initial encounter    Prediabetes 07/22/2019   Tobacco abuse 07/10/2018   Schizoaffective disorder, bipolar type ([place])    Acute renal failure (ARF) ([place])    Suicidal ideations    Chronic 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 coma ([place])    AKI (acute kidney injury) ([place]) 02/19/2017   Rhabdomyolysis 02/19/2017   Suicide attempt by acetaminophen overdose ([place]) 02/19/2017   Acute hepatitis 02/19/2017   Elevated LFTs    Schizoaffective disorder, depressive type ([place]) 09/05/2011   Degenerative disc disease 08/21/2011   HIV disease ([place]) 08/17/2011   Past Medical History:  Diagnosis Date   Anxiety    Arthritis    Bipolar 1 disorder ([place])    Colon polyps    Depression    GERD (gastroesophageal reflux disease)    Hepatitis B    Human immunodeficiency virus (HIV) ([place])    Hyperlipidemia    Hypertension    Neuromuscular disorder ([place])    neuropathy   Neuropathy    Pre-diabetes    Schizophrenia ([place])     Past Surgical History:  Procedure Laterality Date   FOOT ARTHRODESIS Right 09/17/2019   Procedure: FUSION RIGHT LISFRANC JOINT;  Surgeon: Newt Minion, MD;  Location: [facility];  Service: Orthopedics;  Laterality: Right;   FOOT ARTHRODESIS Right 09/2019   HEMORROIDECTOMY     IR FLUORO GUIDE CV LINE RIGHT  02/22/2017   IR US GUIDE VASC ACCESS RIGHT  02/22/2017   TUMOR REMOVAL     From Chest  Current Facility-Administered Medications  Medication Dose Route Frequency Provider Last Rate Last Admin   tranexamic acid (CYKLOKAPRON) 2,000 mg in sodium chloride 0.9 % 50 mL Topical Application  9,470 mg Topical To OR Newt Minion, MD       Current Outpatient Medications  Medication Sig Dispense Refill Last Dose   ALEVE 220 MG tablet Take 220-440 mg by mouth 2 (two) times daily as needed (for pain).       bictegravir-emtricitabine-tenofovir AF (BIKTARVY) 50-200-25 MG TABS tablet Take 1 tablet by mouth daily. 30 tablet 11    diclofenac Sodium (VOLTAREN) 1 % GEL Apply 2-4 g topically 3 (three) times daily as needed (right foot and ankle pain).      gabapentin (NEURONTIN) 300 MG capsule Take 2 capsules (600 mg total) by mouth 3 (three) times daily. For pain 180 capsule 0    hydrOXYzine (ATARAX) 25 MG tablet Take 1 tablet (25 mg total) by mouth 3 (three) times daily as needed for anxiety. 75 tablet 0    QUEtiapine Fumarate 150 MG TABS Take 150 mg by mouth at bedtime. For mood control 30 tablet 0    rosuvastatin (CRESTOR) 20 MG tablet Take 1 tablet (20 mg total) by mouth daily. 90 tablet 3    traZODone (DESYREL) 150 MG tablet Take 1 tablet (150 mg total) by mouth at bedtime. For sleep (Patient taking differently: Take 150 mg by mouth at bedtime as needed for sleep.) 30 tablet 0    triamcinolone ointment (KENALOG) 0.1 % Apply topically 2 (two) times daily. For fungal growth to right toes. (Patient not taking: Reported on 11/26/2021) 30 g 0    Allergies  Allergen Reactions   Penicillins Other (See Comments)    Childhood allergy   Pork-Derived Products Other (See Comments)    Patient preference   Latex Rash   Tape Rash    Social History   Tobacco Use   Smoking status: Every Day    Packs/day: 0.50    Years: 25.00    Total pack years: 12.50    Types: E-cigarettes, Cigarettes   Smokeless tobacco: Never   Tobacco comments:    Vapes daily  Substance Use Topics   Alcohol use: Yes    Comment: occassional drinker    Family History  Problem Relation Age of Onset   CAD Mother    Diabetes Father    Stroke Father    Colon cancer Maternal Aunt 60   Diabetes Maternal Aunt    Heart disease Maternal Uncle    Heart attack Maternal Uncle    Esophageal cancer Neg Hx    Rectal cancer Neg Hx    Stomach cancer Neg Hx      Review of Systems  All other systems reviewed and are  negative.   Objective:  Physical Exam  Vital signs in last 24 hours:    Labs:   Estimated body mass index is 30.04 kg/m as calculated from the following:   Height as of 12/12/21: 6\' 2"  (1.88 m).   Weight as of 12/12/21: 106.1 kg.   Imaging Review Plain radiographs demonstrate moderate degenerative joint disease of the left knee(s). The overall alignment ismild varus. The bone quality appears to be adequate for age and reported activity level.      Assessment/Plan:  End stage arthritis, left knee   The patient history, physical examination, clinical judgment of the provider and imaging studies are consistent with end stage degenerative joint disease of the left knee(s) and total knee  arthroplasty is deemed medically necessary. The treatment options including medical management, injection therapy arthroscopy and arthroplasty were discussed at length. The risks and benefits of total knee arthroplasty were presented and reviewed. The risks due to aseptic loosening, infection, stiffness, patella tracking problems, thromboembolic complications and other imponderables were discussed. The patient acknowledged the explanation, agreed to proceed with the plan and consent was signed. Patient is being admitted for inpatient treatment for surgery, pain control, PT, OT, prophylactic antibiotics, VTE prophylaxis, progressive ambulation and ADL's and discharge planning. The patient is planning to be discharged home with home health services     Patient's anticipated LOS is less than 2 midnights, meeting these requirements: - Younger than 48 - Lives within 1 hour of care - Has a competent adult at home to recover with post-op recover - NO history of  - Chronic pain requiring opiods  - Diabetes  - Coronary Artery Disease  - Heart failure  - Heart attack  - Stroke  - DVT/VTE  - Cardiac arrhythmia  - Respiratory Failure/COPD  - Renal failure  - Anemia  - Advanced Liver disease

## 2021-12-14 NOTE — Anesthesia Postprocedure Evaluation (Signed)
Anesthesia Post Note  Patient: Dylan [name]  Procedure(s) Performed: LEFT TOTAL KNEE ARTHROPLASTY (Left: Knee)     Patient location during evaluation: PACU Anesthesia Type: General Level of consciousness: sedated Pain management: pain level controlled Vital Signs Assessment: post-procedure vital signs reviewed and stable Respiratory status: spontaneous breathing and respiratory function stable Cardiovascular status: stable Postop Assessment: no apparent nausea or vomiting Anesthetic complications: no   No notable events documented.  Last Vitals:  Vitals:   12/14/21 1145 12/14/21 1200  BP: 133/86 136/85  Pulse: 82 69  Resp: 10 12  Temp:    SpO2: 97% 100%    Last Pain:  Vitals:   12/14/21 1200  TempSrc:   PainSc: 0-No pain                 Hendrix Yurkovich DANIEL

## 2021-12-14 NOTE — Op Note (Signed)
DATE OF SURGERY:  12/14/2021  TIME: 10:09 AM  PATIENT NAME:  Dylan [name]    AGE: 56 y.o.    PRE-OPERATIVE DIAGNOSIS:  Osteoarthritis Left Knee  POST-OPERATIVE DIAGNOSIS:  Osteoarthritis Left Knee  PROCEDURE:  Procedure(s): LEFT TOTAL KNEE ARTHROPLASTY  SURGEON: Meridee Score  ASSISTANT: 10  OPERATIVE IMPLANTS: Zimmer Persona  Femur size 10, Tibia size G  2- Peg fixed bearing, Patella size 35 1-peg oval button, with a 10 mm polyethylene insert medial congruent.  @ENCIMAGES @    PREOPERATIVE INDICATIONS:   Dylan [name] is a 56 y.o. year old male with end stage degenerative arthritis of the knee who failed conservative treatment and elected for Total Knee Arthroplasty.   The risks, benefits, and alternatives were discussed at length including but not limited to the risks of infection, bleeding, nerve injury, stiffness, blood clots, the need for revision surgery, cardiopulmonary complications, among others, and they were willing to proceed.  OPERATIVE DESCRIPTION:  The patient was brought to the operative room and placed in a supine position.  Anesthesia was administered.  IV antibiotics were given.  IV TXA was initiated.  The lower extremity was prepped and draped in the usual sterile fashion.  Charlie Pitter was used to cover all exposed skin. Time out was performed.    Anterior quadriceps tendon splitting approach was performed.  The patella was everted and osteophytes were removed.  The anterior horn of the medial and lateral meniscus was removed.   The distal femur was opened with the drill and the intramedullary distal femoral cutting jig was utilized, set at 5 degrees valgus resecting 9 mm off the distal femur.  Care was taken to protect the collateral ligaments.  Then the extramedullary tibial cutting jig was utilized set for 3 degree posterior slope.  Care was taken during the cut to protect the medial and collateral ligaments.  The proximal tibia was removed along with the  posterior horns of the menisci.  The PCL was sacrificed.    The extensor gap was measured and was approximately 10 mm.    The distal femoral sizing jig was applied, taking care to avoid notching.  Then the 4-in-1 cutting jig was applied and the anterior and posterior femur was cut, along with the chamfer cuts.  All posterior osteophytes were removed.  The flexion gap was then measured and was symmetric with the extension gap.  The distal femoral preparation using the appropriate jig to prepare the box.  The patella was then measured, and cut with the saw.    The proximal tibia sized and prepared accordingly with the reamer and the punch, and then all components were trialed with the poly insert.  The knee was found to have stable balance and full motion.  The knee was irrigated with normal saline, topical 2 g TXA was used to soak the wound.  The above named components were then press fit into place.  The final polyethylene component was placed.  The knee was then taken through a range of motion and the patella tracked well and the knee irrigated copiously and the parapatellar and subcutaneous tissue closed with vicryl, and skin closed with staples..  A sterile dressing was applied and patient  was taken to the PACU in stable  condition.  There were no complications.  Total tourniquet time was 10 minutes.

## 2021-12-14 NOTE — Anesthesia Procedure Notes (Signed)
Procedure Name: LMA Insertion Date/Time: 12/14/2021 8:43 AM  Performed by: Darletta Moll, CRNAPre-anesthesia Checklist: Patient identified, Emergency Drugs available, Suction available and Patient being monitored Patient Re-evaluated:Patient Re-evaluated prior to induction Oxygen Delivery Method: Circle system utilized Preoxygenation: Pre-oxygenation with 100% oxygen Induction Type: IV induction Ventilation: Mask ventilation without difficulty LMA: LMA inserted LMA Size: 5.0 Number of attempts: 1 Placement Confirmation: positive ETCO2, breath sounds checked- equal and bilateral and CO2 detector Tube secured with: Tape Dental Injury: Teeth and Oropharynx as per pre-operative assessment

## 2021-12-14 NOTE — Interval H&P Note (Signed)
History and Physical Interval Note:  12/14/2021 7:59 AM  Dylan [name]  has presented today for surgery, with the diagnosis of Osteoarthritis Left Knee.  The various methods of treatment have been discussed with the patient and family. After consideration of risks, benefits and other options for treatment, the patient has consented to  Procedure(s): LEFT TOTAL KNEE ARTHROPLASTY (Left) as a surgical intervention.  The patient's history has been reviewed, patient examined, no change in status, stable for surgery.  I have reviewed the patient's chart and labs.  Questions were answered to the patient's satisfaction.     Newt Minion

## 2021-12-15 DIAGNOSIS — M1712 Unilateral primary osteoarthritis, left knee: Secondary | ICD-10-CM | POA: Diagnosis not present

## 2021-12-15 MED ORDER — OXYCODONE-ACETAMINOPHEN 5-325 MG PO TABS: 1.0000 | ORAL_TABLET | ORAL | 0 refills | Status: DC | PRN

## 2021-12-15 MED ORDER — FAMOTIDINE 20 MG PO TABS
20.0000 mg | ORAL_TABLET | Freq: Once | ORAL | Status: DC
  Filled 2021-12-15: qty 1

## 2021-12-15 NOTE — Discharge Summary (Signed)
Discharge Diagnoses:  Principal Problem:   S/P total knee arthroplasty, left Active Problems:   Unilateral primary osteoarthritis, left knee   Total knee replacement status, left   Surgeries: Procedure(s): LEFT TOTAL KNEE ARTHROPLASTY on 12/14/2021    Consultants:   Discharged Condition: Improved  Hospital Course: Dylan [name] is an 56 y.o. male who was admitted 12/14/2021 with a chief complaint of osteoarthritis left knee, with a final diagnosis of Osteoarthritis Left Knee.  Patient was brought to the operating room on 12/14/2021 and underwent Procedure(s): LEFT TOTAL KNEE ARTHROPLASTY.    Patient was given perioperative antibiotics:  Anti-infectives (From admission, onward)    Start     Dose/Rate Route Frequency Ordered Stop   12/14/21 1400  bictegravir-emtricitabine-tenofovir AF (BIKTARVY) 50-200-25 MG per tablet 1 tablet        1 tablet Oral Daily 12/14/21 1311     12/14/21 1400  ceFAZolin (ANCEF) IVPB 1 g/50 mL premix        1 g 100 mL/hr over 30 Minutes Intravenous Every 6 hours 12/14/21 1311 12/14/21 2123   12/14/21 0730  ceFAZolin (ANCEF) IVPB 2g/100 mL premix        2 g 200 mL/hr over 30 Minutes Intravenous On call to O.R. 12/14/21 9983 12/14/21 3825     .  Patient was given sequential compression devices, early ambulation, and aspirin for DVT prophylaxis.  Recent vital signs: Patient Vitals for the past 24 hrs:  BP Temp Temp src Pulse Resp SpO2  12/15/21 0812 125/79 (!) 97.5 F (36.4 C) -- 76 16 98 %  12/15/21 0400 126/78 (!) 97.3 F (36.3 C) Oral 86 19 97 %  12/14/21 2339 117/76 98 F (36.7 C) Oral 83 18 97 %  12/14/21 1945 (!) 148/84 97.8 F (36.6 C) Oral 100 18 100 %  12/14/21 1259 (!) 143/91 97.6 F (36.4 C) Oral 69 16 100 %  12/14/21 1245 113/84 97.8 F (36.6 C) -- 70 14 95 %  12/14/21 1230 118/83 -- -- 81 10 100 %  12/14/21 1215 117/83 -- -- 67 14 100 %  12/14/21 1200 136/85 -- -- 69 12 100 %  12/14/21 1145 133/86 -- -- 82 10 97 %  12/14/21 1130  129/84 -- -- 77 12 99 %  12/14/21 1115 -- -- -- -- 18 --  12/14/21 1100 (!) 133/106 -- -- 93 15 93 %  12/14/21 1045 (!) 133/95 -- -- 85 18 92 %  12/14/21 1030 (!) 133/96 -- -- 86 17 95 %  12/14/21 1015 (!) 156/100 -- -- 91 19 91 %  12/14/21 1010 (!) 156/100 97.6 F (36.4 C) -- 91 19 94 %  12/14/21 0824 -- -- -- 68 (!) 9 94 %  12/14/21 0823 -- -- -- 69 14 95 %  12/14/21 0822 -- -- -- 63 13 95 %  12/14/21 0821 -- -- -- 73 10 95 %  12/14/21 0820 115/69 -- -- 65 11 (!) 89 %  12/14/21 0819 -- -- -- 68 10 93 %  12/14/21 0818 -- -- -- 68 11 94 %  12/14/21 0817 -- -- -- 71 (!) 8 93 %  12/14/21 0816 -- -- -- 70 (!) 9 94 %  .  Recent laboratory studies: No results found.  Discharge Medications:   Allergies as of 12/15/2021       Reactions   Penicillins Other (See Comments)   Childhood allergy   Pork-derived Products Other (See Comments)   Patient preference   Latex Rash  Tape Rash        Medication List     TAKE these medications    Aleve 220 MG tablet Generic drug: naproxen sodium Take 220-440 mg by mouth 2 (two) times daily as needed (for pain).   Biktarvy 50-200-25 MG Tabs tablet Generic drug: bictegravir-emtricitabine-tenofovir AF Take 1 tablet by mouth daily.   gabapentin 300 MG capsule Commonly known as: NEURONTIN Take 2 capsules (600 mg total) by mouth 3 (three) times daily. For pain   hydrOXYzine 25 MG tablet Commonly known as: ATARAX Take 1 tablet (25 mg total) by mouth 3 (three) times daily as needed for anxiety.   oxyCODONE-acetaminophen 5-325 MG tablet Commonly known as: PERCOCET/ROXICET Take 1 tablet by mouth every 4 (four) hours as needed.   QUEtiapine Fumarate 150 MG Tabs Take 150 mg by mouth at bedtime. For mood control   rosuvastatin 20 MG tablet Commonly known as: CRESTOR Take 1 tablet (20 mg total) by mouth daily.   traZODone 150 MG tablet Commonly known as: DESYREL Take 1 tablet (150 mg total) by mouth at bedtime. For sleep What changed:   when to take this reasons to take this additional instructions   triamcinolone ointment 0.1 % Commonly known as: KENALOG Apply topically 2 (two) times daily. For fungal growth to right toes.   Voltaren 1 % Gel Generic drug: diclofenac Sodium Apply 2-4 g topically 3 (three) times daily as needed (right foot and ankle pain).               Discharge Care Instructions  (From admission, onward)           Start     Ordered   12/15/21 0000  Weight bearing as tolerated       Question Answer Comment  Laterality left   Extremity Lower      12/15/21 0815            Diagnostic Studies: No results found.  Patient benefited maximally from their hospital stay and there were no complications.     Disposition: Discharge disposition: 01-Home or Self Care      Discharge Instructions     Call MD / Call 911   Complete by: As directed    If you experience chest pain or shortness of breath, CALL 911 and be transported to the hospital emergency room.  If you develope a fever above 101 F, pus (white drainage) or increased drainage or redness at the wound, or calf pain, call your surgeon's office.   Constipation Prevention   Complete by: As directed    Drink plenty of fluids.  Prune juice may be helpful.  You may use a stool softener, such as Colace (over the counter) 100 mg twice a day.  Use MiraLax (over the counter) for constipation as needed.   Diet - low sodium heart healthy   Complete by: As directed    Increase activity slowly as tolerated   Complete by: As directed    Post-operative opioid taper instructions:   Complete by: As directed    POST-OPERATIVE OPIOID TAPER INSTRUCTIONS: It is important to wean off of your opioid medication as soon as possible. If you do not need pain medication after your surgery it is ok to stop day one. Opioids include: Codeine, Hydrocodone(Norco, Vicodin), Oxycodone(Percocet, oxycontin) and hydromorphone amongst others.  Long term  and even short term use of opiods can cause: Increased pain response Dependence Constipation Depression Respiratory depression And more.  Withdrawal symptoms can include Flu like  symptoms Nausea, vomiting And more Techniques to manage these symptoms Hydrate well Eat regular healthy meals Stay active Use relaxation techniques(deep breathing, meditating, yoga) Do Not substitute Alcohol to help with tapering If you have been on opioids for less than two weeks and do not have pain than it is ok to stop all together.  Plan to wean off of opioids This plan should start within one week post op of your joint replacement. Maintain the same interval or time between taking each dose and first decrease the dose.  Cut the total daily intake of opioids by one tablet each day Next start to increase the time between doses. The last dose that should be eliminated is the evening dose.      Weight bearing as tolerated   Complete by: As directed    Laterality: left   Extremity: Lower       Follow-up Information     Newt Minion, MD Follow up in 1 week(s).   Specialty: Orthopedic Surgery Contact information: [address] [place] [address] [phone]                  Signed: Newt Minion 12/15/2021, 8:15 AM

## 2021-12-15 NOTE — Progress Notes (Signed)
Physical Therapy Treatment Patient Details Name: Dylan [name] MRN: [id number] DOB: July 27, 1965 Today's Date: 12/15/2021   History of Present Illness Pt is 56 yo male admitted 12/14/21 for L TKA.  Pt with hx including but not limited to drug OD 11/26/21, substance abuse (cocaine, methamphetamine, EtOH), bipolar, schizoaffective disorder, chronic fatigue, Lisfranc dislocation R with arthrodesis 09/17/19; suicidal ideations, DDD, and arthritis.    PT Comments    Pt received in supine, c/o severe LLE pain after premedication ~90 mins prior to session. RN called to room to give gabapentin and pt reports improved pain after exercises/mobilization and pain meds given. Pt needing up to min guard for all functional mobility tasks, including stair negotiation, gait and transfers. Pt with some drainage noted to outer wrappings of dressing and reports posterior dressing area causing discomfort, RN notified pt may need new dressing applied. Pt asking for # to OP PT office so he can call to arrange his transport ~3 days prior to appointments. Anticipate pt will be safe to DC in AM from mobility perspective once medically cleared if pain controlled. Pt continues to benefit from PT services to progress toward functional mobility goals.   Recommendations for follow up therapy are one component of a multi-disciplinary discharge planning process, led by the attending physician.  Recommendations may be updated based on patient status, additional functional criteria and insurance authorization.  Follow Up Recommendations  Follow physician's recommendations for discharge plan and follow up therapies     Assistance Recommended at Discharge Intermittent Supervision/Assistance  Patient can return home with the following A little help with walking and/or transfers;A little help with bathing/dressing/bathroom;Assistance with cooking/housework;Help with stairs or ramp for entrance;Assist for transportation   Equipment  Recommendations  Rolling walker (2 wheels)    Recommendations for Other Services       Precautions / Restrictions Precautions Precautions: Fall Restrictions Weight Bearing Restrictions: Yes LLE Weight Bearing: Weight bearing as tolerated     Mobility  Bed Mobility Overal bed mobility: Needs Assistance Bed Mobility: Supine to Sit, Sit to Supine     Supine to sit: Min guard, HOB elevated     General bed mobility comments: min guard with pt using gait belt as strap for leg lowering. Pt reliant on bed rails and HOB elevated. Increased time/effort to perform.    Transfers Overall transfer level: Needs assistance Equipment used: Rolling walker (2 wheels) Transfers: Sit to/from Stand Sit to Stand: Min guard, From elevated surface           General transfer comment: min guard to stand from bed<>RW, min cues for safe UE/LE placement due to pain.    Ambulation/Gait Ambulation/Gait assistance: Min guard, Supervision Gait Distance (Feet): 200 Feet (including standing breaks) Assistive device: Rolling walker (2 wheels) Gait Pattern/deviations: Step-to pattern, Decreased stride length, Decreased weight shift to left, Step-through pattern, Antalgic, Shuffle, Knee flexed in stance - left Gait velocity: grossly <0.3 m/s     General Gait Details: Initial cues for sequencing , RW use and proximity, then cues for step through as pain improved within tolerance. No LOB in PM session but does need some reminders for proper proximity to RW.   Stairs Stairs: Yes Stairs assistance: Min guard Stair Management: One rail Right, Step to pattern, Forwards, One rail Left Number of Stairs: 3 General stair comments: pt ascended/descended single step in stairwell x3 reps with R rail and L RW handle to simulate second railing; pt has curving flight of steps into his basement for laundry but reports  he will wait on this until he can practice more with OPPT.   Wheelchair Mobility    Modified  [place] (Stroke Patients Only)       Balance Overall balance assessment: Needs assistance Sitting-balance support: No upper extremity supported Sitting balance-Leahy Scale: Normal     Standing balance support: Bilateral upper extremity supported, No upper extremity supported Standing balance-Leahy Scale: Fair Standing balance comment: RW due to LLE pain, needs at least U UE support                            Cognition Arousal/Alertness: Awake/alert Behavior During Therapy: WFL for tasks assessed/performed Overall Cognitive Status: Within Functional Limits for tasks assessed                                 General Comments: Good recall of sequencing/instructions given from AM to PM session. 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        Exercises Total Joint Exercises Ankle Circles/Pumps: AROM, Both, 20 reps, Supine Quad Sets: AROM, Both, 10 reps, Supine Short Arc Quad: AROM, Left, 10 reps, Supine Heel Slides: AROM, AAROM, Left, Supine, 5 reps (AA for improved ROM, pt also using gait belt to self-assist at times) Hip ABduction/ADduction: AROM, Left, 10 reps, Supine Straight Leg Raises: AROM, AAROM, Left, 5 reps, Supine Long Arc Quad: AROM, AAROM, Left, 10 reps, Seated Knee Flexion: AROM, Left, 10 reps, Seated, AAROM (pt instructed on using RLE over front of LLE to assist with bending) Goniometric ROM: L knee flexion ROM 8 deg to 82 deg seated EOB    General Comments General comments (skin integrity, edema, etc.): VSS on RA at rest; no acute s/sx distress so did not assess standing BP      Pertinent Vitals/Pain Pain Assessment Pain Assessment: 0-10 Pain Score: 8  Pain Location: L knee, improves after exercises, with gait it decreased to 5/10 (also had gabapentin just prior to gait) Pain Descriptors / Indicators: Discomfort, Grimacing, Moaning, Operative site guarding Pain  Intervention(s): Monitored during session, Premedicated before session, Repositioned, RN gave pain meds during session, Ice applied     PT Goals (current goals can now be found in the care plan section) Acute Rehab PT Goals Patient Stated Goal: return home; 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 PT Goal Formulation: With patient Time For Goal Achievement: 12/28/21 Progress towards PT goals: Progressing toward goals    Frequency    7X/week      PT Plan Current plan remains appropriate       AM-PAC PT "6 Clicks" Mobility   Outcome Measure  Help needed turning from your back to your side while in a flat bed without using bedrails?: None Help needed moving from lying on your back to sitting on the side of a flat bed without using bedrails?: A Little Help needed moving to and from a bed to a chair (including a wheelchair)?: A Little Help needed standing up from a chair using your arms (e.g., wheelchair or bedside chair)?: A Little Help needed to walk in hospital room?: A Little Help needed climbing 3-5 steps with a railing? : A Little 6 Click Score: 19    End of Session Equipment Utilized During Treatment: Gait belt Activity Tolerance: Patient tolerated treatment well;Patient limited by pain Patient left: with call bell/phone  within reach;Other (comment);in bed;with bed alarm set (iceman donned, bone foam under L ankle) Nurse Communication: Mobility status;Other (comment);Precautions (pt dressing starting to have drainage visible on outer wrapping in multiple areas and pt c/o increased posterior knee pain, wrappings appear bunched in that location may need dressing replaced. Pt asking about a shower, needs to wait until new drsg) PT Visit Diagnosis: Other abnormalities of gait and mobility (R26.89);Muscle weakness (generalized) (M62.81)     Time: 5643-3295 PT Time Calculation (min) (ACUTE ONLY): 38 min  Charges:  $Gait Training: 8-22 mins $Therapeutic Exercise: 8-22  mins $Therapeutic Activity: 8-22 mins                     Murriel Holwerda P., PTA Acute Rehabilitation Services Secure Chat Preferred 9a-5:30pm Office: [place] 12/15/2021, 4:57 PM

## 2021-12-15 NOTE — TOC Initial Note (Signed)
Transition of Care [facility]) - Initial/Assessment Note    Patient Details  Name: Dylan [name] MRN: [id number] Date of Birth: [birth year]  Transition of Care [facility]) CM/SW Contact:    Dylan Chars, LCSW Phone Number: 12/15/2021, 8:47 AM  Clinical Narrative:   CSW met with pt for initial assessment and SDOH transportation.  Pt from home with roommate, had questions about walker: would like rollator if possible rather than rolling walker.  Permission given to speak with brother Dylan [name].    CSW discussed transportation needs.  Pt reports he is set up with [employer] transportation through his medicaid.  They brought him to the hospital and he can call them for transportation home today and also for rides to any medical appts.  CSW asked him to call them about ride home today at DC.                  Expected Discharge Plan: Home/Self Care Barriers to Discharge: No Barriers Identified   Patient Goals and CMS Choice Patient states their goals for this hospitalization and ongoing recovery are:: walking again by January 8 when school restarts      Expected Discharge Plan and Services Expected Discharge Plan: Home/Self Care In-house Referral: Clinical Social Work     Living arrangements for the past 2 months: Single Family Home Expected Discharge Date: 12/15/21                                    Prior Living Arrangements/Services Living arrangements for the past 2 months: Single Family Home Lives with:: Roommate Patient language and need for interpreter reviewed:: Yes Do you feel safe going back to the place where you live?: Yes      Need for Family Participation in Patient Care: No (Comment) Care giver support system in place?: Yes (comment) Current home services: Other (comment) (none) Criminal Activity/Legal Involvement Pertinent to Current Situation/Hospitalization: No - Comment as needed  Activities of Daily Living Home Assistive Devices/Equipment: None ADL  Screening (condition at time of admission) Patient's cognitive ability adequate to safely complete daily activities?: No Is the patient deaf or have difficulty hearing?: No Does the patient have difficulty seeing, even when wearing glasses/contacts?: No Does the patient have difficulty concentrating, remembering, or making decisions?: No Patient able to express need for assistance with ADLs?: Yes Does the patient have difficulty dressing or bathing?: No Independently performs ADLs?: Yes (appropriate for developmental age) Does the patient have difficulty walking or climbing stairs?: Yes Weakness of Legs: Left Weakness of Arms/Hands: None  Permission Sought/Granted Permission sought to share information with : Family Supports Permission granted to share information with : Yes, Verbal Permission Granted  Share Information with NAME: brother Dylan [name]           Emotional Assessment Appearance:: Appears stated age Attitude/Demeanor/Rapport: Engaged Affect (typically observed): Appropriate, Pleasant Orientation: : Oriented to Self, Oriented to Place, Oriented to  Time, Oriented to Situation      Admission diagnosis:  S/P total knee arthroplasty, left [Z96.652] Total knee replacement status, left [Z96.652] Patient Active Problem List   Diagnosis Date Noted   S/P total knee arthroplasty, left 12/14/2021   Unilateral primary osteoarthritis, left knee 12/14/2021   Total knee replacement status, left 12/14/2021   Intentional drug overdose ([place]) 11/26/2021   Chronic fatigue 11/16/2021   Abnormal EKG 11/16/2021   Overweight (BMI 25.0-29.9) 11/16/2021   Transaminitis 07/03/2021   Right foot  ulcer ([place]) 07/03/2021   Onychomycosis 04/19/2021   Cocaine abuse ([place]) 03/16/2021   Bipolar I disorder, most recent episode depressed ([place]) 03/15/2021   Bipolar 1 disorder, depressed ([place]) 03/15/2021   Nodular radiologic density 02/02/2021   Lesion of liver 02/02/2021   Liver disease 02/02/2021    Vitamin D deficiency 01/18/2021   Alcohol abuse 01/17/2021   Methamphetamine abuse ([place]) 01/17/2021   Lesion of pancreas 01/17/2021   Substance induced mood disorder ([place]) 10/26/2020   Fatty liver 03/09/2020   Schizophrenia ([place]) 03/08/2020   Generalized anxiety disorder 02/03/2020   Insomnia due to other mental disorder 02/03/2020   Lisfranc dislocation, right, initial encounter    Prediabetes 07/22/2019   Tobacco abuse 07/10/2018   Schizoaffective disorder, bipolar type ([place])    Acute renal failure (ARF) ([place])    Suicidal ideations    Chronic 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 coma ([place])    AKI (acute kidney injury) ([place]) 02/19/2017   Rhabdomyolysis 02/19/2017   Suicide attempt by acetaminophen overdose ([place]) 02/19/2017   Acute hepatitis 02/19/2017   Elevated LFTs    Schizoaffective disorder, depressive type ([place]) 09/05/2011   Degenerative disc disease 08/21/2011   HIV disease ([place]) 08/17/2011   PCP:  Dylan Rakes, MD Pharmacy:   [id number] [id number]: [phone]: [phone]    Social Determinants of Health (SDOH) Interventions    Readmission Risk Interventions     No data to display

## 2021-12-15 NOTE — Progress Notes (Signed)
Patient ID: Dylan [name], male   DOB: 05/12/1965, 56 y.o.   MRN: [id number] Patient is postoperative day 1 total knee arthroplasty on the left.  Patient has been out of bed ambulating yesterday.  Plan for continued physical therapy today with discharge to home after therapy today with home health physical therapy.

## 2021-12-15 NOTE — Progress Notes (Signed)
Physical Therapy Treatment Patient Details Name: Dylan [name] MRN: [id number] DOB: 1965/12/12 Today's Date: 12/15/2021   History of Present Illness Pt is 56 yo male admitted 12/14/21 for L TKA.  Pt with hx including but not limited to drug OD 11/26/21, substance abuse (cocaine, methamphetamine, EtOH), bipolar, schizoaffective disorder, chronic fatigue, Lisfranc dislocation R with arthrodesis 09/17/19; suicidal ideations, DDD, and arthritis.    PT Comments    Pt received in supine, agreeable to therapy session and with good participation, pt limited due to severe L knee pain with exercises and transfers. Pt needed IV pain meds during AM session and would benefit from second session in PM for curb step/stair training and to ensure pt pain controlled, he was premedicated with PO meds prior to session but pain was still 8-10/10 with knee flexion/extension. Increased time needed to perform all tasks due to pain, encouraged him to notify staff when [place] doesn't feel cold so they can refill it so pain behind his knee can be better controlled. Pt continues to benefit from PT services to progress toward functional mobility goals.    Recommendations for follow up therapy are one component of a multi-disciplinary discharge planning process, led by the attending physician.  Recommendations may be updated based on patient status, additional functional criteria and insurance authorization.  Follow Up Recommendations  Follow physician's recommendations for discharge plan and follow up therapies     Assistance Recommended at Discharge Intermittent Supervision/Assistance  Patient can return home with the following A little help with walking and/or transfers;A little help with bathing/dressing/bathroom;Assistance with cooking/housework;Help with stairs or ramp for entrance;Assist for transportation   Equipment Recommendations  Rolling walker (2 wheels)    Recommendations for Other Services       Precautions  / Restrictions Precautions Precautions: Fall Precaution Comments: severe L knee pain Restrictions Weight Bearing Restrictions: Yes LLE Weight Bearing: Weight bearing as tolerated     Mobility  Bed Mobility Overal bed mobility: Needs Assistance Bed Mobility: Supine to Sit, Sit to Supine     Supine to sit: Min guard     General bed mobility comments: min guard with pt using gait belt as strap for leg lowering. Pt reliant on bed rails.    Transfers Overall transfer level: Needs assistance Equipment used: Rolling walker (2 wheels) Transfers: Sit to/from Stand Sit to Stand: Min guard, Min assist, From elevated surface           General transfer comment: min guard to stand from elevated height and minA for safety with cues for hand placement and L LE management; able to rise from low surface but needs minA for lowering assist due to pain    Ambulation/Gait Ambulation/Gait assistance: Min guard Gait Distance (Feet): 100 Feet Assistive device: Rolling walker (2 wheels) Gait Pattern/deviations: Step-to pattern, Decreased stride length, Decreased weight shift to left, Step-through pattern, Antalgic, Shuffle, Knee flexed in stance - left Gait velocity: decreased     General Gait Details: Initial cues for sequencing , RW use and proximity, then cues for step through as pain improved within tolerance. Pt needing consistent min cues throughout for improved technique and mild LOB x2 but pt able to self-correct with RW support   Stairs Stairs:  (pt defers due to pain)           Wheelchair Mobility    Modified Rankin (Stroke Patients Only)       Balance Overall balance assessment: Needs assistance Sitting-balance support: No upper extremity supported Sitting balance-Leahy Scale: Normal  Standing balance support: Bilateral upper extremity supported, No upper extremity supported Standing balance-Leahy Scale: Fair Standing balance comment: RW due to LLE pain, needs  at least U UE support                            Cognition Arousal/Alertness: Awake/alert Behavior During Therapy: WFL for tasks assessed/performed Overall Cognitive Status: Within Functional Limits for tasks assessed                                          Exercises Total Joint Exercises Ankle Circles/Pumps: AROM, Both, 20 reps, Supine Quad Sets: AROM, Both, 10 reps, Supine Short Arc Quad: AROM, Left, 10 reps, Supine Heel Slides: AROM, AAROM, Left, 10 reps, Supine (AA for improved ROM, pt also using gait belt to self-assist at times) Hip ABduction/ADduction: AROM, Left, 10 reps, Supine Straight Leg Raises: AROM, AAROM, Left, 5 reps, Supine Long Arc Quad: AROM, AAROM, Left, 10 reps, Seated Knee Flexion: AROM, Left, 10 reps, Seated Goniometric ROM: L knee flexion ROM 12 deg to 80 deg seated EOB    General Comments General comments (skin integrity, edema, etc.): HR 115-120 bpm with exertional tasks; SpO2 92% on RA (cold finger reading may be inaccurate)      Pertinent Vitals/Pain Pain Assessment Pain Assessment: 0-10 Pain Score: 10-Worst pain ever Pain Location: L knee, worse posterior area Pain Descriptors / Indicators: Discomfort, Grimacing, Moaning, Operative site guarding Pain Intervention(s): Monitored during session, Premedicated before session, Repositioned, RN gave pain meds during session, Ice applied    Home Living                          Prior Function            PT Goals (current goals can now be found in the care plan section) Acute Rehab PT Goals Patient Stated Goal: return home; 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 PT Goal Formulation: With patient Time For Goal Achievement: 12/28/21 Progress towards PT goals: Progressing toward goals    Frequency    7X/week      PT Plan Current plan remains appropriate    Co-evaluation              AM-PAC PT "6 Clicks" Mobility   Outcome Measure  Help  needed turning from your back to your side while in a flat bed without using bedrails?: None Help needed moving from lying on your back to sitting on the side of a flat bed without using bedrails?: A Little Help needed moving to and from a bed to a chair (including a wheelchair)?: A Little Help needed standing up from a chair using your arms (e.g., wheelchair or bedside chair)?: A Little Help needed to walk in hospital room?: A Little Help needed climbing 3-5 steps with a railing? : A Lot 6 Click Score: 18    End of Session Equipment Utilized During Treatment: Gait belt Activity Tolerance: Patient tolerated treatment well;Patient limited by pain Patient left: with call bell/phone within reach;in chair;Other (comment) (iceman donned, bone foam under L ankle) Nurse Communication: Mobility status;Other (comment) (pt needs second session due to pain) PT Visit Diagnosis: Other abnormalities of gait and mobility (R26.89);Muscle weakness (generalized) (M62.81)     Time: 2536-6440 PT Time Calculation (min) (ACUTE ONLY): 58 min  Charges:  $  Gait Training: 23-37 mins $Therapeutic Exercise: 23-37 mins                     Dinah Lupa P., PTA Acute Rehabilitation Services Secure Chat Preferred 9a-5:30pm Office: [place] 12/15/2021, 11:40 AM

## 2021-12-16 ENCOUNTER — Telehealth: Payer: Self-pay | Admitting: Orthopedic Surgery

## 2021-12-16 ENCOUNTER — Other Ambulatory Visit: Payer: Self-pay | Admitting: Orthopedic Surgery

## 2021-12-16 ENCOUNTER — Encounter (HOSPITAL_COMMUNITY): Payer: Self-pay | Admitting: Orthopedic Surgery

## 2021-12-16 DIAGNOSIS — M1712 Unilateral primary osteoarthritis, left knee: Secondary | ICD-10-CM | POA: Diagnosis not present

## 2021-12-16 DIAGNOSIS — Z96652 Presence of left artificial knee joint: Secondary | ICD-10-CM

## 2021-12-16 NOTE — Progress Notes (Signed)
CSW met with pt regarding SDOH food and transportation.  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  He has an app on his phone that assists with identifying options.  Pt is also involved with [facility] project and has a case manager there, they are in process of applying for [place] transportation.  Pt reports he does have a vehicle, cannot drive with current health conditions.  He had medicaid transportation form that he needed CSW to fill out, which was done.  Pt reports his transportation needs are also being addressed with help from [place]. Lurline Idol, MSW, LCSW 12/8/20231:22 PM

## 2021-12-16 NOTE — Plan of Care (Signed)
  Problem: Pain Management: Goal: Pain level will decrease with appropriate interventions Outcome: Progressing   Problem: Education: Goal: Knowledge of General Education information will improve Description: Including pain rating scale, medication(s)/side effects and non-pharmacologic comfort measures Outcome: Progressing   Problem: Activity: Goal: Risk for activity intolerance will decrease Outcome: Progressing   Problem: Pain Managment: Goal: General experience of comfort will improve Outcome: Progressing

## 2021-12-16 NOTE — Progress Notes (Signed)
Physical Therapy Treatment Patient Details Name: Dylan [name] MRN: [id number] DOB: [birth year] Today's Date: 12/16/2021   History of Present Illness Pt is 56 yo male admitted 12/14/21 for L TKA.  Pt with hx including but not limited to drug OD 11/26/21, substance abuse (cocaine, methamphetamine, EtOH), bipolar, schizoaffective disorder, chronic fatigue, Lisfranc dislocation R with arthrodesis 09/17/19; suicidal ideations, DDD, and arthritis.    PT Comments    Pt received in supine, c/o severe L knee pain despite premedication, agreeable to supine/seated LLE exercises and gait trial with RW. Distance limited due to pain and pt c/o wrappings feeling tight, pt needing mostly min guard with cues for pain relief and pt carrying over cues well. Pt wrappings on L knee dressing loosened at end of session and pt reports immediate relief. Ice applied and frequency reviewed with him for ice. Mobility specialist requested to work with him after L knee dressing re-done to ensure he is mobilizing well prior to DC. Anticipate pt will be safe to DC once medically cleared if pain remains controlled after L knee dressing re-done. Pt continues to benefit from PT services to progress toward functional mobility goals.    Recommendations for follow up therapy are one component of a multi-disciplinary discharge planning process, led by the attending physician.  Recommendations may be updated based on patient status, additional functional criteria and insurance authorization.  Follow Up Recommendations  Follow physician's recommendations for discharge plan and follow up therapies     Assistance Recommended at Discharge Intermittent Supervision/Assistance  Patient can return home with the following A little help with walking and/or transfers;A little help with bathing/dressing/bathroom;Assistance with cooking/housework;Help with stairs or ramp for entrance;Assist for transportation   Equipment Recommendations  Rolling  walker (2 wheels) (BSC and RW already delivered to room)    Recommendations for Other Services       Precautions / Restrictions Precautions Precautions: Fall Restrictions Weight Bearing Restrictions: Yes LLE Weight Bearing: Weight bearing as tolerated     Mobility  Bed Mobility Overal bed mobility: Needs Assistance Bed Mobility: Supine to Sit, Sit to Supine     Supine to sit: Min guard Sit to supine: Min assist   General bed mobility comments: min guard with pt using gait belt as strap for leg lowering. Pt using bed rails intermittently; HOB nearly flat. Increased time/effort to perform due to tight L knee wrappings; minA for return to supine due to increased pain but pt reports relief after L knee dressing loosened and pt scooting/moving better in bed at that time    Transfers Overall transfer level: Needs assistance Equipment used: Rolling walker (2 wheels) Transfers: Sit to/from Stand Sit to Stand: Min guard           General transfer comment: min guard to stand from bed<>RW, min cues for safe UE/LE placement due to pain, pt stands from slightly elevated bed height to simulate home set-up    Ambulation/Gait Ambulation/Gait assistance: Min guard, Supervision Gait Distance (Feet): 28 Feet Assistive device: Rolling walker (2 wheels) Gait Pattern/deviations: Step-to pattern, Decreased stride length, Decreased weight shift to left, Antalgic, Shuffle, Knee flexed in stance - left Gait velocity: grossly <0.3 m/s     General Gait Details: Initial cues for sequencing , RW use and proximity, pt c/o severe continued cramps/pain and c/o tight knee wrapping, pt having to hop the last few feet. Once knee wrapping loosened, pt reports significant pain relief. Pt needs new dressing so RN notified.   Stairs  General stair comments: defer due to pt needing new knee wrapping, he was able to perform [facility] yesterday so not needed to perform in AM session.   Wheelchair  Mobility    Modified Rankin (Stroke Patients Only)       Balance Overall balance assessment: Needs assistance Sitting-balance support: No upper extremity supported Sitting balance-Leahy Scale: Normal     Standing balance support: Bilateral upper extremity supported, No upper extremity supported Standing balance-Leahy Scale: Fair Standing balance comment: RW due to LLE pain, needs at least U UE support                            Cognition Arousal/Alertness: Awake/alert Behavior During Therapy: WFL for tasks assessed/performed Overall Cognitive Status: Within Functional Limits for tasks assessed                                 General Comments: Needs reminders for sore LLE placement to prevent increased pain with transfers and with fair carryover; internally distracted due to pain.        Exercises Total Joint Exercises Ankle Circles/Pumps: AROM, Both, Supine, 10 reps Quad Sets: AROM, Both, Supine, 5 reps Heel Slides: AROM, AAROM, Left, Supine, 5 reps (AA for improved ROM, pt also using gait belt to self-assist at times) Long Arc Quad: AAROM, Left, Seated, 10 reps (cramps limiting ROM, pt c/o tight knee wrapping) Knee Flexion: AROM, Left, 10 reps, Seated (pt instructed on using RLE over front of LLE to assist with bending) Goniometric ROM: L knee flexion ROM 15 deg to 70 deg seated EOB, pain at tight dressing limiting ROM. Defer unwrapping until end of session so it can be re-dressed by RN    General Comments General comments (skin integrity, edema, etc.): no dizziness reported, VSS per chart review in AM      Pertinent Vitals/Pain Pain Assessment Pain Assessment: Faces Faces Pain Scale: Hurts worst Pain Location: L knee and anterior thigh, improves after dressing wrappings loosened at end of session Pain Descriptors / Indicators: Discomfort, Grimacing, Moaning, Operative site guarding, Sharp, Throbbing, Cramping Pain Intervention(s): Limited  activity within patient's tolerance, Monitored during session, Premedicated before session, Repositioned, Patient requesting pain meds-RN notified, Ice applied           PT Goals (current goals can now be found in the care plan section) Acute Rehab PT Goals Patient Stated Goal: return home; 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 PT Goal Formulation: With patient Time For Goal Achievement: 12/28/21 Progress towards PT goals: Progressing toward goals (tight dressing limiting progression)    Frequency    7X/week      PT Plan Current plan remains appropriate       AM-PAC PT "6 Clicks" Mobility   Outcome Measure  Help needed turning from your back to your side while in a flat bed without using bedrails?: None Help needed moving from lying on your back to sitting on the side of a flat bed without using bedrails?: A Little Help needed moving to and from a bed to a chair (including a wheelchair)?: A Little Help needed standing up from a chair using your arms (e.g., wheelchair or bedside chair)?: A Little Help needed to walk in hospital room?: A Little Help needed climbing 3-5 steps with a railing? : A Little 6 Click Score: 19    End of Session Equipment Utilized During  Treatment: Gait belt Activity Tolerance: Patient limited by pain;Other (comment) (pt reports relief once wrapping loosened) Patient left: with call bell/phone within reach;Other (comment);in bed;with bed alarm set (iceman donned, bone foam under L ankle, wrapping on L knee loosened) Nurse Communication: Mobility status;Other (comment);Precautions (pt dressing starting to have drainage visible on outer wrapping in multiple areas and pt c/o increased posterior knee pain, wrappings appear bunched in that location and pt reports relief once wrappings loosened. RN notified this was req also yest. PM) PT Visit Diagnosis: Other abnormalities of gait and mobility (R26.89);Muscle weakness (generalized) (M62.81)      Time: 3735-7897 PT Time Calculation (min) (ACUTE ONLY): 35 min  Charges:  $Gait Training: 8-22 mins $Therapeutic Exercise: 8-22 mins                     Azriella Mattia P., PTA Acute Rehabilitation Services Secure Chat Preferred 9a-5:30pm Office: [place] 12/16/2021, 11:07 AM

## 2021-12-16 NOTE — Progress Notes (Signed)
Mobility Specialist Progress Note   12/16/21 1246  Mobility  Activity Ambulated with assistance in hallway  Level of Assistance Minimal assist, patient does 75% or more  Assistive Device Front wheel walker  Distance Ambulated (ft) 60 ft  LLE Weight Bearing WBAT  Activity Response Tolerated well  $Mobility charge 1 Mobility   Received pt in bed having pain in L knee but rating it a 3/10 after dressing change. Ambulation limited d/t blood perfusing through pt's dressing. Returned back to chair w/o fault and RN present in the room. Pt stating to be feeling fine.     Holland Falling Mobility Specialist Please contact via SecureChat or  Rehab office at [phone]

## 2021-12-16 NOTE — Progress Notes (Signed)
Patient ID: Dylan [name], male   DOB: 14-Oct-1965, 56 y.o.   MRN: [id number] Patient was slow with therapy yesterday.  Plan with progression of therapy today and discharge to home today.  Again discussed the importance of patient working on knee extension.

## 2021-12-16 NOTE — Telephone Encounter (Signed)
I received a call from Sonia Side at [facility] about the referral we sent to them for HHPT for after pt's TKA on 12/14/21. He stated since pt has medicaid they only allow one visit. He stated we need to send him to out patient therapy at this point. I did call pt this morning and told him about this. He was okay with going to out patient rehab. I did let him know that I will put in the order but he would have to call once he is discharged to get scheduled. He exclaimed "well how am I going to do that? I don't know the number." I told him I was going to provide all the info to him. He said he is laying in a hospital bed right now. I then suggested could call him back at another time to give him information. He said no never mind and wrote down the number. He then had said something else that I did not understand and asked him to repeat, he said never mind very rudely and hung up the phone.  Again, I did put in the referral and pt was informed to call to get scheduled.

## 2021-12-16 NOTE — TOC Transition Note (Signed)
Transition of Care [facility]) - CM/SW Discharge Note   Patient Details  Name: Dylan [name] MRN: [id number] Date of Birth: July 14, 1965  [facility] CM/SW Contact:  Verdell Carmine, RN Phone Number: 12/16/2021, 9:38 AM   Clinical Narrative:    Patient to be DC today, all DME ordered from [place] yesterday. Office to set up outpatient therapy.     Barriers to Discharge: No Barriers Identified   Patient Goals and CMS Choice Patient states their goals for this hospitalization and ongoing recovery are:: walking again by January 8 when school restarts   Choice offered to / list presented to : Patient  Discharge Placement             DC home with OP therapy          Discharge Plan and Services In-house Referral: Clinical Social Work Discharge Planning Services: CM Consult            DME Arranged: 3-N-1 DME Agency: [employer] Date DME Agency Contacted: 12/15/21 Time DME Agency Contacted: [phone] Representative spoke with at DME Agency: Brenton Grills            Social Determinants of Health ([place]) Interventions Transportation Interventions: Intervention Not Indicated, Inpatient TOC   Readmission Risk Interventions     No data to display

## 2021-12-16 NOTE — Discharge Summary (Signed)
Discharge Diagnoses:  Principal Problem:   S/P total knee arthroplasty, left Active Problems:   Unilateral primary osteoarthritis, left knee   Total knee replacement status, left   Surgeries: Procedure(s): LEFT TOTAL KNEE ARTHROPLASTY on 12/14/2021    Consultants:   Discharged Condition: Improved  Hospital Course: Dylan [name] is an 56 y.o. male who was admitted 12/14/2021 with a chief complaint of osteoarthritis left knee, with a final diagnosis of Osteoarthritis Left Knee.  Patient was brought to the operating room on 12/14/2021 and underwent Procedure(s): LEFT TOTAL KNEE ARTHROPLASTY.    Patient was given perioperative antibiotics:  Anti-infectives (From admission, onward)    Start     Dose/Rate Route Frequency Ordered Stop   12/14/21 1400  bictegravir-emtricitabine-tenofovir AF (BIKTARVY) 50-200-25 MG per tablet 1 tablet        1 tablet Oral Daily 12/14/21 1311     12/14/21 1400  ceFAZolin (ANCEF) IVPB 1 g/50 mL premix        1 g 100 mL/hr over 30 Minutes Intravenous Every 6 hours 12/14/21 1311 12/15/21 1128   12/14/21 0730  ceFAZolin (ANCEF) IVPB 2g/100 mL premix        2 g 200 mL/hr over 30 Minutes Intravenous On call to O.R. 12/14/21 4081 12/14/21 4481     .  Patient was given sequential compression devices, early ambulation, and aspirin for DVT prophylaxis.  Recent vital signs: Patient Vitals for the past 24 hrs:  BP Temp Temp src Pulse Resp SpO2  12/15/21 2008 (!) 156/85 97.9 F (36.6 C) Oral (!) 102 16 100 %  12/15/21 1356 (!) 141/88 97.8 F (36.6 C) -- (!) 104 16 99 %  .  Recent laboratory studies: No results found.  Discharge Medications:   Allergies as of 12/16/2021       Reactions   Penicillins Other (See Comments)   Childhood allergy   Pork-derived Products Other (See Comments)   Patient preference   Latex Rash   Tape Rash        Medication List     TAKE these medications    Aleve 220 MG tablet Generic drug: naproxen sodium Take 220-440  mg by mouth 2 (two) times daily as needed (for pain).   Biktarvy 50-200-25 MG Tabs tablet Generic drug: bictegravir-emtricitabine-tenofovir AF Take 1 tablet by mouth daily.   gabapentin 300 MG capsule Commonly known as: NEURONTIN Take 2 capsules (600 mg total) by mouth 3 (three) times daily. For pain   hydrOXYzine 25 MG tablet Commonly known as: ATARAX Take 1 tablet (25 mg total) by mouth 3 (three) times daily as needed for anxiety.   oxyCODONE-acetaminophen 5-325 MG tablet Commonly known as: PERCOCET/ROXICET Take 1 tablet by mouth every 4 (four) hours as needed.   QUEtiapine Fumarate 150 MG Tabs Take 150 mg by mouth at bedtime. For mood control   rosuvastatin 20 MG tablet Commonly known as: CRESTOR Take 1 tablet (20 mg total) by mouth daily.   traZODone 150 MG tablet Commonly known as: DESYREL Take 1 tablet (150 mg total) by mouth at bedtime. For sleep What changed:  when to take this reasons to take this additional instructions   triamcinolone ointment 0.1 % Commonly known as: KENALOG Apply topically 2 (two) times daily. For fungal growth to right toes.   Voltaren 1 % Gel Generic drug: diclofenac Sodium Apply 2-4 g topically 3 (three) times daily as needed (right foot and ankle pain).  Durable Medical Equipment  (From admission, onward)           Start     Ordered   12/15/21 1420  For home use only DME 3 n 1  Once        12/15/21 1419   12/15/21 1048  For home use only DME Walker rolling  Once       Question Answer Comment  Walker: With 5 Inch Wheels   Patient needs a walker to treat with the following condition Gait instability      12/15/21 1048              Discharge Care Instructions  (From admission, onward)           Start     Ordered   12/15/21 0000  Weight bearing as tolerated       Question Answer Comment  Laterality left   Extremity Lower      12/15/21 0815            Diagnostic Studies: No results  found.  Patient benefited maximally from their hospital stay and there were no complications.     Disposition: Discharge disposition: 01-Home or Self Care      Discharge Instructions     Call MD / Call 911   Complete by: As directed    If you experience chest pain or shortness of breath, CALL 911 and be transported to the hospital emergency room.  If you develope a fever above 101 F, pus (white drainage) or increased drainage or redness at the wound, or calf pain, call your surgeon's office.   Call MD / Call 911   Complete by: As directed    If you experience chest pain or shortness of breath, CALL 911 and be transported to the hospital emergency room.  If you develope a fever above 101 F, pus (white drainage) or increased drainage or redness at the wound, or calf pain, call your surgeon's office.   Constipation Prevention   Complete by: As directed    Drink plenty of fluids.  Prune juice may be helpful.  You may use a stool softener, such as Colace (over the counter) 100 mg twice a day.  Use MiraLax (over the counter) for constipation as needed.   Constipation Prevention   Complete by: As directed    Drink plenty of fluids.  Prune juice may be helpful.  You may use a stool softener, such as Colace (over the counter) 100 mg twice a day.  Use MiraLax (over the counter) for constipation as needed.   Diet - low sodium heart healthy   Complete by: As directed    Diet - low sodium heart healthy   Complete by: As directed    Increase activity slowly as tolerated   Complete by: As directed    Increase activity slowly as tolerated   Complete by: As directed    Post-operative opioid taper instructions:   Complete by: As directed    POST-OPERATIVE OPIOID TAPER INSTRUCTIONS: It is important to wean off of your opioid medication as soon as possible. If you do not need pain medication after your surgery it is ok to stop day one. Opioids include: Codeine, Hydrocodone(Norco, Vicodin),  Oxycodone(Percocet, oxycontin) and hydromorphone amongst others.  Long term and even short term use of opiods can cause: Increased pain response Dependence Constipation Depression Respiratory depression And more.  Withdrawal symptoms can include Flu like symptoms Nausea, vomiting And more Techniques to manage these symptoms  Hydrate well Eat regular healthy meals Stay active Use relaxation techniques(deep breathing, meditating, yoga) Do Not substitute Alcohol to help with tapering If you have been on opioids for less than two weeks and do not have pain than it is ok to stop all together.  Plan to wean off of opioids This plan should start within one week post op of your joint replacement. Maintain the same interval or time between taking each dose and first decrease the dose.  Cut the total daily intake of opioids by one tablet each day Next start to increase the time between doses. The last dose that should be eliminated is the evening dose.      Post-operative opioid taper instructions:   Complete by: As directed    POST-OPERATIVE OPIOID TAPER INSTRUCTIONS: It is important to wean off of your opioid medication as soon as possible. If you do not need pain medication after your surgery it is ok to stop day one. Opioids include: Codeine, Hydrocodone(Norco, Vicodin), Oxycodone(Percocet, oxycontin) and hydromorphone amongst others.  Long term and even short term use of opiods can cause: Increased pain response Dependence Constipation Depression Respiratory depression And more.  Withdrawal symptoms can include Flu like symptoms Nausea, vomiting And more Techniques to manage these symptoms Hydrate well Eat regular healthy meals Stay active Use relaxation techniques(deep breathing, meditating, yoga) Do Not substitute Alcohol to help with tapering If you have been on opioids for less than two weeks and do not have pain than it is ok to stop all together.  Plan to wean off  of opioids This plan should start within one week post op of your joint replacement. Maintain the same interval or time between taking each dose and first decrease the dose.  Cut the total daily intake of opioids by one tablet each day Next start to increase the time between doses. The last dose that should be eliminated is the evening dose.      Weight bearing as tolerated   Complete by: As directed    Laterality: left   Extremity: Lower       Follow-up Information     Newt Minion, MD Follow up in 1 week(s).   Specialty: Orthopedic Surgery Contact information: [name] [address] [phone]         Charlott Rakes, MD Follow up.   Specialty: Family Medicine Contact information: [address] [place] [address] [phone]                  Signed: Newt Minion 12/16/2021, 8:37 AM

## 2021-12-19 ENCOUNTER — Telehealth: Payer: Self-pay

## 2021-12-19 ENCOUNTER — Telehealth: Payer: Self-pay | Admitting: Orthopedic Surgery

## 2021-12-19 NOTE — Telephone Encounter (Signed)
Transition Care Management Follow-up Telephone Call Date of discharge and from where: 12/16/2021, [facility] How have you been since you were released from the hospital? He said he is in a lot of pain, has been taking pain meds as needed and is now constipated. He said his left ankle is swollen and he will notify orthopedics when he calls to schedule his appointment.  Any questions or concerns? No  Items Reviewed: Did the pt receive and understand the discharge instructions provided? Yes  Medications obtained and verified? Yes - he said he has all of his medications.   Other? No  Any new allergies since your discharge? No  Dietary orders reviewed? No Do you have support at home? No - he said he is alone in [place], his family is in [facility] and Equipment/Supplies: Were home health services ordered? no If so, what is the name of the agency? N/a  Has the agency set up a time to come to the patient's home? not applicable Were any new equipment or medical supplies ordered?  Yes: 3:1 commode.  What is the name of the medical supply agency? [employer] Were you able to get the supplies/equipment? yes Do you have any questions related to the use of the equipment or supplies? No  He has an ice machine for his knee that was ordered by orthopedics.   Functional Questionnaire: (I = Independent and D = Dependent) ADLs: ambulating with RW, WBAT LLE.  He said he is doing the best he can trying to get around his home, get dressed and prepare meals    Follow up appointments reviewed:  PCP Hospital f/u appt confirmed? Yes  Scheduled to see Dr Margarita Rana - 01/04/2022.  [facility] f/u appt confirmed?  He stated that he needs to call orthopedics to schedule a follow up appointment.; [facility]- 12/22/2021     Are transportation arrangements needed? No  If their condition worsens, is the pt aware to call PCP or go to the Emergency Dept.? Yes Was the patient provided with contact  information for the PCP's office or ED? Yes Was to pt encouraged to call back with questions or concerns? Yes

## 2021-12-19 NOTE — Telephone Encounter (Signed)
Patient had LEFT TOTAL KNEE ARTHROPLASTY  on 12/06 is supposed to come in for post op but is unsure when, also patient would like to know when he should take the bandage off, please advise

## 2021-12-19 NOTE — Telephone Encounter (Signed)
Pt is scheduled on 12/28/21 for 2 week post op appointment. He was advised can take bandage off and replace with dry dressing. He did state his left ankle is 3 times the size as usual. He says he keeps his leg propped while lying down. I asked if he elevates while sitting down and he says he doesn't sit, he lays down. He also said he had stinging pain in his thigh and took off tight dressing around bandage. No redness, no streaking, no heat to leg. No fever or chills. Told him to continue to ice his knee and keep elevated during the day and cont with exercises. Call us if he has any concerns. (He does not have HHPT, insurance denied it, he was informed last week of this and was set up for neuro rehab and knows to call and schedule).

## 2021-12-19 NOTE — Telephone Encounter (Signed)
Pt informed. He will come in tomorrow at 2:15

## 2021-12-19 NOTE — Telephone Encounter (Signed)
From the discharge call:   He said he is in a lot of pain, has been taking pain meds as needed and is now constipated. He said his left ankle is swollen and he will notify orthopedics when he calls to schedule his appointment.    ambulating with RW, WBAT LLE.  He said he is doing the best he can trying to get around his home, get dressed and prepare meal   Scheduled to see Dr Margarita Rana - 01/04/2022.  He stated that he needs to call orthopedics to schedule a follow up appointment.; [facility]- 12/22/2021

## 2021-12-19 NOTE — Telephone Encounter (Signed)
Please advise to take Miralax and increase fiber intake as that will be helpful with regards to the constipation.

## 2021-12-20 ENCOUNTER — Ambulatory Visit (INDEPENDENT_AMBULATORY_CARE_PROVIDER_SITE_OTHER): Payer: Medicaid Other | Admitting: Orthopedic Surgery

## 2021-12-20 ENCOUNTER — Encounter: Payer: Self-pay | Admitting: Orthopedic Surgery

## 2021-12-20 DIAGNOSIS — Z96652 Presence of left artificial knee joint: Secondary | ICD-10-CM

## 2021-12-20 NOTE — Telephone Encounter (Signed)
I called patient to inform him that per Dr Margarita Rana: Please advise to take Miralax and increase fiber intake as that will be helpful with regards to the constipation.  When I asked him to confirm his last name and birthday, he said he was on a bus, could not talk. I asked him to call back when he was free.

## 2021-12-20 NOTE — Progress Notes (Signed)
Office Visit Note   Patient: Dylan [name]           Date of Birth: [birth year]           MRN: [id number] Visit Date: 11/29/2021              Requested by: Charlott Rakes, MD [place],  [place] [address] PCP: Charlott Rakes, MD  Chief Complaint  Patient presents with   Left Knee - Pain      HPI: Patient is a 56 year old gentleman who is seen in the preoperative for total knee arthroplasty.  Assessment & Plan: Visit Diagnoses:  1. Unilateral primary osteoarthritis, left knee     Plan: Questions were answered postoperative course was discussed risks and benefits were reviewed.  Patient states he understands wished to proceed at this time.  Follow-Up Instructions: Return in about 2 weeks (around 12/13/2021).   Ortho Exam  Patient is alert, oriented, no adenopathy, well-dressed, normal affect, normal respiratory effort. Patient has tricompartmental osteoarthritis of the left knee.  There is no signs of infection no effusion.  Imaging: No results found. No images are attached to the encounter.  Labs: Lab Results  Component Value Date   HGBA1C 5.8 (H) 09/01/2021   HGBA1C 5.7 (H) 03/13/2021   HGBA1C 6.0 (H) 10/26/2020   ESRSEDRATE 2 07/14/2020   REPTSTATUS 03/03/2017 FINAL 02/26/2017   CULT  02/26/2017    NO GROWTH 5 DAYS Performed at [facility], [place] [address], [place], [place] [address]      Lab Results  Component Value Date   ALBUMIN 3.3 (L) 11/26/2021   ALBUMIN 4.4 08/28/2021   ALBUMIN 3.1 (L) 07/05/2021    Lab Results  Component Value Date   MG 2.8 (H) 02/20/2017   Lab Results  Component Value Date   VD25OH 14.4 (L) 01/17/2021    No results found for: "PREALBUMIN"    Latest Ref Rng & Units 11/26/2021   11:51 AM 11/08/2021   10:08 AM 10/28/2021    5:40 PM  CBC EXTENDED  WBC 4.0 - 10.5 K/uL 7.0  4.7  9.7   RBC 4.22 - 5.81 MIL/uL 4.56  4.53  4.72   Hemoglobin 13.0 - 17.0 g/dL 14.0  14.0  14.6   HCT 39.0 -  52.0 % 42.4  41.0  43.3   Platelets 150 - 400 K/uL 184  234  218   NEUT# 1,500 - 7,800 cells/uL  2,186    Lymph# 850 - 3,900 cells/uL  1,880       There is no height or weight on file to calculate BMI.  Orders:  No orders of the defined types were placed in this encounter.  No orders of the defined types were placed in this encounter.    Procedures: No procedures performed  Clinical Data: No additional findings.  ROS:  All other systems negative, except as noted in the HPI. Review of Systems  Objective: Vital Signs: There were no vitals taken for this visit.  Specialty Comments:  No specialty comments available.  PMFS History: Patient Active Problem List   Diagnosis Date Noted   S/P total knee arthroplasty, left 12/14/2021   Unilateral primary osteoarthritis, left knee 12/14/2021   Total knee replacement status, left 12/14/2021   Intentional drug overdose ([place]) 11/26/2021   Chronic fatigue 11/16/2021   Abnormal EKG 11/16/2021   Overweight (BMI 25.0-29.9) 11/16/2021   Transaminitis 07/03/2021   Right foot ulcer ([place]) 07/03/2021   Onychomycosis 04/19/2021  Cocaine abuse ([place]) 03/16/2021   Bipolar I disorder, most recent episode depressed ([place]) 03/15/2021   Bipolar 1 disorder, depressed ([place]) 03/15/2021   Nodular radiologic density 02/02/2021   Lesion of liver 02/02/2021   Liver disease 02/02/2021   Vitamin D deficiency 01/18/2021   Alcohol abuse 01/17/2021   Methamphetamine abuse ([place]) 01/17/2021   Lesion of pancreas 01/17/2021   Substance induced mood disorder ([place]) 10/26/2020   Fatty liver 03/09/2020   Schizophrenia ([place]) 03/08/2020   Generalized anxiety disorder 02/03/2020   Insomnia due to other mental disorder 02/03/2020   Lisfranc dislocation, right, initial encounter    Prediabetes 07/22/2019   Tobacco abuse 07/10/2018   Schizoaffective disorder, bipolar type ([place])    Acute renal failure (ARF) ([place])    Suicidal ideations    Chronic viral  hepatitis B 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 coma ([place])    AKI (acute kidney injury) ([place]) 02/19/2017   Rhabdomyolysis 02/19/2017   Suicide attempt by acetaminophen overdose ([place]) 02/19/2017   Acute hepatitis 02/19/2017   Elevated LFTs    Schizoaffective disorder, depressive type ([place]) 09/05/2011   Degenerative disc disease 08/21/2011   HIV disease ([place]) 08/17/2011   Past Medical History:  Diagnosis Date   Anxiety    Arthritis    Bipolar 1 disorder ([place])    Colon polyps    Depression    GERD (gastroesophageal reflux disease)    Hepatitis B    Human immunodeficiency virus (HIV) ([place])    Hyperlipidemia    Hypertension    Neuromuscular disorder ([place])    neuropathy   Neuropathy    Pre-diabetes    Schizophrenia ([place])     Family History  Problem Relation Age of Onset   CAD Mother    Diabetes Father    Stroke Father    Colon cancer Maternal Aunt 60   Diabetes Maternal Aunt    Heart disease Maternal Uncle    Heart attack Maternal Uncle    Esophageal cancer Neg Hx    Rectal cancer Neg Hx    Stomach cancer Neg Hx     Past Surgical History:  Procedure Laterality Date   FOOT ARTHRODESIS Right 09/17/2019   Procedure: FUSION RIGHT LISFRANC JOINT;  Surgeon: Newt Minion, MD;  Location: [facility];  Service: Orthopedics;  Laterality: Right;   FOOT ARTHRODESIS Right 09/2019   HEMORROIDECTOMY     IR FLUORO GUIDE CV LINE RIGHT  02/22/2017   IR US GUIDE VASC ACCESS RIGHT  02/22/2017   TOTAL KNEE ARTHROPLASTY Left 12/14/2021   Procedure: LEFT TOTAL KNEE ARTHROPLASTY;  Surgeon: Newt Minion, MD;  Location: [facility];  Service: Orthopedics;  Laterality: Left;   TUMOR REMOVAL     From Chest   Social History   Occupational History   Not on file  Tobacco Use   Smoking status: Every Day    Packs/day: 0.50    Years: 25.00    Total pack years: 12.50    Types: E-cigarettes, Cigarettes   Smokeless tobacco: Never   Tobacco comments:    Vapes daily  Vaping Use   Vaping Use: Every day   Start  date: 12/09/2020   Substances: Nicotine  Substance and Sexual Activity   Alcohol use: Yes    Comment: occassional drinker   Drug use: Not Currently    Types: Cocaine, Marijuana    Comment: Last used 1 month ago as of 12/12/21.   Sexual activity: Yes    Partners: Female

## 2021-12-21 ENCOUNTER — Encounter: Payer: Self-pay | Admitting: Orthopedic Surgery

## 2021-12-21 ENCOUNTER — Ambulatory Visit: Payer: Medicaid Other | Attending: Orthopedic Surgery | Admitting: Physical Therapy

## 2021-12-21 ENCOUNTER — Other Ambulatory Visit: Payer: Self-pay

## 2021-12-21 ENCOUNTER — Encounter: Payer: Self-pay | Admitting: Physical Therapy

## 2021-12-21 DIAGNOSIS — Z96652 Presence of left artificial knee joint: Secondary | ICD-10-CM | POA: Insufficient documentation

## 2021-12-21 DIAGNOSIS — R600 Localized edema: Secondary | ICD-10-CM | POA: Insufficient documentation

## 2021-12-21 DIAGNOSIS — R2681 Unsteadiness on feet: Secondary | ICD-10-CM | POA: Diagnosis present

## 2021-12-21 DIAGNOSIS — M25562 Pain in left knee: Secondary | ICD-10-CM | POA: Diagnosis present

## 2021-12-21 DIAGNOSIS — M6281 Muscle weakness (generalized): Secondary | ICD-10-CM | POA: Diagnosis present

## 2021-12-21 NOTE — Therapy (Signed)
OUTPATIENT PHYSICAL THERAPY LOWER EXTREMITY EVALUATION  Patient Name: Dylan [name] MRN: [id number] DOB:1965/05/25, 56 y.o., male Today's Date: 12/21/2021   PT End of Session - 12/21/21 0957     Visit Number 1    Number of Visits --   1-2x/week   Date for PT Re-Evaluation 02/15/22    Authorization Type  MCD - KOOS Jr.    PT Start Time [phone]    PT Stop Time 0946    PT Time Calculation (min) 32 min             Past Medical History:  Diagnosis Date   Anxiety    Arthritis    Bipolar 1 disorder ([place])    Colon polyps    Depression    GERD (gastroesophageal reflux disease)    Hepatitis B    Human immunodeficiency virus (HIV) ([place])    Hyperlipidemia    Hypertension    Neuromuscular disorder ([place])    neuropathy   Neuropathy    Pre-diabetes    Schizophrenia ([place])    Past Surgical History:  Procedure Laterality Date   FOOT ARTHRODESIS Right 09/17/2019   Procedure: FUSION RIGHT LISFRANC JOINT;  Surgeon: Newt Minion, MD;  Location: [facility];  Service: Orthopedics;  Laterality: Right;   FOOT ARTHRODESIS Right 09/2019   HEMORROIDECTOMY     IR FLUORO GUIDE CV LINE RIGHT  02/22/2017   IR US GUIDE VASC ACCESS RIGHT  02/22/2017   TOTAL KNEE ARTHROPLASTY Left 12/14/2021   Procedure: LEFT TOTAL KNEE ARTHROPLASTY;  Surgeon: Newt Minion, MD;  Location: [facility];  Service: Orthopedics;  Laterality: Left;   TUMOR REMOVAL     From Chest   Patient Active Problem List   Diagnosis Date Noted   S/P total knee arthroplasty, left 12/14/2021   Unilateral primary osteoarthritis, left knee 12/14/2021   Total knee replacement status, left 12/14/2021   Intentional drug overdose ([place]) 11/26/2021   Chronic fatigue 11/16/2021   Abnormal EKG 11/16/2021   Overweight (BMI 25.0-29.9) 11/16/2021   Transaminitis 07/03/2021   Right foot ulcer ([place]) 07/03/2021   Onychomycosis 04/19/2021   Cocaine abuse ([place]) 03/16/2021   Bipolar I disorder, most recent episode depressed ([place]) 03/15/2021   Bipolar 1  disorder, depressed ([place]) 03/15/2021   Nodular radiologic density 02/02/2021   Lesion of liver 02/02/2021   Liver disease 02/02/2021   Vitamin D deficiency 01/18/2021   Alcohol abuse 01/17/2021   Methamphetamine abuse ([place]) 01/17/2021   Lesion of pancreas 01/17/2021   Substance induced mood disorder ([place]) 10/26/2020   Fatty liver 03/09/2020   Schizophrenia ([place]) 03/08/2020   Generalized anxiety disorder 02/03/2020   Insomnia due to other mental disorder 02/03/2020   Lisfranc dislocation, right, initial encounter    Prediabetes 07/22/2019   Tobacco abuse 07/10/2018   Schizoaffective disorder, bipolar type ([place])    Acute renal failure (ARF) ([place])    Suicidal ideations    Chronic 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 coma ([place])    AKI (acute kidney injury) ([place]) 02/19/2017   Rhabdomyolysis 02/19/2017   Suicide attempt by acetaminophen overdose ([place]) 02/19/2017   Acute hepatitis 02/19/2017   Elevated LFTs    Schizoaffective disorder, depressive type ([place]) 09/05/2011   Degenerative disc disease 08/21/2011   HIV disease ([place]) 08/17/2011    PCP: Charlott Rakes, MD  REFERRING PROVIDER: Newt Minion, MD  THERAPY DIAG:  Acute pain of left knee - Plan: PT plan of care cert/re-cert  Muscle weakness - Plan: PT plan of  care cert/re-cert  Unsteadiness on feet - Plan: PT plan of care cert/re-cert  Localized edema - Plan: PT plan of care cert/re-cert  REFERRING DIAG: Status post total knee replacement, left [Z96.652]   Rationale for Evaluation and Treatment:  Rehabilitation  SUBJECTIVE:  PERTINENT PAST HISTORY:  Hepatitis B, HIV, bipolar, substance abuse (cocaine, methamphetamine, EtOH), bipolar, schizoaffective disorder, chronic fatigue, Lisfranc dislocation R with arthrodesis 09/17/19; suicidal ideations, DDD, and arthritis.       PRECAUTIONS: Fall  WEIGHT BEARING RESTRICTIONS No  FALLS:  Has patient fallen in last 6 months? No, Number of falls: 0  MOI/History  of condition:  Onset date: 12/6  SUBJECTIVE STATEMENT  Dylan [name] is a 56 y.o. male who presents to clinic with chief complaint of L knee pain and stiffness following L TKA on 12/6.  Chronic L knee pain with no injury.  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 Yes Pain location: Diffuse L knee pain  NPRS scale:  2/10 to 10/10 Aggravating factors: activity but can occur without activity as well Relieving factors: Ice Pain description: intermittent, constant, sharp, and aching Stage: Acute Stability: getting better 24 hour pattern: NA   Occupation: Engineer, technical sales: quad cane and FWW  Hand Dominance: NA  Patient Goals/Specific Activities: get back to attending culinary school, reduce pain   OBJECTIVE:   DIAGNOSTIC FINDINGS:  NA   GENERAL OBSERVATION/GAIT:   Slow antalgic gait using quad cane, reduced step length R reduced time in stance L, reduced swing L   SENSATION:  Light touch: Appears intact  PALPATION: Staples in place, wound appears well healing, no streaking or redness, moderate edema, calf supple and non-tender  MUSCLE LENGTH: Hamstrings: Right subtle restriction; Left significant restriction ASLR: Right ASLR = PSLR; Left ASLR significantly reduced compared to PSLR    LE MMT:  MMT Right 12/21/2021 Left 12/21/2021  Hip flexion (L2, L3) 4 3-  Knee extension (L3) 4+ 3-*  Knee flexion 4+ 3*  Hip abduction    Hip extension    Hip external rotation    Hip internal rotation    Hip adduction    Ankle dorsiflexion (L4)    Ankle plantarflexion (S1)    Ankle inversion    Ankle eversion    Great Toe ext (L5)    Grossly     (Blank rows = not tested, score listed is out of 5 possible points.  N = WNL, D = diminished, C = clear for gross weakness with myotome testing, * = concordant pain with  testing)  LE ROM:  ROM Right 12/21/2021 Left 12/21/2021  Hip flexion    Hip extension    Hip abduction    Hip adduction    Hip internal rotation    Hip external rotation    Knee flexion Full >130 degrees 50  Knee extension 0 Lacking 12  Ankle dorsiflexion    Ankle plantarflexion    Ankle inversion    Ankle eversion     (Blank rows = not tested, N = WNL, * = concordant pain with testing)  Functional Tests  Eval (12/21/2021)    Progressive balance screen (highest level completed for >/= 10''):  Feet together: 10'' Semi Tandem: R in rear 10'', L in rear 10'' Tandem: R in rear unable, L in rear 4''  86'' STS: 5x  UE used? Y                                                       PATIENT SURVEYS:  Koos Jr. - KOOS, JR. Score: 21 / 28, Interval Score: 34.174 / 100   TODAY'S TREATMENT: Creating, reviewing, and completing below HEP   PATIENT EDUCATION:  POC, diagnosis, prognosis, HEP, and outcome measures.  Pt educated via explanation, demonstration, and handout (HEP).  Pt confirms understanding verbally.   HOME EXERCISE PROGRAM: Access Code: [email or web address] URL: [email or web address] Date: 12/21/2021 Prepared by: Shearon Balo  Exercises - Long Sitting 4 Way Patellar Glide  - 4 x daily - 7 x weekly - 3 sets - 10 reps - Supine Quad Set  - 5 x daily - 7 x weekly - 2 sets - 10 reps - 10 hold - Ice  - 5 x daily - 7 x weekly - 1 sets - 1 reps - 20 min hold - Seated Knee Flexion Stretch  - 2 x daily - 7 x weekly - 1 sets - 4 reps - 1' hold - Seated Hamstring Stretch  - 2 x daily - 7 x weekly - 1 sets - 3 reps - 45 hold  ASTERISK SIGNS   Asterisk Signs Eval (12/21/2021)       Lacking knee flexion 50       L knee ext Lacking 12       30'' STS 5x w/ UE       Balance Tandem unable bil                 ASSESSMENT:  CLINICAL IMPRESSION: Kharon is a 56 y.o. male who presents to clinic with signs and sxs consistent with L knee swelling, pain,  weakness, and stiffness following L TKA on 12/14/2021.  Appears to be following normal post-op course.      OBJECTIVE IMPAIRMENTS: Pain, L knee ROM, L knee and hip strength, gait, balance  ACTIVITY LIMITATIONS: bending, squatting, transfers, school, housework, ambulation  PERSONAL FACTORS: See medical history and pertinent history   REHAB POTENTIAL: Good  CLINICAL DECISION MAKING: Stable/uncomplicated  EVALUATION COMPLEXITY: Low   GOALS:   SHORT TERM GOALS: Target date: 01/18/2022  Savas will be >75% HEP compliant to improve carryover between sessions and facilitate independent management of condition  Evaluation (12/21/2021): ongoing Goal status: INITIAL   LONG TERM GOALS: Target date: 02/15/2022  [place] will show a >/= 40 pt improvement in their KOOS Jr. score (MCID is 15 pts) as a proxy for functional improvement   Evaluation/Baseline (12/21/2021): KOOS, JR. Score: 21 / 28, Interval Score: 34.174 / 100 Goal status: INITIAL    2.  Shantel will achieve 110 degrees knee flexion to improve ability to complete transfers and navigate steps  Evaluation/Baseline (12/21/2021): 50 degrees Goal status: INITIAL    3.  Helen will achieve 3 degrees knee extension to improve mechanics of gait  Evaluation/Baseline (12/21/2021): lacking 12 degrees Goal status: INITIAL    4.  Hermen[place]ildo will be able to stand for >30'' in SLS stance, to show a significant improvement in balance in order to reduce fall risk   Evaluation/Baseline (12/21/2021): tandem stance: unable Goal status: INITIAL   5.  Jari will be able to return to culinary school, not limited by pain  Evaluation/Baseline (12/21/2021):  limited Goal status: INITIAL    PLAN: PT FREQUENCY: 1-2x/week  PT DURATION: 8 weeks (Ending 02/15/2022)  PLANNED INTERVENTIONS: Therapeutic exercises, Aquatic therapy, Therapeutic activity, Neuro Muscular re-education, Gait training, Patient/Family education, Joint mobilization, Dry  Needling, Electrical stimulation, Spinal mobilization and/or manipulation, Moist heat, Taping, Vasopneumatic device, Ionotophoresis 4mg /ml Dexamethasone, and Manual therapy  PLAN FOR NEXT SESSION: TKA progression as tolerated   Shearon Balo PT, DPT 12/21/2021, 10:00 AM

## 2021-12-21 NOTE — Progress Notes (Signed)
Office Visit Note   Patient: Dylan [name]           Date of Birth: [birth year]           MRN: [id number] Visit Date: 12/20/2021              Requested by: Charlott Rakes, MD [address] [place],  [place] [address] PCP: Charlott Rakes, MD  Chief Complaint  Patient presents with   Left Knee - Routine Post Op    12/14/2021 left total knee replacement        HPI: Patient is a 56 year old gentleman who presents 1 week status post left total knee arthroplasty.  Patient states he has pain and swelling in his ankle and thigh.  Patient denies any warmth or redness or calf pain.  Assessment & Plan: Visit Diagnoses:  1. Total knee replacement status, left     Plan: Discussed the importance of elevation flexion extension range of motion of the knee continued use ice patient was given 2 compression socks to wear extending above the knee.  Recommended aspirin daily.  Follow-Up Instructions: Return in about 1 week (around 12/27/2021).   Ortho Exam  Patient is alert, oriented, no adenopathy, well-dressed, normal affect, normal respiratory effort. Examination patient has no pain to palpation of the calf or thigh dorsiflexion of the ankle with knee extended is not painful.  Patient does have significant swelling from his leg being dependent foot leg and thigh.  There is no cellulitis of the incision is well-approximated.  He lacks about 10 degrees to full extension.  Imaging: No results found. No images are attached to the encounter.  Labs: Lab Results  Component Value Date   HGBA1C 5.8 (H) 09/01/2021   HGBA1C 5.7 (H) 03/13/2021   HGBA1C 6.0 (H) 10/26/2020   ESRSEDRATE 2 07/14/2020   REPTSTATUS 03/03/2017 FINAL 02/26/2017   CULT  02/26/2017    NO GROWTH 5 DAYS Performed at [facility], [place] [address], [place], [place] [address]      Lab Results  Component Value Date   ALBUMIN 3.3 (L) 11/26/2021   ALBUMIN 4.4 08/28/2021   ALBUMIN 3.1 (L) 07/05/2021     Lab Results  Component Value Date   MG 2.8 (H) 02/20/2017   Lab Results  Component Value Date   VD25OH 14.4 (L) 01/17/2021    No results found for: "PREALBUMIN"    Latest Ref Rng & Units 11/26/2021   11:51 AM 11/08/2021   10:08 AM 10/28/2021    5:40 PM  CBC EXTENDED  WBC 4.0 - 10.5 K/uL 7.0  4.7  9.7   RBC 4.22 - 5.81 MIL/uL 4.56  4.53  4.72   Hemoglobin 13.0 - 17.0 g/dL 14.0  14.0  14.6   HCT 39.0 - 52.0 % 42.4  41.0  43.3   Platelets 150 - 400 K/uL 184  234  218   NEUT# 1,500 - 7,800 cells/uL  2,186    Lymph# 850 - 3,900 cells/uL  1,880       There is no height or weight on file to calculate BMI.  Orders:  No orders of the defined types were placed in this encounter.  No orders of the defined types were placed in this encounter.    Procedures: No procedures performed  Clinical Data: No additional findings.  ROS:  All other systems negative, except as noted in the HPI. Review of Systems  Objective: Vital Signs: There were no vitals taken for this  visit.  Specialty Comments:  No specialty comments available.  PMFS History: Patient Active Problem List   Diagnosis Date Noted   S/P total knee arthroplasty, left 12/14/2021   Unilateral primary osteoarthritis, left knee 12/14/2021   Total knee replacement status, left 12/14/2021   Intentional drug overdose ([place]) 11/26/2021   Chronic fatigue 11/16/2021   Abnormal EKG 11/16/2021   Overweight (BMI 25.0-29.9) 11/16/2021   Transaminitis 07/03/2021   Right foot ulcer ([place]) 07/03/2021   Onychomycosis 04/19/2021   Cocaine abuse ([place]) 03/16/2021   Bipolar I disorder, most recent episode depressed ([place]) 03/15/2021   Bipolar 1 disorder, depressed ([place]) 03/15/2021   Nodular radiologic density 02/02/2021   Lesion of liver 02/02/2021   Liver disease 02/02/2021   Vitamin D deficiency 01/18/2021   Alcohol abuse 01/17/2021   Methamphetamine abuse ([place]) 01/17/2021   Lesion of pancreas 01/17/2021   Substance  induced mood disorder ([place]) 10/26/2020   Fatty liver 03/09/2020   Schizophrenia ([place]) 03/08/2020   Generalized anxiety disorder 02/03/2020   Insomnia due to other mental disorder 02/03/2020   Lisfranc dislocation, right, initial encounter    Prediabetes 07/22/2019   Tobacco abuse 07/10/2018   Schizoaffective disorder, bipolar type ([place])    Acute renal failure (ARF) ([place])    Suicidal ideations    Chronic 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 coma ([place])    AKI (acute kidney injury) ([place]) 02/19/2017   Rhabdomyolysis 02/19/2017   Suicide attempt by acetaminophen overdose ([place]) 02/19/2017   Acute hepatitis 02/19/2017   Elevated LFTs    Schizoaffective disorder, depressive type ([place]) 09/05/2011   Degenerative disc disease 08/21/2011   HIV disease ([place]) 08/17/2011   Past Medical History:  Diagnosis Date   Anxiety    Arthritis    Bipolar 1 disorder ([place])    Colon polyps    Depression    GERD (gastroesophageal reflux disease)    Hepatitis B    Human immunodeficiency virus (HIV) ([place])    Hyperlipidemia    Hypertension    Neuromuscular disorder ([place])    neuropathy   Neuropathy    Pre-diabetes    Schizophrenia ([place])     Family History  Problem Relation Age of Onset   CAD Mother    Diabetes Father    Stroke Father    Colon cancer Maternal Aunt 60   Diabetes Maternal Aunt    Heart disease Maternal Uncle    Heart attack Maternal Uncle    Esophageal cancer Neg Hx    Rectal cancer Neg Hx    Stomach cancer Neg Hx     Past Surgical History:  Procedure Laterality Date   FOOT ARTHRODESIS Right 09/17/2019   Procedure: FUSION RIGHT LISFRANC JOINT;  Surgeon: Newt Minion, MD;  Location: [facility];  Service: Orthopedics;  Laterality: Right;   FOOT ARTHRODESIS Right 09/2019   HEMORROIDECTOMY     IR FLUORO GUIDE CV LINE RIGHT  02/22/2017   IR US GUIDE VASC ACCESS RIGHT  02/22/2017   TOTAL KNEE ARTHROPLASTY Left 12/14/2021   Procedure: LEFT TOTAL KNEE ARTHROPLASTY;  Surgeon:  Newt Minion, MD;  Location: [facility];  Service: Orthopedics;  Laterality: Left;   TUMOR REMOVAL     From Chest   Social History   Occupational History   Not on file  Tobacco Use   Smoking status: Every Day    Packs/day: 0.50    Years: 25.00    Total pack years: 12.50    Types: E-cigarettes, Cigarettes   Smokeless  tobacco: Never   Tobacco comments:    Vapes daily  Vaping Use   Vaping Use: Every day   Start date: 12/09/2020   Substances: Nicotine  Substance and Sexual Activity   Alcohol use: Yes    Comment: occassional drinker   Drug use: Not Currently    Types: Cocaine, Marijuana    Comment: Last used 1 month ago as of 12/12/21.   Sexual activity: Yes    Partners: Female

## 2021-12-22 ENCOUNTER — Other Ambulatory Visit: Payer: Self-pay

## 2021-12-22 ENCOUNTER — Ambulatory Visit (INDEPENDENT_AMBULATORY_CARE_PROVIDER_SITE_OTHER): Payer: Medicaid Other | Admitting: Internal Medicine

## 2021-12-22 DIAGNOSIS — B20 Human immunodeficiency virus [HIV] disease: Secondary | ICD-10-CM | POA: Diagnosis not present

## 2021-12-22 NOTE — Progress Notes (Signed)
Virtual Visit via Telephone Note  I connected with [name] on 12/22/21 at  4:00 PM EST by telephone and verified that I am speaking with the correct person using two identifiers.  Location: Patient: at home Provider: in clinic   I discussed the limitations, risks, security and privacy concerns of performing an evaluation and management service by telephone and the availability of in person appointments. I also discussed with the patient that there may be a patient responsible charge related to this service. The patient expressed understanding and agreed to proceed.   History of Present Illness: Had knee replacement  1 week ago by dr Sharol Given- has been abit painful in the recovery but saw dr duda in follow up who mentioned ice, elevation and ibuprofen if needed. Healing appropriately for the time frame   Observations/Objective:  Fluent speech Assessment and Plan: Hypercholesteremia= dose recently increase to adjust  Hiv disease=   Left knee replacement=  Follow Up Instructions:    I discussed the assessment and treatment plan with the patient. The patient was provided an opportunity to ask questions and all were answered. The patient agreed with the plan and demonstrated an understanding of the instructions.   The patient was advised to call back or seek an in-person evaluation if the symptoms worsen or if the condition fails to improve as anticipated.  I provided 10 minutes of non-face-to-face time during this encounter.   Carlyle Basques, MD

## 2021-12-26 NOTE — Therapy (Signed)
OUTPATIENT PHYSICAL THERAPY TREATMENT NOTE   Patient Name: Dylan [name] MRN: [id number] DOB:03-06-1965, 56 y.o., male Today's Date: 12/27/2021  PCP: Charlott Rakes, MD  REFERRING PROVIDER: Newt Minion, MD   END OF SESSION:   PT End of Session - 12/27/21 0906     Visit Number 2    Date for PT Re-Evaluation 02/15/22    Authorization Type [place] [facility] - KOOS Jr.    PT Start Time [phone]    PT Stop Time 0958    PT Time Calculation (min) 48 min    Activity Tolerance Patient tolerated treatment well;Patient limited by pain    Behavior During Therapy [facility] for tasks assessed/performed             Past Medical History:  Diagnosis Date   Anxiety    Arthritis    Bipolar 1 disorder ([place])    Colon polyps    Depression    GERD (gastroesophageal reflux disease)    Hepatitis B    Human immunodeficiency virus (HIV) ([place])    Hyperlipidemia    Hypertension    Neuromuscular disorder ([place])    neuropathy   Neuropathy    Pre-diabetes    Schizophrenia ([place])    Past Surgical History:  Procedure Laterality Date   FOOT ARTHRODESIS Right 09/17/2019   Procedure: FUSION RIGHT LISFRANC JOINT;  Surgeon: Newt Minion, MD;  Location: [place];  Service: Orthopedics;  Laterality: Right;   FOOT ARTHRODESIS Right 09/2019   HEMORROIDECTOMY     IR FLUORO GUIDE CV LINE RIGHT  02/22/2017   IR US GUIDE VASC ACCESS RIGHT  02/22/2017   TOTAL KNEE ARTHROPLASTY Left 12/14/2021   Procedure: LEFT TOTAL KNEE ARTHROPLASTY;  Surgeon: Newt Minion, MD;  Location: [place];  Service: Orthopedics;  Laterality: Left;   TUMOR REMOVAL     From Chest   Patient Active Problem List   Diagnosis Date Noted   S/P total knee arthroplasty, left 12/14/2021   Unilateral primary osteoarthritis, left knee 12/14/2021   Total knee replacement status, left 12/14/2021   Intentional drug overdose ([place]) 11/26/2021   Chronic fatigue 11/16/2021   Abnormal EKG 11/16/2021   Overweight (BMI 25.0-29.9) 11/16/2021   Transaminitis 07/03/2021    Right foot ulcer ([place]) 07/03/2021   Onychomycosis 04/19/2021   Cocaine abuse ([place]) 03/16/2021   Bipolar I disorder, most recent episode depressed ([place]) 03/15/2021   Bipolar 1 disorder, depressed ([place]) 03/15/2021   Nodular radiologic density 02/02/2021   Lesion of liver 02/02/2021   Liver disease 02/02/2021   Vitamin D deficiency 01/18/2021   Alcohol abuse 01/17/2021   Methamphetamine abuse ([place]) 01/17/2021   Lesion of pancreas 01/17/2021   Substance induced mood disorder ([place]) 10/26/2020   Fatty liver 03/09/2020   Schizophrenia ([place]) 03/08/2020   Generalized anxiety disorder 02/03/2020   Insomnia due to other mental disorder 02/03/2020   Lisfranc dislocation, right, initial encounter    Prediabetes 07/22/2019   Tobacco abuse 07/10/2018   Schizoaffective disorder, bipolar type ([place])    Acute renal failure (ARF) ([place])    Suicidal ideations    Chronic 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 coma ([place])    AKI (acute kidney injury) ([place]) 02/19/2017   Rhabdomyolysis 02/19/2017   Suicide attempt by acetaminophen overdose ([place]) 02/19/2017   Acute hepatitis 02/19/2017   Elevated LFTs    Schizoaffective disorder, depressive type ([place]) 09/05/2011   Degenerative disc disease 08/21/2011   HIV disease ([place]) 08/17/2011    REFERRING DIAG: Status post total  knee replacement, left [Z96.652]    THERAPY DIAG:  Acute pain of left knee  Muscle weakness  Unsteadiness on feet  Localized edema  Rationale for Evaluation and Treatment Rehabilitation  PERTINENT HISTORY: Hepatitis B, HIV, bipolar, substance abuse (cocaine, methamphetamine, EtOH), bipolar, schizoaffective disorder, chronic fatigue, Lisfranc dislocation R with arthrodesis 09/17/19; suicidal ideations, DDD, and arthritis.    PRECAUTIONS: Fall  SUBJECTIVE:                                                                                                                                                                                       SUBJECTIVE STATEMENT:  Patient reports that his pain is better this week than it was last week and only gets to a 7/10 at the highest.   PAIN:  Are you having pain? Yes Pain location: Diffuse L knee pain  NPRS scale:  6/10 to 7/10 Aggravating factors: activity but can occur without activity as well Relieving factors: Ice Pain description: intermittent, constant, sharp, and aching Stage: Acute Stability: getting better 24 hour pattern: NA    OBJECTIVE: (objective measures completed at initial evaluation unless otherwise dated)   DIAGNOSTIC FINDINGS:  NA             GENERAL OBSERVATION/GAIT:                     Slow antalgic gait using quad cane, reduced step length R reduced time in stance L, reduced swing L    SENSATION:          Light touch: Appears intact   PALPATION: Staples in place, wound appears well healing, no streaking or redness, moderate edema, calf supple and non-tender   MUSCLE LENGTH: Hamstrings: Right subtle restriction; Left significant restriction ASLR: Right ASLR = PSLR; Left ASLR significantly reduced compared to PSLR      LE MMT:   MMT Right 12/21/2021 Left 12/21/2021  Hip flexion (L2, L3) 4 3-  Knee extension (L3) 4+ 3-*  Knee flexion 4+ 3*  Hip abduction      Hip extension      Hip external rotation      Hip internal rotation      Hip adduction      Ankle dorsiflexion (L4)      Ankle plantarflexion (S1)      Ankle inversion      Ankle eversion      Great Toe ext (L5)      Grossly        (Blank rows = not tested, score listed is out of 5 possible points.  N = WNL, D = diminished, C = clear for gross weakness  with myotome testing, * = concordant pain with testing)   LE ROM:   ROM Right 12/21/2021 Left 12/21/2021 Left 12/27/21  Hip flexion       Hip extension       Hip abduction       Hip adduction       Hip internal rotation       Hip external rotation       Knee flexion Full >130 degrees 50 71  Knee extension 0  Lacking 12 Lacking 7  Ankle dorsiflexion       Ankle plantarflexion       Ankle inversion       Ankle eversion         (Blank rows = not tested, N = WNL, * = concordant pain with testing)   Functional Tests   Eval (12/21/2021)      Progressive balance screen (highest level completed for >/= 10''):   Feet together: 10'' Semi Tandem: R in rear 10'', L in rear 10'' Tandem: R in rear unable, L in rear 4''        30'' STS: 5x  UE used? Y                                                                                                PATIENT SURVEYS:  Koos Jr. - KOOS, JR. Score: 21 / 28, Interval Score: 34.174 / 100     TODAY'S TREATMENT: Creating, reviewing, and completing below HEP     PATIENT EDUCATION:  POC, diagnosis, prognosis, HEP, and outcome measures.  Pt educated via explanation, demonstration, and handout (HEP).  Pt confirms understanding verbally.    HOME EXERCISE PROGRAM: Access Code: [email or web address] URL: [email or web address] Date: 12/21/2021 Prepared by: Shearon Balo   Exercises - Long Sitting 4 Way Patellar Glide  - 4 x daily - 7 x weekly - 3 sets - 10 reps - Supine Quad Set  - 5 x daily - 7 x weekly - 2 sets - 10 reps - 10 hold - Ice  - 5 x daily - 7 x weekly - 1 sets - 1 reps - 20 min hold - Seated Knee Flexion Stretch  - 2 x daily - 7 x weekly - 1 sets - 4 reps - 1' hold - Seated Hamstring Stretch  - 2 x daily - 7 x weekly - 1 sets - 3 reps - 45 hold   ASTERISK SIGNS     Asterisk Signs Eval (12/21/2021)            Lacking knee flexion 50            L knee ext Lacking 12            30'' STS 5x w/ UE            Balance Tandem unable bil                             TREATMENT 12/19: Therapeutic Exercise: - Nustep level 2 seat 14 LE only for ROM x 5 mins -  Quad set 5" hold 2x10 Lt (towel under knee) - Seated hamstring stretch 3x30" Lt - Seated knee flexion stretch 5" hold x10 Lt (on towel) - Supine knee flexion stretch with  strap 5" hold x10 Lt Manual: - AP mob grade II-III - Flexion/extension ROM   ASSESSMENT:   CLINICAL IMPRESSION: Patient presents for first follow up PT session reporting improved pain from last week. Session today focused on Lt knee ROM and quadriceps strengthening as well as manual techniques to decrease tension and improve flexion/extension. He shows improvement in knee ROM with 7-71 achieved today. He has the most pain with flexion. Patient was able to tolerate all prescribed exercises with no adverse effects. Patient continues to benefit from skilled PT services and should be progressed as able to improve functional independence.     OBJECTIVE IMPAIRMENTS: Pain, L knee ROM, L knee and hip strength, gait, balance   ACTIVITY LIMITATIONS: bending, squatting, transfers, school, housework, ambulation   PERSONAL FACTORS: See medical history and pertinent history     REHAB POTENTIAL: Good   CLINICAL DECISION MAKING: Stable/uncomplicated   EVALUATION COMPLEXITY: Low     GOALS:     SHORT TERM GOALS: Target date: 01/18/2022   Dylan [name] will be >75% HEP compliant to improve carryover between sessions and facilitate independent management of condition   Evaluation (12/21/2021): ongoing Goal status: INITIAL     LONG TERM GOALS: Target date: 02/15/2022   Dylan [name] will show a >/= 40 pt improvement in their KOOS Jr. score (MCID is 15 pts) as a proxy for functional improvement    Evaluation/Baseline (12/21/2021): KOOS, JR. Score: 21 / 28, Interval Score: 34.174 / 100 Goal status: INITIAL       2.  Dylan [name] will achieve 110 degrees knee flexion to improve ability to complete transfers and navigate steps   Evaluation/Baseline (12/21/2021): 50 degrees Goal status: INITIAL       3.  Dylan [name] will achieve 3 degrees knee extension to improve mechanics of gait   Evaluation/Baseline (12/21/2021): lacking 12 degrees Goal status: INITIAL       4.  Dylan [name] will be able to stand for >30'' in  SLS stance, to show a significant improvement in balance in order to reduce fall risk    Evaluation/Baseline (12/21/2021): tandem stance: unable Goal status: INITIAL     5.  Dylan [name] will be able to return to [employer] school, not limited by pain   Evaluation/Baseline (12/21/2021): limited Goal status: INITIAL       PLAN: PT FREQUENCY: 1-2x/week   PT DURATION: 8 weeks (Ending 02/15/2022)   PLANNED INTERVENTIONS: Therapeutic exercises, Aquatic therapy, Therapeutic activity, Neuro Muscular re-education, Gait training, Patient/Family education, Joint mobilization, Dry Needling, Electrical stimulation, Spinal mobilization and/or manipulation, Moist heat, Taping, Vasopneumatic device, Ionotophoresis 4mg /ml Dexamethasone, and Manual therapy   PLAN FOR NEXT SESSION: TKA progression as tolerated   Margarette Canada, PTA 12/27/2021, 9:47 AM

## 2021-12-27 ENCOUNTER — Ambulatory Visit: Payer: Medicaid Other

## 2021-12-27 DIAGNOSIS — M25562 Pain in left knee: Secondary | ICD-10-CM | POA: Diagnosis not present

## 2021-12-27 DIAGNOSIS — R2681 Unsteadiness on feet: Secondary | ICD-10-CM

## 2021-12-27 DIAGNOSIS — M6281 Muscle weakness (generalized): Secondary | ICD-10-CM

## 2021-12-27 DIAGNOSIS — R600 Localized edema: Secondary | ICD-10-CM

## 2021-12-28 ENCOUNTER — Ambulatory Visit (INDEPENDENT_AMBULATORY_CARE_PROVIDER_SITE_OTHER): Payer: Medicaid Other

## 2021-12-28 ENCOUNTER — Encounter: Payer: Self-pay | Admitting: Family

## 2021-12-28 ENCOUNTER — Ambulatory Visit (INDEPENDENT_AMBULATORY_CARE_PROVIDER_SITE_OTHER): Payer: Medicaid Other | Admitting: Family

## 2021-12-28 DIAGNOSIS — Z96652 Presence of left artificial knee joint: Secondary | ICD-10-CM

## 2021-12-28 MED ORDER — METHOCARBAMOL 500 MG PO TABS: 500.0000 mg | ORAL_TABLET | Freq: Four times a day (QID) | ORAL | 0 refills | Status: DC | PRN

## 2021-12-28 MED ORDER — OXYCODONE-ACETAMINOPHEN 5-325 MG PO TABS: 1.0000 | ORAL_TABLET | Freq: Four times a day (QID) | ORAL | 0 refills | Status: DC | PRN

## 2021-12-28 NOTE — Progress Notes (Signed)
Post-Op Visit Note   Patient: Dylan [name]           Date of Birth: November 29, 1965           MRN: [id number] Visit Date: 12/28/2021 PCP: Charlott Rakes, MD  Chief Complaint:  Chief Complaint  Patient presents with   Left Knee - Routine Post Op    12/14/2021 left total knee replacement     HPI:  HPI The patient is a 56 year old gentleman seen status post left total knee arthroplasty on December 6.  He has been working on his home exercise program with stretch bands using ice continues using a compression sock. Ortho Exam On examination of the left knee there are staples in place he does have moderate edema of his knee.  Lacks about 10 degrees full extension. Flex actively to 60  Visit Diagnoses:  1. Total knee replacement status, left     Plan: Continue physical therapy aggressive flexion follow-up in the office in 4 weeks.  Staples harvested today.  Follow-Up Instructions: No follow-ups on file.   Imaging: No results found.  Orders:  Orders Placed This Encounter  Procedures   XR Knee 1-2 Views Left   No orders of the defined types were placed in this encounter.    PMFS History: Patient Active Problem List   Diagnosis Date Noted   S/P total knee arthroplasty, left 12/14/2021   Unilateral primary osteoarthritis, left knee 12/14/2021   Total knee replacement status, left 12/14/2021   Intentional drug overdose ([place]) 11/26/2021   Chronic fatigue 11/16/2021   Abnormal EKG 11/16/2021   Overweight (BMI 25.0-29.9) 11/16/2021   Transaminitis 07/03/2021   Right foot ulcer ([place]) 07/03/2021   Onychomycosis 04/19/2021   Cocaine abuse ([place]) 03/16/2021   Bipolar I disorder, most recent episode depressed ([place]) 03/15/2021   Bipolar 1 disorder, depressed ([place]) 03/15/2021   Nodular radiologic density 02/02/2021   Lesion of liver 02/02/2021   Liver disease 02/02/2021   Vitamin D deficiency 01/18/2021   Alcohol abuse 01/17/2021   Methamphetamine abuse ([place]) 01/17/2021   Lesion  of pancreas 01/17/2021   Substance induced mood disorder ([place]) 10/26/2020   Fatty liver 03/09/2020   Schizophrenia ([place]) 03/08/2020   Generalized anxiety disorder 02/03/2020   Insomnia due to other mental disorder 02/03/2020   Lisfranc dislocation, right, initial encounter    Prediabetes 07/22/2019   Tobacco abuse 07/10/2018   Schizoaffective disorder, bipolar type ([place])    Acute renal failure (ARF) ([place])    Suicidal ideations    Chronic 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 coma ([place])    AKI (acute kidney injury) ([place]) 02/19/2017   Rhabdomyolysis 02/19/2017   Suicide attempt by acetaminophen overdose ([place]) 02/19/2017   Acute hepatitis 02/19/2017   Elevated LFTs    Schizoaffective disorder, depressive type ([place]) 09/05/2011   Degenerative disc disease 08/21/2011   HIV disease ([place]) 08/17/2011   Past Medical History:  Diagnosis Date   Anxiety    Arthritis    Bipolar 1 disorder (H[facility])    Colon polyps    Depression    GERD (gastroesophageal reflux disease)    Hepatitis B    Human immunodeficiency virus (HIV) ([place])    Hyperlipidemia    Hypertension    Neuromuscular disorder ([place])    neuropathy   Neuropathy    Pre-diabetes    Schizophrenia ([place])     Family History  Problem Relation Age of Onset   CAD Mother    Diabetes Father    Stroke  Father    Colon cancer Maternal Aunt 60   Diabetes Maternal Aunt    Heart disease Maternal Uncle    Heart attack Maternal Uncle    Esophageal cancer Neg Hx    Rectal cancer Neg Hx    Stomach cancer Neg Hx     Past Surgical History:  Procedure Laterality Date   FOOT ARTHRODESIS Right 09/17/2019   Procedure: FUSION RIGHT LISFRANC JOINT;  Surgeon: Newt Minion, MD;  Location: [place];  Service: Orthopedics;  Laterality: Right;   FOOT ARTHRODESIS Right 09/2019   HEMORROIDECTOMY     IR FLUORO GUIDE CV LINE RIGHT  02/22/2017   IR US GUIDE VASC ACCESS RIGHT  02/22/2017   TOTAL KNEE ARTHROPLASTY Left 12/14/2021   Procedure: LEFT  TOTAL KNEE ARTHROPLASTY;  Surgeon: Newt Minion, MD;  Location: [place];  Service: Orthopedics;  Laterality: Left;   TUMOR REMOVAL     From Chest   Social History   Occupational History   Not on file  Tobacco Use   Smoking status: Every Day    Packs/day: 0.50    Years: 25.00    Total pack years: 12.50    Types: E-cigarettes, Cigarettes   Smokeless tobacco: Never   Tobacco comments:    Vapes daily  Vaping Use   Vaping Use: Every day   Start date: 12/09/2020   Substances: Nicotine  Substance and Sexual Activity   Alcohol use: Yes    Comment: occassional drinker   Drug use: Not Currently    Types: Cocaine, Marijuana    Comment: Last used 1 month ago as of 12/12/21.   Sexual activity: Yes    Partners: Female

## 2021-12-28 NOTE — Telephone Encounter (Signed)
Pt called in stating that [facility] was suppose to send over muscle relaxer to his pharmacy.... Pt stated that the prescription wasn't sent over to pharmacy... Pt requesting callback.Marland KitchenMarland Kitchen

## 2021-12-29 ENCOUNTER — Ambulatory Visit: Payer: Medicaid Other | Admitting: Physical Therapy

## 2021-12-29 ENCOUNTER — Encounter: Payer: Self-pay | Admitting: Physical Therapy

## 2021-12-29 DIAGNOSIS — R600 Localized edema: Secondary | ICD-10-CM

## 2021-12-29 DIAGNOSIS — M25562 Pain in left knee: Secondary | ICD-10-CM

## 2021-12-29 DIAGNOSIS — M6281 Muscle weakness (generalized): Secondary | ICD-10-CM

## 2021-12-29 DIAGNOSIS — R2681 Unsteadiness on feet: Secondary | ICD-10-CM

## 2021-12-29 NOTE — Therapy (Signed)
OUTPATIENT PHYSICAL THERAPY TREATMENT NOTE   Patient Name: Dylan [name] MRN: [id number] DOB:16-Jun-1965, 56 y.o., male Today's Date: 12/29/2021  PCP: Charlott Rakes, MD  REFERRING PROVIDER: Newt Minion, MD   END OF SESSION:   PT End of Session - 12/29/21 0829     Visit Number 3    Date for PT Re-Evaluation 02/15/22    Authorization Type [place] [facility] - KOOS Jr.    PT Start Time 0830    PT Stop Time 0910    PT Time Calculation (min) 40 min    Activity Tolerance Patient tolerated treatment well;Patient limited by pain    Behavior During Therapy [facility] for tasks assessed/performed             Past Medical History:  Diagnosis Date   Anxiety    Arthritis    Bipolar 1 disorder ([place])    Colon polyps    Depression    GERD (gastroesophageal reflux disease)    Hepatitis B    Human immunodeficiency virus (HIV) ([place])    Hyperlipidemia    Hypertension    Neuromuscular disorder ([place])    neuropathy   Neuropathy    Pre-diabetes    Schizophrenia ([place])    Past Surgical History:  Procedure Laterality Date   FOOT ARTHRODESIS Right 09/17/2019   Procedure: FUSION RIGHT LISFRANC JOINT;  Surgeon: Newt Minion, MD;  Location: [place];  Service: Orthopedics;  Laterality: Right;   FOOT ARTHRODESIS Right 09/2019   HEMORROIDECTOMY     IR FLUORO GUIDE CV LINE RIGHT  02/22/2017   IR US GUIDE VASC ACCESS RIGHT  02/22/2017   TOTAL KNEE ARTHROPLASTY Left 12/14/2021   Procedure: LEFT TOTAL KNEE ARTHROPLASTY;  Surgeon: Newt Minion, MD;  Location: [place];  Service: Orthopedics;  Laterality: Left;   TUMOR REMOVAL     From Chest   Patient Active Problem List   Diagnosis Date Noted   S/P total knee arthroplasty, left 12/14/2021   Unilateral primary osteoarthritis, left knee 12/14/2021   Total knee replacement status, left 12/14/2021   Intentional drug overdose ([place]) 11/26/2021   Chronic fatigue 11/16/2021   Abnormal EKG 11/16/2021   Overweight (BMI 25.0-29.9) 11/16/2021   Transaminitis 07/03/2021    Right foot ulcer ([place]) 07/03/2021   Onychomycosis 04/19/2021   Cocaine abuse ([place]) 03/16/2021   Bipolar I disorder, most recent episode depressed ([place]) 03/15/2021   Bipolar 1 disorder, depressed ([place]) 03/15/2021   Nodular radiologic density 02/02/2021   Lesion of liver 02/02/2021   Liver disease 02/02/2021   Vitamin D deficiency 01/18/2021   Alcohol abuse 01/17/2021   Methamphetamine abuse ([place]) 01/17/2021   Lesion of pancreas 01/17/2021   Substance induced mood disorder ([place]) 10/26/2020   Fatty liver 03/09/2020   Schizophrenia ([place]) 03/08/2020   Generalized anxiety disorder 02/03/2020   Insomnia due to other mental disorder 02/03/2020   Lisfranc dislocation, right, initial encounter    Prediabetes 07/22/2019   Tobacco abuse 07/10/2018   Schizoaffective disorder, bipolar type ([place])    Acute renal failure (ARF) ([place])    Suicidal ideations    Chronic 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 coma ([place])    AKI (acute kidney injury) ([place]) 02/19/2017   Rhabdomyolysis 02/19/2017   Suicide attempt by acetaminophen overdose ([place]) 02/19/2017   Acute hepatitis 02/19/2017   Elevated LFTs    Schizoaffective disorder, depressive type ([place]) 09/05/2011   Degenerative disc disease 08/21/2011   HIV disease ([place]) 08/17/2011    REFERRING DIAG: Status post total  knee replacement, left [Z96.652]    THERAPY DIAG:  Acute pain of left knee  Muscle weakness  Unsteadiness on feet  Localized edema  Rationale for Evaluation and Treatment Rehabilitation  PERTINENT HISTORY: Hepatitis B, HIV, bipolar, substance abuse (cocaine, methamphetamine, EtOH), bipolar, schizoaffective disorder, chronic fatigue, Lisfranc dislocation R with arthrodesis 09/17/19; suicidal ideations, DDD, and arthritis.    PRECAUTIONS: Fall  SUBJECTIVE:                                                                                                                                                                                       SUBJECTIVE STATEMENT:  Pt reports that he is sore today.  He has been working on HEP at home.   PAIN:  Are you having pain? Yes Pain location: Diffuse L knee pain  NPRS scale:  6/10 to 7/10 Aggravating factors: activity but can occur without activity as well Relieving factors: Ice Pain description: intermittent, constant, sharp, and aching Stage: Acute Stability: getting better 24 hour pattern: NA    OBJECTIVE: (objective measures completed at initial evaluation unless otherwise dated)   DIAGNOSTIC FINDINGS:  NA             GENERAL OBSERVATION/GAIT:                     Slow antalgic gait using quad cane, reduced step length R reduced time in stance L, reduced swing L    SENSATION:          Light touch: Appears intact   PALPATION: Staples in place, wound appears well healing, no streaking or redness, moderate edema, calf supple and non-tender   MUSCLE LENGTH: Hamstrings: Right subtle restriction; Left significant restriction ASLR: Right ASLR = PSLR; Left ASLR significantly reduced compared to PSLR      LE MMT:   MMT Right 12/21/2021 Left 12/21/2021  Hip flexion (L2, L3) 4 3-  Knee extension (L3) 4+ 3-*  Knee flexion 4+ 3*  Hip abduction      Hip extension      Hip external rotation      Hip internal rotation      Hip adduction      Ankle dorsiflexion (L4)      Ankle plantarflexion (S1)      Ankle inversion      Ankle eversion      Great Toe ext (L5)      Grossly        (Blank rows = not tested, score listed is out of 5 possible points.  N = WNL, D = diminished, C = clear for gross weakness with myotome testing, * = concordant pain  with testing)   LE ROM:   ROM Right 12/21/2021 Left 12/21/2021 Left 12/27/21  Hip flexion       Hip extension       Hip abduction       Hip adduction       Hip internal rotation       Hip external rotation       Knee flexion Full >130 degrees 50 71  Knee extension 0 Lacking 12 Lacking 7  Ankle  dorsiflexion       Ankle plantarflexion       Ankle inversion       Ankle eversion         (Blank rows = not tested, N = WNL, * = concordant pain with testing)   Functional Tests   Eval (12/21/2021)      Progressive balance screen (highest level completed for >/= 10''):   Feet together: 10'' Semi Tandem: R in rear 10'', L in rear 10'' Tandem: R in rear unable, L in rear 4''        30'' STS: 5x  UE used? Y                                                                                                PATIENT SURVEYS:  Koos Jr. - KOOS, JR. Score: 21 / 28, Interval Score: 34.174 / 100     TODAY'S TREATMENT: Creating, reviewing, and completing below HEP     PATIENT EDUCATION:  POC, diagnosis, prognosis, HEP, and outcome measures.  Pt educated via explanation, demonstration, and handout (HEP).  Pt confirms understanding verbally.    HOME EXERCISE PROGRAM: Access Code: [email or web address] URL: [email or web address] Date: 12/29/2021 Prepared by: Shearon Balo  Exercises - Long Sitting 4 Way Patellar Glide  - 4 x daily - 7 x weekly - 3 sets - 10 reps - Ice  - 5 x daily - 7 x weekly - 1 sets - 1 reps - 20 min hold - Seated Knee Flexion Stretch  - 2 x daily - 7 x weekly - 1 sets - 4 reps - 1' hold - Seated Hamstring Stretch  - 2 x daily - 7 x weekly - 1 sets - 3 reps - 45 hold - Supine Active Straight Leg Raise  - 2 x daily - 7 x weekly - 3 sets - 10 reps - Long Sitting Quad Set with Towel Roll Under Heel  - 5 x daily - 7 x weekly - 3 sets - 10 reps   ASTERISK SIGNS     Asterisk Signs Eval (12/21/2021) 12/21           Knee flex 50  76          L knee ext Lacking 12  7          30'' STS 5x w/ UE            Balance Tandem unable bil                             TREATMENT 12/21: Therapeutic  Exercise: - Nustep level 2 seat 13 for ROM x 5 mins - Quad set 5" hold 2x10 Lt (towel under knee) - knee ext into [place] - 4x10 - knee flexion stretch on step -  2x20 - slant board stretch - 12'' x2 - heel raises - 3x10 - SLR with strap - 5x5  Manual: - AP mob grade IV - Flexion/extension ROM at EOT - quad set w/ op   ASSESSMENT:   CLINICAL IMPRESSION: Tc tolerated session well with no adverse reaction.  Concentration on knee ROM.  Pt progressing as expected.  Quad activation improved.  HEP updated.  Continue per POC.    OBJECTIVE IMPAIRMENTS: Pain, L knee ROM, L knee and hip strength, gait, balance   ACTIVITY LIMITATIONS: bending, squatting, transfers, school, housework, ambulation   PERSONAL FACTORS: See medical history and pertinent history     REHAB POTENTIAL: Good   CLINICAL DECISION MAKING: Stable/uncomplicated   EVALUATION COMPLEXITY: Low     GOALS:     SHORT TERM GOALS: Target date: 01/18/2022   [place] will be >75% HEP compliant to improve carryover between sessions and facilitate independent management of condition   Evaluation (12/21/2021): ongoing Goal status: INITIAL     LONG TERM GOALS: Target date: 02/15/2022   Ricard will show a >/= 40 pt improvement in their KOOS Jr. score (MCID is 15 pts) as a proxy for functional improvement    Evaluation/Baseline (12/21/2021): KOOS, JR. Score: 21 / 28, Interval Score: 34.174 / 100 Goal status: INITIAL       2.  [place] will achieve 110 degrees knee flexion to improve ability to complete transfers and navigate steps   Evaluation/Baseline (12/21/2021): 50 degrees Goal status: INITIAL       3.  [place] will achieve 3 degrees knee extension to improve mechanics of gait   Evaluation/Baseline (12/21/2021): lacking 12 degrees Goal status: INITIAL       4.  [place] will be able to stand for >30'' in SLS stance, to show a significant improvement in balance in order to reduce fall risk    Evaluation/Baseline (12/21/2021): tandem stance: unable Goal status: INITIAL     5.  [place] will be able to return to [employer] school, not limited by pain   Evaluation/Baseline  (12/21/2021): limited Goal status: INITIAL       PLAN: PT FREQUENCY: 1-2x/week   PT DURATION: 8 weeks (Ending 02/15/2022)   PLANNED INTERVENTIONS: Therapeutic exercises, Aquatic therapy, Therapeutic activity, Neuro Muscular re-education, Gait training, Patient/Family education, Joint mobilization, Dry Needling, Electrical stimulation, Spinal mobilization and/or manipulation, Moist heat, Taping, Vasopneumatic device, Ionotophoresis 4mg /ml Dexamethasone, and Manual therapy   PLAN FOR NEXT SESSION: TKA progression as tolerated   Mathis Dad, PT 12/29/2021, 9:14 AM

## 2022-01-04 ENCOUNTER — Encounter: Payer: Self-pay | Admitting: Family Medicine

## 2022-01-04 ENCOUNTER — Telehealth: Payer: Self-pay | Admitting: Family

## 2022-01-04 ENCOUNTER — Ambulatory Visit: Payer: Medicaid Other | Attending: Family Medicine | Admitting: Family Medicine

## 2022-01-04 VITALS — BP 138/85 | HR 87 | Temp 98.1°F | Ht 74.0 in | Wt 245.8 lb

## 2022-01-04 DIAGNOSIS — F250 Schizoaffective disorder, bipolar type: Secondary | ICD-10-CM | POA: Diagnosis not present

## 2022-01-04 DIAGNOSIS — K219 Gastro-esophageal reflux disease without esophagitis: Secondary | ICD-10-CM | POA: Insufficient documentation

## 2022-01-04 DIAGNOSIS — E782 Mixed hyperlipidemia: Secondary | ICD-10-CM | POA: Insufficient documentation

## 2022-01-04 DIAGNOSIS — Z96652 Presence of left artificial knee joint: Secondary | ICD-10-CM | POA: Insufficient documentation

## 2022-01-04 DIAGNOSIS — R935 Abnormal findings on diagnostic imaging of other abdominal regions, including retroperitoneum: Secondary | ICD-10-CM

## 2022-01-04 DIAGNOSIS — I10 Essential (primary) hypertension: Secondary | ICD-10-CM | POA: Diagnosis not present

## 2022-01-04 DIAGNOSIS — Z21 Asymptomatic human immunodeficiency virus [HIV] infection status: Secondary | ICD-10-CM | POA: Insufficient documentation

## 2022-01-04 DIAGNOSIS — E876 Hypokalemia: Secondary | ICD-10-CM | POA: Diagnosis not present

## 2022-01-04 DIAGNOSIS — B20 Human immunodeficiency virus [HIV] disease: Secondary | ICD-10-CM

## 2022-01-04 MED ORDER — OXYCODONE-ACETAMINOPHEN 5-325 MG PO TABS: 1.0000 | ORAL_TABLET | Freq: Three times a day (TID) | ORAL | 0 refills | Status: DC | PRN

## 2022-01-04 MED ORDER — OMEPRAZOLE 40 MG PO CPDR: 40.0000 mg | DELAYED_RELEASE_CAPSULE | Freq: Every day | ORAL | 3 refills | Status: DC

## 2022-01-04 NOTE — Patient Instructions (Signed)
Dyslipidemia Dyslipidemia is an imbalance of waxy, fat-like substances (lipids) in the blood. The body needs lipids in small amounts. Dyslipidemia often involves a high level of cholesterol or triglycerides, which are types of lipids. Common forms of dyslipidemia include: High levels of LDL cholesterol. LDL is the type of cholesterol that causes fatty deposits (plaques) to build up in the blood vessels that carry blood away from the heart (arteries). Low levels of HDL cholesterol. HDL cholesterol is the type of cholesterol that protects against heart disease. High levels of HDL remove the LDL buildup from arteries. High levels of triglycerides. Triglycerides are a fatty substance in the blood that is linked to a buildup of plaques in the arteries. What are the causes? There are two main types of dyslipidemia: primary and secondary. Primary dyslipidemia is caused by changes (mutations) in genes that are passed down through families (inherited). These mutations cause several types of dyslipidemia. Secondary dyslipidemia may be caused by various risk factors that can lead to the disease, such as lifestyle choices and certain medical conditions. What increases the risk? You are more likely to develop this condition if you are an older man or if you are a woman who has gone through menopause. Other risk factors include: Having a family history of dyslipidemia. Taking certain medicines, including birth control pills, steroids, some diuretics, and beta-blockers. Eating a diet high in saturated fat. Smoking cigarettes or excessive alcohol intake. Having certain medical conditions such as diabetes, polycystic ovary syndrome (PCOS), kidney disease, liver disease, or hypothyroidism. Not exercising regularly. Being overweight or obese with too much belly fat. What are the signs or symptoms? In most cases, dyslipidemia does not usually cause any symptoms. In severe cases, very high lipid levels can  cause: Fatty bumps under the skin (xanthomas). A white or gray ring around the black center (pupil) of the eye. Very high triglyceride levels can cause inflammation of the pancreas (pancreatitis). How is this diagnosed? Your health care provider may diagnose dyslipidemia based on a routine blood test (fasting blood test). Because most people do not have symptoms of the condition, this blood testing (lipid profile) is done on adults age 20 and older and is repeated every 4-6 years. This test checks: Total cholesterol. This measures the total amount of cholesterol in your blood, including LDL cholesterol, HDL cholesterol, and triglycerides. A healthy number is below 200 mg/dL (5.17 mmol/L). LDL cholesterol. The target number for LDL cholesterol is different for each person, depending on individual risk factors. A healthy number is usually below 100 mg/dL (2.59 mmol/L). Ask your health care provider what your LDL cholesterol should be. HDL cholesterol. An HDL level of 60 mg/dL (1.55 mmol/L) or higher is best because it helps to protect against heart disease. A number below 40 mg/dL (1.03 mmol/L) for men or below 50 mg/dL (1.29 mmol/L) for women increases the risk for heart disease. Triglycerides. A healthy triglyceride number is below 150 mg/dL (1.69 mmol/L). If your lipid profile is abnormal, your health care provider may do other blood tests. How is this treated? Treatment depends on the type of dyslipidemia that you have and your other risk factors for heart disease and stroke. Your health care provider will have a target range for your lipid levels based on this information. Treatment for dyslipidemia starts with lifestyle changes, such as diet and exercise. Your health care provider may recommend that you: Get regular exercise. Make changes to your diet. Quit smoking if you smoke. Limit your alcohol intake. If diet   changes and exercise do not help you reach your goals, your health care provider  may also prescribe medicine to lower lipids. The most commonly prescribed type of medicine lowers your LDL cholesterol (statin drug). If you have a high triglyceride level, your provider may prescribe another type of drug (fibrate) or an omega-3 fish oil supplement, or both. Follow these instructions at home: Eating and drinking  Follow instructions from your health care provider or dietitian about eating or drinking restrictions. Eat a healthy diet as told by your health care provider. This can help you reach and maintain a healthy weight, lower your LDL cholesterol, and raise your HDL cholesterol. This may include: Limiting your calories, if you are overweight. Eating more fruits, vegetables, whole grains, fish, and lean meats. Limiting saturated fat, trans fat, and cholesterol. Do not drink alcohol if: Your health care provider tells you not to drink. You are pregnant, may be pregnant, or are planning to become pregnant. If you drink alcohol: Limit how much you have to: 0-1 drink a day for women. 0-2 drinks a day for men. Know how much alcohol is in your drink. In the [place] one drink equals one 12 oz bottle of beer (355 mL), one 5 oz glass of wine (148 mL), or one 1 oz glass of hard liquor (44 mL). Activity Get regular exercise. Start an exercise and strength training program as told by your health care provider. Ask your health care provider what activities are safe for you. Your health care provider may recommend: 30 minutes of aerobic activity 4-6 days a week. Brisk walking is an example of aerobic activity. Strength training 2 days a week. General instructions Do not use any products that contain nicotine or tobacco. These products include cigarettes, chewing tobacco, and vaping devices, such as e-cigarettes. If you need help quitting, ask your health care provider. Take over-the-counter and prescription medicines only as told by your health care provider. This includes  supplements. Keep all follow-up visits. This is important. Contact a health care provider if: You are having trouble sticking to your exercise or diet plan. You are struggling to quit smoking or to control your use of alcohol. Summary Dyslipidemia often involves a high level of cholesterol or triglycerides, which are types of lipids. Treatment depends on the type of dyslipidemia that you have and your other risk factors for heart disease and stroke. Treatment for dyslipidemia starts with lifestyle changes, such as diet and exercise. Your health care provider may prescribe medicine to lower lipids. This information is not intended to replace advice given to you by your health care provider. Make sure you discuss any questions you have with your health care provider. Document Revised: 07/29/2021 Document Reviewed: 03/01/2020 Elsevier Patient Education  2023 [employer]  

## 2022-01-04 NOTE — Progress Notes (Signed)
Subjective:  Patient ID: Dylan [name], male    DOB: Nov 24, 1965  Age: 56 y.o. MRN: [id number]  CC: Hospitalization Follow-up   HPI Dylan [name] is a 56 y.o. year old male with a history of HIV (on antiretroviral therapy), hypertension, schizoaffective disorder, substance abuse, Status post left TKA in 12/2021.  Interval History: Last visit with orthopedics was on 12/28/2021.  He has run out of his pain medication and has sent them a message for refill. Currently undergoing PT.  He Complains of acid reflux and he was previously on a PPI but symptoms improved and he discontinued it but now symptoms have returned.  He has no abdominal pain, no nausea or vomiting.  [facility] manages his Schizoaffective disorder and he is doing well on his psychotropic medications. He is due for repeat follow-up CT of the abdomen to evaluate for previously seen lesion anterior to caudate lobe of the liver in MRI from 02/2021.Marland Kitchen Past Medical History:  Diagnosis Date   Anxiety    Arthritis    Bipolar 1 disorder ([place])    Colon polyps    Depression    GERD (gastroesophageal reflux disease)    Hepatitis B    Human immunodeficiency virus (HIV) ([place])    Hyperlipidemia    Hypertension    Neuromuscular disorder ([place])    neuropathy   Neuropathy    Pre-diabetes    Schizophrenia ([place])     Past Surgical History:  Procedure Laterality Date   FOOT ARTHRODESIS Right 09/17/2019   Procedure: FUSION RIGHT LISFRANC JOINT;  Surgeon: Newt Minion, MD;  Location: [place];  Service: Orthopedics;  Laterality: Right;   FOOT ARTHRODESIS Right 09/2019   HEMORROIDECTOMY     IR FLUORO GUIDE CV LINE RIGHT  02/22/2017   IR US GUIDE VASC ACCESS RIGHT  02/22/2017   TOTAL KNEE ARTHROPLASTY Left 12/14/2021   Procedure: LEFT TOTAL KNEE ARTHROPLASTY;  Surgeon: Newt Minion, MD;  Location: [place];  Service: Orthopedics;  Laterality: Left;   TUMOR REMOVAL     From Chest    Family History  Problem Relation Age of Onset   CAD Mother     Diabetes Father    Stroke Father    Colon cancer Maternal Aunt 60   Diabetes Maternal Aunt    Heart disease Maternal Uncle    Heart attack Maternal Uncle    Esophageal cancer Neg Hx    Rectal cancer Neg Hx    Stomach cancer Neg Hx     Social History   Socioeconomic History   Marital status: Married    Spouse name: Not on file   Number of children: 1   Years of education: Not on file   Highest education level: Not on file  Occupational History   Not on file  Tobacco Use   Smoking status: Every Day    Packs/day: 0.50    Years: 25.00    Total pack years: 12.50    Types: E-cigarettes, Cigarettes   Smokeless tobacco: Never   Tobacco comments:    Vapes daily  Vaping Use   Vaping Use: Every day   Start date: 12/09/2020   Substances: Nicotine  Substance and Sexual Activity   Alcohol use: Yes    Comment: occassional drinker   Drug use: Not Currently    Types: Cocaine, Marijuana    Comment: Last used 1 month ago as of 12/12/21.   Sexual activity: Yes    Partners: Female  Other Topics Concern   Not on  file  Social History Narrative   Lives home alone.    Social Determinants of Health   Financial Resource Strain: Not on file  Food Insecurity: Food Insecurity Present (12/14/2021)   Hunger Vital Sign    Worried About Running Out of Food in the Last Year: Sometimes true    Ran Out of Food in the Last Year: Sometimes true  Transportation Needs: Unmet Transportation Needs (12/14/2021)   PRAPARE - Hydrologist (Medical): Yes    Lack of Transportation (Non-Medical): Yes  Physical Activity: Not on file  Stress: Not on file  Social Connections: Not on file    Allergies  Allergen Reactions   Penicillins Other (See Comments)    Childhood allergy   Pork-Derived Products Other (See Comments)    Patient preference   Latex Rash   Tape Rash    Outpatient Medications Prior to Visit  Medication Sig Dispense Refill   ALEVE 220 MG tablet Take  220-440 mg by mouth 2 (two) times daily as needed (for pain).     bictegravir-emtricitabine-tenofovir AF (BIKTARVY) 50-200-25 MG TABS tablet Take 1 tablet by mouth daily. 30 tablet 11   diclofenac Sodium (VOLTAREN) 1 % GEL Apply 2-4 g topically 3 (three) times daily as needed (right foot and ankle pain).     gabapentin (NEURONTIN) 300 MG capsule Take 2 capsules (600 mg total) by mouth 3 (three) times daily. For pain 180 capsule 0   hydrOXYzine (ATARAX) 25 MG tablet Take 1 tablet (25 mg total) by mouth 3 (three) times daily as needed for anxiety. 75 tablet 0   methocarbamol (ROBAXIN) 500 MG tablet Take 1 tablet (500 mg total) by mouth every 6 (six) hours as needed for muscle spasms. 30 tablet 0   QUEtiapine Fumarate 150 MG TABS Take 150 mg by mouth at bedtime. For mood control 30 tablet 0   rosuvastatin (CRESTOR) 20 MG tablet Take 1 tablet (20 mg total) by mouth daily. 90 tablet 3   traZODone (DESYREL) 150 MG tablet Take 1 tablet (150 mg total) by mouth at bedtime. For sleep (Patient taking differently: Take 150 mg by mouth at bedtime as needed for sleep.) 30 tablet 0   triamcinolone ointment (KENALOG) 0.1 % Apply topically 2 (two) times daily. For fungal growth to right toes. 30 g 0   oxyCODONE-acetaminophen (PERCOCET/ROXICET) 5-325 MG tablet Take 1 tablet by mouth every 6 (six) hours as needed. (Patient not taking: Reported on 01/04/2022) 30 tablet 0   No facility-administered medications prior to visit.     ROS Review of Systems  Constitutional:  Negative for activity change and appetite change.  HENT:  Negative for sinus pressure and sore throat.   Respiratory:  Negative for chest tightness, shortness of breath and wheezing.   Cardiovascular:  Negative for chest pain and palpitations.  Gastrointestinal:  Negative for abdominal distention, abdominal pain and constipation.  Genitourinary: Negative.   Musculoskeletal:        See HPI  Psychiatric/Behavioral:  Negative for behavioral problems  and dysphoric mood.     Objective:  BP 138/85   Pulse 87   Temp 98.1 F (36.7 C) (Oral)   Ht 6\' 2"  (1.88 m)   Wt 245 lb 12.8 oz (111.5 kg)   SpO2 97%   BMI 31.56 kg/m      01/04/2022   10:00 AM 12/16/2021   12:26 PM 12/16/2021    8:47 AM  BP/Weight  Systolic BP 536 144 315  Diastolic BP  85 89 83  Wt. (Lbs) 245.8    BMI 31.56 kg/m2        Physical Exam Constitutional:      Appearance: He is well-developed.  Cardiovascular:     Rate and Rhythm: Normal rate.     Heart sounds: Normal heart sounds. No murmur heard. Pulmonary:     Effort: Pulmonary effort is normal.     Breath sounds: Normal breath sounds. No wheezing or rales.  Chest:     Chest wall: No tenderness.  Abdominal:     General: Bowel sounds are normal. There is no distension.     Palpations: Abdomen is soft. There is no mass.     Tenderness: There is no abdominal tenderness.  Musculoskeletal:     Right lower leg: No edema.     Left lower leg: Edema present.     Comments: Decreased range of motion of left lower extremity  Neurological:     Mental Status: He is alert and oriented to person, place, and time.  Psychiatric:        Mood and Affect: Mood normal.        Latest Ref Rng & Units 11/26/2021   11:51 AM 11/08/2021   10:08 AM 10/28/2021    5:40 PM  CMP  Glucose 70 - 99 mg/dL 82  96  104   BUN 6 - 20 mg/dL 15  19  18    Creatinine 0.61 - 1.24 mg/dL 0.64  1.08  1.09   Sodium 135 - 145 mmol/L 140  139  138   Potassium 3.5 - 5.1 mmol/L 3.2  4.4  3.7   Chloride 98 - 111 mmol/L 114  106  101   CO2 22 - 32 mmol/L 23  28  23    Calcium 8.9 - 10.3 mg/dL 7.4  9.3  10.0   Total Protein 6.5 - 8.1 g/dL 6.5  7.2    Total Bilirubin 0.3 - 1.2 mg/dL 0.8  0.3    Alkaline Phos 38 - 126 U/L 65     AST 15 - 41 U/L 41  28    ALT 0 - 44 U/L 54  48      Lipid Panel     Component Value Date/Time   CHOL 244 (H) 08/28/2021 0809   CHOL 158 02/13/2019 1128   TRIG 65 08/28/2021 0809   HDL 51 08/28/2021 0809    HDL 41 02/13/2019 1128   CHOLHDL 4.8 08/28/2021 0809   VLDL 13 08/28/2021 0809   LDLCALC 180 (H) 08/28/2021 0809   LDLCALC 97 02/13/2019 1128   LDLCALC 149 (H) 06/06/2018 1406    CBC    Component Value Date/Time   WBC 7.0 11/26/2021 1151   RBC 4.56 11/26/2021 1151   HGB 14.0 11/26/2021 1151   HCT 42.4 11/26/2021 1151   PLT 184 11/26/2021 1151   MCV 93.0 11/26/2021 1151   MCH 30.7 11/26/2021 1151   MCHC 33.0 11/26/2021 1151   RDW 13.2 11/26/2021 1151   LYMPHSABS 1,880 11/08/2021 1008   MONOABS 0.9 08/28/2021 0809   EOSABS 160 11/08/2021 1008   BASOSABS 19 11/08/2021 1008    Lab Results  Component Value Date   HGBA1C 5.8 (H) 09/01/2021    Assessment & Plan:  1. Gastroesophageal reflux disease without esophagitis Uncontrolled Restarted PPI - omeprazole (PRILOSEC) 40 MG capsule; Take 1 capsule (40 mg total) by mouth daily.  Dispense: 30 capsule; Refill: 3  2. Abnormal MRI of abdomen Follow-up CT scan ordered for  lesion anterior to caudate lobe of liver - CT Abdomen Pelvis W Contrast; Future  3. Hypokalemia Last potassium was 3.2 Will recheck - Basic Metabolic Panel  4. Mixed hyperlipidemia Uncontrolled He is currently on a statin Repeat lipid panel at next visit  5. Schizoaffective disorder, bipolar type ([place]) Stable Management as per behavioral health  6. HIV disease ([place]) Continue antiretroviral therapy per infectious disease    Meds ordered this encounter  Medications   omeprazole (PRILOSEC) 40 MG capsule    Sig: Take 1 capsule (40 mg total) by mouth daily.    Dispense:  30 capsule    Refill:  3    Follow-up: Return in about 6 months (around 07/06/2022) for Chronic medical conditions.       Charlott Rakes, MD, FAAFP. [facility] [place], [place]   01/04/2022, 12:53 PM

## 2022-01-04 NOTE — Progress Notes (Signed)
HFU Acid reflux. Discuss pain medication for knee.

## 2022-01-04 NOTE — Telephone Encounter (Signed)
Erin refilled oxycodone today for pt.

## 2022-01-04 NOTE — Telephone Encounter (Signed)
Patient need pain medication refill..Oxycodone 5/325 mg

## 2022-01-05 ENCOUNTER — Ambulatory Visit: Payer: Medicaid Other

## 2022-01-05 LAB — BASIC METABOLIC PANEL
BUN/Creatinine Ratio: 12 (ref 9–20)
BUN: 13 mg/dL (ref 6–24)
CO2: 23 mmol/L (ref 20–29)
Calcium: 10.1 mg/dL (ref 8.7–10.2)
Chloride: 99 mmol/L (ref 96–106)
Creatinine, Ser: 1.06 mg/dL (ref 0.76–1.27)
Glucose: 112 mg/dL — ABNORMAL HIGH (ref 70–99)
Potassium: 4.4 mmol/L (ref 3.5–5.2)
Sodium: 138 mmol/L (ref 134–144)
eGFR: 82 mL/min/{1.73_m2} (ref >59)

## 2022-01-08 ENCOUNTER — Other Ambulatory Visit: Payer: Self-pay | Admitting: Internal Medicine

## 2022-01-08 DIAGNOSIS — F411 Generalized anxiety disorder: Secondary | ICD-10-CM

## 2022-01-10 ENCOUNTER — Ambulatory Visit: Payer: Medicaid Other | Attending: Orthopedic Surgery

## 2022-01-10 DIAGNOSIS — M25562 Pain in left knee: Secondary | ICD-10-CM | POA: Diagnosis not present

## 2022-01-10 DIAGNOSIS — M6281 Muscle weakness (generalized): Secondary | ICD-10-CM | POA: Diagnosis present

## 2022-01-10 DIAGNOSIS — R2681 Unsteadiness on feet: Secondary | ICD-10-CM | POA: Insufficient documentation

## 2022-01-10 DIAGNOSIS — R600 Localized edema: Secondary | ICD-10-CM | POA: Diagnosis present

## 2022-01-10 NOTE — Therapy (Signed)
OUTPATIENT PHYSICAL THERAPY TREATMENT NOTE   Patient Name: Dylan [name] MRN: [id number] DOB:01/03/1966, 57 y.o., male Today's Date: 01/10/2022  PCP: Charlott Rakes, MD  REFERRING PROVIDER: Newt Minion, MD   END OF SESSION:   PT End of Session - 01/10/22 1434     Visit Number 4    Date for PT Re-Evaluation 02/15/22    Authorization Type [place] [facility] - KOOS Jr.    PT Start Time 1440    PT Stop Time 1520    PT Time Calculation (min) 40 min    Activity Tolerance Patient tolerated treatment well;Patient limited by pain    Behavior During Therapy [facility] for tasks assessed/performed              Past Medical History:  Diagnosis Date   Anxiety    Arthritis    Bipolar 1 disorder ([place])    Colon polyps    Depression    GERD (gastroesophageal reflux disease)    Hepatitis B    Human immunodeficiency virus (HIV) ([place])    Hyperlipidemia    Hypertension    Neuromuscular disorder ([place])    neuropathy   Neuropathy    Pre-diabetes    Schizophrenia ([place])    Past Surgical History:  Procedure Laterality Date   FOOT ARTHRODESIS Right 09/17/2019   Procedure: FUSION RIGHT LISFRANC JOINT;  Surgeon: Newt Minion, MD;  Location: [place];  Service: Orthopedics;  Laterality: Right;   FOOT ARTHRODESIS Right 09/2019   HEMORROIDECTOMY     IR FLUORO GUIDE CV LINE RIGHT  02/22/2017   IR US GUIDE VASC ACCESS RIGHT  02/22/2017   TOTAL KNEE ARTHROPLASTY Left 12/14/2021   Procedure: LEFT TOTAL KNEE ARTHROPLASTY;  Surgeon: Newt Minion, MD;  Location: [place];  Service: Orthopedics;  Laterality: Left;   TUMOR REMOVAL     From Chest   Patient Active Problem List   Diagnosis Date Noted   S/P total knee arthroplasty, left 12/14/2021   Unilateral primary osteoarthritis, left knee 12/14/2021   Total knee replacement status, left 12/14/2021   Intentional drug overdose ([place]) 11/26/2021   Chronic fatigue 11/16/2021   Abnormal EKG 11/16/2021   Overweight (BMI 25.0-29.9) 11/16/2021   Transaminitis 07/03/2021    Right foot ulcer ([place]) 07/03/2021   Onychomycosis 04/19/2021   Cocaine abuse ([place]) 03/16/2021   Bipolar I disorder, most recent episode depressed ([place]) 03/15/2021   Bipolar 1 disorder, depressed ([place]) 03/15/2021   Nodular radiologic density 02/02/2021   Lesion of liver 02/02/2021   Liver disease 02/02/2021   Vitamin D deficiency 01/18/2021   Alcohol abuse 01/17/2021   Methamphetamine abuse ([place]) 01/17/2021   Lesion of pancreas 01/17/2021   Substance induced mood disorder ([place]) 10/26/2020   Fatty liver 03/09/2020   Schizophrenia ([place]) 03/08/2020   Generalized anxiety disorder 02/03/2020   Insomnia due to other mental disorder 02/03/2020   Lisfranc dislocation, right, initial encounter    Prediabetes 07/22/2019   Tobacco abuse 07/10/2018   Schizoaffective disorder, bipolar type ([place])    Acute renal failure (ARF) ([place])    Suicidal ideations    Chronic 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 coma ([place])    AKI (acute kidney injury) ([place]) 02/19/2017   Rhabdomyolysis 02/19/2017   Suicide attempt by acetaminophen overdose ([place]) 02/19/2017   Acute hepatitis 02/19/2017   Elevated LFTs    Schizoaffective disorder, depressive type ([place]) 09/05/2011   Degenerative disc disease 08/21/2011   HIV disease ([place]) 08/17/2011    REFERRING DIAG: Status post  total knee replacement, left [Z96.652]    THERAPY DIAG:  Acute pain of left knee  Muscle weakness  Unsteadiness on feet  Localized edema  Rationale for Evaluation and Treatment Rehabilitation  PERTINENT HISTORY: Hepatitis B, HIV, bipolar, substance abuse (cocaine, methamphetamine, EtOH), bipolar, schizoaffective disorder, chronic fatigue, Lisfranc dislocation R with arthrodesis 09/17/19; suicidal ideations, DDD, and arthritis.    PRECAUTIONS: Fall  SUBJECTIVE:                                                                                                                                                                                       SUBJECTIVE STATEMENT: Patient reports that he hasn't stretched in 2 days due to increased pain, but that he has been walking more.    PAIN:  Are you having pain? Yes Pain location: Diffuse L knee pain  NPRS scale:  7/10 Aggravating factors: activity but can occur without activity as well Relieving factors: Ice Pain description: intermittent, constant, sharp, and aching Stage: Acute Stability: getting better 24 hour pattern: NA    OBJECTIVE: (objective measures completed at initial evaluation unless otherwise dated)   DIAGNOSTIC FINDINGS:  NA             GENERAL OBSERVATION/GAIT:                     Slow antalgic gait using quad cane, reduced step length R reduced time in stance L, reduced swing L    SENSATION:          Light touch: Appears intact   PALPATION: Staples in place, wound appears well healing, no streaking or redness, moderate edema, calf supple and non-tender   MUSCLE LENGTH: Hamstrings: Right subtle restriction; Left significant restriction ASLR: Right ASLR = PSLR; Left ASLR significantly reduced compared to PSLR      LE MMT:   MMT Right 12/21/2021 Left 12/21/2021  Hip flexion (L2, L3) 4 3-  Knee extension (L3) 4+ 3-*  Knee flexion 4+ 3*  Hip abduction      Hip extension      Hip external rotation      Hip internal rotation      Hip adduction      Ankle dorsiflexion (L4)      Ankle plantarflexion (S1)      Ankle inversion      Ankle eversion      Great Toe ext (L5)      Grossly        (Blank rows = not tested, score listed is out of 5 possible points.  N = WNL, D = diminished, C = clear for gross weakness with myotome testing, * =  concordant pain with testing)   LE ROM:   ROM Right 12/21/2021 Left 12/21/2021 Left 12/27/21 Left 01/10/22  Hip flexion        Hip extension        Hip abduction        Hip adduction        Hip internal rotation        Hip external rotation        Knee flexion Full >130 degrees 50 71 90  Knee  extension 0 Lacking 12 Lacking 7 Lacking 6  Ankle dorsiflexion        Ankle plantarflexion        Ankle inversion        Ankle eversion          (Blank rows = not tested, N = WNL, * = concordant pain with testing)   Functional Tests   Eval (12/21/2021)      Progressive balance screen (highest level completed for >/= 10''):   Feet together: 10'' Semi Tandem: R in rear 10'', L in rear 10'' Tandem: R in rear unable, L in rear 4''        30'' STS: 5x  UE used? Y                                                                                                PATIENT SURVEYS:  Koos Jr. - KOOS, JR. Score: 21 / 28, Interval Score: 34.174 / 100     TODAY'S TREATMENT: Creating, reviewing, and completing below HEP     PATIENT EDUCATION:  POC, diagnosis, prognosis, HEP, and outcome measures.  Pt educated via explanation, demonstration, and handout (HEP).  Pt confirms understanding verbally.    HOME EXERCISE PROGRAM: Access Code: [email or web address] URL: [email or web address] Date: 12/29/2021 Prepared by: Shearon Balo  Exercises - Long Sitting 4 Way Patellar Glide  - 4 x daily - 7 x weekly - 3 sets - 10 reps - Ice  - 5 x daily - 7 x weekly - 1 sets - 1 reps - 20 min hold - Seated Knee Flexion Stretch  - 2 x daily - 7 x weekly - 1 sets - 4 reps - 1' hold - Seated Hamstring Stretch  - 2 x daily - 7 x weekly - 1 sets - 3 reps - 45 hold - Supine Active Straight Leg Raise  - 2 x daily - 7 x weekly - 3 sets - 10 reps - Long Sitting Quad Set with Towel Roll Under Heel  - 5 x daily - 7 x weekly - 3 sets - 10 reps   ASTERISK SIGNS     Asterisk Signs Eval (12/21/2021) 12/21  01/10/22         Knee flex 50  76  90        L knee ext Lacking _0 30'' STS 5x w/ UE            Balance Tandem unable bil  TREATMENT 01/10/22: Therapeutic Exercise: - Nustep level 2 seat 13 for ROM x 5 mins - Quad set 5" hold 2x10 Lt (towel under  knee) - knee flexion stretch on step - 2x20 - slant board stretch - x2' - heel raises - 3x10 - SLR x10 Lt (slight quad lag) - SLR with strap - 5x5 - Heel slide with strap 5" hold at end range x10 - LLLD knee extension stretch heel on 1/2 foam roll, 4# above knee Manual: - AP mob grade IV - Flexion/extension ROM at EOT   TREATMENT 12/21: Therapeutic Exercise: - Nustep level 2 seat 13 for ROM x 5 mins - Quad set 5" hold 2x10 Lt (towel under knee) - knee ext into [place] - 4x10 - knee flexion stretch on step - 2x20 - slant board stretch - 72'' x2 - heel raises - 3x10 - SLR with strap - 5x5  Manual: - AP mob grade IV - Flexion/extension ROM at EOT - quad set w/ op   ASSESSMENT:   CLINICAL IMPRESSION: Patient presents to PT with increased soreness in his Lt knee today, stating that he's been doing a lot of walking recently. Session today continued to focus on Lt knee ROM and quadriceps activation and strengthening. His ROM has improved to 6-90 today. Patient was able to tolerate all prescribed exercises with no adverse effects. Patient continues to benefit from skilled PT services and should be progressed as able to improve functional independence.     OBJECTIVE IMPAIRMENTS: Pain, L knee ROM, L knee and hip strength, gait, balance   ACTIVITY LIMITATIONS: bending, squatting, transfers, school, housework, ambulation   PERSONAL FACTORS: See medical history and pertinent history     REHAB POTENTIAL: Good   CLINICAL DECISION MAKING: Stable/uncomplicated   EVALUATION COMPLEXITY: Low     GOALS:     SHORT TERM GOALS: Target date: 01/18/2022   Joud will be >75% HEP compliant to improve carryover between sessions and facilitate independent management of condition   Evaluation (12/21/2021): ongoing Goal status: MET Pt reports adherence 01/10/22     LONG TERM GOALS: Target date: 02/15/2022   Nasiir will show a >/= 40 pt improvement in their KOOS Jr. score (MCID is 15 pts) as  a proxy for functional improvement    Evaluation/Baseline (12/21/2021): KOOS, JR. Score: 21 / 28, Interval Score: 34.174 / 100 Goal status: Progressing       2.  Vyncent will achieve 110 degrees knee flexion to improve ability to complete transfers and navigate steps   Evaluation/Baseline (12/21/2021): 50 degrees Goal status: Progressing       3.  Uel will achieve 3 degrees knee extension to improve mechanics of gait   Evaluation/Baseline (12/21/2021): lacking 12 degrees Goal status: Progressing       4.  Boden will be able to stand for >30'' in SLS stance, to show a significant improvement in balance in order to reduce fall risk    Evaluation/Baseline (12/21/2021): tandem stance: unable Goal status: Progressing     5.  Qamar will be able to return to [employer] school, not limited by pain   Evaluation/Baseline (12/21/2021): limited Goal status: Progressing       PLAN: PT FREQUENCY: 1-2x/week   PT DURATION: 8 weeks (Ending 02/15/2022)   PLANNED INTERVENTIONS: Therapeutic exercises, Aquatic therapy, Therapeutic activity, Neuro Muscular re-education, Gait training, Patient/Family education, Joint mobilization, Dry Needling, Electrical stimulation, Spinal mobilization and/or manipulation, Moist heat, Taping, Vasopneumatic device, Ionotophoresis 64m/ml Dexamethasone, and Manual therapy   PLAN FOR NEXT  SESSION: TKA progression as tolerated   Margarette Canada, PTA 01/10/2022, 2:34 PM

## 2022-01-12 ENCOUNTER — Ambulatory Visit: Payer: Medicaid Other

## 2022-01-12 DIAGNOSIS — R2681 Unsteadiness on feet: Secondary | ICD-10-CM

## 2022-01-12 DIAGNOSIS — R600 Localized edema: Secondary | ICD-10-CM

## 2022-01-12 DIAGNOSIS — M25562 Pain in left knee: Secondary | ICD-10-CM

## 2022-01-12 DIAGNOSIS — M6281 Muscle weakness (generalized): Secondary | ICD-10-CM

## 2022-01-12 NOTE — Therapy (Signed)
OUTPATIENT PHYSICAL THERAPY TREATMENT NOTE   Patient Name: Dylan [name] MRN: [id number] DOB:12/22/1965, 57 y.o., male Today's Date: 01/12/2022  PCP: Charlott Rakes, MD  REFERRING PROVIDER: Newt Minion, MD   END OF SESSION:   PT End of Session - 01/12/22 1514     Visit Number 5    Date for PT Re-Evaluation 02/15/22    Authorization Type [place] M[facility] - KOOS Jr.    PT Start Time 4098    PT Stop Time 1610    PT Time Calculation (min) 40 min    Activity Tolerance Patient tolerated treatment well    Behavior During Therapy [facility] for tasks assessed/performed               Past Medical History:  Diagnosis Date   Anxiety    Arthritis    Bipolar 1 disorder ([place])    Colon polyps    Depression    GERD (gastroesophageal reflux disease)    Hepatitis B    Human immunodeficiency virus (HIV) ([place])    Hyperlipidemia    Hypertension    Neuromuscular disorder ([place])    neuropathy   Neuropathy    Pre-diabetes    Schizophrenia ([name])    Past Surgical History:  Procedure Laterality Date   FOOT ARTHRODESIS Right 09/17/2019   Procedure: FUSION RIGHT LISFRANC JOINT;  Surgeon: Newt Minion, MD;  Location: [place];  Service: Orthopedics;  Laterality: Right;   FOOT ARTHRODESIS Right 09/2019   HEMORROIDECTOMY     IR FLUORO GUIDE CV LINE RIGHT  02/22/2017   IR US GUIDE VASC ACCESS RIGHT  02/22/2017   TOTAL KNEE ARTHROPLASTY Left 12/14/2021   Procedure: LEFT TOTAL KNEE ARTHROPLASTY;  Surgeon: Newt Minion, MD;  Location: [place];  Service: Orthopedics;  Laterality: Left;   TUMOR REMOVAL     From Chest   Patient Active Problem List   Diagnosis Date Noted   S/P total knee arthroplasty, left 12/14/2021   Unilateral primary osteoarthritis, left knee 12/14/2021   Total knee replacement status, left 12/14/2021   Intentional drug overdose ([place]) 11/26/2021   Chronic fatigue 11/16/2021   Abnormal EKG 11/16/2021   Overweight (BMI 25.0-29.9) 11/16/2021   Transaminitis 07/03/2021   Right foot ulcer  ([place]) 07/03/2021   Onychomycosis 04/19/2021   Cocaine abuse ([place]) 03/16/2021   Bipolar I disorder, most recent episode depressed ([place]) 03/15/2021   Bipolar 1 disorder, depressed ([place]) 03/15/2021   Nodular radiologic density 02/02/2021   Lesion of liver 02/02/2021   Liver disease 02/02/2021   Vitamin D deficiency 01/18/2021   Alcohol abuse 01/17/2021   Methamphetamine abuse ([place]) 01/17/2021   Lesion of pancreas 01/17/2021   Substance induced mood disorder ([place]) 10/26/2020   Fatty liver 03/09/2020   Schizophrenia ([place]) 03/08/2020   Generalized anxiety disorder 02/03/2020   Insomnia due to other mental disorder 02/03/2020   Lisfranc dislocation, right, initial encounter    Prediabetes 07/22/2019   Tobacco abuse 07/10/2018   Schizoaffective disorder, bipolar type ([place])    Acute renal failure (ARF) ([place])    Suicidal ideations    Chronic 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 coma ([place])    AKI (acute kidney injury) ([place]) 02/19/2017   Rhabdomyolysis 02/19/2017   Suicide attempt by acetaminophen overdose ([place]) 02/19/2017   Acute hepatitis 02/19/2017   Elevated LFTs    Schizoaffective disorder, depressive type ([place]) 09/05/2011   Degenerative disc disease 08/21/2011   HIV disease ([place]) 08/17/2011    REFERRING DIAG: Status post total knee  replacement, left [Z96.652]    THERAPY DIAG:  Acute pain of left knee  Muscle weakness  Unsteadiness on feet  Localized edema  Rationale for Evaluation and Treatment Rehabilitation  PERTINENT HISTORY: Hepatitis B, HIV, bipolar, substance abuse (cocaine, methamphetamine, EtOH), bipolar, schizoaffective disorder, chronic fatigue, Lisfranc dislocation R with arthrodesis 09/17/19; suicidal ideations, DDD, and arthritis.    PRECAUTIONS: Fall  SUBJECTIVE:                                                                                                                                                                                       SUBJECTIVE STATEMENT: Patient reports that he doesn't have any pain in his knee right now, but that it feels a bit swollen from walking on campus today.    PAIN:  Are you having pain? Yes Pain location: Diffuse L knee pain  NPRS scale:  0/10 Aggravating factors: activity but can occur without activity as well Relieving factors: Ice Pain description: intermittent, constant, sharp, and aching Stage: Acute Stability: getting better 24 hour pattern: NA    OBJECTIVE: (objective measures completed at initial evaluation unless otherwise dated)   DIAGNOSTIC FINDINGS:  NA             GENERAL OBSERVATION/GAIT:                     Slow antalgic gait using quad cane, reduced step length R reduced time in stance L, reduced swing L    SENSATION:          Light touch: Appears intact   PALPATION: Staples in place, wound appears well healing, no streaking or redness, moderate edema, calf supple and non-tender   MUSCLE LENGTH: Hamstrings: Right subtle restriction; Left significant restriction ASLR: Right ASLR = PSLR; Left ASLR significantly reduced compared to PSLR      LE MMT:   MMT Right 12/21/2021 Left 12/21/2021  Hip flexion (L2, L3) 4 3-  Knee extension (L3) 4+ 3-*  Knee flexion 4+ 3*  Hip abduction      Hip extension      Hip external rotation      Hip internal rotation      Hip adduction      Ankle dorsiflexion (L4)      Ankle plantarflexion (S1)      Ankle inversion      Ankle eversion      Great Toe ext (L5)      Grossly        (Blank rows = not tested, score listed is out of 5 possible points.  N = WNL, D = diminished, C = clear for gross weakness with  myotome testing, * = concordant pain with testing)   LE ROM:   ROM Right 12/21/2021 Left 12/21/2021 Left 12/27/21 Left 01/10/22 Left 01/12/22  Hip flexion         Hip extension         Hip abduction         Hip adduction         Hip internal rotation         Hip external rotation         Knee flexion Full  >130 degrees 50 71 90 99  Knee extension 0 Lacking 12 Lacking 7 Lacking 6 Lacking 5  Ankle dorsiflexion         Ankle plantarflexion         Ankle inversion         Ankle eversion           (Blank rows = not tested, N = WNL, * = concordant pain with testing)   Functional Tests   Eval (12/21/2021)      Progressive balance screen (highest level completed for >/= 10''):   Feet together: 10'' Semi Tandem: R in rear 10'', L in rear 10'' Tandem: R in rear unable, L in rear 4''        30'' STS: 5x  UE used? Y                                                                                                PATIENT SURVEYS:  Koos Jr. - KOOS, JR. Score: 21 / 28, Interval Score: 34.174 / 100     TODAY'S TREATMENT: Creating, reviewing, and completing below HEP     PATIENT EDUCATION:  POC, diagnosis, prognosis, HEP, and outcome measures.  Pt educated via explanation, demonstration, and handout (HEP).  Pt confirms understanding verbally.    HOME EXERCISE PROGRAM: Access Code: [email or web address] URL: [email or web address] Date: 12/29/2021 Prepared by: Shearon Balo  Exercises - Long Sitting 4 Way Patellar Glide  - 4 x daily - 7 x weekly - 3 sets - 10 reps - Ice  - 5 x daily - 7 x weekly - 1 sets - 1 reps - 20 min hold - Seated Knee Flexion Stretch  - 2 x daily - 7 x weekly - 1 sets - 4 reps - 1' hold - Seated Hamstring Stretch  - 2 x daily - 7 x weekly - 1 sets - 3 reps - 45 hold - Supine Active Straight Leg Raise  - 2 x daily - 7 x weekly - 3 sets - 10 reps - Long Sitting Quad Set with Towel Roll Under Heel  - 5 x daily - 7 x weekly - 3 sets - 10 reps   ASTERISK SIGNS     Asterisk Signs Eval (12/21/2021) 12/21  01/10/22   01/12/22      Knee flex 50  76  90  99      L knee ext Lacking _0 30'' STS 5x w/ UE  Balance Tandem unable bil                            TREATMENT 01/12/22:  Therapeutic Exercise: - Nustep level 2 seat 13 for ROM x  5 mins - Quad set 5" hold 2x10 Lt (towel under knee) - knee flexion stretch on step - 2x20 - slant board stretch - x2' - heel raises - 3x10 - SLR 2x10 Lt (slight quad lag) - Heel slide with strap 5" hold at end range x10 - LLLD knee extension stretch heel on 1/2 foam roll, 5# above knee x2' Manual: - AP mob grade IV - Flexion/extension ROM at EOT  TREATMENT 01/10/22: Therapeutic Exercise: - Nustep level 2 seat 13 for ROM x 5 mins - Quad set 5" hold 2x10 Lt (towel under knee) - knee flexion stretch on step - 2x20 - slant board stretch - x2' - heel raises - 3x10 - SLR x10 Lt (slight quad lag) - SLR with strap - 5x5 - Heel slide with strap 5" hold at end range x10 - LLLD knee extension stretch heel on 1/2 foam roll, 4# above knee Manual: - AP mob grade IV - Flexion/extension ROM at EOT   TREATMENT 12/21: Therapeutic Exercise: - Nustep level 2 seat 13 for ROM x 5 mins - Quad set 5" hold 2x10 Lt (towel under knee) - knee ext into [place] - 4x10 - knee flexion stretch on step - 2x20 - slant board stretch - 32'' x2 - heel raises - 3x10 - SLR with strap - 5x5  Manual: - AP mob grade IV - Flexion/extension ROM at EOT - quad set w/ op   ASSESSMENT:   CLINICAL IMPRESSION: Patient presents to PT with no current pain in his knee and reports some soreness and swelling from increased walking on campus today. Session today continued to focus on Lt knee ROM and quadriceps activation and strengthening. His ROM continues to improve with 5-99 achieved today. Patient was able to tolerate all prescribed exercises with no adverse effects. Patient continues to benefit from skilled PT services and should be progressed as able to improve functional independence.     OBJECTIVE IMPAIRMENTS: Pain, L knee ROM, L knee and hip strength, gait, balance   ACTIVITY LIMITATIONS: bending, squatting, transfers, school, housework, ambulation   PERSONAL FACTORS: See medical history and pertinent history      REHAB POTENTIAL: Good   CLINICAL DECISION MAKING: Stable/uncomplicated   EVALUATION COMPLEXITY: Low     GOALS:     SHORT TERM GOALS: Target date: 01/18/2022   Kipper will be >75% HEP compliant to improve carryover between sessions and facilitate independent management of condition   Evaluation (12/21/2021): ongoing Goal status: MET Pt reports adherence 01/10/22     LONG TERM GOALS: Target date: 02/15/2022   Altonio will show a >/= 40 pt improvement in their KOOS Jr. score (MCID is 15 pts) as a proxy for functional improvement    Evaluation/Baseline (12/21/2021): KOOS, JR. Score: 21 / 28, Interval Score: 34.174 / 100 Goal status: Progressing       2.  Teo will achieve 110 degrees knee flexion to improve ability to complete transfers and navigate steps   Evaluation/Baseline (12/21/2021): 50 degrees Goal status: Progressing       3.  Jvon will achieve 3 degrees knee extension to improve mechanics of gait   Evaluation/Baseline (12/21/2021): lacking 12 degrees Goal status: Progressing       4.  Kjell will be able to stand for >30'' in SLS stance, to show a significant improvement in balance in order to reduce fall risk    Evaluation/Baseline (12/21/2021): tandem stance: unable Goal status: Progressing     5.  Trei will be able to return to [employer] school, not limited by pain   Evaluation/Baseline (12/21/2021): limited Goal status: Progressing       PLAN: PT FREQUENCY: 1-2x/week   PT DURATION: 8 weeks (Ending 02/15/2022)   PLANNED INTERVENTIONS: Therapeutic exercises, Aquatic therapy, Therapeutic activity, Neuro Muscular re-education, Gait training, Patient/Family education, Joint mobilization, Dry Needling, Electrical stimulation, Spinal mobilization and/or manipulation, Moist heat, Taping, Vasopneumatic device, Ionotophoresis 58m/ml Dexamethasone, and Manual therapy   PLAN FOR NEXT SESSION: TKA progression as tolerated   SMargarette Canada  PTA 01/12/2022, 4:10 PM

## 2022-01-17 ENCOUNTER — Other Ambulatory Visit: Payer: Self-pay | Admitting: Family

## 2022-01-17 MED ORDER — METHOCARBAMOL 500 MG PO TABS: 500.0000 mg | ORAL_TABLET | Freq: Four times a day (QID) | ORAL | 0 refills | Status: DC | PRN

## 2022-01-18 ENCOUNTER — Ambulatory Visit: Payer: Medicaid Other

## 2022-01-18 NOTE — Therapy (Incomplete)
OUTPATIENT PHYSICAL THERAPY TREATMENT NOTE   Patient Name: Dylan [name] MRN: [id number] DOB:1965-09-25, 57 y.o., male Today's Date: 01/18/2022  PCP: Dylan Rakes, MD  REFERRING PROVIDER: Newt Minion, MD   END OF SESSION:       Past Medical History:  Diagnosis Date   Anxiety    Arthritis    Bipolar 1 disorder (Dylan [name])    Colon polyps    Depression    GERD (gastroesophageal reflux disease)    Hepatitis B    Human immunodeficiency virus (HIV) (Dylan [name])    Hyperlipidemia    Hypertension    Neuromuscular disorder (Dylan [name])    neuropathy   Neuropathy    Pre-diabetes    Schizophrenia [facility])    Past Surgical History:  Procedure Laterality Date   FOOT ARTHRODESIS Right 09/17/2019   Procedure: FUSION RIGHT Dylan JOINT;  Surgeon: Dylan Minion, MD;  Location: [name];  Service: Orthopedics;  Laterality: Right;   FOOT ARTHRODESIS Right 09/2019   HEMORROIDECTOMY     IR FLUORO GUIDE CV LINE RIGHT  02/22/2017   IR US GUIDE VASC ACCESS RIGHT  02/22/2017   TOTAL KNEE ARTHROPLASTY Left 12/14/2021   Procedure: LEFT TOTAL KNEE ARTHROPLASTY;  Surgeon: Dylan Minion, MD;  Location: [name];  Service: Orthopedics;  Laterality: Left;   TUMOR REMOVAL     From Chest   Patient Active Problem List   Diagnosis Date Noted   S/P total knee arthroplasty, left 12/14/2021   Unilateral primary osteoarthritis, left knee 12/14/2021   Total knee replacement status, left 12/14/2021   Intentional drug overdose (Dylan [name]) 11/26/2021   Chronic fatigue 11/16/2021   Abnormal EKG 11/16/2021   Overweight (BMI 25.0-29.9) 11/16/2021   Transaminitis 07/03/2021   Right foot ulcer (Dylan [name]) 07/03/2021   Onychomycosis 04/19/2021   Cocaine abuse (Dylan [name]) 03/16/2021   Bipolar I disorder, most recent episode depressed (Dylan [name]) 03/15/2021   Bipolar 1 disorder, depressed (Dylan [name]) 03/15/2021   Nodular radiologic density 02/02/2021   Lesion of liver 02/02/2021   Liver disease 02/02/2021   Vitamin D deficiency 01/18/2021   Alcohol abuse  01/17/2021   Methamphetamine abuse (Dylan [name]) 01/17/2021   Lesion of pancreas 01/17/2021   Substance induced mood disorder (Dylan [name]) 10/26/2020   Fatty liver 03/09/2020   Schizophrenia (Dylan [name]) 03/08/2020   Generalized anxiety disorder 02/03/2020   Insomnia due to other mental disorder 02/03/2020   Dylan [name], right, initial encounter    Prediabetes 07/22/2019   Tobacco abuse 07/10/2018   Schizoaffective disorder, bipolar type (Dylan [name])    Acute renal failure (ARF) (Dylan [name])    Suicidal ideations    Chronic 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 coma (Dylan [name])    AKI (acute kidney injury) (Dylan [name]) 02/19/2017   Rhabdomyolysis 02/19/2017   Suicide attempt by acetaminophen overdose (Dylan [name]) 02/19/2017   Acute hepatitis 02/19/2017   Elevated LFTs    Schizoaffective disorder, depressive type (Dylan [name]) 09/05/2011   Degenerative disc disease 08/21/2011   HIV disease (Dylan [name]) 08/17/2011    REFERRING DIAG: Status post total knee replacement, left [Z96.652]    THERAPY DIAG:  No diagnosis found.  Rationale for Evaluation and Treatment Rehabilitation  PERTINENT HISTORY: Hepatitis B, HIV, bipolar, substance abuse (cocaine, methamphetamine, EtOH), bipolar, schizoaffective disorder, chronic fatigue, Dylan [name] R with arthrodesis 09/17/19; suicidal ideations, DDD, and arthritis.    PRECAUTIONS: Fall  SUBJECTIVE:  SUBJECTIVE STATEMENT: ***Patient reports that he doesn't have any pain in his knee right now, but that it feels a bit swollen from walking on campus today.    PAIN:  Are you having pain? Yes Pain location: Diffuse L knee pain  NPRS scale:  ***0/10 Aggravating factors: activity but can occur without activity as well Relieving factors: Ice Pain description: intermittent, constant, sharp, and aching Stage:  Acute Stability: getting better 24 hour pattern: NA    OBJECTIVE: (objective measures completed at initial evaluation unless otherwise dated)   DIAGNOSTIC FINDINGS:  NA             GENERAL OBSERVATION/GAIT:                     Slow antalgic gait using quad cane, reduced step length R reduced time in stance L, reduced swing L    SENSATION:          Light touch: Appears intact   PALPATION: Staples in place, wound appears well healing, no streaking or redness, moderate edema, calf supple and non-tender   MUSCLE LENGTH: Hamstrings: Right subtle restriction; Left significant restriction ASLR: Right ASLR = PSLR; Left ASLR significantly reduced compared to PSLR      LE MMT:   MMT Right 12/21/2021 Left 12/21/2021  Hip flexion (L2, L3) 4 3-  Knee extension (L3) 4+ 3-*  Knee flexion 4+ 3*  Hip abduction      Hip extension      Hip external rotation      Hip internal rotation      Hip adduction      Ankle dorsiflexion (L4)      Ankle plantarflexion (S1)      Ankle inversion      Ankle eversion      Great Toe ext (L5)      Grossly        (Blank rows = not tested, score listed is out of 5 possible points.  N = WNL, D = diminished, C = clear for gross weakness with myotome testing, * = concordant pain with testing)   LE ROM:   ROM Right 12/21/2021 Left 12/21/2021 Left 12/27/21 Left 01/10/22 Left 01/12/22 Left 01/18/22  Hip flexion          Hip extension          Hip abduction          Hip adduction          Hip internal rotation          Hip external rotation          Knee flexion Full >130 degrees 50 71 90 99 ***  Knee extension 0 Lacking 12 Lacking 7 Lacking 6 Lacking 5   Ankle dorsiflexion          Ankle plantarflexion          Ankle inversion          Ankle eversion            (Blank rows = not tested, N = WNL, * = concordant pain with testing)   Functional Tests   Eval (12/21/2021)      Progressive balance screen (highest level completed for >/= 10''):    Feet together: 10'' Semi Tandem: R in rear 10'', L in rear 10'' Tandem: R in rear unable, L in rear 4''        30'' STS: 5x  UE used? [name]  PATIENT SURVEYS:  Koos Jr. - KOOS, JR. Score: 21 / 28, Interval Score: 34.174 / 100     TODAY'S TREATMENT: Creating, reviewing, and completing below HEP     PATIENT EDUCATION:  POC, diagnosis, prognosis, HEP, and outcome measures.  Pt educated via explanation, demonstration, and handout (HEP).  Pt confirms understanding verbally.    HOME EXERCISE PROGRAM: Access Code: [email or web address] URL: [email or web address] Date: 12/29/2021 Prepared by: Shearon Balo  Exercises - Long Sitting 4 Way Patellar Glide  - 4 x daily - 7 x weekly - 3 sets - 10 reps - Ice  - 5 x daily - 7 x weekly - 1 sets - 1 reps - 20 min hold - Seated Knee Flexion Stretch  - 2 x daily - 7 x weekly - 1 sets - 4 reps - 1' hold - Seated Hamstring Stretch  - 2 x daily - 7 x weekly - 1 sets - 3 reps - 45 hold - Supine Active Straight Leg Raise  - 2 x daily - 7 x weekly - 3 sets - 10 reps - Long Sitting Quad Set with Towel Roll Under Heel  - 5 x daily - 7 x weekly - 3 sets - 10 reps   ASTERISK SIGNS     Asterisk Signs Eval (12/21/2021) 12/21  01/10/22   01/12/22 01/18/22     Knee flex 50  76  90  99  ***    L knee ext Lacking 12  7 6   5       30'' STS 5x w/ UE            Balance Tandem unable bil                            TREATMENT 01/18/22: Therapeutic Exercise: - Nustep level 2 seat 13 for ROM x 5 mins - Quad set 5" hold 2x10 Lt (towel under knee) - knee flexion stretch on step - 2x20 - Step ups 4" fwd/lat L leading 2x10 each - slant board stretch - x2' - heel raises - 3x10 - SLR 2x10 Lt (slight quad lag) - Heel slide with strap 5" hold at end range x10 - LLLD knee extension stretch heel on 1/2 foam roll, 5# above knee x2' Manual: - AP mob grade IV -  Flexion/extension ROM at EOT  TREATMENT 01/12/22:  Therapeutic Exercise: - Nustep level 2 seat 13 for ROM x 5 mins - Quad set 5" hold 2x10 Lt (towel under knee) - knee flexion stretch on step - 2x20 - slant board stretch - x2' - heel raises - 3x10 - SLR 2x10 Lt (slight quad lag) - Heel slide with strap 5" hold at end range x10 - LLLD knee extension stretch heel on 1/2 foam roll, 5# above knee x2' Manual: - AP mob grade IV - Flexion/extension ROM at EOT  TREATMENT 01/10/22: Therapeutic Exercise: - Nustep level 2 seat 13 for ROM x 5 mins - Quad set 5" hold 2x10 Lt (towel under knee) - knee flexion stretch on step - 2x20 - slant board stretch - x2' - heel raises - 3x10 - SLR x10 Lt (slight quad lag) - SLR with strap - 5x5 - Heel slide with strap 5" hold at end range x10 - LLLD knee extension stretch heel on 1/2 foam roll, 4# above knee Manual: - AP mob grade IV - Flexion/extension ROM at EOT   ASSESSMENT:   CLINICAL IMPRESSION: ***  Patient presents  to PT with no current pain in his knee and reports some soreness and swelling from increased walking on campus today. Session today continued to focus on Lt knee ROM and quadriceps activation and strengthening. His ROM continues to improve with 5-99 achieved today. Patient was able to tolerate all prescribed exercises with no adverse effects. Patient continues to benefit from skilled PT services and should be progressed as able to improve functional independence.     OBJECTIVE IMPAIRMENTS: Pain, L knee ROM, L knee and hip strength, gait, balance   ACTIVITY LIMITATIONS: bending, squatting, transfers, school, housework, ambulation   PERSONAL FACTORS: See medical history and pertinent history     REHAB POTENTIAL: Good   CLINICAL DECISION MAKING: Stable/uncomplicated   EVALUATION COMPLEXITY: Low     GOALS:     SHORT TERM GOALS: Target date: 01/18/2022   Eva will be >75% HEP compliant to improve carryover between sessions  and facilitate independent management of condition   Evaluation (12/21/2021): ongoing Goal status: MET Pt reports adherence 01/10/22     LONG TERM GOALS: Target date: 02/15/2022   Auston will show a >/= 40 pt improvement in their KOOS Jr. score (MCID is 15 pts) as a proxy for functional improvement    Evaluation/Baseline (12/21/2021): KOOS, JR. Score: 21 / 28, Interval Score: 34.174 / 100 Goal status: Progressing       2.  Christorpher will achieve 110 degrees knee flexion to improve ability to complete transfers and navigate steps   Evaluation/Baseline (12/21/2021): 50 degrees Goal status: Progressing       3.  Olin will achieve 3 degrees knee extension to improve mechanics of gait   Evaluation/Baseline (12/21/2021): lacking 12 degrees Goal status: Progressing       4.  Tarig will be able to stand for >30'' in SLS stance, to show a significant improvement in balance in order to reduce fall risk    Evaluation/Baseline (12/21/2021): tandem stance: unable Goal status: Progressing     5.  Zayveon will be able to return to [employer] school, not limited by pain   Evaluation/Baseline (12/21/2021): limited Goal status: Progressing       PLAN: PT FREQUENCY: 1-2x/week   PT DURATION: 8 weeks (Ending 02/15/2022)   PLANNED INTERVENTIONS: Therapeutic exercises, Aquatic therapy, Therapeutic activity, Neuro Muscular re-education, Gait training, Patient/Family education, Joint mobilization, Dry Needling, Electrical stimulation, Spinal mobilization and/or manipulation, Moist heat, Taping, Vasopneumatic device, Ionotophoresis 4mg /ml Dexamethasone, and Manual therapy   PLAN FOR NEXT SESSION: TKA progression as tolerated   Margarette Canada, PTA 01/18/2022, 12:44 PM

## 2022-01-20 ENCOUNTER — Ambulatory Visit: Payer: Medicaid Other | Admitting: Physical Therapy

## 2022-01-20 ENCOUNTER — Ambulatory Visit (INDEPENDENT_AMBULATORY_CARE_PROVIDER_SITE_OTHER): Payer: Medicaid Other | Admitting: Podiatry

## 2022-01-20 ENCOUNTER — Encounter: Payer: Self-pay | Admitting: Physical Therapy

## 2022-01-20 DIAGNOSIS — R600 Localized edema: Secondary | ICD-10-CM

## 2022-01-20 DIAGNOSIS — M25562 Pain in left knee: Secondary | ICD-10-CM

## 2022-01-20 DIAGNOSIS — M79675 Pain in left toe(s): Secondary | ICD-10-CM

## 2022-01-20 DIAGNOSIS — B351 Tinea unguium: Secondary | ICD-10-CM

## 2022-01-20 DIAGNOSIS — R2681 Unsteadiness on feet: Secondary | ICD-10-CM

## 2022-01-20 DIAGNOSIS — M79674 Pain in right toe(s): Secondary | ICD-10-CM

## 2022-01-20 DIAGNOSIS — M6281 Muscle weakness (generalized): Secondary | ICD-10-CM

## 2022-01-20 MED ORDER — CICLOPIROX 8 % EX SOLN: Freq: Every day | CUTANEOUS | 2 refills | Status: DC

## 2022-01-20 NOTE — Patient Instructions (Signed)
For topical creams you can also use capsaicin cream or Aspercreme with lidocaine.  Ciclopirox Topical Solution What is this medication? CICLOPIROX (sye kloe PEER ox) treats fungal infections of the nails. It belongs to a group of medications called antifungals. It will not treat infections caused by bacteria or viruses. This medicine may be used for other purposes; ask your health care provider or pharmacist if you have questions. COMMON BRAND NAME(S): Ciclodan Nail Solution, CNL8, Penlac What should I tell my care team before I take this medication? They need to know if you have any of these conditions: Diabetes (high blood sugar) Immune system problems Organ transplant Receiving steroid inhalers, cream, or lotion Seizures Tingling of the fingers or toes or other nerve disorder An unusual or allergic reaction to ciclopirox, other medications, foods, dyes, or preservatives Pregnant or trying to get pregnant Breast-feeding How should I use this medication? This medication is for external use only. Do not take by mouth. Wash your hands before and after use. If you are treating your hands, only wash your hands before use. Do not get it in your eyes. If you do, rinse your eyes with plenty of cool tap water. Use it as directed on the prescription label at the same time every day. Do not use it more often than directed. Use the medication for the full course as directed by your care team, even if you think you are better. Do not stop using it unless your care team tells you to stop it early. Apply a thin film of the medication to the affected area. Talk to your care team about the use of this medication in children. While it may be prescribed for children as young as 12 years for selected conditions, precautions do apply. Overdosage: If you think you have taken too much of this medicine contact a poison control center or emergency room at once. NOTE: This medicine is only for you. Do not share this  medicine with others. What if I miss a dose? If you miss a dose, use it as soon as you can. If it is almost time for your next dose, use only that dose. Do not use double or extra doses. What may interact with this medication? Interactions are not expected. Do not use any other skin products without telling your care team. This list may not describe all possible interactions. Give your health care provider a list of all the medicines, herbs, non-prescription drugs, or dietary supplements you use. Also tell them if you smoke, drink alcohol, or use illegal drugs. Some items may interact with your medicine. What should I watch for while using this medication? Visit your care team for regular checks on your progress. It may be some time before you see the benefit from this medication. Do not use nail polish or other nail cosmetic products on the treated nails. Removal of the unattached, infected nail by your care team is needed with use of this medication. If you have diabetes or numbness in your fingers or toes, talk to your care team about proper nail care. What side effects may I notice from receiving this medication? Side effects that you should report to your care team as soon as possible: Allergic reactions--skin rash, itching, hives, swelling of the face, lips, tongue, or throat Burning, itching, crusting, or peeling of treated skin Side effects that usually do not require medical attention (report to your care team if they continue or are bothersome): Change in nail shape, thickness, or color  Mild skin irritation, redness, or dryness This list may not describe all possible side effects. Call your doctor for medical advice about side effects. You may report side effects to FDA at 1-800-FDA-1088. Where should I keep my medication? Keep out of the reach of children and pets. Store at room temperature between 20 and 25 degrees C (68 and 77 degrees F). This medication is flammable. Avoid exposure  to heat, fire, flame, and smoking. Get rid of medications that are no longer needed or have expired: Take the medication to a medication take-back program. Check with your pharmacy or law enforcement to find a location. If you cannot return the medication, check the label or package insert to see if the medication should be thrown out in the garbage or flushed down the toilet. If you are not sure, ask your care team. If it is safe to put in the trash, take the medication out of the container. Mix the medication with cat litter, dirt, coffee grounds, or other unwanted substance. Seal the mixture in a bag or container. Put it in the trash. NOTE: This sheet is a summary. It may not cover all possible information. If you have questions about this medicine, talk to your doctor, pharmacist, or health care provider.  2023 Elsevier/Gold Standard (2020-12-07 00:00:00)

## 2022-01-20 NOTE — Therapy (Signed)
OUTPATIENT PHYSICAL THERAPY TREATMENT NOTE   Patient Name: Dylan [name] MRN: [id number] DOB:08/12/1965, 57 y.o., male Today's Date: 01/20/2022  PCP: Charlott Rakes, MD  REFERRING PROVIDER: Newt Minion, MD   END OF SESSION:   PT End of Session - 01/20/22 1043     Visit Number 6    Date for PT Re-Evaluation 02/15/22    Authorization Type [place] [facility] - KOOS Jr.    PT Start Time 5170    PT Stop Time 0174    PT Time Calculation (min) 41 min    Activity Tolerance Patient tolerated treatment well    Behavior During Therapy WFL for tasks assessed/performed               Past Medical History:  Diagnosis Date   Anxiety    Arthritis    Bipolar 1 disorder ([place])    Colon polyps    Depression    GERD (gastroesophageal reflux disease)    Hepatitis B    Human immunodeficiency virus (HIV) ([place])    Hyperlipidemia    Hypertension    Neuromuscular disorder ([place])    neuropathy   Neuropathy    Pre-diabetes    Schizophrenia ([place])    Past Surgical History:  Procedure Laterality Date   FOOT ARTHRODESIS Right 09/17/2019   Procedure: FUSION RIGHT LISFRANC JOINT;  Surgeon: Newt Minion, MD;  Location: [facility];  Service: Orthopedics;  Laterality: Right;   FOOT ARTHRODESIS Right 09/2019   HEMORROIDECTOMY     IR FLUORO GUIDE CV LINE RIGHT  02/22/2017   IR US GUIDE VASC ACCESS RIGHT  02/22/2017   TOTAL KNEE ARTHROPLASTY Left 12/14/2021   Procedure: LEFT TOTAL KNEE ARTHROPLASTY;  Surgeon: Newt Minion, MD;  Location: [facility];  Service: Orthopedics;  Laterality: Left;   TUMOR REMOVAL     From Chest   Patient Active Problem List   Diagnosis Date Noted   S/P total knee arthroplasty, left 12/14/2021   Unilateral primary osteoarthritis, left knee 12/14/2021   Total knee replacement status, left 12/14/2021   Intentional drug overdose ([place]) 11/26/2021   Chronic fatigue 11/16/2021   Abnormal EKG 11/16/2021   Overweight (BMI 25.0-29.9) 11/16/2021   Transaminitis 07/03/2021   Right foot ulcer  ([place]) 07/03/2021   Onychomycosis 04/19/2021   Cocaine abuse ([place]) 03/16/2021   Bipolar I disorder, most recent episode depressed ([place]) 03/15/2021   Bipolar 1 disorder, depressed ([place]) 03/15/2021   Nodular radiologic density 02/02/2021   Lesion of liver 02/02/2021   Liver disease 02/02/2021   Vitamin D deficiency 01/18/2021   Alcohol abuse 01/17/2021   Methamphetamine abuse ([place]) 01/17/2021   Lesion of pancreas 01/17/2021   Substance induced mood disorder ([place]) 10/26/2020   Fatty liver 03/09/2020   Schizophrenia ([place]) 03/08/2020   Generalized anxiety disorder 02/03/2020   Insomnia due to other mental disorder 02/03/2020   Lisfranc dislocation, right, initial encounter    Prediabetes 07/22/2019   Tobacco abuse 07/10/2018   Schizoaffective disorder, bipolar type ([place])    Acute renal failure (ARF) ([place])    Suicidal ideations    Chronic 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 coma ([place])    AKI (acute kidney injury) ([place]) 02/19/2017   Rhabdomyolysis 02/19/2017   Suicide attempt by acetaminophen overdose ([place]) 02/19/2017   Acute hepatitis 02/19/2017   Elevated LFTs    Schizoaffective disorder, depressive type ([place]) 09/05/2011   Degenerative disc disease 08/21/2011   HIV disease ([place]) 08/17/2011    REFERRING DIAG: Status post total knee  replacement, left [Z96.652]  (12/6)  THERAPY DIAG:  Acute pain of left knee  Muscle weakness  Unsteadiness on feet  Localized edema  Rationale for Evaluation and Treatment Rehabilitation  PERTINENT HISTORY: Hepatitis B, HIV, bipolar, substance abuse (cocaine, methamphetamine, EtOH), bipolar, schizoaffective disorder, chronic fatigue, Lisfranc dislocation R with arthrodesis 09/17/19; suicidal ideations, DDD, and arthritis.    PRECAUTIONS: Fall  SUBJECTIVE:                                                                                                                                                                                       SUBJECTIVE STATEMENT: Pt reports that he hasn't been able to stretch as much over th last several days because he started back to school and has been very busy.   PAIN:  Are you having pain? Yes Pain location: Diffuse L knee pain  NPRS scale:  0/10 Aggravating factors: activity but can occur without activity as well Relieving factors: Ice Pain description: intermittent, constant, sharp, and aching Stage: Acute Stability: getting better 24 hour pattern: NA    OBJECTIVE: (objective measures completed at initial evaluation unless otherwise dated)   DIAGNOSTIC FINDINGS:  NA             GENERAL OBSERVATION/GAIT:                     Slow antalgic gait using quad cane, reduced step length R reduced time in stance L, reduced swing L    SENSATION:          Light touch: Appears intact   PALPATION: Staples in place, wound appears well healing, no streaking or redness, moderate edema, calf supple and non-tender   MUSCLE LENGTH: Hamstrings: Right subtle restriction; Left significant restriction ASLR: Right ASLR = PSLR; Left ASLR significantly reduced compared to PSLR      LE MMT:   MMT Right 12/21/2021 Left 12/21/2021  Hip flexion (L2, L3) 4 3-  Knee extension (L3) 4+ 3-*  Knee flexion 4+ 3*  Hip abduction      Hip extension      Hip external rotation      Hip internal rotation      Hip adduction      Ankle dorsiflexion (L4)      Ankle plantarflexion (S1)      Ankle inversion      Ankle eversion      Great Toe ext (L5)      Grossly        (Blank rows = not tested, score listed is out of 5 possible points.  N = WNL, D = diminished, C = clear for gross weakness  with myotome testing, * = concordant pain with testing)   LE ROM:   ROM Right 12/21/2021 Left 12/21/2021 Left 12/27/21 Left 01/10/22 Left 01/12/22  Hip flexion         Hip extension         Hip abduction         Hip adduction         Hip internal rotation         Hip external rotation         Knee  flexion Full >130 degrees 50 71 90 99  Knee extension 0 Lacking 12 Lacking 7 Lacking 6 Lacking 5  Ankle dorsiflexion         Ankle plantarflexion         Ankle inversion         Ankle eversion           (Blank rows = not tested, N = WNL, * = concordant pain with testing)   Functional Tests   Eval (12/21/2021)      Progressive balance screen (highest level completed for >/= 10''):   Feet together: 10'' Semi Tandem: R in rear 10'', L in rear 10'' Tandem: R in rear unable, L in rear 4''        30'' STS: 5x  UE used? Y                                                                                                PATIENT SURVEYS:  Koos Jr. - KOOS, JR. Score: 21 / 28, Interval Score: 34.174 / 100     TODAY'S TREATMENT: Creating, reviewing, and completing below HEP     PATIENT EDUCATION:  POC, diagnosis, prognosis, HEP, and outcome measures.  Pt educated via explanation, demonstration, and handout (HEP).  Pt confirms understanding verbally.    HOME EXERCISE PROGRAM: Access Code: [email or web address] URL: [email or web address] Date: 12/29/2021 Prepared by: Shearon Balo  Exercises - Long Sitting 4 Way Patellar Glide  - 4 x daily - 7 x weekly - 3 sets - 10 reps - Ice  - 5 x daily - 7 x weekly - 1 sets - 1 reps - 20 min hold - Seated Knee Flexion Stretch  - 2 x daily - 7 x weekly - 1 sets - 4 reps - 1' hold - Seated Hamstring Stretch  - 2 x daily - 7 x weekly - 1 sets - 3 reps - 45 hold - Supine Active Straight Leg Raise  - 2 x daily - 7 x weekly - 3 sets - 10 reps - Long Sitting Quad Set with Towel Roll Under Heel  - 5 x daily - 7 x weekly - 3 sets - 10 reps   ASTERISK SIGNS     Asterisk Signs Eval (12/21/2021) 12/21  01/10/22   01/12/22      Knee flex 50  76  90  99      L knee ext Lacking 12  7 6   5       30'' STS 5x w/ UE  Balance Tandem unable bil                            TREATMENT 01/12/22:  Therapeutic Exercise: - bike for ROM x 5  mins - knee flexion stretch on step - 2x20 - slant board stretch - x2' - heel raises on step - 3x15 - knee ext machine x15 bil, 2x10 2 up 1 down - HS curl - 35# - 3x10  Manual: - extension with OP  Neuromuscular re-ed: Tandem on foam - 45'' bouts DF/PF wooden rocker board  TREATMENT 01/10/22: Therapeutic Exercise: - Nustep level 2 seat 13 for ROM x 5 mins - Quad set 5" hold 2x10 Lt (towel under knee) - knee flexion stretch on step - 2x20 - slant board stretch - x2' - heel raises - 3x10 - SLR x10 Lt (slight quad lag) - SLR with strap - 5x5 - Heel slide with strap 5" hold at end range x10 - LLLD knee extension stretch heel on 1/2 foam roll, 4# above knee Manual: - AP mob grade IV - Flexion/extension ROM at EOT   TREATMENT 12/21: Therapeutic Exercise: - Nustep level 2 seat 13 for ROM x 5 mins - Quad set 5" hold 2x10 Lt (towel under knee) - knee ext into [place] - 4x10 - knee flexion stretch on step - 2x20 - slant board stretch - 76'' x2 - heel raises - 3x10 - SLR with strap - 5x5  Manual: - Flexion/extension ROM at EOT - quad set w/ op   ASSESSMENT:   CLINICAL IMPRESSION: Camara tolerated session well with no adverse reaction.  No significant change in ROM this visit.  Emphasized importance of stretching regularly at home; he confirms understanding.  Progressed to more aggressive quad strengthening with knee ext machine today to good effect.  Integrate balance and gait.    OBJECTIVE IMPAIRMENTS: Pain, L knee ROM, L knee and hip strength, gait, balance   ACTIVITY LIMITATIONS: bending, squatting, transfers, school, housework, ambulation   PERSONAL FACTORS: See medical history and pertinent history     REHAB POTENTIAL: Good   CLINICAL DECISION MAKING: Stable/uncomplicated   EVALUATION COMPLEXITY: Low     GOALS:     SHORT TERM GOALS: Target date: 01/18/2022   Auston will be >75% HEP compliant to improve carryover between sessions and facilitate independent  management of condition   Evaluation (12/21/2021): ongoing Goal status: MET Pt reports adherence 01/10/22     LONG TERM GOALS: Target date: 02/15/2022   Kong will show a >/= 40 pt improvement in their KOOS Jr. score (MCID is 15 pts) as a proxy for functional improvement    Evaluation/Baseline (12/21/2021): KOOS, JR. Score: 21 / 28, Interval Score: 34.174 / 100 Goal status: Progressing       2.  Prynce will achieve 110 degrees knee flexion to improve ability to complete transfers and navigate steps   Evaluation/Baseline (12/21/2021): 50 degrees Goal status: Progressing       3.  Caryl will achieve 3 degrees knee extension to improve mechanics of gait   Evaluation/Baseline (12/21/2021): lacking 12 degrees Goal status: Progressing       4.  Kevork will be able to stand for >30'' in SLS stance, to show a significant improvement in balance in order to reduce fall risk    Evaluation/Baseline (12/21/2021): tandem stance: unable Goal status: Progressing     5.  Rayder will be able to return to [employer], not limited by  pain   Evaluation/Baseline (12/21/2021): limited Goal status: Progressing       PLAN: PT FREQUENCY: 1-2x/week   PT DURATION: 8 weeks (Ending 02/15/2022)   PLANNED INTERVENTIONS: Therapeutic exercises, Aquatic therapy, Therapeutic activity, Neuro Muscular re-education, Gait training, Patient/Family education, Joint mobilization, Dry Needling, Electrical stimulation, Spinal mobilization and/or manipulation, Moist heat, Taping, Vasopneumatic device, Ionotophoresis 4mg /ml Dexamethasone, and Manual therapy   PLAN FOR NEXT SESSION: TKA progression as tolerated   Mathis Dad, PT 01/20/2022, 11:34 AM

## 2022-01-20 NOTE — Progress Notes (Signed)
Subjective: Chief Complaint  Patient presents with   Nail Problem    Rm 13 RFC bilateral nail trim 35-32.    57 year old male presents the office today for the above concerns.  He has not been using the penlac as he forgot to use it.  No swelling redness or drainage.  States that his neuropathy seems to be getting worse.  Gabapentin dose was recently increased last month.  Objective: AAO x3, NAD DP/PT pulses palpable bilaterally, CRT less than 3 seconds Nails are hypertrophic, dystrophic, brittle, discolored, elongated 10. No surrounding redness or drainage. Tenderness nails 1-5 bilaterally. No open lesions or pre-ulcerative lesions are identified today. No pain with calf compression, swelling, warmth, erythema  Assessment: Symptomatic onychomycosis, neuropathy  Plan: -All treatment options discussed with the patient including all alternatives, risks, complications.  -Sharply debrided nails x 10 without any complications or bleeding -Continue Penlac - refilled today -Discussed topical medication for neuropathy as well and combination with the gabapentin. -Patient encouraged to call the office with any questions, concerns, change in symptoms.   Trula Slade DPM

## 2022-01-24 ENCOUNTER — Encounter: Payer: Self-pay | Admitting: Physical Therapy

## 2022-01-24 ENCOUNTER — Ambulatory Visit: Payer: Medicaid Other | Admitting: Physical Therapy

## 2022-01-24 DIAGNOSIS — M25562 Pain in left knee: Secondary | ICD-10-CM

## 2022-01-24 DIAGNOSIS — R2681 Unsteadiness on feet: Secondary | ICD-10-CM

## 2022-01-24 DIAGNOSIS — M6281 Muscle weakness (generalized): Secondary | ICD-10-CM

## 2022-01-24 DIAGNOSIS — R600 Localized edema: Secondary | ICD-10-CM

## 2022-01-24 NOTE — Therapy (Signed)
OUTPATIENT PHYSICAL THERAPY TREATMENT NOTE   Patient Name: Dylan [name] MRN: [id number] DOB:[birth year], 57 y.o., male Today's Date: 01/24/2022  PCP: Charlott Rakes, MD  REFERRING PROVIDER: Newt Minion, MD   END OF SESSION:   PT End of Session - 01/24/22 0910     Visit Number 7    Date for PT Re-Evaluation 02/15/22    Authorization Type [place] MCD - KOOS Jr.    Authorization Time Period submitted for more auth 1/16    PT Start Time 0915    PT Stop Time 0956    PT Time Calculation (min) 41 min    Activity Tolerance Patient tolerated treatment well    Behavior During Therapy [facility] for tasks assessed/performed               Past Medical History:  Diagnosis Date   Anxiety    Arthritis    Bipolar 1 disorder ([place])    Colon polyps    Depression    GERD (gastroesophageal reflux disease)    Hepatitis B    Human immunodeficiency virus (HIV) ([place])    Hyperlipidemia    Hypertension    Neuromuscular disorder ([place])    neuropathy   Neuropathy    Pre-diabetes    Schizophrenia ([place])    Past Surgical History:  Procedure Laterality Date   FOOT ARTHRODESIS Right 09/17/2019   Procedure: FUSION RIGHT LISFRANC JOINT;  Surgeon: Newt Minion, MD;  Location: [place];  Service: Orthopedics;  Laterality: Right;   FOOT ARTHRODESIS Right 09/2019   HEMORROIDECTOMY     IR FLUORO GUIDE CV LINE RIGHT  02/22/2017   IR US GUIDE VASC ACCESS RIGHT  02/22/2017   TOTAL KNEE ARTHROPLASTY Left 12/14/2021   Procedure: LEFT TOTAL KNEE ARTHROPLASTY;  Surgeon: Newt Minion, MD;  Location: [place];  Service: Orthopedics;  Laterality: Left;   TUMOR REMOVAL     From Chest   Patient Active Problem List   Diagnosis Date Noted   S/P total knee arthroplasty, left 12/14/2021   Unilateral primary osteoarthritis, left knee 12/14/2021   Total knee replacement status, left 12/14/2021   Intentional drug overdose ([place]) 11/26/2021   Chronic fatigue 11/16/2021   Abnormal EKG 11/16/2021   Overweight (BMI 25.0-29.9)  11/16/2021   Transaminitis 07/03/2021   Right foot ulcer ([place]) 07/03/2021   Onychomycosis 04/19/2021   Cocaine abuse ([place]) 03/16/2021   Bipolar I disorder, most recent episode depressed ([place]) 03/15/2021   Bipolar 1 disorder, depressed ([place]) 03/15/2021   Nodular radiologic density 02/02/2021   Lesion of liver 02/02/2021   Liver disease 02/02/2021   Vitamin D deficiency 01/18/2021   Alcohol abuse 01/17/2021   Methamphetamine abuse ([place]) 01/17/2021   Lesion of pancreas 01/17/2021   Substance induced mood disorder ([place]) 10/26/2020   Fatty liver 03/09/2020   Schizophrenia ([place]) 03/08/2020   Generalized anxiety disorder 02/03/2020   Insomnia due to other mental disorder 02/03/2020   Lisfranc dislocation, right, initial encounter    Prediabetes 07/22/2019   Tobacco abuse 07/10/2018   Schizoaffective disorder, bipolar type ([place])    Acute renal failure (ARF) ([place])    Suicidal ideations    Chronic 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 coma ([place])    AKI (acute kidney injury) ([place]) 02/19/2017   Rhabdomyolysis 02/19/2017   Suicide attempt by acetaminophen overdose ([place]) 02/19/2017   Acute hepatitis 02/19/2017   Elevated LFTs    Schizoaffective disorder, depressive type ([place]) 09/05/2011   Degenerative disc disease 08/21/2011   HIV disease (  [place]) 08/17/2011    REFERRING DIAG: Status post total knee replacement, left [Z96.652]  (12/6)  THERAPY DIAG:  Acute pain of left knee  Muscle weakness  Unsteadiness on feet  Localized edema  Rationale for Evaluation and Treatment Rehabilitation  PERTINENT HISTORY: Hepatitis B, HIV, bipolar, substance abuse (cocaine, methamphetamine, EtOH), bipolar, schizoaffective disorder, chronic fatigue, Lisfranc dislocation R with arthrodesis 09/17/19; suicidal ideations, DDD, and arthritis.    PRECAUTIONS: Fall  SUBJECTIVE:                                                                                                                                                                                       SUBJECTIVE STATEMENT: Pt reports that his knee is doing well overall.  He has been HEP compliant   PAIN:  Are you having pain? Yes Pain location: Diffuse L knee pain  NPRS scale:  0/10 Aggravating factors: activity but can occur without activity as well Relieving factors: Ice Pain description: intermittent, constant, sharp, and aching Stage: Acute Stability: getting better 24 hour pattern: NA    OBJECTIVE: (objective measures completed at initial evaluation unless otherwise dated)   DIAGNOSTIC FINDINGS:  NA             GENERAL OBSERVATION/GAIT:                     Slow antalgic gait using quad cane, reduced step length R reduced time in stance L, reduced swing L    SENSATION:          Light touch: Appears intact   PALPATION: Staples in place, wound appears well healing, no streaking or redness, moderate edema, calf supple and non-tender   MUSCLE LENGTH: Hamstrings: Right subtle restriction; Left significant restriction ASLR: Right ASLR = PSLR; Left ASLR significantly reduced compared to PSLR      LE MMT:   MMT Right 12/21/2021 Left 12/21/2021  Hip flexion (L2, L3) 4 3-  Knee extension (L3) 4+ 3-*  Knee flexion 4+ 3*  Hip abduction      Hip extension      Hip external rotation      Hip internal rotation      Hip adduction      Ankle dorsiflexion (L4)      Ankle plantarflexion (S1)      Ankle inversion      Ankle eversion      Great Toe ext (L5)      Grossly        (Blank rows = not tested, score listed is out of 5 possible points.  N = WNL, D = diminished, C = clear for gross weakness with  myotome testing, * = concordant pain with testing)   LE ROM:   ROM Right 12/21/2021 Left 12/21/2021 Left 12/27/21 Left 01/10/22 Left 01/12/22  Hip flexion         Hip extension         Hip abduction         Hip adduction         Hip internal rotation         Hip external rotation         Knee flexion Full  >130 degrees 50 71 90 99  Knee extension 0 Lacking 12 Lacking 7 Lacking 6 Lacking 5  Ankle dorsiflexion         Ankle plantarflexion         Ankle inversion         Ankle eversion           (Blank rows = not tested, N = WNL, * = concordant pain with testing)   Functional Tests   Eval (12/21/2021)      Progressive balance screen (highest level completed for >/= 10''):   Feet together: 10'' Semi Tandem: R in rear 10'', L in rear 10'' Tandem: R in rear unable, L in rear 4''        30'' STS: 5x  UE used? Y                                                                                                PATIENT SURVEYS:  Koos Jr. - KOOS, JR. Score: 21 / 28, Interval Score: 34.174 / 100     TODAY'S TREATMENT: Creating, reviewing, and completing below HEP     PATIENT EDUCATION:  POC, diagnosis, prognosis, HEP, and outcome measures.  Pt educated via explanation, demonstration, and handout (HEP).  Pt confirms understanding verbally.    HOME EXERCISE PROGRAM: Access Code: [email or web address] URL: [email or web address] Date: 12/29/2021 Prepared by: Shearon Balo  Exercises - Long Sitting 4 Way Patellar Glide  - 4 x daily - 7 x weekly - 3 sets - 10 reps - Ice  - 5 x daily - 7 x weekly - 1 sets - 1 reps - 20 min hold - Seated Knee Flexion Stretch  - 2 x daily - 7 x weekly - 1 sets - 4 reps - 1' hold - Seated Hamstring Stretch  - 2 x daily - 7 x weekly - 1 sets - 3 reps - 45 hold - Supine Active Straight Leg Raise  - 2 x daily - 7 x weekly - 3 sets - 10 reps - Long Sitting Quad Set with Towel Roll Under Heel  - 5 x daily - 7 x weekly - 3 sets - 10 reps   ASTERISK SIGNS     Asterisk Signs Eval (12/21/2021) 12/21  01/10/22   01/12/22 1/16     Knee flex 50  76  90  99 105     L knee ext Lacking 12  7 6   5       30'' STS 5x w/ UE  Balance Tandem unable bil                            TREATMENT 01/24/22:  Therapeutic Exercise: - bike for ROM x 5 mins -  slant board stretch - x2' - heel raises on step - 3x15 - knee ext machine x15 bil, 2x10 2 up 1 down - HS curl - 45# - 3x10 - hurdle walking // step to and step through  - Lateral - lateral walking with GTB - Monster walk fwd bkwd GTB - Deep squat with UE support - 2x10 - step (next visit) - lunge (begin work soon)  Manual: - extension with OP - Flexion stretch EOT - CRC  Neuromuscular re-ed (Not Today): Tandem on foam - 45'' bouts DF/PF wooden rocker board   ASSESSMENT:   CLINICAL IMPRESSION: Wendell tolerated session well with no adverse reaction.  Demetry made significant progress with his flexion ROM.  After manual therapy his flexion reached 105 degrees.  We will continue working on his flexion, but focus will now be on normalizing gait as he still demonstrates an antalgic pattern.  Overall progressing as expected.    OBJECTIVE IMPAIRMENTS: Pain, L knee ROM, L knee and hip strength, gait, balance   ACTIVITY LIMITATIONS: bending, squatting, transfers, school, housework, ambulation   PERSONAL FACTORS: See medical history and pertinent history     REHAB POTENTIAL: Good   CLINICAL DECISION MAKING: Stable/uncomplicated   EVALUATION COMPLEXITY: Low     GOALS:     SHORT TERM GOALS: Target date: 01/18/2022   Hyun will be >75% HEP compliant to improve carryover between sessions and facilitate independent management of condition   Evaluation (12/21/2021): ongoing Goal status: MET Pt reports adherence 01/10/22     LONG TERM GOALS: Target date: 02/15/2022   King[place]id will show a >/= 40 pt improvement in their KOOS Jr. score (MCID is 15 pts) as a proxy for functional improvement    Evaluation/Baseline (12/21/2021): KOOS, JR. Score: 21 / 28, Interval Score: 34.174 / 100 Goal status: Progressing       2.  Ruth will achieve 110 degrees knee flexion to improve ability to complete transfers and navigate steps   Evaluation/Baseline (12/21/2021): 50 degrees Goal status:  Progressing       3.  Marshal will achieve 3 degrees knee extension to improve mechanics of gait   Evaluation/Baseline (12/21/2021): lacking 12 degrees Goal status: Progressing       4.  Tarrin will be able to stand for >30'' in SLS stance, to show a significant improvement in balance in order to reduce fall risk    Evaluation/Baseline (12/21/2021): tandem stance: unable Goal status: Progressing     5.  Ilijah will be able to return to [employer] school, not limited by pain   Evaluation/Baseline (12/21/2021): limited Goal status: Progressing       PLAN: PT FREQUENCY: 1-2x/week   PT DURATION: 8 weeks (Ending 02/15/2022)   PLANNED INTERVENTIONS: Therapeutic exercises, Aquatic therapy, Therapeutic activity, Neuro Muscular re-education, Gait training, Patient/Family education, Joint mobilization, Dry Needling, Electrical stimulation, Spinal mobilization and/or manipulation, Moist heat, Taping, Vasopneumatic device, Ionotophoresis 4mg /ml Dexamethasone, and Manual therapy   PLAN FOR NEXT SESSION: TKA progression as tolerated   Mathis Dad, PT 01/24/2022, 9:59 AM

## 2022-01-25 ENCOUNTER — Ambulatory Visit: Payer: Medicaid Other | Admitting: Family

## 2022-01-27 ENCOUNTER — Ambulatory Visit: Payer: Medicaid Other | Admitting: Physical Therapy

## 2022-02-03 ENCOUNTER — Encounter: Payer: Self-pay | Admitting: Physical Therapy

## 2022-02-03 ENCOUNTER — Ambulatory Visit: Payer: Medicaid Other | Admitting: Physical Therapy

## 2022-02-03 DIAGNOSIS — R2681 Unsteadiness on feet: Secondary | ICD-10-CM

## 2022-02-03 DIAGNOSIS — M25562 Pain in left knee: Secondary | ICD-10-CM

## 2022-02-03 DIAGNOSIS — M6281 Muscle weakness (generalized): Secondary | ICD-10-CM

## 2022-02-03 DIAGNOSIS — R600 Localized edema: Secondary | ICD-10-CM

## 2022-02-03 NOTE — Therapy (Signed)
OUTPATIENT PHYSICAL THERAPY TREATMENT NOTE   Patient Name: Dylan [name] MRN: [id number] DOB:1965/04/20, 57 y.o., male Today's Date: 02/03/2022  PCP: Charlott Rakes, MD  REFERRING PROVIDER: Newt Minion, MD   END OF SESSION:   PT End of Session - 02/03/22 1024     Visit Number 8    Date for PT Re-Evaluation 02/15/22    Authorization Type [place] MCD - KOOS Jr.    Authorization Time Period Submitted for 8 visits 01/26/22    PT Start Time 1030    PT Stop Time 1110    PT Time Calculation (min) 40 min    Activity Tolerance Patient tolerated treatment well    Behavior During Therapy WFL for tasks assessed/performed               Past Medical History:  Diagnosis Date   Anxiety    Arthritis    Bipolar 1 disorder ([place])    Colon polyps    Depression    GERD (gastroesophageal reflux disease)    Hepatitis B    Human immunodeficiency virus (HIV) ([place])    Hyperlipidemia    Hypertension    Neuromuscular disorder ([place])    neuropathy   Neuropathy    Pre-diabetes    Schizophrenia ([place])    Past Surgical History:  Procedure Laterality Date   FOOT ARTHRODESIS Right 09/17/2019   Procedure: FUSION RIGHT LISFRANC JOINT;  Surgeon: Newt Minion, MD;  Location: [place];  Service: Orthopedics;  Laterality: Right;   FOOT ARTHRODESIS Right 09/2019   HEMORROIDECTOMY     IR FLUORO GUIDE CV LINE RIGHT  02/22/2017   IR US GUIDE VASC ACCESS RIGHT  02/22/2017   TOTAL KNEE ARTHROPLASTY Left 12/14/2021   Procedure: LEFT TOTAL KNEE ARTHROPLASTY;  Surgeon: Newt Minion, MD;  Location: [place];  Service: Orthopedics;  Laterality: Left;   TUMOR REMOVAL     From Chest   Patient Active Problem List   Diagnosis Date Noted   S/P total knee arthroplasty, left 12/14/2021   Unilateral primary osteoarthritis, left knee 12/14/2021   Total knee replacement status, left 12/14/2021   Intentional drug overdose ([place]) 11/26/2021   Chronic fatigue 11/16/2021   Abnormal EKG 11/16/2021   Overweight (BMI  25.0-29.9) 11/16/2021   Transaminitis 07/03/2021   Right foot ulcer ([place]) 07/03/2021   Onychomycosis 04/19/2021   Cocaine abuse ([place]) 03/16/2021   Bipolar I disorder, most recent episode depressed ([place]) 03/15/2021   Bipolar 1 disorder, depressed ([place]) 03/15/2021   Nodular radiologic density 02/02/2021   Lesion of liver 02/02/2021   Liver disease 02/02/2021   Vitamin D deficiency 01/18/2021   Alcohol abuse 01/17/2021   Methamphetamine abuse ([place]) 01/17/2021   Lesion of pancreas 01/17/2021   Substance induced mood disorder ([place]) 10/26/2020   Fatty liver 03/09/2020   Schizophrenia ([place]) 03/08/2020   Generalized anxiety disorder 02/03/2020   Insomnia due to other mental disorder 02/03/2020   Lisfranc dislocation, right, initial encounter    Prediabetes 07/22/2019   Tobacco abuse 07/10/2018   Schizoaffective disorder, bipolar type ([place])    Acute renal failure (ARF) ([place])    Suicidal ideations    Chronic 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 coma ([place])    AKI (acute kidney injury) ([place]) 02/19/2017   Rhabdomyolysis 02/19/2017   Suicide attempt by acetaminophen overdose ([place]) 02/19/2017   Acute hepatitis 02/19/2017   Elevated LFTs    Schizoaffective disorder, depressive type ([place]) 09/05/2011   Degenerative disc disease 08/21/2011   HIV disease (  [place]) 08/17/2011    REFERRING DIAG: Status post total knee replacement, left [Z96.652]  (12/6)  THERAPY DIAG:  Acute pain of left knee  Muscle weakness  Unsteadiness on feet  Localized edema  Rationale for Evaluation and Treatment Rehabilitation  PERTINENT HISTORY: Hepatitis B, HIV, bipolar, substance abuse (cocaine, methamphetamine, EtOH), bipolar, schizoaffective disorder, chronic fatigue, Lisfranc dislocation R with arthrodesis 09/17/19; suicidal ideations, DDD, and arthritis.    PRECAUTIONS: Fall  SUBJECTIVE:                                                                                                                                                                                       SUBJECTIVE STATEMENT:   Pt reports that overall he is seeing significant improvement in his knee pain.  He did fatigue during one of his classes which required extensive walking.    PAIN:  Are you having pain? Yes Pain location: Diffuse L knee pain  NPRS scale:  0/10 Aggravating factors: activity but can occur without activity as well Relieving factors: Ice Pain description: intermittent, constant, sharp, and aching Stage: Acute Stability: getting better 24 hour pattern: NA    OBJECTIVE: (objective measures completed at initial evaluation unless otherwise dated)   DIAGNOSTIC FINDINGS:  NA             GENERAL OBSERVATION/GAIT:                     Slow antalgic gait using quad cane, reduced step length R reduced time in stance L, reduced swing L    SENSATION:          Light touch: Appears intact   PALPATION: Staples in place, wound appears well healing, no streaking or redness, moderate edema, calf supple and non-tender   MUSCLE LENGTH: Hamstrings: Right subtle restriction; Left significant restriction ASLR: Right ASLR = PSLR; Left ASLR significantly reduced compared to PSLR      LE MMT:   MMT Right 12/21/2021 Left 12/21/2021  Hip flexion (L2, L3) 4 3-  Knee extension (L3) 4+ 3-*  Knee flexion 4+ 3*  Hip abduction      Hip extension      Hip external rotation      Hip internal rotation      Hip adduction      Ankle dorsiflexion (L4)      Ankle plantarflexion (S1)      Ankle inversion      Ankle eversion      Great Toe ext (L5)      Grossly        (Blank rows = not tested, score listed is out of 5 possible points.  N = WNL, D = diminished, C = clear for gross weakness with myotome testing, * = concordant pain with testing)   LE ROM:   ROM Right 12/21/2021 Left 12/21/2021 Left 12/27/21 Left 01/10/22 Left 01/12/22  Hip flexion         Hip extension         Hip abduction         Hip  adduction         Hip internal rotation         Hip external rotation         Knee flexion Full >130 degrees 50 71 90 99  Knee extension 0 Lacking 12 Lacking 7 Lacking 6 Lacking 5  Ankle dorsiflexion         Ankle plantarflexion         Ankle inversion         Ankle eversion           (Blank rows = not tested, N = WNL, * = concordant pain with testing)   Functional Tests   Eval (12/21/2021)      Progressive balance screen (highest level completed for >/= 10''):   Feet together: 10'' Semi Tandem: R in rear 10'', L in rear 10'' Tandem: R in rear unable, L in rear 4''        30'' STS: 5x  UE used? Y                                                                                                PATIENT SURVEYS:  Koos Jr. - KOOS, JR. Score: 21 / 28, Interval Score: 34.174 / 100     TODAY'S TREATMENT: Creating, reviewing, and completing below HEP     PATIENT EDUCATION:  POC, diagnosis, prognosis, HEP, and outcome measures.  Pt educated via explanation, demonstration, and handout (HEP).  Pt confirms understanding verbally.    HOME EXERCISE PROGRAM: Access Code: [email or web address] URL: [email or web address] Date: 02/03/2022 Prepared by: Shearon Balo  Exercises - Standing Knee Flexion Stretch on Step  - 2 x daily - 7 x weekly - 1 sets - 20 reps - Step Up  - 1 x daily - 7 x weekly - 3 sets - 10 reps - Lateral Step Up  - 1 x daily - 7 x weekly - 3 sets - 10 reps - Single Leg Stance  - 2 x daily - 6 x weekly - 1 sets - 3 reps - 30 hold   ASTERISK SIGNS     Asterisk Signs Eval (12/21/2021) 12/21  01/10/22   01/12/22 1/16  1/26    Knee flex 50  76  90  99 105  108    L knee ext Lacking 12  7 6   5        30'' STS 5x w/ UE             Balance Tandem unable bil          5'' SLS                    TREATMENT:  Therapeutic Exercise: - bike for ROM x 5 mins - progressively closer - slant board stretch - x2' - heel raises on step - 3x15 (not today) - knee  ext machine 15 - hurdle walking // step through - fwd and backwards  - Lateral - s/l leg press - 2x10 @ 55# - Deep squat with UE support - 2x10 - step - 4'' - 2x10 fwd and lat - lunge to 6'' - 2x10 ea  Neuromuscular re-ed: Airex balance beam walking SLS   ASSESSMENT:   CLINICAL IMPRESSION: Hurman tolerated session well with no adverse reaction.  Kaelem is progressing as expected with improved flexion ROM and knee ext strength.  Began work on [employer] with squat and lunge.  HEP updated.    OBJECTIVE IMPAIRMENTS: Pain, L knee ROM, L knee and hip strength, gait, balance   ACTIVITY LIMITATIONS: bending, squatting, transfers, school, housework, ambulation   PERSONAL FACTORS: See medical history and pertinent history     REHAB POTENTIAL: Good   CLINICAL DECISION MAKING: Stable/uncomplicated   EVALUATION COMPLEXITY: Low     GOALS:     SHORT TERM GOALS: Target date: 01/18/2022   Dominque will be >75% HEP compliant to improve carryover between sessions and facilitate independent management of condition   Evaluation (12/21/2021): ongoing Goal status: MET Pt reports adherence 01/10/22     LONG TERM GOALS: Target date: 02/15/2022   Jaidon will show a >/= 40 pt improvement in their KOOS Jr. score (MCID is 15 pts) as a proxy for functional improvement    Evaluation/Baseline (12/21/2021): KOOS, JR. Score: 21 / 28, Interval Score: 34.174 / 100 Goal status: Progressing       2.  Linden will achieve 110 degrees knee flexion to improve ability to complete transfers and navigate steps   Evaluation/Baseline (12/21/2021): 50 degrees Goal status: Progressing       3.  Stevens will achieve 3 degrees knee extension to improve mechanics of gait   Evaluation/Baseline (12/21/2021): lacking 12 degrees Goal status: Progressing       4.  Barnett will be able to stand for >30'' in SLS stance, to show a significant improvement in balance in order to reduce fall risk     Evaluation/Baseline (12/21/2021): tandem stance: unable Goal status: Progressing     5.  Kallon will be able to return to [employer] school, not limited by pain   Evaluation/Baseline (12/21/2021): limited Goal status: Progressing       PLAN: PT FREQUENCY: 1-2x/week   PT DURATION: 8 weeks (Ending 02/15/2022)   PLANNED INTERVENTIONS: Therapeutic exercises, Aquatic therapy, Therapeutic activity, Neuro Muscular re-education, Gait training, Patient/Family education, Joint mobilization, Dry Needling, Electrical stimulation, Spinal mobilization and/or manipulation, Moist heat, Taping, Vasopneumatic device, Ionotophoresis 4mg /ml Dexamethasone, and Manual therapy   PLAN FOR NEXT SESSION: TKA progression as tolerated   Mathis Dad, PT 02/03/2022, 11:10 AM

## 2022-02-07 ENCOUNTER — Inpatient Hospital Stay: Admission: RE | Admit: 2022-02-07 | Payer: Medicaid Other | Source: Ambulatory Visit

## 2022-02-08 ENCOUNTER — Telehealth: Payer: Self-pay | Admitting: Family

## 2022-02-08 ENCOUNTER — Encounter: Payer: Medicaid Other | Admitting: Physical Therapy

## 2022-02-08 NOTE — Telephone Encounter (Signed)
Pt called requesting that he need a letter for school they he can do limited activities. Pt state he has surgery a few weeks ago. Pt is asking to pick letter up today if possible. Please call pt about this matter at [phone].

## 2022-02-09 ENCOUNTER — Telehealth: Payer: Self-pay | Admitting: Orthopedic Surgery

## 2022-02-09 ENCOUNTER — Other Ambulatory Visit: Payer: Self-pay

## 2022-02-09 NOTE — Telephone Encounter (Signed)
Pt called about a letter. He states that it is urgent that this letter is needed for school. Please call pt when ready for pick up. Phone number is [phone]

## 2022-02-09 NOTE — Telephone Encounter (Signed)
Duplicate message this has been done.  ?

## 2022-02-09 NOTE — Telephone Encounter (Signed)
Called pt to advise that the letter he requested is available for pick up at the front desk whenever he is able. Lm on vm to call with any questions.

## 2022-02-10 ENCOUNTER — Ambulatory Visit: Payer: Medicaid Other | Attending: Orthopedic Surgery | Admitting: Physical Therapy

## 2022-02-10 ENCOUNTER — Encounter: Payer: Self-pay | Admitting: Physical Therapy

## 2022-02-10 DIAGNOSIS — R600 Localized edema: Secondary | ICD-10-CM | POA: Insufficient documentation

## 2022-02-10 DIAGNOSIS — R2681 Unsteadiness on feet: Secondary | ICD-10-CM | POA: Insufficient documentation

## 2022-02-10 DIAGNOSIS — M6281 Muscle weakness (generalized): Secondary | ICD-10-CM | POA: Insufficient documentation

## 2022-02-10 DIAGNOSIS — M25562 Pain in left knee: Secondary | ICD-10-CM | POA: Insufficient documentation

## 2022-02-10 NOTE — Therapy (Signed)
Pt checked in early and then left without letting front desk known.

## 2022-03-02 ENCOUNTER — Telehealth (HOSPITAL_COMMUNITY): Payer: Self-pay

## 2022-03-06 ENCOUNTER — Other Ambulatory Visit: Payer: Self-pay | Admitting: Internal Medicine

## 2022-03-06 DIAGNOSIS — F411 Generalized anxiety disorder: Secondary | ICD-10-CM

## 2022-03-17 ENCOUNTER — Encounter: Payer: Self-pay | Admitting: Internal Medicine

## 2022-03-17 MED ORDER — METHOCARBAMOL 500 MG PO TABS: 500.0000 mg | ORAL_TABLET | Freq: Four times a day (QID) | ORAL | 0 refills | Status: DC | PRN

## 2022-03-17 MED ORDER — IBUPROFEN 800 MG PO TABS: 800.0000 mg | ORAL_TABLET | Freq: Three times a day (TID) | ORAL | 2 refills | Status: DC | PRN

## 2022-03-20 ENCOUNTER — Encounter: Payer: Self-pay | Admitting: Family Medicine

## 2022-03-20 ENCOUNTER — Other Ambulatory Visit: Payer: Self-pay | Admitting: Family Medicine

## 2022-03-20 ENCOUNTER — Ambulatory Visit: Payer: Self-pay

## 2022-03-20 DIAGNOSIS — F411 Generalized anxiety disorder: Secondary | ICD-10-CM

## 2022-03-20 MED ORDER — GABAPENTIN 300 MG PO CAPS: 600.0000 mg | ORAL_CAPSULE | Freq: Three times a day (TID) | ORAL | 3 refills | Status: DC

## 2022-03-20 NOTE — Telephone Encounter (Signed)
  Chief Complaint: Rash - itching Symptoms: rash on legs and arms. Little black dots Frequency: 3 months Pertinent Negatives: Patient denies fever, facial swelling Disposition: [] ED /[x] Urgent Care (no appt availability in office) / [] Appointment(In office/virtual)/ []  [facility]/ [] Home Care/ [] Refused Recommended Disposition /[] [facility]/ []  Follow-up with PCP Additional Notes: PT has had an itchy rash on his legs for 3 months. Now it is starting on his arms. Pt will go to UC if needed.  PT would rather come into the office. Reviewed s/s of allergic reaction requiring EMS. Pt will try OTC antihistamines. Pt has already tried hydrocortisone with some relief.   Reason for Disposition  SEVERE itching (i.e., interferes with sleep, normal activities or school)  Answer Assessment - Initial Assessment Questions 1. APPEARANCE of RASH: "Describe the rash." (e.g., spots, blisters, raised areas, skin peeling, scaly)     Little black raised spots 2. SIZE: "How big are the spots?" (e.g., tip of pen, eraser, coin; inches, centimeters)     small 3. LOCATION: "Where is the rash located?"     Legs and arms 4. COLOR: "What color is the rash?" (Note: It is difficult to assess rash color in people with darker-colored skin. When this situation occurs, simply ask the caller to describe what they see.)     small 5. ONSET: "When did the rash begin?"     3 months 6. FEVER: "Do you have a fever?" If Yes, ask: "What is your temperature, how was it measured, and when did it start?"     no 7. ITCHING: "Does the rash itch?" If Yes, ask: "How bad is the itch?" (Scale 1-10; or mild, moderate, severe)     10/10 8. CAUSE: "What do you think is causing the rash?"      9. MEDICINE FACTORS: "Have you started any new medicines within the last 2 weeks?" (e.g., antibiotics)      no 10. OTHER SYMPTOMS: "Do you have any other symptoms?" (e.g., dizziness, headache, sore throat, joint pain)        no  Protocols used: Rash or Redness - [facility]

## 2022-03-29 ENCOUNTER — Ambulatory Visit: Payer: Medicaid Other | Admitting: Physician Assistant

## 2022-03-30 ENCOUNTER — Ambulatory Visit (HOSPITAL_COMMUNITY)
Admission: EM | Admit: 2022-03-30 | Discharge: 2022-03-31 | Disposition: A | Discharge status: Other Acute Inpatient Hospital | Payer: Medicaid Other | Attending: Family | Admitting: Family

## 2022-03-30 ENCOUNTER — Other Ambulatory Visit: Payer: Self-pay

## 2022-03-30 DIAGNOSIS — Z1152 Encounter for screening for COVID-19: Secondary | ICD-10-CM | POA: Insufficient documentation

## 2022-03-30 DIAGNOSIS — F251 Schizoaffective disorder, depressive type: Secondary | ICD-10-CM

## 2022-03-30 DIAGNOSIS — G4700 Insomnia, unspecified: Secondary | ICD-10-CM | POA: Diagnosis not present

## 2022-03-30 DIAGNOSIS — F32A Depression, unspecified: Secondary | ICD-10-CM | POA: Insufficient documentation

## 2022-03-30 DIAGNOSIS — T1491XA Suicide attempt, initial encounter: Secondary | ICD-10-CM | POA: Insufficient documentation

## 2022-03-30 DIAGNOSIS — Z79899 Other long term (current) drug therapy: Secondary | ICD-10-CM | POA: Insufficient documentation

## 2022-03-30 DIAGNOSIS — X810XXA Intentional self-harm by jumping or lying in front of motor vehicle, initial encounter: Secondary | ICD-10-CM | POA: Insufficient documentation

## 2022-03-30 DIAGNOSIS — F1490 Cocaine use, unspecified, uncomplicated: Secondary | ICD-10-CM

## 2022-03-30 LAB — COMPREHENSIVE METABOLIC PANEL
ALT: 50 U/L — ABNORMAL HIGH (ref 0–44)
AST: 99 U/L — ABNORMAL HIGH (ref 15–41)
Albumin: 4.8 g/dL (ref 3.5–5.0)
Alkaline Phosphatase: 119 U/L (ref 38–126)
Anion gap: 13 (ref 5–15)
BUN: 17 mg/dL (ref 6–20)
CO2: 25 mmol/L (ref 22–32)
Calcium: 10.2 mg/dL (ref 8.9–10.3)
Chloride: 96 mmol/L — ABNORMAL LOW (ref 98–111)
Creatinine, Ser: 1.09 mg/dL (ref 0.61–1.24)
GFR, Estimated: >60 mL/min (ref >60)
Glucose, Bld: 74 mg/dL (ref 70–99)
Potassium: 3.7 mmol/L (ref 3.5–5.1)
Sodium: 134 mmol/L — ABNORMAL LOW (ref 135–145)
Total Bilirubin: 1 mg/dL (ref 0.3–1.2)
Total Protein: 8.1 g/dL (ref 6.5–8.1)

## 2022-03-30 LAB — CBC WITH DIFFERENTIAL/PLATELET
Abs Immature Granulocytes: 0.02 10*3/uL (ref 0.00–0.07)
Basophils Absolute: 0 10*3/uL (ref 0.0–0.1)
Basophils Relative: 0 %
Eosinophils Absolute: 0.1 10*3/uL (ref 0.0–0.5)
Eosinophils Relative: 1 %
HCT: 45.2 % (ref 39.0–52.0)
Hemoglobin: 15 g/dL (ref 13.0–17.0)
Immature Granulocytes: 0 %
Lymphocytes Relative: 29 %
Lymphs Abs: 2.5 10*3/uL (ref 0.7–4.0)
MCH: 29.8 pg (ref 26.0–34.0)
MCHC: 33.2 g/dL (ref 30.0–36.0)
MCV: 89.9 fL (ref 80.0–100.0)
Monocytes Absolute: 0.9 10*3/uL (ref 0.1–1.0)
Monocytes Relative: 10 %
Neutro Abs: 5.2 10*3/uL (ref 1.7–7.7)
Neutrophils Relative %: 60 %
Platelets: 188 10*3/uL (ref 150–400)
RBC: 5.03 MIL/uL (ref 4.22–5.81)
RDW: 13.2 % (ref 11.5–15.5)
WBC: 8.7 10*3/uL (ref 4.0–10.5)
nRBC: 0 % (ref 0.0–0.2)

## 2022-03-30 LAB — POC SARS CORONAVIRUS 2 AG: SARSCOV2ONAVIRUS 2 AG: NEGATIVE

## 2022-03-30 LAB — RESP PANEL BY RT-PCR (RSV, FLU A&B, COVID)  RVPGX2
Influenza A by PCR: NEGATIVE
Influenza B by PCR: NEGATIVE
Resp Syncytial Virus by PCR: NEGATIVE
SARS Coronavirus 2 by RT PCR: NEGATIVE

## 2022-03-30 LAB — TSH: TSH: 1.014 u[IU]/mL (ref 0.350–4.500)

## 2022-03-30 LAB — MAGNESIUM: Magnesium: 2.5 mg/dL — ABNORMAL HIGH (ref 1.7–2.4)

## 2022-03-30 LAB — ETHANOL: Alcohol, Ethyl (B): 22 mg/dL — ABNORMAL HIGH (ref <10)

## 2022-03-30 MED ORDER — ALUM & MAG HYDROXIDE-SIMETH 200-200-20 MG/5ML PO SUSP: 30.0000 mL | ORAL | Status: DC | PRN

## 2022-03-30 MED ORDER — MAGNESIUM HYDROXIDE 400 MG/5ML PO SUSP: 30.0000 mL | Freq: Every day | ORAL | Status: DC | PRN

## 2022-03-30 MED ORDER — QUETIAPINE FUMARATE 50 MG PO TABS
150.0000 mg | ORAL_TABLET | Freq: Every day | ORAL | Status: DC
  Administered 2022-03-30: 150 mg via ORAL
  Filled 2022-03-30: qty 3

## 2022-03-30 MED ORDER — ROSUVASTATIN CALCIUM 20 MG PO TABS
20.0000 mg | ORAL_TABLET | Freq: Every day | ORAL | Status: DC
  Administered 2022-03-30 – 2022-03-31 (×2): 20 mg via ORAL
  Filled 2022-03-30 (×2): qty 1

## 2022-03-30 MED ORDER — NICOTINE 14 MG/24HR TD PT24
14.0000 mg | MEDICATED_PATCH | Freq: Every day | TRANSDERMAL | Status: DC
  Administered 2022-03-31: 14 mg via TRANSDERMAL
  Filled 2022-03-30: qty 1

## 2022-03-30 MED ORDER — METHOCARBAMOL 500 MG PO TABS: 500.0000 mg | ORAL_TABLET | Freq: Four times a day (QID) | ORAL | Status: DC | PRN

## 2022-03-30 MED ORDER — TRAZODONE HCL 150 MG PO TABS
150.0000 mg | ORAL_TABLET | Freq: Every evening | ORAL | Status: DC | PRN
  Administered 2022-03-30: 150 mg via ORAL
  Filled 2022-03-30: qty 1

## 2022-03-30 MED ORDER — GABAPENTIN 300 MG PO CAPS
600.0000 mg | ORAL_CAPSULE | Freq: Three times a day (TID) | ORAL | Status: DC
  Administered 2022-03-30 – 2022-03-31 (×2): 600 mg via ORAL
  Filled 2022-03-30 (×2): qty 2

## 2022-03-30 MED ORDER — PANTOPRAZOLE SODIUM 40 MG PO TBEC
80.0000 mg | DELAYED_RELEASE_TABLET | Freq: Every day | ORAL | Status: DC
  Administered 2022-03-31: 80 mg via ORAL
  Filled 2022-03-30: qty 2

## 2022-03-30 MED ORDER — HYDROXYZINE HCL 25 MG PO TABS
25.0000 mg | ORAL_TABLET | Freq: Three times a day (TID) | ORAL | Status: DC | PRN
  Filled 2022-03-30: qty 1

## 2022-03-30 MED ORDER — ACETAMINOPHEN 325 MG PO TABS: 650.0000 mg | ORAL_TABLET | Freq: Four times a day (QID) | ORAL | Status: DC | PRN

## 2022-03-30 MED ORDER — BICTEGRAVIR-EMTRICITAB-TENOFOV 50-200-25 MG PO TABS
1.0000 | ORAL_TABLET | Freq: Every day | ORAL | Status: DC
  Administered 2022-03-31: 1 via ORAL
  Filled 2022-03-30: qty 1
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
## 2022-03-30 NOTE — ED Notes (Signed)
Pt reports SOB, vital signs obtained, Skin color good, no respiratory distress noted.  Will continue to monitor.
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
## 2022-03-30 NOTE — ED Notes (Signed)
Pt A&O x 4, presents with suicidal ideations, pt walked in front of bus downtown to kill self.  Depressed and overwhelmed with life.  Pt calm & cooperative at present.  Comfort measures given.  Monitoring for safety.

## 2022-03-30 NOTE — ED Notes (Signed)
Pt sleeping@this time. Breathing even and unlabored. Will continue to monitor for safety 

## 2022-03-31 ENCOUNTER — Inpatient Hospital Stay (HOSPITAL_COMMUNITY)
Admission: AD | Admit: 2022-03-31 | Discharge: 2022-04-04 | DRG: 885 | Disposition: A | Discharge status: Home / Self Care | Payer: Medicaid Other | Source: Intra-hospital | Attending: Emergency Medicine | Admitting: Emergency Medicine

## 2022-03-31 ENCOUNTER — Encounter (HOSPITAL_COMMUNITY): Payer: Self-pay | Admitting: Family

## 2022-03-31 DIAGNOSIS — B20 Human immunodeficiency virus [HIV] disease: Secondary | ICD-10-CM | POA: Diagnosis present

## 2022-03-31 DIAGNOSIS — Z79899 Other long term (current) drug therapy: Secondary | ICD-10-CM

## 2022-03-31 DIAGNOSIS — Z21 Asymptomatic human immunodeficiency virus [HIV] infection status: Secondary | ICD-10-CM | POA: Diagnosis present

## 2022-03-31 DIAGNOSIS — Z91128 Patient's intentional underdosing of medication regimen for other reason: Secondary | ICD-10-CM

## 2022-03-31 DIAGNOSIS — R45851 Suicidal ideations: Secondary | ICD-10-CM | POA: Diagnosis present

## 2022-03-31 DIAGNOSIS — F3130 Bipolar disorder, current episode depressed, mild or moderate severity, unspecified: Principal | ICD-10-CM | POA: Diagnosis present

## 2022-03-31 DIAGNOSIS — R7989 Other specified abnormal findings of blood chemistry: Secondary | ICD-10-CM | POA: Diagnosis present

## 2022-03-31 DIAGNOSIS — Z833 Family history of diabetes mellitus: Secondary | ICD-10-CM | POA: Diagnosis not present

## 2022-03-31 DIAGNOSIS — Z823 Family history of stroke: Secondary | ICD-10-CM

## 2022-03-31 DIAGNOSIS — E785 Hyperlipidemia, unspecified: Secondary | ICD-10-CM | POA: Diagnosis present

## 2022-03-31 DIAGNOSIS — F1410 Cocaine abuse, uncomplicated: Secondary | ICD-10-CM | POA: Diagnosis present

## 2022-03-31 DIAGNOSIS — F419 Anxiety disorder, unspecified: Secondary | ICD-10-CM | POA: Diagnosis present

## 2022-03-31 DIAGNOSIS — F17200 Nicotine dependence, unspecified, uncomplicated: Secondary | ICD-10-CM | POA: Diagnosis present

## 2022-03-31 DIAGNOSIS — T50916A Underdosing of multiple unspecified drugs, medicaments and biological substances, initial encounter: Secondary | ICD-10-CM | POA: Diagnosis present

## 2022-03-31 DIAGNOSIS — Z720 Tobacco use: Secondary | ICD-10-CM | POA: Diagnosis present

## 2022-03-31 DIAGNOSIS — Z8249 Family history of ischemic heart disease and other diseases of the circulatory system: Secondary | ICD-10-CM

## 2022-03-31 DIAGNOSIS — F1510 Other stimulant abuse, uncomplicated: Secondary | ICD-10-CM | POA: Diagnosis present

## 2022-03-31 DIAGNOSIS — F4310 Post-traumatic stress disorder, unspecified: Secondary | ICD-10-CM | POA: Diagnosis present

## 2022-03-31 DIAGNOSIS — K219 Gastro-esophageal reflux disease without esophagitis: Secondary | ICD-10-CM | POA: Diagnosis present

## 2022-03-31 DIAGNOSIS — F1590 Other stimulant use, unspecified, uncomplicated: Secondary | ICD-10-CM | POA: Diagnosis present

## 2022-03-31 DIAGNOSIS — Z91040 Latex allergy status: Secondary | ICD-10-CM

## 2022-03-31 DIAGNOSIS — Z9151 Personal history of suicidal behavior: Secondary | ICD-10-CM

## 2022-03-31 DIAGNOSIS — Z91148 Patient's other noncompliance with medication regimen for other reason: Secondary | ICD-10-CM

## 2022-03-31 DIAGNOSIS — I10 Essential (primary) hypertension: Secondary | ICD-10-CM | POA: Diagnosis present

## 2022-03-31 DIAGNOSIS — Z880 Allergy status to penicillin: Secondary | ICD-10-CM

## 2022-03-31 DIAGNOSIS — F1010 Alcohol abuse, uncomplicated: Secondary | ICD-10-CM | POA: Diagnosis present

## 2022-03-31 DIAGNOSIS — F5105 Insomnia due to other mental disorder: Secondary | ICD-10-CM | POA: Diagnosis present

## 2022-03-31 DIAGNOSIS — M25562 Pain in left knee: Principal | ICD-10-CM

## 2022-03-31 DIAGNOSIS — F251 Schizoaffective disorder, depressive type: Principal | ICD-10-CM | POA: Diagnosis present

## 2022-03-31 DIAGNOSIS — F17290 Nicotine dependence, other tobacco product, uncomplicated: Secondary | ICD-10-CM | POA: Diagnosis present

## 2022-03-31 DIAGNOSIS — Z8719 Personal history of other diseases of the digestive system: Secondary | ICD-10-CM

## 2022-03-31 DIAGNOSIS — Z818 Family history of other mental and behavioral disorders: Secondary | ICD-10-CM

## 2022-03-31 DIAGNOSIS — F411 Generalized anxiety disorder: Secondary | ICD-10-CM | POA: Diagnosis present

## 2022-03-31 DIAGNOSIS — Z96652 Presence of left artificial knee joint: Secondary | ICD-10-CM

## 2022-03-31 HISTORY — DX: Schizoaffective disorder, bipolar type: F25.0

## 2022-03-31 MED ORDER — ACETAMINOPHEN 325 MG PO TABS
650.0000 mg | ORAL_TABLET | Freq: Four times a day (QID) | ORAL | Status: DC | PRN
  Administered 2022-03-31 – 2022-04-04 (×5): 650 mg via ORAL
  Filled 2022-03-31 (×5): qty 2

## 2022-03-31 MED ORDER — GABAPENTIN 300 MG PO CAPS
600.0000 mg | ORAL_CAPSULE | Freq: Three times a day (TID) | ORAL | Status: DC
  Administered 2022-03-31 – 2022-04-04 (×12): 600 mg via ORAL
  Filled 2022-03-31 (×21): qty 2

## 2022-03-31 MED ORDER — DIPHENHYDRAMINE HCL 50 MG/ML IJ SOLN: 50.0000 mg | Freq: Three times a day (TID) | INTRAMUSCULAR | Status: DC | PRN

## 2022-03-31 MED ORDER — QUETIAPINE FUMARATE 50 MG PO TABS
150.0000 mg | ORAL_TABLET | Freq: Every day | ORAL | Status: DC
  Administered 2022-03-31: 150 mg via ORAL
  Filled 2022-03-31 (×3): qty 1

## 2022-03-31 MED ORDER — TRAZODONE HCL 150 MG PO TABS
150.0000 mg | ORAL_TABLET | Freq: Every evening | ORAL | Status: DC | PRN
  Administered 2022-03-31: 150 mg via ORAL
  Filled 2022-03-31: qty 1

## 2022-03-31 MED ORDER — PANTOPRAZOLE SODIUM 40 MG PO TBEC
80.0000 mg | DELAYED_RELEASE_TABLET | Freq: Every day | ORAL | Status: DC
  Administered 2022-04-01: 80 mg via ORAL
  Filled 2022-03-31 (×3): qty 2

## 2022-03-31 MED ORDER — ROSUVASTATIN CALCIUM 20 MG PO TABS
20.0000 mg | ORAL_TABLET | Freq: Every day | ORAL | Status: DC
  Administered 2022-04-01 – 2022-04-04 (×4): 20 mg via ORAL
  Filled 2022-03-31 (×7): qty 1

## 2022-03-31 MED ORDER — HALOPERIDOL 5 MG PO TABS: 5.0000 mg | ORAL_TABLET | Freq: Three times a day (TID) | ORAL | Status: DC | PRN

## 2022-03-31 MED ORDER — LORAZEPAM 2 MG/ML IJ SOLN: 2.0000 mg | Freq: Three times a day (TID) | INTRAMUSCULAR | Status: DC | PRN

## 2022-03-31 MED ORDER — HALOPERIDOL LACTATE 5 MG/ML IJ SOLN: 5.0000 mg | Freq: Three times a day (TID) | INTRAMUSCULAR | Status: DC | PRN

## 2022-03-31 MED ORDER — ALUM & MAG HYDROXIDE-SIMETH 200-200-20 MG/5ML PO SUSP: 30.0000 mL | ORAL | Status: DC | PRN

## 2022-03-31 MED ORDER — MAGNESIUM HYDROXIDE 400 MG/5ML PO SUSP: 30.0000 mL | Freq: Every day | ORAL | Status: DC | PRN

## 2022-03-31 MED ORDER — HYDROXYZINE HCL 25 MG PO TABS: 25.0000 mg | ORAL_TABLET | Freq: Three times a day (TID) | ORAL | Status: DC | PRN

## 2022-03-31 MED ORDER — BICTEGRAVIR-EMTRICITAB-TENOFOV 50-200-25 MG PO TABS
1.0000 | ORAL_TABLET | Freq: Every day | ORAL | Status: DC
  Administered 2022-04-01 – 2022-04-04 (×4): 1 via ORAL
  Filled 2022-03-31 (×7): qty 1

## 2022-03-31 MED ORDER — DIPHENHYDRAMINE HCL 25 MG PO CAPS: 50.0000 mg | ORAL_CAPSULE | Freq: Three times a day (TID) | ORAL | Status: DC | PRN

## 2022-03-31 MED ORDER — METHOCARBAMOL 500 MG PO TABS
500.0000 mg | ORAL_TABLET | Freq: Four times a day (QID) | ORAL | Status: DC | PRN
  Administered 2022-03-31 – 2022-04-02 (×3): 500 mg via ORAL
  Filled 2022-03-31 (×3): qty 1

## 2022-03-31 MED ORDER — LORAZEPAM 1 MG PO TABS: 2.0000 mg | ORAL_TABLET | Freq: Three times a day (TID) | ORAL | Status: DC | PRN

## 2022-03-31 MED ORDER — NICOTINE 14 MG/24HR TD PT24
14.0000 mg | MEDICATED_PATCH | Freq: Every day | TRANSDERMAL | Status: DC
  Administered 2022-04-01 – 2022-04-04 (×4): 14 mg via TRANSDERMAL
  Filled 2022-03-31 (×7): qty 1

## 2022-03-31 NOTE — ED Notes (Signed)
Report called to nurse Jillian at [facility]. VS rechecked and safe transport called.

## 2022-03-31 NOTE — ED Notes (Signed)
Pt sleeping at present, no distress noted.  Monitoring for safety. 

## 2022-03-31 NOTE — Group Note (Signed)
Date:  03/31/2022 Time:  2:35 PM  Group Topic/Focus:  Spirituality:   The focus of this group is to discuss how one's spirituality can aide in recovery.    Participation Level:  Patient wasn't admitted at the time of this group.   Dimitrios Balestrieri W Braiden Presutti 0000000, 2:35 PM

## 2022-03-31 NOTE — Progress Notes (Signed)
Did not attend group 

## 2022-03-31 NOTE — Progress Notes (Signed)
Per [facility] AC Tosin, RN pt is under review at [facility]. Pt meets inpatient behavioral health placement per Beatriz Stallion, FNP.  First shift to follow up due to CSW shift ending.   Benjaman Kindler, MSW, [facility] 03/31/2022 12:28 AM

## 2022-03-31 NOTE — Progress Notes (Signed)
Pt was accepted to [facility] 03/31/2022. Bed assignment: [id number]  Pt meets inpatient criteria per Beatriz Stallion, NP  Attending Physician will be Janine Limbo, MD  Report can be called to: - Adult unit: [phone]  Pt can arrive after 1 PM  Care Team Notified: [place], RN, Beatriz Stallion, NP, Lily Kocher, LPN, and [address], LCSWA  [place], [place]  03/31/2022 11:56 AM

## 2022-03-31 NOTE — Progress Notes (Signed)
Pt admitted to [facility] under voluntary status from [facility] where he presented initially with [employer] after walking in front of a bus downtown to kill himself. Per chart review and nursing report pt has a diagnosis of Schizoaffective disorder, bipolar type with worsening depressive symptoms; including dreams of his 57 y/o daughter who passed away 4 years ago and SI. Reports being off his medications (Seroquel & Trazodone) for the past month with polysubstance abuse of both etoh and cocaine. He presents disheveled, unkept with body order, irritable, agitated with avertive eye contact, oriented to person, place and events. Confirms history of substance abuse "I've doing cocaine on and off for years now and I drink beer. I last drank and did cocaine like 2 days ago". Refused to cooperate with admission assessment "I don't want to keep repeating myself, it's all in the chart. I don't want to tale". Skin assessment done with increased prompts. Rash noted to left arm, with surgical scar to left knee "I had knee replacement 2 months ago". Endorsed passive SI, verbally contracts for safety. Denies HI, AVH and pain when assessed. Ambulatory to unit with a slow and unsteady gait related to left knee surgery. Q 15 minutes safety checks and fall precaution initiated without incident. Unit orientation done, routines discussed, care plan reviewed with pt and admission documents signed. Emotional support, reassurance and encouragement offered. Pt verbalized understanding of unit expectations / routines. Tolerated sandwich tray and fluids well. Safety maintained in milieu.

## 2022-03-31 NOTE — ED Notes (Signed)
Patient  transported to [facility] via Civil [employer], contracting.

## 2022-03-31 NOTE — Group Note (Signed)
Date:  03/31/2022 Time:  9:28 PM  Group Topic/Focus:  Wrap-Up Group:   The focus of this group is to help patients review their daily goal of treatment and discuss progress on daily workbooks.    Participation Level:  Did Not Attend  Participation Quality: Debe Coder 03/31/2022, 9:28 PM

## 2022-03-31 NOTE — Plan of Care (Signed)
  Problem: Education: Goal: Knowledge of disease or condition will improve Outcome: Not Progressing Goal: Understanding of discharge needs will improve Outcome: Not Progressing   Problem: Education: Goal: Ability to make informed decisions regarding treatment will improve Outcome: Not Progressing   Problem: Coping: Goal: Coping ability will improve Outcome: Not Progressing   Problem: Self-Concept: Goal: Will verbalize positive feelings about self Outcome: Not Progressing

## 2022-04-01 ENCOUNTER — Encounter (HOSPITAL_COMMUNITY): Payer: Self-pay | Admitting: Family

## 2022-04-01 ENCOUNTER — Other Ambulatory Visit: Payer: Self-pay

## 2022-04-01 ENCOUNTER — Inpatient Hospital Stay (HOSPITAL_COMMUNITY): Payer: Medicaid Other

## 2022-04-01 DIAGNOSIS — F3130 Bipolar disorder, current episode depressed, mild or moderate severity, unspecified: Secondary | ICD-10-CM | POA: Diagnosis not present

## 2022-04-01 DIAGNOSIS — M25562 Pain in left knee: Secondary | ICD-10-CM | POA: Diagnosis not present

## 2022-04-01 LAB — PROLACTIN: Prolactin: 4.8 ng/mL (ref 3.6–25.2)

## 2022-04-01 MED ORDER — LOPERAMIDE HCL 2 MG PO CAPS: 2.0000 mg | ORAL_CAPSULE | ORAL | Status: AC | PRN

## 2022-04-01 MED ORDER — VITAMIN B-1 100 MG PO TABS
100.0000 mg | ORAL_TABLET | Freq: Every day | ORAL | Status: DC
  Administered 2022-04-02 – 2022-04-04 (×3): 100 mg via ORAL
  Filled 2022-04-01 (×6): qty 1

## 2022-04-01 MED ORDER — HYDROXYZINE HCL 25 MG PO TABS: 25.0000 mg | ORAL_TABLET | Freq: Four times a day (QID) | ORAL | Status: AC | PRN

## 2022-04-01 MED ORDER — ADULT MULTIVITAMIN W/MINERALS CH
1.0000 | ORAL_TABLET | Freq: Every day | ORAL | Status: DC
  Administered 2022-04-01 – 2022-04-04 (×4): 1 via ORAL
  Filled 2022-04-01 (×7): qty 1

## 2022-04-01 MED ORDER — LORAZEPAM 1 MG PO TABS: 1.0000 mg | ORAL_TABLET | Freq: Four times a day (QID) | ORAL | Status: AC | PRN

## 2022-04-01 MED ORDER — ONDANSETRON 4 MG PO TBDP: 4.0000 mg | ORAL_TABLET | Freq: Four times a day (QID) | ORAL | Status: AC | PRN

## 2022-04-01 MED ORDER — QUETIAPINE FUMARATE 300 MG PO TABS
300.0000 mg | ORAL_TABLET | Freq: Every day | ORAL | Status: DC
  Administered 2022-04-01 – 2022-04-03 (×3): 300 mg via ORAL
  Filled 2022-04-01 (×7): qty 1

## 2022-04-01 MED ORDER — TRAZODONE HCL 50 MG PO TABS
50.0000 mg | ORAL_TABLET | Freq: Every evening | ORAL | Status: DC | PRN
  Administered 2022-04-01 – 2022-04-03 (×3): 50 mg via ORAL
  Filled 2022-04-01 (×3): qty 1

## 2022-04-01 NOTE — Progress Notes (Signed)
VASCULAR LAB    Left lower extremity venous duplex has been performed.  See CV proc for preliminary results.   Charmagne Buhl, RVT 04/01/2022, 4:27 PM

## 2022-04-01 NOTE — BHH Suicide Risk Assessment (Addendum)
[facility] Admission Suicide Risk Assessment  Nursing information obtained from:   Demographic factors: Male, Low socioeconomic status Current Mental Status: Suicidal ideation indicated by patient Loss Factors: Decrease in vocational status (death of a child) Historical Factors: Prior suicide attempts Risk Reduction Factors: Religious beliefs about death  Principal Problem: Bipolar I disorder, most recent episode depressed ([place]) Diagnosis: Principal Problem:   Bipolar I disorder, most recent episode depressed ([place]) Active Problems:   HIV disease ([place])   Elevated LFTs   Tobacco abuse   Generalized anxiety disorder   Alcohol abuse   Methamphetamine abuse ([place])   Cocaine abuse ([place])   S/P total knee arthroplasty, left   Subjective Data:  Dylan [name] is a 57 y.o. male, PMH of bipolar 1 d/o, PTSD, alcohol use d/o, stimulant use d/o (meth, cocaine), tobacco use d/o, suicide attempt x4, multiple inpatient psych admission (last time 8/22-25/2024 [facility]), who presented voluntary to [place] (03/30/2022) via law enforcement then transferred Voluntary to [place] (03/31/2022) after jumping in front of bus as suicide attempt (no contact with bus) after lapse on cocaine and EtOH in the setting of worsening depression from medication non-adherence x1 month   Continued Clinical Symptoms:  Alcohol Use Disorder Identification Test Final Score (AUDIT): 9 The "Alcohol Use Disorders Identification Test", Guidelines for Use in Primary Care, Second Edition.  World Pharmacologist [facility]). Score between 0-7:  no or low risk or alcohol related problems. Score between 8-15:  moderate risk of alcohol related problems. Score between 16-19:  high risk of alcohol related problems. Score 20 or above:  warrants further diagnostic evaluation for alcohol dependence and treatment.  CLINICAL FACTORS:  Severe Anxiety and/or Agitation Bipolar Disorder:   Depressive phase Dysthymia Alcohol/Substance  Abuse/Dependencies More than one psychiatric diagnosis Unstable or Poor Therapeutic Relationship Previous Psychiatric Diagnoses and Treatments Medical Diagnoses and Treatments/Surgeries  Musculoskeletal: Strength & Muscle Tone: within normal limits Gait & Station: shuffle-ambulates with rolling walker Patient leans: N/A  Presentation  General Appearance:Appropriate for Environment, Casual, Fairly Groomed Eye Contact:Good Speech:Clear and Coherent, Normal Rate Volume:Normal Handedness:Right  Mood and Affect  Mood:Depressed, Anxious, Hopeless, Worthless Affect:Appropriate, Congruent, Full Range  Thought Process  Thought Process:Coherent, Goal Directed, Linear Descriptions of Associations:Intact  Thought Content Suicidal Thoughts:Suicidal Thoughts: Yes, Active SI Active Intent and/or Plan: With Plan, Without Access to Means Homicidal Thoughts:Homicidal Thoughts: No Hallucinations:Hallucinations: None Ideas of Reference:None Thought Content:Rumination, Scattered, Tangential  Sensorium  Memory:Immediate Good, Recent Fair Judgment:Impaired Insight:Lacking  Executive Functions  Orientation:Full (Time, Place and Person) [place] of Knowledge:Good  Psychomotor Activity  Psychomotor Activity:Psychomotor Activity: Normal  Assets  Assets:Communication Skills, Desire for Improvement, Resilience, Housing, Physical Health, Talents/Skills, Vocational/Educational  Sleep  Quality:Good   COGNITIVE FEATURES THAT CONTRIBUTE TO RISK:  Closed-mindedness, Loss of executive function, Polarized thinking, and Thought constriction (tunnel vision)    SUICIDE RISK:  Severe: Frequent, intense, and enduring suicidal ideation, specific plan, no subjective intent, but some objective markers of intent (i.e., choice of lethal method), the method is accessible, some limited preparatory behavior, evidence of impaired self-control, severe  dysphoria/symptomatology, multiple risk factors present, and few if any protective factors, particularly a lack of social support.  PLAN OF CARE:  Patient met requirement for inpatient psychiatric hospitalization and has been admitted.  See H&P & attending's attestation for detailed plan.  I certify that inpatient services furnished can reasonably be expected to improve the patient's condition.  Total Time spent with patient:  See attending attestation  Signed: Merrily Brittle,  DO Psychiatry Resident, PGY-2 [facility] - Adult  [address] [place], [place] [address] Ph: [phone] Fax: [phone] 04/01/2022, 1:16 PM

## 2022-04-01 NOTE — ED Provider Notes (Signed)
[facility] Provider Note   CSN: [id number] Arrival date & time: 04/01/22  1457     History  Chief Complaint  Patient presents with   Knee Pain   Suicidal    Dylan [name] is a 57 y.o. male with past medical history hypertension, hyperlipidemia, prediabetes, schizoaffective disorder who presents to the ED from behavioral health for evaluation of left knee pain.  Patient currently admitted to behavioral health for suicidal ideation.  He states that he is status post left knee replacement in December 2023.  He reports that 2 days ago he had an episode of sharp pain to the posterior knee that caused it to collapse while he was bearing weight.  He has not had any episodes of pain since that time.  He has been able to ambulate without difficulty.  He states that he successfully completed physical therapy following his knee replacement and has not had any complications.  He states that the swelling to his left knee is actually decreased from his baseline and he is able to move the knee without difficulty.  He denies history of DVT/PE, recent travel, other recent surgery, hormone use, increased sedentary periods. He does not take anticoagulants.     Home Medications Prior to Admission medications   Medication Sig Start Date End Date Taking? Authorizing Provider  bictegravir-emtricitabine-tenofovir AF (BIKTARVY) 50-200-25 MG TABS tablet Take 1 tablet by mouth daily. 11/08/21   Carlyle Basques, MD  diclofenac Sodium (VOLTAREN) 1 % GEL Apply 2-4 g topically 3 (three) times daily as needed (right foot and ankle pain).    [provider]  gabapentin (NEURONTIN) 300 MG capsule Take 2 capsules (600 mg total) by mouth 3 (three) times daily. For pain 03/20/22   Charlott Rakes, MD  hydrOXYzine (ATARAX) 25 MG tablet Take 1 tablet (25 mg total) by mouth 3 (three) times daily as needed for anxiety. 09/02/21   Lindell Spar I, NP  ibuprofen (ADVIL) 800 MG  tablet Take 1 tablet (800 mg total) by mouth every 8 (eight) hours as needed. 03/17/22   Suzan Slick, NP  methocarbamol (ROBAXIN) 500 MG tablet Take 1 tablet (500 mg total) by mouth every 6 (six) hours as needed for muscle spasms. 03/17/22   Suzan Slick, NP  omeprazole (PRILOSEC) 40 MG capsule Take 1 capsule (40 mg total) by mouth daily. 01/04/22   Charlott Rakes, MD  QUEtiapine (SEROQUEL) 100 MG tablet Take 150 mg by mouth at bedtime.    [provider]  rosuvastatin (CRESTOR) 20 MG tablet Take 1 tablet (20 mg total) by mouth daily. 11/22/21 03/31/22  Janina Mayo, MD  traZODone (DESYREL) 150 MG tablet Take 1 tablet (150 mg total) by mouth at bedtime. For sleep Patient taking differently: Take 150 mg by mouth at bedtime as needed for sleep. 09/02/21   Lindell Spar I, NP      Allergies    Penicillins, Pork-derived products, Latex, and Tape    Review of Systems   Review of Systems  All other systems reviewed and are negative.   Physical Exam Updated Vital Signs BP 122/83 (BP Location: Right Arm)   Pulse 78   Temp 97.6 F (36.4 C) (Oral)   Resp 19   Ht 6' (1.829 m)   Wt 104.3 kg   SpO2 96%   BMI 31.19 kg/m  Physical Exam Vitals and nursing note reviewed.  Constitutional:      General: He is not in acute distress.  Appearance: Normal appearance. He is not ill-appearing or toxic-appearing.  HENT:     Head: Normocephalic and atraumatic.     Mouth/Throat:     Mouth: Mucous membranes are moist.  Eyes:     Conjunctiva/sclera: Conjunctivae normal.  Cardiovascular:     Rate and Rhythm: Normal rate and regular rhythm.     Heart sounds: No murmur heard. Pulmonary:     Effort: Pulmonary effort is normal.     Breath sounds: Normal breath sounds.  Abdominal:     General: Abdomen is flat.     Palpations: Abdomen is soft.     Tenderness: There is no abdominal tenderness.  Musculoskeletal:     Cervical back: Neck supple.     Comments: Mild nonpitting edema over L knee  diffusely, surgical scar from recent knee replacement is pink, appears to be well healing without tenderness, drainage, dehiscence, increased warmth, erythema, or other noted abnormalities, no tenderness to any aspect of L knee, L lower leg or calf, neurovascularly intact distally, extremity is warm, well perfused, and with soft compartments, range of motion, strength, and gait intact  Skin:    General: Skin is warm and dry.     Capillary Refill: Capillary refill takes less than 2 seconds.  Neurological:     Mental Status: He is alert. Mental status is at baseline.  Psychiatric:     Comments: Pleasant, cooperative male answering questions briskly and appropriately without signs he is responding to internal stimuli, slightly flat affect     ED Results / Procedures / Treatments   Labs (all labs ordered are listed, but only abnormal results are displayed) Labs Reviewed - No data to display  EKG None  Radiology DG Knee 1-2 Views Left  Result Date: 04/01/2022 CLINICAL DATA:  Acute left knee pain. EXAM: LEFT KNEE - 1-2 VIEW COMPARISON:  December 28, 2021. FINDINGS: The left femoral, tibial and patellar components are well situated. No fracture or dislocation is noted. IMPRESSION: No acute abnormality seen. Electronically Signed   By: Marijo Conception M.D.   On: 04/01/2022 15:40    Procedures Procedures    Medications Ordered in ED None  ED Course/ Medical Decision Making/ A&P                             Medical Decision Making  Medical Decision Making:   Dylan [name] is a 57 y.o. male who presented to the ED today with L knee pain detailed above.    Patient's presentation is complicated by their history of L knee replacement, current psychiatric hospitalization.  Complete initial physical exam performed, notably the patient  had mild nonpitting edema over L knee diffusely, surgical scar from recent knee replacement is pink, appears to be well healing without tenderness, drainage,  dehiscence, increased warmth, erythema, or other noted abnormalities, no tenderness to any aspect of L knee, L lower leg or calf, neurovascularly intact distally, extremity is warm, well perfused, and with soft compartments, range of motion, strength, and gait intact.    Reviewed and confirmed nursing documentation for past medical history, family history, social history.    Initial Assessment:   With the patient's presentation of L knee pain, most likely diagnosis is deconditioning. Differential diagnosis includes but is not limited to fracture, dislocation, septic joint, compartment syndrome, DVT, osteoarthritis, gout, popliteal cyst, bursitis, tendonitis This is most consistent with an acute complicated illness  Initial Plan:  L knee XR LLE vascular  US Objective evaluation as reviewed   Initial Study Results:   Radiology:  All images reviewed independently. Agree with radiology report at this time.   DG Knee 1-2 Views Left  Result Date: 04/01/2022 CLINICAL DATA:  Acute left knee pain. EXAM: LEFT KNEE - 1-2 VIEW COMPARISON:  December 28, 2021. FINDINGS: The left femoral, tibial and patellar components are well situated. No fracture or dislocation is noted. IMPRESSION: No acute abnormality seen. Electronically Signed   By: Marijo Conception M.D.   On: 04/01/2022 15:40     Final Assessment and Plan:   This is a 57 year old male status post left knee replacement December 2023 who presents to the ED for evaluation of left knee pain.  Patient currently admitted to psychiatric unit for suicidal ideation.  He reports that he had an episode of pain to the posterior knee 2 days ago where he felt the knee collapsed underneath him.  He has not had any further episodes of pain since that time.  He does have some edema to the knee but reports that this is actually improved from his baseline.  Exam as above is reassuring.  He has previous surgical scar from total knee replacement that is pink and appears to  be well-healing.  Mild edema overlying diffuse knee but no localizable point of tenderness, no significant overlying skin changes to suggest septic joint, range of motion and motor function intact.  Gait intact.  Reassuring vital signs. X-ray negative for acute findings.  Ultrasound negative for acute DVT.  No chest pain, shortness of breath, palpitations, syncope, normal vital signs so do not suspect PE.  With this, patient stable to be discharged from the ED back to psychiatric unit for further treatment of his suicidal ideation.   Clinical Impression:  1. Acute pain of left knee   2. History of left knee replacement   3. Suicidal ideation      Data Unavailable           Final Clinical Impression(s) / ED Diagnoses Final diagnoses:  Acute pain of left knee  History of left knee replacement  Suicidal ideation    Rx / DC Orders ED Discharge Orders     None         Suzzette Righter, PA-C 04/01/22 1636    Isla Pence, MD 04/01/22 2359

## 2022-04-01 NOTE — ED Triage Notes (Signed)
Pt transferred from behavioral health. Pt was at [facility] for SI. Pt c/o left knee pain. Pt got sent over here for eval on blood clots. Pt is axox4.

## 2022-04-01 NOTE — Progress Notes (Signed)
   04/01/22 2200  Psych Admission Type (Psych Patients Only)  Admission Status Voluntary  Psychosocial Assessment  Patient Complaints Depression  Eye Contact Brief  Facial Expression Flat  Affect Appropriate to circumstance;Depressed  Speech Logical/coherent  Interaction Minimal  Motor Activity Slow  Appearance/Hygiene Disheveled  Behavior Characteristics Cooperative  Mood Depressed;Pleasant  Thought Process  Coherency Circumstantial  Content Blaming others  Delusions None reported or observed  Perception WDL  Hallucination None reported or observed  Judgment Poor  Confusion None  Danger to Self  Current suicidal ideation? Denies

## 2022-04-01 NOTE — Progress Notes (Signed)
[facility] Note: Patient returned from [facility] after medication evaluation to rule out DVT.  Patient is alert and oriented x 4.  Presents with calm mood and affect.  Vital signs result within normal limit.  Denies suicidal thoughts, auditory and visual hallucinations.  Patient ambulatory on the unit with walker.  Patient is safe on the unit.

## 2022-04-01 NOTE — Progress Notes (Signed)
Pt resting in room. No s/s of withdrawals.

## 2022-04-01 NOTE — Hospital Course (Signed)
   Suicide attempt 11/26/2021, rec for old vinyard, but patient walked out (not IVC'd)  Past Medical History:  Dx: schizoaffective d/o v schizophrenia v bipolar 1 d/o, alcohol use d/o, cocanine use d/o, methamphetamine use d/o, PTSD Rx: *** Outpatient psych: [facility] Outpatient therapy: *** PCP: Charlott Rakes, MD  Suicide: multiple x4 Inpatient psych: multiple  Substance Use:  EtOH: yes Most recently had 5 month of sobriety prior to lapse (04/01/2022) Tobacco: yes Cannabis: *** IVDU: *** Opiates: *** BZOs: *** Others: *** Seizure: *** Detox/residential: ***  Social History: Living: *** Income: ***  Family History: denied Suicide: *** Bipolar d/o: *** SCZ/ScZA: *** Inpatient psych: *** Substance use: *** Dx: ***

## 2022-04-01 NOTE — H&P (Signed)
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 follow-up including medication management/psychotherapy  Treatment Plan Summary: I certify that inpatient services furnished can reasonably be expected to improve the patient's condition.   Daily contact with patient to assess and evaluate symptoms and progress in treatment and Medication management The risks/benefits/side-effects/alternatives to this medication were discussed in detail with the patient and time was given for questions. The patient consents to medication trial. The patient consents to medication trial. FDA black box warnings, if present, were  discussed. Metabolic profile and EKG monitoring obtained while on an atypical antipsychotic  Encouraged patient to participate in unit milieu and in scheduled group therapies  Short Term Goals: Ability to identify changes in lifestyle to reduce recurrence of condition will improve, Ability to verbalize feelings will improve, Ability to disclose and discuss suicidal ideas, Ability to demonstrate self-control will improve, Ability to identify and develop effective coping behaviors will improve, Ability to maintain clinical measurements within normal limits will improve, Compliance with prescribed medications will improve, and Ability to identify triggers associated with substance abuse/mental health issues will improve Long Term Goals: Improvement in symptoms so as ready for discharge  Total Time Spent in Direct Patient Care: See attending attestation. Patient's case was discussed with Attending Dr. Winfred Leeds  Signed: Merrily Brittle, DO Psychiatry Resident, PGY-2 [facility]  [address] [place], [place] [address] Ph: [phone] Fax: [phone] 04/01/2022, 1:38 PM

## 2022-04-01 NOTE — Group Note (Signed)
Date:  04/01/2022 Time:  10:21 PM  Group Topic/Focus:  Wrap-Up Group:   The focus of this group is to help patients review their daily goal of treatment and discuss progress on daily workbooks.    Participation Level:  Did Not Attend  Participation Leigh Aurora 04/01/2022, 10:21 PM

## 2022-04-01 NOTE — Progress Notes (Signed)
   03/31/22 2000  Psych Admission Type (Psych Patients Only)  Admission Status Voluntary  Psychosocial Assessment  Patient Complaints Anxiety;Depression  Eye Contact Brief  Facial Expression Anxious  Affect Anxious  Speech Logical/coherent  Interaction Demanding;Forwards little  Motor Activity Fidgety;Restless  Appearance/Hygiene Disheveled;Bizarre  Behavior Characteristics Cooperative  Mood Anxious;Labile  Aggressive Behavior  Targets Self  Thought Process  Coherency Circumstantial  Content Blaming others  Delusions None reported or observed  Perception Hallucinations  Hallucination Auditory  Judgment Impaired  Confusion None  Danger to Self  Current suicidal ideation? Denies (Denies)  Agreement Not to Harm Self Yes  Description of Agreement verbal  Danger to Others  Danger to Others None reported or observed   Patient c/o left knee pain, swelling  and knee locking prn and  Robaxin and Tylenol given Patient also provided with front wheel walker to use for ambulation as he is having unsteady gait. Fall protocol initiated. Patient compliant. Support and encouragement provided.

## 2022-04-02 DIAGNOSIS — F3130 Bipolar disorder, current episode depressed, mild or moderate severity, unspecified: Secondary | ICD-10-CM | POA: Diagnosis not present

## 2022-04-02 LAB — HEPATIC FUNCTION PANEL
ALT: 44 U/L (ref 0–44)
AST: 45 U/L — ABNORMAL HIGH (ref 15–41)
Albumin: 3.5 g/dL (ref 3.5–5.0)
Alkaline Phosphatase: 89 U/L (ref 38–126)
Bilirubin, Direct: <0.1 mg/dL (ref 0.0–0.2)
Total Bilirubin: 0.4 mg/dL (ref 0.3–1.2)
Total Protein: 6.5 g/dL (ref 6.5–8.1)

## 2022-04-02 LAB — LIPID PANEL
Cholesterol: 138 mg/dL (ref 0–200)
HDL: 35 mg/dL — ABNORMAL LOW (ref >40)
LDL Cholesterol: 51 mg/dL (ref 0–99)
Total CHOL/HDL Ratio: 3.9 RATIO
Triglycerides: 258 mg/dL — ABNORMAL HIGH (ref <150)
VLDL: 52 mg/dL — ABNORMAL HIGH (ref 0–40)

## 2022-04-02 MED ORDER — HYDROCORTISONE 0.5 % EX CREA
TOPICAL_CREAM | Freq: Three times a day (TID) | CUTANEOUS | Status: DC | PRN
  Administered 2022-04-02: 1 via TOPICAL
  Filled 2022-04-02: qty 28.35

## 2022-04-02 NOTE — [facility] Suicide Risk Assessment (Signed)
[place] INPATIENT:  Family/Significant Other Suicide Prevention Education  Suicide Prevention Education:  Patient Refusal for Family/Significant Other Suicide Prevention Education: The patient Dylan [name] has refused to provide written consent for family/significant other to be provided Family/Significant Other Suicide Prevention Education during admission and/or prior to discharge.  Physician notified.  Dylan [name] 04/02/2022, 11:34 AM

## 2022-04-02 NOTE — Progress Notes (Signed)
   04/02/22 0900  Psych Admission Type (Psych Patients Only)  Admission Status Voluntary  Psychosocial Assessment  Patient Complaints Depression  Eye Contact Brief  Facial Expression Flat  Affect Appropriate to circumstance;Depressed  Speech Logical/coherent  Interaction Minimal  Motor Activity Slow  Appearance/Hygiene Disheveled  Behavior Characteristics Cooperative  Mood Depressed  Thought Process  Coherency Circumstantial  Delusions None reported or observed  Perception WDL  Hallucination None reported or observed  Judgment Poor  Confusion None  Danger to Self  Current suicidal ideation? Denies

## 2022-04-02 NOTE — Group Note (Signed)
Date:  04/02/2022 Time:  10:28 PM  Group Topic/Focus:  Wrap-Up Group:   The focus of this group is to help patients review their daily goal of treatment and discuss progress on daily workbooks.    Participation Level:  Did Not Attend  Debe Coder 04/02/2022, 10:28 PM
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
## 2022-04-03 ENCOUNTER — Encounter (HOSPITAL_COMMUNITY): Payer: Self-pay

## 2022-04-03 DIAGNOSIS — F3130 Bipolar disorder, current episode depressed, mild or moderate severity, unspecified: Secondary | ICD-10-CM

## 2022-04-03 LAB — HEPATITIS PANEL, ACUTE
HCV Ab: NONREACTIVE
Hep A IgM: NONREACTIVE
Hep B C IgM: NONREACTIVE
Hepatitis B Surface Ag: REACTIVE — AB

## 2022-04-03 NOTE — Progress Notes (Signed)
   04/02/22 2200  Psych Admission Type (Psych Patients Only)  Admission Status Voluntary  Psychosocial Assessment  Patient Complaints None  Eye Contact Brief  Facial Expression Flat  Affect Appropriate to circumstance;Depressed  Speech Logical/coherent  Interaction Minimal  Motor Activity Slow  Appearance/Hygiene Disheveled  Behavior Characteristics Appropriate to situation  Mood Pleasant  Thought Process  Coherency Circumstantial  Content Blaming others  Delusions None reported or observed  Perception WDL  Hallucination None reported or observed  Judgment Poor  Confusion None  Danger to Self  Current suicidal ideation? Denies

## 2022-04-03 NOTE — [facility] Group Notes (Signed)
Pt attended [place] group. Pt was engaged and participated appropriately. Pt read excerpt and shared experience with group.

## 2022-04-03 NOTE — BH IP Treatment Plan (Signed)
Interdisciplinary Treatment and Diagnostic Plan Update  04/03/2022 Time of Session: 11am Dylan [name] MRN: [id number]  Principal Diagnosis: Bipolar I disorder, most recent episode depressed ([place])  Secondary Diagnoses: Principal Problem:   Bipolar I disorder, most recent episode depressed ([place]) Active Problems:   HIV disease ([place])   Elevated LFTs   Tobacco abuse   Generalized anxiety disorder   Alcohol abuse   Methamphetamine abuse ([place])   Cocaine abuse ([place])   S/P total knee arthroplasty, left   Current Medications:  Current Facility-Administered Medications  Medication Dose Route Frequency Provider Last Rate Last Admin   acetaminophen (TYLENOL) tablet 650 mg  650 mg Oral Q6H PRN Lucky Rathke, FNP   650 mg at 04/03/22 1203   alum & mag hydroxide-simeth (MAALOX/MYLANTA) 200-200-20 MG/5ML suspension 30 mL  30 mL Oral Q4H PRN Lucky Rathke, FNP       bictegravir-emtricitabine-tenofovir AF (BIKTARVY) 50-200-25 MG per tablet 1 tablet  1 tablet Oral Daily Lucky Rathke, FNP   1 tablet at 04/03/22 0900   diphenhydrAMINE (BENADRYL) capsule 50 mg  50 mg Oral TID PRN Lucky Rathke, FNP       Or   diphenhydrAMINE (BENADRYL) injection 50 mg  50 mg Intramuscular TID PRN Lucky Rathke, FNP       gabapentin (NEURONTIN) capsule 600 mg  600 mg Oral TID Lucky Rathke, FNP   600 mg at 04/03/22 1203   haloperidol (HALDOL) tablet 5 mg  5 mg Oral TID PRN Lucky Rathke, FNP       Or   haloperidol lactate (HALDOL) injection 5 mg  5 mg Intramuscular TID PRN Lucky Rathke, FNP       hydrocortisone cream 0.5 %   Topical TID PRN Christene [place], MD   1 Application at [id number]   hydrOXYzine (ATARAX) tablet 25 mg  25 mg Oral TID PRN Lucky Rathke, FNP       hydrOXYzine (ATARAX) tablet 25 mg  25 mg Oral Q6H PRN Merrily Brittle, DO       loperamide (IMODIUM) capsule 2-4 mg  2-4 mg Oral PRN Merrily Brittle, DO       LORazepam (ATIVAN) tablet 2 mg  2 mg Oral TID PRN Lucky Rathke, FNP       Or    LORazepam (ATIVAN) injection 2 mg  2 mg Intramuscular TID PRN Lucky Rathke, FNP       LORazepam (ATIVAN) tablet 1 mg  1 mg Oral Q6H PRN Merrily Brittle, DO       magnesium hydroxide (MILK OF MAGNESIA) suspension 30 mL  30 mL Oral Daily PRN Lucky Rathke, FNP       methocarbamol (ROBAXIN) tablet 500 mg  500 mg Oral Q6H PRN Lucky Rathke, FNP   500 mg at 04/02/22 2154   multivitamin with minerals tablet 1 tablet  1 tablet Oral Daily Merrily Brittle, DO   1 tablet at 04/03/22 0900   nicotine (NICODERM CQ - dosed in mg/24 hours) patch 14 mg  14 mg Transdermal Daily Lucky Rathke, FNP   14 mg at 04/03/22 0900   ondansetron (ZOFRAN-ODT) disintegrating tablet 4 mg  4 mg Oral Q6H PRN Merrily Brittle, DO       QUEtiapine (SEROQUEL) tablet 300 mg  300 mg Oral QHS Merrily Brittle, DO   300 mg at 04/02/22 2131   rosuvastatin (CRESTOR) tablet 20 mg  20 mg Oral Daily Lucky Rathke, FNP  20 mg at 04/03/22 0900   thiamine (Vitamin B-1) tablet 100 mg  100 mg Oral Daily Merrily Brittle, DO   100 mg at 04/03/22 0900   traZODone (DESYREL) tablet 50 mg  50 mg Oral QHS PRN Merrily Brittle, DO   50 mg at 04/02/22 2131   PTA Medications: Medications Prior to Admission  Medication Sig Dispense Refill Last Dose   bictegravir-emtricitabine-tenofovir AF (BIKTARVY) 50-200-25 MG TABS tablet Take 1 tablet by mouth daily. 30 tablet 11    diclofenac Sodium (VOLTAREN) 1 % GEL Apply 2-4 g topically 3 (three) times daily as needed (right foot and ankle pain).      gabapentin (NEURONTIN) 300 MG capsule Take 2 capsules (600 mg total) by mouth 3 (three) times daily. For pain 180 capsule 3    hydrOXYzine (ATARAX) 25 MG tablet Take 1 tablet (25 mg total) by mouth 3 (three) times daily as needed for anxiety. 75 tablet 0    ibuprofen (ADVIL) 800 MG tablet Take 1 tablet (800 mg total) by mouth every 8 (eight) hours as needed. 60 tablet 2    methocarbamol (ROBAXIN) 500 MG tablet Take 1 tablet (500 mg total) by mouth every 6 (six) hours as needed for  muscle spasms. 30 tablet 0    omeprazole (PRILOSEC) 40 MG capsule Take 1 capsule (40 mg total) by mouth daily. 30 capsule 3    QUEtiapine (SEROQUEL) 100 MG tablet Take 150 mg by mouth at bedtime.      rosuvastatin (CRESTOR) 20 MG tablet Take 1 tablet (20 mg total) by mouth daily. 90 tablet 3    traZODone (DESYREL) 150 MG tablet Take 1 tablet (150 mg total) by mouth at bedtime. For sleep (Patient taking differently: Take 150 mg by mouth at bedtime as needed for sleep.) 30 tablet 0     Patient Stressors:    Patient Strengths:    Treatment Modalities: Medication Management, Group therapy, Case management,  1 to 1 session with clinician, Psychoeducation, Recreational therapy.   Physician Treatment Plan for Primary Diagnosis: Bipolar I disorder, most recent episode depressed ([place]) Long Term Goal(s): Improvement in symptoms so as ready for discharge   Short Term Goals: Ability to identify changes in lifestyle to reduce recurrence of condition will improve Ability to verbalize feelings will improve  Medication Management: Evaluate patient's response, side effects, and tolerance of medication regimen.  Therapeutic Interventions: 1 to 1 sessions, Unit Group sessions and Medication administration.  Evaluation of Outcomes: Progressing  Physician Treatment Plan for Secondary Diagnosis: Principal Problem:   Bipolar I disorder, most recent episode depressed ([place]) Active Problems:   HIV disease ([place])   Elevated LFTs   Tobacco abuse   Generalized anxiety disorder   Alcohol abuse   Methamphetamine abuse ([place])   Cocaine abuse ([place])   S/P total knee arthroplasty, left  Long Term Goal(s): Improvement in symptoms so as ready for discharge   Short Term Goals: Ability to identify changes in lifestyle to reduce recurrence of condition will improve Ability to verbalize feelings will improve     Medication Management: Evaluate patient's response, side effects, and tolerance of medication  regimen.  Therapeutic Interventions: 1 to 1 sessions, Unit Group sessions and Medication administration.  Evaluation of Outcomes: Progressing   RN Treatment Plan for Primary Diagnosis: Bipolar I disorder, most recent episode depressed ([place]) Long Term Goal(s): Knowledge of disease and therapeutic regimen to maintain health will improve  Short Term Goals: Ability to remain free from injury will improve, Ability to verbalize  frustration and anger appropriately will improve, Ability to demonstrate self-control, Ability to participate in decision making will improve, Ability to verbalize feelings will improve, Ability to disclose and discuss suicidal ideas, Ability to identify and develop effective coping behaviors will improve, and Compliance with prescribed medications will improve  Medication Management: RN will administer medications as ordered by provider, will assess and evaluate patient's response and provide education to patient for prescribed medication. RN will report any adverse and/or side effects to prescribing provider.  Therapeutic Interventions: 1 on 1 counseling sessions, Psychoeducation, Medication administration, Evaluate responses to treatment, Monitor vital signs and CBGs as ordered, Perform/monitor CIWA, COWS, AIMS and Fall Risk screenings as ordered, Perform wound care treatments as ordered.  Evaluation of Outcomes: Progressing   LCSW Treatment Plan for Primary Diagnosis: Bipolar I disorder, most recent episode depressed ([place]) Long Term Goal(s): Safe transition to appropriate next level of care at discharge, Engage patient in therapeutic group addressing interpersonal concerns.  Short Term Goals: Engage patient in aftercare planning with referrals and resources, Increase social support, Increase ability to appropriately verbalize feelings, Increase emotional regulation, Facilitate acceptance of mental health diagnosis and concerns, Facilitate patient progression through stages  of change regarding substance use diagnoses and concerns, Identify triggers associated with mental health/substance abuse issues, and Increase skills for wellness and recovery  Therapeutic Interventions: Assess for all discharge needs, 1 to 1 time with Social worker, Explore available resources and support systems, Assess for adequacy in community support network, Educate family and significant other(s) on suicide prevention, Complete Psychosocial Assessment, Interpersonal group therapy.  Evaluation of Outcomes: Progressing   Progress in Treatment: Attending groups: Yes. Participating in groups: Yes. Taking medication as prescribed: Yes. Toleration medication: Yes. Family/Significant other contact made: No, will contact:  Patient declined consents Patient understands diagnosis: No. Discussing patient identified problems/goals with staff: Yes. Medical problems stabilized or resolved: Yes. Denies suicidal/homicidal ideation: Yes. Issues/concerns per patient self-inventory: No.   New problem(s) identified: No, Describe:  none reported   New Short Term/Long Term Goal(s): detox, medication management for mood stabilization; elimination of SI thoughts; development of comprehensive mental wellness/sobriety plan    Patient Goals:  Pt states, "I would like to get discharged and start attending more groups"  Discharge Plan or Barriers: dermatitisPatient recently admitted. CSW will continue to follow and assess for appropriate referrals and possible discharge planning.    Reason for Continuation of Hospitalization: Anxiety Depression Medication stabilization Withdrawal symptoms  Estimated Length of Stay: 1-3 days  Last 3 [place] Suicide Severity Risk Score: Flowsheet Row ED to Hosp-Admission (Current) from 03/31/2022 in [place] [facility] ED from 03/30/2022 in [facility] Admission (Discharged) from 12/14/2021 in [facility] Error: Q3, 4, or 5 should not be populated when Q2 is No High Risk Error: Q3, 4, or 5 should not be populated when Q2 is No       Last PHQ 2/9 Scores:    01/04/2022   10:14 AM 12/22/2021    4:01 PM 11/16/2021    2:18 PM  Depression screen PHQ 2/9  Decreased Interest 0 0 0  Down, Depressed, Hopeless 2 0 0  PHQ - 2 Score 2 0 0  Altered sleeping 3  1  Tired, decreased energy 1  1  Change in appetite 0  0  Feeling bad or failure about yourself  0  0  Trouble concentrating 0  0  Moving slowly or fidgety/restless  0  0  Suicidal thoughts 0  0  PHQ-9 Score 6  2  Difficult doing work/chores   Not difficult at all    Scribe for Treatment Team: Zachery Conch, LCSW 04/03/2022 12:50 PM

## 2022-04-03 NOTE — Progress Notes (Signed)
Assumed care of patient at 1100. Patient states he is '' ready to go home.  I have bills and I have a job I'm missing so I really need to go. I came because I wasn't sleeping and they didn't have my medication at the [facility] because the walk ins filled up. ''  Patient denies any SI HI or AV Hallucinations. Pt did express concerns for missing school stating he is '' in Health and safety inspector school as well ''  Pt states '' I just really need to go. I talked to the doctor and it doesn't sound like he will let me go today so I need to sign that form. '' Pt signed 72 hr request for discharge. Placed on front of chart and MD notified. Pt also requesting to speak with SW.

## 2022-04-03 NOTE — Progress Notes (Signed)
Pt medication compliant. Pt presents with irritable affect. Pt reports sleep difficulties due to noise on unit last night. Pt states he will not be attending groups. Pt refused to complete self inventory. Compliance with programming encouraged. Q 15 minute checks ongoing for safety.

## 2022-04-03 NOTE — Progress Notes (Signed)
Did not attend group 

## 2022-04-03 NOTE — Progress Notes (Signed)
   04/03/22 0541  15 Minute Checks  Location Bedroom  Visual Appearance Calm  Behavior Sleeping  Sleep (Behavioral Health Patients Only)  Calculate sleep? (Click Yes once per 24 hr at 0600 safety check) Yes  Documented sleep last 24 hours 11.25

## 2022-04-03 NOTE — Plan of Care (Signed)
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
## 2022-04-04 DIAGNOSIS — F3130 Bipolar disorder, current episode depressed, mild or moderate severity, unspecified: Secondary | ICD-10-CM | POA: Diagnosis not present

## 2022-04-04 LAB — HEMOGLOBIN A1C
Hgb A1c MFr Bld: 6.2 % — ABNORMAL HIGH (ref 4.8–5.6)
Mean Plasma Glucose: 131 mg/dL

## 2022-04-04 MED ORDER — GABAPENTIN 300 MG PO CAPS: 600.0000 mg | ORAL_CAPSULE | Freq: Three times a day (TID) | ORAL | 0 refills | Status: DC

## 2022-04-04 MED ORDER — TRAZODONE HCL 50 MG PO TABS: 50.0000 mg | ORAL_TABLET | Freq: Every evening | ORAL | 0 refills | Status: DC | PRN

## 2022-04-04 MED ORDER — NICOTINE 14 MG/24HR TD PT24: 14.0000 mg | MEDICATED_PATCH | Freq: Every day | TRANSDERMAL | 0 refills | Status: DC

## 2022-04-04 MED ORDER — QUETIAPINE FUMARATE 300 MG PO TABS: 300.0000 mg | ORAL_TABLET | Freq: Every day | ORAL | 0 refills | Status: DC

## 2022-04-04 NOTE — Progress Notes (Signed)
D: Writer observed patient in the dayroom acting appropriate with his peers and watch basketball on television. While walking over to the writer pt stated, "don't ask me no questions". Writer jokingly answered back informing the patient that she has to ask him a couple. When asked about his stay pt stated, "I'm sleeping better. It's the best sleep I've had in a couple of months". Informed the writer that his medications had expired and it was difficult getting an appointment "while on the streets". Pt signed a 72 hr request for discharge but stated "I understand if the Dr wants me to stay longer but I have a job to get to and rent to pay".   A:  Support and encouragement was offered. 15 min checks continued for safety.  R: Pt remains safe.

## 2022-04-04 NOTE — Discharge Instructions (Signed)
Activity: as tolerated  Diet: heart healthy  Other: -Follow-up with your outpatient psychiatric provider -instructions on appointment date, time, and address (location) are provided to you in discharge paperwork.  -Take your psychiatric medications as prescribed at discharge - instructions are provided to you in the discharge paperwork  -Follow-up with outpatient primary care doctor and other specialists -for management of preventative medicine and chronic medical disease, including: as listed.  -Testing: Follow-up with outpatient provider for abnormal lab results: NA  -Recommend abstinence from alcohol, tobacco, and other illicit drug use at discharge.   -If your psychiatric symptoms recur, worsen, or if you have side effects to your psychiatric medications, call your outpatient psychiatric provider, 911, [phone] or go to the nearest emergency department.  -If suicidal thoughts recur, call your outpatient psychiatric provider, 911, [phone] or go to the nearest emergency department.
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
## 2022-04-04 NOTE — BHH Suicide Risk Assessment (Signed)
Suicide Risk Assessment  Discharge Assessment    [facility] Discharge Suicide Risk Assessment   Principal Problem: Bipolar I disorder, most recent episode depressed ([place]) Discharge Diagnoses: Principal Problem:   Bipolar I disorder, most recent episode depressed ([place]) Active Problems:   HIV disease ([place])   Elevated LFTs   Tobacco abuse   Generalized anxiety disorder   Alcohol abuse   Methamphetamine abuse ([place])   Cocaine abuse ([place])   S/P total knee arthroplasty, left   Total Time spent with patient: 15 minutes  Dylan [name] is a 57 y.o., male with PMH of bipolar 1 d/o, PTSD, alcohol use d/o, stimulant use d/o (meth, cocaine), tobacco use d/o, suicide attempt x4, multiple inpatient psych admission (last time 8/22-25/2024 [facility]), who presented voluntary to [place] (03/30/2022) via law enforcement then transferred Voluntary to [place] (03/31/2022) after jumping in front of bus as suicide attempt (no contact with bus) after lapse on cocaine and EtOH in the setting of worsening depression from medication non-adherence x1 month   During the patient's hospitalization, patient had extensive initial psychiatric evaluation, and follow-up psychiatric evaluations every day.  Psychiatric diagnoses provided upon initial assessment:  Principal Problem:   Bipolar I disorder, most recent episode depressed ([place]) Active Problems:   HIV disease ([place])   Elevated LFTs   Tobacco abuse   Generalized anxiety disorder   Alcohol abuse   Methamphetamine abuse ([place])   Cocaine abuse ([place])   S/P total knee arthroplasty, left  Patient's psychiatric medications were adjusted on admission:  INCREASED home Seroquel 150 mg to 300 mg nightly CHANGED home trazodone 150 mg to 50 mg as needed  During the hospitalization, other adjustments were made to the patient's psychiatric medication regimen: N/A  Patient's care was discussed during the interdisciplinary team meeting every day during the hospitalization.  The  patient denies having side effects to prescribed psychiatric medication.  Gradually, patient started adjusting to milieu. The patient was evaluated each day by a clinical provider to ascertain response to treatment. Improvement was noted by the patient's report of decreasing symptoms, improved sleep and appetite, affect, medication tolerance, behavior, and participation in unit programming.  Patient was asked each day to complete a self inventory noting mood, mental status, pain, new symptoms, anxiety and concerns.    Symptoms were reported as significantly decreased or resolved completely by discharge.   On day of discharge, the patient reports that their mood is stable. The patient denied having suicidal thoughts for more than 48 hours prior to discharge.  Patient denies having homicidal thoughts.  Patient denies having auditory hallucinations.  Patient denies any visual hallucinations or other symptoms of psychosis. The patient was motivated to continue taking medication with a goal of continued improvement in mental health.   The patient reports their target psychiatric symptoms of suicidal ideation, depression, and auditory hallucinations all responded well to the psychiatric medications, and the patient reports overall benefit other psychiatric hospitalization. Supportive psychotherapy was provided to the patient. The patient also participated in regular group therapy while hospitalized. Coping skills, problem solving as well as relaxation therapies were also part of the unit programming.  Labs were reviewed with the patient, and abnormal results were discussed with the patient.  The patient is able to verbalize their individual safety plan to this provider.  # It is recommended to the patient to continue psychiatric medications as prescribed, after discharge from the hospital.    # It is recommended to the patient to follow up with your outpatient  psychiatric provider and PCP.  # It was  discussed with the patient, the impact of alcohol, drugs, tobacco have been there overall psychiatric and medical wellbeing, and total abstinence from substance use was recommended the patient.ed.  # Prescriptions provided or sent directly to preferred pharmacy at discharge. Patient agreeable to plan. Given opportunity to ask questions. Appears to feel comfortable with discharge.    # In the event of worsening symptoms, the patient is instructed to call the crisis hotline, 911 and or go to the nearest ED for appropriate evaluation and treatment of symptoms. To follow-up with primary care provider for other medical issues, concerns and or health care needs  # Patient was discharged Home with a plan to follow up as noted below.    Musculoskeletal: Strength & Muscle Tone: within normal limits Gait & Station: normal Patient leans: N/A  Psychiatric Specialty Exam:  Presentation  General Appearance:  Appropriate for Environment; Casual; Fairly Groomed  Eye Contact: Fair  Speech: Clear and Coherent; Normal Rate  Speech Volume: Normal  Handedness: Right   Mood and Affect  Mood: -- ("Doing really well")  Affect: Appropriate; Full Range; Congruent   Thought Process  Thought Processes: Coherent; Goal Directed; Linear  Descriptions of Associations:Intact  Orientation:Full (Time, Place and Person)  Thought Content:Logical; WDL  History of Schizophrenia/Schizoaffective disorder:Yes  Duration of Psychotic Symptoms:N/A  Hallucinations:Hallucinations: None  Ideas of Reference:None  Suicidal Thoughts:Suicidal Thoughts: No  Homicidal Thoughts:Homicidal Thoughts: No   Sensorium  Memory: Immediate Fair  Judgment: Fair  Insight: Fair   Executive Functions  Concentration: Good  Attention Span: Good  Recall: Good  Fund of Knowledge: Good  Language: Good   Psychomotor Activity  Psychomotor Activity: Psychomotor Activity: Normal   Assets   Assets: Communication Skills; Desire for Improvement; Resilience   Sleep  Sleep: Sleep: Good    Physical Exam: Physical Exam Vitals and nursing note reviewed.  Constitutional:      General: He is not in acute distress.    Appearance: Normal appearance. He is not ill-appearing.  HENT:     Head: Normocephalic and atraumatic.  Pulmonary:     Effort: Pulmonary effort is normal. No respiratory distress.  Musculoskeletal:        General: Normal range of motion.  Skin:    General: Skin is warm and dry.  Neurological:     General: No focal deficit present.     Mental Status: He is alert.    Review of Systems  Respiratory:  Negative for shortness of breath.   Cardiovascular:  Negative for chest pain.  Gastrointestinal:  Negative for abdominal pain, constipation and diarrhea.  Psychiatric/Behavioral:  Negative for depression, hallucinations, memory loss, substance abuse and suicidal ideas. The patient is not nervous/anxious and does not have insomnia.    Blood pressure 114/74, pulse 88, temperature 97.9 F (36.6 C), temperature source Oral, resp. rate (!) 21, height 6' (1.829 m), weight 104.3 kg, SpO2 96 %. Body mass index is 31.19 kg/m.  Mental Status Per Nursing Assessment::   On Admission:  Suicidal ideation indicated by patient  Demographic factors: Male, Low socioeconomic status Current Mental Status: Suicidal ideation indicated by patient Loss Factors: Decrease in vocational status (death of a child) Historical Factors: Prior suicide attempts Risk Reduction Factors: Religious beliefs about death   Continued Clinical Symptoms:  Previous Psychiatric Diagnoses and Treatments  Cognitive Features That Contribute To Risk:  None    Suicide Risk:  Mild: There are no identifiable suicide plans, no associated intent,  mild dysphoria and related symptoms, good self-control (both objective and subjective assessment), few other risk factors, and identifiable protective  factors, including available and accessible social support.    [place] Follow up on 04/12/2022.   Specialty: Behavioral Health Why: You have an appointment for medication management services on  04/12/22 at 9:00 am (this will be a Virtual appointment).  You also have an appointment for therapy services on 05/05/22 at 9:00 am (this will also be a Virtual appt). Contact information: [place] [phone]                Plan Of Care/Follow-up recommendations:  Activity: as tolerated  Diet: heart healthy  Other: -Follow-up with your outpatient psychiatric provider -instructions on appointment date, time, and address (location) are provided to you in discharge paperwork.  -Take your psychiatric medications as prescribed at discharge - instructions are provided to you in the discharge paperwork  -Follow-up with outpatient primary care doctor and other specialists -for management of preventative medicine and chronic medical disease, including:   Left knee pain  Total knee arthroplasty Started having acute posterior knee pain at rest of unclear etiology.  Pain exacerbated with movement.  No leg swelling, just localized to knee. Improved, stable on day of discharge Was transferred to ED and ruled out DVT Imaging was unremarkable   Elevated transaminase Suspected 2/2 EtOH use, has a history of fatty liver. Hep B Surface Antigen REACTIVE 04/02/2022   HIV Continued home biktarvy 1 tablet daily   HLD Continued home crestor 20 mg daily   -Testing: Follow-up with outpatient provider for abnormal lab results: see above  -Recommend abstinence from alcohol, tobacco, and other illicit drug use at discharge.   -If your psychiatric symptoms recur, worsen, or if you have side effects to your psychiatric medications, call your outpatient psychiatric provider, 911, [phone] or go to the nearest emergency  department.  -If suicidal thoughts recur, call your outpatient psychiatric provider, 911, [phone] or go to the nearest emergency department.     Christene Slates, MD 04/04/2022, 1:05 PM

## 2022-04-04 NOTE — [facility] Group Notes (Signed)
Spiritual care group on grief and loss facilitated by Chaplain Katy Lajuan Kovaleski, [facility] and Colleen Pesci, counseling intern.  Group Goal: Support / Education around grief and loss  Members engage in facilitated group support and psycho-social education.  Group Description:  Following introductions and group rules, group members engaged in facilitated group dialogue and support around topic of loss, with particular support around experiences of loss in their lives. Group Identified types of loss (relationships / self / things) and identified patterns, circumstances, and changes that precipitate losses. Reflected on thoughts / feelings around loss, normalized grief responses, and recognized variety in grief experience. Group encouraged individual reflection on safe space and on the coping skills that they are already utilizing.  Group drew on Adlerian / Rogerian and narrative framework  Patient Progress: Did not attend.  

## 2022-04-04 NOTE — Group Note (Signed)
Date:  04/04/2022 Time:  1:58 PM  Group Topic/Focus:  Peer Support    Participation Level:  Active  Participation Quality:  Appropriate  Affect:  Appropriate  Cognitive:  Appropriate  Insight: Appropriate  Engagement in Group:  Engaged  Modes of Intervention:  Education and Support  Additional Comments:   Pt attended and participated in the [facility] group.  Wetzel Bjornstad Annissa Andreoni 04/04/2022, 1:58 PM

## 2022-04-04 NOTE — Progress Notes (Addendum)
Patient is discharging at this time. Patient is A&Ox4. Patient stable. Patient denies SI,HI, and A/V/H with no plan/intent. Printed AVS reviewed with and given to patient along with medications and follow up appointments. Suicide safety plan sheet complete with copy provided to patient. Patient verbalized all understanding. All valuables/belongings returned to patient. Patient is being transported by taxi. Patient denies any pain/discomfort. No s/s of current distress.

## 2022-04-04 NOTE — Progress Notes (Signed)
  [facility] Adult Case Management Discharge Plan :  Will you be returning to the same living situation after discharge:  Yes,  home At discharge, do you have transportation home?: Yes,  will get a taxi through hospital Do you have the ability to pay for your medications: Yes,  insurance   Release of information consent forms completed and in the chart;  Patient's signature needed at discharge.  Patient to Follow up at:  [facility] Follow up on 04/12/2022.   Specialty: Behavioral Health Why: You have an appointment for medication management services on  04/12/22 at 9:00 am (this will be a Virtual appointment).  You also have an appointment for therapy services on 05/05/22 at 9:00 am (this will also be a Virtual appt). Contact information: [place] [phone]                Next level of care provider has access to [place] and Suicide Prevention discussed: Yes,  with patient, patient declined consents      Has patient been referred to the Quitline?: Patient refused referral  Patient has been referred for addiction treatment: Pt. refused referral  [place], LCSW 04/04/2022, 1:33 PM

## 2022-04-04 NOTE — Group Note (Signed)
Recreation Therapy Group Note   Group Topic:Animal Assisted Therapy   Group Date: 04/04/2022 Start Time: [id number] End Time: 1030 Facilitators: Spencer Cardinal-McCall, LRT,CTRS Location: [address] Dayroom   Animal-Assisted Activity (AAA) Program Checklist/Progress Notes Patient Eligibility Criteria Checklist & Daily Group note for Rec Tx Intervention  AAA/T Program Assumption of Risk Form signed by Patient/ or Parent Legal Guardian Yes  Patient is free of allergies or severe asthma Yes  Patient reports no fear of animals Yes  Patient reports no history of cruelty to animals Yes  Patient understands his/her participation is voluntary Yes  Patient washes hands before animal contact Yes  Patient washes hands after animal contact Yes   Affect/Mood: Appropriate   Participation Level: Moderate   Participation Quality: Independent   Behavior: Attentive    Speech/Thought Process: Distracted    Clinical Observations/Individualized Feedback: Pt was appropriate and attentive during group.  Pt had minimal interaction with therapy dog.  Pt did leave group but later returned.  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    Plan: Continue to engage patient in RT group sessions 2-3x/week.   Justise Ehmann-McCall, LRT,CTRS 04/04/2022 1:59 PM

## 2022-04-04 NOTE — Group Note (Signed)
Date:  04/04/2022 Time:  10:23 AM  Group Topic/Focus:  Goals Group:   The focus of this group is to help patients establish daily goals to achieve during treatment and discuss how the patient can incorporate goal setting into their daily lives to aide in recovery. Orientation:   The focus of this group is to educate the patient on the purpose and policies of crisis stabilization and provide a format to answer questions about their admission.  The group details unit policies and expectations of patients while admitted.    Participation Level:  Active  Participation Quality:  Appropriate  Affect:  Appropriate  Cognitive:  Appropriate  Insight: Appropriate  Engagement in Group:  Engaged  Modes of Intervention:  Discussion and Education  Additional Comments:   Pt attended and actively participated in the Orientation/ Goals group. Pt personal goal is to resume attending school and work. Pt expressed concern about losing his job and falling behind with school because of his inpatient treatment.   Wetzel Bjornstad Salbador Fiveash 04/04/2022, 10:23 AM
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
## 2022-04-06 NOTE — Patient Instructions (Signed)
Thank you for attending your appointment today.  -- Continue Seroquel and trazodone as prescribed. -- START Atarax 25 mg twice daily as needed for anxiety -- Continue other medications as prescribed.  Please do not make any changes to medications without first discussing with your provider. If you are experiencing a psychiatric emergency, please call 911 or present to your nearest emergency department. Additional crisis, medication management, and therapy resources are included below.  [facility]  [address] [place], [place] [address] [phone] WALK-IN URGENT CARE 24/7 FOR ANYONE [address] [place], [place] Fax: [phone] [email or web address] *Interpreters available *Accepts all insurance and uninsured for Urgent Care needs *Accepts Medicaid and uninsured for outpatient treatment (below)      ONLY FOR [facility]  Below:    Outpatient New Patient Assessment/Therapy Walk-ins:        Monday -Thursday 8am until slots are full.        Every Friday 1pm-4pm  (first come, first served)                   New Patient Psychiatry/Medication Management        Monday-Friday 8am-11am (first come, first served)               For all walk-ins we ask that you arrive by 7:15am, because patients will be seen in the order of arrival.

## 2022-04-12 ENCOUNTER — Encounter (HOSPITAL_COMMUNITY): Payer: Self-pay | Admitting: Psychiatry

## 2022-04-12 ENCOUNTER — Ambulatory Visit (INDEPENDENT_AMBULATORY_CARE_PROVIDER_SITE_OTHER): Payer: Medicaid Other | Admitting: Psychiatry

## 2022-04-12 DIAGNOSIS — F1994 Other psychoactive substance use, unspecified with psychoactive substance-induced mood disorder: Secondary | ICD-10-CM

## 2022-04-12 DIAGNOSIS — F1410 Cocaine abuse, uncomplicated: Secondary | ICD-10-CM | POA: Diagnosis not present

## 2022-04-12 DIAGNOSIS — F1090 Alcohol use, unspecified, uncomplicated: Secondary | ICD-10-CM | POA: Diagnosis not present

## 2022-04-12 DIAGNOSIS — Z720 Tobacco use: Secondary | ICD-10-CM

## 2022-04-12 DIAGNOSIS — F4310 Post-traumatic stress disorder, unspecified: Secondary | ICD-10-CM

## 2022-04-12 MED ORDER — TRAZODONE HCL 50 MG PO TABS: 50.0000 mg | ORAL_TABLET | Freq: Every evening | ORAL | 2 refills | Status: DC | PRN

## 2022-04-12 MED ORDER — NICOTINE 21 MG/24HR TD PT24: 21.0000 mg | MEDICATED_PATCH | Freq: Every day | TRANSDERMAL | 2 refills | Status: DC

## 2022-04-12 MED ORDER — QUETIAPINE FUMARATE 300 MG PO TABS: 300.0000 mg | ORAL_TABLET | Freq: Every day | ORAL | 2 refills | Status: DC

## 2022-04-12 MED ORDER — HYDROXYZINE HCL 25 MG PO TABS: 25.0000 mg | ORAL_TABLET | Freq: Two times a day (BID) | ORAL | 2 refills | Status: DC | PRN

## 2022-04-13 ENCOUNTER — Telehealth (HOSPITAL_COMMUNITY): Payer: Self-pay | Admitting: Licensed Clinical Social Worker

## 2022-04-13 NOTE — Telephone Encounter (Signed)
The therapist calls [name] informing him that he is calling at the request of Dylan [name]. Dylan [name] says that he is about to get on the bus so cannot talk privately asking that the therapist email his contact number to [email or web address].   Dylan [name], [place], LCSW, [facility] [place] 04/13/2022

## 2022-04-13 NOTE — Telephone Encounter (Signed)
The therapist sends an email to [email or web address] with the subject line, "Contact information" and the following:  "Good afternoon,  I am reaching out to you at the request of Ms. Sarah B___. My direct contact number is [phone].  Regards,"    Adam Phenix, [place], LCSW, [facility], [place] 04/13/2022

## 2022-05-05 ENCOUNTER — Ambulatory Visit (HOSPITAL_COMMUNITY): Payer: Medicaid Other | Admitting: Clinical

## 2022-05-05 ENCOUNTER — Ambulatory Visit: Payer: Medicaid Other | Admitting: Podiatry
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
## 2022-05-10 ENCOUNTER — Encounter (HOSPITAL_COMMUNITY): Payer: Self-pay

## 2022-05-10 ENCOUNTER — Telehealth (HOSPITAL_COMMUNITY): Payer: Medicaid Other | Admitting: Psychiatry

## 2022-05-12 ENCOUNTER — Encounter (HOSPITAL_COMMUNITY): Payer: Self-pay

## 2022-05-12 ENCOUNTER — Telehealth (HOSPITAL_COMMUNITY): Payer: Self-pay | Admitting: Clinical

## 2022-05-12 ENCOUNTER — Ambulatory Visit (HOSPITAL_COMMUNITY): Payer: Medicaid Other | Admitting: Clinical

## 2022-05-12 NOTE — Telephone Encounter (Signed)
Therapist attempted to contact the client for the scheduled virtual therapy visit. Therapist was unable to reach the client via phone as well.
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
## 2022-05-15 ENCOUNTER — Telehealth (INDEPENDENT_AMBULATORY_CARE_PROVIDER_SITE_OTHER): Payer: Medicaid Other | Admitting: Psychiatry

## 2022-05-15 ENCOUNTER — Encounter (HOSPITAL_COMMUNITY): Payer: Self-pay | Admitting: Psychiatry

## 2022-05-15 DIAGNOSIS — F3130 Bipolar disorder, current episode depressed, mild or moderate severity, unspecified: Secondary | ICD-10-CM

## 2022-05-15 DIAGNOSIS — F4310 Post-traumatic stress disorder, unspecified: Secondary | ICD-10-CM | POA: Diagnosis not present

## 2022-05-15 MED ORDER — QUETIAPINE FUMARATE 300 MG PO TABS: 300.0000 mg | ORAL_TABLET | Freq: Every day | ORAL | 2 refills | Status: DC

## 2022-05-15 MED ORDER — HYDROXYZINE HCL 25 MG PO TABS: 25.0000 mg | ORAL_TABLET | Freq: Every day | ORAL | 2 refills | Status: AC | PRN

## 2022-05-15 MED ORDER — TRAZODONE HCL 50 MG PO TABS: 50.0000 mg | ORAL_TABLET | Freq: Every evening | ORAL | 2 refills | Status: DC | PRN

## 2022-05-15 NOTE — Patient Instructions (Signed)
Thank you for attending your appointment today.  -- We did not make any medication changes today. Please continue medications as prescribed.  Please do not make any changes to medications without first discussing with your provider. If you are experiencing a psychiatric emergency, please call 911 or present to your nearest emergency department. Additional crisis, medication management, and therapy resources are included below.  [facility]  [address], [place], [place] [address] [phone] WALK-IN URGENT CARE 24/7 FOR ANYONE [address], [place], [place]  [phone] Fax: [phone] [email or web address] *Interpreters available *Accepts all insurance and uninsured for Urgent Care needs *Accepts Medicaid and uninsured for outpatient treatment (below)      ONLY FOR [place] Residents  Below:    Outpatient New Patient Assessment/Therapy Walk-ins:        Monday -Thursday 8am until slots are full.        Every Friday 1pm-4pm  (first come, first served)                   New Patient Psychiatry/Medication Management        Monday-Friday 8am-11am (first come, first served)               For all walk-ins we ask that you arrive by 7:15am, because patients will be seen in the order of arrival.   

## 2022-06-02 ENCOUNTER — Observation Stay (HOSPITAL_COMMUNITY)
Admission: EM | Admit: 2022-06-02 | Discharge: 2022-06-03 | Disposition: A | Discharge status: Home / Self Care | Payer: Medicaid Other | Attending: Internal Medicine | Admitting: Internal Medicine

## 2022-06-02 ENCOUNTER — Observation Stay (HOSPITAL_BASED_OUTPATIENT_CLINIC_OR_DEPARTMENT_OTHER): Payer: Medicaid Other

## 2022-06-02 ENCOUNTER — Encounter (HOSPITAL_COMMUNITY): Payer: Self-pay

## 2022-06-02 ENCOUNTER — Emergency Department (HOSPITAL_COMMUNITY): Payer: Medicaid Other

## 2022-06-02 ENCOUNTER — Other Ambulatory Visit: Payer: Self-pay

## 2022-06-02 DIAGNOSIS — R079 Chest pain, unspecified: Principal | ICD-10-CM | POA: Diagnosis present

## 2022-06-02 DIAGNOSIS — Z6839 Body mass index (BMI) 39.0-39.9, adult: Secondary | ICD-10-CM | POA: Insufficient documentation

## 2022-06-02 DIAGNOSIS — Z87891 Personal history of nicotine dependence: Secondary | ICD-10-CM | POA: Insufficient documentation

## 2022-06-02 DIAGNOSIS — D72829 Elevated white blood cell count, unspecified: Secondary | ICD-10-CM | POA: Diagnosis present

## 2022-06-02 DIAGNOSIS — Z79899 Other long term (current) drug therapy: Secondary | ICD-10-CM | POA: Diagnosis not present

## 2022-06-02 DIAGNOSIS — Z91040 Latex allergy status: Secondary | ICD-10-CM | POA: Insufficient documentation

## 2022-06-02 DIAGNOSIS — B20 Human immunodeficiency virus [HIV] disease: Secondary | ICD-10-CM | POA: Diagnosis not present

## 2022-06-02 DIAGNOSIS — E785 Hyperlipidemia, unspecified: Secondary | ICD-10-CM | POA: Diagnosis present

## 2022-06-02 DIAGNOSIS — F1910 Other psychoactive substance abuse, uncomplicated: Secondary | ICD-10-CM | POA: Diagnosis present

## 2022-06-02 DIAGNOSIS — I214 Non-ST elevation (NSTEMI) myocardial infarction: Secondary | ICD-10-CM

## 2022-06-02 DIAGNOSIS — I5030 Unspecified diastolic (congestive) heart failure: Secondary | ICD-10-CM | POA: Diagnosis not present

## 2022-06-02 DIAGNOSIS — N179 Acute kidney failure, unspecified: Secondary | ICD-10-CM | POA: Insufficient documentation

## 2022-06-02 DIAGNOSIS — R7303 Prediabetes: Secondary | ICD-10-CM | POA: Diagnosis present

## 2022-06-02 DIAGNOSIS — E669 Obesity, unspecified: Secondary | ICD-10-CM | POA: Diagnosis present

## 2022-06-02 DIAGNOSIS — Z21 Asymptomatic human immunodeficiency virus [HIV] infection status: Secondary | ICD-10-CM | POA: Diagnosis present

## 2022-06-02 DIAGNOSIS — Z96652 Presence of left artificial knee joint: Secondary | ICD-10-CM | POA: Insufficient documentation

## 2022-06-02 LAB — BASIC METABOLIC PANEL
Anion gap: 14 (ref 5–15)
BUN: 30 mg/dL — ABNORMAL HIGH (ref 6–20)
CO2: 20 mmol/L — ABNORMAL LOW (ref 22–32)
Calcium: 10.2 mg/dL (ref 8.9–10.3)
Chloride: 101 mmol/L (ref 98–111)
Creatinine, Ser: 2.4 mg/dL — ABNORMAL HIGH (ref 0.61–1.24)
GFR, Estimated: 31 mL/min — ABNORMAL LOW (ref >60)
Glucose, Bld: 110 mg/dL — ABNORMAL HIGH (ref 70–99)
Potassium: 5.1 mmol/L (ref 3.5–5.1)
Sodium: 135 mmol/L (ref 135–145)

## 2022-06-02 LAB — CBC WITH DIFFERENTIAL/PLATELET
Abs Immature Granulocytes: 0.02 10*3/uL (ref 0.00–0.07)
Basophils Absolute: 0 10*3/uL (ref 0.0–0.1)
Basophils Relative: 0 %
Eosinophils Absolute: 0 10*3/uL (ref 0.0–0.5)
Eosinophils Relative: 0 %
HCT: 45.7 % (ref 39.0–52.0)
Hemoglobin: 15.2 g/dL (ref 13.0–17.0)
Immature Granulocytes: 0 %
Lymphocytes Relative: 12 %
Lymphs Abs: 1.4 10*3/uL (ref 0.7–4.0)
MCH: 30 pg (ref 26.0–34.0)
MCHC: 33.3 g/dL (ref 30.0–36.0)
MCV: 90.1 fL (ref 80.0–100.0)
Monocytes Absolute: 0.8 10*3/uL (ref 0.1–1.0)
Monocytes Relative: 7 %
Neutro Abs: 9.2 10*3/uL — ABNORMAL HIGH (ref 1.7–7.7)
Neutrophils Relative %: 81 %
Platelets: 212 10*3/uL (ref 150–400)
RBC: 5.07 MIL/uL (ref 4.22–5.81)
RDW: 14.4 % (ref 11.5–15.5)
WBC: 11.4 10*3/uL — ABNORMAL HIGH (ref 4.0–10.5)
nRBC: 0 % (ref 0.0–0.2)

## 2022-06-02 LAB — TROPONIN I (HIGH SENSITIVITY)
Troponin I (High Sensitivity): 80 ng/L — ABNORMAL HIGH (ref <18)
Troponin I (High Sensitivity): 102 ng/L (ref <18)

## 2022-06-02 LAB — ECHOCARDIOGRAM COMPLETE
Area-P 1/2: 3.5 cm2
Calc EF: 63.4 %
Height: 74 in
S' Lateral: 2.8 cm
Single Plane A2C EF: 67.3 %
Single Plane A4C EF: 60.9 %
Weight: 3640 oz

## 2022-06-02 LAB — HEPARIN LEVEL (UNFRACTIONATED): Heparin Unfractionated: 0.24 IU/mL — ABNORMAL LOW (ref 0.30–0.70)

## 2022-06-02 LAB — CK: Total CK: 3240 U/L — ABNORMAL HIGH (ref 49–397)

## 2022-06-02 MED ORDER — ASPIRIN 81 MG PO TBEC
81.0000 mg | DELAYED_RELEASE_TABLET | Freq: Every day | ORAL | Status: DC
  Administered 2022-06-03: 81 mg via ORAL
  Filled 2022-06-02: qty 1

## 2022-06-02 MED ORDER — ALBUTEROL SULFATE (2.5 MG/3ML) 0.083% IN NEBU: 2.5000 mg | INHALATION_SOLUTION | Freq: Four times a day (QID) | RESPIRATORY_TRACT | Status: DC | PRN

## 2022-06-02 MED ORDER — ONDANSETRON HCL 4 MG PO TABS: 4.0000 mg | ORAL_TABLET | Freq: Four times a day (QID) | ORAL | Status: DC | PRN

## 2022-06-02 MED ORDER — ROSUVASTATIN CALCIUM 20 MG PO TABS
20.0000 mg | ORAL_TABLET | Freq: Every day | ORAL | Status: DC
  Administered 2022-06-02 – 2022-06-03 (×2): 20 mg via ORAL
  Filled 2022-06-02 (×2): qty 1

## 2022-06-02 MED ORDER — ONDANSETRON HCL 4 MG/2ML IJ SOLN: 4.0000 mg | Freq: Four times a day (QID) | INTRAMUSCULAR | Status: DC | PRN

## 2022-06-02 MED ORDER — ACETAMINOPHEN 650 MG RE SUPP: 650.0000 mg | Freq: Four times a day (QID) | RECTAL | Status: DC | PRN

## 2022-06-02 MED ORDER — ACETAMINOPHEN 325 MG PO TABS: 650.0000 mg | ORAL_TABLET | Freq: Four times a day (QID) | ORAL | Status: DC | PRN

## 2022-06-02 MED ORDER — SODIUM CHLORIDE 0.9% FLUSH
3.0000 mL | Freq: Two times a day (BID) | INTRAVENOUS | Status: DC
  Administered 2022-06-02 – 2022-06-03 (×2): 3 mL via INTRAVENOUS

## 2022-06-02 MED ORDER — MORPHINE SULFATE (PF) 4 MG/ML IV SOLN
4.0000 mg | Freq: Once | INTRAVENOUS | Status: AC
  Administered 2022-06-02: 4 mg via INTRAVENOUS
  Filled 2022-06-02: qty 1

## 2022-06-02 MED ORDER — SODIUM CHLORIDE 0.9 % IV SOLN: INTRAVENOUS | Status: DC

## 2022-06-02 MED ORDER — BICTEGRAVIR-EMTRICITAB-TENOFOV 50-200-25 MG PO TABS
1.0000 | ORAL_TABLET | Freq: Every day | ORAL | Status: DC
  Administered 2022-06-02 – 2022-06-03 (×2): 1 via ORAL
  Filled 2022-06-02 (×2): qty 1

## 2022-06-02 MED ORDER — ONDANSETRON HCL 4 MG/2ML IJ SOLN
4.0000 mg | Freq: Once | INTRAMUSCULAR | Status: AC
  Administered 2022-06-02: 4 mg via INTRAVENOUS
  Filled 2022-06-02: qty 2

## 2022-06-02 MED ORDER — HEPARIN (PORCINE) 25000 UT/250ML-% IV SOLN
1600.0000 [IU]/h | INTRAVENOUS | Status: DC
  Administered 2022-06-02: 1300 [IU]/h via INTRAVENOUS
  Administered 2022-06-03: 1450 [IU]/h via INTRAVENOUS
  Filled 2022-06-02 (×2): qty 250

## 2022-06-02 MED ORDER — HEPARIN BOLUS VIA INFUSION
4000.0000 [IU] | Freq: Once | INTRAVENOUS | Status: AC
  Administered 2022-06-02: 4000 [IU] via INTRAVENOUS
  Filled 2022-06-02: qty 4000

## 2022-06-02 NOTE — Progress Notes (Signed)
Echocardiogram 2D Echocardiogram has been performed.  Toni Amend 06/02/2022, 10:28 AM

## 2022-06-02 NOTE — Consult Note (Addendum)
CARDIOLOGY CONSULT NOTE  Patient ID: Dylan [name] MRN: [id number] DOB/AGE: 1965-05-13 57 y.o.  Admit date: 06/02/2022 Referring Physician  Dr Katrinka Blazing Primary Physician:  Hoy Register, MD Reason for Consultation  chest pain  Patient ID: Dylan [name], male    DOB: 08-09-1965, 57 y.o.   MRN: [id number]  Chief Complaint  Patient presents with   Chest Pain   HPI:    Dylan [name]  is a 57 y.o. male with past medical history significant for HIV, Bipolar disorder, and polysubstance abuse who presented to the hospital with chest pain. He endorses doing cocaine about 5 days ago. He also has history of EtOH dependence and drug induced mood disorder. Patient does not have any cardiac history. Today he experienced chest pain associated with diaphoresis and light-headedness. Upon further review, patient feels light-headed and diaphoretic with positional changes which sounds like orthostatic hypotension due to dehydration. He states he has been drinking enough water "sometimes" and when he bends over and stands back up he feels light-headed and sweaty. He does go to the gym multiple times a week and lifts weights and he never has chest pain, diaphoresis, or feelings of light-headedness. He does have a family history of CAD but he himself denies CAD history in himself. He feels comfortable going home and doing a stress test in the outpatient setting. He does not have any chest pain, shortness of breath, syncope, or diaphoresis with vigorous activity.  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   Intentional drug overdose (HCC) 11/26/2021   Neuromuscular disorder (HCC)    neuropathy   Neuropathy    Pre-diabetes    Rhabdomyolysis  02/19/2017   Unilateral primary osteoarthritis, left knee 12/14/2021   Past Surgical History:  Procedure Laterality Date   FOOT ARTHRODESIS Right 09/17/2019   Procedure: FUSION RIGHT LISFRANC JOINT;  Surgeon: Nadara Mustard, MD;  Location: [facility] OR;  Service: Orthopedics;  Laterality: Right;   FOOT ARTHRODESIS Right 09/2019   HEMORROIDECTOMY     IR FLUORO GUIDE CV LINE RIGHT  02/22/2017   IR US GUIDE VASC ACCESS RIGHT  02/22/2017   TOTAL KNEE ARTHROPLASTY Left 12/14/2021   Procedure: LEFT TOTAL KNEE ARTHROPLASTY;  Surgeon: Nadara Mustard, MD;  Location: [facility] OR;  Service: Orthopedics;  Laterality: Left;   TUMOR REMOVAL     From Chest   Social History   Tobacco Use   Smoking status: Former    Packs/day: 0.50    Years: 25.00    Additional pack years: 0.00    Total pack years: 12.50    Types: E-cigarettes, Cigarettes   Smokeless tobacco: Never   Tobacco comments:    Vapes daily  Substance Use Topics   Alcohol use: Yes    Comment: last drink March 2024    Family History  Problem Relation Age of Onset   CAD Mother    Diabetes Father    Stroke Father    Colon cancer Maternal Aunt 60   Diabetes Maternal Aunt    Heart disease Maternal Uncle    Heart attack Maternal Uncle    Esophageal cancer Neg Hx    Rectal cancer Neg Hx  Stomach cancer Neg Hx     Marital Status: Married  ROS  Review of Systems  Constitutional: Positive for diaphoresis.  Cardiovascular:  Positive for chest pain.  Neurological:  Positive for light-headedness.   Objective      06/02/2022    9:22 AM 06/02/2022    8:45 AM 06/02/2022    8:30 AM  Vitals with BMI  Height 6\' 2"     Weight 227 lbs 8 oz    BMI 29.2    Systolic 129 128 161  Diastolic 87 65 79  Pulse 78 66     Blood pressure 129/87, pulse 78, temperature 98.5 F (36.9 C), temperature source Oral, resp. rate 17, height 6\' 2"  (1.88 m), weight 103.2 kg, SpO2 100 %.    Physical Exam Vitals reviewed.  HENT:     Head: Normocephalic and atraumatic.   Cardiovascular:     Rate and Rhythm: Normal rate and regular rhythm.     Pulses: Normal pulses.     Heart sounds: Normal heart sounds. No murmur heard. Pulmonary:     Effort: Pulmonary effort is normal.     Breath sounds: Normal breath sounds.  Abdominal:     General: Bowel sounds are normal.  Musculoskeletal:     Right lower leg: No edema.     Left lower leg: No edema.  Skin:    General: Skin is warm and dry.  Neurological:     Mental Status: He is alert.    Laboratory examination:   Recent Labs    11/26/21 1151 01/04/22 1052 03/30/22 1930 06/02/22 0503  NA 140 138 134* 135  K 3.2* 4.4 3.7 5.1  CL 114* 99 96* 101  CO2 23 23 25  20*  GLUCOSE 82 112* 74 110*  BUN 15 13 17  30*  CREATININE 0.64 1.06 1.09 2.40*  CALCIUM 7.4* 10.1 10.2 10.2  GFRNONAA >60  --  >60 31*   estimated creatinine clearance is 44 mL/min (A) (by C-G formula based on SCr of 2.4 mg/dL (H)).     Latest Ref Rng & Units 06/02/2022    5:03 AM 04/02/2022    6:37 AM 03/30/2022    7:30 PM  CMP  Glucose 70 - 99 mg/dL 096   74   BUN 6 - 20 mg/dL 30   17   Creatinine 0.45 - 1.24 mg/dL 4.09   8.11   Sodium 914 - 145 mmol/L 135   134   Potassium 3.5 - 5.1 mmol/L 5.1   3.7   Chloride 98 - 111 mmol/L 101   96   CO2 22 - 32 mmol/L 20   25   Calcium 8.9 - 10.3 mg/dL 78.2   95.6   Total Protein 6.5 - 8.1 g/dL  6.5  8.1   Total Bilirubin 0.3 - 1.2 mg/dL  0.4  1.0   Alkaline Phos 38 - 126 U/L  89  119   AST 15 - 41 U/L  45  99   ALT 0 - 44 U/L  44  50       Latest Ref Rng & Units 06/02/2022    5:03 AM 03/30/2022    7:30 PM 11/26/2021   11:51 AM  CBC  WBC 4.0 - 10.5 K/uL 11.4  8.7  7.0   Hemoglobin 13.0 - 17.0 g/dL 21.3  08.6  57.8   Hematocrit 39.0 - 52.0 % 45.7  45.2  42.4   Platelets 150 - 400 K/uL 212  188  184  Lipid Panel Recent Labs    08/28/21 0809 04/02/22 0637  CHOL 244* 138  TRIG 65 258*  LDLCALC 180* 51  VLDL 13 52*  HDL 51 35*  CHOLHDL 4.8 3.9    HEMOGLOBIN X3K Lab Results   Component Value Date   HGBA1C 6.2 (H) 04/03/2022   MPG 131 04/03/2022   TSH Recent Labs    08/31/21 0642 03/30/22 1930  TSH 2.828 1.014   BNP (last 3 results) No results for input(s): "BNP" in the last 8760 hours. Cardiac Panel (last 3 results) Recent Labs    06/02/22 0503 06/02/22 0703  TROPONINIHS 80* 102*     Medications and allergies   Allergies  Allergen Reactions   Penicillins Other (See Comments)    Childhood allergy   Pork-Derived Products Other (See Comments)    Patient preference   Latex Rash   Tape Rash     Current Meds  Medication Sig   bictegravir-emtricitabine-tenofovir AF (BIKTARVY) 50-200-25 MG TABS tablet Take 1 tablet by mouth daily.   gabapentin (NEURONTIN) 300 MG capsule Take 2 capsules (600 mg total) by mouth 3 (three) times daily.   hydrOXYzine (ATARAX) 25 MG tablet Take 1 tablet (25 mg total) by mouth daily as needed for anxiety.   ibuprofen (ADVIL) 800 MG tablet Take 1 tablet (800 mg total) by mouth every 8 (eight) hours as needed. (Patient taking differently: Take 800 mg by mouth every 8 (eight) hours as needed for moderate pain.)   methocarbamol (ROBAXIN) 500 MG tablet Take 1 tablet (500 mg total) by mouth every 6 (six) hours as needed for muscle spasms.   omeprazole (PRILOSEC) 40 MG capsule Take 1 capsule (40 mg total) by mouth daily.   QUEtiapine (SEROQUEL) 300 MG tablet Take 1 tablet (300 mg total) by mouth at bedtime.   rosuvastatin (CRESTOR) 20 MG tablet Take 1 tablet (20 mg total) by mouth daily.   traZODone (DESYREL) 50 MG tablet Take 1 tablet (50 mg total) by mouth at bedtime as needed for sleep.    Scheduled Meds:  [START ON 06/03/2022] aspirin EC  81 mg Oral Daily   bictegravir-emtricitabine-tenofovir AF  1 tablet Oral Daily   rosuvastatin  20 mg Oral Daily   sodium chloride flush  3 mL Intravenous Q12H   Continuous Infusions:  sodium chloride 100 mL/hr at 06/02/22 1140   heparin 1,450 Units/hr (06/02/22 1433)   PRN  Meds:.acetaminophen **OR** acetaminophen, albuterol, ondansetron **OR** ondansetron (ZOFRAN) IV   No intake/output data recorded. No intake/output data recorded.  Net IO Since Admission: No IO data has been entered for this period [06/02/22 1518]   Radiology:   Imaging results have been reviewed and ECHOCARDIOGRAM COMPLETE  Result Date: 06/02/2022    ECHOCARDIOGRAM REPORT   Patient Name:   KOY VONDRAN Date of Exam: 06/02/2022 Medical Rec #:  [id number]     Height:       74.0 in Accession #:    [id number]    Weight:       227.5 lb Date of Birth:  1965-06-23     BSA:          2.296 m Patient Age:    56 years      BP:           129/87 mmHg Patient Gender: M             HR:           85 bpm. Exam Location:  Inpatient Procedure: 2D Echo, Cardiac  Doppler and Color Doppler Indications:    R07.9* Chest pain, unspecified, Elevated troponin  History:        Patient has no prior history of Echocardiogram examinations.  Sonographer:    Mike Gip Referring Phys: [phone] RONDELL A SMITH IMPRESSIONS  1. Left ventricular ejection fraction, by estimation, is 55 to 60%. The left ventricle has normal function. The left ventricle has no regional wall motion abnormalities. Left ventricular diastolic parameters are consistent with Grade I diastolic dysfunction (impaired relaxation).  2. Right ventricular systolic function is normal. The right ventricular size is normal. Tricuspid regurgitation signal is inadequate for assessing PA pressure.  3. The mitral valve is normal in structure. No evidence of mitral valve regurgitation. No evidence of mitral stenosis.  4. The aortic valve is tricuspid. There is mild calcification of the aortic valve. Aortic valve regurgitation is not visualized. No aortic stenosis is present.  5. The inferior vena cava is normal in size with greater than 50% respiratory variability, suggesting right atrial pressure of 3 mmHg. FINDINGS  Left Ventricle: Left ventricular ejection fraction, by  estimation, is 55 to 60%. The left ventricle has normal function. The left ventricle has no regional wall motion abnormalities. The left ventricular internal cavity size was normal in size. There is  no left ventricular hypertrophy. Left ventricular diastolic parameters are consistent with Grade I diastolic dysfunction (impaired relaxation). Right Ventricle: The right ventricular size is normal. No increase in right ventricular wall thickness. Right ventricular systolic function is normal. Tricuspid regurgitation signal is inadequate for assessing PA pressure. Left Atrium: Left atrial size was normal in size. Right Atrium: Right atrial size was normal in size. Pericardium: Trivial pericardial effusion is present. Mitral Valve: The mitral valve is normal in structure. No evidence of mitral valve regurgitation. No evidence of mitral valve stenosis. Tricuspid Valve: The tricuspid valve is normal in structure. Tricuspid valve regurgitation is not demonstrated. Aortic Valve: The aortic valve is tricuspid. There is mild calcification of the aortic valve. Aortic valve regurgitation is not visualized. No aortic stenosis is present. Pulmonic Valve: The pulmonic valve was normal in structure. Pulmonic valve regurgitation is not visualized. Aorta: The aortic root is normal in size and structure. Venous: The inferior vena cava is normal in size with greater than 50% respiratory variability, suggesting right atrial pressure of 3 mmHg. IAS/Shunts: No atrial level shunt detected by color flow Doppler.  LEFT VENTRICLE PLAX 2D LVIDd:         4.90 cm      Diastology LVIDs:         2.80 cm      LV e' medial:    8.49 cm/s LV PW:         1.10 cm      LV E/e' medial:  5.9 LV IVS:        1.10 cm      LV e' lateral:   9.14 cm/s LVOT diam:     2.20 cm      LV E/e' lateral: 5.5 LV SV:         71 LV SV Index:   31 LVOT Area:     3.80 cm  LV Volumes (MOD) LV vol d, MOD A2C: 135.0 ml LV vol d, MOD A4C: 122.0 ml LV vol s, MOD A2C: 44.2 ml LV  vol s, MOD A4C: 47.7 ml LV SV MOD A2C:     90.8 ml LV SV MOD A4C:     122.0 ml LV SV MOD BP:  81.7 ml RIGHT VENTRICLE             IVC RV Basal diam:  4.00 cm     IVC diam: 1.90 cm RV S prime:     15.00 cm/s TAPSE (M-mode): 2.4 cm LEFT ATRIUM             Index        RIGHT ATRIUM           Index LA diam:        3.70 cm 1.61 cm/m   RA Area:     16.60 cm LA Vol (A2C):   51.6 ml 22.48 ml/m  RA Volume:   39.50 ml  17.21 ml/m LA Vol (A4C):   43.7 ml 19.03 ml/m LA Biplane Vol: 51.6 ml 22.48 ml/m  AORTIC VALVE LVOT Vmax:   94.20 cm/s LVOT Vmean:  66.000 cm/s LVOT VTI:    0.187 m  AORTA Ao Root diam: 2.90 cm Ao Asc diam:  3.40 cm MITRAL VALVE MV Area (PHT): 3.50 cm    SHUNTS MV Decel Time: 217 msec    Systemic VTI:  0.19 m MV E velocity: 50.10 cm/s  Systemic Diam: 2.20 cm MV A velocity: 48.40 cm/s MV E/A ratio:  1.04 Dalton McleanMD Electronically signed by Wilfred Lacy Signature Date/Time: 06/02/2022/1:57:06 PM    Final    DG Chest 1 View  Result Date: 06/02/2022 CLINICAL DATA:  57 year old male with sudden onset chest heaviness 0230 hours. EXAM: CHEST  1 VIEW COMPARISON:  Chest radiographs 10/28/2021 and earlier. FINDINGS: Portable AP upright view at 0514 hours. Mediastinal contours are stable, within normal limits. Visualized tracheal air column is within normal limits. Normal lung volumes. Allowing for portable technique the lungs are clear. No pneumothorax, pulmonary edema, pleural effusion. Negative visible osseous structures. IMPRESSION: Negative portable chest. Electronically Signed   By: Odessa Fleming M.D.   On: 06/02/2022 05:47    Cardiac Studies:    EKG:  06/02/2022: normal sinus rhythm, rate 93 bpm. Early repolarization abnormality noted.  Assessment & Recommendations:   Troponin elevation suspect NSTEMI Type II from demand ischemia + AKI  Continue on heparin gtt. Can add nitro gtt if persistent chest pain. Troponins are flat and not rising significantly. Echocardiogram performed and  there is no WMA. Nuclear stress test ordered which cannot be performed before Tuesday as inpatient.  If he is discharged home, we can plan for nuclear stress test in our office pending no significant trop elevation or dynamic EKG changes. Chest pain/diaphoresis/light-headedness is associated with positional changes and not with exertion. He does not have these symptoms when he is at the gym as he goes a few times per week. I am comfortably obtaining outpatient stress test if there is no other reason to keep him inpatient until Tuesday. Patient is comfortable with this plan, discussed with primary team. Would d/c on baby ASA 81 mg daily and Crestor 20 mg qHS at least given family history and substance abuse.   AKI Continue to trend Cr Not great candidate for cardiac cath at this time   Polysubstance abuse Cocaine and EtOH dependence Would avoid beta blockers at this time     Clotilde Dieter, DO 06/02/2022, 3:18 PM Office: [phone]

## 2022-06-02 NOTE — ED Notes (Signed)
Date and time results received: 06/02/22 8:45 AM (use smartphrase ".now" to insert current time)  Test: Troponin Critical Value: 102  Name of Provider Notified: Dr. Arlyss Queen  Orders Received? Or Actions Taken?:  No new orders

## 2022-06-02 NOTE — ED Notes (Signed)
ED TO INPATIENT HANDOFF REPORT  ED Nurse Name and Phone #: Virgilio Belling Name/Age/Gender Dylan [name] 57 y.o. male Room/Bed: 031C/031C  Code Status   Code Status: Prior  Home/SNF/Other Home Patient oriented to: self, place, time, and situation Is this baseline? Yes   Triage Complete: Triage complete  Chief Complaint Chest pain [R07.9]  Triage Note Pt with sudden onset chest heaviness arounds 2:30 - 3am as well as sweating. Tried walking home but that made it worse   Allergies Allergies  Allergen Reactions   Penicillins Other (See Comments)    Childhood allergy   Pork-Derived Products Other (See Comments)    Patient preference   Latex Rash   Tape Rash    Level of Care/Admitting Diagnosis ED Disposition     ED Disposition  Admit   Condition  --   Comment  Hospital Area: [facility] [[id number]]  Level of Care: Telemetry Cardiac [103]  May place patient in observation at [facility] or [facility] if equivalent level of care is available:: No  Covid Evaluation: Asymptomatic - no recent exposure (last 10 days) testing not required  Diagnosis: Chest pain [098119]  Admitting Physician: Clydie Braun [[id number]]  Attending Physician: Clydie Braun [[id number]]          B Medical/Surgery History 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   Intentional drug overdose (HCC) 11/26/2021   Neuromuscular disorder (HCC)    neuropathy   Neuropathy    Pre-diabetes    Rhabdomyolysis 02/19/2017   Unilateral primary osteoarthritis, left knee 12/14/2021   Past Surgical History:  Procedure Laterality Date   FOOT ARTHRODESIS Right 09/17/2019   Procedure: FUSION RIGHT LISFRANC JOINT;   Surgeon: Nadara Mustard, MD;  Location: [facility] OR;  Service: Orthopedics;  Laterality: Right;   FOOT ARTHRODESIS Right 09/2019   HEMORROIDECTOMY     IR FLUORO GUIDE CV LINE RIGHT  02/22/2017   IR US GUIDE VASC ACCESS RIGHT  02/22/2017   TOTAL KNEE ARTHROPLASTY Left 12/14/2021   Procedure: LEFT TOTAL KNEE ARTHROPLASTY;  Surgeon: Nadara Mustard, MD;  Location: [facility] OR;  Service: Orthopedics;  Laterality: Left;   TUMOR REMOVAL     From Chest     A IV Location/Drains/Wounds Patient Lines/Drains/Airways Status     Active Line/Drains/Airways     Name Placement date Placement time Site Days   Peripheral IV 06/02/22 18 G Left Antecubital 06/02/22  0452  Antecubital  less than 1   Peripheral IV 06/02/22 18 G Right Forearm 06/02/22  0505  Forearm  less than 1            Intake/Output Last 24 hours No intake or output data in the 24 hours ending 06/02/22 0810  Labs/Imaging Results for orders placed or performed during the hospital encounter of 06/02/22 (from the past 48 hour(s))  Basic metabolic panel     Status: Abnormal   Collection Time: 06/02/22  5:03 AM  Result Value Ref Range   Sodium 135 135 - 145 mmol/L   Potassium 5.1 3.5 - 5.1 mmol/L   Chloride 101 98 - 111 mmol/L   CO2  20 (L) 22 - 32 mmol/L   Glucose, Bld 110 (H) 70 - 99 mg/dL    Comment: Glucose reference range applies only to samples taken after fasting for at least 8 hours.   BUN 30 (H) 6 - 20 mg/dL   Creatinine, Ser 1.61 (H) 0.61 - 1.24 mg/dL   Calcium 09.6 8.9 - 04.5 mg/dL   GFR, Estimated 31 (L) >60 mL/min    Comment: (NOTE) Calculated using the CKD-EPI Creatinine Equation (2021)    Anion gap 14 5 - 15    Comment: Performed at [facility], [address] [place], [place] [address]  Troponin I (High Sensitivity)     Status: Abnormal   Collection Time: 06/02/22  5:03 AM  Result Value Ref Range   Troponin I (High Sensitivity) 80 (H) <18 ng/L    Comment: (NOTE) Elevated high sensitivity troponin I (hsTnI)  values and significant  changes across serial measurements may suggest ACS but many other  chronic and acute conditions are known to elevate hsTnI results.  Refer to the "Links" section for chest pain algorithms and additional  guidance. Performed at [facility], [address], [place], [place] [address]   CBC with Differential     Status: Abnormal   Collection Time: 06/02/22  5:03 AM  Result Value Ref Range   WBC 11.4 (H) 4.0 - 10.5 K/uL   RBC 5.07 4.22 - 5.81 MIL/uL   Hemoglobin 15.2 13.0 - 17.0 g/dL   HCT 82.9 56.2 - 13.0 %   MCV 90.1 80.0 - 100.0 fL   MCH 30.0 26.0 - 34.0 pg   MCHC 33.3 30.0 - 36.0 g/dL   RDW 86.5 78.4 - 69.6 %   Platelets 212 150 - 400 K/uL   nRBC 0.0 0.0 - 0.2 %   Neutrophils Relative % 81 %   Neutro Abs 9.2 (H) 1.7 - 7.7 K/uL   Lymphocytes Relative 12 %   Lymphs Abs 1.4 0.7 - 4.0 K/uL   Monocytes Relative 7 %   Monocytes Absolute 0.8 0.1 - 1.0 K/uL   Eosinophils Relative 0 %   Eosinophils Absolute 0.0 0.0 - 0.5 K/uL   Basophils Relative 0 %   Basophils Absolute 0.0 0.0 - 0.1 K/uL   Immature Granulocytes 0 %   Abs Immature Granulocytes 0.02 0.00 - 0.07 K/uL    Comment: Performed at [facility], [address] [name], [place] [address]   DG Chest 1 View  Result Date: 06/02/2022 CLINICAL DATA:  57 year old male with sudden onset chest heaviness 0230 hours. EXAM: CHEST  1 VIEW COMPARISON:  Chest radiographs 10/28/2021 and earlier. FINDINGS: Portable AP upright view at 0514 hours. Mediastinal contours are stable, within normal limits. Visualized tracheal air column is within normal limits. Normal lung volumes. Allowing for portable technique the lungs are clear. No pneumothorax, pulmonary edema, pleural effusion. Negative visible osseous structures. IMPRESSION: Negative portable chest. Electronically Signed   By: Odessa Fleming M.D.   On: 06/02/2022 05:47    Pending Labs Unresulted Labs (From admission, onward)     Start     Ordered   06/03/22  0500  Heparin level (unfractionated)  Daily,   R      06/02/22 0617   06/03/22 0500  CBC  Daily,   R      06/02/22 0617   06/02/22 1300  Heparin level (unfractionated)  Once-Timed,   URGENT        06/02/22 0617  Vitals/Pain Today's Vitals   06/02/22 0600 06/02/22 0700 06/02/22 0715 06/02/22 0730  BP: 126/83 136/82 (!) 148/84 (!) 143/89  Pulse: 87 85 82 79  Resp: 15 16 15 15   Temp:      TempSrc:      SpO2: 100% 100% 100% 100%  Weight:      Height:      PainSc:        Isolation Precautions No active isolations  Medications Medications  heparin ADULT infusion 100 units/mL (25000 units/263mL) (1,300 Units/hr Intravenous New Bag/Given 06/02/22 0623)  ondansetron (ZOFRAN) injection 4 mg (4 mg Intravenous Given 06/02/22 0521)  morphine (PF) 4 MG/ML injection 4 mg (4 mg Intravenous Given 06/02/22 0521)  heparin bolus via infusion 4,000 Units (4,000 Units Intravenous Bolus from Bag 06/02/22 0624)    Mobility walks     Focused Assessments Cardiac Assessment Handoff:  Cardiac Rhythm: Normal sinus rhythm Lab Results  Component Value Date   CKTOTAL 3,647 (H) 07/05/2021   No results found for: "DDIMER" Does the Patient currently have chest pain? Yes    R Recommendations: See Admitting Provider Note  Report given to:   Additional Notes:

## 2022-06-02 NOTE — H&P (Addendum)
History and Physical    Patient: Dylan [name] ZOX:[id number] DOB: Jan 23, 1965 DOA: 06/02/2022 DOS: the patient was seen and examined on 06/02/2022 PCP: Hoy Register, MD  Patient coming from: work  Chief Complaint:  Chief Complaint  Patient presents with   Chest Pain   HPI: Dylan [name] is a 57 y.o. male with medical history significant of hypertension, hyperlipidemia, prediabetes, HIV, bipolar disorder, polysubstance abuse who presents with complaints of chest pain starting this early this a.m.  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  He developed left sided chest pain that he describes as pressure and did not radiate anywhere.  He noted associated symptoms of some mild shortness of breath and nausea.  Denies having any vomiting, fever, abdominal pain, or dysuria symptoms..  Over the last 3 months patient reports that whenever he is going up a flight of stairs or bending over that he would get lightheaded.  He had been given morphine, nitroglycerin, and aspirin with improvement in pain symptoms.  Patient does admit to using cocaine approximately 5 days ago.  He does vape and denies daily use of alcohol.  Patient reports family history significant coronary artery disease.  In the emergency department patient was noted to be afebrile with mild tachypnea, and all other vital signs relatively maintained.  Labs significant for WBC 11.4, potassium 5.1, BUN 30, creatinine 2.4, high-sensitivity troponin 80->102.  Chest x-ray showed no acute abnormality.  Patient has been given morphine, Zofran, and started on a heparin drip per pharmacy.  Review of Systems: As mentioned in the history of present illness. All other systems reviewed and are negative. Past Medical History:  Diagnosis Date   Anxiety    Arthritis    Bipolar 1 disorder (HCC)    Colon polyps    Depression    GERD (gastroesophageal reflux disease)    Hepatitis B    Human  immunodeficiency virus (HIV) (HCC)    Hyperlipidemia    Hypertension    Insomnia due to other mental disorder 02/03/2020   Intentional drug overdose (HCC) 11/26/2021   Neuromuscular disorder (HCC)    neuropathy   Neuropathy    Pre-diabetes    Rhabdomyolysis 02/19/2017   Unilateral primary osteoarthritis, left knee 12/14/2021   Past Surgical History:  Procedure Laterality Date   FOOT ARTHRODESIS Right 09/17/2019   Procedure: FUSION RIGHT LISFRANC JOINT;  Surgeon: Nadara Mustard, MD;  Location: [facility] OR;  Service: Orthopedics;  Laterality: Right;   FOOT ARTHRODESIS Right 09/2019   HEMORROIDECTOMY     IR FLUORO GUIDE CV LINE RIGHT  02/22/2017   IR US GUIDE VASC ACCESS RIGHT  02/22/2017   TOTAL KNEE ARTHROPLASTY Left 12/14/2021   Procedure: LEFT TOTAL KNEE ARTHROPLASTY;  Surgeon: Nadara Mustard, MD;  Location: [facility] OR;  Service: Orthopedics;  Laterality: Left;   TUMOR REMOVAL     From Chest   Social History:  reports that he has quit smoking. His smoking use included e-cigarettes and cigarettes. He has a 12.50 pack-year smoking history. He has never used smokeless tobacco. He reports current alcohol use. He reports that he does not currently use drugs after having used the following drugs: Cocaine and Marijuana.  Allergies  Allergen Reactions   Penicillins Other (See Comments)    Childhood allergy   Pork-Derived Products Other (See Comments)    Patient preference   Latex Rash   Tape Rash    Family History  Problem Relation Age of Onset  CAD Mother    Diabetes Father    Stroke Father    Colon cancer Maternal Aunt 60   Diabetes Maternal Aunt    Heart disease Maternal Uncle    Heart attack Maternal Uncle    Esophageal cancer Neg Hx    Rectal cancer Neg Hx    Stomach cancer Neg Hx     Prior to Admission medications   Medication Sig Start Date End Date Taking? Authorizing Provider  bictegravir-emtricitabine-tenofovir AF (BIKTARVY) 50-200-25 MG TABS tablet Take 1 tablet by mouth  daily. 11/08/21  Yes Judyann Munson, MD  gabapentin (NEURONTIN) 300 MG capsule Take 2 capsules (600 mg total) by mouth 3 (three) times daily. 04/04/22  Yes Rex Kras, MD  hydrOXYzine (ATARAX) 25 MG tablet Take 1 tablet (25 mg total) by mouth daily as needed for anxiety. 05/15/22 08/13/22 Yes Bahraini, Sarah A  ibuprofen (ADVIL) 800 MG tablet Take 1 tablet (800 mg total) by mouth every 8 (eight) hours as needed. Patient taking differently: Take 800 mg by mouth every 8 (eight) hours as needed for moderate pain. 03/17/22  Yes Adonis Huguenin, NP  methocarbamol (ROBAXIN) 500 MG tablet Take 1 tablet (500 mg total) by mouth every 6 (six) hours as needed for muscle spasms. 03/17/22  Yes Adonis Huguenin, NP  omeprazole (PRILOSEC) 40 MG capsule Take 1 capsule (40 mg total) by mouth daily. 01/04/22  Yes Hoy Register, MD  QUEtiapine (SEROQUEL) 300 MG tablet Take 1 tablet (300 mg total) by mouth at bedtime. 05/15/22 08/13/22 Yes [place]raini, Sarah A  rosuvastatin (CRESTOR) 20 MG tablet Take 1 tablet (20 mg total) by mouth daily. 11/22/21 06/02/23 Yes BranchAlben Spittle, MD  traZODone (DESYREL) 50 MG tablet Take 1 tablet (50 mg total) by mouth at bedtime as needed for sleep. 05/15/22 08/13/22 Yes [place]i, Sarah A  nicotine (NICODERM CQ - DOSED IN MG/24 HOURS) 21 mg/24hr patch Place 1 patch (21 mg total) onto the skin daily. Remove before bedtime. Patient not taking: Reported on 06/02/2022 04/12/22   Daine Gip    Physical Exam: Vitals:   06/02/22 0600 06/02/22 0700 06/02/22 0715 06/02/22 0730  BP: 126/83 136/82 (!) 148/84 (!) 143/89  Pulse: 87 85 82 79  Resp: 15 16 15 15   Temp:      TempSrc:      SpO2: 100% 100% 100% 100%  Weight:      Height:       Constitutional: Middle-age male currently in no acute distress. Eyes: PERRL, lids and conjunctivae normal ENMT: Mucous membranes are moist. Posterior pharynx clear of any exudate or lesions.   Neck: normal, supple, no masses,   Respiratory: clear to auscultation  bilaterally, no wheezing, no crackles. Normal respiratory effort. No accessory muscle use.  Cardiovascular: Regular rate and rhythm, no murmurs / rubs / gallops. No extremity edema.    Abdomen: no tenderness, no masses palpated.   Bowel sounds positive.  Musculoskeletal: no clubbing / cyanosis. No joint deformity upper and lower extremities. Good ROM, no contractures. Normal muscle tone.  Skin: no rashes, lesions, ulcers. No induration Neurologic: CN 2-12 grossly intact. Sensation intact,  Strength 5/5 in all 4.  Psychiatric: Normal judgment and insight. Alert and oriented x 3. Normal mood.   Data Reviewed:  EKG revealed sinus rhythm at 93 bpm with borderline ST elevations noted in the lateral leads and nonspecific T wave abnormality in the inferior leads. Assessment and Plan:  Chest pain Elevated troponin Acute.  Patient presented with complaints of  chest pain left-sided chest pain.  High-sensitivity troponins 80-> 102.  Patient has been given aspirin, nitroglycerin, and morphine with improvement in pain symptoms. -Admit to a telemetry bed -Check echocardiogram -Continue aspirin and statin -Dr. Melton Alar of [facility] cardiology consulted.  Recommended Lexiscan stress test, but not available over the holiday weekend and therefore recommended to have outpatient stress test.  Leukocytosis Acute.  WBC elevated at 11.4. Possible reactive in nature.  Chest x-ray was otherwise noted to be clear. -Follow-up urinalysis -Recheck CBC tomorrow morning  Acute kidney injury  Patient presents with creatinine elevated up to 2.4 with BUN 30.  Baseline creatinine previously noted to be around 1.  Patient with prior history of rhabdomyolysis. -Monitor intake and output -Avoid nephrotoxic agents -Check urinalysis -Check CK level -Normal saline IV fluids at 100 mL/h [facility] inpatient  HIV -Continue Biktarvy  Dyslipidemia Lipid panel from 04/02/2022 noted total cholesterol 138, HDL 35, LDL 51,  triglycerides 258, and VLDL 52. -Continue Crestor  Prediabetes Hemoglobin A1c last noted to be 6.2 on 04/03/2022.  Patient is not on any medications for treatment. -Continue outpatient follow-up with primary  Polysubstance abuse Patient with a prior history of cocaine and marijuana abuse.  He admits to using both approximately 5 days ago. -Check UDS  Obesity BMI 30.17 kg/m  DVT prophylaxis: Heparin Advance Care Planning:   Code Status: Full Code    Consults: Cardiology   Family Communication: Sister  Severity of Illness: The appropriate patient status for this patient is OBSERVATION. Observation status is judged to be reasonable and necessary in order to provide the required intensity of service to ensure the patient's safety. The patient's presenting symptoms, physical exam findings, and initial radiographic and laboratory data in the context of their medical condition is felt to place them at decreased risk for further clinical deterioration. Furthermore, it is anticipated that the patient will be medically stable for discharge from the hospital within 2 midnights of admission.   Author: Clydie Braun, MD 06/02/2022 8:32 AM  For on call review [email or web address].

## 2022-06-02 NOTE — ED Triage Notes (Signed)
Pt with sudden onset chest heaviness arounds 2:30 - 3am as well as sweating. Tried walking home but that made it worse

## 2022-06-02 NOTE — Progress Notes (Signed)
ANTICOAGULATION CONSULT NOTE - Initial Consult  Pharmacy Consult for heparin Indication: chest pain/ACS  Allergies  Allergen Reactions   Penicillins Other (See Comments)    Childhood allergy   Pork-Derived Products Other (See Comments)    Patient preference   Latex Rash   Tape Rash    Patient Measurements: Height: 6\' 2"  (188 cm) Weight: 106.6 kg (235 lb) IBW/kg (Calculated) : 82.2 Heparin Dosing Weight: 104 Kg  Vital Signs: Temp: 98.1 F (36.7 C) (05/24 0458) Temp Source: Oral (05/24 0458) BP: 125/63 (05/24 0530) Pulse Rate: 91 (05/24 0530)  Labs: Recent Labs    06/02/22 0503  HGB 15.2  HCT 45.7  PLT 212  CREATININE 2.40*  TROPONINIHS 80*    Estimated Creatinine Clearance: 44.7 mL/min (A) (by C-G formula based on SCr of 2.4 mg/dL (H)).   Medical History: 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   Intentional drug overdose (HCC) 11/26/2021   Neuromuscular disorder (HCC)    neuropathy   Neuropathy    Pre-diabetes    Rhabdomyolysis 02/19/2017   Unilateral primary osteoarthritis, left knee 12/14/2021   Assessment: 56yoM presenting to the ED with chest pain.  Patient does not endorse taking any oral anticoagulation medications prior to admission. Troponin 80, sCr  2.4 (up from 1.1 in March 2024), Hgb and PLT WNL. Pharmacy consulted to start heparin.  Goal of Therapy:  Heparin level 0.3-0.7 units/ml Monitor platelets by anticoagulation protocol: Yes   Plan:  Give 4000 units bolus x 1 Start heparin infusion at 1300 units/hr Check anti-Xa level in 6 hours and daily while on heparin Continue to monitor H&H and platelets  Ruben Im, PharmD Clinical Pharmacist 06/02/2022 6:14 AM Please  check AMION for all [facility] Pharmacy numbers

## 2022-06-02 NOTE — ED Notes (Signed)
EMS reported giving patient 324 asa and 1 SL nitro

## 2022-06-02 NOTE — Progress Notes (Signed)
ANTICOAGULATION CONSULT NOTE - Initial Consult  Pharmacy Consult for heparin Indication: chest pain/ACS  Allergies  Allergen Reactions   Penicillins Other (See Comments)    Childhood allergy   Pork-Derived Products Other (See Comments)    Patient preference   Latex Rash   Tape Rash    Patient Measurements: Height: 6\' 2"  (188 cm) Weight: 103.2 kg (227 lb 8 oz) IBW/kg (Calculated) : 82.2 Heparin Dosing Weight: 104 Kg  Vital Signs: Temp: 98.5 F (36.9 C) (05/24 0922) Temp Source: Oral (05/24 0922) BP: 129/87 (05/24 0922) Pulse Rate: 78 (05/24 0922)  Labs: Recent Labs    06/02/22 0503 06/02/22 0703 06/02/22 1240  HGB 15.2  --   --   HCT 45.7  --   --   PLT 212  --   --   HEPARINUNFRC  --   --  0.24*  CREATININE 2.40*  --   --   TROPONINIHS 80* 102*  --      Estimated Creatinine Clearance: 44 mL/min (A) (by C-G formula based on SCr of 2.4 mg/dL (H)).   Medical History: 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   Intentional drug overdose (HCC) 11/26/2021   Neuromuscular disorder (HCC)    neuropathy   Neuropathy    Pre-diabetes    Rhabdomyolysis 02/19/2017   Unilateral primary osteoarthritis, left knee 12/14/2021   Assessment:  Dylan [name] presenting to the ED with chest pain.  Patient does not endorse taking any oral anticoagulation medications prior to admission. Troponin 80, sCr  2.4 (up from 1.1 in March 2024), Hgb and PLT WNL. Pharmacy consulted to start heparin.  Heparin drip 1300 uts/hr with heparin level 0.24 < goal  Cbc stable no bleeding   Goal of Therapy:  Heparin level 0.3-0.7 units/ml Monitor platelets by anticoagulation protocol: Yes   Plan:  Increase heparin infusion at 1450  units/hr Check anti-Xa level and CBC daily while on heparin Continue to monitor H&H and platelets     Leota Sauers Pharm.D. CPP, BCPS Clinical Pharmacist [phone] 06/02/2022 1:51 PM   Please check AMION for all [facility] Pharmacy numbers

## 2022-06-02 NOTE — ED Provider Notes (Signed)
[place] EMERGENCY DEPARTMENT AT [facility] Provider Note   CSN: [id number] Arrival date & time: 06/02/22  0454     History  Chief Complaint  Patient presents with   Chest Pain    Dylan [name] is a 57 y.o. male.  The history is provided by the patient.  Chest Pain He has history of hypertension, bipolar disorder, HIV infection, GERD and comes in because of chest pain which started at 2:30 AM while he was going up and down steps.  He describes feeling like he was kicked in the chest and then a heavy feeling in his chest.  There is associated nausea, dyspnea, diaphoresis.  He did take a dose of aspirin and EMS gave him additional aspirin as well as nitroglycerin with partial relief of his discomfort.  He does admit to vaping.  There is a positive family history of premature coronary atherosclerosis.   Home Medications Prior to Admission medications   Medication Sig Start Date End Date Taking? Authorizing Provider  bictegravir-emtricitabine-tenofovir AF (BIKTARVY) 50-200-25 MG TABS tablet Take 1 tablet by mouth daily. 11/08/21   Judyann Munson, MD  gabapentin (NEURONTIN) 300 MG capsule Take 2 capsules (600 mg total) by mouth 3 (three) times daily. 04/04/22   Rex Kras, MD  hydrOXYzine (ATARAX) 25 MG tablet Take 1 tablet (25 mg total) by mouth daily as needed for anxiety. 05/15/22 08/13/22  Bahraini, Sarah A  ibuprofen (ADVIL) 800 MG tablet Take 1 tablet (800 mg total) by mouth every 8 (eight) hours as needed. 03/17/22   Adonis Huguenin, NP  methocarbamol (ROBAXIN) 500 MG tablet Take 1 tablet (500 mg total) by mouth every 6 (six) hours as needed for muscle spasms. 03/17/22   Adonis Huguenin, NP  nicotine (NICODERM CQ - DOSED IN MG/24 HOURS) 21 mg/24hr patch Place 1 patch (21 mg total) onto the skin daily. Remove before bedtime. 04/12/22   [place]i, Sarah A  omeprazole (PRILOSEC) 40 MG capsule Take 1 capsule (40 mg total) by mouth daily. 01/04/22   Hoy Register, MD  QUEtiapine  (SEROQUEL) 300 MG tablet Take 1 tablet (300 mg total) by mouth at bedtime. 05/15/22 08/13/22  B[place]i, Sarah A  rosuvastatin (CRESTOR) 20 MG tablet Take 1 tablet (20 mg total) by mouth daily. 11/22/21 04/12/22  Maisie Fus, MD  traZODone (DESYREL) 50 MG tablet Take 1 tablet (50 mg total) by mouth at bedtime as needed for sleep. 05/15/22 08/13/22  [place]i, Sarah A      Allergies    Penicillins, Pork-derived products, Latex, and Tape    Review of Systems   Review of Systems  Cardiovascular:  Positive for chest pain.  All other systems reviewed and are negative.   Physical Exam Updated Vital Signs BP 121/76 (BP Location: Right Arm)   Pulse 94   Temp 98.1 F (36.7 C) (Oral)   Resp 18   Ht 6\' 2"  (1.88 m)   Wt 106.6 kg   SpO2 100%   BMI 30.17 kg/m  Physical Exam Vitals and nursing note reviewed.   57 year old male, resting comfortably and in no acute distress. Vital signs are normal. Oxygen saturation is 100%, which is normal. Head is normocephalic and atraumatic. PERRLA, EOMI. Oropharynx is clear. Neck is nontender and supple without adenopathy or JVD. Back is nontender and there is no CVA tenderness. Lungs are clear without rales, wheezes, or rhonchi. Chest is nontender. Heart has regular rate and rhythm without murmur. Abdomen is soft, flat, nontender. Extremities have  no cyanosis or edema, full range of motion is present. Skin is warm and diaphoretic without rash. Neurologic: Mental status is normal, cranial nerves are intact, moves all extremities equally.  ED Results / Procedures / Treatments   Labs (all labs ordered are listed, but only abnormal results are displayed) Labs Reviewed  BASIC METABOLIC PANEL  CBC WITH DIFFERENTIAL/PLATELET  TROPONIN I (HIGH SENSITIVITY)    EKG EKG Interpretation  Date/Time:  Friday Jun 02 2022 05:00:21 EDT Ventricular Rate:  93 PR Interval:  141 QRS Duration: 95 QT Interval:  360 QTC Calculation: 448 R Axis:   62 Text  Interpretation: Sinus rhythm Borderline repolarization abnormality Borderline ST elevation, lateral leads Nonspecific T wave abnormality Inferior leads When compared with ECG of 03/30/2022, T wave abnormality Inferior leads is now present Confirmed by Dione Booze ([id number]) on 06/02/2022 5:03:19 AM  Radiology No results found.  Procedures Procedures  Cardiac monitor shows normal sinus rhythm, per my interpretation.  Medications Ordered in ED Medications  ondansetron (ZOFRAN) injection 4 mg (has no administration in time range)  morphine (PF) 4 MG/ML injection 4 mg (has no administration in time range)    ED Course/ Medical Decision Making/ A&P                             Medical Decision Making Amount and/or Complexity of Data Reviewed Labs: ordered. Radiology: ordered.  Risk Prescription drug management. Decision regarding hospitalization.   Chest discomfort concerning for ACS.  I have reviewed and interpreted his electrocardiogram, and my interpretation is new nonspecific T wave changes in the inferior leads.  I have ordered morphine for pain and ondansetron for nausea.  I have ordered laboratory workup of CBC, basic metabolic panel, troponin x 2.  I have low index of suspicion for pulmonary embolism as he has no risk factors for this, doubt pneumonia or aortic dissection.  Chest x-ray shows no acute cardiopulmonary process.  I have independently viewed the image, and agree with the radiologist's interpretation.  I have reviewed and interpreted his laboratory tests and my interpretation is mild leukocytosis which is nonspecific, acute kidney injury with creatinine 2.40 compared with 1.09 on 03/30/2022, mildly elevated troponin of 80.  In the setting of his severe chest discomfort with nausea and dyspnea and diaphoresis, I am treating this as a non-STEMI although repeat troponin is pending.  I have ordered heparin drip.  He continues to be pain-free following morphine.  Of note, patient  states that he has been compliant with his HIV medications, but when last tested on 11/08/2021, HIV RNA was slightly elevated.  I have discussed case with Dr. Julian Reil of [facility], who agrees to admit the patient.  Cardiology has been paged since they will need to evaluate him for possible catheterization.  CRITICAL CARE Performed by: Dione Booze Total critical care time: 50 minutes Critical care time was exclusive of separately billable procedures and treating other patients. Critical care was necessary to treat or prevent imminent or life-threatening deterioration. Critical care was time spent personally by me on the following activities: development of treatment plan with patient and/or surrogate as well as nursing, discussions with consultants, evaluation of patient's response to treatment, examination of patient, obtaining history from patient or surrogate, ordering and performing treatments and interventions, ordering and review of laboratory studies, ordering and review of radiographic studies, pulse oximetry and re-evaluation of patient's condition.  Final Clinical Impression(s) / ED Diagnoses Final diagnoses:  Non-ST  elevated myocardial infarction [facility])  Acute kidney injury (nontraumatic) (HCC)  HIV infection, unspecified symptom status (HCC)    Rx / DC Orders ED Discharge Orders     None         Dione Booze, MD 06/02/22 [id number]

## 2022-06-03 DIAGNOSIS — R0789 Other chest pain: Secondary | ICD-10-CM

## 2022-06-03 DIAGNOSIS — N179 Acute kidney failure, unspecified: Secondary | ICD-10-CM | POA: Diagnosis not present

## 2022-06-03 DIAGNOSIS — I214 Non-ST elevation (NSTEMI) myocardial infarction: Secondary | ICD-10-CM

## 2022-06-03 LAB — CBC
HCT: 43.9 % (ref 39.0–52.0)
Hemoglobin: 14.6 g/dL (ref 13.0–17.0)
MCH: 30.5 pg (ref 26.0–34.0)
MCHC: 33.3 g/dL (ref 30.0–36.0)
MCV: 91.6 fL (ref 80.0–100.0)
Platelets: 170 10*3/uL (ref 150–400)
RBC: 4.79 MIL/uL (ref 4.22–5.81)
RDW: 14.5 % (ref 11.5–15.5)
WBC: 8.3 10*3/uL (ref 4.0–10.5)
nRBC: 0 % (ref 0.0–0.2)

## 2022-06-03 LAB — BASIC METABOLIC PANEL
Anion gap: 8 (ref 5–15)
BUN: 30 mg/dL — ABNORMAL HIGH (ref 6–20)
CO2: 22 mmol/L (ref 22–32)
Calcium: 8.9 mg/dL (ref 8.9–10.3)
Chloride: 104 mmol/L (ref 98–111)
Creatinine, Ser: 1.41 mg/dL — ABNORMAL HIGH (ref 0.61–1.24)
GFR, Estimated: 58 mL/min — ABNORMAL LOW (ref >60)
Glucose, Bld: 98 mg/dL (ref 70–99)
Potassium: 3.8 mmol/L (ref 3.5–5.1)
Sodium: 134 mmol/L — ABNORMAL LOW (ref 135–145)

## 2022-06-03 LAB — HEPARIN LEVEL (UNFRACTIONATED)
Heparin Unfractionated: 0.26 IU/mL — ABNORMAL LOW (ref 0.30–0.70)
Heparin Unfractionated: 0.26 IU/mL — ABNORMAL LOW (ref 0.30–0.70)

## 2022-06-03 MED ORDER — ASPIRIN 81 MG PO TBEC: 81.0000 mg | DELAYED_RELEASE_TABLET | Freq: Every day | ORAL | 12 refills | Status: DC

## 2022-06-03 NOTE — Discharge Summary (Signed)
Physician Discharge Summary  Dylan [name] ZHY:[id number] DOB: January 27, 1965 DOA: 06/02/2022  PCP: Dylan Register, MD  Admit date: 06/02/2022 Discharge date: 06/03/2022  Admitted From: Home Disposition: Home  Recommendations for Outpatient Follow-up:  Follow up with PCP in 1-2 weeks Cardiology will schedule follow-up for possible outpatient stress test. Check CBC and BMP in 1 week.  Discharge Condition: Fair CODE STATUS: Stable Diet recommendation: Low-salt diet  Discharge summary: 57 year old with history of hypertension, hyperlipidemia, prediabetes, HIV, bipolar disorder, polysubstance abuse including cocaine presented with chest pain of sudden onset, diaphoresis and dizziness.  He was brought to the emergency room, given morphine nitroglycerin and aspirin with improvement of symptoms.  Used cocaine 3 days ago.  In the emergency department afebrile and hemodynamically stable.  Creatinine 2.4 with recent normal creatinine.  High-sensitivity troponins 80-102 and flat.  Chest x-ray normal.  EKG with nonischemic lateral T wave changes.  Seen by cardiology on arrival.  Treated with heparin drip and IV fluids overnight and patient is currently asymptomatic.  Creatinine improving.  Troponin elevation, suspect type II injury.  Demand ischemia from dehydration and AKI. Remained chest pain-free since admission.  Currently adequately improved. Echocardiogram without evidence of regional wall motion abnormalities.  Normal ejection fraction. Discharging with aspirin 81 mg daily, he is already on Crestor 20 mg daily at home. Not restarting on beta-blockers, using cocaine. Since acute coronary syndrome ruled out and patient has improved, discharging home with outpatient follow-up with [facility] cardiovascular as well outpatient stress test will be scheduled from the cardiology office. Strong recommendation to stop using cocaine.  Acute kidney injury: Likely multifactorial.  Overnight IV fluids.  Creatinine  2.4-1.4.  Adequate urine output. Will need recheck in about 1 week.  Traumatic rhabdomyolysis: Likely secondary to above.  Patient is already clinically improving.  Urine output is adequate.  Renal functions are normalizing.  Encouraged oral intake and duration.  Chronic medical issues including Hyperlipidemia, continue Crestor HIV, continue Biktarvy Polysubstance abuse, strong recommendation to quit cocaine.  Stable for discharge as per cardiology plan.   Discharge Diagnoses:  Principal Problem:   Chest pain Active Problems:   Leukocytosis   AKI (acute kidney injury) (HCC)   HIV (human immunodeficiency virus infection) (HCC)   Dyslipidemia   Prediabetes   Polysubstance abuse (HCC)   Obesity (BMI 30-39.9)    Discharge Instructions  Discharge Instructions     Call MD for:  difficulty breathing, headache or visual disturbances   Complete by: As directed    Call MD for:  severe uncontrolled pain   Complete by: As directed    Diet - low sodium heart healthy   Complete by: As directed    Increase activity slowly   Complete by: As directed       Allergies as of 06/03/2022       Reactions   Penicillins Other (See Comments)   Childhood allergy   Pork-derived Products Other (See Comments)   Patient preference   Latex Rash   Tape Rash        Medication List     TAKE these medications    aspirin EC 81 MG tablet Take 1 tablet (81 mg total) by mouth daily. Swallow whole. Start taking on: Jun 04, 2022   Biktarvy 50-200-25 MG Tabs tablet Generic drug: bictegravir-emtricitabine-tenofovir AF Take 1 tablet by mouth daily.   gabapentin 300 MG capsule Commonly known as: NEURONTIN Take 2 capsules (600 mg total) by mouth 3 (three) times daily.   hydrOXYzine 25 MG tablet Commonly  known as: ATARAX Take 1 tablet (25 mg total) by mouth daily as needed for anxiety.   ibuprofen 800 MG tablet Commonly known as: ADVIL Take 1 tablet (800 mg total) by mouth every 8  (eight) hours as needed. What changed: reasons to take this   methocarbamol 500 MG tablet Commonly known as: ROBAXIN Take 1 tablet (500 mg total) by mouth every 6 (six) hours as needed for muscle spasms.   nicotine 21 mg/24hr patch Commonly known as: NICODERM CQ - dosed in mg/24 hours Place 1 patch (21 mg total) onto the skin daily. Remove before bedtime.   omeprazole 40 MG capsule Commonly known as: PRILOSEC Take 1 capsule (40 mg total) by mouth daily.   QUEtiapine 300 MG tablet Commonly known as: SEROQUEL Take 1 tablet (300 mg total) by mouth at bedtime.   rosuvastatin 20 MG tablet Commonly known as: CRESTOR Take 1 tablet (20 mg total) by mouth daily.   traZODone 50 MG tablet Commonly known as: DESYREL Take 1 tablet (50 mg total) by mouth at bedtime as needed for sleep.        Follow-up Information     Dylan Register, MD Follow up.   Specialty: Family Medicine Contact information: [address] [place] [address] [place] [address] [phone]         Clotilde Dieter, DO. Schedule an appointment as soon as possible for a visit in 1 week(s).   Specialty: Cardiology Contact information: [address] [place] [address] [phone]                Allergies  Allergen Reactions   Penicillins Other (See Comments)    Childhood allergy   Pork-Derived Products Other (See Comments)    Patient preference   Latex Rash   Tape Rash    Consultations: Cardiology, [name] cardiovascular   Procedures/Studies: ECHOCARDIOGRAM COMPLETE  Result Date: 06/02/2022    ECHOCARDIOGRAM REPORT   Patient Name:   [name] Date of Exam: 06/02/2022 Medical Rec #:  [id number]     Height:       74.0 in Accession #:    [id number]    Weight:       227.5 lb Date of Birth:  1965/07/05     BSA:          2.296 m Patient Age:    57 years      BP:           129/87 mmHg Patient Gender: M             HR:           85 bpm. Exam Location:  Inpatient Procedure: 2D  Echo, Cardiac Doppler and Color Doppler Indications:    R07.9* Chest pain, unspecified, Elevated troponin  History:        Patient has no prior history of Echocardiogram examinations.  Sonographer:    Mike Gip Referring Phys: [phone] RONDELL A SMITH IMPRESSIONS  1. Left ventricular ejection fraction, by estimation, is 55 to 60%. The left ventricle has normal function. The left ventricle has no regional wall motion abnormalities. Left ventricular diastolic parameters are consistent with Grade I diastolic dysfunction (impaired relaxation).  2. Right ventricular systolic function is normal. The right ventricular size is normal. Tricuspid regurgitation signal is inadequate for assessing PA pressure.  3. The mitral valve is normal in structure. No evidence of mitral valve regurgitation. No evidence of mitral stenosis.  4. The aortic valve is tricuspid. There is mild  calcification of the aortic valve. Aortic valve regurgitation is not visualized. No aortic stenosis is present.  5. The inferior vena cava is normal in size with greater than 50% respiratory variability, suggesting right atrial pressure of 3 mmHg. FINDINGS  Left Ventricle: Left ventricular ejection fraction, by estimation, is 55 to 60%. The left ventricle has normal function. The left ventricle has no regional wall motion abnormalities. The left ventricular internal cavity size was normal in size. There is  no left ventricular hypertrophy. Left ventricular diastolic parameters are consistent with Grade I diastolic dysfunction (impaired relaxation). Right Ventricle: The right ventricular size is normal. No increase in right ventricular wall thickness. Right ventricular systolic function is normal. Tricuspid regurgitation signal is inadequate for assessing PA pressure. Left Atrium: Left atrial size was normal in size. Right Atrium: Right atrial size was normal in size. Pericardium: Trivial pericardial effusion is present. Mitral Valve: The mitral valve is  normal in structure. No evidence of mitral valve regurgitation. No evidence of mitral valve stenosis. Tricuspid Valve: The tricuspid valve is normal in structure. Tricuspid valve regurgitation is not demonstrated. Aortic Valve: The aortic valve is tricuspid. There is mild calcification of the aortic valve. Aortic valve regurgitation is not visualized. No aortic stenosis is present. Pulmonic Valve: The pulmonic valve was normal in structure. Pulmonic valve regurgitation is not visualized. Aorta: The aortic root is normal in size and structure. Venous: The inferior vena cava is normal in size with greater than 50% respiratory variability, suggesting right atrial pressure of 3 mmHg. IAS/Shunts: No atrial level shunt detected by color flow Doppler.  LEFT VENTRICLE PLAX 2D LVIDd:         4.90 cm      Diastology LVIDs:         2.80 cm      LV e' medial:    8.49 cm/s LV PW:         1.10 cm      LV E/e' medial:  5.9 LV IVS:        1.10 cm      LV e' lateral:   9.14 cm/s LVOT diam:     2.20 cm      LV E/e' lateral: 5.5 LV SV:         71 LV SV Index:   31 LVOT Area:     3.80 cm  LV Volumes (MOD) LV vol d, MOD A2C: 135.0 ml LV vol d, MOD A4C: 122.0 ml LV vol s, MOD A2C: 44.2 ml LV vol s, MOD A4C: 47.7 ml LV SV MOD A2C:     90.8 ml LV SV MOD A4C:     122.0 ml LV SV MOD BP:      81.7 ml RIGHT VENTRICLE             IVC RV Basal diam:  4.00 cm     IVC diam: 1.90 cm RV S prime:     15.00 cm/s TAPSE (M-mode): 2.4 cm LEFT ATRIUM             Index        RIGHT ATRIUM           Index LA diam:        3.70 cm 1.61 cm/m   RA Area:     16.60 cm LA Vol (A2C):   51.6 ml 22.48 ml/m  RA Volume:   39.50 ml  17.21 ml/m LA Vol (A4C):   43.7 ml 19.03 ml/m LA Biplane Vol: 51.6  ml 22.48 ml/m  AORTIC VALVE LVOT Vmax:   94.20 cm/s LVOT Vmean:  66.000 cm/s LVOT VTI:    0.187 m  AORTA Ao Root diam: 2.90 cm Ao Asc diam:  3.40 cm MITRAL VALVE MV Area (PHT): 3.50 cm    SHUNTS MV Decel Time: 217 msec    Systemic VTI:  0.19 m MV E velocity: 50.10  cm/s  Systemic Diam: 2.20 cm MV A velocity: 48.40 cm/s MV E/A ratio:  1.04 Dalton McleanMD Electronically signed by Wilfred Lacy Signature Date/Time: 06/02/2022/1:57:06 PM    Final    DG Chest 1 View  Result Date: 06/02/2022 CLINICAL DATA:  57 year old male with sudden onset chest heaviness 0230 hours. EXAM: CHEST  1 VIEW COMPARISON:  Chest radiographs 10/28/2021 and earlier. FINDINGS: Portable AP upright view at 0514 hours. Mediastinal contours are stable, within normal limits. Visualized tracheal air column is within normal limits. Normal lung volumes. Allowing for portable technique the lungs are clear. No pneumothorax, pulmonary edema, pleural effusion. Negative visible osseous structures. IMPRESSION: Negative portable chest. Electronically Signed   By: Odessa Fleming M.D.   On: 06/02/2022 05:47   (Echo, Carotid, EGD, Colonoscopy, ERCP)    Subjective: Patient seen and examined.  No overnight events.  Denies any complaints today.  Feels comfortable going home.  Blood pressures are adequate.   Discharge Exam: Vitals:   06/03/22 0047 06/03/22 0634  BP: 109/70 105/71  Pulse: 67 62  Resp: 16 16  Temp: 97.6 F (36.4 C) 98 F (36.7 C)  SpO2: 100% 98%   Vitals:   06/02/22 1703 06/02/22 2030 06/03/22 0047 06/03/22 0634  BP: 121/84 106/70 109/70 105/71  Pulse: 77 81 67 62  Resp: 15 16 16 16   Temp: 98.5 F (36.9 C) 97.7 F (36.5 C) 97.6 F (36.4 C) 98 F (36.7 C)  TempSrc: Oral Oral Oral Oral  SpO2: 100% 99% 100% 98%  Weight:      Height:        General: Pt is alert, awake, not in acute distress Cardiovascular: RRR, S1/S2 +, no rubs, no gallops Respiratory: CTA bilaterally, no wheezing, no rhonchi Abdominal: Soft, NT, ND, bowel sounds + Extremities: no edema, no cyanosis    The results of significant diagnostics from this hospitalization (including imaging, microbiology, ancillary and laboratory) are listed below for reference.     Microbiology: No results found for this or any  previous visit (from the past 240 hour(s)).   Labs: BNP (last 3 results) No results for input(s): "BNP" in the last 8760 hours. Basic Metabolic Panel: Recent Labs  Lab 06/02/22 0503 06/03/22 0137  NA 135 134*  K 5.1 3.8  CL 101 104  CO2 20* 22  GLUCOSE 110* 98  BUN 30* 30*  CREATININE 2.40* 1.41*  CALCIUM 10.2 8.9   Liver Function Tests: No results for input(s): "AST", "ALT", "ALKPHOS", "BILITOT", "PROT", "ALBUMIN" in the last 168 hours. No results for input(s): "LIPASE", "AMYLASE" in the last 168 hours. No results for input(s): "AMMONIA" in the last 168 hours. CBC: Recent Labs  Lab 06/02/22 0503 06/03/22 0137  WBC 11.4* 8.3  NEUTROABS 9.2*  --   HGB 15.2 14.6  HCT 45.7 43.9  MCV 90.1 91.6  PLT 212 170   Cardiac Enzymes: Recent Labs  Lab 06/02/22 1801  CKTOTAL 3,240*   BNP: Invalid input(s): "POCBNP" CBG: No results for input(s): "GLUCAP" in the last 168 hours. D-Dimer No results for input(s): "DDIMER" in the last 72 hours. Hgb A1c No results  for input(s): "HGBA1C" in the last 72 hours. Lipid Profile No results for input(s): "CHOL", "HDL", "LDLCALC", "TRIG", "CHOLHDL", "LDLDIRECT" in the last 72 hours. Thyroid function studies No results for input(s): "TSH", "T4TOTAL", "T3FREE", "THYROIDAB" in the last 72 hours.  Invalid input(s): "FREET3" Anemia work up No results for input(s): "VITAMINB12", "FOLATE", "FERRITIN", "TIBC", "IRON", "RETICCTPCT" in the last 72 hours. Urinalysis    Component Value Date/Time   COLORURINE STRAW (A) 08/31/2021 1230   APPEARANCEUR CLEAR 08/31/2021 1230   LABSPEC 1.009 08/31/2021 1230   PHURINE 7.0 08/31/2021 1230   GLUCOSEU NEGATIVE 08/31/2021 1230   HGBUR NEGATIVE 08/31/2021 1230   BILIRUBINUR NEGATIVE 08/31/2021 1230   KETONESUR NEGATIVE 08/31/2021 1230   PROTEINUR NEGATIVE 08/31/2021 1230   UROBILINOGEN 0.2 03/06/2020 1149   NITRITE NEGATIVE 08/31/2021 1230   LEUKOCYTESUR NEGATIVE 08/31/2021 1230   Sepsis  Labs Recent Labs  Lab 06/02/22 0503 06/03/22 0137  WBC 11.4* 8.3   Microbiology No results found for this or any previous visit (from the past 240 hour(s)).   Time coordinating discharge:  32 minutes  SIGNED:   Dorcas Carrow, MD  [facility] 06/03/2022, 10:53 AM

## 2022-06-03 NOTE — Plan of Care (Signed)

## 2022-06-03 NOTE — Progress Notes (Signed)
Subjective:  Patient seen and examined at bedside, resting comfortably. No chest pain. Telemetry without dynamic EKG changes. He is stable for d/c and wants to leave today.  Intake/Output from previous day:  I/O last 3 completed shifts: In: -  Out: 600 [Urine:600] No intake/output data recorded. Net IO Since Admission: -600 mL [06/03/22 1133]  Blood pressure 105/71, pulse 62, temperature 98 F (36.7 C), temperature source Oral, resp. rate 16, height 6\' 2"  (1.88 m), weight 103.2 kg, SpO2 98 %. Physical Exam Vitals reviewed.  HENT:     Head: Normocephalic and atraumatic.  Cardiovascular:     Rate and Rhythm: Normal rate and regular rhythm.     Pulses: Normal pulses.     Heart sounds: Normal heart sounds. No murmur heard. Pulmonary:     Effort: Pulmonary effort is normal.     Breath sounds: Normal breath sounds.  Abdominal:     General: Bowel sounds are normal.  Musculoskeletal:     Right lower leg: No edema.     Left lower leg: No edema.  Skin:    General: Skin is warm and dry.  Neurological:     Mental Status: He is alert.     Lab Results: Lab Results  Component Value Date   NA 134 (L) 06/03/2022   K 3.8 06/03/2022   CO2 22 06/03/2022   GLUCOSE 98 06/03/2022   BUN 30 (H) 06/03/2022   CREATININE 1.41 (H) 06/03/2022   CALCIUM 8.9 06/03/2022   EGFR 82 01/04/2022   GFRNONAA 58 (L) 06/03/2022    BNP (last 3 results) No results for input(s): "BNP" in the last 8760 hours.  ProBNP (last 3 results) No results for input(s): "PROBNP" in the last 8760 hours.    Latest Ref Rng & Units 06/03/2022    1:37 AM 06/02/2022    5:03 AM 03/30/2022    7:30 PM  BMP  Glucose 70 - 99 mg/dL 98  161  74   BUN 6 - 20 mg/dL 30  30  17    Creatinine 0.61 - 1.24 mg/dL 0.96  0.45  4.09   Sodium 135 - 145 mmol/L 134  135  134   Potassium 3.5 - 5.1 mmol/L 3.8  5.1  3.7   Chloride 98 - 111 mmol/L 104  101  96   CO2 22 - 32 mmol/L 22  20  25    Calcium 8.9 - 10.3 mg/dL 8.9  81.1  91.4        Latest Ref Rng & Units 04/02/2022    6:37 AM 03/30/2022    7:30 PM 11/26/2021   11:51 AM  Hepatic Function  Total Protein 6.5 - 8.1 g/dL 6.5  8.1  6.5   Albumin 3.5 - 5.0 g/dL 3.5  4.8  3.3   AST 15 - 41 U/L 45  99  41   ALT 0 - 44 U/L 44  50  54   Alk Phosphatase 38 - 126 U/L 89  119  65   Total Bilirubin 0.3 - 1.2 mg/dL 0.4  1.0  0.8   Bilirubin, Direct 0.0 - 0.2 mg/dL <7.8         Latest Ref Rng & Units 06/03/2022    1:37 AM 06/02/2022    5:03 AM 03/30/2022    7:30 PM  CBC  WBC 4.0 - 10.5 K/uL 8.3  11.4  8.7   Hemoglobin 13.0 - 17.0 g/dL 29.5  62.1  30.8   Hematocrit 39.0 - 52.0 % 43.9  45.7  45.2   Platelets 150 - 400 K/uL 170  212  188    Lipid Panel     Component Value Date/Time   CHOL 138 04/02/2022 0637   CHOL 158 02/13/2019 1128   TRIG 258 (H) 04/02/2022 0637   HDL 35 (L) 04/02/2022 0637   HDL 41 02/13/2019 1128   CHOLHDL 3.9 04/02/2022 0637   VLDL 52 (H) 04/02/2022 0637   LDLCALC 51 04/02/2022 0637   LDLCALC 97 02/13/2019 1128   LDLCALC 149 (H) 06/06/2018 1406   Cardiac Panel (last 3 results) Recent Labs    06/02/22 1801  CKTOTAL 3,240*    HEMOGLOBIN A1C Lab Results  Component Value Date   HGBA1C 6.2 (H) 04/03/2022   MPG 131 04/03/2022   TSH Recent Labs    08/31/21 0642 03/30/22 1930  TSH 2.828 1.014   Imaging: Imaging results have been reviewed and ECHOCARDIOGRAM COMPLETE  Result Date: 06/02/2022    ECHOCARDIOGRAM REPORT   Patient Name:   Dylan [name] Date of Exam: 06/02/2022 Medical Rec #:  [id number]     Height:       74.0 in Accession #:    [id number]    Weight:       227.5 lb Date of Birth:  July 21, 1965     BSA:          2.296 m Patient Age:    57 years      BP:           129/87 mmHg Patient Gender: M             HR:           85 bpm. Exam Location:  Inpatient Procedure: 2D Echo, Cardiac Doppler and Color Doppler Indications:    R07.9* Chest pain, unspecified, Elevated troponin  History:        Patient has no prior history of Echocardiogram  examinations.  Sonographer:    Mike Gip Referring Phys: [phone] RONDELL A SMITH IMPRESSIONS  1. Left ventricular ejection fraction, by estimation, is 55 to 60%. The left ventricle has normal function. The left ventricle has no regional wall motion abnormalities. Left ventricular diastolic parameters are consistent with Grade I diastolic dysfunction (impaired relaxation).  2. Right ventricular systolic function is normal. The right ventricular size is normal. Tricuspid regurgitation signal is inadequate for assessing PA pressure.  3. The mitral valve is normal in structure. No evidence of mitral valve regurgitation. No evidence of mitral stenosis.  4. The aortic valve is tricuspid. There is mild calcification of the aortic valve. Aortic valve regurgitation is not visualized. No aortic stenosis is present.  5. The inferior vena cava is normal in size with greater than 50% respiratory variability, suggesting right atrial pressure of 3 mmHg. FINDINGS  Left Ventricle: Left ventricular ejection fraction, by estimation, is 55 to 60%. The left ventricle has normal function. The left ventricle has no regional wall motion abnormalities. The left ventricular internal cavity size was normal in size. There is  no left ventricular hypertrophy. Left ventricular diastolic parameters are consistent with Grade I diastolic dysfunction (impaired relaxation). Right Ventricle: The right ventricular size is normal. No increase in right ventricular wall thickness. Right ventricular systolic function is normal. Tricuspid regurgitation signal is inadequate for assessing PA pressure. Left Atrium: Left atrial size was normal in size. Right Atrium: Right atrial size was normal in size. Pericardium: Trivial pericardial effusion is present. Mitral Valve: The mitral valve is normal in structure. No evidence of mitral  valve regurgitation. No evidence of mitral valve stenosis. Tricuspid Valve: The tricuspid valve is normal in structure.  Tricuspid valve regurgitation is not demonstrated. Aortic Valve: The aortic valve is tricuspid. There is mild calcification of the aortic valve. Aortic valve regurgitation is not visualized. No aortic stenosis is present. Pulmonic Valve: The pulmonic valve was normal in structure. Pulmonic valve regurgitation is not visualized. Aorta: The aortic root is normal in size and structure. Venous: The inferior vena cava is normal in size with greater than 50% respiratory variability, suggesting right atrial pressure of 3 mmHg. IAS/Shunts: No atrial level shunt detected by color flow Doppler.  LEFT VENTRICLE PLAX 2D LVIDd:         4.90 cm      Diastology LVIDs:         2.80 cm      LV e' medial:    8.49 cm/s LV PW:         1.10 cm      LV E/e' medial:  5.9 LV IVS:        1.10 cm      LV e' lateral:   9.14 cm/s LVOT diam:     2.20 cm      LV E/e' lateral: 5.5 LV SV:         71 LV SV Index:   31 LVOT Area:     3.80 cm  LV Volumes (MOD) LV vol d, MOD A2C: 135.0 ml LV vol d, MOD A4C: 122.0 ml LV vol s, MOD A2C: 44.2 ml LV vol s, MOD A4C: 47.7 ml LV SV MOD A2C:     90.8 ml LV SV MOD A4C:     122.0 ml LV SV MOD BP:      81.7 ml RIGHT VENTRICLE             IVC RV Basal diam:  4.00 cm     IVC diam: 1.90 cm RV S prime:     15.00 cm/s TAPSE (M-mode): 2.4 cm LEFT ATRIUM             Index        RIGHT ATRIUM           Index LA diam:        3.70 cm 1.61 cm/m   RA Area:     16.60 cm LA Vol (A2C):   51.6 ml 22.48 ml/m  RA Volume:   39.50 ml  17.21 ml/m LA Vol (A4C):   43.7 ml 19.03 ml/m LA Biplane Vol: 51.6 ml 22.48 ml/m  AORTIC VALVE LVOT Vmax:   94.20 cm/s LVOT Vmean:  66.000 cm/s LVOT VTI:    0.187 m  AORTA Ao Root diam: 2.90 cm Ao Asc diam:  3.40 cm MITRAL VALVE MV Area (PHT): 3.50 cm    SHUNTS MV Decel Time: 217 msec    Systemic VTI:  0.19 m MV E velocity: 50.10 cm/s  Systemic Diam: 2.20 cm MV A velocity: 48.40 cm/s MV E/A ratio:  1.04 Dalton McleanMD Electronically signed by Wilfred Lacy Signature Date/Time:  06/02/2022/1:57:06 PM    Final    DG Chest 1 View  Result Date: 06/02/2022 CLINICAL DATA:  57 year old male with sudden onset chest heaviness 0230 hours. EXAM: CHEST  1 VIEW COMPARISON:  Chest radiographs 10/28/2021 and earlier. FINDINGS: Portable AP upright view at 0514 hours. Mediastinal contours are stable, within normal limits. Visualized tracheal air column is within normal limits. Normal lung volumes. Allowing for portable technique the lungs are  clear. No pneumothorax, pulmonary edema, pleural effusion. Negative visible osseous structures. IMPRESSION: Negative portable chest. Electronically Signed   By: Odessa Fleming M.D.   On: 06/02/2022 05:47    Cardiac Studies:  EKG: 06/02/2022: normal sinus rhythm, rate 93 bpm. Early repolarization abnormality noted.  Recent Results (from the past 16109 hour(s))  ECHOCARDIOGRAM COMPLETE   Collection Time: 06/02/22 10:27 AM  Result Value   Weight 3,640   Height 74   BP 129/87   Single Plane A2C EF 67.3   Single Plane A4C EF 60.9   Calc EF 63.4   S' Lateral 2.80   Area-P 1/2 3.50   Est EF 55 - 60%   Narrative      ECHOCARDIOGRAM REPORT       Patient Name:   ZIAD BALOUN Date of Exam: 06/02/2022 Medical Rec #:  [id number]     Height:       74.0 in Accession #:    [id number]    Weight:       227.5 lb Date of Birth:  01-17-1965     BSA:          2.296 m Patient Age:    57 years      BP:           129/87 mmHg Patient Gender: M             HR:           85 bpm. Exam Location:  Inpatient  Procedure: 2D Echo, Cardiac Doppler and Color Doppler  Indications:    R07.9* Chest pain, unspecified, Elevated troponin   History:        Patient has no prior history of Echocardiogram examinations.   Sonographer:    Mike Gip Referring Phys: [phone] RONDELL A SMITH  IMPRESSIONS    1. Left ventricular ejection fraction, by estimation, is 55 to 60%. The left ventricle has normal function. The left ventricle has no regional wall motion abnormalities.  Left ventricular diastolic parameters are consistent with Grade I diastolic  dysfunction (impaired relaxation).  2. Right ventricular systolic function is normal. The right ventricular size is normal. Tricuspid regurgitation signal is inadequate for assessing PA pressure.  3. The mitral valve is normal in structure. No evidence of mitral valve regurgitation. No evidence of mitral stenosis.  4. The aortic valve is tricuspid. There is mild calcification of the aortic valve. Aortic valve regurgitation is not visualized. No aortic stenosis is present.  5. The inferior vena cava is normal in size with greater than 50% respiratory variability, suggesting right atrial pressure of 3 mmHg.  FINDINGS  Left Ventricle: Left ventricular ejection fraction, by estimation, is 55 to 60%. The left ventricle has normal function. The left ventricle has no regional wall motion abnormalities. The left ventricular internal cavity size was normal in size. There is  no left ventricular hypertrophy. Left ventricular diastolic parameters are consistent with Grade I diastolic dysfunction (impaired relaxation).  Right Ventricle: The right ventricular size is normal. No increase in right ventricular wall thickness. Right ventricular systolic function is normal. Tricuspid regurgitation signal is inadequate for assessing PA pressure.  Left Atrium: Left atrial size was normal in size.  Right Atrium: Right atrial size was normal in size.  Pericardium: Trivial pericardial effusion is present.  Mitral Valve: The mitral valve is normal in structure. No evidence of mitral valve regurgitation. No evidence of mitral valve stenosis.  Tricuspid Valve: The tricuspid valve is normal in structure. Tricuspid valve regurgitation is  not demonstrated.  Aortic Valve: The aortic valve is tricuspid. There is mild calcification of the aortic valve. Aortic valve regurgitation is not visualized. No aortic stenosis is present.  Pulmonic Valve:  The pulmonic valve was normal in structure. Pulmonic valve regurgitation is not visualized.  Aorta: The aortic root is normal in size and structure.  Venous: The inferior vena cava is normal in size with greater than 50% respiratory variability, suggesting right atrial pressure of 3 mmHg.  IAS/Shunts: No atrial level shunt detected by color flow Doppler.    LEFT VENTRICLE PLAX 2D LVIDd:         4.90 cm      Diastology LVIDs:         2.80 cm      LV e' medial:    8.49 cm/s LV PW:         1.10 cm      LV E/e' medial:  5.9 LV IVS:        1.10 cm      LV e' lateral:   9.14 cm/s LVOT diam:     2.20 cm      LV E/e' lateral: 5.5 LV SV:         71 LV SV Index:   31 LVOT Area:     3.80 cm   LV Volumes (MOD) LV vol d, MOD A2C: 135.0 ml LV vol d, MOD A4C: 122.0 ml LV vol s, MOD A2C: 44.2 ml LV vol s, MOD A4C: 47.7 ml LV SV MOD A2C:     90.8 ml LV SV MOD A4C:     122.0 ml LV SV MOD BP:      81.7 ml  RIGHT VENTRICLE             IVC RV Basal diam:  4.00 cm     IVC diam: 1.90 cm RV S prime:     15.00 cm/s TAPSE (M-mode): 2.4 cm  LEFT ATRIUM             Index        RIGHT ATRIUM           Index LA diam:        3.70 cm 1.61 cm/m   RA Area:     16.60 cm LA Vol (A2C):   51.6 ml 22.48 ml/m  RA Volume:   39.50 ml  17.21 ml/m LA Vol (A4C):   43.7 ml 19.03 ml/m LA Biplane Vol: 51.6 ml 22.48 ml/m  AORTIC VALVE LVOT Vmax:   94.20 cm/s LVOT Vmean:  66.000 cm/s LVOT VTI:    0.187 m   AORTA Ao Root diam: 2.90 cm Ao Asc diam:  3.40 cm  MITRAL VALVE MV Area (PHT): 3.50 cm    SHUNTS MV Decel Time: 217 msec    Systemic VTI:  0.19 m MV E velocity: 50.10 cm/s  Systemic Diam: 2.20 cm MV A velocity: 48.40 cm/s MV E/A ratio:  1.04  Dalton McleanMD Electronically signed by Wilfred Lacy Signature Date/Time: 06/02/2022/1:57:06 PM       Final     *Note: Due to a large number of results and/or encounters for the requested time period, some results have not been displayed. A complete  set of results can be found in Results Review.    Scheduled Meds:  aspirin EC  81 mg Oral Daily   bictegravir-emtricitabine-tenofovir AF  1 tablet Oral Daily   rosuvastatin  20 mg Oral Daily   sodium chloride flush  3  mL Intravenous Q12H   Continuous Infusions:  sodium chloride Stopped (06/03/22 1112)   heparin Stopped (06/03/22 1113)   PRN Meds:.acetaminophen **OR** acetaminophen, albuterol, ondansetron **OR** ondansetron (ZOFRAN) IV  Assessment & plan Dylan [name] is a 57 y.o. male patient with chest pain and AKI  Troponin elevation suspect NSTEMI Type II from demand ischemia + AKI  Patient is chest pain free. Troponins are flat and not rising significantly. Echocardiogram performed and there is no WMA. Chest pain/diaphoresis/light-headedness is associated with positional changes and not with exertion. He does not have these symptoms when he is at the gym as he goes a few times per week. D/C on baby ASA 81 mg daily and Crestor 20 mg qHS at least given family history and substance abuse.     AKI Creatinine improved He will check CBC and BMP in 1 week     Polysubstance abuse Cocaine and EtOH dependence Would avoid beta blockers at this time    Clotilde Dieter, DO 06/03/2022, 11:33 AM Office: [phone] Fax: [phone] Pager: [phone]

## 2022-06-03 NOTE — Progress Notes (Signed)
ANTICOAGULATION CONSULT NOTE - Follow Up Consult  Pharmacy Consult for heparin Indication: chest pain/ACS  Labs: Recent Labs    06/02/22 0503 06/02/22 0703 06/02/22 1240 06/02/22 1801 06/03/22 0137  HGB 15.2  --   --   --  14.6  HCT 45.7  --   --   --  43.9  PLT 212  --   --   --  170  HEPARINUNFRC  --   --  0.24*  --  0.26*  CREATININE 2.40*  --   --   --  1.41*  CKTOTAL  --   --   --  3,240*  --   TROPONINIHS 80* 102*  --   --   --     Assessment: 56yo male subtherapeutic on heparin after rate change; no infusion issues or signs of bleeding per RN.  Goal of Therapy:  Heparin level 0.3-0.7 units/ml   Plan:  Increase heparin infusion by 1-2 units/kg/hr to 1600 units/hr. Check level in 6 hours.   Vernard Gambles, PharmD, BCPS 06/03/2022 3:13 AM

## 2022-06-06 ENCOUNTER — Telehealth: Payer: Self-pay

## 2022-06-06 NOTE — Transitions of Care (Post Inpatient/ED Visit) (Signed)
06/06/2022  Name: Dylan [name] MRN: [id number] DOB: 02-Aug-1965  Today's TOC FU Call Status: Today's TOC FU Call Status:: Successful TOC FU Call Competed TOC FU Call Complete Date: 06/06/22  Transition Care Management Follow-up Telephone Call Date of Discharge: 06/03/22 Discharge [name] Type of Discharge: Inpatient Admission Primary Inpatient Discharge Diagnosis:: chest pain How have you been since you were released from the hospital?: Better Any questions or concerns?: No  Items Reviewed: Did you receive and understand the discharge instructions provided?: Yes Medications obtained,verified, and reconciled?: No Medications Not Reviewed Reasons:: Other: (he said he has all of his medications but was at work and could not review the list.) Any new allergies since your discharge?: No Dietary orders reviewed?: No Do you have support at home?: No (he stated he lives alone)  Medications Reviewed Today:  He was at work and was not able to review the med list.  Medications Reviewed Today     Reviewed by Larrie Kass, CPhT (Pharmacy Technician) on 06/02/22 at [phone]  Med List Status: Complete   Medication Order Taking? Sig Documenting Provider Last Dose Status Informant  bictegravir-emtricitabine-tenofovir AF (BIKTARVY) 50-200-25 MG TABS tablet [id number] Yes Take 1 tablet by mouth daily. Judyann Munson, MD 06/01/2022 am Active Multiple Informants  gabapentin (NEURONTIN) 300 MG capsule [id number] Yes Take 2 capsules (600 mg total) by mouth 3 (three) times daily. Rex Kras, MD 06/01/2022 1700 Active   hydrOXYzine (ATARAX) 25 MG tablet [id number] Yes Take 1 tablet (25 mg total) by mouth daily as needed for anxiety. Daine Gip 05/30/2022 Active   ibuprofen (ADVIL) 800 MG tablet [id number] Yes Take 1 tablet (800 mg total) by mouth every 8 (eight) hours as needed.  Patient taking differently: Take 800 mg by mouth every 8 (eight) hours as needed for moderate pain.    Adonis Huguenin, NP 06/01/2022 pm Active Multiple Informants  methocarbamol (ROBAXIN) 500 MG tablet [id number] Yes Take 1 tablet (500 mg total) by mouth every 6 (six) hours as needed for muscle spasms. Adonis Huguenin, NP Past Month Active Multiple Informants  nicotine (NICODERM CQ - DOSED IN MG/24 HOURS) 21 mg/24hr patch [id number] No Place 1 patch (21 mg total) onto the skin daily. Remove before bedtime.  Patient not taking: Reported on 06/02/2022   Daine Gip Not Taking Active   omeprazole (PRILOSEC) 40 MG capsule [id number] Yes Take 1 capsule (40 mg total) by mouth daily. Hoy Register, MD 06/01/2022 am Active Multiple Informants  QUEtiapine (SEROQUEL) 300 MG tablet [id number] Yes Take 1 tablet (300 mg total) by mouth at bedtime. Daine Gip 05/31/2022 Active   rosuvastatin (CRESTOR) 20 MG tablet [id number] Yes Take 1 tablet (20 mg total) by mouth daily. Maisie Fus, MD 06/01/2022 am Active Multiple Informants  traZODone (DESYREL) 50 MG tablet [id number] Yes Take 1 tablet (50 mg total) by mouth at bedtime as needed for sleep. Daine Gip Past Week Active             Home Care and Equipment/Supplies: Were Home Health Services Ordered?: No Any new equipment or medical supplies ordered?: No  Functional Questionnaire: Do you need assistance with bathing/showering or dressing?: No Do you need assistance with meal preparation?: No Do you need assistance with eating?: No Do you have difficulty maintaining continence: No Do you need assistance with getting out of bed/getting out of a chair/moving?: No Do you have difficulty managing or taking your medications?: No  Follow up appointments reviewed: PCP Follow-up  appointment confirmed?: No (He said he has the phone number for [facility] and will call to schedule an appt.  He is now living in [facility] and will need to change his Medicaid from [facility]. He may request a virtual visit if he does not have a ride.) MD Provider  Line [phone] Given: No Specialist Hospital Follow-up appointment confirmed?: No Reason Specialist Follow-Up Not Confirmed: Patient has Specialist Provider Number and will Call for Appointment (He needs to call cardiology.  the number is on his AVS) Do you need transportation to your follow-up appointment?: No (he relies on Medicaid transportationhoever he now is living in [facility] [place] [place] so he will need to change his Medicaid to [facility] and they will provide out of county transportation.) Do you understand care options if your condition(s) worsen?: Yes-patient verbalized understanding    SIGNATURE  Robyne Peers, RN

## 2022-06-09 ENCOUNTER — Encounter: Payer: Self-pay | Admitting: Family Medicine

## 2022-06-09 ENCOUNTER — Other Ambulatory Visit: Payer: Self-pay | Admitting: Family Medicine

## 2022-06-09 MED ORDER — SILDENAFIL CITRATE 100 MG PO TABS: 100.0000 mg | ORAL_TABLET | Freq: Every day | ORAL | 2 refills | Status: DC | PRN

## 2022-06-09 NOTE — Telephone Encounter (Signed)
Requested medications are due for refill today.  See pharmacy note  Requested medications are on the active medications list.  yes  Last refill. 06/09/2022 - not filled  Future visit scheduled.   yes  Notes to clinic.  Pharmacy comment: Alternative Requested:NOT COVERED BY INSURANCE.    Requested Prescriptions  Pending Prescriptions Disp Refills   sildenafil (VIAGRA) 100 MG tablet [Pharmacy Med Name: SILDENAFIL 100 MG TABLET] 10 tablet 2    Sig: TAKE 1 TABLET DAILY AS NEEDED FOR ERECTILE DYSFUNCTION. ALLOW AT LEAST 24 HOURS BETWEEN DOSES.     Urology: Erectile Dysfunction Agents Failed - 06/09/2022 10:56 AM      Failed - AST in normal range and within 360 days    AST  Date Value Ref Range Status  04/02/2022 45 (H) 15 - 41 U/L Final         Passed - ALT in normal range and within 360 days    ALT  Date Value Ref Range Status  04/02/2022 44 0 - 44 U/L Final         Passed - Last BP in normal range    BP Readings from Last 1 Encounters:  06/03/22 105/71         Passed - Valid encounter within last 12 months    Recent Outpatient Visits           5 months ago Gastroesophageal reflux disease without esophagitis   [place] [facility] Hoy Register, MD   1 year ago Mixed hyperlipidemia   [place] [facility] Hoy Register, MD   2 years ago Schizoaffective disorder, bipolar type [facility])   [place] [facility] [place], Odette Horns, MD   2 years ago Pure hypercholesterolemia   [place] [facility] Hoy Register, MD   2 years ago Bilateral impacted cerumen   [place] [facility] Hoy Register, MD       Future Appointments             In 1 month Hoy Register, MD [facility]

## 2022-06-09 NOTE — Telephone Encounter (Signed)
Medicaid does not cover sildenafil as the brand, Viagra, but may cover as Revatio. I believe these come in 20mg  tabs if you are comfortable prescribing. Otherwise, he will be unable to use PDE5 inhibitors due to insurance restrictions. Unless, of course, he pays cash.

## 2022-06-12 ENCOUNTER — Encounter: Payer: Self-pay | Admitting: Family Medicine

## 2022-06-26 ENCOUNTER — Ambulatory Visit: Payer: Medicaid Other | Admitting: Orthopedic Surgery

## 2022-06-26 ENCOUNTER — Encounter: Payer: Self-pay | Admitting: Cardiology

## 2022-06-26 ENCOUNTER — Ambulatory Visit: Payer: Medicaid Other | Admitting: Cardiology

## 2022-06-26 VITALS — BP 108/65 | HR 69 | Ht 74.0 in | Wt 238.4 lb

## 2022-06-26 DIAGNOSIS — E7800 Pure hypercholesterolemia, unspecified: Secondary | ICD-10-CM

## 2022-06-26 DIAGNOSIS — F1990 Other psychoactive substance use, unspecified, uncomplicated: Secondary | ICD-10-CM

## 2022-06-26 DIAGNOSIS — R739 Hyperglycemia, unspecified: Secondary | ICD-10-CM

## 2022-06-26 NOTE — Progress Notes (Unsigned)
Primary Physician/Referring:  Hoy Register, MD  Patient ID: Dylan [name], male    DOB: [birth year], 57 y.o.   MRN: [id number]  Chief Complaint  Patient presents with   Non-ST elevated myocardial infarction   Hospitalization Follow-up   HPI:    Dylan [name]  is a 57 y.o.male patient with schizoaffective disorder, bipolar disorder, HIV, hypertension, hypercholesterolemia, prediabetes mellitus,  polysubstance use including cocaine presenting with chest pain and NSTEMI on 06/02/2022 and also acute on chronic renal failure secondary dehydration.  Elevated troponin felt to be type II MI from dehydration with a creatinine of 2.4 which normalized with hydration.  Last cocaine use was 3 days prior to the hospitalization.  He also had abnormal CK enzymes from minor trauma.  He is presently asymptomatic.  States that he is fairly active and has not had any symptoms presently.  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   Intentional drug overdose (HCC) 11/26/2021   Neuromuscular disorder (HCC)    neuropathy   Neuropathy    Pre-diabetes    Rhabdomyolysis 02/19/2017   Unilateral primary osteoarthritis, left knee 12/14/2021   Past Surgical History:  Procedure Laterality Date   FOOT ARTHRODESIS Right 09/17/2019   Procedure: FUSION RIGHT LISFRANC JOINT;  Surgeon: Nadara Mustard, MD;  Location: [facility] OR;  Service: Orthopedics;  Laterality: Right;   FOOT ARTHRODESIS Right 09/2019   HEMORROIDECTOMY     IR FLUORO GUIDE CV LINE RIGHT  02/22/2017   IR US GUIDE VASC ACCESS RIGHT  02/22/2017   TOTAL KNEE ARTHROPLASTY Left 12/14/2021   Procedure: LEFT TOTAL KNEE ARTHROPLASTY;  Surgeon: Nadara Mustard, MD;  Location: [facility] OR;  Service:  Orthopedics;  Laterality: Left;   TUMOR REMOVAL     From Chest   Family History  Problem Relation Age of Onset   CAD Mother    Diabetes Father    Stroke Father    Colon cancer Maternal Aunt 60   Diabetes Maternal Aunt    Heart disease Maternal Uncle    Heart attack Maternal Uncle    Esophageal cancer Neg Hx    Rectal cancer Neg Hx    Stomach cancer Neg Hx     Social History   Tobacco Use   Smoking status: Former    Packs/day: 0.50    Years: 25.00    Additional pack years: 0.00    Total pack years: 12.50    Types: E-cigarettes, Cigarettes   Smokeless tobacco: Never   Tobacco comments:    Vapes daily  Substance Use Topics   Alcohol use: Yes    Comment: last drink March 2024   Marital Status: Married  ROS  Review of Systems  Cardiovascular:  Negative for chest pain, dyspnea on exertion and leg swelling.   Objective      06/26/2022    2:02 PM 06/03/2022    6:34 AM 06/03/2022   12:47 AM  Vitals with BMI  Height 6\' 2"     Weight 238 lbs 6 oz    BMI 30.6    Systolic 108 105 191  Diastolic 65 71 70  Pulse 69 62 67   Blood pressure  108/65, pulse 69, height 6\' 2"  (1.88 m), weight 238 lb 6.4 oz (108.1 kg), SpO2 97 %.  Physical Exam Neck:     Vascular: No carotid bruit or JVD.  Cardiovascular:     Rate and Rhythm: Normal rate and regular rhythm.     Pulses: Intact distal pulses.     Heart sounds: Normal heart sounds. No murmur heard.    No gallop.  Pulmonary:     Effort: Pulmonary effort is normal.     Breath sounds: Normal breath sounds.  Abdominal:     General: Bowel sounds are normal.     Palpations: Abdomen is soft.  Musculoskeletal:     Right lower leg: No edema.     Left lower leg: No edema.    Laboratory examination:   Recent Labs    03/30/22 1930 06/02/22 0503 06/03/22 0137  NA 134* 135 134*  K 3.7 5.1 3.8  CL 96* 101 104  CO2 25 20* 22  GLUCOSE 74 110* 98  BUN 17 30* 30*  CREATININE 1.09 2.40* 1.41*  CALCIUM 10.2 10.2 8.9  GFRNONAA >60  31* 58*    Lab Results  Component Value Date   GLUCOSE 98 06/03/2022   NA 134 (L) 06/03/2022   K 3.8 06/03/2022   CL 104 06/03/2022   CO2 22 06/03/2022   BUN 30 (H) 06/03/2022   CREATININE 1.41 (H) 06/03/2022   GFRNONAA 58 (L) 06/03/2022   CALCIUM 8.9 06/03/2022   PHOS 6.5 (H) 03/11/2017   PROT 6.5 04/02/2022   ALBUMIN 3.5 04/02/2022   LABGLOB 2.8 04/26/2021   AGRATIO 1.7 04/26/2021   BILITOT 0.4 04/02/2022   ALKPHOS 89 04/02/2022   AST 45 (H) 04/02/2022   ALT 44 04/02/2022   ANIONGAP 8 06/03/2022      Lab Results  Component Value Date   ALT 44 04/02/2022   AST 45 (H) 04/02/2022   GGT 43 04/26/2021   ALKPHOS 89 04/02/2022   BILITOT 0.4 04/02/2022       Latest Ref Rng & Units 04/02/2022    6:37 AM 03/30/2022    7:30 PM 11/26/2021   11:51 AM  Hepatic Function  Total Protein 6.5 - 8.1 g/dL 6.5  8.1  6.5   Albumin 3.5 - 5.0 g/dL 3.5  4.8  3.3   AST 15 - 41 U/L 45  99  41   ALT 0 - 44 U/L 44  50  54   Alk Phosphatase 38 - 126 U/L 89  119  65   Total Bilirubin 0.3 - 1.2 mg/dL 0.4  1.0  0.8   Bilirubin, Direct 0.0 - 0.2 mg/dL <1.6      Lipid Panel Recent Labs    08/28/21 0809 04/02/22 0637  CHOL 244* 138  TRIG 65 258*  LDLCALC 180* 51  VLDL 13 52*  HDL 51 35*  CHOLHDL 4.8 3.9   Cardiac troponin I (high-sensitivity) 06/02/2022 80, 102   HEMOGLOBIN A1C Lab Results  Component Value Date   HGBA1C 6.2 (H) 04/03/2022   MPG 131 04/03/2022   TSH Recent Labs    08/31/21 0642 03/30/22 1930  TSH 2.828 1.014   Radiology:  Chest x-ray 06/02/2022: Portable AP upright view at 0514 hours. Mediastinal contours are stable, within normal limits. Visualized tracheal air column is within normal limits. Normal lung volumes. Allowing for portable technique the lungs are clear. No pneumothorax, pulmonary edema, pleural effusion.  Cardiac Studies:   Echocardiogram 06/02/2022:  1. Left ventricular ejection fraction,  by estimation, is 55 to 60%. The left ventricle  has normal function. The left ventricle has no regional wall motion abnormalities. Left ventricular diastolic parameters are consistent with Grade I diastolic  dysfunction (impaired relaxation).  2. Right ventricular systolic function is normal. The right ventricular size is normal. Tricuspid regurgitation signal is inadequate for assessing PA pressure.  3. The mitral valve is normal in structure. No evidence of mitral valve regurgitation. No evidence of mitral stenosis.  4. The aortic valve is tricuspid. There is mild calcification of the aortic valve. Aortic valve regurgitation is not visualized. No aortic stenosis is present.  5. The inferior vena cava is normal in size with greater than 50% respiratory variability, suggesting right atrial pressure of 3 mmHg.  EKG:   EKG 06/26/2022: Normal sinus rhythm at the rate of 73 bpm, LAE, normal axis, frequent PVCs (3).  Otherwise normal EKG.  EKG 06/02/2022: Normal sinus rhythm at the rate of 93 bpm,early repolarization Medications and allergies   Allergies  Allergen Reactions   Penicillins Other (See Comments)    Childhood allergy   Pork-Derived Products Other (See Comments)    Patient preference   Latex Rash   Tape Rash     Medication list   Current Outpatient Medications:    aspirin EC 81 MG tablet, Take 1 tablet (81 mg total) by mouth daily. Swallow whole., Disp: 30 tablet, Rfl: 12   bictegravir-emtricitabine-tenofovir AF (BIKTARVY) 50-200-25 MG TABS tablet, Take 1 tablet by mouth daily., Disp: 30 tablet, Rfl: 11   gabapentin (NEURONTIN) 300 MG capsule, Take 2 capsules (600 mg total) by mouth 3 (three) times daily., Disp: 90 capsule, Rfl: 0   hydrOXYzine (ATARAX) 25 MG tablet, Take 1 tablet (25 mg total) by mouth daily as needed for anxiety., Disp: 30 tablet, Rfl: 2   ibuprofen (ADVIL) 800 MG tablet, Take 1 tablet (800 mg total) by mouth every 8 (eight) hours as needed. (Patient taking differently: Take 800 mg by mouth every 8 (eight) hours  as needed for moderate pain.), Disp: 60 tablet, Rfl: 2   methocarbamol (ROBAXIN) 500 MG tablet, Take 1 tablet (500 mg total) by mouth every 6 (six) hours as needed for muscle spasms., Disp: 30 tablet, Rfl: 0   nicotine polacrilex (NICORETTE) 4 MG gum, Take 4 mg by mouth as needed for smoking cessation., Disp: , Rfl:    omeprazole (PRILOSEC) 40 MG capsule, Take 1 capsule (40 mg total) by mouth daily., Disp: 30 capsule, Rfl: 3   QUEtiapine (SEROQUEL) 300 MG tablet, Take 1 tablet (300 mg total) by mouth at bedtime., Disp: 30 tablet, Rfl: 2   rosuvastatin (CRESTOR) 20 MG tablet, Take 1 tablet (20 mg total) by mouth daily., Disp: 90 tablet, Rfl: 3   sildenafil (VIAGRA) 100 MG tablet, Take 1 tablet (100 mg total) by mouth daily as needed for erectile dysfunction. At least 24 hours between doses., Disp: 10 tablet, Rfl: 2   traZODone (DESYREL) 50 MG tablet, Take 1 tablet (50 mg total) by mouth at bedtime as needed for sleep., Disp: 30 tablet, Rfl: 2  Assessment     ICD-10-CM   1. Polysubstance use disorder  F19.90 EKG 12-Lead    2. Hypercholesteremia  E78.00     3. Hyperglycemia  R73.9        Orders Placed This Encounter  Procedures   EKG 12-Lead    No orders of the defined types were placed in this encounter.   Medications Discontinued During This Encounter  Medication Reason  nicotine (NICODERM CQ - DOSED IN MG/24 HOURS) 21 mg/24hr patch Change in therapy     Recommendations:   Dylan [name] is a 57 y.o. male patient with schizoaffective disorder, bipolar disorder, HIV, hypertension, hypercholesterolemia, prediabetes mellitus,  polysubstance use including cocaine presenting with chest pain and NSTEMI on 06/02/2022 and also acute on chronic renal failure secondary dehydration.  Elevated troponin felt to be type II MI from dehydration with a creatinine of 2.4 which normalized with hydration.  Last cocaine use was 3 days prior to the hospitalization.  He also had abnormal CK enzymes from  minor trauma.  1. Polysubstance use disorder Patient is presently asymptomatic and minimal elevation in serum troponin at 80 and then 102 without EKG abnormality, his only risk factor was substance use that his cocaine.  Patient is active and has no chest pain or dyspnea and is completely asymptomatic except when he used cocaine and he presented to the emergency room with dehydration. Extensive counseling with the patient regarding cocaine use, effect of cocaine on coronary vasospasm and sudden cardiac death, arrhythmic death were discussed with the patient.  I also discussed the patient that it increases the risk of coronary artery disease.  Patient appears motivated.  Lifestyle modification discussed with the patient.  I do not think he needs stress testing or cardiac restratification unless he has recurrence of chest pain.  I reassured him.  Patient has schizoaffective disorder and bipolar disorder from chart review, hence difficult to see whether he would follow through this as long as he is in remission from his medical issues I suspect he probably would want to keep good health.  - EKG 12-Lead  2. Hypercholesteremia Patient is well treated for hypercholesterolemia, LDL is at goal with 20 mg of Crestor, continue the same.  3. Hyperglycemia Patient with hyperglycemia and this also poses risk for coronary artery disease.  Again given normal exam, patient does heavy activity including yard work without any chest pain, no dyspnea, do not think he needs stress testing again.  Low glycemic diet discussed.  Encourage complete abstinence from smoking cigarettes.  Other orders - nicotine polacrilex (NICORETTE) 4 MG gum; Take 4 mg by mouth as needed for smoking cessation.    Dylan Decamp, MD, [facility] 06/29/2022, 6:05 AM Office: [phone]

## 2022-06-28 ENCOUNTER — Encounter: Payer: Self-pay | Admitting: Orthopedic Surgery

## 2022-07-01 ENCOUNTER — Encounter (HOSPITAL_COMMUNITY): Payer: Self-pay | Admitting: Emergency Medicine

## 2022-07-01 ENCOUNTER — Inpatient Hospital Stay (HOSPITAL_COMMUNITY)
Admission: EM | Admit: 2022-07-01 | Discharge: 2022-07-04 | DRG: 558 | Disposition: A | Discharge status: Home / Self Care | Payer: Medicaid Other | Attending: Family Medicine | Admitting: Family Medicine

## 2022-07-01 ENCOUNTER — Emergency Department (HOSPITAL_COMMUNITY): Payer: Medicaid Other

## 2022-07-01 ENCOUNTER — Other Ambulatory Visit: Payer: Self-pay

## 2022-07-01 DIAGNOSIS — E860 Dehydration: Secondary | ICD-10-CM | POA: Diagnosis present

## 2022-07-01 DIAGNOSIS — G8929 Other chronic pain: Secondary | ICD-10-CM | POA: Diagnosis present

## 2022-07-01 DIAGNOSIS — Z833 Family history of diabetes mellitus: Secondary | ICD-10-CM

## 2022-07-01 DIAGNOSIS — F1010 Alcohol abuse, uncomplicated: Secondary | ICD-10-CM | POA: Diagnosis present

## 2022-07-01 DIAGNOSIS — G4700 Insomnia, unspecified: Secondary | ICD-10-CM | POA: Diagnosis present

## 2022-07-01 DIAGNOSIS — Z79899 Other long term (current) drug therapy: Secondary | ICD-10-CM

## 2022-07-01 DIAGNOSIS — F1410 Cocaine abuse, uncomplicated: Secondary | ICD-10-CM | POA: Diagnosis present

## 2022-07-01 DIAGNOSIS — R7989 Other specified abnormal findings of blood chemistry: Secondary | ICD-10-CM | POA: Diagnosis present

## 2022-07-01 DIAGNOSIS — G709 Myoneural disorder, unspecified: Secondary | ICD-10-CM | POA: Diagnosis present

## 2022-07-01 DIAGNOSIS — Z87891 Personal history of nicotine dependence: Secondary | ICD-10-CM

## 2022-07-01 DIAGNOSIS — Z9151 Personal history of suicidal behavior: Secondary | ICD-10-CM

## 2022-07-01 DIAGNOSIS — F411 Generalized anxiety disorder: Secondary | ICD-10-CM | POA: Diagnosis present

## 2022-07-01 DIAGNOSIS — F1510 Other stimulant abuse, uncomplicated: Secondary | ICD-10-CM | POA: Diagnosis present

## 2022-07-01 DIAGNOSIS — B181 Chronic viral hepatitis B without delta-agent: Secondary | ICD-10-CM | POA: Diagnosis present

## 2022-07-01 DIAGNOSIS — Z21 Asymptomatic human immunodeficiency virus [HIV] infection status: Secondary | ICD-10-CM | POA: Diagnosis present

## 2022-07-01 DIAGNOSIS — M6282 Rhabdomyolysis: Principal | ICD-10-CM

## 2022-07-01 DIAGNOSIS — Z91014 Allergy to mammalian meats: Secondary | ICD-10-CM

## 2022-07-01 DIAGNOSIS — Z823 Family history of stroke: Secondary | ICD-10-CM

## 2022-07-01 DIAGNOSIS — K760 Fatty (change of) liver, not elsewhere classified: Secondary | ICD-10-CM | POA: Diagnosis present

## 2022-07-01 DIAGNOSIS — Z880 Allergy status to penicillin: Secondary | ICD-10-CM

## 2022-07-01 DIAGNOSIS — Z91048 Other nonmedicinal substance allergy status: Secondary | ICD-10-CM

## 2022-07-01 DIAGNOSIS — Z96652 Presence of left artificial knee joint: Secondary | ICD-10-CM | POA: Diagnosis present

## 2022-07-01 DIAGNOSIS — F1990 Other psychoactive substance use, unspecified, uncomplicated: Secondary | ICD-10-CM

## 2022-07-01 DIAGNOSIS — Z800 Family history of malignant neoplasm of digestive organs: Secondary | ICD-10-CM

## 2022-07-01 DIAGNOSIS — E785 Hyperlipidemia, unspecified: Secondary | ICD-10-CM | POA: Diagnosis present

## 2022-07-01 DIAGNOSIS — F319 Bipolar disorder, unspecified: Secondary | ICD-10-CM | POA: Diagnosis present

## 2022-07-01 DIAGNOSIS — Z7982 Long term (current) use of aspirin: Secondary | ICD-10-CM

## 2022-07-01 DIAGNOSIS — Z7151 Drug abuse counseling and surveillance of drug abuser: Secondary | ICD-10-CM

## 2022-07-01 DIAGNOSIS — Z8249 Family history of ischemic heart disease and other diseases of the circulatory system: Secondary | ICD-10-CM

## 2022-07-01 DIAGNOSIS — F1910 Other psychoactive substance abuse, uncomplicated: Secondary | ICD-10-CM | POA: Diagnosis present

## 2022-07-01 DIAGNOSIS — K219 Gastro-esophageal reflux disease without esophagitis: Secondary | ICD-10-CM | POA: Diagnosis present

## 2022-07-01 DIAGNOSIS — G629 Polyneuropathy, unspecified: Secondary | ICD-10-CM | POA: Diagnosis present

## 2022-07-01 DIAGNOSIS — R7303 Prediabetes: Secondary | ICD-10-CM | POA: Diagnosis present

## 2022-07-01 DIAGNOSIS — F4310 Post-traumatic stress disorder, unspecified: Secondary | ICD-10-CM | POA: Diagnosis present

## 2022-07-01 DIAGNOSIS — R821 Myoglobinuria: Secondary | ICD-10-CM | POA: Diagnosis present

## 2022-07-01 DIAGNOSIS — R319 Hematuria, unspecified: Secondary | ICD-10-CM | POA: Diagnosis present

## 2022-07-01 DIAGNOSIS — Z91040 Latex allergy status: Secondary | ICD-10-CM

## 2022-07-01 DIAGNOSIS — B20 Human immunodeficiency virus [HIV] disease: Secondary | ICD-10-CM | POA: Diagnosis present

## 2022-07-01 DIAGNOSIS — I252 Old myocardial infarction: Secondary | ICD-10-CM

## 2022-07-01 DIAGNOSIS — E559 Vitamin D deficiency, unspecified: Secondary | ICD-10-CM | POA: Diagnosis present

## 2022-07-01 DIAGNOSIS — N179 Acute kidney failure, unspecified: Secondary | ICD-10-CM | POA: Diagnosis present

## 2022-07-01 LAB — CBC WITH DIFFERENTIAL/PLATELET
Abs Immature Granulocytes: 0.02 10*3/uL (ref 0.00–0.07)
Basophils Absolute: 0 10*3/uL (ref 0.0–0.1)
Basophils Relative: 0 %
Eosinophils Absolute: 0 10*3/uL (ref 0.0–0.5)
Eosinophils Relative: 0 %
HCT: 43.4 % (ref 39.0–52.0)
Hemoglobin: 14.6 g/dL (ref 13.0–17.0)
Immature Granulocytes: 0 %
Lymphocytes Relative: 22 %
Lymphs Abs: 2.3 10*3/uL (ref 0.7–4.0)
MCH: 30.4 pg (ref 26.0–34.0)
MCHC: 33.6 g/dL (ref 30.0–36.0)
MCV: 90.2 fL (ref 80.0–100.0)
Monocytes Absolute: 1.1 10*3/uL — ABNORMAL HIGH (ref 0.1–1.0)
Monocytes Relative: 11 %
Neutro Abs: 7 10*3/uL (ref 1.7–7.7)
Neutrophils Relative %: 67 %
Platelets: 193 10*3/uL (ref 150–400)
RBC: 4.81 MIL/uL (ref 4.22–5.81)
RDW: 13.9 % (ref 11.5–15.5)
WBC: 10.5 10*3/uL (ref 4.0–10.5)
nRBC: 0 % (ref 0.0–0.2)

## 2022-07-01 LAB — HEPATITIS PANEL, ACUTE
HCV Ab: NONREACTIVE
Hep A IgM: NONREACTIVE
Hep B C IgM: NONREACTIVE
Hepatitis B Surface Ag: REACTIVE — AB

## 2022-07-01 LAB — COMPREHENSIVE METABOLIC PANEL
ALT: 203 U/L — ABNORMAL HIGH (ref 0–44)
ALT: 188 U/L — ABNORMAL HIGH (ref 0–44)
AST: 706 U/L — ABNORMAL HIGH (ref 15–41)
AST: 609 U/L — ABNORMAL HIGH (ref 15–41)
Albumin: 4.4 g/dL (ref 3.5–5.0)
Albumin: 3.3 g/dL — ABNORMAL LOW (ref 3.5–5.0)
Alkaline Phosphatase: 88 U/L (ref 38–126)
Alkaline Phosphatase: 81 U/L (ref 38–126)
Anion gap: 15 (ref 5–15)
Anion gap: 6 (ref 5–15)
BUN: 17 mg/dL (ref 6–20)
BUN: 15 mg/dL (ref 6–20)
CO2: 21 mmol/L — ABNORMAL LOW (ref 22–32)
CO2: 23 mmol/L (ref 22–32)
Calcium: 9.5 mg/dL (ref 8.9–10.3)
Calcium: 8.9 mg/dL (ref 8.9–10.3)
Chloride: 99 mmol/L (ref 98–111)
Chloride: 105 mmol/L (ref 98–111)
Creatinine, Ser: 1.51 mg/dL — ABNORMAL HIGH (ref 0.61–1.24)
Creatinine, Ser: 1.17 mg/dL (ref 0.61–1.24)
GFR, Estimated: 54 mL/min — ABNORMAL LOW (ref >60)
GFR, Estimated: >60 mL/min (ref >60)
Glucose, Bld: 93 mg/dL (ref 70–99)
Glucose, Bld: 109 mg/dL — ABNORMAL HIGH (ref 70–99)
Potassium: 3.7 mmol/L (ref 3.5–5.1)
Potassium: 3.2 mmol/L — ABNORMAL LOW (ref 3.5–5.1)
Sodium: 135 mmol/L (ref 135–145)
Sodium: 134 mmol/L — ABNORMAL LOW (ref 135–145)
Total Bilirubin: 1.1 mg/dL (ref 0.3–1.2)
Total Bilirubin: 1.1 mg/dL (ref 0.3–1.2)
Total Protein: 7.8 g/dL (ref 6.5–8.1)
Total Protein: 6.1 g/dL — ABNORMAL LOW (ref 6.5–8.1)

## 2022-07-01 LAB — CK: Total CK: 39211 U/L — ABNORMAL HIGH (ref 49–397)

## 2022-07-01 LAB — URINALYSIS, W/ REFLEX TO CULTURE (INFECTION SUSPECTED)
Bilirubin Urine: NEGATIVE
Glucose, UA: NEGATIVE mg/dL
Ketones, ur: NEGATIVE mg/dL
Nitrite: NEGATIVE
Protein, ur: 30 mg/dL — AB
Specific Gravity, Urine: 1.004 — ABNORMAL LOW (ref 1.005–1.030)
WBC, UA: >50 WBC/hpf (ref 0–5)
pH: 6 (ref 5.0–8.0)

## 2022-07-01 LAB — URINE CULTURE

## 2022-07-01 LAB — ACETAMINOPHEN LEVEL: Acetaminophen (Tylenol), Serum: <10 ug/mL — ABNORMAL LOW (ref 10–30)

## 2022-07-01 MED ORDER — LACTATED RINGERS IV BOLUS
2000.0000 mL | Freq: Once | INTRAVENOUS | Status: AC
  Administered 2022-07-01: 2000 mL via INTRAVENOUS

## 2022-07-01 MED ORDER — PANTOPRAZOLE SODIUM 40 MG PO TBEC
40.0000 mg | DELAYED_RELEASE_TABLET | Freq: Every day | ORAL | Status: DC
  Administered 2022-07-01 – 2022-07-04 (×4): 40 mg via ORAL
  Filled 2022-07-01 (×4): qty 1

## 2022-07-01 MED ORDER — NICOTINE 14 MG/24HR TD PT24
14.0000 mg | MEDICATED_PATCH | Freq: Every day | TRANSDERMAL | Status: DC
  Administered 2022-07-01 – 2022-07-04 (×4): 14 mg via TRANSDERMAL
  Filled 2022-07-01 (×4): qty 1

## 2022-07-01 MED ORDER — ASPIRIN 81 MG PO TBEC
81.0000 mg | DELAYED_RELEASE_TABLET | Freq: Every day | ORAL | Status: DC
  Administered 2022-07-02 – 2022-07-04 (×3): 81 mg via ORAL
  Filled 2022-07-01 (×3): qty 1

## 2022-07-01 MED ORDER — QUETIAPINE FUMARATE 100 MG PO TABS
300.0000 mg | ORAL_TABLET | Freq: Every day | ORAL | Status: DC
  Administered 2022-07-01: 300 mg via ORAL
  Filled 2022-07-01: qty 3

## 2022-07-01 MED ORDER — SODIUM CHLORIDE 0.9 % IV BOLUS: 1000.0000 mL | Freq: Once | INTRAVENOUS | Status: DC

## 2022-07-01 MED ORDER — HYDROXYZINE HCL 25 MG PO TABS
25.0000 mg | ORAL_TABLET | Freq: Every day | ORAL | Status: DC | PRN
  Administered 2022-07-01 – 2022-07-02 (×2): 25 mg via ORAL
  Filled 2022-07-01 (×2): qty 1

## 2022-07-01 MED ORDER — LACTATED RINGERS IV SOLN: INTRAVENOUS | Status: DC

## 2022-07-01 MED ORDER — BICTEGRAVIR-EMTRICITAB-TENOFOV 50-200-25 MG PO TABS
1.0000 | ORAL_TABLET | Freq: Every day | ORAL | Status: DC
  Administered 2022-07-02 – 2022-07-04 (×3): 1 via ORAL
  Filled 2022-07-01 (×3): qty 1

## 2022-07-01 MED ORDER — METHOCARBAMOL 500 MG PO TABS
500.0000 mg | ORAL_TABLET | Freq: Four times a day (QID) | ORAL | Status: DC | PRN
  Administered 2022-07-01 – 2022-07-02 (×2): 500 mg via ORAL
  Filled 2022-07-01 (×2): qty 1

## 2022-07-01 MED ORDER — ROSUVASTATIN CALCIUM 20 MG PO TABS
20.0000 mg | ORAL_TABLET | Freq: Every day | ORAL | Status: DC
  Administered 2022-07-01: 20 mg via ORAL
  Filled 2022-07-01: qty 1

## 2022-07-01 MED ORDER — TRAZODONE HCL 50 MG PO TABS
50.0000 mg | ORAL_TABLET | Freq: Every evening | ORAL | Status: DC | PRN
  Administered 2022-07-01 – 2022-07-03 (×3): 50 mg via ORAL
  Filled 2022-07-01 (×3): qty 1

## 2022-07-01 MED ORDER — GABAPENTIN 300 MG PO CAPS
600.0000 mg | ORAL_CAPSULE | Freq: Three times a day (TID) | ORAL | Status: DC
  Administered 2022-07-01 – 2022-07-04 (×8): 600 mg via ORAL
  Filled 2022-07-01 (×8): qty 2

## 2022-07-01 MED ORDER — ENOXAPARIN SODIUM 40 MG/0.4ML IJ SOSY
40.0000 mg | PREFILLED_SYRINGE | INTRAMUSCULAR | Status: DC
  Administered 2022-07-01 – 2022-07-03 (×3): 40 mg via SUBCUTANEOUS
  Filled 2022-07-01 (×3): qty 0.4

## 2022-07-01 NOTE — H&P (Cosign Needed Addendum)
Hospital Admission History and Physical Service Pager: [phone]  Patient name: Dylan [name] Medical record number: [id number] Date of Birth: [birth year] Age: 57 y.o. Gender: male  Primary Care Provider: Hoy Register, MD Consultants: None Code Status: Full Preferred Emergency Contact:  Contact Information     Name Relation Home Work [place] Brother   [phone]   [facility] Sister   [phone]      Chief Complaint: Dark urine  Assessment and Plan: Dylan [name] is a 57 y.o. male presenting with rhabdomyolysis.  Likely due to a combination of cocaine use, dehydration, and recent strenuous activity.  Also considered crush injury/immobility, statin use, seizure, and poly/dermatomyositis, though history, exam, and studies make these less likely.  Hospital Problem List      Hospital     * (Principal) Rhabdomyolysis     Vital signs stable.  Exam reassuring.  CK over 39,000.  Elevated LFTs  likely from muscle breakdown. Creatinine 1.5, close to baseline of 1.4.   Likely in setting of recent cocaine use and possibly exacerbated by  dehydration in setting of long bike ride and [place]  food [place].  He is status post 2 L LR bolus and is on  maintenance LR fluids per ED.  Given need for continued fluids and  monitoring given history of need for dialysis with rhabdo, will admit to  [facility] TS attending Dr. Manson Passey. -Increase maintenance fluids to 250 mL/h -A.m. CK -Encourage p.o. intake -Tylenol 650 mg every 6 as needed for pain -Strict I/O's -Vitals per unit routine        HIV (human immunodeficiency virus infection) (HCC)     Continue HAART        Elevated creatining on likely CKD     Creatinine close to baseline as above.  Likely due to overwhelming  levels of CK.  Will pay close attention to renal function given need of  dialysis in the past. - Continue fluids as above - CMP at 11 PM, CMP at 5 AM        Chronic  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 coma (HCC)     Polysubstance abuse (HCC)     Endorses continued cocaine, marijuana, nicotine, alcohol use.  Cocaine  likely contributor to previous hospitalizations for rhabdo.  Less concern  for alcohol withdrawal given not a daily drinker.  Used to smoke  cigarettes for over 20 years and now vapes nicotine. - TOC consult for substance use - Nicotine patch 14 mg starting, assess response     Chronic problems: Insomnia (continue trazodone), HLD (continue home Crestor), bipolar 1 disorder (continue Seroquel, Atarax as needed), GERD (continue Prilosec), NSTEMI history (obtain EKG this hospitalization)  FEN/GI: Regular diet VTE Prophylaxis: Lovenox  Disposition: Medsurg  History of Present Illness:  Dylan [name] is a 57 y.o. male presenting with rhabdomyolysis.  Had dark urine today whenever he was [place] the food [place].  He is it was very hot today, he has not been staying very hydrated.  He has had diffuse weakness and pain in his muscles.  He did cocaine about 4 days ago.  He has not had any new trauma.  He has been taking his statin for a while.  He go on a longer bike ride in the last couple of days than usual.  He denies any fevers, chills, nausea, vomiting, constipation, diarrhea, urinary changes like pain or frequency.  He does  not mention shortness of breath or chest pain.  In the ED, vitals were stable and exam overall unremarkable.  Labs remarkable with elevated CK, LFTs.  UA with blood moderate leuks white blood cells.  Right upper quadrant ultrasound normal.  He was given 2 L bolus of LR and started on maintenance IV fluids.  He was called for admission for further treatment.  Review Of Systems: Per HPI  Pertinent Past Medical History: Nontraumatic rhabdomyolysis causing severe kidney injury requiring dialysis Chronic hepatitis B Bipolar 1 disorder s/p multiple inpatient psych admissions and 4x suicide  attempts Alcohol use disorder NSTEMI Cocaine use DDD HLD Fatty liver Generalized anxiety Prediabetes PTSD Vitamin D deficiency Tobacco use GERD Insomnia Neuropathy Remainder reviewed in history tab.   Pertinent Past Surgical History: Total knee replacement left in 2023 Foot arthrodesis in 2021 Hemorrhoidectomy Tumor removal from chest per chart review? Remainder reviewed in history tab.   Pertinent Social History: Tobacco use: Former, about 20-30 pack years, now smokes nicotine from vape Alcohol use: Socially Other Substance use: Cocaine, marijuana socially  Pertinent Family History:  Remainder reviewed in history tab.   Important Outpatient Medications: Mother-CAD Father-diabetes, stroke Maternal aunt-diabetes, colon cancer Maternal uncle-heart disease Remainder reviewed in medication history.   Objective: BP 126/84 (BP Location: Right Arm)   Pulse 77   Temp 98 F (36.7 C) (Oral)   Resp 16   SpO2 100%  Exam: General: Lying in bed, conversant, no acute distress Eyes: Extraocular movements grossly intact Cardiovascular: Regular rate and rhythm without murmurs rubs or gallops Respiratory: Clear to auscultation bilaterally anteriorly without wheezes rales or rhonchi Gastrointestinal: Soft, nontender, nondistended, normoactive bowel sounds MSK: Moves all extremities grossly equally Neuro: Cranial nerves II through XII grossly intact, strength grossly intact throughout Psych: Appropriate mood and affect  Labs:  CBC BMET  Recent Labs  Lab 07/01/22 1235  WBC 10.5  HGB 14.6  HCT 43.4  PLT 193   Recent Labs  Lab 07/01/22 1235  NA 135  K 3.7  CL 99  CO2 21*  BUN 17  CREATININE 1.51*  GLUCOSE 93  CALCIUM 9.5     Urinalysis    Component Value Date/Time   COLORURINE YELLOW 07/01/2022 1240   APPEARANCEUR HAZY (A) 07/01/2022 1240   LABSPEC 1.004 (L) 07/01/2022 1240   PHURINE 6.0 07/01/2022 1240   GLUCOSEU NEGATIVE 07/01/2022 1240   HGBUR LARGE  (A) 07/01/2022 1240   BILIRUBINUR NEGATIVE 07/01/2022 1240   KETONESUR NEGATIVE 07/01/2022 1240   PROTEINUR 30 (A) 07/01/2022 1240   UROBILINOGEN 0.2 03/06/2020 1149   NITRITE NEGATIVE 07/01/2022 1240   LEUKOCYTESUR MODERATE (A) 07/01/2022 1240   CK 392[address] Tylenol <10 Hepatitis panel pending UDS pending  EKG: Pending  Imaging Studies Performed: RUQ US IMPRESSION: Normal right upper quadrant ultrasound examination.  Evette Georges, MD 07/01/2022, 6:27 PM PGY-1, [facility] FPTS Intern pager: [phone], text pages welcome Secure chat group [facility] Teaching Service   I was personally present and performed or re-performed the history, physical exam and medical decision making activities of this service and have verified that the service and findings are accurately documented in the student's note.  Bess Kinds, MD                  07/01/2022, 9:46 PM

## 2022-07-01 NOTE — ED Provider Notes (Signed)
[place] EMERGENCY DEPARTMENT AT [facility] Provider Note   CSN: [id number] Arrival date & time: 07/01/22  1153    History  Dark urine   Dylan [name] is a 57 y.o. male history of HIV, chronic hepatitis B, polysubstance use, EtOH use, bipolar, recurrent rhabdomyolysis here for evaluation of dark urine.  He had 1 episode of dark urine earlier today.  Has not urinated since.  No dysuria. Able to fully empty bladder.  Was recently started on baby aspirin after MI 1 month ago.  No other anticoagulation.  No fever, nausea, vomiting, chest pain, shortness of breath, abdominal pain, pain or swelling to extremities.  No pain, penile discharge. Does have some generalized myalgias which started early today.  No recent falls or injuries   1 episode hematuria. No dysuria, abd pain, frequency  Couple of beers last night Tylenol 2x daily for chronic pain  HPI     Home Medications Prior to Admission medications   Medication Sig Start Date End Date Taking? Authorizing Provider  aspirin EC 81 MG tablet Take 1 tablet (81 mg total) by mouth daily. Swallow whole. 06/04/22   Dorcas Carrow, MD  bictegravir-emtricitabine-tenofovir AF (BIKTARVY) 50-200-25 MG TABS tablet Take 1 tablet by mouth daily. 11/08/21   Judyann Munson, MD  gabapentin (NEURONTIN) 300 MG capsule Take 2 capsules (600 mg total) by mouth 3 (three) times daily. 04/04/22   Rex Kras, MD  hydrOXYzine (ATARAX) 25 MG tablet Take 1 tablet (25 mg total) by mouth daily as needed for anxiety. 05/15/22 08/13/22  Bahraini, Sarah A  ibuprofen (ADVIL) 800 MG tablet Take 1 tablet (800 mg total) by mouth every 8 (eight) hours as needed. Patient taking differently: Take 800 mg by mouth every 8 (eight) hours as needed for moderate pain. 03/17/22   Adonis Huguenin, NP  methocarbamol (ROBAXIN) 500 MG tablet Take 1 tablet (500 mg total) by mouth every 6 (six) hours as needed for muscle spasms. 03/17/22   Adonis Huguenin, NP  nicotine polacrilex  (NICORETTE) 4 MG gum Take 4 mg by mouth as needed for smoking cessation.    [provider]  omeprazole (PRILOSEC) 40 MG capsule Take 1 capsule (40 mg total) by mouth daily. 01/04/22   Hoy Register, MD  QUEtiapine (SEROQUEL) 300 MG tablet Take 1 tablet (300 mg total) by mouth at bedtime. 05/15/22 08/13/22  Bahraini, Sarah A  rosuvastatin (CRESTOR) 20 MG tablet Take 1 tablet (20 mg total) by mouth daily. 11/22/21 06/02/23  Maisie Fus, MD  sildenafil (VIAGRA) 100 MG tablet Take 1 tablet (100 mg total) by mouth daily as needed for erectile dysfunction. At least 24 hours between doses. 06/09/22   Hoy Register, MD  traZODone (DESYREL) 50 MG tablet Take 1 tablet (50 mg total) by mouth at bedtime as needed for sleep. 05/15/22 08/13/22  Bahraini, Sarah A      Allergies    Penicillins, Pork-derived products, Latex, and Tape    Review of Systems   Review of Systems  Constitutional: Negative.   HENT: Negative.    Respiratory: Negative.    Cardiovascular: Negative.   Gastrointestinal: Negative.   Genitourinary:  Positive for hematuria.  Musculoskeletal: Negative.   Skin: Negative.   Neurological: Negative.   All other systems reviewed and are negative.   Physical Exam Updated Vital Signs BP 122/82 (BP Location: Right Arm)   Pulse 78   Temp 98.1 F (36.7 C) (Oral)   Resp 16   SpO2 100%  Physical Exam  Vitals and nursing note reviewed.  Constitutional:      General: He is not in acute distress.    Appearance: He is well-developed. He is not ill-appearing, toxic-appearing or diaphoretic.  HENT:     Head: Normocephalic and atraumatic.     Nose: Nose normal.     Mouth/Throat:     Mouth: Mucous membranes are moist.  Eyes:     Pupils: Pupils are equal, round, and reactive to light.  Cardiovascular:     Rate and Rhythm: Normal rate and regular rhythm.     Pulses: Normal pulses.     Heart sounds: Normal heart sounds.  Pulmonary:     Effort: Pulmonary effort is normal. No  respiratory distress.     Breath sounds: Normal breath sounds.  Abdominal:     General: Bowel sounds are normal. There is no distension.     Palpations: Abdomen is soft.     Tenderness: There is no abdominal tenderness. There is no right CVA tenderness, left CVA tenderness, guarding or rebound.  Musculoskeletal:        General: No swelling, tenderness, deformity or signs of injury. Normal range of motion.     Cervical back: Normal range of motion and neck supple.     Right lower leg: No edema.     Left lower leg: No edema.  Skin:    General: Skin is warm and dry.     Capillary Refill: Capillary refill takes less than 2 seconds.  Neurological:     General: No focal deficit present.     Mental Status: He is alert and oriented to person, place, and time.     ED Results / Procedures / Treatments   Labs (all labs ordered are listed, but only abnormal results are displayed) Labs Reviewed  CBC WITH DIFFERENTIAL/PLATELET - Abnormal; Notable for the following components:      Result Value   Monocytes Absolute 1.1 (*)    All other components within normal limits  COMPREHENSIVE METABOLIC PANEL - Abnormal; Notable for the following components:   CO2 21 (*)    Creatinine, Ser 1.51 (*)    AST 706 (*)    ALT 203 (*)    GFR, Estimated 54 (*)    All other components within normal limits  CK - Abnormal; Notable for the following components:   Total CK 39,211 (*)    All other components within normal limits  URINALYSIS, W/ REFLEX TO CULTURE (INFECTION SUSPECTED) - Abnormal; Notable for the following components:   APPearance HAZY (*)    Specific Gravity, Urine 1.004 (*)    Hgb urine dipstick LARGE (*)    Protein, ur 30 (*)    Leukocytes,Ua MODERATE (*)    Bacteria, UA RARE (*)    All other components within normal limits  ACETAMINOPHEN LEVEL - Abnormal; Notable for the following components:   Acetaminophen (Tylenol), Serum <10 (*)    All other components within normal limits  URINE  CULTURE  HEPATITIS PANEL, ACUTE  RAPID URINE DRUG SCREEN, HOSP PERFORMED    EKG None  Radiology No results found.  Procedures .Critical Care  Performed by: Linwood Dibbles, PA-C Authorized by: Linwood Dibbles, PA-C   Critical care provider statement:    Critical care time (minutes):  35   Critical care was time spent personally by me on the following activities:  Development of treatment plan with patient or surrogate, discussions with consultants, evaluation of patient's response to treatment, examination of patient, ordering  and review of laboratory studies, ordering and review of radiographic studies, ordering and performing treatments and interventions, pulse oximetry, re-evaluation of patient's condition and review of old charts     Medications Ordered in ED Medications  lactated ringers infusion (has no administration in time range)  lactated ringers bolus 2,000 mL (2,000 mLs Intravenous New Bag/Given 07/01/22 1444)   ED Course/ Medical Decision Making/ A&P    57 year old history of HIV, chronic hep B, polysubstance use, EtOH use, bipolar here for evaluation of dark urine and myalgias.  Has history of recurrent nontraumatic rhabdomyolysis.  Does have polysubstance use, continuing to use as well as EtOH use.  No abdominal pain, dysuria, hematuria, flank pain.  He is on 81 mg baby aspirin daily.  He is unsure if his urine was just dark or if there is blood present.  Appears overall well.  Heart and lungs clear.  Abdomen soft, nontender.  Will plan on labs, imaging and reassess  Labs and imaging personally viewed and interpreted:  CBC without leukocytosis 9 metabolic panel creatinine 1.51, similar to prior, AST 706, ALT 203, normal alk phos, T. bili, significant increase from baseline.  Does admit to EtOH use, chronic hep B. UA with blood, moderate leuks, Wbc CK 39,211  Patient does admit daily Tylenol use, up to 2 times daily.  Will obtain Tylenol level as well as acute  hepatitis panel.  He does have 3-1 elevation in his LFTs concern likely due to his chronic EtOH use.  He has no right upper quadrant abdominal pain to suggest acute cholecystitis, choledocholithiasis or cholelithiasis.  Will add on ultrasound, IV fluids.  Patient will need admission for nontraumatic rhabdomyolysis  I discussed with oncoming PA, Dylan [name] who will follow-up on ultrasound and admission for nontraumatic rhabdomyolysis                            Medical Decision Making Amount and/or Complexity of Data Reviewed External Data Reviewed: labs, radiology and notes. Labs: ordered. Decision-making details documented in ED Course. Radiology: ordered and independent interpretation performed. Decision-making details documented in ED Course.  Risk OTC drugs. Prescription drug management. Parenteral controlled substances. Decision regarding hospitalization. Diagnosis or treatment significantly limited by social determinants of health.          Final Clinical Impression(s) / ED Diagnoses Final diagnoses:  Non-traumatic rhabdomyolysis  Elevated LFTs  Hematuria, unspecified type  Polysubstance use disorder    Rx / DC Orders ED Discharge Orders     None         Shekelia Boutin A, PA-C 07/01/22 1522    Gloris Manchester, MD 07/02/22 1241

## 2022-07-01 NOTE — Assessment & Plan Note (Signed)
Endorses continued cocaine, marijuana, nicotine, alcohol use.  Cocaine likely contributor to previous hospitalizations for rhabdo.  Less concern for alcohol withdrawal given not a daily drinker.  Used to smoke cigarettes for over 20 years and now vapes nicotine. - TOC consult for substance use - Nicotine patch 14 mg starting, assess response

## 2022-07-01 NOTE — Assessment & Plan Note (Addendum)
Vital signs stable.  Exam reassuring.  CK over 39,000.  Elevated LFTs likely from muscle breakdown. Creatinine 1.5, close to baseline of 1.4.  Likely in setting of recent cocaine use and possibly exacerbated by dehydration in setting of long bike ride and being out in the heat [place] the food [place] today.  He is status post 2 L LR bolus and is on maintenance LR fluids per ED.  Given need for continued fluids and monitoring given history of need for dialysis with rhabdo, will admit to [facility] attending Dr. Manson Passey. -Increase maintenance fluids to 250 mL/h -A.m. CK -Encourage p.o. intake -Tylenol 650 mg every 6 as needed for pain -Vitals per unit routine

## 2022-07-01 NOTE — ED Notes (Signed)
ED TO INPATIENT HANDOFF REPORT  ED Nurse Name and Phone #: Maralyn Sago [phone]  S Name/Age/Gender Dylan [name] 57 y.o. male Room/Bed: H022C/H022C  Code Status   Code Status: Full Code  Home/SNF/Other Home Patient oriented to: self, place, time, and situation Is this baseline? Yes   Triage Complete: Triage complete  Chief Complaint Rhabdomyolysis [M62.82]  Triage Note Patient reports having one episode of dark colored urine today and is concerned for dehydration d/t previous history of rhabdo. Denies pain and reports normal PO intake.    Allergies Allergies  Allergen Reactions   Penicillins Other (See Comments)    Childhood allergy   Pork-Derived Products Other (See Comments)    Patient preference   Latex Rash   Tape Rash    Level of Care/Admitting Diagnosis ED Disposition     ED Disposition  Admit   Condition  --   Comment  Hospital Area: [facility] [[id number]]  Level of Care: Med-Surg [16]  May place patient in observation at [facility] or [facility] if equivalent level of care is available:: No  Covid Evaluation: Asymptomatic - no recent exposure (last 10 days) testing not required  Diagnosis: Rhabdomyolysis [728.88.ICD-9-CM]  Admitting Physician: Evette Georges [[id number]]  Attending Physician: Westley Chandler [[id number]]          B Medical/Surgery History 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   Intentional drug overdose (HCC) 11/26/2021   Neuromuscular disorder (HCC)    neuropathy   Neuropathy    Pre-diabetes    Rhabdomyolysis 02/19/2017   Unilateral primary osteoarthritis, left knee 12/14/2021   Past Surgical History:  Procedure Laterality Date   FOOT  ARTHRODESIS Right 09/17/2019   Procedure: FUSION RIGHT LISFRANC JOINT;  Surgeon: Nadara Mustard, MD;  Location[facility] OR;  Service: Orthopedics;  Laterality: Right;   FOOT ARTHRODESIS Right 09/2019   HEMORROIDECTOMY     IR FLUORO GUIDE CV LINE RIGHT  02/22/2017   IR US GUIDE VASC ACCESS RIGHT  02/22/2017   TOTAL KNEE ARTHROPLASTY Left 12/14/2021   Procedure: LEFT TOTAL KNEE ARTHROPLASTY;  Surgeon: Nadara Mustard, MD;  Location: [facility] OR;  Service: Orthopedics;  Laterality: Left;   TUMOR REMOVAL     From Chest     A IV Location/Drains/Wounds Patient Lines/Drains/Airways Status     Active Line/Drains/Airways     Name Placement date Placement time Site Days   Peripheral IV 07/01/22 18 G Left Antecubital 07/01/22  1345  Antecubital  less than 1            Intake/Output Last 24 hours  Intake/Output Summary (Last 24 hours) at 07/01/2022 1816 Last data filed at 07/01/2022 1807 Gross per 24 hour  Intake 2111.76 ml  Output --  Net 2111.76 ml    Labs/Imaging Results for orders placed or performed during the hospital encounter of 07/01/22 (from the past 48 hour(s))  CBC with Differential     Status: Abnormal   Collection Time: 07/01/22 12:35 PM  Result Value Ref Range   WBC 10.5 4.0 - 10.5 K/uL   RBC 4.81 4.22 - 5.81 MIL/uL   Hemoglobin 14.6 13.0 - 17.0 g/dL  HCT 43.4 39.0 - 52.0 %   MCV 90.2 80.0 - 100.0 fL   MCH 30.4 26.0 - 34.0 pg   MCHC 33.6 30.0 - 36.0 g/dL   RDW 62.9 52.8 - 41.3 %   Platelets 193 150 - 400 K/uL   nRBC 0.0 0.0 - 0.2 %   Neutrophils Relative % 67 %   Neutro Abs 7.0 1.7 - 7.7 K/uL   Lymphocytes Relative 22 %   Lymphs Abs 2.3 0.7 - 4.0 K/uL   Monocytes Relative 11 %   Monocytes Absolute 1.1 (H) 0.1 - 1.0 K/uL   Eosinophils Relative 0 %   Eosinophils Absolute 0.0 0.0 - 0.5 K/uL   Basophils Relative 0 %   Basophils Absolute 0.0 0.0 - 0.1 K/uL   Immature Granulocytes 0 %   Abs Immature Granulocytes 0.02 0.00 - 0.07 K/uL    Comment: Performed at [facility] Lab, [address], [place], [place] [address]  Comprehensive metabolic panel     Status: Abnormal   Collection Time: 07/01/22 12:35 PM  Result Value Ref Range   Sodium 135 135 - 145 mmol/L   Potassium 3.7 3.5 - 5.1 mmol/L   Chloride 99 98 - 111 mmol/L   CO2 21 (L) 22 - 32 mmol/L   Glucose, Bld 93 70 - 99 mg/dL    Comment: Glucose reference range applies only to samples taken after fasting for at least 8 hours.   BUN 17 6 - 20 mg/dL   Creatinine, Ser 0.27 (H) 0.61 - 1.24 mg/dL   Calcium 9.5 8.9 - 25.3 mg/dL   Total Protein 7.8 6.5 - 8.1 g/dL   Albumin 4.4 3.5 - 5.0 g/dL   AST 664 (H) 15 - 41 U/L   ALT 203 (H) 0 - 44 U/L   Alkaline Phosphatase 88 38 - 126 U/L   Total Bilirubin 1.1 0.3 - 1.2 mg/dL   GFR, Estimated 54 (L) >60 mL/min    Comment: (NOTE) Calculated using the CKD-EPI Creatinine Equation (2021)    Anion gap 15 5 - 15    Comment: Performed at [facility], [address], [place], [place] [address]  CK     Status: Abnormal   Collection Time: 07/01/22 12:35 PM  Result Value Ref Range   Total CK 39,211 (H) 49 - 397 U/L    Comment: RESULT CONFIRMED BY MANUAL DILUTION Performed at [facility], [address], [place], [place] [address]   Urinalysis, w/ Reflex to Culture (Infection Suspected) -Urine, Clean Catch     Status: Abnormal   Collection Time: 07/01/22 12:40 PM  Result Value Ref Range   Specimen Source URINE, CLEAN CATCH    Color, Urine YELLOW YELLOW   APPearance HAZY (A) CLEAR   Specific Gravity, Urine 1.004 (L) 1.005 - 1.030   pH 6.0 5.0 - 8.0   Glucose, UA NEGATIVE NEGATIVE mg/dL   Hgb urine dipstick LARGE (A) NEGATIVE   Bilirubin Urine NEGATIVE NEGATIVE   Ketones, ur NEGATIVE NEGATIVE mg/dL   Protein, ur 30 (A) NEGATIVE mg/dL   Nitrite NEGATIVE NEGATIVE   Leukocytes,Ua MODERATE (A) NEGATIVE   RBC / HPF 0-5 0 - 5 RBC/hpf   WBC, UA >50 0 - 5 WBC/hpf    Comment:        Reflex urine culture not performed if WBC <=10, OR if Squamous  epithelial cells >5. If Squamous epithelial cells >5 suggest recollection.    Bacteria, UA RARE (A) NONE SEEN  Squamous Epithelial / HPF 0-5 0 - 5 /HPF   Mucus PRESENT     Comment: Performed at [facility], [address], [place], [place] [address]  Urine Culture     Status: None (Preliminary result)   Collection Time: 07/01/22 12:40 PM   Specimen: Urine, Random  Result Value Ref Range   Specimen Description URINE, RANDOM    Special Requests      URINE, CLEAN CATCH Performed at [facility], [address], [place], [place] [address]    Culture PENDING    Report Status PENDING   Acetaminophen level     Status: Abnormal   Collection Time: 07/01/22  2:47 PM  Result Value Ref Range   Acetaminophen (Tylenol), Serum <10 (L) 10 - 30 ug/mL    Comment: (NOTE) Therapeutic concentrations vary significantly. A range of 10-30 ug/mL  may be an effective concentration for many patients. However, some  are best treated at concentrations outside of this range. Acetaminophen concentrations >150 ug/mL at 4 hours after ingestion  and >50 ug/mL at 12 hours after ingestion are often associated with  toxic reactions.  Performed at [facility], [address] [place], [place] [address]   Hepatitis panel, acute     Status: None (Preliminary result)   Collection Time: 07/01/22  2:47 PM  Result Value Ref Range   Hepatitis B Surface Ag PENDING NON REACTIVE   HCV Ab NON REACTIVE NON REACTIVE    Comment: (NOTE) Nonreactive HCV antibody screen is consistent with no HCV infections,  unless recent infection is suspected or other evidence exists to indicate HCV infection.     Hep A IgM NON REACTIVE NON REACTIVE   Hep B C IgM NON REACTIVE NON REACTIVE    Comment: Performed at [facility], [address] [place], [place] [address]   US Abdomen Limited RUQ (LIVER/GB)  Result Date: 07/01/2022 CLINICAL DATA:  Elevated liver function tests EXAM: ULTRASOUND ABDOMEN LIMITED  RIGHT UPPER QUADRANT COMPARISON:  None Available. FINDINGS: Gallbladder: No gallstones or wall thickening visualized. No sonographic Murphy sign noted by sonographer. Common bile duct: Diameter: 3 mm Liver: No focal lesion identified. Within normal limits in parenchymal echogenicity. Portal vein is patent on color Doppler imaging with normal direction of blood flow towards the liver. Other: None. IMPRESSION: Normal right upper quadrant ultrasound examination. Electronically Signed   By: Agustin Cree M.D.   On: 07/01/2022 15:33    Pending Labs Unresulted Labs (From admission, onward)     Start     Ordered   07/02/22 0500  CK  Tomorrow morning,   R        07/01/22 1748   07/02/22 0500  Comprehensive metabolic panel  Tomorrow morning,   R        07/01/22 1748   07/01/22 2300  Comprehensive metabolic panel  Once,   STAT        07/01/22 1748   07/01/22 1508  Rapid urine drug screen (hospital performed)  ONCE - STAT,   STAT        07/01/22 1507            Vitals/Pain Today's Vitals   07/01/22 1202 07/01/22 1203 07/01/22 1235 07/01/22 1538  BP:  122/82  126/84  Pulse:  78  77  Resp:  16  16  Temp:  98.1 F (36.7 C)  98 F (36.7 C)  TempSrc:  Oral  Oral  SpO2:  100%  100%  PainSc: 0-No pain  0-No pain     Isolation Precautions No active isolations  Medications Medications  lactated ringers infusion ( Intravenous Infusion Verify 07/01/22 1807)  nicotine (NICODERM CQ - dosed in mg/24 hours) patch 14 mg (has no administration in time range)  aspirin EC tablet 81 mg (has no administration in time range)  bictegravir-emtricitabine-tenofovir AF (BIKTARVY) 50-200-25 MG per tablet 1 tablet (has no administration in time range)  rosuvastatin (CRESTOR) tablet 20 mg (has no administration in time range)  hydrOXYzine (ATARAX) tablet 25 mg (has no administration in time range)  QUEtiapine (SEROQUEL) tablet 300 mg (has no administration in time range)  traZODone (DESYREL) tablet 50 mg (has no  administration in time range)  pantoprazole (PROTONIX) EC tablet 40 mg (has no administration in time range)  gabapentin (NEURONTIN) capsule 600 mg (has no administration in time range)  methocarbamol (ROBAXIN) tablet 500 mg (has no administration in time range)  enoxaparin (LOVENOX) injection 40 mg (has no administration in time range)  lactated ringers bolus 2,000 mL (0 mLs Intravenous Stopped 07/01/22 1806)    Mobility walks     Focused Assessments Renal Assessment Handoff:  Hx of rhabdo, on temporary dialysis in 2019 for same. Not currently on dialysis.    R Recommendations: See Admitting Provider Note  Report given to:   Additional Notes: a/o, ambulatory

## 2022-07-01 NOTE — Assessment & Plan Note (Signed)
Endorses continued cocaine, marijuana, nicotine, alcohol use.  - TOC consult for substance use - Nicotine patch 14 mg

## 2022-07-01 NOTE — Assessment & Plan Note (Addendum)
CK over 39,000 > **.  Elevated LFTs likely from muscle breakdown. Creatinine 1.5, close to baseline of 1.4.  Likely in setting of recent cocaine use and possibly exacerbated by dehydration  - Continue maintenance fluids to 250 mL/h -A.m. CK -Tylenol 650 mg every 6 as needed for pain -Strict I/O's

## 2022-07-01 NOTE — ED Triage Notes (Signed)
Patient reports having one episode of dark colored urine today and is concerned for dehydration d/t previous history of rhabdo. Denies pain and reports normal PO intake.

## 2022-07-01 NOTE — Progress Notes (Signed)
     Daily Progress Note Intern Pager: [phone]  Patient name: Dylan [name] Medical record number: [id number] Date of birth: 1965-11-22 Age: 57 y.o. Gender: male  Primary Care Provider: Hoy Register, MD Consultants: None Code Status: Full  Pt Overview and Major Events to Date:  6/22- Admitted   Assessment and Plan: Dylan [name] is a 57 y.o. male presenting with rhabdomyolysis. PMH significant for nontraumatic rhabdo, cocaine use, alcohol use, NSTEMI, HLD, bipolar 1 disorder schizoaffective    Hospital Problem List      Hospital     * (Principal) Rhabdomyolysis     CK initially 39,000 now 23,000.  Elevated LFTs likely from muscle  breakdown. Creatinine 1.5, close to baseline of 1.4.  Likely in setting of  recent cocaine use and possibly exacerbated by dehydration  - Continue maintenance fluids to 250 mL/h -A.m. CK -Tylenol 650 mg every 6 as needed for pain -Strict I/O's        HIV (human immunodeficiency virus infection) (HCC)     Continue HAART        Elevated creatining on likely CKD     Creatinine close to baseline as above.  Likely due to elevated CK. Will  monitor renal function closely given need of dialysis in the past. - Continue fluids as above - AM CMP        Chronic 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 coma (HCC)     Polysubstance abuse (HCC)     Endorses continued cocaine, marijuana, nicotine, alcohol use.  - TOC consult for substance use - Nicotine patch 14 mg       FEN/GI: regular diet  PPx: Lovenox  Dispo:Home pending clinical improvement . Barriers include current Rhabdo   Subjective:  Patient sleeping when   Objective: Temp:  [97.8 F (36.6 C)-98.7 F (37.1 C)] 97.8 F (36.6 C) (06/23 0627) Pulse Rate:  [58-87] 58 (06/23 0627) Resp:  [14-18] 14 (06/23 0627) BP: (93-126)/(63-84) 93/63 (06/23 0627) SpO2:  [97 %-100 %] 97 % (06/23 1308) Physical Exam: General: sleeping in bed, NAD Cardiovascular: RRR no murmurs   Respiratory: CTAB normal WOB  Abdomen: soft, non distended  Extremities: warm, dry. Moves spontaneously   Laboratory: Most recent CBC Lab Results  Component Value Date   WBC 10.5 07/01/2022   HGB 14.6 07/01/2022   HCT 43.4 07/01/2022   MCV 90.2 07/01/2022   PLT 193 07/01/2022   Most recent BMP    Latest Ref Rng & Units 07/02/2022    3:50 AM  BMP  Glucose 70 - 99 mg/dL 657   BUN 6 - 20 mg/dL 14   Creatinine 8.46 - 1.24 mg/dL 9.62   Sodium 952 - 841 mmol/L 137   Potassium 3.5 - 5.1 mmol/L 3.6   Chloride 98 - 111 mmol/L 105   CO2 22 - 32 mmol/L 24   Calcium 8.9 - 10.3 mg/dL 9.0     Imaging/Diagnostic Tests:  US Abdomen Limited RUQ (LIVER/GB) Normal right upper quadrant ultrasound examination.    Cora Collum, DO 07/02/2022, 7:19 AM  PGY-3, [facility] FPTS Intern pager: [phone], text pages welcome Secure chat group [facility] Teaching Service

## 2022-07-01 NOTE — Assessment & Plan Note (Signed)
Creatinine close to baseline as above.  Likely due to overwhelming levels of CK.  Will pay close attention to renal function given need of dialysis in the past. - Continue fluids as above - CMP at 11 PM, CMP at 5 AM

## 2022-07-01 NOTE — ED Provider Notes (Signed)
Patient taken in signout at shift change from PA Henderly.  Is a 57 year old male with a past medical history of nontraumatic rhabdomyolysis requiring dialysis and 1 year ago.  He presents today for diffuse muscle pain and dark urine.  He states that he thinks he has rhabdo again.  History of polysubstance abuse including cocaine and methamphetamine use.  He is positive for rhabdomyolysis today with a CK of 39,000-11.  He is receiving fluids including 2 L bolus and now on maintenance fluids.  Patient's LFTs are elevated and I was instructed to follow-up on a right upper quadrant ultrasound however he is not specifically having right upper quadrant pain and I do not think that it is necessary to wait for an admission while waiting for ultrasound results as elevated liver and liver enzymes are part of the enzymes released in muscle breakdown. Consult ordered  Case discussed with family resident service   Arthor Captain, PA-C 07/02/22 1141    Lonell Grandchild, MD 07/02/22 [phone]

## 2022-07-01 NOTE — Assessment & Plan Note (Signed)
Creatinine close to baseline as above.  Likely due to elevated CK. Will monitor renal function closely given need of dialysis in the past. - Continue fluids as above - AM CMP 

## 2022-07-02 DIAGNOSIS — Z21 Asymptomatic human immunodeficiency virus [HIV] infection status: Secondary | ICD-10-CM | POA: Diagnosis present

## 2022-07-02 DIAGNOSIS — F1010 Alcohol abuse, uncomplicated: Secondary | ICD-10-CM | POA: Diagnosis present

## 2022-07-02 DIAGNOSIS — E860 Dehydration: Secondary | ICD-10-CM | POA: Diagnosis present

## 2022-07-02 DIAGNOSIS — F1510 Other stimulant abuse, uncomplicated: Secondary | ICD-10-CM | POA: Diagnosis present

## 2022-07-02 DIAGNOSIS — B181 Chronic viral hepatitis B without delta-agent: Secondary | ICD-10-CM | POA: Diagnosis present

## 2022-07-02 DIAGNOSIS — Z91040 Latex allergy status: Secondary | ICD-10-CM | POA: Diagnosis not present

## 2022-07-02 DIAGNOSIS — F411 Generalized anxiety disorder: Secondary | ICD-10-CM | POA: Diagnosis present

## 2022-07-02 DIAGNOSIS — F1410 Cocaine abuse, uncomplicated: Secondary | ICD-10-CM | POA: Diagnosis present

## 2022-07-02 DIAGNOSIS — Z87891 Personal history of nicotine dependence: Secondary | ICD-10-CM | POA: Diagnosis not present

## 2022-07-02 DIAGNOSIS — G709 Myoneural disorder, unspecified: Secondary | ICD-10-CM | POA: Diagnosis present

## 2022-07-02 DIAGNOSIS — K760 Fatty (change of) liver, not elsewhere classified: Secondary | ICD-10-CM | POA: Diagnosis present

## 2022-07-02 DIAGNOSIS — G8929 Other chronic pain: Secondary | ICD-10-CM | POA: Diagnosis present

## 2022-07-02 DIAGNOSIS — R319 Hematuria, unspecified: Secondary | ICD-10-CM | POA: Diagnosis present

## 2022-07-02 DIAGNOSIS — M6282 Rhabdomyolysis: Secondary | ICD-10-CM | POA: Diagnosis present

## 2022-07-02 DIAGNOSIS — F319 Bipolar disorder, unspecified: Secondary | ICD-10-CM | POA: Diagnosis present

## 2022-07-02 DIAGNOSIS — Z96652 Presence of left artificial knee joint: Secondary | ICD-10-CM | POA: Diagnosis present

## 2022-07-02 DIAGNOSIS — Z7982 Long term (current) use of aspirin: Secondary | ICD-10-CM | POA: Diagnosis not present

## 2022-07-02 DIAGNOSIS — Z880 Allergy status to penicillin: Secondary | ICD-10-CM | POA: Diagnosis not present

## 2022-07-02 DIAGNOSIS — Z91014 Allergy to mammalian meats: Secondary | ICD-10-CM | POA: Diagnosis not present

## 2022-07-02 DIAGNOSIS — E785 Hyperlipidemia, unspecified: Secondary | ICD-10-CM | POA: Diagnosis present

## 2022-07-02 DIAGNOSIS — R821 Myoglobinuria: Secondary | ICD-10-CM | POA: Diagnosis present

## 2022-07-02 DIAGNOSIS — G4700 Insomnia, unspecified: Secondary | ICD-10-CM | POA: Diagnosis present

## 2022-07-02 DIAGNOSIS — Z79899 Other long term (current) drug therapy: Secondary | ICD-10-CM | POA: Diagnosis not present

## 2022-07-02 DIAGNOSIS — Z91048 Other nonmedicinal substance allergy status: Secondary | ICD-10-CM | POA: Diagnosis not present

## 2022-07-02 LAB — COMPREHENSIVE METABOLIC PANEL
ALT: 180 U/L — ABNORMAL HIGH (ref 0–44)
AST: 526 U/L — ABNORMAL HIGH (ref 15–41)
Albumin: 3.2 g/dL — ABNORMAL LOW (ref 3.5–5.0)
Alkaline Phosphatase: 74 U/L (ref 38–126)
Anion gap: 8 (ref 5–15)
BUN: 14 mg/dL (ref 6–20)
CO2: 24 mmol/L (ref 22–32)
Calcium: 9 mg/dL (ref 8.9–10.3)
Chloride: 105 mmol/L (ref 98–111)
Creatinine, Ser: 1.24 mg/dL (ref 0.61–1.24)
GFR, Estimated: >60 mL/min (ref >60)
Glucose, Bld: 109 mg/dL — ABNORMAL HIGH (ref 70–99)
Potassium: 3.6 mmol/L (ref 3.5–5.1)
Sodium: 137 mmol/L (ref 135–145)
Total Bilirubin: 0.8 mg/dL (ref 0.3–1.2)
Total Protein: 5.9 g/dL — ABNORMAL LOW (ref 6.5–8.1)

## 2022-07-02 LAB — CK: Total CK: 23430 U/L — ABNORMAL HIGH (ref 49–397)

## 2022-07-02 LAB — URINE CULTURE: Culture: NO GROWTH

## 2022-07-02 MED ORDER — QUETIAPINE FUMARATE 25 MG PO TABS
200.0000 mg | ORAL_TABLET | Freq: Every day | ORAL | Status: DC
  Administered 2022-07-02 – 2022-07-03 (×2): 200 mg via ORAL
  Filled 2022-07-02 (×2): qty 8

## 2022-07-02 NOTE — Hospital Course (Signed)
Dylan [name] is a 57 y.o.male with a history of nontraumatic rhabdomyolysis, chronic hepatitis B, HIV, bipolar 1 disorder, alcohol use disorder, NSTEMI, cocaine use, anxiety, prediabetes, PTSD, tobacco use who was admitted to the [facility] Teaching Service at [facility] for rhabdomyolysis. His hospital course is detailed below:  Rhabdomyolysis Patient presented to [name], ED in the setting of cocaine use, dehydration and notable dark urine after participating in future festival.  His vitals are stable however he had elevated CK to 39,000 with corresponding LFT elevation of AST/ALT 706/203 and UA which reflected myoglobinuria.  He received several boluses of LR and started on aggressive IV fluids.  CK and LFTs downtrending to***appropriately.  Given multiple instances of rhabdo, his statin medication was held.  Other chronic conditions were medically managed with home medications and formulary alternatives as necessary (insomnia, bipolar 1 disorder, GERD, chronic viral hepatitis B)  PCP Follow-up Recommendations:

## 2022-07-02 NOTE — Plan of Care (Signed)
Patient AOX4, VSS throughout shift.  All meds given on time as ordered.  PRN atarax, robaxin and trazodone given last night.  Continuous fluids on-going.  Pt up at lib to bathroom.  Pt took shower last night.  POC maintained, will continue to monitor.  Problem: Education: Goal: Knowledge of General Education information will improve Description: Including pain rating scale, medication(s)/side effects and non-pharmacologic comfort measures Outcome: Progressing   Problem: Health Behavior/Discharge Planning: Goal: Ability to manage health-related needs will improve Outcome: Progressing   Problem: Clinical Measurements: Goal: Ability to maintain clinical measurements within normal limits will improve Outcome: Progressing Goal: Will remain free from infection Outcome: Progressing Goal: Diagnostic test results will improve Outcome: Progressing Goal: Respiratory complications will improve Outcome: Progressing Goal: Cardiovascular complication will be avoided Outcome: Progressing   Problem: Activity: Goal: Risk for activity intolerance will decrease Outcome: Progressing   Problem: Nutrition: Goal: Adequate nutrition will be maintained Outcome: Progressing   Problem: Coping: Goal: Level of anxiety will decrease Outcome: Progressing   Problem: Elimination: Goal: Will not experience complications related to bowel motility Outcome: Progressing Goal: Will not experience complications related to urinary retention Outcome: Progressing   Problem: Pain Managment: Goal: General experience of comfort will improve Outcome: Progressing   Problem: Safety: Goal: Ability to remain free from injury will improve Outcome: Progressing   Problem: Skin Integrity: Goal: Risk for impaired skin integrity will decrease Outcome: Progressing

## 2022-07-03 ENCOUNTER — Ambulatory Visit (HOSPITAL_COMMUNITY): Payer: Medicaid Other | Admitting: Clinical

## 2022-07-03 DIAGNOSIS — M6282 Rhabdomyolysis: Secondary | ICD-10-CM | POA: Diagnosis not present

## 2022-07-03 LAB — COMPREHENSIVE METABOLIC PANEL
ALT: 145 U/L — ABNORMAL HIGH (ref 0–44)
AST: 292 U/L — ABNORMAL HIGH (ref 15–41)
Albumin: 2.8 g/dL — ABNORMAL LOW (ref 3.5–5.0)
Alkaline Phosphatase: 67 U/L (ref 38–126)
Anion gap: 7 (ref 5–15)
BUN: 9 mg/dL (ref 6–20)
CO2: 26 mmol/L (ref 22–32)
Calcium: 8.7 mg/dL — ABNORMAL LOW (ref 8.9–10.3)
Chloride: 107 mmol/L (ref 98–111)
Creatinine, Ser: 1.07 mg/dL (ref 0.61–1.24)
GFR, Estimated: >60 mL/min (ref >60)
Glucose, Bld: 122 mg/dL — ABNORMAL HIGH (ref 70–99)
Potassium: 3.7 mmol/L (ref 3.5–5.1)
Sodium: 140 mmol/L (ref 135–145)
Total Bilirubin: 0.4 mg/dL (ref 0.3–1.2)
Total Protein: 5.3 g/dL — ABNORMAL LOW (ref 6.5–8.1)

## 2022-07-03 LAB — CK: Total CK: 11164 U/L — ABNORMAL HIGH (ref 49–397)

## 2022-07-03 NOTE — Plan of Care (Signed)

## 2022-07-03 NOTE — Plan of Care (Signed)
Patient Dylan [name], VSS throughout shift. All meds given on time as ordered. PRN atarax and trazodone given last night. Continuous fluids on-going. Pt up at lib to bathroom. POC maintained, will continue to monitor.  Problem: Education: Goal: Knowledge of General Education information will improve Description: Including pain rating scale, medication(s)/side effects and non-pharmacologic comfort measures Outcome: Progressing   Problem: Health Behavior/Discharge Planning: Goal: Ability to manage health-related needs will improve Outcome: Progressing   Problem: Clinical Measurements: Goal: Ability to maintain clinical measurements within normal limits will improve Outcome: Progressing Goal: Will remain free from infection Outcome: Progressing Goal: Diagnostic test results will improve Outcome: Progressing Goal: Respiratory complications will improve Outcome: Progressing Goal: Cardiovascular complication will be avoided Outcome: Progressing   Problem: Activity: Goal: Risk for activity intolerance will decrease Outcome: Progressing   Problem: Nutrition: Goal: Adequate nutrition will be maintained Outcome: Progressing   Problem: Coping: Goal: Level of anxiety will decrease Outcome: Progressing   Problem: Elimination: Goal: Will not experience complications related to bowel motility Outcome: Progressing Goal: Will not experience complications related to urinary retention Outcome: Progressing   Problem: Pain Managment: Goal: General experience of comfort will improve Outcome: Progressing   Problem: Safety: Goal: Ability to remain free from injury will improve Outcome: Progressing   Problem: Skin Integrity: Goal: Risk for impaired skin integrity will decrease Outcome: Progressing

## 2022-07-03 NOTE — Assessment & Plan Note (Signed)
Endorses continued cocaine, marijuana, nicotine, alcohol use.  - TOC consult for substance use - Nicotine patch 14 mg  

## 2022-07-03 NOTE — TOC Initial Note (Addendum)
Transition of Care [facility]) - Initial/Assessment Note    Patient Details  Name: Dylan [name] MRN: [id number] Date of Birth: Jul 05, 1965  [facility] CM/SW Contact:    Harriet Masson, RN Phone Number: 07/03/2022, 12:06 PM  Clinical Narrative:                 Spoke to patient regarding transition needs.  Patient lives alone and uses Medicaid transportation to get to apts. Declines SA resources.  Spoke to [facility] who stated they will pick up patient when medically stable for discharge.  TOC will need to call [phone] ext [phone]. Address, Phone number and PCP verified. TOC following.   Expected Discharge Plan: Home/Self Care Barriers to Discharge: Continued Medical Work up   Patient Goals and CMS Choice Patient states their goals for this hospitalization and ongoing recovery are:: return home          Expected Discharge Plan and Services   Discharge Planning Services: CM Consult   Living arrangements for the past 2 months: Single Family Home                                      Prior Living Arrangements/Services Living arrangements for the past 2 months: Single Family Home Lives with:: Self Patient language and need for interpreter reviewed:: Yes Do you feel safe going back to the place where you live?: Yes      Need for Family Participation in Patient Care: Yes (Comment) Care giver support system in place?: Yes (comment)   Criminal Activity/Legal Involvement Pertinent to Current Situation/Hospitalization: No - Comment as needed  Activities of Daily Living Home Assistive Devices/Equipment: None ADL Screening (condition at time of admission) Patient's cognitive ability adequate to safely complete daily activities?: Yes Is the patient deaf or have difficulty hearing?: No Does the patient have difficulty seeing, even when wearing glasses/contacts?: No Does the patient have difficulty concentrating, remembering, or making decisions?: No Patient able  to express need for assistance with ADLs?: Yes Does the patient have difficulty dressing or bathing?: No Independently performs ADLs?: Yes (appropriate for developmental age) Does the patient have difficulty walking or climbing stairs?: No Weakness of Legs: None Weakness of Arms/Hands: None  Permission Sought/Granted                  Emotional Assessment Appearance:: Appears younger than stated age Attitude/Demeanor/Rapport: Engaged Affect (typically observed): Accepting Orientation: : Oriented to Self, Oriented to Place, Oriented to  Time, Oriented to Situation Alcohol / Substance Use: Alcohol Use Psych Involvement: No (comment)  Admission diagnosis:  Rhabdomyolysis [M62.82] Elevated LFTs [R79.89] Non-traumatic rhabdomyolysis [M62.82] Hematuria, unspecified type [R31.9] Polysubstance use disorder [F19.90] Patient Active Problem List   Diagnosis Date Noted   Rhabdomyolysis 07/01/2022   Non-ST elevated myocardial infarction (HCC) 06/03/2022   Chest pain 06/02/2022   Leukocytosis 06/02/2022   Dyslipidemia 06/02/2022   Polysubstance abuse (HCC) 06/02/2022   Obesity (BMI 30-39.9) 06/02/2022   Alcohol use disorder 04/12/2022   PTSD (post-traumatic stress disorder) 04/12/2022   S/P total knee arthroplasty, left 12/14/2021   Total knee replacement status, left 12/14/2021   Chronic fatigue 11/16/2021   Abnormal EKG 11/16/2021   Overweight (BMI 25.0-29.9) 11/16/2021   Elevated LFTs 07/03/2021   Right foot ulcer (HCC) 07/03/2021   Onychomycosis 04/19/2021   Cocaine use disorder (HCC) 03/16/2021   Bipolar I disorder, most recent episode depressed (HCC) 03/15/2021  Bipolar 1 disorder, depressed (HCC) 03/15/2021   Nodular radiologic density 02/02/2021   Lesion of liver 02/02/2021   Liver disease 02/02/2021   Vitamin D deficiency 01/18/2021   Methamphetamine abuse (HCC) 01/17/2021   Lesion of pancreas 01/17/2021   Substance induced mood disorder (HCC) 10/26/2020   Fatty  liver 03/09/2020   Generalized anxiety disorder 02/03/2020   Lisfranc dislocation, right, initial encounter    Prediabetes 07/22/2019   Tobacco abuse 07/10/2018   Acute renal failure (ARF) (HCC)    Suicidal ideations    Chronic 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 coma (HCC)    Acute hepatitis 02/19/2017   Degenerative disc disease 08/21/2011   HIV (human immunodeficiency virus infection) (HCC) 08/17/2011   PCP:  Hoy Register, MD Pharmacy:   [facility] [phone] - [place], [place] - [address] [facility] [address] [place] [address] Phone: [phone] Fax: [phone]     Social Determinants of Health (SDOH) Social History: SDOH Screenings   Food Insecurity: No Food Insecurity (07/01/2022)  Housing: Low Risk  (07/01/2022)  Transportation Needs: No Transportation Needs (07/01/2022)  Utilities: Not At Risk (03/31/2022)  Alcohol Screen: Medium Risk (03/31/2022)  Depression (PHQ2-9): Medium Risk (01/04/2022)  Tobacco Use: Medium Risk (07/01/2022)   SDOH Interventions: Transportation Interventions: Taxi Voucher Given   Readmission Risk Interventions     No data to display

## 2022-07-03 NOTE — Assessment & Plan Note (Addendum)
CK downtrending, 11,164 from 23,430 yesterday. LFTs elevated but improving. Cr at baseline. Likely in setting of recent cocaine use and possibly exacerbated by dehydration. ?underlying neuromuscular disease. Discontinued home statin. - Continue maintenance fluids 250 mL/h -A.m. CK, CMP -Strict I/O's -can give low dose oxy for pain prn

## 2022-07-03 NOTE — Assessment & Plan Note (Signed)
Creatinine close to baseline as above.  Likely due to elevated CK. Will monitor renal function closely given need of dialysis in the past. - Continue fluids as above - AM CMP

## 2022-07-03 NOTE — Progress Notes (Signed)
     Daily Progress Note Intern Pager: [phone]  Patient name: Dylan [name] Medical record number: [id number] Date of birth: [birth year] Age: 57 y.o. Gender: male  Primary Care Provider: Hoy Register, MD Consultants: none Code Status: full  Pt Overview and Major Events to Date:  6/22-admitted  Assessment and Plan:  Dylan [name] is a 57 y.o. male presenting with rhabdomyolysis. PMH significant for nontraumatic rhabdo, cocaine use, alcohol use, NSTEMI, HLD, bipolar 1 disorder schizoaffective   Hospital Problem List      Hospital     * (Principal) Rhabdomyolysis     CK downtrending, 11,164 from 23,430 yesterday. LFTs elevated but  improving. Cr at baseline. Likely in setting of recent cocaine use and possibly exacerbated by  dehydration. ?underlying neuromuscular disease. Discontinued home statin. - Continue maintenance fluids 250 mL/h -A.m. CK, CMP -Strict I/O's -can give low dose oxy for pain prn        HIV (human immunodeficiency virus infection) (HCC)     Continue HAART        Chronic 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 coma (HCC)     Polysubstance abuse (HCC)     Endorses continued cocaine, marijuana, nicotine, alcohol use.  - TOC consult for substance use - Nicotine patch 14 mg        FEN/GI: Reg diet PPx: lovenox Dispo:Home pending clinical improvement . Barriers include IV fluids for rhabdo.   Subjective:  NAEON, denies concerns this morning.  Pain improved overall but still has some soreness in buttocks and legs  Objective: Temp:  [97.6 F (36.4 C)-98.1 F (36.7 C)] 97.8 F (36.6 C) (06/24 0829) Pulse Rate:  [66-88] 88 (06/24 0829) Resp:  [14-18] 18 (06/24 0829) BP: (104-132)/(70-75) 132/73 (06/24 0829) SpO2:  [98 %-100 %] 100 % (06/24 0829) Physical Exam: General: NAD, resting in bed comfortably Cardiovascular: RRR no murmurs Respiratory: CTAB normal WOB on RA Abdomen: Soft NT/ND Extremities: Sore with squeezing of lower  extremities.  No significant edema  Laboratory: Most recent CBC Lab Results  Component Value Date   WBC 10.5 07/01/2022   HGB 14.6 07/01/2022   HCT 43.4 07/01/2022   MCV 90.2 07/01/2022   PLT 193 07/01/2022   Most recent BMP    Latest Ref Rng & Units 07/03/2022    3:39 AM  BMP  Glucose 70 - 99 mg/dL 147   BUN 6 - 20 mg/dL 9   Creatinine 8.29 - 5.62 mg/dL 1.30   Sodium 865 - 784 mmol/L 140   Potassium 3.5 - 5.1 mmol/L 3.7   Chloride 98 - 111 mmol/L 107   CO2 22 - 32 mmol/L 26   Calcium 8.9 - 10.3 mg/dL 8.7     CK 69,629   Vonna Drafts, MD 07/03/2022, 11:52 AM  PGY-1, [facility] FPTS Intern pager: [phone], text pages welcome Secure chat group [facility] Teaching Service

## 2022-07-04 DIAGNOSIS — M6282 Rhabdomyolysis: Secondary | ICD-10-CM | POA: Diagnosis not present

## 2022-07-04 LAB — COMPREHENSIVE METABOLIC PANEL
ALT: 143 U/L — ABNORMAL HIGH (ref 0–44)
AST: 216 U/L — ABNORMAL HIGH (ref 15–41)
Albumin: 3.2 g/dL — ABNORMAL LOW (ref 3.5–5.0)
Alkaline Phosphatase: 66 U/L (ref 38–126)
Anion gap: 9 (ref 5–15)
BUN: 9 mg/dL (ref 6–20)
CO2: 27 mmol/L (ref 22–32)
Calcium: 9.3 mg/dL (ref 8.9–10.3)
Chloride: 102 mmol/L (ref 98–111)
Creatinine, Ser: 0.95 mg/dL (ref 0.61–1.24)
GFR, Estimated: >60 mL/min (ref >60)
Glucose, Bld: 103 mg/dL — ABNORMAL HIGH (ref 70–99)
Potassium: 3.7 mmol/L (ref 3.5–5.1)
Sodium: 138 mmol/L (ref 135–145)
Total Bilirubin: 0.6 mg/dL (ref 0.3–1.2)
Total Protein: 6.1 g/dL — ABNORMAL LOW (ref 6.5–8.1)

## 2022-07-04 LAB — CK: Total CK: 6819 U/L — ABNORMAL HIGH (ref 49–397)

## 2022-07-04 NOTE — Plan of Care (Signed)
Problem: Education: Goal: Knowledge of General Education information will improve Description: Including pain rating scale, medication(s)/side effects and non-pharmacologic comfort measures Outcome: Progressing Pt understands he was admitted for rhabdomyolysis.   Problem: Clinical Measurements: Goal: Ability to maintain clinical measurements within normal limits will improve Outcome: Progressing Electrolytes are improving per lab results.   Problem: Clinical Measurements: Goal: Will remain free from infection Outcome: Progressing S/Sx of infection monitored and assessed q-shift.  Pt has remained afebrile thus far.     Problem: Clinical Measurements: Goal: Respiratory complications will improve Outcome: Progressing Respiratory status monitored and assessed q-shift.  Pt is on room air with O2 saturation at 98% and respiration rate of 18 breaths per minure.  Pt has not endorsed c/o SOB or DOE.     Problem: Nutrition: Goal: Adequate nutrition will be maintained Outcome: Progressing Pt is on a regular diet per MD's orders and is tolerating it well w/o s/sx of n/v, abdominal pain and distention.  Problem: Elimination: Goal: Will not experience complications related to bowel motility Outcome: Progressing Pt has not express c/o constipation. LMB was on 07/02/2022.    Problem: Elimination: Goal: Will not experience complications related to urinary retention Outcome: Progressing Pt voiding w/o difficulty and endorsing abdominal pain and distention.   Problem: Pain Managment: Goal: General experience of comfort will improve Outcome: Progressing Pt has denied pain thus far.     Problem: Safety: Goal: Ability to remain free from injury will improve Outcome: Progressing Pt has remained free from falls thus far.  Instructed pt and family to utilize RN call light for assistance.  Hourly rounds performed.  Bed in lowest position, locked with two upper side rails engaged.  Belongings and  call light within reach.   Problem: Skin Integrity: Goal: Risk for impaired skin integrity will decrease Outcome: Progressing Skin integrity monitored and assessed q-shift. Instructed pt to turn/ reposition himself q2 hours to prevent further skin impairment. Tubes and drains assessed for device related pressure sores. Pt is continent of both his bowel and bladder.

## 2022-07-04 NOTE — Discharge Summary (Signed)
Family Medicine Teaching [facility] Discharge Summary  Patient name: Dylan [name] Medical record number: [id number] Date of birth: 11-Jan-1965 Age: 57 y.o. Gender: male Date of Admission: 07/01/2022  Date of Discharge: 07/04/22 Admitting Physician: Evette Georges, MD  Primary Care Provider: Hoy Register, MD Consultants: none  Indication for Hospitalization: rhabdomyolysis  Discharge Diagnoses/Problem List:  Principal Problem for Admission: rhabdomyolysis Other Problems addressed during stay:  Principal Problem:   Rhabdomyolysis Active Problems:   Chronic 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 coma (HCC)   HIV (human immunodeficiency virus infection) (HCC)   Polysubstance abuse [facility])    Brief Hospital Course:  [name] is a 57 y.o.male with a history of nontraumatic rhabdomyolysis, chronic hepatitis B, HIV, bipolar 1 disorder, alcohol use disorder, NSTEMI, cocaine use, anxiety, prediabetes, PTSD, tobacco use who was admitted to the [facility] Teaching Service at [name] for rhabdomyolysis. His hospital course is detailed below:  Rhabdomyolysis Patient presented to [name], ED in the setting of cocaine use, dehydration and notable dark urine after participating in food truck festival.  His vitals are stable however he had elevated CK to 39,000 with corresponding LFT elevation of AST/ALT 706/203 and UA which reflected myoglobinuria.  He received several boluses of LR and started on aggressive IV fluids.  CK and LFTs trended down appropriately by discharge. CK 6819, AST/ALT 216/143 upon discharge. Given multiple instances of rhabdo, his statin medication was held. Additionally, there is some concern with the possibility of a neuromuscular disorder as he has had repeat instance of rhabdo.    Other chronic conditions were medically managed with home medications and formulary alternatives as necessary (insomnia, bipolar 1 disorder, GERD, chronic viral hepatitis B,  tobacco use-nicotine patch)  PCP Follow-up Recommendations:  Follow up recommended with neuromuscular specialist - consider referral Encourage discontinuation of illicit drugs  Check CK and CMP on follow up Recommend referral to lipid clinic due to statin intolerance  Disposition: home  Discharge Condition: stable, improved   Discharge Exam:  Vitals:   07/04/22 0658 07/04/22 0715  BP: (!) 128/92 (!) 121/91  Pulse: 68 66  Resp: 16 18  Temp: 98.4 F (36.9 C) (!) 97.5 F (36.4 C)  SpO2: 100% 98%   Gen: NAD CV: RRR Pulm: CTAB on RA Abd: Soft NTND Ext: moves all extremities, nontender  Significant Procedures: na  Significant Labs and Imaging:  No results for input(s): "WBC", "HGB", "HCT", "PLT" in the last 48 hours. Recent Labs  Lab 07/03/22 0339 07/04/22 0414  NA 140 138  K 3.7 3.7  CL 107 102  CO2 26 27  GLUCOSE 122* 103*  BUN 9 9  CREATININE 1.07 0.95  CALCIUM 8.7* 9.3  ALKPHOS 67 66  AST 292* 216*  ALT 145* 143*  ALBUMIN 2.8* 3.2*     Results/Tests Pending at Time of Discharge: n/a  Discharge Medications:  Allergies as of 07/04/2022       Reactions   Penicillins Other (See Comments)   Childhood allergy   Pork-derived Products Other (See Comments)   Patient preference   Latex Rash   Tape Rash        Medication List     STOP taking these medications    ibuprofen 800 MG tablet Commonly known as: ADVIL   methocarbamol 500 MG tablet Commonly known as: ROBAXIN   rosuvastatin 20 MG tablet Commonly known as: CRESTOR       TAKE these medications    aspirin EC 81 MG tablet Take 1  tablet (81 mg total) by mouth daily. Swallow whole.   Biktarvy 50-200-25 MG Tabs tablet Generic drug: bictegravir-emtricitabine-tenofovir AF Take 1 tablet by mouth daily.   gabapentin 300 MG capsule Commonly known as: NEURONTIN Take 2 capsules (600 mg total) by mouth 3 (three) times daily.   hydrOXYzine 25 MG tablet Commonly known as: ATARAX Take 1  tablet (25 mg total) by mouth daily as needed for anxiety. What changed: when to take this   nicotine polacrilex 4 MG gum Commonly known as: NICORETTE Take 4 mg by mouth as needed for smoking cessation.   omeprazole 40 MG capsule Commonly known as: PRILOSEC Take 1 capsule (40 mg total) by mouth daily.   QUEtiapine 300 MG tablet Commonly known as: SEROQUEL Take 1 tablet (300 mg total) by mouth at bedtime.   sildenafil 100 MG tablet Commonly known as: Viagra Take 1 tablet (100 mg total) by mouth daily as needed for erectile dysfunction. At least 24 hours between doses.   traZODone 50 MG tablet Commonly known as: DESYREL Take 1 tablet (50 mg total) by mouth at bedtime as needed for sleep.        Discharge Instructions: Please refer to Patient Instructions section of EMR for full details.  Patient was counseled important signs and symptoms that should prompt return to medical care, changes in medications, dietary instructions, activity restrictions, and follow up appointments.   Follow-Up Appointments:  Follow-up Information     Hoy Register, MD. Schedule an appointment as soon as possible for a visit in 1 week(s).   Specialty: Family Medicine Contact information: [address] [place] [address] [phone]                 Vonna Drafts, MD 07/04/2022, 10:59 AM PGY-1, [facility] Family Medicine

## 2022-07-04 NOTE — Progress Notes (Signed)
Pt to be d/c home to self care.  Reviewed AVS with pt.  Made him aware of his f/u appointments and changes to her medication list.  Answered any pending questions.  Pt has no further questions.  Assisted pt in gathering his belongings and getting dressed.  Removed PIV which was CDI and free from s/sx of infection.  Pt walked off unit accompanied by family to visitors entrance where his ride awaited to take him home.  Pt d/c in stable condition.

## 2022-07-04 NOTE — Progress Notes (Signed)
Patient had a quite night. Remains alert and oriented. Voiding adequately. I and O documented. IV fluid therapy ongoing. VS stable. CK trending down. Liver enzymes remain elevated. Due review this morning my medical team. Safety maintained at all times.

## 2022-07-05 ENCOUNTER — Telehealth: Payer: Self-pay

## 2022-07-05 ENCOUNTER — Encounter (HOSPITAL_COMMUNITY): Payer: Self-pay

## 2022-07-05 NOTE — Transitions of Care (Post Inpatient/ED Visit) (Signed)
   07/05/2022  Name: Dylan [name] MRN: [id number] DOB: [birth year]  Today's TOC FU Call Status: TOC FU Call Complete Date: 07/05/22  Attempted to reach the patient regarding the most recent Inpatient/ED visit.  Follow Up Plan: No further outreach attempts will be made at this time. We have been unable to contact the patient.  Signature  Robyne Peers, RN

## 2022-07-06 ENCOUNTER — Telehealth: Payer: Self-pay

## 2022-07-06 ENCOUNTER — Ambulatory Visit: Payer: Medicaid Other | Admitting: Family Medicine

## 2022-07-06 NOTE — Telephone Encounter (Signed)
Dr Alvis Lemmings-    He explained that he is working daily 0700-1600 and would like the appointment in 7/8 with you to be virtual.  Would that be okay?   He is living in [facility] and has not changed his Medicaid from [place] to [place].  I informed him that Medicaid will provide out of county rides to medical appointments if needed but he will need to change the Medicaid

## 2022-07-06 NOTE — Transitions of Care (Post Inpatient/ED Visit) (Signed)
07/06/2022  Name: Dylan [name] MRN: [id number] DOB: 1965-05-19  Today's TOC FU Call Status: Today's TOC FU Call Status:: Unsuccessul Call (1st Attempt) Unsuccessful Call (1st Attempt) Date: 07/05/22 [facility] FU Call Complete Date: 07/06/22  Transition Care Management Follow-up Telephone Call Date of Discharge: 07/04/22 Discharge Facility: [name]) Type of Discharge: Inpatient Admission Primary Inpatient Discharge Diagnosis:: rhabdomyolysis How have you been since you were released from the hospital?: Better Any questions or concerns?: Yes Patient Questions/Concerns:: He explained that he is working daily 0700-1600 and would like the appointment in 7/8 to be virtual.  He is living in [facility] [place] [facility] [place] and has not changed his Medicaid from [place] to [place].  I informed him that Medicaid will provide out of county rides ot medical appointments if needed but he will need to change the Medicaid Patient Questions/Concerns Addressed: Notified Provider of Patient Questions/Concerns  Items Reviewed: Did you receive and understand the discharge instructions provided?: Yes Medications obtained,verified, and reconciled?: Partial Review Completed Medications Not Reviewed Reasons:: Other: Reason for Partial Mediation Review: No new medications were ordered. He reviewed the medications he is to stop taking, He would like to know what he will be taking in place of the rosuvastatin.  He said he has everything else and did not have any questions about the meds or need to review the list Any new allergies since your discharge?: No Dietary orders reviewed?: Yes Type of Diet Ordered:: heart healthy Do you have support at home?: Yes Name of Support/Comfort Primary Source: roommate  Medications Reviewed Today: Medications Reviewed Today     Reviewed by Cruthis, Clinton Gallant, CPhT (Pharmacy Technician) on 07/02/22 at 1356  Med List Status: Complete   Medication Order Taking? Sig Documenting  Provider Last Dose Status Informant  aspirin EC 81 MG tablet [id number] Yes Take 1 tablet (81 mg total) by mouth daily. Swallow whole. Dorcas Carrow, MD Past Week Active Self, Pharmacy Records  bictegravir-emtricitabine-tenofovir AF (BIKTARVY) 50-200-25 MG TABS tablet [id number] Yes Take 1 tablet by mouth daily. Judyann Munson, MD Past Week Active Self, Pharmacy Records  gabapentin (NEURONTIN) 300 MG capsule [id number] Yes Take 2 capsules (600 mg total) by mouth 3 (three) times daily. Rex Kras, MD Past Week Active Self, Pharmacy Records           Med Note Epimenio Sarin, Seth Bake Jul 02, 2022  1:56 PM) LF 04/24/22 for 30 DS.  Pt is adamant he is still taking this medication daily. Dispense report does not support this claim.   hydrOXYzine (ATARAX) 25 MG tablet [id number] Yes Take 1 tablet (25 mg total) by mouth daily as needed for anxiety.  Patient taking differently: Take 25 mg by mouth as needed for anxiety.   Dorisann Frames Active Self, Pharmacy Records           Med Note Epimenio Sarin, Seth Bake Jul 02, 2022  1:53 PM) Pt is unsure of last dose.   ibuprofen (ADVIL) 800 MG tablet [id number] No Take 1 tablet (800 mg total) by mouth every 8 (eight) hours as needed.  Patient not taking: Reported on 07/02/2022   Adonis Huguenin, NP Not Taking Active Self, Pharmacy Records  methocarbamol (ROBAXIN) 500 MG tablet [id number] No Take 1 tablet (500 mg total) by mouth every 6 (six) hours as needed for muscle spasms.  Patient not taking: Reported on 07/02/2022   Adonis Huguenin, NP Completed Course Active Self, Pharmacy Records  nicotine polacrilex (NICORETTE) 4 MG gum [id number]  Yes Take 4 mg by mouth as needed for smoking cessation. [provider] unk Active Self, Pharmacy Records           Med Note (CRUTHIS, Seth Bake Jul 02, 2022  1:53 PM) Pt is unsure of last dose.   omeprazole (PRILOSEC) 40 MG capsule [id number] Yes Take 1 capsule (40 mg total) by mouth daily. Hoy Register, MD  Past Week Active Self, Pharmacy Records           Med Note Epimenio Sarin, Seth Bake Jul 02, 2022  1:55 PM) LF 04/24/22 for 30 DS. Pt is adamant he is still taking this medication daily. Dispense report does not support this claim.  QUEtiapine (SEROQUEL) 300 MG tablet [id number] Yes Take 1 tablet (300 mg total) by mouth at bedtime. Bahraini, Shawn Route Past Week Active Self, Pharmacy Records  rosuvastatin (CRESTOR) 20 MG tablet [id number] Yes Take 1 tablet (20 mg total) by mouth daily. Maisie Fus, MD Past Week Active Self, Pharmacy Records           Med Note Epimenio Sarin, Seth Bake Jul 02, 2022  1:54 PM) Per dispense report the last dose of this medication filled was 10 mg. Pt is adamant he take one 20 mg tablet daily.   sildenafil (VIAGRA) 100 MG tablet [id number] Yes Take 1 tablet (100 mg total) by mouth daily as needed for erectile dysfunction. At least 24 hours between doses. Hoy Register, MD Past Month Active Self, Pharmacy Records  traZODone (DESYREL) 50 MG tablet [id number] Yes Take 1 tablet (50 mg total) by mouth at bedtime as needed for sleep. Dorisann Frames Active Self, Pharmacy Records           Med Note Epimenio Sarin, Seth Bake Jul 02, 2022  1:54 PM) Pt is unsure of last dose.             Home Care and Equipment/Supplies: Were Home Health Services Ordered?: No Any new equipment or medical supplies ordered?: No  Functional Questionnaire: Do you need assistance with bathing/showering or dressing?: No Do you need assistance with meal preparation?: No Do you need assistance with eating?: No Do you have difficulty maintaining continence: No Do you need assistance with getting out of bed/getting out of a chair/moving?: No Do you have difficulty managing or taking your medications?: No  Follow up appointments reviewed: PCP Follow-up appointment confirmed?: Yes Date of PCP follow-up appointment?: 07/17/22 Follow-up Provider: [facility] Follow-up  appointment confirmed?: NA Do you need transportation to your follow-up appointment?: No (He is requesting a virtual visit due to his work schedule.) Do you understand care options if your condition(s) worsen?: Yes-patient verbalized understanding    SIGNATURE  Robyne Peers, RN

## 2022-07-06 NOTE — Telephone Encounter (Signed)
noted 

## 2022-07-12 ENCOUNTER — Encounter: Payer: Self-pay | Admitting: Family Medicine

## 2022-07-12 ENCOUNTER — Telehealth (HOSPITAL_COMMUNITY): Payer: Medicaid Other | Admitting: Psychiatry

## 2022-07-12 ENCOUNTER — Other Ambulatory Visit: Payer: Self-pay | Admitting: Family Medicine

## 2022-07-12 MED ORDER — GABAPENTIN 300 MG PO CAPS: 600.0000 mg | ORAL_CAPSULE | Freq: Three times a day (TID) | ORAL | 1 refills | Status: DC

## 2022-07-13 ENCOUNTER — Encounter: Payer: Self-pay | Admitting: Family Medicine

## 2022-07-13 ENCOUNTER — Encounter: Payer: Self-pay | Admitting: Internal Medicine

## 2022-07-17 ENCOUNTER — Encounter: Payer: Self-pay | Admitting: Family Medicine

## 2022-07-17 ENCOUNTER — Ambulatory Visit: Payer: MEDICAID | Attending: Family Medicine | Admitting: Family Medicine

## 2022-07-17 DIAGNOSIS — R413 Other amnesia: Secondary | ICD-10-CM | POA: Diagnosis not present

## 2022-07-17 DIAGNOSIS — E785 Hyperlipidemia, unspecified: Secondary | ICD-10-CM | POA: Diagnosis not present

## 2022-07-17 DIAGNOSIS — M6282 Rhabdomyolysis: Secondary | ICD-10-CM

## 2022-07-17 DIAGNOSIS — G629 Polyneuropathy, unspecified: Secondary | ICD-10-CM | POA: Insufficient documentation

## 2022-07-17 DIAGNOSIS — Z79899 Other long term (current) drug therapy: Secondary | ICD-10-CM | POA: Diagnosis not present

## 2022-07-17 DIAGNOSIS — E782 Mixed hyperlipidemia: Secondary | ICD-10-CM

## 2022-07-17 NOTE — Progress Notes (Signed)
Virtual Visit via Video Note  I connected with Dylan [name], on 07/17/2022 at 11:39 AM by video enabled telemedicine device and verified that I am speaking with the correct person using two identifiers.   Consent: I discussed the limitations, risks, security and privacy concerns of performing an evaluation and management service by telemedicine and the availability of in person appointments. I also discussed with the patient that there may be a patient responsible charge related to this service. The patient expressed understanding and agreed to proceed.   Location of Patient: Home  Location of Provider: Clinic   Persons participating in Telemedicine visit: Dylan [name] Dr. Alvis Lemmings     History of Present Illness: Dylan [name] is a 57 y.o. year old male  with a history of HIV (on antiretroviral therapy), hypertension, schizoaffective disorder, substance abuse, Status post left TKA in 12/2021.  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  CK was elevated to 39,000 and liver enzymes elevated Discussed the use of AI scribe software for clinical note transcription with the patient, who gave verbal consent to proceed.  He presents for follow-up after a second episode of rhabdomyolysis. The first episode occurred in 2019 and required dialysis. The patient does not recall if he was dehydrated during the first episode but remembers his urine was the color of coffee. He has been taking statins for hyperlipidemia for several years, including during the first episode of rhabdomyolysis. The patient works in a [employer], [employer] and sometimes works outside. He has started carrying large bottles of water to stay hydrated.  The patient also reports memory problems over the past year, such as forgetting what he was going to do and having difficulty balancing his accounts. He denies forgetting to turn off the  stove or forgetting directions. He manages his own finances and does not have a family history of dementia. The patient also has peripheral neuropathy, which is managed with gabapentin. He also has neuropathy for which he is on gabapentin; reports that the medication is effective when he remembers to take it.        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   Intentional drug overdose (HCC) 11/26/2021   Neuromuscular disorder (HCC)    neuropathy   Neuropathy    Pre-diabetes    Rhabdomyolysis 02/19/2017   Unilateral primary osteoarthritis, left knee 12/14/2021   Allergies  Allergen Reactions   Penicillins Other (See Comments)    Childhood allergy   Pork-Derived Products Other (See Comments)    Patient preference   Latex Rash   Tape Rash    Current Outpatient Medications on File Prior to Visit  Medication Sig Dispense Refill   aspirin EC 81 MG tablet Take 1 tablet (81 mg total) by mouth daily. Swallow whole. 30 tablet 12   bictegravir-emtricitabine-tenofovir AF (BIKTARVY) 50-200-25 MG TABS tablet Take 1 tablet by mouth daily. 30 tablet 11   gabapentin (NEURONTIN) 300 MG capsule Take 2 capsules (600 mg total) by mouth 3 (three) times daily. 180 capsule 1   hydrOXYzine (ATARAX) 25 MG tablet Take 1  tablet (25 mg total) by mouth daily as needed for anxiety. (Patient taking differently: Take 25 mg by mouth as needed for anxiety.) 30 tablet 2   nicotine polacrilex (NICORETTE) 4 MG gum Take 4 mg by mouth as needed for smoking cessation.     omeprazole (PRILOSEC) 40 MG capsule Take 1 capsule (40 mg total) by mouth daily. 30 capsule 3   QUEtiapine (SEROQUEL) 300 MG tablet Take 1 tablet (300 mg total) by mouth at bedtime. 30  tablet 2   sildenafil (VIAGRA) 100 MG tablet Take 1 tablet (100 mg total) by mouth daily as needed for erectile dysfunction. At least 24 hours between doses. 10 tablet 2   traZODone (DESYREL) 50 MG tablet Take 1 tablet (50 mg total) by mouth at bedtime as needed for sleep. 30 tablet 2   No current facility-administered medications on file prior to visit.    ROS: See HPI  Observations/Objective: Awake, alert, oriented x 3 Not in acute distress Normal respiration Full range of motion of extremities Normal mood     Latest Ref Rng & Units 07/04/2022    4:14 AM 07/03/2022    3:39 AM 07/02/2022    3:50 AM  CMP  Glucose 70 - 99 mg/dL 161  096  045   BUN 6 - 20 mg/dL 9  9  14    Creatinine 0.61 - 1.24 mg/dL 4.09  8.11  9.14   Sodium 135 - 145 mmol/L 138  140  137   Potassium 3.5 - 5.1 mmol/L 3.7  3.7  3.6   Chloride 98 - 111 mmol/L 102  107  105   CO2 22 - 32 mmol/L 27  26  24    Calcium 8.9 - 10.3 mg/dL 9.3  8.7  9.0   Total Protein 6.5 - 8.1 g/dL 6.1  5.3  5.9   Total Bilirubin 0.3 - 1.2 mg/dL 0.6  0.4  0.8   Alkaline Phos 38 - 126 U/L 66  67  74   AST 15 - 41 U/L 216  292  526   ALT 0 - 44 U/L 143  145  180     Lipid Panel     Component Value Date/Time   CHOL 138 04/02/2022 0637   CHOL 158 02/13/2019 1128   TRIG 258 (H) 04/02/2022 0637   HDL 35 (L) 04/02/2022 0637   HDL 41 02/13/2019 1128   CHOLHDL 3.9 04/02/2022 0637   VLDL 52 (H) 04/02/2022 0637   LDLCALC 51 04/02/2022 0637   LDLCALC 97 02/13/2019 1128   LDLCALC 149 (H) 06/06/2018 1406   LABVLDL 20 02/13/2019 1128    Lab Results  Component Value Date   HGBA1C 6.2 (H) 04/03/2022     Assessment and Plan:     Rhabdomyolysis: Second episode, first in 2019 requiring dialysis. Possible dehydration as a contributing factor. Statin use may also be a contributing factor. -Order blood work to check CK, liver enzymes, kidney function, and autoimmune markers. -Refer to rheumatology if autoimmune markers are  abnormal. -Continue to hold statin medication and refer to cardiology for alternative cholesterol management.  Memory Issues: Patient reports memory issues for the past year, including difficulty balancing accounts and forgetting tasks. No family history of dementia. -Order blood work for Alzheimer's risk. -Order CT scan of the head. -Perform mini mental state exam during next clinic visit. 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  Hyperlipidemia: On statin therapy, currently held due to rhabdomyolysis. -Refer to cardiology for alternative cholesterol  management - PCSK9i  Peripheral Neuropathy: Managed with gabapentin. -Continue gabapentin, encourage consistent use.  General Health Maintenance: -Encourage hydration, especially during work in hot environments. -Encourage heart-healthy diet.        Follow Up Instructions: 3 Months   I discussed the assessment and treatment plan with the patient. The patient was provided an opportunity to ask questions and all were answered. The patient agreed with the plan and demonstrated an understanding of the instructions.   The patient was advised to call back or seek an in-person evaluation if the symptoms worsen or if the condition fails to improve as anticipated.     I provided 19 minutes total of Telehealth time during this encounter including median intraservice time, reviewing previous notes, investigations, ordering medications, medical decision making, coordinating care and patient verbalized understanding at the end of the visit.     Hoy Register, MD, FAAFP. [facility] [place], [place] [phone]   07/17/2022, 11:39 AM

## 2022-07-31 ENCOUNTER — Encounter: Payer: Self-pay | Admitting: Family Medicine

## 2022-08-08 ENCOUNTER — Other Ambulatory Visit: Payer: MEDICAID

## 2022-08-25 ENCOUNTER — Ambulatory Visit (HOSPITAL_COMMUNITY)
Admission: EM | Admit: 2022-08-25 | Discharge: 2022-08-27 | Disposition: A | Discharge status: Other Acute Inpatient Hospital | Payer: MEDICAID | Attending: Family | Admitting: Family

## 2022-08-25 DIAGNOSIS — Z1152 Encounter for screening for COVID-19: Secondary | ICD-10-CM | POA: Insufficient documentation

## 2022-08-25 DIAGNOSIS — I491 Atrial premature depolarization: Secondary | ICD-10-CM | POA: Insufficient documentation

## 2022-08-25 DIAGNOSIS — Z79899 Other long term (current) drug therapy: Secondary | ICD-10-CM | POA: Insufficient documentation

## 2022-08-25 DIAGNOSIS — F3130 Bipolar disorder, current episode depressed, mild or moderate severity, unspecified: Secondary | ICD-10-CM | POA: Diagnosis present

## 2022-08-25 DIAGNOSIS — F251 Schizoaffective disorder, depressive type: Secondary | ICD-10-CM | POA: Insufficient documentation

## 2022-08-25 DIAGNOSIS — F1910 Other psychoactive substance abuse, uncomplicated: Secondary | ICD-10-CM | POA: Diagnosis present

## 2022-08-25 DIAGNOSIS — Z79624 Long term (current) use of inhibitors of nucleotide synthesis: Secondary | ICD-10-CM | POA: Insufficient documentation

## 2022-08-25 DIAGNOSIS — Z7982 Long term (current) use of aspirin: Secondary | ICD-10-CM | POA: Insufficient documentation

## 2022-08-25 DIAGNOSIS — R45851 Suicidal ideations: Secondary | ICD-10-CM

## 2022-08-25 DIAGNOSIS — F319 Bipolar disorder, unspecified: Secondary | ICD-10-CM | POA: Insufficient documentation

## 2022-08-25 DIAGNOSIS — F411 Generalized anxiety disorder: Secondary | ICD-10-CM | POA: Insufficient documentation

## 2022-08-25 DIAGNOSIS — Z87891 Personal history of nicotine dependence: Secondary | ICD-10-CM | POA: Insufficient documentation

## 2022-08-25 DIAGNOSIS — Z9151 Personal history of suicidal behavior: Secondary | ICD-10-CM | POA: Insufficient documentation

## 2022-08-25 LAB — POCT URINE DRUG SCREEN - MANUAL ENTRY (I-SCREEN)
POC Amphetamine UR: NOT DETECTED
POC Buprenorphine (BUP): NOT DETECTED
POC Cocaine UR: POSITIVE — AB
POC Marijuana UR: POSITIVE — AB
POC Methadone UR: NOT DETECTED
POC Methamphetamine UR: NOT DETECTED
POC Morphine: NOT DETECTED
POC Oxazepam (BZO): NOT DETECTED
POC Oxycodone UR: NOT DETECTED
POC Secobarbital (BAR): NOT DETECTED

## 2022-08-25 LAB — COMPREHENSIVE METABOLIC PANEL
ALT: 43 U/L (ref 0–44)
AST: 42 U/L — ABNORMAL HIGH (ref 15–41)
Albumin: 4.6 g/dL (ref 3.5–5.0)
Alkaline Phosphatase: 91 U/L (ref 38–126)
Anion gap: 10 (ref 5–15)
BUN: 14 mg/dL (ref 6–20)
CO2: 24 mmol/L (ref 22–32)
Calcium: 9.7 mg/dL (ref 8.9–10.3)
Chloride: 103 mmol/L (ref 98–111)
Creatinine, Ser: 1.12 mg/dL (ref 0.61–1.24)
GFR, Estimated: >60 mL/min (ref >60)
Glucose, Bld: 90 mg/dL (ref 70–99)
Potassium: 3.6 mmol/L (ref 3.5–5.1)
Sodium: 137 mmol/L (ref 135–145)
Total Bilirubin: 0.9 mg/dL (ref 0.3–1.2)
Total Protein: 8.3 g/dL — ABNORMAL HIGH (ref 6.5–8.1)

## 2022-08-25 LAB — CBC WITH DIFFERENTIAL/PLATELET
Abs Immature Granulocytes: 0.02 10*3/uL (ref 0.00–0.07)
Basophils Absolute: 0 10*3/uL (ref 0.0–0.1)
Basophils Relative: 0 %
Eosinophils Absolute: 0 10*3/uL (ref 0.0–0.5)
Eosinophils Relative: 0 %
HCT: 43.3 % (ref 39.0–52.0)
Hemoglobin: 14.3 g/dL (ref 13.0–17.0)
Immature Granulocytes: 0 %
Lymphocytes Relative: 25 %
Lymphs Abs: 1.5 10*3/uL (ref 0.7–4.0)
MCH: 30 pg (ref 26.0–34.0)
MCHC: 33 g/dL (ref 30.0–36.0)
MCV: 90.8 fL (ref 80.0–100.0)
Monocytes Absolute: 0.5 10*3/uL (ref 0.1–1.0)
Monocytes Relative: 8 %
Neutro Abs: 4.1 10*3/uL (ref 1.7–7.7)
Neutrophils Relative %: 67 %
Platelets: 201 10*3/uL (ref 150–400)
RBC: 4.77 MIL/uL (ref 4.22–5.81)
RDW: 13 % (ref 11.5–15.5)
WBC: 6.2 10*3/uL (ref 4.0–10.5)
nRBC: 0 % (ref 0.0–0.2)

## 2022-08-25 LAB — ETHANOL: Alcohol, Ethyl (B): <10 mg/dL (ref <10)

## 2022-08-25 LAB — MAGNESIUM: Magnesium: 2.5 mg/dL — ABNORMAL HIGH (ref 1.7–2.4)

## 2022-08-25 LAB — TSH: TSH: 0.667 u[IU]/mL (ref 0.350–4.500)

## 2022-08-25 MED ORDER — ACETAMINOPHEN 325 MG PO TABS: 650.0000 mg | ORAL_TABLET | Freq: Four times a day (QID) | ORAL | Status: DC | PRN

## 2022-08-25 MED ORDER — GABAPENTIN 300 MG PO CAPS
600.0000 mg | ORAL_CAPSULE | Freq: Three times a day (TID) | ORAL | Status: DC
  Administered 2022-08-25 – 2022-08-26 (×3): 600 mg via ORAL
  Filled 2022-08-25 (×4): qty 2

## 2022-08-25 MED ORDER — HYDROXYZINE HCL 25 MG PO TABS: 25.0000 mg | ORAL_TABLET | Freq: Three times a day (TID) | ORAL | Status: DC | PRN

## 2022-08-25 MED ORDER — FLUTICASONE PROPIONATE 50 MCG/ACT NA SUSP
2.0000 | Freq: Every day | NASAL | Status: DC | PRN
  Administered 2022-08-26: 2 via NASAL
  Filled 2022-08-25: qty 16

## 2022-08-25 MED ORDER — ALUM & MAG HYDROXIDE-SIMETH 200-200-20 MG/5ML PO SUSP: 30.0000 mL | ORAL | Status: DC | PRN

## 2022-08-25 MED ORDER — NICOTINE 14 MG/24HR TD PT24
14.0000 mg | MEDICATED_PATCH | Freq: Every day | TRANSDERMAL | Status: DC
  Administered 2022-08-26: 14 mg via TRANSDERMAL
  Filled 2022-08-25: qty 1

## 2022-08-25 MED ORDER — MAGNESIUM HYDROXIDE 400 MG/5ML PO SUSP: 30.0000 mL | Freq: Every day | ORAL | Status: DC | PRN

## 2022-08-25 MED ORDER — TRAZODONE HCL 50 MG PO TABS: 50.0000 mg | ORAL_TABLET | Freq: Every evening | ORAL | Status: DC | PRN

## 2022-08-25 MED ORDER — PANTOPRAZOLE SODIUM 40 MG PO TBEC
40.0000 mg | DELAYED_RELEASE_TABLET | Freq: Every day | ORAL | Status: DC
  Administered 2022-08-25 – 2022-08-26 (×2): 40 mg via ORAL
  Filled 2022-08-25 (×2): qty 1

## 2022-08-25 MED ORDER — ASPIRIN 81 MG PO TBEC
81.0000 mg | DELAYED_RELEASE_TABLET | Freq: Every day | ORAL | Status: DC
  Administered 2022-08-25 – 2022-08-26 (×2): 81 mg via ORAL
  Filled 2022-08-25 (×2): qty 1

## 2022-08-25 MED ORDER — BICTEGRAVIR-EMTRICITAB-TENOFOV 50-200-25 MG PO TABS
1.0000 | ORAL_TABLET | Freq: Every day | ORAL | Status: DC
  Administered 2022-08-25 – 2022-08-26 (×2): 1 via ORAL
  Filled 2022-08-25 (×2): qty 1

## 2022-08-25 MED ORDER — QUETIAPINE FUMARATE 50 MG PO TABS
150.0000 mg | ORAL_TABLET | Freq: Every day | ORAL | Status: DC
  Administered 2022-08-25: 150 mg via ORAL
  Filled 2022-08-25 (×2): qty 1

## 2022-08-25 NOTE — Progress Notes (Signed)
   08/25/22 1750  [facility] Triage Screening (Walk-ins at [facility] only)  What Is the Reason for Your Visit/Call Today? Patient is a 57 year old male who present voluntarily to [facility] via [employer] after he was put out of a local bar for asking for a knife to cut away the "skin mites". Patient reports  having diagnoses of Schizoaffective disorder and bipolar. Patient reports he is being follwed by psychiatry at [facility].  He siad he missed his last appointment as it was canclled by the provder and was never rescheduled.   Patient states that he has visions  of his daughter whas pssed Geraldo Docker  4 years agoa at the age of 70.  Patient reprost passive SI.  He says that he thinks about being ble to go to sleep an not waking up.  Patient denies  HI and AVH.  He endoorses having tactile hallucintions.  He says the reason he asked for the knife was because the skin mites were bting on his arm and he wanted to cut it off.  Patient also endorse paranoia as feels some is watrching him.   Patient reports occasional alcohol and cocaine use.  He acknowledges that he has recently begun using THC purchased at a smoke shop.  He says he hd 2 16 oz Coors light last  night.  How Long Has This Been Causing You Problems? 1-6 months  Have You Recently Had Any Thoughts About Hurting Yourself? Yes  Are You Planning to Commit Suicide/Harm Yourself At This time? No  Have you Recently Had Thoughts About Hurting Someone Karolee Ohs? No  Are You Planning To Harm Someone At This Time? No  Explanation: NA  Have You Used Any Alcohol or Drugs in the Past 24 Hours? Yes  What Did You Use and How Much? alcohol. 2 coor's lite  What Do You Feel Would Help You the Most Today? Treatment for Depression or other mood problem

## 2022-08-25 NOTE — ED Provider Notes (Signed)
[name] Continuous Assessment Admission H&P  Date: 08/25/22 Patient Name: [name] MRN: [id number] Chief Complaint: suicidal ideation  Diagnoses:  Final diagnoses:  Bipolar I disorder, most recent episode depressed [facility])    HPI: Patient presents voluntarily to [name], transported by [name].  Patient was at a local bar today, asked bartender for a knife so that he could cut off his arm.  He was asked to leave establishment and law enforcement contacted.  [name] is assessed by this nurse practitioner face-to-face.  He is seated in observation area, no apparent distress.  He is alert and oriented, pleasant and cooperative during assessment.  He presents with depressed mood, congruent affect.  Chart reviewed and patient discussed with [name] on 08/25/2022. Inpatient psychiatric admission recommended for treatment and stabilization.   Patient endorses depressive symptoms worsening x 1 month.  He reports anhedonia, depressed mood, suicidal ideation.  Endorses active suicidal ideation today with plan to "take something to make me go to sleep and not wake up, they brought me back from pills last time." He endorses history of four previous suicide attempts. Most recent attempt in March 2024 when he stepped in front of a bus. He denies history of non suicidal self-harm behavior. He denies homicidal ideation.  [name] has history of major depressive disorder and bipolar disorder.  He is followed by outpatient psychiatry for medication management at [name].  His appointment was canceled 1 month ago and he has not been unable to follow-up since that time.  He ran out of his medications 1 month ago.  He has been stable on a combination of medications including gabapentin, hydroxyzine, quetiapine and trazodone.  He is not linked with individual counseling currently.  He endorses history of multiple previous inpatient psychiatric  hospitalizations.  Most recent inpatient hospitalization at [name] in March 2024. No family mental health or addiction history reported.  Chronic triggers include the death of his 15-year-old daughter approximately 5 years ago.  Patient states "I sometimes hear the voice of my daughter, she died at 17 years old."  He denies auditory and visual hallucinations currently.  Patient reports feeling a gnawing sensation on his arm earlier this date.  He asked bartender for a knife earlier today so that he could "cut my arm off, it is annoying."  [name] endorses substance use including marijuana, typically uses marijuana daily.  Most recent marijuana use approximately 1 week ago.  He uses cocaine several times per week.  Most recent cocaine use 4 days ago.  Uses alcohol rarely an average of 1 time per month.  Most recent alcohol use 2 beers earlier this date.  He denies history of alcohol withdrawal, denies history of alcohol-related seizure.  Patient resides in [place] with a roommate.  He denies access to weapons.  He works at the [employer] in [place].  He endorses average sleep and appetite.  Patient offered support and encouragement. He agrees with plan for inpatient psychiatric hospitalization.  Reviewed medications, discussed potential side effects and patient offered opportunity to ask questions.    Total Time spent with patient: 45 minutes  Musculoskeletal  Strength & Muscle Tone: within normal limits Gait & Station: normal Patient leans: N/A  Psychiatric Specialty Exam  Presentation General Appearance:  Appropriate for Environment; Casual  Eye Contact: Fair  Speech: Clear and Coherent; Normal Rate  Speech Volume: Normal  Handedness: Right   Mood and Affect  Mood: Depressed  Affect: Depressed  Thought Process  Thought Processes: Coherent; Goal Directed  Descriptions of Associations:Intact  Orientation:Full (Time, Place and Person)  Thought  Content:Logical  Diagnosis of Schizophrenia or Schizoaffective disorder in past: Yes   Hallucinations:Hallucinations: None  Ideas of Reference:None  Suicidal Thoughts:Suicidal Thoughts: Yes, Active SI Active Intent and/or Plan: With Plan; With Intent  Homicidal Thoughts:Homicidal Thoughts: No   Sensorium  Memory: Immediate Good; Recent Fair  Judgment: Intact  Insight: Shallow   Executive Functions  Concentration: Good  Attention Span: Good  Recall: Good  Fund of Knowledge: Good  Language: Good   Psychomotor Activity  Psychomotor Activity: Psychomotor Activity: Normal   Assets  Assets: Communication Skills; Physical Health; Desire for Improvement; Financial Resources/Insurance; Housing; Resilience; Social Support; Leisure Time   Sleep  Sleep: Sleep: Fair   Nutritional Assessment (For OBS and FBC admissions only) Has the patient had a weight loss or gain of 10 pounds or more in the last 3 months?: No Has the patient had a decrease in food intake/or appetite?: No Does the patient have dental problems?: No Does the patient have eating habits or behaviors that may be indicators of an eating disorder including binging or inducing vomiting?: No Has the patient recently lost weight without trying?: 0 Has the patient been eating poorly because of a decreased appetite?: 0 Malnutrition Screening Tool Score: 0    Physical Exam Vitals and nursing note reviewed.  Constitutional:      Appearance: Normal appearance. He is well-developed.  HENT:     Head: Normocephalic and atraumatic.     Nose: Nose normal.  Cardiovascular:     Rate and Rhythm: Normal rate.  Pulmonary:     Effort: Pulmonary effort is normal.  Musculoskeletal:        General: Normal range of motion.     Cervical back: Normal range of motion.  Skin:    General: Skin is warm and dry.  Neurological:     Mental Status: He is alert and oriented to person, place, and time.  Psychiatric:         Attention and Perception: Attention normal. He perceives auditory hallucinations.        Mood and Affect: Affect normal. Mood is depressed.        Speech: Speech normal.        Behavior: Behavior normal. Behavior is cooperative.        Thought Content: Thought content includes suicidal ideation. Thought content includes suicidal plan.        Cognition and Memory: Cognition and memory normal.    Review of Systems  Constitutional: Negative.   HENT: Negative.    Eyes: Negative.   Respiratory: Negative.    Cardiovascular: Negative.   Gastrointestinal: Negative.   Genitourinary: Negative.   Musculoskeletal: Negative.   Skin: Negative.   Neurological: Negative.   Psychiatric/Behavioral:  Positive for depression, substance abuse and suicidal ideas.     Blood pressure 131/86, pulse 96, temperature 98.8 F (37.1 C), temperature source Oral, resp. rate 20, SpO2 100%. There is no height or weight on file to calculate BMI.  Past Psychiatric History: generalized anxiety disorder, bipolar 1 disorder, MDD, suicidal ideation, substance induced mood disorder, methamphetamine abuse, cocaine use disorder, PTSD, alcohol use disorder, polysubstance abuse   Is the patient at risk to self? Yes  Has the patient been a risk to self in the past 6 months? Yes    Has the patient been a risk to self within the distant past? Yes   Is the  patient a risk to others? No   Has the patient been a risk to others in the past 6 months? No   Has the patient been a risk to others within the distant past? No   Past Medical History: Rhabdomyolysis, arthritis, colon polyps, GERD, hepatitis B, HIV, hyperlipidemia, hypertension, neuromuscular disorder, neuropathy, prediabetes  Family History: none reported  Social History: resides with roommate, employed, chronic alcohol, marijuana and cocaine use  Last Labs:  Admission on 07/01/2022, Discharged on 07/04/2022  Component Date Value Ref Range Status   WBC 07/01/2022  10.5  4.0 - 10.5 K/uL Final   RBC 07/01/2022 4.81  4.22 - 5.81 MIL/uL Final   Hemoglobin 07/01/2022 14.6  13.0 - 17.0 g/dL Final   HCT 16/10/9602 43.4  39.0 - 52.0 % Final   MCV 07/01/2022 90.2  80.0 - 100.0 fL Final   MCH 07/01/2022 30.4  26.0 - 34.0 pg Final   MCHC 07/01/2022 33.6  30.0 - 36.0 g/dL Final   RDW 54/09/8117 13.9  11.5 - 15.5 % Final   Platelets 07/01/2022 193  150 - 400 K/uL Final   nRBC 07/01/2022 0.0  0.0 - 0.2 % Final   Neutrophils Relative % 07/01/2022 67  % Final   Neutro Abs 07/01/2022 7.0  1.7 - 7.7 K/uL Final   Lymphocytes Relative 07/01/2022 22  % Final   Lymphs Abs 07/01/2022 2.3  0.7 - 4.0 K/uL Final   Monocytes Relative 07/01/2022 11  % Final   Monocytes Absolute 07/01/2022 1.1 (H)  0.1 - 1.0 K/uL Final   Eosinophils Relative 07/01/2022 0  % Final   Eosinophils Absolute 07/01/2022 0.0  0.0 - 0.5 K/uL Final   Basophils Relative 07/01/2022 0  % Final   Basophils Absolute 07/01/2022 0.0  0.0 - 0.1 K/uL Final   Immature Granulocytes 07/01/2022 0  % Final   Abs Immature Granulocytes 07/01/2022 0.02  0.00 - 0.07 K/uL Final   Performed at [name], [address], [place], [place] [address]   Sodium 07/01/2022 135  135 - 145 mmol/L Final   Potassium 07/01/2022 3.7  3.5 - 5.1 mmol/L Final   Chloride 07/01/2022 99  98 - 111 mmol/L Final   CO2 07/01/2022 21 (L)  22 - 32 mmol/L Final   Glucose, Bld 07/01/2022 93  70 - 99 mg/dL Final   Glucose reference range applies only to samples taken after fasting for at least 8 hours.   BUN 07/01/2022 17  6 - 20 mg/dL Final   Creatinine, Ser 07/01/2022 1.51 (H)  0.61 - 1.24 mg/dL Final   Calcium 95/62/1308 9.5  8.9 - 10.3 mg/dL Final   Total Protein 65/78/4696 7.8  6.5 - 8.1 g/dL Final   Albumin 29/52/8413 4.4  3.5 - 5.0 g/dL Final   AST 24/40/1027 706 (H)  15 - 41 U/L Final   ALT 07/01/2022 203 (H)  0 - 44 U/L Final   Alkaline Phosphatase 07/01/2022 88  38 - 126 U/L Final   Total Bilirubin 07/01/2022 1.1  0.3 -  1.2 mg/dL Final   GFR, Estimated 07/01/2022 54 (L)  >60 mL/min Final   Comment: (NOTE) Calculated using the CKD-EPI Creatinine Equation (2021)    Anion gap 07/01/2022 15  5 - 15 Final   Performed at [facility], [address], [place], [place] [address]   Total CK 07/01/2022 39,211 (H)  49 - 397 U/L Final   Comment: RESULT CONFIRMED BY MANUAL DILUTION Performed at [facility], [address] [place], [place] [address]    Specimen Source 07/01/2022 URINE, CLEAN CATCH   Final   Color, Urine 07/01/2022 YELLOW  YELLOW Final   APPearance 07/01/2022 HAZY (A)  CLEAR Final   Specific Gravity, Urine 07/01/2022 1.004 (L)  1.005 - 1.030 Final   pH 07/01/2022 6.0  5.0 - 8.0 Final   Glucose, UA 07/01/2022 NEGATIVE  NEGATIVE mg/dL Final   Hgb urine dipstick 07/01/2022 LARGE (A)  NEGATIVE Final   Bilirubin Urine 07/01/2022 NEGATIVE  NEGATIVE Final   Ketones, ur 07/01/2022 NEGATIVE  NEGATIVE mg/dL Final   Protein, ur 60/45/4098 30 (A)  NEGATIVE mg/dL Final   Nitrite 11/91/4782 NEGATIVE  NEGATIVE Final   Leukocytes,Ua 07/01/2022 MODERATE (A)  NEGATIVE Final   RBC / HPF 07/01/2022 0-5  0 - 5 RBC/hpf Final   WBC, UA 07/01/2022 >50  0 - 5 WBC/hpf Final   Comment:        Reflex urine culture not performed if WBC <=10, OR if Squamous epithelial cells >5. If Squamous epithelial cells >5 suggest recollection.    Bacteria, UA 07/01/2022 RARE (A)  NONE SEEN Final   Squamous Epithelial / HPF 07/01/2022 0-5  0 - 5 /HPF Final   Mucus 07/01/2022 PRESENT   Final   Performed at [facility], [address] [place], [place] [address]   Specimen Description 07/01/2022 URINE, RANDOM   Final   Special Requests 07/01/2022 URINE, CLEAN CATCH   Final   Culture 07/01/2022    Final                   Value:NO GROWTH Performed at [name], [address], [place], [place] [address]    Report Status 07/01/2022 07/02/2022 FINAL   Final   Acetaminophen (Tylenol), Serum 07/01/2022 <10 (L)   10 - 30 ug/mL Final   Comment: (NOTE) Therapeutic concentrations vary significantly. A range of 10-30 ug/mL  may be an effective concentration for many patients. However, some  are best treated at concentrations outside of this range. Acetaminophen concentrations >150 ug/mL at 4 hours after ingestion  and >50 ug/mL at 12 hours after ingestion are often associated with  toxic reactions.  Performed at [name], [address] [place], [place] [address]    Hepatitis B Surface Ag 07/01/2022 Reactive (A)  NON REACTIVE Final   Sample Positive for HBsAg. Result confirmed by neutralization.   HCV Ab 07/01/2022 NON REACTIVE  NON REACTIVE Final   Comment: (NOTE) Nonreactive HCV antibody screen is consistent with no HCV infections,  unless recent infection is suspected or other evidence exists to indicate HCV infection.     Hep A IgM 07/01/2022 NON REACTIVE  NON REACTIVE Final   Hep B C IgM 07/01/2022 NON REACTIVE  NON REACTIVE Final   Performed at [name], [address], [place], [place] [address]   Total CK 07/02/2022 23,430 (H)  49 - 397 U/L Final   Comment: RESULT CONFIRMED BY MANUAL DILUTION Performed at [name], [address], [place], [place] [address]    Sodium 07/02/2022 137  135 - 145 mmol/L Final   Potassium 07/02/2022 3.6  3.5 - 5.1 mmol/L Final   Chloride 07/02/2022 105  98 - 111 mmol/L Final   CO2 07/02/2022 24  22 - 32 mmol/L Final   Glucose, Bld 07/02/2022 109 (H)  70 - 99 mg/dL Final   Glucose reference range applies  only to samples taken after fasting for at least 8 hours.   BUN 07/02/2022 14  6 - 20 mg/dL Final   Creatinine, Ser 07/02/2022 1.24  0.61 - 1.24 mg/dL Final   Calcium 16/10/9602 9.0  8.9 - 10.3 mg/dL Final   Total Protein 54/09/8117 5.9 (L)  6.5 - 8.1 g/dL Final   Albumin 14/78/2956 3.2 (L)  3.5 - 5.0 g/dL Final   AST 21/30/8657 526 (H)  15 - 41 U/L Final   ALT 07/02/2022 180 (H)  0 - 44 U/L Final   Alkaline Phosphatase  07/02/2022 74  38 - 126 U/L Final   Total Bilirubin 07/02/2022 0.8  0.3 - 1.2 mg/dL Final   GFR, Estimated 07/02/2022 >60  >60 mL/min Final   Comment: (NOTE) Calculated using the CKD-EPI Creatinine Equation (2021)    Anion gap 07/02/2022 8  5 - 15 Final   Performed at [name], [address] [place], [place] [address]   Sodium 07/01/2022 134 (L)  135 - 145 mmol/L Final   Potassium 07/01/2022 3.2 (L)  3.5 - 5.1 mmol/L Final   Chloride 07/01/2022 105  98 - 111 mmol/L Final   CO2 07/01/2022 23  22 - 32 mmol/L Final   Glucose, Bld 07/01/2022 109 (H)  70 - 99 mg/dL Final   Glucose reference range applies only to samples taken after fasting for at least 8 hours.   BUN 07/01/2022 15  6 - 20 mg/dL Final   Creatinine, Ser 07/01/2022 1.17  0.61 - 1.24 mg/dL Final   Calcium 29/52/8413 8.9  8.9 - 10.3 mg/dL Final   Total Protein 24/40/1027 6.1 (L)  6.5 - 8.1 g/dL Final   Albumin 25/36/6440 3.3 (L)  3.5 - 5.0 g/dL Final   AST 34/74/2595 609 (H)  15 - 41 U/L Final   ALT 07/01/2022 188 (H)  0 - 44 U/L Final   Alkaline Phosphatase 07/01/2022 81  38 - 126 U/L Final   Total Bilirubin 07/01/2022 1.1  0.3 - 1.2 mg/dL Final   GFR, Estimated 07/01/2022 >60  >60 mL/min Final   Comment: (NOTE) Calculated using the CKD-EPI Creatinine Equation (2021)    Anion gap 07/01/2022 6  5 - 15 Final   Performed at [facility], [address] [place], [place] [address]   Total CK 07/03/2022 11,164 (H)  49 - 397 U/L Final   Comment: RESULT CONFIRMED BY MANUAL DILUTION Performed at [name], [address], [place], [place] [address]    Sodium 07/03/2022 140  135 - 145 mmol/L Final   Potassium 07/03/2022 3.7  3.5 - 5.1 mmol/L Final   Chloride 07/03/2022 107  98 - 111 mmol/L Final   CO2 07/03/2022 26  22 - 32 mmol/L Final   Glucose, Bld 07/03/2022 122 (H)  70 - 99 mg/dL Final   Glucose reference range applies only to samples taken after fasting for at least 8 hours.   BUN 07/03/2022 9  6  - 20 mg/dL Final   Creatinine, Ser 07/03/2022 1.07  0.61 - 1.24 mg/dL Final   Calcium 95/18/8416 8.7 (L)  8.9 - 10.3 mg/dL Final   Total Protein 60/63/0160 5.3 (L)  6.5 - 8.1 g/dL Final   Albumin 10/93/2355 2.8 (L)  3.5 - 5.0 g/dL Final   AST 73/22/0254 292 (H)  15 - 41 U/L Final   ALT 07/03/2022 145 (H)  0 - 44 U/L Final   Alkaline Phosphatase 07/03/2022 67  38 -  126 U/L Final   Total Bilirubin 07/03/2022 0.4  0.3 - 1.2 mg/dL Final   GFR, Estimated 07/03/2022 >60  >60 mL/min Final   Comment: (NOTE) Calculated using the CKD-EPI Creatinine Equation (2021)    Anion gap 07/03/2022 7  5 - 15 Final   Performed at [name], [address], [place], [place] [address]   Total CK 07/04/2022 6,819 (H)  49 - 397 U/L Final   Comment: RESULT CONFIRMED BY MANUAL DILUTION Performed at [name], [address] [place], [place] [address]    Sodium 07/04/2022 138  135 - 145 mmol/L Final   Potassium 07/04/2022 3.7  3.5 - 5.1 mmol/L Final   Chloride 07/04/2022 102  98 - 111 mmol/L Final   CO2 07/04/2022 27  22 - 32 mmol/L Final   Glucose, Bld 07/04/2022 103 (H)  70 - 99 mg/dL Final   Glucose reference range applies only to samples taken after fasting for at least 8 hours.   BUN 07/04/2022 9  6 - 20 mg/dL Final   Creatinine, Ser 07/04/2022 0.95  0.61 - 1.24 mg/dL Final   Calcium 78/46/9629 9.3  8.9 - 10.3 mg/dL Final   Total Protein 52/84/1324 6.1 (L)  6.5 - 8.1 g/dL Final   Albumin 40/10/2723 3.2 (L)  3.5 - 5.0 g/dL Final   AST 36/64/4034 216 (H)  15 - 41 U/L Final   ALT 07/04/2022 143 (H)  0 - 44 U/L Final   Alkaline Phosphatase 07/04/2022 66  38 - 126 U/L Final   Total Bilirubin 07/04/2022 0.6  0.3 - 1.2 mg/dL Final   GFR, Estimated 07/04/2022 >60  >60 mL/min Final   Comment: (NOTE) Calculated using the CKD-EPI Creatinine Equation (2021)    Anion gap 07/04/2022 9  5 - 15 Final   Performed at [name], [address] [place], [place] [address]  Admission on  06/02/2022, Discharged on 06/03/2022  Component Date Value Ref Range Status   Sodium 06/02/2022 135  135 - 145 mmol/L Final   Potassium 06/02/2022 5.1  3.5 - 5.1 mmol/L Final   Chloride 06/02/2022 101  98 - 111 mmol/L Final   CO2 06/02/2022 20 (L)  22 - 32 mmol/L Final   Glucose, Bld 06/02/2022 110 (H)  70 - 99 mg/dL Final   Glucose reference range applies only to samples taken after fasting for at least 8 hours.   BUN 06/02/2022 30 (H)  6 - 20 mg/dL Final   Creatinine, Ser 06/02/2022 2.40 (H)  0.61 - 1.24 mg/dL Final   Calcium 56/38/7564 10.2  8.9 - 10.3 mg/dL Final   GFR, Estimated 06/02/2022 31 (L)  >60 mL/min Final   Comment: (NOTE) Calculated using the CKD-EPI Creatinine Equation (2021)    Anion gap 06/02/2022 14  5 - 15 Final   Performed at [name], [address], [place], [place] [address]   Troponin I (High Sensitivity) 06/02/2022 80 (H)  <18 ng/L Final   Comment: (NOTE) Elevated high sensitivity troponin I (hsTnI) values and significant  changes across serial measurements may suggest ACS but many other  chronic and acute conditions are known to elevate hsTnI results.  Refer to the "Links" section for chest pain algorithms and additional  guidance. Performed at [facility], [address], [place], [place] [address]    WBC 06/02/2022 11.4 (H)  4.0 - 10.5 K/uL Final   RBC 06/02/2022 5.07  4.22 - 5.81 MIL/uL  Final   Hemoglobin 06/02/2022 15.2  13.0 - 17.0 g/dL Final   HCT 42/70/6237 45.7  39.0 - 52.0 % Final   MCV 06/02/2022 90.1  80.0 - 100.0 fL Final   MCH 06/02/2022 30.0  26.0 - 34.0 pg Final   MCHC 06/02/2022 33.3  30.0 - 36.0 g/dL Final   RDW 62/83/1517 14.4  11.5 - 15.5 % Final   Platelets 06/02/2022 212  150 - 400 K/uL Final   nRBC 06/02/2022 0.0  0.0 - 0.2 % Final   Neutrophils Relative % 06/02/2022 81  % Final   Neutro Abs 06/02/2022 9.2 (H)  1.7 - 7.7 K/uL Final   Lymphocytes Relative 06/02/2022 12  % Final   Lymphs Abs 06/02/2022 1.4  0.7 -  4.0 K/uL Final   Monocytes Relative 06/02/2022 7  % Final   Monocytes Absolute 06/02/2022 0.8  0.1 - 1.0 K/uL Final   Eosinophils Relative 06/02/2022 0  % Final   Eosinophils Absolute 06/02/2022 0.0  0.0 - 0.5 K/uL Final   Basophils Relative 06/02/2022 0  % Final   Basophils Absolute 06/02/2022 0.0  0.0 - 0.1 K/uL Final   Immature Granulocytes 06/02/2022 0  % Final   Abs Immature Granulocytes 06/02/2022 0.02  0.00 - 0.07 K/uL Final   Performed at [facility], [address], [place], [place] [address]   Heparin Unfractionated 06/02/2022 0.24 (L)  0.30 - 0.70 IU/mL Final   Comment: (NOTE) The clinical reportable range upper limit is being lowered to >1.10 to align with the FDA approved guidance for the current laboratory assay.  If heparin results are below expected values, and patient dosage has  been confirmed, suggest follow up testing of antithrombin III levels. Performed at [name], [address] [place], [place] [address]    Troponin I (High Sensitivity) 06/02/2022 102 (HH)  <18 ng/L Final   Comment: CRITICAL RESULT CALLED TO, READ BACK BY AND VERIFIED WITH Real Cons. RN @ [phone] 06/02/22 BUTLER J. (NOTE) Elevated high sensitivity troponin I (hsTnI) values and significant  changes across serial measurements may suggest ACS but many other  chronic and acute conditions are known to elevate hsTnI results.  Refer to the "Links" section for chest pain algorithms and additional  guidance. Performed at [name], [address] [place], [place] [address]    Weight 06/02/2022 3,640  oz Final   Height 06/02/2022 74  in Final   BP 06/02/2022 129/87  mmHg Final   Single Plane A2C EF 06/02/2022 67.3  % Final   Single Plane A4C EF 06/02/2022 60.9  % Final   Calc EF 06/02/2022 63.4  % Final   S' Lateral 06/02/2022 2.80  cm Final   Area-P 1/2 06/02/2022 3.50  cm2 Final   Est EF 06/02/2022 55 - 60%   Final   Heparin Unfractionated 06/03/2022 0.26 (L)  0.30 -  0.70 IU/mL Final   Comment: (NOTE) The clinical reportable range upper limit is being lowered to >1.10 to align with the FDA approved guidance for the current laboratory assay.  If heparin results are below expected values, and patient dosage has  been confirmed, suggest follow up testing of antithrombin III levels. Performed at [name], [address], [name], [place] [address]    WBC 06/03/2022 8.3  4.0 - 10.5 K/uL Final   RBC 06/03/2022 4.79  4.22 - 5.81 MIL/uL Final   Hemoglobin 06/03/2022 14.6  13.0 - 17.0 g/dL Final  HCT 06/03/2022 43.9  39.0 - 52.0 % Final   MCV 06/03/2022 91.6  80.0 - 100.0 fL Final   MCH 06/03/2022 30.5  26.0 - 34.0 pg Final   MCHC 06/03/2022 33.3  30.0 - 36.0 g/dL Final   RDW 81/19/1478 14.5  11.5 - 15.5 % Final   Platelets 06/03/2022 170  150 - 400 K/uL Final   nRBC 06/03/2022 0.0  0.0 - 0.2 % Final   Performed at [name], [address] [place], [place] [address]   Sodium 06/03/2022 134 (L)  135 - 145 mmol/L Final   Potassium 06/03/2022 3.8  3.5 - 5.1 mmol/L Final   Chloride 06/03/2022 104  98 - 111 mmol/L Final   CO2 06/03/2022 22  22 - 32 mmol/L Final   Glucose, Bld 06/03/2022 98  70 - 99 mg/dL Final   Glucose reference range applies only to samples taken after fasting for at least 8 hours.   BUN 06/03/2022 30 (H)  6 - 20 mg/dL Final   Creatinine, Ser 06/03/2022 1.41 (H)  0.61 - 1.24 mg/dL Final   Calcium 13/08/6576 8.9  8.9 - 10.3 mg/dL Final   GFR, Estimated 06/03/2022 58 (L)  >60 mL/min Final   Comment: (NOTE) Calculated using the CKD-EPI Creatinine Equation (2021)    Anion gap 06/03/2022 8  5 - 15 Final   Performed at [facility], [address], [place], [place] [address]   Total CK 06/02/2022 3,240 (H)  49 - 397 U/L Final   Performed at [name], [address] [place], [place] [address]   Heparin Unfractionated 06/03/2022 0.26 (L)  0.30 - 0.70 IU/mL Final   Comment: (NOTE) The clinical reportable  range upper limit is being lowered to >1.10 to align with the FDA approved guidance for the current laboratory assay.  If heparin results are below expected values, and patient dosage has  been confirmed, suggest follow up testing of antithrombin III levels. Performed at [facility], [address], [place], [place] [address]   Admission on 03/31/2022, Discharged on 04/04/2022  Component Date Value Ref Range Status   Total Protein 04/02/2022 6.5  6.5 - 8.1 g/dL Final   Albumin 01/11/7251 3.5  3.5 - 5.0 g/dL Final   AST 66/44/0347 45 (H)  15 - 41 U/L Final   ALT 04/02/2022 44  0 - 44 U/L Final   Alkaline Phosphatase 04/02/2022 89  38 - 126 U/L Final   Total Bilirubin 04/02/2022 0.4  0.3 - 1.2 mg/dL Final   Bilirubin, Direct 04/02/2022 <0.1  0.0 - 0.2 mg/dL Final   Indirect Bilirubin 04/02/2022 NOT CALCULATED  0.3 - 0.9 mg/dL Final   Performed at [name], [address], [place], [place] [address]   Hepatitis B Surface Ag 04/02/2022 Reactive (A)  NON REACTIVE Final   Comment: Sample Positive for HBsAg. Result confirmed by neutralization. HEALTH DEPARTMENT NOTIFIED    HCV Ab 04/02/2022 NON REACTIVE  NON REACTIVE Final   Comment: (NOTE) Nonreactive HCV antibody screen is consistent with no HCV infections,  unless recent infection is suspected or other evidence exists to indicate HCV infection.     Hep A IgM 04/02/2022 NON REACTIVE  NON REACTIVE Final   Hep B C IgM 04/02/2022 NON REACTIVE  NON REACTIVE Final   Performed at [facility], [address], [place], [place] [address]   Cholesterol 04/02/2022 138  0 - 200 mg/dL Final  Triglycerides 04/02/2022 258 (H)  <150 mg/dL Final   HDL 38/75/6433 35 (L)  >40 mg/dL Final   Total CHOL/HDL Ratio 04/02/2022 3.9  RATIO Final   VLDL 04/02/2022 52 (H)  0 - 40 mg/dL Final   LDL Cholesterol 04/02/2022 51  0 - 99 mg/dL Final   Comment:        Total Cholesterol/HDL:CHD Risk Coronary Heart Disease Risk  Table                     Men   Women  1/2 Average Risk   3.4   3.3  Average Risk       5.0   4.4  2 X Average Risk   9.6   7.1  3 X Average Risk  23.4   11.0        Use the calculated Patient Ratio above and the CHD Risk Table to determine the patient's CHD Risk.        ATP III CLASSIFICATION (LDL):  <100     mg/dL   Optimal  295-188  mg/dL   Near or Above                    Optimal  130-159  mg/dL   Borderline  416-606  mg/dL   High  >301     mg/dL   Very High Performed at [facility], [address] [place], [place] [address]    Hgb A1c MFr Bld 04/03/2022 6.2 (H)  4.8 - 5.6 % Final   Comment: (NOTE)         Prediabetes: 5.7 - 6.4         Diabetes: >6.4         Glycemic control for adults with diabetes: <7.0    Mean Plasma Glucose 04/03/2022 131  mg/dL Final   Comment: (NOTE) Performed At: [name] [address] [place], [place] [id number] Jolene Schimke MD [id number]   Admission on 03/30/2022, Discharged on 03/31/2022  Component Date Value Ref Range Status   SARS Coronavirus 2 by RT PCR 03/30/2022 NEGATIVE  NEGATIVE Final   Influenza A by PCR 03/30/2022 NEGATIVE  NEGATIVE Final   Influenza B by PCR 03/30/2022 NEGATIVE  NEGATIVE Final   Comment: (NOTE) The Xpert Xpress SARS-CoV-2/FLU/RSV plus assay is intended as an aid in the diagnosis of influenza from Nasopharyngeal swab specimens and should not be used as a sole basis for treatment. Nasal washings and aspirates are unacceptable for Xpert Xpress SARS-CoV-2/FLU/RSV testing.  Fact Sheet for Patients: BloggerCourse.com  Fact Sheet for Healthcare Providers: SeriousBroker.it  This test is not yet approved or cleared by the [place] FDA and has been authorized for detection and/or diagnosis of SARS-CoV-2 by FDA under an Emergency Use Authorization (EUA). This EUA will remain in effect (meaning this test can be used) for the  duration of the COVID-19 declaration under Section 564(b)(1) of the Act, 21 U.S.C. section 360bbb-3(b)(1), unless the authorization is terminated or revoked.     Resp Syncytial Virus by PCR 03/30/2022 NEGATIVE  NEGATIVE Final   Comment: (NOTE) Fact Sheet for Patients: BloggerCourse.com  Fact Sheet for Healthcare Providers: SeriousBroker.it  This test is not yet approved or cleared by the [place] FDA and has been authorized for detection and/or diagnosis of SARS-CoV-2 by FDA under an Emergency Use Authorization (EUA). This EUA will remain in effect (meaning this test can be used) for the duration of the COVID-19 declaration under Section 564(b)(1)  of the Act, 21 U.S.C. section 360bbb-3(b)(1), unless the authorization is terminated or revoked.  Performed at [facility], [address], [place], [place] [address]    WBC 03/30/2022 8.7  4.0 - 10.5 K/uL Final   RBC 03/30/2022 5.03  4.22 - 5.81 MIL/uL Final   Hemoglobin 03/30/2022 15.0  13.0 - 17.0 g/dL Final   HCT 19/14/7829 45.2  39.0 - 52.0 % Final   MCV 03/30/2022 89.9  80.0 - 100.0 fL Final   MCH 03/30/2022 29.8  26.0 - 34.0 pg Final   MCHC 03/30/2022 33.2  30.0 - 36.0 g/dL Final   RDW 56/21/3086 13.2  11.5 - 15.5 % Final   Platelets 03/30/2022 188  150 - 400 K/uL Final   nRBC 03/30/2022 0.0  0.0 - 0.2 % Final   Neutrophils Relative % 03/30/2022 60  % Final   Neutro Abs 03/30/2022 5.2  1.7 - 7.7 K/uL Final   Lymphocytes Relative 03/30/2022 29  % Final   Lymphs Abs 03/30/2022 2.5  0.7 - 4.0 K/uL Final   Monocytes Relative 03/30/2022 10  % Final   Monocytes Absolute 03/30/2022 0.9  0.1 - 1.0 K/uL Final   Eosinophils Relative 03/30/2022 1  % Final   Eosinophils Absolute 03/30/2022 0.1  0.0 - 0.5 K/uL Final   Basophils Relative 03/30/2022 0  % Final   Basophils Absolute 03/30/2022 0.0  0.0 - 0.1 K/uL Final   Immature Granulocytes 03/30/2022 0  % Final   Abs  Immature Granulocytes 03/30/2022 0.02  0.00 - 0.07 K/uL Final   Performed at [name], [address] [place], [place] [address]   Sodium 03/30/2022 134 (L)  135 - 145 mmol/L Final   Potassium 03/30/2022 3.7  3.5 - 5.1 mmol/L Final   Chloride 03/30/2022 96 (L)  98 - 111 mmol/L Final   CO2 03/30/2022 25  22 - 32 mmol/L Final   Glucose, Bld 03/30/2022 74  70 - 99 mg/dL Final   Glucose reference range applies only to samples taken after fasting for at least 8 hours.   BUN 03/30/2022 17  6 - 20 mg/dL Final   Creatinine, Ser 03/30/2022 1.09  0.61 - 1.24 mg/dL Final   Calcium 96/29/5284 10.2  8.9 - 10.3 mg/dL Final   Total Protein 13/24/4010 8.1  6.5 - 8.1 g/dL Final   Albumin 27/25/3664 4.8  3.5 - 5.0 g/dL Final   AST 40/34/7425 99 (H)  15 - 41 U/L Final   ALT 03/30/2022 50 (H)  0 - 44 U/L Final   Alkaline Phosphatase 03/30/2022 119  38 - 126 U/L Final   Total Bilirubin 03/30/2022 1.0  0.3 - 1.2 mg/dL Final   GFR, Estimated 03/30/2022 >60  >60 mL/min Final   Comment: (NOTE) Calculated using the CKD-EPI Creatinine Equation (2021)    Anion gap 03/30/2022 13  5 - 15 Final   Performed at [name], [address], [place], [place] [address]   Magnesium 03/30/2022 2.5 (H)  1.7 - 2.4 mg/dL Final   Performed at [facility], [address] [place], [place] [address]   Alcohol, Ethyl (B) 03/30/2022 22 (H)  <10 mg/dL Final   Comment: (NOTE) Lowest detectable limit for serum alcohol is 10 mg/dL.  For medical purposes only. Performed at [name], [address], [place], [place] [address]    Prolactin 03/30/2022 4.8  3.6 - 25.2 ng/mL Final   Comment: (NOTE) Performed At: [facility] [address] [place], [place] [id number] Jolene Schimke MD [id number]    SARSCOV2ONAVIRUS 2 AG 03/30/2022 NEGATIVE  NEGATIVE Final   Comment: (NOTE) SARS-CoV-2 antigen NOT DETECTED.   Negative results are presumptive.  Negative results do not  preclude SARS-CoV-2 infection and should not be used as the sole basis for treatment or other patient management decisions, including infection  control decisions, particularly in the presence of clinical signs and  symptoms consistent with COVID-19, or in those who have been in contact with the virus.  Negative results must be combined with clinical observations, patient history, and epidemiological information. The expected result is Negative.  Fact Sheet for Patients: https://www.jennings-kim.com/  Fact Sheet for Healthcare Providers: https://alexander-rogers.biz/  This test is not yet approved or cleared by the [place] FDA and  has been authorized for detection and/or diagnosis of SARS-CoV-2 by FDA under an Emergency Use Authorization (EUA).  This EUA will remain in effect (meaning this test can be used) for the duration of  the COV                          ID-19 declaration under Section 564(b)(1) of the Act, 21 U.S.C. section 360bbb-3(b)(1), unless the authorization is terminated or revoked sooner.     TSH 03/30/2022 1.014  0.350 - 4.500 uIU/mL Final   Comment: Performed by a 3rd Generation assay with a functional sensitivity of <=0.01 uIU/mL. Performed at [name], [address] [place], [place] [address]     Allergies: Penicillins, Pork-derived products, Latex, and Tape  Medications:  Facility Ordered Medications  Medication   nicotine (NICODERM CQ - dosed in mg/24 hours) patch 14 mg   acetaminophen (TYLENOL) tablet 650 mg   alum & mag hydroxide-simeth (MAALOX/MYLANTA) 200-200-20 MG/5ML suspension 30 mL   magnesium hydroxide (MILK OF MAGNESIA) suspension 30 mL   hydrOXYzine (ATARAX) tablet 25 mg   traZODone (DESYREL) tablet 50 mg   aspirin EC tablet 81 mg   bictegravir-emtricitabine-tenofovir AF (BIKTARVY) 50-200-25 MG per tablet 1 tablet   gabapentin (NEURONTIN) capsule 600 mg   pantoprazole (PROTONIX) EC tablet 40 mg    QUEtiapine (SEROQUEL) tablet 150 mg   PTA Medications  Medication Sig   bictegravir-emtricitabine-tenofovir AF (BIKTARVY) 50-200-25 MG TABS tablet Take 1 tablet by mouth daily.   omeprazole (PRILOSEC) 40 MG capsule Take 1 capsule (40 mg total) by mouth daily.   QUEtiapine (SEROQUEL) 300 MG tablet Take 1 tablet (300 mg total) by mouth at bedtime.   traZODone (DESYREL) 50 MG tablet Take 1 tablet (50 mg total) by mouth at bedtime as needed for sleep.   aspirin EC 81 MG tablet Take 1 tablet (81 mg total) by mouth daily. Swallow whole.   sildenafil (VIAGRA) 100 MG tablet Take 1 tablet (100 mg total) by mouth daily as needed for erectile dysfunction. At least 24 hours between doses.   nicotine polacrilex (NICORETTE) 4 MG gum Take 4 mg by mouth as needed for smoking cessation.   gabapentin (NEURONTIN) 300 MG capsule Take 2 capsules (600 mg total) by mouth 3 (three) times daily.      Medical Decision Making  Patient remains voluntary.  He will be admitted to [facility] behavioral health continuous observation while awaiting inpatient psychiatric admission.  Laboratory studies ordered including CBC, CMP, ethanol, magnesium, prolactin and TSH.  Urine drug screen order initiated.  EKG ordered.   Current medications: -Acetaminophen 650 mg every 6 as needed/mild pain -Maalox  30 mL oral every 4 as needed/digestion -Hydroxyzine 25 mg 3 times daily as needed/anxiety -Magnesium hydroxide 30 mL daily as needed/mild constipation -Trazodone 50 mg nightly as needed/sleep -NicoDerm 14 mg transdermal patch daily/nicotine withdrawal  Prior to admission medications initiated: -Aspirin 81 mg daily -Bictegravir-emtricitabine-tenofovir Biktarvy 50-200-25 mg 1 tablet daily-gabapentin 600 mg 3 times daily -Gabapentin 600 mg 3 times daily -Pantoprazole EC 40 mg daily -Quetiapine 150 mg nightly      Recommendations  Based on my evaluation the patient does not appear to have an emergency medical  condition.  Lenard Lance, FNP 08/25/22  6:51 PM

## 2022-08-25 NOTE — ED Notes (Signed)
Pt A&O x 4, no distress noted, sullen calm & cooperative.  Monitoring for safety.
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
## 2022-08-25 NOTE — ED Notes (Signed)
Pt sleeping at present, no distress noted,  Monitoring for safety. 

## 2022-08-25 NOTE — ED Notes (Addendum)
Patient was admitted to the observation unit. Patient denies HI but reported he wanted to cut his right arm off. Patient reports he is hearing voices. Patient reports he will contract for safety. Patient denies visual hallucinations. Patient is being monitored for safety.

## 2022-08-26 ENCOUNTER — Encounter (HOSPITAL_COMMUNITY): Payer: Self-pay | Admitting: Registered Nurse

## 2022-08-26 DIAGNOSIS — R45851 Suicidal ideations: Secondary | ICD-10-CM

## 2022-08-26 DIAGNOSIS — F3130 Bipolar disorder, current episode depressed, mild or moderate severity, unspecified: Secondary | ICD-10-CM | POA: Diagnosis present

## 2022-08-26 NOTE — ED Notes (Signed)
 Patient alert and oriented x 3. Denies SI/HI/ endorse AVH. Denies intent or plan to harm self or others. Routine conducted according to faculty protocol. Encourage patient to notify staff with any needs or concerns. Patient verbalized agreement and understanding. Will continue to monitor for safety.

## 2022-08-26 NOTE — ED Provider Notes (Signed)
Behavioral Health Progress Note  Date and Time: 08/26/2022 12:25 PM Name: Dylan [name] MRN:  [id number]  Subjective:  Admitted to continuous assessment while awaiting appropriate bed for inpatient psychiatric treatment after initially presenting voluntarily to [facility] behavioral health via law enforcement with complaints that police were called to local bar after patient asked bartender for a knife so that he could cut off his arm.    [name] reassessed face-to-face by this provider, chart reviewed, and consulted with Dr. Jannifer Franklin  on 08/26/22 On evaluation, Dylan [name] is lying in bed with his arm bent at elbow and forearm covering his eyes.  There is no eye contact throughout assessment.  There is no noted distress.   His speech is clear, coherent, normal rate and volume. His mood is depressed with congruent affect. He denies homicidal ideation, psychosis, and paranoia but continues to endorse suicidal ideation.  Objectively there is no evidence of psychosis/mania or delusional thinking.  His responses to assessment questions were relevant and appropriate but liner.  He conversed coherently, with goal directed thoughts, no distractibility, or pre-occupation.  Will continue to recommend inpatient psychiatric treatment   Diagnosis:  Final diagnoses:  Bipolar I disorder, most recent episode depressed (HCC)  Polysubstance abuse (HCC)  Suicidal ideation    Total Time spent with patient: 30 minutes  Past Psychiatric History: generalized anxiety disorder, bipolar 1 disorder, MDD, suicidal ideation, substance induced mood disorder, methamphetamine abuse, cocaine use disorder, PTSD, alcohol use disorder, polysubstance abuse  Past Medical History:  Past Medical History:  Diagnosis Date   Anxiety    Arthritis    Bipolar 1 disorder (HCC)    Colon polyps    Depression    GERD (gastroesophageal reflux disease)    Hepatitis B    Human immunodeficiency virus (HIV) (HCC)     Hyperlipidemia    Hypertension    Insomnia due to other mental disorder 02/03/2020   Intentional drug overdose (HCC) 11/26/2021   Neuromuscular disorder (HCC)    neuropathy   Neuropathy    Pre-diabetes    Rhabdomyolysis 02/19/2017   Unilateral primary osteoarthritis, left knee 12/14/2021    Family History:  Family History  Problem Relation Age of Onset   CAD Mother    Diabetes Father    Stroke Father    Colon cancer Maternal Aunt 60   Diabetes Maternal Aunt    Heart disease Maternal Uncle    Heart attack Maternal Uncle    Esophageal cancer Neg Hx    Rectal cancer Neg Hx    Stomach cancer Neg Hx     Family Psychiatric  History: Unaware Social History:  Social History   Tobacco Use   Smoking status: Former    Current packs/day: 0.50    Average packs/day: 0.5 packs/day for 25.0 years (12.5 ttl pk-yrs)    Types: E-cigarettes, Cigarettes   Smokeless tobacco: Never   Tobacco comments:    Vapes daily  Vaping Use   Vaping status: Every Day   Start date: 12/09/2020   Substances: Nicotine  Substance Use Topics   Alcohol use: Yes    Comment: last drink March 2024   Drug use: Not Currently    Types: Cocaine, Marijuana    Comment: Last used cocaine March 2024; last used cannabis 2022     Additional Social History:    Pain Medications: See MAR Prescriptions: See MAR Over the Counter: See MAR History of alcohol / drug use?: Yes Longest period of sobriety (when/how long): 2- 3 months -  occasional ETOH and Cocaine use Negative Consequences of Use: Financial, Legal Withdrawal Symptoms: Aggressive/Assaultive, Agitation Name of Substance 1: Alcohol 1 - Age of First Use: 14 1 - Amount (size/oz): 16 1 - Frequency: less than once a week 1 - Duration: ongoing 1 - Last Use / Amount: last night 1 - Method of Aquiring: puchase 1- Route of Use: drink Name of Substance 2: THC 2 - Age of First Use: 57 2 - Amount (size/oz): uknown 2 - Frequency: couple time a month 2 - Duration:  ongoing 2 - Last Use / Amount: a week ago 2 - Method of Aquiring: purchased at a smoke shop 2 - Route of Substance Use: vape Name of Substance 3: cocaine 3 - Age of First Use: 32 3 - Amount (size/oz): $20's worth 3 - Frequency: unknown 3 - Duration: ongoing 3 - Last Use / Amount: 4 days ago 3 - Method of Aquiring: purchase              Sleep: Good  Appetite:  Good  Current Medications:  Current Facility-Administered Medications  Medication Dose Route Frequency Provider Last Rate Last Admin   acetaminophen (TYLENOL) tablet 650 mg  650 mg Oral Q6H PRN Lenard Lance, FNP       alum & mag hydroxide-simeth (MAALOX/MYLANTA) 200-200-20 MG/5ML suspension 30 mL  30 mL Oral Q4H PRN Lenard Lance, FNP       aspirin EC tablet 81 mg  81 mg Oral Daily Lenard Lance, FNP   81 mg at 08/26/22 0933   bictegravir-emtricitabine-tenofovir AF (BIKTARVY) 50-200-25 MG per tablet 1 tablet  1 tablet Oral Daily Lenard Lance, FNP   1 tablet at 08/26/22 0933   fluticasone (FLONASE) 50 MCG/ACT nasal spray 2 spray  2 spray Each Nare Daily PRN Ajibola, Ene A, NP   2 spray at 08/26/22 0006   gabapentin (NEURONTIN) capsule 600 mg  600 mg Oral TID Lenard Lance, FNP   600 mg at 08/26/22 0933   hydrOXYzine (ATARAX) tablet 25 mg  25 mg Oral TID PRN Lenard Lance, FNP       magnesium hydroxide (MILK OF MAGNESIA) suspension 30 mL  30 mL Oral Daily PRN Lenard Lance, FNP       nicotine (NICODERM CQ - dosed in mg/24 hours) patch 14 mg  14 mg Transdermal Daily Lenard Lance, FNP   14 mg at 08/26/22 0933   pantoprazole (PROTONIX) EC tablet 40 mg  40 mg Oral Daily Lenard Lance, FNP   40 mg at 08/26/22 0933   QUEtiapine (SEROQUEL) tablet 150 mg  150 mg Oral QHS Lenard Lance, FNP   150 mg at 08/25/22 2120   traZODone (DESYREL) tablet 50 mg  50 mg Oral QHS PRN Lenard Lance, FNP       Current Outpatient Medications  Medication Sig Dispense Refill   aspirin EC 81 MG tablet Take 1 tablet (81 mg total) by mouth daily.  Swallow whole. 30 tablet 12   bictegravir-emtricitabine-tenofovir AF (BIKTARVY) 50-200-25 MG TABS tablet Take 1 tablet by mouth daily. 30 tablet 11   gabapentin (NEURONTIN) 300 MG capsule Take 2 capsules (600 mg total) by mouth 3 (three) times daily. 180 capsule 1   nicotine polacrilex (NICORETTE) 4 MG gum Take 4 mg by mouth as needed for smoking cessation.     omeprazole (PRILOSEC) 40 MG capsule Take 1 capsule (40 mg total) by mouth daily. 30 capsule 3   QUEtiapine (SEROQUEL)  300 MG tablet Take 1 tablet (300 mg total) by mouth at bedtime. 30 tablet 2   sildenafil (VIAGRA) 100 MG tablet Take 1 tablet (100 mg total) by mouth daily as needed for erectile dysfunction. At least 24 hours between doses. 10 tablet 2   traZODone (DESYREL) 50 MG tablet Take 1 tablet (50 mg total) by mouth at bedtime as needed for sleep. 30 tablet 2    Labs  Lab Results:  Admission on 08/25/2022  Component Date Value Ref Range Status   WBC 08/25/2022 6.2  4.0 - 10.5 K/uL Final   RBC 08/25/2022 4.77  4.22 - 5.81 MIL/uL Final   Hemoglobin 08/25/2022 14.3  13.0 - 17.0 g/dL Final   HCT 24/40/1027 43.3  39.0 - 52.0 % Final   MCV 08/25/2022 90.8  80.0 - 100.0 fL Final   MCH 08/25/2022 30.0  26.0 - 34.0 pg Final   MCHC 08/25/2022 33.0  30.0 - 36.0 g/dL Final   RDW 25/36/6440 13.0  11.5 - 15.5 % Final   Platelets 08/25/2022 201  150 - 400 K/uL Final   nRBC 08/25/2022 0.0  0.0 - 0.2 % Final   Neutrophils Relative % 08/25/2022 67  % Final   Neutro Abs 08/25/2022 4.1  1.7 - 7.7 K/uL Final   Lymphocytes Relative 08/25/2022 25  % Final   Lymphs Abs 08/25/2022 1.5  0.7 - 4.0 K/uL Final   Monocytes Relative 08/25/2022 8  % Final   Monocytes Absolute 08/25/2022 0.5  0.1 - 1.0 K/uL Final   Eosinophils Relative 08/25/2022 0  % Final   Eosinophils Absolute 08/25/2022 0.0  0.0 - 0.5 K/uL Final   Basophils Relative 08/25/2022 0  % Final   Basophils Absolute 08/25/2022 0.0  0.0 - 0.1 K/uL Final   Immature Granulocytes 08/25/2022 0   % Final   Abs Immature Granulocytes 08/25/2022 0.02  0.00 - 0.07 K/uL Final   Performed at [facility], [address], [place], [place] [address]   Sodium 08/25/2022 137  135 - 145 mmol/L Final   Potassium 08/25/2022 3.6  3.5 - 5.1 mmol/L Final   Chloride 08/25/2022 103  98 - 111 mmol/L Final   CO2 08/25/2022 24  22 - 32 mmol/L Final   Glucose, Bld 08/25/2022 90  70 - 99 mg/dL Final   Glucose reference range applies only to samples taken after fasting for at least 8 hours.   BUN 08/25/2022 14  6 - 20 mg/dL Final   Creatinine, Ser 08/25/2022 1.12  0.61 - 1.24 mg/dL Final   Calcium 59/56/3875 9.7  8.9 - 10.3 mg/dL Final   Total Protein 64/33/2951 8.3 (H)  6.5 - 8.1 g/dL Final   Albumin 88/41/6606 4.6  3.5 - 5.0 g/dL Final   AST 30/16/0109 42 (H)  15 - 41 U/L Final   ALT 08/25/2022 43  0 - 44 U/L Final   Alkaline Phosphatase 08/25/2022 91  38 - 126 U/L Final   Total Bilirubin 08/25/2022 0.9  0.3 - 1.2 mg/dL Final   GFR, Estimated 08/25/2022 >60  >60 mL/min Final   Comment: (NOTE) Calculated using the CKD-EPI Creatinine Equation (2021)    Anion gap 08/25/2022 10  5 - 15 Final   Performed at [facility], [address] [place], [place] [address]   Magnesium 08/25/2022 2.5 (H)  1.7 - 2.4 mg/dL Final   Performed at [facility] [place] [facility], [address] [place], [place] [address]   Alcohol, Ethyl (B)  08/25/2022 <10  <10 mg/dL Final   Comment: (NOTE) Lowest detectable limit for serum alcohol is 10 mg/dL.  For medical purposes only. Performed at [facility], [address] [place], [place] [address]    TSH 08/25/2022 0.667  0.350 - 4.500 uIU/mL Final   Comment: Performed by a 3rd Generation assay with a functional sensitivity of <=0.01 uIU/mL. Performed at [facility], [address], [place], [place] [address]    POC Amphetamine UR 08/25/2022 None Detected  NONE DETECTED (Cut Off Level 1000 ng/mL) Final   POC Secobarbital (BAR) 08/25/2022 None Detected   NONE DETECTED (Cut Off Level 300 ng/mL) Final   POC Buprenorphine (BUP) 08/25/2022 None Detected  NONE DETECTED (Cut Off Level 10 ng/mL) Final   POC Oxazepam (BZO) 08/25/2022 None Detected  NONE DETECTED (Cut Off Level 300 ng/mL) Final   POC Cocaine UR 08/25/2022 Positive (A)  NONE DETECTED (Cut Off Level 300 ng/mL) Final   POC Methamphetamine UR 08/25/2022 None Detected  NONE DETECTED (Cut Off Level 1000 ng/mL) Final   POC Morphine 08/25/2022 None Detected  NONE DETECTED (Cut Off Level 300 ng/mL) Final   POC Methadone UR 08/25/2022 None Detected  NONE DETECTED (Cut Off Level 300 ng/mL) Final   POC Oxycodone UR 08/25/2022 None Detected  NONE DETECTED (Cut Off Level 100 ng/mL) Final   POC Marijuana UR 08/25/2022 Positive (A)  NONE DETECTED (Cut Off Level 50 ng/mL) Final  Admission on 07/01/2022, Discharged on 07/04/2022  Component Date Value Ref Range Status   WBC 07/01/2022 10.5  4.0 - 10.5 K/uL Final   RBC 07/01/2022 4.81  4.22 - 5.81 MIL/uL Final   Hemoglobin 07/01/2022 14.6  13.0 - 17.0 g/dL Final   HCT 13/24/4010 43.4  39.0 - 52.0 % Final   MCV 07/01/2022 90.2  80.0 - 100.0 fL Final   MCH 07/01/2022 30.4  26.0 - 34.0 pg Final   MCHC 07/01/2022 33.6  30.0 - 36.0 g/dL Final   RDW 27/25/3664 13.9  11.5 - 15.5 % Final   Platelets 07/01/2022 193  150 - 400 K/uL Final   nRBC 07/01/2022 0.0  0.0 - 0.2 % Final   Neutrophils Relative % 07/01/2022 67  % Final   Neutro Abs 07/01/2022 7.0  1.7 - 7.7 K/uL Final   Lymphocytes Relative 07/01/2022 22  % Final   Lymphs Abs 07/01/2022 2.3  0.7 - 4.0 K/uL Final   Monocytes Relative 07/01/2022 11  % Final   Monocytes Absolute 07/01/2022 1.1 (H)  0.1 - 1.0 K/uL Final   Eosinophils Relative 07/01/2022 0  % Final   Eosinophils Absolute 07/01/2022 0.0  0.0 - 0.5 K/uL Final   Basophils Relative 07/01/2022 0  % Final   Basophils Absolute 07/01/2022 0.0  0.0 - 0.1 K/uL Final   Immature Granulocytes 07/01/2022 0  % Final   Abs Immature Granulocytes  07/01/2022 0.02  0.00 - 0.07 K/uL Final   Performed at [facility], [address] [place], [place] [address]   Sodium 07/01/2022 135  135 - 145 mmol/L Final   Potassium 07/01/2022 3.7  3.5 - 5.1 mmol/L Final   Chloride 07/01/2022 99  98 - 111 mmol/L Final   CO2 07/01/2022 21 (L)  22 - 32 mmol/L Final   Glucose, Bld 07/01/2022 93  70 - 99 mg/dL Final   Glucose reference range applies only to samples taken after fasting for at least 8 hours.   BUN 07/01/2022 17  6 -  20 mg/dL Final   Creatinine, Ser 07/01/2022 1.51 (H)  0.61 - 1.24 mg/dL Final   Calcium 82/95/6213 9.5  8.9 - 10.3 mg/dL Final   Total Protein 08/65/7846 7.8  6.5 - 8.1 g/dL Final   Albumin 96/29/5284 4.4  3.5 - 5.0 g/dL Final   AST 13/24/4010 706 (H)  15 - 41 U/L Final   ALT 07/01/2022 203 (H)  0 - 44 U/L Final   Alkaline Phosphatase 07/01/2022 88  38 - 126 U/L Final   Total Bilirubin 07/01/2022 1.1  0.3 - 1.2 mg/dL Final   GFR, Estimated 07/01/2022 54 (L)  >60 mL/min Final   Comment: (NOTE) Calculated using the CKD-EPI Creatinine Equation (2021)    Anion gap 07/01/2022 15  5 - 15 Final   Performed at [facility], [address], [place], [place] [address]   Total CK 07/01/2022 39,211 (H)  49 - 397 U/L Final   Comment: RESULT CONFIRMED BY MANUAL DILUTION Performed at [facility], [address], [place], [place] [address]    Specimen Source 07/01/2022 URINE, CLEAN CATCH   Final   Color, Urine 07/01/2022 YELLOW  YELLOW Final   APPearance 07/01/2022 HAZY (A)  CLEAR Final   Specific Gravity, Urine 07/01/2022 1.004 (L)  1.005 - 1.030 Final   pH 07/01/2022 6.0  5.0 - 8.0 Final   Glucose, UA 07/01/2022 NEGATIVE  NEGATIVE mg/dL Final   Hgb urine dipstick 07/01/2022 LARGE (A)  NEGATIVE Final   Bilirubin Urine 07/01/2022 NEGATIVE  NEGATIVE Final   Ketones, ur 07/01/2022 NEGATIVE  NEGATIVE mg/dL Final   Protein, ur 34/74/2595 30 (A)  NEGATIVE mg/dL Final   Nitrite 63/87/5643 NEGATIVE  NEGATIVE Final    Leukocytes,Ua 07/01/2022 MODERATE (A)  NEGATIVE Final   RBC / HPF 07/01/2022 0-5  0 - 5 RBC/hpf Final   WBC, UA 07/01/2022 >50  0 - 5 WBC/hpf Final   Comment:        Reflex urine culture not performed if WBC <=10, OR if Squamous epithelial cells >5. If Squamous epithelial cells >5 suggest recollection.    Bacteria, UA 07/01/2022 RARE (A)  NONE SEEN Final   Squamous Epithelial / HPF 07/01/2022 0-5  0 - 5 /HPF Final   Mucus 07/01/2022 PRESENT   Final   Performed at [facility], [address], [place], [place] [address]   Specimen Description 07/01/2022 URINE, RANDOM   Final   Special Requests 07/01/2022 URINE, CLEAN CATCH   Final   Culture 07/01/2022    Final                   Value:NO GROWTH Performed at [facility], [address] [place], [place] [address]    Report Status 07/01/2022 07/02/2022 FINAL   Final   Acetaminophen (Tylenol), Serum 07/01/2022 <10 (L)  10 - 30 ug/mL Final   Comment: (NOTE) Therapeutic concentrations vary significantly. A range of 10-30 ug/mL  may be an effective concentration for many patients. However, some  are best treated at concentrations outside of this range. Acetaminophen concentrations >150 ug/mL at 4 hours after ingestion  and >50 ug/mL at 12 hours after ingestion are often associated with  toxic reactions.  Performed at [facility], [address] [place], [place] [address]    Hepatitis B Surface Ag 07/01/2022 Reactive (A)  NON REACTIVE Final   Sample Positive for HBsAg. Result confirmed by neutralization.   HCV Ab 07/01/2022 NON REACTIVE  NON REACTIVE Final   Comment: (NOTE) Nonreactive HCV antibody screen is consistent with no HCV infections,  unless recent infection is suspected or other evidence exists to indicate HCV infection.     Hep A IgM 07/01/2022 NON REACTIVE  NON REACTIVE Final   Hep B C IgM 07/01/2022 NON REACTIVE  NON REACTIVE Final   Performed at [facility], [address], [name],  [place] [address]   Total CK 07/02/2022 23,430 (H)  49 - 397 U/L Final   Comment: RESULT CONFIRMED BY MANUAL DILUTION Performed at [facility], [address], [place], [place] [address]    Sodium 07/02/2022 137  135 - 145 mmol/L Final   Potassium 07/02/2022 3.6  3.5 - 5.1 mmol/L Final   Chloride 07/02/2022 105  98 - 111 mmol/L Final   CO2 07/02/2022 24  22 - 32 mmol/L Final   Glucose, Bld 07/02/2022 109 (H)  70 - 99 mg/dL Final   Glucose reference range applies only to samples taken after fasting for at least 8 hours.   BUN 07/02/2022 14  6 - 20 mg/dL Final   Creatinine, Ser 07/02/2022 1.24  0.61 - 1.24 mg/dL Final   Calcium 23/55/7322 9.0  8.9 - 10.3 mg/dL Final   Total Protein 02/54/2706 5.9 (L)  6.5 - 8.1 g/dL Final   Albumin 23/76/2831 3.2 (L)  3.5 - 5.0 g/dL Final   AST 51/76/1607 526 (H)  15 - 41 U/L Final   ALT 07/02/2022 180 (H)  0 - 44 U/L Final   Alkaline Phosphatase 07/02/2022 74  38 - 126 U/L Final   Total Bilirubin 07/02/2022 0.8  0.3 - 1.2 mg/dL Final   GFR, Estimated 07/02/2022 >60  >60 mL/min Final   Comment: (NOTE) Calculated using the CKD-EPI Creatinine Equation (2021)    Anion gap 07/02/2022 8  5 - 15 Final   Performed at [facility], [address], [place], [place] [address]   Sodium 07/01/2022 134 (L)  135 - 145 mmol/L Final   Potassium 07/01/2022 3.2 (L)  3.5 - 5.1 mmol/L Final   Chloride 07/01/2022 105  98 - 111 mmol/L Final   CO2 07/01/2022 23  22 - 32 mmol/L Final   Glucose, Bld 07/01/2022 109 (H)  70 - 99 mg/dL Final   Glucose reference range applies only to samples taken after fasting for at least 8 hours.   BUN 07/01/2022 15  6 - 20 mg/dL Final   Creatinine, Ser 07/01/2022 1.17  0.61 - 1.24 mg/dL Final   Calcium 26/94/8546 8.9  8.9 - 10.3 mg/dL Final   Total Protein 27/03/5007 6.1 (L)  6.5 - 8.1 g/dL Final   Albumin 38/18/2993 3.3 (L)  3.5 - 5.0 g/dL Final   AST 71/69/6789 609 (H)  15 - 41 U/L Final   ALT 07/01/2022 188 (H)  0 - 44 U/L Final    Alkaline Phosphatase 07/01/2022 81  38 - 126 U/L Final   Total Bilirubin 07/01/2022 1.1  0.3 - 1.2 mg/dL Final   GFR, Estimated 07/01/2022 >60  >60 mL/min Final   Comment: (NOTE) Calculated using the CKD-EPI Creatinine Equation (2021)    Anion gap 07/01/2022 6  5 - 15 Final   Performed at [facility], [address], [place], [place] [address]   Total CK 07/03/2022 11,164 (H)  49 - 397 U/L Final   Comment: RESULT CONFIRMED BY MANUAL DILUTION Performed at [facility], [address], [place], [place] [address]    Sodium 07/03/2022 140  135 - 145 mmol/L Final   Potassium 07/03/2022 3.7  3.5 - 5.1 mmol/L Final   Chloride 07/03/2022 107  98 - 111 mmol/L Final   CO2 07/03/2022 26  22 - 32 mmol/L Final   Glucose, Bld 07/03/2022 122 (H)  70 - 99 mg/dL Final   Glucose reference range applies only to samples taken after fasting for at least 8 hours.   BUN 07/03/2022 9  6 - 20 mg/dL Final   Creatinine, Ser 07/03/2022 1.07  0.61 - 1.24 mg/dL Final   Calcium 62/13/0865 8.7 (L)  8.9 - 10.3 mg/dL Final   Total Protein 78/46/9629 5.3 (L)  6.5 - 8.1 g/dL Final   Albumin 52/84/1324 2.8 (L)  3.5 - 5.0 g/dL Final   AST 40/10/2723 292 (H)  15 - 41 U/L Final   ALT 07/03/2022 145 (H)  0 - 44 U/L Final   Alkaline Phosphatase 07/03/2022 67  38 - 126 U/L Final   Total Bilirubin 07/03/2022 0.4  0.3 - 1.2 mg/dL Final   GFR, Estimated 07/03/2022 >60  >60 mL/min Final   Comment: (NOTE) Calculated using the CKD-EPI Creatinine Equation (2021)    Anion gap 07/03/2022 7  5 - 15 Final   Performed at [facility], [address] [place], [place] [address]   Total CK 07/04/2022 6,819 (H)  49 - 397 U/L Final   Comment: RESULT CONFIRMED BY MANUAL DILUTION Performed at [facility], [address] [place], [place] [address]    Sodium 07/04/2022 138  135 - 145 mmol/L Final   Potassium 07/04/2022 3.7  3.5 - 5.1 mmol/L Final   Chloride 07/04/2022 102  98 - 111 mmol/L Final   CO2 07/04/2022  27  22 - 32 mmol/L Final   Glucose, Bld 07/04/2022 103 (H)  70 - 99 mg/dL Final   Glucose reference range applies only to samples taken after fasting for at least 8 hours.   BUN 07/04/2022 9  6 - 20 mg/dL Final   Creatinine, Ser 07/04/2022 0.95  0.61 - 1.24 mg/dL Final   Calcium 25/95/6387 9.3  8.9 - 10.3 mg/dL Final   Total Protein 56/43/3295 6.1 (L)  6.5 - 8.1 g/dL Final   Albumin 18/84/1660 3.2 (L)  3.5 - 5.0 g/dL Final   AST 63/01/6008 216 (H)  15 - 41 U/L Final   ALT 07/04/2022 143 (H)  0 - 44 U/L Final   Alkaline Phosphatase 07/04/2022 66  38 - 126 U/L Final   Total Bilirubin 07/04/2022 0.6  0.3 - 1.2 mg/dL Final   GFR, Estimated 07/04/2022 >60  >60 mL/min Final   Comment: (NOTE) Calculated using the CKD-EPI Creatinine Equation (2021)    Anion gap 07/04/2022 9  5 - 15 Final   Performed at [facility], [address], [place], [place] [address]  Admission on 06/02/2022, Discharged on 06/03/2022  Component Date Value Ref Range Status   Sodium 06/02/2022 135  135 - 145 mmol/L Final   Potassium 06/02/2022 5.1  3.5 - 5.1 mmol/L Final   Chloride 06/02/2022 101  98 - 111 mmol/L Final   CO2 06/02/2022 20 (L)  22 - 32 mmol/L Final   Glucose, Bld 06/02/2022 110 (H)  70 - 99 mg/dL Final   Glucose reference range applies only to samples taken after fasting for at least 8 hours.   BUN 06/02/2022 30 (H)  6 - 20 mg/dL  Final   Creatinine, Ser 06/02/2022 2.40 (H)  0.61 - 1.24 mg/dL Final   Calcium 64/40/3474 10.2  8.9 - 10.3 mg/dL Final   GFR, Estimated 06/02/2022 31 (L)  >60 mL/min Final   Comment: (NOTE) Calculated using the CKD-EPI Creatinine Equation (2021)    Anion gap 06/02/2022 14  5 - 15 Final   Performed at [facility], [address], [place], [place] [address]   Troponin I (High Sensitivity) 06/02/2022 80 (H)  <18 ng/L Final   Comment: (NOTE) Elevated high sensitivity troponin I (hsTnI) values and significant  changes across serial measurements may suggest ACS but  many other  chronic and acute conditions are known to elevate hsTnI results.  Refer to the "Links" section for chest pain algorithms and additional  guidance. Performed at [facility], [address], [place], [place] [address]    WBC 06/02/2022 11.4 (H)  4.0 - 10.5 K/uL Final   RBC 06/02/2022 5.07  4.22 - 5.81 MIL/uL Final   Hemoglobin 06/02/2022 15.2  13.0 - 17.0 g/dL Final   HCT 43/32/9518 45.7  39.0 - 52.0 % Final   MCV 06/02/2022 90.1  80.0 - 100.0 fL Final   MCH 06/02/2022 30.0  26.0 - 34.0 pg Final   MCHC 06/02/2022 33.3  30.0 - 36.0 g/dL Final   RDW 84/16/6063 14.4  11.5 - 15.5 % Final   Platelets 06/02/2022 212  150 - 400 K/uL Final   nRBC 06/02/2022 0.0  0.0 - 0.2 % Final   Neutrophils Relative % 06/02/2022 81  % Final   Neutro Abs 06/02/2022 9.2 (H)  1.7 - 7.7 K/uL Final   Lymphocytes Relative 06/02/2022 12  % Final   Lymphs Abs 06/02/2022 1.4  0.7 - 4.0 K/uL Final   Monocytes Relative 06/02/2022 7  % Final   Monocytes Absolute 06/02/2022 0.8  0.1 - 1.0 K/uL Final   Eosinophils Relative 06/02/2022 0  % Final   Eosinophils Absolute 06/02/2022 0.0  0.0 - 0.5 K/uL Final   Basophils Relative 06/02/2022 0  % Final   Basophils Absolute 06/02/2022 0.0  0.0 - 0.1 K/uL Final   Immature Granulocytes 06/02/2022 0  % Final   Abs Immature Granulocytes 06/02/2022 0.02  0.00 - 0.07 K/uL Final   Performed at [facility], [address], [place], [place] [address]   Heparin Unfractionated 06/02/2022 0.24 (L)  0.30 - 0.70 IU/mL Final   Comment: (NOTE) The clinical reportable range upper limit is being lowered to >1.10 to align with the FDA approved guidance for the current laboratory assay.  If heparin results are below expected values, and patient dosage has  been confirmed, suggest follow up testing of antithrombin III levels. Performed at [facility], [address], [place], [place] [address]    Troponin I (High Sensitivity) 06/02/2022 102 (HH)  <18 ng/L  Final   Comment: CRITICAL RESULT CALLED TO, READ BACK BY AND VERIFIED WITH Real Cons. RN @ [phone] 06/02/22 BUTLER J. (NOTE) Elevated high sensitivity troponin I (hsTnI) values and significant  changes across serial measurements may suggest ACS but many other  chronic and acute conditions are known to elevate hsTnI results.  Refer to the "Links" section for chest pain algorithms and additional  guidance. Performed at [facility], [address] [place], [place] [address]    Weight 06/02/2022 3,640  oz Final   Height 06/02/2022 74  in Final   BP 06/02/2022 129/87  mmHg Final   Single Plane A2C EF 06/02/2022 67.3  % Final   Single Plane A4C EF 06/02/2022 60.9  % Final   Calc EF 06/02/2022 63.4  % Final   S' Lateral 06/02/2022 2.80  cm Final   Area-P 1/2 06/02/2022 3.50  cm2 Final   Est EF 06/02/2022 55 - 60%   Final   Heparin Unfractionated 06/03/2022 0.26 (L)  0.30 - 0.70 IU/mL Final   Comment: (NOTE) The clinical reportable range upper limit is being lowered to >1.10 to align with the FDA approved guidance for the current laboratory assay.  If heparin results are below expected values, and patient dosage has  been confirmed, suggest follow up testing of antithrombin III levels. Performed at [facility], [address] [place], [place] [address]    WBC 06/03/2022 8.3  4.0 - 10.5 K/uL Final   RBC 06/03/2022 4.79  4.22 - 5.81 MIL/uL Final   Hemoglobin 06/03/2022 14.6  13.0 - 17.0 g/dL Final   HCT 57/84/6962 43.9  39.0 - 52.0 % Final   MCV 06/03/2022 91.6  80.0 - 100.0 fL Final   MCH 06/03/2022 30.5  26.0 - 34.0 pg Final   MCHC 06/03/2022 33.3  30.0 - 36.0 g/dL Final   RDW 95/28/4132 14.5  11.5 - 15.5 % Final   Platelets 06/03/2022 170  150 - 400 K/uL Final   nRBC 06/03/2022 0.0  0.0 - 0.2 % Final   Performed at [facility], [address] [place], [place] [address]   Sodium 06/03/2022 134 (L)  135 - 145 mmol/L Final   Potassium 06/03/2022 3.8  3.5 - 5.1  mmol/L Final   Chloride 06/03/2022 104  98 - 111 mmol/L Final   CO2 06/03/2022 22  22 - 32 mmol/L Final   Glucose, Bld 06/03/2022 98  70 - 99 mg/dL Final   Glucose reference range applies only to samples taken after fasting for at least 8 hours.   BUN 06/03/2022 30 (H)  6 - 20 mg/dL Final   Creatinine, Ser 06/03/2022 1.41 (H)  0.61 - 1.24 mg/dL Final   Calcium 27/25/3664 8.9  8.9 - 10.3 mg/dL Final   GFR, Estimated 06/03/2022 58 (L)  >60 mL/min Final   Comment: (NOTE) Calculated using the CKD-EPI Creatinine Equation (2021)    Anion gap 06/03/2022 8  5 - 15 Final   Performed at [facility], [address], [place], [place] [address]   Total CK 06/02/2022 3,240 (H)  49 - 397 U/L Final   Performed at [facility], [address] [place], [place] [address]   Heparin Unfractionated 06/03/2022 0.26 (L)  0.30 - 0.70 IU/mL Final   Comment: (NOTE) The clinical reportable range upper limit is being lowered to >1.10 to align with the FDA approved guidance for the current laboratory assay.  If heparin results are below expected values, and patient dosage has  been confirmed, suggest follow up testing of antithrombin III levels. Performed at [facility], [address], [place], [place] [address]   Admission on 03/31/2022, Discharged on 04/04/2022  Component Date Value Ref Range Status   Total Protein 04/02/2022 6.5  6.5 - 8.1 g/dL Final   Albumin 64/33/2951 3.5  3.5 - 5.0 g/dL Final   AST 88/41/6606 45 (H)  15 - 41 U/L Final   ALT 04/02/2022 44  0 - 44 U/L Final   Alkaline Phosphatase 04/02/2022 89  38 - 126 U/L  Final   Total Bilirubin 04/02/2022 0.4  0.3 - 1.2 mg/dL Final   Bilirubin, Direct 04/02/2022 <0.1  0.0 - 0.2 mg/dL Final   Indirect Bilirubin 04/02/2022 NOT CALCULATED  0.3 - 0.9 mg/dL Final   Performed at [facility], [address], [place], [place] [address]   Hepatitis B Surface Ag 04/02/2022 Reactive (A)  NON REACTIVE Final   Comment:  Sample Positive for HBsAg. Result confirmed by neutralization. HEALTH DEPARTMENT NOTIFIED    HCV Ab 04/02/2022 NON REACTIVE  NON REACTIVE Final   Comment: (NOTE) Nonreactive HCV antibody screen is consistent with no HCV infections,  unless recent infection is suspected or other evidence exists to indicate HCV infection.     Hep A IgM 04/02/2022 NON REACTIVE  NON REACTIVE Final   Hep B C IgM 04/02/2022 NON REACTIVE  NON REACTIVE Final   Performed at [facility], [address] [place], [place] [address]   Cholesterol 04/02/2022 138  0 - 200 mg/dL Final   Triglycerides 36/64/4034 258 (H)  <150 mg/dL Final   HDL 74/25/9563 35 (L)  >40 mg/dL Final   Total CHOL/HDL Ratio 04/02/2022 3.9  RATIO Final   VLDL 04/02/2022 52 (H)  0 - 40 mg/dL Final   LDL Cholesterol 04/02/2022 51  0 - 99 mg/dL Final   Comment:        Total Cholesterol/HDL:CHD Risk Coronary Heart Disease Risk Table                     Men   Women  1/2 Average Risk   3.4   3.3  Average Risk       5.0   4.4  2 X Average Risk   9.6   7.1  3 X Average Risk  23.4   11.0        Use the calculated Patient Ratio above and the CHD Risk Table to determine the patient's CHD Risk.        ATP III CLASSIFICATION (LDL):  <100     mg/dL   Optimal  875-643  mg/dL   Near or Above                    Optimal  130-159  mg/dL   Borderline  329-518  mg/dL   High  >841     mg/dL   Very High Performed at [facility], [address] [place], [place] [address]    Hgb A1c MFr Bld 04/03/2022 6.2 (H)  4.8 - 5.6 % Final   Comment: (NOTE)         Prediabetes: 5.7 - 6.4         Diabetes: >6.4         Glycemic control for adults with diabetes: <7.0    Mean Plasma Glucose 04/03/2022 131  mg/dL Final   Comment: (NOTE) Performed At: [facility] [address] [place], [place] [id number] Jolene Schimke MD TF:[id number]   Admission on 03/30/2022, Discharged on 03/31/2022  Component Date Value Ref Range Status    SARS Coronavirus 2 by RT PCR 03/30/2022 NEGATIVE  NEGATIVE Final   Influenza A by PCR 03/30/2022 NEGATIVE  NEGATIVE Final   Influenza B by PCR 03/30/2022 NEGATIVE  NEGATIVE Final   Comment: (NOTE) The Xpert Xpress SARS-CoV-2/FLU/RSV plus assay is intended as an aid in the diagnosis of influenza from Nasopharyngeal swab specimens and should not be used as a sole basis for treatment. Nasal  washings and aspirates are unacceptable for Xpert Xpress SARS-CoV-2/FLU/RSV testing.  Fact Sheet for Patients: BloggerCourse.com  Fact Sheet for Healthcare Providers: SeriousBroker.it  This test is not yet approved or cleared by the [place] FDA and has been authorized for detection and/or diagnosis of SARS-CoV-2 by FDA under an Emergency Use Authorization (EUA). This EUA will remain in effect (meaning this test can be used) for the duration of the COVID-19 declaration under Section 564(b)(1) of the Act, 21 U.S.C. section 360bbb-3(b)(1), unless the authorization is terminated or revoked.     Resp Syncytial Virus by PCR 03/30/2022 NEGATIVE  NEGATIVE Final   Comment: (NOTE) Fact Sheet for Patients: BloggerCourse.com  Fact Sheet for Healthcare Providers: SeriousBroker.it  This test is not yet approved or cleared by the [place] FDA and has been authorized for detection and/or diagnosis of SARS-CoV-2 by FDA under an Emergency Use Authorization (EUA). This EUA will remain in effect (meaning this test can be used) for the duration of the COVID-19 declaration under Section 564(b)(1) of the Act, 21 U.S.C. section 360bbb-3(b)(1), unless the authorization is terminated or revoked.  Performed at [facility], [address] [place], [place] [address]    WBC 03/30/2022 8.7  4.0 - 10.5 K/uL Final   RBC 03/30/2022 5.03  4.22 - 5.81 MIL/uL Final   Hemoglobin 03/30/2022 15.0  13.0 - 17.0  g/dL Final   HCT 30/86/5784 45.2  39.0 - 52.0 % Final   MCV 03/30/2022 89.9  80.0 - 100.0 fL Final   MCH 03/30/2022 29.8  26.0 - 34.0 pg Final   MCHC 03/30/2022 33.2  30.0 - 36.0 g/dL Final   RDW 69/62/9528 13.2  11.5 - 15.5 % Final   Platelets 03/30/2022 188  150 - 400 K/uL Final   nRBC 03/30/2022 0.0  0.0 - 0.2 % Final   Neutrophils Relative % 03/30/2022 60  % Final   Neutro Abs 03/30/2022 5.2  1.7 - 7.7 K/uL Final   Lymphocytes Relative 03/30/2022 29  % Final   Lymphs Abs 03/30/2022 2.5  0.7 - 4.0 K/uL Final   Monocytes Relative 03/30/2022 10  % Final   Monocytes Absolute 03/30/2022 0.9  0.1 - 1.0 K/uL Final   Eosinophils Relative 03/30/2022 1  % Final   Eosinophils Absolute 03/30/2022 0.1  0.0 - 0.5 K/uL Final   Basophils Relative 03/30/2022 0  % Final   Basophils Absolute 03/30/2022 0.0  0.0 - 0.1 K/uL Final   Immature Granulocytes 03/30/2022 0  % Final   Abs Immature Granulocytes 03/30/2022 0.02  0.00 - 0.07 K/uL Final   Performed at [facility], [address] [place], [place] [address]   Sodium 03/30/2022 134 (L)  135 - 145 mmol/L Final   Potassium 03/30/2022 3.7  3.5 - 5.1 mmol/L Final   Chloride 03/30/2022 96 (L)  98 - 111 mmol/L Final   CO2 03/30/2022 25  22 - 32 mmol/L Final   Glucose, Bld 03/30/2022 74  70 - 99 mg/dL Final   Glucose reference range applies only to samples taken after fasting for at least 8 hours.   BUN 03/30/2022 17  6 - 20 mg/dL Final   Creatinine, Ser 03/30/2022 1.09  0.61 - 1.24 mg/dL Final   Calcium 40/10/2723 10.2  8.9 - 10.3 mg/dL Final   Total Protein 36/64/4034 8.1  6.5 - 8.1 g/dL Final   Albumin 74/25/9563 4.8  3.5 - 5.0 g/dL Final   AST 87/56/4332 99 (H)  15 - 41  U/L Final   ALT 03/30/2022 50 (H)  0 - 44 U/L Final   Alkaline Phosphatase 03/30/2022 119  38 - 126 U/L Final   Total Bilirubin 03/30/2022 1.0  0.3 - 1.2 mg/dL Final   GFR, Estimated 03/30/2022 >60  >60 mL/min Final   Comment: (NOTE) Calculated using the CKD-EPI Creatinine  Equation (2021)    Anion gap 03/30/2022 13  5 - 15 Final   Performed at [facility], [address], [place], [place] [address]   Magnesium 03/30/2022 2.5 (H)  1.7 - 2.4 mg/dL Final   Performed at [facility], [address], [place], [place] [address]   Alcohol, Ethyl (B) 03/30/2022 22 (H)  <10 mg/dL Final   Comment: (NOTE) Lowest detectable limit for serum alcohol is 10 mg/dL.  For medical purposes only. Performed at [facility], [address] [place], [place] [address]    Prolactin 03/30/2022 4.8  3.6 - 25.2 ng/mL Final   Comment: (NOTE) Performed At: [facility] [address] [place], [place] [id number] Jolene Schimke MD OZ:[id number]    SARSCOV2ONAVIRUS 2 AG 03/30/2022 NEGATIVE  NEGATIVE Final   Comment: (NOTE) SARS-CoV-2 antigen NOT DETECTED.   Negative results are presumptive.  Negative results do not preclude SARS-CoV-2 infection and should not be used as the sole basis for treatment or other patient management decisions, including infection  control decisions, particularly in the presence of clinical signs and  symptoms consistent with COVID-19, or in those who have been in contact with the virus.  Negative results must be combined with clinical observations, patient history, and epidemiological information. The expected result is Negative.  Fact Sheet for Patients: https://www.jennings-kim.com/  Fact Sheet for Healthcare Providers: https://alexander-rogers.biz/  This test is not yet approved or cleared by the [place] FDA and  has been authorized for detection and/or diagnosis of SARS-CoV-2 by FDA under an Emergency Use Authorization (EUA).  This EUA will remain in effect (meaning this test can be used) for the duration of  the COV                          ID-19 declaration under Section 564(b)(1) of the Act, 21 U.S.C. section 360bbb-3(b)(1), unless the authorization is terminated or revoked  sooner.     TSH 03/30/2022 1.014  0.350 - 4.500 uIU/mL Final   Comment: Performed by a 3rd Generation assay with a functional sensitivity of <=0.01 uIU/mL. Performed at [facility], [address] [place], [place] [address]     Blood Alcohol level:  Lab Results  Component Value Date   ETH <10 08/25/2022   ETH 22 (H) 03/30/2022    Metabolic Disorder Labs: Lab Results  Component Value Date   HGBA1C 6.2 (H) 04/03/2022   MPG 131 04/03/2022   MPG 119.76 09/01/2021   Lab Results  Component Value Date   PROLACTIN 4.8 03/30/2022   PROLACTIN 12.1 03/08/2020   Lab Results  Component Value Date   CHOL 138 04/02/2022   TRIG 258 (H) 04/02/2022   HDL 35 (L) 04/02/2022   CHOLHDL 3.9 04/02/2022   VLDL 52 (H) 04/02/2022   LDLCALC 51 04/02/2022   LDLCALC 180 (H) 08/28/2021    Therapeutic Lab Levels: No results found for: "LITHIUM" No results found for: "VALPROATE" No results found for: "CBMZ"  Physical Findings   AIMS    Flowsheet Row ED to Hosp-Admission (Dis[facility]) [facility]) [facility] ED to Hosp-Admission (Discharged) from [facility] Admission (Discharged) from 04/28/2020 in [facility]PAT[facility] BE[facility]INE Admission (Discharged) from [facility]  AIMS Total Score 0 0 0 0 0      AUDIT    Flowsheet Row ED to Hosp-Admission (Discharged) [facility] ED to Hosp-Admission (Discharged) from [facility] Admission (Discharged) from 04/28/2020 in [facility] INPATIENT BEHAVIORAL MEDICINE Admission (Discharged) from [facility] (Discharged) from [facility]  Alcohol  Use Disorder Identification Test Final Score (AUDIT) 9 3 3 13 8       GAD-7    Flowsheet Row Office Visit from 01/04/2022 in [facility] Office Visit from 03/23/2021 in [facility] Office Visit from 01/17/2021 in [facility] Office Visit from 03/17/2020 in [facility] Office Visit from 07/22/2019 in [facility]  Total GAD-7 Score 4 14 9 16 19       PHQ2-9    Flowsheet Row Office Visit from 01/04/2022 in [facility] Office Visit from 12/22/2021 in [facility] Office Visit from 11/16/2021 in [facility] Office Visit from 11/08/2021 in [facility] ED from 07/02/2021 in [facility]  PHQ-2 Total Score 2 0 0 0 2  PHQ-9 Total Score 6 -- 2 -- 7      Flowsheet Row ED from 08/25/2022 in [facility] ED to Hosp-Admission (Discharged) from 07/01/2022 in [facility]G [facility] ED to Hosp-Admission (Discharged) from 06/02/2022 in [facility]  C-SSRS RISK CATEGORY Moderate Risk No Risk No Risk        Musculoskeletal  Strength & Muscle Tone: within normal limits Gait & Station: normal Patient leans: N/A  Psychiatric Specialty Exam  Presentation  General Appearance:  Appropriate for Environment  Eye Contact: None  Speech: Clear and Coherent; Normal Rate  Speech Volume: Normal  Handedness: Right   Mood and Affect  Mood: Depressed  Affect: Congruent   Thought Process  Thought Processes: Coherent; Linear  Descriptions of Associations:Intact  Orientation:Full (Time, Place and Person)  Thought Content:Logical  Diagnosis of Schizophrenia or Schizoaffective disorder in past: Yes  Duration of Psychotic Symptoms: Less than  six months   Hallucinations:Hallucinations: None  Ideas of Reference:None  Suicidal Thoughts:Suicidal Thoughts: Yes, Active SI Active Intent and/or Plan: With Plan; Without Intent  Homicidal Thoughts:Homicidal Thoughts: No   Sensorium  Memory: Recent Fair; Immediate Fair  Judgment: Intact  Insight: Shallow   Executive Functions  Concentration: Good  Attention Span: Good  Recall: Good  Fund of Knowledge: Good  Language: Good   Psychomotor Activity  Psychomotor Activity: Psychomotor Activity: Normal   Assets  Assets: Communication Skills; Financial Resources/Insurance; Housing; Social Support   Sleep  Sleep: Sleep: Fair   Nutritional Assessment (For OBS and FBC admissions only) Has the patient had a weight loss or gain of 10 pounds or more in the last 3 months?: No Has the patient had a decrease in food intake/or appetite?: No Does the patient have dental problems?: No Does  the patient have eating habits or behaviors that may be indicators of an eating disorder including binging or inducing vomiting?: No Has the patient recently lost weight without trying?: 0 Has the patient been eating poorly because of a decreased appetite?: 0 Malnutrition Screening Tool Score: 0    Physical Exam  Physical Exam Vitals and nursing note reviewed. Exam conducted with a chaperone present.  Constitutional:      General: He is not in acute distress.    Appearance: Normal appearance. He is not ill-appearing.  HENT:     Head: Normocephalic.  Eyes:     Conjunctiva/sclera: Conjunctivae normal.  Cardiovascular:     Rate and Rhythm: Normal rate.  Pulmonary:     Effort: Pulmonary effort is normal.  Musculoskeletal:        General: Normal range of motion.     Cervical back: Normal range of motion.  Skin:    General: Skin is warm and dry.  Neurological:     Mental Status: He is alert and oriented to person, place, and time.  Psychiatric:        Attention and  Perception: Attention and perception normal.        Mood and Affect: Mood is depressed.        Speech: Speech normal.        Behavior: Behavior normal. Behavior is cooperative.        Thought Content: Thought content is not paranoid or delusional. Thought content includes suicidal ideation. Thought content does not include homicidal ideation. Thought content includes suicidal plan.        Judgment: Judgment is impulsive.    Review of Systems  Constitutional:        No other complaints voiced   Psychiatric/Behavioral:  Positive for depression, substance abuse and suicidal ideas. Hallucinations: Denies at this time.The patient is nervous/anxious.   All other systems reviewed and are negative.  Blood pressure 100/65, pulse 60, temperature 98.6 F (37 C), temperature source Oral, resp. rate 18, SpO2 97%. There is no height or weight on file to calculate BMI.  Treatment Plan Summary: Daily contact with patient to assess and evaluate symptoms and progress in treatment, Medication management, and Plan Inpatient psychiatric treatment  Assunta Found, NP 08/26/2022 12:25 PM

## 2022-08-26 NOTE — ED Notes (Signed)
Patient in milieu. Environment is secured. Will continue to monitor for safety. 

## 2022-08-26 NOTE — Progress Notes (Signed)
[facility] LCSW Progress Note   08/26/2022    4:54 PM  [name]   [id number]   Type of Contact and Topic:  Psychiatric Bed Placement   Pt accepted to [facility] 406-01  Patient meets inpatient criteria per Assunta Found, NP    The attending provider will be Dr. Enedina Finner   Call report to [phone]    Durwin Reges, RN @ [facility] notified.     Pt scheduled  to arrive at [facility] TODAY at 12 Midnight.   Damita Dunnings, MSW, LCSW-A  4:57 PM 08/26/2022

## 2022-08-26 NOTE — ED Notes (Signed)
Patient resting quietly in bed with eyes closed, Respirations equal and unlabored, skin warm and dry, NAD. Routine safety checks conducted according to facility protocol. Will continue to monitor for safety. 

## 2022-08-26 NOTE — ED Notes (Signed)
Patient  sleeping in no acute stress. RR even and unlabored .Environment secured .Will continue to monitor for safely. 

## 2022-08-26 NOTE — ED Notes (Signed)
Pt sleeping@this time. Breathing even and unlabored. Will continue to monitor for safety 

## 2022-08-27 ENCOUNTER — Other Ambulatory Visit: Payer: Self-pay

## 2022-08-27 ENCOUNTER — Inpatient Hospital Stay (HOSPITAL_COMMUNITY)
Admission: AD | Admit: 2022-08-27 | Discharge: 2022-08-29 | DRG: 897 | Disposition: A | Discharge status: Home / Self Care | Payer: MEDICAID | Source: Intra-hospital | Attending: Psychiatry | Admitting: Psychiatry

## 2022-08-27 ENCOUNTER — Encounter (HOSPITAL_COMMUNITY): Payer: Self-pay | Admitting: Registered Nurse

## 2022-08-27 DIAGNOSIS — Z62810 Personal history of physical and sexual abuse in childhood: Secondary | ICD-10-CM

## 2022-08-27 DIAGNOSIS — F17210 Nicotine dependence, cigarettes, uncomplicated: Secondary | ICD-10-CM | POA: Diagnosis present

## 2022-08-27 DIAGNOSIS — F319 Bipolar disorder, unspecified: Secondary | ICD-10-CM | POA: Insufficient documentation

## 2022-08-27 DIAGNOSIS — F17200 Nicotine dependence, unspecified, uncomplicated: Secondary | ICD-10-CM | POA: Diagnosis present

## 2022-08-27 DIAGNOSIS — Z8619 Personal history of other infectious and parasitic diseases: Secondary | ICD-10-CM | POA: Diagnosis not present

## 2022-08-27 DIAGNOSIS — F1210 Cannabis abuse, uncomplicated: Secondary | ICD-10-CM | POA: Diagnosis present

## 2022-08-27 DIAGNOSIS — F1994 Other psychoactive substance use, unspecified with psychoactive substance-induced mood disorder: Principal | ICD-10-CM | POA: Diagnosis present

## 2022-08-27 DIAGNOSIS — Z1152 Encounter for screening for COVID-19: Secondary | ICD-10-CM

## 2022-08-27 DIAGNOSIS — F419 Anxiety disorder, unspecified: Secondary | ICD-10-CM | POA: Diagnosis present

## 2022-08-27 DIAGNOSIS — Z8249 Family history of ischemic heart disease and other diseases of the circulatory system: Secondary | ICD-10-CM

## 2022-08-27 DIAGNOSIS — Z96652 Presence of left artificial knee joint: Secondary | ICD-10-CM | POA: Diagnosis present

## 2022-08-27 DIAGNOSIS — R7303 Prediabetes: Secondary | ICD-10-CM | POA: Diagnosis present

## 2022-08-27 DIAGNOSIS — K219 Gastro-esophageal reflux disease without esophagitis: Secondary | ICD-10-CM | POA: Diagnosis present

## 2022-08-27 DIAGNOSIS — M1990 Unspecified osteoarthritis, unspecified site: Secondary | ICD-10-CM | POA: Diagnosis present

## 2022-08-27 DIAGNOSIS — R45851 Suicidal ideations: Secondary | ICD-10-CM | POA: Diagnosis present

## 2022-08-27 DIAGNOSIS — F5105 Insomnia due to other mental disorder: Secondary | ICD-10-CM | POA: Diagnosis present

## 2022-08-27 DIAGNOSIS — Z21 Asymptomatic human immunodeficiency virus [HIV] infection status: Secondary | ICD-10-CM | POA: Diagnosis present

## 2022-08-27 DIAGNOSIS — E785 Hyperlipidemia, unspecified: Secondary | ICD-10-CM | POA: Diagnosis present

## 2022-08-27 DIAGNOSIS — F17290 Nicotine dependence, other tobacco product, uncomplicated: Secondary | ICD-10-CM | POA: Diagnosis present

## 2022-08-27 DIAGNOSIS — F4310 Post-traumatic stress disorder, unspecified: Secondary | ICD-10-CM | POA: Diagnosis present

## 2022-08-27 DIAGNOSIS — R440 Auditory hallucinations: Secondary | ICD-10-CM | POA: Diagnosis present

## 2022-08-27 DIAGNOSIS — F1510 Other stimulant abuse, uncomplicated: Secondary | ICD-10-CM | POA: Diagnosis present

## 2022-08-27 DIAGNOSIS — F1010 Alcohol abuse, uncomplicated: Secondary | ICD-10-CM | POA: Diagnosis present

## 2022-08-27 DIAGNOSIS — Z880 Allergy status to penicillin: Secondary | ICD-10-CM

## 2022-08-27 DIAGNOSIS — I10 Essential (primary) hypertension: Secondary | ICD-10-CM | POA: Diagnosis present

## 2022-08-27 DIAGNOSIS — Z833 Family history of diabetes mellitus: Secondary | ICD-10-CM

## 2022-08-27 DIAGNOSIS — Z7982 Long term (current) use of aspirin: Secondary | ICD-10-CM

## 2022-08-27 DIAGNOSIS — Z634 Disappearance and death of family member: Secondary | ICD-10-CM

## 2022-08-27 DIAGNOSIS — F1290 Cannabis use, unspecified, uncomplicated: Secondary | ICD-10-CM | POA: Insufficient documentation

## 2022-08-27 DIAGNOSIS — Z91040 Latex allergy status: Secondary | ICD-10-CM

## 2022-08-27 DIAGNOSIS — F1090 Alcohol use, unspecified, uncomplicated: Secondary | ICD-10-CM | POA: Diagnosis present

## 2022-08-27 DIAGNOSIS — Z79899 Other long term (current) drug therapy: Secondary | ICD-10-CM

## 2022-08-27 DIAGNOSIS — F1590 Other stimulant use, unspecified, uncomplicated: Secondary | ICD-10-CM | POA: Diagnosis present

## 2022-08-27 DIAGNOSIS — Z9151 Personal history of suicidal behavior: Secondary | ICD-10-CM

## 2022-08-27 DIAGNOSIS — Z635 Disruption of family by separation and divorce: Secondary | ICD-10-CM | POA: Diagnosis not present

## 2022-08-27 LAB — PROLACTIN: Prolactin: 2.6 ng/mL — ABNORMAL LOW (ref 3.6–25.2)

## 2022-08-27 MED ORDER — DIPHENHYDRAMINE HCL 25 MG PO CAPS: 50.0000 mg | ORAL_CAPSULE | Freq: Three times a day (TID) | ORAL | Status: DC | PRN

## 2022-08-27 MED ORDER — ACETAMINOPHEN 325 MG PO TABS: 650.0000 mg | ORAL_TABLET | Freq: Four times a day (QID) | ORAL | Status: DC | PRN

## 2022-08-27 MED ORDER — DIPHENHYDRAMINE HCL 50 MG/ML IJ SOLN: 25.0000 mg | Freq: Four times a day (QID) | INTRAMUSCULAR | Status: DC | PRN

## 2022-08-27 MED ORDER — HALOPERIDOL 5 MG PO TABS: 5.0000 mg | ORAL_TABLET | Freq: Four times a day (QID) | ORAL | Status: DC | PRN

## 2022-08-27 MED ORDER — NICOTINE POLACRILEX 2 MG MT GUM: 2.0000 mg | CHEWING_GUM | OROMUCOSAL | Status: DC | PRN

## 2022-08-27 MED ORDER — ALUM & MAG HYDROXIDE-SIMETH 200-200-20 MG/5ML PO SUSP: 30.0000 mL | ORAL | Status: DC | PRN

## 2022-08-27 MED ORDER — LORAZEPAM 2 MG/ML IJ SOLN: 1.0000 mg | Freq: Four times a day (QID) | INTRAMUSCULAR | Status: DC | PRN

## 2022-08-27 MED ORDER — HALOPERIDOL LACTATE 5 MG/ML IJ SOLN: 5.0000 mg | Freq: Three times a day (TID) | INTRAMUSCULAR | Status: DC | PRN

## 2022-08-27 MED ORDER — HYDROXYZINE HCL 25 MG PO TABS: 25.0000 mg | ORAL_TABLET | Freq: Three times a day (TID) | ORAL | Status: DC | PRN

## 2022-08-27 MED ORDER — LORAZEPAM 1 MG PO TABS: 1.0000 mg | ORAL_TABLET | Freq: Four times a day (QID) | ORAL | Status: DC | PRN

## 2022-08-27 MED ORDER — DIPHENHYDRAMINE HCL 25 MG PO CAPS: 25.0000 mg | ORAL_CAPSULE | Freq: Four times a day (QID) | ORAL | Status: DC | PRN

## 2022-08-27 MED ORDER — TRAZODONE HCL 50 MG PO TABS
50.0000 mg | ORAL_TABLET | Freq: Every evening | ORAL | Status: DC | PRN
  Administered 2022-08-27: 50 mg via ORAL
  Filled 2022-08-27 (×2): qty 1

## 2022-08-27 MED ORDER — POLYETHYLENE GLYCOL 3350 17 G PO PACK: 17.0000 g | PACK | Freq: Every day | ORAL | Status: DC | PRN

## 2022-08-27 MED ORDER — LORAZEPAM 1 MG PO TABS: 2.0000 mg | ORAL_TABLET | Freq: Three times a day (TID) | ORAL | Status: DC | PRN

## 2022-08-27 MED ORDER — BICTEGRAVIR-EMTRICITAB-TENOFOV 50-200-25 MG PO TABS
1.0000 | ORAL_TABLET | Freq: Every day | ORAL | Status: DC
  Administered 2022-08-27 – 2022-08-29 (×3): 1 via ORAL
  Filled 2022-08-27 (×6): qty 1

## 2022-08-27 MED ORDER — LORAZEPAM 2 MG/ML IJ SOLN: 2.0000 mg | Freq: Three times a day (TID) | INTRAMUSCULAR | Status: DC | PRN

## 2022-08-27 MED ORDER — TRAZODONE HCL 50 MG PO TABS: 50.0000 mg | ORAL_TABLET | Freq: Every evening | ORAL | Status: DC | PRN

## 2022-08-27 MED ORDER — HALOPERIDOL 5 MG PO TABS: 5.0000 mg | ORAL_TABLET | Freq: Three times a day (TID) | ORAL | Status: DC | PRN

## 2022-08-27 MED ORDER — DIPHENHYDRAMINE HCL 50 MG/ML IJ SOLN: 50.0000 mg | Freq: Three times a day (TID) | INTRAMUSCULAR | Status: DC | PRN

## 2022-08-27 MED ORDER — ACETAMINOPHEN 325 MG PO TABS
650.0000 mg | ORAL_TABLET | Freq: Four times a day (QID) | ORAL | Status: DC | PRN
  Administered 2022-08-28: 650 mg via ORAL
  Filled 2022-08-27: qty 2

## 2022-08-27 MED ORDER — HALOPERIDOL LACTATE 5 MG/ML IJ SOLN: 5.0000 mg | Freq: Four times a day (QID) | INTRAMUSCULAR | Status: DC | PRN

## 2022-08-27 MED ORDER — SENNA 8.6 MG PO TABS: 1.0000 | ORAL_TABLET | Freq: Every evening | ORAL | Status: DC | PRN

## 2022-08-27 MED ORDER — HYDROXYZINE HCL 25 MG PO TABS
25.0000 mg | ORAL_TABLET | Freq: Three times a day (TID) | ORAL | Status: DC | PRN
  Administered 2022-08-27 – 2022-08-28 (×2): 25 mg via ORAL
  Filled 2022-08-27 (×2): qty 1

## 2022-08-27 MED ORDER — MAGNESIUM HYDROXIDE 400 MG/5ML PO SUSP: 30.0000 mL | Freq: Every day | ORAL | Status: DC | PRN

## 2022-08-27 MED ORDER — NICOTINE 21 MG/24HR TD PT24
21.0000 mg | MEDICATED_PATCH | Freq: Every day | TRANSDERMAL | Status: DC
  Administered 2022-08-27 – 2022-08-29 (×3): 21 mg via TRANSDERMAL
  Filled 2022-08-27 (×5): qty 1

## 2022-08-27 MED ORDER — BISMUTH SUBSALICYLATE 262 MG PO CHEW: 524.0000 mg | CHEWABLE_TABLET | ORAL | Status: DC | PRN

## 2022-08-27 MED ORDER — ASPIRIN 81 MG PO TBEC
81.0000 mg | DELAYED_RELEASE_TABLET | Freq: Every day | ORAL | Status: DC
  Administered 2022-08-27 – 2022-08-29 (×3): 81 mg via ORAL
  Filled 2022-08-27 (×6): qty 1

## 2022-08-27 MED ORDER — ONDANSETRON HCL 4 MG PO TABS
8.0000 mg | ORAL_TABLET | Freq: Three times a day (TID) | ORAL | Status: DC | PRN
  Administered 2022-08-29: 8 mg via ORAL
  Filled 2022-08-27: qty 2

## 2022-08-27 NOTE — [facility] Group Notes (Signed)
Pt did not attend goals group. 

## 2022-08-27 NOTE — Progress Notes (Signed)
This Child psychotherapist met with patient to discuss Wellness Check options for his pet. Patient concerned about the well-being of his dog as he is home alone. The following options offered including calling his girlfriend, sister and the police department. Patient declined all of the above options stating his desire to "leave it alone right now". This Child psychotherapist encouraged patient to reach back out to the social work team in the event that his circumstances change.   [employer], LCSW [facility]
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
## 2022-08-27 NOTE — Progress Notes (Signed)
D: Patient is alert, oriented, and cooperative. Denies SI, HI, AVH, and verbally contracts for safety. He did refuse to wear a mask in the dayroom.   A: Scheduled medications administered per MD order. Support provided. Patient educated on safety on the unit and medications. Routine safety checks every 15 minutes. Patient stated understanding to tell nurse about any new physical symptoms. Patient understands to tell staff of any needs.     R: No adverse drug reactions noted. Patient verbally contracts for safety. Patient remains safe at this time and will continue to monitor.    08/27/22 0900  Psych Admission Type (Psych Patients Only)  Admission Status Voluntary  Psychosocial Assessment  Patient Complaints Anxiety;Depression;Sleep disturbance  Eye Contact Fair  Facial Expression Animated  Affect Anxious  Speech Logical/coherent  Interaction Assertive  Motor Activity Other (Comment) (WNL)  Appearance/Hygiene Unremarkable  Behavior Characteristics Cooperative  Mood Depressed;Anxious  Thought Process  Coherency WDL  Content WDL  Delusions None reported or observed  Perception WDL  Hallucination None reported or observed  Judgment Impaired  Confusion None  Danger to Self  Current suicidal ideation? Denies  Agreement Not to Harm Self Yes  Description of Agreement verbal  Danger to Others  Danger to Others None reported or observed

## 2022-08-27 NOTE — Progress Notes (Signed)
   08/27/22 2050  Psych Admission Type (Psych Patients Only)  Admission Status Voluntary  Psychosocial Assessment  Patient Complaints Anxiety;Depression;Sleep disturbance  Eye Contact Fair  Facial Expression Animated  Affect Anxious  Speech Logical/coherent  Interaction Assertive  Motor Activity Other (Comment) (WNL)  Appearance/Hygiene Unremarkable  Behavior Characteristics Cooperative  Mood Depressed;Anxious  Thought Process  Coherency WDL  Content WDL  Delusions None reported or observed  Perception WDL  Hallucination None reported or observed  Judgment Impaired  Confusion None  Danger to Self  Current suicidal ideation? Denies  Agreement Not to Harm Self Yes  Description of Agreement verbal  Danger to Others  Danger to Others None reported or observed
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
## 2022-08-27 NOTE — Plan of Care (Signed)
  Problem: Education: Goal: Knowledge of [facility] information/materials will improve Outcome: Progressing   

## 2022-08-27 NOTE — Progress Notes (Signed)
   08/27/22 0227  Psych Admission Type (Psych Patients Only)  Admission Status Voluntary  Psychosocial Assessment  Patient Complaints Anxiety;Depression;Sleep disturbance;Worrying  Eye Contact Fair  Facial Expression Animated;Anxious;Wide-eyed  Affect Anxious  Speech Logical/coherent  Interaction Assertive  Motor Activity Other (Comment) (WNL)  Appearance/Hygiene In hospital gown  Behavior Characteristics Cooperative;Anxious  Mood Depressed;Anxious  Aggressive Behavior  Effect No apparent injury  Thought Process  Coherency WDL  Content Blaming self  Delusions None reported or observed  Perception WDL  Hallucination None reported or observed  Judgment Impaired  Confusion None  Danger to Self  Current suicidal ideation? Denies  Agreement Not to Harm Self Yes  Description of Agreement verbal  Danger to Others  Danger to Others None reported or observed

## 2022-08-27 NOTE — Hospital Course (Signed)
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 Has prominent PTSD symptoms Disposition: home with OP follow-up. Will need to schedule f/u appt with Dr. Josephina Shih
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
## 2022-08-27 NOTE — [facility] Group Notes (Signed)
[facility] Group Notes:  (Nursing/MHT/Case Management/Adjunct)  Date:  08/27/2022  Time:  10:18 PM  Type of Therapy:   Group Wrap Up  Participation Level:  Active  Participation Quality:  Appropriate, Sharing, and Supportive  Affect:  Appropriate  Cognitive:  Alert and Appropriate  Insight:  Appropriate, Good, and Improving  Engagement in Group:  Engaged and Supportive  Modes of Intervention:  Socialization and Support  Summary of Progress/Problems: Pt attended group  Granville Lewis 08/27/2022, 10:18 PM

## 2022-08-28 ENCOUNTER — Encounter (HOSPITAL_COMMUNITY): Payer: Self-pay

## 2022-08-28 MED ORDER — LORAZEPAM 2 MG/ML IJ SOLN: 1.0000 mg | Freq: Four times a day (QID) | INTRAMUSCULAR | Status: DC | PRN

## 2022-08-28 MED ORDER — QUETIAPINE FUMARATE 50 MG PO TABS
150.0000 mg | ORAL_TABLET | Freq: Every day | ORAL | Status: DC
  Administered 2022-08-28: 150 mg via ORAL
  Filled 2022-08-28 (×3): qty 3

## 2022-08-28 MED ORDER — DIPHENHYDRAMINE HCL 50 MG/ML IJ SOLN: 25.0000 mg | Freq: Four times a day (QID) | INTRAMUSCULAR | Status: DC | PRN

## 2022-08-28 MED ORDER — HALOPERIDOL LACTATE 5 MG/ML IJ SOLN: 5.0000 mg | Freq: Four times a day (QID) | INTRAMUSCULAR | Status: DC | PRN

## 2022-08-28 MED ORDER — GABAPENTIN 300 MG PO CAPS
300.0000 mg | ORAL_CAPSULE | Freq: Three times a day (TID) | ORAL | Status: DC
  Administered 2022-08-28 – 2022-08-29 (×3): 300 mg via ORAL
  Filled 2022-08-28 (×8): qty 1

## 2022-08-28 MED ORDER — QUETIAPINE FUMARATE 100 MG PO TABS
100.0000 mg | ORAL_TABLET | Freq: Once | ORAL | Status: DC
  Filled 2022-08-28: qty 1

## 2022-08-28 NOTE — Progress Notes (Signed)
Patient refused to answer assessment questions and have vitals taken this morning. Patient is mostly irritable thoughout shift and at some points loud and yelling. A one time dose of Seroquel was ordered when patient was yelling but patient refused it. Scheduled medications administered per MD order. Patient complained of right shoulder pain rated 5/10 that he received tylenol for at 1706. An hour later patient reports tylenol was effective and rates his pain 0/10. Safety maintained.   08/28/22 1000  Psych Admission Type (Psych Patients Only)  Admission Status Voluntary/72 hour document signed  Date 72 hour document signed  08/28/22  Time 72 hour document signed  1006  Provider Notified (First and Last Name) (see details for LINK to note) Luan Moore, MD  Psychosocial Assessment  Patient Complaints Anxiety  Eye Contact Brief  Facial Expression Animated  Affect Anxious;Irritable  Speech Logical/coherent  Interaction Assertive;Demanding  Motor Activity Other (Comment) (WNL)  Appearance/Hygiene Unremarkable  Behavior Characteristics Irritable  Mood Anxious;Irritable  Thought Process  Coherency WDL  Content WDL  Delusions None reported or observed  Perception WDL  Hallucination None reported or observed  Judgment Limited  Confusion None  Danger to Self  Current suicidal ideation?  (pt refused to answer)  Danger to Others  Danger to Others None reported or observed
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
## 2022-08-28 NOTE — Progress Notes (Signed)
Pt stated he remembered what writer was talking about the last time he was here, and it helped him buy a house and helped him with other aspects of his life.     08/28/22 2130  Psych Admission Type (Psych Patients Only)  Admission Status Voluntary/72 hour document signed  Date 72 hour document signed  08/28/22  Time 72 hour document signed  1006  Psychosocial Assessment  Patient Complaints Anxiety  Eye Contact Fair  Facial Expression Anxious  Affect Anxious  Speech Logical/coherent  Interaction Assertive  Motor Activity Slow  Appearance/Hygiene Unremarkable  Behavior Characteristics Cooperative  Mood Pleasant  Aggressive Behavior  Effect No apparent injury  Thought Process  Coherency WDL  Content WDL  Delusions WDL  Perception WDL  Hallucination None reported or observed  Judgment WDL  Confusion WDL  Danger to Self  Current suicidal ideation? Denies

## 2022-08-28 NOTE — Plan of Care (Signed)
  Problem: Education: Goal: Emotional status will improve Outcome: Progressing Goal: Mental status will improve Outcome: Progressing   Problem: Activity: Goal: Sleeping patterns will improve Outcome: Progressing   Problem: Safety: Goal: Periods of time without injury will increase Outcome: Progressing   

## 2022-08-28 NOTE — [facility] Group Notes (Signed)
Adult Psychoeducational Group Note  Date:  08/28/2022 Time:  9:22 PM  Group Topic/Focus:  Wrap-Up Group:   The focus of this group is to help patients review their daily goal of treatment and discuss progress on daily workbooks.  Participation Level:  Active  Participation Quality:  Appropriate  Affect:  Appropriate  Cognitive:  Appropriate  Insight: Appropriate  Engagement in Group:  Engaged  Modes of Intervention:  Discussion  Additional Comments:  Pt told that today was an "okay" day on the unit, the highlight of which was talking to his girlfriend about caring for his dog, which had been a concern. On the subject of goals for the week, Pt mentioned wanting to discharge so that he can get back to work. "I'm hopeful to leave tomorrow. We'll see." Pt rated his day a 4 out of 10.   Christ Kick 08/28/2022, 9:22 PM

## 2022-08-28 NOTE — Group Note (Signed)
Recreation Therapy Group Note   Group Topic:Stress Management  Group Date: 08/28/2022 Start Time: 0930 End Time: 1000 Facilitators: Sayge Brienza-McCall, LRT,CTRS Location: [address] Dayroom   Goal Area(s) Addresses:  Patient will actively participate in stress management techniques presented during session.  Patient will successfully identify benefit of practicing stress management post d/c.   Group Description:  Guided Imagery. LRT provided education, instruction, and demonstration on practice of visualization via guided imagery. Patient was asked to participate in the technique introduced during session. LRT debriefed including topics of mindfulness, stress management and specific scenarios each patient could use these techniques. Patients were given suggestions of ways to access scripts post d/c and encouraged to explore Youtube and other apps available on smartphones, tablets, and computers.   Clinical Observations/Individualized Feedback: Unable to conduct group session due to maintenance being completed in dayroom.     Plan: Continue to engage patient in RT group sessions 2-3x/week.   Taren Dymek-McCall, LRT,CTRS 08/28/2022 12:04 PM

## 2022-08-28 NOTE — Plan of Care (Signed)

## 2022-08-28 NOTE — Progress Notes (Signed)
Patient signed a 72 hour request for discharge 08/28/22 at 1006.

## 2022-08-28 NOTE — [facility] Suicide Risk Assessment (Signed)
[facility] INPATIENT:  Family/Significant Other Suicide Prevention Education  Suicide Prevention Education:  Education Completed; Dyan Litten (sister) [phone],  (name of family member/significant other) has been identified by the patient as the family member/significant other with whom the patient will be residing, and identified as the person(s) who will aid the patient in the event of a mental health crisis (suicidal ideations/suicide attempt).  With written consent from the patient, the family member/significant other has been provided the following suicide prevention education, prior to the and/or following the discharge of the patient.  Sister stated that she did not know that patient was here or about any past Mental Health issues. Sister stated that patient use to live in [place] but just moved to [place] , was unsure of his address. Sister stated that she has never known for patient to own or have access to any guns or weapons.   The suicide prevention education provided includes the following: Suicide risk factors Suicide prevention and interventions National Suicide Hotline telephone number [facility] assessment telephone number [facility] Emergency Assistance 911 [facility] and/or Residential Mobile Crisis Unit telephone number  Request made of family/significant other to: Remove weapons (e.g., guns, rifles, knives), all items previously/currently identified as safety concern.   Remove drugs/medications (over-the-counter, prescriptions, illicit drugs), all items previously/currently identified as a safety concern.  The family member/significant other verbalizes understanding of the suicide prevention education information provided.  The family member/significant other agrees to remove the items of safety concern listed above.  Isabella Bowens 08/28/2022, 2:29 PM

## 2022-08-28 NOTE — Group Note (Signed)
Date:  08/28/2022 Time:  6:26 PM  Group Topic/Focus:  Goals Group:   The focus of this group is to help patients establish daily goals to achieve during treatment and discuss how the patient can incorporate goal setting into their daily lives to aide in recovery.    Participation Level:  Minimal  Participation Quality:  Intrusive  Affect:  Defensive  Cognitive:  Appropriate  Insight: Lacking  Engagement in Group:  Lacking  Modes of Intervention:  Discussion  Additional Comments:  Pt goal: To get home to my dog  Donell Beers 08/28/2022, 6:26 PM

## 2022-08-28 NOTE — BH IP Treatment Plan (Signed)
Interdisciplinary Treatment and Diagnostic Plan   08/28/2022 Time of Session: 1100 Dylan [name] MRN: [id number]  Principal Diagnosis: Substance induced mood disorder (HCC)  Secondary Diagnoses: Principal Problem:   Substance induced mood disorder (HCC) Active Problems:   Tobacco use disorder   Stimulant use disorder   Alcohol use disorder   PTSD (post-traumatic stress disorder)   Cannabis use disorder   Current Medications:  Current Facility-Administered Medications  Medication Dose Route Frequency Provider Last Rate Last Admin   acetaminophen (TYLENOL) tablet 650 mg  650 mg Oral Q6H PRN Augusto Gamble, MD       alum & mag hydroxide-simeth (MAALOX/MYLANTA) 200-200-20 MG/5ML suspension 30 mL  30 mL Oral Q4H PRN Augusto Gamble, MD       aspirin EC tablet 81 mg  81 mg Oral Daily Augusto Gamble, MD   81 mg at 08/28/22 0755   bictegravir-emtricitabine-tenofovir AF (BIKTARVY) 50-200-25 MG per tablet 1 tablet  1 tablet Oral Daily Augusto Gamble, MD   1 tablet at 08/28/22 0755   bismuth subsalicylate (PEPTO BISMOL) chewable tablet 524 mg  524 mg Oral Q3H PRN Augusto Gamble, MD       haloperidol (HALDOL) tablet 5 mg  5 mg Oral Q6H PRN Augusto Gamble, MD       And   LORazepam (ATIVAN) tablet 1 mg  1 mg Oral Q6H PRN Augusto Gamble, MD       And   diphenhydrAMINE (BENADRYL) capsule 25 mg  25 mg Oral Q6H PRN Augusto Gamble, MD       haloperidol lactate (HALDOL) injection 5 mg  5 mg Intramuscular Q6H PRN Carrion-Carrero, Margely, MD       And   LORazepam (ATIVAN) injection 1 mg  1 mg Intramuscular Q6H PRN Carrion-Carrero, Margely, MD       And   diphenhydrAMINE (BENADRYL) injection 25 mg  25 mg Intramuscular Q6H PRN Carrion-Carrero, Margely, MD       gabapentin (NEURONTIN) capsule 300 mg  300 mg Oral TID Carrion-Carrero, Margely, MD       hydrOXYzine (ATARAX) tablet 25 mg  25 mg Oral TID PRN Augusto Gamble, MD   25 mg at 08/27/22 2105   nicotine (NICODERM CQ - dosed in mg/24 hours) patch 21 mg  21 mg Transdermal Daily  Augusto Gamble, MD   21 mg at 08/28/22 0755   nicotine polacrilex (NICORETTE) gum 2 mg  2 mg Oral PRN Augusto Gamble, MD       ondansetron [facility]) tablet 8 mg  8 mg Oral Q8H PRN Augusto Gamble, MD       polyethylene glycol (MIRALAX / GLYCOLAX) packet 17 g  17 g Oral Daily PRN Augusto Gamble, MD       QUEtiapine (SEROQUEL) tablet 100 mg  100 mg Oral Once Carrion-Carrero, Karle Starch, MD       QUEtiapine (SEROQUEL) tablet 150 mg  150 mg Oral QHS Carrion-Carrero, Margely, MD       senna (SENOKOT) tablet 8.6 mg  1 tablet Oral QHS PRN Augusto Gamble, MD       traZODone (DESYREL) tablet 50 mg  50 mg Oral QHS PRN Augusto Gamble, MD   50 mg at 08/27/22 2105   PTA Medications: Medications Prior to Admission  Medication Sig Dispense Refill Last Dose   aspirin EC 81 MG tablet Take 1 tablet (81 mg total) by mouth daily. Swallow whole. 30 tablet 12    bictegravir-emtricitabine-tenofovir AF (BIKTARVY) 50-200-25 MG TABS tablet Take 1 tablet by mouth daily. 30 tablet  11    gabapentin (NEURONTIN) 300 MG capsule Take 2 capsules (600 mg total) by mouth 3 (three) times daily. 180 capsule 1    nicotine polacrilex (NICORETTE) 4 MG gum Take 4 mg by mouth as needed for smoking cessation. (Patient not taking: Reported on 08/26/2022)      omeprazole (PRILOSEC) 40 MG capsule Take 1 capsule (40 mg total) by mouth daily. 30 capsule 3    QUEtiapine (SEROQUEL) 300 MG tablet Take 1 tablet (300 mg total) by mouth at bedtime. 30 tablet 2    sildenafil (VIAGRA) 100 MG tablet Take 1 tablet (100 mg total) by mouth daily as needed for erectile dysfunction. At least 24 hours between doses. (Patient not taking: Reported on 08/26/2022) 10 tablet 2    traZODone (DESYREL) 50 MG tablet Take 1 tablet (50 mg total) by mouth at bedtime as needed for sleep. 30 tablet 2     Patient Stressors:    Patient Strengths:    Treatment Modalities: Medication Management, Group therapy, Case management,  1 to 1 session with clinician, Psychoeducation, Recreational  therapy.   Physician Treatment Plan for Primary Diagnosis: Substance induced mood disorder (HCC) Long Term Goal(s): Improvement in symptoms so as ready for discharge   Short Term Goals: Ability to identify changes in lifestyle to reduce recurrence of condition will improve Ability to verbalize feelings will improve  Medication Management: Evaluate patient's response, side effects, and tolerance of medication regimen.  Therapeutic Interventions: 1 to 1 sessions, Unit Group sessions and Medication administration.  Evaluation of Outcomes: Progressing  Physician Treatment Plan for Secondary Diagnosis: Principal Problem:   Substance induced mood disorder (HCC) Active Problems:   Tobacco use disorder   Stimulant use disorder   Alcohol use disorder   PTSD (post-traumatic stress disorder)   Cannabis use disorder  Long Term Goal(s): Improvement in symptoms so as ready for discharge   Short Term Goals: Ability to identify changes in lifestyle to reduce recurrence of condition will improve Ability to verbalize feelings will improve     Medication Management: Evaluate patient's response, side effects, and tolerance of medication regimen.  Therapeutic Interventions: 1 to 1 sessions, Unit Group sessions and Medication administration.  Evaluation of Outcomes: Progressing   RN Treatment Plan for Primary Diagnosis: Substance induced mood disorder (HCC) Long Term Goal(s): Knowledge of disease and therapeutic regimen to maintain health will improve  Short Term Goals: Ability to remain free from injury will improve, Ability to verbalize frustration and anger appropriately will improve, Ability to demonstrate self-control, Ability to participate in decision making will improve, Ability to verbalize feelings will improve, Ability to disclose and discuss suicidal ideas, Ability to identify and develop effective coping behaviors will improve, and Compliance with prescribed medications will  improve  Medication Management: RN will administer medications as ordered by provider, will assess and evaluate patient's response and provide education to patient for prescribed medication. RN will report any adverse and/or side effects to prescribing provider.  Therapeutic Interventions: 1 on 1 counseling sessions, Psychoeducation, Medication administration, Evaluate responses to treatment, Monitor vital signs and CBGs as ordered, Perform/monitor CIWA, COWS, AIMS and Fall Risk screenings as ordered, Perform wound care treatments as ordered.  Evaluation of Outcomes: Progressing   LCSW Treatment Plan for Primary Diagnosis: Substance induced mood disorder (HCC) Long Term Goal(s): Safe transition to appropriate next level of care at discharge, Engage patient in therapeutic group addressing interpersonal concerns.  Short Term Goals: Engage patient in aftercare planning with referrals and resources, Increase social support,  Increase ability to appropriately verbalize feelings, Increase emotional regulation, Facilitate acceptance of mental health diagnosis and concerns, Facilitate patient progression through stages of change regarding substance use diagnoses and concerns, Identify triggers associated with mental health/substance abuse issues, and Increase skills for wellness and recovery  Therapeutic Interventions: Assess for all discharge needs, 1 to 1 time with Social worker, Explore available resources and support systems, Assess for adequacy in community support network, Educate family and significant other(s) on suicide prevention, Complete Psychosocial Assessment, Interpersonal group therapy.  Evaluation of Outcomes: Progressing   Progress in Treatment: Attending groups: Yes. Participating in groups: Yes. Taking medication as prescribed: Yes. Toleration medication: Yes. Family/Significant other contact made: Yes, individual(s) contacted:  sister Patient understands diagnosis:  Yes. Discussing patient identified problems/goals with staff: Yes. Medical problems stabilized or resolved: Yes. Denies suicidal/homicidal ideation: Yes. Issues/concerns per patient self-inventory: Yes. Other:   New problem(s) identified: No, Describe:  none reported  New Short Term/Long Term Goal(s):  Patient Goals:  medication management  Discharge Plan or Barriers: Patient recently admitted. CSW will continue to follow and assess for appropriate referrals and possible discharge planning.  Reason for Continuation of Hospitalization: Depression Medication stabilization  Estimated Length of Stay:3-5 days  Last 3 [place] Suicide Severity Risk Score: Flowsheet Row [facility] ([facility]25/2022 [facility] ED to Hosp-Admission (Discharged) from 07/01/2022 in [facility] GENER[facility] MED[facility]SURG [facility]  C-SSRS RISK CATEGORY No Risk Moderate Risk No Risk       Last PHQ 2/9 Scores:    01/04/2022   10:14 AM 12/22/2021    4:01 PM 11/16/2021    2:18 PM  Depression screen PHQ 2/9  Decreased Interest 0 0 0  Down, Depressed, Hopeless 2 0 0  PHQ - 2 Score 2 0 0  Altered sleeping 3  1  Tired, decreased energy 1  1  Change in appetite 0  0  Feeling bad or failure about yourself  0  0  Trouble concentrating 0  0  Moving slowly or fidgety/restless 0  0  Suicidal thoughts 0  0  PHQ-9 Score 6  2  Difficult doing work/chores   Not difficult at all    Scribe for Treatment Team: Starleen Arms, [place] 08/28/2022 4:20 PM

## 2022-08-29 LAB — SARS CORONAVIRUS 2 BY RT PCR: SARS Coronavirus 2 by RT PCR: NEGATIVE

## 2022-08-29 MED ORDER — QUETIAPINE FUMARATE 150 MG PO TABS: 150.0000 mg | ORAL_TABLET | Freq: Every day | ORAL | 0 refills | Status: DC

## 2022-08-29 MED ORDER — ONDANSETRON HCL 8 MG PO TABS: 8.0000 mg | ORAL_TABLET | Freq: Three times a day (TID) | ORAL | 0 refills | Status: DC | PRN

## 2022-08-29 MED ORDER — POLYETHYLENE GLYCOL 3350 17 G PO PACK: 17.0000 g | PACK | Freq: Every day | ORAL | Status: DC | PRN

## 2022-08-29 MED ORDER — LOPERAMIDE HCL 2 MG PO CAPS
2.0000 mg | ORAL_CAPSULE | ORAL | Status: DC | PRN
  Administered 2022-08-29: 2 mg via ORAL
  Filled 2022-08-29 (×2): qty 1

## 2022-08-29 MED ORDER — NICOTINE 21 MG/24HR TD PT24: 21.0000 mg | MEDICATED_PATCH | Freq: Every day | TRANSDERMAL | 0 refills | Status: DC

## 2022-08-29 MED ORDER — LOPERAMIDE HCL 2 MG PO CAPS: 2.0000 mg | ORAL_CAPSULE | ORAL | Status: DC | PRN

## 2022-08-29 MED ORDER — BISMUTH SUBSALICYLATE 262 MG PO CHEW: 524.0000 mg | CHEWABLE_TABLET | ORAL | Status: DC | PRN

## 2022-08-29 MED ORDER — GABAPENTIN 300 MG PO CAPS: 300.0000 mg | ORAL_CAPSULE | Freq: Three times a day (TID) | ORAL | 0 refills | Status: DC

## 2022-08-29 NOTE — Progress Notes (Signed)
Pt refused Covid test 

## 2022-08-29 NOTE — BHH Suicide Risk Assessment (Signed)
Suicide Risk Assessment  Discharge Assessment    [facility] Discharge Suicide Risk Assessment   Principal Problem: Substance induced mood disorder (HCC) Discharge Diagnoses: Principal Problem:   Substance induced mood disorder (HCC) Active Problems:   Tobacco use disorder   Stimulant use disorder   Alcohol use disorder   PTSD (post-traumatic stress disorder)   Cannabis use disorder   Total Time spent with patient: 15 minutes  Dylan [name] is a 57 y.o., male with a past psychiatric history of  bipolar 1 disorder vs substance-induced mood disorder and PTSD, substance use history of stimulant use disorder, alcohol use disorder, and tobacco use disorder, and significant PMHx of HIV and hepatitis B  who presents to the [facility] Voluntary from behavioral health urgent care [facility]) for evaluation and management of suicidal ideations in the setting of worsening depression.    Subjective on day of discharge: Patient reports onset of vomiting and diarrhea that started around 3 AM this morning.  Reports several bouts of vomiting, and diarrhea as recent as 30 minutes prior to interview.  He is also reporting chills, denies fever or myalgias.  He reports nausea that comes and goes.  Agrees to have a COVID test done prior to discharge.  On day of discharge, patient is anxious to get back home and take care of his dog.  He denies any suicidal ideations, denies homicidal ideation.  Patient denies auditory and visual hallucinations.  There is no evidence of paranoid ideations or delusional thought processes.  Hospital Course: During the patient's hospitalization, patient had extensive initial psychiatric evaluation, and follow-up psychiatric evaluations every day.  Psychiatric diagnoses provided upon initial assessment:  Substance-induced mood disorder vs bipolar I disorder PTSD Alcohol use disorder, recurrent, severe Stimulant use disorder Tobacco use disorder Cannabis use  disorder  Patient's psychiatric medications were adjusted on admission:  No psychotropic meds started on admission  During the hospitalization, other adjustments were made to the patient's psychiatric medication regimen:  Restart home Seroquel 150 mg nightly for sleep and added benefit of treatment of underlying bipolar depression  In order of Seroquel 100 mg was prescribed for increased agitation, which the patient refused Restart gabapentin 300 mg 3 times daily for anxiety, neuropathy, and agitation  Patient's care was discussed during the interdisciplinary team meeting every day during the hospitalization.  The patient denies having side effects to prescribed psychiatric medication.  Gradually, patient started adjusting to milieu. The patient was evaluated each day by a clinical provider to ascertain response to treatment. Improvement was noted by the patient's report of decreasing symptoms, improved sleep and appetite, affect, medication tolerance, behavior, and participation in unit programming.  Patient was asked each day to complete a self inventory noting mood, mental status, pain, new symptoms, anxiety and concerns.    Symptoms were reported as significantly decreased or resolved completely by discharge.   On day of discharge, the patient reports that their mood is stable. The patient denied having suicidal thoughts for more than 48 hours prior to discharge.  Patient denies having homicidal thoughts.  Patient denies having auditory hallucinations.  Patient denies any visual hallucinations or other symptoms of psychosis. The patient was motivated to continue taking medication with a goal of continued improvement in mental health.   Due to extenuating circumstances necessitating an early discharge, the patient has been provided with a comprehensive safety plan and has been connected with appropriate outpatient resources for continued care. All necessary steps were taken to ensure the  patient's well-being and  continuity of care despite the shortened stay. On day of discharge, there are no acute psychiatric symptoms requiring continued inpatient psychiatric hospitalization. On day of discharge a COVID test was performed, resulting negative. Patient was recommended to follow with outpatient medical services to address ongiong somatic symptoms. Vitals remained stable on day of discharge.   The patient reports their target psychiatric symptoms of depression and anxiety responded well to the psychiatric medications, and the patient reports overall benefit other psychiatric hospitalization. Supportive psychotherapy was provided to the patient. The patient also participated in regular group therapy while hospitalized. Coping skills, problem solving as well as relaxation therapies were also part of the unit programming.  Labs were reviewed with the patient, and abnormal results were discussed with the patient.  The patient is able to verbalize their individual safety plan to this provider.  # It is recommended to the patient to continue psychiatric medications as prescribed, after discharge from the hospital.    # It is recommended to the patient to follow up with your outpatient psychiatric provider and PCP.  # It was discussed with the patient, the impact of alcohol, drugs, tobacco have been there overall psychiatric and medical wellbeing, and total abstinence from substance use was recommended the patient.ed.  # Prescriptions provided or sent directly to preferred pharmacy at discharge. Patient agreeable to plan. Given opportunity to ask questions. Appears to feel comfortable with discharge.    # In the event of worsening symptoms, the patient is instructed to call the crisis hotline, 911 and or go to the nearest ED for appropriate evaluation and treatment of symptoms. To follow-up with primary care provider for other medical issues, concerns and or health care needs  # Patient was  discharged Home with a plan to follow up as noted below.    Musculoskeletal: Strength & Muscle Tone: within normal limits Gait & Station: normal Patient leans: N/A  Psychiatric Specialty Exam:  Presentation  General Appearance:  Appropriate for Environment; Casual; Fairly Groomed  Eye Contact: Good  Speech: Clear and Coherent; Normal Rate  Speech Volume: Normal  Handedness: -- (not formally assessed)   Mood and Affect  Mood: -- ("fine today")  Affect: Appropriate; Full Range; Congruent   Thought Process  Thought Processes: Coherent; Linear  Descriptions of Associations:Intact  Orientation:-- (not formally assessed)  Thought Content:Logical; WDL  History of Schizophrenia/Schizoaffective disorder:No  Duration of Psychotic Symptoms:N/A  Hallucinations:Hallucinations: None  Ideas of Reference:None  Suicidal Thoughts:Suicidal Thoughts: No  Homicidal Thoughts:Homicidal Thoughts: No   Sensorium  Memory: Immediate Fair  Judgment: Fair  Insight: Fair   Art therapist  Concentration: Fair  Attention Span: Fair  Recall: [employer] of Knowledge: Fair  Language: Fair   Psychomotor Activity  Psychomotor Activity: Psychomotor Activity: Normal   Assets  Assets: Communication Skills; Desire for Improvement; Resilience   Sleep  Sleep: Sleep: Fair (interrupted by somatic symptoms)    Physical Exam: Physical Exam Constitutional:      General: He is not in acute distress.    Appearance: He is not ill-appearing.  HENT:     Head: Normocephalic and atraumatic.  Pulmonary:     Effort: Pulmonary effort is normal. No respiratory distress.  Musculoskeletal:        General: Normal range of motion.  Skin:    General: Skin is warm and dry.  Neurological:     Mental Status: He is alert.    Review of Systems  Constitutional:  Positive for chills.  Respiratory:  Negative for  shortness of breath.   Cardiovascular:  Negative  for chest pain.  Gastrointestinal:  Positive for abdominal pain, diarrhea and vomiting. Negative for nausea.  Genitourinary:  Negative for dysuria and urgency.  Musculoskeletal:  Negative for myalgias.  Neurological: Negative.   Psychiatric/Behavioral: Negative.     Blood pressure (!) 107/55, pulse 98, temperature 98 F (36.7 C), temperature source Oral, resp. rate 18, height 6\' 2"  (1.88 m), weight 104.5 kg, SpO2 100%. Body mass index is 29.58 kg/m.  Mental Status Per Nursing Assessment::   On Admission:  Suicidal ideation indicated by others, Self-harm behaviors  Demographic factors:  Male Current Mental Status:  Suicidal ideation indicated by others, Self-harm behaviors Loss Factors:  Financial problems / change in socioeconomic status Historical Factors:  Prior suicide attempts Risk Reduction Factors:  Positive social support, Living with another person, especially a relative   Continued Clinical Symptoms:  More than one psychiatric diagnosis Unstable or Poor Therapeutic Relationship Previous Psychiatric Diagnoses and Treatments  Cognitive Features That Contribute To Risk:  None    Suicide Risk:  Mild: There are no identifiable suicide plans, no associated intent, mild dysphoria and related symptoms, good self-control (both objective and subjective assessment), few other risk factors, and identifiable protective factors, including available and accessible social support.    Follow-up Information     Services, [facility]. Go to.   Why: If you are returning to [place], [place] you may go to this provider for therapy and medication management services as a walk in. They only do walk-ins , this provider prefers for you to come between 8am-9am to get an assessment and obtain an appointment Contact information: [address] [place] [address] [phone]         [facility]. Go to.   Specialty: Behavioral Health Why: If you are residing  within [facility], you may go to this provider for an assessment, to obtain therapy and medication management services. For fastest service, please go on Monday through Friday, arrive by 7:00 am to be seen same day. Contact information: [address] [place] [address] [phone]                Plan Of Care/Follow-up recommendations:  Activity: as tolerated  Diet: heart healthy  Other: -Follow-up with your outpatient psychiatric provider -instructions on appointment date, time, and address (location) are provided to you in discharge paperwork.  -Take your psychiatric medications as prescribed at discharge - instructions are provided to you in the discharge paperwork  -Follow-up with outpatient primary care doctor and other specialists -for management of preventative medicine and chronic medical disease, including:  #Tobacco Use Disorder Nicotine patch 21mg /24 hours ordered  Smoking cessation encouraged  -Testing: Follow-up with outpatient provider for abnormal lab results:  NA  -Recommend abstinence from alcohol, tobacco, and other illicit drug use at discharge.   -If your psychiatric symptoms recur, worsen, or if you have side effects to your psychiatric medications, call your outpatient psychiatric provider, 911, [phone] or go to the nearest emergency department.  -If suicidal thoughts recur, call your outpatient psychiatric provider, 911, [phone] or go to the nearest emergency department.     Lorri Frederick, MD 08/29/2022, 11:57 AM

## 2022-08-29 NOTE — Progress Notes (Signed)
   08/29/22 0814  Psych Admission Type (Psych Patients Only)  Admission Status Voluntary/72 hour document signed  Date 72 hour document signed  08/28/22  Time 72 hour document signed  1006  Provider Notified (First and Last Name) (see details for LINK to note) Luan Moore MD  Psychosocial Assessment  Patient Complaints Irritability  Eye Contact Brief  Facial Expression Flat  Affect Irritable  Speech Logical/coherent  Interaction Assertive  Motor Activity Slow  Appearance/Hygiene Unremarkable  Behavior Characteristics Irritable  Mood Irritable  Thought Process  Coherency WDL  Content WDL  Delusions WDL  Perception WDL  Hallucination None reported or observed  Judgment WDL  Confusion WDL  Danger to Self  Current suicidal ideation? Denies

## 2022-08-29 NOTE — BHH Group Notes (Signed)
Spiritual care group on grief and loss facilitated by Chaplain Dyanne Carrel, [facility]  Group Goal: Support / Education around grief and loss  Members engage in facilitated group support and psycho-social education.  Group Description:  Following introductions and group rules, group members engaged in facilitated group dialogue and support around topic of loss, with particular support around experiences of loss in their lives. Group Identified types of loss (relationships / self / things) and identified patterns, circumstances, and changes that precipitate losses. Reflected on thoughts / feelings around loss, normalized grief responses, and recognized variety in grief experience. Group encouraged individual reflection on safe space and on the coping skills that they are already utilizing.  Group drew on Adlerian / Rogerian and narrative framework  Patient Progress: Dylan [name] attended group and actively engaged and participated in group conversation and activities.  He was calm during group session and shared about the loss of his 57 year-old when she was a baby.  She was a twin and he raises her sibling with her other parent.  He also shared about the loss of a relationship due to setting important boundaries related to his values.

## 2022-08-29 NOTE — Progress Notes (Signed)
Pt discharged alert and oriented x 4. Discharge instructions reviewed with pt and pt verbalized understanding. Belongings returned to pt.

## 2022-08-29 NOTE — Transportation (Signed)
08/29/2022  Dylan [name] DOB: May 17, 1965 MRN: [id number]   RIDER WAIVER AND RELEASE OF LIABILITY  For the purposes of helping with transportation needs, [facility] partners with outside transportation providers (taxi companies, [place], Catering manager.) to give [employer] patients or other approved people the choice of on-demand rides [facility]") to our buildings for non-emergency visits.  By using [employer], I, the person signing this document, on behalf of myself and/or any legal minors (in my care using the [employer]), agree:  Science writer given to me are supplied by independent, outside transportation providers who do not work for, or have any affiliation with, [employer]. [facility] is not a transportation company. [facility] has no control over the quality or safety of the rides I get using [employer]. [facility] has no control over whether any outside ride will happen on time or not. [facility] gives no guarantee on the reliability, quality, safety, or availability on any rides, or that no mistakes will happen. I know and accept that traveling by vehicle (car, truck, SVU, Zenaida Niece, bus, taxi, etc.) has risks of serious injuries such as disability, being paralyzed, and death. I know and agree the risk of using [employer] is mine alone, and not [employer]. Transport Services are provided "as is" and as are available. The transportation providers are in charge for all inspections and care of the vehicles used to provide these rides. I agree not to take legal action against [facility], its agents, employees, officers, directors, representatives, insurers, attorneys, assigns, successors, subsidiaries, and affiliates at any time for any reasons related directly or indirectly to using [employer]. I also agree not to take legal action against [facility] or its affiliates for any injury, death, or damage to property caused by or related to using  [employer]. I have read this Waiver and Release of Liability, and I understand the terms used in it and their legal meaning. This Waiver is freely and voluntarily given with the understanding that my right (or any legal minors) to legal action against  relating to [employer] is knowingly given up to use these services.   I attest that I read the Ride Waiver and Release of Liability to Dylan [name], gave Mr. Hauptmann the opportunity to ask questions and answered the questions asked (if any). I affirm that Dylan [name] then provided consent for assistance with transportation.

## 2022-08-29 NOTE — [facility] Suicide Risk Assessment (Signed)
Suicide Risk Assessment  Admission Assessment    [facility] Admission Suicide Risk Assessment  Nursing information obtained from:  Patient Demographic factors:  Male Current Mental Status:  Suicidal ideation indicated by others, Self-harm behaviors Loss Factors:  Financial problems / change in socioeconomic status Historical Factors:  Prior suicide attempts Risk Reduction Factors:  Positive social support, Living with another person, especially a relative  Total Time spent with patient: 1.5 hours Principal Problem: Substance induced mood disorder (HCC) Diagnosis:  Principal Problem:   Substance induced mood disorder (HCC) Active Problems:   Tobacco use disorder   Stimulant use disorder   Alcohol use disorder   PTSD (post-traumatic stress disorder)   Cannabis use disorder   Subjective Data: patient denying suicidal thoughts at present  Continued Clinical Symptoms:  Alcohol Use Disorder Identification Test Final Score (AUDIT): 4 The "Alcohol Use Disorders Identification Test", Guidelines for Use in Primary Care, Second Edition.  World Science writer [facility]). Score between 0-7:  no or low risk or alcohol related problems. Score between 8-15:  moderate risk of alcohol related problems. Score between 16-19:  high risk of alcohol related problems. Score 20 or above:  warrants further diagnostic evaluation for alcohol dependence and treatment.  CLINICAL FACTORS:   More than one psychiatric diagnosis  Musculoskeletal: Strength & Muscle Tone: within normal limits Gait & Station: normal Patient leans: N/A  Psychiatric Specialty Exam  Presentation  General Appearance:  Appropriate for Environment; Casual; Fairly Groomed  Eye Contact: Good  Speech: Clear and Coherent; Normal Rate  Speech Volume: Normal  Handedness: -- (not formally assessed)   Mood and Affect  Mood: -- ("fine today")  Duration of Depression Symptoms:  Greater than two weeks  Affect: Appropriate;  Full Range; Congruent   Thought Process  Thought Processes: Coherent; Linear  Descriptions of Associations: Intact  Orientation: -- (not formally assessed)  Thought Content: Logical; WDL  History of Schizophrenia/Schizoaffective disorder: No  Duration of Psychotic Symptoms: N/A  Hallucinations: Hallucinations: None  Ideas of Reference: None  Suicidal Thoughts: Suicidal Thoughts: No  Homicidal Thoughts: Homicidal Thoughts: No   Sensorium  Memory: Immediate Fair  Judgment: Fair  Insight: Fair   Art therapist  Concentration: Fair  Attention Span: Fair  Recall: [employer] of Knowledge: Fair  Language: Fair   Psychomotor Activity  Psychomotor Activity: Psychomotor Activity: Normal   Assets  Assets: Communication Skills; Desire for Improvement; Resilience   Sleep  Sleep: Sleep: Fair (interrupted by somatic symptoms)   Physical Exam: Physical Exam Vitals and nursing note reviewed.  HENT:     Head: Normocephalic and atraumatic.  Pulmonary:     Effort: Pulmonary effort is normal.  Musculoskeletal:     Cervical back: Normal range of motion.  Neurological:     General: No focal deficit present.     Mental Status: He is alert. Mental status is at baseline.    Review of Systems  Constitutional: Negative.   Respiratory: Negative.    Cardiovascular: Negative.   Gastrointestinal: Negative.   Genitourinary: Negative.   Psychiatric/Behavioral:         Psychiatric subjective data addressed in PSE or HPI / daily subjective report   Blood pressure (!) 107/55, pulse 98, temperature 98 F (36.7 C), temperature source Oral, resp. rate 18, height 6\' 2"  (1.88 m), weight 104.5 kg, SpO2 100%. Body mass index is 29.58 kg/m.  COGNITIVE FEATURES THAT CONTRIBUTE TO RISK:  None    SUICIDE RISK:   Mild:  Suicidal ideation of limited frequency,  intensity, duration, and specificity.  There are no identifiable plans, no associated intent,  mild dysphoria and related symptoms, good self-control (both objective and subjective assessment), few other risk factors, and identifiable protective factors, including available and accessible social support.  PLAN OF CARE: see H&P for full plan of care  I certify that inpatient services furnished can reasonably be expected to improve the patient's condition.   Signed: Augusto Gamble, MD 08/29/2022, 11:28 AM

## 2022-08-29 NOTE — Progress Notes (Signed)
  [facility] Adult Case Management Discharge Plan :  Will you be returning to the same living situation after discharge:  Yes,  Patient will be returning back to his home in [place]  At discharge, do you have transportation home?: Yes,  CSW will arrange a Taxi for patient through [facility]  Do you have the ability to pay for your medications: Yes,  [employer] Plan   Release of information consent forms completed and in the chart;  Patient's signature needed at discharge.  Patient to Follow up at:  Follow-up Information     Services, [facility]. Go to.   Why: If you are returning to [place], [place] you may go to this provider for therapy and medication management services as a walk in. They only do walk-ins , this provider prefers for you to come between 8am-9am to get an assessment and obtain an appointment Contact information: [address] [place] [address] [phone]         [name]. Go to.   Specialty: Behavioral Health Why: If you are residing within [facility], you may go to this provider for an assessment, to obtain therapy and medication management services. For fastest service, please go on Monday through Friday, arrive by 7:00 am to be seen same day. Contact information: [address] [place] [address] [phone]                Next level of care provider has access to [facility] Link:yes  Safety Planning and Suicide Prevention discussed: Yes,  Aeson Sgro  ( sister)      Has patient been referred to the Quitline?: Patient refused referral for treatment  Patient has been referred for addiction treatment: Patient refused referral for treatment.  Isabella Bowens, LCSWA 08/29/2022, 10:04 AM

## 2022-08-29 NOTE — Progress Notes (Signed)
 Pt refuses to fill out suicide safety plan
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
## 2022-08-29 NOTE — Group Note (Signed)
[facility] LCSW Group Therapy Note   Group Date: 08/29/2022 Start Time: 1100 End Time: 1140   Type of Therapy/Topic:  Group Therapy:  Emotion Regulation  Participation Level:  Did Not Attend   Mood:  Description of Group:    The purpose of this group is to assist patients in learning to regulate negative emotions and experience positive emotions. Patients will be guided to discuss ways in which they have been vulnerable to their negative emotions. These vulnerabilities will be juxtaposed with experiences of positive emotions or situations, and patients challenged to use positive emotions to combat negative ones. Special emphasis will be placed on coping with negative emotions in conflict situations, and patients will process healthy conflict resolution skills.  Therapeutic Goals: Patient will identify two positive emotions or experiences to reflect on in order to balance out negative emotions:  Patient will label two or more emotions that they find the most difficult to experience:  Patient will be able to demonstrate positive conflict resolution skills through discussion or role plays:   Summary of Patient Progress:       Therapeutic Modalities:   Cognitive Behavioral Therapy Feelings Identification Dialectical Behavioral Therapy   Starleen Arms, LCSW

## 2022-09-07 ENCOUNTER — Ambulatory Visit: Payer: MEDICAID | Admitting: Internal Medicine

## 2022-09-07 ENCOUNTER — Ambulatory Visit: Payer: MEDICAID | Attending: Family Medicine | Admitting: Family Medicine

## 2022-09-07 ENCOUNTER — Other Ambulatory Visit: Payer: Self-pay | Admitting: Family Medicine

## 2022-09-07 ENCOUNTER — Encounter: Payer: Self-pay | Admitting: Family Medicine

## 2022-09-07 DIAGNOSIS — K649 Unspecified hemorrhoids: Secondary | ICD-10-CM

## 2022-09-07 DIAGNOSIS — E782 Mixed hyperlipidemia: Secondary | ICD-10-CM

## 2022-09-07 DIAGNOSIS — M6282 Rhabdomyolysis: Secondary | ICD-10-CM

## 2022-09-07 DIAGNOSIS — F250 Schizoaffective disorder, bipolar type: Secondary | ICD-10-CM

## 2022-09-07 DIAGNOSIS — H9193 Unspecified hearing loss, bilateral: Secondary | ICD-10-CM | POA: Diagnosis not present

## 2022-09-07 DIAGNOSIS — M19071 Primary osteoarthritis, right ankle and foot: Secondary | ICD-10-CM

## 2022-09-07 DIAGNOSIS — R413 Other amnesia: Secondary | ICD-10-CM

## 2022-09-07 MED ORDER — MELOXICAM 7.5 MG PO TABS: 7.5000 mg | ORAL_TABLET | Freq: Every day | ORAL | 1 refills | Status: DC

## 2022-09-07 MED ORDER — HYDROCORT-PRAMOXINE (PERIANAL) 2.5-1 % EX CREA: 1.0000 | TOPICAL_CREAM | Freq: Three times a day (TID) | CUTANEOUS | 0 refills | Status: DC

## 2022-09-07 NOTE — Progress Notes (Signed)
Virtual Visit via Video Note  I connected with [name], on 09/07/2022 at 10:18 AM by video enabled telemedicine device and verified that I am speaking with the correct person using two identifiers.   Consent: I discussed the limitations, risks, security and privacy concerns of performing an evaluation and management service by telemedicine and the availability of in person appointments. I also discussed with the patient that there may be a patient responsible charge related to this service. The patient expressed understanding and agreed to proceed.   Location of Patient: Work  Government social research officer of Provider: Home office   Persons participating in Telemedicine visit: [name] Dr. Alvis Lemmings     History of Present Illness: Dylan [name] is a 57 y.o. year old male  with a history of HIV (on antiretroviral therapy), hypertension, schizoaffective disorder, substance abuse, Status post left TKA in 12/2021.    Discussed the use of AI scribe software for clinical note transcription with the patient, who gave verbal consent to proceed.   The patient, with a history of rhabdomyolysis, presents for follow-up after two recent hospitalizations for recurrent rhabdomyolysis. He reports leg pain, which is managed with gabapentin. He also reports memory issues and hearing loss, which have been noticed by his significant other and coworkers. 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 He also reports a recurrence of hemorrhoids, for which he had surgery over 20 years ago. He has been trying to stay on his medications, but has had difficulty scheduling appointments due to his doctors being sick. He is also struggling with his hearing, and has been told by multiple people that there is something wrong with it. He works both inside and outside, and drinks three to four 32-ounce bottles of water during his workday. He has been dieting and has lost weight, down  to 230 pounds.  His most recent hospitalization for rhabdomyolysis was 1 month ago at [facility] after he presented with a CK of 7000, treated with IV fluids with a discharge ck of 2091. This was thought to be secondary to dehydration. His job entails working both outside and inside   At his last visit his Statin was also placed on hold and he was referred to the lipid clinic for initiation of a PCSK9 inhibitor.  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   Intentional drug overdose (HCC) 11/26/2021   Neuromuscular disorder (HCC)    neuropathy   Neuropathy    Pre-diabetes    Rhabdomyolysis 02/19/2017   Unilateral primary osteoarthritis, left knee 12/14/2021   Allergies  Allergen Reactions   Penicillins Other (See Comments)    Childhood allergy   Pork-Derived Products Other (See Comments)    Patient preference   Latex Rash   Tape Rash    Current Outpatient Medications on File Prior to Visit  Medication Sig Dispense Refill   aspirin EC 81 MG tablet Take 1 tablet (81 mg total) by mouth daily. Swallow whole. 30 tablet 12   bictegravir-emtricitabine-tenofovir AF (BIKTARVY) 50-200-25 MG TABS tablet Take 1 tablet by mouth daily. 30 tablet 11   bismuth subsalicylate (PEPTO BISMOL)  262 MG chewable tablet Chew 2 tablets (524 mg total) by mouth every 3 (three) hours as needed for diarrhea or loose stools.     gabapentin (NEURONTIN) 300 MG capsule Take 1 capsule (300 mg total) by mouth 3 (three) times daily. 30 capsule 0   loperamide (IMODIUM) 2 MG capsule Take 1 capsule (2 mg total) by mouth as needed for diarrhea or loose stools.     nicotine (NICODERM CQ - DOSED IN MG/24 HOURS) 21 mg/24hr patch Place 1 patch (21 mg total)  onto the skin daily. 28 patch 0   nicotine polacrilex (NICORETTE) 4 MG gum Take 4 mg by mouth as needed for smoking cessation. (Patient not taking: Reported on 08/26/2022)     omeprazole (PRILOSEC) 40 MG capsule Take 1 capsule (40 mg total) by mouth daily. 30 capsule 3   ondansetron (ZOFRAN) 8 MG tablet Take 1 tablet (8 mg total) by mouth every 8 (eight) hours as needed for nausea or vomiting. 20 tablet 0   polyethylene glycol (MIRALAX / GLYCOLAX) 17 g packet Take 17 g by mouth daily as needed for mild constipation or moderate constipation.     QUEtiapine 150 MG TABS Take 150 mg by mouth at bedtime. 30 tablet 0   sildenafil (VIAGRA) 100 MG tablet Take 1 tablet (100 mg total) by mouth daily as needed for erectile dysfunction. At least 24 hours between doses. (Patient not taking: Reported on 08/26/2022) 10 tablet 2   traZODone (DESYREL) 50 MG tablet Take 1 tablet (50 mg total) by mouth at bedtime as needed for sleep. 30 tablet 2   No current facility-administered medications on file prior to visit.    ROS: See HPI  Observations/Objective: Awake, alert, oriented x3 Not in acute distress Normal mood      Latest Ref Rng & Units 08/25/2022    6:00 PM 07/04/2022    4:14 AM 07/03/2022    3:39 AM  CMP  Glucose 70 - 99 mg/dL 90  409  811   BUN 6 - 20 mg/dL 14  9  9    Creatinine 0.61 - 1.24 mg/dL 9.14  7.82  9.56   Sodium 135 - 145 mmol/L 137  138  140   Potassium 3.5 - 5.1 mmol/L 3.6  3.7  3.7   Chloride 98 - 111 mmol/L 103  102  107   CO2 22 - 32 mmol/L 24  27  26    Calcium 8.9 - 10.3 mg/dL 9.7  9.3  8.7   Total Protein 6.5 - 8.1 g/dL 8.3  6.1  5.3   Total Bilirubin 0.3 - 1.2 mg/dL 0.9  0.6  0.4   Alkaline Phos 38 - 126 U/L 91  66  67   AST 15 - 41 U/L 42  216  292   ALT 0 - 44 U/L 43  143  145     Lipid Panel     Component Value Date/Time   CHOL 138 04/02/2022 0637   CHOL 158 02/13/2019 1128   TRIG 258 (H) 04/02/2022 0637   HDL 35 (L) 04/02/2022 0637   HDL 41 02/13/2019 1128    CHOLHDL 3.9 04/02/2022 0637   VLDL 52 (H) 04/02/2022 0637   LDLCALC 51 04/02/2022 0637   LDLCALC 97 02/13/2019 1128   LDLCALC 149 (H) 06/06/2018 1406   LABVLDL 20 02/13/2019 1128    Lab Results  Component Value Date   HGBA1C 6.2 (H) 04/03/2022     Assessment and Plan:  Recurrent Rhabdomyolysis Patient had two episodes of rhabdomyolysis with leg pain. No current leg pain. Patient is on Gabapentin. Possible dehydration as a contributing factor. -Order labs to check CK, liver enzymes, kidney function, and autoimmune markers. -Advise patient to maintain hydration.  Memory Issues Patient reports difficulty with memory. -Order labs to investigate potential causes.  Arthritis in Right Foot Patient reports arthritis in right foot with screws from previous surgery. -Prescribe Meloxicam for arthritis pain.  Hearing Loss Patient reports difficulty understanding words, suggestive of hearing loss. -Refer to audiologist for hearing test.  Hyperlipidemia Patient previously on cholesterol medication, currently stopped. Patient expresses desire to restart cholesterol medication. -Refer to cardiologist for evaluation and management of hyperlipidemia.  Hemorrhoids Patient reports recurrence of hemorrhoids, previously had surgery over 20 years ago. -Advise sitz baths and over-the-counter hemorrhoid cream. -Refer to general surgeon for evaluation and potential surgical intervention.  Behavioral Health Patient reports difficulty in getting appointments with behavioral health and wants to stay on medications. -Advise patient to walk into the behavioral health clinic for immediate attention.        Follow Up Instructions: Keep previously scheduled appt   I discussed the assessment and treatment plan with the patient. The patient was provided an opportunity to ask questions and all were answered. The patient agreed with the plan and demonstrated an understanding of the instructions.    The patient was advised to call back or seek an in-person evaluation if the symptoms worsen or if the condition fails to improve as anticipated.     I provided 22 minutes total of Telehealth time during this encounter including median intraservice time, reviewing previous notes, investigations, ordering medications, medical decision making, coordinating care and patient verbalized understanding at the end of the visit.     Hoy Register, MD, FAAFP. [facility] [place], [place] [phone]   09/07/2022, 10:18 AM

## 2022-09-10 ENCOUNTER — Ambulatory Visit (HOSPITAL_COMMUNITY)
Admission: EM | Admit: 2022-09-10 | Discharge: 2022-09-11 | Disposition: A | Discharge status: Home / Self Care | Payer: MEDICAID | Attending: Nurse Practitioner | Admitting: Nurse Practitioner

## 2022-09-10 DIAGNOSIS — Z21 Asymptomatic human immunodeficiency virus [HIV] infection status: Secondary | ICD-10-CM | POA: Insufficient documentation

## 2022-09-10 DIAGNOSIS — R440 Auditory hallucinations: Secondary | ICD-10-CM | POA: Insufficient documentation

## 2022-09-10 DIAGNOSIS — F319 Bipolar disorder, unspecified: Secondary | ICD-10-CM

## 2022-09-10 DIAGNOSIS — R45851 Suicidal ideations: Secondary | ICD-10-CM | POA: Insufficient documentation

## 2022-09-10 MED ORDER — TRAZODONE HCL 50 MG PO TABS: 50.0000 mg | ORAL_TABLET | Freq: Every evening | ORAL | Status: DC | PRN

## 2022-09-10 MED ORDER — MAGNESIUM HYDROXIDE 400 MG/5ML PO SUSP: 30.0000 mL | Freq: Every day | ORAL | Status: DC | PRN

## 2022-09-10 MED ORDER — HYDROXYZINE HCL 25 MG PO TABS: 25.0000 mg | ORAL_TABLET | Freq: Three times a day (TID) | ORAL | Status: DC | PRN

## 2022-09-10 MED ORDER — ALUM & MAG HYDROXIDE-SIMETH 200-200-20 MG/5ML PO SUSP: 30.0000 mL | ORAL | Status: DC | PRN

## 2022-09-10 MED ORDER — ACETAMINOPHEN 325 MG PO TABS: 650.0000 mg | ORAL_TABLET | Freq: Four times a day (QID) | ORAL | Status: DC | PRN

## 2022-09-10 NOTE — Progress Notes (Signed)
   09/10/22 2329  [facility] Triage Screening (Walk-ins at [facility] only)  How Did You Hear About [place]? Legal System  What Is the Reason for Your Visit/Call Today? Patient presents to [facility] voluntarily by [employer]. Patient reports suicide attempt by almost taking his mothers medications before he was caught by her. Patient reports past suicide attempt 3 years ago by overdose. Patient reports diagnosis of schizophrenia and PTSD. Patient does endorse SI at this time. Patient denies HI. Patient reports auditory hallucinations and hears voices when he is not taking his medications. Patient is urgent.  How Long Has This Been Causing You Problems? <Week  Have You Recently Had Any Thoughts About Hurting Yourself? Yes  How long ago did you have thoughts about hurting yourself? Tonight  Are You Planning to Commit Suicide/Harm Yourself At This time? Yes  Have you Recently Had Thoughts About Hurting Someone Karolee Ohs? No  Are You Planning To Harm Someone At This Time? No  Are you currently experiencing any auditory, visual or other hallucinations? Yes  Please explain the hallucinations you are currently experiencing: Patient reports auditory hallucinations and reports hearing voices in conversations but no commands.  Have You Used Any Alcohol or Drugs in the Past 24 Hours? No  Do you have any current medical co-morbidities that require immediate attention? No  Clinician description of patient physical appearance/behavior: Patient is fairly groomed and cooperative. Patient appears disheveled with preoccupied mental state.  What Do You Feel Would Help You the Most Today? Treatment for Depression or other mood problem  If access to [facility] was not available, would you have sought care in the Emergency Department? Yes  Determination of Need Urgent (48 hours)  Options For Referral Intensive Outpatient Therapy

## 2022-09-10 NOTE — ED Triage Notes (Signed)
Patient presents to [facility] voluntarily by [employer]. Patient reports suicide attempt by almost taking his mothers medications before he was caught by her. Patient reports past suicide attempt 3 years ago by overdose. Patient reports diagnosis of schizophrenia and PTSD. Patient does endorse SI at this time. Patient denies HI. Patient reports auditory hallucinations and hears voices when he is not taking his medications. Patient is urgent.

## 2022-09-10 NOTE — Progress Notes (Signed)
   09/10/22 2329  [facility] Triage Screening (Walk-ins at [facility] only)  How Did You Hear About [place]? Legal System  What Is the Reason for Your Visit/Call Today? Patient presents to [facility] voluntarily by [employer]. Patient reports suicide attempt by almost taking his mothers medication. Patient reports past suicide attempt 3 years ago by overdose. Patient reports diagnosis of bschizophrenia and PTSD. Patient does endorse SI at this time. Patient dneies HI. Patient reports auditory hallucinations and hears voices when he is not taking his medications. Patient is urgent.  How Long Has This Been Causing You Problems? <Week  Have You Recently Had Any Thoughts About Hurting Yourself? Yes  How long ago did you have thoughts about hurting yourself? Tonight  Are You Planning to Commit Suicide/Harm Yourself At This time? Yes  Have you Recently Had Thoughts About Hurting Someone Karolee Ohs? No  Are You Planning To Harm Someone At This Time? No  Are you currently experiencing any auditory, visual or other hallucinations? Yes  Please explain the hallucinations you are currently experiencing: Patient reports auditory hallucinations and reports hearing voices in conversations but no commands.  Have You Used Any Alcohol or Drugs in the Past 24 Hours? No  Do you have any current medical co-morbidities that require immediate attention? No  Clinician description of patient physical appearance/behavior: Patient is fairly groomed and cooperative. Patient appears disheveled with preoccupied mental state.  What Do You Feel Would Help You the Most Today? Treatment for Depression or other mood problem  If access to [facility] was not available, would you have sought care in the Emergency Department? Yes  Determination of Need Urgent (48 hours)  Options For Referral Intensive Outpatient Therapy

## 2022-09-11 ENCOUNTER — Other Ambulatory Visit: Payer: Self-pay

## 2022-09-11 LAB — LIPID PANEL
Cholesterol: 235 mg/dL — ABNORMAL HIGH (ref 0–200)
HDL: 59 mg/dL (ref >40)
LDL Cholesterol: 166 mg/dL — ABNORMAL HIGH (ref 0–99)
Total CHOL/HDL Ratio: 4 ratio
Triglycerides: 52 mg/dL (ref <150)
VLDL: 10 mg/dL (ref 0–40)

## 2022-09-11 LAB — CBC WITH DIFFERENTIAL/PLATELET
Abs Immature Granulocytes: 0.03 10*3/uL (ref 0.00–0.07)
Basophils Absolute: 0 10*3/uL (ref 0.0–0.1)
Basophils Relative: 0 %
Eosinophils Absolute: 0 10*3/uL (ref 0.0–0.5)
Eosinophils Relative: 0 %
HCT: 46.5 % (ref 39.0–52.0)
Hemoglobin: 15.3 g/dL (ref 13.0–17.0)
Immature Granulocytes: 0 %
Lymphocytes Relative: 19 %
Lymphs Abs: 2.2 10*3/uL (ref 0.7–4.0)
MCH: 30.7 pg (ref 26.0–34.0)
MCHC: 32.9 g/dL (ref 30.0–36.0)
MCV: 93.4 fL (ref 80.0–100.0)
Monocytes Absolute: 0.8 10*3/uL (ref 0.1–1.0)
Monocytes Relative: 7 %
Neutro Abs: 8.4 10*3/uL — ABNORMAL HIGH (ref 1.7–7.7)
Neutrophils Relative %: 74 %
Platelets: 223 10*3/uL (ref 150–400)
RBC: 4.98 MIL/uL (ref 4.22–5.81)
RDW: 13.3 % (ref 11.5–15.5)
WBC: 11.5 10*3/uL — ABNORMAL HIGH (ref 4.0–10.5)
nRBC: 0 % (ref 0.0–0.2)

## 2022-09-11 LAB — COMPREHENSIVE METABOLIC PANEL
ALT: 43 U/L (ref 0–44)
AST: 74 U/L — ABNORMAL HIGH (ref 15–41)
Albumin: 4.9 g/dL (ref 3.5–5.0)
Alkaline Phosphatase: 91 U/L (ref 38–126)
Anion gap: 15 (ref 5–15)
BUN: 25 mg/dL — ABNORMAL HIGH (ref 6–20)
CO2: 24 mmol/L (ref 22–32)
Calcium: 10.2 mg/dL (ref 8.9–10.3)
Chloride: 98 mmol/L (ref 98–111)
Creatinine, Ser: 1.43 mg/dL — ABNORMAL HIGH (ref 0.61–1.24)
GFR, Estimated: 57 mL/min — ABNORMAL LOW (ref >60)
Glucose, Bld: 88 mg/dL (ref 70–99)
Potassium: 4.5 mmol/L (ref 3.5–5.1)
Sodium: 137 mmol/L (ref 135–145)
Total Bilirubin: 1.2 mg/dL (ref 0.3–1.2)
Total Protein: 8.3 g/dL — ABNORMAL HIGH (ref 6.5–8.1)

## 2022-09-11 LAB — ETHANOL: Alcohol, Ethyl (B): <10 mg/dL (ref <10)

## 2022-09-11 LAB — TSH: TSH: 0.837 u[IU]/mL (ref 0.350–4.500)

## 2022-09-11 MED ORDER — PANTOPRAZOLE SODIUM 40 MG PO TBEC
80.0000 mg | DELAYED_RELEASE_TABLET | Freq: Every day | ORAL | Status: DC
  Administered 2022-09-11: 80 mg via ORAL
  Filled 2022-09-11: qty 2

## 2022-09-11 MED ORDER — GABAPENTIN 300 MG PO CAPS
300.0000 mg | ORAL_CAPSULE | Freq: Three times a day (TID) | ORAL | Status: DC
  Administered 2022-09-11: 300 mg via ORAL
  Filled 2022-09-11: qty 1

## 2022-09-11 MED ORDER — QUETIAPINE FUMARATE 50 MG PO TABS: 150.0000 mg | ORAL_TABLET | Freq: Every day | ORAL | Status: DC

## 2022-09-11 MED ORDER — BICTEGRAVIR-EMTRICITAB-TENOFOV 50-200-25 MG PO TABS
1.0000 | ORAL_TABLET | Freq: Every day | ORAL | Status: DC
  Administered 2022-09-11: 1 via ORAL
  Filled 2022-09-11: qty 1

## 2022-09-11 MED ORDER — ASPIRIN 81 MG PO TBEC
81.0000 mg | DELAYED_RELEASE_TABLET | Freq: Every day | ORAL | Status: DC
  Administered 2022-09-11: 81 mg via ORAL
  Filled 2022-09-11: qty 1

## 2022-09-11 NOTE — Discharge Instructions (Signed)

## 2022-09-11 NOTE — ED Provider Notes (Signed)
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 06/02/2022 212  150 - 400 K/uL Final   nRBC 06/02/2022 0.0  0.0 - 0.2 % Final   Neutrophils Relative % 06/02/2022 81  % Final   Neutro Abs 06/02/2022 9.2 (H)  1.7 - 7.7 K/uL Final   Lymphocytes Relative 06/02/2022 12  % Final   Lymphs Abs 06/02/2022 1.4  0.7 - 4.0 K/uL Final   Monocytes Relative 06/02/2022 7  % Final   Monocytes Absolute 06/02/2022 0.8  0.1 - 1.0 K/uL Final   Eosinophils Relative 06/02/2022 0   % Final   Eosinophils Absolute 06/02/2022 0.0  0.0 - 0.5 K/uL Final   Basophils Relative 06/02/2022 0  % Final   Basophils Absolute 06/02/2022 0.0  0.0 - 0.1 K/uL Final   Immature Granulocytes 06/02/2022 0  % Final   Abs Immature Granulocytes 06/02/2022 0.02  0.00 - 0.07 K/uL Final   Performed at [facility], [address] [place], [place] [address]   Heparin Unfractionated 06/02/2022 0.24 (L)  0.30 - 0.70 IU/mL Final   Comment: (NOTE) The clinical reportable range upper limit is being lowered to >1.10 to align with the FDA approved guidance for the current laboratory assay.  If heparin results are below expected values, and patient dosage has  been confirmed, suggest follow up testing of antithrombin III levels. Performed at [facility], [address], [place], [place] [address]    Troponin I (High Sensitivity) 06/02/2022 102 (HH)  <18 ng/L Final   Comment: CRITICAL RESULT CALLED TO, READ BACK BY AND VERIFIED WITH Real Cons. RN @ [phone] 06/02/22 BUTLER J. (NOTE) Elevated high sensitivity troponin I (hsTnI) values and significant  changes across serial measurements may suggest ACS but many other  chronic and acute conditions are known to elevate hsTnI results.  Refer to the "Links" section for chest pain algorithms and additional  guidance. Performed at [facility], [address] [place], [place] [address]    Weight 06/02/2022 3,640  oz Final   Height 06/02/2022 74  in Final   BP 06/02/2022 129/87  mmHg Final   Single Plane A2C EF 06/02/2022 67.3  % Final   Single Plane A4C EF 06/02/2022 60.9  % Final   Calc EF 06/02/2022 63.4  % Final   S' Lateral 06/02/2022 2.80  cm Final   Area-P 1/2 06/02/2022 3.50  cm2 Final   Est EF 06/02/2022 55 - 60%   Final   Heparin Unfractionated 06/03/2022 0.26 (L)  0.30 - 0.70 IU/mL Final   Comment: (NOTE) The clinical reportable range upper limit is being lowered to >1.10 to align with the FDA approved guidance for the  current laboratory assay.  If heparin results are below expected values, and patient dosage has  been confirmed, suggest follow up testing of antithrombin III levels. Performed at [name], [address] [place], [place] [address]    WBC 06/03/2022 8.3  4.0 - 10.5 K/uL Final   RBC 06/03/2022 4.79  4.22 - 5.81 MIL/uL Final   Hemoglobin 06/03/2022 14.6  13.0 - 17.0 g/dL Final   HCT 60/63/0160 43.9  39.0 - 52.0 % Final   MCV 06/03/2022 91.6  80.0 - 100.0 fL Final   MCH 06/03/2022 30.5  26.0 - 34.0 pg Final   MCHC 06/03/2022 33.3  30.0 - 36.0 g/dL Final   RDW 10/93/2355 14.5  11.5 - 15.5 % Final  Platelets 06/03/2022 170  150 - 400 K/uL Final   nRBC 06/03/2022 0.0  0.0 - 0.2 % Final   Performed at [facility], [address] [place], [place] [address]   Sodium 06/03/2022 134 (L)  135 - 145 mmol/L Final   Potassium 06/03/2022 3.8  3.5 - 5.1 mmol/L Final   Chloride 06/03/2022 104  98 - 111 mmol/L Final   CO2 06/03/2022 22  22 - 32 mmol/L Final   Glucose, Bld 06/03/2022 98  70 - 99 mg/dL Final   Glucose reference range applies only to samples taken after fasting for at least 8 hours.   BUN 06/03/2022 30 (H)  6 - 20 mg/dL Final   Creatinine, Ser 06/03/2022 1.41 (H)  0.61 - 1.24 mg/dL Final   Calcium 29/56/2130 8.9  8.9 - 10.3 mg/dL Final   GFR, Estimated 06/03/2022 58 (L)  >60 mL/min Final   Comment: (NOTE) Calculated using the CKD-EPI Creatinine Equation (2021)    Anion gap 06/03/2022 8  5 - 15 Final   Performed at [facility], [address] [place], [place] [address]   Total CK 06/02/2022 3,240 (H)  49 - 397 U/L Final   Performed at [facility], [address], [place], [place] [address]   Heparin Unfractionated 06/03/2022 0.26 (L)  0.30 - 0.70 IU/mL Final   Comment: (NOTE) The clinical reportable range upper limit is being lowered to >1.10 to align with the FDA approved guidance for the current laboratory assay.  If heparin results are below  expected values, and patient dosage has  been confirmed, suggest follow up testing of antithrombin III levels. Performed at [facility], [address], [place], [place] [address]   Admission on 03/31/2022, Discharged on 04/04/2022  Component Date Value Ref Range Status   Total Protein 04/02/2022 6.5  6.5 - 8.1 g/dL Final   Albumin 13/24/4010 3.5  3.5 - 5.0 g/dL Final   AST 27/25/3664 45 (H)  15 - 41 U/L Final   ALT 04/02/2022 44  0 - 44 U/L Final   Alkaline Phosphatase 04/02/2022 89  38 - 126 U/L Final   Total Bilirubin 04/02/2022 0.4  0.3 - 1.2 mg/dL Final   Bilirubin, Direct 04/02/2022 <0.1  0.0 - 0.2 mg/dL Final   Indirect Bilirubin 04/02/2022 NOT CALCULATED  0.3 - 0.9 mg/dL Final   Performed at [facility], [address], [place], [place] [address]   Hepatitis B Surface Ag 04/02/2022 Reactive (A)  NON REACTIVE Final   Comment: Sample Positive for HBsAg. Result confirmed by neutralization. HEALTH DEPARTMENT NOTIFIED    HCV Ab 04/02/2022 NON REACTIVE  NON REACTIVE Final   Comment: (NOTE) Nonreactive HCV antibody screen is consistent with no HCV infections,  unless recent infection is suspected or other evidence exists to indicate HCV infection.     Hep A IgM 04/02/2022 NON REACTIVE  NON REACTIVE Final   Hep B C IgM 04/02/2022 NON REACTIVE  NON REACTIVE Final   Performed at [facility], [address], [place], [place] [address]   Cholesterol 04/02/2022 138  0 - 200 mg/dL Final   Triglycerides 63/87/5643 258 (H)  <150 mg/dL Final   HDL 32/95/1884 35 (L)  >40 mg/dL Final   Total CHOL/HDL Ratio 04/02/2022 3.9  RATIO Final   VLDL 04/02/2022 52 (H)  0 - 40 mg/dL Final   LDL Cholesterol 04/02/2022 51  0 - 99 mg/dL Final  Comment:        Total Cholesterol/HDL:CHD Risk Coronary Heart Disease Risk Table                     Men   Women  1/2 Average Risk   3.4   3.3  Average Risk       5.0   4.4  2 X Average Risk   9.6   7.1  3 X Average Risk   23.4   11.0        Use the calculated Patient Ratio above and the CHD Risk Table to determine the patient's CHD Risk.        ATP III CLASSIFICATION (LDL):  <100     mg/dL   Optimal  161-096  mg/dL   Near or Above                    Optimal  130-159  mg/dL   Borderline  045-409  mg/dL   High  >811     mg/dL   Very High Performed at [facility], [address] [place], [place] [address]    Hgb A1c MFr Bld 04/03/2022 6.2 (H)  4.8 - 5.6 % Final   Comment: (NOTE)         Prediabetes: 5.7 - 6.4         Diabetes: >6.4         Glycemic control for adults with diabetes: <7.0    Mean Plasma Glucose 04/03/2022 131  mg/dL Final   Comment: (NOTE) Performed At: [facility] [address] [place], [place] [id number] Jolene Schimke MD MV:[id number]   Admission on 03/30/2022, Discharged on 03/31/2022  Component Date Value Ref Range Status   SARS Coronavirus 2 by RT PCR 03/30/2022 NEGATIVE  NEGATIVE Final   Influenza A by PCR 03/30/2022 NEGATIVE  NEGATIVE Final   Influenza B by PCR 03/30/2022 NEGATIVE  NEGATIVE Final   Comment: (NOTE) The Xpert Xpress SARS-CoV-2/FLU/RSV plus assay is intended as an aid in the diagnosis of influenza from Nasopharyngeal swab specimens and should not be used as a sole basis for treatment. Nasal washings and aspirates are unacceptable for Xpert Xpress SARS-CoV-2/FLU/RSV testing.  Fact Sheet for Patients: BloggerCourse.com  Fact Sheet for Healthcare Providers: SeriousBroker.it  This test is not yet approved or cleared by the [place] FDA and has been authorized for detection and/or diagnosis of SARS-CoV-2 by FDA under an Emergency Use Authorization (EUA). This EUA will remain in effect (meaning this test can be used) for the duration of the COVID-19 declaration under Section 564(b)(1) of the Act, 21 U.S.C. section 360bbb-3(b)(1), unless the authorization is terminated  or revoked.     Resp Syncytial Virus by PCR 03/30/2022 NEGATIVE  NEGATIVE Final   Comment: (NOTE) Fact Sheet for Patients: BloggerCourse.com  Fact Sheet for Healthcare Providers: SeriousBroker.it  This test is not yet approved or cleared by the [place] FDA and has been authorized for detection and/or diagnosis of SARS-CoV-2 by FDA under an Emergency Use Authorization (EUA). This EUA will remain in effect (meaning this test can be used) for the duration of the COVID-19 declaration under Section 564(b)(1) of the Act, 21 U.S.C. section 360bbb-3(b)(1), unless the authorization is terminated or revoked.  Performed at [facility], [address], [place], [place] [address]    WBC 03/30/2022 8.7  4.0 - 10.5 K/uL Final   RBC 03/30/2022 5.03  4.22 - 5.81 MIL/uL Final   Hemoglobin  03/30/2022 15.0  13.0 - 17.0 g/dL Final   HCT 11/05/2534 45.2  39.0 - 52.0 % Final   MCV 03/30/2022 89.9  80.0 - 100.0 fL Final   MCH 03/30/2022 29.8  26.0 - 34.0 pg Final   MCHC 03/30/2022 33.2  30.0 - 36.0 g/dL Final   RDW 64/40/3474 13.2  11.5 - 15.5 % Final   Platelets 03/30/2022 188  150 - 400 K/uL Final   nRBC 03/30/2022 0.0  0.0 - 0.2 % Final   Neutrophils Relative % 03/30/2022 60  % Final   Neutro Abs 03/30/2022 5.2  1.7 - 7.7 K/uL Final   Lymphocytes Relative 03/30/2022 29  % Final   Lymphs Abs 03/30/2022 2.5  0.7 - 4.0 K/uL Final   Monocytes Relative 03/30/2022 10  % Final   Monocytes Absolute 03/30/2022 0.9  0.1 - 1.0 K/uL Final   Eosinophils Relative 03/30/2022 1  % Final   Eosinophils Absolute 03/30/2022 0.1  0.0 - 0.5 K/uL Final   Basophils Relative 03/30/2022 0  % Final   Basophils Absolute 03/30/2022 0.0  0.0 - 0.1 K/uL Final   Immature Granulocytes 03/30/2022 0  % Final   Abs Immature Granulocytes 03/30/2022 0.02  0.00 - 0.07 K/uL Final   Performed at [facility], [address] [place], [place] [address]   Sodium  03/30/2022 134 (L)  135 - 145 mmol/L Final   Potassium 03/30/2022 3.7  3.5 - 5.1 mmol/L Final   Chloride 03/30/2022 96 (L)  98 - 111 mmol/L Final   CO2 03/30/2022 25  22 - 32 mmol/L Final   Glucose, Bld 03/30/2022 74  70 - 99 mg/dL Final   Glucose reference range applies only to samples taken after fasting for at least 8 hours.   BUN 03/30/2022 17  6 - 20 mg/dL Final   Creatinine, Ser 03/30/2022 1.09  0.61 - 1.24 mg/dL Final   Calcium 38/75/6433 10.2  8.9 - 10.3 mg/dL Final   Total Protein 29/51/8841 8.1  6.5 - 8.1 g/dL Final   Albumin 66/06/3014 4.8  3.5 - 5.0 g/dL Final   AST 01/17/3233 99 (H)  15 - 41 U/L Final   ALT 03/30/2022 50 (H)  0 - 44 U/L Final   Alkaline Phosphatase 03/30/2022 119  38 - 126 U/L Final   Total Bilirubin 03/30/2022 1.0  0.3 - 1.2 mg/dL Final   GFR, Estimated 03/30/2022 >60  >60 mL/min Final   Comment: (NOTE) Calculated using the CKD-EPI Creatinine Equation (2021)    Anion gap 03/30/2022 13  5 - 15 Final   Performed at [facility], [address], [place], [place] [address]   Magnesium 03/30/2022 2.5 (H)  1.7 - 2.4 mg/dL Final   Performed at [facility], [address], [place], [place] [address]   Alcohol, Ethyl (B) 03/30/2022 22 (H)  <10 mg/dL Final   Comment: (NOTE) Lowest detectable limit for serum alcohol is 10 mg/dL.  For medical purposes only. Performed at [facility], [address], [place], [place] [address]    Prolactin 03/30/2022 4.8  3.6 - 25.2 ng/mL Final   Comment: (NOTE) Performed At: [facility] [address] [place], [place] [id number] Jolene Schimke MD OH:[id number]    SARSCOV2ONAVIRUS 2 AG 03/30/2022 NEGATIVE  NEGATIVE Final   Comment: (NOTE) SARS-CoV-2 antigen NOT DETECTED.   Negative results are presumptive.  Negative results do not preclude SARS-CoV-2 infection and should not be used as the sole basis  for treatment or other patient management decisions, including infection  control decisions,  particularly in the presence of clinical signs and  symptoms consistent with COVID-19, or in those who have been in contact with the virus.  Negative results must be combined with clinical observations, patient history, and epidemiological information. The expected result is Negative.  Fact Sheet for Patients: https://www.jennings-kim.com/  Fact Sheet for Healthcare Providers: https://alexander-rogers.biz/  This test is not yet approved or cleared by the [place] FDA and  has been authorized for detection and/or diagnosis of SARS-CoV-2 by FDA under an Emergency Use Authorization (EUA).  This EUA will remain in effect (meaning this test can be used) for the duration of  the COV                          ID-19 declaration under Section 564(b)(1) of the Act, 21 U.S.C. section 360bbb-3(b)(1), unless the authorization is terminated or revoked sooner.     TSH 03/30/2022 1.014  0.350 - 4.500 uIU/mL Final   Comment: Performed by a 3rd Generation assay with a functional sensitivity of <=0.01 uIU/mL. Performed at [facility], [address] [place], [place] [address]     Allergies: Penicillins, Pork-derived products, Latex, and Tape  Medications:  Facility Ordered Medications  Medication   acetaminophen (TYLENOL) tablet 650 mg   traZODone (DESYREL) tablet 50 mg   hydrOXYzine (ATARAX) tablet 25 mg   QUEtiapine (SEROQUEL) tablet 150 mg   pantoprazole (PROTONIX) EC tablet 80 mg   gabapentin (NEURONTIN) capsule 300 mg   bictegravir-emtricitabine-tenofovir AF (BIKTARVY) 50-200-25 MG per tablet 1 tablet   aspirin EC tablet 81 mg   PTA Medications  Medication Sig   bictegravir-emtricitabine-tenofovir AF (BIKTARVY) 50-200-25 MG TABS tablet Take 1 tablet by mouth daily.   omeprazole (PRILOSEC) 40 MG capsule Take 1 capsule (40 mg total) by mouth daily.   aspirin EC 81 MG tablet Take 1 tablet (81 mg total) by mouth daily. Swallow whole.   sildenafil  (VIAGRA) 100 MG tablet Take 1 tablet (100 mg total) by mouth daily as needed for erectile dysfunction. At least 24 hours between doses. (Patient not taking: Reported on 08/26/2022)   nicotine polacrilex (NICORETTE) 4 MG gum Take 4 mg by mouth as needed for smoking cessation. (Patient not taking: Reported on 08/26/2022)   gabapentin (NEURONTIN) 300 MG capsule Take 1 capsule (300 mg total) by mouth 3 (three) times daily.   loperamide (IMODIUM) 2 MG capsule Take 1 capsule (2 mg total) by mouth as needed for diarrhea or loose stools.   nicotine (NICODERM CQ - DOSED IN MG/24 HOURS) 21 mg/24hr patch Place 1 patch (21 mg total) onto the skin daily.   ondansetron (ZOFRAN) 8 MG tablet Take 1 tablet (8 mg total) by mouth every 8 (eight) hours as needed for nausea or vomiting.   polyethylene glycol (MIRALAX / GLYCOLAX) 17 g packet Take 17 g by mouth daily as needed for mild constipation or moderate constipation.   QUEtiapine 150 MG TABS Take 150 mg by mouth at bedtime.   bismuth subsalicylate (PEPTO BISMOL) 262 MG chewable tablet Chew 2 tablets (524 mg total) by mouth every 3 (three) hours as needed for diarrhea or loose stools.   meloxicam (MOBIC) 7.5 MG tablet Take 1 tablet (7.5 mg total) by mouth daily.   hydrocortisone-pramoxine (ANALPRAM HC) 2.5-1 % rectal cream Place 1 Application rectally 3 (three) times daily.      Medical Decision Making  Eduar Couzens is a 57 year old male history of bipolar 1 disorder, stimulant use disorder alcohol use disorder, PTSD, HIV and hep B presenting to [facility] voluntarily via [facility] after attempting to take his mother's medication in a suicide attempt.  Patient reports becoming suicidal to having dreams about his deceased daughter who dies 2. 5 years ago.    Recommendations  Based on my evaluation the patient does not appear to have an emergency medical condition.  Patient will be admitted to [facility] continuous observation for crisis management, stabilization and  safety.  Jasper Riling, NP 09/11/22  2:03 AM

## 2022-09-11 NOTE — ED Notes (Signed)
Pt A&O x 4, presents with suicidal ideation, pt almost ingested his mothers meds before she stopped him. AVH noted, denies HI.  Monitoring for safety.

## 2022-09-11 NOTE — ED Notes (Signed)
Pt sleeping at present, no distress noted.  Monitoring for safety. 

## 2022-09-11 NOTE — ED Provider Notes (Signed)
FBC/OBS ASAP Discharge Summary  Date and Time: 09/11/2022 12:54 PM  Name: Dylan [name]  MRN:  [id number]   Discharge Diagnoses:  Final diagnoses:  Suicidal ideation  Bipolar I disorder (HCC)    Subjective: "I was just depressed..my mom overreacted"  Stay Summary: Dylan [name] is a 57 year old male admitted to [facility] last night secondary to increasing depression and suicidal thoughts. He was brought in by [facility] after he threatened to take his mother's medications in attempt to kill himself. This attempt was precipitated by a dream that he had about his deceased daughter. Patient has a hx of attempts along with inpatient hospitalization. Per chart review, patient was discharged from [facility] on 08/27/2022 for the same presenting symptoms. He was recommended to follow up in outpatient services. Patient reports he tried to schedule an appointment but has not bee able to see a provider.  Patient reports that his mother overreacted when he expressed his feelings about his deceased daughter "I just had this dream about her, I was seeing her in front of me ..". Patient reports he did not mean to kill himself, that he was just feeling sad. He reports that he had just used beer. He reports that he often has these kinds of dreams "and it makes me sad, I see her in my dreams but not in real life".  He admits to using crack/cocaine occasionally and has been drinking alcohol almost daily due to depression. He denies SI/HI/AVH.   Patient is evaluated face-to-face by this NP and chart/nursing notes reviewed. 57 year-old AA male sitting in bed. He is approachable and cooperative throughout this assessment. He appears disheveled with decent hygiene. Alert and oriented x 4. His eye contact is fair. His thought process is organized and goal directed. Patient does not appear to be preoccupied; does not appear to be responding to internal stimuli. Matient denies SI/HI/AVH. He admits that he did express hopelessness but did not  mean to kill himself. Patient reports he often thinks about his daughter  and "it makes me sad, I keep dreaming about her".  Patient reports that his mother thought he was going to take her medications "just because she knew I was feeling sad".  He admits that he had not been feeling well and was using substances increasingly. Patient reports that his depression was getting worse because he has not been able to follow up with a provider after hospital discharge on 08/27/2022. He then relapsed on alcohol and drugs.  He appears to be improving in mood and affect. He is pleasant and calm and has goals for self-improvement. Patient is looking for a new provider to address his depression and substance abuse.he denies current medical complications and reports that he takes his HIV medications on regular basis.   Patient reports that he lives alone but his mother does not live far from him. He reports that she gets worried much about him "because I take care of her".  I consulted with Dr Lucianne Muss and disposition discussed: patient is stable and willing to follow up in outpatient services for medication management and therapy. Patient lives in [place] and he agreed to follow up at [facility] outpatient services. He expressed motivation and denied SI/HI/AVH upon discharge. Additional helpful resources provided.       Total Time spent with patient: 30 minutes  Past Psychiatric History: Bipolar I, GAD, Substance use, depression Past Medical History: HIV, GERD,  Family History: NA Family Psychiatric History: NA Social History: Single, unemployed/disability  Tobacco  Cessation:  N/A, patient does not currently use tobacco products  Current Medications:  Current Facility-Administered Medications  Medication Dose Route Frequency Provider Last Rate Last Admin   acetaminophen (TYLENOL) tablet 650 mg  650 mg Oral Q6H PRN Bobbitt, Shalon E, NP       aspirin EC tablet 81 mg  81 mg Oral Daily Bobbitt, Shalon E, NP    81 mg at 09/11/22 0937   bictegravir-emtricitabine-tenofovir AF (BIKTARVY) 50-200-25 MG per tablet 1 tablet  1 tablet Oral Daily Bobbitt, Shalon E, NP   1 tablet at 09/11/22 0937   gabapentin (NEURONTIN) capsule 300 mg  300 mg Oral TID Bobbitt, Shalon E, NP   300 mg at 09/11/22 9562   hydrOXYzine (ATARAX) tablet 25 mg  25 mg Oral TID PRN Bobbitt, Shalon E, NP       pantoprazole (PROTONIX) EC tablet 80 mg  80 mg Oral Daily Bobbitt, Shalon E, NP   80 mg at 09/11/22 1308   QUEtiapine (SEROQUEL) tablet 150 mg  150 mg Oral QHS Bobbitt, Shalon E, NP       traZODone (DESYREL) tablet 50 mg  50 mg Oral QHS PRN Bobbitt, Shalon E, NP       Current Outpatient Medications  Medication Sig Dispense Refill   aspirin EC 81 MG tablet Take 1 tablet (81 mg total) by mouth daily. Swallow whole. (Patient not taking: Reported on 09/11/2022) 30 tablet 12   bictegravir-emtricitabine-tenofovir AF (BIKTARVY) 50-200-25 MG TABS tablet Take 1 tablet by mouth daily. 30 tablet 11   gabapentin (NEURONTIN) 300 MG capsule Take 1 capsule (300 mg total) by mouth 3 (three) times daily. 30 capsule 0   meloxicam (MOBIC) 7.5 MG tablet Take 1 tablet (7.5 mg total) by mouth daily. (Patient not taking: Reported on 09/11/2022) 30 tablet 1   omeprazole (PRILOSEC) 40 MG capsule Take 1 capsule (40 mg total) by mouth daily. (Patient not taking: Reported on 09/11/2022) 30 capsule 3   QUEtiapine 150 MG TABS Take 150 mg by mouth at bedtime. 30 tablet 0   sildenafil (VIAGRA) 100 MG tablet Take 1 tablet (100 mg total) by mouth daily as needed for erectile dysfunction. At least 24 hours between doses. (Patient not taking: Reported on 08/26/2022) 10 tablet 2   traZODone (DESYREL) 50 MG tablet Take 1 tablet (50 mg total) by mouth at bedtime as needed for sleep. 30 tablet 2    PTA Medications:  Facility Ordered Medications  Medication   acetaminophen (TYLENOL) tablet 650 mg   traZODone (DESYREL) tablet 50 mg   hydrOXYzine (ATARAX) tablet 25 mg    QUEtiapine (SEROQUEL) tablet 150 mg   pantoprazole (PROTONIX) EC tablet 80 mg   gabapentin (NEURONTIN) capsule 300 mg   bictegravir-emtricitabine-tenofovir AF (BIKTARVY) 50-200-25 MG per tablet 1 tablet   aspirin EC tablet 81 mg   PTA Medications  Medication Sig   bictegravir-emtricitabine-tenofovir AF (BIKTARVY) 50-200-25 MG TABS tablet Take 1 tablet by mouth daily.   omeprazole (PRILOSEC) 40 MG capsule Take 1 capsule (40 mg total) by mouth daily. (Patient not taking: Reported on 09/11/2022)   aspirin EC 81 MG tablet Take 1 tablet (81 mg total) by mouth daily. Swallow whole. (Patient not taking: Reported on 09/11/2022)   sildenafil (VIAGRA) 100 MG tablet Take 1 tablet (100 mg total) by mouth daily as needed for erectile dysfunction. At least 24 hours between doses. (Patient not taking: Reported on 08/26/2022)   gabapentin (NEURONTIN) 300 MG capsule Take 1 capsule (300 mg total) by mouth  3 (three) times daily.   QUEtiapine 150 MG TABS Take 150 mg by mouth at bedtime.   meloxicam (MOBIC) 7.5 MG tablet Take 1 tablet (7.5 mg total) by mouth daily. (Patient not taking: Reported on 09/11/2022)       09/11/2022   12:53 PM 01/04/2022   10:14 AM 12/22/2021    4:01 PM  Depression screen PHQ 2/9  Decreased Interest 1 0 0  Down, Depressed, Hopeless 1 2 0  PHQ - 2 Score 2 2 0  Altered sleeping 0 3   Tired, decreased energy 0 1   Change in appetite 0 0   Feeling bad or failure about yourself  1 0   Trouble concentrating 0 0   Moving slowly or fidgety/restless 0 0   Suicidal thoughts 1 0   PHQ-9 Score 4 6   Difficult doing work/chores Somewhat difficult      Flowsheet Row ED from 09/10/2022 in [facility]Dis[facility]  C-SSRS RISK CATEGORY Error: Q3, 4, or 5 should not be populated when Q2 is No No Risk Moderate Risk        Musculoskeletal  Strength & Muscle Tone: within normal limits Gait & Station: normal Patient leans: N/A  Psychiatric Specialty Exam  Presentation  General Appearance:  Casual  Eye Contact: Fair  Speech: Clear and Coherent  Speech Volume: Normal  Handedness: Right   Mood and Affect  Mood: Euthymic  Affect: Appropriate   Thought Process  Thought Processes: Coherent  Descriptions of Associations:Intact  Orientation:Full (Time, Place and Person)  Thought Content:Logical  Diagnosis of Schizophrenia or Schizoaffective disorder in past: No    Hallucinations:Hallucinations: None Description of Auditory Hallucinations: Patient endorsed AH, but would not elaborate  Ideas of Reference:None  Suicidal Thoughts:Suicidal Thoughts: No SI Active Intent and/or Plan: With Intent; With Plan  Homicidal Thoughts:Homicidal Thoughts: No   Sensorium  Memory: Immediate Fair; Recent Fair; Remote Fair  Judgment: Fair  Insight: Fair   Chartered certified accountant: Fair  Attention Span: Fair  Recall: [employer] of Knowledge: Fair  Language: Fair   Psychomotor Activity  Psychomotor Activity: Psychomotor Activity: Normal   Assets  Assets: Manufacturing systems engineer; Desire for Improvement; Housing; Social Support   Sleep  Sleep: Sleep: Good Number of Hours of Sleep: 9   Nutritional Assessment (For OBS and FBC admissions only) Has the patient had a weight loss or gain of 10 pounds or more in the last 3 months?: No Has the patient had a decrease in food intake/or appetite?: No Does the patient have dental problems?: No Does the patient have eating habits or behaviors that may be indicators of an eating disorder including binging or inducing vomiting?: No Has the patient recently lost weight without trying?: 0 Has the patient been eating poorly because of a decreased appetite?: 0 Malnutrition Screening Tool Score: 0    Physical Exam   Physical Exam ROS Blood pressure 109/68, pulse 80, temperature 98.2 F (36.8 C), temperature source Oral, resp. rate 18, SpO2 98%. There is no height or weight on file to calculate BMI.  Demographic Factors:  Divorced or widowed, Low socioeconomic status, Living alone, and Unemployed  Loss Factors: Loss of significant relationship and Decline in physical health  Historical Factors: Prior suicide attempts  Risk Reduction Factors:   Positive social support  Continued Clinical Symptoms:  Bipolar Disorder:   Depressive phase  Depression:   Hopelessness  Cognitive Features That Contribute To Risk:  None    Suicide Risk:  Minimal: No identifiable suicidal ideation.  Patients presenting with no risk factors but with morbid ruminations; may be classified as minimal risk based on the severity of the depressive symptoms  Plan Of Care/Follow-up recommendations:  Activity:  As tolerated Other:  Follow up in outpatient services for medications and therapy  Disposition:  Discharge with recommendations to follow up at [facility]. Additional supportive resources provided  Olin Pia, NP 09/11/2022, 12:54 PM
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
## 2022-09-11 NOTE — ED Notes (Addendum)
Pt approached and sitting upright in recliner. Pt states ''i'm fine. I'd like to go home today '' when questioned about having any suicidal thoughts pt denies. Pt states '' I've tried to follow up outpatient here but they never answer the phone or call me back to make an appointment. '' Pt appears irritable , guarded . Pt offered breakfast and accepted milk, cereal and protein bar. Pt is safe, will con't to monitor.

## 2022-09-11 NOTE — ED Notes (Signed)
Pt resting quietly . Respirations even and unlabored.

## 2022-09-11 NOTE — Progress Notes (Signed)
Order received for discharge. AVS reviewed. Pt belongings returned. Pt denies any SI HI or AV Hallucinations. Pt escorted to cab , voucher provided per NP.

## 2022-09-16 LAB — ATN PROFILE
A -- Beta-amyloid 42/40 Ratio: 0.123 (ref >0.102)
Beta-amyloid 40: 144.02 pg/mL
Beta-amyloid 42: 17.71 pg/mL
N -- NfL, Plasma: 2.11 pg/mL (ref 0.00–3.78)
T -- p-tau181: 1.07 pg/mL — ABNORMAL HIGH (ref 0.00–0.97)

## 2022-09-16 LAB — THYROID PANEL WITH TSH
Free Thyroxine Index: 1.5 (ref 1.2–4.9)
T3 Uptake Ratio: 23 % — ABNORMAL LOW (ref 24–39)
T4, Total: 6.4 ug/dL (ref 4.5–12.0)
TSH: 0.876 u[IU]/mL (ref 0.450–4.500)

## 2022-09-16 LAB — CMP14+EGFR
ALT: 30 IU/L (ref 0–44)
AST: 26 IU/L (ref 0–40)
Albumin: 4.4 g/dL (ref 3.8–4.9)
Alkaline Phosphatase: 89 IU/L (ref 44–121)
BUN/Creatinine Ratio: 15 (ref 9–20)
BUN: 14 mg/dL (ref 6–24)
Bilirubin Total: 0.2 mg/dL (ref 0.0–1.2)
CO2: 23 mmol/L (ref 20–29)
Calcium: 9.5 mg/dL (ref 8.7–10.2)
Chloride: 103 mmol/L (ref 96–106)
Creatinine, Ser: 0.92 mg/dL (ref 0.76–1.27)
Globulin, Total: 2.9 g/dL (ref 1.5–4.5)
Glucose: 79 mg/dL (ref 70–99)
Potassium: 4.4 mmol/L (ref 3.5–5.2)
Sodium: 139 mmol/L (ref 134–144)
Total Protein: 7.3 g/dL (ref 6.0–8.5)
eGFR: 97 mL/min/{1.73_m2} (ref >59)

## 2022-09-16 LAB — ALDOLASE: Aldolase: 4 U/L (ref 3.3–10.3)

## 2022-09-16 LAB — ANTI-DNA ANTIBODY, DOUBLE-STRANDED: dsDNA Ab: <1 [IU]/mL (ref 0–9)

## 2022-09-16 LAB — ANA,IFA RA DIAG PNL W/RFLX TIT/PATN
ANA Titer 1: NEGATIVE
Cyclic Citrullin Peptide Ab: 6 U (ref 0–19)
Rheumatoid fact SerPl-aCnc: <10 [IU]/mL (ref <14.0)

## 2022-09-16 LAB — VITAMIN B6: Vitamin B6: 10.9 ug/L (ref 3.4–65.2)

## 2022-09-16 LAB — C-REACTIVE PROTEIN: CRP: <1 mg/L (ref 0–10)

## 2022-09-16 LAB — VITAMIN B12: Vitamin B-12: 466 pg/mL (ref 232–1245)

## 2022-09-16 LAB — APOE ALZHEIMER'S RISK

## 2022-09-16 LAB — TREPONEMAL ANTIBODIES, TPPA: Treponemal Antibodies, TPPA: NONREACTIVE

## 2022-09-16 LAB — RPR W/REFLEX TO TREPSURE: RPR: NONREACTIVE

## 2022-09-16 LAB — CK: Total CK: 742 U/L (ref 41–331)

## 2022-09-16 LAB — SEDIMENTATION RATE: Sed Rate: 14 mm/h (ref 0–30)

## 2022-09-16 LAB — ANTI-SMITH ANTIBODY: ENA SM Ab Ser-aCnc: <0.2 AI (ref 0.0–0.9)

## 2022-09-19 ENCOUNTER — Other Ambulatory Visit: Payer: Self-pay | Admitting: Family Medicine

## 2022-09-19 DIAGNOSIS — R413 Other amnesia: Secondary | ICD-10-CM

## 2022-09-23 ENCOUNTER — Ambulatory Visit (HOSPITAL_COMMUNITY)
Admission: EM | Admit: 2022-09-23 | Discharge: 2022-09-23 | Disposition: A | Discharge status: Home / Self Care | Payer: MEDICAID | Attending: Psychiatry | Admitting: Psychiatry

## 2022-09-23 DIAGNOSIS — F1020 Alcohol dependence, uncomplicated: Secondary | ICD-10-CM | POA: Insufficient documentation

## 2022-09-23 DIAGNOSIS — Z760 Encounter for issue of repeat prescription: Secondary | ICD-10-CM | POA: Insufficient documentation

## 2022-09-23 DIAGNOSIS — F4310 Post-traumatic stress disorder, unspecified: Secondary | ICD-10-CM | POA: Insufficient documentation

## 2022-09-23 DIAGNOSIS — F319 Bipolar disorder, unspecified: Secondary | ICD-10-CM | POA: Insufficient documentation

## 2022-09-23 MED ORDER — QUETIAPINE FUMARATE 150 MG PO TABS: 150.0000 mg | ORAL_TABLET | Freq: Every day | ORAL | 0 refills | Status: DC

## 2022-09-23 NOTE — ED Provider Notes (Signed)
Behavioral Health Urgent Care Medical Screening Exam  Patient Name: Dylan [name] MRN: [id number] Date of Evaluation: 09/23/22 Chief Complaint:  requesting medication refill  Diagnosis:  Final diagnoses:  Medication refill    History of Present illness: Dylan [name] is a 57 y.o. male patient presented to [facility] as a walk in  accompanied by [employer] seeking medication management.  States he has ran out of his medications.  Dylan [name], 57 y.o., male patient seen face to face by this provider, consulted with Dr. Clovis Riley; and chart reviewed on 09/23/22.  Per chart review patient has a past psychiatric history of bipolar 1 disorder versus schizoaffective disorder versus SIMV, PTSD, alcohol use disorder, stimulant use disorder (cocaine and meth).  Patient has services in place with [facility] Dr. Josephina Shih and therapist is Armed forces technical officer.  However he has not been compliant with follow-ups.  Patient has a history of substance use but is denying any substance use at this time.  He does not appear to be impaired.  On evaluation Dylan [name] reports he has not followed up with an outpatient provider since he was discharged from: [facility] for inpatient psychiatric admission.  Reports he has ran out of medications, in addition he states that his mother told him to stop taking medications because he was too sleepy throughout the day.  Reports since he has not been taking his medications in over 1.5 weeks he has begun to feel very anxious.  Reports today he started feeling anxious and called [employer] to bring him to [facility] for medication refill as he does not have transportation.   During evaluation Dylan [name] is observed sitting in the assessment room in no acute distress.  He is casually dressed, appears well-groomed.  He is in no acute distress.  He is alert/oriented x 4, cooperative, calm and attentive.  He has normal speech and behavior.  He is denying any depressive symptoms but does endorse anxiety since he has not  taken his medications in over a week.  He does not appear anxious.  He denies any concerns with appetite or sleep.  He does have a history of suicidal ideations but is denying any at this time.  He verbally contracts for safety.  He does not have access to firearms/weapons.  He denies homicidal ideations.  He denies auditory/visual hallucinations.  He does not appear to be responding to internal/external stimuli.  Objectively there is no evidence of psychosis, mania, or delusional thinking.  He is able to answer questions appropriately.  Provided patient with a 4-day refill for Seroquel 150 mg nightly, this is the medication he states that he has ran out of.  Instructed patient to present to [facility] outpatient services on the second floor Monday morning at 715 to present as a walk-in to see provider.  Patient is in agreement.  Patient then asked if we could provide transportation back to [place].  In addition patient's phone has no charge on it.  There is no android charger that could be located.  Contacted patient's sister Dylan [name] with patient's permission [phone].  States she just spoke to [place] this morning and sent him money via cash app.  She is willing to have an Benedetto Goad pick patient up from facility and take him back to evening where he resides.    Flowsheet Row ED from 09/23/2022 in [facility] ED from 09/10/2022 in [facility] Admission (Discharged) [facility]  C-SSRS RISK CATEGORY High Risk Error: Q3, 4, or 5 should not be populated when Q2 is No No Risk       Psychiatric Specialty Exam  Presentation  General Appearance:Casual  Eye Contact:Good  Speech:Clear and Coherent; Normal Rate  Speech Volume:Normal  Handedness:Right   Mood and Affect  Mood: Anxious  Affect: Congruent   Thought Process  Thought Processes: Coherent  Descriptions of  Associations:Intact  Orientation:Full (Time, Place and Person)  Thought Content:Logical  Diagnosis of Schizophrenia or Schizoaffective disorder in past: No   Hallucinations:None Patient endorsed AH, but would not elaborate  Ideas of Reference:None  Suicidal Thoughts:No With Intent; With Plan  Homicidal Thoughts:No   Sensorium  Memory: Immediate Good; Remote Good; Recent Good  Judgment: Good  Insight: Good   Executive Functions  Concentration: Good  Attention Span: Good  Recall: Good  Fund of Knowledge: Good  Language: Good   Psychomotor Activity  Psychomotor Activity: Normal   Assets  Assets: Financial Resources/Insurance; Housing; Resilience; Social Support; Physical Health   Sleep  Sleep: Good  Number of hours:  9   Physical Exam: Physical Exam Vitals and nursing note reviewed.  Constitutional:      Appearance: Normal appearance.  HENT:     Head: Normocephalic.     Right Ear: External ear normal.     Left Ear: External ear normal.  Eyes:     General:        Right eye: No discharge.        Left eye: No discharge.     Conjunctiva/sclera: Conjunctivae normal.  Cardiovascular:     Rate and Rhythm: Normal rate.  Musculoskeletal:        General: Normal range of motion.     Cervical back: Normal range of motion.  Neurological:     Mental Status: He is alert and oriented to person, place, and time.  Psychiatric:        Attention and Perception: Attention and perception normal.        Mood and Affect: Affect normal. Mood is anxious.        Speech: Speech normal.        Behavior: Behavior normal. Behavior is cooperative.        Thought Content: Thought content normal.        Cognition and Memory: Cognition normal.        Judgment: Judgment normal.    Review of Systems  Constitutional: Negative.   HENT: Negative.    Eyes: Negative.   Respiratory: Negative.    Cardiovascular: Negative.   Musculoskeletal: Negative.   Skin:  Negative.   Neurological: Negative.   Psychiatric/Behavioral:  The patient is nervous/anxious.    Blood pressure 137/71, pulse 63, temperature 98.5 F (36.9 C), temperature source Oral, resp. rate 18, SpO2 97%. There is no height or weight on file to calculate BMI.  Musculoskeletal: Strength & Muscle Tone: within normal limits Gait & Station: normal Patient leans: N/A   [facility] MSE Discharge Disposition for Follow up and Recommendations: Based on my evaluation the patient does not appear to have an emergency medical condition and can be discharged with resources and follow up care in outpatient services for Medication Management and Individual Therapy  Discharge patient  Provided 4 days prescription for 150 mg Seroquel tablets.   Patient agrees to follow-up with [facility] on Monday to talk with provider and get refills of prescriptions.   Ardis Hughs, NP 09/23/2022, 1:26 PM

## 2022-09-23 NOTE — Progress Notes (Signed)
   09/23/22 1213  [facility] Triage Screening (Walk-ins at [facility] only)  How Did You Hear About [place]? Legal System  What Is the Reason for Your Visit/Call Today? Patient is a 57 year old male who present voluntarily to [facility] escorted by [employer] seeking medication management. Per [employer] he contacted the police stating that he has been off his medication and is in need of assistance. Pt reports 1.5 weeks ago his mother told him to stop taking his medications because the dosage was too strong. Pt states he was supposed to be taking Seroquel, unknown dosage. Pt reports passive SI "sometimes" but denies SI today. He reports sometimes he experiences paranoia feeling like people are after him. Pt denies drug or alcohol use,HI and AVH.  How Long Has This Been Causing You Problems? <Week  Have You Recently Had Any Thoughts About Hurting Yourself? Yes  How long ago did you have thoughts about hurting yourself? passive this week  Are You Planning to Commit Suicide/Harm Yourself At This time? No  Have you Recently Had Thoughts About Hurting Someone Karolee Ohs? No  Are You Planning To Harm Someone At This Time? No  Are you currently experiencing any auditory, visual or other hallucinations? No  Have You Used Any Alcohol or Drugs in the Past 24 Hours? No  Do you have any current medical co-morbidities that require immediate attention? No  Clinician description of patient physical appearance/behavior: nervous, guarded, fairly groomed  What Do You Feel Would Help You the Most Today? Medication(s)  If access to [facility] was not available, would you have sought care in the Emergency Department? No  Determination of Need Routine (7 days)  Options For Referral Outpatient Therapy;Medication Management

## 2022-09-23 NOTE — Discharge Instructions (Addendum)

## 2022-10-11 ENCOUNTER — Ambulatory Visit: Payer: MEDICAID | Admitting: Internal Medicine

## 2022-10-16 ENCOUNTER — Telehealth: Payer: MEDICAID | Admitting: Family Medicine

## 2022-10-30 ENCOUNTER — Telehealth: Payer: Self-pay

## 2022-10-30 NOTE — Telephone Encounter (Signed)
Called [facility] to schedule follow up appointment, he answered but phone connection was poor. Will send MyChart message.   Sandie Ano, RN

## 2022-11-03 ENCOUNTER — Other Ambulatory Visit: Payer: Self-pay

## 2022-11-03 ENCOUNTER — Encounter (HOSPITAL_COMMUNITY): Payer: Self-pay

## 2022-11-03 ENCOUNTER — Observation Stay (HOSPITAL_COMMUNITY): Payer: MEDICAID

## 2022-11-03 ENCOUNTER — Observation Stay (HOSPITAL_COMMUNITY)
Admission: EM | Admit: 2022-11-03 | Discharge: 2022-11-06 | Disposition: A | Discharge status: Home / Self Care | Payer: MEDICAID | Attending: Family Medicine | Admitting: Family Medicine

## 2022-11-03 DIAGNOSIS — R059 Cough, unspecified: Secondary | ICD-10-CM | POA: Diagnosis present

## 2022-11-03 DIAGNOSIS — R051 Acute cough: Secondary | ICD-10-CM | POA: Diagnosis not present

## 2022-11-03 DIAGNOSIS — B20 Human immunodeficiency virus [HIV] disease: Secondary | ICD-10-CM | POA: Diagnosis present

## 2022-11-03 DIAGNOSIS — Z1152 Encounter for screening for COVID-19: Secondary | ICD-10-CM | POA: Insufficient documentation

## 2022-11-03 DIAGNOSIS — Z21 Asymptomatic human immunodeficiency virus [HIV] infection status: Secondary | ICD-10-CM | POA: Insufficient documentation

## 2022-11-03 DIAGNOSIS — Z7982 Long term (current) use of aspirin: Secondary | ICD-10-CM | POA: Diagnosis not present

## 2022-11-03 DIAGNOSIS — R748 Abnormal levels of other serum enzymes: Secondary | ICD-10-CM | POA: Diagnosis present

## 2022-11-03 DIAGNOSIS — Z87891 Personal history of nicotine dependence: Secondary | ICD-10-CM | POA: Diagnosis not present

## 2022-11-03 DIAGNOSIS — B181 Chronic viral hepatitis B without delta-agent: Secondary | ICD-10-CM | POA: Diagnosis present

## 2022-11-03 DIAGNOSIS — I10 Essential (primary) hypertension: Secondary | ICD-10-CM | POA: Diagnosis present

## 2022-11-03 DIAGNOSIS — K739 Chronic hepatitis, unspecified: Secondary | ICD-10-CM | POA: Diagnosis not present

## 2022-11-03 DIAGNOSIS — Z96652 Presence of left artificial knee joint: Secondary | ICD-10-CM | POA: Insufficient documentation

## 2022-11-03 DIAGNOSIS — R531 Weakness: Secondary | ICD-10-CM

## 2022-11-03 DIAGNOSIS — F1910 Other psychoactive substance abuse, uncomplicated: Secondary | ICD-10-CM | POA: Diagnosis present

## 2022-11-03 DIAGNOSIS — M6282 Rhabdomyolysis: Secondary | ICD-10-CM | POA: Diagnosis not present

## 2022-11-03 DIAGNOSIS — G629 Polyneuropathy, unspecified: Secondary | ICD-10-CM

## 2022-11-03 DIAGNOSIS — F319 Bipolar disorder, unspecified: Secondary | ICD-10-CM | POA: Diagnosis present

## 2022-11-03 LAB — RESPIRATORY PANEL BY PCR

## 2022-11-03 LAB — COMPREHENSIVE METABOLIC PANEL
ALT: 48 U/L — ABNORMAL HIGH (ref 0–44)
AST: 73 U/L — ABNORMAL HIGH (ref 15–41)
Albumin: 4.6 g/dL (ref 3.5–5.0)
Alkaline Phosphatase: 99 U/L (ref 38–126)
Anion gap: 12 (ref 5–15)
BUN: 17 mg/dL (ref 6–20)
CO2: 22 mmol/L (ref 22–32)
Calcium: 9.8 mg/dL (ref 8.9–10.3)
Chloride: 101 mmol/L (ref 98–111)
Creatinine, Ser: 1.05 mg/dL (ref 0.61–1.24)
GFR, Estimated: >60 mL/min (ref >60)
Glucose, Bld: 91 mg/dL (ref 70–99)
Potassium: 4 mmol/L (ref 3.5–5.1)
Sodium: 135 mmol/L (ref 135–145)
Total Bilirubin: 0.9 mg/dL (ref 0.3–1.2)
Total Protein: 8.2 g/dL — ABNORMAL HIGH (ref 6.5–8.1)

## 2022-11-03 LAB — URINALYSIS, ROUTINE W REFLEX MICROSCOPIC
Bilirubin Urine: NEGATIVE
Glucose, UA: NEGATIVE mg/dL
Hgb urine dipstick: NEGATIVE
Ketones, ur: 20 mg/dL — AB
Leukocytes,Ua: NEGATIVE
Nitrite: NEGATIVE
Protein, ur: NEGATIVE mg/dL
Specific Gravity, Urine: 1.014 (ref 1.005–1.030)
pH: 6 (ref 5.0–8.0)

## 2022-11-03 LAB — CBC WITH DIFFERENTIAL/PLATELET
Abs Immature Granulocytes: 0.02 10*3/uL (ref 0.00–0.07)
Basophils Absolute: 0 10*3/uL (ref 0.0–0.1)
Basophils Relative: 0 %
Eosinophils Absolute: 0.1 10*3/uL (ref 0.0–0.5)
Eosinophils Relative: 1 %
HCT: 47 % (ref 39.0–52.0)
Hemoglobin: 15.5 g/dL (ref 13.0–17.0)
Immature Granulocytes: 0 %
Lymphocytes Relative: 25 %
Lymphs Abs: 2.4 10*3/uL (ref 0.7–4.0)
MCH: 30.7 pg (ref 26.0–34.0)
MCHC: 33 g/dL (ref 30.0–36.0)
MCV: 93.1 fL (ref 80.0–100.0)
Monocytes Absolute: 0.9 10*3/uL (ref 0.1–1.0)
Monocytes Relative: 9 %
Neutro Abs: 6.2 10*3/uL (ref 1.7–7.7)
Neutrophils Relative %: 65 %
Platelets: 155 10*3/uL (ref 150–400)
RBC: 5.05 MIL/uL (ref 4.22–5.81)
RDW: 13.4 % (ref 11.5–15.5)
WBC: 9.5 10*3/uL (ref 4.0–10.5)
nRBC: 0 % (ref 0.0–0.2)

## 2022-11-03 LAB — RAPID URINE DRUG SCREEN, HOSP PERFORMED
Amphetamines: NOT DETECTED
Barbiturates: NOT DETECTED
Benzodiazepines: NOT DETECTED
Cocaine: POSITIVE — AB
Opiates: NOT DETECTED
Tetrahydrocannabinol: NOT DETECTED

## 2022-11-03 LAB — CK
Total CK: 3329 U/L — ABNORMAL HIGH (ref 49–397)
Total CK: 3241 U/L — ABNORMAL HIGH (ref 49–397)

## 2022-11-03 LAB — SARS CORONAVIRUS 2 BY RT PCR: SARS Coronavirus 2 by RT PCR: NEGATIVE

## 2022-11-03 LAB — SEDIMENTATION RATE: Sed Rate: 10 mm/h (ref 0–16)

## 2022-11-03 MED ORDER — HYDRALAZINE HCL 20 MG/ML IJ SOLN: 10.0000 mg | INTRAMUSCULAR | Status: DC | PRN

## 2022-11-03 MED ORDER — ONDANSETRON HCL 4 MG/2ML IJ SOLN: 4.0000 mg | Freq: Four times a day (QID) | INTRAMUSCULAR | Status: DC | PRN

## 2022-11-03 MED ORDER — ACETAMINOPHEN 650 MG RE SUPP: 650.0000 mg | Freq: Four times a day (QID) | RECTAL | Status: DC | PRN

## 2022-11-03 MED ORDER — SODIUM CHLORIDE 0.9 % IV BOLUS (SEPSIS)
1000.0000 mL | Freq: Once | INTRAVENOUS | Status: AC
  Administered 2022-11-03: 1000 mL via INTRAVENOUS

## 2022-11-03 MED ORDER — SODIUM CHLORIDE 0.9 % IV SOLN: INTRAVENOUS | Status: AC

## 2022-11-03 MED ORDER — BICTEGRAVIR-EMTRICITAB-TENOFOV 50-200-25 MG PO TABS
1.0000 | ORAL_TABLET | Freq: Every day | ORAL | Status: DC
  Administered 2022-11-03 – 2022-11-06 (×4): 1 via ORAL
  Filled 2022-11-03 (×4): qty 1

## 2022-11-03 MED ORDER — ACETAMINOPHEN 325 MG PO TABS
650.0000 mg | ORAL_TABLET | Freq: Once | ORAL | Status: AC
Start: 2022-11-03 — End: 2022-11-03
  Administered 2022-11-03: 650 mg via ORAL
  Filled 2022-11-03: qty 2

## 2022-11-03 MED ORDER — SODIUM CHLORIDE 0.9% FLUSH
3.0000 mL | Freq: Two times a day (BID) | INTRAVENOUS | Status: DC
  Administered 2022-11-03 – 2022-11-06 (×7): 3 mL via INTRAVENOUS

## 2022-11-03 MED ORDER — ASPIRIN 81 MG PO TBEC
81.0000 mg | DELAYED_RELEASE_TABLET | Freq: Every day | ORAL | Status: DC
  Administered 2022-11-03 – 2022-11-06 (×4): 81 mg via ORAL
  Filled 2022-11-03 (×4): qty 1

## 2022-11-03 MED ORDER — ONDANSETRON HCL 4 MG PO TABS
4.0000 mg | ORAL_TABLET | Freq: Four times a day (QID) | ORAL | Status: DC | PRN
  Administered 2022-11-05: 4 mg via ORAL
  Filled 2022-11-03: qty 1

## 2022-11-03 MED ORDER — TRAZODONE HCL 50 MG PO TABS: 50.0000 mg | ORAL_TABLET | Freq: Every evening | ORAL | Status: DC | PRN

## 2022-11-03 MED ORDER — QUETIAPINE FUMARATE 100 MG PO TABS
150.0000 mg | ORAL_TABLET | Freq: Every day | ORAL | Status: DC
  Administered 2022-11-03 – 2022-11-05 (×2): 150 mg via ORAL
  Filled 2022-11-03 (×2): qty 2

## 2022-11-03 MED ORDER — ALBUTEROL SULFATE (2.5 MG/3ML) 0.083% IN NEBU: 2.5000 mg | INHALATION_SOLUTION | Freq: Four times a day (QID) | RESPIRATORY_TRACT | Status: DC | PRN

## 2022-11-03 MED ORDER — PANTOPRAZOLE SODIUM 40 MG PO TBEC
40.0000 mg | DELAYED_RELEASE_TABLET | Freq: Every day | ORAL | Status: DC
  Administered 2022-11-03 – 2022-11-06 (×4): 40 mg via ORAL
  Filled 2022-11-03 (×4): qty 1

## 2022-11-03 MED ORDER — ACETAMINOPHEN 325 MG PO TABS
650.0000 mg | ORAL_TABLET | Freq: Four times a day (QID) | ORAL | Status: DC | PRN
  Administered 2022-11-04 – 2022-11-05 (×2): 650 mg via ORAL
  Filled 2022-11-03 (×2): qty 2

## 2022-11-03 MED ORDER — GABAPENTIN 300 MG PO CAPS
300.0000 mg | ORAL_CAPSULE | Freq: Three times a day (TID) | ORAL | Status: DC
  Administered 2022-11-03 (×2): 300 mg via ORAL
  Filled 2022-11-03 (×2): qty 1

## 2022-11-03 MED ORDER — GABAPENTIN 300 MG PO CAPS
600.0000 mg | ORAL_CAPSULE | Freq: Three times a day (TID) | ORAL | Status: DC
  Administered 2022-11-03 – 2022-11-06 (×9): 600 mg via ORAL
  Filled 2022-11-03 (×9): qty 2

## 2022-11-03 NOTE — ED Notes (Signed)
ED TO INPATIENT HANDOFF REPORT  ED Nurse Name and Phone #: Dahlia Client [phone]  S Name/Age/Gender Dylan [name] 57 y.o. male Room/Bed: 040C/040C  Code Status   Code Status: Full Code  Home/SNF/Other Home Patient oriented to: self, place, time, and situation Is this baseline? Yes   Triage Complete: Triage complete  Chief Complaint Weakness [R53.1]  Triage Note Patient BIB [facility] from home due to leg weakness and dark brown urine. Patient stated he noticed the urine color change within the last 43min-1hr, denies pain with such and states it was for one occurrence. Patient has hx of such in 2019 which required him to be temporarily put on dialysis. Patient states increasing weakness x24 hrs in both legs. Patient is A&Ox4.   Allergies Allergies  Allergen Reactions   Penicillins Other (See Comments)    Childhood allergy   Pork-Derived Products Other (See Comments)    Patient preference   Latex Rash   Tape Rash    Level of Care/Admitting Diagnosis ED Disposition     ED Disposition  Admit   Condition  --   Comment  Hospital Area: [facility] [[id number]]  Level of Care: Telemetry Medical [104]  May place patient in observation at [facility] or [facility] if equivalent level of care is available:: No  Covid Evaluation: Asymptomatic - no recent exposure (last 10 days) testing not required  Diagnosis: Weakness [241835]  Admitting Physician: Clydie Braun [[id number]]  Attending Physician: Clydie Braun [[id number]]          B Medical/Surgery History 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   Intentional drug overdose (HCC) 11/26/2021   Neuromuscular disorder (HCC)    neuropathy    Neuropathy    Pre-diabetes    Rhabdomyolysis 02/19/2017   Unilateral primary osteoarthritis, left knee 12/14/2021   Past Surgical History:  Procedure Laterality Date   FOOT ARTHRODESIS Right 09/17/2019   Procedure: FUSION RIGHT LISFRANC JOINT;  Surgeon: Nadara Mustard, MD;  Location: [facility] OR;  Service: Orthopedics;  Laterality: Right;   FOOT ARTHRODESIS Right 09/2019   HEMORROIDECTOMY     IR FLUORO GUIDE CV LINE RIGHT  02/22/2017   IR US GUIDE VASC ACCESS RIGHT  02/22/2017   TOTAL KNEE ARTHROPLASTY Left 12/14/2021   Procedure: LEFT TOTAL KNEE ARTHROPLASTY;  Surgeon: Nadara Mustard, MD;  Location: [facility] OR;  Service: Orthopedics;  Laterality: Left;   TUMOR REMOVAL     From Chest     A IV Location/Drains/Wounds Patient Lines/Drains/Airways Status     Active Line/Drains/Airways     Name Placement date Placement time Site Days   Peripheral IV 11/03/22 20 G Anterior;Proximal;Right Forearm 11/03/22  0239  Forearm  less than 1            Intake/Output Last 24 hours  Intake/Output Summary (Last 24 hours) at 11/03/2022 1119 Last data filed at 11/03/2022 3474 Gross per 24 hour  Intake 2000 ml  Output --  Net 2000 ml    Labs/Imaging Results for orders placed or performed during the hospital encounter of 11/03/22 (from the past 48 hour(s))  Comprehensive metabolic panel  Status: Abnormal   Collection Time: 11/03/22  2:04 AM  Result Value Ref Range   Sodium 135 135 - 145 mmol/L   Potassium 4.0 3.5 - 5.1 mmol/L   Chloride 101 98 - 111 mmol/L   CO2 22 22 - 32 mmol/L   Glucose, Bld 91 70 - 99 mg/dL    Comment: Glucose reference range applies only to samples taken after fasting for at least 8 hours.   BUN 17 6 - 20 mg/dL   Creatinine, Ser 4.40 0.61 - 1.24 mg/dL   Calcium 9.8 8.9 - 10.2 mg/dL   Total Protein 8.2 (H) 6.5 - 8.1 g/dL   Albumin 4.6 3.5 - 5.0 g/dL   AST 73 (H) 15 - 41 U/L   ALT 48 (H) 0 - 44 U/L   Alkaline Phosphatase 99 38 - 126 U/L   Total Bilirubin 0.9 0.3 - 1.2  mg/dL   GFR, Estimated >72 >53 mL/min    Comment: (NOTE) Calculated using the CKD-EPI Creatinine Equation (2021)    Anion gap 12 5 - 15    Comment: Performed at [facility], [address], [place], [place] [address]  CBC WITH DIFFERENTIAL     Status: None   Collection Time: 11/03/22  2:04 AM  Result Value Ref Range   WBC 9.5 4.0 - 10.5 K/uL   RBC 5.05 4.22 - 5.81 MIL/uL   Hemoglobin 15.5 13.0 - 17.0 g/dL   HCT 34.7 42.5 - 95.6 %   MCV 93.1 80.0 - 100.0 fL   MCH 30.7 26.0 - 34.0 pg   MCHC 33.0 30.0 - 36.0 g/dL   RDW 38.7 56.4 - 33.2 %   Platelets 155 150 - 400 K/uL    Comment: REPEATED TO VERIFY   nRBC 0.0 0.0 - 0.2 %   Neutrophils Relative % 65 %   Neutro Abs 6.2 1.7 - 7.7 K/uL   Lymphocytes Relative 25 %   Lymphs Abs 2.4 0.7 - 4.0 K/uL   Monocytes Relative 9 %   Monocytes Absolute 0.9 0.1 - 1.0 K/uL   Eosinophils Relative 1 %   Eosinophils Absolute 0.1 0.0 - 0.5 K/uL   Basophils Relative 0 %   Basophils Absolute 0.0 0.0 - 0.1 K/uL   Immature Granulocytes 0 %   Abs Immature Granulocytes 0.02 0.00 - 0.07 K/uL    Comment: Performed at [facility], [address], [place], [place] [address]  CK     Status: Abnormal   Collection Time: 11/03/22  2:04 AM  Result Value Ref Range   Total CK 3,329 (H) 49 - 397 U/L    Comment: Performed at [facility], [address], [place], [place] [address]  Urine rapid drug screen (hosp performed)     Status: Abnormal   Collection Time: 11/03/22  5:23 AM  Result Value Ref Range   Opiates NONE DETECTED NONE DETECTED   Cocaine POSITIVE (A) NONE DETECTED   Benzodiazepines NONE DETECTED NONE DETECTED   Amphetamines NONE DETECTED NONE DETECTED   Tetrahydrocannabinol NONE DETECTED NONE DETECTED   Barbiturates NONE DETECTED NONE DETECTED    Comment: (NOTE) DRUG SCREEN FOR MEDICAL PURPOSES ONLY.  IF CONFIRMATION IS NEEDED FOR ANY PURPOSE, NOTIFY LAB WITHIN 5 DAYS.  LOWEST DETECTABLE LIMITS FOR URINE DRUG SCREEN Drug  Class                     Cutoff (ng/mL) Amphetamine and metabolites    1000 Barbiturate  and metabolites    200 Benzodiazepine                 200 Opiates and metabolites        300 Cocaine and metabolites        300 THC                            50 Performed at [facility], [address], [place], [place] [address]   Urinalysis, Routine w reflex microscopic -Urine, Clean Catch     Status: Abnormal   Collection Time: 11/03/22  5:23 AM  Result Value Ref Range   Color, Urine YELLOW YELLOW   APPearance CLEAR CLEAR   Specific Gravity, Urine 1.014 1.005 - 1.030   pH 6.0 5.0 - 8.0   Glucose, UA NEGATIVE NEGATIVE mg/dL   Hgb urine dipstick NEGATIVE NEGATIVE   Bilirubin Urine NEGATIVE NEGATIVE   Ketones, ur 20 (A) NEGATIVE mg/dL   Protein, ur NEGATIVE NEGATIVE mg/dL   Nitrite NEGATIVE NEGATIVE   Leukocytes,Ua NEGATIVE NEGATIVE    Comment: Performed at [facility], [address] [place], [place] [address]  CK     Status: Abnormal   Collection Time: 11/03/22  5:40 AM  Result Value Ref Range   Total CK 3,241 (H) 49 - 397 U/L    Comment: Performed at [facility], [address], [place], [place] [address]  SARS Coronavirus 2 by RT PCR (hospital order, performed in [facility] hospital lab) *cepheid single result test* Nasopharyngeal Swab     Status: None   Collection Time: 11/03/22  9:07 AM   Specimen: Nasopharyngeal Swab; Nasal Swab  Result Value Ref Range   SARS Coronavirus 2 by RT PCR NEGATIVE NEGATIVE    Comment: Performed at [facility], [address], [place], [place] [address]   DG CHEST PORT 1 VIEW  Result Date: 11/03/2022 CLINICAL DATA:  Cough EXAM: PORTABLE CHEST 1 VIEW COMPARISON:  X-ray 06/02/2022 FINDINGS: No consolidation, pneumothorax or effusion. No edema. Normal cardiopericardial silhouette. Overlapping cardiac leads. IMPRESSION: No acute cardiopulmonary disease. Electronically Signed   By: Karen Kays M.D.   On: 11/03/2022 10:02     Pending Labs Unresulted Labs (From admission, onward)     Start     Ordered   11/04/22 0500  CBC  Tomorrow morning,   R        11/03/22 0734   11/04/22 0500  CK  Tomorrow morning,   R        11/03/22 0734   11/04/22 0500  Comprehensive metabolic panel  Tomorrow morning,   R        11/03/22 0854   11/03/22 0854  Respiratory (~20 pathogens) panel by PCR  (Respiratory panel by PCR (~20 pathogens, ~24 hr TAT)  w precautions)  Once,   R       Comments: Cough    11/03/22 0853   11/03/22 0807  Sedimentation rate  Add-on,   AD        11/03/22 0806            Vitals/Pain Today's Vitals   11/03/22 0215 11/03/22 0229 11/03/22 0451 11/03/22 0915  BP: (!) 146/95  (!) 140/94 127/89  Pulse: (!) 40  82 67  Resp: 13  20 18   Temp:  97.9 F (36.6 C) 97.7 F (36.5 C) 97.8 F (36.6 C)  TempSrc:  Oral Oral Oral  SpO2: 93%  100% 100%  Weight:      Height:      PainSc:   7  7     Isolation Precautions No active isolations  Medications Medications  bictegravir-emtricitabine-tenofovir AF (BIKTARVY) 50-200-25 MG per tablet 1 tablet (1 tablet Oral Given 11/03/22 0907)  aspirin EC tablet 81 mg (81 mg Oral Given 11/03/22 0908)  QUEtiapine (SEROQUEL) tablet 150 mg (has no administration in time range)  traZODone (DESYREL) tablet 50 mg (has no administration in time range)  pantoprazole (PROTONIX) EC tablet 40 mg (40 mg Oral Given 11/03/22 0907)  sodium chloride flush (NS) 0.9 % injection 3 mL (3 mLs Intravenous Given 11/03/22 0914)  acetaminophen (TYLENOL) tablet 650 mg (has no administration in time range)    Or  acetaminophen (TYLENOL) suppository 650 mg (has no administration in time range)  ondansetron (ZOFRAN) tablet 4 mg (has no administration in time range)    Or  ondansetron (ZOFRAN) injection 4 mg (has no administration in time range)  albuterol (PROVENTIL) (2.5 MG/3ML) 0.083% nebulizer solution 2.5 mg (has no administration in time range)  hydrALAZINE (APRESOLINE) injection  10 mg (has no administration in time range)  0.9 %  sodium chloride infusion ( Intravenous New Bag/Given 11/03/22 0910)  gabapentin (NEURONTIN) capsule 300 mg (300 mg Oral Given 11/03/22 1100)  sodium chloride 0.9 % bolus 1,000 mL (0 mLs Intravenous Stopped 11/03/22 0523)  sodium chloride 0.9 % bolus 1,000 mL (0 mLs Intravenous Stopped 11/03/22 0523)  acetaminophen (TYLENOL) tablet 650 mg (650 mg Oral Given 11/03/22 0644)    Mobility walks     Focused Assessments     R Recommendations: See Admitting Provider Note  Report given to:   Additional Notes:

## 2022-11-03 NOTE — ED Notes (Signed)
X-ray at bedside

## 2022-11-03 NOTE — Progress Notes (Addendum)
57 year old with history of hypertension, hyperlipidemia, prediabetes, HIV, bipolar disorder, polysubstance abuse including cocaine presented to the emergency department complaining of leg weakness and dark brown urine.  At presentation to ED patient is hemodynamically stable except heart rate was 40 for one episode.   Lab work revealed elevated CK 3241. UA showing ketone positive. UDS positive for cocaine. CBC grossly unremarkable CMP found to have elevated creatinine 1.05 and slight transaminitis otherwise unremarkable. -In the ED patient has been resuscitated with 2 L of LR bolus. -Hospitalist has been contacted for further management of rhabdomyolysis.  Tereasa Coop, MD [facility] 11/03/2022, 6:46 AM

## 2022-11-03 NOTE — Plan of Care (Signed)

## 2022-11-03 NOTE — ED Triage Notes (Signed)
Patient BIB [facility] from home due to leg weakness and dark brown urine. Patient stated he noticed the urine color change within the last 36min-1hr, denies pain with such and states it was for one occurrence. Patient has hx of such in 2019 which required him to be temporarily put on dialysis. Patient states increasing weakness x24 hrs in both legs. Patient is A&Ox4.

## 2022-11-03 NOTE — H&P (Signed)
History and Physical    Patient: Dylan [name] ZOX:[id number] DOB: 1965-03-18 DOA: 11/03/2022 DOS: the patient was seen and examined on 11/03/2022 PCP: Hoy Register, MD  Patient coming from: Home via EMS  Chief Complaint:  Chief Complaint  Patient presents with   Extremity Weakness   HPI: Chandra Tromp is a 57 y.o. male with medical history significant of hypertension, hyperlipidemia, HIV, prediabetes, rhabdomyolysis, hepatitis B, bipolar disorder, anxiety, GERD, and polysubstance abuse who presents with complaints of gradual lower extremity weakness over the last day.  Patient reports having soreness in both legs which is difficult for him to ambulate and causes him to feel off balance.  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  Patient denies any recent strenuous physical activity or falls. Associated symptoms elevated mildly productive cough.  Denies any fever, headache, change in vision, chest pain, palpitations, nausea, vomiting, or diarrhea.  His symptoms reminded him of his prior episodes with rhabdomyolysis for which she came to the hospital for further evaluation.  Review of records note patient had rhabdomyolysis back in 2019 requiring temporary dialysis and subsequently 06/2022 patient reports that he had been riding his bike.  He makes note that he had been taken off of his statin by his primary care provider as they were concerned that it may lead to these incidences.  Patient did admit to using cocaine approximately 3 days ago.  He also makes note of vaping on a regular basis.  In the emergency department patient was found to be afebrile, pulse 60-82, blood pressures elevated up to 146/95, and all other vital signs maintained.  Pulse noted in the 40s, but ECG heart rate monitor readings in the 80s which bradycardia was thought to be inaccurate.  Labs significant for CBC with differential  within normal limits CK 3329-> 3241, AST 73, and ALT 48.  Urinalysis noted 20 ketones, but did not show hemoglobin.  UDS positive for cocaine.  Patient had been given 2 L of normal saline IV fluids and acetaminophen 650 mg p.o..  Review of Systems: As mentioned in the history of present illness. All other systems reviewed and are negative. 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   Intentional drug overdose (HCC) 11/26/2021   Neuromuscular disorder (HCC)    neuropathy   Neuropathy    Pre-diabetes    Rhabdomyolysis 02/19/2017   Unilateral primary osteoarthritis, left knee 12/14/2021   Past Surgical History:  Procedure Laterality Date   FOOT ARTHRODESIS Right 09/17/2019   Procedure: FUSION RIGHT LISFRANC JOINT;  Surgeon: Nadara Mustard, MD;  Location: [facility] OR;  Service: Orthopedics;  Laterality: Right;   FOOT ARTHRODESIS Right 09/2019   HEMORROIDECTOMY     IR FLUORO GUIDE CV LINE RIGHT  02/22/2017   IR US GUIDE VASC ACCESS RIGHT  02/22/2017   TOTAL KNEE ARTHROPLASTY Left 12/14/2021   Procedure: LEFT TOTAL KNEE ARTHROPLASTY;  Surgeon: Nadara Mustard, MD;  Location: [facility] OR;  Service: Orthopedics;  Laterality: Left;   TUMOR REMOVAL     From Chest  Social History:  reports that he has quit smoking. His smoking use included e-cigarettes and cigarettes. He has a 12.5 pack-year smoking history. He has never used smokeless tobacco. He reports current alcohol use. He reports that he does not currently use drugs after having used the following drugs: Cocaine and Marijuana.  Allergies  Allergen Reactions   Penicillins Other (See Comments)    Childhood allergy   Pork-Derived Products Other (See Comments)    Patient preference    Latex Rash   Tape Rash    Family History  Problem Relation Age of Onset   CAD Mother    Diabetes Father    Stroke Father    Colon cancer Maternal Aunt 60   Diabetes Maternal Aunt    Heart disease Maternal Uncle    Heart attack Maternal Uncle    Esophageal cancer Neg Hx    Rectal cancer Neg Hx    Stomach cancer Neg Hx     Prior to Admission medications   Medication Sig Start Date End Date Taking? Authorizing Provider  aspirin EC 81 MG tablet Take 1 tablet (81 mg total) by mouth daily. Swallow whole. 06/04/22  Yes Dorcas Carrow, MD  bictegravir-emtricitabine-tenofovir AF (BIKTARVY) 50-200-25 MG TABS tablet Take 1 tablet by mouth daily. 11/08/21  Yes Judyann Munson, MD  gabapentin (NEURONTIN) 300 MG capsule Take 1 capsule (300 mg total) by mouth 3 (three) times daily. 08/29/22  Yes Carrion-Carrero, Karle Starch, MD  meloxicam (MOBIC) 7.5 MG tablet Take 1 tablet (7.5 mg total) by mouth daily. Patient taking differently: Take 7.5 mg by mouth daily as needed for pain. 09/07/22  Yes Hoy Register, MD  omeprazole (PRILOSEC) 40 MG capsule Take 1 capsule (40 mg total) by mouth daily. 01/04/22  Yes Hoy Register, MD  QUEtiapine Fumarate 150 MG TABS Take 150 mg by mouth at bedtime. 09/23/22 11/03/23 Yes Ardis Hughs, NP  sildenafil (VIAGRA) 100 MG tablet Take 1 tablet (100 mg total) by mouth daily as needed for erectile dysfunction. At least 24 hours between doses. 06/09/22  Yes Hoy Register, MD  traZODone (DESYREL) 50 MG tablet Take 1 tablet (50 mg total) by mouth at bedtime as needed for sleep. 05/15/22 11/03/23 Yes Daine Gip    Physical Exam: Vitals:   11/03/22 0148 11/03/22 0215 11/03/22 0229 11/03/22 0451  BP:  (!) 146/95  (!) 140/94  Pulse:  (!) 40  82  Resp:  13  20  Temp:   97.9 F (36.6 C) 97.7 F (36.5 C)  TempSrc:   Oral Oral  SpO2:  93%  100%  Weight: 104.3 kg     Height: 6\' 2"  (1.88 m)      Constitutional: Middle-age male currently in no acute distress Eyes:  PERRL, lids and conjunctivae normal ENMT: Mucous membranes are moist. Posterior pharynx clear of any exudate or lesions.  Neck: normal, supple, no JVD. Respiratory: clear to auscultation bilaterally, no wheezing, no crackles. Normal respiratory effort.  Able to talk in complete sentences Cardiovascular: Regular rate and rhythm, no murmurs / rubs / gallops. No extremity edema. 2+ pedal pulses.   Abdomen: no tenderness, no masses palpated.  Bowel sounds positive.  Musculoskeletal: no clubbing / cyanosis. No joint deformity upper and lower extremities.  Right foot drop.  Tender to palpation of bilateral lower extremities. Skin: no rashes, lesions, ulcers. No induration Neurologic: CN 2-12 grossly intact.  Strength 4+/5 in both lower extremities. Psychiatric: Normal judgment and insight. Alert and oriented x 3. Normal mood.  Data Reviewed:  EKG revealed sinus rhythm at 74 bpm.  Reviewed labs, imaging, and pertinent records as documented.  Assessment and Plan:  Leg weakness secondary to rhabdomyolysis History of rhabdomyolysis Patient presented with complaints of bilateral leg weakness and soreness over the last day with dark urine.  CK noted to be elevated up to 3329.  Kidney function was within normal limits.  Urinalysis did not show hemoglobin present.  He had been given 2 L of normal saline IV fluids with repeat CK 3241.  Possibly provoked from recent cocaine use.  Patient with prior history of rhabdomyolysis back in 02/2017 that was noted to be nontraumatic where UDS was also checked and positive for cocaine.    However with 06/2022 episode UDS was not checked.  Suspect early rhabdomyolysis. -Admit to a medical telemetry bed -Monitor intake and output -Bedrest -Continu normal saline IV fluids at 150 mL/h -Recheck CK tomorrow morning -Consider formally consulting physical therapy once CK levels improving  Cough Acute.  Patient reports having a intermittently productive cough that recently  started.  Denied having any recent sick contacts.  Lungs sound clear on physical exam. -Check respiratory virus panel -Check chest x-ray  Elevated liver enzymes Acute on chronic. AST 73 and ALT 48.  Levels were last noted to be normal back in August.   The acute elevation may be related to rhabdomyolysis. -Recheck CMP in a.m.  Essential hypertension On admission blood pressures elevated up to 146/95.  Patient not on any blood pressure medications at baseline. -Hydralazine IV as needed  HIV HIV-1 RNA quantitative 21 in 08/2022, and last CD4 count was 638 back in 10/2021.  Patient followed by infectious disease in outpatient setting. -Continue Biktarvy  Peripheral neuropathy Patient reports having significant pain in his feet and legs.  Possibly secondary to patient's history of tobacco and/or alcohol use. -Continue gabapentin  Bipolar disorder -Continue Seroquel   Chronic hepatitis B Patient  has history of chronic viral hepatitis.  Polysubstance abuse UDS positive for cocaine. Patient also reported history of marijuana, tobacco use(currently vapes), and alcohol use. -Continue to counsel patient on need of cessation of licit drug use  GERD -Continue pharmacy substitution of Protonix for omeprazole  DVT prophylaxis: SCDs Advance Care Planning:   Code Status: Full Code    Consults: None  Family Communication: No quested  Severity of Illness: The appropriate patient status for this patient is OBSERVATION. Observation status is judged to be reasonable and necessary in order to provide the required intensity of service to ensure the patient's safety. The patient's presenting symptoms, physical exam findings, and initial radiographic and laboratory data in the context of their medical condition is felt to place them at decreased risk for further clinical deterioration. Furthermore, it is anticipated that the patient will be medically stable for discharge from the hospital within 2  midnights of admission.   Author: Clydie Braun, MD 11/03/2022 7:20 AM  For on call review [email or web address].

## 2022-11-03 NOTE — ED Notes (Signed)
Provider at bedside

## 2022-11-03 NOTE — ED Provider Notes (Signed)
[place] EMERGENCY DEPARTMENT AT [facility] Provider Note   CSN: [id number] Arrival date & time: 11/03/22  0139     History  Chief Complaint  Patient presents with   Extremity Weakness    Dylan [name] is a 57 y.o. male.  The history is provided by the patient.  Patient with history of HIV, bipolar presents for concern for rhabdomyolysis.  Patient reports over the past day has had generalized weakness in his legs and difficulty walking.  He also reports that his urine is dark brown.  He reports previous episodes of nontraumatic rhabdomyolysis and renal failure.  He reports he has previously been on dialysis.  No fevers or vomiting.  No headache, no neck or back pain.  No chest or abdominal pain.  Reports previous history of cocaine abuse but none recently    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   Intentional drug overdose (HCC) 11/26/2021   Neuromuscular disorder (HCC)    neuropathy   Neuropathy    Pre-diabetes    Rhabdomyolysis 02/19/2017   Unilateral primary osteoarthritis, left knee 12/14/2021    Home Medications Prior to Admission medications   Medication Sig Start Date End Date Taking? Authorizing Provider  aspirin EC 81 MG tablet Take 1 tablet (81 mg total) by mouth daily. Swallow whole. 06/04/22  Yes Dorcas Carrow, MD  bictegravir-emtricitabine-tenofovir AF (BIKTARVY) 50-200-25 MG TABS tablet Take 1 tablet by mouth daily. 11/08/21  Yes Judyann Munson, MD  gabapentin (NEURONTIN) 300 MG capsule Take 1 capsule (300 mg total) by mouth 3 (three) times daily. 08/29/22  Yes Carrion-Carrero, Karle Starch, MD  meloxicam (MOBIC) 7.5 MG tablet Take 1 tablet (7.5 mg total) by mouth daily. Patient  taking differently: Take 7.5 mg by mouth daily as needed for pain. 09/07/22  Yes Hoy Register, MD  omeprazole (PRILOSEC) 40 MG capsule Take 1 capsule (40 mg total) by mouth daily. 01/04/22  Yes Hoy Register, MD  QUEtiapine Fumarate 150 MG TABS Take 150 mg by mouth at bedtime. 09/23/22 11/03/23 Yes Ardis Hughs, NP  sildenafil (VIAGRA) 100 MG tablet Take 1 tablet (100 mg total) by mouth daily as needed for erectile dysfunction. At least 24 hours between doses. 06/09/22  Yes Hoy Register, MD  traZODone (DESYREL) 50 MG tablet Take 1 tablet (50 mg total) by mouth at bedtime as needed for sleep. 05/15/22 11/03/23 Yes Bahraini, Sarah A      Allergies    Penicillins, Pork-derived products, Latex, and Tape    Review of Systems   Review of Systems  Constitutional:  Positive for fatigue.  Respiratory:  Negative for shortness of breath.   Cardiovascular:  Negative for chest pain.  Musculoskeletal:  Positive for myalgias. Negative for back pain and neck pain.  Neurological:  Positive for weakness.    Physical Exam Updated Vital Signs BP (!) 140/94 (BP Location: Right Arm)   Pulse 82   Temp 97.7 F (36.5 C) (Oral)   Resp 20   Ht 1.88 m (6\' 2" )   Wt 104.3 kg   SpO2 100%   BMI 29.53 kg/m  Physical Exam CONSTITUTIONAL: Well developed/well nourished HEAD: Normocephalic/atraumatic EYES: EOMI/PERRL  ENMT: Mucous membranes moist NECK: supple no meningeal signs SPINE/BACK:entire spine nontender  CV: S1/S2 noted, no murmurs/rubs/gallops noted LUNGS: Lungs are clear to auscultation bilaterally, no apparent distress ABDOMEN: soft, nontender, no rebound or guarding GU:no cva tenderness NEURO: Awake/alert, equal motor 5/5 strength noted with the following: hip flexion/knee flexion/extension, foot dorsi/plantar flexion, great toe extension intact bilaterally, Pt is able to ambulate unassisted. EXTREMITIES: pulses normal, full ROM Distal pulses equal and intact.  No joint effusions SKIN:  warm, color normal PSYCH: no abnormalities of mood noted, alert and oriented to situation   ED Results / Procedures / Treatments   Labs (all labs ordered are listed, but only abnormal results are displayed) Labs Reviewed  COMPREHENSIVE METABOLIC PANEL - Abnormal; Notable for the following components:      Result Value   Total Protein 8.2 (*)    AST 73 (*)    ALT 48 (*)    All other components within normal limits  RAPID URINE DRUG SCREEN, HOSP PERFORMED - Abnormal; Notable for the following components:   Cocaine POSITIVE (*)    All other components within normal limits  CK - Abnormal; Notable for the following components:   Total CK 3,329 (*)    All other components within normal limits  URINALYSIS, ROUTINE W REFLEX MICROSCOPIC - Abnormal; Notable for the following components:   Ketones, ur 20 (*)    All other components within normal limits  CK - Abnormal; Notable for the following components:   Total CK 3,241 (*)    All other components within normal limits  CBC WITH DIFFERENTIAL/PLATELET    EKG EKG Interpretation Date/Time:  Friday November 03 2022 02:41:56 EDT Ventricular Rate:  74 PR Interval:  154 QRS Duration:  103 QT Interval:  411 QTC Calculation: 456 R Axis:   66  Text Interpretation: Sinus rhythm Consider left atrial enlargement Confirmed by Zadie Rhine ([id number]) on 11/03/2022 2:48:27 AM  Radiology No results found.  Procedures .Critical Care  Performed by: Zadie Rhine, MD Authorized by: Zadie Rhine, MD   Critical care provider statement:    Critical care time (minutes):  60   Critical care start time:  11/03/2022 5:00 AM   Critical care end time:  11/03/2022 6:00 AM   Critical care time was exclusive of:  Separately billable procedures and treating other patients   Critical care was necessary to treat or prevent imminent or life-threatening deterioration of the following conditions:  Metabolic crisis and dehydration   Critical care was  time spent personally by me on the following activities:  Examination of patient, evaluation of patient's response to treatment, ordering and review of laboratory studies, pulse oximetry, re-evaluation of patient's condition and review of old charts   I assumed direction of critical care for this patient from another provider in my specialty: no     Care discussed with: admitting provider       Medications Ordered in ED Medications  sodium chloride 0.9 % bolus 1,000 mL (0 mLs Intravenous Stopped 11/03/22 0523)  sodium chloride 0.9 % bolus 1,000 mL (0 mLs Intravenous Stopped 11/03/22 0523)  acetaminophen (TYLENOL) tablet 650 mg (650 mg Oral Given 11/03/22 0644)    ED Course/ Medical Decision Making/ A&P Clinical Course as of 11/03/22 0646  Fri Nov 03, 2022  4098 Patient with previous history of nontraumatic rhabdomyolysis presents with generalized leg weakness and dark urine.  Initial total CK is around 3300, but normal renal function.  Plan is to give 2 L of fluid  and recheck his CK.  Patient is in no acute distress at this time [DW]  [phone] Patient with minimal improvement of his total CK.  Patient reports generalized weakness  Patient is cocaine positive, therefore this could be cocaine induced rhabdomyolysis Will call for admission [DW]  [phone] D/w dr Janalyn Shy for admission  [DW]    Clinical Course User Index [DW] Zadie Rhine, MD                                 Medical Decision Making Amount and/or Complexity of Data Reviewed Labs: ordered.  Risk OTC drugs. Decision regarding hospitalization.   This patient presents to the ED for concern of generalized weakness, this involves an extensive number of treatment options, and is a complaint that carries with it a high risk of complications and morbidity.  The differential diagnosis includes but is not limited to CVA, intracranial hemorrhage, acute coronary syndrome, renal failure, urinary tract infection, electrolyte disturbance,  pneumonia, rhabdomyolysis    Comorbidities that complicate the patient evaluation: Patient's presentation is complicated by their history of HIV  Social Determinants of Health: Patient's  previous substance use   increases the complexity of managing their presentation  Additional history obtained: Records reviewed previous admission documents  Lab Tests: I Ordered, and personally interpreted labs.  The pertinent results include: Elevated CK consistent with rhabdomyolysis, dehydration   Cardiac Monitoring: The patient was maintained on a cardiac monitor.  I personally viewed and interpreted the cardiac monitor which showed an underlying rhythm of:  sinus rhythm  Medicines ordered and prescription drug management: I ordered medication including 2 L of normal saline for dehydration rhabdomyolysis Reevaluation of the patient after these medicines showed that the patient    stayed the same   Critical Interventions:   IV fluids  Consultations Obtained: I requested consultation with the admitting physician triad , and discussed  findings as well as pertinent plan - they recommend: will admit  Reevaluation: After the interventions noted above, I reevaluated the patient and found that they have :stayed the same  Complexity of problems addressed: Patient's presentation is most consistent with  acute presentation with potential threat to life or bodily function  Disposition: After consideration of the diagnostic results and the patient's response to treatment,  I feel that the patent would benefit from admission   .           Final Clinical Impression(s) / ED Diagnoses Final diagnoses:  Non-traumatic rhabdomyolysis    Rx / DC Orders ED Discharge Orders     None         Zadie Rhine, MD 11/03/22 [phone]

## 2022-11-03 NOTE — ED Notes (Signed)
ED TO INPATIENT HANDOFF REPORT  ED Nurse Name and Phone #: Osvaldo Shipper RN [phone]  S Name/Age/Gender [name] 57 y.o. male Room/Bed: 025C/025C  Code Status   Code Status: Full Code  Home/SNF/Other Home Patient oriented to: self, place, time, and situation Is this baseline? Yes    Triage Complete: Triage complete  Chief Complaint Weakness [R53.1]  Triage Note Patient BIB [facility] from home due to leg weakness and dark brown urine. Patient stated he noticed the urine color change within the last 48min-1hr, denies pain with such and states it was for one occurrence. Patient has hx of such in 2019 which required him to be temporarily put on dialysis. Patient states increasing weakness x24 hrs in both legs. Patient is A&Ox4.   Allergies Allergies  Allergen Reactions   Penicillins Other (See Comments)    Childhood allergy   Pork-Derived Products Other (See Comments)    Patient preference   Latex Rash   Tape Rash    Level of Care/Admitting Diagnosis ED Disposition     ED Disposition  Admit   Condition  --   Comment  Hospital Area: [facility] [[id number]]  Level of Care: Telemetry Medical [104]  May place patient in observation at [facility] or [facility] if equivalent level of care is available:: No  Covid Evaluation: Asymptomatic - no recent exposure (last 10 days) testing not required  Diagnosis: Weakness [241835]  Admitting Physician: Clydie Braun [[id number]]  Attending Physician: Clydie Braun [[id number]]          B Medical/Surgery History 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   Intentional drug overdose (HCC) 11/26/2021   Neuromuscular disorder (HCC)     neuropathy   Neuropathy    Pre-diabetes    Rhabdomyolysis 02/19/2017   Unilateral primary osteoarthritis, left knee 12/14/2021   Past Surgical History:  Procedure Laterality Date   FOOT ARTHRODESIS Right 09/17/2019   Procedure: FUSION RIGHT LISFRANC JOINT;  Surgeon: Nadara Mustard, MD;  Location: [facility] OR;  Service: Orthopedics;  Laterality: Right;   FOOT ARTHRODESIS Right 09/2019   HEMORROIDECTOMY     IR FLUORO GUIDE CV LINE RIGHT  02/22/2017   IR US GUIDE VASC ACCESS RIGHT  02/22/2017   TOTAL KNEE ARTHROPLASTY Left 12/14/2021   Procedure: LEFT TOTAL KNEE ARTHROPLASTY;  Surgeon: Nadara Mustard, MD;  Location: [facility] OR;  Service: Orthopedics;  Laterality: Left;   TUMOR REMOVAL     From Chest     A IV Location/Drains/Wounds Patient Lines/Drains/Airways Status     Active Line/Drains/Airways     Name Placement date Placement time Site Days   Peripheral IV 11/03/22 20 G Anterior;Proximal;Right Forearm 11/03/22  0239  Forearm  less than 1            Intake/Output Last 24 hours  Intake/Output Summary (Last 24 hours) at 11/03/2022 4742 Last data filed at 11/03/2022 5956 Gross per 24 hour  Intake 2000 ml  Output --  Net 2000 ml    Labs/Imaging Results for orders placed or performed during the hospital encounter of 11/03/22 (from the past 48 hour(s))  Comprehensive metabolic panel  Status: Abnormal   Collection Time: 11/03/22  2:04 AM  Result Value Ref Range   Sodium 135 135 - 145 mmol/L   Potassium 4.0 3.5 - 5.1 mmol/L   Chloride 101 98 - 111 mmol/L   CO2 22 22 - 32 mmol/L   Glucose, Bld 91 70 - 99 mg/dL    Comment: Glucose reference range applies only to samples taken after fasting for at least 8 hours.   BUN 17 6 - 20 mg/dL   Creatinine, Ser 8.65 0.61 - 1.24 mg/dL   Calcium 9.8 8.9 - 78.4 mg/dL   Total Protein 8.2 (H) 6.5 - 8.1 g/dL   Albumin 4.6 3.5 - 5.0 g/dL   AST 73 (H) 15 - 41 U/L   ALT 48 (H) 0 - 44 U/L   Alkaline Phosphatase 99 38 - 126 U/L   Total Bilirubin  0.9 0.3 - 1.2 mg/dL   GFR, Estimated >69 >62 mL/min    Comment: (NOTE) Calculated using the CKD-EPI Creatinine Equation (2021)    Anion gap 12 5 - 15    Comment: Performed at [facility], [address], [place], [place] [address]  CBC WITH DIFFERENTIAL     Status: None   Collection Time: 11/03/22  2:04 AM  Result Value Ref Range   WBC 9.5 4.0 - 10.5 K/uL   RBC 5.05 4.22 - 5.81 MIL/uL   Hemoglobin 15.5 13.0 - 17.0 g/dL   HCT 13.2 44.0 - 10.2 %   MCV 93.1 80.0 - 100.0 fL   MCH 30.7 26.0 - 34.0 pg   MCHC 33.0 30.0 - 36.0 g/dL   RDW 72.5 36.6 - 44.0 %   Platelets 155 150 - 400 K/uL    Comment: REPEATED TO VERIFY   nRBC 0.0 0.0 - 0.2 %   Neutrophils Relative % 65 %   Neutro Abs 6.2 1.7 - 7.7 K/uL   Lymphocytes Relative 25 %   Lymphs Abs 2.4 0.7 - 4.0 K/uL   Monocytes Relative 9 %   Monocytes Absolute 0.9 0.1 - 1.0 K/uL   Eosinophils Relative 1 %   Eosinophils Absolute 0.1 0.0 - 0.5 K/uL   Basophils Relative 0 %   Basophils Absolute 0.0 0.0 - 0.1 K/uL   Immature Granulocytes 0 %   Abs Immature Granulocytes 0.02 0.00 - 0.07 K/uL    Comment: Performed at [facility], [address] [place], [place] [address]  CK     Status: Abnormal   Collection Time: 11/03/22  2:04 AM  Result Value Ref Range   Total CK 3,329 (H) 49 - 397 U/L    Comment: Performed at [facility], [address] [place], [place] [address]  Urine rapid drug screen (hosp performed)     Status: Abnormal   Collection Time: 11/03/22  5:23 AM  Result Value Ref Range   Opiates NONE DETECTED NONE DETECTED   Cocaine POSITIVE (A) NONE DETECTED   Benzodiazepines NONE DETECTED NONE DETECTED   Amphetamines NONE DETECTED NONE DETECTED   Tetrahydrocannabinol NONE DETECTED NONE DETECTED   Barbiturates NONE DETECTED NONE DETECTED    Comment: (NOTE) DRUG SCREEN FOR MEDICAL PURPOSES ONLY.  IF CONFIRMATION IS NEEDED FOR ANY PURPOSE, NOTIFY LAB WITHIN 5 DAYS.  LOWEST DETECTABLE LIMITS FOR URINE DRUG  SCREEN Drug Class                     Cutoff (ng/mL) Amphetamine and metabolites    1000 Barbiturate  and metabolites    200 Benzodiazepine                 200 Opiates and metabolites        300 Cocaine and metabolites        300 THC                            50 Performed at [facility], [address] [place], [place] [address]   Urinalysis, Routine w reflex microscopic -Urine, Clean Catch     Status: Abnormal   Collection Time: 11/03/22  5:23 AM  Result Value Ref Range   Color, Urine YELLOW YELLOW   APPearance CLEAR CLEAR   Specific Gravity, Urine 1.014 1.005 - 1.030   pH 6.0 5.0 - 8.0   Glucose, UA NEGATIVE NEGATIVE mg/dL   Hgb urine dipstick NEGATIVE NEGATIVE   Bilirubin Urine NEGATIVE NEGATIVE   Ketones, ur 20 (A) NEGATIVE mg/dL   Protein, ur NEGATIVE NEGATIVE mg/dL   Nitrite NEGATIVE NEGATIVE   Leukocytes,Ua NEGATIVE NEGATIVE    Comment: Performed at [facility], [address] [place], [place] [address]  CK     Status: Abnormal   Collection Time: 11/03/22  5:40 AM  Result Value Ref Range   Total CK 3,241 (H) 49 - 397 U/L    Comment: Performed at [facility], [address], [place], [place] [address]   No results found.  Pending Labs Unresulted Labs (From admission, onward)     Start     Ordered   11/04/22 0500  CBC  Tomorrow morning,   R        11/03/22 0734   11/04/22 0500  Basic metabolic panel  Tomorrow morning,   R        11/03/22 0734   11/04/22 0500  CK  Tomorrow morning,   R        11/03/22 0734   11/03/22 0807  Sedimentation rate  Add-on,   AD        11/03/22 0806            Vitals/Pain Today's Vitals   11/03/22 0148 11/03/22 0215 11/03/22 0229 11/03/22 0451  BP:  (!) 146/95  (!) 140/94  Pulse:  (!) 40  82  Resp:  13  20  Temp:   97.9 F (36.6 C) 97.7 F (36.5 C)  TempSrc:   Oral Oral  SpO2:  93%  100%  Weight: 104.3 kg     Height: 6\' 2"  (1.88 m)     PainSc: 0-No pain   7     Isolation Precautions No active  isolations  Medications Medications  bictegravir-emtricitabine-tenofovir AF (BIKTARVY) 50-200-25 MG per tablet 1 tablet (has no administration in time range)  aspirin EC tablet 81 mg (has no administration in time range)  QUEtiapine (SEROQUEL) tablet 150 mg (has no administration in time range)  traZODone (DESYREL) tablet 50 mg (has no administration in time range)  pantoprazole (PROTONIX) EC tablet 40 mg (has no administration in time range)  sodium chloride flush (NS) 0.9 % injection 3 mL (has no administration in time range)  acetaminophen (TYLENOL) tablet 650 mg (has no administration in time range)    Or  acetaminophen (TYLENOL) suppository 650 mg (has no administration in time range)  ondansetron (ZOFRAN) tablet 4 mg (has no administration in time range)    Or  ondansetron (ZOFRAN) injection 4 mg (has  no administration in time range)  albuterol (PROVENTIL) (2.5 MG/3ML) 0.083% nebulizer solution 2.5 mg (has no administration in time range)  hydrALAZINE (APRESOLINE) injection 10 mg (has no administration in time range)  0.9 %  sodium chloride infusion (has no administration in time range)  sodium chloride 0.9 % bolus 1,000 mL (0 mLs Intravenous Stopped 11/03/22 0523)  sodium chloride 0.9 % bolus 1,000 mL (0 mLs Intravenous Stopped 11/03/22 0523)  acetaminophen (TYLENOL) tablet 650 mg (650 mg Oral Given 11/03/22 0644)    Mobility walks     Focused Assessments Renal Assessment Handoff: Non-traumatic rhabdomyolysis   R Recommendations: See Admitting Provider Note  Report given to:   Additional Notes:

## 2022-11-04 ENCOUNTER — Observation Stay (HOSPITAL_COMMUNITY): Payer: MEDICAID

## 2022-11-04 DIAGNOSIS — M6282 Rhabdomyolysis: Secondary | ICD-10-CM

## 2022-11-04 DIAGNOSIS — F1910 Other psychoactive substance abuse, uncomplicated: Secondary | ICD-10-CM

## 2022-11-04 DIAGNOSIS — R051 Acute cough: Secondary | ICD-10-CM | POA: Diagnosis not present

## 2022-11-04 DIAGNOSIS — R531 Weakness: Secondary | ICD-10-CM

## 2022-11-04 DIAGNOSIS — R748 Abnormal levels of other serum enzymes: Secondary | ICD-10-CM | POA: Diagnosis not present

## 2022-11-04 DIAGNOSIS — G6189 Other inflammatory polyneuropathies: Secondary | ICD-10-CM

## 2022-11-04 DIAGNOSIS — I10 Essential (primary) hypertension: Secondary | ICD-10-CM

## 2022-11-04 LAB — CBC
HCT: 41.8 % (ref 39.0–52.0)
Hemoglobin: 13.8 g/dL (ref 13.0–17.0)
MCH: 30.1 pg (ref 26.0–34.0)
MCHC: 33 g/dL (ref 30.0–36.0)
MCV: 91.3 fL (ref 80.0–100.0)
Platelets: 186 10*3/uL (ref 150–400)
RBC: 4.58 MIL/uL (ref 4.22–5.81)
RDW: 13.2 % (ref 11.5–15.5)
WBC: 4.9 10*3/uL (ref 4.0–10.5)
nRBC: 0 % (ref 0.0–0.2)

## 2022-11-04 LAB — COMPREHENSIVE METABOLIC PANEL
ALT: 38 U/L (ref 0–44)
AST: 52 U/L — ABNORMAL HIGH (ref 15–41)
Albumin: 3.4 g/dL — ABNORMAL LOW (ref 3.5–5.0)
Alkaline Phosphatase: 83 U/L (ref 38–126)
Anion gap: 6 (ref 5–15)
BUN: 13 mg/dL (ref 6–20)
CO2: 26 mmol/L (ref 22–32)
Calcium: 9.1 mg/dL (ref 8.9–10.3)
Chloride: 108 mmol/L (ref 98–111)
Creatinine, Ser: 1.13 mg/dL (ref 0.61–1.24)
GFR, Estimated: >60 mL/min (ref >60)
Glucose, Bld: 90 mg/dL (ref 70–99)
Potassium: 3.8 mmol/L (ref 3.5–5.1)
Sodium: 140 mmol/L (ref 135–145)
Total Bilirubin: 0.6 mg/dL (ref 0.3–1.2)
Total Protein: 6.5 g/dL (ref 6.5–8.1)

## 2022-11-04 LAB — CK: Total CK: 1813 U/L — ABNORMAL HIGH (ref 49–397)

## 2022-11-04 MED ORDER — LACTATED RINGERS IV SOLN: INTRAVENOUS | Status: AC

## 2022-11-04 NOTE — Plan of Care (Signed)

## 2022-11-04 NOTE — Plan of Care (Signed)
  Problem: Clinical Measurements: Goal: Will remain free from infection Outcome: Progressing Goal: Respiratory complications will improve Outcome: Progressing   Problem: Activity: Goal: Risk for activity intolerance will decrease Outcome: Progressing   Problem: Elimination: Goal: Will not experience complications related to bowel motility Outcome: Progressing   Problem: Pain Management: Goal: General experience of comfort will improve Outcome: Progressing

## 2022-11-04 NOTE — Progress Notes (Signed)
[facility]  PROGRESS NOTE  Dylan [name] JYN:[id number] DOB: [birth year] DOA: 11/03/2022 PCP: Hoy Register, MD   Brief HPI:   57 year old male with medical history of hypertension, hyperlipidemia, HIV, prediabetes, rhabdomyolysis, hepatitis B, bipolar disorder, anxiety, GERD, polysubstance abuse presented with complaints of gradual lower extremity weakness for past 1 day.  Patient says that he was having soreness of both lower extremities.  Patient developed rhabdomyolysis in 2019 at that time required temporary hemodialysis.  In the ED UDS was positive for cocaine, CK was elevated to 3329.  Patient admitted with rhabdomyolysis.    Assessment/Plan:   Rhabdomyolysis -Presents with bilateral lower extremity weakness -Found to have CK 3329, started on IV fluids -CK has improved to 1,813 -Continue LR at 100 mL/h -Follow CK level in a.m.  Transaminitis -Mild; likely in setting of rhabdomyolysis -Improved  Hypertension -Patient is not on any medications at home -Continue IV as needed hydralazine  HIV -HIV-1 RNA quantitative 21 in 08/2022, and last CD4 count was 638 back in 10/2021.  - Patient followed by infectious disease in outpatient setting. -Continue Biktarvy  Peripheral neuropathy Patient reports having significant pain in his feet and legs.  - Possibly secondary to patient's history of tobacco and/or alcohol use. -Continue gabapentin   Bipolar disorder -Continue Seroquel    Chronic hepatitis B Patient  has history of chronic viral hepatitis.   Polysubstance abuse -UDS positive for cocaine.  -Patient also reported history of marijuana, tobacco use(currently vapes), and alcohol use. -Continue to counsel patient on need of cessation of licit drug use  GERD -Continue pharmacy substitution of Protonix for omeprazole  Cough -Unclear etiology -Chest x-ray clear  Medications     aspirin EC  81 mg Oral Daily   bictegravir-emtricitabine-tenofovir AF  1 tablet  Oral Daily   gabapentin  600 mg Oral TID   pantoprazole  40 mg Oral Daily   QUEtiapine  150 mg Oral QHS   sodium chloride flush  3 mL Intravenous Q12H     Data Reviewed:   CBG:  No results for input(s): "GLUCAP" in the last 168 hours.  SpO2: (!) 89 %    Vitals:   11/03/22 1239 11/03/22 1647 11/03/22 2119 11/04/22 0900  BP: 116/74 (!) 97/55 92/64 108/61  Pulse: 88 65 60 (!) 56  Resp: 18 18 18 18   Temp:   98.2 F (36.8 C) 98 F (36.7 C)  TempSrc:   Oral   SpO2: 97% 99% 94% (!) 89%  Weight:      Height:          Data Reviewed:  Basic Metabolic Panel: Recent Labs  Lab 11/03/22 0204 11/04/22 1127  NA 135 140  K 4.0 3.8  CL 101 108  CO2 22 26  GLUCOSE 91 90  BUN 17 13  CREATININE 1.05 1.13  CALCIUM 9.8 9.1    CBC: Recent Labs  Lab 11/03/22 0204 11/04/22 1127  WBC 9.5 4.9  NEUTROABS 6.2  --   HGB 15.5 13.8  HCT 47.0 41.8  MCV 93.1 91.3  PLT 155 186    LFT Recent Labs  Lab 11/03/22 0204 11/04/22 1127  AST 73* 52*  ALT 48* 38  ALKPHOS 99 83  BILITOT 0.9 0.6  PROT 8.2* 6.5  ALBUMIN 4.6 3.4*     Antibiotics: Anti-infectives (From admission, onward)    Start     Dose/Rate Route Frequency Ordered Stop   11/03/22 1000  bictegravir-emtricitabine-tenofovir AF (BIKTARVY) 50-200-25 MG per tablet 1 tablet  1 tablet Oral Daily 11/03/22 0734          DVT prophylaxis: SCDs  Code Status: Full code  Family Communication: No family at bedside   CONSULTS    Subjective   Denies any complaints.  Leg soreness has improved.  Objective    Physical Examination:   General-appears in no acute distress Heart-S1-S2, regular, no murmur auscultated Lungs-clear to auscultation bilaterally, no wheezing or crackles auscultated Abdomen-soft, nontender, no organomegaly Extremities-no edema in the lower extremities Neuro-alert, oriented x3, no focal deficit noted  Status is: Inpatient:          Meredeth Ide   [facility] If  7PM-7AM, please contact night-coverage at [email or web address], Office  [phone]   11/04/2022, 2:04 PM  LOS: 0 days

## 2022-11-05 DIAGNOSIS — G6189 Other inflammatory polyneuropathies: Secondary | ICD-10-CM | POA: Diagnosis not present

## 2022-11-05 DIAGNOSIS — R531 Weakness: Secondary | ICD-10-CM | POA: Diagnosis not present

## 2022-11-05 DIAGNOSIS — B181 Chronic viral hepatitis B without delta-agent: Secondary | ICD-10-CM

## 2022-11-05 DIAGNOSIS — M6282 Rhabdomyolysis: Secondary | ICD-10-CM | POA: Diagnosis not present

## 2022-11-05 DIAGNOSIS — R748 Abnormal levels of other serum enzymes: Secondary | ICD-10-CM | POA: Diagnosis not present

## 2022-11-05 LAB — CK: Total CK: 1492 U/L — ABNORMAL HIGH (ref 49–397)

## 2022-11-05 MED ORDER — LACTATED RINGERS IV SOLN: INTRAVENOUS | Status: AC

## 2022-11-05 MED ORDER — KETOROLAC TROMETHAMINE 30 MG/ML IJ SOLN
30.0000 mg | Freq: Once | INTRAMUSCULAR | Status: AC
  Administered 2022-11-05: 30 mg via INTRAVENOUS
  Filled 2022-11-05: qty 1

## 2022-11-05 MED ORDER — OXYCODONE HCL 5 MG PO TABS
5.0000 mg | ORAL_TABLET | Freq: Four times a day (QID) | ORAL | Status: DC | PRN
  Administered 2022-11-05 – 2022-11-06 (×2): 5 mg via ORAL
  Filled 2022-11-05 (×2): qty 1

## 2022-11-05 NOTE — Plan of Care (Signed)
  Problem: Education: Goal: Knowledge of General Education information will improve Description: Including pain rating scale, medication(s)/side effects and non-pharmacologic comfort measures Outcome: Progressing   Problem: Clinical Measurements: Goal: Will remain free from infection Outcome: Progressing Goal: Respiratory complications will improve Outcome: Progressing   Problem: Activity: Goal: Risk for activity intolerance will decrease Outcome: Progressing   Problem: Nutrition: Goal: Adequate nutrition will be maintained Outcome: Progressing   Problem: Elimination: Goal: Will not experience complications related to urinary retention Outcome: Progressing   Problem: Pain Management: Goal: General experience of comfort will improve Outcome: Progressing

## 2022-11-05 NOTE — Plan of Care (Signed)

## 2022-11-05 NOTE — Progress Notes (Signed)
[facility]  PROGRESS NOTE  Dylan [name] ZOX:[id number] DOB: Nov 04, 1965 DOA: 11/03/2022 PCP: Hoy Register, MD   Brief HPI:   57 year old male with medical history of hypertension, hyperlipidemia, HIV, prediabetes, rhabdomyolysis, hepatitis B, bipolar disorder, anxiety, GERD, polysubstance abuse presented with complaints of gradual lower extremity weakness for past 1 day.  Patient says that he was having soreness of both lower extremities.  Patient developed rhabdomyolysis in 2019 at that time required temporary hemodialysis.  In the ED UDS was positive for cocaine, CK was elevated to 3329.  Patient admitted with rhabdomyolysis.    Assessment/Plan:   Rhabdomyolysis -Presents with bilateral lower extremity weakness -Found to have CK 3329, started on IV fluids -CK has improved to 1492 -Continue LR at 100 mL/h -Follow CK level in a.m.  Transaminitis -Mild; likely in setting of rhabdomyolysis -Improved  Hypertension -Patient is not on any medications at home -Continue IV as needed hydralazine  HIV -HIV-1 RNA quantitative 21 in 08/2022, and last CD4 count was 638 back in 10/2021.  - Patient followed by infectious disease in outpatient setting. -Continue Biktarvy  Peripheral neuropathy Patient reports having significant pain in his feet and legs.  - Possibly secondary to patient's history of tobacco and/or alcohol use. -X-ray of right foot shows no acute abnormality -Continue gabapentin   Bipolar disorder -Continue Seroquel    Chronic hepatitis B Patient  has history of chronic viral hepatitis.   Polysubstance abuse -UDS positive for cocaine.  -Patient also reported history of marijuana, tobacco use(currently vapes), and alcohol use. -Continue to counsel patient on need of cessation of licit drug use  GERD -Continue pharmacy substitution of Protonix for omeprazole  Cough -Unclear etiology -Chest x-ray clear  Medications     aspirin EC  81 mg Oral Daily    bictegravir-emtricitabine-tenofovir AF  1 tablet Oral Daily   gabapentin  600 mg Oral TID   pantoprazole  40 mg Oral Daily   QUEtiapine  150 mg Oral QHS   sodium chloride flush  3 mL Intravenous Q12H     Data Reviewed:   CBG:  No results for input(s): "GLUCAP" in the last 168 hours.  SpO2: 97 %    Vitals:   11/04/22 1623 11/04/22 2019 11/05/22 0434 11/05/22 0805  BP: (!) 143/77 132/80 (!) 135/95 (!) 140/90  Pulse: 77 74 67 62  Resp: 18   17  Temp: 98.6 F (37 C) 98.1 F (36.7 C) 98.4 F (36.9 C) 98 F (36.7 C)  TempSrc:  Oral Oral   SpO2: 100% 97% 97% 97%  Weight:      Height:          Data Reviewed:  Basic Metabolic Panel: Recent Labs  Lab 11/03/22 0204 11/04/22 1127  NA 135 140  K 4.0 3.8  CL 101 108  CO2 22 26  GLUCOSE 91 90  BUN 17 13  CREATININE 1.05 1.13  CALCIUM 9.8 9.1    CBC: Recent Labs  Lab 11/03/22 0204 11/04/22 1127  WBC 9.5 4.9  NEUTROABS 6.2  --   HGB 15.5 13.8  HCT 47.0 41.8  MCV 93.1 91.3  PLT 155 186    LFT Recent Labs  Lab 11/03/22 0204 11/04/22 1127  AST 73* 52*  ALT 48* 38  ALKPHOS 99 83  BILITOT 0.9 0.6  PROT 8.2* 6.5  ALBUMIN 4.6 3.4*     Antibiotics: Anti-infectives (From admission, onward)    Start     Dose/Rate Route Frequency Ordered Stop   11/03/22 1000  bictegravir-emtricitabine-tenofovir AF (BIKTARVY) 50-200-25 MG per tablet 1 tablet        1 tablet Oral Daily 11/03/22 0734          DVT prophylaxis: SCDs  Code Status: Full code  Family Communication: No family at bedside   CONSULTS    Subjective   Patient complains of pain right foot.  X-ray of the foot obtained showed no acute abnormality.  Shows chronic changes after surgery on right foot.  Objective    Physical Examination:  General-appears in no acute distress Heart-S1-S2, regular, no murmur auscultated Lungs-clear to auscultation bilaterally, no wheezing or crackles auscultated Abdomen-soft, nontender, no  organomegaly Extremities-no edema in the lower extremities Neuro-alert, oriented x3, no focal deficit noted   Status is: Inpatient:          Meredeth Ide   [facility] If 7PM-7AM, please contact night-coverage at [email or web address], Office  [phone]   11/05/2022, 9:35 AM  LOS: 0 days

## 2022-11-06 DIAGNOSIS — R531 Weakness: Secondary | ICD-10-CM | POA: Diagnosis not present

## 2022-11-06 DIAGNOSIS — M6282 Rhabdomyolysis: Secondary | ICD-10-CM | POA: Diagnosis not present

## 2022-11-06 DIAGNOSIS — R748 Abnormal levels of other serum enzymes: Secondary | ICD-10-CM | POA: Diagnosis not present

## 2022-11-06 DIAGNOSIS — I10 Essential (primary) hypertension: Secondary | ICD-10-CM | POA: Diagnosis not present

## 2022-11-06 LAB — BASIC METABOLIC PANEL
Anion gap: 10 (ref 5–15)
BUN: 13 mg/dL (ref 6–20)
CO2: 25 mmol/L (ref 22–32)
Calcium: 8.9 mg/dL (ref 8.9–10.3)
Chloride: 103 mmol/L (ref 98–111)
Creatinine, Ser: 1.04 mg/dL (ref 0.61–1.24)
GFR, Estimated: >60 mL/min (ref >60)
Glucose, Bld: 97 mg/dL (ref 70–99)
Potassium: 4 mmol/L (ref 3.5–5.1)
Sodium: 138 mmol/L (ref 135–145)

## 2022-11-06 LAB — CK: Total CK: 822 U/L — ABNORMAL HIGH (ref 49–397)

## 2022-11-06 NOTE — Plan of Care (Signed)
  Problem: Clinical Measurements: Goal: Will remain free from infection Outcome: Progressing Goal: Respiratory complications will improve Outcome: Progressing   Problem: Activity: Goal: Risk for activity intolerance will decrease Outcome: Progressing   Problem: Coping: Goal: Level of anxiety will decrease Outcome: Progressing   Problem: Pain Management: Goal: General experience of comfort will improve Outcome: Progressing

## 2022-11-06 NOTE — Discharge Summary (Signed)
Physician Discharge Summary   Patient: Dylan [name] MRN: [id number] DOB: Jul 19, 1965  Admit date:     11/03/2022  Discharge date: 11/06/22  Discharge Physician: Meredeth Ide   PCP: Hoy Register, MD   Recommendations at discharge:   Follow-up PCP in 1 week Check CK level on Friday, November 1 at PCP office  Discharge Diagnoses: Principal Problem:   Rhabdomyolysis Active Problems:   Weakness   Cough   Elevated liver enzymes   Essential hypertension   HIV (human immunodeficiency virus infection) (HCC)   Peripheral neuropathy   Bipolar disorder (HCC)   Chronic 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 coma (HCC)   Polysubstance abuse (HCC)  Resolved Problems:   * No resolved hospital problems. *  Hospital Course: 57 year old male with medical history of hypertension, hyperlipidemia, HIV, prediabetes, rhabdomyolysis, hepatitis B, bipolar disorder, anxiety, GERD, polysubstance abuse presented with complaints of gradual lower extremity weakness for past 1 day. Patient says that he was having soreness of both lower extremities. Patient developed rhabdomyolysis in 2019 at that time required temporary hemodialysis. In the ED UDS was positive for cocaine, CK was elevated to 3329. Patient admitted with rhabdomyolysis.   Assessment and Plan:  Rhabdomyolysis -Presents with bilateral lower extremity weakness -Found to have CK 3329, started on IV fluids -CK has improved to 822 -Patient can be discharged home, we will drink plenty of fluids. -Need to recheck CK in 5 days at PCP office   Transaminitis -Mild; likely in setting of rhabdomyolysis -Improved   Hypertension -Patient is not on any medications at home -Blood pressure has been well-controlled   HIV -HIV-1 RNA quantitative 21 in 08/2022, and last CD4 count was 638 back in 10/2021.  - Patient followed by infectious disease in outpatient setting. -Continue Biktarvy   Peripheral neuropathy Patient reports having  significant pain in his feet and legs.  - Possibly secondary to patient's history of tobacco and/or alcohol use. -X-ray of right foot shows no acute abnormality -Continue gabapentin   Bipolar disorder -Continue Seroquel    Chronic hepatitis B Patient  has history of chronic viral hepatitis.   Polysubstance abuse -UDS positive for cocaine.  -Patient also reported history of marijuana, tobacco use(currently vapes), and alcohol use. -Continue to counsel patient on need of cessation of licit drug use  GERD -Continue pharmacy substitution of Protonix for omeprazole   Cough -Unclear etiology -Chest x-ray clear       Consultants:  Procedures performed:  Disposition: Home Diet recommendation:  Discharge Diet Orders (From admission, onward)     Start     Ordered   11/06/22 0000  Diet - low sodium heart healthy        11/06/22 1119           Regular diet DISCHARGE MEDICATION: Allergies as of 11/06/2022       Reactions   Penicillins Other (See Comments)   Childhood allergy   Pork-derived Products Other (See Comments)   Patient preference   Latex Rash   Tape Rash        Medication List     TAKE these medications    aspirin EC 81 MG tablet Take 1 tablet (81 mg total) by mouth daily. Swallow whole.   Biktarvy 50-200-25 MG Tabs tablet Generic drug: bictegravir-emtricitabine-tenofovir AF Take 1 tablet by mouth daily.   gabapentin 300 MG capsule Commonly known as: NEURONTIN Take 1 capsule (300 mg total) by mouth 3 (three) times daily.   meloxicam 7.5 MG tablet Commonly  known as: MOBIC Take 1 tablet (7.5 mg total) by mouth daily. What changed:  when to take this reasons to take this   omeprazole 40 MG capsule Commonly known as: PRILOSEC Take 1 capsule (40 mg total) by mouth daily.   QUEtiapine Fumarate 150 MG Tabs Take 150 mg by mouth at bedtime.   sildenafil 100 MG tablet Commonly known as: Viagra Take 1 tablet (100 mg total) by mouth daily as  needed for erectile dysfunction. At least 24 hours between doses.   traZODone 50 MG tablet Commonly known as: DESYREL Take 1 tablet (50 mg total) by mouth at bedtime as needed for sleep.        Follow-up Information     Hoy Register, MD Follow up in 1 week(s).   Specialty: Family Medicine Why: Check CK level on Nov 1st Contact information: [address] [place] [address] [phone]                Discharge Exam: Ceasar Mons Weights   11/03/22 0148  Weight: 104.3 kg   General-appears in no acute distress Heart-S1-S2, regular, no murmur auscultated Lungs-clear to auscultation bilaterally, no wheezing or crackles auscultated Abdomen-soft, nontender, no organomegaly Extremities-no edema in the lower extremities Neuro-alert, oriented x3, no focal deficit noted  Condition at discharge: good  The results of significant diagnostics from this hospitalization (including imaging, microbiology, ancillary and laboratory) are listed below for reference.   Imaging Studies: DG Foot 2 Views Right  Result Date: 11/04/2022 CLINICAL DATA:  Right foot pain EXAM: RIGHT FOOT - 2 VIEW COMPARISON:  11/08/2021 FINDINGS: Frontal and lateral views of the right foot are obtained. Stable postsurgical changes within the first and second digits, with stable position of the cannulated screws traversing the first and second metatarsals and first tarsometatarsal joints. Stable lucency surrounding the screw suggesting motion at the surgical site. Increased bony fusion across the first metatarsophalangeal joint since prior study. There are no acute displaced fractures. Stable osteoarthritis of the tarsometatarsal joints. Soft tissues are unremarkable. IMPRESSION: 1. Stable postsurgical changes at the base of the first and second metatarsals as above, with slight increased bony ankylosis across the first tarsometatarsal joint. Stable lucency surrounding the screws as above, suggesting  motion at the fusion sites. 2. No acute or destructive bony abnormalities. No evidence of fracture or osteomyelitis. 3. Stable osteoarthritis throughout the tarsometatarsal joints. Electronically Signed   By: Sharlet Salina M.D.   On: 11/04/2022 19:32   DG CHEST PORT 1 VIEW  Result Date: 11/03/2022 CLINICAL DATA:  Cough EXAM: PORTABLE CHEST 1 VIEW COMPARISON:  X-ray 06/02/2022 FINDINGS: No consolidation, pneumothorax or effusion. No edema. Normal cardiopericardial silhouette. Overlapping cardiac leads. IMPRESSION: No acute cardiopulmonary disease. Electronically Signed   By: Karen Kays M.D.   On: 11/03/2022 10:02    Microbiology: Results for orders placed or performed during the hospital encounter of 11/03/22  Respiratory (~20 pathogens) panel by PCR     Status: None   Collection Time: 11/03/22  9:07 AM   Specimen: Nasopharyngeal Swab; Respiratory  Result Value Ref Range Status   Adenovirus NOT DETECTED NOT DETECTED Final   Coronavirus 229E NOT DETECTED NOT DETECTED Final    Comment: (NOTE) The Coronavirus on the Respiratory Panel, DOES NOT test for the novel  Coronavirus (2019 nCoV)    Coronavirus HKU1 NOT DETECTED NOT DETECTED Final   Coronavirus NL63 NOT DETECTED NOT DETECTED Final   Coronavirus OC43 NOT DETECTED NOT DETECTED Final   Metapneumovirus NOT DETECTED  NOT DETECTED Final   Rhinovirus / Enterovirus NOT DETECTED NOT DETECTED Final   Influenza A NOT DETECTED NOT DETECTED Final   Influenza B NOT DETECTED NOT DETECTED Final   Parainfluenza Virus 1 NOT DETECTED NOT DETECTED Final   Parainfluenza Virus 2 NOT DETECTED NOT DETECTED Final   Parainfluenza Virus 3 NOT DETECTED NOT DETECTED Final   Parainfluenza Virus 4 NOT DETECTED NOT DETECTED Final   Respiratory Syncytial Virus NOT DETECTED NOT DETECTED Final   Bordetella pertussis NOT DETECTED NOT DETECTED Final   Bordetella Parapertussis NOT DETECTED NOT DETECTED Final   Chlamydophila pneumoniae NOT DETECTED NOT DETECTED Final    Mycoplasma pneumoniae NOT DETECTED NOT DETECTED Final    Comment: Performed at [facility], [address] [place], [place] [address]  SARS Coronavirus 2 by RT PCR (hospital order, performed in [facility] hospital lab) *cepheid single result test* Nasopharyngeal Swab     Status: None   Collection Time: 11/03/22  9:07 AM   Specimen: Nasopharyngeal Swab; Nasal Swab  Result Value Ref Range Status   SARS Coronavirus 2 by RT PCR NEGATIVE NEGATIVE Final    Comment: Performed at [facility], [address] [place], [place] [address]    Labs: CBC: Recent Labs  Lab 11/03/22 0204 11/04/22 1127  WBC 9.5 4.9  NEUTROABS 6.2  --   HGB 15.5 13.8  HCT 47.0 41.8  MCV 93.1 91.3  PLT 155 186   Basic Metabolic Panel: Recent Labs  Lab 11/03/22 0204 11/04/22 1127 11/06/22 0716  NA 135 140 138  K 4.0 3.8 4.0  CL 101 108 103  CO2 22 26 25   GLUCOSE 91 90 97  BUN 17 13 13   CREATININE 1.05 1.13 1.04  CALCIUM 9.8 9.1 8.9   Liver Function Tests: Recent Labs  Lab 11/03/22 0204 11/04/22 1127  AST 73* 52*  ALT 48* 38  ALKPHOS 99 83  BILITOT 0.9 0.6  PROT 8.2* 6.5  ALBUMIN 4.6 3.4*   CBG: No results for input(s): "GLUCAP" in the last 168 hours.  Discharge time spent: greater than 30 minutes.  Signed: Meredeth Ide, MD [facility] 11/06/2022

## 2022-11-06 NOTE — Progress Notes (Signed)
Mobility Specialist Progress Note:    11/06/22 1130  Mobility  Activity Ambulated independently in hallway;Ambulated with assistance in hallway  Level of Assistance Standby assist, set-up cues, supervision of patient - no hands on  Assistive Device None  Distance Ambulated (ft) 150 ft  Activity Response Tolerated well  Mobility Referral Yes  $Mobility charge 1 Mobility  Mobility Specialist Start Time (ACUTE ONLY) 1130  Mobility Specialist Stop Time (ACUTE ONLY) 1137  Mobility Specialist Time Calculation (min) (ACUTE ONLY) 7 min   Pt received sitting EOB, agreeable to mobility session. Ambulated in hallway with SBA, no AD required. Tolerated well, c/o slight "lightheadedness" rated 2/10. Returned pt to room, all needs met.    Feliciana Rossetti Mobility Specialist Please contact via Special educational needs teacher or  Rehab office at [phone]

## 2022-11-07 ENCOUNTER — Telehealth: Payer: Self-pay

## 2022-11-07 DIAGNOSIS — M6282 Rhabdomyolysis: Secondary | ICD-10-CM

## 2022-11-07 NOTE — Telephone Encounter (Signed)
From [facility] call:  The AVS notes to have CK level check on 11/10/2022. His appointment with Dr Alvis Lemmings is not until 11/22/2022.  I offered him the option of being seen at another clinic so he could be seen sooner; but he said he wanted to stay with Dr Alvis Lemmings.

## 2022-11-07 NOTE — Telephone Encounter (Signed)
I have placed future orders for CK which can be done prior to his visit.

## 2022-11-07 NOTE — Transitions of Care (Post Inpatient/ED Visit) (Signed)
11/07/2022  Name: Dylan [name] MRN: [id number] DOB: 11-Apr-1965  Today's TOC FU Call Status: Today's TOC FU Call Status:: Successful TOC FU Call Completed TOC FU Call Complete Date: 11/07/22 Patient's Name and Date of Birth confirmed.  Transition Care Management Follow-up Telephone Call Date of Discharge: 11/06/22 Discharge Facility: [name]) Type of Discharge: Inpatient Admission Primary Inpatient Discharge Diagnosis:: rhabdomyolysis How have you been since you were released from the hospital?: Better Any questions or concerns?: No  Items Reviewed: Did you receive and understand the discharge instructions provided?: Yes Medications obtained,verified, and reconciled?: Partial Review Completed Reason for Partial Mediation Review: he said he has all medications and did not have any questions about his med regime and did not need to review the med list Any new allergies since your discharge?: No Dietary orders reviewed?: Yes Type of Diet Ordered:: heart healthy, low sodium, drink plenty of fluids, including Gatorade. Do you have support at home?: Yes People in Home: alone  Medications Reviewed Today: Medications Reviewed Today   Medications were not reviewed in this encounter     Home Care and Equipment/Supplies: Were Home Health Services Ordered?: No Any new equipment or medical supplies ordered?: No  Functional Questionnaire: Do you need assistance with bathing/showering or dressing?: No Do you need assistance with meal preparation?: No Do you need assistance with eating?: No Do you have difficulty maintaining continence: No Do you need assistance with getting out of bed/getting out of a chair/moving?: No Do you have difficulty managing or taking your medications?: No  Follow up appointments reviewed: PCP Follow-up appointment confirmed?: Yes Date of PCP follow-up appointment?: 11/22/22 Follow-up Provider: Dr Alvis Lemmings - the AVS notes to have CK level check on  11/10/2022. I offered the patient the option of being seen at another clinic to be seen prior to 11/22/2022 but he said he wanted to stay with Dr Alvis Lemmings. Specialist Hospital Follow-up appointment confirmed?: Yes Date of Specialist follow-up appointment?: 11/20/22 Follow-Up Specialty Provider:: cardiology Do you need transportation to your follow-up appointment?: No (He said he uses Medicaid transportation) Do you understand care options if your condition(s) worsen?: Yes-patient verbalized understanding    SIGNATURE Robyne Peers, RN

## 2022-11-08 NOTE — Telephone Encounter (Signed)
I spoke to the patient and informed him that Dr Alvis Lemmings placed the order for the [facility] and he can have that done prior to his scheduled appointment with her on 11/22/2022.   He is requesting that the order be faxed to [facility] in [place] because he lives in [place].  He was not sure which of the 2 [facility] offices in [place] he wants it faxed to , so he will call back with the address and/ or fax number of the [facility] office he prefers.  I told him that if I am not available when he calls back, he can leave the information with the answering service and they will get the information to me.

## 2022-11-16 NOTE — Telephone Encounter (Signed)
I called the patient to let him know that I have not received any information from him about the [facility] in [place]. He said he was out of town and forgot.  I told him that he can still call me with that information and he can have the labs done prior to his appointment with Dr Alvis Lemmings next week.  He said he understood and would get me that information.

## 2022-11-20 ENCOUNTER — Ambulatory Visit: Payer: MEDICAID | Attending: Internal Medicine | Admitting: Internal Medicine

## 2022-11-20 ENCOUNTER — Other Ambulatory Visit: Payer: Self-pay | Admitting: Internal Medicine

## 2022-11-20 ENCOUNTER — Encounter: Payer: Self-pay | Admitting: Internal Medicine

## 2022-11-20 VITALS — BP 130/72 | HR 86 | Ht 74.0 in | Wt 228.8 lb

## 2022-11-20 DIAGNOSIS — E7800 Pure hypercholesterolemia, unspecified: Secondary | ICD-10-CM

## 2022-11-20 DIAGNOSIS — R739 Hyperglycemia, unspecified: Secondary | ICD-10-CM | POA: Diagnosis not present

## 2022-11-20 NOTE — Progress Notes (Signed)
Cardiology Office Note  Date: 11/20/2022   ID: Dylan [name], DOB 03-04-1965, MRN [id number]  PCP:  Hoy Register, MD  Cardiologist:  None Electrophysiologist:  None   History of Present Illness: Dylan [name] is a 57 y.o. male known to have HIV on Biktarvy, polysubstance abuse (vapes cocaine and nicotine), recurrent rhabdomyolysis was referred to cardiology clinic for HLD management.  Patient had multiple ER/hospitalizations for recurrent nontraumatic rhabdomyolysis requiring IV fluids.  He does not perform strenuous activity outside every day.  He does vape cocaine and nicotine on a regular basis.  His most recent urine drug screen from 11/03/2022 was positive for cocaine when he seek levels were elevated to 3000's.  His statin was discontinued in 06/2022, despite which, his CKD will continue to be elevated.  He denies having any symptoms of angina, DOE, orthopnea, PND, dizziness, presyncope and syncope.  No leg swelling.  Has HIV, on Biktarvy, his viral load is undetectable.  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   Intentional drug overdose (HCC) 11/26/2021   Neuromuscular disorder (HCC)    neuropathy   Neuropathy    Pre-diabetes    Rhabdomyolysis 02/19/2017   Unilateral primary osteoarthritis, left knee 12/14/2021    Past Surgical History:  Procedure Laterality Date   FOOT ARTHRODESIS Right 09/17/2019   Procedure: FUSION RIGHT LISFRANC JOINT;  Surgeon: Nadara Mustard, MD;  Location: [facility] OR;  Service: Orthopedics;  Laterality: Right;   FOOT ARTHRODESIS Right 09/2019   HEMORROIDECTOMY     IR FLUORO GUIDE CV LINE RIGHT  02/22/2017   IR US GUIDE VASC ACCESS RIGHT  02/22/2017   TOTAL KNEE ARTHROPLASTY Left  12/14/2021   Procedure: LEFT TOTAL KNEE ARTHROPLASTY;  Surgeon: Nadara Mustard, MD;  Location: [facility] OR;  Service: Orthopedics;  Laterality: Left;   TUMOR REMOVAL     From Chest    Current Outpatient Medications  Medication Sig Dispense Refill   aspirin EC 81 MG tablet Take 1 tablet (81 mg total) by mouth daily. Swallow whole. 30 tablet 12   bictegravir-emtricitabine-tenofovir AF (BIKTARVY) 50-200-25 MG TABS tablet Take 1 tablet by mouth daily. 30 tablet 11   gabapentin (NEURONTIN) 300 MG capsule Take 1 capsule (300 mg total) by mouth 3 (three) times daily. 30 capsule 0   meloxicam (MOBIC) 7.5 MG tablet Take 1 tablet (7.5 mg total) by mouth daily. (Patient taking differently: Take 7.5 mg by mouth daily as needed for pain.) 30 tablet 1   omeprazole (PRILOSEC) 40 MG capsule Take 1 capsule (40 mg total) by mouth daily. 30 capsule 3   QUEtiapine Fumarate 150 MG TABS Take 150 mg by mouth at bedtime. 4 tablet 0   sildenafil (VIAGRA) 100 MG tablet Take 1 tablet (100 mg total) by mouth daily as needed for erectile dysfunction. At least 24 hours between doses. 10 tablet 2   traZODone (DESYREL) 50 MG tablet Take 1 tablet (50 mg total) by mouth at bedtime as needed for sleep. 30 tablet 2   No current facility-administered medications for this visit.   Allergies:  Penicillins, Pork-derived products, Latex, and Tape   Social History: The patient  reports  that he has quit smoking. His smoking use included e-cigarettes and cigarettes. He has a 12.5 pack-year smoking history. He has never used smokeless tobacco. He reports current alcohol use. He reports that he does not currently use drugs after having used the following drugs: Cocaine and Marijuana.   Family History: The patient's family history includes CAD in his mother; Colon cancer (age of onset: 35) in his maternal aunt; Diabetes in his father and maternal aunt; Heart attack in his maternal uncle; Heart disease in his maternal uncle; Stroke in his father.    ROS:  Please see the history of present illness. Otherwise, complete review of systems is positive for none  All other systems are reviewed and negative.   Physical Exam: VS:  BP 130/72   Pulse 86   Ht 6\' 2"  (1.88 m)   Wt 228 lb 12.8 oz (103.8 kg)   SpO2 95%   BMI 29.38 kg/m , BMI Body mass index is 29.38 kg/m.  Wt Readings from Last 3 Encounters:  11/20/22 228 lb 12.8 oz (103.8 kg)  11/03/22 230 lb (104.3 kg)  06/26/22 238 lb 6.4 oz (108.1 kg)    General: Patient appears comfortable at rest. HEENT: Conjunctiva and lids normal, oropharynx clear with moist mucosa. Neck: Supple, no elevated JVP or carotid bruits, no thyromegaly. Lungs: Clear to auscultation, nonlabored breathing at rest. Cardiac: Regular rate and rhythm, no S3 or significant systolic murmur, no pericardial rub. Abdomen: Soft, nontender, no hepatomegaly, bowel sounds present, no guarding or rebound. Extremities: No pitting edema, distal pulses 2+. Skin: Warm and dry. Musculoskeletal: No kyphosis. Neuropsychiatric: Alert and oriented x3, affect grossly appropriate.  Recent Labwork: 08/25/2022: Magnesium 2.5 09/11/2022: TSH 0.837 11/04/2022: ALT 38; AST 52; Hemoglobin 13.8; Platelets 186 11/06/2022: BUN 13; Creatinine, Ser 1.04; Potassium 4.0; Sodium 138     Component Value Date/Time   CHOL 235 (H) 09/11/2022 0009   CHOL 158 02/13/2019 1128   TRIG 52 09/11/2022 0009   HDL 59 09/11/2022 0009   HDL 41 02/13/2019 1128   CHOLHDL 4.0 09/11/2022 0009   VLDL 10 09/11/2022 0009   LDLCALC 166 (H) 09/11/2022 0009   LDLCALC 97 02/13/2019 1128   LDLCALC 149 (H) 06/06/2018 1406      Assessment and Plan:  HLD, not at goal Cocaine induced rhabdomyolysis HIV on Biktarvy Polysubstance abuse (cocaine, nicotine)   -Patient had multiple ER/hospitalizations for nontraumatic rhabdomyolysis with significant elevation of CK.  Statin was discontinued in 6/24 due to  rhabdomyolysis but despite discontinuing statin, his CK  levels continue to be elevated.  His urine drug screen was positive for cocaine as recent as 11/03/2022.  I explained to the patient that cocaine will cause rhabdomyolysis and strongly encouraged him to quit.  He is motivated to quit.  Due to elevation of CK, will not restart statin, instead we will place referral to Pharm.D. for initiation of PCSK9 inhibitors.  Goal LDL will be less than 100.  No known CAD.  Will repeat lipid panel in 3 months after initiation of PCSK9 inhibitors. -His troponins are mildly elevated, 102 in 05/2022 but due to chronic elevation of CK, will defer any noninvasive ischemia evaluation.  Moreover, he denies having any angina, DOE, orthopnea, PND or any other anginal equivalent ever. -He vapes both cocaine and nicotine, counseling provided, strongly encouraged cessation of both cocaine/nicotine, patient motivated to quit both. -Currently on Biktarvy for HIV, his viral load is undetectable.  Follows with PCP.  I spent a total duration of 45  minutes reviewing the prior notes, EKG, labs, face-to-face discussion/counseling of his medical condition, pathophysiology, evaluation, management, placing referrals and documenting the findings in the note.     Medication Adjustments/Labs and Tests Ordered: Current medicines are reviewed at length with the patient today.  Concerns regarding medicines are outlined above.    Disposition:  Follow up  1 year  Signed Fujiko Picazo Verne Spurr, MD, 11/20/2022 4:21 PM    [facility] at [facility] [address] [place], [place], [place] [address]

## 2022-11-20 NOTE — Patient Instructions (Signed)
Medication Instructions:  Your physician recommends that you continue on your current medications as directed. Please refer to the Current Medication list given to you today.   Labwork: Lipid panel in 3 months at [facility] or [facility] 02/2023  Testing/Procedures: None  Follow-Up: Your physician recommends that you schedule a follow-up appointment in: 1 year. You will receive a reminder call in about 8 months reminding you to schedule your appointment. If you don't receive this call, please contact our office.   Any Other Special Instructions Will Be Listed Below (If Applicable). Referral to Pharm D Thank you for choosing [facility]!      If you need a refill on your cardiac medications before your next appointment, please call your pharmacy.

## 2022-11-21 ENCOUNTER — Encounter: Payer: Self-pay | Admitting: Internal Medicine

## 2022-11-22 ENCOUNTER — Inpatient Hospital Stay: Payer: MEDICAID | Admitting: Family Medicine

## 2022-11-22 ENCOUNTER — Other Ambulatory Visit: Payer: Self-pay

## 2022-11-22 ENCOUNTER — Other Ambulatory Visit: Payer: Self-pay | Admitting: Internal Medicine

## 2022-11-22 DIAGNOSIS — B20 Human immunodeficiency virus [HIV] disease: Secondary | ICD-10-CM

## 2022-11-22 DIAGNOSIS — Z79899 Other long term (current) drug therapy: Secondary | ICD-10-CM

## 2022-11-22 DIAGNOSIS — Z113 Encounter for screening for infections with a predominantly sexual mode of transmission: Secondary | ICD-10-CM

## 2022-11-22 MED ORDER — BIKTARVY 50-200-25 MG PO TABS: 1.0000 | ORAL_TABLET | Freq: Every day | ORAL | 0 refills | Status: DC

## 2022-11-22 NOTE — Addendum Note (Signed)
Addended by: Tressa Busman T on: 11/22/2022 10:33 AM   Modules accepted: Orders

## 2022-11-22 NOTE — Addendum Note (Signed)
Addended by: Tressa Busman T on: 11/22/2022 10:55 AM   Modules accepted: Orders

## 2022-11-29 ENCOUNTER — Encounter: Payer: Self-pay | Admitting: Family Medicine

## 2022-11-30 ENCOUNTER — Other Ambulatory Visit: Payer: Self-pay | Admitting: Family Medicine

## 2022-11-30 MED ORDER — TIZANIDINE HCL 4 MG PO TABS: 4.0000 mg | ORAL_TABLET | Freq: Three times a day (TID) | ORAL | 1 refills | Status: DC | PRN

## 2022-12-01 ENCOUNTER — Encounter: Payer: Self-pay | Admitting: Family Medicine

## 2022-12-01 ENCOUNTER — Other Ambulatory Visit: Payer: Self-pay | Admitting: Family Medicine

## 2022-12-01 MED ORDER — GABAPENTIN 300 MG PO CAPS: 300.0000 mg | ORAL_CAPSULE | Freq: Three times a day (TID) | ORAL | 3 refills | Status: DC

## 2022-12-04 ENCOUNTER — Emergency Department (HOSPITAL_COMMUNITY): Payer: MEDICAID

## 2022-12-04 ENCOUNTER — Encounter (HOSPITAL_COMMUNITY): Payer: Self-pay

## 2022-12-04 ENCOUNTER — Observation Stay (HOSPITAL_COMMUNITY)
Admission: EM | Admit: 2022-12-04 | Discharge: 2022-12-05 | Payer: MEDICAID | Attending: Internal Medicine | Admitting: Internal Medicine

## 2022-12-04 ENCOUNTER — Other Ambulatory Visit: Payer: Self-pay

## 2022-12-04 DIAGNOSIS — E785 Hyperlipidemia, unspecified: Secondary | ICD-10-CM | POA: Diagnosis not present

## 2022-12-04 DIAGNOSIS — Z79899 Other long term (current) drug therapy: Secondary | ICD-10-CM | POA: Insufficient documentation

## 2022-12-04 DIAGNOSIS — Z91040 Latex allergy status: Secondary | ICD-10-CM | POA: Insufficient documentation

## 2022-12-04 DIAGNOSIS — Z7689 Persons encountering health services in other specified circumstances: Secondary | ICD-10-CM | POA: Insufficient documentation

## 2022-12-04 DIAGNOSIS — T1491XA Suicide attempt, initial encounter: Principal | ICD-10-CM

## 2022-12-04 DIAGNOSIS — G9009 Other idiopathic peripheral autonomic neuropathy: Secondary | ICD-10-CM | POA: Diagnosis not present

## 2022-12-04 DIAGNOSIS — K700 Alcoholic fatty liver: Secondary | ICD-10-CM | POA: Diagnosis not present

## 2022-12-04 DIAGNOSIS — G629 Polyneuropathy, unspecified: Secondary | ICD-10-CM

## 2022-12-04 DIAGNOSIS — Z21 Asymptomatic human immunodeficiency virus [HIV] infection status: Secondary | ICD-10-CM | POA: Insufficient documentation

## 2022-12-04 DIAGNOSIS — B20 Human immunodeficiency virus [HIV] disease: Secondary | ICD-10-CM | POA: Diagnosis present

## 2022-12-04 DIAGNOSIS — F1910 Other psychoactive substance abuse, uncomplicated: Secondary | ICD-10-CM | POA: Diagnosis not present

## 2022-12-04 DIAGNOSIS — N179 Acute kidney failure, unspecified: Secondary | ICD-10-CM | POA: Diagnosis present

## 2022-12-04 DIAGNOSIS — F3130 Bipolar disorder, current episode depressed, mild or moderate severity, unspecified: Secondary | ICD-10-CM | POA: Diagnosis not present

## 2022-12-04 DIAGNOSIS — K760 Fatty (change of) liver, not elsewhere classified: Secondary | ICD-10-CM | POA: Diagnosis present

## 2022-12-04 DIAGNOSIS — R45851 Suicidal ideations: Secondary | ICD-10-CM

## 2022-12-04 DIAGNOSIS — F3176 Bipolar disorder, in full remission, most recent episode depressed: Secondary | ICD-10-CM | POA: Diagnosis not present

## 2022-12-04 DIAGNOSIS — R7989 Other specified abnormal findings of blood chemistry: Secondary | ICD-10-CM | POA: Diagnosis not present

## 2022-12-04 DIAGNOSIS — F4310 Post-traumatic stress disorder, unspecified: Secondary | ICD-10-CM | POA: Diagnosis not present

## 2022-12-04 DIAGNOSIS — I10 Essential (primary) hypertension: Secondary | ICD-10-CM | POA: Diagnosis not present

## 2022-12-04 DIAGNOSIS — Z87891 Personal history of nicotine dependence: Secondary | ICD-10-CM | POA: Insufficient documentation

## 2022-12-04 DIAGNOSIS — M6282 Rhabdomyolysis: Secondary | ICD-10-CM | POA: Diagnosis not present

## 2022-12-04 DIAGNOSIS — Z96652 Presence of left artificial knee joint: Secondary | ICD-10-CM | POA: Insufficient documentation

## 2022-12-04 DIAGNOSIS — F411 Generalized anxiety disorder: Secondary | ICD-10-CM | POA: Diagnosis present

## 2022-12-04 DIAGNOSIS — G6189 Other inflammatory polyneuropathies: Secondary | ICD-10-CM

## 2022-12-04 LAB — COMPREHENSIVE METABOLIC PANEL
ALT: 177 U/L — ABNORMAL HIGH (ref 0–44)
AST: 615 U/L — ABNORMAL HIGH (ref 15–41)
Albumin: 4.5 g/dL (ref 3.5–5.0)
Alkaline Phosphatase: 86 U/L (ref 38–126)
Anion gap: 14 (ref 5–15)
BUN: 33 mg/dL — ABNORMAL HIGH (ref 6–20)
CO2: 20 mmol/L — ABNORMAL LOW (ref 22–32)
Calcium: 9.7 mg/dL (ref 8.9–10.3)
Chloride: 102 mmol/L (ref 98–111)
Creatinine, Ser: 1.38 mg/dL — ABNORMAL HIGH (ref 0.61–1.24)
GFR, Estimated: 60 mL/min — ABNORMAL LOW (ref >60)
Glucose, Bld: 87 mg/dL (ref 70–99)
Potassium: 4 mmol/L (ref 3.5–5.1)
Sodium: 136 mmol/L (ref 135–145)
Total Bilirubin: 1.1 mg/dL (ref <1.2)
Total Protein: 8.2 g/dL — ABNORMAL HIGH (ref 6.5–8.1)

## 2022-12-04 LAB — SALICYLATE LEVEL: Salicylate Lvl: <7 mg/dL — ABNORMAL LOW (ref 7.0–30.0)

## 2022-12-04 LAB — ACETAMINOPHEN LEVEL: Acetaminophen (Tylenol), Serum: <10 ug/mL — ABNORMAL LOW (ref 10–30)

## 2022-12-04 LAB — CBC
HCT: 45.3 % (ref 39.0–52.0)
Hemoglobin: 14.9 g/dL (ref 13.0–17.0)
MCH: 29.7 pg (ref 26.0–34.0)
MCHC: 32.9 g/dL (ref 30.0–36.0)
MCV: 90.4 fL (ref 80.0–100.0)
Platelets: 215 10*3/uL (ref 150–400)
RBC: 5.01 MIL/uL (ref 4.22–5.81)
RDW: 13.2 % (ref 11.5–15.5)
WBC: 12.1 10*3/uL — ABNORMAL HIGH (ref 4.0–10.5)
nRBC: 0 % (ref 0.0–0.2)

## 2022-12-04 LAB — CK: Total CK: 40294 U/L — ABNORMAL HIGH (ref 49–397)

## 2022-12-04 LAB — ETHANOL: Alcohol, Ethyl (B): <10 mg/dL (ref <10)

## 2022-12-04 MED ORDER — POLYETHYLENE GLYCOL 3350 17 G PO PACK: 17.0000 g | PACK | Freq: Every day | ORAL | Status: DC | PRN

## 2022-12-04 MED ORDER — TRAZODONE HCL 50 MG PO TABS: 50.0000 mg | ORAL_TABLET | Freq: Every evening | ORAL | Status: DC | PRN

## 2022-12-04 MED ORDER — SODIUM CHLORIDE 0.9 % IV BOLUS
500.0000 mL | Freq: Once | INTRAVENOUS | Status: AC
  Administered 2022-12-05: 500 mL via INTRAVENOUS

## 2022-12-04 MED ORDER — RIVAROXABAN 10 MG PO TABS
10.0000 mg | ORAL_TABLET | Freq: Every day | ORAL | Status: DC
  Administered 2022-12-04: 10 mg via ORAL
  Filled 2022-12-04 (×3): qty 1

## 2022-12-04 MED ORDER — QUETIAPINE FUMARATE 25 MG PO TABS
150.0000 mg | ORAL_TABLET | Freq: Every day | ORAL | Status: DC
  Administered 2022-12-04: 150 mg via ORAL
  Filled 2022-12-04: qty 6

## 2022-12-04 MED ORDER — INFLUENZA VAC A&B SURF ANT ADJ 0.5 ML IM SUSY
0.5000 mL | PREFILLED_SYRINGE | INTRAMUSCULAR | Status: DC
  Filled 2022-12-04: qty 0.5

## 2022-12-04 MED ORDER — BICTEGRAVIR-EMTRICITAB-TENOFOV 50-200-25 MG PO TABS
1.0000 | ORAL_TABLET | Freq: Every day | ORAL | Status: DC
  Filled 2022-12-04 (×2): qty 1

## 2022-12-04 MED ORDER — ACETAMINOPHEN 325 MG PO TABS: 650.0000 mg | ORAL_TABLET | Freq: Four times a day (QID) | ORAL | Status: DC | PRN

## 2022-12-04 MED ORDER — TRAMADOL HCL 50 MG PO TABS
50.0000 mg | ORAL_TABLET | Freq: Four times a day (QID) | ORAL | Status: DC | PRN
  Administered 2022-12-04 – 2022-12-05 (×2): 50 mg via ORAL
  Filled 2022-12-04 (×2): qty 1

## 2022-12-04 MED ORDER — ACETAMINOPHEN 650 MG RE SUPP: 650.0000 mg | Freq: Four times a day (QID) | RECTAL | Status: DC | PRN

## 2022-12-04 MED ORDER — GABAPENTIN 300 MG PO CAPS
300.0000 mg | ORAL_CAPSULE | Freq: Three times a day (TID) | ORAL | Status: DC
Start: 2022-12-04 — End: 2022-12-05
  Administered 2022-12-04 – 2022-12-05 (×2): 300 mg via ORAL
  Filled 2022-12-04 (×3): qty 1

## 2022-12-04 MED ORDER — SODIUM CHLORIDE 0.9 % IV BOLUS
1000.0000 mL | Freq: Once | INTRAVENOUS | Status: AC
  Administered 2022-12-04: 1000 mL via INTRAVENOUS

## 2022-12-04 MED ORDER — SODIUM CHLORIDE 0.9% FLUSH
3.0000 mL | Freq: Two times a day (BID) | INTRAVENOUS | Status: DC
  Administered 2022-12-04: 3 mL via INTRAVENOUS

## 2022-12-04 MED ORDER — SODIUM CHLORIDE 0.9 % IV BOLUS
500.0000 mL | Freq: Once | INTRAVENOUS | Status: AC
  Administered 2022-12-04: 500 mL via INTRAVENOUS

## 2022-12-04 MED ORDER — SODIUM CHLORIDE 0.9 % IV SOLN: INTRAVENOUS | Status: AC

## 2022-12-04 NOTE — Consult Note (Signed)
[facility] ED ASSESSMENT   Reason for Consult:  SI Referring Physician:  Theresia Lo Patient Identification: Dylan [name] MRN:  [id number] ED Chief Complaint: Bipolar disorder, most recent episode depressed (HCC)  Diagnosis:  Principal Problem:   Bipolar disorder, most recent episode depressed (HCC) Active Problems:   PTSD (post-traumatic stress disorder)   Suicidal ideation   ED Assessment Time Calculation: Start Time: 1330 Stop Time: 1415 Total Time in Minutes (Assessment Completion): 45   HPI:   Dylan [name] is a 57 y.o. male patient with previous hx of polysubstance abuse and bipolar disorder who presents to [facility] with [facility] after being seen attempting to step off of a ledge in a suicide attempt. GPD was able to talk the patient into coming down. Pt placed under IVC.  Subjective:   Patient seen at [facility] for face to face psychiatric evaluation. Pt does appear tired, he is slightly guarded with assessment. He does admit struggling with his depression recently, his sister whom he was really close with passed away 2 years ago, and he has struggled coping with this grief. He does admit to using some cocaine and THC recently, which likely worsened depressive symptoms. Denies alcohol use. Pt stated he became overwhelmed and "didn't want to be here anymore." Pt states he currently does not know how he feels, but still feels depressed, hopeless, anhedonia, and lack of energy. He does not feel like he would be safe to discharge from hospital at this time. He denies HI. Denies AVH. Pt states "I think its time I get some help."   He is not forth coming with much more information, as he was expressing being very tired and not comfortable opening up at this time. He does not appear to be RTIS, no concerns for psychosis or mania at this time. Able to engage in coherent and logical conversation, although guarded. Thought content appears intact. Pt has been pleasant and cooperative since hospital admission. We spoke  about treatment options and he is agreeable with inpatient psychiatric tx. Spoke with [facility] and they will be able to accept patient for inpatient psychiatric treatment.   Past Psychiatric History:  Bipolar disorder Polysubstance abuse  Risk to Self or Others: Is the patient at risk to self? Yes Has the patient been a risk to self in the past 6 months? No Has the patient been a risk to self within the distant past? Yes Is the patient a risk to others? No Has the patient been a risk to others in the past 6 months? No Has the patient been a risk to others within the distant past? No  [place] Scale:  Flowsheet Row ED from 12/04/2022 in [facility] Emergency Department at [facility] ED to Hosp-Admission (Discharged) from 11/03/2022 in [facility] ED from 09/23/2022 in [facility]  C-SSRS RISK CATEGORY High Risk No Risk High Risk       Substance Abuse:   THC, COC  Past Medical History:  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   Intentional drug overdose (HCC) 11/26/2021   Neuromuscular disorder (HCC)    neuropathy   Neuropathy  Pre-diabetes    Rhabdomyolysis 02/19/2017   Unilateral primary osteoarthritis, left knee 12/14/2021    Past Surgical History:  Procedure Laterality Date   FOOT ARTHRODESIS Right 09/17/2019   Procedure: FUSION RIGHT LISFRANC JOINT;  Surgeon: Nadara Mustard, MD;  Location: [facility] OR;  Service: Orthopedics;  Laterality: Right;   FOOT ARTHRODESIS Right 09/2019   HEMORROIDECTOMY     IR FLUORO GUIDE CV LINE RIGHT  02/22/2017   IR US GUIDE VASC ACCESS RIGHT  02/22/2017   TOTAL KNEE ARTHROPLASTY Left 12/14/2021   Procedure: LEFT TOTAL KNEE  ARTHROPLASTY;  Surgeon: Nadara Mustard, MD;  Location: [facility] OR;  Service: Orthopedics;  Laterality: Left;   TUMOR REMOVAL     From Chest   Family History:  Family History  Problem Relation Age of Onset   CAD Mother    Diabetes Father    Stroke Father    Colon cancer Maternal Aunt 60   Diabetes Maternal Aunt    Heart disease Maternal Uncle    Heart attack Maternal Uncle    Esophageal cancer Neg Hx    Rectal cancer Neg Hx    Stomach cancer Neg Hx    Family Psychiatric  History: unknown Social History:  Social History   Substance and Sexual Activity  Alcohol Use Yes   Comment: last drink March 2024     Social History   Substance and Sexual Activity  Drug Use Not Currently   Types: Cocaine, Marijuana   Comment: Last used cocaine March 2024; last used cannabis 2022    Social History   Socioeconomic History   Marital status: Married    Spouse name: Not on file   Number of children: 1   Years of education: Not on file   Highest education level: Not on file  Occupational History   Not on file  Tobacco Use   Smoking status: Former    Current packs/day: 0.50    Average packs/day: 0.5 packs/day for 25.0 years (12.5 ttl pk-yrs)    Types: E-cigarettes, Cigarettes   Smokeless tobacco: Never   Tobacco comments:    Vapes daily  Vaping Use   Vaping status: Every Day   Start date: 12/09/2020   Substances: Nicotine  Substance and Sexual Activity   Alcohol use: Yes    Comment: last drink March 2024   Drug use: Not Currently    Types: Cocaine, Marijuana    Comment: Last used cocaine March 2024; last used cannabis 2022   Sexual activity: Yes    Partners: Female  Other Topics Concern   Not on file  Social History Narrative   Lives home alone. 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   Social Determinants of Health   Financial Resource Strain: Not on file  Food Insecurity: No Food Insecurity (11/03/2022)   Hunger Vital Sign    Worried About Running Out of Food in the  Last Year: Never true    Ran Out of Food in the Last Year: Never true  Transportation Needs: No Transportation Needs (11/03/2022)   PRAPARE - Administrator, Civil Service (Medical): No    Lack of Transportation (Non-Medical): No  Physical Activity: Not on file  Stress: Not on file  Social Connections: Not on file   Additional Social History:    Allergies:   Allergies  Allergen Reactions   Penicillins Other (See Comments)    Childhood allergy   Pork-Derived Products Other (See Comments)    Patient preference   Latex Rash  Tape Rash    Labs:  Results for orders placed or performed during the hospital encounter of 12/04/22 (from the past 48 hour(s))  Comprehensive metabolic panel     Status: Abnormal   Collection Time: 12/04/22 11:25 AM  Result Value Ref Range   Sodium 136 135 - 145 mmol/L   Potassium 4.0 3.5 - 5.1 mmol/L   Chloride 102 98 - 111 mmol/L   CO2 20 (L) 22 - 32 mmol/L   Glucose, Bld 87 70 - 99 mg/dL    Comment: Glucose reference range applies only to samples taken after fasting for at least 8 hours.   BUN 33 (H) 6 - 20 mg/dL   Creatinine, Ser 5.18 (H) 0.61 - 1.24 mg/dL   Calcium 9.7 8.9 - 84.1 mg/dL   Total Protein 8.2 (H) 6.5 - 8.1 g/dL   Albumin 4.5 3.5 - 5.0 g/dL   AST 660 (H) 15 - 41 U/L   ALT 177 (H) 0 - 44 U/L   Alkaline Phosphatase 86 38 - 126 U/L   Total Bilirubin 1.1 <1.2 mg/dL   GFR, Estimated 60 (L) >60 mL/min    Comment: (NOTE) Calculated using the CKD-EPI Creatinine Equation (2021)    Anion gap 14 5 - 15    Comment: Performed at [facility], [address] [place], [place] [address]  Ethanol     Status: None   Collection Time: 12/04/22 11:25 AM  Result Value Ref Range   Alcohol, Ethyl (B) <10 <10 mg/dL    Comment: (NOTE) Lowest detectable limit for serum alcohol is 10 mg/dL.  For medical purposes only. Performed at [facility], [address] [place], [place] [address]   Salicylate level     Status:  Abnormal   Collection Time: 12/04/22 11:25 AM  Result Value Ref Range   Salicylate Lvl <7.0 (L) 7.0 - 30.0 mg/dL    Comment: Performed at [facility], [address], [place], [place] [address]  Acetaminophen level     Status: Abnormal   Collection Time: 12/04/22 11:25 AM  Result Value Ref Range   Acetaminophen (Tylenol), Serum <10 (L) 10 - 30 ug/mL    Comment: (NOTE) Therapeutic concentrations vary significantly. A range of 10-30 ug/mL  may be an effective concentration for many patients. However, some  are best treated at concentrations outside of this range. Acetaminophen concentrations >150 ug/mL at 4 hours after ingestion  and >50 ug/mL at 12 hours after ingestion are often associated with  toxic reactions.  Performed at [facility], [address] [place], [place] [address]   cbc     Status: Abnormal   Collection Time: 12/04/22 11:25 AM  Result Value Ref Range   WBC 12.1 (H) 4.0 - 10.5 K/uL   RBC 5.01 4.22 - 5.81 MIL/uL   Hemoglobin 14.9 13.0 - 17.0 g/dL   HCT 54.2 70.6 - 23.7 %   MCV 90.4 80.0 - 100.0 fL   MCH 29.7 26.0 - 34.0 pg   MCHC 32.9 30.0 - 36.0 g/dL   RDW 62.8 31.5 - 17.6 %   Platelets 215 150 - 400 K/uL   nRBC 0.0 0.0 - 0.2 %    Comment: Performed at [facility], [address], [place], [place] [address]    No current facility-administered medications for this encounter.   Current Outpatient Medications  Medication Sig Dispense Refill   aspirin EC 81 MG tablet Take 1 tablet (81 mg total)  by mouth daily. Swallow whole. 30 tablet 12   bictegravir-emtricitabine-tenofovir AF (BIKTARVY) 50-200-25 MG TABS tablet Take 1 tablet by mouth daily. 30 tablet 0   gabapentin (NEURONTIN) 300 MG capsule Take 1 capsule (300 mg total) by mouth 3 (three) times daily. 90 capsule 3   meloxicam (MOBIC) 7.5 MG tablet Take 1 tablet (7.5 mg total) by mouth daily. (Patient taking differently: Take 7.5 mg by mouth daily as needed for pain.) 30 tablet 1    omeprazole (PRILOSEC) 40 MG capsule Take 1 capsule (40 mg total) by mouth daily. 30 capsule 3   QUEtiapine Fumarate 150 MG TABS Take 150 mg by mouth at bedtime. 4 tablet 0   sildenafil (VIAGRA) 100 MG tablet Take 1 tablet (100 mg total) by mouth daily as needed for erectile dysfunction. At least 24 hours between doses. 10 tablet 2   tiZANidine (ZANAFLEX) 4 MG tablet Take 1 tablet (4 mg total) by mouth every 8 (eight) hours as needed. 60 tablet 1   traZODone (DESYREL) 50 MG tablet Take 1 tablet (50 mg total) by mouth at bedtime as needed for sleep. 30 tablet 2    Musculoskeletal: Strength & Muscle Tone: within normal limits Gait & Station: normal Patient leans: N/A   Psychiatric Specialty Exam: Presentation  General Appearance:  Appropriate for Environment  Eye Contact: Fair  Speech: Clear and Coherent  Speech Volume: Normal  Handedness: Right   Mood and Affect  Mood: Depressed; Hopeless  Affect: Congruent   Thought Process  Thought Processes: Coherent  Descriptions of Associations:Intact  Orientation:Full (Time, Place and Person)  Thought Content:WDL  History of Schizophrenia/Schizoaffective disorder:No  Duration of Psychotic Symptoms:N/A  Hallucinations:Hallucinations: None  Ideas of Reference:None  Suicidal Thoughts:Suicidal Thoughts: Yes, Active SI Active Intent and/or Plan: Without Intent; With Plan  Homicidal Thoughts:Homicidal Thoughts: No   Sensorium  Memory: Immediate Fair; Recent Fair  Judgment: Fair  Insight: Fair   Executive Functions  Concentration: Good  Attention Span: Good  Recall: Good  Fund of Knowledge: Good  Language: Good   Psychomotor Activity  Psychomotor Activity: Psychomotor Activity: Normal   Assets  Assets: Desire for Improvement; Physical Health; Resilience; Social Support    Sleep  Sleep: Sleep: Good   Physical Exam: Physical Exam Neurological:     Mental Status: He is alert and  oriented to person, place, and time.  Psychiatric:        Attention and Perception: Attention normal.        Mood and Affect: Mood is depressed. Affect is tearful.        Speech: Speech normal.        Behavior: Behavior is cooperative.        Thought Content: Thought content includes suicidal ideation.    Review of Systems  Psychiatric/Behavioral:  Positive for depression, substance abuse and suicidal ideas.   All other systems reviewed and are negative.  Blood pressure 125/81, pulse 99, temperature 98.5 F (36.9 C), resp. rate 17, SpO2 95%. There is no height or weight on file to calculate BMI.  Medical Decision Making: Pt case reviewed and discussed with Dr. Lucianne Muss. Will recommend inpatient psychiatric treatment at this time.   - Continue home medications of Gabapentin 300 mg TID and Seroquel 150 mg Qhs  Disposition:  pt accepted to [facility] for inpatient psychiatric treatment  Eligha Bridegroom, NP 12/04/2022 1:56 PM

## 2022-12-04 NOTE — ED Provider Notes (Signed)
[facility] EMERGENCY DEPARTMENT AT [facility] Provider Note   CSN: [id number] Arrival date & time: 12/04/22  1056     History No chief complaint on file.   Dylan [name] is a 57 y.o. male with medical history of drug overdose, insomnia, bipolar 1 disorder, hepatitis B, HIV, neuropathy.  Patient presents to the ED for evaluation of SI.  Patient arrives under IVC with [employer].  Per IVC paperwork, the patient was found downtown in a parking deck attempting to step off of a ledge of the parking deck.  Patient endorses to me that he was doing this in an attempt to end his life.  Reports that he has lost many family members and last night had a dream about his daughter who is deceased, died at age 29.  He endorses marijuana, crack cocaine use yesterday.  Also endorsing alcohol use.  Denies any alcohol, drug use today.  States he has bipolar disorder, s/p taking Seroquel and trazodone, denies compliance.  Reports history of SI attempt.  Denies medical complaints.  On my examination patient is calm and cooperative, alert and oriented x 4.  HPI     Home Medications Prior to Admission medications   Medication Sig Start Date End Date Taking? Authorizing Provider  aspirin EC 81 MG tablet Take 1 tablet (81 mg total) by mouth daily. Swallow whole. 06/04/22   Dorcas Carrow, MD  bictegravir-emtricitabine-tenofovir AF (BIKTARVY) 50-200-25 MG TABS tablet Take 1 tablet by mouth daily. 11/22/22   Judyann Munson, MD  gabapentin (NEURONTIN) 300 MG capsule Take 1 capsule (300 mg total) by mouth 3 (three) times daily. 12/01/22   Hoy Register, MD  meloxicam (MOBIC) 7.5 MG tablet Take 1 tablet (7.5 mg total) by mouth daily. Patient taking differently: Take 7.5 mg by mouth daily as needed for pain. 09/07/22   Hoy Register, MD  omeprazole (PRILOSEC) 40 MG capsule Take 1 capsule (40 mg total) by mouth daily. 01/04/22   Hoy Register, MD  QUEtiapine Fumarate 150 MG TABS Take 150 mg by mouth at bedtime.  09/23/22 11/03/23  Ardis Hughs, NP  sildenafil (VIAGRA) 100 MG tablet Take 1 tablet (100 mg total) by mouth daily as needed for erectile dysfunction. At least 24 hours between doses. 06/09/22   Hoy Register, MD  tiZANidine (ZANAFLEX) 4 MG tablet Take 1 tablet (4 mg total) by mouth every 8 (eight) hours as needed. 11/30/22   Hoy Register, MD  traZODone (DESYREL) 50 MG tablet Take 1 tablet (50 mg total) by mouth at bedtime as needed for sleep. 05/15/22 11/03/23  Bahraini, Sarah A      Allergies    Penicillins, Pork-derived products, Latex, and Tape    Review of Systems   Review of Systems  Psychiatric/Behavioral:  Positive for self-injury.   All other systems reviewed and are negative.   Physical Exam Updated Vital Signs BP 125/81 (BP Location: Right Arm)   Pulse 99   Temp 98.5 F (36.9 C)   Resp 17   SpO2 95%  Physical Exam Vitals and nursing note reviewed.  Constitutional:      General: He is not in acute distress.    Appearance: He is well-developed.  HENT:     Head: Normocephalic and atraumatic.  Eyes:     Conjunctiva/sclera: Conjunctivae normal.  Cardiovascular:     Rate and Rhythm: Normal rate and regular rhythm.     Heart sounds: No murmur heard. Pulmonary:     Effort: Pulmonary effort is normal. No respiratory  distress.     Breath sounds: Normal breath sounds.  Abdominal:     Palpations: Abdomen is soft.     Tenderness: There is no abdominal tenderness.  Musculoskeletal:        General: No swelling.     Cervical back: Neck supple.  Skin:    General: Skin is warm and dry.     Capillary Refill: Capillary refill takes less than 2 seconds.  Neurological:     Mental Status: He is alert.  Psychiatric:        Mood and Affect: Mood normal.     ED Results / Procedures / Treatments   Labs (all labs ordered are listed, but only abnormal results are displayed) Labs Reviewed  COMPREHENSIVE METABOLIC PANEL - Abnormal; Notable for the following components:       Result Value   CO2 20 (*)    BUN 33 (*)    Creatinine, Ser 1.38 (*)    Total Protein 8.2 (*)    AST 615 (*)    ALT 177 (*)    GFR, Estimated 60 (*)    All other components within normal limits  SALICYLATE LEVEL - Abnormal; Notable for the following components:   Salicylate Lvl <7.0 (*)    All other components within normal limits  ACETAMINOPHEN LEVEL - Abnormal; Notable for the following components:   Acetaminophen (Tylenol), Serum <10 (*)    All other components within normal limits  CBC - Abnormal; Notable for the following components:   WBC 12.1 (*)    All other components within normal limits  ETHANOL  RAPID URINE DRUG SCREEN, HOSP PERFORMED  CK    EKG None  Radiology US Abdomen Limited RUQ (LIVER/GB)  Result Date: 12/04/2022 CLINICAL DATA:  Elevated liver function tests EXAM: ULTRASOUND ABDOMEN LIMITED RIGHT UPPER QUADRANT COMPARISON:  Ultrasound abdomen dated 07/01/2018 FINDINGS: Gallbladder: 7 mm nonshadowing echogenicity in the body of the gallbladder. No wall thickening visualized. No sonographic Murphy sign noted by sonographer. Common bile duct: Diameter: 5 mm Liver: Diffusely increased hepatic echogenicity can be seen in the setting of infiltrative process such as steatosis. Portal vein is patent on color Doppler imaging with normal direction of blood flow towards the liver. Other: None. IMPRESSION: 1. Diffusely increased hepatic echogenicity can be seen in the setting of infiltrative process such as steatosis. 2. A 7 mm nonshadowing echogenicity in the body of the gallbladder may represent a polyp or adherent stone. Recommend follow-up ultrasound in 12 months to assess for stability. Electronically Signed   By: Agustin Cree M.D.   On: 12/04/2022 14:11    Procedures Procedures   Medications Ordered in ED Medications  gabapentin (NEURONTIN) capsule 300 mg (has no administration in time range)  QUEtiapine (SEROQUEL) tablet 150 mg (has no administration in time range)   traZODone (DESYREL) tablet 50 mg (has no administration in time range)  bictegravir-emtricitabine-tenofovir AF (BIKTARVY) 50-200-25 MG per tablet 1 tablet (has no administration in time range)  sodium chloride 0.9 % bolus 500 mL (has no administration in time range)    ED Course/ Medical Decision Making/ A&P Clinical Course as of 12/04/22 1515  Mon Dec 04, 2022  1505 IVC, bipolar disorder, found downtown about to jump off parking deck. Has AKI. Getting CK, fluid, if clear at that point he can go to [facility] at that time.  [CP]    Clinical Course User Index [CP] Olene Floss, PA-C   Medical Decision Making Amount and/or Complexity of Data Reviewed Labs:  ordered. Radiology: ordered.   57 year old man presents after being found downtown attempting to jump off of a parking deck.  Please see HPI for further details.  On exam the patient is afebrile and nontachycardic.  His lung sounds are clear bilaterally, he is not hypoxic.  Abdomen is soft and compressible throughout.  Neurological examination is at baseline.  He denies HI, AVH.  He states that he was attempting to end his life today.  Denies medical complaints.  Endorsing drug use yesterday as well as alcohol use but denies any today.  Labs collected.  CBC with leukocytosis of 12.1, could be secondary to stress response as he is afebrile and nontachycardic.  Acetaminophen level, ethanol, salicylate undetectable.  CMP shows an elevated AST to 615, ALT to 177.  The patient does endorse recently drinking however will obtain upper quadrant ultrasound to assess for cause.  Total bilirubin WNL at 1.1.  Patient creatinine is slightly bumped 1.38.  Will add on CK to assess for rhabdomyolysis as he has a history of a atraumatic rhabdomyolysis.  Rapid urine drug screen pending at this time.  Will provide 500 mL of fluid.  First examination filled out.  Patient here under IVC.  Patient will require TTS to determine disposition once medically  cleared.  Right upper quadrant ultrasound unremarkable.  Will need follow-up ultrasound in 12 months.  He has no right upper quadrant tenderness on exam.  Patient is an excepted to [facility].  Will have EKG collected, UDS.  At time of shift change, patient workup not complete.  Signed out to oncoming provider prosperity PA-C.  Plan of management discussed.   Final Clinical Impression(s) / ED Diagnoses Final diagnoses:  Suicide attempt [facility])  Encounter for psychiatric assessment    Rx / DC Orders ED Discharge Orders     None         Al Decant, PA-C 12/04/22 1516    Elayne Snare K, DO 12/04/22 1603

## 2022-12-04 NOTE — ED Notes (Signed)
Pt belongings placed in locker 5; pt changed into purple scrubs

## 2022-12-04 NOTE — Progress Notes (Signed)
Pt has been accepted to [facility] on Today 12/04/22 Bed assignment: 400-2   Pt meets inpatient criteria per: Eligha Bridegroom NP  Attending Physician will be: Dr. Phineas Inches, MD   Report can be called to: Adult unit: [phone]  Pt can arrive after DC from ED   Care Team Notified: [facility] Rona Ravens RN, Eligha Bridegroom, Malva Limes RN, Devinny Harrington NT    [place] Maddisen Vought LCSW-A   12/04/2022 2:03 PM

## 2022-12-04 NOTE — ED Notes (Signed)
Lunch tray has arrived and pt is eating it.

## 2022-12-04 NOTE — ED Notes (Signed)
PATIENT CAME IN IVC DOCUMENTS UPLOADED TO CHART AS WELL AS FIRST EXAM IT HAS BEEN EFILED ENVELOPE #[id number] 3 COPIES PLACED ON CLIPBOARD IN BLUE ZONE ORIGINAL IN RED FOLDER AND 1 COPY IN MEDICAL RECORDS. EXP 12/10/22

## 2022-12-04 NOTE — H&P (Signed)
History and Physical   Dylan [name] ZOX:[id number] DOB: 10/17/1965 DOA: 12/04/2022  PCP: Hoy Register, MD   Patient coming from: Street, brought in as IVC by [employer].  Chief Complaint: Suicidal ideation/attempt  HPI: Dylan [name] is a 57 y.o. male with medical history significant of hypertension, hyperlipidemia, neuropathy, anxiety, PTSD, bipolar disorder, suicidal ideation, HIV, substance use presenting today after suicidal ideation and near attempt.  As above patient arrived escorted by [employer] under IVC.  He was found downtown on the top floor of the parking deck on the edge with intention to jump.  Does report this was to be a suicide attempt.  Does have psychiatric history as above and reports losing multiple family members.  States he had a dream about his daughter who passed away at 60 years old last night.  Reports using marijuana and crack cocaine yesterday as well as some alcohol.  He does have history of prior suicidal ideation.  In addition is reporting some leg aching similar to previous nontraumatic rhabdomyolysis as well as working in a hot environment and working out more.  He denies fevers, chills, chest pain, shortness of breath, abdominal pain, constipation, diarrhea, nausea, vomiting.  ED Course: Vital signs in the ED stable.  Lab workup included CMP with bicarb 20, BUN 30, creatinine elevated to 1.38 from baseline 1.1, protein 8.2, AST 16, ALT 177.  CBC with leukocytosis 12.1.  UDS pending.  Ethanol level negative.  ASA negative.  APAP negative.  CK elevated to 40,294.  ARB quadrant ultrasound showed increased liver echogenicity which could be consistent with steatosis or other infiltrative etiology.  Also noted with 7 mm nonshadowing echogenicity if it worsens stone or polyp or other.  Patient received 1.5 L IV fluid in the ED.  Psychiatry consulted for placement in inpatient psychiatry hospital and patient was accepted pending medical clearance, unfortunately due to elevated CK he  could not be medically cleared and will be admitted with psychiatry to consult here.  Review of Systems: As per HPI otherwise all other systems reviewed and are negative.  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   Intentional drug overdose (HCC) 11/26/2021   Neuromuscular disorder (HCC)    neuropathy   Neuropathy    Pre-diabetes    Rhabdomyolysis 02/19/2017   Unilateral primary osteoarthritis, left knee 12/14/2021    Past Surgical History:  Procedure Laterality Date   FOOT ARTHRODESIS Right 09/17/2019   Procedure: FUSION RIGHT LISFRANC JOINT;  Surgeon: Dylan Mustard, MD;  Location: [facility] OR;  Service: Orthopedics;  Laterality: Right;   FOOT ARTHRODESIS Right 09/2019   HEMORROIDECTOMY     IR FLUORO GUIDE CV LINE RIGHT  02/22/2017   IR US GUIDE VASC ACCESS RIGHT  02/22/2017   TOTAL KNEE ARTHROPLASTY Left 12/14/2021   Procedure: LEFT TOTAL KNEE ARTHROPLASTY;  Surgeon: Dylan Mustard, MD;  Location: [facility] OR;  Service: Orthopedics;  Laterality: Left;   TUMOR REMOVAL     From Chest    Social History  reports that he has quit smoking. His smoking use included e-cigarettes and cigarettes. He has a 12.5 pack-year smoking history. He has never used smokeless tobacco. He  reports current alcohol use. He reports that he does not currently use drugs after having used the following drugs: Cocaine and Marijuana.  Allergies  Allergen Reactions   Penicillins Other (See Comments)    Reaction type/severity unknown, childhood allergy   Pork-Derived Products Other (See Comments)    Patient preference   Latex Rash   Tape Rash    Family History  Problem Relation Age of Onset   CAD Mother    Diabetes Father    Stroke Father     Colon cancer Maternal Aunt 60   Diabetes Maternal Aunt    Heart disease Maternal Uncle    Heart attack Maternal Uncle    Esophageal cancer Neg Hx    Rectal cancer Neg Hx    Stomach cancer Neg Hx   Reviewed on admission  Prior to Admission medications   Medication Sig Start Date End Date Taking? Authorizing Provider  aspirin EC 81 MG tablet Take 1 tablet (81 mg total) by mouth daily. Swallow whole. 06/04/22  Yes Dylan Carrow, MD  bictegravir-emtricitabine-tenofovir AF (BIKTARVY) 50-200-25 MG TABS tablet Take 1 tablet by mouth daily. 11/22/22  Yes Dylan Munson, MD  gabapentin (NEURONTIN) 300 MG capsule Take 1 capsule (300 mg total) by mouth 3 (three) times daily. 12/01/22  Yes Hoy Register, MD  meloxicam (MOBIC) 7.5 MG tablet Take 1 tablet (7.5 mg total) by mouth daily. Patient taking differently: Take 7.5 mg by mouth daily as needed for pain. 09/07/22  Yes Hoy Register, MD  omeprazole (PRILOSEC) 40 MG capsule Take 1 capsule (40 mg total) by mouth daily. 01/04/22  Yes Hoy Register, MD  tiZANidine (ZANAFLEX) 4 MG tablet Take 1 tablet (4 mg total) by mouth every 8 (eight) hours as needed. 11/30/22  Yes Hoy Register, MD    Physical Exam: Vitals:   12/04/22 1100 12/04/22 1516  BP: 125/81 103/63  Pulse: 99 85  Resp: 17 15  Temp: 98.5 F (36.9 C) 98.1 F (36.7 C)  TempSrc:  Oral  SpO2: 95% 99%    Physical Exam Constitutional:      General: He is not in acute distress.    Appearance: Normal appearance.  HENT:     Head: Normocephalic and atraumatic.     Mouth/Throat:     Mouth: Mucous membranes are moist.     Pharynx: Oropharynx is clear.  Eyes:     Extraocular Movements: Extraocular movements intact.     Pupils: Pupils are equal, round, and reactive to light.  Cardiovascular:     Rate and Rhythm: Normal rate and regular rhythm.     Pulses: Normal pulses.     Heart sounds: Normal heart sounds.  Pulmonary:     Effort: Pulmonary effort is normal. No respiratory  distress.     Breath sounds: Normal breath sounds.  Abdominal:     General: Bowel sounds are normal. There is no distension.     Palpations: Abdomen is soft.     Tenderness: There is no abdominal tenderness.  Musculoskeletal:        General: No swelling or deformity.  Skin:    General: Skin is warm and dry.  Neurological:     General: No focal deficit present.     Mental Status: Mental status is at baseline.    Labs on Admission: I have personally reviewed following labs and imaging studies  CBC: Recent Labs  Lab 12/04/22 1125  WBC 12.1*  HGB 14.9  HCT 45.3  MCV 90.4  PLT 215  Basic Metabolic Panel: Recent Labs  Lab 12/04/22 1125  NA 136  K 4.0  CL 102  CO2 20*  GLUCOSE 87  BUN 33*  CREATININE 1.38*  CALCIUM 9.7    GFR: CrCl cannot be calculated (Unknown ideal weight.).  Liver Function Tests: Recent Labs  Lab 12/04/22 1125  AST 615*  ALT 177*  ALKPHOS 86  BILITOT 1.1  PROT 8.2*  ALBUMIN 4.5    Urine analysis:    Component Value Date/Time   COLORURINE YELLOW 11/03/2022 0523   APPEARANCEUR CLEAR 11/03/2022 0523   LABSPEC 1.014 11/03/2022 0523   PHURINE 6.0 11/03/2022 0523   GLUCOSEU NEGATIVE 11/03/2022 0523   HGBUR NEGATIVE 11/03/2022 0523   BILIRUBINUR NEGATIVE 11/03/2022 0523   KETONESUR 20 (A) 11/03/2022 0523   PROTEINUR NEGATIVE 11/03/2022 0523   UROBILINOGEN 0.2 03/06/2020 1149   NITRITE NEGATIVE 11/03/2022 0523   LEUKOCYTESUR NEGATIVE 11/03/2022 0523    Radiological Exams on Admission: US Abdomen Limited RUQ (LIVER/GB)  Result Date: 12/04/2022 CLINICAL DATA:  Elevated liver function tests EXAM: ULTRASOUND ABDOMEN LIMITED RIGHT UPPER QUADRANT COMPARISON:  Ultrasound abdomen dated 07/01/2018 FINDINGS: Gallbladder: 7 mm nonshadowing echogenicity in the body of the gallbladder. No wall thickening visualized. No sonographic Murphy sign noted by sonographer. Common bile duct: Diameter: 5 mm Liver: Diffusely increased hepatic echogenicity  can be seen in the setting of infiltrative process such as steatosis. Portal vein is patent on color Doppler imaging with normal direction of blood flow towards the liver. Other: None. IMPRESSION: 1. Diffusely increased hepatic echogenicity can be seen in the setting of infiltrative process such as steatosis. 2. A 7 mm nonshadowing echogenicity in the body of the gallbladder may represent a polyp or adherent stone. Recommend follow-up ultrasound in 12 months to assess for stability. Electronically Signed   By: Agustin Cree M.D.   On: 12/04/2022 14:11    EKG: Independently reviewed.  Normal sinus rhythm at 85 bpm.  Nonspecific T wave changes.  Assessment/Plan Principal Problem:   Non-traumatic rhabdomyolysis Active Problems:   Suicidal ideations   Essential hypertension   HIV (human immunodeficiency virus infection) (HCC)   Dyslipidemia   Peripheral neuropathy   Polysubstance abuse (HCC)   Elevated LFTs   Acute renal failure (ARF) (HCC)   Generalized anxiety disorder   Fatty liver   PTSD (post-traumatic stress disorder)   Bipolar disorder, most recent episode depressed (HCC)   Rhabdomyolysis > History of prior nontraumatic rhabdomyolysis in the setting of crack cocaine use. > CK checked in the process of clearing patient for transfer to behavioral health hospital.  Came back elevated to greater than 40,000. > Did use crack cocaine yesterday.  Also reports working in a hot environment and working out more than usual. > Received 1.5 L in the ED. - Monitor on telemetry overnight - Additional 500cc bolus - 200 cc an hour IV normal saline - Trend CK - Urinalysis - Tramadol PRN for pain (IVF should help the most)  Bipolar disorder PTSD Anxiety Suicidal ideation and near attempt > Patient presented under IVC with [employer] after being found at the top of the parking deck downtown standing on the ledge with plans to jump. > As per HPI does report this was to be a suicide attempt as he has chronic  psychiatric illnesses and has lost a lot of family members including his daughter who died of 62 years old.  Had a dream about his daughter last night.   > Also use crack cocaine and marijuana last  night. > Evaluated by TTS in ED with plan for inpatient psychiatric admission behavioral health hospital however due to elevated CK as above unable to be medically cleared. - Consult psychiatry here - Continue with gabapentin, Seroquel, trazodone as recommended - Safety sitter/one-to-one observation - Is under IVC - Plan for discharge to behavioral health hospital when cleared medically  AKI > Creatinine elevated to 1.38 from baseline of 1-1.1. > Likely related to rhabdomyolysis as above - Received IV fluids above - Trend renal function and electrolytes  Elevated LFTs > Has some chronic elevation of LFTs in the 40s to 70s. > Currently AST 16 ALT 177.  Likely secondary to AKI/rhabdomyolysis.  On top of steatosis. - Trend LFTs  HIV - Continue Biktarvy  Neuropathy - Continue home gabapentin  Hypertension Hyperlipidemia - Not on any medication for these   DVT prophylaxis: Xarelto, avoids pork products Code Status:   Full Family Communication:  None on admission. He want to check in with someone but states he needs his phone to do so. Phone was checked in with security as he arrived via [employer].  Disposition Plan:   Patient is from:  Home  Anticipated DC to:  [facility] versus home  Anticipated DC date:  1 to 3 days  Anticipated DC barriers: [facility] placement  Consults called:  Psychiatry Admission status:  Observation, telemetry  Severity of Illness: The appropriate patient status for this patient is OBSERVATION. Observation status is judged to be reasonable and necessary in order to provide the required intensity of service to ensure the patient's safety. The patient's presenting symptoms, physical exam findings, and initial radiographic and laboratory  data in the context of their medical condition is felt to place them at decreased risk for further clinical deterioration. Furthermore, it is anticipated that the patient will be medically stable for discharge from the hospital within 2 midnights of admission.    Synetta Fail MD [facility]  How to contact the [facility] Attending or Consulting provider 7A - 7P or covering provider during after hours 7P -7A, for this patient?   Check the care team in [facility] and look for a) attending/consulting TR[facility] provider listed and b) the [facility] team listed Log into [email or web address] and use [facility]'s universal password to access. If you do not have the password, please contact the hospital operator. Locate the [facility] provider you are looking for under [facility] and page to a number that you can be directly reached. If you still have difficulty reaching the provider, please page the [facility] (Director on Call) for the Hospitalists listed on amion for assistance.  12/04/2022, 5:17 PM

## 2022-12-04 NOTE — ED Triage Notes (Signed)
Pt bib [employer] from parking [place] [address]; bystander witnessed pt attempting to step off of ledge attempting to commit suicide;  [employer] talked py down, pt admitted to attempting suicide; pt states his sister died 2 years ago, has been having a tough time; IVC paperwork in process by counselor for behavioral health response team; pt calm and cooperative at this time

## 2022-12-04 NOTE — ED Notes (Addendum)
Sitter at bedside w/ pt

## 2022-12-04 NOTE — ED Notes (Signed)
Meal Tray ordered.

## 2022-12-04 NOTE — ED Notes (Signed)
Patient transported to Ultrasound 

## 2022-12-04 NOTE — ED Notes (Signed)
ED TO INPATIENT HANDOFF REPORT  ED Nurse Name and Phone #: Cammy Copa  S Name/Age/Gender Dylan [name] 57 y.o. male Room/Bed: 038C/038C  Code Status   Code Status: Full Code  Home/SNF/Other Home Patient oriented to: self, place, time, and situation Is this baseline? Yes   Triage Complete: Triage complete  Chief Complaint Non-traumatic rhabdomyolysis [M62.82]  Triage Note Pt bib [employer] from parking [place] [address]; bystander witnessed pt attempting to step off of ledge attempting to commit suicide;  [employer] talked py down, pt admitted to attempting suicide; pt states his sister died 2 years ago, has been having a tough time; IVC paperwork in process by counselor for behavioral health response team; pt calm and cooperative at this time   Allergies Allergies  Allergen Reactions   Penicillins Other (See Comments)    Reaction type/severity unknown, childhood allergy   Pork-Derived Products Other (See Comments)    Patient preference   Latex Rash   Tape Rash    Level of Care/Admitting Diagnosis ED Disposition     ED Disposition  Admit   Condition  --   Comment  Hospital Area: [facility] [[id number]]  Level of Care: Telemetry Medical [104]  May place patient in observation at [facility] or [facility] if equivalent level of care is available:: No  Covid Evaluation: Asymptomatic - no recent exposure (last 10 days) testing not required  Diagnosis: Non-traumatic rhabdomyolysis [1610960]  Admitting Physician: Synetta Fail [[id number]]  Attending Physician: Synetta Fail [[id number]]          B Medical/Surgery History 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   Intentional drug  overdose (HCC) 11/26/2021   Neuromuscular disorder (HCC)    neuropathy   Neuropathy    Pre-diabetes    Rhabdomyolysis 02/19/2017   Unilateral primary osteoarthritis, left knee 12/14/2021   Past Surgical History:  Procedure Laterality Date   FOOT ARTHRODESIS Right 09/17/2019   Procedure: FUSION RIGHT LISFRANC JOINT;  Surgeon: Nadara Mustard, MD;  Location: [facility] OR;  Service: Orthopedics;  Laterality: Right;   FOOT ARTHRODESIS Right 09/2019   HEMORROIDECTOMY     IR FLUORO GUIDE CV LINE RIGHT  02/22/2017   IR US GUIDE VASC ACCESS RIGHT  02/22/2017   TOTAL KNEE ARTHROPLASTY Left 12/14/2021   Procedure: LEFT TOTAL KNEE ARTHROPLASTY;  Surgeon: Nadara Mustard, MD;  Location: [facility] OR;  Service: Orthopedics;  Laterality: Left;   TUMOR REMOVAL     From Chest     A IV Location/Drains/Wounds Patient Lines/Drains/Airways Status     Active Line/Drains/Airways     Name Placement date Placement time Site Days   Peripheral IV 12/04/22 20 G Anterior;Left Forearm 12/04/22  1505  Forearm  less than 1            Intake/Output Last 24 hours No intake or output data in the 24 hours ending 12/04/22 1715  Labs/Imaging Results for orders placed or performed during the hospital encounter of 12/04/22 (from the past 48 hour(s))  Comprehensive metabolic panel     Status: Abnormal   Collection Time: 12/04/22 11:25 AM  Result Value Ref Range  Sodium 136 135 - 145 mmol/L   Potassium 4.0 3.5 - 5.1 mmol/L   Chloride 102 98 - 111 mmol/L   CO2 20 (L) 22 - 32 mmol/L   Glucose, Bld 87 70 - 99 mg/dL    Comment: Glucose reference range applies only to samples taken after fasting for at least 8 hours.   BUN 33 (H) 6 - 20 mg/dL   Creatinine, Ser 9.60 (H) 0.61 - 1.24 mg/dL   Calcium 9.7 8.9 - 45.4 mg/dL   Total Protein 8.2 (H) 6.5 - 8.1 g/dL   Albumin 4.5 3.5 - 5.0 g/dL   AST 098 (H) 15 - 41 U/L   ALT 177 (H) 0 - 44 U/L   Alkaline Phosphatase 86 38 - 126 U/L   Total Bilirubin 1.1 <1.2 mg/dL   GFR, Estimated  60 (L) >60 mL/min    Comment: (NOTE) Calculated using the CKD-EPI Creatinine Equation (2021)    Anion gap 14 5 - 15    Comment: Performed at [facility], [address], [place], [place] [address]  Ethanol     Status: None   Collection Time: 12/04/22 11:25 AM  Result Value Ref Range   Alcohol, Ethyl (B) <10 <10 mg/dL    Comment: (NOTE) Lowest detectable limit for serum alcohol is 10 mg/dL.  For medical purposes only. Performed at [facility], [address], [place], [place] [address]   Salicylate level     Status: Abnormal   Collection Time: 12/04/22 11:25 AM  Result Value Ref Range   Salicylate Lvl <7.0 (L) 7.0 - 30.0 mg/dL    Comment: Performed at [facility], [address], [place], [place] [address]  Acetaminophen level     Status: Abnormal   Collection Time: 12/04/22 11:25 AM  Result Value Ref Range   Acetaminophen (Tylenol), Serum <10 (L) 10 - 30 ug/mL    Comment: (NOTE) Therapeutic concentrations vary significantly. A range of 10-30 ug/mL  may be an effective concentration for many patients. However, some  are best treated at concentrations outside of this range. Acetaminophen concentrations >150 ug/mL at 4 hours after ingestion  and >50 ug/mL at 12 hours after ingestion are often associated with  toxic reactions.  Performed at [facility], [address], [place], [place] [address]   cbc     Status: Abnormal   Collection Time: 12/04/22 11:25 AM  Result Value Ref Range   WBC 12.1 (H) 4.0 - 10.5 K/uL   RBC 5.01 4.22 - 5.81 MIL/uL   Hemoglobin 14.9 13.0 - 17.0 g/dL   HCT 46.9 62.9 - 52.8 %   MCV 90.4 80.0 - 100.0 fL   MCH 29.7 26.0 - 34.0 pg   MCHC 32.9 30.0 - 36.0 g/dL   RDW 41.3 24.4 - 01.0 %   Platelets 215 150 - 400 K/uL   nRBC 0.0 0.0 - 0.2 %    Comment: Performed at [facility], [address] [place], [place] [address]  CK     Status: Abnormal   Collection Time: 12/04/22 11:25 AM  Result Value Ref Range   Total CK  40,294 (H) 49 - 397 U/L    Comment: RESULT CONFIRMED BY MANUAL DILUTION Performed at [facility], [address] [place], [place] [address]    US Abdomen Limited RUQ (LIVER/GB)  Result Date: 12/04/2022 CLINICAL DATA:  Elevated liver function tests EXAM: ULTRASOUND ABDOMEN LIMITED RIGHT UPPER QUADRANT COMPARISON:  Ultrasound abdomen dated 07/01/2018 FINDINGS: Gallbladder: 7 mm nonshadowing echogenicity in the body of the gallbladder. No wall thickening visualized. No sonographic Murphy sign noted by sonographer. Common bile duct: Diameter: 5 mm Liver: Diffusely increased hepatic echogenicity can be seen in the setting of infiltrative process such as steatosis. Portal vein is patent on color Doppler imaging with normal direction of blood flow towards the liver. Other: None. IMPRESSION: 1. Diffusely increased hepatic echogenicity can be seen in the setting of infiltrative process such as steatosis. 2. A 7 mm nonshadowing echogenicity in the body of the gallbladder may represent a polyp or adherent stone. Recommend follow-up ultrasound in 12 months to assess for stability. Electronically Signed   By: Agustin Cree M.D.   On: 12/04/2022 14:11    Pending Labs Unresulted Labs (From admission, onward)     Start     Ordered   12/05/22 0500  Comprehensive metabolic panel  Tomorrow morning,   R        12/04/22 1703   12/05/22 0500  CBC  Tomorrow morning,   R        12/04/22 1703   12/05/22 0500  CK  Tomorrow morning,   R        12/04/22 1703   12/04/22 1714  Urinalysis, Routine w reflex microscopic -Urine, Clean Catch  Once,   R       Question:  Specimen Source  Answer:  Urine, Clean Catch   12/04/22 1713   12/04/22 1114  Rapid urine drug screen (hospital performed)  Once,   STAT        12/04/22 1113            Vitals/Pain Today's Vitals   12/04/22 1100 12/04/22 1107 12/04/22 1516  BP: 125/81  103/63  Pulse: 99  85  Resp: 17  15  Temp: 98.5 F (36.9 C)  98.1 F (36.7 C)  TempSrc:    Oral  SpO2: 95%  99%  PainSc:  0-No pain     Isolation Precautions No active isolations  Medications Medications  gabapentin (NEURONTIN) capsule 300 mg (has no administration in time range)  QUEtiapine (SEROQUEL) tablet 150 mg (has no administration in time range)  traZODone (DESYREL) tablet 50 mg (has no administration in time range)  bictegravir-emtricitabine-tenofovir AF (BIKTARVY) 50-200-25 MG per tablet 1 tablet (has no administration in time range)  sodium chloride 0.9 % bolus 1,000 mL (has no administration in time range)  rivaroxaban (XARELTO) tablet 10 mg (has no administration in time range)  sodium chloride flush (NS) 0.9 % injection 3 mL (has no administration in time range)  0.9 %  sodium chloride infusion (has no administration in time range)  acetaminophen (TYLENOL) tablet 650 mg (has no administration in time range)    Or  acetaminophen (TYLENOL) suppository 650 mg (has no administration in time range)  polyethylene glycol (MIRALAX / GLYCOLAX) packet 17 g (has no administration in time range)  sodium chloride 0.9 % bolus 500 mL (500 mLs Intravenous New Bag/Given 12/04/22 1505)    Mobility walks     Focused Assessments Cardiac Assessment Handoff:    Lab Results  Component Value Date   CKTOTAL 40,294 (H) 12/04/2022   No results found for: "DDIMER" Does the Patient currently have chest pain? No    R Recommendations: See Admitting Provider Note  Report given to:   Additional Notes: SI pt. Elevated CK levels

## 2022-12-04 NOTE — ED Provider Notes (Signed)
Accepted handoff at shift change from [facility]. Please see prior provider note for more detail.   Briefly: Patient is 57 y.o.   DDX: concern for IVC, bipolar disorder, found downtown about to jump off parking deck. Has AKI. Getting CK, fluid, if clear at that point he can go to [facility] at that time.   Plan: Elevated AST, ALT, he endorses significant alcohol use, creatinine is elevated at 1.38, BUN 33, pending CK, fluid bolus, if CK is not unreasonably elevated he is stable for [facility] evaluation.  Patient with CK greater than 40,000, suspect likely secondary to cocaine use as it has been in the past, will plan for hospital admission, he will need [facility] consultation during/after his hospital visit due to his active suicidal ideation.  Spoke with the hospitalist, Dr. Alinda Money who agrees to admission for rhabdomyolysis, AKI.   Olene Floss, PA-C 12/04/22 1705    Rozelle Logan, DO 12/04/22 2310

## 2022-12-04 NOTE — ED Notes (Signed)
Patient has been moved to 5n15c nurse is coming to get paperwork for ivc

## 2022-12-04 NOTE — ED Notes (Signed)
Valuables given to security

## 2022-12-05 ENCOUNTER — Encounter (HOSPITAL_COMMUNITY): Payer: Self-pay | Admitting: Nurse Practitioner

## 2022-12-05 ENCOUNTER — Inpatient Hospital Stay (HOSPITAL_COMMUNITY)
Admission: AD | Admit: 2022-12-05 | Discharge: 2022-12-10 | DRG: 885 | Disposition: A | Discharge status: Home / Self Care | Payer: MEDICAID | Source: Intra-hospital | Attending: Psychiatry | Admitting: Psychiatry

## 2022-12-05 ENCOUNTER — Other Ambulatory Visit: Payer: Self-pay

## 2022-12-05 DIAGNOSIS — Z8249 Family history of ischemic heart disease and other diseases of the circulatory system: Secondary | ICD-10-CM

## 2022-12-05 DIAGNOSIS — F17290 Nicotine dependence, other tobacco product, uncomplicated: Secondary | ICD-10-CM | POA: Diagnosis present

## 2022-12-05 DIAGNOSIS — Z96652 Presence of left artificial knee joint: Secondary | ICD-10-CM | POA: Diagnosis present

## 2022-12-05 DIAGNOSIS — Z5982 Transportation insecurity: Secondary | ICD-10-CM | POA: Diagnosis not present

## 2022-12-05 DIAGNOSIS — Z91014 Allergy to mammalian meats: Secondary | ICD-10-CM | POA: Diagnosis not present

## 2022-12-05 DIAGNOSIS — F1010 Alcohol abuse, uncomplicated: Secondary | ICD-10-CM | POA: Diagnosis present

## 2022-12-05 DIAGNOSIS — Z880 Allergy status to penicillin: Secondary | ICD-10-CM | POA: Diagnosis not present

## 2022-12-05 DIAGNOSIS — Z833 Family history of diabetes mellitus: Secondary | ICD-10-CM | POA: Diagnosis not present

## 2022-12-05 DIAGNOSIS — D72828 Other elevated white blood cell count: Secondary | ICD-10-CM | POA: Diagnosis present

## 2022-12-05 DIAGNOSIS — Z9151 Personal history of suicidal behavior: Secondary | ICD-10-CM

## 2022-12-05 DIAGNOSIS — M6282 Rhabdomyolysis: Secondary | ICD-10-CM | POA: Diagnosis not present

## 2022-12-05 DIAGNOSIS — Z7982 Long term (current) use of aspirin: Secondary | ICD-10-CM | POA: Diagnosis not present

## 2022-12-05 DIAGNOSIS — F1510 Other stimulant abuse, uncomplicated: Secondary | ICD-10-CM | POA: Diagnosis present

## 2022-12-05 DIAGNOSIS — F319 Bipolar disorder, unspecified: Secondary | ICD-10-CM | POA: Diagnosis present

## 2022-12-05 DIAGNOSIS — Z23 Encounter for immunization: Secondary | ICD-10-CM | POA: Diagnosis not present

## 2022-12-05 DIAGNOSIS — Z91148 Patient's other noncompliance with medication regimen for other reason: Secondary | ICD-10-CM

## 2022-12-05 DIAGNOSIS — Z818 Family history of other mental and behavioral disorders: Secondary | ICD-10-CM

## 2022-12-05 DIAGNOSIS — E785 Hyperlipidemia, unspecified: Secondary | ICD-10-CM | POA: Diagnosis present

## 2022-12-05 DIAGNOSIS — F4310 Post-traumatic stress disorder, unspecified: Secondary | ICD-10-CM | POA: Diagnosis present

## 2022-12-05 DIAGNOSIS — F17210 Nicotine dependence, cigarettes, uncomplicated: Secondary | ICD-10-CM | POA: Diagnosis present

## 2022-12-05 DIAGNOSIS — Z823 Family history of stroke: Secondary | ICD-10-CM | POA: Diagnosis not present

## 2022-12-05 DIAGNOSIS — Z800 Family history of malignant neoplasm of digestive organs: Secondary | ICD-10-CM | POA: Diagnosis not present

## 2022-12-05 DIAGNOSIS — F3130 Bipolar disorder, current episode depressed, mild or moderate severity, unspecified: Principal | ICD-10-CM | POA: Diagnosis present

## 2022-12-05 DIAGNOSIS — G629 Polyneuropathy, unspecified: Secondary | ICD-10-CM | POA: Diagnosis present

## 2022-12-05 DIAGNOSIS — F1210 Cannabis abuse, uncomplicated: Secondary | ICD-10-CM | POA: Diagnosis present

## 2022-12-05 DIAGNOSIS — Z21 Asymptomatic human immunodeficiency virus [HIV] infection status: Secondary | ICD-10-CM | POA: Diagnosis present

## 2022-12-05 DIAGNOSIS — R7303 Prediabetes: Secondary | ICD-10-CM | POA: Diagnosis present

## 2022-12-05 DIAGNOSIS — I10 Essential (primary) hypertension: Secondary | ICD-10-CM | POA: Diagnosis present

## 2022-12-05 DIAGNOSIS — K219 Gastro-esophageal reflux disease without esophagitis: Secondary | ICD-10-CM | POA: Diagnosis present

## 2022-12-05 LAB — CBC
HCT: 35.5 % — ABNORMAL LOW (ref 39.0–52.0)
Hemoglobin: 11.8 g/dL — ABNORMAL LOW (ref 13.0–17.0)
MCH: 29.9 pg (ref 26.0–34.0)
MCHC: 33.2 g/dL (ref 30.0–36.0)
MCV: 90.1 fL (ref 80.0–100.0)
Platelets: 157 10*3/uL (ref 150–400)
RBC: 3.94 MIL/uL — ABNORMAL LOW (ref 4.22–5.81)
RDW: 13.1 % (ref 11.5–15.5)
WBC: 5.6 10*3/uL (ref 4.0–10.5)
nRBC: 0 % (ref 0.0–0.2)

## 2022-12-05 LAB — COMPREHENSIVE METABOLIC PANEL
ALT: 141 U/L — ABNORMAL HIGH (ref 0–44)
AST: 407 U/L — ABNORMAL HIGH (ref 15–41)
Albumin: 3 g/dL — ABNORMAL LOW (ref 3.5–5.0)
Alkaline Phosphatase: 58 U/L (ref 38–126)
Anion gap: 6 (ref 5–15)
BUN: 25 mg/dL — ABNORMAL HIGH (ref 6–20)
CO2: 23 mmol/L (ref 22–32)
Calcium: 8.5 mg/dL — ABNORMAL LOW (ref 8.9–10.3)
Chloride: 107 mmol/L (ref 98–111)
Creatinine, Ser: 1.02 mg/dL (ref 0.61–1.24)
GFR, Estimated: >60 mL/min (ref >60)
Glucose, Bld: 102 mg/dL — ABNORMAL HIGH (ref 70–99)
Potassium: 3.8 mmol/L (ref 3.5–5.1)
Sodium: 136 mmol/L (ref 135–145)
Total Bilirubin: 0.6 mg/dL (ref <1.2)
Total Protein: 5.7 g/dL — ABNORMAL LOW (ref 6.5–8.1)

## 2022-12-05 LAB — CK: Total CK: 18183 U/L — ABNORMAL HIGH (ref 49–397)

## 2022-12-05 MED ORDER — QUETIAPINE FUMARATE 50 MG PO TABS
150.0000 mg | ORAL_TABLET | Freq: Every day | ORAL | Status: DC
  Filled 2022-12-05 (×4): qty 3

## 2022-12-05 MED ORDER — TRAZODONE HCL 50 MG PO TABS
50.0000 mg | ORAL_TABLET | Freq: Every evening | ORAL | Status: DC | PRN
  Administered 2022-12-06: 50 mg via ORAL
  Filled 2022-12-05 (×2): qty 1

## 2022-12-05 MED ORDER — MAGNESIUM HYDROXIDE 400 MG/5ML PO SUSP
30.0000 mL | Freq: Every day | ORAL | Status: DC | PRN
Start: 2022-12-05 — End: 2022-12-10

## 2022-12-05 MED ORDER — LORAZEPAM 1 MG PO TABS: 2.0000 mg | ORAL_TABLET | Freq: Four times a day (QID) | ORAL | Status: DC | PRN

## 2022-12-05 MED ORDER — BICTEGRAVIR-EMTRICITAB-TENOFOV 50-200-25 MG PO TABS
1.0000 | ORAL_TABLET | Freq: Every day | ORAL | Status: DC
  Administered 2022-12-06 – 2022-12-10 (×5): 1 via ORAL
  Filled 2022-12-05 (×9): qty 1

## 2022-12-05 MED ORDER — HYDROXYZINE HCL 25 MG PO TABS
25.0000 mg | ORAL_TABLET | Freq: Three times a day (TID) | ORAL | Status: DC | PRN
  Administered 2022-12-06 – 2022-12-08 (×3): 25 mg via ORAL
  Filled 2022-12-05 (×3): qty 1

## 2022-12-05 MED ORDER — LORAZEPAM 2 MG/ML IJ SOLN: 2.0000 mg | Freq: Four times a day (QID) | INTRAMUSCULAR | Status: DC | PRN

## 2022-12-05 MED ORDER — ALUM & MAG HYDROXIDE-SIMETH 200-200-20 MG/5ML PO SUSP
30.0000 mL | ORAL | Status: DC | PRN
  Administered 2022-12-07 – 2022-12-10 (×2): 30 mL via ORAL
  Filled 2022-12-05 (×2): qty 30

## 2022-12-05 MED ORDER — ACETAMINOPHEN 325 MG PO TABS
650.0000 mg | ORAL_TABLET | Freq: Four times a day (QID) | ORAL | Status: DC | PRN
  Administered 2022-12-06 – 2022-12-09 (×3): 650 mg via ORAL
  Filled 2022-12-05 (×3): qty 2

## 2022-12-05 MED ORDER — GABAPENTIN 300 MG PO CAPS
300.0000 mg | ORAL_CAPSULE | Freq: Three times a day (TID) | ORAL | Status: DC
Start: 2022-12-05 — End: 2022-12-10
  Administered 2022-12-06 – 2022-12-10 (×12): 300 mg via ORAL
  Filled 2022-12-05 (×22): qty 1

## 2022-12-05 MED ORDER — HALOPERIDOL 5 MG PO TABS: 5.0000 mg | ORAL_TABLET | Freq: Four times a day (QID) | ORAL | Status: DC | PRN

## 2022-12-05 MED ORDER — HALOPERIDOL LACTATE 5 MG/ML IJ SOLN: 5.0000 mg | Freq: Four times a day (QID) | INTRAMUSCULAR | Status: DC | PRN

## 2022-12-05 NOTE — Plan of Care (Signed)
  Problem: Education: Goal: Emotional status will improve Outcome: Progressing Goal: Mental status will improve Outcome: Progressing   Problem: Safety: Goal: Periods of time without injury will increase Outcome: Progressing   Problem: Activity: Goal: Interest or engagement in activities will improve Outcome: Not Progressing

## 2022-12-05 NOTE — Progress Notes (Signed)
Patient is a 57 year male admitted under IVC for suicide ideation with a plan to jump off the top floor of a parking garage. Pt was found on top of parking deck by Patent examiner. Pt denied current suicidal thoughts stating, " I got really sad after having a dream about my daughter". Pt stated that he is still grieving the death of his 57 year old daughter. Pt denied HI and AVH. Skin and safety check completed, pt observed to have old surgical scars to his left knee and his right foot. No contraband found. Pt noted to be irritable and angry initially admission assessment then apologized to staff for his outburst. Pt escorted on unit by RN and orientation provided with verbalization of understanding. Safety maintained.  12/05/22 1845  Psych Admission Type (Psych Patients Only)  Admission Status Involuntary  Psychosocial Assessment  Patient Complaints Anxiety;Irritability;Depression  Eye Contact Brief  Facial Expression Anxious;Angry  Affect Anxious;Angry;Depressed;Irritable  Speech Logical/coherent  Interaction Assertive  Motor Activity Slow  Appearance/Hygiene In scrubs  Behavior Characteristics Irritable  Mood Depressed;Anxious  Thought Process  Coherency WDL  Content WDL  Delusions None reported or observed  Perception WDL  Hallucination None reported or observed  Judgment WDL  Confusion WDL  Danger to Self  Current suicidal ideation? Denies  Agreement Not to Harm Self Yes  Description of Agreement Verbal  Danger to Others  Danger to Others None reported or observed

## 2022-12-05 NOTE — [facility] Group Notes (Signed)
Adult Psychoeducational Group Note  Date:  12/05/2022 Time:  8:45 PM  Group Topic/Focus:  Wrap-Up Group:   The focus of this group is to help patients review their daily goal of treatment and discuss progress on daily workbooks.  Participation Level:  Active  Participation Quality:  Appropriate  Affect:  Appropriate  Cognitive:  Appropriate  Insight: Appropriate  Engagement in Group:  Engaged  Modes of Intervention:  Discussion  Additional Comments:  Pt. Stated day was a 7, stated  goal for today was to rest.  Joselyn Arrow 12/05/2022, 8:45 PM

## 2022-12-05 NOTE — Progress Notes (Addendum)
Report called to [place] at [facility]. Pt belongings given (phone, wallet, and keys) Pt aware [facility] may have a different policy on belongings once he arrives.

## 2022-12-05 NOTE — Hospital Course (Addendum)
57yo with h/o HTN, HLD, bipolar d/o, HTN, and polysubstance abuse who presented on 11/25 with suicidal ideation after he intended to jump off the [place] floor of [place] parking [place].  He was found to have a CK >40000 that was thought to be related to nontraumatic rhabdo in the setting of crack cocaine use and so was observed in the hospital overnight.  Assuming improvement in rhabdo, he should be appropriate for dc to inpatient [facility] today.  He is under IVC paperwork.

## 2022-12-05 NOTE — Consult Note (Signed)
Brief Psychiatry Consult Note  I saw patient briefly to answer any questions he had about transfer to [facility].  To me, he endorsed significant ongoing depression, poor sleep. He was evasive when asked about current SI. He did not want to go to [facility]; made statements to the effect of "If I'm going to have to live I need to make money". Is 50-50 on whether he is happy he was stopped from jumping. Does discuss difficulty finding psych f/u in [place] and blames [facility], [facility] on not giving him outpt meds (it seems he waited until he ran out and couldn't secure same-day med mgmt). Ultimately discussed pt was under IVC at which point he said "nothing I say really matters" and ceased interacting with me.   -- accepted to [facility] under IVC; defer med mgmt -- orders in by NP [facility] -- [phone] low complexity  Claris Che A Asaf Elmquist

## 2022-12-05 NOTE — Progress Notes (Signed)
Pt refused HS medications.

## 2022-12-05 NOTE — Progress Notes (Addendum)
Pt refused morning medications and assessment, Pt educated on medications and states "I don't care." MD made aware.

## 2022-12-05 NOTE — Plan of Care (Signed)
  Problem: Education: Goal: Knowledge of [facility] Education information/materials will improve Outcome: Progressing   Problem: Physical Regulation: Goal: Ability to maintain clinical measurements within normal limits will improve Outcome: Progressing   Problem: Safety: Goal: Periods of time without injury will increase Outcome: Progressing

## 2022-12-05 NOTE — Tx Team (Signed)
Initial Treatment Plan 12/05/2022 7:16 PM Dylan [name] [id number]:[id number]    PATIENT STRESSORS: Health problems   Loss of    Daughter   PATIENT STRENGTHS: Capable of independent living  Motivation for treatment/growth  Supportive family/friends    PATIENT IDENTIFIED PROBLEMS: " Grief counseling".                     DISCHARGE CRITERIA:  Ability to meet basic life and health needs Motivation to continue treatment in a less acute level of care Safe-care adequate arrangements made  PRELIMINARY DISCHARGE PLAN: Return to previous living arrangement  PATIENT/FAMILY INVOLVEMENT: This treatment plan has been presented to and reviewed with the patient, Dylan [name].   The patient and family have been given the opportunity to ask questions and make suggestions.  Rosita Fire, RN 12/05/2022, 7:16 PM

## 2022-12-05 NOTE — Progress Notes (Signed)
   12/05/22 2000  Psych Admission Type (Psych Patients Only)  Admission Status Involuntary  Psychosocial Assessment  Patient Complaints Anxiety;Irritability  Eye Contact Fair  Facial Expression Anxious  Affect Anxious;Depressed  Speech Logical/coherent  Interaction Assertive  Motor Activity Slow  Appearance/Hygiene Disheveled  Behavior Characteristics Anxious  Mood Depressed;Anxious  Aggressive Behavior  Effect No apparent injury  Thought Process  Coherency WDL  Content WDL  Delusions WDL  Perception WDL  Hallucination None reported or observed  Judgment WDL  Confusion WDL  Danger to Self  Current suicidal ideation? Denies  Danger to Others  Danger to Others None reported or observed

## 2022-12-05 NOTE — Discharge Summary (Signed)
Physician Discharge Summary   Patient: Dylan [name] MRN: [id number] DOB: 1965/08/22  Admit date:     12/04/2022  Discharge date: 12/05/22  Discharge Physician: Jonah Blue   PCP: Hoy Register, MD   Recommendations at discharge:   You are being discharged to inpatient Behavioral Health for ongoing treatment Follow up with Dr. Alvis Lemmings after discharge from [facility] Follow up with GI to discuss fatty liver and RUQ ultrasound Follow up with ID, Dr. Drue Second  Discharge Diagnoses: Principal Problem:   Non-traumatic rhabdomyolysis Active Problems:   Suicidal ideations   Essential hypertension   HIV (human immunodeficiency virus infection) (HCC)   Dyslipidemia   Peripheral neuropathy   Polysubstance abuse (HCC)   Elevated LFTs   Acute renal failure (ARF) (HCC)   Generalized anxiety disorder   Fatty liver   PTSD (post-traumatic stress disorder)   Bipolar disorder, most recent episode depressed [facility])   Hospital Course: 57yo with h/o HTN, HLD, bipolar d/o, HTN, and polysubstance abuse who presented on 11/25 with suicidal ideation after he intended to jump off the top floor of the parking garage downtown.  He was found to have a CK >40000 that was thought to be related to nontraumatic rhabdo in the setting of crack cocaine use and so was observed in the hospital overnight.  Assuming improvement in rhabdo, he should be appropriate for dc to inpatient [facility] today.  He is under IVC paperwork.  Assessment and Plan:  Rhabdomyolysis History of prior nontraumatic rhabdomyolysis in the setting of crack cocaine use. CK checked in the process of clearing patient for transfer to behavioral health hospital.  Came back elevated to greater than 40,000. Did use crack cocaine yesterday.  Also reports working in a hot environment and working out more than usual. Received 2L bolus + 200cc/hr IVF with improvement in creatinine Given the creatinine peak and improvement, ongoing resolution is anticipated and  he is medically cleared for discharge to [facility]   Bipolar disorder/PTSD/Anxiety/Suicidal ideation and near attempt Patient presented under IVC with [employer] after being found at the top of the parking deck downtown standing on the ledge with plans to jump As per HPI does report this was to be a suicide attempt as he has chronic psychiatric illnesses and has lost a lot of family members including his daughter who died of 66 years old.  Had a dream about his daughter PTA Also use crack cocaine and marijuana PTA Evaluated by TTS in ED with plan for inpatient psychiatric admission behavioral health hospital however due to elevated CK as above unable to be medically cleared. Consulted psychiatry, he will be discharged to [facility] today Continue with gabapentin; reports not taking Seroquel, trazodone  Safety sitter/one-to-one observation until discharge Is under IVC   AKI Creatinine elevated to 1.38 on presentation from baseline of 1-1.1 Likely related to rhabdomyolysis as above Received IV fluids and now resolved   Elevated LFTs Has some chronic elevation of LFTs in the 40s to 70s. New more significant elevation is secondary to AKI/rhabdomyolysis This has also peaked and is downtrending Some fatty liver and echogenicity of gallbladder on RUQ [place]; recommended repeat US in 12 months Recommend outpatient f/u with PCP/GI   HIV Continue Biktarvy   Neuropathy Continue home gabapentin   Hypertension Hyperlipidemia Not on any medication for these issues   Consultants: Psychiatry  Procedures: None  Antibiotics: None     Pain control - [place] Controlled Substance Reporting System database was reviewed. and patient was instructed, not to drive, operate heavy  machinery, perform activities at heights, swimming or participation in water activities or provide baby-sitting services while on Pain, Sleep and Anxiety Medications; until their outpatient Physician has advised to do so again. Also  recommended to not to take more than prescribed Pain, Sleep and Anxiety Medications.   Disposition:  Inpatient psychiatric facility Diet recommendation:  Regular diet DISCHARGE MEDICATION: Allergies as of 12/05/2022       Reactions   Penicillins Other (See Comments)   Reaction type/severity unknown, childhood allergy   Pork-derived Products Other (See Comments)   Patient preference   Latex Rash   Tape Rash        Medication List     TAKE these medications    aspirin EC 81 MG tablet Take 1 tablet (81 mg total) by mouth daily. Swallow whole.   Biktarvy 50-200-25 MG Tabs tablet Generic drug: bictegravir-emtricitabine-tenofovir AF Take 1 tablet by mouth daily.   gabapentin 300 MG capsule Commonly known as: NEURONTIN Take 1 capsule (300 mg total) by mouth 3 (three) times daily.   meloxicam 7.5 MG tablet Commonly known as: MOBIC Take 1 tablet (7.5 mg total) by mouth daily. What changed:  when to take this reasons to take this   omeprazole 40 MG capsule Commonly known as: PRILOSEC Take 1 capsule (40 mg total) by mouth daily.   tiZANidine 4 MG tablet Commonly known as: Zanaflex Take 1 tablet (4 mg total) by mouth every 8 (eight) hours as needed.        Discharge Exam: There were no vitals filed for this visit.   Subjective: Patient requested phone, refused all interaction and exam.   Objective: Vitals:   12/05/22 0614 12/05/22 0819  BP: 95/64 (!) 84/57  Pulse: 70 69  Resp:  17  Temp: 97.7 F (36.5 C) 98.2 F (36.8 C)  SpO2: 97% 97%    Intake/Output Summary (Last 24 hours) at 12/05/2022 1201 Last data filed at 12/05/2022 0900 Gross per 24 hour  Intake 500 ml  Output --  Net 500 ml   There were no vitals filed for this visit.  Exam:  General:  Appears calm and comfortable and is in NAD Refused the remained of evaluation  Data Reviewed: I have reviewed the patient's lab results since admission.  Pertinent labs for today include:   BN  25/Creatinine 1.02/GFR >60, improved Albumin 3.0 AST 407/ALT 141, improved from 615/177 CK [address], down from 40294 WBC 5.6 Hgb 11.8    Condition at discharge: improving  The results of significant diagnostics from this hospitalization (including imaging, microbiology, ancillary and laboratory) are listed below for reference.   Imaging Studies: US Abdomen Limited RUQ (LIVER/GB)  Result Date: 12/04/2022 CLINICAL DATA:  Elevated liver function tests EXAM: ULTRASOUND ABDOMEN LIMITED RIGHT UPPER QUADRANT COMPARISON:  Ultrasound abdomen dated 07/01/2018 FINDINGS: Gallbladder: 7 mm nonshadowing echogenicity in the body of the gallbladder. No wall thickening visualized. No sonographic Murphy sign noted by sonographer. Common bile duct: Diameter: 5 mm Liver: Diffusely increased hepatic echogenicity can be seen in the setting of infiltrative process such as steatosis. Portal vein is patent on color Doppler imaging with normal direction of blood flow towards the liver. Other: None. IMPRESSION: 1. Diffusely increased hepatic echogenicity can be seen in the setting of infiltrative process such as steatosis. 2. A 7 mm nonshadowing echogenicity in the body of the gallbladder may represent a polyp or adherent stone. Recommend follow-up ultrasound in 12 months to assess for stability. Electronically Signed   By: Milus Height.D.  On: 12/04/2022 14:11    Microbiology: Results for orders placed or performed during the hospital encounter of 11/03/22  Respiratory (~20 pathogens) panel by PCR     Status: None   Collection Time: 11/03/22  9:07 AM   Specimen: Nasopharyngeal Swab; Respiratory  Result Value Ref Range Status   Adenovirus NOT DETECTED NOT DETECTED Final   Coronavirus 229E NOT DETECTED NOT DETECTED Final    Comment: (NOTE) The Coronavirus on the Respiratory Panel, DOES NOT test for the novel  Coronavirus (2019 nCoV)    Coronavirus HKU1 NOT DETECTED NOT DETECTED Final   Coronavirus NL63 NOT DETECTED  NOT DETECTED Final   Coronavirus OC43 NOT DETECTED NOT DETECTED Final   Metapneumovirus NOT DETECTED NOT DETECTED Final   Rhinovirus / Enterovirus NOT DETECTED NOT DETECTED Final   Influenza A NOT DETECTED NOT DETECTED Final   Influenza B NOT DETECTED NOT DETECTED Final   Parainfluenza Virus 1 NOT DETECTED NOT DETECTED Final   Parainfluenza Virus 2 NOT DETECTED NOT DETECTED Final   Parainfluenza Virus 3 NOT DETECTED NOT DETECTED Final   Parainfluenza Virus 4 NOT DETECTED NOT DETECTED Final   Respiratory Syncytial Virus NOT DETECTED NOT DETECTED Final   Bordetella pertussis NOT DETECTED NOT DETECTED Final   Bordetella Parapertussis NOT DETECTED NOT DETECTED Final   Chlamydophila pneumoniae NOT DETECTED NOT DETECTED Final   Mycoplasma pneumoniae NOT DETECTED NOT DETECTED Final    Comment: Performed at [facility], [address] [place], [place] [address]  SARS Coronavirus 2 by RT PCR (hospital order, performed in [facility] hospital lab) *cepheid single result test* Nasopharyngeal Swab     Status: None   Collection Time: 11/03/22  9:07 AM   Specimen: Nasopharyngeal Swab; Nasal Swab  Result Value Ref Range Status   SARS Coronavirus 2 by RT PCR NEGATIVE NEGATIVE Final    Comment: Performed at [facility], [address] [place], [place] [address]    Labs: CBC: Recent Labs  Lab 12/04/22 1125 12/05/22 0615  WBC 12.1* 5.6  HGB 14.9 11.8*  HCT 45.3 35.5*  MCV 90.4 90.1  PLT 215 157   Basic Metabolic Panel: Recent Labs  Lab 12/04/22 1125 12/05/22 0615  NA 136 136  K 4.0 3.8  CL 102 107  CO2 20* 23  GLUCOSE 87 102*  BUN 33* 25*  CREATININE 1.38* 1.02  CALCIUM 9.7 8.5*   Liver Function Tests: Recent Labs  Lab 12/04/22 1125 12/05/22 0615  AST 615* 407*  ALT 177* 141*  ALKPHOS 86 58  BILITOT 1.1 0.6  PROT 8.2* 5.7*  ALBUMIN 4.5 3.0*   CBG: No results for input(s): "GLUCAP" in the last 168 hours.  Discharge time spent: greater than 30  minutes.  Signed: Jonah Blue, MD [facility] 12/05/2022

## 2022-12-05 NOTE — Progress Notes (Signed)
CSW informed pt ready for transport to [facility].  Transport requested with [facility]. Daleen Squibb, MSW, LCSW 11/26/20244:13 PM

## 2022-12-06 ENCOUNTER — Encounter (HOSPITAL_COMMUNITY): Payer: Self-pay

## 2022-12-06 MED ORDER — ARIPIPRAZOLE 5 MG PO TABS
5.0000 mg | ORAL_TABLET | Freq: Once | ORAL | Status: AC
  Administered 2022-12-06: 5 mg via ORAL
  Filled 2022-12-06: qty 1

## 2022-12-06 MED ORDER — NICOTINE 14 MG/24HR TD PT24
14.0000 mg | MEDICATED_PATCH | Freq: Every day | TRANSDERMAL | Status: DC
  Administered 2022-12-06 – 2022-12-10 (×5): 14 mg via TRANSDERMAL
  Filled 2022-12-06 (×7): qty 1

## 2022-12-06 MED ORDER — ARIPIPRAZOLE 10 MG PO TABS
10.0000 mg | ORAL_TABLET | Freq: Every day | ORAL | Status: DC
  Administered 2022-12-07 – 2022-12-08 (×2): 10 mg via ORAL
  Filled 2022-12-06 (×4): qty 1

## 2022-12-06 NOTE — [facility] Suicide Risk Assessment (Signed)
[facility] INPATIENT:  Family/Significant Other Suicide Prevention Education  Suicide Prevention Education:  Patient Refusal for Family/Significant Other Suicide Prevention Education: The patient Dylan [name] has refused to provide written consent for family/significant other to be provided Family/Significant Other Suicide Prevention Education during admission and/or prior to discharge. Patient reports no one know he is in the hospital and he would like to process this on his own. Patient reports being at odds with his sister at this time and reports family will realized on tomorrow (Thanksgiving) that he is not around. Physician to be notified.  Georgiann Mohs Mykia Holton 12/06/2022, 4:00 PM

## 2022-12-06 NOTE — [facility] Group Notes (Signed)
Pt did not attend NA group

## 2022-12-06 NOTE — Group Note (Signed)
Recreation Therapy Group Note   Group Topic:Team Building  Group Date: 12/06/2022 Start Time: 0935 End Time: 1015 Facilitators: Matvey Llanas-McCall, LRT,CTRS Location: [address] Dayroom   Group Topic: Communication, Team Building, Problem Solving  Goal Area(s) Addresses:  Patient will effectively work with peer towards shared goal.  Patient will identify skills used to make activity successful.  Patient will identify how skills used during activity can be used to reach post d/c goals.   Intervention: STEM Activity  Group Description: Straw Bridge. In teams of 3-5, patients were given 15 plastic drinking straws and an equal length of masking tape. Using the materials provided, patients were instructed to build a free standing bridge-like structure to suspend an everyday item (ex: puzzle box) off of the floor or table surface. All materials were required to be used by the team in their design. LRT facilitated post-activity discussion reviewing team process. Patients were encouraged to reflect how the skills used in this activity can be generalized to daily life post discharge.   Education: Pharmacist, community, Scientist, physiological, Discharge Planning   Education Outcome: Acknowledges education/In group clarification offered/Needs additional education.    Affect/Mood: N/A   Participation Level: Did not attend    Clinical Observations/Individualized Feedback:      Plan: Continue to engage patient in RT group sessions 2-3x/week.   Maleia Weems-McCall, LRT,CTRS 12/06/2022 12:18 PM
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
## 2022-12-06 NOTE — Plan of Care (Signed)
Problem: Activity: Goal: Sleeping patterns will improve Outcome: Progressing   Problem: Safety: Goal: Periods of time without injury will increase Outcome: Progressing   Problem: Education: Goal: Emotional status will improve Outcome: Not Progressing Goal: Mental status will improve Outcome: Not Progressing   Problem: Activity: Goal: Interest or engagement in activities will improve Outcome: Not Progressing

## 2022-12-06 NOTE — BH IP Treatment Plan (Signed)
Interdisciplinary Treatment and Diagnostic Plan Update  12/06/2022 Time of Session: 10:30AM Dylan [name] MRN: [id number]  Principal Diagnosis: <principal problem not specified>  Secondary Diagnoses: Active Problems:   Bipolar 1 disorder, depressed (HCC)   Current Medications:  Current Facility-Administered Medications  Medication Dose Route Frequency Provider Last Rate Last Admin   acetaminophen (TYLENOL) tablet 650 mg  650 mg Oral Q6H PRN Eligha Bridegroom, NP       alum & mag hydroxide-simeth (MAALOX/MYLANTA) 200-200-20 MG/5ML suspension 30 mL  30 mL Oral Q4H PRN Eligha Bridegroom, NP       [START ON 12/07/2022] ARIPiprazole (ABILIFY) tablet 10 mg  10 mg Oral Daily Carrion-Carrero, Margely, MD       bictegravir-emtricitabine-tenofovir AF (BIKTARVY) 50-200-25 MG per tablet 1 tablet  1 tablet Oral Daily Eligha Bridegroom, NP   1 tablet at 12/06/22 1254   gabapentin (NEURONTIN) capsule 300 mg  300 mg Oral TID Eligha Bridegroom, NP   300 mg at 12/06/22 1253   haloperidol (HALDOL) tablet 5 mg  5 mg Oral Q6H PRN Eligha Bridegroom, NP       Or   haloperidol lactate (HALDOL) injection 5 mg  5 mg Intramuscular Q6H PRN Eligha Bridegroom, NP       hydrOXYzine (ATARAX) tablet 25 mg  25 mg Oral TID PRN Eligha Bridegroom, NP       LORazepam (ATIVAN) tablet 2 mg  2 mg Oral Q6H PRN Eligha Bridegroom, NP       Or   LORazepam (ATIVAN) injection 2 mg  2 mg Intramuscular Q6H PRN Eligha Bridegroom, NP       magnesium hydroxide (MILK OF MAGNESIA) suspension 30 mL  30 mL Oral Daily PRN Eligha Bridegroom, NP       nicotine (NICODERM CQ - dosed in mg/24 hours) patch 14 mg  14 mg Transdermal Daily Carrion-Carrero, Margely, MD   14 mg at 12/06/22 1253   traZODone (DESYREL) tablet 50 mg  50 mg Oral QHS PRN Eligha Bridegroom, NP       PTA Medications: Medications Prior to Admission  Medication Sig Dispense Refill Last Dose   aspirin EC 81 MG tablet Take 1 tablet (81 mg total) by mouth daily. Swallow whole. 30 tablet 12     bictegravir-emtricitabine-tenofovir AF (BIKTARVY) 50-200-25 MG TABS tablet Take 1 tablet by mouth daily. 30 tablet 0    gabapentin (NEURONTIN) 300 MG capsule Take 1 capsule (300 mg total) by mouth 3 (three) times daily. 90 capsule 3    meloxicam (MOBIC) 7.5 MG tablet Take 1 tablet (7.5 mg total) by mouth daily. (Patient taking differently: Take 7.5 mg by mouth daily as needed for pain.) 30 tablet 1    omeprazole (PRILOSEC) 40 MG capsule Take 1 capsule (40 mg total) by mouth daily. 30 capsule 3    tiZANidine (ZANAFLEX) 4 MG tablet Take 1 tablet (4 mg total) by mouth every 8 (eight) hours as needed. 60 tablet 1     Patient Stressors: Health problems   Loss of     Patient Strengths: Capable of independent living  Motivation for treatment/growth  Supportive family/friends   Treatment Modalities: Medication Management, Group therapy, Case management,  1 to 1 session with clinician, Psychoeducation, Recreational therapy.   Physician Treatment Plan for Primary Diagnosis: <principal problem not specified> Long Term Goal(s): Improvement in symptoms so as ready for discharge   Short Term Goals: Ability to identify changes in lifestyle to reduce recurrence of condition will improve Ability to verbalize feelings will improve  Medication Management: Evaluate patient's response, side effects, and tolerance of medication regimen.  Therapeutic Interventions: 1 to 1 sessions, Unit Group sessions and Medication administration.  Evaluation of Outcomes: Not Progressing  Physician Treatment Plan for Secondary Diagnosis: Active Problems:   Bipolar 1 disorder, depressed (HCC)  Long Term Goal(s): Improvement in symptoms so as ready for discharge   Short Term Goals: Ability to identify changes in lifestyle to reduce recurrence of condition will improve Ability to verbalize feelings will improve     Medication Management: Evaluate patient's response, side effects, and tolerance of medication  regimen.  Therapeutic Interventions: 1 to 1 sessions, Unit Group sessions and Medication administration.  Evaluation of Outcomes: Not Progressing   RN Treatment Plan for Primary Diagnosis: <principal problem not specified> Long Term Goal(s): Knowledge of disease and therapeutic regimen to maintain health will improve  Short Term Goals: Ability to remain free from injury will improve, Ability to verbalize frustration and anger appropriately will improve, Ability to demonstrate self-control, Ability to participate in decision making will improve, Ability to verbalize feelings will improve, Ability to disclose and discuss suicidal ideas, Ability to identify and develop effective coping behaviors will improve, and Compliance with prescribed medications will improve  Medication Management: RN will administer medications as ordered by provider, will assess and evaluate patient's response and provide education to patient for prescribed medication. RN will report any adverse and/or side effects to prescribing provider.  Therapeutic Interventions: 1 on 1 counseling sessions, Psychoeducation, Medication administration, Evaluate responses to treatment, Monitor vital signs and CBGs as ordered, Perform/monitor CIWA, COWS, AIMS and Fall Risk screenings as ordered, Perform wound care treatments as ordered.  Evaluation of Outcomes: Not Progressing   LCSW Treatment Plan for Primary Diagnosis: <principal problem not specified> Long Term Goal(s): Safe transition to appropriate next level of care at discharge, Engage patient in therapeutic group addressing interpersonal concerns.  Short Term Goals: Engage patient in aftercare planning with referrals and resources, Increase social support, Increase ability to appropriately verbalize feelings, Increase emotional regulation, Facilitate acceptance of mental health diagnosis and concerns, Facilitate patient progression through stages of change regarding substance use  diagnoses and concerns, Identify triggers associated with mental health/substance abuse issues, and Increase skills for wellness and recovery  Therapeutic Interventions: Assess for all discharge needs, 1 to 1 time with Social worker, Explore available resources and support systems, Assess for adequacy in community support network, Educate family and significant other(s) on suicide prevention, Complete Psychosocial Assessment, Interpersonal group therapy.  Evaluation of Outcomes: Not Progressing   Progress in Treatment: Attending groups: No. Participating in groups: No. Taking medication as prescribed: Yes. Toleration medication: Yes. Family/Significant other contact made: No, will contact:  consent pending Patient understands diagnosis: Yes. Discussing patient identified problems/goals with staff: Yes. Medical problems stabilized or resolved: Yes. Denies suicidal/homicidal ideation: Yes. Issues/concerns per patient self-inventory: No.  New problem(s) identified: No, Describe:  None reported  New Short Term/Long Term Goal(s): medication stabilization, elimination of SI thoughts, development of comprehensive mental wellness plan.    Patient Goals:  "Grief counseling"  Discharge Plan or Barriers: Patient recently admitted. CSW will continue to follow and assess for appropriate referrals and possible discharge planning.    Reason for Continuation of Hospitalization:  Depression Medication stabilization Suicidal ideation  Estimated Length of Stay: 5-7 days  Last 3 [place] Suicide Severity Risk Score: Flowsheet Row [facility] (Current[facility] Hosp-Admission (Discharged) from 12/04/2022 in [facility]  ED to Hosp-Admission (Discharged) from 11/03/2022 in [place] [facility]  C-SSRS RISK CATEGORY High Risk High Risk No Risk       Last PHQ 2/9 Scores:    09/11/2022   12:53  PM 01/04/2022   10:14 AM 12/22/2021    4:01 PM  Depression screen PHQ 2/9  Decreased Interest 1 0 0  Down, Depressed, Hopeless 1 2 0  PHQ - 2 Score 2 2 0  Altered sleeping 0 3   Tired, decreased energy 0 1   Change in appetite 0 0   Feeling bad or failure about yourself  1 0   Trouble concentrating 0 0   Moving slowly or fidgety/restless 0 0   Suicidal thoughts 1 0   PHQ-9 Score 4 6   Difficult doing work/chores Somewhat difficult      Scribe for Treatment Team: Esmeralda Arthur 12/06/2022 12:55 PM

## 2022-12-06 NOTE — Progress Notes (Signed)
Pt stated he wanted something for anxiety like "Xanax" , Clinical research associate educated pt on issues with Benzos , pt encouraged to talk to the doctor about something that could help him through the day "while I'm at work" .
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
## 2022-12-06 NOTE — Progress Notes (Signed)
Patient refused his 5 pm medication yelling at writer, " Leave me alone". Pt also refused to provide ordered urine sample. MD notified. Safety maintained.
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
## 2022-12-06 NOTE — Plan of Care (Signed)
Problem: Activity: Goal: Sleeping patterns will improve Outcome: Progressing   Problem: Safety: Goal: Periods of time without injury will increase Outcome: Progressing

## 2022-12-06 NOTE — Progress Notes (Signed)
   12/06/22 2045  Psych Admission Type (Psych Patients Only)  Admission Status Involuntary  Psychosocial Assessment  Eye Contact Fair  Facial Expression Anxious  Affect Anxious;Depressed  Speech Logical/coherent  Interaction Assertive  Motor Activity Slow  Appearance/Hygiene Disheveled  Behavior Characteristics Irritable  Mood Depressed;Angry  Aggressive Behavior  Effect No apparent injury  Thought Process  Coherency WDL  Content WDL  Delusions WDL  Perception WDL  Hallucination None reported or observed  Judgment WDL  Confusion WDL  Danger to Self  Current suicidal ideation? Denies  Danger to Others  Danger to Others None reported or observed

## 2022-12-07 LAB — COMPREHENSIVE METABOLIC PANEL
ALT: 143 U/L — ABNORMAL HIGH (ref 0–44)
AST: 148 U/L — ABNORMAL HIGH (ref 15–41)
Albumin: 4.1 g/dL (ref 3.5–5.0)
Alkaline Phosphatase: 79 U/L (ref 38–126)
Anion gap: 9 (ref 5–15)
BUN: 14 mg/dL (ref 6–20)
CO2: 27 mmol/L (ref 22–32)
Calcium: 9.6 mg/dL (ref 8.9–10.3)
Chloride: 99 mmol/L (ref 98–111)
Creatinine, Ser: 1.07 mg/dL (ref 0.61–1.24)
GFR, Estimated: >60 mL/min (ref >60)
Glucose, Bld: 135 mg/dL — ABNORMAL HIGH (ref 70–99)
Potassium: 3.7 mmol/L (ref 3.5–5.1)
Sodium: 135 mmol/L (ref 135–145)
Total Bilirubin: 0.5 mg/dL (ref <1.2)
Total Protein: 7.5 g/dL (ref 6.5–8.1)

## 2022-12-07 LAB — CBC WITH DIFFERENTIAL/PLATELET
Abs Immature Granulocytes: 0.02 10*3/uL (ref 0.00–0.07)
Basophils Absolute: 0 10*3/uL (ref 0.0–0.1)
Basophils Relative: 0 %
Eosinophils Absolute: 0.3 10*3/uL (ref 0.0–0.5)
Eosinophils Relative: 5 %
HCT: 42.3 % (ref 39.0–52.0)
Hemoglobin: 14.4 g/dL (ref 13.0–17.0)
Immature Granulocytes: 0 %
Lymphocytes Relative: 39 %
Lymphs Abs: 2.3 10*3/uL (ref 0.7–4.0)
MCH: 31.6 pg (ref 26.0–34.0)
MCHC: 34 g/dL (ref 30.0–36.0)
MCV: 93 fL (ref 80.0–100.0)
Monocytes Absolute: 0.4 10*3/uL (ref 0.1–1.0)
Monocytes Relative: 6 %
Neutro Abs: 2.9 10*3/uL (ref 1.7–7.7)
Neutrophils Relative %: 50 %
Platelets: 212 10*3/uL (ref 150–400)
RBC: 4.55 MIL/uL (ref 4.22–5.81)
RDW: 12.8 % (ref 11.5–15.5)
WBC: 5.8 10*3/uL (ref 4.0–10.5)
nRBC: 0 % (ref 0.0–0.2)

## 2022-12-07 LAB — CK: Total CK: 3602 U/L — ABNORMAL HIGH (ref 49–397)

## 2022-12-07 MED ORDER — TRAZODONE HCL 100 MG PO TABS
100.0000 mg | ORAL_TABLET | Freq: Every evening | ORAL | Status: DC | PRN
  Administered 2022-12-07 – 2022-12-08 (×2): 100 mg via ORAL
  Filled 2022-12-07 (×2): qty 1

## 2022-12-07 NOTE — Progress Notes (Signed)
   12/07/22 0800  Psych Admission Type (Psych Patients Only)  Admission Status Involuntary  Psychosocial Assessment  Patient Complaints Irritability  Eye Contact Brief  Facial Expression Anxious  Affect Appropriate to circumstance  Speech Logical/coherent  Interaction Assertive  Motor Activity Slow  Appearance/Hygiene Disheveled  Behavior Characteristics Irritable  Mood Anxious;Depressed  Aggressive Behavior  Effect No apparent injury  Thought Process  Coherency WDL  Content WDL  Delusions WDL  Perception WDL  Hallucination None reported or observed  Judgment WDL  Confusion WDL  Danger to Self  Current suicidal ideation? Denies  Danger to Others  Danger to Others None reported or observed

## 2022-12-07 NOTE — Group Note (Signed)
[facility] LCSW Group Therapy Note  Date/Time: 12/07/2022 at 11:00AM - 12:00PM  Type of Therapy/Topic:  Group Therapy:  Journey and Not the Destination  Participation Level:  Patient did not attend group on today. Patients are encouraged to participate in all programming on the milieu.

## 2022-12-07 NOTE — Progress Notes (Signed)
D. Pt initially presented irritable this morning due to having missed breakfast- but mood has since improved. Pt has been calm, and cooperative, and has been visible in the milieu, observed attending group. Pt did complain of some body aches this am,  and was given Tylenol for pain relief. Pt currently denies SI/HI and AVH  A. Labs and vitals monitored. Pt given and educated on medications. Pt provided with a personal pitcher of Gatorade and encouraged to push fluids. Pt supported emotionally and encouraged to express concerns and ask questions.   R. Pt remains safe with 15 minute checks. Will continue POC.

## 2022-12-07 NOTE — Progress Notes (Signed)
   12/07/22 2015  Psych Admission Type (Psych Patients Only)  Admission Status Involuntary  Psychosocial Assessment  Patient Complaints Irritability  Eye Contact Fair  Facial Expression Anxious  Affect Anxious;Depressed  Speech Logical/coherent  Interaction Assertive  Motor Activity Slow  Appearance/Hygiene Disheveled  Behavior Characteristics Irritable  Mood Anxious;Depressed  Aggressive Behavior  Effect No apparent injury  Thought Process  Coherency WDL  Content WDL  Delusions WDL  Perception WDL  Hallucination None reported or observed  Judgment WDL  Confusion WDL  Danger to Self  Current suicidal ideation? Denies  Danger to Others  Danger to Others None reported or observed

## 2022-12-07 NOTE — Plan of Care (Signed)
Problem: Education: Goal: Emotional status will improve Outcome: Progressing Goal: Mental status will improve Outcome: Progressing
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
## 2022-12-07 NOTE — Plan of Care (Signed)
  Problem: Education: Goal: Emotional status will improve Outcome: Progressing Goal: Mental status will improve Outcome: Progressing   Problem: Activity: Goal: Interest or engagement in activities will improve Outcome: Progressing Goal: Sleeping patterns will improve Outcome: Progressing   Problem: Safety: Goal: Periods of time without injury will increase Outcome: Progressing   

## 2022-12-07 NOTE — Group Note (Signed)
Date:  12/07/2022 Time:  9:11 AM  Group Topic/Focus:  Goals Group:   The focus of this group is to help patients establish daily goals to achieve during treatment and discuss how the patient can incorporate goal setting into their daily lives to aide in recovery. Orientation:   The focus of this group is to educate the patient on the purpose and policies of crisis stabilization and provide a format to answer questions about their admission.  The group details unit policies and expectations of patients while admitted.    Participation Level:  Active  Participation Quality:  Appropriate  Affect:  Appropriate  Cognitive:  Appropriate  Insight: Appropriate  Engagement in Group:  Engaged  Modes of Intervention:  Discussion and Education  Additional Comments:    Arnoldo Hooker 12/07/2022, 9:11 AM

## 2022-12-07 NOTE — [facility] Group Notes (Signed)
[facility] Group Notes:  (Nursing/MHT/Case Management/Adjunct)  Date:  12/07/2022  Time:  9:13 PM  Type of Therapy:   Wrap-up group  Participation Level:  Active  Participation Quality:  Appropriate  Affect:  Appropriate  Cognitive:  Appropriate  Insight:  Appropriate  Engagement in Group:  Engaged  Modes of Intervention:  Education  Summary of Progress/Problems: Goal  Learn something new. Rated day 8/10.  Dylan [name] 12/07/2022, 9:13 PM

## 2022-12-08 DIAGNOSIS — F319 Bipolar disorder, unspecified: Secondary | ICD-10-CM | POA: Diagnosis not present

## 2022-12-08 LAB — RAPID URINE DRUG SCREEN, HOSP PERFORMED
Amphetamines: NOT DETECTED
Barbiturates: NOT DETECTED
Benzodiazepines: NOT DETECTED
Cocaine: POSITIVE — AB
Opiates: NOT DETECTED
Tetrahydrocannabinol: NOT DETECTED

## 2022-12-08 LAB — LIPID PANEL
Cholesterol: 245 mg/dL — ABNORMAL HIGH (ref 0–200)
HDL: 44 mg/dL (ref >40)
LDL Cholesterol: 151 mg/dL — ABNORMAL HIGH (ref 0–99)
Total CHOL/HDL Ratio: 5.6 {ratio}
Triglycerides: 251 mg/dL — ABNORMAL HIGH (ref <150)
VLDL: 50 mg/dL — ABNORMAL HIGH (ref 0–40)

## 2022-12-08 LAB — HEMOGLOBIN A1C
Hgb A1c MFr Bld: 5.9 % — ABNORMAL HIGH (ref 4.8–5.6)
Mean Plasma Glucose: 122.63 mg/dL

## 2022-12-08 MED ORDER — LORATADINE 10 MG PO TABS
10.0000 mg | ORAL_TABLET | Freq: Every day | ORAL | Status: DC | PRN
  Administered 2022-12-08 – 2022-12-09 (×2): 10 mg via ORAL
  Filled 2022-12-08 (×2): qty 1

## 2022-12-08 MED ORDER — MENTHOL 3 MG MT LOZG
1.0000 | LOZENGE | OROMUCOSAL | Status: DC | PRN
  Administered 2022-12-08 (×3): 3 mg via ORAL
  Filled 2022-12-08 (×2): qty 9

## 2022-12-08 MED ORDER — PSEUDOEPHEDRINE HCL 30 MG PO TABS
30.0000 mg | ORAL_TABLET | Freq: Three times a day (TID) | ORAL | Status: DC | PRN
  Administered 2022-12-08 – 2022-12-10 (×6): 30 mg via ORAL
  Filled 2022-12-08 (×6): qty 1

## 2022-12-08 MED ORDER — SALINE SPRAY 0.65 % NA SOLN
1.0000 | NASAL | Status: DC | PRN
  Administered 2022-12-09: 1 via NASAL
  Filled 2022-12-08: qty 44

## 2022-12-08 MED ORDER — ARIPIPRAZOLE 15 MG PO TABS
15.0000 mg | ORAL_TABLET | Freq: Every day | ORAL | Status: DC
  Administered 2022-12-09: 15 mg via ORAL
  Filled 2022-12-08 (×3): qty 1

## 2022-12-08 NOTE — Progress Notes (Signed)
Patient ID: Dylan [name], male   DOB: 05/02/1965, 57 y.o.   MRN: [id number] Pt presents with irritable mood, affect congruent. Lashone refused to come to med window for medication scanning this am, stating he still wanted to sleep. He has been isolative to his room except for meal times but reports that he is '' still feeling weak from being in the hospital with my legs, I just feel tired and I have a head cold. '' Encouraged po hydration and large pitcher of gatorade given with ice. Additionally pt requested sudafed , md notified and orders received. Pt accepted without issue.  Pt denies any SI HI . No signs pt is responding to internal stimuli. Will con't to monitor.

## 2022-12-08 NOTE — Group Note (Signed)
Date:  12/08/2022 Time:  9:56 AM  Group Topic/Focus:  Orientation:   The focus of this group is to educate the patient on the purpose and policies of crisis stabilization and provide a format to answer questions about their admission.  The group details unit policies and expectations of patients while admitted.    Participation Level:  Active  Participation Quality:  Appropriate  Affect:  Appropriate  Cognitive:  Appropriate  Insight: Appropriate  Engagement in Group:  Engaged  Modes of Intervention:  Discussion  Additional Comments:     Reymundo Poll 12/08/2022, 9:56 AM

## 2022-12-08 NOTE — Progress Notes (Signed)
   12/08/22 2015  Psych Admission Type (Psych Patients Only)  Admission Status Involuntary  Psychosocial Assessment  Patient Complaints Anxiety  Eye Contact Fair  Facial Expression Anxious  Affect Anxious;Depressed  Speech Logical/coherent  Interaction Assertive  Motor Activity Slow  Appearance/Hygiene Disheveled  Behavior Characteristics Cooperative;Irritable  Mood Depressed  Aggressive Behavior  Effect No apparent injury  Thought Process  Coherency WDL  Content WDL  Delusions WDL  Perception WDL  Hallucination None reported or observed  Judgment WDL  Confusion WDL  Danger to Self  Current suicidal ideation? Denies  Danger to Others  Danger to Others None reported or observed

## 2022-12-08 NOTE — Group Note (Signed)
Recreation Therapy Group Note   Group Topic:Problem Solving  Group Date: 12/08/2022 Start Time: 0930 End Time: 1000 Facilitators: Kalany Diekmann-McCall, LRT,CTRS Location: [address] Dayroom   Group Topic: Problem Solving  Goal Area(s) Addresses:  Patient will effectively work in a team with other group members. Patient will verbalize importance of using appropriate problem solving techniques.  Patient will identify positive change associated with effective problem solving skills.   Intervention: Worksheets, Pencils  Group Description: Dentist. Patients were given two sheets of brain teasers. Patients were given 20 minutes to try and figure out as many of teasers they could. Patients were also allowed to work together if they chose to. Once patients finished, LRT would go over the answers with the patients.    Education Outcome: Acknowledges understanding/In group clarification offered/Needs additional education.    Clinical Observations/Individualized Feedback: Due to previous group going over/exceeding time, recreation therapy group was unable to be held at scheduled time.     Plan: Continue to engage patient in RT group sessions 2-3x/week.   Kin Galbraith-McCall, LRT,CTRS 12/08/2022 1:16 PM
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
## 2022-12-08 NOTE — Group Note (Signed)
Date:  12/08/2022 Time:  1:43 PM  Group Topic/Focus:  Building Self Esteem:   The Focus of this group is helping patients become aware of the effects of self-esteem on their lives, the things they and others do that enhance or undermine their self-esteem, seeing the relationship between their level of self-esteem and the choices they make and learning ways to enhance self-esteem.    Participation Level:  Active  Participation Quality:  Appropriate  Affect:  Appropriate  Cognitive:  Appropriate  Insight: Appropriate  Engagement in Group:  Engaged  Modes of Intervention:  Education  Additional Comments:     Reymundo Poll 12/08/2022, 1:43 PM

## 2022-12-08 NOTE — Plan of Care (Signed)

## 2022-12-08 NOTE — Plan of Care (Signed)
  Problem: Physical Regulation: Goal: Ability to maintain clinical measurements within normal limits will improve Outcome: Not Progressing   Problem: Education: Goal: Emotional status will improve Outcome: Progressing Goal: Mental status will improve Outcome: Progressing   Problem: Coping: Goal: Ability to demonstrate self-control will improve Outcome: Progressing   Problem: Safety: Goal: Periods of time without injury will increase Outcome: Progressing

## 2022-12-09 DIAGNOSIS — F319 Bipolar disorder, unspecified: Secondary | ICD-10-CM | POA: Diagnosis not present

## 2022-12-09 LAB — SARS CORONAVIRUS 2 BY RT PCR: SARS Coronavirus 2 by RT PCR: NEGATIVE

## 2022-12-09 MED ORDER — TRAZODONE HCL 100 MG PO TABS
100.0000 mg | ORAL_TABLET | Freq: Every evening | ORAL | Status: DC | PRN
  Administered 2022-12-09: 100 mg via ORAL
  Filled 2022-12-09: qty 1

## 2022-12-09 MED ORDER — INFLUENZA VIRUS VACC SPLIT PF (FLUZONE) 0.5 ML IM SUSY
0.5000 mL | PREFILLED_SYRINGE | Freq: Once | INTRAMUSCULAR | Status: AC
  Administered 2022-12-09: 0.5 mL via INTRAMUSCULAR

## 2022-12-09 MED ORDER — GUAIFENESIN 100 MG/5ML PO LIQD
5.0000 mL | ORAL | Status: DC | PRN
  Administered 2022-12-09 – 2022-12-10 (×5): 5 mL via ORAL
  Filled 2022-12-09 (×5): qty 10

## 2022-12-09 MED ORDER — INFLUENZA VAC A&B SURF ANT ADJ 0.5 ML IM SUSY
0.5000 mL | PREFILLED_SYRINGE | Freq: Once | INTRAMUSCULAR | Status: DC
  Filled 2022-12-09: qty 0.5

## 2022-12-09 NOTE — Progress Notes (Signed)
[facility] MD Progress Note  12/09/2022 8:02 AM [name]  MRN:  [id number]  Principal Problem: <principal problem not specified> Diagnosis: Active Problems:   Bipolar 1 disorder, depressed (HCC)  57 y.o., male with PPH of bipolar 1 d/o, PTSD, alcohol use d/o, stimulant use d/o (meth, cocaine), tobacco use d/o, suicide attempt x4, multiple inpatient psych admission (last time August 2024 [facility]), who presented on 11/[facility] to [facility] following de-escalation of suicide attempt where patient was found about to jump off a parking garage, was medically admitted for nontraumatic rhabdomyolysis and later admitted to [facility] on 12/05/2022 for crisis stabilization and management of acute on chronic psychiatric symptoms.  Past medical history significant for HIV and hepatitis B.    Interval History Patient was seen today for re-evaluation.  Nursing reports no events overnight. The patient has no issues with performing ADLs.  Patient has been medication compliant.    Patient was seen and interviewed by attending psychiatrist. Chart reviewed. Patient discussed during treatment team rounds.  Subjective:  On assessment patient reports "feeling good, ready to go home". He reports good mood, denies feeling depressed, anxious. He reports good sleep and appetite. He tolerates oral Abilify well, still agreeable with the dose increase today and possible injection tomorrow. He denies any symptoms of psychosis - denies auditory or visual hallucinations, denies feeling paranoid, unsafe, does not express any delusions. He denies thoughts or plans of hurting self or others. He denies any physical complaints. He is asking for discharge, concerned about absence from work on Monday.  Labs: no new results for review.     Total Time spent with patient: 20 minutes  Past Psychiatric History: see H&P  Past Medical History:  Past Medical History:  Diagnosis Date   Anxiety    Arthritis    Bipolar 1 disorder (HCC)    Colon polyps     Depression    GERD (gastroesophageal reflux disease)    Hepatitis B    Human immunodeficiency virus (HIV) (HCC)    Hyperlipidemia    Hypertension    Insomnia due to other mental disorder 02/03/2020   Intentional drug overdose (HCC) 11/26/2021   Neuromuscular disorder (HCC)    neuropathy   Neuropathy    Pre-diabetes    Rhabdomyolysis 02/19/2017   Unilateral primary osteoarthritis, left knee 12/14/2021    Past Surgical History:  Procedure Laterality Date   FOOT ARTHRODESIS Right 09/17/2019   Procedure: FUSION RIGHT LISFRANC JOINT;  Surgeon: Nadara Mustard, MD;  Location: [facility] OR;  Service: Orthopedics;  Laterality: Right;   FOOT ARTHRODESIS Right 09/2019   HEMORROIDECTOMY     IR FLUORO GUIDE CV LINE RIGHT  02/22/2017   IR US GUIDE VASC ACCESS RIGHT  02/22/2017   TOTAL KNEE ARTHROPLASTY Left 12/14/2021   Procedure: LEFT TOTAL KNEE ARTHROPLASTY;  Surgeon: Nadara Mustard, MD;  Location: [facility] OR;  Service: Orthopedics;  Laterality: Left;   TUMOR REMOVAL     From Chest   Family History:  Family History  Problem Relation Age of Onset   CAD Mother    Diabetes Father    Stroke Father    Colon cancer Maternal Aunt 60   Diabetes Maternal Aunt    Heart disease Maternal Uncle    Heart attack Maternal Uncle    Esophageal cancer Neg Hx    Rectal cancer Neg Hx    Stomach cancer Neg Hx    Family Psychiatric  History: see H&P  Social History:  Social History   Substance and Sexual  Activity  Alcohol Use Yes   Comment: last drink March 2024     Social History   Substance and Sexual Activity  Drug Use Not Currently   Types: Cocaine, Marijuana   Comment: Last used cocaine March 2024; last used cannabis 2022    Social History   Socioeconomic History   Marital status: Married    Spouse name: Not on file   Number of children: 1   Years of education: Not on file   Highest education level: Not on file  Occupational History   Not on file  Tobacco Use   Smoking status: Former     Current packs/day: 0.50    Average packs/day: 0.5 packs/day for 25.0 years (12.5 ttl pk-yrs)    Types: E-cigarettes, Cigarettes   Smokeless tobacco: Never   Tobacco comments:    Vapes daily  Vaping Use   Vaping status: Every Day   Start date: 12/09/2020   Substances: Nicotine  Substance and Sexual Activity   Alcohol use: Yes    Comment: last drink March 2024   Drug use: Not Currently    Types: Cocaine, Marijuana    Comment: Last used cocaine March 2024; last used cannabis 2022   Sexual activity: Yes    Partners: Female  Other Topics Concern   Not on file  Social History Narrative   Lives home alone. 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   Social Determinants of Health   Financial Resource Strain: Not on file  Food Insecurity: No Food Insecurity (12/05/2022)   Hunger Vital Sign    Worried About Running Out of Food in the Last Year: Never true    Ran Out of Food in the Last Year: Never true  Recent Concern: Food Insecurity - Food Insecurity Present (12/04/2022)   Hunger Vital Sign    Worried About Running Out of Food in the Last Year: Sometimes true    Ran Out of Food in the Last Year: Never true  Transportation Needs: No Transportation Needs (12/05/2022)   PRAPARE - Administrator, Civil Service (Medical): No    Lack of Transportation (Non-Medical): No  Recent Concern: Transportation Needs - Unmet Transportation Needs (12/04/2022)   PRAPARE - Administrator, Civil Service (Medical): Yes    Lack of Transportation (Non-Medical): Yes  Physical Activity: Not on file  Stress: Not on file  Social Connections: Not on file   Additional Social History:                         Sleep: Fair  Appetite:  Good  Current Medications: Current Facility-Administered Medications  Medication Dose Route Frequency Provider Last Rate Last Admin   acetaminophen (TYLENOL) tablet 650 mg  650 mg Oral Q6H PRN Eligha Bridegroom, NP   650 mg at 12/07/22 0741    alum & mag hydroxide-simeth (MAALOX/MYLANTA) 200-200-20 MG/5ML suspension 30 mL  30 mL Oral Q4H PRN Eligha Bridegroom, NP   30 mL at 12/07/22 2050   ARIPiprazole (ABILIFY) tablet 15 mg  15 mg Oral Daily Carrion-Carrero, Margely, MD   15 mg at 12/09/22 0736   bictegravir-emtricitabine-tenofovir AF (BIKTARVY) 50-200-25 MG per tablet 1 tablet  1 tablet Oral Daily Eligha Bridegroom, NP   1 tablet at 12/09/22 0738   gabapentin (NEURONTIN) capsule 300 mg  300 mg Oral TID Eligha Bridegroom, NP   300 mg at 12/09/22 0737   haloperidol (HALDOL) tablet 5 mg  5 mg Oral Q6H PRN  Eligha Bridegroom, NP       Or   haloperidol lactate (HALDOL) injection 5 mg  5 mg Intramuscular Q6H PRN Eligha Bridegroom, NP       hydrOXYzine (ATARAX) tablet 25 mg  25 mg Oral TID PRN Eligha Bridegroom, NP   25 mg at 12/08/22 2136   loratadine (CLARITIN) tablet 10 mg  10 mg Oral Daily PRN Lorri Frederick, MD   10 mg at 12/09/22 0737   LORazepam (ATIVAN) tablet 2 mg  2 mg Oral Q6H PRN Eligha Bridegroom, NP       Or   LORazepam (ATIVAN) injection 2 mg  2 mg Intramuscular Q6H PRN Eligha Bridegroom, NP       magnesium hydroxide (MILK OF MAGNESIA) suspension 30 mL  30 mL Oral Daily PRN Eligha Bridegroom, NP       menthol-cetylpyridinium (CEPACOL) lozenge 3 mg  1 lozenge Oral PRN Onuoha, Chinwendu V, NP   3 mg at 12/08/22 2208   nicotine (NICODERM CQ - dosed in mg/24 hours) patch 14 mg  14 mg Transdermal Daily Carrion-Carrero, Karle Starch, MD   14 mg at 12/09/22 0738   pseudoephedrine (SUDAFED) tablet 30 mg  30 mg Oral Q8H PRN Carrion-Carrero, Karle Starch, MD   30 mg at 12/09/22 0737   sodium chloride (OCEAN) 0.65 % nasal spray 1 spray  1 spray Each Nare PRN Onuoha, Chinwendu V, NP       traZODone (DESYREL) tablet 100 mg  100 mg Oral QHS PRN Carrion-Carrero, Karle Starch, MD   100 mg at 12/08/22 2136    Lab Results:  Results for orders placed or performed during the hospital encounter of 12/05/22 (from the past 48 hour(s))  CBC with  Differential/Platelet     Status: None   Collection Time: 12/07/22  6:14 PM  Result Value Ref Range   WBC 5.8 4.0 - 10.5 K/uL   RBC 4.55 4.22 - 5.81 MIL/uL   Hemoglobin 14.4 13.0 - 17.0 g/dL   HCT 08.6 57.8 - 46.9 %   MCV 93.0 80.0 - 100.0 fL   MCH 31.6 26.0 - 34.0 pg   MCHC 34.0 30.0 - 36.0 g/dL   RDW 62.9 52.8 - 41.3 %   Platelets 212 150 - 400 K/uL   nRBC 0.0 0.0 - 0.2 %   Neutrophils Relative % 50 %   Neutro Abs 2.9 1.7 - 7.7 K/uL   Lymphocytes Relative 39 %   Lymphs Abs 2.3 0.7 - 4.0 K/uL   Monocytes Relative 6 %   Monocytes Absolute 0.4 0.1 - 1.0 K/uL   Eosinophils Relative 5 %   Eosinophils Absolute 0.3 0.0 - 0.5 K/uL   Basophils Relative 0 %   Basophils Absolute 0.0 0.0 - 0.1 K/uL   Immature Granulocytes 0 %   Abs Immature Granulocytes 0.02 0.00 - 0.07 K/uL    Comment: Performed at [facility], [address] [place], [place] [address]  CK     Status: Abnormal   Collection Time: 12/07/22  6:14 PM  Result Value Ref Range   Total CK 3,602 (H) 49 - 397 U/L    Comment: Performed at [facility], [address] [place], [place] [address]  Comprehensive metabolic panel     Status: Abnormal   Collection Time: 12/07/22  6:14 PM  Result Value Ref Range   Sodium 135 135 - 145 mmol/L   Potassium 3.7 3.5 - 5.1 mmol/L   Chloride 99 98 - 111 mmol/L   CO2 27  22 - 32 mmol/L   Glucose, Bld 135 (H) 70 - 99 mg/dL    Comment: Glucose reference range applies only to samples taken after fasting for at least 8 hours.   BUN 14 6 - 20 mg/dL   Creatinine, Ser 1.61 0.61 - 1.24 mg/dL   Calcium 9.6 8.9 - 09.6 mg/dL   Total Protein 7.5 6.5 - 8.1 g/dL   Albumin 4.1 3.5 - 5.0 g/dL   AST 045 (H) 15 - 41 U/L   ALT 143 (H) 0 - 44 U/L   Alkaline Phosphatase 79 38 - 126 U/L   Total Bilirubin 0.5 <1.2 mg/dL   GFR, Estimated >40 >98 mL/min    Comment: (NOTE) Calculated using the CKD-EPI Creatinine Equation (2021)    Anion gap 9 5 - 15    Comment:  Performed at [facility], [address] [place], [place] [address]  Hemoglobin A1c     Status: Abnormal   Collection Time: 12/08/22  6:44 AM  Result Value Ref Range   Hgb A1c MFr Bld 5.9 (H) 4.8 - 5.6 %    Comment: (NOTE) Pre diabetes:          5.7%-6.4%  Diabetes:              >6.4%  Glycemic control for   <7.0% adults with diabetes    Mean Plasma Glucose 122.63 mg/dL    Comment: Performed at [facility], [address], [place], [place] [address]  Lipid panel     Status: Abnormal   Collection Time: 12/08/22  6:44 AM  Result Value Ref Range   Cholesterol 245 (H) 0 - 200 mg/dL   Triglycerides 621 (H) <150 mg/dL   HDL 44 >30 mg/dL   Total CHOL/HDL Ratio 5.6 RATIO   VLDL 50 (H) 0 - 40 mg/dL   LDL Cholesterol 865 (H) 0 - 99 mg/dL    Comment:        Total Cholesterol/HDL:CHD Risk Coronary Heart Disease Risk Table                     Men   Women  1/2 Average Risk   3.4   3.3  Average Risk       5.0   4.4  2 X Average Risk   9.6   7.1  3 X Average Risk  23.4   11.0        Use the calculated Patient Ratio above and the CHD Risk Table to determine the patient's CHD Risk.        ATP III CLASSIFICATION (LDL):  <100     mg/dL   Optimal  784-696  mg/dL   Near or Above                    Optimal  130-159  mg/dL   Borderline  295-284  mg/dL   High  >132     mg/dL   Very High Performed at [facility], [address] [place], [place] [address]     Blood Alcohol level:  Lab Results  Component Value Date   [facility] <10 12/04/2022   ETH <10 09/11/2022    Metabolic Disorder Labs: Lab Results  Component Value Date   HGBA1C 5.9 (H) 12/08/2022   MPG 122.63 12/08/2022   MPG 131 04/03/2022   Lab Results  Component Value Date   PROLACTIN 2.6 (L) 08/25/2022   PROLACTIN 4.8 03/30/2022   Lab  Results  Component Value Date   CHOL 245 (H) 12/08/2022   TRIG 251 (H) 12/08/2022   HDL 44 12/08/2022   CHOLHDL 5.6 12/08/2022   VLDL 50  (H) 12/08/2022   LDLCALC 151 (H) 12/08/2022   LDLCALC 166 (H) 09/11/2022    Physical Findings: AIMS:  , ,  ,  ,    CIWA:    COWS:     Musculoskeletal: Strength & Muscle Tone: within normal limits Gait & Station: normal Patient leans: N/A  Psychiatric Specialty Exam:  Appearance:  AAM, appearing stated age,  wearing appropriate to the situation casual/hospital clothes, with fair grooming and hygiene. Normal level of alertness and appropriate facial expression.  Attitude/Behavior: calm, cooperative, engaging with appropriate eye contact.  Motor: WNL; dyskinesias not evident. Gait appears in full range.  Speech: spontaneous, clear, coherent, normal comprehension.  Mood: euthymic, "I am good".  Affect: appropriately-reactive, restricted.  Thought process: patient appears coherent, organized, logical, goal-directed.  Thought content: patient denies suicidal thoughts, denies homicidal thoughts; did not express any delusions.  Thought perception: patient denies auditory and visual hallucinations. Did not appear internally stimulated.  Cognition: patient is alert and oriented in self, place, date; with intact attention and concentration.  Insight: fair, in regards of understanding of presence, nature, cause, and significance of mental or emotional problem.  Judgement: fair, in regards of ability to make good decisions concerning the appropriate thing to do in various situations, including ability to form opinions regarding their mental health condition.  Assets  Assets: Manufacturing systems engineer; Desire for Improvement; Resilience   Sleep  Sleep:No data recorded   Physical Exam: Physical Exam ROS Blood pressure 125/76, pulse 74, temperature 98.2 F (36.8 C), temperature source Oral, resp. rate 16, height 6\' 2"  (1.88 m), weight 105.6 kg, SpO2 99%. Body mass index is 29.89 kg/m.   Treatment Plan Summary: Daily contact with patient to assess and evaluate symptoms and  progress in treatment and Medication management  ASSESSMENT: Dylan [name] is a 57 y.o., male with PPH of bipolar 1 d/o, PTSD, alcohol use d/o, stimulant use d/o (meth, cocaine), tobacco use d/o, suicide attempt x4, multiple inpatient psych admission (last time August 2024 [facility]), who presented [facility] 11/25 to [facility] following de-escalation of suicide attempt where patient was found about to jump off a parking garage, was medically admitted for nontraumatic rhabdomyolysis and later admitted to [facility] on 12/05/2022 for crisis stabilization and management of acute on chronic psychiatric symptoms.  Past medical history significant for HIV and hepatitis B.   Mood continues to improve. Titrating oral Abilify, with plans to transition to [employer].      Diagnoses / Active Problems: Bipolar 1 disorder, current episode depressed Alcohol use disorder, active Tobacco use disorder, active Cannabis use disorder active Stimulant use disorder     PLAN: Safety and Monitoring:             --  INVOLUNTARY  admission to inpatient psychiatric unit for safety, stabilization and treatment             -- Daily contact with patient to assess and evaluate symptoms and progress in treatment             -- Patient's case to be discussed in multi-disciplinary team meeting             -- Observation Level : q15 minute checks             -- Vital signs:  q12 hours             --  Precautions: suicide, elopement, and assault   2. Psychiatric Diagnoses and Treatment:  increase Abilify to 15 mg today to assess for tolerability for Abilify Maintena LAI If patient continues to tolerate PO will transition to Abilify Maintenna 400 mg IM tomorrow/Sunday Discontinued home Seroquel due to ongoing noncompliance  -- The risks/benefits/side-effects/alternatives to this medication were discussed in detail with the patient and time was given for questions. The patient consents to medication trial.              -- Metabolic  profile and EKG monitoring obtained while on an atypical antipsychotic  BMI: 29.89 kg/m TSH: 0.667 on 08/25/2022 Lipid Panel: Elevated TGs 258, VLDL 52, HDL 35 on 04/02/2022 HbgA1c: 5.9% on 08/06/2022 EKG on 12/04/2022 showing QTc 454               -- Encouraged patient to participate in unit milieu and in scheduled group therapies              -- Short Term Goals: Ability to identify changes in lifestyle to reduce recurrence of condition will improve and Ability to verbalize feelings will improve             -- Long Term Goals: Improvement in symptoms so as ready for discharge Other PRNS:  Tylenol 650 mg every 6 hours as needed Maalox Mylanta every 4 hours as needed Atarax 25 mg 3 times daily as needed Milk of magnesia daily as needed Trazodone 50 mg nightly as needed Agitation protocol (Haldol, Ativan)   Other labs reviewed on admission:  CMP showing elevated creatinine 1.38, elevated LFTs AST 615, ALT 177 decreased GFR 60 ethanol, salicylate level, acetaminophen level all negative for toxicity CBC showing mild leukocytosis 12.1, otherwise unremarkable.  Repeat CBC showing hemoglobin 11.8, MCV 90.1--normocytic anemia likely secondary to chronic diseases Total CK 40,294 > 18,000                3. Medical Issues Being Addressed:    #Tobacco Use Disorder  Nicotine patch 21mg /24 hours ordered  Smoking cessation encouraged   4. Discharge Planning:              -- Social work and case management to assist with discharge planning and identification of hospital follow-up needs prior to discharge             -- Estimated Discharge Date: Tentatively Monday-Tuesday                         --F/u with LAI clinic                         -- F/u with grief counseling             -- Discharge Concerns: Need to establish a safety plan; Medication compliance and effectiveness             -- Discharge Goals: Return home with outpatient referrals for mental health follow-up including medication  management/psychotherapy   Thalia Party, MD 12/09/2022, 8:02 AM

## 2022-12-09 NOTE — Progress Notes (Signed)
Patient ID: Dylan [name], male   DOB: January 03, 1966, 57 y.o.   MRN: [id number] Phinn reports he is '' getting better'' and reports he slept ''fair '' last night. He continues to report some sinus congestion and sinus head cold but reports good relief with sudafed prn. He denies any SI HI . Con't to encourage po fluids and educated pt regarding importance related to his elevated CK levels. He has been eating and drinking well on the unit. Yostin also reports trouble waking in the am, stating '' I usually take my trazodone in the night at 7pm so that I can get up . '' Discussed with MD and order noted.  Pt is safe, able to make his needs known. Will con't to monitor.

## 2022-12-09 NOTE — Plan of Care (Signed)
  Problem: Coping: Goal: Ability to verbalize frustrations and anger appropriately will improve Outcome: Progressing Goal: Ability to demonstrate self-control will improve Outcome: Progressing   Problem: Activity: Goal: Interest or engagement in activities will improve Outcome: Progressing Goal: Sleeping patterns will improve Outcome: Progressing   Problem: Education: Goal: Knowledge of [facility] Education information/materials will improve Outcome: Progressing Goal: Emotional status will improve Outcome: Progressing Goal: Mental status will improve Outcome: Progressing Goal: Verbalization of understanding the information provided will improve Outcome: Progressing   Problem: Physical Regulation: Goal: Ability to maintain clinical measurements within normal limits will improve Outcome: Progressing

## 2022-12-09 NOTE — Group Note (Signed)
LCSW Group Therapy Note  Group Date: 12/09/2022 Start Time: 1000 End Time: 1100   Type of Therapy and Topic:  Group Therapy - Healthy vs Unhealthy Coping Skills  Participation Level:  Active   Description of Group The focus of this group was to determine what unhealthy coping techniques typically are used by group members and what healthy coping techniques would be helpful in coping with various problems. Patients were guided in becoming aware of the differences between healthy and unhealthy coping techniques. Patients were asked to identify 2-3 healthy coping skills they would like to learn to use more effectively.  Therapeutic Goals Patients learned that coping is what human beings do all day long to deal with various situations in their lives Patients defined and discussed healthy vs unhealthy coping techniques Patients identified their preferred coping techniques and identified whether these were healthy or unhealthy Patients determined 2-3 healthy coping skills they would like to become more familiar with and use more often. Patients provided support and ideas to each other   Summary of Patient Progress:  During group, Dylan [name] expressed that his unhealthy coping skill is being a workaholic and using alcohol, while his healthy coping skill is cooking. Patient proved open to input from peers and feedback from CSW. Patient demonstrated full insight into the subject matter, was respectful of peers, and participated throughout the entire session.   Therapeutic Modalities Cognitive Behavioral Therapy Motivational Interviewing  Dylan [name], Dylan [name] 12/09/2022  11:12 AM

## 2022-12-09 NOTE — Group Note (Unsigned)
Date:  12/10/2022 Time:  12:26 AM  Group Topic/Focus:  Wrap-Up Group:   The focus of this group is to help patients review their daily goal of treatment and discuss progress on daily workbooks.    Participation Level:  Active  Participation Quality:  Appropriate and Sharing  Affect:  Appropriate  Cognitive:  Appropriate  Insight: Appropriate  Engagement in Group:  Engaged  Modes of Intervention:  Activity and Socialization  Additional Comments:  Patient stated that he is going "good" and he had a "great/chill day". Patient stated that he interacted with others on the unit "played basketball". Patient shared how he wants to "get better and do better" when it comes to his addiction. Patient rated his day a 10/10. Patient participated in the group activity.   Dylan [name] 12/10/2022, 12:26 AM

## 2022-12-09 NOTE — Progress Notes (Signed)
Patient ID: Dylan [name], male   DOB: 1965/03/19, 57 y.o.   MRN: [id number] Pt reporting worsening cold symptoms, V/S are WDL , pt afebrile. Pt reports still has sinus pressure and cough. Requesting covid test. MD notified and order received. Pt instructed to remain in room with mask on until covid test resulted. Pt is safe, given fluids. Specimen given to security for transport to [facility].

## 2022-12-09 NOTE — [facility] Group Notes (Signed)
[facility] Group Notes:  (Nursing/MHT/Case Management/Adjunct)  Date:  12/09/2022  Time:  2:36 PM  Type of Therapy:  Psychoeducational Skills  Participation Level:  Did Not Attend  Participation Quality:   na  Affect:    Cognitive:    Insight:    Engagement in Group:    Modes of Intervention:    Summary of Progress/Problems: Psychoeducational group in which patients were educated on the impact of negative thinking, positive reframing and mindfulness. Pt did not attend despite enocuragement.  Malva Limes 12/09/2022, 2:36 PM

## 2022-12-10 DIAGNOSIS — F319 Bipolar disorder, unspecified: Secondary | ICD-10-CM | POA: Diagnosis not present

## 2022-12-10 MED ORDER — ARIPIPRAZOLE ER 400 MG IM SRER: 400.0000 mg | INTRAMUSCULAR | 0 refills | Status: DC

## 2022-12-10 MED ORDER — ARIPIPRAZOLE 15 MG PO TABS
15.0000 mg | ORAL_TABLET | Freq: Every day | ORAL | Status: DC
  Filled 2022-12-10 (×2): qty 1

## 2022-12-10 MED ORDER — ARIPIPRAZOLE ER 400 MG IM SRER
400.0000 mg | Freq: Once | INTRAMUSCULAR | Status: AC
  Administered 2022-12-10: 400 mg via INTRAMUSCULAR

## 2022-12-10 MED ORDER — ARIPIPRAZOLE ER 400 MG IM SRER
400.0000 mg | Freq: Once | INTRAMUSCULAR | Status: DC
  Filled 2022-12-10: qty 2

## 2022-12-10 MED ORDER — TRAZODONE HCL 100 MG PO TABS: 100.0000 mg | ORAL_TABLET | Freq: Every evening | ORAL | 0 refills | Status: DC | PRN

## 2022-12-10 MED ORDER — ARIPIPRAZOLE 15 MG PO TABS: 15.0000 mg | ORAL_TABLET | Freq: Every day | ORAL | 0 refills | Status: DC

## 2022-12-10 MED ORDER — ARIPIPRAZOLE ER 400 MG IM SRER
400.0000 mg | INTRAMUSCULAR | Status: DC
Start: 2023-01-08 — End: 2022-12-10

## 2022-12-10 NOTE — Progress Notes (Signed)
  [facility] Adult Case Management Discharge Plan :  Will you be returning to the same living situation after discharge:  Yes,  has housing At discharge, do you have transportation home?: Yes,  CSW to provide transportation Do you have the ability to pay for your medications: Yes,  has insurance  Release of information consent forms completed and in the chart;  Patient's signature needed at discharge.  Patient to Follow up at:  Follow-up Information     Services, [facility]. Go on 12/13/2022.   Why: You have a hospital follow up appointment for group therapy and medication management services on 12/13/22 at 11:00 am.  The appointment will be held in person. Contact information: [address] [place] [address] [phone]                 Next level of care provider has access to [facility] Link:no  Safety Planning and Suicide Prevention discussed: No. Patient refused consents     Has patient been referred to the Quitline?: Patient refused referral for treatment  Patient has been referred for addiction treatment: Patient refused referral for treatment.  Marinda Elk, LCSW 12/10/2022, 10:58 AM

## 2022-12-10 NOTE — Progress Notes (Signed)
Pt c/o congestion ans cough PRN medication provided.

## 2022-12-10 NOTE — Progress Notes (Signed)
Pt discharged to lobby with taxi voucher. Pt was stable and appreciative at that time. All papers and electronic prescriptions were given and valuables returned. Suicide safety plan completed. Verbal understanding expressed. Denies SI/HI and A/VH. Pt given opportunity to express concerns and ask questions.

## 2022-12-10 NOTE — Progress Notes (Signed)
D) Pt received calm, visible, participating in milieu, and in no acute distress. Pt A & O x4. Pt denies SI, HI, A/ V H, depression, anxiety and pain at this time. A) Pt encouraged to drink fluids. Pt encouraged to come to staff with needs. Pt encouraged to attend and participate in groups. Pt encouraged to set reachable goals.  R) Pt remained safe on unit, in no acute distress, will continue to assess.     12/09/22 2100  Psych Admission Type (Psych Patients Only)  Admission Status Involuntary  Psychosocial Assessment  Patient Complaints Anxiety  Eye Contact Fair  Facial Expression Animated  Affect Other (Comment) (happy)  Speech Logical/coherent  Interaction Assertive  Motor Activity Slow  Appearance/Hygiene Disheveled  Behavior Characteristics Cooperative  Mood Anxious  Thought Process  Coherency WDL  Content WDL  Delusions None reported or observed  Perception WDL  Hallucination None reported or observed  Judgment WDL  Confusion None  Danger to Self  Current suicidal ideation? Denies  Danger to Others  Danger to Others None reported or observed

## 2022-12-10 NOTE — Progress Notes (Signed)
   12/10/22 1000  Psych Admission Type (Psych Patients Only)  Admission Status Involuntary  Psychosocial Assessment  Patient Complaints None  Eye Contact Fair  Facial Expression Animated  Affect Appropriate to circumstance  Speech Logical/coherent  Interaction Assertive  Motor Activity Slow  Appearance/Hygiene Unremarkable  Behavior Characteristics Cooperative  Mood Anxious;Euthymic  Aggressive Behavior  Effect No apparent injury  Thought Process  Coherency WDL  Content WDL  Delusions None reported or observed  Perception WDL  Hallucination None reported or observed  Judgment WDL  Confusion None  Danger to Self  Current suicidal ideation? Denies  Danger to Others  Danger to Others None reported or observed

## 2022-12-10 NOTE — Progress Notes (Signed)
[facility] MD Progress Note  12/10/2022 8:04 AM Dylan [name]  MRN:  [id number]  Principal Problem: <principal problem not specified> Diagnosis: Active Problems:   Bipolar 1 disorder, depressed (HCC)  57 y.o., male with PPH of bipolar 1 d/o, PTSD, alcohol use d/o, stimulant use d/o (meth, cocaine), tobacco use d/o, suicide attempt x4, multiple inpatient psych admission (last time August 2024 [facility]), who presented on 11/25 to [facility] following de-escalation of suicide attempt where patient was found about to jump off a parking garage, was medically admitted for nontraumatic rhabdomyolysis and later admitted to [facility] on 12/05/2022 for crisis stabilization and management of acute on chronic psychiatric symptoms.  Past medical history significant for HIV and hepatitis B.    Interval History Patient was seen today for re-evaluation.  Nursing reports no events overnight, besides the patient reported worsened cough and got COVID test that was negative. Patient is afebrile. The patient has no issues with performing ADLs.  Patient has been medication compliant.    Patient was seen and interviewed by attending psychiatrist. Chart reviewed. Patient discussed during treatment team rounds.  Subjective:  On assessment patient reports "my sinuses is a little worse; feeling good mentally, I am ready for this shot today and ready to go home later today". He reports good mood, denies feeling depressed, anxious. He reports good sleep and appetite. He tolerates oral Abilify well and is agreeable to get an injection today. He denies any symptoms of psychosis - denies auditory or visual hallucinations, denies feeling paranoid, unsafe, does not express any delusions. He denies thoughts or plans of hurting self or others. He denies any physical complaints. He is asking for discharge later today if feels good after the injection. We discussed that we will need to observe him for a few hours after the injection and if he feel good, we can  consider discharge.  Labs: no new results for review.     Total Time spent with patient: 20 minutes  Past Psychiatric History: see H&P  Past Medical History:  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   Intentional drug overdose (HCC) 11/26/2021   Neuromuscular disorder (HCC)    neuropathy   Neuropathy    Pre-diabetes    Rhabdomyolysis 02/19/2017   Unilateral primary osteoarthritis, left knee 12/14/2021    Past Surgical History:  Procedure Laterality Date   FOOT ARTHRODESIS Right 09/17/2019   Procedure: FUSION RIGHT LISFRANC JOINT;  Surgeon: Nadara Mustard, MD;  Location: [facility] OR;  Service: Orthopedics;  Laterality: Right;   FOOT ARTHRODESIS Right 09/2019   HEMORROIDECTOMY     IR FLUORO GUIDE CV LINE RIGHT  02/22/2017   IR US GUIDE VASC ACCESS RIGHT  02/22/2017   TOTAL KNEE ARTHROPLASTY Left 12/14/2021   Procedure: LEFT TOTAL KNEE ARTHROPLASTY;  Surgeon: Nadara Mustard, MD;  Location: [facility] OR;  Service: Orthopedics;  Laterality: Left;   TUMOR REMOVAL     From Chest   Family History:  Family History  Problem Relation Age of Onset   CAD Mother    Diabetes Father    Stroke Father    Colon cancer Maternal Aunt 16   Diabetes Maternal  Aunt    Heart disease Maternal Uncle    Heart attack Maternal Uncle    Esophageal cancer Neg Hx    Rectal cancer Neg Hx    Stomach cancer Neg Hx    Family Psychiatric  History: see H&P  Social History:  Social History   Substance and Sexual Activity  Alcohol Use Yes   Comment: last drink March 2024     Social History   Substance and Sexual Activity  Drug Use Not Currently   Types: Cocaine, Marijuana   Comment: Last used cocaine March 2024; last used cannabis 2022     Social History   Socioeconomic History   Marital status: Married    Spouse name: Not on file   Number of children: 1   Years of education: Not on file   Highest education level: Not on file  Occupational History   Not on file  Tobacco Use   Smoking status: Former    Current packs/day: 0.50    Average packs/day: 0.5 packs/day for 25.0 years (12.5 ttl pk-yrs)    Types: E-cigarettes, Cigarettes   Smokeless tobacco: Never   Tobacco comments:    Vapes daily  Vaping Use   Vaping status: Every Day   Start date: 12/09/2020   Substances: Nicotine  Substance and Sexual Activity   Alcohol use: Yes    Comment: last drink March 2024   Drug use: Not Currently    Types: Cocaine, Marijuana    Comment: Last used cocaine March 2024; last used cannabis 2022   Sexual activity: Yes    Partners: Female  Other Topics Concern   Not on file  Social History Narrative   Lives home alone. 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   Social Determinants of Health   Financial Resource Strain: Not on file  Food Insecurity: No Food Insecurity (12/05/2022)   Hunger Vital Sign    Worried About Running Out of Food in the Last Year: Never true    Ran Out of Food in the Last Year: Never true  Recent Concern: Food Insecurity - Food Insecurity Present (12/04/2022)   Hunger Vital Sign    Worried About Running Out of Food in the Last Year: Sometimes true    Ran Out of Food in the Last Year: Never true  Transportation Needs: No Transportation Needs (12/05/2022)   PRAPARE - Administrator, Civil Service (Medical): No    Lack of Transportation (Non-Medical): No  Recent Concern: Transportation Needs - Unmet Transportation Needs (12/04/2022)   PRAPARE - Administrator, Civil Service (Medical): Yes    Lack of Transportation (Non-Medical): Yes  Physical Activity: Not on file  Stress: Not on file  Social Connections: Not on file   Additional Social History:                          Sleep: Fair  Appetite:  Good  Current Medications: Current Facility-Administered Medications  Medication Dose Route Frequency Provider Last Rate Last Admin   acetaminophen (TYLENOL) tablet 650 mg  650 mg Oral Q6H PRN Eligha Bridegroom, NP   650 mg at 12/09/22 1104   alum & mag hydroxide-simeth (MAALOX/MYLANTA) 200-200-20 MG/5ML suspension 30 mL  30 mL Oral Q4H PRN Eligha Bridegroom, NP   30 mL at 12/07/22 2050   ARIPiprazole ER (ABILIFY MAINTENA) injection 400 mg  400 mg Intramuscular Once Phineas Inches, MD       bictegravir-emtricitabine-tenofovir AF (BIKTARVY) 16-109-60  MG per tablet 1 tablet  1 tablet Oral Daily Eligha Bridegroom, NP   1 tablet at 12/10/22 0745   gabapentin (NEURONTIN) capsule 300 mg  300 mg Oral TID Eligha Bridegroom, NP   300 mg at 12/10/22 0745   guaiFENesin (ROBITUSSIN) 100 MG/5ML liquid 5 mL  5 mL Oral Q4H PRN Thalia Party, MD   5 mL at 12/10/22 0428   haloperidol (HALDOL) tablet 5 mg  5 mg Oral Q6H PRN Eligha Bridegroom, NP       Or   haloperidol lactate (HALDOL) injection 5 mg  5 mg Intramuscular Q6H PRN Eligha Bridegroom, NP       hydrOXYzine (ATARAX) tablet 25 mg  25 mg Oral TID PRN Eligha Bridegroom, NP   25 mg at 12/08/22 2136   influenza vaccine adjuvanted (FLUAD) injection 0.5 mL  0.5 mL Intramuscular Once Abbott Pao, Nadir, MD       loratadine (CLARITIN) tablet 10 mg  10 mg Oral Daily PRN Lorri Frederick, MD   10 mg at 12/09/22 0737   LORazepam (ATIVAN) tablet 2 mg  2 mg Oral Q6H PRN Eligha Bridegroom, NP       Or   LORazepam (ATIVAN) injection 2 mg  2 mg Intramuscular Q6H PRN Eligha Bridegroom, NP       magnesium hydroxide (MILK OF MAGNESIA) suspension 30 mL  30 mL Oral Daily PRN Eligha Bridegroom, NP       menthol-cetylpyridinium (CEPACOL) lozenge 3 mg  1 lozenge Oral PRN Onuoha, Chinwendu V, NP   3 mg at 12/08/22 2208   nicotine (NICODERM CQ - dosed in mg/24 hours) patch 14 mg  14 mg Transdermal Daily Carrion-Carrero, Margely, MD   14 mg at 12/10/22  0745   pseudoephedrine (SUDAFED) tablet 30 mg  30 mg Oral Q8H PRN Carrion-Carrero, Karle Starch, MD   30 mg at 12/10/22 0749   sodium chloride (OCEAN) 0.65 % nasal spray 1 spray  1 spray Each Nare PRN Onuoha, Chinwendu V, NP   1 spray at 12/09/22 2211   traZODone (DESYREL) tablet 100 mg  100 mg Oral QHS PRN Thalia Party, MD   100 mg at 12/09/22 2038    Lab Results:  Results for orders placed or performed during the hospital encounter of 12/05/22 (from the past 48 hour(s))  SARS Coronavirus 2 by RT PCR (hospital order, performed in [facility] hospital lab) *cepheid single result test* Anterior Nasal Swab     Status: None   Collection Time: 12/09/22  6:25 PM   Specimen: Anterior Nasal Swab  Result Value Ref Range   SARS Coronavirus 2 by RT PCR NEGATIVE NEGATIVE    Comment: (NOTE) SARS-CoV-2 target nucleic acids are NOT DETECTED.  The SARS-CoV-2 RNA is generally detectable in upper and lower respiratory specimens during the acute phase of infection. The lowest concentration of SARS-CoV-2 viral copies this assay can detect is 250 copies / mL. A negative result does not preclude SARS-CoV-2 infection and should not be used as the sole basis for treatment or other patient management decisions.  A negative result may occur with improper specimen collection / handling, submission of specimen other than nasopharyngeal swab, presence of viral mutation(s) within the areas targeted by this assay, and inadequate number of viral copies (<250 copies / mL). A negative result must be combined with clinical observations, patient history, and epidemiological information.  Fact Sheet for Patients:   RoadLapTop.co.za  Fact Sheet for Healthcare Providers: http://kim-miller.com/  This test is not yet approved or  cleared  by the [place] and has been authorized for detection and/or diagnosis of SARS-CoV-2 by FDA under an Emergency Use Authorization (EUA).   This EUA will remain in effect (meaning this test can be used) for the duration of the COVID-19 declaration under Section 564(b)(1) of the Act, 21 U.S.C. section 360bbb-3(b)(1), unless the authorization is terminated or revoked sooner.  Performed at [facility], [address], [place], [place] [address]     Blood Alcohol level:  Lab Results  Component Value Date   [facility] <10 12/04/2022   ETH <10 09/11/2022    Metabolic Disorder Labs: Lab Results  Component Value Date   HGBA1C 5.9 (H) 12/08/2022   MPG 122.63 12/08/2022   MPG 131 04/03/2022   Lab Results  Component Value Date   PROLACTIN 2.6 (L) 08/25/2022   PROLACTIN 4.8 03/30/2022   Lab Results  Component Value Date   CHOL 245 (H) 12/08/2022   TRIG 251 (H) 12/08/2022   HDL 44 12/08/2022   CHOLHDL 5.6 12/08/2022   VLDL 50 (H) 12/08/2022   LDLCALC 151 (H) 12/08/2022   LDLCALC 166 (H) 09/11/2022    Physical Findings: AIMS:  , ,  ,  ,    CIWA:    COWS:     Musculoskeletal: Strength & Muscle Tone: within normal limits Gait & Station: normal Patient leans: N/A  Psychiatric Specialty Exam:  Appearance:  AAM, appearing stated age,  wearing appropriate to the situation casual/hospital clothes, with fair grooming and hygiene. Normal level of alertness and appropriate facial expression.  Attitude/Behavior: calm, cooperative, engaging with appropriate eye contact.  Motor: WNL; dyskinesias not evident. Gait appears in full range.  Speech: spontaneous, clear, coherent, normal comprehension.  Mood: euthymic, "good".  Affect: appropriately-reactive, restricted.  Thought process: patient appears coherent, organized, logical, goal-directed.  Thought content: patient denies suicidal thoughts, denies homicidal thoughts; did not express any delusions.  Thought perception: patient denies auditory and visual hallucinations. Did not appear internally stimulated.  Cognition: patient is alert and  oriented in self, place, date; with intact attention and concentration.  Insight: fair, in regards of understanding of presence, nature, cause, and significance of mental or emotional problem.  Judgement: fair, in regards of ability to make good decisions concerning the appropriate thing to do in various situations, including ability to form opinions regarding their mental health condition.  Assets  Assets: Manufacturing systems engineer; Desire for Improvement; Resilience   Sleep  Sleep:No data recorded   Physical Exam: Physical Exam ROS Blood pressure 122/75, pulse 81, temperature 98.2 F (36.8 C), temperature source Oral, resp. rate 16, height 6\' 2"  (1.88 m), weight 105.6 kg, SpO2 99%. Body mass index is 29.89 kg/m.   Treatment Plan Summary: Daily contact with patient to assess and evaluate symptoms and progress in treatment and Medication management  ASSESSMENT: Dylan [name] is a 57 y.o., male with PPH of bipolar 1 d/o, PTSD, alcohol use d/o, stimulant use d/o (meth, cocaine), tobacco use d/o, suicide attempt x4, multiple inpatient psych admission (last time August 2024 [facility]), who presented on 11/25 to [facility] following de-escalation of suicide attempt where patient was found about to jump off a parking garage, was medically admitted for nontraumatic rhabdomyolysis and later admitted to [facility] on 12/05/2022 for crisis stabilization and management of acute on chronic psychiatric symptoms.  Past medical history significant for HIV and hepatitis B.   Mood continues to improve. Titrating oral Abilify, with plans to transition to [employer].      Diagnoses /  Active Problems: Bipolar 1 disorder, current episode depressed Alcohol use disorder, active Tobacco use disorder, active Cannabis use disorder active Stimulant use disorder     PLAN: Safety and Monitoring:             --  INVOLUNTARY  admission to inpatient psychiatric unit for safety, stabilization and treatment              -- Daily contact with patient to assess and evaluate symptoms and progress in treatment             -- Patient's case to be discussed in multi-disciplinary team meeting             -- Observation Level : q15 minute checks             -- Vital signs:  q12 hours             -- Precautions: suicide, elopement, and assault   2. Psychiatric Diagnoses and Treatment:  D/c  oral Abilify; administer Abilify Maintena LAI 400 mg IM today Discontinued home Seroquel due to ongoing noncompliance  -- The risks/benefits/side-effects/alternatives to this medication were discussed in detail with the patient and time was given for questions. The patient consents to medication trial.              -- Metabolic profile and EKG monitoring obtained while on an atypical antipsychotic  BMI: 29.89 kg/m TSH: 0.667 on 08/25/2022 Lipid Panel: Elevated TGs 258, VLDL 52, HDL 35 on 04/02/2022 HbgA1c: 5.9% on 08/06/2022 EKG on 12/04/2022 showing QTc 454               -- Encouraged patient to participate in unit milieu and in scheduled group therapies              -- Short Term Goals: Ability to identify changes in lifestyle to reduce recurrence of condition will improve and Ability to verbalize feelings will improve             -- Long Term Goals: Improvement in symptoms so as ready for discharge Other PRNS:  Tylenol 650 mg every 6 hours as needed Maalox Mylanta every 4 hours as needed Atarax 25 mg 3 times daily as needed Milk of magnesia daily as needed Trazodone 50 mg nightly as needed Agitation protocol (Haldol, Ativan)   Other labs reviewed on admission:  CMP showing elevated creatinine 1.38, elevated LFTs AST 615, ALT 177 decreased GFR 60 ethanol, salicylate level, acetaminophen level all negative for toxicity CBC showing mild leukocytosis 12.1, otherwise unremarkable.  Repeat CBC showing hemoglobin 11.8, MCV 90.1--normocytic anemia likely secondary to chronic diseases Total CK 40,294 > 18,000                 3. Medical Issues Being Addressed:    #Tobacco Use Disorder  Nicotine patch 21mg /24 hours ordered  Smoking cessation encouraged   4. Discharge Planning:              -- Social work and case management to assist with discharge planning and identification of hospital follow-up needs prior to discharge             -- Estimated Discharge Date: possibly later today - tomorrow.                         --F/u with LAI clinic                         --  F/u with grief counseling             -- Discharge Concerns: Need to establish a safety plan; Medication compliance and effectiveness             -- Discharge Goals: Return home with outpatient referrals for mental health follow-up including medication management/psychotherapy   Thalia Party, MD 12/10/2022, 8:04 AM

## 2022-12-10 NOTE — Discharge Summary (Signed)
Physician Discharge Summary Note  Patient:  Dylan [name] is an 57 y.o., male MRN:  [id number] DOB:  28-Jan-1965 Patient phone:  [phone] (home)  Patient address:   [address] [place] [address],  Total Time spent with patient: 45 minutes  Date of Admission:  12/05/2022 Date of Discharge: 12/10/2022  Reason for Admission:   56 y.o., male with PPH of bipolar 1 d/o, PTSD, alcohol use d/o, stimulant use d/o (meth, cocaine), tobacco use d/o, suicide attempt x4, multiple inpatient psych admission (last time August 2024 [facility]), who presented on 11/25 to [facility] following de-escalation of suicide attempt where patient was found about to jump off a parking garage, was medically admitted for nontraumatic rhabdomyolysis and later admitted to [facility] on 12/05/2022 for crisis stabilization and management of acute on chronic psychiatric symptoms. Past medical history significant for HIV and hepatitis B   Principal Problem: <principal problem not specified> Discharge Diagnoses:  Bipolar 1 disorder, current episode depressed Alcohol use disorder, active Tobacco use disorder, active Cannabis use disorder active Stimulant use disorder    Past Psychiatric History:  Previous Psych Diagnoses: schizoaffective d/o v schizophrenia v bipolar 1 d/o, alcohol use d/o, cocanine use d/o, methamphetamine use d/o, PTSD Prior inpatient treatment: Multiple [facility] 08/30/2021, [name] in March 2023, [facility] on 04/28/2020  [facility] 8/18-8/20,  History of suicide: Multiple, at least 4 times OD, held gun to his head, plan to jump off a parking deck  History of homicide: None Psychiatric medication history: Long history of Psychiatric medication compliance history: Poor Psychotherapy hx: Yes Current Psychiatrist: Floydene Flock (has not seen anyone, had appointment for 11/27) Rx:  Cymbalta discontinued due to elevated LFTs Trazodone, Seroquel, gabapentin  Past Medical History:  Past Medical History:  Diagnosis Date   Anxiety    Arthritis     Bipolar 1 disorder (HCC)    Colon polyps    Depression    GERD (gastroesophageal reflux disease)    Hepatitis B    Human immunodeficiency virus (HIV) (HCC)    Hyperlipidemia    Hypertension    Insomnia due to other mental disorder 02/03/2020   Intentional drug overdose (HCC) 11/26/2021   Neuromuscular disorder (HCC)    neuropathy   Neuropathy    Pre-diabetes    Rhabdomyolysis 02/19/2017   Unilateral primary osteoarthritis, left knee 12/14/2021    Past Surgical History:  Procedure Laterality Date   FOOT ARTHRODESIS Right 09/17/2019   Procedure: FUSION RIGHT LISFRANC JOINT;  Surgeon: Nadara Mustard, MD;  Location: [facility] OR;  Service: Orthopedics;  Laterality: Right;   FOOT ARTHRODESIS Right 09/2019   HEMORROIDECTOMY     IR FLUORO GUIDE CV LINE RIGHT  02/22/2017   IR US GUIDE VASC ACCESS RIGHT  02/22/2017   TOTAL KNEE ARTHROPLASTY Left 12/14/2021   Procedure: LEFT TOTAL KNEE ARTHROPLASTY;  Surgeon: Nadara Mustard, MD;  Location: [facility] OR;  Service: Orthopedics;  Laterality: Left;   TUMOR REMOVAL     From Chest   Family History:  Family History  Problem Relation Age of Onset   CAD Mother    Diabetes Father    Stroke Father    Colon cancer Maternal Aunt January 03, 2059   Diabetes Maternal Aunt    Heart disease Maternal Uncle    Heart attack Maternal Uncle    Esophageal cancer Neg Hx    Rectal cancer Neg Hx    Stomach cancer Neg Hx    Family Psychiatric  History:  Medical: Father died in 01-02-01 due to stroke Psych: Mom has  mental issues -but he does not know diagnosis.  Uncle has?  Schizophrenia.  No suicidal attempt in the family Substance use family hx: Unknown  Social History:  Social History   Substance and Sexual Activity  Alcohol Use Yes   Comment: last drink March 2024     Social History   Substance and Sexual Activity  Drug Use Not Currently   Types: Cocaine, Marijuana   Comment: Last used cocaine March 2024; last used cannabis 2022    Social History   Socioeconomic  History   Marital status: Married    Spouse name: Not on file   Number of children: 1   Years of education: Not on file   Highest education level: Not on file  Occupational History   Not on file  Tobacco Use   Smoking status: Former    Current packs/day: 0.50    Average packs/day: 0.5 packs/day for 25.0 years (12.5 ttl pk-yrs)    Types: E-cigarettes, Cigarettes   Smokeless tobacco: Never   Tobacco comments:    Vapes daily  Vaping Use   Vaping status: Every Day   Start date: 12/09/2020   Substances: Nicotine  Substance and Sexual Activity   Alcohol use: Yes    Comment: last drink March 2024   Drug use: Not Currently    Types: Cocaine, Marijuana    Comment: Last used cocaine March 2024; last used cannabis 2022   Sexual activity: Yes    Partners: Female  Other Topics Concern   Not on file  Social History Narrative   Lives home alone. 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   Social Determinants of Health   Financial Resource Strain: Not on file  Food Insecurity: No Food Insecurity (12/05/2022)   Hunger Vital Sign    Worried About Running Out of Food in the Last Year: Never true    Ran Out of Food in the Last Year: Never true  Recent Concern: Food Insecurity - Food Insecurity Present (12/04/2022)   Hunger Vital Sign    Worried About Running Out of Food in the Last Year: Sometimes true    Ran Out of Food in the Last Year: Never true  Transportation Needs: No Transportation Needs (12/05/2022)   PRAPARE - Administrator, Civil Service (Medical): No    Lack of Transportation (Non-Medical): No  Recent Concern: Transportation Needs - Unmet Transportation Needs (12/04/2022)   PRAPARE - Administrator, Civil Service (Medical): Yes    Lack of Transportation (Non-Medical): Yes  Physical Activity: Not on file  Stress: Not on file  Social Connections: Not on file    Hospital Course:   The patient was admitted to the inpatient psychiatric unit due to  symptoms of depression, suicidal ideation, and unstable mood. He was restricted to ward and placed on suicide precautions. Patient was introduced to milieu activities and encouraged to participate in psycho-social groups. The plan at the time of admission was safety, stabilization and treatment.  Patient was treated with a combination of medication, therapy, and some educational activities during her hospital stay. She participated in individual and group therapy sessions, and attended various educational groups and participated in activities to help her manage her emotional and mental health. Given patient's history of medication noncompliance, we have chosen to transition him to Abilify,, with the idea of providing mood stabilizing effects and transitioning to LAI for better compliance. His alcohol use and cannabis use disorders continue to be active, and likely exacerbate his  psychiatric conditions. Home medication Seroquel was discontinued and oral Abilify was started initially at 5mg  daily, then titrated to 10 and 15 mg respectively. On 12/10/2022 patient received his Abilify Maintenna 400mg  long-acting injection with a plan to continue the medication every 28 days. Patient never required as needed medications for agitation or psychosis.  At the time of discharge, patient was able to manage his symptoms of depression, anxiety, and suicidal ideation. She was able to identify his triggers and develop coping skills to help her manage her emotions. He was also able to develop positive relationships with staff and peers on the unit. Patient is being discharged with a follow-up appointment/referral to an outpatient mental health provider. Patient also has access to a 24-hour crisis hotline and other community resources should she need additional support. Patient has been instructed to take his medications as prescribed, continue participating in therapy, and practice healthy lifestyle habits such as getting enough  sleep and exercise. He was also reminded to reach out to his support system and the community resources available to him.  Discharge instructions:  Activity: as tolerated  Diet: heart healthy  # It is recommended to the patient to continue psychiatric medications as prescribed, after discharge from the hospital.     # It is recommended to the patient to follow up with your outpatient psychiatric provider -instructions on appointment date, time, and address (location) are provided to you in discharge paperwork  # Follow-up with outpatient primary care doctor and other specialists -for management of chronic medical disease, including: HIV, Hep C, sinus infection.   # It was discussed with the patient, the impact of alcohol, drugs, tobacco have been there overall psychiatric and medical wellbeing, and total abstinence from substance use was recommended to the patient.   # Prescriptions provided or sent directly to preferred pharmacy at discharge. Patient agreeable to plan. Given opportunity to ask questions. Appears to feel comfortable with discharge.    # In the event of worsening symptoms, the patient is instructed to call the crisis hotline, [phone], and or go to the nearest ED for appropriate evaluation and treatment of symptoms. To follow-up with primary care provider for other medical issues, concerns and or health care needs.  Physical Findings: AIMS:  , ,  ,  ,    CIWA:    COWS:     Musculoskeletal: Strength & Muscle Tone: within normal limits Gait & Station: normal Patient leans:  n/a   Psychiatric Specialty Exam:  Appearance:  AAM, appearing stated age,  wearing appropriate to the situation casual/hospital clothes, with fair grooming and hygiene. Normal level of alertness and appropriate facial expression.   Attitude/Behavior: calm, cooperative, engaging with appropriate eye contact.   Motor: WNL; dyskinesias not evident. Gait appears in full range.   Speech:  spontaneous, clear, coherent, normal comprehension.   Mood: euthymic, "good".   Affect: appropriately-reactive, restricted.   Thought process: patient appears coherent, organized, logical, goal-directed.   Thought content: patient denies suicidal thoughts, denies homicidal thoughts; did not express any delusions.   Thought perception: patient denies auditory and visual hallucinations. Did not appear internally stimulated.   Cognition: patient is alert and oriented in self, place, date; with intact attention and concentration.   Insight: fair, in regards of understanding of presence, nature, cause, and significance of mental or emotional problem.   Judgement: fair, in regards of ability to make good decisions concerning the appropriate thing to do in various situations, including ability to form opinions regarding their mental  health condition.  Physical Exam: Physical Exam ROS Blood pressure 131/68, pulse 82, temperature 97.8 F (36.6 C), resp. rate 16, height 6\' 2"  (1.88 m), weight 105.6 kg, SpO2 100%. Body mass index is 29.89 kg/m.   Social History   Tobacco Use  Smoking Status Former   Current packs/day: 0.50   Average packs/day: 0.5 packs/day for 25.0 years (12.5 ttl pk-yrs)   Types: E-cigarettes, Cigarettes  Smokeless Tobacco Never  Tobacco Comments   Vapes daily   Tobacco Cessation:  A prescription for an FDA-approved tobacco cessation medication was offered at discharge and the patient refused   Blood Alcohol level:  Lab Results  Component Value Date   [facility] <10 12/04/2022   ETH <10 09/11/2022    Metabolic Disorder Labs:  Lab Results  Component Value Date   HGBA1C 5.9 (H) 12/08/2022   MPG 122.63 12/08/2022   MPG 131 04/03/2022   Lab Results  Component Value Date   PROLACTIN 2.6 (L) 08/25/2022   PROLACTIN 4.8 03/30/2022   Lab Results  Component Value Date   CHOL 245 (H) 12/08/2022   TRIG 251 (H) 12/08/2022   HDL 44 12/08/2022   CHOLHDL 5.6  12/08/2022   VLDL 50 (H) 12/08/2022   LDLCALC 151 (H) 12/08/2022   LDLCALC 166 (H) 09/11/2022    See Psychiatric Specialty Exam and Suicide Risk Assessment completed by Attending Physician prior to discharge.  Discharge destination:  Home  Is patient on multiple antipsychotic therapies at discharge:  No   Has Patient had three or more failed trials of antipsychotic monotherapy by history:  No  Recommended Plan for Multiple Antipsychotic Therapies: NA   Allergies as of 12/10/2022       Reactions   Penicillins Other (See Comments)   Reaction type/severity unknown, childhood allergy   Pork-derived Products Other (See Comments)   Patient preference   Latex Rash   Tape Rash        Medication List     STOP taking these medications    aspirin EC 81 MG tablet   meloxicam 7.5 MG tablet Commonly known as: MOBIC   omeprazole 40 MG capsule Commonly known as: PRILOSEC   tiZANidine 4 MG tablet Commonly known as: Zanaflex       TAKE these medications      Indication  ARIPiprazole 15 MG tablet Commonly known as: ABILIFY Take 1 tablet (15 mg total) by mouth daily. Start taking on: December 11, 2022  Indication: MIXED BIPOLAR AFFECTIVE DISORDER   ARIPiprazole ER 400 MG Srer injection Commonly known as: ABILIFY MAINTENA Inject 2 mLs (400 mg total) into the muscle every 28 (twenty-eight) days. Start taking on: January 08, 2023  Indication: Manic-Depression   Biktarvy 50-200-25 MG Tabs tablet Generic drug: bictegravir-emtricitabine-tenofovir AF Take 1 tablet by mouth daily.  Indication: HIV Disease   gabapentin 300 MG capsule Commonly known as: NEURONTIN Take 1 capsule (300 mg total) by mouth 3 (three) times daily.  Indication: Peripheral Nerve Disease   traZODone 100 MG tablet Commonly known as: DESYREL Take 1 tablet (100 mg total) by mouth at bedtime as needed for sleep.  Indication: Trouble Sleeping        Follow-up Information     [facility], [facility]. Go on 12/13/2022.   Why: You have a hospital follow up appointment for group therapy and medication management services on 12/13/22 at 11:00 am.  The appointment will be held in person. Contact information: [address] [place] [address] [phone]  Follow-up recommendations:  Other:  see above  Comments:  Emergency Contact Plan: Patient understands to call Suicide Hotline at 63, or Mobile Crisis at [phone], call 911, or go to the nearest ER as appropriate in case of thoughts of harm to self or others, or acute onset of intolerable side effects.  Signed: Thalia Party, MD 12/10/2022, 10:01 AM

## 2022-12-10 NOTE — [facility] Suicide Risk Assessment (Signed)
[facility] Discharge Suicide Risk Assessment   Principal Problem: <principal problem not specified> Discharge Diagnoses: Active Problems:   Bipolar 1 disorder, depressed (HCC)   Total Time spent with patient: 45 minutes  Musculoskeletal: Strength & Muscle Tone: within normal limits Gait & Station: normal Patient leans: N/A  Sleep  Sleep:No data recorded  Physical Exam: Physical Exam ROS Blood pressure 131/68, pulse 82, temperature 97.8 F (36.6 C), resp. rate 16, height 6\' 2"  (1.88 m), weight 105.6 kg, SpO2 100%. Body mass index is 29.89 kg/m.  Mental Status:   Appearance:  AAM, appearing stated age,  wearing appropriate to the situation casual/hospital clothes, with fair grooming and hygiene. Normal level of alertness and appropriate facial expression.   Attitude/Behavior: calm, cooperative, engaging with appropriate eye contact.   Motor: WNL; dyskinesias not evident. Gait appears in full range.   Speech: spontaneous, clear, coherent, normal comprehension.   Mood: euthymic, "good".   Affect: appropriately-reactive, restricted.   Thought process: patient appears coherent, organized, logical, goal-directed.   Thought content: patient denies suicidal thoughts, denies homicidal thoughts; did not express any delusions.   Thought perception: patient denies auditory and visual hallucinations. Did not appear internally stimulated.   Cognition: patient is alert and oriented in self, place, date; with intact attention and concentration.   Insight: fair, in regards of understanding of presence, nature, cause, and significance of mental or emotional problem.   Judgement: fair, in regards of ability to make good decisions concerning the appropriate thing to do in various situations, including ability to form opinions regarding their mental health condition.  Suicide Risk:  Minimal: No identifiable suicidal ideation.  Patients presenting with no risk factors but with morbid  ruminations; may be classified as minimal risk based on the severity of the depressive symptoms  Suicide Risk Assessment: prior suicidal attempts, male gender - represent non-modifiable/baseline risk factors. Presence of mental disorder, substance use, debilitating medical illness are dynamic risk factors. The patient denies suicidal thoughts, denies access to firearm. Patient is future-oriented, help-seeking, has access to mental health care, has family and community support  - all protective factors. Therefore, represents a low risk for harming self acutely and elevated chronic risk due to non-modifiable risk factors.      [place] Suicide Severity Rating Scale 13-Dec-2022:  Wish to be dead: No  Suicidal thoughts: No                  Suicidal thoughts with method: No                  Suicidal intent: No                  Suicide intent with specific plan: No                  Suicide behavior: No    Follow-up Information     [facility], [facility]. Go on 12/13/2022.   Why: You have a hospital follow up appointment for group therapy and medication management services on 12/13/22 at 11:00 am.  The appointment will be held in person. Contact information: [address] [place] [address] [phone]                 Plan Of Care/Follow-up recommendations:  Emergency Contact Plan: Patient understands to call Suicide Hotline at 47, or Mobile Crisis at [phone], call 911, or go to the nearest ER as appropriate in case of thoughts of harm to self or others, or acute onset  of intolerable side effects.  Thalia Party, MD 12/10/2022, 10:11 AM

## 2022-12-10 NOTE — [facility] Group Notes (Signed)
[facility] Group Notes:  (Nursing/MHT/Case Management/Adjunct)  Date:  12/10/2022  Time:  8:49 AM  Type of Therapy:   Goals group  Participation Level:  Active  Participation Quality:  Appropriate  Affect:  Appropriate  Cognitive:  Appropriate  Insight:  Improving  Engagement in Group:  Improving  Modes of Intervention:  Discussion and Orientation  Summary of Progress/Problems: Goal is to be better with communication  Azalee Course 12/10/2022, 8:49 AM

## 2022-12-16 ENCOUNTER — Emergency Department (HOSPITAL_COMMUNITY)
Admission: EM | Admit: 2022-12-16 | Discharge: 2022-12-16 | Disposition: A | Discharge status: Home / Self Care | Payer: MEDICAID | Attending: Emergency Medicine | Admitting: Emergency Medicine

## 2022-12-16 ENCOUNTER — Emergency Department (HOSPITAL_COMMUNITY): Payer: MEDICAID

## 2022-12-16 ENCOUNTER — Other Ambulatory Visit: Payer: Self-pay

## 2022-12-16 DIAGNOSIS — M79661 Pain in right lower leg: Secondary | ICD-10-CM | POA: Diagnosis present

## 2022-12-16 DIAGNOSIS — Z79899 Other long term (current) drug therapy: Secondary | ICD-10-CM | POA: Insufficient documentation

## 2022-12-16 DIAGNOSIS — Z91040 Latex allergy status: Secondary | ICD-10-CM | POA: Insufficient documentation

## 2022-12-16 DIAGNOSIS — D72829 Elevated white blood cell count, unspecified: Secondary | ICD-10-CM | POA: Insufficient documentation

## 2022-12-16 DIAGNOSIS — I10 Essential (primary) hypertension: Secondary | ICD-10-CM | POA: Diagnosis not present

## 2022-12-16 DIAGNOSIS — M6282 Rhabdomyolysis: Secondary | ICD-10-CM | POA: Insufficient documentation

## 2022-12-16 LAB — CBC WITH DIFFERENTIAL/PLATELET
Abs Immature Granulocytes: 0.03 10*3/uL (ref 0.00–0.07)
Basophils Absolute: 0 10*3/uL (ref 0.0–0.1)
Basophils Relative: 0 %
Eosinophils Absolute: 0 10*3/uL (ref 0.0–0.5)
Eosinophils Relative: 0 %
HCT: 47 % (ref 39.0–52.0)
Hemoglobin: 15.9 g/dL (ref 13.0–17.0)
Immature Granulocytes: 0 %
Lymphocytes Relative: 17 %
Lymphs Abs: 1.9 10*3/uL (ref 0.7–4.0)
MCH: 30.6 pg (ref 26.0–34.0)
MCHC: 33.8 g/dL (ref 30.0–36.0)
MCV: 90.4 fL (ref 80.0–100.0)
Monocytes Absolute: 0.7 10*3/uL (ref 0.1–1.0)
Monocytes Relative: 6 %
Neutro Abs: 8.4 10*3/uL — ABNORMAL HIGH (ref 1.7–7.7)
Neutrophils Relative %: 77 %
Platelets: 228 10*3/uL (ref 150–400)
RBC: 5.2 MIL/uL (ref 4.22–5.81)
RDW: 12.8 % (ref 11.5–15.5)
WBC: 11.1 10*3/uL — ABNORMAL HIGH (ref 4.0–10.5)
nRBC: 0 % (ref 0.0–0.2)

## 2022-12-16 LAB — COMPREHENSIVE METABOLIC PANEL
ALT: 58 U/L — ABNORMAL HIGH (ref 0–44)
AST: 45 U/L — ABNORMAL HIGH (ref 15–41)
Albumin: 4.4 g/dL (ref 3.5–5.0)
Alkaline Phosphatase: 96 U/L (ref 38–126)
Anion gap: 13 (ref 5–15)
BUN: 16 mg/dL (ref 6–20)
CO2: 21 mmol/L — ABNORMAL LOW (ref 22–32)
Calcium: 9.9 mg/dL (ref 8.9–10.3)
Chloride: 101 mmol/L (ref 98–111)
Creatinine, Ser: 1.06 mg/dL (ref 0.61–1.24)
GFR, Estimated: >60 mL/min (ref >60)
Glucose, Bld: 104 mg/dL — ABNORMAL HIGH (ref 70–99)
Potassium: 3.9 mmol/L (ref 3.5–5.1)
Sodium: 135 mmol/L (ref 135–145)
Total Bilirubin: 0.9 mg/dL (ref <1.2)
Total Protein: 8.4 g/dL — ABNORMAL HIGH (ref 6.5–8.1)

## 2022-12-16 LAB — RAPID URINE DRUG SCREEN, HOSP PERFORMED
Amphetamines: NOT DETECTED
Barbiturates: NOT DETECTED
Benzodiazepines: NOT DETECTED
Cocaine: POSITIVE — AB
Opiates: NOT DETECTED
Tetrahydrocannabinol: NOT DETECTED

## 2022-12-16 LAB — CK: Total CK: 1576 U/L — ABNORMAL HIGH (ref 49–397)

## 2022-12-16 MED ORDER — SODIUM CHLORIDE 0.9 % IV BOLUS: 1000.0000 mL | Freq: Once | INTRAVENOUS | Status: DC

## 2022-12-16 MED ORDER — ACETAMINOPHEN 500 MG PO TABS
1000.0000 mg | ORAL_TABLET | Freq: Once | ORAL | Status: AC
  Administered 2022-12-16: 1000 mg via ORAL
  Filled 2022-12-16: qty 2

## 2022-12-16 MED ORDER — SODIUM CHLORIDE 0.9 % IV BOLUS
1000.0000 mL | Freq: Once | INTRAVENOUS | Status: AC
  Administered 2022-12-16: 1000 mL via INTRAVENOUS

## 2022-12-16 NOTE — ED Notes (Signed)
Pt has hx of SI but does not endorse any at this time.

## 2022-12-16 NOTE — Discharge Instructions (Addendum)
It was a pleasure taking care of you this morning.  You were evaluated in the emergency room for dark urine and bilateral leg pain in addition to productive cough.  Your blood work was consistent with rhabdomyolysis.  As discussed this is muscle breakdown that can often be a result from drug use.  Please be sure to consume plenty of fluids and I would additionally recommend avoiding drug use to prevent recurrence.  An x-ray was additionally performed of your chest which did not show an obvious pneumonia, however as discussed the official radiology read is still pending.  Please sign onto your MyChart by the end of the day to view your findings. Please follow up with your PCP in the next five days to ensure you are progressing well. If you experience any new or worsening symptoms including difficulty urinating, worsening cough, shortness of breath or fevers please return to the emergency room.

## 2022-12-16 NOTE — ED Provider Notes (Signed)
[place] EMERGENCY DEPARTMENT AT [facility] Provider Note   CSN: [id number] Arrival date & time: 12/16/22  1308     History  Chief Complaint  Patient presents with   LEG CRAMP   DARK URINE    Dylan [name] is a 57 y.o. male who presents with 1 day onset of bilateral leg cramping and dark urine.  He reports history of rhabdomyolysis and describes these symptoms as similar.  He does endorse recent drug use in the past 2 days.  Patient also notes that he has been congested with a productive cough with green sputum for the last 4 days.  Symptoms are persistent.  No fevers, no abdominal pain vomiting or diarrhea.  No chest pain or shortness of breath.  HPI    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   Intentional drug overdose (HCC) 11/26/2021   Neuromuscular disorder (HCC)    neuropathy   Neuropathy    Pre-diabetes    Rhabdomyolysis 02/19/2017   Unilateral primary osteoarthritis, left knee 12/14/2021     Home Medications Prior to Admission medications   Medication Sig Start Date End Date Taking? Authorizing Provider  ARIPiprazole (ABILIFY) 15 MG tablet Take 1 tablet (15 mg total) by mouth daily. 12/11/22   Thalia Party, MD  ARIPiprazole ER (ABILIFY MAINTENA) 400 MG SRER injection Inject 2 mLs (400 mg total) into the muscle every 28 (twenty-eight) days. 01/08/23   Thalia Party, MD  bictegravir-emtricitabine-tenofovir AF (BIKTARVY) 50-200-25 MG TABS tablet Take 1 tablet by mouth daily. 11/22/22   Judyann Munson, MD  gabapentin (NEURONTIN) 300 MG capsule Take 1 capsule (300 mg total) by mouth 3 (three) times daily. 12/01/22   Hoy Register, MD  traZODone (DESYREL) 100 MG tablet Take 1 tablet (100  mg total) by mouth at bedtime as needed for sleep. 12/10/22   Thalia Party, MD      Allergies    Penicillins, Pork-derived products, Latex, and Tape    Review of Systems   Review of Systems  HENT:  Positive for congestion.   Respiratory:  Positive for cough.   Musculoskeletal:  Positive for myalgias.    Physical Exam Updated Vital Signs BP 116/77   Pulse 71   Temp 98.9 F (37.2 C)   Resp 17   SpO2 98%  Physical Exam Vitals and nursing note reviewed.  Constitutional:      General: He is not in acute distress.    Appearance: He is well-developed.  HENT:     Head: Normocephalic and atraumatic.  Eyes:     Conjunctiva/sclera: Conjunctivae normal.  Cardiovascular:     Rate and Rhythm: Normal rate and regular rhythm.     Heart sounds: No murmur heard. Pulmonary:     Comments: Left lower lobe Rales Abdominal:     Palpations: Abdomen is soft.     Tenderness: There is no abdominal tenderness.  Musculoskeletal:        General: No swelling.     Cervical back: Neck supple.     Comments: Negative Homans, no erythema  Skin:    General: Skin is warm and dry.     Capillary Refill:  Capillary refill takes less than 2 seconds.  Neurological:     Mental Status: He is alert.  Psychiatric:        Mood and Affect: Mood normal.     ED Results / Procedures / Treatments   Labs (all labs ordered are listed, but only abnormal results are displayed) Labs Reviewed  CBC WITH DIFFERENTIAL/PLATELET - Abnormal; Notable for the following components:      Result Value   WBC 11.1 (*)    Neutro Abs 8.4 (*)    All other components within normal limits  COMPREHENSIVE METABOLIC PANEL - Abnormal; Notable for the following components:   CO2 21 (*)    Glucose, Bld 104 (*)    Total Protein 8.4 (*)    AST 45 (*)    ALT 58 (*)    All other components within normal limits  CK - Abnormal; Notable for the following components:   Total CK 1,576 (*)    All other components within normal limits   RAPID URINE DRUG SCREEN, HOSP PERFORMED - Abnormal; Notable for the following components:   Cocaine POSITIVE (*)    All other components within normal limits  RESP PANEL BY RT-PCR (RSV, FLU A&B, COVID)  RVPGX2  URINALYSIS, ROUTINE W REFLEX MICROSCOPIC    EKG None  Radiology No results found.  Procedures Procedures    Medications Ordered in ED Medications  sodium chloride 0.9 % bolus 1,000 mL (1,000 mLs Intravenous Not Given 12/16/22 0827)  sodium chloride 0.9 % bolus 1,000 mL (0 mLs Intravenous Stopped 12/16/22 0841)  acetaminophen (TYLENOL) tablet 1,000 mg (1,000 mg Oral Given 12/16/22 0827)    ED Course/ Medical Decision Making/ A&P                                 Medical Decision Making  This patient presents to the ED with chief complaint(s) of dark urine, bilateral calf pain.  The complaint involves an extensive differential diagnosis and also carries with it a high risk of complications and morbidity.   pertinent past medical history as listed in HPI  The differential diagnosis includes  UTI, nephrolithiasis, rhabdomyolysis, calf strain, cellulitis, DVT The initial plan is to  Will obtain basic labs, and cxr  Additional history obtained: No additional historians Records reviewed previous admission documents  Initial Assessment:   Patient is hemodynamically stable, afebrile.  Mentating well.  He has no calf pain tenderness or swelling.  Low concern for DVT.  No skin changes, erythema or warmth, low concern for cellulitis.  No fever or flank pain, low concern for pyelonephritis or nephrolithiasis.  No dysuria or increased frequency, low concern for UTI.  With recent drug use with history of rhabdo, most concern for recurrence.  With complaints of productive cough and rales on exam, will obtain chest x-ray.  Suspect bronchitis versus pneumonia.  Independent ECG interpretation:  None, electrolytes within normal limits.  Independent labs interpretation:  The following  labs were independently interpreted:  CMP with some mild elevation in liver enzymes.  Creatinine at baseline.  CBC with mild leukocytosis of 11.1, no anemia.  CK elevated at thousand 1576 suggestive of mild rhabdo.  Tox screen positive for cocaine.  Independent visualization and interpretation of imaging: I independently visualized the following imaging with scope of interpretation limited to determining acute life threatening conditions related to emergency care: Chest x-ray, which did not demonstrate an obvious pneumonia, official radiology read still  pending.  Treatment and Reassessment: Upon presentation patient given 1 L of normal saline  8:10 AM patient states he continues to feel achy in his legs and congested since completing 1 L normal saline.  Will give Tylenol and additional liter of saline  8:40 AM patient reports that he is ready to go.  He is yet to receive second bolus of normal saline.  He would still prefer to go home and sleep.  Official chest x-ray radiology read still pending, however there does not appear to be an obvious pneumonia.  Respiratory panel still yet to be collected.  Patient understands but would still prefer to return home.   Consultations obtained:   none  Disposition:   Patient will be discharged home with close PCP follow up.  Encouraged to use over-the-counter cough and cold medications.  Educated to increase fluid intake and avoid drug use to prevent recurrence of rhabdo.  Provided resources for substance abuse.  The patient has been appropriately medically screened and/or stabilized in the ED. I have low suspicion for any other emergent medical condition which would require further screening, evaluation or treatment in the ED or require inpatient management. At time of discharge the patient is hemodynamically stable and in no acute distress. I have discussed work-up results and diagnosis with patient and answered all questions. Patient is agreeable with  discharge plan. We discussed strict return precautions for returning to the emergency department and they verbalized understanding.     Social Determinants of Health:   none  This note was dictated with voice recognition software.  Despite best efforts at proofreading, errors may have occurred which can change the documentation meaning.          Final Clinical Impression(s) / ED Diagnoses Final diagnoses:  Non-traumatic rhabdomyolysis    Rx / DC Orders ED Discharge Orders     None         Halford Decamp, PA-C 12/16/22 8657    Jacalyn Lefevre, MD 12/16/22 1110

## 2022-12-16 NOTE — ED Provider Triage Note (Signed)
Emergency Medicine Provider Triage Evaluation Note  Dylan [name] , a 57 y.o. male  was evaluated in triage.  Pt complains of dark urine. Was feeling unwell at work today and noticed his urine appeared the color of coffee. Has had some leg cramps as well. Last used cocaine Tuesday. Reports hx of rhabdo in the past, once requiring dialysis x 1.5 months.   Review of Systems  Positive: As above Negative: As above  Physical Exam  BP 116/77   Pulse 71   Temp 98.9 F (37.2 C)   Resp 17   SpO2 98%  Gen:   Awake, no distress   Resp:  Normal effort  MSK:   Moves extremities without difficulty  Other:  Intact distal pulses; RRR  Medical Decision Making  Medically screening exam initiated at 5:57 AM.  Appropriate orders placed.  Dylan [name] was informed that the remainder of the evaluation will be completed by another provider, this initial triage assessment does not replace that evaluation, and the importance of remaining in the ED until their evaluation is complete.  Dark urine - hematuria vs dehydration vs rhabdo? Work up initiated. May be provoked in the setting of cocaine use. Will give IVF.   Dylan Madura, PA-C 12/16/22 0600

## 2022-12-16 NOTE — ED Triage Notes (Signed)
Patient coming from work via EMS with dark urine and leg cramping. Dylan [name] he has a hx of rabdo and AKI. He said this feels similar to when he had it last time. A&Ox4.  Hx of irregular heart beat. Is in A-fib with EMS. States he doesn't take any medications for it.

## 2022-12-16 NOTE — ED Notes (Signed)
Not enough urine for both drug screen and urinalysis. Lab said more will need to be sent down to complete the urinalysis. Patient made aware and fluids hung.

## 2022-12-23 ENCOUNTER — Emergency Department (HOSPITAL_COMMUNITY)
Admission: EM | Admit: 2022-12-23 | Discharge: 2022-12-23 | Disposition: A | Discharge status: Home / Self Care | Payer: MEDICAID | Attending: Emergency Medicine | Admitting: Emergency Medicine

## 2022-12-23 ENCOUNTER — Other Ambulatory Visit: Payer: Self-pay

## 2022-12-23 ENCOUNTER — Emergency Department (HOSPITAL_COMMUNITY): Payer: MEDICAID

## 2022-12-23 DIAGNOSIS — Z91040 Latex allergy status: Secondary | ICD-10-CM | POA: Insufficient documentation

## 2022-12-23 DIAGNOSIS — M25562 Pain in left knee: Secondary | ICD-10-CM | POA: Insufficient documentation

## 2022-12-23 MED ORDER — ACETAMINOPHEN 325 MG PO TABS
650.0000 mg | ORAL_TABLET | Freq: Once | ORAL | Status: AC
  Administered 2022-12-23: 650 mg via ORAL
  Filled 2022-12-23: qty 2

## 2022-12-23 NOTE — ED Notes (Signed)
Attempted to call insurance company for patient , he states they will provide transportation [phone] answering service states only available 7a-6p.  Patient will wait in lobby and attempt to call after lobby  sandwich bag given .

## 2022-12-23 NOTE — ED Triage Notes (Signed)
Pt c/o left knee pain worsening in severity x 1 week. No fevers. No chills. No warmth to touch. Sts new concern tonight as he's had difficulty with mobility. No new trauma / injury. 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

## 2022-12-23 NOTE — ED Provider Notes (Signed)
[place] EMERGENCY DEPARTMENT AT [facility] Provider Note   CSN: [id number] Arrival date & time: 12/23/22  0203     History  Chief Complaint  Patient presents with   Knee Pain    Dylan [name] is a 57 y.o. male.  The history is provided by the patient.  Knee Pain Dylan [name] is a 57 y.o. male who presents to the Emergency Department complaining of knee pain.  He presents to the emergency department for atraumatic left knee pain that started a few hours ago.  He states that he was at work and he started to feel tight in his left knee.  He does not recall if he injured it but states that he was walking quickly.  He does have some difficulty with bending the leg.  No fever, chest pain, difficulty breathing.     Home Medications Prior to Admission medications   Medication Sig Start Date End Date Taking? Authorizing Provider  ARIPiprazole (ABILIFY) 15 MG tablet Take 1 tablet (15 mg total) by mouth daily. 12/11/22   Thalia Party, MD  ARIPiprazole ER (ABILIFY MAINTENA) 400 MG SRER injection Inject 2 mLs (400 mg total) into the muscle every 28 (twenty-eight) days. 01/08/23   Thalia Party, MD  bictegravir-emtricitabine-tenofovir AF (BIKTARVY) 50-200-25 MG TABS tablet Take 1 tablet by mouth daily. 11/22/22   Judyann Munson, MD  gabapentin (NEURONTIN) 300 MG capsule Take 1 capsule (300 mg total) by mouth 3 (three) times daily. 12/01/22   Hoy Register, MD  traZODone (DESYREL) 100 MG tablet Take 1 tablet (100 mg total) by mouth at bedtime as needed for sleep. 12/10/22   Thalia Party, MD      Allergies    Penicillins, Pork-derived products, Latex, and Tape    Review of Systems   Review of Systems  All other systems reviewed and are negative.   Physical Exam Updated Vital Signs BP 116/79 (BP Location: Left Arm)   Pulse 77   Temp 98.7 F (37.1 C) (Oral)   Resp 17   Ht 6\' 2"  (1.88 m)   Wt 103.4 kg   SpO2 99%   BMI 29.27 kg/m  Physical Exam Vitals and nursing  note reviewed.  Constitutional:      Appearance: He is well-developed.  HENT:     Head: Normocephalic and atraumatic.  Cardiovascular:     Rate and Rhythm: Normal rate and regular rhythm.  Pulmonary:     Effort: Pulmonary effort is normal. No respiratory distress.  Musculoskeletal:        General: No tenderness.     Comments: Mild soft tissue swelling to the left knee without any erythema.  He is able to fully flex and extend the knee.  2+ DP pulses.  Skin:    General: Skin is warm and dry.  Neurological:     Mental Status: He is alert and oriented to person, place, and time.  Psychiatric:        Behavior: Behavior normal.     ED Results / Procedures / Treatments   Labs (all labs ordered are listed, but only abnormal results are displayed) Labs Reviewed - No data to display  EKG None  Radiology DG Knee Complete 4 Views Left Result Date: 12/23/2022 CLINICAL DATA:  Pain EXAM: LEFT KNEE - COMPLETE 4+ VIEW COMPARISON:  Left knee x-ray 04/01/2022 FINDINGS: Left knee total arthroplasty present. No evidence of fracture, dislocation, or joint effusion. No evidence of arthropathy or other focal bone abnormality. Soft tissues are unremarkable. IMPRESSION:  Left knee total arthroplasty without evidence of fracture or dislocation. Electronically Signed   By: Darliss Cheney M.D.   On: 12/23/2022 03:21    Procedures Procedures    Medications Ordered in ED Medications  acetaminophen (TYLENOL) tablet 650 mg (has no administration in time range)    ED Course/ Medical Decision Making/ A&P                                 Medical Decision Making Amount and/or Complexity of Data Reviewed Radiology: ordered.  Risk OTC drugs.   Patient with prior knee arthroplasty here for evaluation of several hours of tightness and discomfort to the knee.  Unclear if there was an injury.  He does have some soft tissue swelling to the knee without any exam findings concerning for septic or gouty  arthritis.  Will place in a sleeve for comfort.  Discussed recommendation for orthopedics follow-up.  Presentation is not consistent with cellulitis, DVT.  Discussed OTC analgesics and return precautions.        Final Clinical Impression(s) / ED Diagnoses Final diagnoses:  Acute pain of left knee    Rx / DC Orders ED Discharge Orders     None         Tilden Fossa, MD 12/23/22 [phone]

## 2022-12-28 ENCOUNTER — Other Ambulatory Visit: Payer: Self-pay | Admitting: Internal Medicine

## 2022-12-28 ENCOUNTER — Other Ambulatory Visit: Payer: Self-pay

## 2022-12-28 ENCOUNTER — Other Ambulatory Visit: Payer: Self-pay | Admitting: Family Medicine

## 2022-12-28 NOTE — Telephone Encounter (Signed)
Requested Prescriptions  Refused Prescriptions Disp Refills   meloxicam (MOBIC) 7.5 MG tablet [Pharmacy Med Name: MELOXICAM 7.5 MG TABLET] 30 tablet 1    Sig: TAKE 1 TABLET BY MOUTH EVERY DAY     Analgesics:  COX2 Inhibitors Failed - 12/28/2022 10:28 AM      Failed - Manual Review: Labs are only required if the patient has taken medication for more than 8 weeks.      Failed - AST in normal range and within 360 days    AST  Date Value Ref Range Status  12/16/2022 45 (H) 15 - 41 U/L Final         Failed - ALT in normal range and within 360 days    ALT  Date Value Ref Range Status  12/16/2022 58 (H) 0 - 44 U/L Final         Passed - HGB in normal range and within 360 days    Hemoglobin  Date Value Ref Range Status  12/16/2022 15.9 13.0 - 17.0 g/dL Final         Passed - Cr in normal range and within 360 days    Creat  Date Value Ref Range Status  11/08/2021 1.08 0.70 - 1.30 mg/dL Final   Creatinine, Ser  Date Value Ref Range Status  12/16/2022 1.06 0.61 - 1.24 mg/dL Final   Creatinine, Urine  Date Value Ref Range Status  03/01/2017 33.66 mg/dL Final         Passed - HCT in normal range and within 360 days    HCT  Date Value Ref Range Status  12/16/2022 47.0 39.0 - 52.0 % Final         Passed - eGFR is 30 or above and within 360 days    GFR, Est African American  Date Value Ref Range Status  07/06/2020 92 > OR = 60 mL/min/1.19m2 Final   GFR, Est Non African American  Date Value Ref Range Status  07/06/2020 79 > OR = 60 mL/min/1.57m2 Final   GFR, Estimated  Date Value Ref Range Status  12/16/2022 >60 >60 mL/min Final    Comment:    (NOTE) Calculated using the CKD-EPI Creatinine Equation (2021)    eGFR  Date Value Ref Range Status  09/07/2022 97 >59 mL/min/1.73 Final         Passed - Patient is not pregnant      Passed - Valid encounter within last 12 months    Recent Outpatient Visits           3 months ago Non-traumatic rhabdomyolysis   [facility] [place] Hoy Register, MD   5 months ago Non-traumatic rhabdomyolysis   [place] [facility] [place]. [facility] Hoy Register, MD   11 months ago Gastroesophageal reflux disease without esophagitis   [facility] [place]. [facility] Hoy Register, MD   1 year ago Mixed hyperlipidemia   [facility] [place]. [facility] Hoy Register, MD   2 years ago Schizoaffective disorder, bipolar type [facility])    [facility] [place] Hoy Register, MD

## 2023-01-01 ENCOUNTER — Telehealth: Payer: Self-pay

## 2023-01-01 NOTE — Telephone Encounter (Signed)
Left patient a voice mail to call back to reschedule missed appointment in order to get medication refills.

## 2023-01-04 ENCOUNTER — Inpatient Hospital Stay (HOSPITAL_COMMUNITY)
Admission: EM | Admit: 2023-01-04 | Discharge: 2023-01-08 | DRG: 683 | Disposition: A | Discharge status: Home / Self Care | Payer: MEDICAID | Attending: Internal Medicine | Admitting: Internal Medicine

## 2023-01-04 ENCOUNTER — Other Ambulatory Visit: Payer: Self-pay

## 2023-01-04 ENCOUNTER — Encounter (HOSPITAL_COMMUNITY): Payer: Self-pay | Admitting: Emergency Medicine

## 2023-01-04 DIAGNOSIS — F4310 Post-traumatic stress disorder, unspecified: Secondary | ICD-10-CM | POA: Diagnosis present

## 2023-01-04 DIAGNOSIS — Z833 Family history of diabetes mellitus: Secondary | ICD-10-CM

## 2023-01-04 DIAGNOSIS — Z9151 Personal history of suicidal behavior: Secondary | ICD-10-CM

## 2023-01-04 DIAGNOSIS — R7401 Elevation of levels of liver transaminase levels: Secondary | ICD-10-CM | POA: Diagnosis not present

## 2023-01-04 DIAGNOSIS — Z823 Family history of stroke: Secondary | ICD-10-CM

## 2023-01-04 DIAGNOSIS — Z8249 Family history of ischemic heart disease and other diseases of the circulatory system: Secondary | ICD-10-CM

## 2023-01-04 DIAGNOSIS — Z79899 Other long term (current) drug therapy: Secondary | ICD-10-CM

## 2023-01-04 DIAGNOSIS — Z91014 Allergy to mammalian meats: Secondary | ICD-10-CM

## 2023-01-04 DIAGNOSIS — G629 Polyneuropathy, unspecified: Secondary | ICD-10-CM | POA: Diagnosis present

## 2023-01-04 DIAGNOSIS — I10 Essential (primary) hypertension: Secondary | ICD-10-CM | POA: Diagnosis present

## 2023-01-04 DIAGNOSIS — F1410 Cocaine abuse, uncomplicated: Secondary | ICD-10-CM | POA: Diagnosis present

## 2023-01-04 DIAGNOSIS — M6282 Rhabdomyolysis: Principal | ICD-10-CM | POA: Diagnosis present

## 2023-01-04 DIAGNOSIS — Z7901 Long term (current) use of anticoagulants: Secondary | ICD-10-CM

## 2023-01-04 DIAGNOSIS — Z91048 Other nonmedicinal substance allergy status: Secondary | ICD-10-CM

## 2023-01-04 DIAGNOSIS — R7303 Prediabetes: Secondary | ICD-10-CM | POA: Diagnosis present

## 2023-01-04 DIAGNOSIS — E785 Hyperlipidemia, unspecified: Secondary | ICD-10-CM | POA: Diagnosis present

## 2023-01-04 DIAGNOSIS — F419 Anxiety disorder, unspecified: Secondary | ICD-10-CM | POA: Diagnosis present

## 2023-01-04 DIAGNOSIS — Z91040 Latex allergy status: Secondary | ICD-10-CM

## 2023-01-04 DIAGNOSIS — K219 Gastro-esophageal reflux disease without esophagitis: Secondary | ICD-10-CM | POA: Diagnosis present

## 2023-01-04 DIAGNOSIS — N179 Acute kidney failure, unspecified: Secondary | ICD-10-CM | POA: Diagnosis not present

## 2023-01-04 DIAGNOSIS — Z800 Family history of malignant neoplasm of digestive organs: Secondary | ICD-10-CM

## 2023-01-04 DIAGNOSIS — Z96652 Presence of left artificial knee joint: Secondary | ICD-10-CM | POA: Diagnosis present

## 2023-01-04 DIAGNOSIS — F319 Bipolar disorder, unspecified: Secondary | ICD-10-CM | POA: Diagnosis present

## 2023-01-04 DIAGNOSIS — Z21 Asymptomatic human immunodeficiency virus [HIV] infection status: Secondary | ICD-10-CM | POA: Diagnosis present

## 2023-01-04 DIAGNOSIS — Z87891 Personal history of nicotine dependence: Secondary | ICD-10-CM

## 2023-01-04 DIAGNOSIS — Z860100 Personal history of colon polyps, unspecified: Secondary | ICD-10-CM

## 2023-01-04 LAB — COMPREHENSIVE METABOLIC PANEL
ALT: 319 U/L — ABNORMAL HIGH (ref 0–44)
AST: 1036 U/L — ABNORMAL HIGH (ref 15–41)
Albumin: 3.8 g/dL (ref 3.5–5.0)
Alkaline Phosphatase: 75 U/L (ref 38–126)
Anion gap: 15 (ref 5–15)
BUN: 38 mg/dL — ABNORMAL HIGH (ref 6–20)
CO2: 22 mmol/L (ref 22–32)
Calcium: 9 mg/dL (ref 8.9–10.3)
Chloride: 96 mmol/L — ABNORMAL LOW (ref 98–111)
Creatinine, Ser: 2.95 mg/dL — ABNORMAL HIGH (ref 0.61–1.24)
GFR, Estimated: 24 mL/min — ABNORMAL LOW (ref >60)
Glucose, Bld: 96 mg/dL (ref 70–99)
Potassium: 3.7 mmol/L (ref 3.5–5.1)
Sodium: 133 mmol/L — ABNORMAL LOW (ref 135–145)
Total Bilirubin: 0.8 mg/dL (ref <1.2)
Total Protein: 7.2 g/dL (ref 6.5–8.1)

## 2023-01-04 LAB — CBC
HCT: 43.3 % (ref 39.0–52.0)
Hemoglobin: 14.5 g/dL (ref 13.0–17.0)
MCH: 30.4 pg (ref 26.0–34.0)
MCHC: 33.5 g/dL (ref 30.0–36.0)
MCV: 90.8 fL (ref 80.0–100.0)
Platelets: 206 10*3/uL (ref 150–400)
RBC: 4.77 MIL/uL (ref 4.22–5.81)
RDW: 13.2 % (ref 11.5–15.5)
WBC: 11.6 10*3/uL — ABNORMAL HIGH (ref 4.0–10.5)
nRBC: 0 % (ref 0.0–0.2)

## 2023-01-04 LAB — CBG MONITORING, ED: Glucose-Capillary: 117 mg/dL — ABNORMAL HIGH (ref 70–99)

## 2023-01-04 LAB — PHOSPHORUS: Phosphorus: 5.4 mg/dL — ABNORMAL HIGH (ref 2.5–4.6)

## 2023-01-04 LAB — CK
Total CK: >50000 U/L — ABNORMAL HIGH (ref 49–397)
Total CK: 41690 U/L — ABNORMAL HIGH (ref 49–397)

## 2023-01-04 LAB — MAGNESIUM: Magnesium: 2.5 mg/dL — ABNORMAL HIGH (ref 1.7–2.4)

## 2023-01-04 MED ORDER — FENTANYL CITRATE PF 50 MCG/ML IJ SOSY
50.0000 ug | PREFILLED_SYRINGE | Freq: Once | INTRAMUSCULAR | Status: AC
  Administered 2023-01-04: 50 ug via INTRAVENOUS
  Filled 2023-01-04: qty 1

## 2023-01-04 MED ORDER — OXYCODONE-ACETAMINOPHEN 5-325 MG PO TABS: 2.0000 | ORAL_TABLET | Freq: Once | ORAL | Status: DC

## 2023-01-04 MED ORDER — ONDANSETRON HCL 4 MG/2ML IJ SOLN
4.0000 mg | Freq: Once | INTRAMUSCULAR | Status: AC
  Administered 2023-01-04: 4 mg via INTRAVENOUS
  Filled 2023-01-04: qty 2

## 2023-01-04 MED ORDER — GABAPENTIN 300 MG PO CAPS
300.0000 mg | ORAL_CAPSULE | Freq: Three times a day (TID) | ORAL | Status: DC
  Administered 2023-01-04 – 2023-01-06 (×6): 300 mg via ORAL
  Filled 2023-01-04 (×6): qty 1

## 2023-01-04 MED ORDER — QUETIAPINE FUMARATE ER 50 MG PO TB24
150.0000 mg | ORAL_TABLET | Freq: Every day | ORAL | Status: DC
  Administered 2023-01-04 – 2023-01-07 (×4): 150 mg via ORAL
  Filled 2023-01-04 (×5): qty 3

## 2023-01-04 MED ORDER — ONDANSETRON HCL 4 MG PO TABS
4.0000 mg | ORAL_TABLET | Freq: Four times a day (QID) | ORAL | Status: DC | PRN
Start: 2023-01-04 — End: 2023-01-08

## 2023-01-04 MED ORDER — ONDANSETRON HCL 4 MG/2ML IJ SOLN: 4.0000 mg | Freq: Four times a day (QID) | INTRAMUSCULAR | Status: DC | PRN

## 2023-01-04 MED ORDER — BICTEGRAVIR-EMTRICITAB-TENOFOV 50-200-25 MG PO TABS
1.0000 | ORAL_TABLET | Freq: Every day | ORAL | Status: DC
  Administered 2023-01-04 – 2023-01-08 (×5): 1 via ORAL
  Filled 2023-01-04 (×6): qty 1

## 2023-01-04 MED ORDER — ACETAMINOPHEN 650 MG RE SUPP: 650.0000 mg | Freq: Four times a day (QID) | RECTAL | Status: DC | PRN

## 2023-01-04 MED ORDER — SODIUM CHLORIDE 0.9 % IV SOLN: INTRAVENOUS | Status: AC

## 2023-01-04 MED ORDER — TRAZODONE HCL 50 MG PO TABS
100.0000 mg | ORAL_TABLET | Freq: Every evening | ORAL | Status: DC | PRN
  Administered 2023-01-07: 100 mg via ORAL
  Filled 2023-01-04: qty 2

## 2023-01-04 MED ORDER — SENNOSIDES-DOCUSATE SODIUM 8.6-50 MG PO TABS
1.0000 | ORAL_TABLET | Freq: Every evening | ORAL | Status: DC | PRN
  Administered 2023-01-07: 1 via ORAL
  Filled 2023-01-04: qty 1

## 2023-01-04 MED ORDER — SODIUM CHLORIDE 0.9 % IV BOLUS
1000.0000 mL | Freq: Once | INTRAVENOUS | Status: AC
  Administered 2023-01-04: 1000 mL via INTRAVENOUS

## 2023-01-04 MED ORDER — RIVAROXABAN 10 MG PO TABS
10.0000 mg | ORAL_TABLET | Freq: Every day | ORAL | Status: DC
  Administered 2023-01-04 – 2023-01-08 (×5): 10 mg via ORAL
  Filled 2023-01-04 (×5): qty 1

## 2023-01-04 MED ORDER — SODIUM CHLORIDE 0.9 % IV BOLUS
3000.0000 mL | Freq: Once | INTRAVENOUS | Status: AC
  Administered 2023-01-04: 3000 mL via INTRAVENOUS

## 2023-01-04 MED ORDER — ACETAMINOPHEN 325 MG PO TABS
650.0000 mg | ORAL_TABLET | Freq: Four times a day (QID) | ORAL | Status: DC | PRN
  Administered 2023-01-04 – 2023-01-07 (×3): 650 mg via ORAL
  Filled 2023-01-04 (×3): qty 2

## 2023-01-04 NOTE — ED Notes (Signed)
Phlebotomy to collect blood work

## 2023-01-04 NOTE — Plan of Care (Signed)
Patient alert/oriented X4. Patient compliant with medication administration and skin assessed with Freida Busman, RN on admission. No skin issues noted. Patient required moderate assistance when ambulating from bed to chair and states he lives alone without any support. Patient tolerating cont. IV fluids. VSS, no complaints at this time.   Problem: Education: Goal: Knowledge of General Education information will improve Description: Including pain rating scale, medication(s)/side effects and non-pharmacologic comfort measures Outcome: Progressing   Problem: Health Behavior/Discharge Planning: Goal: Ability to manage health-related needs will improve Outcome: Progressing   Problem: Clinical Measurements: Goal: Ability to maintain clinical measurements within normal limits will improve Outcome: Progressing   Problem: Clinical Measurements: Goal: Will remain free from infection Outcome: Progressing   Problem: Clinical Measurements: Goal: Diagnostic test results will improve Outcome: Progressing   Problem: Clinical Measurements: Goal: Cardiovascular complication will be avoided Outcome: Progressing   Problem: Activity: Goal: Risk for activity intolerance will decrease Outcome: Progressing   Problem: Nutrition: Goal: Adequate nutrition will be maintained Outcome: Progressing   Problem: Coping: Goal: Level of anxiety will decrease Outcome: Progressing   Problem: Elimination: Goal: Will not experience complications related to bowel motility Outcome: Progressing   Problem: Elimination: Goal: Will not experience complications related to urinary retention Outcome: Progressing   Problem: Pain Management: Goal: General experience of comfort will improve Outcome: Progressing   Problem: Safety: Goal: Ability to remain free from injury will improve Outcome: Progressing   Problem: Skin Integrity: Goal: Risk for impaired skin integrity will decrease Outcome: Progressing

## 2023-01-04 NOTE — ED Notes (Signed)
ED TO INPATIENT HANDOFF REPORT  ED Nurse Name and Phone #: Evin Loiseau [phone]  S Name/Age/Gender Dylan [name] 57 y.o. male Room/Bed: H003C/H003C  Code Status   Code Status: Full Code  Home/SNF/Other [employer] Patient oriented to: self, place, time, and situation Is this baseline? Yes   Triage Complete: Triage complete  Chief Complaint Rhabdomyolysis [M62.82]  [facility] Note [facility].  Pt was in the dining room and leg gave out.  Pt endorses he did hit his head on the right side but no LOC.  No blood thinners.  Hx neuropathy & HIV +.  Pt does use cane to get around.    VS BP 124/80, , HR 101, Resp 18, SpO2 98&   Allergies Allergies  Allergen Reactions   Penicillins Other (See Comments)    Reaction type/severity unknown, childhood allergy   Pork-Derived Products Other (See Comments)    Patient preference   Latex Rash   Tape Rash    Level of Care/Admitting Diagnosis ED Disposition     ED Disposition  Admit   Condition  --   Comment  Hospital Area: [facility] [[id number]]  Level of Care: Med-Surg [16]  May place patient in observation at [facility] or [facility] if equivalent level of care is available:: No  Covid Evaluation: Asymptomatic - no recent exposure (last 10 days) testing not required  Diagnosis: Rhabdomyolysis [728.88.ICD-9-CM]  Admitting Physician: Steffanie Rainwater [[id number]]  Attending Physician: Steffanie Rainwater [[id number]]          B Medical/Surgery History 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   Intentional drug overdose (HCC) 11/26/2021   Neuromuscular disorder (HCC)    neuropathy   Neuropathy    Pre-diabetes     Rhabdomyolysis 02/19/2017   Unilateral primary osteoarthritis, left knee 12/14/2021   Past Surgical History:  Procedure Laterality Date   FOOT ARTHRODESIS Right 09/17/2019   Procedure: FUSION RIGHT LISFRANC JOINT;  Surgeon: Nadara Mustard, MD;  Location: [facility] OR;  Service: Orthopedics;  Laterality: Right;   FOOT ARTHRODESIS Right 09/2019   HEMORROIDECTOMY     IR FLUORO GUIDE CV LINE RIGHT  02/22/2017   IR US GUIDE VASC ACCESS RIGHT  02/22/2017   TOTAL KNEE ARTHROPLASTY Left 12/14/2021   Procedure: LEFT TOTAL KNEE ARTHROPLASTY;  Surgeon: Nadara Mustard, MD;  Location: [facility] OR;  Service: Orthopedics;  Laterality: Left;   TUMOR REMOVAL     From Chest     A IV Location/Drains/Wounds Patient Lines/Drains/Airways Status     Active Line/Drains/Airways     Name Placement date Placement time Site Days   Peripheral IV 01/04/23 20 G Anterior;Left;Proximal Forearm 01/04/23  1140  Forearm  less than 1            Intake/Output Last 24 hours  Intake/Output Summary (Last 24 hours) at 01/04/2023 1435 Last data filed at 01/04/2023 0900 Gross per 24 hour  Intake 360 ml  Output --  Net 360 ml    Labs/Imaging Results for orders placed or performed during the hospital encounter of 01/04/23 (from the past 48 hours)  CBG monitoring,  ED     Status: Abnormal   Collection Time: 01/04/23  8:13 AM  Result Value Ref Range   Glucose-Capillary 117 (H) 70 - 99 mg/dL    Comment: Glucose reference range applies only to samples taken after fasting for at least 8 hours.  CBC     Status: Abnormal   Collection Time: 01/04/23  8:31 AM  Result Value Ref Range   WBC 11.6 (H) 4.0 - 10.5 K/uL   RBC 4.77 4.22 - 5.81 MIL/uL   Hemoglobin 14.5 13.0 - 17.0 g/dL   HCT 16.1 09.6 - 04.5 %   MCV 90.8 80.0 - 100.0 fL   MCH 30.4 26.0 - 34.0 pg   MCHC 33.5 30.0 - 36.0 g/dL   RDW 40.9 81.1 - 91.4 %   Platelets 206 150 - 400 K/uL   nRBC 0.0 0.0 - 0.2 %    Comment: Performed at [facility], [address],  [place], [place] [address]  CK     Status: Abnormal   Collection Time: 01/04/23  8:31 AM  Result Value Ref Range   Total CK >50,000 (H) 49 - 397 U/L    Comment: RESULT CONFIRMED BY MANUAL DILUTION Performed at [facility], [address], [place], [place] [address]   Comprehensive metabolic panel     Status: Abnormal   Collection Time: 01/04/23  8:31 AM  Result Value Ref Range   Sodium 133 (L) 135 - 145 mmol/L   Potassium 3.7 3.5 - 5.1 mmol/L   Chloride 96 (L) 98 - 111 mmol/L   CO2 22 22 - 32 mmol/L   Glucose, Bld 96 70 - 99 mg/dL    Comment: Glucose reference range applies only to samples taken after fasting for at least 8 hours.   BUN 38 (H) 6 - 20 mg/dL   Creatinine, Ser 8.65 (H) 0.61 - 1.24 mg/dL   Calcium 9.0 8.9 - 78.4 mg/dL   Total Protein 7.2 6.5 - 8.1 g/dL   Albumin 3.8 3.5 - 5.0 g/dL   AST 6,962 (H) 15 - 41 U/L   ALT 319 (H) 0 - 44 U/L   Alkaline Phosphatase 75 38 - 126 U/L   Total Bilirubin 0.8 <1.2 mg/dL   GFR, Estimated 24 (L) >60 mL/min    Comment: (NOTE) Calculated using the CKD-EPI Creatinine Equation (2021)    Anion gap 15 5 - 15    Comment: Performed at [facility], [address], [place], [place] [address]   No results found.  Pending Labs Unresulted Labs (From admission, onward)     Start     Ordered   01/04/23 1402  CK  Once,   R        01/04/23 1401   01/04/23 1401  T-helper cells (CD4) count (not at [facility])  Once,   R        01/04/23 1400   01/04/23 1400  HIV-1 RNA quant-no reflex-bld  Once,   R        01/04/23 1400   01/04/23 1357  Magnesium  Once,   R        01/04/23 1357   01/04/23 1357  Phosphorus  Once,   R        01/04/23 1357   01/04/23 0821  Rapid urine drug screen (hospital performed)  ONCE - STAT,   STAT        01/04/23 0820   01/04/23 0821  Urinalysis, Routine w reflex microscopic -  Urine, Clean Catch  Once,   URGENT       Question:  Specimen Source  Answer:  Urine, Clean Catch   01/04/23 0820             Vitals/Pain Today's Vitals   01/04/23 0811 01/04/23 1000 01/04/23 1027 01/04/23 1100  BP:  131/85  131/78  Pulse:  89  91  Resp:    17  Temp:   97.8 F (36.6 C) 97.9 F (36.6 C)  TempSrc:   Oral Oral  SpO2:    100%  Weight: 104.3 kg     Height: 6\' 2"  (1.88 m)     PainSc: 6        Isolation Precautions No active isolations  Medications Medications  acetaminophen (TYLENOL) tablet 650 mg (has no administration in time range)    Or  acetaminophen (TYLENOL) suppository 650 mg (has no administration in time range)  senna-docusate (Senokot-S) tablet 1 tablet (has no administration in time range)  ondansetron (ZOFRAN) tablet 4 mg (has no administration in time range)    Or  ondansetron (ZOFRAN) injection 4 mg (has no administration in time range)  rivaroxaban (XARELTO) tablet 10 mg (has no administration in time range)  bictegravir-emtricitabine-tenofovir AF (BIKTARVY) 50-200-25 MG per tablet 1 tablet (has no administration in time range)  gabapentin (NEURONTIN) capsule 300 mg (has no administration in time range)  QUEtiapine (SEROQUEL XR) 24 hr tablet 150 mg (has no administration in time range)  traZODone (DESYREL) tablet 100 mg (has no administration in time range)  0.9 %  sodium chloride infusion (has no administration in time range)  sodium chloride 0.9 % bolus 1,000 mL (0 mLs Intravenous Stopped 01/04/23 1255)  sodium chloride 0.9 % bolus 3,000 mL (3,000 mLs Intravenous New Bag/Given 01/04/23 1250)  fentaNYL (SUBLIMAZE) injection 50 mcg (50 mcg Intravenous Given 01/04/23 1251)  ondansetron (ZOFRAN) injection 4 mg (4 mg Intravenous Given 01/04/23 1338)    Mobility walks with device     Focused Assessments    R Recommendations: See Admitting Provider Note  Report given to:   Additional Notes:

## 2023-01-04 NOTE — ED Triage Notes (Signed)
[facility]  Pt was in the dining room and leg gave out.  Pt endorses he did hit his head on the right side but no LOC.  No blood thinners.  Hx neuropathy & HIV +.  Pt does use cane to get around.    VS BP 124/80, , HR 101, Resp 18, SpO2 98&

## 2023-01-04 NOTE — ED Provider Notes (Signed)
[place] EMERGENCY DEPARTMENT AT [facility] Provider Note   CSN: [id number] Arrival date & time: 01/04/23  0805     History  Chief Complaint  Patient presents with   Marletta Lor    Dylan [name] is a 57 y.o. male.  HPI 57 year old male brought to ED via EMS from [facility] with report of fall.  Patient states that he is having difficulty walking due to pain in his legs.  He states he has a history of rhabdomyolysis and this feels like previous episode of rhabdo with low back pain and leg pain.  He states he fell because his legs feel like he had just been working out and they became generally weak.  He denies any recent injury.  He states that he did work very hard 2 days for Christmas and works at [employer] and go up and down 6 flights of stairs multiple times.  He reports several episodes of rhabdomyolysis in the past triggered by work and while playing football.  He also is HIV positive.  He is taking his HIV medications but states he does occasionally miss them.  He does report some recent cocaine use.    Home Medications Prior to Admission medications   Medication Sig Start Date End Date Taking? Authorizing Provider  BIKTARVY 50-200-25 MG TABS tablet TAKE 1 TABLET BY MOUTH EVERY DAY 12/28/22  Yes Judyann Munson, MD  gabapentin (NEURONTIN) 300 MG capsule Take 1 capsule (300 mg total) by mouth 3 (three) times daily. 12/01/22  Yes Hoy Register, MD  QUEtiapine Fumarate (SEROQUEL XR) 150 MG 24 hr tablet Take 150 mg by mouth at bedtime.   Yes [provider]  traZODone (DESYREL) 100 MG tablet Take 1 tablet (100 mg total) by mouth at bedtime as needed for sleep. 12/10/22  Yes Thalia Party, MD  ARIPiprazole (ABILIFY) 15 MG tablet Take 1 tablet (15 mg total) by mouth daily. Patient not taking: Reported on 01/04/2023 12/11/22   Thalia Party, MD  ARIPiprazole ER (ABILIFY MAINTENA) 400 MG SRER injection Inject 2 mLs (400 mg total) into the muscle every 28  (twenty-eight) days. Patient not taking: Reported on 01/04/2023 01/08/23   Thalia Party, MD      Allergies    Penicillins, Pork-derived products, Latex, and Tape    Review of Systems   Review of Systems  Physical Exam Updated Vital Signs BP (!) 140/84 (BP Location: Right Arm)   Pulse 89   Temp 98.3 F (36.8 C) (Oral)   Resp 18   Ht 1.88 m (6\' 2" )   Wt 103.4 kg   SpO2 100%   BMI 29.27 kg/m  Physical Exam Vitals and nursing note reviewed.  HENT:     Head: Normocephalic.     Right Ear: External ear normal.     Left Ear: External ear normal.     Nose: Nose normal.     Mouth/Throat:     Pharynx: Oropharynx is clear.  Eyes:     Pupils: Pupils are equal, round, and reactive to light.  Cardiovascular:     Rate and Rhythm: Normal rate and regular rhythm.     Pulses: Normal pulses.  Pulmonary:     Effort: Pulmonary effort is normal.  Abdominal:     General: Abdomen is flat.  Musculoskeletal:        General: Normal range of motion.     Cervical back: Normal range of motion.  Skin:    General: Skin is warm.  Capillary Refill: Capillary refill takes less than 2 seconds.  Neurological:     General: No focal deficit present.     Mental Status: He is alert.     Cranial Nerves: No cranial nerve deficit.     Sensory: No sensory deficit.     Motor: No weakness.     Coordination: Coordination normal.  Psychiatric:        Mood and Affect: Mood normal.     ED Results / Procedures / Treatments   Labs (all labs ordered are listed, but only abnormal results are displayed) Labs Reviewed  CBC - Abnormal; Notable for the following components:      Result Value   WBC 11.6 (*)    All other components within normal limits  CK - Abnormal; Notable for the following components:   Total CK >50,000 (*)    All other components within normal limits  COMPREHENSIVE METABOLIC PANEL - Abnormal; Notable for the following components:   Sodium 133 (*)    Chloride 96 (*)    BUN 38 (*)     Creatinine, Ser 2.95 (*)    AST 1,036 (*)    ALT 319 (*)    GFR, Estimated 24 (*)    All other components within normal limits  CBG MONITORING, ED - Abnormal; Notable for the following components:   Glucose-Capillary 117 (*)    All other components within normal limits  RAPID URINE DRUG SCREEN, HOSP PERFORMED  URINALYSIS, ROUTINE W REFLEX MICROSCOPIC  MAGNESIUM  PHOSPHORUS  HIV-1 RNA QUANT-NO REFLEX-BLD  T-HELPER CELLS (CD4) COUNT (NOT AT [facility])  CK    EKG None  Radiology No results found.  Procedures .Critical Care  Performed by: Margarita Grizzle, MD Authorized by: Margarita Grizzle, MD   Critical care provider statement:    Critical care time (minutes):  65   Critical care start time:  01/04/2023 12:33 PM   Critical care end time:  01/04/2023 12:33 PM   Critical care was necessary to treat or prevent imminent or life-threatening deterioration of the following conditions:  Dehydration and metabolic crisis     Medications Ordered in ED Medications  acetaminophen (TYLENOL) tablet 650 mg (has no administration in time range)    Or  acetaminophen (TYLENOL) suppository 650 mg (has no administration in time range)  senna-docusate (Senokot-S) tablet 1 tablet (has no administration in time range)  ondansetron (ZOFRAN) tablet 4 mg (has no administration in time range)    Or  ondansetron (ZOFRAN) injection 4 mg (has no administration in time range)  rivaroxaban (XARELTO) tablet 10 mg (10 mg Oral Given 01/04/23 1509)  bictegravir-emtricitabine-tenofovir AF (BIKTARVY) 50-200-25 MG per tablet 1 tablet (1 tablet Oral Given 01/04/23 1538)  gabapentin (NEURONTIN) capsule 300 mg (has no administration in time range)  QUEtiapine (SEROQUEL XR) 24 hr tablet 150 mg (has no administration in time range)  traZODone (DESYREL) tablet 100 mg (has no administration in time range)  0.9 %  sodium chloride infusion ( Intravenous New Bag/Given 01/04/23 1452)  sodium chloride 0.9 % bolus 1,000 mL (0  mLs Intravenous Stopped 01/04/23 1255)  sodium chloride 0.9 % bolus 3,000 mL (3,000 mLs Intravenous New Bag/Given 01/04/23 1250)  fentaNYL (SUBLIMAZE) injection 50 mcg (50 mcg Intravenous Given 01/04/23 1251)  ondansetron (ZOFRAN) injection 4 mg (4 mg Intravenous Given 01/04/23 1338)    ED Course/ Medical Decision Making/ A&P Clinical Course as of 01/04/23 1550  Thu Jan 04, 2023  0925 CBC is reviewed and interpreted significant for mild  leukocytosis 11,600 otherwise within normal limits Glucose is normal at 117 [DR]    Clinical Course User Index [DR] Margarita Grizzle, MD                                 Medical Decision Making Amount and/or Complexity of Data Reviewed Labs: ordered.  Risk Prescription drug management. Decision regarding hospitalization.   57 year old male presents today complaining of leg pain, dark urine, and rhabdomyolysis.  Patient has some history of rhabdo in the past and has been connected to cocaine use multiple times. Pain is in his legs and low back Differential diagnosis includes but is not limited to diseases of the low back including musculoskeletal etiology of pain, disc disease and other intrinsic back pain etiologies, epidermolysis, trauma from fall, Patient beingevaluated with labs Patient with labs significant for CK greater than 50,000 Creatinine increased to 2.95 Electrolytes essentially normal 1- rhabdoymyolysis- Patient with multiple prior episodes of rhabdomyolysis.  Patient does endorse that he has used cocaine recently.  I suspect this is part of his etiology. Patient is receiving 3 L of saline here. 2 AKI secondary to #1 creatinine has been increased to 2.95.  He has not urinated yet here in the ED. 3 HIV treated with antiretrovirals.  Patient is followed by Dr. Drue Second with infectious disease.  Does not appear to have any acute infectious etiology       Final Clinical Impression(s) / ED Diagnoses Final diagnoses:  Non-traumatic  rhabdomyolysis  AKI (acute kidney injury) [facility])    Rx / DC Orders ED Discharge Orders     None         Margarita Grizzle, MD 01/04/23 1550

## 2023-01-04 NOTE — H&P (Signed)
History and Physical    Patient: Dylan [name] MWN:[id number] DOB: [birth year] DOA: 01/04/2023 DOS: the patient was seen and examined on 01/04/2023 PCP: Hoy Register, MD  Patient coming from: Home (currently living with a friend)  Chief Complaint:  Chief Complaint  Patient presents with   Fall   HPI: Dylan [name] is a 57 y.o. male with medical history significant of HTN, HLD, HIV, prediabetes, rhabdomyolysis, bipolar disorder, PTSD, suicidal ideation and attempt, hepatitis B, anxiety and depression, GERD, osteoarthritis and neuropathy who presented to the ED after a fall due to pain in his legs.  Patient reports that over the last 2 days, he has had some leg cramps and pain.  Today, he was getting some training in [employer] and when he tried to get up from a sitting position, his legs gave out due to significant pain.  States he could tell that he was getting rhabdomyolysis. He reports using cocaine and marijuana 2 days ago but denies any other drug use. 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 He endorse dark urine color, mild nausea and intermittent leg cramping but denies any chest pain, shortness of breath, fevers, chills, dysuria, vomiting, abdominal pain, headache or dizziness.  ED course: Stable vitals.  Labs significant for WBC 11.6, CK >50,000, sodium 133, K+ 3.7, bicarb 22, BUN/creatinine 38/2.95, AST/ALT 1036/319, alk phos 75, bili 0.8 Patient was given 4 L of IV NS and 50 mcg of IV fentanyl in the ED [facility] was consulted for admission  Review of Systems: As mentioned in the history of present illness. All other systems reviewed and are negative. Past Medical History:  Diagnosis Date   Anxiety    Arthritis    Bipolar 1 disorder (HCC)    Colon polyps    Depression    GERD (gastroesophageal reflux disease)    Hepatitis B    Human immunodeficiency virus (HIV) (HCC)    Hyperlipidemia    Hypertension    Insomnia due to  other mental disorder 02/03/2020   Intentional drug overdose (HCC) 11/26/2021   Neuromuscular disorder (HCC)    neuropathy   Neuropathy    Pre-diabetes    Rhabdomyolysis 02/19/2017   Unilateral primary osteoarthritis, left knee 12/14/2021   Past Surgical History:  Procedure Laterality Date   FOOT ARTHRODESIS Right 09/17/2019   Procedure: FUSION RIGHT LISFRANC JOINT;  Surgeon: Nadara Mustard, MD;  Location: [facility] OR;  Service: Orthopedics;  Laterality: Right;   FOOT ARTHRODESIS Right 09/2019   HEMORROIDECTOMY     IR FLUORO GUIDE CV LINE RIGHT  02/22/2017   IR US GUIDE VASC ACCESS RIGHT  02/22/2017   TOTAL KNEE ARTHROPLASTY Left 12/14/2021   Procedure: LEFT TOTAL KNEE ARTHROPLASTY;  Surgeon: Nadara Mustard, MD;  Location: [facility] OR;  Service: Orthopedics;  Laterality: Left;   TUMOR REMOVAL     From Chest   Social History:  reports that he has quit smoking. His smoking use included e-cigarettes and cigarettes. He has a 12.5 pack-year smoking history. He has never used smokeless tobacco. He reports current alcohol use. He reports that he does not currently use drugs after having used the following drugs: Cocaine and Marijuana.  Allergies  Allergen Reactions   Penicillins Other (See Comments)    Reaction type/severity unknown, childhood allergy   Pork-Derived Products Other (See Comments)    Patient preference   Latex Rash   Tape Rash    Family History  Problem Relation Age  of Onset   CAD Mother    Diabetes Father    Stroke Father    Colon cancer Maternal Aunt 60   Diabetes Maternal Aunt    Heart disease Maternal Uncle    Heart attack Maternal Uncle    Esophageal cancer Neg Hx    Rectal cancer Neg Hx    Stomach cancer Neg Hx     Prior to Admission medications   Medication Sig Start Date End Date Taking? Authorizing Provider  BIKTARVY 50-200-25 MG TABS tablet TAKE 1 TABLET BY MOUTH EVERY DAY 12/28/22  Yes Judyann Munson, MD  gabapentin (NEURONTIN) 300 MG capsule Take 1 capsule (300  mg total) by mouth 3 (three) times daily. 12/01/22  Yes Hoy Register, MD  QUEtiapine Fumarate (SEROQUEL XR) 150 MG 24 hr tablet Take 150 mg by mouth at bedtime.   Yes [provider]  traZODone (DESYREL) 100 MG tablet Take 1 tablet (100 mg total) by mouth at bedtime as needed for sleep. 12/10/22  Yes Thalia Party, MD  ARIPiprazole (ABILIFY) 15 MG tablet Take 1 tablet (15 mg total) by mouth daily. Patient not taking: Reported on 01/04/2023 12/11/22   Thalia Party, MD  ARIPiprazole ER (ABILIFY MAINTENA) 400 MG SRER injection Inject 2 mLs (400 mg total) into the muscle every 28 (twenty-eight) days. Patient not taking: Reported on 01/04/2023 01/08/23   Thalia Party, MD    Physical Exam: Vitals:   01/04/23 0811 01/04/23 1000 01/04/23 1027 01/04/23 1100  BP:  131/85  131/78  Pulse:  89  91  Resp:    17  Temp:   97.8 F (36.6 C) 97.9 F (36.6 C)  TempSrc:   Oral Oral  SpO2:    100%  Weight: 104.3 kg     Height: 6\' 2"  (1.88 m)      General: Pleasant, well-appearing middle-age man laying in bed. No acute distress. HEENT: [place]/AT. Anicteric sclera. Dry mucous membrane. CV: RRR. No murmurs, rubs, or gallops. No LE edema Pulmonary: Lungs CTAB. Normal effort. No wheezing or rales. Abdominal: Soft, nontender, nondistended. Normal bowel sounds. Extremities: No tenderness to palpation of the lower extremities.  Palpable radial and DP pulses. Normal ROM. Skin: Warm and dry. No obvious rash or lesions. Neuro: A&Ox3. Moves all extremities. Normal sensation to light touch. No focal deficit. Psych: Normal mood and affect. No SI/HI  Data Reviewed: Labs significant for WBC 11.6, CK >50,000, sodium 133, K+ 3.7, bicarb 22, BUN/creatinine 38/2.95, AST/ALT 1036/319, alk phos 75, bili 0.8  Assessment and Plan: Dylan [name] is a 57 y.o. 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 Multiple hospitalizations and ED visits for similar presentation in the last few months. CK greater than 50,000 on admission from 1500 two weeks ago. Status post 4 L IV NS in the ED. he endorsed symptomatic leg cramps and is now after he received IV fentanyl. Had a long conversation with patient about his use of cocaine being the main cause of his recurrent rhabdo. -Start IV NS 200 cc/h -Follow-up urinalysis and UDS -Check mag, Phos -Trend CK, kidney function and LFTs  # AKI BUN/creatinine elevated to 38/2.95 on admission in the setting of significant rhabdomyolysis. No electrolyte  abnormalities or acidosis. -Continue IV fluids as above -Trend kidney function  # Bipolar disorder # PTSD # Anxiety # Hx of Suicidal ideation and near attempt He denies any HI/SI today. -Continue on quetiapine -Continue as needed trazodone for sleep -Outpatient follow-up with psychiatry  # Elevated LFTs AST/ALT improved to 45/58 about 2 weeks ago.  Not significantly elevated to 1036/319 in the setting of his recurrent rhabdomyolysis -Continue IV fluids as above -Trend CMP   # HIV No recent viral load or CD4 count on file.  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 -Follow-up CD4 count, viral load -Continue Biktarvy   # Neuropathy -Continue home gabapentin   Advance Care Planning:   Code Status: Full Code   Consults: None  Family Communication: No family at bedside  Severity of Illness: The appropriate patient status for this patient is OBSERVATION. Observation status is judged to be reasonable and necessary in order to provide the required intensity of service to ensure the patient's safety. The patient's presenting symptoms,  physical exam findings, and initial radiographic and laboratory data in the context of their medical condition is felt to place them at decreased risk for further clinical deterioration. Furthermore, it is anticipated that the patient will be medically stable for discharge from the hospital within 2 midnights of admission.   Author: Steffanie Rainwater, MD 01/04/2023 1:17 PM  For on call review [email or web address].

## 2023-01-05 DIAGNOSIS — Z87891 Personal history of nicotine dependence: Secondary | ICD-10-CM | POA: Diagnosis not present

## 2023-01-05 DIAGNOSIS — K219 Gastro-esophageal reflux disease without esophagitis: Secondary | ICD-10-CM | POA: Diagnosis present

## 2023-01-05 DIAGNOSIS — N179 Acute kidney failure, unspecified: Secondary | ICD-10-CM | POA: Diagnosis present

## 2023-01-05 DIAGNOSIS — G629 Polyneuropathy, unspecified: Secondary | ICD-10-CM | POA: Diagnosis present

## 2023-01-05 DIAGNOSIS — Z800 Family history of malignant neoplasm of digestive organs: Secondary | ICD-10-CM | POA: Diagnosis not present

## 2023-01-05 DIAGNOSIS — M6282 Rhabdomyolysis: Secondary | ICD-10-CM | POA: Diagnosis present

## 2023-01-05 DIAGNOSIS — R7303 Prediabetes: Secondary | ICD-10-CM | POA: Diagnosis present

## 2023-01-05 DIAGNOSIS — Z860100 Personal history of colon polyps, unspecified: Secondary | ICD-10-CM | POA: Diagnosis not present

## 2023-01-05 DIAGNOSIS — I10 Essential (primary) hypertension: Secondary | ICD-10-CM | POA: Diagnosis present

## 2023-01-05 DIAGNOSIS — Z8249 Family history of ischemic heart disease and other diseases of the circulatory system: Secondary | ICD-10-CM | POA: Diagnosis not present

## 2023-01-05 DIAGNOSIS — Z91048 Other nonmedicinal substance allergy status: Secondary | ICD-10-CM | POA: Diagnosis not present

## 2023-01-05 DIAGNOSIS — Z96652 Presence of left artificial knee joint: Secondary | ICD-10-CM | POA: Diagnosis present

## 2023-01-05 DIAGNOSIS — Z21 Asymptomatic human immunodeficiency virus [HIV] infection status: Secondary | ICD-10-CM | POA: Diagnosis present

## 2023-01-05 DIAGNOSIS — E785 Hyperlipidemia, unspecified: Secondary | ICD-10-CM | POA: Diagnosis present

## 2023-01-05 DIAGNOSIS — Z7901 Long term (current) use of anticoagulants: Secondary | ICD-10-CM | POA: Diagnosis not present

## 2023-01-05 DIAGNOSIS — Z91040 Latex allergy status: Secondary | ICD-10-CM | POA: Diagnosis not present

## 2023-01-05 DIAGNOSIS — F1410 Cocaine abuse, uncomplicated: Secondary | ICD-10-CM | POA: Diagnosis present

## 2023-01-05 DIAGNOSIS — Z91014 Allergy to mammalian meats: Secondary | ICD-10-CM | POA: Diagnosis not present

## 2023-01-05 DIAGNOSIS — R7401 Elevation of levels of liver transaminase levels: Secondary | ICD-10-CM | POA: Diagnosis present

## 2023-01-05 DIAGNOSIS — Z9151 Personal history of suicidal behavior: Secondary | ICD-10-CM | POA: Diagnosis not present

## 2023-01-05 DIAGNOSIS — F319 Bipolar disorder, unspecified: Secondary | ICD-10-CM | POA: Diagnosis present

## 2023-01-05 DIAGNOSIS — F4310 Post-traumatic stress disorder, unspecified: Secondary | ICD-10-CM | POA: Diagnosis present

## 2023-01-05 DIAGNOSIS — Z79899 Other long term (current) drug therapy: Secondary | ICD-10-CM | POA: Diagnosis not present

## 2023-01-05 DIAGNOSIS — F419 Anxiety disorder, unspecified: Secondary | ICD-10-CM | POA: Diagnosis present

## 2023-01-05 LAB — T-HELPER CELLS (CD4) COUNT (NOT AT ARMC)
CD4 % Helper T Cell: 39 % (ref 33–65)
CD4 T Cell Abs: 716 /uL (ref 400–1790)

## 2023-01-05 NOTE — Plan of Care (Signed)
Patient alert/oriented X4. Patient compliant with medication administration and tolerated cont. IV fluids. Patient ambulated around room using front wheel walker hands off. Patient VSS, no complaints at this time.   Problem: Education: Goal: Knowledge of General Education information will improve Description: Including pain rating scale, medication(s)/side effects and non-pharmacologic comfort measures Outcome: Progressing   Problem: Health Behavior/Discharge Planning: Goal: Ability to manage health-related needs will improve Outcome: Progressing   Problem: Clinical Measurements: Goal: Ability to maintain clinical measurements within normal limits will improve Outcome: Progressing   Problem: Clinical Measurements: Goal: Will remain free from infection Outcome: Progressing   Problem: Clinical Measurements: Goal: Diagnostic test results will improve Outcome: Progressing   Problem: Clinical Measurements: Goal: Respiratory complications will improve Outcome: Progressing   Problem: Clinical Measurements: Goal: Cardiovascular complication will be avoided Outcome: Progressing   Problem: Activity: Goal: Risk for activity intolerance will decrease Outcome: Progressing   Problem: Nutrition: Goal: Adequate nutrition will be maintained Outcome: Progressing   Problem: Coping: Goal: Level of anxiety will decrease Outcome: Progressing   Problem: Elimination: Goal: Will not experience complications related to bowel motility Outcome: Progressing   Problem: Elimination: Goal: Will not experience complications related to urinary retention Outcome: Progressing   Problem: Pain Management: Goal: General experience of comfort will improve Outcome: Progressing   Problem: Safety: Goal: Ability to remain free from injury will improve Outcome: Progressing   Problem: Skin Integrity: Goal: Risk for impaired skin integrity will decrease Outcome: Progressing

## 2023-01-05 NOTE — Progress Notes (Signed)
Merrilyn Puma has signed a release of medical record request form. (Owner of halfway house patient lives at.)

## 2023-01-05 NOTE — Progress Notes (Signed)
Patient now denies release of record release to Merrilyn Puma, no information released. Oncoming nurse will be notified to not handout information to him. Medical release form shredded

## 2023-01-05 NOTE — Progress Notes (Signed)
PROGRESS NOTE    Dylan [name]  WJX:[id number] DOB: 1965-12-06 DOA: 01/04/2023 PCP: Hoy Register, MD   Brief Narrative:  Patient is a 57 year old male with history of hypertension hyperlipidemia HIV prediabetes bipolar disorder PTSD with history of suicidal ideation and attempt hepatitis B anxiety depression illicit substance abuse and recurrent episodes of rhabdomyolysis.  Patient presents to the hospital again after cocaine use with worsening muscle pain noted to have profound rhabdomyolysis.  Admitted for IV fluids and close monitoring.  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, POA -Lengthy discussion on cessation of cocaine -Continue IV fluids, follow urine output (improving daily) -CK downtrending  AKI without history of CKD -Secondary to above, urine output improving, urine noted to be quite dark/tea colored  Abnormal liver panel -Secondary to above, continue to follow labs, IV fluids ongoing -Patient has had similar elevations previously with cocaine abuse however this event has moderately higher labs and previous  Bipolar disorder PTSD Depression, anxiety Hx of Suicidal ideation and attempt -Continue current regimen including Seroquel, trazodone -Otherwise stable no indication for psych evaluation  HIV -CD4 716, viral load pending (previously elevated at 21 last October) -Continue home Biktarvy  Neuropathy -Continue home gabapentin  DVT prophylaxis: rivaroxaban (XARELTO) tablet 10 mg Start: 01/04/23 1400 rivaroxaban (XARELTO) tablet 10 mg   Code Status:   Code Status: Full Code  Family Communication: None present  Status is: Inpatient  Dispo: The patient is from: Home              Anticipated d/c is to: Home              Anticipated d/c date is: 48 to 72 hours              Patient currently not medically stable for  discharge  Consultants:  None  Procedures:  None  Antimicrobials:  None indicated  Subjective: No acute issues or events overnight, muscle fatigue and soreness appears to be improving but not yet resolved.  Notes urine is less dark today otherwise denies nausea vomiting diarrhea constipation headache fevers chills or chest pain  Objective: Vitals:   01/04/23 1459 01/04/23 2034 01/04/23 2348 01/05/23 0415  BP: (!) 140/84 137/86 121/87 (!) 131/93  Pulse: 89 87 84 71  Resp: 18 20 18 17   Temp: 98.3 F (36.8 C) 97.8 F (36.6 C)  98.1 F (36.7 C)  TempSrc: Oral Axillary    SpO2: 100% 99% 99% 100%  Weight: 103.4 kg     Height:        Intake/Output Summary (Last 24 hours) at 01/05/2023 0739 Last data filed at 01/04/2023 0900 Gross per 24 hour  Intake 360 ml  Output --  Net 360 ml   Filed Weights   01/04/23 0811 01/04/23 1459  Weight: 104.3 kg 103.4 kg    Examination:  General:  Pleasantly resting in bed, No acute distress. HEENT:  Normocephalic atraumatic.  Sclerae nonicteric, noninjected.  Extraocular movements intact bilaterally. Neck:  Without mass or deformity.  Trachea is midline. Lungs:  Clear to auscultate bilaterally without rhonchi, wheeze, or rales. Heart:  Regular rate and rhythm.  Without murmurs, rubs, or gallops. Abdomen:  Soft, nontender, nondistended.  Without guarding or rebound. Extremities: Tender to palpate diffusely.  Data Reviewed: I have personally reviewed following labs and imaging studies  CBC: Recent Labs  Lab 01/04/23 0831  WBC 11.6*  HGB 14.5  HCT 43.3  MCV 90.8  PLT 206   Basic Metabolic Panel: Recent Labs  Lab 01/04/23 0831 01/04/23 1709  NA 133*  --   K 3.7  --   CL 96*  --   CO2 22  --   GLUCOSE 96  --   BUN 38*  --   CREATININE 2.95*  --   CALCIUM 9.0  --   MG  --  2.5*  PHOS  --  5.4*   GFR: Estimated Creatinine Clearance: 35.4 mL/min (A) (by C-G formula based on SCr of 2.95 mg/dL (H)). Liver Function  Tests: Recent Labs  Lab 01/04/23 0831  AST 1,036*  ALT 319*  ALKPHOS 75  BILITOT 0.8  PROT 7.2  ALBUMIN 3.8   Cardiac Enzymes: Recent Labs  Lab 01/04/23 0831 01/04/23 1709  CKTOTAL >50,000* 41,690*   CBG: Recent Labs  Lab 01/04/23 0813  GLUCAP 117*    Radiology Studies: No results found.  Scheduled Meds:  bictegravir-emtricitabine-tenofovir AF  1 tablet Oral Daily   gabapentin  300 mg Oral TID   QUEtiapine Fumarate  150 mg Oral QHS   rivaroxaban  10 mg Oral Daily   Continuous Infusions:  sodium chloride 200 mL/hr at 01/05/23 0635     LOS: 0 days   Time spent:  Azucena Fallen, DO [facility]  If 7PM-7AM, please contact night-coverage [email or web address]  01/05/2023, 7:39 AM

## 2023-01-06 DIAGNOSIS — N179 Acute kidney failure, unspecified: Secondary | ICD-10-CM | POA: Diagnosis not present

## 2023-01-06 DIAGNOSIS — M6282 Rhabdomyolysis: Secondary | ICD-10-CM | POA: Diagnosis not present

## 2023-01-06 DIAGNOSIS — R7401 Elevation of levels of liver transaminase levels: Secondary | ICD-10-CM | POA: Diagnosis not present

## 2023-01-06 LAB — COMPREHENSIVE METABOLIC PANEL
ALT: 220 U/L — ABNORMAL HIGH (ref 0–44)
AST: 345 U/L — ABNORMAL HIGH (ref 15–41)
Albumin: 2.9 g/dL — ABNORMAL LOW (ref 3.5–5.0)
Alkaline Phosphatase: 58 U/L (ref 38–126)
Anion gap: 8 (ref 5–15)
BUN: 47 mg/dL — ABNORMAL HIGH (ref 6–20)
CO2: 20 mmol/L — ABNORMAL LOW (ref 22–32)
Calcium: 8.3 mg/dL — ABNORMAL LOW (ref 8.9–10.3)
Chloride: 109 mmol/L (ref 98–111)
Creatinine, Ser: 5.05 mg/dL — ABNORMAL HIGH (ref 0.61–1.24)
GFR, Estimated: 13 mL/min — ABNORMAL LOW (ref >60)
Glucose, Bld: 110 mg/dL — ABNORMAL HIGH (ref 70–99)
Potassium: 4.3 mmol/L (ref 3.5–5.1)
Sodium: 137 mmol/L (ref 135–145)
Total Bilirubin: 0.3 mg/dL (ref <1.2)
Total Protein: 5.8 g/dL — ABNORMAL LOW (ref 6.5–8.1)

## 2023-01-06 LAB — HIV-1 RNA QUANT-NO REFLEX-BLD
HIV 1 RNA Quant: 960 {copies}/mL
LOG10 HIV-1 RNA: 2.982 {Log}

## 2023-01-06 LAB — CK: Total CK: 17363 U/L — ABNORMAL HIGH (ref 49–397)

## 2023-01-06 MED ORDER — HYDROMORPHONE HCL 1 MG/ML IJ SOLN
0.5000 mg | Freq: Once | INTRAMUSCULAR | Status: AC
  Administered 2023-01-06: 0.5 mg via INTRAVENOUS
  Filled 2023-01-06: qty 0.5

## 2023-01-06 MED ORDER — GABAPENTIN 300 MG PO CAPS
300.0000 mg | ORAL_CAPSULE | Freq: Two times a day (BID) | ORAL | Status: DC
  Administered 2023-01-06 – 2023-01-08 (×4): 300 mg via ORAL
  Filled 2023-01-06 (×4): qty 1

## 2023-01-06 NOTE — Plan of Care (Signed)

## 2023-01-06 NOTE — Progress Notes (Signed)
PROGRESS NOTE    Dylan [name]  ZOX:[id number] DOB: 1965/05/27 DOA: 01/04/2023 PCP: Hoy Register, MD   Brief Narrative:  Patient is a 57 year old male with history of hypertension hyperlipidemia HIV prediabetes bipolar disorder PTSD with history of suicidal ideation and attempt hepatitis B anxiety depression illicit substance abuse and recurrent episodes of rhabdomyolysis.  Patient presents to the hospital again after cocaine use with worsening muscle pain noted to have profound rhabdomyolysis.  Admitted for IV fluids and close monitoring.  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, POA -Lengthy discussion on cessation of cocaine -Continue IV fluids, follow urine output (improving daily) -CK downtrending appropriately  AKI without history of CKD -Secondary to above, urine output improving, urine noted to be quite dark/tea colored  Abnormal liver panel -Secondary to above, continue to follow labs, IV fluids ongoing -Patient has had similar elevations previously with cocaine abuse however this event has moderately higher labs and previous  Bipolar disorder PTSD Depression, anxiety Hx of Suicidal ideation and attempt -Continue current regimen including Seroquel, trazodone -Otherwise stable no indication for psych evaluation  HIV -CD4 716, viral load pending (previously elevated at 21 last October) -Continue home Biktarvy  Neuropathy -Continue home gabapentin  DVT prophylaxis: rivaroxaban (XARELTO) tablet 10 mg Start: 01/04/23 1400 rivaroxaban (XARELTO) tablet 10 mg   Code Status:   Code Status: Full Code  Family Communication: None present  Status is: Inpatient  Dispo: The patient is from: Home              Anticipated d/c is to: Home              Anticipated d/c date is: 48 to 72 hours              Patient currently not  medically stable for discharge  Consultants:  None  Procedures:  None  Antimicrobials:  None indicated  Subjective: No acute issues or events overnight, muscle fatigue and soreness appears to be improving but not yet resolved.  Notes urine is less dark today otherwise denies nausea vomiting diarrhea constipation headache fevers chills or chest pain  Objective: Vitals:   01/05/23 0739 01/05/23 1548 01/05/23 1954 01/06/23 0344  BP: 124/85 131/77 114/82 116/71  Pulse: 79 84 88 86  Resp: 18  19 20   Temp: 98 F (36.7 C) 98 F (36.7 C) 97.8 F (36.6 C) 98.3 F (36.8 C)  TempSrc:   Oral Oral  SpO2: 100% 100% 99% 96%  Weight:      Height:       No intake or output data in the 24 hours ending 01/06/23 0737  Filed Weights   01/04/23 0811 01/04/23 1459  Weight: 104.3 kg 103.4 kg    Examination:  General:  Pleasantly resting in bed, No acute distress. HEENT:  Normocephalic atraumatic.  Sclerae nonicteric, noninjected.  Extraocular movements intact bilaterally. Neck:  Without mass or deformity.  Trachea is midline. Lungs:  Clear to auscultate bilaterally without rhonchi, wheeze, or rales. Heart:  Regular rate and rhythm.  Without murmurs, rubs, or gallops. Abdomen:  Soft, nontender, nondistended.  Without guarding or rebound. Extremities: Tender to palpate diffusely.  Data Reviewed: I have personally reviewed following labs and imaging studies  CBC: Recent Labs  Lab 01/04/23 0831  WBC 11.6*  HGB 14.5  HCT 43.3  MCV 90.8  PLT 206   Basic Metabolic Panel: Recent Labs  Lab 01/04/23 0831 01/04/23 1709 01/06/23 0402  NA 133*  --  137  K 3.7  --  4.3  CL 96*  --  109  CO2 22  --  20*  GLUCOSE 96  --  110*  BUN 38*  --  47*  CREATININE 2.95*  --  5.05*  CALCIUM 9.0  --  8.3*  MG  --  2.5*  --   PHOS  --  5.4*  --    GFR: Estimated Creatinine Clearance: 20.7 mL/min (A) (by C-G formula based on SCr of 5.05 mg/dL (H)). Liver Function Tests: Recent Labs  Lab  01/04/23 0831 01/06/23 0402  AST 1,036* 345*  ALT 319* 220*  ALKPHOS 75 58  BILITOT 0.8 0.3  PROT 7.2 5.8*  ALBUMIN 3.8 2.9*   Cardiac Enzymes: Recent Labs  Lab 01/04/23 0831 01/04/23 1709 01/06/23 0402  CKTOTAL >50,000* 41,690* 17,363*   CBG: Recent Labs  Lab 01/04/23 0813  GLUCAP 117*    Radiology Studies: No results found.  Scheduled Meds:  bictegravir-emtricitabine-tenofovir AF  1 tablet Oral Daily   gabapentin  300 mg Oral TID   QUEtiapine Fumarate  150 mg Oral QHS   rivaroxaban  10 mg Oral Daily   Continuous Infusions:     LOS: 1 day   Time spent:  Azucena Fallen, DO [facility]  If 7PM-7AM, please contact night-coverage [email or web address]  01/06/2023, 7:37 AM

## 2023-01-07 DIAGNOSIS — N179 Acute kidney failure, unspecified: Secondary | ICD-10-CM | POA: Diagnosis not present

## 2023-01-07 DIAGNOSIS — M6282 Rhabdomyolysis: Secondary | ICD-10-CM | POA: Diagnosis not present

## 2023-01-07 DIAGNOSIS — R7401 Elevation of levels of liver transaminase levels: Secondary | ICD-10-CM | POA: Diagnosis not present

## 2023-01-07 LAB — COMPREHENSIVE METABOLIC PANEL
ALT: 208 U/L — ABNORMAL HIGH (ref 0–44)
AST: 232 U/L — ABNORMAL HIGH (ref 15–41)
Albumin: 3.1 g/dL — ABNORMAL LOW (ref 3.5–5.0)
Alkaline Phosphatase: 73 U/L (ref 38–126)
Anion gap: 8 (ref 5–15)
BUN: 46 mg/dL — ABNORMAL HIGH (ref 6–20)
CO2: 23 mmol/L (ref 22–32)
Calcium: 8.7 mg/dL — ABNORMAL LOW (ref 8.9–10.3)
Chloride: 105 mmol/L (ref 98–111)
Creatinine, Ser: 4.55 mg/dL — ABNORMAL HIGH (ref 0.61–1.24)
GFR, Estimated: 14 mL/min — ABNORMAL LOW (ref >60)
Glucose, Bld: 101 mg/dL — ABNORMAL HIGH (ref 70–99)
Potassium: 4.1 mmol/L (ref 3.5–5.1)
Sodium: 136 mmol/L (ref 135–145)
Total Bilirubin: 0.4 mg/dL (ref <1.2)
Total Protein: 6.3 g/dL — ABNORMAL LOW (ref 6.5–8.1)

## 2023-01-07 LAB — CK: Total CK: 9719 U/L — ABNORMAL HIGH (ref 49–397)

## 2023-01-07 MED ORDER — POLYETHYLENE GLYCOL 3350 17 G PO PACK
17.0000 g | PACK | Freq: Every day | ORAL | Status: DC
  Administered 2023-01-07 – 2023-01-08 (×2): 17 g via ORAL
  Filled 2023-01-07 (×2): qty 1

## 2023-01-07 MED ORDER — OXYCODONE HCL 5 MG PO TABS
5.0000 mg | ORAL_TABLET | ORAL | Status: DC | PRN
  Administered 2023-01-07: 5 mg via ORAL
  Filled 2023-01-07: qty 1

## 2023-01-07 MED ORDER — SODIUM CHLORIDE 0.9 % IV BOLUS
1000.0000 mL | Freq: Once | INTRAVENOUS | Status: AC
  Administered 2023-01-07: 1000 mL via INTRAVENOUS

## 2023-01-07 NOTE — Progress Notes (Signed)
PROGRESS NOTE    Dylan [name]  UXL:[id number] DOB: 1965-02-18 DOA: 01/04/2023 PCP: Hoy Register, MD   Brief Narrative:  Patient is a 57 year old male with history of hypertension hyperlipidemia HIV prediabetes bipolar disorder PTSD with history of suicidal ideation and attempt hepatitis B anxiety depression illicit substance abuse and recurrent episodes of rhabdomyolysis.  Patient presents to the hospital again after cocaine use with worsening muscle pain noted to have profound rhabdomyolysis.  Admitted for IV fluids and close monitoring.  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, POA -Lengthy discussion on cessation of cocaine -Continue IV fluids, follow urine output (improving daily) -CK downtrending appropriately - repeat 1L NS bolus today - follow am labs  AKI without history of CKD -Secondary to above, urine output improving, urine noted to be quite dark/tea colored  Abnormal liver panel -Secondary to above, continue to follow labs, IV fluids ongoing -Patient has had similar elevations previously with cocaine abuse however this event has moderately higher labs and previous  Bipolar disorder PTSD Depression, anxiety Hx of Suicidal ideation and attempt -Continue current regimen including Seroquel, trazodone -Otherwise stable no indication for psych evaluation  HIV -CD4 716, viral load pending (previously elevated at 21 last October) -Continue home Biktarvy  Neuropathy -Continue home gabapentin  DVT prophylaxis: rivaroxaban (XARELTO) tablet 10 mg Start: 01/04/23 1400 rivaroxaban (XARELTO) tablet 10 mg   Code Status:   Code Status: Full Code  Family Communication: None present  Status is: Inpatient  Dispo: The patient is from: Home              Anticipated d/c is to: Home              Anticipated d/c date is: 48 to 72  hours              Patient currently not medically stable for discharge  Consultants:  None  Procedures:  None  Antimicrobials:  None indicated  Subjective: No acute issues or events overnight, muscle fatigue and soreness appears to be improving but not yet resolved.  Notes urine is less dark today otherwise denies nausea vomiting diarrhea constipation headache fevers chills or chest pain  Objective: Vitals:   01/06/23 1455 01/06/23 1936 01/07/23 0408 01/07/23 0746  BP: (!) 154/86 (!) 145/104 (!) 135/93 117/75  Pulse: 93 80 77 72  Resp: 16 18 18 16   Temp:  97.9 F (36.6 C) 97.8 F (36.6 C) 97.6 F (36.4 C)  TempSrc:  Oral Oral Oral  SpO2: 100% 99% 100% 97%  Weight:      Height:        Intake/Output Summary (Last 24 hours) at 01/07/2023 0957 Last data filed at 01/06/2023 1846 Gross per 24 hour  Intake --  Output 1600 ml  Net -1600 ml    Filed Weights   01/04/23 0811 01/04/23 1459  Weight: 104.3 kg 103.4 kg    Examination:  General:  Pleasantly resting in bed, No acute distress. HEENT:  Normocephalic atraumatic.  Sclerae nonicteric, noninjected.  Extraocular movements intact bilaterally. Neck:  Without mass or deformity.  Trachea is midline. Lungs:  Clear to auscultate bilaterally without rhonchi, wheeze, or rales. Heart:  Regular rate and rhythm.  Without murmurs, rubs, or gallops. Abdomen:  Soft, nontender, nondistended.  Without guarding or rebound. Extremities: Tender to palpate diffusely.  Data Reviewed: I have personally reviewed following labs and imaging studies  CBC: Recent Labs  Lab 01/04/23 0831  WBC 11.6*  HGB 14.5  HCT 43.3  MCV 90.8  PLT 206   Basic Metabolic Panel: Recent Labs  Lab 01/04/23 0831 01/04/23 1709 01/06/23 0402 01/07/23 0335  NA 133*  --  137 136  K 3.7  --  4.3 4.1  CL 96*  --  109 105  CO2 22  --  20* 23  GLUCOSE 96  --  110* 101*  BUN 38*  --  47* 46*  CREATININE 2.95*  --  5.05* 4.55*  CALCIUM 9.0  --  8.3*  8.7*  MG  --  2.5*  --   --   PHOS  --  5.4*  --   --    GFR: Estimated Creatinine Clearance: 23 mL/min (A) (by C-G formula based on SCr of 4.55 mg/dL (H)). Liver Function Tests: Recent Labs  Lab 01/04/23 0831 01/06/23 0402 01/07/23 0335  AST 1,036* 345* 232*  ALT 319* 220* 208*  ALKPHOS 75 58 73  BILITOT 0.8 0.3 0.4  PROT 7.2 5.8* 6.3*  ALBUMIN 3.8 2.9* 3.1*   Cardiac Enzymes: Recent Labs  Lab 01/04/23 0831 01/04/23 1709 01/06/23 0402 01/07/23 0335  CKTOTAL >50,000* 41,690* 17,363* 9,719*   CBG: Recent Labs  Lab 01/04/23 0813  GLUCAP 117*    Radiology Studies: No results found.  Scheduled Meds:  bictegravir-emtricitabine-tenofovir AF  1 tablet Oral Daily   gabapentin  300 mg Oral BID   QUEtiapine Fumarate  150 mg Oral QHS   rivaroxaban  10 mg Oral Daily   Continuous Infusions:  sodium chloride        LOS: 2 days   Time spent:  Azucena Fallen, DO [facility]  If 7PM-7AM, please contact night-coverage [email or web address]  01/07/2023, 9:57 AM

## 2023-01-07 NOTE — Plan of Care (Signed)

## 2023-01-07 NOTE — Progress Notes (Signed)
Patient c/o hip and buttock pain since unwitnessed fall prior in the day. Pain 8/10 unrelieved with tylenol. On call provider was notified On time order of dilaudid was ordered.

## 2023-01-08 ENCOUNTER — Other Ambulatory Visit: Payer: Self-pay | Admitting: Internal Medicine

## 2023-01-08 DIAGNOSIS — B181 Chronic viral hepatitis B without delta-agent: Secondary | ICD-10-CM

## 2023-01-08 DIAGNOSIS — M6282 Rhabdomyolysis: Secondary | ICD-10-CM | POA: Diagnosis not present

## 2023-01-08 LAB — COMPREHENSIVE METABOLIC PANEL
ALT: 176 U/L — ABNORMAL HIGH (ref 0–44)
AST: 119 U/L — ABNORMAL HIGH (ref 15–41)
Albumin: 3.3 g/dL — ABNORMAL LOW (ref 3.5–5.0)
Alkaline Phosphatase: 77 U/L (ref 38–126)
Anion gap: 8 (ref 5–15)
BUN: 36 mg/dL — ABNORMAL HIGH (ref 6–20)
CO2: 23 mmol/L (ref 22–32)
Calcium: 9.3 mg/dL (ref 8.9–10.3)
Chloride: 108 mmol/L (ref 98–111)
Creatinine, Ser: 3 mg/dL — ABNORMAL HIGH (ref 0.61–1.24)
GFR, Estimated: 23 mL/min — ABNORMAL LOW (ref >60)
Glucose, Bld: 104 mg/dL — ABNORMAL HIGH (ref 70–99)
Potassium: 4.3 mmol/L (ref 3.5–5.1)
Sodium: 139 mmol/L (ref 135–145)
Total Bilirubin: 0.6 mg/dL (ref <1.2)
Total Protein: 6.9 g/dL (ref 6.5–8.1)

## 2023-01-08 LAB — CK: Total CK: 3393 U/L — ABNORMAL HIGH (ref 49–397)

## 2023-01-08 MED ORDER — RIVAROXABAN 10 MG PO TABS: 10.0000 mg | ORAL_TABLET | Freq: Every day | ORAL | 1 refills | Status: DC

## 2023-01-08 NOTE — Progress Notes (Signed)
Patient refused 0400 vitals. Notified night provider.

## 2023-01-08 NOTE — Progress Notes (Signed)
Patient reports headache 10/10 to left side of head. Patient given Tylenol and ice pack. MD made aware since patient had recent fall. No new orders. Neuro exam remains unchanged.

## 2023-01-08 NOTE — Progress Notes (Signed)
Patient refused bed alarm. Patient walking independently with walker during the day. Patient observed parallel to the bed, states he slipped because the floor was slick, and he slid in his urine because his urinal overflowed. The floor was observed to be dry yet slick. Urinal observed to be in the floor empty. Urinal was at the foot of the bed and patient was noted to have his head in between the wheels of his IV pole. Patient's gown was dry. Patient had no noted injuries and reported that he hit his head on when he fell on the left side. Neuro check unchanged. Patient continued to refuse bed alarm. Patient does have gripper socks and is alert and oriented able to use call light. Dr. [name] made aware, no new orders received at this time.

## 2023-01-08 NOTE — Progress Notes (Signed)
Patient declining bed alarm despite fall on 12/28. Encourage patient to call for assistance, but states he will call if he feels weak.

## 2023-01-08 NOTE — TOC Transition Note (Signed)
Transition of Care [facility]) - Discharge Note   Patient Details  Name: Dylan [name] MRN: [id number] Date of Birth: February 14, 1965  [facility] CM/SW Contact:  Harriet Masson, RN Phone Number: 01/08/2023, 12:35 PM   Clinical Narrative:    Patient stable for discharge.  Patient agreeable to substance abuse resources.  Resources added to AVS. Patient aware.  [employer] called for transportation.Spoke to [employer] [phone]. ID # [id number]. [employer] will pick up patient in 3-4 hours.   Address confirmed.  Final next level of care: Home/Self Care Barriers to Discharge: Barriers Resolved   Patient Goals and CMS Choice Patient states their goals for this hospitalization and ongoing recovery are:: return home          Discharge Placement             home          Discharge Plan and Services Additional resources added to the After Visit Summary for     Discharge Planning Services: CM Consult                                 Social Drivers of Health (SDOH) Interventions SDOH Screenings   Food Insecurity: No Food Insecurity (01/04/2023)  Recent Concern: Food Insecurity - Food Insecurity Present (12/04/2022)  Housing: Low Risk  (01/04/2023)  Transportation Needs: Unmet Transportation Needs (01/04/2023)  Utilities: Not At Risk (01/04/2023)  Alcohol Screen: Low Risk  (12/05/2022)  Depression (PHQ2-9): Low Risk  (09/11/2022)  Tobacco Use: Medium Risk (01/04/2023)     Readmission Risk Interventions    01/08/2023   12:34 PM  Readmission Risk Prevention Plan  Transportation Screening Complete  Medication Review (RN Care Manager) Complete  PCP or Specialist appointment within 3-5 days of discharge Complete  HRI or Home Care Consult Complete  SW Recovery Care/Counseling Consult Complete  Palliative Care Screening Not Applicable  Skilled Nursing Facility Not Applicable

## 2023-01-08 NOTE — Discharge Instructions (Signed)
Addiction and Co-Occurring Disorders   The [facility], Intensive Outpatient Program is held three afternoons from 1 to 4pm; Monday, Wednesday and Friday allowing patients the opportunity to participate in a treatment program in the day while attending A meetings in the evening. W e provide educational lectures and films, group therapy, individual counseling and family counseling.  A multidisciplinary team of the [facility] healthcare professionals, headed by the Medical Director, designs an individualized treatment program to suit the needs of each patient. The length of stay in this program is determined by the goals and objectives set by the treatment team, but usually is 6 to 10 weeks. Direct admission to IOP is possible after an individual evaluation by our assessment team. Chemical usage patterns, the progressionof disease in the individual, the possible need for detoxification and the availability of an external support system are factors in determining who is appropriate for. direct OP admission. Each person has the opportunity to embrace a lifestyle of on-going recovery, family involvement and attending a 12 Step Support group. Program Objectives Include  Chemicaldependency education Abstinence for all mind-altering chemicals 12 Step support Relapse Prevention Planning Improved communication skills Spirituality and personal fulfilment Mondav-Wednesday-Friday 1:00pm to 4:00pm Individual and Family Sessions are set by the Counselor [place], [place], [place] Licensed Clinical Addictions Spcialist [phone]    [facility]enc[facility] Intensive OutpatientPrograms Alcohol & Drug Services (ADS) [address], [place] [address] [place]: [phone] [place]: [place]: [place]: [phone] Both daytime & evening counseling available Accepts Medicaid and other insurances  [facility] four  times a week for 2 1?2 -3 hours each session Both daytime & evening counseling available Accepts Medicaid and other insurances  [facility] CD-IOP - Outpatient Services [address] [name] [address] [phone] Meets 3 days a week for 4 hours Accepts Medicare and other insurances (not Medicaid)  [place].: Substance [place] [address] [name] [address] [phone]  These referrals have been provided to you as appropriate for your clinical needs while taking into account your financial concerns. Please be aware that agencies, practitioners and insurance companies sometimes change contracts. When calling to make an appointment have your insurance information available so the professional you aregoing to see can confirm whether they are covered by your plan. Take this form with you in case the person you are seeing needs a copy or to contact us. Assessment Counselor   [facility] Intensive Outpatient Program [place], [place] [address] [phone] Private insurance, Medicare A&B, and [facility] ADS (Alcohol and Drug Services) [address] [place], [place], [place] [address] [phone] Medicaid, Self Pay  [place] [address] [place], Self Pay  [facility] IOP and Partial Hospitalization Program [place]. [place], [place] [address] [phone] Private Insurance, [place] only for partial [facility] [place]. [place], [place] Private Insurance and Self Pay  [facility] '3 7 1 '$ [place] [address] [place], [place], and Self Pay.  Medical laboratory scientific officer (Partial Hospitalization) [place]  [address] [place] [address] Private Insurance, [place], and Self Pay  [place] [address] [place], [place], [place] [address] [phone] [place]  [place], [place] [address] Phone: [phone] or [phone] [place], [place] [address] [phone] Medicaid, private insurance [facility] [address] [place], [place] [address] [phone] or [phone] Self Pay only, sliding scale  [facility] [address] [place], [place] [address] [phone] State funds for uninsured [place]. [place], [place] Private Insurance, and Self pay [employer], some Medicaid Crisis Mobile: [facility]: [phone] (for crisis response 24 hours a day) [place] [phone]   [place]. [place], [place], and [place] Programs [address] [place], [place], and [place]. [facility]: [phone] ASAP: Adolescent Substance Abuse Program Males ages: 12-17 ASAP: Adolescent Substance abuse Program Females: 12-17 [facility] [employer] Structured Day Program [phone] CrisisMobile: [facility]: [phone] (for crisis response 24 hours a day) [facility] [phone]

## 2023-01-08 NOTE — Discharge Summary (Incomplete)
Physician Discharge Summary  Patient ID: Dylan [name] MRN: [id number] DOB/AGE: Nov 15, 1965 57 y.o.  Admit date: 01/04/2023 Discharge date: 01/08/2023  Admission Diagnoses:  Discharge Diagnoses:  Active Problems:   AKI (acute kidney injury) (HCC)   Rhabdomyolysis   Transaminitis   Discharged Condition: {condition:18240}  Hospital Course: ***  Consults: {consultation:18241}  Significant Diagnostic Studies: {diagnostics:18242}  Treatments: {Tx:18249}  Discharge Exam: Blood pressure 124/86, pulse 75, temperature 98 F (36.7 C), resp. rate 18, height 6\' 2"  (1.88 m), weight 103.4 kg, SpO2 99%. {physical WUJW:1191478}  Disposition: Discharge disposition: 01-Home or Self Care       Discharge Instructions     Diet - low sodium heart healthy   Complete by: As directed    Increase activity slowly   Complete by: As directed       Allergies as of 01/08/2023       Reactions   Penicillins Other (See Comments)   Reaction type/severity unknown, childhood allergy   Pork-derived Products Other (See Comments)   Patient preference   Latex Rash   Tape Rash        Medication List     STOP taking these medications    ARIPiprazole 15 MG tablet Commonly known as: ABILIFY   ARIPiprazole ER 400 MG Srer injection Commonly known as: ABILIFY MAINTENA       TAKE these medications    Biktarvy 50-200-25 MG Tabs tablet Generic drug: bictegravir-emtricitabine-tenofovir AF TAKE 1 TABLET BY MOUTH EVERY DAY   gabapentin 300 MG capsule Commonly known as: NEURONTIN Take 1 capsule (300 mg total) by mouth 3 (three) times daily.   rivaroxaban 10 MG Tabs tablet Commonly known as: XARELTO Take 1 tablet (10 mg total) by mouth daily.   SEROquel XR 150 MG 24 hr tablet Generic drug: QUEtiapine Fumarate Take 150 mg by mouth at bedtime.   traZODone 100 MG tablet Commonly known as: DESYREL Take 1 tablet (100 mg total) by mouth at bedtime as needed for sleep.          SignedBarnetta Chapel 01/08/2023, 11:44 AM

## 2023-01-09 ENCOUNTER — Telehealth: Payer: Self-pay

## 2023-01-09 NOTE — Transitions of Care (Post Inpatient/ED Visit) (Signed)
   01/09/2023  Name: Dylan [name] MRN: [id number] DOB: July 05, 1965  Today's TOC FU Call Status: Today's TOC FU Call Status:: Unsuccessful Call (1st Attempt) Unsuccessful Call (1st Attempt) Date: 01/09/23  Attempted to reach the patient regarding the most recent Inpatient/ED visit.  Follow Up Plan: Additional outreach attempts will be made to reach the patient to complete the Transitions of Care (Post Inpatient/ED visit) call.   Signature  Slater Diesel, RN

## 2023-01-11 ENCOUNTER — Telehealth: Payer: Self-pay

## 2023-01-11 NOTE — Transitions of Care (Post Inpatient/ED Visit) (Signed)
   01/11/2023  Name: Dylan [name] MRN: [id number] DOB: January 04, 1966  Today's TOC FU Call Status: Today's TOC FU Call Status:: Unsuccessful Call (2nd Attempt) Unsuccessful Call (1st Attempt) Date: 01/09/23 Unsuccessful Call (2nd Attempt) Date: 01/11/23  Attempted to reach the patient regarding the most recent Inpatient/ED visit.  Follow Up Plan: Additional outreach attempts will be made to reach the patient to complete the Transitions of Care (Post Inpatient/ED visit) call.   Signature  Slater Diesel, RN

## 2023-01-15 ENCOUNTER — Telehealth: Payer: Self-pay

## 2023-01-15 NOTE — Transitions of Care (Post Inpatient/ED Visit) (Signed)
   01/15/2023  Name: Dylan [name] MRN: [id number] DOB: 11-20-1965  Today's TOC FU Call Status: Today's TOC FU Call Status:: Successful TOC FU Call Completed Unsuccessful Call (1st Attempt) Date: 01/09/23 Unsuccessful Call (2nd Attempt) Date: 01/11/23 [facility] FU Call Complete Date: 01/15/23 Patient's Name and Date of Birth confirmed.  Transition Care Management Follow-up Telephone Call Date of Discharge: 01/08/23 Discharge Facility: [facility]) Type of Discharge: Inpatient Admission Primary Inpatient Discharge Diagnosis:: non traumatic rhabdomyolysis How have you been since you were released from the hospital?: Better Any questions or concerns?: Yes Patient Questions/Concerns:: he wants to know if he should start back on cholesterol medication. Patient Questions/Concerns Addressed: Notified Provider of Patient Questions/Concerns  Items Reviewed: Did you receive and understand the discharge instructions provided?: Yes Medications obtained,verified, and reconciled?: Yes (Medications Reviewed) (He said he is not taking the gabapentin  since he lost weight because his neuropathy has improved.  He has not picked up xarelto  because he is not sure why he is on it.I explained the reason for it per provider's d/c note and he said he will get it today.) Any new allergies since your discharge?: No Dietary orders reviewed?: Yes Type of Diet Ordered:: heart healthy low sodium  Medications Reviewed Today: Medications Reviewed Today     Reviewed by Marvis Bradley, RN (Case Manager) on 01/15/23 at 1150  Med List Status: <None>   Medication Order Taking? Sig Documenting Provider Last Dose Status Informant  BIKTARVY  50-200-25 MG TABS tablet [id number] No TAKE 1 TABLET BY MOUTH EVERY DAY Luiz Channel, MD 01/03/2023 Active Self  gabapentin  (NEURONTIN ) 300 MG capsule [id number] No Take 1 capsule (300 mg total) by mouth 3 (three) times daily. Newlin, Enobong, MD 01/03/2023 Active Self  QUEtiapine  Fumarate  (SEROQUEL  XR) 150 MG 24 hr tablet [id number] No Take 150 mg by mouth at bedtime. [provider] 01/03/2023 Active Self  rivaroxaban  (XARELTO ) 10 MG TABS tablet [id number]  Take 1 tablet (10 mg total) by mouth daily. Rosario Leatrice FERNS, MD  Active   traZODone  (DESYREL ) 100 MG tablet [id number] No Take 1 tablet (100 mg total) by mouth at bedtime as needed for sleep. Alger Ding, MD 01/03/2023 Active Self            Home Care and Equipment/Supplies: Were Home Health Services Ordered?: No Any new equipment or medical supplies ordered?: No  Functional Questionnaire: Do you need assistance with bathing/showering or dressing?: No Do you need assistance with meal preparation?: No Do you need assistance with eating?: No Do you have difficulty maintaining continence: No Do you need assistance with getting out of bed/getting out of a chair/moving?: No (He stated he has a cane to use if needed) Do you have difficulty managing or taking your medications?: No  Follow up appointments reviewed: PCP Follow-up appointment confirmed?: Yes Date of PCP follow-up appointment?: 02/01/23 Follow-up Provider: Jon Johnita Hails, PA Specialist Hospital Follow-up appointment confirmed?: Yes Date of Specialist follow-up appointment?: 01/17/23 Follow-Up Specialty Provider:: [facility]; 02/08/2023-podiatry Do you need transportation to your follow-up appointment?: No (He uses his Medicaid transportation) Do you understand care options if your condition(s) worsen?: Yes-patient verbalized understanding    SIGNATURE Bradley Marvis, RN

## 2023-01-15 NOTE — Telephone Encounter (Signed)
 After additional review of medical record. Pt was referred to [facility] by cardiology for cholesterol management.  I called the patient and explained this to him as well as informed him that the referral was closed because [facility] was not able to reach him.  I instructed him to call the cardiologist and request a new referral.  I also explained that it would be best to call cardiology as they had placed the initial referral. He said he has the cardiologist's phone number and will call.

## 2023-01-16 ENCOUNTER — Other Ambulatory Visit: Payer: Self-pay | Admitting: Internal Medicine

## 2023-01-16 ENCOUNTER — Other Ambulatory Visit: Payer: Self-pay | Admitting: Family Medicine

## 2023-01-16 DIAGNOSIS — B181 Chronic viral hepatitis B without delta-agent: Secondary | ICD-10-CM

## 2023-01-17 ENCOUNTER — Other Ambulatory Visit (HOSPITAL_COMMUNITY): Payer: Self-pay

## 2023-01-17 ENCOUNTER — Encounter: Payer: Self-pay | Admitting: Internal Medicine

## 2023-01-17 ENCOUNTER — Ambulatory Visit (INDEPENDENT_AMBULATORY_CARE_PROVIDER_SITE_OTHER): Payer: MEDICAID | Admitting: Internal Medicine

## 2023-01-17 ENCOUNTER — Other Ambulatory Visit: Payer: Self-pay

## 2023-01-17 VITALS — BP 135/82 | HR 74 | Resp 16 | Ht 74.0 in | Wt 230.0 lb

## 2023-01-17 DIAGNOSIS — M6282 Rhabdomyolysis: Secondary | ICD-10-CM

## 2023-01-17 DIAGNOSIS — B20 Human immunodeficiency virus [HIV] disease: Secondary | ICD-10-CM

## 2023-01-17 DIAGNOSIS — R7401 Elevation of levels of liver transaminase levels: Secondary | ICD-10-CM

## 2023-01-17 DIAGNOSIS — Z113 Encounter for screening for infections with a predominantly sexual mode of transmission: Secondary | ICD-10-CM

## 2023-01-17 NOTE — Progress Notes (Signed)
 RFV: follow up for hiv disease Patient ID: Dylan [name], male   DOB: 1965/10/27, 58 y.o.   MRN: [id number]  HPI [name] is a 58yo M with hiv disease, currently on biktarvy . He states that he was Hospitalized last month for rhabdo. He is steadily improving from his hospitalization Started to have financial difficulty to afford his $4 dolar co pay  Outpatient Encounter Medications as of 01/17/2023  Medication Sig   BIKTARVY  50-200-25 MG TABS tablet TAKE 1 TABLET BY MOUTH EVERY DAY   gabapentin  (NEURONTIN ) 300 MG capsule Take 1 capsule (300 mg total) by mouth 3 (three) times daily.   QUEtiapine  Fumarate (SEROQUEL  XR) 150 MG 24 hr tablet Take 150 mg by mouth at bedtime.   rivaroxaban  (XARELTO ) 10 MG TABS tablet Take 1 tablet (10 mg total) by mouth daily.   traZODone  (DESYREL ) 100 MG tablet Take 1 tablet (100 mg total) by mouth at bedtime as needed for sleep.   No facility-administered encounter medications on file as of 01/17/2023.     Patient Active Problem List   Diagnosis Date Noted   Transaminitis 01/04/2023   Bipolar 1 disorder, depressed (HCC) 12/05/2022   Non-traumatic rhabdomyolysis 12/04/2022   Weakness 11/03/2022   Cough 11/03/2022   Essential hypertension 11/03/2022   Peripheral neuropathy 11/03/2022   Bipolar disorder (HCC) 08/27/2022   Cannabis use disorder 08/27/2022   Bipolar disorder, most recent episode depressed (HCC) 08/26/2022   Rhabdomyolysis 07/01/2022   Non-ST elevated myocardial infarction (HCC) 06/03/2022   Dyslipidemia 06/02/2022   Polysubstance abuse (HCC) 06/02/2022   Obesity (BMI 30-39.9) 06/02/2022   Alcohol use disorder 04/12/2022   PTSD (post-traumatic stress disorder) 04/12/2022   S/P total knee arthroplasty, left 12/14/2021   Total knee replacement status, left 12/14/2021   Chronic fatigue 11/16/2021   Abnormal EKG 11/16/2021   Overweight (BMI 25.0-29.9) 11/16/2021   Elevated LFTs 07/03/2021   Right foot ulcer (HCC) 07/03/2021    Onychomycosis 04/19/2021   Stimulant use disorder 03/16/2021   Nodular radiologic density 02/02/2021   Lesion of liver 02/02/2021   Liver disease 02/02/2021   Vitamin D  deficiency 01/18/2021   Methamphetamine abuse (HCC) 01/17/2021   Lesion of pancreas 01/17/2021   Substance induced mood disorder (HCC) 10/26/2020   Fatty liver 03/09/2020   Generalized anxiety disorder 02/03/2020   Lisfranc dislocation, right, initial encounter    Prediabetes 07/22/2019   Tobacco use disorder 07/10/2018   AKI (acute kidney injury) (HCC)    Suicidal ideations    Chronic 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 coma (HCC)    Acute hepatitis 02/19/2017   Degenerative disc disease 08/21/2011   HIV (human immunodeficiency virus infection) (HCC) 08/17/2011     Health Maintenance Due  Topic Date Due   Zoster Vaccines- Shingrix (1 of 2) Never done   COVID-19 Vaccine (5 - 2024-25 season) 09/10/2022     Review of Systems  Physical Exam   BP 135/82   Pulse 74   Resp 16   Ht 6' 2 (1.88 m)   Wt 230 lb (104.3 kg)   BMI 29.53 kg/m    Lab Results  Component Value Date   CD4TCELL 39 01/04/2023   Lab Results  Component Value Date   CD4TABS 716 01/04/2023   CD4TABS 638 11/08/2021   CD4TABS 910 01/06/2021   Lab Results  Component Value Date   HIV1RNAQUANT 960 01/04/2023   Lab Results  Component Value Date   HEPBSAB NON-REACTIVE 07/24/2017   Lab Results  Component Value  Date   LABRPR NON-REACTIVE 11/08/2021    CBC Lab Results  Component Value Date   WBC 11.6 (H) 01/04/2023   RBC 4.77 01/04/2023   HGB 14.5 01/04/2023   HCT 43.3 01/04/2023   PLT 206 01/04/2023   MCV 90.8 01/04/2023   MCH 30.4 01/04/2023   MCHC 33.5 01/04/2023   RDW 13.2 01/04/2023   LYMPHSABS 1.9 12/16/2022   MONOABS 0.7 12/16/2022   EOSABS 0.0 12/16/2022    BMET Lab Results  Component Value Date   NA 139 01/08/2023   K 4.3 01/08/2023   CL 108 01/08/2023   CO2 23 01/08/2023   GLUCOSE 104 (H)  01/08/2023   BUN 36 (H) 01/08/2023   CREATININE 3.00 (H) 01/08/2023   CALCIUM  9.3 01/08/2023   GFRNONAA 23 (L) 01/08/2023   GFRAA 92 07/06/2020      Assessment and Plan HIV disease= will do the following labs and tests to see that he is still well controlled; will refill biktarvy  Will do anal pap, and sti testing   Long term medication management -will check cr  Rhabdomyelisis =Lets check cmp for transaminitis and elevated cr --to see improving from hospitalization  Lets see back in 4 wk

## 2023-01-18 ENCOUNTER — Encounter: Payer: Self-pay | Admitting: Internal Medicine

## 2023-01-18 DIAGNOSIS — R85612 Low grade squamous intraepithelial lesion on cytologic smear of anus (LGSIL): Secondary | ICD-10-CM

## 2023-01-18 LAB — COMPREHENSIVE METABOLIC PANEL
AG Ratio: 1.5 (calc) (ref 1.0–2.5)
ALT: 50 U/L — ABNORMAL HIGH (ref 9–46)
AST: 33 U/L (ref 10–35)
Albumin: 4.5 g/dL (ref 3.6–5.1)
Alkaline phosphatase (APISO): 101 U/L (ref 35–144)
BUN: 16 mg/dL (ref 7–25)
CO2: 27 mmol/L (ref 20–32)
Calcium: 10.1 mg/dL (ref 8.6–10.3)
Chloride: 101 mmol/L (ref 98–110)
Creat: 1.16 mg/dL (ref 0.70–1.30)
Globulin: 3.1 g/dL (ref 1.9–3.7)
Glucose, Bld: 79 mg/dL (ref 65–99)
Potassium: 4.5 mmol/L (ref 3.5–5.3)
Sodium: 138 mmol/L (ref 135–146)
Total Bilirubin: 0.3 mg/dL (ref 0.2–1.2)
Total Protein: 7.6 g/dL (ref 6.1–8.1)

## 2023-01-18 LAB — CYTOLOGY - NON PAP

## 2023-01-18 MED ORDER — BIKTARVY 50-200-25 MG PO TABS: 1.0000 | ORAL_TABLET | Freq: Every day | ORAL | 5 refills | Status: DC

## 2023-01-31 NOTE — Progress Notes (Deleted)
 Patient ID: Dylan [name], male   DOB: [birth year], 58 y.o.   MRN: [id number]    After hospitalization 12/26-12/30/2024. Discharge Diagnoses:  Active Problems:   AKI (acute kidney injury) (HCC)   Rhabdomyolysis   Transaminitis     Discharged Condition: stable   Hospital Course:  Patient is a 58 year old male with history of hypertension hyperlipidemia HIV prediabetes bipolar disorder PTSD with history of suicidal ideation and attempt hepatitis B anxiety depression illicit substance abuse and recurrent episodes of rhabdomyolysis.  Patient was admitted with profound rhabdomyolysis following cocaine use, and acute kidney injury.  Patient was admitted for further assessment and management.  Patient was aggressively hydrated.  AKI is resolving.  CPK has reduced significantly.  Patient has been counseled to quit cocaine use.  Patient will be discharged back home to the care of the primary care provider.     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   -CPK was greater than 50,000 on presentation.   -CPK was down to 3393 on discharge. -Continue liberal oral fluid.   -Serum creatinine has improved from 5.05-3.     AKI without history of CKD -Secondary to rhabdomyolysis. -Serum creatinine on discharge was 3. -PCP should repeat BMP within 1 week of discharge.     Abnormal liver panel -Secondary to above, continue to follow labs, IV fluids ongoing -Patient has had similar elevations previously with cocaine abuse however this event has moderately higher labs and previous   Bipolar disorder PTSD Depression, anxiety Hx of Suicidal ideation and attempt -Continue current regimen including Seroquel, trazodone -Otherwise stable no indication for psych evaluation   HIV -CD4 716, viral load pending (previously elevated at 21 last October) -Continue home Biktarvy   Neuropathy -Continue home  gabapentin   DVT prophylaxis: rivaroxaban (XARELTO) tablet 10 mg Start: 01/04/23 1400 rivaroxaban (XARELTO) tablet 10 mg

## 2023-01-31 NOTE — Telephone Encounter (Signed)
Per Dr. Drue Second, refer to general surgery for HRA.   Sandie Ano, RN'

## 2023-02-01 ENCOUNTER — Inpatient Hospital Stay: Payer: MEDICAID | Admitting: Physician Assistant

## 2023-02-05 ENCOUNTER — Other Ambulatory Visit: Payer: Self-pay

## 2023-02-05 ENCOUNTER — Encounter (HOSPITAL_COMMUNITY): Payer: Self-pay | Admitting: *Deleted

## 2023-02-05 ENCOUNTER — Emergency Department (HOSPITAL_COMMUNITY)
Admission: EM | Admit: 2023-02-05 | Discharge: 2023-02-05 | Discharge status: Against Medical Advice | Payer: MEDICAID | Attending: Emergency Medicine | Admitting: Emergency Medicine

## 2023-02-05 DIAGNOSIS — R319 Hematuria, unspecified: Secondary | ICD-10-CM | POA: Insufficient documentation

## 2023-02-05 DIAGNOSIS — Z5321 Procedure and treatment not carried out due to patient leaving prior to being seen by health care provider: Secondary | ICD-10-CM | POA: Diagnosis not present

## 2023-02-05 DIAGNOSIS — R531 Weakness: Secondary | ICD-10-CM | POA: Diagnosis not present

## 2023-02-05 LAB — COMPREHENSIVE METABOLIC PANEL
ALT: 74 U/L — ABNORMAL HIGH (ref 0–44)
AST: 142 U/L — ABNORMAL HIGH (ref 15–41)
Albumin: 3.9 g/dL (ref 3.5–5.0)
Alkaline Phosphatase: 97 U/L (ref 38–126)
Anion gap: 12 (ref 5–15)
BUN: 16 mg/dL (ref 6–20)
CO2: 21 mmol/L — ABNORMAL LOW (ref 22–32)
Calcium: 9.2 mg/dL (ref 8.9–10.3)
Chloride: 98 mmol/L (ref 98–111)
Creatinine, Ser: 1.12 mg/dL (ref 0.61–1.24)
GFR, Estimated: >60 mL/min (ref >60)
Glucose, Bld: 109 mg/dL — ABNORMAL HIGH (ref 70–99)
Potassium: 3.6 mmol/L (ref 3.5–5.1)
Sodium: 131 mmol/L — ABNORMAL LOW (ref 135–145)
Total Bilirubin: 1.1 mg/dL (ref 0.0–1.2)
Total Protein: 7.8 g/dL (ref 6.5–8.1)

## 2023-02-05 LAB — CK: Total CK: 5145 U/L — ABNORMAL HIGH (ref 49–397)

## 2023-02-05 LAB — CBG MONITORING, ED: Glucose-Capillary: 111 mg/dL — ABNORMAL HIGH (ref 70–99)

## 2023-02-05 LAB — CBC WITH DIFFERENTIAL/PLATELET
Abs Immature Granulocytes: 0.01 10*3/uL (ref 0.00–0.07)
Basophils Absolute: 0 10*3/uL (ref 0.0–0.1)
Basophils Relative: 0 %
Eosinophils Absolute: 0.2 10*3/uL (ref 0.0–0.5)
Eosinophils Relative: 3 %
HCT: 44.2 % (ref 39.0–52.0)
Hemoglobin: 15 g/dL (ref 13.0–17.0)
Immature Granulocytes: 0 %
Lymphocytes Relative: 43 %
Lymphs Abs: 3 10*3/uL (ref 0.7–4.0)
MCH: 30.5 pg (ref 26.0–34.0)
MCHC: 33.9 g/dL (ref 30.0–36.0)
MCV: 90 fL (ref 80.0–100.0)
Monocytes Absolute: 0.7 10*3/uL (ref 0.1–1.0)
Monocytes Relative: 9 %
Neutro Abs: 3.1 10*3/uL (ref 1.7–7.7)
Neutrophils Relative %: 45 %
Platelets: 245 10*3/uL (ref 150–400)
RBC: 4.91 MIL/uL (ref 4.22–5.81)
RDW: 12.9 % (ref 11.5–15.5)
WBC: 7 10*3/uL (ref 4.0–10.5)
nRBC: 0 % (ref 0.0–0.2)

## 2023-02-05 NOTE — ED Notes (Signed)
Patient aware he needs to get a urine spec. However unable to urinate at this time.

## 2023-02-05 NOTE — ED Notes (Signed)
Pt called to give a urine sample

## 2023-02-05 NOTE — ED Notes (Signed)
Pt gave blanket back and said he was leaving, walk outside and came right back in and said he was staying.  Patient had not been seen in since almost an hour

## 2023-02-05 NOTE — ED Triage Notes (Signed)
Patient presents to ed c/o leg cramps and weakness also has dark urine foe several days states she was on dialysis for 1 month in 2020  , states he has had rabdo in the past.

## 2023-02-05 NOTE — ED Notes (Signed)
Called patient several times patient didn't answer

## 2023-02-08 ENCOUNTER — Ambulatory Visit: Payer: MEDICAID | Admitting: Podiatry

## 2023-02-13 ENCOUNTER — Other Ambulatory Visit: Payer: Self-pay | Admitting: Internal Medicine

## 2023-02-13 ENCOUNTER — Encounter: Payer: Self-pay | Admitting: Internal Medicine

## 2023-02-13 ENCOUNTER — Encounter: Payer: Self-pay | Admitting: Family Medicine

## 2023-02-13 ENCOUNTER — Other Ambulatory Visit (HOSPITAL_COMMUNITY): Payer: Self-pay

## 2023-02-13 DIAGNOSIS — B181 Chronic viral hepatitis B without delta-agent: Secondary | ICD-10-CM

## 2023-02-14 ENCOUNTER — Ambulatory Visit (INDEPENDENT_AMBULATORY_CARE_PROVIDER_SITE_OTHER): Payer: MEDICAID | Admitting: Internal Medicine

## 2023-02-14 ENCOUNTER — Other Ambulatory Visit: Payer: Self-pay

## 2023-02-14 ENCOUNTER — Encounter: Payer: Self-pay | Admitting: Internal Medicine

## 2023-02-14 VITALS — Resp 16 | Ht 74.0 in | Wt 233.4 lb

## 2023-02-14 DIAGNOSIS — R7401 Elevation of levels of liver transaminase levels: Secondary | ICD-10-CM

## 2023-02-14 DIAGNOSIS — M6282 Rhabdomyolysis: Secondary | ICD-10-CM

## 2023-02-14 DIAGNOSIS — B20 Human immunodeficiency virus [HIV] disease: Secondary | ICD-10-CM | POA: Diagnosis not present

## 2023-02-14 DIAGNOSIS — B181 Chronic viral hepatitis B without delta-agent: Secondary | ICD-10-CM

## 2023-02-14 MED ORDER — ROSUVASTATIN CALCIUM 10 MG PO TABS: 10.0000 mg | ORAL_TABLET | Freq: Every day | ORAL | 11 refills | Status: DC

## 2023-02-14 NOTE — Progress Notes (Signed)
 Patient ID: Dylan [name], male   DOB: 03-09-1965, 58 y.o.   MRN: [id number]  HPI Dylan [name] is a 58 year old male with HIV who presents for follow-up after hospitalization for rhabdomyolysis and HIV disease management  He is doing well on his HIV medications, specifically Biktarvy , although he missed one dose recently. He had issues with adherence during the summer when he missed multiple doses but is committed to staying on Biktarvy  as long as possible.  He is concerned about a note in his medical chart regarding pancreatic lesions, especially given his family history of pancreatic cancer, as his cousin recently passed away from it. A previous abdominal CT scan from two years ago was normal, and a recent ultrasound focused on the liver showed either a polyp or a gallstone, with no findings related to the pancreas.  He has a history of fatty liver, which he attributes to weight gain rather than his HIV medications. He acknowledges the need to manage his cholesterol levels, as he is not currently on any medication for it. He previously experienced rhabdomyolysis, which he believes was related to cocaine use rather than cholesterol issues.  He has stopped using cocaine, stating 'I stopped sniffing' and recognizes the need to avoid such substances as he ages.  He reports a persistent skin spot that 'won't go away,' which dries and becomes moist intermittently. He has tried treating it with ringworm cream without success and is considering using a topical steroid to see if it resolves the issue.   Outpatient Encounter Medications as of 02/14/2023  Medication Sig   bictegravir-emtricitabine -tenofovir  AF (BIKTARVY ) 50-200-25 MG TABS tablet Take 1 tablet by mouth daily.   gabapentin  (NEURONTIN ) 300 MG capsule Take 1 capsule (300 mg total) by mouth 3 (three) times daily.   QUEtiapine  Fumarate (SEROQUEL  XR) 150 MG 24 hr tablet Take 150 mg by mouth at bedtime.   rivaroxaban  (XARELTO ) 10 MG TABS  tablet Take 1 tablet (10 mg total) by mouth daily.   traZODone  (DESYREL ) 100 MG tablet Take 1 tablet (100 mg total) by mouth at bedtime as needed for sleep.   No facility-administered encounter medications on file as of 02/14/2023.     Patient Active Problem List   Diagnosis Date Noted   Transaminitis 01/04/2023   Bipolar 1 disorder, depressed (HCC) 12/05/2022   Non-traumatic rhabdomyolysis 12/04/2022   Weakness 11/03/2022   Cough 11/03/2022   Essential hypertension 11/03/2022   Peripheral neuropathy 11/03/2022   Bipolar disorder (HCC) 08/27/2022   Cannabis use disorder 08/27/2022   Bipolar disorder, most recent episode depressed (HCC) 08/26/2022   Rhabdomyolysis 07/01/2022   Non-ST elevated myocardial infarction (HCC) 06/03/2022   Dyslipidemia 06/02/2022   Polysubstance abuse (HCC) 06/02/2022   Obesity (BMI 30-39.9) 06/02/2022   Alcohol use disorder 04/12/2022   PTSD (post-traumatic stress disorder) 04/12/2022   S/P total knee arthroplasty, left 12/14/2021   Total knee replacement status, left 12/14/2021   Chronic fatigue 11/16/2021   Abnormal EKG 11/16/2021   Overweight (BMI 25.0-29.9) 11/16/2021   Elevated LFTs 07/03/2021   Right foot ulcer (HCC) 07/03/2021   Onychomycosis 04/19/2021   Stimulant use disorder 03/16/2021   Nodular radiologic density 02/02/2021   Lesion of liver 02/02/2021   Liver disease 02/02/2021   Vitamin D  deficiency 01/18/2021   Methamphetamine abuse (HCC) 01/17/2021   Lesion of pancreas 01/17/2021   Substance induced mood disorder (HCC) 10/26/2020   Fatty liver 03/09/2020   Generalized anxiety disorder 02/03/2020   Lisfranc dislocation, right, initial encounter  Prediabetes 07/22/2019   Tobacco use disorder 07/10/2018   AKI (acute kidney injury) (HCC)    Suicidal ideations    Chronic 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  HENT: Negative for congestion, sore throat, rhinorrhea, sneezing, trouble swallowing and sinus pressure.  Eyes: Negative for photophobia and visual disturbance.  Respiratory: Negative for cough, chest tightness, shortness of breath, wheezing and stridor.  Cardiovascular: Negative for chest pain, palpitations and leg swelling.  Gastrointestinal: Negative for nausea, vomiting, abdominal pain, diarrhea, constipation, blood in stool, abdominal distention and anal bleeding.  Genitourinary: Negative for dysuria, hematuria, flank pain and difficulty urinating.  Musculoskeletal: Negative for myalgias, back pain, joint swelling, arthralgias and gait problem.  Skin: Negative for color change, pallor, rash and wound.  Neurological: Negative for dizziness, tremors, weakness and light-headedness.  Hematological: Negative for adenopathy. Does not bruise/bleed easily.  Psychiatric/Behavioral: Negative for behavioral problems, confusion, sleep disturbance, dysphoric mood, decreased concentration and agitation.   Physical Exam   Resp 16   Ht 6' 2 (1.88 m)   Wt 233 lb 6.4 oz (105.9 kg)   BMI 29.97 kg/m   Physical Exam  Constitutional: He is oriented to person, place, and time. He appears well-developed and well-nourished. No distress.  HENT:  Mouth/Throat: Oropharynx is clear and moist. No oropharyngeal exudate.  Cardiovascular: Normal rate, regular rhythm and normal heart sounds. Exam reveals no gallop and no friction rub.  No murmur heard.  Pulmonary/Chest: Effort normal and breath sounds normal. No respiratory distress. He has no wheezes.  Abdominal:  Soft. Bowel sounds are normal. He exhibits no distension. There is no tenderness.  Lymphadenopathy:  He has no cervical adenopathy.  Neurological: He is alert and oriented to person, place, and time.  Skin: Skin is warm and dry. No rash noted. No erythema.  Psychiatric: He has a normal mood and affect. His behavior is normal.   Lab Results  Component Value Date   CD4TCELL 39 01/04/2023   Lab Results  Component Value Date   CD4TABS 716 01/04/2023   CD4TABS 638 11/08/2021   CD4TABS 910 01/06/2021   Lab Results  Component Value Date   HIV1RNAQUANT 960 01/04/2023   Lab Results  Component Value Date   HEPBSAB NON-REACTIVE 07/24/2017   Lab Results  Component Value Date   LABRPR NON-REACTIVE 11/08/2021    CBC Lab Results  Component Value Date   WBC 7.0 02/05/2023   RBC 4.91 02/05/2023   HGB 15.0 02/05/2023   HCT 44.2 02/05/2023   PLT 245 02/05/2023   MCV 90.0 02/05/2023   MCH 30.5 02/05/2023   MCHC 33.9 02/05/2023   RDW 12.9 02/05/2023   LYMPHSABS 3.0 02/05/2023   MONOABS 0.7 02/05/2023   EOSABS 0.2 02/05/2023    BMET Lab Results  Component Value Date   NA 131 (L) 02/05/2023   K 3.6 02/05/2023   CL 98 02/05/2023   CO2 21 (L) 02/05/2023   GLUCOSE 109 (H) 02/05/2023   BUN 16 02/05/2023   CREATININE 1.12 02/05/2023   CALCIUM  9.2 02/05/2023  GFRNONAA >60 02/05/2023   GFRAA 92 07/06/2020      Assessment and Plan HIV Infection HIV infection managed with Biktarvy . Reports adherence with one recent missed dose. No new symptoms. Recent hospitalization for rhabdomyolysis, now recovering. Emphasized importance of continuing Biktarvy  and monitoring viral load and CD4 count. - Continue Biktarvy  - Order labs to monitor HIV viral load and CD4 count to see if now undetectable - Follow-up in 5 months  Rhabdomyolysis Rhabdomyolysis likely secondary to cocaine use. Reports cessation of cocaine use. Monitoring recovery and liver function. Emphasized avoiding cocaine to  prevent recurrence. - Order liver function panel to see that it is coming closer to normal  Hyperlipidemia Hyperlipidemia, currently not on medication. Concerned about cholesterol levels and cardiovascular risk. Suspects previous rhabdomyolysis was due to cocaine use, not cholesterol medication. Discussed importance of managing cholesterol to prevent cardiovascular events. - Restart cholesterol medication - Order lipid panel  Fatty Liver Fatty liver identified on previous imaging. Common and often related to weight gain. No immediate concerns but will monitor. Discussed potential need for weight management. - Monitor liver function - Discuss weight management if necessary  Pancreatic Lesion (Unconfirmed) Concern about chart note mentioning pancreatic lesions. Previous abdominal CT from two years ago was normal. Recent ultrasound focused on liver, showing either a polyp or gallstone, but did not capture the pancreas. Plan to perform a full abdominal ultrasound to evaluate pancreas and liver. - Order full abdominal ultrasound to evaluate pancreas and liver  Dermatological Issue Persistent skin lesion, previously treated with antifungal cream without success. Possible fungal infection or other dermatological condition. Discussed trying topical hydrocortisone  and monitoring for improvement. - Apply topical hydrocortisone  - Monitor for improvement  Chronic hepatitis b  - treated while on biktarvy  - will check VL to see that it is undetectable  General Health Maintenance Discussed importance of a heart-healthy diet despite high food prices. Recently graduated from [employer] and aware of nutritional guidelines. Emphasized long-term benefits of a healthy diet. - Encourage heart-healthy diet  Follow-up - Schedule follow-up appointment in 5 months.

## 2023-02-15 LAB — T-HELPER CELL (CD4) - (RCID CLINIC ONLY)
CD4 % Helper T Cell: 34 % (ref 33–65)
CD4 T Cell Abs: 749 /uL (ref 400–1790)

## 2023-02-16 LAB — CBC WITH DIFFERENTIAL/PLATELET
Absolute Lymphocytes: 2443 {cells}/uL (ref 850–3900)
Absolute Monocytes: 445 {cells}/uL (ref 200–950)
Basophils Absolute: 21 {cells}/uL (ref 0–200)
Basophils Relative: 0.4 %
Eosinophils Absolute: 223 {cells}/uL (ref 15–500)
Eosinophils Relative: 4.2 %
HCT: 40.7 % (ref 38.5–50.0)
Hemoglobin: 14 g/dL (ref 13.2–17.1)
MCH: 30.9 pg (ref 27.0–33.0)
MCHC: 34.4 g/dL (ref 32.0–36.0)
MCV: 89.8 fL (ref 80.0–100.0)
MPV: 10.9 fL (ref 7.5–12.5)
Monocytes Relative: 8.4 %
Neutro Abs: 2168 {cells}/uL (ref 1500–7800)
Neutrophils Relative %: 40.9 %
Platelets: 208 10*3/uL (ref 140–400)
RBC: 4.53 10*6/uL (ref 4.20–5.80)
RDW: 13 % (ref 11.0–15.0)
Total Lymphocyte: 46.1 %
WBC: 5.3 10*3/uL (ref 3.8–10.8)

## 2023-02-16 LAB — COMPLETE METABOLIC PANEL WITH GFR
AG Ratio: 1.5 (calc) (ref 1.0–2.5)
ALT: 42 U/L (ref 9–46)
AST: 24 U/L (ref 10–35)
Albumin: 4.4 g/dL (ref 3.6–5.1)
Alkaline phosphatase (APISO): 89 U/L (ref 35–144)
BUN: 19 mg/dL (ref 7–25)
CO2: 27 mmol/L (ref 20–32)
Calcium: 9.9 mg/dL (ref 8.6–10.3)
Chloride: 104 mmol/L (ref 98–110)
Creat: 1.07 mg/dL (ref 0.70–1.30)
Globulin: 3 g/dL (ref 1.9–3.7)
Glucose, Bld: 95 mg/dL (ref 65–99)
Potassium: 4.6 mmol/L (ref 3.5–5.3)
Sodium: 137 mmol/L (ref 135–146)
Total Bilirubin: 0.3 mg/dL (ref 0.2–1.2)
Total Protein: 7.4 g/dL (ref 6.1–8.1)
eGFR: 81 mL/min/{1.73_m2} (ref >=60)

## 2023-02-16 LAB — TEST AUTHORIZATION

## 2023-02-16 LAB — LIPID PANEL
Cholesterol: 261 mg/dL — ABNORMAL HIGH (ref <200)
HDL: 53 mg/dL (ref >=40)
LDL Cholesterol (Calc): 171 mg/dL — ABNORMAL HIGH
Non-HDL Cholesterol (Calc): 208 mg/dL — ABNORMAL HIGH (ref <130)
Total CHOL/HDL Ratio: 4.9 (calc) (ref <5.0)
Triglycerides: 208 mg/dL — ABNORMAL HIGH (ref <150)

## 2023-02-16 LAB — HEPATITIS B DNA, ULTRAQUANTITATIVE, PCR
Hepatitis B DNA: NOT DETECTED [IU]/mL
Hepatitis B virus DNA: NOT DETECTED {Log}

## 2023-02-16 LAB — RPR: RPR Ser Ql: NONREACTIVE

## 2023-02-16 LAB — HIV-1 RNA QUANT-NO REFLEX-BLD
HIV 1 RNA Quant: 48 {copies}/mL — ABNORMAL HIGH
HIV-1 RNA Quant, Log: 1.68 {Log} — ABNORMAL HIGH

## 2023-02-16 LAB — HEPATITIS B SURFACE ANTIGEN: Hepatitis B Surface Ag: REACTIVE — AB

## 2023-03-05 ENCOUNTER — Encounter: Payer: Self-pay | Admitting: Orthopedic Surgery

## 2023-03-05 ENCOUNTER — Ambulatory Visit (INDEPENDENT_AMBULATORY_CARE_PROVIDER_SITE_OTHER): Payer: MEDICAID | Admitting: Orthopedic Surgery

## 2023-03-05 DIAGNOSIS — S93324D Dislocation of tarsometatarsal joint of right foot, subsequent encounter: Secondary | ICD-10-CM

## 2023-03-05 DIAGNOSIS — M79671 Pain in right foot: Secondary | ICD-10-CM

## 2023-03-05 DIAGNOSIS — S93324S Dislocation of tarsometatarsal joint of right foot, sequela: Secondary | ICD-10-CM

## 2023-03-05 NOTE — Progress Notes (Signed)
 Office Visit Note   Patient: Dylan [name]           Date of Birth: [birth year]           MRN: [id number] Visit Date: 03/05/2023              Requested by: Hoy Register, MD [address] [place],  [place] [address] PCP: Hoy Register, MD  Chief Complaint  Patient presents with   Left Knee - Pain   Right Foot - Pain      HPI: Patient is a 58 year old gentleman who is seen for 2 separate issues.  #1 he is status post a left total knee arthroplasty over a year ago.  He states his feels well occasionally has a clicking sound with walking.  Patient states he is has pain across the Lisfranc complex.  Patient is status post Lisfranc fusion 3-1/2 years ago.  Patient states he has pain from the callus bone dorsally and plantarly.  Assessment & Plan: Visit Diagnoses:  1. Lisfranc dislocation, right, sequela   2. Pain in right foot     Plan: Will obtain a CT scan of the right foot to evaluate for possible fibrous union.  Recommended topical Voltaren gel.  Follow-Up Instructions: Return in about 2 weeks (around 03/19/2023).   Ortho Exam  Patient is alert, oriented, no adenopathy, well-dressed, normal affect, normal respiratory effort. Examination patient has good range of motion of the left knee.  Patella is midline.  Examination the right foot patient has hypertrophic callus dorsally and plantarly at the Lisfranc fusion.  Previous radiographs shows stable alignment.  Patient has no pain with distraction across the Lisfranc complex.  There is no redness no cellulitis no signs of infection.  Imaging: No results found. No images are attached to the encounter.  Labs: Lab Results  Component Value Date   HGBA1C 5.9 (H) 12/08/2022   HGBA1C 6.2 (H) 04/03/2022   HGBA1C 5.8 (H) 09/01/2021   ESRSEDRATE 10 11/03/2022   ESRSEDRATE 14 09/07/2022   ESRSEDRATE 2 07/14/2020   CRP <1 09/07/2022   REPTSTATUS 07/02/2022 FINAL 07/01/2022   CULT  07/01/2022    NO  GROWTH Performed at [facility], [address] [place], [place] [address]      Lab Results  Component Value Date   ALBUMIN 3.9 02/05/2023   ALBUMIN 3.3 (L) 01/08/2023   ALBUMIN 3.1 (L) 01/07/2023    Lab Results  Component Value Date   MG 2.5 (H) 01/04/2023   MG 2.5 (H) 08/25/2022   MG 2.5 (H) 03/30/2022   Lab Results  Component Value Date   VD25OH 14.4 (L) 01/17/2021    No results found for: "PREALBUMIN"    Latest Ref Rng & Units 02/14/2023   10:41 AM 02/05/2023    4:57 AM 01/04/2023    8:31 AM  CBC EXTENDED  WBC 3.8 - 10.8 Thousand/uL 5.3  7.0  11.6   RBC 4.20 - 5.80 Million/uL 4.53  4.91  4.77   Hemoglobin 13.2 - 17.1 g/dL 82.9  56.2  13.0   HCT 38.5 - 50.0 % 40.7  44.2  43.3   Platelets 140 - 400 Thousand/uL 208  245  206   NEUT# 1,500 - 7,800 cells/uL 2,168  3.1    Lymph# 0.7 - 4.0 K/uL  3.0       There is no height or weight on file to calculate BMI.  Orders:  Orders Placed This Encounter  Procedures   CT  FOOT RIGHT WO CONTRAST   No orders of the defined types were placed in this encounter.    Procedures: No procedures performed  Clinical Data: No additional findings.  ROS:  All other systems negative, except as noted in the HPI. Review of Systems  Objective: Vital Signs: There were no vitals taken for this visit.  Specialty Comments:  No specialty comments available.  PMFS History: Patient Active Problem List   Diagnosis Date Noted   Transaminitis 01/04/2023   Bipolar 1 disorder, depressed (HCC) 12/05/2022   Non-traumatic rhabdomyolysis 12/04/2022   Weakness 11/03/2022   Cough 11/03/2022   Essential hypertension 11/03/2022   Peripheral neuropathy 11/03/2022   Bipolar disorder (HCC) 08/27/2022   Cannabis use disorder 08/27/2022   Bipolar disorder, most recent episode depressed (HCC) 08/26/2022   Rhabdomyolysis 07/01/2022   Non-ST elevated myocardial infarction (HCC) 06/03/2022   Dyslipidemia 06/02/2022   Polysubstance  abuse (HCC) 06/02/2022   Obesity (BMI 30-39.9) 06/02/2022   Alcohol use disorder 04/12/2022   PTSD (post-traumatic stress disorder) 04/12/2022   S/P total knee arthroplasty, left 12/14/2021   Total knee replacement status, left 12/14/2021   Chronic fatigue 11/16/2021   Abnormal EKG 11/16/2021   Overweight (BMI 25.0-29.9) 11/16/2021   Elevated LFTs 07/03/2021   Right foot ulcer (HCC) 07/03/2021   Onychomycosis 04/19/2021   Stimulant use disorder 03/16/2021   Nodular radiologic density 02/02/2021   Lesion of liver 02/02/2021   Liver disease 02/02/2021   Vitamin D deficiency 01/18/2021   Methamphetamine abuse (HCC) 01/17/2021   Lesion of pancreas 01/17/2021   Substance induced mood disorder (HCC) 10/26/2020   Fatty liver 03/09/2020   Generalized anxiety disorder 02/03/2020   Lisfranc dislocation, right, initial encounter    Prediabetes 07/22/2019   Tobacco use disorder 07/10/2018   AKI (acute kidney injury) (HCC)    Suicidal ideations    Chronic 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 coma (HCC)    Acute hepatitis 02/19/2017   Degenerative disc disease 08/21/2011   HIV (human immunodeficiency virus infection) (HCC) 08/17/2011   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   Intentional drug overdose (HCC) 11/26/2021   Neuromuscular disorder (HCC)    neuropathy   Neuropathy    Pre-diabetes    Rhabdomyolysis 02/19/2017   Unilateral primary osteoarthritis, left knee 12/14/2021    Family History  Problem Relation Age of Onset   CAD Mother    Diabetes Father    Stroke Father    Colon cancer Maternal Aunt 60   Diabetes Maternal Aunt    Heart disease Maternal Uncle    Heart attack Maternal Uncle     Esophageal cancer Neg Hx    Rectal cancer Neg Hx    Stomach cancer Neg Hx     Past Surgical History:  Procedure Laterality Date   FOOT ARTHRODESIS Right 09/17/2019   Procedure: FUSION RIGHT LISFRANC JOINT;  Surgeon: Nadara Mustard, MD;  Location: [facility] OR;  Service: Orthopedics;  Laterality: Right;   FOOT ARTHRODESIS Right 09/2019   HEMORROIDECTOMY     IR FLUORO GUIDE CV LINE RIGHT  02/22/2017   IR US GUIDE VASC ACCESS RIGHT  02/22/2017  TOTAL KNEE ARTHROPLASTY Left 12/14/2021   Procedure: LEFT TOTAL KNEE ARTHROPLASTY;  Surgeon: Nadara Mustard, MD;  Location: [facility] OR;  Service: Orthopedics;  Laterality: Left;   TUMOR REMOVAL     From Chest   Social History   Occupational History   Not on file  Tobacco Use   Smoking status: Former    Current packs/day: 0.50    Average packs/day: 0.5 packs/day for 25.0 years (12.5 ttl pk-yrs)    Types: E-cigarettes, Cigarettes   Smokeless tobacco: Never   Tobacco comments:    Vapes daily  Vaping Use   Vaping status: Every Day   Start date: 12/09/2020   Substances: Nicotine  Substance and Sexual Activity   Alcohol use: Yes    Comment: last drink March 2024   Drug use: Not Currently    Types: Cocaine, Marijuana    Comment: Last used cocaine March 2024; last used cannabis 2022   Sexual activity: Yes    Partners: Female

## 2023-03-09 ENCOUNTER — Emergency Department (HOSPITAL_COMMUNITY)
Admission: EM | Admit: 2023-03-09 | Discharge: 2023-03-10 | Disposition: A | Discharge status: Other Acute Inpatient Hospital | Payer: MEDICAID | Attending: Emergency Medicine | Admitting: Emergency Medicine

## 2023-03-09 ENCOUNTER — Encounter (HOSPITAL_COMMUNITY): Payer: Self-pay

## 2023-03-09 ENCOUNTER — Other Ambulatory Visit: Payer: Self-pay

## 2023-03-09 DIAGNOSIS — X58XXXA Exposure to other specified factors, initial encounter: Secondary | ICD-10-CM | POA: Insufficient documentation

## 2023-03-09 DIAGNOSIS — Z91040 Latex allergy status: Secondary | ICD-10-CM | POA: Insufficient documentation

## 2023-03-09 DIAGNOSIS — Z21 Asymptomatic human immunodeficiency virus [HIV] infection status: Secondary | ICD-10-CM | POA: Insufficient documentation

## 2023-03-09 DIAGNOSIS — R45851 Suicidal ideations: Secondary | ICD-10-CM | POA: Diagnosis not present

## 2023-03-09 DIAGNOSIS — T1491XA Suicide attempt, initial encounter: Secondary | ICD-10-CM | POA: Insufficient documentation

## 2023-03-09 LAB — CBC
HCT: 41.1 % (ref 39.0–52.0)
Hemoglobin: 14.1 g/dL (ref 13.0–17.0)
MCH: 31.3 pg (ref 26.0–34.0)
MCHC: 34.3 g/dL (ref 30.0–36.0)
MCV: 91.3 fL (ref 80.0–100.0)
Platelets: 221 10*3/uL (ref 150–400)
RBC: 4.5 MIL/uL (ref 4.22–5.81)
RDW: 13.4 % (ref 11.5–15.5)
WBC: 8.1 10*3/uL (ref 4.0–10.5)
nRBC: 0 % (ref 0.0–0.2)

## 2023-03-09 LAB — ACETAMINOPHEN LEVEL: Acetaminophen (Tylenol), Serum: <10 ug/mL — ABNORMAL LOW (ref 10–30)

## 2023-03-09 LAB — COMPREHENSIVE METABOLIC PANEL
ALT: 49 U/L — ABNORMAL HIGH (ref 0–44)
AST: 103 U/L — ABNORMAL HIGH (ref 15–41)
Albumin: 4.2 g/dL (ref 3.5–5.0)
Alkaline Phosphatase: 80 U/L (ref 38–126)
Anion gap: 10 (ref 5–15)
BUN: 25 mg/dL — ABNORMAL HIGH (ref 6–20)
CO2: 25 mmol/L (ref 22–32)
Calcium: 9.6 mg/dL (ref 8.9–10.3)
Chloride: 101 mmol/L (ref 98–111)
Creatinine, Ser: 1.1 mg/dL (ref 0.61–1.24)
GFR, Estimated: >60 mL/min (ref >60)
Glucose, Bld: 99 mg/dL (ref 70–99)
Potassium: 3.8 mmol/L (ref 3.5–5.1)
Sodium: 136 mmol/L (ref 135–145)
Total Bilirubin: 0.8 mg/dL (ref 0.0–1.2)
Total Protein: 7.5 g/dL (ref 6.5–8.1)

## 2023-03-09 LAB — SALICYLATE LEVEL: Salicylate Lvl: <7 mg/dL — ABNORMAL LOW (ref 7.0–30.0)

## 2023-03-09 LAB — ETHANOL: Alcohol, Ethyl (B): <10 mg/dL (ref <10)

## 2023-03-09 MED ORDER — ROSUVASTATIN CALCIUM 5 MG PO TABS
10.0000 mg | ORAL_TABLET | Freq: Every day | ORAL | Status: DC
  Administered 2023-03-09: 10 mg via ORAL
  Filled 2023-03-09: qty 2

## 2023-03-09 MED ORDER — BICTEGRAVIR-EMTRICITAB-TENOFOV 50-200-25 MG PO TABS
1.0000 | ORAL_TABLET | Freq: Every day | ORAL | Status: DC
  Administered 2023-03-09: 1 via ORAL
  Filled 2023-03-09 (×2): qty 1

## 2023-03-09 MED ORDER — TRAZODONE HCL 100 MG PO TABS
100.0000 mg | ORAL_TABLET | Freq: Every evening | ORAL | Status: DC | PRN
  Administered 2023-03-09: 100 mg via ORAL

## 2023-03-09 MED ORDER — GABAPENTIN 300 MG PO CAPS
300.0000 mg | ORAL_CAPSULE | Freq: Three times a day (TID) | ORAL | Status: DC
  Administered 2023-03-09: 300 mg via ORAL
  Filled 2023-03-09: qty 1

## 2023-03-09 MED ORDER — QUETIAPINE FUMARATE ER 50 MG PO TB24
150.0000 mg | ORAL_TABLET | Freq: Every day | ORAL | Status: DC
Start: 2023-03-09 — End: 2023-03-10
  Administered 2023-03-09: 150 mg via ORAL

## 2023-03-09 MED ORDER — NICOTINE 14 MG/24HR TD PT24
14.0000 mg | MEDICATED_PATCH | Freq: Every day | TRANSDERMAL | Status: DC
  Administered 2023-03-09: 14 mg via TRANSDERMAL
  Filled 2023-03-09: qty 1

## 2023-03-09 MED ORDER — TRAZODONE HCL 100 MG PO TABS
100.0000 mg | ORAL_TABLET | Freq: Every day | ORAL | Status: DC
  Filled 2023-03-09: qty 1

## 2023-03-09 MED ORDER — QUETIAPINE FUMARATE ER 50 MG PO TB24
150.0000 mg | ORAL_TABLET | Freq: Every day | ORAL | Status: DC
  Filled 2023-03-09: qty 3

## 2023-03-09 NOTE — ED Notes (Signed)
 Pt changed into scrubs and moved to 2B/Triage 6. Will call security to wand.

## 2023-03-09 NOTE — Consult Note (Signed)
 [facility] Psychiatric Consult Initial  Patient Name: .Dylan [name]  MRN: [id number]  DOB: 10/11/1965  Consult Order details:  Orders (From admission, onward)     Start     Ordered   03/09/23 1214  CONSULT TO CALL ACT TEAM       Ordering Provider: Maxwell Marion, PA-C  Provider:  (Not yet assigned)  Question:  Reason for Consult?  Answer:  Psych consult   03/09/23 1214             Mode of Visit: In person    Psychiatry Consult Evaluation  Service Date: March 09, 2023 LOS:  LOS: 0 days  Chief Complaint "I was feeling depressed"  Primary Psychiatric Diagnoses  Suicidal Ideation  Assessment  Dylan [name] is a 58 y.o. male admitted: Presented to the EDfor 03/09/2023 10:35 AM for brought in by police due to trying to commit suicide by walking into traffic. He carries the psychiatric diagnoses of polysubstance abuse, bipolar disorder, PTSD and GAD and has a past medical history of HIV, HTN, HepB, liver disease, degenerative disk disease, peripheral neuropathy, dyslipidemia, prediabetes, chronic fatigue and left total knee replacement.   His current presentation of suicidal ideation with intent and a plan is most consistent with depression. He meets criteria for inpatient psychiatric hospitalization based on being a danger to himself.  Current outpatient psychotropic medications include seroquel and trazodone and historically he has had a positive response to these medications. He was not compliant with medications prior to admission as evidenced by patient report that he "wasn't always taking my medications." On initial examination, patient is co-operative and with a depressed affect. Please see plan below for detailed recommendations.   Diagnoses:  Active Hospital problems: Principal Problem:   Suicidal ideation    Plan   ## Psychiatric Medication Recommendations:  -- Seroquel XR 150mg  PO Q HS -- Trazodone 100mg  PO Q HS  ## Medical Decision Making Capacity: Not  specifically addressed in this encounter  ## Further Work-up:  -- Pending updated EKG and UDS -- most recent EKG on 12/05/22 had QtC of 454 -- Pertinent labwork reviewed earlier this admission includes: CBC, CMP, acetaminophen level, salicylate, and alcohol level   ## Disposition:-- We recommend inpatient psychiatric hospitalization after medical hospitalization. Patient has been involuntarily committed on 03/09/23.   ## Behavioral / Environmental: -Utilize compassion and acknowledge the patient's experiences while setting clear and realistic expectations for care.    ## Safety and Observation Level:  - Based on my clinical evaluation, I estimate the patient to be at high risk of self harm in the current setting. - At this time, we recommend  1:1 Observation. This decision is based on my review of the chart including patient's history and current presentation, interview of the patient, mental status examination, and consideration of suicide risk including evaluating suicidal ideation, plan, intent, suicidal or self-harm behaviors, risk factors, and protective factors. This judgment is based on our ability to directly address suicide risk, implement suicide prevention strategies, and develop a safety plan while the patient is in the clinical setting. Please contact our team if there is a concern that risk level has changed.  CSSR Risk Category:C-SSRS RISK CATEGORY: High Risk  Suicide Risk Assessment: Patient has following modifiable risk factors for suicide: active suicidal ideation, under treated depression , and medication noncompliance, which we are addressing by recommending inpatient psychiatric hospitalization. Patient has following non-modifiable or demographic risk factors for suicide: male gender, history of suicide attempt, and psychiatric hospitalization Patient  has the following protective factors against suicide: Supportive family  Thank you for this consult request.  Recommendations have been communicated to the primary team.  We will continue to follow at this time.   Thomes Lolling, NP       History of Present Illness  Relevant Aspects of Hospital ED Course:  Admitted on 03/09/2023 for brought in by police due to trying to commit suicide by walking into traffic. He carries the psychiatric diagnoses of polysubstance abuse, bipolar disorder, PTSD and GAD and has a past medical history of HIV, HTN, HepB, liver disease, degenerative disk disease, peripheral neuropathy, dyslipidemia, prediabetes, chronic fatigue and left total knee replacement.   Patient Report:  Dylan [name], is seen face to face by this provider, consulted with Dr. Enedina Finner; and chart reviewed on 03/09/23.  On evaluation Dylan [name] reports he is feeling depressed and wants to kill himself.  He reports struggling with these feelings many times. He says he thinks this is because "I just keep failing at different stuff."  He reports his brother says these are normal life things, but the patient doesn't experience it that way. Patient does not elaborate on what stuff he is failing.  He says he doesn't sleep well and often will go a couple days without sleep. Patient states he needs his seroquel to sleep.  He reports he was switched to abilify last time he was in the hospital and he didn't like it; he says without seroquel his "body won't turn off."  He says he won't take it again.    Patient lives with a room mate.  He has 1 brother and 2 sisters; he says he is close to his brother.  He had one child a daughter and she is deceased.  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  He is currently on disability, but says he still works occasionally.  He says the screws in his foot make it difficult to work much.   During evaluation Dylan [name] is laying in bed in no acute distress.  Heis alert & oriented x  4, calm, cooperative and attentive for this assessment.  His mood is depressed with congruent affect.  He has normal speech, and behavior.  Objectively there is evidence of psychosis, mania and delusional thinking; patient reports hearing voices, not sleeping for days at a time and feeling paranoid. Patient does not appear to be responding to internal or external stimuli.  Patient is able to converse coherently, goal directed thoughts, no distractibility, or pre-occupation.  He denies homicidal ideation; he endorses suicidal/self-harm, psychosis, and paranoia.  Patient answered questions appropriately.    Psych ROS:  Depression: endorses Anxiety:  endorses Mania (lifetime and current): lifetime and current Psychosis: (lifetime and current): lifetime and current   Review of Systems  Constitutional:  Positive for malaise/fatigue.  Musculoskeletal:  Positive for back pain and myalgias.  Psychiatric/Behavioral:  Positive for depression, hallucinations, substance abuse and suicidal ideas.   All other systems reviewed and are negative.    Psychiatric and Social History  Psychiatric History:  Information collected from patient  Prev Dx/Sx: polysubstance abuse, bipolar disorder, PTSD and GAD Current Psych Provider: none  Home Meds (current): seroquel, trazodone Previous Med Trials: many Therapy: endorses  Prior Psych Hospitalization: yes, many  Prior Self Harm: multiple previous attempts - OD, held gun to head, plan to jump off parking  deck Prior Violence: denies  Family Psych History: Mom - mental issues, doesn't know dx; Uncle - schizophrenia Family Hx suicide: denies  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 Hx: unknown Living Situation: Lives with a room mate Spiritual Hx: none noted Access to weapons/lethal means: denies   Substance  History Alcohol: denies all Tobacco: denies all Illicit drugs: denies all - pending UDS Prescription drug abuse: denies all - pending UDS Rehab hx: unknown  Exam Findings  Physical Exam:  Vital Signs:  Temp:  [98.3 F (36.8 C)] 98.3 F (36.8 C) (02/28 1038) Pulse Rate:  [88] 88 (02/28 1038) Resp:  [16] 16 (02/28 1038) BP: (125)/(87) 125/87 (02/28 1038) SpO2:  [99 %-100 %] 100 % (02/28 1140) Weight:  [104.3 kg] 104.3 kg (02/28 1042) Blood pressure 125/87, pulse 88, temperature 98.3 F (36.8 C), resp. rate 16, height 6\' 2"  (1.88 m), weight 104.3 kg, SpO2 100%. Body mass index is 29.53 kg/m.  Physical Exam Vitals and nursing note reviewed.  Eyes:     Pupils: Pupils are equal, round, and reactive to light.  Pulmonary:     Effort: Pulmonary effort is normal.  Skin:    General: Skin is dry.  Neurological:     Mental Status: He is alert and oriented to person, place, and time.  Psychiatric:        Attention and Perception: He perceives auditory hallucinations.        Mood and Affect: Mood is depressed.        Speech: Speech normal.        Behavior: Behavior is cooperative.        Thought Content: Thought content is paranoid. Thought content includes suicidal ideation. Thought content includes suicidal plan.        Cognition and Memory: Cognition and memory normal.     Mental Status Exam: General Appearance: Casual  Orientation:  Full (Time, Place, and Person)  Memory:  Immediate;   Good Recent;   Good Remote;   Good  Concentration:  Concentration: Fair  Recall:  Fair  Attention  Fair  Eye Contact:  Good  Speech:  Clear and Coherent  Language:  Good  Volume:  Normal  Mood: Depressed  Affect:  Depressed  Thought Process:  Coherent  Thought Content:  Hallucinations: Auditory  Suicidal Thoughts:  Yes.  with intent/plan  Homicidal Thoughts:  No  Judgement:  Impaired  Insight:  Shallow  Psychomotor Activity:  Normal  Akathisia:  No  Fund of Knowledge:  Fair       Assets:  Communication Skills Desire for Improvement Leisure Time  Cognition:  WNL  ADL's:  Intact  AIMS (if indicated):        Other History   These have been pulled in through the EMR, reviewed, and updated if appropriate.  Family History:  The patient's family history includes CAD in his mother; Colon cancer (age of onset: 64) in his maternal aunt; Diabetes in his father and maternal aunt; Heart attack in his maternal uncle; Heart disease in his maternal uncle; Stroke in his father.  Medical History: Past Medical History:  Diagnosis Date   Anxiety    Arthritis    Bipolar 1 disorder (HCC)    Colon polyps    Depression    GERD (gastroesophageal reflux disease)    Hepatitis B    Human immunodeficiency virus (HIV) (  HCC)    Hyperlipidemia    Hypertension    Insomnia due to other mental disorder 02/03/2020   Intentional drug overdose (HCC) 11/26/2021   Neuromuscular disorder (HCC)    neuropathy   Neuropathy    Pre-diabetes    Rhabdomyolysis 02/19/2017   Unilateral primary osteoarthritis, left knee 12/14/2021    Surgical History: Past Surgical History:  Procedure Laterality Date   FOOT ARTHRODESIS Right 09/17/2019   Procedure: FUSION RIGHT LISFRANC JOINT;  Surgeon: Nadara Mustard, MD;  Location: [facility] OR;  Service: Orthopedics;  Laterality: Right;   FOOT ARTHRODESIS Right 09/2019   HEMORROIDECTOMY     IR FLUORO GUIDE CV LINE RIGHT  02/22/2017   IR US GUIDE VASC ACCESS RIGHT  02/22/2017   TOTAL KNEE ARTHROPLASTY Left 12/14/2021   Procedure: LEFT TOTAL KNEE ARTHROPLASTY;  Surgeon: Nadara Mustard, MD;  Location[facility] OR;  Service: Orthopedics;  Laterality: Left;   TUMOR REMOVAL     From Chest     Medications:  No current facility-administered medications for this encounter.  Current Outpatient Medications:    bictegravir-emtricitabine-tenofovir AF (BIKTARVY) 50-200-25 MG TABS tablet, Take 1 tablet by mouth daily., Disp: 30 tablet, Rfl: 5   gabapentin (NEURONTIN) 300 MG  capsule, Take 1 capsule (300 mg total) by mouth 3 (three) times daily., Disp: 90 capsule, Rfl: 3   naproxen sodium (ALEVE) 220 MG tablet, Take 220-440 mg by mouth 2 (two) times daily as needed (Pain)., Disp: , Rfl:    QUEtiapine Fumarate (SEROQUEL XR) 150 MG 24 hr tablet, Take 150 mg by mouth at bedtime., Disp: , Rfl:    rosuvastatin (CRESTOR) 10 MG tablet, Take 1 tablet (10 mg total) by mouth daily., Disp: 30 tablet, Rfl: 11   traZODone (DESYREL) 100 MG tablet, Take 1 tablet (100 mg total) by mouth at bedtime as needed for sleep., Disp: 30 tablet, Rfl: 0   rivaroxaban (XARELTO) 10 MG TABS tablet, Take 1 tablet (10 mg total) by mouth daily. (Patient not taking: Reported on 03/09/2023), Disp: 30 tablet, Rfl: 1  Allergies: Allergies  Allergen Reactions   Penicillins Other (See Comments)    Reaction type/severity unknown, childhood allergy   Latex Rash   Tape Rash    Thomes Lolling, NP

## 2023-03-09 NOTE — ED Triage Notes (Signed)
 Pt states he was brought to ED by PD d/t trying to commit suicide by walking into traffic. Pt endorses suicidal ideations, denies HI. Pt states he occasionally has auditory hallucinations, none currently. Denies visual hallucinations.

## 2023-03-09 NOTE — ED Notes (Signed)
 Waiting on officer to serve Custody Order for [employer] and First Exam accepted with File No. [id number]

## 2023-03-09 NOTE — Clinical Social Work Placement (Addendum)
 LCSW Progression Note:   Dylan [name] [id number] 03/09/2023 3:59am  Valarie Cones, Leavy Cella, NP, recommended inpatient treatment. Patient was involuntarily committed on 03/09/23 and referred to [facility] Landry Dyke, RN) for admission review.

## 2023-03-09 NOTE — ED Provider Triage Note (Signed)
 Emergency Medicine Provider Triage Evaluation Note  Dylan [name] , a 58 y.o. male  was evaluated in triage.  Pt complains of si.  Pt was brought the the ED by the police because he was trying to commit suicide by walking into traffic.  He denies any ingestions.  He just feels very sad.  Review of Systems  Positive: +si Negative: No cp, abd pain, n/v  Physical Exam  BP 125/87 (BP Location: Right Arm)   Pulse 88   Temp 98.3 F (36.8 C)   Resp 16   Ht 6\' 2"  (1.88 m)   Wt 104.3 kg   SpO2 99%   BMI 29.53 kg/m  Gen:   Awake, no distress   Resp:  Normal effort  MSK:   Moves extremities without difficulty  Other:  +SI  Medical Decision Making  Medically screening exam initiated at 11:00 AM.  Appropriate orders placed.  Dylan [name] was informed that the remainder of the evaluation will be completed by another provider, this initial triage assessment does not replace that evaluation, and the importance of remaining in the ED until their evaluation is complete.  Medical clearance labs placed.   Jacalyn Lefevre, MD 03/09/23 1101

## 2023-03-09 NOTE — ED Provider Notes (Signed)
 [place] EMERGENCY DEPARTMENT AT [facility] Provider Note   CSN: [id number] Arrival date & time: 03/09/23  1005     History  Chief Complaint  Patient presents with   Suicide Attempt    Dylan [name] is a 58 y.o. male with a history of HIV, bipolar 1 disorder, and hepatitis B who presents the Dylan today after suicide attempt.  Patient was brought in by [employer] after being found walking into traffic trying to kill himself.  He states that he has "a lot on his mind" and has thoughts of killing himself.  Denies thoughts of hurting others.  Additionally, he stopped taking his bipolar medication recently and has been having auditory hallucinations.  No additional complaints or concerns at this time.    Home Medications Prior to Admission medications   Medication Sig Start Date End Date Taking? Authorizing Provider  bictegravir-emtricitabine-tenofovir AF (BIKTARVY) 50-200-25 MG TABS tablet Take 1 tablet by mouth daily. 01/18/23  Yes Judyann Munson, MD  gabapentin (NEURONTIN) 300 MG capsule Take 1 capsule (300 mg total) by mouth 3 (three) times daily. 12/01/22  Yes Hoy Register, MD  naproxen sodium (ALEVE) 220 MG tablet Take 220-440 mg by mouth 2 (two) times daily as needed (Pain).   Yes [provider]  QUEtiapine Fumarate (SEROQUEL XR) 150 MG 24 hr tablet Take 150 mg by mouth at bedtime.   Yes [provider]  rosuvastatin (CRESTOR) 10 MG tablet Take 1 tablet (10 mg total) by mouth daily. 02/14/23  Yes Judyann Munson, MD  traZODone (DESYREL) 100 MG tablet Take 1 tablet (100 mg total) by mouth at bedtime as needed for sleep. 12/10/22  Yes Thalia Party, MD  rivaroxaban (XARELTO) 10 MG TABS tablet Take 1 tablet (10 mg total) by mouth daily. Patient not taking: Reported on 03/09/2023 01/08/23   Barnetta Chapel, MD      Allergies    Penicillins, Latex, and Tape    Review of Systems   Review of Systems  Psychiatric/Behavioral:  Positive for suicidal ideas.   All  other systems reviewed and are negative.   Physical Exam Updated Vital Signs BP 125/87 (BP Location: Right Arm)   Pulse 88   Temp 98.3 F (36.8 C)   Resp 16   Ht 6\' 2"  (1.88 m)   Wt 104.3 kg   SpO2 100%   BMI 29.53 kg/m  Physical Exam Vitals and nursing note reviewed.  Constitutional:      General: He is not in acute distress.    Appearance: Normal appearance.  HENT:     Head: Normocephalic and atraumatic.     Mouth/Throat:     Mouth: Mucous membranes are moist.  Eyes:     Conjunctiva/sclera: Conjunctivae normal.     Pupils: Pupils are equal, round, and reactive to light.  Cardiovascular:     Rate and Rhythm: Normal rate and regular rhythm.     Pulses: Normal pulses.     Heart sounds: Normal heart sounds.  Pulmonary:     Effort: Pulmonary effort is normal.     Breath sounds: Normal breath sounds.  Abdominal:     Palpations: Abdomen is soft.     Tenderness: There is no abdominal tenderness.  Musculoskeletal:        General: Normal range of motion.     Cervical back: Normal range of motion.  Skin:    General: Skin is warm and dry.     Findings: No rash.  Neurological:  General: No focal deficit present.     Mental Status: He is alert.  Psychiatric:        Mood and Affect: Mood normal.        Behavior: Behavior normal.    Dylan Results / Procedures / Treatments   Labs (all labs ordered are listed, but only abnormal results are displayed) Labs Reviewed  COMPREHENSIVE METABOLIC PANEL - Abnormal; Notable for the following components:      Result Value   BUN 25 (*)    AST 103 (*)    ALT 49 (*)    All other components within normal limits  SALICYLATE LEVEL - Abnormal; Notable for the following components:   Salicylate Lvl <7.0 (*)    All other components within normal limits  ACETAMINOPHEN LEVEL - Abnormal; Notable for the following components:   Acetaminophen (Tylenol), Serum <10 (*)    All other components within normal limits  ETHANOL  CBC  RAPID URINE  DRUG SCREEN, HOSP PERFORMED    EKG None  Radiology No results found.  Procedures Procedures: not indicated.   Medications Ordered in Dylan Medications - No data to display  Dylan Course/ Medical Decision Making/ A&P                                 Medical Decision Making Amount and/or Complexity of Data Reviewed Labs: ordered.   This patient presents to the Dylan for concern of suicide attempt, this involves an extensive number of treatment options, and is a complaint that carries with it a high risk of complications and morbidity.   Differential diagnosis includes: SI, HI, acute psychosis, acute substance intoxication, substance withdrawal, etc.   Comorbidities  See HPI above   Additional History  Additional history obtained from prior records   Lab Tests  I ordered and personally interpreted labs.  The pertinent results include:   CMP and CBC within normal limits - patient has elevated AST and ALT at baseline Ethanol levels less than 10 unremarkable acetaminophen and salicylate levels   Problem List / Dylan Course / Critical Interventions / Medication Management  Patient presents to the Dylan after being brought in by [employer] after being found walking in traffic, trying to kill himself.  He states that he does not want to live anymore due to multiple factors in his life.  Denies HI or visual hallucinations but does have auditory hallucinations.  He has a history of bipolar disorder but stopped taking his medications recently.  He denies any other complaints or concerns at this time. Patient is under IVC hold, due to being a harm to himself. Patient is medically cleared from an Dylan standpoint. TTS consulted.   Social Determinants of Health  Mental health   Test / Admission - Considered  Patient is stable and cleared for TTS consult.       Final Clinical Impression(s) / Dylan Diagnoses Final diagnoses:  Suicide attempt [facility])    Rx / DC Orders Dylan Discharge Orders      None         Maxwell Marion, PA-C 03/09/23 1358    Gwyneth Sprout, MD 03/11/23 1110

## 2023-03-09 NOTE — ED Notes (Signed)
 IVC File # [id number] IVC'd 03/09/23, exp 03/16/23 IVC docs taken to [facility] [place], copies made and filed as required

## 2023-03-09 NOTE — ED Notes (Signed)
Belongings placed in locker number 3 

## 2023-03-09 NOTE — ED Notes (Signed)
IVC in process  

## 2023-03-10 ENCOUNTER — Encounter (HOSPITAL_COMMUNITY): Payer: Self-pay | Admitting: Psychiatry

## 2023-03-10 ENCOUNTER — Inpatient Hospital Stay (HOSPITAL_COMMUNITY)
Admission: AD | Admit: 2023-03-10 | Discharge: 2023-03-14 | DRG: 885 | Disposition: A | Discharge status: Home / Self Care | Payer: MEDICAID | Source: Intra-hospital | Attending: Psychiatry | Admitting: Psychiatry

## 2023-03-10 DIAGNOSIS — E785 Hyperlipidemia, unspecified: Secondary | ICD-10-CM | POA: Diagnosis present

## 2023-03-10 DIAGNOSIS — F333 Major depressive disorder, recurrent, severe with psychotic symptoms: Secondary | ICD-10-CM | POA: Diagnosis present

## 2023-03-10 DIAGNOSIS — Z9151 Personal history of suicidal behavior: Secondary | ICD-10-CM

## 2023-03-10 DIAGNOSIS — B20 Human immunodeficiency virus [HIV] disease: Secondary | ICD-10-CM | POA: Diagnosis present

## 2023-03-10 DIAGNOSIS — Z1152 Encounter for screening for COVID-19: Secondary | ICD-10-CM

## 2023-03-10 DIAGNOSIS — I10 Essential (primary) hypertension: Secondary | ICD-10-CM | POA: Diagnosis present

## 2023-03-10 DIAGNOSIS — Z91198 Patient's noncompliance with other medical treatment and regimen for other reason: Secondary | ICD-10-CM

## 2023-03-10 DIAGNOSIS — F17210 Nicotine dependence, cigarettes, uncomplicated: Secondary | ICD-10-CM | POA: Diagnosis present

## 2023-03-10 DIAGNOSIS — K219 Gastro-esophageal reflux disease without esophagitis: Secondary | ICD-10-CM | POA: Diagnosis present

## 2023-03-10 DIAGNOSIS — K5900 Constipation, unspecified: Secondary | ICD-10-CM | POA: Diagnosis present

## 2023-03-10 DIAGNOSIS — R45851 Suicidal ideations: Principal | ICD-10-CM

## 2023-03-10 DIAGNOSIS — F17290 Nicotine dependence, other tobacco product, uncomplicated: Secondary | ICD-10-CM | POA: Diagnosis present

## 2023-03-10 DIAGNOSIS — Z8249 Family history of ischemic heart disease and other diseases of the circulatory system: Secondary | ICD-10-CM | POA: Diagnosis not present

## 2023-03-10 DIAGNOSIS — Z96652 Presence of left artificial knee joint: Secondary | ICD-10-CM | POA: Diagnosis present

## 2023-03-10 DIAGNOSIS — Z7901 Long term (current) use of anticoagulants: Secondary | ICD-10-CM

## 2023-03-10 DIAGNOSIS — F411 Generalized anxiety disorder: Secondary | ICD-10-CM | POA: Diagnosis present

## 2023-03-10 DIAGNOSIS — Z823 Family history of stroke: Secondary | ICD-10-CM

## 2023-03-10 DIAGNOSIS — F4310 Post-traumatic stress disorder, unspecified: Secondary | ICD-10-CM | POA: Diagnosis present

## 2023-03-10 DIAGNOSIS — F17200 Nicotine dependence, unspecified, uncomplicated: Secondary | ICD-10-CM | POA: Diagnosis present

## 2023-03-10 DIAGNOSIS — Z5982 Transportation insecurity: Secondary | ICD-10-CM

## 2023-03-10 DIAGNOSIS — Z716 Tobacco abuse counseling: Secondary | ICD-10-CM

## 2023-03-10 DIAGNOSIS — Z79899 Other long term (current) drug therapy: Secondary | ICD-10-CM

## 2023-03-10 DIAGNOSIS — E559 Vitamin D deficiency, unspecified: Secondary | ICD-10-CM | POA: Diagnosis present

## 2023-03-10 DIAGNOSIS — Z9152 Personal history of nonsuicidal self-harm: Secondary | ICD-10-CM

## 2023-03-10 DIAGNOSIS — G629 Polyneuropathy, unspecified: Secondary | ICD-10-CM | POA: Diagnosis present

## 2023-03-10 DIAGNOSIS — F1910 Other psychoactive substance abuse, uncomplicated: Secondary | ICD-10-CM | POA: Diagnosis present

## 2023-03-10 DIAGNOSIS — Z833 Family history of diabetes mellitus: Secondary | ICD-10-CM

## 2023-03-10 DIAGNOSIS — G4700 Insomnia, unspecified: Secondary | ICD-10-CM | POA: Diagnosis present

## 2023-03-10 DIAGNOSIS — R7303 Prediabetes: Secondary | ICD-10-CM | POA: Diagnosis present

## 2023-03-10 DIAGNOSIS — Z981 Arthrodesis status: Secondary | ICD-10-CM

## 2023-03-10 MED ORDER — ALUM & MAG HYDROXIDE-SIMETH 200-200-20 MG/5ML PO SUSP
30.0000 mL | ORAL | Status: DC | PRN
  Administered 2023-03-12: 30 mL via ORAL
  Filled 2023-03-10: qty 30

## 2023-03-10 MED ORDER — NICOTINE 14 MG/24HR TD PT24
14.0000 mg | MEDICATED_PATCH | Freq: Every day | TRANSDERMAL | Status: DC
  Administered 2023-03-10 – 2023-03-14 (×5): 14 mg via TRANSDERMAL
  Filled 2023-03-10 (×7): qty 1

## 2023-03-10 MED ORDER — ROSUVASTATIN CALCIUM 10 MG PO TABS
10.0000 mg | ORAL_TABLET | Freq: Every day | ORAL | Status: DC
  Administered 2023-03-10 – 2023-03-14 (×5): 10 mg via ORAL
  Filled 2023-03-10 (×6): qty 1

## 2023-03-10 MED ORDER — QUETIAPINE FUMARATE ER 50 MG PO TB24
150.0000 mg | ORAL_TABLET | Freq: Every day | ORAL | Status: DC
  Administered 2023-03-10 – 2023-03-12 (×3): 150 mg via ORAL
  Filled 2023-03-10 (×5): qty 3

## 2023-03-10 MED ORDER — DIPHENHYDRAMINE HCL 25 MG PO CAPS
50.0000 mg | ORAL_CAPSULE | Freq: Three times a day (TID) | ORAL | Status: DC | PRN
  Administered 2023-03-13: 50 mg via ORAL
  Filled 2023-03-10: qty 2

## 2023-03-10 MED ORDER — DIPHENHYDRAMINE HCL 50 MG/ML IJ SOLN: 50.0000 mg | Freq: Three times a day (TID) | INTRAMUSCULAR | Status: DC | PRN

## 2023-03-10 MED ORDER — LORAZEPAM 2 MG/ML IJ SOLN: 2.0000 mg | Freq: Three times a day (TID) | INTRAMUSCULAR | Status: DC | PRN

## 2023-03-10 MED ORDER — HALOPERIDOL LACTATE 5 MG/ML IJ SOLN: 10.0000 mg | Freq: Three times a day (TID) | INTRAMUSCULAR | Status: DC | PRN

## 2023-03-10 MED ORDER — MAGNESIUM HYDROXIDE 400 MG/5ML PO SUSP
30.0000 mL | Freq: Every day | ORAL | Status: DC | PRN
Start: 2023-03-10 — End: 2023-03-14

## 2023-03-10 MED ORDER — ACETAMINOPHEN 325 MG PO TABS
650.0000 mg | ORAL_TABLET | Freq: Four times a day (QID) | ORAL | Status: DC | PRN
  Administered 2023-03-10 – 2023-03-13 (×4): 650 mg via ORAL
  Filled 2023-03-10 (×3): qty 2

## 2023-03-10 MED ORDER — GABAPENTIN 300 MG PO CAPS
300.0000 mg | ORAL_CAPSULE | Freq: Three times a day (TID) | ORAL | Status: DC
  Administered 2023-03-10 – 2023-03-13 (×9): 300 mg via ORAL
  Filled 2023-03-10 (×14): qty 1

## 2023-03-10 MED ORDER — HALOPERIDOL LACTATE 5 MG/ML IJ SOLN: 5.0000 mg | Freq: Three times a day (TID) | INTRAMUSCULAR | Status: DC | PRN

## 2023-03-10 MED ORDER — HALOPERIDOL 5 MG PO TABS
5.0000 mg | ORAL_TABLET | Freq: Three times a day (TID) | ORAL | Status: DC | PRN
  Administered 2023-03-13: 5 mg via ORAL
  Filled 2023-03-10: qty 1

## 2023-03-10 MED ORDER — BICTEGRAVIR-EMTRICITAB-TENOFOV 50-200-25 MG PO TABS
1.0000 | ORAL_TABLET | Freq: Every day | ORAL | Status: DC
  Administered 2023-03-10 – 2023-03-14 (×5): 1 via ORAL
  Filled 2023-03-10 (×6): qty 1

## 2023-03-10 MED ORDER — TRAZODONE HCL 100 MG PO TABS
100.0000 mg | ORAL_TABLET | Freq: Every day | ORAL | Status: DC
  Administered 2023-03-10 – 2023-03-11 (×2): 100 mg via ORAL
  Filled 2023-03-10 (×5): qty 1

## 2023-03-10 NOTE — [facility] Suicide Risk Assessment (Signed)
 Suicide Risk Assessment  Admission Assessment    [facility] Admission Suicide Risk Assessment   Nursing information obtained from:    Demographic factors:    Current Mental Status:    Loss Factors:    Historical Factors:    Risk Reduction Factors:     Total Time spent with patient: 20 minutes Principal Problem: Suicidal ideations Diagnosis:  Principal Problem:   Suicidal ideations  Subjective Data: Dylan [name] is a 58 year old male with a reported history of bipolar disorder, PTSD, MDD, GAD, and polysubstance use disorder as well as a medical history of HIV, HTN, and GERD who was admitted involuntarily to [name], ED for suicidal attempt by walking into traffic at the instructing of command auditory hallucinations.    Per ED notes: "Jaquavis Felmlee, is seen face to face by this provider, consulted with Dr. Enedina Finner; and chart reviewed on 03/09/23.  On evaluation Gabor Lusk reports he is feeling depressed and wants to kill himself.  He reports struggling with these feelings many times. He says he thinks this is because "I just keep failing at different stuff."  He reports his brother says these are normal life things, but the patient doesn't experience it that way. Patient does not elaborate on what stuff he is failing.  He says he doesn't sleep well and often will go a couple days without sleep. Patient states he needs his seroquel to sleep.  He reports he was switched to abilify last time he was in the hospital and he didn't like it; he says without seroquel his "body won't turn off."  He says he won't take it again.     Patient lives with a room mate.  He has 1 brother and 2 sisters; he says he is close to his brother.  He had one child a daughter and she is deceased.  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  He is currently on disability, but says he still works occasionally.  He  says the screws in his foot make it difficult to work much."   Continued Clinical Symptoms:    The "Alcohol Use Disorders Identification Test", Guidelines for Use in Primary Care, Second Edition.  World Science writer [facility]). Score between 0-7:  no or low risk or alcohol related problems. Score between 8-15:  moderate risk of alcohol related problems. Score between 16-19:  high risk of alcohol related problems. Score 20 or above:  warrants further diagnostic evaluation for alcohol dependence and treatment.   CLINICAL FACTORS:   Bipolar Disorder:   Depressive phase Depression:   Comorbid alcohol abuse/dependence Impulsivity Insomnia Alcohol/Substance Abuse/Dependencies More than one psychiatric diagnosis Currently Psychotic Unstable or Poor Therapeutic Relationship Previous Psychiatric Diagnoses and Treatments Medical Diagnoses and Treatments/Surgeries   Musculoskeletal: Strength & Muscle Tone:  Unable to assess Gait & Station:  Unable to assess Patient leans:  Unable to assess  Psychiatric Specialty Exam:  Presentation  General Appearance:  Appropriate for Environment; Casual  Eye Contact: Absent  Speech: Slow; Garbled  Speech Volume: Decreased  Handedness: -- (not assessed)   Mood and Affect  Mood: Depressed  Affect: Other (comment) (Unable to assess, as patient is in and out of sleep)   Thought Process  Thought Processes: Other (comment) (Unable to assess)  Descriptions of Associations:-- (Unable to assess)  Orientation:Other (comment) (Unable to assess)  Thought Content:-- (Unable to assess)  History of Schizophrenia/Schizoaffective disorder:No  Duration of Psychotic Symptoms:N/A  Hallucinations:Hallucinations: Auditory; Command Description of Auditory Hallucinations: Chronic  Ideas of Reference:None  Suicidal Thoughts:Suicidal Thoughts: No  Homicidal Thoughts:Homicidal Thoughts: No   Sensorium  Memory: Other (comment) (Unable  to assess)  Judgment: Impaired  Insight: -- (Unable to assess)   Executive Functions  Concentration: Other (comment)  Attention Span: Other (comment)  Recall: Other (comment)  Fund of Knowledge: Other (comment)  Language: Other (comment)   Psychomotor Activity  Psychomotor Activity: Psychomotor Activity: Decreased   Assets  Assets: Communication Skills; Desire for Improvement; Resilience   Sleep  Sleep: Sleep: -- (Unable to assess)    Physical Exam: Physical Exam ROS Blood pressure 107/65, pulse 82, temperature 97.8 F (36.6 C), temperature source Oral, resp. rate 18, height 6\' 2"  (1.88 m), weight 104.3 kg, SpO2 97%. Body mass index is 29.52 kg/m.   COGNITIVE FEATURES THAT CONTRIBUTE TO RISK:  Loss of executive function    SUICIDE RISK:   Moderate:  Frequent suicidal ideation with limited intensity, and duration, some specificity in terms of plans, no associated intent, good self-control, limited dysphoria/symptomatology, some risk factors present, and identifiable protective factors, including available and accessible social support.  PLAN OF CARE: Per H&P  I certify that inpatient services furnished can reasonably be expected to improve the patient's condition.   Lamar Sprinkles, MD 03/10/2023, 8:24 PM

## 2023-03-10 NOTE — Plan of Care (Signed)
   Problem: Education: Goal: Knowledge of [facility] Education information/materials will improve Outcome: Progressing Goal: Verbalization of understanding the information provided will improve Outcome: Progressing

## 2023-03-10 NOTE — Progress Notes (Signed)
   03/10/23 2000  Psych Admission Type (Psych Patients Only)  Admission Status Involuntary  Psychosocial Assessment  Patient Complaints Depression  Eye Contact Brief  Facial Expression Sullen  Affect Depressed;Sad;Constricted  Speech Logical/coherent  Interaction Assertive  Motor Activity Slow  Appearance/Hygiene Unremarkable  Behavior Characteristics Calm  Mood Depressed;Despair  Thought Process  Coherency WDL  Content WDL  Delusions None reported or observed  Perception WDL  Hallucination None reported or observed  Judgment WDL  Confusion None  Danger to Self  Current suicidal ideation? Denies  Self-Injurious Behavior No self-injurious ideation or behavior indicators observed or expressed   Agreement Not to Harm Self Yes  Description of Agreement Verbal agreement to contract  Danger to Others  Danger to Others None reported or observed  Danger to Others Abnormal  Harmful Behavior to others No threats or harm toward other people  Destructive Behavior No threats or harm toward property

## 2023-03-10 NOTE — Plan of Care (Signed)
   Problem: Education: Goal: Knowledge of [facility] Education information/materials will improve Outcome: Progressing Goal: Emotional status will improve Outcome: Progressing Goal: Mental status will improve Outcome: Progressing Goal: Verbalization of understanding the information provided will improve Outcome: Progressing

## 2023-03-10 NOTE — H&P (Signed)
 Psychiatric Admission Assessment Adult  Patient Identification: Dylan [name] MRN:  [id number] Date of Evaluation:  03/10/2023 Chief Complaint:  Suicidal ideations [R45.851] Principal Diagnosis: Suicidal ideations Diagnosis:  Principal Problem:   Suicidal ideations  History of Present Illness: Dylan [name] is a 58 year old male with a reported history of bipolar disorder, PTSD, MDD, GAD, and polysubstance use disorder as well as a medical history of HIV, HTN, and GERD who was admitted involuntarily to [facility] from [name], ED for suicidal attempt by walking into traffic at the instructing of command auditory hallucinations.  On assessment today, patient seen at the bedside.  He was sleeping and refused to wake to engage in conversation.  He did confirm the suicide attempt and endorsed longstanding command auditory hallucinations.  He also denied SI, HI, and VH today.  When attempting to ask further questions, patient began snoring, discontinuing the assessment.  Per ED notes: "Dylan [name], is seen face to face by this provider, consulted with Dr. Enedina Finner; and chart reviewed on 03/09/23.  On evaluation Dylan [name] reports he is feeling depressed and wants to kill himself.  He reports struggling with these feelings many times. He says he thinks this is because "I just keep failing at different stuff."  He reports his brother says these are normal life things, but the patient doesn't experience it that way. Patient does not elaborate on what stuff he is failing.  He says he doesn't sleep well and often will go a couple days without sleep. Patient states he needs his seroquel to sleep.  He reports he was switched to abilify last time he was in the hospital and he didn't like it; he says without seroquel his "body won't turn off."  He says he won't take it again.     Patient lives with a room mate.  He has 1 brother and 2 sisters; he says he is close to his brother.  He had one child a daughter and she is  deceased.  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  He is currently on disability, but says he still works occasionally.  He says the screws in his foot make it difficult to work much.    During evaluation Dylan [name] is laying in bed in no acute distress.  Heis alert & oriented x 4, calm, cooperative and attentive for this assessment.  His mood is depressed with congruent affect.  He has normal speech, and behavior.  Objectively there is evidence of psychosis, mania and delusional thinking; patient reports hearing voices, not sleeping for days at a time and feeling paranoid. Patient does not appear to be responding to internal or external stimuli.  Patient is able to converse coherently, goal directed thoughts, no distractibility, or pre-occupation.  He denies homicidal ideation; he endorses suicidal/self-harm, psychosis, and paranoia.  Patient answered questions appropriately."      Associated Signs/Symptoms: Depression Symptoms:  depressed mood, suicidal attempt, anxiety, (Hypo) Manic Symptoms:   Unable to assess Anxiety Symptoms:   Unable to assess Psychotic Symptoms:  Hallucinations: Auditory Command:  Commanding him to walk into traffic PTSD Symptoms: Unable to assess Total Time spent with patient: 20 minutes  Past Psychiatric History: Prev Dx/Sx: polysubstance abuse, bipolar disorder, PTSD and GAD Current Psych Provider: none  Home Meds (current): seroquel, trazodone Previous Med Trials: many Therapy: endorses   Prior Psych Hospitalization: yes, many  Prior  Self Harm: multiple previous attempts - OD, held gun to head, plan to jump off parking deck Prior Violence: denies  Is the patient at risk to self? Yes.    Has the patient been a risk to self in the past 6 months? Yes.    Has the patient been a risk to self within the distant past? Yes.    Is the patient a risk to  others? No.  Has the patient been a risk to others in the past 6 months? No.  Has the patient been a risk to others within the distant past? No.   [place] Scale:  Flowsheet Row ED from 03/09/2023 in [facility] Emergency Department at [facility] ED from 02/05/2023 in [facility] Emergency Department at [facility] ED from 12/23/2022 in [facility] Emergency Department at [facility]  C-SSRS RISK CATEGORY High Risk No Risk No Risk        Prior Inpatient Therapy: Yes.   If yes, describe see above Prior Outpatient Therapy: No.  Alcohol Screening:   Substance Abuse History in the last 12 months:  Yes.   Consequences of Substance Abuse: Unable to assess Previous Psychotropic Medications: Yes  Psychological Evaluations: Yes  Past Medical History:  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   Intentional drug overdose (HCC) 11/26/2021   Neuromuscular disorder (HCC)    neuropathy   Neuropathy    Pre-diabetes    Rhabdomyolysis 02/19/2017   Unilateral primary osteoarthritis, left knee 12/14/2021    Past Surgical History:  Procedure Laterality Date   FOOT ARTHRODESIS Right 09/17/2019   Procedure: FUSION RIGHT LISFRANC JOINT;  Surgeon: Nadara Mustard, MD;  Location: [facility] OR;  Service: Orthopedics;  Laterality: Right;   FOOT ARTHRODESIS Right 09/2019   HEMORROIDECTOMY     IR FLUORO GUIDE CV LINE RIGHT  02/22/2017   IR US GUIDE VASC ACCESS RIGHT  02/22/2017   TOTAL KNEE ARTHROPLASTY Left 12/14/2021   Procedure: LEFT TOTAL KNEE ARTHROPLASTY;  Surgeon: Nadara Mustard, MD;  Location: [facility] OR;  Service: Orthopedics;  Laterality: Left;   TUMOR REMOVAL     From Chest   Family History:  Family History   Problem Relation Age of Onset   CAD Mother    Diabetes Father    Stroke Father    Colon cancer Maternal Aunt 60   Diabetes Maternal Aunt    Heart disease Maternal Uncle    Heart attack Maternal Uncle    Esophageal cancer Neg Hx    Rectal cancer Neg Hx    Stomach cancer Neg Hx    Family Psychiatric  History:  Mom - mental issues, doesn't know dx; Uncle - schizophrenia Family Hx suicide: denies Tobacco Screening:  Social History   Tobacco Use  Smoking Status Former   Current packs/day: 0.50   Average packs/day: 0.5 packs/day for 25.0 years (12.5 ttl pk-yrs)   Types: E-cigarettes, Cigarettes  Smokeless Tobacco Never  Tobacco Comments   Vapes daily    BH Tobacco Counseling     Are you interested in Tobacco Cessation Medications?  Yes, implement Nicotene Replacement Protocol Counseled patient on smoking cessation:  Yes Reason Tobacco Screening Not Completed: No value filed.       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 Hx: unknown Living Situation: Lives with a room mate Spiritual Hx: none noted Access to weapons/lethal means: denies    Substance History Alcohol: denies all Tobacco: denies all Illicit drugs: denies all - pending UDS Prescription drug abuse: denies all - pending UDS Rehab hx: unknown  Social History   Substance and Sexual Activity  Alcohol Use Yes   Comment: last drink March 2024     Social History   Substance and Sexual Activity  Drug Use Not Currently   Types: Cocaine, Marijuana   Comment: Last used cocaine March 2024; last used cannabis 2022    Additional Social History:                           Allergies:   Allergies  Allergen Reactions   Penicillins Other (See Comments)    Reaction type/severity unknown, childhood allergy   Latex Rash   Tape Rash   Lab Results:   Results for orders placed or performed during the hospital encounter of 03/09/23 (from the past 48 hours)  Comprehensive metabolic panel     Status: Abnormal   Collection Time: 03/09/23 10:46 AM  Result Value Ref Range   Sodium 136 135 - 145 mmol/L   Potassium 3.8 3.5 - 5.1 mmol/L   Chloride 101 98 - 111 mmol/L   CO2 25 22 - 32 mmol/L   Glucose, Bld 99 70 - 99 mg/dL    Comment: Glucose reference range applies only to samples taken after fasting for at least 8 hours.   BUN 25 (H) 6 - 20 mg/dL   Creatinine, Ser 1.61 0.61 - 1.24 mg/dL   Calcium 9.6 8.9 - 09.6 mg/dL   Total Protein 7.5 6.5 - 8.1 g/dL   Albumin 4.2 3.5 - 5.0 g/dL   AST 045 (H) 15 - 41 U/L   ALT 49 (H) 0 - 44 U/L   Alkaline Phosphatase 80 38 - 126 U/L   Total Bilirubin 0.8 0.0 - 1.2 mg/dL   GFR, Estimated >40 >98 mL/min    Comment: (NOTE) Calculated using the CKD-EPI Creatinine Equation (2021)    Anion gap 10 5 - 15    Comment: Performed at [facility], [address], [place], [place] [address]  Ethanol     Status: None   Collection Time: 03/09/23 10:46 AM  Result Value Ref Range   Alcohol, Ethyl (B) <10 <10 mg/dL    Comment: (NOTE) Lowest detectable limit for serum alcohol is 10 mg/dL.  For medical purposes only. Performed at [facility], [address], [place], [place] [address]   Salicylate level     Status: Abnormal   Collection Time: 03/09/23 10:46 AM  Result Value Ref Range   Salicylate Lvl <7.0 (L) 7.0 - 30.0 mg/dL    Comment: Performed at [facility], [address], [place], [place] [address]  Acetaminophen level     Status: Abnormal   Collection Time: 03/09/23 10:46 AM  Result Value Ref Range   Acetaminophen (Tylenol), Serum <10 (L) 10 - 30 ug/mL    Comment: (NOTE) Therapeutic concentrations vary significantly. A range of 10-30 ug/mL  may be an effective concentration for many patients. However,  some  are best treated at concentrations outside of this range. Acetaminophen  concentrations >150 ug/mL at 4 hours after ingestion  and >50 ug/mL at 12 hours after ingestion are often associated with  toxic reactions.  Performed at [facility], [address] [place], [place] [address]   cbc     Status: None   Collection Time: 03/09/23 10:46 AM  Result Value Ref Range   WBC 8.1 4.0 - 10.5 K/uL   RBC 4.50 4.22 - 5.81 MIL/uL   Hemoglobin 14.1 13.0 - 17.0 g/dL   HCT 65.7 84.6 - 96.2 %   MCV 91.3 80.0 - 100.0 fL   MCH 31.3 26.0 - 34.0 pg   MCHC 34.3 30.0 - 36.0 g/dL   RDW 95.2 84.1 - 32.4 %   Platelets 221 150 - 400 K/uL   nRBC 0.0 0.0 - 0.2 %    Comment: Performed at [facility], [address], [place], [place] [address]    Blood Alcohol level:  Lab Results  Component Value Date   [facility] <10 03/09/2023   ETH <10 12/04/2022    Metabolic Disorder Labs:  Lab Results  Component Value Date   HGBA1C 5.9 (H) 12/08/2022   MPG 122.63 12/08/2022   MPG 131 04/03/2022   Lab Results  Component Value Date   PROLACTIN 2.6 (L) 08/25/2022   PROLACTIN 4.8 03/30/2022   Lab Results  Component Value Date   CHOL 261 (H) 02/14/2023   TRIG 208 (H) 02/14/2023   HDL 53 02/14/2023   CHOLHDL 4.9 02/14/2023   VLDL 50 (H) 12/08/2022   LDLCALC 171 (H) 02/14/2023   LDLCALC 151 (H) 12/08/2022    Current Medications: Current Facility-Administered Medications  Medication Dose Route Frequency Provider Last Rate Last Admin   acetaminophen (TYLENOL) tablet 650 mg  650 mg Oral Q6H PRN Weber, Kyra A, NP   650 mg at 03/10/23 1001   alum & mag hydroxide-simeth (MAALOX/MYLANTA) 200-200-20 MG/5ML suspension 30 mL  30 mL Oral Q4H PRN Weber, Kyra A, NP       bictegravir-emtricitabine-tenofovir AF (BIKTARVY) 50-200-25 MG per tablet 1 tablet  1 tablet Oral Daily Weber, Kyra A, NP   1 tablet at 03/10/23 1001   haloperidol (HALDOL) tablet 5 mg  5 mg Oral TID PRN Weber, Kyra A, NP       And   diphenhydrAMINE (BENADRYL) capsule 50 mg  50 mg Oral TID PRN Weber, Kyra A, NP        haloperidol lactate (HALDOL) injection 5 mg  5 mg Intramuscular TID PRN Weber, Kyra A, NP       And   diphenhydrAMINE (BENADRYL) injection 50 mg  50 mg Intramuscular TID PRN Weber, Bella Kennedy A, NP       And   LORazepam (ATIVAN) injection 2 mg  2 mg Intramuscular TID PRN Weber, Kyra A, NP       haloperidol lactate (HALDOL) injection 10 mg  10 mg Intramuscular TID PRN Weber, Kyra A, NP       And   diphenhydrAMINE (BENADRYL) injection 50 mg  50 mg Intramuscular TID PRN Weber, Bella Kennedy A, NP       And   LORazepam (ATIVAN) injection 2 mg  2 mg Intramuscular TID PRN Weber, Kyra A, NP       gabapentin (NEURONTIN) capsule 300 mg  300 mg Oral TID Weber, Kyra A, NP   300 mg at 03/10/23 1808   magnesium hydroxide (MILK OF MAGNESIA) suspension  30 mL  30 mL Oral Daily PRN Weber, Kyra A, NP       nicotine (NICODERM CQ - dosed in mg/24 hours) patch 14 mg  14 mg Transdermal Daily Massengill, Nathan, MD   14 mg at 03/10/23 1125   QUEtiapine (SEROQUEL XR) 24 hr tablet 150 mg  150 mg Oral QHS Weber, Kyra A, NP       rosuvastatin (CRESTOR) tablet 10 mg  10 mg Oral Daily Weber, Kyra A, NP   10 mg at 03/10/23 1001   traZODone (DESYREL) tablet 100 mg  100 mg Oral QHS Weber, Kyra A, NP       PTA Medications: Medications Prior to Admission  Medication Sig Dispense Refill Last Dose/Taking   bictegravir-emtricitabine-tenofovir AF (BIKTARVY) 50-200-25 MG TABS tablet Take 1 tablet by mouth daily. 30 tablet 5    gabapentin (NEURONTIN) 300 MG capsule Take 1 capsule (300 mg total) by mouth 3 (three) times daily. 90 capsule 3    naproxen sodium (ALEVE) 220 MG tablet Take 220-440 mg by mouth 2 (two) times daily as needed (Pain).      QUEtiapine Fumarate (SEROQUEL XR) 150 MG 24 hr tablet Take 150 mg by mouth at bedtime.      rivaroxaban (XARELTO) 10 MG TABS tablet Take 1 tablet (10 mg total) by mouth daily. (Patient not taking: Reported on 03/09/2023) 30 tablet 1    rosuvastatin (CRESTOR) 10 MG tablet Take 1 tablet (10 mg total) by  mouth daily. 30 tablet 11    traZODone (DESYREL) 100 MG tablet Take 1 tablet (100 mg total) by mouth at bedtime as needed for sleep. 30 tablet 0     Musculoskeletal: Strength & Muscle Tone:  Unable to assess; patient lying in bed Gait & Station:  Unable to assess; patient lying in bed Patient leans: N/A            Psychiatric Specialty Exam:  Presentation  General Appearance:  Appropriate for Environment; Casual  Eye Contact: Absent  Speech: Slow; Garbled  Speech Volume: Decreased  Handedness: -- (not assessed)   Mood and Affect  Mood: Depressed  Affect: Other (comment) (Unable to assess, as patient is in and out of sleep)   Thought Process  Thought Processes: Other (comment) (Unable to assess)  Duration of Psychotic Symptoms: Patient reports for "a long time." Past Diagnosis of Schizophrenia or Psychoactive disorder: No  Descriptions of Associations:-- (Unable to assess)  Orientation:Other (comment) (Unable to assess)  Thought Content:-- (Unable to assess)  Hallucinations:Hallucinations: Auditory; Command Description of Auditory Hallucinations: Chronic  Ideas of Reference:None  Suicidal Thoughts:Suicidal Thoughts: No  Homicidal Thoughts:Homicidal Thoughts: No   Sensorium  Memory: Other (comment) (Unable to assess)  Judgment: Impaired  Insight: -- (Unable to assess)   Executive Functions  Concentration: Other (comment)  Attention Span: Other (comment)  Recall: Other (comment)  Fund of Knowledge: Other (comment)  Language: Other (comment)   Psychomotor Activity  Psychomotor Activity:Psychomotor Activity: Decreased   Assets  Assets: Communication Skills; Desire for Improvement; Resilience   Sleep  Sleep:Sleep: -- (Unable to assess)    Physical Exam: Patient lying in bed and would not wake to engage in assessment, so unable to assess at this time. Physical Exam ROS Blood pressure 107/65, pulse 82,  temperature 97.8 F (36.6 C), temperature source Oral, resp. rate 18, height 6\' 2"  (1.88 m), weight 104.3 kg, SpO2 97%. Body mass index is 29.52 kg/m.  Treatment Plan Summary: Patient largely unwilling to engage in assessment today.  We  will defer to ED notes for information about patient's reason for admission.  We will continue home medications and reassess tomorrow.  Patient will benefit from continued admission for crisis stabilization, medication optimization, and safety planning. Daily contact with patient to assess and evaluate symptoms and progress in treatment and Plan    #History of bipolar disorder #History of PTSD -Continue home Seroquel 150 mg nightly for mood stabilization and anxiety - Continue home trazodone 100 mg nightly for insomnia - Continue home gabapentin 300 mg 3 times daily for anxiety  #Polysubstance use disorder - Continue gabapentin 300 mg 3 times daily for alcohol cravings and anxiety  #HIV -Continue home Biktarvy 50 - 200 - 25 mg  #Dyslipidemia - Continue home Crestor 10 mg daily  Continue BH Agitation Protocol --Haldol 5 mg, oral, 3 times daily as needed, mild agitation --Benadryl 50 mg, oral, 3 times daily as needed, mild agitation                                     OR  --Haldol injection 5 mg, IM, 3 times daily as needed, moderate agitation --Benadryl injection 50 mg, IM, 3 times daily as needed, moderate agitation --Ativan injection 2 mg, IM, 3 times daily as needed, moderate agitation                                     OR --Haldol injection 10 mg, IM, 3 times daily as needed, severe agitation --Benadryl injection 50 mg, IM, 3 times daily as needed, severe agitation --Ativan injection 2 mg, IM, 3 times daily as needed, severe agitation   Observation Level/Precautions:  15 minute checks  Laboratory:  CBC Chemistry Profile  Psychotherapy: Group therapy on the milieu  Medications: Home medications  Consultations: NA  Discharge Concerns: Safety  planning, medication optimization  Estimated LOS: 5 to 7 days  Other:     Physician Treatment Plan for Primary Diagnosis: Suicidal ideations Long Term Goal(s): Improvement in symptoms so as ready for discharge  Short Term Goals: Ability to identify changes in lifestyle to reduce recurrence of condition will improve, Ability to verbalize feelings will improve, Ability to disclose and discuss suicidal ideas, Ability to demonstrate self-control will improve, Ability to identify and develop effective coping behaviors will improve, Compliance with prescribed medications will improve, and Ability to identify triggers associated with substance abuse/mental health issues will improve  Physician Treatment Plan for Secondary Diagnosis: Principal Problem:   Suicidal ideations  Long Term Goal(s): Improvement in symptoms so as ready for discharge  Short Term Goals: Ability to identify changes in lifestyle to reduce recurrence of condition will improve, Ability to verbalize feelings will improve, Ability to disclose and discuss suicidal ideas, Ability to demonstrate self-control will improve, Compliance with prescribed medications will improve, and Ability to identify triggers associated with substance abuse/mental health issues will improve  I certify that inpatient services furnished can reasonably be expected to improve the patient's condition.    Lamar Sprinkles, MD 3/1/20258:16 PM

## 2023-03-10 NOTE — Progress Notes (Signed)
   03/10/23 1200  Psych Admission Type (Psych Patients Only)  Admission Status Involuntary  Psychosocial Assessment  Patient Complaints None  Eye Contact Brief  Facial Expression Anxious  Affect Appropriate to circumstance  Speech Logical/coherent  Interaction Assertive  Motor Activity Slow  Appearance/Hygiene Unremarkable  Behavior Characteristics Cooperative  Mood Depressed;Anxious  Thought Process  Coherency WDL  Content WDL  Delusions WDL  Perception Hallucinations  Hallucination Auditory  Judgment WDL  Confusion None  Danger to Self  Current suicidal ideation? Passive  Self-Injurious Behavior No self-injurious ideation or behavior indicators observed or expressed   Agreement Not to Harm Self Yes  Description of Agreement agreed to contact staff before acting on harmful thoughts  Danger to Others  Danger to Others Reported or observed  Danger to Others Abnormal  Harmful Behavior to others No threats or harm toward other people  Destructive Behavior No threats or harm toward property

## 2023-03-10 NOTE — Progress Notes (Signed)
 Patient brought in from [facility] via Investment banker, operational (IVC). He  presented with SA by walking into traffic and AH ( command type) . He is admitted to [facility]- 407-1. He is A/O x 4, ambulatory. Patient denies SI/HI/AVH. Per report, patient was noted responding in the ED. Patient with increased anxiety and agitation on admission. Non invasive skin search and contraband search performed with assigned [facility] Staff. Patient noted with a healing scar to the left knee, S/P Total Knee Replacement. > 2 years ago. In addition a small abrasion noted to the chest. No odor or drainage noted. Patient assisted to the Unit, and given a breakfast tray in which he consumed. No adverse reactions noted or reported. Per report, UDS unable to be obtained in ED. Report given to oncoming shift in reference to UDS and completion of Admission Process.

## 2023-03-10 NOTE — [facility] Group Notes (Signed)
 Psychoeducational Group Note  Date:  03/10/2023 Time:  2000  Group Topic/Focus:  Wrap up group  Participation Level: Did Not Attend  Participation Quality:  Not Applicable  Affect:  Not Applicable  Cognitive:  Not Applicable  Insight:  Not Applicable  Engagement in Group: Not Applicable  Additional Comments:  Did not attend.   Marcille Buffy 03/10/2023, 9:53 PM

## 2023-03-10 NOTE — Group Note (Signed)
 Date:  03/10/2023 Time:  4:27 PM  Group Topic/Focus:  Goals Group:   The focus of this group is to help patients establish daily goals to achieve during treatment and discuss how the patient can incorporate goal setting into their daily lives to aide in recovery. Orientation:   The focus of this group is to educate the patient on the purpose and policies of crisis stabilization and provide a format to answer questions about their admission.  The group details unit policies and expectations of patients while admitted.    Participation Level:  Did Not Attend  Participation Quality:   n/a  Affect:   n/a  Cognitive:   n/a  Insight: None  Engagement in Group:   n/a  Modes of Intervention:   n/a  Additional Comments:   Did not attend.  Edmund Hilda Presten Joost 03/10/2023, 4:27 PM

## 2023-03-10 NOTE — Progress Notes (Addendum)
 PSA 1st Attempt:    CSW attempted to complete PSA, Consents and ROI 3.1.25 @1450 .  CSW explained her role, pt declines at this time to speak with a social worker due to wanting to sleep and not be inturrupted. CSW made a note on [facility] handoff for weekday CSW to followup with pt.   Steffanie Dunn LCSWA

## 2023-03-11 MED ORDER — IBUPROFEN 600 MG PO TABS
600.0000 mg | ORAL_TABLET | Freq: Once | ORAL | Status: AC
  Administered 2023-03-11: 600 mg via ORAL
  Filled 2023-03-11 (×2): qty 1

## 2023-03-11 MED ORDER — QUETIAPINE FUMARATE 25 MG PO TABS
12.5000 mg | ORAL_TABLET | Freq: Two times a day (BID) | ORAL | Status: DC | PRN
  Filled 2023-03-11: qty 1

## 2023-03-11 NOTE — Plan of Care (Signed)
   Problem: Education: Goal: Emotional status will improve Outcome: Progressing Goal: Mental status will improve Outcome: Progressing

## 2023-03-11 NOTE — Progress Notes (Signed)
   03/11/23 1100  Psych Admission Type (Psych Patients Only)  Admission Status Involuntary  Psychosocial Assessment  Patient Complaints None  Eye Contact Brief  Facial Expression Anxious  Affect Appropriate to circumstance  Speech Logical/coherent  Interaction Assertive  Motor Activity Slow  Appearance/Hygiene Unremarkable  Behavior Characteristics Cooperative  Mood Depressed;Anxious  Thought Process  Coherency WDL  Content WDL  Delusions None reported or observed  Perception WDL  Hallucination None reported or observed  Judgment WDL  Confusion None  Danger to Self  Current suicidal ideation? Denies  Self-Injurious Behavior No self-injurious ideation or behavior indicators observed or expressed   Agreement Not to Harm Self Yes  Description of Agreement agreed to contact staff before acting on harmful thoughts  Danger to Others  Danger to Others None reported or observed  Danger to Others Abnormal  Harmful Behavior to others No threats or harm toward other people  Destructive Behavior No threats or harm toward property

## 2023-03-11 NOTE — Group Note (Unsigned)
 Date:  03/11/2023 Time:  1:51 PM  Group Topic/Focus:  Recovery Goals:   The focus of this group is to identify appropriate goals for recovery and establish a plan to achieve them.     Participation Level:  {BHH PARTICIPATION ZOXWR:60454}  Participation Quality:  {BHH PARTICIPATION QUALITY:22265}  Affect:  {BHH AFFECT:22266}  Cognitive:  {BHH COGNITIVE:22267}  Insight: {BHH Insight2:20797}  Engagement in Group:  {BHH ENGAGEMENT IN UJWJX:91478}  Modes of Intervention:  {BHH MODES OF INTERVENTION:22269}  Additional Comments:  ***  Estill Dooms 03/11/2023, 1:51 PM

## 2023-03-11 NOTE — Group Note (Signed)
 Date:  03/11/2023 Time:  2:30 PM  Group Topic/Focus:  Dimensions of Wellness:   The focus of this group is to introduce the topic of wellness and discuss the role each dimension of wellness plays in total health.    Participation Level:  Did Not Attend   Randon Goldsmith Eathon Valade 03/11/2023, 2:30 PM

## 2023-03-11 NOTE — Group Note (Signed)
 Date:  03/11/2023 Time:  1:00 PM  Group Topic/Focus:  Emotional Education:   The focus of this group is to discuss what feelings/emotions are, and how they are experienced.    Participation Level:  Did Not Attend  Participation Quality:  Did Not Attend  Affect:  Did Not Attend  Cognitive:  Did Not Attend  Insight: None  Engagement in Group:  Did Not Attend  Modes of Intervention:  Did Not Attend  Additional Comments:  Did Not Attend  Dylan [name] 03/11/2023, 1:00 PM
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
## 2023-03-11 NOTE — BHH Group Notes (Signed)
 BHH Group Notes:  (Nursing/MHT/Case Management/Adjunct)  Date:  03/11/2023  Time:  9:33 PM  Type of Therapy:   Wrap-up group  Participation Level:  Active  Participation Quality:  Appropriate  Affect:  Appropriate  Cognitive:  Appropriate  Insight:  Appropriate  Engagement in Group:  Engaged  Modes of Intervention:  Education  Summary of Progress/Problems: Pt had no goal. Rated day 8/10.  Noah Delaine 03/11/2023, 9:33 PM
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
## 2023-03-12 ENCOUNTER — Encounter (HOSPITAL_COMMUNITY): Payer: Self-pay

## 2023-03-12 DIAGNOSIS — F333 Major depressive disorder, recurrent, severe with psychotic symptoms: Principal | ICD-10-CM | POA: Diagnosis present

## 2023-03-12 MED ORDER — WHITE PETROLATUM EX OINT
TOPICAL_OINTMENT | CUTANEOUS | Status: AC
  Filled 2023-03-12: qty 5

## 2023-03-12 MED ORDER — TRAZODONE HCL 50 MG PO TABS
50.0000 mg | ORAL_TABLET | Freq: Every evening | ORAL | Status: DC | PRN
Start: 2023-03-12 — End: 2023-03-14
  Administered 2023-03-12 – 2023-03-13 (×2): 50 mg via ORAL
  Filled 2023-03-12: qty 1

## 2023-03-12 MED ORDER — MIRTAZAPINE 7.5 MG PO TABS
7.5000 mg | ORAL_TABLET | Freq: Every day | ORAL | Status: DC
  Administered 2023-03-12: 7.5 mg via ORAL
  Filled 2023-03-12 (×3): qty 1

## 2023-03-12 MED ORDER — ONDANSETRON 4 MG PO TBDP
8.0000 mg | ORAL_TABLET | Freq: Three times a day (TID) | ORAL | Status: DC | PRN
  Administered 2023-03-12 – 2023-03-13 (×2): 8 mg via ORAL
  Filled 2023-03-12 (×3): qty 2

## 2023-03-12 NOTE — Group Note (Signed)
 Date:  03/12/2023 Time:  9:38 PM  Group Topic/Focus:  Alcoholics Anonymous (AA) Meeting    Participation Level:  Did Not Attend  Participation Quality:   n/a  Affect:   n/a  Cognitive:   n/a  Insight: None  Engagement in Group:   n/a  Modes of Intervention:   n/a  Additional Comments:  Patient did not attend  Kennieth Francois 03/12/2023, 9:38 PM

## 2023-03-12 NOTE — Progress Notes (Signed)
 Writer offered again to help Roshard to clean up bathroom. Roald refused and said I was lying. No one ever said he has to clean up behind himself. This is explain during admission as well as thru out the patient's stay. RN sitting at the nurse's station and heard [place] talking.

## 2023-03-12 NOTE — Group Note (Signed)
 Date:  03/12/2023 Time:  6:23 PM  Group Topic/Focus:  Dimensions of Wellness:   The focus of this group is to introduce the topic of wellness and discuss the role each dimension of wellness plays in total health. Overcoming Stress:   The focus of this group is to define stress and help patients assess their triggers.    Participation Level:  Did Not Attend  Participation Quality:   n/a  Affect:   n/a  Cognitive:   n/a  Insight: None  Engagement in Group:   n/a  Modes of Intervention:  n/a  Additional Comments:   Did not attend.  Edmund Hilda Nils Thor 03/12/2023, 6:23 PM

## 2023-03-12 NOTE — Plan of Care (Signed)

## 2023-03-12 NOTE — [facility] Suicide Risk Assessment (Signed)
 [facility] INPATIENT:  Family/Significant Other Suicide Prevention Education  Suicide Prevention Education:  Patient Refusal for Family/Significant Other Suicide Prevention Education: The patient Dylan [name] has refused to provide written consent for family/significant other to be provided Family/Significant Other Suicide Prevention Education during admission and/or prior to discharge.  Physician notified.  Kathi Der 03/12/2023, 4:17 PM

## 2023-03-12 NOTE — Progress Notes (Signed)
   03/12/23 0000  Complaints & Interventions  Complains of Heartburn  Interventions Medication (see MAR)  Patients response to intervention Unchanged   Pt reported heartburn and indigestion. He was given PRN Maalox and he verbalized no relief.

## 2023-03-12 NOTE — Progress Notes (Signed)
 Writer offered to [employer] as he was vomiting and Quintin indicated more than one time he is sick. I tried to assist more than once. I offered towels and wash cloths to help clean up the bathroom Feliz Beam refused. RN notify

## 2023-03-12 NOTE — Group Note (Signed)
 Recreation Therapy Group Note   Group Topic:Communication  Group Date: 03/12/2023 Start Time: 0934 End Time: 1002 Facilitators: Sahej Hauswirth-McCall, LRT,CTRS Location: [address] Dayroom   Group Topic: Communication, Problem Solving   Goal Area(s) Addresses:  Patient will effectively listen to complete activity.  Patient will identify communication skills used to make activity successful.  Patient will identify how skills used during activity can be used to reach post d/c goals.    Intervention: Building surveyor Activity - Geometric pattern cards, pencils, blank paper    Activity: Geometric Drawings.  Three volunteers from the peer group will be shown an abstract picture with a particular arrangement of geometrical shapes.  Each round, one 'speaker' will describe the pattern, as accurately as possible without revealing the image to the group.  The remaining group members will listen and draw the picture to reflect how it is described to them. Patients with the role of 'listener' cannot ask clarifying questions but, may request that the speaker repeat a direction. Once the drawings are complete, the presenter will show the rest of the group the picture and compare how close each person came to drawing the picture. LRT will facilitate a post-activity discussion regarding effective communication and the importance of planning, listening, and asking for clarification in daily interactions with others.   Education: Environmental consultant, Active listening, Support systems, Discharge planning  Education Outcome: Acknowledges understanding/In group clarification offered/Needs additional education.    Affect/Mood: Appropriate   Participation Level: Engaged   Participation Quality: Independent   Behavior: Appropriate   Speech/Thought Process: Focused   Insight: Good   Judgement: Good   Modes of Intervention: Activity   Patient Response to Interventions:  Engaged   Education  Outcome:  In group clarification offered    Clinical Observations/Individualized Feedback: Pt was engaged in the activity. Pt was a presenter in the activity as well. Pt was able to follow along but also showed moments of confusion. Pt was able to get back on track with the activity. As a presenter, pt was very detailed and went at a pace were peers could keep up with him. During discussion, pt talked about how different regions have different ways of communicating with each other. Pt used the example of him being from [place]. Pt explained the way he talks in [place], he [place] not translate the same.    Plan: Continue to engage patient in RT group sessions 2-3x/week.   Jossilyn Benda-McCall, LRT,CTRS 03/12/2023 12:29 PM

## 2023-03-12 NOTE — Progress Notes (Signed)
 Pt reported having some pain in his right foot. When his foot was assessed, he had some slight swelling.Pt states he usually takes ibuprofen 800mg  for swelling to help with the inflammation. He was given tylenol earlier for pain and swelling but he stated it did not help much.   Provider paged and made aware. Ibuprofen 600 mg one time order was placed by provider and medication was given to patient.

## 2023-03-12 NOTE — Progress Notes (Signed)
   03/12/23 0400  Complaints & Interventions  Complains of Nausea /  Vomiting  Interventions Relaxation   Pt had two episodes of vomiting undigested food. Pt denies any other symptoms except that he feels like he has heartburn.   Provider paged and made aware. PRN medication ordered for nausea.

## 2023-03-12 NOTE — Progress Notes (Signed)
   03/12/23 2200  Psych Admission Type (Psych Patients Only)  Admission Status Involuntary  Psychosocial Assessment  Patient Complaints Depression  Eye Contact Fair  Facial Expression Anxious  Affect Depressed  Speech Logical/coherent  Interaction Assertive  Motor Activity Slow  Appearance/Hygiene Unremarkable  Behavior Characteristics Cooperative  Mood Depressed;Anxious  Thought Process  Coherency WDL  Content WDL  Delusions None reported or observed  Perception WDL  Hallucination None reported or observed  Confusion None  Danger to Self  Current suicidal ideation? Denies  Self-Injurious Behavior No self-injurious ideation or behavior indicators observed or expressed   Agreement Not to Harm Self Yes  Description of Agreement Verbal  Danger to Others  Danger to Others None reported or observed

## 2023-03-12 NOTE — Group Note (Signed)
 Date:  03/12/2023 Time:  4:24 PM  Group Topic/Focus:  Emotional Education:   The focus of this group is to discuss what feelings/emotions are, and how they are experienced. Managing Feelings:   The focus of this group is to identify what feelings patients have difficulty handling and develop a plan to handle them in a healthier way upon discharge.    Participation Level:  Did Not Attend  Participation Quality:   n/a  Affect:   n/a  Cognitive:   n/a  Insight: None  Engagement in Group:   n/a  Modes of Intervention:   n/a  Additional Comments:   Did not attend.   Edmund Hilda Juley Giovanetti 03/12/2023, 4:24 PM

## 2023-03-12 NOTE — BH IP Treatment Plan (Signed)
 Interdisciplinary Treatment and Diagnostic Plan Update  03/12/2023 Time of Session: 1127AM Dylan [name] MRN: [id number]  Principal Diagnosis: Suicidal ideations  Secondary Diagnoses: Principal Problem:   Suicidal ideations   Current Medications:  Current Facility-Administered Medications  Medication Dose Route Frequency Provider Last Rate Last Admin   acetaminophen (TYLENOL) tablet 650 mg  650 mg Oral Q6H PRN Phebe Colla A, NP   650 mg at 03/10/23 2158   alum & mag hydroxide-simeth (MAALOX/MYLANTA) 200-200-20 MG/5ML suspension 30 mL  30 mL Oral Q4H PRN Weber, Kyra A, NP   30 mL at 03/12/23 0010   bictegravir-emtricitabine-tenofovir AF (BIKTARVY) 50-200-25 MG per tablet 1 tablet  1 tablet Oral Daily Weber, Kyra A, NP   1 tablet at 03/12/23 0825   haloperidol (HALDOL) tablet 5 mg  5 mg Oral TID PRN Weber, Kyra A, NP       And   diphenhydrAMINE (BENADRYL) capsule 50 mg  50 mg Oral TID PRN Weber, Kyra A, NP       haloperidol lactate (HALDOL) injection 5 mg  5 mg Intramuscular TID PRN Weber, Kyra A, NP       And   diphenhydrAMINE (BENADRYL) injection 50 mg  50 mg Intramuscular TID PRN Weber, Bella Kennedy A, NP       And   LORazepam (ATIVAN) injection 2 mg  2 mg Intramuscular TID PRN Weber, Kyra A, NP       haloperidol lactate (HALDOL) injection 10 mg  10 mg Intramuscular TID PRN Weber, Kyra A, NP       And   diphenhydrAMINE (BENADRYL) injection 50 mg  50 mg Intramuscular TID PRN Weber, Bella Kennedy A, NP       And   LORazepam (ATIVAN) injection 2 mg  2 mg Intramuscular TID PRN Weber, Bella Kennedy A, NP       gabapentin (NEURONTIN) capsule 300 mg  300 mg Oral TID Weber, Kyra A, NP   300 mg at 03/12/23 0825   magnesium hydroxide (MILK OF MAGNESIA) suspension 30 mL  30 mL Oral Daily PRN Weber, Kyra A, NP       nicotine (NICODERM CQ - dosed in mg/24 hours) patch 14 mg  14 mg Transdermal Daily Massengill, Harrold Donath, MD   14 mg at 03/12/23 0826   ondansetron (ZOFRAN-ODT) disintegrating tablet 8 mg  8 mg Oral Q8H PRN  Bobbitt, Shalon E, NP   8 mg at 03/12/23 0825   QUEtiapine (SEROQUEL XR) 24 hr tablet 150 mg  150 mg Oral QHS Weber, Kyra A, NP   150 mg at 03/11/23 2053   QUEtiapine (SEROQUEL) tablet 12.5 mg  12.5 mg Oral BID PRN Lamar Sprinkles, MD       rosuvastatin (CRESTOR) tablet 10 mg  10 mg Oral Daily Weber, Kyra A, NP   10 mg at 03/12/23 0825   traZODone (DESYREL) tablet 100 mg  100 mg Oral QHS Weber, Kyra A, NP   100 mg at 03/11/23 2054   PTA Medications: Medications Prior to Admission  Medication Sig Dispense Refill Last Dose/Taking   bictegravir-emtricitabine-tenofovir AF (BIKTARVY) 50-200-25 MG TABS tablet Take 1 tablet by mouth daily. 30 tablet 5    gabapentin (NEURONTIN) 300 MG capsule Take 1 capsule (300 mg total) by mouth 3 (three) times daily. 90 capsule 3    naproxen sodium (ALEVE) 220 MG tablet Take 220-440 mg by mouth 2 (two) times daily as needed (Pain).      QUEtiapine Fumarate (SEROQUEL XR) 150 MG 24 hr tablet Take 150 mg  by mouth at bedtime.      rivaroxaban (XARELTO) 10 MG TABS tablet Take 1 tablet (10 mg total) by mouth daily. (Patient not taking: Reported on 03/09/2023) 30 tablet 1    rosuvastatin (CRESTOR) 10 MG tablet Take 1 tablet (10 mg total) by mouth daily. 30 tablet 11    traZODone (DESYREL) 100 MG tablet Take 1 tablet (100 mg total) by mouth at bedtime as needed for sleep. 30 tablet 0     Patient Stressors:    Patient Strengths:    Treatment Modalities: Medication Management, Group therapy, Case management,  1 to 1 session with clinician, Psychoeducation, Recreational therapy.   Physician Treatment Plan for Primary Diagnosis: Suicidal ideations Long Term Goal(s): Improvement in symptoms so as ready for discharge   Short Term Goals: Ability to identify changes in lifestyle to reduce recurrence of condition will improve Ability to verbalize feelings will improve Ability to disclose and discuss suicidal ideas Ability to demonstrate self-control will improve Compliance  with prescribed medications will improve Ability to identify triggers associated with substance abuse/mental health issues will improve Ability to identify and develop effective coping behaviors will improve  Medication Management: Evaluate patient's response, side effects, and tolerance of medication regimen.  Therapeutic Interventions: 1 to 1 sessions, Unit Group sessions and Medication administration.  Evaluation of Outcomes: Progressing  Physician Treatment Plan for Secondary Diagnosis: Principal Problem:   Suicidal ideations  Long Term Goal(s): Improvement in symptoms so as ready for discharge   Short Term Goals: Ability to identify changes in lifestyle to reduce recurrence of condition will improve Ability to verbalize feelings will improve Ability to disclose and discuss suicidal ideas Ability to demonstrate self-control will improve Compliance with prescribed medications will improve Ability to identify triggers associated with substance abuse/mental health issues will improve Ability to identify and develop effective coping behaviors will improve     Medication Management: Evaluate patient's response, side effects, and tolerance of medication regimen.  Therapeutic Interventions: 1 to 1 sessions, Unit Group sessions and Medication administration.  Evaluation of Outcomes: Not Progressing   RN Treatment Plan for Primary Diagnosis: Suicidal ideations Long Term Goal(s): Knowledge of disease and therapeutic regimen to maintain health will improve  Short Term Goals: Ability to verbalize frustration and anger appropriately will improve, Ability to participate in decision making will improve, Ability to disclose and discuss suicidal ideas, Ability to identify and develop effective coping behaviors will improve, and Compliance with prescribed medications will improve  Medication Management: RN will administer medications as ordered by provider, will assess and evaluate patient's  response and provide education to patient for prescribed medication. RN will report any adverse and/or side effects to prescribing provider.  Therapeutic Interventions: 1 on 1 counseling sessions, Psychoeducation, Medication administration, Evaluate responses to treatment, Monitor vital signs and CBGs as ordered, Perform/monitor CIWA, COWS, AIMS and Fall Risk screenings as ordered, Perform wound care treatments as ordered.  Evaluation of Outcomes: Not Progressing   LCSW Treatment Plan for Primary Diagnosis: Suicidal ideations Long Term Goal(s): Safe transition to appropriate next level of care at discharge, Engage patient in therapeutic group addressing interpersonal concerns.  Short Term Goals: Engage patient in aftercare planning with referrals and resources, Increase emotional regulation, Facilitate acceptance of mental health diagnosis and concerns, Facilitate patient progression through stages of change regarding substance use diagnoses and concerns, and Identify triggers associated with mental health/substance abuse issues  Therapeutic Interventions: Assess for all discharge needs, 1 to 1 time with Social worker, Explore available resources and support  systems, Assess for adequacy in community support network, Educate family and significant other(s) on suicide prevention, Complete Psychosocial Assessment, Interpersonal group therapy.  Evaluation of Outcomes: Not Progressing   Progress in Treatment: Attending groups: No. Participating in groups: No. Taking medication as prescribed: Yes. Toleration medication: Yes. Family/Significant other contact made: No, will contact:  Pt declined consents Patient understands diagnosis: No. Discussing patient identified problems/goals with staff: No. Medical problems stabilized or resolved: Yes. Denies suicidal/homicidal ideation: Yes. Issues/concerns per patient self-inventory: No. Other: n/a  New problem(s) identified: No, Describe:   None  New Short Term/Long Term Goal(s):detox, medication management for mood stabilization; elimination of SI thoughts; development of comprehensive mental wellness/sobriety plan  Patient Goals:  PT REFUSED PARTICIPATION IN TREATMENT TEAM  Discharge Plan or Barriers: Patient recently admitted. CSW will continue to follow and assess for appropriate referrals and possible discharge planning.    Reason for Continuation of Hospitalization: Medication stabilization Withdrawal symptoms Other; describe discharge planning  Estimated Length of Stay: 3-5 DAYS   Last 3 [place] Suicide Severity Risk Score: Flowsheet Row [facility] (Current[facility] from [facility]09/20[facility] ED from 02/05/2023 in [facility] Emergency Department at [facility]  C-SSRS RISK CATEGORY High Risk High Risk No Risk       Last [facility] 2/9 Scores:    02/14/2023    9:38 AM 02/14/2023    9:32 AM 01/17/2023   10:10 AM  Depression screen PHQ 2/9  Decreased Interest 0 0 0  Down, Depressed, Hopeless 0 0 0  PHQ - 2 Score 0 0 0    Scribe for Treatment Team: Jacinta Shoe, LCSW 03/12/2023 11:40 AM

## 2023-03-12 NOTE — Progress Notes (Signed)
 D: Patient is alert, oriented, and cooperative. Denies SI, HI, AVH, and verbally contracts for safety. Patient reports nausea/vomiting and right foot swelling.    Patient provided with sneakers from his locker as he thinks they will help with right foot. This RN removed shoe strings and searched shoes before giving to patient.   A: Scheduled medications administered per MD order. PRN zofran administered and was effective. Support provided. Patient educated on safety on the unit and medications. Routine safety checks every 15 minutes. Patient stated understanding to tell nurse about any new physical symptoms. Patient understands to tell staff of any needs.     R: No adverse drug reactions noted. Patient remains safe at this time and will continue to monitor.    03/12/23 1000  Psych Admission Type (Psych Patients Only)  Admission Status Involuntary  Psychosocial Assessment  Patient Complaints Depression  Eye Contact Fair  Facial Expression Anxious  Affect Depressed  Speech Logical/coherent  Interaction Assertive  Motor Activity Slow  Appearance/Hygiene Unremarkable  Behavior Characteristics Cooperative  Mood Depressed;Anxious  Thought Process  Coherency WDL  Content WDL  Delusions None reported or observed  Perception WDL  Hallucination None reported or observed  Judgment WDL  Confusion None  Danger to Self  Current suicidal ideation? Denies  Self-Injurious Behavior No self-injurious ideation or behavior indicators observed or expressed   Agreement Not to Harm Self Yes  Description of Agreement verbal  Danger to Others  Danger to Others None reported or observed  Danger to Others Abnormal  Harmful Behavior to others No threats or harm toward other people  Destructive Behavior No threats or harm toward property

## 2023-03-12 NOTE — Progress Notes (Cosign Needed Addendum)
 Patient ID: Dylan [name], male   DOB: 1965-02-15, 58 y.o.   MRN: [id number]  [facility] MD Progress Note   03/11/2023 9:03 AM Dylan [name]  MRN:  [id number]  Principal Problem: MDD recurrent Severe with Psychosis  Diagnosis: Principal Problem:  MDD recurrent Severe with Psychosis    Total Time spent with patient: 45 minutes   Original note written by A. Pernell Dupre, Student NP.  HPI: Dylan [name] is a 58 year old male with a reported history of bipolar disorder, PTSD, MDD, GAD, and polysubstance use disorder as well as a medical history of HIV, HTN, and GERD who was admitted involuntarily to [facility] from [name], ED for suicidal attempt by walking into traffic at the instructing of command auditory hallucinations.  24 hr chart review: Sleep Hours last night: 6.5 hrs per nursing and pt also reports a good sleep quality last night. Nursing Concerns: N/V reported by nurse. Behavioral episodes in the past 24 hrs: none  Medication Compliance: Compliant  Vital Signs in the past 24 hrs: WNL PRN Medications in the past 24 hrs: Trazodone and Seroquel  Assessment: Pt lying in bed on approach. Pt reports vomiting 3 times last night, and reports that his stomach feels "sour." Pt reports no physical pain and currently denies SI/HI, AVH. Pt did endorsed unintelligible audio hallucinations last night. Pt also denies delusions and paranoia. Pt reports poor appetite and poor sleep due to vomiting several times last night. Pt reports BM about 3 days ago, and stated that today his depression is 9/10 and anxiety is 9/10, with 10 being the worst. Pt denies any side effects from medications and no side effects to medications were observed.  We will discontinue trazodone, and order Remeron, to help with GI upset as well as help patient with sleep and depressive symptoms.  Continuing other medications as listed below, and we will continue to monitor.Patient is continuing to require inpatient hospitalization as he is continuing  to present severely depressed. We will be revising discharge planning on a daily basis.  Past Medical History:      Past Medical History:  Diagnosis Date   Anxiety     Arthritis     Bipolar 1 disorder (HCC)     Colon polyps     Depression     GERD (gastroesophageal reflux disease)     Hepatitis B     Human immunodeficiency virus (HIV) (HCC)     Hyperlipidemia     Hypertension     Insomnia due to other mental disorder 02/03/2020   Intentional drug overdose (HCC) 11/26/2021   Neuromuscular disorder (HCC)      neuropathy   Neuropathy     Pre-diabetes     Rhabdomyolysis 02/19/2017   Unilateral primary osteoarthritis, left knee 12/14/2021             Past Surgical History:  Procedure Laterality Date   FOOT ARTHRODESIS Right 09/17/2019    Procedure: FUSION RIGHT LISFRANC JOINT;  Surgeon: Nadara Mustard, MD;  Location: [facility] OR;  Service: Orthopedics;  Laterality: Right;   FOOT ARTHRODESIS Right 09/2019   HEMORROIDECTOMY       IR FLUORO GUIDE CV LINE RIGHT   02/22/2017   IR US GUIDE VASC ACCESS RIGHT   02/22/2017   TOTAL KNEE ARTHROPLASTY Left 12/14/2021    Procedure: LEFT TOTAL KNEE ARTHROPLASTY;  Surgeon: Nadara Mustard, MD;  Location: [facility] OR;  Service: Orthopedics;  Laterality: Left;   TUMOR REMOVAL        From Chest  Family History:       Family History  Problem Relation Age of Onset   CAD Mother     Diabetes Father     Stroke Father     Colon cancer Maternal Aunt 60   Diabetes Maternal Aunt     Heart disease Maternal Uncle     Heart attack Maternal Uncle     Esophageal cancer Neg Hx     Rectal cancer Neg Hx     Stomach cancer Neg Hx          Family Psychiatric  History: See H&P Social History:  Social History        Substance and Sexual Activity  Alcohol Use Yes    Comment: last drink March 2024     Social History        Substance and Sexual Activity  Drug Use Not Currently   Types: Cocaine, Marijuana    Comment: Last used cocaine March 2024; last  used cannabis 2022    Social History         Socioeconomic History   Marital status: Married      Spouse name: Not on file   Number of children: 1   Years of education: Not on file   Highest education level: Not on file  Occupational History   Not on file  Tobacco Use   Smoking status: Former      Current packs/day: 0.50      Average packs/day: 0.5 packs/day for 25.0 years (12.5 ttl pk-yrs)      Types: E-cigarettes, Cigarettes   Smokeless tobacco: Never   Tobacco comments:      Vapes daily  Vaping Use   Vaping status: Every Day   Start date: 12/09/2020   Substances: Nicotine  Substance and Sexual Activity   Alcohol use: Yes      Comment: last drink March 2024   Drug use: Not Currently      Types: Cocaine, Marijuana      Comment: Last used cocaine March 2024; last used cannabis 2022   Sexual activity: Yes      Partners: Female  Other Topics Concern   Not on file  Social History Narrative    Lives home alone. 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    Social Drivers of Acupuncturist Strain: Not on file  Food Insecurity: No Food Insecurity (03/10/2023)    Hunger Vital Sign     Worried About Running Out of Food in the Last Year: Never true     Ran Out of Food in the Last Year: Never true  Transportation Needs: Unmet Transportation Needs (03/10/2023)    PRAPARE - Therapist, art (Medical): Yes     Lack of Transportation (Non-Medical): Yes  Physical Activity: Not on file  Stress: Not on file  Social Connections: Not on file    Additional Social History: None   Sleep: Poor   Appetite:  Poor   Current Medications:          Current Facility-Administered Medications  Medication Dose Route Frequency Provider Last Rate Last Admin   acetaminophen (TYLENOL) tablet 650 mg  650 mg Oral Q6H PRN Weber, Kyra A, NP  650 mg at 03/10/23 2158   alum & mag hydroxide-simeth (MAALOX/MYLANTA) 200-200-20 MG/5ML suspension 30 mL  30 mL Oral  Q4H PRN Weber, Kyra A, NP     bictegravir-emtricitabine-tenofovir AF (BIKTARVY) 50-200-25 MG per tablet 1  tablet  1 tablet Oral Daily Weber, Kyra A, NP  1 tablet at 03/11/23 1610   haloperidol (HALDOL) tablet 5 mg  5 mg Oral TID PRN Phebe Colla A, NP      And   diphenhydrAMINE (BENADRYL) capsule 50 mg  50 mg Oral TID PRN Weber, Bella Kennedy A, NP     haloperidol lactate (HALDOL) injection 5 mg  5 mg Intramuscular TID PRN Weber, Bella Kennedy A, NP      And   diphenhydrAMINE (BENADRYL) injection 50 mg  50 mg Intramuscular TID PRN Weber, Bella Kennedy A, NP      And   LORazepam (ATIVAN) injection 2 mg  2 mg Intramuscular TID PRN Weber, Bella Kennedy A, NP     haloperidol lactate (HALDOL) injection 10 mg  10 mg Intramuscular TID PRN Weber, Bella Kennedy A, NP      And   diphenhydrAMINE (BENADRYL) injection 50 mg  50 mg Intramuscular TID PRN Weber, Bella Kennedy A, NP      And   LORazepam (ATIVAN) injection 2 mg  2 mg Intramuscular TID PRN Weber, Bella Kennedy A, NP     gabapentin (NEURONTIN) capsule 300 mg  300 mg Oral TID Weber, Kyra A, NP  300 mg at 03/11/23 9604   magnesium hydroxide (MILK OF MAGNESIA) suspension 30 mL  30 mL Oral Daily PRN Weber, Kyra A, NP     nicotine (NICODERM CQ - dosed in mg/24 hours) patch 14 mg  14 mg Transdermal Daily Massengill, Harrold Donath, MD  14 mg at 03/11/23 5409   QUEtiapine (SEROQUEL XR) 24 hr tablet 150 mg  150 mg Oral QHS Weber, Kyra A, NP  150 mg at 03/10/23 2155   rosuvastatin (CRESTOR) tablet 10 mg  10 mg Oral Daily Weber, Kyra A, NP  10 mg at 03/11/23 8119   traZODone (DESYREL) tablet 100 mg  100 mg Oral QHS Weber, Kyra A, NP  100 mg at 03/10/23 2155          Lab Results:  Lab Results Last 48 Hours        Results for orders placed or performed during the hospital encounter of 03/09/23 (from the past 48 hours)  Comprehensive metabolic panel     Status: Abnormal    Collection Time: 03/09/23 10:46 AM  Result Value Ref Range    Sodium 136 135 - 145 mmol/L    Potassium 3.8 3.5 - 5.1 mmol/L    Chloride 101 98 - 111  mmol/L    CO2 25 22 - 32 mmol/L    Glucose, Bld 99 70 - 99 mg/dL      Comment: Glucose reference range applies only to samples taken after fasting for at least 8 hours.    BUN 25 (H) 6 - 20 mg/dL    Creatinine, Ser 1.47 0.61 - 1.24 mg/dL    Calcium 9.6 8.9 - 82.9 mg/dL    Total Protein 7.5 6.5 - 8.1 g/dL    Albumin 4.2 3.5 - 5.0 g/dL    AST 562 (H) 15 - 41 U/L    ALT 49 (H) 0 - 44 U/L    Alkaline Phosphatase 80 38 - 126 U/L    Total Bilirubin 0.8 0.0 - 1.2 mg/dL    GFR, Estimated >13 >08 mL/min      Comment: (NOTE) Calculated using the CKD-EPI Creatinine Equation (2021)      Anion gap 10 5 - 15      Comment: Performed at [facility], [address], [place],  [place] [address]  Ethanol     Status: None    Collection Time: 03/09/23 10:46 AM  Result Value Ref Range    Alcohol, Ethyl (B) <10 <10 mg/dL      Comment: (NOTE) Lowest detectable limit for serum alcohol is 10 mg/dL.   For medical purposes only. Performed at [facility], [address], [place], [place] [address]    Salicylate level     Status: Abnormal    Collection Time: 03/09/23 10:46 AM  Result Value Ref Range    Salicylate Lvl <7.0 (L) 7.0 - 30.0 mg/dL      Comment: Performed at [facility], [address] [place], [place] [address]  Acetaminophen level     Status: Abnormal    Collection Time: 03/09/23 10:46 AM  Result Value Ref Range    Acetaminophen (Tylenol), Serum <10 (L) 10 - 30 ug/mL      Comment: (NOTE) Therapeutic concentrations vary significantly. A range of 10-30 ug/mL  may be an effective concentration for many patients. However, some  are best treated at concentrations outside of this range. Acetaminophen concentrations >150 ug/mL at 4 hours after ingestion  and >50 ug/mL at 12 hours after ingestion are often associated with  toxic reactions.   Performed at [facility], [address] [place], [place] [address]    cbc     Status: None    Collection Time: 03/09/23  10:46 AM  Result Value Ref Range    WBC 8.1 4.0 - 10.5 K/uL    RBC 4.50 4.22 - 5.81 MIL/uL    Hemoglobin 14.1 13.0 - 17.0 g/dL    HCT 95.1 88.4 - 16.6 %    MCV 91.3 80.0 - 100.0 fL    MCH 31.3 26.0 - 34.0 pg    MCHC 34.3 30.0 - 36.0 g/dL    RDW 06.3 01.6 - 01.0 %    Platelets 221 150 - 400 K/uL    nRBC 0.0 0.0 - 0.2 %      Comment: Performed at [facility], [address], [place], [place] [address]        Blood Alcohol level:  Recent Labs       Lab Results  Component Value Date    ETH <10 03/09/2023    ETH <10 12/04/2022        Metabolic Disorder Labs: Recent Labs       Lab Results  Component Value Date    HGBA1C 5.9 (H) 12/08/2022    MPG 122.63 12/08/2022    MPG 131 04/03/2022      Recent Labs       Lab Results  Component Value Date    PROLACTIN 2.6 (L) 08/25/2022    PROLACTIN 4.8 03/30/2022      Recent Labs       Lab Results  Component Value Date    CHOL 261 (H) 02/14/2023    TRIG 208 (H) 02/14/2023    HDL 53 02/14/2023    CHOLHDL 4.9 02/14/2023    VLDL 50 (H) 12/08/2022    LDLCALC 171 (H) 02/14/2023    LDLCALC 151 (H) 12/08/2022        Physical Findings: AIMS: 0  CIWA:   0 COWS:   0   Musculoskeletal: Strength & Muscle Tone: within normal limits Gait & Station:  Somewhat slowed and mildly unsteady 2/2 pain Patient leans: Left and Front   Psychiatric Specialty Exam:   Presentation  General Appearance:  Appropriate for Environment; Scrubs   Eye Contact: Brief   Speech: Coherent   Speech Volume: Normal   Handedness: Right     Mood and Affect  Mood: Depressed Anxious   Affect: Flat     Thought Process  Thought Processes: Coherent Descriptions of Associations: None Orientation: Full  Thought Content: Logical   History of Schizophrenia/Schizoaffective disorder:No   Duration of Psychotic Symptoms:N/A   Hallucinations:Hallucinations: Auditory; Command Description of Auditory Hallucinations: Chronic   Ideas  of Reference:None   Suicidal Thoughts:Suicidal Thoughts: No   Homicidal Thoughts:Homicidal Thoughts: No     Sensorium  Memory: Immediate, fair   Judgment: Impaired   Insight: Fair     Chartered certified accountant: Fair   Attention Span: Fair   Recall: Fair   Fund of Knowledge: Fair   Language: Good     Psychomotor Activity  Psychomotor Activity: Psychomotor Activity: Decreased     Assets  Assets: Communication Skills; Desire for Improvement; Resilience     Sleep  Sleep: Good       Physical Exam: Physical Exam Vitals reviewed.  Constitutional:      General: He is not in acute distress. HENT:     Head: Normocephalic and atraumatic.  Pulmonary:     Effort: Pulmonary effort is normal.  Neurological:     General: No focal deficit present.     Mental Status: He is alert.     Gait: Gait abnormal.     Comments: 2/2 pain from bone spurs and previous surgery      Review of Systems  Constitutional:  Negative for chills and diaphoresis.  Respiratory: Negative.    Cardiovascular:  Negative for chest pain.  Gastrointestinal: Negative.   Musculoskeletal:  Positive for joint pain. Negative for falls.  Neurological:  Negative for dizziness and headaches.    Blood pressure 115/72, pulse 62, temperature 98 F (36.7 C), temperature source Oral, resp. rate 18, height 6\' 2"  (1.88 m), weight 104.3 kg, SpO2 100%. Body mass index is 29.52 kg/m.     Treatment Plan Summary: Although patient was admitted involuntarily, he consents for voluntary treatment and poses no danger toward himself or others at this time.  We will rescind involuntary commitment and allow voluntary treatment at this time.  Patient's primary concern today is his anxiety.  He is med seeking, informing indirectly that he is asked multiple providers in [place] about prescribing him Xanax that he received in [place].  However with explanation, he is agreeable to a trial of an  additional low-dose Seroquel.   Daily contact with patient to assess and evaluate symptoms and progress in treatment and Medication management   #History of bipolar disorder #History of PTSD -Continue home Seroquel 150 mg nightly for mood stabilization and anxiety -Continue Seroquel 12.5 mg twice daily as needed for anxiety -Continue home trazodone 100 mg nightly for insomnia - Continue home gabapentin 300 mg 3 times daily for anxiety -Start Zofran 8 mg q8h prn for N/V -Start Remeron 7.5 mg nightly for sleep -Change Trazodone from 100 mg to 50 mg PRN -Start Colace for mild constipation   #Polysubstance use disorder - Continue gabapentin 300 mg 3 times daily for alcohol cravings and anxiety   #HIV -Continue home Biktarvy 50 - 200 - 25 mg   #Dyslipidemia - Continue home Crestor 10 mg daily   Continue BH Agitation Protocol --Haldol 5 mg, oral, 3 times daily as needed, mild agitation --Benadryl 50 mg, oral, 3 times daily as needed, mild  agitation                                     OR  --Haldol injection 5 mg, IM, 3 times daily as needed, moderate agitation --Benadryl injection 50 mg, IM, 3 times daily as needed, moderate agitation --Ativan injection 2 mg, IM, 3 times daily as needed, moderate agitation                                     OR --Haldol injection 10 mg, IM, 3 times daily as needed, severe agitation --Benadryl injection 50 mg, IM, 3 times daily as needed, severe agitation --Ativan injection 2 mg, IM, 3 times daily as needed, severe agitation    Observation Level/Precautions:  15 minute checks  Laboratory:  CBC Chemistry Profile  Psychotherapy: Group therapy on the milieu  Medications: Home medications  Consultations: NA  Discharge Concerns: Safety planning, medication optimization  Estimated LOS: 5 to 7 days  Other:      Dossie Arbour, RN - Student NP 03/11/2023  1:54 PM

## 2023-03-12 NOTE — Group Note (Signed)
 Date:  03/12/2023 Time:  1:02 PM  Group Topic/Focus:  Goals Group:   The focus of this group is to help patients establish daily goals to achieve during treatment and discuss how the patient can incorporate goal setting into their daily lives to aide in recovery. Orientation:   The focus of this group is to educate the patient on the purpose and policies of crisis stabilization and provide a format to answer questions about their admission.  The group details unit policies and expectations of patients while admitted.    Participation Level:  Active  Participation Quality:  Appropriate  Affect:  Appropriate  Cognitive:  Appropriate  Insight: Appropriate  Engagement in Group:  Engaged  Modes of Intervention:  Activity and Orientation  Additional Comments:   Pt attentive and cooperative during orientation. Pt completed the goals activity.   Edmund Hilda Shell Blanchette 03/12/2023, 1:02 PM

## 2023-03-13 DIAGNOSIS — F333 Major depressive disorder, recurrent, severe with psychotic symptoms: Secondary | ICD-10-CM | POA: Diagnosis not present

## 2023-03-13 LAB — CBC WITH DIFFERENTIAL/PLATELET
Abs Immature Granulocytes: 0.01 10*3/uL (ref 0.00–0.07)
Basophils Absolute: 0 10*3/uL (ref 0.0–0.1)
Basophils Relative: 0 %
Eosinophils Absolute: 0.2 10*3/uL (ref 0.0–0.5)
Eosinophils Relative: 3 %
HCT: 40.8 % (ref 39.0–52.0)
Hemoglobin: 12.9 g/dL — ABNORMAL LOW (ref 13.0–17.0)
Immature Granulocytes: 0 %
Lymphocytes Relative: 39 %
Lymphs Abs: 2.5 10*3/uL (ref 0.7–4.0)
MCH: 30.6 pg (ref 26.0–34.0)
MCHC: 31.6 g/dL (ref 30.0–36.0)
MCV: 96.7 fL (ref 80.0–100.0)
Monocytes Absolute: 0.7 10*3/uL (ref 0.1–1.0)
Monocytes Relative: 11 %
Neutro Abs: 3.1 10*3/uL (ref 1.7–7.7)
Neutrophils Relative %: 47 %
Platelets: 225 10*3/uL (ref 150–400)
RBC: 4.22 MIL/uL (ref 4.22–5.81)
RDW: 13.4 % (ref 11.5–15.5)
WBC: 6.5 10*3/uL (ref 4.0–10.5)
nRBC: 0 % (ref 0.0–0.2)

## 2023-03-13 LAB — COMPREHENSIVE METABOLIC PANEL
ALT: 29 U/L (ref 0–44)
AST: 23 U/L (ref 15–41)
Albumin: 4 g/dL (ref 3.5–5.0)
Alkaline Phosphatase: 75 U/L (ref 38–126)
Anion gap: 9 (ref 5–15)
BUN: 15 mg/dL (ref 6–20)
CO2: 27 mmol/L (ref 22–32)
Calcium: 8.4 mg/dL — ABNORMAL LOW (ref 8.9–10.3)
Chloride: 103 mmol/L (ref 98–111)
Creatinine, Ser: 1.19 mg/dL (ref 0.61–1.24)
GFR, Estimated: >60 mL/min (ref >60)
Glucose, Bld: 101 mg/dL — ABNORMAL HIGH (ref 70–99)
Potassium: 3.9 mmol/L (ref 3.5–5.1)
Sodium: 139 mmol/L (ref 135–145)
Total Bilirubin: 0.8 mg/dL (ref 0.0–1.2)
Total Protein: 7.3 g/dL (ref 6.5–8.1)

## 2023-03-13 LAB — FOLATE: Folate: 8.6 ng/mL (ref >5.9)

## 2023-03-13 LAB — RESP PANEL BY RT-PCR (RSV, FLU A&B, COVID)  RVPGX2
Influenza A by PCR: NEGATIVE
Influenza B by PCR: NEGATIVE
Resp Syncytial Virus by PCR: NEGATIVE
SARS Coronavirus 2 by RT PCR: NEGATIVE

## 2023-03-13 LAB — VITAMIN D 25 HYDROXY (VIT D DEFICIENCY, FRACTURES): Vit D, 25-Hydroxy: 14.12 ng/mL — ABNORMAL LOW (ref 30–100)

## 2023-03-13 LAB — CK: Total CK: 605 U/L — ABNORMAL HIGH (ref 49–397)

## 2023-03-13 MED ORDER — LOPERAMIDE HCL 2 MG PO CAPS
2.0000 mg | ORAL_CAPSULE | Freq: Once | ORAL | Status: AC
Start: 2023-03-13 — End: 2023-03-13
  Administered 2023-03-13: 2 mg via ORAL
  Filled 2023-03-13 (×2): qty 1

## 2023-03-13 MED ORDER — OLANZAPINE 10 MG PO TBDP
10.0000 mg | ORAL_TABLET | Freq: Every day | ORAL | Status: DC
  Administered 2023-03-13: 10 mg via ORAL
  Filled 2023-03-13 (×2): qty 1

## 2023-03-13 MED ORDER — CHLORPROMAZINE HCL 10 MG PO TABS: 10.0000 mg | ORAL_TABLET | Freq: Four times a day (QID) | ORAL | Status: DC | PRN

## 2023-03-13 MED ORDER — CHLORPROMAZINE HCL 25 MG/ML IJ SOLN: 10.0000 mg | Freq: Four times a day (QID) | INTRAMUSCULAR | Status: DC | PRN

## 2023-03-13 MED ORDER — PANTOPRAZOLE SODIUM 40 MG PO TBEC
40.0000 mg | DELAYED_RELEASE_TABLET | Freq: Every day | ORAL | Status: DC
  Administered 2023-03-13 – 2023-03-14 (×2): 40 mg via ORAL
  Filled 2023-03-13 (×5): qty 1

## 2023-03-13 MED ORDER — MIRTAZAPINE 7.5 MG PO TABS
7.5000 mg | ORAL_TABLET | Freq: Every day | ORAL | Status: DC
  Administered 2023-03-13: 7.5 mg via ORAL
  Filled 2023-03-13 (×2): qty 1

## 2023-03-13 MED ORDER — OLANZAPINE 2.5 MG PO TABS: 2.5000 mg | ORAL_TABLET | Freq: Two times a day (BID) | ORAL | Status: DC | PRN

## 2023-03-13 MED ORDER — GABAPENTIN 100 MG PO CAPS
100.0000 mg | ORAL_CAPSULE | Freq: Three times a day (TID) | ORAL | Status: DC
  Administered 2023-03-13 – 2023-03-14 (×3): 100 mg via ORAL
  Filled 2023-03-13 (×6): qty 1

## 2023-03-13 NOTE — Progress Notes (Signed)
 The patient reported to this [name] that he just recently vomited and had diarrhea. The patient flushed everything down the toilet before this author checked on him. He states that he gets sick every time he comes to this hospital and that their is something wrong with the food being served in [employer]. In addition, he wants to be tested for the norovirus.  The nurse has been notified.

## 2023-03-13 NOTE — Progress Notes (Signed)
 Patient ID: Dylan [name], male   DOB: [birth year], 58 y.o.   MRN: [id number]  [facility] MD Progress Note   03/11/2023 9:03 AM Dylan [name]  MRN:  [id number]  Principal Problem: MDD recurrent Severe with Psychosis  Diagnosis: Principal Problem:  MDD recurrent Severe with Psychosis    Total Time spent with patient: 45 minutes   HPI: Dylan [name] is a 58 year old male with a reported history of bipolar disorder, PTSD, MDD, GAD, and polysubstance use disorder as well as a medical history of HIV, HTN, and GERD who was admitted involuntarily to [facility] from [name], ED for suicidal attempt by walking into traffic at the instructing of command auditory hallucinations.   Today's Patient assessment note: Chart reviewed, case discussed with patient's treatment team.  As per nursing documentation, patient slept for 6.75 hours last night. Nursing notified of the need for vital signs for today to be completed.  Patient has had some episodes of nausea and vomiting in the past 2 days, also complaining of diarrhea.  Respiratory panel series of tests completed, and negative.  Orders placed for norovirus test.  Today, patient is not receptive to directions from staff regarding being in his room while we await results for his respiratory panel to come back.  He refuses to wear a mask, when staff redirects him multiple times to do so, and is defiant towards another patient, tells other patients on the unit that he is being "harassed", by Clinical research associate when Clinical research associate is in the process of educating him on the importance of complying with unit rules, and also the importance of staying in his room until results from the respiratory panel come back. This is so that if he has any upper respiratory infection or the norovirus, that he is not spreading it to other patients and staff. Patient is also educated on the importance of hand washing, but also becomes defiant while being educated on this.  She is repeatedly asking to be discharged,  stating that there is something wrong with her food in this hospital.  So far, patient has denied SI/HI/AVH.  He denies delusional thinking.  There are no overt signs of psychosis.  Patient reports pain to his right lower extremity, noted to have a +2-3 edema to his right foot.  He shares that he recently had surgery to this extremity, which left his foot swollen.  He reports a history of GERD, we will order Protonix 40 mg daily.  Zyprexa increased by attending psychiatrist to 10 mg nightly for mood stabilization, and 2.5 mg as needed for anxiety.  We are continuing other medications as noted below.  With patient remaining noncompliant with unit rules and protocols, and not following directions regarding isolating until test for norovirus comes back, we will consider discharging him tomorrow 3/5, if he is continuing to not be receptive to unit rules and protocols.  Current symptoms are not presenting patient is a danger to self or anyone else at this time, and he can be safely managed in the outpatient as long as there is a plan for continuity of care in the community.  We will collaborate with CSW to see if we can discharge tomorrow.  Labs Reviewed: Labs were reviewed.  Ordered yesterday are still pending.  Past Medical History:      Past Medical History:  Diagnosis Date   Anxiety     Arthritis     Bipolar 1 disorder (HCC)     Colon polyps     Depression  GERD (gastroesophageal reflux disease)     Hepatitis B     Human immunodeficiency virus (HIV) (HCC)     Hyperlipidemia     Hypertension     Insomnia due to other mental disorder 02/03/2020   Intentional drug overdose (HCC) 11/26/2021   Neuromuscular disorder (HCC)      neuropathy   Neuropathy     Pre-diabetes     Rhabdomyolysis 02/19/2017   Unilateral primary osteoarthritis, left knee 12/14/2021             Past Surgical History:  Procedure Laterality Date   FOOT ARTHRODESIS Right 09/17/2019    Procedure: FUSION RIGHT LISFRANC  JOINT;  Surgeon: Nadara Mustard, MD;  Location: [facility] OR;  Service: Orthopedics;  Laterality: Right;   FOOT ARTHRODESIS Right 09/2019   HEMORROIDECTOMY       IR FLUORO GUIDE CV LINE RIGHT   02/22/2017   IR US GUIDE VASC ACCESS RIGHT   02/22/2017   TOTAL KNEE ARTHROPLASTY Left 12/14/2021    Procedure: LEFT TOTAL KNEE ARTHROPLASTY;  Surgeon: Nadara Mustard, MD;  Location: [facility] OR;  Service: Orthopedics;  Laterality: Left;   TUMOR REMOVAL        From Chest        Family History:       Family History  Problem Relation Age of Onset   CAD Mother     Diabetes Father     Stroke Father     Colon cancer Maternal Aunt 34   Diabetes Maternal Aunt     Heart disease Maternal Uncle     Heart attack Maternal Uncle     Esophageal cancer Neg Hx     Rectal cancer Neg Hx     Stomach cancer Neg Hx          Family Psychiatric  History: See H&P Social History:  Social History        Substance and Sexual Activity  Alcohol Use Yes    Comment: last drink March 2024     Social History        Substance and Sexual Activity  Drug Use Not Currently   Types: Cocaine, Marijuana    Comment: Last used cocaine March 2024; last used cannabis 2022    Social History         Socioeconomic History   Marital status: Married      Spouse name: Not on file   Number of children: 1   Years of education: Not on file   Highest education level: Not on file  Occupational History   Not on file  Tobacco Use   Smoking status: Former      Current packs/day: 0.50      Average packs/day: 0.5 packs/day for 25.0 years (12.5 ttl pk-yrs)      Types: E-cigarettes, Cigarettes   Smokeless tobacco: Never   Tobacco comments:      Vapes daily  Vaping Use   Vaping status: Every Day   Start date: 12/09/2020   Substances: Nicotine  Substance and Sexual Activity   Alcohol use: Yes      Comment: last drink March 2024   Drug use: Not Currently      Types: Cocaine, Marijuana      Comment: Last used cocaine March 2024; last  used cannabis 2022   Sexual activity: Yes      Partners: Female  Other Topics Concern   Not on file  Social History Narrative    Lives home alone. 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    Social Drivers of Acupuncturist Strain: Not on file  Food Insecurity: No Food Insecurity (03/10/2023)    Hunger Vital Sign     Worried About Running Out of Food in the Last Year: Never true     Ran Out of Food in the Last Year: Never true  Transportation Needs: Unmet Transportation Needs (03/10/2023)    PRAPARE - Therapist, art (Medical): Yes     Lack of Transportation (Non-Medical): Yes  Physical Activity: Not on file  Stress: Not on file  Social Connections: Not on file    Additional Social History: None   Sleep: Poor   Appetite:  Poor   Current Medications:          Current Facility-Administered Medications  Medication Dose Route Frequency Provider Last Rate Last Admin   acetaminophen (TYLENOL) tablet 650 mg  650 mg Oral Q6H PRN Weber, Kyra A, NP  650 mg at 03/10/23 2158   alum & mag hydroxide-simeth (MAALOX/MYLANTA) 200-200-20 MG/5ML suspension 30 mL  30 mL Oral Q4H PRN Weber, Kyra A, NP     bictegravir-emtricitabine-tenofovir AF (BIKTARVY) 50-200-25 MG per tablet 1 tablet  1 tablet Oral Daily Weber, Kyra A, NP  1 tablet at 03/11/23 6213   haloperidol (HALDOL) tablet 5 mg  5 mg Oral TID PRN Phebe Colla A, NP      And   diphenhydrAMINE (BENADRYL) capsule 50 mg  50 mg Oral TID PRN Weber, Kyra A, NP     haloperidol lactate (HALDOL) injection 5 mg  5 mg Intramuscular TID PRN Weber, Kyra A, NP      And   diphenhydrAMINE (BENADRYL) injection 50 mg  50 mg Intramuscular TID PRN Weber, Bella Kennedy A, NP      And   LORazepam (ATIVAN) injection 2 mg  2 mg Intramuscular TID PRN Weber, Bella Kennedy A, NP     haloperidol lactate (HALDOL) injection 10 mg  10 mg Intramuscular TID PRN Weber, Kyra A, NP      And   diphenhydrAMINE (BENADRYL) injection 50 mg  50 mg  Intramuscular TID PRN Weber, Bella Kennedy A, NP      And   LORazepam (ATIVAN) injection 2 mg  2 mg Intramuscular TID PRN Weber, Bella Kennedy A, NP     gabapentin (NEURONTIN) capsule 300 mg  300 mg Oral TID Weber, Kyra A, NP  300 mg at 03/11/23 0865   magnesium hydroxide (MILK OF MAGNESIA) suspension 30 mL  30 mL Oral Daily PRN Weber, Kyra A, NP     nicotine (NICODERM CQ - dosed in mg/24 hours) patch 14 mg  14 mg Transdermal Daily Massengill, Nathan, MD  14 mg at 03/11/23 7846   QUEtiapine (SEROQUEL XR) 24 hr tablet 150 mg  150 mg Oral QHS Weber, Kyra A, NP  150 mg at 03/10/23 2155   rosuvastatin (CRESTOR) tablet 10 mg  10 mg Oral Daily Weber, Kyra A, NP  10 mg at 03/11/23 9629   traZODone (DESYREL) tablet 100 mg  100 mg Oral QHS Weber, Kyra A, NP  100 mg at 03/10/23 2155          Lab Results:  Lab Results Last 48 Hours        Results for orders placed or performed during the hospital encounter of 03/09/23 (from the past 48 hours)  Comprehensive metabolic panel     Status: Abnormal  Collection Time: 03/09/23 10:46 AM  Result Value Ref Range    Sodium 136 135 - 145 mmol/L    Potassium 3.8 3.5 - 5.1 mmol/L    Chloride 101 98 - 111 mmol/L    CO2 25 22 - 32 mmol/L    Glucose, Bld 99 70 - 99 mg/dL      Comment: Glucose reference range applies only to samples taken after fasting for at least 8 hours.    BUN 25 (H) 6 - 20 mg/dL    Creatinine, Ser 1.61 0.61 - 1.24 mg/dL    Calcium 9.6 8.9 - 09.6 mg/dL    Total Protein 7.5 6.5 - 8.1 g/dL    Albumin 4.2 3.5 - 5.0 g/dL    AST 045 (H) 15 - 41 U/L    ALT 49 (H) 0 - 44 U/L    Alkaline Phosphatase 80 38 - 126 U/L    Total Bilirubin 0.8 0.0 - 1.2 mg/dL    GFR, Estimated >40 >98 mL/min      Comment: (NOTE) Calculated using the CKD-EPI Creatinine Equation (2021)      Anion gap 10 5 - 15      Comment: Performed at [facility], [address] [place], [place] [address]  Ethanol     Status: None    Collection Time: 03/09/23 10:46 AM  Result Value Ref  Range    Alcohol, Ethyl (B) <10 <10 mg/dL      Comment: (NOTE) Lowest detectable limit for serum alcohol is 10 mg/dL.   For medical purposes only. Performed at [facility], [address], [place], [place] [address]    Salicylate level     Status: Abnormal    Collection Time: 03/09/23 10:46 AM  Result Value Ref Range    Salicylate Lvl <7.0 (L) 7.0 - 30.0 mg/dL      Comment: Performed at [facility], [address] [place], [place] [address]  Acetaminophen level     Status: Abnormal    Collection Time: 03/09/23 10:46 AM  Result Value Ref Range    Acetaminophen (Tylenol), Serum <10 (L) 10 - 30 ug/mL      Comment: (NOTE) Therapeutic concentrations vary significantly. A range of 10-30 ug/mL  may be an effective concentration for many patients. However, some  are best treated at concentrations outside of this range. Acetaminophen concentrations >150 ug/mL at 4 hours after ingestion  and >50 ug/mL at 12 hours after ingestion are often associated with  toxic reactions.   Performed at [facility], [address] [place], [place] [address]    cbc     Status: None    Collection Time: 03/09/23 10:46 AM  Result Value Ref Range    WBC 8.1 4.0 - 10.5 K/uL    RBC 4.50 4.22 - 5.81 MIL/uL    Hemoglobin 14.1 13.0 - 17.0 g/dL    HCT 46.9 62.9 - 52.8 %    MCV 91.3 80.0 - 100.0 fL    MCH 31.3 26.0 - 34.0 pg    MCHC 34.3 30.0 - 36.0 g/dL    RDW 41.3 24.4 - 01.0 %    Platelets 221 150 - 400 K/uL    nRBC 0.0 0.0 - 0.2 %      Comment: Performed at [facility], [address], [place], [place] [address]        Blood Alcohol level:  Recent Labs       Lab  Results  Component Value Date    ETH <10 03/09/2023    ETH <10 12/04/2022        Metabolic Disorder Labs: Recent Labs       Lab Results  Component Value Date    HGBA1C 5.9 (H) 12/08/2022    MPG 122.63 12/08/2022    MPG 131 04/03/2022      Recent Labs       Lab Results  Component Value Date     PROLACTIN 2.6 (L) 08/25/2022    PROLACTIN 4.8 03/30/2022      Recent Labs       Lab Results  Component Value Date    CHOL 261 (H) 02/14/2023    TRIG 208 (H) 02/14/2023    HDL 53 02/14/2023    CHOLHDL 4.9 02/14/2023    VLDL 50 (H) 12/08/2022    LDLCALC 171 (H) 02/14/2023    LDLCALC 151 (H) 12/08/2022        Physical Findings: AIMS: 0  CIWA:   0 COWS:   0   Musculoskeletal: Strength & Muscle Tone: within normal limits Gait & Station:  Somewhat slowed and mildly unsteady 2/2 pain Patient leans: Left and Front   Psychiatric Specialty Exam:   Presentation  General Appearance:  Appropriate for Environment; Scrubs   Eye Contact: Brief   Speech: Coherent   Speech Volume: Normal   Handedness: Right     Mood and Affect  Mood: Depressed Anxious   Affect: Flat     Thought Process  Thought Processes: Coherent Descriptions of Associations: None Orientation: Full  Thought Content: Logical   History of Schizophrenia/Schizoaffective disorder:No   Duration of Psychotic Symptoms:N/A   Hallucinations:Hallucinations: Auditory; Command Description of Auditory Hallucinations: Chronic   Ideas of Reference:None   Suicidal Thoughts:Suicidal Thoughts: No   Homicidal Thoughts:Homicidal Thoughts: No     Sensorium  Memory: Immediate, fair   Judgment: Impaired   Insight: Fair     Chartered certified accountant: Fair   Attention Span: Fair   Recall: Fair   Fund of Knowledge: Fair   Language: Good     Psychomotor Activity  Psychomotor Activity: Psychomotor Activity: Decreased     Assets  Assets: Communication Skills; Desire for Improvement; Resilience     Sleep  Sleep: Good       Physical Exam: Physical Exam Vitals reviewed.  Constitutional:      General: He is not in acute distress. HENT:     Head: Normocephalic and atraumatic.  Pulmonary:     Effort: Pulmonary effort is normal.  Neurological:     General: No focal  deficit present.     Mental Status: He is alert.     Gait: Gait abnormal.     Comments: 2/2 pain from bone spurs and previous surgery      Review of Systems  Constitutional:  Negative for chills and diaphoresis.  Respiratory: Negative.    Cardiovascular:  Negative for chest pain.  Gastrointestinal: Negative.   Musculoskeletal:  Positive for joint pain. Negative for falls.  Neurological:  Negative for dizziness and headaches.    Blood pressure 115/72, pulse 62, temperature 98 F (36.7 C), temperature source Oral, resp. rate 18, height 6\' 2"  (1.88 m), weight 104.3 kg, SpO2 100%. Body mass index is 29.52 kg/m.     Treatment Plan Summary: As per prior provider: "Although patient was admitted involuntarily, he consents for voluntary treatment and poses no danger toward himself or others at this time.  We will rescind involuntary  commitment and allow voluntary treatment at this time.  Patient's primary concern today is his anxiety.  He is med seeking, informing indirectly that he is asked multiple providers in [place] about prescribing him Xanax that he received in [place].  However with explanation, he is agreeable to a trial of an additional low-dose Seroquel".Seroquel changed today to Zyprexa, as patient continues to state that it is not effective, is non compliant with unit rules & protocols. We will consider dc tomorrow.   Daily contact with patient to assess and evaluate symptoms and progress in treatment and Medication management   #History of bipolar disorder #History of PTSD -Start Zyprexa 10 mg nightly for mood stabilization -Start Zyprexa 2.5 mg BID PRN -Discontinue Seroquel due to lack of effectiveness -Continue home trazodone 100 mg nightly for insomnia - Continue home gabapentin 300 mg 3 times daily for anxiety -Continue Zofran 8 mg q8h prn for N/V -Continue Remeron 7.5 mg nightly for sleep -Start Protonix 40 mg daily for GERD -Continue Trazodone from 100 mg to 50 mg  PRN  Her mood#Polysubstance use disorder - Continue gabapentin 300 mg 3 times daily for alcohol cravings and anxiety   #HIV -Continue home Biktarvy 50 - 200 - 25 mg   #Dyslipidemia - Continue home Crestor 10 mg daily   Continue BH Agitation Protocol --Haldol 5 mg, oral, 3 times daily as needed, mild agitation --Benadryl 50 mg, oral, 3 times daily as needed, mild agitation                                     OR  --Haldol injection 5 mg, IM, 3 times daily as needed, moderate agitation --Benadryl injection 50 mg, IM, 3 times daily as needed, moderate agitation --Ativan injection 2 mg, IM, 3 times daily as needed, moderate agitation                                     OR --Haldol injection 10 mg, IM, 3 times daily as needed, severe agitation --Benadryl injection 50 mg, IM, 3 times daily as needed, severe agitation --Ativan injection 2 mg, IM, 3 times daily as needed, severe agitation    Starleen Blue, PMHNP 03/11/2023  1:54 PMPatient ID: Dylan [name], male   DOB: May 07, 1965, 58 y.o.   MRN: [id number]

## 2023-03-13 NOTE — [facility] Group Notes (Signed)
 [facility] Group Notes:  (Nursing/MHT/Case Management/Adjunct)  Date:  03/13/2023  Time:  2000  Type of Therapy:   wrap up group  Participation Level:  Did Not Attend  Participation Quality:  Did not attend  Affect:   Did not attend  Cognitive:   Did not attend  Insight:  None  Engagement in Group:   Did not attend  Modes of Intervention:   Did not attend  Summary of Progress/Problems:  Fay Records 03/13/2023, 10:16 PM

## 2023-03-13 NOTE — Plan of Care (Signed)
   Problem: Education: Goal: Emotional status will improve Outcome: Progressing

## 2023-03-13 NOTE — Group Note (Signed)
 Date:  03/13/2023 Time:  4:23 PM  Group Topic/Focus:  Mental [employer] Group/ Coping Skills    Participation Level:  Did Not Attend  Participation Quality:   n/a  Affect:   n/a  Cognitive:   n/a  Insight: None  Engagement in Group:   n/a  Modes of Intervention:   n/a  Additional Comments:   Did not attend.  Edmund Hilda Sabriyah Wilcher 03/13/2023, 4:23 PM

## 2023-03-13 NOTE — Group Note (Signed)
 Date:  03/13/2023 Time:  1:42 PM  Group Topic/Focus:  Goals Group:   The focus of this group is to help patients establish daily goals to achieve during treatment and discuss how the patient can incorporate goal setting into their daily lives to aide in recovery. Orientation:   The focus of this group is to educate the patient on the purpose and policies of crisis stabilization and provide a format to answer questions about their admission.  The group details unit policies and expectations of patients while admitted.    Participation Level:  Did Not Attend  Participation Quality:   n/a  Affect:   n/a  Cognitive:   n/a  Insight: None  Engagement in Group:   n/a  Modes of Intervention:   n/a  Additional Comments:   Did not attend.  Edmund Hilda Machael Raine 03/13/2023, 1:42 PM

## 2023-03-13 NOTE — Plan of Care (Signed)
  Problem: Education: Goal: Knowledge of [place] [facility] Education information/materials will improve Outcome: Progressing Goal: Emotional status will improve Outcome: Progressing Goal: Mental status will improve Outcome: Progressing Goal: Verbalization of understanding the information provided will improve Outcome: Progressing   Problem: Activity: Goal: Interest or engagement in activities will improve Outcome: Progressing   Problem: Coping: Goal: Ability to verbalize frustrations and anger appropriately will improve Outcome: Progressing Goal: Ability to demonstrate self-control will improve Outcome: Progressing

## 2023-03-13 NOTE — Progress Notes (Signed)
 DAR NOTE: Patient presents with irritable affect and mood.  Reports passive SI, auditory and visual hallucinations.  Stated people are out to get him.  Complain of diarrhea, nausea and vomiting this shift.  Patient encouraged to stay in his room for droplet precaution pending his respiratory panel result.  Patient refused and was observed pacing the hallway and going into the dayroom.  Patient had to be redirected and educated about maintaining his safety and peers safety.  Patient was not compliant with redirection.  Rates depression at 8, hopelessness at 10, and anxiety at 10.  Maintained on routine safety checks.  Medications given as prescribed.  Support and encouragement offered as needed.  States goal for today is to find a good funeral home.  Patient was yelling in his room this afternoon.  Patient stated, "they are trying to get me in my dream."  Haldol 5 mg and Benadryl 50 mg given with good effect.  Patient is safe on the unit.

## 2023-03-13 NOTE — Progress Notes (Signed)
 Patient had an episode of vomiting. Patient stated that he "had acid reflux and had eaten popped popcorn". Patient denied feeling sick. PRN Zofran was given for nausea. Will continue to monitor this patient.

## 2023-03-13 NOTE — Group Note (Signed)
 Recreation Therapy Group Note   Group Topic:Animal Assisted Therapy   Group Date: 03/13/2023 Start Time: 0945 End Time: 1030 Facilitators: Karrington Mccravy-McCall, LRT,CTRS Location: [address] Dayroom   Animal-Assisted Activity (AAA) Program Checklist/Progress Notes Patient Eligibility Criteria Checklist & Daily Group note for Rec Tx Intervention  AAA/T Program Assumption of Risk Form signed by Patient/ or Parent Legal Guardian Yes  Patient is free of allergies or severe asthma Yes  Patient reports no fear of animals Yes  Patient reports no history of cruelty to animals Yes  Patient understands his/her participation is voluntary Yes  Patient washes hands before animal contact Yes  Patient washes hands after animal contact Yes  Education: Hand Washing, Appropriate Animal Interaction   Education Outcome: Acknowledges education.    Affect/Mood: Appropriate   Participation Level: Minimal   Participation Quality: Independent   Behavior: Attentive    Speech/Thought Process: Focused   Insight: Fair   Judgement: Fair    Modes of Intervention: Teaching laboratory technician   Patient Response to Interventions:  Receptive   Education Outcome:  In group clarification offered    Clinical Observations/Individualized Feedback: Pt came into group for about a minute before leaving. Pt wasn't allowed to return to group due to being sick.      Plan: Continue to engage patient in RT group sessions 2-3x/week.   Floy Riegler-McCall, LRT,CTRS 03/13/2023 12:35 PM

## 2023-03-13 NOTE — Group Note (Signed)
 LCSW Group Therapy Note  Group Date: 03/13/2023 Start Time: 1300 End Time: 1400  Participation:  patient was present and actively participated in the discussion.  Type of Therapy:  Group Therapy   Title:  Stronger Together:  Building Healthy Relationships  Objective: To explore loneliness, boundaries, and safe ways to build relationships.  Goals: Recognize healthy vs. unhealthy relationships. Learn safe ways to connect with others. Strengthen communication and [employer].  Summary: Participants discussed loneliness, healthy connections, and setting boundaries. They explored safe ways to meet people and shared personal experiences. Key insights were reinforced through discussion and quotes.  Therapeutic Modalities Used: Cognitive Behavioral Therapy (CBT) - elements - identifying unhealthy relationship patterns, challenging negative thoughts about connection Dialectical Behavior Therapy (DBT) - elements - setting and maintaining boundaries Supportive Group Therapy - Peer discussion, shared experiences, and emotional validation.   Alfredia Ferguson Ravynn Hogate, LCSWA 03/13/2023  7:08 PM

## 2023-03-13 NOTE — Plan of Care (Signed)
  Problem: Coping: Goal: Ability to verbalize frustrations and anger appropriately will improve Outcome: Progressing Goal: Ability to demonstrate self-control will improve Outcome: Progressing   Problem: Health Behavior/Discharge Planning: Goal: Identification of resources available to assist in meeting health care needs will improve Outcome: Progressing  Patient is isolative to his room denies SI/HI/A/VH and verbally contracts for safety. Patient complaint with medications requesting to be put back on Seroquel. Patient encouraged to attend on unit programming. Support and encouragement provided.

## 2023-03-14 ENCOUNTER — Ambulatory Visit: Payer: MEDICAID | Admitting: Family Medicine

## 2023-03-14 DIAGNOSIS — F333 Major depressive disorder, recurrent, severe with psychotic symptoms: Secondary | ICD-10-CM | POA: Diagnosis not present

## 2023-03-14 MED ORDER — MIRTAZAPINE 7.5 MG PO TABS
7.5000 mg | ORAL_TABLET | Freq: Every day | ORAL | 0 refills | Status: DC
Start: 2023-03-14 — End: 2023-06-06

## 2023-03-14 MED ORDER — NICOTINE 14 MG/24HR TD PT24: 14.0000 mg | MEDICATED_PATCH | Freq: Every day | TRANSDERMAL | 0 refills | Status: DC

## 2023-03-14 MED ORDER — PANTOPRAZOLE SODIUM 40 MG PO TBEC: 40.0000 mg | DELAYED_RELEASE_TABLET | Freq: Every day | ORAL | 0 refills | Status: DC

## 2023-03-14 MED ORDER — LOPERAMIDE HCL 2 MG PO CAPS
2.0000 mg | ORAL_CAPSULE | Freq: Four times a day (QID) | ORAL | Status: DC | PRN
  Administered 2023-03-14: 2 mg via ORAL
  Filled 2023-03-14: qty 1

## 2023-03-14 MED ORDER — BIKTARVY 50-200-25 MG PO TABS: 1.0000 | ORAL_TABLET | Freq: Every day | ORAL | 0 refills | Status: DC

## 2023-03-14 MED ORDER — GABAPENTIN 100 MG PO CAPS: 100.0000 mg | ORAL_CAPSULE | Freq: Three times a day (TID) | ORAL | 0 refills | Status: DC

## 2023-03-14 MED ORDER — TRAZODONE HCL 50 MG PO TABS: 50.0000 mg | ORAL_TABLET | Freq: Every evening | ORAL | 0 refills | Status: DC | PRN

## 2023-03-14 NOTE — Progress Notes (Signed)
 Patient ID: Dylan [name], male   DOB: [birth year], 58 y.o.   MRN: [id number] Patient refused to complete safety plan for discharge, reviewed prescriptions, follow up appointments and discharge instruction reviewed with patient, pt verbalized understanding. Pt discharge home via Tax[employer].

## 2023-03-14 NOTE — Plan of Care (Signed)
  Problem: Education: Goal: Knowledge of [place] [facility] Education information/materials will improve 03/14/2023 1021 by Thersa Salt, RN Outcome: Completed/Met 03/14/2023 0923 by Thersa Salt, RN Outcome: Progressing Goal: Emotional status will improve 03/14/2023 1021 by Thersa Salt, RN Outcome: Completed/Met 03/14/2023 0923 by Thersa Salt, RN Outcome: Progressing Goal: Mental status will improve 03/14/2023 1021 by Thersa Salt, RN Outcome: Completed/Met 03/14/2023 0923 by Thersa Salt, RN Outcome: Progressing Goal: Verbalization of understanding the information provided will improve 03/14/2023 1021 by Thersa Salt, RN Outcome: Completed/Met 03/14/2023 0923 by Thersa Salt, RN Outcome: Progressing

## 2023-03-14 NOTE — Transportation (Signed)
 03/14/2023  [name] DOB: [birth year] MRN: [id number]   RIDER WAIVER AND RELEASE OF LIABILITY  For the purposes of helping with transportation needs, [facility] partners with outside transportation providers (taxi companies, [place], Catering manager.) to give [employer] patients or other approved people the choice of on-demand rides [employer]") to our buildings for non-emergency visits.  By using [employer], I, the person signing this document, on behalf of myself and/or any legal minors (in my care using the [employer]), agree:  Science writer given to me are supplied by independent, outside transportation providers who do not work for, or have any affiliation with, [employer]. [place] is not a transportation company. [place] has no control over the quality or safety of the rides I get using [employer]. [facility] has no control over whether any outside ride will happen on time or not. [facility] gives no guarantee on the reliability, quality, safety, or availability on any rides, or that no mistakes will happen. I know and accept that traveling by vehicle (car, truck, [employer], Zenaida Niece, bus, taxi, etc.) has risks of serious injuries such as disability, being paralyzed, and death. I know and agree the risk of using [employer] is mine alone, and not [employer]. Transport Services are provided "as is" and as are available. The transportation providers are in charge for all inspections and care of the vehicles used to provide these rides. I agree not to take legal action against [place], its agents, employees, officers, directors, representatives, insurers, attorneys, assigns, successors, subsidiaries, and affiliates at any time for any reasons related directly or indirectly to using [employer]. I also agree not to take legal action against [place] or its affiliates for any injury, death, or damage to property caused by or related to using  [employer]. I have read this Waiver and Release of Liability, and I understand the terms used in it and their legal meaning. This Waiver is freely and voluntarily given with the understanding that my right (or any legal minors) to legal action against [place] relating to [employer] is knowingly given up to use these services.   I attest that I read the Ride Waiver and Release of Liability to [name], gave Mr. Limbaugh the opportunity to ask questions and answered the questions asked (if any). I affirm that [name] then provided consent for assistance with transportation.

## 2023-03-14 NOTE — [facility] Suicide Risk Assessment (Signed)
 Suicide Risk Assessment  Discharge Assessment    [facility] Discharge Suicide Risk Assessment   Principal Problem: MDD (major depressive disorder), recurrent, severe, with psychosis (HCC) Discharge Diagnoses: Principal Problem:   MDD (major depressive disorder), recurrent, severe, with psychosis (HCC) Active Problems:   HIV disease (HCC)   Tobacco use disorder   Vitamin D deficiency  HPI: Dylan [name] is a 58 year old male with a reported history of bipolar disorder, PTSD, MDD, GAD, and polysubstance use disorder as well as a medical history of HIV, HTN, and GERD who was admitted involuntarily to [facility] from [name], ED for suicidal attempt by walking into traffic at the instructing of command auditory hallucinations.   [facility] Course: During the patient's hospitalization, patient had extensive initial psychiatric evaluation, and follow-up psychiatric evaluations every day. Psychiatric diagnoses provided upon initial assessment: MDD  Patient's psychiatric medications were adjusted on admission: As follows: -Continue home Seroquel 150 mg nightly for mood stabilization and anxiety -Continue home trazodone 100 mg nightly for insomnia -Continue home gabapentin 300 mg 3 times daily for anxiety  During the hospitalization, other adjustments were made to the patient's psychiatric medication regimen. Medications at discharge are as follows: -Continue Zyprexa 10 mg nightly for mood stabilization -Continue home gabapentin 100 mg 3 times daily for anxiety -Continue Remeron 7.5 mg nightly for sleep -Continue Protonix 40 mg daily for GERD -Continue Trazodone 50 mg nightly for sleep  Patient's care was discussed during the interdisciplinary team meeting every day during the hospitalization. The patient denies having side effects to prescribed psychiatric medication. The patient was evaluated each day by a clinical provider to ascertain response to treatment. Improvement was noted by the patient's report  of decreasing symptoms, improved sleep and appetite, affect, medication tolerance, behavior, and participation in unit programming.  Patient was asked each day to complete a self inventory noting mood, mental status, pain, new symptoms, anxiety and concerns.    Psychiatric Symptoms were reported as significantly decreased or resolved completely by discharge.  On day of discharge, the patient reports that their mood is stable. The patient denied having suicidal thoughts for more than 48 hours prior to discharge.  Patient denies having homicidal thoughts.  Patient denies having auditory hallucinations.  Patient denies any visual hallucinations or other symptoms of psychosis. There are no overt signs of psychosis. The patient was motivated to continue taking medication with a goal of continued improvement in mental health.  Patient has exhibited medication seeking behaviors over the course of this hospitalization, repeatedly asking for Xanax, & was educated that this medication will not be prescribed to him during this hospitalization.  2 days prior to this discharge, patient began having GI symptoms, reporting nausea & vomiting along with diarrhea. He has been non receptive to directions & education to stay in his room while he is being tested for the norovirus. Respiratory panel testing on 3/4 was negative. Patient belligerent with the directions from staff to stay in his room, and non receptive to isolation precautions for questionable norovirus infection. Patient smeared feces on the walls of the unit, has been non receptive to instructions to practice hand hygiene, non receptive to directions to wear a mask, threw the mask in the thrash when it was handed to him, and repeatedly defied all unit rules & protocols. It is in the best interest of other patients on our unit & staff so as to ensure their safety, for patient to be discharged today, and to seek medical care for his symptoms. Due to  his lack of  compliance with our directions, there is a potential for him spreading any infection that he may potentially have to other patients/staff. Patient has been educated on the need to seek medical care after discharging from this facility.   The patient has also been educated on the need to call [phone], 911, present to the [facility] or the nearest ER should his mental health symptoms return or worsen. Patient denies SI/HI, denies AVH, denies any intent or plan to harm himself or others prior to this discharge. He has been instructed to call the [facility] for an appointment for f/u with his HIV disease, and a 30 day supply of Biktarvy has been sent to his preferred pharmacy along with his other psychiatry medications. Patient refused to allow for his vital signs to be checked prior to discharge, refused to sign his discharge papers handed to him by RN. Patient has consistently exhibited antisocial personality traits through out the course of this hospitalization.  Labs were reviewed with the patient, and abnormal results were discussed with the patient.  The patient is able to verbalize their individual safety plan to this provider.  # It is recommended to the patient to continue psychiatric medications as prescribed, after discharge from the hospital.    # It is recommended to the patient to follow up with your outpatient psychiatric provider and PCP.  # It was discussed with the patient, the impact of alcohol, drugs, tobacco have been there overall psychiatric and medical wellbeing, and total abstinence from substance use was recommended the patient.ed.  # Prescriptions provided or sent directly to preferred pharmacy at discharge. Patient agreeable to plan. Given opportunity to ask questions. Appears to feel comfortable with discharge.    # In the event of worsening symptoms, the patient is instructed to call the crisis hotline, 911 and or go to the nearest ED for appropriate evaluation  and treatment of symptoms. To follow-up with primary care provider for other medical issues, concerns and or health care needs  # Patient was discharged with a plan to follow up as noted below.   Total Time spent with patient: 45 minutes  Musculoskeletal: Strength & Muscle Tone: within normal limits Gait & Station: normal Patient leans: N/A  Psychiatric Specialty Exam  Presentation  General Appearance:  Casual  Eye Contact: Fair  Speech: Clear and Coherent  Speech Volume: Decreased  Handedness: Right   Mood and Affect  Mood: Depressed  Duration of Depression Symptoms: Less than two weeks  Affect: Congruent   Thought Process  Thought Processes: Coherent  Descriptions of Associations:Intact  Orientation:Full (Time, Place and Person)  Thought Content:Logical  History of Schizophrenia/Schizoaffective disorder:No  Duration of Psychotic Symptoms:N/A  Hallucinations:Hallucinations: None  Ideas of Reference:None  Suicidal Thoughts:Suicidal Thoughts: No  Homicidal Thoughts:Homicidal Thoughts: No   Sensorium  Memory: Immediate Fair  Judgment: Poor  Insight: Poor   Executive Functions  Concentration: Poor  Attention Span: Fair  Recall: [employer] of Knowledge: Fair  Language: Fair   Psychomotor Activity  Psychomotor Activity: Psychomotor Activity: Normal   Assets  Assets: Resilience   Sleep  Sleep: Sleep: Fair   Physical Exam: Physical Exam Constitutional:      Appearance: Normal appearance.  Neurological:     Mental Status: He is alert.    Review of Systems  Psychiatric/Behavioral:  Positive for depression (Denies SI/HI, denies intent or plan to harm self) and substance abuse (Educated on cessation). Negative for hallucinations, memory loss and suicidal  ideas. The patient is nervous/anxious (stable) and has insomnia (Stable).   All other systems reviewed and are negative.  Blood pressure 107/62, pulse 65,  temperature 98.1 F (36.7 C), temperature source Oral, resp. rate 18, height 6\' 2"  (1.88 m), weight 104.3 kg, SpO2 100%. Body mass index is 29.52 kg/m.  Mental Status Per Nursing Assessment::   On Admission:  NA  Demographic Factors:  Male  Loss Factors: Financial problems/change in socioeconomic status  Historical Factors: Impulsivity  Risk Reduction Factors:   NA  Continued Clinical Symptoms:  More than one psychiatric diagnosis  Cognitive Features That Contribute To Risk:  None    Suicide Risk:  Minimal: No identifiable suicidal ideation.  Patients presenting with no risk factors but with morbid ruminations; may be classified as minimal risk based on the severity of the depressive symptoms    Follow-up Information     [facility] Go on 03/15/2023.   Why: You have a hospital follow up appointment for medication management and group therapy services on 03/15/23 at 10:00 am.  The appointment will be held in person. Contact information: [address] [place] [address] [phone]                Starleen Blue, NP 03/14/2023, 8:55 AM

## 2023-03-14 NOTE — Progress Notes (Signed)
 Patient c/o 2 small loose stool Patient encouraged to Provided stool has not provided hat is on his commode. Patient is has flat affect and irritable. Provider notified. Imodium 2 mg PO given at 0214. Patient provided with [facility] of water and encouraged to push fluids. Support ongoing.

## 2023-03-14 NOTE — Plan of Care (Signed)
   Problem: Education: Goal: Knowledge of [facility] Education information/materials will improve Outcome: Progressing Goal: Emotional status will improve Outcome: Progressing Goal: Mental status will improve Outcome: Progressing Goal: Verbalization of understanding the information provided will improve Outcome: Progressing

## 2023-03-14 NOTE — Progress Notes (Signed)
  [facility] Adult Case Management Discharge Plan :  Will you be returning to the same living situation after discharge:  Yes,  Pt is returning home at discharge At discharge, do you have transportation home?: Yes,  CSW arranged [employer] taxi for 1030AM Do you have the ability to pay for your medications: Yes,  pt has active tailored plan medicaid  Release of information consent forms completed and in the chart;  Patient's signature needed at discharge.  Patient to Follow up at:  Follow-up Information     [facility] Go on 03/15/2023.   Why: You have a hospital follow up appointment for medication management and group therapy services on 03/15/23 at 10:00 am.  The appointment will be held in person. Contact information: [address] [place] [address] [phone]         [facility]              Follow up.   Why: Please call this provider on 37/25 at 10:00AM to schedule an appointment for HIV follow up Contact information: [address] [place] [address]        [facility] Follow up.   Why: You may contact this facility regarding support for HIV diagnosis. Contact information: Address: [address] [place], [place] [address] Phone: [phone]   Fax: [phone]                Next level of care provider has access to [facility] Link:no  Safety Planning and Suicide Prevention discussed: No. Pt declined consents, information given to patient at discharge     Has patient been referred to the Quitline?: Patient does not use tobacco/nicotine products pt is a former smoker for 12.5 years, not currently smoking  Patient has been referred for addiction treatment: Patient refused referral for treatment.  Kathi Der, LCSWA 03/14/2023, 9:00 AM

## 2023-03-14 NOTE — Plan of Care (Signed)
  Problem: Education: Goal: Knowledge of [place] [facility] Education information/materials will improve 03/14/2023 1021 by Thersa Salt, RN Outcome: Completed/Met 03/14/2023 0923 by Thersa Salt, RN Outcome: Progressing Goal: Emotional status will improve 03/14/2023 1021 by Thersa Salt, RN Outcome: Completed/Met 03/14/2023 0923 by Thersa Salt, RN Outcome: Progressing Goal: Mental status will improve 03/14/2023 1021 by Thersa Salt, RN Outcome: Completed/Met 03/14/2023 0923 by Thersa Salt, RN Outcome: Progressing Goal: Verbalization of understanding the information provided will improve 03/14/2023 1021 by Thersa Salt, RN Outcome: Completed/Met 03/14/2023 0923 by Thersa Salt, RN Outcome: Progressing   Problem: Activity: Goal: Interest or engagement in activities will improve Outcome: Completed/Met Goal: Sleeping patterns will improve Outcome: Completed/Met

## 2023-03-15 NOTE — Discharge Summary (Signed)
 Physician Discharge Summary Note  Patient:  Dylan [name] is an 58 y.o., male MRN:  [id number] DOB:  Sep 10, 1965 Patient phone:  [phone] (home)  Patient address:   [address] [place] [address],  Total Time spent with patient: 45 minutes  Date of Admission:  03/10/2023 Date of Discharge: 03/14/2023  Reason for Admission: Dylan [name] is a 58 year old male with a reported history of bipolar disorder, PTSD, MDD, GAD, and polysubstance use disorder as well as a medical history of HIV, HTN, and GERD who was admitted involuntarily to [facility] from [name], ED for suicidal attempt by walking into traffic at the instructing of command auditory hallucinations.   HospitaL Course: During the patient's hospitalization, patient had extensive initial psychiatric evaluation, and follow-up psychiatric evaluations every day. Psychiatric diagnoses provided upon initial assessment: MDD   Patient's psychiatric medications were adjusted on admission: As follows: -Continue home Seroquel 150 mg nightly for mood stabilization and anxiety -Continue home trazodone 100 mg nightly for insomnia -Continue home gabapentin 300 mg 3 times daily for anxiety   During the hospitalization, other adjustments were made to the patient's psychiatric medication regimen. Medications at discharge are as follows: -Continue Zyprexa 10 mg nightly for mood stabilization -Continue home gabapentin 100 mg 3 times daily for anxiety -Continue Remeron 7.5 mg nightly for sleep -Continue Protonix 40 mg daily for GERD -Continue Trazodone 50 mg nightly for sleep   Patient's care was discussed during the interdisciplinary team meeting every day during the hospitalization. The patient denies having side effects to prescribed psychiatric medication. The patient was evaluated each day by a clinical provider to ascertain response to treatment. Improvement was noted by the patient's report of decreasing symptoms, improved sleep and  appetite, affect, medication tolerance, behavior, and participation in unit programming.  Patient was asked each day to complete a self inventory noting mood, mental status, pain, new symptoms, anxiety and concerns.     Psychiatric Symptoms were reported as significantly decreased or resolved completely by discharge.  On day of discharge, the patient reports that their mood is stable. The patient denied having suicidal thoughts for more than 48 hours prior to discharge.  Patient denies having homicidal thoughts.  Patient denies having auditory hallucinations.  Patient denies any visual hallucinations or other symptoms of psychosis. There are no overt signs of psychosis. The patient was motivated to continue taking medication with a goal of continued improvement in mental health.  Patient has exhibited medication seeking behaviors over the course of this hospitalization, repeatedly asking for Xanax, & was educated that this medication will not be prescribed to him during this hospitalization.   2 days prior to this discharge, patient began having GI symptoms, reporting nausea & vomiting along with diarrhea. He has been non receptive to directions & education to stay in his room while he is being tested for the norovirus. Respiratory panel testing on 3/4 was negative. Patient belligerent with the directions from staff to stay in his room, and non receptive to isolation precautions for questionable norovirus infection. Patient smeared feces on the walls of the unit, has been non receptive to instructions to practice hand hygiene, non receptive to directions to wear a mask, threw the mask in the thrash when it was handed to him, and repeatedly defied all unit rules & protocols. It is in the best interest of other patients on our unit & staff so as to ensure their safety, for patient to be discharged today, and to seek medical care for  his symptoms. Due to his lack of compliance with our directions, there is a  potential for him spreading any infection that he may potentially have to other patients/staff. Patient has been educated on the need to seek medical care after discharging from this facility.    The patient has also been educated on the need to call [phone], present to the [facility] or the nearest ER should his mental health symptoms return or worsen. Patient denies SI/HI, denies AVH, denies any intent or plan to harm himself or others prior to this discharge. He has been instructed to call the [facility] infectious disease clinic for an appointment for f/u with his HIV disease, and a 30 day supply of Biktarvy has been sent to his preferred pharmacy along with his other psychiatry medications. Patient refused to allow for his vital signs to be checked prior to discharge, refused to sign his discharge papers handed to him by RN. Patient has consistently exhibited antisocial personality traits through out the course of this hospitalization.   Labs were reviewed with the patient, and abnormal results were discussed with the patient.   The patient is able to verbalize their individual safety plan to this provider.   # It is recommended to the patient to continue psychiatric medications as prescribed, after discharge from the hospital.     # It is recommended to the patient to follow up with your outpatient psychiatric provider and PCP.   # It was discussed with the patient, the impact of alcohol, drugs, tobacco have been there overall psychiatric and medical wellbeing, and total abstinence from substance use was recommended the patient.ed.   # Prescriptions provided or sent directly to preferred pharmacy at discharge. Patient agreeable to plan. Given opportunity to ask questions. Appears to feel comfortable with discharge.    # In the event of worsening symptoms, the patient is instructed to call the crisis hotline, 911 and or go to the nearest ED for appropriate evaluation and treatment of symptoms. To  follow-up with primary care provider for other medical issues, concerns and or health care needs   # Patient was discharged with a plan to follow up as noted below.    Total Time spent with patient: 45 minutes   Principal Problem: MDD (major depressive disorder), recurrent, severe, with psychosis (HCC) Discharge Diagnoses: Principal Problem:   MDD (major depressive disorder), recurrent, severe, with psychosis (HCC) Active Problems:   HIV disease (HCC)   Tobacco use disorder   Vitamin D deficiency  Past Psychiatric History: See H & P  Past Medical History:  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   Intentional drug overdose (HCC) 11/26/2021   Neuromuscular disorder (HCC)    neuropathy   Neuropathy    Pre-diabetes    Rhabdomyolysis 02/19/2017   Unilateral primary osteoarthritis, left knee 12/14/2021    Past Surgical History:  Procedure Laterality Date   FOOT ARTHRODESIS Right 09/17/2019   Procedure: FUSION RIGHT LISFRANC JOINT;  Surgeon: Nadara Mustard, MD;  Location[facility] OR;  Service: Orthopedics;  Laterality: Right;   FOOT ARTHRODESIS Right 09/2019   HEMORROIDECTOMY     IR FLUORO GUIDE  CV LINE RIGHT  02/22/2017   IR US GUIDE VASC ACCESS RIGHT  02/22/2017   TOTAL KNEE ARTHROPLASTY Left 12/14/2021   Procedure: LEFT TOTAL KNEE ARTHROPLASTY;  Surgeon: Nadara Mustard, MD;  Location: [facility] OR;  Service: Orthopedics;  Laterality: Left;   TUMOR REMOVAL     From Chest   Family History:  Family History  Problem Relation Age of Onset   CAD Mother    Diabetes Father    Stroke Father    Colon cancer Maternal Aunt 46   Diabetes Maternal Aunt    Heart disease Maternal Uncle    Heart attack Maternal Uncle    Esophageal  cancer Neg Hx    Rectal cancer Neg Hx    Stomach cancer Neg Hx    Family Psychiatric  History: See H & P Social History:  Social History   Substance and Sexual Activity  Alcohol Use Yes   Comment: last drink March 2024     Social History   Substance and Sexual Activity  Drug Use Not Currently   Types: Cocaine, Marijuana   Comment: Last used cocaine March 2024; last used cannabis 2022    Social History   Socioeconomic History   Marital status: Married    Spouse name: Not on file   Number of children: 1   Years of education: Not on file   Highest education level: Not on file  Occupational History   Not on file  Tobacco Use   Smoking status: Former    Current packs/day: 0.50    Average packs/day: 0.5 packs/day for 25.0 years (12.5 ttl pk-yrs)    Types: E-cigarettes, Cigarettes   Smokeless tobacco: Never   Tobacco comments:    Vapes daily  Vaping Use   Vaping status: Every Day   Start date: 12/09/2020   Substances: Nicotine  Substance and Sexual Activity   Alcohol use: Yes    Comment: last drink March 2024   Drug use: Not Currently    Types: Cocaine, Marijuana    Comment: Last used cocaine March 2024; last used cannabis 2022   Sexual activity: Yes    Partners: Female  Other Topics Concern   Not on file  Social History Narrative   Lives home alone. 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   Social Drivers of Corporate investment banker Strain: Not on file  Food Insecurity: No Food Insecurity (03/10/2023)   Hunger Vital Sign    Worried About Running Out of Food in the Last Year: Never true    Ran Out of Food in the Last Year: Never true  Transportation Needs: Unmet Transportation Needs (03/10/2023)   PRAPARE - Administrator, Civil Service (Medical): Yes    Lack of Transportation (Non-Medical): Yes  Physical Activity: Not on file  Stress: Not on file  Social Connections: Not on file   Physical Findings: AIMS: 0 CIWA: n/a COWS:  n/a  Musculoskeletal: Strength & Muscle Tone: within normal limits Gait & Station: normal Patient leans: N/A   Psychiatric Specialty Exam:  Presentation  General Appearance:  Disheveled  Eye Contact: Fair  Speech: Clear and Coherent  Speech Volume: Normal  Handedness: Right   Mood and Affect  Mood: Euthymic  Affect: Congruent   Thought Process  Thought Processes: Coherent  Descriptions of Associations:Intact  Orientation:Full (Time, Place and Person)  Thought Content:Logical  History of Schizophrenia/Schizoaffective disorder:No  Duration of Psychotic Symptoms:N/A  Hallucinations:No data recorded  Ideas of Reference:None  Suicidal Thoughts:No data  recorded  Homicidal Thoughts:No data recorded   Sensorium  Memory: Immediate Fair  Judgment: Fair  Insight: Fair   Chartered certified accountant: Fair  Attention Span: Fair  Recall: [employer] of Knowledge: Fair  Language: Fair   Psychomotor Activity  Psychomotor Activity: No data recorded   Assets  Assets: Resilience   Sleep  Sleep: No data recorded    Physical Exam: Physical Exam Vitals and nursing note reviewed.  Constitutional:      Appearance: Normal appearance. He is not diaphoretic.  HENT:     Head: Normocephalic.     Nose: Nose normal.  Eyes:     Pupils: Pupils are equal, round, and reactive to light.  Musculoskeletal:     Cervical back: Normal range of motion.  Neurological:     General: No focal deficit present.     Mental Status: He is alert and oriented to person, place, and time.  Psychiatric:        Mood and Affect: Mood normal.        Behavior: Behavior normal.        Thought Content: Thought content normal.        Judgment: Judgment normal.    Review of Systems  Psychiatric/Behavioral:  Positive for depression (Denies SIHI).    Blood pressure 107/62, pulse 65, temperature 98.1 F (36.7 C), temperature source Oral, resp. rate  18, height 6\' 2"  (1.88 m), weight 104.3 kg, SpO2 100%. Body mass index is 29.52 kg/m.   Social History   Tobacco Use  Smoking Status Former   Current packs/day: 0.50   Average packs/day: 0.5 packs/day for 25.0 years (12.5 ttl pk-yrs)   Types: E-cigarettes, Cigarettes  Smokeless Tobacco Never  Tobacco Comments   Vapes daily   Tobacco Cessation:  A prescription for an FDA-approved tobacco cessation medication was offered at discharge and the patient refused   Blood Alcohol level:  Lab Results  Component Value Date   [facility] <10 03/09/2023   ETH <10 12/04/2022    Metabolic Disorder Labs:  Lab Results  Component Value Date   HGBA1C 5.9 (H) 12/08/2022   MPG 122.63 12/08/2022   MPG 131 04/03/2022   Lab Results  Component Value Date   PROLACTIN 2.6 (L) 08/25/2022   PROLACTIN 4.8 03/30/2022   Lab Results  Component Value Date   CHOL 261 (H) 02/14/2023   TRIG 208 (H) 02/14/2023   HDL 53 02/14/2023   CHOLHDL 4.9 02/14/2023   VLDL 50 (H) 12/08/2022   LDLCALC 171 (H) 02/14/2023   LDLCALC 151 (H) 12/08/2022    See Psychiatric Specialty Exam and Suicide Risk Assessment completed by Attending Physician prior to discharge.  Discharge destination:  Home  Is patient on multiple antipsychotic therapies at discharge:  No   Has Patient had three or more failed trials of antipsychotic monotherapy by history:  No  Recommended Plan for Multiple Antipsychotic Therapies: NA  Discharge Instructions     Diet - low sodium heart healthy   Complete by: As directed    Increase activity slowly   Complete by: As directed       Allergies as of 03/14/2023       Reactions   Penicillins Other (See Comments)   Reaction type/severity unknown, childhood allergy   Latex Rash   Tape Rash        Medication List     STOP taking these medications    gabapentin 300 MG capsule Commonly known as: NEURONTIN Replaced by: gabapentin 100  MG capsule   naproxen sodium 220 MG  tablet Commonly known as: ALEVE   rivaroxaban 10 MG Tabs tablet Commonly known as: XARELTO   rosuvastatin 10 MG tablet Commonly known as: Crestor   SEROquel XR 150 MG 24 hr tablet Generic drug: QUEtiapine Fumarate       TAKE these medications      Indication  Biktarvy 50-200-25 MG Tabs tablet Generic drug: bictegravir-emtricitabine-tenofovir AF Take 1 tablet by mouth daily.  Indication: HIV Disease, please follow up with infectious disease clinic for continuity of care with this medication   gabapentin 100 MG capsule Commonly known as: NEURONTIN Take 1 capsule (100 mg total) by mouth 3 (three) times daily. Replaces: gabapentin 300 MG capsule  Indication: Peripheral Nerve Disease   mirtazapine 7.5 MG tablet Commonly known as: REMERON Take 1 tablet (7.5 mg total) by mouth at bedtime.  Indication: Major Depressive Disorder, sleep   nicotine 14 mg/24hr patch Commonly known as: NICODERM CQ - dosed in mg/24 hours Place 1 patch (14 mg total) onto the skin daily.  Indication: Nicotine Addiction   pantoprazole 40 MG tablet Commonly known as: PROTONIX Take 1 tablet (40 mg total) by mouth daily.  Indication: Gastroesophageal Reflux Disease   traZODone 50 MG tablet Commonly known as: DESYREL Take 1 tablet (50 mg total) by mouth at bedtime as needed for sleep. What changed:  medication strength how much to take  Indication: Trouble Sleeping        Follow-up Information     [facility] Go on 03/15/2023.   Why: You have a hospital follow up appointment for medication management and group therapy services on 03/15/23 at 10:00 am.  The appointment will be held in person. Contact information: [address] [place] [address] [phone]         [facility]              Follow up.   Why: Please call this provider on 03/16/23 at 10:00AM to schedule an appointment for HIV follow up Contact information: [address] [place] [address]        [facility]. Call.   Why: You may contact this facility regarding support for HIV diagnosis. Contact information: Address: [address] [place], [place] [address] Phone: [phone]   Fax: [phone]               Signed: Starleen Blue, NP 03/21/2023, 4:40 PM

## 2023-03-20 ENCOUNTER — Ambulatory Visit (INDEPENDENT_AMBULATORY_CARE_PROVIDER_SITE_OTHER): Payer: MEDICAID | Admitting: Podiatry

## 2023-03-20 ENCOUNTER — Encounter: Payer: Self-pay | Admitting: Podiatry

## 2023-03-20 DIAGNOSIS — M79675 Pain in left toe(s): Secondary | ICD-10-CM

## 2023-03-20 DIAGNOSIS — L0889 Other specified local infections of the skin and subcutaneous tissue: Secondary | ICD-10-CM | POA: Diagnosis not present

## 2023-03-20 DIAGNOSIS — M79674 Pain in right toe(s): Secondary | ICD-10-CM

## 2023-03-20 DIAGNOSIS — B351 Tinea unguium: Secondary | ICD-10-CM

## 2023-03-20 DIAGNOSIS — B353 Tinea pedis: Secondary | ICD-10-CM

## 2023-03-20 MED ORDER — CICLOPIROX 8 % EX SOLN: Freq: Every day | CUTANEOUS | 2 refills | Status: DC

## 2023-03-20 MED ORDER — KETOCONAZOLE 2 % EX CREA: 1.0000 | TOPICAL_CREAM | Freq: Every day | CUTANEOUS | 2 refills | Status: DC

## 2023-03-21 NOTE — Progress Notes (Signed)
 Subjective: Chief Complaint  Patient presents with   RFC    RM#13 RFC folloe up on fungus treatment needing medication.    58 year old male presents the office today for the above concerns.  Patient is been doing well but he did not get a refill of the antifungal medication.  No swelling or redness of the toenail sites no open lesions.  No other concerns.   Objective: AAO x3, NAD DP/PT pulses palpable bilaterally, CRT less than 3 seconds Nails are hypertrophic, dystrophic, brittle, discolored, elongated 10. No surrounding redness or drainage. Tenderness nails 1-5 bilaterally. No open lesions or pre-ulcerative lesions are identified today. There is mild interdigital tinea pedis present with dry, peeling skin present.  There is no open lesions there is no drainage. No pain with calf compression, swelling, warmth, erythema  Assessment: Symptomatic onychomycosis, tinea pedis  Plan: Symptomatic onychomycosis -Sharply debrided nails x 10 without any complications or bleeding -Continue Penlac - refilled today  Tinea pedis -Prescribed ketoconazole  Neuropathy -Continue gabapentin  Vivi Barrack DPM

## 2023-03-30 ENCOUNTER — Encounter: Payer: Self-pay | Admitting: Orthopedic Surgery

## 2023-04-03 ENCOUNTER — Other Ambulatory Visit: Payer: MEDICAID

## 2023-04-09 ENCOUNTER — Telehealth: Payer: MEDICAID | Admitting: Family Medicine

## 2023-04-09 DIAGNOSIS — A084 Viral intestinal infection, unspecified: Secondary | ICD-10-CM

## 2023-04-09 MED ORDER — ONDANSETRON 4 MG PO TBDP
4.0000 mg | ORAL_TABLET | Freq: Three times a day (TID) | ORAL | 0 refills | Status: AC | PRN
  Filled 2023-04-09: qty 20, 7d supply, fill #0

## 2023-04-09 NOTE — Progress Notes (Signed)
 Virtual Visit Consent   Dylan [name], you are scheduled for a virtual visit with a [facility] provider today. Just as with appointments in the office, your consent must be obtained to participate. Your consent will be active for this visit and any virtual visit you may have with one of our providers in the next 365 days. If you have a MyChart account, a copy of this consent can be sent to you electronically.  As this is a virtual visit, video technology does not allow for your provider to perform a traditional examination. This may limit your provider's ability to fully assess your condition. If your provider identifies any concerns that need to be evaluated in person or the need to arrange testing (such as labs, EKG, etc.), we will make arrangements to do so. Although advances in technology are sophisticated, we cannot ensure that it will always work on either your end or our end. If the connection with a video visit is poor, the visit may have to be switched to a telephone visit. With either a video or telephone visit, we are not always able to ensure that we have a secure connection.  By engaging in this virtual visit, you consent to the provision of healthcare and authorize for your insurance to be billed (if applicable) for the services provided during this visit. Depending on your insurance coverage, you may receive a charge related to this service.  I need to obtain your verbal consent now. Are you willing to proceed with your visit today? Dylan [name] has provided verbal consent on 04/09/2023 for a virtual visit (video or telephone). Georgana Curio, FNP  Date: 04/09/2023 7:40 PM   Virtual Visit via Video Note   I, Georgana Curio, connected with  Dylan [name]  ([id number], [birth year]) on 04/09/23 at  7:30 PM EDT by a video-enabled telemedicine application and verified that I am speaking with the correct person using two identifiers.  Location: Patient: Virtual Visit Location Patient:  Home Provider: Virtual Visit Location Provider: Home Office   I discussed the limitations of evaluation and management by telemedicine and the availability of in person appointments. The patient expressed understanding and agreed to proceed.    History of Present Illness: Dylan [name] is a 58 y.o. who identifies as a male who was assigned male at birth, and is being seen today for nausea, vomiting and diarrhea that started this am. He is now able to keep down fluids. No fever or abd pain just cramping. He requests a work note for today. He says he is urinating. Marland Kitchen  HPI: HPI  Problems:  Patient Active Problem List   Diagnosis Date Noted   MDD (major depressive disorder), recurrent, severe, with psychosis (HCC) 03/12/2023   Transaminitis 01/04/2023   Bipolar 1 disorder, depressed (HCC) 12/05/2022   Non-traumatic rhabdomyolysis 12/04/2022   Weakness 11/03/2022   Cough 11/03/2022   Essential hypertension 11/03/2022   Peripheral neuropathy 11/03/2022   Bipolar disorder (HCC) 08/27/2022   Cannabis use disorder 08/27/2022   Bipolar disorder, most recent episode depressed (HCC) 08/26/2022   Suicidal ideation 08/26/2022   Rhabdomyolysis 07/01/2022   Non-ST elevated myocardial infarction (HCC) 06/03/2022   Dyslipidemia 06/02/2022   Polysubstance abuse (HCC) 06/02/2022   Obesity (BMI 30-39.9) 06/02/2022   Alcohol use disorder 04/12/2022   PTSD (post-traumatic stress disorder) 04/12/2022   S/P total knee arthroplasty, left 12/14/2021   Total knee replacement status, left 12/14/2021   Chronic fatigue 11/16/2021   Abnormal EKG 11/16/2021   Overweight (BMI 25.0-29.9)  11/16/2021   Elevated LFTs 07/03/2021   Right foot ulcer (HCC) 07/03/2021   Onychomycosis 04/19/2021   Stimulant use disorder 03/16/2021   Nodular radiologic density 02/02/2021   Lesion of liver 02/02/2021   Liver disease 02/02/2021   Vitamin D deficiency 01/18/2021   Methamphetamine abuse (HCC) 01/17/2021   Lesion of  pancreas 01/17/2021   Substance induced mood disorder (HCC) 10/26/2020   Fatty liver 03/09/2020   Generalized anxiety disorder 02/03/2020   Lisfranc dislocation, right, initial encounter    Prediabetes 07/22/2019   Tobacco use disorder 07/10/2018   AKI (acute kidney injury) (HCC)    Chronic 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 coma (HCC)    Acute hepatitis 02/19/2017   Degenerative disc disease 08/21/2011   HIV disease (HCC) 08/17/2011    Allergies:  Allergies  Allergen Reactions   Penicillins Other (See Comments)    Reaction type/severity unknown, childhood allergy   Latex Rash   Tape Rash   Medications:  Current Outpatient Medications:    bictegravir-emtricitabine-tenofovir AF (BIKTARVY) 50-200-25 MG TABS tablet, Take 1 tablet by mouth daily., Disp: 30 tablet, Rfl: 0   ciclopirox (PENLAC) 8 % solution, Apply topically at bedtime. Apply over nail and surrounding skin. Apply daily over previous coat. After seven (7) days, may remove with alcohol and continue cycle., Disp: 6.6 mL, Rfl: 2   gabapentin (NEURONTIN) 100 MG capsule, Take 1 capsule (100 mg total) by mouth 3 (three) times daily., Disp: 90 capsule, Rfl: 0   ketoconazole (NIZORAL) 2 % cream, Apply 1 Application topically daily., Disp: 60 g, Rfl: 2   mirtazapine (REMERON) 7.5 MG tablet, Take 1 tablet (7.5 mg total) by mouth at bedtime. (Patient not taking: Reported on 03/20/2023), Disp: 30 tablet, Rfl: 0   nicotine (NICODERM CQ - DOSED IN MG/24 HOURS) 14 mg/24hr patch, Place 1 patch (14 mg total) onto the skin daily. (Patient not taking: Reported on 03/20/2023), Disp: 28 patch, Rfl: 0   pantoprazole (PROTONIX) 40 MG tablet, Take 1 tablet (40 mg total) by mouth daily., Disp: 30 tablet, Rfl: 0   traZODone (DESYREL) 50 MG tablet, Take 1 tablet (50 mg total) by mouth at bedtime as needed for sleep., Disp: 30 tablet, Rfl: 0  Observations/Objective: Patient is well-developed, well-nourished in no acute distress.  Resting  comfortably  at home.  Head is normocephalic, atraumatic.  No labored breathing.  Speech is clear and coherent with logical content.  Patient is alert and oriented at baseline.    Assessment and Plan: 1. Viral gastroenteritis (Primary)  Increase fluids, no milk or dairy, UC if sx worsen.   Follow Up Instructions: I discussed the assessment and treatment plan with the patient. The patient was provided an opportunity to ask questions and all were answered. The patient agreed with the plan and demonstrated an understanding of the instructions.  A copy of instructions were sent to the patient via MyChart unless otherwise noted below.     The patient was advised to call back or seek an in-person evaluation if the symptoms worsen or if the condition fails to improve as anticipated.    Georgana Curio, FNP

## 2023-04-09 NOTE — Patient Instructions (Signed)
 Viral Gastroenteritis, Adult  Viral gastroenteritis is also known as the stomach flu. This condition may affect your stomach, small intestine, and large intestine. It can cause sudden watery diarrhea, fever, and vomiting. This condition is caused by many different viruses. These viruses can be passed from person to person very easily (are contagious). Diarrhea and vomiting can make you feel weak and cause you to become dehydrated. You may not be able to keep fluids down. Dehydration can make you tired and thirsty, cause you to have a dry mouth, and decrease how often you urinate. It is important to replace the fluids that you lose from diarrhea and vomiting. What are the causes? Gastroenteritis is caused by many viruses, including rotavirus and norovirus. Norovirus is the most common cause in adults. You can get sick after being exposed to the viruses from other people. You can also get sick by: Eating food, drinking water, or touching a surface contaminated with one of these viruses. Sharing utensils or other personal items with an infected person. What increases the risk? You are more likely to develop this condition if you: Have a weak body defense system (immune system). Live with one or more children who are younger than 2 years. Live in a nursing home. Travel on cruise ships. What are the signs or symptoms? Symptoms of this condition start suddenly 1-3 days after exposure to a virus. Symptoms may last for a few days or for as long as a week. Common symptoms include watery diarrhea and vomiting. Other symptoms include: Fever. Headache. Fatigue. Pain in the abdomen. Chills. Weakness. Nausea. Muscle aches. Loss of appetite. How is this diagnosed? This condition is diagnosed with a medical history and physical exam. You may also have a stool test to check for viruses or other infections. How is this treated? This condition typically goes away on its own. The focus of treatment is to  prevent dehydration and restore lost fluids (rehydration). This condition may be treated with: An oral rehydration solution (ORS) to replace important salts and minerals (electrolytes) in your body. Take this if told by your health care provider. This is a drink that is sold at pharmacies and retail stores. Medicines to help with your symptoms. Probiotic supplements to reduce symptoms of diarrhea. Fluids given through an IV, if dehydration is severe. Older adults and people with other diseases or a weak immune system are at higher risk for dehydration. Follow these instructions at home: Eating and drinking  Take an ORS as told by your health care provider. Drink clear fluids in small amounts as you are able. Clear fluids include: Water. Ice chips. Diluted fruit juice. Low-calorie sports drinks. Drink enough fluid to keep your urine pale yellow. Eat small amounts of healthy foods every 3-4 hours as you are able. This may include whole grains, fruits, vegetables, lean meats, and yogurt. Avoid fluids that contain a lot of sugar or caffeine, such as energy drinks, sports drinks, and soda. Avoid spicy or fatty foods. Avoid alcohol. General instructions  Wash your hands often, especially after having diarrhea or vomiting. If soap and water are not available, use hand sanitizer. Make sure that all people in your household wash their hands well and often. Take over-the-counter and prescription medicines only as told by your health care provider. Rest at home while you recover. Watch your condition for any changes. Take a warm bath to relieve any burning or pain from frequent diarrhea episodes. Keep all follow-up visits. This is important. Contact a health care  provider if you: Cannot keep fluids down. Have symptoms that get worse. Have new symptoms. Feel light-headed or dizzy. Have muscle cramps. Get help right away if you: Have chest pain. Have trouble breathing or you are breathing  very quickly. Have a fast heartbeat. Feel extremely weak or you faint. Have a severe headache, a stiff neck, or both. Have a rash. Have severe pain, cramping, or bloating in your abdomen. Have skin that feels cold and clammy. Feel confused. Have pain when you urinate. Have signs of dehydration, such as: Dark urine, very little urine, or no urine. Cracked lips. Dry mouth. Sunken eyes. Sleepiness. Weakness. Have signs of bleeding, such as: Seeing blood in your vomit. Having vomit that looks like coffee grounds. Having bloody or black stools or stools that look like tar. These symptoms may be an emergency. Get help right away. Call 911. Do not wait to see if the symptoms will go away. Do not drive yourself to the hospital. Summary Viral gastroenteritis is also known as the stomach flu. It can cause sudden watery diarrhea, fever, and vomiting. This condition can be passed from person to person very easily (is contagious). Take an oral rehydration solution (ORS) if told by your health care provider. This is a drink that is sold at pharmacies and retail stores. Wash your hands often, especially after having diarrhea or vomiting. If soap and water are not available, use hand sanitizer. This information is not intended to replace advice given to you by your health care provider. Make sure you discuss any questions you have with your health care provider. Document Revised: 10/25/2020 Document Reviewed: 10/25/2020 Elsevier Patient Education  2024 [employer].

## 2023-04-10 ENCOUNTER — Encounter: Payer: Self-pay | Admitting: Physician Assistant

## 2023-04-10 ENCOUNTER — Other Ambulatory Visit (HOSPITAL_COMMUNITY): Payer: Self-pay

## 2023-04-18 ENCOUNTER — Ambulatory Visit: Payer: MEDICAID | Admitting: Family Medicine

## 2023-04-21 ENCOUNTER — Other Ambulatory Visit: Payer: Self-pay

## 2023-04-21 ENCOUNTER — Emergency Department (HOSPITAL_COMMUNITY)
Admission: EM | Admit: 2023-04-21 | Discharge: 2023-04-23 | Disposition: A | Discharge status: Home / Self Care | Payer: MEDICAID | Attending: Emergency Medicine | Admitting: Emergency Medicine

## 2023-04-21 ENCOUNTER — Other Ambulatory Visit (HOSPITAL_COMMUNITY): Payer: Self-pay

## 2023-04-21 DIAGNOSIS — F319 Bipolar disorder, unspecified: Secondary | ICD-10-CM | POA: Insufficient documentation

## 2023-04-21 DIAGNOSIS — R45851 Suicidal ideations: Secondary | ICD-10-CM

## 2023-04-21 DIAGNOSIS — F1914 Other psychoactive substance abuse with psychoactive substance-induced mood disorder: Secondary | ICD-10-CM | POA: Insufficient documentation

## 2023-04-21 DIAGNOSIS — Z79899 Other long term (current) drug therapy: Secondary | ICD-10-CM | POA: Diagnosis not present

## 2023-04-21 DIAGNOSIS — Z91040 Latex allergy status: Secondary | ICD-10-CM | POA: Insufficient documentation

## 2023-04-21 DIAGNOSIS — Z21 Asymptomatic human immunodeficiency virus [HIV] infection status: Secondary | ICD-10-CM | POA: Insufficient documentation

## 2023-04-21 LAB — COMPREHENSIVE METABOLIC PANEL WITH GFR
ALT: 38 U/L (ref 0–44)
AST: 52 U/L — ABNORMAL HIGH (ref 15–41)
Albumin: 3.7 g/dL (ref 3.5–5.0)
Alkaline Phosphatase: 66 U/L (ref 38–126)
Anion gap: 10 (ref 5–15)
BUN: 18 mg/dL (ref 6–20)
CO2: 25 mmol/L (ref 22–32)
Calcium: 9.1 mg/dL (ref 8.9–10.3)
Chloride: 105 mmol/L (ref 98–111)
Creatinine, Ser: 1.03 mg/dL (ref 0.61–1.24)
GFR, Estimated: >60 mL/min (ref >60)
Glucose, Bld: 55 mg/dL — ABNORMAL LOW (ref 70–99)
Potassium: 3.7 mmol/L (ref 3.5–5.1)
Sodium: 140 mmol/L (ref 135–145)
Total Bilirubin: 0.4 mg/dL (ref 0.0–1.2)
Total Protein: 6.7 g/dL (ref 6.5–8.1)

## 2023-04-21 LAB — RAPID URINE DRUG SCREEN, HOSP PERFORMED
Amphetamines: NOT DETECTED
Barbiturates: NOT DETECTED
Benzodiazepines: NOT DETECTED
Cocaine: POSITIVE — AB
Opiates: NOT DETECTED
Tetrahydrocannabinol: NOT DETECTED

## 2023-04-21 LAB — CBC
HCT: 40.5 % (ref 39.0–52.0)
Hemoglobin: 13.2 g/dL (ref 13.0–17.0)
MCH: 30.6 pg (ref 26.0–34.0)
MCHC: 32.6 g/dL (ref 30.0–36.0)
MCV: 94 fL (ref 80.0–100.0)
Platelets: 264 10*3/uL (ref 150–400)
RBC: 4.31 MIL/uL (ref 4.22–5.81)
RDW: 12.7 % (ref 11.5–15.5)
WBC: 6.9 10*3/uL (ref 4.0–10.5)
nRBC: 0 % (ref 0.0–0.2)

## 2023-04-21 LAB — ACETAMINOPHEN LEVEL: Acetaminophen (Tylenol), Serum: <10 ug/mL — ABNORMAL LOW (ref 10–30)

## 2023-04-21 LAB — ETHANOL: Alcohol, Ethyl (B): 12 mg/dL — ABNORMAL HIGH (ref <10)

## 2023-04-21 LAB — SALICYLATE LEVEL: Salicylate Lvl: <7 mg/dL — ABNORMAL LOW (ref 7.0–30.0)

## 2023-04-21 MED ORDER — ONDANSETRON HCL 4 MG/2ML IJ SOLN
4.0000 mg | Freq: Once | INTRAMUSCULAR | Status: AC
  Administered 2023-04-22: 4 mg via INTRAVENOUS
  Filled 2023-04-21: qty 2

## 2023-04-21 MED ORDER — TRAZODONE HCL 50 MG PO TABS
50.0000 mg | ORAL_TABLET | Freq: Every day | ORAL | Status: DC
  Administered 2023-04-21 – 2023-04-22 (×2): 50 mg via ORAL
  Filled 2023-04-21 (×2): qty 1

## 2023-04-21 MED ORDER — GABAPENTIN 100 MG PO CAPS
100.0000 mg | ORAL_CAPSULE | Freq: Two times a day (BID) | ORAL | Status: DC
  Administered 2023-04-21 – 2023-04-23 (×5): 100 mg via ORAL
  Filled 2023-04-21 (×5): qty 1

## 2023-04-21 MED ORDER — LACTATED RINGERS IV BOLUS
1000.0000 mL | Freq: Once | INTRAVENOUS | Status: AC
  Administered 2023-04-22: 1000 mL via INTRAVENOUS

## 2023-04-21 MED ORDER — BIKTARVY 50-200-25 MG PO TABS
1.0000 | ORAL_TABLET | Freq: Every day | ORAL | 0 refills | Status: DC
  Filled 2023-04-21: qty 30, 30d supply, fill #0

## 2023-04-21 MED ORDER — LOPERAMIDE HCL 2 MG PO CAPS
4.0000 mg | ORAL_CAPSULE | Freq: Once | ORAL | Status: AC
  Administered 2023-04-21: 4 mg via ORAL
  Filled 2023-04-21: qty 2

## 2023-04-21 NOTE — ED Notes (Signed)
 Pts phone was taken from him/ pt began to argue but this nurse de-escalated the situation/ pt is calm and cooperative at this time/ pts phone was placed into a secured bag and placed in locker

## 2023-04-21 NOTE — Progress Notes (Addendum)
 LCSW Progress Note:   MRN: [id number]  Dylan [name]  04/21/2023 12:41 PM  Per, Judah North, NP, patient meets the criteria for inpatient psychiatric treatment. The patient was referred to [facility], and a request was made for [facility] Zackary Heron, RN) to review the patient for admission. However, Loris Ros confirmed that [facility] does not have an appropriate bed available. The patient has been faxed to the following facilities for consideration of bed placement.  Destination  Service Provider   Address Phone Fax Patient Preferred  [facility] Patient Placement   [facility] [place], [place] [phone] --  [facility]   [place] [address] [phone] --  [facility]   [address] [place], [place] [address] [phone] --  [facility]   [address] [place], [place] [address] [phone] --  [facility]   [address], [place] [address] [phone] --  [facility]   [address] [place] [address] [phone] --  [facility]   [address] [place] [address] [phone] --  [facility]   [address], [place] [address] [phone] [facility]   [place] [phone] --  [facility]   [address] [place] [address] [phone] --  [facility]   [address], [place] [address] [phone] --  [facility]   [address], [place] [address] [phone] --  [address] EFAX   [address] Sharren Decree [place] [phone] --  [facility]   [address],  [place] [address] [phone] --  [facility]   [address], [place] [address] [phone] --  [facility]   [address], [place] [address] [phone] --  [facility]   [address], [place] [address] [phone] --  [facility]   [address] [place], [place] [address] [phone] --  [facility]   [address], [place] [address] [phone] --  [facility]   [place] [phone] --  [facility]   [address], [place] [address] [phone] --  [facility]   [address], [place] [address] [phone] --  [facility] [address]   [address] [place] [address] [phone] --  [facility]   [address] [place] [address] [phone] --  [facility]   [address] [place] [address] [phone] --  [facility]   [address], [place] [address] [phone] --  [facility]   [address] [place] [address] [phone] --  [facility]   [address] [place] [address] [phone] --  [facility]   [address], [place] [address] [phone] --  [facility]   [place] [phone] -- --

## 2023-04-21 NOTE — ED Provider Notes (Signed)
 [place] EMERGENCY DEPARTMENT AT [facility] Provider Note   CSN: [id number] Arrival date & time: 04/21/23  0235     History  Chief Complaint  Patient presents with   Suicidal    Dylan [name] is a 58 y.o. male.  The history is provided by the patient and medical records.    57 year old male with history of bipolar disorder, polysubstance abuse, chronic fatigue, HIV, PTSD, presenting to the ED with suicidal ideation.  He reports negative thoughts recently, had plan to jump off of a bridge.  He reports stress with his family members is causing worsening depression.  He denies any HI/AVH.  Home Medications Prior to Admission medications   Medication Sig Start Date End Date Taking? Authorizing Provider  bictegravir-emtricitabine-tenofovir AF (BIKTARVY) 50-200-25 MG TABS tablet Take 1 tablet by mouth daily. 03/15/23   Robet Chiquito, NP  ciclopirox (PENLAC) 8 % solution Apply topically at bedtime. Apply over nail and surrounding skin. Apply daily over previous coat. After seven (7) days, may remove with alcohol and continue cycle. 03/20/23   Charity Conch, DPM  gabapentin (NEURONTIN) 100 MG capsule Take 1 capsule (100 mg total) by mouth 3 (three) times daily. 03/14/23   Robet Chiquito, NP  ketoconazole (NIZORAL) 2 % cream Apply 1 Application topically daily. 03/20/23   Charity Conch, DPM  mirtazapine (REMERON) 7.5 MG tablet Take 1 tablet (7.5 mg total) by mouth at bedtime. Patient not taking: Reported on 03/20/2023 03/14/23   Robet Chiquito, NP  nicotine (NICODERM CQ - DOSED IN MG/24 HOURS) 14 mg/24hr patch Place 1 patch (14 mg total) onto the skin daily. Patient not taking: Reported on 03/20/2023 03/15/23   Robet Chiquito, NP  pantoprazole (PROTONIX) 40 MG tablet Take 1 tablet (40 mg total) by mouth daily. 03/15/23   Robet Chiquito, NP  traZODone (DESYREL) 50 MG tablet Take 1 tablet (50 mg total) by mouth at bedtime as needed for sleep. 03/14/23   Robet Chiquito, NP       Allergies    Penicillins, Latex, and Tape    Review of Systems   Review of Systems  Psychiatric/Behavioral:  Positive for suicidal ideas.   All other systems reviewed and are negative.   Physical Exam Updated Vital Signs BP 110/72 (BP Location: Right Arm)   Pulse 79   Temp 97.6 F (36.4 C)   Resp 15   SpO2 99%  Physical Exam Vitals and nursing note reviewed.  Constitutional:      Appearance: He is well-developed.  HENT:     Head: Normocephalic and atraumatic.  Eyes:     Conjunctiva/sclera: Conjunctivae normal.     Pupils: Pupils are equal, round, and reactive to light.  Cardiovascular:     Rate and Rhythm: Normal rate and regular rhythm.     Heart sounds: Normal heart sounds.  Pulmonary:     Effort: Pulmonary effort is normal.     Breath sounds: Normal breath sounds.  Abdominal:     General: Bowel sounds are normal.     Palpations: Abdomen is soft.  Musculoskeletal:        General: Normal range of motion.     Cervical back: Normal range of motion.  Skin:    General: Skin is warm and dry.  Neurological:     Mental Status: He is alert and oriented to person, place, and time.  Psychiatric:     Comments: Answered in very shortened phrases, essentially no eye contact, SI with plan to jump off  bridge Denies HI/AVH     ED Results / Procedures / Treatments   Labs (all labs ordered are listed, but only abnormal results are displayed) Labs Reviewed  COMPREHENSIVE METABOLIC PANEL WITH GFR - Abnormal; Notable for the following components:      Result Value   Glucose, Bld 55 (*)    AST 52 (*)    All other components within normal limits  ETHANOL - Abnormal; Notable for the following components:   Alcohol, Ethyl (B) 12 (*)    All other components within normal limits  SALICYLATE LEVEL - Abnormal; Notable for the following components:   Salicylate Lvl <7.0 (*)    All other components within normal limits  ACETAMINOPHEN LEVEL - Abnormal; Notable for the following  components:   Acetaminophen (Tylenol), Serum <10 (*)    All other components within normal limits  CBC  RAPID URINE DRUG SCREEN, HOSP PERFORMED    EKG None  Radiology No results found.  Procedures Procedures    Medications Ordered in ED Medications - No data to display  ED Course/ Medical Decision Making/ A&P                                 Medical Decision Making Amount and/or Complexity of Data Reviewed Labs: ordered. ECG/medicine tests: ordered and independent interpretation performed.   57 year old male here with suicidal ideation with plan to jump off a bridge.  He notes family stressors as a source of contention and feels like his depression is worsening.  Denies any HI or hallucinations.  Labs were obtained here and are grossly reassuring without leukocytosis or electrolyte derangement.  Ethanol is 12.  Negative Tylenol and salicylate level.  UDS is pending.  Medically cleared.  Will get TTS evaluation.  Final Clinical Impression(s) / ED Diagnoses Final diagnoses:  Suicidal ideation    Rx / DC Orders ED Discharge Orders     None         [name], PA-C 04/21/23 0544    Roberts Ching, MD 04/22/23 [phone]

## 2023-04-21 NOTE — Progress Notes (Addendum)
 LCSW Progress Note:    MRN: [id number]  Dylan [name]  4/12/20254 5:02 PM  @1700 : Per [facility] Intake Coordinator, [place], the patient has been accepted for admission today, 04/21/2023. A bed is currently available. The patient is accepted to [facility] and should report to the 100 Unit upon arrival. The accepting provider is Dr. Arvil Lauber.  The patient's care team has provided disposition updates.  For nurse-to-nurse report, please call: [phone].

## 2023-04-21 NOTE — ED Notes (Signed)
 [facility] was contacted to schedule transportation to [facility] in [place].  I was informed that they do not transport that far after 5:00pm. This information was also shared with the nurse on duty.

## 2023-04-21 NOTE — ED Notes (Signed)
 Report was given to Cozette Divine, RN at [phone] at [facility]

## 2023-04-21 NOTE — ED Notes (Signed)
 IVC paperwork complete and in purple zone, expires 04/28/23, case # [id number]

## 2023-04-21 NOTE — ED Notes (Signed)
Patient refuse morning vitals

## 2023-04-21 NOTE — Progress Notes (Signed)
 LCSW Progress Note:    MRN: [id number]  Dylan [name]  4/12/20254 3:47 PM  Received a call from intake coordinator with [facility], [place], she explains that their facility is considering the patient for admission but inquired whether the hospital will provide a two-week supply of the patient's HIV medications. Patient's nurse has been notified, and has reached out to the ED Provider, awaiting updates. Will call the intake coordinator back at [facility] with updates once they are provided by patient's nurse. The contact number for [facility] is [phone].

## 2023-04-21 NOTE — ED Notes (Signed)
 Ava, Clinician from [facility] was made aware patient will not be coming tonight, because safe transport do not take patient at a certain distance after 1700.

## 2023-04-21 NOTE — ED Notes (Signed)
 Patient was suppose to be transported to [facility] this evening. Patient told the nurse he needed his cell phone to call his family to let them know he is leaving today. Per the patient "I need a pen and paper to get the numbers out my phone". I left the unit to take another patient out to safe transport and  whenever I got back into the unit I was told by the sitter the patient was on the phone talking. I went into the patient room and let him know he cannot be on his personal phone. The patient shouted "do you see me talking. Security was called to the unit and the nurse was told by [employer] the patient was going to sign his self out. Dr Linder Revere was made aware.

## 2023-04-21 NOTE — Consult Note (Signed)
 [facility] Psychiatric Consult Initial  Patient Name: .Dylan [name]  MRN: [id number]  DOB: 05-Jun-1965  Consult Order details:  Orders (From admission, onward)     Start     Ordered   04/21/23 0427  CONSULT TO CALL ACT TEAM       Ordering Provider: [name], PA-C  Provider:  (Not yet assigned)  Question:  Reason for Consult?  Answer:  Psych consult   04/21/23 0426             Mode of Visit: In person    Psychiatry Consult Evaluation  Service Date: April 21, 2023 LOS:  LOS: 0 days  Chief Complaint Suicidal Ideations  Primary Psychiatric Diagnoses  Suicidal ideations 2.  Bipolar disorder 3.  Substance-induced mood disorder  Assessment  Dylan [name] is a 58 y.o. male admitted: Presented to the EDfor suicidal ideations with a plan to jump off a bridge 04/21/2023  2:36 AM. He carries the psychiatric diagnoses of bipolar disorder, substance-induced mood disorder, suicidal ideations, polysubstance abuse.  And has a past medical history of hepatitis C, HIV chronic fatigue.   His current presentation of depression is most consistent with bipolar substance-induced mood disorder. He meets criteria for inpatient admission based on suicidal ideations with a plan to jump off a bridge.  Current outpatient psychotropic medications include gabapentin, trazodone and historically he has had inconsistency with medication.  On initial examination, patient reports he has not taken medication since his last discharge 1 month ago.Aaron Aas Please see plan below for detailed recommendations.   Diagnoses:  Active Hospital problems: Principal Problem:   Suicidal ideation    Plan   ## Psychiatric Medication Recommendations:  Inpatient admission  ## Medical Decision Making Capacity: Not specifically addressed in this encounter  ## Further Work-up:  --  EKG, U/A, or UDS -- most recent EKG on 04/24/2023 had QtC of 387 -- Pertinent labwork reviewed earlier this admission includes: Alcohol=  12   ## Disposition:-- We recommend inpatient psychiatric hospitalization when medically cleared. Patient is under voluntary admission status at this time; please IVC if attempts to leave hospital.  ## Behavioral / Environmental: - No specific recommendations at this time.     ## Safety and Observation Level:  - Based on my clinical evaluation, I estimate the patient to be at moderate risk of self harm in the current setting. - At this time, we recommend  routine. This decision is based on my review of the chart including patient's history and current presentation, interview of the patient, mental status examination, and consideration of suicide risk including evaluating suicidal ideation, plan, intent, suicidal or self-harm behaviors, risk factors, and protective factors. This judgment is based on our ability to directly address suicide risk, implement suicide prevention strategies, and develop a safety plan while the patient is in the clinical setting. Please contact our team if there is a concern that risk level has changed.  CSSR Risk Category:C-SSRS RISK CATEGORY: High Risk  Suicide Risk Assessment: Patient has following modifiable risk factors for suicide: active suicidal ideation and medication noncompliance, which we are addressing by inpatient admission possibly following up with [facility] urgent care facility and/or [facility]. Patient has following non-modifiable or demographic risk factors for suicide: male gender and psychiatric hospitalization Patient has the following protective factors against suicide: Frustration tolerance  Thank you for this consult request. Recommendations have been communicated to the primary team.  We will recommend inpatient admission at this time.   Levester Reagin, NP  History of Present Illness  Relevant Aspects of Hospital ED Course:  Admitted on 04/21/2023 for overnight observation,Dylan [name] is well-known to this service. he was seen and  evaluated face-to-face by this provider.  Continues to endorse suicidal ideations with a plan to jump off a bridge.  He reports ongoing ruminations related to the passing of her daughter.  States drinking alcohol in order to build encourage to jump off a bridge.  Patient was recently seen and evaluated and discharged from inpatient admission 03/2023.  When she reports possibly contracting norovirus which she was unable to complete his stay.  States" they did not keep me long."  Denied that he is followed up with therapy or psychiatry since his discharge.     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  Unsure if he has go back to work at this time due to his mental health.  During previous hospitalization he was discharged on Zyprexa 10 mg for mood stabilization, mirtazapine 7.5 mg and trazodone 50 mg for sleep disturbance.    During evaluation Dylan [name] is resting in bed.  Per nursing staff patient has been uncooperative with vitals; patient reports  "[place] is racist , I see that I am have to move to a new state to get help." he is alert/oriented x 3; and mood congruent with affect.  Patient is speaking in a clear tone at moderate volume, and normal pace; with minimal eye contact. His thought process is coherent and relevant;   There is no indication that  he is currently responding to internal/external stimuli or experiencing delusional thought content.  Patient denies suicidal/self-harm/homicidal ideation, psychosis, and paranoia.  Patient has remained calm throughout assessment and has answered questions appropriately.   Patient Report:    Psych ROS:  Depression: Reported increased depression, suicidal ideations Mania (lifetime and current): Denied Psychosis: (lifetime and current): Denied   ROS   Psychiatric and Social History  Psychiatric History:  Information collected from previous admission to 2/ 58  "Prev Dx/Sx: polysubstance abuse, bipolar  disorder, PTSD and GAD Current Psych Provider: none  Home Meds (current): seroquel, trazodone Previous Med Trials: many Therapy: endorses   Prior Psych Hospitalization: yes, many  Prior Self Harm: multiple previous attempts - OD, held gun to head, plan to jump off parking deck Prior Violence: denies   Family Psych History: Mom - mental issues, doesn't know dx; Uncle - schizophrenia Family Hx suicide: denies   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2 F (36.8 C), temperature source Oral, resp. rate 18, SpO2 92%. There is no height or weight on file to calculate BMI.  Physical Exam  Mental Status Exam: General Appearance: Disheveled paper scrubs-blue  Orientation:  [name] (Time,  Place, and Person)  Memory:  Immediate;   Good Recent;   Good  Concentration:  Concentration: Good  Recall:  Good  Attention  Fair  Eye Contact:  Minimal  Speech:  Clear and Coherent  Language:  Good  Volume:  Normal  Mood: irritable  Affect:  Congruent  Thought Process:  Coherent  Thought Content:  Logical  Suicidal Thoughts: Yes  Homicidal Thoughts:  No  Judgement:  Fair  Insight:  Fair  Psychomotor Activity:  Normal  Akathisia:  No  Fund of  Knowledge:  Good      Assets:  Communication Skills Desire for Improvement Resilience Social Support  Cognition:  WNL  ADL's:  Intact  AIMS (if indicated):        Other History   These have been pulled in through the EMR, reviewed, and updated if appropriate.  Family History:  The patient's family history includes CAD in his mother; Colon cancer (age of onset: 51) in his maternal aunt; Diabetes in his father and maternal aunt; Heart attack in his maternal uncle; Heart disease in his maternal uncle; Stroke in his father.  Medical History: Past Medical History:  Diagnosis Date  . Anxiety   . Arthritis   . Bipolar 1 disorder (HCC)   . Colon polyps   . Depression   . GERD (gastroesophageal reflux disease)   . Hepatitis B   . Human immunodeficiency virus (HIV) (HCC)   . Hyperlipidemia   . Hypertension   . Insomnia due to other mental disorder 02/03/2020  . Intentional drug overdose (HCC) 11/26/2021  . Neuromuscular disorder (HCC)    neuropathy  . Neuropathy   . Pre-diabetes   . Rhabdomyolysis 02/19/2017  . Unilateral primary osteoarthritis, left knee 12/14/2021    Surgical History: Past Surgical History:  Procedure Laterality Date  . FOOT ARTHRODESIS Right 09/17/2019   Procedure: FUSION RIGHT LISFRANC JOINT;  Surgeon: Timothy Ford, MD;  Location: [facility] OR;  Service: Orthopedics;  Laterality: Right;  . FOOT ARTHRODESIS Right 09/2019  . HEMORROIDECTOMY    . IR FLUORO GUIDE CV LINE RIGHT  02/22/2017  . IR US  GUIDE VASC ACCESS RIGHT  02/22/2017  . TOTAL KNEE ARTHROPLASTY Left 12/14/2021   Procedure: LEFT TOTAL KNEE ARTHROPLASTY;  Surgeon: Timothy Ford, MD;  Location: [facility] OR;  Service: Orthopedics;  Laterality: Left;  . TUMOR REMOVAL     From Chest     Medications:   Current Facility-Administered Medications:  .  gabapentin (NEURONTIN) capsule 100 mg, 100 mg, Oral, BID, Sharmon Cheramie N, NP .  traZODone (DESYREL) tablet 50 mg, 50 mg, Oral, QHS, Evanne Matsunaga N,  NP  Current Outpatient Medications:  .  bictegravir-emtricitabine-tenofovir AF (BIKTARVY) 50-200-25 MG TABS tablet, Take 1 tablet by mouth daily., Disp: 30 tablet, Rfl: 0 .  ciclopirox (PENLAC) 8 % solution, Apply topically at bedtime. Apply over nail and surrounding skin. Apply daily over previous coat. After seven (7) days, may remove with alcohol and continue cycle., Disp: 6.6 mL, Rfl: 2 .  gabapentin (NEURONTIN) 100 MG capsule, Take 1 capsule (100 mg total) by mouth 3 (three) times daily., Disp: 90 capsule, Rfl: 0 .  ketoconazole (NIZORAL) 2 % cream, Apply 1 Application topically daily., Disp: 60 g, Rfl: 2 .  mirtazapine (REMERON) 7.5 MG tablet, Take 1 tablet (7.5 mg total) by mouth at bedtime. (Patient not taking: Reported on 03/20/2023), Disp: 30 tablet, Rfl: 0 .  nicotine (NICODERM CQ - DOSED IN MG/24 HOURS) 14 mg/24hr patch, Place 1  patch (14 mg total) onto the skin daily. (Patient not taking: Reported on 03/20/2023), Disp: 28 patch, Rfl: 0 .  pantoprazole (PROTONIX) 40 MG tablet, Take 1 tablet (40 mg total) by mouth daily., Disp: 30 tablet, Rfl: 0 .  traZODone (DESYREL) 50 MG tablet, Take 1 tablet (50 mg total) by mouth at bedtime as needed for sleep., Disp: 30 tablet, Rfl: 0  Allergies: Allergies  Allergen Reactions  . Penicillins Other (See Comments)    Reaction type/severity unknown, childhood allergy  . Latex Rash  . Tape Rash    Levester Reagin, NP

## 2023-04-21 NOTE — ED Notes (Signed)
 Patient at triage rm. 6.

## 2023-04-21 NOTE — ED Triage Notes (Signed)
 Patient reports suicidal ideation plans to jump from a bridge , denies hallucinations or HI.

## 2023-04-21 NOTE — ED Notes (Signed)
 Pt valuables given to security, belongings placed in locker 14

## 2023-04-21 NOTE — ED Provider Notes (Signed)
 Notified by nursing staff that patient is having profuse diarrhea since approximately 3 PM.  On the way back from the bathroom he became fatigued, lightheaded and diaphoretic and had to sit down in a chair.  Did not lose consciousness.  Complains of abdominal cramping and diarrhea.  Did not look at the diarrhea so cannot tell if it is bloody.  No fever.  Does have nausea.  No recent sick contacts or travel.  No significant abdominal pain now.  Patient tachycardic to 111.  Blood pressure 108/74.  Abdomen soft without peritoneal signs.  Does have a history of HIV.  States his counts are well-controlled.  Last CD4 count was normal but his viral load was not undetectable. States he is supposed to have surgery for colon polyps in the near future.  Hydrate, recheck labs, send stool studies.

## 2023-04-21 NOTE — ED Notes (Signed)
 Sade the Intake Coordinator from [facility] called to get information on this patient.

## 2023-04-21 NOTE — ED Notes (Signed)
 Pt unable to urinate at this time.

## 2023-04-21 NOTE — ED Notes (Signed)
 Pt continues to have diarrhea/ linen and scrubs have been changed/ offered pt a shower

## 2023-04-21 NOTE — ED Notes (Signed)
 Pt states he is having diarrhea/ pt was offered a shower/

## 2023-04-22 ENCOUNTER — Emergency Department (HOSPITAL_COMMUNITY): Payer: MEDICAID

## 2023-04-22 LAB — CBC WITH DIFFERENTIAL/PLATELET
Abs Immature Granulocytes: 0.04 10*3/uL (ref 0.00–0.07)
Basophils Absolute: 0 10*3/uL (ref 0.0–0.1)
Basophils Relative: 0 %
Eosinophils Absolute: 0.1 10*3/uL (ref 0.0–0.5)
Eosinophils Relative: 1 %
HCT: 44.1 % (ref 39.0–52.0)
Hemoglobin: 14.8 g/dL (ref 13.0–17.0)
Immature Granulocytes: 0 %
Lymphocytes Relative: 14 %
Lymphs Abs: 1.6 10*3/uL (ref 0.7–4.0)
MCH: 31 pg (ref 26.0–34.0)
MCHC: 33.6 g/dL (ref 30.0–36.0)
MCV: 92.3 fL (ref 80.0–100.0)
Monocytes Absolute: 1 10*3/uL (ref 0.1–1.0)
Monocytes Relative: 8 %
Neutro Abs: 8.8 10*3/uL — ABNORMAL HIGH (ref 1.7–7.7)
Neutrophils Relative %: 77 %
Platelets: 268 10*3/uL (ref 150–400)
RBC: 4.78 MIL/uL (ref 4.22–5.81)
RDW: 12.8 % (ref 11.5–15.5)
WBC: 11.6 10*3/uL — ABNORMAL HIGH (ref 4.0–10.5)
nRBC: 0 % (ref 0.0–0.2)

## 2023-04-22 LAB — COMPREHENSIVE METABOLIC PANEL WITH GFR
ALT: 37 U/L (ref 0–44)
AST: 42 U/L — ABNORMAL HIGH (ref 15–41)
Albumin: 3.7 g/dL (ref 3.5–5.0)
Alkaline Phosphatase: 70 U/L (ref 38–126)
Anion gap: 9 (ref 5–15)
BUN: 14 mg/dL (ref 6–20)
CO2: 22 mmol/L (ref 22–32)
Calcium: 9.2 mg/dL (ref 8.9–10.3)
Chloride: 107 mmol/L (ref 98–111)
Creatinine, Ser: 0.93 mg/dL (ref 0.61–1.24)
GFR, Estimated: >60 mL/min (ref >60)
Glucose, Bld: 93 mg/dL (ref 70–99)
Potassium: 3.6 mmol/L (ref 3.5–5.1)
Sodium: 138 mmol/L (ref 135–145)
Total Bilirubin: 0.6 mg/dL (ref 0.0–1.2)
Total Protein: 7.4 g/dL (ref 6.5–8.1)

## 2023-04-22 LAB — C DIFFICILE QUICK SCREEN W PCR REFLEX
C Diff antigen: NEGATIVE
C Diff interpretation: NOT DETECTED
C Diff toxin: NEGATIVE

## 2023-04-22 LAB — LIPASE, BLOOD: Lipase: 50 U/L (ref 11–51)

## 2023-04-22 LAB — CK
Total CK: 895 U/L — ABNORMAL HIGH (ref 49–397)
Total CK: 657 U/L — ABNORMAL HIGH (ref 49–397)

## 2023-04-22 MED ORDER — LOPERAMIDE HCL 2 MG PO CAPS
2.0000 mg | ORAL_CAPSULE | Freq: Once | ORAL | Status: AC
Start: 2023-04-22 — End: 2023-04-22
  Administered 2023-04-22: 2 mg via ORAL
  Filled 2023-04-22: qty 1

## 2023-04-22 MED ORDER — BICTEGRAVIR-EMTRICITAB-TENOFOV 50-200-25 MG PO TABS
1.0000 | ORAL_TABLET | Freq: Every day | ORAL | Status: DC
  Administered 2023-04-22 – 2023-04-23 (×2): 1 via ORAL
  Filled 2023-04-22 (×3): qty 1

## 2023-04-22 MED ORDER — LACTATED RINGERS IV BOLUS
1000.0000 mL | Freq: Once | INTRAVENOUS | Status: AC
  Administered 2023-04-22: 1000 mL via INTRAVENOUS

## 2023-04-22 MED ORDER — IOHEXOL 350 MG/ML SOLN
75.0000 mL | Freq: Once | INTRAVENOUS | Status: AC | PRN
  Administered 2023-04-22: 75 mL via INTRAVENOUS

## 2023-04-22 NOTE — ED Notes (Signed)
Pt tolerating PO fluid intake at this time.

## 2023-04-22 NOTE — ED Notes (Signed)
 Dr. Alison Irvine assessing pt/ pt to be moved to main ED

## 2023-04-22 NOTE — ED Notes (Signed)
 Pt walked our of bathroom and states " I'm about to fall out"/ pt appeared pale and was diaphoretic/ Dr. Alison Irvine asked to  come assess pt

## 2023-04-22 NOTE — Progress Notes (Signed)
 CSW spoke with the Intake Coordinator at [facility], who confirmed that the patient's bed can be held until tomorrow. CSW agreed to notify the NP and RN staff.    Lucill Mauck, MSW, LCSW-A  2:46 PM 04/22/2023

## 2023-04-22 NOTE — Consult Note (Signed)
@  1700: Per [facility] Intake Coordinator, Shade, the patient has been accepted for admission today, 04/21/2023. A bed is currently available. The patient is accepted to [facility] and should report to the 100 Unit upon arrival. The accepting provider is Dr. Arvil Lauber.  The patient's care team has provided disposition updates.  For nurse-to-nurse report, please call: [phone]

## 2023-04-22 NOTE — ED Notes (Signed)
 Pt 1:1 sitter leaving, Press photographer notified, Staffing/Sitting called and notified of situation. MD Rancour notified of situation.

## 2023-04-22 NOTE — ED Notes (Signed)
 EKG Preformed, Pt had at least 5 episodes of vomiting and one very liquid bowel movement. Stool sample collected, Pt cleaned up, and new briefs, chucks, warm blanket, and medical gown given. IV access gained, Medications given, (SEE MAR), EKG given to MD

## 2023-04-22 NOTE — ED Notes (Signed)
Pt tolerating PO fluid intake.

## 2023-04-22 NOTE — ED Provider Notes (Addendum)
 Emergency Medicine Observation Re-evaluation Note  Dylan [name] is a 58 y.o. male, seen on rounds today.  Pt initially presented to the ED for complaints of Suicidal Currently, the patient is ambulating in unit   Physical Exam  BP 102/66   Pulse 84   Temp 98.7 F (37.1 C) (Oral)   Resp 20   SpO2 99%  Physical Exam General: nad Cardiac: rr Lungs: non-labored Psych: calm  ED Course / MDM  EKG:EKG Interpretation Date/Time:  Sunday April 22 2023 00:19:59 EDT Ventricular Rate:  93 PR Interval:  149 QRS Duration:  98 QT Interval:  369 QTC Calculation: 459 R Axis:   70  Text Interpretation: Sinus tachycardia Multiple ventricular premature complexes Probable left atrial enlargement Borderline repolarization abnormality No significant change was found Confirmed by Earma Gloss [id number]) on 04/22/2023 12:34:18 AM  I have reviewed the labs performed to date as well as medications administered while in observation.  Recent changes in the last 24 hours include accepted [facility] .  Plan  Plan was to transfer to [facility] today, but does not have transport. Will go tomorrow.     Rolinda Climes, DO 04/22/23 1247    Rolinda Climes, DO 04/22/23 1355

## 2023-04-22 NOTE — ED Notes (Signed)
 Patient Refusing to participate in orthostatic vital signs. Refusing when asked by both NT Ashlyn and this RN.

## 2023-04-22 NOTE — ED Notes (Signed)
 Pt taken to CT at this time.

## 2023-04-22 NOTE — ED Notes (Signed)
 Pt back from CT

## 2023-04-22 NOTE — ED Notes (Signed)
 Pt refused orthostatic vital signs. RN made aware.

## 2023-04-22 NOTE — ED Notes (Signed)
 Per officer Tymer, patient cannot be transported today, it will be tomorrow.  Contacted Ava, at [facility] and advised her of the transportation delay, and she said she will change his arrival date to tomorrow.  Linder Revere, MD and Chyrel Craw, NP notified.

## 2023-04-22 NOTE — ED Notes (Signed)
 MD Rancour notified of patient refusing orthostatic vital signs, when asked why the patient stated, "I'm tired." LR bolus ordered (SEE MAR)

## 2023-04-23 LAB — GASTROINTESTINAL PANEL BY PCR, STOOL (REPLACES STOOL CULTURE)

## 2023-04-23 NOTE — ED Notes (Signed)
Psych NP at BS 

## 2023-04-23 NOTE — ED Notes (Signed)
 New/ updated info:  [facility] [address] Deniece Fire Report to: RN Sup: [phone]

## 2023-04-23 NOTE — ED Notes (Addendum)
 Pt verbalizes agreeability with plan. Provided phone to contact his surgical office [facility] to confirm surgery schedule and plan timeframe. Confirmed 4/28 surgery.

## 2023-04-23 NOTE — ED Provider Notes (Addendum)
 Emergency Medicine Observation Re-evaluation Note  Dylan [name] is a 58 y.o. male, seen on rounds today.  Pt initially presented to the ED for complaints of Suicidal Currently, the patient is resting.  Physical Exam  BP 103/69 (BP Location: Left Arm)   Pulse 65   Temp 98.1 F (36.7 C) (Oral)   Resp 16   SpO2 98%  Physical Exam General: resting   ED Course / MDM  EKG:EKG Interpretation Date/Time:  Sunday April 22 2023 00:19:59 EDT Ventricular Rate:  93 PR Interval:  149 QRS Duration:  98 QT Interval:  369 QTC Calculation: 459 R Axis:   70  Text Interpretation: Sinus tachycardia Multiple ventricular premature complexes Probable left atrial enlargement Borderline repolarization abnormality No significant change was found Confirmed by Earma Gloss [phone]) on 04/22/2023 12:34:18 AM  I have reviewed the labs performed to date as well as medications administered while in observation.  Recent changes in the last 24 hours include nothin.  Plan  Current plan is for plan if admit to [facility]. Aaron Aas  Updated the AM that bed should be available today.    Lowery Rue, DO 04/23/23 [id number]    Lowery Rue, DO 04/23/23 4696

## 2023-04-23 NOTE — ED Notes (Signed)
 Per Elvin Hammer Note on 4/12 1702:  4/12/20254 5:02 PM   @1700 : Per [facility] Intake Coordinator, Shade, the patient has been accepted for admission today, 04/21/2023. A bed is currently available. The patient is accepted to [facility] and should report to the 100 Unit upon arrival. The accepting provider is Dr. Arvil Lauber.  The patient's care team has provided disposition updates.  For nurse-to-nurse report, please call: [phone].  Will confirm current status, [facility]/ SD transport pending

## 2023-04-23 NOTE — ED Notes (Addendum)
 States, "not going to [place]". Pt calm, but irritable. Plan, process, timeframe, IVC, transfer to [facility] for psych eval and tx discussed with rationale. Pt verbalizes frustration with distance, and concern for rectal CA surgery schedule/ plans. Wants to speak with EDP. SD informed ED will plan to transport prior to 12p. States, "he is going to fight".

## 2023-04-23 NOTE — ED Notes (Signed)
 Pt bantering with/ talking with/ joking with SD, preparing to leave. Pt agreeable with plan, and remains irritable. Am meds given.

## 2023-04-23 NOTE — ED Notes (Signed)
 Up to shower, NAD, calm, cooperative at this time. Sitter present.

## 2023-04-23 NOTE — ED Notes (Signed)
 SD here for transport. PT calm, and cooperative, VSS. Belonging, including: meds, phone, wallet and clothes given to SD, bagged and labeled.

## 2023-04-25 ENCOUNTER — Ambulatory Visit: Payer: MEDICAID | Admitting: Family Medicine

## 2023-05-07 ENCOUNTER — Other Ambulatory Visit: Payer: Self-pay | Admitting: Infectious Diseases

## 2023-05-07 ENCOUNTER — Ambulatory Visit: Payer: Self-pay | Admitting: General Surgery

## 2023-05-07 ENCOUNTER — Other Ambulatory Visit (HOSPITAL_COMMUNITY): Payer: Self-pay

## 2023-05-07 ENCOUNTER — Encounter: Payer: Self-pay | Admitting: Infectious Diseases

## 2023-05-07 NOTE — H&P (Signed)
 REFERRING PHYSICIAN:  [name], MD  PROVIDER:  [name], MD  MRN: [id number] DOB: 05-Jun-1965 DATE OF ENCOUNTER: 05/07/2023  Subjective   Chief Complaint: New Consultation     History of Present Illness: Dylan [name] is a 58 y.o. male who is seen today as an office consultation at the request of Dr. [name] for evaluation of New Consultation .  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  He does complain of a small growth on the left side of his anus.   Review of Systems: A complete review of systems was obtained from the patient.  I have reviewed this information and discussed as appropriate with the patient.  See HPI as well for other ROS.    Medical History: Past Medical History:  Diagnosis Date   Anxiety    Arthritis    GERD (gastroesophageal reflux disease)    HIV -AIDS with opportunistic infection, Symptomatic (CMS/HHS-HCC)    Liver disease     There is no problem list on file for this patient.   Past Surgical History:  Procedure Laterality Date   JOINT REPLACEMENT       No Known Allergies  Current Outpatient Medications on File Prior to Visit  Medication Sig Dispense Refill   BIKTARVY  50-200-25 mg tablet Take 1 tablet by mouth once daily     gabapentin  (NEURONTIN ) 100 MG capsule Take 100 mg by mouth 3 (three) times daily     No current facility-administered medications on file prior to visit.    Family History  Problem Relation Age of Onset   Diabetes Father    Breast cancer Sister      Social History   Tobacco Use  Smoking Status Every Day   Types: Cigarettes  Smokeless Tobacco Never     Social History   Socioeconomic History   Marital status: Married  Tobacco Use   Smoking status: Every Day    Types: Cigarettes   Smokeless tobacco: Never  Vaping Use   Vaping status: Never Used  Substance and Sexual Activity   Alcohol use: Yes   Drug use:  Yes    Types: Other-see comments    Comment: cocain   Social Drivers of Health   Food Insecurity: No Food Insecurity (03/10/2023)   Received from [facility]   Hunger Vital Sign    Worried About Running Out of Food in the Last Year: Never true    Ran Out of Food in the Last Year: Never true  Transportation Needs: Unmet Transportation Needs (03/10/2023)   Received from [facility] - [facility]    Lack of Transportation (Medical): Yes    Lack of Transportation (Non-Medical): Yes  Housing Stability: Low Risk  (03/10/2023)   Received from [facility] Stability Vital Sign    Unable to Pay for Housing in the Last Year: No    Number of Times Moved in the Last Year: 0    Homeless in the Last Year: No    Objective:    Vitals:   05/07/23 1456  BP: 118/72  Pulse: 98  Temp: 36.2 C (97.1 F)  SpO2: 99%  Weight: 99 kg (218 lb 3.2 oz)  Height: 188 cm (6\' 2" )     Exam Gen: NAD Abd: soft   Labs, Imaging and Diagnostic Testing: HIV 48 CD4 749  Assessment and Plan:  Diagnoses and all orders for  this visit:  LGSIL Pap smear of anus    I have recommended proceeding with high-resolution anoscopy with possible biopsy or ablation.  We discussed this in detail.  If the lesion on his anal canal, is amenable to ablation or resection we will remove that at the same time.  [name], MD Colon and Rectal Surgery [facility]

## 2023-05-14 ENCOUNTER — Emergency Department (HOSPITAL_COMMUNITY)
Admission: EM | Admit: 2023-05-14 | Discharge: 2023-05-15 | Disposition: A | Discharge status: Home / Self Care | Payer: MEDICAID | Attending: Emergency Medicine | Admitting: Emergency Medicine

## 2023-05-14 DIAGNOSIS — F1490 Cocaine use, unspecified, uncomplicated: Secondary | ICD-10-CM

## 2023-05-14 DIAGNOSIS — Z91040 Latex allergy status: Secondary | ICD-10-CM | POA: Insufficient documentation

## 2023-05-14 DIAGNOSIS — F1994 Other psychoactive substance use, unspecified with psychoactive substance-induced mood disorder: Secondary | ICD-10-CM | POA: Diagnosis not present

## 2023-05-14 DIAGNOSIS — R45851 Suicidal ideations: Secondary | ICD-10-CM | POA: Diagnosis not present

## 2023-05-14 DIAGNOSIS — R0789 Other chest pain: Secondary | ICD-10-CM

## 2023-05-15 ENCOUNTER — Emergency Department (HOSPITAL_COMMUNITY): Payer: MEDICAID

## 2023-05-15 ENCOUNTER — Other Ambulatory Visit: Payer: Self-pay

## 2023-05-15 ENCOUNTER — Encounter (HOSPITAL_COMMUNITY): Payer: Self-pay

## 2023-05-15 DIAGNOSIS — F1994 Other psychoactive substance use, unspecified with psychoactive substance-induced mood disorder: Secondary | ICD-10-CM

## 2023-05-15 LAB — CBC WITH DIFFERENTIAL/PLATELET
Abs Immature Granulocytes: 0.02 10*3/uL (ref 0.00–0.07)
Basophils Absolute: 0 10*3/uL (ref 0.0–0.1)
Basophils Relative: 0 %
Eosinophils Absolute: 0.2 10*3/uL (ref 0.0–0.5)
Eosinophils Relative: 3 %
HCT: 41.2 % (ref 39.0–52.0)
Hemoglobin: 13.5 g/dL (ref 13.0–17.0)
Immature Granulocytes: 0 %
Lymphocytes Relative: 28 %
Lymphs Abs: 2.1 10*3/uL (ref 0.7–4.0)
MCH: 30.3 pg (ref 26.0–34.0)
MCHC: 32.8 g/dL (ref 30.0–36.0)
MCV: 92.6 fL (ref 80.0–100.0)
Monocytes Absolute: 0.5 10*3/uL (ref 0.1–1.0)
Monocytes Relative: 6 %
Neutro Abs: 4.9 10*3/uL (ref 1.7–7.7)
Neutrophils Relative %: 63 %
Platelets: 235 10*3/uL (ref 150–400)
RBC: 4.45 MIL/uL (ref 4.22–5.81)
RDW: 12.8 % (ref 11.5–15.5)
WBC: 7.6 10*3/uL (ref 4.0–10.5)
nRBC: 0 % (ref 0.0–0.2)

## 2023-05-15 LAB — ACETAMINOPHEN LEVEL: Acetaminophen (Tylenol), Serum: <10 ug/mL — ABNORMAL LOW (ref 10–30)

## 2023-05-15 LAB — TROPONIN I (HIGH SENSITIVITY)
Troponin I (High Sensitivity): 4 ng/L (ref <18)
Troponin I (High Sensitivity): 3 ng/L (ref <18)

## 2023-05-15 LAB — COMPREHENSIVE METABOLIC PANEL WITH GFR
ALT: 30 U/L (ref 0–44)
AST: 37 U/L (ref 15–41)
Albumin: 3.7 g/dL (ref 3.5–5.0)
Alkaline Phosphatase: 76 U/L (ref 38–126)
Anion gap: 7 (ref 5–15)
BUN: 19 mg/dL (ref 6–20)
CO2: 25 mmol/L (ref 22–32)
Calcium: 9.1 mg/dL (ref 8.9–10.3)
Chloride: 103 mmol/L (ref 98–111)
Creatinine, Ser: 0.99 mg/dL (ref 0.61–1.24)
GFR, Estimated: >60 mL/min (ref >60)
Glucose, Bld: 92 mg/dL (ref 70–99)
Potassium: 4.8 mmol/L (ref 3.5–5.1)
Sodium: 135 mmol/L (ref 135–145)
Total Bilirubin: 0.9 mg/dL (ref 0.0–1.2)
Total Protein: 7 g/dL (ref 6.5–8.1)

## 2023-05-15 LAB — ETHANOL: Alcohol, Ethyl (B): <15 mg/dL (ref <15)

## 2023-05-15 LAB — SALICYLATE LEVEL: Salicylate Lvl: <7 mg/dL — ABNORMAL LOW (ref 7.0–30.0)

## 2023-05-15 LAB — CK: Total CK: 735 U/L — ABNORMAL HIGH (ref 49–397)

## 2023-05-15 NOTE — ED Provider Notes (Signed)
 [place] EMERGENCY DEPARTMENT AT [facility] Provider Note   CSN: [id number] Arrival date & time: 05/14/23  2358     History  Chief Complaint  Patient presents with   Chest Pain   Suicidal    Handsome Grissett is a 58 y.o. male.  The history is provided by the patient and medical records.  Chest Pain  58 year old male presenting to the ED with chest pain.  States he smoked crack cocaine today about 1 hour prior to arrival and afterwards developed chest pain.  States he felt like pressure but seems to be dissipating now.  He is not normally use, has been clean for several years.  He reports he has been depressed today was offered some cocaine which he used.  States his 29-year-old daughter passed away 9 months ago and he has been having a lot of difficulty processing this.  He has been very depressed.  He states he has had thoughts of suicide, potentially cutting himself but has not acted on this.  He denies any HI.  No AVH.  He denies any other substance abuse aside from crack cocaine.  He has no prior cardiac history.  He does vape but does not smoke cigarettes.  Patient's only other physical complaint is some aching pain in his legs.  Was concerned he may have rhabdomyolysis again.  He has had this previously, not related to traumatic setting or drug use.  Home Medications Prior to Admission medications   Medication Sig Start Date End Date Taking? Authorizing Provider  bictegravir-emtricitabine -tenofovir  AF (BIKTARVY ) 50-200-25 MG TABS tablet Take 1 tablet by mouth daily. 04/21/23   Felicie Horning, PA-C  ciclopirox  (PENLAC ) 8 % solution Apply topically at bedtime. Apply over nail and surrounding skin. Apply daily over previous coat. After seven (7) days, may remove with alcohol and continue cycle. Patient not taking: Reported on 04/22/2023 03/20/23   Wagoner, Matthew R, DPM  gabapentin  (NEURONTIN ) 100 MG capsule Take 1 capsule (100 mg total) by mouth 3 (three) times  daily. Patient not taking: Reported on 04/22/2023 03/14/23   Robet Chiquito, NP  gabapentin  (NEURONTIN ) 300 MG capsule Take 300 mg by mouth 3 (three) times daily.    [provider]  Ibuprofen  200 MG CAPS Take 200-800 mg by mouth every 6 (six) hours as needed (for pain or headaches).    [provider]  ketoconazole  (NIZORAL ) 2 % cream Apply 1 Application topically daily. 03/20/23   Charity Conch, DPM  meloxicam  (MOBIC ) 7.5 MG tablet Take 7.5 mg by mouth daily as needed for pain.    [provider]  mirtazapine  (REMERON ) 7.5 MG tablet Take 1 tablet (7.5 mg total) by mouth at bedtime. Patient not taking: Reported on 04/22/2023 03/14/23   Robet Chiquito, NP  nicotine  (NICODERM CQ  - DOSED IN MG/24 HOURS) 14 mg/24hr patch Place 1 patch (14 mg total) onto the skin daily. Patient not taking: Reported on 04/22/2023 03/15/23   Robet Chiquito, NP  Nicotine  (NICODERM CQ  TD) Place 1 patch onto the skin daily as needed (for smoking cessation).    [provider]  pantoprazole  (PROTONIX ) 40 MG tablet Take 1 tablet (40 mg total) by mouth daily. Patient taking differently: Take 40 mg by mouth daily as needed (for reflux). 03/15/23   Robet Chiquito, NP  rosuvastatin  (CRESTOR ) 10 MG tablet Take 10 mg by mouth at bedtime.    [provider]  tiZANidine  (ZANAFLEX ) 4 MG tablet Take 4 mg by mouth every 8 (eight)  hours as needed for muscle spasms.    [provider]  traZODone  (DESYREL ) 50 MG tablet Take 1 tablet (50 mg total) by mouth at bedtime as needed for sleep. Patient taking differently: Take 150 mg by mouth at bedtime as needed for sleep. 03/14/23   Robet Chiquito, NP      Allergies    Penicillins, Latex, and Tape    Review of Systems   Review of Systems  Cardiovascular:  Positive for chest pain.  All other systems reviewed and are negative.   Physical Exam Updated Vital Signs BP 129/79 (BP Location: Right Arm)   Pulse 86   Temp 98.8 F (37.1 C) (Oral)    Resp 17   Wt 103.9 kg   SpO2 100%   BMI 29.40 kg/m   Physical Exam Vitals and nursing note reviewed.  Constitutional:      Appearance: He is well-developed.  HENT:     Head: Normocephalic and atraumatic.  Eyes:     General: No scleral icterus.       Right eye: No discharge.     Conjunctiva/sclera: Conjunctivae normal.     Pupils: Pupils are equal, round, and reactive to light.  Cardiovascular:     Rate and Rhythm: Normal rate and regular rhythm.     Heart sounds: Normal heart sounds.  Pulmonary:     Effort: Pulmonary effort is normal.     Breath sounds: Normal breath sounds.  Abdominal:     General: Bowel sounds are normal.     Palpations: Abdomen is soft.  Musculoskeletal:        General: Normal range of motion.     Cervical back: Normal range of motion.     Comments: Legs without noted swelling, asymmetry, or tenderness on exam, DP pulses intact bilaterally  Skin:    General: Skin is warm and dry.  Neurological:     Mental Status: He is alert and oriented to person, place, and time.  Psychiatric:     Comments: Seems upset when speaking about daughters death, SI without specific plan given Denies HI/AVH     ED Results / Procedures / Treatments   Labs (all labs ordered are listed, but only abnormal results are displayed) Labs Reviewed  SALICYLATE LEVEL - Abnormal; Notable for the following components:      Result Value   Salicylate Lvl <7.0 (*)    All other components within normal limits  ACETAMINOPHEN  LEVEL - Abnormal; Notable for the following components:   Acetaminophen  (Tylenol ), Serum <10 (*)    All other components within normal limits  CK - Abnormal; Notable for the following components:   Total CK 735 (*)    All other components within normal limits  COMPREHENSIVE METABOLIC PANEL WITH GFR  ETHANOL  CBC WITH DIFFERENTIAL/PLATELET  RAPID URINE DRUG SCREEN, HOSP PERFORMED  TROPONIN I (HIGH SENSITIVITY)  TROPONIN I (HIGH SENSITIVITY)     EKG None  Radiology DG Chest 2 View Result Date: 05/15/2023 CLINICAL DATA:  Chest pain following crack use EXAM: CHEST - 2 VIEW COMPARISON:  12/16/2022 FINDINGS: The heart size and mediastinal contours are within normal limits. Both lungs are clear. The visualized skeletal structures are unremarkable. IMPRESSION: No active cardiopulmonary disease. Electronically Signed   By: Violeta Grey M.D.   On: 05/15/2023 01:01    Procedures Procedures    Medications Ordered in ED Medications - No data to display  ED Course/ Medical Decision Making/ A&P  Medical Decision Making Amount and/or Complexity of Data Reviewed Labs: ordered. Radiology: ordered and independent interpretation performed. ECG/medicine tests: ordered and independent interpretation performed.   58 year old male presenting to the ED with chest pain.  Began about an hour ago after smoking crack cocaine.  He has been clean from drugs for the past several years but relapsed recently due to some depression regarding his daughter's death.  Has had some suicidal thoughts without concrete plan.  He is afebrile and nontoxic in appearance here.  He is hemodynamically stable on room air.  EKG is nonischemic.  Labs are grossly reassuring, troponin negative x 2.  Patient did have some leg cramping and has history of rhabdo, CK was repeated and is <1000.  He has been hydrating orally.   Medically clear.  Will get TTS evaluation.  Final Clinical Impression(s) / ED Diagnoses Final diagnoses:  Suicidal ideation  Other chest pain  Crack cocaine use    Rx / DC Orders ED Discharge Orders     None         [name], PA-C 05/15/23 0555    Kelsey Patricia, MD 05/15/23 [phone]

## 2023-05-15 NOTE — ED Provider Notes (Signed)
  Physical Exam  BP 129/79 (BP Location: Right Arm)   Pulse 86   Temp 98.8 F (37.1 C) (Oral)   Resp 17   Wt 103.9 kg   SpO2 100%   BMI 29.40 kg/m   Physical Exam  Procedures  Procedures  ED Course / MDM    Medical Decision Making Amount and/or Complexity of Data Reviewed Labs: ordered. Radiology: ordered.   Patient is been seen by psychiatry and cleared for discharge.       Mozell Arias, MD 05/15/23 1121

## 2023-05-15 NOTE — Consult Note (Signed)
 [facility] Psychiatric Consult Initial  Patient Name: .Dylan [name]  MRN: [id number]  DOB: 11-18-1965  Consult Order details:  Orders (From admission, onward)     Start     Ordered   05/15/23 0434  CONSULT TO CALL ACT TEAM       Ordering Provider: [name], PA-C  Provider:  (Not yet assigned)  Question:  Reason for Consult?  Answer:  Psych consult   05/15/23 0433             Mode of Visit: In person    Psychiatry Consult Evaluation  Service Date: May 15, 2023 LOS:  LOS: 0 days  Chief Complaint substance abuse, depression  Primary Psychiatric Diagnoses  Substance induced mood disorder 2.  grief 3.    Assessment  Dylan [name] is a 58 y.o. male admitted: Presented to the EDfor 05/14/2023 11:58 PM for passive SI and other medical concerns. He carries the psychiatric diagnoses of MDD, bipolar, and substance abuse.  His current presentation of depressed mood is most consistent with substance induced mood disorder. Current outpatient psychotropic medications include remeron  and trazodone  and historically he has had a positive response to these medications. He was not compliant with medications prior to admission as evidenced by patient report. Pt also not compliant with any follow up care. On initial examination, patient endorses chronic passive SI, and grief. Please see plan below for detailed recommendations.   Diagnoses:  Active Hospital problems: Principal Problem:   Substance induced mood disorder (HCC)    Plan   ## Psychiatric Medication Recommendations:  - Continue home medications, no changes  ## Medical Decision Making Capacity: Pt has capacity   ## Disposition:-- There are no psychiatric contraindications to discharge at this time  ## Behavioral / Environmental: - No specific recommendations at this time.     ## Safety and Observation Level:  - Based on my clinical evaluation, I estimate the patient to be at low risk of self harm in the current  setting. - At this time, we recommend  routine. This decision is based on my review of the chart including patient's history and current presentation, interview of the patient, mental status examination, and consideration of suicide risk including evaluating suicidal ideation, plan, intent, suicidal or self-harm behaviors, risk factors, and protective factors. This judgment is based on our ability to directly address suicide risk, implement suicide prevention strategies, and develop a safety plan while the patient is in the clinical setting. Please contact our team if there is a concern that risk level has changed.  CSSR Risk Category:C-SSRS RISK CATEGORY: High Risk  Suicide Risk Assessment: Patient has following modifiable risk factors for suicide: social isolation and medication noncompliance, which we are addressing by encouraging treatment compliance. Patient has following non-modifiable or demographic risk factors for suicide: male gender, history of self harm behavior, and psychiatric hospitalization Patient has the following protective factors against suicide: Access to outpatient mental health care, Supportive family, Supportive friends, and Cultural, spiritual, or religious beliefs that discourage suicide  Thank you for this consult request. Recommendations have been communicated to the primary team.  We will psych clear for discharge at this time.   Roise Cleaver, NP       History of Present Illness  Relevant Aspects of Hospital ED Course:    58 year old male presenting to the ED with chest pain.  States he smoked crack cocaine today about 1 hour prior to arrival and afterwards developed chest pain.  States he felt  like pressure but seems to be dissipating now.  He is not normally use, has been clean for several years.  He reports he has been depressed today was offered some cocaine which he used.  States his 56-year-old daughter passed away 9 months ago and he has been having a lot of  difficulty processing this.  He has been very depressed.  He states he has had thoughts of suicide, potentially cutting himself but has not acted on this.  He denies any HI.  No AVH.  He denies any other substance abuse aside from crack cocaine.  He has no prior cardiac history.  He does vape but does not smoke cigarettes.  Patient Report:   Dylan [name], 58 y.o., male patient seen face to face by this provider, consulted with Dr. Deborah Falling; and chart reviewed on 05/15/23.  On evaluation Dylan [name] reports that since his discharge from [facility] around two weeks ago he has not been compliant with his medications, and started feeling depressed with passive SI after smoking crack yesterday. Pt stated he had not smoked crack in many years, and does regret doing this. He does continue to endorse alcohol use, almost daily use. Reports several beers and sometimes liquor when he drinks. Pt denies current SI, but states he was having passive SI while thinking about his daughter that passed away around a year ago. Pt has presented to ED/UC several times with SI related to grief of daughter passing and substance abuse, but has yet to follow up with outpatient treatment plan and recommendations, and has continued substance abuse. He denies HI. Denies AVH. Pt stated he has a place to stay in [place], states he is currently unemployed. Pt stated he wants substance abuse treatment. Informed him the [facility] was unable to accommodate today, however can provide him with numerous resources for substance abuse treatment. We also spoke about other community resources he could utilize such as the [facility] if he is in crisis again in future or seeking substance abuse treatment. Pt understanding and agreeable with plan.  During evaluation Dylan [name] is laying on his bed in no acute distress.  He is alert, oriented x 4, calm, cooperative and attentive.  His mood is euthymic with congruent affect.  He has normal speech, and behavior.   Objectively there is no evidence of psychosis/mania or delusional thinking.  Patient is able to converse coherently, goal directed thoughts, no distractibility, or pre-occupation.  He also denies homicidal ideation, psychosis, and paranoia.  Patient answered question appropriately.  Patient would benefit from outpatient psychiatric services which resources will be provided along with community resources prior to discharge.    Patient has not benefited from past hospitalization under similar circumstances in terms of suicide risk modification, improvement in mental health, or social conditions.  Patients repeated use of ED and psychiatric hospitalization services instead of recommended outpatient follow-up is ineffective in helping patients' improvement.  Psychiatric hospitalization is not indicated, and the patient's refusal of interventions that have been offered by clinical care teams during prior stays in the ED, UC and during psychiatric hospitalization to mitigate risk of self-harm, other than allowing care team to observe to sobriety or behavior.  This provider and Dr. Deborah Falling have discussed assessment and treatment recommendations with patient.  Further we see no evidence of severe psychosis, cognitive impairment, intoxication, or other condition that prevents the patient from acting under their own choice and volition.      Will request ED provides bus pass for patient to  present to [facility] for walk in assessment for residential substance abuse.   Review of Systems  Psychiatric/Behavioral:  Positive for depression and substance abuse.   All other systems reviewed and are negative.    Psychiatric and Social History  Psychiatric History:  Prev Dx/Sx: polysubstance abuse, bipolar disorder, PTSD and GAD Current Psych Provider: none  Home Meds (current): seroquel , trazodone  Previous Med Trials: many Therapy: denies   Prior Psych Hospitalization: yes, many  Prior Self Harm: multiple previous  attempts - OD, held gun to head, plan to jump off parking deck Prior Violence: denies   Family Psych History: Mom - mental issues, doesn't know dx; Uncle - schizophrenia Family Hx suicide: denies   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 Hx: unknown Living Situation: Lives with a room mate Spiritual Hx: none noted Access to weapons/lethal means: denies    Substance History Alcohol: yes Tobacco: denies all Illicit drugs: crack Prescription drug abuse: denies  Rehab hx: yes  Exam Findings  Physical Exam:  Vital Signs:  Temp:  [98.8 F (37.1 C)] 98.8 F (37.1 C) (05/06 0002) Pulse Rate:  [86] 86 (05/06 0002) Resp:  [17] 17 (05/06 0002) BP: (129)/(79) 129/79 (05/06 0002) SpO2:  [100 %] 100 % (05/06 0002) Weight:  [103.9 kg] 103.9 kg (05/06 0003) Blood pressure 129/79, pulse 86, temperature 98.8 F (37.1 C), temperature source Oral, resp. rate 17, weight 103.9 kg, SpO2 100%. Body mass index is 29.4 kg/m.  Physical Exam Vitals and nursing note reviewed.  Neurological:     Mental Status: He is alert and oriented to person, place, and time.     Mental Status Exam: General Appearance: Fairly Groomed  Orientation:  Full (Time, Place, and Person)  Memory:  Immediate;   Good Recent;   Good  Concentration:  Concentration: Good  Recall:  Good  Attention  Good  Eye Contact:  Good  Speech:  Clear and Coherent  Language:  Good  Volume:  Normal  Mood: "okay I guess"  Affect:  Appropriate  Thought Process:  Coherent and Goal Directed  Thought Content:  WDL  Suicidal Thoughts:   passive SI yesterday, denies today  Homicidal Thoughts:  No  Judgement:  Fair  Insight:  Fair  Psychomotor Activity:  Normal  Akathisia:  No  Fund of Knowledge:  Good      Assets:  Communication Skills Housing Intimacy Leisure Time Physical  Health  Cognition:  WNL  ADL's:  Intact  AIMS (if indicated):        Other History   These have been pulled in through the EMR, reviewed, and updated if appropriate.  Family History:  The patient's family history includes CAD in his mother; Colon cancer (age of onset: 37) in his maternal aunt; Diabetes in his father and maternal aunt; Heart attack in his maternal uncle; Heart disease in his maternal uncle; Stroke in his father.  Medical History: 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   Intentional drug overdose (HCC) 11/26/2021   Neuromuscular disorder (HCC)  neuropathy   Neuropathy    Pre-diabetes    Rhabdomyolysis 02/19/2017   Unilateral primary osteoarthritis, left knee 12/14/2021    Surgical History: Past Surgical History:  Procedure Laterality Date   FOOT ARTHRODESIS Right 09/17/2019   Procedure: FUSION RIGHT LISFRANC JOINT;  Surgeon: Timothy Ford, MD;  Location: [facility] OR;  Service: Orthopedics;  Laterality: Right;   FOOT ARTHRODESIS Right 09/2019   HEMORROIDECTOMY     IR FLUORO GUIDE CV LINE RIGHT  02/22/2017   IR US  GUIDE VASC ACCESS RIGHT  02/22/2017   TOTAL KNEE ARTHROPLASTY Left 12/14/2021   Procedure: LEFT TOTAL KNEE ARTHROPLASTY;  Surgeon: Timothy Ford, MD;  Location: [facility] OR;  Service: Orthopedics;  Laterality: Left;   TUMOR REMOVAL     From Chest     Medications:  No current facility-administered medications for this encounter.  Current Outpatient Medications:    bictegravir-emtricitabine -tenofovir  AF (BIKTARVY ) 50-200-25 MG TABS tablet, Take 1 tablet by mouth daily., Disp: 30 tablet, Rfl: 0   ciclopirox  (PENLAC ) 8 % solution, Apply topically at bedtime. Apply over nail and surrounding  skin. Apply daily over previous coat. After seven (7) days, may remove with alcohol and continue cycle., Disp: 6.6 mL, Rfl: 2   gabapentin  (NEURONTIN ) 300 MG capsule, Take 600 mg by mouth 3 (three) times daily., Disp: , Rfl:    Ibuprofen  200 MG CAPS, Take 200 mg by mouth daily as needed (for pain or headaches)., Disp: , Rfl:    ketoconazole  (NIZORAL ) 2 % cream, Apply 1 Application topically daily. (Patient taking differently: Apply 1 Application topically daily as needed for irritation.), Disp: 60 g, Rfl: 2   nicotine  (NICODERM CQ  - DOSED IN MG/24 HOURS) 14 mg/24hr patch, Place 1 patch (14 mg total) onto the skin daily., Disp: 28 patch, Rfl: 0   pantoprazole  (PROTONIX ) 40 MG tablet, Take 1 tablet (40 mg total) by mouth daily., Disp: 30 tablet, Rfl: 0   rosuvastatin  (CRESTOR ) 10 MG tablet, Take 10 mg by mouth at bedtime., Disp: , Rfl:    tiZANidine  (ZANAFLEX ) 4 MG tablet, Take 4 mg by mouth daily as needed for muscle spasms., Disp: , Rfl:    traZODone  (DESYREL ) 50 MG tablet, Take 1 tablet (50 mg total) by mouth at bedtime as needed for sleep., Disp: 30 tablet, Rfl: 0   mirtazapine  (REMERON ) 7.5 MG tablet, Take 1 tablet (7.5 mg total) by mouth at bedtime. (Patient not taking: Reported on 04/22/2023), Disp: 30 tablet, Rfl: 0  Allergies: Allergies  Allergen Reactions   Penicillins Other (See Comments)    Reaction type/severity unknown, childhood allergy   Latex Rash   Tape Rash    Roise Cleaver, NP

## 2023-05-15 NOTE — ED Triage Notes (Signed)
 Pt BIB EMS with c/o CP after using crack and SI. Pt took 324mg  aspirin . Pt denies SOB or a plan of SI.    18G FA 132/84 HR 84

## 2023-05-15 NOTE — ED Notes (Signed)
Pt's belongings placed in locker #3.  

## 2023-05-15 NOTE — Discharge Instructions (Signed)

## 2023-05-21 ENCOUNTER — Encounter: Payer: Self-pay | Admitting: *Deleted

## 2023-05-21 NOTE — Congregational Nurse Program (Signed)
  Dept: [phone]   Congregational Nurse Program Note  Date of Encounter: 05/21/2023  Past Medical History: 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   Intentional drug overdose (HCC) 11/26/2021   Neuromuscular disorder (HCC)    neuropathy   Neuropathy    Pre-diabetes    Rhabdomyolysis 02/19/2017   Unilateral primary osteoarthritis, left knee 12/14/2021    Encounter Details:  Community Questionnaire - 05/21/23 1230       Questionnaire   Ask client: Do you give verbal consent for me to treat you today? Yes    Student Assistance N/A    Location Patient Served  [facility]    Encounter Setting CN site    Population Status Unhoused    Insurance Unknown    Insurance/Financial Assistance Referral N/A    Medication N/A    Medical Provider Yes    Screening Referrals Made N/A    Medical Referrals Made ED    Medical Appointment Completed N/A    CNP Interventions Advocate/Support    Screenings CN Performed Blood Pressure    ED Visit Averted N/A    Life-Saving Intervention Made N/A           Writer alerted non resident of [facility] was outside backdoor c/o chest pain. He rates pain as a 10 on 0-10 scale with 10 being the worst. Pain is in mid chest area. EMS called and Clinical research associate took vitals.Blood pressure 132/83, pulse 97, SpO2 98%. Dylan [name] reports he is a patient of Dr Adan Holms and has a cardiologist in [place] he saw three months ago. He has upcoming surgery scheduled for rectal CA. Offered support and assistance until EMS arrived. He is requesting to go to [facility] where his cardiologist is located.  Dylan Lininger W RN CN

## 2023-06-02 ENCOUNTER — Emergency Department (HOSPITAL_COMMUNITY)
Admission: EM | Admit: 2023-06-02 | Discharge: 2023-06-02 | Discharge status: Against Medical Advice | Payer: MEDICAID | Attending: Emergency Medicine | Admitting: Emergency Medicine

## 2023-06-02 DIAGNOSIS — R110 Nausea: Secondary | ICD-10-CM | POA: Diagnosis not present

## 2023-06-02 DIAGNOSIS — Z5321 Procedure and treatment not carried out due to patient leaving prior to being seen by health care provider: Secondary | ICD-10-CM | POA: Diagnosis not present

## 2023-06-02 DIAGNOSIS — K922 Gastrointestinal hemorrhage, unspecified: Secondary | ICD-10-CM | POA: Insufficient documentation

## 2023-06-02 DIAGNOSIS — Z85048 Personal history of other malignant neoplasm of rectum, rectosigmoid junction, and anus: Secondary | ICD-10-CM | POA: Insufficient documentation

## 2023-06-02 NOTE — ED Notes (Signed)
 Patient was directed to the restroom, patient was seen ambulating to the lobby.

## 2023-06-02 NOTE — ED Triage Notes (Signed)
 Patient coming form home after experiencing several blood red stools over the course of 20 minutes. Patient was recently diagnosed with colorectal cancer. Patient had abdominal pain and nausea but has resolved. EMS VS 141/89 BP 87 HR 99% RA

## 2023-06-04 ENCOUNTER — Other Ambulatory Visit: Payer: Self-pay

## 2023-06-04 ENCOUNTER — Emergency Department (HOSPITAL_COMMUNITY): Payer: MEDICAID

## 2023-06-04 ENCOUNTER — Inpatient Hospital Stay (HOSPITAL_COMMUNITY)
Admission: EM | Admit: 2023-06-04 | Discharge: 2023-06-06 | DRG: 558 | Disposition: A | Discharge status: Home / Self Care | Payer: MEDICAID | Attending: Internal Medicine | Admitting: Internal Medicine

## 2023-06-04 ENCOUNTER — Encounter (HOSPITAL_COMMUNITY): Payer: Self-pay | Admitting: Hospitalist

## 2023-06-04 DIAGNOSIS — R7303 Prediabetes: Secondary | ICD-10-CM | POA: Diagnosis present

## 2023-06-04 DIAGNOSIS — Z79899 Other long term (current) drug therapy: Secondary | ICD-10-CM

## 2023-06-04 DIAGNOSIS — M1990 Unspecified osteoarthritis, unspecified site: Secondary | ICD-10-CM | POA: Diagnosis present

## 2023-06-04 DIAGNOSIS — G629 Polyneuropathy, unspecified: Secondary | ICD-10-CM | POA: Diagnosis present

## 2023-06-04 DIAGNOSIS — F419 Anxiety disorder, unspecified: Secondary | ICD-10-CM | POA: Diagnosis present

## 2023-06-04 DIAGNOSIS — Z8619 Personal history of other infectious and parasitic diseases: Secondary | ICD-10-CM

## 2023-06-04 DIAGNOSIS — F319 Bipolar disorder, unspecified: Secondary | ICD-10-CM | POA: Diagnosis present

## 2023-06-04 DIAGNOSIS — Z96652 Presence of left artificial knee joint: Secondary | ICD-10-CM | POA: Diagnosis present

## 2023-06-04 DIAGNOSIS — Z800 Family history of malignant neoplasm of digestive organs: Secondary | ICD-10-CM

## 2023-06-04 DIAGNOSIS — Z87891 Personal history of nicotine dependence: Secondary | ICD-10-CM

## 2023-06-04 DIAGNOSIS — Z9109 Other allergy status, other than to drugs and biological substances: Secondary | ICD-10-CM

## 2023-06-04 DIAGNOSIS — B20 Human immunodeficiency virus [HIV] disease: Secondary | ICD-10-CM | POA: Diagnosis present

## 2023-06-04 DIAGNOSIS — Z833 Family history of diabetes mellitus: Secondary | ICD-10-CM

## 2023-06-04 DIAGNOSIS — I10 Essential (primary) hypertension: Secondary | ICD-10-CM | POA: Diagnosis present

## 2023-06-04 DIAGNOSIS — Z860100 Personal history of colon polyps, unspecified: Secondary | ICD-10-CM

## 2023-06-04 DIAGNOSIS — E785 Hyperlipidemia, unspecified: Secondary | ICD-10-CM | POA: Diagnosis present

## 2023-06-04 DIAGNOSIS — E872 Acidosis, unspecified: Secondary | ICD-10-CM | POA: Diagnosis present

## 2023-06-04 DIAGNOSIS — Z9151 Personal history of suicidal behavior: Secondary | ICD-10-CM

## 2023-06-04 DIAGNOSIS — M6282 Rhabdomyolysis: Principal | ICD-10-CM | POA: Diagnosis present

## 2023-06-04 DIAGNOSIS — Z8249 Family history of ischemic heart disease and other diseases of the circulatory system: Secondary | ICD-10-CM

## 2023-06-04 DIAGNOSIS — K219 Gastro-esophageal reflux disease without esophagitis: Secondary | ICD-10-CM | POA: Diagnosis present

## 2023-06-04 DIAGNOSIS — Z91040 Latex allergy status: Secondary | ICD-10-CM

## 2023-06-04 DIAGNOSIS — Z880 Allergy status to penicillin: Secondary | ICD-10-CM

## 2023-06-04 LAB — CBC WITH DIFFERENTIAL/PLATELET
Abs Immature Granulocytes: 0.02 10*3/uL (ref 0.00–0.07)
Basophils Absolute: 0 10*3/uL (ref 0.0–0.1)
Basophils Relative: 0 %
Eosinophils Absolute: 0.1 10*3/uL (ref 0.0–0.5)
Eosinophils Relative: 1 %
HCT: 43.8 % (ref 39.0–52.0)
Hemoglobin: 14.3 g/dL (ref 13.0–17.0)
Immature Granulocytes: 0 %
Lymphocytes Relative: 22 %
Lymphs Abs: 2.3 10*3/uL (ref 0.7–4.0)
MCH: 30.4 pg (ref 26.0–34.0)
MCHC: 32.6 g/dL (ref 30.0–36.0)
MCV: 93 fL (ref 80.0–100.0)
Monocytes Absolute: 0.8 10*3/uL (ref 0.1–1.0)
Monocytes Relative: 8 %
Neutro Abs: 6.9 10*3/uL (ref 1.7–7.7)
Neutrophils Relative %: 69 %
Platelets: 204 10*3/uL (ref 150–400)
RBC: 4.71 MIL/uL (ref 4.22–5.81)
RDW: 13 % (ref 11.5–15.5)
WBC: 10.1 10*3/uL (ref 4.0–10.5)
nRBC: 0 % (ref 0.0–0.2)

## 2023-06-04 LAB — COMPREHENSIVE METABOLIC PANEL WITH GFR
ALT: 35 U/L (ref 0–44)
AST: 81 U/L — ABNORMAL HIGH (ref 15–41)
Albumin: 4.5 g/dL (ref 3.5–5.0)
Alkaline Phosphatase: 100 U/L (ref 38–126)
Anion gap: 12 (ref 5–15)
BUN: 23 mg/dL — ABNORMAL HIGH (ref 6–20)
CO2: 21 mmol/L — ABNORMAL LOW (ref 22–32)
Calcium: 9.3 mg/dL (ref 8.9–10.3)
Chloride: 99 mmol/L (ref 98–111)
Creatinine, Ser: 1 mg/dL (ref 0.61–1.24)
GFR, Estimated: >60 mL/min (ref >60)
Glucose, Bld: 101 mg/dL — ABNORMAL HIGH (ref 70–99)
Potassium: 3.8 mmol/L (ref 3.5–5.1)
Sodium: 132 mmol/L — ABNORMAL LOW (ref 135–145)
Total Bilirubin: 1.2 mg/dL (ref 0.0–1.2)
Total Protein: 8 g/dL (ref 6.5–8.1)

## 2023-06-04 LAB — CK
Total CK: 6809 U/L — ABNORMAL HIGH (ref 49–397)
Total CK: 5468 U/L — ABNORMAL HIGH (ref 49–397)

## 2023-06-04 LAB — LIPASE, BLOOD: Lipase: 33 U/L (ref 11–51)

## 2023-06-04 MED ORDER — SODIUM CHLORIDE 0.9 % IV SOLN: INTRAVENOUS | Status: DC

## 2023-06-04 MED ORDER — ENOXAPARIN SODIUM 40 MG/0.4ML IJ SOSY
40.0000 mg | PREFILLED_SYRINGE | INTRAMUSCULAR | Status: DC
  Administered 2023-06-04 – 2023-06-05 (×2): 40 mg via SUBCUTANEOUS
  Filled 2023-06-04 (×2): qty 0.4

## 2023-06-04 MED ORDER — SODIUM CHLORIDE 0.9 % IV BOLUS
1000.0000 mL | Freq: Once | INTRAVENOUS | Status: AC
  Administered 2023-06-04: 1000 mL via INTRAVENOUS

## 2023-06-04 MED ORDER — BICTEGRAVIR-EMTRICITAB-TENOFOV 50-200-25 MG PO TABS
1.0000 | ORAL_TABLET | Freq: Every day | ORAL | Status: DC
  Administered 2023-06-04 – 2023-06-06 (×3): 1 via ORAL
  Filled 2023-06-04 (×3): qty 1

## 2023-06-04 MED ORDER — BICTEGRAVIR-EMTRICITAB-TENOFOV 50-200-25 MG PO TABS: 1.0000 | ORAL_TABLET | Freq: Every day | ORAL | Status: DC

## 2023-06-04 MED ORDER — POLYETHYLENE GLYCOL 3350 17 G PO PACK: 17.0000 g | PACK | Freq: Every day | ORAL | Status: DC | PRN

## 2023-06-04 MED ORDER — ACETAMINOPHEN 650 MG RE SUPP: 650.0000 mg | Freq: Four times a day (QID) | RECTAL | Status: DC | PRN

## 2023-06-04 MED ORDER — ACETAMINOPHEN 325 MG PO TABS
650.0000 mg | ORAL_TABLET | Freq: Four times a day (QID) | ORAL | Status: DC | PRN
  Administered 2023-06-04 – 2023-06-05 (×2): 650 mg via ORAL
  Filled 2023-06-04 (×2): qty 2

## 2023-06-04 NOTE — ED Notes (Signed)
 Gave pt ginger ale and crackers, asked if he could use the restroom but stated he did not have to go.

## 2023-06-04 NOTE — Plan of Care (Signed)

## 2023-06-04 NOTE — H&P (Signed)
 History and Physical    Zeth Buday EAV:[id number] DOB: 1965-11-22 DOA: 06/04/2023  DOS: the patient was seen and examined on 06/04/2023  PCP: Joaquin Mulberry, MD   Patient coming from: Home  I have personally briefly reviewed patient's old medical records in [facility]  No notes on file   HPI: 58 year old male W/PMH of HIV on Biktarvy , bipolar disorder, HTN, previous rhabdomyolysis requiring dialysis no longer on dialysis presented to [facility] emergency room complaining of dark cola colored urine started yesterday.  Patient is a safe and in a [employer] in town.  He stated that after his work he felt that his urine was dark.  He had that in the past.  It was not painful.  The urine amount was normal.  Denied any fever chills nausea vomiting abdominal pain chest pain, shortness of breath or testicular pain.  He was concerned about rhabdomyolysis as he had that in the past.  He came into the emergency room for evaluation.  He also has a lesion in his anise and is scheduled to have a biopsy as outpatient. In the ED, he was found to have total CPK level of 6800, normal potassium and normal kidney function.  CT abdomen was negative for kidney stone or other obstructive pathology.  Patient was started on IV fluid hydration and hospitalist service was consulted for evaluation for admission. Patient was seen and examined at bedside in the emergency room.  He denied any abdominal pain, dysuria or fever.  He denied any nausea vomiting.  He complains of some generalized muscle ache and dark-colored urine.  No other symptoms.  He stated that he used to take cholesterol medication but it was stopped when he had rhabdomyolysis last time.  Review of Systems:  ROS  All other systems negative except as noted in the HPI.  Past Medical History:  Diagnosis Date   Anxiety    Arthritis    Bipolar 1 disorder (HCC)    Colon polyps    Depression    GERD (gastroesophageal reflux disease)     Hepatitis B    Human immunodeficiency virus (HIV) (HCC)    Hyperlipidemia    Hypertension    Insomnia due to other mental disorder 02/03/2020   Intentional drug overdose (HCC) 11/26/2021   Neuromuscular disorder (HCC)    neuropathy   Neuropathy    Pre-diabetes    Rhabdomyolysis 02/19/2017   Unilateral primary osteoarthritis, left knee 12/14/2021    Past Surgical History:  Procedure Laterality Date   FOOT ARTHRODESIS Right 09/17/2019   Procedure: FUSION RIGHT LISFRANC JOINT;  Surgeon: Timothy Ford, MD;  Location: [facility] OR;  Service: Orthopedics;  Laterality: Right;   FOOT ARTHRODESIS Right 09/2019   HEMORROIDECTOMY     IR FLUORO GUIDE CV LINE RIGHT  02/22/2017   IR US  GUIDE VASC ACCESS RIGHT  02/22/2017   TOTAL KNEE ARTHROPLASTY Left 12/14/2021   Procedure: LEFT TOTAL KNEE ARTHROPLASTY;  Surgeon: Timothy Ford, MD;  Location: [facility] OR;  Service: Orthopedics;  Laterality: Left;   TUMOR REMOVAL     From Chest     reports that he has quit smoking. His smoking use included e-cigarettes and cigarettes. He has a 12.5 pack-year smoking history. He has never used smokeless tobacco. He reports current alcohol use. He reports that he does not currently use drugs after having used the following drugs: Cocaine and Marijuana.  Allergies  Allergen Reactions   Penicillins Other (See Comments)    Reaction type/severity unknown,  childhood allergy   Latex Rash   Tape Rash    Family History  Problem Relation Age of Onset   CAD Mother    Diabetes Father    Stroke Father    Colon cancer Maternal Aunt 60   Diabetes Maternal Aunt    Heart disease Maternal Uncle    Heart attack Maternal Uncle    Esophageal cancer Neg Hx    Rectal cancer Neg Hx    Stomach cancer Neg Hx     Prior to Admission medications   Medication Sig Start Date End Date Taking? Authorizing Provider  bictegravir-emtricitabine -tenofovir  AF (BIKTARVY ) 50-200-25 MG TABS tablet Take 1 tablet by mouth daily. 04/21/23   Felicie Horning, PA-C  ciclopirox  (PENLAC ) 8 % solution Apply topically at bedtime. Apply over nail and surrounding skin. Apply daily over previous coat. After seven (7) days, may remove with alcohol and continue cycle. 03/20/23   Charity Conch, DPM  gabapentin  (NEURONTIN ) 300 MG capsule Take 600 mg by mouth 3 (three) times daily.    [provider]  Ibuprofen  200 MG CAPS Take 200 mg by mouth daily as needed (for pain or headaches).    [provider]  ketoconazole  (NIZORAL ) 2 % cream Apply 1 Application topically daily. Patient taking differently: Apply 1 Application topically daily as needed for irritation. 03/20/23   Charity Conch, DPM  mirtazapine  (REMERON ) 7.5 MG tablet Take 1 tablet (7.5 mg total) by mouth at bedtime. Patient not taking: Reported on 04/22/2023 03/14/23   Robet Chiquito, NP  nicotine  (NICODERM CQ  - DOSED IN MG/24 HOURS) 14 mg/24hr patch Place 1 patch (14 mg total) onto the skin daily. 03/15/23   Robet Chiquito, NP  pantoprazole  (PROTONIX ) 40 MG tablet Take 1 tablet (40 mg total) by mouth daily. 03/15/23   Robet Chiquito, NP  rosuvastatin  (CRESTOR ) 10 MG tablet Take 10 mg by mouth at bedtime.    [provider]  tiZANidine  (ZANAFLEX ) 4 MG tablet Take 4 mg by mouth daily as needed for muscle spasms.    [provider]  traZODone  (DESYREL ) 50 MG tablet Take 1 tablet (50 mg total) by mouth at bedtime as needed for sleep. 03/14/23   Robet Chiquito, NP    Physical Exam: Vitals:   06/04/23 0533 06/04/23 0823 06/04/23 0840  BP: 128/87 122/82   Pulse: 80 85 85  Resp: 18 16 16   Temp: 97.8 F (36.6 C) 98.1 F (36.7 C) 98.1 F (36.7 C)  TempSrc: Oral  Oral  SpO2: 95% 98%   Weight:   101.2 kg  Height:   6\' 2"  (1.88 m)    Physical Exam   Constitutional: Alert, awake, calm, comfortable HEENT: Neck supple Respiratory: clear to auscultation bilaterally, no wheezing, no crackles. Normal respiratory effort. No accessory muscle use.  Cardiovascular:  Regular rate and rhythm, no murmurs / rubs / gallops. No extremity edema. 2+ pedal pulses. No carotid bruits.  Abdomen: no tenderness, no masses palpated. No hepatosplenomegaly. Bowel sounds positive.  Musculoskeletal: no clubbing / cyanosis. No joint deformity upper and lower extremities. Good ROM, no contractures. Normal muscle tone.  Skin: no rashes, lesions, ulcers. No induration Neurologic: CN 2-12 grossly intact. Sensation intact, DTR normal. Strength 5/5 x all 4 extremities.  Psychiatric: Normal judgment and insight. Alert and oriented x 3. Normal mood.    Labs on Admission: I have personally reviewed following labs and imaging studies  CBC: Recent Labs  Lab 06/04/23 0529  WBC 10.1  NEUTROABS 6.9  HGB 14.3  HCT 43.8  MCV 93.0  PLT 204   Basic Metabolic Panel: Recent Labs  Lab 06/04/23 0529  NA 132*  K 3.8  CL 99  CO2 21*  GLUCOSE 101*  BUN 23*  CREATININE 1.00  CALCIUM  9.3   GFR: Estimated Creatinine Clearance: 103.5 mL/min (by C-G formula based on SCr of 1 mg/dL). Liver Function Tests: Recent Labs  Lab 06/04/23 0529  AST 81*  ALT 35  ALKPHOS 100  BILITOT 1.2  PROT 8.0  ALBUMIN 4.5   Recent Labs  Lab 06/04/23 0529  LIPASE 33   No results for input(s): "AMMONIA" in the last 168 hours. Coagulation Profile: No results for input(s): "INR", "PROTIME" in the last 168 hours. Cardiac Enzymes: Recent Labs  Lab 06/04/23 0529  CKTOTAL 6,809*    Urine analysis:    Component Value Date/Time   COLORURINE YELLOW 11/03/2022 0523   APPEARANCEUR CLEAR 11/03/2022 0523   LABSPEC 1.014 11/03/2022 0523   PHURINE 6.0 11/03/2022 0523   GLUCOSEU NEGATIVE 11/03/2022 0523   HGBUR NEGATIVE 11/03/2022 0523   BILIRUBINUR NEGATIVE 11/03/2022 0523   KETONESUR 20 (A) 11/03/2022 0523   PROTEINUR NEGATIVE 11/03/2022 0523   UROBILINOGEN 0.2 03/06/2020 1149   NITRITE NEGATIVE 11/03/2022 0523   LEUKOCYTESUR NEGATIVE 11/03/2022 0523    Radiological Exams on Admission: I  have personally reviewed images CT Renal Stone Study Result Date: 06/04/2023 CLINICAL DATA:  Flank pain EXAM: CT ABDOMEN AND PELVIS WITHOUT CONTRAST TECHNIQUE: Multidetector CT imaging of the abdomen and pelvis was performed following the standard protocol without IV contrast. RADIATION DOSE REDUCTION: This exam was performed according to the departmental dose-optimization program which includes automated exposure control, adjustment of the mA and/or kV according to patient size and/or use of iterative reconstruction technique. COMPARISON:  CT of the abdomen pelvis performed April 22, 2023 FINDINGS: Lower chest: No acute abnormality. Hepatobiliary: Possible gallstone. No gallbladder wall thickening. No focal liver lesion. Pancreas: Unremarkable. No pancreatic ductal dilatation or surrounding inflammatory changes. Spleen: Normal in size without focal abnormality. Adrenals/Urinary Tract: Adrenal glands are unremarkable. Kidneys are normal, without renal calculi, focal lesion, or hydronephrosis. Bladder is unremarkable. Stomach/Bowel: Stomach is within normal limits. Appendix appears normal. No evidence of bowel wall thickening, distention, or inflammatory changes. Vascular/Lymphatic: No significant vascular findings are present. No enlarged abdominal or pelvic lymph nodes. Reproductive: Prostate is unremarkable. Other: Small fat containing umbilical hernia. Musculoskeletal: Unchanged. IMPRESSION: 1. No significant nephrolithiasis. No evidence of diverticulitis or appendicitis. Electronically Signed   By: Reagan Camera M.D.   On: 06/04/2023 07:29    EKG: My personal interpretation of EKG shows: N/A    Assessment/Plan Active Problems:   Rhabdomyolysis   Essential hypertension   HIV disease (HCC)   Dyslipidemia   Bipolar disorder (HCC)    58 year old male presented with recurrent rhabdomyolysis without any significant symptoms of pain or muscle ache.  1.  Recurrent rhabdomyolysis - He will be placed  in observation. - IV fluid hydration at 125 cc/h normal saline - Will check CPK level tonight and in the morning. - Will continue to check kidney function daily - CT scan showed no stones or obstruction.  2.  HIV disease - Patient takes Biktarvy  - That will be continued  3.  HTN - Blood pressure is stable - Continue to monitor - Resume home medications  4.  Hyperlipidemia - Patient says he has not taken rosuvastatin  - Will keep holding      DVT prophylaxis: Lovenox  Code Status: Full  Code Family Communication: No family member was available, patient is alert awake and oriented x 4. Disposition Plan: Home  Consults called: N/A  Admission status: Observation, Med-Surg   Beatris Bough, MD [facility] 06/04/2023, 9:15 AM

## 2023-06-04 NOTE — ED Triage Notes (Signed)
 Patient brought in by medics.Woke up this morning to use the restroom and notice some dark red urine, rhabdo during summer but has had it multiple times. Currently being treated for colon cancer.no pain reported.

## 2023-06-04 NOTE — ED Provider Notes (Signed)
 [place] EMERGENCY DEPARTMENT AT [facility] Provider Note   CSN: [id number] Arrival date & time: 06/04/23  4098     History  Chief Complaint  Patient presents with   Hematuria    Dylan [name] is a 58 y.o. male.  Patient with a history of HIV, bipolar disorder, hypertension, previous rhabdomyolysis requiring dialysis no longer on dialysis presenting with dark urine.  He states he was at work tonight when he went to urinate he noticed his urine was "dark coffee colored".  This happened twice.  Was not painful.  He is urinating normal amounts.  No fever, abdominal pain, flank pain, back pain, testicular pain.  He is concerned to get rhabdomyolysis again.  He is undergoing workup for anal lesion and scheduled to have a biopsy of this with concern for cancer but does not know if this is cancer yet.  He states he is having intermittent blood with wiping which is unchanged.  No black or bloody stools.  The history is provided by the patient and the EMS personnel.  Hematuria Pertinent negatives include no chest pain, no abdominal pain, no headaches and no shortness of breath.       Home Medications Prior to Admission medications   Medication Sig Start Date End Date Taking? Authorizing Provider  bictegravir-emtricitabine -tenofovir  AF (BIKTARVY ) 50-200-25 MG TABS tablet Take 1 tablet by mouth daily. 04/21/23   Felicie Horning, PA-C  ciclopirox  (PENLAC ) 8 % solution Apply topically at bedtime. Apply over nail and surrounding skin. Apply daily over previous coat. After seven (7) days, may remove with alcohol and continue cycle. 03/20/23   Charity Conch, DPM  gabapentin  (NEURONTIN ) 300 MG capsule Take 600 mg by mouth 3 (three) times daily.    [provider]  Ibuprofen  200 MG CAPS Take 200 mg by mouth daily as needed (for pain or headaches).    [provider]  ketoconazole  (NIZORAL ) 2 % cream Apply 1 Application topically daily. Patient taking differently:  Apply 1 Application topically daily as needed for irritation. 03/20/23   Charity Conch, DPM  mirtazapine  (REMERON ) 7.5 MG tablet Take 1 tablet (7.5 mg total) by mouth at bedtime. Patient not taking: Reported on 04/22/2023 03/14/23   Robet Chiquito, NP  nicotine  (NICODERM CQ  - DOSED IN MG/24 HOURS) 14 mg/24hr patch Place 1 patch (14 mg total) onto the skin daily. 03/15/23   Robet Chiquito, NP  pantoprazole  (PROTONIX ) 40 MG tablet Take 1 tablet (40 mg total) by mouth daily. 03/15/23   Robet Chiquito, NP  rosuvastatin  (CRESTOR ) 10 MG tablet Take 10 mg by mouth at bedtime.    [provider]  tiZANidine  (ZANAFLEX ) 4 MG tablet Take 4 mg by mouth daily as needed for muscle spasms.    [provider]  traZODone  (DESYREL ) 50 MG tablet Take 1 tablet (50 mg total) by mouth at bedtime as needed for sleep. 03/14/23   Robet Chiquito, NP      Allergies    Penicillins, Latex, and Tape    Review of Systems   Review of Systems  Constitutional:  Negative for activity change, appetite change and fever.  HENT:  Negative for congestion and rhinorrhea.   Respiratory:  Negative for cough, chest tightness and shortness of breath.   Cardiovascular:  Negative for chest pain.  Gastrointestinal:  Negative for abdominal pain, nausea and vomiting.  Genitourinary:  Positive for hematuria. Negative for dysuria.  Musculoskeletal:  Negative for arthralgias and myalgias.  Skin:  Negative for rash.  Neurological:  Negative for dizziness, weakness and headaches.   all other systems are negative except as noted in the HPI and PMH.    Physical Exam Updated Vital Signs BP 128/87 (BP Location: Left Arm)   Pulse 80   Temp 97.8 F (36.6 C) (Oral)   Resp 18   SpO2 95%  Physical Exam Vitals and nursing note reviewed.  Constitutional:      General: He is not in acute distress.    Appearance: He is well-developed.  HENT:     Head: Normocephalic and atraumatic.     Mouth/Throat:     Pharynx: No oropharyngeal  exudate.  Eyes:     Conjunctiva/sclera: Conjunctivae normal.     Pupils: Pupils are equal, round, and reactive to light.  Neck:     Comments: No meningismus. Cardiovascular:     Rate and Rhythm: Normal rate and regular rhythm.     Heart sounds: Normal heart sounds. No murmur heard. Pulmonary:     Effort: Pulmonary effort is normal. No respiratory distress.     Breath sounds: Normal breath sounds.  Abdominal:     Palpations: Abdomen is soft.     Tenderness: There is no abdominal tenderness. There is no guarding or rebound.  Musculoskeletal:        General: No tenderness. Normal range of motion.     Cervical back: Normal range of motion and neck supple.  Skin:    General: Skin is warm.  Neurological:     Mental Status: He is alert and oriented to person, place, and time.     Cranial Nerves: No cranial nerve deficit.     Motor: No abnormal muscle tone.     Coordination: Coordination normal.     Comments:  5/5 strength throughout. CN 2-12 intact.Equal grip strength.   Psychiatric:        Behavior: Behavior normal.     ED Results / Procedures / Treatments   Labs (all labs ordered are listed, but only abnormal results are displayed) Labs Reviewed  COMPREHENSIVE METABOLIC PANEL WITH GFR - Abnormal; Notable for the following components:      Result Value   Sodium 132 (*)    CO2 21 (*)    Glucose, Bld 101 (*)    BUN 23 (*)    AST 81 (*)    All other components within normal limits  CK - Abnormal; Notable for the following components:   Total CK 6,809 (*)    All other components within normal limits  CBC WITH DIFFERENTIAL/PLATELET  LIPASE, BLOOD  URINALYSIS, ROUTINE W REFLEX MICROSCOPIC  CBC  CREATININE, SERUM    EKG None  Radiology CT Renal Stone Study Result Date: 06/04/2023 CLINICAL DATA:  Flank pain EXAM: CT ABDOMEN AND PELVIS WITHOUT CONTRAST TECHNIQUE: Multidetector CT imaging of the abdomen and pelvis was performed following the standard protocol without IV  contrast. RADIATION DOSE REDUCTION: This exam was performed according to the departmental dose-optimization program which includes automated exposure control, adjustment of the mA and/or kV according to patient size and/or use of iterative reconstruction technique. COMPARISON:  CT of the abdomen pelvis performed April 22, 2023 FINDINGS: Lower chest: No acute abnormality. Hepatobiliary: Possible gallstone. No gallbladder wall thickening. No focal liver lesion. Pancreas: Unremarkable. No pancreatic ductal dilatation or surrounding inflammatory changes. Spleen: Normal in size without focal abnormality. Adrenals/Urinary Tract: Adrenal glands are unremarkable. Kidneys are normal, without renal calculi, focal lesion, or hydronephrosis. Bladder is unremarkable. Stomach/Bowel: Stomach is within normal limits. Appendix  appears normal. No evidence of bowel wall thickening, distention, or inflammatory changes. Vascular/Lymphatic: No significant vascular findings are present. No enlarged abdominal or pelvic lymph nodes. Reproductive: Prostate is unremarkable. Other: Small fat containing umbilical hernia. Musculoskeletal: Unchanged. IMPRESSION: 1. No significant nephrolithiasis. No evidence of diverticulitis or appendicitis. Electronically Signed   By: Reagan Camera M.D.   On: 06/04/2023 07:29    Procedures Procedures    Medications Ordered in ED Medications  sodium chloride  0.9 % bolus 1,000 mL (has no administration in time range)    ED Course/ Medical Decision Making/ A&P                                 Medical Decision Making Amount and/or Complexity of Data Reviewed Labs: ordered. Decision-making details documented in ED Course. Radiology: ordered and independent interpretation performed. Decision-making details documented in ED Course. ECG/medicine tests: ordered and independent interpretation performed. Decision-making details documented in ED Course.  Risk Decision regarding  hospitalization.   Dark urine since this morning.  No pain.  Concern for rhabdomyolysis which he has had in the past.  Stable vitals.  Abdomen soft without peritoneal signs.  Will give IV hydration.  Labs also normal creatinine and normal potassium.  CK 6800.  CT negative for kidney stone or other obstructive pathology.  Patient denies any pain.  Plan admission for IV hydration given his elevated CK level and previous history of rhabdomyolysis causing renal failure requiring dialysis.  He is agreeable to observation admission.  Discussed with Dr. Paudel.         Final Clinical Impression(s) / ED Diagnoses Final diagnoses:  Non-traumatic rhabdomyolysis    Rx / DC Orders ED Discharge Orders     None         Marilene Vath, Mara [place], MD 06/04/23 [phone]

## 2023-06-05 DIAGNOSIS — B20 Human immunodeficiency virus [HIV] disease: Secondary | ICD-10-CM

## 2023-06-05 DIAGNOSIS — Z8249 Family history of ischemic heart disease and other diseases of the circulatory system: Secondary | ICD-10-CM | POA: Diagnosis not present

## 2023-06-05 DIAGNOSIS — R319 Hematuria, unspecified: Secondary | ICD-10-CM | POA: Diagnosis present

## 2023-06-05 DIAGNOSIS — I10 Essential (primary) hypertension: Secondary | ICD-10-CM | POA: Diagnosis present

## 2023-06-05 DIAGNOSIS — Z9109 Other allergy status, other than to drugs and biological substances: Secondary | ICD-10-CM | POA: Diagnosis not present

## 2023-06-05 DIAGNOSIS — Z8619 Personal history of other infectious and parasitic diseases: Secondary | ICD-10-CM | POA: Diagnosis not present

## 2023-06-05 DIAGNOSIS — Z833 Family history of diabetes mellitus: Secondary | ICD-10-CM | POA: Diagnosis not present

## 2023-06-05 DIAGNOSIS — E872 Acidosis, unspecified: Secondary | ICD-10-CM | POA: Diagnosis present

## 2023-06-05 DIAGNOSIS — Z800 Family history of malignant neoplasm of digestive organs: Secondary | ICD-10-CM | POA: Diagnosis not present

## 2023-06-05 DIAGNOSIS — Z87891 Personal history of nicotine dependence: Secondary | ICD-10-CM | POA: Diagnosis not present

## 2023-06-05 DIAGNOSIS — F419 Anxiety disorder, unspecified: Secondary | ICD-10-CM | POA: Diagnosis present

## 2023-06-05 DIAGNOSIS — Z91040 Latex allergy status: Secondary | ICD-10-CM | POA: Diagnosis not present

## 2023-06-05 DIAGNOSIS — M6282 Rhabdomyolysis: Secondary | ICD-10-CM | POA: Diagnosis present

## 2023-06-05 DIAGNOSIS — Z96652 Presence of left artificial knee joint: Secondary | ICD-10-CM | POA: Diagnosis present

## 2023-06-05 DIAGNOSIS — Z880 Allergy status to penicillin: Secondary | ICD-10-CM | POA: Diagnosis not present

## 2023-06-05 DIAGNOSIS — M1990 Unspecified osteoarthritis, unspecified site: Secondary | ICD-10-CM | POA: Diagnosis present

## 2023-06-05 DIAGNOSIS — E785 Hyperlipidemia, unspecified: Secondary | ICD-10-CM

## 2023-06-05 DIAGNOSIS — G629 Polyneuropathy, unspecified: Secondary | ICD-10-CM | POA: Diagnosis present

## 2023-06-05 DIAGNOSIS — Z79899 Other long term (current) drug therapy: Secondary | ICD-10-CM | POA: Diagnosis not present

## 2023-06-05 DIAGNOSIS — F319 Bipolar disorder, unspecified: Secondary | ICD-10-CM | POA: Diagnosis present

## 2023-06-05 DIAGNOSIS — K219 Gastro-esophageal reflux disease without esophagitis: Secondary | ICD-10-CM | POA: Diagnosis present

## 2023-06-05 DIAGNOSIS — Z860100 Personal history of colon polyps, unspecified: Secondary | ICD-10-CM | POA: Diagnosis not present

## 2023-06-05 DIAGNOSIS — R7303 Prediabetes: Secondary | ICD-10-CM | POA: Diagnosis present

## 2023-06-05 DIAGNOSIS — Z9151 Personal history of suicidal behavior: Secondary | ICD-10-CM | POA: Diagnosis not present

## 2023-06-05 LAB — CBC
HCT: 38.3 % — ABNORMAL LOW (ref 39.0–52.0)
Hemoglobin: 12.3 g/dL — ABNORMAL LOW (ref 13.0–17.0)
MCH: 30.8 pg (ref 26.0–34.0)
MCHC: 32.1 g/dL (ref 30.0–36.0)
MCV: 95.8 fL (ref 80.0–100.0)
Platelets: 150 10*3/uL (ref 150–400)
RBC: 4 MIL/uL — ABNORMAL LOW (ref 4.22–5.81)
RDW: 13.2 % (ref 11.5–15.5)
WBC: 5.9 10*3/uL (ref 4.0–10.5)
nRBC: 0 % (ref 0.0–0.2)

## 2023-06-05 LAB — COMPREHENSIVE METABOLIC PANEL WITH GFR
ALT: 29 U/L (ref 0–44)
AST: 56 U/L — ABNORMAL HIGH (ref 15–41)
Albumin: 2.9 g/dL — ABNORMAL LOW (ref 3.5–5.0)
Alkaline Phosphatase: 74 U/L (ref 38–126)
Anion gap: 7 (ref 5–15)
BUN: 20 mg/dL (ref 6–20)
CO2: 21 mmol/L — ABNORMAL LOW (ref 22–32)
Calcium: 8.4 mg/dL — ABNORMAL LOW (ref 8.9–10.3)
Chloride: 110 mmol/L (ref 98–111)
Creatinine, Ser: 1.02 mg/dL (ref 0.61–1.24)
GFR, Estimated: >60 mL/min (ref >60)
Glucose, Bld: 126 mg/dL — ABNORMAL HIGH (ref 70–99)
Potassium: 3.8 mmol/L (ref 3.5–5.1)
Sodium: 138 mmol/L (ref 135–145)
Total Bilirubin: 0.7 mg/dL (ref 0.0–1.2)
Total Protein: 5.8 g/dL — ABNORMAL LOW (ref 6.5–8.1)

## 2023-06-05 LAB — PROTIME-INR
INR: 1 (ref 0.8–1.2)
Prothrombin Time: 13.3 s (ref 11.4–15.2)

## 2023-06-05 LAB — CK
Total CK: 3740 U/L — ABNORMAL HIGH (ref 49–397)
Total CK: 2962 U/L — ABNORMAL HIGH (ref 49–397)

## 2023-06-05 MED ORDER — SODIUM CHLORIDE 0.9 % IV SOLN: INTRAVENOUS | Status: AC

## 2023-06-05 NOTE — Progress Notes (Signed)
 [facility]                                                                              Dylan [name], is a 58 y.o. male, DOB - 03/16/1965, OZH:[id number] Admit date - 06/04/2023    Outpatient Primary MD for the patient is [name], Enobong, MD  LOS - 0  days  Chief Complaint  Patient presents with   Hematuria       Brief summary   Patient is a 58 year old male with HIV on Biktarvy , bipolar disorder, HTN, previous history of  rhabdomyolysis requiring dialysis, no longer on dialysis presented to ED with dark cola colored urine that started a day before the admission.  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  He stated that after his work he felt that his urine was dark, felt generalized muscle ache similar to the previous episodes of when he had rhabdomyolysis.  He has known history of recurrent rhabdomyolysis of unknown cause, patient thinks it is due to heat in the kitchen and not drinking enough water .  In ED, CPK 6809, renal function stable   Assessment & Plan    Principal Problem:   Rhabdomyolysis with mild elevation in AST, non-anion gap metabolic acidosis -Improving, CK 6809 on admission, improving to 3740 today - Continue aggressive IV fluid hydration - AST improving - Patient counseled to stop statin  Active Problems:   Essential hypertension BP stable    HIV disease (HCC) Continue Biktarvy     Dyslipidemia -Counseled to stop statin, probably can take Zetia or follow-up with cardiology in the lipid clinic     Bipolar disorder (HCC) -Currently not taking any psych medications   Estimated body mass index is 28.65 kg/m as calculated from the following:   Height as of this encounter: 6\' 2"  (1.88 m).   Weight as of this encounter: 101.2 kg.  Code Status: Full CODE STATUS DVT Prophylaxis:  enoxaparin  (LOVENOX ) injection 40 mg Start: 06/04/23 1800 SCDs Start: 06/04/23 0724   Level of Care: Level of care: Med-Surg Family Communication:  Updated patient Disposition Plan:      Remains inpatient appropriate: Hopefully DC home tomorrow   Procedures:    Consultants:     Antimicrobials:   Anti-infectives (From admission, onward)    Start     Dose/Rate Route Frequency Ordered Stop   06/04/23 2000  bictegravir-emtricitabine -tenofovir  AF (BIKTARVY ) 50-200-25 MG per tablet 1 tablet        1 tablet Oral Daily 06/04/23 1745     06/04/23 1845  bictegravir-emtricitabine -tenofovir  AF (BIKTARVY ) 50-200-25 MG per tablet 1 tablet  Status:  Discontinued        1 tablet Oral Daily 06/04/23 1750 06/04/23 1755          Medications  bictegravir-emtricitabine -tenofovir  AF  1 tablet Oral Daily   enoxaparin  (LOVENOX ) injection  40 mg Subcutaneous Q24H      Subjective:   Daejon Lich was seen and examined today.  Feeling better today, urine darker but improving.  Denied any dizziness, chest pain, shortness of breath, abdominal pain, N/V. No acute events overnight.    Objective:   Vitals:  06/04/23 0823 06/04/23 0840 06/04/23 1226 06/04/23 2033  BP: 122/82  129/79 122/71  Pulse: 85 85 83 83  Resp: 16 16 18 16   Temp: 98.1 F (36.7 C) 98.1 F (36.7 C) 98.2 F (36.8 C) 98.6 F (37 C)  TempSrc:  Oral Oral Oral  SpO2: 98%  99% 97%  Weight:  101.2 kg    Height:  6\' 2"  (1.88 m)      Intake/Output Summary (Last 24 hours) at 06/05/2023 1008 Last data filed at 06/04/2023 1900 Gross per 24 hour  Intake 994.12 ml  Output 400 ml  Net 594.12 ml     Wt Readings from Last 3 Encounters:  06/04/23 101.2 kg  05/15/23 103.9 kg  03/10/23 104.3 kg     Exam General: Alert and oriented x 3, NAD Cardiovascular: S1 S2 auscultated,  RRR Respiratory: CTA B Gastrointestinal: Soft, nontender, nondistended, + bowel sounds Ext: no pedal edema bilaterally Neuro: no new deficits Psych: Normal affect     Data Reviewed:  I have personally reviewed following labs    CBC Lab Results  Component Value Date   WBC 5.9 06/05/2023    RBC 4.00 (L) 06/05/2023   HGB 12.3 (L) 06/05/2023   HCT 38.3 (L) 06/05/2023   MCV 95.8 06/05/2023   MCH 30.8 06/05/2023   PLT 150 06/05/2023   MCHC 32.1 06/05/2023   RDW 13.2 06/05/2023   LYMPHSABS 2.3 06/04/2023   MONOABS 0.8 06/04/2023   EOSABS 0.1 06/04/2023   BASOSABS 0.0 06/04/2023     Last metabolic panel Lab Results  Component Value Date   NA 138 06/05/2023   K 3.8 06/05/2023   CL 110 06/05/2023   CO2 21 (L) 06/05/2023   BUN 20 06/05/2023   CREATININE 1.02 06/05/2023   GLUCOSE 126 (H) 06/05/2023   GFRNONAA >60 06/05/2023   GFRAA 92 07/06/2020   CALCIUM  8.4 (L) 06/05/2023   PHOS 5.4 (H) 01/04/2023   PROT 5.8 (L) 06/05/2023   ALBUMIN 2.9 (L) 06/05/2023   LABGLOB 2.9 09/07/2022   AGRATIO 1.7 04/26/2021   BILITOT 0.7 06/05/2023   ALKPHOS 74 06/05/2023   AST 56 (H) 06/05/2023   ALT 29 06/05/2023   ANIONGAP 7 06/05/2023    CBG (last 3)  No results for input(s): "GLUCAP" in the last 72 hours.    Coagulation Profile: Recent Labs  Lab 06/05/23 0401  INR 1.0     Radiology Studies: I have personally reviewed the imaging studies  CT Renal Stone Study Result Date: 06/04/2023 CLINICAL DATA:  Flank pain EXAM: CT ABDOMEN AND PELVIS WITHOUT CONTRAST TECHNIQUE: Multidetector CT imaging of the abdomen and pelvis was performed following the standard protocol without IV contrast. RADIATION DOSE REDUCTION: This exam was performed according to the departmental dose-optimization program which includes automated exposure control, adjustment of the mA and/or kV according to patient size and/or use of iterative reconstruction technique. COMPARISON:  CT of the abdomen pelvis performed April 22, 2023 FINDINGS: Lower chest: No acute abnormality. Hepatobiliary: Possible gallstone. No gallbladder wall thickening. No focal liver lesion. Pancreas: Unremarkable. No pancreatic ductal dilatation or surrounding inflammatory changes. Spleen: Normal in size without focal abnormality.  Adrenals/Urinary Tract: Adrenal glands are unremarkable. Kidneys are normal, without renal calculi, focal lesion, or hydronephrosis. Bladder is unremarkable. Stomach/Bowel: Stomach is within normal limits. Appendix appears normal. No evidence of bowel wall thickening, distention, or inflammatory changes. Vascular/Lymphatic: No significant vascular findings are present. No enlarged abdominal or pelvic lymph nodes. Reproductive: Prostate is unremarkable. Other: Small fat  containing umbilical hernia. Musculoskeletal: Unchanged. IMPRESSION: 1. No significant nephrolithiasis. No evidence of diverticulitis or appendicitis. Electronically Signed   By: Reagan Camera M.D.   On: 06/04/2023 07:29       Rastus Borton M.D. [facility] 06/05/2023, 10:08 AM  Available via Epic secure chat 7am-7pm After 7 pm, please refer to night coverage provider listed on amion.

## 2023-06-05 NOTE — TOC Initial Note (Addendum)
 Transition of Care [facility]) - Initial/Assessment Note    Patient Details  Name: Dylan [name] MRN: [id number] Date of Birth: 1965-12-30  [facility]) CM/SW Contact:    Kathryn Parish, RN Phone Number: 06/05/2023, 3:42 PM  Clinical Narrative:                 CM spoke with patient in room. PTA states was discharged from [facility] last week and requested CM call to speak with Mia (CM) at [facility] ([facility]). Patient states he is living at [facility] in [place], [place]. [place] spoke with Gerre Kraft [facility] [phone]) states patient can return. However, will need to provide own transportation. Patient states that [employer]([phone]) will provide transportation at discharge. PCP/insurance verified.TOC is following.  Expected Discharge Plan: IP Rehab Facility Barriers to Discharge: Continued Medical Work up   Patient Goals and CMS Choice Patient states their goals for this hospitalization and ongoing recovery are:: return to [facility]   Choice offered to / list presented to : NA      Expected Discharge Plan and Services In-house Referral: NA Discharge Planning Services: CM Consult Post Acute Care Choice: NA Living arrangements for the past 2 months:  (Rehab)                 DME Arranged: N/A DME Agency: NA       HH Arranged: NA HH Agency: NA        Prior Living Arrangements/Services Living arrangements for the past 2 months:  (Rehab) Lives with:: Significant Other Patient language and need for interpreter reviewed:: Yes Do you feel safe going back to the place where you live?: Yes      Need for Family Participation in Patient Care: No (Comment) Care giver support system in place?: Yes (comment) Current home services:  (NA) Criminal Activity/Legal Involvement Pertinent to Current Situation/Hospitalization: No - Comment as needed  Activities of Daily Living   ADL Screening (condition at  time of admission) Independently performs ADLs?: Yes (appropriate for developmental age) Is the patient deaf or have difficulty hearing?: No Does the patient have difficulty seeing, even when wearing glasses/contacts?: No Does the patient have difficulty concentrating, remembering, or making decisions?: No  Permission Sought/Granted Permission sought to share information with : Case Manager Permission granted to share information with : Yes, Verbal Permission Granted  Share Information with NAME: Mia [facility]  Permission granted to share info w AGENCY: [employer]     Permission granted to share info w Contact Information: [phone]   [phone]  Emotional Assessment Appearance:: Appears stated age Attitude/Demeanor/Rapport: Gracious, Engaged Affect (typically observed): Appropriate Orientation: : Oriented to Self, Oriented to Place, Oriented to  Time, Oriented to Situation Alcohol / Substance Use: Illicit Drugs Psych Involvement: No (comment)  Admission diagnosis:  Rhabdomyolysis [M62.82] Non-traumatic rhabdomyolysis [M62.82] Patient Active Problem List   Diagnosis Date Noted   MDD (major depressive disorder), recurrent, severe, with psychosis (HCC) 03/12/2023   Transaminitis 01/04/2023   Bipolar 1 disorder, depressed (HCC) 12/05/2022   Non-traumatic rhabdomyolysis 12/04/2022   Weakness 11/03/2022   Cough 11/03/2022   Essential hypertension 11/03/2022   Peripheral neuropathy 11/03/2022   Bipolar disorder (HCC) 08/27/2022   Cannabis use disorder 08/27/2022   Bipolar disorder, most recent episode depressed (HCC) 08/26/2022   Suicidal ideation 08/26/2022   Rhabdomyolysis 07/01/2022   Non-ST elevated myocardial infarction [facility]) 06/03/2022   Dyslipidemia 06/02/2022  Polysubstance abuse (HCC) 06/02/2022   Obesity (BMI 30-39.9) 06/02/2022   Alcohol use disorder 04/12/2022   PTSD (post-traumatic stress disorder) 04/12/2022   S/P  total knee arthroplasty, left 12/14/2021   Total knee replacement status, left 12/14/2021   Chronic fatigue 11/16/2021   Abnormal EKG 11/16/2021   Overweight (BMI 25.0-29.9) 11/16/2021   Elevated LFTs 07/03/2021   Right foot ulcer (HCC) 07/03/2021   Onychomycosis 04/19/2021   Stimulant use disorder 03/16/2021   Nodular radiologic density 02/02/2021   Lesion of liver 02/02/2021   Liver disease 02/02/2021   Vitamin D  deficiency 01/18/2021   Methamphetamine abuse (HCC) 01/17/2021   Lesion of pancreas 01/17/2021   Substance induced mood disorder (HCC) 10/26/2020   Fatty liver 03/09/2020   Generalized anxiety disorder 02/03/2020   Lisfranc dislocation, right, initial encounter    Prediabetes 07/22/2019   Tobacco use disorder 07/10/2018   AKI (acute kidney injury) (HCC)    Chronic 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 coma (HCC)    Acute hepatitis 02/19/2017   Degenerative disc disease 08/21/2011   HIV disease (HCC) 08/17/2011   PCP:  Joaquin Mulberry, MD Pharmacy:   [facility] [address] [place] [address] Phone: [phone] Fax: [phone]     Social Drivers of Health (SDOH) Social History: SDOH Screenings   Food Insecurity: No Food Insecurity (06/04/2023)  Housing: Low Risk  (06/04/2023)  Transportation Needs: No Transportation Needs (06/04/2023)  Recent Concern: Transportation Needs - Unmet Transportation Needs (03/10/2023)  Utilities: Not At Risk (06/04/2023)  Alcohol Screen: Low Risk  (03/10/2023)  Depression (PHQ2-9): Low Risk  (02/14/2023)  Tobacco Use: Medium Risk (06/04/2023)   SDOH Interventions:     Readmission Risk Interventions    06/05/2023    3:36 PM 01/08/2023   12:34 PM  Readmission Risk Prevention Plan  Transportation Screening Complete   Medication Review (RN Care Manager) Complete   PCP or Specialist appointment within 3-5 days of discharge Complete   HRI or Home Care Consult Complete   SW Recovery  Care/Counseling Consult    Palliative Care Screening Not Applicable   Skilled Nursing Facility Not Applicable      Information is confidential and restricted. Go to Review Flowsheets to unlock data.

## 2023-06-05 NOTE — Plan of Care (Signed)

## 2023-06-06 DIAGNOSIS — M6282 Rhabdomyolysis: Secondary | ICD-10-CM | POA: Diagnosis not present

## 2023-06-06 LAB — RENAL FUNCTION PANEL
Albumin: 3.2 g/dL — ABNORMAL LOW (ref 3.5–5.0)
Anion gap: 6 (ref 5–15)
BUN: 15 mg/dL (ref 6–20)
CO2: 23 mmol/L (ref 22–32)
Calcium: 8.6 mg/dL — ABNORMAL LOW (ref 8.9–10.3)
Chloride: 107 mmol/L (ref 98–111)
Creatinine, Ser: 0.86 mg/dL (ref 0.61–1.24)
GFR, Estimated: >60 mL/min (ref >60)
Glucose, Bld: 102 mg/dL — ABNORMAL HIGH (ref 70–99)
Phosphorus: 3.1 mg/dL (ref 2.5–4.6)
Potassium: 4 mmol/L (ref 3.5–5.1)
Sodium: 136 mmol/L (ref 135–145)

## 2023-06-06 LAB — CBC
HCT: 41.7 % (ref 39.0–52.0)
Hemoglobin: 13.4 g/dL (ref 13.0–17.0)
MCH: 30.7 pg (ref 26.0–34.0)
MCHC: 32.1 g/dL (ref 30.0–36.0)
MCV: 95.4 fL (ref 80.0–100.0)
Platelets: 159 10*3/uL (ref 150–400)
RBC: 4.37 MIL/uL (ref 4.22–5.81)
RDW: 13 % (ref 11.5–15.5)
WBC: 6 10*3/uL (ref 4.0–10.5)
nRBC: 0 % (ref 0.0–0.2)

## 2023-06-06 LAB — CK: Total CK: 2060 U/L — ABNORMAL HIGH (ref 49–397)

## 2023-06-06 NOTE — Progress Notes (Signed)
 RN read and reviewed discharge instructions. Pt verbalized understanding for follow-up appointments. Pt escorted to main entrance to [place] transportation to [employer] in [place], [place] .

## 2023-06-06 NOTE — Discharge Summary (Signed)
 Physician Discharge Summary   Patient: Dylan [name] MRN: [id number] DOB: 03/04/1965  Admit date:     06/04/2023  Discharge date: 06/06/23  Discharge Physician: Cherylle Corwin   PCP: Joaquin Mulberry, MD   Recommendations at discharge:    Follow up with PCP in 1-2 weeks Recommend rechecking BMET and CK levels in one week  Discharge Diagnoses: Principal Problem:   Rhabdomyolysis Active Problems:   Essential hypertension   HIV disease (HCC)   Dyslipidemia   Bipolar disorder (HCC)  Resolved Problems:   * No resolved hospital problems. *  Hospital Course: 58 year old male with HIV on Biktarvy , bipolar disorder, HTN, previous history of  rhabdomyolysis requiring dialysis, no longer on dialysis presented to ED with dark cola colored urine that started a day before the admission.  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  He stated that after his work he felt that his urine was dark, felt generalized muscle ache similar to the previous episodes of when he had rhabdomyolysis.  He has known history of recurrent rhabdomyolysis of unknown cause, patient thinks it is due to heat in the kitchen and not drinking enough water .  In ED, CPK 6809, renal function stable  Assessment and Plan: Principal Problem:   Rhabdomyolysis with mild elevation in AST, non-anion gap metabolic acidosis -Improving, CK 6809 on admission, improving to 2,060 by day of d/c - Improved with aggressive IV fluid hydration - Recommend to continue holding off on statin - Recommend recheck BMET and CK in 1 week as outpatient   Active Problems:   Essential hypertension BP stable     HIV disease (HCC) Continue Biktarvy      Dyslipidemia -Counseled to stop statin -Follow up as outpatient       Bipolar disorder (HCC) -Currently not taking any psych medications    Consultants:  Procedures performed:   Disposition: Home Diet recommendation:  Regular diet DISCHARGE MEDICATION: Allergies as of 06/06/2023        Reactions   Penicillins Other (See Comments)   Reaction type/severity unknown, childhood allergy   Latex Rash   Tape Rash        Medication List     STOP taking these medications    ciclopirox  8 % solution Commonly known as: PENLAC    gabapentin  300 MG capsule Commonly known as: NEURONTIN    ketoconazole  2 % cream Commonly known as: NIZORAL    mirtazapine  7.5 MG tablet Commonly known as: REMERON    NICODERM CQ  TD   nicotine  14 mg/24hr patch Commonly known as: NICODERM CQ  - dosed in mg/24 hours   pantoprazole  40 MG tablet Commonly known as: PROTONIX    rosuvastatin  10 MG tablet Commonly known as: CRESTOR    traZODone  50 MG tablet Commonly known as: DESYREL        TAKE these medications    Aleve  220 MG tablet Generic drug: naproxen  sodium Take 220-440 mg by mouth 2 (two) times daily as needed (for pain).   Biktarvy  50-200-25 MG Tabs tablet Generic drug: bictegravir-emtricitabine -tenofovir  AF Take 1 tablet by mouth daily.        Follow-up Information     Joaquin Mulberry, MD Follow up in 1 week(s).   Specialty: Family Medicine Why: Hospital follow up Contact information: [address] [place] [address] [place] [address] [phone]                Discharge Exam: Cleavon Curls Weights   06/04/23 0840  Weight: 101.2 kg   General exam: Awake, laying in bed, in nad Respiratory  system: Normal respiratory effort, no wheezing Cardiovascular system: regular rate, s1, s2 Gastrointestinal system: Soft, nondistended, positive BS Central nervous system: CN2-12 grossly intact, strength intact Extremities: Perfused, no clubbing Skin: Normal skin turgor, no notable skin lesions seen Psychiatry: Mood normal // no visual hallucinations   Condition at discharge: fair  The results of significant diagnostics from this hospitalization (including imaging, microbiology, ancillary and laboratory) are listed below for reference.   Imaging Studies: CT Renal Stone  Study Result Date: 06/04/2023 CLINICAL DATA:  Flank pain EXAM: CT ABDOMEN AND PELVIS WITHOUT CONTRAST TECHNIQUE: Multidetector CT imaging of the abdomen and pelvis was performed following the standard protocol without IV contrast. RADIATION DOSE REDUCTION: This exam was performed according to the departmental dose-optimization program which includes automated exposure control, adjustment of the mA and/or kV according to patient size and/or use of iterative reconstruction technique. COMPARISON:  CT of the abdomen pelvis performed April 22, 2023 FINDINGS: Lower chest: No acute abnormality. Hepatobiliary: Possible gallstone. No gallbladder wall thickening. No focal liver lesion. Pancreas: Unremarkable. No pancreatic ductal dilatation or surrounding inflammatory changes. Spleen: Normal in size without focal abnormality. Adrenals/Urinary Tract: Adrenal glands are unremarkable. Kidneys are normal, without renal calculi, focal lesion, or hydronephrosis. Bladder is unremarkable. Stomach/Bowel: Stomach is within normal limits. Appendix appears normal. No evidence of bowel wall thickening, distention, or inflammatory changes. Vascular/Lymphatic: No significant vascular findings are present. No enlarged abdominal or pelvic lymph nodes. Reproductive: Prostate is unremarkable. Other: Small fat containing umbilical hernia. Musculoskeletal: Unchanged. IMPRESSION: 1. No significant nephrolithiasis. No evidence of diverticulitis or appendicitis. Electronically Signed   By: Reagan Camera M.D.   On: 06/04/2023 07:29   DG Chest 2 View Result Date: 05/15/2023 CLINICAL DATA:  Chest pain following crack use EXAM: CHEST - 2 VIEW COMPARISON:  12/16/2022 FINDINGS: The heart size and mediastinal contours are within normal limits. Both lungs are clear. The visualized skeletal structures are unremarkable. IMPRESSION: No active cardiopulmonary disease. Electronically Signed   By: Violeta Grey M.D.   On: 05/15/2023 01:01     Microbiology: Results for orders placed or performed during the hospital encounter of 04/21/23  C Difficile Quick Screen w PCR reflex     Status: None   Collection Time: 04/22/23 12:40 AM   Specimen: STOOL  Result Value Ref Range Status   C Diff antigen NEGATIVE NEGATIVE Final   C Diff toxin NEGATIVE NEGATIVE Final   C Diff interpretation No C. difficile detected.  Final    Comment: Performed at [facility], [address], [place], [place] [address]  Gastrointestinal Panel by PCR , Stool     Status: None   Collection Time: 04/22/23 12:40 AM   Specimen: STOOL  Result Value Ref Range Status   Campylobacter species NOT DETECTED NOT DETECTED Final   Plesimonas shigelloides NOT DETECTED NOT DETECTED Final   Salmonella species NOT DETECTED NOT DETECTED Final   Yersinia enterocolitica NOT DETECTED NOT DETECTED Final   Vibrio species NOT DETECTED NOT DETECTED Final   Vibrio cholerae NOT DETECTED NOT DETECTED Final   Enteroaggregative E coli (EAEC) NOT DETECTED NOT DETECTED Final   Enteropathogenic E coli (EPEC) NOT DETECTED NOT DETECTED Final   Enterotoxigenic E coli (ETEC) NOT DETECTED NOT DETECTED Final   Shiga like toxin producing E coli (STEC) NOT DETECTED NOT DETECTED Final   Shigella/Enteroinvasive E coli (EIEC) NOT DETECTED NOT DETECTED Final   Cryptosporidium NOT DETECTED NOT DETECTED Final   Cyclospora cayetanensis NOT DETECTED NOT DETECTED Final  Entamoeba histolytica NOT DETECTED NOT DETECTED Final   Giardia lamblia NOT DETECTED NOT DETECTED Final   Adenovirus F40/41 NOT DETECTED NOT DETECTED Final   Astrovirus NOT DETECTED NOT DETECTED Final   Norovirus GI/GII NOT DETECTED NOT DETECTED Final   Rotavirus A NOT DETECTED NOT DETECTED Final   Sapovirus (I, II, IV, and V) NOT DETECTED NOT DETECTED Final    Comment: Performed at [facility], [address] [place], [place] [address]    Labs: CBC: Recent Labs  Lab 06/04/23 0529 06/05/23 0401  06/06/23 0436  WBC 10.1 5.9 6.0  NEUTROABS 6.9  --   --   HGB 14.3 12.3* 13.4  HCT 43.8 38.3* 41.7  MCV 93.0 95.8 95.4  PLT 204 150 159   Basic Metabolic Panel: Recent Labs  Lab 06/04/23 0529 06/05/23 0401 06/06/23 0436  NA 132* 138 136  K 3.8 3.8 4.0  CL 99 110 107  CO2 21* 21* 23  GLUCOSE 101* 126* 102*  BUN 23* 20 15  CREATININE 1.00 1.02 0.86  CALCIUM  9.3 8.4* 8.6*  PHOS  --   --  3.1   Liver Function Tests: Recent Labs  Lab 06/04/23 0529 06/05/23 0401 06/06/23 0436  AST 81* 56*  --   ALT 35 29  --   ALKPHOS 100 74  --   BILITOT 1.2 0.7  --   PROT 8.0 5.8*  --   ALBUMIN 4.5 2.9* 3.2*   CBG: No results for input(s): "GLUCAP" in the last 168 hours.  Discharge time spent: less than 30 minutes.  Signed: Cherylle Corwin, MD [facility] 06/06/2023

## 2023-06-06 NOTE — TOC Transition Note (Signed)
 Transition of Care [facility]) - Discharge Note   Patient Details  Name: Dylan [name] MRN: [id number] Date of Birth: 01/29/1965  Transition of Care [facility]) CM/SW Contact:  Kathryn Parish, RN Phone Number: 06/06/2023, 11:20 AM   Clinical Narrative:     CM called [employer] to set up transportation to [facility] in [place]. TOC signing off  Final next level of care: Other (comment) [facility] Substance Abuse) Barriers to Discharge: Barriers Resolved   Patient Goals and CMS Choice Patient states their goals for this hospitalization and ongoing recovery are:: return to [facility]   Choice offered to / list presented to : NA      Discharge Placement                       Discharge Plan and Services Additional resources added to the After Visit Summary for   In-house Referral: NA Discharge Planning Services: CM Consult Post Acute Care Choice: NA          DME Arranged: N/A DME Agency: NA       HH Arranged: NA HH Agency: NA        Social Drivers of Health (SDOH) Interventions SDOH Screenings   Food Insecurity: No Food Insecurity (06/04/2023)  Housing: Low Risk  (06/04/2023)  Transportation Needs: No Transportation Needs (06/04/2023)  Recent Concern: Transportation Needs - Unmet Transportation Needs (03/10/2023)  Utilities: Not At Risk (06/04/2023)  Alcohol Screen: Low Risk  (03/10/2023)  Depression (PHQ2-9): Low Risk  (02/14/2023)  Tobacco Use: Medium Risk (06/04/2023)     Readmission Risk Interventions    06/05/2023    3:36 PM 01/08/2023   12:34 PM  Readmission Risk Prevention Plan  Transportation Screening Complete   Medication Review (RN Care Manager) Complete   PCP or Specialist appointment within 3-5 days of discharge Complete   HRI or Home Care Consult Complete   SW Recovery Care/Counseling Consult    Palliative Care Screening Not Applicable   Skilled Nursing Facility Not Applicable      Information is  confidential and restricted. Go to Review Flowsheets to unlock data.

## 2023-06-07 ENCOUNTER — Telehealth: Payer: Self-pay

## 2023-06-07 NOTE — Transitions of Care (Post Inpatient/ED Visit) (Signed)
   06/07/2023  Name: Gurinder Toral MRN: [id number] DOB: 08-29-1965  Today's TOC FU Call Status: Today's TOC FU Call Status:: Unsuccessful Call (1st Attempt) Unsuccessful Call (1st Attempt) Date: 06/07/23  Attempted to reach the patient regarding the most recent Inpatient/ED visit.  Follow Up Plan: Additional outreach attempts will be made to reach the patient to complete the Transitions of Care (Post Inpatient/ED visit) call.   Signature  Burnett Carson, RN

## 2023-06-08 ENCOUNTER — Telehealth: Payer: Self-pay | Admitting: Family Medicine

## 2023-06-08 NOTE — Progress Notes (Signed)
 The ASCVD Risk score (Arnett DK, et al., 2019) failed to calculate for the following reasons:   Risk score cannot be calculated because patient has a medical history suggesting prior/existing ASCVD  Arlon Bergamo, BSN, RN

## 2023-06-08 NOTE — Telephone Encounter (Signed)
 Copied from CRM [id number]. Topic: Medical Record Request - Provider/Facility Request >> Jun 08, 2023  9:45 AM Felicitas Horse wrote:  Reason for CRM: Jenette Mitchell called in to see about Medical clearance for pt that has a surgery coming up on 06/06.   Jenette Mitchell would like to know if pt will need an appt to be able to be cleared for surgery, or if paperwork can be filled out and sent in to move forward with surgery for pt.   Pt was last seen by Dr. Newlin 09/07/2022   Callback number [phone] - secured Line

## 2023-06-08 NOTE — Telephone Encounter (Signed)
 Surgery office was called and informed that patient will need appointment before he can be cleared for surgery. I informed her that first available appointment is in July. I will reach out to patient if earlier appointment becomes available.

## 2023-06-11 ENCOUNTER — Telehealth: Payer: Self-pay

## 2023-06-11 NOTE — Telephone Encounter (Signed)
 Copied from CRM [id number]. Topic: General - Call Back - No Documentation >> Jun 11, 2023  1:27 PM Thliyah D wrote:  Returning missed call

## 2023-06-11 NOTE — Transitions of Care (Post Inpatient/ED Visit) (Signed)
   06/11/2023  Name: Dylan [name] MRN: [id number] DOB: November 21, 1965  Today's TOC FU Call Status: Today's TOC FU Call Status:: Unsuccessful Call (2nd Attempt) Unsuccessful Call (1st Attempt) Date: 06/07/23 Unsuccessful Call (2nd Attempt) Date: 06/11/23  Attempted to reach the patient regarding the most recent Inpatient/ED visit.  Follow Up Plan: Additional outreach attempts will be made to reach the patient to complete the Transitions of Care (Post Inpatient/ED visit) call.   Signature  Burnett Carson, RN

## 2023-06-12 ENCOUNTER — Telehealth: Payer: Self-pay

## 2023-06-12 NOTE — Telephone Encounter (Signed)
 Call returned to patient today and the call is documented in a TOC call today

## 2023-06-12 NOTE — Transitions of Care (Post Inpatient/ED Visit) (Signed)
   06/12/2023  Name: Dylan [name] MRN: [id number] DOB: 1965-07-28  Today's TOC FU Call Status: Today's TOC FU Call Status:: Unsuccessful Call (3rd Attempt) Unsuccessful Call (1st Attempt) Date: 06/07/23 Unsuccessful Call (2nd Attempt) Date: 06/11/23 Unsuccessful Call (3rd Attempt) Date: 06/12/23  Attempted to reach the patient regarding the most recent Inpatient/ED visit.  Follow Up Plan: No further outreach attempts will be made at this time. We have been unable to contact the patient.  The patient has an appointment with Dr Adan Holms at [facility] 07/25/2023.   Signature  Burnett Carson, RN

## 2023-06-18 ENCOUNTER — Encounter: Payer: Self-pay | Admitting: Family Medicine

## 2023-06-19 ENCOUNTER — Other Ambulatory Visit: Payer: Self-pay | Admitting: Family Medicine

## 2023-06-19 MED ORDER — DICLOFENAC SODIUM 1 % EX GEL: 4.0000 g | Freq: Four times a day (QID) | CUTANEOUS | 1 refills | Status: AC

## 2023-06-19 MED ORDER — GABAPENTIN 300 MG PO CAPS: 300.0000 mg | ORAL_CAPSULE | Freq: Three times a day (TID) | ORAL | 1 refills | Status: DC

## 2023-06-25 ENCOUNTER — Encounter: Payer: Self-pay | Admitting: Family Medicine

## 2023-06-25 ENCOUNTER — Ambulatory Visit: Payer: MEDICAID | Attending: Family Medicine | Admitting: Family Medicine

## 2023-06-25 VITALS — BP 138/87 | HR 62 | Ht 74.0 in | Wt 238.6 lb

## 2023-06-25 DIAGNOSIS — Z01818 Encounter for other preprocedural examination: Secondary | ICD-10-CM

## 2023-06-25 DIAGNOSIS — Z125 Encounter for screening for malignant neoplasm of prostate: Secondary | ICD-10-CM

## 2023-06-25 DIAGNOSIS — R7303 Prediabetes: Secondary | ICD-10-CM

## 2023-06-25 DIAGNOSIS — M19071 Primary osteoarthritis, right ankle and foot: Secondary | ICD-10-CM

## 2023-06-25 DIAGNOSIS — M6282 Rhabdomyolysis: Secondary | ICD-10-CM

## 2023-06-25 DIAGNOSIS — E782 Mixed hyperlipidemia: Secondary | ICD-10-CM | POA: Diagnosis not present

## 2023-06-25 LAB — POCT GLYCOSYLATED HEMOGLOBIN (HGB A1C): HbA1c, POC (prediabetic range): 5.7 % (ref 5.7–6.4)

## 2023-06-25 MED ORDER — ROSUVASTATIN CALCIUM 20 MG PO TABS
20.0000 mg | ORAL_TABLET | Freq: Every day | ORAL | 3 refills | Status: DC
Start: 2023-06-25 — End: 2023-07-23

## 2023-06-25 MED ORDER — MELOXICAM 7.5 MG PO TABS
7.5000 mg | ORAL_TABLET | Freq: Every day | ORAL | 1 refills | Status: DC
Start: 2023-06-25 — End: 2023-07-12

## 2023-06-25 NOTE — Patient Instructions (Signed)
 VISIT SUMMARY:  Today, you came in for a preoperative clearance and discussed several health concerns, including your recurrent rhabdomyolysis, elevated cholesterol, prediabetes, arthritis in your right foot, and the upcoming endoscopy. We also talked about general health maintenance, including prostate cancer screening.  YOUR PLAN:  -RECURRENT RHABDOMYOLYSIS: Rhabdomyolysis is a condition where muscle tissue breaks down, releasing a protein called myoglobin into the blood, which can damage the kidneys. Your episodes may be related to working in high heat environments. We will check your CK levels and write a letter to your employer to request accommodations to avoid heat exposure.  -HYPERLIPIDEMIA: Hyperlipidemia means you have high levels of cholesterol in your blood. You will restart taking rosuvastatin  at 20 mg daily to manage your cholesterol levels.  -PRE-DIABETES: Pre-diabetes means your blood sugar levels are higher than normal but not high enough to be classified as diabetes. Your A1c has improved to 5.7 from 5.9. We will continue to monitor your blood sugar levels with a fingerstick test and recheck your A1c in 6 months. Keep up with your lifestyle modifications.  -ARTHRITIS IN RIGHT FOOT: Arthritis is inflammation of the joints, which can cause pain and swelling. You will start taking meloxicam  once daily as needed for pain relief. Do not use Aleve  and meloxicam  at the same time.  -PREOPERATIVE CLEARANCE FOR ENDOSCOPY: You need clearance for an upcoming endoscopy due to an abnormal finding in your rectum. Your EKG is normal, and there are no cardiac issues. We will complete the preoperative clearance and check your CK and A1c levels.  -GENERAL HEALTH MAINTENANCE: We discussed prostate cancer screening, and you will have a PSA blood test to screen for prostate cancer.  INSTRUCTIONS:  Please follow up for a fingerstick test to monitor your blood sugar levels and recheck your A1c in 6  months. We will also check your CK levels and complete the preoperative clearance for your endoscopy. Additionally, you will have a PSA blood test for prostate cancer screening.

## 2023-06-25 NOTE — Progress Notes (Signed)
 Subjective:  Patient ID: Dylan [name], male    DOB: 04-01-1965  Age: 58 y.o. MRN: [id number]  CC: Pre-op  Exam     Discussed the use of AI scribe software for clinical note transcription with the patient, who gave verbal consent to proceed.  History of Present Illness Dylan [name] is a 58 year old male with a history of HIV (on antiretroviral therapy), hypertension, schizoaffective disorder, substance abuse, Status post left TKA in 12/2021 who presents for preoperative clearance for anoscopy, high resolution.  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  He is requesting a note for work so he can be placed in some cooler areas at his job.  He is concerned about hydration and its role in his condition and tries to drink a lot of fluids but is not sure he is getting in enough.  Last hospitalization was in 05/2023 and prior to that was in 12/2022.  He is not currently on rosuvastatin  for his elevated cholesterol and wishes to resume it as this was discontinued during his hospitalization for rhabdomyolysis. He is scheduled for an anoscopy due to finding of L SIL on the Pap smear of his anus.  He has prediabetes with a recent A1c of 5.7, improved from 5.9, and is working on lifestyle modifications. He experiences bone spurs in his right foot, which swell and become painful, especially in bad weather. He uses Voltaren  cream, Aleve , and gabapentin  for relief. No current muscle aches or cramps, except for occasional cramps and constipation. No leg cramps since hospitalization.    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    Intentional drug overdose (HCC) 11/26/2021   Neuromuscular disorder (HCC)    neuropathy   Neuropathy    Pre-diabetes    Rhabdomyolysis 02/19/2017   Unilateral primary osteoarthritis, left knee 12/14/2021    Past Surgical History:  Procedure Laterality Date   FOOT ARTHRODESIS Right 09/17/2019   Procedure: FUSION RIGHT LISFRANC JOINT;  Surgeon: Timothy Ford, MD;  Location: [facility] OR;  Service: Orthopedics;  Laterality: Right;   FOOT ARTHRODESIS Right 09/2019   HEMORROIDECTOMY     IR FLUORO GUIDE CV LINE RIGHT  02/22/2017   IR US  GUIDE VASC ACCESS RIGHT  02/22/2017   TOTAL KNEE ARTHROPLASTY Left 12/14/2021   Procedure: LEFT TOTAL KNEE ARTHROPLASTY;  Surgeon: Timothy Ford, MD;  Location: [facility] OR;  Service: Orthopedics;  Laterality: Left;   TUMOR REMOVAL     From Chest    Family History  Problem Relation Age of Onset   CAD Mother    Diabetes Father    Stroke Father    Colon cancer Maternal Aunt 60   Diabetes Maternal Aunt    Heart disease Maternal Uncle    Heart attack Maternal Uncle    Esophageal cancer Neg Hx    Rectal cancer Neg Hx    Stomach cancer Neg Hx     Social History   Socioeconomic History   Marital status: Married    Spouse name: Not on file   Number of  children: 1   Years of education: Not on file   Highest education level: Associate degree: academic program  Occupational History   Not on file  Tobacco Use   Smoking status: Former    Current packs/day: 0.50    Average packs/day: 0.5 packs/day for 25.0 years (12.5 ttl pk-yrs)    Types: E-cigarettes, Cigarettes   Smokeless tobacco: Never   Tobacco comments:    Vapes daily  Vaping Use   Vaping status: Every Day   Start date: 12/09/2020   Substances: Nicotine   Substance and Sexual Activity   Alcohol use: Yes    Comment: last drink March 2024   Drug use: Not Currently    Types: Cocaine, Marijuana    Comment: Last used cocaine March 2024; last used cannabis 2022   Sexual activity: Yes    Partners:  Female  Other Topics Concern   Not on file  Social History Narrative   Lives home alone. 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   Social Drivers of Health   Financial Resource Strain: Medium Risk (06/24/2023)   Overall Financial Resource Strain (CARDIA)    Difficulty of Paying Living Expenses: Somewhat hard  Food Insecurity: Food Insecurity Present (06/24/2023)   Hunger Vital Sign    Worried About Running Out of Food in the Last Year: Sometimes true    Ran Out of Food in the Last Year: Sometimes true  Transportation Needs: Unmet Transportation Needs (06/24/2023)   PRAPARE - Administrator, Civil Service (Medical): Yes    Lack of Transportation (Non-Medical): Yes  Physical Activity: Insufficiently Active (06/24/2023)   Exercise Vital Sign    Days of Exercise per Week: 3 days    Minutes of Exercise per Session: 30 min  Stress: Stress Concern Present (06/24/2023)   [employer] of Occupational Health - Occupational Stress Questionnaire    Feeling of Stress: To some extent  Social Connections: Moderately Integrated (06/24/2023)   Social Connection and Isolation Panel    Frequency of Communication with Friends and Family: Three times a week    Frequency of Social Gatherings with Friends and Family: Three times a week    Attends Religious Services: More than 4 times per year    Active Member of Clubs or Organizations: Yes    Attends Banker Meetings: 1 to 4 times per year    Marital Status: Separated    Allergies  Allergen Reactions   Penicillins Other (See Comments)    Reaction type/severity unknown, childhood allergy   Latex Rash   Tape Rash    Outpatient Medications Prior to Visit  Medication Sig Dispense Refill   bictegravir-emtricitabine -tenofovir  AF (BIKTARVY ) 50-200-25 MG TABS tablet Take 1 tablet by mouth daily. 30 tablet 0   diclofenac  Sodium (VOLTAREN ) 1 % GEL Apply 4 g topically 4 (four) times daily. 100 g 1   gabapentin  (NEURONTIN ) 300 MG  capsule Take 1 capsule (300 mg total) by mouth 3 (three) times daily. 270 capsule 1   ALEVE  220 MG tablet Take 220-440 mg by mouth 2 (two) times daily as needed (for pain).     No facility-administered medications prior to visit.     ROS Review of Systems  Constitutional:  Negative for activity change and appetite change.  HENT:  Negative for sinus pressure and sore throat.   Respiratory:  Negative for chest tightness, shortness of breath and wheezing.   Cardiovascular:  Negative for chest pain and palpitations.  Gastrointestinal:  Negative for abdominal distention, abdominal  pain and constipation.  Genitourinary: Negative.   Musculoskeletal:        See HPI  Psychiatric/Behavioral:  Negative for behavioral problems and dysphoric mood.     Objective:  BP 138/87   Pulse 62   Ht 6' 2 (1.88 m)   Wt 238 lb 9.6 oz (108.2 kg)   SpO2 97%   BMI 30.63 kg/m      06/25/2023   11:10 AM 06/06/2023    5:21 AM 06/05/2023    7:26 PM  BP/Weight  Systolic BP 138 133 133  Diastolic BP 87 80 91  Wt. (Lbs) 238.6    BMI 30.63 kg/m2        Physical Exam Constitutional:      Appearance: He is well-developed.   Cardiovascular:     Rate and Rhythm: Normal rate.     Heart sounds: Normal heart sounds. No murmur heard. Pulmonary:     Effort: Pulmonary effort is normal.     Breath sounds: Normal breath sounds. No wheezing or rales.  Chest:     Chest wall: No tenderness.  Abdominal:     General: Bowel sounds are normal. There is no distension.     Palpations: Abdomen is soft. There is no mass.     Tenderness: There is no abdominal tenderness.   Musculoskeletal:        General: Normal range of motion.     Right lower leg: No edema.     Left lower leg: No edema.   Neurological:     Mental Status: He is alert and oriented to person, place, and time.   Psychiatric:        Mood and Affect: Mood normal.        Latest Ref Rng & Units 06/06/2023    4:36 AM 06/05/2023    4:01 AM  06/04/2023    5:29 AM  CMP  Glucose 70 - 99 mg/dL 409  811  914   BUN 6 - 20 mg/dL 15  20  23    Creatinine 0.61 - 1.24 mg/dL 7.82  9.56  2.13   Sodium 135 - 145 mmol/L 136  138  132   Potassium 3.5 - 5.1 mmol/L 4.0  3.8  3.8   Chloride 98 - 111 mmol/L 107  110  99   CO2 22 - 32 mmol/L 23  21  21    Calcium  8.9 - 10.3 mg/dL 8.6  8.4  9.3   Total Protein 6.5 - 8.1 g/dL  5.8  8.0   Total Bilirubin 0.0 - 1.2 mg/dL  0.7  1.2   Alkaline Phos 38 - 126 U/L  74  100   AST 15 - 41 U/L  56  81   ALT 0 - 44 U/L  29  35     Lipid Panel     Component Value Date/Time   CHOL 261 (H) 02/14/2023 1041   CHOL 158 02/13/2019 1128   TRIG 208 (H) 02/14/2023 1041   HDL 53 02/14/2023 1041   HDL 41 02/13/2019 1128   CHOLHDL 4.9 02/14/2023 1041   VLDL 50 (H) 12/08/2022 0644   LDLCALC 171 (H) 02/14/2023 1041    CBC    Component Value Date/Time   WBC 6.0 06/06/2023 0436   RBC 4.37 06/06/2023 0436   HGB 13.4 06/06/2023 0436   HCT 41.7 06/06/2023 0436   PLT 159 06/06/2023 0436   MCV 95.4 06/06/2023 0436   MCH 30.7 06/06/2023 0436   MCHC 32.1 06/06/2023 0436  RDW 13.0 06/06/2023 0436   LYMPHSABS 2.3 06/04/2023 0529   MONOABS 0.8 06/04/2023 0529   EOSABS 0.1 06/04/2023 0529   BASOSABS 0.0 06/04/2023 0529    Lab Results  Component Value Date   HGBA1C 5.7 06/25/2023      1. Prediabetes (Primary) Labs reveal prediabetes with an A1c of 5.7.  An A1c of 6.5 is supportive of a diagnosis of type 2 diabetes mellitus.  Working on a low carbohydrate diet, exercise, weight loss is recommended in order to prevent progression to type 2 diabetes mellitus.  - POCT glycosylated hemoglobin (Hb A1C)  2. Mixed hyperlipidemia Controlled Nonadherence with Crestor  due to repeated discontinuation after hospitalization for rhabdomyolysis Low-cholesterol diet - rosuvastatin  (CRESTOR ) 20 MG tablet; Take 1 tablet (20 mg total) by mouth daily.  Dispense: 90 tablet; Refill: 3  3. Osteoarthritis of right foot,  unspecified osteoarthritis type With intermittent flares Initiate meloxicam  - meloxicam  (MOBIC ) 7.5 MG tablet; Take 1 tablet (7.5 mg total) by mouth daily.  Dispense: 30 tablet; Refill: 1  4. Non-traumatic rhabdomyolysis Asymptomatic He attributes recurrent symptoms to persistent exposure to heat as a result of his job Advised to stay hydrated - CK  5. Screening for prostate cancer - PSA, total and free  6. Preoperative clearance He has no dyspnea or chest pain and is able to achieve 3 METS EKG revealed no worrisome findings Will send off CK  Will send letter to general surgeon once lab results are back   Meds ordered this encounter  Medications   rosuvastatin  (CRESTOR ) 20 MG tablet    Sig: Take 1 tablet (20 mg total) by mouth daily.    Dispense:  90 tablet    Refill:  3   meloxicam  (MOBIC ) 7.5 MG tablet    Sig: Take 1 tablet (7.5 mg total) by mouth daily.    Dispense:  30 tablet    Refill:  1    Follow-up: Return in about 6 months (around 12/25/2023) for Chronic medical conditions.       Joaquin Mulberry, MD, FAAFP. [facility] [place], [place] [phone]   06/25/2023, 11:52 AM

## 2023-06-26 ENCOUNTER — Ambulatory Visit: Payer: Self-pay | Admitting: Family Medicine

## 2023-06-26 LAB — PSA, TOTAL AND FREE
PSA, Free Pct: 37.5 %
PSA, Free: 0.15 ng/mL
Prostate Specific Ag, Serum: 0.4 ng/mL (ref 0.0–4.0)

## 2023-06-26 LAB — CK: Total CK: 1992 U/L (ref 41–331)

## 2023-06-26 NOTE — Addendum Note (Signed)
 Addended by: Sumeet Geter on: 06/26/2023 11:17 AM   Modules accepted: Orders

## 2023-07-06 ENCOUNTER — Encounter: Payer: Self-pay | Admitting: Family Medicine

## 2023-07-06 ENCOUNTER — Other Ambulatory Visit (HOSPITAL_COMMUNITY): Payer: Self-pay

## 2023-07-06 DIAGNOSIS — M6282 Rhabdomyolysis: Secondary | ICD-10-CM

## 2023-07-06 MED ORDER — GABAPENTIN 300 MG PO CAPS
600.0000 mg | ORAL_CAPSULE | Freq: Two times a day (BID) | ORAL | 1 refills | Status: DC
  Filled 2023-07-06: qty 360, 90d supply, fill #0

## 2023-07-07 ENCOUNTER — Other Ambulatory Visit: Payer: Self-pay | Admitting: Family Medicine

## 2023-07-07 MED ORDER — GABAPENTIN 300 MG PO CAPS: 600.0000 mg | ORAL_CAPSULE | Freq: Two times a day (BID) | ORAL | 1 refills | Status: DC

## 2023-07-10 ENCOUNTER — Ambulatory Visit: Payer: MEDICAID | Attending: Pharmacist | Admitting: Pharmacist

## 2023-07-10 NOTE — Progress Notes (Deleted)
 Patient ID: Dylan [name]                 DOB: [birth year]                    MRN: [id number]      HPI: Dylan [name] is a 58 y.o. male patient referred to lipid clinic by Dr Delbert. PMH is significant for HIV on Biktarvy , bipolar,history of rhabdomyolysis requiring dialysis, no longer on dialysis disorder nontraumatic rhabdomyolysis ( CK level 6809 (06/04/23) went down to 1992 (06/25/23), Chronic viral hepatitis B, NSTEMI, HTN, HLD  Due to CK elevation Crestor  was held and patient was referred to lipid clinic. Previous lipid labs Patient presented today  Reviewed options for lowering LDL cholesterol, including ezetimibe, PCSK-9 inhibitors, bempedoic acid and inclisiran.  Discussed mechanisms of action, dosing, side effects and potential decreases in LDL cholesterol.  Also reviewed cost information and potential options for patient assistance.  Current Medications: none  Intolerances: Crestor  - CK elevation  Risk Factors: premature ASCVD, hx of NSTEMI,  LDL goal: <55 mg/dl  Last lipid lab: TC 819, TG 205,HDL 48, LDL 100  Diet:   Exercise:   Family History:   Relation Problem Comments  Mother Metallurgist) CAD     Father (Deceased) Diabetes   Stroke     Maternal Aunt (Other) Colon cancer (Age: 1)   Diabetes     Maternal Uncle (Deceased) Heart attack   Heart disease     Daughter (Deceased)     Social History:   Labs:  Lipid Panel     Component Value Date/Time   CHOL 261 (H) 02/14/2023 1041   CHOL 158 02/13/2019 1128   TRIG 208 (H) 02/14/2023 1041   HDL 53 02/14/2023 1041   HDL 41 02/13/2019 1128   CHOLHDL 4.9 02/14/2023 1041   VLDL 50 (H) 12/08/2022 0644   LDLCALC 171 (H) 02/14/2023 1041   LABVLDL 20 02/13/2019 1128    Past Medical History:  Diagnosis Date   Anxiety    Arthritis    Bipolar 1 disorder (HCC)    Colon polyps    Depression    GERD (gastroesophageal reflux disease)    Hepatitis B    Human immunodeficiency virus (HIV) (HCC)    Hyperlipidemia     Hypertension    Insomnia due to other mental disorder 02/03/2020   Intentional drug overdose (HCC) 11/26/2021   Neuromuscular disorder (HCC)    neuropathy   Neuropathy    Pre-diabetes    Rhabdomyolysis 02/19/2017   Unilateral primary osteoarthritis, left knee 12/14/2021    Current Outpatient Medications on File Prior to Visit  Medication Sig Dispense Refill   bictegravir-emtricitabine -tenofovir  AF (BIKTARVY ) 50-200-25 MG TABS tablet Take 1 tablet by mouth daily. 30 tablet 0   diclofenac  Sodium (VOLTAREN ) 1 % GEL Apply 4 g topically 4 (four) times daily. 100 g 1   gabapentin  (NEURONTIN ) 300 MG capsule Take 2 capsules (600 mg total) by mouth 2 (two) times daily. 360 capsule 1   meloxicam  (MOBIC ) 7.5 MG tablet Take 1 tablet (7.5 mg total) by mouth daily. 30 tablet 1   rosuvastatin  (CRESTOR ) 20 MG tablet Take 1 tablet (20 mg total) by mouth daily. 90 tablet 3   No current facility-administered medications on file prior to visit.    Allergies  Allergen Reactions   Penicillins Other (See Comments)    Reaction type/severity unknown, childhood allergy   Latex Rash   Tape Rash    Assessment/Plan:  1. Hyperlipidemia -  No problems updated. No problem-specific Assessment & Plan notes found for this encounter.    Thank you,  Robbi Blanch, Pharm.D [place] [facility]. [facility] [address], [place], [place] [address] Phone: [phone]; Fax: [phone]

## 2023-07-11 ENCOUNTER — Emergency Department (HOSPITAL_COMMUNITY): Payer: MEDICAID

## 2023-07-11 ENCOUNTER — Encounter (HOSPITAL_COMMUNITY): Payer: Self-pay

## 2023-07-11 ENCOUNTER — Observation Stay (HOSPITAL_COMMUNITY)
Admission: EM | Admit: 2023-07-11 | Discharge: 2023-07-12 | Disposition: A | Discharge status: Home / Self Care | Payer: MEDICAID | Attending: Internal Medicine | Admitting: Internal Medicine

## 2023-07-11 ENCOUNTER — Other Ambulatory Visit: Payer: Self-pay

## 2023-07-11 DIAGNOSIS — Z87891 Personal history of nicotine dependence: Secondary | ICD-10-CM | POA: Insufficient documentation

## 2023-07-11 DIAGNOSIS — Z6839 Body mass index (BMI) 39.0-39.9, adult: Secondary | ICD-10-CM | POA: Insufficient documentation

## 2023-07-11 DIAGNOSIS — I10 Essential (primary) hypertension: Secondary | ICD-10-CM | POA: Insufficient documentation

## 2023-07-11 DIAGNOSIS — R55 Syncope and collapse: Secondary | ICD-10-CM | POA: Diagnosis present

## 2023-07-11 DIAGNOSIS — Z96652 Presence of left artificial knee joint: Secondary | ICD-10-CM | POA: Insufficient documentation

## 2023-07-11 DIAGNOSIS — M6282 Rhabdomyolysis: Principal | ICD-10-CM | POA: Insufficient documentation

## 2023-07-11 DIAGNOSIS — G629 Polyneuropathy, unspecified: Secondary | ICD-10-CM | POA: Diagnosis not present

## 2023-07-11 DIAGNOSIS — E669 Obesity, unspecified: Secondary | ICD-10-CM | POA: Diagnosis not present

## 2023-07-11 DIAGNOSIS — F3175 Bipolar disorder, in partial remission, most recent episode depressed: Secondary | ICD-10-CM | POA: Insufficient documentation

## 2023-07-11 DIAGNOSIS — F3130 Bipolar disorder, current episode depressed, mild or moderate severity, unspecified: Secondary | ICD-10-CM | POA: Diagnosis present

## 2023-07-11 DIAGNOSIS — B20 Human immunodeficiency virus [HIV] disease: Secondary | ICD-10-CM | POA: Insufficient documentation

## 2023-07-11 DIAGNOSIS — Z79899 Other long term (current) drug therapy: Secondary | ICD-10-CM | POA: Insufficient documentation

## 2023-07-11 DIAGNOSIS — E876 Hypokalemia: Principal | ICD-10-CM

## 2023-07-11 LAB — COMPREHENSIVE METABOLIC PANEL WITH GFR
ALT: 27 U/L (ref 0–44)
AST: 44 U/L — ABNORMAL HIGH (ref 15–41)
Albumin: 3.5 g/dL (ref 3.5–5.0)
Alkaline Phosphatase: 60 U/L (ref 38–126)
Anion gap: 11 (ref 5–15)
BUN: 18 mg/dL (ref 6–20)
CO2: 16 mmol/L — ABNORMAL LOW (ref 22–32)
Calcium: 7.1 mg/dL — ABNORMAL LOW (ref 8.9–10.3)
Chloride: 116 mmol/L — ABNORMAL HIGH (ref 98–111)
Creatinine, Ser: 0.86 mg/dL (ref 0.61–1.24)
GFR, Estimated: >60 mL/min (ref >60)
Glucose, Bld: 73 mg/dL (ref 70–99)
Potassium: 2.8 mmol/L — ABNORMAL LOW (ref 3.5–5.1)
Sodium: 143 mmol/L (ref 135–145)
Total Bilirubin: 0.6 mg/dL (ref 0.0–1.2)
Total Protein: 6.4 g/dL — ABNORMAL LOW (ref 6.5–8.1)

## 2023-07-11 LAB — CK: Total CK: 2404 U/L — ABNORMAL HIGH (ref 49–397)

## 2023-07-11 LAB — CBC WITH DIFFERENTIAL/PLATELET
Abs Immature Granulocytes: 0.03 10*3/uL (ref 0.00–0.07)
Basophils Absolute: 0 10*3/uL (ref 0.0–0.1)
Basophils Relative: 0 %
Eosinophils Absolute: 0 10*3/uL (ref 0.0–0.5)
Eosinophils Relative: 0 %
HCT: 48.9 % (ref 39.0–52.0)
Hemoglobin: 15.3 g/dL (ref 13.0–17.0)
Immature Granulocytes: 0 %
Lymphocytes Relative: 25 %
Lymphs Abs: 2.5 10*3/uL (ref 0.7–4.0)
MCH: 30.1 pg (ref 26.0–34.0)
MCHC: 31.3 g/dL (ref 30.0–36.0)
MCV: 96.1 fL (ref 80.0–100.0)
Monocytes Absolute: 1.1 10*3/uL — ABNORMAL HIGH (ref 0.1–1.0)
Monocytes Relative: 11 %
Neutro Abs: 6.4 10*3/uL (ref 1.7–7.7)
Neutrophils Relative %: 64 %
Platelets: 181 10*3/uL (ref 150–400)
RBC: 5.09 MIL/uL (ref 4.22–5.81)
RDW: 14 % (ref 11.5–15.5)
WBC: 10.1 10*3/uL (ref 4.0–10.5)
nRBC: 0 % (ref 0.0–0.2)

## 2023-07-11 LAB — TROPONIN I (HIGH SENSITIVITY)
Troponin I (High Sensitivity): 8 ng/L (ref <18)
Troponin I (High Sensitivity): 8 ng/L (ref <18)

## 2023-07-11 LAB — MAGNESIUM: Magnesium: 2.1 mg/dL (ref 1.7–2.4)

## 2023-07-11 MED ORDER — ALBUTEROL SULFATE (2.5 MG/3ML) 0.083% IN NEBU: 2.5000 mg | INHALATION_SOLUTION | RESPIRATORY_TRACT | Status: DC | PRN

## 2023-07-11 MED ORDER — GABAPENTIN 300 MG PO CAPS
600.0000 mg | ORAL_CAPSULE | Freq: Two times a day (BID) | ORAL | Status: DC
  Administered 2023-07-11 – 2023-07-12 (×2): 600 mg via ORAL
  Filled 2023-07-11 (×2): qty 2

## 2023-07-11 MED ORDER — LACTATED RINGERS IV SOLN: INTRAVENOUS | Status: AC

## 2023-07-11 MED ORDER — BICTEGRAVIR-EMTRICITAB-TENOFOV 50-200-25 MG PO TABS
1.0000 | ORAL_TABLET | Freq: Every day | ORAL | Status: DC
  Administered 2023-07-11 – 2023-07-12 (×2): 1 via ORAL
  Filled 2023-07-11 (×2): qty 1

## 2023-07-11 MED ORDER — POTASSIUM CHLORIDE CRYS ER 20 MEQ PO TBCR
60.0000 meq | EXTENDED_RELEASE_TABLET | Freq: Once | ORAL | Status: AC
  Administered 2023-07-11: 60 meq via ORAL
  Filled 2023-07-11: qty 3

## 2023-07-11 MED ORDER — ONDANSETRON HCL 4 MG/2ML IJ SOLN: 4.0000 mg | Freq: Four times a day (QID) | INTRAMUSCULAR | Status: DC | PRN

## 2023-07-11 MED ORDER — LACTATED RINGERS IV BOLUS
1000.0000 mL | Freq: Once | INTRAVENOUS | Status: AC
  Administered 2023-07-11: 1000 mL via INTRAVENOUS

## 2023-07-11 MED ORDER — TRAZODONE HCL 100 MG PO TABS
100.0000 mg | ORAL_TABLET | Freq: Once | ORAL | Status: AC
  Administered 2023-07-11: 100 mg via ORAL
  Filled 2023-07-11: qty 1

## 2023-07-11 MED ORDER — ACETAMINOPHEN 325 MG PO TABS: 650.0000 mg | ORAL_TABLET | Freq: Four times a day (QID) | ORAL | Status: DC | PRN

## 2023-07-11 MED ORDER — ENOXAPARIN SODIUM 40 MG/0.4ML IJ SOSY
40.0000 mg | PREFILLED_SYRINGE | INTRAMUSCULAR | Status: DC
  Administered 2023-07-11 – 2023-07-12 (×2): 40 mg via SUBCUTANEOUS
  Filled 2023-07-11 (×2): qty 0.4

## 2023-07-11 MED ORDER — CALCIUM GLUCONATE-NACL 1-0.675 GM/50ML-% IV SOLN
1.0000 g | Freq: Once | INTRAVENOUS | Status: AC
  Administered 2023-07-11: 1000 mg via INTRAVENOUS
  Filled 2023-07-11: qty 50

## 2023-07-11 MED ORDER — ONDANSETRON HCL 4 MG PO TABS: 4.0000 mg | ORAL_TABLET | Freq: Four times a day (QID) | ORAL | Status: DC | PRN

## 2023-07-11 MED ORDER — ACETAMINOPHEN 650 MG RE SUPP: 650.0000 mg | Freq: Four times a day (QID) | RECTAL | Status: DC | PRN

## 2023-07-11 NOTE — ED Provider Notes (Signed)
 Care transferred to me.  Patient has been given a liter of fluids.  His CK is around where it was last time at 2400.  However more concerning is his hypokalemia, acidosis with a bicarbonate of 16 and hypocalcemia of 7.1.  He is continuing to complain of cramps despite a liter plus of fluid.  Will continue fluids and replete his potassium and calcium .  While his renal function is okay I think he will need admission for further fluid management as well as electrolyte replacement. Discussed with Dr. Huey for admission.   EKG Interpretation Date/Time:  Wednesday July 11 2023 06:46:57 EDT Ventricular Rate:  84 PR Interval:  166 QRS Duration:  106 QT Interval:  406 QTC Calculation: 479 R Axis:   68  Text Interpretation: Normal sinus rhythm Minimal voltage criteria for LVH, may be normal variant ( Sokolow-Lyon ) no significant change since May 2025 Confirmed by Freddi Hamilton [phone]) on 07/11/2023 10:48:52 AM           Freddi Hamilton, MD 07/11/23 [phone]

## 2023-07-11 NOTE — Progress Notes (Signed)
 Patient is requesting to REVOKE his CONFIDENTIAL STATUS. I called admissions office to request.

## 2023-07-11 NOTE — H&P (Signed)
 History and Physical  Dylan [name] FMW:[id number] DOB: 12-03-1965 DOA: 07/11/2023  PCP: Delbert Clam, MD   Chief Complaint: Muscle aches, cramps  HPI: Dylan [name] is a 58 y.o. male with medical history significant for bipolar 1 disorder, HIV, recurrent rhabdomyolysis being admitted to the hospital with electrolyte abnormalities and recurrent rhabdomyolysis.  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  Feels he got dehydrated.  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  He noticed the same day that his urine became dark, he started having muscle cramping and came to the ER today for evaluation.  Today after receiving IV fluids, he still having some muscle cramping and aching.  Review of Systems: Please see HPI for pertinent positives and negatives. A complete 10 system review of systems are otherwise negative.  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   Intentional drug overdose (HCC) 11/26/2021   Neuromuscular disorder (HCC)    neuropathy   Neuropathy    Pre-diabetes    Rhabdomyolysis 02/19/2017   Unilateral primary osteoarthritis, left knee 12/14/2021   Past Surgical History:  Procedure Laterality Date   FOOT ARTHRODESIS Right 09/17/2019   Procedure: FUSION RIGHT LISFRANC JOINT;  Surgeon: Harden Jerona GAILS, MD;  Location: [facility] OR;  Service: Orthopedics;  Laterality: Right;   FOOT ARTHRODESIS Right 09/2019    HEMORROIDECTOMY     IR FLUORO GUIDE CV LINE RIGHT  02/22/2017   IR US  GUIDE VASC ACCESS RIGHT  02/22/2017   TOTAL KNEE ARTHROPLASTY Left 12/14/2021   Procedure: LEFT TOTAL KNEE ARTHROPLASTY;  Surgeon: Harden Jerona GAILS, MD;  Location: [facility] OR;  Service: Orthopedics;  Laterality: Left;   TUMOR REMOVAL     From Chest   Social History:  reports that he has quit smoking. His smoking use included e-cigarettes and cigarettes. He has a 12.5 pack-year smoking history. He has never used smokeless tobacco. He reports current alcohol use. He reports that he does not currently use drugs after having used the following drugs: Cocaine and Marijuana.  Allergies  Allergen Reactions   Penicillins Other (See Comments)    Reaction type/severity unknown, childhood allergy   Latex Rash   Tape Rash    Family History  Problem Relation Age of Onset   CAD Mother    Diabetes Father    Stroke Father    Colon cancer Maternal Aunt 60   Diabetes Maternal Aunt    Heart disease Maternal Uncle    Heart attack Maternal Uncle    Esophageal cancer Neg Hx    Rectal cancer Neg Hx    Stomach cancer Neg Hx      Prior to Admission medications   Medication Sig Start Date End Date Taking? Authorizing Provider  bictegravir-emtricitabine -tenofovir  AF (BIKTARVY ) 50-200-25 MG TABS tablet Take 1 tablet by mouth daily. 04/21/23   Donnajean,  Lynwood DEL, PA-C  diclofenac  Sodium (VOLTAREN ) 1 % GEL Apply 4 g topically 4 (four) times daily. 06/19/23   Newlin, Enobong, MD  gabapentin  (NEURONTIN ) 300 MG capsule Take 2 capsules (600 mg total) by mouth 2 (two) times daily. 07/07/23   Newlin, Enobong, MD  meloxicam  (MOBIC ) 7.5 MG tablet Take 1 tablet (7.5 mg total) by mouth daily. 06/25/23   Newlin, Enobong, MD  rosuvastatin  (CRESTOR ) 20 MG tablet Take 1 tablet (20 mg total) by mouth daily. 06/25/23   Delbert Clam, MD    Physical Exam: BP (!) 153/85 (BP Location: Right Arm)   Pulse 84   Temp 98 F (36.7 C) (Oral)   Resp 18   Ht 6' 2 (1.88  m)   Wt 108.2 kg   SpO2 100%   BMI 30.63 kg/m  General:  Alert, oriented, calm, in no acute distress  Eyes: EOMI, clear conjuctivae, white sclerea Neck: supple, no masses, trachea mildline  Cardiovascular: RRR, no murmurs or rubs, no peripheral edema  Respiratory: clear to auscultation bilaterally, no wheezes, no crackles  Abdomen: soft, nontender, nondistended, normal bowel tones heard  Skin: dry, no rashes  Musculoskeletal: no joint effusions, normal range of motion  Psychiatric: appropriate affect, normal speech  Neurologic: extraocular muscles intact, clear speech, moving all extremities with intact sensorium         Labs on Admission:  Basic Metabolic Panel: Recent Labs  Lab 07/11/23 0821 07/11/23 1102  NA 143  --   K 2.8*  --   CL 116*  --   CO2 16*  --   GLUCOSE 73  --   BUN 18  --   CREATININE 0.86  --   CALCIUM  7.1*  --   MG  --  2.1   Liver Function Tests: Recent Labs  Lab 07/11/23 0821  AST 44*  ALT 27  ALKPHOS 60  BILITOT 0.6  PROT 6.4*  ALBUMIN 3.5   No results for input(s): LIPASE, AMYLASE in the last 168 hours. No results for input(s): AMMONIA in the last 168 hours. CBC: Recent Labs  Lab 07/11/23 0821  WBC 10.1  NEUTROABS 6.4  HGB 15.3  HCT 48.9  MCV 96.1  PLT 181   Cardiac Enzymes: Recent Labs  Lab 07/11/23 0821  CKTOTAL 2,404*   BNP (last 3 results) No results for input(s): BNP in the last 8760 hours.  ProBNP (last 3 results) No results for input(s): PROBNP in the last 8760 hours.  CBG: No results for input(s): GLUCAP in the last 168 hours.  Radiological Exams on Admission: CT Cervical Spine Wo Contrast Result Date: 07/11/2023 CLINICAL DATA:  58 year old male with dizziness, syncope, fell backwards striking head on ground. EXAM: CT CERVICAL SPINE WITHOUT CONTRAST TECHNIQUE: Multidetector CT imaging of the cervical spine was performed without intravenous contrast. Multiplanar CT image reconstructions were also  generated. RADIATION DOSE REDUCTION: This exam was performed according to the departmental dose-optimization program which includes automated exposure control, adjustment of the mA and/or kV according to patient size and/or use of iterative reconstruction technique. COMPARISON:  Head CT today. FINDINGS: Alignment: Mild dextroconvex cervical scoliosis with straightening of lordosis. Cervicothoracic junction alignment is within normal limits. Bilateral posterior element alignment is within normal limits. Skull base and vertebrae: Bone mineralization is within normal limits. Visualized skull base is intact. No atlanto-occipital dissociation. C1 and C2 appear intact and aligned. No acute osseous abnormality identified. Soft tissues and spinal canal: No prevertebral fluid or swelling. No visible canal hematoma. Negative visible noncontrast  neck soft tissues. Disc levels: Advanced chronic disc and endplate degeneration in the cervical spine at C5-C6, asymmetric to the right at C4-C5. Subsequent mild spinal stenosis suspected. Upper chest: Visible upper thoracic levels appear intact. Negative visible noncontrast thoracic inlet. IMPRESSION: 1. No acute traumatic injury identified in the cervical spine. 2. Advanced chronic disc and endplate degeneration at C5-C6, on the right at C4-C5. Electronically Signed   By: VEAR Hurst M.D.   On: 07/11/2023 07:11   CT Head Wo Contrast Result Date: 07/11/2023 CLINICAL DATA:  58 year old male with dizziness, syncope, fell backwards striking head on ground. EXAM: CT HEAD WITHOUT CONTRAST TECHNIQUE: Contiguous axial images were obtained from the base of the skull through the vertex without intravenous contrast. RADIATION DOSE REDUCTION: This exam was performed according to the departmental dose-optimization program which includes automated exposure control, adjustment of the mA and/or kV according to patient size and/or use of iterative reconstruction technique. COMPARISON:  Head CT  02/19/2017. FINDINGS: Brain: Cerebral volume is stable since 2019 and within normal limits for age. Intracranial dural calcification is stable including along the falx. No midline shift, ventriculomegaly, mass effect, evidence of mass lesion, intracranial hemorrhage or evidence of cortically based acute infarction. Gray-white matter differentiation is within normal limits throughout the brain. Vascular: No suspicious intracranial vascular hyperdensity. Skull: Chronic left lamina papyracea fracture. Stable visualized osseous structures. No acute osseous abnormality identified. Sinuses/Orbits: Visualized paranasal sinuses and mastoids are stable and well aerated. Other: No acute orbit or scalp soft tissue injury identified. IMPRESSION: 1. No acute traumatic injury identified. 2. Stable since 2019 and normal noncontrast CT appearance of the brain. 3. Chronic left lamina papyracea fracture. Electronically Signed   By: VEAR Hurst M.D.   On: 07/11/2023 07:08   Assessment/Plan Dylan [name] is a 58 y.o. male with medical history significant for bipolar 1 disorder, HIV, recurrent rhabdomyolysis being admitted to the hospital with electrolyte abnormalities and recurrent rhabdomyolysis.  Rhabdomyolysis-he seems to be prone to this, this is nontraumatic, likely due to dehydration in the setting of very high temperatures and working outdoors doing Aeronautical engineer.  Renal function is stable, and CK is not significantly elevated from what appears to be his baseline however he is symptomatic. -Observation admission -Regular diet -Given history of substance abuse, will check urine drug screen -Copious IV fluids with LR -Trend CK and renal function with morning labs  HIV-continue home Biktarvy   History of neuropathy-continue home Neurontin   DVT prophylaxis: Lovenox      Code Status: Full Code  Consults called: None  Admission status: Observation  Time spent: 48 minutes  Dylan [name] CHRISTELLA Gail MD [facility] Pager [phone]  If 7PM-7AM, please contact night-coverage [email or web address] Password [email or web address]  07/11/2023, 11:37 AM

## 2023-07-11 NOTE — Plan of Care (Signed)

## 2023-07-11 NOTE — ED Notes (Signed)
 Pt given ginger ale and crackers. Resting comfortably

## 2023-07-11 NOTE — ED Provider Notes (Signed)
 [place] EMERG[facility]Y DEPARTMENT AT [facility] Provider Note  CSN: [id number] Arrival date & time: 07/11/23 0555  Chief Complaint(s) Loss of Consciousness (PT BIB [facility]. Per EMS PT has been working outside the past two days. [place] the PT started feeling dizzy and had a syncopal episode and fell backwards and hit his head on the ground.)  HPI Tayshon Winker is a 58 y.o. male with PMH HIV, HTN, HLD, rhabdomyolysis who presents emergency room for evaluation of a syncopal episode.  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  He states he did not feel very well over the last few days.  Was going to get some water  and suffered a syncopal episode.  Positive head strike with pain to the left occipital scalp but arrives with no laceration.  Denies chest pain, shortness of breath, abdominal pain, nausea, vomiting or other systemic symptoms.   Past Medical History 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   Intentional drug overdose (HCC) 11/26/2021   Neuromuscular disorder (HCC)    neuropathy   Neuropathy    Pre-diabetes    Rhabdomyolysis 02/19/2017   Unilateral primary osteoarthritis, left knee 12/14/2021   Patient Active Problem List   Diagnosis Date Noted   MDD (major depressive disorder), recurrent, severe, with psychosis (HCC) 03/12/2023   Transaminitis 01/04/2023   Bipolar 1 disorder, depressed (HCC) 12/05/2022   Non-traumatic rhabdomyolysis 12/04/2022   Weakness 11/03/2022   Cough 11/03/2022   Essential hypertension 11/03/2022   Peripheral neuropathy 11/03/2022   Bipolar disorder (HCC) 08/27/2022   Cannabis use disorder  08/27/2022   Bipolar disorder, most recent episode depressed (HCC) 08/26/2022   Suicidal ideation 08/26/2022   Rhabdomyolysis 07/01/2022   Non-ST elevated myocardial infarction (HCC) 06/03/2022   Dyslipidemia 06/02/2022   Polysubstance abuse (HCC) 06/02/2022   Obesity (BMI 30-39.9) 06/02/2022   Alcohol use disorder 04/12/2022   PTSD (post-traumatic stress disorder) 04/12/2022   S/P total knee arthroplasty, left 12/14/2021   Total knee replacement status, left 12/14/2021   Chronic fatigue 11/16/2021   Abnormal EKG 11/16/2021   Overweight (BMI 25.0-29.9) 11/16/2021   Elevated LFTs 07/03/2021   Right foot ulcer (HCC) 07/03/2021   Onychomycosis 04/19/2021   Stimulant use disorder 03/16/2021   Nodular radiologic density 02/02/2021   Lesion of liver 02/02/2021   Liver disease 02/02/2021   Vitamin D  deficiency 01/18/2021   Methamphetamine abuse (HCC) 01/17/2021   Lesion of pancreas 01/17/2021   Substance induced mood disorder (HCC) 10/26/2020   Fatty liver 03/09/2020   Generalized anxiety disorder 02/03/2020   Lisfranc dislocation, right, initial encounter    Prediabetes 07/22/2019   Tobacco use disorder 07/10/2018   AKI (acute kidney injury) (HCC)    Chronic 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 coma (HCC)    Acute hepatitis 02/19/2017   Degenerative disc disease 08/21/2011   HIV disease (HCC) 08/17/2011   Home Medication(s) Prior to Admission medications   Medication Sig Start Date End Date Taking? Authorizing Provider  bictegravir-emtricitabine -tenofovir  AF (BIKTARVY ) 50-200-25 MG TABS tablet Take 1 tablet by mouth daily. 04/21/23  Donnajean Lynwood DEL, PA-C  diclofenac  Sodium (VOLTAREN ) 1 % GEL Apply 4 g topically 4 (four) times daily. 06/19/23   Newlin, Enobong, MD  gabapentin  (NEURONTIN ) 300 MG capsule Take 2 capsules (600 mg total) by mouth 2 (two) times daily. 07/07/23   Newlin, Enobong, MD  meloxicam  (MOBIC ) 7.5 MG tablet Take 1 tablet (7.5 mg total) by mouth  daily. 06/25/23   Newlin, Enobong, MD  rosuvastatin  (CRESTOR ) 20 MG tablet Take 1 tablet (20 mg total) by mouth daily. 06/25/23   Delbert Clam, MD                                                                                                                                    Past Surgical History Past Surgical History:  Procedure Laterality Date   FOOT ARTHRODESIS Right 09/17/2019   Procedure: FUSION RIGHT LISFRANC JOINT;  Surgeon: Harden Jerona GAILS, MD;  Location: [facility] OR;  Service: Orthopedics;  Laterality: Right;   FOOT ARTHRODESIS Right 09/2019   HEMORROIDECTOMY     IR FLUORO GUIDE CV LINE RIGHT  02/22/2017   IR US  GUIDE VASC ACCESS RIGHT  02/22/2017   TOTAL KNEE ARTHROPLASTY Left 12/14/2021   Procedure: LEFT TOTAL KNEE ARTHROPLASTY;  Surgeon: Harden Jerona GAILS, MD;  Location: [facility] OR;  Service: Orthopedics;  Laterality: Left;   TUMOR REMOVAL     From Chest   Family History Family History  Problem Relation Age of Onset   CAD Mother    Diabetes Father    Stroke Father    Colon cancer Maternal Aunt 60   Diabetes Maternal Aunt    Heart disease Maternal Uncle    Heart attack Maternal Uncle    Esophageal cancer Neg Hx    Rectal cancer Neg Hx    Stomach cancer Neg Hx     Social History Social History   Tobacco Use   Smoking status: Former    Current packs/day: 0.50    Average packs/day: 0.5 packs/day for 25.0 years (12.5 ttl pk-yrs)    Types: E-cigarettes, Cigarettes   Smokeless tobacco: Never   Tobacco comments:    Vapes daily  Vaping Use   Vaping status: Every Day   Start date: 12/09/2020   Substances: Nicotine   Substance Use Topics   Alcohol use: Yes    Comment: last drink March 2024   Drug use: Not Currently    Types: Cocaine, Marijuana    Comment: Last used cocaine March 2024; last used cannabis 2022   Allergies Penicillins, Latex, and Tape  Review of Systems Review of Systems  Neurological:  Positive for syncope and headaches.    Physical Exam Vital Signs  I  have reviewed the triage vital signs BP 133/86 (BP Location: Right Arm)   Pulse 85   Temp 98 F (36.7 C) (Oral)   Resp 18   Ht 6' 2 (1.88 m)   Wt 108.2 kg   SpO2 98%  BMI 30.63 kg/m   Physical Exam Constitutional:      General: He is not in acute distress.    Appearance: Normal appearance.  HENT:     Head: Normocephalic and atraumatic.     Nose: No congestion or rhinorrhea.  Eyes:     General:        Right eye: No discharge.        Left eye: No discharge.     Extraocular Movements: Extraocular movements intact.     Pupils: Pupils are equal, round, and reactive to light.  Cardiovascular:     Rate and Rhythm: Normal rate and regular rhythm.     Heart sounds: No murmur heard. Pulmonary:     Effort: No respiratory distress.     Breath sounds: No wheezing or rales.  Abdominal:     General: There is no distension.     Tenderness: There is no abdominal tenderness.  Musculoskeletal:        General: Normal range of motion.     Cervical back: Normal range of motion.  Skin:    General: Skin is warm and dry.  Neurological:     General: No focal deficit present.     Mental Status: He is alert.     Cranial Nerves: No cranial nerve deficit.     Sensory: No sensory deficit.     Motor: No weakness.     ED Results and Treatments Labs (all labs ordered are listed, but only abnormal results are displayed) Labs Reviewed  COMPREHENSIVE METABOLIC PANEL WITH GFR  CBC WITH DIFFERENTIAL/PLATELET  CK  TROPONIN I (HIGH SENSITIVITY)                                                                                                                          Radiology No results found.  Pertinent labs & imaging results that were available during my care of the patient were reviewed by me and considered in my medical decision making (see MDM for details).  Medications Ordered in ED Medications  lactated ringers  bolus 1,000 mL (has no administration in time range)                                                                                                                                      Procedures Procedures  (including critical care time)  Medical Decision Making / ED Course  This patient presents to the ED for concern of syncope, this involves an extensive number of treatment options, and is a complaint that carries with it a high risk of complications and morbidity.  The differential diagnosis includes orthostatic syncope, cardiogenic syncope, vasovagal syncope, electrolyte abnormality, dehydration, dysrhythmia, vasovagal, Hypoglycemia, Seizure, Autonomic Insufficiency  MDM: Patient seen emergency room for evaluation of syncope.  Physical exam largely unremarkable outside of some mild swelling to the posterior occiput.  Patient pending completion of laboratory evaluation and imaging at time of signout.  Fluid resuscitation begun.  Please see provider signout for continuation of workup.   Additional history obtained:  -External records from outside source obtained and reviewed including: Chart review including previous notes, labs, imaging, consultation notes   Lab Tests: -I ordered, reviewed, and interpreted labs.   The pertinent results include:   Labs Reviewed  COMPREHENSIVE METABOLIC PANEL WITH GFR  CBC WITH DIFFERENTIAL/PLATELET  CK  TROPONIN I (HIGH SENSITIVITY)      Imaging Studies ordered: I ordered imaging studies including CT head and C-spine and these are pending   Medicines ordered and prescription drug management: Meds ordered this encounter  Medications   lactated ringers  bolus 1,000 mL    -I have reviewed the patients home medicines and have made adjustments as needed  Critical interventions none    Social Determinants of Health:  Factors impacting patients care include: none   Reevaluation: After the interventions noted above, I reevaluated the patient and found that they have :stayed the same  Co morbidities that  complicate the patient evaluation  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   Intentional drug overdose (HCC) 11/26/2021   Neuromuscular disorder (HCC)    neuropathy   Neuropathy    Pre-diabetes    Rhabdomyolysis 02/19/2017   Unilateral primary osteoarthritis, left knee 12/14/2021      Dispostion: I considered admission for this patient, and disposition pending completion of laboratory evaluation and imaging studies.  Please see provider center note for continuation of workup.     Final Clinical Impression(s) / ED Diagnoses Final diagnoses:  None     @PCDICTATION @    Albertina Dixon, MD 07/11/23 [phone]

## 2023-07-11 NOTE — ED Triage Notes (Signed)
 PT BIB [facility]. Per EMS PT has been working outside the past two days. [place] the PT started feeling dizzy and had a syncopal episode and fell backwards and hit his head on the ground.

## 2023-07-12 DIAGNOSIS — M6282 Rhabdomyolysis: Secondary | ICD-10-CM | POA: Diagnosis not present

## 2023-07-12 LAB — BASIC METABOLIC PANEL WITH GFR
Anion gap: 4 — ABNORMAL LOW (ref 5–15)
BUN: 24 mg/dL — ABNORMAL HIGH (ref 6–20)
CO2: 23 mmol/L (ref 22–32)
Calcium: 8.6 mg/dL — ABNORMAL LOW (ref 8.9–10.3)
Chloride: 110 mmol/L (ref 98–111)
Creatinine, Ser: 1.02 mg/dL (ref 0.61–1.24)
GFR, Estimated: >60 mL/min (ref >60)
Glucose, Bld: 107 mg/dL — ABNORMAL HIGH (ref 70–99)
Potassium: 3.9 mmol/L (ref 3.5–5.1)
Sodium: 137 mmol/L (ref 135–145)

## 2023-07-12 LAB — CBC
HCT: 39.5 % (ref 39.0–52.0)
Hemoglobin: 13 g/dL (ref 13.0–17.0)
MCH: 30.4 pg (ref 26.0–34.0)
MCHC: 32.9 g/dL (ref 30.0–36.0)
MCV: 92.5 fL (ref 80.0–100.0)
Platelets: 154 10*3/uL (ref 150–400)
RBC: 4.27 MIL/uL (ref 4.22–5.81)
RDW: 13.9 % (ref 11.5–15.5)
WBC: 5.2 10*3/uL (ref 4.0–10.5)
nRBC: 0 % (ref 0.0–0.2)

## 2023-07-12 LAB — CK
Total CK: 3873 U/L — ABNORMAL HIGH (ref 49–397)
Total CK: 3299 U/L — ABNORMAL HIGH (ref 49–397)

## 2023-07-12 MED ORDER — PHENOL 1.4 % MT LIQD
1.0000 | OROMUCOSAL | Status: DC | PRN
  Administered 2023-07-12: 1 via OROMUCOSAL
  Filled 2023-07-12: qty 177

## 2023-07-12 MED ORDER — LACTATED RINGERS IV SOLN: INTRAVENOUS | Status: DC

## 2023-07-12 NOTE — Progress Notes (Signed)
 Discharge instructions were reviewed with the patient. Pt encouraged to drink plenty of fluids (water ).He denied questions or concerns at this time.IV removed site is clean dry and intact.

## 2023-07-12 NOTE — Discharge Summary (Signed)
 Physician Discharge Summary   Patient: Dylan [name] MRN: [id number] DOB: October 06, 1965  Admit date:     07/11/2023  Discharge date: 07/12/23  Discharge Physician: Delon Herald   PCP: Delbert Clam, MD   Recommendations at discharge:   Avoid heat where possible Ensure excellent hydration, especially during times of heat exposure Avoid NSAIDs including meloxicam  Follow up with Dr. Newlin in 1-2 weeks  Discharge Diagnoses: Active Problems:   Rhabdomyolysis   HIV disease (HCC)   Peripheral neuropathy   Obesity (BMI 30-39.9)   Bipolar disorder, most recent episode depressed [facility])    Hospital Course: 58yo with h/o bipolar d/o, HTN, and recurrent rhabdomyolysis who presented on 7/2 with myalgias.  Unremarkable evaluation and labs on presentation.  Monitored overnight.  Assessment and Plan:  Rhabdomyolysis Recurrent issue, nontraumatic Likely due to dehydration in the setting of very high temperatures and working outdoors doing landscaping Renal function is stable CK is not significantly elevated from what appears to be his baseline and appears to have peaked and is now downtrending Observation overnight Given history of substance abuse, urine drug screen ordered but not performed; patient denies recent use Copious IV fluids with LR Based on clinical and lab improvement, will dc to home   HIV Continue home Biktarvy  There is a care report of rhabdo with Biktarvy ; will defer to ID   Bipolar d/o He does not appear to be taking medications for this issue at this time     Neuropathy There are case reports of rhabdo from gabapentin ; the risk is higher from pregabalin (2-3%) Will continue gabapentin  for now but consider alternative therapies as an outpateint Avoid NSAIDs including meloxicam   Class 1 obesity Body mass index is 30.63 kg/m.SABRA  Weight loss should be encouraged Outpatient PCP/bariatric medicine f/u encouraged Significantly low or high BMI is associated with  higher medical risk including morbidity and mortality         Consultants: None   Procedures: None   Antibiotics: None   Pain control - [place]  Controlled Substance Reporting System database was reviewed. and patient was instructed, not to drive, operate heavy machinery, perform activities at heights, swimming or participation in water  activities or provide baby-sitting services while on Pain, Sleep and Anxiety Medications; until their outpatient Physician has advised to do so again. Also recommended to not to take more than prescribed Pain, Sleep and Anxiety Medications.   Disposition: Home Diet recommendation:  Regular diet DISCHARGE MEDICATION: Allergies as of 07/12/2023       Reactions   Penicillins Other (See Comments)   Reaction type/severity unknown, childhood allergy   Latex Rash   Tape Rash        Medication List     PAUSE taking these medications    rosuvastatin  20 MG tablet Wait to take this until your doctor or other care provider tells you to start again. Commonly known as: Crestor  Take 1 tablet (20 mg total) by mouth daily. What changed: when to take this       STOP taking these medications    meloxicam  7.5 MG tablet Commonly known as: MOBIC        TAKE these medications    Biktarvy  50-200-25 MG Tabs tablet Generic drug: bictegravir-emtricitabine -tenofovir  AF Take 1 tablet by mouth daily.   diclofenac  Sodium 1 % Gel Commonly known as: Voltaren  Apply 4 g topically 4 (four) times daily. What changed:  when to take this additional instructions   gabapentin  300 MG capsule Commonly known as: NEURONTIN  Take 2 capsules (600  mg total) by mouth 2 (two) times daily. What changed: when to take this   traZODone  150 MG tablet Commonly known as: DESYREL  Take 150 mg by mouth at bedtime.        Discharge Exam:   Subjective: Feeling better, less achy muscles today.  Wants to go home.  No longer taking statin to avoid this  issue.   Objective: Vitals:   07/12/23 0445 07/12/23 1257  BP: 105/73 118/83  Pulse: 63 70  Resp: 17 18  Temp: 97.7 F (36.5 C) 97.7 F (36.5 C)  SpO2: 97% 100%    Intake/Output Summary (Last 24 hours) at 07/12/2023 1552 Last data filed at 07/12/2023 1300 Gross per 24 hour  Intake 3669.81 ml  Output --  Net 3669.81 ml   Filed Weights   07/11/23 0617  Weight: 108.2 kg    Exam:  General:  Appears calm and comfortable and is in NAD Eyes:   normal lids, iris ENT:  grossly normal hearing, lips & tongue, mmm Cardiovascular:  RRR, no m/r/g. No LE edema.  Respiratory:   CTA bilaterally with no wheezes/rales/rhonchi.  Normal respiratory effort. Abdomen:  soft, NT, ND Skin:  no rash or induration seen on limited exam Musculoskeletal:  grossly normal tone BUE/BLE, good ROM, no bony abnormality Psychiatric:  blunted mood and affect, speech fluent and appropriate, AOx3 Neurologic:  CN 2-12 grossly intact, moves all extremities in coordinated fashion  Data Reviewed: I have reviewed the patient's lab results since admission.  Pertinent labs for today include:   Unremarkable BMP CK 2404, 3873, 3299 Normal CBC       Condition at discharge: improving  The results of significant diagnostics from this hospitalization (including imaging, microbiology, ancillary and laboratory) are listed below for reference.   Imaging Studies: CT Cervical Spine Wo Contrast Result Date: 07/11/2023 CLINICAL DATA:  58 year old female with dizziness, syncope, fell backwards striking head on ground. EXAM: CT CERVICAL SPINE WITHOUT CONTRAST TECHNIQUE: Multidetector CT imaging of the cervical spine was performed without intravenous contrast. Multiplanar CT image reconstructions were also generated. RADIATION DOSE REDUCTION: This exam was performed according to the departmental dose-optimization program which includes automated exposure control, adjustment of the mA and/or kV according to patient size and/or use  of iterative reconstruction technique. COMPARISON:  Head CT today. FINDINGS: Alignment: Mild dextroconvex cervical scoliosis with straightening of lordosis. Cervicothoracic junction alignment is within normal limits. Bilateral posterior element alignment is within normal limits. Skull base and vertebrae: Bone mineralization is within normal limits. Visualized skull base is intact. No atlanto-occipital dissociation. C1 and C2 appear intact and aligned. No acute osseous abnormality identified. Soft tissues and spinal canal: No prevertebral fluid or swelling. No visible canal hematoma. Negative visible noncontrast neck soft tissues. Disc levels: Advanced chronic disc and endplate degeneration in the cervical spine at C5-C6, asymmetric to the right at C4-C5. Subsequent mild spinal stenosis suspected. Upper chest: Visible upper thoracic levels appear intact. Negative visible noncontrast thoracic inlet. IMPRESSION: 1. No acute traumatic injury identified in the cervical spine. 2. Advanced chronic disc and endplate degeneration at C5-C6, on the right at C4-C5. Electronically Signed   By: VEAR Hurst M.D.   On: 07/11/2023 07:11   CT Head Wo Contrast Result Date: 07/11/2023 CLINICAL DATA:  58 year old female with dizziness, syncope, fell backwards striking head on ground. EXAM: CT HEAD WITHOUT CONTRAST TECHNIQUE: Contiguous axial images were obtained from the base of the skull through the vertex without intravenous contrast. RADIATION DOSE REDUCTION: This exam was performed according  to the departmental dose-optimization program which includes automated exposure control, adjustment of the mA and/or kV according to patient size and/or use of iterative reconstruction technique. COMPARISON:  Head CT 02/19/2017. FINDINGS: Brain: Cerebral volume is stable since 2019 and within normal limits for age. Intracranial dural calcification is stable including along the falx. No midline shift, ventriculomegaly, mass effect, evidence of mass  lesion, intracranial hemorrhage or evidence of cortically based acute infarction. Gray-white matter differentiation is within normal limits throughout the brain. Vascular: No suspicious intracranial vascular hyperdensity. Skull: Chronic left lamina papyracea fracture. Stable visualized osseous structures. No acute osseous abnormality identified. Sinuses/Orbits: Visualized paranasal sinuses and mastoids are stable and well aerated. Other: No acute orbit or scalp soft tissue injury identified. IMPRESSION: 1. No acute traumatic injury identified. 2. Stable since 2019 and normal noncontrast CT appearance of the brain. 3. Chronic left lamina papyracea fracture. Electronically Signed   By: VEAR Hurst M.D.   On: 07/11/2023 07:08      Labs: CBC: Recent Labs  Lab 07/11/23 0821 07/12/23 0420  WBC 10.1 5.2  NEUTROABS 6.4  --   HGB 15.3 13.0  HCT 48.9 39.5  MCV 96.1 92.5  PLT 181 154   Basic Metabolic Panel: Recent Labs  Lab 07/11/23 0821 07/11/23 1102 07/12/23 0420  NA 143  --  137  K 2.8*  --  3.9  CL 116*  --  110  CO2 16*  --  23  GLUCOSE 73  --  107*  BUN 18  --  24*  CREATININE 0.86  --  1.02  CALCIUM  7.1*  --  8.6*  MG  --  2.1  --    Liver Function Tests: Recent Labs  Lab 07/11/23 0821  AST 44*  ALT 27  ALKPHOS 60  BILITOT 0.6  PROT 6.4*  ALBUMIN 3.5   CBG: No results for input(s): GLUCAP in the last 168 hours.  Discharge time spent: greater than 30 minutes.  Signed: Delon Herald, MD [facility] 07/12/2023

## 2023-07-12 NOTE — Plan of Care (Signed)
   Problem: Activity: Goal: Risk for activity intolerance will decrease Outcome: Progressing   Problem: Nutrition: Goal: Adequate nutrition will be maintained Outcome: Progressing

## 2023-07-12 NOTE — Hospital Course (Signed)
 57yo with h/o bipolar d/o, HTN, and recurrent rhabdomyolysis who presented on 7/2 with myalgias.  Unremarkable evaluation and labs on presentation.  Monitored overnight.

## 2023-07-12 NOTE — TOC Initial Note (Signed)
 Transition of Care [facility]) - Initial/Assessment Note    Patient Details  Name: Dylan [name] MRN: [id number] Date of Birth: [birth year]  Transition of Care [facility]) CM/SW Contact:    Doneta Glenys DASEN, RN Phone Number: 07/12/2023, 5:07 PM  Clinical Narrative:                 CM spoke with patient in room. Patient states he is living at [facility] in [place], [place]. [place] spoke with Juliene Dorise [facility] [phone]) states patient can return. However, will need to provide own transportation. Patient states that [employer]([phone]) will provide transportation at discharge. PCP/insurance verified.TOC is following. [employer] called transportation set up with Gisele to pick up about 6:50 pm.    Expected Discharge Plan:  (Rehab) Barriers to Discharge: Barriers Resolved   Patient Goals and CMS Choice            Expected Discharge Plan and Services         Expected Discharge Date: 07/12/23                                    Prior Living Arrangements/Services                       Activities of Daily Living   ADL Screening (condition at time of admission) Independently performs ADLs?: Yes (appropriate for developmental age) Is the patient deaf or have difficulty hearing?: Yes Does the patient have difficulty seeing, even when wearing glasses/contacts?: Yes Does the patient have difficulty concentrating, remembering, or making decisions?: Yes  Permission Sought/Granted                  Emotional Assessment              Admission diagnosis:  Hypocalcemia [E83.51] Rhabdomyolysis [M62.82] Hypokalemia [E87.6] Patient Active Problem List   Diagnosis Date Noted   MDD (major depressive disorder), recurrent, severe, with psychosis (HCC) 03/12/2023   Transaminitis 01/04/2023   Bipolar 1 disorder, depressed (HCC) 12/05/2022   Non-traumatic rhabdomyolysis 12/04/2022   Weakness 11/03/2022   Cough 11/03/2022   Essential  hypertension 11/03/2022   Peripheral neuropathy 11/03/2022   Bipolar disorder (HCC) 08/27/2022   Cannabis use disorder 08/27/2022   Bipolar disorder, most recent episode depressed (HCC) 08/26/2022   Suicidal ideation 08/26/2022   Rhabdomyolysis 07/01/2022   Non-ST elevated myocardial infarction (HCC) 06/03/2022   Dyslipidemia 06/02/2022   Polysubstance abuse (HCC) 06/02/2022   Obesity (BMI 30-39.9) 06/02/2022   Alcohol use disorder 04/12/2022   PTSD (post-traumatic stress disorder) 04/12/2022   S/P total knee arthroplasty, left 12/14/2021   Total knee replacement status, left 12/14/2021   Chronic fatigue 11/16/2021   Abnormal EKG 11/16/2021   Overweight (BMI 25.0-29.9) 11/16/2021   Elevated LFTs 07/03/2021   Right foot ulcer (HCC) 07/03/2021   Onychomycosis 04/19/2021   Stimulant use disorder 03/16/2021   Nodular radiologic density 02/02/2021   Lesion of liver 02/02/2021   Liver disease 02/02/2021   Vitamin D  deficiency 01/18/2021   Methamphetamine abuse (HCC) 01/17/2021   Lesion of pancreas 01/17/2021   Substance induced mood disorder (HCC) 10/26/2020   Fatty liver 03/09/2020   Generalized anxiety disorder 02/03/2020   Lisfranc dislocation, right, initial encounter    Prediabetes 07/22/2019   Tobacco use disorder 07/10/2018   AKI (acute kidney injury) (HCC)    Chronic 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)    Acute hepatitis 02/19/2017   Degenerative disc disease 08/21/2011   HIV disease (HCC) 08/17/2011   PCP:  Delbert Clam, MD Pharmacy:   [facility] [phone] - [place], [place] - [address] [place] [address] Phone: [phone] Fax: [phone]     Social Drivers of Health (SDOH) Social History: SDOH Screenings   Food Insecurity: Food Insecurity Present (07/11/2023)  Housing: Low Risk  (07/11/2023)  Transportation Needs: Unmet Transportation Needs (07/11/2023)  Utilities: Not At Risk (07/11/2023)  Alcohol Screen: Low Risk   (03/10/2023)  Depression (PHQ2-9): Low Risk  (02/14/2023)  Financial Resource Strain: Medium Risk (06/24/2023)  Physical Activity: Insufficiently Active (06/24/2023)  Social Connections: Moderately Integrated (06/24/2023)  Stress: Stress Concern Present (06/24/2023)  Tobacco Use: Medium Risk (07/11/2023)   SDOH Interventions:     Readmission Risk Interventions    06/05/2023    3:36 PM 01/08/2023   12:34 PM  Readmission Risk Prevention Plan  Transportation Screening Complete   Medication Review (RN Care Manager) Complete   PCP or Specialist appointment within 3-5 days of discharge Complete   HRI or Home Care Consult Complete   SW Recovery Care/Counseling Consult    Palliative Care Screening Not Applicable   Skilled Nursing Facility Not Applicable      Information is confidential and restricted. Go to Review Flowsheets to unlock data.

## 2023-07-12 NOTE — TOC Transition Note (Signed)
 Transition of Care [facility]) - Discharge Note   Patient Details  Name: Dylan [name] MRN: [id number] Date of Birth: 01-20-1965  Transition [facility] Care [facility]) CM/SW Contact:  Doneta Glenys DASEN, RN Phone Number: 07/12/2023, 5:08 PM   Clinical Narrative:    See previous note     Barriers to Discharge: Barriers Resolved   Patient Goals and CMS Choice            Discharge Placement                       Discharge Plan and Services Additional resources added to the After Visit Summary for                                       Social Drivers of Health (SDOH) Interventions SDOH Screenings   Food Insecurity: Food Insecurity Present (07/11/2023)  Housing: Low Risk  (07/11/2023)  Transportation Needs: Unmet Transportation Needs (07/11/2023)  Utilities: Not At Risk (07/11/2023)  Alcohol Screen: Low Risk  (03/10/2023)  Depression (PHQ2-9): Low Risk  (02/14/2023)  Financial Resource Strain: Medium Risk (06/24/2023)  Physical Activity: Insufficiently Active (06/24/2023)  Social Connections: Moderately Integrated (06/24/2023)  Stress: Stress Concern Present (06/24/2023)  Tobacco Use: Medium Risk (07/11/2023)     Readmission Risk Interventions    06/05/2023    3:36 PM 01/08/2023   12:34 PM  Readmission Risk Prevention Plan  Transportation Screening Complete   Medication Review (RN Care Manager) Complete   PCP or Specialist appointment within 3-5 days of discharge Complete   HRI or Home Care Consult Complete   SW Recovery Care/Counseling Consult    Palliative Care Screening Not Applicable   Skilled Nursing Facility Not Applicable      Information is confidential and restricted. Go to Review Flowsheets to unlock data.

## 2023-07-12 NOTE — Plan of Care (Signed)
   Problem: Education: Goal: Knowledge of General Education information will improve Description Including pain rating scale, medication(s)/side effects and non-pharmacologic comfort measures Outcome: Progressing

## 2023-07-16 ENCOUNTER — Telehealth: Payer: Self-pay

## 2023-07-16 NOTE — Transitions of Care (Post Inpatient/ED Visit) (Signed)
   07/16/2023  Name: Dylan [name] MRN: [id number] DOB: [birth year]  Today's TOC FU Call Status: Today's TOC FU Call Status:: Unsuccessful Call (1st Attempt) Unsuccessful Call (1st Attempt) Date: 07/16/23  Attempted to reach the patient regarding the most recent Inpatient/ED visit.  Follow Up Plan: Additional outreach attempts will be made to reach the patient to complete the Transitions of Care (Post Inpatient/ED visit) call.   Signature  Slater Diesel, RN

## 2023-07-17 ENCOUNTER — Telehealth: Payer: Self-pay

## 2023-07-17 NOTE — Transitions of Care (Post Inpatient/ED Visit) (Signed)
   07/17/2023  Name: Dylan [name] MRN: [id number] DOB: 1965/08/19  Today's TOC FU Call Status: Today's TOC FU Call Status:: Unsuccessful Call (2nd Attempt) Unsuccessful Call (1st Attempt) Date: 07/16/23 Unsuccessful Call (2nd Attempt) Date: 07/17/23  Attempted to reach the patient regarding the most recent Inpatient/ED visit.  Follow Up Plan: Additional outreach attempts will be made to reach the patient to complete the Transitions of Care (Post Inpatient/ED visit) call.   Signature  Slater Diesel, RN

## 2023-07-18 ENCOUNTER — Telehealth: Payer: Self-pay

## 2023-07-18 NOTE — Transitions of Care (Post Inpatient/ED Visit) (Signed)
   07/18/2023  Name: Dylan [name] MRN: [id number] DOB: 05-Jul-1965  Today's TOC FU Call Status: Today's TOC FU Call Status:: Unsuccessful Call (3rd Attempt) Unsuccessful Call (1st Attempt) Date: 07/16/23 Unsuccessful Call (2nd Attempt) Date: 07/17/23 Unsuccessful Call (3rd Attempt) Date: 07/18/23  Attempted to reach the patient regarding the most recent Inpatient/ED visit.  Follow Up Plan: No further outreach attempts will be made at this time. We have been unable to contact the patient.  Letter sent to patient requesting he call [facility] to schedule a follow up appointment as we have not been able to reach him  Signature  Slater Diesel, RN

## 2023-07-23 ENCOUNTER — Other Ambulatory Visit: Payer: Self-pay

## 2023-07-23 ENCOUNTER — Ambulatory Visit: Payer: MEDICAID | Admitting: Internal Medicine

## 2023-07-23 ENCOUNTER — Encounter: Payer: Self-pay | Admitting: Internal Medicine

## 2023-07-23 ENCOUNTER — Encounter (HOSPITAL_BASED_OUTPATIENT_CLINIC_OR_DEPARTMENT_OTHER): Payer: Self-pay | Admitting: General Surgery

## 2023-07-23 ENCOUNTER — Other Ambulatory Visit (HOSPITAL_COMMUNITY)
Admission: RE | Admit: 2023-07-23 | Discharge: 2023-07-23 | Disposition: A | Discharge status: Home / Self Care | Payer: MEDICAID | Source: Ambulatory Visit | Attending: Internal Medicine | Admitting: Internal Medicine

## 2023-07-23 VITALS — BP 133/85 | HR 60 | Temp 97.6°F | Ht 74.0 in | Wt 241.0 lb

## 2023-07-23 DIAGNOSIS — B20 Human immunodeficiency virus [HIV] disease: Secondary | ICD-10-CM | POA: Insufficient documentation

## 2023-07-23 DIAGNOSIS — E782 Mixed hyperlipidemia: Secondary | ICD-10-CM

## 2023-07-23 DIAGNOSIS — K5900 Constipation, unspecified: Secondary | ICD-10-CM

## 2023-07-23 DIAGNOSIS — M6282 Rhabdomyolysis: Secondary | ICD-10-CM | POA: Diagnosis not present

## 2023-07-23 DIAGNOSIS — R85612 Low grade squamous intraepithelial lesion on cytologic smear of anus (LGSIL): Secondary | ICD-10-CM

## 2023-07-23 MED ORDER — BICTEGRAVIR-EMTRICITAB-TENOFOV 50-200-25 MG PO TABS: 1.0000 | ORAL_TABLET | Freq: Every day | ORAL | 11 refills | Status: DC

## 2023-07-23 MED ORDER — ROSUVASTATIN CALCIUM 20 MG PO TABS: 20.0000 mg | ORAL_TABLET | Freq: Every day | ORAL | 3 refills | Status: DC

## 2023-07-23 NOTE — Progress Notes (Unsigned)
 RFV: follow up for hiv disease  Patient ID: Dylan [name], male   DOB: [birth year], 58 y.o.   MRN: [id number]  HPI Dylan [name] is a 58yo M with hiv disease, currently on biktarvy . 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 He had hospitalization at end of may, and early July.  Only off of biktarvy  2 days due to issues pharmacy  Not sexually active.   Has atypical anal pap -- going to general surgery on Friday for HRA  Outpatient Encounter Medications as of 07/23/2023  Medication Sig   bictegravir-emtricitabine -tenofovir  AF (BIKTARVY ) 50-200-25 MG TABS tablet Take 1 tablet by mouth daily.   diclofenac  Sodium (VOLTAREN ) 1 % GEL Apply 4 g topically 4 (four) times daily.   gabapentin  (NEURONTIN ) 300 MG capsule Take 2 capsules (600 mg total) by mouth 2 (two) times daily.   traZODone  (DESYREL ) 150 MG tablet Take 150 mg by mouth at bedtime.   [Paused] rosuvastatin  (CRESTOR ) 20 MG tablet Take 1 tablet (20 mg total) by mouth daily. (Patient not taking: Reported on 07/23/2023)   No facility-administered encounter medications on file as of 07/23/2023.     Patient Active Problem List   Diagnosis Date Noted   MDD (major depressive disorder), recurrent, severe, with psychosis (HCC) 03/12/2023   Transaminitis 01/04/2023   Bipolar 1 disorder, depressed (HCC) 12/05/2022   Non-traumatic rhabdomyolysis 12/04/2022   Weakness 11/03/2022   Cough 11/03/2022   Essential hypertension 11/03/2022   Peripheral neuropathy 11/03/2022   Bipolar disorder (HCC) 08/27/2022   Cannabis use disorder 08/27/2022   Bipolar disorder, most recent episode depressed (HCC) 08/26/2022   Suicidal ideation 08/26/2022   Rhabdomyolysis 07/01/2022   Non-ST elevated myocardial infarction (HCC) 06/03/2022   Dyslipidemia 06/02/2022   Polysubstance abuse (HCC) 06/02/2022   Obesity (BMI 30-39.9) 06/02/2022   Alcohol use disorder  04/12/2022   PTSD (post-traumatic stress disorder) 04/12/2022   S/P total knee arthroplasty, left 12/14/2021   Total knee replacement status, left 12/14/2021   Chronic fatigue 11/16/2021   Abnormal EKG 11/16/2021   Overweight (BMI 25.0-29.9) 11/16/2021   Elevated LFTs 07/03/2021   Right foot ulcer (HCC) 07/03/2021   Onychomycosis 04/19/2021   Stimulant use disorder 03/16/2021   Nodular radiologic density 02/02/2021   Lesion of liver 02/02/2021   Liver disease 02/02/2021   Vitamin D  deficiency 01/18/2021   Methamphetamine abuse (HCC) 01/17/2021   Lesion of pancreas 01/17/2021   Substance induced mood disorder (HCC) 10/26/2020   Fatty liver 03/09/2020   Generalized anxiety disorder 02/03/2020   Lisfranc dislocation, right, initial encounter    Prediabetes 07/22/2019   Tobacco use disorder 07/10/2018   AKI (acute kidney injury) (HCC)    Chronic 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 coma (HCC)    Acute hepatitis 02/19/2017   Degenerative disc disease 08/21/2011   HIV disease (HCC) 08/17/2011     Health Maintenance Due  Topic Date Due   Hepatitis B Vaccines (1 of 3 - 19+ 3-dose series) 08/01/1984   Zoster Vaccines- Shingrix (1 of 2) Never done   Pneumococcal Vaccine 59-37 Years old (4 of 4 - PCV20 or PCV21) 06/20/2022   COVID-19 Vaccine (5 - 2024-25 season) 09/10/2022     Review of Systems Review of Systems  Constitutional: Negative for fever, chills, diaphoresis, activity change, appetite change, fatigue and unexpected weight change.  HENT: Negative for congestion, sore throat, rhinorrhea, sneezing, trouble swallowing and sinus pressure.  Eyes:  Negative for photophobia and visual disturbance.  Respiratory: Negative for cough, chest tightness, shortness of breath, wheezing and stridor.  Cardiovascular: Negative for chest pain, palpitations and leg swelling.  Gastrointestinal: Negative for nausea, vomiting, abdominal pain, diarrhea, constipation, blood in stool,  abdominal distention and anal bleeding.  Genitourinary: Negative for dysuria, hematuria, flank pain and difficulty urinating.  Musculoskeletal: Negative for myalgias, back pain, joint swelling, arthralgias and gait problem.  Skin: Negative for color change, pallor, rash and wound.  Neurological: Negative for dizziness, tremors, weakness and light-headedness.  Hematological: Negative for adenopathy. Does not bruise/bleed easily.  Psychiatric/Behavioral: Negative for behavioral problems, confusion, sleep disturbance, dysphoric mood, decreased concentration and agitation.   Physical Exam   BP 133/85   Pulse 60   Temp 97.6 F (36.4 C) (Temporal)   Ht 6' 2 (1.88 m)   Wt 241 lb (109.3 kg)   SpO2 98%   BMI 30.94 kg/m   Physical Exam  Constitutional: He is oriented to person, place, and time. He appears well-developed and well-nourished. No distress.  HENT:  Mouth/Throat: Oropharynx is clear and moist. No oropharyngeal exudate.  Cardiovascular: Normal rate, regular rhythm and normal heart sounds. Exam reveals no gallop and no friction rub.  No murmur heard.  Pulmonary/Chest: Effort normal and breath sounds normal. No respiratory distress. He has no wheezes.  Abdominal: Soft. Bowel sounds are normal. He exhibits no distension. There is no tenderness.  Lymphadenopathy:  He has no cervical adenopathy.  Neurological: He is alert and oriented to person, place, and time.  Skin: Skin is warm and dry. No rash noted. No erythema.  Psychiatric: He has a normal mood and affect. His behavior is normal.   Lab Results  Component Value Date   CD4TCELL 34 02/14/2023   Lab Results  Component Value Date   CD4TABS 749 02/14/2023   CD4TABS 716 01/04/2023   CD4TABS 638 11/08/2021   Lab Results  Component Value Date   HIV1RNAQUANT 48 (H) 02/14/2023   Lab Results  Component Value Date   HEPBSAB NON-REACTIVE 07/24/2017   Lab Results  Component Value Date   LABRPR NON-REACTIVE 02/14/2023     CBC Lab Results  Component Value Date   WBC 5.2 07/12/2023   RBC 4.27 07/12/2023   HGB 13.0 07/12/2023   HCT 39.5 07/12/2023   PLT 154 07/12/2023   MCV 92.5 07/12/2023   MCH 30.4 07/12/2023   MCHC 32.9 07/12/2023   RDW 13.9 07/12/2023   LYMPHSABS 2.5 07/11/2023   MONOABS 1.1 (H) 07/11/2023   EOSABS 0.0 07/11/2023    BMET Lab Results  Component Value Date   NA 137 07/12/2023   K 3.9 07/12/2023   CL 110 07/12/2023   CO2 23 07/12/2023   GLUCOSE 107 (H) 07/12/2023   BUN 24 (H) 07/12/2023   CREATININE 1.02 07/12/2023   CALCIUM  8.6 (L) 07/12/2023   GFRNONAA >60 07/12/2023   GFRAA 92 07/06/2020      Assessment and Plan  Non traumatic rhabdo = Will check ck to see he is back to baseline  Hiv disease= will reorder biktarvy  x 1 month locally then the rest at [facility]; check labs  Health maintenance =Wants to restart cholesterol   Atypical pap = has upcoming HRA with [facility]  Constipation = recommend mutamucil, and miralax 

## 2023-07-23 NOTE — Patient Instructions (Signed)
 Smoking Cessation: QuitlineNC 1-800-QUIT-NOW 707-701-6721); Espaol: 1-855-Djelo-Ya (1-780-445-4976) http://carroll-[email or web address].[email or web address]

## 2023-07-24 ENCOUNTER — Other Ambulatory Visit: Payer: Self-pay | Admitting: Medical Genetics

## 2023-07-24 LAB — T-HELPER CELL (CD4) - (RCID CLINIC ONLY)
CD4 % Helper T Cell: 35 % (ref 33–65)
CD4 T Cell Abs: 704 /uL (ref 400–1790)

## 2023-07-24 LAB — URINE CYTOLOGY ANCILLARY ONLY
Chlamydia: NEGATIVE
Comment: NEGATIVE
Comment: NORMAL
Neisseria Gonorrhea: NEGATIVE

## 2023-07-25 ENCOUNTER — Ambulatory Visit: Payer: MEDICAID | Admitting: Family Medicine

## 2023-07-25 LAB — CBC WITH DIFFERENTIAL/PLATELET
Absolute Lymphocytes: 2336 {cells}/uL (ref 850–3900)
Absolute Monocytes: 478 {cells}/uL (ref 200–950)
Basophils Absolute: 30 {cells}/uL (ref 0–200)
Basophils Relative: 0.5 %
Eosinophils Absolute: 372 {cells}/uL (ref 15–500)
Eosinophils Relative: 6.3 %
HCT: 46 % (ref 38.5–50.0)
Hemoglobin: 15.2 g/dL (ref 13.2–17.1)
MCH: 30.1 pg (ref 27.0–33.0)
MCHC: 33 g/dL (ref 32.0–36.0)
MCV: 91.1 fL (ref 80.0–100.0)
MPV: 11.4 fL (ref 7.5–12.5)
Monocytes Relative: 8.1 %
Neutro Abs: 2685 {cells}/uL (ref 1500–7800)
Neutrophils Relative %: 45.5 %
Platelets: 197 Thousand/uL (ref 140–400)
RBC: 5.05 Million/uL (ref 4.20–5.80)
RDW: 13.4 % (ref 11.0–15.0)
Total Lymphocyte: 39.6 %
WBC: 5.9 Thousand/uL (ref 3.8–10.8)

## 2023-07-25 LAB — COMPLETE METABOLIC PANEL WITHOUT GFR
AG Ratio: 1.4 (calc) (ref 1.0–2.5)
ALT: 28 U/L (ref 9–46)
AST: 23 U/L (ref 10–35)
Albumin: 4.5 g/dL (ref 3.6–5.1)
Alkaline phosphatase (APISO): 87 U/L (ref 35–144)
BUN: 15 mg/dL (ref 7–25)
CO2: 27 mmol/L (ref 20–32)
Calcium: 10.1 mg/dL (ref 8.6–10.3)
Chloride: 103 mmol/L (ref 98–110)
Creat: 1.11 mg/dL (ref 0.70–1.30)
Globulin: 3.3 g/dL (ref 1.9–3.7)
Glucose, Bld: 88 mg/dL (ref 65–99)
Potassium: 4.4 mmol/L (ref 3.5–5.3)
Sodium: 137 mmol/L (ref 135–146)
Total Bilirubin: 0.3 mg/dL (ref 0.2–1.2)
Total Protein: 7.8 g/dL (ref 6.1–8.1)

## 2023-07-25 LAB — HIV-1 RNA QUANT-NO REFLEX-BLD
HIV 1 RNA Quant: <20 {copies}/mL — AB
HIV-1 RNA Quant, Log: <1.3 {Log_copies}/mL — AB

## 2023-07-25 LAB — RPR: RPR Ser Ql: NONREACTIVE

## 2023-07-25 LAB — CK: Total CK: 617 U/L — ABNORMAL HIGH (ref 23–325)

## 2023-07-26 NOTE — H&P (Signed)
 REFERRING PHYSICIAN:  Luiz Channel, MD   PROVIDER:  BERNARDA WANDA NED, MD   MRN: [id number] DOB: 11/22/1965 DATE OF ENCOUNTER: 05/07/2023   Subjective    Chief Complaint: New Consultation       History of Present Illness: Dylan [name] is a 58 y.o. male who is seen today as an office consultation at the request of Dr. Luiz for evaluation of New Consultation .  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  He does complain of a small growth on the left side of his anus.     Review of Systems: A complete review of systems was obtained from the patient.  I have reviewed this information and discussed as appropriate with the patient.  See HPI as well for other ROS.       Medical History:     Past Medical History:  Diagnosis Date   Anxiety     Arthritis     GERD (gastroesophageal reflux disease)     HIV -AIDS with opportunistic infection, Symptomatic (CMS/HHS-HCC)     Liver disease        There is no problem list on file for this patient.          Past Surgical History:  Procedure Laterality Date   JOINT REPLACEMENT          No Known Allergies         Current Outpatient Medications on File Prior to Visit  Medication Sig Dispense Refill   BIKTARVY  50-200-25 mg tablet Take 1 tablet by mouth once daily       gabapentin  (NEURONTIN ) 100 MG capsule Take 100 mg by mouth 3 (three) times daily        No current facility-administered medications on file prior to visit.           Family History  Problem Relation Age of Onset   Diabetes Father     Breast cancer Sister        Social History        Tobacco Use  Smoking Status Every Day   Types: Cigarettes  Smokeless Tobacco Never      Social History         Socioeconomic History   Marital status: Married  Tobacco Use   Smoking status: Every Day      Types: Cigarettes   Smokeless tobacco: Never  Vaping Use   Vaping  status: Never Used  Substance and Sexual Activity   Alcohol use: Yes   Drug use: Yes      Types: Other-see comments      Comment: cocain    Social Drivers of Health        Food Insecurity: No Food Insecurity (03/10/2023)    Received from [facility]    Hunger Vital Sign     Worried About Running Out of Food in the Last Year: Never true     Ran Out of Food in the Last Year: Never true  Transportation Needs: Unmet Transportation Needs (03/10/2023)    Received from [facility] - Transportation     Lack of Transportation (Medical): Yes     Lack of Transportation (Non-Medical): Yes  Housing Stability: Low Risk  (03/10/2023)    Received from [facility] Stability Vital Sign     Unable to Pay for Housing in the Last Year: No  Number of Times Moved in the Last Year: 0     Homeless in the Last Year: No      Objective:          Exam Gen: NAD Abd: soft     Labs, Imaging and Diagnostic Testing: HIV 48 CD4 749   Assessment and Plan:  Diagnoses and all orders for this visit:   LGSIL Pap smear of anus     I have recommended proceeding with high-resolution anoscopy with possible biopsy or ablation.  We discussed this in detail.  If the lesion on his anal canal, is amenable to ablation or resection we will remove that at the same time.   Bernarda JAYSON Ned, MD Colon and Rectal Surgery [facility]

## 2023-07-27 ENCOUNTER — Encounter (HOSPITAL_BASED_OUTPATIENT_CLINIC_OR_DEPARTMENT_OTHER)
Admission: RE | Disposition: A | Discharge status: Home / Self Care | Payer: Self-pay | Source: Home / Self Care | Attending: General Surgery

## 2023-07-27 ENCOUNTER — Ambulatory Visit (HOSPITAL_BASED_OUTPATIENT_CLINIC_OR_DEPARTMENT_OTHER): Payer: MEDICAID | Admitting: Anesthesiology

## 2023-07-27 ENCOUNTER — Encounter (HOSPITAL_BASED_OUTPATIENT_CLINIC_OR_DEPARTMENT_OTHER): Payer: Self-pay | Admitting: General Surgery

## 2023-07-27 ENCOUNTER — Other Ambulatory Visit: Payer: Self-pay

## 2023-07-27 ENCOUNTER — Ambulatory Visit (HOSPITAL_COMMUNITY)
Admission: RE | Admit: 2023-07-27 | Discharge: 2023-07-27 | Disposition: A | Discharge status: Home / Self Care | Payer: MEDICAID | Attending: General Surgery | Admitting: General Surgery

## 2023-07-27 DIAGNOSIS — I10 Essential (primary) hypertension: Secondary | ICD-10-CM | POA: Insufficient documentation

## 2023-07-27 DIAGNOSIS — F419 Anxiety disorder, unspecified: Secondary | ICD-10-CM | POA: Diagnosis not present

## 2023-07-27 DIAGNOSIS — K6289 Other specified diseases of anus and rectum: Secondary | ICD-10-CM | POA: Diagnosis not present

## 2023-07-27 DIAGNOSIS — A630 Anogenital (venereal) warts: Secondary | ICD-10-CM

## 2023-07-27 DIAGNOSIS — K219 Gastro-esophageal reflux disease without esophagitis: Secondary | ICD-10-CM | POA: Insufficient documentation

## 2023-07-27 DIAGNOSIS — Z79899 Other long term (current) drug therapy: Secondary | ICD-10-CM | POA: Diagnosis not present

## 2023-07-27 DIAGNOSIS — M1990 Unspecified osteoarthritis, unspecified site: Secondary | ICD-10-CM | POA: Insufficient documentation

## 2023-07-27 DIAGNOSIS — K759 Inflammatory liver disease, unspecified: Secondary | ICD-10-CM | POA: Diagnosis not present

## 2023-07-27 DIAGNOSIS — K603 Anal fistula, unspecified: Secondary | ICD-10-CM | POA: Insufficient documentation

## 2023-07-27 DIAGNOSIS — F17210 Nicotine dependence, cigarettes, uncomplicated: Secondary | ICD-10-CM

## 2023-07-27 DIAGNOSIS — F319 Bipolar disorder, unspecified: Secondary | ICD-10-CM | POA: Diagnosis not present

## 2023-07-27 DIAGNOSIS — Z21 Asymptomatic human immunodeficiency virus [HIV] infection status: Secondary | ICD-10-CM | POA: Insufficient documentation

## 2023-07-27 DIAGNOSIS — D013 Carcinoma in situ of anus and anal canal: Secondary | ICD-10-CM | POA: Insufficient documentation

## 2023-07-27 DIAGNOSIS — Z01818 Encounter for other preprocedural examination: Secondary | ICD-10-CM

## 2023-07-27 DIAGNOSIS — R85612 Low grade squamous intraepithelial lesion on cytologic smear of anus (LGSIL): Secondary | ICD-10-CM | POA: Diagnosis present

## 2023-07-27 HISTORY — PX: HIGH RESOLUTION ANOSCOPY: SHX6345

## 2023-07-27 HISTORY — PX: RECTAL BIOPSY: SHX2303

## 2023-07-27 HISTORY — PX: ANAL FISTULOTOMY: SHX6423

## 2023-07-27 SURGERY — ANOSCOPY, HIGH RESOLUTION
Anesthesia: Monitor Anesthesia Care | Site: Rectum

## 2023-07-27 MED ORDER — OXYCODONE HCL 5 MG PO TABS: 5.0000 mg | ORAL_TABLET | Freq: Once | ORAL | Status: DC | PRN

## 2023-07-27 MED ORDER — KETOROLAC TROMETHAMINE 30 MG/ML IJ SOLN
INTRAMUSCULAR | Status: DC | PRN
  Administered 2023-07-27: 30 mg via INTRAVENOUS

## 2023-07-27 MED ORDER — FENTANYL CITRATE (PF) 100 MCG/2ML IJ SOLN
INTRAMUSCULAR | Status: DC | PRN
  Administered 2023-07-27 (×2): 50 ug via INTRAVENOUS

## 2023-07-27 MED ORDER — FENTANYL CITRATE (PF) 100 MCG/2ML IJ SOLN: 25.0000 ug | INTRAMUSCULAR | Status: DC | PRN

## 2023-07-27 MED ORDER — LIDOCAINE HCL (CARDIAC) PF 100 MG/5ML IV SOSY
PREFILLED_SYRINGE | INTRAVENOUS | Status: DC | PRN
  Administered 2023-07-27: 50 mg via INTRAVENOUS

## 2023-07-27 MED ORDER — OXYCODONE HCL 5 MG/5ML PO SOLN: 5.0000 mg | Freq: Once | ORAL | Status: DC | PRN

## 2023-07-27 MED ORDER — BUPIVACAINE-EPINEPHRINE 0.5% -1:200000 IJ SOLN
INTRAMUSCULAR | Status: DC | PRN
  Administered 2023-07-27: 30 mL

## 2023-07-27 MED ORDER — PROPOFOL 500 MG/50ML IV EMUL
INTRAVENOUS | Status: DC | PRN
  Administered 2023-07-27: 150 ug/kg/min via INTRAVENOUS

## 2023-07-27 MED ORDER — DEXAMETHASONE SODIUM PHOSPHATE 10 MG/ML IJ SOLN
INTRAMUSCULAR | Status: AC
  Filled 2023-07-27: qty 1

## 2023-07-27 MED ORDER — ACETAMINOPHEN 500 MG PO TABS
1000.0000 mg | ORAL_TABLET | ORAL | Status: AC
  Administered 2023-07-27: 1000 mg via ORAL

## 2023-07-27 MED ORDER — MIDAZOLAM HCL 5 MG/5ML IJ SOLN
INTRAMUSCULAR | Status: DC | PRN
  Administered 2023-07-27: 2 mg via INTRAVENOUS

## 2023-07-27 MED ORDER — 0.9 % SODIUM CHLORIDE (POUR BTL) OPTIME
TOPICAL | Status: DC | PRN
Start: 2023-07-27 — End: 2023-07-27
  Administered 2023-07-27: 1000 mL

## 2023-07-27 MED ORDER — DEXMEDETOMIDINE HCL IN NACL 80 MCG/20ML IV SOLN
INTRAVENOUS | Status: AC
  Filled 2023-07-27: qty 20

## 2023-07-27 MED ORDER — LACTATED RINGERS IV SOLN: INTRAVENOUS | Status: DC

## 2023-07-27 MED ORDER — SODIUM CHLORIDE 0.9% FLUSH: 3.0000 mL | Freq: Two times a day (BID) | INTRAVENOUS | Status: DC

## 2023-07-27 MED ORDER — DEXMEDETOMIDINE HCL IN NACL 80 MCG/20ML IV SOLN
INTRAVENOUS | Status: DC | PRN
Start: 2023-07-27 — End: 2023-07-27
  Administered 2023-07-27: 8 ug via INTRAVENOUS

## 2023-07-27 MED ORDER — ONDANSETRON HCL 4 MG/2ML IJ SOLN
INTRAMUSCULAR | Status: AC
  Filled 2023-07-27: qty 2

## 2023-07-27 MED ORDER — PROPOFOL 10 MG/ML IV BOLUS
INTRAVENOUS | Status: DC | PRN
  Administered 2023-07-27: 20 mg via INTRAVENOUS

## 2023-07-27 MED ORDER — DEXAMETHASONE SODIUM PHOSPHATE 4 MG/ML IJ SOLN
INTRAMUSCULAR | Status: DC | PRN
  Administered 2023-07-27: 4 mg via INTRAVENOUS
  Administered 2023-07-27: 8 mg via INTRAVENOUS

## 2023-07-27 MED ORDER — TRAMADOL HCL 50 MG PO TABS: 50.0000 mg | ORAL_TABLET | Freq: Four times a day (QID) | ORAL | 0 refills | Status: DC | PRN

## 2023-07-27 MED ORDER — LIDOCAINE 2% (20 MG/ML) 5 ML SYRINGE
INTRAMUSCULAR | Status: AC
  Filled 2023-07-27: qty 5

## 2023-07-27 MED ORDER — ONDANSETRON HCL 4 MG/2ML IJ SOLN: 4.0000 mg | Freq: Four times a day (QID) | INTRAMUSCULAR | Status: DC | PRN

## 2023-07-27 MED ORDER — ACETIC ACID 5 % SOLN
Status: DC | PRN
  Administered 2023-07-27: 1 via TOPICAL

## 2023-07-27 MED ORDER — KETOROLAC TROMETHAMINE 30 MG/ML IJ SOLN
INTRAMUSCULAR | Status: AC
  Filled 2023-07-27: qty 1

## 2023-07-27 MED ORDER — MIDAZOLAM HCL 2 MG/2ML IJ SOLN
INTRAMUSCULAR | Status: AC
  Filled 2023-07-27: qty 2

## 2023-07-27 MED ORDER — ONDANSETRON HCL 4 MG/2ML IJ SOLN
INTRAMUSCULAR | Status: DC | PRN
  Administered 2023-07-27: 4 mg via INTRAVENOUS

## 2023-07-27 MED ORDER — ACETAMINOPHEN 500 MG PO TABS
ORAL_TABLET | ORAL | Status: AC
  Filled 2023-07-27: qty 2

## 2023-07-27 MED ORDER — FENTANYL CITRATE (PF) 100 MCG/2ML IJ SOLN
INTRAMUSCULAR | Status: AC
  Filled 2023-07-27: qty 2

## 2023-07-27 MED ORDER — GLYCOPYRROLATE 0.2 MG/ML IJ SOLN
INTRAMUSCULAR | Status: DC | PRN
  Administered 2023-07-27: .1 mg via INTRAVENOUS

## 2023-07-27 SURGICAL SUPPLY — 50 items
BENZOIN TINCTURE PRP APPL 2/3 (GAUZE/BANDAGES/DRESSINGS) ×4 IMPLANT
BLADE EXTENDED COATED 6.5IN (ELECTRODE) IMPLANT
BLADE SURG 10 STRL SS (BLADE) IMPLANT
BRIEF MESH DISP 2XL (UNDERPADS AND DIAPERS) ×2 IMPLANT
COVER BACK TABLE 60X90IN (DRAPES) ×2 IMPLANT
COVER MAYO STAND STRL (DRAPES) ×2 IMPLANT
DRAPE HYSTEROSCOPY (MISCELLANEOUS) IMPLANT
DRAPE LAPAROTOMY 100X72 PEDS (DRAPES) ×2 IMPLANT
DRAPE UTILITY XL STRL (DRAPES) ×2 IMPLANT
ELECTRODE REM PT RTRN 9FT ADLT (ELECTROSURGICAL) ×2 IMPLANT
GAUZE 4X4 16PLY ~~LOC~~+RFID DBL (SPONGE) ×2 IMPLANT
GAUZE PAD ABD 8X10 STRL (GAUZE/BANDAGES/DRESSINGS) ×2 IMPLANT
GAUZE SPONGE 4X4 12PLY STRL (GAUZE/BANDAGES/DRESSINGS) IMPLANT
GLOVE BIO SURGEON STRL SZ 6.5 (GLOVE) ×2 IMPLANT
GLOVE INDICATOR 6.5 STRL GRN (GLOVE) ×2 IMPLANT
GOWN STRL REUS W/TWL XL LVL3 (GOWN DISPOSABLE) ×2 IMPLANT
HYDROGEN PEROXIDE 16OZ (MISCELLANEOUS) IMPLANT
IV CATH 14GX2 1/4 (CATHETERS) IMPLANT
IV CATH 18G SAFETY (IV SOLUTION) IMPLANT
KIT SIGMOIDOSCOPE (SET/KITS/TRAYS/PACK) IMPLANT
KIT TURNOVER KIT B (KITS) ×2 IMPLANT
LEGGING LITHOTOMY PAIR STRL (DRAPES) IMPLANT
LOOP VASCLR MAXI BLUE 18IN ST (MISCELLANEOUS) IMPLANT
LOOPS VASCLR MAXI BLUE 18IN ST (MISCELLANEOUS) IMPLANT
NDL BIOPSY 14X6 SOFT TISS (NEEDLE) IMPLANT
NDL HYPO 22X1.5 SAFETY MO (MISCELLANEOUS) ×2 IMPLANT
NEEDLE BIOPSY 14X6 SOFT TISS (NEEDLE) IMPLANT
NEEDLE HYPO 22X1.5 SAFETY MO (MISCELLANEOUS) ×2 IMPLANT
NS IRRIG 1000ML POUR BTL (IV SOLUTION) ×2 IMPLANT
PACK BASIN DAY SURGERY FS (CUSTOM PROCEDURE TRAY) ×2 IMPLANT
PAD ARMBOARD POSITIONER FOAM (MISCELLANEOUS) IMPLANT
PENCIL SMOKE EVACUATOR (MISCELLANEOUS) ×2 IMPLANT
SHEET MEDIUM DRAPE 40X70 STRL (DRAPES) IMPLANT
SLEEVE SCD COMPRESS KNEE MED (STOCKING) ×2 IMPLANT
SPIKE FLUID TRANSFER (MISCELLANEOUS) ×2 IMPLANT
SPONGE HEMORRHOID 8X3CM (HEMOSTASIS) IMPLANT
SPONGE SURGIFOAM ABS GEL 12-7 (HEMOSTASIS) IMPLANT
SUCTION TUBE FRAZIER 10FR DISP (SUCTIONS) IMPLANT
SUT CHROMIC 2 0 SH (SUTURE) IMPLANT
SUT CHROMIC 3 0 SH 27 (SUTURE) IMPLANT
SUT ETHIBOND 0 (SUTURE) IMPLANT
SUT VIC AB 2-0 SH 27XBRD (SUTURE) IMPLANT
SUT VIC AB 3-0 SH 18 (SUTURE) IMPLANT
SUT VIC AB 3-0 SH 27XBRD (SUTURE) IMPLANT
SYR BULB IRRIG 60ML STRL (SYRINGE) ×2 IMPLANT
SYR CONTROL 10ML LL (SYRINGE) ×2 IMPLANT
TOWEL GREEN STERILE FF (TOWEL DISPOSABLE) ×2 IMPLANT
TRAY DSU PREP LF (CUSTOM PROCEDURE TRAY) ×2 IMPLANT
TUBE CONNECTING 20X1/4 (TUBING) ×2 IMPLANT
YANKAUER SUCT BULB TIP NO VENT (SUCTIONS) ×2 IMPLANT

## 2023-07-27 NOTE — Transfer of Care (Signed)
 Immediate Anesthesia Transfer of Care Note  Patient: Dylan [name]  Procedure(s) Performed: ANOSCOPY, HIGH RESOLUTION (Rectum) BIOPSY AND ABLATION, RECTUM (Rectum) ANAL FISTULOTOMY (Rectum)  Patient Location: PACU  Anesthesia Type:General  Level of Consciousness: awake, alert , and oriented  Airway & Oxygen Therapy: Patient Spontanous Breathing and Patient connected to face mask oxygen  Post-op Assessment: Report given to RN and Post -op Vital signs reviewed and stable  Post vital signs: Reviewed and stable  Last Vitals:  Vitals Value Taken Time  BP 105/59 07/27/23 08:17  Temp 36.2 C 07/27/23 08:18  Pulse 78 07/27/23 08:19  Resp 13 07/27/23 08:19  SpO2 96 % 07/27/23 08:19  Vitals shown include unfiled device data.  Last Pain:  Vitals:   07/27/23 0629  TempSrc: Temporal  PainSc: 0-No pain      Patients Stated Pain Goal: 3 (07/27/23 9370)  Complications: No notable events documented.

## 2023-07-27 NOTE — Anesthesia Procedure Notes (Signed)
 Procedure Name: MAC Date/Time: 07/27/2023 7:34 AM  Performed by: Buster Catheryn SAUNDERS, CRNAPre-anesthesia Checklist: Patient identified, Emergency Drugs available, Suction available, Patient being monitored and Timeout performed Patient Re-evaluated:Patient Re-evaluated prior to induction Oxygen Delivery Method: Simple face mask Placement Confirmation: positive ETCO2

## 2023-07-27 NOTE — Anesthesia Postprocedure Evaluation (Signed)
 Anesthesia Post Note  Patient: Brayam Boeke  Procedure(s) Performed: ANOSCOPY, HIGH RESOLUTION (Rectum) BIOPSY AND ABLATION, RECTUM (Rectum) ANAL FISTULOTOMY (Rectum)     Patient location during evaluation: PACU Anesthesia Type: MAC Level of consciousness: awake and alert Pain management: pain level controlled Vital Signs Assessment: post-procedure vital signs reviewed and stable Respiratory status: spontaneous breathing, nonlabored ventilation, respiratory function stable and patient connected to nasal cannula oxygen Cardiovascular status: stable and blood pressure returned to baseline Postop Assessment: no apparent nausea or vomiting Anesthetic complications: no   No notable events documented.  Last Vitals:  Vitals:   07/27/23 0843 07/27/23 0915  BP: 119/82 (!) 134/96  Pulse: 68 69  Resp: 12 14  Temp:  (!) 36.3 C  SpO2: 94% 97%    Last Pain:  Vitals:   07/27/23 0915  TempSrc:   PainSc: 0-No pain                 Ivylynn Hoppes S

## 2023-07-27 NOTE — Discharge Instructions (Addendum)
 ANORECTAL SURGERY: POST OP INSTRUCTIONS Take your usually prescribed home medications unless otherwise directed. DIET: During the first few hours after surgery sip on some liquids until you are able to urinate.  It is normal to not urinate for several hours after this surgery.  If you feel uncomfortable, please contact the office for instructions.  After you are able to urinate,you may eat, if you feel like it.  Follow a light bland diet the first 24 hours after arrival home, such as soup, liquids, crackers, etc.  Be sure to include lots of fluids daily (6-8 glasses).  Avoid fast food or heavy meals, as your are more likely to get nauseated.  Eat a low fat diet the next few days after surgery.  Limit caffeine intake to 1-2 servings a day. PAIN CONTROL: Pain is best controlled by a usual combination of several different methods TOGETHER: Muscle relaxation: Soak in a warm bath (or Sitz bath) three times a day and after bowel movements.  Continue to do this until all pain is resolved. Over the counter pain medication Prescription pain medication Most patients will experience some swelling and discomfort in the anus/rectal area and incisions.  Heat such as warm towels, sitz baths, warm baths, etc to help relax tight/sore spots and speed recovery.  Some people prefer to use ice, especially in the first couple days after surgery, as it may decrease the pain and swelling, or alternate between ice & heat.  Experiment to what works for you.  Swelling and bruising can take several weeks to resolve.  Pain can take even longer to completely resolve. It is helpful to take an over-the-counter pain medication regularly for the first few weeks.  Choose one of the following that works best for you: Naproxen  (Aleve , etc)  Two 220mg  tabs twice a day Ibuprofen  (Advil , etc) Three 200mg  tabs four times a day (every meal & bedtime) A  prescription for pain medication (such as percocet, oxycodone , hydrocodone , etc) should be  given to you upon discharge.  Take your pain medication as prescribed.  If you are having problems/concerns with the prescription medicine (does not control pain, nausea, vomiting, rash, itching, etc), please call us  [phone] to see if we need to switch you to a different pain medicine that will work better for you and/or control your side effect better. If you need a refill on your pain medication, please contact your pharmacy.  They will contact our office to request authorization. Prescriptions will not be filled after 5 pm or on week-ends. KEEP YOUR BOWELS REGULAR and AVOID CONSTIPATION The goal is one to two soft bowel movements a day.  You should at least have a bowel movement every other day. Avoid getting constipated.  Between the surgery and the pain medications, it is common to experience some constipation. This can be very painful after rectal surgery.  Increasing fluid intake and taking a fiber supplement (such as Metamucil, Citrucel, FiberCon, etc) 1-2 times a day regularly will usually help prevent this problem from occurring.  A stool softener like colace is also recommended.  This can be purchased over the counter at your pharmacy.  You can take it up to 3 times a day.  If you do not have a bowel movement after 24 hrs since your surgery, take one does of milk of magnesia.  If you still haven't had a bowel movement 8-12 hours after that dose, take another dose.  If you don't have a bowel movement 48 hrs after surgery,  purchase a Fleets enema from the drug store and administer gently per package instructions.  If you still are having trouble with your bowel movements after that, please call the office for further instructions. If you develop diarrhea or have many loose bowel movements, simplify your diet to bland foods & liquids for a few days.  Stop any stool softeners and decrease your fiber supplement.  Switching to mild anti-diarrheal medications (Kayopectate, Pepto Bismol) can help.   If this worsens or does not improve, please call us .  Wound Care Remove your bandages before your first bowel movement or 8 hours after surgery.     Remove any wound packing material at this tim,e as well.  You do not need to repack the wound unless instructed otherwise.  Wear an absorbent pad or soft cotton gauze in your underwear to catch any drainage and help keep the area clean. You should change this every 2-3 hours while awake. Keep the area clean and dry.  Bathe / shower every day, especially after bowel movements.  Keep the area clean by showering / bathing over the incision / wound.   It is okay to soak an open wound to help wash it.  Wet wipes or showers / gentle washing after bowel movements is often less traumatic than regular toilet paper. You may have some styrofoam-like soft packing in the rectum which will come out with the first bowel movement.  You will often notice bleeding with bowel movements.  This should slow down by the end of the first week of surgery Expect some drainage.  This should slow down, too, by the end of the first week of surgery.  Wear an absorbent pad or soft cotton gauze in your underwear until the drainage stops. Do Not sit on a rubber or pillow ring.  This can make you symptoms worse.  You may sit on a soft pillow if needed.  ACTIVITIES as tolerated:   You may resume regular (light) daily activities beginning the next day--such as daily self-care, walking, climbing stairs--gradually increasing activities as tolerated.  If you can walk 30 minutes without difficulty, it is safe to try more intense activity such as jogging, treadmill, bicycling, low-impact aerobics, swimming, etc. Save the most intensive and strenuous activity for last such as sit-ups, heavy lifting, contact sports, etc  Refrain from any heavy lifting or straining until you are off narcotics for pain control.   You may drive when you are no longer taking prescription pain medication, you can  comfortably sit for long periods of time, and you can safely maneuver your car and apply brakes. You may have sexual intercourse when it is comfortable.  FOLLOW UP in our office Please call [facility] at [phone] to set up an appointment to see your surgeon in the office for a follow-up appointment approximately 3-4 weeks after your surgery. Make sure that you call for this appointment the day you arrive home to insure a convenient appointment time. 10. IF YOU HAVE DISABILITY OR FAMILY LEAVE FORMS, BRING THEM TO THE OFFICE FOR PROCESSING.  DO NOT GIVE THEM TO YOUR DOCTOR.     WHEN TO CALL US  [phone]: Poor pain control Reactions / problems with new medications (rash/itching, nausea, etc)  Fever over 101.5 F (38.5 C) Inability to urinate Nausea and/or vomiting Worsening swelling or bruising Continued bleeding from incision. Increased pain, redness, or drainage from the incision  The clinic staff is available to answer your questions during regular business hours (8:30am-5pm).  Please don't hesitate to call and ask to speak to one of our nurses for clinical concerns.   A surgeon from [facility] is always on call at the hospitals   If you have a medical emergency, go to the nearest emergency room or call 911.    [facility] [address], [place], [place]  [address] ? MAIN: [phone] ? TOLL FREE: [phone] ? FAX [phone] [email or web address]    Post Anesthesia Home Care Instructions  Activity: Get plenty of rest for the remainder of the day. A responsible individual must stay with you for 24 hours following the procedure.  For the next 24 hours, DO NOT: -Drive a car -Advertising copywriter -Drink alcoholic beverages -Take any medication unless instructed by your physician -Make any legal decisions or sign important papers.  Meals: Start with liquid foods such as gelatin or soup. Progress to regular foods  as tolerated. Avoid greasy, spicy, heavy foods. If nausea and/or vomiting occur, drink only clear liquids until the nausea and/or vomiting subsides. Call your physician if vomiting continues.  Special Instructions/Symptoms: Your throat may feel dry or sore from the anesthesia or the breathing tube placed in your throat during surgery. If this causes discomfort, gargle with warm salt water . The discomfort should disappear within 24 hours.  If you had a scopolamine patch placed behind your ear for the management of post- operative nausea and/or vomiting:  1. The medication in the patch is effective for 72 hours, after which it should be removed.  Wrap patch in a tissue and discard in the trash. Wash hands thoroughly with soap and water . 2. You may remove the patch earlier than 72 hours if you experience unpleasant side effects which may include dry mouth, dizziness or visual disturbances. 3. Avoid touching the patch. Wash your hands with soap and water  after contact with the patch.      Next dose of Tylenol  may be given at 12:30pm if needed. Next dose of NSAIDs (Ibuprofen /Motrin /Aleve ) may be given at 4:00pm if needed.

## 2023-07-27 NOTE — Op Note (Signed)
 07/27/2023  8:15 AM  PATIENT:  Dylan [name]  58 y.o. male  Patient Care Team: Delbert Clam, MD as PCP - General (Family Medicine) Luiz Channel, MD as Consulting Physician (Infectious Diseases)  PRE-OPERATIVE DIAGNOSIS:  LGSIL PAP SMEAR OF ANUS  POST-OPERATIVE DIAGNOSIS:  LGSIL PAP SMEAR OF ANUS, ANAL FISTULA  PROCEDURE:  ANOSCOPY, HIGH RESOLUTION BIOPSY AND ABLATION ANAL CANAL ANAL FISTULOTOMY    Surgeon(s): Debby Hila, MD  ASSISTANT: none   ANESTHESIA:   local and MAC  EBL: 10ml  Total I/O In: -  Out: 10 [Blood:10]  SPECIMEN:  Source of Specimen:  anal fistula tract (L post), anal canal (R ant)  DISPOSITION OF SPECIMEN:  PATHOLOGY  COUNTS:  YES  PLAN OF CARE: Discharge to home after PACU  PATIENT DISPOSITION:  PACU - hemodynamically stable.  INDICATION: 58 y.o. M with anal pain and LSIL on anal Pap  OR FINDINGS: L posterior fistula and mass embedded in the sphincter complex, smal R anterior anal canal condyloma  DESCRIPTION: The patient was identified in the preoperative holding area and taken to the OR where they were laid supine on the operating room table in lithotomy position. MAC anesthesia was smoothly induced.  The patient was then prepped and draped in the usual sterile fashion. A surgical timeout was performed indicating the correct patient, procedure, positioning and preoperative antibioitics. SCDs were noted to be in place and functioning prior to the operation.   I began by evaluating the anal canal.  There was a mass palpated in the left posterior anal canal.  This was draining purulence.  A fistula probe was inserted and exited into a large internal opening.  There was a small amount of superficial external sphincter fibers above.  I decided to perform a fistulotomy using electrocautery.  There was a 3 mm mass arising from the sphincter complex that appeared abnormal and different from the fistula tract.  A biopsy was taken and sent to pathology  for further examination.  The edges of the fistula tract were marsupialized using a 2-0 chromic suture.  After this was completed, a sponge was soaked in 5% acetic acid was placed over the perianal region and inside the anal canal. This was allowed to soak for 2 minutes. The sponge was removed and the perianal region was evaluated with a colposcope.  There were punctate discolorations noted on the skin but they did not change with acetic acid.  The internal anal canal was evaluated via anoscopy with a Hill-Ferguson anoscope.  There w was a small condyloma noted in the right anterior anal canal just distal to the dentate line.  This was biopsied and sent to pathology.  The area was then ablated with electrocautery. After this was completed, the biopsy site was closed using a 2-0 chromic suture.  Additional Marcaine was placed around the surgical sites for postoperative pain control.  A dressing was applied.  The patient was then awakened from anesthesia and sent to the postanesthesia care unit in stable condition. All counts were correct operating room staff.  Hila JAYSON Debby, MD  [facility]

## 2023-07-27 NOTE — Anesthesia Preprocedure Evaluation (Signed)
 Anesthesia Evaluation  Patient identified by MRN, date of birth, ID band Patient awake    Reviewed: Allergy & Precautions, H&P , NPO status , Patient's Chart, lab work & pertinent test results  Airway Mallampati: II   Neck ROM: full    Dental   Pulmonary Current Smoker and Patient abstained from smoking.   breath sounds clear to auscultation       Cardiovascular hypertension,  Rhythm:regular Rate:Normal     Neuro/Psych  PSYCHIATRIC DISORDERS Anxiety Depression Bipolar Disorder      GI/Hepatic ,GERD  ,,(+) Hepatitis -  Endo/Other    Renal/GU      Musculoskeletal  (+) Arthritis ,    Abdominal   Peds  Hematology  (+) HIV  Anesthesia Other Findings   Reproductive/Obstetrics                              Anesthesia Physical Anesthesia Plan  ASA: 3  Anesthesia Plan: MAC   Post-op Pain Management:    Induction: Intravenous  PONV Risk Score and Plan: 0 and Propofol  infusion, Treatment may vary due to age or medical condition and Midazolam   Airway Management Planned: Simple Face Mask  Additional Equipment:   Intra-op Plan:   Post-operative Plan:   Informed Consent: I have reviewed the patients History and Physical, chart, labs and discussed the procedure including the risks, benefits and alternatives for the proposed anesthesia with the patient or authorized representative who has indicated his/her understanding and acceptance.     Dental advisory given  Plan Discussed with: CRNA, Anesthesiologist and Surgeon  Anesthesia Plan Comments:         Anesthesia Quick Evaluation

## 2023-07-28 ENCOUNTER — Encounter (HOSPITAL_BASED_OUTPATIENT_CLINIC_OR_DEPARTMENT_OTHER): Payer: Self-pay | Admitting: General Surgery

## 2023-08-01 LAB — SURGICAL PATHOLOGY

## 2023-08-06 ENCOUNTER — Other Ambulatory Visit (HOSPITAL_COMMUNITY)
Admission: RE | Admit: 2023-08-06 | Discharge: 2023-08-06 | Disposition: A | Discharge status: Home / Self Care | Payer: MEDICAID | Source: Ambulatory Visit | Attending: Medical Genetics | Admitting: Medical Genetics

## 2023-08-07 ENCOUNTER — Emergency Department (HOSPITAL_COMMUNITY): Payer: MEDICAID

## 2023-08-07 ENCOUNTER — Inpatient Hospital Stay (HOSPITAL_COMMUNITY)
Admission: EM | Admit: 2023-08-07 | Discharge: 2023-08-11 | DRG: 565 | Disposition: A | Discharge status: Home / Self Care | Payer: MEDICAID | Attending: Internal Medicine | Admitting: Internal Medicine

## 2023-08-07 ENCOUNTER — Other Ambulatory Visit: Payer: Self-pay

## 2023-08-07 ENCOUNTER — Encounter (HOSPITAL_COMMUNITY): Payer: Self-pay

## 2023-08-07 DIAGNOSIS — G629 Polyneuropathy, unspecified: Secondary | ICD-10-CM | POA: Diagnosis present

## 2023-08-07 DIAGNOSIS — Z800 Family history of malignant neoplasm of digestive organs: Secondary | ICD-10-CM

## 2023-08-07 DIAGNOSIS — Z91048 Other nonmedicinal substance allergy status: Secondary | ICD-10-CM

## 2023-08-07 DIAGNOSIS — Z91040 Latex allergy status: Secondary | ICD-10-CM

## 2023-08-07 DIAGNOSIS — F17210 Nicotine dependence, cigarettes, uncomplicated: Secondary | ICD-10-CM | POA: Diagnosis present

## 2023-08-07 DIAGNOSIS — N179 Acute kidney failure, unspecified: Secondary | ICD-10-CM | POA: Diagnosis present

## 2023-08-07 DIAGNOSIS — Z823 Family history of stroke: Secondary | ICD-10-CM

## 2023-08-07 DIAGNOSIS — R7303 Prediabetes: Secondary | ICD-10-CM | POA: Diagnosis present

## 2023-08-07 DIAGNOSIS — Z860100 Personal history of colon polyps, unspecified: Secondary | ICD-10-CM

## 2023-08-07 DIAGNOSIS — Z79899 Other long term (current) drug therapy: Secondary | ICD-10-CM

## 2023-08-07 DIAGNOSIS — I10 Essential (primary) hypertension: Secondary | ICD-10-CM | POA: Diagnosis present

## 2023-08-07 DIAGNOSIS — F1910 Other psychoactive substance abuse, uncomplicated: Secondary | ICD-10-CM | POA: Diagnosis present

## 2023-08-07 DIAGNOSIS — Z8249 Family history of ischemic heart disease and other diseases of the circulatory system: Secondary | ICD-10-CM

## 2023-08-07 DIAGNOSIS — E785 Hyperlipidemia, unspecified: Secondary | ICD-10-CM | POA: Diagnosis present

## 2023-08-07 DIAGNOSIS — Z880 Allergy status to penicillin: Secondary | ICD-10-CM

## 2023-08-07 DIAGNOSIS — F1410 Cocaine abuse, uncomplicated: Secondary | ICD-10-CM | POA: Diagnosis present

## 2023-08-07 DIAGNOSIS — X509XXA Other and unspecified overexertion or strenuous movements or postures, initial encounter: Secondary | ICD-10-CM

## 2023-08-07 DIAGNOSIS — Z833 Family history of diabetes mellitus: Secondary | ICD-10-CM

## 2023-08-07 DIAGNOSIS — F319 Bipolar disorder, unspecified: Secondary | ICD-10-CM | POA: Diagnosis present

## 2023-08-07 DIAGNOSIS — Z96652 Presence of left artificial knee joint: Secondary | ICD-10-CM | POA: Diagnosis present

## 2023-08-07 DIAGNOSIS — R7989 Other specified abnormal findings of blood chemistry: Secondary | ICD-10-CM | POA: Diagnosis present

## 2023-08-07 DIAGNOSIS — T796XXA Traumatic ischemia of muscle, initial encounter: Principal | ICD-10-CM | POA: Diagnosis present

## 2023-08-07 DIAGNOSIS — F17290 Nicotine dependence, other tobacco product, uncomplicated: Secondary | ICD-10-CM | POA: Diagnosis present

## 2023-08-07 DIAGNOSIS — B20 Human immunodeficiency virus [HIV] disease: Secondary | ICD-10-CM | POA: Diagnosis present

## 2023-08-07 DIAGNOSIS — M6282 Rhabdomyolysis: Principal | ICD-10-CM | POA: Diagnosis present

## 2023-08-07 DIAGNOSIS — F17200 Nicotine dependence, unspecified, uncomplicated: Secondary | ICD-10-CM | POA: Diagnosis present

## 2023-08-07 LAB — CBC
HCT: 44.7 % (ref 39.0–52.0)
Hemoglobin: 15.3 g/dL (ref 13.0–17.0)
MCH: 30.5 pg (ref 26.0–34.0)
MCHC: 34.2 g/dL (ref 30.0–36.0)
MCV: 89.2 fL (ref 80.0–100.0)
Platelets: 226 K/uL (ref 150–400)
RBC: 5.01 MIL/uL (ref 4.22–5.81)
RDW: 14.4 % (ref 11.5–15.5)
WBC: 11.2 K/uL — ABNORMAL HIGH (ref 4.0–10.5)
nRBC: 0 % (ref 0.0–0.2)

## 2023-08-07 MED ORDER — LACTATED RINGERS IV BOLUS
1000.0000 mL | Freq: Once | INTRAVENOUS | Status: AC
  Administered 2023-08-07: 1000 mL via INTRAVENOUS

## 2023-08-07 NOTE — ED Triage Notes (Signed)
 [place] [facility] c/o dizziness and dark urine x 1 day. Pt states that he has had rhabdo in the past and thinks that may be what he is experiencing again.

## 2023-08-07 NOTE — ED Provider Notes (Cosign Needed Addendum)
 [place] [facility] Provider Note   CSN: [id number] Arrival date & time: 08/07/23  2233     Patient presents with: Dizziness   Dylan [name] is a 58 y.o. male with PMH of polysubstance abuse, bipolar disorder, HIV, dyslipidemia, elevated LFTs, acute otitis, rhabdomyolysis.  Patient presents to ED for evaluation of lightheadedness, dark-colored urine.  Patient complains of lightheadedness described as the room spinning.  Reports this began earlier today as he was walking [place] job [place] the bus dep[place].  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  States that he was recently admitted for rhabdomyolysis and today feels very similar to when he was last admitted.  Reports that he has been drinking about 6 bottles of water  with Crystal light packet mixed in over the course of the last 3 days.  Reports no syncope.  Denies any nausea, vomiting or abdominal pain.  Denies headache, neck pain, blurred vision, one-sided weakness or numbness.  Endorsing chest pain and slight shortness of breath that resolved [place].  Endorsing very dark-colored urine but denies any dysuria.  Denies fevers at home.  He is also complaining of cramping in his bilateral lower extremities.    Dizziness      Prior to Admission medications   Medication Sig Start Date End Date Taking? Authorizing Provider  bictegravir-emtricitabine -tenofovir  AF (BIKTARVY ) 50-200-25 MG TABS tablet Take 1 tablet by mouth daily. 04/21/23   Donnajean Lynwood DEL, PA-C  bictegravir-emtricitabine -tenofovir  AF (BIKTARVY ) 50-200-25 MG TABS tablet Take 1 tablet by mouth daily. 07/23/23   Luiz Channel, MD  diclofenac  Sodium (VOLTAREN ) 1 % GEL Apply 4 g topically 4 (four) times daily. 06/19/23   Newlin, Enobong, MD  gabapentin  (NEURONTIN ) 300 MG capsule Take 2 capsules (600 mg total) by mouth 2 (two) times daily. 07/07/23   Newlin, Enobong, MD  rosuvastatin   (CRESTOR ) 20 MG tablet Take 1 tablet (20 mg total) by mouth daily. 07/23/23   Luiz Channel, MD  traMADol  (ULTRAM ) 50 MG tablet Take 1-2 tablets (50-100 mg total) by mouth every 6 (six) hours as needed. 07/27/23   Debby Hila, MD  traZODone  (DESYREL ) 150 MG tablet Take 150 mg by mouth at bedtime.    [provider]    Allergies: Penicillins, Latex, and Tape    Review of Systems  Musculoskeletal:  Positive for myalgias.  Neurological:  Positive for light-headedness.  All other systems reviewed and are negative.   Updated Vital Signs BP (!) 139/91 (BP Location: Right Arm)   Pulse (!) 109   Temp 99.4 F (37.4 C) (Temporal)   Resp 15   Ht 6' 2 (1.88 m)   Wt 105 kg   SpO2 96%   BMI 29.72 kg/m   Physical Exam Vitals and nursing note reviewed.  Constitutional:      General: He is not in acute distress.    Appearance: He is well-developed.  HENT:     Head: Normocephalic and atraumatic.  Eyes:     Conjunctiva/sclera: Conjunctivae normal.  Cardiovascular:     Rate and Rhythm: Normal rate and regular rhythm.     Heart sounds: No murmur heard. Pulmonary:     Effort: Pulmonary effort is normal. No respiratory distress.     Breath sounds: Normal breath sounds.  Abdominal:     Palpations: Abdomen is soft.     Tenderness: There is no abdominal tenderness.  Musculoskeletal:  General: No swelling.     Cervical back: Neck supple.  Skin:    General: Skin is warm and dry.     Capillary Refill: Capillary refill takes less than 2 seconds.  Neurological:     Mental Status: He is alert and oriented to person, place, and time. Mental status is at baseline.     Comments: CN III through XII are intact.  Intact Parinaud's, heel-to-shin.  No pronator drift.  No slurred speech.  Equal strength throughout.  Equal sensation throughout.  PERRL.  Tracks cross midline.  Alert and oriented x 4.  Psychiatric:        Mood and Affect: Mood normal.     (all labs ordered are listed,  but only abnormal results are displayed) Labs Reviewed  CBC - Abnormal; Notable for the following components:      Result Value   WBC 11.2 (*)    All other components within normal limits  CK - Abnormal; Notable for the following components:   Total CK 10,764 (*)    All other components within normal limits  COMPREHENSIVE METABOLIC PANEL WITH GFR - Abnormal; Notable for the following components:   Sodium 133 (*)    BUN 29 (*)    Creatinine, Ser 1.43 (*)    AST 169 (*)    ALT 64 (*)    GFR, Estimated 57 (*)    All other components within normal limits  TROPONIN I (HIGH SENSITIVITY) - Abnormal; Notable for the following components:   Troponin I (High Sensitivity) 33 (*)    All other components within normal limits  TROPONIN I (HIGH SENSITIVITY) - Abnormal; Notable for the following components:   Troponin I (High Sensitivity) 31 (*)    All other components within normal limits  URINALYSIS, ROUTINE W REFLEX MICROSCOPIC  RAPID URINE DRUG SCREEN, HOSP PERFORMED  CBC WITH DIFFERENTIAL/PLATELET  COMPREHENSIVE METABOLIC PANEL WITH GFR  MAGNESIUM   CK  PROTIME-INR    EKG: EKG Interpretation Date/Time:  Wednesday August 08 2023 00:19:08 EDT Ventricular Rate:  94 PR Interval:  150 QRS Duration:  92 QT Interval:  382 QTC Calculation: 477 R Axis:   73  Text Interpretation: Normal sinus rhythm Normal ECG When compared with ECG of 11-Jul-2023 06:46, PREVIOUS ECG IS PRESENT Confirmed by Bari Pfeiffer ([id number]) on 08/08/2023 12:21:30 AM  Radiology: [facility] Chest Portable 1 View Result Date: 08/07/2023 CLINICAL DATA:  Shortness of breath and chest pain EXAM: PORTABLE CHEST 1 VIEW COMPARISON:  05/15/2023 FINDINGS: Cardiac shadow is within normal limits. The lungs are well aerated bilaterally. No focal infiltrate or effusion is seen. No bony abnormality is noted. IMPRESSION: No active disease. Electronically Signed   By: Oneil Devonshire M.D.   On: 08/07/2023 23:07    Procedures   Medications  Ordered in the ED  0.9 %  sodium chloride  infusion (has no administration in time range)  acetaminophen  (TYLENOL ) tablet 650 mg (has no administration in time range)    Or  acetaminophen  (TYLENOL ) suppository 650 mg (has no administration in time range)  melatonin tablet 3 mg (has no administration in time range)  ondansetron  (ZOFRAN ) injection 4 mg (has no administration in time range)  oxyCODONE -acetaminophen  (PERCOCET/ROXICET) 5-325 MG per tablet 1 tablet (has no administration in time range)  lactated ringers  bolus 1,000 mL (0 mLs Intravenous Stopped 08/08/23 0124)  lactated ringers  bolus 1,000 mL (1,000 mLs Intravenous New Bag/Given 08/08/23 0132)    Clinical Course as of 08/08/23 0304  Wed Aug 08, 2023  0222 Nontraumatic exertional rhabdo [CG]    Clinical Course User Index [CG] Ruthell Lonni FALCON, PA-C   Medical Decision Making Amount and/or Complexity of Data Reviewed Labs: ordered. Radiology: ordered. ECG/medicine tests: ordered.  Risk Prescription drug management. Decision regarding hospitalization.   This is a 58 year old male with a history of rhabdomyolysis who presents to the ED complaining of bilateral lower extremity cramping, lightheadedness.  Reports similar presentation and admission within this month for rhabdomyolysis.  Reports that he has been drinking Crystal light packet with water  over the last 3 days.  Denies any drug use however does have history of methamphetamine use in his chart.  Arrives tachycardic, temperature of 99.4 orally.  Workup initiated.  On examination the patient is afebrile, tachycardic to 109.  Lung sounds are clear bilaterally and he has no hypoxia.  Abdomen soft and compressible.  Neurological examination at baseline without focal neurodeficits.  Labs are collected to include CBC, CMP, troponin, CK, urinalysis and UDS.  Also collected EKG and chest x-ray.  Patient CBC has leukocytosis 11.9.  Metabolic panel revealed sodium 133, BUN 29,  creatinine 1.43, AST 169, ALT 64 and GFR 57.  Anion gap 9.  Patient creatinine has elevated over the last 2 weeks, 1.112 weeks ago.  Patient troponin 33 initially, delta 31.  Suspect this is due to demand ischemia.  Patient CK elevated at 10,764.  Patient in nontraumatic exertional rhabdomyolysis.  Assume patient elevated AST, ALT due to rhabdomyolysis.  AST about 3-1 to ALT.  Patient chart reviewed.  Patient has history of methamphetamine use.  Denies this to me.  Have added on UDS.  Chest x-ray clear.  EKG nonischemic.  At this time patient given 2 L LR.  Placed on maintenance fluids at this time.  Will consult for admission.  Discussed admission with Dr. Marcene of [facility] hospitalist service.  Has agreed to admit patient.  Patient amenable to plan.  Stable on admission.    Final diagnoses:  Non-traumatic rhabdomyolysis  AKI (acute kidney injury) [facility])    ED Discharge Orders     None            Ruthell Lonni FALCON, PA-C 08/08/23 0305    Bari Charmaine FALCON, MD 08/09/23 [phone]

## 2023-08-07 NOTE — ED Provider Notes (Incomplete)
 [place] [facility] Provider Note   CSN: [id number] Arrival date & time: 08/07/23  2233     Patient presents with: Dizziness   Dylan [name] is a 58 y.o. male with PMH of polysubstance abuse, bipolar disorder, HIV, dyslipidemia, elevated LFTs, acute otitis, rhabdomyolysis.  Patient presents to ED for evaluation of lightheadedness, dark-colored urine.  Patient complains of lightheadedness described as the room spinning.  Reports this began earlier today as he [place].  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  States that he was recently admitted for rhabdomyolysis and today feels very similar to when he was last admitted.  Reports that he has been drinking about 6 bottles of water  over the course of the last 3 days.  Reports no syncope.  Denies any nausea, vomiting or abdominal pain.  Denies headache, neck pain, blurred vision, one-sided weakness or numbness.  Endorsing chest pain and slight shortness of breath that resolved [place].  Endorsing very dark-colored urine but denies any dysuria.  Denies fevers at home.     Dizziness      Prior to Admission medications   Medication Sig Start Date End Date Taking? Authorizing Provider  bictegravir-emtricitabine -tenofovir  AF (BIKTARVY ) 50-200-25 MG TABS tablet Take 1 tablet by mouth daily. 04/21/23   Donnajean Lynwood DEL, PA-C  bictegravir-emtricitabine -tenofovir  AF (BIKTARVY ) 50-200-25 MG TABS tablet Take 1 tablet by mouth daily. 07/23/23   Luiz Channel, MD  diclofenac  Sodium (VOLTAREN ) 1 % GEL Apply 4 g topically 4 (four) times daily. 06/19/23   Newlin, Enobong, MD  gabapentin  (NEURONTIN ) 300 MG capsule Take 2 capsules (600 mg total) by mouth 2 (two) times daily. 07/07/23   Newlin, Enobong, MD  rosuvastatin  (CRESTOR ) 20 MG tablet Take 1 tablet (20 mg total) by mouth daily. 07/23/23   Luiz Channel, MD  traMADol   (ULTRAM ) 50 MG tablet Take 1-2 tablets (50-100 mg total) by mouth every 6 (six) hours as needed. 07/27/23   Debby Hila, MD  traZODone  (DESYREL ) 150 MG tablet Take 150 mg by mouth at bedtime.    [provider]    Allergies: Penicillins, Latex, and Tape    Review of Systems  Neurological:  Positive for dizziness.    Updated Vital Signs BP (!) 139/91 (BP Location: Right Arm)   Pulse (!) 109   Temp 99.4 F (37.4 C) (Temporal)   Resp 15   Ht 6' 2 (1.88 m)   Wt 105 kg   SpO2 96%   BMI 29.72 kg/m   Physical Exam  (all labs ordered are listed, but only abnormal results are displayed) Labs Reviewed  CBC  COMPREHENSIVE METABOLIC PANEL WITH GFR  CK  URINALYSIS, ROUTINE W REFLEX MICROSCOPIC  TROPONIN I (HIGH SENSITIVITY)    EKG: None  Radiology: DG Chest Portable 1 View Result Date: 08/07/2023 CLINICAL DATA:  Shortness of breath and chest pain EXAM: PORTABLE CHEST 1 VIEW COMPARISON:  05/15/2023 FINDINGS: Cardiac shadow is within normal limits. The lungs are well aerated bilaterally. No focal infiltrate or effusion is seen. No bony abnormality is noted. IMPRESSION: No active disease. Electronically Signed   By: Oneil Devonshire M.D.   On: 08/07/2023 23:07    {Document cardiac monitor, telemetry assessment procedure when appropriate:32947} Procedures   Medications Ordered in the ED  lactated ringers  bolus 1,000 mL (has no administration in time range)      {  Click here for ABCD2, HEART and other calculators REFRESH Note before signing:1}                              Medical Decision Making Amount and/or Complexity of Data Reviewed Labs: ordered. Radiology: ordered. ECG/medicine tests: ordered.   ***  {Document critical care time when appropriate  Document review of labs and clinical decision tools ie CHADS2VASC2, etc  Document your independent review of radiology images and any outside records  Document your discussion with family members, caretakers and with  consultants  Document social determinants of health affecting pt's care  Document your decision making why or why not admission, treatments were needed:32947:::1}   Final diagnoses:  None    ED Discharge Orders     None

## 2023-08-07 NOTE — ED Provider Triage Note (Signed)
 Emergency Medicine Provider Triage Evaluation Note  Dylan [name] , a 58 y.o. male  was evaluated in triage.  Pt complains of lightheadedness described as the room spinning.  Reports this began earlier today as he [place].  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  States that he was recently admitted for rhabdomyolysis and today feels very similar to when he was last admitted.  Reports that he has been drinking about 6 bottles of water  over the course of the last 3 days.  Reports no syncope.  Denies any nausea, vomiting or abdominal pain.  Denies headache, neck pain, blurred vision, one-sided weakness or numbness.  Endorsing chest pain and slight shortness of breath that [place]  Endorsing very dark-colored urine but denies any dysuria.  Denies fevers at home.  Review of Systems  Positive:  Negative:   Physical Exam  BP (!) 139/91 (BP Location: Right Arm)   Pulse (!) 109   Temp 99.4 F (37.4 C) (Temporal)   Resp 15   SpO2 96%  Gen:   Awake, no distress   Resp:  Normal effort  MSK:   Moves extremities without difficulty  Other:    Medical Decision Making  Medically screening exam initiated at 10:41 PM.  Appropriate orders placed.  [name] was informed that the remainder of the evaluation will be completed by another provider, this initial triage assessment does not replace that evaluation, and the importance of remaining in the ED until their evaluation is complete.     Ruthell Lonni FALCON, PA-C 08/07/23 2243

## 2023-08-08 ENCOUNTER — Encounter (HOSPITAL_COMMUNITY): Payer: Self-pay | Admitting: Family Medicine

## 2023-08-08 DIAGNOSIS — M6282 Rhabdomyolysis: Secondary | ICD-10-CM

## 2023-08-08 LAB — CBC WITH DIFFERENTIAL/PLATELET
Abs Immature Granulocytes: 0.01 K/uL (ref 0.00–0.07)
Basophils Absolute: 0 K/uL (ref 0.0–0.1)
Basophils Relative: 0 %
Eosinophils Absolute: 0.2 K/uL (ref 0.0–0.5)
Eosinophils Relative: 2 %
HCT: 39.9 % (ref 39.0–52.0)
Hemoglobin: 13.6 g/dL (ref 13.0–17.0)
Immature Granulocytes: 0 %
Lymphocytes Relative: 31 %
Lymphs Abs: 2.3 K/uL (ref 0.7–4.0)
MCH: 30.4 pg (ref 26.0–34.0)
MCHC: 34.1 g/dL (ref 30.0–36.0)
MCV: 89.3 fL (ref 80.0–100.0)
Monocytes Absolute: 0.8 K/uL (ref 0.1–1.0)
Monocytes Relative: 11 %
Neutro Abs: 3.9 K/uL (ref 1.7–7.7)
Neutrophils Relative %: 56 %
Platelets: 169 K/uL (ref 150–400)
RBC: 4.47 MIL/uL (ref 4.22–5.81)
RDW: 14.2 % (ref 11.5–15.5)
WBC: 7.2 K/uL (ref 4.0–10.5)
nRBC: 0 % (ref 0.0–0.2)

## 2023-08-08 LAB — COMPREHENSIVE METABOLIC PANEL WITH GFR
ALT: 64 U/L — ABNORMAL HIGH (ref 0–44)
ALT: 62 U/L — ABNORMAL HIGH (ref 0–44)
AST: 169 U/L — ABNORMAL HIGH (ref 15–41)
AST: 164 U/L — ABNORMAL HIGH (ref 15–41)
Albumin: 4.1 g/dL (ref 3.5–5.0)
Albumin: 3.3 g/dL — ABNORMAL LOW (ref 3.5–5.0)
Alkaline Phosphatase: 73 U/L (ref 38–126)
Alkaline Phosphatase: 69 U/L (ref 38–126)
Anion gap: 9 (ref 5–15)
Anion gap: 8 (ref 5–15)
BUN: 29 mg/dL — ABNORMAL HIGH (ref 6–20)
BUN: 26 mg/dL — ABNORMAL HIGH (ref 6–20)
CO2: 23 mmol/L (ref 22–32)
CO2: 22 mmol/L (ref 22–32)
Calcium: 9.2 mg/dL (ref 8.9–10.3)
Calcium: 8.6 mg/dL — ABNORMAL LOW (ref 8.9–10.3)
Chloride: 101 mmol/L (ref 98–111)
Chloride: 106 mmol/L (ref 98–111)
Creatinine, Ser: 1.43 mg/dL — ABNORMAL HIGH (ref 0.61–1.24)
Creatinine, Ser: 1.19 mg/dL (ref 0.61–1.24)
GFR, Estimated: 57 mL/min — ABNORMAL LOW (ref >60)
GFR, Estimated: >60 mL/min (ref >60)
Glucose, Bld: 90 mg/dL (ref 70–99)
Glucose, Bld: 114 mg/dL — ABNORMAL HIGH (ref 70–99)
Potassium: 4 mmol/L (ref 3.5–5.1)
Potassium: 3.9 mmol/L (ref 3.5–5.1)
Sodium: 133 mmol/L — ABNORMAL LOW (ref 135–145)
Sodium: 136 mmol/L (ref 135–145)
Total Bilirubin: 1.2 mg/dL (ref 0.0–1.2)
Total Bilirubin: 0.8 mg/dL (ref 0.0–1.2)
Total Protein: 7.4 g/dL (ref 6.5–8.1)
Total Protein: 6.1 g/dL — ABNORMAL LOW (ref 6.5–8.1)

## 2023-08-08 LAB — URINALYSIS, ROUTINE W REFLEX MICROSCOPIC
Bilirubin Urine: NEGATIVE
Glucose, UA: NEGATIVE mg/dL
Hgb urine dipstick: NEGATIVE
Ketones, ur: NEGATIVE mg/dL
Leukocytes,Ua: NEGATIVE
Nitrite: NEGATIVE
Protein, ur: NEGATIVE mg/dL
Specific Gravity, Urine: 1.03 (ref 1.005–1.030)
pH: 5 (ref 5.0–8.0)

## 2023-08-08 LAB — RAPID URINE DRUG SCREEN, HOSP PERFORMED
Amphetamines: NOT DETECTED
Barbiturates: NOT DETECTED
Benzodiazepines: NOT DETECTED
Cocaine: POSITIVE — AB
Opiates: NOT DETECTED
Tetrahydrocannabinol: POSITIVE — AB

## 2023-08-08 LAB — PROTIME-INR
INR: 1 (ref 0.8–1.2)
Prothrombin Time: 13.6 s (ref 11.4–15.2)

## 2023-08-08 LAB — CK
Total CK: 10764 U/L — ABNORMAL HIGH (ref 49–397)
Total CK: 11247 U/L — ABNORMAL HIGH (ref 49–397)

## 2023-08-08 LAB — TROPONIN I (HIGH SENSITIVITY)
Troponin I (High Sensitivity): 33 ng/L — ABNORMAL HIGH (ref <18)
Troponin I (High Sensitivity): 31 ng/L — ABNORMAL HIGH (ref <18)

## 2023-08-08 LAB — MAGNESIUM: Magnesium: 2.2 mg/dL (ref 1.7–2.4)

## 2023-08-08 MED ORDER — ENOXAPARIN SODIUM 40 MG/0.4ML IJ SOSY
40.0000 mg | PREFILLED_SYRINGE | INTRAMUSCULAR | Status: DC
  Administered 2023-08-08 – 2023-08-11 (×4): 40 mg via SUBCUTANEOUS
  Filled 2023-08-08 (×4): qty 0.4

## 2023-08-08 MED ORDER — ACETAMINOPHEN 325 MG PO TABS
650.0000 mg | ORAL_TABLET | Freq: Four times a day (QID) | ORAL | Status: DC | PRN
  Administered 2023-08-10: 650 mg via ORAL
  Filled 2023-08-08: qty 2

## 2023-08-08 MED ORDER — TRAZODONE HCL 50 MG PO TABS
150.0000 mg | ORAL_TABLET | Freq: Every day | ORAL | Status: DC
Start: 2023-08-08 — End: 2023-08-11
  Administered 2023-08-08 – 2023-08-10 (×3): 150 mg via ORAL
  Filled 2023-08-08 (×3): qty 3

## 2023-08-08 MED ORDER — GABAPENTIN 300 MG PO CAPS
600.0000 mg | ORAL_CAPSULE | Freq: Two times a day (BID) | ORAL | Status: DC
  Administered 2023-08-08 – 2023-08-11 (×7): 600 mg via ORAL
  Filled 2023-08-08 (×7): qty 2

## 2023-08-08 MED ORDER — SODIUM CHLORIDE 0.9 % IV SOLN: Freq: Once | INTRAVENOUS | Status: DC

## 2023-08-08 MED ORDER — MELATONIN 3 MG PO TABS: 3.0000 mg | ORAL_TABLET | Freq: Every evening | ORAL | Status: DC | PRN

## 2023-08-08 MED ORDER — LACTATED RINGERS IV BOLUS
1000.0000 mL | Freq: Once | INTRAVENOUS | Status: AC
  Administered 2023-08-08: 1000 mL via INTRAVENOUS

## 2023-08-08 MED ORDER — DICLOFENAC SODIUM 1 % EX GEL
4.0000 g | Freq: Four times a day (QID) | CUTANEOUS | Status: DC
  Administered 2023-08-08 – 2023-08-11 (×9): 4 g via TOPICAL
  Filled 2023-08-08: qty 100

## 2023-08-08 MED ORDER — ONDANSETRON HCL 4 MG/2ML IJ SOLN: 4.0000 mg | Freq: Four times a day (QID) | INTRAMUSCULAR | Status: DC | PRN

## 2023-08-08 MED ORDER — SODIUM CHLORIDE 0.9% FLUSH
3.0000 mL | Freq: Two times a day (BID) | INTRAVENOUS | Status: DC
  Administered 2023-08-08 – 2023-08-10 (×4): 3 mL via INTRAVENOUS

## 2023-08-08 MED ORDER — SODIUM CHLORIDE 0.9 % IV SOLN: INTRAVENOUS | Status: AC

## 2023-08-08 MED ORDER — BICTEGRAVIR-EMTRICITAB-TENOFOV 50-200-25 MG PO TABS
1.0000 | ORAL_TABLET | Freq: Every day | ORAL | Status: DC
  Administered 2023-08-08 – 2023-08-11 (×4): 1 via ORAL
  Filled 2023-08-08 (×4): qty 1

## 2023-08-08 MED ORDER — ACETAMINOPHEN 650 MG RE SUPP: 650.0000 mg | Freq: Four times a day (QID) | RECTAL | Status: DC | PRN

## 2023-08-08 MED ORDER — OXYCODONE-ACETAMINOPHEN 5-325 MG PO TABS
1.0000 | ORAL_TABLET | Freq: Four times a day (QID) | ORAL | Status: DC | PRN
  Filled 2023-08-08: qty 1

## 2023-08-08 NOTE — Progress Notes (Addendum)
  Carryover admission to the Day Admitter.  I discussed this case with the EDP, Lonni Lites, PA.  Per these discussions:   This is a 58 year old male who is being admitted for acute nontraumatic rhabdomyolysis after presenting with 2 days of symmetrical muscle cramping in the bilateral lower extremities in the absence of any associated acute focal weakness or any acute focal numbness/paresthesias.  Had some mild lightheadedness over that timeframe.  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   He was hospitalized for nontraumatic rhabdomyolysis a few weeks ago, with similar presentation.   No report of any recent trauma.  The patient denies any history of recreational drug abuse, although EB conveys to me that chart review reveals a history of methamphetamine use.  Urine drug screen is currently pending.  Presenting total CPK level is greater than 10,000.  CMP notable for creatinine of 1.43 compared to most recent prior value of 1.1, mild transaminitis.  Mild elevation in troponin is noted, with presenting trop of 33 and repeat trending down to 31. EKG is reported to show no evidence of acute ischemic changes.  Additionally, chest x-ray shows no evidence of acute cardiopulmonary process.  He has received a total of 2 L of LR in the ED, and is currently receiving continuous NS running at 125 cc/h.  I have placed an order for observation to med/tele for further evaluation management of the above.  I have placed some additional preliminary admit orders via the adult multi-morbid admission order set. I have increased the rate of his normal saline to 200 cc/h x 12 hours.  I have ordered prn Percocet for his symmetrical myalgias in the bilateral lower extremities and ordered a repeat CPK level in the morning.  I have ordered additional morning labs that include CMP, CBC, magnesium  level as well as INR.      Eva Pore,  DO Hospitalist

## 2023-08-08 NOTE — TOC CM/SW Note (Signed)
 Patient Goals and CMS Choice    CSW met with pt at bedside.  Patient states he is living at [facility] in [place], [place].   Per previous Case mangement note, CSW to confirm information with Juliene Dorise [facility] [phone]). Pt states that he is in a rehab program and has about 3 weeks left and plans to return at discharge.   Will need to provide own transportation with [employer]([phone]).  [facility] has reached out to pt he reports, to follow up with services in the community.   CSW will continue to follow.      Expected Discharge Plan and Services                                                Prior Living Arrangements/Services                       Activities of Daily Living   ADL Screening (condition at time of admission) Independently performs ADLs?: Yes (appropriate for developmental age) Is the patient deaf or have difficulty hearing?: No Does the patient have difficulty seeing, even when wearing glasses/contacts?: No Does the patient have difficulty concentrating, remembering, or making decisions?: No  Permission Sought/Granted                  Emotional Assessment              Admission diagnosis:  Rhabdomyolysis [M62.82] AKI (acute kidney injury) (HCC) [N17.9] Non-traumatic rhabdomyolysis [M62.82] Patient Active Problem List   Diagnosis Date Noted   MDD (major depressive disorder), recurrent, severe, with psychosis (HCC) 03/12/2023   Transaminitis 01/04/2023   Bipolar 1 disorder, depressed (HCC) 12/05/2022   Non-traumatic rhabdomyolysis 12/04/2022   Weakness 11/03/2022   Cough 11/03/2022   Essential hypertension 11/03/2022   Peripheral neuropathy 11/03/2022   Bipolar disorder (HCC) 08/27/2022   Cannabis use disorder 08/27/2022   Bipolar disorder, most recent episode depressed (HCC) 08/26/2022   Suicidal ideation 08/26/2022   Rhabdomyolysis 07/01/2022    Non-ST elevated myocardial infarction (HCC) 06/03/2022   Dyslipidemia 06/02/2022   Polysubstance abuse (HCC) 06/02/2022   Obesity (BMI 30-39.9) 06/02/2022   Alcohol use disorder 04/12/2022   PTSD (post-traumatic stress disorder) 04/12/2022   S/P total knee arthroplasty, left 12/14/2021   Total knee replacement status, left 12/14/2021   Chronic fatigue 11/16/2021   Abnormal EKG 11/16/2021   Overweight (BMI 25.0-29.9) 11/16/2021   Elevated LFTs 07/03/2021   Right foot ulcer (HCC) 07/03/2021   Onychomycosis 04/19/2021   Stimulant use disorder 03/16/2021   Nodular radiologic density 02/02/2021   Lesion of liver 02/02/2021   Liver disease 02/02/2021   Vitamin D  deficiency 01/18/2021   Methamphetamine abuse (HCC) 01/17/2021   Lesion of pancreas 01/17/2021   Substance induced mood disorder (HCC) 10/26/2020   Fatty liver 03/09/2020   Generalized anxiety disorder 02/03/2020   Lisfranc dislocation, right, initial encounter    Prediabetes 07/22/2019   Tobacco use disorder 07/10/2018   AKI (acute kidney injury) (HCC)    Chronic 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 coma (HCC)    Acute hepatitis 02/19/2017   Degenerative disc disease 08/21/2011   HIV disease (HCC) 08/17/2011   PCP:  Delbert Clam, MD Pharmacy:   [facility] [phone] - [place], [place] - [address] [place] [address] Phone: [phone] Fax: [phone]  [place] [facility] [address] [place] [address] Phone: [phone] Fax: [phone]     Social Determinants of Health (SDOH) Interventions    Readmission Risk Interventions    06/05/2023    3:36 PM 01/08/2023   12:34 PM  Readmission Risk Prevention Plan  Transportation Screening Complete   Medication Review (RN Care Manager) Complete   PCP or Specialist appointment within 3-5 days of discharge Complete   HRI or Home Care Consult Complete   SW Recovery Care/Counseling Consult     Palliative Care Screening Not Applicable   Skilled Nursing Facility Not Applicable      Information is confidential and restricted. Go to Review Flowsheets to unlock data.

## 2023-08-08 NOTE — Plan of Care (Signed)
 Vitals stable upon arrival from ED. Pt ambulated self from stretcher to bed without evidence of gait instability. No complaints of pain. Pt requesting to shower. PIV wrapped and pt set up with supplies to shower self. Clothing, shoes, & cell phone at bedside. Call bell & phone within reach. Pt verbalizes understanding to call staff for assistance if and when needed.  Problem: Education: Goal: Knowledge of General Education information will improve Description: Including pain rating scale, medication(s)/side effects and non-pharmacologic comfort measures Outcome: Progressing   Problem: Health Behavior/Discharge Planning: Goal: Ability to manage health-related needs will improve Outcome: Progressing   Problem: Clinical Measurements: Goal: Ability to maintain clinical measurements within normal limits will improve Outcome: Progressing Goal: Will remain free from infection Outcome: Progressing Goal: Diagnostic test results will improve Outcome: Progressing Goal: Respiratory complications will improve Outcome: Progressing Goal: Cardiovascular complication will be avoided Outcome: Progressing   Problem: Activity: Goal: Risk for activity intolerance will decrease Outcome: Progressing   Problem: Nutrition: Goal: Adequate nutrition will be maintained Outcome: Progressing   Problem: Coping: Goal: Level of anxiety will decrease Outcome: Progressing   Problem: Elimination: Goal: Will not experience complications related to bowel motility Outcome: Progressing Goal: Will not experience complications related to urinary retention Outcome: Progressing   Problem: Pain Managment: Goal: General experience of comfort will improve and/or be controlled Outcome: Progressing   Problem: Safety: Goal: Ability to remain free from injury will improve Outcome: Progressing   Problem: Skin Integrity: Goal: Risk for impaired skin integrity will decrease Outcome: Progressing

## 2023-08-08 NOTE — H&P (Signed)
 History and Physical    Patient: Dylan [name] FMW:[id number] DOB: 06-Sep-1965 DOA: 08/07/2023 DOS: the patient was seen and examined on 08/08/2023 PCP: Delbert Clam, MD  Patient coming from: Home  Chief Complaint:  Chief Complaint  Patient presents with   Dizziness   HPI: Dylan [name] is a 58 y.o. 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 Though he tried to drink more water , his urine got darker and he felt more unwell prompting visit to ED. He has not had any recent trauma, no new medications, has been off the statin for a while, and denies substance use. In the ED his CK level was >10k, Cr up to 1.43 from putative baseline of 1.1, and LFTs mildly elevated with AST > ALT. He was started on IV fluids and admission requested for recurrent rhabdomyolysis. He has been reluctant to provide urine specimen. Interviewed this morning he gives the above history and states his muscle aches and malaise are not changed from initial arrival to the ED last night.   Review of Systems: As mentioned in the history of present illness. All other systems reviewed and are negative. Past Medical History:  Diagnosis Date   Anxiety    Arthritis    Bipolar 1 disorder (HCC)    Colon polyps    Depression    GERD (gastroesophageal reflux disease)    Hepatitis B    Human immunodeficiency virus (HIV) (HCC)    Hyperlipidemia    Insomnia due to other mental disorder 02/03/2020   Intentional drug overdose (HCC) 11/26/2021   Neuromuscular disorder (HCC)    neuropathy   Neuropathy    Pre-diabetes    Rhabdomyolysis 02/19/2017   Unilateral primary osteoarthritis, left knee 12/14/2021    Past Surgical History:  Procedure Laterality Date   ANAL FISTULOTOMY  07/27/2023   Procedure: ANAL FISTULOTOMY;  Surgeon: Debby Hila, MD;  Location: [facility];  Service: General;;   FOOT ARTHRODESIS Right 09/17/2019   Procedure: FUSION RIGHT LISFRANC JOINT;  Surgeon: Harden Jerona GAILS, MD;  Location: [facility] OR;  Service: Orthopedics;  Laterality: Right;   FOOT ARTHRODESIS Right 09/2019   HEMORROIDECTOMY     HIGH RESOLUTION ANOSCOPY N/A 07/27/2023   Procedure: ANOSCOPY, HIGH RESOLUTION;  Surgeon: Debby Hila, MD;  Location: [facility];  Service: General;  Laterality: N/A;   IR FLUORO GUIDE CV LINE RIGHT  02/22/2017   IR US  GUIDE VASC ACCESS RIGHT  02/22/2017   RECTAL BIOPSY N/A 07/27/2023   Procedure: BIOPSY AND ABLATION, RECTUM;  Surgeon: Debby Hila, MD;  Location: [facility];  Service: General;  Laterality: N/A;   TOTAL KNEE ARTHROPLASTY Left 12/14/2021   Procedure: LEFT TOTAL KNEE ARTHROPLASTY;  Surgeon: Harden Jerona GAILS, MD;  Location: [facility] OR;  Service: Orthopedics;  Laterality: Left;   TUMOR REMOVAL     From Chest   Social History:  reports that he has been smoking e-cigarettes and cigarettes. He has a 12.5 pack-year smoking history. He has never used smokeless tobacco. He reports current alcohol use. He reports that he does not currently use drugs after having used the following drugs: Cocaine and Marijuana.  Allergies  Allergen Reactions   Penicillins Other (See Comments)  Reaction type/severity unknown, childhood allergy; pt cannot recall all symptoms   Latex Rash   Tape Rash    Family History  Problem Relation Age of Onset   CAD Mother    Diabetes Father    Stroke Father    Colon cancer Maternal Aunt 60   Diabetes Maternal Aunt    Heart disease Maternal Uncle    Heart attack Maternal Uncle    Esophageal cancer Neg Hx    Rectal cancer Neg Hx    Stomach cancer Neg Hx     Prior to Admission medications   Medication Sig  Start Date End Date Taking? Authorizing Provider  acetaminophen  (TYLENOL ) 500 MG tablet Take 1,000 mg by mouth every 6 (six) hours as needed for mild pain (pain score 1-3) or moderate pain (pain score 4-6).   Yes [provider]  bictegravir-emtricitabine -tenofovir  AF (BIKTARVY ) 50-200-25 MG TABS tablet Take 1 tablet by mouth daily. 07/23/23  Yes Luiz Channel, MD  diclofenac  Sodium (VOLTAREN ) 1 % GEL Apply 4 g topically 4 (four) times daily. 06/19/23  Yes Newlin, Enobong, MD  gabapentin  (NEURONTIN ) 300 MG capsule Take 2 capsules (600 mg total) by mouth 2 (two) times daily. 07/07/23  Yes Newlin, Enobong, MD  Multiple Vitamin (MULTIVITAMIN WITH MINERALS) TABS tablet Take 1 tablet by mouth daily.   Yes [provider]  rosuvastatin  (CRESTOR ) 20 MG tablet Take 1 tablet (20 mg total) by mouth daily. 07/23/23  Yes Luiz Channel, MD  traMADol  (ULTRAM ) 50 MG tablet Take 1-2 tablets (50-100 mg total) by mouth every 6 (six) hours as needed. 07/27/23  Yes Debby Hila, MD  traZODone  (DESYREL ) 150 MG tablet Take 150 mg by mouth at bedtime.   Yes [provider]    Physical Exam: Vitals:   08/08/23 0401 08/08/23 0423 08/08/23 0500 08/08/23 0759  BP: 104/88 (!) 114/96  116/78  Pulse: 88 88  77  Resp: 16 18  18   Temp:  98 F (36.7 C)  97.9 F (36.6 C)  TempSrc:  Oral    SpO2: 99% 97%  97%  Weight:   105 kg   Height:      Gen: No distress Pulm: Clear, nonlabored  CV: RRR, no MRG. No JVD. No edema GI: Soft, NT, ND, +BS.  Neuro: Alert and oriented. No new focal deficits. Ext: Warm, no deformities. Skin: No open wounds, lesions or ulcers on visualized skin   Data Reviewed: CK 10,764 Troponin 33 > 31 WBC 11.2k > 7.2k SCr 1.43 (last values were 0.86-1.11), BUN 29. K, bicarb, glucose normal. Na 133. AST 169, ALT 64, TBili 1.2.   Assessment and Plan: Recurrent rhabdomyolysis: Suspected precipitant this episode is outdoor exercise in high temperatures and inadequate oral  hydration. Not taking statin any longer, no new medications.  - Continue high rate IVF, trend CK and Cr.  - Given recurrent episodes - UDS ordered stat. Pt has voided twice this AM since being told we need a specimen. Will check UA w/micro due to AKI and send UDS. With his recurrent episodes, stimulant use is a possibility. He denies substance use of late. Urine cocaine testing has been positive 14 out of 16 times this has been tested since 2013.    - Continue analgesia as ordered for myalgias.   AKI:  - UA w/micro pending - Follow BMP with IVF.  - Avoid contrast, NSAIDs.   HIV disease: Follows reliably at [facility], Dr. Luiz. Last VL undetectable and CD4 > 500.  - Continue biktarvy .  Neuropathy:  - Continue gabapentin   HLD:  - Hold statin with LFT elevation, rhabdo.   History of cocaine use:  - UDS pending, avoid beta blockers.    Advance Care Planning: Full code  Consults: None  Family Communication: None at bedside  Severity of Illness: The appropriate patient status for this patient is OBSERVATION. Observation status is judged to be reasonable and necessary in order to provide the required intensity of service to ensure the patient's safety. The patient's presenting symptoms, physical exam findings, and initial radiographic and laboratory data in the context of their medical condition is felt to place them at decreased risk for further clinical deterioration. Furthermore, it is anticipated that the patient will be medically stable for discharge from the hospital within 2 midnights of admission.   Author: Bernardino KATHEE Come, MD 08/08/2023 9:05 AM  For on call review [email or web address].

## 2023-08-09 DIAGNOSIS — X509XXA Other and unspecified overexertion or strenuous movements or postures, initial encounter: Secondary | ICD-10-CM | POA: Diagnosis not present

## 2023-08-09 DIAGNOSIS — Z8249 Family history of ischemic heart disease and other diseases of the circulatory system: Secondary | ICD-10-CM | POA: Diagnosis not present

## 2023-08-09 DIAGNOSIS — T796XXA Traumatic ischemia of muscle, initial encounter: Secondary | ICD-10-CM

## 2023-08-09 DIAGNOSIS — Z91048 Other nonmedicinal substance allergy status: Secondary | ICD-10-CM | POA: Diagnosis not present

## 2023-08-09 DIAGNOSIS — F17210 Nicotine dependence, cigarettes, uncomplicated: Secondary | ICD-10-CM | POA: Diagnosis present

## 2023-08-09 DIAGNOSIS — R7303 Prediabetes: Secondary | ICD-10-CM | POA: Diagnosis present

## 2023-08-09 DIAGNOSIS — G629 Polyneuropathy, unspecified: Secondary | ICD-10-CM | POA: Diagnosis present

## 2023-08-09 DIAGNOSIS — N179 Acute kidney failure, unspecified: Secondary | ICD-10-CM | POA: Diagnosis present

## 2023-08-09 DIAGNOSIS — F17290 Nicotine dependence, other tobacco product, uncomplicated: Secondary | ICD-10-CM | POA: Diagnosis present

## 2023-08-09 DIAGNOSIS — Z79899 Other long term (current) drug therapy: Secondary | ICD-10-CM | POA: Diagnosis not present

## 2023-08-09 DIAGNOSIS — Z833 Family history of diabetes mellitus: Secondary | ICD-10-CM | POA: Diagnosis not present

## 2023-08-09 DIAGNOSIS — I10 Essential (primary) hypertension: Secondary | ICD-10-CM | POA: Diagnosis present

## 2023-08-09 DIAGNOSIS — Z96652 Presence of left artificial knee joint: Secondary | ICD-10-CM | POA: Diagnosis present

## 2023-08-09 DIAGNOSIS — B20 Human immunodeficiency virus [HIV] disease: Secondary | ICD-10-CM | POA: Diagnosis present

## 2023-08-09 DIAGNOSIS — Z880 Allergy status to penicillin: Secondary | ICD-10-CM | POA: Diagnosis not present

## 2023-08-09 DIAGNOSIS — F1410 Cocaine abuse, uncomplicated: Secondary | ICD-10-CM | POA: Diagnosis present

## 2023-08-09 DIAGNOSIS — Z800 Family history of malignant neoplasm of digestive organs: Secondary | ICD-10-CM | POA: Diagnosis not present

## 2023-08-09 DIAGNOSIS — E785 Hyperlipidemia, unspecified: Secondary | ICD-10-CM | POA: Diagnosis present

## 2023-08-09 DIAGNOSIS — M6282 Rhabdomyolysis: Secondary | ICD-10-CM | POA: Diagnosis present

## 2023-08-09 DIAGNOSIS — Z91040 Latex allergy status: Secondary | ICD-10-CM | POA: Diagnosis not present

## 2023-08-09 DIAGNOSIS — Z823 Family history of stroke: Secondary | ICD-10-CM | POA: Diagnosis not present

## 2023-08-09 DIAGNOSIS — Z860100 Personal history of colon polyps, unspecified: Secondary | ICD-10-CM | POA: Diagnosis not present

## 2023-08-09 DIAGNOSIS — F319 Bipolar disorder, unspecified: Secondary | ICD-10-CM | POA: Diagnosis present

## 2023-08-09 LAB — COMPREHENSIVE METABOLIC PANEL WITH GFR
ALT: 53 U/L — ABNORMAL HIGH (ref 0–44)
AST: 116 U/L — ABNORMAL HIGH (ref 15–41)
Albumin: 2.8 g/dL — ABNORMAL LOW (ref 3.5–5.0)
Alkaline Phosphatase: 59 U/L (ref 38–126)
Anion gap: 5 (ref 5–15)
BUN: 17 mg/dL (ref 6–20)
CO2: 23 mmol/L (ref 22–32)
Calcium: 8 mg/dL — ABNORMAL LOW (ref 8.9–10.3)
Chloride: 109 mmol/L (ref 98–111)
Creatinine, Ser: 1.04 mg/dL (ref 0.61–1.24)
GFR, Estimated: >60 mL/min (ref >60)
Glucose, Bld: 104 mg/dL — ABNORMAL HIGH (ref 70–99)
Potassium: 3.6 mmol/L (ref 3.5–5.1)
Sodium: 137 mmol/L (ref 135–145)
Total Bilirubin: 0.6 mg/dL (ref 0.0–1.2)
Total Protein: 5.5 g/dL — ABNORMAL LOW (ref 6.5–8.1)

## 2023-08-09 LAB — CK
Total CK: 6821 U/L — ABNORMAL HIGH (ref 49–397)
Total CK: 5307 U/L — ABNORMAL HIGH (ref 49–397)

## 2023-08-09 MED ORDER — SODIUM CHLORIDE 0.9 % IV SOLN: INTRAVENOUS | Status: DC

## 2023-08-09 MED ORDER — SODIUM CHLORIDE 0.9 % IV BOLUS
1000.0000 mL | Freq: Once | INTRAVENOUS | Status: AC
  Administered 2023-08-09: 1000 mL via INTRAVENOUS

## 2023-08-09 NOTE — Plan of Care (Signed)

## 2023-08-09 NOTE — Progress Notes (Signed)
 PROGRESS NOTE    Dylan [name]  FMW:[id number] DOB: [birth year] DOA: 08/07/2023 PCP: Delbert Clam, MD    Brief Narrative:  58 year old with history of HIV, neuropathy, recurrent rhabdomyolysis who had required hemodialysis in the past, cocaine use disorder and alcoholism presented with whole body ache, dark urine after working outside in the lawn.  In the emergency room hemodynamically stable.  CK level more than 10, creatinine 1.43, mildly elevated LFTs.  Admitted due to acute rhabdomyolysis.  Subjective: Patient seen and examined.  Currently denies any complaints.  Denies any pain.  Muscle pain has improved.  He still not urinating as frequently he is on IV fluids.   Patient had various questions about why he keeps getting rhabdomyolysis, we discussed about that in details and how to prevent it.   Assessment & Plan:   Acute traumatic rhabdomyolysis: AKI Working outside in the hot sun, using cocaine and marijuana. Patient was treated with IV fluid now on maintenance IV fluid. Urine output is adequate.  Not recorded.  Urine color is improving. Renal functions improving. CK levels downtrending.  Continue IV fluids today and recheck CK levels tomorrow to ensure stabilization before discharging. 1 L additional normal saline today.  AKI: As above.  Normalized.  HIV disease: On Biktarvy .  Well-controlled as per patient.  Continue.  Neuropathy: On gabapentin .  Hyperlipidemia: On statin.  Will hold until rhabdomyolysis improved.  Will consider discontinuing this in the context of recurrent rhabdomyolysis.  Cocaine marijuana use: Discussed and counseled.   DVT prophylaxis: enoxaparin  (LOVENOX ) injection 40 mg Start: 08/08/23 1000 SCDs Start: 08/08/23 0302   Code Status: Full code Family Communication: None Disposition Plan: Status is: Observation The patient will require care spanning > 2 midnights and should be moved to inpatient because: Remains on IV fluids      Consultants:  None  Procedures:  None  Antimicrobials:  None     Objective: Vitals:   08/08/23 2359 08/09/23 0335 08/09/23 0500 08/09/23 0804  BP: 120/79 109/65  107/76  Pulse: 68 61  61  Resp: 20 20  18   Temp: 97.9 F (36.6 C) (!) 97.5 F (36.4 C)  97.6 F (36.4 C)  TempSrc:      SpO2: 98% 97%  97%  Weight:   105 kg   Height:        Intake/Output Summary (Last 24 hours) at 08/09/2023 0818 Last data filed at 08/08/2023 2024 Gross per 24 hour  Intake 2440 ml  Output --  Net 2440 ml   Filed Weights   08/07/23 2242 08/08/23 0500 08/09/23 0500  Weight: 105 kg 105 kg 105 kg    Examination:  General: Looks fairly comfortable.  Pleasant interactive. Cardiovascular: S1-S2 normal.  Regular rate rhythm. Respiratory: Bilateral clear.  No added sounds. Gastrointestinal: Soft.  Nontender. Ext: No deformities and no swelling. Neuro: Alert awake.  No deficits.     Data Reviewed: I have personally reviewed following labs and imaging studies  CBC: Recent Labs  Lab 08/07/23 2311 08/08/23 0823  WBC 11.2* 7.2  NEUTROABS  --  3.9  HGB 15.3 13.6  HCT 44.7 39.9  MCV 89.2 89.3  PLT 226 169   Basic Metabolic Panel: Recent Labs  Lab 08/07/23 2346 08/08/23 0823 08/09/23 0248  NA 133* 136 137  K 4.0 3.9 3.6  CL 101 106 109  CO2 23 22 23   GLUCOSE 90 114* 104*  BUN 29* 26* 17  CREATININE 1.43* 1.19 1.04  CALCIUM  9.2 8.6* 8.0*  MG  --  2.2  --    GFR: Estimated Creatinine Clearance: 100 mL/min (by C-G formula based on SCr of 1.04 mg/dL). Liver Function Tests: Recent Labs  Lab 08/07/23 2346 08/08/23 0823 08/09/23 0248  AST 169* 164* 116*  ALT 64* 62* 53*  ALKPHOS 73 69 59  BILITOT 1.2 0.8 0.6  PROT 7.4 6.1* 5.5*  ALBUMIN 4.1 3.3* 2.8*   No results for input(s): LIPASE, AMYLASE in the last 168 hours. No results for input(s): AMMONIA in the last 168 hours. Coagulation Profile: Recent Labs  Lab 08/08/23 0823  INR 1.0   Cardiac  Enzymes: Recent Labs  Lab 08/07/23 2346 08/08/23 0823 08/09/23 0248  CKTOTAL 10,764* 11,247* 6,821*   BNP (last 3 results) No results for input(s): PROBNP in the last 8760 hours. HbA1C: No results for input(s): HGBA1C in the last 72 hours. CBG: No results for input(s): GLUCAP in the last 168 hours. Lipid Profile: No results for input(s): CHOL, HDL, LDLCALC, TRIG, CHOLHDL, LDLDIRECT in the last 72 hours. Thyroid  Function Tests: No results for input(s): TSH, T4TOTAL, FREET4, T3FREE, THYROIDAB in the last 72 hours. Anemia Panel: No results for input(s): VITAMINB12, FOLATE, FERRITIN, TIBC, IRON , RETICCTPCT in the last 72 hours. Sepsis Labs: No results for input(s): PROCALCITON, LATICACIDVEN in the last 168 hours.  No results found for this or any previous visit (from the past 240 hours).       Radiology Studies: DG Chest Portable 1 View Result Date: 08/07/2023 CLINICAL DATA:  Shortness of breath and chest pain EXAM: PORTABLE CHEST 1 VIEW COMPARISON:  05/15/2023 FINDINGS: Cardiac shadow is within normal limits. The lungs are well aerated bilaterally. No focal infiltrate or effusion is seen. No bony abnormality is noted. IMPRESSION: No active disease. Electronically Signed   By: Oneil Devonshire M.D.   On: 08/07/2023 23:07        Scheduled Meds:  bictegravir-emtricitabine -tenofovir  AF  1 tablet Oral Daily   diclofenac  Sodium  4 g Topical QID   enoxaparin  (LOVENOX ) injection  40 mg Subcutaneous Q24H   gabapentin   600 mg Oral BID   sodium chloride  flush  3 mL Intravenous Q12H   traZODone   150 mg Oral QHS   Continuous Infusions:  sodium chloride  150 mL/hr at 08/09/23 0339     LOS: 0 days    Time spent: 55 minutes    Renato Applebaum, MD [facility]

## 2023-08-10 DIAGNOSIS — T796XXA Traumatic ischemia of muscle, initial encounter: Secondary | ICD-10-CM | POA: Diagnosis not present

## 2023-08-10 DIAGNOSIS — B20 Human immunodeficiency virus [HIV] disease: Secondary | ICD-10-CM | POA: Diagnosis not present

## 2023-08-10 DIAGNOSIS — N179 Acute kidney failure, unspecified: Secondary | ICD-10-CM | POA: Diagnosis not present

## 2023-08-10 LAB — BASIC METABOLIC PANEL WITH GFR
Anion gap: 6 (ref 5–15)
BUN: 11 mg/dL (ref 6–20)
CO2: 21 mmol/L — ABNORMAL LOW (ref 22–32)
Calcium: 8.5 mg/dL — ABNORMAL LOW (ref 8.9–10.3)
Chloride: 111 mmol/L (ref 98–111)
Creatinine, Ser: 1 mg/dL (ref 0.61–1.24)
GFR, Estimated: >60 mL/min (ref >60)
Glucose, Bld: 105 mg/dL — ABNORMAL HIGH (ref 70–99)
Potassium: 3.9 mmol/L (ref 3.5–5.1)
Sodium: 138 mmol/L (ref 135–145)

## 2023-08-10 LAB — CK: Total CK: 3349 U/L — ABNORMAL HIGH (ref 49–397)

## 2023-08-10 MED ORDER — SODIUM CHLORIDE 0.9 % IV BOLUS
1000.0000 mL | Freq: Once | INTRAVENOUS | Status: AC
  Administered 2023-08-10: 1000 mL via INTRAVENOUS

## 2023-08-10 MED ORDER — SODIUM CHLORIDE 0.9 % IV SOLN: INTRAVENOUS | Status: DC

## 2023-08-10 NOTE — Plan of Care (Signed)

## 2023-08-10 NOTE — Plan of Care (Signed)

## 2023-08-10 NOTE — TOC Progression Note (Signed)
 Transition of Care [facility]) - Progression Note    Patient Details  Name: Dylan [name] MRN: [id number] Date of Birth: 02-22-1965  [facility] CM/SW Contact  Izaiah Tabb A [place], LCSW Phone Number: 08/10/2023, 9:46 AM  Clinical Narrative:     CSW reached out to [facility] [phone] and left VM with staff to reach back out to CSW.   CSW all contacted [employer] to set up transportation. CSW unable, met with pt at bedside. He stated he would reach out and attempt to scheduled ride for estimated DC date of tomorrow.   CSW to follow up about pt's success of setting up transport.                       Expected Discharge Plan and Services                                               Social Drivers of Health (SDOH) Interventions SDOH Screenings   Food Insecurity: Food Insecurity Present (08/08/2023)  Housing: Low Risk  (08/08/2023)  Transportation Needs: Unmet Transportation Needs (08/08/2023)  Utilities: Not At Risk (08/08/2023)  Alcohol Screen: Low Risk  (03/10/2023)  Depression (PHQ2-9): Low Risk  (07/23/2023)  Financial Resource Strain: Medium Risk (06/24/2023)  Physical Activity: Insufficiently Active (06/24/2023)  Social Connections: Moderately Integrated (06/24/2023)  Stress: Stress Concern Present (06/24/2023)  Tobacco Use: High Risk (08/08/2023)    Readmission Risk Interventions    06/05/2023    3:36 PM 01/08/2023   12:34 PM  Readmission Risk Prevention Plan  Transportation Screening Complete   Medication Review (RN Care Manager) Complete   PCP or Specialist appointment within 3-5 days of discharge Complete   HRI or Home Care Consult Complete   SW Recovery Care/Counseling Consult    Palliative Care Screening Not Applicable   Skilled Nursing Facility Not Applicable      Information is confidential and restricted. Go to Review Flowsheets to unlock data.

## 2023-08-10 NOTE — Progress Notes (Signed)
 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  In the emergency room hemodynamically stable.  CK level more than 10, creatinine 1.43, mildly elevated LFTs.  Admitted due to acute rhabdomyolysis.  Subjective: Patient seen and examined.  No overnight events.  He has mild soreness both thighs but denies any other complaints.   Assessment & Plan:   Acute traumatic rhabdomyolysis: AKI Working outside in the hot sun, using cocaine and marijuana. Patient was treated with IV fluid now on maintenance IV fluid. CK levels trending down.  Urine output not well-documented.  Urine color is improving.  Renal functions are normal. Additional 1 L normal saline today and then continue maintenance IV fluids.  Check CK levels tomorrow morning.  AKI: As above.  Normalized.  HIV disease: On Biktarvy .  Well-controlled as per patient.  Continue.  Not likely to cause rhabdomyolysis.  Neuropathy: On gabapentin .  Continued.  Hyperlipidemia: Recently discontinued statins.  He is following up with cardiology office for Repatha injection.  Cocaine marijuana use: Discussed and counseled.   DVT prophylaxis: enoxaparin  (LOVENOX ) injection 40 mg Start: 08/08/23 1000 SCDs Start: 08/08/23 0302   Code Status: Full code Family Communication: None Disposition Plan: Status is: Inpatient.  Remains on IV fluids.     Consultants:  None  Procedures:  None  Antimicrobials:  None     Objective: Vitals:   08/09/23 2020 08/09/23 2355 08/10/23 0431 08/10/23 0739  BP: 138/88 127/79 101/73 (!) 120/91  Pulse: 71 76 61 76  Resp: 17 17 17    Temp: 97.8 F (36.6 C) 97.8 F (36.6 C) 97.7 F (36.5 C) 97.9 F  (36.6 C)  TempSrc:      SpO2: 100% 100% 98% 100%  Weight:      Height:        Intake/Output Summary (Last 24 hours) at 08/10/2023 1055 Last data filed at 08/10/2023 0204 Gross per 24 hour  Intake 1144.37 ml  Output --  Net 1144.37 ml   Filed Weights   08/07/23 2242 08/08/23 0500 08/09/23 0500  Weight: 105 kg 105 kg 105 kg    Examination:  General: Pleasant and interactive. Cardiovascular: S1-S2 normal.  Regular rate rhythm. Respiratory: Bilateral clear.  No added sounds. Gastrointestinal: Soft.  Nontender. Ext: No deformities and no swelling. Neuro: Alert awake.  No deficits.     Data Reviewed: I have personally reviewed following labs and imaging studies  CBC: Recent Labs  Lab 08/07/23 2311 08/08/23 0823  WBC 11.2* 7.2  NEUTROABS  --  3.9  HGB 15.3 13.6  HCT 44.7 39.9  MCV 89.2 89.3  PLT 226 169   Basic Metabolic Panel: Recent Labs  Lab 08/07/23 2346 08/08/23 0823 08/09/23 0248 08/10/23 0537  NA 133* 136 137 138  K 4.0 3.9 3.6 3.9  CL 101 106 109 111  CO2 23 22 23  21*  GLUCOSE 90 114* 104* 105*  BUN 29* 26* 17 11  CREATININE 1.43* 1.19 1.04 1.00  CALCIUM  9.2 8.6* 8.0* 8.5*  MG  --  2.2  --   --    GFR: Estimated Creatinine Clearance: 104 mL/min (by C-G formula based on SCr of 1 mg/dL). Liver Function Tests: Recent Labs  Lab 08/07/23 2346 08/08/23 0823 08/09/23  0248  AST 169* 164* 116*  ALT 64* 62* 53*  ALKPHOS 73 69 59  BILITOT 1.2 0.8 0.6  PROT 7.4 6.1* 5.5*  ALBUMIN 4.1 3.3* 2.8*   No results for input(s): LIPASE, AMYLASE in the last 168 hours. No results for input(s): AMMONIA in the last 168 hours. Coagulation Profile: Recent Labs  Lab 08/08/23 0823  INR 1.0   Cardiac Enzymes: Recent Labs  Lab 08/07/23 2346 08/08/23 0823 08/09/23 0248 08/09/23 1721 08/10/23 0537  CKTOTAL 10,764* 11,247* 6,821* 5,307* 3,349*   BNP (last 3 results) No results for input(s): PROBNP in the last 8760 hours. HbA1C: No results for  input(s): HGBA1C in the last 72 hours. CBG: No results for input(s): GLUCAP in the last 168 hours. Lipid Profile: No results for input(s): CHOL, HDL, LDLCALC, TRIG, CHOLHDL, LDLDIRECT in the last 72 hours. Thyroid  Function Tests: No results for input(s): TSH, T4TOTAL, FREET4, T3FREE, THYROIDAB in the last 72 hours. Anemia Panel: No results for input(s): VITAMINB12, FOLATE, FERRITIN, TIBC, IRON , RETICCTPCT in the last 72 hours. Sepsis Labs: No results for input(s): PROCALCITON, LATICACIDVEN in the last 168 hours.  No results found for this or any previous visit (from the past 240 hours).       Radiology Studies: No results found.       Scheduled Meds:  bictegravir-emtricitabine -tenofovir  AF  1 tablet Oral Daily   diclofenac  Sodium  4 g Topical QID   enoxaparin  (LOVENOX ) injection  40 mg Subcutaneous Q24H   gabapentin   600 mg Oral BID   sodium chloride  flush  3 mL Intravenous Q12H   traZODone   150 mg Oral QHS   Continuous Infusions:  sodium chloride        LOS: 1 day    Time spent: 40 minutes    Renato Applebaum, MD [facility]

## 2023-08-11 DIAGNOSIS — N179 Acute kidney failure, unspecified: Secondary | ICD-10-CM | POA: Diagnosis not present

## 2023-08-11 DIAGNOSIS — T796XXA Traumatic ischemia of muscle, initial encounter: Secondary | ICD-10-CM | POA: Diagnosis not present

## 2023-08-11 LAB — CK: Total CK: 2409 U/L — ABNORMAL HIGH (ref 49–397)

## 2023-08-11 NOTE — Progress Notes (Signed)
 Review of AVS and discharge instruction completed.  Patient expressed verbal understanding of care plan.  PIV removed.  Dressing intact. Transferred to [facility]ounge.  Per CM taxi voucher needed.  [facility]ounge notified.

## 2023-08-11 NOTE — Discharge Summary (Signed)
 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 In the emergency room hemodynamically stable. CK level more than 10000, creatinine 1.43, mildly elevated LFTs. Admitted due to acute rhabdomyolysis.   Acute traumatic rhabdomyolysis: AKI Working outside in the hot sun, using cocaine and marijuana. Patient was treated with large dose of IV fluids with good clinical recovery.  CK level trending down.  It has not normalized yet, however his renal functions are normal and he is making good urine.  Symptomatically improved.  Would like to go home.   Recommended good hydration habits, not to use cocaine and marijuana.   Crestor  was discontinued on last admission.   Stable to discharge home. Resume Repatha.   Discharge Diagnoses:  Active Problems:   Rhabdomyolysis   Essential hypertension   HIV disease (HCC)   Dyslipidemia   Bipolar disorder (HCC)   Polysubstance abuse (HCC)   Elevated LFTs   AKI (acute kidney injury) (HCC)   Tobacco use disorder    Discharge Instructions  Discharge Instructions     Diet general   Complete by: As directed    Drink plenty of water  ,   Increase activity slowly   Complete by: As directed       Allergies as of 08/11/2023       Reactions   Penicillins Other (See Comments)   Reaction type/severity unknown, childhood allergy; pt  cannot recall all symptoms   Latex Rash   Tape Rash        Medication List     STOP taking these medications    rosuvastatin  20 MG tablet Commonly known as: Crestor        TAKE these medications    acetaminophen  500 MG tablet Commonly known as: TYLENOL  Take 1,000 mg by mouth every 6 (six) hours as needed for mild pain (pain score 1-3) or moderate pain (pain score 4-6).   bictegravir-emtricitabine -tenofovir  AF 50-200-25 MG Tabs tablet Commonly known as: BIKTARVY  Take 1 tablet by mouth daily.   diclofenac  Sodium 1 % Gel Commonly known as: Voltaren  Apply 4 g topically 4 (four) times daily.   gabapentin  300 MG capsule Commonly known as: NEURONTIN  Take 2 capsules (600 mg total) by mouth 2 (two) times daily.   multivitamin with minerals Tabs tablet Take 1 tablet by mouth daily.   traMADol  50 MG tablet Commonly known as: ULTRAM  Take 1-2 tablets (50-100 mg total) by mouth every 6 (six) hours as needed.   traZODone  150 MG tablet Commonly known as: DESYREL  Take 150 mg by mouth at bedtime.        Allergies  Allergen Reactions   Penicillins Other (See Comments)    Reaction type/severity unknown, childhood allergy; pt cannot recall all symptoms   Latex Rash   Tape Rash    Consultations: None   Procedures/Studies: DG Chest Portable 1 View Result Date: 08/07/2023 CLINICAL DATA:  Shortness of breath and chest pain EXAM: PORTABLE CHEST 1 VIEW COMPARISON:  05/15/2023 FINDINGS: Cardiac shadow is  within normal limits. The lungs are well aerated bilaterally. No focal infiltrate or effusion is seen. No bony abnormality is noted. IMPRESSION: No active disease. Electronically Signed   By: Oneil Devonshire M.D.   On: 08/07/2023 23:07   (Echo, Carotid, EGD, Colonoscopy, ERCP)    Subjective: Seen in the morning rounds.  Denies any complaints.  Wants to go home.   Discharge Exam: Vitals:   08/10/23 2008 08/11/23 0418  BP:  94/61  Pulse: 71 61  Resp: 17 17  Temp: 98 F  (36.7 C) 97.7 F (36.5 C)  SpO2: 100% 96%   Vitals:   08/10/23 1606 08/10/23 2008 08/11/23 0418 08/11/23 0500  BP: 124/75 133/81 94/61   Pulse: 71 71 61   Resp: 18 17 17    Temp: 98.6 F (37 C) 98 F (36.7 C) 97.7 F (36.5 C)   TempSrc: Oral  Oral   SpO2:  100% 96%   Weight:    105.2 kg  Height:        General: Pt is alert, awake, not in acute distress Cardiovascular: RRR, S1/S2 +, no rubs, no gallops Respiratory: CTA bilaterally, no wheezing, no rhonchi Abdominal: Soft, NT, ND, bowel sounds + Extremities: no edema, no cyanosis    The results of significant diagnostics from this hospitalization (including imaging, microbiology, ancillary and laboratory) are listed below for reference.     Microbiology: No results found for this or any previous visit (from the past 240 hours).   Labs: BNP (last 3 results) No results for input(s): BNP in the last 8760 hours. Basic Metabolic Panel: Recent Labs  Lab 08/07/23 2346 08/08/23 0823 08/09/23 0248 08/10/23 0537  NA 133* 136 137 138  K 4.0 3.9 3.6 3.9  CL 101 106 109 111  CO2 23 22 23  21*  GLUCOSE 90 114* 104* 105*  BUN 29* 26* 17 11  CREATININE 1.43* 1.19 1.04 1.00  CALCIUM  9.2 8.6* 8.0* 8.5*  MG  --  2.2  --   --    Liver Function Tests: Recent Labs  Lab 08/07/23 2346 08/08/23 0823 08/09/23 0248  AST 169* 164* 116*  ALT 64* 62* 53*  ALKPHOS 73 69 59  BILITOT 1.2 0.8 0.6  PROT 7.4 6.1* 5.5*  ALBUMIN 4.1 3.3* 2.8*   No results for input(s): LIPASE, AMYLASE in the last 168 hours. No results for input(s): AMMONIA in the last 168 hours. CBC: Recent Labs  Lab 08/07/23 2311 08/08/23 0823  WBC 11.2* 7.2  NEUTROABS  --  3.9  HGB 15.3 13.6  HCT 44.7 39.9  MCV 89.2 89.3  PLT 226 169   Cardiac Enzymes: Recent Labs  Lab 08/08/23 0823 08/09/23 0248 08/09/23 1721 08/10/23 0537 08/11/23 0207  CKTOTAL 11,247* 6,821* 5,307* 3,349* 2,409*   BNP: Invalid input(s): POCBNP CBG: No results for  input(s): GLUCAP in the last 168 hours. D-Dimer No results for input(s): DDIMER in the last 72 hours. Hgb A1c No results for input(s): HGBA1C in the last 72 hours. Lipid Profile No results for input(s): CHOL, HDL, LDLCALC, TRIG, CHOLHDL, LDLDIRECT in the last 72 hours. Thyroid  function studies No results for input(s): TSH, T4TOTAL, T3FREE, THYROIDAB in the last 72 hours.  Invalid input(s): FREET3 Anemia work up No results for input(s): VITAMINB12, FOLATE, FERRITIN, TIBC, IRON , RETICCTPCT in the last 72 hours. Urinalysis    Component Value Date/Time   COLORURINE YELLOW 08/08/2023 0636   APPEARANCEUR CLEAR 08/08/2023 0636   LABSPEC 1.030 08/08/2023 0636   PHURINE 5.0 08/08/2023 0636  GLUCOSEU NEGATIVE 08/08/2023 0636   HGBUR NEGATIVE 08/08/2023 0636   BILIRUBINUR NEGATIVE 08/08/2023 0636   KETONESUR NEGATIVE 08/08/2023 0636   PROTEINUR NEGATIVE 08/08/2023 0636   UROBILINOGEN 0.2 03/06/2020 1149   NITRITE NEGATIVE 08/08/2023 0636   LEUKOCYTESUR NEGATIVE 08/08/2023 0636   Sepsis Labs Recent Labs  Lab 08/07/23 2311 08/08/23 0823  WBC 11.2* 7.2   Microbiology No results found for this or any previous visit (from the past 240 hours).   Time coordinating discharge: 35 minutes  SIGNED:   Renato Applebaum, MD  [facility] 08/11/2023, 8:57 AM

## 2023-08-11 NOTE — Plan of Care (Signed)

## 2023-08-13 ENCOUNTER — Telehealth: Payer: Self-pay

## 2023-08-13 NOTE — Transitions of Care (Post Inpatient/ED Visit) (Signed)
   08/13/2023  Name: Dylan [name] MRN: [id number] DOB: 22-Jun-1965  Today's TOC FU Call Status: Today's TOC FU Call Status:: Unsuccessful Call (1st Attempt) Unsuccessful Call (1st Attempt) Date: 08/13/23  Attempted to reach the patient regarding the most recent Inpatient/ED visit.  Follow Up Plan: Additional outreach attempts will be made to reach the patient to complete the Transitions of Care (Post Inpatient/ED visit) call.   Signature  Slater Diesel, RN

## 2023-08-14 ENCOUNTER — Telehealth: Payer: Self-pay

## 2023-08-14 NOTE — Transitions of Care (Post Inpatient/ED Visit) (Signed)
   08/14/2023  Name: Dylan [name] MRN: [id number] DOB: 1965/10/19  Today's TOC FU Call Status: Today's TOC FU Call Status:: Unsuccessful Call (2nd Attempt) Unsuccessful Call (1st Attempt) Date: 08/13/23 Unsuccessful Call (2nd Attempt) Date: 08/14/23  Attempted to reach the patient regarding the most recent Inpatient/ED visit.  Follow Up Plan: Additional outreach attempts will be made to reach the patient to complete the Transitions of Care (Post Inpatient/ED visit) call.   Signature  Slater Diesel, RN

## 2023-08-15 ENCOUNTER — Telehealth: Payer: Self-pay

## 2023-08-15 NOTE — Transitions of Care (Post Inpatient/ED Visit) (Signed)
   08/15/2023  Name: Dylan [name] MRN: [id number] DOB: 1965/11/25  Today's TOC FU Call Status: Today's TOC FU Call Status:: Unsuccessful Call (3rd Attempt) Unsuccessful Call (1st Attempt) Date: 08/13/23 Unsuccessful Call (2nd Attempt) Date: 08/14/23 Unsuccessful Call (3rd Attempt) Date: 08/15/23  Attempted to reach the patient regarding the most recent Inpatient/ED visit.  Follow Up Plan: No further outreach attempts will be made at this time. We have been unable to contact the patient.  Letter sent to patient requesting he contact [facility] to schedule a follow up appointment as we have not been able to reach him   Signature Slater Diesel, RN

## 2023-08-17 LAB — GENECONNECT MOLECULAR SCREEN: Genetic Analysis Overall Interpretation: NEGATIVE

## 2023-08-21 ENCOUNTER — Ambulatory Visit: Payer: Self-pay | Admitting: General Surgery

## 2023-08-21 NOTE — Progress Notes (Signed)
   PROVIDER:  ALICIA CHRISTINE THOMAS, MD  MRN: [id number] DOB: 12/13/1965 DATE OF ENCOUNTER: 08/21/2023 Interval History:   58 year old male with low-grade AIN seen on anal Pap test.  He underwent high-resolution anoscopy and was noted to have 2 anal lesions.  1 was situated within a fistula track and the other was noted in the right anterior anal canal.  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  Physical Examination:   Gen: NAD Abd: soft Incision: recurrence noted at fistulotomy site  Assessment and Plan:   Dylan [name] is a 58 y.o. male who underwent resolution anoscopy and biopsy.  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  I recommended return to the OR with laser ablation and biopsy.  This was discussed in detail.  Risk include bleeding pain and recurrence.  All questions were answered.  Diagnoses and all orders for this visit:  AIN grade III     No follow-ups on file.   The plan was discussed in detail with the patient today, who expressed understanding.  The patient has my contact information, and understands to call me with any additional questions or concerns in the interval.  I would be happy to see the patient back sooner if the need arises.   Bernarda JAYSON Ned, MD Colon and Rectal Surgery [facility]

## 2023-08-23 NOTE — Patient Instructions (Signed)
 SURGICAL WAITING ROOM VISITATION Patients having surgery or a procedure may have no more than 2 support people in the waiting area - these visitors may rotate in the visitor waiting room.   Due to an increase in RSV and influenza rates and associated hospitalizations, children ages 19 and under may not visit patients in [facility] hospitals. If the patient needs to stay at the hospital during part of their recovery, the visitor guidelines for inpatient rooms apply.  PRE-OP  VISITATION  Pre-op  nurse will coordinate an appropriate time for 1 support person to accompany the patient in pre-op .  This support person may not rotate.  This visitor will be contacted when the time is appropriate for the visitor to come back in the pre-op  area.  Please refer to the [facility] website for the visitor guidelines for Inpatients (after your surgery is over and you are in a regular room).  You are not required to quarantine at this time prior to your surgery. However, you must do this: Hand Hygiene often Do NOT share personal items Notify your provider if you are in close contact with someone who has COVID or you develop fever 100.4 or greater, new onset of sneezing, cough, sore throat, shortness of breath or body aches.  If you test positive for Covid or have been in contact with anyone that has tested positive in the last 10 days please notify you surgeon.    Your procedure is scheduled on:  09/05/23  Report to [facility] Main Entrance: [place] entrance where the [employer] is available.   Report to admitting at: 11:15 AM  Call this number if you have any questions or problems the morning of surgery [phone]  FOLLOW ANY ADDITIONAL PRE OP INSTRUCTIONS YOU RECEIVED FROM YOUR SURGEON'S OFFICE!!!  Do not eat food or drink after Midnight the night prior to your surgery/procedure.   Oral Hygiene is also important to reduce your risk of infection.        Remember - BRUSH YOUR TEETH THE  MORNING OF SURGERY WITH YOUR REGULAR TOOTHPASTE  Do NOT smoke after Midnight the night before surgery.  STOP TAKING all Vitamins, Herbs and supplements 1 week before your surgery.   Take ONLY these medicines the morning of surgery with A SIP OF WATER : gabapentin ,bictegravir.  If You have been diagnosed with Sleep Apnea - Bring CPAP mask and tubing day of surgery. We will provide you with a CPAP machine on the day of your surgery.                   You may not have any metal on your body including hair pins, jewelry, and body piercing  Do not wear lotions, powders, perfumes / cologne, or deodorant   Men may shave face and neck.  Contacts, Hearing Aids, dentures or bridgework may not be worn into surgery. DENTURES WILL BE REMOVED PRIOR TO SURGERY PLEASE DO NOT APPLY Poly grip OR ADHESIVES!!!  You may bring a small overnight bag with you on the day of surgery, only pack items that are not valuable. [place] IS NOT RESPONSIBLE   FOR VALUABLES THAT ARE LOST OR STOLEN.   Patients discharged on the day of surgery will not be allowed to drive home.  Someone NEEDS to stay with you for the first 24 hours after anesthesia.  Do not bring your home medications to the hospital. The Pharmacy will dispense medications listed on your medication list to you during your admission in the  Hospital.  Special Instructions: Bring a copy of your healthcare power of attorney and living will documents the day of surgery, if you wish to have them scanned into your [facility] Records- EPIC  Please read over the following fact sheets you were given: IF YOU HAVE QUESTIONS ABOUT YOUR PRE-OP  INSTRUCTIONS, PLEASE CALL [phone]   [facility] - Preparing for Surgery Before surgery, you can play an important role.  Because skin is not sterile, your skin needs to be as free of germs as possible.  You can reduce the number of germs on your skin by washing with CHG (chlorahexidine gluconate) soap before  surgery.  CHG is an antiseptic cleaner which kills germs and bonds with the skin to continue killing germs even after washing. Please DO NOT use if you have an allergy to CHG or antibacterial soaps.  If your skin becomes reddened/irritated stop using the CHG and inform your nurse when you arrive at Short Stay. Do not shave (including legs and underarms) for at least 48 hours prior to the first CHG shower.  You may shave your face/neck.  Please follow these instructions carefully:  1.  Shower with CHG Soap the night before surgery and the  morning of surgery.  2.  If you choose to wash your hair, wash your hair first as usual with your normal  shampoo.  3.  After you shampoo, rinse your hair and body thoroughly to remove the shampoo.                             4.  Use CHG as you would any other liquid soap.  You can apply chg directly to the skin and wash.  Gently with a scrungie or clean washcloth.  5.  Apply the CHG Soap to your body ONLY FROM THE NECK DOWN.   Do not use on face/ open                           Wound or open sores. Avoid contact with eyes, ears mouth and genitals (private parts).                       Wash face,  Genitals (private parts) with your normal soap.             6.  Wash thoroughly, paying special attention to the area where your  surgery  will be performed.  7.  Thoroughly rinse your body with warm water  from the neck down.  8.  DO NOT shower/wash with your normal soap after using and rinsing off the CHG Soap.            9.  Pat yourself dry with a clean towel.            10.  Wear clean pajamas.            11.  Place clean sheets on your bed the night of your first shower and do not  sleep with pets.  ON THE DAY OF SURGERY : Do not apply any lotions/deodorants the morning of surgery.  Please wear clean clothes to the hospital/surgery center.     FAILURE TO FOLLOW THESE INSTRUCTIONS MAY RESULT IN THE CANCELLATION OF YOUR SURGERY  PATIENT  SIGNATURE_________________________________  NURSE SIGNATURE__________________________________  ________________________________________________________________________

## 2023-08-27 ENCOUNTER — Encounter (HOSPITAL_COMMUNITY)
Admission: RE | Admit: 2023-08-27 | Discharge: 2023-08-27 | Disposition: A | Discharge status: Home / Self Care | Payer: MEDICAID | Source: Ambulatory Visit | Attending: Anesthesiology | Admitting: Anesthesiology

## 2023-08-27 DIAGNOSIS — I10 Essential (primary) hypertension: Secondary | ICD-10-CM

## 2023-08-30 ENCOUNTER — Other Ambulatory Visit: Payer: Self-pay

## 2023-08-30 ENCOUNTER — Encounter (HOSPITAL_COMMUNITY): Payer: Self-pay

## 2023-08-30 ENCOUNTER — Encounter (HOSPITAL_COMMUNITY)
Admission: RE | Admit: 2023-08-30 | Discharge: 2023-08-30 | Disposition: A | Discharge status: Home / Self Care | Payer: MEDICAID | Source: Ambulatory Visit | Attending: General Surgery | Admitting: General Surgery

## 2023-08-30 DIAGNOSIS — I10 Essential (primary) hypertension: Secondary | ICD-10-CM | POA: Diagnosis not present

## 2023-08-30 DIAGNOSIS — Z01812 Encounter for preprocedural laboratory examination: Secondary | ICD-10-CM | POA: Diagnosis present

## 2023-08-30 DIAGNOSIS — D013 Carcinoma in situ of anus and anal canal: Secondary | ICD-10-CM | POA: Diagnosis not present

## 2023-08-30 DIAGNOSIS — R7303 Prediabetes: Secondary | ICD-10-CM | POA: Diagnosis not present

## 2023-08-30 HISTORY — DX: Acute kidney failure, unspecified: N17.9

## 2023-08-30 LAB — CBC
HCT: 45.9 % (ref 39.0–52.0)
Hemoglobin: 14.7 g/dL (ref 13.0–17.0)
MCH: 29.5 pg (ref 26.0–34.0)
MCHC: 32 g/dL (ref 30.0–36.0)
MCV: 92 fL (ref 80.0–100.0)
Platelets: 233 K/uL (ref 150–400)
RBC: 4.99 MIL/uL (ref 4.22–5.81)
RDW: 13.9 % (ref 11.5–15.5)
WBC: 7 K/uL (ref 4.0–10.5)
nRBC: 0 % (ref 0.0–0.2)

## 2023-08-30 NOTE — Progress Notes (Signed)
 For Anesthesia: PCP - Delbert Clam, MD  Cardiologist - N/A  Bowel Prep reminder:  Chest x-ray - 08/07/23 EKG - 08/08/23 Stress Test -  ECHO - 06/02/22 Cardiac Cath -  Pacemaker/ICD device last checked: Pacemaker orders received: Device Rep notified:  Spinal Cord Stimulator:N/A  Sleep Study - N/A CPAP -   Fasting Blood Sugar - N/A Checks Blood Sugar _____ times a day Date and result of last Hgb A1c-  Last dose of GLP1 agonist- N/A GLP1 instructions:   Last dose of SGLT-2 inhibitors- N/A SGLT-2 instructions:   Blood Thinner Instructions:N/A Aspirin  Instructions: Last Dose:  Activity level: Can go up a flight of stairs and activities of daily living without stopping and without chest pain and/or shortness of breath   Able to exercise without chest pain and/or shortness of breat   Anesthesia review: Hx: Smoker,HIV,Pre-DIA,Rhabdomyolysis (adm: 08/07/23 to 08/11/23)  Patient denies shortness of breath, fever, cough and chest pain at PAT appointment   Patient verbalized understanding of instructions that were reviewed over the telephone.

## 2023-09-04 ENCOUNTER — Encounter (HOSPITAL_COMMUNITY): Payer: Self-pay

## 2023-09-04 NOTE — Progress Notes (Signed)
 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  HIV+. Follows with ID. Last seen on 07/23/23. Takes Biktarvy . Viral load undetectable and CD4 count 704 on 7/14 labs.  Patient has had multiple behavioral health admissions and visits this year for depression and SI. Last admission in May 2025 at [facility].   Patient had anoscopy with biopsy on 7/18 at [facility] surgery center under [facility]. No complications noted. 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  Patient with multiple admissions for non traumatic rhabdo in the setting of working outside and becoming dehydrated. Last admission was from 7/30-8/2. He was treated with IVF and clinically improved.     VS: BP 125/88   Pulse 67   Temp 37 C (Oral)   Ht 6' 2 (1.88 m)   Wt 105.7 kg   SpO2 98%   BMI 29.92 kg/m   PROVIDERS: Delbert Clam, MD   LABS: Labs reviewed: Acceptable for surgery. (all labs ordered are listed, but only abnormal results are displayed)  Labs Reviewed  CBC     IMAGES:  CXR 08/07/23:  FINDINGS: Cardiac shadow is within normal limits. The lungs are well aerated bilaterally. No focal infiltrate or effusion is seen. No bony abnormality is noted.   IMPRESSION: No active disease.  EKG 07/11/23:  Normal sinus rhythm, rate 84 Minimal voltage criteria for LVH, may be normal variant ( Sokolow-Lyon )  CV:  Echo 06/02/22:  IMPRESSIONS    1. Left ventricular ejection fraction, by estimation, is 55 to 60%. The left ventricle has normal  function. The left ventricle has no regional wall motion abnormalities. Left ventricular diastolic parameters are consistent with Grade I diastolic dysfunction (impaired relaxation).  2. Right ventricular systolic function is normal. The right ventricular size is normal. Tricuspid regurgitation signal is inadequate for assessing PA pressure.  3. The mitral valve is normal in structure. No evidence of mitral valve regurgitation. No evidence of mitral stenosis.  4. The aortic valve is tricuspid. There is mild calcification of the aortic valve. Aortic valve regurgitation is not visualized. No aortic stenosis is present.  5. The inferior vena cava is normal in size with greater than 50% respiratory variability, suggesting right atrial pressure of 3 mmHg. Past Medical History:  Diagnosis Date   Acute kidney failure (HCC)    Anxiety    Arthritis    Bipolar 1 disorder (HCC)    Colon polyps    Depression    GERD (gastroesophageal reflux disease)    Hepatitis B    Human immunodeficiency virus (HIV) (HCC)    Hyperlipidemia    Insomnia due to other mental disorder 02/03/2020   Intentional drug overdose (HCC) 11/26/2021   Neuromuscular disorder (HCC)    neuropathy   Neuropathy    Pre-diabetes    Rhabdomyolysis 02/19/2017   Unilateral primary osteoarthritis, left knee 12/14/2021    Past Surgical History:  Procedure Laterality Date   ANAL FISTULOTOMY  07/27/2023   Procedure: ANAL FISTULOTOMY;  Surgeon: Debby Hila, MD;  Location: [facility];  Service: General;;   COLONOSCOPY     FOOT ARTHRODESIS Right 09/17/2019   Procedure: FUSION RIGHT LISFRANC JOINT;  Surgeon: Harden Jerona GAILS, MD;  Location: [facility] OR;  Service: Orthopedics;  Laterality: Right;   FOOT ARTHRODESIS Right 09/2019   HEMORROIDECTOMY     HIGH RESOLUTION ANOSCOPY N/A 07/27/2023   Procedure: ANOSCOPY, HIGH RESOLUTION;  Surgeon: Debby Hila, MD;  Location: [facility];  Service: General;   Laterality: N/A;   IR FLUORO GUIDE CV LINE RIGHT  02/22/2017   IR US  GUIDE VASC ACCESS RIGHT  02/22/2017   RECTAL BIOPSY N/A 07/27/2023   Procedure: BIOPSY AND ABLATION, RECTUM;  Surgeon: Debby Hila, MD;  Location: [facility];  Service: General;  Laterality: N/A;   TOTAL KNEE ARTHROPLASTY Left 12/14/2021   Procedure: LEFT TOTAL KNEE ARTHROPLASTY;  Surgeon: Harden Jerona GAILS, MD;  [facility];  Service: Orthopedics;  Laterality: Left;   TUMOR REMOVAL     From Chest    MEDICATIONS:  acetaminophen  (TYLENOL ) 500 MG tablet   bictegravir-emtricitabine -tenofovir  AF (BIKTARVY ) 50-200-25 MG TABS tablet   diclofenac  Sodium (VOLTAREN ) 1 % GEL   gabapentin  (NEURONTIN ) 300 MG capsule   hydrOXYzine  (VISTARIL ) 25 MG capsule   Multiple Vitamin (MULTIVITAMIN WITH MINERALS) TABS tablet   QUEtiapine  Fumarate 150 MG TABS   traMADol  (ULTRAM ) 50 MG tablet   traZODone  (DESYREL ) 150 MG tablet   No current facility-administered medications for this encounter.   Burnard CHRISTELLA Odis DEVONNA [facility] Short Stay/Anesthesiology [facility] Phone [phone] 09/04/2023 8:52 AM

## 2023-09-04 NOTE — Anesthesia Preprocedure Evaluation (Signed)
 Anesthesia Evaluation    Reviewed: Allergy & Precautions, Patient's Chart, lab work & pertinent test results  History of Anesthesia Complications Negative for: history of anesthetic complications  Airway        Dental   Pulmonary Current Smoker and Patient abstained from smoking.          Cardiovascular hypertension, + Past MI       Neuro/Psych  PSYCHIATRIC DISORDERS Anxiety Depression Bipolar Disorder    Hx intentional drug OD Schizoaffective d/o  Neuromuscular disease    GI/Hepatic ,GERD  ,,(+)     substance abuse  alcohol use, cocaine use, marijuana use and methamphetamine use, Hepatitis -, B  Endo/Other   Pre-DM   Renal/GU negative Renal ROS     Musculoskeletal  (+) Arthritis ,    Abdominal   Peds  Hematology  (+) HIV  Anesthesia Other Findings   Reproductive/Obstetrics                              Anesthesia Physical Anesthesia Plan  ASA: 3  Anesthesia Plan: General   Post-op Pain Management: Tylenol  PO (pre-op )*   Induction: Intravenous  PONV Risk Score and Plan: 2 and Treatment may vary due to age or medical condition, Ondansetron  and Midazolam   Airway Management Planned: LMA  Additional Equipment: None  Intra-op Plan:   Post-operative Plan: Extubation in OR  Informed Consent:   Plan Discussed with: CRNA and Anesthesiologist  Anesthesia Plan Comments: (See PAT note from 8/21)         Anesthesia Quick Evaluation

## 2023-09-05 ENCOUNTER — Encounter (HOSPITAL_COMMUNITY): Admission: RE | Payer: Self-pay | Source: Home / Self Care

## 2023-09-05 ENCOUNTER — Encounter (HOSPITAL_COMMUNITY): Payer: Self-pay | Admitting: Physician Assistant

## 2023-09-05 ENCOUNTER — Encounter (HOSPITAL_COMMUNITY): Payer: Self-pay | Admitting: Certified Registered Nurse Anesthetist

## 2023-09-05 ENCOUNTER — Ambulatory Visit (HOSPITAL_COMMUNITY): Admission: RE | Admit: 2023-09-05 | Payer: MEDICAID | Source: Home / Self Care | Admitting: General Surgery

## 2023-09-05 SURGERY — ABLATION, CONDYLOMA, USING LASER
Anesthesia: General

## 2023-09-05 MED ORDER — LIDOCAINE HCL (PF) 2 % IJ SOLN
INTRAMUSCULAR | Status: AC
  Filled 2023-09-05: qty 5

## 2023-09-05 MED ORDER — DEXMEDETOMIDINE HCL IN NACL 80 MCG/20ML IV SOLN
INTRAVENOUS | Status: AC
Start: 2023-09-05 — End: 2023-09-05
  Filled 2023-09-05: qty 20

## 2023-09-05 MED ORDER — MIDAZOLAM HCL 2 MG/2ML IJ SOLN
INTRAMUSCULAR | Status: AC
  Filled 2023-09-05: qty 2

## 2023-09-05 MED ORDER — PROPOFOL 10 MG/ML IV BOLUS
INTRAVENOUS | Status: AC
  Filled 2023-09-05: qty 20

## 2023-09-05 MED ORDER — FENTANYL CITRATE (PF) 100 MCG/2ML IJ SOLN
INTRAMUSCULAR | Status: AC
Start: 2023-09-05 — End: 2023-09-05
  Filled 2023-09-05: qty 2

## 2023-09-05 MED ORDER — ONDANSETRON HCL 4 MG/2ML IJ SOLN
INTRAMUSCULAR | Status: AC
  Filled 2023-09-05: qty 2

## 2023-09-05 NOTE — H&P (Signed)
 Dylan [name]

## 2023-09-09 ENCOUNTER — Emergency Department (HOSPITAL_COMMUNITY)
Admission: EM | Admit: 2023-09-09 | Discharge: 2023-09-10 | Disposition: A | Discharge status: Home / Self Care | Payer: MEDICAID | Attending: Emergency Medicine | Admitting: Emergency Medicine

## 2023-09-09 ENCOUNTER — Other Ambulatory Visit: Payer: Self-pay

## 2023-09-09 DIAGNOSIS — F1010 Alcohol abuse, uncomplicated: Secondary | ICD-10-CM | POA: Insufficient documentation

## 2023-09-09 DIAGNOSIS — J029 Acute pharyngitis, unspecified: Secondary | ICD-10-CM | POA: Insufficient documentation

## 2023-09-09 DIAGNOSIS — Z79899 Other long term (current) drug therapy: Secondary | ICD-10-CM | POA: Diagnosis not present

## 2023-09-09 DIAGNOSIS — R748 Abnormal levels of other serum enzymes: Secondary | ICD-10-CM | POA: Diagnosis present

## 2023-09-09 DIAGNOSIS — R82998 Other abnormal findings in urine: Secondary | ICD-10-CM | POA: Insufficient documentation

## 2023-09-09 DIAGNOSIS — M6282 Rhabdomyolysis: Secondary | ICD-10-CM | POA: Diagnosis not present

## 2023-09-09 DIAGNOSIS — Z91040 Latex allergy status: Secondary | ICD-10-CM | POA: Diagnosis not present

## 2023-09-09 LAB — RESP PANEL BY RT-PCR (RSV, FLU A&B, COVID)  RVPGX2
Influenza A by PCR: NEGATIVE
Influenza B by PCR: NEGATIVE
Resp Syncytial Virus by PCR: NEGATIVE
SARS Coronavirus 2 by RT PCR: NEGATIVE

## 2023-09-09 LAB — COMPREHENSIVE METABOLIC PANEL WITH GFR
ALT: 58 U/L — ABNORMAL HIGH (ref 0–44)
AST: 62 U/L — ABNORMAL HIGH (ref 15–41)
Albumin: 3.5 g/dL (ref 3.5–5.0)
Alkaline Phosphatase: 72 U/L (ref 38–126)
Anion gap: 9 (ref 5–15)
BUN: 18 mg/dL (ref 6–20)
CO2: 24 mmol/L (ref 22–32)
Calcium: 9.1 mg/dL (ref 8.9–10.3)
Chloride: 105 mmol/L (ref 98–111)
Creatinine, Ser: 1 mg/dL (ref 0.61–1.24)
GFR, Estimated: >60 mL/min (ref >60)
Glucose, Bld: 113 mg/dL — ABNORMAL HIGH (ref 70–99)
Potassium: 3.7 mmol/L (ref 3.5–5.1)
Sodium: 138 mmol/L (ref 135–145)
Total Bilirubin: 0.5 mg/dL (ref 0.0–1.2)
Total Protein: 6.9 g/dL (ref 6.5–8.1)

## 2023-09-09 LAB — CBC WITH DIFFERENTIAL/PLATELET
Abs Immature Granulocytes: 0.01 K/uL (ref 0.00–0.07)
Basophils Absolute: 0 K/uL (ref 0.0–0.1)
Basophils Relative: 0 %
Eosinophils Absolute: 0.3 K/uL (ref 0.0–0.5)
Eosinophils Relative: 5 %
HCT: 39 % (ref 39.0–52.0)
Hemoglobin: 12.9 g/dL — ABNORMAL LOW (ref 13.0–17.0)
Immature Granulocytes: 0 %
Lymphocytes Relative: 35 %
Lymphs Abs: 2.3 K/uL (ref 0.7–4.0)
MCH: 30.6 pg (ref 26.0–34.0)
MCHC: 33.1 g/dL (ref 30.0–36.0)
MCV: 92.6 fL (ref 80.0–100.0)
Monocytes Absolute: 0.5 K/uL (ref 0.1–1.0)
Monocytes Relative: 8 %
Neutro Abs: 3.4 K/uL (ref 1.7–7.7)
Neutrophils Relative %: 52 %
Platelets: 180 K/uL (ref 150–400)
RBC: 4.21 MIL/uL — ABNORMAL LOW (ref 4.22–5.81)
RDW: 13.6 % (ref 11.5–15.5)
WBC: 6.6 K/uL (ref 4.0–10.5)
nRBC: 0 % (ref 0.0–0.2)

## 2023-09-09 LAB — GROUP A STREP BY PCR: Group A Strep by PCR: NOT DETECTED

## 2023-09-09 LAB — CK
Total CK: 2337 U/L — ABNORMAL HIGH (ref 49–397)
Total CK: 1887 U/L — ABNORMAL HIGH (ref 49–397)

## 2023-09-09 MED ORDER — SODIUM CHLORIDE 0.9 % IV BOLUS
1000.0000 mL | Freq: Once | INTRAVENOUS | Status: AC
  Administered 2023-09-10: 1000 mL via INTRAVENOUS

## 2023-09-09 MED ORDER — SODIUM CHLORIDE 0.9 % IV SOLN: INTRAVENOUS | Status: DC

## 2023-09-09 MED ORDER — SODIUM CHLORIDE 0.9 % IV BOLUS
2000.0000 mL | Freq: Once | INTRAVENOUS | Status: AC
  Administered 2023-09-09: 2000 mL via INTRAVENOUS

## 2023-09-09 NOTE — ED Provider Notes (Signed)
 [place] EMERGENCY DEPARTMENT AT [facility] Provider Note   CSN: [id number] Arrival date & time: 09/09/23  8079     Patient presents with: Fatigue /Dark Urine  (Hx. Rhabdomyolosis)   Dylan [name] is a 58 y.o. male.   58 year old who presents with concern for possible rhabdo.  States that for the last several days he has had dark urine.  Notes he does work outside.  Was hospitalized for similar symptoms recently.  Denies any emesis.  No fever or diarrhea.  No flank pain.  Denies any dysuria.  No weakness noted.  Also has a secondary complaint of a sore throat without any trouble swallowing states he does feel thirsty       Prior to Admission medications   Medication Sig Start Date End Date Taking? Authorizing Provider  acetaminophen  (TYLENOL ) 500 MG tablet Take 1,000 mg by mouth every 6 (six) hours as needed for mild pain (pain score 1-3) or moderate pain (pain score 4-6).    [provider]  bictegravir-emtricitabine -tenofovir  AF (BIKTARVY ) 50-200-25 MG TABS tablet Take 1 tablet by mouth daily. 07/23/23   Luiz Channel, MD  diclofenac  Sodium (VOLTAREN ) 1 % GEL Apply 4 g topically 4 (four) times daily. Patient taking differently: Apply 4 g topically 4 (four) times daily as needed (pain). 06/19/23   Newlin, Enobong, MD  gabapentin  (NEURONTIN ) 300 MG capsule Take 2 capsules (600 mg total) by mouth 2 (two) times daily. 07/07/23   Newlin, Enobong, MD  hydrOXYzine  (VISTARIL ) 25 MG capsule Take 1 capsule by mouth every 8 (eight) hours as needed for anxiety, itching, nausea or vomiting. 07/17/23   [provider]  Multiple Vitamin (MULTIVITAMIN WITH MINERALS) TABS tablet Take 1 tablet by mouth daily.    [provider]  QUEtiapine  Fumarate 150 MG TABS Take 150 mg by mouth at bedtime. 07/17/23   [provider]  traMADol  (ULTRAM ) 50 MG tablet Take 1-2 tablets (50-100 mg total) by mouth every 6 (six) hours as needed. Patient taking differently: Take  50-100 mg by mouth every 6 (six) hours as needed for moderate pain (pain score 4-6) or severe pain (pain score 7-10). 07/27/23   Debby Hila, MD  traZODone  (DESYREL ) 150 MG tablet Take 150 mg by mouth at bedtime.    [provider]    Allergies: Penicillins, Latex, and Tape    Review of Systems  All other systems reviewed and are negative.   Updated Vital Signs BP 137/82 (BP Location: Right Arm)   Pulse 89   Temp 98.6 F (37 C)   Resp 18   SpO2 98%   Physical Exam Vitals and nursing note reviewed.  Constitutional:      General: He is not in acute distress.    Appearance: Normal appearance. He is well-developed. He is not toxic-appearing.  HENT:     Head: Normocephalic and atraumatic.  Eyes:     General: Lids are normal.     Conjunctiva/sclera: Conjunctivae normal.     Pupils: Pupils are equal, round, and reactive to light.  Neck:     Thyroid : No thyroid  mass.     Trachea: No tracheal deviation.  Cardiovascular:     Rate and Rhythm: Normal rate and regular rhythm.     Heart sounds: Normal heart sounds. No murmur heard.    No gallop.  Pulmonary:     Effort: Pulmonary effort is normal. No respiratory distress.     Breath sounds: Normal breath sounds. No stridor. No decreased breath sounds,  wheezing, rhonchi or rales.  Abdominal:     General: There is no distension.     Palpations: Abdomen is soft.     Tenderness: There is no abdominal tenderness. There is no rebound.  Musculoskeletal:        General: No tenderness. Normal range of motion.     Cervical back: Normal range of motion and neck supple.  Skin:    General: Skin is warm and dry.     Findings: No abrasion or rash.  Neurological:     Mental Status: He is alert and oriented to person, place, and time. Mental status is at baseline.     GCS: GCS eye subscore is 4. GCS verbal subscore is 5. GCS motor subscore is 6.     Cranial Nerves: No cranial nerve deficit.     Sensory: No sensory deficit.     Motor:  Motor function is intact.  Psychiatric:        Attention and Perception: Attention normal.        Speech: Speech normal.        Behavior: Behavior normal.     (all labs ordered are listed, but only abnormal results are displayed) Labs Reviewed  CBC WITH DIFFERENTIAL/PLATELET - Abnormal; Notable for the following components:      Result Value   RBC 4.21 (*)    Hemoglobin 12.9 (*)    All other components within normal limits  RESP PANEL BY RT-PCR (RSV, FLU A&B, COVID)  RVPGX2  COMPREHENSIVE METABOLIC PANEL WITH GFR  CK  URINALYSIS, ROUTINE W REFLEX MICROSCOPIC    EKG: None  Radiology: No results found.   Procedures   Medications Ordered in the ED  0.9 %  sodium chloride  infusion (has no administration in time range)  sodium chloride  0.9 % bolus 2,000 mL (2,000 mLs Intravenous New Bag/Given 09/09/23 2029)                                    Medical Decision Making Amount and/or Complexity of Data Reviewed Labs: ordered. ECG/medicine tests: ordered.  Risk Prescription drug management.   Patient had been complaints of respiratory symptoms.  COVID and flu test negative.  Strep test negative.  For CK chemic elevated at 2000.  Given 2 L of IV fluids.  Repeated his been downtrending.  He has no evidence of renal dysfunction.     Final diagnoses:  None    ED Discharge Orders     None          Dasie Faden, MD 09/09/23 2311

## 2023-09-09 NOTE — ED Triage Notes (Addendum)
 Pt arrives POV with complaints of coffee urine and leg cramping since Friday. Hx of recurrent Rhabdo and thinks that may be what he is experiencing again. Pt has not been taking any of his prescribed medication at home

## 2023-09-09 NOTE — Discharge Instructions (Signed)
 Drink plenty of liquids.  Follow-up with your doctor next week                                      Outpatient Substance Abuse  Treatment- uninsured  Narcotics Anonymous 24-HOUR HELPLINE Pre-recorded for Meeting Schedules [facility] [phone]  [email or web address] ALCOHOLICS ANONYMOUS  [place]   Answering Service [phone] Please Note: All [place] Meetings are Non-smoking [email or web address].es  Alcohol and Drug Services -  Insurance: Medicaid /State funding/private insurance Methadone, suboxone/Intensive outpatient  [place]   [phone] Fax: [phone] [address], [place], [place], [address] [place] [phone] Fax: [phone]    [address], [place], [place], [address] ([address] [place], [place], [place], [place], [place], [place], [place], [place]) [facility] [email or web address] Accepts State funding/Medicaid Transitional housing, Intensive Outpatient Treatment, Outpatient treatment, Veterans Services  Phone: [phone] Fax: [phone] Address: [address] [place] [address]   [employer] of Care (http://[email or web address].[email or web address]/) Insurance: Medicaid Case Management, Administrator, arts, Medication Management, Outpatient Therapy, Psychosocial Rehabilitation, Substance Abuse Intensive Outpatient  Phone: [phone] Fax: [phone] [address], [place], [place], [address]  [facility] Medicaid, most private insurance providers Types of Program: Individual/Group Therapy, Substance Abuse Treatment  Phone: [place] [phone] Fax: [phone] [address], [place], [place], [address] [place] [phone] [address], [address] [place], [place], [address]  [facility]  Medicaid Types of Program: SAIOP  Phone: [phone] Fax: [phone] [address] [place], [place], [place], [address] [facility] Medicaid/state funds Crisis line [phone] [place]  [facility] [phone] [place] [phone] [place] [phone]  [facility] [address];  [place], [place] [address] [phone]  Substance Abuse Intensive Outpatient Program [facility] [address] [place], [place] [address] [phone]- Substance abuse treatment  [facility]  Insurance: [facility], [address], sliding scale Types of Program: substance abuse treatment, transportation assistance Phone: [phone] Fax: [phone] Address: [address], [place] [address] [facility] (TrendSwap.[email or web address]) Insurance: UHC, [place], [place], [place] of [place] Program: addiction counseling, detoxification,  Phone: [phone]  Fax: [phone] Address: [address] [place], [place] [address]  [facility] (statewide facilities/programs) [address] (Medicaid/state funds) [place], [place] [address]                      http://barrett.com/ [phone] [place] mcalpine- [phone] [place]- [phone] [facility] [place] (2 Locations) (Medicaid/state funds) --[address]  walk in 8:30-12 and 1-2:30 [place], [id number]   [facility]- [phone] --[address] [place], [place] [address]  [phone] walk in 8:30-12 and 2-3:30  [facility] state funds/medicaid [address] [place], [place] [address] [phone] [facility] (Suboxone clinic) Medicaid/state funds  [address]  [place], [place] [address] [phone]   [facility]  [address] [place], [place] [address]  [phone] (24 hours) [place]- [address] [place], [place] [address]  [phone] (24 hours) [place]- [address] [place] [phone] - [address] [place] [phone] [place] [address] Leita Bradley [place] [phone] [facility]- Medicaid and state funds  [place]- [address] [place], [place]  [address] [phone] (24 hours) [place]- [address] [place], [place] [address] [phone]  [facility] [place], [place] [address] [phone] (24 hours) [place] [address] [place], [place]  [address] [phone] [place]- [address] [facility] [address]  [phone]

## 2023-09-09 NOTE — ED Provider Notes (Deleted)
 Patient signed out to me by Dr. Dasie.  Patient awaiting CT angiography to further evaluate her hypoxia.  CT has been performed, no evidence of PE.  No pneumonia.  Upon returning from radiology, however, patient began itching.  No angioedema or wheezing.  Patient scratching her extremities but no clear hives.  Must be concerned about possible IV dye allergy.  Treated with Decadron , IV Benadryl , IV Solu-Medrol .  Will need hospital admission.   Haze Lonni PARAS, MD 09/09/23 2340

## 2023-09-10 ENCOUNTER — Encounter (HOSPITAL_COMMUNITY): Payer: Self-pay

## 2023-09-10 DIAGNOSIS — M6282 Rhabdomyolysis: Secondary | ICD-10-CM

## 2023-09-10 LAB — BASIC METABOLIC PANEL WITH GFR
Anion gap: 12 (ref 5–15)
BUN: 11 mg/dL (ref 6–20)
CO2: 21 mmol/L — ABNORMAL LOW (ref 22–32)
Calcium: 8.5 mg/dL — ABNORMAL LOW (ref 8.9–10.3)
Chloride: 108 mmol/L (ref 98–111)
Creatinine, Ser: 0.92 mg/dL (ref 0.61–1.24)
GFR, Estimated: >60 mL/min (ref >60)
Glucose, Bld: 191 mg/dL — ABNORMAL HIGH (ref 70–99)
Potassium: 3.6 mmol/L (ref 3.5–5.1)
Sodium: 141 mmol/L (ref 135–145)

## 2023-09-10 LAB — URINALYSIS, ROUTINE W REFLEX MICROSCOPIC
Bilirubin Urine: NEGATIVE
Glucose, UA: NEGATIVE mg/dL
Hgb urine dipstick: NEGATIVE
Ketones, ur: NEGATIVE mg/dL
Leukocytes,Ua: NEGATIVE
Nitrite: NEGATIVE
Protein, ur: NEGATIVE mg/dL
Specific Gravity, Urine: 1.017 (ref 1.005–1.030)
pH: 6 (ref 5.0–8.0)

## 2023-09-10 LAB — RAPID URINE DRUG SCREEN, HOSP PERFORMED
Amphetamines: NOT DETECTED
Barbiturates: NOT DETECTED
Benzodiazepines: NOT DETECTED
Cocaine: POSITIVE — AB
Opiates: NOT DETECTED
Tetrahydrocannabinol: NOT DETECTED

## 2023-09-10 LAB — CK: Total CK: 1504 U/L — ABNORMAL HIGH (ref 49–397)

## 2023-09-10 LAB — SEDIMENTATION RATE: Sed Rate: 10 mm/h (ref 0–16)

## 2023-09-10 LAB — C-REACTIVE PROTEIN: CRP: 1 mg/dL — ABNORMAL HIGH (ref <1.0)

## 2023-09-10 LAB — TSH: TSH: 0.707 u[IU]/mL (ref 0.350–4.500)

## 2023-09-10 MED ORDER — METHOCARBAMOL 500 MG PO TABS
500.0000 mg | ORAL_TABLET | Freq: Three times a day (TID) | ORAL | Status: DC | PRN
  Administered 2023-09-10: 500 mg via ORAL
  Filled 2023-09-10: qty 1

## 2023-09-10 MED ORDER — ENOXAPARIN SODIUM 40 MG/0.4ML IJ SOSY
40.0000 mg | PREFILLED_SYRINGE | Freq: Once | INTRAMUSCULAR | Status: AC
  Administered 2023-09-10: 40 mg via SUBCUTANEOUS
  Filled 2023-09-10: qty 0.4

## 2023-09-10 MED ORDER — BICTEGRAVIR-EMTRICITAB-TENOFOV 50-200-25 MG PO TABS: 1.0000 | ORAL_TABLET | Freq: Every day | ORAL | Status: DC

## 2023-09-10 MED ORDER — ACETAMINOPHEN 500 MG PO TABS
1000.0000 mg | ORAL_TABLET | Freq: Four times a day (QID) | ORAL | Status: DC | PRN
  Administered 2023-09-10: 1000 mg via ORAL
  Filled 2023-09-10: qty 2

## 2023-09-10 NOTE — Progress Notes (Signed)
 CSW obtained cane from [facility] office and delivered it to patient at bedside.  Niels Portugal, MSW, LCSW Transitions of Care  Clinical Social Worker II [phone]

## 2023-09-10 NOTE — Progress Notes (Signed)
 CSW added substance abuse resources to patient's AVS.  Niels Portugal, MSW, LCSW Transitions of Care  Clinical Social Worker II [phone]

## 2023-09-10 NOTE — ED Notes (Signed)
 CM contacted for cane request.  Per CM on duty, she will order the cane.

## 2023-09-10 NOTE — ED Notes (Signed)
 PT bedside

## 2023-09-10 NOTE — Care Management (Signed)
 PT assessment revealed a need for cane, ordered.

## 2023-09-10 NOTE — ED Notes (Signed)
 Patient received cane, stable for DC.

## 2023-09-10 NOTE — ED Provider Notes (Signed)
 Patient signed out to me by Dr. Dasie.  Patient being treated for nontraumatic rhabdomyolysis, assumed to be secondary to drug and alcohol abuse.  Patient reports that he has not used any alcohol or drugs for 3 days.  The pain, however, began 3 days ago.  Compartments of bilateral lower extremities are soft.  CPKs are trending downwards with fluids.  Patient complaining of persistent pain.  Given a third liter of fluid.  Patient still unable to ambulate.   Haze Lonni PARAS, MD 09/10/23 [phone]

## 2023-09-10 NOTE — ED Provider Notes (Signed)
 At time of transfer of care, patient awaiting PT evaluation.  Plan of care will be to follow-up on the recommendations but given downtrending CK, patient is felt to be medically clear for discharge home if PT feels he is able to do this.  Otherwise, we will follow-up on the recommendations.  10:12 AM Patient was given a cane as recommended by physical therapy and we will discharge for outpatient follow-up per overnight team.   Clinical Impression: 1. Elevated CK     Disposition: Discharge  Condition: Good  I have discussed the results, Dx and Tx plan with the pt(& family if present). He/she/they expressed understanding and agree(s) with the plan. Discharge instructions discussed at great length. Strict return precautions discussed and pt &/or family have verbalized understanding of the instructions. No further questions at time of discharge.    New Prescriptions   No medications on file    Follow Up: No follow-up provider specified.    Neomia Herbel, Lonni PARAS, MD 09/10/23 1013

## 2023-09-10 NOTE — Evaluation (Signed)
 Physical Therapy Evaluation Patient Details Name: Dylan [name] MRN: [id number] DOB: 1965/08/22 Today's Date: 09/10/2023  History of Present Illness  58 y.o. male presents to [facility] hospital on 09/09/2023 with difficulty ambulating due to pain. PMH includes HIV, bipolar disorder, HTN, recurrent rhabdomyolysis.  Clinical Impression  Pt presents to PT with deficits in activity tolerance due to pain in both feet. Pt reports edema in both feet, resulting in blisters on both heels from friction in shoes when ambulating at work. PT notes minimal edema present at this time. Pt is able to ambulate for household distances at this time, no significant balance deviations observed. Pt will benefit from a SPC at the time of discharge to allow for improved stability when mobilizing in the community.        If plan is discharge home, recommend the following:     Can travel by private vehicle        Equipment Recommendations Cane  Recommendations for Other Services       Functional Status Assessment Patient has had a recent decline in their functional status and demonstrates the ability to make significant improvements in function in a reasonable and predictable amount of time.     Precautions / Restrictions Precautions Precautions: None Recall of Precautions/Restrictions: Intact Restrictions Weight Bearing Restrictions Per Provider Order: No      Mobility  Bed Mobility Overal bed mobility: Independent                  Transfers Overall transfer level: Modified independent Equipment used: Rolling walker (2 wheels)                    Ambulation/Gait Ambulation/Gait assistance: Modified independent (Device/Increase time) Gait Distance (Feet): 100 Feet (~20' without UE support of DME) Assistive device: Rolling walker (2 wheels) Gait Pattern/deviations: Step-through pattern Gait velocity: reduced Gait velocity interpretation: 1.31 - 2.62 ft/sec, indicative of limited community  ambulator   General Gait Details: slowed step-through gait, antalgic  Stairs            Wheelchair Mobility     Tilt Bed    Modified Rankin (Stroke Patients Only)       Balance Overall balance assessment: Mild deficits observed, not formally tested                                           Pertinent Vitals/Pain Pain Assessment Pain Assessment: Faces Faces Pain Scale: Hurts even more Pain Location: feet Pain Descriptors / Indicators: Sore Pain Intervention(s): Monitored during session    Home Living Family/patient expects to be discharged to:: Private residence Living Arrangements: Non-relatives/Friends Available Help at Discharge: Other (Comment) (roommate PRN) Type of Home: House Home Access: Level entry       Home Layout: One level Home Equipment: None      Prior Function Prior Level of Function : Independent/Modified Independent;Working/employed;Driving             Mobility Comments: working in Development worker, [employer] Extremity Assessment Upper Extremity Assessment: Overall WFL for tasks assessed    Lower Extremity Assessment Lower Extremity Assessment: RLE deficits/detail;LLE deficits/detail RLE Deficits / Details: pt reports swelling of both feet, marginal edema noted if any. pt does have a blister on each heel, he reports these formed during work due to friction from shoes 2/2 swelling RLE Sensation:  history of peripheral neuropathy LLE Deficits / Details: pt reports swelling of both feet, marginal edema noted if any. pt does have a blister on each heel, he reports these formed during work due to friction from shoes 2/2 swelling LLE Sensation: history of peripheral neuropathy    Cervical / Trunk Assessment Cervical / Trunk Assessment: Normal  Communication   Communication Communication: No apparent difficulties    Cognition Arousal: Alert Behavior During Therapy: WFL for tasks  assessed/performed   PT - Cognitive impairments: No apparent impairments                         Following commands: Intact       Cueing Cueing Techniques: Verbal cues     General Comments General comments (skin integrity, edema, etc.): VSS on RA    Exercises     Assessment/Plan    PT Assessment Patient needs continued PT services  PT Problem List Decreased activity tolerance;Decreased balance;Decreased mobility;Decreased knowledge of use of DME;Pain       PT Treatment Interventions Gait training;Stair training;Functional mobility training;Balance training;Neuromuscular re-education;Patient/family education    PT Goals (Current goals can be found in the Care Plan section)  Acute Rehab PT Goals Patient Stated Goal: to return to independence, reduce pain in feet PT Goal Formulation: With patient Time For Goal Achievement: 09/24/23 Potential to Achieve Goals: Good    Frequency Min 2X/week     Co-evaluation               AM-PAC PT 6 Clicks Mobility  Outcome Measure Help needed turning from your back to your side while in a flat bed without using bedrails?: None Help needed moving from lying on your back to sitting on the side of a flat bed without using bedrails?: None Help needed moving to and from a bed to a chair (including a wheelchair)?: None Help needed standing up from a chair using your arms (e.g., wheelchair or bedside chair)?: None Help needed to walk in hospital room?: None Help needed climbing 3-5 steps with a railing? : None 6 Click Score: 24    End of Session   Activity Tolerance: Patient tolerated treatment well Patient left: in bed;with call bell/phone within reach Nurse Communication: Mobility status PT Visit Diagnosis: Other abnormalities of gait and mobility (R26.89);Muscle weakness (generalized) (M62.81);Pain Pain - part of body: Ankle and joints of foot    Time: 0730-0745 PT Time Calculation (min) (ACUTE ONLY): 15  min   Charges:   PT Evaluation $PT Eval Low Complexity: 1 Low   PT General Charges $$ ACUTE PT VISIT: 1 Visit         Bernardino JINNY Ruth, PT, DPT Acute Rehabilitation Office [phone]   Bernardino JINNY Ruth 09/10/2023, 7:53 AM

## 2023-09-10 NOTE — H&P (Addendum)
 CONSULT NOTE    Dylan [name] FMW:[id number] DOB: 1965-12-06 DOA: 09/09/2023  PCP: Dylan Clam, MD   Patient coming from: Home   Chief Complaint:  Chief Complaint  Patient presents with   Fatigue /Dark Urine     Hx. Rhabdomyolosis    HPI:  Dylan [name] is a 58 y.o. male with hx of HIV on Biktarvy , bipolar disorder, HTN, hx of recurrent rhabdomyolysis, who presents with myalgias, and referred for admission due to c/o inability to ambulate 2/2 pain. Reports that he has had intermittent episodes of myalgias ever since 2019 when this problem seem to abruptly start.  His current episode began on Thursday.  He reports that he has to walk very slow without causing the pain mainly in his lower extremities.  He reports all of his prior episodes have been contained in the lower extremities both in the thighs and calves.  Denies prior episodes that had involved upper extremity myalgias.  To his recollection he thought he may have started on statins before this initial case in 2019 but on chart review I believe it may have been in 6/' 20.  He works as a TEFL teacher but he denies excessive mileage or exertion that he thinks would have precipitated this episode.  He exercises in the gym about twice per week and bench presses about 200 pounds.  He denies doing any lower extremity exercise at the gym.  He is using cocaine which he snorts and last used about 3 days ago.  Denies any family history of myopathy or autoimmune disease.  Has family history of neuropathy.  No family history of sudden death.  No other recent illness   Review of Systems:  ROS complete and negative except as marked above   Allergies  Allergen Reactions   Penicillins Other (See Comments)    Reaction type/severity unknown, childhood allergy; pt cannot recall all symptoms   Latex Rash   Tape Rash    Prior to Admission medications   Medication Sig Start Date End Date Taking? Authorizing Provider  acetaminophen   (TYLENOL ) 500 MG tablet Take 1,000 mg by mouth every 6 (six) hours as needed for mild pain (pain score 1-3) or moderate pain (pain score 4-6).    [provider]  bictegravir-emtricitabine -tenofovir  AF (BIKTARVY ) 50-200-25 MG TABS tablet Take 1 tablet by mouth daily. 07/23/23   Luiz Channel, MD  diclofenac  Sodium (VOLTAREN ) 1 % GEL Apply 4 g topically 4 (four) times daily. Patient taking differently: Apply 4 g topically 4 (four) times daily as needed (pain). 06/19/23   Newlin, Enobong, MD  gabapentin  (NEURONTIN ) 300 MG capsule Take 2 capsules (600 mg total) by mouth 2 (two) times daily. 07/07/23   Newlin, Enobong, MD  hydrOXYzine  (VISTARIL ) 25 MG capsule Take 1 capsule by mouth every 8 (eight) hours as needed for anxiety, itching, nausea or vomiting. 07/17/23   [provider]  Multiple Vitamin (MULTIVITAMIN WITH MINERALS) TABS tablet Take 1 tablet by mouth daily.    [provider]  QUEtiapine  Fumarate 150 MG TABS Take 150 mg by mouth at bedtime. 07/17/23   [provider]  traMADol  (ULTRAM ) 50 MG tablet Take 1-2 tablets (50-100 mg total) by mouth every 6 (six) hours as needed. Patient taking differently: Take 50-100 mg by mouth every 6 (six) hours as needed for moderate pain (pain score 4-6) or severe pain (pain score 7-10). 07/27/23   Debby Hila, MD  traZODone  (DESYREL ) 150 MG tablet Take 150 mg by mouth at bedtime.  [provider]    Past Medical History:  Diagnosis Date   Acute kidney failure (HCC)    Anxiety    Arthritis    Bipolar 1 disorder (HCC)    Colon polyps    Depression    GERD (gastroesophageal reflux disease)    Hepatitis B    Human immunodeficiency virus (HIV) (HCC)    Hyperlipidemia    Insomnia due to other mental disorder 02/03/2020   Intentional drug overdose (HCC) 11/26/2021   Neuromuscular disorder (HCC)    neuropathy   Neuropathy    Pre-diabetes    Rhabdomyolysis 02/19/2017   Unilateral primary osteoarthritis, left  knee 12/14/2021    Past Surgical History:  Procedure Laterality Date   ANAL FISTULOTOMY  07/27/2023   Procedure: ANAL FISTULOTOMY;  Surgeon: Debby Hila, MD;  Location: [facility];  Service: General;;   COLONOSCOPY     FOOT ARTHRODESIS Right 09/17/2019   Procedure: FUSION RIGHT LISFRANC JOINT;  Surgeon: Harden Jerona GAILS, MD;  Location[facility] OR;  Service: Orthopedics;  Laterality: Right;   FOOT ARTHRODESIS Right 09/2019   HEMORROIDECTOMY     HIGH RESOLUTION ANOSCOPY N/A 07/27/2023   Procedure: ANOSCOPY, HIGH RESOLUTION;  Surgeon: Debby Hila, MD;  Location: [facility];  Service: General;  Laterality: N/A;   IR FLUORO GUIDE CV LINE RIGHT  02/22/2017   IR US  GUIDE VASC ACCESS RIGHT  02/22/2017   RECTAL BIOPSY N/A 07/27/2023   Procedure: BIOPSY AND ABLATION, RECTUM;  Surgeon: Debby Hila, MD;  Location: [facility];  Service: General;  Laterality: N/A;   TOTAL KNEE ARTHROPLASTY Left 12/14/2021   Procedure: LEFT TOTAL KNEE ARTHROPLASTY;  Surgeon: Harden Jerona GAILS, MD;  Location: [facility] OR;  Service: Orthopedics;  Laterality: Left;   TUMOR REMOVAL     From Chest     reports that he has been smoking e-cigarettes and cigarettes. He has a 12.5 pack-year smoking history. He has never used smokeless tobacco. He reports current alcohol use. He reports that he does not currently use drugs after having used the following drugs: Cocaine and Marijuana.  Family History  Problem Relation Age of Onset   CAD Mother    Diabetes Father    Stroke Father    Colon cancer Maternal Aunt 60   Diabetes Maternal Aunt    Heart disease Maternal Uncle    Heart attack Maternal Uncle    Esophageal cancer Neg Hx    Rectal cancer Neg Hx    Stomach cancer Neg Hx      Physical Exam: Vitals:   09/09/23 1924 09/09/23 2030 09/09/23 2200  BP: 137/82 126/82 106/72  Pulse: 89 88 84  Resp: 18 15 18   Temp: 98.6 F (37 C)    SpO2: 98% 98% 98%    Gen: Awake, alert, NAD    CV: Regular, normal S1, S2, no murmurs  Resp: Normal WOB, CTAB  Abd: Flat, normoactive, nontender MSK: Heavy muscle bulk. There is no tenderness of the upper extremity muscles.  The lower extremities have mild tenderness in the thighs and calves.  No tense compartments, no pain with passive extension. LE symmetric, no edema  Skin: There are no significant rashes over sun exposed areas Neuro: Alert and interactive  Psych: euthymic, appropriate    Data review:   Labs reviewed, notable for:   CK 2337 -> 1887  Cr 1 near baseline.  No hyperkalemia or acidosis.  AST is 62, ALT is 58, down from prior  Blood  counts are unremarkable    Micro:  Results for orders placed or performed during the hospital encounter of 09/09/23  Resp panel by RT-PCR (RSV, Flu A&B, Covid) Anterior Nasal Swab     Status: None   Collection Time: 09/09/23  8:45 PM   Specimen: Anterior Nasal Swab  Result Value Ref Range Status   SARS Coronavirus 2 by RT PCR NEGATIVE NEGATIVE Final   Influenza A by PCR NEGATIVE NEGATIVE Final   Influenza B by PCR NEGATIVE NEGATIVE Final    Comment: (NOTE) The Xpert Xpress SARS-CoV-2/FLU/RSV plus assay is intended as an aid in the diagnosis of influenza from Nasopharyngeal swab specimens and should not be used as a sole basis for treatment. Nasal washings and aspirates are unacceptable for Xpert Xpress SARS-CoV-2/FLU/RSV testing.  Fact Sheet for Patients: BloggerCourse.com  Fact Sheet for Healthcare Providers: SeriousBroker.it  This test is not yet approved or cleared by the [place]  FDA and has been authorized for detection and/or diagnosis of SARS-CoV-2 by FDA under an Emergency Use Authorization (EUA). This EUA will remain in effect (meaning this test can be used) for the duration of the COVID-19 declaration under Section 564(b)(1) of the Act, 21 U.S.C. section 360bbb-3(b)(1), unless the authorization is terminated  or revoked.     Resp Syncytial Virus by PCR NEGATIVE NEGATIVE Final    Comment: (NOTE) Fact Sheet for Patients: BloggerCourse.com  Fact Sheet for Healthcare Providers: SeriousBroker.it  This test is not yet approved or cleared by the [place]  FDA and has been authorized for detection and/or diagnosis of SARS-CoV-2 by FDA under an Emergency Use Authorization (EUA). This EUA will remain in effect (meaning this test can be used) for the duration of the COVID-19 declaration under Section 564(b)(1) of the Act, 21 U.S.C. section 360bbb-3(b)(1), unless the authorization is terminated or revoked.  Performed at [facility], [address], [name], [place] [address]   Group A Strep by PCR     Status: None   Collection Time: 09/09/23  8:45 PM   Specimen: Anterior Nasal Swab; Sterile Swab  Result Value Ref Range Status   Group A Strep by PCR NOT DETECTED NOT DETECTED Final    Comment: Performed at [facility], [address], [place], [place] [address]    Imaging reviewed:  No results found.  EKG: Personally reviewed  SR with likely LVH, small q waves inferiorly, no acute ischemic changes    ED Course:  Treated with 3 L of NS and on rate at 250 / hr    Hx of CK:    Assessment/Plan:  58 y.o. male with hx HIV on Biktarvy , bipolar disorder, HTN, HSIL anus, hx of recurrent rhabdomyolysis, who presents with myalgias, and referred for admission due to c/o inability to ambulate 2/2 pain. Likely nontraumatic rhabdomyolysis, possibly cocaine related.   Nontraumatic, nonexertional rhabdomyolysis, ?? Inherited or inflammatory myopathy Lower extremity predominant myalgia  Hx onset in 2019; denies prior issues with rhabdo / myalgias to his knowledge. No Fhx of Myopathy, or autoimmune issues. He started on Statin as far as I can tell in 6/'20 (which was after his initial rhabdo in 2019). See CK above, has been abnormal on  all checks. By history he says always affects his lower extremities, and denies significant issues in the upper extremities. He does weight lift ~ 2 / wk and bench presses 200 lb. He does not do lower extremity exercises at the gym. 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 He is using cocaine which he snorts and last used a few days ago. On this admission CK is downtrending 2337 to 1887 with IVF in the ED. In regards to underlying etiology I think that he has demonstrated chronic and recurrent rhabdomyolysis and warrants an evaluation for an inflammatory or inherited myopathy. I have reviewed his prior autoimmune panel but this did not include a myositis specific Ab panel. I will send an initial panel for myopathy related Ab, and he should have expedited f/u with rheumatology outpatient. However, At this time do not feel his severity of rhabdomyolysis or symptoms related to myalgia are enough to warrant hospitalization. And I would recommend for symptomatic management and PT evaluation in the morning, with ED hold until that time.  -- Continue Tylenol , methocarbamol , heat. I would avoid opiate medication as well as NSAIDs at present.  -- PT evaluation in AM to assess mobility.  -- Can stop his IVF since downtrending and < 5K with lower risk of renal injury in this setting and repeat a CK later this morning to ensure this is not rising again with ongoing muscle injury -- Check ESR / CRP  -- Check autoimmune myositis panel as send out; Have ordered for test #[id number] myomarker 3 plus profile and for #[id number] Anti HMGCoAR Ab which are [facility] send out; will need to be followed by primary care outpatient. I have messaged primary care re: my assessment and to help arrange for rheum f/u as outpatient  -- He needs an expedited referral to rheumatology. Pending result of his autoimmune panel may require a muscle biopsy or genetic testing as next steps  -- He should  absolutely continue to avoid any statin medication  -- Send Urine toxicology  -- TOC for substance use resources   Chronic medical problems:  HIV: On Biktarvy . His recent RNA VL is <1.3 log. Doubt rhabdo direct HIV related, or from HAART.  Bipolar disorder: Not on medication  HTN: Not on medication  HSIL anus: Outpatient f/u  Disposition:  At this time does not require admission, I have placed all orders per above. He should have PT evaluation and if OK from mobility standpoint can discharge from the emergency department. Please reach out if any concern or if significant worsening in CK off of fluids and we can admit for observation and additional fluids.   [facility] will sign off at this time, please call again if needed.   There is no height or weight on file to calculate BMI.    DVT prophylaxis: Lovenox  x 1, can extend if nonambulatory  Code Status:  Full Code; presumed full  Diet:  Diet Orders (From admission, onward)    None      Family Communication:  None   Consults:  None   Admission status:  Declined for admission, ED hold until PT eval in AM.   Severity of Illness: ED hold    Dorn Dawson, MD [facility]  How to contact the [facility] Attending or Consulting provider 7A - 7P or covering provider during after hours 7P -7A, for this patient.  Check the care team in [facility] and look for a) attending/consulting [facility] provider listed and b) the [facility] team listed Log into [email or web address] and use [place]'s universal password to access. If you do not have the password, please contact the hospital operator. Locate the [facility] provider you are looking for under [facility] and page to a number that you can be directly reached.  If you still have difficulty reaching the provider, please page the [facility] (Director on Call) for the Hospitalists listed on amion for assistance.  09/10/2023, 1:23 AM

## 2023-09-11 ENCOUNTER — Other Ambulatory Visit: Payer: Self-pay | Admitting: Family Medicine

## 2023-09-11 DIAGNOSIS — M6282 Rhabdomyolysis: Secondary | ICD-10-CM

## 2023-09-16 LAB — MISC LABCORP TEST (SEND OUT): Labcorp test code: 520385

## 2023-09-19 ENCOUNTER — Emergency Department (HOSPITAL_COMMUNITY)
Admission: EM | Admit: 2023-09-19 | Discharge: 2023-09-20 | Disposition: A | Discharge status: Other Acute Inpatient Hospital | Payer: MEDICAID | Attending: Emergency Medicine | Admitting: Emergency Medicine

## 2023-09-19 ENCOUNTER — Other Ambulatory Visit: Payer: Self-pay

## 2023-09-19 ENCOUNTER — Encounter (HOSPITAL_COMMUNITY): Payer: Self-pay | Admitting: Emergency Medicine

## 2023-09-19 DIAGNOSIS — F29 Unspecified psychosis not due to a substance or known physiological condition: Secondary | ICD-10-CM | POA: Diagnosis not present

## 2023-09-19 DIAGNOSIS — Z21 Asymptomatic human immunodeficiency virus [HIV] infection status: Secondary | ICD-10-CM | POA: Diagnosis not present

## 2023-09-19 DIAGNOSIS — Z91040 Latex allergy status: Secondary | ICD-10-CM | POA: Diagnosis not present

## 2023-09-19 DIAGNOSIS — F319 Bipolar disorder, unspecified: Secondary | ICD-10-CM | POA: Diagnosis not present

## 2023-09-19 DIAGNOSIS — Y903 Blood alcohol level of 60-79 mg/100 ml: Secondary | ICD-10-CM | POA: Insufficient documentation

## 2023-09-19 DIAGNOSIS — Z733 Stress, not elsewhere classified: Secondary | ICD-10-CM | POA: Diagnosis present

## 2023-09-19 DIAGNOSIS — Z79899 Other long term (current) drug therapy: Secondary | ICD-10-CM | POA: Diagnosis not present

## 2023-09-19 DIAGNOSIS — I10 Essential (primary) hypertension: Secondary | ICD-10-CM | POA: Insufficient documentation

## 2023-09-19 DIAGNOSIS — R45851 Suicidal ideations: Secondary | ICD-10-CM

## 2023-09-19 LAB — CBC
HCT: 43.3 % (ref 39.0–52.0)
Hemoglobin: 13.9 g/dL (ref 13.0–17.0)
MCH: 29.8 pg (ref 26.0–34.0)
MCHC: 32.1 g/dL (ref 30.0–36.0)
MCV: 92.7 fL (ref 80.0–100.0)
Platelets: 278 K/uL (ref 150–400)
RBC: 4.67 MIL/uL (ref 4.22–5.81)
RDW: 13.4 % (ref 11.5–15.5)
WBC: 8.9 K/uL (ref 4.0–10.5)
nRBC: 0 % (ref 0.0–0.2)

## 2023-09-19 LAB — URINE DRUG SCREEN
Amphetamines: NEGATIVE
Barbiturates: NEGATIVE
Benzodiazepines: NEGATIVE
Cocaine: POSITIVE — AB
Fentanyl: NEGATIVE
Methadone Scn, Ur: NEGATIVE
Opiates: NEGATIVE
Tetrahydrocannabinol: POSITIVE — AB

## 2023-09-19 LAB — COMPREHENSIVE METABOLIC PANEL WITH GFR
ALT: 58 U/L — ABNORMAL HIGH (ref 0–44)
AST: 40 U/L (ref 15–41)
Albumin: 4.5 g/dL (ref 3.5–5.0)
Alkaline Phosphatase: 95 U/L (ref 38–126)
Anion gap: 13 (ref 5–15)
BUN: 16 mg/dL (ref 6–20)
CO2: 23 mmol/L (ref 22–32)
Calcium: 9.8 mg/dL (ref 8.9–10.3)
Chloride: 101 mmol/L (ref 98–111)
Creatinine, Ser: 0.96 mg/dL (ref 0.61–1.24)
GFR, Estimated: >60 mL/min (ref >60)
Glucose, Bld: 94 mg/dL (ref 70–99)
Potassium: 4.4 mmol/L (ref 3.5–5.1)
Sodium: 137 mmol/L (ref 135–145)
Total Bilirubin: 0.4 mg/dL (ref 0.0–1.2)
Total Protein: 7.9 g/dL (ref 6.5–8.1)

## 2023-09-19 LAB — ETHANOL: Alcohol, Ethyl (B): 66 mg/dL — ABNORMAL HIGH

## 2023-09-19 MED ORDER — QUETIAPINE FUMARATE ER 300 MG PO TB24
300.0000 mg | ORAL_TABLET | Freq: Every day | ORAL | Status: DC
  Administered 2023-09-19: 300 mg via ORAL
  Filled 2023-09-19: qty 1

## 2023-09-19 MED ORDER — BICTEGRAVIR-EMTRICITAB-TENOFOV 50-200-25 MG PO TABS
1.0000 | ORAL_TABLET | Freq: Every day | ORAL | Status: DC
Start: 2023-09-19 — End: 2023-09-20
  Administered 2023-09-19 – 2023-09-20 (×2): 1 via ORAL
  Filled 2023-09-19 (×2): qty 1

## 2023-09-19 MED ORDER — NICOTINE 21 MG/24HR TD PT24
21.0000 mg | MEDICATED_PATCH | Freq: Once | TRANSDERMAL | Status: DC
Start: 2023-09-19 — End: 2023-09-20
  Administered 2023-09-19: 21 mg via TRANSDERMAL
  Filled 2023-09-19: qty 1

## 2023-09-19 MED ORDER — GABAPENTIN 300 MG PO CAPS
600.0000 mg | ORAL_CAPSULE | Freq: Two times a day (BID) | ORAL | Status: DC
  Administered 2023-09-19 – 2023-09-20 (×2): 600 mg via ORAL
  Filled 2023-09-19 (×2): qty 2

## 2023-09-19 NOTE — ED Notes (Signed)
 Pt given some crackers and something to drink. Pt in bed resting, NAD noted. RR even and unlabored.

## 2023-09-19 NOTE — ED Notes (Signed)
 Pt standing in hallway shouting give me my wallet. Pt was repeatedly informed by Gladis, Nurse Tech that his belongings were stored. Pt continued to repeatedly state give me my wallet.   Security called.

## 2023-09-19 NOTE — ED Notes (Addendum)
 disregard

## 2023-09-19 NOTE — ED Notes (Signed)
 [facility] and [facility] called at this time for report on patient

## 2023-09-19 NOTE — Progress Notes (Addendum)
 Pt has been accepted to [facility] TODAY 09/19/2023 Bed assignment: Main campus  Pt meets inpatient criteria per: [facility] Patient Np   Attending Physician will be Millie Manners, MD  Report can be called to: [phone] (this is a pager, please leave call-back number when giving report)  Pt can arrive after ASAP   Care Team Notified: Vermell Burkes RN, Efrain Patient NP, [facility]   [place] Jowel Waltner LCSW-A   09/19/2023 11:42 AM

## 2023-09-19 NOTE — BH Assessment (Signed)
 Patient was deferred to IRIS for a telepsych assessment. The assigned care coordinator will provide updates regarding the scheduling of the assessment. IRIS coordinator can be reached at [phone] for further information on the timing of the telepsych evaluation.

## 2023-09-19 NOTE — ED Provider Notes (Signed)
 [place] EMERGENCY DEPARTMENT AT [facility] Provider Note   CSN: [id number] Arrival date & time: 09/19/23  0300     Patient presents with: Suicidal   Dylan [name] is a 58 y.o. male.   The history is provided by the patient and medical records.   58 y.o. M with history of bipolar disorder, substance abuse, hypertension, fatty liver, HIV, PTSD, presenting to the ED for suicidal ideation.  Patient reports he asked his sister to bring him to the hospital so that he could just end it all.  He states life has been causing him a lot of stress recently and he would just rather not deal with it.  He states he was peeling off his skin with a knife, however does not have any wounds.  States he has thought about hurting himself with a scalpel.  He does have access to knives.  Admits to drinking some wine tonight but denies drug use.  Supposed to be taking Seroquel  and hydroxyzine , has not picked these up from the pharmacy in some time.  Prior to Admission medications   Medication Sig Start Date End Date Taking? Authorizing Provider  acetaminophen  (TYLENOL ) 500 MG tablet Take 1,000 mg by mouth every 6 (six) hours as needed for mild pain (pain score 1-3) or moderate pain (pain score 4-6).    [provider]  bictegravir-emtricitabine -tenofovir  AF (BIKTARVY ) 50-200-25 MG TABS tablet Take 1 tablet by mouth daily. 07/23/23   Luiz Channel, MD  diclofenac  Sodium (VOLTAREN ) 1 % GEL Apply 4 g topically 4 (four) times daily. Patient taking differently: Apply 4 g topically 4 (four) times daily as needed (pain). 06/19/23   Newlin, Enobong, MD  gabapentin  (NEURONTIN ) 300 MG capsule Take 2 capsules (600 mg total) by mouth 2 (two) times daily. 07/07/23   Newlin, Enobong, MD  hydrOXYzine  (VISTARIL ) 25 MG capsule Take 1 capsule by mouth every 8 (eight) hours as needed for anxiety, itching, nausea or vomiting. 07/17/23   [provider]  Multiple Vitamin (MULTIVITAMIN WITH MINERALS)  TABS tablet Take 1 tablet by mouth daily.    [provider]  QUEtiapine  Fumarate 150 MG TABS Take 150 mg by mouth at bedtime. 07/17/23   [provider]  traMADol  (ULTRAM ) 50 MG tablet Take 1-2 tablets (50-100 mg total) by mouth every 6 (six) hours as needed. Patient taking differently: Take 50-100 mg by mouth every 6 (six) hours as needed for moderate pain (pain score 4-6) or severe pain (pain score 7-10). 07/27/23   Debby Hila, MD  traZODone  (DESYREL ) 150 MG tablet Take 150 mg by mouth at bedtime.    [provider]    Allergies: Penicillins, Latex, and Tape    Review of Systems  Psychiatric/Behavioral:  Positive for suicidal ideas.   All other systems reviewed and are negative.   Updated Vital Signs BP 137/69 (BP Location: Left Arm)   Pulse 90   Temp 98 F (36.7 C) (Oral)   Resp 16   SpO2 99%   Physical Exam Vitals and nursing note reviewed.  Constitutional:      Appearance: He is well-developed.  HENT:     Head: Normocephalic and atraumatic.  Eyes:     Conjunctiva/sclera: Conjunctivae normal.     Pupils: Pupils are equal, round, and reactive to light.  Cardiovascular:     Rate and Rhythm: Normal rate and regular rhythm.     Heart sounds: Normal heart sounds.  Pulmonary:     Effort: Pulmonary effort is normal.  Breath sounds: Normal breath sounds.  Abdominal:     General: Bowel sounds are normal.     Palpations: Abdomen is soft.     Tenderness: There is no abdominal tenderness. There is no rebound.  Musculoskeletal:        General: Normal range of motion.     Cervical back: Normal range of motion.  Skin:    General: Skin is warm and dry.  Neurological:     Mental Status: He is alert and oriented to person, place, and time.  Psychiatric:     Comments: SI with plan to cut self with scalpel Denies HI/AVH     (all labs ordered are listed, but only abnormal results are displayed) Labs Reviewed  COMPREHENSIVE METABOLIC PANEL WITH  GFR - Abnormal; Notable for the following components:      Result Value   ALT 58 (*)    All other components within normal limits  ETHANOL - Abnormal; Notable for the following components:   Alcohol, Ethyl (B) 66 (*)    All other components within normal limits  CBC  URINE DRUG SCREEN    EKG: None  Radiology: No results found.   Procedures   Medications Ordered in the ED - No data to display                                  Medical Decision Making Amount and/or Complexity of Data Reviewed Labs: ordered. ECG/medicine tests: ordered and independent interpretation performed.   58 year old male presenting to the ED with suicidal ideation.  Plan to hurt himself with a scalpel.  Does report access to knives.  Denies any HI/AVH.  Admits to drinking some wine prior to coming to the hospital tonight.  Awake, alert, oriented here.  He reports he was peeling off his skin but there are no wounds noted to his arms or legs.  He is hemodynamically stable.  Labs here are overall reassuring, mildly elevated ALT but no electrolyte derangements.  Normal renal function.  Ethanol 66.  UDS is pending.  Medically cleared.  Will get TTS evaluation.  TTS evaluation deferred to IRIS to be done later this AM.  Care will be signed out to oncoming provider to follow-up on recommendations.  Final diagnoses:  Suicidal ideation    ED Discharge Orders     None          Jarold Olam HERO, PA-C 09/19/23 0606    Theadore Ozell HERO, MD 09/19/23 [phone]

## 2023-09-19 NOTE — ED Notes (Signed)
 Pt walked down hallway and into ED lobby. Pt was returned to bed by police and security.

## 2023-09-19 NOTE — ED Triage Notes (Addendum)
 Patient stated he wants to die and plans on using a scalpel. Patient report he was hearing voices stating They want me to go to that tunnel to be with heavenly being. Patient stated my sister drop me here because I want to kill myself. Patient denies HI.

## 2023-09-19 NOTE — ED Notes (Signed)
 3 bags of belongs at nurses station.

## 2023-09-19 NOTE — BH Assessment (Signed)
 At 0554, IRIS deferred consult to day shift provider.    Jackson JONETTA Broach, MS, [facility], [facility] Triage Specialist [phone]

## 2023-09-19 NOTE — ED Notes (Addendum)
 Made several attempts to call Sheriffs department to have patient transferred at [phone]. Voicemail left with call back number.

## 2023-09-19 NOTE — ED Notes (Signed)
 Pt brought over to SAPU escorted by security. Pt walked with a steady gait, NAD noted. Preparing food and something to drink per pt request. No other complaints at this time.

## 2023-09-19 NOTE — ED Notes (Signed)
 Lauren, RN at [employer] updated about pt transport at this time

## 2023-09-19 NOTE — ED Notes (Signed)
 Pt unable to eat spaghetti due to GERD, given two turkey sandwiches.

## 2023-09-19 NOTE — Consult Note (Signed)
 [facility] Psychiatric Consult Initial  Patient Name: .Dylan [name]  MRN: [id number]  DOB: Sep 02, 1965  Consult Order details:  Orders (From admission, onward)     Start     Ordered   09/19/23 0411  CONSULT TO CALL ACT TEAM       Ordering Provider: Jarold Olam HERO, PA-C  Provider:  (Not yet assigned)  Question:  Reason for Consult?  Answer:  Psych consult   09/19/23 0410             Mode of Visit: In person    Psychiatry Consult Evaluation  Service Date: September 19, 2023 LOS:  LOS: 0 days  Chief Complaint My nerves are hurting  Primary Psychiatric Diagnoses  Suicidal Ideation 2.  Bipolar Disorder   Assessment  Maui Britten is a 58 y.o. male admitted: Presented to the EDfor 09/19/2023  3:06 AM for brought to emergency department by sister with complaints of suicidal ideation and auditory hallucinations. He carries the psychiatric diagnoses of bipolar disorder, MDD, polysubstance abuse and GAD and has a past medical history of HIV, AKI, degenerative disc disease, HTN, MI, hepatitis B and peripheral neuropathy.   His current presentation of hallucinating and expressing SI is most consistent with decompensated bipolar disorder and substance abuse. He meets criteria for inpatient psychiatric hospitalization based on being a danger to himself and acute psychosis.  Current outpatient psychotropic medications include seroquel  and historically he has had a positive response to these medications. He was not compliant with medications prior to admission as evidenced by patient report that he has not filled them since he moved to [place]. On initial examination, patient is cooperative but visibly uncomfortable and requesting help. Please see plan below for detailed recommendations.   Diagnoses:  Active Hospital problems: Principal Problem:   Suicidal ideation Active Problems:   Bipolar disorder (HCC)    Plan   ## Psychiatric Medication Recommendations:  Start --Seroquel   XR 300mg  PO Q HS  ## Medical Decision Making Capacity: Not specifically addressed in this encounter  ## Further Work-up:  -- most recent EKG on 09/19/23 had QtC of 497 -- Pertinent labwork reviewed earlier this admission includes: CBC, CMP, alcohol and UDS   ## Disposition:-- We recommend inpatient psychiatric hospitalization after medical hospitalization. Patient has been involuntarily committed on 09/19/23.   ## Behavioral / Environmental: - No specific recommendations at this time.     ## Safety and Observation Level:  - Based on my clinical evaluation, I estimate the patient to be at high risk of self harm in the current setting. - At this time, we recommend  1:1 Observation. This decision is based on my review of the chart including patient's history and current presentation, interview of the patient, mental status examination, and consideration of suicide risk including evaluating suicidal ideation, plan, intent, suicidal or self-harm behaviors, risk factors, and protective factors. This judgment is based on our ability to directly address suicide risk, implement suicide prevention strategies, and develop a safety plan while the patient is in the clinical setting. Please contact our team if there is a concern that risk level has changed.  CSSR Risk Category:C-SSRS RISK CATEGORY: High Risk  Suicide Risk Assessment: Patient has following modifiable risk factors for suicide: active suicidal ideation, medication noncompliance, active mental illness (to encompass adhd, tbi, mania, psychosis, trauma reaction), and current symptoms: anxiety/panic, insomnia, impulsivity, anhedonia, hopelessness, which we are addressing by recommending inpatient psychiatric hospitalization. Patient has following non-modifiable or demographic risk factors for suicide: male gender and  psychiatric hospitalization Patient has the following protective factors against suicide: Access to outpatient mental health care and  Supportive family  Thank you for this consult request. Recommendations have been communicated to the primary team.  We will continue to follow at this time.   Efrain DELENA Patient, NP       History of Present Illness  Relevant Aspects of Hospital ED Course:  Admitted on 09/19/2023 for brought to emergency department by sister with complaints of suicidal ideation and auditory hallucinations. He carries the psychiatric diagnoses of bipolar disorder, MDD, polysubstance abuse and GAD and has a past medical history of HIV, AKI, degenerative disc disease, HTN, MI, hepatitis B and peripheral neuropathy.  Patient Report:  Sebastain Fishbaugh, is seen face to face by this provider, consulted with Dr. Larina; and chart reviewed on 09/19/23.  On evaluation Breydon Senters reports my nerves are hurting.  He says he has not been taking his medication because he ran out and did have his prescription transferred from the pharmacy in his previous town.  Malingering remains on the differential for this patient.   During evaluation Abdulwahab Demelo is laying in bed in mild distress.  He is alert & oriented x 4, anxious, cooperative and attentive for this assessment.  His mood is anxious with congruent affect.  He has normal speech, and behavior.  Objectively there is evidence of psychosis/mania and delusional thinking. Pt does not appear to be responding to internal or external stimuli.  Patient is able to converse coherently, goal directed thoughts, no distractibility, or pre-occupation.  He answers sometimes to all including: suicidal/self-harm/homicidal ideation, psychosis, and paranoia. He specifically says his voices are chatters and whispers. Patient answered questions appropriately.    I personally spent a total of 60 minutes in the care of the patient today including preparing to see the patient, getting/reviewing separately obtained history, performing a medically appropriate exam/evaluation, counseling and educating,  placing orders, referring and communicating with other health care professionals, documenting clinical information in the EHR, independently interpreting results, and coordinating care.  Psych ROS:  Depression: endorses Anxiety:  endorses Mania (lifetime and current): endorses Psychosis: (lifetime and current): endorses  Review of Systems  Neurological:  Positive for sensory change.  Psychiatric/Behavioral:  Positive for hallucinations, substance abuse and suicidal ideas.   All other systems reviewed and are negative.    Psychiatric and Social History  Psychiatric History:  Information collected from patient and chart review  Prev Dx/Sx: polysubstance abuse, bipolar disorder, PTSD and GAD Current Psych Provider: none  Home Meds (current): seroquel , trazodone  Previous Med Trials: many Therapy: denies   Prior Psych Hospitalization: yes, many  Prior Self Harm: multiple previous attempts - OD, held gun to head, plan to jump off parking deck Prior Violence: denies   Family Psych History: Mom - mental issues, doesn't know dx; Uncle - schizophrenia Family Hx suicide: denies   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 Hx: unknown Living Situation: Stays at [employer] and says he is waiting for his apartment to be reasy Spiritual Hx: none noted Access to weapons/lethal means: denies    Substance History Alcohol: yes Tobacco: denies all Illicit drugs: crack, THC Prescription drug abuse: denies  Rehab hx: yes  Exam Findings  Physical Exam:  Vital Signs:  Temp:  [98 F (36.7 C)] 98 F (36.7 C) (09/10 0306) Pulse Rate:  [90] 90 (  09/10 0306) Resp:  [16] 16 (09/10 0312) BP: (137)/(69) 137/69 (09/10 0306) SpO2:  [99 %] 99 % (09/10 0306) Blood pressure 137/69, pulse 90, temperature 98 F (36.7 C), temperature source Oral, resp.  rate 16, SpO2 99%. There is no height or weight on file to calculate BMI.  Physical Exam Vitals and nursing note reviewed.  Eyes:     Pupils: Pupils are equal, round, and reactive to light.  Pulmonary:     Effort: Pulmonary effort is normal.  Skin:    General: Skin is dry.  Neurological:     Mental Status: He is alert and oriented to person, place, and time.  Psychiatric:        Attention and Perception: He perceives auditory and visual hallucinations.        Mood and Affect: Mood is anxious.        Speech: Speech normal.        Behavior: Behavior is actively hallucinating. Behavior is cooperative.        Thought Content: Thought content includes homicidal and suicidal ideation.        Cognition and Memory: Cognition and memory normal.        Judgment: Judgment is impulsive.     Mental Status Exam: General Appearance: Casual  Orientation:  Full (Time, Place, and Person)  Memory:  Immediate;   Fair Recent;   Fair Remote;   Fair  Concentration:  Concentration: Fair  Recall:  Fair  Attention  Fair  Eye Contact:  Fair  Speech:  Clear and Coherent  Language:  Fair  Volume:  Normal  Mood: anxious  Affect:  Congruent  Thought Process:  Coherent  Thought Content:  Hallucinations: Auditory  Suicidal Thoughts:  Yes.  without intent/plan  Homicidal Thoughts:  Yes.  without intent/plan  Judgement:  Impaired  Insight:  Lacking  Psychomotor Activity:  Normal  Akathisia:  No  Fund of Knowledge:  Fair      Assets:  Desire for Improvement Leisure Time  Cognition:  WNL  ADL's:  Intact  AIMS (if indicated):        Other History   These have been pulled in through the EMR, reviewed, and updated if appropriate.  Family History:  The patient's family history includes CAD in his mother; Colon cancer (age of onset: 61) in his maternal aunt; Diabetes in his father and maternal aunt; Heart attack in his maternal uncle; Heart disease in his maternal uncle; Stroke in his  father.  Medical History: Past Medical History:  Diagnosis Date  . Acute kidney failure (HCC)   . Anxiety   . Arthritis   . Bipolar 1 disorder (HCC)   . Colon polyps   . Depression   . GERD (gastroesophageal reflux disease)   . Hepatitis B   . Human immunodeficiency virus (HIV) (HCC)   . Hyperlipidemia   . Insomnia due to other mental disorder 02/03/2020  . Intentional drug overdose (HCC) 11/26/2021  . Neuromuscular disorder (HCC)    neuropathy  . Neuropathy   . Pre-diabetes   . Rhabdomyolysis 02/19/2017  . Unilateral primary osteoarthritis, left knee 12/14/2021    Surgical History: Past Surgical History:  Procedure Laterality Date  . ANAL FISTULOTOMY  07/27/2023   Procedure: ANAL FISTULOTOMY;  Surgeon: Debby Hila, MD;  Location: [facility];  Service: General;;  . COLONOSCOPY    . FOOT ARTHRODESIS Right 09/17/2019   Procedure: FUSION RIGHT LISFRANC JOINT;  Surgeon: Harden Jerona GAILS, MD;  Location: [facility] OR;  Service: Orthopedics;  Laterality: Right;  . FOOT ARTHRODESIS Right 09/2019  . HEMORROIDECTOMY    . HIGH RESOLUTION ANOSCOPY N/A 07/27/2023   Procedure: ANOSCOPY, HIGH RESOLUTION;  Surgeon: Debby Hila, MD;  Location: [facility];  Service: General;  Laterality: N/A;  . IR FLUORO GUIDE CV LINE RIGHT  02/22/2017  . IR US  GUIDE VASC ACCESS RIGHT  02/22/2017  . RECTAL BIOPSY N/A 07/27/2023   Procedure: BIOPSY AND ABLATION, RECTUM;  Surgeon: Debby Hila, MD;  Location: [facility];  Service: General;  Laterality: N/A;  . TOTAL KNEE ARTHROPLASTY Left 12/14/2021   Procedure: LEFT TOTAL KNEE ARTHROPLASTY;  Surgeon: Harden Jerona GAILS, MD;  Location: [facility] OR;  Service: Orthopedics;  Laterality: Left;  . TUMOR REMOVAL     From Chest     Medications:  No current facility-administered medications for this encounter.  Current Outpatient Medications:  .  acetaminophen  (TYLENOL ) 500 MG tablet, Take 1,000 mg by mouth every 6 (six)  hours as needed for mild pain (pain score 1-3) or moderate pain (pain score 4-6)., Disp: , Rfl:  .  bictegravir-emtricitabine -tenofovir  AF (BIKTARVY ) 50-200-25 MG TABS tablet, Take 1 tablet by mouth daily., Disp: 30 tablet, Rfl: 11 .  diclofenac  Sodium (VOLTAREN ) 1 % GEL, Apply 4 g topically 4 (four) times daily. (Patient taking differently: Apply 4 g topically 4 (four) times daily as needed (pain).), Disp: 100 g, Rfl: 1 .  gabapentin  (NEURONTIN ) 300 MG capsule, Take 2 capsules (600 mg total) by mouth 2 (two) times daily., Disp: 360 capsule, Rfl: 1 .  hydrOXYzine  (VISTARIL ) 25 MG capsule, Take 1 capsule by mouth every 8 (eight) hours as needed for anxiety, itching, nausea or vomiting., Disp: , Rfl:  .  Multiple Vitamin (MULTIVITAMIN WITH MINERALS) TABS tablet, Take 1 tablet by mouth daily., Disp: , Rfl:  .  QUEtiapine  Fumarate 150 MG TABS, Take 150 mg by mouth at bedtime., Disp: , Rfl:  .  traMADol  (ULTRAM ) 50 MG tablet, Take 1-2 tablets (50-100 mg total) by mouth every 6 (six) hours as needed. (Patient taking differently: Take 50-100 mg by mouth every 6 (six) hours as needed for moderate pain (pain score 4-6) or severe pain (pain score 7-10).), Disp: 30 tablet, Rfl: 0 .  traZODone  (DESYREL ) 150 MG tablet, Take 150 mg by mouth at bedtime., Disp: , Rfl:   Allergies: Allergies  Allergen Reactions  . Penicillins Other (See Comments)    Reaction type/severity unknown, childhood allergy; pt cannot recall all symptoms  . Latex Rash  . Tape Rash    Efrain DELENA Patient, NP

## 2023-09-19 NOTE — ED Notes (Signed)
 2nd voicemail left to sheriffs transport number at this time. Number called is [phone].

## 2023-09-19 NOTE — ED Notes (Signed)
  Date of Service: 09/19/2023 11:40 AM    Pt has been accepted to [facility] TODAY 09/19/2023 Bed assignment: Main campus   Pt meets inpatient criteria per: Efrain Patient Np    Attending Physician will be Millie Manners, MD   Report can be called to: [phone] (this is a pager, please leave call-back number when giving report)   Pt can arrive after ASAP    Care Team Notified: Vermell Burkes RN, Efrain Patient NP, [facility]    [place] LCSW-A    09/19/2023 11:42 AM

## 2023-09-19 NOTE — Progress Notes (Signed)
 LCSW Progress Note  [id number]   Dylan [name]  09/19/2023  11:33 AM  Description:   Inpatient Psychiatric Referral  Patient was recommended inpatient per Efrain Patient (NP). There are no available beds at [facility], per [facility] Carlo RN). Patient was referred to the following out of network facilities:   [facility] Provider Address Phone Fax  [facility]  [address] [place] [address] [phone]  [facility]  [address] [place] [address] [phone]  [facility]  [address] [place], [place] [address] [phone]  [facility]  [address] [place] [address] [phone]  [facility]  [address] [place] [address] [phone]  [facility]  [address], [place] [address] [phone]  [facility]  [address], [place] [address] [phone]  [facility] EFAX  [address] [place], [place] [phone]      Situation ongoing, CSW to continue following and update chart as more information becomes available.      [name] , MSW, LCSW  09/19/2023 11:33 AM

## 2023-09-19 NOTE — ED Notes (Signed)
 Report given to Tinnie Mania, RN at [facility]

## 2023-09-19 NOTE — ED Notes (Signed)
 Security at bedside, and has given pt his wallet. Pt states I'm going to get a clip and kill all of y'all.There are too many deamons in [place]. Pt shouted homophobic slurs down hallway.

## 2023-09-19 NOTE — ED Notes (Signed)
 Spoke to sheriff at this time and was told that pt could be transported tomorrow.

## 2023-09-19 NOTE — ED Notes (Signed)
 Patient is resting comfortably.

## 2023-09-20 NOTE — ED Notes (Signed)
 IVC Case # [id number]

## 2023-09-20 NOTE — ED Provider Notes (Signed)
 Emergency Medicine Observation Re-evaluation Note  Dylan [name] is a 58 y.o. male, seen on rounds today.  Pt initially presented to the ED for complaints of Suicidal Currently, the patient is resting.  Physical Exam  BP 101/70 (BP Location: Left Arm)   Pulse 62   Temp (!) 97.5 F (36.4 C) (Oral)   Resp 18   SpO2 100%  Physical Exam General: nad Psych: calm  ED Course / MDM  EKG:EKG Interpretation Date/Time:  Wednesday September 19 2023 03:24:31 EDT Ventricular Rate:  100 PR Interval:  142 QRS Duration:  101 QT Interval:  385 QTC Calculation: 497 R Axis:   40  Text Interpretation: Sinus tachycardia LAE, consider biatrial enlargement Left ventricular hypertrophy Borderline prolonged QT interval When compared with ECG of 09/10/2023, No significant change was found Confirmed by Raford Lenis ([id number]) on 09/19/2023 5:46:08 AM  I have reviewed the labs performed to date as well as medications administered while in observation.  Recent changes in the last 24 hours include accepted at [facility], pending transport   Plan  Current plan is for send to [place].    Elnor Jayson LABOR, DO 09/20/23 [phone]

## 2023-09-20 NOTE — ED Notes (Signed)
 Patient off unit to [facility] per provider. Patient alert, and no s/s of distress at time of discharge. Discharge information and belongings given to [facility] for facility  Patient ambulatory. Patient off unit in w/c . Patient escorted and transported by [facility].

## 2023-09-26 LAB — MISC LABCORP TEST (SEND OUT): Labcorp test code: 520085

## 2023-10-03 ENCOUNTER — Encounter (HOSPITAL_COMMUNITY): Payer: Self-pay

## 2023-10-18 ENCOUNTER — Other Ambulatory Visit: Payer: Self-pay

## 2023-10-18 ENCOUNTER — Encounter (HOSPITAL_COMMUNITY): Payer: Self-pay | Admitting: Emergency Medicine

## 2023-10-18 ENCOUNTER — Emergency Department (HOSPITAL_COMMUNITY)
Admission: EM | Admit: 2023-10-18 | Discharge: 2023-10-18 | Discharge status: Against Medical Advice | Payer: MEDICAID | Attending: Emergency Medicine | Admitting: Emergency Medicine

## 2023-10-18 DIAGNOSIS — R42 Dizziness and giddiness: Secondary | ICD-10-CM | POA: Insufficient documentation

## 2023-10-18 DIAGNOSIS — W1839XA Other fall on same level, initial encounter: Secondary | ICD-10-CM | POA: Insufficient documentation

## 2023-10-18 DIAGNOSIS — Z5321 Procedure and treatment not carried out due to patient leaving prior to being seen by health care provider: Secondary | ICD-10-CM | POA: Diagnosis not present

## 2023-10-18 DIAGNOSIS — M25511 Pain in right shoulder: Secondary | ICD-10-CM | POA: Insufficient documentation

## 2023-10-18 LAB — COMPREHENSIVE METABOLIC PANEL WITH GFR
ALT: 41 U/L (ref 0–44)
AST: 49 U/L — ABNORMAL HIGH (ref 15–41)
Albumin: 4 g/dL (ref 3.5–5.0)
Alkaline Phosphatase: 78 U/L (ref 38–126)
Anion gap: 11 (ref 5–15)
BUN: 16 mg/dL (ref 6–20)
CO2: 20 mmol/L — ABNORMAL LOW (ref 22–32)
Calcium: 8.6 mg/dL — ABNORMAL LOW (ref 8.9–10.3)
Chloride: 104 mmol/L (ref 98–111)
Creatinine, Ser: 0.9 mg/dL (ref 0.61–1.24)
GFR, Estimated: >60 mL/min (ref >60)
Glucose, Bld: 90 mg/dL (ref 70–99)
Potassium: 4.7 mmol/L (ref 3.5–5.1)
Sodium: 135 mmol/L (ref 135–145)
Total Bilirubin: 1.1 mg/dL (ref 0.0–1.2)
Total Protein: 7.3 g/dL (ref 6.5–8.1)

## 2023-10-18 LAB — CBC
HCT: 45.4 % (ref 39.0–52.0)
Hemoglobin: 14.8 g/dL (ref 13.0–17.0)
MCH: 30.4 pg (ref 26.0–34.0)
MCHC: 32.6 g/dL (ref 30.0–36.0)
MCV: 93.2 fL (ref 80.0–100.0)
Platelets: 203 K/uL (ref 150–400)
RBC: 4.87 MIL/uL (ref 4.22–5.81)
RDW: 13 % (ref 11.5–15.5)
WBC: 9.4 K/uL (ref 4.0–10.5)
nRBC: 0 % (ref 0.0–0.2)

## 2023-10-18 LAB — CBG MONITORING, ED: Glucose-Capillary: 78 mg/dL (ref 70–99)

## 2023-10-18 NOTE — ED Notes (Signed)
 Patient upset about having to wait for a room. Patient informed of waiting on provider to come evaluate. Patient removed his IV at this time.

## 2023-10-18 NOTE — ED Triage Notes (Signed)
 Per [facility] [place]zy and fell backwards onto the ground. Patient reports feeling foggy now. Administered 400 CC NS. C/o right shoulder pain.

## 2023-10-18 NOTE — ED Notes (Signed)
 Pt states unable to urinate at this time.

## 2023-10-24 ENCOUNTER — Other Ambulatory Visit: Payer: Self-pay

## 2023-10-24 ENCOUNTER — Encounter (HOSPITAL_COMMUNITY): Payer: Self-pay

## 2023-10-24 ENCOUNTER — Encounter (HOSPITAL_COMMUNITY)
Admission: RE | Admit: 2023-10-24 | Discharge: 2023-10-24 | Disposition: A | Discharge status: Home / Self Care | Payer: MEDICAID | Source: Ambulatory Visit | Attending: General Surgery | Admitting: General Surgery

## 2023-10-24 DIAGNOSIS — Z01818 Encounter for other preprocedural examination: Secondary | ICD-10-CM | POA: Diagnosis present

## 2023-10-24 NOTE — Patient Instructions (Signed)
 SURGICAL WAITING ROOM VISITATION  Patients having surgery or a procedure may have no more than 2 support people in the waiting area - these visitors may rotate.    Children under the age of 49 must have an adult with them who is not the patient.  Visitors with respiratory illnesses are discouraged from visiting and should remain at home.  If the patient needs to stay at the hospital during part of their recovery, the visitor guidelines for inpatient rooms apply. Pre-op  nurse will coordinate an appropriate time for 1 support person to accompany patient in pre-op .  This support person may not rotate.    Please refer to the [facility] website for the visitor guidelines for Inpatients (after your surgery is over and you are in a regular room).       Your procedure is scheduled on: 10-25-23   Report to [facility] Main Entrance    Report to admitting at     11:15 AM   Call this number if you have problems the morning of surgery [phone]   Do not eat food :After Midnight.   After Midnight you may have the following liquids until __0730 ____ AM/ DAY OF SURGERY  then nothing by mouth  Water                                                          Sports drinks like Gatorade (NO RED)                             If you have questions, please contact your surgeon's office.   FOLLOW BOWEL PREP AND ANY ADDITIONAL PRE OP INSTRUCTIONS YOU RECEIVED FROM YOUR SURGEON'S OFFICE!!!     Oral Hygiene is also important to reduce your risk of infection.                                    Remember - BRUSH YOUR TEETH THE MORNING OF SURGERY WITH YOUR REGULAR TOOTHPASTE  DENTURES WILL BE REMOVED PRIOR TO SURGERY PLEASE DO NOT APPLY Poly grip OR ADHESIVES!!!   Do NOT smoke after Midnight   Stop all vitamins and herbal supplements 7 days before surgery.   Take these medicines the morning of surgery with A SIP OF WATER : Biktarvy  , Gabapentin   DO NOT TAKE ANY ORAL DIABETIC  MEDICATIONS DAY OF YOUR SURGERY  Bring CPAP mask and tubing day of surgery.                              You may not have any metal on your body including hair pins, jewelry, and body piercing             Do not wear lotions, powders, perfumes/cologne, or deodorant   .               Men may shave face and neck.   Do not bring valuables to the hospital. [name] IS NOT             RESPONSIBLE   FOR VALUABLES.   Contacts, glasses, dentures or bridgework may not be worn into surgery.  Bring small overnight bag day of surgery.   DO NOT BRING YOUR HOME MEDICATIONS TO THE HOSPITAL. PHARMACY WILL DISPENSE MEDICATIONS LISTED ON YOUR MEDICATION LIST TO YOU DURING YOUR ADMISSION IN THE HOSPITAL!    Patients discharged on the day of surgery will not be allowed to drive home.  Someone NEEDS to stay with you for the first 24 hours after anesthesia.   Special Instructions: Bring a copy of your healthcare power of attorney and living will documents the day of surgery if you haven't scanned them before.              Please read over the following fact sheets you were given: IF YOU HAVE QUESTIONS ABOUT YOUR PRE-OP  INSTRUCTIONS PLEASE CALL [phone].   I If you test positive for Covid or have been in contact with anyone that has tested positive in the last 10 days please notify you surgeon. [facility] - Preparing for Surgery Before surgery, you can play an important role.  Because skin is not sterile, your skin needs to be as free of germs as possible.  You can reduce the number of germs on your skin by washing with CHG (chlorahexidine gluconate) soap before surgery.  CHG is an antiseptic cleaner which kills germs and bonds with the skin to continue killing germs even after washing. Please DO NOT use if you have an allergy to CHG or antibacterial soaps.  If your skin becomes reddened/irritated stop using the CHG and inform your nurse when you arrive at Short Stay. Do not shave (including legs and  underarms) for at least 48 hours prior to the first CHG shower.  You may shave your face/neck.  Please follow these instructions carefully:  1.  Shower with CHG Soap the night before surgery ONLY (DO NOT USE THE SOAP THE MORNING OF SURGERY).  2.  If you choose to wash your hair, wash your hair first as usual with your normal  shampoo.  3.  After you shampoo, rinse your hair and body thoroughly to remove the shampoo.                             4.  Use CHG as you would any other liquid soap.  You can apply chg directly to the skin and wash.  Gently with a scrungie or clean washcloth.  5.  Apply the CHG Soap to your body ONLY FROM THE NECK DOWN.   Do  not use on face/ open                           Wound or open sores. Avoid contact with eyes, ears mouth and genitals (private parts).                       Wash face,  Genitals (private parts) with your normal soap             6.  Wash thoroughly, paying special attention to the area where your  surgery  will be performed.  7.  Thoroughly rinse your body with warm water  from the neck down.  8.  DO NOT shower/wash with your normal soap after using and rinsing off the CHG Soap.                9.  Pat yourself dry with a clean towel.  10.  Wear clean pajamas.            11.  Place clean sheets on your bed the night of your first shower and do not  sleep with pets. Day of Surgery : Do not apply any CHG, lotions/deodorants the morning of surgery.  Please wear clean clothes to the hospital/surgery center.  FAILURE TO FOLLOW THESE INSTRUCTIONS MAY RESULT IN THE CANCELLATION OF YOUR SURGERY  PATIENT SIGNATURE_________________________________  NURSE SIGNATURE__________________________________  ________________________________________________________________________

## 2023-10-24 NOTE — Progress Notes (Addendum)
 PCP - Dr. Delbert PADDY 06-25-23 Epic  Cardiologist - Mallipeddi, Vishnu,MD LOV 11-20-22 epic  PPM/ICD -  Device Orders -  Rep Notified -   Chest x-ray - 1v 08-07-23 epic EKG - 09-19-23 epic Stress Test -  ECHO - 06-02-22 epic Cardiac Cath -  LABS-10-18-23 epic    -CBC,CMP  Sleep Study - n/a CPAP - n/a  Fasting Blood Sugar -  Checks Blood Sugar __n/a___ times a day  Blood Thinner Instructions: Aspirin  Instructions:n/a  ERAS Protcol - PRE-SURGERY n/a   COVID vaccine -  Activity--Able to climb a flight of stairs with no CP or SOB  Anesthesia review:  Cocaine induced Rhabdomylosis, Pre-DM , HIV, HTN, schizoaffective disorder, substance abuse, In ED 10-18-23 epic  pt. States he thinks had a mild case of food poisoning  Patient denies shortness of breath, fever, cough and chest pain at PAT appointment   All instructions explained to the patient, with a verbal understanding of the material. Patient agrees to go over the instructions while at home for a better understanding. Patient also instructed to self quarantine after being tested for COVID-19. The opportunity to ask questions was provided.

## 2023-10-25 ENCOUNTER — Encounter (HOSPITAL_COMMUNITY): Payer: Self-pay | Admitting: General Surgery

## 2023-10-25 ENCOUNTER — Ambulatory Visit (HOSPITAL_COMMUNITY)
Admission: RE | Admit: 2023-10-25 | Discharge: 2023-10-25 | Disposition: A | Discharge status: Home / Self Care | Payer: MEDICAID | Attending: General Surgery | Admitting: General Surgery

## 2023-10-25 ENCOUNTER — Encounter (HOSPITAL_COMMUNITY)
Admission: RE | Disposition: A | Discharge status: Home / Self Care | Payer: Self-pay | Source: Home / Self Care | Attending: General Surgery

## 2023-10-25 ENCOUNTER — Ambulatory Visit (HOSPITAL_COMMUNITY): Payer: MEDICAID | Admitting: Anesthesiology

## 2023-10-25 ENCOUNTER — Encounter (HOSPITAL_COMMUNITY): Payer: MEDICAID | Admitting: Physician Assistant

## 2023-10-25 DIAGNOSIS — F17210 Nicotine dependence, cigarettes, uncomplicated: Secondary | ICD-10-CM | POA: Diagnosis not present

## 2023-10-25 DIAGNOSIS — F418 Other specified anxiety disorders: Secondary | ICD-10-CM

## 2023-10-25 DIAGNOSIS — K6282 Dysplasia of anus: Secondary | ICD-10-CM | POA: Insufficient documentation

## 2023-10-25 DIAGNOSIS — F17290 Nicotine dependence, other tobacco product, uncomplicated: Secondary | ICD-10-CM | POA: Insufficient documentation

## 2023-10-25 DIAGNOSIS — I252 Old myocardial infarction: Secondary | ICD-10-CM | POA: Diagnosis not present

## 2023-10-25 DIAGNOSIS — I10 Essential (primary) hypertension: Secondary | ICD-10-CM

## 2023-10-25 DIAGNOSIS — D013 Carcinoma in situ of anus and anal canal: Secondary | ICD-10-CM | POA: Diagnosis not present

## 2023-10-25 DIAGNOSIS — Z86004 Personal history of in-situ neoplasm of other and unspecified digestive organs: Secondary | ICD-10-CM | POA: Diagnosis not present

## 2023-10-25 HISTORY — PX: LASER ABLATION CONDOLAMATA: SHX5941

## 2023-10-25 SURGERY — ABLATION, CONDYLOMA, USING LASER
Anesthesia: Monitor Anesthesia Care

## 2023-10-25 MED ORDER — ACETAMINOPHEN 325 MG PO TABS: 650.0000 mg | ORAL_TABLET | ORAL | Status: DC | PRN

## 2023-10-25 MED ORDER — DEXMEDETOMIDINE HCL IN NACL 80 MCG/20ML IV SOLN
INTRAVENOUS | Status: DC | PRN
  Administered 2023-10-25: 12 ug via INTRAVENOUS

## 2023-10-25 MED ORDER — ACETAMINOPHEN 650 MG RE SUPP: 650.0000 mg | RECTAL | Status: DC | PRN

## 2023-10-25 MED ORDER — LIDOCAINE 5 % EX OINT: 1.0000 | TOPICAL_OINTMENT | CUTANEOUS | 0 refills | Status: AC | PRN

## 2023-10-25 MED ORDER — MIDAZOLAM HCL 5 MG/5ML IJ SOLN
INTRAMUSCULAR | Status: DC | PRN
  Administered 2023-10-25: 2 mg via INTRAVENOUS

## 2023-10-25 MED ORDER — ACETIC ACID 5 % SOLN
Status: DC | PRN
  Administered 2023-10-25: 1 via TOPICAL

## 2023-10-25 MED ORDER — LACTATED RINGERS IV SOLN: INTRAVENOUS | Status: DC | PRN

## 2023-10-25 MED ORDER — ONDANSETRON HCL 4 MG/2ML IJ SOLN
INTRAMUSCULAR | Status: DC | PRN
  Administered 2023-10-25: 4 mg via INTRAVENOUS

## 2023-10-25 MED ORDER — BUPIVACAINE-EPINEPHRINE (PF) 0.5% -1:200000 IJ SOLN
INTRAMUSCULAR | Status: AC
  Filled 2023-10-25: qty 30

## 2023-10-25 MED ORDER — PROPOFOL 500 MG/50ML IV EMUL
INTRAVENOUS | Status: DC | PRN
  Administered 2023-10-25: 125 ug/kg/min via INTRAVENOUS

## 2023-10-25 MED ORDER — PROPOFOL 500 MG/50ML IV EMUL
INTRAVENOUS | Status: AC
  Filled 2023-10-25: qty 300

## 2023-10-25 MED ORDER — BUPIVACAINE-EPINEPHRINE 0.5% -1:200000 IJ SOLN
INTRAMUSCULAR | Status: DC | PRN
  Administered 2023-10-25: 30 mL

## 2023-10-25 MED ORDER — ACETIC ACID 5 % SOLN
Status: AC
  Filled 2023-10-25: qty 60

## 2023-10-25 MED ORDER — SODIUM CHLORIDE 0.9% FLUSH: 3.0000 mL | Freq: Two times a day (BID) | INTRAVENOUS | Status: DC

## 2023-10-25 MED ORDER — FENTANYL CITRATE (PF) 50 MCG/ML IJ SOSY: 25.0000 ug | PREFILLED_SYRINGE | INTRAMUSCULAR | Status: DC | PRN

## 2023-10-25 MED ORDER — FENTANYL CITRATE (PF) 100 MCG/2ML IJ SOLN
INTRAMUSCULAR | Status: DC | PRN
  Administered 2023-10-25 (×2): 50 ug via INTRAVENOUS

## 2023-10-25 MED ORDER — SODIUM CHLORIDE 0.9% FLUSH: 3.0000 mL | INTRAVENOUS | Status: DC | PRN

## 2023-10-25 MED ORDER — BUPIVACAINE LIPOSOME 1.3 % IJ SUSP: 20.0000 mL | Freq: Once | INTRAMUSCULAR | Status: DC

## 2023-10-25 MED ORDER — CHLORHEXIDINE GLUCONATE 0.12 % MT SOLN
15.0000 mL | Freq: Once | OROMUCOSAL | Status: AC
  Administered 2023-10-25: 15 mL via OROMUCOSAL

## 2023-10-25 MED ORDER — TRAMADOL HCL 50 MG PO TABS: 50.0000 mg | ORAL_TABLET | Freq: Four times a day (QID) | ORAL | 0 refills | Status: DC | PRN

## 2023-10-25 MED ORDER — ORAL CARE MOUTH RINSE: 15.0000 mL | Freq: Once | OROMUCOSAL | Status: AC

## 2023-10-25 MED ORDER — ONDANSETRON HCL 4 MG/2ML IJ SOLN: 4.0000 mg | Freq: Once | INTRAMUSCULAR | Status: DC | PRN

## 2023-10-25 MED ORDER — OXYCODONE HCL 5 MG PO TABS: 5.0000 mg | ORAL_TABLET | ORAL | Status: DC | PRN

## 2023-10-25 MED ORDER — FENTANYL CITRATE (PF) 100 MCG/2ML IJ SOLN
INTRAMUSCULAR | Status: AC
  Filled 2023-10-25: qty 2

## 2023-10-25 MED ORDER — MIDAZOLAM HCL 2 MG/2ML IJ SOLN
INTRAMUSCULAR | Status: AC
  Filled 2023-10-25: qty 2

## 2023-10-25 MED ORDER — PROPOFOL 10 MG/ML IV BOLUS
INTRAVENOUS | Status: DC | PRN
Start: 2023-10-25 — End: 2023-10-25
  Administered 2023-10-25: 50 mg via INTRAVENOUS

## 2023-10-25 MED ORDER — LACTATED RINGERS IV SOLN: INTRAVENOUS | Status: DC

## 2023-10-25 MED ORDER — SODIUM CHLORIDE 0.9 % IV SOLN: 250.0000 mL | INTRAVENOUS | Status: DC | PRN

## 2023-10-25 SURGICAL SUPPLY — 39 items
BAG COUNTER SPONGE SURGICOUNT (BAG) IMPLANT
BLADE HEX COATED 2.75 (ELECTRODE) ×1 IMPLANT
BLADE SURG 15 STRL LF DISP TIS (BLADE) ×1 IMPLANT
BRIEF MESH DISP LRG (UNDERPADS AND DIAPERS) ×1 IMPLANT
COVER BACK TABLE 60X90IN (DRAPES) ×1 IMPLANT
COVER MAYO STAND STRL (DRAPES) ×1 IMPLANT
COVER SURGICAL LIGHT HANDLE (MISCELLANEOUS) ×1 IMPLANT
DRAPE LAPAROTOMY T 98X78 PEDS (DRAPES) ×1 IMPLANT
DRAPE SHEET LG 3/4 BI-LAMINATE (DRAPES) IMPLANT
DRAPE UTILITY XL STRL (DRAPES) ×1 IMPLANT
DRSG VASELINE 3X18 (GAUZE/BANDAGES/DRESSINGS) IMPLANT
ELECT REM PT RETURN 15FT ADLT (MISCELLANEOUS) ×1 IMPLANT
GAUZE 4X4 16PLY ~~LOC~~+RFID DBL (SPONGE) IMPLANT
GAUZE PAD ABD 8X10 STRL (GAUZE/BANDAGES/DRESSINGS) ×1 IMPLANT
GAUZE SPONGE 4X4 12PLY STRL (GAUZE/BANDAGES/DRESSINGS) IMPLANT
GLOVE BIO SURGEON STRL SZ 6.5 (GLOVE) ×1 IMPLANT
GLOVE INDICATOR 6.5 STRL GRN (GLOVE) ×1 IMPLANT
GOWN STRL REUS W/ TWL LRG LVL3 (GOWN DISPOSABLE) ×1 IMPLANT
GOWN STRL REUS W/ TWL XL LVL3 (GOWN DISPOSABLE) ×2 IMPLANT
KIT BASIN OR (CUSTOM PROCEDURE TRAY) ×1 IMPLANT
KIT TURNOVER KIT A (KITS) ×1 IMPLANT
MANIFOLD NEPTUNE II (INSTRUMENTS) IMPLANT
NDL HYPO 25X1 1.5 SAFETY (NEEDLE) ×1 IMPLANT
NDL SAFETY ECLIPSE 18X1.5 (NEEDLE) IMPLANT
NEEDLE HYPO 25X1 1.5 SAFETY (NEEDLE) ×1 IMPLANT
PENCIL SMOKE EVACUATOR (MISCELLANEOUS) IMPLANT
PROTECTOR NERVE ULNAR (MISCELLANEOUS) IMPLANT
SPIKE FLUID TRANSFER (MISCELLANEOUS) ×1 IMPLANT
SPONGE SURGIFOAM ABS GEL 12-7 (HEMOSTASIS) IMPLANT
SUT CHROMIC 2 0 SH (SUTURE) IMPLANT
SUT CHROMIC 3 0 SH 27 (SUTURE) IMPLANT
SUT MON AB 3-0 SH27 (SUTURE) IMPLANT
SUT VIC AB 4-0 P-3 18XBRD (SUTURE) IMPLANT
SYR CONTROL 10ML LL (SYRINGE) ×1 IMPLANT
TOWEL OR 17X26 10 PK STRL BLUE (TOWEL DISPOSABLE) ×2 IMPLANT
TUBING CONNECTING 10 (TUBING) ×1 IMPLANT
VACUUM HOSE 7/8X10 W/ WAND (MISCELLANEOUS) ×1 IMPLANT
WATER STERILE IRR 500ML POUR (IV SOLUTION) ×1 IMPLANT
YANKAUER SUCT BULB TIP NO VENT (SUCTIONS) ×1 IMPLANT

## 2023-10-25 NOTE — Op Note (Signed)
 10/25/2023  1:30 PM  PATIENT:  Dylan [name]  58 y.o. male  Patient Care Team: Delbert Clam, MD as PCP - General (Family Medicine) Luiz Channel, MD as Consulting Physician (Infectious Diseases)  PRE-OPERATIVE DIAGNOSIS:  AIN III  POST-OPERATIVE DIAGNOSIS:  AIN III  PROCEDURE:  LASER ABLATION and BIOPSY of ANAL CANAL MASS    Surgeon(s): Debby Hila, MD  ASSISTANT: none   ANESTHESIA:   local and MAC  SPECIMEN:  Source of Specimen:  anal canal mass  DISPOSITION OF SPECIMEN:  PATHOLOGY  COUNTS:  YES  PLAN OF CARE: Discharge to home after PACU  PATIENT DISPOSITION:  PACU - hemodynamically stable.  INDICATION: 58 y.o. 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  MAC anesthesia was induced without difficulty. The patient was then positioned in prone jackknife position with buttocks gently taped apart.  The patient was then prepped and draped in usual sterile fashion.  SCDs were noted to be in place prior to the initiation of anesthesia. A surgical timeout was performed indicating the correct patient, procedure, positioning and need for preoperative antibiotics.  A rectal block was performed using Marcaine  with epinephrine .    I began with a digital rectal exam.  The anal beatriz was gently dilated.  I then placed a Hill-Ferguson anoscope into the anal canal and evaluated this completely.  There were carpeting lesions of the L posterior fistulotomy site only.  A biopsy was taken sharply.      Next the laser was brought onto the field.  The edges of the operative field were draped with wet towels.  Appropriate ventilation was obtained.  All staff were protected with small particle masks and goggles safe for the laser.  The laser was then activated.  All condylomatous lesions were ablated. Hemostasis was then achieved using electrocautery. A sterile dressing was applied over this. The patient was then awakened from anesthesia and sent to the postanesthesia care unit in stable condition. All counts were correct operating room staff.  Hila JAYSON Debby, MD  Colorectal and General Surgery [facility]

## 2023-10-25 NOTE — H&P (Signed)
 HPI: Dylan [name] is an 58 y.o. male who is here for AIN seen on anal Pap test.  He underwent high-resolution anoscopy and was noted to have 2 anal lesions.  1 was situated within a fistula track and the other was noted in the right anterior anal canal.  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 Unfortunately the lesion that was in the fistula tract has recurred.    Past Medical History:  Diagnosis Date   Acute kidney failure    2019 from Rhabdomylosis   Anxiety    Arthritis    Bipolar 1 disorder (HCC)    Colon polyps    Depression    GERD (gastroesophageal reflux disease)    Hepatitis B    Human immunodeficiency virus (HIV) (HCC)    Hyperlipidemia    Insomnia due to other mental disorder 02/03/2020   Intentional drug overdose (HCC) 11/26/2021   Neuromuscular disorder (HCC)    neuropathy   Neuropathy    Pre-diabetes    Rhabdomyolysis 02/19/2017   Unilateral primary osteoarthritis, left knee 12/14/2021    Past Surgical History:  Procedure Laterality Date   ANAL FISTULOTOMY  07/27/2023   Procedure: ANAL FISTULOTOMY;  Surgeon: Debby Hila, MD;  Location: [facility];  Service: General;;   COLONOSCOPY     FOOT ARTHRODESIS Right 09/17/2019   Procedure: FUSION RIGHT LISFRANC JOINT;  Surgeon: Harden Jerona GAILS, MD;  Location: [facility] OR;  Service: Orthopedics;  Laterality: Right;   FOOT ARTHRODESIS Right 09/2019   HEMORROIDECTOMY     HIGH RESOLUTION ANOSCOPY N/A 07/27/2023   Procedure: ANOSCOPY, HIGH RESOLUTION;  Surgeon: Debby Hila, MD;  Location: [facility];  Service: General;  Laterality: N/A;   IR FLUORO GUIDE CV LINE RIGHT  02/22/2017   IR US  GUIDE VASC ACCESS RIGHT  02/22/2017   RECTAL BIOPSY N/A 07/27/2023   Procedure: BIOPSY AND ABLATION, RECTUM;  Surgeon: Debby Hila, MD;  Location: [facility];  Service: General;  Laterality: N/A;   TOTAL KNEE ARTHROPLASTY Left 12/14/2021   Procedure: LEFT TOTAL KNEE ARTHROPLASTY;   Surgeon: Harden Jerona GAILS, MD;  Location: [facility] OR;  Service: Orthopedics;  Laterality: Left;   TUMOR REMOVAL     From Chest    Family History  Problem Relation Age of Onset   CAD Mother    Diabetes Father    Stroke Father    Colon cancer Maternal Aunt 60   Diabetes Maternal Aunt    Heart disease Maternal Uncle    Heart attack Maternal Uncle    Esophageal cancer Neg Hx    Rectal cancer Neg Hx    Stomach cancer Neg Hx     Social:  reports that he has been smoking e-cigarettes and cigarettes. He has a 12.5 pack-year smoking history. He has never used smokeless tobacco. He reports current alcohol use. He reports that he does not currently use drugs after having used the following drugs: Cocaine and Marijuana.  Allergies:  Allergies  Allergen Reactions   Penicillins Other (See Comments)    Reaction type/severity unknown, childhood allergy; pt cannot recall all symptoms   Latex Rash   Tape Rash    Medications: I have reviewed the patient's current medications.  No results found for this or any previous visit (from the past 48 hours).  No results found.  ROS - all of the below systems have been reviewed with the patient and positives are indicated with bold text General: chills, fever or night sweats  Eyes: blurry vision or double vision ENT: epistaxis or sore throat Hematologic/Lymphatic: bleeding problems, blood clots or swollen lymph nodes Endocrine: temperature intolerance or unexpected weight changes Resp: cough, shortness of breath, or wheezing CV: chest pain or dyspnea on exertion GI: as per HPI GU: dysuria, trouble voiding, or hematuria Neuro: TIA or stroke symptoms    PE Blood pressure 130/82, pulse 63, temperature 98.3 F (36.8 C), temperature source Oral, resp. rate 18, height 6' 2 (1.88 m), weight 102.5 kg, SpO2 99%. Constitutional: NAD; conversant; no deformities Eyes: Moist conjunctiva; no lid lag; anicteric; PERRL Neck: Trachea midline; no  thyromegaly Lungs: Normal respiratory effort CV: RRR GI: Abd soft MSK: Normal range of motion of extremities; no clubbing/cyanosis Psychiatric: Appropriate affect; alert and oriented x3    A/P: Dylan [name] is an 58 y.o. male with what appears to be recurrent AIN.  I recommended return to the OR with laser ablation and repeat biopsy.  This was discussed in detail.  Risk include bleeding pain and recurrence.  All questions were answered.     Bernarda JAYSON Ned, MD  Colorectal and General Surgery [facility]

## 2023-10-25 NOTE — Transfer of Care (Signed)
 Immediate Anesthesia Transfer of Care Note  Patient: Dylan [name]  Procedure(s) Performed: ABLATION, CONDYLOMA, USING LASER  Patient Location: PACU  Anesthesia Type:MAC  Level of Consciousness: awake and alert   Airway & Oxygen Therapy: Patient Spontanous Breathing and Patient connected to nasal cannula oxygen  Post-op Assessment: Report given to RN and Post -op Vital signs reviewed and stable  Post vital signs: Reviewed and stable  Last Vitals:  Vitals Value Taken Time  BP    Temp    Pulse 76 10/25/23 13:40  Resp 14 10/25/23 13:40  SpO2 94 % 10/25/23 13:40  Vitals shown include unfiled device data.  Last Pain:  Vitals:   10/25/23 1116  TempSrc:   PainSc: 0-No pain         Complications: No notable events documented.

## 2023-10-25 NOTE — Discharge Instructions (Addendum)
ANORECTAL SURGERY: POST OP INSTRUCTIONS Take your usually prescribed home medications unless otherwise directed. DIET: During the first few hours after surgery sip on some liquids until you are able to urinate.  It is normal to not urinate for several hours after this surgery.  If you feel uncomfortable, please contact the office for instructions.  After you are able to urinate,you may eat, if you feel like it.  Follow a light bland diet the first 24 hours after arrival home, such as soup, liquids, crackers, etc.  Be sure to include lots of fluids daily (6-8 glasses).  Avoid fast food or heavy meals, as your are more likely to get nauseated.  Eat a low fat diet the next few days after surgery.  Limit caffeine intake to 1-2 servings a day. PAIN CONTROL: Pain is best controlled by a usual combination of several different methods TOGETHER: Muscle relaxation: Soak in a warm bath (or Sitz bath) three times a day and after bowel movements.  Continue to do this until all pain is resolved. Over the counter pain medication Prescription pain medication Most patients will experience some swelling and discomfort in the anus/rectal area and incisions.  Heat such as warm towels, sitz baths, warm baths, etc to help relax tight/sore spots and speed recovery.  Some people prefer to use ice, especially in the first couple days after surgery, as it may decrease the pain and swelling, or alternate between ice & heat.  Experiment to what works for you.  Swelling and bruising can take several weeks to resolve.  Pain can take even longer to completely resolve. It is helpful to take an over-the-counter pain medication regularly for the first few weeks.  Choose one of the following that works best for you: Naproxen (Aleve, etc)  Two 220mg tabs twice a day Ibuprofen (Advil, etc) Three 200mg tabs four times a day (every meal & bedtime) A  prescription for pain medication (such as percocet, oxycodone, hydrocodone, etc) should be  given to you upon discharge.  Take your pain medication as prescribed.  If you are having problems/concerns with the prescription medicine (does not control pain, nausea, vomiting, rash, itching, etc), please call us [phone] to see if we need to switch you to a different pain medicine that will work better for you and/or control your side effect better. If you need a refill on your pain medication, please contact your pharmacy.  They will contact our office to request authorization. Prescriptions will not be filled after 5 pm or on week-ends. KEEP YOUR BOWELS REGULAR and AVOID CONSTIPATION The goal is one to two soft bowel movements a day.  You should at least have a bowel movement every other day. Avoid getting constipated.  Between the surgery and the pain medications, it is common to experience some constipation. This can be very painful after rectal surgery.  Increasing fluid intake and taking a fiber supplement (such as Metamucil, Citrucel, FiberCon, etc) 1-2 times a day regularly will usually help prevent this problem from occurring.  A stool softener like colace is also recommended.  This can be purchased over the counter at your pharmacy.  You can take it up to 3 times a day.  If you do not have a bowel movement after 24 hrs since your surgery, take one does of milk of magnesia.  If you still haven't had a bowel movement 8-12 hours after that dose, take another dose.  If you don't have a bowel movement 48 hrs after surgery,   purchase a Fleets enema from the drug store and administer gently per package instructions.  If you still are having trouble with your bowel movements after that, please call the office for further instructions. If you develop diarrhea or have many loose bowel movements, simplify your diet to bland foods & liquids for a few days.  Stop any stool softeners and decrease your fiber supplement.  Switching to mild anti-diarrheal medications (Kayopectate, Pepto Bismol) can help.   If this worsens or does not improve, please call us.  Wound Care Remove your bandages before your first bowel movement or 8 hours after surgery.     Remove any wound packing material at this tim,e as well.  You do not need to repack the wound unless instructed otherwise.  Wear an absorbent pad or soft cotton gauze in your underwear to catch any drainage and help keep the area clean. You should change this every 2-3 hours while awake. Keep the area clean and dry.  Bathe / shower every day, especially after bowel movements.  Keep the area clean by showering / bathing over the incision / wound.   It is okay to soak an open wound to help wash it.  Wet wipes or showers / gentle washing after bowel movements is often less traumatic than regular toilet paper. You may have some styrofoam-like soft packing in the rectum which will come out with the first bowel movement.  You will often notice bleeding with bowel movements.  This should slow down by the end of the first week of surgery Expect some drainage.  This should slow down, too, by the end of the first week of surgery.  Wear an absorbent pad or soft cotton gauze in your underwear until the drainage stops. Do Not sit on a rubber or pillow ring.  This can make you symptoms worse.  You may sit on a soft pillow if needed.  ACTIVITIES as tolerated:   You may resume regular (light) daily activities beginning the next day--such as daily self-care, walking, climbing stairs--gradually increasing activities as tolerated.  If you can walk 30 minutes without difficulty, it is safe to try more intense activity such as jogging, treadmill, bicycling, low-impact aerobics, swimming, etc. Save the most intensive and strenuous activity for last such as sit-ups, heavy lifting, contact sports, etc  Refrain from any heavy lifting or straining until you are off narcotics for pain control.   You may drive when you are no longer taking prescription pain medication, you can  comfortably sit for long periods of time, and you can safely maneuver your car and apply brakes. You may have sexual intercourse when it is comfortable.  FOLLOW UP in our office Please call [facility] at [phone] to set up an appointment to see your surgeon in the office for a follow-up appointment approximately 3-4 weeks after your surgery. Make sure that you call for this appointment the day you arrive home to insure a convenient appointment time. 10. IF YOU HAVE DISABILITY OR FAMILY LEAVE FORMS, BRING THEM TO THE OFFICE FOR PROCESSING.  DO NOT GIVE THEM TO YOUR DOCTOR.     WHEN TO CALL US [phone]: Poor pain control Reactions / problems with new medications (rash/itching, nausea, etc)  Fever over 101.5 F (38.5 C) Inability to urinate Nausea and/or vomiting Worsening swelling or bruising Continued bleeding from incision. Increased pain, redness, or drainage from the incision  The clinic staff is available to answer your questions during regular business hours (8:30am-5pm).    Please don't hesitate to call and ask to speak to one of our nurses for clinical concerns.   A surgeon from [facility] is always on call at the hospitals   If you have a medical emergency, go to the nearest emergency room or call 911.    [facility] [address], [address], [place],   [address] ? MAIN: [phone] ? TOLL FREE: [phone] ? FAX [phone] [email or web address]    

## 2023-10-25 NOTE — Anesthesia Postprocedure Evaluation (Signed)
 Anesthesia Post Note  Patient: Dylan [name]  Procedure(s) Performed: ABLATION, CONDYLOMA, USING LASER     Patient location during evaluation: PACU Anesthesia Type: MAC Level of consciousness: awake and alert Pain management: pain level controlled Vital Signs Assessment: post-procedure vital signs reviewed and stable Respiratory status: spontaneous breathing, nonlabored ventilation and respiratory function stable Cardiovascular status: stable and blood pressure returned to baseline Postop Assessment: no apparent nausea or vomiting Anesthetic complications: no   No notable events documented.  Last Vitals:  Vitals:   10/25/23 1421 10/25/23 1430  BP:  115/86  Pulse: 65 60  Resp: 14   Temp: (!) 36.1 C   SpO2: 96% 97%    Last Pain:  Vitals:   10/25/23 1430  TempSrc:   PainSc: 1                  Garnette FORBES Skillern

## 2023-10-25 NOTE — Anesthesia Preprocedure Evaluation (Addendum)
 Anesthesia Evaluation  Patient identified by MRN, date of birth, ID band Patient awake    Reviewed: Allergy & Precautions, NPO status , Patient's Chart, lab work & pertinent test results  Airway Mallampati: II  TM Distance: >3 FB Neck ROM: Full    Dental  (+) Dental Advisory Given, Poor Dentition   Pulmonary Current SmokerPatient did not abstain from smoking.   Pulmonary exam normal breath sounds clear to auscultation       Cardiovascular hypertension, + Past MI  Normal cardiovascular exam Rhythm:Regular Rate:Normal     Neuro/Psych  PSYCHIATRIC DISORDERS Anxiety Depression Bipolar Disorder    Neuromuscular disease    GI/Hepatic ,GERD  ,,(+) Hepatitis -, BAnal intraepithelial neoplasia III    Endo/Other  negative endocrine ROS    Renal/GU Renal disease     Musculoskeletal  (+) Arthritis ,    Abdominal   Peds  Hematology  (+) HIV  Anesthesia Other Findings Day of surgery medications reviewed with the patient.  Reproductive/Obstetrics                              Anesthesia Physical Anesthesia Plan  ASA: 3  Anesthesia Plan: MAC   Post-op Pain Management: Minimal or no pain anticipated   Induction: Intravenous  PONV Risk Score and Plan: 0 and TIVA and Treatment may vary due to age or medical condition  Airway Management Planned: Natural Airway and Simple Face Mask  Additional Equipment:   Intra-op Plan:   Post-operative Plan:   Informed Consent: I have reviewed the patients History and Physical, chart, labs and discussed the procedure including the risks, benefits and alternatives for the proposed anesthesia with the patient or authorized representative who has indicated his/her understanding and acceptance.     Dental advisory given  Plan Discussed with: CRNA  Anesthesia Plan Comments:          Anesthesia Quick Evaluation

## 2023-10-26 ENCOUNTER — Encounter (HOSPITAL_COMMUNITY): Payer: Self-pay | Admitting: General Surgery

## 2023-10-30 LAB — SURGICAL PATHOLOGY

## 2023-11-04 ENCOUNTER — Emergency Department (HOSPITAL_COMMUNITY)
Admission: EM | Admit: 2023-11-04 | Discharge: 2023-11-04 | Discharge status: Against Medical Advice | Payer: MEDICAID | Attending: Emergency Medicine | Admitting: Emergency Medicine

## 2023-11-04 DIAGNOSIS — Z5321 Procedure and treatment not carried out due to patient leaving prior to being seen by health care provider: Secondary | ICD-10-CM | POA: Insufficient documentation

## 2023-11-04 DIAGNOSIS — R079 Chest pain, unspecified: Secondary | ICD-10-CM | POA: Diagnosis present

## 2023-11-04 NOTE — ED Triage Notes (Signed)
 Pt BIB EMS with c/o CP. Cp is central and constant. Pt has had this before and has been heartburn but has not been taking meds. Pt is 1 week post colorectal surgery and still having painful bm with some blood. Bp 140/70 hr 90 cbg 90

## 2023-11-04 NOTE — ED Notes (Signed)
 Pt walked out. Tried to have pt stay and at least be assessed by PA  but pt refused. Vss here are 98.0, hr 93, bp 131/106 spo2 98 rr16

## 2023-11-07 ENCOUNTER — Encounter (HOSPITAL_COMMUNITY): Payer: Self-pay

## 2023-11-07 ENCOUNTER — Inpatient Hospital Stay (HOSPITAL_COMMUNITY)
Admission: EM | Admit: 2023-11-07 | Discharge: 2023-11-10 | DRG: 558 | Disposition: A | Discharge status: Home / Self Care | Payer: MEDICAID | Attending: Internal Medicine | Admitting: Internal Medicine

## 2023-11-07 ENCOUNTER — Other Ambulatory Visit: Payer: Self-pay

## 2023-11-07 DIAGNOSIS — Z91048 Other nonmedicinal substance allergy status: Secondary | ICD-10-CM

## 2023-11-07 DIAGNOSIS — R7989 Other specified abnormal findings of blood chemistry: Secondary | ICD-10-CM | POA: Diagnosis present

## 2023-11-07 DIAGNOSIS — F17210 Nicotine dependence, cigarettes, uncomplicated: Secondary | ICD-10-CM | POA: Diagnosis present

## 2023-11-07 DIAGNOSIS — Z91040 Latex allergy status: Secondary | ICD-10-CM

## 2023-11-07 DIAGNOSIS — Z8249 Family history of ischemic heart disease and other diseases of the circulatory system: Secondary | ICD-10-CM

## 2023-11-07 DIAGNOSIS — R001 Bradycardia, unspecified: Secondary | ICD-10-CM | POA: Diagnosis not present

## 2023-11-07 DIAGNOSIS — Z833 Family history of diabetes mellitus: Secondary | ICD-10-CM

## 2023-11-07 DIAGNOSIS — R82998 Other abnormal findings in urine: Principal | ICD-10-CM

## 2023-11-07 DIAGNOSIS — Z96652 Presence of left artificial knee joint: Secondary | ICD-10-CM | POA: Diagnosis present

## 2023-11-07 DIAGNOSIS — M6282 Rhabdomyolysis: Principal | ICD-10-CM | POA: Diagnosis present

## 2023-11-07 DIAGNOSIS — K219 Gastro-esophageal reflux disease without esophagitis: Secondary | ICD-10-CM | POA: Diagnosis present

## 2023-11-07 DIAGNOSIS — Z91199 Patient's noncompliance with other medical treatment and regimen due to unspecified reason: Secondary | ICD-10-CM

## 2023-11-07 DIAGNOSIS — Z880 Allergy status to penicillin: Secondary | ICD-10-CM

## 2023-11-07 DIAGNOSIS — Z981 Arthrodesis status: Secondary | ICD-10-CM

## 2023-11-07 DIAGNOSIS — E785 Hyperlipidemia, unspecified: Secondary | ICD-10-CM | POA: Diagnosis present

## 2023-11-07 DIAGNOSIS — R7303 Prediabetes: Secondary | ICD-10-CM | POA: Diagnosis present

## 2023-11-07 DIAGNOSIS — B181 Chronic viral hepatitis B without delta-agent: Secondary | ICD-10-CM | POA: Diagnosis present

## 2023-11-07 DIAGNOSIS — F319 Bipolar disorder, unspecified: Secondary | ICD-10-CM | POA: Diagnosis present

## 2023-11-07 DIAGNOSIS — B20 Human immunodeficiency virus [HIV] disease: Secondary | ICD-10-CM | POA: Diagnosis present

## 2023-11-07 DIAGNOSIS — Z823 Family history of stroke: Secondary | ICD-10-CM

## 2023-11-07 DIAGNOSIS — F17290 Nicotine dependence, other tobacco product, uncomplicated: Secondary | ICD-10-CM | POA: Diagnosis present

## 2023-11-07 DIAGNOSIS — Z79899 Other long term (current) drug therapy: Secondary | ICD-10-CM

## 2023-11-07 DIAGNOSIS — F1410 Cocaine abuse, uncomplicated: Secondary | ICD-10-CM | POA: Diagnosis present

## 2023-11-07 DIAGNOSIS — F17200 Nicotine dependence, unspecified, uncomplicated: Secondary | ICD-10-CM | POA: Diagnosis present

## 2023-11-07 DIAGNOSIS — G629 Polyneuropathy, unspecified: Secondary | ICD-10-CM | POA: Diagnosis present

## 2023-11-07 DIAGNOSIS — I10 Essential (primary) hypertension: Secondary | ICD-10-CM | POA: Diagnosis present

## 2023-11-07 DIAGNOSIS — F419 Anxiety disorder, unspecified: Secondary | ICD-10-CM | POA: Diagnosis present

## 2023-11-07 LAB — COMPREHENSIVE METABOLIC PANEL WITH GFR
ALT: 40 U/L (ref 0–44)
AST: 64 U/L — ABNORMAL HIGH (ref 15–41)
Albumin: 3.9 g/dL (ref 3.5–5.0)
Alkaline Phosphatase: 93 U/L (ref 38–126)
Anion gap: 9 (ref 5–15)
BUN: 19 mg/dL (ref 6–20)
CO2: 24 mmol/L (ref 22–32)
Calcium: 9.2 mg/dL (ref 8.9–10.3)
Chloride: 103 mmol/L (ref 98–111)
Creatinine, Ser: 0.83 mg/dL (ref 0.61–1.24)
GFR, Estimated: >60 mL/min (ref >60)
Glucose, Bld: 104 mg/dL — ABNORMAL HIGH (ref 70–99)
Potassium: 3.9 mmol/L (ref 3.5–5.1)
Sodium: 136 mmol/L (ref 135–145)
Total Bilirubin: 0.5 mg/dL (ref 0.0–1.2)
Total Protein: 7.1 g/dL (ref 6.5–8.1)

## 2023-11-07 LAB — CBC WITH DIFFERENTIAL/PLATELET
Abs Immature Granulocytes: 0.02 K/uL (ref 0.00–0.07)
Basophils Absolute: 0 K/uL (ref 0.0–0.1)
Basophils Relative: 1 %
Eosinophils Absolute: 0.2 K/uL (ref 0.0–0.5)
Eosinophils Relative: 3 %
HCT: 45.4 % (ref 39.0–52.0)
Hemoglobin: 14.9 g/dL (ref 13.0–17.0)
Immature Granulocytes: 0 %
Lymphocytes Relative: 31 %
Lymphs Abs: 2.5 K/uL (ref 0.7–4.0)
MCH: 30.5 pg (ref 26.0–34.0)
MCHC: 32.8 g/dL (ref 30.0–36.0)
MCV: 93 fL (ref 80.0–100.0)
Monocytes Absolute: 0.6 K/uL (ref 0.1–1.0)
Monocytes Relative: 8 %
Neutro Abs: 4.5 K/uL (ref 1.7–7.7)
Neutrophils Relative %: 57 %
Platelets: 203 K/uL (ref 150–400)
RBC: 4.88 MIL/uL (ref 4.22–5.81)
RDW: 12.8 % (ref 11.5–15.5)
WBC: 7.9 K/uL (ref 4.0–10.5)
nRBC: 0 % (ref 0.0–0.2)

## 2023-11-07 LAB — URINE DRUG SCREEN
Amphetamines: NEGATIVE
Barbiturates: NEGATIVE
Benzodiazepines: NEGATIVE
Cocaine: POSITIVE — AB
Fentanyl: NEGATIVE
Methadone Scn, Ur: NEGATIVE
Opiates: NEGATIVE
Tetrahydrocannabinol: NEGATIVE

## 2023-11-07 LAB — ETHANOL: Alcohol, Ethyl (B): <15 mg/dL (ref <15)

## 2023-11-07 LAB — MAGNESIUM: Magnesium: 2.3 mg/dL (ref 1.7–2.4)

## 2023-11-07 LAB — CK
Total CK: 1982 U/L — ABNORMAL HIGH (ref 49–397)
Total CK: 1941 U/L — ABNORMAL HIGH (ref 49–397)

## 2023-11-07 LAB — PHOSPHORUS: Phosphorus: 2.9 mg/dL (ref 2.5–4.6)

## 2023-11-07 MED ORDER — BICTEGRAVIR-EMTRICITAB-TENOFOV 50-200-25 MG PO TABS
1.0000 | ORAL_TABLET | Freq: Every day | ORAL | Status: DC
  Administered 2023-11-08 – 2023-11-10 (×3): 1 via ORAL
  Filled 2023-11-07 (×3): qty 1

## 2023-11-07 MED ORDER — HYDROXYZINE HCL 25 MG PO TABS: 25.0000 mg | ORAL_TABLET | Freq: Every day | ORAL | Status: DC | PRN

## 2023-11-07 MED ORDER — SODIUM CHLORIDE 0.9 % IV SOLN: INTRAVENOUS | Status: AC

## 2023-11-07 MED ORDER — SODIUM CHLORIDE 0.9 % IV BOLUS
1000.0000 mL | Freq: Once | INTRAVENOUS | Status: AC
  Administered 2023-11-07: 1000 mL via INTRAVENOUS

## 2023-11-07 MED ORDER — ONDANSETRON HCL 4 MG/2ML IJ SOLN
4.0000 mg | Freq: Four times a day (QID) | INTRAMUSCULAR | Status: DC | PRN
  Administered 2023-11-08 – 2023-11-09 (×3): 4 mg via INTRAVENOUS
  Filled 2023-11-07 (×3): qty 2

## 2023-11-07 MED ORDER — NICOTINE 21 MG/24HR TD PT24: 21.0000 mg | MEDICATED_PATCH | Freq: Every day | TRANSDERMAL | Status: DC | PRN

## 2023-11-07 MED ORDER — ACETAMINOPHEN 650 MG RE SUPP: 650.0000 mg | Freq: Four times a day (QID) | RECTAL | Status: DC | PRN

## 2023-11-07 MED ORDER — HYDRALAZINE HCL 25 MG PO TABS: 25.0000 mg | ORAL_TABLET | Freq: Four times a day (QID) | ORAL | Status: DC | PRN

## 2023-11-07 MED ORDER — HYDROXYZINE PAMOATE 25 MG PO CAPS: 25.0000 mg | ORAL_CAPSULE | Freq: Every day | ORAL | Status: DC | PRN

## 2023-11-07 MED ORDER — ACETAMINOPHEN 325 MG PO TABS: 650.0000 mg | ORAL_TABLET | Freq: Four times a day (QID) | ORAL | Status: DC | PRN

## 2023-11-07 MED ORDER — ENOXAPARIN SODIUM 40 MG/0.4ML IJ SOSY
40.0000 mg | PREFILLED_SYRINGE | INTRAMUSCULAR | Status: DC
  Administered 2023-11-07: 40 mg via SUBCUTANEOUS
  Filled 2023-11-07 (×3): qty 0.4

## 2023-11-07 MED ORDER — BICTEGRAVIR-EMTRICITAB-TENOFOV 50-200-25 MG PO TABS: 1.0000 | ORAL_TABLET | Freq: Every day | ORAL | Status: DC

## 2023-11-07 MED ORDER — ONDANSETRON HCL 4 MG PO TABS: 4.0000 mg | ORAL_TABLET | Freq: Four times a day (QID) | ORAL | Status: DC | PRN

## 2023-11-07 MED ORDER — TRAZODONE HCL 50 MG PO TABS: 150.0000 mg | ORAL_TABLET | Freq: Every evening | ORAL | Status: DC | PRN

## 2023-11-07 NOTE — ED Notes (Signed)
 Informed patient that needed a urine sample and was only needing a few drops. Advised patient that they need the sample due to his complaint here and we may have to cath him for urine sample. Patient said oh no no no ill pee.

## 2023-11-07 NOTE — ED Provider Notes (Signed)
 [place] EMERGENCY DEPARTMENT AT [facility] Provider Note   CSN: [id number] Arrival date & time: 11/07/23  9760     Patient presents with: Hematuria   Dylan [name] is a 58 y.o. male.   The history is provided by the patient and medical records.  Hematuria   58 year old male presenting to the ED with discolored urine.  He reports this just began today.  He denies any dysuria, flank pain, abdominal pain, nausea, or vomiting.  Has been eating but not drinking as much fluids as he needs.  Does feel some cramping in his arms and legs.  He has had rhabdomyolysis in the past related to cocaine use.  Denies use since March 2025, however has had multiple drug screens since then that were positive.  Prior to Admission medications   Medication Sig Start Date End Date Taking? Authorizing Provider  bictegravir-emtricitabine -tenofovir  AF (BIKTARVY ) 50-200-25 MG TABS tablet Take 1 tablet by mouth daily. 07/23/23   Luiz Channel, MD  diclofenac  Sodium (VOLTAREN ) 1 % GEL Apply 4 g topically 4 (four) times daily. Patient taking differently: Apply 4 g topically daily as needed (pain). 06/19/23   Newlin, Enobong, MD  gabapentin  (NEURONTIN ) 300 MG capsule Take 2 capsules (600 mg total) by mouth 2 (two) times daily. 07/07/23   Newlin, Enobong, MD  hydrOXYzine  (VISTARIL ) 25 MG capsule Take 25 mg by mouth daily as needed for anxiety. 07/17/23   [provider]  lidocaine  (XYLOCAINE ) 5 % ointment Apply 1 Application topically as needed. 10/25/23   Thomas, Alicia, MD  Multiple Vitamin (MULTIVITAMIN WITH MINERALS) TABS tablet Take 1 tablet by mouth daily.    [provider]  naproxen  sodium (ALEVE ) 220 MG tablet Take 220 mg by mouth daily as needed (pain).    [provider]  QUEtiapine  Fumarate 150 MG TABS Take 150 mg by mouth at bedtime. 07/17/23   [provider]  traMADol  (ULTRAM ) 50 MG tablet Take 1-2 tablets (50-100 mg total) by mouth every 6 (six) hours as  needed. 10/25/23   Debby Hila, MD  traZODone  (DESYREL ) 150 MG tablet Take 150 mg by mouth at bedtime as needed for sleep.    [provider]    Allergies: Penicillins, Latex, and Tape    Review of Systems  Genitourinary:        Discolored urine  All other systems reviewed and are negative.   Updated Vital Signs BP 122/87 (BP Location: Right Arm)   Pulse 79   Temp 98.4 F (36.9 C) (Oral)   Resp 17   SpO2 97%   Physical Exam Vitals and nursing note reviewed.  Constitutional:      Appearance: He is well-developed.  HENT:     Head: Normocephalic and atraumatic.  Eyes:     Conjunctiva/sclera: Conjunctivae normal.     Pupils: Pupils are equal, round, and reactive to light.  Cardiovascular:     Rate and Rhythm: Normal rate and regular rhythm.     Heart sounds: Normal heart sounds.  Pulmonary:     Effort: Pulmonary effort is normal. No respiratory distress.     Breath sounds: Normal breath sounds. No rhonchi.  Abdominal:     General: Bowel sounds are normal.     Palpations: Abdomen is soft.  Musculoskeletal:        General: Normal range of motion.     Cervical back: Normal range of motion.     Comments: No significant edema or swelling of extremities  Skin:  General: Skin is warm and dry.  Neurological:     Mental Status: He is alert and oriented to person, place, and time.     (all labs ordered are listed, but only abnormal results are displayed) Labs Reviewed  CBC WITH DIFFERENTIAL/PLATELET  CBC WITH DIFFERENTIAL/PLATELET  URINALYSIS, W/ REFLEX TO CULTURE (INFECTION SUSPECTED)  URINE DRUG SCREEN  CK  COMPREHENSIVE METABOLIC PANEL WITH GFR  ETHANOL    EKG: None  Radiology: No results found.   Procedures   Medications Ordered in the ED  sodium chloride  0.9 % bolus 1,000 mL (has no administration in time range)                                    Medical Decision Making Amount and/or Complexity of Data Reviewed Labs:  ordered. ECG/medicine tests: ordered and independent interpretation performed.   58 year old male presenting to the ED with discolored urine.  Does have a history of rhabdomyolysis in the setting of cocaine use.  He denies use since March, however has had multiple positive drug screens since that time.  He is afebrile and nontoxic in appearance here.  He is hemodynamically stable.  Does endorse some cramping of the extremities but does not have any notable swelling on exam.  Labs including CK sent.  Started IVF.  Labs have been redrawn x2 due to repeated reported hemolysis in lab.  Results very delayed because of this.  6:31 AM Only CBC has resulted which is reassuring.  Remainder of labs remain delayed/in process currently.  Care will be signed out to oncoming provider.    Final diagnoses:  Dark urine    ED Discharge Orders     None          Jarold Olam HERO, PA-C 11/07/23 0631    Raford Lenis, MD 11/07/23 [phone]

## 2023-11-07 NOTE — ED Notes (Signed)
 Went in to do EKG pt basically refused and said he didn't want to do it right now because he is in too much pain!!

## 2023-11-07 NOTE — Plan of Care (Signed)

## 2023-11-07 NOTE — ED Triage Notes (Signed)
 PT BIB [facility]. Per EMS PT states he has been having coffee ground color urine. PT states he has no pain with urination. PT has a HX of cocaine use.

## 2023-11-07 NOTE — ED Provider Notes (Signed)
 Care assumed from Olam Slocumb, PA-C at shift change pending labs.  See her note for full HPI.  In short, patient presents to the ED due to dark-colored urine.  History of rhabdomyolysis secondary to drug use. Admits to leg/arm cramping.   Patient given 1L IVF prior to shift change Physical Exam  BP 129/62   Pulse 72   Temp 98.4 F (36.9 C) (Oral)   Resp 16   SpO2 97%   Physical Exam Vitals and nursing note reviewed.  Constitutional:      General: He is not in acute distress.    Appearance: He is not ill-appearing.  HENT:     Head: Normocephalic.  Eyes:     Pupils: Pupils are equal, round, and reactive to light.  Cardiovascular:     Rate and Rhythm: Normal rate and regular rhythm.     Pulses: Normal pulses.     Heart sounds: Normal heart sounds. No murmur heard.    No friction rub. No gallop.  Pulmonary:     Effort: Pulmonary effort is normal.     Breath sounds: Normal breath sounds.  Abdominal:     General: Abdomen is flat. There is no distension.     Palpations: Abdomen is soft.     Tenderness: There is no abdominal tenderness. There is no guarding or rebound.  Musculoskeletal:        General: Normal range of motion.     Cervical back: Neck supple.  Skin:    General: Skin is warm and dry.  Neurological:     General: No focal deficit present.     Mental Status: He is alert.  Psychiatric:        Mood and Affect: Mood normal.        Behavior: Behavior normal.     Procedures  Procedures  ED Course / MDM   Clinical Course as of 11/07/23 1058  Wed Nov 07, 2023  0704 Informed by RN that labs hemolyzed for a third time.  Repeat labs drawn. [CA]  1009 COCAINE(!): POSITIVE [CA]    Clinical Course User Index [CA] Lorelle Aleck BROCKS, PA-C   Medical Decision Making Amount and/or Complexity of Data Reviewed Labs: ordered. Decision-making details documented in ED Course.  Risk Decision regarding hospitalization.   Care assumed from Olam Slocumb, PA-C at shift  change pending labs.  See her note for full MDM.  58 year old male presents to the ED due to dark-colored urine.  Previous history of rhabdomyolysis secondary to drug use.  Admits to some cramping.  Upon arrival, vitals all within normal limits.  CBC unremarkable.  No leukocytosis.  Normal hemoglobin.  CK with no improvement after 2 L IV fluids.  Patient will require admission for IV hydration.  10:58 AM Discussed with Dr. Celinda with [facility] who agrees to admit patient. EKG ordered    Lorelle Aleck BROCKS DEVONNA 11/07/23 1059    Rogelia Jerilynn RAMAN, MD 11/08/23 [phone]

## 2023-11-07 NOTE — H&P (Signed)
 History and Physical    Patient: Dylan [name] FMW:[id number] DOB: 29-Apr-1965 DOA: 11/07/2023 DOS: the patient was seen and examined on 11/07/2023 PCP: Delbert Clam, MD  Patient coming from: Home  Chief Complaint:  Chief Complaint  Patient presents with   Hematuria   HPI: Dylan [name] is a 58 y.o. male with medical history significant of history of rhabdomyolysis with AKI 2019, anxiety, depression, bipolar 1 disorder, history of intentional drug overdose, colon polyps, GERD, history of hepatitis B, history of HIV infection, hyperlipidemia, prediabetes, peripheral neuropathy, left knee osteoarthritis who presented to the emergency department complaints of having coffee-ground colored urine, but denied dysuria, frequency, gross hematuria or flank pain. He denied fever, chills, rhinorrhea, sore throat, wheezing or hemoptysis.  No chest pain, palpitations, diaphoresis, PND, orthopnea or pitting edema of the lower extremities.  No abdominal pain, nausea, emesis, diarrhea, constipation, melena or hematochezia. No polyuria, polydipsia, polyphagia or blurred vision.   ED course: Initial vital signs were temperature 98.4 F, pulse 79, respirations 17, BP 122/87 mmHg and O2 sat 97% on room air.  The patient received 2000 mL of normal saline bolus.  Lab work: CBC was normal.  UDS was positive for cocaine total CK was 1982 and then 1941 units/L.  CMP showed a glucose of 104 mg/dL and AST of 64 units/L, the rest of the CMP measurements were normal.   Review of Systems: As mentioned in the history of present illness. All other systems reviewed and are negative. Past Medical History:  Diagnosis Date   Acute kidney failure    2019 from Rhabdomylosis   Anxiety    Arthritis    Bipolar 1 disorder (HCC)    Colon polyps    Depression    GERD (gastroesophageal reflux disease)    Hepatitis B    Human immunodeficiency virus (HIV) (HCC)    Hyperlipidemia    Insomnia due to other mental disorder  02/03/2020   Intentional drug overdose (HCC) 11/26/2021   Neuromuscular disorder (HCC)    neuropathy   Neuropathy    Pre-diabetes    Rhabdomyolysis 02/19/2017   Unilateral primary osteoarthritis, left knee 12/14/2021   Past Surgical History:  Procedure Laterality Date   ANAL FISTULOTOMY  07/27/2023   Procedure: ANAL FISTULOTOMY;  Surgeon: Debby Hila, MD;  Location: [facility];  Service: General;;   COLONOSCOPY     FOOT ARTHRODESIS Right 09/17/2019   Procedure: FUSION RIGHT LISFRANC JOINT;  Surgeon: Harden Jerona GAILS, MD;  Location: [facility];  Service: Orthopedics;  Laterality: Right;   FOOT ARTHRODESIS Right 09/2019   HEMORROIDECTOMY     HIGH RESOLUTION ANOSCOPY N/A 07/27/2023   Procedure: ANOSCOPY, HIGH RESOLUTION;  Surgeon: Debby Hila, MD;  Location: [facility];  Service: General;  Laterality: N/A;   IR FLUORO GUIDE CV LINE RIGHT  02/22/2017   IR US  GUIDE VASC ACCESS RIGHT  02/22/2017   LASER ABLATION CONDOLAMATA N/A 10/25/2023   Procedure: ABLATION, CONDYLOMA, USING LASER;  Surgeon: Debby Hila, MD;  Location: [facility];  Service: General;  Laterality: N/A;  LASER ABLATION AND BIOPSY AIN   RECTAL BIOPSY N/A 07/27/2023   Procedure: BIOPSY AND ABLATION, RECTUM;  Surgeon: Debby Hila, MD;  Location: [facility];  Service: General;  Laterality: N/A;   TOTAL KNEE ARTHROPLASTY Left 12/14/2021   Procedure: LEFT TOTAL KNEE ARTHROPLASTY;  Surgeon: Harden Jerona GAILS, MD;  Location: [facility];  Service: Orthopedics;  Laterality: Left;   TUMOR REMOVAL     From Chest  Social History:  reports that he has been smoking e-cigarettes and cigarettes. He has a 12.5 pack-year smoking history. He has never used smokeless tobacco. He reports current alcohol use. He reports that he does not currently use drugs after having used the following drugs: Cocaine and Marijuana.  Allergies  Allergen Reactions   Penicillins Other (See Comments)    Reaction  type/severity unknown, childhood allergy; pt cannot recall all symptoms   Latex Rash   Tape Rash    Family History  Problem Relation Age of Onset   CAD Mother    Diabetes Father    Stroke Father    Colon cancer Maternal Aunt 60   Diabetes Maternal Aunt    Heart disease Maternal Uncle    Heart attack Maternal Uncle    Esophageal cancer Neg Hx    Rectal cancer Neg Hx    Stomach cancer Neg Hx     Prior to Admission medications   Medication Sig Start Date End Date Taking? Authorizing Provider  bictegravir-emtricitabine -tenofovir  AF (BIKTARVY ) 50-200-25 MG TABS tablet Take 1 tablet by mouth daily. 07/23/23   Luiz Channel, MD  diclofenac  Sodium (VOLTAREN ) 1 % GEL Apply 4 g topically 4 (four) times daily. Patient taking differently: Apply 4 g topically daily as needed (pain). 06/19/23   Newlin, Enobong, MD  gabapentin  (NEURONTIN ) 300 MG capsule Take 2 capsules (600 mg total) by mouth 2 (two) times daily. 07/07/23   Newlin, Enobong, MD  hydrOXYzine  (VISTARIL ) 25 MG capsule Take 25 mg by mouth daily as needed for anxiety. 07/17/23   [provider]  lidocaine  (XYLOCAINE ) 5 % ointment Apply 1 Application topically as needed. 10/25/23   Thomas, Alicia, MD  Multiple Vitamin (MULTIVITAMIN WITH MINERALS) TABS tablet Take 1 tablet by mouth daily.    [provider]  naproxen  sodium (ALEVE ) 220 MG tablet Take 220 mg by mouth daily as needed (pain).    [provider]  QUEtiapine  Fumarate 150 MG TABS Take 150 mg by mouth at bedtime. 07/17/23   [provider]  traMADol  (ULTRAM ) 50 MG tablet Take 1-2 tablets (50-100 mg total) by mouth every 6 (six) hours as needed. 10/25/23   Debby Hila, MD  traZODone  (DESYREL ) 150 MG tablet Take 150 mg by mouth at bedtime as needed for sleep.    [provider]    Physical Exam: Vitals:   11/07/23 0246 11/07/23 0415 11/07/23 0700 11/07/23 0821  BP: 122/87 129/62  (!) 140/80  Pulse: 79 72 75 64  Resp: 17 16 16 16    Temp: 98.4 F (36.9 C)  98.3 F (36.8 C) 98.5 F (36.9 C)  TempSrc: Oral  Oral Oral  SpO2: 97% 97% 97% 94%   Physical Exam Vitals and nursing note reviewed.  Constitutional:      General: He is awake. He is not in acute distress.    Appearance: Normal appearance. He is ill-appearing.  HENT:     Head: Normocephalic.     Nose: No rhinorrhea.  Eyes:     General: No scleral icterus.    Pupils: Pupils are equal, round, and reactive to light.  Neck:     Vascular: No JVD.  Cardiovascular:     Rate and Rhythm: Normal rate and regular rhythm.     Heart sounds: S1 normal and S2 normal.  Pulmonary:     Effort: Pulmonary effort is normal.     Breath sounds: Normal breath sounds. No wheezing, rhonchi or rales.  Abdominal:     General:  Bowel sounds are normal. There is no distension.     Palpations: Abdomen is soft.     Tenderness: There is no abdominal tenderness. There is no right CVA tenderness or left CVA tenderness.  Musculoskeletal:     Cervical back: Neck supple.     Right lower leg: No edema.     Left lower leg: No edema.  Skin:    General: Skin is warm and dry.  Neurological:     General: No focal deficit present.     Mental Status: He is alert and oriented to person, place, and time.  Psychiatric:        Mood and Affect: Mood normal.        Behavior: Behavior normal. Behavior is cooperative.     Data Reviewed:  Results are pending, will review when available.  Assessment and Plan: Principal Problem:   Rhabdomyolysis No myalgias or arthralgias. Observation/telemetry. Continue IV fluids. Monitor intake and output. Analgesics as needed. Muscle relaxants as needed. Follow CBC, CMP in AM.  Active Problems:   Cocaine abuse (HCC) Cessation advised. This may be the trigger of rhabdomyolysis.    Bipolar disorder (HCC) Noncompliant with therapy. Will order his PRN trazodone  and hydroxyzine . Follow-up with behavioral health as an outpatient.    Essential  hypertension As needed antihypertensive. Follow-up with primary care provider.    Prediabetes Monitor fasting glucose.    HIV disease (HCC) Continue antiretroviral therapy.   Follow-up with ID as an outpatient.    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 Follow-up with ID or GI as an outpatient. Monitor hepatic function periodically.    Tobacco use disorder Tobacco cessation advised. Nicotine  replacement therapy ordered.    Advance Care Planning:   Code Status: Full Code   Consults:   Family Communication:   Severity of Illness: The appropriate patient status for this patient is OBSERVATION. Observation status is judged to be reasonable and necessary in order to provide the required intensity of service to ensure the patient's safety. The patient's presenting symptoms, physical exam findings, and initial radiographic and laboratory data in the context of their medical condition is felt to place them at decreased risk for further clinical deterioration. Furthermore, it is anticipated that the patient will be medically stable for discharge from the hospital within 2 midnights of admission.   Author: Alm Dorn Castor, MD 11/07/2023 10:49 AM  For on call review [email or web address].   This document was prepared using Dragon voice recognition software and may contain some unintended transcription errors.

## 2023-11-07 NOTE — ED Notes (Signed)
 Tried to get urine sample patient stated unable to at this time. Patient was talking in his sleep.

## 2023-11-08 DIAGNOSIS — E785 Hyperlipidemia, unspecified: Secondary | ICD-10-CM | POA: Diagnosis present

## 2023-11-08 DIAGNOSIS — F319 Bipolar disorder, unspecified: Secondary | ICD-10-CM | POA: Diagnosis present

## 2023-11-08 DIAGNOSIS — R001 Bradycardia, unspecified: Secondary | ICD-10-CM | POA: Diagnosis not present

## 2023-11-08 DIAGNOSIS — Z79899 Other long term (current) drug therapy: Secondary | ICD-10-CM | POA: Diagnosis not present

## 2023-11-08 DIAGNOSIS — B181 Chronic viral hepatitis B without delta-agent: Secondary | ICD-10-CM | POA: Diagnosis present

## 2023-11-08 DIAGNOSIS — R7303 Prediabetes: Secondary | ICD-10-CM | POA: Diagnosis present

## 2023-11-08 DIAGNOSIS — F17290 Nicotine dependence, other tobacco product, uncomplicated: Secondary | ICD-10-CM | POA: Diagnosis present

## 2023-11-08 DIAGNOSIS — F17210 Nicotine dependence, cigarettes, uncomplicated: Secondary | ICD-10-CM | POA: Diagnosis present

## 2023-11-08 DIAGNOSIS — I10 Essential (primary) hypertension: Secondary | ICD-10-CM | POA: Diagnosis present

## 2023-11-08 DIAGNOSIS — R7989 Other specified abnormal findings of blood chemistry: Secondary | ICD-10-CM | POA: Diagnosis present

## 2023-11-08 DIAGNOSIS — M6282 Rhabdomyolysis: Secondary | ICD-10-CM | POA: Diagnosis present

## 2023-11-08 DIAGNOSIS — Z880 Allergy status to penicillin: Secondary | ICD-10-CM | POA: Diagnosis not present

## 2023-11-08 DIAGNOSIS — Z96652 Presence of left artificial knee joint: Secondary | ICD-10-CM | POA: Diagnosis present

## 2023-11-08 DIAGNOSIS — G629 Polyneuropathy, unspecified: Secondary | ICD-10-CM | POA: Diagnosis present

## 2023-11-08 DIAGNOSIS — Z823 Family history of stroke: Secondary | ICD-10-CM | POA: Diagnosis not present

## 2023-11-08 DIAGNOSIS — F419 Anxiety disorder, unspecified: Secondary | ICD-10-CM | POA: Diagnosis present

## 2023-11-08 DIAGNOSIS — Z981 Arthrodesis status: Secondary | ICD-10-CM | POA: Diagnosis not present

## 2023-11-08 DIAGNOSIS — F1410 Cocaine abuse, uncomplicated: Secondary | ICD-10-CM | POA: Diagnosis present

## 2023-11-08 DIAGNOSIS — Z8249 Family history of ischemic heart disease and other diseases of the circulatory system: Secondary | ICD-10-CM | POA: Diagnosis not present

## 2023-11-08 DIAGNOSIS — K219 Gastro-esophageal reflux disease without esophagitis: Secondary | ICD-10-CM | POA: Diagnosis present

## 2023-11-08 DIAGNOSIS — Z91040 Latex allergy status: Secondary | ICD-10-CM | POA: Diagnosis not present

## 2023-11-08 DIAGNOSIS — B20 Human immunodeficiency virus [HIV] disease: Secondary | ICD-10-CM | POA: Diagnosis present

## 2023-11-08 DIAGNOSIS — Z833 Family history of diabetes mellitus: Secondary | ICD-10-CM | POA: Diagnosis not present

## 2023-11-08 DIAGNOSIS — Z91199 Patient's noncompliance with other medical treatment and regimen due to unspecified reason: Secondary | ICD-10-CM | POA: Diagnosis not present

## 2023-11-08 LAB — COMPREHENSIVE METABOLIC PANEL WITH GFR
ALT: 39 U/L (ref 0–44)
AST: 45 U/L — ABNORMAL HIGH (ref 15–41)
Albumin: 3.6 g/dL (ref 3.5–5.0)
Alkaline Phosphatase: 76 U/L (ref 38–126)
Anion gap: 7 (ref 5–15)
BUN: 16 mg/dL (ref 6–20)
CO2: 25 mmol/L (ref 22–32)
Calcium: 8.5 mg/dL — ABNORMAL LOW (ref 8.9–10.3)
Chloride: 108 mmol/L (ref 98–111)
Creatinine, Ser: 0.84 mg/dL (ref 0.61–1.24)
GFR, Estimated: >60 mL/min (ref >60)
Glucose, Bld: 106 mg/dL — ABNORMAL HIGH (ref 70–99)
Potassium: 4.1 mmol/L (ref 3.5–5.1)
Sodium: 140 mmol/L (ref 135–145)
Total Bilirubin: 0.3 mg/dL (ref 0.0–1.2)
Total Protein: 5.8 g/dL — ABNORMAL LOW (ref 6.5–8.1)

## 2023-11-08 LAB — CBC
HCT: 38.4 % — ABNORMAL LOW (ref 39.0–52.0)
Hemoglobin: 12.4 g/dL — ABNORMAL LOW (ref 13.0–17.0)
MCH: 30.8 pg (ref 26.0–34.0)
MCHC: 32.3 g/dL (ref 30.0–36.0)
MCV: 95.5 fL (ref 80.0–100.0)
Platelets: 163 K/uL (ref 150–400)
RBC: 4.02 MIL/uL — ABNORMAL LOW (ref 4.22–5.81)
RDW: 13 % (ref 11.5–15.5)
WBC: 4.1 K/uL (ref 4.0–10.5)
nRBC: 0 % (ref 0.0–0.2)

## 2023-11-08 LAB — CK: Total CK: 1034 U/L — ABNORMAL HIGH (ref 49–397)

## 2023-11-08 MED ORDER — LOPERAMIDE HCL 2 MG PO CAPS
2.0000 mg | ORAL_CAPSULE | Freq: Four times a day (QID) | ORAL | Status: DC | PRN
  Administered 2023-11-08 – 2023-11-09 (×3): 2 mg via ORAL
  Filled 2023-11-08 (×3): qty 1

## 2023-11-08 MED ORDER — SODIUM CHLORIDE 0.9 % IV SOLN: INTRAVENOUS | Status: DC

## 2023-11-08 NOTE — Plan of Care (Signed)

## 2023-11-08 NOTE — Progress Notes (Signed)
 PROGRESS NOTE  Dylan [name]  FMW:[id number] DOB: 1965-04-30 DOA: 11/07/2023 PCP: Delbert Clam, MD   Brief Narrative: Patient is a 58 year old male with history of recurrent rhabdomyolysis, anxiety, depression, bipolar 1 disorder, current cocaine use, HIV, hepatitis B, peripheral neuropathy who presented with coffee-ground colored urine, cramps in the lower extremities.  On presentation, he was hemodynamically stable.  Lab work showed CK of 1982.  Patient admitted for the management of rhabdomyolysis.  Developed diarrhea today.  CK improving with IV fluid.  Assessment & Plan:  Principal Problem:   Rhabdomyolysis Active Problems:   Essential hypertension   HIV disease (HCC)   Prediabetes   Bipolar disorder (HCC)   Chronic 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 coma (HCC)   Elevated LFTs   Tobacco use disorder   Cocaine abuse (HCC)   Rhabdomyolysis: Has history of rhabdomyolysis in the past as well.  Likely from cocaine intake.  CK level improving.  Continue gentle IV fluids for today  Diarrhea: Abdomen is benign on examination.  No fever or tenderness.  Check GI pathogen panel.  Continue gentle IV fluids.  Continue Imodium   Cocaine abuse: Snorts cocaine.  Counseled for cessation.  History of HIV: Follows with ID as an outpatient  Elevated LFTs/history of chronic viral hepatitis B: Follow-up with GI/ID as an outpatient          DVT prophylaxis:enoxaparin  (LOVENOX ) injection 40 mg Start: 11/07/23 2200     Code Status: Full Code  Family Communication:None at bedside   Patient status:Inpatient  Patient is from :home  Anticipated discharge un:ynfz  Estimated DC date:tomorrow   Consultants: None  Procedures:None  Antimicrobials:  Anti-infectives (From admission, onward)    Start     Dose/Rate Route Frequency Ordered Stop   11/08/23 1000  bictegravir-emtricitabine -tenofovir  AF (BIKTARVY ) 50-200-25 MG per tablet 1 tablet        1 tablet Oral Daily  11/07/23 2201     11/07/23 1530  bictegravir-emtricitabine -tenofovir  AF (BIKTARVY ) 50-200-25 MG per tablet 1 tablet  Status:  Discontinued        1 tablet Oral Daily 11/07/23 1438 11/07/23 2201       Subjective: Patient seen and examined at bedside today.  Hemodynamically stable.  Has been having diarrhea now.  No abdominal pain.  Has 4 loose stools already this morning.  CK level improving.  Objective: Vitals:   11/07/23 1139 11/07/23 1414 11/07/23 1431 11/07/23 1822  BP:  122/71  131/80  Pulse:  60  66  Resp:  15  16  Temp:  98.5 F (36.9 C)  98.8 F (37.1 C)  TempSrc:  Oral  Oral  SpO2: 100% 100%  97%  Weight:   102 kg   Height:   6' 2 (1.88 m)     Intake/Output Summary (Last 24 hours) at 11/08/2023 1113 Last data filed at 11/08/2023 1013 Gross per 24 hour  Intake 2428 ml  Output 425 ml  Net 2003 ml   Filed Weights   11/07/23 1431  Weight: 102 kg    Examination:  General exam: Overall comfortable, not in distress HEENT: PERRL Respiratory system:  no wheezes or crackles  Cardiovascular system: S1 & S2 heard, RRR.  Gastrointestinal system: Abdomen is nondistended, soft and nontender. Central nervous system: Alert and oriented Extremities: No edema, no clubbing ,no cyanosis Skin: No rashes, no ulcers,no icterus     Data Reviewed: I have personally reviewed following labs and imaging studies  CBC: Recent Labs  Lab 11/07/23 0506 11/08/23  0550  WBC 7.9 4.1  NEUTROABS 4.5  --   HGB 14.9 12.4*  HCT 45.4 38.4*  MCV 93.0 95.5  PLT 203 163   Basic Metabolic Panel: Recent Labs  Lab 11/07/23 0734 11/07/23 0930 11/08/23 0550  NA 136  --  140  K 3.9  --  4.1  CL 103  --  108  CO2 24  --  25  GLUCOSE 104*  --  106*  BUN 19  --  16  CREATININE 0.83  --  0.84  CALCIUM  9.2  --  8.5*  MG  --  2.3  --   PHOS  --  2.9  --      No results found for this or any previous visit (from the past 240 hours).   Radiology Studies: No results  found.  Scheduled Meds:  bictegravir-emtricitabine -tenofovir  AF  1 tablet Oral Daily   enoxaparin  (LOVENOX ) injection  40 mg Subcutaneous Q24H   Continuous Infusions:  sodium chloride  75 mL/hr at 11/08/23 1034     LOS: 0 days   Ivonne Mustache, MD [facility] P10/30/2025, 11:13 AM

## 2023-11-08 NOTE — Progress Notes (Signed)
   11/08/23 0835  TOC Brief Assessment  Insurance and Status Reviewed  Patient has primary care physician Yes  Home environment has been reviewed single family home  Prior level of function: independent  Prior/Current Home Services No current home services  Social Drivers of Health Review SDOH reviewed no interventions necessary  Readmission risk has been reviewed Yes  Transition of care needs transition of care needs identified, TOC will continue to follow    Signed: Heather Saltness, MSW, LCSW Clinical Social Worker Inpatient Care Management 11/08/2023 8:36 AM

## 2023-11-09 DIAGNOSIS — M6282 Rhabdomyolysis: Secondary | ICD-10-CM | POA: Diagnosis not present

## 2023-11-09 LAB — GASTROINTESTINAL PANEL BY PCR, STOOL (REPLACES STOOL CULTURE)

## 2023-11-09 LAB — BASIC METABOLIC PANEL WITH GFR
Anion gap: 10 (ref 5–15)
BUN: 13 mg/dL (ref 6–20)
CO2: 23 mmol/L (ref 22–32)
Calcium: 9 mg/dL (ref 8.9–10.3)
Chloride: 104 mmol/L (ref 98–111)
Creatinine, Ser: 1.02 mg/dL (ref 0.61–1.24)
GFR, Estimated: >60 mL/min (ref >60)
Glucose, Bld: 97 mg/dL (ref 70–99)
Potassium: 3.7 mmol/L (ref 3.5–5.1)
Sodium: 137 mmol/L (ref 135–145)

## 2023-11-09 LAB — MAGNESIUM: Magnesium: 2 mg/dL (ref 1.7–2.4)

## 2023-11-09 LAB — GLUCOSE, CAPILLARY: Glucose-Capillary: 98 mg/dL (ref 70–99)

## 2023-11-09 LAB — CK: Total CK: 535 U/L — ABNORMAL HIGH (ref 49–397)

## 2023-11-09 MED ORDER — BISMUTH SUBSALICYLATE 262 MG/15ML PO SUSP
30.0000 mL | Freq: Once | ORAL | Status: AC
  Administered 2023-11-09: 30 mL via ORAL
  Filled 2023-11-09: qty 236

## 2023-11-09 NOTE — Plan of Care (Signed)

## 2023-11-09 NOTE — Progress Notes (Signed)
 PROGRESS NOTE  Dylan [name]  FMW:[id number] DOB: 1965-06-18 DOA: 11/07/2023 PCP: Delbert Clam, MD   Brief Narrative: Patient is a 58 year old male with history of recurrent rhabdomyolysis, anxiety, depression, bipolar 1 disorder, current cocaine use, HIV, hepatitis B, peripheral neuropathy who presented with coffee-ground colored urine, cramps in the lower extremities.  On presentation, he was hemodynamically stable.  Lab work showed CK of 1982.  Patient admitted for the management of rhabdomyolysis.   CK improving with IV fluid.  Hospital course remarkable for new onset diarrhea.  GI pathogen panel pending.  Assessment & Plan:  Principal Problem:   Rhabdomyolysis Active Problems:   Essential hypertension   HIV disease (HCC)   Prediabetes   Bipolar disorder (HCC)   Chronic 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 coma (HCC)   Elevated LFTs   Tobacco use disorder   Cocaine abuse (HCC)   Rhabdomyolysis: Has history of rhabdomyolysis in the past as well.  Likely from cocaine intake.  CK level improving.  Continue gentle IV fluids for today  Diarrhea: Abdomen is benign on examination.  No fever or tenderness.  Check GI pathogen panel.  Continue gentle IV fluids.  Continue Imodium   Cocaine abuse: Snorts cocaine.  Counseled for cessation.  History of HIV: Follows with ID as an outpatient  Elevated LFTs/history of chronic viral hepatitis B: Follow-up with GI/ID as an outpatient          DVT prophylaxis:enoxaparin  (LOVENOX ) injection 40 mg Start: 11/07/23 2200     Code Status: Full Code  Family Communication:None at bedside   Patient status:Inpatient  Patient is from :home  Anticipated discharge un:ynfz  Estimated DC date:tomorrow/after diarrhoea improves   Consultants: None  Procedures:None  Antimicrobials:  Anti-infectives (From admission, onward)    Start     Dose/Rate Route Frequency Ordered Stop   11/08/23 1000   bictegravir-emtricitabine -tenofovir  AF (BIKTARVY ) 50-200-25 MG per tablet 1 tablet        1 tablet Oral Daily 11/07/23 2201     11/07/23 1530  bictegravir-emtricitabine -tenofovir  AF (BIKTARVY ) 50-200-25 MG per tablet 1 tablet  Status:  Discontinued        1 tablet Oral Daily 11/07/23 1438 11/07/23 2201       Subjective: Patient seen and examined at bedside today.  Hemodynamically stable.  CK level has improved.  He is having severe diarrhea.  He had diarrhea overnight.  We discussed about continuing fluid and following a GI pathogen panel.  Denies abdomen pain, nausea vomiting.  Tolerating diet.  Abdomen benign on examination  Objective: Vitals:   11/07/23 1139 11/07/23 1414 11/07/23 1431 11/07/23 1822  BP:  122/71  131/80  Pulse:  60  66  Resp:  15  16  Temp:  98.5 F (36.9 C)  98.8 F (37.1 C)  TempSrc:  Oral  Oral  SpO2: 100% 100%  97%  Weight:   102 kg   Height:   6' 2 (1.88 m)    No intake or output data in the 24 hours ending 11/09/23 1033  Filed Weights   11/07/23 1431  Weight: 102 kg    Examination:  General exam: Overall comfortable, not in distress HEENT: PERRL Respiratory system:  no wheezes or crackles  Cardiovascular system: S1 & S2 heard, RRR.  Gastrointestinal system: Abdomen is nondistended, soft and nontender. Central nervous system: Alert and oriented Extremities: No edema, no clubbing ,no cyanosis Skin: No rashes, no ulcers,no icterus     Data Reviewed: I have personally reviewed following labs  and imaging studies  CBC: Recent Labs  Lab 11/07/23 0506 11/08/23 0550  WBC 7.9 4.1  NEUTROABS 4.5  --   HGB 14.9 12.4*  HCT 45.4 38.4*  MCV 93.0 95.5  PLT 203 163   Basic Metabolic Panel: Recent Labs  Lab 11/07/23 0734 11/07/23 0930 11/08/23 0550 11/09/23 0537  NA 136  --  140 137  K 3.9  --  4.1 3.7  CL 103  --  108 104  CO2 24  --  25 23  GLUCOSE 104*  --  106* 97  BUN 19  --  16 13  CREATININE 0.83  --  0.84 1.02  CALCIUM  9.2  --   8.5* 9.0  MG  --  2.3  --  2.0  PHOS  --  2.9  --   --      No results found for this or any previous visit (from the past 240 hours).   Radiology Studies: No results found.  Scheduled Meds:  bictegravir-emtricitabine -tenofovir  AF  1 tablet Oral Daily   enoxaparin  (LOVENOX ) injection  40 mg Subcutaneous Q24H   Continuous Infusions:  sodium chloride  75 mL/hr at 11/08/23 1034     LOS: 1 day   Ivonne Mustache, MD [facility] P10/31/2025, 10:33 AM

## 2023-11-09 NOTE — Progress Notes (Addendum)
     Patient Name: Dylan [name]           DOB: May 20, 1965  MRN: [id number]      Admission Date: 11/07/2023  Attending Provider: Jillian Buttery, MD  Primary Diagnosis: Rhabdomyolysis   Level of care: Telemetry   OVERNIGHT EVENT   Nursing staff reports patient became bradycardic while bearing down to have bowel movement. Slightly lightheaded when getting out of bed.  Otherwise asymptomatic.  Denies chest pain, palpitations, nausea, vomiting.  Patient is back to bed, BP stable.  HR 90s.  EKG being obtained, NSR without HB.  Patient has been refusing cardiac telemetry, but has now agreed to wear it. Continue IV fluids Labs ordered   Julee Stoll, DNP, ACNPC- AG [facility] [place]

## 2023-11-10 DIAGNOSIS — M6282 Rhabdomyolysis: Secondary | ICD-10-CM | POA: Diagnosis not present

## 2023-11-10 LAB — CBC
HCT: 42.5 % (ref 39.0–52.0)
Hemoglobin: 13.9 g/dL (ref 13.0–17.0)
MCH: 30.8 pg (ref 26.0–34.0)
MCHC: 32.7 g/dL (ref 30.0–36.0)
MCV: 94 fL (ref 80.0–100.0)
Platelets: 204 K/uL (ref 150–400)
RBC: 4.52 MIL/uL (ref 4.22–5.81)
RDW: 12.9 % (ref 11.5–15.5)
WBC: 4.8 K/uL (ref 4.0–10.5)
nRBC: 0 % (ref 0.0–0.2)

## 2023-11-10 MED ORDER — PANTOPRAZOLE SODIUM 40 MG PO TBEC: 40.0000 mg | DELAYED_RELEASE_TABLET | Freq: Every day | ORAL | 0 refills | Status: AC

## 2023-11-10 MED ORDER — NICOTINE 21 MG/24HR TD PT24: 21.0000 mg | MEDICATED_PATCH | Freq: Every day | TRANSDERMAL | 0 refills | Status: DC | PRN

## 2023-11-10 MED ORDER — PANTOPRAZOLE SODIUM 40 MG PO TBEC
40.0000 mg | DELAYED_RELEASE_TABLET | Freq: Every day | ORAL | Status: DC
  Administered 2023-11-10: 40 mg via ORAL
  Filled 2023-11-10: qty 1

## 2023-11-10 MED ORDER — LOPERAMIDE HCL 2 MG PO CAPS: 2.0000 mg | ORAL_CAPSULE | Freq: Four times a day (QID) | ORAL | 0 refills | Status: DC | PRN

## 2023-11-10 NOTE — Plan of Care (Signed)
  Problem: Education: Goal: Knowledge of General Education information will improve Description: Including pain rating scale, medication(s)/side effects and non-pharmacologic comfort measures 11/10/2023 1122 by Alaina Dozier PARAS, RN Outcome: Adequate for Discharge 11/10/2023 0800 by Alaina Dozier PARAS, RN Outcome: Progressing   Problem: Health Behavior/Discharge Planning: Goal: Ability to manage health-related needs will improve 11/10/2023 1122 by Alaina Dozier PARAS, RN Outcome: Adequate for Discharge 11/10/2023 0800 by Alaina Dozier PARAS, RN Outcome: Progressing   Problem: Clinical Measurements: Goal: Ability to maintain clinical measurements within normal limits will improve 11/10/2023 1122 by Alaina Dozier PARAS, RN Outcome: Adequate for Discharge 11/10/2023 0800 by Alaina Dozier PARAS, RN Outcome: Progressing Goal: Will remain free from infection 11/10/2023 1122 by Alaina Dozier PARAS, RN Outcome: Adequate for Discharge 11/10/2023 0800 by Alaina Dozier PARAS, RN Outcome: Progressing Goal: Diagnostic test results will improve 11/10/2023 1122 by Alaina Dozier PARAS, RN Outcome: Adequate for Discharge 11/10/2023 0800 by Alaina Dozier PARAS, RN Outcome: Progressing Goal: Respiratory complications will improve Outcome: Adequate for Discharge Goal: Cardiovascular complication will be avoided Outcome: Adequate for Discharge   Problem: Activity: Goal: Risk for activity intolerance will decrease Outcome: Adequate for Discharge   Problem: Nutrition: Goal: Adequate nutrition will be maintained Outcome: Adequate for Discharge   Problem: Coping: Goal: Level of anxiety will decrease Outcome: Adequate for Discharge   Problem: Elimination: Goal: Will not experience complications related to bowel motility Outcome: Adequate for Discharge Goal: Will not experience complications related to urinary retention Outcome: Adequate for Discharge   Problem: Pain  Managment: Goal: General experience of comfort will improve and/or be controlled Outcome: Adequate for Discharge   Problem: Safety: Goal: Ability to remain free from injury will improve Outcome: Adequate for Discharge   Problem: Skin Integrity: Goal: Risk for impaired skin integrity will decrease Outcome: Adequate for Discharge

## 2023-11-10 NOTE — Plan of Care (Signed)

## 2023-11-10 NOTE — Progress Notes (Signed)
 IV removed, dressed, belongings packed, left on his own before receiving discharge instructions, A&O, vitals stable.

## 2023-11-10 NOTE — Discharge Summary (Signed)
 Physician Discharge Summary  Dylan [name] FMW:[id number] DOB: 01-Jan-1966 DOA: 11/07/2023  PCP: Delbert Clam, MD  Admit date: 11/07/2023 Discharge date: 11/10/2023  Admitted From: Home Disposition:  Home  Discharge Condition:Stable CODE STATUS:FULL Diet recommendation:  Regular   Brief/Interim Summary: Patient is a 58 year old male with history of recurrent rhabdomyolysis, anxiety, depression, bipolar 1 disorder, current cocaine use, HIV, hepatitis B, peripheral neuropathy who presented with coffee-ground colored urine, cramps in the lower extremities.  On presentation, he was hemodynamically stable.  Lab work showed CK of 1982.  Patient admitted for the management of rhabdomyolysis.   CK improved with IV fluid.  Hospital course remarkable for new onset diarrhea.  GI pathogen panel showed Sapo virus.  Diarrhea has improved today.  Following problems were addressed during the hospitalization:  Rhabdomyolysis: Has history of rhabdomyolysis in the past as well.  Likely from cocaine intake.  CK level improved   Diarrhea: Abdomen is benign on examination.  No fever or tenderness.  GI pathogen panel showed Sapo virus.  Diarrhea has improved today.    Cocaine abuse: Snorts cocaine.  Counseled for cessation.   History of HIV: Follows with ID as an outpatient   Elevated LFTs/history of chronic viral hepatitis B: Follow-up with GI/ID as an outpatient        Discharge Diagnoses:  Principal Problem:   Rhabdomyolysis Active Problems:   Essential hypertension   HIV disease (HCC)   Prediabetes   Bipolar disorder (HCC)   Chronic 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 coma (HCC)   Elevated LFTs   Tobacco use disorder   Cocaine abuse [facility])    Discharge Instructions  Discharge Instructions     Diet - low sodium heart healthy   Complete by: As directed    Discharge instructions   Complete by: As directed    1)Please take your medications as instructed   Increase  activity slowly   Complete by: As directed       Allergies as of 11/10/2023       Reactions   Penicillins Other (See Comments)   Reaction type/severity unknown, childhood allergy; pt cannot recall all symptoms   Latex Rash   Tape Rash        Medication List     TAKE these medications    bictegravir-emtricitabine -tenofovir  AF 50-200-25 MG Tabs tablet Commonly known as: BIKTARVY  Take 1 tablet by mouth daily.   diclofenac  Sodium 1 % Gel Commonly known as: Voltaren  Apply 4 g topically 4 (four) times daily. What changed:  when to take this reasons to take this   gabapentin  300 MG capsule Commonly known as: NEURONTIN  Take 2 capsules (600 mg total) by mouth 2 (two) times daily.   hydrOXYzine  25 MG capsule Commonly known as: VISTARIL  Take 25 mg by mouth daily as needed for anxiety.   lidocaine  5 % ointment Commonly known as: XYLOCAINE  Apply 1 Application topically as needed.   loperamide  2 MG capsule Commonly known as: IMODIUM  Take 1 capsule (2 mg total) by mouth every 6 (six) hours as needed for diarrhea or loose stools.   multivitamin with minerals Tabs tablet Take 1 tablet by mouth daily.   naproxen  sodium 220 MG tablet Commonly known as: ALEVE  Take 220 mg by mouth daily as needed (pain).   nicotine  21 mg/24hr patch Commonly known as: NICODERM CQ  - dosed in mg/24 hours Place 1 patch (21 mg total) onto the skin daily as needed (Nicotine  withdrawal symptoms.).   pantoprazole  40 MG tablet Commonly known  as: PROTONIX  Take 1 tablet (40 mg total) by mouth daily. Start taking on: November 11, 2023   QUEtiapine  Fumarate 150 MG Tabs Take 150 mg by mouth at bedtime.   traMADol  50 MG tablet Commonly known as: ULTRAM  Take 1-2 tablets (50-100 mg total) by mouth every 6 (six) hours as needed. What changed: reasons to take this   traZODone  150 MG tablet Commonly known as: DESYREL  Take 150 mg by mouth at bedtime as needed for sleep.        Follow-up Information      Delbert Clam, MD. Schedule an appointment as soon as possible for a visit in 1 week(s).   Specialty: Family Medicine Contact information: [address] [place] [address] [place] [address] [phone]                Allergies  Allergen Reactions   Penicillins Other (See Comments)    Reaction type/severity unknown, childhood allergy; pt cannot recall all symptoms   Latex Rash   Tape Rash    Consultations: None   Procedures/Studies: No results found.    Subjective: Patient seen and examined at bedside today.  He feels better today.  Still having some diarrhea but has significantly improved since yesterday.  Does not appear dehydrated.  Medically stable for discharge  Discharge Exam: Vitals:   11/07/23 1822 11/09/23 2025  BP: 131/80 118/76  Pulse: 66 85  Resp: 16 20  Temp:  98.3 F (36.8 C)  SpO2: 97% 100%   Vitals:   11/07/23 1414 11/07/23 1431 11/07/23 1822 11/09/23 2025  BP: 122/71  131/80 118/76  Pulse: 60  66 85  Resp: 15  16 20   Temp: 98.5 F (36.9 C)  98.8 F (37.1 C) 98.3 F (36.8 C)  TempSrc: Oral  Oral Oral  SpO2: 100%  97% 100%  Weight:  102 kg    Height:  6' 2 (1.88 m)      General: Pt is alert, awake, not in acute distress Cardiovascular: RRR, S1/S2 +, no rubs, no gallops Respiratory: CTA bilaterally, no wheezing, no rhonchi Abdominal: Soft, NT, ND, bowel sounds + Extremities: no edema, no cyanosis    The results of significant diagnostics from this hospitalization (including imaging, microbiology, ancillary and laboratory) are listed below for reference.     Microbiology: Recent Results (from the past 240 hours)  Gastrointestinal Panel by PCR , Stool     Status: Abnormal   Collection Time: 11/08/23  9:57 AM   Specimen: Stool  Result Value Ref Range Status   Campylobacter species NOT DETECTED NOT DETECTED Final   Plesimonas shigelloides NOT DETECTED NOT DETECTED Final   Salmonella species NOT DETECTED NOT DETECTED  Final   Yersinia enterocolitica NOT DETECTED NOT DETECTED Final   Vibrio species NOT DETECTED NOT DETECTED Final   Vibrio cholerae NOT DETECTED NOT DETECTED Final   Enteroaggregative E coli (EAEC) NOT DETECTED NOT DETECTED Final   Enteropathogenic E coli (EPEC) NOT DETECTED NOT DETECTED Final   Enterotoxigenic E coli (ETEC) NOT DETECTED NOT DETECTED Final   Shiga like toxin producing E coli (STEC) NOT DETECTED NOT DETECTED Final   Shigella/Enteroinvasive E coli (EIEC) NOT DETECTED NOT DETECTED Final   Cryptosporidium NOT DETECTED NOT DETECTED Final   Cyclospora cayetanensis NOT DETECTED NOT DETECTED Final   Entamoeba histolytica NOT DETECTED NOT DETECTED Final   Giardia lamblia NOT DETECTED NOT DETECTED Final   Adenovirus F40/41 NOT DETECTED NOT DETECTED Final   Astrovirus NOT DETECTED NOT DETECTED Final  Norovirus GI/GII NOT DETECTED NOT DETECTED Final   Rotavirus A NOT DETECTED NOT DETECTED Final   Sapovirus (I, II, IV, and V) DETECTED (A) NOT DETECTED Final    Comment: Performed at [facility], [address] [place], [place] [address]     Labs: BNP (last 3 results) No results for input(s): BNP in the last 8760 hours. Basic Metabolic Panel: Recent Labs  Lab 11/07/23 0734 11/07/23 0930 11/08/23 0550 11/09/23 0537  NA 136  --  140 137  K 3.9  --  4.1 3.7  CL 103  --  108 104  CO2 24  --  25 23  GLUCOSE 104*  --  106* 97  BUN 19  --  16 13  CREATININE 0.83  --  0.84 1.02  CALCIUM  9.2  --  8.5* 9.0  MG  --  2.3  --  2.0  PHOS  --  2.9  --   --    Liver Function Tests: Recent Labs  Lab 11/07/23 0734 11/08/23 0550  AST 64* 45*  ALT 40 39  ALKPHOS 93 76  BILITOT 0.5 0.3  PROT 7.1 5.8*  ALBUMIN 3.9 3.6   No results for input(s): LIPASE, AMYLASE in the last 168 hours. No results for input(s): AMMONIA in the last 168 hours. CBC: Recent Labs  Lab 11/07/23 0506 11/08/23 0550 11/10/23 0948  WBC 7.9 4.1 4.8  NEUTROABS 4.5  --   --   HGB 14.9  12.4* 13.9  HCT 45.4 38.4* 42.5  MCV 93.0 95.5 94.0  PLT 203 163 204   Cardiac Enzymes: Recent Labs  Lab 11/07/23 0734 11/07/23 0930 11/08/23 0550 11/09/23 0538  CKTOTAL 1,982* 1,941* 1,034* 535*   BNP: Invalid input(s): POCBNP CBG: Recent Labs  Lab 11/09/23 0458  GLUCAP 98   D-Dimer No results for input(s): DDIMER in the last 72 hours. Hgb A1c No results for input(s): HGBA1C in the last 72 hours. Lipid Profile No results for input(s): CHOL, HDL, LDLCALC, TRIG, CHOLHDL, LDLDIRECT in the last 72 hours. Thyroid  function studies No results for input(s): TSH, T4TOTAL, T3FREE, THYROIDAB in the last 72 hours.  Invalid input(s): FREET3 Anemia work up No results for input(s): VITAMINB12, FOLATE, FERRITIN, TIBC, IRON , RETICCTPCT in the last 72 hours. Urinalysis    Component Value Date/Time   COLORURINE YELLOW 09/09/2023 2357   APPEARANCEUR CLEAR 09/09/2023 2357   LABSPEC 1.017 09/09/2023 2357   PHURINE 6.0 09/09/2023 2357   GLUCOSEU NEGATIVE 09/09/2023 2357   HGBUR NEGATIVE 09/09/2023 2357   BILIRUBINUR NEGATIVE 09/09/2023 2357   KETONESUR NEGATIVE 09/09/2023 2357   PROTEINUR NEGATIVE 09/09/2023 2357   UROBILINOGEN 0.2 03/06/2020 1149   NITRITE NEGATIVE 09/09/2023 2357   LEUKOCYTESUR NEGATIVE 09/09/2023 2357   Sepsis Labs Recent Labs  Lab 11/07/23 0506 11/08/23 0550 11/10/23 0948  WBC 7.9 4.1 4.8   Microbiology Recent Results (from the past 240 hours)  Gastrointestinal Panel by PCR , Stool     Status: Abnormal   Collection Time: 11/08/23  9:57 AM   Specimen: Stool  Result Value Ref Range Status   Campylobacter species NOT DETECTED NOT DETECTED Final   Plesimonas shigelloides NOT DETECTED NOT DETECTED Final   Salmonella species NOT DETECTED NOT DETECTED Final   Yersinia enterocolitica NOT DETECTED NOT DETECTED Final   Vibrio species NOT DETECTED NOT DETECTED Final   Vibrio cholerae NOT DETECTED NOT DETECTED Final    Enteroaggregative E coli (EAEC) NOT DETECTED NOT DETECTED Final   Enteropathogenic E coli (EPEC)  NOT DETECTED NOT DETECTED Final   Enterotoxigenic E coli (ETEC) NOT DETECTED NOT DETECTED Final   Shiga like toxin producing E coli (STEC) NOT DETECTED NOT DETECTED Final   Shigella/Enteroinvasive E coli (EIEC) NOT DETECTED NOT DETECTED Final   Cryptosporidium NOT DETECTED NOT DETECTED Final   Cyclospora cayetanensis NOT DETECTED NOT DETECTED Final   Entamoeba histolytica NOT DETECTED NOT DETECTED Final   Giardia lamblia NOT DETECTED NOT DETECTED Final   Adenovirus F40/41 NOT DETECTED NOT DETECTED Final   Astrovirus NOT DETECTED NOT DETECTED Final   Norovirus GI/GII NOT DETECTED NOT DETECTED Final   Rotavirus A NOT DETECTED NOT DETECTED Final   Sapovirus (I, II, IV, and V) DETECTED (A) NOT DETECTED Final    Comment: Performed at [facility], [address] [place], [place] [address]    Please note: You were cared for by a hospitalist during your hospital stay. Once you are discharged, your primary care physician will handle any further medical issues. Please note that NO REFILLS for any discharge medications will be authorized once you are discharged, as it is imperative that you return to your primary care physician (or establish a relationship with a primary care physician if you do not have one) for your post hospital discharge needs so that they can reassess your need for medications and monitor your lab values.    Time coordinating discharge: 40 minutes  SIGNED:   Ivonne Mustache, MD  [facility] 11/10/2023, 11:08 AM Pager [phone]  If 7PM-7AM, please contact night-coverage [email or web address] Password [email or web address]

## 2023-11-10 NOTE — Progress Notes (Signed)
 Patient refusing to wear cardiac monitor, education provided to patient on the importance of cardiac monitoring related to patient history and current diagnosis. IV fluids also declined by patient. Patient persistent on not wearing the monitor. Night time cover provider notified of events.

## 2023-11-12 ENCOUNTER — Encounter: Payer: Self-pay | Admitting: Radiology

## 2023-11-13 ENCOUNTER — Telehealth: Payer: Self-pay

## 2023-11-13 NOTE — Transitions of Care (Post Inpatient/ED Visit) (Signed)
   11/13/2023  Name: Rodricus Candelaria MRN: [id number] DOB: 09-Jul-1965  Today's TOC FU Call Status: Today's TOC FU Call Status:: Unsuccessful Call (1st Attempt) Unsuccessful Call (1st Attempt) Date: 11/13/23  Attempted to reach the patient regarding the most recent Inpatient/ED visit.  Follow Up Plan: Additional outreach attempts will be made to reach the patient to complete the Transitions of Care (Post Inpatient/ED visit) call.   Signature  Slater Diesel, RN

## 2023-11-14 ENCOUNTER — Emergency Department (HOSPITAL_COMMUNITY)
Admission: EM | Admit: 2023-11-14 | Discharge: 2023-11-14 | Disposition: A | Discharge status: Home / Self Care | Payer: MEDICAID | Attending: Emergency Medicine | Admitting: Emergency Medicine

## 2023-11-14 ENCOUNTER — Encounter (HOSPITAL_COMMUNITY): Payer: Self-pay | Admitting: Emergency Medicine

## 2023-11-14 ENCOUNTER — Other Ambulatory Visit: Payer: Self-pay

## 2023-11-14 ENCOUNTER — Telehealth: Payer: Self-pay

## 2023-11-14 DIAGNOSIS — R112 Nausea with vomiting, unspecified: Secondary | ICD-10-CM | POA: Diagnosis present

## 2023-11-14 DIAGNOSIS — R197 Diarrhea, unspecified: Secondary | ICD-10-CM | POA: Diagnosis not present

## 2023-11-14 DIAGNOSIS — R10817 Generalized abdominal tenderness: Secondary | ICD-10-CM | POA: Diagnosis not present

## 2023-11-14 DIAGNOSIS — Z91040 Latex allergy status: Secondary | ICD-10-CM | POA: Insufficient documentation

## 2023-11-14 LAB — LIPASE, BLOOD: Lipase: 34 U/L (ref 11–51)

## 2023-11-14 LAB — URINALYSIS, ROUTINE W REFLEX MICROSCOPIC
Bilirubin Urine: NEGATIVE
Glucose, UA: NEGATIVE mg/dL
Hgb urine dipstick: NEGATIVE
Ketones, ur: NEGATIVE mg/dL
Leukocytes,Ua: NEGATIVE
Nitrite: NEGATIVE
Protein, ur: NEGATIVE mg/dL
Specific Gravity, Urine: 1.011 (ref 1.005–1.030)
pH: 6 (ref 5.0–8.0)

## 2023-11-14 LAB — COMPREHENSIVE METABOLIC PANEL WITH GFR
ALT: 32 U/L (ref 0–44)
AST: 30 U/L (ref 15–41)
Albumin: 4 g/dL (ref 3.5–5.0)
Alkaline Phosphatase: 69 U/L (ref 38–126)
Anion gap: 14 (ref 5–15)
BUN: 11 mg/dL (ref 6–20)
CO2: 19 mmol/L — ABNORMAL LOW (ref 22–32)
Calcium: 9.2 mg/dL (ref 8.9–10.3)
Chloride: 102 mmol/L (ref 98–111)
Creatinine, Ser: 0.93 mg/dL (ref 0.61–1.24)
GFR, Estimated: >60 mL/min (ref >60)
Glucose, Bld: 122 mg/dL — ABNORMAL HIGH (ref 70–99)
Potassium: 3.7 mmol/L (ref 3.5–5.1)
Sodium: 135 mmol/L (ref 135–145)
Total Bilirubin: 0.5 mg/dL (ref 0.0–1.2)
Total Protein: 7.7 g/dL (ref 6.5–8.1)

## 2023-11-14 LAB — CBC
HCT: 42.9 % (ref 39.0–52.0)
Hemoglobin: 14.3 g/dL (ref 13.0–17.0)
MCH: 30.7 pg (ref 26.0–34.0)
MCHC: 33.3 g/dL (ref 30.0–36.0)
MCV: 92.1 fL (ref 80.0–100.0)
Platelets: 237 K/uL (ref 150–400)
RBC: 4.66 MIL/uL (ref 4.22–5.81)
RDW: 12.7 % (ref 11.5–15.5)
WBC: 9.5 K/uL (ref 4.0–10.5)
nRBC: 0 % (ref 0.0–0.2)

## 2023-11-14 LAB — CK: Total CK: 522 U/L — ABNORMAL HIGH (ref 49–397)

## 2023-11-14 MED ORDER — ONDANSETRON 4 MG PO TBDP: ORAL_TABLET | ORAL | 0 refills | Status: AC

## 2023-11-14 MED ORDER — DIPHENOXYLATE-ATROPINE 2.5-0.025 MG PO TABS
2.0000 | ORAL_TABLET | Freq: Once | ORAL | Status: AC
  Administered 2023-11-14: 2 via ORAL
  Filled 2023-11-14: qty 2

## 2023-11-14 MED ORDER — SODIUM CHLORIDE 0.9 % IV BOLUS
1000.0000 mL | Freq: Once | INTRAVENOUS | Status: AC
  Administered 2023-11-14: 1000 mL via INTRAVENOUS

## 2023-11-14 MED ORDER — ONDANSETRON HCL 4 MG/2ML IJ SOLN
4.0000 mg | Freq: Once | INTRAMUSCULAR | Status: AC
  Administered 2023-11-14: 4 mg via INTRAVENOUS
  Filled 2023-11-14: qty 2

## 2023-11-14 NOTE — Transitions of Care (Post Inpatient/ED Visit) (Signed)
   11/14/2023  Name: Dylan [name] MRN: [id number] DOB: July 12, 1965  Today's TOC FU Call Status: Today's TOC FU Call Status:: Unsuccessful Call (2nd Attempt) Unsuccessful Call (1st Attempt) Date: 11/13/23 Unsuccessful Call (2nd Attempt) Date: 11/14/23  Attempted to reach the patient regarding the most recent Inpatient/ED visit.  Follow Up Plan: Additional outreach attempts will be made to reach the patient to complete the Transitions of Care (Post Inpatient/ED visit) call.   Signature  Slater Diesel, RN

## 2023-11-14 NOTE — ED Triage Notes (Signed)
 Patient reports generalized abdominal pain associated with emesis and diarrhea that started tonight. Recently d/c from hospital on 11/1 for Rhabdomyolysis.

## 2023-11-14 NOTE — ED Provider Notes (Signed)
 [facility] Provider Note   CSN: [id number] Arrival date & time: 11/14/23  0430     Patient presents with: Abdominal Pain   Dylan [name] is a 58 y.o. male.   Patient presents to the emergency department for evaluation of nausea, vomiting and diarrhea.  Patient reports that he was hospitalized this past week for rhabdomyolysis.  He went home 4 days ago.  Symptoms began last night.       Prior to Admission medications   Medication Sig Start Date End Date Taking? Authorizing Provider  ondansetron  (ZOFRAN -ODT) 4 MG disintegrating tablet 4mg  ODT q4 hours prn nausea/vomit 11/14/23  Yes Dexter Signor, Lonni PARAS, MD  bictegravir-emtricitabine -tenofovir  AF (BIKTARVY ) 50-200-25 MG TABS tablet Take 1 tablet by mouth daily. 07/23/23   Luiz Channel, MD  diclofenac  Sodium (VOLTAREN ) 1 % GEL Apply 4 g topically 4 (four) times daily. Patient taking differently: Apply 4 g topically daily as needed (pain). 06/19/23   Newlin, Enobong, MD  gabapentin  (NEURONTIN ) 300 MG capsule Take 2 capsules (600 mg total) by mouth 2 (two) times daily. 07/07/23   Newlin, Enobong, MD  hydrOXYzine  (VISTARIL ) 25 MG capsule Take 25 mg by mouth daily as needed for anxiety. 07/17/23   [provider]  lidocaine  (XYLOCAINE ) 5 % ointment Apply 1 Application topically as needed. Patient not taking: Reported on 11/08/2023 10/25/23   Debby Hila, MD  loperamide  (IMODIUM ) 2 MG capsule Take 1 capsule (2 mg total) by mouth every 6 (six) hours as needed for diarrhea or loose stools. 11/10/23   Adhikari, Amrit, MD  Multiple Vitamin (MULTIVITAMIN WITH MINERALS) TABS tablet Take 1 tablet by mouth daily. Patient not taking: Reported on 11/08/2023    [provider]  naproxen  sodium (ALEVE ) 220 MG tablet Take 220 mg by mouth daily as needed (pain).    [provider]  nicotine  (NICODERM CQ  - DOSED IN MG/24 HOURS) 21 mg/24hr patch Place 1 patch (21 mg total) onto the  skin daily as needed (Nicotine  withdrawal symptoms.). 11/10/23   Adhikari, Amrit, MD  pantoprazole  (PROTONIX ) 40 MG tablet Take 1 tablet (40 mg total) by mouth daily. 11/11/23   Jillian Buttery, MD  QUEtiapine  Fumarate 150 MG TABS Take 150 mg by mouth at bedtime. 07/17/23   [provider]  traMADol  (ULTRAM ) 50 MG tablet Take 1-2 tablets (50-100 mg total) by mouth every 6 (six) hours as needed. Patient taking differently: Take 50-100 mg by mouth every 6 (six) hours as needed for moderate pain (pain score 4-6). 10/25/23   Debby Hila, MD  traZODone  (DESYREL ) 150 MG tablet Take 150 mg by mouth at bedtime as needed for sleep.    [provider]    Allergies: Penicillins, Latex, and Tape    Review of Systems  Updated Vital Signs BP (!) 142/93 (BP Location: Right Arm)   Pulse 100   Temp 97.8 F (36.6 C) (Oral)   Resp 20   Ht 6' 2 (1.88 m)   Wt 104.3 kg   SpO2 99%   BMI 29.53 kg/m   Physical Exam Vitals and nursing note reviewed.  Constitutional:      General: He is not in acute distress.    Appearance: He is well-developed.  HENT:     Head: Normocephalic and atraumatic.     Mouth/Throat:     Mouth: Mucous membranes are moist.  Eyes:     General: Vision grossly intact. Gaze aligned appropriately.     Extraocular Movements: Extraocular movements  intact.     Conjunctiva/sclera: Conjunctivae normal.  Cardiovascular:     Rate and Rhythm: Normal rate and regular rhythm.     Pulses: Normal pulses.     Heart sounds: Normal heart sounds, S1 normal and S2 normal. No murmur heard.    No friction rub. No gallop.  Pulmonary:     Effort: Pulmonary effort is normal. No respiratory distress.     Breath sounds: Normal breath sounds.  Abdominal:     Palpations: Abdomen is soft.     Tenderness: There is generalized abdominal tenderness. There is no guarding or rebound.     Hernia: No hernia is present.  Musculoskeletal:        General: No swelling.     Cervical back: Full  passive range of motion without pain, normal range of motion and neck supple. No pain with movement, spinous process tenderness or muscular tenderness. Normal range of motion.     Right lower leg: No edema.     Left lower leg: No edema.  Skin:    General: Skin is warm and dry.     Capillary Refill: Capillary refill takes less than 2 seconds.     Findings: No ecchymosis, erythema, lesion or wound.  Neurological:     Mental Status: He is alert and oriented to person, place, and time.     GCS: GCS eye subscore is 4. GCS verbal subscore is 5. GCS motor subscore is 6.     Cranial Nerves: Cranial nerves 2-12 are intact.     Sensory: Sensation is intact.     Motor: Motor function is intact. No weakness or abnormal muscle tone.     Coordination: Coordination is intact.  Psychiatric:        Mood and Affect: Mood normal.        Speech: Speech normal.        Behavior: Behavior normal.     (all labs ordered are listed, but only abnormal results are displayed) Labs Reviewed  COMPREHENSIVE METABOLIC PANEL WITH GFR - Abnormal; Notable for the following components:      Result Value   CO2 19 (*)    Glucose, Bld 122 (*)    All other components within normal limits  CK - Abnormal; Notable for the following components:   Total CK 522 (*)    All other components within normal limits  LIPASE, BLOOD  CBC  URINALYSIS, ROUTINE W REFLEX MICROSCOPIC    EKG: None  Radiology: No results found.   Procedures   Medications Ordered in the ED  sodium chloride  0.9 % bolus 1,000 mL (1,000 mLs Intravenous New Bag/Given 11/14/23 0636)  ondansetron  (ZOFRAN ) injection 4 mg (4 mg Intravenous Given 11/14/23 0621)  diphenoxylate-atropine (LOMOTIL) 2.5-0.025 MG per tablet 2 tablet (2 tablets Oral Given 11/14/23 9371)                                    Medical Decision Making Amount and/or Complexity of Data Reviewed External Data Reviewed: labs and notes. Labs: ordered. Decision-making details documented  in ED Course.  Risk Prescription drug management.   Presents to the emergency department for evaluation of nausea, vomiting and diarrhea.  Patient was recently hospitalized, likely encountered a viral pathogen during hospitalization.  Records reviewed that he was hospitalized for rhabdomyolysis, felt to be secondary to cocaine abuse.  Patient CPK is only 522 today, reassuring.  Lab work is otherwise  normal.  Abdominal exam is benign, nonfocal.  Patient treated with IV fluids, antiemetics, antidiarrheals.  Will discharge with symptomatic treatment.     Final diagnoses:  Nausea, vomiting and diarrhea    ED Discharge Orders          Ordered    ondansetron  (ZOFRAN -ODT) 4 MG disintegrating tablet        11/14/23 0653               Haze Lonni PARAS, MD 11/14/23 [phone]

## 2023-11-14 NOTE — ED Notes (Signed)
 Lab called to add on CK

## 2023-11-15 ENCOUNTER — Telehealth: Payer: Self-pay

## 2023-11-15 NOTE — Transitions of Care (Post Inpatient/ED Visit) (Signed)
   11/15/2023  Name: Dylan [name] MRN: [id number] DOB: 1965/09/11  Today's TOC FU Call Status: Today's TOC FU Call Status:: Unsuccessful Call (3rd Attempt) Unsuccessful Call (1st Attempt) Date: 11/13/23 Unsuccessful Call (2nd Attempt) Date: 11/14/23 Unsuccessful Call (3rd Attempt) Date: 11/15/23  Attempted to reach the patient regarding the most recent Inpatient/ED visit.  Follow Up Plan: Additional outreach attempts will be made to reach the patient to complete the Transitions of Care (Post Inpatient/ED visit) call.    Letter sent to patient requesting he contact this office to schedule a hospital follow up appointment as we have not been able to reach him.  His appointment with Dr Newlin is not until 12/25/2023   Signature Slater Diesel, RN

## 2023-11-28 ENCOUNTER — Telehealth: Payer: Self-pay | Admitting: Family Medicine

## 2023-11-28 NOTE — Telephone Encounter (Signed)
 Unable to confirm appt for 11/20 unable to complete call

## 2023-11-29 ENCOUNTER — Ambulatory Visit: Payer: MEDICAID | Attending: Internal Medicine | Admitting: Internal Medicine

## 2023-11-29 VITALS — BP 136/80 | HR 78 | Temp 98.5°F | Ht 74.0 in | Wt 235.0 lb

## 2023-11-29 DIAGNOSIS — M545 Low back pain, unspecified: Secondary | ICD-10-CM | POA: Diagnosis not present

## 2023-11-29 DIAGNOSIS — M25562 Pain in left knee: Secondary | ICD-10-CM

## 2023-11-29 DIAGNOSIS — M25511 Pain in right shoulder: Secondary | ICD-10-CM

## 2023-11-29 MED ORDER — PREDNISONE 20 MG PO TABS: ORAL_TABLET | ORAL | 0 refills | Status: DC

## 2023-11-29 MED ORDER — CYCLOBENZAPRINE HCL 5 MG PO TABS: 5.0000 mg | ORAL_TABLET | Freq: Two times a day (BID) | ORAL | 0 refills | Status: AC | PRN

## 2023-11-29 NOTE — Patient Instructions (Addendum)
  VISIT SUMMARY: Today, we discussed your ongoing issues with right shoulder pain, lower back pain, and left knee swelling. We reviewed your symptoms and made adjustments to your treatment plan to help manage your pain and discomfort.  YOUR PLAN: -CERVICAL RADICULOPATHY WITH RIGHT UPPER EXTREMITY SYMPTOMS: Cervical radiculopathy is a condition where a nerve in the neck is irritated, causing pain and tingling in the arm. We have prescribed a tapering dose of prednisone to reduce inflammation, and you should continue taking gabapentin  600 mg twice daily. Additionally, we have prescribed Flexeril to help with muscle spasms, but please be cautious as it may cause drowsiness.  -LUMBAR DEGENERATIVE DISC DISEASE WITH CHRONIC LOW BACK PAIN: Degenerative disc disease in the lower back can cause chronic pain. You should continue with your current regimen of stretching and using a heat pad to manage the pain.  -LEFT KNEE SWELLING AND DISCOMFORT, STATUS POST TOTAL KNEE REPLACEMENT: Swelling and discomfort in your left knee, which was replaced two years ago, may need further evaluation. We have referred you to Dr. Harden for a detailed assessment.  INSTRUCTIONS: Please follow up with Dr. Harden for your left knee evaluation. Continue taking your medications as prescribed and monitor your symptoms. If you experience any new or worsening symptoms, please contact our office.                      Contains text generated by Abridge.                                 Contains text generated by Abridge.

## 2023-11-29 NOTE — Progress Notes (Signed)
 Patient ID: Enrique Manganaro, male    DOB: 1965-02-23  MRN: [id number]  CC: Pain (Possible pinched nerve- R shoulder pain radiating to fingers X2 mo/Lower back pain X1 mo/L leg swelling / pain X1 mo)   Subjective: Harshaan Whang is a 59 y.o. male who presents for UC visit.  PCP is Dr. Newlin. His concerns todayinclude:  Hx of HIV, HTN, schizoaffective ds, SUD/cocaine, recurrent rhabdomyolysis, preDM  Discussed the use of AI scribe software for clinical note transcription with the patient, who gave verbal consent to proceed.  History of Present Illness   Markey Deady is a 58 year old male with degenerative disc disease who presents with right shoulder pain and numbness.  He has been experiencing pain behind his right shoulder blade and neck area for almost two months. The pain starts behind his right shoulder blade then comes down his arm, and he experiences numbness and tingling in his index, middle, and ring fingers. Initially constant, the pain is now intermittent, worsens with carrying light objects, and is minimally relieved by stretching. He has tried Tylenol , Aleve , and Advil  without significant relief. Additionally, he experiences numbness and tingling in his index, middle, and ring fingers, which comes and goes.  The pain started after he went camping 2 months ago.  He thinks he may have slept wrong.  He also complains of pain across the lower back.  His lower back pain began a few weeks after the shoulder pain started. The lower back pain is located in the lower back and both buttock muscles, without radiation down the legs. It is aggravated by sitting on couches and relieved by stretching and using a heat pad. The pain is more constant in the evenings. No numbness or tingling in the lower back or legs.  He also reports swelling and discomfort in his left knee, which was replaced two years ago. The swelling has been present for about two months, with no associated injury or falls. The  swelling has increased recently, affecting his gait.  He is thinking he may need Access transit that picks him up from his house because it is too uncomfortable to try to walk to bus stop.  He has a history of neuropathy in both feet, which he describes as normal for him. He is currently taking gabapentin  600 mg twice daily. 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   Patient Active Problem List   Diagnosis Date Noted   Cocaine abuse (HCC) 11/07/2023   MDD (major depressive disorder), recurrent, severe, with psychosis (HCC) 03/12/2023   Transaminitis 01/04/2023   Bipolar 1 disorder, depressed (HCC) 12/05/2022   Non-traumatic rhabdomyolysis 12/04/2022   Weakness 11/03/2022   Cough 11/03/2022   Essential hypertension 11/03/2022   Peripheral neuropathy 11/03/2022   Bipolar disorder (HCC) 08/27/2022   Cannabis use disorder 08/27/2022   Bipolar disorder, most recent episode depressed (HCC) 08/26/2022   Suicidal ideation 08/26/2022   Rhabdomyolysis 07/01/2022   Non-ST elevated myocardial infarction (HCC) 06/03/2022   Dyslipidemia 06/02/2022   Polysubstance abuse (HCC) 06/02/2022   Obesity (BMI 30-39.9) 06/02/2022   Alcohol use disorder 04/12/2022   PTSD (post-traumatic stress disorder) 04/12/2022   S/P total knee arthroplasty, left 12/14/2021   Total knee replacement status, left 12/14/2021   Chronic fatigue 11/16/2021   Abnormal EKG 11/16/2021   Overweight (BMI 25.0-29.9) 11/16/2021   Elevated LFTs 07/03/2021   Right foot ulcer (HCC) 07/03/2021   Onychomycosis 04/19/2021   Stimulant use disorder  03/16/2021   Nodular radiologic density 02/02/2021   Lesion of liver 02/02/2021   Liver disease 02/02/2021   Vitamin D  deficiency 01/18/2021   Methamphetamine abuse (HCC) 01/17/2021   Lesion of pancreas 01/17/2021   Substance induced mood disorder (HCC) 10/26/2020   Fatty liver 03/09/2020   Generalized anxiety disorder 02/03/2020   Lisfranc  dislocation, right, initial encounter    Prediabetes 07/22/2019   Tobacco use disorder 07/10/2018   AKI (acute kidney injury)    Chronic 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 coma (HCC)    Acute hepatitis 02/19/2017   Degenerative disc disease 08/21/2011   HIV disease (HCC) 08/17/2011     Current Outpatient Medications on File Prior to Visit  Medication Sig Dispense Refill   bictegravir-emtricitabine -tenofovir  AF (BIKTARVY ) 50-200-25 MG TABS tablet Take 1 tablet by mouth daily. 30 tablet 11   diclofenac  Sodium (VOLTAREN ) 1 % GEL Apply 4 g topically 4 (four) times daily. 100 g 1   gabapentin  (NEURONTIN ) 300 MG capsule Take 2 capsules (600 mg total) by mouth 2 (two) times daily. 360 capsule 1   hydrOXYzine  (VISTARIL ) 25 MG capsule Take 25 mg by mouth daily as needed for anxiety.     lidocaine  (XYLOCAINE ) 5 % ointment Apply 1 Application topically as needed. 35.44 g 0   nicotine  (NICODERM CQ  - DOSED IN MG/24 HOURS) 21 mg/24hr patch Place 1 patch (21 mg total) onto the skin daily as needed (Nicotine  withdrawal symptoms.). 28 patch 0   ondansetron  (ZOFRAN -ODT) 4 MG disintegrating tablet 4mg  ODT q4 hours prn nausea/vomit 10 tablet 0   pantoprazole  (PROTONIX ) 40 MG tablet Take 1 tablet (40 mg total) by mouth daily. 30 tablet 0   QUEtiapine  Fumarate 150 MG TABS Take 150 mg by mouth at bedtime.     traZODone  (DESYREL ) 150 MG tablet Take 150 mg by mouth at bedtime as needed for sleep.     loperamide  (IMODIUM ) 2 MG capsule Take 1 capsule (2 mg total) by mouth every 6 (six) hours as needed for diarrhea or loose stools. 15 capsule 0   Multiple Vitamin (MULTIVITAMIN WITH MINERALS) TABS tablet Take 1 tablet by mouth daily. (Patient not taking: Reported on 11/29/2023)     naproxen  sodium (ALEVE ) 220 MG tablet Take 220 mg by mouth daily as needed (pain).     traMADol  (ULTRAM ) 50 MG tablet Take 1-2 tablets (50-100 mg total) by mouth every 6 (six) hours as needed. (Patient not taking: Reported on  11/29/2023) 30 tablet 0   No current facility-administered medications on file prior to visit.    Allergies  Allergen Reactions   Penicillins Other (See Comments)    Reaction type/severity unknown, childhood allergy; pt cannot recall all symptoms   Latex Rash   Tape Rash    Social History   Socioeconomic History   Marital status: Single    Spouse name: Not on file   Number of children: 1   Years of education: Not on file   Highest education level: Associate degree: occupational, scientist, product/process development, or vocational program  Occupational History   Not on file  Tobacco Use   Smoking status: Some Days    Current packs/day: 0.50    Average packs/day: 0.5 packs/day for 25.0 years (12.5 ttl pk-yrs)    Types: E-cigarettes, Cigarettes   Smokeless tobacco: Never   Tobacco comments:    Working on quitting, last smoked 07/21/23  Vaping Use   Vaping status: Some Days   Start date: 12/09/2020   Substances: Nicotine   Substance and Sexual Activity   Alcohol use: Yes    Comment: occasional   Drug use: Not Currently    Types: Cocaine, Marijuana    Comment: Last used cocaine March 2025; last used cannabis 2025   Sexual activity: Yes    Partners: Female  Other Topics Concern   Not on file  Social History Narrative   Lives home alone. 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   Social Drivers of Health   Financial Resource Strain: Patient Declined (11/29/2023)   Overall Financial Resource Strain (CARDIA)    Difficulty of Paying Living Expenses: Patient declined  Food Insecurity: Patient Declined (11/29/2023)   Hunger Vital Sign    Worried About Running Out of Food in the Last Year: Patient declined    Ran Out of Food in the Last Year: Patient declined  Recent Concern: Food Insecurity - Food Insecurity Present (11/07/2023)   Hunger Vital Sign    Worried About Running Out of Food in the Last Year: Sometimes true    Ran Out of Food in the Last Year: Sometimes true  Transportation Needs: Unmet  Transportation Needs (11/07/2023)   PRAPARE - Administrator, Civil Service (Medical): Yes    Lack of Transportation (Non-Medical): Yes  Physical Activity: Insufficiently Active (11/29/2023)   Exercise Vital Sign    Days of Exercise per Week: 3 days    Minutes of Exercise per Session: 30 min  Stress: Stress Concern Present (11/29/2023)   [employer] of Occupational Health - Occupational Stress Questionnaire    Feeling of Stress: To some extent  Social Connections: Unknown (11/29/2023)   Social Connection and Isolation Panel    Frequency of Communication with Friends and Family: Patient declined    Frequency of Social Gatherings with Friends and Family: Patient declined    Attends Religious Services: Patient declined    Active Member of Clubs or Organizations: Patient declined    Attends Banker Meetings: Not on file    Marital Status: Patient declined  Intimate Partner Violence: Not At Risk (11/07/2023)   Humiliation, Afraid, Rape, and Kick questionnaire    Fear of Current or Ex-Partner: No    Emotionally Abused: No    Physically Abused: No    Sexually Abused: No    Family History  Problem Relation Age of Onset   CAD Mother    Diabetes Father    Stroke Father    Colon cancer Maternal Aunt 60   Diabetes Maternal Aunt    Heart disease Maternal Uncle    Heart attack Maternal Uncle    Esophageal cancer Neg Hx    Rectal cancer Neg Hx    Stomach cancer Neg Hx     Past Surgical History:  Procedure Laterality Date   ANAL FISTULOTOMY  07/27/2023   Procedure: ANAL FISTULOTOMY;  Surgeon: Debby Hila, MD;  Location: [facility];  Service: General;;   COLONOSCOPY     FOOT ARTHRODESIS Right 09/17/2019   Procedure: FUSION RIGHT LISFRANC JOINT;  Surgeon: Harden Jerona GAILS, MD;  Location: [facility];  Service: Orthopedics;  Laterality: Right;   FOOT ARTHRODESIS Right 09/2019   HEMORROIDECTOMY     HIGH RESOLUTION ANOSCOPY N/A 07/27/2023    Procedure: ANOSCOPY, HIGH RESOLUTION;  Surgeon: Debby Hila, MD;  Location: [facility];  Service: General;  Laterality: N/A;   IR FLUORO GUIDE CV LINE RIGHT  02/22/2017   IR US  GUIDE VASC ACCESS RIGHT  02/22/2017   LASER ABLATION CONDOLAMATA N/A  10/25/2023   Procedure: ABLATION, CONDYLOMA, USING LASER;  Surgeon: Debby Hila, MD;  Location: [facility];  Service: General;  Laterality: N/A;  LASER ABLATION AND BIOPSY AIN   RECTAL BIOPSY N/A 07/27/2023   Procedure: BIOPSY AND ABLATION, RECTUM;  Surgeon: Debby Hila, MD;  Location: [facility];  Service: General;  Laterality: N/A;   TOTAL KNEE ARTHROPLASTY Left 12/14/2021   Procedure: LEFT TOTAL KNEE ARTHROPLASTY;  Surgeon: Harden Jerona GAILS, MD;  Location: [facility];  Service: Orthopedics;  Laterality: Left;   TUMOR REMOVAL     From Chest    ROS: Review of Systems Negative except as stated above  PHYSICAL EXAM: BP 136/80 (BP Location: Left Arm, Patient Position: Sitting, Cuff Size: Normal)   Pulse 78   Temp 98.5 F (36.9 C) (Oral)   Ht 6' 2 (1.88 m)   Wt 235 lb (106.6 kg)   SpO2 99%   BMI 30.17 kg/m   Physical Exam General appearance - alert, well appearing, and in no distress Mental status - normal mood, behavior, speech, dress, motor activity, and thought processes Chest - clear to auscultation, no wheezes, rales or rhonchi, symmetric air entry Musculoskeletal -neck: No point tenderness of the cervical spine.  He experiences mild discomfort in the right trapezius muscle with neck extension and rotation.  Mild tenderness on palpation over the right trapezius muscle.  No tenderness on palpation of the glenohumeral joint.  He has good range of motion of the right shoulder. Grip 5/5 bilaterally.  Strength 5/5 proximally and distally in the upper extremities. Gross sensation intact in both upper extremity. No tenderness on palpation of the lumbosacral spine or surrounding paraspinal muscles.  Straight leg raise  negative on both sides.  Power in both lower extremities 5/5 proximally and distally. Left knee: Healed surgical scar in the midline.  The knee itself appears edematous.  He has good range of motion.  No point tenderness.     Latest Ref Rng & Units 11/14/2023    4:47 AM 11/09/2023    5:37 AM 11/08/2023    5:50 AM  CMP  Glucose 70 - 99 mg/dL 877  97  893   BUN 6 - 20 mg/dL 11  13  16    Creatinine 0.61 - 1.24 mg/dL 9.06  8.97  9.15   Sodium 135 - 145 mmol/L 135  137  140   Potassium 3.5 - 5.1 mmol/L 3.7  3.7  4.1   Chloride 98 - 111 mmol/L 102  104  108   CO2 22 - 32 mmol/L 19  23  25    Calcium  8.9 - 10.3 mg/dL 9.2  9.0  8.5   Total Protein 6.5 - 8.1 g/dL 7.7   5.8   Total Bilirubin 0.0 - 1.2 mg/dL 0.5   0.3   Alkaline Phos 38 - 126 U/L 69   76   AST 15 - 41 U/L 30   45   ALT 0 - 44 U/L 32   39    Lipid Panel     Component Value Date/Time   CHOL 261 (H) 02/14/2023 1041   CHOL 158 02/13/2019 1128   TRIG 208 (H) 02/14/2023 1041   HDL 53 02/14/2023 1041   HDL 41 02/13/2019 1128   CHOLHDL 4.9 02/14/2023 1041   VLDL 50 (H) 12/08/2022 0644   LDLCALC 171 (H) 02/14/2023 1041    CBC    Component Value Date/Time   WBC 9.5 11/14/2023 0447   RBC 4.66 11/14/2023 0447  HGB 14.3 11/14/2023 0447   HCT 42.9 11/14/2023 0447   PLT 237 11/14/2023 0447   MCV 92.1 11/14/2023 0447   MCH 30.7 11/14/2023 0447   MCHC 33.3 11/14/2023 0447   RDW 12.7 11/14/2023 0447   LYMPHSABS 2.5 11/07/2023 0506   MONOABS 0.6 11/07/2023 0506   EOSABS 0.2 11/07/2023 0506   BASOSABS 0.0 11/07/2023 0506    ASSESSMENT AND PLAN: 1. Acute pain of right shoulder (Primary) I think he has a nerve irritation coming from the neck.  May have been caused by bad posturing during the time he was camping.  Will try him with a prednisone  taper to see if this can quiet down.  If not he will need imaging of the neck. - predniSONE  (DELTASONE ) 20 MG tablet; 1 tab PO daily x 3 days then 1/2 tab PO daily x 4 days  Dispense: 5  tablet; Refill: 0  2. Left knee pain, unspecified chronicity Will get him back to Dr. Harden who did his knee replacement surgery for evaluation. - AMB referral to orthopedics  3. Acute bilateral low back pain without sciatica Will try with prednisone  and Flexeril .  Advised that the Flexeril  can cause some drowsiness. - predniSONE  (DELTASONE ) 20 MG tablet; 1 tab PO daily x 3 days then 1/2 tab PO daily x 4 days  Dispense: 5 tablet; Refill: 0 - cyclobenzaprine  (FLEXERIL ) 5 MG tablet; Take 1 tablet (5 mg total) by mouth 2 (two) times daily as needed for muscle spasms.  Dispense: 20 tablet; Refill: 0   Patient was given the opportunity to ask questions.  Patient verbalized understanding of the plan and was able to repeat key elements of the plan.   This documentation was completed using Paediatric nurse.  Any transcriptional errors are unintentional.  No orders of the defined types were placed in this encounter.    Requested Prescriptions    No prescriptions requested or ordered in this encounter    No follow-ups on file.  Barnie Louder, MD, FACP

## 2023-12-21 ENCOUNTER — Encounter (HOSPITAL_COMMUNITY): Payer: Self-pay | Admitting: Emergency Medicine

## 2023-12-21 ENCOUNTER — Emergency Department (HOSPITAL_COMMUNITY)
Admission: EM | Admit: 2023-12-21 | Discharge: 2023-12-21 | Discharge status: Against Medical Advice | Payer: MEDICAID | Attending: Emergency Medicine | Admitting: Emergency Medicine

## 2023-12-21 ENCOUNTER — Emergency Department (HOSPITAL_COMMUNITY): Payer: MEDICAID

## 2023-12-21 DIAGNOSIS — M79601 Pain in right arm: Secondary | ICD-10-CM | POA: Insufficient documentation

## 2023-12-21 DIAGNOSIS — Z5321 Procedure and treatment not carried out due to patient leaving prior to being seen by health care provider: Secondary | ICD-10-CM | POA: Insufficient documentation

## 2023-12-21 LAB — CBC
HCT: 43.2 % (ref 39.0–52.0)
Hemoglobin: 14.7 g/dL (ref 13.0–17.0)
MCH: 30.9 pg (ref 26.0–34.0)
MCHC: 34 g/dL (ref 30.0–36.0)
MCV: 90.9 fL (ref 80.0–100.0)
Platelets: 212 K/uL (ref 150–400)
RBC: 4.75 MIL/uL (ref 4.22–5.81)
RDW: 13.1 % (ref 11.5–15.5)
WBC: 9.2 K/uL (ref 4.0–10.5)
nRBC: 0 % (ref 0.0–0.2)

## 2023-12-21 LAB — BASIC METABOLIC PANEL WITH GFR
Anion gap: 8 (ref 5–15)
BUN: 17 mg/dL (ref 6–20)
CO2: 22 mmol/L (ref 22–32)
Calcium: 8.9 mg/dL (ref 8.9–10.3)
Chloride: 102 mmol/L (ref 98–111)
Creatinine, Ser: 0.92 mg/dL (ref 0.61–1.24)
GFR, Estimated: >60 mL/min (ref >60)
Glucose, Bld: 106 mg/dL — ABNORMAL HIGH (ref 70–99)
Potassium: 4.1 mmol/L (ref 3.5–5.1)
Sodium: 132 mmol/L — ABNORMAL LOW (ref 135–145)

## 2023-12-21 LAB — TROPONIN I (HIGH SENSITIVITY)
Troponin I (High Sensitivity): 5 ng/L (ref <18)
Troponin I (High Sensitivity): 5 ng/L (ref <18)

## 2023-12-21 NOTE — ED Notes (Signed)
 Patient has left couple hrs ago.

## 2023-12-21 NOTE — ED Triage Notes (Addendum)
 Pain in right arm for several months. Did steroid and muscle relaxer and did not help. Pain was really bad tonight that chest started hurting. Pain radiates into r arm and fingers go numb. 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 Denies diaphoresis or SOB.

## 2023-12-25 ENCOUNTER — Other Ambulatory Visit: Payer: Self-pay

## 2023-12-25 ENCOUNTER — Encounter (HOSPITAL_COMMUNITY): Payer: Self-pay

## 2023-12-25 ENCOUNTER — Ambulatory Visit: Payer: Self-pay | Admitting: Family Medicine

## 2023-12-25 ENCOUNTER — Observation Stay (HOSPITAL_COMMUNITY)
Admission: EM | Admit: 2023-12-25 | Discharge: 2023-12-27 | Disposition: A | Payer: MEDICAID | Attending: Internal Medicine | Admitting: Internal Medicine

## 2023-12-25 DIAGNOSIS — F259 Schizoaffective disorder, unspecified: Secondary | ICD-10-CM | POA: Diagnosis not present

## 2023-12-25 DIAGNOSIS — Z96652 Presence of left artificial knee joint: Secondary | ICD-10-CM | POA: Diagnosis not present

## 2023-12-25 DIAGNOSIS — B20 Human immunodeficiency virus [HIV] disease: Secondary | ICD-10-CM | POA: Diagnosis not present

## 2023-12-25 DIAGNOSIS — M6282 Rhabdomyolysis: Secondary | ICD-10-CM | POA: Diagnosis not present

## 2023-12-25 DIAGNOSIS — F1090 Alcohol use, unspecified, uncomplicated: Secondary | ICD-10-CM | POA: Diagnosis not present

## 2023-12-25 DIAGNOSIS — F17290 Nicotine dependence, other tobacco product, uncomplicated: Secondary | ICD-10-CM | POA: Diagnosis not present

## 2023-12-25 DIAGNOSIS — F1410 Cocaine abuse, uncomplicated: Secondary | ICD-10-CM | POA: Diagnosis not present

## 2023-12-25 DIAGNOSIS — R7303 Prediabetes: Secondary | ICD-10-CM | POA: Diagnosis not present

## 2023-12-25 DIAGNOSIS — R55 Syncope and collapse: Secondary | ICD-10-CM | POA: Diagnosis present

## 2023-12-25 DIAGNOSIS — R7401 Elevation of levels of liver transaminase levels: Secondary | ICD-10-CM | POA: Diagnosis not present

## 2023-12-25 DIAGNOSIS — G629 Polyneuropathy, unspecified: Secondary | ICD-10-CM | POA: Diagnosis not present

## 2023-12-25 NOTE — ED Provider Notes (Incomplete)
 [place] EMERGENCY DEPARTMENT AT [facility] Provider Note   CSN: [id number] Arrival date & time: 12/25/23  2249     Patient presents with: Loss of Consciousness   Dylan [name] is a 58 y.o. male with history of polysubstance abuse, bipolar disorder, HIV, DDD, AKI, chronic viral hepatitis B, rhabdomyolysis.  Presents to ED complaining of loss of consciousness.  Patient reports that today he was on the bus on his way home from work.  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  States that he went from sitting to a standing position and became very lightheaded and dizzy and fell to the ground and had a syncopal event.  Denies any chest pain or shortness of breath prior.  Denies any current chest pain or shortness of breath.  He reports he is unsure if he hit his head when he fell but does report he has a headache now and increased neck pain.  Reports history of neck pain but states the neck pain is now increased.  He does not remember the events that led to him falling.  He reports he might be dehydrated.  States that he has had dark-colored urine for the last few days and he is concerned he has rhabdomyolysis.  Denies fevers at home.  Denies any nausea, vomit, diarrhea abdominal pain.   Loss of Consciousness      Prior to Admission medications  Medication Sig Start Date End Date Taking? Authorizing Provider  bictegravir-emtricitabine -tenofovir  AF (BIKTARVY ) 50-200-25 MG TABS tablet Take 1 tablet by mouth daily. 07/23/23   Luiz Channel, MD  cyclobenzaprine  (FLEXERIL ) 5 MG tablet Take 1 tablet (5 mg total) by mouth 2 (two) times daily as needed for muscle spasms. 11/29/23   Vicci Barnie NOVAK, MD  diclofenac  Sodium (VOLTAREN ) 1 % GEL Apply 4 g topically 4 (four) times daily. 06/19/23   Newlin, Enobong, MD  gabapentin  (NEURONTIN ) 300 MG capsule Take 2 capsules (600 mg total) by mouth 2 (two) times daily. 07/07/23   Newlin, Enobong, MD  hydrOXYzine  (VISTARIL ) 25 MG capsule Take 25 mg by mouth  daily as needed for anxiety. 07/17/23   [provider]  lidocaine  (XYLOCAINE ) 5 % ointment Apply 1 Application topically as needed. 10/25/23   Debby Hila, MD  loperamide  (IMODIUM ) 2 MG capsule Take 1 capsule (2 mg total) by mouth every 6 (six) hours as needed for diarrhea or loose stools. 11/10/23   Adhikari, Amrit, MD  Multiple Vitamin (MULTIVITAMIN WITH MINERALS) TABS tablet Take 1 tablet by mouth daily. Patient not taking: Reported on 11/29/2023    [provider]  naproxen  sodium (ALEVE ) 220 MG tablet Take 220 mg by mouth daily as needed (pain).    [provider]  nicotine  (NICODERM CQ  - DOSED IN MG/24 HOURS) 21 mg/24hr patch Place 1 patch (21 mg total) onto the skin daily as needed (Nicotine  withdrawal symptoms.). 11/10/23   Adhikari, Amrit, MD  ondansetron  (ZOFRAN -ODT) 4 MG disintegrating tablet 4mg  ODT q4 hours prn nausea/vomit 11/14/23   Pollina, Lonni PARAS, MD  pantoprazole  (PROTONIX ) 40 MG tablet Take 1 tablet (40 mg total) by mouth daily. 11/11/23   Jillian Buttery, MD  predniSONE  (DELTASONE ) 20 MG tablet 1 tab PO daily x 3 days then 1/2 tab PO daily x 4 days 11/29/23   Vicci Barnie NOVAK, MD  QUEtiapine  Fumarate 150 MG TABS Take 150 mg by mouth at bedtime. 07/17/23   [provider]  traMADol  (ULTRAM ) 50 MG tablet Take 1-2 tablets (50-100 mg total) by mouth  every 6 (six) hours as needed. Patient not taking: Reported on 11/29/2023 10/25/23   Debby Hila, MD  traZODone  (DESYREL ) 150 MG tablet Take 150 mg by mouth at bedtime as needed for sleep.    [provider]    Allergies: Penicillins, Latex, and Tape    Review of Systems  Cardiovascular:  Positive for syncope.    Updated Vital Signs BP 126/78 (BP Location: Left Arm)   Pulse 77   Temp 98.2 F (36.8 C) (Oral)   Resp 17   SpO2 96%   Physical Exam  (all labs ordered are listed, but only abnormal results are displayed) Labs Reviewed  COMPREHENSIVE METABOLIC PANEL WITH GFR   CBC WITH DIFFERENTIAL/PLATELET  URINALYSIS, ROUTINE W REFLEX MICROSCOPIC  CK  URINE DRUG SCREEN  CBG MONITORING, ED    EKG: None  Radiology: No results found.  {Document cardiac monitor, telemetry assessment procedure when appropriate:32947} Procedures   Medications Ordered in the ED - No data to display    {Click here for ABCD2, HEART and other calculators REFRESH Note before signing:1}                              Medical Decision Making Amount and/or Complexity of Data Reviewed Labs: ordered. Radiology: ordered.   ***  {Document critical care time when appropriate  Document review of labs and clinical decision tools ie CHADS2VASC2, etc  Document your independent review of radiology images and any outside records  Document your discussion with family members, caretakers and with consultants  Document social determinants of health affecting pt's care  Document your decision making why or why not admission, treatments were needed:32947:::1}   Final diagnoses:  None    ED Discharge Orders     None

## 2023-12-25 NOTE — ED Triage Notes (Signed)
 Pt BIB EMS from the bus stop. Bus driver said pt stood up to get off of the bus and just fell to the ground. Pt does not remember falling. Pt denies head pain. Pt has had dark urine x 1 week.

## 2023-12-25 NOTE — ED Provider Notes (Signed)
 [place] EMERGENCY DEPARTMENT AT [facility] Provider Note   CSN: [id number] Arrival date & time: 12/25/23  2249     Patient presents with: Loss of Consciousness   Dylan [name] is a 58 y.o. male with history of polysubstance abuse, bipolar disorder, HIV, DDD, AKI, chronic viral hepatitis B, rhabdomyolysis.  Presents to ED complaining of loss of consciousness.  Patient reports that today he was on the bus on his way home from work.  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  States that he went from sitting to a standing position and became very lightheaded and dizzy and fell to the ground and had a syncopal event.  Denies any chest pain or shortness of breath prior.  Denies any current chest pain or shortness of breath.  He reports he is unsure if he hit his head when he fell but does report he has a headache now and increased neck pain.  Reports history of neck pain but states the neck pain is now increased.  He does not remember the events that led to him falling.  He reports he might be dehydrated.  States that he has had dark-colored urine for the last few days and he is concerned he has rhabdomyolysis.  Denies fevers at home.  Denies any nausea, vomit, diarrhea abdominal pain.   Loss of Consciousness      Prior to Admission medications  Medication Sig Start Date End Date Taking? Authorizing Provider  bictegravir-emtricitabine -tenofovir  AF (BIKTARVY ) 50-200-25 MG TABS tablet Take 1 tablet by mouth daily. 07/23/23  Yes Luiz Channel, MD  gabapentin  (NEURONTIN ) 300 MG capsule Take 2 capsules (600 mg total) by mouth 2 (two) times daily. 07/07/23  Yes Newlin, Enobong, MD  pantoprazole  (PROTONIX ) 40 MG tablet Take 1 tablet (40 mg total) by mouth daily. 11/11/23  Yes Jillian Buttery, MD  QUEtiapine  Fumarate 150 MG TABS Take 150 mg by mouth daily as needed. 07/17/23  Yes [provider]  traZODone  (DESYREL ) 150 MG tablet Take 150 mg by mouth at bedtime as needed for sleep.   Yes  [provider]  cyclobenzaprine  (FLEXERIL ) 5 MG tablet Take 1 tablet (5 mg total) by mouth 2 (two) times daily as needed for muscle spasms. Patient not taking: Reported on 12/26/2023 11/29/23   Vicci Barnie NOVAK, MD  diclofenac  Sodium (VOLTAREN ) 1 % GEL Apply 4 g topically 4 (four) times daily. Patient not taking: Reported on 12/26/2023 06/19/23   Newlin, Enobong, MD  hydrOXYzine  (VISTARIL ) 25 MG capsule Take 25 mg by mouth daily as needed for anxiety. Patient not taking: Reported on 12/26/2023 07/17/23   [provider]  lidocaine  (XYLOCAINE ) 5 % ointment Apply 1 Application topically as needed. Patient not taking: Reported on 12/26/2023 10/25/23   Thomas, Alicia, MD  Multiple Vitamin (MULTIVITAMIN WITH MINERALS) TABS tablet Take 1 tablet by mouth daily. Patient not taking: Reported on 11/29/2023    [provider]  nicotine  (NICODERM CQ  - DOSED IN MG/24 HOURS) 21 mg/24hr patch Place 1 patch (21 mg total) onto the skin daily as needed (Nicotine  withdrawal symptoms.). Patient not taking: Reported on 12/26/2023 11/10/23   Jillian Buttery, MD  ondansetron  (ZOFRAN -ODT) 4 MG disintegrating tablet 4mg  ODT q4 hours prn nausea/vomit 11/14/23   Pollina, Arraya Buck J, MD  predniSONE  (DELTASONE ) 20 MG tablet 1 tab PO daily x 3 days then 1/2 tab PO daily x 4 days 11/29/23   Vicci Barnie NOVAK, MD  traMADol  (ULTRAM ) 50 MG tablet Take 1-2 tablets (50-100 mg total) by  mouth every 6 (six) hours as needed. Patient not taking: No sig reported 10/25/23   Debby Hila, MD    Allergies: Penicillins, Latex, and Tape    Review of Systems  Cardiovascular:  Positive for syncope.  Neurological:  Positive for syncope.  All other systems reviewed and are negative.   Updated Vital Signs BP 122/81   Pulse 100   Temp 98.2 F (36.8 C) (Oral)   Resp 18   SpO2 97%   Physical Exam Vitals and nursing note reviewed.  Constitutional:      General: He is not in acute distress.     Appearance: He is well-developed.  HENT:     Head: Normocephalic and atraumatic.  Eyes:     Conjunctiva/sclera: Conjunctivae normal.  Cardiovascular:     Rate and Rhythm: Normal rate and regular rhythm.     Heart sounds: No murmur heard. Pulmonary:     Effort: Pulmonary effort is normal. No respiratory distress.     Breath sounds: Normal breath sounds.  Abdominal:     Palpations: Abdomen is soft.     Tenderness: There is no abdominal tenderness.  Musculoskeletal:        General: No swelling.     Cervical back: Neck supple.  Skin:    General: Skin is warm and dry.     Capillary Refill: Capillary refill takes less than 2 seconds.  Neurological:     Mental Status: He is alert and oriented to person, place, and time. Mental status is at baseline.     Comments: Reassuring neurological examination  Psychiatric:        Mood and Affect: Mood normal.     (all labs ordered are listed, but only abnormal results are displayed) Labs Reviewed  COMPREHENSIVE METABOLIC PANEL WITH GFR - Abnormal; Notable for the following components:      Result Value   BUN 24 (*)    AST 190 (*)    ALT 77 (*)    All other components within normal limits  CK - Abnormal; Notable for the following components:   Total CK 8,443 (*)    All other components within normal limits  CBC WITH DIFFERENTIAL/PLATELET  URINALYSIS, ROUTINE W REFLEX MICROSCOPIC  URINE DRUG SCREEN  CBG MONITORING, ED    EKG: EKG Interpretation Date/Time:  Tuesday December 25 2023 23:17:52 EST Ventricular Rate:  80 PR Interval:  144 QRS Duration:  96 QT Interval:  410 QTC Calculation: 473 R Axis:   72  Text Interpretation: Sinus rhythm Biatrial enlargement Left ventricular hypertrophy Artifact in lead(s) I II aVR aVL aVF Otherwise no significant change Confirmed by Trine Likes [phone]) on 12/26/2023 1:40:29 AM  Radiology: CT Cervical Spine Wo Contrast Result Date: 12/26/2023 EXAM: CT CERVICAL SPINE WITHOUT CONTRAST  12/26/2023 02:33:55 AM TECHNIQUE: CT of the cervical spine was performed without the administration of intravenous contrast. Multiplanar reformatted images are provided for review. Automated exposure control, iterative reconstruction, and/or weight based adjustment of the mA/kV was utilized to reduce the radiation dose to as low as reasonably achievable. COMPARISON: None available. CLINICAL HISTORY: Recent fall with neck pain, additional encounter. FINDINGS: BONES AND ALIGNMENT: 7 cervical segments are well visualized. Vertebral body height is well maintained. No acute fracture or acute facet abnormality is noted. DEGENERATIVE CHANGES: Mild osteophytic changes are seen. Mild facet hypertrophic changes are noted. SOFT TISSUES: Surrounding soft tissue structures are unremarkable. LUNG APICES: Visualized lung apices are within normal limits. IMPRESSION: 1. No acute cervical spine fracture or acute  facet abnormality. 2. Mild cervical spondylosis and facet hypertrophy. Electronically signed by: Oneil Devonshire MD 12/26/2023 02:40 AM EST RP Workstation: [id number]   CT Head Wo Contrast Result Date: 12/26/2023 EXAM: CT HEAD WITHOUT CONTRAST 12/26/2023 02:33:55 AM TECHNIQUE: CT of the head was performed without the administration of intravenous contrast. Automated exposure control, iterative reconstruction, and/or weight based adjustment of the mA/kV was utilized to reduce the radiation dose to as low as reasonably achievable. COMPARISON: 07/11/2023 CLINICAL HISTORY: Head trauma, moderate-severe. FINDINGS: BRAIN AND VENTRICLES: No acute hemorrhage. No evidence of acute infarct. No extra-axial collection. No mass effect or midline shift. ORBITS: Old left medial orbital wall fracture. SINUSES: No acute abnormality. SOFT TISSUES AND SKULL: No acute soft tissue abnormality. No skull fracture. IMPRESSION: 1. No acute intracranial abnormality. Electronically signed by: Oneil Devonshire MD 12/26/2023 02:39 AM EST RP Workstation:  [id number]    Procedures   Medications Ordered in the ED  bictegravir-emtricitabine -tenofovir  AF (BIKTARVY ) 50-200-25 MG per tablet 1 tablet (has no administration in time range)  sodium chloride  0.9 % bolus 1,000 mL (1,000 mLs Intravenous New Bag/Given 12/26/23 0242)     Medical Decision Making Amount and/or Complexity of Data Reviewed Labs: ordered. Radiology: ordered.   This is a 58 year old male presenting to the ED complaining of syncope, darker colored urine.  History of rhabdomyolysis which has been attributed to cocaine use in the past.  On exam, HD stable.  Afebrile and nontachycardic.  Lung sounds clear bilaterally, no hypoxia.  Abdomen soft and compressible.  Neuroexam at baseline.  Patient is alert and oriented x 4.  Labs collected.  CBC, CMP, urinalysis, UDS, CK, EKG.  Have also added on CT head and cervical spine as patient is unsure if he hit his head during fall.  CBC without leukocytosis or anemia.  Metabolic panel with BUN 24, AST 190, ALT 77.  No elevated bilirubin.  No electrolyte derangement.  CK elevated 8443.  UDS and urinalysis pending at this time.  CT head unremarkable.  CT cervical spine unremarkable.  Patient given liter of fluid, had been given 500 mL bag of fluid with EMS.  Patient will be admitted at this time for rhabdomyolysis.  Discussed with Dr. Shona, [facility] hospitalist, who has agreed to admit patient.  Patient stable on admission.    Final diagnoses:  Non-traumatic rhabdomyolysis    ED Discharge Orders     None          Ruthell Lonni JULIANNA DEVONNA 12/26/23 0302    Trine Raynell Moder, MD 12/26/23 [phone]

## 2023-12-26 ENCOUNTER — Observation Stay (HOSPITAL_COMMUNITY): Payer: MEDICAID

## 2023-12-26 ENCOUNTER — Emergency Department (HOSPITAL_COMMUNITY): Payer: MEDICAID

## 2023-12-26 DIAGNOSIS — R55 Syncope and collapse: Secondary | ICD-10-CM

## 2023-12-26 LAB — URINE DRUG SCREEN
Amphetamines: NEGATIVE
Barbiturates: NEGATIVE
Benzodiazepines: NEGATIVE
Cocaine: POSITIVE — AB
Fentanyl: NEGATIVE
Methadone Scn, Ur: NEGATIVE
Opiates: NEGATIVE
Tetrahydrocannabinol: NEGATIVE

## 2023-12-26 LAB — COMPREHENSIVE METABOLIC PANEL WITH GFR
ALT: 77 U/L — ABNORMAL HIGH (ref 0–44)
ALT: 65 U/L — ABNORMAL HIGH (ref 0–44)
AST: 190 U/L — ABNORMAL HIGH (ref 15–41)
AST: 152 U/L — ABNORMAL HIGH (ref 15–41)
Albumin: 4.5 g/dL (ref 3.5–5.0)
Albumin: 4 g/dL (ref 3.5–5.0)
Alkaline Phosphatase: 109 U/L (ref 38–126)
Alkaline Phosphatase: 94 U/L (ref 38–126)
Anion gap: 12 (ref 5–15)
Anion gap: 11 (ref 5–15)
BUN: 24 mg/dL — ABNORMAL HIGH (ref 6–20)
BUN: 23 mg/dL — ABNORMAL HIGH (ref 6–20)
CO2: 24 mmol/L (ref 22–32)
CO2: 22 mmol/L (ref 22–32)
Calcium: 10 mg/dL (ref 8.9–10.3)
Calcium: 9.3 mg/dL (ref 8.9–10.3)
Chloride: 101 mmol/L (ref 98–111)
Chloride: 104 mmol/L (ref 98–111)
Creatinine, Ser: 0.91 mg/dL (ref 0.61–1.24)
Creatinine, Ser: 1.04 mg/dL (ref 0.61–1.24)
GFR, Estimated: >60 mL/min (ref >60)
GFR, Estimated: >60 mL/min (ref >60)
Glucose, Bld: 96 mg/dL (ref 70–99)
Glucose, Bld: 106 mg/dL — ABNORMAL HIGH (ref 70–99)
Potassium: 3.9 mmol/L (ref 3.5–5.1)
Potassium: 3.9 mmol/L (ref 3.5–5.1)
Sodium: 137 mmol/L (ref 135–145)
Sodium: 137 mmol/L (ref 135–145)
Total Bilirubin: 0.6 mg/dL (ref 0.0–1.2)
Total Bilirubin: 0.5 mg/dL (ref 0.0–1.2)
Total Protein: 8.1 g/dL (ref 6.5–8.1)
Total Protein: 7.2 g/dL (ref 6.5–8.1)

## 2023-12-26 LAB — CBC
HCT: 42.8 % (ref 39.0–52.0)
Hemoglobin: 14.1 g/dL (ref 13.0–17.0)
MCH: 30.3 pg (ref 26.0–34.0)
MCHC: 32.9 g/dL (ref 30.0–36.0)
MCV: 91.8 fL (ref 80.0–100.0)
Platelets: 220 K/uL (ref 150–400)
RBC: 4.66 MIL/uL (ref 4.22–5.81)
RDW: 13.4 % (ref 11.5–15.5)
WBC: 6 K/uL (ref 4.0–10.5)
nRBC: 0 % (ref 0.0–0.2)

## 2023-12-26 LAB — URINALYSIS, ROUTINE W REFLEX MICROSCOPIC
Bacteria, UA: NONE SEEN
Bilirubin Urine: NEGATIVE
Glucose, UA: NEGATIVE mg/dL
Hgb urine dipstick: NEGATIVE
Ketones, ur: 20 mg/dL — AB
Leukocytes,Ua: NEGATIVE
Nitrite: NEGATIVE
Protein, ur: 100 mg/dL — AB
Specific Gravity, Urine: 1.033 — ABNORMAL HIGH (ref 1.005–1.030)
pH: 5 (ref 5.0–8.0)

## 2023-12-26 LAB — CK
Total CK: 8443 U/L — ABNORMAL HIGH (ref 49–397)
Total CK: 6521 U/L — ABNORMAL HIGH (ref 49–397)

## 2023-12-26 LAB — CBC WITH DIFFERENTIAL/PLATELET
Abs Immature Granulocytes: 0.02 K/uL (ref 0.00–0.07)
Basophils Absolute: 0 K/uL (ref 0.0–0.1)
Basophils Relative: 1 %
Eosinophils Absolute: 0.1 K/uL (ref 0.0–0.5)
Eosinophils Relative: 2 %
HCT: 44.6 % (ref 39.0–52.0)
Hemoglobin: 14.9 g/dL (ref 13.0–17.0)
Immature Granulocytes: 0 %
Lymphocytes Relative: 35 %
Lymphs Abs: 2.9 K/uL (ref 0.7–4.0)
MCH: 30.5 pg (ref 26.0–34.0)
MCHC: 33.4 g/dL (ref 30.0–36.0)
MCV: 91.2 fL (ref 80.0–100.0)
Monocytes Absolute: 0.7 K/uL (ref 0.1–1.0)
Monocytes Relative: 9 %
Neutro Abs: 4.6 K/uL (ref 1.7–7.7)
Neutrophils Relative %: 53 %
Platelets: 238 K/uL (ref 150–400)
RBC: 4.89 MIL/uL (ref 4.22–5.81)
RDW: 13.2 % (ref 11.5–15.5)
WBC: 8.4 K/uL (ref 4.0–10.5)
nRBC: 0 % (ref 0.0–0.2)

## 2023-12-26 LAB — CBG MONITORING, ED: Glucose-Capillary: 93 mg/dL (ref 70–99)

## 2023-12-26 LAB — ECHOCARDIOGRAM COMPLETE
Area-P 1/2: 3.56 cm2
S' Lateral: 3.4 cm

## 2023-12-26 LAB — PHOSPHORUS: Phosphorus: 3.5 mg/dL (ref 2.5–4.6)

## 2023-12-26 LAB — MAGNESIUM: Magnesium: 2.6 mg/dL — ABNORMAL HIGH (ref 1.7–2.4)

## 2023-12-26 MED ORDER — BICTEGRAVIR-EMTRICITAB-TENOFOV 50-200-25 MG PO TABS
1.0000 | ORAL_TABLET | Freq: Every day | ORAL | Status: DC
  Administered 2023-12-27: 09:00:00 1 via ORAL
  Filled 2023-12-26 (×2): qty 1

## 2023-12-26 MED ORDER — QUETIAPINE FUMARATE 50 MG PO TABS: 150.0000 mg | ORAL_TABLET | Freq: Every day | ORAL | Status: DC | PRN

## 2023-12-26 MED ORDER — MELATONIN 5 MG PO TABS: 5.0000 mg | ORAL_TABLET | Freq: Every evening | ORAL | Status: DC | PRN

## 2023-12-26 MED ORDER — HYDROXYZINE HCL 25 MG PO TABS
25.0000 mg | ORAL_TABLET | Freq: Four times a day (QID) | ORAL | Status: AC | PRN
  Administered 2023-12-26: 21:00:00 25 mg via ORAL
  Filled 2023-12-26: qty 1

## 2023-12-26 MED ORDER — OXYCODONE HCL 5 MG PO TABS: 5.0000 mg | ORAL_TABLET | ORAL | Status: DC | PRN

## 2023-12-26 MED ORDER — QUETIAPINE FUMARATE 150 MG PO TABS: 150.0000 mg | ORAL_TABLET | Freq: Every day | ORAL | Status: DC | PRN

## 2023-12-26 MED ORDER — LACTATED RINGERS IV SOLN: INTRAVENOUS | Status: AC

## 2023-12-26 MED ORDER — PROCHLORPERAZINE EDISYLATE 10 MG/2ML IJ SOLN: 5.0000 mg | Freq: Four times a day (QID) | INTRAMUSCULAR | Status: DC | PRN

## 2023-12-26 MED ORDER — POLYETHYLENE GLYCOL 3350 17 G PO PACK: 17.0000 g | PACK | Freq: Every day | ORAL | Status: DC | PRN

## 2023-12-26 MED ORDER — SODIUM CHLORIDE 0.9 % IV BOLUS
1000.0000 mL | Freq: Once | INTRAVENOUS | Status: AC
  Administered 2023-12-26: 03:00:00 1000 mL via INTRAVENOUS

## 2023-12-26 MED ADMIN — Gabapentin Cap 300 MG: 300 mg | ORAL | @ 21:00:00 | NDC 60687059111

## 2023-12-26 MED FILL — Gabapentin Cap 300 MG: 300.0000 mg | ORAL | Qty: 1 | Status: AC

## 2023-12-26 NOTE — ED Notes (Signed)
 IV removed at this time.  Unable to flush.  When catheter removed, IV catheter was noted to be bent.

## 2023-12-26 NOTE — Progress Notes (Signed)
°  Progress Note   Patient: Dylan [name] FMW:[id number] DOB: [birth year] DOA: 12/25/2023     0 DOS: the patient was seen and examined on 12/26/2023   Same day admission note:  58 y.o. male with medical history significant for HIV, schizoaffective disorder, polysubstance abuse including cocaine, recurrent rhabdomyolysis, prediabetes, presents to the ER due to a syncopal episode while getting out of the bus . TTE pending. Hemodynamically stable. Noncontrast head CT and cervical spine were unremarkable. On IVF because of elevated CPK levels. F/u CPK level in am   Author: Deliliah Room, MD 12/26/2023 1:09 PM  For on call review [email or web address].

## 2023-12-26 NOTE — ED Notes (Signed)
 First poc with patient. Patient asleep in bed.  IV access placed.  PT removed self from continuous monitoring devices, pt replaced on monitor and educated on the importance of staying on continuous monitoring devices including bp, o2 and cardiac monitoring.  Verbalized understanding.  Requested sandwich and gingerale.  PT made aware orders will need to be verified first.  Pt repositioned self in bed.  No respiratory distress noted. Will update patient's primary RN of patient needs and status

## 2023-12-26 NOTE — H&P (Addendum)
 History and Physical  [name] FMW:[id number] DOB: 10/14/1965 DOA: 12/25/2023  Referring physician: Dr. Trine, EDP  PCP: Delbert Clam, MD  Outpatient Specialists: Orthopedic surgery. Patient coming from: Home.  Chief Complaint: Passed out.  HPI: Dylan [name] is a 58 y.o. male with medical history significant for HIV, schizoaffective disorder, polysubstance abuse including cocaine, recurrent rhabdomyolysis, prediabetes, presents to the ER due to a syncopal episode while getting out of the bus.  Denies prior history of syncope.  Denies chest pain or palpitations.  Reportedly, the patient stood up to get off the bus, became very lightheaded, and fell to the ground.  Endorses dark-colored urine for the past few days.  No subjective fevers or chills.  EMS was activated.  The patient was brought into the ER for further evaluation.  In the ER, orthostatic vital signs were positive.  He admits to using cocaine 4 days ago.  Noncontrast head CT and cervical spine were nonacute.  CBC was elevated greater than 8000.  EDP requested admission for syncope workup and rhabdomyolysis.  Admitted by [facility], hospitalist service.  ED Course: Temperature 98.4.  BP 121/70, pulse 78, respiratory rate 16, O2 saturation 95% on room air.  Review of Systems: Review of systems as noted in the HPI. All other systems reviewed and are negative.   Past Medical History:  Diagnosis Date   Acute kidney failure    2019 from Rhabdomylosis   Anxiety    Arthritis    Bipolar 1 disorder (HCC)    Colon polyps    Depression    GERD (gastroesophageal reflux disease)    Hepatitis B    Human immunodeficiency virus (HIV) (HCC)    Hyperlipidemia    Insomnia due to other mental disorder 02/03/2020   Intentional drug overdose (HCC) 11/26/2021   Neuromuscular disorder (HCC)    neuropathy   Neuropathy    Pre-diabetes    Rhabdomyolysis 02/19/2017   Unilateral primary osteoarthritis, left knee 12/14/2021   Past Surgical  History:  Procedure Laterality Date   ANAL FISTULOTOMY  07/27/2023   Procedure: ANAL FISTULOTOMY;  Surgeon: Debby Hila, MD;  Location: [facility];  Service: General;;   COLONOSCOPY     FOOT ARTHRODESIS Right 09/17/2019   Procedure: FUSION RIGHT LISFRANC JOINT;  Surgeon: Harden Jerona GAILS, MD;  Location: [facility];  Service: Orthopedics;  Laterality: Right;   FOOT ARTHRODESIS Right 09/2019   HEMORROIDECTOMY     HIGH RESOLUTION ANOSCOPY N/A 07/27/2023   Procedure: ANOSCOPY, HIGH RESOLUTION;  Surgeon: Debby Hila, MD;  Location: [facility];  Service: General;  Laterality: N/A;   IR FLUORO GUIDE CV LINE RIGHT  02/22/2017   IR US  GUIDE VASC ACCESS RIGHT  02/22/2017   LASER ABLATION CONDOLAMATA N/A 10/25/2023   Procedure: ABLATION, CONDYLOMA, USING LASER;  Surgeon: Debby Hila, MD;  Location: [facility];  Service: General;  Laterality: N/A;  LASER ABLATION AND BIOPSY AIN   RECTAL BIOPSY N/A 07/27/2023   Procedure: BIOPSY AND ABLATION, RECTUM;  Surgeon: Debby Hila, MD;  Location: [facility];  Service: General;  Laterality: N/A;   TOTAL KNEE ARTHROPLASTY Left 12/14/2021   Procedure: LEFT TOTAL KNEE ARTHROPLASTY;  Surgeon: Harden Jerona GAILS, MD;  Location: [facility] OR;  Service: Orthopedics;  Laterality: Left;   TUMOR REMOVAL     From Chest    Social History:  reports that he has been smoking e-cigarettes and cigarettes. He has a 12.5 pack-year smoking history. He has never used smokeless tobacco. He reports  current alcohol use. He reports that he does not currently use drugs after having used the following drugs: Cocaine and Marijuana.   Allergies[1]  Family History  Problem Relation Age of Onset   CAD Mother    Diabetes Father    Stroke Father    Colon cancer Maternal Aunt 60   Diabetes Maternal Aunt    Heart disease Maternal Uncle    Heart attack Maternal Uncle    Esophageal cancer Neg Hx    Rectal cancer Neg Hx    Stomach cancer Neg Hx        Prior to Admission medications  Medication Sig Start Date End Date Taking? Authorizing Provider  bictegravir-emtricitabine -tenofovir  AF (BIKTARVY ) 50-200-25 MG TABS tablet Take 1 tablet by mouth daily. 07/23/23  Yes Luiz Channel, MD  gabapentin  (NEURONTIN ) 300 MG capsule Take 2 capsules (600 mg total) by mouth 2 (two) times daily. 07/07/23  Yes Newlin, Enobong, MD  pantoprazole  (PROTONIX ) 40 MG tablet Take 1 tablet (40 mg total) by mouth daily. 11/11/23  Yes Jillian Buttery, MD  QUEtiapine  Fumarate 150 MG TABS Take 150 mg by mouth daily as needed. 07/17/23  Yes [provider]  traZODone  (DESYREL ) 150 MG tablet Take 150 mg by mouth at bedtime as needed for sleep.   Yes [provider]  cyclobenzaprine  (FLEXERIL ) 5 MG tablet Take 1 tablet (5 mg total) by mouth 2 (two) times daily as needed for muscle spasms. Patient not taking: Reported on 12/26/2023 11/29/23   Vicci Barnie NOVAK, MD  diclofenac  Sodium (VOLTAREN ) 1 % GEL Apply 4 g topically 4 (four) times daily. Patient not taking: Reported on 12/26/2023 06/19/23   Newlin, Enobong, MD  hydrOXYzine  (VISTARIL ) 25 MG capsule Take 25 mg by mouth daily as needed for anxiety. Patient not taking: Reported on 12/26/2023 07/17/23   [provider]  lidocaine  (XYLOCAINE ) 5 % ointment Apply 1 Application topically as needed. Patient not taking: Reported on 12/26/2023 10/25/23   Thomas, Alicia, MD  Multiple Vitamin (MULTIVITAMIN WITH MINERALS) TABS tablet Take 1 tablet by mouth daily. Patient not taking: Reported on 11/29/2023    [provider]  nicotine  (NICODERM CQ  - DOSED IN MG/24 HOURS) 21 mg/24hr patch Place 1 patch (21 mg total) onto the skin daily as needed (Nicotine  withdrawal symptoms.). Patient not taking: Reported on 12/26/2023 11/10/23   Jillian Buttery, MD  ondansetron  (ZOFRAN -ODT) 4 MG disintegrating tablet 4mg  ODT q4 hours prn nausea/vomit 11/14/23   Pollina, Lonni PARAS, MD  predniSONE  (DELTASONE ) 20 MG  tablet 1 tab PO daily x 3 days then 1/2 tab PO daily x 4 days 11/29/23   Vicci Barnie NOVAK, MD  traMADol  (ULTRAM ) 50 MG tablet Take 1-2 tablets (50-100 mg total) by mouth every 6 (six) hours as needed. Patient not taking: No sig reported 10/25/23   Debby Hila, MD    Physical Exam: BP 122/81   Pulse 100   Temp 98.2 F (36.8 C) (Oral)   Resp 18   SpO2 97%   General: 58 y.o. year-old male well developed well nourished in no acute distress.  Alert and oriented x3. Cardiovascular: Regular rate and rhythm with no rubs or gallops.  No thyromegaly or JVD noted.  No lower extremity edema. 2/4 pulses in all 4 extremities. Respiratory: Clear to auscultation with no wheezes or rales. Good inspiratory effort. Abdomen: Soft nontender nondistended with normal bowel sounds x4 quadrants. Muskuloskeletal: No cyanosis, clubbing or edema noted bilaterally Neuro: CN II-XII intact, strength, sensation, reflexes Skin: No ulcerative lesions  noted or rashes Psychiatry: Judgement and insight appear normal. Mood is appropriate for condition and setting          Labs on Admission:  Basic Metabolic Panel: Recent Labs  Lab 12/21/23 0326 12/26/23 0026  NA 132* 137  K 4.1 3.9  CL 102 101  CO2 22 24  GLUCOSE 106* 96  BUN 17 24*  CREATININE 0.92 0.91  CALCIUM  8.9 10.0   Liver Function Tests: Recent Labs  Lab 12/26/23 0026  AST 190*  ALT 77*  ALKPHOS 109  BILITOT 0.6  PROT 8.1  ALBUMIN 4.5   No results for input(s): LIPASE, AMYLASE in the last 168 hours. No results for input(s): AMMONIA in the last 168 hours. CBC: Recent Labs  Lab 12/21/23 0326 12/26/23 0026  WBC 9.2 8.4  NEUTROABS  --  4.6  HGB 14.7 14.9  HCT 43.2 44.6  MCV 90.9 91.2  PLT 212 238   Cardiac Enzymes: Recent Labs  Lab 12/26/23 0026  CKTOTAL 8,443*    BNP (last 3 results) No results for input(s): BNP in the last 8760 hours.  ProBNP (last 3 results) No results for input(s): PROBNP in the last 8760  hours.  CBG: Recent Labs  Lab 12/26/23 0019  GLUCAP 93    Radiological Exams on Admission: CT Cervical Spine Wo Contrast Result Date: 12/26/2023 EXAM: CT CERVICAL SPINE WITHOUT CONTRAST 12/26/2023 02:33:55 AM TECHNIQUE: CT of the cervical spine was performed without the administration of intravenous contrast. Multiplanar reformatted images are provided for review. Automated exposure control, iterative reconstruction, and/or weight based adjustment of the mA/kV was utilized to reduce the radiation dose to as low as reasonably achievable. COMPARISON: None available. CLINICAL HISTORY: Recent fall with neck pain, additional encounter. FINDINGS: BONES AND ALIGNMENT: 7 cervical segments are well visualized. Vertebral body height is well maintained. No acute fracture or acute facet abnormality is noted. DEGENERATIVE CHANGES: Mild osteophytic changes are seen. Mild facet hypertrophic changes are noted. SOFT TISSUES: Surrounding soft tissue structures are unremarkable. LUNG APICES: Visualized lung apices are within normal limits. IMPRESSION: 1. No acute cervical spine fracture or acute facet abnormality. 2. Mild cervical spondylosis and facet hypertrophy. Electronically signed by: Oneil Devonshire MD 12/26/2023 02:40 AM EST RP Workstation: [id number]   CT Head Wo Contrast Result Date: 12/26/2023 EXAM: CT HEAD WITHOUT CONTRAST 12/26/2023 02:33:55 AM TECHNIQUE: CT of the head was performed without the administration of intravenous contrast. Automated exposure control, iterative reconstruction, and/or weight based adjustment of the mA/kV was utilized to reduce the radiation dose to as low as reasonably achievable. COMPARISON: 07/11/2023 CLINICAL HISTORY: Head trauma, moderate-severe. FINDINGS: BRAIN AND VENTRICLES: No acute hemorrhage. No evidence of acute infarct. No extra-axial collection. No mass effect or midline shift. ORBITS: Old left medial orbital wall fracture. SINUSES: No acute abnormality. SOFT TISSUES AND  SKULL: No acute soft tissue abnormality. No skull fracture. IMPRESSION: 1. No acute intracranial abnormality. Electronically signed by: Oneil Devonshire MD 12/26/2023 02:39 AM EST RP Workstation: [id number]    EKG: I independently viewed the EKG done and my findings are as followed: Sinus rhythm rate of 80.  QTc 473.  Assessment/Plan Present on Admission:  Syncope  Principal Problem:   Syncope  Syncope, high risk, in the setting of cocaine use Follow orthostatic vital signs Follow-up transthoracic echocardiogram Monitor on telemetry Continue IV fluid hydration.  Rhabdomyolysis in the setting of cocaine use Presented with CPK of greater than 8000 Continue IV fluid hydration Repeat CPK in the morning.  Persistent abuse including cocaine  TOC consulted to provide resources for complete cessation  HIV Resume home regimen.  Elevated liver chemistries AST 190, ALT 77 Avoid hepatotoxic agents Repeat CMP in the morning  Schizoaffective disorder Resume home regimen  Prediabetes Blood sugars stable. Last hemoglobin A1c 5.9 in November 2024.  Polyneuropathy Resume home gabapentin .   Time: 75 minutes.    DVT prophylaxis: SCDs.  Code Status: Full code.  Family Communication: None at bedside.  Disposition Plan: Admitted to telemetry unit.  Consults called: None.  Admission status: Observation status.   Status is: Observation    Terry LOISE Hurst MD [facility] Pager [phone]  If 7PM-7AM, please contact night-coverage [email or web address] Password [email or web address]  12/26/2023, 2:59 AM      [1]  Allergies Allergen Reactions   Penicillins Other (See Comments)    Reaction type/severity unknown, childhood allergy; pt cannot recall all symptoms   Latex Rash   Tape Rash

## 2023-12-27 DIAGNOSIS — R55 Syncope and collapse: Secondary | ICD-10-CM | POA: Diagnosis not present

## 2023-12-27 LAB — COMPREHENSIVE METABOLIC PANEL WITH GFR
ALT: 56 U/L — ABNORMAL HIGH (ref 0–44)
AST: 91 U/L — ABNORMAL HIGH (ref 15–41)
Albumin: 3.6 g/dL (ref 3.5–5.0)
Alkaline Phosphatase: 81 U/L (ref 38–126)
Anion gap: 8 (ref 5–15)
BUN: 18 mg/dL (ref 6–20)
CO2: 25 mmol/L (ref 22–32)
Calcium: 8.8 mg/dL — ABNORMAL LOW (ref 8.9–10.3)
Chloride: 106 mmol/L (ref 98–111)
Creatinine, Ser: 0.82 mg/dL (ref 0.61–1.24)
GFR, Estimated: >60 mL/min (ref >60)
Glucose, Bld: 99 mg/dL (ref 70–99)
Potassium: 4 mmol/L (ref 3.5–5.1)
Sodium: 138 mmol/L (ref 135–145)
Total Bilirubin: 0.3 mg/dL (ref 0.0–1.2)
Total Protein: 6.2 g/dL — ABNORMAL LOW (ref 6.5–8.1)

## 2023-12-27 LAB — CK: Total CK: 3014 U/L — ABNORMAL HIGH (ref 49–397)

## 2023-12-27 MED ADMIN — Gabapentin Cap 300 MG: 300 mg | ORAL | @ 09:00:00 | NDC 60687059111

## 2023-12-27 NOTE — TOC Initial Note (Signed)
 Transition of Care [facility]) - Initial/Assessment Note   Patient Details  Name: Dylan [name] MRN: [id number] Date of Birth: [birth year]  Transition of Care [facility]) CM/SW Contact:    Duwaine GORMAN Aran, LCSW Phone Number: 12/27/2023, 10:59 AM  Clinical Narrative: Care management consulted for substance use resources. Patient agreeable to having resources added to discharge paperwork. CSW added resources to AVS.  Expected Discharge Plan: Home/Self Care Barriers to Discharge: Continued Medical Work up  Patient Goals and CMS Choice Patient states their goals for this hospitalization and ongoing recovery are:: Get resources Choice offered to / list presented to : NA  Expected Discharge Plan and Services In-house Referral: Clinical Social Work Post Acute Care Choice: NA Living arrangements for the past 2 months: Single Family Home Expected Discharge Date: 12/27/23               DME Arranged: N/A DME Agency: NA  Prior Living Arrangements/Services Living arrangements for the past 2 months: Single Family Home Lives with:: Friends Patient language and need for interpreter reviewed:: Yes Do you feel safe going back to the place where you live?: Yes      Need for Family Participation in Patient Care: No (Comment) Care giver support system in place?: Yes (comment) Criminal Activity/Legal Involvement Pertinent to Current Situation/Hospitalization: No - Comment as needed  Activities of Daily Living ADL Screening (condition at time of admission) Independently performs ADLs?: Yes (appropriate for developmental age) Is the patient deaf or have difficulty hearing?: Yes Does the patient have difficulty seeing, even when wearing glasses/contacts?: No Does the patient have difficulty concentrating, remembering, or making decisions?: No  Emotional Assessment Attitude/Demeanor/Rapport: Engaged Affect (typically observed): Accepting Orientation: : Oriented to Self, Oriented to Place, Oriented to  Time,  Oriented to Situation Alcohol / Substance Use: Illicit Drugs Psych Involvement: No (comment)  Admission diagnosis:  Syncope [R55] Non-traumatic rhabdomyolysis [M62.82] Patient Active Problem List   Diagnosis Date Noted   Syncope 12/26/2023   Cocaine abuse (HCC) 11/07/2023   MDD (major depressive disorder), recurrent, severe, with psychosis (HCC) 03/12/2023   Transaminitis 01/04/2023   Bipolar 1 disorder, depressed (HCC) 12/05/2022   Non-traumatic rhabdomyolysis 12/04/2022   Weakness 11/03/2022   Cough 11/03/2022   Essential hypertension 11/03/2022   Peripheral neuropathy 11/03/2022   Bipolar disorder (HCC) 08/27/2022   Cannabis use disorder 08/27/2022   Bipolar disorder, most recent episode depressed (HCC) 08/26/2022   Suicidal ideation 08/26/2022   Rhabdomyolysis 07/01/2022   Non-ST elevated myocardial infarction (HCC) 06/03/2022   Dyslipidemia 06/02/2022   Polysubstance abuse (HCC) 06/02/2022   Obesity (BMI 30-39.9) 06/02/2022   Alcohol use disorder 04/12/2022   PTSD (post-traumatic stress disorder) 04/12/2022   S/P total knee arthroplasty, left 12/14/2021   Total knee replacement status, left 12/14/2021   Chronic fatigue 11/16/2021   Abnormal EKG 11/16/2021   Overweight (BMI 25.0-29.9) 11/16/2021   Elevated LFTs 07/03/2021   Right foot ulcer (HCC) 07/03/2021   Onychomycosis 04/19/2021   Stimulant use disorder 03/16/2021   Nodular radiologic density 02/02/2021   Lesion of liver 02/02/2021   Liver disease 02/02/2021   Vitamin D  deficiency 01/18/2021   Methamphetamine abuse (HCC) 01/17/2021   Lesion of pancreas 01/17/2021   Substance induced mood disorder (HCC) 10/26/2020   Fatty liver 03/09/2020   Generalized anxiety disorder 02/03/2020   Lisfranc dislocation, right, initial encounter    Prediabetes 07/22/2019   Tobacco use disorder 07/10/2018   AKI (acute kidney injury)    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 [place] [address] Phone: [phone] Fax: [phone]  [id number] - [place], [place] - [address] AT [facility] [address] [place] [address] Phone: [phone] Fax: [phone]  Social Drivers of Health (SDOH) Social History: SDOH Screenings   Food Insecurity: No Food Insecurity (12/26/2023)  Recent Concern: Food Insecurity - Food Insecurity Present (11/07/2023)  Housing: Low Risk (12/26/2023)  Transportation Needs: No Transportation Needs (12/26/2023)  Recent Concern: Transportation Needs - Unmet Transportation Needs (11/07/2023)  Utilities: Not At Risk (12/26/2023)  Alcohol Screen: Low Risk (03/10/2023)  Depression (PHQ2-9): Medium Risk (11/29/2023)  Financial Resource Strain: Patient Declined (11/29/2023)  Physical Activity: Insufficiently Active (11/29/2023)  Social Connections: Patient Declined (12/26/2023)  Stress: Stress Concern Present (11/29/2023)  Tobacco Use: High Risk (12/25/2023)   SDOH Interventions:    Readmission Risk Interventions    06/05/2023    3:36 PM 01/08/2023   12:34 PM  Readmission Risk Prevention Plan  Transportation Screening Complete   Medication Review (RN Care Manager) Complete   PCP or Specialist appointment within 3-5 days of discharge Complete   HRI or Home Care Consult Complete   SW Recovery Care/Counseling Consult    Palliative Care Screening Not Applicable   Skilled Nursing Facility Not Applicable      Information is confidential and restricted. Go to Review Flowsheets to unlock data.

## 2023-12-27 NOTE — Progress Notes (Signed)
 Discharge instructions reviewed with patient. All questions answered. All belongings accounted for. Patient to follow up with PCP in 1 week. PIV removed. Primary RN assisting with transport set up.

## 2023-12-27 NOTE — Discharge Summary (Addendum)
 Physician Discharge Summary   Patient: Dylan [name] MRN: [id number] DOB: 02-15-1965  Admit date:     12/25/2023  Discharge date: 12/27/2023  Discharge Physician: Deliliah Room   PCP: Delbert Clam, MD   Recommendations at discharge:    F/u with your PCP in one week. Stop using any illicit drugs Stay hydrated  Discharge Diagnoses: Principal Problem:   Syncope   Hospital Course:  58 y.o. male with medical history significant for HIV, schizoaffective disorder, polysubstance abuse including cocaine, recurrent rhabdomyolysis, prediabetes, presents to the ER due to a syncopal episode while getting out of the bus . TTE is unremarkable. Hemodynamically stable. Likely orthostatic hypotension. Noncontrast head CT and cervical spine were unremarkable. Received IVF because of elevated CPK levels. CPK level down to 3014 on the day of the discharge Patient was AAO x 3 at the time of the discharge. Advised to stop using cocaine and f/u with PCP in one week.      Consultants: None Procedures performed: None  Disposition: Home Diet recommendation:  Regular diet DISCHARGE MEDICATION: Allergies as of 12/27/2023       Reactions   Penicillins Other (See Comments)   Reaction type/severity unknown, childhood allergy; pt cannot recall all symptoms   Latex Rash   Tape Rash        Medication List     STOP taking these medications    hydrOXYzine  25 MG capsule Commonly known as: VISTARIL    nicotine  21 mg/24hr patch Commonly known as: NICODERM CQ  - dosed in mg/24 hours   predniSONE  20 MG tablet Commonly known as: DELTASONE    traMADol  50 MG tablet Commonly known as: ULTRAM        TAKE these medications    bictegravir-emtricitabine -tenofovir  AF 50-200-25 MG Tabs tablet Commonly known as: BIKTARVY  Take 1 tablet by mouth daily.   cyclobenzaprine  5 MG tablet Commonly known as: FLEXERIL  Take 1 tablet (5 mg total) by mouth 2 (two) times daily as needed for muscle spasms.    diclofenac  Sodium 1 % Gel Commonly known as: Voltaren  Apply 4 g topically 4 (four) times daily.   gabapentin  300 MG capsule Commonly known as: NEURONTIN  Take 2 capsules (600 mg total) by mouth 2 (two) times daily.   lidocaine  5 % ointment Commonly known as: XYLOCAINE  Apply 1 Application topically as needed.   multivitamin with minerals Tabs tablet Take 1 tablet by mouth daily.   ondansetron  4 MG disintegrating tablet Commonly known as: ZOFRAN -ODT 4mg  ODT q4 hours prn nausea/vomit   pantoprazole  40 MG tablet Commonly known as: PROTONIX  Take 1 tablet (40 mg total) by mouth daily.   QUEtiapine  Fumarate 150 MG Tabs Take 150 mg by mouth daily as needed.   traZODone  150 MG tablet Commonly known as: DESYREL  Take 150 mg by mouth at bedtime as needed for sleep.        Follow-up Information     Delbert Clam, MD. Schedule an appointment as soon as possible for a visit in 1 week(s).   Specialty: Family Medicine Contact information: [address] [place] [address] [place] [address] [phone]                Discharge Exam: There were no vitals filed for this visit. Constitutional: NAD, calm, comfortable Eyes: PERRL, lids and conjunctivae normal ENMT: Mucous membranes are moist. Posterior pharynx clear of any exudate or lesions.Normal dentition.  Neck: normal, supple, no masses, no thyromegaly Respiratory: clear to auscultation bilaterally, no wheezing, no crackles. Normal respiratory effort. No accessory muscle use.  Cardiovascular: Regular rate and rhythm, no murmurs / rubs / gallops. No extremity edema. 2+ pedal pulses. No carotid bruits.  Abdomen: no tenderness, no masses palpated. No hepatosplenomegaly. Bowel sounds positive.  Musculoskeletal: no clubbing / cyanosis. No joint deformity upper and lower extremities. Good ROM, no contractures. Normal muscle tone.  Skin: no rashes, lesions, ulcers. No induration Neurologic: CN 2-12 grossly intact. Sensation  intact, DTR normal. Strength 5/5 x all 4 extremities.  Psychiatric: Normal judgment and insight. Alert and oriented x 3. Normal mood.    Condition at discharge: good  The results of significant diagnostics from this hospitalization (including imaging, microbiology, ancillary and laboratory) are listed below for reference.   Imaging Studies: ECHOCARDIOGRAM COMPLETE Result Date: 12/26/2023    ECHOCARDIOGRAM REPORT   Patient Name:   Dylan [name] Date of Exam: 12/26/2023 Medical Rec #:  [id number]     Height:       74.0 in Accession #:    [id number]    Weight:       235.0 lb Date of Birth:  Sep 20, 1965     BSA:          2.328 m Patient Age:    58 years      BP:           121/76 mmHg Patient Gender: M             HR:           60 bpm. Exam Location:  Inpatient Procedure: 2D Echo, Cardiac Doppler and Color Doppler (Both Spectral and Color            Flow Doppler were utilized during procedure). Indications:    Syncope  History:        Patient has prior history of Echocardiogram examinations, most                 recent 06/02/2022.  Sonographer:    Philomena Daring Referring Phys: [id number] CAROLE N HALL IMPRESSIONS  1. Left ventricular ejection fraction, by estimation, is 60 to 65%. The left ventricle has normal function. The left ventricle has no regional wall motion abnormalities. There is mild concentric left ventricular hypertrophy. Left ventricular diastolic parameters were normal.  2. Right ventricular systolic function is normal. The right ventricular size is normal.  3. The mitral valve is normal in structure. Trivial mitral valve regurgitation. No evidence of mitral stenosis.  4. The aortic valve is tricuspid. There is mild calcification of the aortic valve. Aortic valve regurgitation is not visualized. No aortic stenosis is present.  5. The inferior vena cava is normal in size with greater than 50% respiratory variability, suggesting right atrial pressure of 3 mmHg. FINDINGS  Left Ventricle: Left ventricular  ejection fraction, by estimation, is 60 to 65%. The left ventricle has normal function. The left ventricle has no regional wall motion abnormalities. The left ventricular internal cavity size was normal in size. There is  mild concentric left ventricular hypertrophy. Left ventricular diastolic parameters were normal. Right Ventricle: The right ventricular size is normal. No increase in right ventricular wall thickness. Right ventricular systolic function is normal. Left Atrium: Left atrial size was normal in size. Right Atrium: Right atrial size was normal in size. Pericardium: There is no evidence of pericardial effusion. Mitral Valve: The mitral valve is normal in structure. Trivial mitral valve regurgitation. No evidence of mitral valve stenosis. Tricuspid Valve: The tricuspid valve is normal in structure. Tricuspid valve regurgitation is trivial. No evidence of tricuspid stenosis. Aortic  Valve: The aortic valve is tricuspid. There is mild calcification of the aortic valve. Aortic valve regurgitation is not visualized. No aortic stenosis is present. Pulmonic Valve: The pulmonic valve was normal in structure. Pulmonic valve regurgitation is trivial. No evidence of pulmonic stenosis. Aorta: The aortic root is normal in size and structure. Venous: The inferior vena cava is normal in size with greater than 50% respiratory variability, suggesting right atrial pressure of 3 mmHg. IAS/Shunts: No atrial level shunt detected by color flow Doppler.  LEFT VENTRICLE PLAX 2D LVIDd:         5.10 cm   Diastology LVIDs:         3.40 cm   LV e' medial:    7.94 cm/s LV PW:         1.20 cm   LV E/e' medial:  6.9 LV IVS:        0.90 cm   LV e' lateral:   8.59 cm/s LVOT diam:     2.10 cm   LV E/e' lateral: 6.4 LV SV:         61 LV SV Index:   26 LVOT Area:     3.46 cm  RIGHT VENTRICLE             IVC RV Basal diam:  3.40 cm     IVC diam: 1.70 cm RV S prime:     10.13 cm/s TAPSE (M-mode): 1.9 cm LEFT ATRIUM             Index         RIGHT ATRIUM           Index LA diam:        3.40 cm 1.46 cm/m   RA Area:     17.10 cm LA Vol (A2C):   51.6 ml 22.17 ml/m  RA Volume:   43.00 ml  18.47 ml/m LA Vol (A4C):   54.1 ml 23.24 ml/m LA Biplane Vol: 53.7 ml 23.07 ml/m  AORTIC VALVE LVOT Vmax:   87.40 cm/s LVOT Vmean:  56.100 cm/s LVOT VTI:    0.175 m  AORTA Ao Root diam: 2.80 cm Ao Asc diam:  3.20 cm MITRAL VALVE               TRICUSPID VALVE MV Area (PHT): 3.56 cm    TR Peak grad:   16.6 mmHg MV Decel Time: 213 msec    TR Vmax:        204.00 cm/s MV E velocity: 54.80 cm/s MV A velocity: 66.80 cm/s  SHUNTS MV E/A ratio:  0.82        Systemic VTI:  0.18 m                            Systemic Diam: 2.10 cm Toribio Fuel MD Electronically signed by Toribio Fuel MD Signature Date/Time: 12/26/2023/7:54:04 PM    Final    CT Cervical Spine Wo Contrast Result Date: 12/26/2023 EXAM: CT CERVICAL SPINE WITHOUT CONTRAST 12/26/2023 02:33:55 AM TECHNIQUE: CT of the cervical spine was performed without the administration of intravenous contrast. Multiplanar reformatted images are provided for review. Automated exposure control, iterative reconstruction, and/or weight based adjustment of the mA/kV was utilized to reduce the radiation dose to as low as reasonably achievable. COMPARISON: None available. CLINICAL HISTORY: Recent fall with neck pain, additional encounter. FINDINGS: BONES AND ALIGNMENT: 7 cervical segments are well visualized. Vertebral body height is well maintained. No acute  fracture or acute facet abnormality is noted. DEGENERATIVE CHANGES: Mild osteophytic changes are seen. Mild facet hypertrophic changes are noted. SOFT TISSUES: Surrounding soft tissue structures are unremarkable. LUNG APICES: Visualized lung apices are within normal limits. IMPRESSION: 1. No acute cervical spine fracture or acute facet abnormality. 2. Mild cervical spondylosis and facet hypertrophy. Electronically signed by: Oneil Devonshire MD 12/26/2023 02:40 AM EST RP  Workstation: [id number]   CT Head Wo Contrast Result Date: 12/26/2023 EXAM: CT HEAD WITHOUT CONTRAST 12/26/2023 02:33:55 AM TECHNIQUE: CT of the head was performed without the administration of intravenous contrast. Automated exposure control, iterative reconstruction, and/or weight based adjustment of the mA/kV was utilized to reduce the radiation dose to as low as reasonably achievable. COMPARISON: 07/11/2023 CLINICAL HISTORY: Head trauma, moderate-severe. FINDINGS: BRAIN AND VENTRICLES: No acute hemorrhage. No evidence of acute infarct. No extra-axial collection. No mass effect or midline shift. ORBITS: Old left medial orbital wall fracture. SINUSES: No acute abnormality. SOFT TISSUES AND SKULL: No acute soft tissue abnormality. No skull fracture. IMPRESSION: 1. No acute intracranial abnormality. Electronically signed by: Oneil Devonshire MD 12/26/2023 02:39 AM EST RP Workstation: [id number]   DG Chest 2 View Result Date: 12/21/2023 EXAM: 2 VIEW(S) XRAY OF THE CHEST 12/21/2023 03:42:00 AM COMPARISON: 08/07/2023 CLINICAL HISTORY: chest pain FINDINGS: LUNGS AND PLEURA: No focal pulmonary opacity. No pleural effusion. No pneumothorax. HEART AND MEDIASTINUM: No acute abnormality of the cardiac and mediastinal silhouettes. BONES AND SOFT TISSUES: No acute osseous abnormality. IMPRESSION: 1. No acute cardiopulmonary process. Electronically signed by: Kate Plummer MD 12/21/2023 03:45 AM EST RP Workstation: [id number]    Microbiology: Results for orders placed or performed during the hospital encounter of 11/07/23  Gastrointestinal Panel by PCR , Stool     Status: Abnormal   Collection Time: 11/08/23  9:57 AM   Specimen: Stool  Result Value Ref Range Status   Campylobacter species NOT DETECTED NOT DETECTED Final   Plesimonas shigelloides NOT DETECTED NOT DETECTED Final   Salmonella species NOT DETECTED NOT DETECTED Final   Yersinia enterocolitica NOT DETECTED NOT DETECTED Final   Vibrio species NOT  DETECTED NOT DETECTED Final   Vibrio cholerae NOT DETECTED NOT DETECTED Final   Enteroaggregative E coli (EAEC) NOT DETECTED NOT DETECTED Final   Enteropathogenic E coli (EPEC) NOT DETECTED NOT DETECTED Final   Enterotoxigenic E coli (ETEC) NOT DETECTED NOT DETECTED Final   Shiga like toxin producing E coli (STEC) NOT DETECTED NOT DETECTED Final   Shigella/Enteroinvasive E coli (EIEC) NOT DETECTED NOT DETECTED Final   Cryptosporidium NOT DETECTED NOT DETECTED Final   Cyclospora cayetanensis NOT DETECTED NOT DETECTED Final   Entamoeba histolytica NOT DETECTED NOT DETECTED Final   Giardia lamblia NOT DETECTED NOT DETECTED Final   Adenovirus F40/41 NOT DETECTED NOT DETECTED Final   Astrovirus NOT DETECTED NOT DETECTED Final   Norovirus GI/GII NOT DETECTED NOT DETECTED Final   Rotavirus A NOT DETECTED NOT DETECTED Final   Sapovirus (I, II, IV, and V) DETECTED (A) NOT DETECTED Final    Comment: Performed at [facility], [address] [place], [place] [address]    Labs: CBC: Recent Labs  Lab 12/21/23 0326 12/26/23 0026 12/26/23 0547  WBC 9.2 8.4 6.0  NEUTROABS  --  4.6  --   HGB 14.7 14.9 14.1  HCT 43.2 44.6 42.8  MCV 90.9 91.2 91.8  PLT 212 238 220   Basic Metabolic Panel: Recent Labs  Lab 12/21/23 0326 12/26/23 0026 12/26/23 0547 12/27/23 0356  NA 132* 137  137 138  K 4.1 3.9 3.9 4.0  CL 102 101 104 106  CO2 22 24 22 25   GLUCOSE 106* 96 106* 99  BUN 17 24* 23* 18  CREATININE 0.92 0.91 1.04 0.82  CALCIUM  8.9 10.0 9.3 8.8*  MG  --   --  2.6*  --   PHOS  --   --  3.5  --    Liver Function Tests: Recent Labs  Lab 12/26/23 0026 12/26/23 0547 12/27/23 0356  AST 190* 152* 91*  ALT 77* 65* 56*  ALKPHOS 109 94 81  BILITOT 0.6 0.5 0.3  PROT 8.1 7.2 6.2*  ALBUMIN 4.5 4.0 3.6   CBG: Recent Labs  Lab 12/26/23 0019  GLUCAP 93    Discharge time spent: 40 minutes.  Signed: Deliliah Room, MD [facility] 12/27/2023

## 2023-12-27 NOTE — Plan of Care (Signed)

## 2024-01-02 ENCOUNTER — Telehealth: Payer: Self-pay

## 2024-01-02 NOTE — Transitions of Care (Post Inpatient/ED Visit) (Signed)
" ° °  01/02/2024  Name: Dylan [name] MRN: [id number] DOB: 1965-05-18  Today's TOC FU Call Status: Today's TOC FU Call Status:: Unsuccessful Call (1st Attempt) Unsuccessful Call (1st Attempt) Date: 01/02/24  Attempted to reach the patient regarding the most recent Inpatient/ED visit.  Follow Up Plan: Additional outreach attempts will be made to reach the patient to complete the Transitions of Care (Post Inpatient/ED visit) call.   The recording stated that the call cannot be completed at this time   Signature  Slater Diesel, RN   "

## 2024-01-04 ENCOUNTER — Telehealth: Payer: Self-pay

## 2024-01-04 NOTE — Transitions of Care (Post Inpatient/ED Visit) (Signed)
" ° °  01/04/2024  Name: Dylan [name] MRN: [id number] DOB: [birth year]  Today's TOC FU Call Status: Today's TOC FU Call Status:: Unsuccessful Call (2nd Attempt) Unsuccessful Call (1st Attempt) Date: 01/02/24 Unsuccessful Call (2nd Attempt) Date: 01/04/24  Attempted to reach the patient regarding the most recent Inpatient/ED visit.  Follow Up Plan: Additional outreach attempts will be made to reach the patient to complete the Transitions of Care (Post Inpatient/ED visit) call.   I called: [phone] and the recording stated that the call cannot be completed at this time   Signature Slater Diesel, RN   "

## 2024-01-04 NOTE — Transitions of Care (Post Inpatient/ED Visit) (Signed)
" ° °  01/04/2024  Name: Dylan [name] MRN: [id number] DOB: 29-Aug-1965  Today's TOC FU Call Status: Today's TOC FU Call Status:: Unsuccessful Call (3rd Attempt) Unsuccessful Call (1st Attempt) Date: 01/02/24 Unsuccessful Call (2nd Attempt) Date: 01/04/24 Unsuccessful Call (3rd Attempt) Date: 01/04/24  Attempted to reach the patient regarding the most recent Inpatient/ED visit.  Follow Up Plan: No further outreach attempts will be made at this time. We have been unable to contact the patient.  The recording stated that the call cannot be completed at this time.  Letter sent to patient requesting he contact [facility] to schedule a follow up appointment as we have not been able to reach him.   Signature Slater Diesel, RN   "

## 2024-01-09 ENCOUNTER — Emergency Department (HOSPITAL_COMMUNITY)
Admission: EM | Admit: 2024-01-09 | Discharge: 2024-01-09 | Disposition: A | Discharge status: Home / Self Care | Payer: MEDICAID | Source: Home / Self Care | Attending: Emergency Medicine | Admitting: Emergency Medicine

## 2024-01-09 ENCOUNTER — Encounter (HOSPITAL_COMMUNITY): Payer: Self-pay

## 2024-01-09 ENCOUNTER — Other Ambulatory Visit: Payer: Self-pay

## 2024-01-09 ENCOUNTER — Emergency Department (HOSPITAL_COMMUNITY): Payer: MEDICAID

## 2024-01-09 DIAGNOSIS — R748 Abnormal levels of other serum enzymes: Secondary | ICD-10-CM | POA: Insufficient documentation

## 2024-01-09 DIAGNOSIS — R079 Chest pain, unspecified: Secondary | ICD-10-CM | POA: Insufficient documentation

## 2024-01-09 DIAGNOSIS — R0602 Shortness of breath: Secondary | ICD-10-CM | POA: Insufficient documentation

## 2024-01-09 DIAGNOSIS — Z91040 Latex allergy status: Secondary | ICD-10-CM | POA: Diagnosis not present

## 2024-01-09 DIAGNOSIS — Z21 Asymptomatic human immunodeficiency virus [HIV] infection status: Secondary | ICD-10-CM | POA: Diagnosis not present

## 2024-01-09 DIAGNOSIS — Z79899 Other long term (current) drug therapy: Secondary | ICD-10-CM | POA: Diagnosis not present

## 2024-01-09 LAB — TROPONIN T, HIGH SENSITIVITY
Troponin T High Sensitivity: 26 ng/L — ABNORMAL HIGH (ref 0–19)
Troponin T High Sensitivity: 28 ng/L — ABNORMAL HIGH (ref 0–19)

## 2024-01-09 LAB — BASIC METABOLIC PANEL WITH GFR
Anion gap: 13 (ref 5–15)
BUN: 15 mg/dL (ref 6–20)
CO2: 22 mmol/L (ref 22–32)
Calcium: 9.6 mg/dL (ref 8.9–10.3)
Chloride: 101 mmol/L (ref 98–111)
Creatinine, Ser: 0.98 mg/dL (ref 0.61–1.24)
GFR, Estimated: >60 mL/min
Glucose, Bld: 94 mg/dL (ref 70–99)
Potassium: 3.9 mmol/L (ref 3.5–5.1)
Sodium: 136 mmol/L (ref 135–145)

## 2024-01-09 LAB — CBC
HCT: 44.5 % (ref 39.0–52.0)
Hemoglobin: 14.9 g/dL (ref 13.0–17.0)
MCH: 30.4 pg (ref 26.0–34.0)
MCHC: 33.5 g/dL (ref 30.0–36.0)
MCV: 90.8 fL (ref 80.0–100.0)
Platelets: 214 K/uL (ref 150–400)
RBC: 4.9 MIL/uL (ref 4.22–5.81)
RDW: 13.1 % (ref 11.5–15.5)
WBC: 10.2 K/uL (ref 4.0–10.5)
nRBC: 0 % (ref 0.0–0.2)

## 2024-01-09 LAB — CK: Total CK: 919 U/L — ABNORMAL HIGH (ref 49–397)

## 2024-01-09 MED ORDER — SODIUM CHLORIDE 0.9 % IV BOLUS
1000.0000 mL | Freq: Once | INTRAVENOUS | Status: AC
  Administered 2024-01-09: 1000 mL via INTRAVENOUS

## 2024-01-09 NOTE — ED Provider Notes (Signed)
 " [facility] Provider Note   CSN: [id number] Arrival date & time: 01/09/24  0301     Patient presents with: Chest Pain   Dylan [name] is a 58 y.o. male with history of drug use, acute kidney failure, rhabdomyolysis, bipolar 1 disorder, depression, GERD, hepatitis B, HIV, neuropathy, methamphetamine abuse.  Presents to ED complaining of chest pain.  States that he noted chest pain 1 hour prior to arrival.  Reports he just got off work, works as a investment banker, operational.  Endorsing some mild shortness of breath.  Denies any nausea, vomiting, abdominal pain, fevers, leg swelling, lightheadedness, dizziness, weakness.  Patient with history of acute nontraumatic rhabdomyolysis which has been attributed to cocaine use, he denies any cocaine use, reports he is urinating normally.  Denies any dark urine.  Denies any flank pain.  Denies any muscle cramping.  Denies blood thinners.  Denies history of MI.   Chest Pain      Prior to Admission medications  Medication Sig Start Date End Date Taking? Authorizing Provider  bictegravir-emtricitabine -tenofovir  AF (BIKTARVY ) 50-200-25 MG TABS tablet Take 1 tablet by mouth daily. 07/23/23   Luiz Channel, MD  cyclobenzaprine  (FLEXERIL ) 5 MG tablet Take 1 tablet (5 mg total) by mouth 2 (two) times daily as needed for muscle spasms. Patient not taking: Reported on 12/26/2023 11/29/23   Vicci Barnie NOVAK, MD  diclofenac  Sodium (VOLTAREN ) 1 % GEL Apply 4 g topically 4 (four) times daily. Patient not taking: Reported on 12/26/2023 06/19/23   Newlin, Enobong, MD  gabapentin  (NEURONTIN ) 300 MG capsule Take 2 capsules (600 mg total) by mouth 2 (two) times daily. 07/07/23   Newlin, Enobong, MD  lidocaine  (XYLOCAINE ) 5 % ointment Apply 1 Application topically as needed. Patient not taking: Reported on 12/26/2023 10/25/23   Thomas, Alicia, MD  Multiple Vitamin (MULTIVITAMIN WITH MINERALS) TABS tablet Take 1 tablet by mouth daily. Patient  not taking: Reported on 11/29/2023    [provider]  ondansetron  (ZOFRAN -ODT) 4 MG disintegrating tablet 4mg  ODT q4 hours prn nausea/vomit 11/14/23   Pollina, Lonni PARAS, MD  pantoprazole  (PROTONIX ) 40 MG tablet Take 1 tablet (40 mg total) by mouth daily. 11/11/23   Jillian Buttery, MD  QUEtiapine  Fumarate 150 MG TABS Take 150 mg by mouth daily as needed. 07/17/23   [provider]  traZODone  (DESYREL ) 150 MG tablet Take 150 mg by mouth at bedtime as needed for sleep.    [provider]    Allergies: Penicillins, Latex, and Tape    Review of Systems  Cardiovascular:  Positive for chest pain.  All other systems reviewed and are negative.   Updated Vital Signs BP 126/82   Pulse 67   Temp 97.6 F (36.4 C) (Oral)   Resp 16   Ht 6' 2 (1.88 m)   Wt 106.6 kg   SpO2 100%   BMI 30.17 kg/m   Physical Exam Vitals and nursing note reviewed.  Constitutional:      General: He is not in acute distress.    Appearance: He is well-developed.  HENT:     Head: Normocephalic and atraumatic.  Eyes:     Conjunctiva/sclera: Conjunctivae normal.  Cardiovascular:     Rate and Rhythm: Normal rate and regular rhythm.     Heart sounds: No murmur heard. Pulmonary:     Effort: Pulmonary effort is normal. No respiratory distress.     Breath sounds: Normal breath sounds.  Abdominal:     Palpations: Abdomen  is soft.     Tenderness: There is no abdominal tenderness.  Musculoskeletal:        General: No swelling.     Cervical back: Neck supple.     Right lower leg: No edema.     Left lower leg: No edema.  Skin:    General: Skin is warm and dry.     Capillary Refill: Capillary refill takes less than 2 seconds.  Neurological:     Mental Status: He is alert.  Psychiatric:        Mood and Affect: Mood normal.     (all labs ordered are listed, but only abnormal results are displayed) Labs Reviewed  CK - Abnormal; Notable for the following components:      Result  Value   Total CK 919 (*)    All other components within normal limits  TROPONIN T, HIGH SENSITIVITY - Abnormal; Notable for the following components:   Troponin T High Sensitivity 26 (*)    All other components within normal limits  TROPONIN T, HIGH SENSITIVITY - Abnormal; Notable for the following components:   Troponin T High Sensitivity 28 (*)    All other components within normal limits  BASIC METABOLIC PANEL WITH GFR  CBC  URINALYSIS, ROUTINE W REFLEX MICROSCOPIC    EKG: None  Radiology: [facility] Chest Port 1 View Result Date: 01/09/2024 EXAM: 1 VIEW(S) XRAY OF THE CHEST 01/09/2024 03:21:44 AM COMPARISON: 12/21/2023 CLINICAL HISTORY: cp FINDINGS: LUNGS AND PLEURA: No focal pulmonary opacity. No pleural effusion. No pneumothorax. HEART AND MEDIASTINUM: No acute abnormality of the cardiac and mediastinal silhouettes. BONES AND SOFT TISSUES: No acute osseous abnormality. IMPRESSION: 1. No acute cardiopulmonary abnormality. Electronically signed by: Evalene Coho MD 01/09/2024 03:56 AM EST RP Workstation: [id number]    Procedures   Medications Ordered in the ED  sodium chloride  0.9 % bolus 1,000 mL (1,000 mLs Intravenous New Bag/Given 01/09/24 0445)  sodium chloride  0.9 % bolus 1,000 mL (1,000 mLs Intravenous New Bag/Given 01/09/24 0601)    Medical Decision Making Amount and/or Complexity of Data Reviewed Labs: ordered. Radiology: ordered.   58 year old male presents to ED for evaluation of chest pain.  On exam, HD stable.  Lung sounds clear bilaterally, no hypoxia.  Abdomen soft and compressible.  Neuroexam at baseline.  Overall nontoxic in appearance.  Will draw basic labs to include CBC, BMP, troponin x 2, EKG, chest x-ray.  Patient also known history of atraumatic rhabdomyolysis due to cocaine use so we will draw CK.  CBC here without leukocytosis or anemia.  Metabolic panel is grossly unremarkable.  CK elevated at 919 however patient given 2 L of fluid for this.  Troponin  26, delta 28.  Patient denies any chest pain at this time.  Chart reviewed, patient did have troponin elevation of 33 and 31 respectively 5 months ago.  EKG is nonischemic and chest x-ray is unremarkable.  At this time patient denies any active chest pain.  Will place ambulatory referral to cardiology for patient.  Patient was advised to follow-up outpatient with cardiology.  Advised to return to ED with any new symptoms such as chest pain or shortness of breath and he voiced understanding.  He had all of his questions answered to his satisfaction.  He is stable to discharge home at this time.   Final diagnoses:  Chest pain, unspecified type    ED Discharge Orders          Ordered    Ambulatory referral to Cardiology  01/09/24 0628               Ruthell Lonni FALCON, PA-C 01/09/24 9366    Lorette Mayo, MD 01/09/24 [phone]  "

## 2024-01-09 NOTE — ED Triage Notes (Signed)
 Complaining of center chest pain that started while he was walking downtown. [place] it is center of the chest and does not radiate. Was given 4 baby asprin by ems. Said that the pain is 10 on the scale of 1-10

## 2024-01-09 NOTE — Discharge Instructions (Signed)
 As discussed, your workup here is reassuring.  Please continue to hydrate yourself at home and discontinue any illicit drug use.  Please follow-up with cardiology.  I have placed an ambulatory referral.  They will call you in the next few days to schedule an appointment.  If you do not hear from them, please reach out to them with the number provided.  If you have any return of symptoms such as chest pain or shortness of breath please return to the ED.

## 2024-01-09 NOTE — ED Notes (Signed)
 Called and placed patient on ccm

## 2024-01-09 NOTE — ED Notes (Signed)
 Pt does not want to wait for fluids to finish before discharge

## 2024-01-14 ENCOUNTER — Other Ambulatory Visit: Payer: MEDICAID

## 2024-01-14 ENCOUNTER — Telehealth: Payer: Self-pay | Admitting: Internal Medicine

## 2024-01-14 ENCOUNTER — Other Ambulatory Visit: Payer: Self-pay

## 2024-01-14 DIAGNOSIS — B181 Chronic viral hepatitis B without delta-agent: Secondary | ICD-10-CM

## 2024-01-14 NOTE — Telephone Encounter (Signed)
 Patient would like to switch from Dr. Mallipeddi to Dr. Floretta.  He has moved back to [place], are you both in agreement with this?

## 2024-01-15 LAB — HEPATITIS B SURFACE ANTIBODY,QUALITATIVE: Hep B S Ab: NONREACTIVE

## 2024-01-15 LAB — HEPATITIS B SURFACE ANTIGEN: Hepatitis B Surface Ag: REACTIVE — AB

## 2024-01-25 ENCOUNTER — Emergency Department (HOSPITAL_COMMUNITY)
Admission: EM | Admit: 2024-01-25 | Discharge: 2024-01-25 | Disposition: A | Discharge status: Home / Self Care | Payer: MEDICAID | Attending: Emergency Medicine | Admitting: Emergency Medicine

## 2024-01-25 ENCOUNTER — Emergency Department (HOSPITAL_COMMUNITY): Payer: MEDICAID

## 2024-01-25 ENCOUNTER — Encounter (HOSPITAL_COMMUNITY): Payer: Self-pay

## 2024-01-25 DIAGNOSIS — F1994 Other psychoactive substance use, unspecified with psychoactive substance-induced mood disorder: Secondary | ICD-10-CM

## 2024-01-25 DIAGNOSIS — F1490 Cocaine use, unspecified, uncomplicated: Secondary | ICD-10-CM | POA: Insufficient documentation

## 2024-01-25 DIAGNOSIS — R7401 Elevation of levels of liver transaminase levels: Secondary | ICD-10-CM | POA: Insufficient documentation

## 2024-01-25 DIAGNOSIS — M545 Low back pain, unspecified: Secondary | ICD-10-CM | POA: Insufficient documentation

## 2024-01-25 DIAGNOSIS — F1990 Other psychoactive substance use, unspecified, uncomplicated: Secondary | ICD-10-CM | POA: Insufficient documentation

## 2024-01-25 DIAGNOSIS — Z21 Asymptomatic human immunodeficiency virus [HIV] infection status: Secondary | ICD-10-CM | POA: Diagnosis not present

## 2024-01-25 DIAGNOSIS — R45851 Suicidal ideations: Secondary | ICD-10-CM | POA: Insufficient documentation

## 2024-01-25 DIAGNOSIS — Z91040 Latex allergy status: Secondary | ICD-10-CM | POA: Insufficient documentation

## 2024-01-25 DIAGNOSIS — R0789 Other chest pain: Secondary | ICD-10-CM | POA: Diagnosis present

## 2024-01-25 DIAGNOSIS — R7989 Other specified abnormal findings of blood chemistry: Secondary | ICD-10-CM | POA: Insufficient documentation

## 2024-01-25 DIAGNOSIS — F1410 Cocaine abuse, uncomplicated: Secondary | ICD-10-CM | POA: Diagnosis not present

## 2024-01-25 DIAGNOSIS — R079 Chest pain, unspecified: Secondary | ICD-10-CM

## 2024-01-25 LAB — URINE DRUG SCREEN
Amphetamines: NEGATIVE
Barbiturates: NEGATIVE
Benzodiazepines: NEGATIVE
Cocaine: POSITIVE — AB
Fentanyl: NEGATIVE
Methadone Scn, Ur: NEGATIVE
Opiates: NEGATIVE
Tetrahydrocannabinol: NEGATIVE

## 2024-01-25 LAB — TROPONIN T, HIGH SENSITIVITY
Troponin T High Sensitivity: 29 ng/L — ABNORMAL HIGH (ref 0–19)
Troponin T High Sensitivity: 27 ng/L — ABNORMAL HIGH (ref 0–19)

## 2024-01-25 LAB — COMPREHENSIVE METABOLIC PANEL WITH GFR
ALT: 49 U/L — ABNORMAL HIGH (ref 0–44)
AST: 59 U/L — ABNORMAL HIGH (ref 15–41)
Albumin: 4.9 g/dL (ref 3.5–5.0)
Alkaline Phosphatase: 125 U/L (ref 38–126)
Anion gap: 11 (ref 5–15)
BUN: 14 mg/dL (ref 6–20)
CO2: 26 mmol/L (ref 22–32)
Calcium: 10.6 mg/dL — ABNORMAL HIGH (ref 8.9–10.3)
Chloride: 102 mmol/L (ref 98–111)
Creatinine, Ser: 0.95 mg/dL (ref 0.61–1.24)
GFR, Estimated: >60 mL/min
Glucose, Bld: 104 mg/dL — ABNORMAL HIGH (ref 70–99)
Potassium: 5 mmol/L (ref 3.5–5.1)
Sodium: 138 mmol/L (ref 135–145)
Total Bilirubin: 0.5 mg/dL (ref 0.0–1.2)
Total Protein: 9.1 g/dL — ABNORMAL HIGH (ref 6.5–8.1)

## 2024-01-25 LAB — ETHANOL: Alcohol, Ethyl (B): <15 mg/dL

## 2024-01-25 MED ORDER — IBUPROFEN 200 MG PO TABS
400.0000 mg | ORAL_TABLET | Freq: Once | ORAL | Status: AC
  Administered 2024-01-25: 400 mg via ORAL
  Filled 2024-01-25: qty 2

## 2024-01-25 MED ORDER — ACETAMINOPHEN 325 MG PO TABS
650.0000 mg | ORAL_TABLET | Freq: Once | ORAL | Status: AC
  Administered 2024-01-25: 650 mg via ORAL
  Filled 2024-01-25: qty 2

## 2024-01-25 NOTE — ED Notes (Signed)
 MD reported to this RN that pt told her he was feeling suicidal. Pt denied to this RN during triage.

## 2024-01-25 NOTE — ED Notes (Signed)
 Pt very cooperative. Pt dressed out in purple scrubs and belongings placed in triage.

## 2024-01-25 NOTE — ED Provider Notes (Signed)
 " [place] [facility]ERGENC[facility] DE[facility]MENT AT [facility] Provider Note   CSN: [id number] Arrival date & time: 01/25/24  9042     Patient presents with: Drug / Alcohol Assessment and Suicidal   Dylan [name] is a 59 y.o. male.   Patient is a 59 year old male with past medical history of HIV on Shiela and polysubstance use presenting to the emergency department with chest pain.  The patient states that he thinks that he smoked bad marijuana this morning and states that shortly after he started to develop some chest pressure.  He states this started about 45 minutes ago and has been constant since.  He states that he initially felt short of breath, lightheaded and sweaty but states that the other symptoms have since resolved.  He states he is also having some pain in his low back that is radiating down his left leg.  He states he does have a history of neuropathy and this feels similar and feels like a different type of pain compared to his chest pain.  Patient also reports that he has been feeling depressed recently and having thoughts of suicide, denies any active plan.  He states that he had a dream about his deceased daughter last night that seemed very real and woke up this morning just wanting to be with her.  The history is provided by the patient.  Drug / Alcohol Assessment      Prior to Admission medications  Medication Sig Start Date End Date Taking? Authorizing Provider  bictegravir-emtricitabine -tenofovir  AF (BIKTARVY ) 50-200-25 MG TABS tablet Take 1 tablet by mouth daily. 07/23/23   Luiz Channel, MD  cyclobenzaprine  (FLEXERIL ) 5 MG tablet Take 1 tablet (5 mg total) by mouth 2 (two) times daily as needed for muscle spasms. 11/29/23   Vicci Barnie NOVAK, MD  diclofenac  Sodium (VOLTAREN ) 1 % GEL Apply 4 g topically 4 (four) times daily. 06/19/23   Newlin, Enobong, MD  gabapentin  (NEURONTIN ) 300 MG capsule Take 2 capsules (600 mg total) by mouth 2 (two) times daily. 07/07/23    Newlin, Enobong, MD  lidocaine  (XYLOCAINE ) 5 % ointment Apply 1 Application topically as needed. 10/25/23   Thomas, Alicia, MD  Multiple Vitamin (MULTIVITAMIN WITH MINERALS) TABS tablet Take 1 tablet by mouth daily.    [provider]  ondansetron  (ZOFRAN -ODT) 4 MG disintegrating tablet 4mg  ODT q4 hours prn nausea/vomit 11/14/23   Pollina, Lonni PARAS, MD  pantoprazole  (PROTONIX ) 40 MG tablet Take 1 tablet (40 mg total) by mouth daily. 11/11/23   Jillian Buttery, MD  QUEtiapine  Fumarate 150 MG TABS Take 150 mg by mouth daily as needed. 07/17/23   [provider]  traZODone  (DESYREL ) 150 MG tablet Take 150 mg by mouth at bedtime as needed for sleep.    [provider]    Allergies: Penicillins, Latex, and Tape    Review of Systems  Updated Vital Signs BP (!) 142/89   Pulse 80   Temp 97.6 F (36.4 C)   Resp 18   Ht 6' 2 (1.88 m)   Wt 106.6 kg   SpO2 98%   BMI 30.17 kg/m   Physical Exam Vitals and nursing note reviewed.  Constitutional:      General: He is not in acute distress.    Appearance: Normal appearance.  HENT:     Head: Normocephalic and atraumatic.     Nose: Nose normal.     Mouth/Throat:     Mouth: Mucous membranes are moist.     Pharynx:  Oropharynx is clear.  Eyes:     Extraocular Movements: Extraocular movements intact.     Conjunctiva/sclera: Conjunctivae normal.  Cardiovascular:     Rate and Rhythm: Normal rate and regular rhythm.     Pulses: Normal pulses.     Heart sounds: Normal heart sounds.  Pulmonary:     Effort: Pulmonary effort is normal.     Breath sounds: Normal breath sounds.  Abdominal:     General: Abdomen is flat.     Palpations: Abdomen is soft.     Tenderness: There is no abdominal tenderness.  Musculoskeletal:     Cervical back: Normal range of motion.     Comments: No chest wall tenderness to palpation No midline back tenderness, mild L-sided paraspinal muscle tenderness, positive straight leg raise on the L   Skin:    General: Skin is warm and dry.  Neurological:     General: No focal deficit present.     Mental Status: He is alert and oriented to person, place, and time.     Sensory: No sensory deficit.     Motor: No weakness (5/5 strength in bilateral hip flexion, knee extension, plantar/dorsiflexion).  Psychiatric:        Behavior: Behavior normal.     Comments: Flat affect, depressed mood     (all labs ordered are listed, but only abnormal results are displayed) Labs Reviewed  COMPREHENSIVE METABOLIC PANEL WITH GFR - Abnormal; Notable for the following components:      Result Value   Glucose, Bld 104 (*)    Calcium  10.6 (*)    Total Protein 9.1 (*)    AST 59 (*)    ALT 49 (*)    All other components within normal limits  URINE DRUG SCREEN - Abnormal; Notable for the following components:   Cocaine POSITIVE (*)    All other components within normal limits  TROPONIN T, HIGH SENSITIVITY - Abnormal; Notable for the following components:   Troponin T High Sensitivity 29 (*)    All other components within normal limits  TROPONIN T, HIGH SENSITIVITY - Abnormal; Notable for the following components:   Troponin T High Sensitivity 27 (*)    All other components within normal limits  ETHANOL  CBC WITH DIFFERENTIAL/PLATELET  CBC WITH DIFFERENTIAL/PLATELET    EKG: EKG Interpretation Date/Time:  Friday January 25 2024 10:35:00 EST Ventricular Rate:  63 PR Interval:  142 QRS Duration:  106 QT Interval:  415 QTC Calculation: 425 R Axis:   79  Text Interpretation: Sinus rhythm Left ventricular hypertrophy PVCs resolved compared to prior EKG Confirmed by Ellouise Fine ([id number]) on 01/25/2024 11:28:10 AM  Radiology: DG Chest 2 View Result Date: 01/25/2024 EXAM: 2 VIEW(S) XRAY OF THE CHEST 01/25/2024 10:59:35 AM COMPARISON: 01/09/2024 CLINICAL HISTORY: chest pain chest pain chest pain chest pain chest pain FINDINGS: LUNGS AND PLEURA: No focal pulmonary opacity. No pleural effusion. No  pneumothorax. HEART AND MEDIASTINUM: No acute abnormality of the cardiac and mediastinal silhouettes. BONES AND SOFT TISSUES: Mild thoracic spine degenerative changes. IMPRESSION: 1. No acute cardiopulmonary pathology. Electronically signed by: Waddell Calk MD 01/25/2024 11:15 AM EST RP Workstation: [id number]     Procedures   Medications Ordered in the ED  acetaminophen  (TYLENOL ) tablet 650 mg (650 mg Oral Given 01/25/24 1023)  ibuprofen  (ADVIL ) tablet 400 mg (400 mg Oral Given 01/25/24 1426)    Clinical Course as of 01/25/24 1712  Fri Jan 25, 2024  1127 Mild transaminitis, troponin mildly elevated, no ischemic EKG  changes. Will need repeat troponin at 2 hours.  [VK]  1201 Cocaine positive in UDS, possibly related to chest pain today. Patient was chest pain free on reassessment.  [VK]  1300 Repeat troponin is flat. Patient medically cleared for psych eval. [VK]  1700 Patient signed out to Dr. Towana pending TTS recs.  [VK]    Clinical Course User Index [VK] Kingsley, Nakeia Calvi K, DO                                 Medical Decision Making This patient presents to the ED with chief complaint(s) of chest pain, back pain, SI with pertinent past medical history of HIV, polysubstance use which further complicates the presenting complaint. The complaint involves an extensive differential diagnosis and also carries with it a high risk of complications and morbidity.    The differential diagnosis includes ACS, arrhythmia, anemia, pneumonia, pneumothorax, pulmonary edema, pleural effusion, cocaine chest pain, substance-induced pain, anxiety/panic attack, patient has equal pulses in his extremities without neurologic deficits making a dissection less likely, considering sciatica, MSK pain, no significant trauma making fracture or dislocation unlikely, no neurologic deficits making cauda equina or other spinal cord etiology unlikely  Additional history obtained: Additional history obtained from  N/A Records reviewed previous admission documents and Primary Care Documents  ED Course and Reassessment: On patient's arrival he is hemodynamically stable in no acute distress.  Patient chest pain, will have EKG and labs including troponin chest x-ray performed.  Patient also endorsed SI to myself, without a plan.  He is agreeable for psychiatry evaluation and does not need IVC at this time.  Independent labs interpretation:  The following labs were independently interpreted: mildly elevated troponin -> flat on repeat, cocaine positive UDS  Independent visualization of imaging: - I independently visualized the following imaging with scope of interpretation limited to determining acute life threatening conditions related to emergency care: CXR, which revealed no acute disease  Consultation: - Consulted or discussed management/test interpretation w/ external professional: TTS  Social Determinants of health: substance use    Amount and/or Complexity of Data Reviewed Labs: ordered. Radiology: ordered.  Risk OTC drugs.       Final diagnoses:  Suicidal ideation  Nonspecific chest pain    ED Discharge Orders     None          Ellouise Richerd POUR, DO 01/25/24 1712  "

## 2024-01-25 NOTE — ED Provider Notes (Signed)
 Signout from Dr. Ellouise.  59 year old male presenting with chest pain followed by thoughts of suicide.  Medical workup completed.  Pending psychiatric evaluation.  Anticipate discharge if psych clears. Physical Exam  BP (!) 142/89   Pulse 80   Temp 97.6 F (36.4 C)   Resp 18   Ht 6' 2 (1.88 m)   Wt 106.6 kg   SpO2 98%   BMI 30.17 kg/m   Physical Exam  Procedures  Procedures  ED Course / MDM   Clinical Course as of 01/26/24 0901  Fri Jan 25, 2024  1127 Mild transaminitis, troponin mildly elevated, no ischemic EKG changes. Will need repeat troponin at 2 hours.  [VK]  1201 Cocaine positive in UDS, possibly related to chest pain today. Patient was chest pain free on reassessment.  [VK]  1300 Repeat troponin is flat. Patient medically cleared for psych eval. [VK]  1700 Patient signed out to Dr. Towana pending TTS recs.  [VK]    Clinical Course User Index [VK] Kingsley, Victoria K, DO   Medical Decision Making Amount and/or Complexity of Data Reviewed Labs: ordered. Radiology: ordered.  Risk OTC drugs.   Patient evaluated by psychiatry he NP Moishe.  They are psychiatrically clearing and recommending outpatient psychiatry resources.       Towana Ozell BROCKS, MD 01/26/24 [id number]

## 2024-01-25 NOTE — ED Triage Notes (Signed)
 Pt presents with c/o smoking some bad marijuana. Pt reports he had some chest pain 30 seconds after smoking it. Pt reports he got the drugs from a new person. Pt alert and oriented, NAD.

## 2024-01-25 NOTE — ED Notes (Signed)
 Pt has a silver watch placed in a patient belonging bag.

## 2024-01-25 NOTE — Consult Note (Cosign Needed)
 Patient seen by psychiatry; initially seen for chest pain and anxiety associated with drug usage and there was some concern for suicidal thoughts.  On assessment, he endorses passive suicidal ideation, no plan or intent for self harm.  He as sobered up, is clear and coherent.  He thoughts are devoid of psychosis, paranoid or delusion.  He is psych cleared with OP psychiatry resources.   Complete psych consult note to follow.

## 2024-01-25 NOTE — ED Notes (Signed)
 [facility] provided pt with resources for outpatient substance abuse treatment and behavior health services.  Chesley Holt, [facility]  01/25/24

## 2024-01-25 NOTE — Consult Note (Incomplete)
 [facility] Psychiatric Consult {CHL [facility] INITIAL OR FOLLOW LE:68184}  Patient Name: .Dylan [name]  MRN: [id number]  DOB: Jun 10, 1965  Consult Order details:  Orders (From admission, onward)     Start     Ordered   01/25/24 1321  CONSULT TO CALL ACT TEAM       Ordering Provider: Ellouise Richerd POUR, DO  Provider:  (Not yet assigned)  Question:  Reason for Consult?  Answer:  Psych consult   01/25/24 1321             Mode of Visit: {Type of visit:31911}    Psychiatry Consult Evaluation  Service Date: January 25, 2024 LOS:  LOS: 0 days  Chief Complaint I got a hold of some bad marijuana.  Primary Psychiatric Diagnoses  *** 2.  *** 3.  ***  Assessment  Dylan [name] is a 59 y.o. male admitted: {CHL BH Medical or Presented to ZI:68182}qnm 01/25/2024  9:59 AM for ***. He carries the psychiatric diagnoses of *** and has a past medical history of  ***.   His current presentation of *** is most consistent with ***. He meets criteria for *** based on ***.  Current outpatient psychotropic medications include *** and historically he has had a *** response to these medications. He was *** compliant with medications prior to admission as evidenced by ***. On initial examination, patient ***. Please see plan below for detailed recommendations.   Diagnoses:  Active Hospital problems: Active Problems:   * No active hospital problems. *    Plan   ## Psychiatric Medication Recommendations:  ***  ## Medical Decision Making Capacity: {CHL BH MEDICAL DECISION MAKING CAPACITY:31818}  ## Further Work-up:  -- *** {CHLmacgeneralandspecificworkuprecs:31821} -- most recent EKG on *** had QtC of *** -- Pertinent labwork reviewed earlier this admission includes: ***   ## Disposition:-- {CHLmaccldispo:31820}  ## Behavioral / Environmental: -{CHLmacbehavioralenvironmental2:31847}    ## Safety and Observation Level:  - Based on my clinical evaluation, I estimate the patient to be  at *** risk of self harm in the current setting. - At this time, we recommend  {CHL BH SUICIDE OBSERVATION LEVEL:31850}. This decision is based on my review of the chart including patient's history and current presentation, interview of the patient, mental status examination, and consideration of suicide risk including evaluating suicidal ideation, plan, intent, suicidal or self-harm behaviors, risk factors, and protective factors. This judgment is based on our ability to directly address suicide risk, implement suicide prevention strategies, and develop a safety plan while the patient is in the clinical setting. Please contact our team if there is a concern that risk level has changed.  CSSR Risk Category:C-SSRS RISK CATEGORY: No Risk  Suicide Risk Assessment: Patient has following modifiable risk factors for suicide: {CHLmacmodifiablesuicideriskfactors:31822}, which we are addressing by ***. Patient has following non-modifiable or demographic risk factors for suicide: {CHLmacnonmodifiablesuicideriskfactors:31823} Patient has the following protective factors against suicide: {CHLmacprotectivefactors:31824}  Thank you for this consult request. Recommendations have been communicated to the primary team.  We will *** at this time.   Bernadette FORBES Barefoot, NP       History of Present Illness  Relevant Aspects of Hospital ED Course:  Admitted on 01/25/2024 for   Patient Report:  Patient states he was feeling depressed earlier today, reports he had a rough night after dreaming about his 73 year old daughter that passed away.  He reports he decided to get some weed to settle his nerves. States after smoking it he didn't  feel right and got sick immedicatel  He endorses passive suicidal ideations, of going to sleep and not waking up. Started the think that   Psych ROS:  Depression: yes Anxiety:  yes Mania (lifetime and current): *** Psychosis: (lifetime and current): ***  Collateral information:   Contacted *** at *** on ***  ROS   Psychiatric and Social History  Psychiatric History:  Information collected from ***  Prev Dx/Sx: *** Current Psych Provider: *** Home Meds (current): *** Previous Med Trials: hydroxyzine  for anxiety that did not help Therapy: currently enrolled in grief counseling, via E[facility].    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[employer], Peer support Psychologist, Forensic Hx: denies Living Situation: lives with his roommate Spiritual Hx: :Christian: Access to weapons/lethal means: denies   Substance History Alcohol: yes  Type of alcohol beer Last Drink last night Number of drinks per day denies History of alcohol withdrawal seizures denies History of DT's denies Tobacco: he vapes nicotine  Illicit drugs: cocaine, used to sniff it, currently using it Prescription drug abuse: denies Rehab hx: denies  Exam Findings  Physical Exam:  Vital Signs:  Temp:  [97.5 F (36.4 C)-97.6 F (36.4 C)] 97.6 F (36.4 C) (01/16 1320) Pulse Rate:  [78-80] 80 (01/16 1320) Resp:  [18-19] 18 (01/16 1320) BP: (142-154)/(89-94) 142/89 (01/16 1320) SpO2:  [97 %-98 %] 98 % (01/16 1320) Weight:  [106.6 kg] 106.6 kg (01/16 1449) Blood pressure (!) 142/89, pulse 80, temperature 97.6 F (36.4 C), resp. rate 18, height 6' 2 (1.88 m), weight 106.6 kg, SpO2 98%. Body mass index is 30.17 kg/m.  Physical Exam  Mental Status Exam: General Appearance: Neat  Orientation:  Full (Time, Place, and Person)  Memory:  Immediate;   Good Recent;   Good Remote;   Good  Concentration:  Concentration: Good and Attention Span: Good  Recall:  Good  Attention  Good  Eye Contact:  Good  Speech:   Clear and Coherent  Language:  Good  Volume:  Normal  Mood: Euthymic   Affect:  {Affect (PAA):22687}  Thought Process:  {Thought Process (PAA):22688}  Thought Content:  {Thought Content:22690}  Suicidal Thoughts:  {ST/HT (PAA):22692}  Homicidal Thoughts:  {ST/HT (PAA):22692}  Judgement:  {Judgement (PAA):22694}  Insight:  {Insight (PAA):22695}  Psychomotor Activity:  {Psychomotor (PAA):22696}  Akathisia:  {BHH YES OR NO:22294}  Fund of Knowledge:  {BHH GOOD/FAIR/POOR:22877}      Assets:  {Assets (PAA):22698}  Cognition:  {chl bhh cognition:304700322}  ADL's:  {BHH JIO'D:77709}  AIMS (if indicated):        Other History   These have been pulled in through the EMR, reviewed, and updated if appropriate.  Family History:  The patient's family history includes CAD in his mother; Colon cancer (age of onset: 67) in his maternal aunt; Diabetes in his father and maternal aunt; Heart attack in his maternal uncle; Heart disease in his maternal uncle; Stroke in his father.  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   Unilateral primary osteoarthritis, left knee 12/14/2021    Surgical History: Past Surgical History:  Procedure Laterality Date   ANAL FISTULOTOMY  07/27/2023   Procedure: ANAL FISTULOTOMY;  Surgeon: Debby Hila, MD;  Location: [facility];  Service: General;;   COLONOSCOPY     FOOT ARTHRODESIS Right 09/17/2019   Procedure: FUSION RIGHT LISFRANC JOINT;  Surgeon: Harden Jerona GAILS, MD;   Location: [facility] OR;  Service: Orthopedics;  Laterality: Right;   FOOT ARTHRODESIS Right 09/2019   HEMORROIDECTOMY     HIGH RESOLUTION ANOSCOPY N/A 07/27/2023   Procedure: ANOSCOPY, HIGH RESOLUTION;  Surgeon: Debby Hila, MD;  Location: [facility];  Service: General;  Laterality: N/A;   IR FLUORO GUIDE CV LINE RIGHT  02/22/2017   IR US  GUIDE VASC ACCESS RIGHT  02/22/2017   LASER ABLATION CONDOLAMATA N/A 10/25/2023   Procedure: ABLATION, CONDYLOMA, USING LASER;  Surgeon: Debby Hila, MD;  Location: [facility] ORS;  Service: General;  Laterality: N/A;  LASER ABLATION AND BIOPSY AIN   RECTAL BIOPSY N/A 07/27/2023   Procedure: BIOPSY AND ABLATION, RECTUM;  Surgeon: Debby Hila, MD;  Location: [facility];  Service: General;  Laterality: N/A;   TOTAL KNEE ARTHROPLASTY Left 12/14/2021   Procedure: LEFT TOTAL KNEE ARTHROPLASTY;  Surgeon: Harden Jerona GAILS, MD;  Location: [facility] OR;  Service: Orthopedics;  Laterality: Left;   TUMOR REMOVAL     From Chest     Medications:  Current Medications[1]  Allergies: Allergies[2]  Bernadette FORBES Barefoot, NP      [1] No current facility-administered medications for this encounter.  Current Outpatient Medications:    bictegravir-emtricitabine -tenofovir  AF (BIKTARVY ) 50-200-25 MG TABS tablet, Take 1 tablet by mouth daily., Disp: 30 tablet, Rfl: 11   cyclobenzaprine  (FLEXERIL ) 5 MG tablet, Take 1 tablet (5 mg total) by mouth 2 (two) times daily as needed for muscle spasms., Disp: 20 tablet, Rfl: 0   diclofenac  Sodium (VOLTAREN ) 1 % GEL, Apply 4 g topically 4 (four) times daily., Disp: 100 g, Rfl: 1   gabapentin  (NEURONTIN ) 300 MG capsule, Take 2 capsules (600 mg total) by mouth 2 (two) times daily., Disp: 360 capsule, Rfl: 1   lidocaine  (XYLOCAINE ) 5 % ointment, Apply 1 Application topically as needed., Disp: 35.44 g, Rfl: 0   Multiple Vitamin (MULTIVITAMIN WITH MINERALS) TABS tablet, Take 1 tablet by mouth daily., Disp: , Rfl:     ondansetron  (ZOFRAN -ODT) 4 MG disintegrating tablet, 4mg  ODT q4 hours prn nausea/vomit, Disp: 10 tablet, Rfl: 0   pantoprazole  (PROTONIX ) 40 MG tablet, Take 1 tablet (40 mg total) by mouth daily., Disp: 30 tablet, Rfl: 0   QUEtiapine  Fumarate 150 MG TABS, Take 150 mg by mouth daily as needed., Disp: , Rfl:    traZODone  (DESYREL ) 150 MG tablet, Take 150 mg by mouth at bedtime as needed for sleep., Disp: , Rfl:  [2]  Allergies Allergen Reactions   Penicillins Other (See Comments)    Reaction type/severity unknown, childhood allergy; pt cannot recall all symptoms   Latex Rash   Tape Rash

## 2024-01-25 NOTE — ED Notes (Signed)
 Pt stating he wants to go home. Pt denies he was ever suicidal, states I was just high when I got here

## 2024-01-27 DIAGNOSIS — F1490 Cocaine use, unspecified, uncomplicated: Secondary | ICD-10-CM | POA: Insufficient documentation

## 2024-01-28 ENCOUNTER — Other Ambulatory Visit: Payer: Self-pay | Admitting: Pharmacist

## 2024-01-28 ENCOUNTER — Other Ambulatory Visit: Payer: Self-pay

## 2024-01-28 ENCOUNTER — Encounter: Payer: Self-pay | Admitting: Internal Medicine

## 2024-01-28 ENCOUNTER — Ambulatory Visit: Payer: MEDICAID | Admitting: Internal Medicine

## 2024-01-28 ENCOUNTER — Other Ambulatory Visit (HOSPITAL_COMMUNITY): Payer: Self-pay

## 2024-01-28 VITALS — BP 127/71 | HR 59 | Temp 98.8°F | Ht 74.0 in | Wt 234.0 lb

## 2024-01-28 DIAGNOSIS — G629 Polyneuropathy, unspecified: Secondary | ICD-10-CM | POA: Diagnosis not present

## 2024-01-28 DIAGNOSIS — B181 Chronic viral hepatitis B without delta-agent: Secondary | ICD-10-CM | POA: Diagnosis not present

## 2024-01-28 DIAGNOSIS — R85619 Unspecified abnormal cytological findings in specimens from anus: Secondary | ICD-10-CM | POA: Diagnosis not present

## 2024-01-28 DIAGNOSIS — B20 Human immunodeficiency virus [HIV] disease: Secondary | ICD-10-CM | POA: Diagnosis not present

## 2024-01-28 DIAGNOSIS — Z79899 Other long term (current) drug therapy: Secondary | ICD-10-CM | POA: Diagnosis not present

## 2024-01-28 MED ORDER — GABAPENTIN 300 MG PO CAPS
600.0000 mg | ORAL_CAPSULE | Freq: Three times a day (TID) | ORAL | 11 refills | Status: AC
  Filled 2024-01-28 – 2024-02-04 (×2): qty 180, 30d supply, fill #0

## 2024-01-28 MED ORDER — BICTEGRAVIR-EMTRICITAB-TENOFOV 50-200-25 MG PO TABS
1.0000 | ORAL_TABLET | Freq: Every day | ORAL | 11 refills | Status: AC
  Filled 2024-01-28: qty 30, 30d supply, fill #0

## 2024-01-28 NOTE — Progress Notes (Signed)
 Specialty Pharmacy Initiation Note   Dylan [name] is a 59 y.o. male who will be followed by the specialty pharmacy service for RxSp HIV    Review of administration, indication, effectiveness, safety, potential side effects, storage/disposable, and missed dose instructions occurred today for patient's specialty medication(s) Bictegravir-Emtricitab-Tenofov (BIKTARVY )     Patient/Caregiver did not have any additional questions or concerns.   Patient's therapy is appropriate to: Continue    Goals Addressed             This Visit's Progress    Achieve Undetectable HIV Viral Load < 20       Patient is on track. Patient will maintain adherence      Comply with lab assessments       Patient is on track. Patient will adhere to provider and/or lab appointments      Maintain optimal adherence to therapy       Patient is on track. Patient will maintain adherence         Alan JINNY Geralds Specialty Pharmacist

## 2024-01-28 NOTE — Progress Notes (Signed)
 Specialty Pharmacy Initial Fill Coordination Note  Laterrance Nauta is a 59 y.o. male contacted today regarding initial fill of specialty medication(s) Bictegravir-Emtricitab-Tenofov (BIKTARVY )   Patient requested Delivery   Delivery date: 01/30/24   Verified address: [address] [place] [address]   Medication will be filled on: 01/28/24   Patient is aware of $0 copayment.

## 2024-01-28 NOTE — Progress Notes (Unsigned)
 "     RFV: follow up for hiv disease  Patient ID: Faris Coolman, male   DOB: [birth year], 59 y.o.   MRN: [id number]  HPI Naithen with HIV disease, Cd 4 count of 704/VL<20 ( July 2025) currently on biktarvy , but he reports that he lost his biktarvy  bottle. Out of meds for the past 5 days.Takes gabapentin  for lower extremity neuropathy. Still has some symptoms associated with it  Outpatient Encounter Medications as of 01/28/2024  Medication Sig   bictegravir-emtricitabine -tenofovir  AF (BIKTARVY ) 50-200-25 MG TABS tablet Take 1 tablet by mouth daily.   cyclobenzaprine  (FLEXERIL ) 5 MG tablet Take 1 tablet (5 mg total) by mouth 2 (two) times daily as needed for muscle spasms.   diclofenac  Sodium (VOLTAREN ) 1 % GEL Apply 4 g topically 4 (four) times daily.   gabapentin  (NEURONTIN ) 300 MG capsule Take 2 capsules (600 mg total) by mouth 2 (two) times daily.   lidocaine  (XYLOCAINE ) 5 % ointment Apply 1 Application topically as needed.   Multiple Vitamin (MULTIVITAMIN WITH MINERALS) TABS tablet Take 1 tablet by mouth daily.   ondansetron  (ZOFRAN -ODT) 4 MG disintegrating tablet 4mg  ODT q4 hours prn nausea/vomit   pantoprazole  (PROTONIX ) 40 MG tablet Take 1 tablet (40 mg total) by mouth daily.   QUEtiapine  Fumarate 150 MG TABS Take 150 mg by mouth daily as needed.   traZODone  (DESYREL ) 150 MG tablet Take 150 mg by mouth at bedtime as needed for sleep.   No facility-administered encounter medications on file as of 01/28/2024.     Patient Active Problem List   Diagnosis Date Noted   Cocaine use 01/27/2024   Syncope 12/26/2023   Cocaine abuse (HCC) 11/07/2023   MDD (major depressive disorder), recurrent, severe, with psychosis (HCC) 03/12/2023   Transaminitis 01/04/2023   Bipolar 1 disorder, depressed (HCC) 12/05/2022   Non-traumatic rhabdomyolysis 12/04/2022   Weakness 11/03/2022   Cough 11/03/2022   Essential hypertension 11/03/2022   Peripheral neuropathy 11/03/2022   Bipolar disorder (HCC)  08/27/2022   Cannabis use disorder 08/27/2022   Bipolar disorder, most recent episode depressed (HCC) 08/26/2022   Suicidal ideation 08/26/2022   Rhabdomyolysis 07/01/2022   Non-ST elevated myocardial infarction (HCC) 06/03/2022   Dyslipidemia 06/02/2022   Polysubstance abuse (HCC) 06/02/2022   Obesity (BMI 30-39.9) 06/02/2022   Alcohol use disorder 04/12/2022   PTSD (post-traumatic stress disorder) 04/12/2022   S/P total knee arthroplasty, left 12/14/2021   Total knee replacement status, left 12/14/2021   Chronic fatigue 11/16/2021   Abnormal EKG 11/16/2021   Overweight (BMI 25.0-29.9) 11/16/2021   Elevated LFTs 07/03/2021   Right foot ulcer (HCC) 07/03/2021   Onychomycosis 04/19/2021   Stimulant use disorder 03/16/2021   Nodular radiologic density 02/02/2021   Lesion of liver 02/02/2021   Liver disease 02/02/2021   Vitamin D  deficiency 01/18/2021   Methamphetamine abuse (HCC) 01/17/2021   Lesion of pancreas 01/17/2021   Substance induced mood disorder (HCC) 10/26/2020   Fatty liver 03/09/2020   Generalized anxiety disorder 02/03/2020   Lisfranc dislocation, right, initial encounter    Prediabetes 07/22/2019   Tobacco use disorder 07/10/2018   AKI (acute kidney injury)    Chronic 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 coma (HCC)    Acute hepatitis 02/19/2017   Degenerative disc disease 08/21/2011   HIV disease (HCC) 08/17/2011     Health Maintenance Due  Topic Date Due   Hepatitis B Vaccines 19-59 Average Risk (1 of 3 - 19+ 3-dose series) 08/01/1984   Zoster Vaccines- Shingrix (1  of 2) Never done   Pneumococcal Vaccine: 50+ Years (4 of 4 - PCV20 or PCV21) 06/20/2022   COVID-19 Vaccine (5 - 2025-26 season) 09/10/2023     Review of Systems Review of Systems  Constitutional: Negative for fever, chills, diaphoresis, activity change, appetite change, fatigue and unexpected weight change.  HENT: Negative for congestion, sore throat, rhinorrhea, sneezing,  trouble swallowing and sinus pressure.  Eyes: Negative for photophobia and visual disturbance.  Respiratory: Negative for cough, chest tightness, shortness of breath, wheezing and stridor.  Cardiovascular: Negative for chest pain, palpitations and leg swelling.  Gastrointestinal: Negative for nausea, vomiting, abdominal pain, diarrhea, constipation, blood in stool, abdominal distention and anal bleeding.  Genitourinary: Negative for dysuria, hematuria, flank pain and difficulty urinating.  Musculoskeletal: Negative for myalgias, back pain, joint swelling, arthralgias and gait problem.  Skin: Negative for color change, pallor, rash and wound.  Neurological: Negative for dizziness, tremors, weakness and light-headedness. +neuropathy to feet Hematological: Negative for adenopathy. Does not bruise/bleed easily.  Psychiatric/Behavioral: Negative for behavioral problems, confusion, sleep disturbance, dysphoric mood, decreased concentration and agitation.   Physical Exam   BP 127/71   Pulse (!) 59   Temp 98.8 F (37.1 C) (Oral)   Ht 6' 2 (1.88 m)   Wt 234 lb (106.1 kg)   SpO2 98%   BMI 30.04 kg/m   Physical Exam  Constitutional: He is oriented to person, place, and time. He appears well-developed and well-nourished. No distress.  HENT:  Mouth/Throat: Oropharynx is clear and moist. No oropharyngeal exudate.  Cardiovascular: Normal rate, regular rhythm and normal heart sounds. Exam reveals no gallop and no friction rub.  No murmur heard.  Pulmonary/Chest: Effort normal and breath sounds normal. No respiratory distress. He has no wheezes.  Abdominal: Soft. Bowel sounds are normal. He exhibits no distension. There is no tenderness.  Lymphadenopathy:  He has no cervical adenopathy.  Neurological: He is alert and oriented to person, place, and time.  Skin: Skin is warm and dry. No rash noted. No erythema.  Psychiatric: He has a normal mood and affect. His behavior is normal.   Lab Results   Component Value Date   CD4TCELL 35 07/23/2023   Lab Results  Component Value Date   CD4TABS 704 07/23/2023   CD4TABS 749 02/14/2023   CD4TABS 716 01/04/2023   Lab Results  Component Value Date   HIV1RNAQUANT <20 DETECTED (A) 07/23/2023   Lab Results  Component Value Date   HEPBSAB NON-REACTIVE 01/14/2024   Lab Results  Component Value Date   LABRPR NON-REACTIVE 07/23/2023    CBC Lab Results  Component Value Date   WBC 10.2 01/09/2024   RBC 4.90 01/09/2024   HGB 14.9 01/09/2024   HCT 44.5 01/09/2024   PLT 214 01/09/2024   MCV 90.8 01/09/2024   MCH 30.4 01/09/2024   MCHC 33.5 01/09/2024   RDW 13.1 01/09/2024   LYMPHSABS 2.9 12/26/2023   MONOABS 0.7 12/26/2023   EOSABS 0.1 12/26/2023    BMET Lab Results  Component Value Date   NA 138 01/25/2024   K 5.0 01/25/2024   CL 102 01/25/2024   CO2 26 01/25/2024   GLUCOSE 104 (H) 01/25/2024   BUN 14 01/25/2024   CREATININE 0.95 01/25/2024   CALCIUM  10.6 (H) 01/25/2024   GFRNONAA >60 01/25/2024   GFRAA 92 07/06/2020      Assessment and Plan  Hiv  Chronic hep b Increase gabapentin  -- 600mg  tid  Abnormal pap = has had 2 procedures -- next eval  6 months  "

## 2024-01-29 ENCOUNTER — Other Ambulatory Visit: Payer: Self-pay

## 2024-01-29 LAB — T-HELPER CELL (CD4) - (RCID CLINIC ONLY)
CD4 % Helper T Cell: 39 % (ref 33–65)
CD4 T Cell Abs: 734 /uL (ref 400–1790)

## 2024-01-30 LAB — HEPATITIS B DNA, ULTRAQUANTITATIVE, PCR
Hepatitis B DNA: NOT DETECTED [IU]/mL
Hepatitis B virus DNA: NOT DETECTED {Log_IU}/mL

## 2024-01-30 LAB — HIV-1 RNA QUANT-NO REFLEX-BLD
HIV 1 RNA Quant: NOT DETECTED {copies}/mL
HIV-1 RNA Quant, Log: NOT DETECTED {Log_copies}/mL

## 2024-01-30 LAB — SYPHILIS: RPR W/REFLEX TO RPR TITER AND TREPONEMAL ANTIBODIES, TRADITIONAL SCREENING AND DIAGNOSIS ALGORITHM: RPR Ser Ql: NONREACTIVE

## 2024-01-31 ENCOUNTER — Other Ambulatory Visit: Payer: Self-pay

## 2024-02-04 ENCOUNTER — Other Ambulatory Visit: Payer: Self-pay

## 2024-02-04 ENCOUNTER — Other Ambulatory Visit (HOSPITAL_COMMUNITY): Payer: Self-pay

## 2024-02-04 NOTE — Progress Notes (Incomplete)
 " Cardiology Office Note:  .   Date:  02/04/2024  ID:  Dylan [name], DOB 25-May-1965, MRN [id number] PCP: Delbert Clam, MD  [facility] Providers Cardiologist:  None { Chief Complaint: No chief complaint on file.   History of Present Illness: .    Dylan [name] is a 59 y.o. male with a PMH of HLD, HIV on HAART, polysubstance abuse (cocaine, tobacco), bipolar disorder and recurrent rhabdomyolysis who presents for follow-up.   Pre-Chart Notes: - Recent ED visit on 01/09/2024 for chest pain.  Troponins ruled out and ECG was nonischemic. - Last seen by cardiology in 2024 for management of HLD.  Was referred for PCSK9 given intolerance to statins in the setting of rhabdo, but it does not appear that he was ever started on this medication.  Discussed the use of AI scribe software for clinical note transcription with the patient, who gave verbal consent to proceed.  History of Present Illness       Studies Reviewed: SABRA    EKG: ***       Cardiac Studies & Procedures   ______________________________________________________________________________________________     ECHOCARDIOGRAM  ECHOCARDIOGRAM COMPLETE 12/26/2023  Narrative ECHOCARDIOGRAM REPORT    Patient Name:   Dylan [name] Date of Exam: 12/26/2023 Medical Rec #:  [id number]     Height:       74.0 in Accession #:    [id number]    Weight:       235.0 lb Date of Birth:  30-Oct-1965     BSA:          2.328 m Patient Age:    58 years      BP:           121/76 mmHg Patient Gender: M             HR:           60 bpm. Exam Location:  Inpatient  Procedure: 2D Echo, Cardiac Doppler and Color Doppler (Both Spectral and Color Flow Doppler were utilized during procedure).  Indications:    Syncope  History:        Patient has prior history of Echocardiogram examinations, most recent 06/02/2022.  Sonographer:    Philomena Daring Referring Phys: [id number] CAROLE N HALL  IMPRESSIONS   1. Left ventricular ejection  fraction, by estimation, is 60 to 65%. The left ventricle has normal function. The left ventricle has no regional wall motion abnormalities. There is mild concentric left ventricular hypertrophy. Left ventricular diastolic parameters were normal. 2. Right ventricular systolic function is normal. The right ventricular size is normal. 3. The mitral valve is normal in structure. Trivial mitral valve regurgitation. No evidence of mitral stenosis. 4. The aortic valve is tricuspid. There is mild calcification of the aortic valve. Aortic valve regurgitation is not visualized. No aortic stenosis is present. 5. The inferior vena cava is normal in size with greater than 50% respiratory variability, suggesting right atrial pressure of 3 mmHg.  FINDINGS Left Ventricle: Left ventricular ejection fraction, by estimation, is 60 to 65%. The left ventricle has normal function. The left ventricle has no regional wall motion abnormalities. The left ventricular internal cavity size was normal in size. There is mild concentric left ventricular hypertrophy. Left ventricular diastolic parameters were normal.  Right Ventricle: The right ventricular size is normal. No increase in right ventricular wall thickness. Right ventricular systolic function is normal.  Left Atrium: Left atrial size was normal in size.  Right Atrium: Right atrial size was normal  in size.  Pericardium: There is no evidence of pericardial effusion.  Mitral Valve: The mitral valve is normal in structure. Trivial mitral valve regurgitation. No evidence of mitral valve stenosis.  Tricuspid Valve: The tricuspid valve is normal in structure. Tricuspid valve regurgitation is trivial. No evidence of tricuspid stenosis.  Aortic Valve: The aortic valve is tricuspid. There is mild calcification of the aortic valve. Aortic valve regurgitation is not visualized. No aortic stenosis is present.  Pulmonic Valve: The pulmonic valve was normal in structure.  Pulmonic valve regurgitation is trivial. No evidence of pulmonic stenosis.  Aorta: The aortic root is normal in size and structure.  Venous: The inferior vena cava is normal in size with greater than 50% respiratory variability, suggesting right atrial pressure of 3 mmHg.  IAS/Shunts: No atrial level shunt detected by color flow Doppler.   LEFT VENTRICLE PLAX 2D LVIDd:         5.10 cm   Diastology LVIDs:         3.40 cm   LV e' medial:    7.94 cm/s LV PW:         1.20 cm   LV E/e' medial:  6.9 LV IVS:        0.90 cm   LV e' lateral:   8.59 cm/s LVOT diam:     2.10 cm   LV E/e' lateral: 6.4 LV SV:         61 LV SV Index:   26 LVOT Area:     3.46 cm   RIGHT VENTRICLE             IVC RV Basal diam:  3.40 cm     IVC diam: 1.70 cm RV S prime:     10.13 cm/s TAPSE (M-mode): 1.9 cm  LEFT ATRIUM             Index        RIGHT ATRIUM           Index LA diam:        3.40 cm 1.46 cm/m   RA Area:     17.10 cm LA Vol (A2C):   51.6 ml 22.17 ml/m  RA Volume:   43.00 ml  18.47 ml/m LA Vol (A4C):   54.1 ml 23.24 ml/m LA Biplane Vol: 53.7 ml 23.07 ml/m AORTIC VALVE LVOT Vmax:   87.40 cm/s LVOT Vmean:  56.100 cm/s LVOT VTI:    0.175 m  AORTA Ao Root diam: 2.80 cm Ao Asc diam:  3.20 cm  MITRAL VALVE               TRICUSPID VALVE MV Area (PHT): 3.56 cm    TR Peak grad:   16.6 mmHg MV Decel Time: 213 msec    TR Vmax:        204.00 cm/s MV E velocity: 54.80 cm/s MV A velocity: 66.80 cm/s  SHUNTS MV E/A ratio:  0.82        Systemic VTI:  0.18 m Systemic Diam: 2.10 cm  Toribio Fuel MD Electronically signed by Toribio Fuel MD Signature Date/Time: 12/26/2023/7:54:04 PM    Final          ______________________________________________________________________________________________      Results   Risk Assessment/Calculations:    {Does this patient have ATRIAL FIBRILLATION?:[phone]} No BP recorded.  {Refresh Note OR Click here to enter BP  :1}***         Physical Exam:    VS:  There were no vitals  taken for this visit. ***    Wt Readings from Last 3 Encounters:  01/28/24 234 lb (106.1 kg)  01/25/24 235 lb (106.6 kg)  01/09/24 235 lb (106.6 kg)     GEN: Well nourished, well developed, in no acute distress NECK: No JVD; No carotid bruits CARDIAC: ***RRR, no murmurs, rubs, gallops RESPIRATORY:  Clear to auscultation without rales, wheezing or rhonchi  ABDOMEN: Soft, non-tender, non-distended, normal bowel sounds EXTREMITIES:  Warm and well perfused, no edema; No deformity, 2+ radial pulses PSYCH: Normal mood and affect   ASSESSMENT AND PLAN: .     #Chest Pain CCTA***   #HLD Pharm.D. referral for PCSK9       {Are you ordering a CV Procedure (e.g. stress test, cath, DCCV, TEE, etc)?   Press F2        :[id number]}   This note was written with the assistance of a dictation microphone or AI dictation software. Please excuse any typos or grammatical errors.   Signed, Georganna Archer, MD  02/04/2024 9:03 AM    [facility] "

## 2024-02-05 ENCOUNTER — Ambulatory Visit: Payer: MEDICAID | Admitting: Student in an Organized Health Care Education/Training Program

## 2024-02-06 ENCOUNTER — Other Ambulatory Visit: Payer: Self-pay

## 2024-02-07 ENCOUNTER — Other Ambulatory Visit: Payer: Self-pay

## 2024-03-12 ENCOUNTER — Ambulatory Visit: Payer: MEDICAID | Admitting: Student in an Organized Health Care Education/Training Program
